# Patient Record
Sex: Female | Born: 1975 | Race: Black or African American | Hispanic: No | Marital: Single | Smoking: Current every day smoker
Health system: Southern US, Community
[De-identification: ages and names within clinical notes are randomized; demographics above are authoritative.]

## PROBLEM LIST (undated history)

## (undated) DIAGNOSIS — D259 Leiomyoma of uterus, unspecified: Secondary | ICD-10-CM

## (undated) DIAGNOSIS — O3410 Maternal care for benign tumor of corpus uteri, unspecified trimester: Secondary | ICD-10-CM

## (undated) DIAGNOSIS — I471 Supraventricular tachycardia, unspecified: Secondary | ICD-10-CM

## (undated) DIAGNOSIS — D219 Benign neoplasm of connective and other soft tissue, unspecified: Secondary | ICD-10-CM

## (undated) DIAGNOSIS — I10 Essential (primary) hypertension: Secondary | ICD-10-CM

## (undated) DIAGNOSIS — D500 Iron deficiency anemia secondary to blood loss (chronic): Secondary | ICD-10-CM

## (undated) DIAGNOSIS — I509 Heart failure, unspecified: Secondary | ICD-10-CM

## (undated) HISTORY — DX: Leiomyoma of uterus, unspecified: D25.9

## (undated) HISTORY — PX: OTHER SURGICAL HISTORY: SHX169

## (undated) HISTORY — DX: Heart failure, unspecified: I50.9

---
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
## 2020-05-01 ENCOUNTER — Other Ambulatory Visit: Payer: Self-pay

## 2020-05-01 ENCOUNTER — Emergency Department (HOSPITAL_COMMUNITY): Payer: Medicaid Other

## 2020-05-01 ENCOUNTER — Encounter (HOSPITAL_COMMUNITY): Payer: Self-pay | Admitting: Emergency Medicine

## 2020-05-01 ENCOUNTER — Inpatient Hospital Stay (HOSPITAL_COMMUNITY)
Admission: EM | Admit: 2020-05-01 | Discharge: 2020-05-03 | DRG: 308 | Disposition: A | Discharge status: Home / Self Care | Payer: Medicaid Other | Attending: Internal Medicine | Admitting: Internal Medicine

## 2020-05-01 DIAGNOSIS — I313 Pericardial effusion (noninflammatory): Secondary | ICD-10-CM | POA: Diagnosis present

## 2020-05-01 DIAGNOSIS — Z20822 Contact with and (suspected) exposure to covid-19: Secondary | ICD-10-CM | POA: Diagnosis present

## 2020-05-01 DIAGNOSIS — I161 Hypertensive emergency: Secondary | ICD-10-CM | POA: Diagnosis present

## 2020-05-01 DIAGNOSIS — N920 Excessive and frequent menstruation with regular cycle: Secondary | ICD-10-CM | POA: Diagnosis present

## 2020-05-01 DIAGNOSIS — I5021 Acute systolic (congestive) heart failure: Secondary | ICD-10-CM | POA: Diagnosis not present

## 2020-05-01 DIAGNOSIS — I5031 Acute diastolic (congestive) heart failure: Secondary | ICD-10-CM | POA: Diagnosis not present

## 2020-05-01 DIAGNOSIS — I110 Hypertensive heart disease with heart failure: Secondary | ICD-10-CM | POA: Diagnosis present

## 2020-05-01 DIAGNOSIS — R609 Edema, unspecified: Secondary | ICD-10-CM | POA: Diagnosis not present

## 2020-05-01 DIAGNOSIS — I472 Ventricular tachycardia: Secondary | ICD-10-CM | POA: Diagnosis present

## 2020-05-01 DIAGNOSIS — I509 Heart failure, unspecified: Secondary | ICD-10-CM | POA: Diagnosis not present

## 2020-05-01 DIAGNOSIS — D62 Acute posthemorrhagic anemia: Secondary | ICD-10-CM

## 2020-05-01 DIAGNOSIS — I10 Essential (primary) hypertension: Secondary | ICD-10-CM | POA: Diagnosis not present

## 2020-05-01 DIAGNOSIS — I471 Supraventricular tachycardia, unspecified: Secondary | ICD-10-CM

## 2020-05-01 DIAGNOSIS — D649 Anemia, unspecified: Secondary | ICD-10-CM | POA: Diagnosis not present

## 2020-05-01 DIAGNOSIS — I5041 Acute combined systolic (congestive) and diastolic (congestive) heart failure: Secondary | ICD-10-CM | POA: Diagnosis present

## 2020-05-01 DIAGNOSIS — D500 Iron deficiency anemia secondary to blood loss (chronic): Secondary | ICD-10-CM | POA: Diagnosis present

## 2020-05-01 DIAGNOSIS — D259 Leiomyoma of uterus, unspecified: Secondary | ICD-10-CM | POA: Diagnosis present

## 2020-05-01 HISTORY — DX: Maternal care for benign tumor of corpus uteri, unspecified trimester: O34.10

## 2020-05-01 HISTORY — DX: Supraventricular tachycardia, unspecified: I47.10

## 2020-05-01 HISTORY — DX: Essential (primary) hypertension: I10

## 2020-05-01 HISTORY — DX: Supraventricular tachycardia: I47.1

## 2020-05-01 HISTORY — DX: Iron deficiency anemia secondary to blood loss (chronic): D50.0

## 2020-05-01 LAB — IRON AND TIBC
Iron: 13 ug/dL — ABNORMAL LOW (ref 28–170)
Saturation Ratios: 3 % — ABNORMAL LOW (ref 10.4–31.8)
TIBC: 423 ug/dL (ref 250–450)
UIBC: 410 ug/dL

## 2020-05-01 LAB — SARS CORONAVIRUS 2 BY RT PCR (HOSPITAL ORDER, PERFORMED IN ~~LOC~~ HOSPITAL LAB): SARS Coronavirus 2: NEGATIVE

## 2020-05-01 LAB — TROPONIN I (HIGH SENSITIVITY)
Troponin I (High Sensitivity): 82 ng/L — ABNORMAL HIGH (ref <18)
Troponin I (High Sensitivity): 71 ng/L — ABNORMAL HIGH (ref <18)

## 2020-05-01 LAB — CBC
HCT: 17.4 % — ABNORMAL LOW (ref 36.0–46.0)
Hemoglobin: 4 g/dL — CL (ref 12.0–15.0)
MCH: 12.8 pg — ABNORMAL LOW (ref 26.0–34.0)
MCHC: 23 g/dL — ABNORMAL LOW (ref 30.0–36.0)
MCV: 55.8 fL — ABNORMAL LOW (ref 80.0–100.0)
Platelets: 332 10*3/uL (ref 150–400)
RBC: 3.12 MIL/uL — ABNORMAL LOW (ref 3.87–5.11)
RDW: 22.7 % — ABNORMAL HIGH (ref 11.5–15.5)
WBC: 4.9 10*3/uL (ref 4.0–10.5)
nRBC: 2.3 % — ABNORMAL HIGH (ref 0.0–0.2)

## 2020-05-01 LAB — FOLATE: Folate: 9 ng/mL (ref >5.9)

## 2020-05-01 LAB — BASIC METABOLIC PANEL
Anion gap: 13 (ref 5–15)
BUN: 13 mg/dL (ref 6–20)
CO2: 19 mmol/L — ABNORMAL LOW (ref 22–32)
Calcium: 9.4 mg/dL (ref 8.9–10.3)
Chloride: 107 mmol/L (ref 98–111)
Creatinine, Ser: 0.86 mg/dL (ref 0.44–1.00)
GFR calc Af Amer: >60 mL/min (ref >60)
GFR calc non Af Amer: >60 mL/min (ref >60)
Glucose, Bld: 99 mg/dL (ref 70–99)
Potassium: 3.5 mmol/L (ref 3.5–5.1)
Sodium: 139 mmol/L (ref 135–145)

## 2020-05-01 LAB — RETICULOCYTES
Immature Retic Fract: 27.3 % — ABNORMAL HIGH (ref 2.3–15.9)
RBC.: 2.93 MIL/uL — ABNORMAL LOW (ref 3.87–5.11)
Retic Count, Absolute: 53.6 10*3/uL (ref 19.0–186.0)
Retic Ct Pct: 1.8 % (ref 0.4–3.1)

## 2020-05-01 LAB — VITAMIN B12: Vitamin B-12: 608 pg/mL (ref 180–914)

## 2020-05-01 LAB — FERRITIN: Ferritin: 2 ng/mL — ABNORMAL LOW (ref 11–307)

## 2020-05-01 LAB — BRAIN NATRIURETIC PEPTIDE: B Natriuretic Peptide: 2782.5 pg/mL — ABNORMAL HIGH (ref 0.0–100.0)

## 2020-05-01 LAB — I-STAT BETA HCG BLOOD, ED (MC, WL, AP ONLY): I-stat hCG, quantitative: <5 m[IU]/mL (ref <5)

## 2020-05-01 LAB — ABO/RH: ABO/RH(D): O POS

## 2020-05-01 LAB — PREPARE RBC (CROSSMATCH)

## 2020-05-01 IMAGING — DX DG CHEST 1V PORT
1 series · 1 of 1 positions shown · non-contrast
Comparison: None.

CLINICAL DATA: Dyspnea, leg swelling

EXAM:
PORTABLE CHEST 1 VIEW

[chest ap]
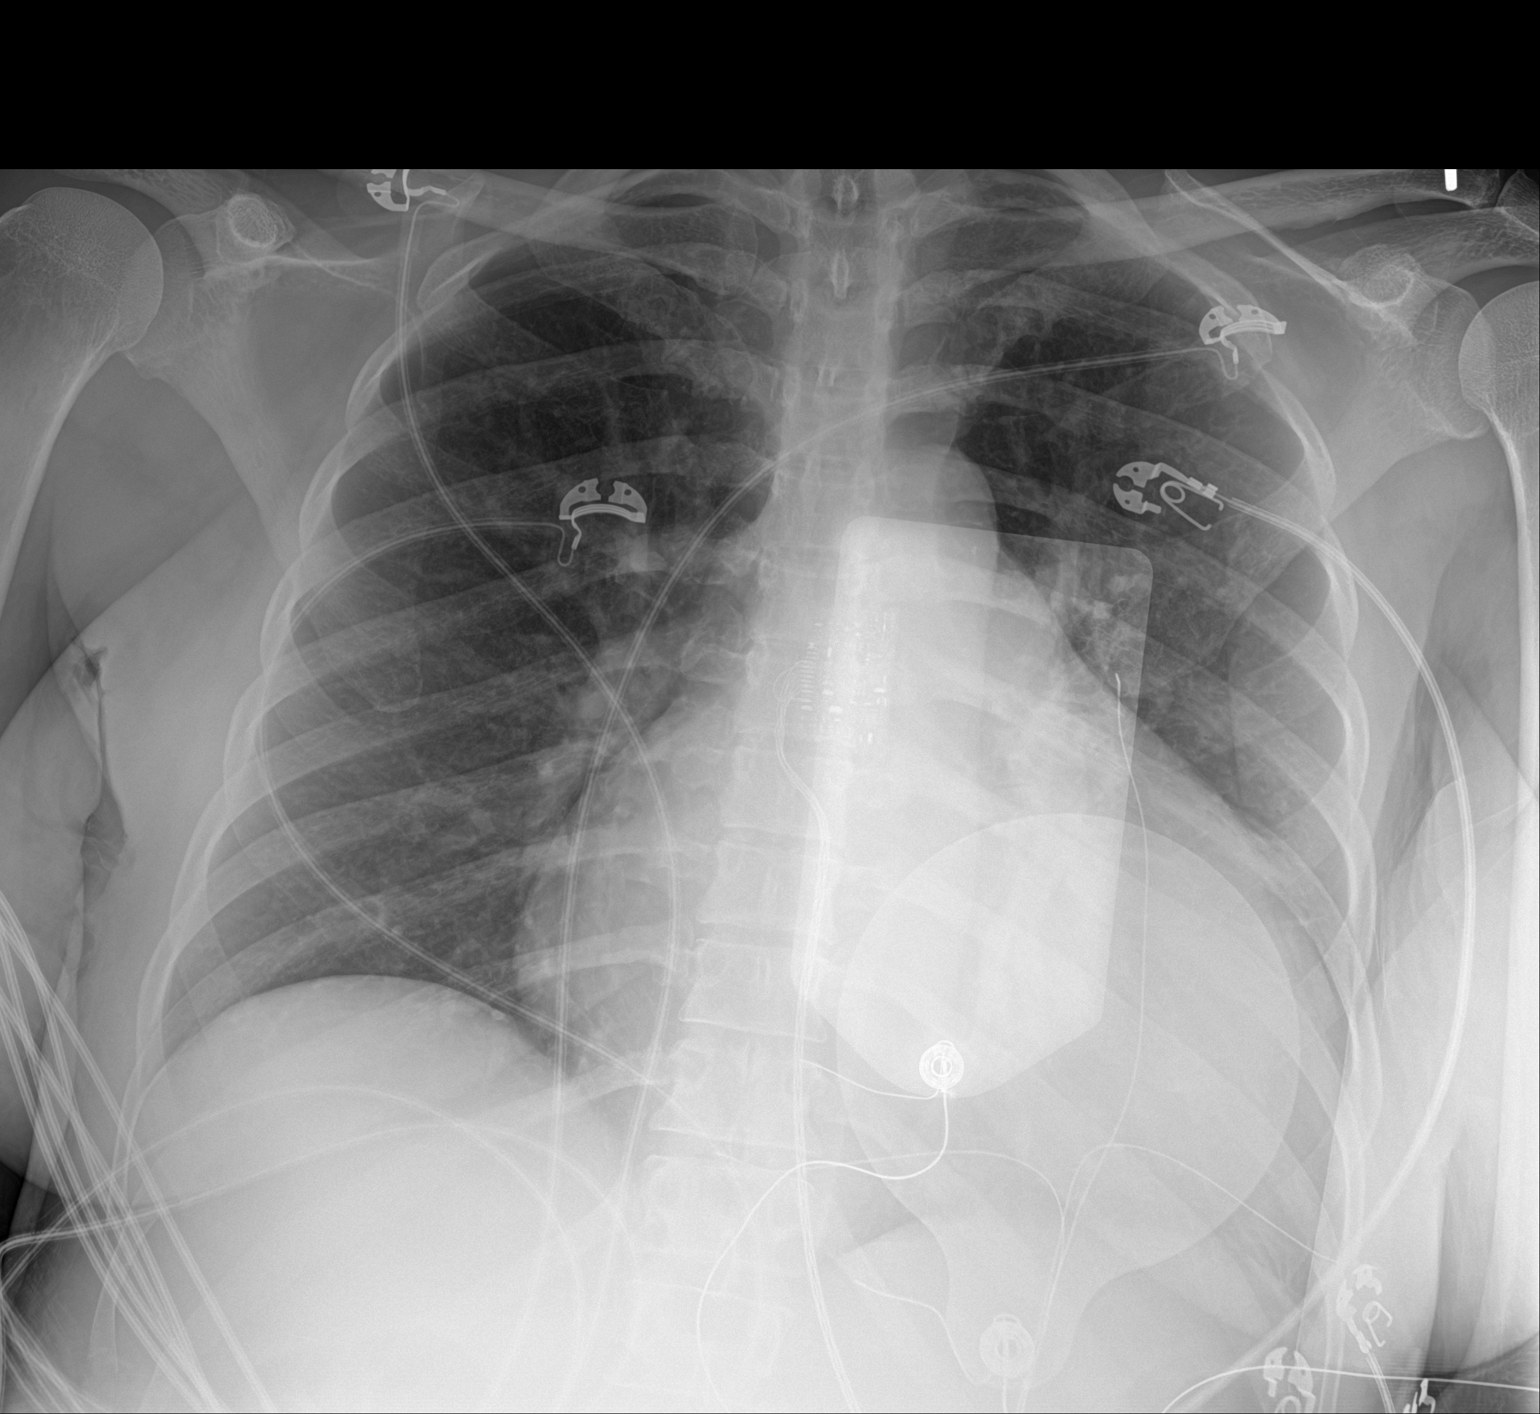

[1 of 1 positions shown; findings below may reference images not displayed]

FINDINGS: No consolidation or edema within limitation of overlying
defibrillator pads. Cardiomegaly. No significant pleural effusion.
No pneumothorax.
IMPRESSION: Clear lungs.  Cardiomegaly.

## 2020-05-01 MED ORDER — SODIUM CHLORIDE 0.9 % IV SOLN
25.0000 mg | Freq: Once | INTRAVENOUS | Status: AC
  Administered 2020-05-02: 25 mg via INTRAVENOUS
  Filled 2020-05-01: qty 2

## 2020-05-01 MED ORDER — LISINOPRIL 20 MG PO TABS
20.0000 mg | ORAL_TABLET | Freq: Every day | ORAL | Status: DC
  Administered 2020-05-02: 20 mg via ORAL
  Filled 2020-05-01: qty 1

## 2020-05-01 MED ORDER — SODIUM CHLORIDE 0.9% FLUSH
10.0000 mL | Freq: Two times a day (BID) | INTRAVENOUS | Status: DC
  Administered 2020-05-01 – 2020-05-02 (×4): 10 mL

## 2020-05-01 MED ORDER — SODIUM CHLORIDE 0.9% FLUSH: 10.0000 mL | INTRAVENOUS | Status: DC | PRN

## 2020-05-01 MED ORDER — ONDANSETRON HCL 4 MG/2ML IJ SOLN: 4.0000 mg | Freq: Four times a day (QID) | INTRAMUSCULAR | Status: DC | PRN

## 2020-05-01 MED ORDER — SODIUM CHLORIDE 0.9 % IV BOLUS
500.0000 mL | Freq: Once | INTRAVENOUS | Status: AC
  Administered 2020-05-01: 500 mL via INTRAVENOUS

## 2020-05-01 MED ORDER — FUROSEMIDE 10 MG/ML IJ SOLN: 40.0000 mg | Freq: Every day | INTRAMUSCULAR | Status: DC

## 2020-05-01 MED ORDER — LABETALOL HCL 5 MG/ML IV SOLN
10.0000 mg | INTRAVENOUS | Status: AC | PRN
  Administered 2020-05-01 (×2): 10 mg via INTRAVENOUS
  Filled 2020-05-01: qty 4

## 2020-05-01 MED ORDER — POTASSIUM CHLORIDE CRYS ER 20 MEQ PO TBCR
40.0000 meq | EXTENDED_RELEASE_TABLET | Freq: Once | ORAL | Status: AC
  Administered 2020-05-01: 40 meq via ORAL
  Filled 2020-05-01: qty 2

## 2020-05-01 MED ORDER — ACETAMINOPHEN 325 MG PO TABS: 650.0000 mg | ORAL_TABLET | ORAL | Status: DC | PRN

## 2020-05-01 MED ORDER — SODIUM CHLORIDE 0.9% FLUSH: 3.0000 mL | INTRAVENOUS | Status: DC | PRN

## 2020-05-01 MED ORDER — CHLORHEXIDINE GLUCONATE CLOTH 2 % EX PADS: 6.0000 | MEDICATED_PAD | Freq: Every day | CUTANEOUS | Status: DC

## 2020-05-01 MED ORDER — ADENOSINE 6 MG/2ML IV SOLN
6.0000 mg | Freq: Once | INTRAVENOUS | Status: AC
  Administered 2020-05-01: 6 mg via INTRAVENOUS
  Filled 2020-05-01: qty 2

## 2020-05-01 MED ORDER — SODIUM CHLORIDE 0.9 % IV SOLN: 250.0000 mL | INTRAVENOUS | Status: DC | PRN

## 2020-05-01 MED ORDER — SODIUM CHLORIDE 0.9 % IV SOLN
10.0000 mL/h | Freq: Once | INTRAVENOUS | Status: AC
  Administered 2020-05-01: 10 mL/h via INTRAVENOUS

## 2020-05-01 MED ORDER — SODIUM CHLORIDE 0.9% FLUSH
3.0000 mL | Freq: Once | INTRAVENOUS | Status: AC
  Administered 2020-05-01: 3 mL via INTRAVENOUS

## 2020-05-01 MED ORDER — FUROSEMIDE 10 MG/ML IJ SOLN
40.0000 mg | Freq: Once | INTRAMUSCULAR | Status: AC
  Administered 2020-05-01: 40 mg via INTRAVENOUS
  Filled 2020-05-01: qty 4

## 2020-05-01 MED ORDER — METOPROLOL TARTRATE 5 MG/5ML IV SOLN
2.5000 mg | INTRAVENOUS | Status: DC | PRN
  Administered 2020-05-01: 2.5 mg via INTRAVENOUS
  Filled 2020-05-01: qty 5

## 2020-05-01 MED ORDER — ENOXAPARIN SODIUM 40 MG/0.4ML ~~LOC~~ SOLN
40.0000 mg | SUBCUTANEOUS | Status: DC
  Administered 2020-05-01 – 2020-05-02 (×2): 40 mg via SUBCUTANEOUS
  Filled 2020-05-01 (×2): qty 0.4

## 2020-05-01 MED ORDER — SODIUM CHLORIDE 0.9% FLUSH
3.0000 mL | Freq: Two times a day (BID) | INTRAVENOUS | Status: DC
  Administered 2020-05-02 – 2020-05-03 (×3): 3 mL via INTRAVENOUS

## 2020-05-01 MED ORDER — CARVEDILOL 6.25 MG PO TABS
6.2500 mg | ORAL_TABLET | Freq: Two times a day (BID) | ORAL | Status: DC
  Administered 2020-05-01 – 2020-05-02 (×3): 6.25 mg via ORAL
  Filled 2020-05-01 (×3): qty 1

## 2020-05-01 NOTE — Consult Note (Addendum)
Cardiology Consultation:   Holly Bradley ID: Lenabelle Rosten; [id number]; August 29, 1976   Admit date: 05/01/2020 Date of Consult: 05/01/2020  Primary Care Provider: Patient, No Pcp Per Primary Cardiologist: New to [facility]   Holly Bradley Profile:   Holly Bradley is a 44 y.o. Bradley with a hx of SVT, HTN, and uterine fibroids with acute on chronic chronic anemia who is being seen today for Holly evaluation of SVT and acute CHF at Holly request of Dr. Regenia Skeeter.  History of Present Illness:   Holly Bradley is a 44 year old Bradley with a history stated above who presented to [facility] on 05/01/2020 with palpitations and shortness of breath which began approximately 8 AM this morning. Holly Bradley reporyts that Holly Bradley recently moved here about one month ago from [place] to be closer to family. Holly Bradley was previously followed by cardiology in [place] for SVT for which Holly Bradley was dx about one year ago. Holly Bradley has been poorly controlled with nifidipine and no other medications. Holly Bradley states that Holly Bradley recently underwent an echo 2 months ago which was normal however Holly Bradley was told that Holly Bradley had "fluid around her heart ". Sh e reports that Holly Bradley is very symptomatic when Holly Bradley is in SVT with SOB and palpitations. Holly Bradley states that Holly Bradley will go into it sometimes for 5 minutes and it will break and other times it may last 4 hours or more. Her BP is elevated as well. Does not appear that Holly Bradley knows what her baseline BP is. Holly Bradley has a hx of symptomatic anemia secondary to bleeding uterine fibroids for which Holly Bradley has received transfusions in Holly past. Holly Bradley denies chest pain, dizziness or syncope. Does have LE edema on exam. Has no prior hx of CAD.   On ED arrival, Holly Bradley was found to be in SVT and otherwise stable condition.  Hemoglobin on arrival found to be 4.0.  HST was mildly elevated at 82.  BNP markedly elevated at 2782.  CXR with clear lungs and cardiomegaly with no significant pleural effusion or no pneumothorax.  Holly Bradley was typed and crossed and has been transfused with PRBCs. BP is  markedly elevated with SBPs in Holly 180's with unknown baseline.   IM to admit with cardiology to follow   Past Medical History:  Diagnosis Date  . Chronic blood loss anemia    Related to uterine fibroids  . Essential hypertension    Poorly controlled, repeat by report only on nicardipine 90 mg  . Paroxysmal SVT (supraventricular tachycardia) (HCC)    Frequent episodes, can last anywhere from 20 minutes to 12-15 hours.  Not on beta-blocker or calcium channel blocker  . Uterine fibroids affecting pregnancy    Likely complicated by by menometrorrhagia and chronic anemia    Holly histories are not reviewed yet. Please review them in Holly "History" navigator section and refresh this [place].   Prior to Admission medications   Medication Sig Start Date End Date Taking? Authorizing Provider  NIFEdipine (ADALAT CC) 90 MG 24 hr tablet Take 90 mg by mouth daily. 03/23/20  Yes [provider]    Inpatient Medications: Scheduled Meds: . carvedilol  6.25 mg Oral BID WC  . Chlorhexidine Gluconate Cloth  6 each Topical Daily  . enoxaparin (LOVENOX) injection  40 mg Subcutaneous Q24H  . [START ON 05/02/2020] furosemide  40 mg Intravenous Daily  . lisinopril  20 mg Oral Daily  . sodium chloride flush  10-40 mL Intracatheter Q12H  . sodium chloride flush  3 mL Intravenous Q12H   Continuous Infusions: .  sodium chloride    . [START ON 05/02/2020] ferric gluconate (FERRLECIT/NULECIT) Test Dose     PRN Meds: sodium chloride, acetaminophen, metoprolol tartrate, ondansetron (ZOFRAN) IV, sodium chloride flush, sodium chloride flush  Allergies:   No Known Allergies  Social History:   Social History   Socioeconomic History  . Marital status: Single    Spouse name: Not on file  . Number of children: Not on file  . Years of education: Not on file  . Highest education level: Not on file  Occupational History  . Not on file  Tobacco Use  . Smoking status: Not on file  Substance and Sexual  Activity  . Alcohol use: Not on file  . Drug use: Not on file  . Sexual activity: Not on file  Other Topics Concern  . Not on file  Social History Narrative  . Not on file   Social Determinants of Health   Financial Resource Strain:   . Difficulty of Paying Living Expenses:   Food Insecurity:   . Worried About Charity fundraiser in [place] Last Year:   . Arboriculturist in [place] Last Year:   Transportation Needs:   . Film/video editor (Medical):   Marland Kitchen Lack of Transportation (Non-Medical):   Physical Activity:   . Days of Exercise per Week:   . Minutes of Exercise per Session:   Stress:   . Feeling of Stress :   Social Connections:   . Frequency of Communication with Friends and Family:   . Frequency of Social Gatherings with Friends and Family:   . Attends Religious Services:   . Active Member of Clubs or Organizations:   . Attends Archivist Meetings:   Marland Kitchen Marital Status:   Intimate Partner Violence:   . Fear of Current or Ex-Partner:   . Emotionally Abused:   Marland Kitchen Physically Abused:   . Sexually Abused:     Family History:   Family History  Family history unknown: Yes   Family Status:  No family status information on file.    ROS: Please see Holly history of present illness.  Review of Systems  Constitutional: Positive for malaise/fatigue. Negative for chills, fever and weight loss.  HENT: Negative for nosebleeds.   Respiratory: Positive for shortness of breath. Negative for cough.   Cardiovascular: Positive for palpitations (Has had at least 2 prolonged episodes of SVT in Holly last 2 weeks.), orthopnea and leg swelling.  Gastrointestinal: Negative for melena.  Genitourinary: Negative for frequency and hematuria.       Heavy flow menses  Musculoskeletal: Negative for joint pain.       Legs hurt with walking due to edema; generalized body aches  Neurological: Positive for dizziness.  Psychiatric/Behavioral: Negative.   All other systems reviewed and are  negative.       Physical Exam/Data:   Vitals:   05/01/20 2245 05/01/20 2300 05/01/20 2315 05/01/20 2330  BP: (!) 179/104 (!) 168/97 (!) 167/99 (!) 168/90  Pulse: 94 83 82 82  Resp: 17 11 (!) 22 12  Temp:      TempSrc:      SpO2: 100% 100% 98% 100%  Weight:      Height:        Intake/Output Summary (Last 24 hours) at 05/01/2020 2341 Last data filed at 05/01/2020 1855 Gross per 24 hour  Intake 315 ml  Output 700 ml  Net -385 ml   Filed Weights   05/01/20 1233  Weight: 68  kg   Body mass index is 24.21 kg/m.   General: Well developed, well nourished, Holly Bradley does appear to be in modest distress.  Uncomfortable, fatigued but nontoxic. Neck: Negative for carotid bruits. No JVD-difficult to assess due to tachycardia. Lungs: Diminished in bilateral bases with mild rhonchi/rales. Breathing is unlabored. Cardiovascular: Tachycardic, RRR with S1 S2. No M/R/G. Abdomen: Soft, mildly tender-tender, non-distended. No obvious abdominal masses. Extremities:2+ BLE edema to bilateral knees. Radial  pulses 2+ bilaterally Neuro: Alert and oriented. No focal deficits. No facial asymmetry. MAE spontaneously. Psych: Responds to questions appropriately with normal affect.     EKG:  Holly EKG was personally reviewed and demonstrates:  05/01/20 SVT with HR 163bpm Telemetry:  Telemetry was personally reviewed and demonstrates:  ST with HR in Holly 100's   Relevant CV Studies:  ECHO: Pending    Laboratory Data:  Chemistry Recent Labs  Lab 05/01/20 1143  NA 139  K 3.5  CL 107  CO2 19*  GLUCOSE 99  BUN 13  CREATININE 0.86  CALCIUM 9.4  GFRNONAA >60  GFRAA >60  ANIONGAP 13    No results found for: PROT, ALBUMIN, AST, ALT, ALKPHOS, BILITOT Hematology Recent Labs  Lab 05/01/20 1143 05/01/20 1251  WBC 4.9  --   RBC 3.12* 2.93*  HGB 4.0*  --   HCT 17.4*  --   MCV 55.8*  --   MCH 12.8*  --   MCHC 23.0*  --   RDW 22.7*  --   PLT 332  --    Cardiac EnzymesNo results for input(s):  TROPONINI in Holly last 168 hours. No results for input(s): TROPIPOC in Holly last 168 hours.  BNP Recent Labs  Lab 05/01/20 1143  BNP 2,782.5*    DDimer No results for input(s): DDIMER in Holly last 168 hours. TSH: No results found for: TSH Lipids:No results found for: CHOL, HDL, LDLCALC, LDLDIRECT, TRIG, CHOLHDL HgbA1c:No results found for: HGBA1C  Radiology/Studies:  DG Chest Portable 1 View  Result Date: 05/01/2020 CLINICAL DATA:  Dyspnea, leg swelling EXAM: PORTABLE CHEST 1 VIEW COMPARISON:  None. FINDINGS: No consolidation or edema within limitation of overlying defibrillator pads. Cardiomegaly. No significant pleural effusion. No pneumothorax. IMPRESSION: Clear lungs.  Cardiomegaly. Electronically Signed   By: Macy Mis M.D.   On: 05/01/2020 14:45    Assessment and Plan:   1.  Acute CHF (unsure if combined systolic and diastolic versus simply diastolic) in Holly setting of uncontrolled SVT: High-output heart failure from combination of prolonged SVT  and severe anemia. -Holly Bradley presented with palpitations and shortness of breath found to be in SVT per EKG.  Holly Bradley was given adenosine with conversion to ST with rates in Holly 100's. Unfortunately, Holly Bradley found to be markedly anemic with a hemoglobin of 4.0.  Holly Bradley has been transfused with PRBCs -BNP markedly elevated at 2782 -CXR with no pulmonary edema-however Holly Bradley does seem to have some rales on exam. -HST mildly elevated at 71 likely in Holly setting of acute fluid volume overload -We will plan for echocardiogram to fully assess LV function (would like to have heart rate slightly slower) -HF symptoms most likely in Holly setting of severe anemia and SVT -Continue to treat anemia with PRBCs.   -Would recommend IV Lasix 40mg  now and in between each unit of blood. -Reassess in Holly morning. -Once more stable from an anemia standpoint, would consider adding BB for SVT -ACEI added for better BP control in Holly setting of stable renal function -  would also  restart home CCB for significant HTN (likely adding to diastolic dysfunction)   2.  SVT: -As above, Holly Bradley presented with shortness of breath and palpitations found to be in SVT on ED arrival.  This is an ongoing issue for Holly Bradley.  Reports symptoms approximately every 2 weeks for which Holly Bradley is controlled with nifedipine 90 mg p.o. daily -Holly Bradley was given Adenosine with conversion to sinus tachycardia -Holly Bradley reports shortness of breath has improved since conversion -Likely symptoms will settle with treating anemia with infusions  -Will plan for BB once fluid volume more stable  -Obtain echocardiogram as above -Plan for EP referral for possible SVT ablation --> with frequent recurrent episodes complicated by her anemia, I do think that consideration of ablation is a reasonable option.  We will discuss with EP in Holly morning.  3.  Symptomatic anemia: -On arrival, hemoglobin found to be 4.0>>>hax hc of uterine fibroids with chronic anemia  -Reports prior blood transfusions for chronic anemia in Holly setting of heavy vaginal bleeding from fibroids -Will need to establish with GYN now that Holly Bradley has moved to [facility]  -Per IM  4. HTN: -Elevated today -Will add ACE inhibitor for greater BP control; would also restart home dose calcium channel blocker. -> Likely start either beta-blocker or convert to nondihydropyridine calcium channel blocker for rate control with SVT . -Renal function stable    For questions or updates, please contact [place] Please consult [email or web address] for contact info under Cardiology/STEMI.   SignedKathyrn Drown NP-C [facility] Pager: [phone] 05/01/2020 11:41 PM   ATTENDING ATTESTATION  I have seen, examined and evaluated Holly Bradley this PM in Holly ER along with Kathyrn Drown, NP-C.  After reviewing all Holly available data and chart, we discussed Holly patients laboratory, study & physical findings as well as symptoms in detail. I agree with her  findings, examination as well as impression recommendations as per our discussion.    Attending adjustments noted in italics.   Basically 44 year old Holly Bradley in significant distress following prolonged episode of SVT likely exacerbated or caused by acute on chronic anemia with hemoglobin of 4.  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  Holly Bradley needs diuresis but Holly Bradley also needs blood.  Agree with dosing Lasix intermittent between units of blood.  Holly Bradley probably needs significant diuresis in addition to Holly blood ((may need more beyond transfusions).  Needs blood pressure control.  I agree with adding ACE inhibitor but but also probably restart her home dose of calcium channel blocker.  Holly Bradley will eventually need to be on some type of rate control agent either beta-blocker or not dihydropyridine calcium channel blocker such as diltiazem.  Ultimately from a cardiac standpoint, I think Holly Bradley would benefit from SVT ablation.  We will discuss with EP tomorrow.     Glenetta Hew, M.D., M.S. Interventional Cardiologist   Pager # [phone] Phone # [phone] [address] [place], [place] [address]

## 2020-05-01 NOTE — ED Provider Notes (Signed)
[facility] EMERGENCY DEPARTMENT Provider Note   CSN: [id number] Arrival date & time: 05/01/20  1123     History Chief Complaint  Patient presents with  . Shortness of Breath    Holly Bradley is a 44 y.o. female.  HPI 44 year old female presents with palpitations and shortness of breath.  Started around 8 AM.  Is a recurrent issue for her.  She states she has a history of SVT and goes in and out quite often.  Did this earlier in the week.  However typically she has never had to come to the ER. Usually happens every 2 weeks or so. She has been having leg swelling for a month as well which is getting worse.  Think she might be dehydrated as she has not had as much to eat because of chronic abdominal issues.  Is having a little bit of chest discomfort right now. Saw a cardiologist in [place] and states she had fluid around her heart and was placed on nifedipine for BP.   History reviewed. No pertinent past medical history.  There are no problems to display for this patient.      OB History   No obstetric history on file.     No family history on file.  Social History   Tobacco Use  . Smoking status: Not on file  Substance Use Topics  . Alcohol use: Not on file  . Drug use: Not on file    Home Medications Prior to Admission medications   Medication Sig Start Date End Date Taking? Authorizing Provider  NIFEdipine (ADALAT CC) 90 MG 24 hr tablet Take 90 mg by mouth daily. 03/23/20  Yes [provider]    Allergies    Patient has no known allergies.  Review of Systems   Review of Systems  Respiratory: Positive for shortness of breath.   Cardiovascular: Positive for chest pain, palpitations and leg swelling.  All other systems reviewed and are negative.   Physical Exam Updated Vital Signs BP (!) 184/104   Pulse (!) 111   Temp 99.3 F (37.4 C) (Oral)   Resp (!) 24   Ht 5\' 6"  (1.676 m)   Wt 68 kg   SpO2 100%   BMI 24.21 kg/m    Physical Exam Vitals and nursing note reviewed.  Constitutional:      General: She is not in acute distress.    Appearance: She is well-developed.  HENT:     Head: Normocephalic and atraumatic.     Right Ear: External ear normal.     Left Ear: External ear normal.     Nose: Nose normal.  Eyes:     General:        Right eye: No discharge.        Left eye: No discharge.  Cardiovascular:     Rate and Rhythm: Regular rhythm. Tachycardia present.     Heart sounds: Normal heart sounds.  Pulmonary:     Effort: Pulmonary effort is normal.     Breath sounds: Normal breath sounds.  Abdominal:     Palpations: Abdomen is soft.     Tenderness: There is no abdominal tenderness.  Musculoskeletal:     Right lower leg: Edema present.     Left lower leg: Edema present.     Comments: BLE pitting edema  Skin:    General: Skin is warm and dry.  Neurological:     Mental Status: She is alert.  Psychiatric:  Mood and Affect: Mood is not anxious.     ED Results / Procedures / Treatments   Labs (all labs ordered are listed, but only abnormal results are displayed) Labs Reviewed  BASIC METABOLIC PANEL - Abnormal; Notable for the following components:      Result Value   CO2 19 (*)    All other components within normal limits  CBC - Abnormal; Notable for the following components:   RBC 3.12 (*)    Hemoglobin 4.0 (*)    HCT 17.4 (*)    MCV 55.8 (*)    MCH 12.8 (*)    MCHC 23.0 (*)    RDW 22.7 (*)    nRBC 2.3 (*)    All other components within normal limits  BRAIN NATRIURETIC PEPTIDE - Abnormal; Notable for the following components:   B Natriuretic Peptide 2,782.5 (*)    All other components within normal limits  IRON AND TIBC - Abnormal; Notable for the following components:   Iron 13 (*)    Saturation Ratios 3 (*)    All other components within normal limits  FERRITIN - Abnormal; Notable for the following components:   Ferritin 2 (*)    All other components within normal  limits  RETICULOCYTES - Abnormal; Notable for the following components:   RBC. 2.93 (*)    Immature Retic Fract 27.3 (*)    All other components within normal limits  TROPONIN I (HIGH SENSITIVITY) - Abnormal; Notable for the following components:   Troponin I (High Sensitivity) 82 (*)    All other components within normal limits  TROPONIN I (HIGH SENSITIVITY) - Abnormal; Notable for the following components:   Troponin I (High Sensitivity) 71 (*)    All other components within normal limits  SARS CORONAVIRUS 2 BY RT PCR (HOSPITAL ORDER, [facility])  VITAMIN B12  FOLATE  I-STAT BETA HCG BLOOD, ED ([facility], [facility], [facility] ONLY)  TYPE AND SCREEN  PREPARE RBC (CROSSMATCH)  ABO/RH    EKG EKG Interpretation  Date/Time:  Monday May 01 2020 11:25:23 EDT Ventricular Rate:  163 PR Interval:    QRS Duration: 90 QT Interval:  286 QTC Calculation: 470 R Axis:   106 Text Interpretation: Supraventricular tachycardia Rightward axis Septal infarct , age undetermined Marked ST abnormality, possible inferior subendocardial injury Abnormal ECG No old tracing to compare Confirmed by Sherwood Gambler [phone]) on 05/01/2020 11:46:39 AM     Radiology DG Chest Portable 1 View  Result Date: 05/01/2020 CLINICAL DATA:  Dyspnea, leg swelling EXAM: PORTABLE CHEST 1 VIEW COMPARISON:  None. FINDINGS: No consolidation or edema within limitation of overlying defibrillator pads. Cardiomegaly. No significant pleural effusion. No pneumothorax. IMPRESSION: Clear lungs.  Cardiomegaly. Electronically Signed   By: Macy Mis M.D.   On: 05/01/2020 14:45    Procedures .Critical Care Performed by: Sherwood Gambler, MD Authorized by: Sherwood Gambler, MD   Critical care provider statement:    Critical care time (minutes):  45   Critical care time was exclusive of:  Separately billable procedures and treating other patients   Critical care was necessary to treat or prevent imminent or life-threatening  deterioration of the following conditions:  Cardiac failure and circulatory failure   Critical care was time spent personally by me on the following activities:  Discussions with consultants, evaluation of patient's response to treatment, examination of patient, ordering and performing treatments and interventions, ordering and review of laboratory studies, ordering and review of radiographic studies, pulse oximetry, re-evaluation  of patient's condition, obtaining history from patient or surrogate and review of old charts Ultrasound ED Peripheral IV (Provider)  Date/Time: 05/01/2020 3:07 PM Performed by: Sherwood Gambler, MD Authorized by: Sherwood Gambler, MD   Procedure details:    Indications: multiple failed IV attempts and poor IV access     Skin Prep: isopropyl alcohol     Location:  Right AC   Angiocath:  20 G   Bedside Ultrasound Guided: Yes     Patient tolerated procedure without complications: Yes     Dressing applied: Yes   Ultrasound ED Echo  Date/Time: 05/01/2020 3:07 PM Performed by: Sherwood Gambler, MD Authorized by: Sherwood Gambler, MD   Procedure details:    Indications: dyspnea     Views: subxiphoid     Images: archived     Limitations:  Acoustic shadowing Findings:    Pericardium: small pericardial effusion     RV Diameter: normal   Impression:    Impression: pericardial effusion present     (including critical care time)  Medications Ordered in ED Medications  0.9 %  sodium chloride infusion (has no administration in time range)  furosemide (LASIX) injection 40 mg (has no administration in time range)  sodium chloride flush (NS) 0.9 % injection 10-40 mL (10 mLs Intracatheter Given by Other 05/01/20 1443)  sodium chloride flush (NS) 0.9 % injection 10-40 mL (has no administration in time range)  Chlorhexidine Gluconate Cloth 2 % PADS 6 each (has no administration in time range)  sodium chloride flush (NS) 0.9 % injection 3 mL (3 mLs Intravenous Given 05/01/20  1227)  adenosine (ADENOCARD) 6 MG/2ML injection 6 mg (6 mg Intravenous Given 05/01/20 1227)  sodium chloride 0.9 % bolus 500 mL (0 mLs Intravenous Stopped 05/01/20 1444)    ED Course  I have reviewed the triage vital signs and the nursing notes.  Pertinent labs & imaging results that were available during my care of the patient were reviewed by me and considered in my medical decision making (see chart for details).    MDM Rules/Calculators/A&P                      Patient presents in SVT.  She is otherwise stable.  Given concern for congestive heart failure based on presentation and exam, she was given adenosine instead of Cardizem.  This did convert her to sinus rhythm.  She remains mildly tachycardic.  Labs also show significant anemia with hemoglobin of 4.  Further history of the sedates that she has had blood transfusions before and has chronic anemia from heavy vaginal bleeding from fibroids.  She denies any rectal bleeding and declines rectal exam.  No chest pain.  She is feeling better with conversion of her heart rate but she still has shortness of breath and her orthopnea and leg swelling seem consistent with CHF.  Bedside ultrasound does not show a pericardial effusion or at least a significant 1.  She will need admission and cardiology and hospitalist were consulted. Final Clinical Impression(s) / ED Diagnoses Final diagnoses:  SVT (supraventricular tachycardia) (HCC)  Acute congestive heart failure, unspecified heart failure type (HCC)  Symptomatic anemia    Rx / DC Orders ED Discharge Orders    None       Sherwood Gambler, MD 05/01/20 1558

## 2020-05-01 NOTE — ED Notes (Signed)
Dinner ordered 

## 2020-05-01 NOTE — ED Notes (Signed)
IV Team at bedside 

## 2020-05-01 NOTE — H&P (Signed)
History and Physical    Holly Bradley [id number] DOB: 1976-05-24 DOA: 05/01/2020  PCP: Patient, No Pcp Per   Patient coming from: SOB, palpitations  I have personally briefly reviewed patient's old medical records in [place]  Chief Complaint: SOB, palpiations  HPI: Holly Bradley is a 44 y.o. female with medical history significant for paroxysmal SVT, HTN, and uterine fibroids with chronic anemia presented with increasing short of breath and palpitations.  Patient was diagnosed with SVT about 2 years ago in [place], she is been following her cardiologist there since, has been on nifedipine therapy with poor control.  She recently traveled to [place], she reported 2-3 times a week flareup of SVT when she will feel palpitations and short of breath, usually lasts up for 1 to 2 hours subsided with bedrest.  She also complains about increasing leg swelling, not short of breath associated with exertion.  Denies any cough no chest pain no fever chills.  Denies any urine changes no diarrhea.  States she has another flareup with feeling of extreme palpitations and short of breath.  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  Her OB/GYN in [place] proposed hysterectomy recently however patient yet to make decision. ED Course: Telemetry monitor shows SVT, 1 dose of adenosine given, heart rate became more controlled.  However blood pressure still significantly elevated.  Hemoglobin 4.0  Review of Systems: As per HPI otherwise 10 point review of systems negative.    History reviewed.  Past medical history mentioned in HPI     has no history on file for tobacco, alcohol, and drug.  No Known Allergies  Family history, denies any hypertension or arrhythmia run in the family   Prior to Admission medications   Medication Sig Start Date  End Date Taking? Authorizing Provider  NIFEdipine (ADALAT CC) 90 MG 24 hr tablet Take 90 mg by mouth daily. 03/23/20  Yes [provider]    Physical Exam: Vitals:   05/01/20 1645 05/01/20 1651 05/01/20 1655 05/01/20 1730  BP: (!) 190/95 (!) 183/93 (!) 181/92 (!) 189/98  Pulse: (!) 106 (!) 106 (!) 104   Resp: 19 19 20  (!) 27  Temp: (!) 100.5 F (38.1 C) 99.8 F (37.7 C) 99.6 F (37.6 C)   TempSrc: Oral Oral    SpO2: 100% 100% 100%   Weight:      Height:        Constitutional: NAD, calm, comfortable Vitals:   05/01/20 1645 05/01/20 1651 05/01/20 1655 05/01/20 1730  BP: (!) 190/95 (!) 183/93 (!) 181/92 (!) 189/98  Pulse: (!) 106 (!) 106 (!) 104   Resp: 19 19 20  (!) 27  Temp: (!) 100.5 F (38.1 C) 99.8 F (37.7 C) 99.6 F (37.6 C)   TempSrc: Oral Oral    SpO2: 100% 100% 100%   Weight:      Height:       Eyes: PERRL, lids and conjunctivae normal ENMT: Mucous membranes are moist. Posterior pharynx clear of any exudate or lesions.Normal dentition.  Neck: normal, supple, no masses, no thyromegaly Respiratory: clear to auscultation bilaterally, no wheezing, no crackles. Normal respiratory effort. No accessory muscle use.  Cardiovascular: Regular rate and rhythm, no murmurs / rubs / gallops.  2+ extremity edema. 2+ pedal pulses. No carotid bruits.  Abdomen: no tenderness, no masses palpated. No hepatosplenomegaly. Bowel sounds  positive.  Musculoskeletal: no clubbing / cyanosis. No joint deformity upper and lower extremities. Good ROM, no contractures. Normal muscle tone.  Skin: no rashes, lesions, ulcers. No induration Neurologic: CN 2-12 grossly intact. Sensation intact, DTR normal. Strength 5/5 in all 4.  Psychiatric: Normal judgment and insight. Alert and oriented x 3. Normal mood.     Labs on Admission: I have personally reviewed following labs and imaging studies  CBC: Recent Labs  Lab 05/01/20 1143  WBC 4.9  HGB 4.0*  HCT 17.4*  MCV 55.8*  PLT [id number]    Basic Metabolic Panel: Recent Labs  Lab 05/01/20 1143  NA 139  K 3.5  CL 107  CO2 19*  GLUCOSE 99  BUN 13  CREATININE 0.86  CALCIUM 9.4   GFR: Estimated Creatinine Clearance: 76.5 mL/min (by C-G formula based on SCr of 0.86 mg/dL). Liver Function Tests: No results for input(s): AST, ALT, ALKPHOS, BILITOT, PROT, ALBUMIN in the last 168 hours. No results for input(s): LIPASE, AMYLASE in the last 168 hours. No results for input(s): AMMONIA in the last 168 hours. Coagulation Profile: No results for input(s): INR, PROTIME in the last 168 hours. Cardiac Enzymes: No results for input(s): CKTOTAL, CKMB, CKMBINDEX, TROPONINI in the last 168 hours. BNP (last 3 results) No results for input(s): PROBNP in the last 8760 hours. HbA1C: No results for input(s): HGBA1C in the last 72 hours. CBG: No results for input(s): GLUCAP in the last 168 hours. Lipid Profile: No results for input(s): CHOL, HDL, LDLCALC, TRIG, CHOLHDL, LDLDIRECT in the last 72 hours. Thyroid Function Tests: No results for input(s): TSH, T4TOTAL, FREET4, T3FREE, THYROIDAB in the last 72 hours. Anemia Panel: Recent Labs    05/01/20 1251  VITAMINB12 608  FOLATE 9.0  FERRITIN 2*  TIBC 423  IRON 13*  RETICCTPCT 1.8   Urine analysis: No results found for: COLORURINE, APPEARANCEUR, LABSPEC, PHURINE, GLUCOSEU, HGBUR, BILIRUBINUR, KETONESUR, PROTEINUR, UROBILINOGEN, NITRITE, LEUKOCYTESUR  Radiological Exams on Admission: DG Chest Portable 1 View  Result Date: 05/01/2020 CLINICAL DATA:  Dyspnea, leg swelling EXAM: PORTABLE CHEST 1 VIEW COMPARISON:  None. FINDINGS: No consolidation or edema within limitation of overlying defibrillator pads. Cardiomegaly. No significant pleural effusion. No pneumothorax. IMPRESSION: Clear lungs.  Cardiomegaly. Electronically Signed   By: Macy Mis M.D.   On: 05/01/2020 14:45    EKG: Independently reviewed. SVT  Assessment/Plan Active Problems:   SVT (supraventricular  tachycardia) (HCC)   CHF (congestive heart failure) (HCC)  Paroxysmal SVT -We will start Coreg to address both heart rate and blood pressure issue, will add as needed metoprolol for heart rate breakthrough -Check TSH -She never had EP study which to be considered  Uncontrolled hypertension/hypertensive emergency -Aiming at bringing down blood pressure above 25% tonight -Discontinue nifedipine -Start p.o. HCTZ, lisinopril, 1 dose of IV Lasix given in the ED  Probably diastolic CHF -1 dose of Lasix -Control blood pressure -Check echo  Symptomatic anemia secondary to menorrhagia, chronic iron deficiency -2 unit PRBC, 1 dose IV Lasix in between each unit -IV iron 1 dose tomorrow and p.o. iron on discharge -Inappropriate reticulocyte count, implying like response from bone marrow, outpatient hematology follow-up -Outpatient OB/GYN follow-up in [place]    DVT prophylaxis: Lovenox  code Status: Full code Family Communication: None at bedside Disposition Plan: Complicated medical problems, likely will need more than 2 midnight hospital stay Consults called: Cardiology Admission status: Telemetry admission   Lequita Halt MD [facility] Pager [phone]    05/01/2020, 6:46 PM

## 2020-05-01 NOTE — ED Triage Notes (Addendum)
Pt arrives to ED with c/o of heart racing sob and cp that started this morning.  Heart rate 160 pt reports 1 month she was told she had svt and "fluid around her heart".

## 2020-05-01 NOTE — ED Notes (Signed)
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 Blood bank holding blood in order to contact previous facility. MD aware.

## 2020-05-01 NOTE — ED Notes (Signed)
Critical Hgb reported to Dr. Regenia Skeeter

## 2020-05-02 ENCOUNTER — Encounter (HOSPITAL_COMMUNITY): Payer: Self-pay | Admitting: Internal Medicine

## 2020-05-02 ENCOUNTER — Inpatient Hospital Stay (HOSPITAL_COMMUNITY): Payer: Medicaid Other

## 2020-05-02 DIAGNOSIS — I5031 Acute diastolic (congestive) heart failure: Secondary | ICD-10-CM

## 2020-05-02 DIAGNOSIS — R609 Edema, unspecified: Secondary | ICD-10-CM

## 2020-05-02 DIAGNOSIS — I313 Pericardial effusion (noninflammatory): Secondary | ICD-10-CM

## 2020-05-02 DIAGNOSIS — I5021 Acute systolic (congestive) heart failure: Secondary | ICD-10-CM

## 2020-05-02 DIAGNOSIS — D649 Anemia, unspecified: Secondary | ICD-10-CM

## 2020-05-02 LAB — BASIC METABOLIC PANEL
Anion gap: 11 (ref 5–15)
BUN: 15 mg/dL (ref 6–20)
CO2: 20 mmol/L — ABNORMAL LOW (ref 22–32)
Calcium: 8.6 mg/dL — ABNORMAL LOW (ref 8.9–10.3)
Chloride: 107 mmol/L (ref 98–111)
Creatinine, Ser: 0.91 mg/dL (ref 0.44–1.00)
GFR calc Af Amer: >60 mL/min (ref >60)
GFR calc non Af Amer: >60 mL/min (ref >60)
Glucose, Bld: 107 mg/dL — ABNORMAL HIGH (ref 70–99)
Potassium: 3.5 mmol/L (ref 3.5–5.1)
Sodium: 138 mmol/L (ref 135–145)

## 2020-05-02 LAB — ECHOCARDIOGRAM COMPLETE
Height: 66 in
Weight: 2400 oz

## 2020-05-02 LAB — HEMOGLOBIN AND HEMATOCRIT, BLOOD
HCT: 29.2 % — ABNORMAL LOW (ref 36.0–46.0)
Hemoglobin: 8.5 g/dL — ABNORMAL LOW (ref 12.0–15.0)

## 2020-05-02 LAB — MAGNESIUM: Magnesium: 1.6 mg/dL — ABNORMAL LOW (ref 1.7–2.4)

## 2020-05-02 LAB — HIV ANTIBODY (ROUTINE TESTING W REFLEX): HIV Screen 4th Generation wRfx: NONREACTIVE

## 2020-05-02 LAB — TSH: TSH: 3.073 u[IU]/mL (ref 0.350–4.500)

## 2020-05-02 LAB — PREPARE RBC (CROSSMATCH)

## 2020-05-02 MED ORDER — FUROSEMIDE 10 MG/ML IJ SOLN
40.0000 mg | Freq: Two times a day (BID) | INTRAMUSCULAR | Status: DC
  Administered 2020-05-02 – 2020-05-03 (×2): 40 mg via INTRAVENOUS
  Filled 2020-05-02 (×3): qty 4

## 2020-05-02 MED ORDER — ENALAPRILAT 1.25 MG/ML IV SOLN
1.2500 mg | Freq: Four times a day (QID) | INTRAVENOUS | Status: DC | PRN
  Administered 2020-05-02: 1.25 mg via INTRAVENOUS
  Filled 2020-05-02 (×2): qty 1

## 2020-05-02 MED ORDER — POTASSIUM CHLORIDE CRYS ER 20 MEQ PO TBCR
40.0000 meq | EXTENDED_RELEASE_TABLET | Freq: Once | ORAL | Status: AC
  Administered 2020-05-02: 40 meq via ORAL
  Filled 2020-05-02: qty 2

## 2020-05-02 MED ORDER — PERFLUTREN LIPID MICROSPHERE
1.0000 mL | INTRAVENOUS | Status: AC | PRN
  Administered 2020-05-02: 2 mL via INTRAVENOUS
  Filled 2020-05-02: qty 10

## 2020-05-02 MED ORDER — SODIUM CHLORIDE 0.9% IV SOLUTION: Freq: Once | INTRAVENOUS | Status: AC

## 2020-05-02 MED ORDER — MAGNESIUM SULFATE 2 GM/50ML IV SOLN
2.0000 g | Freq: Once | INTRAVENOUS | Status: AC
  Administered 2020-05-02: 2 g via INTRAVENOUS
  Filled 2020-05-02: qty 50

## 2020-05-02 NOTE — TOC Progression Note (Signed)
Transition of Care [facility]) - Progression Note    Patient Details  Name: Holly Bradley MRN: [id number] Date of Birth: 04/12/1976  Transition of Care [facility]) CM/SW Contact  Zenon Mayo, RN Phone Number: 05/02/2020, 3:17 PM  Clinical Narrative:    NCM spoke with patient , she is from [place], she has Medicaid of [place], she states she plans to stay here in [place], NCM informed her that she will need to contact her Medicaid Case Worker to get her changed over to [facility] and also to help her find a PCP to put on her Medicaid Card.  Since she has insurance , can not help her with the Match Letter, so will give her a good rx discount card.          Expected Discharge Plan and Services                                                 Social Determinants of Health (SDOH) Interventions    Readmission Risk Interventions No flowsheet data found.

## 2020-05-02 NOTE — Progress Notes (Signed)
  Echocardiogram 2D Echocardiogram with definitiy has been performed.  Darlina Sicilian M 05/02/2020, 9:17 AM

## 2020-05-02 NOTE — Progress Notes (Signed)
Lower venous duplex       has been completed. Preliminary results can be found under CV proc through chart review. Temika Sutphin, BS, RDMS, RVT   

## 2020-05-02 NOTE — Progress Notes (Addendum)
Progress Note  [name] Date of Encounter: 05/02/2020  Primary Cardiologist: New - Dr. Ellyn Hack  Subjective   Denies any CP, still have some SOB at baseline after receiving diuretic - and blood transfusion. Just a little bit unsure why her hemoglobin only went up two points. I explained that this is normal.   Inpatient Medications    Scheduled Meds: . carvedilol  6.25 mg Oral BID WC  . Chlorhexidine Gluconate Cloth  6 each Topical Daily  . enoxaparin (LOVENOX) injection  40 mg Subcutaneous Q24H  . furosemide  40 mg Intravenous Daily  . lisinopril  20 mg Oral Daily  . sodium chloride flush  10-40 mL Intracatheter Q12H  . sodium chloride flush  3 mL Intravenous Q12H   Continuous Infusions: . sodium chloride     PRN Meds: sodium chloride, acetaminophen, enalaprilat, metoprolol tartrate, ondansetron (ZOFRAN) IV, perflutren lipid microspheres (DEFINITY) IV suspension, sodium chloride flush, sodium chloride flush   Vital Signs    Vitals:   05/02/20 0615 05/02/20 0735 05/02/20 0736 05/02/20 0748  BP: (!) 173/98 (!) 182/98 (!) 182/98   Pulse: 86 84 86 82  Resp: 16 18    Temp:      TempSrc:      SpO2: 100% 100%  100%  Weight:      Height:        Intake/Output Summary (Last 24 hours) at 05/02/2020 0909 Last data filed at 05/01/2020 1855 Gross per 24 hour  Intake 315 ml  Output 700 ml  Net -385 ml   Last 3 Weights 05/01/2020  Weight (lbs) 150 lb  Weight (kg) 68.04 kg      Telemetry    Single episode of SVT after arrival yesterday, no SVT since - Personally Reviewed  ECG    Atrial tachycardia, no significant ST-T wave changes - Personally Reviewed  Physical Exam   GEN: No acute distress. Looks and feels a lot better than she did yesterday. Neck: No JVD or brewery Cardiac:  relatively distant S1 and S2, normal. RRR, no murmurs, rubs, or gallops.  Respiratory: Clear to auscultation bilaterally. Nonlabor, good air movement GI: Soft,  nontender, non-distended  MS: 1-2 + edema; No deformity. Neuro:  Nonfocal  Psych: Normal affect   Labs    High Sensitivity Troponin:   Recent Labs  Lab 05/01/20 1143 05/01/20 1430  TROPONINIHS 82* 71*      Chemistry Recent Labs  Lab 05/01/20 1143 05/02/20 0422  NA 139 138  K 3.5 3.5  CL 107 107  CO2 19* 20*  GLUCOSE 99 107*  BUN 13 15  CREATININE 0.86 0.91  CALCIUM 9.4 8.6*  GFRNONAA >60 >60  GFRAA >60 >60  ANIONGAP 13 11     Hematology Recent Labs  Lab 05/01/20 1143 05/01/20 1251 05/02/20 0422  WBC 4.9  --  5.2  RBC 3.12* 2.93* 3.36*  HGB 4.0*  --  6.2*  HCT 17.4*  --  21.5*  MCV 55.8*  --  64.0*  MCH 12.8*  --  18.5*  MCHC 23.0*  --  28.8*  RDW 22.7*  --  31.8*  PLT 332  --  263    BNP Recent Labs  Lab 05/01/20 1143  BNP 2,782.5*     DDimer No results for input(s): DDIMER in the last 168 hours.   Radiology    DG Chest Portable 1 View  Result Date: 05/01/2020 CLINICAL DATA:  Dyspnea, leg swelling EXAM: PORTABLE CHEST 1 VIEW COMPARISON:  None. FINDINGS: No consolidation or edema within limitation of overlying defibrillator pads. Cardiomegaly. No significant pleural effusion. No pneumothorax. IMPRESSION: Clear lungs.  Cardiomegaly. Electronically Signed   By: Macy Mis M.D.   On: 05/01/2020 14:45    Cardiac Studies    511 2021-2D Echo: EF 40-45%. Mildly decreased function with global hypochondriasis. No RWM A. GR to DD. Elevated LAP. Mild LA/RA dilation. Moderate pericardial effusion without evidence of tapenade. Mild-moderate TR. Dilated IVC suggestive of elevated RAP.Marland Kitchen  Patient Profile     44 y.o. female with presented with SVT in the setting of symptomatic anemia secondary to bleeding uterine fibroids.  Assessment & Plan    1. SVT -- currently in sinus rhythm. Beta-blocker started yesterday.  - she was previously diagnosed with SVT at [place] a year ago  echocardiogram reviewed:  - continue coreg. Consider EP study with SVT ablation as  outpatient.   I discussed with electrophysiology. They would prefer to see her in the outpatient setting once her acute illnesses stabilize.  2. Severe anemia 2/2 bleeding uterine fibroids  - hemoglobin was 4.0 on arrival. Now 6.2 after blood transfusion. Managed by hospitalist, likely need close outpatient followup with GYN  3. Leg edema: lung is clear on exam, likely acute diastolic heart failure and high output heart failure in the setting of severe anemia. Continue IV lasix for ~24hours.   4. HTN: BP stable -> continue carvedilol. I agree with lisinopril and HCTZ, but would hold off on the HCTZ until we are able to give at least one more day of IV Lasix.  5. Pericardial effusion -moderate fusion noted on echo, no signs of tapenade. Would probably follow-up limited echo in about a week or two to reassess. This will also allow [place] to reassess EF once her acute illness is resolved.  6. Likely high-output heart failure with reduced EF on echocardiogram. Probably related combination of prolonged SVT anemia. No regional wall motion reality to exclude ischemic event. > Would hope that this will return to normal or close the baseline as time goes by. Plan is to recheck and echo in a week or two.      For questions or updates, please contact [place] Please consult [email or web address] for contact info under        Signed, Almyra Deforest, [place]  05/02/2020, 9:09 AM     ATTENDING ATTESTATION  I have seen, examined and evaluated the patient this AM along with Almyra Deforest, PA.  After reviewing all the available data and chart, we discussed the patients laboratory, study & physical findings as well as symptoms in detail. I agree with his findings, examination as well as impression recommendations as per our discussion.    Attending adjustments noted in italics.   Overall she feels a lot better. She feels better with diuresis but also with the red blood cells. Still getting more blood. Will continue IV Lasix  until blood changes are complete. Probably continue IV Lasix IV through today, and then consider switching the lisinopril-HCTZ with as needed basis. Continue ARB.  With pericardial fusion monitors VF, this could simply just be related to high output failure from SVT and anemia. Need to reassess in a week or two determine if the size increases or decreases and to reassess if once she has been stabilized from her SVT anemia.  I have discussed with electrophysiology. They will arrange for outpatient follow-up to discuss SPT ablation.   Glenetta Hew, M.D., M.S. Interventional Cardiologist   Pager # [phone]  Phone # [phone] [address] [place], [place] [address]

## 2020-05-02 NOTE — Progress Notes (Signed)
PROGRESS NOTE    Holly Bradley  [id number] DOB: 05/29/1976 DOA: 05/01/2020 PCP: Patient, No Pcp Per   Brief Narrative:  Holly Bradley is a 44 y.o. female with medical history significant for paroxysmal SVT, HTN,and uterine fibroids with chronic anemia presented with increasing short of breath and palpitations.  Patient was diagnosed with SVT about 2 years ago in [place], she is been following her cardiologist there since, has been on nifedipine therapy with poor control.  She recently traveled to [place], she reported 2-3 times a week flareup of SVT when she will feel palpitations and short of breath, usually lasts up for 1 to 2 hours subsided with bedrest.  She also complains about increasing leg swelling, not short of breath associated with exertion.  Denies any cough no chest pain no fever chills.  Denies any urine changes no diarrhea.  States she has another flareup with feeling of extreme palpitations and short of breath.  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  Her OB/GYN in [place] proposed hysterectomy recently however patient yet to make decision. In ED: Telemetry monitor shows SVT, 1 dose of adenosine given, heart rate became more controlled.  However blood pressure still significantly elevated.  Hemoglobin 4.0    Assessment & Plan:   Active Problems:   SVT (supraventricular tachycardia) (HCC)   CHF (congestive heart failure) (HCC)   Acute symptomatic severe iron deficiency anemia with concurrent menorrhagia -2 unit PRBC, 1 dose IV Lasix in between each unit -IV iron --> p.o. iron on discharge -1 u given this am due to persistent anemia and mild, but ongoing, symptoms - Follow am labs  Acute symptomatic paroxysmal SVT -Continue Coreg to address both heart rate and blood pressure issue, will add as needed metoprolol  for heart rate breakthrough -TSH WNL -She never had EP study which to be considered outpatient per cardiology  Uncontrolled hypertension/hypertensive emergency -Moderately well controlled -Discontinue nifedipine -Start p.o. HCTZ/lisinopril, 1 dose of IV Lasix given in the ED  Questionable CHF exacerbation -1 dose of Lasix -Control blood pressure -Echo pending  DVT prophylaxis: Lovenox  code Status: Full code Family Communication: None at bedside Status is: Inpatient  Dispo: The patient is from: Home              Anticipated d/c is to: Home              Anticipated d/c date is: 24 to 48 hours              Patient currently not medically stable for discharge given ongoing need for transfusion, symptomatic anemia, ongoing diuresis per cardiology as well as monitoring of severe anemia.  Consultants:   Cardiology  Procedures:   None  Antimicrobials:  None  Subjective: No acute issues or events overnight, patient's fatigue weakness and palpitations have improved drastically with blood transfusion and heart rate control medication.  She somewhat fatigued and weak but markedly improved from baseline.  Denies headache, fever, chills, nausea, vomiting, diarrhea, constipation, chest pain, shortness of breath.  Objective: Vitals:   05/02/20 0601 05/02/20 0615 05/02/20 0735 05/02/20 0736  BP: (!) 180/108 (!) 173/98 (!) 182/98 (!) 182/98  Pulse: 85 86 84 86  Resp: 17 16 18    Temp:      TempSrc:      SpO2: 100% 100% 100%   Weight:  Height:        Intake/Output Summary (Last 24 hours) at 05/02/2020 0744 Last data filed at 05/01/2020 1855 Gross per 24 hour  Intake 315 ml  Output 700 ml  Net -385 ml   Filed Weights   05/01/20 1233  Weight: 68 kg    Examination:  General exam: Appears calm and comfortable  Respiratory system: Clear to auscultation. Respiratory effort normal. Cardiovascular system: S1 & S2 heard, RRR. No JVD, murmurs, rubs, gallops or clicks. No  pedal edema. Gastrointestinal system: Abdomen is nondistended, soft and nontender. No organomegaly or masses felt. Normal bowel sounds heard. Central nervous system: Alert and oriented. No focal neurological deficits. Extremities: Symmetric 5 x 5 power. Skin: No rashes, lesions or ulcers Psychiatry: Judgement and insight appear normal. Mood & affect appropriate.     Data Reviewed: I have personally reviewed following labs and imaging studies  CBC: Recent Labs  Lab 05/01/20 1143 05/02/20 0422  WBC 4.9 5.2  NEUTROABS  --  3.3  HGB 4.0* 6.2*  HCT 17.4* 21.5*  MCV 55.8* 64.0*  PLT 332 [id number]   Basic Metabolic Panel: Recent Labs  Lab 05/01/20 1143 05/02/20 0422  NA 139 138  K 3.5 3.5  CL 107 107  CO2 19* 20*  GLUCOSE 99 107*  BUN 13 15  CREATININE 0.86 0.91  CALCIUM 9.4 8.6*  MG  --  1.6*   GFR: Estimated Creatinine Clearance: 72.3 mL/min (by C-G formula based on SCr of 0.91 mg/dL). Liver Function Tests: No results for input(s): AST, ALT, ALKPHOS, BILITOT, PROT, ALBUMIN in the last 168 hours. No results for input(s): LIPASE, AMYLASE in the last 168 hours. No results for input(s): AMMONIA in the last 168 hours. Coagulation Profile: No results for input(s): INR, PROTIME in the last 168 hours. Cardiac Enzymes: No results for input(s): CKTOTAL, CKMB, CKMBINDEX, TROPONINI in the last 168 hours. BNP (last 3 results) No results for input(s): PROBNP in the last 8760 hours. HbA1C: No results for input(s): HGBA1C in the last 72 hours. CBG: No results for input(s): GLUCAP in the last 168 hours. Lipid Profile: No results for input(s): CHOL, HDL, LDLCALC, TRIG, CHOLHDL, LDLDIRECT in the last 72 hours. Thyroid Function Tests: Recent Labs    05/02/20 0422  TSH 3.073   Anemia Panel: Recent Labs    05/01/20 1251  VITAMINB12 608  FOLATE 9.0  FERRITIN 2*  TIBC 423  IRON 13*  RETICCTPCT 1.8   Sepsis Labs: No results for input(s): PROCALCITON, LATICACIDVEN in the last 168  hours.  Recent Results (from the past 240 hour(s))  SARS Coronavirus 2 by RT PCR (hospital order, performed in [facility] hospital lab) Nasopharyngeal Nasopharyngeal Swab     Status: None   Collection Time: 05/01/20 12:59 PM   Specimen: Nasopharyngeal Swab  Result Value Ref Range Status   SARS Coronavirus 2 NEGATIVE NEGATIVE Final    Comment: (NOTE) SARS-CoV-2 target nucleic acids are NOT DETECTED. The SARS-CoV-2 RNA is generally detectable in upper and lower respiratory specimens during the acute phase of infection. The lowest concentration of SARS-CoV-2 viral copies this assay can detect is 250 copies / mL. A negative result does not preclude SARS-CoV-2 infection and should not be used as the sole basis for treatment or other patient management decisions.  A negative result may occur with improper specimen collection / handling, submission of specimen other than nasopharyngeal swab, presence of viral mutation(s) within the areas targeted by this assay, and inadequate number of viral copies (<250 copies /  mL). A negative result must be combined with clinical observations, patient history, and epidemiological information. Fact Sheet for Patients:   StrictlyIdeas.no Fact Sheet for Healthcare Providers: BankingDealers.co.za This test is not yet approved or cleared  by the [place] FDA and has been authorized for detection and/or diagnosis of SARS-CoV-2 by FDA under an Emergency Use Authorization (EUA).  This EUA will remain in effect (meaning this test can be used) for the duration of the COVID-19 declaration under Section 564(b)(1) of the Act, 21 U.S.C. section 360bbb-3(b)(1), unless the authorization is terminated or revoked sooner. Performed at [facility], [place] [address], [place], [place] [address]      Radiology Studies: DG Chest Portable 1 View  Result Date: 05/01/2020 CLINICAL DATA:  Dyspnea, leg swelling EXAM:  PORTABLE CHEST 1 VIEW COMPARISON:  None. FINDINGS: No consolidation or edema within limitation of overlying defibrillator pads. Cardiomegaly. No significant pleural effusion. No pneumothorax. IMPRESSION: Clear lungs.  Cardiomegaly. Electronically Signed   By: Macy Mis M.D.   On: 05/01/2020 14:45    Scheduled Meds: . carvedilol  6.25 mg Oral BID WC  . Chlorhexidine Gluconate Cloth  6 each Topical Daily  . enoxaparin (LOVENOX) injection  40 mg Subcutaneous Q24H  . furosemide  40 mg Intravenous Daily  . lisinopril  20 mg Oral Daily  . sodium chloride flush  10-40 mL Intracatheter Q12H  . sodium chloride flush  3 mL Intravenous Q12H   Continuous Infusions: . sodium chloride    . ferric gluconate (FERRLECIT/NULECIT) Test Dose 25 mg (05/02/20 0738)     LOS: 1 day   Time spent: 46min  Bayley [place] C Tifany Hirsch, DO [facility]  If 7PM-7AM, please contact night-coverage [email or web address]  05/02/2020, 7:44 AM

## 2020-05-03 DIAGNOSIS — I5041 Acute combined systolic (congestive) and diastolic (congestive) heart failure: Secondary | ICD-10-CM

## 2020-05-03 DIAGNOSIS — I10 Essential (primary) hypertension: Secondary | ICD-10-CM

## 2020-05-03 LAB — CBC WITH DIFFERENTIAL/PLATELET
Abs Immature Granulocytes: 0.01 10*3/uL (ref 0.00–0.07)
Basophils Absolute: 0 10*3/uL (ref 0.0–0.1)
Basophils Relative: 1 %
Eosinophils Absolute: 0 10*3/uL (ref 0.0–0.5)
Eosinophils Relative: 1 %
HCT: 21.5 % — ABNORMAL LOW (ref 36.0–46.0)
Hemoglobin: 6.2 g/dL — CL (ref 12.0–15.0)
Immature Granulocytes: 0 %
Lymphocytes Relative: 23 %
Lymphs Abs: 1.2 10*3/uL (ref 0.7–4.0)
MCH: 18.5 pg — ABNORMAL LOW (ref 26.0–34.0)
MCHC: 28.8 g/dL — ABNORMAL LOW (ref 30.0–36.0)
MCV: 64 fL — ABNORMAL LOW (ref 80.0–100.0)
Monocytes Absolute: 0.6 10*3/uL (ref 0.1–1.0)
Monocytes Relative: 12 %
Neutro Abs: 3.3 10*3/uL (ref 1.7–7.7)
Neutrophils Relative %: 63 %
Platelets: 263 10*3/uL (ref 150–400)
RBC: 3.36 MIL/uL — ABNORMAL LOW (ref 3.87–5.11)
RDW: 31.8 % — ABNORMAL HIGH (ref 11.5–15.5)
WBC: 5.2 10*3/uL (ref 4.0–10.5)
nRBC: 2.9 % — ABNORMAL HIGH (ref 0.0–0.2)

## 2020-05-03 LAB — CBC
HCT: 26.2 % — ABNORMAL LOW (ref 36.0–46.0)
Hemoglobin: 7.6 g/dL — ABNORMAL LOW (ref 12.0–15.0)
MCH: 19.5 pg — ABNORMAL LOW (ref 26.0–34.0)
MCHC: 29 g/dL — ABNORMAL LOW (ref 30.0–36.0)
MCV: 67.4 fL — ABNORMAL LOW (ref 80.0–100.0)
Platelets: 252 10*3/uL (ref 150–400)
RBC: 3.89 MIL/uL (ref 3.87–5.11)
WBC: 6.9 10*3/uL (ref 4.0–10.5)
nRBC: 1.6 % — ABNORMAL HIGH (ref 0.0–0.2)

## 2020-05-03 LAB — BASIC METABOLIC PANEL
Anion gap: 8 (ref 5–15)
BUN: 12 mg/dL (ref 6–20)
CO2: 21 mmol/L — ABNORMAL LOW (ref 22–32)
Calcium: 8.4 mg/dL — ABNORMAL LOW (ref 8.9–10.3)
Chloride: 111 mmol/L (ref 98–111)
Creatinine, Ser: 0.9 mg/dL (ref 0.44–1.00)
GFR calc Af Amer: >60 mL/min (ref >60)
GFR calc non Af Amer: >60 mL/min (ref >60)
Glucose, Bld: 101 mg/dL — ABNORMAL HIGH (ref 70–99)
Potassium: 3.9 mmol/L (ref 3.5–5.1)
Sodium: 140 mmol/L (ref 135–145)

## 2020-05-03 LAB — BPAM RBC
Blood Product Expiration Date: 202105182359
Blood Product Expiration Date: 202105252359
Blood Product Expiration Date: 202105262359
Blood Product Expiration Date: 202106102359
Blood Product Expiration Date: 202106102359
ISSUE DATE / TIME: 202105101617
ISSUE DATE / TIME: 202105101707
ISSUE DATE / TIME: 202105101743
ISSUE DATE / TIME: 202105101948
ISSUE DATE / TIME: 202105111225
Unit Type and Rh: 5100
Unit Type and Rh: 5100
Unit Type and Rh: 5100
Unit Type and Rh: 9500
Unit Type and Rh: 9500

## 2020-05-03 LAB — TYPE AND SCREEN
ABO/RH(D): O POS
Antibody Screen: NEGATIVE
Donor AG Type: NEGATIVE
Donor AG Type: NEGATIVE
Donor AG Type: NEGATIVE
PT AG Type: NEGATIVE
Unit division: 0
Unit division: 0
Unit division: 0
Unit division: 0
Unit division: 0

## 2020-05-03 MED ORDER — LISINOPRIL 20 MG PO TABS: 40.0000 mg | ORAL_TABLET | Freq: Every day | ORAL | 1 refills | Status: DC

## 2020-05-03 MED ORDER — FUROSEMIDE 40 MG PO TABS
40.0000 mg | ORAL_TABLET | Freq: Every day | ORAL | Status: DC
Start: 2020-05-03 — End: 2021-01-27

## 2020-05-03 MED ORDER — LISINOPRIL 40 MG PO TABS
40.0000 mg | ORAL_TABLET | Freq: Every day | ORAL | Status: DC
  Administered 2020-05-03: 40 mg via ORAL
  Filled 2020-05-03: qty 1

## 2020-05-03 MED ORDER — CARVEDILOL 6.25 MG PO TABS: 12.5000 mg | ORAL_TABLET | Freq: Two times a day (BID) | ORAL | 1 refills | Status: DC

## 2020-05-03 MED ORDER — CARVEDILOL 6.25 MG PO TABS: 6.2500 mg | ORAL_TABLET | Freq: Two times a day (BID) | ORAL | 1 refills | Status: DC

## 2020-05-03 MED ORDER — FUROSEMIDE 40 MG PO TABS: 40.0000 mg | ORAL_TABLET | Freq: Every day | ORAL | Status: DC

## 2020-05-03 MED ORDER — LISINOPRIL 20 MG PO TABS: 20.0000 mg | ORAL_TABLET | Freq: Every day | ORAL | 1 refills | Status: DC

## 2020-05-03 MED ORDER — CARVEDILOL 12.5 MG PO TABS
12.5000 mg | ORAL_TABLET | Freq: Two times a day (BID) | ORAL | Status: DC
  Administered 2020-05-03: 12.5 mg via ORAL
  Filled 2020-05-03: qty 1

## 2020-05-03 NOTE — Progress Notes (Addendum)
Progress Note  [name] Date of Encounter: 05/03/2020  Primary Cardiologist: Glenetta Hew, MD   Subjective   Dyspnea improved. Denies any CP.  Looks and feels good today.  Inpatient Medications    Scheduled Meds:  carvedilol  12.5 mg Oral BID WC   Chlorhexidine Gluconate Cloth  6 each Topical Daily   enoxaparin (LOVENOX) injection  40 mg Subcutaneous Q24H   furosemide  40 mg Intravenous BID   lisinopril  40 mg Oral Daily   sodium chloride flush  10-40 mL Intracatheter Q12H   sodium chloride flush  3 mL Intravenous Q12H   Continuous Infusions:  sodium chloride     PRN Meds: sodium chloride, acetaminophen, enalaprilat, metoprolol tartrate, ondansetron (ZOFRAN) IV, sodium chloride flush, sodium chloride flush   Vital Signs    Vitals:   05/02/20 2357 05/03/20 0630 05/03/20 0640 05/03/20 0829  BP: (!) 153/86  (!) 171/102 (!) 162/85  Pulse: 90  88 85  Resp: 18  18 18   Temp: 98.9 F (37.2 C)  99.3 F (37.4 C)   TempSrc: Oral  Oral   SpO2: 100%  100% 100%  Weight:  72.8 kg    Height:        Intake/Output Summary (Last 24 hours) at 05/03/2020 1122 Last data filed at 05/02/2020 2225 Gross per 24 hour  Intake 1052.08 ml  Output 1500 ml  Net -447.92 ml   Last 3 Weights 05/03/2020 05/02/2020 05/01/2020  Weight (lbs) 160 lb 8 oz 161 lb 150 lb  Weight (kg) 72.802 kg 73.029 kg 68.04 kg      Telemetry    NSR with single 5 beats run of NSVT - Personally Reviewed  ECG    NSR with poor R wave progression in the anterior leads - Personally Reviewed  Physical Exam   GEN: No acute distress.   Neck: No JVD Cardiac:  Distant S1 and S2.  RRR, no murmurs, rubs, or gallops.  Respiratory: Clear to auscultation bilaterally.  Nonlabored, good air movement GI: Soft, nontender, non-distended  MS: 1+ edema; No deformity. Neuro:  Nonfocal  Psych: Normal affect   Labs    High Sensitivity Troponin:   Recent Labs  Lab 05/01/20 1143 05/01/20 1430   TROPONINIHS 82* 71*      Chemistry Recent Labs  Lab 05/01/20 1143 05/02/20 0422 05/03/20 0350  NA 139 138 140  K 3.5 3.5 3.9  CL 107 107 111  CO2 19* 20* 21*  GLUCOSE 99 107* 101*  BUN 13 15 12   CREATININE 0.86 0.91 0.90  CALCIUM 9.4 8.6* 8.4*  GFRNONAA >60 >60 >60  GFRAA >60 >60 >60  ANIONGAP 13 11 8      Hematology Recent Labs  Lab 05/01/20 1143 05/01/20 1143 05/01/20 1251 05/02/20 0422 05/02/20 1948 05/03/20 0350  WBC 4.9  --   --  5.2  --  6.9  RBC 3.12*  --  2.93* 3.36*  --  3.89  HGB 4.0*   < >  --  6.2* 8.5* 7.6*  HCT 17.4*   < >  --  21.5* 29.2* 26.2*  MCV 55.8*  --   --  64.0*  --  67.4*  MCH 12.8*  --   --  18.5*  --  19.5*  MCHC 23.0*  --   --  28.8*  --  29.0*  RDW 22.7*  --   --  31.8*  --  Not Measured  PLT 332  --   --  263  --  252   < > = values in this interval not displayed.    BNP Recent Labs  Lab 05/01/20 1143  BNP 2,782.5*     DDimer No results for input(s): DDIMER in the last 168 hours.   Radiology    DG Chest Portable 1 View  Result Date: 05/01/2020 CLINICAL DATA:  Dyspnea, leg swelling EXAM: PORTABLE CHEST 1 VIEW COMPARISON:  None. FINDINGS: No consolidation or edema within limitation of overlying defibrillator pads. Cardiomegaly. No significant pleural effusion. No pneumothorax. IMPRESSION: Clear lungs.  Cardiomegaly. Electronically Signed   By: Macy Mis M.D.   On: 05/01/2020 14:45   ECHOCARDIOGRAM COMPLETE  Result Date: 05/02/2020    ECHOCARDIOGRAM REPORT   [name] Date of Exam: 05/02/2020 Medical Rec #:  [id number]        Height:       66.0 in Accession #:    [id number]       Weight:       150.0 lb Date of Birth:  Nov 20, 1976        BSA:          1.770 m Patient Age:    44 years         BP:           182/98 mmHg Patient Gender: F                HR:           82 bpm. Exam Location:  Inpatient Procedure: 2D Echo and Intracardiac Opacification Agent Indications:    CHF-Acute Diastolic [id number] / [id number]   History:        Patient has no prior history of Echocardiogram examinations.                 Signs/Symptoms:Shortness of Breath; Risk Factors:Hypertension.                 Paroxysmal SVT, chronic anemia.  Sonographer:    Darlina Sicilian RDCS Referring Phys: [id number] [place]  1. Left ventricular ejection fraction, by estimation, is 40 to 45%. The left ventricle has mildly decreased function. The left ventricle demonstrates global hypokinesis. There is mild concentric left ventricular hypertrophy. Left ventricular diastolic parameters are consistent with Grade II diastolic dysfunction (pseudonormalization). Elevated left atrial pressure.  2. Right ventricular systolic function is low normal. The right ventricular size is normal. There is moderately elevated pulmonary artery systolic pressure.  3. Left atrial size was mildly dilated.  4. Right atrial size was mildly dilated.  5. Moderate pericardial effusion. The pericardial effusion is circumferential. There is no evidence of cardiac tamponade.  6. The mitral valve is normal in structure. No evidence of mitral valve regurgitation.  7. Tricuspid valve regurgitation is mild to moderate.  8. The aortic valve is normal in structure. Aortic valve regurgitation is not visualized.  9. The inferior vena cava is dilated in size with <50% respiratory variability, suggesting right atrial pressure of 15 mmHg. Comparison(s): No prior Echocardiogram. FINDINGS  Left Ventricle: Left ventricular ejection fraction, by estimation, is 40 to 45%. The left ventricle has mildly decreased function. The left ventricle demonstrates global hypokinesis. Definity contrast agent was given IV to delineate the left ventricular  endocardial borders. The left ventricular internal cavity size was normal in size. There is mild concentric left ventricular hypertrophy. Left ventricular diastolic parameters are consistent with Grade II diastolic dysfunction (pseudonormalization). Elevated  left atrial pressure. Right Ventricle: The right ventricular size is  normal. No increase in right ventricular wall thickness. Right ventricular systolic function is low normal. There is moderately elevated pulmonary artery systolic pressure. The tricuspid regurgitant velocity  is 2.97 m/s, and with an assumed right atrial pressure of 15 mmHg, the estimated right ventricular systolic pressure is 0000000 mmHg. Left Atrium: Left atrial size was mildly dilated. Right Atrium: Right atrial size was mildly dilated. Pericardium: A moderately sized pericardial effusion is present. The pericardial effusion is circumferential. There is no evidence of cardiac tamponade. Mitral Valve: The mitral valve is normal in structure. No evidence of mitral valve regurgitation. Tricuspid Valve: The tricuspid valve is normal in structure. Tricuspid valve regurgitation is mild to moderate. Aortic Valve: The aortic valve is normal in structure. Aortic valve regurgitation is not visualized. Pulmonic Valve: The pulmonic valve was normal in structure. Pulmonic valve regurgitation is trivial. Aorta: The aortic root and ascending aorta are structurally normal, with no evidence of dilitation. Venous: The inferior vena cava is dilated in size with less than 50% respiratory variability, suggesting right atrial pressure of 15 mmHg. IAS/Shunts: No atrial level shunt detected by color flow Doppler.  LEFT VENTRICLE PLAX 2D LVIDd:         5.00 cm      Diastology LVIDs:         4.00 cm      LV e' lateral:   5.51 cm/s LV PW:         1.30 cm      LV E/e' lateral: 20.3 LV IVS:        1.20 cm      LV e' medial:    4.30 cm/s LVOT diam:     2.00 cm      LV E/e' medial:  26.0 LV SV:         58 LV SV Index:   33 LVOT Area:     3.14 cm  LV Volumes (MOD) LV vol d, MOD A2C: 191.0 ml LV vol d, MOD A4C: 196.0 ml LV vol s, MOD A2C: 107.0 ml LV vol s, MOD A4C: 131.0 ml LV SV MOD A2C:     84.0 ml LV SV MOD A4C:     196.0 ml LV SV MOD BP:      72.8 ml RIGHT VENTRICLE              IVC RV S prime:     11.40 cm/s  IVC diam: 2.60 cm TAPSE (M-mode): 1.7 cm LEFT ATRIUM             Index       RIGHT ATRIUM           Index LA diam:        3.60 cm 2.03 cm/m  RA Area:     24.20 cm LA Vol (A2C):   63.5 ml 35.88 ml/m RA Volume:   80.70 ml  45.60 ml/m LA Vol (A4C):   74.6 ml 42.15 ml/m LA Biplane Vol: 68.8 ml 38.88 ml/m  AORTIC VALVE LVOT Vmax:   126.00 cm/s LVOT Vmean:  80.700 cm/s LVOT VTI:    0.185 m  AORTA Ao Root diam: 2.50 cm Ao Asc diam:  3.00 cm MITRAL VALVE                TRICUSPID VALVE MV Area (PHT): 5.54 cm     TR Peak grad:   35.3 mmHg MV Decel Time: 137 msec     TR Vmax:  297.00 cm/s MV E velocity: 112.00 cm/s MV A velocity: 60.20 cm/s   SHUNTS MV E/A ratio:  1.86         Systemic VTI:  0.18 m                             Systemic Diam: 2.00 cm Dani Gobble Croitoru MD Electronically signed by Sanda Klein MD Signature Date/Time: 05/02/2020/9:56:04 AM    Final    VAS [place] LOWER EXTREMITY VENOUS (DVT)  Result Date: 05/02/2020  Lower Venous DVTStudy Indications: Edema.  Comparison Study: none Performing Technologist: June Leap RDMS, RVT  Examination Guidelines: A complete evaluation includes B-mode imaging, spectral Doppler, color Doppler, and power Doppler as needed of all accessible portions of each vessel. Bilateral testing is considered an integral part of a complete examination. Limited examinations for reoccurring indications may be performed as noted. The reflux portion of the exam is performed with the patient in reverse Trendelenburg.  +---------+---------------+---------+-----------+----------+--------------+  RIGHT     Compressibility Phasicity Spontaneity Properties Thrombus Aging  +---------+---------------+---------+-----------+----------+--------------+  CFV       Full            Yes       Yes                                    +---------+---------------+---------+-----------+----------+--------------+  SFJ       Full                                                              +---------+---------------+---------+-----------+----------+--------------+  FV Prox   Full                                                             +---------+---------------+---------+-----------+----------+--------------+  FV Mid    Full                                                             +---------+---------------+---------+-----------+----------+--------------+  FV Distal Full                                                             +---------+---------------+---------+-----------+----------+--------------+  PFV       Full                                                             +---------+---------------+---------+-----------+----------+--------------+  POP  Full            Yes       Yes                                    +---------+---------------+---------+-----------+----------+--------------+  PTV       Full                                                             +---------+---------------+---------+-----------+----------+--------------+  PERO      Full                                                             +---------+---------------+---------+-----------+----------+--------------+   +---------+---------------+---------+-----------+----------+--------------+  LEFT      Compressibility Phasicity Spontaneity Properties Thrombus Aging  +---------+---------------+---------+-----------+----------+--------------+  CFV       Full            Yes       Yes                                    +---------+---------------+---------+-----------+----------+--------------+  SFJ       Full                                                             +---------+---------------+---------+-----------+----------+--------------+  FV Prox   Full                                                             +---------+---------------+---------+-----------+----------+--------------+  FV Mid    Full                                                              +---------+---------------+---------+-----------+----------+--------------+  FV Distal Full                                                             +---------+---------------+---------+-----------+----------+--------------+  PFV       Full                                                             +---------+---------------+---------+-----------+----------+--------------+  POP       Full            Yes       Yes                                    +---------+---------------+---------+-----------+----------+--------------+  PTV       Full                                                             +---------+---------------+---------+-----------+----------+--------------+  PERO      Full                                                             +---------+---------------+---------+-----------+----------+--------------+     Summary: RIGHT: - There is no evidence of deep vein thrombosis in the lower extremity.  - No cystic structure found in the popliteal fossa.  LEFT: - There is no evidence of deep vein thrombosis in the lower extremity.  - No cystic structure found in the popliteal fossa.  *See table(s) above for measurements and observations. Electronically signed by Harold Barban MD on 05/02/2020 at 9:48:05 PM.    Final     Cardiac Studies   Echo 05/02/2020 1. Left ventricular ejection fraction, by estimation, is 40 to 45%. The  left ventricle has mildly decreased function. The left ventricle  demonstrates global hypokinesis. There is mild concentric left ventricular  hypertrophy. Left ventricular diastolic  parameters are consistent with Grade II diastolic dysfunction  (pseudonormalization). Elevated left atrial pressure.  2. Right ventricular systolic function is low normal. The right  ventricular size is normal. There is moderately elevated pulmonary artery  systolic pressure.  3. Left atrial size was mildly dilated.  4. Right atrial size was mildly dilated.  5. Moderate pericardial  effusion. The pericardial effusion is  circumferential. There is no evidence of cardiac tamponade.  6. The mitral valve is normal in structure. No evidence of mitral valve  regurgitation.  7. Tricuspid valve regurgitation is mild to moderate.  8. The aortic valve is normal in structure. Aortic valve regurgitation is  not visualized.  9. The inferior vena cava is dilated in size with <50% respiratory  variability, suggesting right atrial pressure of 15 mmHg.   Patient Profile     44 y.o. female presented with SVT in the setting of symptomatic anemia secondary to bleeding uterine fibroids  Assessment & Plan    1. SVT  - recurrent SVT as she had SVT in [place] a year ago  - maintaining sinus rhythm. Continue on coreg.   - overnight, had single run of 5 beats of NSVT.    Hesitant to increase coreg to maximum dose given moderate pericardial effusion. Will continue on the 12.5mg  BID for now.   - consider SVT ablation as outpatient -> plan is for outpatient EP evaluation.  2. Acute combined systolic and diastolic CHF  - likely with high output CHF in the setting of severe anemia & SVT.   - lung is cleared, still has 1+  pitting edema. Although she still appears to be mildly volume overloaded, the rest of the fluid can be diuresed as outpatient. Switch to PO lasix 40mg  daily. Hospitalist service has started discharge note. I think she is stable enough from cardiac perspective to be discharged as well as long as she is followed closely by GYN  I would recommend that she takes the Lasix daily for the first week or so and then can probably switch it to as needed.  3. Severe anemia 2/2 bleeding uterine fibroids: need close followup with GYN as outpatient.   4. Moderate pericardial effusion: likely will consider to improve with diuresis. Will need repeat limited echo in 1-2 weeks  5. HTN (had accelerated hypertension on admission): BP remain elevated on coreg 12.5mg  BID and lisinopril 40mg  daily.  Hesitant to increase coreg further given moderate pericardial effusion, may consider to uptitrate as outpatient once she is diuresed more.        For questions or updates, please contact [place] Please consult [email or web address] for contact info under       Signed, Almyra Deforest, [place]  05/03/2020, 11:22 AM     ATTENDING ATTESTATION  I have seen, examined and evaluated the patient this AM along with Almyra Deforest, PA.  After reviewing all the available data and chart, we discussed the patients laboratory, study & physical findings as well as symptoms in detail. I agree with his findings, examination as well as impression recommendations as per our discussion.    Attending adjustments noted in italics.   Feels a lot better.  Rate better controlled.  Blood pressure somewhat improved.  We have titrated up her carvedilol dose pretty quickly and I be in favor of allowing her time to equilibrate before titrating further.  This concern is especially due to the presence of a pericardial effusion.   As for diuresis, okay to switch to oral Lasix which she should take daily for about a week and then can use as needed.  I think that she would probably benefit from outpatient GYN evaluation and may even be good to see hematology to potentially arrange outpatient IV iron.  Okay for discharge from cardiac standpoint.   [facility] will sign off.   Medication Recommendations: Converted to 40 mg p.o. Lasix.  Continue current blood pressure medications. Other recommendations (labs, testing, etc): We will arrange outpatient echo in 2 to 3 weeks. Follow up as an outpatient: She will be set up to see electrophysiology in outpatient setting to discuss SVT ablation, will also arrange outpatient follow-up with either myself or Almyra Deforest, [place]    Glenetta Hew, M.D., M.S. Interventional Cardiologist   Pager # [phone] Phone # [phone] [address] [place], [place] [address]

## 2020-05-03 NOTE — Discharge Summary (Signed)
Physician Discharge Summary  Holly Bradley [id number] DOB: 1976/03/23 DOA: 05/01/2020  PCP: Patient, No Pcp Per  Admit date: 05/01/2020 Discharge date: 05/03/2020  Admitted From: Home Disposition: Home  Recommendations for Outpatient Follow-up:  1. Follow up with PCP in 1-2 weeks, cardiology EP specialist as indicated 2. Please obtain BMP/CBC in 1-2 weeks  Discharge Condition: Stable CODE STATUS: Full Diet recommendation: Cardiac prudent, low-salt low-fat diet  Brief/Interim Summary: [name] a 44 y.o.femalewith medical history significantforparoxysmal SVT,HTN,and uterine fibroids with chronic anemiapresented with increasing short of breath and palpitations. Patient was diagnosed with SVT about 2 years ago in [place], she is been following her cardiologist there since, has beenon nifedipine therapy with poor control. She recently traveled to [place], she reported 2-3 times a week flareup of SVT when she will feel palpitations and short of breath, usually lasts upfor 1 to 2 hours subsided with bedrest. She also complains about increasing leg swelling, not short of breath associated with exertion. Denies any cough no chest pain no fever chills. Denies any urine changes no diarrhea. States she has another flareup with feeling of extreme palpitations and short of breath. 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 Her OB/GYN in [place] proposed hysterectomy recently however patient yet to make decision. In ED: Telemetry monitor shows SVT, 1 dose of adenosine given, heart rate became more controlled. However blood pressure still significantly elevated. Hemoglobin 4.0 at admission, hospitalist called to admit.  Patient admitted as above with acute SVT in the setting of symptomatic anemia likely acute blood loss anemia  on chronic iron deficiency anemia.  Patient responded quite well to beta-blockade, transfuse 3 units of blood over the past 24 hours with further work-up showing reduced ejection fraction dysfunction at 40 to 45%.  Unclear if this EF is her baseline or provoked in the setting of above, as such cardiology will follow in the outpatient setting per their expertise and schedule.  Since anemia drastically improved with 3 units PRBC, we discussed counter iron supplementation with repeat iron level valuation with PCP in the next few months.  Given patient's somewhat longstanding SVT, previously diagnosed in [place], again likely provoked in the setting of symptomatic anemia we will hold off on further evaluation or intervention, continue beta-blockade per cardiology and follow closely with PCP, outpatient EP cardiology specialist evaluation is certainly reasonable if patient has further issues or symptoms with tachyarrhythmias but at this time appears rate controlled after resolution of anemia and on new medications as below.  Discharge Diagnoses:  Active Problems:   SVT (supraventricular tachycardia) (HCC)   CHF (congestive heart failure) [facility])    Discharge Instructions  Discharge Instructions    Call MD for:  difficulty breathing, headache or visual disturbances   Complete by: As directed    Call MD for:  extreme fatigue   Complete by: As directed    Call MD for:  hives   Complete by: As directed    Call MD for:  persistant dizziness or light-headedness   Complete by: As directed    Call MD for:  persistant nausea and vomiting   Complete by: As directed    Call MD for:  severe uncontrolled pain   Complete by: As directed    Call MD for:  temperature >100.4   Complete by: As directed    Diet - low sodium  heart healthy   Complete by: As directed    Increase activity slowly   Complete by: As directed      Allergies as of 05/03/2020   No Known Allergies     Medication List    STOP taking  these medications   NIFEdipine 90 MG 24 hr tablet Commonly known as: ADALAT CC     TAKE these medications   carvedilol 6.25 MG tablet Commonly known as: COREG Take 2 tablets (12.5 mg total) by mouth 2 (two) times daily with a meal.   lisinopril 20 MG tablet Commonly known as: ZESTRIL Take 2 tablets (40 mg total) by mouth daily.       No Known Allergies  Consultations: [facility] cardiology  Procedures/Studies: DG Chest Portable 1 View  Result Date: 05/01/2020 CLINICAL DATA:  Dyspnea, leg swelling EXAM: PORTABLE CHEST 1 VIEW COMPARISON:  None. FINDINGS: No consolidation or edema within limitation of overlying defibrillator pads. Cardiomegaly. No significant pleural effusion. No pneumothorax. IMPRESSION: Clear lungs.  Cardiomegaly. Electronically Signed   By: Macy Mis M.D.   On: 05/01/2020 14:45   ECHOCARDIOGRAM COMPLETE  Result Date: 05/02/2020    ECHOCARDIOGRAM REPORT   [name] Date of Exam: 05/02/2020 Medical Rec #:  [id number]        Height:       66.0 in Accession #:    [id number]       Weight:       150.0 lb Date of Birth:  1976-06-11        BSA:          1.770 m Patient Age:    44 years         BP:           182/98 mmHg Patient Gender: F                HR:           82 bpm. Exam Location:  Inpatient Procedure: 2D Echo and Intracardiac Opacification Agent Indications:    CHF-Acute Diastolic [id number] / [id number]  History:        Patient has no prior history of Echocardiogram examinations.                 Signs/Symptoms:Shortness of Breath; Risk Factors:Hypertension.                 Paroxysmal SVT, chronic anemia.  Sonographer:    Darlina Sicilian RDCS Referring Phys: [id number] [place]  1. Left ventricular ejection fraction, by estimation, is 40 to 45%. The left ventricle has mildly decreased function. The left ventricle demonstrates global hypokinesis. There is mild concentric left ventricular hypertrophy. Left ventricular diastolic parameters are  consistent with Grade II diastolic dysfunction (pseudonormalization). Elevated left atrial pressure.  2. Right ventricular systolic function is low normal. The right ventricular size is normal. There is moderately elevated pulmonary artery systolic pressure.  3. Left atrial size was mildly dilated.  4. Right atrial size was mildly dilated.  5. Moderate pericardial effusion. The pericardial effusion is circumferential. There is no evidence of cardiac tamponade.  6. The mitral valve is normal in structure. No evidence of mitral valve regurgitation.  7. Tricuspid valve regurgitation is mild to moderate.  8. The aortic valve is normal in structure. Aortic valve regurgitation is not visualized.  9. The inferior vena cava is dilated in size with <50% respiratory variability, suggesting right atrial pressure of 15 mmHg. Comparison(s): No prior Echocardiogram. FINDINGS  Left Ventricle: Left ventricular ejection fraction, by estimation, is 40 to 45%. The left ventricle has mildly decreased function. The left ventricle demonstrates global hypokinesis. Definity contrast agent was given IV to delineate the left ventricular  endocardial borders. The left ventricular internal cavity size was normal in size. There is mild concentric left ventricular hypertrophy. Left ventricular diastolic parameters are consistent with Grade II diastolic dysfunction (pseudonormalization). Elevated left atrial pressure. Right Ventricle: The right ventricular size is normal. No increase in right ventricular wall thickness. Right ventricular systolic function is low normal. There is moderately elevated pulmonary artery systolic pressure. The tricuspid regurgitant velocity  is 2.97 m/s, and with an assumed right atrial pressure of 15 mmHg, the estimated right ventricular systolic pressure is 0000000 mmHg. Left Atrium: Left atrial size was mildly dilated. Right Atrium: Right atrial size was mildly dilated. Pericardium: A moderately sized pericardial  effusion is present. The pericardial effusion is circumferential. There is no evidence of cardiac tamponade. Mitral Valve: The mitral valve is normal in structure. No evidence of mitral valve regurgitation. Tricuspid Valve: The tricuspid valve is normal in structure. Tricuspid valve regurgitation is mild to moderate. Aortic Valve: The aortic valve is normal in structure. Aortic valve regurgitation is not visualized. Pulmonic Valve: The pulmonic valve was normal in structure. Pulmonic valve regurgitation is trivial. Aorta: The aortic root and ascending aorta are structurally normal, with no evidence of dilitation. Venous: The inferior vena cava is dilated in size with less than 50% respiratory variability, suggesting right atrial pressure of 15 mmHg. IAS/Shunts: No atrial level shunt detected by color flow Doppler.  LEFT VENTRICLE PLAX 2D LVIDd:         5.00 cm      Diastology LVIDs:         4.00 cm      LV e' lateral:   5.51 cm/s LV PW:         1.30 cm      LV E/e' lateral: 20.3 LV IVS:        1.20 cm      LV e' medial:    4.30 cm/s LVOT diam:     2.00 cm      LV E/e' medial:  26.0 LV SV:         58 LV SV Index:   33 LVOT Area:     3.14 cm  LV Volumes (MOD) LV vol d, MOD A2C: 191.0 ml LV vol d, MOD A4C: 196.0 ml LV vol s, MOD A2C: 107.0 ml LV vol s, MOD A4C: 131.0 ml LV SV MOD A2C:     84.0 ml LV SV MOD A4C:     196.0 ml LV SV MOD BP:      72.8 ml RIGHT VENTRICLE             IVC RV S prime:     11.40 cm/s  IVC diam: 2.60 cm TAPSE (M-mode): 1.7 cm LEFT ATRIUM             Index       RIGHT ATRIUM           Index LA diam:        3.60 cm 2.03 cm/m  RA Area:     24.20 cm LA Vol (A2C):   63.5 ml 35.88 ml/m RA Volume:   80.70 ml  45.60 ml/m LA Vol (A4C):   74.6 ml 42.15 ml/m LA Biplane Vol: 68.8 ml 38.88 ml/m  AORTIC VALVE LVOT Vmax:   126.00  cm/s LVOT Vmean:  80.700 cm/s LVOT VTI:    0.185 m  AORTA Ao Root diam: 2.50 cm Ao Asc diam:  3.00 cm MITRAL VALVE                TRICUSPID VALVE MV Area (PHT): 5.54 cm     TR  Peak grad:   35.3 mmHg MV Decel Time: 137 msec     TR Vmax:        297.00 cm/s MV E velocity: 112.00 cm/s MV A velocity: 60.20 cm/s   SHUNTS MV E/A ratio:  1.86         Systemic VTI:  0.18 m                             Systemic Diam: 2.00 cm Dani Gobble Croitoru MD Electronically signed by Sanda Klein MD Signature Date/Time: 05/02/2020/9:56:04 AM    Final    VAS [place] LOWER EXTREMITY VENOUS (DVT)  Result Date: 05/02/2020  Lower Venous DVTStudy Indications: Edema.  Comparison Study: none Performing Technologist: June Leap RDMS, RVT  Examination Guidelines: A complete evaluation includes B-mode imaging, spectral Doppler, color Doppler, and power Doppler as needed of all accessible portions of each vessel. Bilateral testing is considered an integral part of a complete examination. Limited examinations for reoccurring indications may be performed as noted. The reflux portion of the exam is performed with the patient in reverse Trendelenburg.  +---------+---------------+---------+-----------+----------+--------------+ RIGHT    CompressibilityPhasicitySpontaneityPropertiesThrombus Aging +---------+---------------+---------+-----------+----------+--------------+ CFV      Full           Yes      Yes                                 +---------+---------------+---------+-----------+----------+--------------+ SFJ      Full                                                        +---------+---------------+---------+-----------+----------+--------------+ FV Prox  Full                                                        +---------+---------------+---------+-----------+----------+--------------+ FV Mid   Full                                                        +---------+---------------+---------+-----------+----------+--------------+ FV DistalFull                                                        +---------+---------------+---------+-----------+----------+--------------+ PFV       Full                                                        +---------+---------------+---------+-----------+----------+--------------+  POP      Full           Yes      Yes                                 +---------+---------------+---------+-----------+----------+--------------+ PTV      Full                                                        +---------+---------------+---------+-----------+----------+--------------+ PERO     Full                                                        +---------+---------------+---------+-----------+----------+--------------+   +---------+---------------+---------+-----------+----------+--------------+ LEFT     CompressibilityPhasicitySpontaneityPropertiesThrombus Aging +---------+---------------+---------+-----------+----------+--------------+ CFV      Full           Yes      Yes                                 +---------+---------------+---------+-----------+----------+--------------+ SFJ      Full                                                        +---------+---------------+---------+-----------+----------+--------------+ FV Prox  Full                                                        +---------+---------------+---------+-----------+----------+--------------+ FV Mid   Full                                                        +---------+---------------+---------+-----------+----------+--------------+ FV DistalFull                                                        +---------+---------------+---------+-----------+----------+--------------+ PFV      Full                                                        +---------+---------------+---------+-----------+----------+--------------+ POP      Full           Yes      Yes                                 +---------+---------------+---------+-----------+----------+--------------+  PTV      Full                                                         +---------+---------------+---------+-----------+----------+--------------+ PERO     Full                                                        +---------+---------------+---------+-----------+----------+--------------+     Summary: RIGHT: - There is no evidence of deep vein thrombosis in the lower extremity.  - No cystic structure found in the popliteal fossa.  LEFT: - There is no evidence of deep vein thrombosis in the lower extremity.  - No cystic structure found in the popliteal fossa.  *See table(s) above for measurements and observations. Electronically signed by Harold Barban MD on 05/02/2020 at 9:48:05 PM.    Final      Subjective: No acute issues or events overnight, no further palpitations, shortness of breath, edema, chest pain, nausea, vomiting, headache, fevers, chills.   Discharge Exam: Vitals:   05/02/20 2357 05/03/20 0640  BP: (!) 153/86 (!) 171/102  Pulse: 90 88  Resp: 18 18  Temp: 98.9 F (37.2 C) 99.3 F (37.4 C)  SpO2: 100% 100%   Vitals:   05/02/20 2006 05/02/20 2357 05/03/20 0630 05/03/20 0640  BP: (!) 142/81 (!) 153/86  (!) 171/102  Pulse: 92 90  88  Resp: 18 18  18   Temp: 99.1 F (37.3 C) 98.9 F (37.2 C)  99.3 F (37.4 C)  TempSrc: Oral Oral  Oral  SpO2: 100% 100%  100%  Weight:   72.8 kg   Height:       General:  Pleasantly resting in bed, No acute distress. HEENT:  Normocephalic atraumatic.  Sclerae nonicteric, noninjected.  Extraocular movements intact bilaterally. Neck:  Without mass or deformity.  Trachea is midline. Lungs:  Clear to auscultate bilaterally without rhonchi, wheeze, or rales. Heart:  Regular rate and rhythm.  Without murmurs, rubs, or gallops. Abdomen:  Soft, nontender, nondistended.  Without guarding or rebound. Extremities: Without cyanosis, clubbing, edema, or obvious deformity. Vascular:  Dorsalis pedis and posterior tibial pulses palpable bilaterally. Skin:  Warm and dry, no erythema, no  ulcerations.   The results of significant diagnostics from this hospitalization (including imaging, microbiology, ancillary and laboratory) are listed below for reference.     Microbiology: Recent Results (from the past 240 hour(s))  SARS Coronavirus 2 by RT PCR (hospital order, performed in [facility] hospital lab) Nasopharyngeal Nasopharyngeal Swab     Status: None   Collection Time: 05/01/20 12:59 PM   Specimen: Nasopharyngeal Swab  Result Value Ref Range Status   SARS Coronavirus 2 NEGATIVE NEGATIVE Final    Comment: (NOTE) SARS-CoV-2 target nucleic acids are NOT DETECTED. The SARS-CoV-2 RNA is generally detectable in upper and lower respiratory specimens during the acute phase of infection. The lowest concentration of SARS-CoV-2 viral copies this assay can detect is 250 copies / mL. A negative result does not preclude SARS-CoV-2 infection and should not be used as the sole basis for treatment or other patient management decisions.  A negative result may occur with improper specimen collection /  handling, submission of specimen other than nasopharyngeal swab, presence of viral mutation(s) within the areas targeted by this assay, and inadequate number of viral copies (<250 copies / mL). A negative result must be combined with clinical observations, patient history, and epidemiological information. Fact Sheet for Patients:   StrictlyIdeas.no Fact Sheet for Healthcare Providers: BankingDealers.co.za This test is not yet approved or cleared  by the [place] FDA and has been authorized for detection and/or diagnosis of SARS-CoV-2 by FDA under an Emergency Use Authorization (EUA).  This EUA will remain in effect (meaning this test can be used) for the duration of the COVID-19 declaration under Section 564(b)(1) of the Act, 21 U.S.C. section 360bbb-3(b)(1), unless the authorization is terminated or revoked sooner. Performed at  [facility], [place] [address], [place], [place] [address]      Labs: BNP (last 3 results) Recent Labs    05/01/20 1143  BNP 0000000*   Basic Metabolic Panel: Recent Labs  Lab 05/01/20 1143 05/02/20 0422 05/03/20 0350  NA 139 138 140  K 3.5 3.5 3.9  CL 107 107 111  CO2 19* 20* 21*  GLUCOSE 99 107* 101*  BUN 13 15 12   CREATININE 0.86 0.91 0.90  CALCIUM 9.4 8.6* 8.4*  MG  --  1.6*  --    Liver Function Tests: No results for input(s): AST, ALT, ALKPHOS, BILITOT, PROT, ALBUMIN in the last 168 hours. No results for input(s): LIPASE, AMYLASE in the last 168 hours. No results for input(s): AMMONIA in the last 168 hours. CBC: Recent Labs  Lab 05/01/20 1143 05/02/20 0422 05/02/20 1948 05/03/20 0350  WBC 4.9 5.2  --  6.9  NEUTROABS  --  3.3  --   --   HGB 4.0* 6.2* 8.5* 7.6*  HCT 17.4* 21.5* 29.2* 26.2*  MCV 55.8* 64.0*  --  67.4*  PLT 332 263  --  252   Cardiac Enzymes: No results for input(s): CKTOTAL, CKMB, CKMBINDEX, TROPONINI in the last 168 hours. BNP: Invalid input(s): POCBNP CBG: No results for input(s): GLUCAP in the last 168 hours. D-Dimer No results for input(s): DDIMER in the last 72 hours. Hgb A1c No results for input(s): HGBA1C in the last 72 hours. Lipid Profile No results for input(s): CHOL, HDL, LDLCALC, TRIG, CHOLHDL, LDLDIRECT in the last 72 hours. Thyroid function studies Recent Labs    05/02/20 0422  TSH 3.073   Anemia work up Recent Labs    05/01/20 1251  VITAMINB12 608  FOLATE 9.0  FERRITIN 2*  TIBC 423  IRON 13*  RETICCTPCT 1.8   Urinalysis No results found for: COLORURINE, APPEARANCEUR, LABSPEC, [place], GLUCOSEU, HGBUR, BILIRUBINUR, KETONESUR, PROTEINUR, UROBILINOGEN, NITRITE, LEUKOCYTESUR Sepsis Labs Invalid input(s): PROCALCITONIN,  WBC,  LACTICIDVEN Microbiology Recent Results (from the past 240 hour(s))  SARS Coronavirus 2 by RT PCR (hospital order, performed in [facility] hospital lab) Nasopharyngeal  Nasopharyngeal Swab     Status: None   Collection Time: 05/01/20 12:59 PM   Specimen: Nasopharyngeal Swab  Result Value Ref Range Status   SARS Coronavirus 2 NEGATIVE NEGATIVE Final    Comment: (NOTE) SARS-CoV-2 target nucleic acids are NOT DETECTED. The SARS-CoV-2 RNA is generally detectable in upper and lower respiratory specimens during the acute phase of infection. The lowest concentration of SARS-CoV-2 viral copies this assay can detect is 250 copies / mL. A negative result does not preclude SARS-CoV-2 infection and should not be used as the sole basis for treatment or other patient management decisions.  A  negative result may occur with improper specimen collection / handling, submission of specimen other than nasopharyngeal swab, presence of viral mutation(s) within the areas targeted by this assay, and inadequate number of viral copies (<250 copies / mL). A negative result must be combined with clinical observations, patient history, and epidemiological information. Fact Sheet for Patients:   StrictlyIdeas.no Fact Sheet for Healthcare Providers: BankingDealers.co.za This test is not yet approved or cleared  by the [place] FDA and has been authorized for detection and/or diagnosis of SARS-CoV-2 by FDA under an Emergency Use Authorization (EUA).  This EUA will remain in effect (meaning this test can be used) for the duration of the COVID-19 declaration under Section 564(b)(1) of the Act, 21 U.S.C. section 360bbb-3(b)(1), unless the authorization is terminated or revoked sooner. Performed at [facility], [place] [address] [place], [place] [address]     Time coordinating discharge: Over 30 minutes  SIGNED:  Little Ishikawa, DO [facility] 05/03/2020, 7:30 AM

## 2020-05-17 ENCOUNTER — Ambulatory Visit: Payer: Medicaid Other

## 2020-06-21 ENCOUNTER — Institutional Professional Consult (permissible substitution): Payer: Medicaid Other | Admitting: Internal Medicine

## 2020-08-08 ENCOUNTER — Institutional Professional Consult (permissible substitution): Payer: Medicaid Other | Admitting: Internal Medicine

## 2020-08-28 NOTE — Progress Notes (Deleted)
Electrophysiology Office Note:    Date:  12/31/6220   ID:  Holly Bradley, DOB [birth year], MRN [id number]  PCP:  Patient, No Pcp Per  [facility] Cardiologist:  Glenetta Hew, MD  [facility] Electrophysiologist:  Vickie Epley, MD   Referring MD: No ref. provider found   Chief Complaint: SVT  History of Present Illness:    Holly Bradley is a 44 y.o. female with a hx of hypertension, chronic anemia, uterine fibroids, paroxysmal SVT who is being seen at the request of Dr. Ellyn Hack for an opinion regarding her SVT.  She was first diagnosed with SVT 2 years ago in [place] and was prescribed nifedipine.  This has been not controlled her symptoms.  She most recently presented to [facility] emergency department on May 10 where she was noted to be in an SVT.  1 dose of adenosine was given which controlled her heart rate.  Further work-up of her SVT revealed a hemoglobin of 4.0.  During that hospitalization, carvedilol was started and she was referred to electrophysiology for consideration of EP study and ablation.  She was transfused.  Also during the hospitalization, echocardiogram was performed demonstrating an ejection fraction of 40 to 45%.  Past Medical History:  Diagnosis Date  . Chronic blood loss anemia    Related to uterine fibroids  . Essential hypertension    Poorly controlled, repeat by report only on nicardipine 90 mg  . Paroxysmal SVT (supraventricular tachycardia) (HCC)    Frequent episodes, can last anywhere from 20 minutes to 12-15 hours.  Not on beta-blocker or calcium channel blocker  . Uterine fibroids affecting pregnancy    Likely complicated by by menometrorrhagia and chronic anemia    Past Surgical History:  Procedure Laterality Date  . Unknown      Current Medications: No outpatient medications have been marked as taking for the 08/29/20 encounter (Appointment) with Vickie Epley, MD.     Allergies:   Patient has no known allergies.   Social  History   Socioeconomic History  . Marital status: Single    Spouse name: Not on file  . Number of children: Not on file  . Years of education: Not on file  . Highest education level: Not on file  Occupational History  . Not on file  Tobacco Use  . Smoking status: Not on file  Substance and Sexual Activity  . Alcohol use: Not on file  . Drug use: Not on file  . Sexual activity: Not on file  Other Topics Concern  . Not on file  Social History Narrative  . Not on file   Social Determinants of Health   Financial Resource Strain:   . Difficulty of Paying Living Expenses: Not on file  Food Insecurity:   . Worried About Charity fundraiser in the Last Year: Not on file  . Ran Out of Food in the Last Year: Not on file  Transportation Needs:   . Lack of Transportation (Medical): Not on file  . Lack of Transportation (Non-Medical): Not on file  Physical Activity:   . Days of Exercise per Week: Not on file  . Minutes of Exercise per Session: Not on file  Stress:   . Feeling of Stress : Not on file  Social Connections:   . Frequency of Communication with Friends and Family: Not on file  . Frequency of Social Gatherings with Friends and Family: Not on file  . Attends Religious Services: Not on file  .  Active Member of Clubs or Organizations: Not on file  . Attends Archivist Meetings: Not on file  . Marital Status: Not on file     Family History: The patient's Family history is unknown by patient.  ROS:   Please see the history of present illness.    All other systems reviewed and are negative.  EKGs/Labs/Other Studies Reviewed:    The following studies were reviewed today:   05/02/2020 Echocardiogram 1. Left ventricular ejection fraction, by estimation, is 40 to 45%. The  left ventricle has mildly decreased function. The left ventricle  demonstrates global hypokinesis. There is mild concentric left ventricular  hypertrophy. Left ventricular diastolic    parameters are consistent with Grade II diastolic dysfunction  (pseudonormalization). Elevated left atrial pressure.  2. Right ventricular systolic function is low normal. The right  ventricular size is normal. There is moderately elevated pulmonary artery  systolic pressure.  3. Left atrial size was mildly dilated.  4. Right atrial size was mildly dilated.  5. Moderate pericardial effusion. The pericardial effusion is  circumferential. There is no evidence of cardiac tamponade.  6. The mitral valve is normal in structure. No evidence of mitral valve  regurgitation.  7. Tricuspid valve regurgitation is mild to moderate.  8. The aortic valve is normal in structure. Aortic valve regurgitation is  not visualized.  9. The inferior vena cava is dilated in size with <50% respiratory  variability, suggesting right atrial pressure of 15 mmHg.   EKG:  The ekg ordered today demonstrates ***  Recent Labs: 05/01/2020: B Natriuretic Peptide 2,782.5 05/02/2020: Magnesium 1.6; TSH 3.073 05/03/2020: BUN 12; Creatinine, Ser 0.90; Hemoglobin 7.6; Platelets 252; Potassium 3.9; Sodium 140  Recent Lipid Panel No results found for: CHOL, TRIG, HDL, CHOLHDL, VLDL, LDLCALC, LDLDIRECT  Physical Exam:    VS:  There were no vitals taken for this visit.    Wt Readings from Last 3 Encounters:  05/03/20 160 lb 8 oz (72.8 kg)     GEN: *** Well nourished, well developed in no acute distress HEENT: Normal NECK: No JVD; No carotid bruits LYMPHATICS: No lymphadenopathy CARDIAC: ***RRR, no murmurs, rubs, gallops RESPIRATORY:  Clear to auscultation without rales, wheezing or rhonchi  ABDOMEN: Soft, non-tender, non-distended MUSCULOSKELETAL:  No edema; No deformity  SKIN: Warm and dry NEUROLOGIC:  Alert and oriented x 3 PSYCHIATRIC:  Normal affect   ASSESSMENT:    1. SVT (supraventricular tachycardia) ([place])   2. Left ventricular dysfunction   3. Anemia, unspecified type   4. Pericardial effusion     PLAN:    In order of problems listed above:  1. supraventricular tachycardia Patient was admitted in May 2021 with a supraventricular tachycardia.  As part of the work-up she was found to have a significant anemia attributed at that time to uterine fibroids.  2.  Left ventricular dysfunction Patient was found to have a depressed ejection fraction during the May 2021 hospitalization.  Unclear etiology.  Left ventricle appears to be at least mildly hypertrophied on surface echocardiogram.  There was a moderate pericardial effusion as well on that echo. -Plan for cardiac MRI to better characterize the left ventricular structure and to assess for evidence of fibrosis -Continue goal-directed medical therapy  3.  Anemia Initially presented with a hemoglobin of 4 and was transfused 3 units of PRBCs.  Discharge hemoglobin was 7.6.  Attributed originally to uterine fibroids.  Given her age, gender, pericardial effusion I am somewhat suspicious of another etiology of her anemia  at least contributing. -Recheck CBC, CMP, ANA, TSH  4.  Pericardial effusion Cardiac MRI as above  Medication Adjustments/Labs and Tests Ordered: Current medicines are reviewed at length with the patient today.  Concerns regarding medicines are outlined above.  No orders of the defined types were placed in this encounter.  No orders of the defined types were placed in this encounter.   There are no Patient Instructions on file for this visit.   Signed, Lars Mage, MD, [facility]  08/28/2020 4:50 PM    Electrophysiology [facility]

## 2020-08-29 ENCOUNTER — Institutional Professional Consult (permissible substitution): Payer: Medicaid Other | Admitting: Cardiology

## 2021-01-10 ENCOUNTER — Encounter (HOSPITAL_COMMUNITY): Payer: Self-pay

## 2021-01-10 ENCOUNTER — Emergency Department (HOSPITAL_COMMUNITY): Payer: BLUE CROSS/BLUE SHIELD

## 2021-01-10 ENCOUNTER — Other Ambulatory Visit: Payer: Self-pay

## 2021-01-10 ENCOUNTER — Inpatient Hospital Stay (HOSPITAL_COMMUNITY)
Admission: EM | Admit: 2021-01-10 | Discharge: 2021-01-27 | DRG: 291 | Disposition: A | Discharge status: Home / Self Care | Payer: BLUE CROSS/BLUE SHIELD | Attending: Student in an Organized Health Care Education/Training Program | Admitting: Student in an Organized Health Care Education/Training Program

## 2021-01-10 DIAGNOSIS — N131 Hydronephrosis with ureteral stricture, not elsewhere classified: Secondary | ICD-10-CM | POA: Diagnosis present

## 2021-01-10 DIAGNOSIS — Z833 Family history of diabetes mellitus: Secondary | ICD-10-CM

## 2021-01-10 DIAGNOSIS — I5082 Biventricular heart failure: Secondary | ICD-10-CM | POA: Diagnosis present

## 2021-01-10 DIAGNOSIS — I428 Other cardiomyopathies: Secondary | ICD-10-CM | POA: Diagnosis present

## 2021-01-10 DIAGNOSIS — R1900 Intra-abdominal and pelvic swelling, mass and lump, unspecified site: Secondary | ICD-10-CM | POA: Diagnosis present

## 2021-01-10 DIAGNOSIS — R410 Disorientation, unspecified: Secondary | ICD-10-CM | POA: Diagnosis not present

## 2021-01-10 DIAGNOSIS — I5041 Acute combined systolic (congestive) and diastolic (congestive) heart failure: Secondary | ICD-10-CM | POA: Diagnosis not present

## 2021-01-10 DIAGNOSIS — D259 Leiomyoma of uterus, unspecified: Secondary | ICD-10-CM | POA: Diagnosis present

## 2021-01-10 DIAGNOSIS — R54 Age-related physical debility: Secondary | ICD-10-CM | POA: Diagnosis present

## 2021-01-10 DIAGNOSIS — I110 Hypertensive heart disease with heart failure: Secondary | ICD-10-CM | POA: Diagnosis not present

## 2021-01-10 DIAGNOSIS — R17 Unspecified jaundice: Secondary | ICD-10-CM | POA: Diagnosis present

## 2021-01-10 DIAGNOSIS — Z7189 Other specified counseling: Secondary | ICD-10-CM | POA: Diagnosis not present

## 2021-01-10 DIAGNOSIS — Z9111 Patient's noncompliance with dietary regimen: Secondary | ICD-10-CM

## 2021-01-10 DIAGNOSIS — F0150 Vascular dementia without behavioral disturbance: Secondary | ICD-10-CM | POA: Diagnosis present

## 2021-01-10 DIAGNOSIS — F600 Paranoid personality disorder: Secondary | ICD-10-CM | POA: Diagnosis present

## 2021-01-10 DIAGNOSIS — E876 Hypokalemia: Secondary | ICD-10-CM | POA: Diagnosis not present

## 2021-01-10 DIAGNOSIS — Z91138 Patient's unintentional underdosing of medication regimen for other reason: Secondary | ICD-10-CM

## 2021-01-10 DIAGNOSIS — J1282 Pneumonia due to coronavirus disease 2019: Secondary | ICD-10-CM | POA: Diagnosis present

## 2021-01-10 DIAGNOSIS — T447X6A Underdosing of beta-adrenoreceptor antagonists, initial encounter: Secondary | ICD-10-CM | POA: Diagnosis present

## 2021-01-10 DIAGNOSIS — I471 Supraventricular tachycardia: Secondary | ICD-10-CM | POA: Diagnosis present

## 2021-01-10 DIAGNOSIS — I130 Hypertensive heart and chronic kidney disease with heart failure and stage 1 through stage 4 chronic kidney disease, or unspecified chronic kidney disease: Secondary | ICD-10-CM | POA: Diagnosis present

## 2021-01-10 DIAGNOSIS — Z87891 Personal history of nicotine dependence: Secondary | ICD-10-CM

## 2021-01-10 DIAGNOSIS — Z79899 Other long term (current) drug therapy: Secondary | ICD-10-CM

## 2021-01-10 DIAGNOSIS — I4589 Other specified conduction disorders: Secondary | ICD-10-CM | POA: Diagnosis present

## 2021-01-10 DIAGNOSIS — U071 COVID-19: Secondary | ICD-10-CM | POA: Diagnosis present

## 2021-01-10 DIAGNOSIS — I5043 Acute on chronic combined systolic (congestive) and diastolic (congestive) heart failure: Secondary | ICD-10-CM | POA: Diagnosis present

## 2021-01-10 DIAGNOSIS — I161 Hypertensive emergency: Secondary | ICD-10-CM | POA: Diagnosis present

## 2021-01-10 DIAGNOSIS — T501X6A Underdosing of loop [high-ceiling] diuretics, initial encounter: Secondary | ICD-10-CM | POA: Diagnosis present

## 2021-01-10 DIAGNOSIS — R778 Other specified abnormalities of plasma proteins: Secondary | ICD-10-CM | POA: Diagnosis present

## 2021-01-10 DIAGNOSIS — I361 Nonrheumatic tricuspid (valve) insufficiency: Secondary | ICD-10-CM | POA: Diagnosis not present

## 2021-01-10 DIAGNOSIS — Z452 Encounter for adjustment and management of vascular access device: Secondary | ICD-10-CM

## 2021-01-10 DIAGNOSIS — D219 Benign neoplasm of connective and other soft tissue, unspecified: Secondary | ICD-10-CM | POA: Diagnosis present

## 2021-01-10 DIAGNOSIS — T464X6A Underdosing of angiotensin-converting-enzyme inhibitors, initial encounter: Secondary | ICD-10-CM | POA: Diagnosis present

## 2021-01-10 DIAGNOSIS — F22 Delusional disorders: Secondary | ICD-10-CM | POA: Diagnosis not present

## 2021-01-10 DIAGNOSIS — N1330 Unspecified hydronephrosis: Secondary | ICD-10-CM

## 2021-01-10 DIAGNOSIS — I313 Pericardial effusion (noninflammatory): Secondary | ICD-10-CM | POA: Diagnosis present

## 2021-01-10 DIAGNOSIS — E872 Acidosis: Secondary | ICD-10-CM | POA: Diagnosis present

## 2021-01-10 DIAGNOSIS — D649 Anemia, unspecified: Secondary | ICD-10-CM | POA: Diagnosis present

## 2021-01-10 DIAGNOSIS — D500 Iron deficiency anemia secondary to blood loss (chronic): Secondary | ICD-10-CM | POA: Diagnosis present

## 2021-01-10 DIAGNOSIS — E871 Hypo-osmolality and hyponatremia: Secondary | ICD-10-CM | POA: Diagnosis not present

## 2021-01-10 DIAGNOSIS — I509 Heart failure, unspecified: Secondary | ICD-10-CM

## 2021-01-10 DIAGNOSIS — F29 Unspecified psychosis not due to a substance or known physiological condition: Secondary | ICD-10-CM | POA: Diagnosis not present

## 2021-01-10 DIAGNOSIS — R509 Fever, unspecified: Secondary | ICD-10-CM | POA: Diagnosis not present

## 2021-01-10 DIAGNOSIS — D509 Iron deficiency anemia, unspecified: Secondary | ICD-10-CM | POA: Diagnosis not present

## 2021-01-10 DIAGNOSIS — I5023 Acute on chronic systolic (congestive) heart failure: Secondary | ICD-10-CM | POA: Diagnosis not present

## 2021-01-10 DIAGNOSIS — F28 Other psychotic disorder not due to a substance or known physiological condition: Secondary | ICD-10-CM | POA: Diagnosis not present

## 2021-01-10 DIAGNOSIS — N921 Excessive and frequent menstruation with irregular cycle: Secondary | ICD-10-CM | POA: Diagnosis present

## 2021-01-10 DIAGNOSIS — I2721 Secondary pulmonary arterial hypertension: Secondary | ICD-10-CM | POA: Diagnosis present

## 2021-01-10 DIAGNOSIS — N179 Acute kidney failure, unspecified: Secondary | ICD-10-CM | POA: Diagnosis present

## 2021-01-10 DIAGNOSIS — N920 Excessive and frequent menstruation with regular cycle: Secondary | ICD-10-CM | POA: Diagnosis not present

## 2021-01-10 DIAGNOSIS — I472 Ventricular tachycardia: Secondary | ICD-10-CM | POA: Diagnosis not present

## 2021-01-10 DIAGNOSIS — R600 Localized edema: Secondary | ICD-10-CM | POA: Diagnosis not present

## 2021-01-10 DIAGNOSIS — I429 Cardiomyopathy, unspecified: Secondary | ICD-10-CM | POA: Diagnosis not present

## 2021-01-10 DIAGNOSIS — F419 Anxiety disorder, unspecified: Secondary | ICD-10-CM | POA: Diagnosis present

## 2021-01-10 DIAGNOSIS — R188 Other ascites: Secondary | ICD-10-CM

## 2021-01-10 DIAGNOSIS — R222 Localized swelling, mass and lump, trunk: Secondary | ICD-10-CM | POA: Diagnosis not present

## 2021-01-10 DIAGNOSIS — I340 Nonrheumatic mitral (valve) insufficiency: Secondary | ICD-10-CM | POA: Diagnosis not present

## 2021-01-10 DIAGNOSIS — N189 Chronic kidney disease, unspecified: Secondary | ICD-10-CM | POA: Diagnosis present

## 2021-01-10 HISTORY — DX: Benign neoplasm of connective and other soft tissue, unspecified: D21.9

## 2021-01-10 LAB — TSH: TSH: 5.282 u[IU]/mL — ABNORMAL HIGH (ref 0.350–4.500)

## 2021-01-10 LAB — CBC WITH DIFFERENTIAL/PLATELET
Abs Immature Granulocytes: 0.06 10*3/uL (ref 0.00–0.07)
Basophils Absolute: 0 10*3/uL (ref 0.0–0.1)
Basophils Relative: 0 %
Eosinophils Absolute: 0 10*3/uL (ref 0.0–0.5)
Eosinophils Relative: 0 %
HCT: 34.1 % — ABNORMAL LOW (ref 36.0–46.0)
Hemoglobin: 9.5 g/dL — ABNORMAL LOW (ref 12.0–15.0)
Immature Granulocytes: 1 %
Lymphocytes Relative: 3 %
Lymphs Abs: 0.3 10*3/uL — ABNORMAL LOW (ref 0.7–4.0)
MCH: 18.1 pg — ABNORMAL LOW (ref 26.0–34.0)
MCHC: 27.9 g/dL — ABNORMAL LOW (ref 30.0–36.0)
MCV: 64.8 fL — ABNORMAL LOW (ref 80.0–100.0)
Monocytes Absolute: 0.7 10*3/uL (ref 0.1–1.0)
Monocytes Relative: 7 %
Neutro Abs: 9.1 10*3/uL — ABNORMAL HIGH (ref 1.7–7.7)
Neutrophils Relative %: 89 %
Platelets: 284 10*3/uL (ref 150–400)
RBC: 5.26 MIL/uL — ABNORMAL HIGH (ref 3.87–5.11)
RDW: 20.5 % — ABNORMAL HIGH (ref 11.5–15.5)
WBC: 10.2 10*3/uL (ref 4.0–10.5)
nRBC: 6.4 % — ABNORMAL HIGH (ref 0.0–0.2)

## 2021-01-10 LAB — BASIC METABOLIC PANEL
Anion gap: 21 — ABNORMAL HIGH (ref 5–15)
BUN: 57 mg/dL — ABNORMAL HIGH (ref 6–20)
CO2: 15 mmol/L — ABNORMAL LOW (ref 22–32)
Calcium: 10.5 mg/dL — ABNORMAL HIGH (ref 8.9–10.3)
Chloride: 107 mmol/L (ref 98–111)
Creatinine, Ser: 1.92 mg/dL — ABNORMAL HIGH (ref 0.44–1.00)
GFR, Estimated: 32 mL/min — ABNORMAL LOW (ref >60)
Glucose, Bld: 104 mg/dL — ABNORMAL HIGH (ref 70–99)
Potassium: 4.9 mmol/L (ref 3.5–5.1)
Sodium: 143 mmol/L (ref 135–145)

## 2021-01-10 LAB — BRAIN NATRIURETIC PEPTIDE: B Natriuretic Peptide: >4500 pg/mL — ABNORMAL HIGH (ref 0.0–100.0)

## 2021-01-10 LAB — LACTIC ACID, PLASMA
Lactic Acid, Venous: 3 mmol/L (ref 0.5–1.9)
Lactic Acid, Venous: 3 mmol/L (ref 0.5–1.9)

## 2021-01-10 LAB — AMMONIA: Ammonia: 33 umol/L (ref 9–35)

## 2021-01-10 LAB — TROPONIN I (HIGH SENSITIVITY)
Troponin I (High Sensitivity): 802 ng/L (ref <18)
Troponin I (High Sensitivity): 922 ng/L (ref <18)
Troponin I (High Sensitivity): 888 ng/L (ref <18)

## 2021-01-10 LAB — I-STAT VENOUS BLOOD GAS, ED
Acid-base deficit: 6 mmol/L — ABNORMAL HIGH (ref 0.0–2.0)
Bicarbonate: 16.2 mmol/L — ABNORMAL LOW (ref 20.0–28.0)
Calcium, Ion: 1.01 mmol/L — ABNORMAL LOW (ref 1.15–1.40)
HCT: 32 % — ABNORMAL LOW (ref 36.0–46.0)
Hemoglobin: 10.9 g/dL — ABNORMAL LOW (ref 12.0–15.0)
O2 Saturation: 90 %
Potassium: 4.4 mmol/L (ref 3.5–5.1)
Sodium: 142 mmol/L (ref 135–145)
TCO2: 17 mmol/L — ABNORMAL LOW (ref 22–32)
pCO2, Ven: 22.5 mmHg — ABNORMAL LOW (ref 44.0–60.0)
pH, Ven: 7.465 — ABNORMAL HIGH (ref 7.250–7.430)
pO2, Ven: 54 mmHg — ABNORMAL HIGH (ref 32.0–45.0)

## 2021-01-10 LAB — D-DIMER, QUANTITATIVE: D-Dimer, Quant: 3.06 ug/mL-FEU — ABNORMAL HIGH (ref 0.00–0.50)

## 2021-01-10 LAB — IRON AND TIBC
Iron: 18 ug/dL — ABNORMAL LOW (ref 28–170)
Saturation Ratios: 5 % — ABNORMAL LOW (ref 10.4–31.8)
TIBC: 363 ug/dL (ref 250–450)
UIBC: 345 ug/dL

## 2021-01-10 LAB — HEPATIC FUNCTION PANEL
ALT: 34 U/L (ref 0–44)
AST: 52 U/L — ABNORMAL HIGH (ref 15–41)
Albumin: 3.1 g/dL — ABNORMAL LOW (ref 3.5–5.0)
Alkaline Phosphatase: 61 U/L (ref 38–126)
Bilirubin, Direct: 0.7 mg/dL — ABNORMAL HIGH (ref 0.0–0.2)
Indirect Bilirubin: 2.1 mg/dL — ABNORMAL HIGH (ref 0.3–0.9)
Total Bilirubin: 2.8 mg/dL — ABNORMAL HIGH (ref 0.3–1.2)
Total Protein: 6.4 g/dL — ABNORMAL LOW (ref 6.5–8.1)

## 2021-01-10 LAB — RESP PANEL BY RT-PCR (FLU A&B, COVID) ARPGX2
Influenza A by PCR: NEGATIVE
Influenza B by PCR: NEGATIVE
SARS Coronavirus 2 by RT PCR: POSITIVE — AB

## 2021-01-10 LAB — HEMOGLOBIN A1C
Hgb A1c MFr Bld: 5.6 % (ref 4.8–5.6)
Mean Plasma Glucose: 114.02 mg/dL

## 2021-01-10 LAB — C-REACTIVE PROTEIN: CRP: 4.2 mg/dL — ABNORMAL HIGH (ref <1.0)

## 2021-01-10 LAB — FERRITIN: Ferritin: 12 ng/mL (ref 11–307)

## 2021-01-10 LAB — PROTIME-INR
INR: 1.3 — ABNORMAL HIGH (ref 0.8–1.2)
Prothrombin Time: 15.7 seconds — ABNORMAL HIGH (ref 11.4–15.2)

## 2021-01-10 LAB — FIBRINOGEN: Fibrinogen: 384 mg/dL (ref 210–475)

## 2021-01-10 LAB — MAGNESIUM
Magnesium: 2.4 mg/dL (ref 1.7–2.4)
Magnesium: 2.1 mg/dL (ref 1.7–2.4)

## 2021-01-10 LAB — PROCALCITONIN: Procalcitonin: 2.62 ng/mL

## 2021-01-10 LAB — APTT: aPTT: 25 seconds (ref 24–36)

## 2021-01-10 IMAGING — DX DG CHEST 1V PORT
1 series · 1 of 1 positions shown · non-contrast
Comparison: None.

CLINICAL DATA: Fever and weakness.  Dizziness

EXAM:
PORTABLE CHEST 1 VIEW

[chest ap]
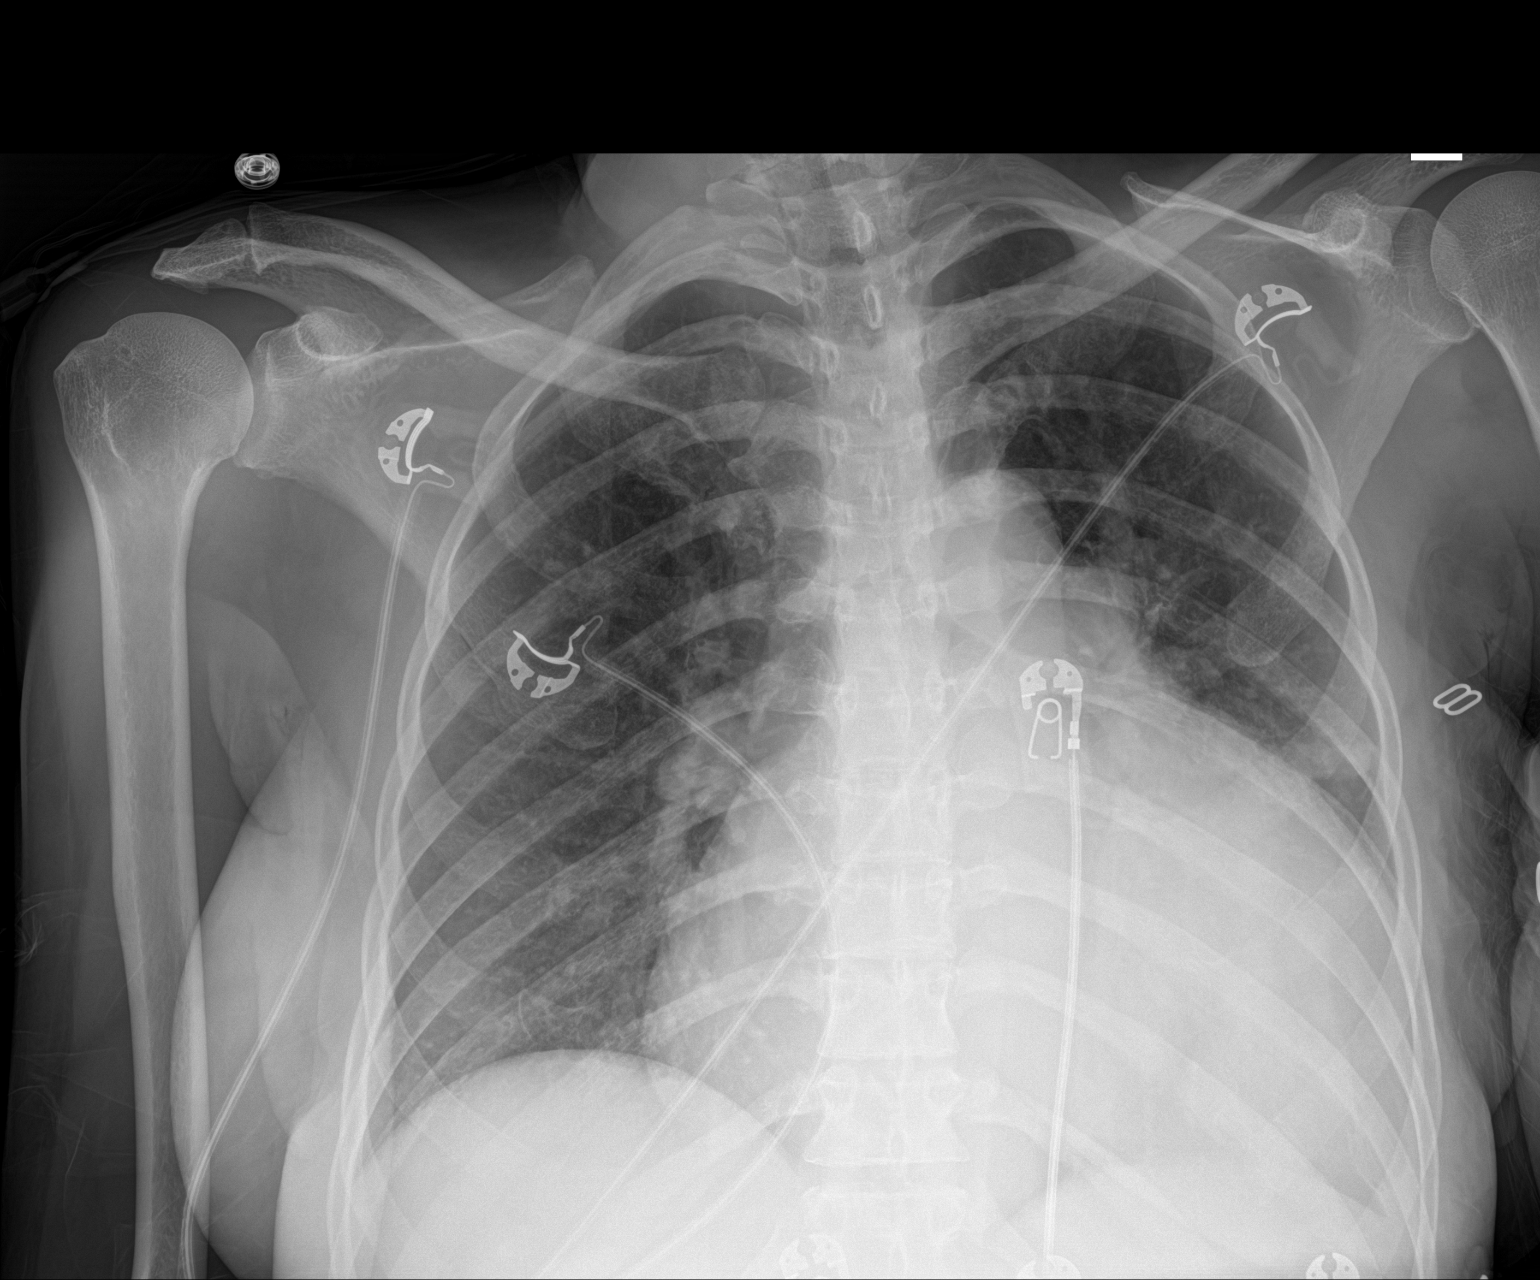

[1 of 1 positions shown; findings below may reference images not displayed]

FINDINGS: Cardiopericardial enlargement, chronic. Poor visualization of the
retrocardiac lung likely related to heart size, also seen
previously. No edema, effusion, or pneumothorax.
IMPRESSION: Chronic Cardiopericardial enlargement.  No pulmonary edema.

## 2021-01-10 MED ORDER — ADENOSINE 6 MG/2ML IV SOLN
INTRAVENOUS | Status: AC
  Administered 2021-01-10: 12 mg
  Filled 2021-01-10: qty 4

## 2021-01-10 MED ORDER — PANTOPRAZOLE SODIUM 40 MG PO TBEC
40.0000 mg | DELAYED_RELEASE_TABLET | Freq: Every day | ORAL | Status: DC
  Administered 2021-01-10 – 2021-01-17 (×8): 40 mg via ORAL
  Filled 2021-01-10 (×7): qty 1

## 2021-01-10 MED ORDER — NITROGLYCERIN IN D5W 200-5 MCG/ML-% IV SOLN
0.0000 ug/min | INTRAVENOUS | Status: DC
Start: 2021-01-10 — End: 2021-01-10

## 2021-01-10 MED ORDER — ACETAMINOPHEN 325 MG PO TABS
650.0000 mg | ORAL_TABLET | ORAL | Status: DC | PRN
  Administered 2021-01-13 – 2021-01-22 (×5): 650 mg via ORAL
  Filled 2021-01-10 (×5): qty 2

## 2021-01-10 MED ORDER — HYDRALAZINE HCL 25 MG PO TABS: 25.0000 mg | ORAL_TABLET | Freq: Three times a day (TID) | ORAL | Status: DC

## 2021-01-10 MED ORDER — LABETALOL HCL 5 MG/ML IV SOLN
10.0000 mg | Freq: Four times a day (QID) | INTRAVENOUS | Status: DC | PRN
  Administered 2021-01-10: 10 mg via INTRAVENOUS
  Filled 2021-01-10: qty 4

## 2021-01-10 MED ORDER — ADENOSINE 6 MG/2ML IV SOLN
INTRAVENOUS | Status: AC
  Administered 2021-01-10: 12 mg via INTRAVENOUS
  Filled 2021-01-10: qty 4

## 2021-01-10 MED ORDER — SODIUM CHLORIDE 0.9 % IV BOLUS: 1000.0000 mL | Freq: Once | INTRAVENOUS | Status: DC

## 2021-01-10 MED ORDER — ADENOSINE 6 MG/2ML IV SOLN: 6.0000 mg | Freq: Once | INTRAVENOUS | Status: AC

## 2021-01-10 MED ORDER — NITROGLYCERIN IN D5W 200-5 MCG/ML-% IV SOLN
0.0000 ug/min | INTRAVENOUS | Status: DC
  Administered 2021-01-10: 5 ug/min via INTRAVENOUS
  Administered 2021-01-10: 50 ug/min via INTRAVENOUS
  Administered 2021-01-11: 70 ug/min via INTRAVENOUS
  Administered 2021-01-12: 25 ug/min via INTRAVENOUS
  Administered 2021-01-12: 20 ug/min via INTRAVENOUS
  Administered 2021-01-13: 5 ug/min via INTRAVENOUS
  Filled 2021-01-10 (×4): qty 250

## 2021-01-10 MED ORDER — ADENOSINE 6 MG/2ML IV SOLN
INTRAVENOUS | Status: AC
  Administered 2021-01-10: 6 mg via INTRAVENOUS
  Filled 2021-01-10: qty 6

## 2021-01-10 MED ORDER — ACETAMINOPHEN 500 MG PO TABS
1000.0000 mg | ORAL_TABLET | Freq: Once | ORAL | Status: AC
  Administered 2021-01-10: 1000 mg via ORAL
  Filled 2021-01-10: qty 2

## 2021-01-10 MED ORDER — SODIUM CHLORIDE 0.9 % IV BOLUS
500.0000 mL | Freq: Once | INTRAVENOUS | Status: AC
  Administered 2021-01-10: 500 mL via INTRAVENOUS

## 2021-01-10 MED ORDER — CARVEDILOL 6.25 MG PO TABS: 6.2500 mg | ORAL_TABLET | Freq: Two times a day (BID) | ORAL | Status: DC

## 2021-01-10 MED ORDER — HYDRALAZINE HCL 50 MG PO TABS
50.0000 mg | ORAL_TABLET | Freq: Three times a day (TID) | ORAL | Status: DC
  Administered 2021-01-10 – 2021-01-11 (×2): 50 mg via ORAL
  Filled 2021-01-10 (×2): qty 1

## 2021-01-10 MED ORDER — ADENOSINE 6 MG/2ML IV SOLN: 12.0000 mg | Freq: Once | INTRAVENOUS | Status: AC

## 2021-01-10 MED ORDER — HEPARIN SODIUM (PORCINE) 5000 UNIT/ML IJ SOLN
5000.0000 [IU] | Freq: Three times a day (TID) | INTRAMUSCULAR | Status: DC
  Administered 2021-01-10 – 2021-01-13 (×8): 5000 [IU] via SUBCUTANEOUS
  Filled 2021-01-10 (×8): qty 1

## 2021-01-10 MED ORDER — ADENOSINE 6 MG/2ML IV SOLN
6.0000 mg | Freq: Once | INTRAVENOUS | Status: AC
  Administered 2021-01-10: 6 mg via INTRAVENOUS
  Filled 2021-01-10: qty 2

## 2021-01-10 MED ORDER — FUROSEMIDE 10 MG/ML IJ SOLN
40.0000 mg | Freq: Two times a day (BID) | INTRAMUSCULAR | Status: DC
  Administered 2021-01-10 – 2021-01-14 (×8): 40 mg via INTRAVENOUS
  Filled 2021-01-10 (×8): qty 4

## 2021-01-10 MED ORDER — HYDRALAZINE HCL 20 MG/ML IJ SOLN: 10.0000 mg | Freq: Four times a day (QID) | INTRAMUSCULAR | Status: DC | PRN

## 2021-01-10 MED ORDER — METOPROLOL TARTRATE 25 MG PO TABS
25.0000 mg | ORAL_TABLET | Freq: Three times a day (TID) | ORAL | Status: DC
  Administered 2021-01-10 – 2021-01-12 (×5): 25 mg via ORAL
  Filled 2021-01-10 (×5): qty 1

## 2021-01-10 NOTE — ED Provider Notes (Signed)
[facility] EMERGENCY DEPARTMENT Provider Note   CSN: [id number] Arrival date & time: 01/10/21  1020     History Chief Complaint  Patient presents with  . Weakness  . Dizziness  . Leg Swelling    Holly Bradley is a 45 y.o. female.  Patient originally from [place] currently lives in [place], history of SVT, heart failure, uterine fibroids with recurrent anemia requiring blood transfusion in the past, high blood pressure presents for feeling generally weak..        Past Medical History:  Diagnosis Date  . Chronic blood loss anemia    Related to uterine fibroids  . Essential hypertension    Poorly controlled, repeat by report only on nicardipine 90 mg  . Paroxysmal SVT (supraventricular tachycardia) (HCC)    Frequent episodes, can last anywhere from 20 minutes to 12-15 hours.  Not on beta-blocker or calcium channel blocker  . Uterine fibroids affecting pregnancy    Likely complicated by by menometrorrhagia and chronic anemia    Patient Active Problem List   Diagnosis Date Noted  . SVT (supraventricular tachycardia) ([place]) 05/01/2020  . CHF (congestive heart failure) ([place]) 05/01/2020    Past Surgical History:  Procedure Laterality Date  . Unknown       OB History   No obstetric history on file.     Family History  Family history unknown: Yes       Home Medications Prior to Admission medications   Medication Sig Start Date End Date Taking? Authorizing Provider  carvedilol (COREG) 6.25 MG tablet Take 2 tablets (12.5 mg total) by mouth 2 (two) times daily with a meal. 05/03/20   Little Ishikawa, MD  furosemide (LASIX) 40 MG tablet Take 1 tablet (40 mg total) by mouth daily. 05/03/20 05/03/21  Little Ishikawa, MD  lisinopril (ZESTRIL) 20 MG tablet Take 2 tablets (40 mg total) by mouth daily. 05/03/20   Little Ishikawa, MD    Allergies    Patient has no known allergies.  Review of Systems   Review of Systems  Physical  Exam Updated Vital Signs BP (!) 181/127 (BP Location: Left Arm)   Pulse (!) 107   Temp (!) 101 F (38.3 C) (Oral)   Resp (!) 22   Ht 5\' 6"  (1.676 m)   Wt 56.7 kg   SpO2 98%   BMI 20.18 kg/m   Physical Exam Vitals and nursing note reviewed.  Constitutional:      Appearance: She is well-developed and well-nourished. She is ill-appearing.  HENT:     Head: Normocephalic and atraumatic.     Comments: Severe dry mm    Mouth/Throat:     Mouth: Mucous membranes are dry.  Eyes:     General: Scleral icterus present.        Right eye: No discharge.        Left eye: No discharge.     Conjunctiva/sclera: Conjunctivae normal.  Neck:     Trachea: No tracheal deviation.  Cardiovascular:     Rate and Rhythm: Regular rhythm. Tachycardia present.  Pulmonary:     Effort: Pulmonary effort is normal.     Breath sounds: Normal breath sounds.  Abdominal:     General: There is no distension.     Palpations: Abdomen is soft. There is mass (fibroids large central and lower abdomen).     Tenderness: There is no abdominal tenderness. There is no guarding.  Musculoskeletal:        General: Swelling (  mild LE bilateral) present. No edema.     Cervical back: Normal range of motion and neck supple.  Skin:    General: Skin is warm.     Findings: No rash.  Neurological:     General: No focal deficit present.     Mental Status: She is alert and oriented to person, place, and time.     Comments: Fatigued, sleepy but arousable to loud verbal stimulation. Moves all extremities equal bilateral general weakness.  Psychiatric:        Mood and Affect: Mood and affect normal. Mood is not anxious.        Behavior: Behavior is not agitated.     Comments: Fatigue appearance     ED Results / Procedures / Treatments   Labs (all labs ordered are listed, but only abnormal results are displayed) Labs Reviewed  RESP PANEL BY RT-PCR (FLU A&B, COVID) ARPGX2 - Abnormal; Notable for the following components:       Result Value   SARS Coronavirus 2 by RT PCR POSITIVE (*)    All other components within normal limits  BASIC METABOLIC PANEL - Abnormal; Notable for the following components:   CO2 15 (*)    Glucose, Bld 104 (*)    BUN 57 (*)    Creatinine, Ser 1.92 (*)    Calcium 10.5 (*)    GFR, Estimated 32 (*)    Anion gap 21 (*)    All other components within normal limits  CBC WITH DIFFERENTIAL/PLATELET - Abnormal; Notable for the following components:   RBC 5.26 (*)    Hemoglobin 9.5 (*)    HCT 34.1 (*)    MCV 64.8 (*)    MCH 18.1 (*)    MCHC 27.9 (*)    RDW 20.5 (*)    nRBC 6.4 (*)    Neutro Abs 9.1 (*)    Lymphs Abs 0.3 (*)    All other components within normal limits  TSH - Abnormal; Notable for the following components:   TSH 5.282 (*)    All other components within normal limits  BRAIN NATRIURETIC PEPTIDE - Abnormal; Notable for the following components:   B Natriuretic Peptide >4,500.0 (*)    All other components within normal limits  URINE CULTURE  CULTURE, BLOOD (ROUTINE X 2)  CULTURE, BLOOD (ROUTINE X 2)  URINALYSIS, ROUTINE W REFLEX MICROSCOPIC  PATHOLOGIST SMEAR REVIEW  LACTIC ACID, PLASMA  LACTIC ACID, PLASMA  HEPATIC FUNCTION PANEL  I-STAT VENOUS BLOOD GAS, ED    EKG EKG Interpretation  Date/Time:  Wednesday January 10 2021 11:56:28 EST Ventricular Rate:  99 PR Interval:    QRS Duration: 83 QT Interval:  302 QTC Calculation: 388 R Axis:   89 Text Interpretation: Sinus rhythm Probable left atrial enlargement Anterior infarct, old Confirmed by Elnora Morrison [phone]) on 01/10/2021 12:52:16 PM   Radiology DG Chest Portable 1 View  Result Date: 01/10/2021 CLINICAL DATA:  Fever and weakness.  Dizziness EXAM: PORTABLE CHEST 1 VIEW COMPARISON:  None. FINDINGS: Cardiopericardial enlargement, chronic. Poor visualization of the retrocardiac lung likely related to heart size, also seen previously. No edema, effusion, or pneumothorax. IMPRESSION: Chronic  Cardiopericardial enlargement.  No pulmonary edema. Electronically Signed   By: Monte Fantasia M.D.   On: 01/10/2021 11:43    Procedures .Critical Care Performed by: Elnora Morrison, MD Authorized by: Elnora Morrison, MD   Critical care provider statement:    Critical care time (minutes):  80   Critical care start time:  01/10/2021 10:00 AM   Critical care end time:  01/10/2021 11:20 PM   Critical care time was exclusive of:  Separately billable procedures and treating other patients and teaching time   Critical care was necessary to treat or prevent imminent or life-threatening deterioration of the following conditions:  Cardiac failure   Critical care was time spent personally by me on the following activities:  Evaluation of patient's response to treatment, examination of patient, ordering and performing treatments and interventions, ordering and review of laboratory studies, ordering and review of radiographic studies, pulse oximetry, re-evaluation of patient's condition and review of old charts   (including critical care time)  Medications Ordered in ED Medications  acetaminophen (TYLENOL) tablet 1,000 mg (1,000 mg Oral Given 01/10/21 1142)  adenosine (ADENOCARD) 6 MG/2ML injection 6 mg (6 mg Intravenous Given 01/10/21 1158)  adenosine (ADENOCARD) 6 MG/2ML injection (12 mg  Given 01/10/21 1158)  sodium chloride 0.9 % bolus 500 mL (0 mLs Intravenous Stopped 01/10/21 1433)  sodium chloride 0.9 % bolus 500 mL (500 mLs Intravenous New Bag/Given 01/10/21 1442)    ED Course  I have reviewed the triage vital signs and the nursing notes.  Pertinent labs & imaging results that were available during my care of the patient were reviewed by me and considered in my medical decision making (see chart for details).    MDM Rules/Calculators/A&P                          Patient presents with general weakness, malaise, fever with broad differential diagnosis including viral infection, bacterial  pneumonia, urine infection, COVID in addition to heart failure/kidney disease/metabolic.  Persistently tachycardia 173, history of SVT and EKG consistent with SVT.  Vagal maneuvers not successful.  Adenosine 6 followed by 12 mg successful to put patient back into sinus rhythm, repeat EKG reviewed showing sinus rhythm heart rate normal.  General blood work showed bicarb 15, acute renal failure creatinine 1.92, hemoglobin 9.5, normal white blood cell count.  Thyroid testing showed TSH 5.2 ordered due to persistent tachycardia.  Chest x-ray no acute infiltrate reviewed.  Urinalysis pending.  COVID test reviewed positive.  Patient does have history of heart failure, small IV fluid bolus given initially as patient clinically is dehydrated tachycardic very dry mucous membranes.  BUN significantly elevated.  Chest x-ray no signs of pulm edema, repeat 500 cc fluid bolus ordered.  Discussed with admission internal medicine team plan to add lactate, VBG, will hold antibiotics at this time and they will work to continue patient care and admitting the patient.  Holly Bradley was evaluated in Emergency Department on 01/10/2021 for the symptoms described in the history of present illness. She was evaluated in the context of the global COVID-19 pandemic, which necessitated consideration that the patient might be at risk for infection with the SARS-CoV-2 virus that causes COVID-19. Institutional protocols and algorithms that pertain to the evaluation of patients at risk for COVID-19 are in a state of rapid change based on information released by regulatory bodies including the CDC and federal and state organizations. These policies and algorithms were followed during the patient's care in the ED.   Final Clinical Impression(s) / ED Diagnoses Final diagnoses:  SVT (supraventricular tachycardia) (HCC)  Fever in adult  Bilateral leg edema  Pneumonia due to COVID-19 virus  Acute renal failure, unspecified acute renal  failure type (HCC)  Anemia, unspecified type    Rx / DC Orders ED Discharge Orders  None       Elnora Morrison, MD 01/10/21 [phone]

## 2021-01-10 NOTE — Progress Notes (Signed)
Kitchen called and house tray ordered

## 2021-01-10 NOTE — Plan of Care (Signed)
  Problem: Education: Goal: Ability to demonstrate management of disease process will improve Outcome: Not Progressing Goal: Ability to verbalize understanding of medication therapies will improve Outcome: Not Progressing Goal: Individualized Educational Video(s) Outcome: Not Progressing   Problem: Activity: Goal: Capacity to carry out activities will improve Outcome: Not Progressing   Problem: Cardiac: Goal: Ability to achieve and maintain adequate cardiopulmonary perfusion will improve Outcome: Not Progressing   Problem: Education: Goal: Knowledge of risk factors and measures for prevention of condition will improve Outcome: Not Progressing   Problem: Coping: Goal: Psychosocial and spiritual needs will be supported Outcome: Not Progressing

## 2021-01-10 NOTE — Progress Notes (Signed)
Patient pulled out both of her IV's. Attempted to restart IV but unsuccessful. IV team consult placed.

## 2021-01-10 NOTE — ED Triage Notes (Signed)
Pt from home with ems for weakness, dizziness and bilateral lower leg edema for the past few days. Pt HR 156 en route, CBG 149 rr24 . Pt a.o

## 2021-01-10 NOTE — ED Notes (Signed)
Pt chemically converted out of SVT after 12mg  adenosine. Seawell md at bedside

## 2021-01-10 NOTE — Progress Notes (Signed)
Dr. Johney Frame and teaching services are aware of elevated trop, lactic and BP (with Nitro titration). Will continue to trend labs and monitor BP. PRN hydralazine and beta blocker requested, waiting on further orders

## 2021-01-10 NOTE — Progress Notes (Deleted)
Dr. Johney Frame and teaching services are aware of elevated trop, lactic and BP (with Nitro titration). Will continue to trend labs and monitor BP. PRN hydralazine ordered

## 2021-01-10 NOTE — H&P (Signed)
Date: 01/10/2021               Patient Name:  Holly Bradley MRN: [id number]  DOB: 1976-09-21 Age / Sex: 45 y.o., female   PCP: Patient, No Pcp Per         Medical Service: Internal Medicine Teaching Service         Attending Physician: Dr. Dorian Pod     First Contact: Dr. Wynetta Emery Pager: [phone]  Second Contact: Dr. Sharon Seller Pager: [phone]       After Hours (After 5p/  First Contact Pager: [phone]  weekends / holidays): Second Contact Pager: [phone]   Chief Complaint: Weakness  History of Present Illness: Holly Bradley is a 45 year old female with past medical history significant for HTN, combined systolic and diastolic heart failure (echo 04/2020 - 40-45%, grade II diastolic dysfunction), moderate pericardial effusion, paroxysmal SVT, and uterine fibroids complicated by menometrorrhagia and chronic blood loss anemia who presented to [facility] on 01/10/21 for evaluation of weakness.  Patient states that for the past week she has had progressive generalized weakness, shortness of breath, palpitations and nausea. She reports no oral intake for the past three to four days, however she states that she is very thirsty and frequently requesting a soda. She states that her weakness, shortness of breath and palpitations progressed over the past several days to the point that she desired to call EMS for evaluation in the ED. Additionally, patient reports progressive bilateral lower extremity swelling for the past several weeks. She otherwise denies chest pain, cough, sore throat, fevers, chills.  Of note, patient was admitted to [facility] on 05/01/2020 with shortness of breath and palpitations found to have recurrent SVT, acute combined systolic and diastolic CHF, moderate pericardial effusion, HTN and severe anemia 2/2 bleeding uterine fibroids. Patient was started on carvedilol 12.5mg  twice daily, lasix 40mg  daily and lisinopril 40mg  daily with plan to follow-up closely with PCP, GYN for  uterine fibroids and EP for SVT ablation. Unfortunately, patient did not pursue hospital follow-up after discharge, no-showed multiple times to her appointments and has been nonadherent to prescribed medications since one month following discharge.   ED Course: On arrival to the ED, patient was febrile (101.0), tachycardic (173), hypertensive (160/135), tachypneic (31) and saturating well on room air. EKG revealed supraventricular tachycardia. Initial labs revealed patient to be COVID positive; BMP with creatinine 1.92, BUN 57, CO2 15, anion gap 21; CBC revealed Hb 9.5, MCV 64.8, WBC 10.2; BNP >4500; TSH 5.282. CXR revealed chronic cardiopericardial enlargement without pulmonary edema. Patient received 1g acetaminophen, two doses of 6mg  adenosine and two separate 500cc boluses of NS. Following administration of adenosine patient's repeat EKG revealed normal sinus rhythm with heart rate of 99. Patient was transferred to IMTS for further evaluation and management.  Meds: No current facility-administered medications on file prior to encounter.   Current Outpatient Medications on File Prior to Encounter  Medication Sig Dispense Refill   carvedilol (COREG) 6.25 MG tablet Take 2 tablets (12.5 mg total) by mouth 2 (two) times daily with a meal. 120 tablet 1   furosemide (LASIX) 40 MG tablet Take 1 tablet (40 mg total) by mouth daily. 30 tablet 01   lisinopril (ZESTRIL) 20 MG tablet Take 2 tablets (40 mg total) by mouth daily. 60 tablet 1   Allergies: Allergies as of 01/10/2021   (No Known Allergies)   Past Medical History:  Diagnosis Date   Chronic blood loss anemia    Related to uterine fibroids  Essential hypertension    Poorly controlled, repeat by report only on nicardipine 90 mg   Paroxysmal SVT (supraventricular tachycardia) (HCC)    Frequent episodes, can last anywhere from 20 minutes to 12-15 hours.  Not on beta-blocker or calcium channel blocker   Uterine fibroids affecting  pregnancy    Likely complicated by by menometrorrhagia and chronic anemia   Family History:  Patient reports that her mother has diabetes. She reports that her father is healthy and has no known medical conditions. She reports that her siblings are all healthy with no known medical conditions. She does not have children of her own.  Social History: Patient moved from [place] to [place] last year. She reports that she is currently living with family. She denies alcohol use, tobacco use or recreational drug use.  Review of Systems: A complete ROS was negative except as per HPI.  Physical Exam: Blood pressure (!) 213/132, pulse (!) 106, temperature 99.3 F (37.4 C), temperature source Oral, resp. rate (!) 22, height 5\' 6"  (1.676 m), weight 56.7 kg, SpO2 100 %. Physical Exam Vitals and nursing note reviewed.  Constitutional:      General: She is in acute distress.     Appearance: She is ill-appearing.  HENT:     Head: Normocephalic and atraumatic.     Mouth/Throat:     Mouth: Mucous membranes are dry.     Pharynx: Oropharynx is clear.     Comments: Dry crusted plaques overlying the lips Eyes:     General: Scleral icterus present.     Extraocular Movements: Extraocular movements intact.  Neck:     Comments: JVD to the ear Cardiovascular:     Rate and Rhythm: Regular rhythm. Tachycardia present.     Pulses: Normal pulses.     Heart sounds: Normal heart sounds.  Pulmonary:     Effort: Pulmonary effort is normal. No respiratory distress.     Breath sounds: Normal breath sounds.  Abdominal:     General: There is distension.     Palpations: There is mass.     Tenderness: There is no abdominal tenderness.     Comments: Large palpable mass extending from suprapubic region  Musculoskeletal:     Cervical back: Normal range of motion and neck supple.     Right lower leg: Edema present.     Left lower leg: Edema present.     Comments: 3+ pitting edema of bilateral lower extremities   Skin:    General: Skin is warm and dry.     Capillary Refill: Capillary refill takes more than 3 seconds.  Neurological:     General: No focal deficit present.     Mental Status: She is oriented to person, place, and time.    EKG 1: personally reviewed my interpretation is supraventricular tachycardia EKG 2: personally reviewed my interpretation is normal sinus rhythm  CXR: personally reviewed my interpretation is enlarged cardiac silhouette.  Assessment & Plan by Problem: Active Problems:   Acute exacerbation of CHF (congestive heart failure) ([place])  Holly Bradley is a 45 year old female with past medical history significant for HTN, heart failure (echo 04/2020 - 40-45%, grade II diastolic dysfunction), paroxysmal SVT, and uterine fibroids complicated by menometrorrhagia and chronic blood loss anemia who presented to [facility] on 01/10/21 for evaluation of weakness found to have SVT, acute on chronic heart failure, hypertensive emergency, AKI, COVID-19 infection, troponinemia, microcytic anemia and scleral icterus.  #Acute on chronic combined systolic and diastolic heart failure #History  of moderate pericardial effusion During prior hospitalization in May, patient noted to have EF of 40-45% with grade 2 diastolic dysfunction. She was started on carvedilol 12.5mg  twice daily with plans to follow-up with cardiology in the outpatient setting but lost to follow-up and nonadherent to prescribed medications. She presents today in SVT with symptoms, physical examination and laboratory evidence of acute on chronic heart failure. Patient has 3+ pitting edema to bilateral lower extremities with BNP elevated to >4500.  -Cardiology consulted, appreciate recommendations  -Furosemide 40mg  IV twice daily  -Stat echocardiogram  -Hydralazine 50mg  TID, up titrate as needed  -Nitro drip for afterload reduction, can transition to isordil once BP improves  -Metoprolol 25mg  TID for rate control, plan to  uptitrate pending TTE and improved volume status  -Strict intake/output  -Daily weights   #Paroxysmal SVT, chronic Patient presented to [facility] in SVT with return to normal sinus rhythm following administration of 6mg  adenosine x2. Shortly thereafter, patient returned to SVT which was initially unresponsive to 6mg  but returned to normal sinus rhythm following an additional 12mg  of adenosine. Patient was advised to follow-up with EP following her hospitalization in May, however she did not follow-up on three separate occassions.  -Cardiology consulted, appreciate recommendations  -Metoprolol 25mg  TID for rate control, plan to uptitrate pending TTE -Daily BMP -Daily Mg  #Hypertensive emergency, active Patient previously prescribed carvedilol 12.5mg  twice daily and lisinopril 40mg  daily, however she has been nonadherent to these medications since discharge from the hospital. Blood pressure significantly elevated to 161W-960A systolic on arrival, now trending up to >200. -Cardiology consulted, appreciate recommendations  -Nitroglycerin 0-135mcg drip  -Hydralazine 50mg  TID  -Metoprolol 25mg  TID  -Hydralazine 10mg  Q6H PRN  -Labetalol 10mg  Q6H PRN  #AKI, active Patient's creatinine elevated to 1.92 from 0.90 in May. Concerning for cardiorenal syndrome in the setting of her acute on chronic heart failure. -Furosemide 40mg  IV twice daily -Trend renal function on daily BMP -Follow-up urinalysis -Follow-up urine culture -Avoid nephrotoxins  #Troponinemia, active Troponin elevated to 802 -> 922. Likely demand ischemia in the setting of active problems as listed above. -Cardiology following, appreciate recommendations -Manage acute on chronic heart failure, hypertensive emergency and SVT per above  #COVID-19 infection, active Patient tested positive for COVID-19 on admission, however she is saturating at 100% on room air. -Continue to monitor for development of symptoms or hypoxia -Airborne and  contact precautions  #Lactic acidosis, active Patient noted to have bicarbonate of 15 with anion gap of 21 secondary to lactic acidosis (3.0). -Follow-up blood cultures -Follow-up lactic acid now then Q2H  #Microcytic anemia 2/2 menometrorrhagia 2/2 uterine fibroids, chronic Patient presenting with microcytic (MCV 64.8) anemia (Hb 9.5) which is improved from prior hospitalization. Patient has chronic anemia due to menometrorrhagia in the setting of large uterine fibroids. Patient has not received gynecological evaluation in past several years. Patient has palpable abdominal masses extending from suprapubic region concerning for enlarged fibroids. -CT Abdomen Pelvis WO Contrast -Ferritin -Iron and TIBC -Daily CBC -Follow-up pathologist smear  #Scleral icterus, active Patient noted to have scleral icterus on physical examination concerning for liver failure. Liver function tests not obtained. Patient would benefit from further workup of this finding. -Follow-up HFP -Follow-up ammonia  #VTE ppx: Heparin 5,000 units Q8H #Diet: Heart healthy #Code status: Full code #Bowel regimen: None  Dispo: Admit patient to Inpatient with expected length of stay greater than 2 midnights.  Signed: Cato Mulligan, MD 01/10/2021, 7:26 PM  Pager: [phone] After 5pm on weekdays and 1pm on  weekends: On Call pager: [phone]

## 2021-01-10 NOTE — Consult Note (Signed)
Cardiology Consultation:   Patient ID: Holly Bradley MRN: [id number]; DOB: 09-04-1976  Admit date: 01/10/2021 Date of Consult: 01/10/2021  Primary Care Provider: Patient, No Pcp Per [place] Cardiologist: Glenetta Hew, MD  [facility] Electrophysiologist:  Vickie Epley, MD    Patient Profile:   Holly Bradley is a 45 y.o. female with a hx of chronic systolic and diastolic heart failure. SVT, chronic anemia secondary to uterine fibroids, HTN who is being seen today for the evaluation of CHF and SVT at the request of Dr. Jimmye Norman.  History of Present Illness:   Holly Bradley was last seen by this service in May 2021. She previously received cardiology care in [place] and had a past diagnosis of SVT poorly controlled on nifedipine. She presented to [facility] with uncontrolled SVT and found to have mildly reduced EF of 40-45% and grade 2 DD. Adenosine broke her SVT in the ER. She was significantly anemic with Hb 4 requiring transfusion.  She was diuresed and discharged on coreg. CHF felt to be high output in the setting of severe anemia and SVT. Lasix continued for 1 week then PRN. Echo did show moderate pericardial effusion suspected to improve with diuresis. Plan for repeat echo was not completed. She was referred to EP for possible SVT ablation but she no-showed three times.   She presented to [facility] with weakness, dizziness, bilateral lower extremity swelling found to have SVT on arrival to ER. Vagal maneuvers unsuccessful and she required adenosine 6 mg --> 12 mg to convert to sinus rhythm.   AKI with sCr 1.92 CO2 15 Hb 9.5 --> 10.9 TSH 5.2.  BNP > 4500 COVID-19 positive CXR negative for edema but enlarged pericardial silhouette consistent with prior  Cardiology was consulted for suspected heart failure exacerbation and SVT.  Currently, the patient is grossly volume overloaded and states she has been gaining weight with worsening LE edema over the past several months. Has not  been taking any of her medications as she ran out. Notes worsening DOE, orthopnea. Has chest tightness when laying flat. Developed lightheadedness with SVT, but no syncope. Also notes some nausea with poor appetite.    Past Medical History:  Diagnosis Date  . Chronic blood loss anemia    Related to uterine fibroids  . Essential hypertension    Poorly controlled, repeat by report only on nicardipine 90 mg  . Paroxysmal SVT (supraventricular tachycardia) (HCC)    Frequent episodes, can last anywhere from 20 minutes to 12-15 hours.  Not on beta-blocker or calcium channel blocker  . Uterine fibroids affecting pregnancy    Likely complicated by by menometrorrhagia and chronic anemia    Past Surgical History:  Procedure Laterality Date  . Unknown       Home Medications:  Prior to Admission medications   Medication Sig Start Date End Date Taking? Authorizing Provider  carvedilol (COREG) 6.25 MG tablet Take 2 tablets (12.5 mg total) by mouth 2 (two) times daily with a meal. 05/03/20   Little Ishikawa, MD  furosemide (LASIX) 40 MG tablet Take 1 tablet (40 mg total) by mouth daily. 05/03/20 05/03/21  Little Ishikawa, MD  lisinopril (ZESTRIL) 20 MG tablet Take 2 tablets (40 mg total) by mouth daily. 05/03/20   Little Ishikawa, MD    Inpatient Medications: Scheduled Meds: . adenosine      . adenosine      . heparin  5,000 Units Subcutaneous Q8H   Continuous Infusions:  PRN Meds: acetaminophen  Allergies:  No Known Allergies  Social History:   Social History   Socioeconomic History  . Marital status: Single    Spouse name: Not on file  . Number of children: Not on file  . Years of education: Not on file  . Highest education level: Not on file  Occupational History  . Not on file  Tobacco Use  . Smoking status: Not on file  . Smokeless tobacco: Not on file  Substance and Sexual Activity  . Alcohol use: Not on file  . Drug use: Not on file  . Sexual activity:  Not on file  Other Topics Concern  . Not on file  Social History Narrative  . Not on file   Social Determinants of Health   Financial Resource Strain: Not on file  Food Insecurity: Not on file  Transportation Needs: Not on file  Physical Activity: Not on file  Stress: Not on file  Social Connections: Not on file  Intimate Partner Violence: Not on file    Family History:    Family History  Family history unknown: Yes     ROS:  Please see the history of present illness.   All other ROS reviewed and negative.     Physical Exam/Data:   Vitals:   01/10/21 1300 01/10/21 1315 01/10/21 1400 01/10/21 1500  BP: (!) 184/123 (!) 181/127 (!) 173/122 (!) 185/127  Pulse:  (!) 107 (!) 107 (!) 148  Resp: (!) 27 (!) 22 17 (!) 22  Temp:      TempSrc:      SpO2:  98% 93% 97%  Weight:      Height:       No intake or output data in the 24 hours ending 01/10/21 1543 Last 3 Weights 01/10/2021 05/03/2020 05/02/2020  Weight (lbs) 125 lb 160 lb 8 oz 161 lb  Weight (kg) 56.7 kg 72.802 kg 73.029 kg     Body mass index is 20.18 kg/m.  General:  Comfortable, NAD HEENT: normal Neck: JVD to the ear Vascular: No carotid bruits; FA pulses 2+ bilaterally without bruits  Cardiac:  Tachycardic, regular, no murmurs heard Lungs:  Crackles at bases. Otherwise clear Abd: Distended, tense, mildly tender to palpation Ext: 3+ pitting edema to the thighs Musculoskeletal:  No deformities, BUE and BLE strength normal and equal Skin: warm and dry  Neuro:  CNs 2-12 intact, no focal abnormalities noted Psych:  Normal affect   EKG:  The EKG was personally reviewed and demonstrates:  SVT HR 172, now sinus tachycardia HR 106, LVH, biatrial enlargement, old anterior infarct  Telemetry:  Telemetry was personally reviewed and demonstrates:  Sinus tachycardia  Relevant CV Studies:  1. Left ventricular ejection fraction, by estimation, is 40 to 45%. The  left ventricle has mildly decreased function. The left  ventricle  demonstrates global hypokinesis. There is mild concentric left ventricular  hypertrophy. Left ventricular diastolic  parameters are consistent with Grade II diastolic dysfunction  (pseudonormalization). Elevated left atrial pressure.  2. Right ventricular systolic function is low normal. The right  ventricular size is normal. There is moderately elevated pulmonary artery  systolic pressure.  3. Left atrial size was mildly dilated.  4. Right atrial size was mildly dilated.  5. Moderate pericardial effusion. The pericardial effusion is  circumferential. There is no evidence of cardiac tamponade.  6. The mitral valve is normal in structure. No evidence of mitral valve  regurgitation.  7. Tricuspid valve regurgitation is mild to moderate.  8. The aortic valve is  normal in structure. Aortic valve regurgitation is  not visualized.  9. The inferior vena cava is dilated in size with <50% respiratory  variability, suggesting right atrial pressure of 15 mmHg.  Laboratory Data:  High Sensitivity Troponin:  No results for input(s): TROPONINIHS in the last 720 hours.   Chemistry Recent Labs  Lab 01/10/21 1054  NA 143  K 4.9  CL 107  CO2 15*  GLUCOSE 104*  BUN 57*  CREATININE 1.92*  CALCIUM 10.5*  GFRNONAA 32*  ANIONGAP 21*    No results for input(s): PROT, ALBUMIN, AST, ALT, ALKPHOS, BILITOT in the last 168 hours. Hematology Recent Labs  Lab 01/10/21 1054  WBC 10.2  RBC 5.26*  HGB 9.5*  HCT 34.1*  MCV 64.8*  MCH 18.1*  MCHC 27.9*  RDW 20.5*  PLT 284   BNP Recent Labs  Lab 01/10/21 1054  BNP >4,500.0*    DDimer No results for input(s): DDIMER in the last 168 hours.   Radiology/Studies:  DG Chest Portable 1 View  Result Date: 01/10/2021 CLINICAL DATA:  Fever and weakness.  Dizziness EXAM: PORTABLE CHEST 1 VIEW COMPARISON:  None. FINDINGS: Cardiopericardial enlargement, chronic. Poor visualization of the retrocardiac lung likely related to heart  size, also seen previously. No edema, effusion, or pneumothorax. IMPRESSION: Chronic Cardiopericardial enlargement.  No pulmonary edema. Electronically Signed   By: Monte Fantasia M.D.   On: 01/10/2021 11:43     Assessment and Plan:   Acute on chronic systolic and diastolic heart failure NYHA class III symptoms. Acutely decompensated likely secondary to medication non-compliance, dietary non-compliance, SVT and COVID infection. Unfortunately lost to follow-up and appears to have been steadily gaining weight with worsening LE edema, DOE, orthopnea for the past several months. Last TTE 04/2020 with EF 40-45% with grade II diastolic dysfunction. BNP currently >4500. - Start lasix 40mg  IV BID--needs aggressive diuresis with goal -2L; will up-titrate as needed - Repeat TTE - Start hydralazine 50mg  TID and up-titrate as needed - Continue nitro gtt for afterload reduction--can transition to isordil once BP better - Low dose metop for rate control; will up-titrate pending TTE and improved volume status - Ultimately goal to be on ACE/ARB pending renal function - Monitor I/Os and daily standing weights - Will need extensive counseling about the need to follow-up as well as medication and dietary compliance  Hx of moderate pericardial effusion (04/2020) Noted during last admission on 04/2020. Repeat echo recommended but not performed. Cardiac silhouette very enlarged on CXR. Patient tachycardic but hypertensive with no signs of tamponade based exam. - Repeat TTE as above - Diuresis as above  PSVT Likely triggered by worsening volume overload and COVID-19 infection. S/p adenosine in the ED with return to sinus tachycardia. -Low dose metop for now for rate control -Will increase pending TTE as patient very decompensated currently  Hypertension Not compliant with home medications. -Start hydralazine as above -Continue nitro gtt -Goal to be on BB and ACE/ARB once clinically compensated with improved  renal function  AKI Likely due to renovascular congestion in the setting of acute HF exacerbation. -Diuresis as above -Renally dose medications -Avoid nephrotoxins  COVID-19 infection - per primary    New York Heart Association (NYHA) Functional Class NYHA Class III    For questions or updates, please contact [facility] Please consult [email or web address] for contact info under   Gwyndolyn Kaufman, MD

## 2021-01-10 NOTE — ED Notes (Signed)
Attempted to draw other labs along with blood cultures, unable to obtain blood will have 2nd RN to look

## 2021-01-11 ENCOUNTER — Encounter (HOSPITAL_COMMUNITY): Payer: Self-pay | Admitting: Internal Medicine

## 2021-01-11 ENCOUNTER — Inpatient Hospital Stay (HOSPITAL_COMMUNITY): Payer: BLUE CROSS/BLUE SHIELD

## 2021-01-11 DIAGNOSIS — I5082 Biventricular heart failure: Secondary | ICD-10-CM | POA: Diagnosis not present

## 2021-01-11 DIAGNOSIS — I361 Nonrheumatic tricuspid (valve) insufficiency: Secondary | ICD-10-CM | POA: Diagnosis not present

## 2021-01-11 DIAGNOSIS — I5043 Acute on chronic combined systolic (congestive) and diastolic (congestive) heart failure: Secondary | ICD-10-CM | POA: Diagnosis not present

## 2021-01-11 DIAGNOSIS — U071 COVID-19: Secondary | ICD-10-CM | POA: Diagnosis not present

## 2021-01-11 DIAGNOSIS — J1282 Pneumonia due to coronavirus disease 2019: Secondary | ICD-10-CM | POA: Diagnosis not present

## 2021-01-11 DIAGNOSIS — I161 Hypertensive emergency: Secondary | ICD-10-CM

## 2021-01-11 DIAGNOSIS — D509 Iron deficiency anemia, unspecified: Secondary | ICD-10-CM

## 2021-01-11 DIAGNOSIS — N179 Acute kidney failure, unspecified: Secondary | ICD-10-CM

## 2021-01-11 DIAGNOSIS — I340 Nonrheumatic mitral (valve) insufficiency: Secondary | ICD-10-CM

## 2021-01-11 DIAGNOSIS — I509 Heart failure, unspecified: Secondary | ICD-10-CM

## 2021-01-11 DIAGNOSIS — N920 Excessive and frequent menstruation with regular cycle: Secondary | ICD-10-CM | POA: Diagnosis not present

## 2021-01-11 DIAGNOSIS — D219 Benign neoplasm of connective and other soft tissue, unspecified: Secondary | ICD-10-CM | POA: Diagnosis present

## 2021-01-11 DIAGNOSIS — R222 Localized swelling, mass and lump, trunk: Secondary | ICD-10-CM

## 2021-01-11 DIAGNOSIS — Z7189 Other specified counseling: Secondary | ICD-10-CM

## 2021-01-11 DIAGNOSIS — I110 Hypertensive heart disease with heart failure: Secondary | ICD-10-CM

## 2021-01-11 LAB — CBC WITH DIFFERENTIAL/PLATELET
Abs Immature Granulocytes: 0.08 10*3/uL — ABNORMAL HIGH (ref 0.00–0.07)
Basophils Absolute: 0 10*3/uL (ref 0.0–0.1)
Basophils Relative: 0 %
Eosinophils Absolute: 0 10*3/uL (ref 0.0–0.5)
Eosinophils Relative: 0 %
HCT: 28.7 % — ABNORMAL LOW (ref 36.0–46.0)
Hemoglobin: 7.6 g/dL — ABNORMAL LOW (ref 12.0–15.0)
Immature Granulocytes: 1 %
Lymphocytes Relative: 5 %
Lymphs Abs: 0.4 10*3/uL — ABNORMAL LOW (ref 0.7–4.0)
MCH: 17.4 pg — ABNORMAL LOW (ref 26.0–34.0)
MCHC: 26.5 g/dL — ABNORMAL LOW (ref 30.0–36.0)
MCV: 65.8 fL — ABNORMAL LOW (ref 80.0–100.0)
Monocytes Absolute: 1.2 10*3/uL — ABNORMAL HIGH (ref 0.1–1.0)
Monocytes Relative: 15 %
Neutro Abs: 6.3 10*3/uL (ref 1.7–7.7)
Neutrophils Relative %: 79 %
Platelets: 207 10*3/uL (ref 150–400)
RBC: 4.36 MIL/uL (ref 3.87–5.11)
RDW: 19.8 % — ABNORMAL HIGH (ref 11.5–15.5)
WBC: 8 10*3/uL (ref 4.0–10.5)
nRBC: 9.9 % — ABNORMAL HIGH (ref 0.0–0.2)

## 2021-01-11 LAB — GLUCOSE, CAPILLARY
Glucose-Capillary: 115 mg/dL — ABNORMAL HIGH (ref 70–99)
Glucose-Capillary: 98 mg/dL (ref 70–99)

## 2021-01-11 LAB — BASIC METABOLIC PANEL
Anion gap: 14 (ref 5–15)
BUN: 61 mg/dL — ABNORMAL HIGH (ref 6–20)
CO2: 21 mmol/L — ABNORMAL LOW (ref 22–32)
Calcium: 9.5 mg/dL (ref 8.9–10.3)
Chloride: 109 mmol/L (ref 98–111)
Creatinine, Ser: 1.71 mg/dL — ABNORMAL HIGH (ref 0.44–1.00)
GFR, Estimated: 37 mL/min — ABNORMAL LOW (ref >60)
Glucose, Bld: 117 mg/dL — ABNORMAL HIGH (ref 70–99)
Potassium: 3.4 mmol/L — ABNORMAL LOW (ref 3.5–5.1)
Sodium: 144 mmol/L (ref 135–145)

## 2021-01-11 LAB — URINALYSIS, ROUTINE W REFLEX MICROSCOPIC
Bilirubin Urine: NEGATIVE
Glucose, UA: NEGATIVE mg/dL
Hgb urine dipstick: NEGATIVE
Ketones, ur: NEGATIVE mg/dL
Leukocytes,Ua: NEGATIVE
Nitrite: NEGATIVE
Protein, ur: NEGATIVE mg/dL
Specific Gravity, Urine: 1.005 (ref 1.005–1.030)
pH: 7 (ref 5.0–8.0)

## 2021-01-11 LAB — D-DIMER, QUANTITATIVE: D-Dimer, Quant: 2.4 ug/mL-FEU — ABNORMAL HIGH (ref 0.00–0.50)

## 2021-01-11 LAB — ECHOCARDIOGRAM LIMITED
Area-P 1/2: 5.38 cm2
Calc EF: 25 %
Height: 66 in
S' Lateral: 5 cm
Single Plane A2C EF: 22.6 %
Single Plane A4C EF: 28.6 %
Weight: 2273.6 oz

## 2021-01-11 LAB — RAPID URINE DRUG SCREEN, HOSP PERFORMED
Amphetamines: NOT DETECTED
Barbiturates: NOT DETECTED
Benzodiazepines: NOT DETECTED
Cocaine: NOT DETECTED
Opiates: NOT DETECTED
Tetrahydrocannabinol: POSITIVE — AB

## 2021-01-11 LAB — FERRITIN: Ferritin: 11 ng/mL (ref 11–307)

## 2021-01-11 LAB — PATHOLOGIST SMEAR REVIEW: Path Review: INCREASED

## 2021-01-11 LAB — C-REACTIVE PROTEIN: CRP: 3.3 mg/dL — ABNORMAL HIGH (ref <1.0)

## 2021-01-11 IMAGING — CT CT ABD-PELV W/O CM
2 of 4 series · 15 of 46 positions shown, 17 images · non-contrast
Comparison: None.

CLINICAL DATA: Abdominal distension.  COVID positive.

EXAM:
CT ABDOMEN AND PELVIS WITHOUT CONTRAST
TECHNIQUE: Multidetector CT imaging of the abdomen and pelvis was performed
following the standard protocol without IV contrast.

[Series 3: ap without · axial · non-contrast · 0.60mm/px · z∈[+799,+1189]mm · 12 of 90 slices shown, 14 images]
[im 6/90  soft-tissue]
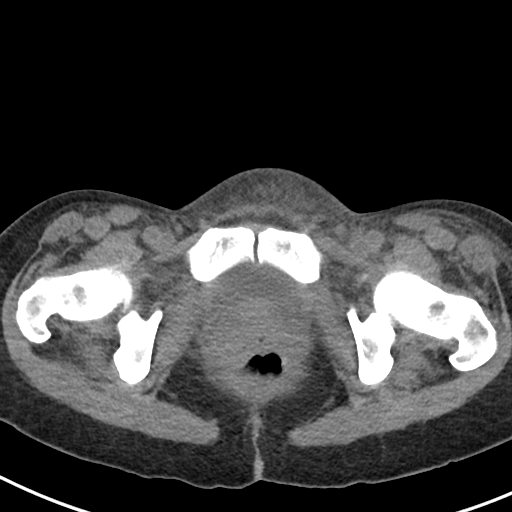
[im 6/90  bone]
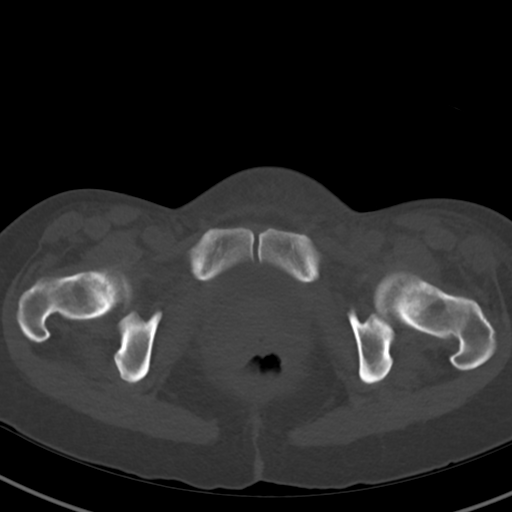
[im 16/90  soft-tissue]
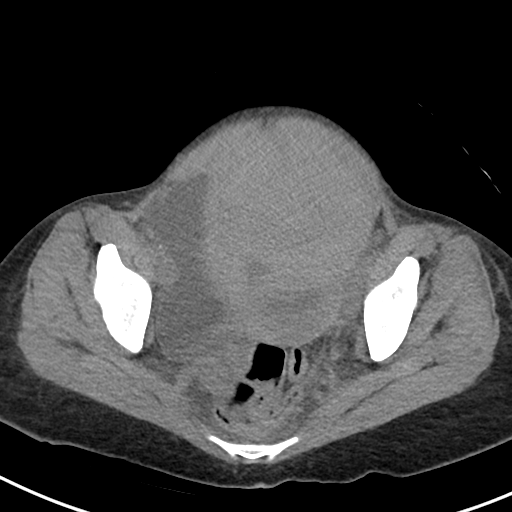
[im 21/90  soft-tissue]
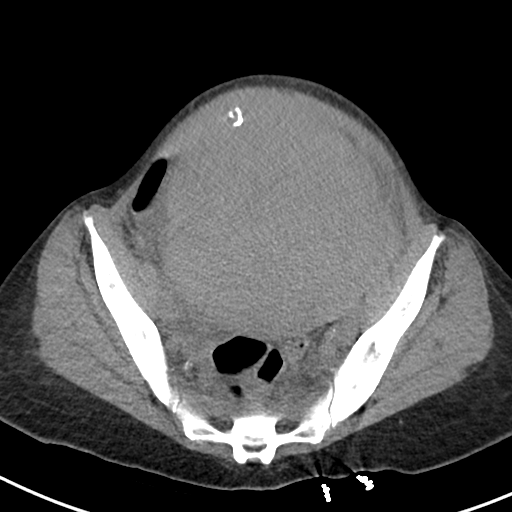
[im 27/90  soft-tissue]
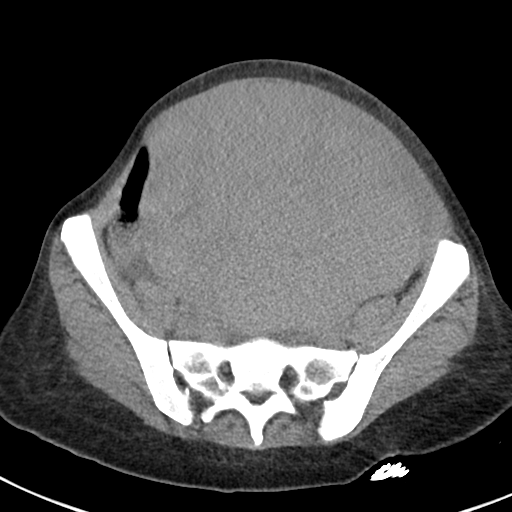
[im 37/90  soft-tissue]
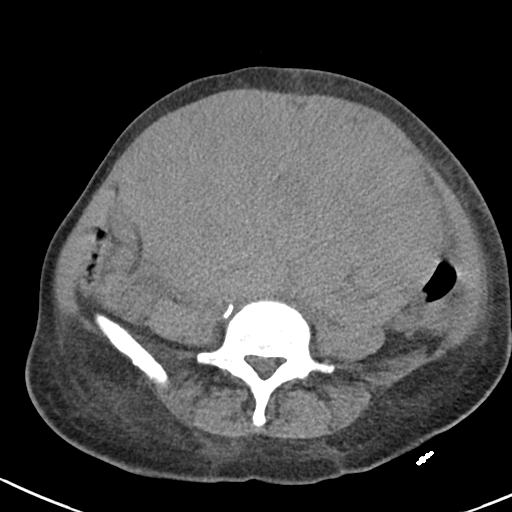
[im 42/90  soft-tissue]
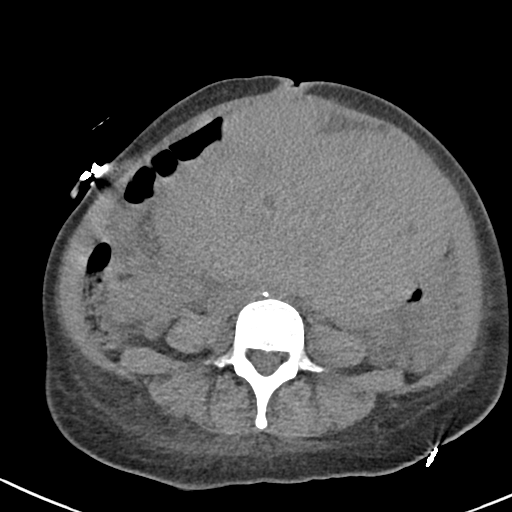
[im 48/90  soft-tissue]
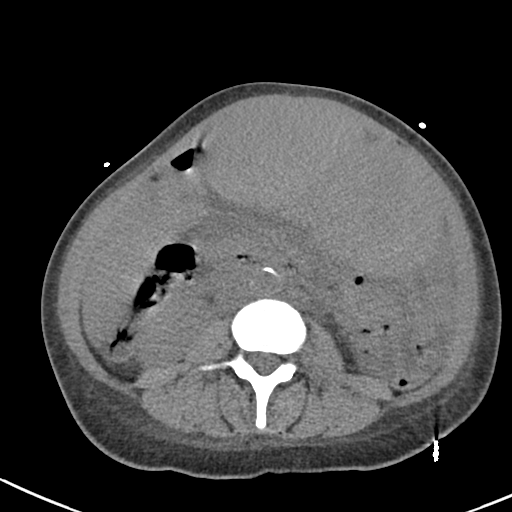
[im 58/90  soft-tissue]
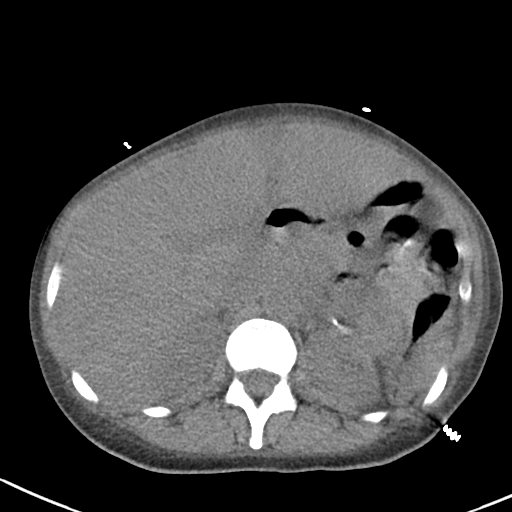
[im 63/90  soft-tissue]
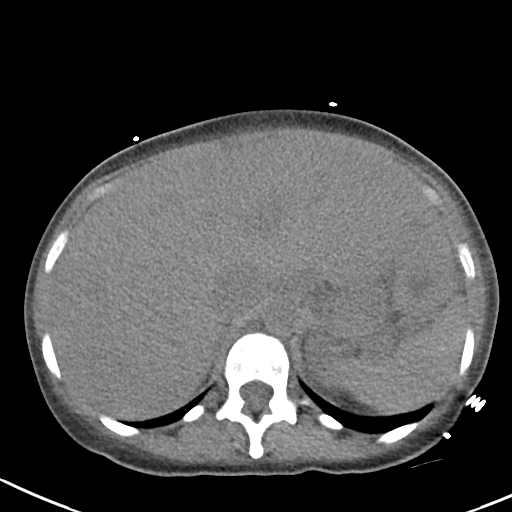
[im 63/90  bone]
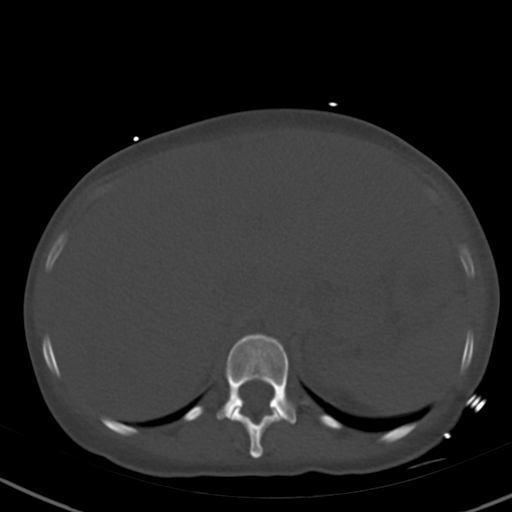
[im 69/90  soft-tissue]
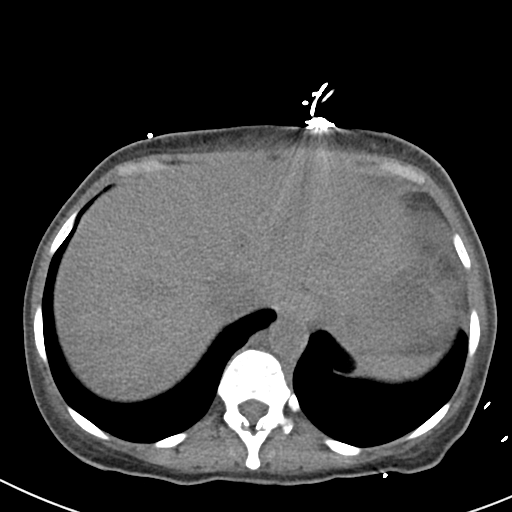
[im 79/90  soft-tissue]
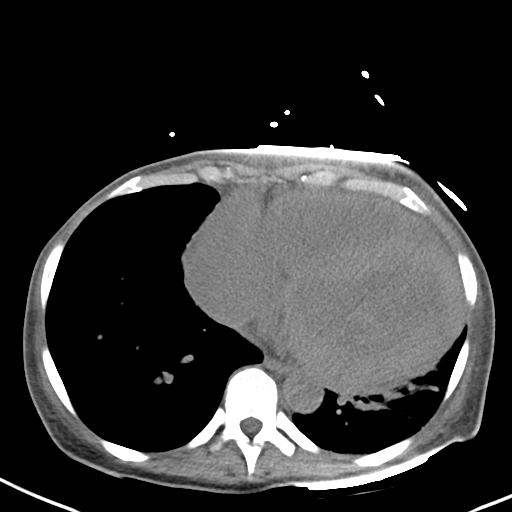
[im 84/90  soft-tissue]
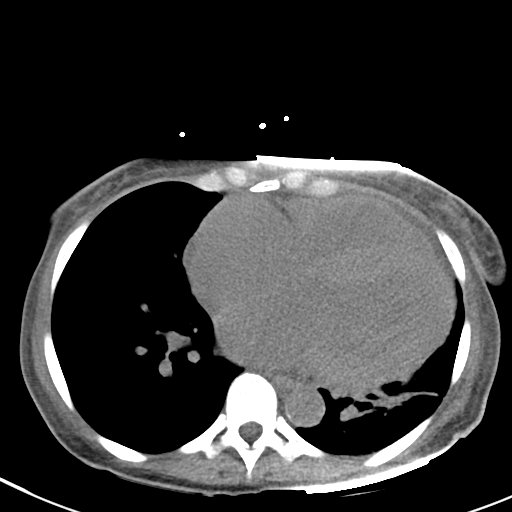

[Series 5: cor · coronal · 0.62mm/px · 3 of 89 slices shown]
[im 30/89  soft-tissue]
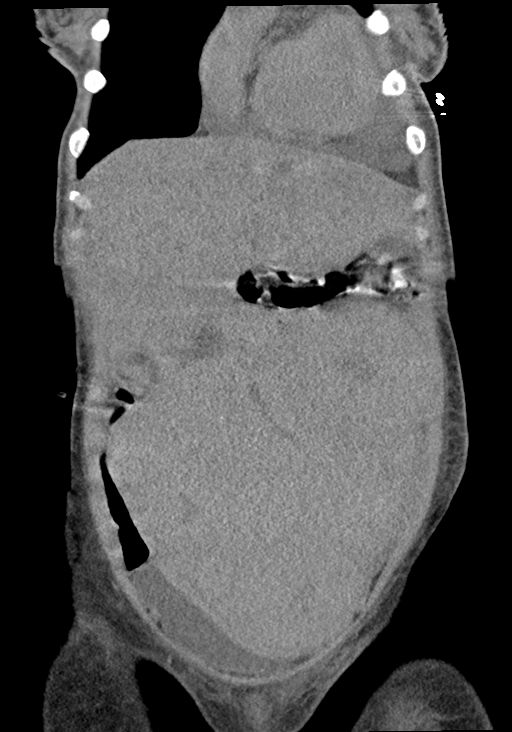
[im 40/89  soft-tissue]
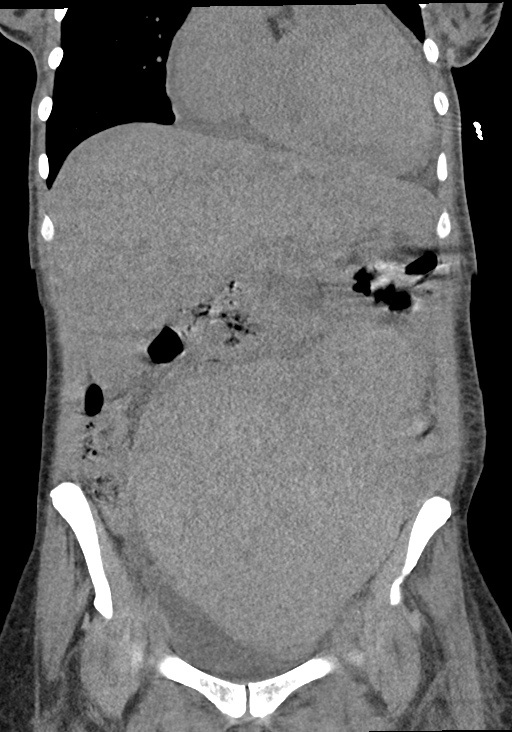
[im 49/89  soft-tissue]
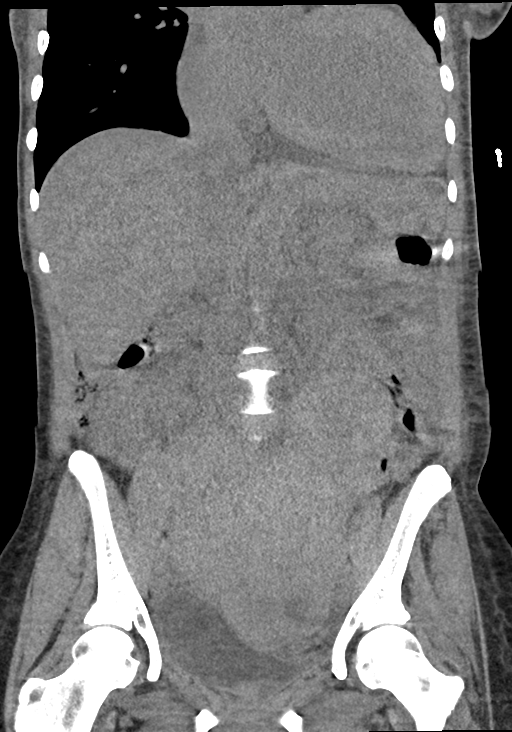

[15 of 46 positions shown; findings below may reference images not displayed]

FINDINGS: Lower chest: There is consolidation versus atelectasis at the left
lung base.There is massive cardiomegaly. There is a small to
moderate-sized pericardial effusion. The intracardiac blood pool is
hypodense relative to the adjacent myocardium consistent with
anemia.

Hepatobiliary: The liver appears enlarged but is not well evaluated
secondary to the lack of IV contrast and significant respiratory
motion artifact. The gallbladder was not well evaluated.The biliary
tree is not well evaluated.

Pancreas: The pancreas is not well evaluated.

Spleen: Unremarkable.

Adrenals/Urinary Tract:

--Adrenal glands: The adrenal glands are not well evaluated.

--Right kidney/ureter: There appears to be at least mild right-sided
hydronephrosis.

--Left kidney/ureter: There appears to be mild left-sided
hydronephrosis. The left kidney appears atrophic.

--Urinary bladder: The urinary bladder is displaced to the right.

Stomach/Bowel:

--Stomach/Duodenum: No hiatal hernia or other gastric abnormality.
Normal duodenal course and caliber.

--Small bowel: Unremarkable.

--Colon: The colon is suboptimally evaluated. There does not appear
to be colonic distension.

--Appendix: The appendix cannot be reliably identified.

Vascular/Lymphatic: Atherosclerotic calcification is present within
the non-aneurysmal abdominal aorta, without hemodynamically
significant stenosis.

--evaluation for adenopathy is limited.

Reproductive: There is a very large pelvic mass measuring
approximately 23 x 19 x 12 cm. Is difficult to determine the exact
origin of this mass, however it is favored to be uterine in origin.
Neither ovary is well identified on this study.

Other: There is a small volume of free fluid the patient's pelvis
and abdomen. The abdominal wall is normal.

Musculoskeletal. No acute displaced fractures.
IMPRESSION: 1. Exam is severely limited by extensive motion artifact and lack of
IV contrast.
2. There is a very large 23 cm mass arising from the patient's
pelvis. This is favored to be uterine in origin, however evaluation
is limited by lack of IV contrast. This may represent a large
fibroid uterus. Neither ovary is well identified. Gynecologic
consultation is recommended.
3. Small volume free fluid in the abdomen and pelvis.
4. Significant cardiomegaly with a small to moderate-sized
pericardial effusion.
5. Anemia.
6.  Aortic Atherosclerosis ([SJ]-[SJ]).

## 2021-01-11 MED ORDER — HYDRALAZINE HCL 50 MG PO TABS
75.0000 mg | ORAL_TABLET | Freq: Three times a day (TID) | ORAL | Status: DC
  Administered 2021-01-11 – 2021-01-12 (×3): 75 mg via ORAL
  Filled 2021-01-11 (×3): qty 1

## 2021-01-11 MED ORDER — SODIUM CHLORIDE 0.9 % IV SOLN
2.0000 g | Freq: Two times a day (BID) | INTRAVENOUS | Status: DC
  Administered 2021-01-11 – 2021-01-12 (×3): 2 g via INTRAVENOUS
  Filled 2021-01-11 (×3): qty 2

## 2021-01-11 NOTE — Progress Notes (Signed)
  Echocardiogram 2D Echocardiogram has been performed.  Holly Bradley 01/11/2021, 8:29 AM

## 2021-01-11 NOTE — Progress Notes (Signed)
Patient's overnight events, vitals, labs and imaging reviewed on rounds with Dr. Dorian Pod. Patient's CT Abdomen/Pelvis reveals a 23cm pelvic mass deeply concerning for the likelihood of compression of surrounding vasculature structures. As patient had significant lower extremity edema with dry mucous membranes and >3s capillary refill on physical examination in the setting of no interstitial infiltrates there is concern that patient's presentation may not be entirely explained by cardiac pathology, although we are currently awaiting results of echocardiogram. If patient is experiencing compression of the IVC, there would be significant impairment in venous return. Additionally, if there is a degree of compression of the descending aorta this would significantly increase afterload and may explain patient's narrow pulse pressure. These factors will need to be considered in the evaluation and management of this medically complex patient. IMTS to evaluate patient at bedside within the hour.

## 2021-01-11 NOTE — Consult Note (Addendum)
Gynecology Consult Note  Date of Consult: 01/11/2021   Requesting Provider: Internal Medicine Team  Primary OBGYN: None Primary Care Provider: None  Reason for Consult: large fibroid uterus  History of Present Illness: Holly Bradley is a 45 y.o. G0 (LMP: mid December 2021), with the above CC. PMHx is significant for tobacco abuse, HTN, known paroxysmal SVT, poor patient compliance, known fibroid uterus, heart failure.  Patient admitted after being brought in by EMS for weak and dizziness s/s and found to be in SVT. CT A/P by primary team for pelvic mass felt on exam; results are below  Patient moved to the area about six months ago; she was seen in [facility] by GYN and interventional radiology for her fibroids; see below  No VB since LMP and no hot flashes or night sweats. She is not sexually active.   ROS: A 12-point review of systems was performed and negative, except as stated in the above HPI.  OBGYN History: As per HPI. OB History  Gravida Para Term Preterm AB Living  0 0 0 0 0 0  SAB IAB Ectopic Multiple Live Births  0 0 0 0 0   Periods: qmonth, regular, lasts for about two weeks, heavy History of pap smears: unknown She is currently using nothing for contraception.    Past Medical History: Past Medical History:  Diagnosis Date  . Chronic blood loss anemia    Related to uterine fibroids  . Essential hypertension    Poorly controlled, repeat by report only on nicardipine 90 mg  . Paroxysmal SVT (supraventricular tachycardia) (HCC)    Frequent episodes, can last anywhere from 20 minutes to 12-15 hours.  Not on beta-blocker or calcium channel blocker  . Uterine fibroids affecting pregnancy    Likely complicated by by menometrorrhagia and chronic anemia    Past Surgical History: Past Surgical History:  Procedure Laterality Date  . Unknown      Family History:  Family History  Family history unknown: Yes    Social History:  Social History   Socioeconomic History   . Marital status: Single    Spouse name: Not on file  . Number of children: Not on file  . Years of education: Not on file  . Highest education level: Not on file  Occupational History  . Not on file  Tobacco Use  . Smoking status: Not on file  . Smokeless tobacco: Not on file  Substance and Sexual Activity  . Alcohol use: Not on file  . Drug use: Not on file  . Sexual activity: Not on file  Other Topics Concern  . Not on file  Social History Narrative  . Not on file   Social Determinants of Health   Financial Resource Strain: Not on file  Food Insecurity: Not on file  Transportation Needs: Not on file  Physical Activity: Not on file  Stress: Not on file  Social Connections: Not on file  Intimate Partner Violence: Not on file    Allergy: No Known Allergies  Current Outpatient Medications: Medications Prior to Admission  Medication Sig Dispense Refill Last Dose  . carvedilol (COREG) 6.25 MG tablet Take 2 tablets (12.5 mg total) by mouth 2 (two) times daily with a meal. 120 tablet 1   . furosemide (LASIX) 40 MG tablet Take 1 tablet (40 mg total) by mouth daily. 30 tablet 01   . lisinopril (ZESTRIL) 20 MG tablet Take 2 tablets (40 mg total) by mouth daily. 60 tablet 1  Hospital Medications: Current Facility-Administered Medications  Medication Dose Route Frequency Provider Last Rate Last Admin  . acetaminophen (TYLENOL) tablet 650 mg  650 mg Oral Q4H PRN Seawell, Jaimie A, DO      . ceFEPIme (MAXIPIME) 2 g in sodium chloride 0.9 % 100 mL IVPB  2 g Intravenous Q12H Bryk, Veronda P, RPH 200 mL/hr at 01/11/21 1011 2 g at 01/11/21 1011  . furosemide (LASIX) injection 40 mg  40 mg Intravenous BID Freada Bergeron, MD   40 mg at 01/11/21 1011  . heparin injection 5,000 Units  5,000 Units Subcutaneous Q8H Seawell, Jaimie A, DO   5,000 Units at 01/11/21 1347  . hydrALAZINE (APRESOLINE) injection 10 mg  10 mg Intravenous Q6H PRN Freada Bergeron, MD      .  hydrALAZINE (APRESOLINE) tablet 75 mg  75 mg Oral Q8H Masoudi, Elhamalsadat, MD   75 mg at 01/11/21 1347  . labetalol (NORMODYNE) injection 10 mg  10 mg Intravenous Q6H PRN Freada Bergeron, MD   10 mg at 01/10/21 1830  . metoprolol tartrate (LOPRESSOR) tablet 25 mg  25 mg Oral TID Freada Bergeron, MD   25 mg at 01/11/21 1011  . nitroGLYCERIN 50 mg in dextrose 5 % 250 mL (0.2 mg/mL) infusion  0-200 mcg/min Intravenous Titrated Seawell, Jaimie A, DO 21 mL/hr at 01/11/21 0554 70 mcg/min at 01/11/21 0554  . pantoprazole (PROTONIX) EC tablet 40 mg  40 mg Oral Daily Seawell, Jaimie A, DO   40 mg at 01/11/21 1011     Physical Exam:  Intake/Output Summary (Last 24 hours) at 01/11/2021 1403 Last data filed at 01/11/2021 1300 Gross per 24 hour  Intake 627.05 ml  Output 2901 ml  Net -2273.95 ml     Current Vital Signs 24h Vital Sign Ranges  T (!) 97.3 F (36.3 C) Temp  Avg: 97.8 F (36.6 C)  Min: 97 F (36.1 C)  Max: 99.3 F (37.4 C)  BP (!) 147/98 BP  Min: 143/116  Max: 213/132  HR 85 Pulse  Avg: 98.5  Min: 78  Max: 148  RR 16 Resp  Avg: 20.6  Min: 12  Max: 33  SaO2 100 % Room Air SpO2  Avg: 99.5 %  Min: 96 %  Max: 100 %       24 Hour I/O Current Shift I/O  Time Ins Outs 01/19 0701 - 01/20 0700 In: 267.1 [P.O.:120; I.V.:147.1] Out: 2200 [Urine:2200] 01/20 0701 - 01/20 1900 In: 360 [P.O.:360] Out: 701 [Urine:700]   Patient Vitals for the past 24 hrs:  BP Temp Temp src Pulse Resp SpO2 Height Weight  01/11/21 1347 (!) 147/98 - - - - - - -  01/11/21 1200 (!) 151/101 (!) 97.3 F (36.3 C) Oral - 16 100 % - -  01/11/21 0800 (!) 155/89 (!) 97 F (36.1 C) Oral 85 (!) 24 100 % - -  01/11/21 0600 (!) 158/120 - - - - 100 % - -  01/11/21 0542 - - - - - - - 64.5 kg  01/11/21 0530 (!) 182/112 - - - - 100 % - -  01/11/21 0500 (!) 154/91 98.1 F (36.7 C) Oral 78 13 100 % - -  01/11/21 0430 (!) 149/107 - - - 12 100 % - -  01/11/21 0400 (!) 152/90 - - - - 100 % - -  01/11/21 0330 (!)  156/91 - - 79 18 100 % - -  01/11/21 0300 (!) 149/113 (!) 97.5 F (  36.4 C) Oral 79 20 100 % - -  01/11/21 0230 (!) 163/110 - - 84 16 99 % - -  01/11/21 0200 (!) 157/100 - - 92 14 99 % - -  01/11/21 0130 (!) 147/108 - - - 13 96 % - -  01/11/21 0100 (!) 143/116 97.7 F (36.5 C) - - 20 100 % - -  01/10/21 2300 (!) 157/107 97.7 F (36.5 C) Axillary - (!) 22 100 % - -  01/10/21 2142 (!) 172/119 - - 96 18 - - -  01/10/21 2117 (!) 167/117 - - 95 20 - - -  01/10/21 2041 (!) 165/115 - - - 20 - - -  01/10/21 1958 (!) 171/107 (!) 97.5 F (36.4 C) - - 20 - - -  01/10/21 1815 (!) 213/132 - - - - - - -  01/10/21 1745 (!) 164/121 - - - - - - -  01/10/21 1706 (!) 164/115 99.3 F (37.4 C) Oral (!) 106 (!) 22 100 % 5\' 6"  (1.676 m) -  01/10/21 1639 (!) 171/112 - - (!) 104 (!) 21 99 % - -  01/10/21 1636 (!) 176/111 - - (!) 103 (!) 23 100 % - -  01/10/21 1633 (!) 168/122 - - (!) 104 (!) 25 99 % - -  01/10/21 1630 (!) 172/116 - - (!) 104 (!) 25 100 % - -  01/10/21 1627 (!) 185/117 - - (!) 103 (!) 29 100 % - -  01/10/21 1624 (!) 175/121 - - (!) 103 (!) 28 100 % - -  01/10/21 1621 (!) 170/115 - - (!) 106 19 100 % - -  01/10/21 1618 (!) 198/179 - - - (!) 23 - - -  01/10/21 1600 (!) 173/125 - - - (!) 33 - - -  01/10/21 1545 (!) 178/141 - - (!) 104 19 100 % - -  01/10/21 1500 (!) 185/127 - - (!) 148 (!) 22 97 % - -    Body mass index is 22.94 kg/m. General appearance: Well nourished, well developed female in no acute distress.  Respiratory:  Normal respiratory effort Abdomen: nttp. 22-24wk sized mass, firm Neuro/Psych:  Normal mood and affect.  Skin:  Warm and dry.  Extremities: no clubbing, cyanosis, or edema.   Laboratory: Recent Labs  Lab 01/10/21 1054 01/10/21 1641 01/11/21 0416  WBC 10.2  --  8.0  HGB 9.5* 10.9* 7.6*  HCT 34.1* 32.0* 28.7*  PLT 284  --  207   Recent Labs  Lab 01/10/21 1054 01/10/21 1600 01/10/21 1641 01/11/21 0416  NA 143  --  142 144  K 4.9  --  4.4 3.4*  CL  107  --   --  109  CO2 15*  --   --  21*  BUN 57*  --   --  61*  CREATININE 1.92*  --   --  1.71*  CALCIUM 10.5*  --   --  9.5  PROT  --  6.4*  --   --   BILITOT  --  2.8*  --   --   ALKPHOS  --  61  --   --   ALT  --  34  --   --   AST  --  52*  --   --   GLUCOSE 104*  --   --  117*    Imaging:  Narrative & Impression  CLINICAL DATA:  Abdominal distension.  COVID positive.  EXAM: CT ABDOMEN AND PELVIS WITHOUT  CONTRAST  TECHNIQUE: Multidetector CT imaging of the abdomen and pelvis was performed following the standard protocol without IV contrast.  COMPARISON:  None.  FINDINGS: Lower chest: There is consolidation versus atelectasis at the left lung base.There is massive cardiomegaly. There is a small to moderate-sized pericardial effusion. The intracardiac blood pool is hypodense relative to the adjacent myocardium consistent with anemia.  Hepatobiliary: The liver appears enlarged but is not well evaluated secondary to the lack of IV contrast and significant respiratory motion artifact. The gallbladder was not well evaluated.The biliary tree is not well evaluated.  Pancreas: The pancreas is not well evaluated.  Spleen: Unremarkable.  Adrenals/Urinary Tract:  --Adrenal glands: The adrenal glands are not well evaluated.  --Right kidney/ureter: There appears to be at least mild right-sided hydronephrosis.  --Left kidney/ureter: There appears to be mild left-sided hydronephrosis. The left kidney appears atrophic.  --Urinary bladder: The urinary bladder is displaced to the right.  Stomach/Bowel:  --Stomach/Duodenum: No hiatal hernia or other gastric abnormality. Normal duodenal course and caliber.  --Small bowel: Unremarkable.  --Colon: The colon is suboptimally evaluated. There does not appear to be colonic distension.  --Appendix: The appendix cannot be reliably identified.  Vascular/Lymphatic: Atherosclerotic calcification is present  within the non-aneurysmal abdominal aorta, without hemodynamically significant stenosis.  --evaluation for adenopathy is limited.  Reproductive: There is a very large pelvic mass measuring approximately 23 x 19 x 12 cm. Is difficult to determine the exact origin of this mass, however it is favored to be uterine in origin. Neither ovary is well identified on this study.  Other: There is a small volume of free fluid the patient's pelvis and abdomen. The abdominal wall is normal.  Musculoskeletal. No acute displaced fractures.  IMPRESSION: 1. Exam is severely limited by extensive motion artifact and lack of IV contrast. 2. There is a very large 23 cm mass arising from the patient's pelvis. This is favored to be uterine in origin, however evaluation is limited by lack of IV contrast. This may represent a large fibroid uterus. Neither ovary is well identified. Gynecologic consultation is recommended. 3. Small volume free fluid in the abdomen and pelvis. 4. Significant cardiomegaly with a small to moderate-sized pericardial effusion. 5. Anemia. 6.  Aortic Atherosclerosis (ICD10-I70.0).   Electronically Signed   By: Constance Holster M.D.   On: 01/11/2021 01:05    07/24/2018 *[id number]    [id number]    SCHIFFMAN, MARC H MD        CLINICAL HISTORY:  Symptomatic uterine fibroids.   TECHNIQUE:  MR of the pelvis with and without contrast  Contrast type: Gadavist  Contrast volume: 7 mL   COMPARISON:  None Available   FINDINGS:  Uterus: 23.7 x 12.5 x 21.6 cm (L x W x H) for a volume of 3351 cc.  The uterus extends to the level ofthe umbilicus. No widening of the  junctional zone. Probable vascular supply by the uterine arteries;  while no definite gonadal vascular supply is visualized, several  varicosities are present adjacent to the right adnexa and within the  pelvis.   Numerous leiomyomas are present (greater than 30), including   subserosal/exophytic, intramural, and submucosal/intracavitary  leiomyomas. Representative samples are as follows:  *Submucosal/intracavitary, homogeneously enhancing, 6.2 x 2.3 x 4.1  cm (series 5, image 18 and series 6, image 23)  *Submucosal, greater than 80% intracavitary, homogeneous enhancing,  2.9 x 1.4 x 1.8 cm (series 5, image 20 and series, image 19)  *Subserosal/exophytic, fundal, heterogeneously enhancing, 7.4 x 5.1  x  8.3 cm (series 5, image 20 and series 6, image 10)   There is compression of the right common iliac artery and vein by  enlarged uterus. Likely compression of the IVC and left common iliac  vein as well. There is compression of the urinary bladder. The  bowel loops are laterally displaced. Prominent pelvic vasculature  seen.   Endometrium: 16 cm in thickness. Expansion of the endometrial cavity  in the uterine body in due to intracavitary leiomyomas.   Cervix: Unremarkable.   Vagina: Unremarkable.   Right ovary: High positioning of the right ovary in the mid abdomen  (series 4, image 19). Few physiologic follicles, with dominant  follicle measuring up to 2.9 cm. No suspicious lesion.   Left ovary: Anterior positioning of the left ovary (series 4, image  9). Few physiologic follicles. No suspicious lesion.   Urinary bladder: Unremarkable.   Urethra: Unremarkable.   Visualized bowel: Unremarkable.   Lymph nodes: No lymphadenopathy.   Peritoneum: Focal fluid in the right pelvis. No nodules.   Retroperitoneum: Visualized structures are unremarkable.   Abdominal wall: No hernia.   Osseous structures/soft tissues: Unremarkable.   Other: Few punctate hepatic and renal cysts.   IMPRESSION:  Markedly enlarged uterus extending to the level of the umbilicus,  with numerous (greater than 30) fibroids, including some of which are  exophytic and intracavitary. Probable compression of the IVC,  bilateral common iliac veins, and right common  iliac artery by  enlarged uterus.   Prepared By: Murlean Hark MD                        Study interpreted and report approved by: Manfred Shirts MD         Electronically signed Diagnostic Report Imaging Report             07/24/2018 03:26 PM - 07/24/2018 05:18 PM                    Assessment: Holly Bradley is a 45 y.o. with large fibroid uterus; pt stable  Plan: Long d/w patient re: her fibroids. It sounds as if she may have been hesitant to proceed with intervention for her fibroids so she has never had any surgeries or procedures for them. In terms of medications, she may have had pills in the past but she has not had depot lupron or provera.     She states she has not seen a doctor since her may 2021 admission for SVT or taken any medicines and it she ?saw someone about a year ago in [place] for her heart issues but was not compliant with taking any medications.    I told her that I do not believe that the fibroids caused her current situation and her fibroids have not grown since then which is a good sign. I did tell her that it is certaintly plausible that the fibroids may be contributing to her current situation since she has not been controlling her heart issues since at least 2019; her BPs were in the severe range during her OBGYN and IR visits then.  I told her that we have to think about the fibroids in two ways: bleeding and bulk s/s.   She is not currently bleeding and has not bleeding since last month so maybe H/H drop due to her being hemoconcentrated on admission. I told her that I do recommend putting her on something to control any future  periods, which she is due for one, and to stay on medications until seen as an outpatient by GYN.  In terms of medications, options include depot lupron or provera or a PO progestin.  I d/w her re: the r/b of the various methods and will follow up with her tomorrow  In terms of  bulk, I told her that I do not recommend surgery right now. I spoke with her primary team and I told him that if they are thinking that removal of the fibroids would significantly impact her treatment course to consult GYN Oncology due to the complex nature of any hysterectomy on her. I also told her this and that even if she was healthy a hysterectomy would be a very high risk procedure, but I told her that she likely just needs to get optimized medically and this would help her kidney function, etc.   Total time taking care of the patient was 45 minutes, with greater than 50% of the time spent in face to face interaction with the patient.  Durene Romans MD [facility] GYN Consult Phone: [phone] (M-F, 0800-1700) & [phone] (Off hours, weekends, holidays)

## 2021-01-11 NOTE — Progress Notes (Signed)
RN in to administer Cefepime IV. Patient has dislodged Left FA IV. Only one IV remains. Nitroglycerin moved to existing IV. Nitroglycerin and Cefepime are not compatible. IV consult order implemented.

## 2021-01-11 NOTE — Progress Notes (Signed)
Pharmacy Antibiotic Note  Holly Bradley is a 45 y.o. female admitted on 01/10/2021 with multiple problems including HF and Covid, now w/ elevated PCT >> concern for bacterial pneumonia.  Pharmacy has been consulted for cefepime dosing.  Plan: Cefepime 2g IV Q12H.  Height: 5\' 6"  (167.6 cm) Weight: 56.7 kg (125 lb) IBW/kg (Calculated) : 59.3  Temp (24hrs), Avg:98.9 F (37.2 C), Min:97.5 F (36.4 C), Max:101 F (38.3 C)  Recent Labs  Lab 01/10/21 1054 01/10/21 1600 01/10/21 1927  WBC 10.2  --   --   CREATININE 1.92*  --   --   LATICACIDVEN  --  3.0* 3.0*    Estimated Creatinine Clearance: 32.8 mL/min (A) (by C-G formula based on SCr of 1.92 mg/dL (H)).    No Known Allergies   Thank you for allowing pharmacy to be a part of this patient's care.  Wynona Neat, PharmD, BCPS  01/11/2021 1:33 AM

## 2021-01-11 NOTE — Progress Notes (Signed)
Subjective:  Overnight, patient underwent CT Abdomen/Pelvis, continued on nitroglycerin drip at 51mcg/min and started on cefepime in the setting of fevers with elevated procalcitonin. Throughout the evening, patient had pulled out three peripheral IVs.  This morning, patient reports that she feels her shortness of breath is somewhat improved from admission. She does continue to endorse positional shortness of breath, however she is unable to elaborate further on specific positions that improve or worsen her symptoms. She states that she understands her condition and the plan of care. She has no further questions or concerns.  Objective:  Vital signs in last 24 hours: Vitals:   01/11/21 0600 01/11/21 0800 01/11/21 1200 01/11/21 1347  BP: (!) 158/120 (!) 155/89 (!) 151/101 (!) 147/98  Pulse:  85    Resp:  (!) 24 16   Temp:  (!) 97 F (36.1 C) (!) 97.3 F (36.3 C)   TempSrc:  Oral Oral   SpO2: 100% 100% 100%   Weight:      Height:      On room air  Intake/Output Summary (Last 24 hours) at 01/11/2021 1350 Last data filed at 01/11/2021 1300 Gross per 24 hour  Intake 627.05 ml  Output 2901 ml  Net -2273.95 ml   Filed Weights   01/10/21 1042 01/11/21 0542  Weight: 56.7 kg 64.5 kg   Physical Exam Constitutional:      General: She is not in acute distress.    Appearance: She is ill-appearing.  HENT:     Mouth/Throat:     Mouth: Mucous membranes are dry.     Pharynx: Oropharynx is clear.  Eyes:     Extraocular Movements: Extraocular movements intact.     Conjunctiva/sclera: Conjunctivae normal.  Neck:     Comments: JVD to the ear Cardiovascular:     Rate and Rhythm: Normal rate and regular rhythm.     Pulses: Normal pulses.     Heart sounds: Normal heart sounds.  Pulmonary:     Effort: Pulmonary effort is normal.     Breath sounds: Normal breath sounds.  Abdominal:     Tenderness: There is no abdominal tenderness.     Comments: Large palpable, round mass without  irregularity extending from suprapubic region to mid abdomen   Musculoskeletal:     Cervical back: Normal range of motion and neck supple.     Right lower leg: Edema present.     Left lower leg: Edema present.     Comments: 2+ pitting edema to the bilateral thighs  Skin:    General: Skin is warm and dry.  Neurological:     General: No focal deficit present.     Mental Status: She is alert and oriented to person, place, and time.  Psychiatric:        Attention and Perception: Attention normal.        Speech: Speech is tangential.    Labs in last 24 hours: CBC Latest Ref Rng & Units 01/11/2021 01/10/2021 01/10/2021  WBC 4.0 - 10.5 K/uL 8.0 - 10.2  Hemoglobin 12.0 - 15.0 g/dL 7.6(L) 10.9(L) 9.5(L)  Hematocrit 36.0 - 46.0 % 28.7(L) 32.0(L) 34.1(L)  Platelets 150 - 400 K/uL 207 - 284   BMP Latest Ref Rng & Units 01/11/2021 01/10/2021 01/10/2021  Glucose 70 - 99 mg/dL 117(H) - 104(H)  BUN 6 - 20 mg/dL 61(H) - 57(H)  Creatinine 0.44 - 1.00 mg/dL 1.71(H) - 1.92(H)  Sodium 135 - 145 mmol/L 144 142 143  Potassium 3.5 - 5.1  mmol/L 3.4(L) 4.4 4.9  Chloride 98 - 111 mmol/L 109 - 107  CO2 22 - 32 mmol/L 21(L) - 15(L)  Calcium 8.9 - 10.3 mg/dL 9.5 - 10.5(H)  Magnesium - 2.1 Ammonia - 33 Hepatic Function Panel: -Total Protein 6.4 -Albumin 3.1 -AST 52 -ALT 34 -Alkaline phosphatase 61 -Total bilirubin 2.8 -Direct bilirubin 0.7 -Indirect bilirubin 2.1 APTT - 25 PT/INR: PT 15.7, INR 1.3 Lactic acid - 3.0 from 3.0 Procalcitonin - 2.62 Urinalysis - unremarkable Urine culture - in process Blood cultures - in process Troponin - 888 from 922 from 802 Iron 18, TIBC 363, Saturation ratios 5%, UIBC 345, ferritin 12 CRP - 3.3 from 4.2 D-dimer - 2.40 from 3.06 Fibrinogen - 384 UDS - positive for THC Hemoglobin A1c - 5.6  Imaging in last 24 hours: CT ABDOMEN PELVIS WO CONTRAST  Result Date: 01/11/2021 IMPRESSION: 1. Exam is severely limited by extensive motion artifact and lack of IV  contrast. 2. There is a very large 23 cm mass arising from the patient's pelvis. This is favored to be uterine in origin, however evaluation is limited by lack of IV contrast. This may represent a large fibroid uterus. Neither ovary is well identified. Gynecologic consultation is recommended. 3. Small volume free fluid in the abdomen and pelvis. 4. Significant cardiomegaly with a small to moderate-sized pericardial effusion. 5. Anemia. 6.  Aortic Atherosclerosis (ICD10-I70.0). Electronically Signed   By: Constance Holster M.D.   On: 01/11/2021 01:05   ECHOCARDIOGRAM LIMITED  Result Date: 01/11/2021 ECHOCARDIOGRAM LIMITED REPORT  IMPRESSIONS  1. Left ventricular ejection fraction, by estimation, is 20 to 25%. The left ventricle has severely decreased function. The left ventricle demonstrates global hypokinesis. The left ventricular internal cavity size was mildly to moderately dilated. There  is moderate concentric left ventricular hypertrophy. Left ventricular diastolic parameters are consistent with Grade II diastolic dysfunction (pseudonormalization). Elevated left ventricular end-diastolic pressure. The average left ventricular global longitudinal strain is -5.8 %. The global longitudinal strain is abnormal.  2. Right ventricular systolic function is moderately reduced. The right ventricular size is normal. There is moderately elevated pulmonary artery systolic pressure.  3. Left atrial size was mildly dilated.  4. Right atrial size was mildly dilated.  5. Moderate pericardial effusion. The pericardial effusion is circumferential. There is no evidence of cardiac tamponade.  6. The mitral valve is normal in structure. Mild mitral valve regurgitation. No evidence of mitral stenosis.  7. The aortic valve is tricuspid. Aortic valve regurgitation is not visualized. No aortic stenosis is present.  8. The inferior vena cava is dilated in size with <50% respiratory variability, suggesting right atrial pressure of 15  mmHg. Comparison(s): Changes from prior study are noted. Conclusion(s)/Recommendation(s): LV function decreased since prior study. Moderate pericardial effusion still present but no evidence of tamponade. Findings communicated with Dr. Johney Frame at 9:13 AM.  Assessment/Plan:  Active Problems:   Acute exacerbation of CHF (congestive heart failure) ([place])   Pneumonia due to COVID-19 virus   Acute renal failure ([facility])   Anemia   Pelvic mass in female   Hypertensive emergency  Ms. 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  #Biventricular heart failure, active #Moderate pericardial effusion, chronic During prior hospitalization in May, patient noted to have EF of 40-45% with grade 2 diastolic dysfunction. Patient was lost to follow-up and nonadherent to prescribed medications following discharge. BNP on admission elevated to >4500 with echocardiogram this morning revealing LVEF of 20-25%, moderate RV dysfunction, moderate pulmonary arterial hypertension, moderate pericardial effusion and elevated right atrial pressure. Patient appears hypervolemic on physical examination, however she has clear lungs on examination and imaging. Patient would benefit from further diuresis. -Cardiology following, greatly appreciate recommendations  -Continue furosemide 40mg  IV twice daily  -Increase hydralazine from 50mg  TID to 75mg  TID  -Continue nitroglycerin drip with plan to transition to isordil once blood pressure controlled  -Continue  metoprolol 25mg  TID for rate control  -Upon resolution of AKI, consider addition of ACEi/ARB  -Strict intake/output  -Daily weights -Address factors contributing to nonadherence  #Severe hypertension, active Patient's blood pressures trending down from high of [id number] systolic to [id number] most recently. -Cardiology consulted, appreciate recommendations  -Continue nitroglycerin drip   -Increase hydralazine to 75mg  TID  -Continue metoprolol 25mg  TID  -Upon resolution of AKI, consider addition of ACEi/ARB  #AKI, active Patient's creatinine elevated to 1.92 from 0.90 in May. Concerning for cardiorenal syndrome in the setting of her acute on chronic heart failure. CT Abdomen/Pelvis did not reveal mild hydronephrosis and displacement of the urinary bladder which may be contributing to her AKI. Fortunately, patient continues to have appropriate urine output. -Furosemide 40mg  IV twice daily -Trend renal function on daily BMP -Follow-up urinalysis -Follow-up urine culture -Avoid nephrotoxins   #Paroxysmal SVT, chronic Patient presented in SVT with conversion to normal sinus rhythm following adenosine administration. Patient returned to SVT requiring an additional 6mg  and 12mg  of adenosine with return to normal sinus rhythm. No recurrence of SVT in past 12 hours. -Cardiology consulted, appreciate recommendations   -Continue metoprolol 25mg  TID for rate control with plan to transition to carvedilol following echocardiogram -Daily BMP -Daily Mg  #COVID-19 infection, active Patient tested positive for COVID-19 on admission, however she is saturating at 100% on room air. -Continue to monitor for development of symptoms or hypoxia -Airborne and contact precautions -Trend inflammatory markers (CRP, D-dimer, ferritin)  #Large uterine fibroids, chronic #Iron deficiency anemia, chronic Patient noted to have 23cm pelvic mass on CT Abdomen/Pelvis which is consistent with prior MRI in 2019. Mass is  significantly compressing intraabdominal structures given that it is filling a large portion of the peritoneal cavity. There is likely a component of compression of venous structures which will likely impair venous return. -OB/GYN consulted, appreciate recommendations -Daily CBC  #Fever Patient had fever on admission with lactic acidosis and significantly elevated procalcitonin. Patient's fever is likely secondary to her COVID-19 infection, however blood and urine cultures are pending to rule out associated superimposed bacterial infection. -Continue to follow blood and urine cultures -Continue cefepime for now  #VTE ppx: Heparin 5,000 units Q8H #Diet: Heart healthy #Code status: Full code #Bowel regimen: None  Cato Mulligan, MD 01/11/2021, 1:50 PM Pager: [phone] After 5pm on weekdays and 1pm on weekends: On Call pager [phone]

## 2021-01-11 NOTE — Progress Notes (Signed)
   01/11/21 0100  Assess: MEWS Score  Temp 97.7 F (36.5 C)  BP (!) 143/116  ECG Heart Rate (!) 140  Resp 20  Level of Consciousness Alert  SpO2 100 %  O2 Device Room Air  Assess: MEWS Score  MEWS Temp 0  MEWS Systolic 0  MEWS Pulse 3  MEWS RR 0  MEWS LOC 0  MEWS Score 3  MEWS Score Color Yellow  Assess: if the MEWS score is Yellow or Red  Were vital signs taken at a resting state? Yes  Focused Assessment No change from prior assessment  Early Detection of Sepsis Score *See Row Information* High  MEWS guidelines implemented *See Row Information* Yes  Treat  Pain Scale 0-10  Pain Score 0  Take Vital Signs  Increase Vital Sign Frequency  Yellow: Q 2hr X 2 then Q 4hr X 2, if remains yellow, continue Q 4hrs  Escalate  MEWS: Escalate Yellow: discuss with charge nurse/RN and consider discussing with provider and RRT  Notify: Charge Nurse/RN  Name of Charge Nurse/RN Notified Lyn, RN  Date Charge Nurse/RN Notified 01/11/21  Time Charge Nurse/RN Notified 0111  Notify: Provider  Provider Name/Title Revonda Humphrey, MD  Date Provider Notified 01/11/21  Time Provider Notified 0111  Notification Type Page  Notification Reason Change in status  Response See new orders  Date of Provider Response 01/11/21  Time of Provider Response 0115  Document  Patient Outcome Stabilized after interventions  Progress note created (see row info) Yes

## 2021-01-11 NOTE — Progress Notes (Addendum)
Progress Note  [name] Date of Encounter: 01/12/2021  [facility] Cardiologist: Glenetta Hew, MD   Subjective  In SVT this morning with HR 150s. HD stable with BP 140s. Mildly symptomatic. Pushed adenosine 6mg  followed by 12mg  with successful conversion to NSR.  Seen by OBGYN; deferring treatment of suspected fibroid uterus for now and continue with medical optimization as patient is very high risk for the procedure  Blood pressures 130-150s on nitro gtt K low at 3 Cr continuing to improve 1.56 Good UOP; net negative 1.67L with total UOP 2.7L Wt 142-->134 (? Accuracy)  Inpatient Medications    Scheduled Meds:  furosemide  40 mg Intravenous BID   heparin  5,000 Units Subcutaneous Q8H   hydrALAZINE  75 mg Oral Q8H   metoprolol tartrate  25 mg Oral TID   pantoprazole  40 mg Oral Daily   potassium chloride  40 mEq Oral BID   Continuous Infusions:  ceFEPime (MAXIPIME) IV 2 g (01/12/21 0049)   nitroGLYCERIN 30 mcg/min (01/12/21 0222)   PRN Meds: acetaminophen, hydrALAZINE, labetalol   Vital Signs    Vitals:   01/12/21 0300 01/12/21 0400 01/12/21 0511 01/12/21 0527  BP: (!) 153/85 (!) 145/91 (!) 156/99   Pulse:  85    Resp: 14 18 19    Temp:  (!) 97.5 F (36.4 C)    TempSrc:  Oral    SpO2: 99% 100%    Weight:    60.8 kg  Height:        Intake/Output Summary (Last 24 hours) at 01/12/2021 0752 Last data filed at 01/12/2021 0300 Gross per 24 hour  Intake 1032.1 ml  Output 2701 ml  Net -1668.9 ml   Last 3 Weights 01/12/2021 01/11/2021 01/10/2021  Weight (lbs) 134 lb 1.6 oz 142 lb 1.6 oz 125 lb  Weight (kg) 60.827 kg 64.456 kg 56.7 kg      Telemetry    SVT with HR 150s - Personally Reviewed  ECG    Sinus, LAE, septal q waves, PVC - Personally Reviewed  Physical Exam   GEN: Sitting up in bed, comfortable   Neck: JVD to the ear Cardiac: Tachycardic, regular, no murmurs Respiratory: Diminished at bases GI: Palpable firm  pelvic mass MS: 3+ edema to the thighs; warm Neuro:  Nonfocal  Psych: Normal affect   Labs    High Sensitivity Troponin:   Recent Labs  Lab 01/10/21 1449 01/10/21 1600 01/10/21 1927  TROPONINIHS 802* 922* 888*      Chemistry Recent Labs  Lab 01/10/21 1054 01/10/21 1600 01/10/21 1641 01/11/21 0416 01/12/21 0335  NA 143  --  142 144 145  K 4.9  --  4.4 3.4* 3.0*  CL 107  --   --  109 106  CO2 15*  --   --  21* 20*  GLUCOSE 104*  --   --  117* 85  BUN 57*  --   --  61* 53*  CREATININE 1.92*  --   --  1.71* 1.56*  CALCIUM 10.5*  --   --  9.5 9.3  PROT  --  6.4*  --   --   --   ALBUMIN  --  3.1*  --   --   --   AST  --  52*  --   --   --   ALT  --  34  --   --   --   ALKPHOS  --  61  --   --   --  BILITOT  --  2.8*  --   --   --   GFRNONAA 32*  --   --  37* 41*  ANIONGAP 21*  --   --  14 19*     Hematology Recent Labs  Lab 01/10/21 1054 01/10/21 1641 01/11/21 0416  WBC 10.2  --  8.0  RBC 5.26*  --  4.36  HGB 9.5* 10.9* 7.6*  HCT 34.1* 32.0* 28.7*  MCV 64.8*  --  65.8*  MCH 18.1*  --  17.4*  MCHC 27.9*  --  26.5*  RDW 20.5*  --  19.8*  PLT 284  --  207    BNP Recent Labs  Lab 01/10/21 1054  BNP >4,500.0*     DDimer  Recent Labs  Lab 01/10/21 2125 01/11/21 0416 01/12/21 0335  DDIMER 3.06* 2.40* 2.71*     Radiology    CT ABDOMEN PELVIS WO CONTRAST  Result Date: 01/11/2021 CLINICAL DATA:  Abdominal distension.  COVID positive. EXAM: CT ABDOMEN AND PELVIS WITHOUT CONTRAST TECHNIQUE: Multidetector CT imaging of the abdomen and pelvis was performed following the standard protocol without IV contrast. COMPARISON:  None. FINDINGS: Lower chest: There is consolidation versus atelectasis at the left lung base.There is massive cardiomegaly. There is a small to moderate-sized pericardial effusion. The intracardiac blood pool is hypodense relative to the adjacent myocardium consistent with anemia. Hepatobiliary: The liver appears enlarged but is not well  evaluated secondary to the lack of IV contrast and significant respiratory motion artifact. The gallbladder was not well evaluated.The biliary tree is not well evaluated. Pancreas: The pancreas is not well evaluated. Spleen: Unremarkable. Adrenals/Urinary Tract: --Adrenal glands: The adrenal glands are not well evaluated. --Right kidney/ureter: There appears to be at least mild right-sided hydronephrosis. --Left kidney/ureter: There appears to be mild left-sided hydronephrosis. The left kidney appears atrophic. --Urinary bladder: The urinary bladder is displaced to the right. Stomach/Bowel: --Stomach/Duodenum: No hiatal hernia or other gastric abnormality. Normal duodenal course and caliber. --Small bowel: Unremarkable. --Colon: The colon is suboptimally evaluated. There does not appear to be colonic distension. --Appendix: The appendix cannot be reliably identified. Vascular/Lymphatic: Atherosclerotic calcification is present within the non-aneurysmal abdominal aorta, without hemodynamically significant stenosis. --evaluation for adenopathy is limited. Reproductive: There is a very large pelvic mass measuring approximately 23 x 19 x 12 cm. Is difficult to determine the exact origin of this mass, however it is favored to be uterine in origin. Neither ovary is well identified on this study. Other: There is a small volume of free fluid the patient's pelvis and abdomen. The abdominal wall is normal. Musculoskeletal. No acute displaced fractures. IMPRESSION: 1. Exam is severely limited by extensive motion artifact and lack of IV contrast. 2. There is a very large 23 cm mass arising from the patient's pelvis. This is favored to be uterine in origin, however evaluation is limited by lack of IV contrast. This may represent a large fibroid uterus. Neither ovary is well identified. Gynecologic consultation is recommended. 3. Small volume free fluid in the abdomen and pelvis. 4. Significant cardiomegaly with a small to  moderate-sized pericardial effusion. 5. Anemia. 6.  Aortic Atherosclerosis (ICD10-I70.0). Electronically Signed   By: Constance Holster M.D.   On: 01/11/2021 01:05   DG Chest Portable 1 View  Result Date: 01/10/2021 CLINICAL DATA:  Fever and weakness.  Dizziness EXAM: PORTABLE CHEST 1 VIEW COMPARISON:  None. FINDINGS: Cardiopericardial enlargement, chronic. Poor visualization of the retrocardiac lung likely related to heart size, also seen previously. No edema,  effusion, or pneumothorax. IMPRESSION: Chronic Cardiopericardial enlargement.  No pulmonary edema. Electronically Signed   By: Marnee Spring M.D.   On: 01/10/2021 11:43   ECHOCARDIOGRAM LIMITED  Result Date: 01/11/2021    ECHOCARDIOGRAM LIMITED REPORT   Patient Name:   LAYLA LANTERMAN Date of Exam: 01/11/2021 Medical Rec #:  [id number]        Height:       66.0 in Accession #:    [id number]       Weight:       142.1 lb Date of Birth:  August 07, 1976        BSA:          1.729 m Patient Age:    44 years         BP:           158/120 mmHg Patient Gender: F                HR:           86 bpm. Exam Location:  Inpatient Procedure: 2D Echo, Cardiac Doppler, Color Doppler and Strain Analysis STAT ECHO Indications:    Congestive Heart Failure I50.9  History:        Patient has prior history of Echocardiogram examinations, most                 recent 05/02/2020. Risk Factors:Hypertension.  Sonographer:    Renella Cunas RDCS Referring Phys: [id number] JOSHUA ZAVITZ  Sonographer Comments: Covid positive. IMPRESSIONS  1. Left ventricular ejection fraction, by estimation, is 20 to 25%. The left ventricle has severely decreased function. The left ventricle demonstrates global hypokinesis. The left ventricular internal cavity size was mildly to moderately dilated. There  is moderate concentric left ventricular hypertrophy. Left ventricular diastolic parameters are consistent with Grade II diastolic dysfunction (pseudonormalization). Elevated left ventricular end-diastolic  pressure. The average left ventricular global longitudinal strain is -5.8 %. The global longitudinal strain is abnormal.  2. Right ventricular systolic function is moderately reduced. The right ventricular size is normal. There is moderately elevated pulmonary artery systolic pressure.  3. Left atrial size was mildly dilated.  4. Right atrial size was mildly dilated.  5. Moderate pericardial effusion. The pericardial effusion is circumferential. There is no evidence of cardiac tamponade.  6. The mitral valve is normal in structure. Mild mitral valve regurgitation. No evidence of mitral stenosis.  7. The aortic valve is tricuspid. Aortic valve regurgitation is not visualized. No aortic stenosis is present.  8. The inferior vena cava is dilated in size with <50% respiratory variability, suggesting right atrial pressure of 15 mmHg. Comparison(s): Changes from prior study are noted. Conclusion(s)/Recommendation(s): LV function decreased since prior study. Moderate pericardial effusion still present but no evidence of tamponade. Findings communicated with Dr. Shari Prows at 9:13 AM. FINDINGS  Left Ventricle: Left ventricular ejection fraction, by estimation, is 20 to 25%. The left ventricle has severely decreased function. The left ventricle demonstrates global hypokinesis. The average left ventricular global longitudinal strain is -5.8 %. The global longitudinal strain is abnormal. The left ventricular internal cavity size was mildly to moderately dilated. There is moderate concentric left ventricular hypertrophy. Left ventricular diastolic parameters are consistent with Grade II diastolic dysfunction (pseudonormalization). Elevated left ventricular end-diastolic pressure. Right Ventricle: The right ventricular size is normal. No increase in right ventricular wall thickness. Right ventricular systolic function is moderately reduced. There is moderately elevated pulmonary artery systolic pressure. The tricuspid regurgitant  velocity is 2.78 m/s, and with an assumed right  atrial pressure of 15 mmHg, the estimated right ventricular systolic pressure is 123XX123 mmHg. Left Atrium: Left atrial size was mildly dilated. Right Atrium: Right atrial size was mildly dilated. Pericardium: A moderately sized pericardial effusion is present. The pericardial effusion is circumferential. There is no evidence of cardiac tamponade. Mitral Valve: The mitral valve is normal in structure. Mild mitral valve regurgitation. No evidence of mitral valve stenosis. Tricuspid Valve: The tricuspid valve is normal in structure. Tricuspid valve regurgitation is mild . No evidence of tricuspid stenosis. Aortic Valve: The aortic valve is tricuspid. Aortic valve regurgitation is not visualized. No aortic stenosis is present. Pulmonic Valve: The pulmonic valve was grossly normal. Pulmonic valve regurgitation is mild. No evidence of pulmonic stenosis. Aorta: The aortic root is normal in size and structure. Venous: The inferior vena cava is dilated in size with less than 50% respiratory variability, suggesting right atrial pressure of 15 mmHg. IAS/Shunts: The atrial septum is grossly normal. LEFT VENTRICLE PLAX 2D LVIDd:         5.50 cm      Diastology LVIDs:         5.00 cm      LV e' medial:    3.04 cm/s LV PW:         1.50 cm      LV E/e' medial:  35.9 LV IVS:        1.50 cm      LV e' lateral:   5.96 cm/s LVOT diam:     1.90 cm      LV E/e' lateral: 18.3 LV SV:         47 LV SV Index:   27           2D Longitudinal Strain LVOT Area:     2.84 cm     2D Strain GLS (A2C):   -6.3 %                             2D Strain GLS (A3C):   -5.7 %                             2D Strain GLS (A4C):   -5.6 % LV Volumes (MOD)            2D Strain GLS Avg:     -5.8 % LV vol d, MOD A2C: 261.0 ml LV vol d, MOD A4C: 227.0 ml LV vol s, MOD A2C: 202.0 ml LV vol s, MOD A4C: 162.0 ml LV SV MOD A2C:     59.0 ml LV SV MOD A4C:     227.0 ml LV SV MOD BP:      60.8 ml RIGHT VENTRICLE RV S prime:      7.20 cm/s TAPSE (M-mode): 1.1 cm LEFT ATRIUM         Index LA diam:    4.50 cm 2.60 cm/m  AORTIC VALVE LVOT Vmax:   113.00 cm/s LVOT Vmean:  72.300 cm/s LVOT VTI:    0.165 m  AORTA Ao Root diam: 3.10 cm MITRAL VALVE                TRICUSPID VALVE MV Area (PHT): 5.38 cm     TR Peak grad:   30.9 mmHg MV Decel Time: 141 msec     TR Vmax:        278.00 cm/s MV E velocity: 109.00 cm/s  MV A velocity: 55.30 cm/s   SHUNTS MV E/A ratio:  1.97         Systemic VTI:  0.16 m                             Systemic Diam: 1.90 cm Buford Dresser MD Electronically signed by Buford Dresser MD Signature Date/Time: 01/11/2021/9:13:52 AM    Final     Cardiac Studies   TTE 01/11/21: 1. Left ventricular ejection fraction, by estimation, is 20 to 25%. The  left ventricle has severely decreased function. The left ventricle  demonstrates global hypokinesis. The left ventricular internal cavity size  was mildly to moderately dilated. There  is moderate concentric left ventricular hypertrophy. Left ventricular  diastolic parameters are consistent with Grade II diastolic dysfunction  (pseudonormalization). Elevated left ventricular end-diastolic pressure.  The average left ventricular global  longitudinal strain is -5.8 %. The global longitudinal strain is abnormal.  2. Right ventricular systolic function is moderately reduced. The right  ventricular size is normal. There is moderately elevated pulmonary artery  systolic pressure.  3. Left atrial size was mildly dilated.  4. Right atrial size was mildly dilated.  5. Moderate pericardial effusion. The pericardial effusion is  circumferential. There is no evidence of cardiac tamponade.  6. The mitral valve is normal in structure. Mild mitral valve  regurgitation. No evidence of mitral stenosis.  7. The aortic valve is tricuspid. Aortic valve regurgitation is not  visualized. No aortic stenosis is present.  8. The inferior vena cava is dilated in  size with <50% respiratory  variability, suggesting right atrial pressure of 15 mmHg.   Comparison(s): Changes from prior study are noted.   TTE 04/2020: 1. Left ventricular ejection fraction, by estimation, is 40 to 45%. The  left ventricle has mildly decreased function. The left ventricle  demonstrates global hypokinesis. There is mild concentric left ventricular  hypertrophy. Left ventricular diastolic  parameters are consistent with Grade II diastolic dysfunction  (pseudonormalization). Elevated left atrial pressure.  2. Right ventricular systolic function is low normal. The right  ventricular size is normal. There is moderately elevated pulmonary artery  systolic pressure.  3. Left atrial size was mildly dilated.  4. Right atrial size was mildly dilated.  5. Moderate pericardial effusion. The pericardial effusion is  circumferential. There is no evidence of cardiac tamponade.  6. The mitral valve is normal in structure. No evidence of mitral valve  regurgitation.  7. Tricuspid valve regurgitation is mild to moderate.  8. The aortic valve is normal in structure. Aortic valve regurgitation is  not visualized.  9. The inferior vena cava is dilated in size with <50% respiratory  variability, suggesting right atrial pressure of 15 mmHg.  Patient Profile     45 y.o. 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 Course complicated by newly discovered pelvic mass measuring approximately 23 x 19 x 12 cm with evidence of bilateral mild hydronephrosis. Cardiology is consulted for management of SVT, acute on chronic systolic heart failure and elevated troponin.  Assessment & Plan    Acute on  chronic combined systolic and diastolic heart failure NYHA class III symptoms. Acutely decompensated  likely secondary to medication non-compliance, dietary non-compliance, SVT and COVID infection. Unfortunately lost to follow-up and appears to have been steadily gaining weight with worsening LE edema, DOE, orthopnea for the past several months.TTE 04/2020 with EF 40-45% with grade II diastolic dysfunction-->now LVEF 20-25%. BNP currently >4500. Certainly the pelvic mass may be contributing to LE edema if there is significant venous compression, however, fortunately, there is no evidence of aortic compression. Further, this would not explain a significantly elevated BNP and thus, likely patient has underlying acute HF exacerbation in addition to compression. - TTE with LVEF ~20%, moderate RV dysfunction, moderate PAH, moderate effusion, RAP >55mmHg - Continue lasix 40mg  IV BID--tolerating well with good output - Up-titrate hydralazine to 100mg  TID  - Continue nitro gtt for afterload reduction--likely plan to transition to entresto tomorrow vs bidil pending continued improvement in renal function - Increase metop to 37.5mg  TID due to recurrent SVT this AM - Ultimately goal to be on BB, entresto, spiro, and SGLT-2 inhibitor--if compliance is an issue, will have to proceed with bidil over entresto - Will need ischemic work-up (likely RHC/LHC) once more euvolemic - Monitor I/Os and daily standing weights - Will need extensive counseling about the need to follow-up as well as medication and dietary compliance  Hx of moderate pericardial effusion (04/2020) Noted during last admission on 04/2020. Lost to follow-up. CT on 01/11/21 showed small-to-moderate effusion. TTE on 01/11/21 with stable moderate effusion; no tamponade. - Remains moderate on TTE today - Diuresis as above - If pelvic mass malignant, will need to sample pericardial fluid  PSVT Likely triggered by worsening volume overload and COVID-19  infection. S/p adenosine in the ED with initial return to NSR. Developed recurrent SVT this morning 01/12/21 s/p adenosine 6mg  then 12mg  with conversion back to sinus. -S/p adenosine again this AM with conversion back to NSR -Up-titrate metop as above  Severe, uncontrolled HTN Not compliant with home medications. G2DD on last TTE. On nitro gtt. -Increase hydralazine to 100mg  TID as above -Continue nitro gtt--hope to transition to [employer] tomorrow pending continued improvement in renal function - Ultimately goal to be on BB, entresto, spiro, and SGLT-2 inhibitor--if compliance is an issue, will have to proceed with bidil over entresto  AKI Likely multifactorial in the setting of renovascular congestion as well as compression from large pelvic mass. Improving with diuresis. -Diuresis as above -Renally dose medications -Avoid nephrotoxins  COVID-19 infection - per primary  Pelvic Mass Large pelvic mass noted on CT measuring 23 x 19 x 12 cm. ? Fibroid uterus vs malignant origin. -OBGYN recommends conservative treatment at this time -Management per primary team  Total time of encounter: 60 minutes total time of encounter, including 45 minutes spent in face-to-face patient care on the date of this encounter. This time includes coordination of care and counseling regarding above mentioned problem list. Remainder of non-face-to-face time involved reviewing chart documents/testing relevant to the patient encounter and documentation in the medical record. I have independently reviewed documentation from referring provider.   Gwyndolyn Kaufman, MD [place]     For questions or updates, please contact [place] Please consult [email or web address] for contact info under        Signed, Freada Bergeron, MD  01/12/2021, 7:52 AM

## 2021-01-11 NOTE — Progress Notes (Addendum)
Progress Note  [name] Date of Encounter: 01/11/2021  [facility] Cardiologist: Glenetta Hew, MD   Subjective   Feeling better this morning. States breathing is still labored. No chest pain or palpitations. Remains in sinus.   TTE with LVEF 20-25%, global HK, moderate RV systolic dysfunction, moderate PAH, moderate pericardial effusion, RAP >7mmHg Blood pressures better controlled on nitro gtt at 70 CT with large pelvic mass measuring 23cm Cr improved from 1.9-->1.7 Net negative 1.9L with 2.2L UOP Wt unreliable  Inpatient Medications    Scheduled Meds: . furosemide  40 mg Intravenous BID  . heparin  5,000 Units Subcutaneous Q8H  . hydrALAZINE  50 mg Oral Q8H  . metoprolol tartrate  25 mg Oral TID  . pantoprazole  40 mg Oral Daily   Continuous Infusions: . ceFEPime (MAXIPIME) IV 2 g (01/11/21 0344)  . nitroGLYCERIN 70 mcg/min (01/11/21 0554)   PRN Meds: acetaminophen, hydrALAZINE, labetalol   Vital Signs    Vitals:   01/11/21 0500 01/11/21 0530 01/11/21 0542 01/11/21 0600  BP: (!) 154/91 (!) 182/112  (!) 158/120  Pulse: 78     Resp: 13     Temp: 98.1 F (36.7 C)     TempSrc: Oral     SpO2: 100% 100%  100%  Weight:   64.5 kg   Height:        Intake/Output Summary (Last 24 hours) at 01/11/2021 0755 Last data filed at 01/11/2021 0554 Gross per 24 hour  Intake 267.05 ml  Output 2200 ml  Net -1932.95 ml   Last 3 Weights 01/11/2021 01/10/2021 05/03/2020  Weight (lbs) 142 lb 1.6 oz 125 lb 160 lb 8 oz  Weight (kg) 64.456 kg 56.7 kg 72.802 kg      Telemetry    NSR - Personally Reviewed  ECG    NSR, anteroseptal q waves in V1-V3; TWI in inferior leads and V6 - Personally Reviewed  Physical Exam   GEN: Sitting up in bed, NAD Neck: JVD to her ear Cardiac: RRR, no murmurs, rubs, or gallops.  Respiratory: Clear to auscultation bilaterally. GI: Palpable mass in the lower abdomen that is firm MS: 2+ pitting edema to the thighs.  Warm Neuro:  Nonfocal  Psych: Normal affect   Labs    High Sensitivity Troponin:   Recent Labs  Lab 01/10/21 1449 01/10/21 1600 01/10/21 1927  TROPONINIHS 802* 922* 888*      Chemistry Recent Labs  Lab 01/10/21 1054 01/10/21 1600 01/10/21 1641 01/11/21 0416  NA 143  --  142 144  K 4.9  --  4.4 3.4*  CL 107  --   --  109  CO2 15*  --   --  21*  GLUCOSE 104*  --   --  117*  BUN 57*  --   --  61*  CREATININE 1.92*  --   --  1.71*  CALCIUM 10.5*  --   --  9.5  PROT  --  6.4*  --   --   ALBUMIN  --  3.1*  --   --   AST  --  52*  --   --   ALT  --  34  --   --   ALKPHOS  --  61  --   --   BILITOT  --  2.8*  --   --   GFRNONAA 32*  --   --  37*  ANIONGAP 21*  --   --  14  Hematology Recent Labs  Lab 01/10/21 1054 01/10/21 1641  WBC 10.2  --   RBC 5.26*  --   HGB 9.5* 10.9*  HCT 34.1* 32.0*  MCV 64.8*  --   MCH 18.1*  --   MCHC 27.9*  --   RDW 20.5*  --   PLT 284  --     BNP Recent Labs  Lab 01/10/21 1054  BNP >4,500.0*     DDimer  Recent Labs  Lab 01/10/21 2125 01/11/21 0416  DDIMER 3.06* 2.40*     Radiology    CT ABDOMEN PELVIS WO CONTRAST  Result Date: 01/11/2021 CLINICAL DATA:  Abdominal distension.  COVID positive. EXAM: CT ABDOMEN AND PELVIS WITHOUT CONTRAST TECHNIQUE: Multidetector CT imaging of the abdomen and pelvis was performed following the standard protocol without IV contrast. COMPARISON:  None. FINDINGS: Lower chest: There is consolidation versus atelectasis at the left lung base.There is massive cardiomegaly. There is a small to moderate-sized pericardial effusion. The intracardiac blood pool is hypodense relative to the adjacent myocardium consistent with anemia. Hepatobiliary: The liver appears enlarged but is not well evaluated secondary to the lack of IV contrast and significant respiratory motion artifact. The gallbladder was not well evaluated.The biliary tree is not well evaluated. Pancreas: The pancreas is not well  evaluated. Spleen: Unremarkable. Adrenals/Urinary Tract: --Adrenal glands: The adrenal glands are not well evaluated. --Right kidney/ureter: There appears to be at least mild right-sided hydronephrosis. --Left kidney/ureter: There appears to be mild left-sided hydronephrosis. The left kidney appears atrophic. --Urinary bladder: The urinary bladder is displaced to the right. Stomach/Bowel: --Stomach/Duodenum: No hiatal hernia or other gastric abnormality. Normal duodenal course and caliber. --Small bowel: Unremarkable. --Colon: The colon is suboptimally evaluated. There does not appear to be colonic distension. --Appendix: The appendix cannot be reliably identified. Vascular/Lymphatic: Atherosclerotic calcification is present within the non-aneurysmal abdominal aorta, without hemodynamically significant stenosis. --evaluation for adenopathy is limited. Reproductive: There is a very large pelvic mass measuring approximately 23 x 19 x 12 cm. Is difficult to determine the exact origin of this mass, however it is favored to be uterine in origin. Neither ovary is well identified on this study. Other: There is a small volume of free fluid the patient's pelvis and abdomen. The abdominal wall is normal. Musculoskeletal. No acute displaced fractures. IMPRESSION: 1. Exam is severely limited by extensive motion artifact and lack of IV contrast. 2. There is a very large 23 cm mass arising from the patient's pelvis. This is favored to be uterine in origin, however evaluation is limited by lack of IV contrast. This may represent a large fibroid uterus. Neither ovary is well identified. Gynecologic consultation is recommended. 3. Small volume free fluid in the abdomen and pelvis. 4. Significant cardiomegaly with a small to moderate-sized pericardial effusion. 5. Anemia. 6.  Aortic Atherosclerosis (ICD10-I70.0). Electronically Signed   By: Katherine Mantle M.D.   On: 01/11/2021 01:05   DG Chest Portable 1 View  Result Date:  01/10/2021 CLINICAL DATA:  Fever and weakness.  Dizziness EXAM: PORTABLE CHEST 1 VIEW COMPARISON:  None. FINDINGS: Cardiopericardial enlargement, chronic. Poor visualization of the retrocardiac lung likely related to heart size, also seen previously. No edema, effusion, or pneumothorax. IMPRESSION: Chronic Cardiopericardial enlargement.  No pulmonary edema. Electronically Signed   By: Marnee Spring M.D.   On: 01/10/2021 11:43    Cardiac Studies  TTE 01/11/21: 1. Left ventricular ejection fraction, by estimation, is 20 to 25%. The  left ventricle has severely decreased function. The left  ventricle  demonstrates global hypokinesis. The left ventricular internal cavity size  was mildly to moderately dilated. There  is moderate concentric left ventricular hypertrophy. Left ventricular  diastolic parameters are consistent with Grade II diastolic dysfunction  (pseudonormalization). Elevated left ventricular end-diastolic pressure.  The average left ventricular global  longitudinal strain is -5.8 %. The global longitudinal strain is abnormal.  2. Right ventricular systolic function is moderately reduced. The right  ventricular size is normal. There is moderately elevated pulmonary artery  systolic pressure.  3. Left atrial size was mildly dilated.  4. Right atrial size was mildly dilated.  5. Moderate pericardial effusion. The pericardial effusion is  circumferential. There is no evidence of cardiac tamponade.  6. The mitral valve is normal in structure. Mild mitral valve  regurgitation. No evidence of mitral stenosis.  7. The aortic valve is tricuspid. Aortic valve regurgitation is not  visualized. No aortic stenosis is present.  8. The inferior vena cava is dilated in size with <50% respiratory  variability, suggesting right atrial pressure of 15 mmHg.   Comparison(s): Changes from prior study are noted.   TTE 04/2020: 1. Left ventricular ejection fraction, by estimation, is 40  to 45%. The  left ventricle has mildly decreased function. The left ventricle  demonstrates global hypokinesis. There is mild concentric left ventricular  hypertrophy. Left ventricular diastolic  parameters are consistent with Grade II diastolic dysfunction  (pseudonormalization). Elevated left atrial pressure.  2. Right ventricular systolic function is low normal. The right  ventricular size is normal. There is moderately elevated pulmonary artery  systolic pressure.  3. Left atrial size was mildly dilated.  4. Right atrial size was mildly dilated.  5. Moderate pericardial effusion. The pericardial effusion is  circumferential. There is no evidence of cardiac tamponade.  6. The mitral valve is normal in structure. No evidence of mitral valve  regurgitation.  7. Tricuspid valve regurgitation is mild to moderate.  8. The aortic valve is normal in structure. Aortic valve regurgitation is  not visualized.  9. The inferior vena cava is dilated in size with <50% respiratory  variability, suggesting right atrial pressure of 15 mmHg.  Patient Profile     45 y.o. 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 Course complicated by newly discovered pelvic mass measuring approximately 23 x 19 x 12 cm with evidence of bilateral mild hydronephrosis. Cardiology is consulted for management of SVT, acute on chronic systolic heart failure and elevated troponin.  Assessment & Plan    Acute on chronic combined systolic and diastolic heart failure NYHA class III symptoms. Acutely decompensated likely secondary to medication non-compliance, dietary non-compliance, SVT and COVID infection. Unfortunately lost to  follow-up and appears to have been steadily gaining weight with worsening LE edema, DOE, orthopnea for the past several months.TTE 04/2020 with EF 40-45% with grade II diastolic dysfunction-->now LVEF 20-25%. BNP currently >4500. Certainly the pelvic mass may be contributing to LE edema if there is significant venous compression, however, fortunately, there is no evidence of aortic compression. Further, this would not explain a significantly elevated BNP and thus, likely patient has underlying acute HF exacerbation in addition to compression. - TTE with LVEF ~20%, moderate RV dysfunction, moderate PAH, moderate  effusion, RAP >50mmHg - Continue lasix 40mg  IV BID--tolerating well - Up-titrate hydralazine to 75mg  TID  - Continue nitro gtt for afterload reduction--can transition to isordil once BP better - Will not up-titrate BB at this time due to acute decompensation and EF 20% - Ultimately goal to be on BB and ACE/ARB pending renal function - Monitor I/Os and daily standing weights - Will need extensive counseling about the need to follow-up as well as medication and dietary compliance  Hx of moderate pericardial effusion (04/2020) Noted during last admission on 04/2020. Repeat echo recommended but not performed. Cardiac silhouette very enlarged on CXR. Patient tachycardic but hypertensive with no signs of tamponade based exam. CT showed small-to-moderate effusion. Stable moderate effusion on TTE today. - Remains moderate on TTE today - Diuresis as above - If pelvic mass malignant, will need to sample pericardial fluid  PSVT Likely triggered by worsening volume overload and COVID-19 infection. S/p adenosine in the ED with return to sinus tachycardia. -Continue low dose metop  Severe, uncontrolled HTN Not compliant with home medications. G2DD on last TTE. On nitro gtt. -Increase hydralazine to 75mg  TID as above -Continue nitro gtt -Goal to be on BB and ACE/ARB once clinically compensated with  improved renal function  AKI Likely multifactorial in the setting of renovascular congestion as well as compression from large pelvic mass.  -Diuresis as above -Renally dose medications -Avoid nephrotoxins  COVID-19 infection - per primary  Pelvic Mass Large pelvic mass noted on CT measuring 23 x 19 x 12 cm. ? Fibroid uterus vs malignant origin. -Consult OBGYN -Management per primary team      For questions or updates, please contact [place] Please consult [email or web address] for contact info under        Signed, Freada Bergeron, MD  01/11/2021, 7:55 AM

## 2021-01-12 DIAGNOSIS — I5043 Acute on chronic combined systolic (congestive) and diastolic (congestive) heart failure: Secondary | ICD-10-CM | POA: Diagnosis not present

## 2021-01-12 DIAGNOSIS — I5082 Biventricular heart failure: Secondary | ICD-10-CM | POA: Diagnosis not present

## 2021-01-12 DIAGNOSIS — J1282 Pneumonia due to coronavirus disease 2019: Secondary | ICD-10-CM | POA: Diagnosis not present

## 2021-01-12 DIAGNOSIS — I471 Supraventricular tachycardia: Secondary | ICD-10-CM | POA: Diagnosis not present

## 2021-01-12 DIAGNOSIS — I161 Hypertensive emergency: Secondary | ICD-10-CM | POA: Diagnosis not present

## 2021-01-12 DIAGNOSIS — E876 Hypokalemia: Secondary | ICD-10-CM

## 2021-01-12 DIAGNOSIS — N179 Acute kidney failure, unspecified: Secondary | ICD-10-CM | POA: Diagnosis not present

## 2021-01-12 DIAGNOSIS — U071 COVID-19: Secondary | ICD-10-CM | POA: Diagnosis not present

## 2021-01-12 LAB — BASIC METABOLIC PANEL
Anion gap: 19 — ABNORMAL HIGH (ref 5–15)
BUN: 53 mg/dL — ABNORMAL HIGH (ref 6–20)
CO2: 20 mmol/L — ABNORMAL LOW (ref 22–32)
Calcium: 9.3 mg/dL (ref 8.9–10.3)
Chloride: 106 mmol/L (ref 98–111)
Creatinine, Ser: 1.56 mg/dL — ABNORMAL HIGH (ref 0.44–1.00)
GFR, Estimated: 41 mL/min — ABNORMAL LOW (ref >60)
Glucose, Bld: 85 mg/dL (ref 70–99)
Potassium: 3 mmol/L — ABNORMAL LOW (ref 3.5–5.1)
Sodium: 145 mmol/L (ref 135–145)

## 2021-01-12 LAB — CBC WITH DIFFERENTIAL/PLATELET
Abs Immature Granulocytes: 0 10*3/uL (ref 0.00–0.07)
Basophils Absolute: 0 10*3/uL (ref 0.0–0.1)
Basophils Relative: 0 %
Eosinophils Absolute: 0 10*3/uL (ref 0.0–0.5)
Eosinophils Relative: 0 %
HCT: 30.3 % — ABNORMAL LOW (ref 36.0–46.0)
Hemoglobin: 8.2 g/dL — ABNORMAL LOW (ref 12.0–15.0)
Lymphocytes Relative: 4 %
Lymphs Abs: 0.3 10*3/uL — ABNORMAL LOW (ref 0.7–4.0)
MCH: 17.2 pg — ABNORMAL LOW (ref 26.0–34.0)
MCHC: 27.1 g/dL — ABNORMAL LOW (ref 30.0–36.0)
MCV: 63.5 fL — ABNORMAL LOW (ref 80.0–100.0)
Monocytes Absolute: 0.2 10*3/uL (ref 0.1–1.0)
Monocytes Relative: 2 %
Neutro Abs: 7.8 10*3/uL — ABNORMAL HIGH (ref 1.7–7.7)
Neutrophils Relative %: 94 %
Platelets: 209 10*3/uL (ref 150–400)
RBC: 4.77 MIL/uL (ref 3.87–5.11)
RDW: 19.9 % — ABNORMAL HIGH (ref 11.5–15.5)
WBC: 8.3 10*3/uL (ref 4.0–10.5)
nRBC: 10 % — ABNORMAL HIGH (ref 0.0–0.2)
nRBC: 25 /100 WBC — ABNORMAL HIGH

## 2021-01-12 LAB — URINE CULTURE: Culture: <10000 — AB

## 2021-01-12 LAB — C-REACTIVE PROTEIN: CRP: 1.9 mg/dL — ABNORMAL HIGH (ref <1.0)

## 2021-01-12 LAB — D-DIMER, QUANTITATIVE: D-Dimer, Quant: 2.71 ug/mL-FEU — ABNORMAL HIGH (ref 0.00–0.50)

## 2021-01-12 LAB — FERRITIN: Ferritin: 13 ng/mL (ref 11–307)

## 2021-01-12 MED ORDER — METOPROLOL TARTRATE 25 MG PO TABS
37.5000 mg | ORAL_TABLET | Freq: Three times a day (TID) | ORAL | Status: DC
  Administered 2021-01-12 – 2021-01-15 (×9): 37.5 mg via ORAL
  Filled 2021-01-12 (×9): qty 1

## 2021-01-12 MED ORDER — ADENOSINE 6 MG/2ML IV SOLN
12.0000 mg | Freq: Once | INTRAVENOUS | Status: AC
  Administered 2021-01-12: 12 mg via INTRAVENOUS

## 2021-01-12 MED ORDER — POTASSIUM CHLORIDE CRYS ER 20 MEQ PO TBCR
40.0000 meq | EXTENDED_RELEASE_TABLET | Freq: Two times a day (BID) | ORAL | Status: AC
  Administered 2021-01-12 (×2): 40 meq via ORAL
  Filled 2021-01-12 (×2): qty 2

## 2021-01-12 MED ORDER — LIP MEDEX EX OINT
TOPICAL_OINTMENT | CUTANEOUS | Status: DC | PRN
  Administered 2021-01-13: 1 via TOPICAL
  Filled 2021-01-12 (×2): qty 7

## 2021-01-12 MED ORDER — SODIUM CHLORIDE 0.9 % IV SOLN
510.0000 mg | Freq: Once | INTRAVENOUS | Status: AC
  Administered 2021-01-12: 510 mg via INTRAVENOUS
  Filled 2021-01-12: qty 17

## 2021-01-12 MED ORDER — MEDROXYPROGESTERONE ACETATE 150 MG/ML IM SUSP
150.0000 mg | Freq: Once | INTRAMUSCULAR | Status: AC
  Administered 2021-01-12: 150 mg via INTRAMUSCULAR
  Filled 2021-01-12: qty 1

## 2021-01-12 MED ORDER — HYDRALAZINE HCL 50 MG PO TABS
100.0000 mg | ORAL_TABLET | Freq: Three times a day (TID) | ORAL | Status: DC
  Administered 2021-01-12 – 2021-01-13 (×3): 100 mg via ORAL
  Filled 2021-01-12 (×3): qty 2

## 2021-01-12 MED ORDER — ADENOSINE 6 MG/2ML IV SOLN
6.0000 mg | Freq: Once | INTRAVENOUS | Status: AC
  Administered 2021-01-12: 6 mg via INTRAVENOUS
  Filled 2021-01-12: qty 2

## 2021-01-12 NOTE — Significant Event (Signed)
Rapid Response Event Note   Reason for Call :  SVT  Needing Adenosine Dr Johney Frame (cardiology) at bedside  Initial Focused Assessment:  Patient in SVT rate 150s Patient is currently assymptomatic Alert and Oriented BP 147/105  HR 147  RR 19  O2 sat 98% on RA   Interventions:  Placed on Zoll and Venturi mask Initially gave 6 mg Adenosine IVP  No change in rhythm Gave 12mg  Adenoside IVP converted to SR  Able to remove O2, patient O2 sat on RA 98-100% Dr Johney Frame at bedside during event.  Plan of Care:     Event Summary:   MD Notified: Dr Johney Frame at bedside Call Time: 0929 Arrival Time: 7741 End Time: [place]  Raliegh Ip, RN

## 2021-01-12 NOTE — Progress Notes (Signed)
Gynecology Consult Progress Note  Current Date: 8/41/6606 30:16 PM  Holly Bradley is a 45 y.o. G0 admitted for cardiac issues. GYN consulted re: management of fibroid uterus and h/o abnormal uterine bleeding.    History complicated by: Patient Active Problem List   Diagnosis Date Noted   Fibroid 01/11/2021   Menorrhagia 01/11/2021   Acute exacerbation of CHF (congestive heart failure) ([place]) 01/10/2021   Pneumonia due to COVID-19 virus    Acute renal failure (HCC)    Anemia    Pelvic mass in female    Hypertensive emergency    SVT (supraventricular tachycardia) ([place]) 05/01/2020   CHF (congestive heart failure) ([place]) 05/01/2020    ROS and patient/family/surgical history, located on admission H&P note dated 01/10/2021, have been reviewed, and there are no changes except as noted below Yesterday/Overnight Events:  No GYN issues  Subjective:  See above.   Objective:    Current Vital Signs 24h Vital Sign Ranges  T 97.9 F (36.6 C) Temp  Avg: 98 F (36.7 C)  Min: 97.5 F (36.4 C)  Max: 98.4 F (36.9 C)  BP (!) 152/100 BP  Min: 120/76  Max: 167/104  HR 86 Pulse  Avg: 109.8  Min: 82  Max: 150  RR 19 Resp  Avg: 17.1  Min: 14  Max: 20  SaO2 100 % Room Air SpO2  Avg: 98.3 %  Min: 96 %  Max: 100 %       24 Hour I/O Current Shift I/O  Time Ins Outs 01/20 0701 - 01/21 0700 In: 1032.1 [P.O.:600; I.V.:432.1] Out: 2701 [Urine:2700] 01/21 0701 - 01/21 1900 In: 120 [P.O.:120] Out: -    Patient Vitals for the past 12 hrs:  BP Temp Temp src Pulse Resp SpO2 Weight  01/12/21 0900 (!) 152/100 -- -- -- -- -- --  01/12/21 0800 (!) 167/104 97.9 F (36.6 C) Oral 86 19 100 % --  01/12/21 0527 -- -- -- -- -- -- 60.8 kg  01/12/21 0511 (!) 156/99 -- -- -- 19 -- --  01/12/21 0400 (!) 145/91 (!) 97.5 F (36.4 C) Oral 85 18 100 % --  01/12/21 0300 (!) 153/85 -- -- -- 14 99 % --  01/12/21 0215 (!) 138/100 -- -- 82 14 99 % --  01/12/21 0150 (!) 136/103 -- -- (!) 150 14 99 % --   01/12/21 0100 (!) 140/102 98.3 F (36.8 C) -- (!) 144 15 96 % --    Physical exam: General appearance: alert, cooperative and appears stated age Lungs: Normal breathing Psych: appropriate Neurologic: Grossly normal  Medications Current Facility-Administered Medications  Medication Dose Route Frequency Provider Last Rate Last Admin   acetaminophen (TYLENOL) tablet 650 mg  650 mg Oral Q4H PRN Seawell, Jaimie A, DO       furosemide (LASIX) injection 40 mg  40 mg Intravenous BID Freada Bergeron, MD   40 mg at 01/12/21 1004   heparin injection 5,000 Units  5,000 Units Subcutaneous Q8H Seawell, Jaimie A, DO   5,000 Units at 01/12/21 0524   hydrALAZINE (APRESOLINE) injection 10 mg  10 mg Intravenous Q6H PRN Freada Bergeron, MD       hydrALAZINE (APRESOLINE) tablet 100 mg  100 mg Oral Q8H Pemberton, Heather E, MD       metoprolol tartrate (LOPRESSOR) tablet 37.5 mg  37.5 mg Oral TID Freada Bergeron, MD       nitroGLYCERIN 50 mg in dextrose 5 % 250 mL (0.2 mg/mL) infusion  0-200  mcg/min Intravenous Titrated Seawell, Jaimie A, DO 7.5 mL/hr at 01/12/21 1038 25 mcg/min at 01/12/21 1038   pantoprazole (PROTONIX) EC tablet 40 mg  40 mg Oral Daily Seawell, Jaimie A, DO   40 mg at 01/12/21 0849   potassium chloride SA (KLOR-CON) CR tablet 40 mEq  40 mEq Oral BID Seawell, Jaimie A, DO   40 mEq at 01/12/21 0850      Labs  Recent Labs  Lab 01/10/21 1054 01/10/21 1641 01/11/21 0416 01/12/21 0719  WBC 10.2  --  8.0 8.3  HGB 9.5* 10.9* 7.6* 8.2*  HCT 34.1* 32.0* 28.7* 30.3*  PLT 284  --  207 209    Radiology No new imaging  Assessment & Plan:  GYN stable I d/w her re: fibroid uterus management and I told her I would recommend medication management right now to hopefully help control her periods. I told her there is a risk the medications could make her bleeding worse, but I feel it worth it try something right now. I told her I'd recommend depo provera, which she is  amenable to. I told her that I suspect the depo provera wont work for her upcoming period that is due sometime this month. In this case, consideration to PO meds to help her bleeding  Plan: Depo provera ordered for patient Please call GYN when her period starts for consideration of starting PO meds to help her period Please consult GYN Oncology if primary team feels removal of fibroid mass would help her cardiac, etc situation Please let GYN know when patient is discharged to arrange outpatient follow up.   Code Status: Full Code  Total time taking care of the patient was 15 minutes, with greater than 50% of the time spent in face to face interaction with the patient.  Durene Romans MD Attending [facility] (Faculty Practice) GYN Consult Phone: [phone] (M-F, 0800-1700) & [phone] (Off hours, weekends, holidays)

## 2021-01-12 NOTE — Progress Notes (Signed)
PT Cancellation Note  Patient Details Name: Holly Bradley MRN: [id number] DOB: 1976/04/23   Cancelled Treatment:    Reason Eval/Treat Not Completed: Medical issues which prohibited therapy.  HR and BP elevations, recheck at another time.   Ramond Dial 01/12/2021, 10:36 AM   Mee Hives, PT MS Acute Rehab Dept. Number: [place] [facility] [place]

## 2021-01-12 NOTE — Progress Notes (Signed)
Subjective:  0000 to 0145: Patient in SVT with self conversion to normal sinus rhythm.  0930: Patient had another episode of SVT this morning, evaluated by cardiology and converted to sinus rhythm after 6mg  and 12mg  of adenosine.  1030: Patient reports that her breathing is much improved from prior to admission. She denies any new complaints or concerns. She does report that emotionally she feels "up and down." She states that she feels overwhelmed with everything that she has been going through over the past few days. She denies any specific requests, concerns or questions for our team.  Objective:  Vital signs in last 24 hours: Vitals:   01/12/21 0527 01/12/21 0800 01/12/21 0900 01/12/21 1515  BP:  (!) 167/104 (!) 152/100 (!) 148/93  Pulse:  86  90  Resp:  19  13  Temp:  97.9 F (36.6 C)  98 F (36.7 C)  TempSrc:  Oral  Oral  SpO2:  100%  100%  Weight: 60.8 kg     Height:      On room air  Intake/Output Summary (Last 24 hours) at 01/12/2021 1553 Last data filed at 01/12/2021 0918 Gross per 24 hour  Intake 792.1 ml  Output 2000 ml  Net -1207.9 ml   Filed Weights   01/10/21 1042 01/11/21 0542 01/12/21 0527  Weight: 56.7 kg 64.5 kg 60.8 kg   Physical Exam Constitutional:      General: She is not in acute distress.    Appearance: She is ill-appearing.     Comments: Anxious appearing female with minimal eye contact, sitting on bedside commode, holding a banana   Cardiovascular:     Rate and Rhythm: Normal rate and regular rhythm.     Pulses: Normal pulses.     Heart sounds: Normal heart sounds.  Pulmonary:     Effort: Pulmonary effort is normal.     Breath sounds: Normal breath sounds.  Abdominal:     Tenderness: There is no abdominal tenderness.     Comments: Large palpable, round, firm mass without irregularity extending from suprapubic region to mid abdomen   Musculoskeletal:     Right lower leg: Edema present.     Left lower leg: Edema present.     Comments: 2+  pitting edema to the bilateral thighs  Neurological:     General: No focal deficit present.     Mental Status: She is alert. Mental status is at baseline.  Psychiatric:        Attention and Perception: Attention normal.        Speech: Speech is tangential.    Labs in last 24 hours: CBC Latest Ref Rng & Units 01/12/2021 01/11/2021 01/10/2021  WBC 4.0 - 10.5 K/uL 8.3 8.0 -  Hemoglobin 12.0 - 15.0 g/dL 8.2(L) 7.6(L) 10.9(L)  Hematocrit 36.0 - 46.0 % 30.3(L) 28.7(L) 32.0(L)  Platelets 150 - 400 K/uL 209 207 -   BMP Latest Ref Rng & Units 01/12/2021 01/11/2021 01/10/2021  Glucose 70 - 99 mg/dL 85 117(H) -  BUN 6 - 20 mg/dL 53(H) 61(H) -  Creatinine 0.44 - 1.00 mg/dL 1.56(H) 1.71(H) -  Sodium 135 - 145 mmol/L 145 144 142  Potassium 3.5 - 5.1 mmol/L 3.0(L) 3.4(L) 4.4  Chloride 98 - 111 mmol/L 106 109 -  CO2 22 - 32 mmol/L 20(L) 21(L) -  Calcium 8.9 - 10.3 mg/dL 9.3 9.5 -  Ferritin - 13 from 11, 12 CRP - 1.9 from 3.3, 4.2 D-dimer - 2.71 from 2.40 and 3.06 Glucose:  98-115 Urine culture - No growth in 2 days Blood cultures - No growth in 2 days  Imaging in last 24 hours: No new imaging  Assessment/Plan:  Active Problems:   Acute exacerbation of CHF (congestive heart failure) ([place])   Pneumonia due to COVID-19 virus   Acute renal failure (HCC)   Anemia   Pelvic mass in female   Hypertensive emergency   Fibroid   Menorrhagia  Holly Bradley is a 45 year old female with past medical history significant for HTN, heart failure (echo 04/2020 - 40-45%, grade II diastolic dysfunction), paroxysmal SVT, and uterine fibroids complicated by menometrorrhagia and chronic blood loss anemia who presented to [facility] on 01/10/21 for evaluation of weakness found to be in SVT with conversion to normal sinus rhythm following adenosine administration, acute on chronic biventricular heart failure, hypertensive emergency, AKI, COVID-19 infection and 23cm uterine mass.  #Biventricular heart failure,  active #Moderate pericardial effusion, chronic During prior hospitalization in May, patient noted to have EF of 40-45% with grade 2 diastolic dysfunction. Patient was lost to follow-up and nonadherent to prescribed medications following discharge. BNP on admission elevated to >4500 with echocardiogram this morning revealing LVEF of 20-25%, moderate RV dysfunction, moderate pulmonary arterial hypertension, moderate pericardial effusion and elevated right atrial pressure. Patient hypervolemic on examination, however she is responding well to diuresis. -Cardiology following, greatly appreciate recommendations  -Continue furosemide 40mg  IV twice daily  -Increase hydralazine to 100mg  TID  -Continue nitroglycerin drip with plan to transition to entresto or bidil tomorrow  -Increase metoprolol to 37.5mg  TID  -Patient will need ischemic work-up once euvolemic  -Strict intake/output  -Daily weights  -Address factors contributing to nonadherence  #Severe hypertension, improving Patient's blood pressures continue to improve, most recently to [id number] systolic. -Cardiology consulted, appreciate recommendations  -Continue nitroglycerin drip with plan to transition to entresto or bidil tomorrow  -Increase hydralazine to 100mg  TID  -Increase metoprolol to 37.5mg  TID  #AKI, improving Patient's baseline creatinine of 0.90. Creatinine 1.9 on admission with improvement to 1.56 on most recent labs. Improvement with diuresis consistent with cardiorenal syndrome. Of note, CT Abdomen/Pelvis did reveal mild hydronephrosis and displacement of the urinary bladder which may be contributing to her AKI. Fortunately, patient continues to have appropriate urine output. -Furosemide 40mg  IV twice daily -Trend renal function on daily BMP -Follow-up urine culture -Avoid nephrotoxins   #SVT, recurrent  Patient continues to experience episodes of SVT (3 episodes since admission with conversion following adenosine and 1  episode with conversion to normal sinus rhythm spontaneously) -Cardiology consulted, appreciate recommendations   -Increase metoprolol to 37.5mg  TID  #COVID-19 infection, active Patient tested positive for COVID-19 on admission, however she is saturating well on room air. -Continue to monitor for development of symptoms or hypoxia -Airborne and contact precautions -Trend inflammatory markers (CRP, D-dimer, ferritin)  #Large uterine fibroids, chronic #Iron deficiency anemia 2/2 menorrhagia, chronic Patient noted to have 23cm pelvic mass on CT Abdomen/Pelvis which is consistent with prior MRI in 2019. Mass is significantly compressing intraabdominal structures given that it is filling a large portion of the peritoneal cavity. OBGYN following, recommending medication management for now and control of her menstrual bleeding. Patient would benefit from iron transfusion as ferritin is 11. -OB/GYN consulted, appreciate recommendations  -Depo provera ordered for patient  -Call GYN when period starts for consideration of starting PO medications  -Consult GYN Onc if patient requires removal of fibroid mass  -Inform GYN when patient is discharged to arrange outpatient follow-up -Feraheme transfusion today -Daily  CBC -Vitamin B12  #Hypokalemia, active Potassium of 3.0 this morning. -Potassium chloride 40mg  twice daily  #VTE ppx: Heparin 5,000 units Q8H #Diet: Heart healthy #Code status: Full code #Bowel regimen: None #PT/OT recs: pending  Cato Mulligan, MD 01/12/2021, 3:53 PM Pager: [phone] After 5pm on weekdays and 1pm on weekends: On Call pager [phone]

## 2021-01-12 NOTE — Progress Notes (Signed)
Progress Note  [name] Date of Encounter: 01/13/2021  [facility] Cardiologist: Glenetta Hew, MD   Subjective  Strange affect this morning. Often staring off into space and not responding to questions. States breathing is improved.   Tele not on this morning; HR inaccurate; pulse at bedside 70-80s K low at 3.1 Cr improved to 1.53 Net negative 1.98L Wt 134-->133lbs  Inpatient Medications    Scheduled Meds: . enoxaparin (LOVENOX) injection  40 mg Subcutaneous Q24H  . furosemide  40 mg Intravenous BID  . hydrALAZINE  100 mg Oral Q8H  . metoprolol tartrate  37.5 mg Oral TID  . pantoprazole  40 mg Oral Daily  . potassium chloride  40 mEq Oral Q4H   Continuous Infusions: . nitroGLYCERIN 5 mcg/min (01/13/21 0256)   PRN Meds: acetaminophen, hydrALAZINE, lip balm   Vital Signs    Vitals:   01/13/21 0514 01/13/21 0600 01/13/21 0615 01/13/21 0800  BP: (!) 162/105 (!) 136/120 (!) 145/99 (!) 145/102  Pulse:  84 90   Resp:  18 18 15   Temp:    98.6 F (37 C)  TempSrc:      SpO2:      Weight:      Height:        Intake/Output Summary (Last 24 hours) at 01/13/2021 1001 Last data filed at 01/13/2021 0200 Gross per 24 hour  Intake 300 ml  Output 1900 ml  Net -1600 ml   Last 3 Weights 01/13/2021 01/12/2021 01/11/2021  Weight (lbs) 133 lb 4.8 oz 134 lb 1.6 oz 142 lb 1.6 oz  Weight (kg) 60.464 kg 60.827 kg 64.456 kg      Telemetry    Not on this morning; previously NSR with HR 70-80s- Personally Reviewed  ECG    No new tracing - Personally Reviewed  Physical Exam   GEN: Strange affect, staring off into space, intermittently answering questions, NAD   Neck: JVD to ear Cardiac: RR, no murmurs  Respiratory: Clear to auscultation bilaterally. GI: Palpable, firm pelvic mass MS: 2+ pitting edema to the thighs. Warm Neuro:  Nonfocal  Psych: Strange affect, staring off into space   Labs    High Sensitivity Troponin:   Recent Labs  Lab  01/10/21 1449 01/10/21 1600 01/10/21 1927  TROPONINIHS 802* 922* 888*      Chemistry Recent Labs  Lab 01/10/21 1600 01/10/21 1641 01/11/21 0416 01/12/21 0335 01/13/21 0427  NA  --    < > 144 145 144  K  --    < > 3.4* 3.0* 3.1*  CL  --   --  109 106 105  CO2  --   --  21* 20* 22  GLUCOSE  --   --  117* 85 101*  BUN  --   --  61* 53* 51*  CREATININE  --   --  1.71* 1.56* 1.53*  CALCIUM  --   --  9.5 9.3 9.9  PROT 6.4*  --   --   --   --   ALBUMIN 3.1*  --   --   --   --   AST 52*  --   --   --   --   ALT 34  --   --   --   --   ALKPHOS 61  --   --   --   --   BILITOT 2.8*  --   --   --   --   GFRNONAA  --   --  37* 41* 42*  ANIONGAP  --   --  14 19* 17*   < > = values in this interval not displayed.     Hematology Recent Labs  Lab 01/11/21 0416 01/12/21 0719 01/13/21 0427  WBC 8.0 8.3 6.2  RBC 4.36 4.77 4.76  HGB 7.6* 8.2* 8.2*  HCT 28.7* 30.3* 30.6*  MCV 65.8* 63.5* 64.3*  MCH 17.4* 17.2* 17.2*  MCHC 26.5* 27.1* 26.8*  RDW 19.8* 19.9* 19.9*  PLT 207 209 170    BNP Recent Labs  Lab 01/10/21 1054  BNP >4,500.0*     DDimer  Recent Labs  Lab 01/11/21 0416 01/12/21 0335 01/13/21 0427  DDIMER 2.40* 2.71* 2.36*     Radiology    No results found.  Cardiac Studies   TTE 01/11/21: 1. Left ventricular ejection fraction, by estimation, is 20 to 25%. The  left ventricle has severely decreased function. The left ventricle  demonstrates global hypokinesis. The left ventricular internal cavity size  was mildly to moderately dilated. There  is moderate concentric left ventricular hypertrophy. Left ventricular  diastolic parameters are consistent with Grade II diastolic dysfunction  (pseudonormalization). Elevated left ventricular end-diastolic pressure.  The average left ventricular global  longitudinal strain is -5.8 %. The global longitudinal strain is abnormal.  2. Right ventricular systolic function is moderately reduced. The right   ventricular size is normal. There is moderately elevated pulmonary artery  systolic pressure.  3. Left atrial size was mildly dilated.  4. Right atrial size was mildly dilated.  5. Moderate pericardial effusion. The pericardial effusion is  circumferential. There is no evidence of cardiac tamponade.  6. The mitral valve is normal in structure. Mild mitral valve  regurgitation. No evidence of mitral stenosis.  7. The aortic valve is tricuspid. Aortic valve regurgitation is not  visualized. No aortic stenosis is present.  8. The inferior vena cava is dilated in size with <50% respiratory  variability, suggesting right atrial pressure of 15 mmHg.   Comparison(s): Changes from prior study are noted.   TTE 04/2020: 1. Left ventricular ejection fraction, by estimation, is 40 to 45%. The  left ventricle has mildly decreased function. The left ventricle  demonstrates global hypokinesis. There is mild concentric left ventricular  hypertrophy. Left ventricular diastolic  parameters are consistent with Grade II diastolic dysfunction  (pseudonormalization). Elevated left atrial pressure.  2. Right ventricular systolic function is low normal. The right  ventricular size is normal. There is moderately elevated pulmonary artery  systolic pressure.  3. Left atrial size was mildly dilated.  4. Right atrial size was mildly dilated.  5. Moderate pericardial effusion. The pericardial effusion is  circumferential. There is no evidence of cardiac tamponade.  6. The mitral valve is normal in structure. No evidence of mitral valve  regurgitation.  7. Tricuspid valve regurgitation is mild to moderate.  8. The aortic valve is normal in structure. Aortic valve regurgitation is  not visualized.  9. The inferior vena cava is dilated in size with <50% respiratory  variability, suggesting right atrial pressure of 15 mmHg.  CT abd/pelvis 01/11/21: IMPRESSION: 1. Exam is severely limited by  extensive motion artifact and lack of IV contrast. 2. There is a very large 23 cm mass arising from the patient's pelvis. This is favored to be uterine in origin, however evaluation is limited by lack of IV contrast. This may represent a large fibroid uterus. Neither ovary is well identified. Gynecologic consultation is recommended. 3. Small volume free fluid in  the abdomen and pelvis. 4. Significant cardiomegaly with a small to moderate-sized pericardial effusion. 5. Anemia. 6.  Aortic Atherosclerosis (ICD10-I70.0).  Patient Profile     45 y.o. female with a hx of chronic systolic and diastolic heart failure,SVT, chronic anemia secondary to uterine fibroids,and HTN who presented with worsening Holly edema, weakness and dizziness found to be in SVT s/p adenosine x2 doses with conversion to sinus tachycardia with BNP 4500 and trop of 900 with concern with acute HF exacerbation. Course complicated by newly discovered pelvic massmeasuring approximately 23 x 19 x 12 cmwith evidence of bilateral mild hydronephrosis. Cardiology is consulted for management of SVT, acute on chronic systolic heart failure and elevated troponin.  Assessment & Plan    Acute on chroniccombinedsystolic and diastolic heart failure NYHA class III symptoms. Acutely decompensated likely secondary to medication non-compliance, dietary non-compliance, SVT and COVID infection. Unfortunately lost to follow-up and appears to have been steadily gaining weight with worsening Holly edema, DOE, orthopnea for the past several months.TTE 04/2020 with EF 40-45% with grade II diastolic dysfunction-->now LVEF 20-25%. BNP currently >4500.Certainly the pelvic mass may be contributing to Holly edema if there is significant venous compression, however, fortunately, there is no evidence of aortic compression. Further, this would not explain a significantly elevated BNP and thus, likely patient has underlying acute HF exacerbation in addition to  compression. - TTE with LVEF ~20%, moderate RV dysfunction, moderate PAH, moderate effusion, RAP >33mmHg -Continue lasix 40mg  IV BID--tolerating well with good output - Transition from nitro gtt to bidil-->given concern for psychiatric diagnosis and non-compliance, likely safer to proceed with bidil over entresto at this time -Continue metop to 37.5mg  TID due to recurrent episodes of SVT - Could consider ischemic work-up once more euvolemic but I am concerned about underlying psychiatric issues and history of non-compliance - Monitor I/Os and daily standing weights - Will need extensive counseling about the need to follow-up as well as medication and dietary compliance  Hx of moderate pericardial effusion (04/2020) Noted during last admission on 04/2020. Lost to follow-up.CT on 01/11/21 showed small-to-moderate effusion.TTE on 01/11/21 with stable moderate effusion; no tamponade. - Remains moderate on TTE; will repeat once more euvolemic - Diuresis as above - If pelvic mass malignant, will need to sample pericardial fluid  PSVT Likely triggered by worsening volume overload and COVID-19 infection. S/p adenosine in the ED with initial return to NSR. Developed recurrent SVT this morning 01/12/21 s/p adenosine 6mg  then 12mg  with conversion back to sinus. -Continue metop 37.5mg  TID as above  Severe, uncontrolled HTN Not compliant with home medications.G2DD on last TTE. On nitro gtt. -Transition to bidil today - Ultimately goal would be to be onBB, entresto, spiro, and SGLT-2 inhibitor--but concerned about compliance and underlying psychiatric illness and therefore entresto/spiro may not be good options for her  AKI Likely multifactorial in the setting of renovascular congestion as well as compression from large pelvic mass.Improving with diuresis. -Diuresis as above -Renally dose medications -Avoid nephrotoxins  COVID-19 infection - per primary  Pelvic Mass Large pelvic mass  noted on CT measuring23 x 19 x 12 cm. ? Fibroid uterus vs malignant origin. -OBGYN recommends conservative treatment at this time -Management per primary team  Possible Psychiatric Illness -Agree with psych consult      For questions or updates, please contact [place] Please consult [email or web address] for contact info under        Signed, Freada Bergeron, MD  01/13/2021, 10:01 AM

## 2021-01-12 NOTE — Evaluation (Signed)
Occupational Therapy Evaluation Patient Details Name: Holly Bradley MRN: [id number] DOB: 08-13-1976 Today's Date: 01/12/2021    History of Present Illness 45 y.o. female with a hx of chronic systolic and diastolic heart failure, SVT, chronic anemia secondary to uterine fibroids, and HTN who presented with worsening LE edema, weakness and dizziness found to be in SVT.   Clinical Impression   Patient admitted for the above diagnosis.  Currently the patient is presenting as very lethargic.  Difficulty maintaining LOA, falling asleep throughout.  Attempted to sit patient up at the edge of bed, but unable to stay awake.  OT to continue on going mobility and ADL assessment.  It appears she should do well, but discharge recommendations will be update as needed.  Of note, BP 138/89.      Follow Up Recommendations  No OT follow up    Equipment Recommendations  None recommended by OT    Recommendations for Other Services       Precautions / Restrictions Precautions Precautions: Fall Precaution Comments: Watch HR and BP Restrictions Weight Bearing Restrictions: No      Mobility Bed Mobility Overal bed mobility: Needs Assistance Bed Mobility: Supine to Sit;Sit to Supine     Supine to sit: Min assist Sit to supine: Min assist        Transfers                 General transfer comment: not attempted due to decreased LOA    Balance Overall balance assessment: Needs assistance Sitting-balance support: Bilateral upper extremity supported;Feet supported Sitting balance-Leahy Scale: Poor Sitting balance - Comments: decreased LOA                                   ADL either performed or assessed with clinical judgement   ADL Overall ADL's : Needs assistance/impaired     Grooming: Wash/dry hands;Wash/dry face;Min guard;Sitting                                 General ADL Comments: Attempted to sit patient edge of bed and given cold wash rag  to increase LOA.  Patient falling asleep, and laid back in bed.  ADL to be assessed on going.     Vision Patient Visual Report: No change from baseline       Perception     Praxis      Pertinent Vitals/Pain Pain Assessment: No/denies pain     Hand Dominance Right   Extremity/Trunk Assessment Upper Extremity Assessment Upper Extremity Assessment: Generalized weakness   Lower Extremity Assessment Lower Extremity Assessment: Defer to PT evaluation   Cervical / Trunk Assessment Cervical / Trunk Assessment: Normal   Communication Communication Communication: No difficulties   Cognition Arousal/Alertness: Lethargic Behavior During Therapy: Flat affect Overall Cognitive Status: Difficult to assess                                                      Home Living Family/patient expects to be discharged to:: Private residence Living Arrangements: Other relatives (Brother) Available Help at Discharge: Family;Available PRN/intermittently Type of Home: Apartment       Home Layout: One level     Bathroom Shower/Tub: Tub/shower unit  Bathroom Toilet: Standard     Home Equipment: None          Prior Functioning/Environment Level of Independence: Independent                 OT Problem List: Decreased strength;Decreased activity tolerance;Impaired balance (sitting and/or standing)      OT Treatment/Interventions: Self-care/ADL training;Therapeutic exercise;Balance training;Therapeutic activities    OT Goals(Current goals can be found in the care plan section) Acute Rehab OT Goals Patient Stated Goal: none stated OT Goal Formulation: Patient unable to participate in goal setting Time For Goal Achievement: 01/26/21 Potential to Achieve Goals: Fair ADL Goals Pt Will Perform Grooming: with supervision;sitting;standing Pt Will Perform Lower Body Bathing: with min guard assist;sit to/from stand Pt Will Perform Lower Body Dressing: with  min guard assist;sit to/from stand Pt Will Transfer to Toilet: with min guard assist;ambulating;regular height toilet Pt Will Perform Toileting - Clothing Manipulation and hygiene: with supervision;sit to/from stand  OT Frequency: Min 2X/week   Barriers to D/C:    none noted       Co-evaluation              AM-PAC OT "6 Clicks" Daily Activity     Outcome Measure Help from another person eating meals?: A Little Help from another person taking care of personal grooming?: A Little Help from another person toileting, which includes using toliet, bedpan, or urinal?: A Little Help from another person bathing (including washing, rinsing, drying)?: A Lot Help from another person to put on and taking off regular upper body clothing?: A Little Help from another person to put on and taking off regular lower body clothing?: A Lot 6 Click Score: 16   End of Session Nurse Communication: Mobility status  Activity Tolerance: Patient limited by lethargy Patient left: in bed;with call bell/phone within reach;with bed alarm set  OT Visit Diagnosis: Unsteadiness on feet (R26.81);Muscle weakness (generalized) (M62.81)                Time: 9735-3299 OT Time Calculation (min): 20 min Charges:  OT General Charges $OT Visit: 1 Visit OT Evaluation $OT Eval Moderate Complexity: 1 Mod  01/12/2021  Rich, OTR/L  Acute Rehabilitation Services  Office:  [place] 01/12/2021, 2:13 PM

## 2021-01-12 NOTE — Progress Notes (Signed)
Patient HR 150s SVT RR called and MD at the beside for treatment.

## 2021-01-12 NOTE — Progress Notes (Signed)
   01/12/21 0100  Assess: MEWS Score  Temp 98.3 F (36.8 C)  BP (!) 140/102  Pulse Rate (!) 144  ECG Heart Rate (!) 144  Resp 15  Level of Consciousness Alert  SpO2 96 %  O2 Device Room Air  Assess: MEWS Score  MEWS Temp 0  MEWS Systolic 0  MEWS Pulse 3  MEWS RR 0  MEWS LOC 0  MEWS Score 3  MEWS Score Color Yellow  Assess: if the MEWS score is Yellow or Red  Were vital signs taken at a resting state? Yes  Focused Assessment No change from prior assessment  Early Detection of Sepsis Score *See Row Information* Low  MEWS guidelines implemented *See Row Information* Yes  Treat  MEWS Interventions Escalated (See documentation below)  Pain Scale 0-10  Pain Score 0  Take Vital Signs  Increase Vital Sign Frequency  Yellow: Q 2hr X 2 then Q 4hr X 2, if remains yellow, continue Q 4hrs  Escalate  MEWS: Escalate Yellow: discuss with charge nurse/RN and consider discussing with provider and RRT  Notify: Charge Nurse/RN  Name of Charge Nurse/RN Notified Lyn, RN  Date Charge Nurse/RN Notified 01/12/21  Time Charge Nurse/RN Notified 0105  Notify: Provider  Provider Name/Title Allyson Sabal, MD  Date Provider Notified 01/12/21  Time Provider Notified 0100  Notification Type Page  Notification Reason Change in status  Response See new orders  Date of Provider Response 01/12/21  Time of Provider Response 0150   At approximately 0200 patient converted from sinus tachycardia to normal sinus at a rate of 84. EKG performed.

## 2021-01-12 NOTE — Progress Notes (Signed)
PT Cancellation Note  Patient Details Name: Holly Bradley MRN: [id number] DOB: [birth year]   Cancelled Treatment:    Reason Eval/Treat Not Completed: Patient's level of consciousness.  HR is more controlled but pt is unable to open her eyes and keep them open when PT arrived to see her.  Retry at another time.   Ramond Dial 01/12/2021, 3:33 PM   Mee Hives, PT MS Acute Rehab Dept. Number: [place] [facility] [place]

## 2021-01-13 DIAGNOSIS — I5043 Acute on chronic combined systolic (congestive) and diastolic (congestive) heart failure: Secondary | ICD-10-CM | POA: Diagnosis not present

## 2021-01-13 DIAGNOSIS — U071 COVID-19: Secondary | ICD-10-CM | POA: Diagnosis not present

## 2021-01-13 DIAGNOSIS — N179 Acute kidney failure, unspecified: Secondary | ICD-10-CM | POA: Diagnosis not present

## 2021-01-13 DIAGNOSIS — F22 Delusional disorders: Secondary | ICD-10-CM

## 2021-01-13 DIAGNOSIS — I161 Hypertensive emergency: Secondary | ICD-10-CM | POA: Diagnosis not present

## 2021-01-13 DIAGNOSIS — I5082 Biventricular heart failure: Secondary | ICD-10-CM | POA: Diagnosis not present

## 2021-01-13 DIAGNOSIS — I471 Supraventricular tachycardia: Secondary | ICD-10-CM | POA: Diagnosis not present

## 2021-01-13 DIAGNOSIS — J1282 Pneumonia due to coronavirus disease 2019: Secondary | ICD-10-CM | POA: Diagnosis not present

## 2021-01-13 LAB — C-REACTIVE PROTEIN: CRP: 0.9 mg/dL (ref <1.0)

## 2021-01-13 LAB — CBC WITH DIFFERENTIAL/PLATELET
Abs Immature Granulocytes: 0.08 10*3/uL — ABNORMAL HIGH (ref 0.00–0.07)
Basophils Absolute: 0 10*3/uL (ref 0.0–0.1)
Basophils Relative: 0 %
Eosinophils Absolute: 0 10*3/uL (ref 0.0–0.5)
Eosinophils Relative: 0 %
HCT: 30.6 % — ABNORMAL LOW (ref 36.0–46.0)
Hemoglobin: 8.2 g/dL — ABNORMAL LOW (ref 12.0–15.0)
Immature Granulocytes: 1 %
Lymphocytes Relative: 5 %
Lymphs Abs: 0.3 10*3/uL — ABNORMAL LOW (ref 0.7–4.0)
MCH: 17.2 pg — ABNORMAL LOW (ref 26.0–34.0)
MCHC: 26.8 g/dL — ABNORMAL LOW (ref 30.0–36.0)
MCV: 64.3 fL — ABNORMAL LOW (ref 80.0–100.0)
Monocytes Absolute: 1.1 10*3/uL — ABNORMAL HIGH (ref 0.1–1.0)
Monocytes Relative: 17 %
Neutro Abs: 4.8 10*3/uL (ref 1.7–7.7)
Neutrophils Relative %: 77 %
Platelets: 170 10*3/uL (ref 150–400)
RBC: 4.76 MIL/uL (ref 3.87–5.11)
RDW: 19.9 % — ABNORMAL HIGH (ref 11.5–15.5)
WBC: 6.2 10*3/uL (ref 4.0–10.5)
nRBC: 11.9 % — ABNORMAL HIGH (ref 0.0–0.2)

## 2021-01-13 LAB — BASIC METABOLIC PANEL
Anion gap: 17 — ABNORMAL HIGH (ref 5–15)
BUN: 51 mg/dL — ABNORMAL HIGH (ref 6–20)
CO2: 22 mmol/L (ref 22–32)
Calcium: 9.9 mg/dL (ref 8.9–10.3)
Chloride: 105 mmol/L (ref 98–111)
Creatinine, Ser: 1.53 mg/dL — ABNORMAL HIGH (ref 0.44–1.00)
GFR, Estimated: 42 mL/min — ABNORMAL LOW (ref >60)
Glucose, Bld: 101 mg/dL — ABNORMAL HIGH (ref 70–99)
Potassium: 3.1 mmol/L — ABNORMAL LOW (ref 3.5–5.1)
Sodium: 144 mmol/L (ref 135–145)

## 2021-01-13 LAB — D-DIMER, QUANTITATIVE: D-Dimer, Quant: 2.36 ug/mL-FEU — ABNORMAL HIGH (ref 0.00–0.50)

## 2021-01-13 LAB — VITAMIN B12: Vitamin B-12: 3441 pg/mL — ABNORMAL HIGH (ref 180–914)

## 2021-01-13 LAB — FERRITIN: Ferritin: 27 ng/mL (ref 11–307)

## 2021-01-13 LAB — MAGNESIUM: Magnesium: 2.1 mg/dL (ref 1.7–2.4)

## 2021-01-13 MED ORDER — POTASSIUM CHLORIDE CRYS ER 20 MEQ PO TBCR
40.0000 meq | EXTENDED_RELEASE_TABLET | ORAL | Status: DC
  Administered 2021-01-13: 40 meq via ORAL
  Filled 2021-01-13: qty 2

## 2021-01-13 MED ORDER — POTASSIUM CHLORIDE CRYS ER 20 MEQ PO TBCR
40.0000 meq | EXTENDED_RELEASE_TABLET | ORAL | Status: AC
  Administered 2021-01-13 (×2): 40 meq via ORAL
  Filled 2021-01-13 (×2): qty 2

## 2021-01-13 MED ORDER — ENOXAPARIN SODIUM 40 MG/0.4ML ~~LOC~~ SOLN
40.0000 mg | SUBCUTANEOUS | Status: DC
  Administered 2021-01-13: 40 mg via SUBCUTANEOUS
  Filled 2021-01-13: qty 0.4

## 2021-01-13 MED ORDER — ISOSORB DINITRATE-HYDRALAZINE 20-37.5 MG PO TABS
2.0000 | ORAL_TABLET | Freq: Three times a day (TID) | ORAL | Status: DC
  Administered 2021-01-13 – 2021-01-27 (×43): 2 via ORAL
  Filled 2021-01-13 (×43): qty 2

## 2021-01-13 NOTE — Evaluation (Signed)
Physical Therapy Evaluation and Discharge Patient Details Name: Holly Bradley MRN: [id number] DOB: 26-Apr-1976 Today's Date: 01/13/2021   History of Present Illness  45 y.o. female with a hx of chronic systolic and diastolic heart failure, SVT, chronic anemia secondary to uterine fibroids, and HTN who presented with worsening LE edema, weakness and dizziness found to be in SVT.  Clinical Impression   Patient evaluated by Physical Therapy with no further PT needs identified. Patient independent walking in her room and with rhomberg eyes open and eyes closed. PT is signing off. Thank you for this referral.     Follow Up Recommendations No PT follow up    Equipment Recommendations  None recommended by PT    Recommendations for Other Services       Precautions / Restrictions Precautions Precautions: Fall (related to cognition and arrhythmias) Precaution Comments: Watch HR and BP      Mobility  Bed Mobility Overal bed mobility: Independent             General bed mobility comments: standing in room on arrival; RN reports allowing pt up to [facility] on her own    Transfers Overall transfer level: Independent                  Ambulation/Gait Ambulation/Gait assistance: Independent Gait Distance (Feet): 50 Feet (including backwards) Assistive device: None Gait Pattern/deviations: WFL(Within Functional Limits)     General Gait Details: no unsteadiness with 180 turns, side stepping or walking backwards  Stairs            Wheelchair Mobility    Modified Rankin (Stroke Patients Only)       Balance Overall balance assessment: Independent                       Rhomberg - Eyes Opened: 30 Rhomberg - Eyes Closed: 10                 Pertinent Vitals/Pain Pain Assessment: No/denies pain    Home Living Family/patient expects to be discharged to:: Private residence Living Arrangements: Other relatives (brother) Available Help at Discharge:  Family;Available PRN/intermittently Type of Home: Apartment       Home Layout: One level Home Equipment: None Additional Comments: pt evasive; information from previous OT session    Prior Function Level of Independence: Independent               Hand Dominance        Extremity/Trunk Assessment   Upper Extremity Assessment Upper Extremity Assessment: Overall WFL for tasks assessed    Lower Extremity Assessment Lower Extremity Assessment: Overall WFL for tasks assessed    Cervical / Trunk Assessment Cervical / Trunk Assessment: Other exceptions  Communication   Communication: No difficulties  Cognition Arousal/Alertness: Awake/alert Behavior During Therapy: Anxious (irritable; evasive) Overall Cognitive Status: Difficult to assess                                 General Comments: pt oriented, however states she doesn't really have COVID and has no symptoms (pt with runny nose throughout session).; didn't want pulse ox on her finger and then wouldn't let PT take it off;      General Comments      Exercises     Assessment/Plan    PT Assessment Patent does not need any further PT services  PT Problem List  PT Treatment Interventions      PT Goals (Current goals can be found in the Care Plan section)  Acute Rehab PT Goals Patient Stated Goal: none stated PT Goal Formulation: All assessment and education complete, DC therapy    Frequency     Barriers to discharge        Co-evaluation               AM-PAC PT "6 Clicks" Mobility  Outcome Measure Help needed turning from your back to your side while in a flat bed without using bedrails?: None Help needed moving from lying on your back to sitting on the side of a flat bed without using bedrails?: None Help needed moving to and from a bed to a chair (including a wheelchair)?: None Help needed standing up from a chair using your arms (e.g., wheelchair or bedside chair)?:  None Help needed to walk in hospital room?: None Help needed climbing 3-5 steps with a railing? : None 6 Click Score: 24    End of Session   Activity Tolerance: Patient tolerated treatment well Patient left: in bed;with call bell/phone within reach;with bed alarm set Nurse Communication: Mobility status;Other (comment) (pt up in room on arrival (RN had cleared this with pt)) PT Visit Diagnosis: Other abnormalities of gait and mobility (R26.89)    Time: 2440-1027 PT Time Calculation (min) (ACUTE ONLY): 13 min   Charges:   PT Evaluation $PT Eval Low Complexity: 1 Low           Arby Barrette, PT Pager [phone]   Rexanne Mano 01/13/2021, 4:05 PM

## 2021-01-13 NOTE — Progress Notes (Signed)
Subjective:   Patient is intermittently somnolent when seen on rounds today. At times she is difficult to arouse but then wakes up and is very interactive. She allows examination but asks what the purpose is and would prefer to be left alone. She denies chest pain, shortness of breath or abdominal pain. She is having bowel movements ok.   Objective:  Vital signs in last 24 hours: Vitals:   01/13/21 0430 01/13/21 0514 01/13/21 0600 01/13/21 0615  BP: (!) 131/94 (!) 162/105 (!) 136/120 (!) 145/99  Pulse: 73  84 90  Resp: 19  18 18   Temp:      TempSrc:      SpO2: 99%     Weight: 60.5 kg     Height:        Constitution: difficult to arouse, chronically ill appearing HENT: MMM, chapped lips  Eyes: EOM intact, no injection  Cardio: RRR, no m/r/g, +2 LE pitting edema  Respiratory: CTA, no w/r/r Abdominal: large pelvic mass extending to the gastric area, firm, abdomen NTTP.  MSK: moving all extremities Neuro: paranoid affect, oriented, intermittently falling asleep during exam  Skin: c/d/i   Assessment/Plan:  Active Problems:   Acute exacerbation of CHF (congestive heart failure) (HCC)   Pneumonia due to COVID-19 virus   Acute renal failure (HCC)   Anemia   Pelvic mass in female   Hypertensive emergency   Fibroid   Menorrhagia  Holly Bradley is a 45 year old female with past medical history significant for HTN, heart failure (echo 04/2020 - 40-45%, grade II diastolic dysfunction), paroxysmal SVT, and uterine fibroids complicated by menometrorrhagia and chronic blood loss anemia who presented to [facility] on 01/10/21 for evaluation of weaknessfound to be in SVT with conversion to normal sinus rhythm following adenosine administration, acute on chronic biventricular heart failure,hypertensive emergency,AKI, COVID-19 infection and 23cm uterine mass.  #Biventricular heart failure, active #Moderate pericardial effusion, chronic #Severe Hypertension - improved  During prior  hospitalization in May, patient noted to have EF of 40-45% with grade 2 diastolic dysfunction. Patient was lost to follow-up and nonadherent to prescribed medications following discharge. BNP on admission elevated to >4500 with echo revealing LVEF of 20-25%, moderate RV dysfunction, moderate pulmonary arterial hypertension, moderate pericardial effusion and elevated right atrial pressure. Patient hypervolemic on examination, however she is responding well to diuresis.  - net negative 1.9L overnight, total net 5.5 -Cardiology consulted, greatly appreciate recommendations -Continue furosemide 40mg  IV twice daily -continue hydralazine to 100mg  TID  -stop nitro, starting bidil -continue metoprolol to 37.5mg  TID  -Possible ischemic work-up once euvolemic -Strict intake/output -Daily weights  -Address factors contributing to nonadherence    #AKI, improving Baseline cr. of 0.90. Improvement with diuresis consistent with cardiorenal syndrome. Stable today compared to yesterday, this is possibly her new baseline. . Of note, CT Abdomen/Pelvis did reveal mild hydronephrosis and displacement of the urinary bladder which may be contributing to her AKI. Fortunately, patient continues to have appropriate urine output.  -Furosemide 40mg  IV twice daily -Trend renal function on daily BMP -urine cx with insignificant growth -Avoid nephrotoxins  #SVT, recurrent  Patient continues to experience episodes of SVT (3 episodes since admission with conversion following adenosine and 1 episode with conversion to normal sinus rhythm spontaneously). Telemetry today innacurate. Rate is regular on exam. Repeat ECG shows NSR.   -Cardiology consulted, appreciate recommendations -Increase metoprolol to 37.5mg  TID  #COVID-19 infection, active Patient tested positive for COVID-19 on admission, however she is saturating well on room air.  -  Continue to monitor for development of symptoms or hypoxia -Airborne and  contact precautions -Trend inflammatory markers (CRP, D-dimer, ferritin)  #Paranoia Patient exhibiting paranoid and abnormal behavior since admission. Initially thought to be in the setting of acute illness, HF, and possible metabolic encephalopathy. Unclear if this is chronic or what other history may be present. Today she is falling asleep on exam, but intermittently interactive with paranoid affect.   - will reach out to family   #Large uterine fibroids, chronic #Iron deficiency anemia 2/2 menorrhagia, chronic Patient noted to have 23cm pelvic mass on CT Abdomen/Pelvis with new left hydronephrosis. 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 Mass is significantly compressing intraabdominal structures given that it is filling a large portion of the peritoneal cavity. OBGYN following, recommending medication management for now and control of her menstrual bleeding. Patient would benefit from iron transfusion as ferritin is 11. -OB/GYN consulted, appreciate recommendations  -Depo provera started  -Call GYN when period starts for consideration of starting PO medications - will need to start VTE prophylaxis today and consider holding once menstrual cycle starts.   -Consult GYN Onc if patient requires removal of fibroid mass  -Inform GYN when patient is discharged to arrange outpatient follow-up -Feraheme transfusion yesterday  -Daily CBC -Vitamin B12  #Hypokalemia, active Potassium of 3.0 this morning. -increased potassium to tid today  VTE: lovenox IVF: none Diet: HH Code: full   Dispo: Anticipated discharge pending clinical improvement.   Seawell, Jaimie A, DO 01/13/2021, 7:03 AM Pager: [phone] After 5pm on weekdays and 1pm on weekends: On Call Pager: [phone]

## 2021-01-13 NOTE — Progress Notes (Addendum)
I was called from [place] that patient was in V-Fib, Ran to pt room and pt was alert oriented siting on BSC with Tapping legs with 2 Leads off. Charge nurse there  to witness patients status. After fast tapping of the legs stopped. Pt went back into sinus Rhythm. Replaced leads against fast tapping Left leg Believed to be contributing to Vib-Fib look on the monitor.

## 2021-01-13 NOTE — Progress Notes (Addendum)
Patient Monitor reading bizarre undetectable rhythm. Completed EKG per order. Also changed cables which corrected the connection for analyzed rhythm.  B/p elevated Po anti hypertensive and nitro was discontinued  Patient denies pain at this time. Has intermitted pain with distended  tightiness of the abdomen when using the bathroom.

## 2021-01-13 NOTE — Progress Notes (Signed)
Progress Note  [name] Date of Encounter: 01/14/2021  [facility] Cardiologist: Glenetta Hew, MD   Subjective   Feels better. No chest pain. Breathing improved. No palpitations  In NSR this AM with HR 70-80s Blood pressures overall much better 120-150s Cr improved to 1.34 Net negative 422 Wt up to 134lbs from 133  Inpatient Medications    Scheduled Meds: . enoxaparin (LOVENOX) injection  40 mg Subcutaneous Q24H  . furosemide  80 mg Intravenous BID  . isosorbide-hydrALAZINE  2 tablet Oral TID  . metoprolol tartrate  37.5 mg Oral TID  . pantoprazole  40 mg Oral Daily  . potassium chloride  40 mEq Oral BID   Continuous Infusions:  PRN Meds: acetaminophen, hydrALAZINE, lip balm   Vital Signs    Vitals:   01/14/21 0000 01/14/21 0136 01/14/21 0600 01/14/21 0800  BP: 103/70  (!) 151/90 (!) 152/90  Pulse:   88 93  Resp: 17  16 18   Temp: 98 F (36.7 C)  97.7 F (36.5 C)   TempSrc: Rectal  Oral   SpO2: 99%  99% 96%  Weight:  61.1 kg    Height:        Intake/Output Summary (Last 24 hours) at 01/14/2021 1106 Last data filed at 01/14/2021 0830 Gross per 24 hour  Intake 720 ml  Output 950 ml  Net -230 ml   Last 3 Weights 01/14/2021 01/13/2021 01/12/2021  Weight (lbs) 134 lb 9.6 oz 133 lb 4.8 oz 134 lb 1.6 oz  Weight (kg) 61.054 kg 60.464 kg 60.827 kg      Telemetry    NSR - Personally Reviewed  ECG    No new tracing - Personally Reviewed  Physical Exam   GEN: No acute distress.   Neck: JVD to ear Cardiac: RRR, no murmurs, rubs, or gallops.  Respiratory: Clear to auscultation bilaterally. GI: Palpable pelvic mass MS: 2+ edema to thighs. Warm Neuro:  Nonfocal  Psych: Normal affect   Labs    High Sensitivity Troponin:   Recent Labs  Lab 01/10/21 1449 01/10/21 1600 01/10/21 1927  TROPONINIHS 802* 922* 888*      Chemistry Recent Labs  Lab 01/10/21 1600 01/10/21 1641 01/12/21 0335 01/13/21 0427 01/14/21 0344  NA   --    < > 145 144 139  K  --    < > 3.0* 3.1* 3.6  CL  --    < > 106 105 103  CO2  --    < > 20* 22 24  GLUCOSE  --    < > 85 101* 97  BUN  --    < > 53* 51* 47*  CREATININE  --    < > 1.56* 1.53* 1.34*  CALCIUM  --    < > 9.3 9.9 9.5  PROT 6.4*  --   --   --   --   ALBUMIN 3.1*  --   --   --   --   AST 52*  --   --   --   --   ALT 34  --   --   --   --   ALKPHOS 61  --   --   --   --   BILITOT 2.8*  --   --   --   --   GFRNONAA  --    < > 41* 42* 50*  ANIONGAP  --    < > 19* 17* 12   < > =  values in this interval not displayed.     Hematology Recent Labs  Lab 01/12/21 0719 01/13/21 0427 01/14/21 0344  WBC 8.3 6.2 6.1  RBC 4.77 4.76 4.37  HGB 8.2* 8.2* 7.6*  HCT 30.3* 30.6* 28.2*  MCV 63.5* 64.3* 64.5*  MCH 17.2* 17.2* 17.4*  MCHC 27.1* 26.8* 27.0*  RDW 19.9* 19.9* 20.2*  PLT 209 170 145*    BNP Recent Labs  Lab 01/10/21 1054  BNP >4,500.0*     DDimer  Recent Labs  Lab 01/12/21 0335 01/13/21 0427 01/14/21 0344  DDIMER 2.71* 2.36* 1.92*     Radiology    No results found.  Cardiac Studies   TTE 01/11/21: 1. Left ventricular ejection fraction, by estimation, is 20 to 25%. The  left ventricle has severely decreased function. The left ventricle  demonstrates global hypokinesis. The left ventricular internal cavity size  was mildly to moderately dilated. There  is moderate concentric left ventricular hypertrophy. Left ventricular  diastolic parameters are consistent with Grade II diastolic dysfunction  (pseudonormalization). Elevated left ventricular end-diastolic pressure.  The average left ventricular global  longitudinal strain is -5.8 %. The global longitudinal strain is abnormal.  2. Right ventricular systolic function is moderately reduced. The right  ventricular size is normal. There is moderately elevated pulmonary artery  systolic pressure.  3. Left atrial size was mildly dilated.  4. Right atrial size was mildly dilated.  5. Moderate  pericardial effusion. The pericardial effusion is  circumferential. There is no evidence of cardiac tamponade.  6. The mitral valve is normal in structure. Mild mitral valve  regurgitation. No evidence of mitral stenosis.  7. The aortic valve is tricuspid. Aortic valve regurgitation is not  visualized. No aortic stenosis is present.  8. The inferior vena cava is dilated in size with <50% respiratory  variability, suggesting right atrial pressure of 15 mmHg.   Comparison(s): Changes from prior study are noted.   TTE 04/2020: 1. Left ventricular ejection fraction, by estimation, is 40 to 45%. The  left ventricle has mildly decreased function. The left ventricle  demonstrates global hypokinesis. There is mild concentric left ventricular  hypertrophy. Left ventricular diastolic  parameters are consistent with Grade II diastolic dysfunction  (pseudonormalization). Elevated left atrial pressure.  2. Right ventricular systolic function is low normal. The right  ventricular size is normal. There is moderately elevated pulmonary artery  systolic pressure.  3. Left atrial size was mildly dilated.  4. Right atrial size was mildly dilated.  5. Moderate pericardial effusion. The pericardial effusion is  circumferential. There is no evidence of cardiac tamponade.  6. The mitral valve is normal in structure. No evidence of mitral valve  regurgitation.  7. Tricuspid valve regurgitation is mild to moderate.  8. The aortic valve is normal in structure. Aortic valve regurgitation is  not visualized.  9. The inferior vena cava is dilated in size with <50% respiratory  variability, suggesting right atrial pressure of 15 mmHg.  CT abd/pelvis 01/11/21: IMPRESSION: 1. Exam is severely limited by extensive motion artifact and lack of IV contrast. 2. There is a very large 23 cm mass arising from the patient's pelvis. This is favored to be uterine in origin, however evaluation is limited by  lack of IV contrast. This may represent a large fibroid uterus. Neither ovary is well identified. Gynecologic consultation is recommended. 3. Small volume free fluid in the abdomen and pelvis. 4. Significant cardiomegaly with a small to moderate-sized pericardial effusion. 5. Anemia. 6. Aortic Atherosclerosis (  ICD10-I70.0).  Patient Profile     45 y.o. female with a hx of chronic systolic and diastolic heart failure,SVT, chronic anemia secondary to uterine fibroids,and HTN who presented with worsening LE edema, weakness and dizziness found to be in SVT s/p adenosine x2 doses with conversion to sinus tachycardia with BNP 4500 and trop of 900 with concern with acute HF exacerbation. Course complicated by newly discovered pelvic massmeasuring approximately 23 x 19 x 12 cmwith evidence of bilateral mild hydronephrosis. Cardiology is consulted for management of SVT, acute on chronic systolic heart failure and elevated troponin.  Assessment & Plan    Acute on chroniccombinedsystolic and diastolic heart failure NYHA class III symptoms. Acutely decompensated likely secondary to medication non-compliance, dietary non-compliance, SVT and COVID infection. Unfortunately lost to follow-up and appears to have been steadily gaining weight with worsening LE edema, DOE, orthopnea for the past several months.TTE 04/2020 with EF 40-45% with grade II diastolic dysfunction-->now LVEF 20-25%. BNP currently >4500.Certainly the pelvic mass may be contributing to LE edema if there is significant venous compression, however, fortunately, there is no evidence of aortic compression. Further, this would not explain a significantly elevated BNP and thus, likely patient has underlying acute HF exacerbation in addition to compression. - TTE with LVEF ~20%, moderate RV dysfunction, moderate PAH, moderate effusion, RAP >48mmHg -Increase lasix to 80mg  IV BID--JVD still elevated and needs continued diuresis - Continue bidil  for afterload reduction-->given concern for psychiatric diagnosis and non-compliance, likely safer to proceed with bidil over entresto at this time -Continue metop to 37.5mg  TID due to recurrent episodes of SVT - Could consider ischemic work-up once more euvolemic but I am concerned about underlying psychiatric issues and history of non-compliance - Monitor I/Os and daily standing weights - Will need extensive counseling about the need to follow-up as well as medication and dietary compliance  Hx of moderate pericardial effusion (04/2020) Noted during last admission on 04/2020.Lost to follow-up.CTon 01/20/22showed small-to-moderate effusion.TTE on 01/11/21 with stable moderate effusion; no tamponade. - Remains moderate on TTE; will repeat once more euvolemic - Diuresis as above - If pelvic mass malignant, will need to sample pericardial fluid  PSVT Likely triggered by worsening volume overload and COVID-19 infection. S/p adenosine in the ED withinitial return to NSR. Developed recurrent SVT this morning 01/12/21 s/p adenosine 6mg  then 12mg  with conversion back to sinus. -Continue metop 37.5mg  TID as above  Severe, uncontrolled HTN Not compliant with home medications.G2DD on last TTE. On nitro gtt. -Continue bidil and metop as above - Ultimately goal would be to be onBB, entresto, spiro, and SGLT-2 inhibitor--but concerned about compliance and underlying psychiatric illness and therefore entresto/spiro may not be good options for her  AKI Likely multifactorial in the setting of renovascular congestion as well as compression from large pelvic mass.Improving with diuresis. -Diuresis as above -Renally dose medications -Avoid nephrotoxins  COVID-19 infection - per primary  Pelvic Mass Large pelvic mass noted on CT measuring23 x 19 x 12 cm. ? Fibroid uterus vs malignant origin. -Plan to consult Gyn-Onc -Management per primary team  Possible Psychiatric Illness -Agree  with psych consult     For questions or updates, please contact [name] Please consult [email or web address] for contact info under        Signed, Freada Bergeron, MD  01/14/2021, 11:06 AM

## 2021-01-13 NOTE — Progress Notes (Signed)
Patient Experiencing 8 VT at Rest with Congested Cough. States she is ok made MD Aware.

## 2021-01-13 NOTE — Progress Notes (Signed)
   01/13/21 0400  Assess: MEWS Score  Temp 98 F (36.7 C)  BP (!) 160/113  Pulse Rate (!) 141  ECG Heart Rate (!) 141  Resp 14  Level of Consciousness Alert  SpO2 98 %  O2 Device Room Air  Assess: MEWS Score  MEWS Temp 0  MEWS Systolic 0  MEWS Pulse 3  MEWS RR 0  MEWS LOC 0  MEWS Score 3  MEWS Score Color Yellow  Assess: if the MEWS score is Yellow or Red  Were vital signs taken at a resting state? Yes  Focused Assessment No change from prior assessment  Early Detection of Sepsis Score *See Row Information* Low  Treat  MEWS Interventions Escalated (See documentation below)  Pain Scale 0-10  Pain Score 0  Take Vital Signs  Increase Vital Sign Frequency  Yellow: Q 2hr X 2 then Q 4hr X 2, if remains yellow, continue Q 4hrs  Escalate  MEWS: Escalate Yellow: discuss with charge nurse/RN and consider discussing with provider and RRT  Notify: Charge Nurse/RN  Name of Charge Nurse/RN Notified Jaquetta, RN  Date Charge Nurse/RN Notified 01/13/21  Time Charge Nurse/RN Notified 0410  Notify: Provider  Provider Name/Title Allyson Sabal, MD  Date Provider Notified 01/13/21  Time Provider Notified [phone]  Notification Type Page  Notification Reason Other (Comment) (HR sustaining in 140's)  Response No new orders (patient returned to [place] without interventions)  Date of Provider Response 01/13/21  Time of Provider Response [phone]  Document  Patient Outcome Other (Comment) (stabalized without intervention)  Progress note created (see row info) Yes   Patient's Sinus tachycardia resolved without interventions. MD aware.

## 2021-01-13 NOTE — Progress Notes (Signed)
Patient has remained awake from 1900 until present. Patient states that she usually stays up at night. Encouraged to try sleep.

## 2021-01-14 DIAGNOSIS — I471 Supraventricular tachycardia: Secondary | ICD-10-CM | POA: Diagnosis not present

## 2021-01-14 DIAGNOSIS — R600 Localized edema: Secondary | ICD-10-CM | POA: Diagnosis present

## 2021-01-14 DIAGNOSIS — I5043 Acute on chronic combined systolic (congestive) and diastolic (congestive) heart failure: Secondary | ICD-10-CM | POA: Diagnosis not present

## 2021-01-14 DIAGNOSIS — I161 Hypertensive emergency: Secondary | ICD-10-CM | POA: Diagnosis not present

## 2021-01-14 LAB — BASIC METABOLIC PANEL
Anion gap: 12 (ref 5–15)
BUN: 47 mg/dL — ABNORMAL HIGH (ref 6–20)
CO2: 24 mmol/L (ref 22–32)
Calcium: 9.5 mg/dL (ref 8.9–10.3)
Chloride: 103 mmol/L (ref 98–111)
Creatinine, Ser: 1.34 mg/dL — ABNORMAL HIGH (ref 0.44–1.00)
GFR, Estimated: 50 mL/min — ABNORMAL LOW (ref >60)
Glucose, Bld: 97 mg/dL (ref 70–99)
Potassium: 3.6 mmol/L (ref 3.5–5.1)
Sodium: 139 mmol/L (ref 135–145)

## 2021-01-14 LAB — CBC WITH DIFFERENTIAL/PLATELET
Abs Immature Granulocytes: 0.06 10*3/uL (ref 0.00–0.07)
Basophils Absolute: 0 10*3/uL (ref 0.0–0.1)
Basophils Relative: 0 %
Eosinophils Absolute: 0 10*3/uL (ref 0.0–0.5)
Eosinophils Relative: 0 %
HCT: 28.2 % — ABNORMAL LOW (ref 36.0–46.0)
Hemoglobin: 7.6 g/dL — ABNORMAL LOW (ref 12.0–15.0)
Immature Granulocytes: 1 %
Lymphocytes Relative: 7 %
Lymphs Abs: 0.4 10*3/uL — ABNORMAL LOW (ref 0.7–4.0)
MCH: 17.4 pg — ABNORMAL LOW (ref 26.0–34.0)
MCHC: 27 g/dL — ABNORMAL LOW (ref 30.0–36.0)
MCV: 64.5 fL — ABNORMAL LOW (ref 80.0–100.0)
Monocytes Absolute: 1 10*3/uL (ref 0.1–1.0)
Monocytes Relative: 16 %
Neutro Abs: 4.6 10*3/uL (ref 1.7–7.7)
Neutrophils Relative %: 76 %
Platelets: 145 10*3/uL — ABNORMAL LOW (ref 150–400)
RBC: 4.37 MIL/uL (ref 3.87–5.11)
RDW: 20.2 % — ABNORMAL HIGH (ref 11.5–15.5)
WBC: 6.1 10*3/uL (ref 4.0–10.5)
nRBC: 13 % — ABNORMAL HIGH (ref 0.0–0.2)

## 2021-01-14 LAB — D-DIMER, QUANTITATIVE: D-Dimer, Quant: 1.92 ug/mL-FEU — ABNORMAL HIGH (ref 0.00–0.50)

## 2021-01-14 LAB — FERRITIN: Ferritin: 167 ng/mL (ref 11–307)

## 2021-01-14 LAB — C-REACTIVE PROTEIN: CRP: 0.7 mg/dL (ref <1.0)

## 2021-01-14 LAB — MAGNESIUM: Magnesium: 1.8 mg/dL (ref 1.7–2.4)

## 2021-01-14 MED ORDER — FUROSEMIDE 10 MG/ML IJ SOLN
80.0000 mg | Freq: Two times a day (BID) | INTRAMUSCULAR | Status: DC
  Administered 2021-01-14 – 2021-01-15 (×3): 80 mg via INTRAVENOUS
  Filled 2021-01-14 (×3): qty 8

## 2021-01-14 MED ORDER — MAGNESIUM SULFATE 2 GM/50ML IV SOLN
2.0000 g | Freq: Once | INTRAVENOUS | Status: AC
  Administered 2021-01-14: 2 g via INTRAVENOUS
  Filled 2021-01-14: qty 50

## 2021-01-14 MED ORDER — POTASSIUM CHLORIDE CRYS ER 20 MEQ PO TBCR
40.0000 meq | EXTENDED_RELEASE_TABLET | Freq: Two times a day (BID) | ORAL | Status: AC
  Administered 2021-01-14 (×2): 40 meq via ORAL
  Filled 2021-01-14 (×2): qty 2

## 2021-01-14 MED ORDER — ENOXAPARIN SODIUM 40 MG/0.4ML ~~LOC~~ SOLN
40.0000 mg | SUBCUTANEOUS | Status: DC
  Administered 2021-01-14 – 2021-01-26 (×13): 40 mg via SUBCUTANEOUS
  Filled 2021-01-14 (×13): qty 0.4

## 2021-01-14 MED ORDER — FUROSEMIDE 10 MG/ML IJ SOLN
40.0000 mg | Freq: Once | INTRAMUSCULAR | Status: AC
  Administered 2021-01-14: 40 mg via INTRAVENOUS
  Filled 2021-01-14: qty 4

## 2021-01-14 NOTE — Progress Notes (Signed)
Subjective:   Overnight, no acute events.  This morning, when asked if provider can enter the room, patient responds with "I guess." Patient reports that her breathing continues to improve however it still "has a ways to go." She denies chest pain or palpitations. She denies any new complaints, concerns or questions.  Objective:  Vital signs in last 24 hours: Vitals:   01/14/21 0600 01/14/21 0800 01/14/21 1000 01/14/21 1217  BP: (!) 151/90 (!) 152/90 114/65 124/76  Pulse: 88 93  77  Resp: 16 18 16 18   Temp: 97.7 F (36.5 C)  97.8 F (36.6 C) 97.7 F (36.5 C)  TempSrc: Oral  Oral Oral  SpO2: 99% 96% 98% 100%  Weight:      Height:        Intake/Output Summary (Last 24 hours) at 01/14/2021 1448 Last data filed at 01/14/2021 1400 Gross per 24 hour  Intake 480 ml  Output 2850 ml  Net -2370 ml   Filed Weights   01/12/21 0527 01/13/21 0430 01/14/21 0136  Weight: 60.8 kg 60.5 kg 61.1 kg  Physical Exam Vitals and nursing note reviewed.  Constitutional:      General: She is not in acute distress.    Appearance: She is ill-appearing.  Neck:     Comments: JVD to ear Cardiovascular:     Rate and Rhythm: Normal rate and regular rhythm.     Pulses: Normal pulses.     Heart sounds: Normal heart sounds.  Pulmonary:     Effort: Pulmonary effort is normal. No respiratory distress.     Breath sounds: Normal breath sounds.  Abdominal:     Tenderness: There is no abdominal tenderness.     Comments: Large, round, firm, palpable mass extending from suprapubic region to mid abdomen  Musculoskeletal:     Right lower leg: Edema present.     Left lower leg: Edema present.     Comments: 2+ pitting edema of bilateral lower extremities  Neurological:     General: No focal deficit present.     Mental Status: She is alert. Mental status is at baseline.  Psychiatric:        Mood and Affect: Mood is anxious.        Speech: Speech is tangential.        Thought Content: Thought content is  paranoid.   Labs in last 24 hours: CBC Latest Ref Rng & Units 01/14/2021 01/13/2021 01/12/2021  WBC 4.0 - 10.5 K/uL 6.1 6.2 8.3  Hemoglobin 12.0 - 15.0 g/dL 7.6(L) 8.2(L) 8.2(L)  Hematocrit 36.0 - 46.0 % 28.2(L) 30.6(L) 30.3(L)  Platelets 150 - 400 K/uL 145(L) 170 209   BMP Latest Ref Rng & Units 01/14/2021 01/13/2021 01/12/2021  Glucose 70 - 99 mg/dL 97 101(H) 85  BUN 6 - 20 mg/dL 47(H) 51(H) 53(H)  Creatinine 0.44 - 1.00 mg/dL 1.34(H) 1.53(H) 1.56(H)  Sodium 135 - 145 mmol/L 139 144 145  Potassium 3.5 - 5.1 mmol/L 3.6 3.1(L) 3.0(L)  Chloride 98 - 111 mmol/L 103 105 106  CO2 22 - 32 mmol/L 24 22 20(L)  Calcium 8.9 - 10.3 mg/dL 9.5 9.9 9.3  Magnesium - 1.8 D-dimer: 1.92 from 2.36, 2.71, 2.40, 3.06 CRP: 0.7 from 0.9, 1.9, 3.3, 4.2 Ferritin: 167 from 27, 13, 11, 12  Imaging in last 24 hours: No results found.  Assessment/Plan:  Active Problems:   Acute exacerbation of CHF (congestive heart failure) ([place])   Pneumonia due to COVID-19 virus   Acute renal failure ([place])  Anemia   Pelvic mass in female   Hypertensive emergency   Fibroid   Menorrhagia  Holly Bradley is a 45 year old female with past medical history significant for HTN, combined systolic and diastolic heart failure (echo 04/2020 - 40-45%, grade II diastolic dysfunction), paroxysmal SVT, and uterine fibroids complicated by menometrorrhagia and chronic blood loss anemia who presented to [facility] on 01/10/21 for evaluation of weaknessfound to be in SVT, acute on chronic biventricular heart failure,hypertensive emergency,AKI, COVID-19 infection and 23cm uterine mass.  #Biventricular heart failure, active #Moderate pericardial effusion, chronic #Hypertension, chronic During prior hospitalization in May, patient noted to have EF of 40-45% with grade 2 diastolic dysfunction. Patient was lost to follow-up and nonadherent to prescribed medications following discharge. BNP on admission elevated to >4500 with echo revealing  LVEF of 20-25%, moderate RV dysfunction, moderate pulmonary arterial hypertension, moderate pericardial effusion and elevated right atrial pressure. Patient continues to remain hypervolemic on examination and weight is up to 61.1kg from 60.5kg yesterday -Cardiology following, greatly appreciate recommendations -Increase furosemide to 80mg  IV twice daily  -Continue two tablets of isosorbide-hydralazine 20-37.5mg  three times daily -Continue metoprolol 37.5mg  TID  -Possible ischemic work-up once more euvolemic  -Possible repeat echocardiogram once more euvolemic -Strict intake/output -Daily weights  -Address factors contributing to nonadherence   #AKI, improving Baseline cr. of 0.90. Improvement to 1.3 with diuresis consistent with cardiorenal syndrome. Of note, CT Abdomen/Pelvis did reveal mild hydronephrosis and displacement of the urinary bladder which may be contributing to her AKI. Fortunately, patient continues to have appropriate urine output. -Furosemide 80mg  IV twice daily -Trend renal function on daily BMP -Avoid nephrotoxins  #SVT, recurrent  Patient continues to experience episodes of SVT (3 episodes since admission with conversion following adenosine and 1 episode with conversion to normal sinus rhythm spontaneously). 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 Mass is significantly compressing intraabdominal structures given that it is filling a large portion of the peritoneal cavity. -GYN following, appreciate recommendations  -Depo provera administered 01/12/21  -Call GYN when period starts for consideration of  starting PO medications  -Consult GYN Onc if patient requires removal of fibroid mass  -Inform GYN when patient is discharged to arrange outpatient follow-up -Daily CBC  #COVID-19 infection, active Patient tested positive for COVID-19 on admission, however she is saturating well on room air. Inflammatory markers continue to trend down daily. -Continue to monitor for development of symptoms or hypoxia -Airborne and contact precautions -Trend inflammatory markers (CRP, D-dimer, ferritin)  #Paranoia, active Patient's brother reports patient has a history of paranoia, however symptoms appear to have acutely worsened over the past few weeks. -Continue to monitor, patient may benefit from psychiatry consultation  #Hypokalemia, active Potassium of 3.6 this morning. -Potassium chloride 52mEq twice daily  #Hypomagnesemia, active Magnesium of 1.8 on morning labs. -Replete with 2g bolus of magnesium sulfate  #VTE ppx: enoxaparin 40mg  daily #IVF: None #Diet: Heart healthy #Code status: Full code #PT/OT recs: PT has signed off. #Dispo: Anticipated discharge pending clinical improvement.   Cato Mulligan, MD 01/14/2021, 2:48 PM Pager: [phone] After 5pm on weekdays and 1pm on weekends: On Call Pager: [phone]

## 2021-01-14 NOTE — Plan of Care (Signed)
°  Problem: Education: °Goal: Ability to demonstrate management of disease process will improve °Outcome: Progressing °Goal: Ability to verbalize understanding of medication therapies will improve °Outcome: Progressing °Goal: Individualized Educational Video(s) °Outcome: Progressing °  °

## 2021-01-15 ENCOUNTER — Inpatient Hospital Stay (HOSPITAL_COMMUNITY): Payer: BLUE CROSS/BLUE SHIELD

## 2021-01-15 DIAGNOSIS — I5082 Biventricular heart failure: Secondary | ICD-10-CM | POA: Diagnosis not present

## 2021-01-15 DIAGNOSIS — N179 Acute kidney failure, unspecified: Secondary | ICD-10-CM | POA: Diagnosis not present

## 2021-01-15 DIAGNOSIS — I5043 Acute on chronic combined systolic (congestive) and diastolic (congestive) heart failure: Secondary | ICD-10-CM | POA: Diagnosis not present

## 2021-01-15 DIAGNOSIS — J1282 Pneumonia due to coronavirus disease 2019: Secondary | ICD-10-CM | POA: Diagnosis not present

## 2021-01-15 DIAGNOSIS — I472 Ventricular tachycardia: Secondary | ICD-10-CM

## 2021-01-15 DIAGNOSIS — I471 Supraventricular tachycardia: Secondary | ICD-10-CM | POA: Diagnosis not present

## 2021-01-15 DIAGNOSIS — U071 COVID-19: Secondary | ICD-10-CM | POA: Diagnosis not present

## 2021-01-15 DIAGNOSIS — D500 Iron deficiency anemia secondary to blood loss (chronic): Secondary | ICD-10-CM

## 2021-01-15 LAB — CBC WITH DIFFERENTIAL/PLATELET
Abs Immature Granulocytes: 0.06 10*3/uL (ref 0.00–0.07)
Basophils Absolute: 0 10*3/uL (ref 0.0–0.1)
Basophils Relative: 0 %
Eosinophils Absolute: 0 10*3/uL (ref 0.0–0.5)
Eosinophils Relative: 0 %
HCT: 28 % — ABNORMAL LOW (ref 36.0–46.0)
Hemoglobin: 7.6 g/dL — ABNORMAL LOW (ref 12.0–15.0)
Immature Granulocytes: 1 %
Lymphocytes Relative: 9 %
Lymphs Abs: 0.7 10*3/uL (ref 0.7–4.0)
MCH: 17.3 pg — ABNORMAL LOW (ref 26.0–34.0)
MCHC: 27.1 g/dL — ABNORMAL LOW (ref 30.0–36.0)
MCV: 63.8 fL — ABNORMAL LOW (ref 80.0–100.0)
Monocytes Absolute: 1 10*3/uL (ref 0.1–1.0)
Monocytes Relative: 13 %
Neutro Abs: 5.9 10*3/uL (ref 1.7–7.7)
Neutrophils Relative %: 77 %
Platelets: 163 10*3/uL (ref 150–400)
RBC: 4.39 MIL/uL (ref 3.87–5.11)
RDW: 20.3 % — ABNORMAL HIGH (ref 11.5–15.5)
WBC: 7.6 10*3/uL (ref 4.0–10.5)
nRBC: 18.8 % — ABNORMAL HIGH (ref 0.0–0.2)

## 2021-01-15 LAB — BASIC METABOLIC PANEL
Anion gap: 12 (ref 5–15)
BUN: 38 mg/dL — ABNORMAL HIGH (ref 6–20)
CO2: 23 mmol/L (ref 22–32)
Calcium: 9 mg/dL (ref 8.9–10.3)
Chloride: 100 mmol/L (ref 98–111)
Creatinine, Ser: 1.26 mg/dL — ABNORMAL HIGH (ref 0.44–1.00)
GFR, Estimated: 53 mL/min — ABNORMAL LOW (ref >60)
Glucose, Bld: 93 mg/dL (ref 70–99)
Potassium: 3.6 mmol/L (ref 3.5–5.1)
Sodium: 135 mmol/L (ref 135–145)

## 2021-01-15 LAB — CULTURE, BLOOD (ROUTINE X 2)
Culture: NO GROWTH
Culture: NO GROWTH
Special Requests: ADEQUATE

## 2021-01-15 LAB — MAGNESIUM: Magnesium: 1.8 mg/dL (ref 1.7–2.4)

## 2021-01-15 IMAGING — CT CT HEAD W/O CM
5 of 8 series · 17 of 47 positions shown, 18 images · non-contrast
Comparison: None.

CLINICAL DATA: Inpatient. COVID positive. Mental status change with
worsening confusion. No reported injury.

EXAM:
CT HEAD WITHOUT CONTRAST
TECHNIQUE: Contiguous axial images were obtained from the base of the skull
through the vertex without intravenous contrast.

[Series 3: head without · axial · non-contrast · 0.43mm/px · z∈[-78,-28]mm · 2 of 30 slices shown, 3 images (1 of 2)]
[im 10/30  brain]
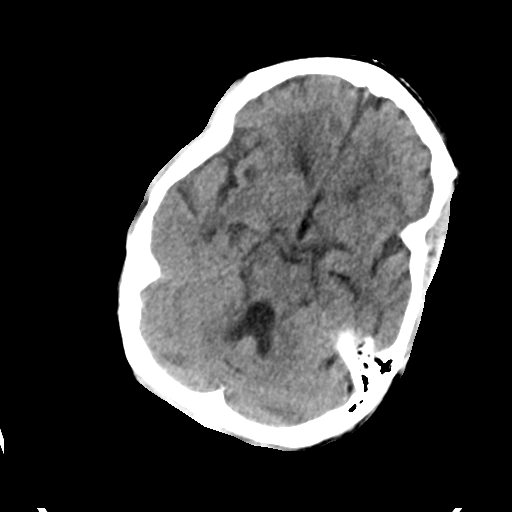
[im 10/30  bone]
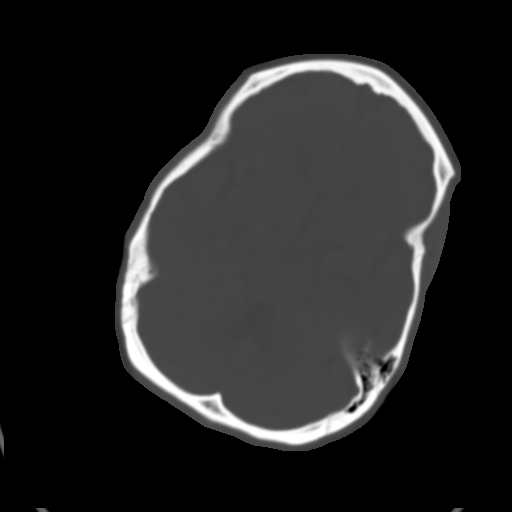
[im 20/30  brain]
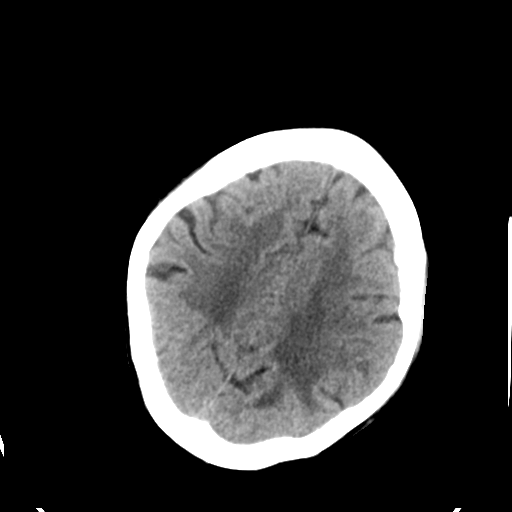

[Series 4: head bone · axial · 0.43mm/px · z∈[-109,+9]mm · 8 of 75 slices shown]
[im 8/75  bone]
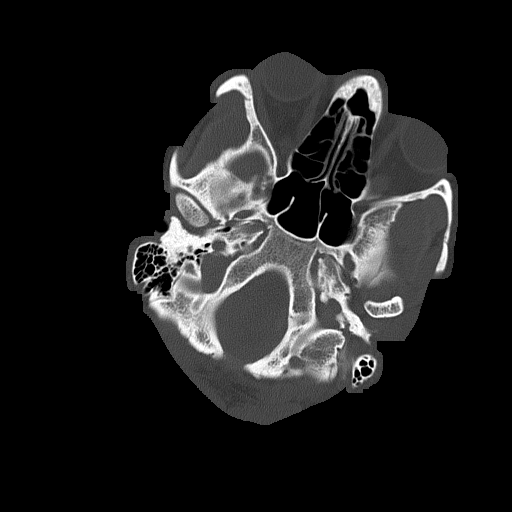
[im 15/75  bone]
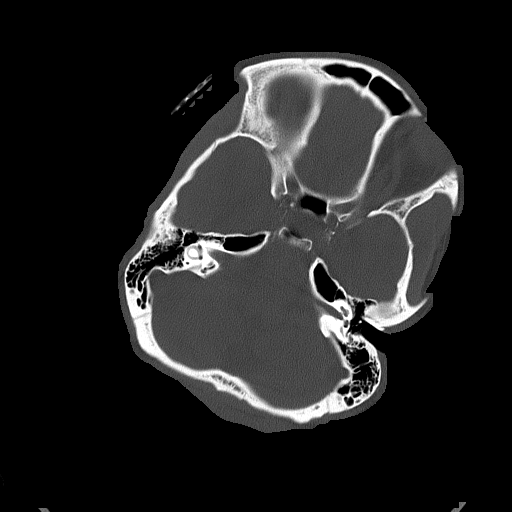
[im 23/75  bone]
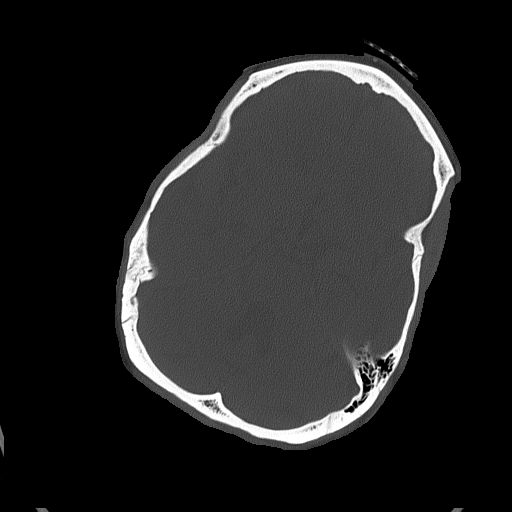
[im 30/75  bone]
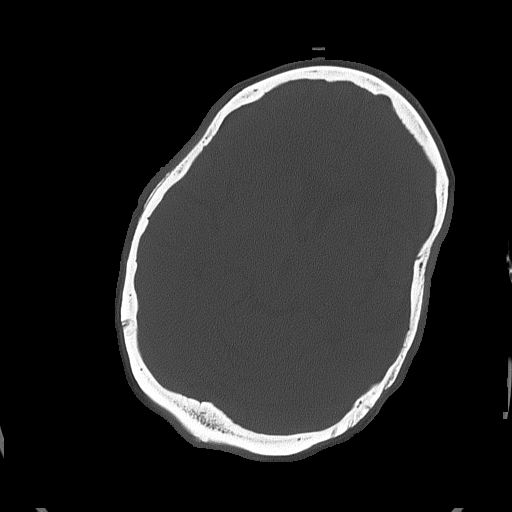
[im 45/75  bone]
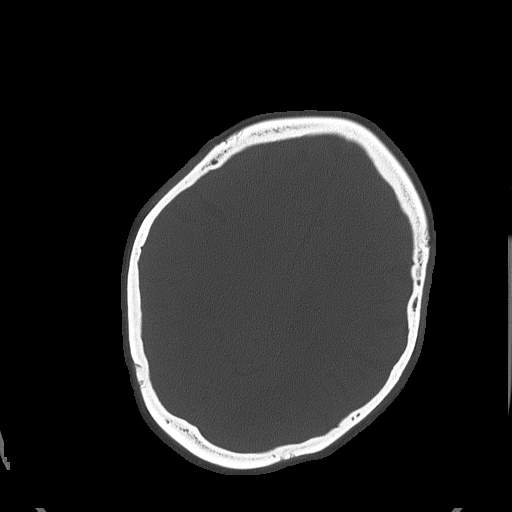
[im 52/75  bone]
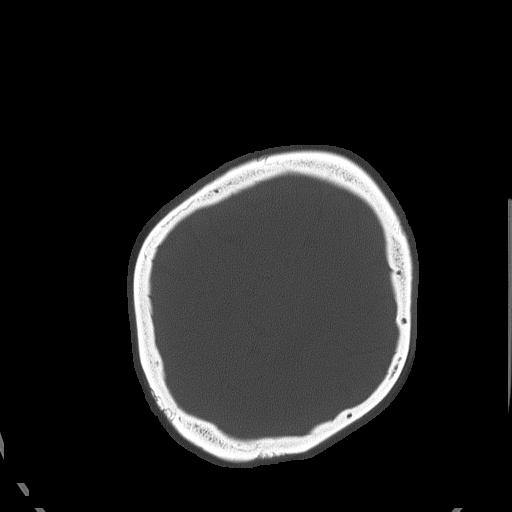
[im 60/75  bone]
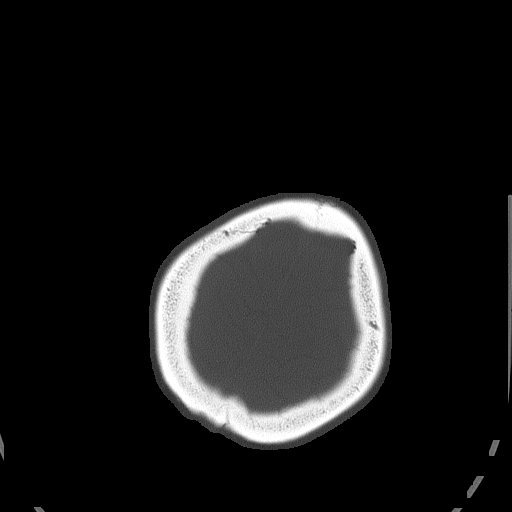
[im 67/75  bone]
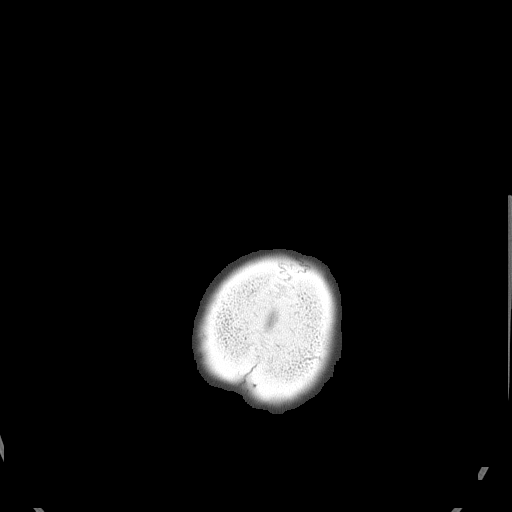

[Series 6: head without cor · coronal · non-contrast · 0.35mm/px · 3 of 67 slices shown]
[im 17/67  brain]
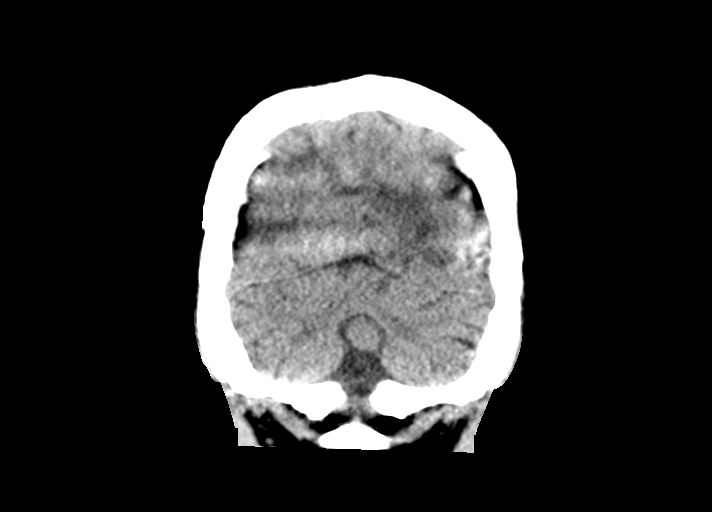
[im 34/67  brain]
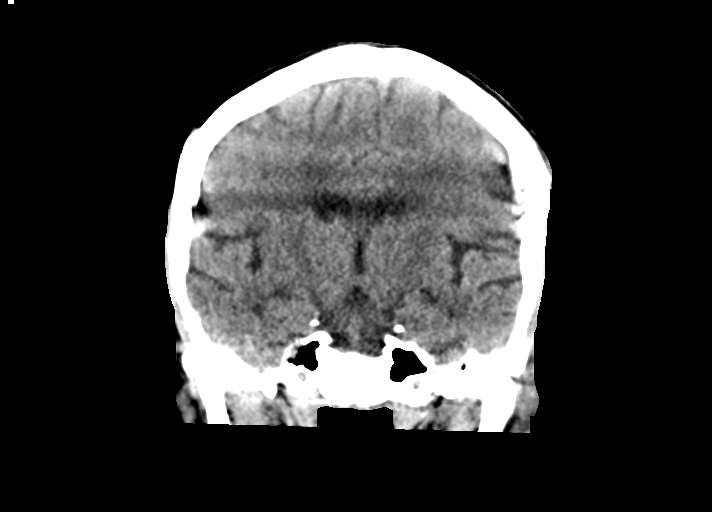
[im 50/67  brain]
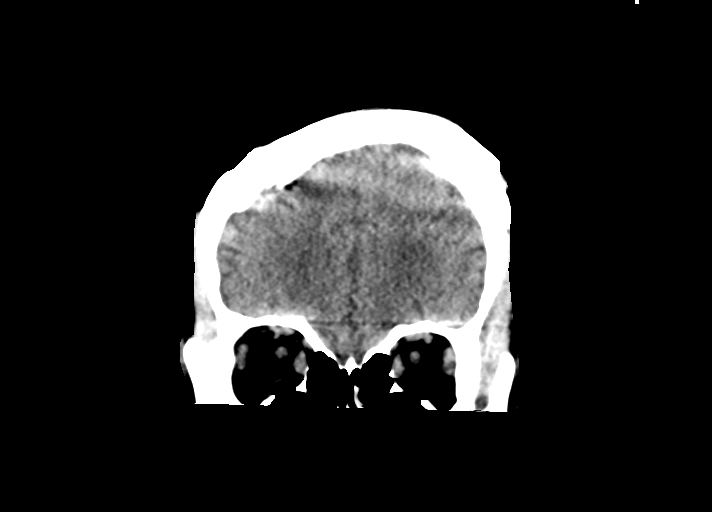

[Series 7: head without sag · sagittal · non-contrast · 0.35mm/px · 2 of 79 slices shown]
[im 27/79  brain]
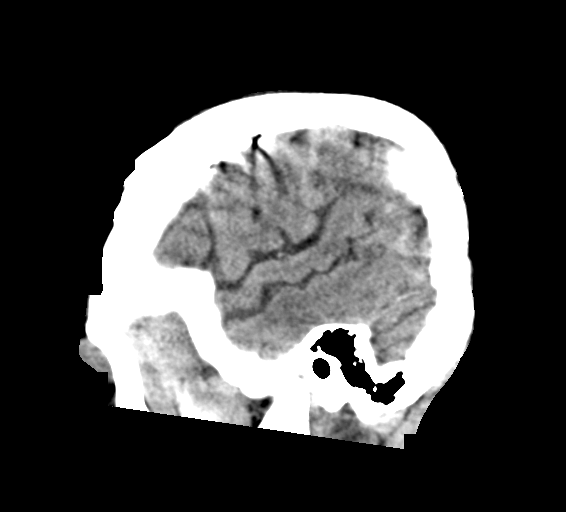
[im 53/79  brain]
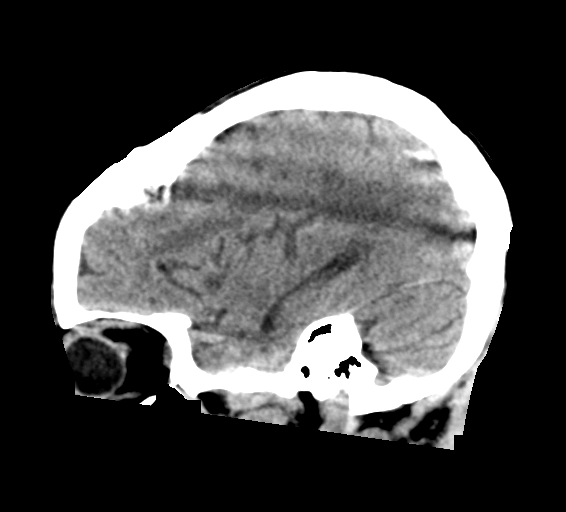

[Series 10: head without · axial · non-contrast · 0.43mm/px · z∈[-78,-28]mm · 2 of 30 slices shown (2 of 2)]
[im 10/30  brain]
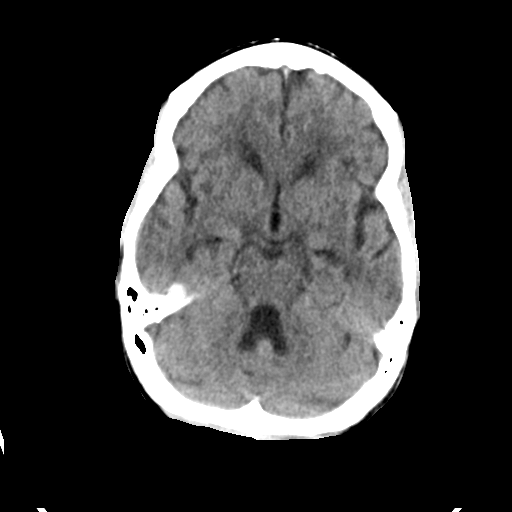
[im 20/30  brain]
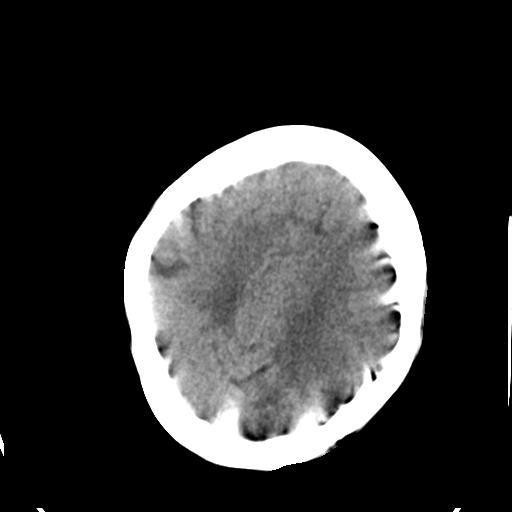

[17 of 47 positions shown; findings below may reference images not displayed]

FINDINGS: Brain: Scan is limited by motion degradation. No evidence of
parenchymal hemorrhage or extra-axial fluid collection. No mass
lesion, mass effect, or midline shift. No CT evidence of acute
infarction. Nonspecific moderate subcortical and periventricular
white matter hypodensity. No ventriculomegaly.

Vascular: No acute abnormality.

Skull: No evidence of calvarial fracture.

Sinuses/Orbits: The visualized paranasal sinuses are essentially
clear.

Other:  The mastoid air cells are unopacified.
IMPRESSION: 1. Limited motion degraded scan. No evidence of acute intracranial
abnormality.
2. Nonspecific moderate periventricular and subcortical white matter
hypodensity in the cerebral white matter. While potentially
representing age advanced chronic small vessel ischmic changes,
other etiologies including a demyelinating disorder cannot be
excluded given patient's young age. Consider brain MRI for further
evaluation.

## 2021-01-15 MED ORDER — POTASSIUM CHLORIDE CRYS ER 20 MEQ PO TBCR
40.0000 meq | EXTENDED_RELEASE_TABLET | Freq: Two times a day (BID) | ORAL | Status: DC
  Administered 2021-01-15 (×2): 40 meq via ORAL
  Filled 2021-01-15 (×2): qty 2

## 2021-01-15 MED ORDER — METOPROLOL TARTRATE 50 MG PO TABS
50.0000 mg | ORAL_TABLET | Freq: Three times a day (TID) | ORAL | Status: DC
  Administered 2021-01-15 – 2021-01-18 (×9): 50 mg via ORAL
  Filled 2021-01-15 (×10): qty 1

## 2021-01-15 MED ORDER — MAGNESIUM SULFATE 2 GM/50ML IV SOLN
2.0000 g | Freq: Once | INTRAVENOUS | Status: AC
  Administered 2021-01-15: 2 g via INTRAVENOUS
  Filled 2021-01-15: qty 50

## 2021-01-15 NOTE — TOC Progression Note (Signed)
Transition of Care [facility]) - Progression Note    Patient Details  Name: Holly Bradley MRN: [id number] Date of Birth: 08/19/1976  [facility] CM/SW Contact  Zenon Mayo, RN Phone Number: 01/15/2021, 5:57 PM  Clinical Narrative:    From home with brother, COVID positive, increased lasix medication, TOC to continue to follow.         Expected Discharge Plan and Services                                                 Social Determinants of Health (SDOH) Interventions    Readmission Risk Interventions No flowsheet data found.

## 2021-01-15 NOTE — Progress Notes (Signed)
Progress Note  [name] Date of Encounter: 01/15/2021  [facility] Cardiologist: Glenetta Hew, MD  Subjective   No CV complaints. In last 24 hours, had 2 episodes of nonsustained wide-complex tachycardia, 25-30 beats. One has retrograde P waves, the other clear AV dissociation. Different QRS morphologies. Both are clearly VT, not SVT. BP normal to slightly elevated. Net diuresis 3.6 liters since admission. Improving renal parameters with diuresis. Weight down to 131 lb. Hgb stable at 7.6, microcytic.  Inpatient Medications    Scheduled Meds: . enoxaparin (LOVENOX) injection  40 mg Subcutaneous Q24H  . furosemide  80 mg Intravenous BID  . isosorbide-hydrALAZINE  2 tablet Oral TID  . metoprolol tartrate  37.5 mg Oral TID  . pantoprazole  40 mg Oral Daily  . potassium chloride  40 mEq Oral BID   Continuous Infusions: . magnesium sulfate bolus IVPB 2 g (01/15/21 0852)   PRN Meds: acetaminophen, hydrALAZINE, lip balm   Vital Signs    Vitals:   01/14/21 2035 01/15/21 0000 01/15/21 0519 01/15/21 0753  BP: 133/76 121/64 (!) 142/88 (!) 151/88  Pulse: 89 76 89 95  Resp: 17 16 13 15   Temp: 97.6 F (36.4 C) 97.6 F (36.4 C) 97.9 F (36.6 C) (!) 97.5 F (36.4 C)  TempSrc: Oral Oral Oral Oral  SpO2: 99% 99% 98% 93%  Weight:   59.4 kg   Height:        Intake/Output Summary (Last 24 hours) at 01/15/2021 0919 Last data filed at 01/15/2021 0528 Gross per 24 hour  Intake -  Output 3900 ml  Net -3900 ml   Last 3 Weights 01/15/2021 01/14/2021 01/13/2021  Weight (lbs) 131 lb 134 lb 9.6 oz 133 lb 4.8 oz  Weight (kg) 59.421 kg 61.054 kg 60.464 kg      Telemetry    VT 25-30 beats, twice. - Personally Reviewed      ECG    ECG 01/20 SR w rare PVC, LA enlargement, inferolateral ST-T changes; ECG 01/21 at 0935h is SVT, probably AV node reentry with retrograde P wave on tail of S wave  - Personally Reviewed  Physical Exam  Acutely ill GEN: No  acute distress.   Neck: No JVD Cardiac: RRR, no murmurs, rubs, or gallops.  Respiratory: Clear to auscultation bilaterally. GI: Soft, nontender, non-distended , firm suprapubic mass MS: No edema; No deformity. Neuro:  Nonfocal  Psych: Normal affect   Labs    High Sensitivity Troponin:   Recent Labs  Lab 01/10/21 1449 01/10/21 1600 01/10/21 1927  TROPONINIHS 802* 922* 888*      Chemistry Recent Labs  Lab 01/10/21 1600 01/10/21 1641 01/13/21 0427 01/14/21 0344 01/15/21 0350  NA  --    < > 144 139 135  K  --    < > 3.1* 3.6 3.6  CL  --    < > 105 103 100  CO2  --    < > 22 24 23   GLUCOSE  --    < > 101* 97 93  BUN  --    < > 51* 47* 38*  CREATININE  --    < > 1.53* 1.34* 1.26*  CALCIUM  --    < > 9.9 9.5 9.0  PROT 6.4*  --   --   --   --   ALBUMIN 3.1*  --   --   --   --   AST 52*  --   --   --   --  ALT 34  --   --   --   --   ALKPHOS 61  --   --   --   --   BILITOT 2.8*  --   --   --   --   GFRNONAA  --    < > 42* 50* 53*  ANIONGAP  --    < > 17* 12 12   < > = values in this interval not displayed.     Hematology Recent Labs  Lab 01/13/21 0427 01/14/21 0344 01/15/21 0350  WBC 6.2 6.1 7.6  RBC 4.76 4.37 4.39  HGB 8.2* 7.6* 7.6*  HCT 30.6* 28.2* 28.0*  MCV 64.3* 64.5* 63.8*  MCH 17.2* 17.4* 17.3*  MCHC 26.8* 27.0* 27.1*  RDW 19.9* 20.2* 20.3*  PLT 170 145* 163    BNP Recent Labs  Lab 01/10/21 1054  BNP >4,500.0*     DDimer  Recent Labs  Lab 01/12/21 0335 01/13/21 0427 01/14/21 0344  DDIMER 2.71* 2.36* 1.92*     Radiology    No results found.  Cardiac Studies   ECHO 01/11/2021  1. Left ventricular ejection fraction, by estimation, is 20 to 25%. The  left ventricle has severely decreased function. The left ventricle  demonstrates global hypokinesis. The left ventricular internal cavity size  was mildly to moderately dilated. There  is moderate concentric left ventricular hypertrophy. Left ventricular  diastolic parameters are  consistent with Grade II diastolic dysfunction  (pseudonormalization). Elevated left ventricular end-diastolic pressure.  The average left ventricular global  longitudinal strain is -5.8 %. The global longitudinal strain is abnormal.  2. Right ventricular systolic function is moderately reduced. The right  ventricular size is normal. There is moderately elevated pulmonary artery  systolic pressure.  3. Left atrial size was mildly dilated.  4. Right atrial size was mildly dilated.  5. Moderate pericardial effusion. The pericardial effusion is  circumferential. There is no evidence of cardiac tamponade.  6. The mitral valve is normal in structure. Mild mitral valve  regurgitation. No evidence of mitral stenosis.  7. The aortic valve is tricuspid. Aortic valve regurgitation is not  visualized. No aortic stenosis is present.  8. The inferior vena cava is dilated in size with <50% respiratory  variability, suggesting right atrial pressure of 15 mmHg.    ECHO 05/02/2020  1. Left ventricular ejection fraction, by estimation, is 40 to 45%. The  left ventricle has mildly decreased function. The left ventricle  demonstrates global hypokinesis. There is mild concentric left ventricular  hypertrophy. Left ventricular diastolic  parameters are consistent with Grade II diastolic dysfunction  (pseudonormalization). Elevated left atrial pressure.  2. Right ventricular systolic function is low normal. The right  ventricular size is normal. There is moderately elevated pulmonary artery  systolic pressure.  3. Left atrial size was mildly dilated.  4. Right atrial size was mildly dilated.  5. Moderate pericardial effusion. The pericardial effusion is  circumferential. There is no evidence of cardiac tamponade.  6. The mitral valve is normal in structure. No evidence of mitral valve  regurgitation.  7. Tricuspid valve regurgitation is mild to moderate.  8. The aortic valve is normal in  structure. Aortic valve regurgitation is  not visualized.  9. The inferior vena cava is dilated in size with <50% respiratory  variability, suggesting right atrial pressure of 15 mmHg.    Patient Profile     45 y.o. female with acute on chronic systolic and diastolic HF with worsening and severely  depressed LVEF, presenting with HF exacerbation and SVT in the setting of COVID-19 infection, anemia and newly diagnosed pelvic mass w bilateral ureteral compression and hydronephrosis.  Assessment & Plan    1. CHF: responding well to diuretics; continue beta blocker and Bidil, start ARB as renal function improves. Entresto considered, but question compliance. Cause of CMP uncertain, but uncontrolled HTN likely at least contributory. 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 Not a candidate for invasive coronary evaluation. 2.  NSVT: SVT on admission, but at least one, probably both of the wide-complex nonsustained tachycardia events in last 24 h is VT (very wide QRS, one with clear AV dissociation). Increase beta blocker as she is now closer to euvolemia. 3. SVT: narrow-complex sustained tachy on arrival, responsive to adenosine, likely AV node reentry. No recurrence in last 24h. 4. HTN: target BP 120/70s due to depressed LVEF. 5. Moderate pericardial effusion: seen on May 2021 echo, unchanged. No signs of tamponade clinically. Workup for inflammatory causes currently confounded by COVID-19 infection. 6. Fe def anemia: labs confirm Fe def - menometrorrhagia (most likely) vs GI etiology. 7. AKI: improving w diuresis.  8. Pelvic mass 9. Paranoid ideation: need to take this into consideration when deciding on long term meds and discussing aggressive therapies such as ICD.     For questions or updates, please contact [place] Please consult [email or web address] for contact info under        Signed, Sanda Klein, MD  01/15/2021, 9:19 AM

## 2021-01-15 NOTE — Progress Notes (Signed)
Occupational Therapy Treatment Patient Details Name: Holly Bradley MRN: [id number] DOB: 1976-08-23 Today's Date: 01/15/2021    History of present illness 45 y.o. female with a hx of chronic systolic and diastolic heart failure, SVT, chronic anemia secondary to uterine fibroids, and HTN who presented with worsening LE edema, weakness and dizziness found to be in SVT.   OT comments  Pt making steady progress towards OT goals this session. Pt seated EOB upon arrival eating breakfast agreeable to OT intervention. Overall, pt requires min guard for household distance functional mobility with no AD, pt completed UB ADLS with MIN A. Feel pts greatest deficits seems to be impaired awareness into deficits as pt states, "I'm not feeble" when asking pt orientation questions and did not mention COVID when asked why pt was admitted. Feel pt may benefit from further cog assessments while admitted to assess for safety managing IADLs, will continue to follow acutely per POC and update DC recs as pt progresses.     Follow Up Recommendations  No OT follow up    Equipment Recommendations  None recommended by OT    Recommendations for Other Services      Precautions / Restrictions Precautions Precautions: Fall;Other (comment) (related to cognition and arrhythmias) Precaution Comments: Watch HR and BP Restrictions Weight Bearing Restrictions: No       Mobility Bed Mobility               General bed mobility comments: pt recieved sitting EOB and returned to EOB  Transfers Overall transfer level: Needs assistance Equipment used: None Transfers: Sit to/from Stand Sit to Stand: Supervision         General transfer comment: pt completed x2 sit<>stands from EOB with supervison for safety    Balance Overall balance assessment: No apparent balance deficits (not formally assessed)                                         ADL either performed or assessed with clinical  judgement   ADL Overall ADL's : Needs assistance/impaired Eating/Feeding: Independent Eating/Feeding Details (indicate cue type and reason): eating breakfast upon arrival             Upper Body Dressing : Minimal assistance;Sitting Upper Body Dressing Details (indicate cue type and reason): to don gown from sitting EOB     Toilet Transfer: Ambulation;Min guard [employer] forces technical officer Details (indicate cue type and reason): min guard assist mostly for lines mgmt,  no AD or overt LOB noted         Functional mobility during ADLs: Min guard General ADL Comments: pt biggest deficits seems to be lack of insight into deficits as pt states, "i'm not feeble" when asking pt orientation questions or asking pt to move around with OTA. unsure of amount of support pt will have at home, per pt she lives with her brother however pt was unable to state how much assist her brother could provide     Vision       Perception     Praxis      Cognition Arousal/Alertness: Awake/alert Behavior During Therapy: WFL for tasks assessed/performed;Agitated (agitated at times when asking pt to demonstrated pts current level of function, but then very appreciative of assist at end of session) Overall Cognitive Status: Impaired/Different from baseline Area of Impairment: Orientation;Safety/judgement  Orientation Level: Time (states "it thursday" but able to problem solve correct day of week with cues)       Safety/Judgement: Decreased awareness of safety;Decreased awareness of deficits     General Comments: pt states, "i'm not feeble" when asked pt to answer orientation questions or ask pt to get OOB with OTA. when asked pt why she is in the hospital pt states "just because of my breathing" no mention of Covid or CHF        Exercises     Shoulder Instructions       General Comments vss, on RA    Pertinent Vitals/ Pain       Pain Assessment: No/denies pain  Home Living                                           Prior Functioning/Environment              Frequency  Min 2X/week        Progress Toward Goals  OT Goals(current goals can now be found in the care plan section)  Progress towards OT goals: Progressing toward goals  Acute Rehab OT Goals Patient Stated Goal: none stated OT Goal Formulation: Patient unable to participate in goal setting Time For Goal Achievement: 01/26/21 Potential to Achieve Goals: [place] Discharge plan remains appropriate;Frequency remains appropriate    Co-evaluation                 AM-PAC OT "6 Clicks" Daily Activity     Outcome Measure   Help from another person eating meals?: None Help from another person taking care of personal grooming?: A Little Help from another person toileting, which includes using toliet, bedpan, or urinal?: A Little Help from another person bathing (including washing, rinsing, drying)?: A Little Help from another person to put on and taking off regular upper body clothing?: A Little Help from another person to put on and taking off regular lower body clothing?: A Little 6 Click Score: 19    End of Session    OT Visit Diagnosis: Unsteadiness on feet (R26.81);Muscle weakness (generalized) (M62.81)   Activity Tolerance Patient tolerated treatment well   Patient Left in bed;with call bell/phone within reach;Other (comment) (sitting EOB, RN entering at end of session)   Nurse Communication Mobility status        Time: 3244-0102 OT Time Calculation (min): 19 min  Charges: OT General Charges $OT Visit: 1 Visit OT Treatments $Self Care/Home Management : 8-22 mins [name], COTA/L Acute Rehabilitation Services [phone]    [name] 01/15/2021, 8:59 AM

## 2021-01-15 NOTE — Progress Notes (Signed)
Subjective:  Overnight, patient noted by cardiology to have two episodes of nonsustained wide-complex tachycardia for 25-30 beats consistent with ventricular tachycardia.  This morning, she states that "things are a mess" but is unable to elaborate further. She is unsure if she talked to her family members yet today but then starts talking vaguely about other subjects. She states her shortness of breath is improved but still not completely resolved. She denies chest pain or abdominal pain. She agrees with our recommendation to have GYN ONC consulted to discuss options for her pelvic mass.  Objective: Vital signs in last 24 hours: Vitals:   01/14/21 2035 01/15/21 0000 01/15/21 0519 01/15/21 0753  BP: 133/76 121/64 (!) 142/88 (!) 151/88  Pulse: 89 76 89 95  Resp: 17 16 13 15   Temp: 97.6 F (36.4 C) 97.6 F (36.4 C) 97.9 F (36.6 C) (!) 97.5 F (36.4 C)  TempSrc: Oral Oral Oral Oral  SpO2: 99% 99% 98% 93%  Weight:   59.4 kg   Height:        Intake/Output Summary (Last 24 hours) at 01/15/2021 1234 Last data filed at 01/15/2021 1100 Gross per 24 hour  Intake --  Output 4100 ml  Net -4100 ml   Filed Weights   01/13/21 0430 01/14/21 0136 01/15/21 0519  Weight: 60.5 kg 61.1 kg 59.4 kg  Physical Exam Vitals and nursing note reviewed.  Constitutional:      General: She is not in acute distress.    Appearance: She is ill-appearing.  Neck:     Comments: No JVD Cardiovascular:     Rate and Rhythm: Normal rate and regular rhythm.     Pulses: Normal pulses.     Heart sounds: Normal heart sounds.  Pulmonary:     Effort: Pulmonary effort is normal. No respiratory distress.     Breath sounds: Normal breath sounds.  Abdominal:     Tenderness: There is no abdominal tenderness.     Comments: Large, round, firm, palpable mass extending from suprapubic region to mid abdomen  Musculoskeletal:     Right lower leg: Edema present.     Left lower leg: Edema present.     Comments: 2+  pitting edema of bilateral lower extremities to mid shin (worse on the left than the right)  Neurological:     General: No focal deficit present.     Mental Status: She is alert. Mental status is at baseline.  Psychiatric:        Mood and Affect: Mood is anxious.        Speech: Speech is tangential.        Thought Content: Thought content is paranoid.   Labs in last 24 hours: CBC Latest Ref Rng & Units 01/15/2021 01/14/2021 01/13/2021  WBC 4.0 - 10.5 K/uL 7.6 6.1 6.2  Hemoglobin 12.0 - 15.0 g/dL 7.6(L) 7.6(L) 8.2(L)  Hematocrit 36.0 - 46.0 % 28.0(L) 28.2(L) 30.6(L)  Platelets 150 - 400 K/uL 163 145(L) 170   BMP Latest Ref Rng & Units 01/15/2021 01/14/2021 01/13/2021  Glucose 70 - 99 mg/dL 93 97 101(H)  BUN 6 - 20 mg/dL 38(H) 47(H) 51(H)  Creatinine 0.44 - 1.00 mg/dL 1.26(H) 1.34(H) 1.53(H)  Sodium 135 - 145 mmol/L 135 139 144  Potassium 3.5 - 5.1 mmol/L 3.6 3.6 3.1(L)  Chloride 98 - 111 mmol/L 100 103 105  CO2 22 - 32 mmol/L 23 24 22   Calcium 8.9 - 10.3 mg/dL 9.0 9.5 9.9  Magnesium - 1.8  Imaging in last  24 hours: No results found.  Assessment/Plan: Active Problems:   Acute exacerbation of CHF (congestive heart failure) (HCC)   Pneumonia due to COVID-19 virus   Acute renal failure (HCC)   Anemia   Pelvic mass in female   Hypertensive emergency   Fibroid   Menorrhagia   Bilateral leg edema  [name] is a 45 year old female with past medical history significant for HTN, combined systolic and diastolic heart failure, paroxysmal SVT, and massive uterine fibroids complicated by menometrorrhagia and chronic blood loss anemia who presented to [facility]21 for evaluation of weaknessfound to be in SVT, acute on chronic biventricular heart failure,hypertensive emergency,AKI, COVID-19 infection and 23cm uterine mass filling peritoneal cavity with compression of venous structures and ureters.  #Biventricular heart failure, active #Moderate pericardial effusion,  chronic #Hypertension, chronic During prior hospitalization in May, patient noted to have EF of 40-45% with grade 2 diastolic dysfunction. Patient was lost to follow-up and nonadherent to prescribed medications following discharge. BNP on admission elevated to >4500 with echo revealing LVEF of 20-25%, moderate RV dysfunction, moderate pulmonary arterial hypertension, moderate pericardial effusion and elevated right atrial pressure. Weight down this morning to 59.4kg from 61.1kg yesterday. Net negative 12.6L since admission with negative 4L in past twenty four hours.  -Cardiology following, greatly appreciate recommendations -Continue furosemide 80mg  IV twice daily  -Continue two tablets of isosorbide-hydralazine 20-37.5mg  three times daily -Increase metoprolol 50mg  TID  -Consider addition or ARB upon resolution of AKI -Strict intake/output -Daily weights  -Address factors contributing to nonadherence   #AKI, improving Baseline cr. of 0.90. Improvement to 1.26 with diuresis consistent with cardiorenal syndrome. Of note, CT Abdomen/Pelvis did reveal mild hydronephrosis and displacement of the urinary bladder which may be contributing to her AKI. Fortunately, patient continues to have appropriate urine output. -Continue furosemide 80mg  IV twice daily -Trend renal function on daily BMP -Avoid nephrotoxins  #Supraventricular tachycardia, recurrent  #Non-sustained ventricular tachycardia, recurrent No recurrent episodes of SVT in past 48 hours, however patient noted to have nonsustained ventricular tachycardia on telemetry in past 24 hours. -Cardiology following, appreciate recommendations -Increase metoprolol 50mg  TID  #Large uterine fibroids, chronic #Iron deficiency anemia 2/2 menometrorrhagia, chronic Patient noted to have 23cm pelvic mass on CT Abdomen/Pelvis with new left hydronephrosis. MRI in 2019 similar with compression of right common iliac artery & vein, likely compression of  IVC and left common iliac vein as well, and bladder. Mass is significantly compressing intraabdominal structures given that it is filling a large portion of the peritoneal cavity. 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 -CT Head WO Contrast  -Continue to monitor, patient may benefit from psychiatry consultation if CT Head unremarkable  #COVID-19 infection, active Patient tested positive for COVID-19 on admission, however she is saturating well on room air. -Airborne and contact precautions  #Hypokalemia, active Potassium of 3.6 this morning. -Potassium chloride 46mEq twice daily  #Hypomagnesemia, active Magnesium of 1.8 on morning labs. -Replete with 2g bolus of magnesium sulfate  #VTE ppx: enoxaparin 40mg  daily #IVF: None #Diet: Heart healthy #Code status: Full code #PT/OT recs: PT has signed off. No OT follow up, no equipment recommendations. #Dispo: Anticipated discharge pending clinical improvement.  Cato Mulligan, MD 01/15/2021, 12:34 PM Pager: [phone] After 5pm on weekdays and 1pm on weekends: On Call Pager: [phone]

## 2021-01-16 ENCOUNTER — Inpatient Hospital Stay (HOSPITAL_COMMUNITY): Payer: BLUE CROSS/BLUE SHIELD

## 2021-01-16 DIAGNOSIS — I5043 Acute on chronic combined systolic (congestive) and diastolic (congestive) heart failure: Secondary | ICD-10-CM | POA: Diagnosis not present

## 2021-01-16 DIAGNOSIS — I5082 Biventricular heart failure: Secondary | ICD-10-CM | POA: Diagnosis not present

## 2021-01-16 DIAGNOSIS — J1282 Pneumonia due to coronavirus disease 2019: Secondary | ICD-10-CM | POA: Diagnosis not present

## 2021-01-16 DIAGNOSIS — U071 COVID-19: Secondary | ICD-10-CM | POA: Diagnosis not present

## 2021-01-16 DIAGNOSIS — N179 Acute kidney failure, unspecified: Secondary | ICD-10-CM | POA: Diagnosis not present

## 2021-01-16 DIAGNOSIS — I471 Supraventricular tachycardia: Secondary | ICD-10-CM | POA: Diagnosis not present

## 2021-01-16 DIAGNOSIS — D259 Leiomyoma of uterus, unspecified: Secondary | ICD-10-CM

## 2021-01-16 LAB — CBC WITH DIFFERENTIAL/PLATELET
Abs Immature Granulocytes: 0.08 10*3/uL — ABNORMAL HIGH (ref 0.00–0.07)
Basophils Absolute: 0 10*3/uL (ref 0.0–0.1)
Basophils Relative: 0 %
Eosinophils Absolute: 0 10*3/uL (ref 0.0–0.5)
Eosinophils Relative: 0 %
HCT: 27.9 % — ABNORMAL LOW (ref 36.0–46.0)
Hemoglobin: 7.4 g/dL — ABNORMAL LOW (ref 12.0–15.0)
Immature Granulocytes: 1 %
Lymphocytes Relative: 4 %
Lymphs Abs: 0.2 10*3/uL — ABNORMAL LOW (ref 0.7–4.0)
MCH: 17.1 pg — ABNORMAL LOW (ref 26.0–34.0)
MCHC: 26.5 g/dL — ABNORMAL LOW (ref 30.0–36.0)
MCV: 64.3 fL — ABNORMAL LOW (ref 80.0–100.0)
Monocytes Absolute: 1 10*3/uL (ref 0.1–1.0)
Monocytes Relative: 17 %
Neutro Abs: 4.7 10*3/uL (ref 1.7–7.7)
Neutrophils Relative %: 78 %
Platelets: 156 10*3/uL (ref 150–400)
RBC: 4.34 MIL/uL (ref 3.87–5.11)
RDW: 20.5 % — ABNORMAL HIGH (ref 11.5–15.5)
WBC: 6 10*3/uL (ref 4.0–10.5)
nRBC: 13.9 % — ABNORMAL HIGH (ref 0.0–0.2)

## 2021-01-16 LAB — BASIC METABOLIC PANEL
Anion gap: 14 (ref 5–15)
BUN: 36 mg/dL — ABNORMAL HIGH (ref 6–20)
CO2: 26 mmol/L (ref 22–32)
Calcium: 8.8 mg/dL — ABNORMAL LOW (ref 8.9–10.3)
Chloride: 95 mmol/L — ABNORMAL LOW (ref 98–111)
Creatinine, Ser: 1.05 mg/dL — ABNORMAL HIGH (ref 0.44–1.00)
GFR, Estimated: >60 mL/min (ref >60)
Glucose, Bld: 79 mg/dL (ref 70–99)
Potassium: 3.2 mmol/L — ABNORMAL LOW (ref 3.5–5.1)
Sodium: 135 mmol/L (ref 135–145)

## 2021-01-16 LAB — MAGNESIUM: Magnesium: 1.8 mg/dL (ref 1.7–2.4)

## 2021-01-16 IMAGING — MR MR HEAD WO/W CM
14 of 20 series · 31 of 48 positions shown · IV contrast (Gadavist)
Comparison: Head CT [DATE].

CLINICAL DATA: 44-year-old female with altered mental status,
dizziness. Positive [9A].

EXAM:
MRI HEAD WITHOUT AND WITH CONTRAST
TECHNIQUE: Multiplanar, multiecho pulse sequences of the brain and surrounding
structures were obtained without and with intravenous contrast.
CONTRAST:  6mL GADAVIST GADOBUTROL 1 MMOL/ML IV SOLN

[Series 5: DWI · axial · 3.0mm · 0.88mm/px · z∈[-117,+18]mm · 5 of 96 slices shown (1 of 4)]
[im 1/96]
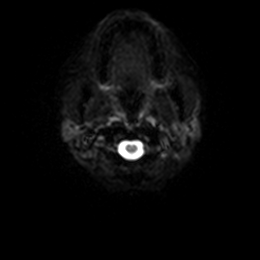
[im 24/96]
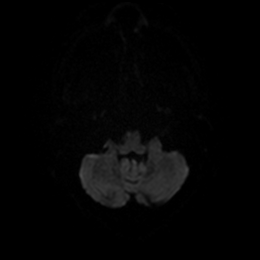
[im 48/96]
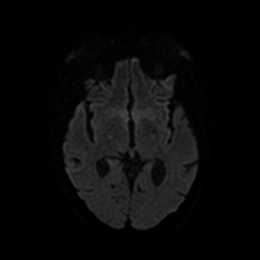
[im 72/96]
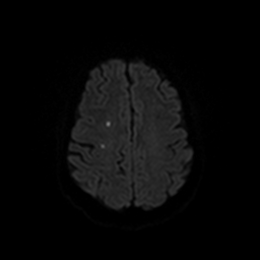
[im 96/96]
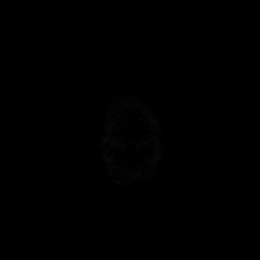

[Series 6: DWI · axial · 3.0mm · 0.88mm/px · z∈[-117,+18]mm · 2 of 48 slices shown (2 of 4)]
[im 1/48]
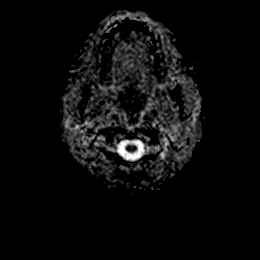
[im 48/48]
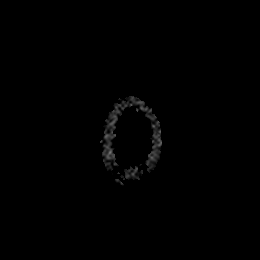

[Series 7: DWI · coronal · 4.0mm · 0.88mm/px · 3 of 70 slices shown (3 of 4)]
[im 1/70]
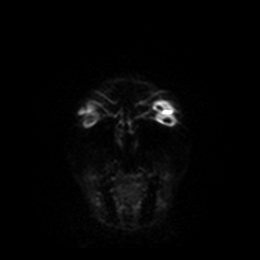
[im 35/70]
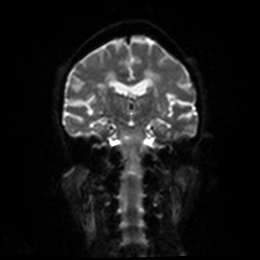
[im 70/70]
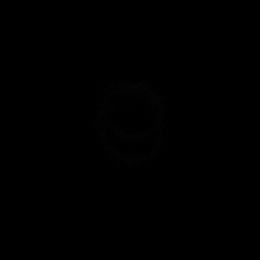

[Series 8: DWI · coronal · 4.0mm · 0.88mm/px · 2 of 35 slices shown (4 of 4)]
[im 1/35]
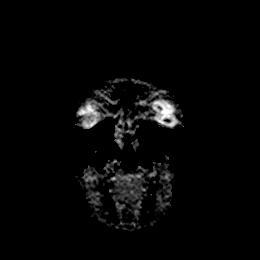
[im 35/35]
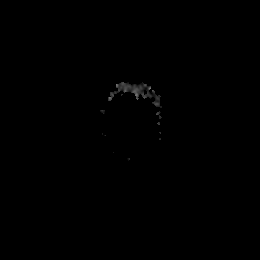

[Series 9: T1 · sagittal · 5.0mm · 0.75mm/px · 1 of 23 slices shown]
[im 1/23]
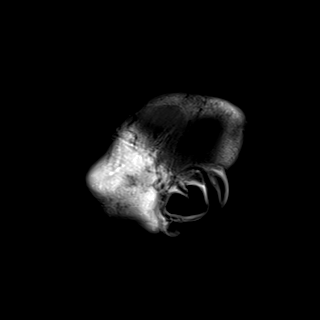

[Series 10: T2 · axial · 5.0mm · 0.72mm/px · 1 of 25 slices shown]
[im 1/25]
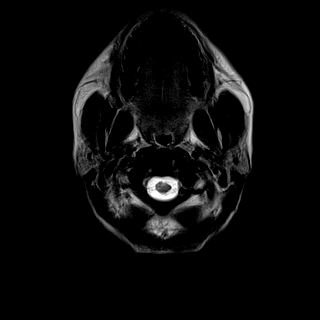

[Series 21: FLAIR · axial · 5.0mm · 0.45mm/px · 1 of 25 slices shown]
[im 1/25]
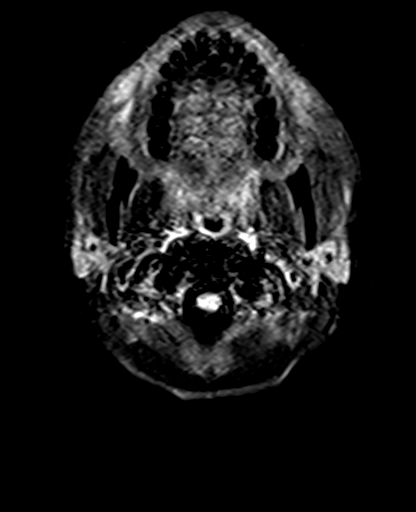

[Series 22: mag_images · axial · 3.0mm · 0.90mm/px · z∈[-120,+12]mm · 3 of 48 slices shown]
[im 1/48]
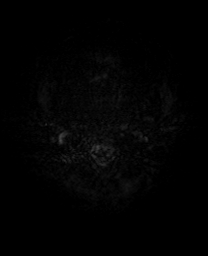
[im 24/48]
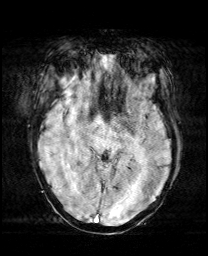
[im 48/48]
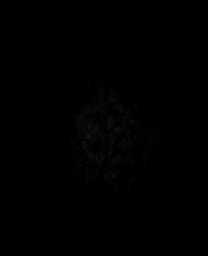

[Series 23: pha_images · axial · 3.0mm · 0.90mm/px · z∈[-120,+6]mm · 3 of 46 slices shown]
[im 1/46]
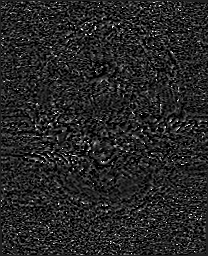
[im 23/46]
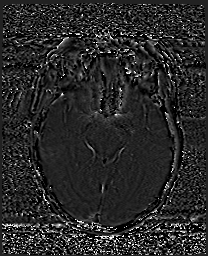
[im 46/46]
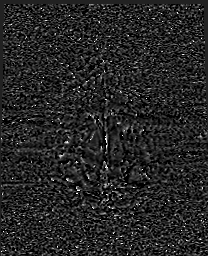

[Series 24: swi_images · axial · 3.0mm · 0.90mm/px · z∈[-120,+12]mm · 3 of 48 slices shown]
[im 1/48]
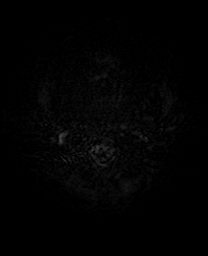
[im 24/48]
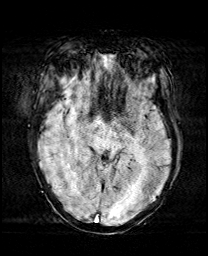
[im 48/48]
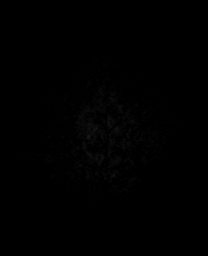

[Series 25: mip_images(sw) · axial · 24.0mm · 0.90mm/px · z∈[-110,+2]mm · 2 of 41 slices shown]
[im 1/41]
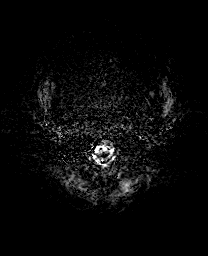
[im 41/41]
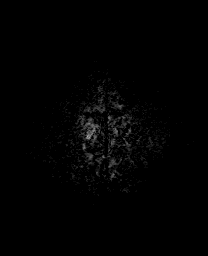

[Series 26: T2 post-contrast · coronal · 5.0mm · 0.72mm/px · 2 of 28 slices shown]
[im 1/28]
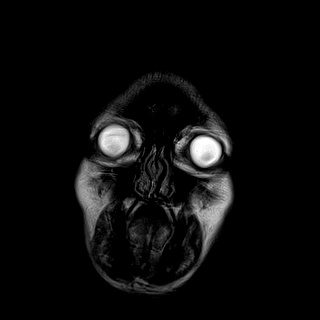
[im 28/28]
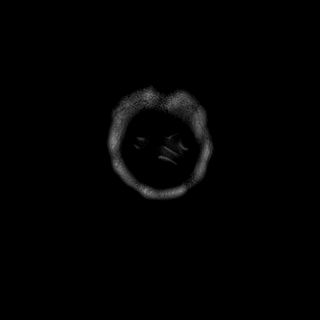

[Series 28: T1 post-contrast · coronal · 5.0mm · 0.34mm/px · 2 of 28 slices shown (1 of 2)]
[im 1/28]
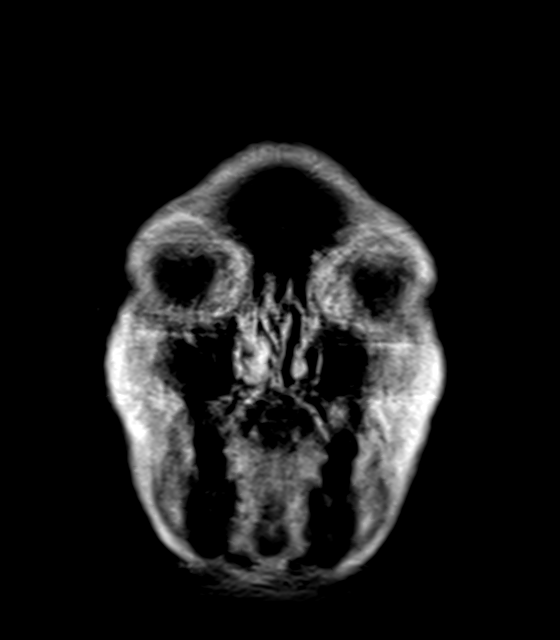
[im 28/28]
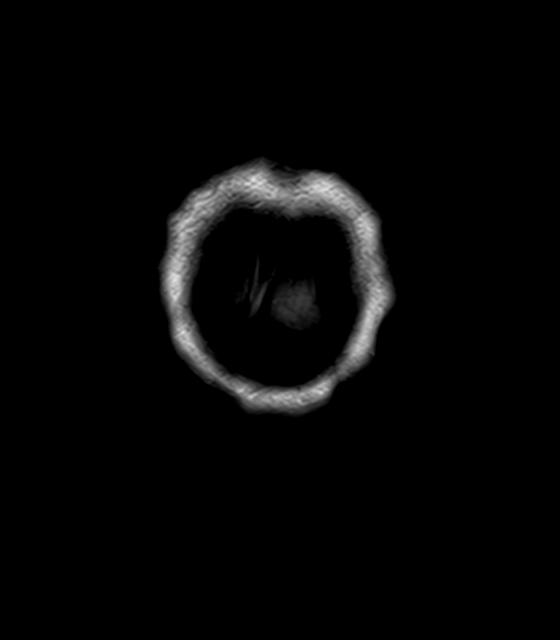

[Series 29: T1 post-contrast · sagittal · 5.0mm · 0.72mm/px · 1 of 23 slices shown (2 of 2)]
[im 1/23]
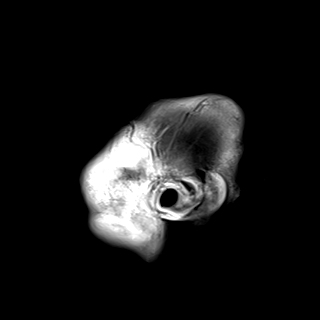

[31 of 48 positions shown; findings below may reference images not displayed]

FINDINGS: Study is mildly degraded by motion artifact despite repeated imaging
attempts.

Brain: There are 5 or 6 small curvilinear foci of acute restricted
diffusion in the right frontal lobe white matter (such as series 5,
image 84). A single focus involves the right parietal lobe white
matter near the atrium of the lateral ventricle (image 78).

Solitary similar focus in the right superior cerebellum (series 5,
image 61).

And additionally, there are a similar small number of subacute
appearing white matter foci of restriction, such as left corona
radiata series 5, image 82 and right occipital lobe image 70.

Superimposed confluent bilateral cerebral white matter T2 and FLAIR
hyperintensity with moderate to severe heterogeneity in the
bilateral basal ganglia most compatible with chronic lacunar
infarcts. Relative sparing of the thalamus, although multiple
chronic pontine lacunar infarcts are noted greater on the right
(series 6, image 16).

SWI mildly degraded but a few scattered chronic microhemorrhages are
suspected in the brain.

No definite cortical encephalomalacia. No superimposed midline
shift, mass effect, evidence of mass lesion, ventriculomegaly,
extra-axial collection or acute intracranial hemorrhage.
Cervicomedullary junction and pituitary are within normal limits.

Intrinsic T1 hyperintensity on precontrast images suggesting recent
iron administration. No definite abnormal enhancement or dural
thickening.

Vascular: Major intracranial vascular flow voids are preserved.

Pre contrast intrinsic T1 hyperintensity of the major vascular
structures such as with recent intravenous iron.

Skull and upper cervical spine: Generalized dark T1 marrow signal
such as due to hemo siderosis. No suspicious marrow lesion.

Sinuses/Orbits: Negative.

Other: Mastoids are clear. Grossly negative face and scalp soft
tissues.
IMPRESSION: 1. Very severe for age chronic small vessel disease in the brain
with multiple small acute and subacute cerebral white matter
infarcts, mostly in the right hemisphere.
Similar small acute right superior cerebellar lacunar infarct.
No associated acute hemorrhage or mass effect.

2. Intrinsic T1 signal prior to contrast suggesting recent
intravenous iron administration. No definite abnormal enhancement.

3. Diffusely abnormal marrow signal suggesting hemosiderosis.

## 2021-01-16 MED ORDER — FUROSEMIDE 80 MG PO TABS
80.0000 mg | ORAL_TABLET | Freq: Every day | ORAL | Status: DC
  Administered 2021-01-16 – 2021-01-19 (×4): 80 mg via ORAL
  Filled 2021-01-16 (×4): qty 1

## 2021-01-16 MED ORDER — MAGNESIUM SULFATE 2 GM/50ML IV SOLN
2.0000 g | Freq: Once | INTRAVENOUS | Status: AC
  Administered 2021-01-16: 2 g via INTRAVENOUS
  Filled 2021-01-16: qty 50

## 2021-01-16 MED ORDER — GADOBUTROL 1 MMOL/ML IV SOLN
6.0000 mL | Freq: Once | INTRAVENOUS | Status: AC | PRN
  Administered 2021-01-16: 6 mL via INTRAVENOUS

## 2021-01-16 MED ORDER — POTASSIUM CHLORIDE CRYS ER 20 MEQ PO TBCR
40.0000 meq | EXTENDED_RELEASE_TABLET | Freq: Three times a day (TID) | ORAL | Status: DC
  Administered 2021-01-16: 40 meq via ORAL
  Filled 2021-01-16 (×2): qty 2

## 2021-01-16 MED ORDER — SPIRONOLACTONE 25 MG PO TABS
25.0000 mg | ORAL_TABLET | Freq: Every day | ORAL | Status: DC
  Administered 2021-01-16 – 2021-01-17 (×2): 25 mg via ORAL
  Filled 2021-01-16 (×2): qty 1

## 2021-01-16 MED ORDER — POTASSIUM CHLORIDE CRYS ER 20 MEQ PO TBCR
40.0000 meq | EXTENDED_RELEASE_TABLET | Freq: Two times a day (BID) | ORAL | Status: DC
  Administered 2021-01-16 – 2021-01-17 (×2): 40 meq via ORAL
  Filled 2021-01-16 (×2): qty 2

## 2021-01-16 NOTE — Progress Notes (Signed)
Progress Note  [name] Date of Encounter: 01/16/2021  [facility] Cardiologist: Glenetta Hew, MD   Subjective   Another 24 h of excellent diuresis, net 10 liters negative since admission, weight not recorded today. Creatinine further improved with diuresis. BP still relatively high this AM. Edema "almost gone".  Inpatient Medications    Scheduled Meds: . enoxaparin (LOVENOX) injection  40 mg Subcutaneous Q24H  . furosemide  80 mg Intravenous BID  . isosorbide-hydrALAZINE  2 tablet Oral TID  . metoprolol tartrate  50 mg Oral TID  . pantoprazole  40 mg Oral Daily  . potassium chloride  40 mEq Oral TID   Continuous Infusions:  PRN Meds: acetaminophen, lip balm   Vital Signs    Vitals:   01/15/21 2000 01/15/21 2200 01/16/21 0500 01/16/21 0600  BP: 112/67 113/63  (!) 155/85  Pulse: 88 90  95  Resp: 20 13  19   Temp: 98.3 F (36.8 C) 98 F (36.7 C) 98.3 F (36.8 C) 98.3 F (36.8 C)  TempSrc: Oral Oral  Oral  SpO2: 98% 98%  98%  Weight:      Height:        Intake/Output Summary (Last 24 hours) at 01/16/2021 0832 Last data filed at 01/16/2021 0500 Gross per 24 hour  Intake 720 ml  Output 1550 ml  Net -830 ml   Last 3 Weights 01/15/2021 01/14/2021 01/13/2021  Weight (lbs) 131 lb 134 lb 9.6 oz 133 lb 4.8 oz  Weight (kg) 59.421 kg 61.054 kg 60.464 kg      Telemetry    SR, 2 brief episodes of NSVT <10 beats, no SVT seen - Personally Reviewed  ECG    No new tracing - Personally Reviewed  Physical Exam  Comfortable lying flat GEN: No acute distress.   Neck: No JVD Cardiac: RRR, no murmurs, rubs, or gallops.  Respiratory: Clear to auscultation bilaterally. GI: Soft, nontender, non-distended  MS: trace edema; No deformity. Neuro:  Nonfocal  Psych: speech is very tangential, but she appears relaxed  Labs    High Sensitivity Troponin:   Recent Labs  Lab 01/10/21 1449 01/10/21 1600 01/10/21 1927  TROPONINIHS 802* 922* 888*       Chemistry Recent Labs  Lab 01/10/21 1600 01/10/21 1641 01/14/21 0344 01/15/21 0350 01/16/21 0608  NA  --    < > 139 135 135  K  --    < > 3.6 3.6 3.2*  CL  --    < > 103 100 95*  CO2  --    < > 24 23 26   GLUCOSE  --    < > 97 93 79  BUN  --    < > 47* 38* 36*  CREATININE  --    < > 1.34* 1.26* 1.05*  CALCIUM  --    < > 9.5 9.0 8.8*  PROT 6.4*  --   --   --   --   ALBUMIN 3.1*  --   --   --   --   AST 52*  --   --   --   --   ALT 34  --   --   --   --   ALKPHOS 61  --   --   --   --   BILITOT 2.8*  --   --   --   --   GFRNONAA  --    < > 50* 53* >60  ANIONGAP  --    < >  12 12 14    < > = values in this interval not displayed.     Hematology Recent Labs  Lab 01/13/21 0427 01/14/21 0344 01/15/21 0350  WBC 6.2 6.1 7.6  RBC 4.76 4.37 4.39  HGB 8.2* 7.6* 7.6*  HCT 30.6* 28.2* 28.0*  MCV 64.3* 64.5* 63.8*  MCH 17.2* 17.4* 17.3*  MCHC 26.8* 27.0* 27.1*  RDW 19.9* 20.2* 20.3*  PLT 170 145* 163    BNP Recent Labs  Lab 01/10/21 1054  BNP >4,500.0*     DDimer  Recent Labs  Lab 01/12/21 0335 01/13/21 0427 01/14/21 0344  DDIMER 2.71* 2.36* 1.92*     Radiology    CT HEAD WO CONTRAST  Result Date: 01/15/2021 CLINICAL DATA:  Inpatient. COVID positive. Mental status change with worsening confusion. No reported injury. EXAM: CT HEAD WITHOUT CONTRAST TECHNIQUE: Contiguous axial images were obtained from the base of the skull through the vertex without intravenous contrast. COMPARISON:  None. FINDINGS: Brain: Scan is limited by motion degradation. No evidence of parenchymal hemorrhage or extra-axial fluid collection. No mass lesion, mass effect, or midline shift. No CT evidence of acute infarction. Nonspecific moderate subcortical and periventricular white matter hypodensity. No ventriculomegaly. Vascular: No acute abnormality. Skull: No evidence of calvarial fracture. Sinuses/Orbits: The visualized paranasal sinuses are essentially clear. Other:  The mastoid air cells  are unopacified. IMPRESSION: 1. Limited motion degraded scan. No evidence of acute intracranial abnormality. 2. Nonspecific moderate periventricular and subcortical white matter hypodensity in the cerebral white matter. While potentially representing age advanced chronic small vessel ischmic changes, other etiologies including a demyelinating disorder cannot be excluded given patient's young age. Consider brain MRI for further evaluation. Electronically Signed   By: Ilona Sorrel M.D.   On: 01/15/2021 16:29   MR BRAIN W WO CONTRAST  Result Date: 01/16/2021 CLINICAL DATA:  45 year old female with altered mental status, dizziness. Positive COVID-19. EXAM: MRI HEAD WITHOUT AND WITH CONTRAST TECHNIQUE: Multiplanar, multiecho pulse sequences of the brain and surrounding structures were obtained without and with intravenous contrast. CONTRAST:  36mL GADAVIST GADOBUTROL 1 MMOL/ML IV SOLN COMPARISON:  Head CT 01/15/2021. FINDINGS: Study is mildly degraded by motion artifact despite repeated imaging attempts. Brain: There are 5 or 6 small curvilinear foci of acute restricted diffusion in the right frontal lobe white matter (such as series 5, image 84). A single focus involves the right parietal lobe white matter near the atrium of the lateral ventricle (image 78). Solitary similar focus in the right superior cerebellum (series 5, image 61). And additionally, there are a similar small number of subacute appearing white matter foci of restriction, such as left corona radiata series 5, image 82 and right occipital lobe image 70. Superimposed confluent bilateral cerebral white matter T2 and FLAIR hyperintensity with moderate to severe heterogeneity in the bilateral basal ganglia most compatible with chronic lacunar infarcts. Relative sparing of the thalamus, although multiple chronic pontine lacunar infarcts are noted greater on the right (series 6, image 16). SWI mildly degraded but a few scattered chronic microhemorrhages  are suspected in the brain. No definite cortical encephalomalacia. No superimposed midline shift, mass effect, evidence of mass lesion, ventriculomegaly, extra-axial collection or acute intracranial hemorrhage. Cervicomedullary junction and pituitary are within normal limits. Intrinsic T1 hyperintensity on precontrast images suggesting recent iron administration. No definite abnormal enhancement or dural thickening. Vascular: Major intracranial vascular flow voids are preserved. Pre contrast intrinsic T1 hyperintensity of the major vascular structures such as with recent intravenous iron. Skull and upper cervical  spine: Generalized dark T1 marrow signal such as due to hemo siderosis. No suspicious marrow lesion. Sinuses/Orbits: Negative. Other: Mastoids are clear. Grossly negative face and scalp soft tissues. IMPRESSION: 1. 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 Similar small acute right superior cerebellar lacunar infarct. No associated acute hemorrhage or mass effect. 2. Intrinsic T1 signal prior to contrast suggesting recent intravenous iron administration. No definite abnormal enhancement. 3. Diffusely abnormal marrow signal suggesting hemosiderosis. Electronically Signed   By: Genevie Ann M.D.   On: 01/16/2021 05:43    Cardiac Studies   ECHO 01/11/2021  1. Left ventricular ejection fraction, by estimation, is 20 to 25%. The  left ventricle has severely decreased function. The left ventricle  demonstrates global hypokinesis. The left ventricular internal cavity size  was mildly to moderately dilated. There  is moderate concentric left ventricular hypertrophy. Left ventricular  diastolic parameters are consistent with Grade II diastolic dysfunction  (pseudonormalization). Elevated left ventricular end-diastolic pressure.  The average left ventricular global  longitudinal  strain is -5.8 %. The global longitudinal strain is abnormal.  2. Right ventricular systolic function is moderately reduced. The right  ventricular size is normal. There is moderately elevated pulmonary artery  systolic pressure.  3. Left atrial size was mildly dilated.  4. Right atrial size was mildly dilated.  5. Moderate pericardial effusion. The pericardial effusion is  circumferential. There is no evidence of cardiac tamponade.  6. The mitral valve is normal in structure. Mild mitral valve  regurgitation. No evidence of mitral stenosis.  7. The aortic valve is tricuspid. Aortic valve regurgitation is not  visualized. No aortic stenosis is present.  8. The inferior vena cava is dilated in size with <50% respiratory  variability, suggesting right atrial pressure of 15 mmHg.    ECHO 05/02/2020  1. Left ventricular ejection fraction, by estimation, is 40 to 45%. The  left ventricle has mildly decreased function. The left ventricle  demonstrates global hypokinesis. There is mild concentric left ventricular  hypertrophy. Left ventricular diastolic  parameters are consistent with Grade II diastolic dysfunction  (pseudonormalization). Elevated left atrial pressure.  2. Right ventricular systolic function is low normal. The right  ventricular size is normal. There is moderately elevated pulmonary artery  systolic pressure.  3. Left atrial size was mildly dilated.  4. Right atrial size was mildly dilated.  5. Moderate pericardial effusion. The pericardial effusion is  circumferential. There is no evidence of cardiac tamponade.  6. The mitral valve is normal in structure. No evidence of mitral valve  regurgitation.  7. Tricuspid valve regurgitation is mild to moderate.  8. The aortic valve is normal in structure. Aortic valve regurgitation is  not visualized.  9. The inferior vena cava is dilated in size with <50% respiratory  variability, suggesting right atrial pressure of 15  mmHg  Patient Profile     45 y.o. female with acute on chronic systolic and diastolic HF with worsening and severely depressed LVEF, presenting with HF exacerbation and SVT in the setting of COVID-19 infection, anemia and newly diagnosed pelvic mass w bilateral ureteral compression and hydronephrosis.  Assessment & Plan     1. CHF: responding well to diuretics; continue beta blocker and Bidil. Will start spironolactone to help w hypokalemia, then can start ARB as renal function and BP allows. Entresto considered, but question compliance. Cause of CMP uncertain, but uncontrolled HTN likely  at least contributory. Suspect nonischemic etiology. Not a candidate for invasive coronary evaluation. 2.  NSVT: occasional, asymptomatic. Correct K. 3. SVT: narrow-complex sustained tachy on arrival, responsive to adenosine, likely AV node reentry. No recurrence in last 48h. 4. HTN: target BP 120/70s due to depressed LVEF. 5. Moderate pericardial effusion: seen on May 2021 echo, unchanged. No signs of tamponade clinically. Workup for inflammatory causes currently confounded by COVID-19 infection. 6. Fe def anemia: labs confirm Fe def - menometrorrhagia (most likely) vs GI etiology. 7. AKI: improving w diuresis.  8. Pelvic mass 9. Paranoid ideation: need to take this into consideration when deciding on long term meds and discussing aggressive therapies such as ICD.     For questions or updates, please contact [place] Please consult [email or web address] for contact info under        Signed, Sanda Klein, MD  01/16/2021, 8:32 AM

## 2021-01-16 NOTE — Progress Notes (Signed)
Heart Failure Nurse Navigator Progress Note  PCP: Patient, No Pcp Per PCP-Cardiologist: Glenetta Hew, MD Admission Diagnosis: acute exacerbation of CHF complicated by COVID + Admitted from: Home with roommates (apartment)  Presentation:   [name] presented with [facility], weakness, palpitations and nausea. PMI for pSVT--pt canceled/no showed for several appts with EP team regarding ablation. ECHO performed this admission with significant decrease in LVEF (5/21 40-45%).   Spoke with patient via room phone, difficult conversation as patient replies to questions with "you know" and very defensive vague answers. Pt feels she is doing well without assistance because "some people needs help and some people don't". Explained to Ms. Stare my goal as Navigator is to bridge the gap between inpt/outpt and decrease barriers to increase QOL outside of the hospital setting. Pt unable to state if she has transportation to appointments, how she receives her medication (pick up from pharmacy, mail order, etc), if she is taking meds as prescribed. Unable to get information from patient as she was a quite defensive and paranoid of my interview. Will attempt to follow up with patient tomorrow to see is she is more amenable to conversation. Will include HF steward pharmacist for new medication regimen. Pt hung up phone in mid conversation, twice.   ECHO/ LVEF: 20-25%  Clinical Course:  Past Medical History:  Diagnosis Date  . Chronic blood loss anemia    Related to uterine fibroids  . Essential hypertension    Poorly controlled, repeat by report only on nicardipine 90 mg  . Fibroid    Likely complicated by by menometrorrhagia and chronic anemia  . Paroxysmal SVT (supraventricular tachycardia) (HCC)    Frequent episodes, can last anywhere from 20 minutes to 12-15 hours.  Not on beta-blocker or calcium channel blocker     Social History   Socioeconomic History  . Marital status: Single    Spouse  name: Not on file  . Number of children: Not on file  . Years of education: Not on file  . Highest education level: Not on file  Occupational History  . Not on file  Tobacco Use  . Smoking status: Not on file  . Smokeless tobacco: Not on file  Substance and Sexual Activity  . Alcohol use: Not on file  . Drug use: Not on file  . Sexual activity: Not on file  Other Topics Concern  . Not on file  Social History Narrative  . Not on file   Social Determinants of Health   Financial Resource Strain: Not on file  Food Insecurity: Not on file  Transportation Needs: Not on file  Physical Activity: Not on file  Stress: Not on file  Social Connections: Not on file   Unable to complete SDoH d/t patient resistance with conversational interview. States she used to smoke, but told this RN "you don't need to bring me one right now, I don't need to smoke at this very moment". Pt states she lives in an apt with roommates, but was very vague if she is on the lease/contributes to bills or has a permanent address. Difficult to get information from the patient during the interview.   High Risk Criteria for Readmission and/or Poor Patient Outcomes:  Heart failure hospital admissions (last 6 months): 1   No Show rate: 40%  Difficult social situation: unable to assess as pt is resistant to interview.  Demonstrates medication adherence: no  Primary Language: English  Literacy level: able to read and write per pt statement, high concern for  safe self-care.   Barriers of Care:   Medication compliance Attending f/u appts Understanding severity of HF and other illnesses Understanding diet/fluid lifestyle adjustments Financial? States she lives in an apt but doesn't have to pay bills, unsure of financial status and stable housing.  Transportation? Refused enrollment in [facility] at this time  Considerations/Referrals:   Referral made to Heart Failure Pharmacist Stewardship: yes,  appreciated.  Referral made to inpt RNCM/CSW: yes, appreciated, already on case. Pt has multiple social issues, but can be defensive/paranoid when questioning patient.   Items for Follow-up on DC/TOC: -Med adherence  -education on HF and med regimen -transportation to appts?  -financial restraints?

## 2021-01-16 NOTE — TOC Initial Note (Signed)
Transition of Care [facility]) - Initial/Assessment Note    Patient Details  Name: Holly Bradley MRN: [id number] Date of Birth: 08-May-1976  [facility] CM/SW Contact:    Sherrilyn Rist Transition of Care Supervisor Phone Number: [phone] 01/16/2021, 3:49 PM  Clinical Narrative:                 Talked to patient via telephone; Patient lives with her brother- very supportive; No PCP, has out of state Medicaid. No vehicle for transportation and patient states that she has a lot of issues getting her medication due to transportation/ out of state Medicaid. Patient is agreeable to be seen at the [facility] at discharge; they also have SW at the clinic that can assist the patient in applying for [facility] Medicaid. I also informed her of the importance of applying for [place] Medicaid. No DME at this time. TOC will continue to follow for progression of care.  Expected Discharge Plan: Home/Self Care Barriers to Discharge: Financial Resources   Patient Goals and CMS Choice Patient states their goals for this hospitalization and ongoing recovery are:: to get out of here   Choice offered to / list presented to : NA  Expected Discharge Plan and Services Expected Discharge Plan: Home/Self Care   Discharge Planning Services: CM Consult Post Acute Care Choice: NA Living arrangements for the past 2 months: Single Family Home                   DME Agency: NA       HH Arranged: NA          Prior Living Arrangements/Services Living arrangements for the past 2 months: Single Family Home Lives with:: Relatives Patient language and need for interpreter reviewed:: No Do you feel safe going back to the place where you live?: Yes      Need for Family Participation in Patient Care: No (Comment) Care giver support system in place?: Yes (comment) (Brother)   Criminal Activity/Legal Involvement Pertinent to Current Situation/Hospitalization: No - Comment as needed  Activities of  Daily Living      Permission Sought/Granted Permission sought to share information with : Case Manager                Emotional Assessment Appearance:: Appears stated age Attitude/Demeanor/Rapport: Gracious Affect (typically observed): Pleasant Orientation: : Oriented to Self,Oriented to Place,Oriented to  Time,Oriented to Situation Alcohol / Substance Use: Not Applicable    Admission diagnosis:  SVT (supraventricular tachycardia) (HCC) [I47.1] Jaundice [R17] Bilateral leg edema [R60.0] Acute exacerbation of CHF (congestive heart failure) ([place]) [I50.9] Ascites [R18.8] Fever in adult [R50.9] Acute renal failure, unspecified acute renal failure type ([place]) [N17.9] Anemia, unspecified type [D64.9] Pneumonia due to COVID-19 virus [U07.1, J12.82] Patient Active Problem List   Diagnosis Date Noted  . Bilateral leg edema   . Fibroid 01/11/2021  . Menorrhagia 01/11/2021  . Acute exacerbation of CHF (congestive heart failure) ([place]) 01/10/2021  . Pneumonia due to COVID-19 virus   . Acute renal failure ([place])   . Anemia   . Pelvic mass in female   . Hypertensive emergency   . SVT (supraventricular tachycardia) ([place]) 05/01/2020  . CHF (congestive heart failure) ([place]) 05/01/2020   PCP:  Patient, No Pcp Per Pharmacy:   [id number] - [place], [place] [address] [place] [address] Phone: [phone] Fax: [phone]  [facility],  [place] - [address] [place] [address] Phone: [phone] Fax: [phone]     Social Determinants of Health (SDOH) Interventions    Readmission Risk Interventions No flowsheet data found.

## 2021-01-16 NOTE — Progress Notes (Signed)
Subjective:  Overnight, patient underwent MR Brain W WO.  This morning, patient reports that she feels "okay." She reports that her shortness of breath has largely resolved but she still has occasional episodes where she has felt short of breath. She is wondering when she be discharged back home to her brother's house. We discussed that as we are still making further adjustments to her medications and is not yet stable for discharge. We pointed out to patient that she has not taken her morning medications to which she responds that she did not know that her medications were there. She states that she wants to make sure that her own opinions regarding her care are being considered in our medical decision making. We asked if she had any concerns with her medications to which she replied that she did not. We discussed the importance of taking her medications as prescribed. She states that she does not need help and that she has no questions or concerns for our team. Discussed with patient that if she is feeling overwhelmed that we would recommend psychiatry evaluation. She stated that she agrees that psychiatry consultation "can be helpful for some people."  Objective: Vital signs in last 24 hours: Vitals:   01/15/21 2200 01/16/21 0500 01/16/21 0600 01/16/21 0954  BP: 113/63  (!) 155/85 (!) 148/74  Pulse: 90  95 100  Resp: 13  19 19   Temp: 98 F (36.7 C) 98.3 F (36.8 C) 98.3 F (36.8 C) 98.5 F (36.9 C)  TempSrc: Oral  Oral Oral  SpO2: 98%  98% 98%  Weight:      Height:        Intake/Output Summary (Last 24 hours) at 01/16/2021 1226 Last data filed at 01/16/2021 0500 Gross per 24 hour  Intake 240 ml  Output 850 ml  Net -610 ml  Cumulative Net: -10,494.2  Filed Weights   01/13/21 0430 01/14/21 0136 01/15/21 0519  Weight: 60.5 kg 61.1 kg 59.4 kg  Physical Exam Vitals and nursing note reviewed.  Constitutional:      General: She is not in acute distress.    Appearance: She is  ill-appearing.  Neck:     Comments: No JVD Cardiovascular:     Rate and Rhythm: Normal rate and regular rhythm.     Pulses: Normal pulses.     Heart sounds: Normal heart sounds.  Pulmonary:     Effort: Pulmonary effort is normal. No respiratory distress.     Breath sounds: Normal breath sounds.  Abdominal:     Tenderness: There is no abdominal tenderness.     Comments: Large, round, firm, palpable mass extending from suprapubic region to mid abdomen  Neurological:     General: No focal deficit present.     Mental Status: She is alert. Mental status is at baseline.  Psychiatric:        Mood and Affect: Mood is anxious.        Speech: Speech is tangential.        Thought Content: Thought content is paranoid.   Labs in last 24 hours: CBC Latest Ref Rng & Units 01/16/2021 01/15/2021 01/14/2021  WBC 4.0 - 10.5 K/uL 6.0 7.6 6.1  Hemoglobin 12.0 - 15.0 g/dL 7.4(L) 7.6(L) 7.6(L)  Hematocrit 36.0 - 46.0 % 27.9(L) 28.0(L) 28.2(L)  Platelets 150 - 400 K/uL 156 163 145(L)   BMP Latest Ref Rng & Units 01/16/2021 01/15/2021 01/14/2021  Glucose 70 - 99 mg/dL 79 93 97  BUN 6 - 20 mg/dL  36(H) 38(H) 47(H)  Creatinine 0.44 - 1.00 mg/dL 1.05(H) 1.26(H) 1.34(H)  Sodium 135 - 145 mmol/L 135 135 139  Potassium 3.5 - 5.1 mmol/L 3.2(L) 3.6 3.6  Chloride 98 - 111 mmol/L 95(L) 100 103  CO2 22 - 32 mmol/L 26 23 24   Calcium 8.9 - 10.3 mg/dL 8.8(L) 9.0 9.5  Magnesium - 1.8  Imaging in last 24 hours: CT HEAD WO CONTRAST  Result Date: 01/15/2021 IMPRESSION: 1. Limited motion degraded scan. No evidence of acute intracranial abnormality. 2. Nonspecific moderate periventricular and subcortical white matter hypodensity in the cerebral white matter. While potentially representing age advanced chronic small vessel ischmic changes, other etiologies including a demyelinating disorder cannot be excluded given patient's young age. Consider brain MRI for further evaluation. Electronically Signed   By: Ilona Sorrel M.D.    On: 01/15/2021 16:29   MR BRAIN W WO CONTRAST  Result Date: 01/16/2021 IMPRESSION: 1. 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 Similar small acute right superior cerebellar lacunar infarct. No associated acute hemorrhage or mass effect. 2. Intrinsic T1 signal prior to contrast suggesting recent intravenous iron administration. No definite abnormal enhancement. 3. Diffusely abnormal marrow signal suggesting hemosiderosis. Electronically Signed   By: Genevie Ann M.D.   On: 01/16/2021 05:43   Assessment/Plan: Active Problems:   Acute exacerbation of CHF (congestive heart failure) (HCC)   Pneumonia due to COVID-19 virus   Acute renal failure (HCC)   Anemia   Pelvic mass in female   Hypertensive emergency   Fibroid   Menorrhagia   Bilateral leg edema  [name] is a 45 year old female with past medical history significant for HTN, combined systolic and diastolic heart failure, paroxysmal SVT, and massive uterine fibroids complicated by menometrorrhagia and chronic blood loss anemia who [facility] to [facility] for evaluation of weaknessfound to be in SVT, acute on chronic biventricular heart failure,hypertensive emergency,AKI, COVID-19 infection and 23cm uterine mass filling peritoneal cavity with compression of venous structures and ureters.  #Biventricular heart failure, chronic #Moderate pericardial effusion, chronic #Hypertension, chronic During prior hospitalization in May, patient noted to have EF of 40-45% with grade 2 diastolic dysfunction. Patient was lost to follow-up and nonadherent to prescribed medications following discharge. BNP on admission elevated to >4500 with echo revealing LVEF of 20-25%, moderate RV dysfunction, moderate pulmonary arterial hypertension, moderate pericardial effusion and elevated right atrial pressure. No weight for this  morning. Net negative 10L since admission with negative 345mL in past twenty four hours.  -Cardiology following, greatly appreciate recommendations -Transition furosemide to 80mg  PO daily  -Continue two tablets of isosorbide-hydralazine 20-37.5mg  three times daily -Continue metoprolol 50mg  TID  -Start spironolactone 25mg  daily  -Consider addition of ARB at later point -Strict intake/output -Daily weights  -Address factors contributing to nonadherence   #AKI, resolved Baseline cr. of 0.90 with elevation to 1.92 on admission. Improvement to 1.05 with diuresis consistent with cardiorenal syndrome. Of note, CT Abdomen/Pelvis did reveal mild hydronephrosis and displacement of the urinary bladder which may be contributing to her AKI. Fortunately, patient continues to have appropriate urine output. -Transition furosemide to 80mg  PO daily -Trend renal function on daily BMP -Avoid nephrotoxins  #Supraventricular tachycardia, recurrent  #Non-sustained ventricular tachycardia, recurrent No episodes of SVT or NSVT in past 24 hours. -Cardiology following, appreciate recommendations -Continue metoprolol 50mg  TID  #Large uterine fibroids, chronic #Iron deficiency anemia 2/2 menometrorrhagia, chronic Patient  noted to have 23cm pelvic mass on CT Abdomen/Pelvis stable from MRI in 2019. MRI from 2019 revealed compression of right common iliac artery & vein, likely compression of IVC and left common iliac vein as well, and bladder. -Consulted GYN Onc who plans to follow-up patient in outpatient setting -GYN consulted and administered Depo provera on 01/12/21 -GYN recommended calling when period starts for consideration of starting PO medications -Daily CBC  #Paranoia, active Patient continues to exhibit paranoid thought process which will be a substantial barrier to her adherence upon discharge. Patient has had psychiatric evaluation in past with recommendation to take medication for her  underlying psychiatric condition, however patient's brother reports that she is very resistant to taking prescribed medications. Patient agrees that psychiatry consultation would be appropriate for her today.  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 Findings likely secondary to severely uncontrolled hypertension over many years. -Optimize medical conditions as listed above  #COVID-19 infection, active Patient tested positive for COVID-19 on admission, however she is saturating well on room air. -Airborne and contact precautions  #Hypokalemia, active Potassium of 3.2 this morning. -Increase potassium chloride 102mEq three times daily -Start spironolactone 25mg  daily  #Hypomagnesemia, active Magnesium of 1.8 on morning labs. -Replete with 2g bolus of magnesium sulfate  #VTE ppx: enoxaparin 40mg  daily #IVF: None #Diet: Heart healthy #Code status: Full code #PT/OT recs: PT has signed off. No OT follow up, no equipment recommendations. #Dispo: Anticipated discharge pending clinical improvement.   Cato Mulligan, MD 01/16/2021, 12:26 PM Pager: [phone] After 5pm on weekdays and 1pm on weekends: On Call Pager: [phone]

## 2021-01-17 DIAGNOSIS — N179 Acute kidney failure, unspecified: Secondary | ICD-10-CM | POA: Diagnosis not present

## 2021-01-17 DIAGNOSIS — R410 Disorientation, unspecified: Secondary | ICD-10-CM

## 2021-01-17 DIAGNOSIS — U071 COVID-19: Secondary | ICD-10-CM | POA: Diagnosis not present

## 2021-01-17 DIAGNOSIS — F0150 Vascular dementia without behavioral disturbance: Secondary | ICD-10-CM

## 2021-01-17 DIAGNOSIS — J1282 Pneumonia due to coronavirus disease 2019: Secondary | ICD-10-CM | POA: Diagnosis not present

## 2021-01-17 DIAGNOSIS — I5082 Biventricular heart failure: Secondary | ICD-10-CM | POA: Diagnosis not present

## 2021-01-17 DIAGNOSIS — I5043 Acute on chronic combined systolic (congestive) and diastolic (congestive) heart failure: Secondary | ICD-10-CM | POA: Diagnosis not present

## 2021-01-17 LAB — CBC
HCT: 29.4 % — ABNORMAL LOW (ref 36.0–46.0)
Hemoglobin: 8 g/dL — ABNORMAL LOW (ref 12.0–15.0)
MCH: 17.7 pg — ABNORMAL LOW (ref 26.0–34.0)
MCHC: 27.2 g/dL — ABNORMAL LOW (ref 30.0–36.0)
MCV: 64.9 fL — ABNORMAL LOW (ref 80.0–100.0)
Platelets: 176 10*3/uL (ref 150–400)
RBC: 4.53 MIL/uL (ref 3.87–5.11)
RDW: 21.1 % — ABNORMAL HIGH (ref 11.5–15.5)
WBC: 7.1 10*3/uL (ref 4.0–10.5)
nRBC: 7.7 % — ABNORMAL HIGH (ref 0.0–0.2)

## 2021-01-17 LAB — COMPREHENSIVE METABOLIC PANEL
ALT: 55 U/L — ABNORMAL HIGH (ref 0–44)
AST: 65 U/L — ABNORMAL HIGH (ref 15–41)
Albumin: 2.9 g/dL — ABNORMAL LOW (ref 3.5–5.0)
Alkaline Phosphatase: 124 U/L (ref 38–126)
Anion gap: 12 (ref 5–15)
BUN: 33 mg/dL — ABNORMAL HIGH (ref 6–20)
CO2: 28 mmol/L (ref 22–32)
Calcium: 9 mg/dL (ref 8.9–10.3)
Chloride: 96 mmol/L — ABNORMAL LOW (ref 98–111)
Creatinine, Ser: 1.15 mg/dL — ABNORMAL HIGH (ref 0.44–1.00)
GFR, Estimated: 59 mL/min — ABNORMAL LOW (ref >60)
Glucose, Bld: 120 mg/dL — ABNORMAL HIGH (ref 70–99)
Potassium: 3.4 mmol/L — ABNORMAL LOW (ref 3.5–5.1)
Sodium: 136 mmol/L (ref 135–145)
Total Bilirubin: 1 mg/dL (ref 0.3–1.2)
Total Protein: 6.5 g/dL (ref 6.5–8.1)

## 2021-01-17 LAB — MAGNESIUM: Magnesium: 2.1 mg/dL (ref 1.7–2.4)

## 2021-01-17 MED ORDER — SPIRONOLACTONE 25 MG PO TABS
25.0000 mg | ORAL_TABLET | Freq: Once | ORAL | Status: AC
  Administered 2021-01-17: 25 mg via ORAL
  Filled 2021-01-17: qty 1

## 2021-01-17 MED ORDER — QUETIAPINE FUMARATE 25 MG PO TABS
25.0000 mg | ORAL_TABLET | Freq: Every day | ORAL | Status: DC
  Administered 2021-01-17: 25 mg via ORAL
  Filled 2021-01-17: qty 1

## 2021-01-17 MED ORDER — SPIRONOLACTONE 25 MG PO TABS
50.0000 mg | ORAL_TABLET | Freq: Every day | ORAL | Status: DC
  Administered 2021-01-18 – 2021-01-20 (×3): 50 mg via ORAL
  Filled 2021-01-17 (×3): qty 2

## 2021-01-17 NOTE — Progress Notes (Signed)
Heart Failure Stewardship Pharmacist Progress Note   PCP: Patient, No Pcp Per PCP-Cardiologist: Glenetta Hew, MD    HPI:  45 yo F with PMH of chronic systolic and diastolic HF, HTN, uterine fibroids, and history of SVT. She presented to the ED for evaluation of weakness, and was found to have SVT, acute on chronic heart failure, hypertensive emergency, AKI, and a COVID-19 infection. An ECHO was done on 01/11/21 and LVEF severely decreased to 20-25% (down from 40-45% in May 2021).  Current HF Medications: Furosemide 80 mg daily Metoprolol tartrate 50 mg TID Spironolactone 25 mg daily BiDil 20/37.5 mg 2 tabs TID  Prior to admission HF Medications: None - not taking any medications  Pertinent Lab Values: . Serum creatinine 1.15, BUN 33, Potassium 3.4, Sodium 136, BNP >4500, Magnesium 2.1   Vital Signs: . Weight: 125 lbs (admission weight: 142 lbs) . Blood pressure: 120-150/80s  . Heart rate: 90-100s  Medication Assistance / Insurance Benefits Check: Does the patient have prescription insurance?  Yes Type of insurance plan: Medicaid (out-of-state)   [facility]patient Pharmacy[facility]  [facility] willing [facility] use [place] at discharge? Yes Is the patient willing to transition their outpatient pharmacy to utilize a [facility] outpatient pharmacy?   Pending    Assessment: 1. Acute on chronic systolic CHF (EF 42-70%), likely secondary to medication noncompliance, SVT, and COVID-19 infection. NYHA class III symptoms. - Continue furosemide 80 mg daily - Continue metoprolol tartrate 50 mg TID - may be able to consolidate to 150 mg XL once daily at discharge for medication compliance - No ARB/Entresto due to high likelihood for medication noncompliance - may be more likely to take if we can consolidate her metoprolol dose to once daily - Continue spironolactone 25 mg daily - Continue BiDil 20/27.5 mg 2 tabs TID   Plan: 1) Medication changes  recommended at this time: - Continue current regimen  2) Patient assistance application(s): - None pending  3)  Education  - To be completed prior to discharge - Appreciate assistance from HF [place], PharmD, BCPS Heart Failure Cytogeneticist Phone [phone]

## 2021-01-17 NOTE — Progress Notes (Signed)
Made referral to [facility]. [place] currently at capacity for covid beds.

## 2021-01-17 NOTE — Consult Note (Addendum)
Telepsych Consultation   Reason for Consult:  Paranoia, disorganized thinking Referring Physician:  Dr. Rebeca Alert Location of Patient: [phone] Location of Provider: [facility]  Patient Identification: Holly Bradley MRN:  [id number] Principal Diagnosis: Acute exacerbation of CHF (congestive heart failure) ([place]) Diagnosis:  Principal Problem:   Acute exacerbation of CHF (congestive heart failure) ([place]) Active Problems:   CHF (congestive heart failure) ([place])   Pneumonia due to COVID-19 virus   Acute renal failure ([place])   Anemia   Pelvic mass in female   Hypertensive emergency   Fibroid   Menorrhagia   Bilateral leg edema   Total Time spent with patient: 30 minutes  Subjective:   Holly Bradley is a 45 y.o. female patient admitted with paranoia, disorganized thinking, and anxiety. Patient is seen and evaluated. She is alert and oriented x 3, however she originally thought today was Friday. Patient presents with impaired thought processes, disorganized thinking, and paranoia. She has poor attention span and unable to finish a complete thought. She presents with flight of ideas and ruminations. She appears preoccupied about her food and food processes, however is unable to complete a thought. She denies suicidal ideations and or homicidal ideations. She admits to hallucinations but can not explain what exactly she is not present as she immediate loses her thought again.   HPI:  Holly Bradley is a 45 year old female with past medical history significant for HTN, combined systolic and diastolic heart failure (echo 04/2020 - 40-45%, grade II diastolic dysfunction), moderate pericardial effusion, paroxysmal SVT, and uterine fibroids complicated by menometrorrhagia and chronic blood loss anemia who presented to [facility] on 01/10/21 for evaluation of weakness.  Patient states that for the past week she has had progressive generalized weakness, shortness of breath, palpitations  and nausea. She reports no oral intake for the past three to four days, however she states that she is very thirsty and frequently requesting a soda. She states that her weakness, shortness of breath and palpitations progressed over the past several days to the point that she desired to call EMS for evaluation in the ED. Additionally, patient reports progressive bilateral lower extremity swelling for the past several weeks. She otherwise denies chest pain, cough, sore throat, fevers, chills.  Past Psychiatric History: She denies  Risk to Self:  Yes inability to eat due to paranoia and self neglect Risk to Others:  No Prior Inpatient Therapy:   She denies Prior Outpatient Therapy:  She denies  Past Medical History:  Past Medical History:  Diagnosis Date  . Chronic blood loss anemia    Related to uterine fibroids  . Essential hypertension    Poorly controlled, repeat by report only on nicardipine 90 mg  . Fibroid    Likely complicated by by menometrorrhagia and chronic anemia  . Paroxysmal SVT (supraventricular tachycardia) (HCC)    Frequent episodes, can last anywhere from 20 minutes to 12-15 hours.  Not on beta-blocker or calcium channel blocker    Past Surgical History:  Procedure Laterality Date  . Unknown     Family History:  Family History  Family history unknown: Yes   Family Psychiatric  History: UTA Social History:  Social History   Substance and Sexual Activity  Alcohol Use None     Social History   Substance and Sexual Activity  Drug Use Not on file    Social History   Socioeconomic History  . Marital status: Single    Spouse name: Not on file  . Number of children:  Not on file  . Years of education: Not on file  . Highest education level: Not on file  Occupational History  . Not on file  Tobacco Use  . Smoking status: Not on file  . Smokeless tobacco: Not on file  Substance and Sexual Activity  . Alcohol use: Not on file  . Drug use: Not on file  .  Sexual activity: Not on file  Other Topics Concern  . Not on file  Social History Narrative  . Not on file   Social Determinants of Health   Financial Resource Strain: Not on file  Food Insecurity: Not on file  Transportation Needs: Not on file  Physical Activity: Not on file  Stress: Not on file  Social Connections: Not on file   Additional Social History:    Allergies:  No Known Allergies  Labs:  Results for orders placed or performed during the hospital encounter of 01/10/21 (from the past 48 hour(s))  Basic metabolic panel     Status: Abnormal   Collection Time: 01/16/21  6:08 AM  Result Value Ref Range   Sodium 135 135 - 145 mmol/L   Potassium 3.2 (L) 3.5 - 5.1 mmol/L   Chloride 95 (L) 98 - 111 mmol/L   CO2 26 22 - 32 mmol/L   Glucose, Bld 79 70 - 99 mg/dL    Comment: Glucose reference range applies only to samples taken after fasting for at least 8 hours.   BUN 36 (H) 6 - 20 mg/dL   Creatinine, Ser 1.05 (H) 0.44 - 1.00 mg/dL   Calcium 8.8 (L) 8.9 - 10.3 mg/dL   GFR, Estimated >60 >60 mL/min    Comment: (NOTE) Calculated using the CKD-EPI Creatinine Equation (2021)    Anion gap 14 5 - 15    Comment: Performed at [facility] [address] [place], [place] [address]  CBC with Differential/Platelet     Status: Abnormal   Collection Time: 01/16/21  6:08 AM  Result Value Ref Range   WBC 6.0 4.0 - 10.5 K/uL   RBC 4.34 3.87 - 5.11 MIL/uL   Hemoglobin 7.4 (L) 12.0 - 15.0 g/dL    Comment: Reticulocyte Hemoglobin testing may be clinically indicated, consider ordering this additional test [id number]    HCT 27.9 (L) 36.0 - 46.0 %   MCV 64.3 (L) 80.0 - 100.0 fL   MCH 17.1 (L) 26.0 - 34.0 pg   MCHC 26.5 (L) 30.0 - 36.0 g/dL   RDW 20.5 (H) 11.5 - 15.5 %   Platelets 156 150 - 400 K/uL    Comment: REPEATED TO VERIFY   nRBC 13.9 (H) 0.0 - 0.2 %   Neutrophils Relative % 78 %   Neutro Abs 4.7 1.7 - 7.7 K/uL   Lymphocytes Relative 4 %   Lymphs Abs 0.2 (L) 0.7 -  4.0 K/uL   Monocytes Relative 17 %   Monocytes Absolute 1.0 0.1 - 1.0 K/uL   Eosinophils Relative 0 %   Eosinophils Absolute 0.0 0.0 - 0.5 K/uL   Basophils Relative 0 %   Basophils Absolute 0.0 0.0 - 0.1 K/uL   Immature Granulocytes 1 %   Abs Immature Granulocytes 0.08 (H) 0.00 - 0.07 K/uL   Target Cells PRESENT     Comment: Performed at [facility] [address] [place], [place] [address]  Magnesium     Status: None   Collection Time: 01/16/21  6:08 AM  Result Value Ref Range   Magnesium  1.8 1.7 - 2.4 mg/dL    Comment: Performed at [facility], [place] [address], [place], [place] [address]  Magnesium     Status: None   Collection Time: 01/17/21  2:48 AM  Result Value Ref Range   Magnesium 2.1 1.7 - 2.4 mg/dL    Comment: Performed at [facility] [address] [place], [place] [address]  Comprehensive metabolic panel     Status: Abnormal   Collection Time: 01/17/21  2:48 AM  Result Value Ref Range   Sodium 136 135 - 145 mmol/L   Potassium 3.4 (L) 3.5 - 5.1 mmol/L   Chloride 96 (L) 98 - 111 mmol/L   CO2 28 22 - 32 mmol/L   Glucose, Bld 120 (H) 70 - 99 mg/dL    Comment: Glucose reference range applies only to samples taken after fasting for at least 8 hours.   BUN 33 (H) 6 - 20 mg/dL   Creatinine, Ser 1.15 (H) 0.44 - 1.00 mg/dL   Calcium 9.0 8.9 - 10.3 mg/dL   Total Protein 6.5 6.5 - 8.1 g/dL   Albumin 2.9 (L) 3.5 - 5.0 g/dL   AST 65 (H) 15 - 41 U/L   ALT 55 (H) 0 - 44 U/L   Alkaline Phosphatase 124 38 - 126 U/L   Total Bilirubin 1.0 0.3 - 1.2 mg/dL   GFR, Estimated 59 (L) >60 mL/min    Comment: (NOTE) Calculated using the CKD-EPI Creatinine Equation (2021)    Anion gap 12 5 - 15    Comment: Performed at [facility], [place] [address], [place], [place] [address]  CBC     Status: Abnormal   Collection Time: 01/17/21  2:48 AM  Result Value Ref Range   WBC 7.1 4.0 - 10.5 K/uL   RBC 4.53 3.87 - 5.11 MIL/uL   Hemoglobin 8.0 (L) 12.0 - 15.0 g/dL     Comment: Reticulocyte Hemoglobin testing may be clinically indicated, consider ordering this additional test [id number]    HCT 29.4 (L) 36.0 - 46.0 %   MCV 64.9 (L) 80.0 - 100.0 fL   MCH 17.7 (L) 26.0 - 34.0 pg   MCHC 27.2 (L) 30.0 - 36.0 g/dL   RDW 21.1 (H) 11.5 - 15.5 %   Platelets 176 150 - 400 K/uL   nRBC 7.7 (H) 0.0 - 0.2 %    Comment: Performed at [facility], [place] [address], [place], [place] [address]    Medications:  Current Facility-Administered Medications  Medication Dose Route Frequency Provider Last Rate Last Admin  . acetaminophen (TYLENOL) tablet 650 mg  650 mg Oral Q4H PRN Seawell, Jaimie A, DO   650 mg at 01/15/21 2059  . enoxaparin (LOVENOX) injection 40 mg  40 mg Subcutaneous Q24H Seawell, Jaimie A, DO   40 mg at 01/16/21 1545  . furosemide (LASIX) tablet 80 mg  80 mg Oral Daily Croitoru, Mihai, MD   80 mg at 01/17/21 1014  . isosorbide-hydrALAZINE (BIDIL) 20-37.5 MG per tablet 2 tablet  2 tablet Oral TID Freada Bergeron, MD   2 tablet at 01/17/21 1014  . lip balm (CARMEX) ointment   Topical PRN Angelica Pou, MD   1 application at 19/41/74 1127  . metoprolol tartrate (LOPRESSOR) tablet 50 mg  50 mg Oral TID Croitoru, Mihai, MD   50 mg at 01/17/21 1014  . pantoprazole (PROTONIX) EC tablet 40 mg  40 mg Oral Daily Seawell, Jaimie A, DO  40 mg at 01/17/21 1014  . QUEtiapine (SEROQUEL) tablet 25 mg  25 mg Oral QHS Suella Broad, FNP      . spironolactone (ALDACTONE) tablet 25 mg  25 mg Oral Once Cato Mulligan, MD      . Derrill Memo ON 01/18/2021] spironolactone (ALDACTONE) tablet 50 mg  50 mg Oral Daily Cato Mulligan, MD        Musculoskeletal: Strength & Muscle Tone: within normal limits Gait & Station: normal Patient leans: N/A  Psychiatric Specialty Exam: Physical Exam  Review of Systems  Blood pressure (!) 158/83, pulse (!) 104, temperature 98.2 F (36.8 C), temperature source Oral, resp. rate 17, height 5\' 6"  (1.676 m), weight  56.9 kg, SpO2 99 %.Body mass index is 20.26 kg/m.  General Appearance: Fairly Groomed  Eye Contact:  Minimal  Speech:  Clear and Coherent and Slow  Volume:  Normal  Mood:  Euthymic  Affect:  Congruent and Full Range  Thought Process:  Disorganized, Irrelevant and Descriptions of Associations: Circumstantial  Orientation:  Other:  A&O x 3 "thinks today is Friday"  Thought Content:  Illogical, Obsessions, Paranoid Ideation and Rumination  Suicidal Thoughts:  No  Homicidal Thoughts:  No  Memory:  Immediate;   Poor Recent;   Fair  Judgement:  Impaired  Insight:  Lacking  Psychomotor Activity:  NA  Concentration:  Concentration: Poor and Attention Span: Poor  Recall:  Poor  Fund of Knowledge:  Fair  Language:  Fair  Akathisia:  No  Handed:  Right  AIMS (if indicated):     Assets:  Communication Skills Desire for Improvement Financial Resources/Insurance Leisure Time Physical Health Social Support  ADL's:  Intact  Cognition:  Impaired,  Mild  Sleep:      Holly Bradley is a 45 year old female who presents for weakness, shortness of breath and nausea, who was found to be AoC CHF exacerbation and Covid positive (1/19). She denies any past psychiatric history at this time. SHe reports she recently moved to [place] and lives with her brother in law and his son. On evaluation she has impaired thought process, paranoia and preoccupations regarding her food and confusion. SHe is alert and oriented x 3 however is unable to hold a linear conversation or finish a complete thought. She denies suicidal ideation, homicidal ideations. She admits to hallucinations but can not express what is not present. It is likely that she maybe developing encephalopathy secondary to infection, or delirium. Will initiate antipsychotic and recommend inpatient admission once she is medically cleared.     Treatment Plan Summary: Plan Recommend inpatient psychiatry admission once she is medically stable and no longer  in quarantine period for COVID. Will start Seroqeul 25mg  po qhs for psychosis.  EKGobtained on 01/22 , QTC 423  Patient likely has ongoing delirium which presents as fluctuating cognition.    The patient's exam is notable for altered sensorium, perceptual disturbances, confusion and cognitive deficits that appear markedly different than their baseline, suggesting a diagnosis of delirium.  Virtually any medical condition or physiologic stress can precipitate delirium in a susceptible individual, with risk increasing in those with: advanced age, sensory impairments, organic brain disease (stroke, dementia, Parkinsons), psychiatric illness, major chronic medical issues, prolonged hospitalizations, postoperative status, anemia, insomnia/disturbed sleep, and severe pain. Addressing the underlying medical condition and institution of preventative measures are recommended.  - Continue to monitor and treat underlying medical causes of delirium, including infection, electrolyte disturbances, etc. - Delirium precautions - Minimize/avoid deliriogenic meds including: anticholinergic,  opiates, benzodiazepines           - Maintain hydration, oxygenation, nutrition           - Limit use of restraints and catheters           - Normalize sleep patterns by minimizing nighttime noise, light and interruptions by     -Ensure sleep apnea treatment is provided overnight.             clustering care, opening blinds during the day           - Reorient the patient frequently, provide easily visible clock and calendar           - Provide sensory aids like glasses, hearing aids           - Encourage ambulation, regular activities and visitors to maintain cognitive stimulation   -Patient would benefit from having family members at bedside to reinforce her orientation.   Disposition: Recommend psychiatric Inpatient admission when medically cleared.  This service was provided via telemedicine using a 2-way, interactive audio  and video technology.  Names of all persons participating in this telemedicine service and their role in this encounter. Name: Holly Bradley Role: Patient  Name: Sheran Fava Role: [place], FNP 01/17/2021 10:51 AM

## 2021-01-17 NOTE — Progress Notes (Signed)
Subjective:   Overnight, no acute events.  This morning, patient reports that she feels well and denies any new complaints or concerns. She reports taking all medications as prescribed today without complication. She states that she would like to go home as soon as possible, however she understands and agrees with the plan to stay given her continued electrolyte derangements, tachycardia and need for psychiatry evaluation. She states that she is looking forward to speaking with psychiatry later today. She has no further questions or concerns for our team.  Objective: Vital signs in last 24 hours: Vitals:   01/17/21 0400 01/17/21 0745 01/17/21 1014 01/17/21 1206  BP: 131/88  (!) 158/83   Pulse:   (!) 104   Resp: 17     Temp: 99.2 F (37.3 C) 98.2 F (36.8 C)  99.1 F (37.3 C)  TempSrc: Oral Oral  Oral  SpO2: 99%     Weight:      Height:        Intake/Output Summary (Last 24 hours) at 01/17/2021 1224 Last data filed at 01/17/2021 0900 Gross per 24 hour  Intake 980 ml  Output 500 ml  Net 480 ml  Cumulative Net: -10,444.2  Filed Weights   01/14/21 0136 01/15/21 0519 01/17/21 0024  Weight: 61.1 kg 59.4 kg 56.9 kg  Physical Exam Vitals and nursing note reviewed.  Constitutional:      General: She is not in acute distress.    Appearance: She is ill-appearing.  Neck:     Comments: No JVD Cardiovascular:     Rate and Rhythm: Regular rhythm. Tachycardia present.     Pulses: Normal pulses.     Heart sounds: Normal heart sounds.  Pulmonary:     Effort: Pulmonary effort is normal. No respiratory distress.     Breath sounds: Normal breath sounds.  Abdominal:     Tenderness: There is no abdominal tenderness.     Comments: Large, round, firm, palpable mass extending from suprapubic region to mid abdomen  Musculoskeletal:     Right lower leg: No edema.     Left lower leg: No edema.     Comments: No edema of the bilateral lower extremities.  Neurological:     General: No focal  deficit present.     Mental Status: She is alert. Mental status is at baseline.   Labs in last 24 hours: CBC Latest Ref Rng & Units 01/17/2021 01/16/2021 01/15/2021  WBC 4.0 - 10.5 K/uL 7.1 6.0 7.6  Hemoglobin 12.0 - 15.0 g/dL 8.0(L) 7.4(L) 7.6(L)  Hematocrit 36.0 - 46.0 % 29.4(L) 27.9(L) 28.0(L)  Platelets 150 - 400 K/uL 176 156 163   BMP Latest Ref Rng & Units 01/17/2021 01/16/2021 01/15/2021  Glucose 70 - 99 mg/dL 120(H) 79 93  BUN 6 - 20 mg/dL 33(H) 36(H) 38(H)  Creatinine 0.44 - 1.00 mg/dL 1.15(H) 1.05(H) 1.26(H)  Sodium 135 - 145 mmol/L 136 135 135  Potassium 3.5 - 5.1 mmol/L 3.4(L) 3.2(L) 3.6  Chloride 98 - 111 mmol/L 96(L) 95(L) 100  CO2 22 - 32 mmol/L 28 26 23   Calcium 8.9 - 10.3 mg/dL 9.0 8.8(L) 9.0  Magnesium - 2.1  Imaging in last 24 hours: No results found.  Assessment/Plan: Principal Problem:   Acute exacerbation of CHF (congestive heart failure) (H[facility]) Active Problems:   CHF (congestive heart failure) ([place])   Pneumonia due to COVID-19 virus   Acute renal failure (H[facility])   Anemia   Pelvic mass in female   Hypertensive emergency  Fibroid   Menorrhagia   Bilateral leg edema   Delirium  [name] is a 45 year old female with past medical history significant for HTN, combined systolic and diastolic heart failure, paroxysmal SVT, and massive uterine fibroids complicated by menometrorrhagia and chronic blood loss anemia who [facility] for evaluation of weaknessfound to be in SVT, acute on chronic biventricular heart failure,hypertensive emergency,AKI, COVID-19 infection and 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  #Biventricular heart failure, chronic #Moderate pericardial effusion, chronic #Hypertension, chronic During prior hospitalization in May, patient noted to have EF of 40-45% with grade 2 diastolic dysfunction. Patient  was lost to follow-up and nonadherent to prescribed medications following discharge. BNP on admission elevated to >4500 with echo revealing LVEF of 20-25%, moderate RV dysfunction, moderate pulmonary arterial hypertension, moderate pericardial effusion and elevated right atrial pressure. Weight down from 61.1kg on 01/23 to 56.9kg today. Net negative 10.5L since admission with positive 50mL in past twenty four hours.  -Cardiology signed off -Continue furosemide 80mg  PO daily  -Continue two tablets of isosorbide-hydralazine 20-37.5mg  three times daily -Continue metoprolol 50mg  TID, consider transitioning to metoprolol 150mg  XL tomorrow morning  -Increase spironolactone to 50mg  daily -Strict intake/output -Daily weights  -Address psychiatric condition contributing to nonadherence  #Supraventricular tachycardia, recurrent  #Non-sustained ventricular tachycardia, recurrent No episodes of SVT or NSVT in past 24 hours. -Continue metoprolol 50mg  TID  #Paranoia and psychosis, active Patient continues to exhibit paranoid thought process which will be a substantial barrier to her adherence upon discharge. Patient has had psychiatric evaluation in past with recommendation to take medication for her underlying psychiatric condition, however patient's brother reports that she is very resistant to taking prescribed medications. Patient evaluated by psychiatry today who recommended starting seroquel with plan to discharge to inpatient psychiatry upon stabilization medically. -Psychiatry following, greatly appreciate recommendations  -Start seroquel 25mg  nightly  -Discharge to inpatient psychiatry when medically stable  #Large uterine fibroids, chronic #Iron deficiency anemia 2/2 menometrorrhagia, chronic Patient noted to have 23cm pelvic mass on CT Abdomen/Pelvis stable from MRI in 2019. MRI from 2019 revealed compression of right common iliac artery & vein, likely compression of IVC and left common  iliac vein as well, and bladder. 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 Findings likely secondary to severely uncontrolled hypertension over many years. -Optimize medical conditions as listed above  #COVID-19 infection, active Patient tested positive for COVID-19 on 01/10/21, however she is saturating well on room air. Patient may break isolation on 01/21/21. -Airborne and contact precautions  #Hypokalemia, active Potassium of 3.4 this morning following aggressive IV furosemide. Patient transitioned to oral regimen with anticipated improvement in potassium. -Discontinue potassium chloride supplementation -Increase spironolactone to 50mg  daily  #AKI, resolved Baseline creatinine of 0.90 with elevation to 1.92 on admission. Improvement to 1.0-1.1 with diuresis consistent with cardiorenal syndrome. Of note, CT Abdomen/Pelvis did reveal mild hydronephrosis and displacement of the urinary bladder which may have contributed to AKI. Fortunately, patient continues to have appropriate urine output. -Continue furosemide to 80mg  PO daily -Trend renal function on daily BMP -Avoid nephrotoxins  #VTE ppx:  enoxaparin 40mg  daily #IVF: None #Diet: Heart healthy #Code status: Full code #PT/OT recs: PT has signed off. No OT follow up, no equipment recommendations. #Dispo: Anticipated discharge to inpatient psychiatry  Cato Mulligan, MD 01/17/2021, 12:24 PM Pager: [phone] After 5pm on weekdays and 1pm on weekends: On Call Pager: [phone]

## 2021-01-17 NOTE — Progress Notes (Addendum)
BP much better overnight. Brain MRI results noted. No SVT or VT overnight. Probably euvolemic. K better, renal parameters stable. On all oral meds.  At this point would hold off additional med titration, especially given concerns re: compliance due to her mental health issues. [facility] will sign off.   Medication Recommendations:  Continue current meds Other recommendations (labs, testing, etc):  BMET 2 weeks after DC Follow up as an outpatient:  f/u 02/09/2021 0900h, Dr. Johney Frame - can do labs then.

## 2021-01-17 NOTE — Progress Notes (Signed)
Occupational Therapy Treatment Patient Details Name: Holly Bradley MRN: [id number] DOB: 1976-02-19 Today's Date: 01/17/2021    History of present illness 45 y.o. female with a hx of chronic systolic and diastolic heart failure, SVT, chronic anemia secondary to uterine fibroids, and HTN who presented with worsening LE edema, weakness and dizziness found to be in SVT.   OT comments  Pt making limited progress towards OT goals this session. Pt initially agreeable to OT intervention upon arrival very pleasant and cooperative. This OTA provided instructions on simulating functional tasks during session that pt would likely engage in at home such as simulated cooking task or IADL task of house chores to assess problem solving and safety awareness skills. As pt report she lives with her brother and they both share household chores.  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 Will continue to follow acutely pending participation.   Follow Up Recommendations  No OT follow up    Equipment Recommendations  None recommended by OT    Recommendations for Other Services      Precautions / Restrictions Precautions Precautions: Fall;Other (comment) Precaution Comments: Watch HR and BP Restrictions Weight Bearing Restrictions: No       Mobility Bed Mobility               General bed mobility comments: pt able to transition into long sitting in bed with no assist but would not transition OOB for IADL tasks  Transfers                 General transfer comment: pt declined    Balance                                            ADL either performed or assessed with clinical judgement   ADL                                         General ADL Comments: unable to address ADLS/ higher level IADLS as pt adamantly stating that her "mind is fine" and declines to complete simulated cooking task or IADL task     Vision       Perception     Praxis      Cognition Arousal/Alertness: Awake/alert Behavior During Therapy: Agitated;WFL for tasks assessed/performed (becomes agitated when asking pt to complete ADL/ IADL problem solving task) Overall Cognitive Status: Impaired/Different from baseline Area of Impairment: Awareness;Safety/judgement                         Safety/Judgement: Decreased awareness of safety;Decreased awareness of deficits Awareness: Intellectual   General Comments: pt more agitated this session unwilling to attempt any simulated IADL tasks, suspect some impaired executive functioning skills however unable to further assess d/t decline in participation        Exercises     Shoulder Instructions       General Comments vss on RA  Pertinent Vitals/ Pain       Pain Assessment: No/denies pain  Home Living                                          Prior Functioning/Environment              Frequency  Min 2X/week        Progress Toward Goals  OT Goals(current goals can now be found in the care plan section)  Progress towards OT goals: Progressing toward goals  Acute Rehab OT Goals Patient Stated Goal: none stated OT Goal Formulation: Patient unable to participate in goal setting Time For Goal Achievement: 01/26/21 Potential to Achieve Goals: [place] Discharge plan remains appropriate;Frequency remains appropriate    Co-evaluation                 AM-PAC OT "6 Clicks" Daily Activity     Outcome Measure   Help from another person eating meals?: None Help from another person taking care of personal grooming?:  None Help from another person toileting, which includes using toliet, bedpan, or urinal?: None Help from another person bathing (including washing, rinsing, drying)?: A Little Help from another person to put on and taking off regular upper body clothing?: None Help from another person to put on and taking off regular lower body clothing?: None 6 Click Score: 23    End of Session    OT Visit Diagnosis: Unsteadiness on feet (R26.81);Muscle weakness (generalized) (M62.81)   Activity Tolerance Treatment limited secondary to agitation   Patient Left in bed;with call bell/phone within reach;with bed alarm set;Other (comment) (doctors entering room)   Nurse Communication Mobility status;Other (comment) (declined participation)        Time: 6270-3500 OT Time Calculation (min): 20 min  Charges: OT General Charges $OT Visit: 1 Visit OT Treatments $Self Care/Home Management : 8-22 mins  [name], COTA/L Acute Rehabilitation Services [phone]    [name] 01/17/2021, 2:10 PM

## 2021-01-17 NOTE — Progress Notes (Signed)
Heart Failure Nurse Navigator Progress Note  Plan is to DC to [facility] when medically cleared and bed available. Will attempt to reach out once pt home to work on Heart Impact TOC follow up potential.   Referral made to Heart Impact TOC clinic: potential  Items for Follow-up on DC/TOC: - medication adherence - ability for safe self-care - medication availability - Transportation - financial stressors - compliance with follow up appointments  Pricilla Holm, RN, BSN Heart Failure Nurse Navigator Heart Impact Team [phone]

## 2021-01-18 DIAGNOSIS — I471 Supraventricular tachycardia: Secondary | ICD-10-CM | POA: Diagnosis not present

## 2021-01-18 DIAGNOSIS — U071 COVID-19: Secondary | ICD-10-CM | POA: Diagnosis not present

## 2021-01-18 DIAGNOSIS — N179 Acute kidney failure, unspecified: Secondary | ICD-10-CM | POA: Diagnosis not present

## 2021-01-18 DIAGNOSIS — J1282 Pneumonia due to coronavirus disease 2019: Secondary | ICD-10-CM | POA: Diagnosis not present

## 2021-01-18 DIAGNOSIS — I5043 Acute on chronic combined systolic (congestive) and diastolic (congestive) heart failure: Secondary | ICD-10-CM | POA: Diagnosis not present

## 2021-01-18 DIAGNOSIS — I5082 Biventricular heart failure: Secondary | ICD-10-CM | POA: Diagnosis not present

## 2021-01-18 LAB — CBC
HCT: 27.4 % — ABNORMAL LOW (ref 36.0–46.0)
Hemoglobin: 7.8 g/dL — ABNORMAL LOW (ref 12.0–15.0)
MCH: 18.5 pg — ABNORMAL LOW (ref 26.0–34.0)
MCHC: 28.5 g/dL — ABNORMAL LOW (ref 30.0–36.0)
MCV: 64.9 fL — ABNORMAL LOW (ref 80.0–100.0)
Platelets: 167 10*3/uL (ref 150–400)
RBC: 4.22 MIL/uL (ref 3.87–5.11)
RDW: 23.2 % — ABNORMAL HIGH (ref 11.5–15.5)
WBC: 7.5 10*3/uL (ref 4.0–10.5)
nRBC: 2.4 % — ABNORMAL HIGH (ref 0.0–0.2)

## 2021-01-18 LAB — BASIC METABOLIC PANEL
Anion gap: 12 (ref 5–15)
BUN: 32 mg/dL — ABNORMAL HIGH (ref 6–20)
CO2: 29 mmol/L (ref 22–32)
Calcium: 8.8 mg/dL — ABNORMAL LOW (ref 8.9–10.3)
Chloride: 94 mmol/L — ABNORMAL LOW (ref 98–111)
Creatinine, Ser: 1.13 mg/dL — ABNORMAL HIGH (ref 0.44–1.00)
GFR, Estimated: >60 mL/min (ref >60)
Glucose, Bld: 89 mg/dL (ref 70–99)
Potassium: 3.9 mmol/L (ref 3.5–5.1)
Sodium: 135 mmol/L (ref 135–145)

## 2021-01-18 LAB — MAGNESIUM: Magnesium: 1.9 mg/dL (ref 1.7–2.4)

## 2021-01-18 MED ORDER — METOPROLOL TARTRATE 50 MG PO TABS: 75.0000 mg | ORAL_TABLET | Freq: Three times a day (TID) | ORAL | Status: DC

## 2021-01-18 MED ORDER — METOPROLOL TARTRATE 5 MG/5ML IV SOLN
INTRAVENOUS | Status: AC
  Filled 2021-01-18: qty 5

## 2021-01-18 MED ORDER — ADENOSINE 6 MG/2ML IV SOLN
INTRAVENOUS | Status: AC
  Administered 2021-01-18: 12 mg via INTRAVENOUS
  Filled 2021-01-18: qty 2

## 2021-01-18 MED ORDER — ADENOSINE 6 MG/2ML IV SOLN
6.0000 mg | Freq: Once | INTRAVENOUS | Status: AC
  Filled 2021-01-18 (×3): qty 2

## 2021-01-18 MED ORDER — ADENOSINE 6 MG/2ML IV SOLN
12.0000 mg | Freq: Once | INTRAVENOUS | Status: AC
  Administered 2021-01-18: 12 mg via INTRAVENOUS

## 2021-01-18 MED ORDER — AQUAPHOR EX OINT
TOPICAL_OINTMENT | Freq: Every day | CUTANEOUS | Status: DC | PRN
  Filled 2021-01-18: qty 50

## 2021-01-18 MED ORDER — METOPROLOL SUCCINATE ER 100 MG PO TB24
200.0000 mg | ORAL_TABLET | Freq: Every day | ORAL | Status: DC
  Administered 2021-01-19 – 2021-01-27 (×9): 200 mg via ORAL
  Filled 2021-01-18 (×9): qty 2

## 2021-01-18 MED ORDER — METOPROLOL TARTRATE 50 MG PO TABS
75.0000 mg | ORAL_TABLET | Freq: Three times a day (TID) | ORAL | Status: AC
  Administered 2021-01-18 (×2): 75 mg via ORAL
  Filled 2021-01-18 (×2): qty 1

## 2021-01-18 MED ORDER — METOPROLOL TARTRATE 5 MG/5ML IV SOLN
INTRAVENOUS | Status: AC
  Administered 2021-01-18: 5 mg
  Filled 2021-01-18: qty 60

## 2021-01-18 MED ORDER — SODIUM CHLORIDE 0.9 % IV BOLUS
250.0000 mL | Freq: Once | INTRAVENOUS | Status: AC
  Administered 2021-01-18: 250 mL via INTRAVENOUS

## 2021-01-18 MED ORDER — ADENOSINE 6 MG/2ML IV SOLN
INTRAVENOUS | Status: AC
  Administered 2021-01-18: 6 mg via INTRAVENOUS
  Filled 2021-01-18: qty 2

## 2021-01-18 NOTE — Progress Notes (Signed)
Heart Failure Stewardship Pharmacist Progress Note   PCP: Patient, No Pcp Per PCP-Cardiologist: Glenetta Hew, MD    HPI:  45 yo F with PMH of chronic systolic and diastolic HF, HTN, uterine fibroids, and history of SVT. She presented to the ED for evaluation of weakness, and was found to have SVT, acute on chronic heart failure, hypertensive emergency, AKI, and a COVID-19 infection. An ECHO was done on 01/11/21 and LVEF severely decreased to 20-25% (down from 40-45% in May 2021).  Current HF Medications: Furosemide 80 mg daily Metoprolol tartrate 75 mg TID today, then metoprolol XL 200 mg daily starting on 1/28 Spironolactone 50 mg daily BiDil 20/37.5 mg 2 tabs TID  Prior to admission HF Medications: None - not taking any medications  Pertinent Lab Values: . Serum creatinine 1.13, BUN 32, Potassium 3.9, Sodium 135, BNP >4500, Magnesium 1.9   Vital Signs: . Weight: 126 lbs (admission weight: 142 lbs) . Blood pressure: 120/80s  . Heart rate: 90-100s - a few brief episodes of NSVT overnight (given adenosine)  Medication Assistance / Insurance Benefits Check: Does the patient have prescription insurance?  Yes Type of insurance plan: Medicaid (out-of-state)   Outpatient Pharmacy:  Prior to admission outpatient pharmacy: [facility] Is the patient willing to use [facility] at discharge? Yes   Assessment: 1. Acute on chronic systolic CHF (EF 70-96%), likely secondary to medication noncompliance, SVT, and COVID-19 infection. NYHA class III symptoms. - Continue furosemide 80 mg daily - Continue metoprolol tartrate 75 mg TID today, with plans to switch to metoprolol XL 200 mg daily tomorrow - No ARB/Entresto due to high likelihood for medication noncompliance - may be more likely to take if we can consolidate her metoprolol dose to once daily - Continue spironolactone 50 mg daily - dose increased due to hypokalemia despite aggressive potassium replacement per IMTS - Continue BiDil  20/27.5 mg 2 tabs TID   Plan: 1) Medication changes recommended at this time: - Continue current regimen  2) Patient assistance application(s): - None pending  3)  Education  - To be completed prior to discharge - Appreciate assistance from HF [place], PharmD, BCPS Heart Failure Cytogeneticist Phone [phone]

## 2021-01-18 NOTE — Progress Notes (Addendum)
Pt returned to SVT, HR up to 160s, paged cardiology, paged IM . Orders to give pts TID metoprolol 50 mg STAT. 10 am metoprolol to be held per verbal order by MD. VS frequency increased . Pt laying in bed. By bedside with pt.

## 2021-01-18 NOTE — Progress Notes (Signed)
MD arrived at 19:35. Adenosine 6mg  with no response. Adenosine 12 mg given, reverted to sinus tach. 5 mg IV metoprolol given per MD. RN remains at bedside.

## 2021-01-18 NOTE — Progress Notes (Signed)
   01/18/21 0532  Assess: MEWS Score  BP 109/81  Pulse Rate (!) 167  Assess: MEWS Score  MEWS Temp 0  MEWS Systolic 0  MEWS Pulse 3  MEWS RR 0  MEWS LOC 0  MEWS Score 3  MEWS Score Color Yellow  Assess: if the MEWS score is Yellow or Red  Were vital signs taken at a resting state? Yes  Focused Assessment Change from prior assessment (see assessment flowsheet)  Early Detection of Sepsis Score *See Row Information* Low  MEWS guidelines implemented *See Row Information* Yes  Treat  MEWS Interventions Administered prn meds/treatments  Take Vital Signs  Increase Vital Sign Frequency  Yellow: Q 2hr X 2 then Q 4hr X 2, if remains yellow, continue Q 4hrs  Escalate  MEWS: Escalate Yellow: discuss with charge nurse/RN and consider discussing with provider and RRT  Notify: Charge Nurse/RN  Name of Charge Nurse/RN Notified [place] RN  Date Charge Nurse/RN Notified 01/18/21  Time Charge Nurse/RN Notified 0500  Notify: Provider  Provider Name/Title Jinwala  Date Provider Notified 01/18/21  Time Provider Notified 0500  Notification Type Page  Notification Reason Change in status  Response Other (Comment) (Verbal orders see MAR)  Date of Provider Response 01/18/21  Time of Provider Response 0525  Notify: Rapid Response  Name of Rapid Response RN Notified Mindy RN  Date Rapid Response Notified 01/18/21  Time Rapid Response Notified 0525  Document  Patient Outcome Other (Comment) (RN remians at bedside)   

## 2021-01-18 NOTE — Significant Event (Signed)
Rapid Response Event Note   Reason for Call :  SVT-160s, orders to give Adenosine  Called originally at 0530 as FYI-pt's HR-140s-160s SVT. MD had been notified and pt's 50mg  metoprolol day shift dose given early. Advised RN to check frequent BPs and to notify if pt declines.   Called again at [place] d/t pt still in SVT-160s. MDs to bedside. Adenosine ordered.  Initial Focused Assessment:  Pt lying in bed with eyes open. Pt alert and oriented, but lethargic. Pt c/o no SOB/chest pain. Lungs diminished t/o. ABD large and distended. Skin hot to touch.  T-100.4, HR-167, BP-107/85, RR-19, SpO2-98% on RA.   Pt placed on zoll. Adenosine given while MDs present at bedside.   Interventions:  50mg  metoprolol given PO(PTA RRT) 6mg  Adenosine @ 0634 with little change in HR 12mg  Adenosine @ 0637 with HR decreasing to 110s(SR) 250mg  NS bolus ordered for HR and fever  Plan of Care:  Pt in SR, rate-100s. Pt has a fever which could be driving tachycardia. NS bolus infusing now and tylenol to be given. Per MD, hold 1000 dose of metoprolol. Continue to monitor closely. Call RRT if further assistance needed.    Event Summary:   MD Notified: MDS Allyson Sabal and [place] to bedside Call [place] [phone]  Dillard Essex, RN

## 2021-01-18 NOTE — Progress Notes (Deleted)
   01/18/21 0532  Assess: MEWS Score  BP 109/81  Pulse Rate (!) 167  Assess: MEWS Score  MEWS Temp 0  MEWS Systolic 0  MEWS Pulse 3  MEWS RR 0  MEWS LOC 0  MEWS Score 3  MEWS Score Color Yellow  Assess: if the MEWS score is Yellow or Red  Were vital signs taken at a resting state? Yes  Focused Assessment Change from prior assessment (see assessment flowsheet)  Early Detection of Sepsis Score *See Row Information* Low  MEWS guidelines implemented *See Row Information* Yes  Treat  MEWS Interventions Administered prn meds/treatments  Take Vital Signs  Increase Vital Sign Frequency  Yellow: Q 2hr X 2 then Q 4hr X 2, if remains yellow, continue Q 4hrs  Escalate  MEWS: Escalate Yellow: discuss with charge nurse/RN and consider discussing with provider and RRT  Notify: Charge Nurse/RN  Name of Charge Nurse/RN Notified [place] RN  Date Charge Nurse/RN Notified 01/18/21  Time Charge Nurse/RN Notified 0500  Notify: Provider  Provider Name/Title Allyson Sabal  Date Provider Notified 01/18/21  Time Provider Notified 0500  Notification Type Page  Notification Reason Change in status  Response Other (Comment) (Verbal orders see [facility])  Date of Provider Response 01/18/21  Time of Provider Response 0525  Notify: Rapid Response  Name of Rapid Response RN Notified Mindy RN  Date Rapid Response Notified 01/18/21  Time Rapid Response Notified 0525  Document  Patient Outcome Other (Comment) (RN remians at bedside)

## 2021-01-18 NOTE — Progress Notes (Signed)
Subjective:   0500: patient back in SVT. Patient received 50mg  metoprolol, 6mg  adenosine then 12mg  adenosine with return to normal sinus rhythm (HR 90s). Patient noted to have temperature of 100.4 and given 250cc bolus of NS.  This morning, patient participates minimally in providing interval history. She reports that she feels tired. She otherwise denies shortness of breath, chest pain, palpitations, abdominal pain, cough.  Objective: Vital signs in last 24 hours: Vitals:   01/18/21 0605 01/18/21 0615 01/18/21 0700 01/18/21 1059  BP: 114/90 113/87 128/81   Pulse:   97   Resp: 15 (!) 21 12   Temp:   99.4 F (37.4 C) 98.7 F (37.1 C)  TempSrc:   Axillary Oral  SpO2: 98% 97% 98%   Weight:      Height:        Intake/Output Summary (Last 24 hours) at 01/18/2021 1105 Last data filed at 01/18/2021 1058 Gross per 24 hour  Intake 717 ml  Output 600 ml  Net 117 ml  Cumulative Net: -9847.2  Filed Weights   01/15/21 0519 01/17/21 0024 01/18/21 0340  Weight: 59.4 kg 56.9 kg 57.3 kg  Physical Exam Vitals and nursing note reviewed.  Constitutional:      General: She is not in acute distress.    Appearance: She is ill-appearing.  HENT:     Mouth/Throat:     Comments: Dry, chapped lips Neck:     Comments: JVD to mid-neck Cardiovascular:     Rate and Rhythm: Regular rhythm. Tachycardia present.     Pulses: Normal pulses.     Heart sounds: Normal heart sounds.  Pulmonary:     Effort: Pulmonary effort is normal. No respiratory distress.     Breath sounds: Normal breath sounds.  Abdominal:     Tenderness: There is no abdominal tenderness.     Comments: Large, round, firm, palpable mass extending from suprapubic region to mid abdomen  Musculoskeletal:     Right lower leg: No edema.     Left lower leg: No edema.     Comments: Trace pitting edema of the left ankle  Neurological:     General: No focal deficit present.     Mental Status: Mental status is at baseline.   Psychiatric:        Attention and Perception: She is inattentive.        Speech: She is noncommunicative.        Behavior: Behavior is withdrawn.   Labs in last 24 hours: CBC Latest Ref Rng & Units 01/18/2021 01/17/2021 01/16/2021  WBC 4.0 - 10.5 K/uL 7.5 7.1 6.0  Hemoglobin 12.0 - 15.0 g/dL 7.8(L) 8.0(L) 7.4(L)  Hematocrit 36.0 - 46.0 % 27.4(L) 29.4(L) 27.9(L)  Platelets 150 - 400 K/uL 167 176 156   BMP Latest Ref Rng & Units 01/18/2021 01/17/2021 01/16/2021  Glucose 70 - 99 mg/dL 89 120(H) 79  BUN 6 - 20 mg/dL 32(H) 33(H) 36(H)  Creatinine 0.44 - 1.00 mg/dL 1.13(H) 1.15(H) 1.05(H)  Sodium 135 - 145 mmol/L 135 136 135  Potassium 3.5 - 5.1 mmol/L 3.9 3.4(L) 3.2(L)  Chloride 98 - 111 mmol/L 94(L) 96(L) 95(L)  CO2 22 - 32 mmol/L 29 28 26   Calcium 8.9 - 10.3 mg/dL 8.8(L) 9.0 8.8(L)  Magnesium - 2.1  Imaging in last 24 hours: No results found.  Assessment/Plan: Principal Problem:   Acute exacerbation of CHF (congestive heart failure) (HCC) Active Problems:   CHF (congestive heart failure) ([place])   Pneumonia due to COVID-19 virus  Acute renal failure (HCC)   Anemia   Pelvic mass in female   Hypertensive emergency   Fibroid   Menorrhagia   Bilateral leg edema   Delirium  Holly Bradley is a 45 year old female with past medical history significant for HTN, combined systolic and diastolic heart failure, paroxysmal SVT, and massive uterine fibroids complicated by menometrorrhagia and chronic blood loss anemia who presented to [facility] for evaluation of weaknessfound to be in SVT, acute on chronic biventricular heart failure,hypertensive emergency,AKI, COVID-19 infection and 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  #Biventricular heart failure, chronic #Moderate pericardial effusion, chronic #Hypertension, chronic During prior hospitalization in May,  patient noted to have EF of 40-45% with grade 2 diastolic dysfunction. Patient was lost to follow-up and nonadherent to prescribed medications following discharge. BNP on admission elevated to >4500 with echo revealing LVEF of 20-25%, moderate RV dysfunction, moderate pulmonary arterial hypertension, moderate pericardial effusion and elevated right atrial pressure. Weight down from 61.1kg on 01/23 to 57.3kg today, stable from yesterday. Net negative 9.8L since admission with positive 139mL in past 24 hours. -Cardiology following, appreciate recommendations -Continue furosemide 80mg  PO daily  -Continue two tablets of isosorbide-hydralazine 20-37.5mg  three times daily -Increase metoprolol to 75mg  TID, plan to transition to 200mg  XL 1/28 AM  -Continue spironolactone 50mg  daily -Strict intake/output -Daily weights  #Supraventricular tachycardia, recurrent  #Non-sustained ventricular tachycardia, recurrent Patient had repeat episode of SVT this morning which responded well to adenosine. Patient did have temperature to 100.4 which likely caused recurrence this morning, however she has had recurrent episodes throughout this hospitalization. -Cardiology following, appreciate recommendations:  -Increase metoprolol to 75mg  TID, plan to transition to 200mg  XL 1/28 AM  #Fever Patient had temperature of 100.4 this morning. Patient does have known COVID-19 infection (positive test on 01/10/21), however patient has not had fever other than on arrival to the ED. No clear etiology to explain patient's temperature this morning. Approximately 4% of patients may have fever with quetiapine use which was started this morning. No evidence to suggest superimposed bacterial infection at this time. If patient is to develop another fever, she would benefit from further infectious workup. -If repeat fever, obtain blood cultures, CXR, urinalysis  #Paranoia and psychosis, active Patient appears much more somnolent on  examination today following initiating quetiapine yesterday evening. Drowsiness is an observed adverse effect in 18-57% of patients. Current psychiatry recommendations are to discharge to inpatient psychiatry upon medical stabilization and bed availability. -Psychiatry following, greatly appreciate recommendations  -Seroquel 25mg  nightly, will reach out to psychiatry to discuss patient's response to treatment  -Discharge to inpatient psychiatry when medically stable  #Large uterine fibroids, chronic #Iron deficiency anemia 2/2 menometrorrhagia, chronic Patient noted to have 23cm pelvic mass on CT Abdomen/Pelvis stable from MRI in 2019. MRI from 2019 revealed compression of right common iliac artery & vein, likely compression of IVC and left common iliac vein as well, and bladder. 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 Findings likely secondary to severely uncontrolled hypertension over many years. -Optimize medical conditions as listed above  #COVID-19 infection, active Patient tested  positive for COVID-19 on 01/10/21, however she is saturating well on room air. Patient may break isolation on 01/21/21. -Airborne and contact precautions  #AKI, resolved Baseline creatinine of 0.90 with elevation to 1.92 on admission. Improvement to 1.0-1.1 with diuresis consistent with cardiorenal syndrome. Of note, CT Abdomen/Pelvis did reveal mild hydronephrosis and displacement of the urinary bladder which may have contributed to AKI. Fortunately, patient continues to have appropriate urine output. -Continue furosemide to 80mg  PO daily -Trend  renal function on daily BMP -Avoid nephrotoxins  #VTE ppx: enoxaparin 40mg  daily #IVF: None #Diet: Heart healthy #Code status: Full code #PT/OT recs: PT has signed off. No OT follow up, no equipment recommendations. #Dispo: Anticipated discharge to inpatient psychiatry  Cato Mulligan, MD 01/18/2021, 11:05 AM Pager: [phone] After 5pm on weekdays and 1pm on weekends: On Call Pager: [phone]

## 2021-01-18 NOTE — Progress Notes (Signed)
duplicate

## 2021-01-18 NOTE — Progress Notes (Signed)
   01/18/21 0540  Assess: MEWS Score  BP (!) 136/100  ECG Heart Rate (!) 169  Resp (!) 25  SpO2 97 %  Assess: MEWS Score  MEWS Temp 0  MEWS Systolic 0  MEWS Pulse 3  MEWS RR 1  MEWS LOC 0  MEWS Score 4  MEWS Score Color Red  Assess: if the MEWS score is Yellow or Red  Were vital signs taken at a resting state? Yes  Focused Assessment Change from prior assessment (see assessment flowsheet)  Early Detection of Sepsis Score *See Row Information* Low  MEWS guidelines implemented *See Row Information* Yes  Treat  MEWS Interventions Other (Comment) (Metoprolol given)  Pain Scale 0-10  Pain Score 0  Take Vital Signs  Increase Vital Sign Frequency  Red: Q 1hr X 4 then Q 4hr X 4, if remains red, continue Q 4hrs  Escalate  MEWS: Escalate Red: discuss with charge nurse/RN and provider, consider discussing with RRT  Notify: Charge Nurse/RN  Name of Charge Nurse/RN Notified [place] Rn  Date Charge Nurse/RN Notified 01/18/21  Time Charge Nurse/RN Notified 0623  Notify: Provider  Provider Name/Title Allyson Sabal  Date Provider Notified 01/18/21  Time Provider Notified 0600  Notification Type Page  Notification Reason Change in status  Response See new orders  Date of Provider Response 01/18/21  Time of Provider Response 0603  Notify: Rapid Response  Name of Rapid Response RN Notified Mindy RN  Date Rapid Response Notified 01/18/21  Time Rapid Response Notified 7628  Document  Patient Outcome Stabilized after interventions  Progress note created (see row info) Yes

## 2021-01-18 NOTE — Progress Notes (Signed)
Recurrent symptomatic AVNRT, failed to terminate with adenosine 6 mg bolus, but converted with 12 mg bolus. Frequent PVCs are seen which may be triggering the reentry circuit . IV metoprolol given and the dose of oral metoprolol was increased.

## 2021-01-18 NOTE — Progress Notes (Signed)
Pt has reverted back to SVT. IM paged. At bedside with Pt. Awaiting further orders.

## 2021-01-18 NOTE — Progress Notes (Signed)
Progress Note  [name] Date of Encounter: 01/18/2021  [facility] Cardiologist: Glenetta Hew, MD   Subjective   "Not feeling well" Recurrent SVT early AM, in setting of low grade fever. ECG highly suggestive of AV node reentry. Resolved w adenosine. SBP lower, DBP still borderline high. A few very brief episodes of NSVT 3-11 beats.  Inpatient Medications    Scheduled Meds: . enoxaparin (LOVENOX) injection  40 mg Subcutaneous Q24H  . furosemide  80 mg Oral Daily  . isosorbide-hydrALAZINE  2 tablet Oral TID  . metoprolol tartrate  50 mg Oral TID  . QUEtiapine  25 mg Oral QHS  . spironolactone  50 mg Oral Daily   Continuous Infusions: PRN Meds: acetaminophen, lip balm   Vital Signs          Vitals:   01/18/21 0555 01/18/21 0600 01/18/21 0605 01/18/21 0615  BP: (!) 118/94 (!) 118/98 114/90 113/87  Pulse:      Resp: (!) 22 (!) 21 15 (!) 21  Temp:      TempSrc:      SpO2: 100% 99% 98% 97%  Weight:      Height:        Intake/Output Summary (Last 24 hours) at 01/18/2021 0802 Last data filed at 01/17/2021 2100    Gross per 24 hour  Intake 980 ml  Output 600 ml  Net 380 ml   Last 3 Weights 01/18/2021 01/18/2021 01/17/2021  Weight (lbs) 126 lb 5.2 oz (No Data) 125 lb 8 oz  Weight (kg) 57.3 kg (No Data) 56.926 kg      Telemetry    Abrupt onset SVT 0500h, reolved after IV adenosine; 3 episodes of NSVT in last 24 h (3,7, 11 beats repsectively) - Personally Reviewed  ECG    Narrow QRS short RP tachycardia w retrograde P wave on terminal QRS, c/w AVNRT- Personally Reviewed  Physical Exam  IIl appearing GEN:No acute distress.   Neck:No JVD Cardiac:RRR, no murmurs, rubs, or gallops.  Respiratory:Clear to auscultation bilaterally. [id number] suprapubic mass MS:No edema; No deformity. Neuro:Nonfocal  Psych: Normal affect   Labs    High Sensitivity Troponin:   Last Labs        Recent  Labs  Lab 01/10/21 1449 01/10/21 1600 01/10/21 1927  TROPONINIHS 802* 922* 888*        Chemistry Last Labs        Recent Labs  Lab 01/16/21 0608 01/17/21 0248 01/18/21 0100  NA 135 136 135  K 3.2* 3.4* 3.9  CL 95* 96* 94*  CO2 26 28 29   GLUCOSE 79 120* 89  BUN 36* 33* 32*  CREATININE 1.05* 1.15* 1.13*  CALCIUM 8.8* 9.0 8.8*  PROT  --  6.5  --   ALBUMIN  --  2.9*  --   AST  --  65*  --   ALT  --  55*  --   ALKPHOS  --  124  --   BILITOT  --  1.0  --   GFRNONAA >60 59* >60  ANIONGAP 14 12 12        Hematology Last Labs        Recent Labs  Lab 01/16/21 0608 01/17/21 0248 01/18/21 0100  WBC 6.0 7.1 7.5  RBC 4.34 4.53 4.22  HGB 7.4* 8.0* 7.8*  HCT 27.9* 29.4* 27.4*  MCV 64.3* 64.9* 64.9*  MCH 17.1* 17.7* 18.5*  MCHC 26.5* 27.2* 28.5*  RDW 20.5* 21.1* 23.2*  PLT 156 176  167      BNP Last Labs   No results for input(s): BNP, PROBNP in the last 168 hours.     DDimer  Last Labs        Recent Labs  Lab 01/12/21 0335 01/13/21 0427 01/14/21 0344  DDIMER 2.71* 2.36* 1.92*       Radiology    Imaging Results (Last 48 hours)  No results found.    Cardiac Studies    ECHO 01/11/2021  1. Left ventricular ejection fraction, by estimation, is 20 to 25%. The  left ventricle has severely decreased function. The left ventricle  demonstrates global hypokinesis. The left ventricular internal cavity size  was mildly to moderately dilated. There  is moderate concentric left ventricular hypertrophy. Left ventricular  diastolic parameters are consistent with Grade II diastolic dysfunction  (pseudonormalization). Elevated left ventricular end-diastolic pressure.  The average left ventricular global  longitudinal strain is -5.8 %. The global longitudinal strain is abnormal.  2. Right ventricular systolic function is moderately reduced. The right  ventricular size is normal. There is moderately elevated pulmonary artery  systolic pressure.   3. Left atrial size was mildly dilated.  4. Right atrial size was mildly dilated.  5. Moderate pericardial effusion. The pericardial effusion is  circumferential. There is no evidence of cardiac tamponade.  6. The mitral valve is normal in structure. Mild mitral valve  regurgitation. No evidence of mitral stenosis.  7. The aortic valve is tricuspid. Aortic valve regurgitation is not  visualized. No aortic stenosis is present.  8. The inferior vena cava is dilated in size with <50% respiratory  variability, suggesting right atrial pressure of 15 mmHg.    ECHO 05/02/2020  1. Left ventricular ejection fraction, by estimation, is 40 to 45%. The  left ventricle has mildly decreased function. The left ventricle  demonstrates global hypokinesis. There is mild concentric left ventricular  hypertrophy. Left ventricular diastolic  parameters are consistent with Grade II diastolic dysfunction  (pseudonormalization). Elevated left atrial pressure.  2. Right ventricular systolic function is low normal. The right  ventricular size is normal. There is moderately elevated pulmonary artery  systolic pressure.  3. Left atrial size was mildly dilated.  4. Right atrial size was mildly dilated.  5. Moderate pericardial effusion. The pericardial effusion is  circumferential. There is no evidence of cardiac tamponade.  6. The mitral valve is normal in structure. No evidence of mitral valve  regurgitation.  7. Tricuspid valve regurgitation is mild to moderate.  8. The aortic valve is normal in structure. Aortic valve regurgitation is  not visualized.  9. The inferior vena cava is dilated in size with <50% respiratory  variability, suggesting right atrial pressure of 15 mmHg  Patient Profile     45 y.o. femalewith acute on chronic systolic and diastolic HF with worsening and severely depressed LVEF, presenting with HF exacerbation and SVT in the setting of COVID-19 infection, anemia and newly  diagnosed pelvic mass w bilateral ureteral compression and hydronephrosis.  Assessment & Plan     1. CHF:on high dose BiDil + metoprolol + spironolactone and loop diuretic. In/out fluids and K level balanced. Will hold off ARB until the course of current febrile illness clarifies itself. Transition to metoprolol succinate in AM.  Cause of CMP uncertain, but uncontrolled HTN likely at least contributory. Suspect nonischemic etiology. Not a candidate for invasive coronary evaluation. 2. NSVT:occasional, asymptomatic. Keep K >4. 3. SVT (AV node reentry):narrow QRS short RP tachycardia with retrograde P wave on  tail of QRS, promptly responsive to adenosine. Increase metoprolol to total 200 mg daily (tartrate 75 mg for next two doses, then succinate 200 mg daily starting in AM). 4. HTN:DBP remains high. Plan to add ARB unless (possible) infection causes drop in BP. 5. Moderate pericardial effusion:seen on May 2021 echo, unchanged. No signs of tamponade clinically. If BP starts to drop, recheck a limited echo for signs of tamponade. Workup for inflammatory causes currently confounded by COVID-19 infection. 6. Fe def anemia:labs confirm Fe def - menometrorrhagia (most likely) vs GI etiology. 7. ONG:EXBMWUXL w diuresis. BUN/creat stabilized at 30s/1.13 range. 8. Pelvic mass 9. Suspected vascular dementia/Paranoid ideation:complicates treatment approach; need to take this into consideration when deciding on long term meds and discussing aggressive therapies such as ICD.      For questions or updates, please contact [place] Please consult [email or web address] for contact info under        Signed, Sanda Klein, MD  01/18/2021, 8:02 AM

## 2021-01-18 NOTE — Progress Notes (Signed)
RN remains at bedside. EKG done. Pt remains in 160s in SVT. BP 118/91

## 2021-01-18 NOTE — Progress Notes (Signed)
   01/18/21 0540 01/18/21 0550  Vitals  BP (!) 136/100 (!) 118/91  MAP (mmHg) 111 100  ECG Heart Rate (!) 169 (!) 166  Resp (!) 25 16  MEWS COLOR  MEWS Score Color Red Yellow  Oxygen Therapy  SpO2 97 % 99 %

## 2021-01-19 DIAGNOSIS — I471 Supraventricular tachycardia: Secondary | ICD-10-CM | POA: Diagnosis not present

## 2021-01-19 DIAGNOSIS — I5043 Acute on chronic combined systolic (congestive) and diastolic (congestive) heart failure: Secondary | ICD-10-CM | POA: Diagnosis not present

## 2021-01-19 DIAGNOSIS — U071 COVID-19: Secondary | ICD-10-CM | POA: Diagnosis not present

## 2021-01-19 DIAGNOSIS — N179 Acute kidney failure, unspecified: Secondary | ICD-10-CM | POA: Diagnosis not present

## 2021-01-19 DIAGNOSIS — I5082 Biventricular heart failure: Secondary | ICD-10-CM | POA: Diagnosis not present

## 2021-01-19 DIAGNOSIS — J1282 Pneumonia due to coronavirus disease 2019: Secondary | ICD-10-CM | POA: Diagnosis not present

## 2021-01-19 LAB — BASIC METABOLIC PANEL
Anion gap: 14 (ref 5–15)
BUN: 35 mg/dL — ABNORMAL HIGH (ref 6–20)
CO2: 28 mmol/L (ref 22–32)
Calcium: 9 mg/dL (ref 8.9–10.3)
Chloride: 97 mmol/L — ABNORMAL LOW (ref 98–111)
Creatinine, Ser: 1.28 mg/dL — ABNORMAL HIGH (ref 0.44–1.00)
GFR, Estimated: 52 mL/min — ABNORMAL LOW (ref >60)
Glucose, Bld: 83 mg/dL (ref 70–99)
Potassium: 3.9 mmol/L (ref 3.5–5.1)
Sodium: 139 mmol/L (ref 135–145)

## 2021-01-19 LAB — CBC
HCT: 30.9 % — ABNORMAL LOW (ref 36.0–46.0)
Hemoglobin: 8.4 g/dL — ABNORMAL LOW (ref 12.0–15.0)
MCH: 18.1 pg — ABNORMAL LOW (ref 26.0–34.0)
MCHC: 27.2 g/dL — ABNORMAL LOW (ref 30.0–36.0)
MCV: 66.7 fL — ABNORMAL LOW (ref 80.0–100.0)
Platelets: 183 10*3/uL (ref 150–400)
RBC: 4.63 MIL/uL (ref 3.87–5.11)
RDW: 25.2 % — ABNORMAL HIGH (ref 11.5–15.5)
WBC: 9.7 10*3/uL (ref 4.0–10.5)
nRBC: 0.9 % — ABNORMAL HIGH (ref 0.0–0.2)

## 2021-01-19 LAB — MAGNESIUM: Magnesium: 1.8 mg/dL (ref 1.7–2.4)

## 2021-01-19 MED ORDER — ARIPIPRAZOLE 2 MG PO TABS
2.0000 mg | ORAL_TABLET | Freq: Every day | ORAL | Status: DC
  Administered 2021-01-19 – 2021-01-26 (×8): 2 mg via ORAL
  Filled 2021-01-19 (×9): qty 1

## 2021-01-19 MED ORDER — LOSARTAN POTASSIUM 25 MG PO TABS
25.0000 mg | ORAL_TABLET | Freq: Every day | ORAL | Status: DC
  Administered 2021-01-19 – 2021-01-20 (×2): 25 mg via ORAL
  Filled 2021-01-19 (×2): qty 1

## 2021-01-19 MED ORDER — FUROSEMIDE 40 MG PO TABS
40.0000 mg | ORAL_TABLET | Freq: Every day | ORAL | Status: DC
  Administered 2021-01-20: 40 mg via ORAL
  Filled 2021-01-19: qty 1

## 2021-01-19 MED ORDER — POTASSIUM CHLORIDE 20 MEQ PO PACK
20.0000 meq | PACK | Freq: Once | ORAL | Status: AC
  Administered 2021-01-19: 20 meq via ORAL
  Filled 2021-01-19: qty 1

## 2021-01-19 NOTE — TOC Progression Note (Signed)
Transition of Care [facility]) - Progression Note    Patient Details  Name: Holly Bradley MRN: [id number] Date of Birth: 1976-01-03  Transition of Care [facility]) CM/SW [place], [place] Phone Number: 01/19/2021, 4:40 PM  Clinical Narrative:     CSW sent referral for inpatient psych for 01/20/21 to [facility]. TOC to follow up about referral.   CSW Faxed referral to [place], [facility]. WVP#[id number] Phone# [phone]  Expected Discharge Plan: Home/Self Care Barriers to Discharge: Financial Resources  Expected Discharge Plan and Services Expected Discharge Plan: Home/Self Care   Discharge Planning Services: CM Consult Post Acute Care Choice: NA Living arrangements for the past 2 months: Single Family Home                   DME Agency: NA       HH Arranged: NA           Social Determinants of Health (SDOH) Interventions    Readmission Risk Interventions No flowsheet data found.

## 2021-01-19 NOTE — Progress Notes (Signed)
   01/18/21 1930  Assess: MEWS Score  BP 120/89  ECG Heart Rate (!) 163  Resp 18  Level of Consciousness Alert  SpO2 94 %  O2 Device Room Air  Assess: MEWS Score  MEWS Temp 0  MEWS Systolic 0  MEWS Pulse 3  MEWS RR 0  MEWS LOC 0  MEWS Score 3  MEWS Score Color Yellow  Assess: if the MEWS score is Yellow or Red  Were vital signs taken at a resting state? Yes  Focused Assessment No change from prior assessment  Early Detection of Sepsis Score *See Row Information* Low  MEWS guidelines implemented *See Row Information* No, previously yellow, continue vital signs every 4 hours  Treat  MEWS Interventions Other (Comment) (Contacted MD)  Pain Scale 0-10  Pain Score 0  Escalate  MEWS: Escalate Yellow: discuss with charge nurse/RN and consider discussing with provider and RRT  Notify: Charge Nurse/RN  Name of Charge Nurse/RN Notified Jequetta RN  Date Charge Nurse/RN Notified 01/19/21  Time Charge Nurse/RN Notified 1935  Notify: Provider  Provider Name/Title Croitoru, Dani Gobble MD  Date Provider Notified 01/19/21  Time Provider Notified 1930  Notification Type Page  Notification Reason Change in status  Response See new orders  Date of Provider Response 01/19/21  Time of Provider Response 1945  Document  Patient Outcome Stabilized after interventions  Progress note created (see row info) Yes

## 2021-01-19 NOTE — Progress Notes (Signed)
Subjective:   1930: Patient went back into SVT with conversion to normal sinus rhythm following 6mg  then 12mg  of adenosine. Patient was subsequently given 5mg  IV metoprolol tartrate for prevention.  This morning, patient reports that she feels "okay." She reports feeling cold this morning, however she does not have her blankets covering her. She reports feeling much better when her sheet and cover are placed on her. She otherwise denies chest pain or shortness of breath. She has no further questions or concerns for our team.  Objective: Vital signs in last 24 hours: Vitals:   01/19/21 0320 01/19/21 0517 01/19/21 0737 01/19/21 0817  BP: (!) 144/76  (!) 141/114 (!) 141/94  Pulse:    (!) 112  Resp: 20  18   Temp: 98.7 F (37.1 C)  97.9 F (36.6 C)   TempSrc: Oral  Oral   SpO2: 99%     Weight:  56.6 kg    Height:      On room air  Intake/Output Summary (Last 24 hours) at 01/19/2021 1206 Last data filed at 01/19/2021 0907 Gross per 24 hour  Intake 480 ml  Output 2301 ml  Net -1821 ml  Cumulative Net: -11,668.59mL  Weights (in kg) since admission: 56.6 most recently, 57.3, 56.9, 59.4, 61.1, 60.5, 60.8, 64.5, 56.7  Physical Exam Vitals and nursing note reviewed.  Constitutional:      General: She is not in acute distress.    Appearance: She is ill-appearing.  Neck:     Comments: JVD to mandible Cardiovascular:     Rate and Rhythm: Regular rhythm. Tachycardia present.     Pulses: Normal pulses.     Heart sounds: Normal heart sounds.  Pulmonary:     Effort: Pulmonary effort is normal. No respiratory distress.     Breath sounds: Normal breath sounds.  Abdominal:     Tenderness: There is no abdominal tenderness.     Comments: Large, round, firm, palpable mass extending from suprapubic region to mid abdomen  Musculoskeletal:     Right lower leg: No edema.     Left lower leg: No edema.  Neurological:     General: No focal deficit present.     Mental Status: Mental status is  at baseline.   Labs in last 24 hours: CBC Latest Ref Rng & Units 01/19/2021 01/18/2021 01/17/2021  WBC 4.0 - 10.5 K/uL 9.7 7.5 7.1  Hemoglobin 12.0 - 15.0 g/dL 8.4(L) 7.8(L) 8.0(L)  Hematocrit 36.0 - 46.0 % 30.9(L) 27.4(L) 29.4(L)  Platelets 150 - 400 K/uL 183 167 176   BMP Latest Ref Rng & Units 01/19/2021 01/18/2021 01/17/2021  Glucose 70 - 99 mg/dL 83 89 120(H)  BUN 6 - 20 mg/dL 35(H) 32(H) 33(H)  Creatinine 0.44 - 1.00 mg/dL 1.28(H) 1.13(H) 1.15(H)  Sodium 135 - 145 mmol/L 139 135 136  Potassium 3.5 - 5.1 mmol/L 3.9 3.9 3.4(L)  Chloride 98 - 111 mmol/L 97(L) 94(L) 96(L)  CO2 22 - 32 mmol/L 28 29 28   Calcium 8.9 - 10.3 mg/dL 9.0 8.8(L) 9.0  Magnesium - 1.8  Imaging in last 24 hours: No results found.  Assessment/Plan: Principal Problem:   Acute exacerbation of CHF (congestive heart failure) (HCC) Active Problems:   CHF (congestive heart failure) ([place])   Pneumonia due to COVID-19 virus   Acute renal failure (HCC)   Anemia   Pelvic mass in female   Hypertensive emergency   Fibroid   Menorrhagia   Bilateral leg edema   Delirium  Holly Bradley  is a 45 year old female with past medical history significant for HTN, combined systolic and diastolic heart failure, paroxysmal SVT, and massive uterine fibroids complicated by menometrorrhagia and chronic blood loss anemia who presented to [facility] on 01/10/21 for evaluation of weaknessfound to be in SVT, acute on chronic biventricular heart failure,hypertensive emergency,AKI, COVID-19 infection and 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  #Biventricular heart failure, chronic #Moderate pericardial effusion, chronic #Hypertension, chronic During prior hospitalization in May, patient noted to have EF of 40-45% with grade 2 diastolic dysfunction. Patient was lost to follow-up and nonadherent to prescribed medications  following discharge. BNP on admission elevated to >4500 with echo revealing LVEF of 20-25%, moderate RV dysfunction, moderate pulmonary arterial hypertension, moderate pericardial effusion and elevated right atrial pressure. Weight down from 61.1kg on 01/23 to 56.6kg today. Net negative 11.7L since admission with negative 1.8L in past 24 hours. 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 Patient's beta-blocker being titrated up by cardiology. -Cardiology following, appreciate recommendations:  -Start metoprolol 200mg  XL daily  #Paranoia and psychosis, active Current psychiatry recommendations are to discharge to inpatient psychiatry upon medical stabilization and bed availability. Patient started on quetiapine on 01/26, however patient noted to be significantly somnolent following administration, febrile to 100.4 and worsening of tachycardia (all of which are possible adverse effects of this medication). Patient did not receive this medication on 01/27. We will discontinue her quetiapine for now. -Psychiatry following, greatly appreciate recommendations  -Discharge to inpatient psychiatry when medically stable -Discontinue quetiapine 25mg  nightly for now  #Large uterine fibroids, chronic #Iron deficiency anemia 2/2 menometrorrhagia, chronic Patient noted to have 23cm pelvic mass on CT Abdomen/Pelvis stable from MRI in 2019. MRI from 2019 revealed compression of right common iliac artery & vein, likely compression of IVC and left common iliac vein as well,  and bladder. 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 Findings likely secondary to severely uncontrolled hypertension over many years. -Optimize medical conditions as listed above  #COVID-19 infection, active Patient tested positive for COVID-19 on 01/10/21, however she is saturating well on room air. Patient may break isolation on 01/21/21. -Airborne and contact precautions  #VTE ppx: enoxaparin 40mg  daily #IVF: None #Diet: Heart healthy #Code status: Full code #PT/OT recs: PT has signed off. No OT follow up, no equipment recommendations. #Dispo: Anticipated discharge to inpatient psychiatry  Cato Mulligan, MD 01/19/2021, 12:06 PM Pager: [phone] After 5pm on weekdays and 1pm on weekends: On Call Pager: [phone]

## 2021-01-19 NOTE — Progress Notes (Signed)
Progress Note  [name] Date of Encounter: 01/19/2021  [facility] Cardiologist: Glenetta Hew, MD   Subjective   No CV complaints. Tachycardia this AM (sinus). She has had one very brief NSVT and one 10-beat run of atrial tachycardia (ectopic, long RP, not AVNRT).  Inpatient Medications    Scheduled Meds: . enoxaparin (LOVENOX) injection  40 mg Subcutaneous Q24H  . furosemide  80 mg Oral Daily  . isosorbide-hydrALAZINE  2 tablet Oral TID  . metoprolol succinate  200 mg Oral Daily  . spironolactone  50 mg Oral Daily   Continuous Infusions:  PRN Meds: acetaminophen, mineral oil-hydrophilic petrolatum   Vital Signs    Vitals:   01/19/21 0320 01/19/21 0517 01/19/21 0737 01/19/21 0817  BP: (!) 144/76  (!) 141/114 (!) 141/94  Pulse:    (!) 112  Resp: 20  18   Temp: 98.7 F (37.1 C)  97.9 F (36.6 C)   TempSrc: Oral  Oral   SpO2: 99%     Weight:  56.6 kg    Height:        Intake/Output Summary (Last 24 hours) at 01/19/2021 1227 Last data filed at 01/19/2021 [id number] Gross per 24 hour  Intake 480 ml  Output 2301 ml  Net -1821 ml   Last 3 Weights 01/19/2021 01/18/2021 01/18/2021  Weight (lbs) 124 lb 12.5 oz 126 lb 5.2 oz (No Data)  Weight (kg) 56.6 kg 57.3 kg (No Data)      Telemetry    Sinus tachycardia - Personally Reviewed  ECG    No new tracing - Personally Reviewed  Physical Exam  Appears relaxed GEN: No acute distress.   Neck: No JVD Cardiac: RRR, no murmurs, rubs, or gallops.  Respiratory: Clear to auscultation bilaterally. GI: Soft, nontender, non-distended  MS: No edema; No deformity. Neuro:  Nonfocal  Psych: Normal affect   Labs    High Sensitivity Troponin:   Recent Labs  Lab 01/10/21 1449 01/10/21 1600 01/10/21 1927  TROPONINIHS 802* 922* 888*      Chemistry Recent Labs  Lab 01/17/21 0248 01/18/21 0100 01/19/21 0436  NA 136 135 139  K 3.4* 3.9 3.9  CL 96* 94* 97*  CO2 28 29 28   GLUCOSE 120* 89 83   BUN 33* 32* 35*  CREATININE 1.15* 1.13* 1.28*  CALCIUM 9.0 8.8* 9.0  PROT 6.5  --   --   ALBUMIN 2.9*  --   --   AST 65*  --   --   ALT 55*  --   --   ALKPHOS 124  --   --   BILITOT 1.0  --   --   GFRNONAA 59* >60 52*  ANIONGAP 12 12 14      Hematology Recent Labs  Lab 01/17/21 0248 01/18/21 0100 01/19/21 0436  WBC 7.1 7.5 9.7  RBC 4.53 4.22 4.63  HGB 8.0* 7.8* 8.4*  HCT 29.4* 27.4* 30.9*  MCV 64.9* 64.9* 66.7*  MCH 17.7* 18.5* 18.1*  MCHC 27.2* 28.5* 27.2*  RDW 21.1* 23.2* 25.2*  PLT 176 167 183    BNPNo results for input(s): BNP, PROBNP in the last 168 hours.   DDimer  Recent Labs  Lab 01/13/21 0427 01/14/21 0344  DDIMER 2.36* 1.92*     Radiology    No results found.  Cardiac Studies    ECHO 01/11/2021  1. Left ventricular ejection fraction, by estimation, is 20 to 25%. The  left ventricle has severely decreased function. The left ventricle  demonstrates global hypokinesis. The left ventricular internal cavity size  was mildly to moderately dilated. There  is moderate concentric left ventricular hypertrophy. Left ventricular  diastolic parameters are consistent with Grade II diastolic dysfunction  (pseudonormalization). Elevated left ventricular end-diastolic pressure.  The average left ventricular global  longitudinal strain is -5.8 %. The global longitudinal strain is abnormal.  2. Right ventricular systolic function is moderately reduced. The right  ventricular size is normal. There is moderately elevated pulmonary artery  systolic pressure.  3. Left atrial size was mildly dilated.  4. Right atrial size was mildly dilated.  5. Moderate pericardial effusion. The pericardial effusion is  circumferential. There is no evidence of cardiac tamponade.  6. The mitral valve is normal in structure. Mild mitral valve  regurgitation. No evidence of mitral stenosis.  7. The aortic valve is tricuspid. Aortic valve regurgitation is not  visualized.  No aortic stenosis is present.  8. The inferior vena cava is dilated in size with <50% respiratory  variability, suggesting right atrial pressure of 15 mmHg.    ECHO 05/02/2020  1. Left ventricular ejection fraction, by estimation, is 40 to 45%. The  left ventricle has mildly decreased function. The left ventricle  demonstrates global hypokinesis. There is mild concentric left ventricular  hypertrophy. Left ventricular diastolic  parameters are consistent with Grade II diastolic dysfunction  (pseudonormalization). Elevated left atrial pressure.  2. Right ventricular systolic function is low normal. The right  ventricular size is normal. There is moderately elevated pulmonary artery  systolic pressure.  3. Left atrial size was mildly dilated.  4. Right atrial size was mildly dilated.  5. Moderate pericardial effusion. The pericardial effusion is  circumferential. There is no evidence of cardiac tamponade.  6. The mitral valve is normal in structure. No evidence of mitral valve  regurgitation.  7. Tricuspid valve regurgitation is mild to moderate.  8. The aortic valve is normal in structure. Aortic valve regurgitation is  not visualized.  9. The inferior vena cava is dilated in size with <50% respiratory  variability, suggesting right atrial pressure of 15 mmHg  Patient Profile     45 y.o. female with acute on chronic systolic and diastolic HF with worsening and severely depressed LVEF, presenting with HF exacerbation and recurrent SVT in the setting of COVID-19 infection, anemia and newly diagnosed pelvic mass w bilateral ureteral compression and hydronephrosis.  Assessment & Plan     1. CHF:on high dose BiDil + metoprolol + spironolactone and loop diuretic. In/out fluids and K level balanced. Add ARB today. Cause of CMP uncertain, but uncontrolled HTN likely at least contributory. Suspect nonischemic etiology. Not a candidate for invasive coronary evaluation. 2.  NSVT:occasional, asymptomatic.Keep K >4. 3. SVT (AV node reentry):narrow QRS short RP tachycardia with retrograde P wave on tail of QRS, promptly responsive to adenosine.First day of metoprolol 200 mg today. Consider adding digoxin if still has breakthrough events. She is not inclined to have invasive procedures such as RF ablation. 4. HTN:DBP remains high. Add ARB today. 5. Moderate pericardial effusion:seen on May 2021 echo, unchanged. No signs of tamponade clinically.If BP starts to drop, recheck a limited echo for signs of tamponade.Workup for inflammatory causes currently confounded by COVID-19 infection. 6. Fe def anemia:labs confirm Fe def - menometrorrhagia (most likely) vs GI etiology. 7. UXN:ATFTDDUKG diuresis. Slight bump in creatinine today. Reduce the daily diuretic dose. Watch daily after adding ARB. 8. Pelvic mass 9.Suspected vascular dementia/Paranoid ideation:complicates treatment approach;need to take this into consideration when  deciding on long term meds and discussing aggressive therapies such as ICD.      For questions or updates, please contact [place] Please consult [email or web address] for contact info under        Signed, Sanda Klein, MD  01/19/2021, 12:27 PM

## 2021-01-19 NOTE — Consult Note (Signed)
Received call from primary medical team regarding medication adjustment. Patient having worsening side effects since initiation of Seroquel to include somnolence and tachycardia (SVT). Patient did present with SVT on admission, and it is unclear if Seroquel caused another episode of SVT. However Seroquel does have side effects of tachycardia.  EKG obtianed today shows QTc of 420. Will dc Seroquel at this time and start Abilify 2mg  po daily. Will continue to recommend inpatient. Once patient is medically stable patient will need inpatient admission. Her team has been advised that [facility] has a Covid unit and patient does not have wait until she out of quarantine, she just needs to be medically stable.  -Will continue to recommend inpatient psychiatric admission, once medically cleared. See original note on 01/17/2021.  -Will place new order for ABilify 2mg  po daily for psychosis and paranoia.  -Psychiatry to sign off at this time.

## 2021-01-19 NOTE — Progress Notes (Signed)
Heart Failure Stewardship Pharmacist Progress Note   PCP: Patient, No Pcp Per PCP-Cardiologist: Glenetta Hew, MD    HPI:  45 yo F with PMH of chronic systolic and diastolic HF, HTN, uterine fibroids, and history of SVT. She presented to the ED for evaluation of weakness, and was found to have SVT, acute on chronic heart failure, hypertensive emergency, AKI, and a COVID-19 infection. An ECHO was done on 01/11/21 and LVEF severely decreased to 20-25% (down from 40-45% in May 2021).  Current HF Medications: Furosemide 80 mg daily Metoprolol XL 200 mg daily  Spironolactone 50 mg daily BiDil 20/37.5 mg 2 tabs TID  Prior to admission HF Medications: None - not taking any medications  Pertinent Lab Values: . Serum creatinine 1.28, BUN 35, Potassium 3.9, Sodium 139, BNP >4500, Magnesium 1.8  Vital Signs: . Weight: 124 lbs (admission weight: 142 lbs) . Blood pressure: 140/70-100s  . Heart rate: 90-110s - required additional adenosine last evening  Medication Assistance / Insurance Benefits Check: Does the patient have prescription insurance?  Yes Type of insurance plan: Medicaid (out-of-state)   Outpatient Pharmacy:  Prior to admission outpatient pharmacy: [facility] Is the patient willing to use [facility] at discharge? Yes   Assessment: 1. Acute on chronic systolic CHF (EF 41-66%), likely secondary to medication noncompliance, SVT, and COVID-19 infection. NYHA class III symptoms. - Continue furosemide 80 mg daily - Continue metoprolol XL 200 mg daily - No ARB/Entresto due to high likelihood for medication noncompliance - consider starting since metoprolol has been consolidated to once per day - Continue spironolactone 50 mg daily - dose increased due to hypokalemia despite aggressive potassium replacement per IMTS - Continue BiDil 20/27.5 mg 2 tabs TID   Plan: 1) Medication changes recommended at this time: - Continue current regimen, metoprolol transitioned to XL today  2)  Patient assistance application(s): - None pending  3)  Education  - To be completed prior to discharge - Appreciate assistance from [facility] [place], PharmD, BCPS Heart Failure Cytogeneticist Phone [phone]

## 2021-01-20 DIAGNOSIS — N179 Acute kidney failure, unspecified: Secondary | ICD-10-CM | POA: Diagnosis not present

## 2021-01-20 DIAGNOSIS — D649 Anemia, unspecified: Secondary | ICD-10-CM | POA: Diagnosis not present

## 2021-01-20 DIAGNOSIS — D219 Benign neoplasm of connective and other soft tissue, unspecified: Secondary | ICD-10-CM | POA: Diagnosis not present

## 2021-01-20 DIAGNOSIS — I5043 Acute on chronic combined systolic (congestive) and diastolic (congestive) heart failure: Secondary | ICD-10-CM | POA: Diagnosis not present

## 2021-01-20 LAB — BASIC METABOLIC PANEL
Anion gap: 15 (ref 5–15)
BUN: 35 mg/dL — ABNORMAL HIGH (ref 6–20)
CO2: 26 mmol/L (ref 22–32)
Calcium: 9.3 mg/dL (ref 8.9–10.3)
Chloride: 94 mmol/L — ABNORMAL LOW (ref 98–111)
Creatinine, Ser: 1.31 mg/dL — ABNORMAL HIGH (ref 0.44–1.00)
GFR, Estimated: 51 mL/min — ABNORMAL LOW (ref >60)
Glucose, Bld: 82 mg/dL (ref 70–99)
Potassium: 4.8 mmol/L (ref 3.5–5.1)
Sodium: 135 mmol/L (ref 135–145)

## 2021-01-20 LAB — CBC
HCT: 33 % — ABNORMAL LOW (ref 36.0–46.0)
Hemoglobin: 8.9 g/dL — ABNORMAL LOW (ref 12.0–15.0)
MCH: 18.3 pg — ABNORMAL LOW (ref 26.0–34.0)
MCHC: 27 g/dL — ABNORMAL LOW (ref 30.0–36.0)
MCV: 67.8 fL — ABNORMAL LOW (ref 80.0–100.0)
Platelets: 181 10*3/uL (ref 150–400)
RBC: 4.87 MIL/uL (ref 3.87–5.11)
RDW: 26.9 % — ABNORMAL HIGH (ref 11.5–15.5)
WBC: 9.3 10*3/uL (ref 4.0–10.5)
nRBC: 0.4 % — ABNORMAL HIGH (ref 0.0–0.2)

## 2021-01-20 LAB — MAGNESIUM: Magnesium: 1.9 mg/dL (ref 1.7–2.4)

## 2021-01-20 NOTE — Progress Notes (Signed)
Creatinine stable with starting losartan yesterday.  BP 141/82 this morning.  Continue current heart failure regimen of BiDil, Toprol-XL, spironolactone, losartan, and Lasix.  Telemetry reviewed, sinus rhythm with rate 90s to 100s.  2 episodes of NSVT, lasting 8 and 15 beats.  Continue Toprol-XL 200 mg.  Maintain K > 4, mag >2.  Donato Heinz, MD

## 2021-01-20 NOTE — Progress Notes (Signed)
Subjective:   She is feeling ok this morning. She has no shortness of breath or palpitations. She feels like her stomach is slightly off but doesn't hurt. Eating apple sauce currently.   Objective:  Vital signs in last 24 hours: Vitals:   01/19/21 1714 01/19/21 2000 01/20/21 0000 01/20/21 0400  BP: 138/85 109/75 106/60 117/76  Pulse:    100  Resp: 19 20 18    Temp: 99 F (37.2 C)  98.6 F (37 C) 98.8 F (37.1 C)  TempSrc: Oral  Oral Oral  SpO2:   98% 100%  Weight:    56.2 kg  Height:        Constitution: NAD, appears stated age HENT: [place]/AT Eyes: no icterus or injection  Cardio: tachycardic, sinus rhythm, no m/r/g, no LE edema  Respiratory: lungs clear to auscultation  Abdominal: large firm mass, abdomen distended, NTTP  MSK: moving all extremities Neuro: orientedx3, at baseline mental status with some mild confusion, decreased paranoia today, pleasant  Skin: c/d/i   Assessment/Plan:  Principal Problem:   Acute exacerbation of CHF (congestive heart failure) (HCC) Active Problems:   CHF (congestive heart failure) (HCC)   Pneumonia due to COVID-19 virus   Acute renal failure (HCC)   Anemia   Pelvic mass in female   Hypertensive emergency   Fibroid   Menorrhagia   Bilateral leg edema   Delirium  Holly Bradley is a 45 year old female with past medical history significant for HTN, combined systolic and diastolic heart failure, paroxysmal SVT, and massive uterine fibroids complicated by menometrorrhagia and chronic blood loss anemia who presented to [facility] on 01/10/21 for evaluation of weaknessfound to be in SVT, acute on chronic biventricular heart failure,hypertensive emergency,AKI, COVID-19 infection and 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  #Biventricular heart failure, chronic #Moderate pericardial effusion, chronic #Hypertension,  chronic EF of 40-45% in 5/21. Patient was lost to follow-up and nonadherent to prescribed medications following discharge. Now LVEF of 20-25%, moderate RV dysfunction, moderate PAH, moderate pericardial effusion. Diuresed ~19lbs. Net negative 11L. Positive 780cc overnight on lasix 80 qd po. Worsened renal function yesterday and today, started cozaar yesterday and this is reasonable decrease in Cr with initiation, appears euvolemic on exam.  -cont. lasix 40 mg today once  -Cardiology following, appreciate recommendations -Continue two tablets of isosorbide-hydralazine 20-37.5mg  three times daily -Metoprolol 200mg  XL daily - consider increasing with ongoing tachycardia.   -already received spironolactone today, will f/u Cr tomorrow and hold if worsening  - cozaar 25 mg qd added yesterday - cont. Today  -Strict intake/output -Daily weights   #AVNRT,recurrent  #Non-sustained ventricular tachycardia, recurrent Initially improved after starting beta blocker. Occurred x2 after starting seroquel, possibly induced tachycardia. No recurrence since night of 1/27.  -stop seroquel  -cont. Metop 200mg  xl qd  -cardiology consulted, appreciate recs   #Paranoia and psychosis, active Plan to d/c to inpatient psych. Seroquel held due to increased tachycardia and recurrence of AVNRT -Psychiatry following, greatly appreciate recommendations -Discharge to inpatient today or tomorrow  -start abilify 2mg  qd   #Large uterine fibroids, chronic #Iron deficiency anemia2/2 menometrorrhagia, chronic Patient noted to have 23cm pelvic mass on CT Abdomen/Pelvis stable from MRI in 2019. MRI from 2019 revealed compression of right common iliac artery & vein, likely compression of IVC and left common iliac vein as well, and bladder. 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 Findings likely secondary to severely uncontrolled hypertension over many years. -Optimize medical conditions as listed above  #COVID-19 infection, active Patient tested positive for COVID-19 on 01/10/21, however she is saturatingwellon room air. Patient may break isolation on 01/21/21. -Airborne and contact precautions  #VTE ppx: enoxaparin 40mg  daily #IVF: None #Diet: Heart healthy #Code status: Full code #PT/OT recs: PT has signed off. No OT follow up, no equipment recommendations. #Dispo: Anticipated discharge to inpatient psychiatry  Dispo: Anticipated discharge in 1-2 days.   Angle Dirusso A, DO 01/20/2021, 6:25 AM Pager: [phone] After 5pm on weekdays and 1pm on weekends: On Call Pager: [phone]

## 2021-01-21 DIAGNOSIS — R410 Disorientation, unspecified: Secondary | ICD-10-CM

## 2021-01-21 DIAGNOSIS — N179 Acute kidney failure, unspecified: Secondary | ICD-10-CM | POA: Diagnosis not present

## 2021-01-21 DIAGNOSIS — D219 Benign neoplasm of connective and other soft tissue, unspecified: Secondary | ICD-10-CM | POA: Diagnosis not present

## 2021-01-21 DIAGNOSIS — I5043 Acute on chronic combined systolic (congestive) and diastolic (congestive) heart failure: Secondary | ICD-10-CM | POA: Diagnosis not present

## 2021-01-21 DIAGNOSIS — R1900 Intra-abdominal and pelvic swelling, mass and lump, unspecified site: Secondary | ICD-10-CM | POA: Diagnosis not present

## 2021-01-21 LAB — BASIC METABOLIC PANEL
Anion gap: 15 (ref 5–15)
BUN: 33 mg/dL — ABNORMAL HIGH (ref 6–20)
CO2: 26 mmol/L (ref 22–32)
Calcium: 9 mg/dL (ref 8.9–10.3)
Chloride: 92 mmol/L — ABNORMAL LOW (ref 98–111)
Creatinine, Ser: 1.48 mg/dL — ABNORMAL HIGH (ref 0.44–1.00)
GFR, Estimated: 44 mL/min — ABNORMAL LOW (ref >60)
Glucose, Bld: 87 mg/dL (ref 70–99)
Potassium: 4.4 mmol/L (ref 3.5–5.1)
Sodium: 133 mmol/L — ABNORMAL LOW (ref 135–145)

## 2021-01-21 LAB — MAGNESIUM: Magnesium: 1.9 mg/dL (ref 1.7–2.4)

## 2021-01-21 LAB — CBC
HCT: 29.6 % — ABNORMAL LOW (ref 36.0–46.0)
Hemoglobin: 8.4 g/dL — ABNORMAL LOW (ref 12.0–15.0)
MCH: 18.9 pg — ABNORMAL LOW (ref 26.0–34.0)
MCHC: 28.4 g/dL — ABNORMAL LOW (ref 30.0–36.0)
MCV: 66.7 fL — ABNORMAL LOW (ref 80.0–100.0)
Platelets: 213 10*3/uL (ref 150–400)
RBC: 4.44 MIL/uL (ref 3.87–5.11)
RDW: 27.7 % — ABNORMAL HIGH (ref 11.5–15.5)
WBC: 10.4 10*3/uL (ref 4.0–10.5)
nRBC: 0.3 % — ABNORMAL HIGH (ref 0.0–0.2)

## 2021-01-21 MED ORDER — ADENOSINE 6 MG/2ML IV SOLN
INTRAVENOUS | Status: AC
  Administered 2021-01-21: 6 mg
  Filled 2021-01-21: qty 6

## 2021-01-21 MED ORDER — METOPROLOL TARTRATE 5 MG/5ML IV SOLN: 5.0000 mg | Freq: Once | INTRAVENOUS | Status: AC

## 2021-01-21 MED ORDER — METOPROLOL TARTRATE 5 MG/5ML IV SOLN
INTRAVENOUS | Status: AC
  Administered 2021-01-22: 5 mg via INTRAVENOUS
  Filled 2021-01-21: qty 5

## 2021-01-21 MED ORDER — DILTIAZEM HCL 25 MG/5ML IV SOLN
10.0000 mg | Freq: Once | INTRAVENOUS | Status: DC
  Filled 2021-01-21: qty 5

## 2021-01-21 MED ORDER — ADENOSINE 6 MG/2ML IV SOLN
12.0000 mg | Freq: Once | INTRAVENOUS | Status: AC
  Administered 2021-01-21: 12 mg via INTRAVENOUS

## 2021-01-21 NOTE — Progress Notes (Signed)
   01/21/21 0000  Assess: MEWS Score  BP (!) 99/53  Pulse Rate (!) 102  ECG Heart Rate (!) 102  Assess: MEWS Score  MEWS Temp 0  MEWS Systolic 1  MEWS Pulse 1  MEWS RR 0  MEWS LOC 0  MEWS Score 2  MEWS Score Color Yellow  Assess: if the MEWS score is Yellow or Red  Were vital signs taken at a resting state? Yes  Focused Assessment No change from prior assessment  Early Detection of Sepsis Score *See Row Information* Low  MEWS guidelines implemented *See Row Information* No, other (Comment) (BP is low at times)  Document  Patient Outcome Stabilized after interventions  Progress note created (see row info) Yes

## 2021-01-21 NOTE — Hospital Course (Signed)
BV heart failure EF 20-25% Moderate pericardial effusion Moderate PAH Hx of Hypertension Hypotension Recurrent AVNRT, non-sustained VTACH -poor I/O documentation -not compliant with meds outpatient -lasix, spiro, losartan d/c'd 1/30 due to worse renal function -management per cards  Paranoia/Psychosis--will d/c to inpatient psych  Large uterine fibroid with iron deficiency anemia  Compression of right illiac vein -gyn consulted--depo shot given 1/21 -f/u outpatient gyn

## 2021-01-21 NOTE — TOC Progression Note (Incomplete)
Transition of Care [facility]) - Progression Note    Patient Details  Name: Holly Bradley MRN: [id number] Date of Birth: 1976/07/18  Transition of [facility]) CM/SW [facility], [place] Phone Number: [phone] 01/21/2021, 12:17 PM  Clinical Narrative:   CSW called [place] to follow up on referral which was made 1/28. CSW was informed to check back when patient was medically stable for discharge due to census changing daily.      Expected Discharge Plan: Home/Self Care Barriers to Discharge: Financial Resources  Expected Discharge Plan and Services Expected Discharge Plan: Home/Self Care   Discharge Planning Services: CM Consult Post Acute Care Choice: NA Living arrangements for the past 2 months: Single Family Home                   DME Agency: NA       HH Arranged: NA           Social Determinants of Health (SDOH) Interventions    Readmission Risk Interventions No flowsheet data found.

## 2021-01-21 NOTE — Progress Notes (Addendum)
Progress Note  [name] Date of Encounter: 01/21/2021  [facility] Cardiologist: Glenetta Hew, MD   Subjective   BP 144/84 this AM.  Creatinine trending up (1.13 > 1.28 >1.31 >1.48).  Denies any dyspnea  Inpatient Medications    Scheduled Meds: . ARIPiprazole  2 mg Oral QHS  . enoxaparin (LOVENOX) injection  40 mg Subcutaneous Q24H  . isosorbide-hydrALAZINE  2 tablet Oral TID  . metoprolol succinate  200 mg Oral Daily   Continuous Infusions:  PRN Meds: acetaminophen, mineral oil-hydrophilic petrolatum   Vital Signs    Vitals:   01/20/21 2100 01/21/21 0000 01/21/21 0400 01/21/21 0833  BP:  (!) 99/53 (!) 144/84   Pulse:  (!) 102 (!) 106 (!) 113  Resp: 18  18 16   Temp: 97.7 F (36.5 C)  97.8 F (36.6 C) 98 F (36.7 C)  TempSrc: Oral  Oral Oral  SpO2: 97%  94% 100%  Weight:   55.7 kg   Height:        Intake/Output Summary (Last 24 hours) at 01/21/2021 1030 Last data filed at 01/21/2021 0835 Gross per 24 hour  Intake 160 ml  Output 1650 ml  Net -1490 ml   Last 3 Weights 01/21/2021 01/20/2021 01/19/2021  Weight (lbs) 122 lb 12.8 oz 123 lb 12.8 oz 124 lb 12.5 oz  Weight (kg) 55.702 kg 56.155 kg 56.6 kg      Telemetry    Sinus tachycardia - Personally Reviewed  ECG    No new tracing - Personally Reviewed  Physical Exam   GEN: No acute distress.   Neck: No JVD Cardiac: RRR, no murmurs, rubs, or gallops.  Respiratory: Clear to auscultation bilaterally. GI: Soft, nontender, non-distended  MS: No edema; No deformity. Neuro:  Nonfocal  Psych: Flat affect   Labs    High Sensitivity Troponin:   Recent Labs  Lab 01/10/21 1449 01/10/21 1600 01/10/21 1927  TROPONINIHS 802* 922* 888*      Chemistry Recent Labs  Lab 01/17/21 0248 01/18/21 0100 01/19/21 0436 01/20/21 0431 01/21/21 0139  NA 136   < > 139 135 133*  K 3.4*   < > 3.9 4.8 4.4  CL 96*   < > 97* 94* 92*  CO2 28   < > 28 26 26   GLUCOSE 120*   < > 83 82 87  BUN  33*   < > 35* 35* 33*  CREATININE 1.15*   < > 1.28* 1.31* 1.48*  CALCIUM 9.0   < > 9.0 9.3 9.0  PROT 6.5  --   --   --   --   ALBUMIN 2.9*  --   --   --   --   AST 65*  --   --   --   --   ALT 55*  --   --   --   --   ALKPHOS 124  --   --   --   --   BILITOT 1.0  --   --   --   --   GFRNONAA 59*   < > 52* 51* 44*  ANIONGAP 12   < > 14 15 15    < > = values in this interval not displayed.     Hematology Recent Labs  Lab 01/19/21 0436 01/20/21 0431 01/21/21 0139  WBC 9.7 9.3 10.4  RBC 4.63 4.87 4.44  HGB 8.4* 8.9* 8.4*  HCT 30.9* 33.0* 29.6*  MCV 66.7* 67.8* 66.7*  MCH 18.1*  18.3* 18.9*  MCHC 27.2* 27.0* 28.4*  RDW 25.2* 26.9* 27.7*  PLT 183 181 213    BNPNo results for input(s): BNP, PROBNP in the last 168 hours.   DDimer  No results for input(s): DDIMER in the last 168 hours.   Radiology    No results found.  Cardiac Studies    ECHO 01/11/2021  1. Left ventricular ejection fraction, by estimation, is 20 to 25%. The  left ventricle has severely decreased function. The left ventricle  demonstrates global hypokinesis. The left ventricular internal cavity size  was mildly to moderately dilated. There  is moderate concentric left ventricular hypertrophy. Left ventricular  diastolic parameters are consistent with Grade II diastolic dysfunction  (pseudonormalization). Elevated left ventricular end-diastolic pressure.  The average left ventricular global  longitudinal strain is -5.8 %. The global longitudinal strain is abnormal.  2. Right ventricular systolic function is moderately reduced. The right  ventricular size is normal. There is moderately elevated pulmonary artery  systolic pressure.  3. Left atrial size was mildly dilated.  4. Right atrial size was mildly dilated.  5. Moderate pericardial effusion. The pericardial effusion is  circumferential. There is no evidence of cardiac tamponade.  6. The mitral valve is normal in structure. Mild mitral  valve  regurgitation. No evidence of mitral stenosis.  7. The aortic valve is tricuspid. Aortic valve regurgitation is not  visualized. No aortic stenosis is present.  8. The inferior vena cava is dilated in size with <50% respiratory  variability, suggesting right atrial pressure of 15 mmHg.    ECHO 05/02/2020  1. Left ventricular ejection fraction, by estimation, is 40 to 45%. The  left ventricle has mildly decreased function. The left ventricle  demonstrates global hypokinesis. There is mild concentric left ventricular  hypertrophy. Left ventricular diastolic  parameters are consistent with Grade II diastolic dysfunction  (pseudonormalization). Elevated left atrial pressure.  2. Right ventricular systolic function is low normal. The right  ventricular size is normal. There is moderately elevated pulmonary artery  systolic pressure.  3. Left atrial size was mildly dilated.  4. Right atrial size was mildly dilated.  5. Moderate pericardial effusion. The pericardial effusion is  circumferential. There is no evidence of cardiac tamponade.  6. The mitral valve is normal in structure. No evidence of mitral valve  regurgitation.  7. Tricuspid valve regurgitation is mild to moderate.  8. The aortic valve is normal in structure. Aortic valve regurgitation is  not visualized.  9. The inferior vena cava is dilated in size with <50% respiratory  variability, suggesting right atrial pressure of 15 mmHg  Patient Profile     45 y.o. female with acute on chronic systolic and diastolic HF with worsening and severely depressed LVEF, presenting with HF exacerbation and recurrent SVT in the setting of COVID-19 infection, anemia and newly diagnosed pelvic mass w bilateral ureteral compression and hydronephrosis.  Assessment & Plan     1. CHF:on high dose BiDil + metoprolol + spironolactone + losartan and loop diuretic. In/out fluids and K level balanced.  Cause of CMP uncertain, but  uncontrolled HTN likely at least contributory. Suspect nonischemic etiology. Not a candidate for invasive coronary evaluation.  Given worsening renal function, would hold lasix and ARB for today 2. NSVT:occasional, asymptomatic.Keep K >4. 3. SVT (AV node reentry):narrow QRS short RP tachycardia with retrograde P wave on tail of QRS, promptly responsive to adenosine.On metoprolol 200 mg daily. Consider adding digoxin if still has breakthrough events. She is not inclined  to have invasive procedures such as RF ablation. 4. JKK:XFGHWEXH CHF meds as above 5. Moderate pericardial effusion:seen on May 2021 echo, unchanged. No signs of tamponade clinically.If BP starts to drop, recheck a limited echo for signs of tamponade.Workup for inflammatory causes currently confounded by COVID-19 infection. 6. Fe def anemia:labs confirm Fe def - menometrorrhagia (most likely) vs GI etiology. 7. BZJ:IRCVELFYB diuresis initially but now trending up.  Suspect overdiuresis, will hold lasix as above 8. Pelvic mass 9.Suspected vascular dementia/Paranoid ideation:complicates treatment approach;need to take this into consideration when deciding on long term meds and discussing aggressive therapies such as ICD.      For questions or updates, please contact [place] Please consult [email or web address] for contact info under        Signed, Donato Heinz, MD  01/21/2021, 10:30 AM

## 2021-01-21 NOTE — Progress Notes (Addendum)
Subjective:   Patient seen while sleeping sideways in bed. She states she is feeling tired this morning. She has no pain. She does have a little shortness of breath sometimes but does not feel like her legs are more swollen. She is still having frequent urination.   Objective:  Vital signs in last 24 hours: Vitals:   01/20/21 2000 01/20/21 2100 01/21/21 0000 01/21/21 0400  BP:   (!) 99/53 (!) 144/84  Pulse: (!) 102  (!) 102 (!) 106  Resp: 18 18  18   Temp:  97.7 F (36.5 C)  97.8 F (36.6 C)  TempSrc:  Oral  Oral  SpO2:  97%  94%  Weight:    55.7 kg  Height:        Constitution: NAD, chronically ill appearing HENT: Holly Bradley/AT Eyes: no icterus or injection  Cardio: tachycardic, sinus rhythm, no m/r/g, no LE edema  Respiratory: lungs clear to auscultation  Abdominal: large firm mass, abdomen distended, NTTP  MSK: moving all extremities Neuro: orientedx3, at baseline mental status with some mild confusion, decreased paranoia today, pleasant  Skin: c/d/i   Assessment/Plan:  Principal Problem:   Acute exacerbation of CHF (congestive heart failure) (HCC) Active Problems:   CHF (congestive heart failure) ([place])   Pneumonia due to COVID-19 virus   Acute renal failure (HCC)   Anemia   Pelvic mass in female   Hypertensive emergency   Fibroid   Menorrhagia   Bilateral leg edema   Delirium  Holly Bradley is a 45 year old female with past medical history significant for HTN, combined systolic and diastolic heart failure, paroxysmal SVT, and massive uterine fibroids complicated by menometrorrhagia and chronic blood loss anemia who presented to [facility] on [facility]/10[facility]21 for evaluation of weaknessfound to be in SVT, acute on chronic biventricular heart failure,hypertensive emergency,AKI, COVID-19 infection and 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  #Biventricular heart  failure, chronic #Moderate pericardial effusion, chronic #Hypertension, chronic EF of 40-45% in 5/21. Patient was lost to follow-up and nonadherent to prescribed medications following discharge. Now LVEF of 20-25%, moderate RV dysfunction, moderate PAH, moderate pericardial effusion.  Ins and outs do not seem accurate, but she has continued to lose weight with lasix 40 mg po, this is likely also related to poor po intake. Renal function has worsened over the last several days, partially normal Cr bump from starting cozaar, but also appears slightly overdiuresed.    -hold lasix today -hold spironolactone and losartan with worsening AKI -Cardiology following, appreciate recommendations -Continue two tablets of isosorbide-hydralazine 20-37.5mg  three times daily -Metoprolol 200mg  XL daily - consider increasing with ongoing tachycardia.  -already received spironolactone today, will f/u Cr tomorrow and hold if worsening   -Strict intake/output -Daily weights   #AVNRT,recurrent  #Non-sustained ventricular tachycardia, recurrent Initially improved after starting beta blocker. Occurred x2 after starting seroquel, possibly induced tachycardia. No recurrence since night of 1/27.  -stop seroquel  -cont. Metop 200mg  xl qd  -cardiology consulted, appreciate recs   #Paranoia and psychosis, active Plan to d/c to inpatient psych. Seroquel held due to increased tachycardia and recurrence of AVNRT -Psychiatry following, greatly appreciate recommendations -Discharge to inpatient today or tomorrow  -cont. abilify 2mg  qd   #Large uterine fibroids, chronic #Iron deficiency anemia2/2 menometrorrhagia, chronic Patient noted to have 23cm pelvic mass on CT Abdomen/Pelvis stable from MRI in 2019. MRI from 2019 revealed compression of right common iliac artery & vein, likely  compression of IVC and left common iliac vein as well, and bladder. 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 Findings likely secondary to severely uncontrolled hypertension over many years. -Optimize medical conditions as listed above  #COVID-19 infection, active covid cautions end today.   #VTE ppx: enoxaparin 40mg  daily #IVF: None #Diet: Heart healthy #Code status: Full code #PT/OT recs: PT has signed off. No OT follow up, no equipment recommendations. #Dispo: Anticipated discharge to inpatient psychiatry  Dispo: Anticipated discharge in 1-2 days.   Holly Bradley A, DO 01/21/2021, 6:43 AM Pager: [phone] After 5pm on weekdays and 1pm on weekends: On Call Pager: [phone]

## 2021-01-22 ENCOUNTER — Inpatient Hospital Stay (HOSPITAL_COMMUNITY): Payer: BLUE CROSS/BLUE SHIELD

## 2021-01-22 DIAGNOSIS — I429 Cardiomyopathy, unspecified: Secondary | ICD-10-CM

## 2021-01-22 DIAGNOSIS — I5043 Acute on chronic combined systolic (congestive) and diastolic (congestive) heart failure: Secondary | ICD-10-CM | POA: Diagnosis not present

## 2021-01-22 DIAGNOSIS — R509 Fever, unspecified: Secondary | ICD-10-CM

## 2021-01-22 DIAGNOSIS — I5023 Acute on chronic systolic (congestive) heart failure: Secondary | ICD-10-CM | POA: Diagnosis not present

## 2021-01-22 DIAGNOSIS — R1900 Intra-abdominal and pelvic swelling, mass and lump, unspecified site: Secondary | ICD-10-CM | POA: Diagnosis not present

## 2021-01-22 DIAGNOSIS — I161 Hypertensive emergency: Secondary | ICD-10-CM | POA: Diagnosis not present

## 2021-01-22 DIAGNOSIS — I472 Ventricular tachycardia: Secondary | ICD-10-CM | POA: Diagnosis not present

## 2021-01-22 DIAGNOSIS — I471 Supraventricular tachycardia: Secondary | ICD-10-CM | POA: Diagnosis not present

## 2021-01-22 DIAGNOSIS — N179 Acute kidney failure, unspecified: Secondary | ICD-10-CM | POA: Diagnosis not present

## 2021-01-22 LAB — BASIC METABOLIC PANEL
Anion gap: 16 — ABNORMAL HIGH (ref 5–15)
BUN: 36 mg/dL — ABNORMAL HIGH (ref 6–20)
CO2: 22 mmol/L (ref 22–32)
Calcium: 8.6 mg/dL — ABNORMAL LOW (ref 8.9–10.3)
Chloride: 93 mmol/L — ABNORMAL LOW (ref 98–111)
Creatinine, Ser: 1.37 mg/dL — ABNORMAL HIGH (ref 0.44–1.00)
GFR, Estimated: 48 mL/min — ABNORMAL LOW (ref >60)
Glucose, Bld: 72 mg/dL (ref 70–99)
Potassium: 5 mmol/L (ref 3.5–5.1)
Sodium: 131 mmol/L — ABNORMAL LOW (ref 135–145)

## 2021-01-22 LAB — MAGNESIUM: Magnesium: 2 mg/dL (ref 1.7–2.4)

## 2021-01-22 IMAGING — DX DG CHEST 1V PORT
1 series · 1 of 1 positions shown · non-contrast
Comparison: [DATE]

CLINICAL DATA: Midline catheter placement

EXAM:
PORTABLE CHEST 1 VIEW

[chest ap]
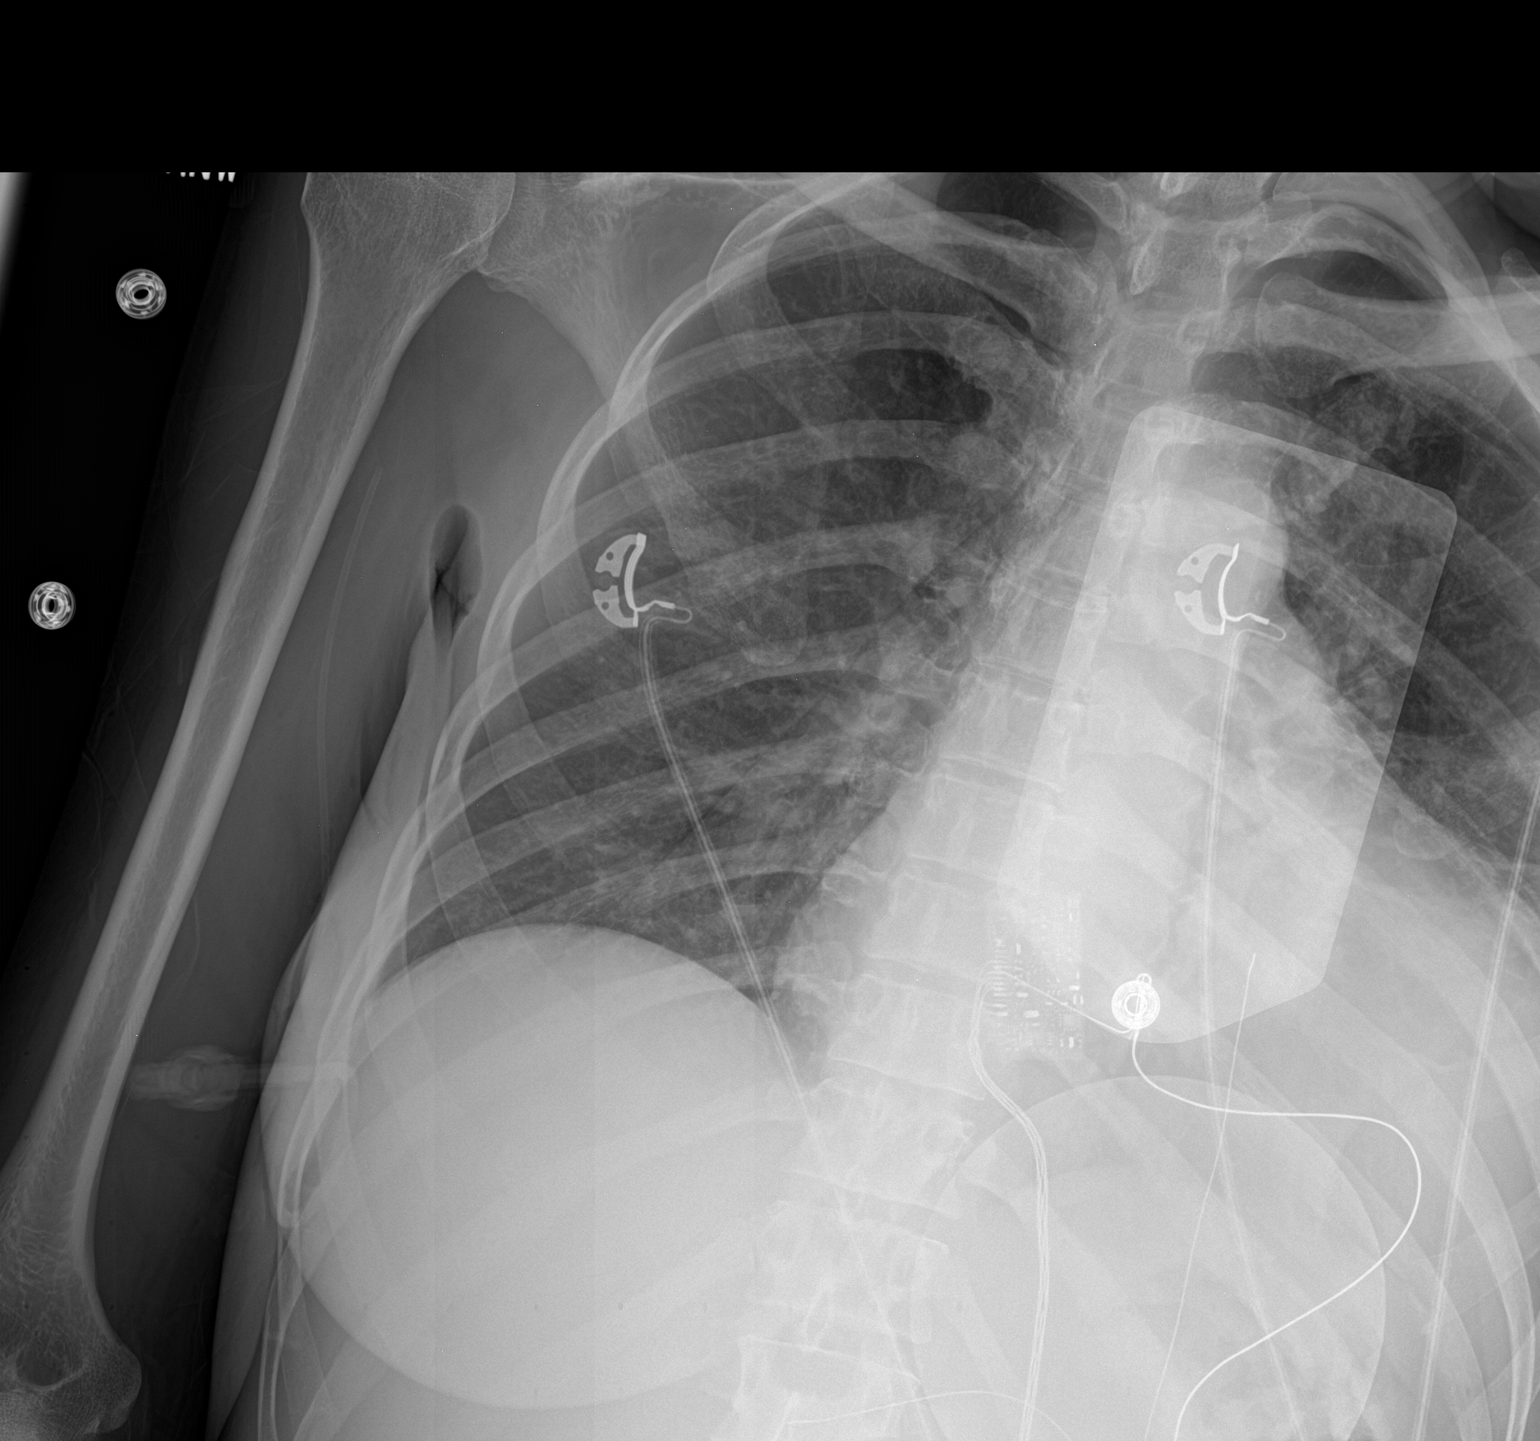

[1 of 1 positions shown; findings below may reference images not displayed]

FINDINGS: Right upper extremity midline catheter has been placed with its tip
overlying the expected brachial axillary junction. The left
hemithorax is partially excluded. Mild cardiomegaly is again noted.
Right lung is clear. No pneumothorax. No pleural effusion on the
right. Pulmonary vascularity is normal.
IMPRESSION: Right upper extremity midline catheter tip at the brachial axillary
junction.

## 2021-01-22 MED ORDER — ADENOSINE 6 MG/2ML IV SOLN
18.0000 mg | Freq: Once | INTRAVENOUS | Status: AC
  Administered 2021-01-21: 18 mg via INTRAVENOUS

## 2021-01-22 MED ORDER — AMIODARONE HCL 200 MG PO TABS
400.0000 mg | ORAL_TABLET | Freq: Two times a day (BID) | ORAL | Status: DC
  Administered 2021-01-22 – 2021-01-23 (×2): 400 mg via ORAL
  Filled 2021-01-22 (×2): qty 2

## 2021-01-22 MED ORDER — ADENOSINE 6 MG/2ML IV SOLN
INTRAVENOUS | Status: AC
  Administered 2021-01-22: 12 mg
  Filled 2021-01-22: qty 4

## 2021-01-22 NOTE — Plan of Care (Signed)
  Problem: Pain Managment: Goal: General experience of comfort will improve Outcome: Completed/Met   Problem: Skin Integrity: Goal: Risk for impaired skin integrity will decrease Outcome: Completed/Met

## 2021-01-22 NOTE — Progress Notes (Addendum)
Progress Note  [name] Date of Encounter: 01/22/2021  [facility] Cardiologist: Glenetta Hew, MD   Subjective   Feeling well.  SOB when in SVT.  Inpatient Medications    Scheduled Meds: . ARIPiprazole  2 mg Oral QHS  . enoxaparin (LOVENOX) injection  40 mg Subcutaneous Q24H  . isosorbide-hydrALAZINE  2 tablet Oral TID  . metoprolol succinate  200 mg Oral Daily   Continuous Infusions:  PRN Meds: acetaminophen, mineral oil-hydrophilic petrolatum   Vital Signs    Vitals:   01/22/21 0000 01/22/21 0027 01/22/21 0400 01/22/21 0600  BP: 106/68 119/74 130/79   Pulse:   96   Resp: (!) 21  19   Temp:   98.8 F (37.1 C)   TempSrc:   Oral   SpO2: 98%  97%   Weight:    55.8 kg  Height:        Intake/Output Summary (Last 24 hours) at 01/22/2021 0857 Last data filed at 01/22/2021 0848 Gross per 24 hour  Intake 100 ml  Output 675 ml  Net -575 ml   Last 3 Weights 01/22/2021 01/21/2021 01/20/2021  Weight (lbs) 123 lb 0.3 oz 122 lb 12.8 oz 123 lb 12.8 oz  Weight (kg) 55.8 kg 55.702 kg 56.155 kg      Telemetry    Sinus rhythm.  Another episode of SVT yesterday - Personally Reviewed  ECG    01/21/21: SVT.  Rate 163 bpm.   - Personally Reviewed  Physical Exam   VS:  BP 130/79 (BP Location: Left Arm)   Pulse 96   Temp 98.8 F (37.1 C) (Oral)   Resp 19   Ht 5\' 6"  (1.676 m)   Wt 55.8 kg   SpO2 97%   BMI 19.86 kg/m  , BMI Body mass index is 19.86 kg/m. GENERAL:  Well appearing.  No acute distress HEENT: Pupils equal round and reactive, fundi not visualized, oral mucosa unremarkable NECK:  No jugular venous distention, waveform within normal limits, carotid upstroke brisk and symmetric, no bruits LUNGS:  Clear to auscultation bilaterally HEART:  RRR.  PMI not displaced or sustained,S1 and S2 within normal limits, no S3, no S4, no clicks, no rubs, no murmurs ABD:  Distended. positive bowel sounds normal in frequency in pitch, no bruits, no  rebound, no guarding, no midline pulsatile mass, no hepatomegaly, no splenomegaly EXT:  2 plus pulses throughout, no edema, no cyanosis no clubbing SKIN:  No rashes no nodules NEURO:  Cranial nerves II through XII grossly intact, motor grossly intact throughout PSYCH:  Cognitively intact, oriented to person place and time   Labs    High Sensitivity Troponin:   Recent Labs  Lab 01/10/21 1449 01/10/21 1600 01/10/21 1927  TROPONINIHS 802* 922* 888*      Chemistry Recent Labs  Lab 01/17/21 0248 01/18/21 0100 01/20/21 0431 01/21/21 0139 01/22/21 0301  NA 136   < > 135 133* 131*  K 3.4*   < > 4.8 4.4 5.0  CL 96*   < > 94* 92* 93*  CO2 28   < > 26 26 22   GLUCOSE 120*   < > 82 87 72  BUN 33*   < > 35* 33* 36*  CREATININE 1.15*   < > 1.31* 1.48* 1.37*  CALCIUM 9.0   < > 9.3 9.0 8.6*  PROT 6.5  --   --   --   --   ALBUMIN 2.9*  --   --   --   --  AST 65*  --   --   --   --   ALT 55*  --   --   --   --   ALKPHOS 124  --   --   --   --   BILITOT 1.0  --   --   --   --   GFRNONAA 59*   < > 51* 44* 48*  ANIONGAP 12   < > 15 15 16*   < > = values in this interval not displayed.     Hematology Recent Labs  Lab 01/19/21 0436 01/20/21 0431 01/21/21 0139  WBC 9.7 9.3 10.4  RBC 4.63 4.87 4.44  HGB 8.4* 8.9* 8.4*  HCT 30.9* 33.0* 29.6*  MCV 66.7* 67.8* 66.7*  MCH 18.1* 18.3* 18.9*  MCHC 27.2* 27.0* 28.4*  RDW 25.2* 26.9* 27.7*  PLT 183 181 213    BNPNo results for input(s): BNP, PROBNP in the last 168 hours.   DDimer No results for input(s): DDIMER in the last 168 hours.   Radiology    DG CHEST PORT 1 VIEW  Result Date: 01/22/2021 CLINICAL DATA:  Midline catheter placement EXAM: PORTABLE CHEST 1 VIEW COMPARISON:  01/10/2021 FINDINGS: Right upper extremity midline catheter has been placed with its tip overlying the expected brachial axillary junction. The left hemithorax is partially excluded. Mild cardiomegaly is again noted. Right lung is clear. No pneumothorax. No  pleural effusion on the right. Pulmonary vascularity is normal. IMPRESSION: Right upper extremity midline catheter tip at the brachial axillary junction. Electronically Signed   By: Fidela Salisbury MD   On: 01/22/2021 08:20    Cardiac Studies   ECHO 01/11/2021 1. Left ventricular ejection fraction, by estimation, is 20 to 25%. The  left ventricle has severely decreased function. The left ventricle  demonstrates global hypokinesis. The left ventricular internal cavity size  was mildly to moderately dilated. There  is moderate concentric left ventricular hypertrophy. Left ventricular  diastolic parameters are consistent with Grade II diastolic dysfunction  (pseudonormalization). Elevated left ventricular end-diastolic pressure.  The average left ventricular global  longitudinal strain is -5.8 %. The global longitudinal strain is abnormal.  2. Right ventricular systolic function is moderately reduced. The right  ventricular size is normal. There is moderately elevated pulmonary artery  systolic pressure.  3. Left atrial size was mildly dilated.  4. Right atrial size was mildly dilated.  5. Moderate pericardial effusion. The pericardial effusion is  circumferential. There is no evidence of cardiac tamponade.  6. The mitral valve is normal in structure. Mild mitral valve  regurgitation. No evidence of mitral stenosis.  7. The aortic valve is tricuspid. Aortic valve regurgitation is not  visualized. No aortic stenosis is present.  8. The inferior vena cava is dilated in size with <50% respiratory  variability, suggesting right atrial pressure of 15 mmHg.    ECHO 05/02/2020  1. Left ventricular ejection fraction, by estimation, is 40 to 45%. The  left ventricle has mildly decreased function. The left ventricle  demonstrates global hypokinesis. There is mild concentric left ventricular  hypertrophy. Left ventricular diastolic  parameters are consistent with Grade II diastolic  dysfunction  (pseudonormalization). Elevated left atrial pressure.  2. Right ventricular systolic function is low normal. The right  ventricular size is normal. There is moderately elevated pulmonary artery  systolic pressure.  3. Left atrial size was mildly dilated.  4. Right atrial size was mildly dilated.  5. Moderate pericardial effusion. The pericardial effusion is  circumferential.  There is no evidence of cardiac tamponade.  6. The mitral valve is normal in structure. No evidence of mitral valve  regurgitation.  7. Tricuspid valve regurgitation is mild to moderate.  8. The aortic valve is normal in structure. Aortic valve regurgitation is  not visualized.  9. The inferior vena cava is dilated in size with <50% respiratory  variability, suggesting right atrial pressure of 15 mmHg  Patient Profile     45 y.o. female with chronic systolic and diastolic heart failure admitted with acute on chronic systolic diastolic heart failure in the setting of recurrent SVT, Covid-19, anemia, and newly diagnosed pelvic mass with bilateral ureteral compression and hydronephrosis.  Assessment & Plan    # Acute on chronic systolic and diastolic heart failure: # Hypertension: LVEF 40-45%.   Euvolemic on exam.  Sodium is 131, suspect mild overdiuresis.  Renal function is above baseline.  Continue to hold Lasix.  Continue BiDil and metoprolol.  She is not on an ARB due to her renal dysfunction.  Would add as blood pressure and renal function allow.    # SVT:  AVNRT.  Responded well to adenosine.  She had another episode of SVT overnight.  She was given adenosine 3 times (6 mg, 12 mg, 18mg ) without response.  A new midline catheter was placed and she responded to 12 mg.  Given her systolic dysfunction would like to avoid diltiazem.  Given her commodities she isn't a great ablation candidate.  Will ask EP to weigh in on antiarrhythmic options.  Suspect that recurrent SVT will present a problem for discharge  to inpatient psychiatry.  # Pericardia effusion:  Unchanged from 04/2020.  No tamponode clinically.  # Iron deficiency anemia:  Thought to be to menorrhagia vs GI.  # Pelvic mass:  Outpatient w/u pending.    # Vascular dementia/paranoid ideation:   # Acute renal failure: Stable.  Holding diuretics and ARB as above.      For questions or updates, please contact [place] Please consult [email or web address] for contact info under        Signed, Skeet Latch, MD  01/22/2021, 8:57 AM

## 2021-01-22 NOTE — Progress Notes (Signed)
Occupational Therapy Treatment Patient Details Name: Holly Bradley MRN: [id number] DOB: [birth year] Today's Date: 01/22/2021    History of present illness 45 y.o. female with a hx of chronic systolic and diastolic heart failure, SVT, chronic anemia secondary to uterine fibroids, and HTN who presented with worsening LE edema, weakness and dizziness found to be in SVT. Pt found to have covid but as of 1/31 is currently off precautions.   OT comments  Pt making slow but steady progress when pt is willing to work with therapy. Pt with rapid response called last night with HR up in 160s.  Spoke with nursing who stated to just go easy with pt this am.  Pt was agreeable to getting up to groom and dress LEs and did so with min guard standing at the sink.  Pt required cues for safety, is limited in areas of awareness and problem solving. Pt left water running after done grooming at sink and did not realize until told.  Pt with decreased attention span and is a fall risk.   Follow Up Recommendations  No OT follow up    Equipment Recommendations  None recommended by OT    Recommendations for Other Services      Precautions / Restrictions Precautions Precautions: Fall Precaution Comments: Watch HR and BP Restrictions Weight Bearing Restrictions: No       Mobility Bed Mobility Overal bed mobility: Independent             General bed mobility comments: no assist necessary  Transfers Overall transfer level: Needs assistance Equipment used: None Transfers: Sit to/from Stand;Stand Pivot Transfers Sit to Stand: Supervision Stand pivot transfers: Supervision       General transfer comment: Pt requires cues for safety.    Balance Overall balance assessment: Mild deficits observed, not formally tested Sitting-balance support: Bilateral upper extremity supported;Feet supported Sitting balance-Leahy Scale: Good                                     ADL either  performed or assessed with clinical judgement   ADL Overall ADL's : Needs assistance/impaired Eating/Feeding: Independent Eating/Feeding Details (indicate cue type and reason): eating breakfast upon arrival Grooming: Wash/dry hands;Wash/dry face;Oral care;Min guard;Standing Grooming Details (indicate cue type and reason): pt stood at sink for appx 5 min to groom.  Pt required cues to initiate each different task and forgot to turn off water when finished.  Pt with decreased initation and follow through. No overt loss of balance.  HR stayed under 120.             Lower Body Dressing: Min guard;Sit to/from stand;Cueing for sequencing Lower Body Dressing Details (indicate cue type and reason): Pt dressed lower body with min guard only when standing.  Pt not aware of deficits and often is impulsive. Toilet Transfer: Ambulation;Min Psychiatric nurse Details (indicate cue type and reason): min guard assist mostly for lines mgmt,  no AD or overt LOB noted Toileting- Water quality scientist and Hygiene: Min guard;Sit to/from stand;Cueing for safety       Functional mobility during ADLs: Min guard General ADL Comments: pt with issues with HR last night.  Instructed by nurse to keep things at a minimum so further adl tasks were deferred.     Vision   Vision Assessment?: No apparent visual deficits   Perception     Praxis      Cognition Arousal/Alertness: Awake/alert  Behavior During Therapy: Flat affect Overall Cognitive Status: Impaired/Different from baseline Area of Impairment: Awareness;Safety/judgement                         Safety/Judgement: Decreased awareness of safety;Decreased awareness of deficits Awareness: Intellectual   General Comments: Pt cooperative today but slow to follow commands Most commands needed to be repeated.  Pt required cues to turn off water after brushing teeth.        Exercises     Shoulder Instructions       General Comments Pt  with good participation today although slightly limited by HR issues overnight.    Pertinent Vitals/ Pain       Pain Assessment: No/denies pain  Home Living                                          Prior Functioning/Environment              Frequency  Min 2X/week        Progress Toward Goals  OT Goals(current goals can now be found in the care plan section)  Progress towards OT goals: Progressing toward goals  Acute Rehab OT Goals Patient Stated Goal: none stated OT Goal Formulation: With patient Time For Goal Achievement: 01/26/21 Potential to Achieve Goals: Fair ADL Goals Pt Will Perform Grooming: with supervision;sitting;standing Pt Will Perform Lower Body Bathing: with min guard assist;sit to/from stand Pt Will Perform Lower Body Dressing: with min guard assist;sit to/from stand Pt Will Transfer to Toilet: with min guard assist;ambulating;regular height toilet Pt Will Perform Toileting - Clothing Manipulation and hygiene: with supervision;sit to/from stand  Plan Discharge plan remains appropriate;Frequency remains appropriate    Co-evaluation                 AM-PAC OT "6 Clicks" Daily Activity     Outcome Measure   Help from another person eating meals?: None Help from another person taking care of personal grooming?: None Help from another person toileting, which includes using toliet, bedpan, or urinal?: A Little Help from another person bathing (including washing, rinsing, drying)?: A Little Help from another person to put on and taking off regular upper body clothing?: None Help from another person to put on and taking off regular lower body clothing?: None 6 Click Score: 22    End of Session    OT Visit Diagnosis: Unsteadiness on feet (R26.81);Muscle weakness (generalized) (M62.81)   Activity Tolerance Treatment limited secondary to medical complications (Comment)   Patient Left in bed;with call bell/phone within reach;with  bed alarm set;Other (comment)   Nurse Communication Mobility status;Other (comment)        Time: 6269-4854 OT Time Calculation (min): 20 min  Charges: OT General Charges $OT Visit: 1 Visit OT Treatments $Self Care/Home Management : 8-22 mins    Glenford Peers 01/22/2021, 8:35 AM

## 2021-01-22 NOTE — Progress Notes (Signed)
Heart Failure Stewardship Pharmacist Progress Note   PCP: Patient, No Pcp Per PCP-Cardiologist: Glenetta Hew, MD    HPI:  45 yo F with PMH of chronic systolic and diastolic HF, HTN, uterine fibroids, and history of SVT. She presented to the ED for evaluation of weakness, and was found to have SVT, acute on chronic heart failure, hypertensive emergency, AKI, and a COVID-19 infection. An ECHO was done on 01/11/21 and LVEF severely decreased to 20-25% (down from 40-45% in May 2021). Has had recurrent SVT throughout this admission. Not deemed to be a great candidate for ablation given her comorbidities by cardiology. EP has been consulted for further recommendations.  Current HF Medications: Metoprolol XL 200 mg daily  BiDil 20/37.5 mg 2 tabs TID  Prior to admission HF Medications: None - not taking any medications  Pertinent Lab Values: . Serum creatinine 1.37, BUN 36, Potassium 5.0, Sodium 131, BNP >4500, Magnesium 2.0  Vital Signs: . Weight: 123 lbs (admission weight: 142 lbs) . Blood pressure: 130/70s  . Heart rate: 90s - required additional adenosine last evening  Medication Assistance / Insurance Benefits Check: Does the patient have prescription insurance?  Yes Type of insurance plan: Medicaid (out-of-state)   Outpatient Pharmacy:  Prior to admission outpatient pharmacy: [facility] Is the patient willing to use [facility] at discharge? Yes   Assessment: 1. Acute on chronic systolic CHF (EF 96-04%), likely secondary to medication noncompliance, SVT, and COVID-19 infection. NYHA class III symptoms. - Furosemide stopped on 1/30 due to bump in SCr and possible overdiuresis - Continue metoprolol XL 200 mg daily - Losartan stopped on 1/30 due to worsening renal function - Spironolactone stopped on 1/30 due to worsening renal function. K up to 5.0 today. If able to resume, would only resume at 25 mg daily instead of 50 mg daily - Continue BiDil 20/27.5 mg 2 tabs TID   Plan: 1)  Medication changes recommended at this time: - if creatinine continues to improve tomorrow, would restart losartan   2) Patient assistance application(s): - None pending  3)  Education  - To be completed prior to discharge - Appreciate assistance from HF [place], PharmD, BCPS Heart Failure Cytogeneticist Phone [phone]

## 2021-01-22 NOTE — Progress Notes (Signed)
Subjective:   Overnight, patient went into SVT and received 6mg  then 12mg  then 18mg  of adenosine. Dr. Jari Bradley with cardiology placed a midline and administered an additional 12mg  IV adenosine with return of patient to sinus tachycardia (~110).  This morning, patient reports that she feels well and denies any new complaints. She states that she had shortness of breath last night, but this has improved with resolution of her SVT. She has no questions, concerns or requests of our team.  Objective: Vital signs in last 24 hours: Vitals:   01/22/21 0000 01/22/21 0027 01/22/21 0400 01/22/21 0600  BP: 106/68 119/74 130/79   Pulse:   96   Resp: (!) 21  19   Temp:   98.8 F (37.1 C)   TempSrc:   Oral   SpO2: 98%  97%   Weight:    55.8 kg  Height:        Intake/Output Summary (Last 24 hours) at 01/22/2021 1048 Last data filed at 01/22/2021 0848 Gross per 24 hour  Intake 100 ml  Output 675 ml  Net -575 ml   Filed Weights   01/20/21 0400 01/21/21 0400 01/22/21 0600  Weight: 56.2 kg 55.7 kg 55.8 kg  Physical Exam Constitutional:      General: She is not in acute distress.    Appearance: She is ill-appearing.  HENT:     Mouth/Throat:     Mouth: Mucous membranes are moist.     Pharynx: Oropharynx is clear.  Neck:     Comments: No JVD Cardiovascular:     Rate and Rhythm: Regular rhythm. Tachycardia present.     Pulses: Normal pulses.     Heart sounds: Normal heart sounds.  Pulmonary:     Effort: Pulmonary effort is normal. No respiratory distress.     Breath sounds: Normal breath sounds.  Abdominal:     General: Bowel sounds are normal.     Comments: Large, firm, nontender mass extending from pelvic region to mid-abdomen  Musculoskeletal:     Right lower leg: No edema.     Left lower leg: No edema.  Skin:    General: Skin is warm and dry.     Capillary Refill: Capillary refill takes less than 2 seconds.  Neurological:     General: No focal deficit present.     Mental  Status: She is alert. Mental status is at baseline.    Labs in last 24 hours: CBC Latest Ref Rng & Units 01/21/2021 01/20/2021 01/19/2021  WBC 4.0 - 10.5 K/uL 10.4 9.3 9.7  Hemoglobin 12.0 - 15.0 g/dL 8.4(L) 8.9(L) 8.4(L)  Hematocrit 36.0 - 46.0 % 29.6(L) 33.0(L) 30.9(L)  Platelets 150 - 400 K/uL 213 181 183   BMP Latest Ref Rng & Units 01/22/2021 01/21/2021 01/20/2021  Glucose 70 - 99 mg/dL 72 87 82  BUN 6 - 20 mg/dL 36(H) 33(H) 35(H)  Creatinine 0.44 - 1.00 mg/dL 1.37(H) 1.48(H) 1.31(H)  Sodium 135 - 145 mmol/L 131(L) 133(L) 135  Potassium 3.5 - 5.1 mmol/L 5.0 4.4 4.8  Chloride 98 - 111 mmol/L 93(L) 92(L) 94(L)  CO2 22 - 32 mmol/L 22 26 26   Calcium 8.9 - 10.3 mg/dL 8.6(L) 9.0 9.3   Imaging in last 24 hours: DG CHEST PORT 1 VIEW  Result Date: 01/22/2021 CLINICAL DATA:  Midline catheter placement EXAM: PORTABLE CHEST 1 VIEW COMPARISON:  01/10/2021 FINDINGS: Right upper extremity midline catheter has been placed with its tip overlying the expected brachial axillary junction. The left hemithorax is partially  excluded. Mild cardiomegaly is again noted. Right lung is clear. No pneumothorax. No pleural effusion on the right. Pulmonary vascularity is normal. IMPRESSION: Right upper extremity midline catheter tip at the brachial axillary junction. Electronically Signed   By: Fidela Salisbury MD   On: 01/22/2021 08:20   Assessment/Plan:  Principal Problem:   Acute exacerbation of CHF (congestive heart failure) (HCC) Active Problems:   CHF (congestive heart failure) ([place])   Pneumonia due to COVID-19 virus   Acute renal failure (HCC)   Anemia   Pelvic mass in female   Hypertensive emergency   Fibroid   Menorrhagia   Bilateral leg edema   Delirium  [name] is a 45 year old female with past medical history significant for HTN, combined systolic and diastolic heart failure, paroxysmal SVT, and massive uterine fibroids complicated by menometrorrhagia and chronic blood loss anemia who  presented to [facility]10[facility]21 for evaluation of weaknessfound to be in SVT with acute on chronic biventricular heart failure,hypertensive emergency,AKI, COVID-19 infection and 23cm uterine mass filling peritoneal cavity with compression of venous structures and ureters with hospital course complicated by ongoing paranoia and psychosis.  #Biventricular heart failure, chronic #Moderate pericardial effusion, chronic #Hypertension, chronic EF of 40-45% in May 2021. Patient was lost to follow-up and nonadherent to prescribed medications following discharge. Now LVEF of 20-25%, moderate RV dysfunction, moderate PAH, moderate pericardial effusion. Patient appears euvolemic on examination today. Diuretics and losartan held yesterday due to concern for overdiuresis with worsening renal function.  -Cardiology following, appreciate recommendations  -Continue holding furosemide, losartan and spironolactone  -Continue two tablets of isosorbide-hydralazine 20-37.5mg  three times daily  -Continue metoprolol 200mg  XL daily  -Strict intake/output -Daily weights  #AVNRT,recurrent  #Non-sustained ventricular tachycardia, recurrent Patient continues to experience episodes of AVNRT despite escalation of metoprolol which are responsive to IV adenosine. Due to patient's comorbidities, she is not an ideal candidate for ablation. Cardiology recommending EP evaluation for antiarrhythmic options. -Cardiology following, appreciate recommendations:  -EP consulted, appreciate recommendations  -Continue metoprolol 200mg  XL daily  #Paranoia and psychosis, active Psychiatry evaluated and recommended initiation of antipsychotic and discharge to inpatient psychiatry when medically cleared. Due to patient's recurrent episodes of SVT, she is not stable for discharge at this time. -Continue aripiprazole 2mg  daily -Discharge to inpatient psychiatry when medically clear  #Large uterine fibroids, chronic #Iron deficiency  anemia2/2 menometrorrhagia, chronic Patient noted to have 23cm pelvic mass on CT Abdomen/Pelvis stable from MRI in 2019. MRI from 2019 revealed compression of right common iliac artery & vein, likely compression of IVC and left common iliac vein as well, and bladder. 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 Findings likely secondary to severely uncontrolled hypertension over many years. -Optimize medical conditions as listed above  #VTE ppx: enoxaparin 40mg  daily #IVF: None #Diet: Heart healthy #Code status: Full code #PT/OT recs: PT has signed off. No OT follow up, no equipment recommendations. #Dispo: Anticipated discharge to inpatient psychiatry  Cato Mulligan, MD 01/22/2021, 10:48 AM Pager: [phone] After 5pm on weekdays and 1pm on weekends: On Call Pager: [phone]

## 2021-01-22 NOTE — Progress Notes (Signed)
Pt went into SVT in the 160-170's, pt was feeling the high HR this time, Rapid Response  and MD on call came to the room to assist with giving Adenosine, 6, 12, and 18mg  was given to help, with this but no result, the IV may have been infiltrated, MD put another IV in, gave 5mg  of Metoprolol as well still no result, then gave last dose of Adenosine 12mg  and brought her HR down to 110's ST, will continue to monitor, Thanks Arvella Nigh RN.

## 2021-01-22 NOTE — Significant Event (Addendum)
Paged for recurrent SVT by nursing staff. Already on toprol XL 200 mg PO daily (LD 01/30 at 1100). Assessed patient and hemodynamically stable (90/60s) and A&O x4. She could feel the palpitations but otherwise was asymptomatic.  She has had several occurrences of short RP tachycardia consistent with AVRT.  Gave adenosine 6 mg IV through left forearm PIV with no change in arrhythmia.  Gave additional 12 mg of IV.  Left brachial IV with no change in rhythm.  Both IVs flushed without signs of infiltration but neither drew blood.  Gave additional adenosine 18 mg IV through left brachial IV with no change in rhythm. At this point despite no obvious signs of IV infiltration, I was concerned that the adenosine was not actually reaching the heart to affect AVRT. Metop 5 mg IV given through L FA PIV while working to get additional access, no effect on HR). Placed US guided L brachial vein 5 Fr micropuncture sheath. Good blood return. Adenosine 12 mg IV given through sheath with termination of AVRT to sinus tach (HR 100-110). BP 120/90s.

## 2021-01-22 NOTE — Significant Event (Signed)
Rapid Response Event Note   Reason for Call : SVT 170s Initial Focused Assessment:  Notified by nursing staff, pt was back in SVT. BP 90s.  Dr. Renella Cunas notified and on the way to bedside. I met Dr. Renella Cunas upon arrival to bedside. Ms. Belknap was alert, oriented x4. Denies SOB and CP. She stated she "just didn't want to be overmedicated." Pt was in SVT 160s.  Three attempts with adenosine 46m, 183mand 1852mere sequentially given through the Left upper arm PIV without any change in HR or rhythm.  Dr. CarRenella Cunasaced a new PIV (midline) with [place] in the Right upper arm. Adenosine 12 mg IV was given through the right PIV and pt slowed and converted to ST 110-120.  0027-HR 110 ST with PVCs, 119/74, RR 22 sats 98% on RA     Interventions:  -Adenosine 6mg81m2 mg, 18mg67m-After new PIV on Right arm, Adenosine 12 mg  Plan of Care:  -Notify MD and/or RRRN for further assistance if needed.     MD Notified:  Dr. CarliRenella Cunasbedside) Call Time: 2325 Arrival Time: 2330 End Time: 0100  Keishawna Carranza,Madelynn Done

## 2021-01-22 NOTE — Consult Note (Signed)
Cardiology Consultation:   Patient ID: Holly Bradley MRN: [id number]; DOB: 10-02-1976  Admit date: 01/10/2021 Date of Consult: 01/22/2021  Primary Care Provider: Patient, No Pcp Per [facility] Cardiologist: Glenetta Hew, MD  [facility] Electrophysiologist:  Vickie Epley, MD    Patient Profile:   Holly Bradley is a 45 y.o. female with a hx of chronic systolic and diastolic heart failure, SVT, chronic anemia secondary to uterine fibroids, and HTN and noncompliance who is being seen today for the evaluation of recurrent SVT at the request of Dr. Oval Linsey.  History of Present Illness:   Ms. Senk was last seen outpatient by [facility] in May 2021 during a hospitalization (She previously received cardiology care in [place] and had a past diagnosis of SVT poorly controlled on nifedipine) May 2021 she presented to [facility] with uncontrolled SVT and found to have mildly reduced EF of 40-45% and grade 2 DD. Adenosine broke her SVT in the ER. She was significantly anemic with Hb 4 requiring transfusion.  She was diuresed and discharged on coreg.  CHF felt to be high output in the setting of severe anemia and SVT.  Lasix continued for 1 week then PRN.  Echo did show moderate pericardial effusion suspected to improve with diuresis.  Plan for repeat echo was not completed.  She was referred to EP for possible SVT ablation but she no-showed three times.   Readmitted 01/10/21 with weakness, dizziness, bilateral lower extremity swelling found to have SVT on arrival to ER. Vagal maneuvers unsuccessful and she required adenosine 6 mg --> 12 mg to convert to sinus rhythm.  CXR negative for edema but enlarged pericardial silhouette consistent with prior Cardiology was consulted for suspected heart failure exacerbation and SVT. The patient was found to be grossly volume overloaded and reported she has been gaining weight with worsening LE edema over the past several months. Had not been  taking any of her medications as she ran out.  COVID + Tested + 01/10/21 , precautions ended 01/21/21   Anemia >>>  labs confirm Fe def - menometrorrhagia (most likely) vs GI etiology   Started on IV diuretics, NTG gtt and pending renal function, GDMT  TTE with LVEF 20-25%, global HK, moderate RV systolic dysfunction, moderate PAH, moderate pericardial effusion, RAP >61mmHg CT with large pelvic mass measuring 23cm, >> Seen by OBGYN; deferring treatment of suspected fibroid uterus for now and continue with medical optimization as patient is very high risk for the procedure, administered Depo provera on 01/12/21  01/13/21 cards note strange affect this day, intermittently staring off, not responding to conversation, attending requested psych consult  meds titrated for both recurrent SVT with increasing lopressor dosing as well as severe HTN, mentionined in notes, "concerned about compliance and underlying psychiatric illness and therefore entresto/spiro may not be good options for her"  01/15/21: cards note two tachycardias, one felt to be clearly a NSVT, BB further titrated Also note: Paranoid ideation: need to take this into consideration when deciding on long term meds and discussing aggressive therapies such as ICD  Cards s/o on 01/17/21  Back on board 01/18/21 SVT early AM, in setting of low grade fever. ECG highly suggestive of AV node reentry. Resolved w adenosine Also noted very few brief NSVT episodes 3-11 beats BB adjusted > Recurrent symptomatic AVNRT, failed to terminate with adenosine 6 mg bolus, but converted with 12 mg bolus. Frequent PVCs are seen which may be triggering the reentry circuit . IV metoprolol given and the  dose of oral metoprolol was increased.  01/19/21 cards note: pt not inclined to have invasive approach such as ablation,  Suspected NICM, not felt a invasive candidate for coronary evaluation   TODAY early this AM recurrent SVT by nursing staff. Already on  toprol XL 200 mg PO daily (LD 01/30 at 1100). Assessed patient and hemodynamically stable (90/60s) and A&O x4. She could feel the palpitations but otherwise was asymptomatic.  She has had several occurrences of short RP tachycardia consistent with AVRT.  Gave adenosine 6 mg IV through left forearm PIV with no change in arrhythmia.  Gave additional 12 mg of IV.  Left brachial IV with no change in rhythm.  Both IVs flushed without signs of infiltration but neither drew blood.  Gave additional adenosine 18 mg IV through left brachial IV with no change in rhythm. At this point despite no obvious signs of IV infiltration, I was concerned that the adenosine was not actually reaching the heart to affect AVRT. Metop 5 mg IV given through L FA PIV while working to get additional access, no effect on HR). Placed US guided L brachial vein 5 Fr micropuncture sheath. Good blood return. Adenosine 12 mg IV given through sheath with termination of AVRT to sinus tach (HR 100-110). BP 120/90s.   EP  Is asked to help with suspicion that recurrent SVT will present a problem for discharge to inpatient psychiatry  #Paranoia and psychosis, active Plan to d/c to inpatient psych. 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 Findings likely secondary to severely uncontrolled hypertension over many years  Past Medical History:  Diagnosis Date  . Chronic blood loss anemia    Related to uterine fibroids  . Essential hypertension    Poorly controlled, repeat by report only on nicardipine 90 mg  . Fibroid    Likely complicated by by menometrorrhagia and chronic anemia  . Paroxysmal SVT (supraventricular tachycardia) (HCC)    Frequent episodes, can last anywhere from 20 minutes to 12-15 hours.  Not on  beta-blocker or calcium channel blocker    Past Surgical History:  Procedure Laterality Date  . Unknown       Home Medications:  Prior to Admission medications   Medication Sig Start Date End Date Taking? Authorizing Provider  carvedilol (COREG) 6.25 MG tablet Take 2 tablets (12.5 mg total) by mouth 2 (two) times daily with a meal. Patient not taking: No sig reported 05/03/20   Little Ishikawa, MD  furosemide (LASIX) 40 MG tablet Take 1 tablet (40 mg total) by mouth daily. Patient not taking: No sig reported 05/03/20 05/03/21  Little Ishikawa, MD  lisinopril (ZESTRIL) 20 MG tablet Take 2 tablets (40 mg total) by mouth daily. Patient not taking: No sig reported 05/03/20   Little Ishikawa, MD    Inpatient Medications: Scheduled Meds: . ARIPiprazole  2 mg Oral QHS  . enoxaparin (LOVENOX) injection  40 mg Subcutaneous Q24H  . isosorbide-hydrALAZINE  2 tablet Oral TID  . metoprolol succinate  200 mg Oral Daily   Continuous Infusions:  PRN Meds: acetaminophen, mineral oil-hydrophilic petrolatum  Allergies:   No Known Allergies  Social History:   Social History   Socioeconomic History  . Marital status: Single    Spouse name: Not on file  . Number of children: Not  on file  . Years of education: Not on file  . Highest education level: Not on file  Occupational History  . Not on file  Tobacco Use  . Smoking status: Not on file  . Smokeless tobacco: Not on file  Substance and Sexual Activity  . Alcohol use: Not on file  . Drug use: Not on file  . Sexual activity: Not on file  Other Topics Concern  . Not on file  Social History Narrative  . Not on file   Social Determinants of Health   Financial Resource Strain: Not on file  Food Insecurity: Not on file  Transportation Needs: Not on file  Physical Activity: Not on file  Stress: Not on file  Social Connections: Not on file  Intimate Partner Violence: Not on file    Family History:   Family History   Family history unknown: Yes     ROS:  Please see the history of present illness.  All other ROS reviewed and negative.     Physical Exam/Data:   Vitals:   01/22/21 0000 01/22/21 0027 01/22/21 0400 01/22/21 0600  BP: 106/68 119/74 130/79   Pulse:   96   Resp: (!) 21  19   Temp:   98.8 F (37.1 C)   TempSrc:   Oral   SpO2: 98%  97%   Weight:    55.8 kg  Height:        Intake/Output Summary (Last 24 hours) at 01/22/2021 1134 Last data filed at 01/22/2021 0848 Gross per 24 hour  Intake 100 ml  Output 675 ml  Net -575 ml   Last 3 Weights 01/22/2021 01/21/2021 01/20/2021  Weight (lbs) 123 lb 0.3 oz 122 lb 12.8 oz 123 lb 12.8 oz  Weight (kg) 55.8 kg 55.702 kg 56.155 kg     Body mass index is 19.86 kg/m.  General:  Well nourished, well developed, in no acute distress  HEENT: normal Lymph: no adenopathy Neck: no JVD Endocrine:  No thryomegaly Vascular: No carotid bruits; FA pulses 2+ bilaterally without bruits  Cardiac:  normal S1, S2; RRR; no murmur   Lungs:  clear to auscultation bilaterally, no wheezing, rhonchi or rales  Abd: soft, nontender, no hepatomegaly  Ext: no edema Musculoskeletal:  No deformities, BUE and BLE strength normal and equal Skin: warm and dry  Neuro:  CNs 2-12 intact, no focal abnormalities noted Psych:  Normal affect confabulating  EKG:  The EKG was personally reviewed and demonstrates:    SVT 172bpm, suspect short RP SVT 150bpm, sus[ect short RP SVT 149m  Lead placement vs 2:1 tachycardia SVT 163bpm, ? Mid-RP   SR  Inf.lat T inversions,   Telemetry:  Telemetry was personally reviewed and demonstrates:   SR/ST 90's-100's PSVT NSVT  Relevant CV Studies:  TTE 01/11/21: 1. Left ventricular ejection fraction, by estimation, is 20 to 25%. The  left ventricle has severely decreased function. The left ventricle  demonstrates global hypokinesis. The left ventricular internal cavity size  was mildly to moderately dilated. There  is  moderate concentric left ventricular hypertrophy. Left ventricular  diastolic parameters are consistent with Grade II diastolic dysfunction  (pseudonormalization). Elevated left ventricular end-diastolic pressure.  The average left ventricular global  longitudinal strain is -5.8 %. The global longitudinal strain is abnormal.  2. Right ventricular systolic function is moderately reduced. The right  ventricular size is normal. There is moderately elevated pulmonary artery  systolic pressure.  3. Left atrial size was mildly dilated.  4. Right  atrial size was mildly dilated.  5. Moderate pericardial effusion. The pericardial effusion is  circumferential. There is no evidence of cardiac tamponade.  6. The mitral valve is normal in structure. Mild mitral valve  regurgitation. No evidence of mitral stenosis.  7. The aortic valve is tricuspid. Aortic valve regurgitation is not  visualized. No aortic stenosis is present.  8. The inferior vena cava is dilated in size with <50% respiratory  variability, suggesting right atrial pressure of 15 mmHg.   Comparison(s): Changes from prior study are noted.   TTE 04/2020: 1. Left ventricular ejection fraction, by estimation, is 40 to 45%. The  left ventricle has mildly decreased function. The left ventricle  demonstrates global hypokinesis. There is mild concentric left ventricular  hypertrophy. Left ventricular diastolic  parameters are consistent with Grade II diastolic dysfunction  (pseudonormalization). Elevated left atrial pressure.  2. Right ventricular systolic function is low normal. The right  ventricular size is normal. There is moderately elevated pulmonary artery  systolic pressure.  3. Left atrial size was mildly dilated.  4. Right atrial size was mildly dilated.  5. Moderate pericardial effusion. The pericardial effusion is  circumferential. There is no evidence of cardiac tamponade.  6. The mitral valve is normal in  structure. No evidence of mitral valve  regurgitation.  7. Tricuspid valve regurgitation is mild to moderate.  8. The aortic valve is normal in structure. Aortic valve regurgitation is  not visualized.  9. The inferior vena cava is dilated in size with <50% respiratory  variability, suggesting right atrial pressure of 15 mmHg.  Laboratory Data:  High Sensitivity Troponin:   Recent Labs  Lab 01/10/21 1449 01/10/21 1600 01/10/21 1927  TROPONINIHS 802* 922* 888*     Chemistry Recent Labs  Lab 01/20/21 0431 01/21/21 0139 01/22/21 0301  NA 135 133* 131*  K 4.8 4.4 5.0  CL 94* 92* 93*  CO2 26 26 22   GLUCOSE 82 87 72  BUN 35* 33* 36*  CREATININE 1.31* 1.48* 1.37*  CALCIUM 9.3 9.0 8.6*  GFRNONAA 51* 44* 48*  ANIONGAP 15 15 16*    Recent Labs  Lab 01/17/21 0248  PROT 6.5  ALBUMIN 2.9*  AST 65*  ALT 55*  ALKPHOS 124  BILITOT 1.0   Hematology Recent Labs  Lab 01/19/21 0436 01/20/21 0431 01/21/21 0139  WBC 9.7 9.3 10.4  RBC 4.63 4.87 4.44  HGB 8.4* 8.9* 8.4*  HCT 30.9* 33.0* 29.6*  MCV 66.7* 67.8* 66.7*  MCH 18.1* 18.3* 18.9*  MCHC 27.2* 27.0* 28.4*  RDW 25.2* 26.9* 27.7*  PLT 183 181 213   BNPNo results for input(s): BNP, PROBNP in the last 168 hours.  DDimer No results for input(s): DDIMER in the last 168 hours.   Radiology/Studies:  DG CHEST PORT 1 VIEW  Result Date: 01/22/2021 CLINICAL DATA:  Midline catheter placement EXAM: PORTABLE CHEST 1 VIEW COMPARISON:  01/10/2021 FINDINGS: Right upper extremity midline catheter has been placed with its tip overlying the expected brachial axillary junction. The left hemithorax is partially excluded. Mild cardiomegaly is again noted. Right lung is clear. No pneumothorax. No pleural effusion on the right. Pulmonary vascularity is normal. IMPRESSION: Right upper extremity midline catheter tip at the brachial axillary junction. Electronically Signed   By: Fidela Salisbury MD   On: 01/22/2021 08:20     Assessment and  Plan:   1. SVTs     Adenosine sensitive     Perhaps 2 different, the last episode more difficult to stop  with escalation adenosine dose + IV lopressor     On Toprol 200mg  daily 2. NSVT 3. Acute/chronic CHF (diastolic/systolic)     Stable pericardial effusion, no tamponade 4. CM, suspect NICM likely 2/2 uncontrolled HTN      Not felt an invasive w/u candidate      Primary cardiology team managing 5. Non-compliance 6. Active episode of Paranoia and psychosis     Pending in-patient psychiatry admission     felt to be a contributor to cardiology decisions as well as far as invasive or implantable recommendations 7. 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 Findings likely secondary to severely uncontrolled hypertension over many years" 8. Anemic     Iron def     Likely GYN     Hgb 7's-8's 10. HTN       Much iproved 11. COVID infection        Off precautions       Risk Assessment/Risk Scores:  { For questions or updates, please contact [place] Please consult [email or web address] for contact info under    Signed, Baldwin Jamaica, PA-C  01/22/2021 11:34 AM SVT-recurrent short RP with 2 different RP intervals  Ventricular tachycardia-nonsustained  Cardiomyopathy-severe left sided involvement without right-sided involvement  PVCs-monomorphic averaging approximately 5% (rough estimate)  Anemia-chronic  Paranoia/psychosis-recurrent  Question dementia    The patient has symptomatic SVT and nonsustained VT.  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  Her SVT mechanism appears to be AV nodal reentry primarily based on long PR initiation and short RP tachycardia with a pseudo-R prime.  However, she also has an atrial tachycardia and a second short RP tachycardia with a somewhat  longer VA interval.  This latter tachycardia in the first might both be amenable to ablation of the slow pathway; however, her neuropsychiatric status and her recent and recurrent fever make procedural opportunities unattractive currently.  Hence, given the recurrent nature of her symptomatic SVT medical therapy is indicated.  Given her left ventricular dysfunction amiodarone is our safest option not withstanding its side effects.  We will have to keep careful attention to this; I have reviewed this with her.  With her confabulation I am not sure that she fully grasps the implications of her cardiac condition (she was unaware of her LV dysfunction saying that she needed tests)  To the degree that the PVCs may be contributing to her cardiomyopathy.  Also likely suppressed by the amiodarone.  Hopefully with control of her arrhythmia, there will be some improvement in LV function  Will defer to primary cardiology service initiation of guideline therapy for her LV dysfunction.

## 2021-01-23 DIAGNOSIS — I471 Supraventricular tachycardia: Secondary | ICD-10-CM | POA: Diagnosis not present

## 2021-01-23 DIAGNOSIS — F29 Unspecified psychosis not due to a substance or known physiological condition: Secondary | ICD-10-CM

## 2021-01-23 DIAGNOSIS — I5023 Acute on chronic systolic (congestive) heart failure: Secondary | ICD-10-CM | POA: Diagnosis not present

## 2021-01-23 DIAGNOSIS — I5082 Biventricular heart failure: Secondary | ICD-10-CM | POA: Diagnosis not present

## 2021-01-23 DIAGNOSIS — I5041 Acute combined systolic (congestive) and diastolic (congestive) heart failure: Secondary | ICD-10-CM

## 2021-01-23 DIAGNOSIS — N921 Excessive and frequent menstruation with irregular cycle: Secondary | ICD-10-CM

## 2021-01-23 DIAGNOSIS — I472 Ventricular tachycardia: Secondary | ICD-10-CM | POA: Diagnosis not present

## 2021-01-23 DIAGNOSIS — F22 Delusional disorders: Secondary | ICD-10-CM | POA: Diagnosis not present

## 2021-01-23 DIAGNOSIS — N179 Acute kidney failure, unspecified: Secondary | ICD-10-CM | POA: Diagnosis not present

## 2021-01-23 LAB — BASIC METABOLIC PANEL
Anion gap: 13 (ref 5–15)
BUN: 35 mg/dL — ABNORMAL HIGH (ref 6–20)
CO2: 25 mmol/L (ref 22–32)
Calcium: 8.8 mg/dL — ABNORMAL LOW (ref 8.9–10.3)
Chloride: 94 mmol/L — ABNORMAL LOW (ref 98–111)
Creatinine, Ser: 1.51 mg/dL — ABNORMAL HIGH (ref 0.44–1.00)
GFR, Estimated: 43 mL/min — ABNORMAL LOW (ref >60)
Glucose, Bld: 98 mg/dL (ref 70–99)
Potassium: 4.1 mmol/L (ref 3.5–5.1)
Sodium: 132 mmol/L — ABNORMAL LOW (ref 135–145)

## 2021-01-23 LAB — MAGNESIUM: Magnesium: 2 mg/dL (ref 1.7–2.4)

## 2021-01-23 LAB — TSH: TSH: 1.953 u[IU]/mL (ref 0.350–4.500)

## 2021-01-23 LAB — T4, FREE: Free T4: 1.18 ng/dL — ABNORMAL HIGH (ref 0.61–1.12)

## 2021-01-23 MED ORDER — AMIODARONE HCL 200 MG PO TABS
400.0000 mg | ORAL_TABLET | Freq: Two times a day (BID) | ORAL | Status: DC
  Administered 2021-01-23 – 2021-01-27 (×8): 400 mg via ORAL
  Filled 2021-01-23 (×8): qty 2

## 2021-01-23 MED ORDER — AMIODARONE HCL 200 MG PO TABS: 400.0000 mg | ORAL_TABLET | Freq: Two times a day (BID) | ORAL | Status: DC

## 2021-01-23 MED ORDER — AMIODARONE HCL 200 MG PO TABS: 200.0000 mg | ORAL_TABLET | Freq: Every day | ORAL | Status: DC

## 2021-01-23 NOTE — Plan of Care (Signed)
  Problem: Education: Goal: Ability to verbalize understanding of medication therapies will improve Outcome: Progressing Goal: Individualized Educational Video(s) Outcome: Progressing   Problem: Activity: Goal: Capacity to carry out activities will improve Outcome: Progressing   

## 2021-01-23 NOTE — Progress Notes (Signed)
Progress Note  [name] Date of Encounter: 01/23/2021  [facility] Cardiologist: Glenetta Hew, MD   Subjective   Without complaints x mild nausea maybe after the amo  Inpatient Medications    Scheduled Meds: . [START ON 01/29/2021] amiodarone  200 mg Oral Daily  . amiodarone  400 mg Oral BID  . ARIPiprazole  2 mg Oral QHS  . enoxaparin (LOVENOX) injection  40 mg Subcutaneous Q24H  . isosorbide-hydrALAZINE  2 tablet Oral TID  . metoprolol succinate  200 mg Oral Daily   Continuous Infusions:  PRN Meds: acetaminophen, mineral oil-hydrophilic petrolatum   Vital Signs    Vitals:   01/23/21 0825 01/23/21 1040 01/23/21 1223 01/23/21 1600  BP:  111/64 107/71 119/71  Pulse: 92     Resp:  19 18   Temp: 97.8 F (36.6 C)  99.4 F (37.4 C)   TempSrc: Oral  Oral   SpO2: 98% 98%    Weight:      Height:       No intake or output data in the 24 hours ending 01/23/21 1719 Last 3 Weights 01/23/2021 01/22/2021 01/21/2021  Weight (lbs) 123 lb 14.4 oz 123 lb 0.3 oz 122 lb 12.8 oz  Weight (kg) 56.2 kg 55.8 kg 55.702 kg      Telemetry    Sinus with PVCs no SVT - Personally Reviewed  ECG       - Personally Reviewed  Physical Exam   VS:  BP 119/71   Pulse 92   Temp 99.4 F (37.4 C) (Oral)   Resp 18   Ht 5\' 6"  (1.676 m)   Wt 56.2 kg   SpO2 98%   BMI 20.00 kg/m  , BMI Body mass index is 20 kg/m. Well developed and nourished in no acute distress HENT normal Neck supple  Clear Regular rate and rhythm, no murmurs or gallops Abd-soft with active BS No Clubbing cyanosis edema Skin-warm and dry A & Oriented  Grossly normal sensory and motor function     Labs    High Sensitivity Troponin:   Recent Labs  Lab 01/10/21 1449 01/10/21 1600 01/10/21 1927  TROPONINIHS 802* 922* 888*      Chemistry Recent Labs  Lab 01/17/21 0248 01/18/21 0100 01/21/21 0139 01/22/21 0301 01/23/21 0646  NA 136   < > 133* 131* 132*  K 3.4*   < > 4.4 5.0 4.1   CL 96*   < > 92* 93* 94*  CO2 28   < > 26 22 25   GLUCOSE 120*   < > 87 72 98  BUN 33*   < > 33* 36* 35*  CREATININE 1.15*   < > 1.48* 1.37* 1.51*  CALCIUM 9.0   < > 9.0 8.6* 8.8*  PROT 6.5  --   --   --   --   ALBUMIN 2.9*  --   --   --   --   AST 65*  --   --   --   --   ALT 55*  --   --   --   --   ALKPHOS 124  --   --   --   --   BILITOT 1.0  --   --   --   --   GFRNONAA 59*   < > 44* 48* 43*  ANIONGAP 12   < > 15 16* 13   < > = values in this interval not displayed.  Hematology Recent Labs  Lab 01/19/21 0436 01/20/21 0431 01/21/21 0139  WBC 9.7 9.3 10.4  RBC 4.63 4.87 4.44  HGB 8.4* 8.9* 8.4*  HCT 30.9* 33.0* 29.6*  MCV 66.7* 67.8* 66.7*  MCH 18.1* 18.3* 18.9*  MCHC 27.2* 27.0* 28.4*  RDW 25.2* 26.9* 27.7*  PLT 183 181 213    BNPNo results for input(s): BNP, PROBNP in the last 168 hours.   DDimer No results for input(s): DDIMER in the last 168 hours.   Radiology    DG CHEST PORT 1 VIEW  Result Date: 01/22/2021 CLINICAL DATA:  Midline catheter placement EXAM: PORTABLE CHEST 1 VIEW COMPARISON:  01/10/2021 FINDINGS: Right upper extremity midline catheter has been placed with its tip overlying the expected brachial axillary junction. The left hemithorax is partially excluded. Mild cardiomegaly is again noted. Right lung is clear. No pneumothorax. No pleural effusion on the right. Pulmonary vascularity is normal. IMPRESSION: Right upper extremity midline catheter tip at the brachial axillary junction. Electronically Signed   By: Fidela Salisbury MD   On: 01/22/2021 08:20    Cardiac Studies   ECHO 01/11/2021 1. Left ventricular ejection fraction, by estimation, is 20 to 25%. The  left ventricle has severely decreased function. The left ventricle  demonstrates global hypokinesis. The left ventricular internal cavity size  was mildly to moderately dilated. There  is moderate concentric left ventricular hypertrophy. Left ventricular  diastolic parameters are  consistent with Grade II diastolic dysfunction  (pseudonormalization). Elevated left ventricular end-diastolic pressure.  The average left ventricular global  longitudinal strain is -5.8 %. The global longitudinal strain is abnormal.  2. Right ventricular systolic function is moderately reduced. The right  ventricular size is normal. There is moderately elevated pulmonary artery  systolic pressure.  3. Left atrial size was mildly dilated.  4. Right atrial size was mildly dilated.  5. Moderate pericardial effusion. The pericardial effusion is  circumferential. There is no evidence of cardiac tamponade.  6. The mitral valve is normal in structure. Mild mitral valve  regurgitation. No evidence of mitral stenosis.  7. The aortic valve is tricuspid. Aortic valve regurgitation is not  visualized. No aortic stenosis is present.  8. The inferior vena cava is dilated in size with <50% respiratory  variability, suggesting right atrial pressure of 15 mmHg.    ECHO 05/02/2020  1. Left ventricular ejection fraction, by estimation, is 40 to 45%. The  left ventricle has mildly decreased function. The left ventricle  demonstrates global hypokinesis. There is mild concentric left ventricular  hypertrophy. Left ventricular diastolic  parameters are consistent with Grade II diastolic dysfunction  (pseudonormalization). Elevated left atrial pressure.  2. Right ventricular systolic function is low normal. The right  ventricular size is normal. There is moderately elevated pulmonary artery  systolic pressure.  3. Left atrial size was mildly dilated.  4. Right atrial size was mildly dilated.  5. Moderate pericardial effusion. The pericardial effusion is  circumferential. There is no evidence of cardiac tamponade.  6. The mitral valve is normal in structure. No evidence of mitral valve  regurgitation.  7. Tricuspid valve regurgitation is mild to moderate.  8. The aortic valve is normal in  structure. Aortic valve regurgitation is  not visualized.  9. The inferior vena cava is dilated in size with <50% respiratory  variability, suggesting right atrial pressure of 15 mmHg  Patient Profile     45 y.o. female with chronic systolic and diastolic heart failure admitted with acute on chronic systolic diastolic  heart failure in the setting of recurrent SVT, Covid-19, anemia, and newly diagnosed pelvic mass with bilateral ureteral compression and hydronephrosis.  Assessment & Plan    SVT-recurrent short RP with 2 different RP intervals  Ventricular tachycardia-nonsustained  Cardiomyopathy-severe left sided involvement without right-sided involvement  PVCs-monomorphic averaging approximately 5% (rough estimate)  Anemia-chronic  Paranoia/psychosis-recurrent  Question dementia   No interval SVT  PVC burden unchanged at about 3-5%  Continue amio and willwatch for amio issues with nausea     For questions or updates, please contact [place] [facility] Please consult [email or web address] for contact info under        Signed, Virl Axe, MD  01/23/2021, 5:19 PM

## 2021-01-23 NOTE — Progress Notes (Addendum)
Per CCMD pt had 17 bt run of vtach.    01/23/21 1040  Vitals  BP 111/64  MAP (mmHg) 78  ECG Heart Rate 88  Resp 19  Oxygen Therapy  SpO2 98 %  O2 Device Room Air   Pt denies chest pain or sob.  Will continue to monitor. Paged MD to make aware and cardiology PA.

## 2021-01-23 NOTE — Progress Notes (Signed)
Subjective:   Overnight: No acute events  Holly Bradley was seen at bedside this AM. States that Holly Bradley slept well overnight  Denies any palpitations, abdominal pain, fevers, or chills. Holly Bradley denies any other side effects with the Amiodarone.   Objective: Vital signs in last 24 hours: Vitals:   01/22/21 1500 01/22/21 2007 01/23/21 0327 01/23/21 0459  BP:  108/73  117/69  Pulse:  93  92  Resp:  19  16  Temp: 99.3 F (37.4 C) 99.7 F (37.6 C)  98.7 F (37.1 C)  TempSrc:  Oral  Oral  SpO2:  100%  100%  Weight:   56.2 kg   Height:       Physical Exam Constitutional:      General: Holly Bradley is not in acute distress.    Appearance: Normal appearance. Holly Bradley is not ill-appearing or diaphoretic.     Comments: Conversant, answers questions appropriately. NAD  Cardiovascular:     Rate and Rhythm: Normal rate and regular rhythm.     Pulses: Normal pulses.     Heart sounds: Normal heart sounds.  Pulmonary:     Effort: Pulmonary effort is normal.     Breath sounds: Normal breath sounds. No wheezing, rhonchi or rales.  Abdominal:     General: Abdomen is flat. Bowel sounds are normal.     Palpations: Abdomen is soft.  Neurological:     Mental Status: Holly Bradley is alert.  Psychiatric:        Mood and Affect: Mood normal.        Behavior: Behavior normal.       Intake/Output Summary (Last 24 hours) at 01/23/2021 0615 Last data filed at 01/22/2021 0848 Gross per 24 hour  Intake 0 ml  Output 250 ml  Net -250 ml   Filed Weights   01/21/21 0400 01/22/21 0600 01/23/21 0327  Weight: 55.7 kg 55.8 kg 56.2 kg  Labs in last 24 hours: CBC Latest Ref Rng & Units 01/21/2021 01/20/2021 01/19/2021  WBC 4.0 - 10.5 K/uL 10.4 9.3 9.7  Hemoglobin 12.0 - 15.0 g/dL 8.4(L) 8.9(L) 8.4(L)  Hematocrit 36.0 - 46.0 % 29.6(L) 33.0(L) 30.9(L)  Platelets 150 - 400 K/uL 213 181 183   BMP Latest Ref Rng & Units 01/22/2021 01/21/2021 01/20/2021  Glucose 70 - 99 mg/dL 72 87 82  BUN 6 - 20 mg/dL 36(H) 33(H) 35(H)  Creatinine  0.44 - 1.00 mg/dL 1.37(H) 1.48(H) 1.31(H)  Sodium 135 - 145 mmol/L 131(L) 133(L) 135  Potassium 3.5 - 5.1 mmol/L 5.0 4.4 4.8  Chloride 98 - 111 mmol/L 93(L) 92(L) 94(L)  CO2 22 - 32 mmol/L 22 26 26   Calcium 8.9 - 10.3 mg/dL 8.6(L) 9.0 9.3   Imaging in last 24 hours: DG CHEST PORT 1 VIEW  Result Date: 01/22/2021 CLINICAL DATA:  Midline catheter placement EXAM: PORTABLE CHEST 1 VIEW COMPARISON:  01/10/2021 FINDINGS: Right upper extremity midline catheter has been placed with its tip overlying the expected brachial axillary junction. The left hemithorax is partially excluded. Mild cardiomegaly is again noted. Right lung is clear. No pneumothorax. No pleural effusion on the right. Pulmonary vascularity is normal. IMPRESSION: Right upper extremity midline catheter tip at the brachial axillary junction. Electronically Signed   By: Fidela Salisbury MD   On: 01/22/2021 08:20   Assessment/Plan:  Principal Problem:   Acute exacerbation of CHF (congestive heart failure) ([place]) Active Problems:   CHF (congestive heart failure) ([place])   Pneumonia due to COVID-19 virus   Acute renal failure ([place])  Anemia   Pelvic mass in female   Hypertensive emergency   Fibroid   Menorrhagia   Bilateral leg edema   Delirium   Fever in adult  Holly Bradley is a 45 year old female with past medical history significant for HTN, combined systolic and diastolic heart failure, paroxysmal SVT, and massive uterine fibroids complicated by menometrorrhagia and chronic blood loss anemia who presented to [facility] on [facility]10[facility]21 for evaluation of weaknessfound to be in SVT with acute on chronic biventricular heart failure,hypertensive emergency,AKI, COVID-19 infection and 23cm uterine mass filling peritoneal cavity with compression of venous structures and ureters with hospital course complicated by ongoing paranoia and psychosis.  #Biventricular heart failure, chronic #Moderate pericardial effusion, chronic #Hypertension,  chronic Patient appears euvolemic on examination today. Diuretics and losartan held due to concern for overdiuresis with worsening renal function.  -Cardiology following, appreciate recommendations -Continue holding furosemide, losartan and spironolactone -Continue two tablets of isosorbide-hydralazine 20-37.5mg  three times daily -Continue metoprolol 200mg  XL daily  -Strict intake/output -Daily weights  #AVNRT,recurrent  #Non-sustained ventricular tachycardia, recurrent Patient continues to experience episodes of AVNRT despite escalation of metoprolol which are responsive to IV adenosine. Not an ideal ablation candidate. Started on Amiodarone yesterday with no SVT events overnight requiring adenosine.   -EP and Cardiology consulted, appreciate recommendations  - Amiodarone 400 mg BID for 7 days then 200 mg daily (start 01/23/20)    - TFTs and LFTs in one month    -  T3 and T4 with TSH today.   -Continue metoprolol 200mg  XL daily  #Paranoia and psychosis, active Psychiatry evaluated and recommended initiation of antipsychotic and discharge to inpatient psychiatry when medically cleared. Due to patient's recurrent episodes of SVT, Holly Bradley is not stable for discharge at this time. -Continue aripiprazole 2mg  daily -Discharge to inpatient psychiatry when medically clear  #Large uterine fibroids, chronic #Iron deficiency anemia2/2 menometrorrhagia, chronic Patient noted to have 23cm pelvic mass on CT Abdomen/Pelvis stable from MRI in 2019. MRI from 2019 revealed compression of right common iliac artery & vein, likely compression of IVC and left common iliac vein as well, and bladder. -Consulted GYN Onc who plans to follow-up patient in outpatient setting -GYN consulted and administered Depo provera on 01/12/21 -GYN recommended calling when period starts for consideration of starting PO medications -Daily CBC  #Vascular dementia, active. -Optimize medical conditions as listed above  #VTE  ppx: enoxaparin 40mg  daily #IVF: None #Diet: Heart healthy #Code status: Full code #PT/OT recs: PT has signed off. No OT follow up, no equipment recommendations. #Dispo: Anticipated discharge to inpatient psychiatry  Maudie Mercury, MD 01/23/2021, 6:15 AM Pager: [phone] After 5pm on weekdays and 1pm on weekends: On Call Pager: [phone]

## 2021-01-23 NOTE — Progress Notes (Signed)
Progress Note  [name] Date of Encounter: 01/23/2021  [facility] Cardiologist: Glenetta Hew, MD   Subjective   Feeling well.  No chest pain or shortness of breath.  Inpatient Medications    Scheduled Meds: . amiodarone  400 mg Oral BID  . ARIPiprazole  2 mg Oral QHS  . enoxaparin (LOVENOX) injection  40 mg Subcutaneous Q24H  . isosorbide-hydrALAZINE  2 tablet Oral TID  . metoprolol succinate  200 mg Oral Daily   Continuous Infusions:  PRN Meds: acetaminophen, mineral oil-hydrophilic petrolatum   Vital Signs    Vitals:   01/22/21 2007 01/23/21 0327 01/23/21 0459 01/23/21 0825  BP: 108/73  117/69   Pulse: 93  92 92  Resp: 19  16   Temp: 99.7 F (37.6 C)  98.7 F (37.1 C) 97.8 F (36.6 C)  TempSrc: Oral  Oral Oral  SpO2: 100%  100% 98%  Weight:  56.2 kg    Height:       No intake or output data in the 24 hours ending 01/23/21 0918 Last 3 Weights 01/23/2021 01/22/2021 01/21/2021  Weight (lbs) 123 lb 14.4 oz 123 lb 0.3 oz 122 lb 12.8 oz  Weight (kg) 56.2 kg 55.8 kg 55.702 kg      Telemetry    Sinus rhythm.  PVCs - Personally Reviewed  ECG    01/21/21: SVT.  Rate 163 bpm.   - Personally Reviewed  Physical Exam   VS:  BP 117/69   Pulse 92   Temp 97.8 F (36.6 C) (Oral)   Resp 16   Ht 5\' 6"  (1.676 m)   Wt 56.2 kg   SpO2 98%   BMI 20.00 kg/m  , BMI Body mass index is 20 kg/m. GENERAL:  Well appearing.  No acute distress HEENT: Pupils equal round and reactive, fundi not visualized, oral mucosa unremarkable NECK:  No jugular venous distention, waveform within normal limits, carotid upstroke brisk and symmetric, no bruits LUNGS:  Clear to auscultation bilaterally HEART:  RRR.  PMI not displaced or sustained,S1 and S2 within normal limits, no S3, no S4, no clicks, no rubs, no murmurs ABD:  Distended. positive bowel sounds normal in frequency in pitch, no bruits, no rebound, no guarding, no midline pulsatile mass, no hepatomegaly, no  splenomegaly EXT:  2 plus pulses throughout, no edema, no cyanosis no clubbing SKIN:  No rashes no nodules NEURO:  Cranial nerves II through XII grossly intact, motor grossly intact throughout PSYCH:  Pleasant. Oriented to person place and time   Labs    High Sensitivity Troponin:   Recent Labs  Lab 01/10/21 1449 01/10/21 1600 01/10/21 1927  TROPONINIHS 802* 922* 888*      Chemistry Recent Labs  Lab 01/17/21 0248 01/18/21 0100 01/21/21 0139 01/22/21 0301 01/23/21 0646  NA 136   < > 133* 131* 132*  K 3.4*   < > 4.4 5.0 4.1  CL 96*   < > 92* 93* 94*  CO2 28   < > 26 22 25   GLUCOSE 120*   < > 87 72 98  BUN 33*   < > 33* 36* 35*  CREATININE 1.15*   < > 1.48* 1.37* 1.51*  CALCIUM 9.0   < > 9.0 8.6* 8.8*  PROT 6.5  --   --   --   --   ALBUMIN 2.9*  --   --   --   --   AST 65*  --   --   --   --  ALT 55*  --   --   --   --   ALKPHOS 124  --   --   --   --   BILITOT 1.0  --   --   --   --   GFRNONAA 59*   < > 44* 48* 43*  ANIONGAP 12   < > 15 16* 13   < > = values in this interval not displayed.     Hematology Recent Labs  Lab 01/19/21 0436 01/20/21 0431 01/21/21 0139  WBC 9.7 9.3 10.4  RBC 4.63 4.87 4.44  HGB 8.4* 8.9* 8.4*  HCT 30.9* 33.0* 29.6*  MCV 66.7* 67.8* 66.7*  MCH 18.1* 18.3* 18.9*  MCHC 27.2* 27.0* 28.4*  RDW 25.2* 26.9* 27.7*  PLT 183 181 213    BNPNo results for input(s): BNP, PROBNP in the last 168 hours.   DDimer No results for input(s): DDIMER in the last 168 hours.   Radiology    DG CHEST PORT 1 VIEW  Result Date: 01/22/2021 CLINICAL DATA:  Midline catheter placement EXAM: PORTABLE CHEST 1 VIEW COMPARISON:  01/10/2021 FINDINGS: Right upper extremity midline catheter has been placed with its tip overlying the expected brachial axillary junction. The left hemithorax is partially excluded. Mild cardiomegaly is again noted. Right lung is clear. No pneumothorax. No pleural effusion on the right. Pulmonary vascularity is normal. IMPRESSION:  Right upper extremity midline catheter tip at the brachial axillary junction. Electronically Signed   By: Helyn Numbers MD   On: 01/22/2021 08:20    Cardiac Studies   ECHO 01/11/2021 1. Left ventricular ejection fraction, by estimation, is 20 to 25%. The  left ventricle has severely decreased function. The left ventricle  demonstrates global hypokinesis. The left ventricular internal cavity size  was mildly to moderately dilated. There  is moderate concentric left ventricular hypertrophy. Left ventricular  diastolic parameters are consistent with Grade II diastolic dysfunction  (pseudonormalization). Elevated left ventricular end-diastolic pressure.  The average left ventricular global  longitudinal strain is -5.8 %. The global longitudinal strain is abnormal.  2. Right ventricular systolic function is moderately reduced. The right  ventricular size is normal. There is moderately elevated pulmonary artery  systolic pressure.  3. Left atrial size was mildly dilated.  4. Right atrial size was mildly dilated.  5. Moderate pericardial effusion. The pericardial effusion is  circumferential. There is no evidence of cardiac tamponade.  6. The mitral valve is normal in structure. Mild mitral valve  regurgitation. No evidence of mitral stenosis.  7. The aortic valve is tricuspid. Aortic valve regurgitation is not  visualized. No aortic stenosis is present.  8. The inferior vena cava is dilated in size with <50% respiratory  variability, suggesting right atrial pressure of 15 mmHg.    ECHO 05/02/2020  1. Left ventricular ejection fraction, by estimation, is 40 to 45%. The  left ventricle has mildly decreased function. The left ventricle  demonstrates global hypokinesis. There is mild concentric left ventricular  hypertrophy. Left ventricular diastolic  parameters are consistent with Grade II diastolic dysfunction  (pseudonormalization). Elevated left atrial pressure.  2. Right  ventricular systolic function is low normal. The right  ventricular size is normal. There is moderately elevated pulmonary artery  systolic pressure.  3. Left atrial size was mildly dilated.  4. Right atrial size was mildly dilated.  5. Moderate pericardial effusion. The pericardial effusion is  circumferential. There is no evidence of cardiac tamponade.  6. The mitral valve is normal in  structure. No evidence of mitral valve  regurgitation.  7. Tricuspid valve regurgitation is mild to moderate.  8. The aortic valve is normal in structure. Aortic valve regurgitation is  not visualized.  9. The inferior vena cava is dilated in size with <50% respiratory  variability, suggesting right atrial pressure of 15 mmHg  Patient Profile     45 y.o. female with chronic systolic and diastolic heart failure admitted with acute on chronic systolic diastolic heart failure in the setting of recurrent SVT, Covid-19, anemia, and newly diagnosed pelvic mass with bilateral ureteral compression and hydronephrosis.  Assessment & Plan    # Acute on chronic systolic and diastolic heart failure: # Hypertension: LVEF 40-45%.   Euvolemic on exam.  Sodium is slowly increasing to 132 today., suspect mild overdiuresis.  Renal function is above baseline, also complicated by ureteral obstruction.  Continue to hold Lasix.  Continue BiDil and metoprolol.  No ARB at this time given worsening renal function.  BP well-controlled.     # SVT:  AVNRT.  Responded well to adenosine.  She has been offered ablation as an outpatient and did not show up.  Appreciate EP consultation 1/31.  Given her structural heart disease they recommended amiodarone.  Will load 400mg  bid x 7 days then 200 mg daily.  Check TFTs and LFTs in one month. PFTs as an outpatient.  TSH was 5.28 on 1/19.  Will repeat and add T3 and free T4.  # Pericardia effusion:  Unchanged from 04/2020.  No tamponode clinically.  # Iron deficiency anemia:  Thought to be  to menorrhagia vs GI.  # Pelvic mass:  Outpatient w/u pending.    # Vascular dementia/paranoid ideation:   # Acute renal failure: Stable.  Holding diuretics and ARB as above.      For questions or updates, please contact [place] Please consult [email or web address] for contact info under        Signed, Skeet Latch, MD  01/23/2021, 9:18 AM

## 2021-01-24 ENCOUNTER — Inpatient Hospital Stay (HOSPITAL_COMMUNITY): Payer: BLUE CROSS/BLUE SHIELD

## 2021-01-24 DIAGNOSIS — I471 Supraventricular tachycardia: Secondary | ICD-10-CM | POA: Diagnosis not present

## 2021-01-24 DIAGNOSIS — I472 Ventricular tachycardia: Secondary | ICD-10-CM | POA: Diagnosis not present

## 2021-01-24 DIAGNOSIS — F22 Delusional disorders: Secondary | ICD-10-CM | POA: Diagnosis not present

## 2021-01-24 DIAGNOSIS — R600 Localized edema: Secondary | ICD-10-CM

## 2021-01-24 DIAGNOSIS — N179 Acute kidney failure, unspecified: Secondary | ICD-10-CM | POA: Diagnosis not present

## 2021-01-24 DIAGNOSIS — I5082 Biventricular heart failure: Secondary | ICD-10-CM | POA: Diagnosis not present

## 2021-01-24 LAB — BASIC METABOLIC PANEL
Anion gap: 14 (ref 5–15)
BUN: 41 mg/dL — ABNORMAL HIGH (ref 6–20)
CO2: 25 mmol/L (ref 22–32)
Calcium: 9 mg/dL (ref 8.9–10.3)
Chloride: 92 mmol/L — ABNORMAL LOW (ref 98–111)
Creatinine, Ser: 1.75 mg/dL — ABNORMAL HIGH (ref 0.44–1.00)
GFR, Estimated: 36 mL/min — ABNORMAL LOW (ref >60)
Glucose, Bld: 118 mg/dL — ABNORMAL HIGH (ref 70–99)
Potassium: 4 mmol/L (ref 3.5–5.1)
Sodium: 131 mmol/L — ABNORMAL LOW (ref 135–145)

## 2021-01-24 LAB — CBC
HCT: 28.4 % — ABNORMAL LOW (ref 36.0–46.0)
Hemoglobin: 7.8 g/dL — ABNORMAL LOW (ref 12.0–15.0)
MCH: 18.7 pg — ABNORMAL LOW (ref 26.0–34.0)
MCHC: 27.5 g/dL — ABNORMAL LOW (ref 30.0–36.0)
MCV: 67.9 fL — ABNORMAL LOW (ref 80.0–100.0)
Platelets: 314 10*3/uL (ref 150–400)
RBC: 4.18 MIL/uL (ref 3.87–5.11)
RDW: 28.5 % — ABNORMAL HIGH (ref 11.5–15.5)
WBC: 8.8 10*3/uL (ref 4.0–10.5)
nRBC: 0 % (ref 0.0–0.2)

## 2021-01-24 LAB — BRAIN NATRIURETIC PEPTIDE: B Natriuretic Peptide: 696.8 pg/mL — ABNORMAL HIGH (ref 0.0–100.0)

## 2021-01-24 LAB — T3: T3, Total: 93 ng/dL (ref 71–180)

## 2021-01-24 IMAGING — US US RENAL
1 series · 14 of 25 positions shown · non-contrast
Comparison: None.

CLINICAL DATA: Acute renal injury

EXAM:
RENAL / URINARY TRACT ULTRASOUND COMPLETE

[Series 1: us renal · 14 of 35 slices shown]
[im 1/35]
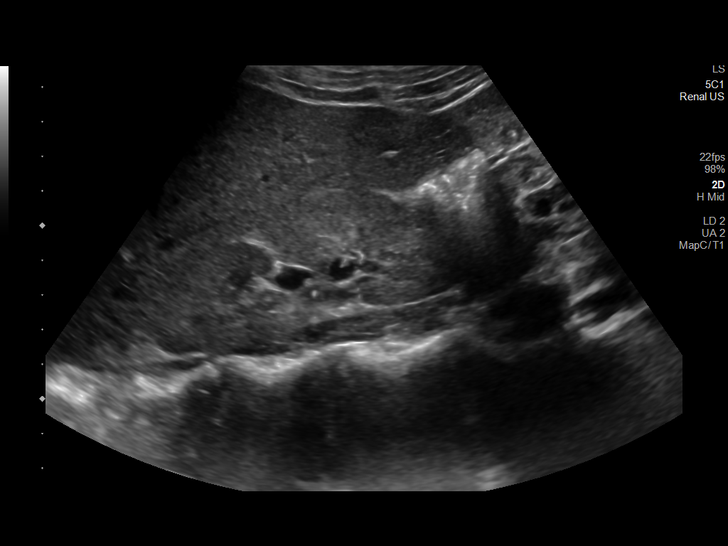
[im 3/35]
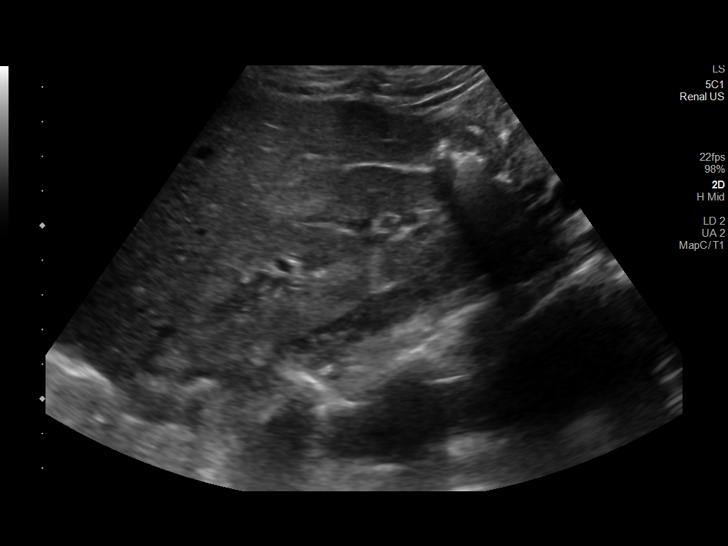
[im 6/35]
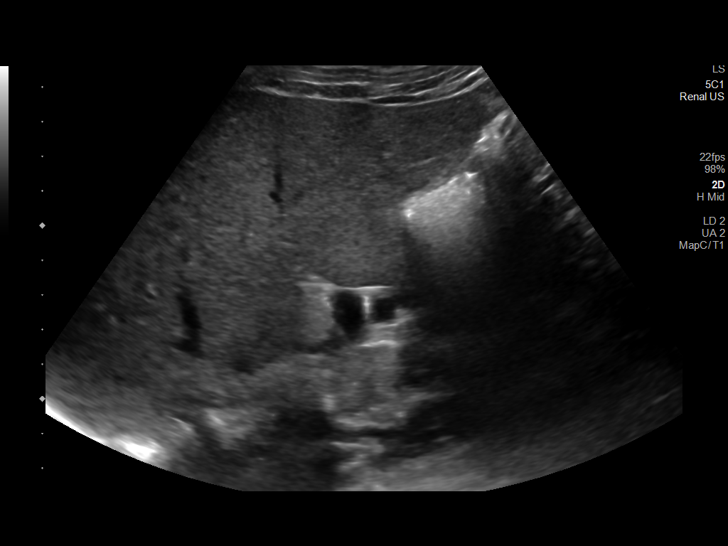
[im 9/35]
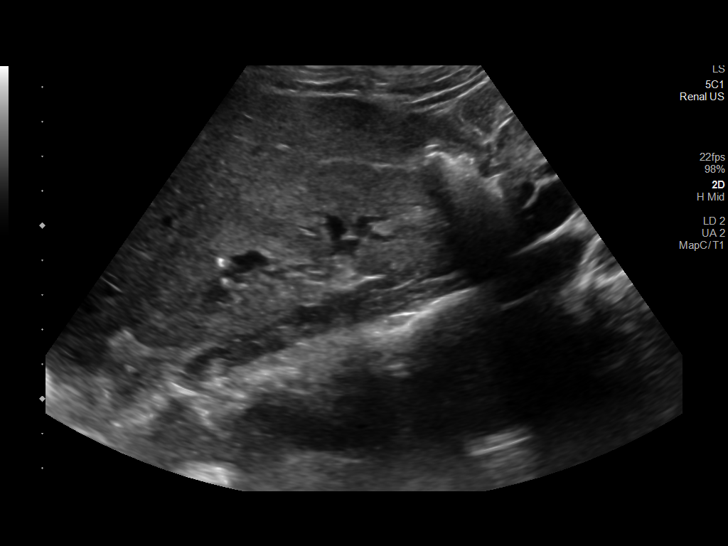
[im 12/35]
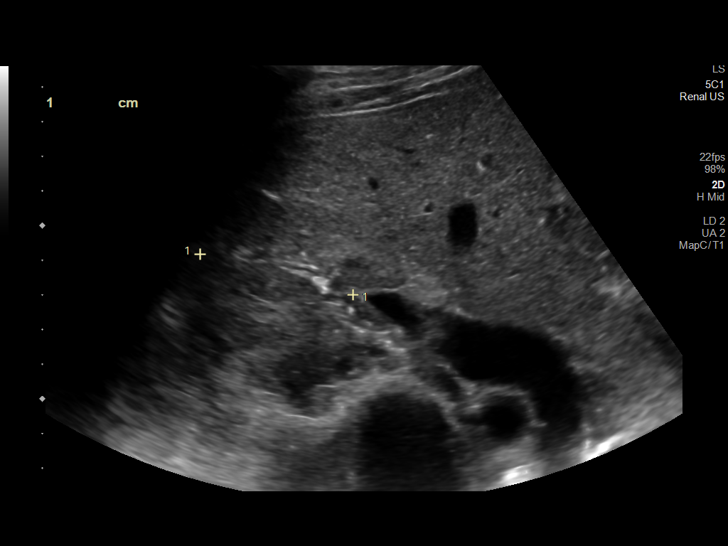
[im 13/35]
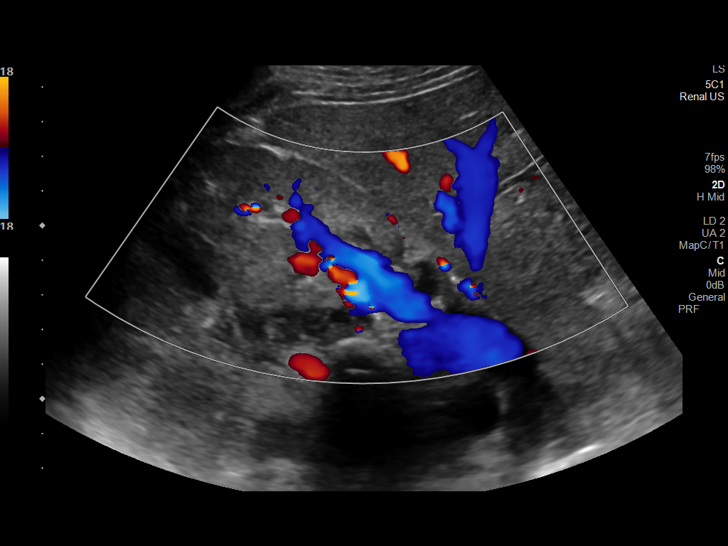
[im 16/35]
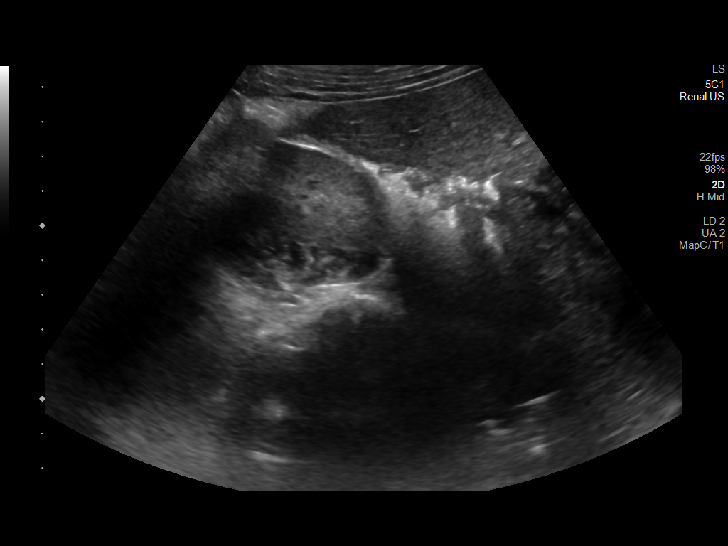
[im 19/35]
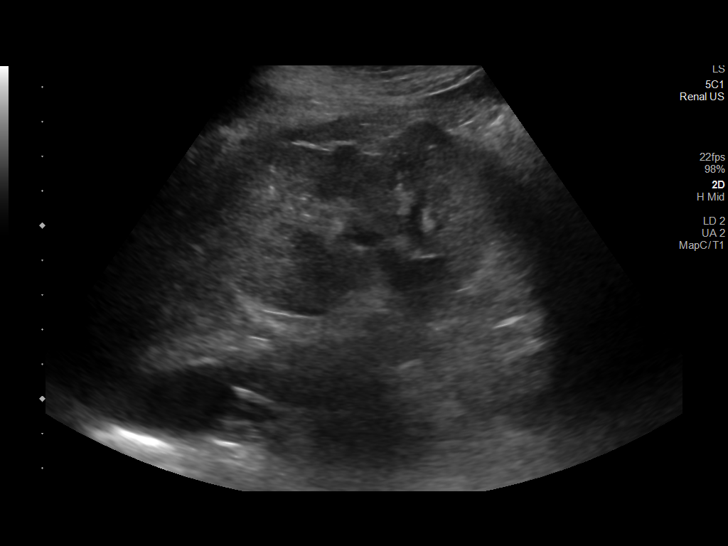
[im 22/35]
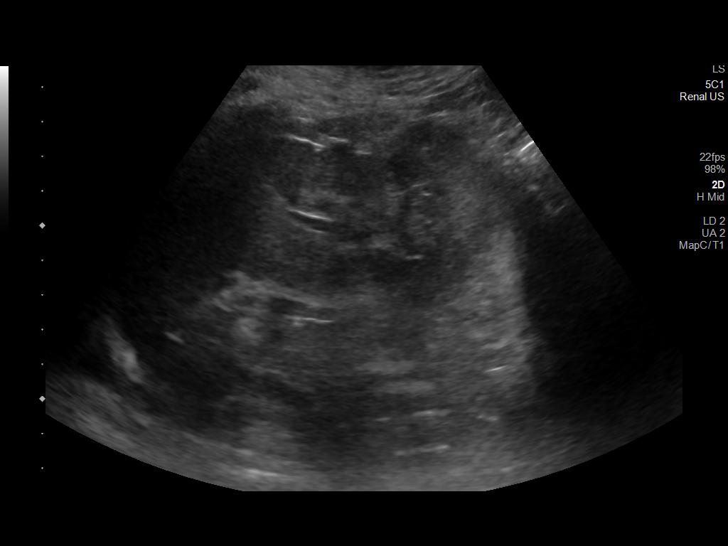
[im 23/35]
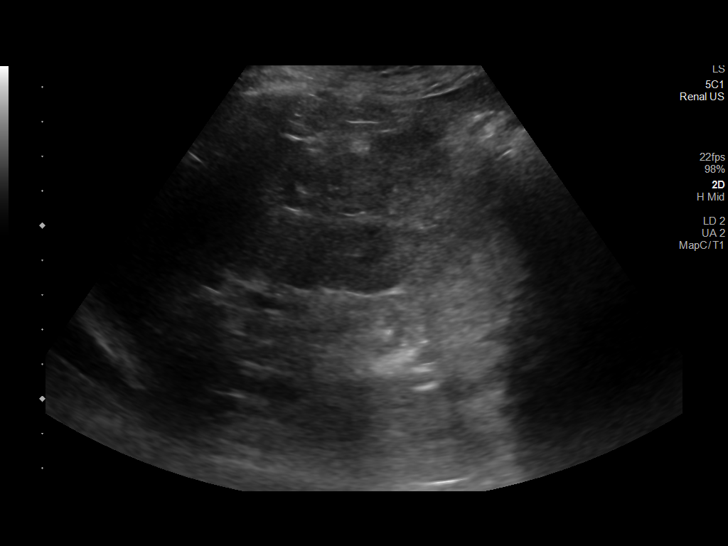
[im 26/35]
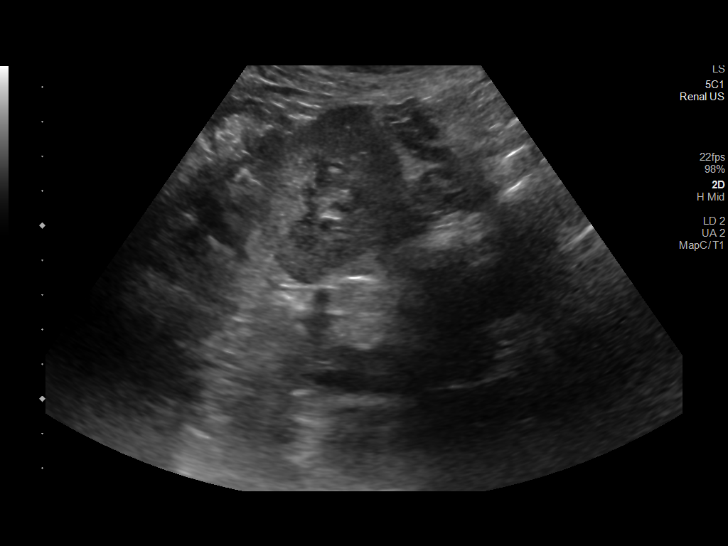
[im 29/35]
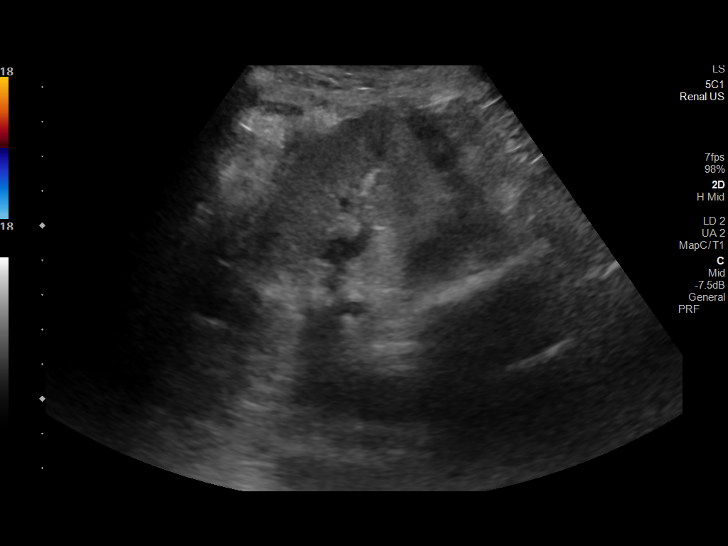
[im 32/35]
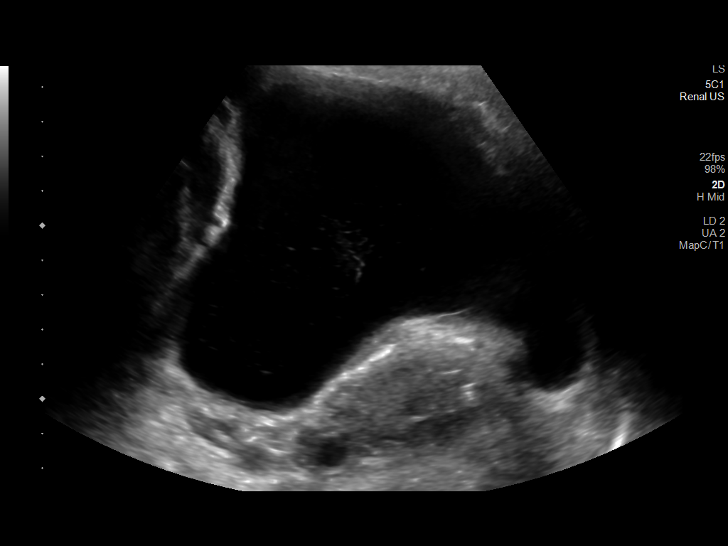
[im 35/35]
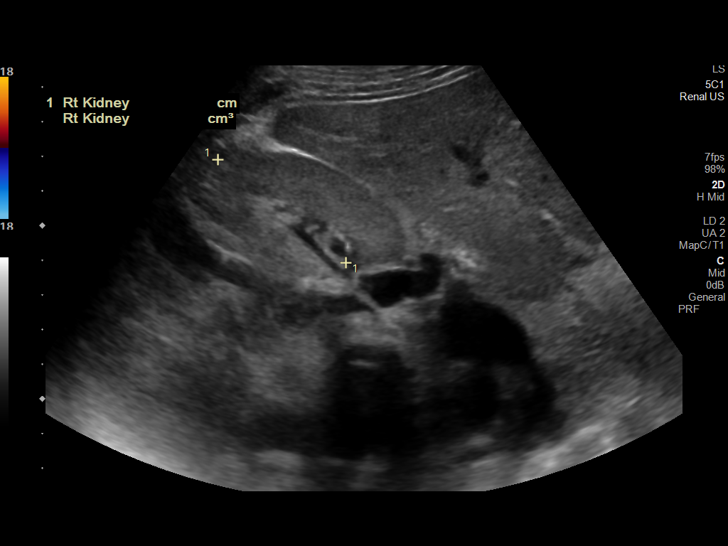

[14 of 25 positions shown; findings below may reference images not displayed]

FINDINGS: Right Kidney:

Renal measurements: 10 x 4 x 4.4 cm = volume: 90 mL. Echogenicity
within normal limits. No mass or hydronephrosis visualized.

Left Kidney:

Renal measurements: 8.6 x 3.8 x 4.0 cm. = volume: 68 mL.
Echogenicity within normal limits. No mass or hydronephrosis
visualized.

Bladder:

Appears normal for degree of bladder distention.

Other:

None.
IMPRESSION: No acute abnormality noted.

## 2021-01-24 MED ORDER — LACTATED RINGERS IV BOLUS
500.0000 mL | Freq: Once | INTRAVENOUS | Status: AC
  Administered 2021-01-24: 500 mL via INTRAVENOUS

## 2021-01-24 NOTE — Progress Notes (Addendum)
Progress Note  [name] Date of Encounter: 01/24/2021  [facility] Cardiologist: Glenetta Hew, MD   Subjective   No CP,  No palpitations or SOB.  Mentions slight nausea   Inpatient Medications    Scheduled Meds: . [START ON 01/29/2021] amiodarone  200 mg Oral Daily  . amiodarone  400 mg Oral BID  . ARIPiprazole  2 mg Oral QHS  . enoxaparin (LOVENOX) injection  40 mg Subcutaneous Q24H  . isosorbide-hydrALAZINE  2 tablet Oral TID  . metoprolol succinate  200 mg Oral Daily   Continuous Infusions:  PRN Meds: acetaminophen, mineral oil-hydrophilic petrolatum   Vital Signs    Vitals:   01/23/21 1600 01/23/21 1923 01/24/21 0023 01/24/21 0318  BP: 119/71 115/69  128/75  Pulse:      Resp:  16  19  Temp:  98.7 F (37.1 C)  98.2 F (36.8 C)  TempSrc:  Oral  Oral  SpO2:  97%  98%  Weight:   56.3 kg   Height:        Intake/Output Summary (Last 24 hours) at 01/24/2021 0824 Last data filed at 01/24/2021 0025 Gross per 24 hour  Intake 237 ml  Output 500 ml  Net -263 ml   Last 3 Weights 01/24/2021 01/23/2021 01/22/2021  Weight (lbs) 124 lb 3.2 oz 123 lb 14.4 oz 123 lb 0.3 oz  Weight (kg) 56.337 kg 56.2 kg 55.8 kg      Telemetry    Sinus less PVCs, one nonsustained WCT rhythm, 18beats,  no SVT - Personally Reviewed  ECG    No new EKGs - Personally Reviewed  Physical Exam   VS:  BP 128/75 (BP Location: Left Arm)   Pulse 92   Temp 98.2 F (36.8 C) (Oral)   Resp 19   Ht 5\' 6"  (1.676 m)   Wt 56.3 kg   SpO2 98%   BMI 20.05 kg/m  , BMI Body mass index is 20.05 kg/m. Well developed and nourished in no acute distress HENT normal Neck supple  CTA b/l RRR, no murmurs, gallops, or oubs Abd-soft, nontender No Clubbing cyanosis edema Skin-warm and dry A & Oriented  Grossly normal sensory and motor function     Labs    High Sensitivity Troponin:   Recent Labs  Lab 01/10/21 1449 01/10/21 1600 01/10/21 1927  TROPONINIHS 802* 922* 888*       Chemistry Recent Labs  Lab 01/22/21 0301 01/23/21 0646 01/24/21 0414  NA 131* 132* 131*  K 5.0 4.1 4.0  CL 93* 94* 92*  CO2 22 25 25   GLUCOSE 72 98 118*  BUN 36* 35* 41*  CREATININE 1.37* 1.51* 1.75*  CALCIUM 8.6* 8.8* 9.0  GFRNONAA 48* 43* 36*  ANIONGAP 16* 13 14     Hematology Recent Labs  Lab 01/20/21 0431 01/21/21 0139 01/24/21 0414  WBC 9.3 10.4 8.8  RBC 4.87 4.44 4.18  HGB 8.9* 8.4* 7.8*  HCT 33.0* 29.6* 28.4*  MCV 67.8* 66.7* 67.9*  MCH 18.3* 18.9* 18.7*  MCHC 27.0* 28.4* 27.5*  RDW 26.9* 27.7* 28.5*  PLT 181 213 314    BNPNo results for input(s): BNP, PROBNP in the last 168 hours.   DDimer No results for input(s): DDIMER in the last 168 hours.   Radiology    No results found.  Cardiac Studies   ECHO 01/11/2021 1. Left ventricular ejection fraction, by estimation, is 20 to 25%. The  left ventricle has severely decreased function. The left ventricle  demonstrates global hypokinesis. The left ventricular internal cavity size  was mildly to moderately dilated. There  is moderate concentric left ventricular hypertrophy. Left ventricular  diastolic parameters are consistent with Grade II diastolic dysfunction  (pseudonormalization). Elevated left ventricular end-diastolic pressure.  The average left ventricular global  longitudinal strain is -5.8 %. The global longitudinal strain is abnormal.  2. Right ventricular systolic function is moderately reduced. The right  ventricular size is normal. There is moderately elevated pulmonary artery  systolic pressure.  3. Left atrial size was mildly dilated.  4. Right atrial size was mildly dilated.  5. Moderate pericardial effusion. The pericardial effusion is  circumferential. There is no evidence of cardiac tamponade.  6. The mitral valve is normal in structure. Mild mitral valve  regurgitation. No evidence of mitral stenosis.  7. The aortic valve is tricuspid. Aortic valve regurgitation is not   visualized. No aortic stenosis is present.  8. The inferior vena cava is dilated in size with <50% respiratory  variability, suggesting right atrial pressure of 15 mmHg.    ECHO 05/02/2020  1. Left ventricular ejection fraction, by estimation, is 40 to 45%. The  left ventricle has mildly decreased function. The left ventricle  demonstrates global hypokinesis. There is mild concentric left ventricular  hypertrophy. Left ventricular diastolic  parameters are consistent with Grade II diastolic dysfunction  (pseudonormalization). Elevated left atrial pressure.  2. Right ventricular systolic function is low normal. The right  ventricular size is normal. There is moderately elevated pulmonary artery  systolic pressure.  3. Left atrial size was mildly dilated.  4. Right atrial size was mildly dilated.  5. Moderate pericardial effusion. The pericardial effusion is  circumferential. There is no evidence of cardiac tamponade.  6. The mitral valve is normal in structure. No evidence of mitral valve  regurgitation.  7. Tricuspid valve regurgitation is mild to moderate.  8. The aortic valve is normal in structure. Aortic valve regurgitation is  not visualized.  9. The inferior vena cava is dilated in size with <50% respiratory  variability, suggesting right atrial pressure of 15 mmHg  Patient Profile     45 y.o. female with chronic systolic and diastolic heart failure admitted with acute on chronic systolic diastolic heart failure in the setting of recurrent SVT, Covid-19, anemia, and newly diagnosed pelvic mass with bilateral ureteral compression and hydronephrosis.  Assessment & Plan      SVT-recurrent short RP with 2 different RP intervals     None further  Ventricular tachycardia-nonsustained     One yesterday morning 18beats, initially about 150bpm > slowed to 70's and stopped     Pt tells me she has had a general vague sense of nausea for a couple weeks, not new, perhaps  better  Cardiomyopathy-severe left sided involvement without right-sided involvement  PVCs-monomorphic averaging approximately 5% (rough estimate)      improved  Anemia-chronic  Paranoia/psychosis-recurrent  Question dementia   Dr. Caryl Comes will see later this morning, though I anticipate would be agreeable to discharge from an EP perspective I understand she has a bed at in-patient psych for today    For questions or updates, please contact [place] Please consult [email or web address] for contact info under        Signed, Baldwin Jamaica, PA-C  01/24/2021, 8:24 AM     Continue amiodarone 400 twice daily x2 weeks.  Then reduce to 400 mg daily x2 weeks.  Then 200 mg a day.  Will need cardiac follow-up in about  6 weeks.  PVC burden is markedly decreased 50+ percent; no interval SVT

## 2021-01-24 NOTE — Progress Notes (Signed)
Heart Failure Stewardship Pharmacist Progress Note   PCP: Patient, No Pcp Per PCP-Cardiologist: Glenetta Hew, MD    HPI:  45 yo F with PMH of chronic systolic and diastolic HF, HTN, uterine fibroids, and history of SVT. She presented to the ED for evaluation of weakness, and was found to have SVT, acute on chronic heart failure, hypertensive emergency, AKI, and a COVID-19 infection. An ECHO was done on 01/11/21 and LVEF severely decreased to 20-25% (down from 40-45% in May 2021). Has had recurrent SVT throughout this admission. Not deemed to be a great candidate for ablation given her comorbidities by cardiology. EP has been consulted and recommend medial management with amiodarone.   Current HF Medications: Metoprolol XL 200 mg daily  BiDil 20/37.5 mg 2 tabs TID  Prior to admission HF Medications: None - not taking any medications  Pertinent Lab Values: . Serum creatinine 1.75, BUN 41, Potassium 4.0, Sodium 131, BNP >4500, Magnesium 2.0  Vital Signs: . Weight: 124 lbs (admission weight: 142 lbs) . Blood pressure: 110-120/70s  . Heart rate: 90s  Medication Assistance / Insurance Benefits Check: Does the patient have prescription insurance?  Yes Type of insurance plan: Medicaid (out-of-state)   Outpatient Pharmacy:  Prior to admission outpatient pharmacy: [facility] Is the patient willing to use [facility] at discharge? Yes   Assessment: 1. Acute on chronic systolic CHF (EF 53-61%), likely secondary to medication noncompliance, SVT, and COVID-19 infection. NYHA class III symptoms. - Furosemide stopped on 1/30 due to bump in SCr and possible overdiuresis - Continue metoprolol XL 200 mg daily - Losartan stopped on 1/30 due to worsening renal function - Spironolactone stopped on 1/30 due to worsening renal function. If able to resume, would only resume at 25 mg daily instead of 50 mg daily - Continue BiDil 20/27.5 mg 2 tabs TID   Plan: 1) Medication changes recommended at this  time: - SCr still continues to climb, continue current therapy as we are unable to restart losartan or spironolactone at this time  2) Patient assistance application(s): - None pending  3)  Education  - To be completed prior to discharge - Appreciate assistance from [place], PharmD, BCPS Heart Failure Cytogeneticist Phone [phone]

## 2021-01-24 NOTE — Progress Notes (Signed)
Subjective:   Overnight: No acute events  Holly Bradley was seen at bedside this AM. She states that she is doing well. She did have some nausea last night that has since resolved. She denies palpitations, vomiting, chest pain, or fevers.   Objective: Vital signs in last 24 hours: Vitals:   01/23/21 1600 01/23/21 1923 01/24/21 0023 01/24/21 0318  BP: 119/71 115/69  128/75  Pulse:      Resp:  16  19  Temp:  98.7 F (37.1 C)  98.2 F (36.8 C)  TempSrc:  Oral  Oral  SpO2:  97%  98%  Weight:   56.3 kg   Height:       Physical Exam Constitutional:      Appearance: Normal appearance.  HENT:     Head: Normocephalic and atraumatic.  Cardiovascular:     Rate and Rhythm: Normal rate and regular rhythm.     Pulses: Normal pulses.     Heart sounds: No murmur heard. No friction rub. No gallop.   Pulmonary:     Effort: Pulmonary effort is normal.     Breath sounds: Normal breath sounds. No wheezing, rhonchi or rales.  Abdominal:     General: Abdomen is flat. Bowel sounds are normal.     Palpations: Abdomen is soft.     Tenderness: There is no abdominal tenderness. There is no guarding.     Hernia: No hernia is present.  Skin:    General: Skin is warm and dry.     Findings: No bruising, erythema or lesion.  Neurological:     Mental Status: She is alert and oriented to person, place, and time.  Psychiatric:        Mood and Affect: Mood normal.        Behavior: Behavior normal.      Intake/Output Summary (Last 24 hours) at 01/24/2021 0601 Last data filed at 01/24/2021 0025 Gross per 24 hour  Intake 237 ml  Output 500 ml  Net -263 ml   Filed Weights   01/22/21 0600 01/23/21 0327 01/24/21 0023  Weight: 55.8 kg 56.2 kg 56.3 kg  Labs in last 24 hours: CBC Latest Ref Rng & Units 01/24/2021 01/21/2021 01/20/2021  WBC 4.0 - 10.5 K/uL 8.8 10.4 9.3  Hemoglobin 12.0 - 15.0 g/dL 7.8(L) 8.4(L) 8.9(L)  Hematocrit 36.0 - 46.0 % 28.4(L) 29.6(L) 33.0(L)  Platelets 150 - 400 K/uL 314 213  181   BMP Latest Ref Rng & Units 01/24/2021 01/23/2021 01/22/2021  Glucose 70 - 99 mg/dL 118(H) 98 72  BUN 6 - 20 mg/dL 41(H) 35(H) 36(H)  Creatinine 0.44 - 1.00 mg/dL 1.75(H) 1.51(H) 1.37(H)  Sodium 135 - 145 mmol/L 131(L) 132(L) 131(L)  Potassium 3.5 - 5.1 mmol/L 4.0 4.1 5.0  Chloride 98 - 111 mmol/L 92(L) 94(L) 93(L)  CO2 22 - 32 mmol/L 25 25 22   Calcium 8.9 - 10.3 mg/dL 9.0 8.8(L) 8.6(L)   Imaging in last 24 hours: No results found. Assessment/Plan:  Principal Problem:   Acute exacerbation of CHF (congestive heart failure) (HCC) Active Problems:   CHF (congestive heart failure) ([place])   Pneumonia due to COVID-19 virus   Acute renal failure (HCC)   Anemia   Pelvic mass in female   Hypertensive emergency   Fibroid   Menorrhagia   Bilateral leg edema   Delirium   Fever in adult   Psychosis [facility])  Holly Bradley is a 45 year old female with past medical history significant for HTN, combined systolic and  diastolic heart failure, paroxysmal SVT, and massive uterine fibroids complicated by menometrorrhagia and chronic blood loss anemia who presented to [facility] on 01/10/21 for evaluation of weaknessfound to be in SVT with acute on chronic biventricular heart failure,hypertensive emergency,AKI, COVID-19 infection and 23cm uterine mass filling peritoneal cavity with compression of venous structures and ureters with hospital course complicated by ongoing paranoia and psychosis.  #Biventricular heart failure, chronic #Moderate pericardial effusion, chronic #Hypertension, chronic Diuretics and losartan held due to concern for overdiuresis with worsening renal function.  -Cardiology following, appreciate recommendations -Continue holding furosemide, losartan and spironolactone -Continue two tablets of isosorbide-hydralazine 20-37.5mg  three times daily -Continue metoprolol 200mg  XL daily  -Strict intake/output -Daily weights  #AVNRT,recurrent  #Non-sustained ventricular  tachycardia, recurrent Patient has had no overnight episodes of SVT for the past 48 hours and doing well on Amiodarone. Not an ideal ablation candidate.   -EP and Cardiology consulted, appreciate recommendations  - Amiodarone 400 mg BID for 7 days then 200 mg daily (start 01/23/20)    - TFTs and LFTs in one month    -  Will trend thyroid levels   - TSH: 1.95   - T3: 93   - T4: 1.18  -Continue metoprolol 200mg  XL daily  #Paranoia and psychosis, active Psychiatry evaluated and recommended initiation of antipsychotic and discharge to inpatient psychiatry when medically cleared. Due to patient's recurrent episodes of SVT, she is not stable for discharge at this time. -Continue aripiprazole 2mg  daily -Discharge to inpatient psychiatry when medically clear  AKI:  Patient's creatinine continues to worsen. 1.75 today from 1.51 yesterday. Likely in the setting of over diuresis. Continue to hold losartan and diuretics. Will give a light bolus today to see if there is improvement in her kidney function.  - 500 mL LR Bolus - Continuing to hold nephrotoxic agents  #Large uterine fibroids, chronic #Iron deficiency anemia2/2 menometrorrhagia, chronic Patient noted to have 23cm pelvic mass on CT Abdomen/Pelvis stable from MRI in 2019. MRI from 2019 revealed compression of right common iliac artery & vein, likely compression of IVC and left common iliac vein as well, and bladder. -Consulted GYN Onc who plans to follow-up patient in outpatient setting -GYN consulted and administered Depo provera on 01/12/21 -GYN recommended calling when period starts for consideration of starting PO medications -Daily CBC  #Vascular dementia, active. -Optimize medical conditions as listed above   #VTE ppx: enoxaparin 40mg  daily #IVF: None #Diet: Heart healthy #Code status: Full code #PT/OT recs: PT has signed off. No OT follow up, no equipment recommendations. #Dispo: Anticipated discharge to inpatient  psychiatry  Holly Mercury, MD 01/24/2021, 6:01 AM Pager: [phone] After 5pm on weekdays and 1pm on weekends: On Call Pager: [phone]

## 2021-01-24 NOTE — Progress Notes (Signed)
Progress Note  [name] Date of Encounter: 01/24/2021  [facility] Cardiologist: Glenetta Hew, MD   Subjective   Feeling well.  Denies chest pain or shortness of breath.   Inpatient Medications    Scheduled Meds: . [START ON 01/29/2021] amiodarone  200 mg Oral Daily  . amiodarone  400 mg Oral BID  . ARIPiprazole  2 mg Oral QHS  . enoxaparin (LOVENOX) injection  40 mg Subcutaneous Q24H  . isosorbide-hydrALAZINE  2 tablet Oral TID  . metoprolol succinate  200 mg Oral Daily   Continuous Infusions:  PRN Meds: acetaminophen, mineral oil-hydrophilic petrolatum   Vital Signs    Vitals:   01/23/21 1923 01/24/21 0023 01/24/21 0318 01/24/21 0800  BP: 115/69  128/75 112/68  Pulse:      Resp: 16  19 20   Temp: 98.7 F (37.1 C)  98.2 F (36.8 C)   TempSrc: Oral  Oral   SpO2: 97%  98% 97%  Weight:  56.3 kg    Height:        Intake/Output Summary (Last 24 hours) at 01/24/2021 0923 Last data filed at 01/24/2021 0920 Gross per 24 hour  Intake 477 ml  Output 500 ml  Net -23 ml   Last 3 Weights 01/24/2021 01/23/2021 01/22/2021  Weight (lbs) 124 lb 3.2 oz 123 lb 14.4 oz 123 lb 0.3 oz  Weight (kg) 56.337 kg 56.2 kg 55.8 kg      Telemetry    Sinus rhythm.  PVCs - Personally Reviewed  ECG    01/21/21: SVT.  Rate 163 bpm.   - Personally Reviewed  Physical Exam   VS:  BP 112/68 (BP Location: Left Arm)   Pulse 92   Temp 98.2 F (36.8 C) (Oral)   Resp 20   Ht 5\' 6"  (1.676 m)   Wt 56.3 kg   SpO2 97%   BMI 20.05 kg/m  , BMI Body mass index is 20.05 kg/m. GENERAL:  Well appearing HEENT: Pupils equal round and reactive, fundi not visualized, oral mucosa unremarkable NECK:  No jugular venous distention, waveform within normal limits, carotid upstroke brisk and symmetric, no bruits LUNGS:  Clear to auscultation bilaterally HEART:  RRR.  PMI not displaced or sustained,S1 and S2 within normal limits, no S3, no S4, no clicks, no rubs, no murmurs ABD:  Distended, positive bowel sounds normal in frequency in pitch, no bruits, no rebound, no guarding, no midline pulsatile mass, no hepatomegaly, no splenomegaly EXT:  2 plus pulses throughout, no edema, no cyanosis no clubbing SKIN:  No rashes no nodules NEURO:  Cranial nerves II through XII grossly intact, motor grossly intact throughout PSYCH:  Cognitively intact, oriented to person place and time  Labs    High Sensitivity Troponin:   Recent Labs  Lab 01/10/21 1449 01/10/21 1600 01/10/21 1927  TROPONINIHS 802* 922* 888*      Chemistry Recent Labs  Lab 01/22/21 0301 01/23/21 0646 01/24/21 0414  NA 131* 132* 131*  K 5.0 4.1 4.0  CL 93* 94* 92*  CO2 22 25 25   GLUCOSE 72 98 118*  BUN 36* 35* 41*  CREATININE 1.37* 1.51* 1.75*  CALCIUM 8.6* 8.8* 9.0  GFRNONAA 48* 43* 36*  ANIONGAP 16* 13 14     Hematology Recent Labs  Lab 01/20/21 0431 01/21/21 0139 01/24/21 0414  WBC 9.3 10.4 8.8  RBC 4.87 4.44 4.18  HGB 8.9* 8.4* 7.8*  HCT 33.0* 29.6* 28.4*  MCV 67.8* 66.7* 67.9*  MCH 18.3* 18.9*  18.7*  MCHC 27.0* 28.4* 27.5*  RDW 26.9* 27.7* 28.5*  PLT 181 213 314    BNPNo results for input(s): BNP, PROBNP in the last 168 hours.   DDimer No results for input(s): DDIMER in the last 168 hours.   Radiology    No results found.  Cardiac Studies   ECHO 01/11/2021 1. Left ventricular ejection fraction, by estimation, is 20 to 25%. The  left ventricle has severely decreased function. The left ventricle  demonstrates global hypokinesis. The left ventricular internal cavity size  was mildly to moderately dilated. There  is moderate concentric left ventricular hypertrophy. Left ventricular  diastolic parameters are consistent with Grade II diastolic dysfunction  (pseudonormalization). Elevated left ventricular end-diastolic pressure.  The average left ventricular global  longitudinal strain is -5.8 %. The global longitudinal strain is abnormal.  2. Right ventricular  systolic function is moderately reduced. The right  ventricular size is normal. There is moderately elevated pulmonary artery  systolic pressure.  3. Left atrial size was mildly dilated.  4. Right atrial size was mildly dilated.  5. Moderate pericardial effusion. The pericardial effusion is  circumferential. There is no evidence of cardiac tamponade.  6. The mitral valve is normal in structure. Mild mitral valve  regurgitation. No evidence of mitral stenosis.  7. The aortic valve is tricuspid. Aortic valve regurgitation is not  visualized. No aortic stenosis is present.  8. The inferior vena cava is dilated in size with <50% respiratory  variability, suggesting right atrial pressure of 15 mmHg.   ECHO 05/02/2020  1. Left ventricular ejection fraction, by estimation, is 40 to 45%. The  left ventricle has mildly decreased function. The left ventricle  demonstrates global hypokinesis. There is mild concentric left ventricular  hypertrophy. Left ventricular diastolic  parameters are consistent with Grade II diastolic dysfunction  (pseudonormalization). Elevated left atrial pressure.  2. Right ventricular systolic function is low normal. The right  ventricular size is normal. There is moderately elevated pulmonary artery  systolic pressure.  3. Left atrial size was mildly dilated.  4. Right atrial size was mildly dilated.  5. Moderate pericardial effusion. The pericardial effusion is  circumferential. There is no evidence of cardiac tamponade.  6. The mitral valve is normal in structure. No evidence of mitral valve  regurgitation.  7. Tricuspid valve regurgitation is mild to moderate.  8. The aortic valve is normal in structure. Aortic valve regurgitation is  not visualized.  9. The inferior vena cava is dilated in size with <50% respiratory  variability, suggesting right atrial pressure of 15 mmHg  Patient Profile     45 y.o. female with chronic systolic and diastolic heart  failure admitted with acute on chronic systolic diastolic heart failure in the setting of recurrent SVT, COVID-19, anemia, and newly diagnosed pelvic mass with bilateral ureteral compression and hydronephrosis.  Assessment & Plan    # Acute on chronic systolic and diastolic heart failure: # Hypertension: # Acute renal failure: LVEF 40-45%.   Euvolemic on exam.  She is able to lay flat and has no edema.  She has persistent hyponatremia but doesn't seem to be hypervolemic.  Renal functioning worsening and it doesn't seem to be due to overdiuresis.  May need to touch base with nephrology before discharge.  Renal function is above baseline, also complicated by ureteral obstruction.  Continue to hold Lasix.  Continue BiDil and metoprolol.  No ARB at this time given worsening renal function.  BP well-controlled.     #  SVT:  # NSVT:  AVNRT.  Responded well to adenosine.  She has been offered ablation as an outpatient and did not show up.  Appreciate EP consultation 1/31.  Given her structural heart disease they recommended amiodarone.  Will load 400mg  bid x 7 days then 200 mg daily.  Check TFTs and LFTs in one month. PFTs as an outpatient.  Thyroid function stable on repeat.  She had an episode of NSVT overnight.  Continue to load amiodarone.   # Pericardia effusion:  Unchanged from 04/2020.  No tamponode clinically.  # Iron deficiency anemia:  Thought to be to menorrhagia vs GI.  # Pelvic mass:  Outpatient w/u pending.    # Vascular dementia/paranoid ideation:        For questions or updates, please contact [place] Please consult [email or web address] for contact info under        Signed, Skeet Latch, MD  01/24/2021, 9:23 AM

## 2021-01-25 DIAGNOSIS — F29 Unspecified psychosis not due to a substance or known physiological condition: Secondary | ICD-10-CM

## 2021-01-25 LAB — CBC
HCT: 26.5 % — ABNORMAL LOW (ref 36.0–46.0)
Hemoglobin: 7.8 g/dL — ABNORMAL LOW (ref 12.0–15.0)
MCH: 19.7 pg — ABNORMAL LOW (ref 26.0–34.0)
MCHC: 29.4 g/dL — ABNORMAL LOW (ref 30.0–36.0)
MCV: 66.9 fL — ABNORMAL LOW (ref 80.0–100.0)
Platelets: 408 10*3/uL — ABNORMAL HIGH (ref 150–400)
RBC: 3.96 MIL/uL (ref 3.87–5.11)
RDW: 28.8 % — ABNORMAL HIGH (ref 11.5–15.5)
WBC: 7.9 10*3/uL (ref 4.0–10.5)
nRBC: 0 % (ref 0.0–0.2)

## 2021-01-25 LAB — BASIC METABOLIC PANEL
Anion gap: 11 (ref 5–15)
BUN: 38 mg/dL — ABNORMAL HIGH (ref 6–20)
CO2: 28 mmol/L (ref 22–32)
Calcium: 9.1 mg/dL (ref 8.9–10.3)
Chloride: 93 mmol/L — ABNORMAL LOW (ref 98–111)
Creatinine, Ser: 1.64 mg/dL — ABNORMAL HIGH (ref 0.44–1.00)
GFR, Estimated: 39 mL/min — ABNORMAL LOW (ref >60)
Glucose, Bld: 105 mg/dL — ABNORMAL HIGH (ref 70–99)
Potassium: 4.1 mmol/L (ref 3.5–5.1)
Sodium: 132 mmol/L — ABNORMAL LOW (ref 135–145)

## 2021-01-25 MED ORDER — ENSURE ENLIVE PO LIQD
237.0000 mL | Freq: Two times a day (BID) | ORAL | Status: DC
  Administered 2021-01-26 – 2021-01-27 (×3): 237 mL via ORAL

## 2021-01-25 MED ORDER — FUROSEMIDE 40 MG PO TABS
40.0000 mg | ORAL_TABLET | Freq: Every day | ORAL | Status: DC
  Administered 2021-01-25: 40 mg via ORAL
  Filled 2021-01-25 (×2): qty 1

## 2021-01-25 MED ORDER — ADULT MULTIVITAMIN W/MINERALS CH
1.0000 | ORAL_TABLET | Freq: Every day | ORAL | Status: DC
  Administered 2021-01-26 – 2021-01-27 (×2): 1 via ORAL
  Filled 2021-01-25 (×2): qty 1

## 2021-01-25 NOTE — Progress Notes (Addendum)
Subjective: HD#15   Overnight: No acute overnight events reported.  Today, Holly Bradley was evaluated sitting in the bedside recliner.  She denies chest pain, abdominal pain, palpitation.  She does endorse some nausea but does have a good appetite without emesis.  During our evaluation, she was noted to be tachycardic on the cardiac monitor however her radial pulse and a portable pulse oximeter read her pulse at 67.  The artificial tachycardia on the cardiac monitor was probably due to the peak T waves been mistaking as a QRS by the computer.   Objective:  Vital signs in last 24 hours: Vitals:   01/24/21 0800 01/24/21 1200 01/24/21 1920 01/25/21 0538  BP: 112/68 105/65 129/77 119/85  Pulse:  99 85 84  Resp: 20 19 18 20   Temp:   (!) 97.2 F (36.2 C) 98 F (36.7 C)  TempSrc:   Oral Oral  SpO2: 97% 95% 97% 98%  Weight:    56.7 kg  Height:       Const: In no apparent distress, lying comfortably in bed, conversational Resp: CTA BL, no wheezes, crackles, rhonchi CV: RRR, no murmurs, gallop, rub Abd: Bowel sounds present, firm lower abdominal mass palpated   Assessment/Plan:  Principal Problem:   Acute exacerbation of CHF (congestive heart failure) (HCC) Active Problems:   CHF (congestive heart failure) ([place])   Pneumonia due to COVID-19 virus   Acute renal failure (HCC)   Anemia   Pelvic mass in female   Hypertensive emergency   Fibroid   Menorrhagia   Bilateral leg edema   Delirium   Fever in adult   Psychosis [facility])  Holly Bradley is a 45 year old female with past medical history significant for HTN, combined systolic and diastolic heart failure, paroxysmal SVT, and massive uterine fibroids complicated by menometrorrhagia and chronic blood loss anemia who presented to [facility] on 01/10/21 for evaluation of weaknessfound to be in SVT with acute on chronic biventricular heart failure,hypertensive emergency,AKI, COVID-19 infection and 23cm uterine mass filling  peritoneal cavity with compression of venous structures and ureters with hospital course complicated by ongoing paranoia and psychosis.  #Biventricular heart failure, chronic (EF 20 to 25%) #Moderate pericardial effusion, chronic #Hypertension, chronic Weight this morning 125 pounds from 124 pounds yesterday.  Total intake and output positive 240 cc.  It is reassuring that her weight is stable and she has no signs of volume overload. -Cardiology following, appreciate recommendations  *Resume home Lasix 40mg  daily p.o   *Hold losartan and spironolactone  *Continue isosorbide-hydralazine 20-37.5mg  three times daily  *Continue metoprolol 200mg  XL daily  -Strict intake/output -Daily weights  #AVNRT,recurrent  #Non-sustained ventricular tachycardia, recurrent Stable.  Artificial intermittent tachycardia noted on the cardiac monitor however pulse was 67 on recheck with portable pulse oximeter as well as radial pulse. -EP and Cardiology consulted, appreciate recommendations             - Amiodarone 400 mg BID for 7 days then 200 mg daily (start 01/23/20)                          - TFTs and LFTs in one month                          -  Will trend thyroid levels                         -  TSH: 1.95                         - T3: 93                         - T4: 1.18  -Continue metoprolol 200mg  XL daily  #Paranoia and psychosis, active Psychiatry evaluated and recommended initiation of antipsychotic and discharge to inpatient psychiatry when medically cleared. Due to patient's recurrent episodes of SVT, she is not stable for discharge at this time. -Continue aripiprazole 2mg  daily -Discharge to inpatient psychiatry when medically clear  #AKI:  sCr 1.6<<1.7 (1-1.2 on 12/24 & 12/25). Bladder scan 215cc - s/p 500 mL LR Bolus - Resume home Lasix 40mg  p.o - Continuing to hold nephrotoxic agents  #Large uterine fibroids, chronic #Iron deficiency anemia2/2 menometrorrhagia, chronic Patient  noted to have 23cm pelvic mass on CT Abdomen/Pelvis stable from MRI in 2019. MRI from 2019 revealed compression of right common iliac artery & vein, likely compression of IVC and left common iliac vein as well, and bladder. -Consulted GYN Onc who plans to follow-up patient in outpatient setting -GYN consulted and administered Depo provera on 01/12/21 -GYN recommended calling when period starts for consideration of starting PO medications -Daily CBC  #Vascular dementia, active. -Optimize medical conditions as listed above   #VTE PYK:[id number] 40mg  daily #IVF:None #Diet:Heart healthy #Code status:Full code #PT/OT recs:PT has signed off. No OT follow up, no equipment recommendations. #Dispo: Anticipated discharge to inpatient psychiatry    Jean Rosenthal, MD 01/25/2021, 6:37 AM Pager: [phone] Internal Medicine Teaching Service After 5pm on weekdays and 1pm on weekends: On Call pager: [phone]

## 2021-01-25 NOTE — Progress Notes (Signed)
Initial Nutrition Assessment  DOCUMENTATION CODES:   Not applicable  INTERVENTION:   -Ensure Enlive po BID, each supplement provides 350 kcal and 20 grams of protein -MVI with minerals daily -Magic cup TID with meals, each supplement provides 290 kcal and 9 grams of protein  NUTRITION DIAGNOSIS:   Inadequate oral intake related to decreased appetite as evidenced by meal completion < 50%.  GOAL:   Patient will meet greater than or equal to 90% of their needs  MONITOR:   Supplement acceptance,PO intake,Labs,Weight trends,Skin,I & O's  REASON FOR ASSESSMENT:   LOS    ASSESSMENT:   [name] is a 45 year old female with past medical history significant for HTN, heart failure (echo 04/2020 - 40-45%, grade II diastolic dysfunction), paroxysmal SVT, and uterine fibroids complicated by menometrorrhagia and chronic blood loss anemia who presented to [facility] on 01/10/21 for evaluation of weakness found to have SVT, acute on chronic heart failure, hypertensive emergency, AKI, COVID-19 infection, troponinemia, microcytic anemia and scleral icterus.  Pt admitted with CHF exacerbation.   Reviewed I/O's: -60 ml x 24 hours and -11.5 L since 01/11/21  UOP: 550 ml x 24 hours  Per MD notes, pt with bilateral hydronephrosis with displaced urinary bladder secondary to very large uterine fibroid. Gynecology does not recommend surgical intervention at this time.   Pt unavailable at time of visit.   Pt with erratic oral intake. Noted meal completion 5-75% (averaging less than 50% at meals).   Reviewed wt hx; pt has experienced a 22% wt loss over the past 9 months, which is significant for time frame.   Pt is at high risk for malnutrition, however, unable to identify at this time. Pt would benefit from addition of oral nutrition supplements.   Medications reviewed and include lasix.   Per psychiatry notes, pt will require behavioral health admission once medically stable.   Labs  reviewed: Na: 132.   Diet Order:   Diet Order            Diet Heart Room service appropriate? Yes; Fluid consistency: Thin; Fluid restriction: 1500 mL Fluid  Diet effective now                 EDUCATION NEEDS:   No education needs have been identified at this time  Skin:  Skin Assessment: Reviewed RN Assessment  Last BM:  01/24/21  Height:   Ht Readings from Last 1 Encounters:  01/10/21 5\' 6"  (1.676 m)    Weight:   Wt Readings from Last 1 Encounters:  01/25/21 56.7 kg    Ideal Body Weight:  59.1 kg  BMI:  Body mass index is 20.18 kg/m.  Estimated Nutritional Needs:   Kcal:  1800-2000  Protein:  100-115 grams  Fluid:  1.5 L    Loistine Chance, RD, LDN, [place] Registered Dietitian II Certified Diabetes Care and Education Specialist Please refer to [facility] for RD and/or RD on-call/weekend/after hours pager

## 2021-01-25 NOTE — Progress Notes (Signed)
Occupational Therapy Treatment Patient Details Name: Holly Bradley MRN: [id number] DOB: Jun 08, 1976 Today's Date: 01/25/2021    History of present illness 45 y.o. female with a hx of chronic systolic and diastolic heart failure, SVT, chronic anemia secondary to uterine fibroids, and HTN who presented with worsening LE edema, weakness and dizziness found to be in SVT. Pt found to have covid but as of 1/31 is currently off precautions.   OT comments  Patient continues to progress towards her stated goals given current medical condition.  She has been recommended for an inpatient psych admission.  Goals reviewed with the patient and adjusted as appropriate.  She was continued for 2x/wk, but OT may look to drop her to 1x/wk to monitor and maintain independence.    Follow Up Recommendations  No OT follow up    Equipment Recommendations  None recommended by OT    Recommendations for Other Services      Precautions / Restrictions Precautions Precautions: Fall Precaution Comments: Watch HR and BP. Restrictions Weight Bearing Restrictions: No       Mobility Bed Mobility Overal bed mobility: Modified Independent Bed Mobility: Supine to Sit     Supine to sit: Modified independent (Device/Increase time)        Transfers Overall transfer level: Needs assistance   Transfers: Sit to/from Stand;Stand Pivot Transfers Sit to Stand: Supervision Stand pivot transfers: Supervision            Balance           Standing balance support: No upper extremity supported Standing balance-Leahy Scale: Fair Standing balance comment: will reach for objects in her environment.                           ADL either performed or assessed with clinical judgement   ADL Overall ADL's : Needs assistance/impaired     Grooming: Wash/dry hands;Wash/dry face;Oral care;Standing;Supervision/safety               Lower Body Dressing: Cueing for  sequencing;Supervision/safety;Sitting/lateral leans   Toilet Transfer: Ambulation;Supervision/safety   Toileting- Clothing Manipulation and Hygiene: Sit to/from stand;Supervision/safety         General ADL Comments: Patient's HR double checked with pulse ox - monitor had HR in 120's and pulse ox on finger 78 HR.  MD in room and checking radial pulse, suspects monitor may not be correct as radisl pulse matched pulse ox, not the monitor.                       Cognition Arousal/Alertness: Awake/alert Behavior During Therapy: Restless                             Safety/Judgement: Decreased awareness of safety;Decreased awareness of deficits     General Comments: Patient is young female and independent PLOF.  History of Paranoia and psychosis, active.  Unaware if safety concerns and decreased attention are baseline or worse with this hospitalization.                          Pertinent Vitals/ Pain       Pain Assessment: No/denies pain  Frequency  Min 2X/week        Progress Toward Goals  OT Goals(current goals can now be found in the care plan section)  Progress towards OT goals: Progressing toward goals  Acute Rehab OT Goals Patient Stated Goal: wondering when she can be released OT Goal Formulation: With patient Time For Goal Achievement: 02/08/21 Potential to Achieve Goals: Good ADL Goals Pt Will Perform Grooming: with set-up;sitting;standing Pt Will Perform Lower Body Bathing: with supervision;sit to/from stand Pt Will Perform Lower Body Dressing: with supervision;sit to/from stand Pt Will Transfer to Toilet: with supervision;ambulating;regular height toilet Pt Will Perform Toileting - Clothing Manipulation and hygiene: with supervision;sit to/from stand;with set-up  Plan Discharge plan remains appropriate;Frequency remains appropriate     Co-evaluation                 AM-PAC OT "6 Clicks" Daily Activity     Outcome Measure   Help from another person eating meals?: None Help from another person taking care of personal grooming?: None Help from another person toileting, which includes using toliet, bedpan, or urinal?: A Little Help from another person bathing (including washing, rinsing, drying)?: A Little Help from another person to put on and taking off regular upper body clothing?: None Help from another person to put on and taking off regular lower body clothing?: None 6 Click Score: 22    End of Session    OT Visit Diagnosis: Unsteadiness on feet (R26.81);Muscle weakness (generalized) (M62.81)   Activity Tolerance Patient tolerated treatment well   Patient Left in chair;with chair alarm set;with call bell/phone within reach   Nurse Communication          Time: 1610-9604 OT Time Calculation (min): 22 min  Charges: OT General Charges $OT Visit: 1 Visit OT Treatments $Self Care/Home Management : 8-22 mins  01/25/2021  Rich, OTR/L  Acute Rehabilitation Services  Office:  [place] 01/25/2021, 11:02 AM

## 2021-01-25 NOTE — Progress Notes (Signed)
Progress Note  [name] Date of Encounter: 01/25/2021  [facility] Cardiologist: Glenetta Hew, MD   Subjective   Feeling well.  She notes that her breathing is a little more labored.  Inpatient Medications    Scheduled Meds: . [START ON 01/29/2021] amiodarone  200 mg Oral Daily  . amiodarone  400 mg Oral BID  . ARIPiprazole  2 mg Oral QHS  . enoxaparin (LOVENOX) injection  40 mg Subcutaneous Q24H  . isosorbide-hydrALAZINE  2 tablet Oral TID  . metoprolol succinate  200 mg Oral Daily   Continuous Infusions:  PRN Meds: acetaminophen, mineral oil-hydrophilic petrolatum   Vital Signs    Vitals:   01/24/21 0800 01/24/21 1200 01/24/21 1920 01/25/21 0538  BP: 112/68 105/65 129/77 119/85  Pulse:  99 85 84  Resp: 20 19 18 20   Temp:   (!) 97.2 F (36.2 C) 98 F (36.7 C)  TempSrc:   Oral Oral  SpO2: 97% 95% 97% 98%  Weight:    56.7 kg  Height:        Intake/Output Summary (Last 24 hours) at 01/25/2021 0841 Last data filed at 01/25/2021 0600 Gross per 24 hour  Intake 490 ml  Output 550 ml  Net -60 ml   Last 3 Weights 01/25/2021 01/24/2021 01/23/2021  Weight (lbs) 125 lb 124 lb 3.2 oz 123 lb 14.4 oz  Weight (kg) 56.7 kg 56.337 kg 56.2 kg      Telemetry    Sinus rhythm.  No events - Personally Reviewed  ECG    01/21/21: SVT.  Rate 163 bpm.   - Personally Reviewed  Physical Exam   VS:  BP 119/85 (BP Location: Left Arm)   Pulse 84   Temp 98 F (36.7 C) (Oral)   Resp 20   Ht 5\' 6"  (1.676 m)   Wt 56.7 kg   SpO2 98%   BMI 20.18 kg/m  , BMI Body mass index is 20.18 kg/m. GENERAL: Frail.  No acute distress.  HEENT: Pupils equal round and reactive, fundi not visualized, oral mucosa unremarkable NECK:  No jugular venous distention, waveform within normal limits, carotid upstroke brisk and symmetric, no bruits LUNGS:  Clear to auscultation bilaterally HEART:  RRR.  PMI not displaced or sustained,S1 and S2 within normal limits, no S3, no S4, no  clicks, no rubs, no murmurs ABD: Distended, positive bowel sounds normal in frequency in pitch, no bruits, no rebound, no guarding, no midline pulsatile mass, no hepatomegaly, no splenomegaly EXT:  2 plus pulses throughout, no edema, no cyanosis no clubbing SKIN:  No rashes no nodules NEURO:  Cranial nerves II through XII grossly intact, motor grossly intact throughout PSYCH:  Cognitively intact, oriented to person place and time.  Pleasant affect.  Labs    High Sensitivity Troponin:   Recent Labs  Lab 01/10/21 1449 01/10/21 1600 01/10/21 1927  TROPONINIHS 802* 922* 888*      Chemistry Recent Labs  Lab 01/23/21 0646 01/24/21 0414 01/25/21 0250  NA 132* 131* 132*  K 4.1 4.0 4.1  CL 94* 92* 93*  CO2 25 25 28   GLUCOSE 98 118* 105*  BUN 35* 41* 38*  CREATININE 1.51* 1.75* 1.64*  CALCIUM 8.8* 9.0 9.1  GFRNONAA 43* 36* 39*  ANIONGAP 13 14 11      Hematology Recent Labs  Lab 01/21/21 0139 01/24/21 0414 01/25/21 0250  WBC 10.4 8.8 7.9  RBC 4.44 4.18 3.96  HGB 8.4* 7.8* 7.8*  HCT 29.6* 28.4* 26.5*  MCV 66.7* 67.9* 66.9*  MCH 18.9* 18.7* 19.7*  MCHC 28.4* 27.5* 29.4*  RDW 27.7* 28.5* 28.8*  PLT 213 314 408*    BNP Recent Labs  Lab 01/24/21 0414  BNP 696.8*     DDimer No results for input(s): DDIMER in the last 168 hours.   Radiology    US RENAL  Result Date: 01/24/2021 CLINICAL DATA:  Acute renal injury EXAM: RENAL / URINARY TRACT ULTRASOUND COMPLETE COMPARISON:  None. FINDINGS: Right Kidney: Renal measurements: 10 x 4 x 4.4 cm = volume: 90 mL. Echogenicity within normal limits. No mass or hydronephrosis visualized. Left Kidney: Renal measurements: 8.6 x 3.8 x 4.0 cm. = volume: 68 mL. Echogenicity within normal limits. No mass or hydronephrosis visualized. Bladder: Appears normal for degree of bladder distention. Other: None. IMPRESSION: No acute abnormality noted. Electronically Signed   By: Inez Catalina M.D.   On: 01/24/2021 23:19    Cardiac Studies   ECHO  01/11/2021 1. Left ventricular ejection fraction, by estimation, is 20 to 25%. The  left ventricle has severely decreased function. The left ventricle  demonstrates global hypokinesis. The left ventricular internal cavity size  was mildly to moderately dilated. There  is moderate concentric left ventricular hypertrophy. Left ventricular  diastolic parameters are consistent with Grade II diastolic dysfunction  (pseudonormalization). Elevated left ventricular end-diastolic pressure.  The average left ventricular global  longitudinal strain is -5.8 %. The global longitudinal strain is abnormal.  2. Right ventricular systolic function is moderately reduced. The right  ventricular size is normal. There is moderately elevated pulmonary artery  systolic pressure.  3. Left atrial size was mildly dilated.  4. Right atrial size was mildly dilated.  5. Moderate pericardial effusion. The pericardial effusion is  circumferential. There is no evidence of cardiac tamponade.  6. The mitral valve is normal in structure. Mild mitral valve  regurgitation. No evidence of mitral stenosis.  7. The aortic valve is tricuspid. Aortic valve regurgitation is not  visualized. No aortic stenosis is present.  8. The inferior vena cava is dilated in size with <50% respiratory  variability, suggesting right atrial pressure of 15 mmHg.   ECHO 05/02/2020  1. Left ventricular ejection fraction, by estimation, is 40 to 45%. The  left ventricle has mildly decreased function. The left ventricle  demonstrates global hypokinesis. There is mild concentric left ventricular  hypertrophy. Left ventricular diastolic  parameters are consistent with Grade II diastolic dysfunction  (pseudonormalization). Elevated left atrial pressure.  2. Right ventricular systolic function is low normal. The right  ventricular size is normal. There is moderately elevated pulmonary artery  systolic pressure.  3. Left atrial size was  mildly dilated.  4. Right atrial size was mildly dilated.  5. Moderate pericardial effusion. The pericardial effusion is  circumferential. There is no evidence of cardiac tamponade.  6. The mitral valve is normal in structure. No evidence of mitral valve  regurgitation.  7. Tricuspid valve regurgitation is mild to moderate.  8. The aortic valve is normal in structure. Aortic valve regurgitation is  not visualized.  9. The inferior vena cava is dilated in size with <50% respiratory  variability, suggesting right atrial pressure of 15 mmHg  Patient Profile     45 y.o. female with chronic systolic and diastolic heart failure admitted with acute on chronic systolic diastolic heart failure in the setting of recurrent SVT, COVID-19, anemia, and newly diagnosed pelvic mass with bilateral ureteral compression and hydronephrosis.  Assessment & Plan    #  Acute on chronic systolic and diastolic heart failure: # Hypertension: # Acute renal failure: LVEF 40-45%.   She appears to be euvolemic on exam.  However her BNP is elevated to 696.  This is down from over 4502 weeks ago.  Renal function is improving today and there was no evidence of ureteral obstruction on her ultrasound yesterday.  Therefore we will resume her home Lasix 40 mg p.o. daily.  She is discharged she will need a basic metabolic panel in a week.  Otherwise, continue to monitor daily while here.  Continue BiDil and metoprolol.  No ARB at this time given tenuous renal function.  BP well-controlled.     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  She has been offered ablation as an outpatient and did not show up.  Appreciate EP consultation 1/31.  Given her structural heart disease they recommended amiodarone.  Continue 400mg  bid x 7 days then 200 mg daily.  Check TFTs and LFTs in one month. PFTs as an outpatient.  Thyroid function stable on repeat.  Continue metoprolol.  # Pericardial effusion:  Unchanged from 04/2020.  No  tamponode clinically.  # Iron deficiency anemia:  Thought to be to metomenorrhagia vs GI.  # Pelvic mass:  Outpatient w/u pending.    # Vascular dementia/paranoid ideation:  Discharge to inpatient rehab.  [facility] will sign off.   Medication Recommendations:  Resume home lasix Other recommendations (labs, testing, etc):  BMP in a week.  Follow up as an outpatient:  We will arrange       For questions or updates, please contact [place] Please consult [email or web address] for contact info under        Signed, Skeet Latch, MD  01/25/2021, 8:41 AM

## 2021-01-25 NOTE — Progress Notes (Signed)
Heart Failure Stewardship Pharmacist Progress Note   PCP: Patient, No Pcp Per PCP-Cardiologist: Freada Bergeron, MD    HPI:  45 yo F with PMH of chronic systolic and diastolic HF, HTN, uterine fibroids, and history of SVT. She presented to the ED for evaluation of weakness, and was found to have SVT, acute on chronic heart failure, hypertensive emergency, AKI, and a COVID-19 infection. An ECHO was done on 01/11/21 and LVEF severely decreased to 20-25% (down from 40-45% in May 2021). Has had recurrent SVT throughout this admission. Not deemed to be a great candidate for ablation given her comorbidities by cardiology. EP has been consulted and recommend medial management with amiodarone.   Current HF Medications: Metoprolol XL 200 mg daily  BiDil 20/37.5 mg 2 tabs TID  Prior to admission HF Medications: None - not taking any medications  Pertinent Lab Values: . Serum creatinine 1.64 (improved from 1.75), BUN 38, Potassium 4.1, Sodium 132, BNP >4500, Magnesium 2.0  Vital Signs: . Weight: 125 lbs (admission weight: 142 lbs) . Blood pressure: 110-120/70s  . Heart rate: 80-90s  Medication Assistance / Insurance Benefits Check: Does the patient have prescription insurance?  Yes Type of insurance plan: Medicaid (out-of-state)   Outpatient Pharmacy:  Prior to admission outpatient pharmacy: [facility] Is the patient willing to use [facility] at discharge? Yes   Assessment: 1. Acute on chronic systolic CHF (EF 69-67%), likely secondary to medication noncompliance, SVT, and COVID-19 infection. NYHA class III symptoms. - Resuming home diuretic of lasix 40 mg PO daily  - Continue metoprolol XL 200 mg daily - Losartan stopped on 1/30 due to worsening renal function; continue to hold until AKI resolves  - Spironolactone stopped on 1/30 due to worsening renal function. If able to resume, would only resume at 25 mg daily instead of 50 mg daily - Continue BiDil 20/27.5 mg 2 tabs TID    Plan: 1) Medication changes recommended at this time: - Continue current therapy; as AKI resolves, can consider resuming losartan   2) Patient assistance application(s): - None pending  3)  Education  - To be completed prior to discharge - Appreciate assistance from HF [place], PharmD PGY-1 Community Pharmacy Resident  01/25/2021 1:20 PM   Kerby Nora, PharmD, BCPS Heart Failure Stewardship Pharmacist Phone [phone]

## 2021-01-26 LAB — RENAL FUNCTION PANEL
Albumin: 2.5 g/dL — ABNORMAL LOW (ref 3.5–5.0)
Anion gap: 14 (ref 5–15)
BUN: 42 mg/dL — ABNORMAL HIGH (ref 6–20)
CO2: 26 mmol/L (ref 22–32)
Calcium: 9.4 mg/dL (ref 8.9–10.3)
Chloride: 93 mmol/L — ABNORMAL LOW (ref 98–111)
Creatinine, Ser: 1.78 mg/dL — ABNORMAL HIGH (ref 0.44–1.00)
GFR, Estimated: 35 mL/min — ABNORMAL LOW (ref >60)
Glucose, Bld: 130 mg/dL — ABNORMAL HIGH (ref 70–99)
Phosphorus: 3.6 mg/dL (ref 2.5–4.6)
Potassium: 4.1 mmol/L (ref 3.5–5.1)
Sodium: 133 mmol/L — ABNORMAL LOW (ref 135–145)

## 2021-01-26 LAB — CBC
HCT: 28.1 % — ABNORMAL LOW (ref 36.0–46.0)
Hemoglobin: 8.3 g/dL — ABNORMAL LOW (ref 12.0–15.0)
MCH: 20 pg — ABNORMAL LOW (ref 26.0–34.0)
MCHC: 29.5 g/dL — ABNORMAL LOW (ref 30.0–36.0)
MCV: 67.7 fL — ABNORMAL LOW (ref 80.0–100.0)
Platelets: 515 10*3/uL — ABNORMAL HIGH (ref 150–400)
RBC: 4.15 MIL/uL (ref 3.87–5.11)
RDW: 29.9 % — ABNORMAL HIGH (ref 11.5–15.5)
WBC: 9.3 10*3/uL (ref 4.0–10.5)
nRBC: 0 % (ref 0.0–0.2)

## 2021-01-26 MED ORDER — FUROSEMIDE 20 MG PO TABS
20.0000 mg | ORAL_TABLET | ORAL | Status: DC
  Administered 2021-01-26: 20 mg via ORAL
  Filled 2021-01-26: qty 1

## 2021-01-26 NOTE — Progress Notes (Signed)
Progress Note  [name] Date of Encounter: 01/26/2021  [facility] Cardiologist: Freada Bergeron, MD   Subjective   Feeling well.  She denies any shortness of breath.  She reports that her episodes of shortness of breath are more triggered by her motion and movement.  She has no exertional dyspnea.  She denies orthopnea or PND.  Inpatient Medications    Scheduled Meds: . [START ON 01/29/2021] amiodarone  200 mg Oral Daily  . amiodarone  400 mg Oral BID  . ARIPiprazole  2 mg Oral QHS  . enoxaparin (LOVENOX) injection  40 mg Subcutaneous Q24H  . feeding supplement  237 mL Oral BID BM  . furosemide  40 mg Oral Daily  . isosorbide-hydrALAZINE  2 tablet Oral TID  . metoprolol succinate  200 mg Oral Daily  . multivitamin with minerals  1 tablet Oral Daily   Continuous Infusions:  PRN Meds: acetaminophen, mineral oil-hydrophilic petrolatum   Vital Signs    Vitals:   01/25/21 1000 01/25/21 2106 01/26/21 0000 01/26/21 0400  BP: 127/79 124/72 119/79 139/87  Pulse:  85  85  Resp: 18 15 20 13   Temp:  98 F (36.7 C)  97.8 F (36.6 C)  TempSrc:  Oral  Tympanic  SpO2: 98% 97%  98%  Weight:    56.2 kg  Height:        Intake/Output Summary (Last 24 hours) at 01/26/2021 0846 Last data filed at 01/26/2021 0405 Gross per 24 hour  Intake 480 ml  Output 800 ml  Net -320 ml   Last 3 Weights 01/26/2021 01/25/2021 01/24/2021  Weight (lbs) 124 lb 125 lb 124 lb 3.2 oz  Weight (kg) 56.246 kg 56.7 kg 56.337 kg      Telemetry    Sinus rhythm.  4 beats NSVT- Personally Reviewed  ECG    01/21/21: SVT.  Rate 163 bpm.   - Personally Reviewed  Physical Exam   VS:  BP 139/87 (BP Location: Left Arm)   Pulse 85   Temp 97.8 F (36.6 C) (Tympanic)   Resp 13   Ht 5\' 6"  (1.676 m)   Wt 56.2 kg Comment: scale a  SpO2 98%   BMI 20.01 kg/m  , BMI Body mass index is 20.01 kg/m. GENERAL: Frail.  No acute distress.  HEENT: Pupils equal round and reactive, fundi not  visualized, oral mucosa unremarkable NECK:  No jugular venous distention, waveform within normal limits, carotid upstroke brisk and symmetric, no bruits LUNGS:  Clear to auscultation bilaterally HEART:  RRR.  PMI not displaced or sustained,S1 and S2 within normal limits, no S3, no S4, no clicks, no rubs, no murmurs ABD: Distended, positive bowel sounds normal in frequency in pitch, no bruits, no rebound, no guarding, no midline pulsatile mass, no hepatomegaly, no splenomegaly EXT:  2 plus pulses throughout, no edema, no cyanosis no clubbing SKIN:  No rashes no nodules NEURO:  Cranial nerves II through XII grossly intact, motor grossly intact throughout PSYCH:  Cognitively intact, oriented to person place and time.  Pleasant affect.  Labs    High Sensitivity Troponin:   Recent Labs  Lab 01/10/21 1449 01/10/21 1600 01/10/21 1927  TROPONINIHS 802* 922* 888*      Chemistry Recent Labs  Lab 01/24/21 0414 01/25/21 0250 01/26/21 0439  NA 131* 132* 133*  K 4.0 4.1 4.1  CL 92* 93* 93*  CO2 25 28 26   GLUCOSE 118* 105* 130*  BUN 41* 38* 42*  CREATININE  1.75* 1.64* 1.78*  CALCIUM 9.0 9.1 9.4  ALBUMIN  --   --  2.5*  GFRNONAA 36* 39* 35*  ANIONGAP 14 11 14      Hematology Recent Labs  Lab 01/24/21 0414 01/25/21 0250 01/26/21 0439  WBC 8.8 7.9 9.3  RBC 4.18 3.96 4.15  HGB 7.8* 7.8* 8.3*  HCT 28.4* 26.5* 28.1*  MCV 67.9* 66.9* 67.7*  MCH 18.7* 19.7* 20.0*  MCHC 27.5* 29.4* 29.5*  RDW 28.5* 28.8* 29.9*  PLT 314 408* 515*    BNP Recent Labs  Lab 01/24/21 0414  BNP 696.8*     DDimer No results for input(s): DDIMER in the last 168 hours.   Radiology    US RENAL  Result Date: 01/24/2021 CLINICAL DATA:  Acute renal injury EXAM: RENAL / URINARY TRACT ULTRASOUND COMPLETE COMPARISON:  None. FINDINGS: Right Kidney: Renal measurements: 10 x 4 x 4.4 cm = volume: 90 mL. Echogenicity within normal limits. No mass or hydronephrosis visualized. Left Kidney: Renal measurements:  8.6 x 3.8 x 4.0 cm. = volume: 68 mL. Echogenicity within normal limits. No mass or hydronephrosis visualized. Bladder: Appears normal for degree of bladder distention. Other: None. IMPRESSION: No acute abnormality noted. Electronically Signed   By: Inez Catalina M.D.   On: 01/24/2021 23:19    Cardiac Studies   ECHO 01/11/2021 1. Left ventricular ejection fraction, by estimation, is 20 to 25%. The  left ventricle has severely decreased function. The left ventricle  demonstrates global hypokinesis. The left ventricular internal cavity size  was mildly to moderately dilated. There  is moderate concentric left ventricular hypertrophy. Left ventricular  diastolic parameters are consistent with Grade II diastolic dysfunction  (pseudonormalization). Elevated left ventricular end-diastolic pressure.  The average left ventricular global  longitudinal strain is -5.8 %. The global longitudinal strain is abnormal.  2. Right ventricular systolic function is moderately reduced. The right  ventricular size is normal. There is moderately elevated pulmonary artery  systolic pressure.  3. Left atrial size was mildly dilated.  4. Right atrial size was mildly dilated.  5. Moderate pericardial effusion. The pericardial effusion is  circumferential. There is no evidence of cardiac tamponade.  6. The mitral valve is normal in structure. Mild mitral valve  regurgitation. No evidence of mitral stenosis.  7. The aortic valve is tricuspid. Aortic valve regurgitation is not  visualized. No aortic stenosis is present.  8. The inferior vena cava is dilated in size with <50% respiratory  variability, suggesting right atrial pressure of 15 mmHg.   ECHO 05/02/2020  1. Left ventricular ejection fraction, by estimation, is 40 to 45%. The  left ventricle has mildly decreased function. The left ventricle  demonstrates global hypokinesis. There is mild concentric left ventricular  hypertrophy. Left ventricular  diastolic  parameters are consistent with Grade II diastolic dysfunction  (pseudonormalization). Elevated left atrial pressure.  2. Right ventricular systolic function is low normal. The right  ventricular size is normal. There is moderately elevated pulmonary artery  systolic pressure.  3. Left atrial size was mildly dilated.  4. Right atrial size was mildly dilated.  5. Moderate pericardial effusion. The pericardial effusion is  circumferential. There is no evidence of cardiac tamponade.  6. The mitral valve is normal in structure. No evidence of mitral valve  regurgitation.  7. Tricuspid valve regurgitation is mild to moderate.  8. The aortic valve is normal in structure. Aortic valve regurgitation is  not visualized.  9. The inferior vena cava is dilated in size with <  50% respiratory  variability, suggesting right atrial pressure of 15 mmHg  Patient Profile     45 y.o. female with chronic systolic and diastolic heart failure admitted with acute on chronic systolic diastolic heart failure in the setting of recurrent SVT, COVID-19, anemia, and newly diagnosed pelvic mass with bilateral ureteral compression and hydronephrosis.  Assessment & Plan    # Acute on chronic systolic and diastolic heart failure: # Hypertension: # Acute renal failure: LVEF 40-45%.   She appears to be euvolemic on exam.  Yesterday she reported some shortness of breath.  BNP was elevated to 696.  Resumed her oral Lasix and renal function is slightly worse today.  She reports that her shortness of breath is more anxiety related.  We will stop her Lasix.  Given that she is going to inpatient psychiatry, I suspect it may be a difficult time titrating her Lasix dose according to weight her symptoms.  We will plan to start 20 mg p.o. q. Monday Wednesday and Friday.  Check a basic metabolic panel in a week.  Blood pressures well have been controlled.  Continue BiDil and metoprolol.  No ARB at this time given tenuous renal  function.   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  She has been offered ablation as an outpatient and did not show up.  Appreciate EP consultation 1/31.  Given her structural heart disease they recommended amiodarone.  Continue 400mg  bid x 7 days then 200 mg daily.  Check TFTs and LFTs in one month. PFTs as an outpatient.  Thyroid function stable on repeat.  Continue metoprolol.  # Pericardial effusion:  Unchanged from 04/2020.  No tamponode clinically.  # Iron deficiency anemia:  Thought to be to metomenorrhagia vs GI.  # Pelvic mass:  Outpatient w/u pending.    # Vascular dementia/paranoid ideation:  Discharge to inpatient rehab.  [facility] will sign off.   Medication Recommendations:  Resume home lasix Other recommendations (labs, testing, etc):  BMP in a week.  Follow up as an outpatient:  We will arrange       For questions or updates, please contact [place] Please consult [email or web address] for contact info under        Signed, Skeet Latch, MD  01/26/2021, 8:46 AM

## 2021-01-26 NOTE — Progress Notes (Signed)
Subjective: HD#16   No acute events overnight.  During evaluation at bedside this morning, patient states she is "doing fine." Feels "better overall" compared to yesterday. Denies chest pain or palpitations. When told she is medically stable for discharge to an inpatient psychiatric facility, she states she does not understand why an inpatient stay is necessary. She denies auditory or visual hallucinations, SI, or HI. States her mood is "in a good place."   Objective:  Vital signs in last 24 hours: Vitals:   01/25/21 1000 01/25/21 2106 01/26/21 0000 01/26/21 0400  BP: 127/79 124/72 119/79 139/87  Pulse:  85  85  Resp: 18 15 20 13   Temp:  98 F (36.7 C)  97.8 F (36.6 C)  TempSrc:  Oral  Tympanic  SpO2: 98% 97%  98%  Weight:    56.2 kg  Height:       Const: In no apparent distress, sitting on the edge of bed, conversational Resp: CTA BL, no wheezes, crackles, rhonchi CV: RRR, no m/r/g; no lower extremity edema Psych: appears frustrated, denies hallucinations, SI, or HI   Assessment/Plan:  Principal Problem:   Acute exacerbation of CHF (congestive heart failure) (HCC) Active Problems:   CHF (congestive heart failure) (HCC)   Pneumonia due to COVID-19 virus   Acute renal failure (HCC)   Anemia   Pelvic mass in female   Hypertensive emergency   Fibroid   Menorrhagia   Bilateral leg edema   Delirium   Fever in adult   Psychosis [facility])  [name] is a 45 year old female with past medical history significant for HTN, combined systolic and diastolic heart failure, paroxysmal SVT, and massive uterine fibroids complicated by menometrorrhagia and chronic blood loss anemia who presented to [facility] on 01/10/21 for evaluation of weaknessfound to be in SVT with acute on chronic biventricular heart failure,hypertensive emergency,AKI, COVID-19 infection and 23cm uterine mass filling peritoneal cavity with compression of venous structures and ureters with hospital course  complicated by ongoing paranoia and psychosis.  This is hospital day 16.  Biventricular heart failure, chronic (EF 20 to 25%) Moderate pericardial effusion, chronic Hypertension, chronic Weight this morning 124 lb from 125 lb yesterday. It is reassuring that her weight is stable and she has no signs of volume overload. Oral Lasix resumed yesterday and sCr increased slightly from 1.64->1.78. -Cardiology following, appreciate recommendations - Discontinue daily Lasix - Start 20 mg PO Lasix MWF - Continue isosorbide-hydralazine 20-37.5mg  three times daily - Continue metoprolol 200mg  XL daily  - Continue to hold ARB and spiro given renal function - Strict intake/output - Daily weights  AVNRT,recurrent  Non-sustained ventricular tachycardia, recurrent Stable.   -EP and Cardiology consulted, appreciate recommendations             - Amiodarone 400 mg BID for 7 days then 200 mg daily (start 01/23/20)                          - TFTs and LFTs in one month                          -  Will trend thyroid levels                         - TSH: 1.95                         -  T3: 93                         - T4: 1.18  -Continue metoprolol 200mg  XL daily  Paranoia and psychosis, active Psychiatry evaluated on 01/17/21 and recommended initiation of antipsychotic and discharge to inpatient psychiatry when medically cleared. Patient is medically stable for discharge at this time. Given 9 days have elapsed since last psych evaluation, have asked psych to reevaluate the patients needs. - Pysch consulted for reassessment - Continue aripiprazole 2mg  daily   AKI As above sCr increased slightly from 1.64->1.78 after resumption of home Lasix.  - Discontinue scheduled home Lasix 40mg  p.o - Start 20 mg PO Lasix MWF - Continuing to hold nephrotoxic agents  Large uterine fibroids, chronic Iron deficiency anemia2/2 menometrorrhagia, chronic Patient noted to have 23cm pelvic mass on CT Abdomen/Pelvis  stable from MRI in 2019. MRI from 2019 revealed compression of right common iliac artery & vein, likely compression of IVC and left common iliac vein as well, and bladder. - Consulted GYN Onc who plans to follow-up patient in outpatient setting - GYN consulted and administered Depo provera on 01/12/21 - GYN recommended calling when period starts for consideration of starting PO medications - Daily CBC  Vascular dementia, active. -Optimize medical conditions as listed above   VTE SAY:[id number] 40mg  daily TFT:DDUK Diet:Heart healthy Code status:Full code PT/OT recs:PT has signed off. No OT follow up, no equipment recommendations.   Alexandria Lodge, MD 01/26/2021, 5:51 AM Pager: [phone] Internal Medicine Teaching Service After 5pm on weekdays and 1pm on weekends: On Call pager: [phone]

## 2021-01-26 NOTE — Progress Notes (Signed)
Heart Failure Stewardship Pharmacist Progress Note   PCP: Patient, No Pcp Per PCP-Cardiologist: Freada Bergeron, MD    HPI:  45 yo F with PMH of chronic systolic and diastolic HF, HTN, uterine fibroids, and history of SVT. She presented to the ED for evaluation of weakness, and was found to have SVT, acute on chronic heart failure, hypertensive emergency, AKI, and a COVID-19 infection. An ECHO was done on 01/11/21 and LVEF severely decreased to 20-25% (down from 40-45% in May 2021). Has had recurrent SVT throughout this admission. Not deemed to be a great candidate for ablation given her comorbidities by cardiology. EP has been consulted and recommend medial management with amiodarone.   Current HF Medications: Furosemide 20 mg every Mon-Wed-Fri Metoprolol XL 200 mg daily  BiDil 20/37.5 mg 2 tabs TID  Prior to admission HF Medications: None  Pertinent Lab Values: . Serum creatinine 1.78, BUN 42, Potassium 4.1, Sodium 133, BNP >4500  Vital Signs: . Weight: 124 lbs (admission weight: 142 lbs) . Blood pressure: 130/80s . Heart rate: 80-90s  Medication Assistance / Insurance Benefits Check: Does the patient have prescription insurance?  Yes Type of insurance plan: Medicaid (out-of-state)  Outpatient Pharmacy:  Prior to admission outpatient pharmacy: [facility] Is the patient willing to use [facility] at discharge? Yes   Assessment: 1. Acute on chronic systolic CHF (EF 69-67%), likely secondary to medication noncompliance, SVT, and COVID-19 infection. NYHA class II symptoms. - Agree with changing furosemide to 20 mg every Mon/Wed/Fri - Continue metoprolol XL 200 mg daily - Losartan stopped on 1/30 due to worsening renal function; continue to hold until AKI resolves  - Spironolactone stopped on 1/30 due to worsening renal function. If able to resume, would only resume at 25 mg daily instead of 50 mg daily - Continue BiDil 20/27.5 mg 2 tabs TID   Plan: 1) Medication changes  recommended at this time: - Continue current therapy; as AKI resolves, can consider resuming losartan   2) Patient assistance application(s): - None pending  3)  Education  - To be completed prior to discharge - Appreciate assistance from HF [place], PharmD, BCPS Heart Failure Cytogeneticist Phone [phone]

## 2021-01-26 NOTE — Progress Notes (Signed)
Nutrition Follow-up  DOCUMENTATION CODES:   Not applicable  INTERVENTION:   -Continue Ensure Enlive po BID, each supplement provides 350 kcal and 20 grams of protein -Continue MVI with minerals daily -Continue Magic cup TID with meals, each supplement provides 290 kcal and 9 grams of protein  NUTRITION DIAGNOSIS:   Inadequate oral intake related to decreased appetite as evidenced by meal completion < 50%.  Ongoing  GOAL:   Patient will meet greater than or equal to 90% of their needs  Progressing   MONITOR:   Supplement acceptance,PO intake,Labs,Weight trends,Skin,I & O's  REASON FOR ASSESSMENT:   LOS    ASSESSMENT:   Holly Bradley is a 45 year old female with past medical history significant for HTN, heart failure (echo 04/2020 - 40-45%, grade II diastolic dysfunction), paroxysmal SVT, and uterine fibroids complicated by menometrorrhagia and chronic blood loss anemia who presented to [facility] on 01/10/21 for evaluation of weakness found to have SVT, acute on chronic heart failure, hypertensive emergency, AKI, COVID-19 infection, troponinemia, microcytic anemia and scleral icterus.  Reviewed I/O's: -320 ml x 24 hours and -10.1 L since 01/12/21  UOP: 800 ml x 24 hours  Per MD notes, pt with bilateral hydronephrosis with displaced urinary bladder secondary to very large uterine fibroid. Gynecology does not recommend surgical intervention at this time.   Spoke with pt at bedside, who reports variable intake since hospitalization. She shares that she often experiences early satiety secondary to abdominal distention from uterine fibroids. Additionally, she is becoming tired of some of the meal options secondary to LOS.   PTA, pt reports good appetite. She consumed 3 meals per day (Breakfast: eggs, bacon, and toast: Lunch and Dinner: meat, starch, and vegetable).   Reviewed wt hx; pt has experienced a 22% wt loss over the past 9 months, which is significant for time frame.  Pt endorses wt loss, which she partially attributes to diuresis.  Discussed importance of good meal and supplement intake to promote healing. Pt likes Ensure Enlive supplements and is willing to continue when (pt consumed about 50% of Ensure supplement this morning).   Per MD notes, pt is medically stable to discharge to an inpatient psychiatric facility.   Labs reviewed: Na: 133.   NUTRITION - FOCUSED PHYSICAL EXAM:  Flowsheet Row Most Recent Value  Orbital Region No depletion  Upper Arm Region Mild depletion  Thoracic and Lumbar Region No depletion  Buccal Region No depletion  Temple Region No depletion  Clavicle Bone Region Mild depletion  Clavicle and Acromion Bone Region Mild depletion  Scapular Bone Region Mild depletion  Dorsal Hand No depletion  Patellar Region Mild depletion  Anterior Thigh Region Mild depletion  Posterior Calf Region Mild depletion  Edema (RD Assessment) Mild  Hair Reviewed  Eyes Reviewed  Mouth Reviewed  Skin Reviewed  Nails Reviewed       Diet Order:   Diet Order            Diet Heart Room service appropriate? Yes; Fluid consistency: Thin; Fluid restriction: 1500 mL Fluid  Diet effective now                 EDUCATION NEEDS:   No education needs have been identified at this time  Skin:  Skin Assessment: Reviewed RN Assessment  Last BM:  01/24/21  Height:   Ht Readings from Last 1 Encounters:  01/10/21 5\' 6"  (1.676 m)    Weight:   Wt Readings from Last 1 Encounters:  01/26/21 56.2 kg  Ideal Body Weight:  59.1 kg  BMI:  Body mass index is 20.01 kg/m.  Estimated Nutritional Needs:   Kcal:  1800-2000  Protein:  100-115 grams  Fluid:  1.5 L    Loistine Chance, RD, LDN, [place] Registered Dietitian II Certified Diabetes Care and Education Specialist Please refer to [facility] for RD and/or RD on-call/weekend/after hours pager

## 2021-01-27 DIAGNOSIS — F28 Other psychotic disorder not due to a substance or known physiological condition: Secondary | ICD-10-CM

## 2021-01-27 LAB — RENAL FUNCTION PANEL
Albumin: 2.4 g/dL — ABNORMAL LOW (ref 3.5–5.0)
Anion gap: 14 (ref 5–15)
BUN: 41 mg/dL — ABNORMAL HIGH (ref 6–20)
CO2: 25 mmol/L (ref 22–32)
Calcium: 9.1 mg/dL (ref 8.9–10.3)
Chloride: 94 mmol/L — ABNORMAL LOW (ref 98–111)
Creatinine, Ser: 1.69 mg/dL — ABNORMAL HIGH (ref 0.44–1.00)
GFR, Estimated: 37 mL/min — ABNORMAL LOW (ref >60)
Glucose, Bld: 98 mg/dL (ref 70–99)
Phosphorus: 3.7 mg/dL (ref 2.5–4.6)
Potassium: 4.1 mmol/L (ref 3.5–5.1)
Sodium: 133 mmol/L — ABNORMAL LOW (ref 135–145)

## 2021-01-27 LAB — CBC
HCT: 27.3 % — ABNORMAL LOW (ref 36.0–46.0)
Hemoglobin: 7.6 g/dL — ABNORMAL LOW (ref 12.0–15.0)
MCH: 19 pg — ABNORMAL LOW (ref 26.0–34.0)
MCHC: 27.8 g/dL — ABNORMAL LOW (ref 30.0–36.0)
MCV: 68.1 fL — ABNORMAL LOW (ref 80.0–100.0)
Platelets: 582 10*3/uL — ABNORMAL HIGH (ref 150–400)
RBC: 4.01 MIL/uL (ref 3.87–5.11)
RDW: 29.8 % — ABNORMAL HIGH (ref 11.5–15.5)
WBC: 9.2 10*3/uL (ref 4.0–10.5)
nRBC: 0 % (ref 0.0–0.2)

## 2021-01-27 LAB — CULTURE, BLOOD (ROUTINE X 2)
Culture: NO GROWTH
Culture: NO GROWTH
Special Requests: ADEQUATE

## 2021-01-27 MED ORDER — FUROSEMIDE 20 MG PO TABS: 20.0000 mg | ORAL_TABLET | ORAL | 0 refills | Status: DC

## 2021-01-27 MED ORDER — ARIPIPRAZOLE 2 MG PO TABS
2.0000 mg | ORAL_TABLET | Freq: Every day | ORAL | 0 refills | Status: DC
Start: 2021-01-27 — End: 2021-02-02

## 2021-01-27 MED ORDER — METOPROLOL SUCCINATE ER 200 MG PO TB24
200.0000 mg | ORAL_TABLET | Freq: Every day | ORAL | 0 refills | Status: DC
Start: 2021-01-28 — End: 2021-01-30

## 2021-01-27 MED ORDER — AMIODARONE HCL 200 MG PO TABS: 200.0000 mg | ORAL_TABLET | Freq: Every day | ORAL | 0 refills | Status: DC

## 2021-01-27 MED ORDER — ADULT MULTIVITAMIN W/MINERALS CH: 1.0000 | ORAL_TABLET | Freq: Every day | ORAL | 0 refills | Status: DC

## 2021-01-27 MED ORDER — ISOSORB DINITRATE-HYDRALAZINE 20-37.5 MG PO TABS: 2.0000 | ORAL_TABLET | Freq: Three times a day (TID) | ORAL | 0 refills | Status: DC

## 2021-01-27 NOTE — Consult Note (Addendum)
[facility] Consult   Reason for Consult:  "medically cleared, reevaluation for inpatient psych" Referring Physician:  Primary team Patient Identification: Holly Bradley MRN:  [id number] Principal Diagnosis: Acute exacerbation of CHF (congestive heart failure) ([place]) Diagnosis:  Principal Problem:   Acute exacerbation of CHF (congestive heart failure) ([place]) Active Problems:   CHF (congestive heart failure) ([place])   Pneumonia due to COVID-19 virus   Acute renal failure ([place])   Anemia   Pelvic mass in female   Hypertensive emergency   Fibroid   Menorrhagia   Bilateral leg edema   Delirium   Fever in adult   Psychosis ([place])   Total Time spent with patient: 20 minutes  Subjective:   Pt interviewed bedside; she is calm, cooperative and pleasant; she displays a linear TP. She is AAO x 4. She denies SI/HI/AVH. She denies psychiatric history to me and states that she is unsure why psychiatry was asked to see her. Per primary team's attending's attestation she has made reference to previous psychiatric hospitalizations. Explained that during hospitalization there was some concern for paranoia; pt verbalized understanding but states that she feels fine now. She denies feeling paranoid today; she denies IOR, denies TI/TW/TB. She inquires about her heart health; encouraged her to speak to primary team  HPI:    Holly Bradley is a 45 y.o. female patient with past medical history significant for HTN, combined systolic and diastolic heart failure, paroxysmal SVT, and massive uterine fibroids complicated by menometrorrhagia and chronic blood loss anemia who presented [facility] for evaluation of weaknessfound to be in SVT with acute on chronic biventricular heart failure,hypertensive emergency,AKI, COVID-19 infection and 23cm uterine mass filling peritoneal cavity with compression of venous structures and ureters with hospital course complicated by ongoing paranoia and  psychosis. Psychiatry consulted earlier in admission for paranoia; recommended abilify  Past Psychiatric History: see previous consult note  Risk to Self:   Risk to Others:   Prior Inpatient Therapy:   Prior Outpatient Therapy:    Past Medical History:  Past Medical History:  Diagnosis Date  . Chronic blood loss anemia    Related to uterine fibroids  . Essential hypertension    Poorly controlled, repeat by report only on nicardipine 90 mg  . Fibroid    Likely complicated by by menometrorrhagia and chronic anemia  . Paroxysmal SVT (supraventricular tachycardia) (HCC)    Frequent episodes, can last anywhere from 20 minutes to 12-15 hours.  Not on beta-blocker or calcium channel blocker    Past Surgical History:  Procedure Laterality Date  . Unknown     Family History:  Family History  Family history unknown: Yes   Family Psychiatric  History: see previous consult Social History:  Social History   Substance and Sexual Activity  Alcohol Use None     Social History   Substance and Sexual Activity  Drug Use Not on file    Social History   Socioeconomic History  . Marital status: Single    Spouse name: Not on file  . Number of children: Not on file  . Years of education: Not on file  . Highest education level: Not on file  Occupational History  . Not on file  Tobacco Use  . Smoking status: Not on file  . Smokeless tobacco: Not on file  Substance and Sexual Activity  . Alcohol use: Not on file  . Drug use: Not on file  . Sexual activity: Not on file  Other Topics Concern  .  Not on file  Social History Narrative  . Not on file   Social Determinants of Health   Financial Resource Strain: Not on file  Food Insecurity: Not on file  Transportation Needs: Not on file  Physical Activity: Not on file  Stress: Not on file  Social Connections: Not on file   Additional Social History:    Allergies:  No Known Allergies  Labs:  Results for orders placed or  performed during the hospital encounter of 01/10/21 (from the past 48 hour(s))  CBC     Status: Abnormal   Collection Time: 01/26/21  4:39 AM  Result Value Ref Range   WBC 9.3 4.0 - 10.5 K/uL   RBC 4.15 3.87 - 5.11 MIL/uL   Hemoglobin 8.3 (L) 12.0 - 15.0 g/dL    Comment: Reticulocyte Hemoglobin testing may be clinically indicated, consider ordering this additional test [id number]    HCT 28.1 (L) 36.0 - 46.0 %   MCV 67.7 (L) 80.0 - 100.0 fL   MCH 20.0 (L) 26.0 - 34.0 pg   MCHC 29.5 (L) 30.0 - 36.0 g/dL   RDW 29.9 (H) 11.5 - 15.5 %   Platelets 515 (H) 150 - 400 K/uL   nRBC 0.0 0.0 - 0.2 %    Comment: Performed at [facility], [place] [address] [place], [place] [address]  Renal function panel     Status: Abnormal   Collection Time: 01/26/21  4:39 AM  Result Value Ref Range   Sodium 133 (L) 135 - 145 mmol/L   Potassium 4.1 3.5 - 5.1 mmol/L   Chloride 93 (L) 98 - 111 mmol/L   CO2 26 22 - 32 mmol/L   Glucose, Bld 130 (H) 70 - 99 mg/dL    Comment: Glucose reference range applies only to samples taken after fasting for at least 8 hours.   BUN 42 (H) 6 - 20 mg/dL   Creatinine, Ser 1.78 (H) 0.44 - 1.00 mg/dL   Calcium 9.4 8.9 - 10.3 mg/dL   Phosphorus 3.6 2.5 - 4.6 mg/dL   Albumin 2.5 (L) 3.5 - 5.0 g/dL   GFR, Estimated 35 (L) >60 mL/min    Comment: (NOTE) Calculated using the CKD-EPI Creatinine Equation (2021)    Anion gap 14 5 - 15    Comment: Performed at [facility] [address], [place], [place] [address]  CBC     Status: Abnormal   Collection Time: 01/27/21  2:39 AM  Result Value Ref Range   WBC 9.2 4.0 - 10.5 K/uL   RBC 4.01 3.87 - 5.11 MIL/uL   Hemoglobin 7.6 (L) 12.0 - 15.0 g/dL    Comment: Reticulocyte Hemoglobin testing may be clinically indicated, consider ordering this additional test [id number]    HCT 27.3 (L) 36.0 - 46.0 %   MCV 68.1 (L) 80.0 - 100.0 fL   MCH 19.0 (L) 26.0 - 34.0 pg   MCHC 27.8 (L) 30.0 - 36.0 g/dL   RDW 29.8 (H) 11.5 - 15.5 %    Platelets 582 (H) 150 - 400 K/uL    Comment: REPEATED TO VERIFY   nRBC 0.0 0.0 - 0.2 %    Comment: Performed at [facility], [place] [address], [place], [place] [address]  Renal function panel     Status: Abnormal   Collection Time: 01/27/21  2:39 AM  Result Value Ref Range   Sodium 133 (L) 135 - 145 mmol/L   Potassium 4.1 3.5 - 5.1 mmol/L  Chloride 94 (L) 98 - 111 mmol/L   CO2 25 22 - 32 mmol/L   Glucose, Bld 98 70 - 99 mg/dL    Comment: Glucose reference range applies only to samples taken after fasting for at least 8 hours.   BUN 41 (H) 6 - 20 mg/dL   Creatinine, Ser 1.69 (H) 0.44 - 1.00 mg/dL   Calcium 9.1 8.9 - 10.3 mg/dL   Phosphorus 3.7 2.5 - 4.6 mg/dL   Albumin 2.4 (L) 3.5 - 5.0 g/dL   GFR, Estimated 37 (L) >60 mL/min    Comment: (NOTE) Calculated using the CKD-EPI Creatinine Equation (2021)    Anion gap 14 5 - 15    Comment: Performed at [facility] [address] [place], [place] [address]    Current Facility-Administered Medications  Medication Dose Route Frequency Provider Last Rate Last Admin  . acetaminophen (TYLENOL) tablet 650 mg  650 mg Oral Q4H PRN Seawell, Jaimie A, DO   650 mg at 01/22/21 1335  . [START ON 01/29/2021] amiodarone (PACERONE) tablet 200 mg  200 mg Oral Daily Skeet Latch, MD      . amiodarone (PACERONE) tablet 400 mg  400 mg Oral BID Velna Ochs, MD   400 mg at 01/27/21 0839  . ARIPiprazole (ABILIFY) tablet 2 mg  2 mg Oral QHS Suella Broad, FNP   2 mg at 01/26/21 2146  . enoxaparin (LOVENOX) injection 40 mg  40 mg Subcutaneous Q24H Seawell, Jaimie A, DO   40 mg at 01/26/21 1659  . feeding supplement (ENSURE ENLIVE / ENSURE PLUS) liquid 237 mL  237 mL Oral BID BM Velna Ochs, MD   237 mL at 01/27/21 0841  . furosemide (LASIX) tablet 20 mg  20 mg Oral Q M,W,F Skeet Latch, MD   20 mg at 01/26/21 0905  . isosorbide-hydrALAZINE (BIDIL) 20-37.5 MG per tablet 2 tablet  2 tablet Oral TID Freada Bergeron, MD    2 tablet at 01/27/21 0841  . metoprolol succinate (TOPROL-XL) 24 hr tablet 200 mg  200 mg Oral Daily Croitoru, Mihai, MD   200 mg at 01/27/21 0840  . mineral oil-hydrophilic petrolatum (AQUAPHOR) ointment   Topical Daily PRN Cato Mulligan, MD      . multivitamin with minerals tablet 1 tablet  1 tablet Oral Daily Velna Ochs, MD   1 tablet at 01/27/21 [id number]    Musculoskeletal: Strength & Muscle Tone: within normal limits Gait & Station: normal Patient leans: N/A  Psychiatric Specialty Exam: Physical Exam Constitutional:      Appearance: Normal appearance. She is normal weight.  HENT:     Head: Normocephalic and atraumatic.  Eyes:     Extraocular Movements: Extraocular movements intact.  Pulmonary:     Effort: Pulmonary effort is normal.  Neurological:     Mental Status: She is alert.     Review of Systems  Constitutional: Negative for activity change and appetite change.  Psychiatric/Behavioral: Negative for agitation, dysphoric mood, hallucinations and suicidal ideas.    Blood pressure 115/71, pulse 77, temperature (!) 97.1 F (36.2 C), temperature source Oral, resp. rate 16, height 5\' 6"  (1.676 m), weight 57 kg, SpO2 100 %.Body mass index is 20.28 kg/m.  General Appearance: Casual and Fairly Groomed  Eye Contact:  Good  Speech:  Clear and Coherent and Normal Rate  Volume:  Normal  Mood:  "ok"  Affect:  Appropriate and Congruent  Thought Process:  Coherent, Goal Directed and Linear  Orientation:  Full (Time,  Place, and Person)  Thought Content:  WDL  Suicidal Thoughts:  No  Homicidal Thoughts:  No  Memory:  Immediate;   Fair Recent;   Fair  Judgement:  Good  Insight:  Good  Psychomotor Activity:  wnl  Concentration:  Concentration: Good and Attention Span: Good  Recall:  Good  Fund of Knowledge:  Good  Language:  Good  Akathisia:  No  Handed: did not assess  AIMS (if indicated):     Assets:  Communication Skills Desire for Improvement Housing   ADL's:  Intact  Cognition:  WNL  Sleep:        Treatment Plan Summary: patient does not meet inpatient criteria May continue abilify on outpatient basis; recommended to primary team to consult SW regarding outpatient options Psychiatry to sign off  Recommendations provided to primary team via secure chat  Disposition: per primary team  Ival Bible, MD 01/27/2021 12:19 PM

## 2021-01-27 NOTE — Progress Notes (Signed)
NAME:  Holly Bradley, MRN:  [id number], DOB:  25-Nov-1976, LOS: 17 ADMISSION DATE:  01/10/2021  Subjective  Patient evaluated at bedside this AM. She states she is doing pretty well, was able to sleep some last night. She feels as though she is close to being able to be discharged. Discussed pending psychiatry evaluation with Holly Bradley. She expressed she was hesitant but willing to discuss their recommendations once they are made.   Objective   Blood pressure 133/76, pulse 78, temperature (!) 97.5 F (36.4 C), temperature source Oral, resp. rate 16, height 5\' 6"  (1.676 m), weight 57 kg, SpO2 98 %.     Intake/Output Summary (Last 24 hours) at 01/27/2021 0512 Last data filed at 01/26/2021 1838 Gross per 24 hour  Intake 60 ml  Output 450 ml  Net -390 ml   Filed Weights   01/25/21 0538 01/26/21 0400 01/27/21 0330  Weight: 56.7 kg 56.2 kg 57 kg   Physical Exam: General: Pleasant, appears comfortable, no acute distress Extremities: No pitting edema bilaterally. Moving all four extremities appropriately.  Psych: Normal speech, affect   Labs    CBC Latest Ref Rng & Units 01/27/2021 01/26/2021 01/25/2021  WBC 4.0 - 10.5 K/uL 9.2 9.3 7.9  Hemoglobin 12.0 - 15.0 g/dL 7.6(L) 8.3(L) 7.8(L)  Hematocrit 36.0 - 46.0 % 27.3(L) 28.1(L) 26.5(L)  Platelets 150 - 400 K/uL 582(H) 515(H) 408(H)   BMP Latest Ref Rng & Units 01/27/2021 01/26/2021 01/25/2021  Glucose 70 - 99 mg/dL 98 130(H) 105(H)  BUN 6 - 20 mg/dL 41(H) 42(H) 38(H)  Creatinine 0.44 - 1.00 mg/dL 1.69(H) 1.78(H) 1.64(H)  Sodium 135 - 145 mmol/L 133(L) 133(L) 132(L)  Potassium 3.5 - 5.1 mmol/L 4.1 4.1 4.1  Chloride 98 - 111 mmol/L 94(L) 93(L) 93(L)  CO2 22 - 32 mmol/L 25 26 28   Calcium 8.9 - 10.3 mg/dL 9.1 9.4 9.1   Consults:  Psychiatry Cardiology (signed off) EP (signed off) Gynecology  Significant Diagnostic Tests:  TTE 1/20 > LVEF 20-25% with global hypokinesis. Moderate concentric left ventricular hypertrophy. Grade II diastolic  dysfunction. Moderately reduced RV systolic function. Moderately elevated pulmonary artery systolic pressure. LA and RA mildly dilated. Moderate pericardial effusion, no evidence of tamponade. Mild mitral regurgitation.  CT Abd/Pel 1/20 > Large 23cm mass, most likely uterine fibroid. Significant cardiomegaly with small to moderate pericardial effusion.  CT Head 1/24 > No evidence of acute intracranial abnormality. Nonspecific moderate periventricular and subcortical white matter hypodensity in cerebral white matter.  MR Brain 1/25 > Severe chronic small vessel disease with multiple small acute and subacute cerebral white matter infarcts, mostly in right hemisphere. Similar small acute right superior cerebellar lacunar infarct.  Renal ultrasound 2/2 > No acute abnormality noted.  Micro Data:  COVID-19 1/19 > positive UCX 1/21 > Insignificant growth BCx 1/19 > NGTD BCx 1/31 > NGTD  Summary  Holly Bradley is 45yo person with combined systolic and diastolic heart failure, paroxysmal SVT, hypertension, uterine fibroids with menometrorrhagia and chronic anemia 2/2 blood loss admitted 1/19 with acute on chronic biventricular heart failure, SVT, acute symptomatic hypertensive emergency, AKI, and COVID-19 infection with course complicated by paranoia and psychosis. Patient now normotensive and euvolemic on exam, hemodynamically stable, awaiting further evaluation by psychiatry for disposition assistance.  Assessment & Plan:  Principal Problem:   Acute exacerbation of CHF (congestive heart failure) ([facility]) Active Problems:   CHF (congestive heart failure) ([facility])   Pneumonia due to COVID-19 virus   Acute renal failure ([facility])   Anemia  Pelvic mass in female   Hypertensive emergency   Fibroid   Menorrhagia   Bilateral leg edema   Delirium   Fever in adult   Psychosis [facility])  #Biventricular heart failure #Moderate pericardial effusion Patient with appropriate urine output yesterday. Weight  slightly up 56kg>57kg from yesterday, although she continues to appear euvolemic on exam. Kidney function slightly improved from yesterday (sCr 1.69 < 1.78). Appreciate assistance from cardiology on this case. Will continue with current regimen as listed below. - Current medication regimen:  - PO Lasix 20mg  MWF  - Bidil 20-37.5mg  TID  - Toprol-XL 200mg  qd  - Holding ARB, spiro given kidney function. Can re-assess during outpatient visit. - Daily weights - Strict I&O's  #Recurrent AVNRT #Non-sustained ventricular tachycardia Patient has been doing well on current regimen. No signs or symptoms of further episodes in the last couple of days. Appreciate EP, cardiology with their assistance on this case. Patient will need to follow-up as outpatient to re-check thyroid and liver function tests in one month. - C/w amiodarone 200mg  qd  - Will need to re-check thyroid, liver tests in one month  #Acute kidney injury sCr 1.69 > 1.78. Continues to have appropriate urine output. Will have patient start oral lasix three times per week to decrease risk of further kidney injury. Will need close follow-up once discharged. - PO lasix 20mg  MWF - Avoid nephrotoxic meds  #Paranoia and psychosis Psych previously evaluated on 01/17/21 with recommendations of inpatient psychiatry admission once medically cleared. At this time, patient is medically cleared and psych has been asked to re-evaluate need for inpatient psychiatry admission. - Pending psychiatry re-assessment - C/w Abilify 2mg  qd  #Large uterine fibroids #IDA 2/2 menometrorrhagia Gynecology consulted earlier this admission. They plan to follow-up as outpatient at this time. On 1/21 patient was given Depo provera. If patient is still hospitalized once period starts, will call GYN for possible initiation of po medications. - Daily CBC - F/u w/ GYN-ONC as outpatient  Best practice:  DIET: HH IVF: n/a DVT PPX: Lovenox BOWEL: n/a CODE: FULL FAM COM:  n/a  Sanjuan Dame, MD Internal Medicine Resident PGY-1 PAGER: [phone] 01/27/2021 5:12 AM  If after hours (below), please contact on-call pager: [phone] 5PM-7AM Monday-Friday 1PM-7AM Saturday-Sunday

## 2021-01-27 NOTE — Social Work (Signed)
CSW provided patient with a list of OPT provider to follow up with in regards to counseling services.

## 2021-01-27 NOTE — Discharge Summary (Signed)
Name: Holly Bradley MRN: [id number] DOB: [birth year] 45 y.o. PCP: Patient, No Pcp Per  Date of Admission: 01/10/2021 10:20 AM Date of Discharge: 01/27/2021 Attending Physician: Axel Filler, *  Discharge Diagnosis: 1. Acute on chronic biventricular heart failure 2. Recurrent AVNRT 3. Acute kidney injury 4. Acute symptomatic hypertension 5. Large uterine fibroids 6. Iron deficiency anemia 2/2 menometrorrhagia 7. Paranoia, psychosis  Discharge Medications: Allergies as of 01/27/2021   No Known Allergies     Medication List    STOP taking these medications   carvedilol 6.25 MG tablet Commonly known as: COREG   lisinopril 20 MG tablet Commonly known as: ZESTRIL     TAKE these medications   amiodarone 200 MG tablet Commonly known as: PACERONE Take 1 tablet (200 mg total) by mouth daily.   ARIPiprazole 2 MG tablet Commonly known as: ABILIFY Take 1 tablet (2 mg total) by mouth at bedtime.   furosemide 20 MG tablet Commonly known as: LASIX Take 1 tablet (20 mg total) by mouth every Monday, Wednesday, and Friday for 12 doses. What changed:   medication strength  how much to take  when to take this   isosorbide-hydrALAZINE 20-37.5 MG tablet Commonly known as: BIDIL Take 2 tablets by mouth 3 (three) times daily.   metoprolol 200 MG 24 hr tablet Commonly known as: TOPROL-XL Take 1 tablet (200 mg total) by mouth daily. Take with or immediately following a meal.   multivitamin with minerals Tabs tablet Take 1 tablet by mouth daily.      Disposition and follow-up:   Holly Bradley was discharged from [facility] in Stable condition.  At the hospital follow up visit please address:  1. Acute on chronic HF: While hospitalized, had difficulty titrating lasix dose with kidney function, electrolyte abnormalities. Will need to check weight, BMP, and assess volume status at follow-up. In addition, patient's medication regimen is complex.  Encourage going over medications to ensure [name] understands when and how much of each medication she should be taking.  2. AVNRT: Patient's heart rate controlled with amiodarone. Patient will need to have thyroid and liver function tests repeated one month after discharge.   3. Acute kidney injury: Please re-check kidney function at follow-up. At discharge, sCr 1.69.  4. Acute symptomatic hypertension: BP elevated to systolic 222 in ED, as patient previously non-adherent to medications. Please check the medications patient is taking and that she understands importance of adherence.  5. Large uterine fibroids: Patient evaluated for large fibroids by GYN while hospitalized. No acute measures were needed, but patient will need to follow-up with gynecology for further evaluation and treatment.  6. IDA 2/2 menometrorrhagia: GYN administered 01/12/21 for control of periods d/t blood loss. Patient will need to have CBC checked at follow-up and follow-up with gynecology as well.  7. Paranoia, psychosis: Patient initially was to be admitted for inpatient psychiatry evaluation. However, given the complex nature of her medical conditions, she improved with Abilify.    8.  Labs / imaging needed at time of follow-up: CBC, BMP  9.  Pending labs/ test needing follow-up: n/a  Follow-up Appointments:  Follow-up Information    [facility]. Go on 02/12/2021.   Why: @2 :30pm Contact information: [place] [address] [phone]       Freada Bergeron, MD Follow up.   Specialties: Cardiology, Radiology Why: 02/09/21 @ 9:00AM Contact information: [address] [place] [address] [phone]  [facility] Follow up.   Why: The clinic will call you on Monday to set an appointment up. We will need to see you in the clinic later next week to check your blood levels. Please make  sure to bring all of your medication to the appointment and arrive 15 minutes early. Thank you! Contact information: [address] [place] [phone]       [facility] Follow up.   Specialty: Gynecology Why: Please call and make an appointment within the next month. Contact information: [address] [place] [phone] [facility] Course by problem list: 1. Acute on chronic biventricular heart failure: Patient was previously hospitalized in May 2021 for recurrent SVT and heart failure exacerbation. At that time patient was found to have combined systolic and diastolic heart failure but had not been to her follow-up appointments and was nonadherent to medication regimen. On admission for this hospitalization, patient was grossly hypervolemic with 3+ pitting edema and elevated BNP >4500. Repeat Echo during this admission showed a marked decrease in systolic function over the last year, with EF found to be 40-45% last May and now decreased to 20-25%. Patient also found to have moderate RV dysfunction and moderate pulmonary arterial hypertension. Cardiology was consulted for assistance. Patient was diuresed with appropriate response. Titrated lasix due to fluctuating renal function and electrolyte abnormalities. Therefore, plan to discharge patient with lasix dosing three times per week and titrate as tolerated. We will schedule follow-up appointment with IMTS within a week of discharge for follow-up labs prior to her scheduled PCP visit with [facility] at the end of February.  2. Recurrent AVNRT: Patient previously diagnosed with paroxysmal SVT a few years ago when she was living in [place]. Previously was on nifedipine and was discharged last May with carvedilol. On arrival to ED, patient was in SVT and subsequently converted to normal sinus rhythm with 2 doses of adenosine  after vagal maneuvers were not successful. Patient thought to be in SVT due to hypervolemic state 2/2 heart failure exacerbation. Patient started on metoprolol and cardiology consulted. Patient had multiple episodes of SVT and NSVT while inpatient. Per cardiology, patient started on amiodarone with 7 days of 400mg  twice daily followed by 200mg  daily. At discharge, patient was in normal sinus rhythm without an episode of SVT within the last 48 hours.  3. Acute kidney injury: Prior to arrival in ED, last sCr 0.9 from May 2021. On arrival, patient had elevated sCr 1.92, thought to be 2/2 heart failure exacerbation. Patient responded appropriately to diuresis with decrease in sCr to 1.05 over the next few days. It was also noted patient had mild hydronephrosis on CT scan, but continued to have good urine output. As diuresis continued through the hospital course, patient's kidney function worsened. sCr was elevated to 1.78 with lasix. This was most likely in the setting of over-diuresis. Due to her kidney function, patient was discharged with lasix 20mg  three times weekly. Moving forward, it is imperative that the patient follows up with PCP for further titration of lasix.   4. Acute symptomatic hypertension: On arrival to ED, patient's blood pressure elevated in systolic 0000000, up to 210. Patient subsequently started on IV nitroglycerin gtt, hydralazine, metoprolol for blood pressure control. Throughout rest of hospitalization patient's blood pressure returned to normal. It will be imperative patient continues to  take her medications as prescribed to prevent further similar episodes.   5. Large uterine fibroids: On admission, patient had palpable abdominal masses from the suprapubic region. Previously has known fibroids with MR pelvis from 2019 revealing >30 fibroids, some of which exophytic and intracavitary. CT abdomen/pelvis during this admission revealed 23cm mass most likely representing uterine fibroid.  Gynecology subsequently consulted. Surgery was not recommended at this time given her co-morbidities and current illness. Patient agreed and will follow-up with gynecology in outpatient setting.  6. Iron deficiency anemia 2/2 menometrorrhagia: Previous known history of chronic anemia. Throughout her hospitalization, no signs of active bleeding. Per gynecology, it was recommended that the patient is on medication to control her periods. Therefore, patient was offered depo provera, which she accepted. Patient's H/H continued to be stable throughout her hospitalization. She is to follow-up with gynecology in outpatient setting for further management.  7. Paranoia, psychosis: During patient's hospitalization, it was noticed that she seemed to have difficulty processing thoughts and appeared anxious. These symptoms occurred after her blood pressure was under control. IMTS reached out to patient's family, who confirmed the patient has a history of paranoia, but her symptoms had worsened over the last few weeks. Psychiatry was consulted for further assistance. On 1/26 it was recommended that the patient should be admitted for inpatient psychiatry once medically cleared. At that point, Seroquel was started. However, given patient's side effects of somnolence and tachycardia, medication was switched to Abilify. Patient continued to require hospitalization given her cardiac work-up. Once she was medically stable for discharge, patient re-evaluated for need for inpatient psychiatry. At that time it was determined she was clear for discharge to home and will follow-up with outpatient psychiatry.   Discharge Exam:   BP 115/71 (BP Location: Left Arm)   Pulse 77   Temp (!) 97.1 F (36.2 C) (Oral)   Resp 16   Ht 5\' 6"  (1.676 m)   Wt 57 kg   SpO2 100%   BMI 20.28 kg/m  Discharge exam:  General: Pleasant, appears comfortable, no acute distress Extremities: No pitting edema bilaterally. Psych: Normal speech,  affect  Pertinent Labs, Studies, and Procedures:  CBC Latest Ref Rng & Units 01/27/2021 01/26/2021 01/25/2021  WBC 4.0 - 10.5 K/uL 9.2 9.3 7.9  Hemoglobin 12.0 - 15.0 g/dL 7.6(L) 8.3(L) 7.8(L)  Hematocrit 36.0 - 46.0 % 27.3(L) 28.1(L) 26.5(L)  Platelets 150 - 400 K/uL 582(H) 515(H) 408(H)   BMP Latest Ref Rng & Units 01/27/2021 01/26/2021 01/25/2021  Glucose 70 - 99 mg/dL 98 130(H) 105(H)  BUN 6 - 20 mg/dL 41(H) 42(H) 38(H)  Creatinine 0.44 - 1.00 mg/dL 1.69(H) 1.78(H) 1.64(H)  Sodium 135 - 145 mmol/L 133(L) 133(L) 132(L)  Potassium 3.5 - 5.1 mmol/L 4.1 4.1 4.1  Chloride 98 - 111 mmol/L 94(L) 93(L) 93(L)  CO2 22 - 32 mmol/L 25 26 28   Calcium 8.9 - 10.3 mg/dL 9.1 9.4 9.1   TTE 1/20: LVEF 20-25%. LV demonstrates global hypokinesis. LV internal cavity size mildly to moderately dilated. Moderate concentric left ventricular hypertrophy. Grade II diastolic dysfunction present. Elevated LVEDP. Global longitudinal strain is abnormal. RV systolic function moderately reduced. Moderately elevated pulmonary artery systolic pressure. LA mildly dilated. RA mildly dilated. Moderate pericardial effusion, no evidence of tamponade. Mild mitral regurgitation.   CT abdomen/pelvis 1/20: Large 23cm mass arising from patient's pelvis. Favored to be uterine in origin, however evaluation limited by lack of IV contrast. This may represent large fibroid uterus. Small volume free fluid in abdomen and pelvis.  Significant cardiomegaly with small to moderate-sized pericardial effusion.  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 Similar small acute right superior cerebellar lacunar infarct. No associated acute hemorrhage or mass effect. Intrinsic T1 signal prior to contrast suggesting recent IV iron administration, no definite abnormal enhancement. Diffusely abnormal marrow signal suggesting  hemosiderosis.  Renal ultrasound 2/2: No acute abnormality noted.   Discharge Instructions:  Holly Bradley, I am so glad you are feeling better! You were admitted because of an abnormal heart rhythm and because your heart is very weak, which caused fluid to build up. Please see the following notes about your medications:  For your abnormal rhythm, please make sure to take amiodarone, 200mg , once DAILY.  To strengthen your heart and to make sure fluid does not build up on your lungs and legs, please make sure to follow the following instructions regarding other medications.  STOP taking these medications that you were taking before: carvedilol (coreg), lisinopril (zestiril) CHANGE how you take furosemide. Do not take 40mg  daily. Instead, you will take 20mg  on Mondays, Wednesdays, and Fridays. START taking isosorbide dinitrate-hydralazine 20-37.5mg  three times daily START taking metoprolol 200mg  XL once daily  You will also taking apiprazole (abilify) 2mg  daily.  -We would like to see you in clinic later this week. The clinic will call you on Monday, 2/7, to schedule your appointment. You have an appointment with the [facility] on 2/21, but we need to see you next week to check your blood levels.  -Please make sure to call the gynecologist's office listed in your paperwork as well. It is important that you make an appointment to see them.   It was a pleasure meeting you, Holly Bradley. I wish you the best and hope you stay happy and healthy!  Thank you, Sanjuan Dame, MD  Signed: Sanjuan Dame, MD 01/27/2021, 1:35 PM   Pager: [phone]

## 2021-01-29 ENCOUNTER — Telehealth: Payer: Self-pay | Admitting: Student

## 2021-01-29 ENCOUNTER — Inpatient Hospital Stay: Payer: BLUE CROSS/BLUE SHIELD | Attending: Gynecologic Oncology | Admitting: Gynecologic Oncology

## 2021-01-29 NOTE — Telephone Encounter (Signed)
TOC HFU APPT [facility] FOR 02/05/2021 @ 2:15 PM WITH DR Beryle Beams

## 2021-01-30 ENCOUNTER — Telehealth: Payer: Self-pay | Admitting: *Deleted

## 2021-01-30 ENCOUNTER — Other Ambulatory Visit: Payer: Self-pay | Admitting: Internal Medicine

## 2021-01-30 MED ORDER — ISOSORB DINITRATE-HYDRALAZINE 20-37.5 MG PO TABS: 2.0000 | ORAL_TABLET | Freq: Three times a day (TID) | ORAL | 0 refills | Status: DC

## 2021-01-30 MED ORDER — METOPROLOL SUCCINATE ER 200 MG PO TB24: 200.0000 mg | ORAL_TABLET | Freq: Every day | ORAL | 0 refills | Status: DC

## 2021-01-30 MED ORDER — AMIODARONE HCL 200 MG PO TABS
200.0000 mg | ORAL_TABLET | Freq: Every day | ORAL | 0 refills | Status: DC
Start: 2021-01-30 — End: 2021-02-02

## 2021-01-30 NOTE — Telephone Encounter (Addendum)
Call from pt's brother,[name], who is concern that pt is weak, not eating, vomiting, and having dizzy spells. Pt is a HFU who was discharged on Saturday. Pt's brother thinks she was discharged too soon. She is drinking some water and orange juice. Last vomiting episode was this morning; stated it was a small amount.  She's self ambulating but only short distances .Original HFU appt was 2/14; he requested pt to be seen sooner, front office changed appt to tomorrow 2/9 @ 0845 AM. Instructed pt's brother if her symptoms worsen, to take her to the ED; voiced understanding. Do you agree?

## 2021-01-30 NOTE — Telephone Encounter (Signed)
Dr. Evette Doffing and I agree with plan for her to come to the clinic in the morning, and to go to ED if symptoms worsen.

## 2021-01-31 ENCOUNTER — Ambulatory Visit (INDEPENDENT_AMBULATORY_CARE_PROVIDER_SITE_OTHER): Payer: BLUE CROSS/BLUE SHIELD | Admitting: Internal Medicine

## 2021-01-31 ENCOUNTER — Other Ambulatory Visit: Payer: Self-pay

## 2021-01-31 VITALS — BP 164/95 | HR 95 | Temp 98.5°F | Ht 65.5 in | Wt 135.9 lb

## 2021-01-31 DIAGNOSIS — F29 Unspecified psychosis not due to a substance or known physiological condition: Secondary | ICD-10-CM | POA: Diagnosis not present

## 2021-01-31 DIAGNOSIS — R112 Nausea with vomiting, unspecified: Secondary | ICD-10-CM | POA: Insufficient documentation

## 2021-01-31 DIAGNOSIS — I5032 Chronic diastolic (congestive) heart failure: Secondary | ICD-10-CM | POA: Diagnosis not present

## 2021-01-31 DIAGNOSIS — D649 Anemia, unspecified: Secondary | ICD-10-CM | POA: Diagnosis not present

## 2021-01-31 LAB — CBC WITH DIFFERENTIAL/PLATELET
Abs Immature Granulocytes: 0.04 10*3/uL (ref 0.00–0.07)
Basophils Absolute: 0 10*3/uL (ref 0.0–0.1)
Basophils Relative: 0 %
Eosinophils Absolute: 0 10*3/uL (ref 0.0–0.5)
Eosinophils Relative: 0 %
HCT: 28.3 % — ABNORMAL LOW (ref 36.0–46.0)
Hemoglobin: 7.7 g/dL — ABNORMAL LOW (ref 12.0–15.0)
Immature Granulocytes: 0 %
Lymphocytes Relative: 9 %
Lymphs Abs: 1 10*3/uL (ref 0.7–4.0)
MCH: 19.7 pg — ABNORMAL LOW (ref 26.0–34.0)
MCHC: 27.2 g/dL — ABNORMAL LOW (ref 30.0–36.0)
MCV: 72.4 fL — ABNORMAL LOW (ref 80.0–100.0)
Monocytes Absolute: 1.1 10*3/uL — ABNORMAL HIGH (ref 0.1–1.0)
Monocytes Relative: 11 %
Neutro Abs: 8.5 10*3/uL — ABNORMAL HIGH (ref 1.7–7.7)
Neutrophils Relative %: 80 %
Platelets: 778 10*3/uL — ABNORMAL HIGH (ref 150–400)
RBC: 3.91 MIL/uL (ref 3.87–5.11)
RDW: 32.8 % — ABNORMAL HIGH (ref 11.5–15.5)
WBC: 10.7 10*3/uL — ABNORMAL HIGH (ref 4.0–10.5)
nRBC: 0 % (ref 0.0–0.2)

## 2021-01-31 LAB — COMPREHENSIVE METABOLIC PANEL
ALT: 21 U/L (ref 0–44)
AST: 21 U/L (ref 15–41)
Albumin: 2.9 g/dL — ABNORMAL LOW (ref 3.5–5.0)
Alkaline Phosphatase: 113 U/L (ref 38–126)
Anion gap: 13 (ref 5–15)
BUN: 25 mg/dL — ABNORMAL HIGH (ref 6–20)
CO2: 26 mmol/L (ref 22–32)
Calcium: 9.9 mg/dL (ref 8.9–10.3)
Chloride: 100 mmol/L (ref 98–111)
Creatinine, Ser: 1.26 mg/dL — ABNORMAL HIGH (ref 0.44–1.00)
GFR, Estimated: 53 mL/min — ABNORMAL LOW (ref >60)
Glucose, Bld: 76 mg/dL (ref 70–99)
Potassium: 4.3 mmol/L (ref 3.5–5.1)
Sodium: 139 mmol/L (ref 135–145)
Total Bilirubin: 0.6 mg/dL (ref 0.3–1.2)
Total Protein: 7.6 g/dL (ref 6.5–8.1)

## 2021-01-31 NOTE — Assessment & Plan Note (Addendum)
She was discharged from hospital on Saturday (5 days ago) but reports some nausea and vomited twice yesterday. She was able to have some toast and tolerated that though. She states that she had a tempreture of 101 yesterday. Nausea, vomiting, some residual of cough after COVID infection but no major SOB.   She is hypertensive 164/95 but otherwise VS are stable. Heart and lung exam are unremarkable. No tachycardia to suggest SVT at this point. She had an episode of dizziness past few days though. recommended her to pick up her medications today (amiodarone, metoprolol,..) and restart them ASAP. Her BP less likely to be cause of her N/V.  Will check CBC, blood culture and CMP. If sever AKI or electrolyte abnormality or leukocytosis, or if worsening of vomiting, may need to admit her in the hospital. Patient is not willing to stay in clinic till stat blood work will be resulted. She will go home and we will call her with the result. -Stat CBC w diff  -Stat CMP  -Blood culture  ADDENDUM: Labs w improvement of AKI. No leukocytosis. Hb is low but stable at 7. No labs abnormality to explain her nausea and vomiting and fever yesterday. Informed patient's brother. He agrees to pick up her medications and start them and if no improvement, will let us know. If her vomiting reoccur or worsens, will call the clinic. ED visit precautions reviewed with patient.

## 2021-01-31 NOTE — Telephone Encounter (Signed)
Transition Care Management Follow-up Telephone Call  Date of discharge and from where: DIischarged 01/27/21 from the hospital.  How have you been since you were released from the hospital? Pt's brother called yesterday; see encounter '"Call from pt's brother,[name], who is concern that pt is weak, not eating, vomiting, and having dizzy spells. Pt is a HFU who was discharged on Saturday. Pt's brother thinks she was discharged too soon. She is drinking some water and orange juice. Last vomiting episode was this morning; stated it was a small amount.  She's self ambulating but only short distances .Original HFU appt was 2/14; he requested pt to be seen sooner, front office changed appt to tomorrow 2/9 @ 0845 AM. Instructed pt's brother if her symptoms worsen, to take her to the ED; voiced understanding."   Pt is currently here in our office being seen by Dr Myrtie Hawk.

## 2021-01-31 NOTE — Patient Instructions (Addendum)
Thank you for allowing [place] to provide your care today.  You came in for nausea and vomiting and not feeling well.   I am checking some lab work to see if you need to be hospitalized again.  Please pick up your medications and start taking them.  I will call regarding lab results and next step.    Please make sure to follow up with GYN physician for your fibroid.  As always, if having severe symptoms, please seek medical attention at emergency room. Should you have any questions or concerns please call the internal medicine clinic at [phone].    Thank you!

## 2021-01-31 NOTE — Progress Notes (Signed)
Acute Office Visit  Subjective:    Patient ID: Holly Bradley, female    DOB: 1976-01-08, 45 y.o.   MRN: [id number]  Chief Complaint  Patient presents with  . Follow-up    HFU    HPI Patient is in today for hospital follow up and complaining of nausea and vomiting. Please refer to problem based charting for further details and assessment and plan of current problem and chronic medical conditions.   Past Medical History:  Diagnosis Date  . Chronic blood loss anemia    Related to uterine fibroids  . Essential hypertension    Poorly controlled, repeat by report only on nicardipine 90 mg  . Fibroid    Likely complicated by by menometrorrhagia and chronic anemia  . Paroxysmal SVT (supraventricular tachycardia) (HCC)    Frequent episodes, can last anywhere from 20 minutes to 12-15 hours.  Not on beta-blocker or calcium channel blocker    Past Surgical History:  Procedure Laterality Date  . Unknown      Family History  Family history unknown: Yes    Social History   Socioeconomic History  . Marital status: Single    Spouse name: Not on file  . Number of children: Not on file  . Years of education: Not on file  . Highest education level: Not on file  Occupational History  . Not on file  Tobacco Use  . Smoking status: Not on file  . Smokeless tobacco: Not on file  Substance and Sexual Activity  . Alcohol use: Not on file  . Drug use: Not on file  . Sexual activity: Not on file  Other Topics Concern  . Not on file  Social History Narrative  . Not on file   Social Determinants of Health   Financial Resource Strain: Not on file  Food Insecurity: Not on file  Transportation Needs: Not on file  Physical Activity: Not on file  Stress: Not on file  Social Connections: Not on file  Intimate Partner Violence: Not on file    No facility-administered medications prior to visit.   Outpatient Medications Prior to Visit  Medication Sig Dispense Refill  .  amiodarone (PACERONE) 200 MG tablet Take 1 tablet (200 mg total) by mouth daily. 30 tablet 0  . ARIPiprazole (ABILIFY) 2 MG tablet Take 1 tablet (2 mg total) by mouth at bedtime. 30 tablet 0  . furosemide (LASIX) 20 MG tablet Take 1 tablet (20 mg total) by mouth every Monday, Wednesday, and Friday for 12 doses. 12 tablet 0  . isosorbide-hydrALAZINE (BIDIL) 20-37.5 MG tablet Take 2 tablets by mouth 3 (three) times daily. 180 tablet 0  . metoprolol (TOPROL-XL) 200 MG 24 hr tablet Take 1 tablet (200 mg total) by mouth daily. Take with or immediately following a meal. 30 tablet 0  . Multiple Vitamin (MULTIVITAMIN WITH MINERALS) TABS tablet Take 1 tablet by mouth daily. 30 tablet 0    No Known Allergies  Review of Systems     Objective:    Physical Exam  BP (!) 164/95 (BP Location: Left Arm)   Pulse 95   Temp 98.5 F (36.9 C)   Ht 5' 5.5" (1.664 m)   Wt 135 lb 14.4 oz (61.6 kg)   SpO2 100%   BMI 22.27 kg/m  Wt Readings from Last 3 Encounters:  01/31/21 135 lb 14.4 oz (61.6 kg)  01/27/21 125 lb 10.6 oz (57 kg)  05/03/20 160 lb 8 oz (72.8 kg)   Constitutional: No acute  distress but is slightly uncomfortable due to nausea Head: Normocephalic and atraumatic.  Cardiovascular: RRR, nl S1S2, no murmur, trace bilateral LEE Respiratory: Effort normal and breath sounds normal. No respiratory distress. No wheezes.  GI: Distended, chronic soft mass is palpable. There is no tenderness.  Neurological: Is alert and oriented x 3 Skin: Not diaphoretic. No erythema.  Psychiatric: Normal mood and affect. Behavior is normal. Judgment and thought content normal.   Health Maintenance Due  Topic Date Due  . Hepatitis C Screening  Never done  . COVID-19 Vaccine (1) Never done  . TETANUS/TDAP  Never done  . PAP SMEAR-Modifier  Never done  . COLONOSCOPY (Pts 45-26yrs Insurance coverage will need to be confirmed)  Never done  . INFLUENZA VACCINE  Never done    There are no preventive care reminders  to display for this patient.   Lab Results  Component Value Date   TSH 1.953 01/23/2021   Lab Results  Component Value Date   WBC 10.4 02/01/2021   HGB 8.3 (L) 02/01/2021   HCT 28.6 (L) 02/01/2021   MCV 71.9 (L) 02/01/2021   PLT 720 (H) 02/01/2021   Lab Results  Component Value Date   NA 137 02/01/2021   K 4.7 02/01/2021   CO2 25 02/01/2021   GLUCOSE 97 02/01/2021   BUN 30 (H) 02/01/2021   CREATININE 1.44 (H) 02/01/2021   BILITOT 0.6 01/31/2021   ALKPHOS 113 01/31/2021   AST 21 01/31/2021   ALT 21 01/31/2021   PROT 7.6 01/31/2021   ALBUMIN 2.9 (L) 01/31/2021   CALCIUM 9.8 02/01/2021   ANIONGAP 13 02/01/2021   No results found for: CHOL No results found for: HDL No results found for: LDLCALC No results found for: TRIG No results found for: CHOLHDL Lab Results  Component Value Date   HGBA1C 5.6 01/10/2021       Assessment & Plan:   Problem List Items Addressed This Visit      Digestive   Nausea and vomiting - Primary    She was discharged from hospital on Saturday (5 days ago) but reports some nausea and vomited twice yesterday. She was able to have some toast and tolerated that though. She states that she had a tempreture of 101 yesterday. Nausea, vomiting, some residual of cough after COVID infection but no major SOB.   She is hypertensive 164/95 but otherwise VS are stable. Heart and lung exam are unremarkable. No tachycardia to suggest SVT at this point. She had an episode of dizziness past few days though. recommended her to pick up her medications today (amiodarone, metoprolol,..) and restart them ASAP. Her BP less likely to be cause of her N/V.  Will check CBC, blood culture and CMP. If sever AKI or electrolyte abnormality or leukocytosis, or if worsening of vomiting, may need to admit her in the hospital. Patient is not willing to stay in clinic till stat blood work will be resulted. She will go home and we will call her with the result. -Stat CBC w diff   -Stat CMP  -Blood culture  ADDENDUM: Labs w improvement of AKI. No leukocytosis. Hb is low but stable at 7. No labs abnormality to explain her nausea and vomiting and fever yesterday. Informed patient's brother. He agrees to pick up her medications and start them and if no improvement, will let us know. If her vomiting reoccur or worsens, will call the clinic. ED visit precautions reviewed with patient.  Relevant Orders   CBC with Diff (Completed)   CMP w Anion Gap (STAT/Sunquest-performed on-site) (Completed)   Culture, Blood (Completed)        No orders of the defined types were placed in this encounter.    Dewayne Hatch, MD

## 2021-02-01 ENCOUNTER — Encounter (HOSPITAL_COMMUNITY): Payer: Self-pay | Admitting: Emergency Medicine

## 2021-02-01 ENCOUNTER — Emergency Department (HOSPITAL_COMMUNITY): Payer: BLUE CROSS/BLUE SHIELD

## 2021-02-01 ENCOUNTER — Observation Stay (HOSPITAL_COMMUNITY)
Admission: EM | Admit: 2021-02-01 | Discharge: 2021-02-02 | Disposition: A | Discharge status: Home / Self Care | Payer: BLUE CROSS/BLUE SHIELD | Attending: Cardiology | Admitting: Cardiology

## 2021-02-01 ENCOUNTER — Other Ambulatory Visit: Payer: Self-pay

## 2021-02-01 DIAGNOSIS — R1900 Intra-abdominal and pelvic swelling, mass and lump, unspecified site: Secondary | ICD-10-CM | POA: Diagnosis present

## 2021-02-01 DIAGNOSIS — U071 COVID-19: Secondary | ICD-10-CM | POA: Diagnosis not present

## 2021-02-01 DIAGNOSIS — I110 Hypertensive heart disease with heart failure: Secondary | ICD-10-CM | POA: Diagnosis not present

## 2021-02-01 DIAGNOSIS — D649 Anemia, unspecified: Secondary | ICD-10-CM | POA: Diagnosis present

## 2021-02-01 DIAGNOSIS — I471 Supraventricular tachycardia, unspecified: Secondary | ICD-10-CM | POA: Diagnosis present

## 2021-02-01 DIAGNOSIS — R0602 Shortness of breath: Secondary | ICD-10-CM | POA: Diagnosis present

## 2021-02-01 DIAGNOSIS — Z5941 Food insecurity: Secondary | ICD-10-CM

## 2021-02-01 DIAGNOSIS — D219 Benign neoplasm of connective and other soft tissue, unspecified: Secondary | ICD-10-CM | POA: Diagnosis present

## 2021-02-01 DIAGNOSIS — Z79899 Other long term (current) drug therapy: Secondary | ICD-10-CM | POA: Diagnosis not present

## 2021-02-01 DIAGNOSIS — I5043 Acute on chronic combined systolic (congestive) and diastolic (congestive) heart failure: Secondary | ICD-10-CM | POA: Diagnosis not present

## 2021-02-01 DIAGNOSIS — N179 Acute kidney failure, unspecified: Secondary | ICD-10-CM | POA: Diagnosis present

## 2021-02-01 DIAGNOSIS — I509 Heart failure, unspecified: Secondary | ICD-10-CM

## 2021-02-01 LAB — CBC
HCT: 28.6 % — ABNORMAL LOW (ref 36.0–46.0)
HCT: 25.6 % — ABNORMAL LOW (ref 36.0–46.0)
Hemoglobin: 8.3 g/dL — ABNORMAL LOW (ref 12.0–15.0)
Hemoglobin: 7.3 g/dL — ABNORMAL LOW (ref 12.0–15.0)
MCH: 20.9 pg — ABNORMAL LOW (ref 26.0–34.0)
MCH: 20.7 pg — ABNORMAL LOW (ref 26.0–34.0)
MCHC: 29 g/dL — ABNORMAL LOW (ref 30.0–36.0)
MCHC: 28.5 g/dL — ABNORMAL LOW (ref 30.0–36.0)
MCV: 71.9 fL — ABNORMAL LOW (ref 80.0–100.0)
MCV: 72.5 fL — ABNORMAL LOW (ref 80.0–100.0)
Platelets: 720 10*3/uL — ABNORMAL HIGH (ref 150–400)
Platelets: 673 10*3/uL — ABNORMAL HIGH (ref 150–400)
RBC: 3.98 MIL/uL (ref 3.87–5.11)
RBC: 3.53 MIL/uL — ABNORMAL LOW (ref 3.87–5.11)
RDW: 33.8 % — ABNORMAL HIGH (ref 11.5–15.5)
RDW: 33.7 % — ABNORMAL HIGH (ref 11.5–15.5)
WBC: 10.4 10*3/uL (ref 4.0–10.5)
WBC: 12.1 10*3/uL — ABNORMAL HIGH (ref 4.0–10.5)
nRBC: 0 % (ref 0.0–0.2)
nRBC: 0.2 % (ref 0.0–0.2)

## 2021-02-01 LAB — BASIC METABOLIC PANEL
Anion gap: 13 (ref 5–15)
BUN: 30 mg/dL — ABNORMAL HIGH (ref 6–20)
CO2: 25 mmol/L (ref 22–32)
Calcium: 9.8 mg/dL (ref 8.9–10.3)
Chloride: 99 mmol/L (ref 98–111)
Creatinine, Ser: 1.44 mg/dL — ABNORMAL HIGH (ref 0.44–1.00)
GFR, Estimated: 45 mL/min — ABNORMAL LOW (ref >60)
Glucose, Bld: 97 mg/dL (ref 70–99)
Potassium: 4.7 mmol/L (ref 3.5–5.1)
Sodium: 137 mmol/L (ref 135–145)

## 2021-02-01 LAB — CREATININE, SERUM
Creatinine, Ser: 1.56 mg/dL — ABNORMAL HIGH (ref 0.44–1.00)
GFR, Estimated: 41 mL/min — ABNORMAL LOW (ref >60)

## 2021-02-01 LAB — I-STAT BETA HCG BLOOD, ED (MC, WL, AP ONLY): I-stat hCG, quantitative: <5 m[IU]/mL (ref <5)

## 2021-02-01 LAB — TROPONIN I (HIGH SENSITIVITY)
Troponin I (High Sensitivity): 32 ng/L — ABNORMAL HIGH (ref <18)
Troponin I (High Sensitivity): 36 ng/L — ABNORMAL HIGH (ref <18)

## 2021-02-01 LAB — SARS CORONAVIRUS 2 (TAT 6-24 HRS): SARS Coronavirus 2: POSITIVE — AB

## 2021-02-01 LAB — MAGNESIUM: Magnesium: 2 mg/dL (ref 1.7–2.4)

## 2021-02-01 LAB — BRAIN NATRIURETIC PEPTIDE: B Natriuretic Peptide: 2098.5 pg/mL — ABNORMAL HIGH (ref 0.0–100.0)

## 2021-02-01 IMAGING — DX DG CHEST 1V PORT
1 series · 1 of 1 positions shown · non-contrast
Comparison: [DATE]

CLINICAL DATA: Supraventricular tachycardia

EXAM:
PORTABLE CHEST 1 VIEW

[chest ap]
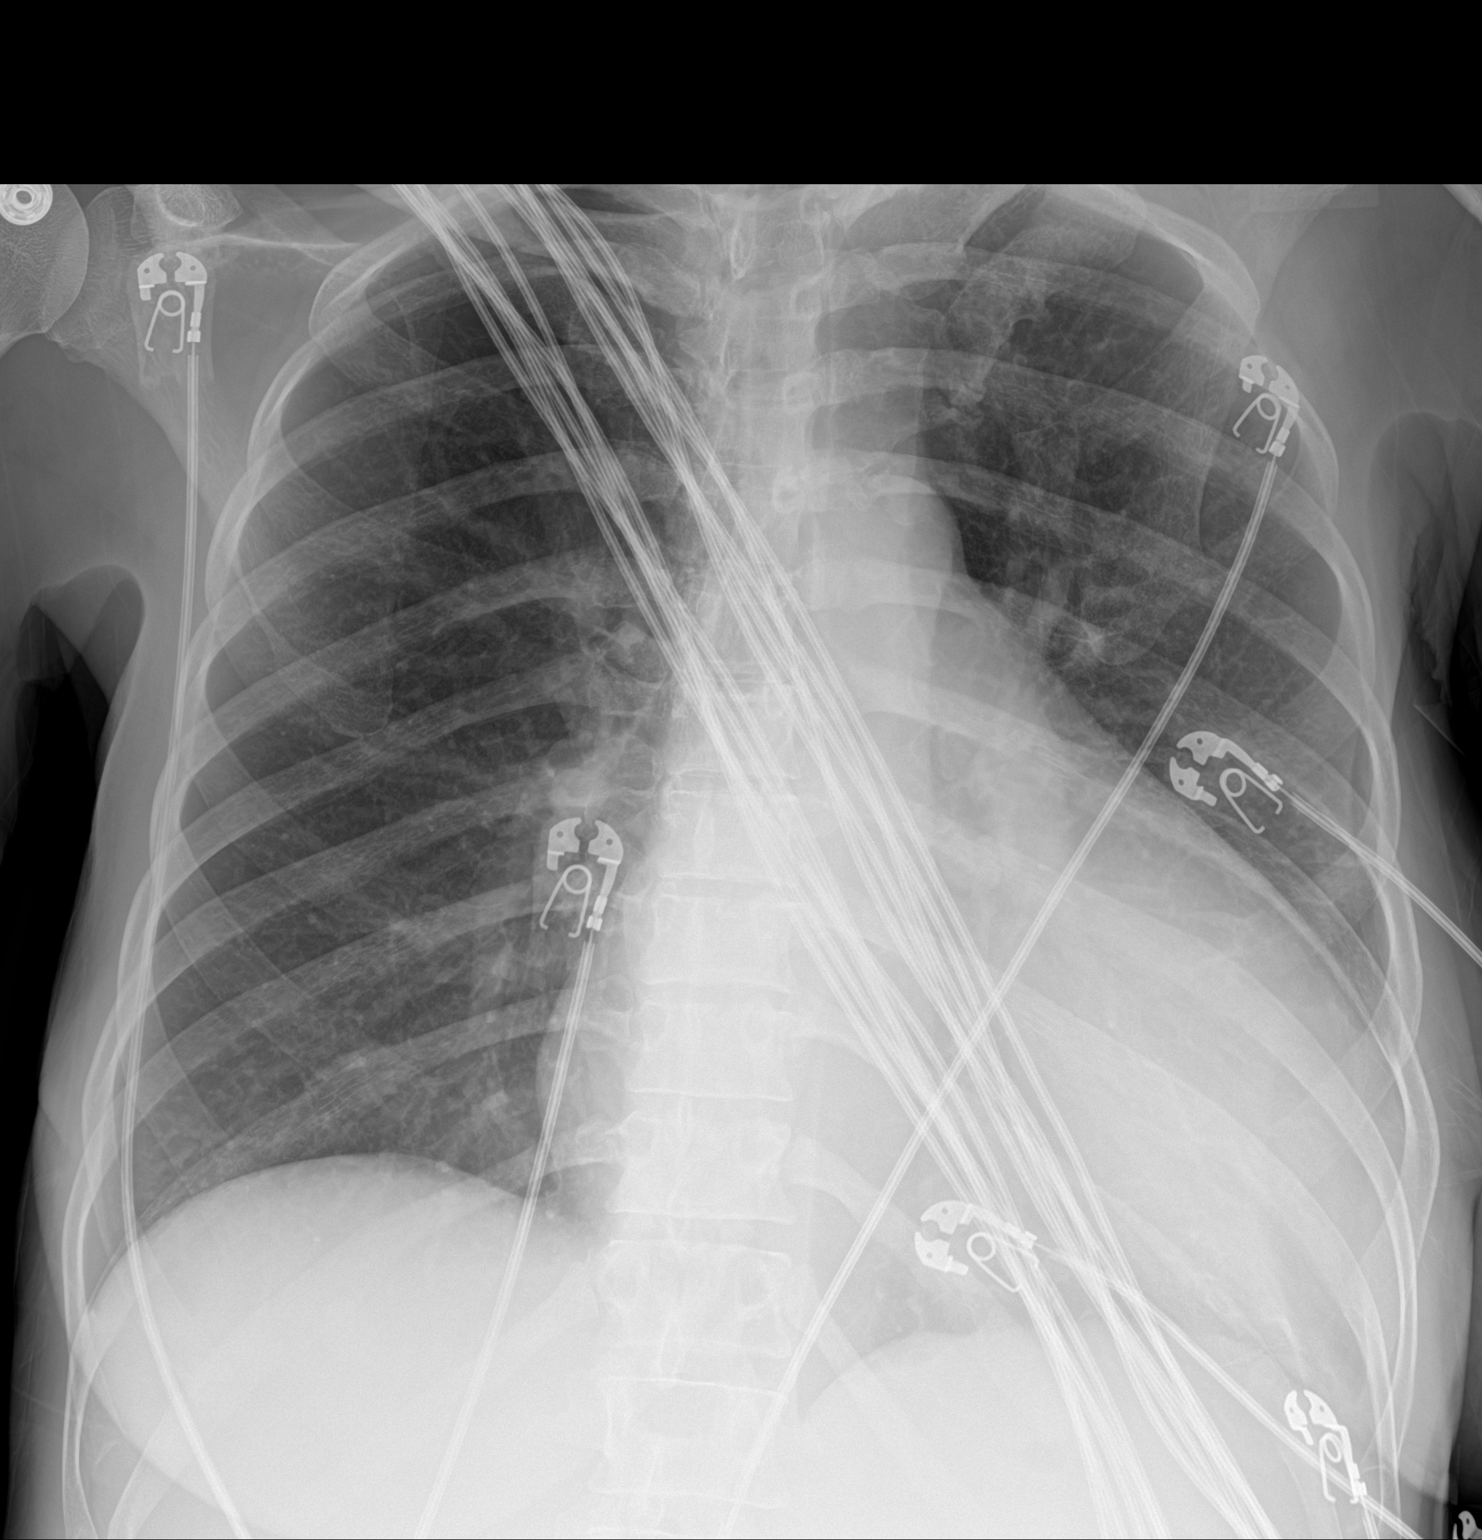

[1 of 1 positions shown; findings below may reference images not displayed]

FINDINGS: Unchanged cardiomegaly. No significant pulmonary vascular
congestion. Lungs are clear.
IMPRESSION: Unchanged cardiomegaly.

## 2021-02-01 MED ORDER — HEPARIN SODIUM (PORCINE) 5000 UNIT/ML IJ SOLN: 5000.0000 [IU] | Freq: Three times a day (TID) | INTRAMUSCULAR | Status: DC

## 2021-02-01 MED ORDER — SODIUM CHLORIDE 0.9% FLUSH
3.0000 mL | INTRAVENOUS | Status: DC | PRN
Start: 2021-02-01 — End: 2021-02-02

## 2021-02-01 MED ORDER — ACETAMINOPHEN 325 MG PO TABS: 650.0000 mg | ORAL_TABLET | ORAL | Status: DC | PRN

## 2021-02-01 MED ORDER — AMIODARONE HCL 200 MG PO TABS
200.0000 mg | ORAL_TABLET | Freq: Every day | ORAL | Status: DC
  Administered 2021-02-01 – 2021-02-02 (×3): 200 mg via ORAL
  Filled 2021-02-01 (×3): qty 1

## 2021-02-01 MED ORDER — METOPROLOL SUCCINATE ER 100 MG PO TB24
200.0000 mg | ORAL_TABLET | Freq: Every day | ORAL | Status: DC
  Administered 2021-02-01 – 2021-02-02 (×2): 200 mg via ORAL
  Filled 2021-02-01 (×3): qty 2

## 2021-02-01 MED ORDER — ADULT MULTIVITAMIN W/MINERALS CH
1.0000 | ORAL_TABLET | Freq: Every day | ORAL | Status: DC
  Administered 2021-02-01 – 2021-02-02 (×2): 1 via ORAL
  Filled 2021-02-01 (×2): qty 1

## 2021-02-01 MED ORDER — ISOSORB DINITRATE-HYDRALAZINE 20-37.5 MG PO TABS
2.0000 | ORAL_TABLET | Freq: Three times a day (TID) | ORAL | Status: DC
  Administered 2021-02-01 – 2021-02-02 (×2): 2 via ORAL
  Filled 2021-02-01 (×5): qty 2

## 2021-02-01 MED ORDER — HEPARIN SODIUM (PORCINE) 5000 UNIT/ML IJ SOLN
5000.0000 [IU] | Freq: Three times a day (TID) | INTRAMUSCULAR | Status: DC
  Administered 2021-02-01 – 2021-02-02 (×2): 5000 [IU] via SUBCUTANEOUS
  Filled 2021-02-01: qty 1

## 2021-02-01 MED ORDER — ADENOSINE 6 MG/2ML IV SOLN: 6.0000 mg | Freq: Once | INTRAVENOUS | Status: DC

## 2021-02-01 MED ORDER — ONDANSETRON HCL 4 MG/2ML IJ SOLN: 4.0000 mg | Freq: Four times a day (QID) | INTRAMUSCULAR | Status: DC | PRN

## 2021-02-01 MED ORDER — ARIPIPRAZOLE 2 MG PO TABS
2.0000 mg | ORAL_TABLET | Freq: Every day | ORAL | Status: DC
  Administered 2021-02-01: 2 mg via ORAL
  Filled 2021-02-01 (×2): qty 1

## 2021-02-01 MED ORDER — AMIODARONE HCL 200 MG PO TABS
200.0000 mg | ORAL_TABLET | Freq: Every day | ORAL | Status: DC
  Administered 2021-02-01: 200 mg via ORAL

## 2021-02-01 MED ORDER — METOPROLOL SUCCINATE ER 100 MG PO TB24
200.0000 mg | ORAL_TABLET | Freq: Every day | ORAL | Status: DC
  Administered 2021-02-01: 200 mg via ORAL

## 2021-02-01 MED ORDER — SODIUM CHLORIDE 0.9% FLUSH
3.0000 mL | Freq: Two times a day (BID) | INTRAVENOUS | Status: DC
  Administered 2021-02-02: 3 mL via INTRAVENOUS

## 2021-02-01 MED ORDER — SODIUM CHLORIDE 0.9 % IV SOLN: 250.0000 mL | INTRAVENOUS | Status: DC | PRN

## 2021-02-01 MED ORDER — ONDANSETRON HCL 4 MG/2ML IJ SOLN
4.0000 mg | Freq: Once | INTRAMUSCULAR | Status: AC
  Administered 2021-02-01: 4 mg via INTRAVENOUS
  Filled 2021-02-01: qty 2

## 2021-02-01 MED ORDER — ENSURE ENLIVE PO LIQD
237.0000 mL | Freq: Two times a day (BID) | ORAL | Status: DC
  Administered 2021-02-01: 237 mL via ORAL
  Filled 2021-02-01 (×2): qty 237

## 2021-02-01 MED ORDER — FUROSEMIDE 20 MG PO TABS
20.0000 mg | ORAL_TABLET | Freq: Every day | ORAL | Status: DC
  Administered 2021-02-01 – 2021-02-02 (×2): 20 mg via ORAL
  Filled 2021-02-01 (×2): qty 1

## 2021-02-01 NOTE — ED Notes (Signed)
Pt back in SR , ekg repeated and shown to MD

## 2021-02-01 NOTE — Assessment & Plan Note (Signed)
Stable now but she should resume Abilify that was prescribed for her recent hospitalization.  -Instructed to pick up Abilify and restart it as instructed per last psychiatry recommendation

## 2021-02-01 NOTE — ED Triage Notes (Signed)
Patient with history of SVT states she feels like she is in SVT starting at 0500 this morning. HR 180 in triage, dyspnea.

## 2021-02-01 NOTE — Assessment & Plan Note (Addendum)
Biventricular heart failure:   Hx of was nonadherent to medication regimen and recent hospital admission with acute on chronic HF and worsening of EF. 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   She discharge home with: Amiodarone,  lasix 20 three times a week, BIDIL, Metoprolol. But she has not picked up the medications yet. HR is at 90s today and no tachycardia on serial exam in clinic. Denies palpitation. No obvious sign or symptoms of hypervolemia on exam (although weight is up 9lb today? from what documented wt in chart on the day before discharge). Needs to resume lasix.  -Recommended to pick up all of her medications and start them ASAP. -ED visit precautions such as SOB, palpitation, heart racing, chest pain or dizziness, reviewed with patient

## 2021-02-01 NOTE — Progress Notes (Addendum)
Entered in error - please disregard

## 2021-02-01 NOTE — ED Provider Notes (Signed)
[facility] EMERGENCY DEPARTMENT Provider Note   CSN: [id number] Arrival date & time: 02/01/21  1601     History Chief Complaint  Patient presents with  . SVT    Holly Bradley is a 45 y.o. 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  History of same.  Chart reviewed.  Patient recently admitted to the hospital 1/19-2/5 and was discharged with amiodarone.  Patient states she does not take any daily medications due to financial constraints.  She admits to associated central, nonradiating chest pain associated with palpitations.  Patient found to be in SVT upon arrival with heart rate in the 150s.  No treatment prior to arrival.  No aggravating or alleviating symptoms.  History obtained from patient and past medical records. No interpreter used during encounter.      Past Medical History:  Diagnosis Date  . Chronic blood loss anemia    Related to uterine fibroids  . Essential hypertension    Poorly controlled, repeat by report only on nicardipine 90 mg  . Fibroid    Likely complicated by by menometrorrhagia and chronic anemia  . Paroxysmal SVT (supraventricular tachycardia) (HCC)    Frequent episodes, can last anywhere from 20 minutes to 12-15 hours.  Not on beta-blocker or calcium channel blocker    Patient Active Problem List   Diagnosis Date Noted  . Nausea and vomiting 01/31/2021  . Psychosis ([place])   . Fever in adult   . Delirium   . Bilateral leg edema   . Fibroid 01/11/2021  . Menorrhagia 01/11/2021  . Acute exacerbation of CHF (congestive heart failure) ([place]) 01/10/2021  . Pneumonia due to COVID-19 virus    . Acute renal failure ([place])   . Anemia   . Pelvic mass in female   . Hypertensive emergency   . SVT (supraventricular tachycardia) ([place]) 05/01/2020  . CHF (congestive heart failure) ([place]) 05/01/2020    Past Surgical History:  Procedure Laterality Date  . Unknown       OB History    Gravida  0   Para  0   Term  0   Preterm  0   AB  0   Living  0     SAB  0   IAB  0   Ectopic  0   Multiple  0   Live Births  0           Family History  Family history unknown: Yes       Home Medications Prior to Admission medications   Medication Sig Start Date End Date Taking? Authorizing Provider  Ensure (ENSURE) Take 237 mLs by mouth See admin instructions. Drink 237 ml's by mouth every morning and at bedtime   Yes [provider]  amiodarone (PACERONE) 200 MG tablet Take 1 tablet (200 mg total) by mouth daily. 01/30/21 03/01/21  Jean Rosenthal, MD  ARIPiprazole (ABILIFY) 2 MG tablet Take 1 tablet (2 mg total) by mouth at bedtime. 01/27/21 02/26/21  Sanjuan Dame, MD  furosemide (LASIX) 20 MG tablet Take 1 tablet (20 mg total) by mouth every Monday, Wednesday, and Friday for 12 doses. 01/29/21 02/24/21  Sanjuan Dame, MD  isosorbide-hydrALAZINE (BIDIL) 20-37.5 MG  tablet Take 2 tablets by mouth 3 (three) times daily. 01/30/21 03/01/21  Jean Rosenthal, MD  metoprolol (TOPROL-XL) 200 MG 24 hr tablet Take 1 tablet (200 mg total) by mouth daily. Take with or immediately following a meal. 01/30/21 03/01/21  Jean Rosenthal, MD  Multiple Vitamin (MULTIVITAMIN WITH MINERALS) TABS tablet Take 1 tablet by mouth daily. 01/28/21 02/27/21  Sanjuan Dame, MD    Allergies    Patient has no known allergies.  Review of Systems   Review of Systems  Constitutional: Negative for chills and fever.  Respiratory: Positive for shortness of breath.   Cardiovascular: Positive for chest pain. Negative for leg swelling.  Gastrointestinal: Positive for nausea and vomiting. Negative for abdominal  pain and diarrhea.  All other systems reviewed and are negative.   Physical Exam Updated Vital Signs BP (!) 179/109   Pulse 96   Temp 98.8 F (37.1 C)   Resp 16   SpO2 100%   Physical Exam Vitals and nursing note reviewed.  Constitutional:      General: She is not in acute distress.    Appearance: She is not toxic-appearing.  HENT:     Head: Normocephalic.  Eyes:     Pupils: Pupils are equal, round, and reactive to light.  Cardiovascular:     Rate and Rhythm: Normal rate and regular rhythm.     Pulses: Normal pulses.     Heart sounds: Normal heart sounds. No murmur heard. No friction rub. No gallop.   Pulmonary:     Effort: Pulmonary effort is normal.     Breath sounds: Normal breath sounds.  Abdominal:     General: Abdomen is flat. Bowel sounds are normal. There is distension.     Palpations: Abdomen is soft.     Tenderness: There is no abdominal tenderness. There is no guarding or rebound.     Comments: Large palpable mass extending from suprapubic region.  Musculoskeletal:     Cervical back: Neck supple.     Comments: No pitting edema.  Skin:    General: Skin is warm and dry.  Neurological:     General: No focal deficit present.     Mental Status: She is alert.  Psychiatric:        Mood and Affect: Mood normal.        Behavior: Behavior normal.     ED Results / Procedures / Treatments   Labs (all labs ordered are listed, but only abnormal results are displayed) Labs Reviewed  BASIC METABOLIC PANEL - Abnormal; Notable for the following components:      Result Value   BUN 30 (*)    Creatinine, Ser 1.44 (*)    GFR, Estimated 45 (*)    All other components within normal limits  CBC - Abnormal; Notable for the following components:   Hemoglobin 8.3 (*)    HCT 28.6 (*)    MCV 71.9 (*)    MCH 20.9 (*)    MCHC 29.0 (*)    RDW 33.8 (*)    Platelets 720 (*)    All other components within normal limits  BRAIN NATRIURETIC PEPTIDE - Abnormal; Notable for the  following components:   B Natriuretic Peptide 2,098.5 (*)    All other components within normal limits  TROPONIN I (HIGH SENSITIVITY) - Abnormal; Notable for the following components:   Troponin I (High Sensitivity) 32 (*)    All other components within normal limits  TROPONIN I (HIGH SENSITIVITY) - Abnormal; Notable  for the following components:   Troponin I (High Sensitivity) 36 (*)    All other components within normal limits  MAGNESIUM  I-STAT BETA HCG BLOOD, ED ([facility], [facility], [facility] ONLY)    EKG EKG Interpretation  Date/Time:  Thursday February 01 2021 09:55:53 EST Ventricular Rate:  97 PR Interval:    QRS Duration: 92 QT Interval:  367 QTC Calculation: 467 R Axis:   117 Text Interpretation: Sinus rhythm Probable left atrial enlargement Probable lateral infarct, age indeterminate Anterior infarct, old No STEMI Confirmed by Octaviano Glow [phone]) on 02/01/2021 10:04:38 AM   Radiology DG Chest Portable 1 View  Result Date: 02/01/2021 CLINICAL DATA:  Supraventricular tachycardia EXAM: PORTABLE CHEST 1 VIEW COMPARISON:  01/22/2021 FINDINGS: Unchanged cardiomegaly. No significant pulmonary vascular congestion. Lungs are clear. IMPRESSION: Unchanged cardiomegaly. Electronically Signed   By: Miachel Roux M.D.   On: 02/01/2021 09:28    Procedures Procedures   Medications Ordered in ED Medications  adenosine (ADENOCARD) 6 MG/2ML injection 6 mg (0 mg Intravenous Hold 02/01/21 1039)  metoprolol succinate (TOPROL-XL) 24 hr tablet 200 mg (200 mg Oral Given 02/01/21 1532)  amiodarone (PACERONE) tablet 200 mg (200 mg Oral Given 02/01/21 1532)  ondansetron (ZOFRAN) injection 4 mg (4 mg Intravenous Given 02/01/21 1007)    ED Course  I have reviewed the triage vital signs and the nursing notes.  Pertinent labs & imaging results that were available during my care of the patient were reviewed by me and considered in my medical decision making (see chart for details).  Clinical Course as of  02/01/21 1553  Thu Feb 01, 2021  0855 BP(!): 140/110 [CA]  0855 Pulse Rate(!): 159 [CA]  0920 Discussed case with Dr. Gardiner Rhyme with cardiology who reviewed patient's chart. Recommends giving adenosine. Cardiology will see. Most likely require admission since she is not currently taking any medications for her chronic conditions.  [CA]  [id number] Repeat EKG demonstrates HR at 97. No longer in SVT. Will hold adenosine right now. [CA]  33 45 yo female w/ complicated cardiac hx including AVNRT presenting to ED with SVT rhythm, spontaneously reverted into NSR with HR 90's.  Pt otherwise comfortable on exam.  Unfortunately after her last hospital discharge 1 week ago she was unable to fill her medications and so has not been taking these at home.  She is on several cardiac medications.  We've asked cardiology to evaluate the patient given her complicated history and offer recommendations regarding medications or need for further observation during this visit.   [MT]    Clinical Course User Index [CA] Suzy Bouchard, PA-C [MT] Wyvonnia Dusky, MD   MDM Rules/Calculators/A&P                         45 year old female presents to the ED due to palpitations associated with shortness of breath, chest pain, nausea, vomiting.  Patient recently admitted to the hospital on 1/19-2/5 for SVT, CHF, AKI, HTN emergency and was discharged with medications; however, she notes she has not taken any medications due to financial restraints. Upon arrival, patient found to be in SVT with HR in 150s. BP elevated at 140/110. Discussed case with cardiology see note above. Patient does not appear fluid overloaded on exam; however will obtain BNP due to history and medication noncomplaince. Routine labs, troponin, CXR and EKG ordered.  Chest x-ray personally reviewed which demonstrates stable cardiomegaly.  No signs of pneumonia or pneumothorax.  Initial EKG personally reviewed which  demonstrated SVT with no signs of acute  ischemia.  Initial troponin elevated at 32 likely due to demand ischemia.  CBC significant for anemia with hemoglobin 8.3 which appears better than patient's baseline.  Thrombocytosis at 720.  BMP significant for elevated creatinine at 1.44 and BUN at 30.  Normal magnesium.  BNP elevated at 2000. CXR negative for any signs of pulmonary edema. Patient is not fluid overloaded on exam. Will hold lasix at this time and wait for cardiology recommendations. Patient remains in NS. Delta trop flat. Suspect elevation related to demand ischemia.   Dr. Percival Spanish evaluated patient at bedside who recommends overnight admission. Cardiology will admit. Patient COVID + on 01/10/2021. No tested needed Final Clinical Impression(s) / ED Diagnoses Final diagnoses:  SVT (supraventricular tachycardia) [facility])    Rx / DC Orders ED Discharge Orders    None       Karie Kirks 02/01/21 1554    Wyvonnia Dusky, MD 02/01/21 [phone]

## 2021-02-01 NOTE — Progress Notes (Signed)
Heart Failure Patient Advocate Encounter   Received notification from [facility] that prior authorization for BiDil is required.   PA submitted on CoverMyMeds Key [id number] Status is pending   Will continue to follow.  Kerby Nora, PharmD, BCPS Heart Failure Stewardship Pharmacist Phone [phone]  Please check [email or web address] for unit-specific pharmacist phone numbers

## 2021-02-01 NOTE — H&P (Addendum)
Cardiology History and Physical:   Patient ID: Holly Bradley MRN: [id number]; DOB: 1976-08-27  Admit date: 02/01/2021 Date of Consult: 02/01/2021  Primary Care Provider: Patient, No Pcp Per [facility] Cardiologist: Freada Bergeron, MD  [facility] Electrophysiologist:  Vickie Epley, MD    Patient Profile:   Holly Bradley is a 45 y.o. female with a hx of chronic combined heart failure, HTN, moderate pericardial effusion, paroxysmal SVT, anemia, AKI, vascular demential with paranoid psychosis, and uterine fibroids who is being seen today for the evaluation of SVT at the request of Dr. Langston Masker.  History of Present Illness:   Holly Bradley with the above PMH presented to [facility] with another occurrence of SVT.   She was recently admitted with acute on chronic systolic and diastolic heart failure, recurrent SVT, NSVT, pericardial effusion, iron deficiency anemia, and acute renal failure. SVT responded to adenosine.  EP was consulted and confirmed SVT with 2 different RP intervals. PVC burden was 3-5%, amiodarone was continued. She was not a candidate for ablation. Cardiomyopathy worsened compared to ech on 04/2020 with EF now 20-25%. She was not a candidate for invasive ischemic evaluation. Etiology of worsening CM unclear, but uncontrolled HTN likely contributory. Echo also showed unchanged moderate pericardial effusion. She was diuresed and discharged on metoprolol an dbidil. Initial plan was to discharge to psychiatric unit; however, she was cleared to discharge home.   She was seen by Internal medicine yesterday with shortness of breath, nausea, and vomiting with poor PO intake. I do not see an EKG, but BMP was collected.   She presented today for similar problems and tachycardia. On presentation she states she felt like she went into SVT at 0500 this morning with HR 180s with concomitant dyspnea, CP, nausea, and vomiting. She was in SVT HR 150s with BP 140/110. She  spontaneously converted to sinus rhythm without adenosine or vagal maneuvers. She reported financial constraints and did not pick up any medications after her recent discharge.   Given her noncompliance and complex cardiac problems, cardiology was consulted.   BNP 2100 Mg 2.0 sCr 1.44 (1.26), K 4.7 HST 32 --> 36 Hb 7.7 - stable baseline TSH 1.953  I consulted PharmD who helped arrange her medications during he last admission. She picked up lasix from [place] and her brother picked up torpol and amiodarone from [facility]. Bidil was never picked up.   During my interview, she describes not being able to "access" or "utilize" her medications. Upon further questioning, I believe that she could not get the safety lids off of the bottles. She took one lasix yesterday due to heart racing. She picked up amiodarone and toprol from [facility] but was unable to open the bottles. She did not pick up bidil.   Will admit overnight for observation and to manage her issues regarding access to medications.   Past Medical History:  Diagnosis Date  . Chronic blood loss anemia    Related to uterine fibroids  . Essential hypertension    Poorly controlled, repeat by report only on nicardipine 90 mg  . Fibroid    Likely complicated by by menometrorrhagia and chronic anemia  . Paroxysmal SVT (supraventricular tachycardia) (HCC)    Frequent episodes, can last anywhere from 20 minutes to 12-15 hours.  Not on beta-blocker or calcium channel blocker    Past Surgical History:  Procedure Laterality Date  . Unknown       Home Medications:  Prior to Admission medications   Medication Sig Start Date  End Date Taking? Authorizing Provider  amiodarone (PACERONE) 200 MG tablet Take 1 tablet (200 mg total) by mouth daily. 01/30/21 03/01/21  Jean Rosenthal, MD  ARIPiprazole (ABILIFY) 2 MG tablet Take 1 tablet (2 mg total) by mouth at bedtime. 01/27/21 02/26/21  Sanjuan Dame, MD  furosemide (LASIX) 20 MG tablet Take 1 tablet (20  mg total) by mouth every Monday, Wednesday, and Friday for 12 doses. 01/29/21 02/24/21  Sanjuan Dame, MD  isosorbide-hydrALAZINE (BIDIL) 20-37.5 MG tablet Take 2 tablets by mouth 3 (three) times daily. 01/30/21 03/01/21  Jean Rosenthal, MD  metoprolol (TOPROL-XL) 200 MG 24 hr tablet Take 1 tablet (200 mg total) by mouth daily. Take with or immediately following a meal. 01/30/21 03/01/21  Jean Rosenthal, MD  Multiple Vitamin (MULTIVITAMIN WITH MINERALS) TABS tablet Take 1 tablet by mouth daily. 01/28/21 02/27/21  Sanjuan Dame, MD    Inpatient Medications: Scheduled Meds: . adenosine (ADENOCARD) IV  6 mg Intravenous Once  . amiodarone  200 mg Oral Daily  . metoprolol succinate  200 mg Oral Daily   Continuous Infusions:  PRN Meds:   Allergies:   No Known Allergies  Social History:   Social History   Socioeconomic History  . Marital status: Single    Spouse name: Not on file  . Number of children: Not on file  . Years of education: Not on file  . Highest education level: Not on file  Occupational History  . Not on file  Tobacco Use  . Smoking status: Not on file  . Smokeless tobacco: Not on file  Substance and Sexual Activity  . Alcohol use: Not on file  . Drug use: Not on file  . Sexual activity: Not on file  Other Topics Concern  . Not on file  Social History Narrative  . Not on file   Social Determinants of Health   Financial Resource Strain: Not on file  Food Insecurity: Not on file  Transportation Needs: Not on file  Physical Activity: Not on file  Stress: Not on file  Social Connections: Not on file  Intimate Partner Violence: Not on file    Family History:    Family History  Family history unknown: Yes     ROS:  Please see the history of present illness.   All other ROS reviewed and negative.     Physical Exam/Data:   Vitals:   02/01/21 1440 02/01/21 1441 02/01/21 1448 02/01/21 1532  BP:   (!) 170/100 (!) 179/109  Pulse: 95 92 89 96  Resp: (!) 24 17  16    Temp:      SpO2: 100% 100% 100%    No intake or output data in the 24 hours ending 02/01/21 1605 Last 3 Weights 01/31/2021 01/27/2021 01/26/2021  Weight (lbs) 135 lb 14.4 oz 125 lb 10.6 oz 124 lb  Weight (kg) 61.644 kg 57 kg 56.246 kg     There is no height or weight on file to calculate BMI.  General:  Thin appearing female appears older than stated age 36: normal Neck: no JVD Vascular: No carotid bruits Cardiac:  normal S1, S2; RRR; no murmur  Lungs:  clear to auscultation bilaterally, no wheezing, rhonchi or rales  Abd: soft, nontender, no hepatomegaly  Ext: no edema Musculoskeletal:  No deformities, BUE and BLE strength normal and equal Skin: warm and dry  Neuro:  CNs 2-12 intact, no focal abnormalities noted Psych:  Normal affect   EKG:  The EKG was personally reviewed  and demonstrates:  SVT HR 157, now sinus rhythm HR 97 Telemetry:  Telemetry was personally reviewed and demonstrates:  Sinus rhythm in the 90s  Relevant CV Studies:  ECHO 01/11/2021 1. Left ventricular ejection fraction, by estimation, is 20 to 25%. The  left ventricle has severely decreased function. The left ventricle  demonstrates global hypokinesis. The left ventricular internal cavity size  was mildly to moderately dilated. There  is moderate concentric left ventricular hypertrophy. Left ventricular  diastolic parameters are consistent with Grade II diastolic dysfunction  (pseudonormalization). Elevated left ventricular end-diastolic pressure.  The average left ventricular global  longitudinal strain is -5.8 %. The global longitudinal strain is abnormal.  2. Right ventricular systolic function is moderately reduced. The right  ventricular size is normal. There is moderately elevated pulmonary artery  systolic pressure.  3. Left atrial size was mildly dilated.  4. Right atrial size was mildly dilated.  5. Moderate pericardial effusion. The pericardial effusion is  circumferential. There is  no evidence of cardiac tamponade.  6. The mitral valve is normal in structure. Mild mitral valve  regurgitation. No evidence of mitral stenosis.  7. The aortic valve is tricuspid. Aortic valve regurgitation is not  visualized. No aortic stenosis is present.  8. The inferior vena cava is dilated in size with <50% respiratory  variability, suggesting right atrial pressure of 15 mmHg.    ECHO 05/02/2020  1. Left ventricular ejection fraction, by estimation, is 40 to 45%. The  left ventricle has mildly decreased function. The left ventricle  demonstrates global hypokinesis. There is mild concentric left ventricular  hypertrophy. Left ventricular diastolic  parameters are consistent with Grade II diastolic dysfunction  (pseudonormalization). Elevated left atrial pressure.  2. Right ventricular systolic function is low normal. The right  ventricular size is normal. There is moderately elevated pulmonary artery  systolic pressure.  3. Left atrial size was mildly dilated.  4. Right atrial size was mildly dilated.  5. Moderate pericardial effusion. The pericardial effusion is  circumferential. There is no evidence of cardiac tamponade.  6. The mitral valve is normal in structure. No evidence of mitral valve  regurgitation.  7. Tricuspid valve regurgitation is mild to moderate.  8. The aortic valve is normal in structure. Aortic valve regurgitation is  not visualized.  9. The inferior vena cava is dilated in size with <50% respiratory  variability, suggesting right atrial pressure of 15 mmHg  Laboratory Data:  High Sensitivity Troponin:   Recent Labs  Lab 01/10/21 1449 01/10/21 1600 01/10/21 1927 02/01/21 0859 02/01/21 1051  TROPONINIHS 802* 922* 888* 32* 36*     Chemistry Recent Labs  Lab 01/27/21 0239 01/31/21 0935 02/01/21 0859  NA 133* 139 137  K 4.1 4.3 4.7  CL 94* 100 99  CO2 25 26 25   GLUCOSE 98 76 97  BUN 41* 25* 30*  CREATININE 1.69* 1.26* 1.44*  CALCIUM  9.1 9.9 9.8  GFRNONAA 37* 53* 45*  ANIONGAP 14 13 13     Recent Labs  Lab 01/26/21 0439 01/27/21 0239 01/31/21 0935  PROT  --   --  7.6  ALBUMIN 2.5* 2.4* 2.9*  AST  --   --  21  ALT  --   --  21  ALKPHOS  --   --  113  BILITOT  --   --  0.6   Hematology Recent Labs  Lab 01/27/21 0239 01/31/21 0935 02/01/21 0859  WBC 9.2 10.7* 10.4  RBC 4.01 3.91 3.98  HGB 7.6*  7.7* 8.3*  HCT 27.3* 28.3* 28.6*  MCV 68.1* 72.4* 71.9*  MCH 19.0* 19.7* 20.9*  MCHC 27.8* 27.2* 29.0*  RDW 29.8* 32.8* 33.8*  PLT 582* 778* 720*   BNP Recent Labs  Lab 02/01/21 0906  BNP 2,098.5*    DDimer No results for input(s): DDIMER in the last 168 hours.   Radiology/Studies:  DG Chest Portable 1 View  Result Date: 02/01/2021 CLINICAL DATA:  Supraventricular tachycardia EXAM: PORTABLE CHEST 1 VIEW COMPARISON:  01/22/2021 FINDINGS: Unchanged cardiomegaly. No significant pulmonary vascular congestion. Lungs are clear. IMPRESSION: Unchanged cardiomegaly. Electronically Signed   By: Miachel Roux M.D.   On: 02/01/2021 09:28     Assessment and Plan:   SVT Hx of AVNRT - she has not been taking amiodarone or toprol - restart  200 mg amiodarone  - restart 200 mg toprol    Pericardial effusion - stable on last echo   Hypertension Hypertensive urgency on arrival - SBP 160-170s - has not taken bidil or toprol since discharge - will restart bidil - CHF pharmD working on pharmD - renal function may be able to tolerate an ARB at this point, but I am concerned about noncompliance and not coming for follow up labs - if compliance is proven, may be able to consider this in the future - TID medication is not ideal for her    Acute on chronic systolic and diastolic heart failure Moderately elevated PA pressure Mild MR - echo with EF 40-45% -->  20-25%, grade 2 DD - has been taking lasix, no bidil or BB - BNP 2000 - CXR negative for pulmonary vascular congestion - home dose of lasix was 20 mg three  times weekly - she has taken one pill since discharge - question if volume management precipitated her SVT occurrence - will increase lasix to 20 mg daily   AKI - sCr 1.44 - appears to have been closer to 1.05-1.1 01/16/21   Anemia - Hb 8.3 - at her baseline   Thrombophilia - PLT 720   Vascular dementia Paranoid psychosis - seroquel causes tachcyardia - she is unable to afford abilify - will likely need to consult teaching service to help manage these medications    Risk Assessment/Risk Scores:      For questions or updates, please contact [facility] Please consult [email or web address] for contact info under    Signed, Ledora Bottcher, PA  02/01/2021 4:05 PM  History and all data above reviewed.  Patient examined.  This is an unfortunate social situation.  The patient has SVT, HTN not controlled and cardiomyopathy apparently as a result of this.  She presented with tachycardia and SOB associated with this and was found to be in SVT.  This did convert spontaneously.  She was discharged from the hospital.  She lives with her brother but it does not seem that she takes her meds.  She has previously not been adherent with appts and meds.  She has failed to present for EP evaluation so really is not an ablation candidate.  I agree with the findings as above.  The patient exam reveals COR:RRR  ,  Lungs: Clear  ,  Abd: Positive bowel sounds, no rebound no guarding , Ext No edema  .  All available labs, radiology testing, previous records reviewed. Agree with documented assessment and plan. SVT:  I think this could be controlled if we could get her on her meds.  We discussed this with her at length.  We will try  to arrange all of her meds and I am going to advocate for home delivery with possibly a blister pack.  We will give Lasix today as her BNP is elevated.  BP is not controlled I think only because she did not take her meds.  We will resume previous meds and make sure she has pre  approval for her BiDil.    Jeneen Rinks Arik Husmann  4:46 PM  02/01/2021

## 2021-02-01 NOTE — Assessment & Plan Note (Addendum)
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 She received iron transfusion x 1. Hb stable at 7 today. Unchanged from recent lab from last hospitalization. Will need another Ferra hem  transfusion. Sent a message to [facility] team to arrange that.  -Continue ferrous sulfate -Will need f/u with OBGYN for fibroma (no bleeding currently)

## 2021-02-02 ENCOUNTER — Other Ambulatory Visit: Payer: Self-pay

## 2021-02-02 DIAGNOSIS — I160 Hypertensive urgency: Secondary | ICD-10-CM | POA: Diagnosis not present

## 2021-02-02 DIAGNOSIS — I110 Hypertensive heart disease with heart failure: Secondary | ICD-10-CM

## 2021-02-02 DIAGNOSIS — I5042 Chronic combined systolic (congestive) and diastolic (congestive) heart failure: Secondary | ICD-10-CM

## 2021-02-02 DIAGNOSIS — I471 Supraventricular tachycardia: Secondary | ICD-10-CM | POA: Diagnosis not present

## 2021-02-02 DIAGNOSIS — Z5941 Food insecurity: Secondary | ICD-10-CM

## 2021-02-02 LAB — BASIC METABOLIC PANEL
Anion gap: 11 (ref 5–15)
BUN: 34 mg/dL — ABNORMAL HIGH (ref 6–20)
CO2: 24 mmol/L (ref 22–32)
Calcium: 9.5 mg/dL (ref 8.9–10.3)
Chloride: 101 mmol/L (ref 98–111)
Creatinine, Ser: 1.57 mg/dL — ABNORMAL HIGH (ref 0.44–1.00)
GFR, Estimated: 41 mL/min — ABNORMAL LOW (ref >60)
Glucose, Bld: 87 mg/dL (ref 70–99)
Potassium: 4.6 mmol/L (ref 3.5–5.1)
Sodium: 136 mmol/L (ref 135–145)

## 2021-02-02 LAB — CBC
HCT: 26.1 % — ABNORMAL LOW (ref 36.0–46.0)
Hemoglobin: 7.5 g/dL — ABNORMAL LOW (ref 12.0–15.0)
MCH: 20.9 pg — ABNORMAL LOW (ref 26.0–34.0)
MCHC: 28.7 g/dL — ABNORMAL LOW (ref 30.0–36.0)
MCV: 72.7 fL — ABNORMAL LOW (ref 80.0–100.0)
Platelets: 684 10*3/uL — ABNORMAL HIGH (ref 150–400)
RBC: 3.59 MIL/uL — ABNORMAL LOW (ref 3.87–5.11)
RDW: 34.3 % — ABNORMAL HIGH (ref 11.5–15.5)
WBC: 9.5 10*3/uL (ref 4.0–10.5)
nRBC: 0 % (ref 0.0–0.2)

## 2021-02-02 MED ORDER — HYDRALAZINE HCL 50 MG PO TABS: 50.0000 mg | ORAL_TABLET | Freq: Three times a day (TID) | ORAL | 11 refills | Status: DC

## 2021-02-02 MED ORDER — AMIODARONE HCL 200 MG PO TABS: 200.0000 mg | ORAL_TABLET | Freq: Every day | ORAL | 11 refills | Status: DC

## 2021-02-02 MED ORDER — ISOSORBIDE MONONITRATE ER 60 MG PO TB24: 60.0000 mg | ORAL_TABLET | Freq: Every day | ORAL | 11 refills | Status: DC

## 2021-02-02 MED ORDER — METOPROLOL SUCCINATE ER 200 MG PO TB24: 200.0000 mg | ORAL_TABLET | Freq: Every day | ORAL | 11 refills | Status: DC

## 2021-02-02 MED ORDER — ARIPIPRAZOLE 2 MG PO TABS: 2.0000 mg | ORAL_TABLET | Freq: Every day | ORAL | 11 refills | Status: DC

## 2021-02-02 MED ORDER — FUROSEMIDE 20 MG PO TABS: 20.0000 mg | ORAL_TABLET | Freq: Every day | ORAL | 11 refills | Status: DC

## 2021-02-02 NOTE — Progress Notes (Signed)
[facility]  PCP: No PCP Primary Cardiologist:Pemberton, Heather  HPI:  Holly Bradley is a 45 y.o.  female  with a PMH significant for AVNRT, Chronic combined Systolic and Diastolic CHF Biventricular CHF, Uterine Fibroids, Iron deficiency anemia with Menometrorrhagia,  Psychosis.  The majority of patient's prior medical history centers around severe uterine fibroids and bleeding, also poorly controlled HTN and tobacco use. Referred to IR for embolization procedure to improve fibroid bleeding 2019 most visits that I can see she has been severely hypertensive.    Admitted in May 2021 with SVT which apparently had been diagnosed before in 2019 according to the patient. Her SVT occurred in the setting of a hgb of 4.4 due to ongoing uterine bleeding.  Additionally she was found to be in low EF CHF which was thought to be secondary to Svt and marked anemia as well as poorly controlled HTN.  She was given adenosine and PRBCs she converted to NSR, was placed on a beta blocker, received a minimal amount of IV lasix and was discharged with outpatient follow up.  Additionally the added an ACEI for bp and restarted her nifedipine.   Recently Admitted in January for similar presentation of dyspnea, palpitation in SVT, which again terminated with adenosine.  Iron deficiency anemia admission hgb 9.5.  She was also found to be hypertensive and have an acute kidney injury. She came in on lisinopril 21m, carvedilol 12.556mBID, and lasix 4015maily which were held for AKI. She was switched to toprol xl and this was uptitrated during her stay.  She initially responded to IV lasix with improvement in renal function was transitioned to oral lasix but renal function worsened so this was held. Additionally she was noted to have episodes of NSVT during admission.  She was started on nitrate/hydral, toprol x-L, for bp control, also started on amiodarone for the SVT.    ED visit on 2/10 admitted for one day by  cardiology for another episode of SVT, HTN.  She spontaneously converted to NSR.  She was discharged with increased dosage of nitrate hydral imdur 60 and hydral 50 TID.     Feeling better overall since ED visit.  Having infrequent palpitations, still having some dyspnea as well.  Can walk for around 5 minutes before getting short of breath.  She has to pause when going upstairs due to dyspnea.  No chest pain.  Reports 2 pillow orthopnea.  PND episode last night.  She took her morning pills at around 8am she brought in her pill pack and we reconciled her medications which were appropriate.  Has not been weighing herself at home possibly has already packed her scale as they are moving.  She will move from her apartment to a new home in around 2 weeks.  Has not checked bp at home she does not have a bp cuff.  She quit smoking in January, she smoked for about 10 years prior to this about 1/2 ppd.  Denies any family history of cardiac disease.  Mother died from end stage alzheimer's around age 45.1   ROS: All systems negative except as listed in HPI, PMH and Problem List.  SH:  Social History   Socioeconomic History  . Marital status: Single    Spouse name: Not on file  . Number of children: Not on file  . Years of education: Not on file  . Highest education level: Not on file  Occupational History  . Not on file  Tobacco Use  . Smoking status: Current Some Day Smoker  . Smokeless tobacco: Current User  Substance and Sexual Activity  . Alcohol use: Never  . Drug use: Not Currently    Types: Marijuana  . Sexual activity: Not on file  Other Topics Concern  . Not on file  Social History Narrative  . Not on file   Social Determinants of Health   Financial Resource Strain: Medium Risk  . Difficulty of Paying Living Expenses: Somewhat hard  Food Insecurity: No Food Insecurity  . Worried About Charity fundraiser in the Last Year: Never true  . Ran Out of Food in the Last Year: Never true   Transportation Needs: Not on file  Physical Activity: Not on file  Stress: Not on file  Social Connections: Not on file  Intimate Partner Violence: Not on file    FH:  Family History  Family history unknown: Yes    Past Medical History:  Diagnosis Date  . Chronic blood loss anemia    Related to uterine fibroids  . Essential hypertension    Poorly controlled, repeat by report only on nicardipine 90 mg  . Fibroid    Likely complicated by by menometrorrhagia and chronic anemia  . Paroxysmal SVT (supraventricular tachycardia) (HCC)    Frequent episodes, can last anywhere from 20 minutes to 12-15 hours.  Not on beta-blocker or calcium channel blocker    Current Outpatient Medications  Medication Sig Dispense Refill  . amiodarone (PACERONE) 200 MG tablet Take 1 tablet (200 mg total) by mouth daily. 30 tablet 11  . ARIPiprazole (ABILIFY) 2 MG tablet Take 1 tablet (2 mg total) by mouth at bedtime. 30 tablet 11  . Ensure (ENSURE) Take 237 mLs by mouth See admin instructions. Drink 237 ml's (Vanilla) by mouth every morning and at bedtime    . hydrALAZINE (APRESOLINE) 50 MG tablet Take 1 tablet (50 mg total) by mouth 3 (three) times daily. 90 tablet 11  . isosorbide mononitrate (IMDUR) 60 MG 24 hr tablet Take 1 tablet (60 mg total) by mouth daily. 30 tablet 11  . metoprolol (TOPROL-XL) 200 MG 24 hr tablet Take 1 tablet (200 mg total) by mouth daily. Take with or immediately following a meal. 30 tablet 11   No current facility-administered medications for this encounter.    Vitals:   02/05/21 1112  BP: (!) 166/74  Pulse: 74  SpO2: 100%  Weight: 64.9 kg (143 lb)  Height: 5' 5.5" (1.664 m)    PHYSICAL EXAM: Cardiac: JVD elevated, normal rate and rhythm, clear s1 and s2, 1/6 systolic murmur, no rubs or gallops, 2+ LE edema bilaterally Pulmonary: diminished sounds in R lung base, not in distress Abdominal: non distended abdomen, soft and nontender Psych: Alert, conversant, has a  child-like disposition    ECG   NSR, Biatrial enlargement, RAD  ASSESSMENT & PLAN:  Acute on Chronic Combined Systolic and Diastolic CHF, Biventricular CHF:  -EF down to 20-25% 2022 compared with 40-45% in 2019 -Stable Moderate pericardial effusion  -Long standing poorly controlled HTN, Atherosclerosis, SVT -Concern for amyloid given age and degree of hypertrophy on ECHO -Will need Cardiac MRI, Multiple Myeloma workup, based on initial results Nuclear PYP scan -Will need R/LHC, we will work to get bleeding situation under control -NYHA Class III symptoms -Difficulty taking medications appropriately will be important not to make too many changes too soon -Up around 8 kg compared with d/c weight on 2/5 appears to have extra volume today mostly  R sided fluid however some diminished breath sound in right lung base also complaining of orthopnea, PND -had good bp control before d/c from 1/19 hospitalization, I expect a lot of her bp elevation is from volume -start torsemide 77m daily, repeat bmp with close follow up and also arranged paramedicine to help with the management of the patient  Paroxysmal SVT, AVNRT: -Currently in NSR, seems to have n/v symptoms when in SVT, she does not come in every time she has  -we will continue amiodarone 2012m Toprol X-L -Needs EP follow up going forward  Iron Deficiency Anemia 2/2 Fibroid Uterus: -will need repeat CBC at next visit she started her menstrual cycle today -in the interim will need periodic infusions of Iron and/or blood -Desperately needs definitive management by OB/GYN [place] 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

## 2021-02-02 NOTE — Discharge Summary (Addendum)
Discharge Summary    Patient ID: Holly Bradley MRN: [id number]; DOB: 1976/09/04  Admit date: 02/01/2021 Discharge date: 02/02/2021  PCP:  Patient, No Pcp Per   [facility]  Cardiologist:  Freada Bergeron, MD  Advanced Practice Provider:  No care team member to display Electrophysiologist:  Vickie Epley, MD  [phone]    Discharge Diagnoses    Principal Problem:   SVT (supraventricular tachycardia) ([place]) Active Problems:   CHF (congestive heart failure) ([place])   Acute renal failure ([place])   Anemia   Pelvic mass in female   Fibroid   Food insecurity   Diagnostic Studies/Procedures    ECHO 01/11/2021 1. Left ventricular ejection fraction, by estimation, is 20 to 25%. The  left ventricle has severely decreased function. The left ventricle  demonstrates global hypokinesis. The left ventricular internal cavity size  was mildly to moderately dilated. There  is moderate concentric left ventricular hypertrophy. Left ventricular  diastolic parameters are consistent with Grade II diastolic dysfunction  (pseudonormalization). Elevated left ventricular end-diastolic pressure.  The average left ventricular global  longitudinal strain is -5.8 %. The global longitudinal strain is abnormal.  2. Right ventricular systolic function is moderately reduced. The right  ventricular size is normal. There is moderately elevated pulmonary artery  systolic pressure.  3. Left atrial size was mildly dilated.  4. Right atrial size was mildly dilated.  5. Moderate pericardial effusion. The pericardial effusion is  circumferential. There is no evidence of cardiac tamponade.  6. The mitral valve is normal in structure. Mild mitral valve  regurgitation. No evidence of mitral stenosis.  7. The aortic valve is tricuspid. Aortic valve regurgitation is not  visualized. No aortic stenosis is present.  8. The inferior vena cava is dilated in size with <50%  respiratory  variability, suggesting right atrial pressure of 15 mmHg.    ECHO 05/02/2020  1. Left ventricular ejection fraction, by estimation, is 40 to 45%. The  left ventricle has mildly decreased function. The left ventricle  demonstrates global hypokinesis. There is mild concentric left ventricular  hypertrophy. Left ventricular diastolic  parameters are consistent with Grade II diastolic dysfunction  (pseudonormalization). Elevated left atrial pressure.  2. Right ventricular systolic function is low normal. The right  ventricular size is normal. There is moderately elevated pulmonary artery  systolic pressure.  3. Left atrial size was mildly dilated.  4. Right atrial size was mildly dilated.  5. Moderate pericardial effusion. The pericardial effusion is  circumferential. There is no evidence of cardiac tamponade.  6. The mitral valve is normal in structure. No evidence of mitral valve  regurgitation.  7. Tricuspid valve regurgitation is mild to moderate.  8. The aortic valve is normal in structure. Aortic valve regurgitation is  not visualized.  9. The inferior vena cava is dilated in size with <50% respiratory  variability, suggesting right atrial pressure of 15 mmHg _____________   History of Present Illness     Holly Bradley is a 45 y.o. female with a hx of chronic combined heart failure, HTN, moderate pericardial effusion, paroxysmal SVT, anemia, AKI, vascular demential with paranoid psychosis, and uterine fibroids who is being seen today for the evaluation of SVT. Social issues include limited income on out-of-state medicaid and food insecurities. Noncompliance complicated by inability to open pill bottles. Lives with brother currently, but living situation is changing.   Holly Bradley with the above PMH presented to [facility] 02/01/21 with another occurrence of SVT.   She was  recently admitted with acute on chronic systolic and diastolic heart failure, recurrent SVT, NSVT,  pericardial effusion, iron deficiency anemia, and acute renal failure. SVT responded to adenosine.  EP was consulted and confirmed SVT with 2 different RP intervals. PVC burden was 3-5%, amiodarone was continued. She was not a candidate for ablation. Cardiomyopathy worsened compared to ech on 04/2020 with EF now 20-25%. She was not a candidate for invasive ischemic evaluation. Etiology of worsening CM unclear, but uncontrolled HTN likely contributory. Echo also showed unchanged moderate pericardial effusion. She was diuresed and discharged on metoprolol an dbidil. Initial plan was to discharge to psychiatric unit; however, she was cleared to discharge home.   She was seen by Internal medicine yesterday with shortness of breath, nausea, and vomiting with poor PO intake. I do not see an EKG, but BMP was collected.   She presented today for similar problems and tachycardia. On presentation she states she felt like she went into SVT at 0500 this morning with HR 180s with concomitant dyspnea, CP, nausea, and vomiting. She was in SVT HR 150s with BP 140/110. She spontaneously converted to sinus rhythm without adenosine or vagal maneuvers. She reported financial constraints and did not pick up any medications after her recent discharge.   Given her noncompliance and complex cardiac problems, cardiology was consulted.   BNP 2100 Mg 2.0 sCr 1.44 (1.26), K 4.7 HST 32 --> 36 Hb 7.7 - stable baseline TSH 1.953  I consulted PharmD who helped arrange her medications during he last admission. She picked up lasix from [facility] and her brother picked up torpol and amiodarone from [facility]. Bidil was never picked up.   During my interview, she describes not being able to "access" or "utilize" her medications. Upon further questioning, I believe that she could not get the safety lids off of the bottles. She took one lasix yesterday due to heart racing. She picked up amiodarone and toprol from [facility] but was unable to  open the bottles. She did not pick up bidil.   We admitted her overnight for observation and to manage her issues regarding access to medications.  Hospital Course     Consultants: SW  Paroxysmal SVT Likely related to noncompliance with medications. She has not taken amiodarone, toprol, or bidil since discharge over the weekend. She took one lasix the day prior to presentation. She self-converted in the ER to sinus rhythm without adenosine or vagal maneuvers. Continue 200 mg amiodarone and 200 mg toprol daily.   Pericardial effusion Stable on echo.   Acute on chronic systolic and diastolic heart failure Moderately elevated PA pressure Mild MR Echo with EF 40-45% --> now 20-25% with grade 2 DD. BNP > 2000, but did not appear significantly volume up on exam. CXR negative for pulmonary vascular congestion. Prior discharge with 20 mg lasix MWF due to concern for over-diuresis. She only took one lasix since discharge. Due to noncompliance and renal insufficiency, we did not start ACEI/ARB/ARNI. Renal function may tolerate one of these, but I worry about her presenting for follow up labs. Bidil is too expensive. Converted to 50 mg hydralazine TID and 60 mg imdur daily. Lasix was D/C'ed prior to discharge.    Hypertensive urgency Hypertension Medications as above.    AKI sCr 1.26 --> 1.44 --> 1.56 --> 1.57 Recommend BMP early next week.    Barriers to care - transportation needs - getting set up with [facility] - have sent medications to [facility] for blister packs - cost of  medications - out-of-state medicaid Working closely with CHF TOC team, including SW and PharmD   She will follow closely with CHF clinic, beginning with [facility], and with Dr. Johney Frame.     Did the patient have an acute coronary syndrome (MI, NSTEMI, STEMI, etc) this admission?:  No                               Did the patient have a percutaneous coronary intervention (stent / angioplasty)?:  No.        _____________  Discharge Vitals Blood pressure (!) 142/81, pulse 73, temperature 97.7 F (36.5 C), temperature source Oral, resp. rate 16, SpO2 96 %.  There were no vitals filed for this visit.  Labs & Radiologic Studies    CBC Recent Labs    01/31/21 0935 02/01/21 0859 02/01/21 1854 02/02/21 0316  WBC 10.7*   < > 12.1* 9.5  NEUTROABS 8.5*  --   --   --   HGB 7.7*   < > 7.3* 7.5*  HCT 28.3*   < > 25.6* 26.1*  MCV 72.4*   < > 72.5* 72.7*  PLT 778*   < > 673* 684*   < > = values in this interval not displayed.   Basic Metabolic Panel Recent Labs    02/01/21 0859 02/01/21 1854 02/02/21 0316  NA 137  --  136  K 4.7  --  4.6  CL 99  --  101  CO2 25  --  24  GLUCOSE 97  --  87  BUN 30*  --  34*  CREATININE 1.44* 1.56* 1.57*  CALCIUM 9.8  --  9.5  MG 2.0  --   --    Liver Function Tests Recent Labs    01/31/21 0935  AST 21  ALT 21  ALKPHOS 113  BILITOT 0.6  PROT 7.6  ALBUMIN 2.9*   No results for input(s): LIPASE, AMYLASE in the last 72 hours. High Sensitivity Troponin:   Recent Labs  Lab 01/10/21 1449 01/10/21 1600 01/10/21 1927 02/01/21 0859 02/01/21 1051  TROPONINIHS 802* 922* 888* 32* 36*    BNP Invalid input(s): POCBNP D-Dimer No results for input(s): DDIMER in the last 72 hours. Hemoglobin A1C No results for input(s): HGBA1C in the last 72 hours. Fasting Lipid Panel No results for input(s): CHOL, HDL, LDLCALC, TRIG, CHOLHDL, LDLDIRECT in the last 72 hours. Thyroid Function Tests No results for input(s): TSH, T4TOTAL, T3FREE, THYROIDAB in the last 72 hours.  Invalid input(s): FREET3 _____________  CT ABDOMEN PELVIS WO CONTRAST  Result Date: 01/11/2021 CLINICAL DATA:  Abdominal distension.  COVID positive. EXAM: CT ABDOMEN AND PELVIS WITHOUT CONTRAST TECHNIQUE: Multidetector CT imaging of the abdomen and pelvis was performed following the standard protocol without IV contrast. COMPARISON:  None. FINDINGS: Lower chest: There is  consolidation versus atelectasis at the left lung base.There is massive cardiomegaly. There is a small to moderate-sized pericardial effusion. The intracardiac blood pool is hypodense relative to the adjacent myocardium consistent with anemia. Hepatobiliary: The liver appears enlarged but is not well evaluated secondary to the lack of IV contrast and significant respiratory motion artifact. The gallbladder was not well evaluated.The biliary tree is not well evaluated. Pancreas: The pancreas is not well evaluated. Spleen: Unremarkable. Adrenals/Urinary Tract: --Adrenal glands: The adrenal glands are not well evaluated. --Right kidney/ureter: There appears to be at least mild right-sided hydronephrosis. --Left kidney/ureter: There appears to be mild left-sided hydronephrosis.  The left kidney appears atrophic. --Urinary bladder: The urinary bladder is displaced to the right. Stomach/Bowel: --Stomach/Duodenum: No hiatal hernia or other gastric abnormality. Normal duodenal course and caliber. --Small bowel: Unremarkable. --Colon: The colon is suboptimally evaluated. There does not appear to be colonic distension. --Appendix: The appendix cannot be reliably identified. Vascular/Lymphatic: Atherosclerotic calcification is present within the non-aneurysmal abdominal aorta, without hemodynamically significant stenosis. --evaluation for adenopathy is limited. Reproductive: There is a very large pelvic mass measuring approximately 23 x 19 x 12 cm. Is difficult to determine the exact origin of this mass, however it is favored to be uterine in origin. Neither ovary is well identified on this study. Other: There is a small volume of free fluid the patient's pelvis and abdomen. The abdominal wall is normal. Musculoskeletal. No acute displaced fractures. IMPRESSION: 1. Exam is severely limited by extensive motion artifact and lack of IV contrast. 2. There is a very large 23 cm mass arising from the patient's pelvis. This is favored  to be uterine in origin, however evaluation is limited by lack of IV contrast. This may represent a large fibroid uterus. Neither ovary is well identified. Gynecologic consultation is recommended. 3. Small volume free fluid in the abdomen and pelvis. 4. Significant cardiomegaly with a small to moderate-sized pericardial effusion. 5. Anemia. 6.  Aortic Atherosclerosis (ICD10-I70.0). Electronically Signed   By: Constance Holster M.D.   On: 01/11/2021 01:05   CT HEAD WO CONTRAST  Result Date: 01/15/2021 CLINICAL DATA:  Inpatient. COVID positive. Mental status change with worsening confusion. No reported injury. EXAM: CT HEAD WITHOUT CONTRAST TECHNIQUE: Contiguous axial images were obtained from the base of the skull through the vertex without intravenous contrast. COMPARISON:  None. FINDINGS: Brain: Scan is limited by motion degradation. No evidence of parenchymal hemorrhage or extra-axial fluid collection. No mass lesion, mass effect, or midline shift. No CT evidence of acute infarction. Nonspecific moderate subcortical and periventricular white matter hypodensity. No ventriculomegaly. Vascular: No acute abnormality. Skull: No evidence of calvarial fracture. Sinuses/Orbits: The visualized paranasal sinuses are essentially clear. Other:  The mastoid air cells are unopacified. IMPRESSION: 1. Limited motion degraded scan. No evidence of acute intracranial abnormality. 2. Nonspecific moderate periventricular and subcortical white matter hypodensity in the cerebral white matter. While potentially representing age advanced chronic small vessel ischmic changes, other etiologies including a demyelinating disorder cannot be excluded given patient's young age. Consider brain MRI for further evaluation. Electronically Signed   By: Ilona Sorrel M.D.   On: 01/15/2021 16:29   MR BRAIN W WO CONTRAST  Result Date: 01/16/2021 CLINICAL DATA:  45 year old female with altered mental status, dizziness. Positive COVID-19. EXAM:  MRI HEAD WITHOUT AND WITH CONTRAST TECHNIQUE: Multiplanar, multiecho pulse sequences of the brain and surrounding structures were obtained without and with intravenous contrast. CONTRAST:  69mL GADAVIST GADOBUTROL 1 MMOL/ML IV SOLN COMPARISON:  Head CT 01/15/2021. FINDINGS: Study is mildly degraded by motion artifact despite repeated imaging attempts. Brain: There are 5 or 6 small curvilinear foci of acute restricted diffusion in the right frontal lobe white matter (such as series 5, image 84). A single focus involves the right parietal lobe white matter near the atrium of the lateral ventricle (image 78). Solitary similar focus in the right superior cerebellum (series 5, image 61). And additionally, there are a similar small number of subacute appearing white matter foci of restriction, such as left corona radiata series 5, image 82 and right occipital lobe image 70. Superimposed confluent bilateral cerebral white matter T2  and FLAIR hyperintensity with moderate to severe heterogeneity in the bilateral basal ganglia most compatible with chronic lacunar infarcts. Relative sparing of the thalamus, although multiple chronic pontine lacunar infarcts are noted greater on the right (series 6, image 16). SWI mildly degraded but a few scattered chronic microhemorrhages are suspected in the brain. No definite cortical encephalomalacia. No superimposed midline shift, mass effect, evidence of mass lesion, ventriculomegaly, extra-axial collection or acute intracranial hemorrhage. Cervicomedullary junction and pituitary are within normal limits. Intrinsic T1 hyperintensity on precontrast images suggesting recent iron administration. No definite abnormal enhancement or dural thickening. Vascular: Major intracranial vascular flow voids are preserved. Pre contrast intrinsic T1 hyperintensity of the major vascular structures such as with recent intravenous iron. Skull and upper cervical spine: Generalized dark T1 marrow signal such  as due to hemo siderosis. No suspicious marrow lesion. Sinuses/Orbits: Negative. Other: Mastoids are clear. Grossly negative face and scalp soft tissues. IMPRESSION: 1. 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 Similar small acute right superior cerebellar lacunar infarct. No associated acute hemorrhage or mass effect. 2. Intrinsic T1 signal prior to contrast suggesting recent intravenous iron administration. No definite abnormal enhancement. 3. Diffusely abnormal marrow signal suggesting hemosiderosis. Electronically Signed   By: Genevie Ann M.D.   On: 01/16/2021 05:43   US RENAL  Result Date: 01/24/2021 CLINICAL DATA:  Acute renal injury EXAM: RENAL / URINARY TRACT ULTRASOUND COMPLETE COMPARISON:  None. FINDINGS: Right Kidney: Renal measurements: 10 x 4 x 4.4 cm = volume: 90 mL. Echogenicity within normal limits. No mass or hydronephrosis visualized. Left Kidney: Renal measurements: 8.6 x 3.8 x 4.0 cm. = volume: 68 mL. Echogenicity within normal limits. No mass or hydronephrosis visualized. Bladder: Appears normal for degree of bladder distention. Other: None. IMPRESSION: No acute abnormality noted. Electronically Signed   By: Inez Catalina M.D.   On: 01/24/2021 23:19   DG Chest Portable 1 View  Result Date: 02/01/2021 CLINICAL DATA:  Supraventricular tachycardia EXAM: PORTABLE CHEST 1 VIEW COMPARISON:  01/22/2021 FINDINGS: Unchanged cardiomegaly. No significant pulmonary vascular congestion. Lungs are clear. IMPRESSION: Unchanged cardiomegaly. Electronically Signed   By: Miachel Roux M.D.   On: 02/01/2021 09:28   DG CHEST PORT 1 VIEW  Result Date: 01/22/2021 CLINICAL DATA:  Midline catheter placement EXAM: PORTABLE CHEST 1 VIEW COMPARISON:  01/10/2021 FINDINGS: Right upper extremity midline catheter has been placed with its tip overlying the expected brachial axillary junction. The left  hemithorax is partially excluded. Mild cardiomegaly is again noted. Right lung is clear. No pneumothorax. No pleural effusion on the right. Pulmonary vascularity is normal. IMPRESSION: Right upper extremity midline catheter tip at the brachial axillary junction. Electronically Signed   By: Fidela Salisbury MD   On: 01/22/2021 08:20   DG Chest Portable 1 View  Result Date: 01/10/2021 CLINICAL DATA:  Fever and weakness.  Dizziness EXAM: PORTABLE CHEST 1 VIEW COMPARISON:  None. FINDINGS: Cardiopericardial enlargement, chronic. Poor visualization of the retrocardiac lung likely related to heart size, also seen previously. No edema, effusion, or pneumothorax. IMPRESSION: Chronic Cardiopericardial enlargement.  No pulmonary edema. Electronically Signed   By: Monte Fantasia M.D.   On: 01/10/2021 11:43   ECHOCARDIOGRAM LIMITED  Result Date: 01/11/2021    ECHOCARDIOGRAM LIMITED REPORT   [name] Date of Exam: 01/11/2021 Medical Rec #:  [id number]        Height:  66.0 in Accession #:    [id number]       Weight:       142.1 lb Date of Birth:  12-Mar-1976        BSA:          1.729 m Patient Age:    41 years         BP:           158/120 mmHg Patient Gender: F                HR:           86 bpm. Exam Location:  Inpatient Procedure: 2D Echo, Cardiac Doppler, Color Doppler and Strain Analysis STAT ECHO Indications:    Congestive Heart Failure I50.9  History:        Patient has prior history of Echocardiogram examinations, most                 recent 05/02/2020. Risk Factors:Hypertension.  Sonographer:    Vickie Epley RDCS Referring Phys: [id number] JOSHUA ZAVITZ  Sonographer Comments: Covid positive. IMPRESSIONS  1. Left ventricular ejection fraction, by estimation, is 20 to 25%. The left ventricle has severely decreased function. The left ventricle demonstrates global hypokinesis. The left ventricular internal cavity size was mildly to moderately dilated. There  is moderate concentric left  ventricular hypertrophy. Left ventricular diastolic parameters are consistent with Grade II diastolic dysfunction (pseudonormalization). Elevated left ventricular end-diastolic pressure. The average left ventricular global longitudinal strain is -5.8 %. The global longitudinal strain is abnormal.  2. Right ventricular systolic function is moderately reduced. The right ventricular size is normal. There is moderately elevated pulmonary artery systolic pressure.  3. Left atrial size was mildly dilated.  4. Right atrial size was mildly dilated.  5. Moderate pericardial effusion. The pericardial effusion is circumferential. There is no evidence of cardiac tamponade.  6. The mitral valve is normal in structure. Mild mitral valve regurgitation. No evidence of mitral stenosis.  7. The aortic valve is tricuspid. Aortic valve regurgitation is not visualized. No aortic stenosis is present.  8. The inferior vena cava is dilated in size with <50% respiratory variability, suggesting right atrial pressure of 15 mmHg. Comparison(s): Changes from prior study are noted. Conclusion(s)/Recommendation(s): LV function decreased since prior study. Moderate pericardial effusion still present but no evidence of tamponade. Findings communicated with Dr. Johney Frame at 9:13 AM. FINDINGS  Left Ventricle: Left ventricular ejection fraction, by estimation, is 20 to 25%. The left ventricle has severely decreased function. The left ventricle demonstrates global hypokinesis. The average left ventricular global longitudinal strain is -5.8 %. The global longitudinal strain is abnormal. The left ventricular internal cavity size was mildly to moderately dilated. There is moderate concentric left ventricular hypertrophy. Left ventricular diastolic parameters are consistent with Grade II diastolic dysfunction (pseudonormalization). Elevated left ventricular end-diastolic pressure. Right Ventricle: The right ventricular size is normal. No increase in right  ventricular wall thickness. Right ventricular systolic function is moderately reduced. There is moderately elevated pulmonary artery systolic pressure. The tricuspid regurgitant velocity is 2.78 m/s, and with an assumed right atrial pressure of 15 mmHg, the estimated right ventricular systolic pressure is 00.7 mmHg. Left Atrium: Left atrial size was mildly dilated. Right Atrium: Right atrial size was mildly dilated. Pericardium: A moderately sized pericardial effusion is present. The pericardial effusion is circumferential. There is no evidence of cardiac tamponade. Mitral Valve: The mitral valve is normal in structure. Mild mitral valve regurgitation. No evidence of mitral valve stenosis. Tricuspid  Valve: The tricuspid valve is normal in structure. Tricuspid valve regurgitation is mild . No evidence of tricuspid stenosis. Aortic Valve: The aortic valve is tricuspid. Aortic valve regurgitation is not visualized. No aortic stenosis is present. Pulmonic Valve: The pulmonic valve was grossly normal. Pulmonic valve regurgitation is mild. No evidence of pulmonic stenosis. Aorta: The aortic root is normal in size and structure. Venous: The inferior vena cava is dilated in size with less than 50% respiratory variability, suggesting right atrial pressure of 15 mmHg. IAS/Shunts: The atrial septum is grossly normal. LEFT VENTRICLE PLAX 2D LVIDd:         5.50 cm      Diastology LVIDs:         5.00 cm      LV e' medial:    3.04 cm/s LV PW:         1.50 cm      LV E/e' medial:  35.9 LV IVS:        1.50 cm      LV e' lateral:   5.96 cm/s LVOT diam:     1.90 cm      LV E/e' lateral: 18.3 LV SV:         47 LV SV Index:   27           2D Longitudinal Strain LVOT Area:     2.84 cm     2D Strain GLS (A2C):   -6.3 %                             2D Strain GLS (A3C):   -5.7 %                             2D Strain GLS (A4C):   -5.6 % LV Volumes (MOD)            2D Strain GLS Avg:     -5.8 % LV vol d, MOD A2C: 261.0 ml LV vol d, MOD A4C:  227.0 ml LV vol s, MOD A2C: 202.0 ml LV vol s, MOD A4C: 162.0 ml LV SV MOD A2C:     59.0 ml LV SV MOD A4C:     227.0 ml LV SV MOD BP:      60.8 ml RIGHT VENTRICLE RV S prime:     7.20 cm/s TAPSE (M-mode): 1.1 cm LEFT ATRIUM         Index LA diam:    4.50 cm 2.60 cm/m  AORTIC VALVE LVOT Vmax:   113.00 cm/s LVOT Vmean:  72.300 cm/s LVOT VTI:    0.165 m  AORTA Ao Root diam: 3.10 cm MITRAL VALVE                TRICUSPID VALVE MV Area (PHT): 5.38 cm     TR Peak grad:   30.9 mmHg MV Decel Time: 141 msec     TR Vmax:        278.00 cm/s MV E velocity: 109.00 cm/s MV A velocity: 55.30 cm/s   SHUNTS MV E/A ratio:  1.97         Systemic VTI:  0.16 m                             Systemic Diam: 1.90 cm Buford Dresser MD Electronically signed by Buford Dresser MD Signature Date/Time: 01/11/2021/9:13:52  AM    Final    Disposition   Pt is being discharged home today in good condition.  Follow-up Plans & Appointments     Follow-up Information    [facility]. Go on 02/05/2021.   Specialty: Cardiology Why: at 10:00 AM in the Transitions of Care Clinic, Entrance C of [facility] Contact information: [address] [id number] [place] [phone]       Freada Bergeron, MD Follow up on 02/09/2021.   Specialties: Cardiology, Radiology Why: 9:00 AM Contact information: [address] [place] [address] [phone]                Discharge Medications   Allergies as of 02/02/2021   No Known Allergies     Medication List    STOP taking these medications   furosemide 20 MG tablet Commonly known as: LASIX   isosorbide-hydrALAZINE 20-37.5 MG tablet Commonly known as: BIDIL     TAKE these medications   amiodarone 200 MG tablet Commonly known as: PACERONE Take 1 tablet (200 mg total) by mouth daily.   ARIPiprazole 2 MG tablet Commonly known as: ABILIFY Take 1 tablet  (2 mg total) by mouth at bedtime.   Ensure Take 237 mLs by mouth See admin instructions. Drink 237 ml's (Vanilla) by mouth every morning and at bedtime   hydrALAZINE 50 MG tablet Commonly known as: APRESOLINE Take 1 tablet (50 mg total) by mouth 3 (three) times daily.   isosorbide mononitrate 60 MG 24 hr tablet Commonly known as: IMDUR Take 1 tablet (60 mg total) by mouth daily.   metoprolol 200 MG 24 hr tablet Commonly known as: TOPROL-XL Take 1 tablet (200 mg total) by mouth daily. Take with or immediately following a meal.   multivitamin with minerals Tabs tablet Take 1 tablet by mouth daily.          Outstanding Labs/Studies   Will need close follow up in CHF clinic.   Duration of Discharge Encounter   Greater than 30 minutes including physician time.  Signed, Tami Lin Roan [place], PA 02/02/2021, 11:30 AM

## 2021-02-02 NOTE — ED Notes (Signed)
Lunch Tray Ordered @ 1030. 

## 2021-02-02 NOTE — Progress Notes (Signed)
Progress Note  [name] Date of Encounter: 02/02/2021  Primary Cardiologist:   Freada Bergeron, MD   Subjective   She has no acute complaints  Inpatient Medications    Scheduled Meds: . adenosine (ADENOCARD) IV  6 mg Intravenous Once  . amiodarone  200 mg Oral Daily  . ARIPiprazole  2 mg Oral QHS  . feeding supplement  237 mL Oral BID AC & HS  . furosemide  20 mg Oral Daily  . heparin  5,000 Units Subcutaneous Q8H  . isosorbide-hydrALAZINE  2 tablet Oral TID  . metoprolol succinate  200 mg Oral Daily  . multivitamin with minerals  1 tablet Oral Daily  . sodium chloride flush  3 mL Intravenous Q12H   Continuous Infusions: . sodium chloride     PRN Meds: sodium chloride, acetaminophen, ondansetron (ZOFRAN) IV, sodium chloride flush   Vital Signs    Vitals:   02/02/21 0058 02/02/21 0404 02/02/21 0546 02/02/21 0600  BP: 115/61 134/76 134/81 130/79  Pulse: 79 96 73 67  Resp: 16 16 16 16   Temp: 99.1 F (37.3 C) 98.6 F (37 C) 97.7 F (36.5 C) 97.7 F (36.5 C)  TempSrc: Oral Oral  Oral  SpO2: 100% 99%  100%   No intake or output data in the 24 hours ending 02/02/21 0830 There were no vitals filed for this visit.  Telemetry    NSR - Personally Reviewed  ECG    NA - Personally Reviewed  Physical Exam   GEN: No acute distress.   Neck: No  JVD Cardiac: RRR, no murmurs, rubs, or gallops.  Respiratory: Clear  to auscultation bilaterally. GI: Soft, nontender, non-distended  MS: No  edema; No deformity. Neuro:  Nonfocal  Psych: Normal affect   Labs    Chemistry Recent Labs  Lab 01/27/21 0239 01/31/21 0935 02/01/21 0859 02/01/21 1854 02/02/21 0316  NA 133* 139 137  --  136  K 4.1 4.3 4.7  --  4.6  CL 94* 100 99  --  101  CO2 25 26 25   --  24  GLUCOSE 98 76 97  --  87  BUN 41* 25* 30*  --  34*  CREATININE 1.69* 1.26* 1.44* 1.56* 1.57*  CALCIUM 9.1 9.9 9.8  --  9.5  PROT  --  7.6  --   --   --   ALBUMIN 2.4* 2.9*  --    --   --   AST  --  21  --   --   --   ALT  --  21  --   --   --   ALKPHOS  --  113  --   --   --   BILITOT  --  0.6  --   --   --   GFRNONAA 37* 53* 45* 41* 41*  ANIONGAP 14 13 13   --  11     Hematology Recent Labs  Lab 02/01/21 0859 02/01/21 1854 02/02/21 0316  WBC 10.4 12.1* 9.5  RBC 3.98 3.53* 3.59*  HGB 8.3* 7.3* 7.5*  HCT 28.6* 25.6* 26.1*  MCV 71.9* 72.5* 72.7*  MCH 20.9* 20.7* 20.9*  MCHC 29.0* 28.5* 28.7*  RDW 33.8* 33.7* 34.3*  PLT 720* 673* 684*    Cardiac EnzymesNo results for input(s): TROPONINI in the last 168 hours. No results for input(s): TROPIPOC in the last 168 hours.   BNP Recent Labs  Lab 02/01/21 0906  BNP 2,098.5*  DDimer No results for input(s): DDIMER in the last 168 hours.   Radiology    DG Chest Portable 1 View  Result Date: 02/01/2021 CLINICAL DATA:  Supraventricular tachycardia EXAM: PORTABLE CHEST 1 VIEW COMPARISON:  01/22/2021 FINDINGS: Unchanged cardiomegaly. No significant pulmonary vascular congestion. Lungs are clear. IMPRESSION: Unchanged cardiomegaly. Electronically Signed   By: Miachel Roux M.D.   On: 02/01/2021 09:28    Cardiac Studies   NA  Patient Profile     45 y.o. female with a hx of chronic combined heart failure, HTN, moderate pericardial effusion, paroxysmal SVT, anemia, AKI, vascular demential with paranoid psychosis, and uterine fibroids who is being seen for the evaluation of SVT at the request of Dr. Langston Masker.  Assessment & Plan    SVT:  No further arrhythmias.  Continue meds as listed  HTN:    BP is easy to control when she gets her meds.  We are working on getting prior auth for the BiDil and I would like to arrange blister packs to be delivered if we can arrange this.    DILATED CARDIOMYOPATHY:  Hypertensive heart disease.  Euvolemic.  Hold on further diuresis and follow BMET closely as an out patient.    COVID POSITIVE:   No therapy or further precautions per the IM service   ANEMIA:  This is stable  and baseline.    AKI:  Creat is mildly elevated.  I will hold off on further diuresis.  She will need to back in the office next week for TOC appt and lab follow up.    SOCIAL:  We are working on getting her meds to her.  We are also looking into a worry she expressed today about food insecurity.     For questions or updates, please contact [place] Please consult [email or web address] for contact info under Cardiology/STEMI.   Signed, Minus Breeding, MD  02/02/2021, 8:30 AM

## 2021-02-02 NOTE — Progress Notes (Signed)
Internal Medicine Teaching Service paged for recommendations concerning recent diagnosis of NPMVA-27 on 7/37 for [name] when she was recently admitted to the IMTS. She was admitted to the Internal Medicine Teaching Service from 01/11/20-01/28/20 for acute on chronic biventricular heart failure, recurrent AVNRT, AKI, hypertensive urgency, large uterine fibroid causing partial compression of the bilateral iliac veins, iron deficiency anemia 2/2 menometrorrhagia and paranoia and psychosis.  During prior admission she was completely asymptomatic for covid and was able to come off of precautions 01/22/20. She had hospital follow up with the IMTS clinic on 2/09 with residual cough but no SOB, hypertension, and no recurrence of her SVT. Vitals were otherwise stable and 100% saturation on room air and temperature of 98.5.  She remains covid positive when tested today, which is not completely unexpected and can occur with recent covid diagnoses. She is past the precautionary window for even highly symptomatic patients and remains on room air. She does not need to be on precautions. She also needs to establish with [facility] on 2/21 and has an appointment at that time.  Please call with any additional questions or concerns.   Molli Hazard A, DO 02/02/2021, 9:54 AM Pager: [phone]

## 2021-02-02 NOTE — Progress Notes (Signed)
Heart Failure Patient Advocate Encounter   Received notification from [facility] that prior authorization for BiDil is required.   PA submitted on CoverMyMeds Key [id number]  Prior authorization was denied. Insurance requires separate prescriptions for hydralazine and isosorbide. She will be receiving her meds through pill packing services and delivery with [facility] so # of tablets per day may no longer be an issue. Will hold off submitting an appeal at this time as she is trying to acquire [place] Medicaid.    Kerby Nora, PharmD, BCPS Heart Failure Stewardship Pharmacist Phone [phone]  Please check [email or web address] for unit-specific pharmacist phone numbers

## 2021-02-02 NOTE — ED Notes (Signed)
Breakfast ordered 

## 2021-02-02 NOTE — Progress Notes (Signed)
Heart and Vascular Care Navigation  9/56/3875  Cyntha Brickman [birth year] [id number]  Reason for Referral: CSW referred for concerns with food security and inability to afford medications.                                                                                                    Assessment:  Pt reports she moved from [place] to [place] about a year ago now and has been living with her brother.  They are planning on moving into a house in about 2 weeks.    Pt reports she is unable to prepare food for herself and that this is something her brother normally does for her.  Is worried that she won't have proper meals over the next couple of weeks because her brother will be preoccupied with the move.    Pt also reports she normally works but with her health she hasn't been able to work recently and cannot afford her medications.  Pt has [place] Medicaid which doesn't cover meds in [place].  CSW explained importance of contacting [place] Medicaid office to initiate process of transferring medicaid with her case worker there.  She thinks she has spoken to someone before about this but doesn't remember if they were with [place] or [place] and can't remember how long ago that was.                                    HRT/VAS Care Coordination    Living arrangements for the past 2 months Apartment   Lives with: Siblings   Patient Current Insurance Coverage Medicaid  [place] Medicaid   Patient Has Concern With Paying Medical Bills Yes   Patient Concerns With Medical Bills out of state Medicaid won't cover [place] care   Medical Bill Referrals: referred pt to talk to her case worker with [place] Medicaid to initiate transferring to [facility]   Does Patient Have Prescription Coverage? No   Home Assistive Devices/Equipment None   DME Agency NA      Social History:                                                                             SDOH Screenings   Alcohol Screen: Not on file  Depression (TKZ6-0): Not on file  Financial Resource  Strain: Medium Risk  . Difficulty of Paying Living Expenses: Somewhat hard  Food Insecurity: No Food Insecurity  . Worried About Charity fundraiser in the Last Year: Never true  . Ran Out of Food in the Last Year: Never true  Housing: Low Risk   . Last Housing Risk Score: 0  Physical Activity: Not on file  Social Connections: Not on file  Stress: Not on file  Tobacco Use: Not on file  Transportation Needs: Not on file    SDOH Interventions: Financial Resources:  Financial Strain Interventions: Other (Comment) (utlized patient care fund for one time assistance)   Food Insecurity:  Food Insecurity Interventions: Other (Comment) (pt concerned with preparing food so made short term [employer] referral)  Housing Insecurity:   none reported  Transportation:   Transportation Interventions: Financial planner   Follow-up plan:    Pt to follow up with [place] DSS to initiate transfer of Medicaid to [place].  CSW placed referral to [facility] to get 2 weeks of [employer] to help pt be able to eat properly during her move.  CSW will inform Raquel Sarna regarding pt needs so she can follow up in [facility] clinic.  Will continue to follow and assist as needed  Jorge Ny, [facility] Desk#: [phone] Cell#: [phone]

## 2021-02-02 NOTE — Progress Notes (Signed)
Internal Medicine Clinic Attending  Case discussed with Dr. Masoudi  At the time of the visit.  We reviewed the resident's history and exam and pertinent patient test results.  I agree with the assessment, diagnosis, and plan of care documented in the resident's note.  

## 2021-02-05 ENCOUNTER — Encounter (HOSPITAL_COMMUNITY): Payer: Self-pay

## 2021-02-05 ENCOUNTER — Telehealth (HOSPITAL_COMMUNITY): Payer: Self-pay | Admitting: Licensed Clinical Social Worker

## 2021-02-05 ENCOUNTER — Telehealth: Payer: Self-pay | Admitting: *Deleted

## 2021-02-05 ENCOUNTER — Other Ambulatory Visit (HOSPITAL_COMMUNITY): Payer: Self-pay | Admitting: Adult Health

## 2021-02-05 ENCOUNTER — Ambulatory Visit (HOSPITAL_COMMUNITY)
Admission: RE | Admit: 2021-02-05 | Discharge: 2021-02-05 | Disposition: A | Discharge status: Home / Self Care | Payer: BLUE CROSS/BLUE SHIELD | Source: Ambulatory Visit | Attending: Internal Medicine | Admitting: Internal Medicine

## 2021-02-05 ENCOUNTER — Encounter: Payer: Medicaid Other | Admitting: Student

## 2021-02-05 ENCOUNTER — Other Ambulatory Visit: Payer: Self-pay

## 2021-02-05 VITALS — BP 166/74 | HR 74 | Ht 65.5 in | Wt 143.0 lb

## 2021-02-05 DIAGNOSIS — I5043 Acute on chronic combined systolic (congestive) and diastolic (congestive) heart failure: Secondary | ICD-10-CM | POA: Insufficient documentation

## 2021-02-05 DIAGNOSIS — D500 Iron deficiency anemia secondary to blood loss (chronic): Secondary | ICD-10-CM | POA: Diagnosis not present

## 2021-02-05 DIAGNOSIS — N921 Excessive and frequent menstruation with irregular cycle: Secondary | ICD-10-CM | POA: Diagnosis not present

## 2021-02-05 DIAGNOSIS — D509 Iron deficiency anemia, unspecified: Secondary | ICD-10-CM | POA: Diagnosis not present

## 2021-02-05 DIAGNOSIS — I110 Hypertensive heart disease with heart failure: Secondary | ICD-10-CM | POA: Diagnosis not present

## 2021-02-05 DIAGNOSIS — I471 Supraventricular tachycardia: Secondary | ICD-10-CM | POA: Insufficient documentation

## 2021-02-05 DIAGNOSIS — Z87891 Personal history of nicotine dependence: Secondary | ICD-10-CM | POA: Insufficient documentation

## 2021-02-05 DIAGNOSIS — Z79899 Other long term (current) drug therapy: Secondary | ICD-10-CM | POA: Insufficient documentation

## 2021-02-05 DIAGNOSIS — I313 Pericardial effusion (noninflammatory): Secondary | ICD-10-CM | POA: Diagnosis not present

## 2021-02-05 DIAGNOSIS — I5082 Biventricular heart failure: Secondary | ICD-10-CM | POA: Insufficient documentation

## 2021-02-05 LAB — CULTURE, BLOOD (SINGLE): Culture: NO GROWTH

## 2021-02-05 MED FILL — TORSEMIDE 20 MG TABLET: 20 | 30 days supply | Qty: 30 | Fill #0

## 2021-02-05 NOTE — Patient Instructions (Signed)
Start Torsemide 20 mg Daily  You have been referred to the [facility], a paramedic will call you to schedule an appointment  You have been referred to follow up in our [facility] on Monday 2/21 at 12:00 PM  Thank you for allowing [place] to provider your heart failure care after your recent hospitalization.  If you have any questions, issues, or concerns before your next appointment please call our office at [phone], opt. 2 and leave a message for the triage nurse.  Do the following things EVERYDAY: 1) Weigh yourself in the morning before breakfast. Write it down and keep it in a log. 2) Take your medicines as prescribed 3) Eat low salt foods--Limit salt (sodium) to 2000 mg per day.  4) Stay as active as you can everyday 5) Limit all fluids for the day to less than 2 liters

## 2021-02-05 NOTE — Progress Notes (Signed)
ReDS Vest / Clip - 02/05/21 1200      ReDS Vest / Clip   Station Marker A    Ruler Value 21    ReDS Value Range Low volume    ReDS Actual Value 34

## 2021-02-05 NOTE — Telephone Encounter (Signed)
CSW informed by [facility] that pt will be coming to [facility] for future care and is in need of the [employer].  CSW completed referral form for paramedicine and requested paramedic reach out to patient this week to see as there are concerns about home situation and how patient is managing care at home.  Will continue to follow and assist as needed  Jorge Ny, [facility] Desk#: [phone] Cell#: [phone]

## 2021-02-05 NOTE — Progress Notes (Signed)
RX for Torsemide faxed to [facility]. Will p/u med and adjust pt's pill box before she leaves clinic.

## 2021-02-05 NOTE — Progress Notes (Signed)
Heart and Vascular Care Navigation  7/56/4332  Holly Bradley [birth year] [id number]  Reason for Referral: Patient seen in the HF [facility] clinic for follow up.                                                                                                    Assessment: Patient reports she is living with her brother in an apartment and plan to move to a new home in the near future. Patient states she was previously living in [place] and was collecting unemployment and had medicaid benefits at the time. She states she moved here to [facility] almost a year ago and has not applied for medicaid, disability or any other benefits. She currently has no income although states she is paying for an insurance plan on the [employer]. She states her brother has been helping her financially and would welcome assistance with applying for disabaility and food stamps.                                HRT/VAS Care Coordination    Patients Home Cardiology Office Heart Failure Clinic   Outpatient Care Team Social Worker   Social Worker Name: Raquel Sarna, [place] [phone]   Living arrangements for the past 2 months Apartment   Lives with: Siblings  Patient resides with her brother   Patient Current Insurance English as a second language teacher ([place] insurance)   Patient Has Concern With Paying Medical Bills Yes   Patient Concerns With Medical Bills out of state Medicaid won't cover [place] care   Medical Bill Referrals: referred pt to talk to her case worker with [place] Medicaid to initiate transferring to [facility]   Does Patient Have Prescription Coverage? Yes   Home Assistive Devices/Equipment Scales   DME Agency NA   Current home services Other (comment)  Referral made to [facility] for 2 weeks of service      Social History:                                                                             SDOH Screenings   Alcohol Screen: Not on file  Depression (FUX3-2): Not on file  Financial Resource Strain: High  Risk  . Difficulty of Paying Living Expenses: Very hard  Food Insecurity: No Food Insecurity  . Worried About Charity fundraiser in the Last Year: Never true  . Ran Out of Food in the Last Year: Never true  Housing: Low Risk   . Last Housing Risk Score: 0  Physical Activity: Not on file  Social Connections: Not on file  Stress: Not on file  Tobacco Use: High Risk  . Smoking Tobacco Use: Current Some Day Smoker  . Smokeless Tobacco Use:  Current Soil scientist Needs: Unmet Transportation Needs  . Lack of Transportation (Medical): Yes  . Lack of Transportation (Non-Medical): Yes    SDOH Interventions: Financial Resources:  Financial Strain Interventions: Other (Comment) Leisure centre manager to [employer] for Disability) Financial Counseling for [employer] Program as [employer] does not cover [facility] facilities.  Food Insecurity:   Referred to Copywriter, advertising for Food Stamp application  Housing Insecurity:   n/a  Transportation:   Transportation Interventions: Financial planner     Follow-up plan:  CSW referred patient to [employer] for application assistance with disability and food stamps. Patient will be followed by the AHF clinic and [facility] for further HF and [facility] follow up. Raquel Sarna, [place], [place]

## 2021-02-05 NOTE — Progress Notes (Signed)
Pt's BCBS did pay for her Torsemide at [facility]. Pill box provided and filled for pt. New pt appt sch for Mon 2/21 in the [facility]. Will arrange f/u with GYN and call pt with that appt later, she is agreeable with this plan and verbalized understanding.

## 2021-02-05 NOTE — Telephone Encounter (Signed)
Returned Heather's call from heart and vascular; explained that per Dr Denman George the patient can see a regular gyn doctor and gave the number for [facility]

## 2021-02-05 NOTE — Progress Notes (Signed)
Pt missed appt w/Dr Denman George, GYN on 2/7, have called her office and left VM pt needs to get resch, they can call pt directly or myself to resch. Pt is aware this has been done and is agreeable to go to appt now that she is set up with [employer]

## 2021-02-07 ENCOUNTER — Encounter (HOSPITAL_COMMUNITY): Payer: Self-pay

## 2021-02-07 NOTE — Progress Notes (Deleted)
Cardiology Office Note:    Date:  6/59/9357   ID:  Damon Baisch, DOB [birth year], MRN [id number]  PCP:  Patient, No Pcp Per   [place]  Cardiologist:  Freada Bergeron, MD  Advanced Practice Provider:  No care team member to display Electrophysiologist:  Vickie Epley, MD   Referring MD: No ref. provider found    History of Present Illness:    Holly Bradley is a 45 y.o. female with a hx of chronic combined heart failure, HTN, moderate pericardial effusion, paroxysmal SVT, anemia, AKI, vascular demential with paranoid psychosis, and uterine fibroidswho presents to clinic for follow-up from recent hospitalization.  Patient was admitted to [facility] from 01/10/21 to 01/27/21 with acute on chronic systolic and diastolic heart failure, recurrent SVT, NSVT, pericardial effusion, iron deficiency anemia, and acute renal failure. SVT treated with multiple rounds of adenosine. EP was consulted and confirmed SVT with 2 different RP intervals. PVC burden was 3-5%, amiodarone was continued. She was not a candidate for ablation. Cardiomyopathy worsened compared to echo on 04/2020 with EF now 20-25%. She was not a candidate for invasive ischemic evaluation. Etiology of worsening CM unclear, but uncontrolled HTN likely contributory. Echo also showed unchanged moderate pericardial effusion. She was diuresed and discharged on metoprolol and bidil. Initial plan was to discharge to psychiatric unit; however, she was cleared to discharge home.   She represented to [facility] from 02/01/21-02/02/21 with recurrent SVT with HR 180s with concomitant dyspnea, CP, nausea, and vomiting. She spontaneously converted to sinus rhythmwithout adenosine or vagal maneuvers. She reported financial constraints and did not pick up any medications after her recent discharge.   Past Medical History:  Diagnosis Date  . Chronic blood loss anemia    Related to uterine fibroids  . Essential hypertension     Poorly controlled, repeat by report only on nicardipine 90 mg  . Fibroid    Likely complicated by by menometrorrhagia and chronic anemia  . Paroxysmal SVT (supraventricular tachycardia) (HCC)    Frequent episodes, can last anywhere from 20 minutes to 12-15 hours.  Not on beta-blocker or calcium channel blocker    Past Surgical History:  Procedure Laterality Date  . Unknown      Current Medications: No outpatient medications have been marked as taking for the 02/09/21 encounter (Appointment) with Freada Bergeron, MD.     Allergies:   Patient has no known allergies.   Social History   Socioeconomic History  . Marital status: Single    Spouse name: Not on file  . Number of children: Not on file  . Years of education: Not on file  . Highest education level: Not on file  Occupational History  . Not on file  Tobacco Use  . Smoking status: Current Some Day Smoker  . Smokeless tobacco: Current User  Substance and Sexual Activity  . Alcohol use: Never  . Drug use: Not Currently    Types: Marijuana  . Sexual activity: Not on file  Other Topics Concern  . Not on file  Social History Narrative  . Not on file   Social Determinants of Health   Financial Resource Strain: High Risk  . Difficulty of Paying Living Expenses: Very hard  Food Insecurity: No Food Insecurity  . Worried About Charity fundraiser in the Last Year: Never true  . Ran Out of Food in the Last Year: Never true  Transportation Needs: Unmet Transportation Needs  . Lack of Transportation (Medical): Yes  .  Lack of Transportation (Non-Medical): Yes  Physical Activity: Not on file  Stress: Not on file  Social Connections: Not on file     Family History: The patient's ***Family history is unknown by patient.  ROS:   Please see the history of present illness.    *** All other systems reviewed and are negative.  EKGs/Labs/Other Studies Reviewed:    The following studies were reviewed today: ECHO  01/11/2021 1. Left ventricular ejection fraction, by estimation, is 20 to 25%. The  left ventricle has severely decreased function. The left ventricle  demonstrates global hypokinesis. The left ventricular internal cavity size  was mildly to moderately dilated. There  is moderate concentric left ventricular hypertrophy. Left ventricular  diastolic parameters are consistent with Grade II diastolic dysfunction  (pseudonormalization). Elevated left ventricular end-diastolic pressure.  The average left ventricular global  longitudinal strain is -5.8 %. The global longitudinal strain is abnormal.  2. Right ventricular systolic function is moderately reduced. The right  ventricular size is normal. There is moderately elevated pulmonary artery  systolic pressure.  3. Left atrial size was mildly dilated.  4. Right atrial size was mildly dilated.  5. Moderate pericardial effusion. The pericardial effusion is  circumferential. There is no evidence of cardiac tamponade.  6. The mitral valve is normal in structure. Mild mitral valve  regurgitation. No evidence of mitral stenosis.  7. The aortic valve is tricuspid. Aortic valve regurgitation is not  visualized. No aortic stenosis is present.  8. The inferior vena cava is dilated in size with <50% respiratory  variability, suggesting right atrial pressure of 15 mmHg.    ECHO 05/02/2020  1. Left ventricular ejection fraction, by estimation, is 40 to 45%. The  left ventricle has mildly decreased function. The left ventricle  demonstrates global hypokinesis. There is mild concentric left ventricular  hypertrophy. Left ventricular diastolic  parameters are consistent with Grade II diastolic dysfunction  (pseudonormalization). Elevated left atrial pressure.  2. Right ventricular systolic function is low normal. The right  ventricular size is normal. There is moderately elevated pulmonary artery  systolic pressure.  3. Left atrial size was  mildly dilated.  4. Right atrial size was mildly dilated.  5. Moderate pericardial effusion. The pericardial effusion is  circumferential. There is no evidence of cardiac tamponade.  6. The mitral valve is normal in structure. No evidence of mitral valve  regurgitation.  7. Tricuspid valve regurgitation is mild to moderate.  8. The aortic valve is normal in structure. Aortic valve regurgitation is  not visualized.  9. The inferior vena cava is dilated in size with <50% respiratory  variability, suggesting right atrial pressure of 15 mmHg  EKG:  EKG is *** ordered today.  The ekg ordered today demonstrates ***  Recent Labs: 01/23/2021: TSH 1.953 01/31/2021: ALT 21 02/01/2021: B Natriuretic Peptide 2,098.5; Magnesium 2.0 02/02/2021: BUN 34; Creatinine, Ser 1.57; Hemoglobin 7.5; Platelets 684; Potassium 4.6; Sodium 136  Recent Lipid Panel No results found for: CHOL, TRIG, HDL, CHOLHDL, VLDL, LDLCALC, LDLDIRECT   Risk Assessment/Calculations:   {Does this patient have ATRIAL FIBRILLATION?:[phone]}   Physical Exam:    VS:  LMP 02/03/2021     Wt Readings from Last 3 Encounters:  02/05/21 143 lb (64.9 kg)  01/31/21 135 lb 14.4 oz (61.6 kg)  01/27/21 125 lb 10.6 oz (57 kg)     GEN: *** Well nourished, well developed in no acute distress HEENT: Normal NECK: No JVD; No carotid bruits LYMPHATICS: No lymphadenopathy CARDIAC: ***RRR, no  murmurs, rubs, gallops RESPIRATORY:  Clear to auscultation without rales, wheezing or rhonchi  ABDOMEN: Soft, non-tender, non-distended MUSCULOSKELETAL:  No edema; No deformity  SKIN: Warm and dry NEUROLOGIC:  Alert and oriented x 3 PSYCHIATRIC:  Normal affect   ASSESSMENT:    No diagnosis found. PLAN:    In order of problems listed above:  Chronic combinedsystolic and diastolic heart failure NYHA class III symptoms.TTE 04/2020 with EF 40-45% with grade II diastolic dysfunction-->now LVEF 20-25%. Suspect poorly controlled hypertension is  primary driver of reduced EF. Patient not a candidate for ischemic evaluation due to non-compliance. Also per HF team, concern for amyloidosis given degree of LVH. - TTE with LVEF ~20%, moderate RV dysfunction, moderate PAH, moderate effusion, RAP >48mmHg -Started on torsemide 20mg  daily per HF - Plan for cardiac MRI to assess for amyloid -Continue bidil for afterload reduction -Continuemetop to 37.5mg  TID due to recurrent episodes of SVT - Monitor I/Os and daily standing weights - Will need extensive counseling about the need to follow-up as well as medication and dietary compliance  Hx of moderate pericardial effusion (04/2020) Noted during last admission on 04/2020.Lost to follow-up.CTon 01/20/22showed small-to-moderate effusion.TTE on 01/11/21 with stable moderate effusion; no tamponade. - Remains moderate on TTE - Diuresis as above  Paroxysmal SVT Deemed not a candidate for ablation. 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 -Was having some paranoia/psychosis was started on abilify during admission in January and never received psychiatric care -Follow-up with psych and neuro as scheduled    {Are you ordering a CV Procedure (e.g. stress test, cath, DCCV, TEE, etc)?   Press F2        :[id number]}    Medication Adjustments/Labs and Tests Ordered: Current medicines are reviewed at length with the patient today.  Concerns regarding medicines are outlined above.  No orders of the defined types were placed in this encounter.  No orders of the defined types were placed in this encounter.   There are no Patient Instructions on file for this visit.   Signed, Freada Bergeron, MD  02/07/2021 4:14 PM    [facility]

## 2021-02-08 ENCOUNTER — Telehealth (HOSPITAL_COMMUNITY): Payer: Self-pay

## 2021-02-08 NOTE — Telephone Encounter (Signed)
Pt cancelled our appointment today. She reports she isnt feeling well and prefers to not have a home visit today.  She has appointment on Monday at clinic and plans to attend that.   [name], EMT-Paramedic  02/08/21

## 2021-02-09 ENCOUNTER — Telehealth (HOSPITAL_COMMUNITY): Payer: Self-pay | Admitting: *Deleted

## 2021-02-09 ENCOUNTER — Ambulatory Visit: Payer: Medicaid Other | Admitting: Cardiology

## 2021-02-09 NOTE — Telephone Encounter (Signed)
Pt left VM on triage line that she was in the yellow zone, had swelling, sob, chest tightnes, and fatigue.   Called pt back, she states she has been weighing this week and wt is up 1-2 lbs, she has edema in feet/ankles up to her knees and abd distention as well, she states she gets SOB when she lays down which makes her chest feel tight. She states she has been taking the Torsemide 20 mg Daily.  Per Dr Shan Levans pt should increase to 40 mg Daily until appt in AHF clinic on Mon 2/21  Pt is aware, agreeable, and verbalized understanding, she states she will be here Mon at 12 for her appt, we also discussed fluid and salt restrictions.

## 2021-02-10 NOTE — Progress Notes (Signed)
Advanced Heart Failure Consult Note  PCP: No PCP Primary Cardiologist: Dr. Johney Frame HF Cardiologist: Dr. Haroldine Laws  HPI:  Holly Bradley is a 45 y.o.  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  The majority of patient's prior medical history centers around severe uterine fibroids and bleeding, also poorly controlled HTN and tobacco use. Referred to IR for embolization procedure to improve fibroid bleeding 2019 most visits she has been severely hypertensive.    Admitted in May 2021 with SVT which apparently had been diagnosed before in 2019 according to the patient. Occurred in the setting of a hgb of 4.4 due to ongoing uterine bleeding.  Additionally she was found to be in low EF CHF, secondary to SVT, marked anemia, and  poorly controlled HTN.  She was given adenosine and PRBCs she converted to NSR, placed on a beta blocker, received a minimal amount of IV lasix and was discharged..  Additionally, then added an ACEI for bp and restarted her nifedipine.   Admitted 12/2020 for dyspnea, palpitations in SVT, which terminated with adenosine.  Also hypertensive with AKI. Responded to IV lasix with improvement in renal function, transitioned to oral lasix, but renal function worsened and lasix held.  Hospitalization c/b episodes of NSVT. She was started on Imdur, Toprol XL, & amiodarone.  ED visit on 01/1021 admitted for one day by cardiology for another episode of SVT, HTN.  She spontaneously converted to NSR.  She was discharged on increased dosage of imdur 60 and hydral 50 tid.     Today she returns for AHF consult. She was [facility] in [facility] after last hospitalization.  She still had palpitations, orthopnea, and SOB with stairs. Worsening SOB & swelling over past 2 weeks. Has been taking torsemide 20 mg daily and doubled up over the weekend.  Some CP with breathing. Denies worsening CP, dizziness, or PND/Orthopnea. Appetite fair, has early satiety. No fever or chills. Weight at home up 15 lbs pounds. Taking all medications. Stopped smoking in January.  Review of Systems: [y] = yes, '[ ]'  = no   General: Weight gain '[x]' ; Weight loss '[ ]' ; Anorexia [x ]; Fatigue [x ]; Fever '[ ]' ; Chills '[ ]' ; Weakness '[ ]'   Cardiac: Chest pain/pressure [x ]; Resting SOB '[ ]' ; Exertional SOB [x ]; Orthopnea '[ ]' ; Pedal Edema [x ]; Palpitations '[ ]' ; Syncope '[ ]' ; Presyncope '[ ]' ; Paroxysmal nocturnal dyspnea'[ ]'   Pulmonary: Cough '[ ]' ; Wheezing'[ ]' ; Hemoptysis'[ ]' ; Sputum '[ ]' ; Snoring '[ ]'   GI: Vomiting'[ ]' ; Dysphagia'[ ]' ; Melena'[ ]' ; Hematochezia '[ ]' ; Heartburn'[ ]' ; Abdominal pain '[ ]' ; Constipation '[ ]' ; Diarrhea '[ ]' ; BRBPR '[ ]'   GU: Hematuria'[ ]' ; Dysuria '[ ]' ; Nocturia'[ ]'   Vascular: Pain in legs with walking '[ ]' ; Pain in feet with lying flat '[ ]' ; Non-healing sores '[ ]' ; Stroke '[ ]' ; TIA '[ ]' ; Slurred speech '[ ]' ;  Neuro: Headaches'[x]' ; Vertigo'[ ]' ; Seizures'[ ]' ; Paresthesias'[ ]' ;Blurred vision '[ ]' ; Diplopia '[ ]' ; Vision changes '[ ]'   Ortho/Skin: Arthritis '[ ]' ; Joint pain '[ ]' ; Muscle pain '[ ]' ; Joint swelling '[ ]' ; Back Pain '[ ]' ; Rash '[ ]'   Psych: Depression'[ ]' ; Anxiety'[ ]'   Heme: Bleeding problems '[ ]' ; Clotting disorders '[ ]' ; Anemia '[ ]'   Endocrine: Diabetes '[ ]' ; Thyroid dysfunction'[ ]'   SH:  Social History   Socioeconomic History  . Marital status: Single  Spouse name: Not on file  . Number of children: Not on file  . Years of education: Not on file  . Highest education level: Not on file  Occupational History  . Not on file  Tobacco Use  . Smoking status: Current Some Day Smoker  . Smokeless tobacco: Current User  Substance and Sexual Activity  . Alcohol use: Never  . Drug use: Not Currently    Types: Marijuana  . Sexual activity: Not on file  Other Topics Concern  . Not on file  Social History Narrative  . Not on file   Social Determinants of Health   Financial  Resource Strain: High Risk  . Difficulty of Paying Living Expenses: Very hard  Food Insecurity: No Food Insecurity  . Worried About Charity fundraiser in the Last Year: Never true  . Ran Out of Food in the Last Year: Never true  Transportation Needs: Unmet Transportation Needs  . Lack of Transportation (Medical): Yes  . Lack of Transportation (Non-Medical): Yes  Physical Activity: Not on file  Stress: Not on file  Social Connections: Not on file  Intimate Partner Violence: Not on file   FH:  Family History  Family history unknown: Yes   Past Medical History:  Diagnosis Date  . Chronic blood loss anemia    Related to uterine fibroids  . Essential hypertension    Poorly controlled, repeat by report only on nicardipine 90 mg  . Fibroid    Likely complicated by by menometrorrhagia and chronic anemia  . Paroxysmal SVT (supraventricular tachycardia) (HCC)    Frequent episodes, can last anywhere from 20 minutes to 12-15 hours.  Not on beta-blocker or calcium channel blocker    Current Outpatient Medications  Medication Sig Dispense Refill  . amiodarone (PACERONE) 200 MG tablet Take 1 tablet (200 mg total) by mouth daily. 30 tablet 11  . ARIPiprazole (ABILIFY) 2 MG tablet Take 1 tablet (2 mg total) by mouth at bedtime. 30 tablet 11  . Ensure (ENSURE) Take 237 mLs by mouth See admin instructions. Drink 237 ml's (Vanilla) by mouth every morning and at bedtime    . hydrALAZINE (APRESOLINE) 50 MG tablet Take 1 tablet (50 mg total) by mouth 3 (three) times daily. 90 tablet 11  . isosorbide mononitrate (IMDUR) 60 MG 24 hr tablet Take 1 tablet (60 mg total) by mouth daily. 30 tablet 11  . metoprolol (TOPROL-XL) 200 MG 24 hr tablet Take 1 tablet (200 mg total) by mouth daily. Take with or immediately following a meal. 30 tablet 11   No current facility-administered medications for this encounter.    Vitals:   02/12/21 1223  BP: (!) 220/100  Pulse: 91  SpO2: 100%  Weight: 73.6 kg (162  lb 3.2 oz)   Wt Readings from Last 3 Encounters:  02/12/21 73.6 kg (162 lb 3.2 oz)  02/05/21 64.9 kg (143 lb)  01/31/21 61.6 kg (135 lb 14.4 oz)   PHYSICAL EXAM: General:  NAD. Minimal resp difficulty when speaking. HEENT: Normal Neck: Supple. JVP to jaw. Carotids 2+ bilat; no bruits. No lymphadenopathy or thryomegaly appreciated. Cor: PMI nondisplaced. Regular rate & rhythm. No rubs, gallops, I/VI SEM RUSB, +s3 Lungs: Clear Abdomen: +tender, + distended. No hepatosplenomegaly. No bruits or masses. Good bowel sounds. Extremities: No cyanosis, clubbing, rash, 3+ bilateral pitting edema to knees Neuro: alert & oriented x 3, cranial nerves grossly intact. Moves all 4 extremities w/o difficulty. Affect pleasant.  ASSESSMENT & PLAN: 1. Chronic Combined Systolic and  Diastolic CHF, Biventricular CHF - Long standing poorly controlled HTN, Atherosclerosis, SVT. - Echo (2/22) EF down to 20-25% compared to (2019) EF 40-45% . - Stable Moderate pericardial effusion. - Concern for amyloid given age and degree of hypertrophy on echo. - Will need cMRI, Multiple Myeloma panel, based on initial results Nuclear PYP scan. - Will need R/LHC. - NYHA Class IIIb symptoms, volume up on exam, Reds 25%. - Give lasix 80 mg IV x 1 today, + metolazone 2.5 mg x 1 + 40 mEq of KCl. - Tomorrow, Increase torsemide to 40 mg bid + KCl 20 mEq bid. - Will take metolazone 2.5 mg daily next 3 days (Mon, Tues, Wed). - Start Entresto 49/51 mg bid. - Continue Toprol XL 200 mg daily. - Consider addition of spiro & SGLT2i soon. - CMET & BNP today  2. Paroxysmal SVT - Regular on exam - Continue amiodarone 200 mg & beta blocker. - Needs EP follow up.  - TSH ok (2/22) - CMET today.  3. Hypertensive Urgency - Likely due to volume overload  - Give hydralazine 50 mg po x 1 now. - Increase hydralazine to 100 mg tid + Imdur 60 daily. - Entresto and increase diuretics as above. - Labs today.  4. CKD 3b - Baseline SCr ~  1.5-1.7 - BMET today  5. Iron Deficiency Anemia, 2/2 uterine fibroids  - Needs definitive management by OB/GYN, was set up for ablation by IR in [place] however this was never completed. - Follow up with GYN, may need IR embolization. - Likely will need Feraheme infusions. - CBC today.  6. 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 - Had paranoia/psychosis in Jan 2022 admission; started on Abilify but never received psychiatric care. - Will need outpatient neurology follow up going forward (? organic brain disease).  7. Tobacco Use: - Quit last month. - Congratulated on cessation.  8. SDOH: - Given BP cuff today. - HFSW assisting her with disability and medicaid. - Will need Paramedicine.  - Follow back in APP clinic Wednesday.  - Will defer arrangement for ischemic work up and cMRI, refer to EP until diuresed.  Allena Katz, FNP-BC 02/12/21  Patient seen and examined with the above-signed Advanced Practice Provider and/or Housestaff. I personally reviewed laboratory data, imaging studies and relevant notes. I independently examined the patient and formulated the important aspects of the plan. I have edited the note to reflect any of my changes or salient points. I have personally discussed the plan with the patient and/or family.  She is markedly volume overloaded and hypertensive. Weight up 20 pounds.   General:  Sitting in chair No resp difficulty HEENT: normal Neck: supple. JVP to jaw Carotids 2+ bilat; no bruits. No lymphadenopathy or thryomegaly appreciated. Cor: PMI nondisplaced. Tachy regular . No rubs, gallops or murmurs. Lungs: clear Abdomen: soft, nontender, + distended. No hepatosplenomegaly. No bruits or masses. Good bowel sounds. Extremities: no cyanosis, clubbing, rash, 3+ edema Neuro: alert & orientedx3, cranial nerves grossly intact. moves all 4  extremities w/o difficulty. Affect pleasant  We gave her IV lasix and metolazone in clinic with nearly 1L of clear urine out. Agree with med adjustments as above. Will see back in 2 days. If not improving will need admission. Paramedicine to follow. Eventually will need R/L cath.   Total time spent 45 minutes. Over half that time spent discussing above.  Glori Bickers, MD  5:09 PM

## 2021-02-12 ENCOUNTER — Encounter (HOSPITAL_COMMUNITY): Payer: Self-pay

## 2021-02-12 ENCOUNTER — Inpatient Hospital Stay (HOSPITAL_COMMUNITY)
Admission: AD | Admit: 2021-02-12 | Payer: BLUE CROSS/BLUE SHIELD | Source: Ambulatory Visit | Admitting: Internal Medicine

## 2021-02-12 ENCOUNTER — Ambulatory Visit (HOSPITAL_COMMUNITY)
Admission: RE | Admit: 2021-02-12 | Discharge: 2021-02-12 | Disposition: A | Discharge status: Home / Self Care | Payer: BLUE CROSS/BLUE SHIELD | Source: Ambulatory Visit | Attending: Family Medicine | Admitting: Family Medicine

## 2021-02-12 ENCOUNTER — Other Ambulatory Visit: Payer: Self-pay

## 2021-02-12 ENCOUNTER — Ambulatory Visit: Payer: BLUE CROSS/BLUE SHIELD | Admitting: Cardiology

## 2021-02-12 ENCOUNTER — Inpatient Hospital Stay: Payer: Medicaid Other | Admitting: Internal Medicine

## 2021-02-12 VITALS — BP 230/120 | HR 91 | Wt 162.2 lb

## 2021-02-12 DIAGNOSIS — I5042 Chronic combined systolic (congestive) and diastolic (congestive) heart failure: Secondary | ICD-10-CM | POA: Insufficient documentation

## 2021-02-12 DIAGNOSIS — N1832 Chronic kidney disease, stage 3b: Secondary | ICD-10-CM | POA: Insufficient documentation

## 2021-02-12 DIAGNOSIS — I313 Pericardial effusion (noninflammatory): Secondary | ICD-10-CM | POA: Insufficient documentation

## 2021-02-12 DIAGNOSIS — Z79899 Other long term (current) drug therapy: Secondary | ICD-10-CM | POA: Diagnosis not present

## 2021-02-12 DIAGNOSIS — I130 Hypertensive heart and chronic kidney disease with heart failure and stage 1 through stage 4 chronic kidney disease, or unspecified chronic kidney disease: Secondary | ICD-10-CM | POA: Insufficient documentation

## 2021-02-12 DIAGNOSIS — D509 Iron deficiency anemia, unspecified: Secondary | ICD-10-CM | POA: Diagnosis not present

## 2021-02-12 DIAGNOSIS — N921 Excessive and frequent menstruation with irregular cycle: Secondary | ICD-10-CM | POA: Insufficient documentation

## 2021-02-12 DIAGNOSIS — Z597 Insufficient social insurance and welfare support: Secondary | ICD-10-CM | POA: Insufficient documentation

## 2021-02-12 DIAGNOSIS — I5082 Biventricular heart failure: Secondary | ICD-10-CM | POA: Insufficient documentation

## 2021-02-12 DIAGNOSIS — Z596 Low income: Secondary | ICD-10-CM | POA: Diagnosis not present

## 2021-02-12 DIAGNOSIS — I5043 Acute on chronic combined systolic (congestive) and diastolic (congestive) heart failure: Secondary | ICD-10-CM | POA: Diagnosis not present

## 2021-02-12 DIAGNOSIS — I471 Supraventricular tachycardia: Secondary | ICD-10-CM | POA: Insufficient documentation

## 2021-02-12 DIAGNOSIS — Z87891 Personal history of nicotine dependence: Secondary | ICD-10-CM | POA: Diagnosis not present

## 2021-02-12 DIAGNOSIS — Z8673 Personal history of transient ischemic attack (TIA), and cerebral infarction without residual deficits: Secondary | ICD-10-CM | POA: Insufficient documentation

## 2021-02-12 DIAGNOSIS — I160 Hypertensive urgency: Secondary | ICD-10-CM | POA: Diagnosis not present

## 2021-02-12 DIAGNOSIS — I5021 Acute systolic (congestive) heart failure: Secondary | ICD-10-CM | POA: Diagnosis not present

## 2021-02-12 DIAGNOSIS — F489 Nonpsychotic mental disorder, unspecified: Secondary | ICD-10-CM | POA: Insufficient documentation

## 2021-02-12 DIAGNOSIS — I7090 Unspecified atherosclerosis: Secondary | ICD-10-CM | POA: Diagnosis not present

## 2021-02-12 DIAGNOSIS — D259 Leiomyoma of uterus, unspecified: Secondary | ICD-10-CM | POA: Insufficient documentation

## 2021-02-12 LAB — CBC
HCT: 29.6 % — ABNORMAL LOW (ref 36.0–46.0)
Hemoglobin: 8.6 g/dL — ABNORMAL LOW (ref 12.0–15.0)
MCH: 22.5 pg — ABNORMAL LOW (ref 26.0–34.0)
MCHC: 29.1 g/dL — ABNORMAL LOW (ref 30.0–36.0)
MCV: 77.5 fL — ABNORMAL LOW (ref 80.0–100.0)
Platelets: 328 10*3/uL (ref 150–400)
RBC: 3.82 MIL/uL — ABNORMAL LOW (ref 3.87–5.11)
WBC: 8.4 10*3/uL (ref 4.0–10.5)
nRBC: 0 % (ref 0.0–0.2)

## 2021-02-12 LAB — COMPREHENSIVE METABOLIC PANEL
ALT: 24 U/L (ref 0–44)
AST: 24 U/L (ref 15–41)
Albumin: 3.1 g/dL — ABNORMAL LOW (ref 3.5–5.0)
Alkaline Phosphatase: 116 U/L (ref 38–126)
Anion gap: 10 (ref 5–15)
BUN: 21 mg/dL — ABNORMAL HIGH (ref 6–20)
CO2: 21 mmol/L — ABNORMAL LOW (ref 22–32)
Calcium: 9.4 mg/dL (ref 8.9–10.3)
Chloride: 108 mmol/L (ref 98–111)
Creatinine, Ser: 1.17 mg/dL — ABNORMAL HIGH (ref 0.44–1.00)
GFR, Estimated: 58 mL/min — ABNORMAL LOW (ref >60)
Glucose, Bld: 91 mg/dL (ref 70–99)
Potassium: 4.4 mmol/L (ref 3.5–5.1)
Sodium: 139 mmol/L (ref 135–145)
Total Bilirubin: 0.6 mg/dL (ref 0.3–1.2)
Total Protein: 7.1 g/dL (ref 6.5–8.1)

## 2021-02-12 LAB — BRAIN NATRIURETIC PEPTIDE: B Natriuretic Peptide: >4500 pg/mL — ABNORMAL HIGH (ref 0.0–100.0)

## 2021-02-12 MED ORDER — POTASSIUM CHLORIDE CRYS ER 20 MEQ PO TBCR: 20.0000 meq | EXTENDED_RELEASE_TABLET | Freq: Two times a day (BID) | ORAL | 3 refills | Status: DC

## 2021-02-12 MED ORDER — HYDRALAZINE HCL 100 MG PO TABS
100.0000 mg | ORAL_TABLET | Freq: Three times a day (TID) | ORAL | 11 refills | Status: DC
Start: 2021-02-12 — End: 2021-02-14

## 2021-02-12 MED ORDER — ENTRESTO 49-51 MG PO TABS: 1.0000 | ORAL_TABLET | Freq: Two times a day (BID) | ORAL | 6 refills | Status: DC

## 2021-02-12 MED ORDER — METOLAZONE 2.5 MG PO TABS
2.5000 mg | ORAL_TABLET | ORAL | 3 refills | Status: DC | PRN
Start: 2021-02-12 — End: 2021-02-14

## 2021-02-12 MED ORDER — FUROSEMIDE 10 MG/ML IJ SOLN: 80.0000 mg | Freq: Once | INTRAMUSCULAR | Status: DC

## 2021-02-12 MED ORDER — METOLAZONE 2.5 MG PO TABS
2.5000 mg | ORAL_TABLET | Freq: Once | ORAL | Status: AC
  Administered 2021-02-12: 2.5 mg via ORAL

## 2021-02-12 MED ORDER — TORSEMIDE 20 MG PO TABS: 40.0000 mg | ORAL_TABLET | Freq: Two times a day (BID) | ORAL | 3 refills | Status: DC

## 2021-02-12 MED ORDER — FUROSEMIDE 10 MG/ML IJ SOLN
80.0000 mg | Freq: Once | INTRAMUSCULAR | Status: AC
  Administered 2021-02-12: 80 mg via INTRAVENOUS

## 2021-02-12 MED ORDER — HYDRALAZINE HCL 50 MG PO TABS
50.0000 mg | ORAL_TABLET | Freq: Once | ORAL | Status: AC
  Administered 2021-02-12: 50 mg via ORAL

## 2021-02-12 MED ORDER — POTASSIUM CHLORIDE CRYS ER 20 MEQ PO TBCR
40.0000 meq | EXTENDED_RELEASE_TABLET | Freq: Once | ORAL | Status: AC
  Administered 2021-02-12: 40 meq via ORAL

## 2021-02-12 MED ORDER — POTASSIUM CHLORIDE CRYS ER 20 MEQ PO TBCR: 40.0000 meq | EXTENDED_RELEASE_TABLET | Freq: Two times a day (BID) | ORAL | 3 refills | Status: DC

## 2021-02-12 NOTE — Progress Notes (Signed)
IV d/c'd catheter intact, salt and fluid restrictions reviewed with pt, D/C instructions reviewed and f/u appt scheduled

## 2021-02-12 NOTE — Progress Notes (Signed)
ReDS Vest / Clip - 02/12/21 1200      ReDS Vest / Clip   Station Marker A    Ruler Value 28    ReDS Value Range Low volume    ReDS Actual Value 25

## 2021-02-12 NOTE — Patient Instructions (Addendum)
START Torsemide 40 mg twice day START Metolazone 2.5 mg, one tab daily for 2 days START Potassium 20 meq daily  START Entresto 49/51 mg, one tab twice daily INCREASE Hydralazine to 100 mg, one tab three times daily  Labs today We will only contact you if something comes back abnormal or we need to make some changes. Otherwise no news is good news!  Your physician recommends that you schedule a follow-up appointment in: 2 days  At the [facility], you and your health needs are our priority. As part of our continuing mission to provide you with exceptional heart care, we have created designated Provider Care Teams. These Care Teams include your primary Cardiologist (physician) and Advanced Practice Providers (APPs- Physician Assistants and Nurse Practitioners) who all work together to provide you with the care you need, when you need it.   You may see any of the following providers on your designated Care Team at your next follow up: Marland Kitchen Dr Glori Bickers . Dr Loralie Champagne . Dr Vickki Muff . Darrick Grinder, NP . Lyda Jester, [place] . Audry Riles, PharmD   Please be sure to bring in all your medications bottles to every appointment.

## 2021-02-12 NOTE — Addendum Note (Signed)
Encounter addended by: Jolaine Artist, MD on: 02/12/2021 5:10 PM  Actions taken: Level of Service modified, Visit diagnoses modified

## 2021-02-12 NOTE — Progress Notes (Signed)
Late Entry: IV started in L forearm by IV team, administered 80 mg IV Lasix, Metolazone 2.5 mg PO, KCL 40 meq PO, and Hydralazine 50 mg PO  Pt tolerated all well  Pt voided 800 cc urine

## 2021-02-13 NOTE — Progress Notes (Signed)
Advanced Heart Failure Consult Note  PCP: No PCP Primary Cardiologist: Dr. Johney Frame HF Cardiologist: Dr. Haroldine Laws  HPI:  Holly Bradley is a 45 y.o.  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  The majority of patient's prior medical history centers around severe uterine fibroids and bleeding, also poorly controlled HTN and tobacco use. Referred to IR for embolization procedure to improve fibroid bleeding 2019 most visits she has been severely hypertensive.    Admitted in May 2021 with SVT which apparently had been diagnosed before in 2019 according to the patient. Occurred in the setting of a hgb of 4.4 due to ongoing uterine bleeding.  Additionally she was found to be in low EF CHF, secondary to SVT, marked anemia, and  poorly controlled HTN.  She was given adenosine and PRBCs she converted to NSR, placed on a beta blocker, received a minimal amount of IV lasix and was discharged..  Additionally, then added an ACEI for bp and restarted her nifedipine.   Admitted 12/2020 for dyspnea, palpitations in SVT, which terminated with adenosine.  Also hypertensive with AKI. Responded to IV lasix with improvement in renal function, transitioned to oral lasix, but renal function worsened and lasix held.  Hospitalization c/b episodes of NSVT. She was started on Imdur, Toprol XL, & amiodarone.  ED visit on 01/1021 admitted for one day by cardiology for another episode of SVT, HTN.  She spontaneously converted to NSR.  She was discharged on increased dose of Imdur 60 and hydral 50 tid.     Today she returns for HF follow up. Seen in clinic 2 days ago, referred from [facility] clinic, and was massively volume overloaded, weight up 19 lbs in 1 week. Given 80 mg IV Lasix in clinic + metolazone 2.5 mg daily x 3 days. Daily torsemide increased to 40 mg bid. Today, weight down 5  lbs. Still SOB, now having fluttering in chest. Denies increasing CP, dizziness; + PND/Orthopnea. Appetite ok. No fever or chills. Weight at home ~156 pounds. Taking all medications.   Review of Systems: All systems negative except as documented in the HPI and problem list.    Past Medical History:  Diagnosis Date  . Chronic blood loss anemia    Related to uterine fibroids  . Essential hypertension    Poorly controlled, repeat by report only on nicardipine 90 mg  . Fibroid    Likely complicated by by menometrorrhagia and chronic anemia  . Paroxysmal SVT (supraventricular tachycardia) (HCC)    Frequent episodes, can last anywhere from 20 minutes to 12-15 hours.  Not on beta-blocker or calcium channel blocker    SH:  Social History   Socioeconomic History  . Marital status: Single    Spouse name: Not on file  . Number of children: Not on file  . Years of education: Not on file  . Highest education level: Not on file  Occupational History  . Not on file  Tobacco Use  . Smoking status: Current Some Day Smoker  . Smokeless tobacco: Current User  Substance and Sexual Activity  . Alcohol use: Never  . Drug use: Not Currently    Types: Marijuana  . Sexual activity: Not on file  Other Topics Concern  . Not on file  Social History Narrative  . Not on file   Social Determinants of Health   Financial Resource Strain: High Risk  . Difficulty  of Paying Living Expenses: Very hard  Food Insecurity: No Food Insecurity  . Worried About Charity fundraiser in the Last Year: Never true  . Ran Out of Food in the Last Year: Never true  Transportation Needs: Unmet Transportation Needs  . Lack of Transportation (Medical): Yes  . Lack of Transportation (Non-Medical): Yes  Physical Activity: Not on file  Stress: Not on file  Social Connections: Not on file  Intimate Partner Violence: Not on file   FH:  Family History  Family history unknown: Yes   Past Medical History:  Diagnosis  Date  . Chronic blood loss anemia    Related to uterine fibroids  . Essential hypertension    Poorly controlled, repeat by report only on nicardipine 90 mg  . Fibroid    Likely complicated by by menometrorrhagia and chronic anemia  . Paroxysmal SVT (supraventricular tachycardia) (HCC)    Frequent episodes, can last anywhere from 20 minutes to 12-15 hours.  Not on beta-blocker or calcium channel blocker    Current Outpatient Medications  Medication Sig Dispense Refill  . amiodarone (PACERONE) 200 MG tablet Take 1 tablet (200 mg total) by mouth daily. 30 tablet 11  . ARIPiprazole (ABILIFY) 2 MG tablet Take 1 tablet (2 mg total) by mouth at bedtime. 30 tablet 11  . hydrALAZINE (APRESOLINE) 50 MG tablet Take 100 mg by mouth 3 (three) times daily.    . isosorbide mononitrate (IMDUR) 60 MG 24 hr tablet Take 1 tablet (60 mg total) by mouth daily. 30 tablet 11  . metoprolol (TOPROL-XL) 200 MG 24 hr tablet Take 1 tablet (200 mg total) by mouth daily. Take with or immediately following a meal. 30 tablet 11  . sacubitril-valsartan (ENTRESTO) 49-51 MG Take 1 tablet by mouth 2 (two) times daily. 60 tablet 6  . torsemide (DEMADEX) 20 MG tablet Take 2 tablets (40 mg total) by mouth 2 (two) times daily. 120 tablet 3  . [START ON 02/15/2021] metolazone (ZAROXOLYN) 2.5 MG tablet Take 1 tablet (2.5 mg total) by mouth 2 (two) times a week. Monday and Friday 5 tablet 3  . potassium chloride SA (KLOR-CON) 20 MEQ tablet Take 1 tablet (20 mEq total) by mouth 2 (two) times daily. With additional 40 meq with Metolazone (Mon/Fr) 75 tablet 3   No current facility-administered medications for this encounter.    Vitals:   02/14/21 0828  BP: (!) 178/80  Pulse: 81  SpO2: 100%  Weight: 71.3 kg (157 lb 3.2 oz)   Wt Readings from Last 3 Encounters:  02/14/21 71.3 kg (157 lb 3.2 oz)  02/12/21 73.6 kg (162 lb 3.2 oz)  02/05/21 64.9 kg (143 lb)   PHYSICAL EXAM: General:  NAD. No resp difficulty. HEENT:  Normal Neck: Supple. JVP 8-9. Carotids 2+ bilat; no bruits. No lymphadenopathy or thryomegaly appreciated. Cor: PMI nondisplaced. Regular rate & rhythm. No rubs, gallops or murmurs, +s3. Lungs: Clear Abdomen: nontender, + distended. No hepatosplenomegaly. No bruits or masses. Good bowel sounds. Extremities: No cyanosis, clubbing, rash, 2-3+ LE edema Neuro: Alert & oriented x 3, cranial nerves grossly intact. Moves all 4 extremities w/o difficulty. Affect pleasant.  Reds: 36%  ECG: SR, LVH 80 bpm, no ectopy (personally reviewed).  ASSESSMENT & PLAN: 1. Chronic Combined Systolic and Diastolic CHF, Biventricular CHF - Long standing poorly controlled HTN, Atherosclerosis, SVT. - Echo (2/22) EF down to 20-25% compared to (2019) EF 40-45%, stable moderate pericardial effusion. - Concern forTTR amyloid given degree of hypertrophy on echo. -  Will need cMRI, Multiple Myeloma panel, based on initial results Nuclear PYP scan. - Will need R/LHC. - NYHA Class IIIb symptoms, volume still up on exam, Reds 36%. - Continue torsemide to 40 mg bid + KCl 40 mEq dialy. - Will take metolazone 5 mg daily only on Mondays and Fridays, along with extra 40 mEq of KCl. - Continue Entresto 49/51 mg bid.  - Continue Toprol XL 200 mg daily. - Consider addition of spiro & SGLT2i soon. - BMET & BNP today.  2. Paroxysmal SVT - SR on ECG today. - Continue amiodarone 200 mg daily & beta blocker. - Needs EP follow up.  - TSH ok (2/22).  3. HTN - Elevated today, but better. - Continue hydralazine to 100 mg tid + Imdur 60 daily.  4. CKD 3b - Baseline SCr ~ 1.5-1.7. - BMET today  5. Iron Deficiency Anemia, 2/2 uterine fibroids  - Needs definitive management by OB/GYN, was set up for ablation by IR in [place] however this was never completed. - Follow up with GYN, may need IR embolization. - Likely will need Feraheme infusions soon. - hgb 8.6, MCV 77.5, MCH 22.5 (02/12/21) - Check ferratin & TIBC.  6.  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 - Had paranoia/psychosis in Jan 2022 admission; started on Abilify but never received psychiatric care. - Will need neurology follow up going forward (? organic brain disease).  7. Tobacco Use: - Started back smoking, 2 cigs/day. - Encouraged cessation.  8. SDOH: - HFSW assisting her with disability and medicaid. - Will set up w/ Paramedicine. - She has BP cuff and scale at home.  - Follow back in clinic next week to assess volume status.  - Will defer arrangement for ischemic work up and cMRI, refer to EP until diuresed.  Allena Katz, FNP-BC 02/14/21   Patient seen and examined with the above-signed Advanced Practice Provider and/or Housestaff. I personally reviewed laboratory data, imaging studies and relevant notes. I independently examined the patient and formulated the important aspects of the plan. I have edited the note to reflect any of my changes or salient points. I have personally discussed the plan with the patient and/or family.  Volume status improving on current diuretic regimen. REDs down to 36%. Still quite symptomatic NYHA III-IIIb.  General:  Sitting on exam table. No resp difficulty HEENT: normal Neck: supple. no JVD. Carotids 2+ bilat; no bruits. No lymphadenopathy or thryomegaly appreciated. Cor: PMI nondisplaced. Regular rate & rhythm. No rubs, gallops or murmurs. Lungs: clear Abdomen: soft, nontender, nondistended. No hepatosplenomegaly. No bruits or masses. Good bowel sounds. Extremities: no cyanosis, clubbing, rash, edema Neuro: alert & orientedx3, cranial nerves grossly intact. moves all 4 extremities w/o difficulty. Affect pleasant  Volume status improved. Remains quite symptomatic. Agree with changes in diuretic regimen as above. Arrange R/L cath. Will need cMRI soon as well. Arrange for  Paramedicine f/u. May need IV iron.   Glori Bickers, MD  9:26 PM

## 2021-02-14 ENCOUNTER — Other Ambulatory Visit: Payer: Self-pay

## 2021-02-14 ENCOUNTER — Ambulatory Visit (HOSPITAL_COMMUNITY)
Admission: RE | Admit: 2021-02-14 | Discharge: 2021-02-14 | Disposition: A | Discharge status: Home / Self Care | Payer: BLUE CROSS/BLUE SHIELD | Source: Ambulatory Visit | Attending: Family Medicine | Admitting: Family Medicine

## 2021-02-14 ENCOUNTER — Encounter (HOSPITAL_COMMUNITY): Payer: Self-pay

## 2021-02-14 VITALS — BP 178/80 | HR 81 | Wt 157.2 lb

## 2021-02-14 DIAGNOSIS — I5042 Chronic combined systolic (congestive) and diastolic (congestive) heart failure: Secondary | ICD-10-CM | POA: Diagnosis not present

## 2021-02-14 DIAGNOSIS — I498 Other specified cardiac arrhythmias: Secondary | ICD-10-CM | POA: Diagnosis present

## 2021-02-14 DIAGNOSIS — I471 Supraventricular tachycardia: Secondary | ICD-10-CM | POA: Diagnosis not present

## 2021-02-14 DIAGNOSIS — Z720 Tobacco use: Secondary | ICD-10-CM | POA: Diagnosis not present

## 2021-02-14 DIAGNOSIS — N921 Excessive and frequent menstruation with irregular cycle: Secondary | ICD-10-CM

## 2021-02-14 LAB — BASIC METABOLIC PANEL
Anion gap: 11 (ref 5–15)
BUN: 25 mg/dL — ABNORMAL HIGH (ref 6–20)
CO2: 23 mmol/L (ref 22–32)
Calcium: 9.4 mg/dL (ref 8.9–10.3)
Chloride: 106 mmol/L (ref 98–111)
Creatinine, Ser: 1.22 mg/dL — ABNORMAL HIGH (ref 0.44–1.00)
GFR, Estimated: 55 mL/min — ABNORMAL LOW (ref >60)
Glucose, Bld: 119 mg/dL — ABNORMAL HIGH (ref 70–99)
Potassium: 3.5 mmol/L (ref 3.5–5.1)
Sodium: 140 mmol/L (ref 135–145)

## 2021-02-14 LAB — IRON AND TIBC
Iron: 21 ug/dL — ABNORMAL LOW (ref 28–170)
Saturation Ratios: 7 % — ABNORMAL LOW (ref 10.4–31.8)
TIBC: 288 ug/dL (ref 250–450)
UIBC: 267 ug/dL

## 2021-02-14 LAB — FERRITIN: Ferritin: 56 ng/mL (ref 11–307)

## 2021-02-14 LAB — BRAIN NATRIURETIC PEPTIDE: B Natriuretic Peptide: 3336.8 pg/mL — ABNORMAL HIGH (ref 0.0–100.0)

## 2021-02-14 MED ORDER — METOLAZONE 2.5 MG PO TABS: 2.5000 mg | ORAL_TABLET | ORAL | 3 refills | Status: DC

## 2021-02-14 MED ORDER — POTASSIUM CHLORIDE CRYS ER 20 MEQ PO TBCR: 20.0000 meq | EXTENDED_RELEASE_TABLET | Freq: Two times a day (BID) | ORAL | 3 refills | Status: DC

## 2021-02-14 NOTE — Patient Instructions (Signed)
CHANGE Metolazone 2.5 mg, one tab twice a week on Mondays and Fridays Take an ADDITIONAL  40 meq with every dose of Metolazone    Labs today We will only contact you if something comes back abnormal or we need to make some changes. Otherwise no news is good news!  Your physician recommends that you schedule a follow-up appointment in: 5-6 days  in the Advanced Practitioners (PA/NP) Clinic    Do the following things EVERYDAY: 1) Weigh yourself in the morning before breakfast. Write it down and keep it in a log. 2) Take your medicines as prescribed 3) Eat low salt foods--Limit salt (sodium) to 2000 mg per day.  4) Stay as active as you can everyday 5) Limit all fluids for the day to less than 2 liters  At the [facility], you and your health needs are our priority. As part of our continuing mission to provide you with exceptional heart care, we have created designated Provider Care Teams. These Care Teams include your primary Cardiologist (physician) and Advanced Practice Providers (APPs- Physician Assistants and Nurse Practitioners) who all work together to provide you with the care you need, when you need it.   You may see any of the following providers on your designated Care Team at your next follow up: Marland Kitchen Dr Glori Bickers . Dr Loralie Champagne . Dr Vickki Muff . Darrick Grinder, NP . Lyda Jester, [place] . Audry Riles, PharmD   Please be sure to bring in all your medications bottles to every appointment.    If you have any questions or concerns before your next appointment please send [place] a message through [place] or call our office at [phone].    TO LEAVE A MESSAGE FOR THE NURSE SELECT OPTION 2, PLEASE LEAVE A MESSAGE INCLUDING: . YOUR NAME . DATE OF BIRTH . CALL BACK NUMBER . REASON FOR CALL**this is important as we prioritize the call backs  YOU WILL RECEIVE A CALL BACK THE SAME DAY AS LONG AS YOU CALL BEFORE 4:00 PM

## 2021-02-14 NOTE — Progress Notes (Signed)
Pt already enrolled in paramedicine last week but CSW met with pt to complete paramedicine initial assessment and follow up regarding past conversations.  Paramedicine Initial Assessment:  Housing:  In what kind of housing do you live? House/apt/trailer/shelter? Currently in an apartment but planning to move into a house this Friday.  Do you rent/pay a mortgage/own? rent  Do you live with anyone? brother  Are you currently worried about losing your housing? no  Within the past 12 months have you ever stayed outside, in a car, tent, a shelter, or temporarily with someone? no  Within the past 12 months have you been unable to get utilities when it was really needed? No- but reports sometimes its close  Social:  What is your current marital status? single  Do you have any children? no  Do you have family or friends who live locally? No one other than her brother  Food:  Within the past 12 months were you ever worried that food would run out before you got money to buy more? Yes- sometimes worried but always able to get food  Within the past 36month have you run out of food and didn't have money to buy more? no  CSW inquired about how [employer] was going and pt reports that has been extrememely helpful.  [facility]   Income:  What is your current source of income? Was working full time up until a month ago  How hard is it for you to pay for the basics like food housing, medical care, and utilities? Somewhat hard  Do you have outstanding medical bills? no  Insurance:  Are you currently insured[place]  Do you [place]  If no insurance, have you applied for coverage (Medicaid, disability, marketplace etc)?  Transportation:  Do you have transportation to your medical appointments? Does now- has been set up with [facility]  but was a concern in the past    Daily Health Needs: Do you have a working scale at home? Yes- was given by the clinic  How do you manage your medications at home? Pill packs that was set up by the pharmacy [facility]  Do you have issues affording your medications? Unsure how much she'll have to pay once insurance is applied  Do you have any concerns with mobility at home? No able to complete ADLs independently  Do you use any assistive devices at home or have PCS at home? no  Do you have a PCP? No but trying to establish with [facility] but had to miss appt Monday due to being fluid overloaded- [facility].  Do you have any trouble reading or writing? no  Are there any additional barriers you see to getting the care you need?  [facility] [place] [facility]  Said she spoke with case worker last week and there were some issues that she didn't understand.  Pt to find case workers number and provide it to CShirleyso I can assist in following up.  CSW will continue to follow through paramedicine program and assist as needed.  JJorge Ny LCSW Clinical Social Worker Advanced Heart Failure Clinic Desk#: [phone]#: [phone]

## 2021-02-15 ENCOUNTER — Other Ambulatory Visit (HOSPITAL_COMMUNITY): Payer: Self-pay

## 2021-02-15 NOTE — Progress Notes (Signed)
Came out today for med rec.  Pt is in process of packing and moving tomorrow.  She lives here with her brother.  Pt wants to stay with internal med PCP.   With her [place] she completed the application-[employer] was the one from the [place] office in [place]. She will look for a number for that and get back to me.  She has been taking 100mg  of hydralazine plus what is in the bubble packs of the 50mg  TID.  She did have a fall today and have had h/a past few days.  No injuries from the fall.  Made pill box for her out of her pill bottles from [facility] and her bubble packs. I will f/u with her next week.  She wants all her meds to come from [place]. I will let pharmacy know to hold off on her bubble packs UFN.   [name], EMT -Paramedic 02/15/21

## 2021-02-16 ENCOUNTER — Telehealth (HOSPITAL_COMMUNITY): Payer: Self-pay | Admitting: Licensed Clinical Social Worker

## 2021-02-16 NOTE — Telephone Encounter (Signed)
CSW saw pt had missed CHW appt due to almost being admitted to the hospital so was working on getting her another appt.  Pt used to be seen at [facility] clinic so CSW called them and made new pt appt for 3/10 with pt permission.  Pt informed of appt date and time  Will continue to follow and assist as needed  Jorge Ny, [place] [facility] Desk#: [phone] Cell#: [phone]

## 2021-02-20 ENCOUNTER — Other Ambulatory Visit (HOSPITAL_COMMUNITY): Payer: Self-pay

## 2021-02-20 ENCOUNTER — Ambulatory Visit (HOSPITAL_COMMUNITY)
Admission: RE | Admit: 2021-02-20 | Discharge: 2021-02-20 | Disposition: A | Discharge status: Home / Self Care | Payer: BLUE CROSS/BLUE SHIELD | Source: Ambulatory Visit | Attending: Family Medicine | Admitting: Family Medicine

## 2021-02-20 ENCOUNTER — Other Ambulatory Visit: Payer: Self-pay

## 2021-02-20 ENCOUNTER — Encounter (HOSPITAL_COMMUNITY): Payer: Self-pay

## 2021-02-20 ENCOUNTER — Telehealth (HOSPITAL_COMMUNITY): Payer: Self-pay

## 2021-02-20 VITALS — BP 156/97 | HR 79 | Wt 153.2 lb

## 2021-02-20 DIAGNOSIS — Z79899 Other long term (current) drug therapy: Secondary | ICD-10-CM | POA: Insufficient documentation

## 2021-02-20 DIAGNOSIS — I313 Pericardial effusion (noninflammatory): Secondary | ICD-10-CM | POA: Insufficient documentation

## 2021-02-20 DIAGNOSIS — F489 Nonpsychotic mental disorder, unspecified: Secondary | ICD-10-CM | POA: Insufficient documentation

## 2021-02-20 DIAGNOSIS — I471 Supraventricular tachycardia: Secondary | ICD-10-CM | POA: Insufficient documentation

## 2021-02-20 DIAGNOSIS — N920 Excessive and frequent menstruation with regular cycle: Secondary | ICD-10-CM | POA: Insufficient documentation

## 2021-02-20 DIAGNOSIS — I5082 Biventricular heart failure: Secondary | ICD-10-CM | POA: Diagnosis not present

## 2021-02-20 DIAGNOSIS — D509 Iron deficiency anemia, unspecified: Secondary | ICD-10-CM | POA: Insufficient documentation

## 2021-02-20 DIAGNOSIS — Z87891 Personal history of nicotine dependence: Secondary | ICD-10-CM | POA: Insufficient documentation

## 2021-02-20 DIAGNOSIS — I130 Hypertensive heart and chronic kidney disease with heart failure and stage 1 through stage 4 chronic kidney disease, or unspecified chronic kidney disease: Secondary | ICD-10-CM | POA: Insufficient documentation

## 2021-02-20 DIAGNOSIS — N1832 Chronic kidney disease, stage 3b: Secondary | ICD-10-CM | POA: Diagnosis not present

## 2021-02-20 DIAGNOSIS — I5042 Chronic combined systolic (congestive) and diastolic (congestive) heart failure: Secondary | ICD-10-CM | POA: Insufficient documentation

## 2021-02-20 DIAGNOSIS — Z86018 Personal history of other benign neoplasm: Secondary | ICD-10-CM | POA: Insufficient documentation

## 2021-02-20 LAB — CBC
HCT: 29.9 % — ABNORMAL LOW (ref 36.0–46.0)
Hemoglobin: 8.8 g/dL — ABNORMAL LOW (ref 12.0–15.0)
MCH: 23 pg — ABNORMAL LOW (ref 26.0–34.0)
MCHC: 29.4 g/dL — ABNORMAL LOW (ref 30.0–36.0)
MCV: 78.1 fL — ABNORMAL LOW (ref 80.0–100.0)
Platelets: 380 10*3/uL (ref 150–400)
RBC: 3.83 MIL/uL — ABNORMAL LOW (ref 3.87–5.11)
WBC: 6.3 10*3/uL (ref 4.0–10.5)
nRBC: 0 % (ref 0.0–0.2)

## 2021-02-20 LAB — BASIC METABOLIC PANEL
Anion gap: 11 (ref 5–15)
BUN: 31 mg/dL — ABNORMAL HIGH (ref 6–20)
CO2: 26 mmol/L (ref 22–32)
Calcium: 9.5 mg/dL (ref 8.9–10.3)
Chloride: 104 mmol/L (ref 98–111)
Creatinine, Ser: 1.25 mg/dL — ABNORMAL HIGH (ref 0.44–1.00)
GFR, Estimated: 54 mL/min — ABNORMAL LOW (ref >60)
Glucose, Bld: 87 mg/dL (ref 70–99)
Potassium: 3.4 mmol/L — ABNORMAL LOW (ref 3.5–5.1)
Sodium: 141 mmol/L (ref 135–145)

## 2021-02-20 LAB — BRAIN NATRIURETIC PEPTIDE: B Natriuretic Peptide: 3032.5 pg/mL — ABNORMAL HIGH (ref 0.0–100.0)

## 2021-02-20 MED ORDER — ENTRESTO 97-103 MG PO TABS: 1.0000 | ORAL_TABLET | Freq: Two times a day (BID) | ORAL | 3 refills | Status: DC

## 2021-02-20 MED ORDER — FERROUS SULFATE 325 (65 FE) MG PO TABS: 325.0000 mg | ORAL_TABLET | Freq: Two times a day (BID) | ORAL | 3 refills | Status: AC

## 2021-02-20 NOTE — Patient Instructions (Addendum)
INCREASE Entresto to 97/103 mg, one tab twice a day Take Metolazone 2.5mg  02/21/21 with morning dose of Torsemide, then resume twice weekly dose  Labs today We will only contact you if something comes back abnormal or we need to make some changes. Otherwise no news is good news!  Please wear your compression hose daily, place them on as soon as you get up in the morning and remove before you go to bed at night.  Your physician recommends that you schedule a follow-up appointment in: 2 week  in the Advanced Practitioners (PA/NP) Clinic    If you have any questions or concerns before your next appointment please send [place] a message through [place] or call our office at [phone].    TO LEAVE A MESSAGE FOR THE NURSE SELECT OPTION 2, PLEASE LEAVE A MESSAGE INCLUDING: . YOUR NAME . DATE OF BIRTH . CALL BACK NUMBER . REASON FOR CALL**this is important as we prioritize the call backs  YOU WILL RECEIVE A CALL BACK THE SAME DAY AS LONG AS YOU CALL BEFORE 4:00 PM  Please see our updated No Show and Same Day Appointment Cancellation Policy attached to your AVS.

## 2021-02-20 NOTE — Progress Notes (Signed)
Advanced Heart Failure Progress Note   PCP: No PCP Primary Cardiologist: Dr. Johney Frame HF Cardiologist: Dr. Haroldine Laws  HPI:  Holly Bradley is a 45 y.o.  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  The majority of patient's prior medical history centers around severe uterine fibroids and bleeding, also poorly controlled HTN and tobacco use. Referred to IR for embolization procedure to improve fibroid bleeding 2019 most visits she has been severely hypertensive.    Admitted in May 2021 with SVT which apparently had been diagnosed before in 2019 according to the patient. Occurred in the setting of a hgb of 4.4 due to ongoing uterine bleeding.  Additionally she was found to be in low EF CHF, secondary to SVT, marked anemia, and  poorly controlled HTN.  She was given adenosine and PRBCs she converted to NSR, placed on a beta blocker, received a minimal amount of IV lasix and was discharged..  Additionally, then added an ACEI for bp and restarted her nifedipine.   Admitted 12/2020 for dyspnea, palpitations in SVT, which terminated with adenosine.  Also hypertensive with AKI. Responded to IV lasix with improvement in renal function, transitioned to oral lasix, but renal function worsened and lasix held.  Hospitalization c/b episodes of NSVT. She was started on Imdur, Toprol XL, & amiodarone.  ED visit on 01/1021 admitted for one day by cardiology for another episode of SVT, HTN.  She spontaneously converted to NSR.  She was discharged on increased dosage of imdur 60 and hydral 50 tid.     She was seen in the [facility] for the first time, last week, on 2/23 after being referred by the [facility] clinic. She was markedly volume overloaded and hypertensive. Weight was up 20 pounds.  She was given IV lasix and metolazone in clinic with nearly 1L of clear urine  out. She was instructed to take metolazone 2.5 mg daily x 3 days. Entresto was increased to 49-51 bid and torsemide increased to 40 mg bid.   She returns to clinic today for f/u. Here w/ paramedicine. Wt is down 9 lb but she remains volume overloaded. Breathing improved. No resting symptoms. BP remains elevated at 156/97 but markedly improved from last weeks visit. Continues w/ menorrhagia. Has new pt appt w/ PCP next week and GYN in 2 weeks. Had recent iron studies c/w IDA but not on iron supplementation. Denies syncope/ near syncope. Feels tired.    Review of Systems: [y] = yes, [ ]  = no   General: Weight gain [Y]; Weight loss [ ] ; Anorexia [ ] ; Fatigue [Y ]; Fever [ ] ; Chills [ ] ; Weakness [ ]   Cardiac: Chest pain/pressure [x ]; Resting SOB [ ] ; Exertional SOB [Y ]; Orthopnea [ ] ; Pedal Edema [Y ]; Palpitations [ ] ; Syncope [ ] ; Presyncope [ ] ; Paroxysmal nocturnal dyspnea[ ]   Pulmonary: Cough [ ] ; Wheezing[ ] ; Hemoptysis[ ] ; Sputum [ ] ; Snoring [ ]   GI: Vomiting[ ] ; Dysphagia[ ] ; Melena[ ] ; Hematochezia [ ] ; Heartburn[ ] ; Abdominal pain [ ] ; Constipation [ ] ; Diarrhea [ ] ; BRBPR [ ]   GU: Hematuria[ ] ; Dysuria [ ] ; Nocturia[ ]   Vascular: Pain in legs with walking [ ] ; Pain in feet with lying flat [ ] ; Non-healing sores [ ] ; Stroke [ ] ; TIA [ ] ; Slurred speech [ ] ;  Neuro: Headaches[ ] ; Vertigo[ ] ; Seizures[ ] ; Paresthesias[ ] ;Blurred vision [ ] ;  Diplopia [ ] ; Vision changes [ ]   Ortho/Skin: Arthritis [ ] ; Joint pain [ ] ; Muscle pain [ ] ; Joint swelling [ ] ; Back Pain [ ] ; Rash [ ]   Psych: Depression[ ] ; Anxiety[ ]   Heme: Bleeding problems [Y ]; Clotting disorders [ ] ; Anemia [ ]   Endocrine: Diabetes [ ] ; Thyroid dysfunction[ ]   SH:  Social History   Socioeconomic History  . Marital status: Single    Spouse name: Not on file  . Number of children: Not on file  . Years of education: Not on file  . Highest education level: Not on file  Occupational History  . Not on file  Tobacco Use   . Smoking status: Current Some Day Smoker  . Smokeless tobacco: Current User  Substance and Sexual Activity  . Alcohol use: Never  . Drug use: Not Currently    Types: Marijuana  . Sexual activity: Not on file  Other Topics Concern  . Not on file  Social History Narrative  . Not on file   Social Determinants of Health   Financial Resource Strain: High Risk  . Difficulty of Paying Living Expenses: Very hard  Food Insecurity: No Food Insecurity  . Worried About Charity fundraiser in the Last Year: Never true  . Ran Out of Food in the Last Year: Never true  Transportation Needs: Unmet Transportation Needs  . Lack of Transportation (Medical): Yes  . Lack of Transportation (Non-Medical): Yes  Physical Activity: Not on file  Stress: Not on file  Social Connections: Not on file  Intimate Partner Violence: Not on file   FH:  Family History  Family history unknown: Yes   Past Medical History:  Diagnosis Date  . Chronic blood loss anemia    Related to uterine fibroids  . Essential hypertension    Poorly controlled, repeat by report only on nicardipine 90 mg  . Fibroid    Likely complicated by by menometrorrhagia and chronic anemia  . Paroxysmal SVT (supraventricular tachycardia) (HCC)    Frequent episodes, can last anywhere from 20 minutes to 12-15 hours.  Not on beta-blocker or calcium channel blocker    Current Outpatient Medications  Medication Sig Dispense Refill  . amiodarone (PACERONE) 200 MG tablet Take 1 tablet (200 mg total) by mouth daily. 30 tablet 11  . ARIPiprazole (ABILIFY) 2 MG tablet Take 1 tablet (2 mg total) by mouth at bedtime. 30 tablet 11  . hydrALAZINE (APRESOLINE) 50 MG tablet Take 100 mg by mouth 3 (three) times daily.    . isosorbide mononitrate (IMDUR) 60 MG 24 hr tablet Take 1 tablet (60 mg total) by mouth daily. 30 tablet 11  . metolazone (ZAROXOLYN) 2.5 MG tablet Take 1 tablet (2.5 mg total) by mouth 2 (two) times a week. Monday and Friday 5  tablet 3  . metoprolol (TOPROL-XL) 200 MG 24 hr tablet Take 1 tablet (200 mg total) by mouth daily. Take with or immediately following a meal. 30 tablet 11  . potassium chloride SA (KLOR-CON) 20 MEQ tablet Take 1 tablet (20 mEq total) by mouth 2 (two) times daily. With additional 40 meq with Metolazone (Mon/Fr) 75 tablet 3  . sacubitril-valsartan (ENTRESTO) 49-51 MG Take 1 tablet by mouth 2 (two) times daily. 60 tablet 6  . torsemide (DEMADEX) 20 MG tablet Take 2 tablets (40 mg total) by mouth 2 (two) times daily. 120 tablet 3   No current facility-administered medications for this encounter.    Vitals:   02/20/21 1158  BP: (!) 156/97  Pulse: 79  SpO2: 100%  Weight: 69.5 kg (153 lb 3.2 oz)   Wt Readings from Last 3 Encounters:  02/20/21 69.5 kg (153 lb 3.2 oz)  02/14/21 71.3 kg (157 lb 3.2 oz)  02/12/21 73.6 kg (162 lb 3.2 oz)   PHYSICAL EXAM: General:  Well appearing. No respiratory difficulty HEENT: normal Neck: supple.  JVD elevated to jaw. Carotids 2+ bilat; no bruits. No lymphadenopathy or thyromegaly appreciated. Cor: PMI nondisplaced. Regular rate & rhythm. No rubs, gallops or murmurs. Lungs: clear Abdomen: soft, nontender, nondistended. No hepatosplenomegaly. No bruits or masses. Good bowel sounds. Extremities: no cyanosis, clubbing, rash, 1-2+ bilateral LE edema Neuro: alert & oriented x 3, cranial nerves grossly intact. moves all 4 extremities w/o difficulty. Affect pleasant.   ASSESSMENT & PLAN: 1. Chronic Combined Systolic and Diastolic CHF, Biventricular CHF - Long standing poorly controlled HTN, Atherosclerosis, SVT. - Echo (2/22) EF down to 20-25% compared to (2019) EF 40-45% . - Stable Moderate pericardial effusion. - Remains volume overloaded w/ NYHA Class III symptoms (confounded by IDA). Considered giving dose of IV Lasix today but pt just took AM dose of torsemide.  - Increase Entersto to 97-103 mg bid  - Add Spiro 12.5 mg daily  - Continue Torsemide 40 mg  bid  - Take extra dose of metolazone this week,  2.5 mg WMF, then return to twice weekly dosing next week q Mon + Friday  - I have ordered medical grade compression stockings to help w/ edema  - Continue Toprol XL 200 mg daily. - Plan SGLT2i at next visit  - Check BMP today and again in 7 days  - Continue w/ paramedicine to help w/ compliance  - Concern for amyloid given age and degree of hypertrophy on echo. - Will need cMRI once better diuresed  2. Paroxysmal SVT - Regular on exam. Denies palpitations  - Continue amiodarone 200 mg & metoprolol  - TSH ok (2/22)   3. HTN - improved today but remains moderately elevated - increase Entresto to 97-103 bid and start spiro 12.5   4. CKD 3b - Baseline SCr ~ 1.5-1.7 - BMET today and again in 7 days w/ medication adjustments   5. Iron Deficiency Anemia, 2/2 uterine fibroids  - Needs definitive management by OB/GYN, was set up for ablation by IR in [place] however this was never completed. - Has appt scheduled with GYN, may need IR embolization. - Iron studies c/w IDA - Repeat CBC today to ensure no immediate need for transfusion - start Ferrous sulfate 325 mg bid (has new pt appt w/ PCP next week, who can further manage)   6. 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 - Had paranoia/psychosis in Jan 2022 admission; started on Abilify but never received psychiatric care. - Will need outpatient neurology follow up going forward (? organic brain disease). - Has new pt appt w/ PCP next week   7. Tobacco Use: - Quit last month. - Congratulated on cessation.  F/u in 2 weeks   Lyda Jester, PA-C  12:10 PM

## 2021-02-20 NOTE — Telephone Encounter (Signed)
Pharmacy called to report with the rx of entresto that was sent in, her insurance has a large copay, so jenna was able to provide a copay card for $10 so I signed her up for that and get that to pharmacy.    [name], EMT-Paramedic  02/20/21

## 2021-02-20 NOTE — Progress Notes (Signed)
Paramedicine Encounter   Patient ID: Holly Bradley , female,   DOB: 01/10/1976,44 y.o.,  MRN: [id number]  Weight today-153 B/p-156/97 sp02-100 HR-79  Met patient in clinic today with provider.  Pt reports she fell last week-her legs gave out on her.  Pt reports she has been bleeding for the past month from her uterine fibroids. Passing big clots as well, I gave her the address of her gyno office appointment day and time.  I will have to go see her Thursday to complete another pill box.  She has appointment with DSS-they are actually going to make a home visit to discuss food stamps and disability and I advised her to ask her about the medicaid.  She also inquiring about a handicap sticker. She reports that why she fell is b/c they were parked so far away. Asked nurse about that paperwork too-she got that today. Get compression stockings--she will get rx for those.  Her entresto will be increased to 97-103 Take extra metolazone tomorrow for this week along with the extra potassium and continue to twice a week next week.  Start iron tablets BID until she sees the PCP on 10th.   [name], [place] 02/20/2021

## 2021-02-22 ENCOUNTER — Telehealth (HOSPITAL_COMMUNITY): Payer: Self-pay | Admitting: Cardiology

## 2021-02-22 ENCOUNTER — Other Ambulatory Visit (HOSPITAL_COMMUNITY): Payer: Self-pay

## 2021-02-22 ENCOUNTER — Telehealth (HOSPITAL_COMMUNITY): Payer: Self-pay | Admitting: Pharmacy Technician

## 2021-02-22 DIAGNOSIS — I5042 Chronic combined systolic (congestive) and diastolic (congestive) heart failure: Secondary | ICD-10-CM

## 2021-02-22 MED ORDER — SPIRONOLACTONE 25 MG PO TABS: 12.5000 mg | ORAL_TABLET | Freq: Every day | ORAL | 3 refills | Status: DC

## 2021-02-22 NOTE — Telephone Encounter (Signed)
I received a message that the patient's medication co-pays have significantly increased. The patient wasn't sure why the amounts had changed.  Upon further investigation, the patient's insurance company is requiring that she pay the full amount of the medication cost, also stated that the patient is to contact them. Considering the patient is not working at this time, it sounds like her insurance may have lapsed. I called the patient to discuss this with her, had to leave a message.  If the patient goes without insurance, we would start using the HF fund to cover the costs of some of her medications. Would advise patient assistance for Entresto.  Charlann Boxer, CPhT

## 2021-02-22 NOTE — Telephone Encounter (Signed)
Pt aware and voiced understanding will have repeat labs done at PCP 3*11

## 2021-02-22 NOTE — Progress Notes (Signed)
Came out today for med rec post clinic visit.  Pharmacy did not bring her meds out yet They are suppose to deliver this afternoon.   -abilify is from thurs pm to Monday pm   -amio is through Monday.   Her pill box is filled through Monday. I will see her again Monday.   -no iron yet- No spiro yet  [name], EMT-Paramedic 02/22/21

## 2021-02-22 NOTE — Telephone Encounter (Signed)
-----   Message from Consuelo Pandy, [place] sent at 02/20/2021  4:16 PM EST ----- BNP remains elevated but trending down. Continue w/ Plans to increase Entresto to 97-103 bid (should help w/ low K).  Hgb 8.8. no need for transfusion yet. Start Ferrous sulfate 325 mg bid   Repeat BMP in 1 week

## 2021-02-23 ENCOUNTER — Other Ambulatory Visit: Payer: Self-pay | Admitting: Internal Medicine

## 2021-02-26 ENCOUNTER — Other Ambulatory Visit (HOSPITAL_COMMUNITY): Payer: Self-pay

## 2021-02-26 ENCOUNTER — Telehealth (HOSPITAL_COMMUNITY): Payer: Self-pay | Admitting: Vascular Surgery

## 2021-02-26 NOTE — Telephone Encounter (Signed)
Returned pt call to resch 3/17 appt , asked pt o call back to resch

## 2021-02-26 NOTE — Progress Notes (Signed)
Paramedicine Encounter    Patient ID: Holly Bradley, female    DOB: 06-Nov-1976, 45 y.o.   MRN: [id number]   Patient Care Team: Patient, No Pcp Per as PCP - General ([place]) Holly Epley, MD as PCP - Electrophysiology (Cardiology) Holly Bergeron, MD as PCP - Cardiology (Cardiology)  Patient Active Problem List   Diagnosis Date Noted  . Food insecurity 02/02/2021  . Nausea and vomiting 01/31/2021  . Psychosis ([place])   . Fever in adult   . Delirium   . Bilateral leg edema   . Fibroid 01/11/2021  . Menorrhagia 01/11/2021  . Acute exacerbation of CHF (congestive heart failure) ([place]) 01/10/2021  . Pneumonia due to COVID-19 virus   . Acute renal failure ([place])   . Anemia   . Pelvic mass in female   . Hypertensive emergency   . SVT (supraventricular tachycardia) ([place]) 05/01/2020  . CHF (congestive heart failure) ([place]) 05/01/2020    Current Outpatient Medications:  .  ferrous sulfate 325 (65 FE) MG tablet, Take 1 tablet (325 mg total) by mouth 2 (two) times daily with a meal., Disp: 60 tablet, Rfl: 3 .  hydrALAZINE (APRESOLINE) 50 MG tablet, Take 100 mg by mouth 3 (three) times daily., Disp: , Rfl:  .  metolazone (ZAROXOLYN) 2.5 MG tablet, Take 1 tablet (2.5 mg total) by mouth 2 (two) times a week. Monday and Friday, Disp: 5 tablet, Rfl: 3 .  metoprolol (TOPROL-XL) 200 MG 24 hr tablet, Take 1 tablet (200 mg total) by mouth daily. Take with or immediately following a meal., Disp: 30 tablet, Rfl: 11 .  potassium chloride SA (KLOR-CON) 20 MEQ tablet, Take 1 tablet (20 mEq total) by mouth 2 (two) times daily. With additional 40 meq with Metolazone (Mon/Fr), Disp: 75 tablet, Rfl: 3 .  sacubitril-valsartan (ENTRESTO) 97-103 MG, Take 1 tablet by mouth 2 (two) times daily., Disp: 60 tablet, Rfl: 3 .  spironolactone (ALDACTONE) 25 MG tablet, Take 0.5 tablets (12.5 mg total) by mouth daily., Disp: 15 tablet, Rfl: 3 .  torsemide (DEMADEX) 20 MG tablet, Take 2 tablets (40 mg  total) by mouth 2 (two) times daily., Disp: 120 tablet, Rfl: 3 .  amiodarone (PACERONE) 200 MG tablet, Take 1 tablet (200 mg total) by mouth daily. (Patient not taking: Reported on 02/26/2021), Disp: 30 tablet, Rfl: 11 .  ARIPiprazole (ABILIFY) 2 MG tablet, Take 1 tablet (2 mg total) by mouth at bedtime. (Patient not taking: Reported on 02/26/2021), Disp: 30 tablet, Rfl: 11 .  isosorbide mononitrate (IMDUR) 60 MG 24 hr tablet, Take 1 tablet (60 mg total) by mouth daily. (Patient not taking: Reported on 02/26/2021), Disp: 30 tablet, Rfl: 11 No Known Allergies    Social History   Socioeconomic History  . Marital status: Single    Spouse name: Not on file  . Number of children: Not on file  . Years of education: Not on file  . Highest education level: Not on file  Occupational History  . Not on file  Tobacco Use  . Smoking status: Current Some Day Smoker  . Smokeless tobacco: Current User  Substance and Sexual Activity  . Alcohol use: Never  . Drug use: Not Currently    Types: Marijuana  . Sexual activity: Not on file  Other Topics Concern  . Not on file  Social History Narrative  . Not on file   Social Determinants of Health   Financial Resource Strain: High Risk  . Difficulty of Paying Living Expenses: Very  hard  Food Insecurity: No Food Insecurity  . Worried About Charity fundraiser in the Last Year: Never true  . Ran Out of Food in the Last Year: Never true  Transportation Needs: Unmet Transportation Needs  . Lack of Transportation (Medical): Yes  . Lack of Transportation (Non-Medical): Yes  Physical Activity: Not on file  Stress: Not on file  Social Connections: Not on file  Intimate Partner Violence: Not on file    Physical Exam      Future Appointments  Date Time Provider [place]  03/01/2021  1:45 PM Holly Hansen, MD [facility]  03/08/2021  3:35 PM Holly Basil, MD [facility]  03/09/2021  1:30 PM Holly Prows, MD [facility] None  03/20/2021 11:30  AM [facility] PA/NP [facility] None    BP (!) 200/80   Pulse 97   Resp 18   Wt 150 lb (68 kg)   LMP 02/03/2021   SpO2 99%   BMI 24.58 kg/m   Weight yesterday-150 Last visit weight-153   Pt reports she is doing ok. She is out of her amio and low on her metoprolol.  She has a higher deductible to meet and her co-pays are more than anticipated right now. They did just move apts and she is tight on money right now.  amio-$20 for 90 day supply and metoprolol is $98 for 90 day supply.  Will ask clinic to see if they can help with that.  Pharmacy will need rx of amio sent in for 90 days though.  She is out of the abilify-either $7 or $10 for that but she said she may can swing it next week.  She is out of metolazone but needs new rx sent in-the one she has is for PRN use so only had 5 tablets in there.  Metoprolol she has through sat.  Isosorbide she is out of-for 90 day supply it will be $23 meds verified and pill box filled with what she had. Will work on these other meds more in the morning if I can get assistance with the co-pays.  She had visit with social worker from [employer] [facility] but pt reports she was more focused on the food stamps and disability forms. She doesn't remember signing anything about [place] medicaid. Her name is [phone] Her b/p is very high today. She does have a slight h/a. No blurred vision. No dizziness.  She had cheeseburger for lunch and sausages for breakfast today.  She had spaghetti for supper last night and planning on eating it again tonight--I told her to eat only a small amount for supper and she agreed-she will eat more salad than the spaghetti tonight.  She is going to check her b/p tonight about an hour after taking her meds and text it to me and I will see it first thing in the morning.   ---spoke to her worker at [employer] [facility] and they are going to submit her SSI app and food stamps app tomor.  She needs to call social services to see about  moving her [place] to [place].   She would benefit from the health coach program from clinic.    Holly Bradley, [place] [facility] Paramedic  02/26/21

## 2021-02-27 ENCOUNTER — Other Ambulatory Visit (HOSPITAL_COMMUNITY): Payer: Self-pay | Admitting: *Deleted

## 2021-02-27 MED ORDER — METOLAZONE 2.5 MG PO TABS: 2.5000 mg | ORAL_TABLET | ORAL | 3 refills | Status: DC

## 2021-02-27 MED ORDER — AMIODARONE HCL 200 MG PO TABS: 200.0000 mg | ORAL_TABLET | Freq: Every day | ORAL | 3 refills | Status: DC

## 2021-03-01 ENCOUNTER — Other Ambulatory Visit: Payer: Self-pay

## 2021-03-01 ENCOUNTER — Ambulatory Visit (INDEPENDENT_AMBULATORY_CARE_PROVIDER_SITE_OTHER): Payer: Medicaid - Out of State | Admitting: Internal Medicine

## 2021-03-01 ENCOUNTER — Encounter: Payer: Self-pay | Admitting: Internal Medicine

## 2021-03-01 VITALS — BP 187/93 | HR 100 | Temp 99.2°F | Wt 152.4 lb

## 2021-03-01 DIAGNOSIS — B351 Tinea unguium: Secondary | ICD-10-CM | POA: Diagnosis not present

## 2021-03-01 DIAGNOSIS — Z1231 Encounter for screening mammogram for malignant neoplasm of breast: Secondary | ICD-10-CM | POA: Diagnosis not present

## 2021-03-01 DIAGNOSIS — D500 Iron deficiency anemia secondary to blood loss (chronic): Secondary | ICD-10-CM

## 2021-03-01 DIAGNOSIS — F28 Other psychotic disorder not due to a substance or known physiological condition: Secondary | ICD-10-CM | POA: Diagnosis not present

## 2021-03-01 DIAGNOSIS — D219 Benign neoplasm of connective and other soft tissue, unspecified: Secondary | ICD-10-CM | POA: Diagnosis not present

## 2021-03-01 DIAGNOSIS — Z0000 Encounter for general adult medical examination without abnormal findings: Secondary | ICD-10-CM | POA: Insufficient documentation

## 2021-03-01 DIAGNOSIS — I5043 Acute on chronic combined systolic (congestive) and diastolic (congestive) heart failure: Secondary | ICD-10-CM

## 2021-03-01 DIAGNOSIS — N179 Acute kidney failure, unspecified: Secondary | ICD-10-CM | POA: Diagnosis not present

## 2021-03-01 DIAGNOSIS — I471 Supraventricular tachycardia: Secondary | ICD-10-CM | POA: Diagnosis not present

## 2021-03-01 DIAGNOSIS — L600 Ingrowing nail: Secondary | ICD-10-CM | POA: Diagnosis not present

## 2021-03-01 DIAGNOSIS — D509 Iron deficiency anemia, unspecified: Secondary | ICD-10-CM | POA: Insufficient documentation

## 2021-03-01 NOTE — Assessment & Plan Note (Signed)
Baseline creatinine is around 1. She developed an AKI during her recent admission in the setting of diuresis however it has improved and is back to near her baseline.  Will check a BMP today.

## 2021-03-01 NOTE — Assessment & Plan Note (Signed)
Discussed preventative screening.  She is amenable to breast cancer screening. Discussed new guidelines for colon cancer screening. She would like to defer to a later date given all of her upcoming appointments which is understandable. She is due for cervical cancer screening with a pap smear however will defer this to GYN since she is going to see them on 3/17. She received the tetanus vaccine around 5 years ago so not due for another yet.  She had COVID in January and will be eligible for vaccination in April. Will postpone until then. Plan -mammogram ordered this encounter

## 2021-03-01 NOTE — Assessment & Plan Note (Addendum)
Follows with heart failure. Planning on referral to EP after volume status is improved. -Amiodarone, metoprolol per heart failure.

## 2021-03-01 NOTE — Assessment & Plan Note (Signed)
Nail thickening and discoloration present bilaterally.  -referral placed to podiatry

## 2021-03-01 NOTE — Assessment & Plan Note (Signed)
Continues to heavy bleeding. Suspect she will need a hysterectomy. She has an initial visit with GYN on 3/17.

## 2021-03-01 NOTE — Assessment & Plan Note (Signed)
She seems to be doing well on the abilify. Denies hallucinations or abnormal thoughts since hospitalization.  Assessment: I question if this may have been related to PRES given her severely elevated pressures this admission. Her MRI during that admission was not consistent with this but did show very severe small vessel disease given her age.  Plan -referral to psychiatry placed -continue abilify

## 2021-03-01 NOTE — Assessment & Plan Note (Signed)
Iron panel during her recent hospitalization is consistent with IDA. Likely due to blood loss 2/2 to her fibroid and heavy menses. She was recently started on iron supplementation on 3/1.  Plan -continue iron repletion with oral supplementation -Will not plan on rechecking until underlying source is resolved. Hopeful this will be soon

## 2021-03-01 NOTE — Progress Notes (Signed)
New Patient Office Visit  Subjective:  Patient ID: Holly Bradley, female    DOB: [birth year]  Age: 45 y.o. MRN: [id number]  CC: establishment of care, heart failure, hypertension, fibroid, preventative health HPI Holly Bradley is a 45 year old female with heart failure, SVTs, fibroids, and mood disorder who presents for establishment of care and hospital follow up.  Heart failure, SVT Diagnosed with paroxysmal SVT several years ago when she was residing in [place].   04/2020 admission:  -Patient was hospitalized in May 2021 for recurrent SVT and heart failure exacerbation. Her EF was noted to be 40-45% at that time with diastolic dysfunction. Unfortunately, she did not attend follow up appointments following discharge and was not adherent to her medications.   1/19-01/27/21 admission:  -Presented in SVT which responded to adenosine. BNP >4500. Repeat echocardiogram showed a new reduction in EF to 20-25%. Unfortunately, she continued to convert back to SVT and was eventually started on amiodarone by cardiology.    02/01/21 ED visit:  -Pt presented for palpitations and shortness of breath and was found to be in SVT.  -She reported being unable to fill her medications due to financial constraints.  -SVT spontaneously resolved and she was discharged on increased dose of imdur and hydralazine.   02/05/21 [facility] clinic: Seen by Dr. Shan Levans. -PYP scan ordered. toresemide 20mg  daily started.  -Arranged for heart failure at home.  -referred to EP  02/12/21: Heart failure clinic. Seen by Dr. Haroldine Laws. Worsening shortness of breath and LE edema. Blood pressure noted to be 230/120. Received one dose of 80mg  IV lasix + metalozone in the clinic.  -toresemide increased to 40mg , hydral increased to 100mg  TID -metalozone x3d -entresto started  02/20/21 Heart Failure Clinic: Blood pressure and weight improved. Symptoms improving. Entresto increased.  Today, pt notes continued improvement in  symptoms. She has mild orthopnea but otherwise denies shortness of breath with normal activity. She reports decreased episodes of palpitations. She endorses medication adherence. She is weighing herself daily.   Uterine Fibroids Pt notes that this was initially diagnosed in [place] and there were plans for her to undergo embolization. For some reason, this never happened Imaging during the January admission revealed a 23cm adenexal mass that is reported to most likely be a fibroid.  Today, she endorses ongoing heavy bleeding. We reviewed the CT images together.  Psychosis -Her hospitalization was complicated by acute psychosis including hallucinations. Psychiatry was consulted and started her on abilify. They initially planned to transfer her to inpatient psychiatry when she was medically stable however by that time, they no longer felt that she needed it and recommended outpatient follow up.  Today, she notes that her mood has been good. She denies any hallucinations or suicidal thoughts. She denies prior similar symptoms before the January admission.   Past Medical History:  Diagnosis Date  . Chronic blood loss anemia    Related to uterine fibroids  . Essential hypertension    Poorly controlled, repeat by report only on nicardipine 90 mg  . Fibroid    Likely complicated by by menometrorrhagia and chronic anemia  . Paroxysmal SVT (supraventricular tachycardia) (HCC)    Frequent episodes, can last anywhere from 20 minutes to 12-15 hours.  Not on beta-blocker or calcium channel blocker    Past Surgical History:  Procedure Laterality Date  . Unknown      Family History  Family history unknown: Yes    Social History   Socioeconomic History  . Marital status:  Single    Spouse name: Not on file  . Number of children: Not on file  . Years of education: Not on file  . Highest education level: Not on file  Occupational History  . Not on file  Tobacco Use  . Smoking status: Current  Some Day Smoker  . Smokeless tobacco: Current User  Substance and Sexual Activity  . Alcohol use: Never  . Drug use: Not Currently    Types: Marijuana  . Sexual activity: Not on file  Other Topics Concern  . Not on file  Social History Narrative  . Not on file   Social Determinants of Health   Financial Resource Strain: High Risk  . Difficulty of Paying Living Expenses: Very hard  Food Insecurity: No Food Insecurity  . Worried About Charity fundraiser in the Last Year: Never true  . Ran Out of Food in the Last Year: Never true  Transportation Needs: Unmet Transportation Needs  . Lack of Transportation (Medical): Yes  . Lack of Transportation (Non-Medical): Yes  Physical Activity: Not on file  Stress: Not on file  Social Connections: Not on file  Intimate Partner Violence: Not on file    ROS Review of Systems  Constitutional: Positive for fatigue. Negative for chills, fever and unexpected weight change.  Respiratory: Negative for cough and shortness of breath.   Cardiovascular: Positive for palpitations and leg swelling. Negative for chest pain.  Gastrointestinal: Positive for abdominal distention. Negative for abdominal pain, nausea and vomiting.  Genitourinary: Positive for menstrual problem and vaginal bleeding.  Neurological: Negative for dizziness, syncope, light-headedness and headaches.  Psychiatric/Behavioral: Negative for hallucinations, self-injury, sleep disturbance and suicidal ideas. The patient is not nervous/anxious.     Objective:   Today's Vitals: BP (!) 187/93 (BP Location: Right Arm, Patient Position: Sitting, Cuff Size: Normal)   Pulse 100   Temp 99.2 F (37.3 C) (Oral)   Wt 152 lb 6.4 oz (69.1 kg)   LMP 02/03/2021   SpO2 100%   BMI 24.97 kg/m   Physical exam General: well appearing in NAD Cardiac: tachycardic rate, regular rhythm, systolic murmur present and best appreciated over the RUSB. +2 LE edema Pulm: breathing comfortably on room air.  Lungs clear bilaterally Skin: toenail thickening and discoloration appreciated bilaterally Psych: normal affect. Normal thought content. Denies suicidal thoughts  Assessment & Plan:   Problem List Items Addressed This Visit      Cardiovascular and Mediastinum   SVT (supraventricular tachycardia) ([place]) (Chronic)    Follows with heart failure. Planning on referral to EP after volume status is improved. -Amiodarone, metoprolol per heart failure.       CHF (congestive heart failure) (HCC) (Chronic)    She has been following at the heart failure clinic as well as having the paramedicine program see her at home.  She reports continued improvement in symptoms. Her weights are essentially unchanged since 3/1. She has +2 lower extremity edema which she feels is improved from a few days ago. She endorses medication adherence. Her blood pressure is severely elevated in the office today to 187/93 however this is actually better than it had been in the heart failure clinic with the systolic pressure being in the 210s-230.   Assessment:  I suspect that her elevated blood pressures are related to her hypervolemic state and that they will improve with ongoing diuresis.  Fortunately, it seems that SW from the heart failure clinic has gotten her tied in with resources and she is now being  able to afford her medications.   Plan -I have reached out to Dr. Haroldine Laws to discuss going up on her toresemide since her weight has not budged much and BP is up -BMP ordered at today's visit. Our clinic lab tech notes that sample did appear hemolyzed so K may not be accurate -she will continue to follow with the heart failure clinic and paramedicine. They are planning on starting an SGLT2 in the near future.  -continue GDMT with imdur, entresto, spironolactone, toresemide and metalozone  -amyloid eval per heart failure         Musculoskeletal and Integument   Onychomycosis    Nail thickening and discoloration  present bilaterally.  -referral placed to podiatry        Genitourinary   AKI (acute kidney injury) ([place])    Baseline creatinine is around 1. She developed an AKI during her recent admission in the setting of diuresis however it has improved and is back to near her baseline.  Will check a BMP today.        Other   Fibroid (Chronic)    Continues to heavy bleeding. Suspect she will need a hysterectomy. She has an initial visit with GYN on 3/17.      Psychosis ([place]) - Primary (Chronic)    She seems to be doing well on the abilify. Denies hallucinations or abnormal thoughts since hospitalization.  Assessment: I question if this may have been related to PRES given her severely elevated pressures this admission. Her MRI during that admission was not consistent with this but did show very severe small vessel disease given her age.  Plan -referral to psychiatry placed -continue abilify      Relevant Orders   Ambulatory referral to Psychiatry   Basic metabolic panel   IDA (iron deficiency anemia) (Chronic)    Iron panel during her recent hospitalization is consistent with IDA. Likely due to blood loss 2/2 to her fibroid and heavy menses. She was recently started on iron supplementation on 3/1.  Plan -continue iron repletion with oral supplementation -Will not plan on rechecking until underlying source is resolved. Hopeful this will be soon      Healthcare maintenance (Chronic)    Discussed preventative screening.  She is amenable to breast cancer screening. Discussed new guidelines for colon cancer screening. She would like to defer to a later date given all of her upcoming appointments which is understandable. She is due for cervical cancer screening with a pap smear however will defer this to GYN since she is going to see them on 3/17. She received the tetanus vaccine around 5 years ago so not due for another yet.  She had COVID in January and will be eligible for vaccination in  April. Will postpone until then. Plan -mammogram ordered this encounter       Other Visit Diagnoses    Ingrown toenail of both feet       Relevant Orders   Ambulatory referral to Podiatry   Encounter for screening mammogram for malignant neoplasm of breast       Relevant Orders   MM Digital Screening      Outpatient Encounter Medications as of 03/01/2021  Medication Sig  . ARIPiprazole (ABILIFY) 2 MG tablet Take 1 tablet (2 mg total) by mouth at bedtime.  Marland Kitchen amiodarone (PACERONE) 200 MG tablet Take 1 tablet (200 mg total) by mouth daily.  . ferrous sulfate 325 (65 FE) MG tablet Take 1 tablet (325 mg total) by mouth 2 (  two) times daily with a meal.  . hydrALAZINE (APRESOLINE) 50 MG tablet Take 100 mg by mouth 3 (three) times daily.  . isosorbide mononitrate (IMDUR) 60 MG 24 hr tablet Take 1 tablet (60 mg total) by mouth daily. (Patient not taking: Reported on 02/26/2021)  . metolazone (ZAROXOLYN) 2.5 MG tablet Take 1 tablet (2.5 mg total) by mouth 2 (two) times a week. Monday and Friday  . metoprolol (TOPROL-XL) 200 MG 24 hr tablet Take 1 tablet (200 mg total) by mouth daily. Take with or immediately following a meal.  . potassium chloride SA (KLOR-CON) 20 MEQ tablet Take 1 tablet (20 mEq total) by mouth 2 (two) times daily. With additional 40 meq with Metolazone (Mon/Fr)  . sacubitril-valsartan (ENTRESTO) 97-103 MG Take 1 tablet by mouth 2 (two) times daily.  Marland Kitchen spironolactone (ALDACTONE) 25 MG tablet Take 0.5 tablets (12.5 mg total) by mouth daily.  Marland Kitchen torsemide (DEMADEX) 20 MG tablet Take 2 tablets (40 mg total) by mouth 2 (two) times daily.   No facility-administered encounter medications on file as of 03/01/2021.    Follow-up: Return in about 4 weeks (around 03/29/2021).   Pt discussed with Dr. Dareen Piano.  Mitzi Hansen, MD Internal Medicine Resident PGY-2 [facility] Pager: [phone] 03/01/2021 7:01 PM

## 2021-03-01 NOTE — Assessment & Plan Note (Addendum)
She has been following at the heart failure clinic as well as having the paramedicine program see her at home.  She reports continued improvement in symptoms. Her weights are essentially unchanged since 3/1. She has +2 lower extremity edema which she feels is improved from a few days ago. She endorses medication adherence. Her blood pressure is severely elevated in the office today to 187/93 however this is actually better than it had been in the heart failure clinic with the systolic pressure being in the 210s-230.   Assessment:  I suspect that her elevated blood pressures are related to her hypervolemic state and that they will improve with ongoing diuresis.  Fortunately, it seems that SW from the heart failure clinic has gotten her tied in with resources and she is now being able to afford her medications.   Plan -I have reached out to Dr. Haroldine Laws to discuss going up on her toresemide since her weight has not budged much and BP is up -BMP ordered at today's visit. Our clinic lab tech notes that sample did appear hemolyzed so K may not be accurate -she will continue to follow with the heart failure clinic and paramedicine. They are planning on starting an SGLT2 in the near future.  -continue GDMT with imdur, entresto, spironolactone, toresemide and metalozone  -amyloid eval per heart failure

## 2021-03-02 ENCOUNTER — Other Ambulatory Visit (HOSPITAL_COMMUNITY): Payer: Self-pay | Admitting: Internal Medicine

## 2021-03-02 ENCOUNTER — Other Ambulatory Visit (HOSPITAL_COMMUNITY): Payer: Self-pay | Admitting: Cardiology

## 2021-03-02 ENCOUNTER — Telehealth (HOSPITAL_COMMUNITY): Payer: Self-pay | Admitting: Licensed Clinical Social Worker

## 2021-03-02 LAB — BASIC METABOLIC PANEL
BUN/Creatinine Ratio: 20 (ref 9–23)
BUN: 26 mg/dL — ABNORMAL HIGH (ref 6–24)
CO2: 18 mmol/L — ABNORMAL LOW (ref 20–29)
Calcium: 9.2 mg/dL (ref 8.7–10.2)
Chloride: 105 mmol/L (ref 96–106)
Creatinine, Ser: 1.28 mg/dL — ABNORMAL HIGH (ref 0.57–1.00)
Glucose: 130 mg/dL — ABNORMAL HIGH (ref 65–99)
Potassium: 4.9 mmol/L (ref 3.5–5.2)
Sodium: 138 mmol/L (ref 134–144)
eGFR: 52 mL/min/{1.73_m2} — ABNORMAL LOW (ref >59)

## 2021-03-02 MED ORDER — TORSEMIDE 20 MG PO TABS: 40.0000 mg | ORAL_TABLET | Freq: Two times a day (BID) | ORAL | 2 refills | Status: AC

## 2021-03-02 MED ORDER — AMIODARONE HCL 200 MG PO TABS: 200.0000 mg | ORAL_TABLET | Freq: Every day | ORAL | 2 refills | Status: DC

## 2021-03-02 MED ORDER — SPIRONOLACTONE 25 MG PO TABS: 12.5000 mg | ORAL_TABLET | Freq: Every day | ORAL | 3 refills | Status: DC

## 2021-03-02 MED ORDER — HYDRALAZINE HCL 100 MG PO TABS: 100.0000 mg | ORAL_TABLET | Freq: Three times a day (TID) | ORAL | 3 refills | Status: AC

## 2021-03-02 MED ORDER — METOLAZONE 2.5 MG PO TABS: 2.5000 mg | ORAL_TABLET | ORAL | 3 refills | Status: DC

## 2021-03-02 MED ORDER — METOPROLOL SUCCINATE ER 200 MG PO TB24: 200.0000 mg | ORAL_TABLET | Freq: Every day | ORAL | 2 refills | Status: AC

## 2021-03-02 MED ORDER — ISOSORBIDE MONONITRATE ER 60 MG PO TB24: 60.0000 mg | ORAL_TABLET | Freq: Every day | ORAL | 2 refills | Status: DC

## 2021-03-02 MED ORDER — POTASSIUM CHLORIDE CRYS ER 20 MEQ PO TBCR: 20.0000 meq | EXTENDED_RELEASE_TABLET | Freq: Two times a day (BID) | ORAL | 3 refills | Status: DC

## 2021-03-02 MED FILL — hydrALAZINE HCL 100 MG TABS: 100 | 30 days supply | Qty: 90 | Fill #0

## 2021-03-02 MED FILL — METOPROLOL SUCCINATE ER 200: 200 | 30 days supply | Qty: 30 | Fill #0

## 2021-03-02 MED FILL — metOLazone 2.5 MG TABS: 2.5 | 28 days supply | Qty: 8 | Fill #0

## 2021-03-02 MED FILL — ISOSORBIDE MONONITRATE ER 6: 60 | 30 days supply | Qty: 30 | Fill #0

## 2021-03-02 MED FILL — POTASSIUM CHLORIDE CRYS ER: 20 | 30 days supply | Qty: 75 | Fill #0

## 2021-03-02 MED FILL — AMIODARONE HCL 200 MG TAB: 200 | 30 days supply | Qty: 30 | Fill #0

## 2021-03-02 MED FILL — TORSEMIDE 20 MG TABLET: 20 | 30 days supply | Qty: 120 | Fill #0

## 2021-03-02 NOTE — Telephone Encounter (Signed)
MEDICATIONS SENT TO [facility] WITH HF FUND

## 2021-03-02 NOTE — Telephone Encounter (Signed)
CSW called pt again today to check if she was able to speak to insurance- pt confirms she does not have active benefits because she has been unable to pay.  CSW had clinic staff to [facility] through the HF fund and called them to request it get filled today.  Pt planning to pick up medications this afternoon.  Will continue to follow and assist as needed.  Jorge Ny, LCSW Clinical Social Worker [facility] Desk#: [phone] Cell#: [phone]

## 2021-03-05 ENCOUNTER — Other Ambulatory Visit (HOSPITAL_COMMUNITY): Payer: Self-pay

## 2021-03-05 NOTE — Progress Notes (Signed)
Came out today for med rec, picked up her meds on way here as well, she has been without her torsemide for a few days, metoprolol, isosorbide for numerous days along with the amio.  Picked those meds up from HF fund right now.  She is still out of the abilify. She said she would call pharmacy to see about that.    Pt denies c/p, sob same, still having vaginal bleeding but she goes to obgyn this week.  meds verified and pill box refilled.  She reported she already took the noon dose with her AM dose, I advised her to be sure to time it out 8 hrs apart as directed. She said she would.  Since she has not had the metoprolol in several days I did get her to take one now.   [id number] B/p-185/107 p-116  [name], EMT-Paramedic  03/05/21

## 2021-03-05 NOTE — Progress Notes (Signed)
Internal Medicine Clinic Attending  Case discussed with Dr. Christian  At the time of the visit.  We reviewed the resident's history and exam and pertinent patient test results.  I agree with the assessment, diagnosis, and plan of care documented in the resident's note.  

## 2021-03-06 ENCOUNTER — Other Ambulatory Visit: Payer: Self-pay | Admitting: Internal Medicine

## 2021-03-06 ENCOUNTER — Telehealth: Payer: Self-pay | Admitting: Behavioral Health

## 2021-03-06 NOTE — Progress Notes (Signed)
Thank you for reaching out to her! I will remove the abilify from her med list.

## 2021-03-06 NOTE — Telephone Encounter (Signed)
Contacted Pt today & spoke briefly. Pt is living w/her [name] & sts she has plenty of family support. Pt reports she has not taken her Abilify prescription in over 2 wks. She reports doing fine. Pt is currently under financial constraints. Pt denies need for Psychiatrist or Psychologist. Pt agreed she knows how to access resources if she needs Psychiatric care.

## 2021-03-07 NOTE — Progress Notes (Signed)
TY

## 2021-03-08 ENCOUNTER — Telehealth (HOSPITAL_COMMUNITY): Payer: Self-pay | Admitting: Licensed Clinical Social Worker

## 2021-03-08 ENCOUNTER — Other Ambulatory Visit: Payer: Self-pay

## 2021-03-08 ENCOUNTER — Encounter (HOSPITAL_COMMUNITY): Payer: BLUE CROSS/BLUE SHIELD

## 2021-03-08 ENCOUNTER — Encounter (HOSPITAL_COMMUNITY): Payer: Self-pay

## 2021-03-08 ENCOUNTER — Emergency Department (HOSPITAL_COMMUNITY)
Admission: EM | Admit: 2021-03-08 | Discharge: 2021-03-08 | Disposition: A | Discharge status: Home / Self Care | Payer: Medicaid Other | Attending: Emergency Medicine | Admitting: Emergency Medicine

## 2021-03-08 ENCOUNTER — Ambulatory Visit (INDEPENDENT_AMBULATORY_CARE_PROVIDER_SITE_OTHER): Payer: Medicaid - Out of State | Admitting: Obstetrics and Gynecology

## 2021-03-08 VITALS — BP 218/88 | HR 77 | Ht 66.0 in | Wt 156.4 lb

## 2021-03-08 DIAGNOSIS — Z5321 Procedure and treatment not carried out due to patient leaving prior to being seen by health care provider: Secondary | ICD-10-CM | POA: Diagnosis not present

## 2021-03-08 DIAGNOSIS — R030 Elevated blood-pressure reading, without diagnosis of hypertension: Secondary | ICD-10-CM | POA: Diagnosis present

## 2021-03-08 DIAGNOSIS — N921 Excessive and frequent menstruation with irregular cycle: Secondary | ICD-10-CM | POA: Diagnosis not present

## 2021-03-08 DIAGNOSIS — D219 Benign neoplasm of connective and other soft tissue, unspecified: Secondary | ICD-10-CM | POA: Diagnosis not present

## 2021-03-08 DIAGNOSIS — R519 Headache, unspecified: Secondary | ICD-10-CM | POA: Diagnosis not present

## 2021-03-08 DIAGNOSIS — I10 Essential (primary) hypertension: Secondary | ICD-10-CM | POA: Diagnosis not present

## 2021-03-08 LAB — CBC WITH DIFFERENTIAL/PLATELET
Abs Immature Granulocytes: 0 10*3/uL (ref 0.00–0.07)
Basophils Absolute: 0.1 10*3/uL (ref 0.0–0.1)
Basophils Relative: 1 %
Eosinophils Absolute: 0.1 10*3/uL (ref 0.0–0.5)
Eosinophils Relative: 2 %
HCT: 27.9 % — ABNORMAL LOW (ref 36.0–46.0)
Hemoglobin: 8.2 g/dL — ABNORMAL LOW (ref 12.0–15.0)
Lymphocytes Relative: 18 %
Lymphs Abs: 1.2 10*3/uL (ref 0.7–4.0)
MCH: 23.7 pg — ABNORMAL LOW (ref 26.0–34.0)
MCHC: 29.4 g/dL — ABNORMAL LOW (ref 30.0–36.0)
MCV: 80.6 fL (ref 80.0–100.0)
Monocytes Absolute: 0.3 10*3/uL (ref 0.1–1.0)
Monocytes Relative: 4 %
Neutro Abs: 5.2 10*3/uL (ref 1.7–7.7)
Neutrophils Relative %: 75 %
Platelets: 306 10*3/uL (ref 150–400)
RBC: 3.46 MIL/uL — ABNORMAL LOW (ref 3.87–5.11)
WBC: 6.9 10*3/uL (ref 4.0–10.5)
nRBC: 0 % (ref 0.0–0.2)
nRBC: 0 /100 WBC

## 2021-03-08 LAB — BASIC METABOLIC PANEL
Anion gap: 7 (ref 5–15)
BUN: 19 mg/dL (ref 6–20)
CO2: 20 mmol/L — ABNORMAL LOW (ref 22–32)
Calcium: 9.9 mg/dL (ref 8.9–10.3)
Chloride: 111 mmol/L (ref 98–111)
Creatinine, Ser: 1.03 mg/dL — ABNORMAL HIGH (ref 0.44–1.00)
GFR, Estimated: >60 mL/min (ref >60)
Glucose, Bld: 101 mg/dL — ABNORMAL HIGH (ref 70–99)
Potassium: 3.4 mmol/L — ABNORMAL LOW (ref 3.5–5.1)
Sodium: 138 mmol/L (ref 135–145)

## 2021-03-08 LAB — I-STAT BETA HCG BLOOD, ED (MC, WL, AP ONLY): I-stat hCG, quantitative: <5 m[IU]/mL (ref <5)

## 2021-03-08 MED ORDER — MEGESTROL ACETATE 40 MG PO TABS: 40.0000 mg | ORAL_TABLET | Freq: Two times a day (BID) | ORAL | 5 refills | Status: DC

## 2021-03-08 NOTE — ED Notes (Signed)
LWBS 

## 2021-03-08 NOTE — Progress Notes (Signed)
Subjective:    Patient ID: Holly Bradley, female    DOB: 25-Feb-1976, 45 y.o.   MRN: [id number] CC: heavy menstrual bleeding, fibroid HPI 45 yo AA female seen at [facility] for 2 months of increased vaginal bleeding with clots.  The pt has SVT, congestive heart failure and uncontrolled severe hypertension.  She has an extremely large fibroid uterus of which she has known about for 2-3 years.  Pt's primary concern today was to decrease her vaginal bleeding through any means.  She declined physical exam today due to bleeding.  Pt had blood pressures of 207/93, 204/89 and 218/88.  The last value was a manual blood pressure.  The patient believes this is from stress.  The patient was immediately advised she needed care for her blood pressure primarily with emergent care.  Pt refused 911 assistance and emergency medical intervention.  Pt advised to go straight to the ER, but she again declined and said she would go home and recheck her blood pressure even though she had three verified severe range pressure.  Pt advised she was at risk for stroke or other damage to organ symptoms.  There was no against medical advice form in this clinic; however, pt was advised if she left on her own it is definitely against medical advice and any complications are her responsibility.  Again pt has large fibroid uterus from CT and likely needs hysterectomy.  However, she is a poor surgical candidate  With the congestive heart failure and uncontrolled hypertension.  She would need better blood pressure control along with medical and anesthesia clearance before considering any surgery.   Review of Systems     Objective:   Physical Exam Vitals:   03/08/21 1556 03/08/21 1619  BP: (!) 204/89 (!) 218/88  Pulse: 77    CLINICAL DATA:  Abdominal distension.  COVID positive.   EXAM: CT ABDOMEN AND PELVIS WITHOUT CONTRAST   TECHNIQUE: Multidetector CT imaging of the abdomen and pelvis was performed following the  standard protocol without IV contrast.   COMPARISON:  None.   FINDINGS: Lower chest: There is consolidation versus atelectasis at the left lung base.There is massive cardiomegaly. There is a small to moderate-sized pericardial effusion. The intracardiac blood pool is hypodense relative to the adjacent myocardium consistent with anemia.   Hepatobiliary: The liver appears enlarged but is not well evaluated secondary to the lack of IV contrast and significant respiratory motion artifact. The gallbladder was not well evaluated.The biliary tree is not well evaluated.   Pancreas: The pancreas is not well evaluated.   Spleen: Unremarkable.   Adrenals/Urinary Tract:   --Adrenal glands: The adrenal glands are not well evaluated.   --Right kidney/ureter: There appears to be at least mild right-sided hydronephrosis.   --Left kidney/ureter: There appears to be mild left-sided hydronephrosis. The left kidney appears atrophic.   --Urinary bladder: The urinary bladder is displaced to the right.   Stomach/Bowel:   --Stomach/Duodenum: No hiatal hernia or other gastric abnormality. Normal duodenal course and caliber.   --Small bowel: Unremarkable.   --Colon: The colon is suboptimally evaluated. There does not appear to be colonic distension.   --Appendix: The appendix cannot be reliably identified.   Vascular/Lymphatic: Atherosclerotic calcification is present within the non-aneurysmal abdominal aorta, without hemodynamically significant stenosis.   --evaluation for adenopathy is limited.   Reproductive: There is a very large pelvic mass measuring approximately 23 x 19 x 12 cm. Is difficult to determine the exact origin of this mass, however  it is favored to be uterine in origin. Neither ovary is well identified on this study.   Other: There is a small volume of free fluid the patient's pelvis and abdomen. The abdominal wall is normal.   Musculoskeletal. No acute displaced  fractures.   IMPRESSION: 1. Exam is severely limited by extensive motion artifact and lack of IV contrast. 2. There is a very large 23 cm mass arising from the patient's pelvis. This is favored to be uterine in origin, however evaluation is limited by lack of IV contrast. This may represent a large fibroid uterus. Neither ovary is well identified. Gynecologic consultation is recommended. 3. Small volume free fluid in the abdomen and pelvis. 4. Significant cardiomegaly with a small to moderate-sized pericardial effusion. 5. Anemia. 6.  Aortic Atherosclerosis (ICD10-I70.0).       Assessment & Plan:   1. Menorrhagia with irregular cycle Please see above note.  Pt poor surgical candidate .  Megace prescribed after side effect review to temporize heavy bleeding.  Pt needs endometrial biopsy prior to any surgical procedure. - megestrol (MEGACE) 40 MG tablet; Take 1 tablet (40 mg total) by mouth 2 (two) times daily. Can increase to two tablets twice a day in the event of heavy bleeding  Dispense: 60 tablet; Refill: 5  2. Fibroid May consider pelvic/abdominal ultrasound for further evaluation.  F/u in 1 week if possible.  Pt advised to follow up at her cardiac clinic for better blood pressure control  I spent 15 minutes dedicated to the care of this patient including previsit review of records, face to face time with the patient discussing limitations of care, safety and documentation of her being against medical advice. and post visit testing.    Griffin Basil, MD Faculty Attending, [facility]

## 2021-03-08 NOTE — ED Triage Notes (Signed)
Brought in by [facility] - c/o elevated blood pressure - 200/90 and 188/94. Hx of hypertension and compliant with bp med. Also reports headache.

## 2021-03-08 NOTE — Patient Instructions (Signed)
Endometrial Biopsy  An endometrial biopsy is a procedure to remove tissue samples from the endometrium, which is the lining of the uterus. The tissue that is removed can then be checked under a microscope for disease. This procedure is used to diagnose conditions such as endometrial cancer, endometrial tuberculosis, polyps, or other inflammatory conditions. This procedure may also be used to investigate uterine bleeding to determine where you are in your menstrual cycle or how your hormone levels are affecting the lining of the uterus. Tell a health care provider about:  Any allergies you have.  All medicines you are taking, including vitamins, herbs, eye drops, creams, and over-the-counter medicines.  Any problems you or family members have had with anesthetic medicines.  Any blood disorders you have.  Any surgeries you have had.  Any medical conditions you have.  Whether you are pregnant or may be pregnant. What are the risks? Generally, this is a safe procedure. However, problems may occur, including:  Bleeding.  Pelvic infection.  Puncture of the wall of the uterus with the biopsy device (rare).  Allergic reactions to medicines. What happens before the procedure?  Keep a record of your menstrual cycles as told by your health care provider. You may need to schedule your procedure for a specific time in your cycle.  You may want to bring a sanitary pad to wear after the procedure.  Plan to have someone take you home from the hospital or clinic.  Ask your health care provider about: ? Changing or stopping your regular medicines. This is especially important if you are taking diabetes medicines, arthritis medicines, or blood thinners. ? Taking medicines such as aspirin and ibuprofen. These medicines can thin your blood. Do not take these medicines unless your health care provider tells you to take them. ? Taking over-the-counter medicines, vitamins, herbs, and  supplements. What happens during the procedure?  You will lie on an exam table with your feet and legs supported as in a pelvic exam.  Your health care provider will insert an instrument (speculum) into your vagina to see your cervix.  Your cervix will be cleansed with an antiseptic solution.  A medicine (local anesthetic) will be used to numb the cervix.  A forceps instrument (tenaculum) will be used to hold your cervix steady for the biopsy.  A thin, rod-like instrument (uterine sound) will be inserted through your cervix to determine the length of your uterus and the location where the biopsy sample will be removed.  A thin, flexible tube (catheter) will be inserted through your cervix and into the uterus. The catheter will be used to collect the biopsy sample from your endometrial tissue.  The catheter and speculum will then be removed, and the tissue sample will be sent to a lab for examination. The procedure may vary among health care providers and hospitals. What can I expect after procedure?  You will rest in a recovery area until you are ready to go home.  You may have mild cramping and a small amount of vaginal bleeding. This is normal.  You may have a small amount of vaginal bleeding for a few days. This is normal.  It is up to you to get the results of your procedure. Ask your health care provider, or the department that is doing the procedure, when your results will be ready. Follow these instructions at home:  Take over-the-counter and prescription medicines only as told by your health care provider.  Do not douche, use tampons, or have   sexual intercourse until your health care provider approves.  Return to your normal activities as told by your health care provider. Ask your health care provider what activities are safe for you.  Follow instructions from your health care provider about any activity restrictions, such as restrictions on strenuous exercise or heavy  lifting.  Keep all follow-up visits. This is important. Contact a health care provider:  You have heavy bleeding, or bleed for longer than 2 days after the procedure.  You have bad smelling discharge from your vagina.  You have a fever or chills.  You have a burning sensation when urinating or you have difficulty urinating.  You have severe pain in your lower abdomen. Get help right away if you:  You have severe cramps in your stomach or back.  You pass large blood clots.  Your bleeding increases.  You become weak or light-headed, or you faint or lose consciousness. Summary  An endometrial biopsy is a procedure to remove tissue samples is taken from the endometrium, which is the lining of the uterus.  The tissue sample that is removed will be checked under a microscope for disease.  This procedure is used to diagnose conditions such as endometrial cancer, endometrial tuberculosis, polyps, or other inflammatory conditions.  After the procedure, it is common to have mild cramping and a small amount of vaginal bleeding for a few days.  Do not douche, use tampons, or have sexual intercourse until your health care provider approves. Ask your health care provider which activities are safe for you. This information is not intended to replace advice given to you by your health care provider. Make sure you discuss any questions you have with your health care provider. Document Revised: 07/03/2020 Document Reviewed: 07/03/2020 Elsevier Patient Education  2021 Elsevier Inc.  

## 2021-03-08 NOTE — Progress Notes (Signed)
Here today for bleeding and fibroids. Bleeding everyday heavy to medium for 2 months . Bp very elevated at 207/93. Rechecked at 204/89. Reported to Dr. Elgie Congo.Manually rechecked at 218/88. Patient refuses for office to call 911 for transport to ED.  Explained she is at high risk for stroke, heart attack. Refuses 911. Advised to go straight to ED .  Ladarrious Kirksey,RN

## 2021-03-08 NOTE — Telephone Encounter (Signed)
Pt now without active insurance as she has been unable to work and pay for her insurance benefit.  [employer] application for [employer] assistance submitted for review- fax confirmation received- awaiting determination  Will continue to follow and assist as needed  Jorge Ny, [facility] Desk#: [phone] Cell#: [phone]

## 2021-03-09 ENCOUNTER — Encounter: Payer: Self-pay | Admitting: Cardiology

## 2021-03-09 ENCOUNTER — Ambulatory Visit: Payer: Self-pay | Admitting: Cardiology

## 2021-03-09 ENCOUNTER — Telehealth (HOSPITAL_COMMUNITY): Payer: Self-pay | Admitting: Licensed Clinical Social Worker

## 2021-03-09 VITALS — BP 197/80 | HR 88 | Temp 101.5°F | Resp 16 | Ht 66.0 in | Wt 153.6 lb

## 2021-03-09 DIAGNOSIS — D500 Iron deficiency anemia secondary to blood loss (chronic): Secondary | ICD-10-CM

## 2021-03-09 DIAGNOSIS — F17210 Nicotine dependence, cigarettes, uncomplicated: Secondary | ICD-10-CM

## 2021-03-09 DIAGNOSIS — I5042 Chronic combined systolic (congestive) and diastolic (congestive) heart failure: Secondary | ICD-10-CM

## 2021-03-09 DIAGNOSIS — I420 Dilated cardiomyopathy: Secondary | ICD-10-CM

## 2021-03-09 DIAGNOSIS — I10 Essential (primary) hypertension: Secondary | ICD-10-CM

## 2021-03-09 DIAGNOSIS — I5082 Biventricular heart failure: Secondary | ICD-10-CM

## 2021-03-09 MED ORDER — ISOSORBIDE DINITRATE 30 MG PO TABS: 60.0000 mg | ORAL_TABLET | Freq: Three times a day (TID) | ORAL | 3 refills | Status: DC

## 2021-03-09 NOTE — Telephone Encounter (Signed)
Pt wishes to pursue applying for disability since she has been unable to work since January.  Referral to [facility] completed and sent in for review.  Will continue to follow and assist as needed  Jorge Ny, [facility] Desk#: [phone] Cell#: [phone]

## 2021-03-09 NOTE — Progress Notes (Signed)
Primary Physician/Referring:  Mitzi Hansen, MD  Patient ID: Holly Bradley, female    DOB: [birth year], 45 y.o.   MRN: [id number]  Chief Complaint  Patient presents with  . Congestive Heart Failure  . New Patient (Initial Visit)  . Medication Reaction   HPI:    Holly Bradley  is a 45 y.o. 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  She also has history of large uterine fibroids and iron deficiency anemia secondary to menorrhagia as well as history of psychosis.    Patient was admitted in May 2021 with SVT, severe anemia with hemoglobin of 4.4, and marked hypertension.  Patient was converted to normal sinus rhythm with adenosine and started on beta-blocker therapy and her antihypertensive medications for titrated.  She was then admitted again in January 2022 with SVT, marked hypertension, and AKI.  At this time patient was started on amiodarone by cardiac electrophysiology for SVT.  Patient most recently hospitalized 02/01/2021 with another episode of SVT and hypertension.  She has since been followed closely by advanced heart failure team.  Patient is presently on guideline directed medical therapy noting metoprolol, Entresto, spironolactone, hydralazine, isosorbide dinitrate.  She also continues to take amiodarone 200 mg daily.  Presently taking torsemide 40 mg twice daily and Zaroxolyn 2.5 mg twice per week.  She notably has had periods of medication noncompliance in the past, she also notably struggled with financial constraints and affording her medications.  She now presents to establish care with a primary cardiologist in our office.  Blood pressure remains markedly elevated in the office today, however patient does admit she has not had her afternoon medications including callousing, metoprolol, and Entresto.  Patient reports bilateral lower leg edema, orthopnea, dyspnea  on exertion.  Patient also admits to dietary noncompliance, although she has significantly reduced her sodium intake as of late.  She lives with her brother who assists her in her care.  She also follows closely with OB/GYN, who have not recommended surgical intervention for large uterine fibroid in view of current uncontrolled hypertension and heart failure.  Past Medical History:  Diagnosis Date  . CHF (congestive heart failure) ([place])   . Chronic blood loss anemia    Related to uterine fibroids  . Essential hypertension    Poorly controlled, repeat by report only on nicardipine 90 mg  . Fibroid    Likely complicated by by menometrorrhagia and chronic anemia  . Paroxysmal SVT (supraventricular tachycardia) (HCC)    Frequent episodes, can last anywhere from 20 minutes to 12-15 hours.  Not on beta-blocker or calcium channel blocker  . Uterine fibroid    Past Surgical History:  Procedure Laterality Date  . Unknown     Family History  Problem Relation Age of Onset  . Dementia Mother     Social History   Tobacco Use  . Smoking status: Current Some Day Smoker    Packs/day: 0.25    Types: Cigarettes  . Smokeless tobacco: Never Used  Substance Use Topics  . Alcohol use: Not Currently    Comment: Occasionally   Marital Status: Single   ROS  Review of Systems  Constitutional: Negative for malaise/fatigue and weight gain.  Cardiovascular: Positive for dyspnea on exertion, leg swelling and orthopnea. Negative for chest pain, claudication, near-syncope, palpitations, paroxysmal nocturnal dyspnea and syncope.  Respiratory: Positive for shortness of breath.   Hematologic/Lymphatic: Does not bruise/bleed easily.  Gastrointestinal: Negative for melena.  Genitourinary: Positive for menorrhagia and non-menstrual bleeding.  Neurological: Positive for headaches. Negative for dizziness and weakness.    Objective  Blood pressure (!) 197/80, pulse 88, temperature (!) 101.5 F (38.6 C),  temperature source Temporal, resp. rate 16, height 5\' 6"  (1.676 m), weight 153 lb 9.6 oz (69.7 kg), SpO2 98 %.  Vitals with BMI 03/09/2021 03/08/2021 03/08/2021  Height 5\' 6"  5\' 6"  -  Weight 153 lbs 10 oz 156 lbs 5 oz -  BMI 02.7 74.12 -  Systolic 878 - 676  Diastolic 80 - 89  Pulse 88 - 73     Physical Exam Vitals reviewed.  Constitutional:      General: She is not in acute distress. HENT:     Head: Normocephalic and atraumatic.  Neck:     Vascular: Carotid bruit (bilaterally) and JVD present.  Cardiovascular:     Rate and Rhythm: Normal rate and regular rhythm.     Pulses: Intact distal pulses.          Carotid pulses are 2+ on the right side and 2+ on the left side.      Radial pulses are 2+ on the right side and 2+ on the left side.       Femoral pulses are 2+ on the right side and 2+ on the left side.      Popliteal pulses are 1+ on the right side and 1+ on the left side.       Dorsalis pedis pulses are 0 on the right side and 0 on the left side.       Posterior tibial pulses are 0 on the right side and 0 on the left side.     Heart sounds: S1 normal and S2 normal. Murmur heard.  High-pitched blowing holosystolic murmur is present at the apex.   Pulmonary:     Effort: Pulmonary effort is normal. No respiratory distress.     Breath sounds: No wheezing, rhonchi or rales.  Abdominal:     General: There is no distension.     Palpations: There is mass (bilateral lower quadrants and LUQ).  Musculoskeletal:     Right lower leg: Edema (1+pitting) present.     Left lower leg: Edema (1+ pitting) present.  Skin:    Findings: No bruising or lesion.  Neurological:     General: No focal deficit present.     Mental Status: She is alert.     Laboratory examination:   Recent Labs    05/01/20 1143 05/02/20 0422 05/03/20 0350 01/10/21 1054 02/14/21 0904 02/20/21 1254 03/01/21 1417 03/08/21 2035  NA 139 138 140   < > 140 141 138 138  K 3.5 3.5 3.9   < > 3.5 3.4* 4.9 3.4*   CL 107 107 111   < > 106 104 105 111  CO2 19* 20* 21*   < > 23 26 18* 20*  GLUCOSE 99 107* 101*   < > 119* 87 130* 101*  BUN 13 15 12    < > 25* 31* 26* 19  CREATININE 0.86 0.91 0.90   < > 1.22* 1.25* 1.28* 1.03*  CALCIUM 9.4 8.6* 8.4*   < > 9.4 9.5 9.2 9.9  GFRNONAA >60 >60 >60   < > 55* 54*  --  >60  GFRAA >60 >60 >60  --   --   --   --   --    < > = values in this interval not displayed.   estimated creatinine  clearance is 63.9 mL/min (A) (by C-G formula based on SCr of 1.03 mg/dL (H)).  CMP Latest Ref Rng & Units 03/08/2021 03/01/2021 02/20/2021  Glucose 70 - 99 mg/dL 101(H) 130(H) 87  BUN 6 - 20 mg/dL 19 26(H) 31(H)  Creatinine 0.44 - 1.00 mg/dL 1.03(H) 1.28(H) 1.25(H)  Sodium 135 - 145 mmol/L 138 138 141  Potassium 3.5 - 5.1 mmol/L 3.4(L) 4.9 3.4(L)  Chloride 98 - 111 mmol/L 111 105 104  CO2 22 - 32 mmol/L 20(L) 18(L) 26  Calcium 8.9 - 10.3 mg/dL 9.9 9.2 9.5  Total Protein 6.5 - 8.1 g/dL - - -  Total Bilirubin 0.3 - 1.2 mg/dL - - -  Alkaline Phos 38 - 126 U/L - - -  AST 15 - 41 U/L - - -  ALT 0 - 44 U/L - - -   CBC Latest Ref Rng & Units 03/08/2021 02/20/2021 02/12/2021  WBC 4.0 - 10.5 K/uL 6.9 6.3 8.4  Hemoglobin 12.0 - 15.0 g/dL 8.2(L) 8.8(L) 8.6(L)  Hematocrit 36.0 - 46.0 % 27.9(L) 29.9(L) 29.6(L)  Platelets 150 - 400 K/uL 306 380 328    Lipid Panel No results for input(s): CHOL, TRIG, LDLCALC, VLDL, HDL, CHOLHDL, LDLDIRECT in the last 8760 hours.  HEMOGLOBIN A1C Lab Results  Component Value Date   HGBA1C 5.6 01/10/2021   MPG 114.02 01/10/2021   TSH Recent Labs    05/02/20 0422 01/10/21 1202 01/23/21 1022  TSH 3.073 5.282* 1.953    External labs:  None   Medications and allergies  No Known Allergies   Outpatient Medications Prior to Visit  Medication Sig Dispense Refill  . amiodarone (PACERONE) 200 MG tablet Take 1 tablet (200 mg total) by mouth daily. 30 tablet 2  . ARIPiprazole (ABILIFY) 2 MG tablet Take 2 mg by mouth daily.    . ferrous sulfate 325  (65 FE) MG tablet Take 1 tablet (325 mg total) by mouth 2 (two) times daily with a meal. 60 tablet 3  . hydrALAZINE (APRESOLINE) 100 MG tablet Take 1 tablet (100 mg total) by mouth 3 (three) times daily. 90 tablet 3  . metolazone (ZAROXOLYN) 2.5 MG tablet Take 1 tablet (2.5 mg total) by mouth 2 (two) times a week. Monday and Friday 30 tablet 3  . metoprolol (TOPROL-XL) 200 MG 24 hr tablet Take 1 tablet (200 mg total) by mouth daily. Take with or immediately following a meal. 30 tablet 2  . potassium chloride SA (KLOR-CON) 20 MEQ tablet Take 1 tablet (20 mEq total) by mouth 2 (two) times daily. With additional 40 meq with Metolazone (Mon/Fr) 75 tablet 3  . sacubitril-valsartan (ENTRESTO) 97-103 MG Take 1 tablet by mouth 2 (two) times daily. 60 tablet 3  . spironolactone (ALDACTONE) 25 MG tablet Take 0.5 tablets (12.5 mg total) by mouth daily. 15 tablet 3  . torsemide (DEMADEX) 20 MG tablet Take 2 tablets (40 mg total) by mouth 2 (two) times daily. 120 tablet 2  . isosorbide mononitrate (IMDUR) 60 MG 24 hr tablet Take 1 tablet (60 mg total) by mouth daily. 30 tablet 2  . megestrol (MEGACE) 40 MG tablet Take 1 tablet (40 mg total) by mouth 2 (two) times daily. Can increase to two tablets twice a day in the event of heavy bleeding 60 tablet 5   No facility-administered medications prior to visit.     Radiology:   No results found.  Chest X-ray 01/22/2021:  Right upper extremity midline catheter has been placed with its tip overlying  the expected brachial axillary junction. The left hemithorax is partially excluded. Mild cardiomegaly is again noted. Right lung is clear. No pneumothorax. No pleural effusion on the right. Pulmonary vascularity is normal. IMPRESSION: Right upper extremity midline catheter tip at the brachial axillary junction  CT of the abdomen and pelvis without contrast 01/11/2021: 1. Exam is severely limited by extensive motion artifact and lack of IV contrast. 2. There is a  very large 23 cm mass arising from the patient's pelvis. This is favored to be uterine in origin, however evaluation is limited by lack of IV contrast. This may represent a large fibroid uterus. Neither ovary is well identified. Gynecologic consultation is recommended. 3. Small volume free fluid in the abdomen and pelvis. 4. There is massive cardiomegaly. There is a small to moderate-sized pericardial effusion. The intracardiac blood pool is hypodense relative to the adjacent myocardium consistent with anemia. 5. Anemia. 6. Aortic Atherosclerosis (ICD10-I70.0).  Cardiac Studies:   Echocardiogram 01/11/2021:  1. Left ventricular ejection fraction, by estimation, is 20 to 25%. The left ventricle has severely decreased function. The left ventricle demonstrates global hypokinesis. The left ventricular internal cavity size was mildly to moderately dilated. There is moderate concentric left ventricular hypertrophy. Left ventricular diastolic parameters are consistent with Grade II diastolic dysfunction (pseudonormalization). Elevated left ventricular end-diastolic pressure. The average left ventricular global longitudinal strain is -5.8 %. The global longitudinal strain is abnormal.  2. Right ventricular systolic function is moderately reduced. The right ventricular size is normal. There is moderately elevated pulmonary artery systolic pressure.  3. Left atrial size was mildly dilated.  4. Right atrial size was mildly dilated.  5. Moderate pericardial effusion. The pericardial effusion is circumferential. There is no evidence of cardiac tamponade.  6. The mitral valve is normal in structure. Mild mitral valve regurgitation. No evidence of mitral stenosis.  7. The aortic valve is tricuspid. Aortic valve regurgitation is not visualized. No aortic stenosis is present.  8. The inferior vena cava is dilated in size with <50% respiratory variability, suggesting right atrial pressure of 15 mmHg.  EKG:    EKG 03/09/2021: Sinus rhythm at a rate of 75 bpm.  Normal axis. Biatrial enlargement.  Left ventricular hypertrophy with secondary ST-T changes.   ECG 02/14/21 (external): SR, LVH 80 bpm, no ectopy   Assessment     ICD-10-CM   1. Chronic combined systolic and diastolic congestive heart failure (HCC)  I50.42 EKG 12-Lead  2. Resistant hypertension  I10 VAS US RENAL ARTERY DUPLEX  3. Biventricular failure (HCC)  I50.82   4. Dilated cardiomyopathy ([place])  I42.0   5. Tobacco dependence due to cigarettes  F17.210   6. Iron deficiency anemia due to chronic blood loss  D50.0      Medications Discontinued During This Encounter  Medication Reason  . megestrol (MEGACE) 40 MG tablet Error  . isosorbide mononitrate (IMDUR) 60 MG 24 hr tablet Change in therapy    Meds ordered this encounter  Medications  . isosorbide dinitrate (ISORDIL) 30 MG tablet    Sig: Take 2 tablets (60 mg total) by mouth 3 (three) times daily.    Dispense:  180 tablet    Refill:  3    Recommendations:   Asher Babilonia is a 45 y.o. 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  She also has history of large uterine fibroids and iron deficiency anemia secondary to menorrhagia as well as history of  psychosis.    Patient presents to establish with our office for primary cardiology care.  She is currently tolerating guideline directed medical therapy for heart failure.  However her blood pressure remains markedly elevated.  Therefore we will switch patient from isosorbide mononitrate to isosorbide dinitrate 60 mg 3 times daily in combination with hydralazine.  We will continue other present cardiovascular medications.  As patient remains volume overloaded on exam, will contact Dr. Haroldine Laws for further recommendations regarding management of heart failure as well as diuretic therapy. Will reach directly to Dr. Haroldine Laws  to discuss potential inpatient treatment option.   Discussed with patient management options regarding heart failure and hypertension including inpatient hospitalization for medication titration in order to optimize her for surgery to remove uterine fibroid.  Patient is on multiple antihypertensive medications with continued markedly elevated blood pressure, suspect secondary hypertension, therefore will obtain renal artery duplex.   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  Discussed at length with patient regarding diet and lifestyle modifications as well as smoking cessation in order to reduce cardiovascular risk.  Follow-up in 3 weeks, sooner if needed, for heart failure and hypertension.  Patient was seen in collaboration with Dr. Einar Gip. He also reviewed patient's chart and examined the patient. Dr. Einar Gip is in agreement of the plan.   During this visit I reviewed and updated: Tobacco history  allergies medication reconciliation  medical history  surgical history  family history  social history.  This note was created using a voice recognition software as a result there may be grammatical errors inadvertently enclosed that do not reflect the nature of this encounter. Every attempt is made to correct such errors.   Alethia Berthold, PA-C 03/09/2021, 5:42 PM Office: [phone]

## 2021-03-11 NOTE — Addendum Note (Signed)
Addended by: Kela Millin on: 03/11/2021 10:25 PM   Modules accepted: Level of Service

## 2021-03-12 ENCOUNTER — Other Ambulatory Visit (HOSPITAL_COMMUNITY): Payer: Self-pay

## 2021-03-12 NOTE — Progress Notes (Signed)
Paramedicine Encounter    Patient ID: Holly Bradley, female    DOB: 1976-08-19, 45 y.o.   MRN: [id number]   Patient Care Team: Mitzi Hansen, MD as PCP - General (Internal Medicine) Vickie Epley, MD as PCP - Electrophysiology (Cardiology) Freada Bergeron, MD as PCP - Cardiology (Cardiology)  Patient Active Problem List   Diagnosis Date Noted  . IDA (iron deficiency anemia) 03/01/2021  . Healthcare maintenance 03/01/2021  . Onychomycosis 03/01/2021  . Food insecurity 02/02/2021  . Psychosis ([place])   . Fibroid 01/11/2021  . Menorrhagia 01/11/2021  . Acute exacerbation of CHF (congestive heart failure) ([place]) 01/10/2021  . AKI (acute kidney injury) ([place])   . SVT (supraventricular tachycardia) ([place]) 05/01/2020  . CHF (congestive heart failure) ([place]) 05/01/2020    Current Outpatient Medications:  .  amiodarone (PACERONE) 200 MG tablet, Take 1 tablet (200 mg total) by mouth daily., Disp: 30 tablet, Rfl: 2 .  ferrous sulfate 325 (65 FE) MG tablet, Take 1 tablet (325 mg total) by mouth 2 (two) times daily with a meal., Disp: 60 tablet, Rfl: 3 .  hydrALAZINE (APRESOLINE) 100 MG tablet, Take 1 tablet (100 mg total) by mouth 3 (three) times daily., Disp: 90 tablet, Rfl: 3 .  megestrol (MEGACE) 40 MG tablet, Take 40 mg by mouth 2 (two) times daily., Disp: , Rfl:  .  metolazone (ZAROXOLYN) 2.5 MG tablet, Take 1 tablet (2.5 mg total) by mouth 2 (two) times a week. Monday and Friday, Disp: 30 tablet, Rfl: 3 .  metoprolol (TOPROL-XL) 200 MG 24 hr tablet, Take 1 tablet (200 mg total) by mouth daily. Take with or immediately following a meal., Disp: 30 tablet, Rfl: 2 .  potassium chloride SA (KLOR-CON) 20 MEQ tablet, Take 1 tablet (20 mEq total) by mouth 2 (two) times daily. With additional 40 meq with Metolazone (Mon/Fr), Disp: 75 tablet, Rfl: 3 .  sacubitril-valsartan (ENTRESTO) 97-103 MG, Take 1 tablet by mouth 2 (two) times daily., Disp: 60 tablet, Rfl: 3 .  spironolactone  (ALDACTONE) 25 MG tablet, Take 0.5 tablets (12.5 mg total) by mouth daily., Disp: 15 tablet, Rfl: 3 .  torsemide (DEMADEX) 20 MG tablet, Take 2 tablets (40 mg total) by mouth 2 (two) times daily., Disp: 120 tablet, Rfl: 2 .  ARIPiprazole (ABILIFY) 2 MG tablet, Take 2 mg by mouth daily. (Patient not taking: Reported on 03/12/2021), Disp: , Rfl:  .  isosorbide dinitrate (ISORDIL) 30 MG tablet, Take 2 tablets (60 mg total) by mouth 3 (three) times daily. (Patient not taking: Reported on 03/12/2021), Disp: 180 tablet, Rfl: 3 No Known Allergies    Social History   Socioeconomic History  . Marital status: Single    Spouse name: Not on file  . Number of children: 0  . Years of education: Not on file  . Highest education level: Not on file  Occupational History  . Not on file  Tobacco Use  . Smoking status: Current Some Day Smoker    Packs/day: 0.25    Types: Cigarettes  . Smokeless tobacco: Never Used  Vaping Use  . Vaping Use: Never used  Substance and Sexual Activity  . Alcohol use: Not Currently    Comment: Occasionally  . Drug use: Not Currently    Types: Marijuana  . Sexual activity: Not on file  Other Topics Concern  . Not on file  Social History Narrative  . Not on file   Social Determinants of Health   Financial Resource Strain: High Risk  .  Difficulty of Paying Living Expenses: Very hard  Food Insecurity: No Food Insecurity  . Worried About Charity fundraiser in the Last Year: Never true  . Ran Out of Food in the Last Year: Never true  Transportation Needs: No Transportation Needs  . Lack of Transportation (Medical): No  . Lack of Transportation (Non-Medical): No  Physical Activity: Not on file  Stress: Not on file  Social Connections: Not on file  Intimate Partner Violence: Not on file    Physical Exam      Future Appointments  Date Time Provider [place]  03/20/2021 11:30 AM [facility] PA/NP [facility] None  03/26/2021  1:55 PM Griffin Basil, MD  [facility]  04/16/2021 11:30 AM Adrian Prows, MD PCV-PC[facility] None    BP (!) 214/80   Pulse 78   Resp 18   SpO2 99%   Weight yesterday-156 Last visit weight-152  Pt went to ob/gyn last week, wants her back in 1 week for potential biopsy. Her b/p was very high-they sent her to hosp pov due to her refusing to call for EMS for transport.  She left without being seen. She said her b/p was trending down but I couldn't find any results about that.  She did go to her PCP and he wants to change her isosorbide mono to dinitrate but she has not p/u that med yet.  She did get the megace for her vag bleeding.  She did take her meds this morning around 7am. She reports feeling ok, still some sob.  Slight edema to her legs/ankles. Her weight is up 4lbs from last week.  She prefers to not place the megace in the pill box. It was sent to [place] and she was able to get it. Still does not have the abilify.  She does not have the isosorbide dinitrate yet-looks like it was sent in to [place]. Will see if that can be transferred to HF outpt fund.  Today was her metolazone day so that should pull more fluid off of her as well.  She did no weigh yet this morning.  Pt denies c/p, no dizziness. -she will need the iron tablets  Her b/p is still high. Sent message to triage nurse regarding this and jasmine will forward this to Dr. Sung Amabile regarding plan.    [name], [place] [facility] Paramedic  03/12/21

## 2021-03-15 ENCOUNTER — Telehealth (HOSPITAL_COMMUNITY): Payer: Self-pay | Admitting: Licensed Clinical Social Worker

## 2021-03-15 NOTE — Telephone Encounter (Signed)
CSW called pt to discuss new womens' "Heart Strong" support group.  Unable to reach patient- left VM requesting return call to discuss and gauge interest in being added to the mailing/reminder list for support group.  CSW will continue to follow and assist as needed  Jorge Ny, [place] [facility] Desk#: [phone] Cell#: [phone]

## 2021-03-19 ENCOUNTER — Other Ambulatory Visit (HOSPITAL_COMMUNITY): Payer: Self-pay

## 2021-03-19 ENCOUNTER — Telehealth (HOSPITAL_COMMUNITY): Payer: Self-pay | Admitting: Pharmacy Technician

## 2021-03-19 NOTE — Telephone Encounter (Signed)
Advanced Heart Failure Patient Advocate Encounter   Patient was approved to receive [employer] from [employer]  Patient ID: [id number] Effective dates: 03/09/21 through 03/09/22  Called and left the patient message.   Charlann Boxer, CPhT

## 2021-03-19 NOTE — Progress Notes (Signed)
Paramedicine Encounter    Patient ID: Shaunta Oncale, female    DOB: [birth year], 45 y.o.   MRN: [id number]   Patient Care Team: Mitzi Hansen, MD as PCP - General (Internal Medicine) Vickie Epley, MD as PCP - Electrophysiology (Cardiology) Freada Bergeron, MD as PCP - Cardiology (Cardiology)  Patient Active Problem List   Diagnosis Date Noted  . IDA (iron deficiency anemia) 03/01/2021  . Healthcare maintenance 03/01/2021  . Onychomycosis 03/01/2021  . Food insecurity 02/02/2021  . Psychosis ([place])   . Fibroid 01/11/2021  . Menorrhagia 01/11/2021  . Acute exacerbation of CHF (congestive heart failure) ([place]) 01/10/2021  . AKI (acute kidney injury) ([place])   . SVT (supraventricular tachycardia) ([place]) 05/01/2020  . CHF (congestive heart failure) ([place]) 05/01/2020    Current Outpatient Medications:  .  amiodarone (PACERONE) 200 MG tablet, Take 1 tablet (200 mg total) by mouth daily., Disp: 30 tablet, Rfl: 2 .  ferrous sulfate 325 (65 FE) MG tablet, Take 1 tablet (325 mg total) by mouth 2 (two) times daily with a meal., Disp: 60 tablet, Rfl: 3 .  hydrALAZINE (APRESOLINE) 100 MG tablet, Take 1 tablet (100 mg total) by mouth 3 (three) times daily., Disp: 90 tablet, Rfl: 3 .  megestrol (MEGACE) 40 MG tablet, Take 40 mg by mouth 2 (two) times daily., Disp: , Rfl:  .  metolazone (ZAROXOLYN) 2.5 MG tablet, Take 1 tablet (2.5 mg total) by mouth 2 (two) times a week. Monday and Friday, Disp: 30 tablet, Rfl: 3 .  metoprolol (TOPROL-XL) 200 MG 24 hr tablet, Take 1 tablet (200 mg total) by mouth daily. Take with or immediately following a meal., Disp: 30 tablet, Rfl: 2 .  potassium chloride SA (KLOR-CON) 20 MEQ tablet, Take 1 tablet (20 mEq total) by mouth 2 (two) times daily. With additional 40 meq with Metolazone (Mon/Fr), Disp: 75 tablet, Rfl: 3 .  sacubitril-valsartan (ENTRESTO) 97-103 MG, Take 1 tablet by mouth 2 (two) times daily., Disp: 60 tablet, Rfl: 3 .  spironolactone  (ALDACTONE) 25 MG tablet, Take 0.5 tablets (12.5 mg total) by mouth daily., Disp: 15 tablet, Rfl: 3 .  torsemide (DEMADEX) 20 MG tablet, Take 2 tablets (40 mg total) by mouth 2 (two) times daily., Disp: 120 tablet, Rfl: 2 .  ARIPiprazole (ABILIFY) 2 MG tablet, Take 2 mg by mouth daily. (Patient not taking: No sig reported), Disp: , Rfl:  .  isosorbide dinitrate (ISORDIL) 30 MG tablet, Take 2 tablets (60 mg total) by mouth 3 (three) times daily. (Patient not taking: No sig reported), Disp: 180 tablet, Rfl: 3 No Known Allergies    Social History   Socioeconomic History  . Marital status: Single    Spouse name: Not on file  . Number of children: 0  . Years of education: Not on file  . Highest education level: Not on file  Occupational History  . Not on file  Tobacco Use  . Smoking status: Current Some Day Smoker    Packs/day: 0.25    Types: Cigarettes  . Smokeless tobacco: Never Used  Vaping Use  . Vaping Use: Never used  Substance and Sexual Activity  . Alcohol use: Not Currently    Comment: Occasionally  . Drug use: Not Currently    Types: Marijuana  . Sexual activity: Not on file  Other Topics Concern  . Not on file  Social History Narrative  . Not on file   Social Determinants of Health   Financial Resource Strain: High Risk  .  Difficulty of Paying Living Expenses: Very hard  Food Insecurity: No Food Insecurity  . Worried About Charity fundraiser in the Last Year: Never true  . Ran Out of Food in the Last Year: Never true  Transportation Needs: No Transportation Needs  . Lack of Transportation (Medical): No  . Lack of Transportation (Non-Medical): No  Physical Activity: Not on file  Stress: Not on file  Social Connections: Not on file  Intimate Partner Violence: Not on file    Physical Exam      Future Appointments  Date Time Provider [place]  03/20/2021 11:30 AM [facility] PA/NP [facility] None  03/26/2021  1:55 PM Griffin Basil, MD [facility]   04/16/2021 11:30 AM Adrian Prows, MD PCV-PCV None    BP (!) 206/80   Pulse 100   Resp 18   Wt 154 lb (69.9 kg)   SpO2 99%   BMI 24.86 kg/m   Weight yesterday-154 Last visit weight-152  Pt reports she is feeling ok. She denies increased sob. She denies c/p, no dizziness. She has stairs in condo-no issues going up and down stairs.  She did take her meds this morning.  She has appoint tomor and will call for transportation tomor for that.  She has only 1 metoprolol left-that was placed for Tuesday.   -refills needed-- Metoprolol, spiro for this week and metolazone for next week.  Will get that handled tomor and get those sent in to HF fund.  Her vag bleeding has slowed down since taking that megestrol.    [name], [place] [facility] Paramedic  03/19/21

## 2021-03-20 ENCOUNTER — Other Ambulatory Visit: Payer: Self-pay

## 2021-03-20 ENCOUNTER — Ambulatory Visit (HOSPITAL_COMMUNITY)
Admission: RE | Admit: 2021-03-20 | Discharge: 2021-03-20 | Disposition: A | Discharge status: Home / Self Care | Payer: BLUE CROSS/BLUE SHIELD | Source: Ambulatory Visit | Attending: Cardiology | Admitting: Cardiology

## 2021-03-20 ENCOUNTER — Other Ambulatory Visit (HOSPITAL_COMMUNITY): Payer: Self-pay | Admitting: Cardiology

## 2021-03-20 ENCOUNTER — Encounter (HOSPITAL_COMMUNITY): Payer: Self-pay

## 2021-03-20 ENCOUNTER — Other Ambulatory Visit (HOSPITAL_COMMUNITY): Payer: Self-pay

## 2021-03-20 VITALS — BP 220/106 | HR 85 | Wt 153.6 lb

## 2021-03-20 DIAGNOSIS — I471 Supraventricular tachycardia: Secondary | ICD-10-CM | POA: Insufficient documentation

## 2021-03-20 DIAGNOSIS — I313 Pericardial effusion (noninflammatory): Secondary | ICD-10-CM | POA: Diagnosis not present

## 2021-03-20 DIAGNOSIS — D509 Iron deficiency anemia, unspecified: Secondary | ICD-10-CM | POA: Insufficient documentation

## 2021-03-20 DIAGNOSIS — F17210 Nicotine dependence, cigarettes, uncomplicated: Secondary | ICD-10-CM | POA: Diagnosis not present

## 2021-03-20 DIAGNOSIS — D259 Leiomyoma of uterus, unspecified: Secondary | ICD-10-CM | POA: Diagnosis not present

## 2021-03-20 DIAGNOSIS — Z79899 Other long term (current) drug therapy: Secondary | ICD-10-CM | POA: Diagnosis not present

## 2021-03-20 DIAGNOSIS — I10 Essential (primary) hypertension: Secondary | ICD-10-CM

## 2021-03-20 DIAGNOSIS — N1832 Chronic kidney disease, stage 3b: Secondary | ICD-10-CM | POA: Diagnosis not present

## 2021-03-20 DIAGNOSIS — I130 Hypertensive heart and chronic kidney disease with heart failure and stage 1 through stage 4 chronic kidney disease, or unspecified chronic kidney disease: Secondary | ICD-10-CM | POA: Diagnosis present

## 2021-03-20 DIAGNOSIS — N920 Excessive and frequent menstruation with regular cycle: Secondary | ICD-10-CM | POA: Insufficient documentation

## 2021-03-20 DIAGNOSIS — I5042 Chronic combined systolic (congestive) and diastolic (congestive) heart failure: Secondary | ICD-10-CM

## 2021-03-20 DIAGNOSIS — I5082 Biventricular heart failure: Secondary | ICD-10-CM | POA: Diagnosis not present

## 2021-03-20 LAB — BASIC METABOLIC PANEL
Anion gap: 9 (ref 5–15)
BUN: 15 mg/dL (ref 6–20)
CO2: 22 mmol/L (ref 22–32)
Calcium: 10.1 mg/dL (ref 8.9–10.3)
Chloride: 108 mmol/L (ref 98–111)
Creatinine, Ser: 1.09 mg/dL — ABNORMAL HIGH (ref 0.44–1.00)
GFR, Estimated: >60 mL/min (ref >60)
Glucose, Bld: 90 mg/dL (ref 70–99)
Potassium: 3.5 mmol/L (ref 3.5–5.1)
Sodium: 139 mmol/L (ref 135–145)

## 2021-03-20 MED ORDER — SPIRONOLACTONE 25 MG PO TABS
25.0000 mg | ORAL_TABLET | Freq: Every day | ORAL | 2 refills | Status: DC
  Filled 2021-04-23: qty 30, 30d supply, fill #0

## 2021-03-20 MED ORDER — ISOSORBIDE MONONITRATE ER 60 MG PO TB24: 90.0000 mg | ORAL_TABLET | Freq: Every day | ORAL | 3 refills | Status: DC

## 2021-03-20 MED FILL — SPIRONOLACTONE 25 MG TABLET: 25 | 30 days supply | Qty: 30 | Fill #0

## 2021-03-20 MED FILL — ISOSORBIDE MONONITRATE ER 6: 60 | 30 days supply | Qty: 45 | Fill #0

## 2021-03-20 NOTE — Progress Notes (Signed)
Advanced Heart Failure Progress Note   PCP: No PCP Primary Cardiologist: Dr. Johney Frame HF Cardiologist: Dr. Haroldine Laws  Reason for visit: f/u for chronic systolic heart failure and hypertension   HPI:  Holly Bradley is a 45 y.o.  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  The majority of patient's prior medical history centers around severe uterine fibroids and bleeding, also poorly controlled HTN and tobacco use. Referred to IR for embolization procedure to improve fibroid bleeding 2019 most visits she has been severely hypertensive.    Admitted in May 2021 with SVT which apparently had been diagnosed before in 2019 according to the patient. Occurred in the setting of a hgb of 4.4 due to ongoing uterine bleeding.  Additionally she was found to be in low EF CHF, secondary to SVT, marked anemia, and  poorly controlled HTN.  She was given adenosine and PRBCs she converted to NSR, placed on a beta blocker, received a minimal amount of IV lasix and was discharged..  Additionally, then added an ACEI for bp and restarted her nifedipine.   Admitted 12/2020 for dyspnea, palpitations in SVT, which terminated with adenosine.  Also hypertensive with AKI. Responded to IV lasix with improvement in renal function, transitioned to oral lasix, but renal function worsened and lasix held.  Hospitalization c/b episodes of NSVT. She was started on Imdur, Toprol XL, & amiodarone.  ED visit on 01/1021 admitted for one day by cardiology for another episode of SVT, HTN.  She spontaneously converted to NSR.  She was discharged on increased dosage of imdur 60 and hydral 50 tid.     She was seen in the [facility] for the first time, last week, on 2/23 after being referred by the [facility]. She was markedly volume overloaded and hypertensive. Weight was up 20 pounds.   She was given IV lasix and metolazone in clinic with nearly 1L of clear urine out. She was instructed to take metolazone 2.5 mg daily x 3 days. Entresto was increased to 49-51 bid and torsemide increased to 40 mg bid.   Had return f/u on 3/1. Wt was down 9 lb but she remained volume overloaded. Breathing improved. Denied resting symptoms. BP was elevated at 156/97 but markedly improved from previous visit. We increased Entresto to 97-103 mg bid and added spiro 12.5 mg bid. Also takes metolazone twice weekly along w/ BID daily torsemide.     She has since been evaluated by gynecology. They have recommended surgical intervention for large uterine fibroid. She has been started on megestrol and having less vaginal bleeding. She is on daily ferous sulfate.   She presents back to clinic today for f/u. Overall dyspnea has improved. Still notes some mild fatigue. Less vaginal bleeding. ReDs clip normal at 30% today. Her BP however remains markedly elevated, despite full reported compliance. She has paramedicine who fills pill box. Pt is adamant that she is fully compliant w/ meds. She was recently ordered by her primary cardiologist to get renal artery duplex but this has not been scheduled yet. She denies CP, no dizziness, HA of stroke like symptoms.    Review of systems complete and found to be negative unless listed in HPI.     SH:  Social History   Socioeconomic History  . Marital status: Single    Spouse name: Not on file  . Number of children: 0  .  Years of education: Not on file  . Highest education level: Not on file  Occupational History  . Not on file  Tobacco Use  . Smoking status: Current Some Day Smoker    Packs/day: 0.25    Types: Cigarettes  . Smokeless tobacco: Never Used  Vaping Use  . Vaping Use: Never used  Substance and Sexual Activity  . Alcohol use: Not Currently    Comment: Occasionally  . Drug use: Not Currently    Types: Marijuana  . Sexual activity: Not on file   Other Topics Concern  . Not on file  Social History Narrative  . Not on file   Social Determinants of Health   Financial Resource Strain: High Risk  . Difficulty of Paying Living Expenses: Very hard  Food Insecurity: No Food Insecurity  . Worried About Charity fundraiser in the Last Year: Never true  . Ran Out of Food in the Last Year: Never true  Transportation Needs: No Transportation Needs  . Lack of Transportation (Medical): No  . Lack of Transportation (Non-Medical): No  Physical Activity: Not on file  Stress: Not on file  Social Connections: Not on file  Intimate Partner Violence: Not on file   FH:  Family History  Problem Relation Age of Onset  . Dementia Mother    Past Medical History:  Diagnosis Date  . CHF (congestive heart failure) ([place])   . Chronic blood loss anemia    Related to uterine fibroids  . Essential hypertension    Poorly controlled, repeat by report only on nicardipine 90 mg  . Fibroid    Likely complicated by by menometrorrhagia and chronic anemia  . Paroxysmal SVT (supraventricular tachycardia) ([facility])    Frequent episodes, can last anywhere from 20 minutes to 12-15 hours.  Not on beta-blocker or calcium channel blocker  . Uterine fibroid     Current Outpatient Medications  Medication Sig Dispense Refill  . amiodarone (PACERONE) 200 MG tablet Take 1 tablet (200 mg total) by mouth daily. 30 tablet 2  . ARIPiprazole (ABILIFY) 2 MG tablet Take 2 mg by mouth daily. (Patient not taking: No sig reported)    . ferrous sulfate 325 (65 FE) MG tablet Take 1 tablet (325 mg total) by mouth 2 (two) times daily with a meal. 60 tablet 3  . hydrALAZINE (APRESOLINE) 100 MG tablet Take 1 tablet (100 mg total) by mouth 3 (three) times daily. 90 tablet 3  . isosorbide dinitrate (ISORDIL) 30 MG tablet Take 2 tablets (60 mg total) by mouth 3 (three) times daily. (Patient not taking: No sig reported) 180 tablet 3  . megestrol (MEGACE) 40 MG tablet Take 40 mg by mouth  2 (two) times daily.    . metolazone (ZAROXOLYN) 2.5 MG tablet Take 1 tablet (2.5 mg total) by mouth 2 (two) times a week. Monday and Friday 30 tablet 3  . metoprolol (TOPROL-XL) 200 MG 24 hr tablet Take 1 tablet (200 mg total) by mouth daily. Take with or immediately following a meal. 30 tablet 2  . potassium chloride SA (KLOR-CON) 20 MEQ tablet Take 1 tablet (20 mEq total) by mouth 2 (two) times daily. With additional 40 meq with Metolazone (Mon/Fr) 75 tablet 3  . sacubitril-valsartan (ENTRESTO) 97-103 MG Take 1 tablet by mouth 2 (two) times daily. 60 tablet 3  . spironolactone (ALDACTONE) 25 MG tablet Take 0.5 tablets (12.5 mg total) by mouth daily. 15 tablet 3  . torsemide (DEMADEX) 20 MG tablet Take 2 tablets (  40 mg total) by mouth 2 (two) times daily. 120 tablet 2   No current facility-administered medications for this encounter.    There were no vitals filed for this visit. Wt Readings from Last 3 Encounters:  03/19/21 69.9 kg (154 lb)  03/09/21 69.7 kg (153 lb 9.6 oz)  03/08/21 70.9 kg (156 lb 4.9 oz)   PHYSICAL EXAM: ReDs Clip 30%  General:  Well appearing, looks mildly anxious. No respiratory difficulty HEENT: normal Neck: supple. no JVD. Carotids 2+ bilat; no bruits. No lymphadenopathy or thyromegaly appreciated. Cor: PMI nondisplaced. Regular rate & rhythm. No rubs, gallops or murmurs. Lungs: clear Abdomen: soft, nontender, nondistended. No hepatosplenomegaly. No bruits or masses. Good bowel sounds. Extremities: no cyanosis, clubbing, rash, edema Neuro: alert & oriented x 3, cranial nerves grossly intact. moves all 4 extremities w/o difficulty. Affect pleasant.    ASSESSMENT & PLAN: 1. Chronic Combined Systolic and Diastolic CHF, Biventricular CHF - Long standing poorly controlled HTN, Atherosclerosis, SVT. - Echo (2/22) EF down to 20-25% compared to (2019) EF 40-45% . - Stable Moderate pericardial effusion. - Functional status improved after recent diuretic increase,  now NYHA Class II. ReDs Clip 30% but remains markedly hypertensive - Continue Entersto 97-103 mg bid  - Increase Spiro to 25 mg daily  - Increase Imdur to 90 mg daily  - Continue Torsemide 40 mg bid  - Continue metolazone 2.5 mg twice weekly q Mon + Friday  - Continue Toprol XL 200 mg daily. - No SGLT2i given h/o UTIs - Check BMP today and again in 7 days  - Continue w/ paramedicine to help w/ compliance   2. Paroxysmal SVT - Regular on exam. Denies palpitations  - Continue amiodarone 200 mg & metoprolol  - TSH ok (2/22)   3. HTN - remains markedly elevated. On multiple agents and reports full med compliance  - needs renal artery dopplers to r/o RAS - plan sleep study to check for OSA - Increase spiro to 25 mg daily  - Increase Imdur to 90 mg daily  - all other meds per above - ? Component of white coat hypertension. She was instructed to check BP at home and to bring a log to next f/u visit  - If BP remains elevated at f/u visit, can further increase spiro to 50 mg if renal function/K allows +/- addition of amlodipine   4. CKD 3b - Baseline SCr ~ 1.5-1.7 - BMET today and again in 7 days w/ medication adjustments   5. Menorrhagia 2/2 Uterine Fibroids w/ subsequent Iron Deficiency Anemia - followed by GYN. Now on megestrol and having less vaginal bleeding. - Plan will be Myomectomy once cardiac issues are better stablized - Continue Ferrous sulfate 325 mg bid   6. 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 - Had paranoia/psychosis in Jan 2022 admission; started on Abilify but never received psychiatric care. - Her PCP has recently placed referral to psychiatry  - Pt reports full med compliance but ? Truthfulness. I think psych issues may be affecting compliance    7. Tobacco Use: - she reports she has quit smoking   C/w paramedicine. F/u in 2 weeks to  reassess BP   Delmy Holdren, PA-C  11:25 AM

## 2021-03-20 NOTE — Patient Instructions (Signed)
INCREASE Spironolactone to 25 mg, one tab daily INCREASE Imdur to 90 mg, one and one half tab daily   Labs today We will only contact you if something comes back abnormal or we need to make some changes. Otherwise no news is good news!  Your physician recommends that you schedule a follow-up appointment in: 4 weeks  Do the following things EVERYDAY: 1) Weigh yourself in the morning before breakfast. Write it down and keep it in a log. 2) Take your medicines as prescribed 3) Eat low salt foods--Limit salt (sodium) to 2000 mg per day.  4) Stay as active as you can everyday 5) Limit all fluids for the day to less than 2 liters  At the [facility], you and your health needs are our priority. As part of our continuing mission to provide you with exceptional heart care, we have created designated Provider Care Teams. These Care Teams include your primary Cardiologist (physician) and Advanced Practice Providers (APPs- Physician Assistants and Nurse Practitioners) who all work together to provide you with the care you need, when you need it.   You may see any of the following providers on your designated Care Team at your next follow up: Marland Kitchen Dr Glori Bickers . Dr Loralie Champagne . Dr Vickki Muff . Darrick Grinder, NP . Lyda Jester, [place] . Audry Riles, PharmD   Please be sure to bring in all your medications bottles to every appointment.   If you have any questions or concerns before your next appointment please send [place] a message through [place] or call our office at [phone].    TO LEAVE A MESSAGE FOR THE NURSE SELECT OPTION 2, PLEASE LEAVE A MESSAGE INCLUDING: . YOUR NAME . DATE OF BIRTH . CALL BACK NUMBER . REASON FOR CALL**this is important as we prioritize the call backs  YOU WILL RECEIVE A CALL BACK THE SAME DAY AS LONG AS YOU CALL BEFORE 4:00 PM  Please see our updated No Show and Same Day Appointment Cancellation Policy attached to  your AVS.

## 2021-03-20 NOTE — Progress Notes (Signed)
Met with pt during clinic visit to discuss new [facility].  Pt is interested in attending and agreeable to being placed on reminder/mailing list.  Will continue to follow and assist as needed  Jorge Ny, [place] Worker [facility] Desk#: [phone] Cell#: [phone]

## 2021-03-20 NOTE — Progress Notes (Signed)
Paramedicine Encounter   Patient ID: Holly Bradley , female,   DOB: August 13, 1976,44 y.o.,  MRN: [id number]   Met patient in clinic today with provider.  B/p-220/106 B/p recheck- [id number] p-85 Weight @ clinic-153  B/p is high again today, it was noted back in the hosp admission that she had acute psychosis and hallucinations and her b/p was well controlled during that time. Looks like there was a referral sent on 3/10 but then pt denied needing further f/u and has family support.  The only thing she is out of is the abilify. I have told her to get it from [place] as it was either $7 or $10.  Will consider sleep study as well.  Needs [place] of her kidneys.  Labs done today.  It is ironic that she does not want to add the megace to her pill box.  REDS clip-30%  Increase spiro to 75m daily.  Increase imdur to 956mdaily.  She needs to contact dr gaNadyne Coombesffice to see about that [place] kidneys.  Will f/u on her meds and possibly pick them up and take them to her.   KaMarylouise StacksEMHarbor Beach/29/2022

## 2021-03-20 NOTE — Progress Notes (Signed)
Patient given home sleep study device during clinic visit.  I reviewed instructions for use and will call her once I have received confirmation that insurance prior authorization is not needed or is completed.  She acknowledges understanding.

## 2021-03-20 NOTE — Progress Notes (Signed)
ReDS Vest / Clip - 03/20/21 1100      ReDS Vest / Clip   Station Marker A    Ruler Value 28    ReDS Value Range Low volume    ReDS Actual Value 30

## 2021-03-23 ENCOUNTER — Telehealth (HOSPITAL_COMMUNITY): Payer: Self-pay

## 2021-03-23 NOTE — Telephone Encounter (Signed)
I contacted pt today to see if she would be home so I can drop off her meds-the metorpolol is 10 days too soon to fill but she is out of that--not sure what happened there-she denies taking too many.  Pt reports she is on the way to [place] today and wont be home today and I can just see her on Monday. So I will do that.   [name], EMT-Paramedic  03/23/21

## 2021-03-26 ENCOUNTER — Ambulatory Visit: Payer: BLUE CROSS/BLUE SHIELD | Admitting: Obstetrics and Gynecology

## 2021-03-26 ENCOUNTER — Other Ambulatory Visit (HOSPITAL_COMMUNITY): Payer: Self-pay

## 2021-03-26 NOTE — Progress Notes (Signed)
Paramedicine Encounter    Patient ID: Holly Bradley, female    DOB: December 28, 1975, 45 y.o.   MRN: [id number]   Patient Care Team: Mitzi Hansen, MD as PCP - General (Internal Medicine) Vickie Epley, MD as PCP - Electrophysiology (Cardiology) Freada Bergeron, MD as PCP - Cardiology (Cardiology)  Patient Active Problem List   Diagnosis Date Noted  . IDA (iron deficiency anemia) 03/01/2021  . Healthcare maintenance 03/01/2021  . Onychomycosis 03/01/2021  . Food insecurity 02/02/2021  . Psychosis ([place])   . Fibroid 01/11/2021  . Menorrhagia 01/11/2021  . Acute exacerbation of CHF (congestive heart failure) ([place]) 01/10/2021  . AKI (acute kidney injury) ([place])   . SVT (supraventricular tachycardia) ([place]) 05/01/2020  . CHF (congestive heart failure) ([place]) 05/01/2020    Current Outpatient Medications:  .  amiodarone (PACERONE) 200 MG tablet, Take 1 tablet (200 mg total) by mouth daily., Disp: 30 tablet, Rfl: 2 .  amiodarone (PACERONE) 200 MG tablet, TAKE 1 TABLET (200 MG TOTAL) BY MOUTH DAILY., Disp: 30 tablet, Rfl: 2 .  ferrous sulfate 325 (65 FE) MG tablet, Take 1 tablet (325 mg total) by mouth 2 (two) times daily with a meal., Disp: 60 tablet, Rfl: 3 .  hydrALAZINE (APRESOLINE) 100 MG tablet, Take 1 tablet (100 mg total) by mouth 3 (three) times daily., Disp: 90 tablet, Rfl: 3 .  isosorbide mononitrate (IMDUR) 60 MG 24 hr tablet, Take 1.5 tablets (90 mg total) by mouth daily., Disp: 45 tablet, Rfl: 3 .  isosorbide mononitrate (IMDUR) 60 MG 24 hr tablet, TAKE 1 TABLET (60 MG TOTAL) BY MOUTH DAILY., Disp: 30 tablet, Rfl: 2 .  megestrol (MEGACE) 40 MG tablet, Take 40 mg by mouth 2 (two) times daily., Disp: , Rfl:  .  metolazone (ZAROXOLYN) 2.5 MG tablet, Take 1 tablet (2.5 mg total) by mouth 2 (two) times a week. Monday and Friday, Disp: 30 tablet, Rfl: 3 .  metoprolol (TOPROL-XL) 200 MG 24 hr tablet, Take 1 tablet (200 mg total) by mouth daily. Take with or immediately following  a meal., Disp: 30 tablet, Rfl: 2 .  potassium chloride SA (KLOR-CON) 20 MEQ tablet, Take 1 tablet (20 mEq total) by mouth 2 (two) times daily. With additional 40 meq with Metolazone (Mon/Fr), Disp: 75 tablet, Rfl: 3 .  sacubitril-valsartan (ENTRESTO) 97-103 MG, Take 1 tablet by mouth 2 (two) times daily., Disp: 60 tablet, Rfl: 3 .  spironolactone (ALDACTONE) 25 MG tablet, Take 1 tablet (25 mg total) by mouth daily., Disp: 30 tablet, Rfl: 3 .  torsemide (DEMADEX) 20 MG tablet, TAKE 2 TABLETS (40 MG TOTAL) BY MOUTH 2 (TWO) TIMES DAILY., Disp: 120 tablet, Rfl: 2 .  ARIPiprazole (ABILIFY) 2 MG tablet, Take 2 mg by mouth daily. (Patient not taking: No sig reported), Disp: , Rfl:  .  hydrALAZINE (APRESOLINE) 100 MG tablet, TAKE 1 TABLET (100 MG TOTAL) BY MOUTH 3 (THREE) TIMES DAILY., Disp: 90 tablet, Rfl: 3 .  isosorbide mononitrate (IMDUR) 60 MG 24 hr tablet, TAKE 1 & 1/2 TABLETS BY MOUTH ONCE DAILY., Disp: 45 tablet, Rfl: 3 .  metolazone (ZAROXOLYN) 2.5 MG tablet, TAKE 1 TABLET (2.5 MG TOTAL) BY MOUTH 2 (TWO) TIMES A WEEK. [place] (Patient not taking: Reported on 03/26/2021), Disp: 30 tablet, Rfl: 3 .  metoprolol (TOPROL-XL) 200 MG 24 hr tablet, TAKE 1 TABLET (200 MG TOTAL) BY MOUTH DAILY. TAKE WITH OR IMMEDIATELY FOLLOWING A MEAL., Disp: 30 tablet, Rfl: 2 .  potassium chloride SA (KLOR-CON) 20  MEQ tablet, TAKE 1 TABLET (20 MEQ TOTAL) BY MOUTH 2 (TWO) TIMES DAILY. WITH ADDITIONAL 40 MEQ WITH METOLAZONE (MON/FR), Disp: 75 tablet, Rfl: 3 .  spironolactone (ALDACTONE) 25 MG tablet, TAKE 0.5 TABLETS (12.5 MG TOTAL) BY MOUTH DAILY., Disp: 15 tablet, Rfl: 3 .  torsemide (DEMADEX) 20 MG tablet, Take 2 tablets (40 mg total) by mouth 2 (two) times daily., Disp: 120 tablet, Rfl: 2 .  torsemide (DEMADEX) 20 MG tablet, TAKE 1 TABLET BY MOUTH DAILY, Disp: 30 tablet, Rfl: 2 No Known Allergies    Social History   Socioeconomic History  . Marital status: Single    Spouse name: Not on file  . Number of  children: 0  . Years of education: Not on file  . Highest education level: Not on file  Occupational History  . Not on file  Tobacco Use  . Smoking status: Current Some Day Smoker    Packs/day: 0.25    Types: Cigarettes  . Smokeless tobacco: Never Used  Vaping Use  . Vaping Use: Never used  Substance and Sexual Activity  . Alcohol use: Not Currently    Comment: Occasionally  . Drug use: Not Currently    Types: Marijuana  . Sexual activity: Not on file  Other Topics Concern  . Not on file  Social History Narrative  . Not on file   Social Determinants of Health   Financial Resource Strain: High Risk  . Difficulty of Paying Living Expenses: Very hard  Food Insecurity: No Food Insecurity  . Worried About Charity fundraiser in the Last Year: Never true  . Ran Out of Food in the Last Year: Never true  Transportation Needs: No Transportation Needs  . Lack of Transportation (Medical): No  . Lack of Transportation (Non-Medical): No  Physical Activity: Not on file  Stress: Not on file  Social Connections: Not on file  Intimate Partner Violence: Not on file    Physical Exam      Future Appointments  Date Time Provider [place]  04/16/2021 11:30 AM Adrian Prows, MD PCV-PCV None  04/23/2021  3:15 PM Griffin Basil, MD [facility]  04/24/2021  3:00 PM MC-[facility] PA/NP MC-HVSC None    BP (!) 210/90   Pulse 98   Resp 18   Wt 154 lb (69.9 kg)   SpO2 98%   BMI 24.86 kg/m   Weight yesterday-154 Last visit weight-153  Pt reports she is doing ok, pt denies c/p, no dizziness, sob same.  She did take meds this morning.   -she is out of the amio, metolazone, metoprolol and hydralazine-pharmacy saying too soon to fill--will try again in a couple days.  She is also out of the ferrous sulfate-I have asked her to get it OTC.   meds verified and pill box refilled.  Those meds are listed for last refill on 3/11 so will get those refills as soon as pharmacy will fill them for  [place].  She ate grits and bacon for breakfast so we talked about that and foods to avoid that has high salt content in them.    [name], [facility] Paramedic  03/26/21

## 2021-03-27 ENCOUNTER — Other Ambulatory Visit (HOSPITAL_COMMUNITY): Payer: Self-pay

## 2021-03-27 MED FILL — Metoprolol Succinate Tab ER 24HR 200 MG (Tartrate Equiv): ORAL | 30 days supply | Qty: 30 | Fill #0 | Status: AC

## 2021-03-27 MED FILL — Amiodarone HCl Tab 200 MG: ORAL | 30 days supply | Qty: 30 | Fill #0 | Status: AC

## 2021-03-27 MED FILL — Hydralazine HCl Tab 100 MG: ORAL | 30 days supply | Qty: 90 | Fill #0 | Status: AC

## 2021-03-27 MED FILL — Metolazone Tab 2.5 MG: ORAL | 30 days supply | Qty: 30 | Fill #0 | Status: AC

## 2021-03-28 ENCOUNTER — Other Ambulatory Visit (HOSPITAL_COMMUNITY): Payer: Self-pay

## 2021-03-30 ENCOUNTER — Other Ambulatory Visit (HOSPITAL_COMMUNITY): Payer: Self-pay

## 2021-03-30 NOTE — Progress Notes (Signed)
I p/u her meds from pharmacy and took to her and placed in pill box. Will see her next weed.   [name], EMT-Paramedic  03/30/21

## 2021-04-02 ENCOUNTER — Other Ambulatory Visit (HOSPITAL_COMMUNITY): Payer: Self-pay

## 2021-04-02 ENCOUNTER — Encounter (HOSPITAL_COMMUNITY): Payer: Self-pay | Admitting: *Deleted

## 2021-04-02 MED FILL — Torsemide Tab 20 MG: ORAL | 30 days supply | Qty: 30 | Fill #0 | Status: CN

## 2021-04-02 MED FILL — Torsemide Tab 20 MG: ORAL | 30 days supply | Qty: 120 | Fill #0 | Status: AC

## 2021-04-03 ENCOUNTER — Telehealth (HOSPITAL_COMMUNITY): Payer: Self-pay | Admitting: Licensed Clinical Social Worker

## 2021-04-03 ENCOUNTER — Other Ambulatory Visit (HOSPITAL_COMMUNITY): Payer: Self-pay

## 2021-04-03 NOTE — Telephone Encounter (Signed)
Pt had requested letter from MD stating that she was safe to go back to work in a non strenuous/low stress position.  Clinic staff helped with letter and CSW printed and gave to [facility] Paramedic to provide to pt during next visit.  Will continue to follow and assist as needed  Jorge Ny, [place] [facility] Desk#: [phone] Cell#: [phone]

## 2021-04-04 ENCOUNTER — Other Ambulatory Visit (HOSPITAL_COMMUNITY): Payer: Self-pay

## 2021-04-04 NOTE — Progress Notes (Signed)
Paramedicine Encounter    Patient ID: Orion Vandervort, female    DOB: 23-Feb-1976, 45 y.o.   MRN: [id number]   Patient Care Team: Mitzi Hansen, MD as PCP - General (Internal Medicine) Vickie Epley, MD as PCP - Electrophysiology (Cardiology) Freada Bergeron, MD as PCP - Cardiology (Cardiology) Jorge Ny, LCSW as Social Worker (Licensed Clinical Social Worker)  Patient Active Problem List   Diagnosis Date Noted  . IDA (iron deficiency anemia) 03/01/2021  . Healthcare maintenance 03/01/2021  . Onychomycosis 03/01/2021  . Food insecurity 02/02/2021  . Psychosis ([place])   . Fibroid 01/11/2021  . Menorrhagia 01/11/2021  . Acute exacerbation of CHF (congestive heart failure) ([place]) 01/10/2021  . AKI (acute kidney injury) ([place])   . SVT (supraventricular tachycardia) ([place]) 05/01/2020  . CHF (congestive heart failure) ([place]) 05/01/2020    Current Outpatient Medications:  .  amiodarone (PACERONE) 200 MG tablet, Take 1 tablet (200 mg total) by mouth daily., Disp: 30 tablet, Rfl: 2 .  amiodarone (PACERONE) 200 MG tablet, TAKE 1 TABLET (200 MG TOTAL) BY MOUTH DAILY., Disp: 30 tablet, Rfl: 2 .  ferrous sulfate 325 (65 FE) MG tablet, Take 1 tablet (325 mg total) by mouth 2 (two) times daily with a meal., Disp: 60 tablet, Rfl: 3 .  hydrALAZINE (APRESOLINE) 100 MG tablet, Take 1 tablet (100 mg total) by mouth 3 (three) times daily., Disp: 90 tablet, Rfl: 3 .  isosorbide mononitrate (IMDUR) 60 MG 24 hr tablet, Take 1.5 tablets (90 mg total) by mouth daily., Disp: 45 tablet, Rfl: 3 .  megestrol (MEGACE) 40 MG tablet, Take 40 mg by mouth 2 (two) times daily., Disp: , Rfl:  .  metolazone (ZAROXOLYN) 2.5 MG tablet, Take 1 tablet (2.5 mg total) by mouth 2 (two) times a week. Monday and Friday, Disp: 30 tablet, Rfl: 3 .  metoprolol (TOPROL-XL) 200 MG 24 hr tablet, TAKE 1 TABLET (200 MG TOTAL) BY MOUTH DAILY. TAKE WITH OR IMMEDIATELY FOLLOWING A MEAL., Disp: 30 tablet, Rfl: 2 .  potassium  chloride SA (KLOR-CON) 20 MEQ tablet, Take 1 tablet (20 mEq total) by mouth 2 (two) times daily. With additional 40 meq with Metolazone (Mon/Fr), Disp: 75 tablet, Rfl: 3 .  sacubitril-valsartan (ENTRESTO) 97-103 MG, Take 1 tablet by mouth 2 (two) times daily., Disp: 60 tablet, Rfl: 3 .  spironolactone (ALDACTONE) 25 MG tablet, Take 1 tablet (25 mg total) by mouth daily., Disp: 30 tablet, Rfl: 3 .  torsemide (DEMADEX) 20 MG tablet, Take 2 tablets (40 mg total) by mouth 2 (two) times daily., Disp: 120 tablet, Rfl: 2 .  ARIPiprazole (ABILIFY) 2 MG tablet, Take 2 mg by mouth daily. (Patient not taking: No sig reported), Disp: , Rfl:  .  hydrALAZINE (APRESOLINE) 100 MG tablet, TAKE 1 TABLET (100 MG TOTAL) BY MOUTH 3 (THREE) TIMES DAILY., Disp: 90 tablet, Rfl: 3 .  isosorbide mononitrate (IMDUR) 60 MG 24 hr tablet, TAKE 1 & 1/2 TABLETS BY MOUTH ONCE DAILY., Disp: 45 tablet, Rfl: 3 .  isosorbide mononitrate (IMDUR) 60 MG 24 hr tablet, TAKE 1 TABLET (60 MG TOTAL) BY MOUTH DAILY., Disp: 30 tablet, Rfl: 2 .  metolazone (ZAROXOLYN) 2.5 MG tablet, TAKE 1 TABLET (2.5 MG TOTAL) BY MOUTH 2 (TWO) TIMES A WEEK. MONDAY AND FRIDAY, Disp: 30 tablet, Rfl: 3 .  metoprolol (TOPROL-XL) 200 MG 24 hr tablet, Take 1 tablet (200 mg total) by mouth daily. Take with or immediately following a meal., Disp: 30 tablet, Rfl: 2 .  potassium chloride SA (KLOR-CON) 20 MEQ tablet, TAKE 1 TABLET (20 MEQ TOTAL) BY MOUTH 2 (TWO) TIMES DAILY. WITH ADDITIONAL 40 MEQ WITH METOLAZONE (MON/FR), Disp: 75 tablet, Rfl: 3 .  spironolactone (ALDACTONE) 25 MG tablet, TAKE 0.5 TABLETS (12.5 MG TOTAL) BY MOUTH DAILY., Disp: 15 tablet, Rfl: 3 .  torsemide (DEMADEX) 20 MG tablet, TAKE 2 TABLETS (40 MG TOTAL) BY MOUTH 2 (TWO) TIMES DAILY., Disp: 120 tablet, Rfl: 2 No Known Allergies    Social History   Socioeconomic History  . Marital status: Single    Spouse name: Not on file  . Number of children: 0  . Years of education: Not on file  . Highest  education level: Not on file  Occupational History  . Not on file  Tobacco Use  . Smoking status: Current Some Day Smoker    Packs/day: 0.25    Types: Cigarettes  . Smokeless tobacco: Never Used  Vaping Use  . Vaping Use: Never used  Substance and Sexual Activity  . Alcohol use: Not Currently    Comment: Occasionally  . Drug use: Not Currently    Types: Marijuana  . Sexual activity: Not on file  Other Topics Concern  . Not on file  Social History Narrative  . Not on file   Social Determinants of Health   Financial Resource Strain: High Risk  . Difficulty of Paying Living Expenses: Very hard  Food Insecurity: No Food Insecurity  . Worried About Charity fundraiser in the Last Year: Never true  . Ran Out of Food in the Last Year: Never true  Transportation Needs: No Transportation Needs  . Lack of Transportation (Medical): No  . Lack of Transportation (Non-Medical): No  Physical Activity: Not on file  Stress: Not on file  Social Connections: Not on file  Intimate Partner Violence: Not on file    Physical Exam      Future Appointments  Date Time Provider [place]  04/16/2021 11:30 AM Adrian Prows, MD PCV-PCV None  04/20/2021  8:00 AM MC-CV HS VASC 2 - [facility] MC-HCVI VVS  04/23/2021  3:15 PM Griffin Basil, MD [facility]  04/24/2021  3:00 PM MC-[facility]SC PA/NP MC-HVSC None    BP (!) 210/100   Pulse 88   Resp 18   Wt 152 lb (68.9 kg)   SpO2 98%   BMI 24.53 kg/m   Weight yesterday-152 Last visit weight-154  Pt reports she has been feeling ok, she reports breathing is ok.  It is harder for her when she is laying down flat, she has to use 2 pillows at night time.  Pt denies c/p, reports some palpitations-last was a few days ago.  Pt denies dizziness. Pt reports she feels like her energy level could be better.  When reviewing her pill box she is missing some pills that should be there--I know I filled it correctly last week as I did a pill count as well and she had  all her meds last week and everything was filled.  I asked her straight out if she was taking some pills out and not taking all of them-she said no---I did find a few pills that was moved into an empty row--3 of the tablets was megace--she still does not want me to place them in pill box with rest of meds--she said she has to be sure she takes those as she needs them each day--I replied stating she needs all her meds every day-so again I dont put  100% thought that she is taking her meds as directed.  Her b/p still very high. Again spoke to her about the risks of high b/p and also the fact they will not do fibroid surgery with her b/p uncontrolled like that. She said she understood.  meds verified and pill box refilled. Everything was completed and filled up. Will see her again next week.    [name], [place] [facility] Paramedic  04/04/21

## 2021-04-12 ENCOUNTER — Other Ambulatory Visit (HOSPITAL_COMMUNITY): Payer: Self-pay

## 2021-04-12 ENCOUNTER — Telehealth: Payer: Self-pay

## 2021-04-12 NOTE — Telephone Encounter (Signed)
Call to [place] to request authorization for PAT sleep study CPT 95800.  Prior Josem Kaufmann is required per IVR.  Faxed prior auth request to [place] [phone]. No answer at phone number provided for patient, unable to provide patient with update of status of getting authorization.

## 2021-04-12 NOTE — Progress Notes (Signed)
Paramedicine Encounter    Patient ID: Holly Bradley, female    DOB: [birth year], 45 y.o.   MRN: [id number]   Patient Care Team: Mitzi Hansen, MD as PCP - General (Internal Medicine) Vickie Epley, MD as PCP - Electrophysiology (Cardiology) Freada Bergeron, MD as PCP - Cardiology (Cardiology) Jorge Ny, LCSW as Social Worker (Licensed Clinical Social Worker)  Patient Active Problem List   Diagnosis Date Noted  . IDA (iron deficiency anemia) 03/01/2021  . Healthcare maintenance 03/01/2021  . Onychomycosis 03/01/2021  . Food insecurity 02/02/2021  . Psychosis ([place])   . Fibroid 01/11/2021  . Menorrhagia 01/11/2021  . Acute exacerbation of CHF (congestive heart failure) ([place]) 01/10/2021  . AKI (acute kidney injury) ([place])   . SVT (supraventricular tachycardia) ([place]) 05/01/2020  . CHF (congestive heart failure) ([place]) 05/01/2020    Current Outpatient Medications:  .  amiodarone (PACERONE) 200 MG tablet, Take 1 tablet (200 mg total) by mouth daily., Disp: 30 tablet, Rfl: 2 .  ferrous sulfate 325 (65 FE) MG tablet, Take 1 tablet (325 mg total) by mouth 2 (two) times daily with a meal., Disp: 60 tablet, Rfl: 3 .  hydrALAZINE (APRESOLINE) 100 MG tablet, Take 1 tablet (100 mg total) by mouth 3 (three) times daily., Disp: 90 tablet, Rfl: 3 .  isosorbide mononitrate (IMDUR) 60 MG 24 hr tablet, Take 1.5 tablets (90 mg total) by mouth daily., Disp: 45 tablet, Rfl: 3 .  megestrol (MEGACE) 40 MG tablet, Take 40 mg by mouth 2 (two) times daily., Disp: , Rfl:  .  metolazone (ZAROXOLYN) 2.5 MG tablet, Take 1 tablet (2.5 mg total) by mouth 2 (two) times a week. Monday and Friday, Disp: 30 tablet, Rfl: 3 .  metoprolol (TOPROL-XL) 200 MG 24 hr tablet, TAKE 1 TABLET (200 MG TOTAL) BY MOUTH DAILY. TAKE WITH OR IMMEDIATELY FOLLOWING A MEAL., Disp: 30 tablet, Rfl: 2 .  potassium chloride SA (KLOR-CON) 20 MEQ tablet, Take 1 tablet (20 mEq total) by mouth 2 (two) times daily. With additional 40  meq with Metolazone (Mon/Fr), Disp: 75 tablet, Rfl: 3 .  sacubitril-valsartan (ENTRESTO) 97-103 MG, Take 1 tablet by mouth 2 (two) times daily., Disp: 60 tablet, Rfl: 3 .  spironolactone (ALDACTONE) 25 MG tablet, Take 1 tablet (25 mg total) by mouth daily., Disp: 30 tablet, Rfl: 3 .  torsemide (DEMADEX) 20 MG tablet, Take 2 tablets (40 mg total) by mouth 2 (two) times daily., Disp: 120 tablet, Rfl: 2 .  amiodarone (PACERONE) 200 MG tablet, TAKE 1 TABLET (200 MG TOTAL) BY MOUTH DAILY., Disp: 30 tablet, Rfl: 2 .  ARIPiprazole (ABILIFY) 2 MG tablet, Take 2 mg by mouth daily. (Patient not taking: No sig reported), Disp: , Rfl:  .  hydrALAZINE (APRESOLINE) 100 MG tablet, TAKE 1 TABLET (100 MG TOTAL) BY MOUTH 3 (THREE) TIMES DAILY., Disp: 90 tablet, Rfl: 3 .  isosorbide mononitrate (IMDUR) 60 MG 24 hr tablet, TAKE 1 & 1/2 TABLETS BY MOUTH ONCE DAILY., Disp: 45 tablet, Rfl: 3 .  isosorbide mononitrate (IMDUR) 60 MG 24 hr tablet, TAKE 1 TABLET (60 MG TOTAL) BY MOUTH DAILY., Disp: 30 tablet, Rfl: 2 .  metolazone (ZAROXOLYN) 2.5 MG tablet, TAKE 1 TABLET (2.5 MG TOTAL) BY MOUTH 2 (TWO) TIMES A WEEK. MONDAY AND FRIDAY, Disp: 30 tablet, Rfl: 3 .  metoprolol (TOPROL-XL) 200 MG 24 hr tablet, Take 1 tablet (200 mg total) by mouth daily. Take with or immediately following a meal., Disp: 30 tablet, Rfl: 2 .  potassium chloride SA (KLOR-CON) 20 MEQ tablet, TAKE 1 TABLET (20 MEQ TOTAL) BY MOUTH 2 (TWO) TIMES DAILY. WITH ADDITIONAL 40 MEQ WITH METOLAZONE (MON/FR), Disp: 75 tablet, Rfl: 3 .  spironolactone (ALDACTONE) 25 MG tablet, TAKE 0.5 TABLETS (12.5 MG TOTAL) BY MOUTH DAILY. (Patient not taking: Reported on 04/12/2021), Disp: 15 tablet, Rfl: 3 .  torsemide (DEMADEX) 20 MG tablet, TAKE 2 TABLETS (40 MG TOTAL) BY MOUTH 2 (TWO) TIMES DAILY. (Patient not taking: Reported on 04/12/2021), Disp: 120 tablet, Rfl: 2 No Known Allergies    Social History   Socioeconomic History  . Marital status: Single    Spouse name: Not  on file  . Number of children: 0  . Years of education: Not on file  . Highest education level: Not on file  Occupational History  . Not on file  Tobacco Use  . Smoking status: Current Some Day Smoker    Packs/day: 0.25    Types: Cigarettes  . Smokeless tobacco: Never Used  Vaping Use  . Vaping Use: Never used  Substance and Sexual Activity  . Alcohol use: Not Currently    Comment: Occasionally  . Drug use: Not Currently    Types: Marijuana  . Sexual activity: Not on file  Other Topics Concern  . Not on file  Social History Narrative  . Not on file   Social Determinants of Health   Financial Resource Strain: High Risk  . Difficulty of Paying Living Expenses: Very hard  Food Insecurity: No Food Insecurity  . Worried About Charity fundraiser in the Last Year: Never true  . Ran Out of Food in the Last Year: Never true  Transportation Needs: No Transportation Needs  . Lack of Transportation (Medical): No  . Lack of Transportation (Non-Medical): No  Physical Activity: Not on file  Stress: Not on file  Social Connections: Not on file  Intimate Partner Violence: Not on file    Physical Exam      Future Appointments  Date Time Provider [place]  04/16/2021 11:30 AM Adrian Prows, MD PCV-PCV None  04/20/2021  8:00 AM MC-CV HS VASC 2 - [facility] MC-HCVI VVS  04/23/2021  3:15 PM Griffin Basil, MD [facility]  04/24/2021  3:00 PM MC-HVSC PA/NP MC-HVSC None    BP (!) 210/82   Pulse 94   Resp 18   Wt 154 lb (69.9 kg)   SpO2 98%   BMI 24.86 kg/m   Weight yesterday-? Last visit weight-152  Pt reports feeling ok, she is able to lay flat more.  Pt denies c/p, no increased sob, no dizziness, no h/a.   Filled 2 pill boxes for 2 wks b/c I will not be here next week.  She will need amiodarone refilled and she will have to p/u and place in 2nd pill box. A note is left where it is needed.  She does have some swelling to her LE, she is planning on getting some compression  stockings soon.  Fibroids are still bleeding, the megace was working at first but she said it seems it has worn off.   Refills needed--hydralazine, isosorbide, metoprolol, potassium, spiro  Holly Bradley, [place] Paramedic  04/12/21

## 2021-04-16 ENCOUNTER — Ambulatory Visit: Payer: BLUE CROSS/BLUE SHIELD | Admitting: Cardiology

## 2021-04-20 ENCOUNTER — Encounter (HOSPITAL_COMMUNITY): Payer: BLUE CROSS/BLUE SHIELD

## 2021-04-23 ENCOUNTER — Telehealth (HOSPITAL_COMMUNITY): Payer: Self-pay

## 2021-04-23 ENCOUNTER — Ambulatory Visit: Payer: BLUE CROSS/BLUE SHIELD | Admitting: Obstetrics and Gynecology

## 2021-04-23 ENCOUNTER — Other Ambulatory Visit (HOSPITAL_COMMUNITY): Payer: Self-pay

## 2021-04-23 MED FILL — Metoprolol Succinate Tab ER 24HR 200 MG (Tartrate Equiv): ORAL | 30 days supply | Qty: 30 | Fill #1 | Status: AC

## 2021-04-23 MED FILL — Hydralazine HCl Tab 100 MG: ORAL | 30 days supply | Qty: 90 | Fill #1 | Status: AC

## 2021-04-23 MED FILL — Potassium Chloride Microencapsulated Crys ER Tab 20 mEq: ORAL | 19 days supply | Qty: 60 | Fill #0 | Status: AC

## 2021-04-23 MED FILL — Spironolactone Tab 25 MG: ORAL | 30 days supply | Qty: 15 | Fill #0 | Status: CN

## 2021-04-23 MED FILL — Isosorbide Mononitrate Tab ER 24HR 60 MG: ORAL | 30 days supply | Qty: 45 | Fill #0 | Status: AC

## 2021-04-23 NOTE — Telephone Encounter (Signed)
Contacted pt regarding cancelled appointments with providers this week and the need of home visit from me this week---she reports she is out of town this week.  She will be running out of her pill boxes on Thursday, I also called in refills for her of the ones she will be totally out of, will just have to see her next week.  Advised her to contact me when she returns.   [name], EMT-Paramedic  04/23/21

## 2021-04-24 ENCOUNTER — Encounter (HOSPITAL_COMMUNITY): Payer: BLUE CROSS/BLUE SHIELD

## 2021-04-24 NOTE — Telephone Encounter (Addendum)
05/03/2021 Call from [facility] RN, questions asked and answered.  Clinical request will be sent to Medical Director today for determination.  Pt has [place] Medicaid, reimbursement rate will be negotiated with finance.     Call to [facility] to check status of PA request for [facility] One sleep study. No follow up calls or faxes from [place] since request was initially sent on 04/12/2021.  Per [facility] Rep additional information was needed.  Additional information attached to original fax and resent to [place] for consideration.

## 2021-04-27 ENCOUNTER — Other Ambulatory Visit (HOSPITAL_COMMUNITY): Payer: Self-pay

## 2021-05-01 ENCOUNTER — Other Ambulatory Visit (HOSPITAL_COMMUNITY): Payer: Self-pay

## 2021-05-01 MED FILL — Amiodarone HCl Tab 200 MG: ORAL | 30 days supply | Qty: 30 | Fill #1 | Status: AC

## 2021-05-01 NOTE — Progress Notes (Signed)
Paramedicine Encounter    Patient ID: Kathrynne Kulinski, female    DOB: [birth year], 45 y.o.   MRN: [id number]   Patient Care Team: Mitzi Hansen, MD as PCP - General (Internal Medicine) Vickie Epley, MD as PCP - Electrophysiology (Cardiology) Freada Bergeron, MD as PCP - Cardiology (Cardiology) Jorge Ny, LCSW as Social Worker (Licensed Clinical Social Worker)  Patient Active Problem List   Diagnosis Date Noted  . IDA (iron deficiency anemia) 03/01/2021  . Healthcare maintenance 03/01/2021  . Onychomycosis 03/01/2021  . Food insecurity 02/02/2021  . Psychosis ([place])   . Fibroid 01/11/2021  . Menorrhagia 01/11/2021  . Acute exacerbation of CHF (congestive heart failure) ([place]) 01/10/2021  . AKI (acute kidney injury) ([place])   . SVT (supraventricular tachycardia) ([place]) 05/01/2020  . CHF (congestive heart failure) ([place]) 05/01/2020    Current Outpatient Medications:  .  amiodarone (PACERONE) 200 MG tablet, Take 1 tablet (200 mg total) by mouth daily., Disp: 30 tablet, Rfl: 2 .  ferrous sulfate 325 (65 FE) MG tablet, Take 1 tablet (325 mg total) by mouth 2 (two) times daily with a meal., Disp: 60 tablet, Rfl: 3 .  hydrALAZINE (APRESOLINE) 100 MG tablet, Take 1 tablet (100 mg total) by mouth 3 (three) times daily., Disp: 90 tablet, Rfl: 3 .  isosorbide mononitrate (IMDUR) 60 MG 24 hr tablet, Take 1.5 tablets (90 mg total) by mouth daily., Disp: 45 tablet, Rfl: 3 .  megestrol (MEGACE) 40 MG tablet, Take 40 mg by mouth 2 (two) times daily., Disp: , Rfl:  .  metolazone (ZAROXOLYN) 2.5 MG tablet, Take 1 tablet (2.5 mg total) by mouth 2 (two) times a week. Monday and Friday, Disp: 30 tablet, Rfl: 3 .  metoprolol (TOPROL-XL) 200 MG 24 hr tablet, TAKE 1 TABLET (200 MG TOTAL) BY MOUTH DAILY. TAKE WITH OR IMMEDIATELY FOLLOWING A MEAL., Disp: 30 tablet, Rfl: 2 .  potassium chloride SA (KLOR-CON) 20 MEQ tablet, Take 1 tablet (20 mEq total) by mouth 2 (two) times daily. With additional 40  meq with Metolazone (Mon/Fr), Disp: 75 tablet, Rfl: 3 .  sacubitril-valsartan (ENTRESTO) 97-103 MG, Take 1 tablet by mouth 2 (two) times daily., Disp: 60 tablet, Rfl: 3 .  spironolactone (ALDACTONE) 25 MG tablet, Take 1 tablet (25 mg total) by mouth daily., Disp: 30 tablet, Rfl: 2 .  torsemide (DEMADEX) 20 MG tablet, Take 2 tablets (40 mg total) by mouth 2 (two) times daily., Disp: 120 tablet, Rfl: 2 .  amiodarone (PACERONE) 200 MG tablet, TAKE 1 TABLET (200 MG TOTAL) BY MOUTH DAILY. (Patient not taking: Reported on 05/01/2021), Disp: 30 tablet, Rfl: 2 .  ARIPiprazole (ABILIFY) 2 MG tablet, Take 2 mg by mouth daily. (Patient not taking: No sig reported), Disp: , Rfl:  .  hydrALAZINE (APRESOLINE) 100 MG tablet, TAKE 1 TABLET (100 MG TOTAL) BY MOUTH 3 (THREE) TIMES DAILY. (Patient not taking: Reported on 05/01/2021), Disp: 90 tablet, Rfl: 3 .  isosorbide mononitrate (IMDUR) 60 MG 24 hr tablet, TAKE 1 & 1/2 TABLETS BY MOUTH ONCE DAILY. (Patient not taking: Reported on 05/01/2021), Disp: 45 tablet, Rfl: 3 .  metolazone (ZAROXOLYN) 2.5 MG tablet, TAKE 1 TABLET (2.5 MG TOTAL) BY MOUTH 2 (TWO) TIMES A WEEK. [place] (Patient not taking: Reported on 05/01/2021), Disp: 30 tablet, Rfl: 3 .  metoprolol (TOPROL-XL) 200 MG 24 hr tablet, Take 1 tablet (200 mg total) by mouth daily. Take with or immediately following a meal., Disp: 30 tablet, Rfl: 2 .  potassium chloride SA (KLOR-CON) 20 MEQ tablet, TAKE 1 TABLET (20 MEQ TOTAL) BY MOUTH 2 (TWO) TIMES DAILY. WITH ADDITIONAL 40 MEQ WITH METOLAZONE (MON/FR) (Patient not taking: Reported on 05/01/2021), Disp: 75 tablet, Rfl: 3 .  torsemide (DEMADEX) 20 MG tablet, TAKE 2 TABLETS (40 MG TOTAL) BY MOUTH 2 (TWO) TIMES DAILY. (Patient not taking: No sig reported), Disp: 120 tablet, Rfl: 2 No Known Allergies    Social History   Socioeconomic History  . Marital status: Single    Spouse name: Not on file  . Number of children: 0  . Years of education: Not on file  .  Highest education level: Not on file  Occupational History  . Not on file  Tobacco Use  . Smoking status: Current Some Day Smoker    Packs/day: 0.25    Types: Cigarettes  . Smokeless tobacco: Never Used  Vaping Use  . Vaping Use: Never used  Substance and Sexual Activity  . Alcohol use: Not Currently    Comment: Occasionally  . Drug use: Not Currently    Types: Marijuana  . Sexual activity: Not on file  Other Topics Concern  . Not on file  Social History Narrative  . Not on file   Social Determinants of Health   Financial Resource Strain: High Risk  . Difficulty of Paying Living Expenses: Very hard  Food Insecurity: No Food Insecurity  . Worried About Charity fundraiser in the Last Year: Never true  . Ran Out of Food in the Last Year: Never true  Transportation Needs: No Transportation Needs  . Lack of Transportation (Medical): No  . Lack of Transportation (Non-Medical): No  Physical Activity: Not on file  Stress: Not on file  Social Connections: Not on file  Intimate Partner Violence: Not on file    Physical Exam      Future Appointments  Date Time Provider [place]  05/11/2021  9:00 AM [facility] - [place] [facility]  05/23/2021  1:30 PM [facility] PA/NP [facility] None    BP (!) 200/90   Pulse (!) 102   Resp 18   SpO2 98%   Weight yesterday-? Last visit weight-154  Pt was out of town last week and a visit was not made-she did not have her pill box filled by myself. She has been taking it by the bottle. I picked up her meds from pharmacy last week for her.  She denies any issues or complaints. She denies increased sob, no dizziness, no c/p.  She has not weighed in about a week due to being gone on vacation and then she forgot when she got back a couple days ago.  She did have her meds this morning. She reports she possibly missed 1-2 days of meds.  Her HR is elevated around 140-110. Did 12-lead and other than the rate no abnormalities noted. Pulled up  previous EKG and it appeared same.  meds verified and pill box refilled.  Called in refill of amio-she will p/u today.  Advised she could use the cone txp if needed.    [name], [facility] Paramedic  05/01/21

## 2021-05-03 NOTE — Telephone Encounter (Addendum)
Call back from [facility], request for [facility] sleep study denied due to lack of Medical necessity.  Peer to Peer can be arranged (provider to provider) Ph # [phone].  Reference # [id number]. Sending staff message to ordering provider.  Pt notified and instructed to return device to [facility].  [facility] RN CCM

## 2021-05-08 ENCOUNTER — Other Ambulatory Visit (HOSPITAL_COMMUNITY): Payer: Self-pay

## 2021-05-08 NOTE — Progress Notes (Signed)
Paramedicine Encounter    Patient ID: Ethlyn Alto, female    DOB: December 03, 1976, 45 y.o.   MRN: [id number]   Patient Care Team: Mitzi Hansen, MD as PCP - General (Internal Medicine) Vickie Epley, MD as PCP - Electrophysiology (Cardiology) Freada Bergeron, MD as PCP - Cardiology (Cardiology) Jorge Ny, LCSW as Social Worker (Licensed Clinical Social Worker)  Patient Active Problem List   Diagnosis Date Noted  . IDA (iron deficiency anemia) 03/01/2021  . Healthcare maintenance 03/01/2021  . Onychomycosis 03/01/2021  . Food insecurity 02/02/2021  . Psychosis ([place])   . Fibroid 01/11/2021  . Menorrhagia 01/11/2021  . Acute exacerbation of CHF (congestive heart failure) ([place]) 01/10/2021  . AKI (acute kidney injury) ([place])   . SVT (supraventricular tachycardia) ([place]) 05/01/2020  . CHF (congestive heart failure) ([place]) 05/01/2020    Current Outpatient Medications:  .  amiodarone (PACERONE) 200 MG tablet, Take 1 tablet (200 mg total) by mouth daily., Disp: 30 tablet, Rfl: 2 .  ARIPiprazole (ABILIFY) 2 MG tablet, Take 2 mg by mouth daily., Disp: , Rfl:  .  ferrous sulfate 325 (65 FE) MG tablet, Take 1 tablet (325 mg total) by mouth 2 (two) times daily with a meal., Disp: 60 tablet, Rfl: 3 .  hydrALAZINE (APRESOLINE) 100 MG tablet, Take 1 tablet (100 mg total) by mouth 3 (three) times daily., Disp: 90 tablet, Rfl: 3 .  isosorbide mononitrate (IMDUR) 60 MG 24 hr tablet, Take 1.5 tablets (90 mg total) by mouth daily., Disp: 45 tablet, Rfl: 3 .  megestrol (MEGACE) 40 MG tablet, Take 40 mg by mouth 2 (two) times daily., Disp: , Rfl:  .  metolazone (ZAROXOLYN) 2.5 MG tablet, Take 1 tablet (2.5 mg total) by mouth 2 (two) times a week. Monday and Friday, Disp: 30 tablet, Rfl: 3 .  potassium chloride SA (KLOR-CON) 20 MEQ tablet, Take 1 tablet (20 mEq total) by mouth 2 (two) times daily. With additional 40 meq with Metolazone (Mon/Fr), Disp: 75 tablet, Rfl: 3 .  sacubitril-valsartan  (ENTRESTO) 97-103 MG, Take 1 tablet by mouth 2 (two) times daily., Disp: 60 tablet, Rfl: 3 .  spironolactone (ALDACTONE) 25 MG tablet, Take 1 tablet (25 mg total) by mouth daily., Disp: 30 tablet, Rfl: 2 .  torsemide (DEMADEX) 20 MG tablet, Take 2 tablets (40 mg total) by mouth 2 (two) times daily., Disp: 120 tablet, Rfl: 2 .  amiodarone (PACERONE) 200 MG tablet, TAKE 1 TABLET (200 MG TOTAL) BY MOUTH DAILY. (Patient not taking: Reported on 05/08/2021), Disp: 30 tablet, Rfl: 2 .  hydrALAZINE (APRESOLINE) 100 MG tablet, TAKE 1 TABLET (100 MG TOTAL) BY MOUTH 3 (THREE) TIMES DAILY. (Patient not taking: Reported on 05/08/2021), Disp: 90 tablet, Rfl: 3 .  isosorbide mononitrate (IMDUR) 60 MG 24 hr tablet, TAKE 1 & 1/2 TABLETS BY MOUTH ONCE DAILY. (Patient not taking: Reported on 05/08/2021), Disp: 45 tablet, Rfl: 3 .  metolazone (ZAROXOLYN) 2.5 MG tablet, TAKE 1 TABLET (2.5 MG TOTAL) BY MOUTH 2 (TWO) TIMES A WEEK. [place] (Patient not taking: Reported on 05/08/2021), Disp: 30 tablet, Rfl: 3 .  metoprolol (TOPROL-XL) 200 MG 24 hr tablet, Take 1 tablet (200 mg total) by mouth daily. Take with or immediately following a meal., Disp: 30 tablet, Rfl: 2 .  metoprolol (TOPROL-XL) 200 MG 24 hr tablet, TAKE 1 TABLET (200 MG TOTAL) BY MOUTH DAILY. TAKE WITH OR IMMEDIATELY FOLLOWING A MEAL. (Patient not taking: Reported on 05/08/2021), Disp: 30 tablet, Rfl: 2 .  potassium chloride SA (KLOR-CON) 20 MEQ tablet, TAKE 1 TABLET (20 MEQ TOTAL) BY MOUTH 2 (TWO) TIMES DAILY. WITH ADDITIONAL 40 MEQ WITH METOLAZONE (MON/FR) (Patient not taking: Reported on 05/08/2021), Disp: 75 tablet, Rfl: 3 .  torsemide (DEMADEX) 20 MG tablet, TAKE 2 TABLETS (40 MG TOTAL) BY MOUTH 2 (TWO) TIMES DAILY. (Patient not taking: Reported on 05/08/2021), Disp: 120 tablet, Rfl: 2 No Known Allergies    Social History   Socioeconomic History  . Marital status: Single    Spouse name: Not on file  . Number of children: 0  . Years of education:  Not on file  . Highest education level: Not on file  Occupational History  . Not on file  Tobacco Use  . Smoking status: Current Some Day Smoker    Packs/day: 0.25    Types: Cigarettes  . Smokeless tobacco: Never Used  Vaping Use  . Vaping Use: Never used  Substance and Sexual Activity  . Alcohol use: Not Currently    Comment: Occasionally  . Drug use: Not Currently    Types: Marijuana  . Sexual activity: Not on file  Other Topics Concern  . Not on file  Social History Narrative  . Not on file   Social Determinants of Health   Financial Resource Strain: High Risk  . Difficulty of Paying Living Expenses: Very hard  Food Insecurity: No Food Insecurity  . Worried About Charity fundraiser in the Last Year: Never true  . Ran Out of Food in the Last Year: Never true  Transportation Needs: No Transportation Needs  . Lack of Transportation (Medical): No  . Lack of Transportation (Non-Medical): No  Physical Activity: Not on file  Stress: Not on file  Social Connections: Not on file  Intimate Partner Violence: Not on file    Physical Exam      Future Appointments  Date Time Provider [place]  05/11/2021  9:00 AM [facility] - [place] [facility]  05/23/2021  1:30 PM [facility] PA/NP [facility] None    BP (!) 230/94   Pulse 99   Resp 18   Wt 155 lb (70.3 kg)   SpO2 99%   BMI 25.02 kg/m   Weight yesterday-154 Last visit weight-154  Pt reports she has not been feeling the best the past few days. She reports nausea past several days.  She does feel bloated and feels more sob.  No c/p, no palpitations, no dizziness,  She walked up the stairs and could hear her breathing harder-this is more than usual.  Sent message to triage nurse. Will let pt know of any changes if there are any.   She did attempt  To fill pill box up herself. She did make a couple errors but those were corrected when I checked her pill box.   [name], [facility] Paramedic  05/08/21

## 2021-05-11 ENCOUNTER — Encounter (HOSPITAL_COMMUNITY): Payer: BLUE CROSS/BLUE SHIELD

## 2021-05-15 ENCOUNTER — Other Ambulatory Visit: Payer: Self-pay

## 2021-05-15 ENCOUNTER — Other Ambulatory Visit (HOSPITAL_COMMUNITY): Payer: Self-pay

## 2021-05-15 ENCOUNTER — Inpatient Hospital Stay (HOSPITAL_COMMUNITY): Payer: Medicaid Other

## 2021-05-15 ENCOUNTER — Emergency Department (HOSPITAL_COMMUNITY): Payer: Medicaid Other

## 2021-05-15 ENCOUNTER — Inpatient Hospital Stay (HOSPITAL_COMMUNITY)
Admission: EM | Admit: 2021-05-15 | Discharge: 2021-07-03 | DRG: 003 | Disposition: A | Discharge status: Skilled Nursing Facility | Payer: Medicaid Other | Attending: Internal Medicine | Admitting: Internal Medicine

## 2021-05-15 ENCOUNTER — Encounter (HOSPITAL_COMMUNITY): Payer: Self-pay | Admitting: Neurology

## 2021-05-15 ENCOUNTER — Telehealth (HOSPITAL_COMMUNITY): Payer: Self-pay

## 2021-05-15 ENCOUNTER — Encounter: Payer: Self-pay | Admitting: Internal Medicine

## 2021-05-15 ENCOUNTER — Emergency Department (HOSPITAL_COMMUNITY): Payer: Medicaid Other | Admitting: Certified Registered Nurse Anesthetist

## 2021-05-15 ENCOUNTER — Encounter (HOSPITAL_COMMUNITY)
Admission: EM | Disposition: A | Discharge status: Skilled Nursing Facility | Payer: Self-pay | Source: Home / Self Care | Attending: Internal Medicine

## 2021-05-15 DIAGNOSIS — G9341 Metabolic encephalopathy: Secondary | ICD-10-CM | POA: Diagnosis present

## 2021-05-15 DIAGNOSIS — I161 Hypertensive emergency: Secondary | ICD-10-CM | POA: Diagnosis present

## 2021-05-15 DIAGNOSIS — B957 Other staphylococcus as the cause of diseases classified elsewhere: Secondary | ICD-10-CM | POA: Diagnosis not present

## 2021-05-15 DIAGNOSIS — R471 Dysarthria and anarthria: Secondary | ICD-10-CM | POA: Diagnosis present

## 2021-05-15 DIAGNOSIS — Z560 Unemployment, unspecified: Secondary | ICD-10-CM

## 2021-05-15 DIAGNOSIS — I471 Supraventricular tachycardia: Secondary | ICD-10-CM | POA: Diagnosis present

## 2021-05-15 DIAGNOSIS — I5042 Chronic combined systolic (congestive) and diastolic (congestive) heart failure: Secondary | ICD-10-CM | POA: Diagnosis present

## 2021-05-15 DIAGNOSIS — S066X9A Traumatic subarachnoid hemorrhage with loss of consciousness of unspecified duration, initial encounter: Secondary | ICD-10-CM | POA: Diagnosis not present

## 2021-05-15 DIAGNOSIS — N179 Acute kidney failure, unspecified: Secondary | ICD-10-CM | POA: Diagnosis present

## 2021-05-15 DIAGNOSIS — I619 Nontraumatic intracerebral hemorrhage, unspecified: Secondary | ICD-10-CM

## 2021-05-15 DIAGNOSIS — Y838 Other surgical procedures as the cause of abnormal reaction of the patient, or of later complication, without mention of misadventure at the time of the procedure: Secondary | ICD-10-CM | POA: Diagnosis not present

## 2021-05-15 DIAGNOSIS — Z6825 Body mass index (BMI) 25.0-25.9, adult: Secondary | ICD-10-CM

## 2021-05-15 DIAGNOSIS — D219 Benign neoplasm of connective and other soft tissue, unspecified: Secondary | ICD-10-CM

## 2021-05-15 DIAGNOSIS — R509 Fever, unspecified: Secondary | ICD-10-CM | POA: Diagnosis not present

## 2021-05-15 DIAGNOSIS — I69254 Hemiplegia and hemiparesis following other nontraumatic intracranial hemorrhage affecting left non-dominant side: Secondary | ICD-10-CM | POA: Diagnosis not present

## 2021-05-15 DIAGNOSIS — J15211 Pneumonia due to Methicillin susceptible Staphylococcus aureus: Secondary | ICD-10-CM | POA: Diagnosis not present

## 2021-05-15 DIAGNOSIS — I2510 Atherosclerotic heart disease of native coronary artery without angina pectoris: Secondary | ICD-10-CM | POA: Diagnosis present

## 2021-05-15 DIAGNOSIS — K5732 Diverticulitis of large intestine without perforation or abscess without bleeding: Secondary | ICD-10-CM

## 2021-05-15 DIAGNOSIS — Z79899 Other long term (current) drug therapy: Secondary | ICD-10-CM

## 2021-05-15 DIAGNOSIS — R414 Neurologic neglect syndrome: Secondary | ICD-10-CM | POA: Diagnosis present

## 2021-05-15 DIAGNOSIS — J9601 Acute respiratory failure with hypoxia: Secondary | ICD-10-CM | POA: Diagnosis not present

## 2021-05-15 DIAGNOSIS — K567 Ileus, unspecified: Secondary | ICD-10-CM | POA: Diagnosis not present

## 2021-05-15 DIAGNOSIS — I5082 Biventricular heart failure: Secondary | ICD-10-CM | POA: Diagnosis present

## 2021-05-15 DIAGNOSIS — R140 Abdominal distension (gaseous): Secondary | ICD-10-CM

## 2021-05-15 DIAGNOSIS — I63511 Cerebral infarction due to unspecified occlusion or stenosis of right middle cerebral artery: Secondary | ICD-10-CM | POA: Diagnosis not present

## 2021-05-15 DIAGNOSIS — K5901 Slow transit constipation: Secondary | ICD-10-CM | POA: Diagnosis not present

## 2021-05-15 DIAGNOSIS — I110 Hypertensive heart disease with heart failure: Secondary | ICD-10-CM | POA: Diagnosis present

## 2021-05-15 DIAGNOSIS — R52 Pain, unspecified: Secondary | ICD-10-CM

## 2021-05-15 DIAGNOSIS — I10 Essential (primary) hypertension: Secondary | ICD-10-CM | POA: Diagnosis present

## 2021-05-15 DIAGNOSIS — N939 Abnormal uterine and vaginal bleeding, unspecified: Secondary | ICD-10-CM

## 2021-05-15 DIAGNOSIS — D259 Leiomyoma of uterus, unspecified: Secondary | ICD-10-CM

## 2021-05-15 DIAGNOSIS — K529 Noninfective gastroenteritis and colitis, unspecified: Secondary | ICD-10-CM

## 2021-05-15 DIAGNOSIS — I5022 Chronic systolic (congestive) heart failure: Secondary | ICD-10-CM | POA: Diagnosis present

## 2021-05-15 DIAGNOSIS — D75839 Thrombocytosis, unspecified: Secondary | ICD-10-CM | POA: Diagnosis not present

## 2021-05-15 DIAGNOSIS — M7989 Other specified soft tissue disorders: Secondary | ICD-10-CM | POA: Diagnosis not present

## 2021-05-15 DIAGNOSIS — I428 Other cardiomyopathies: Secondary | ICD-10-CM

## 2021-05-15 DIAGNOSIS — I63311 Cerebral infarction due to thrombosis of right middle cerebral artery: Principal | ICD-10-CM | POA: Diagnosis present

## 2021-05-15 DIAGNOSIS — G936 Cerebral edema: Secondary | ICD-10-CM | POA: Diagnosis present

## 2021-05-15 DIAGNOSIS — R29810 Facial weakness: Secondary | ICD-10-CM | POA: Diagnosis present

## 2021-05-15 DIAGNOSIS — Z4659 Encounter for fitting and adjustment of other gastrointestinal appliance and device: Secondary | ICD-10-CM

## 2021-05-15 DIAGNOSIS — Z930 Tracheostomy status: Secondary | ICD-10-CM | POA: Diagnosis not present

## 2021-05-15 DIAGNOSIS — D62 Acute posthemorrhagic anemia: Secondary | ICD-10-CM | POA: Diagnosis not present

## 2021-05-15 DIAGNOSIS — F17210 Nicotine dependence, cigarettes, uncomplicated: Secondary | ICD-10-CM | POA: Diagnosis present

## 2021-05-15 DIAGNOSIS — I639 Cerebral infarction, unspecified: Secondary | ICD-10-CM

## 2021-05-15 DIAGNOSIS — Z7189 Other specified counseling: Secondary | ICD-10-CM | POA: Diagnosis not present

## 2021-05-15 DIAGNOSIS — G8194 Hemiplegia, unspecified affecting left nondominant side: Secondary | ICD-10-CM | POA: Diagnosis present

## 2021-05-15 DIAGNOSIS — I6300 Cerebral infarction due to thrombosis of unspecified precerebral artery: Secondary | ICD-10-CM | POA: Diagnosis not present

## 2021-05-15 DIAGNOSIS — Z430 Encounter for attention to tracheostomy: Secondary | ICD-10-CM | POA: Diagnosis not present

## 2021-05-15 DIAGNOSIS — E870 Hyperosmolality and hypernatremia: Secondary | ICD-10-CM | POA: Diagnosis not present

## 2021-05-15 DIAGNOSIS — D500 Iron deficiency anemia secondary to blood loss (chronic): Secondary | ICD-10-CM | POA: Diagnosis present

## 2021-05-15 DIAGNOSIS — G8190 Hemiplegia, unspecified affecting unspecified side: Secondary | ICD-10-CM

## 2021-05-15 DIAGNOSIS — K5909 Other constipation: Secondary | ICD-10-CM | POA: Diagnosis not present

## 2021-05-15 DIAGNOSIS — I9751 Accidental puncture and laceration of a circulatory system organ or structure during a circulatory system procedure: Secondary | ICD-10-CM | POA: Diagnosis not present

## 2021-05-15 DIAGNOSIS — G9340 Encephalopathy, unspecified: Secondary | ICD-10-CM | POA: Diagnosis not present

## 2021-05-15 DIAGNOSIS — Z515 Encounter for palliative care: Secondary | ICD-10-CM | POA: Diagnosis not present

## 2021-05-15 DIAGNOSIS — K5900 Constipation, unspecified: Secondary | ICD-10-CM

## 2021-05-15 DIAGNOSIS — F17200 Nicotine dependence, unspecified, uncomplicated: Secondary | ICD-10-CM | POA: Diagnosis present

## 2021-05-15 DIAGNOSIS — R7881 Bacteremia: Secondary | ICD-10-CM | POA: Diagnosis not present

## 2021-05-15 DIAGNOSIS — D250 Submucous leiomyoma of uterus: Secondary | ICD-10-CM | POA: Diagnosis not present

## 2021-05-15 DIAGNOSIS — E46 Unspecified protein-calorie malnutrition: Secondary | ICD-10-CM | POA: Diagnosis not present

## 2021-05-15 DIAGNOSIS — Z01818 Encounter for other preprocedural examination: Secondary | ICD-10-CM | POA: Diagnosis not present

## 2021-05-15 DIAGNOSIS — I63411 Cerebral infarction due to embolism of right middle cerebral artery: Secondary | ICD-10-CM | POA: Diagnosis not present

## 2021-05-15 DIAGNOSIS — Z716 Tobacco abuse counseling: Secondary | ICD-10-CM

## 2021-05-15 DIAGNOSIS — E878 Other disorders of electrolyte and fluid balance, not elsewhere classified: Secondary | ICD-10-CM | POA: Diagnosis not present

## 2021-05-15 DIAGNOSIS — I6389 Other cerebral infarction: Secondary | ICD-10-CM | POA: Diagnosis not present

## 2021-05-15 DIAGNOSIS — Z20822 Contact with and (suspected) exposure to covid-19: Secondary | ICD-10-CM | POA: Diagnosis present

## 2021-05-15 DIAGNOSIS — R1310 Dysphagia, unspecified: Secondary | ICD-10-CM | POA: Diagnosis present

## 2021-05-15 DIAGNOSIS — I169 Hypertensive crisis, unspecified: Secondary | ICD-10-CM | POA: Diagnosis not present

## 2021-05-15 DIAGNOSIS — F22 Delusional disorders: Secondary | ICD-10-CM | POA: Diagnosis present

## 2021-05-15 DIAGNOSIS — R482 Apraxia: Secondary | ICD-10-CM | POA: Diagnosis present

## 2021-05-15 DIAGNOSIS — Z79818 Long term (current) use of other agents affecting estrogen receptors and estrogen levels: Secondary | ICD-10-CM

## 2021-05-15 DIAGNOSIS — I63019 Cerebral infarction due to thrombosis of unspecified vertebral artery: Secondary | ICD-10-CM | POA: Diagnosis not present

## 2021-05-15 DIAGNOSIS — I609 Nontraumatic subarachnoid hemorrhage, unspecified: Secondary | ICD-10-CM | POA: Diagnosis not present

## 2021-05-15 DIAGNOSIS — R29709 NIHSS score 9: Secondary | ICD-10-CM | POA: Diagnosis present

## 2021-05-15 DIAGNOSIS — E785 Hyperlipidemia, unspecified: Secondary | ICD-10-CM | POA: Diagnosis present

## 2021-05-15 DIAGNOSIS — D72829 Elevated white blood cell count, unspecified: Secondary | ICD-10-CM | POA: Diagnosis not present

## 2021-05-15 DIAGNOSIS — D649 Anemia, unspecified: Secondary | ICD-10-CM | POA: Diagnosis not present

## 2021-05-15 DIAGNOSIS — E119 Type 2 diabetes mellitus without complications: Secondary | ICD-10-CM | POA: Diagnosis present

## 2021-05-15 DIAGNOSIS — M79642 Pain in left hand: Secondary | ICD-10-CM | POA: Diagnosis not present

## 2021-05-15 DIAGNOSIS — R627 Adult failure to thrive: Secondary | ICD-10-CM | POA: Diagnosis not present

## 2021-05-15 DIAGNOSIS — I5032 Chronic diastolic (congestive) heart failure: Secondary | ICD-10-CM | POA: Diagnosis not present

## 2021-05-15 DIAGNOSIS — Z781 Physical restraint status: Secondary | ICD-10-CM

## 2021-05-15 DIAGNOSIS — I63419 Cerebral infarction due to embolism of unspecified middle cerebral artery: Secondary | ICD-10-CM | POA: Diagnosis present

## 2021-05-15 HISTORY — DX: Essential (primary) hypertension: I10

## 2021-05-15 HISTORY — PX: IR ANGIOGRAM FOLLOW UP STUDY: IMG697

## 2021-05-15 HISTORY — PX: IR TRANSCATH/EMBOLIZ: IMG695

## 2021-05-15 HISTORY — PX: IR PERCUTANEOUS ART THROMBECTOMY/INFUSION INTRACRANIAL INC DIAG ANGIO: IMG6087

## 2021-05-15 HISTORY — PX: IR CT HEAD LTD: IMG2386

## 2021-05-15 HISTORY — PX: IR ANGIOGRAM SELECTIVE EACH ADDITIONAL VESSEL: IMG667

## 2021-05-15 HISTORY — PX: RADIOLOGY WITH ANESTHESIA: SHX6223

## 2021-05-15 LAB — CBC WITH DIFFERENTIAL/PLATELET
Abs Immature Granulocytes: 0.05 10*3/uL (ref 0.00–0.07)
Basophils Absolute: 0 10*3/uL (ref 0.0–0.1)
Basophils Relative: 0 %
Eosinophils Absolute: 0 10*3/uL (ref 0.0–0.5)
Eosinophils Relative: 0 %
HCT: 27.4 % — ABNORMAL LOW (ref 36.0–46.0)
Hemoglobin: 8 g/dL — ABNORMAL LOW (ref 12.0–15.0)
Immature Granulocytes: 1 %
Lymphocytes Relative: 6 %
Lymphs Abs: 0.6 10*3/uL — ABNORMAL LOW (ref 0.7–4.0)
MCH: 20.7 pg — ABNORMAL LOW (ref 26.0–34.0)
MCHC: 29.2 g/dL — ABNORMAL LOW (ref 30.0–36.0)
MCV: 70.8 fL — ABNORMAL LOW (ref 80.0–100.0)
Monocytes Absolute: 0.8 10*3/uL (ref 0.1–1.0)
Monocytes Relative: 8 %
Neutro Abs: 8.7 10*3/uL — ABNORMAL HIGH (ref 1.7–7.7)
Neutrophils Relative %: 85 %
Platelets: 356 10*3/uL (ref 150–400)
RBC: 3.87 MIL/uL (ref 3.87–5.11)
RDW: 18.9 % — ABNORMAL HIGH (ref 11.5–15.5)
WBC: 10.2 10*3/uL (ref 4.0–10.5)
nRBC: 0 % (ref 0.0–0.2)

## 2021-05-15 LAB — RESP PANEL BY RT-PCR (FLU A&B, COVID) ARPGX2
Influenza A by PCR: NEGATIVE
Influenza B by PCR: NEGATIVE
SARS Coronavirus 2 by RT PCR: NEGATIVE

## 2021-05-15 LAB — COMPREHENSIVE METABOLIC PANEL
ALT: 19 U/L (ref 0–44)
AST: 26 U/L (ref 15–41)
Albumin: 3.5 g/dL (ref 3.5–5.0)
Alkaline Phosphatase: 88 U/L (ref 38–126)
Anion gap: 11 (ref 5–15)
BUN: 17 mg/dL (ref 6–20)
CO2: 21 mmol/L — ABNORMAL LOW (ref 22–32)
Calcium: 9.3 mg/dL (ref 8.9–10.3)
Chloride: 108 mmol/L (ref 98–111)
Creatinine, Ser: 1.27 mg/dL — ABNORMAL HIGH (ref 0.44–1.00)
GFR, Estimated: 52 mL/min — ABNORMAL LOW (ref >60)
Glucose, Bld: 114 mg/dL — ABNORMAL HIGH (ref 70–99)
Potassium: 3.3 mmol/L — ABNORMAL LOW (ref 3.5–5.1)
Sodium: 140 mmol/L (ref 135–145)
Total Bilirubin: 1 mg/dL (ref 0.3–1.2)
Total Protein: 7.6 g/dL (ref 6.5–8.1)

## 2021-05-15 LAB — RAPID URINE DRUG SCREEN, HOSP PERFORMED
Amphetamines: NOT DETECTED
Barbiturates: NOT DETECTED
Benzodiazepines: NOT DETECTED
Cocaine: NOT DETECTED
Opiates: NOT DETECTED
Tetrahydrocannabinol: NOT DETECTED

## 2021-05-15 LAB — URINALYSIS, ROUTINE W REFLEX MICROSCOPIC
Bacteria, UA: NONE SEEN
Bilirubin Urine: NEGATIVE
Glucose, UA: NEGATIVE mg/dL
Ketones, ur: NEGATIVE mg/dL
Leukocytes,Ua: NEGATIVE
Nitrite: NEGATIVE
Protein, ur: 100 mg/dL — AB
Specific Gravity, Urine: 1.005 (ref 1.005–1.030)
pH: 7 (ref 5.0–8.0)

## 2021-05-15 LAB — AMMONIA: Ammonia: <9 umol/L — ABNORMAL LOW (ref 9–35)

## 2021-05-15 LAB — HEMOGLOBIN AND HEMATOCRIT, BLOOD
HCT: 26.5 % — ABNORMAL LOW (ref 36.0–46.0)
Hemoglobin: 7.8 g/dL — ABNORMAL LOW (ref 12.0–15.0)

## 2021-05-15 LAB — HIV ANTIBODY (ROUTINE TESTING W REFLEX): HIV Screen 4th Generation wRfx: NONREACTIVE

## 2021-05-15 LAB — HCG, QUANTITATIVE, PREGNANCY: hCG, Beta Chain, Quant, S: <1 m[IU]/mL (ref <5)

## 2021-05-15 LAB — MRSA PCR SCREENING: MRSA by PCR: NEGATIVE

## 2021-05-15 LAB — ETHANOL: Alcohol, Ethyl (B): <10 mg/dL (ref <10)

## 2021-05-15 LAB — ABO/RH: ABO/RH(D): O POS

## 2021-05-15 IMAGING — CT CT HEAD W/O CM
3 series · 12 of 30 positions shown, 14 images · non-contrast
Comparison: Noncontrast head CT, CT angiogram head/neck and CT
perfusion performed earlier today.
COMPARISON: Noncontrast head CT, CT angiogram head/neck and CT
perfusion performed earlier today.

Addendum:
CLINICAL DATA: Stroke, follow-up; small contrast extravasates post
MT. Contrast staining basal ganglia and frontal cortex.

EXAM:
CT HEAD WITHOUT CONTRAST
TECHNIQUE: Contiguous axial images were obtained from the base of the skull
through the vertex without intravenous contrast.

[Series 3: head wo · axial · 0.43mm/px · z∈[+1074,+1180]mm · 5 of 33 slices shown, 7 images]
[im 6/33  brain]
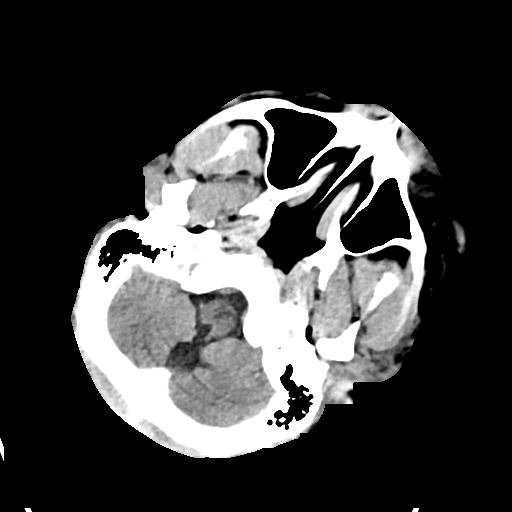
[im 6/33  bone]
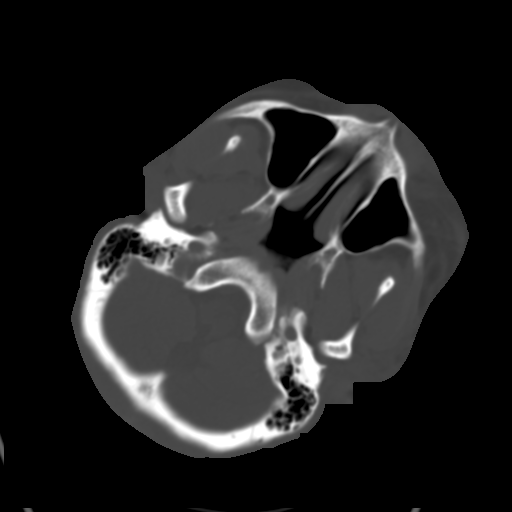
[im 11/33  brain]
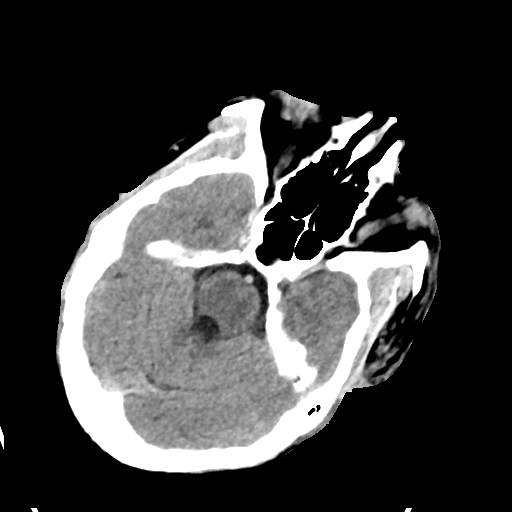
[im 17/33  brain]
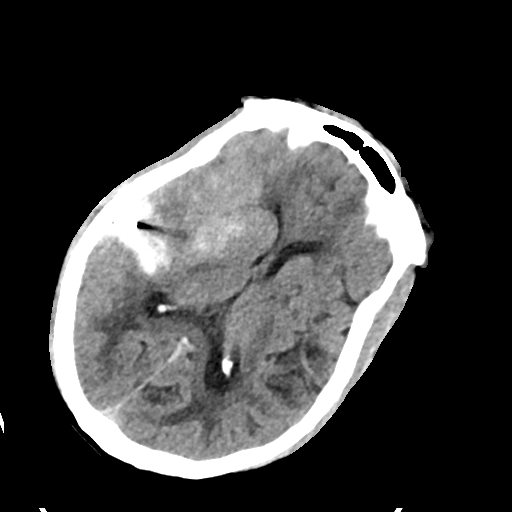
[im 22/33  brain]
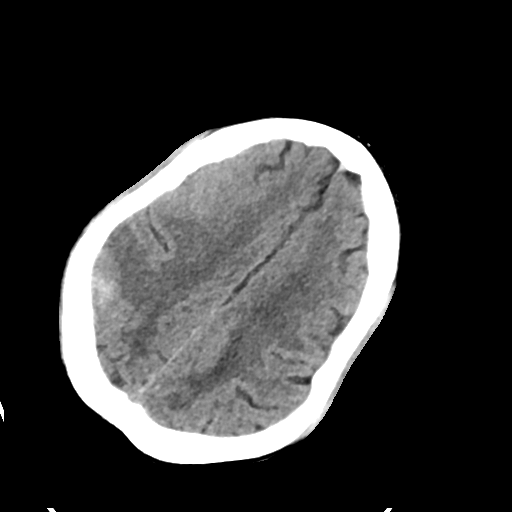
[im 27/33  brain]
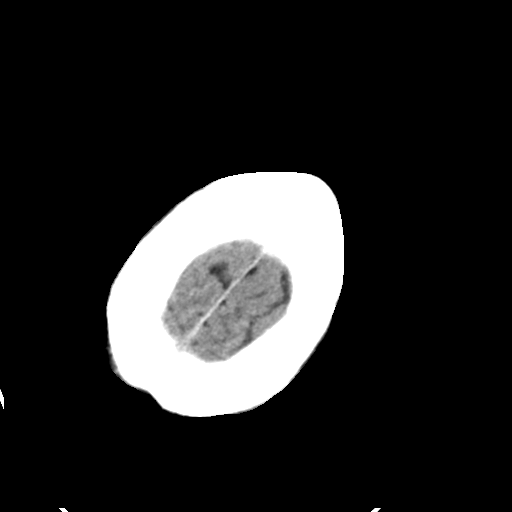
[im 27/33  bone]
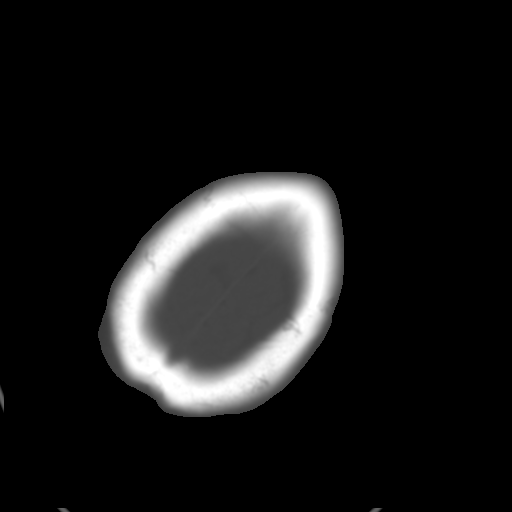

[Series 4: head bone · axial · 0.40mm/px · z∈[+1053,+1073]mm · 2 of 77 slices shown]
[im 6/77  bone]
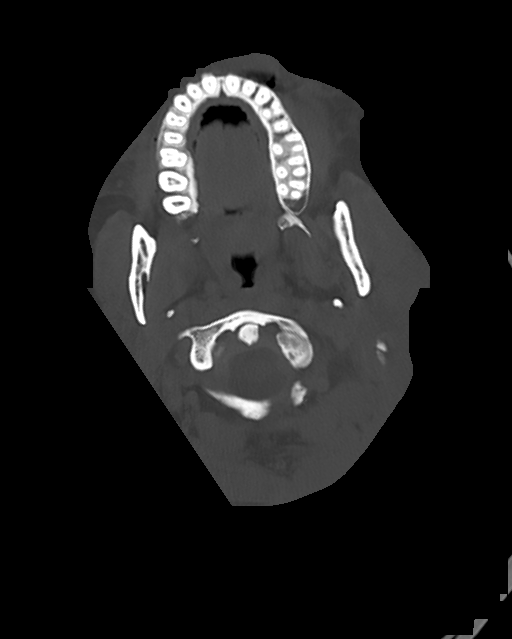
[im 16/77  bone]
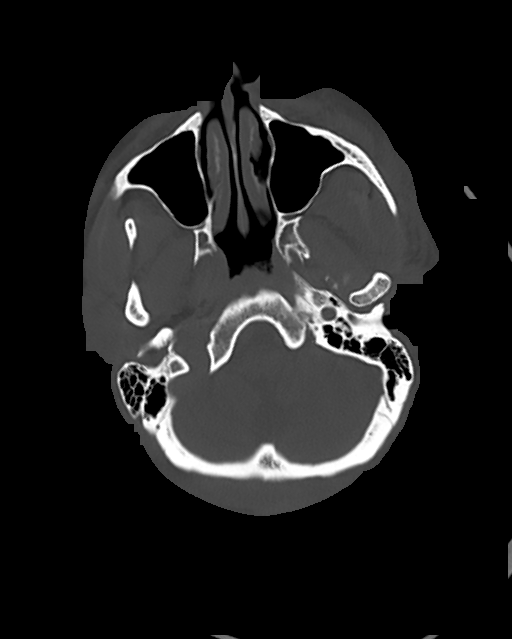

[Series 7: head · axial · 0.40mm/px · z∈[+1067,+1166]mm · 5 of 31 slices shown]
[im 6/31  brain]
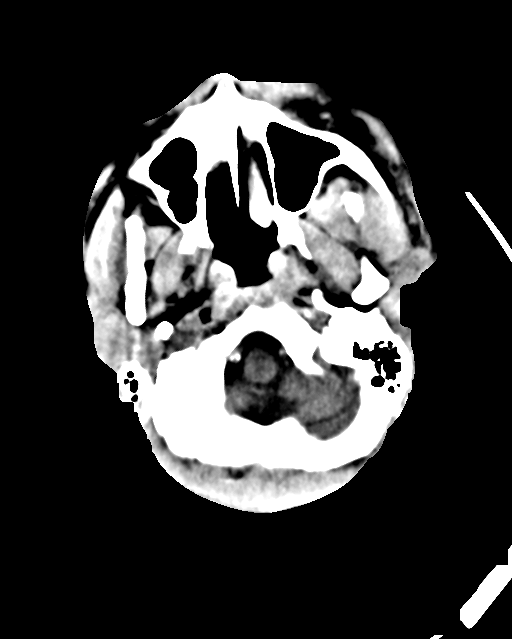
[im 11/31  brain]
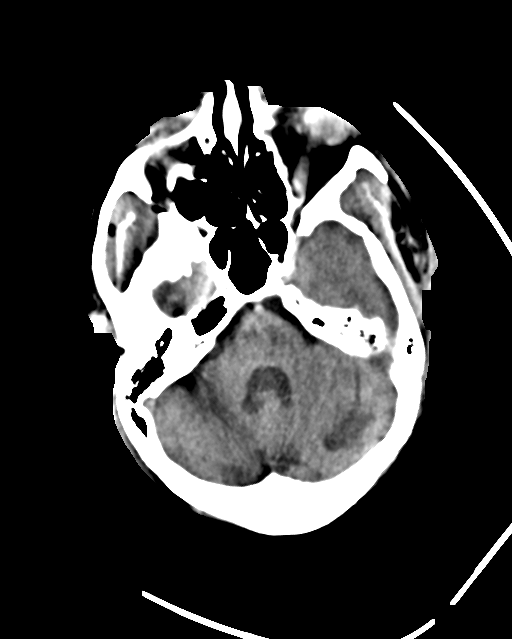
[im 16/31  brain]
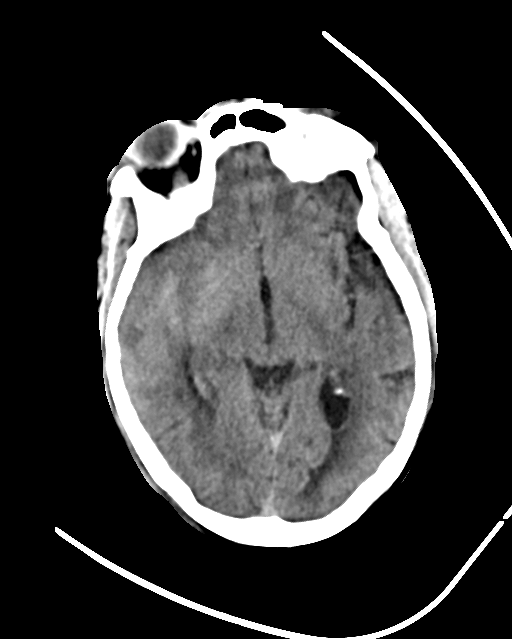
[im 21/31  brain]
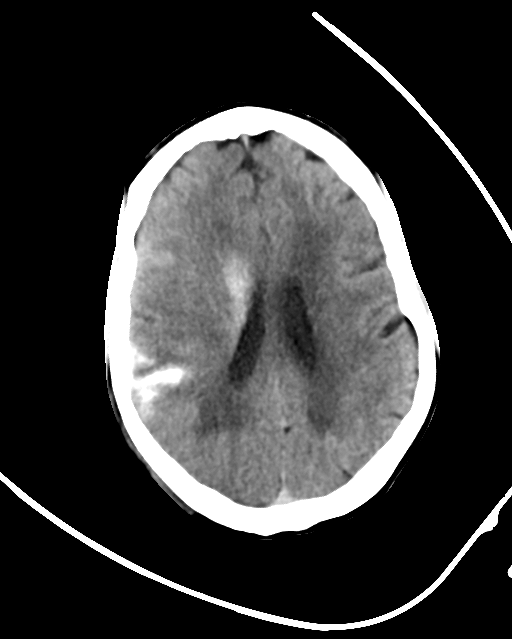
[im 26/31  brain]
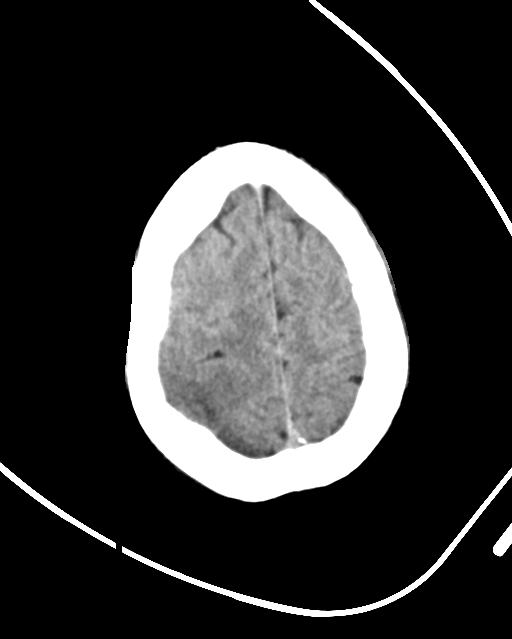

[12 of 30 positions shown; findings below may reference images not displayed]

Procedure report from
endovascular revascularization and embolization performed earlier
today [DATE].
FINDINGS: Brain:

Mild cerebral and cerebellar atrophy.

Small to moderate volume hyperdensity within the right sylvian
fissure and along portions of the right frontoparietal and temporal
lobes compatible with extravasated contrast/subarachnoid hemorrhage.
There is hyperdensity within the right basal ganglia, right insula,
lateral right frontal lobe/frontal operculum and within portions of
the right parietal lobe likely reflecting contrast staining at sites
of acute infarction. Associated mass effect with subtle partial
effacement of the right lateral ventricle. No midline shift at this
time. No evidence of hydrocephalus.

Background patchy and ill-defined hypoattenuation within the
cerebral white matter, nonspecific but compatible with chronic small
vessel ischemic disease.

Redemonstrated chronic lacunar infarcts within the left basal
ganglia.

Redemonstrated chronic infarct within the superior left cerebellar
hemisphere.

Vascular: Residual circulating contrast limits evaluation for
hyperdense vessels. Atherosclerotic calcifications. Embolization
material is now present overlying the lateral right parietal lobe,
consistent with the procedural history describing embolization of a
right M3 branch.

Skull: Normal. Negative for fracture or focal lesion.

Sinuses/Orbits: Visualized orbits show no acute finding. Trace
bilateral ethmoid sinus mucosal thickening.

Other: Redemonstrated left maxillofacial soft tissue swelling.
IMPRESSION: Small-to-moderate volume hyperdensity within the right sylvian
fissure and along portions of the right frontoparietal and temporal
lobes compatible with extravasated contrast/subarachnoid hemorrhage.

There is hyperdensity within the right basal ganglia, right insula,
lateral right frontal lobe/frontal operculum and within portions of
the right parietal lobe likely reflecting contrast staining at sites
of acute infarction.

Mild associated mass effect with subtle partial effacement of the
right lateral ventricle. No midline shift or hydrocephalus.

Embolization material now overlies the lateral right parietal lobe.

Stable background parenchymal atrophy and chronic small vessel
ischemic disease.

Redemonstrated chronic infarct within the superior left cerebellum.

ADDENDUM:
These results were called by telephone at the time of interpretation
on [DATE] at [DATE] to provider Dr. RABDO, who verbally
acknowledged these results.

*** End of Addendum ***
Procedure report from
endovascular revascularization and embolization performed earlier
today [DATE].
FINDINGS: Brain:

Mild cerebral and cerebellar atrophy.

Small to moderate volume hyperdensity within the right sylvian
fissure and along portions of the right frontoparietal and temporal
lobes compatible with extravasated contrast/subarachnoid hemorrhage.
There is hyperdensity within the right basal ganglia, right insula,
lateral right frontal lobe/frontal operculum and within portions of
the right parietal lobe likely reflecting contrast staining at sites
of acute infarction. Associated mass effect with subtle partial
effacement of the right lateral ventricle. No midline shift at this
time. No evidence of hydrocephalus.

Background patchy and ill-defined hypoattenuation within the
cerebral white matter, nonspecific but compatible with chronic small
vessel ischemic disease.

Redemonstrated chronic lacunar infarcts within the left basal
ganglia.

Redemonstrated chronic infarct within the superior left cerebellar
hemisphere.

Vascular: Residual circulating contrast limits evaluation for
hyperdense vessels. Atherosclerotic calcifications. Embolization
material is now present overlying the lateral right parietal lobe,
consistent with the procedural history describing embolization of a
right M3 branch.

Skull: Normal. Negative for fracture or focal lesion.

Sinuses/Orbits: Visualized orbits show no acute finding. Trace
bilateral ethmoid sinus mucosal thickening.

Other: Redemonstrated left maxillofacial soft tissue swelling.
IMPRESSION: Small-to-moderate volume hyperdensity within the right sylvian
fissure and along portions of the right frontoparietal and temporal
lobes compatible with extravasated contrast/subarachnoid hemorrhage.

There is hyperdensity within the right basal ganglia, right insula,
lateral right frontal lobe/frontal operculum and within portions of
the right parietal lobe likely reflecting contrast staining at sites
of acute infarction.

Mild associated mass effect with subtle partial effacement of the
right lateral ventricle. No midline shift or hydrocephalus.

Embolization material now overlies the lateral right parietal lobe.

Stable background parenchymal atrophy and chronic small vessel
ischemic disease.

Redemonstrated chronic infarct within the superior left cerebellum.

## 2021-05-15 IMAGING — XA IR PERCUTANEOUS ART THORMBECTOMY/INFUSION INTRACRANIAL INCLUDE D
11 of 15 series · 11 of 24 positions shown · IV contrast (IODINE)
Comparison: CT/CT angiogram of the head and neck [DATE].

INDICATION: 45-year-old female with past medical history significant for
uncontrolled hypertension, tobacco use disorder, uterine fibroids,
heart failure, paroxysmal SVT, paranoid psychosis presenting with
altered mental status. Head CT showed hypodensity within the right
basal ganglia concerning for possible right MCA occlusion. A CT
angiogram of the head and neck was then obtained and showed an
occlusion of the right M1/MCA segment. CT perfusion showed a core
infarct of 51 mL with a penumbra of 52 mL. Case was discussed with
patient's brother and decision was made to proceed with mechanical
thrombectomy.

EXAM:
DIAGNOSTIC CEREBRAL ANGIOGRAM
MECHANICAL THROMBECTOMY
FLAT PANEL HEAD CT
TECHNIQUE: Informed written consent was obtained from the patient's brother
after a thorough discussion of the procedural risks, benefits and
alternatives. All questions were addressed. Maximal Sterile Barrier
Technique was utilized including caps, mask, sterile gowns, sterile
gloves, sterile drape, hand hygiene and skin antiseptic. A timeout
was performed prior to the initiation of the procedure.

[Series 1: cerebral care 2 · 2 acquisitions, 1 frame shown (1 of 7)]
[im 1/2]
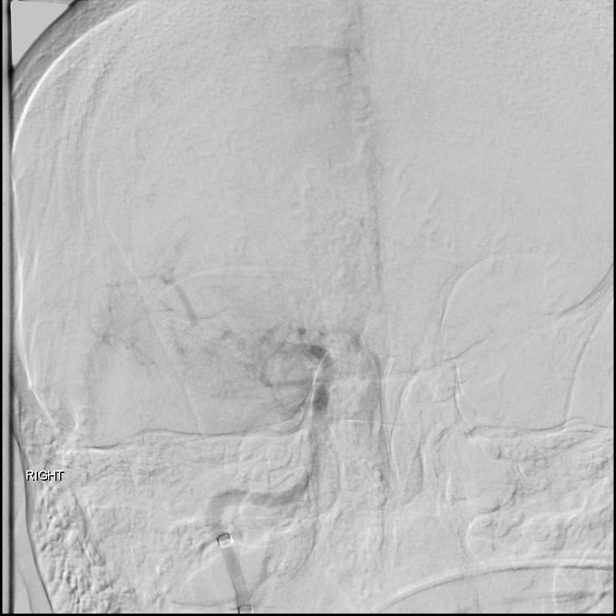

[Series 3: cerebral care 2 · 2 acquisitions, 1 frame shown (2 of 7)]
[im 1/2]
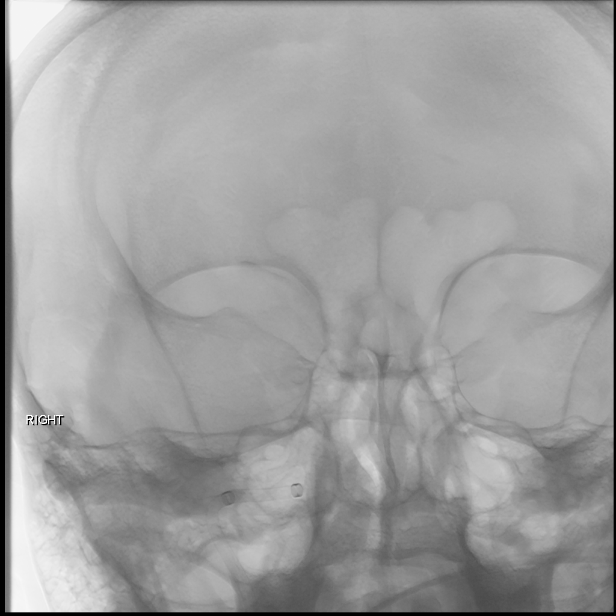

[Series 4: n roadmap · 2 acquisitions, 1 frame shown]
[im 1/2]
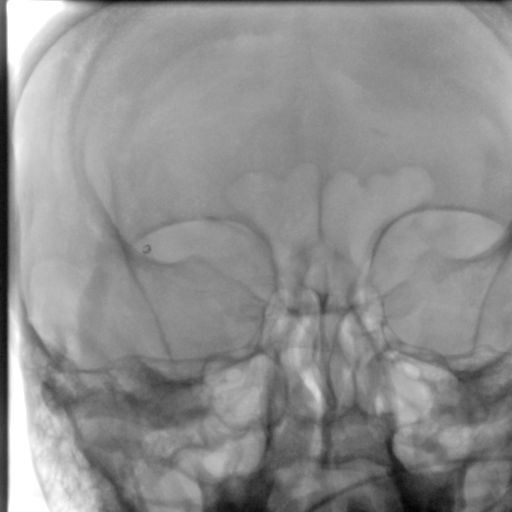

[Series 5: cerebral care 2 · 2 acquisitions, 1 frame shown (3 of 7)]
[im 1/2]
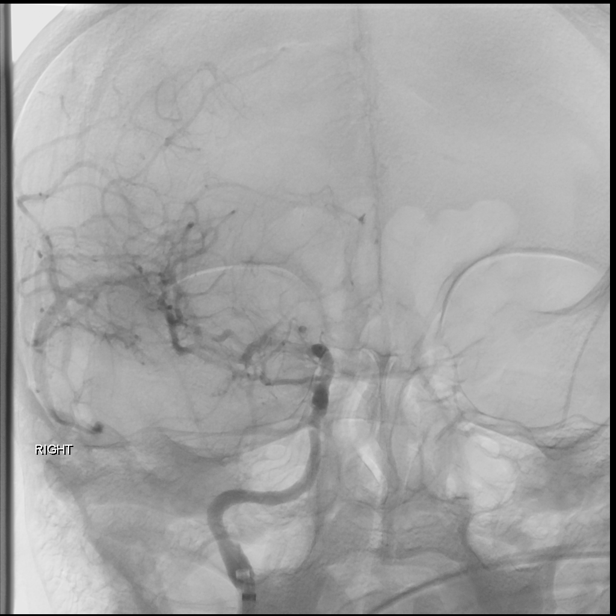

[Series 8: cerebral care 2 · 2 acquisitions, 1 frame shown (4 of 7)]
[im 1/2]
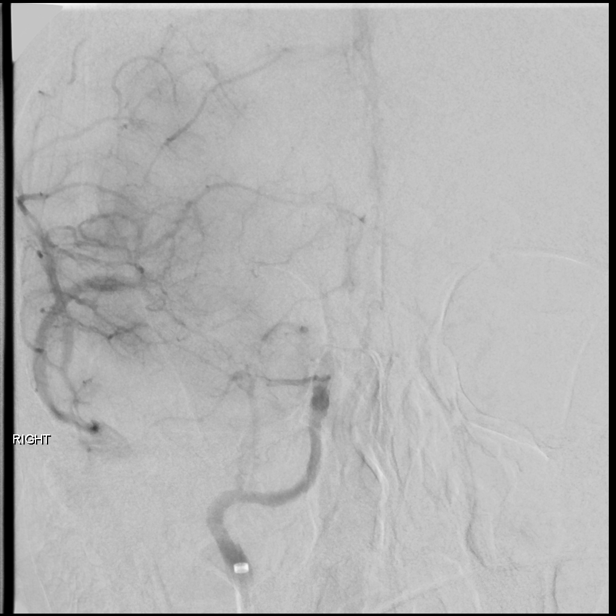

[Series 10: cerebral care 2 · 2 acquisitions, 1 frame shown (5 of 7)]
[im 1/2]
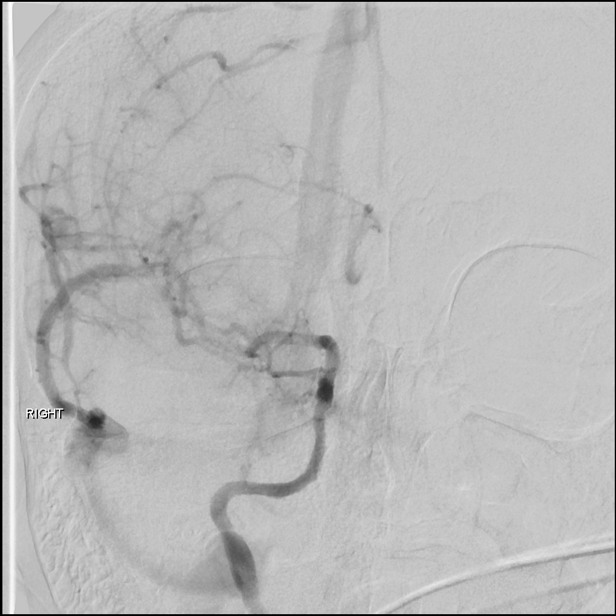

[Series 11: cerebral care 2 · 2 acquisitions, 1 frame shown (6 of 7)]
[im 1/2]
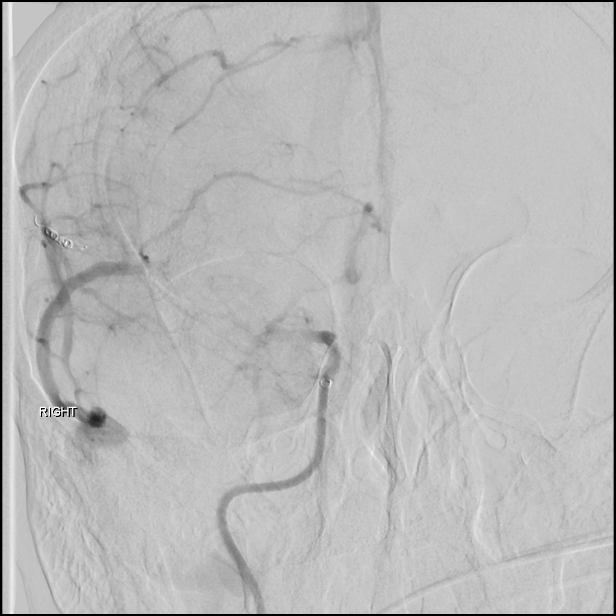

[Series 13: sagital · axial · 5.0mm · 0.40mm/px · 1 of 39 slices shown]
[im 1/39  full-range]
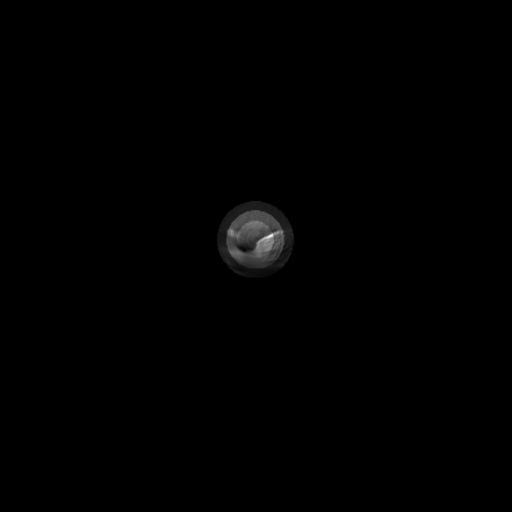

[Series 13: coronal · axial · 5.0mm · 0.29mm/px · 1 of 39 slices shown]
[im 13/39]
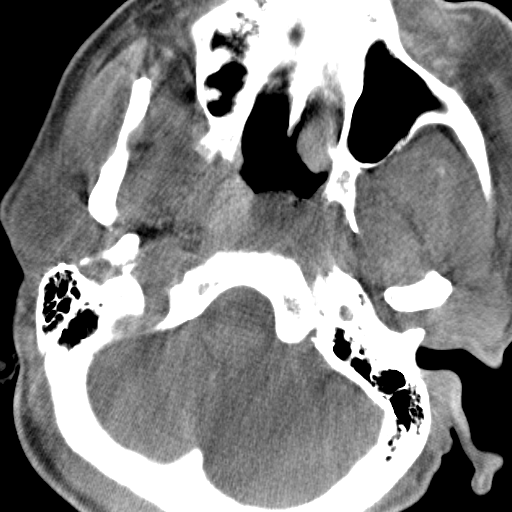

[Series 14: cerebral care 2 · 2 acquisitions, 1 frame shown (7 of 7)]
[im 1/2]
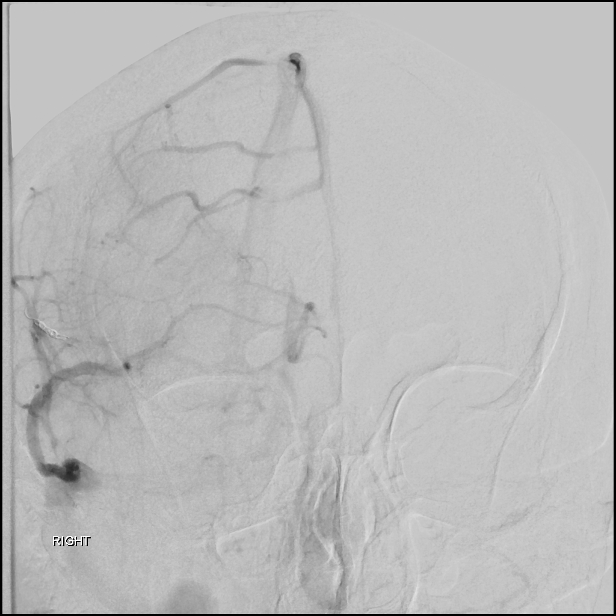

[Series 300: ir percutaneous art thrombectomy/infusio · 1 of 25 slices shown]
[im 1/25]
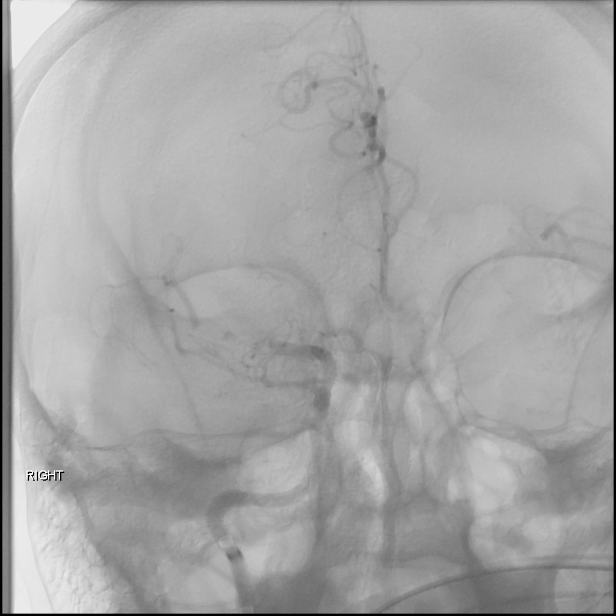

[11 of 24 positions shown; findings below may reference images not displayed]

MEDICATIONS:
5 mg of verapamil intra arterial.

ANESTHESIA/SEDATION:
Procedure was performed under general anesthesia.

CONTRAST:  75 mL of Omnipaque 240 milligram/mL

FLUOROSCOPY TIME:  Fluoroscopy Time: 21 minutes 24 seconds ([FB]
mGy).

COMPLICATIONS:
SIR LEVEL C - Requires therapy, minor hospitalization (<48 hrs).
The right groin was prepped and draped in the usual sterile fashion.
Using a micropuncture kit and the modified Seldinger technique,
access was gained to the right common femoral artery and an 8 French
sheath was placed.

Under fluoroscopy, an 8 French Walrus balloon guide catheter was
navigated over a 6 RUALES 2 catheter and a 0.035" Terumo
Glidewire into the aortic arch. The catheter was placed into the
right common carotid artery and then advanced into the right
internal carotid artery. The inner catheter was removed. Frontal and
lateral angiograms of the head were obtained.
FINDINGS: 1. There is a mid right M1/MCA occlusion.
2. Atherosclerotic changes of the right carotid bifurcation without
hemodynamically significant stenosis.

PROCEDURE:
Under biplane roadmap, a zoom 71 aspiration catheter was navigated
over a Aristotle 18 microguidewire into the cavernous segment of the
right ICA. The aspiration catheter was advanced to the level of
occlusion at the right M1 segment and connected to a penumbra
aspiration pump. The guiding catheter balloon was inflated. The
aspiration catheter was removed under constant aspiration.

Right internal carotid artery angiograms showed recanalization of
the right M1 segment with occlusion of a distal M2/MCA posterior
division branch.

Under biplane roadmap, a zoom 71 aspiration catheter was navigated
over a Aristotle 18 microguidewire into the right M1/MCA. The
aspiration catheter was advanced over the wire to the level of
occlusion at the right M2 segment and connected to a penumbra
aspiration pump. The guiding catheter balloon was inflated. The
aspiration catheter was removed under constant aspiration.

Right internal carotid artery angiograms showed recanalization of
the right M2 segment with occlusion of M3/MCA posterior division
branches.

Under biplane roadmap, a zoom 71 aspiration catheter was navigated
over a phenom 21 microcatheter and a Aristotle 18 microguidewire
into the cavernous segment of the right ICA. The microcatheter was
then navigated over the wire into the right M3/MCA. Then, a 3 mm
solitaire stent retriever was deployed spanning the M2-M3 segment.
The device was allowed to intercalated with the clot for 4 minutes.
The microcatheter was removed. The aspiration catheter was advanced
to the level of occlusion and connected to a penumbra aspiration
pump. The guiding catheter balloon was inflated. The thrombectomy
device and aspiration catheter were removed under constant
aspiration.

Right internal carotid artery angiograms showed recanalization of
the proximal right M3 segment with occlusion of distal M3/MCA
posterior division branches.

Under biplane roadmap, a zoom 71 aspiration catheter was navigated
over a phenom 21 microcatheter and a Aristotle 18 microguidewire
into the cavernous segment of the right ICA. The microcatheter was
then navigated over the wire into the right M3/MCA. Then, the
microcatheter was connected to a penumbra aspiration pump. The
guiding catheter balloon was inflated. The microcatheter was removed
under constant aspiration.

Right internal carotid artery angiograms showed persistent occlusion
of a distal M3/MCA posterior division branch.

Under biplane roadmap, a zoom 71 aspiration catheter was navigated
over a phenom 21 microcatheter and a Aristotle 18 microguidewire
into the cavernous segment of the right ICA. The microcatheter was
then navigated over the wire into the right M3/MCA. Then, the
microcatheter was connected to a penumbra aspiration pump. The
guiding catheter balloon was inflated. The microcatheter was removed
under constant aspiration.

Right internal carotid artery angiograms showed recanalization of
the distal right M3 segment with occlusion of and M4 segment.
Contrast extravasation was seen at the distal M3 level.

Under biplane roadmap, a zoom 71 aspiration catheter was navigated
over a phenom 21 microcatheter and a Aristotle 18 microguidewire
into the cavernous segment of the right ICA. The microcatheter was
then navigated over the wire into the right M3/MCA. Then, a 2 x 6 mm
microplex hypersoft coil was deployed in the distal right M3/MCA
segment.

Right internal carotid artery angiograms showed slow flow in the M3
branch with no evidence of extravasation.

The guide catheter was retracted into the right common carotid
artery. Frontal and lateral angiograms of the neck were obtained.
Atherosclerotic changes noted in the right carotid bifurcation
without hemodynamically significant stenosis or evidence of new clot
formation.

Flat panel CT of the head was obtained and post processed in a
separate workstation with concurrent attending physician
supervision. Selected images were sent to PACS. Small right RUALES
contrast extravasation confirmed. Additionally, hyperdensity of the
right basal ganglia and lateral right frontal cortex are noted,
related to ongoing ischemia.

Right internal carotid artery angiograms with frontal and lateral
views of the head showed occlusion of the embolized branch with no
evidence of contrast extravasation. Patency of the remaining MCA
branches noted.

The catheter was subsequently withdrawn.

Right common femoral artery angiograms were obtained in right
anterior oblique and lateral views. The puncture is at the level of
the mid common femoral artery which has adequate caliber for closure
device utilization. The femoral sheath was exchanged for a Perclose
pro style which was utilized for access closure. Immediate
hemostasis was achieved.
IMPRESSION: Mechanical thrombectomy performed for treatment of right M1/MCA
occlusion with clot fragmentation and embolization to same distal
territory requiring multiple additional passes. Procedure
complicated by contrast extravasation at a distal right M3/MCA
branch requiring vessel embolization. Near complete recanalization
of the right MCA vascular tree was achieved (TICI 2b).

PLAN:
- Bed rest post femoral access x6h - Follow-up CT in 4 hours to
evaluate for RUALES/contrast extravasation stability - Transfer to ICU-
SBP: 120-140 mmHg

## 2021-05-15 IMAGING — MR MR CERVICAL SPINE W/O CM
4 of 6 series · 19 of 48 positions shown · non-contrast
Comparison: None.

CLINICAL DATA: Acute or progressive myelopathy. Previous
intervention for right M1 occlusion.

EXAM:
MRI CERVICAL SPINE WITHOUT CONTRAST
TECHNIQUE: Multiplanar, multisequence MR imaging of the cervical spine was
performed. No intravenous contrast was administered.

[Series 6: T2 · sagittal · 3.0mm · 0.43mm/px · 4 of 18 slices shown (1 of 2)]
[im 1/18]
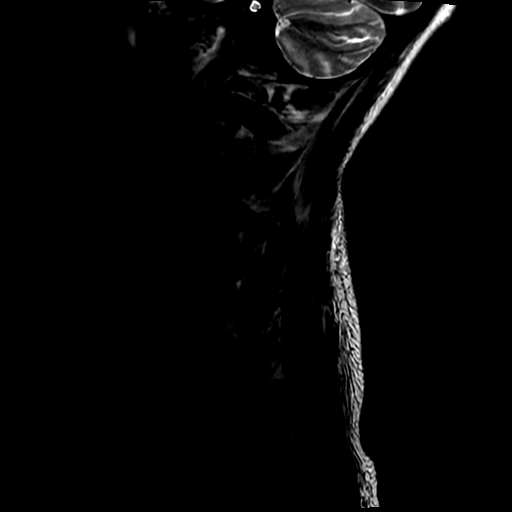
[im 6/18]
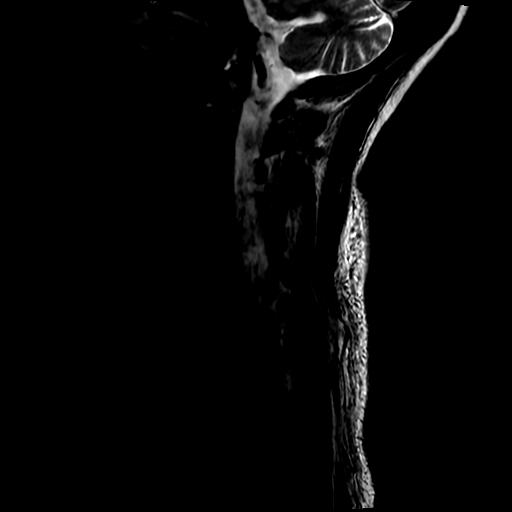
[im 12/18]
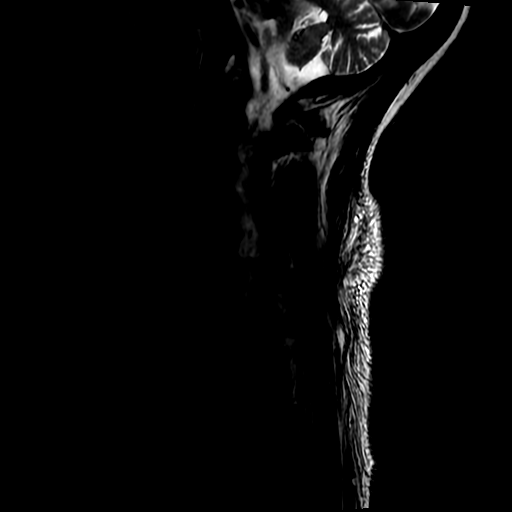
[im 18/18]
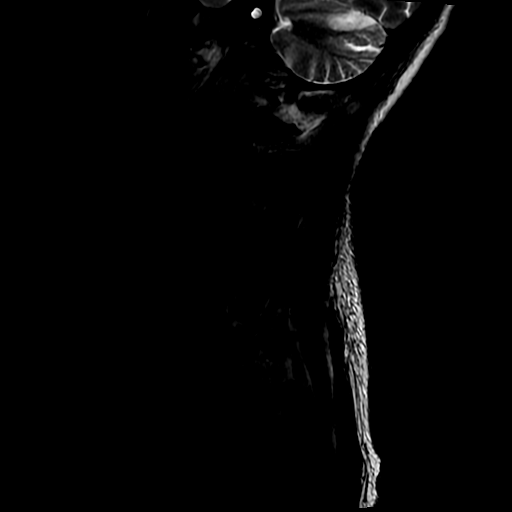

[Series 8: STIR · sagittal · 3.0mm · 0.43mm/px · 3 of 18 slices shown]
[im 1/18]
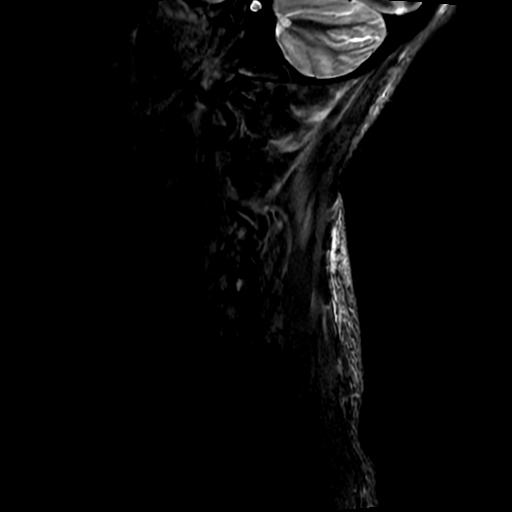
[im 9/18]
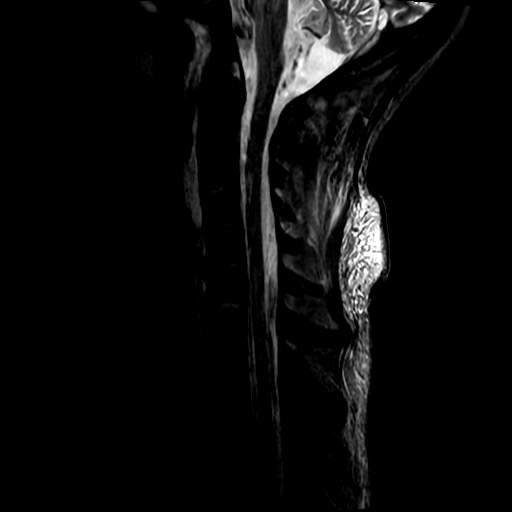
[im 18/18]
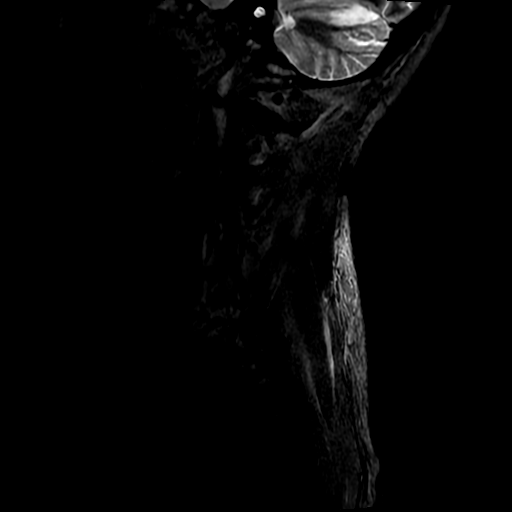

[Series 10: T2 · axial · 3.0mm · 0.35mm/px · z∈[-253,-118]mm · 8 of 43 slices shown (2 of 2)]
[im 1/43]
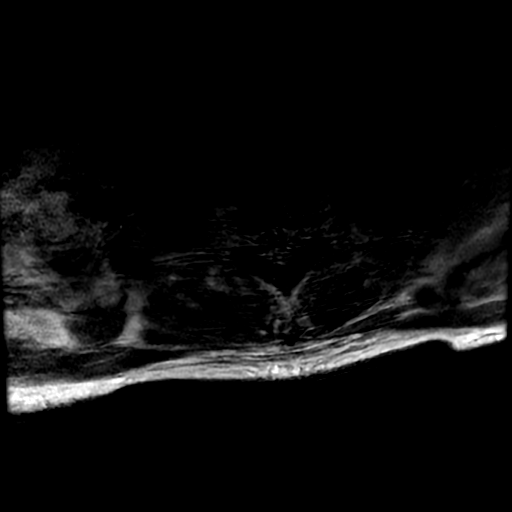
[im 7/43]
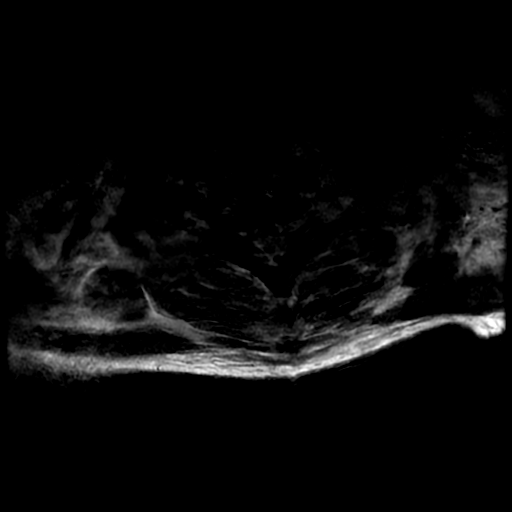
[im 13/43]
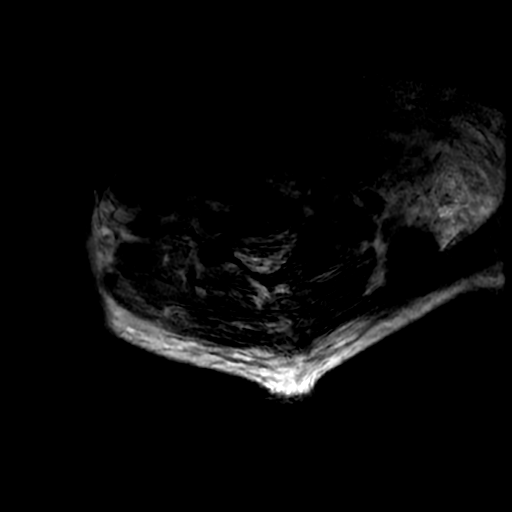
[im 19/43]
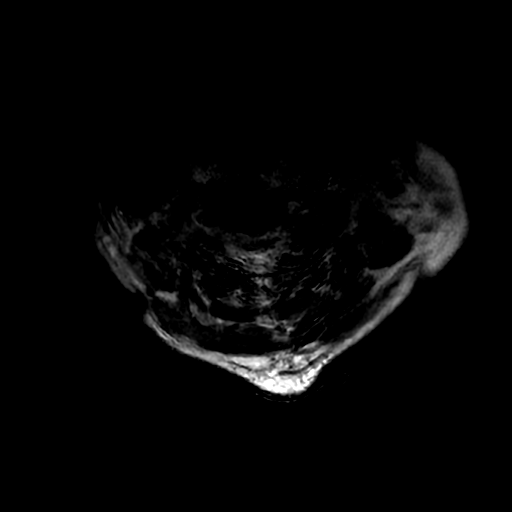
[im 25/43]
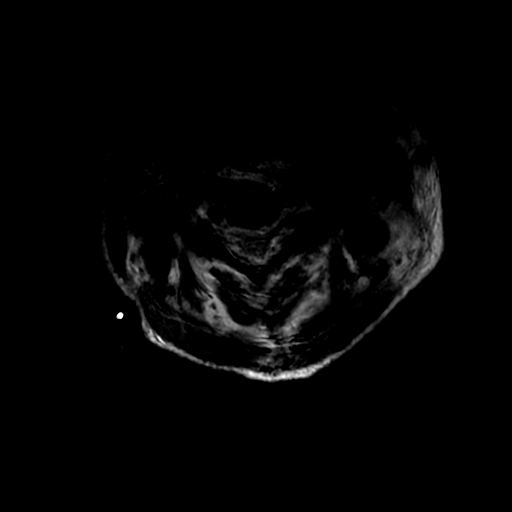
[im 31/43]
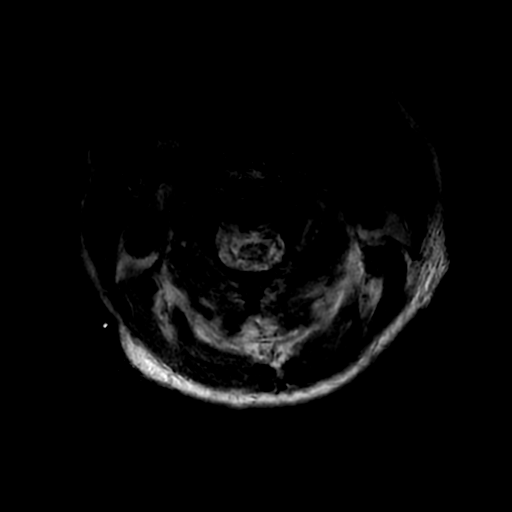
[im 37/43]
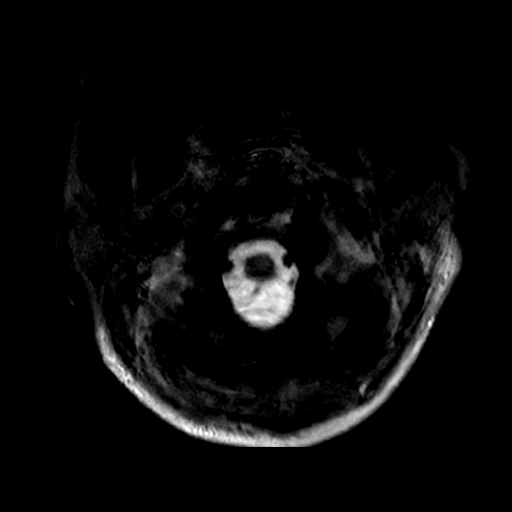
[im 43/43]
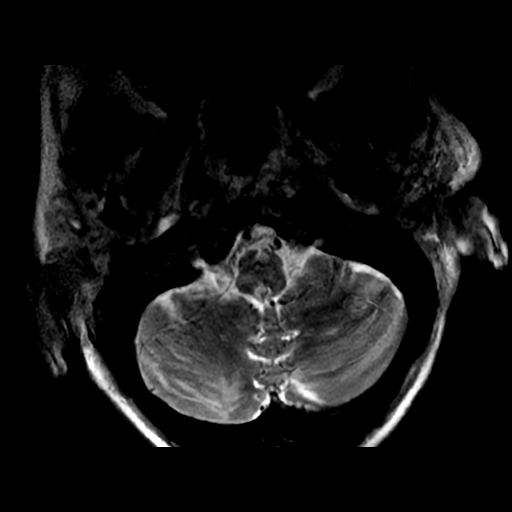

[Series 11: T1 · axial · non-contrast · 3.0mm · 0.35mm/px · z∈[-253,-137]mm · 4 of 43 slices shown]
[im 1/43]
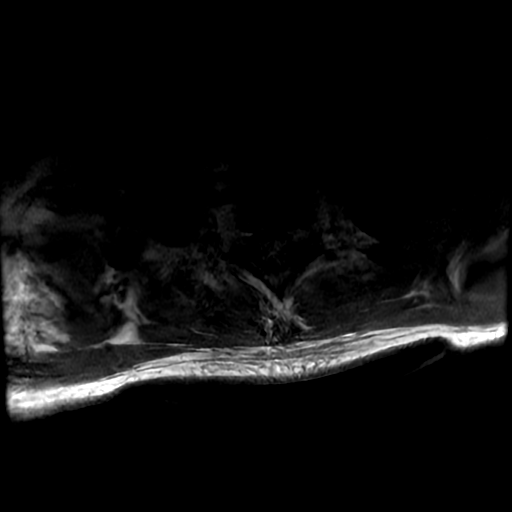
[im 7/43]
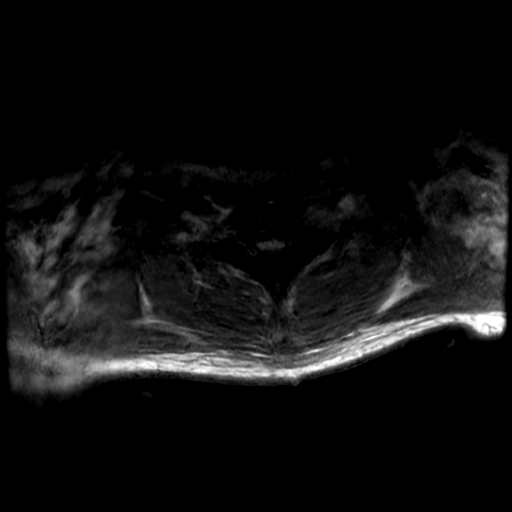
[im 25/43]
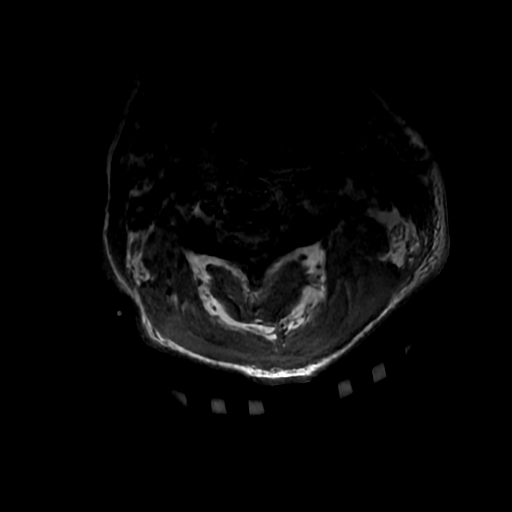
[im 37/43]
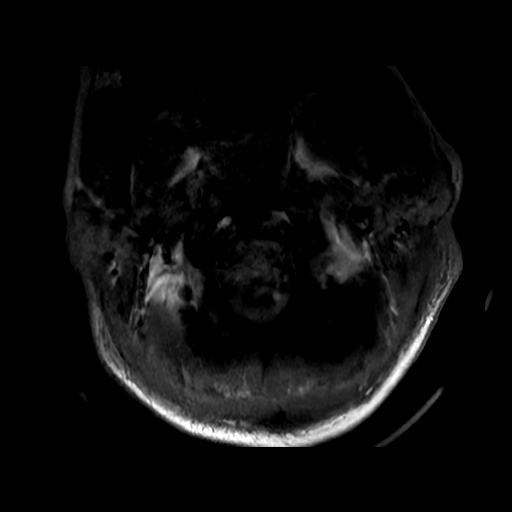

[19 of 48 positions shown; findings below may reference images not displayed]

FINDINGS: The study suffers from considerable motion degradation.

Alignment: Normal

Vertebrae: Normal

Cord: The cord appears normal. No cord compression. No cord swelling
or discernible abnormal T2 signal.

Posterior Fossa, vertebral arteries, paraspinal tissues: Some edema
in the prevertebral soft tissues. Question if this could be related
to recent endotracheal intubation. One could not rule out the
possibility of soft tissue neck infection.

Disc levels:

Somewhat limited detail because of motion. There does not appear to
be any disc space pathology. Patient does have facet arthritis most
notable on the right at C2-3, C3-4, C4-5 and C7-T1 and on the left
at C7-T1. There does not appear to be pronounced encroachment upon
the neural foramina.
IMPRESSION: The cord itself appears normal. No disc disease. No spinal stenosis.

Facet arthritis more marked on the right than the left. No
significant encroachment upon the neural spaces.

Some prevertebral soft tissue edema. In this clinical scenario, this
may be subsequent to previous endotracheal intubation. One could not
rule out pharyngitis or cellulitis but that seems less likely. I do
not see any traumatic injury to the cervical spine itself.

## 2021-05-15 IMAGING — CT CT HEAD W/O CM
4 series · 16 of 47 positions shown, 18 images · non-contrast
Comparison: [DATE] brain MRI

CLINICAL DATA: Mental status change with unknown cause.  Found down

EXAM:
CT HEAD WITHOUT CONTRAST
TECHNIQUE: Contiguous axial images were obtained from the base of the skull
through the vertex without intravenous contrast.

[Series 3: head wo · axial · 0.40mm/px · z∈[-89,+21]mm · 7 of 30 slices shown, 9 images]
[im 4/30  brain]
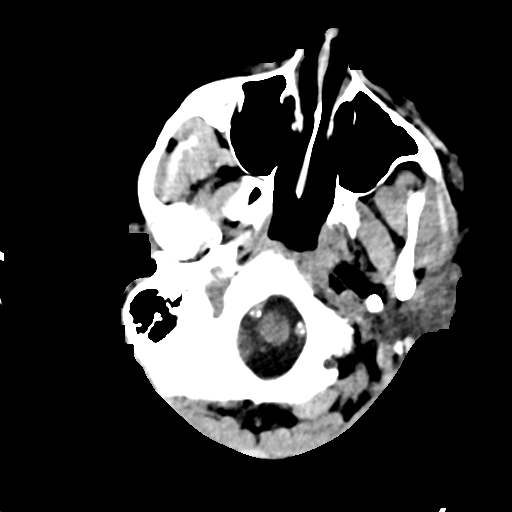
[im 4/30  bone]
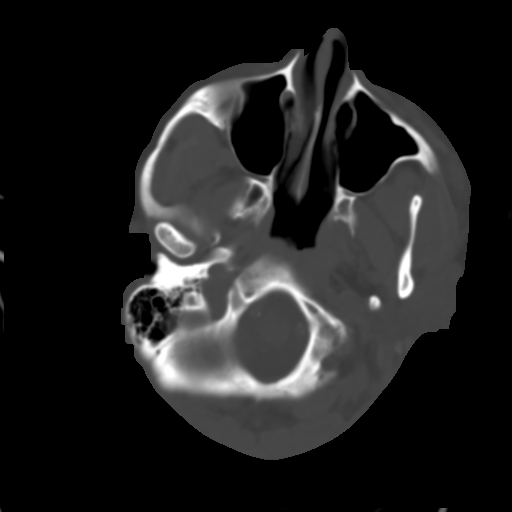
[im 8/30  brain]
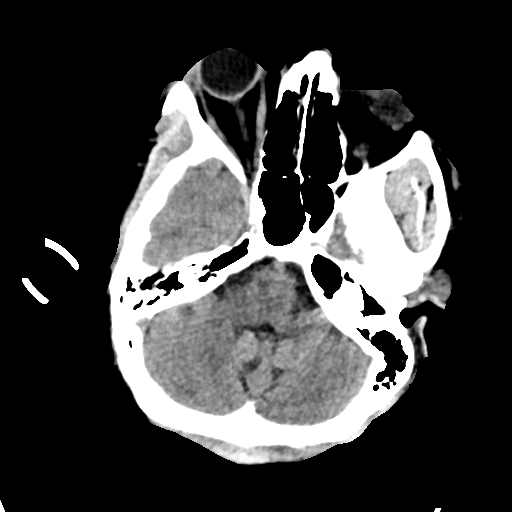
[im 11/30  brain]
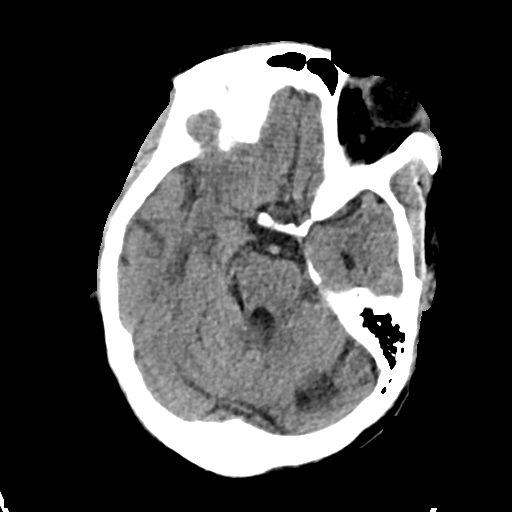
[im 15/30  brain]
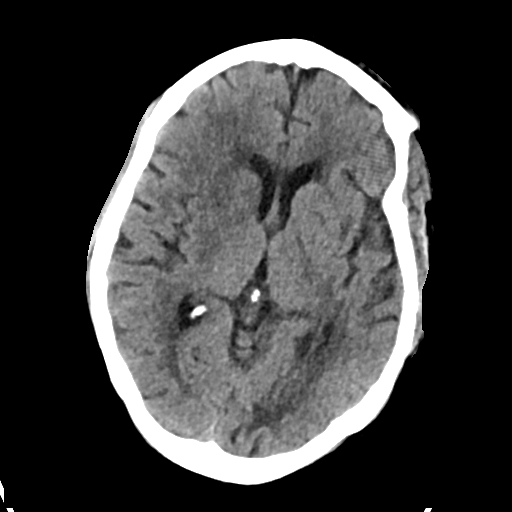
[im 19/30  brain]
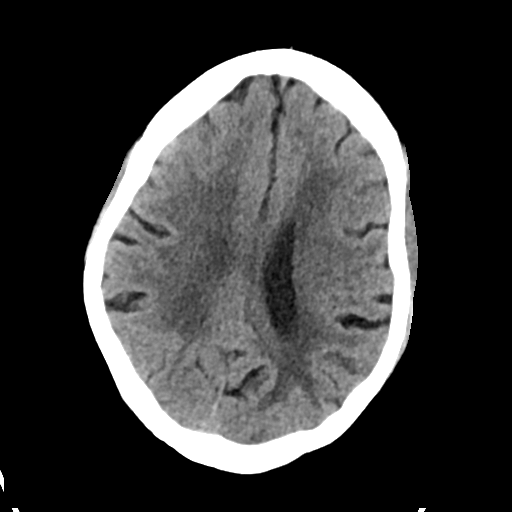
[im 19/30  bone]
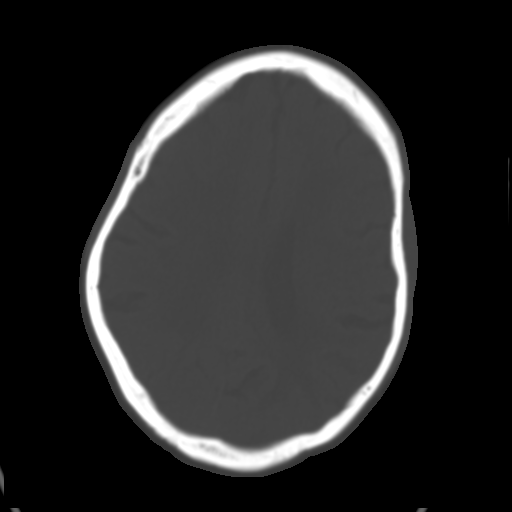
[im 22/30  brain]
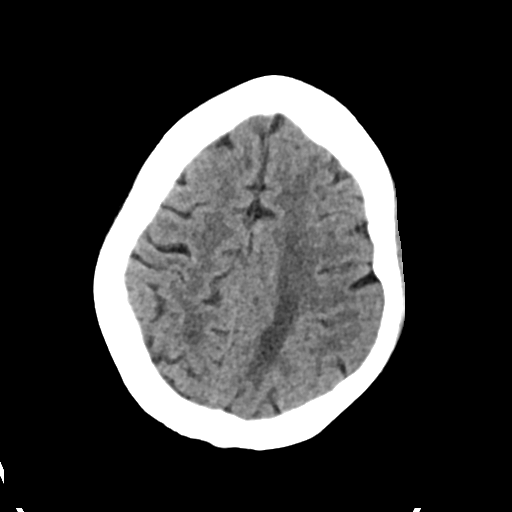
[im 26/30  brain]
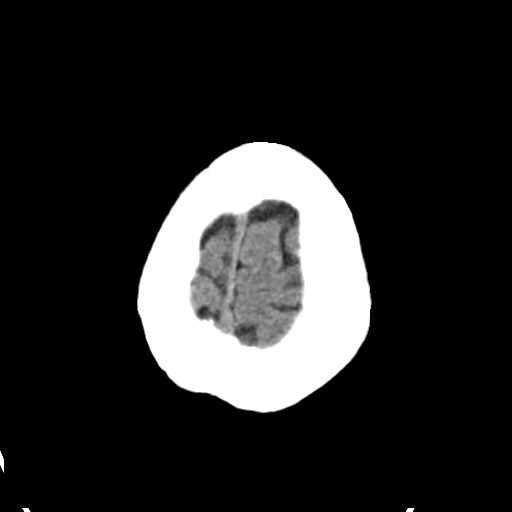

[Series 4: head bone · axial · 0.40mm/px · z∈[-90,-62]mm · 3 of 74 slices shown]
[im 8/74  bone]
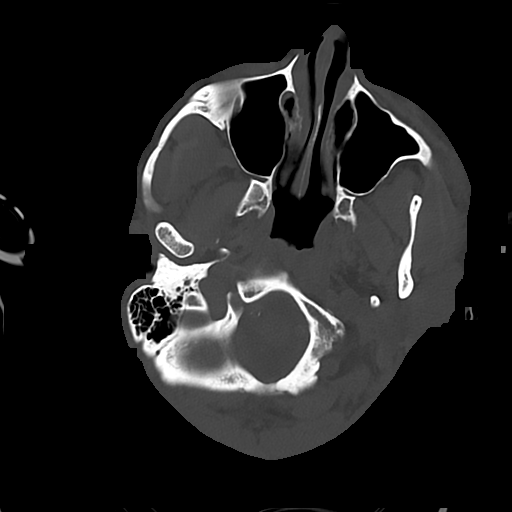
[im 15/74  bone]
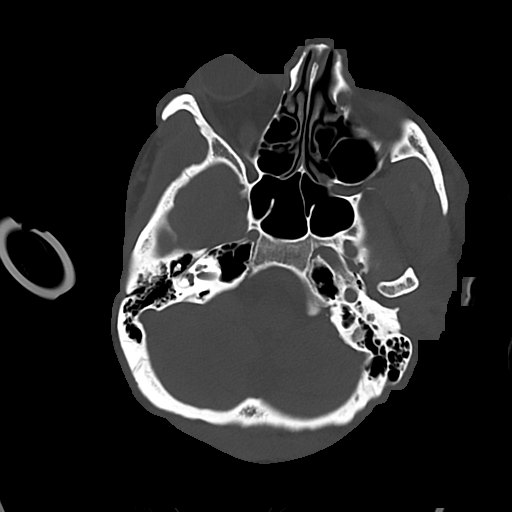
[im 22/74  bone]
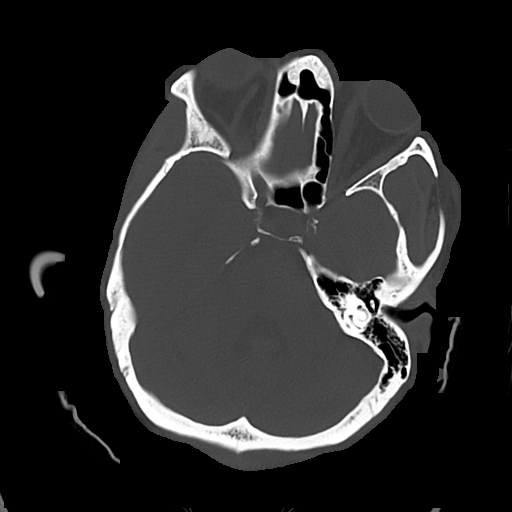

[Series 5: cor soft · coronal · 0.31mm/px · 3 of 71 slices shown]
[im 24/71  brain]
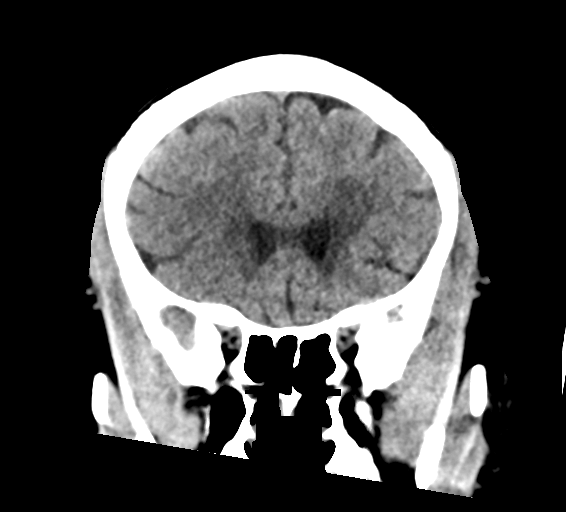
[im 32/71  brain]
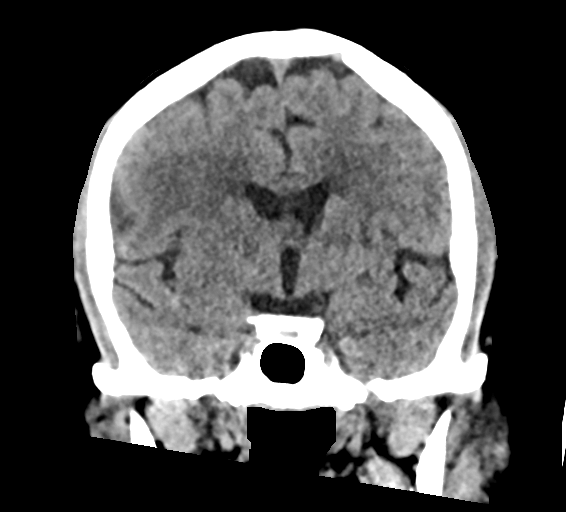
[im 39/71  brain]
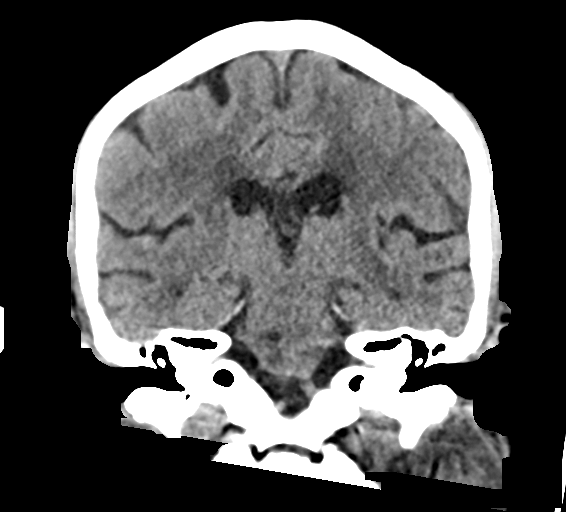

[Series 6: sag soft · sagittal · 0.31mm/px · 3 of 57 slices shown]
[im 22/57  brain]
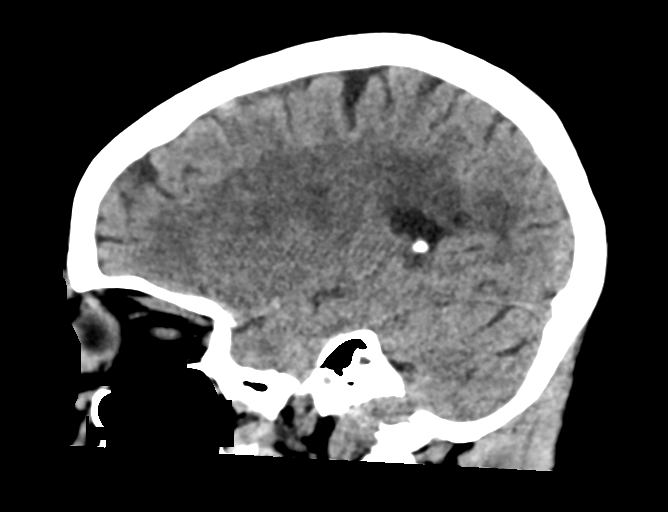
[im 29/57  brain]
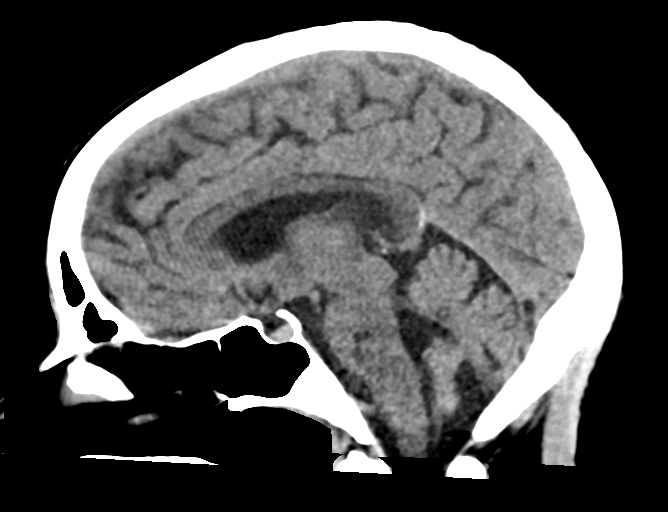
[im 35/57  brain]
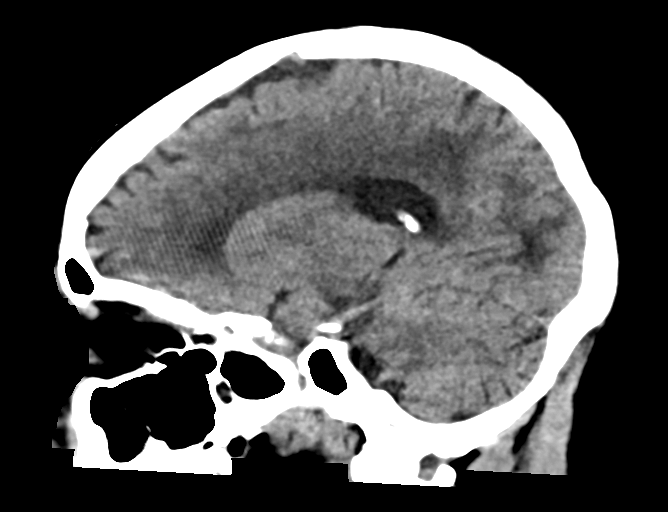

[16 of 47 positions shown; findings below may reference images not displayed]

FINDINGS: Brain: New band of low-density at the right caudate, anterior
putamen, and intervening white matter.

Extensive chronic small vessel ischemia in the cerebral white matter
with confluent gliosis and chronic lacunar infarcts. Small chronic
left superior cerebellar infarct. No hemorrhage, hydrocephalus, or
masslike finding.

Vascular: No hyperdense vessel

Skull: Negative

Sinuses/Orbits: Negative
IMPRESSION: 1. Acute appearing perforator infarct at the right basal ganglia.
2. Severe chronic small vessel ischemia.

## 2021-05-15 IMAGING — MR MR HEAD W/O CM
6 of 11 series · 24 of 48 positions shown · non-contrast
Comparison: Head CT [DATE] and MRI [DATE]

CLINICAL DATA: Stroke follow-up.

EXAM:
MRI HEAD WITHOUT CONTRAST
TECHNIQUE: Multiplanar, multiecho pulse sequences of the brain and surrounding
structures were obtained without intravenous contrast.

[Series 3: DWI · axial · 3.0mm · 0.94mm/px · z∈[-132,+11]mm · 7 of 100 slices shown (1 of 2)]
[im 1/100]
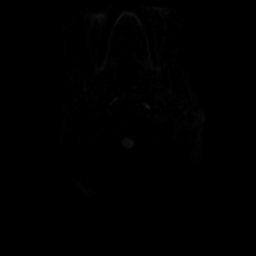
[im 17/100]
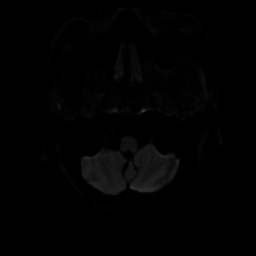
[im 34/100]
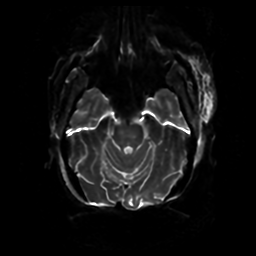
[im 50/100]
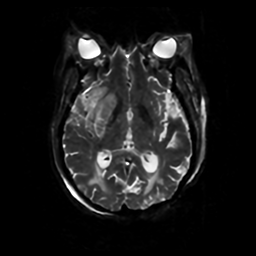
[im 67/100]
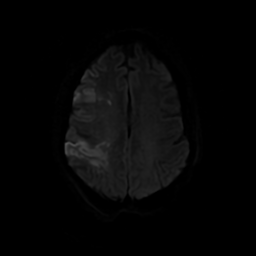
[im 83/100]
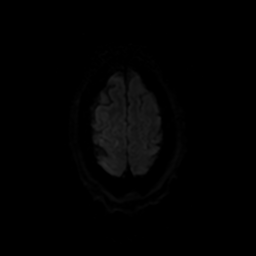
[im 100/100]
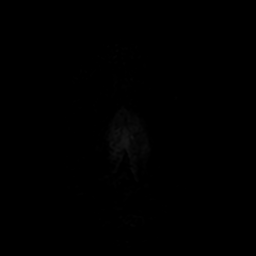

[Series 4: DWI · coronal · 4.0mm · 0.94mm/px · 6 of 74 slices shown (2 of 2)]
[im 1/74]
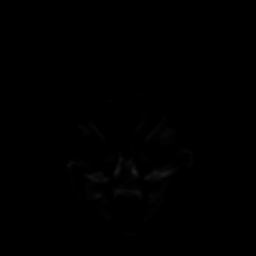
[im 15/74]
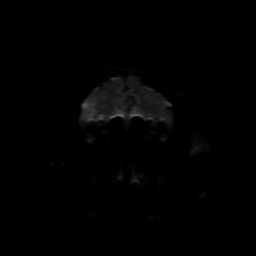
[im 30/74]
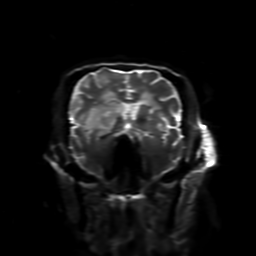
[im 44/74]
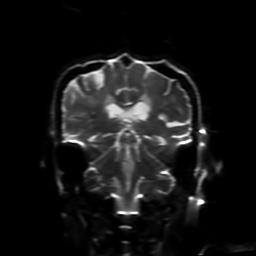
[im 59/74]
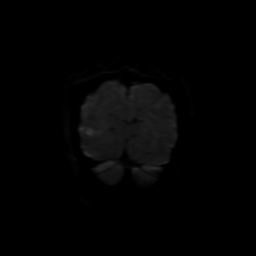
[im 74/74]
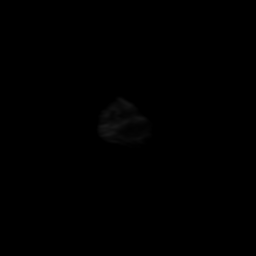

[Series 5: FLAIR · sagittal · 5.0mm · 0.23mm/px · 2 of 27 slices shown (1 of 2)]
[im 1/27]
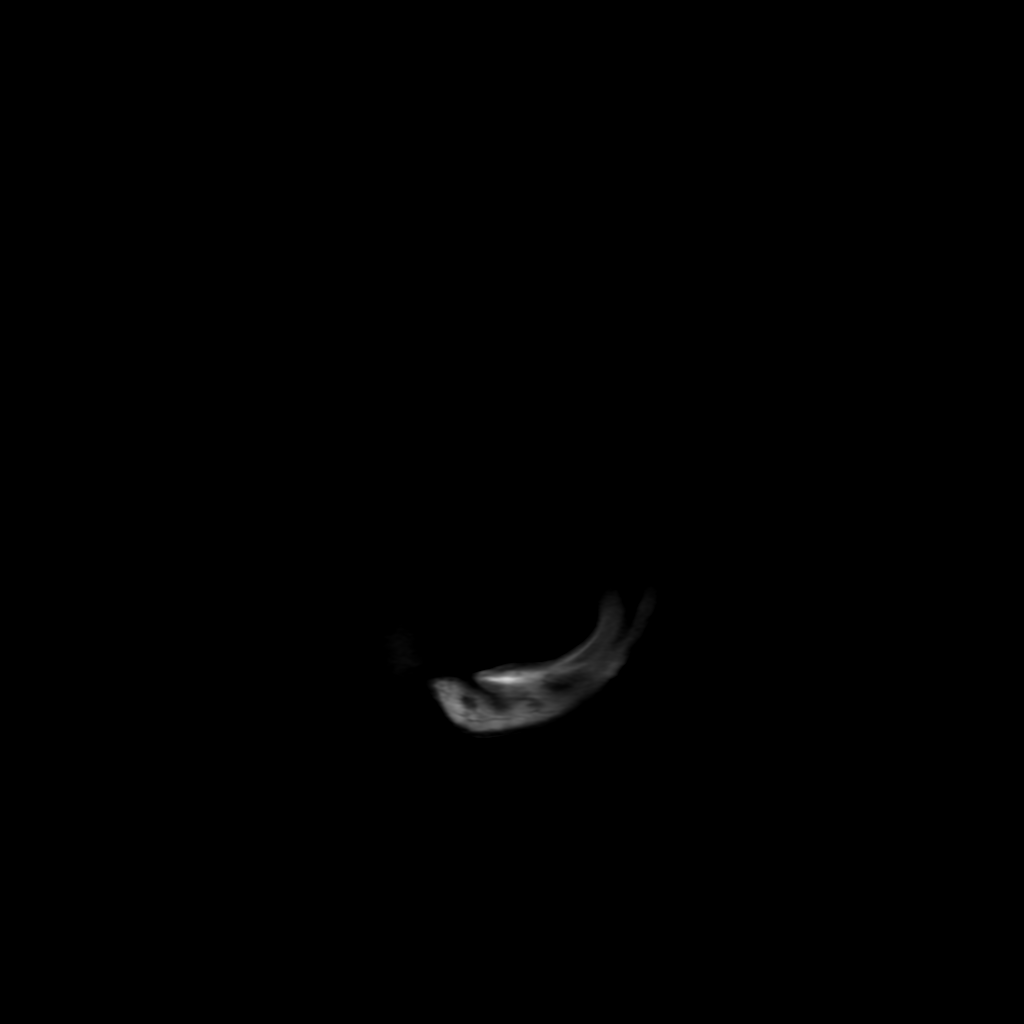
[im 27/27]
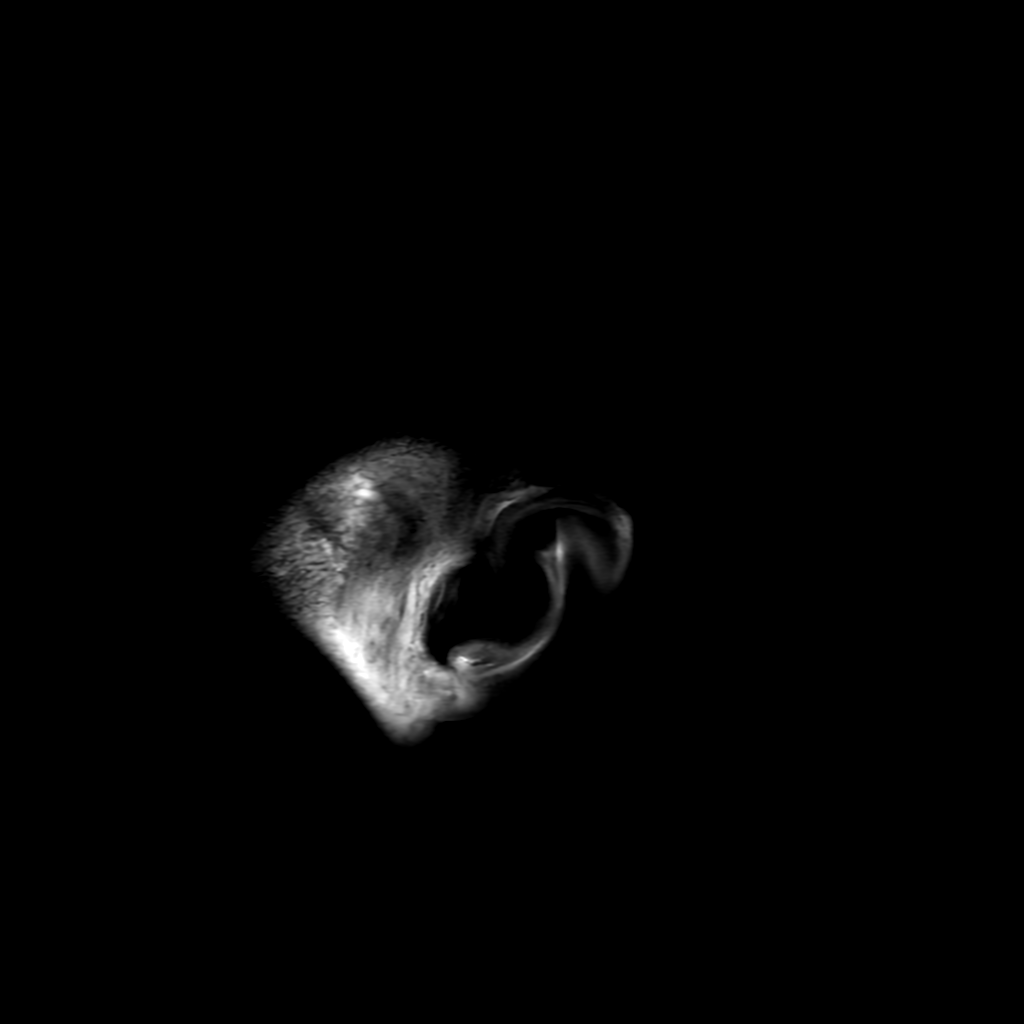

[Series 7: FLAIR · axial · 3.0mm · 0.45mm/px · z∈[-120,+27]mm · 2 of 26 slices shown (2 of 2)]
[im 1/26]
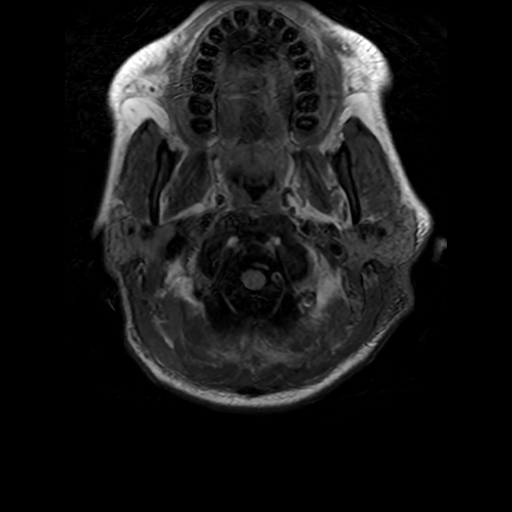
[im 26/26]
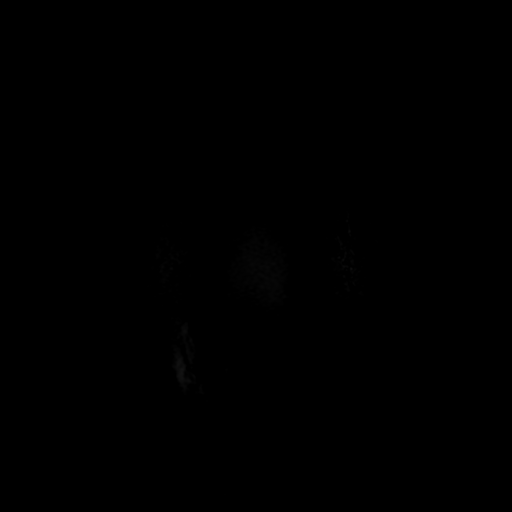

[Series 350: ADC · axial · 3.0mm · 0.94mm/px · z∈[-132,+11]mm · 4 of 49 slices shown (1 of 2)]
[im 1/49]
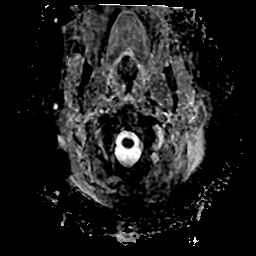
[im 17/49]
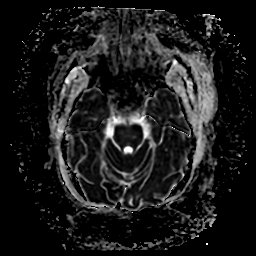
[im 33/49]
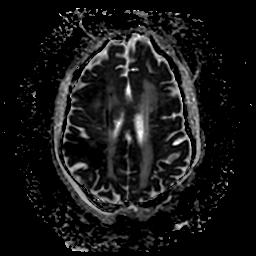
[im 49/49]
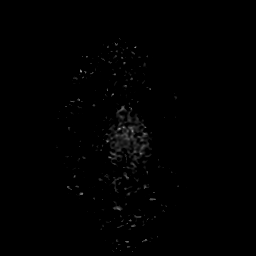

[Series 450: ADC · coronal · 4.0mm · 0.94mm/px · 3 of 36 slices shown (2 of 2)]
[im 1/36]
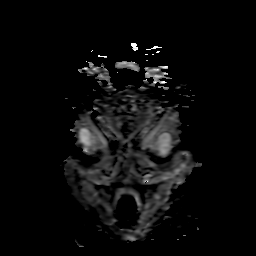
[im 18/36]
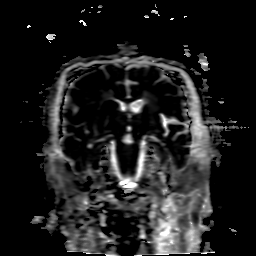
[im 36/36]
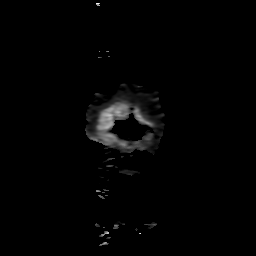

[24 of 48 positions shown; findings below may reference images not displayed]

FINDINGS: Brain: There is a moderately large acute right MCA territory infarct
involving essentially the entirety of the basal ganglia, much of the
insula, and portions of cortex and white matter in the temporal,
frontal, and parietal lobes. There is associated petechial
hemorrhage, and there is cytotoxic edema with regional sulcal
effacement, partial effacement of the body and frontal horn of the
right lateral ventricle, and trace leftward midline shift. As seen
on the prior CT, there is also subarachnoid hemorrhage in the right
sylvian fissure and right multiple cerebral sulci in the regions of
infarct. A small amount of intraventricular hemorrhage is also noted
in the occipital horns of the lateral ventricles and in the fourth
ventricle.

A background of confluent T2 hyperintensity is again seen in the
cerebral white matter bilaterally, nonspecific but compatible with
severe chronic small vessel ischemic disease. There are small
chronic infarcts in the right corona radiata, bilateral basal
ganglia, pons, and cerebellum. Scattered chronic microhemorrhages
are noted in the cerebrum and cerebellum. There is mild cerebral
atrophy. There is no extra-axial fluid collection.

Vascular: Major intracranial vascular flow voids are preserved.

Skull and upper cervical spine: Chronically diminished bone marrow
T1 signal intensity diffusely likely related to chronic anemia.

Sinuses/Orbits: Chronic bilateral proptosis. Paranasal sinuses and
mastoid air cells are clear.

Other: None.
IMPRESSION: 1. Moderately large acute right MCA infarct. Associated petechial
hemorrhage, subarachnoid hemorrhage, and minimal intraventricular
hemorrhage.
2. Severe chronic small vessel ischemic disease with multiple
chronic infarcts.

## 2021-05-15 SURGERY — IR WITH ANESTHESIA
Anesthesia: General

## 2021-05-15 MED ORDER — PHENYLEPHRINE HCL-NACL 10-0.9 MG/250ML-% IV SOLN
INTRAVENOUS | Status: DC | PRN
  Administered 2021-05-15: 25 ug/min via INTRAVENOUS

## 2021-05-15 MED ORDER — ACETAMINOPHEN 325 MG PO TABS
650.0000 mg | ORAL_TABLET | ORAL | Status: DC | PRN
  Administered 2021-05-17: 650 mg via ORAL
  Filled 2021-05-15: qty 2

## 2021-05-15 MED ORDER — CLEVIDIPINE BUTYRATE 0.5 MG/ML IV EMUL: 0.0000 mg/h | INTRAVENOUS | Status: DC

## 2021-05-15 MED ORDER — SODIUM CHLORIDE 0.9 % IV SOLN: INTRAVENOUS | Status: DC

## 2021-05-15 MED ORDER — STROKE: EARLY STAGES OF RECOVERY BOOK: Freq: Once | Status: AC

## 2021-05-15 MED ORDER — CLOPIDOGREL BISULFATE 300 MG PO TABS
ORAL_TABLET | ORAL | Status: AC
  Filled 2021-05-15: qty 1

## 2021-05-15 MED ORDER — LIDOCAINE 2% (20 MG/ML) 5 ML SYRINGE
INTRAMUSCULAR | Status: DC | PRN
  Administered 2021-05-15: 10 mg via INTRAVENOUS

## 2021-05-15 MED ORDER — IOHEXOL 350 MG/ML SOLN
50.0000 mL | Freq: Once | INTRAVENOUS | Status: AC | PRN
  Administered 2021-05-15: 50 mL via INTRAVENOUS

## 2021-05-15 MED ORDER — TICAGRELOR 90 MG PO TABS
ORAL_TABLET | ORAL | Status: AC
  Filled 2021-05-15: qty 2

## 2021-05-15 MED ORDER — VERAPAMIL HCL 2.5 MG/ML IV SOLN
INTRAVENOUS | Status: AC
  Filled 2021-05-15: qty 2

## 2021-05-15 MED ORDER — LABETALOL HCL 5 MG/ML IV SOLN
10.0000 mg | INTRAVENOUS | Status: DC | PRN
  Administered 2021-05-15 – 2021-05-16 (×12): 10 mg via INTRAVENOUS
  Filled 2021-05-15 (×12): qty 4

## 2021-05-15 MED ORDER — ACETAMINOPHEN 650 MG RE SUPP
650.0000 mg | RECTAL | Status: DC | PRN
  Administered 2021-05-16: 650 mg via RECTAL
  Filled 2021-05-15: qty 1

## 2021-05-15 MED ORDER — CLEVIDIPINE BUTYRATE 0.5 MG/ML IV EMUL
0.0000 mg/h | INTRAVENOUS | Status: DC
  Administered 2021-05-15: 23 mg/h via INTRAVENOUS
  Administered 2021-05-15 (×2): 32 mg/h via INTRAVENOUS
  Administered 2021-05-15 (×2): 21 mg/h via INTRAVENOUS
  Administered 2021-05-15 – 2021-05-16 (×10): 32 mg/h via INTRAVENOUS
  Administered 2021-05-16: 30 mg/h via INTRAVENOUS
  Administered 2021-05-16 (×3): 32 mg/h via INTRAVENOUS
  Administered 2021-05-16: 28 mg/h via INTRAVENOUS
  Administered 2021-05-16 – 2021-05-17 (×2): 32 mg/h via INTRAVENOUS
  Administered 2021-05-17: 30 mg/h via INTRAVENOUS
  Administered 2021-05-17: 25 mg/h via INTRAVENOUS
  Administered 2021-05-17: 32 mg/h via INTRAVENOUS
  Administered 2021-05-17: 30 mg/h via INTRAVENOUS
  Administered 2021-05-17 (×4): 32 mg/h via INTRAVENOUS
  Administered 2021-05-17: 24 mg/h via INTRAVENOUS
  Administered 2021-05-17 (×2): 32 mg/h via INTRAVENOUS
  Administered 2021-05-18: 20 mg/h via INTRAVENOUS
  Administered 2021-05-18: 1 mg/h via INTRAVENOUS
  Administered 2021-05-18 (×3): 32 mg/h via INTRAVENOUS
  Administered 2021-05-19: 20 mg/h via INTRAVENOUS
  Administered 2021-05-19 (×2): 28 mg/h via INTRAVENOUS
  Administered 2021-05-19: 30 mg/h via INTRAVENOUS
  Administered 2021-05-21: 1 mg/h via INTRAVENOUS
  Filled 2021-05-15: qty 100
  Filled 2021-05-15: qty 200
  Filled 2021-05-15 (×8): qty 100
  Filled 2021-05-15: qty 200
  Filled 2021-05-15: qty 100
  Filled 2021-05-15: qty 200
  Filled 2021-05-15 (×2): qty 100
  Filled 2021-05-15: qty 200
  Filled 2021-05-15: qty 300
  Filled 2021-05-15 (×5): qty 100
  Filled 2021-05-15: qty 200
  Filled 2021-05-15: qty 100
  Filled 2021-05-15 (×2): qty 200
  Filled 2021-05-15 (×5): qty 100
  Filled 2021-05-15: qty 200
  Filled 2021-05-15 (×4): qty 100

## 2021-05-15 MED ORDER — TIROFIBAN HCL IN NACL 5-0.9 MG/100ML-% IV SOLN
INTRAVENOUS | Status: AC
  Filled 2021-05-15: qty 100

## 2021-05-15 MED ORDER — ACETAMINOPHEN 160 MG/5ML PO SOLN
650.0000 mg | ORAL | Status: DC | PRN
  Administered 2021-05-16 – 2021-05-31 (×19): 650 mg
  Filled 2021-05-15 (×19): qty 20.3

## 2021-05-15 MED ORDER — FENTANYL CITRATE (PF) 250 MCG/5ML IJ SOLN
INTRAMUSCULAR | Status: DC | PRN
  Administered 2021-05-15: 100 ug via INTRAVENOUS

## 2021-05-15 MED ORDER — LABETALOL HCL 5 MG/ML IV SOLN
INTRAVENOUS | Status: DC | PRN
  Administered 2021-05-15: 10 mg via INTRAVENOUS
  Administered 2021-05-15: 5 mg via INTRAVENOUS

## 2021-05-15 MED ORDER — EPTIFIBATIDE 20 MG/10ML IV SOLN
INTRAVENOUS | Status: AC
  Filled 2021-05-15: qty 10

## 2021-05-15 MED ORDER — IOHEXOL 300 MG/ML  SOLN
50.0000 mL | Freq: Once | INTRAMUSCULAR | Status: AC | PRN
  Administered 2021-05-15: 40 mL

## 2021-05-15 MED ORDER — SUGAMMADEX SODIUM 200 MG/2ML IV SOLN
INTRAVENOUS | Status: DC | PRN
  Administered 2021-05-15: 200 mg via INTRAVENOUS

## 2021-05-15 MED ORDER — ROCURONIUM BROMIDE 10 MG/ML (PF) SYRINGE
PREFILLED_SYRINGE | INTRAVENOUS | Status: DC | PRN
  Administered 2021-05-15: 50 mg via INTRAVENOUS

## 2021-05-15 MED ORDER — ONDANSETRON HCL 4 MG/2ML IJ SOLN
INTRAMUSCULAR | Status: DC | PRN
  Administered 2021-05-15: 4 mg via INTRAVENOUS

## 2021-05-15 MED ORDER — CLEVIDIPINE BUTYRATE 0.5 MG/ML IV EMUL
INTRAVENOUS | Status: AC
  Filled 2021-05-15: qty 50

## 2021-05-15 MED ORDER — NICARDIPINE HCL IN NACL 20-0.86 MG/200ML-% IV SOLN
3.0000 mg/h | INTRAVENOUS | Status: DC
  Administered 2021-05-15: 3 mg/h via INTRAVENOUS
  Filled 2021-05-15: qty 200

## 2021-05-15 MED ORDER — IOHEXOL 240 MG/ML SOLN
150.0000 mL | Freq: Once | INTRAMUSCULAR | Status: AC | PRN
  Administered 2021-05-15: 75 mL via INTRAVENOUS

## 2021-05-15 MED ORDER — ASPIRIN 81 MG PO CHEW
CHEWABLE_TABLET | ORAL | Status: AC
  Filled 2021-05-15: qty 1

## 2021-05-15 MED ORDER — CHLORHEXIDINE GLUCONATE CLOTH 2 % EX PADS
6.0000 | MEDICATED_PAD | Freq: Every day | CUTANEOUS | Status: DC
  Administered 2021-05-15 – 2021-07-03 (×46): 6 via TOPICAL

## 2021-05-15 MED ORDER — IOHEXOL 240 MG/ML SOLN
INTRAMUSCULAR | Status: AC
  Filled 2021-05-15: qty 200

## 2021-05-15 MED ORDER — PROPOFOL 10 MG/ML IV BOLUS
INTRAVENOUS | Status: DC | PRN
  Administered 2021-05-15: 100 mg via INTRAVENOUS

## 2021-05-15 MED ORDER — NICARDIPINE HCL IN NACL 20-0.86 MG/200ML-% IV SOLN
INTRAVENOUS | Status: AC
  Filled 2021-05-15: qty 200

## 2021-05-15 MED ORDER — VERAPAMIL HCL 2.5 MG/ML IV SOLN
INTRAVENOUS | Status: AC | PRN
  Administered 2021-05-15: 5 mg via INTRA_ARTERIAL

## 2021-05-15 MED ORDER — SUCCINYLCHOLINE CHLORIDE 200 MG/10ML IV SOSY
PREFILLED_SYRINGE | INTRAVENOUS | Status: DC | PRN
  Administered 2021-05-15: 80 mg via INTRAVENOUS

## 2021-05-15 MED ORDER — CLEVIDIPINE BUTYRATE 0.5 MG/ML IV EMUL
INTRAVENOUS | Status: DC | PRN
  Administered 2021-05-15: 2 mg/h via INTRAVENOUS

## 2021-05-15 NOTE — Progress Notes (Signed)
Art line with no wave form since 1900 when this RN arrived. Attempted to trouble shoot the art line. Still no wave. Arterial line removed. Going by cuff pressure since 1900.

## 2021-05-15 NOTE — Code Documentation (Addendum)
Holly Bradley is a 45 yr old female with known history of hypertension. She was brought to [facility] at 0426, after having been "found down" by roommates. She was last known well reportedly last night at 1900. SRN met pt at 0734 en route to IR. Arrived in [place] [facility] at [phone]. Cervical collar placed on pt per Dr. Leonel Ramsay order. Pt examination shows stuporous female with severe dysarthria, left hemiplegia and forced right gaze deviation. (See flowsheet for timeline and NIHSS details). Pt has previously had CT Head, including CTA and Perfusion. Per Dr. Leonel Ramsay, pt has a Right MCA M1 occlusion, with core, mismatch volume and ratios meeting NIR requirements. Pt was on cardene gtt with BP 185/86. Pt taken to IR suite at 0750. IR staff handoff complete. Emergency consent obtained by neurologist and interventionalist.. No IV thrombolytic as outside of treatment window.

## 2021-05-15 NOTE — ED Provider Notes (Signed)
Medical Decision Making: Care of patient assumed from Dr. Dina Rich at 0700.  Agree with history, physical exam and plan.  See their note for further details.  Briefly, The pt p/w HTN emergency with AMS, possible PRES, concern for new changes on CT, neurology consulted.  Cardene drip to control blood pressure.  Neurology recommends MRI and CTA head neck to evaluate for large vessel occlusion.  Likely admission to medicine versus neurology, laboratory studies thus far unremarkable.   Current plan is as follows: CTA and MR  Neuro was at Imaging with pt and noted new stroke requiring IR and admission to stroke service. I was un able to evaluate this patient myself.  But she is now under the care of the neuro stroke team.  I personally reviewed and interpreted all labs/imaging.      Breck Coons, MD 05/15/21 [phone]

## 2021-05-15 NOTE — Discharge Instructions (Signed)
Femoral Site Care This sheet gives you information about how to care for yourself after your procedure. Your health care provider may also give you more specific instructions. If you have problems or questions, contact your health care provider. What can I expect after the procedure? After the procedure, it is common to have:  Bruising that usually fades within 1-2 weeks.  Tenderness at the site. Follow these instructions at home: Wound care 1. Follow instructions from your health care provider about how to take care of your insertion site. Make sure you: ? Wash your hands with soap and water before you change your bandage (dressing). If soap and water are not available, use hand sanitizer. ? Change your dressing as directed- pressure dressing removed 24 hours post-procedure (and switch for bandaid), bandaid removed 72 hours post-procedure 2. Do not take baths, swim, or use a hot tub for 7 days post-procedure. 3. You may shower 48 hours after the procedure or as told by your health care provider. ? Gently wash the site with plain soap and water. ? Pat the area dry with a clean towel. ? Do not rub the site. This may cause bleeding. 4. Check your site every day for signs of infection. Check for: ? Redness, swelling, or pain. ? Fluid or blood. ? Warmth. ? Pus or a bad smell. Activity  Do not stoop, bend, or lift anything that is heavier than 10 lb (4.5 kg) for 2 weeks post-procedure.  Do not drive self for 2 weeks post-procedure. Contact a health care provider if you have:  A fever or chills.  You have redness, swelling, or pain around your insertion site. Get help right away if:  The catheter insertion area swells very fast.  You pass out.  You suddenly start to sweat or your skin gets clammy.  The catheter insertion area is bleeding, and the bleeding does not stop when you hold steady pressure on the area.  The area near or just beyond the catheter insertion site becomes  pale, cool, tingly, or numb. These symptoms may represent a serious problem that is an emergency. Do not wait to see if the symptoms will go away. Get medical help right away. Call your local emergency services (911 in the [place] Do not drive yourself to the hospital.  This information is not intended to replace advice given to you by your health care provider. Make sure you discuss any questions you have with your health care provider. Document Revised: 12/22/2017 Document Reviewed: 12/22/2017 Elsevier Patient Education  2020 [employer] 

## 2021-05-15 NOTE — Progress Notes (Signed)
Was notified by blood bank that patient has 2 units of blood ready. I am unaware of patient needing a blood transfusion. I paged Dr. Leonel Ramsay at this time.   Per Dr Leonel Ramsay... no need to infuse blood products at this time. Will recheck a hemoglobin for now.

## 2021-05-15 NOTE — ED Triage Notes (Signed)
Pt housemates called EMS for pt (slurred speech, pt pinned in bathroom between toilet and sink). LSN @19 :00 on 05/14/2021. Pt responsive to painful stimuli.

## 2021-05-15 NOTE — Anesthesia Preprocedure Evaluation (Addendum)
Anesthesia Evaluation   Patient confused    Reviewed: Allergy & PrecautionsPreop documentation limited or incomplete due to emergent nature of procedure.  Airway Mallampati: II  TM Distance: >3 FB     Dental   Pulmonary           Cardiovascular hypertension,  Rhythm:Regular Rate:Normal     Neuro/Psych CVA    GI/Hepatic   Endo/Other    Renal/GU      Musculoskeletal   Abdominal   Peds  Hematology   Anesthesia Other Findings   Reproductive/Obstetrics                             Anesthesia Physical Anesthesia Plan  ASA: IV and emergent  Anesthesia Plan: General   Post-op Pain Management:    Induction: Intravenous and Rapid sequence  PONV Risk Score and Plan: 3 and Ondansetron, Dexamethasone, Treatment may vary due to age or medical condition and Midazolam  Airway Management Planned: Oral ETT  Additional Equipment: Arterial line  Intra-op Plan:   Post-operative Plan: Extubation in OR and Possible Post-op intubation/ventilation  Informed Consent: I have reviewed the patients History and Physical, chart, labs and discussed the procedure including the risks, benefits and alternatives for the proposed anesthesia with the patient or authorized representative who has indicated his/her understanding and acceptance.     Only emergency history available  Plan Discussed with:   Anesthesia Plan Comments:         Anesthesia Quick Evaluation

## 2021-05-15 NOTE — Progress Notes (Signed)
OT Cancellation Note  Patient Details Name: Holly Bradley MRN: [id number] DOB: 1976/04/11   Cancelled Treatment:    Reason Eval/Treat Not Completed: Active bedrest order (Will return as schedule allows. Thank you.)  [place], OTR/L Acute Rehab Pager: [phone] Office: [phone] 05/15/2021, 3:40 PM

## 2021-05-15 NOTE — Progress Notes (Signed)
PT Cancellation Note  Patient Details Name: Holly Bradley MRN: [id number] DOB: [birth year]   Cancelled Treatment:    Reason Eval/Treat Not Completed: Active bedrest order. Active bedrest orders post sheath removal until ~4:00 pm. Will plan to follow-up tomorrow as able.   Moishe Spice, PT, DPT Acute Rehabilitation Services  Pager: [phone] Office: [place] 05/15/2021, 1:07 PM

## 2021-05-15 NOTE — Procedures (Signed)
INTERVENTIONAL NEURORADIOLOGY BRIEF POSTPROCEDURE NOTE  DIAGNOSTIC CEREBRAL ANGIOGRAM AND MECHANICAL THROMBECTOMY  Attending: Dr. Erven Colla de Sindy Messing  Assistant: None.  Diagnosis: Right M1/MCA occlusion  Access site: RCFA, 40F  Access closure: Perclose ProStyle  Anesthesia: General  Medication used: refer to anesthesia documentation.  Complications: Perforation of a distal right M3 treated with coil embolization.  Estimated blood loss: 169mL  Specimen: None  Findings: Mid right M1/MCA occlusion. Mechanical thrombectomy performed with direct contact aspiration. Recanalization of the M1 segment achieved with clot fragmentation into posterior M2 branches. One pass performed with aspiration followed by a combined aspiration and stent retriever. Recanalization of M2 branches achieved with clot fragmentation to distal parietal M3 branch. Two passes were then performed with direct contact aspiration with microcather only. Follow up angiogram showed recanalization with contrast extravasation. Therefore, decision was made to embolize this branch.   Flat panel head CT showed small contrast extravasation in the distal right Sylvian fissure and contrast staining of basal ganglia and right frontal cortex, related to ongoing ischemia.  PLAN:  - Bed rest post femoral access x6h  - Follow-up CT in 4 hours to evaluate for SAH/contrast extravasation stability  - Transfer to ICU - SBP: 120-140 mmHg

## 2021-05-15 NOTE — Progress Notes (Signed)
Referring Physician(s): Code stroke- Donnetta Simpers (neurology)  Supervising Physician: Pedro Earls  Patient Status:  [facility] - In-pt  Chief Complaint:  History of acute CVA s/p cerebral arteriogram with emergent mechanical thrombectomy of right MCA M1/M2/M3 occlusions, complicated by contrast extravasation s/p embolization of right MCA M3 branch using coil placement via right femoral approach 05/14/2021 by Dr. Karenann Cai.  Subjective:  Patient laying in bed resting, moaning. Lethargic (just extubated) but follows simple commands. RN at bedside. Moves right side spontaneously, left side withdraws from pain. Right femoral puncture site c/d/i.   Allergies: Patient has no known allergies.  Medications: Prior to Admission medications   Not on File     Vital Signs: BP 134/69   Pulse 74   Temp (!) 97.5 F (36.4 C) (Oral)   Resp (!) 21   Ht 5\' 5"  (1.651 m)   Wt 160 lb (72.6 kg)   SpO2 100%   BMI 26.63 kg/m   Physical Exam Vitals and nursing note reviewed.  Constitutional:      General: She is not in acute distress.    Comments: Lethargic.  Pulmonary:     Effort: Pulmonary effort is normal. No respiratory distress.  Skin:    General: Skin is warm and dry.     Comments: Right femoral puncture site soft without active bleeding or hematoma.  Neurological:     Comments: Lethargic, moaning. Follows simple commands. Moves right side spontaneously, left side withdraws from pain. Distal pulses (DPs) palpable bilaterally.     Imaging: CT Head Wo Contrast  Result Date: 05/15/2021 CLINICAL DATA:  Mental status change with unknown cause.  Found down EXAM: CT HEAD WITHOUT CONTRAST TECHNIQUE: Contiguous axial images were obtained from the base of the skull through the vertex without intravenous contrast. COMPARISON:  01/16/2021 brain MRI FINDINGS: Brain: New band of low-density at the right caudate, anterior putamen, and intervening white matter.  Extensive chronic small vessel ischemia in the cerebral white matter with confluent gliosis and chronic lacunar infarcts. Small chronic left superior cerebellar infarct. No hemorrhage, hydrocephalus, or masslike finding. Vascular: No hyperdense vessel Skull: Negative Sinuses/Orbits: Negative IMPRESSION: 1. Acute appearing perforator infarct at the right basal ganglia. 2. Severe chronic small vessel ischemia. Electronically Signed   By: Monte Fantasia M.D.   On: 05/15/2021 05:15   CT Angio Neck W and/or Wo Contrast  Result Date: 05/15/2021 CLINICAL DATA:  Code stroke. EXAM: CT ANGIOGRAPHY HEAD AND NECK CT PERFUSION BRAIN TECHNIQUE: Multidetector CT imaging of the head and neck was performed using the standard protocol during bolus administration of intravenous contrast. Multiplanar CT image reconstructions and MIPs were obtained to evaluate the vascular anatomy. Carotid stenosis measurements (when applicable) are obtained utilizing NASCET criteria, using the distal internal carotid diameter as the denominator. Multiphase CT imaging of the brain was performed following IV bolus contrast injection. Subsequent parametric perfusion maps were calculated using RAPID software. CONTRAST:  63mL OMNIPAQUE IOHEXOL 350 MG/ML SOLN COMPARISON:  Head CT from earlier today FINDINGS: CTA NECK FINDINGS Aortic arch: Motion degraded.  No acute finding Right carotid system: Mild mixed density plaque at the bifurcation. No stenosis or ulceration. Left carotid system: Mixed density plaque at the bifurcation. No stenosis or ulceration. Vertebral arteries: No proximal subclavian stenosis. Low-density bulge into the left subclavian lumen at the expected location of the left vertebral origin, with left vertebral occlusion. There is V4 segment reconstitution. No flow limiting stenosis or dissection seen on the right. Skeleton: Retropharyngeal fluid.  There is contusion like appearance of the left cheek and the left lateral neck. Muscles on  the left lateral neck may be diffusely edematous and mildly swollen. Other neck: Subcentimeter nodules in the thyroid. No followup recommended (ref: J Am Coll Radiol. 2015 Feb;12(2): 143-50). Upper chest: Left supraclavicular stranding which is contiguous with the left neck edema. No evidence of air leak or contusion. Review of the MIP images confirms the above findings CTA HEAD FINDINGS Anterior circulation: Right M1 occlusion with abrupt cut off. Heavily calcified carotid siphons which limits detection of luminal stenosis. The right ICA is more heavily affected with high-grade narrowing possible at the petrous/cavernous junction and anterior genu. Hypoplastic right A1 segment. Atheromatous irregularity of MCA and ACA branches. Posterior circulation: Reconstituted left V4. Atheromatous plaque at the bilateral V4 segments. The basilar is smooth and widely patent. No PCA branch occlusion or aneurysm. Venous sinuses: Right dominant transverse sigmoid system. No acute thrombosis. Anatomic variants: As above Review of the MIP images confirms the above findings CT Brain Perfusion Findings: ASPECTS: 7 - 8 ( caudate and putamen infarct. There could be anterior insular infarct on the right, but similar to the left and indeterminate due to the degree of chronic changes). CBF (<30%) Volume: 55mL Perfusion (Tmax>6.0s) volume: 110mL Mismatch Volume: 53mL Infarction Location:Lateral right frontal lobe and right basal ganglia Case discussed with Dr. Leonel Ramsay while in progress. IMPRESSION: 1. Emergent large vessel occlusion at the right M1 origin. CT perfusion suggestive 50 cc core infarct and 50 cc of adjacent penumbra. 2. Left vertebral occlusion at its origin (where there is subclavian luminal density from thrombus or plaque) with reconstitution by the V4 segment. 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 3. Retropharyngeal fluid and subcutaneous/intramuscular edema in  the left neck. Given the history this may be from traumatic soft tissue injury or pressure necrosis. 4. Premature atherosclerosis and chronic small vessel disease. High-grade narrowings of the right cavernous ICA which may be accentuated by underfilling from downstream occlusion and right A1 hypoplasia. Electronically Signed   By: Monte Fantasia M.D.   On: 05/15/2021 07:49   CT CEREBRAL PERFUSION W CONTRAST  Result Date: 05/15/2021 CLINICAL DATA:  Code stroke. EXAM: CT ANGIOGRAPHY HEAD AND NECK CT PERFUSION BRAIN TECHNIQUE: Multidetector CT imaging of the head and neck was performed using the standard protocol during bolus administration of intravenous contrast. Multiplanar CT image reconstructions and MIPs were obtained to evaluate the vascular anatomy. Carotid stenosis measurements (when applicable) are obtained utilizing NASCET criteria, using the distal internal carotid diameter as the denominator. Multiphase CT imaging of the brain was performed following IV bolus contrast injection. Subsequent parametric perfusion maps were calculated using RAPID software. CONTRAST:  91mL OMNIPAQUE IOHEXOL 350 MG/ML SOLN COMPARISON:  Head CT from earlier today FINDINGS: CTA NECK FINDINGS Aortic arch: Motion degraded.  No acute finding Right carotid system: Mild mixed density plaque at the bifurcation. No stenosis or ulceration. Left carotid system: Mixed density plaque at the bifurcation. No stenosis or ulceration. Vertebral arteries: No proximal subclavian stenosis. Low-density bulge into the left subclavian lumen at the expected location of the left vertebral origin, with left vertebral occlusion. There is V4 segment reconstitution. No flow limiting stenosis or dissection seen on the right. Skeleton: Retropharyngeal fluid. There is contusion like appearance of the left cheek and the left lateral neck. Muscles on the left lateral neck may be diffusely edematous and mildly swollen. Other neck: Subcentimeter nodules in the  thyroid. No  followup recommended (ref: J Am Coll Radiol. 2015 Feb;12(2): 143-50). Upper chest: Left supraclavicular stranding which is contiguous with the left neck edema. No evidence of air leak or contusion. Review of the MIP images confirms the above findings CTA HEAD FINDINGS Anterior circulation: Right M1 occlusion with abrupt cut off. Heavily calcified carotid siphons which limits detection of luminal stenosis. The right ICA is more heavily affected with high-grade narrowing possible at the petrous/cavernous junction and anterior genu. Hypoplastic right A1 segment. Atheromatous irregularity of MCA and ACA branches. Posterior circulation: Reconstituted left V4. Atheromatous plaque at the bilateral V4 segments. The basilar is smooth and widely patent. No PCA branch occlusion or aneurysm. Venous sinuses: Right dominant transverse sigmoid system. No acute thrombosis. Anatomic variants: As above Review of the MIP images confirms the above findings CT Brain Perfusion Findings: ASPECTS: 7 - 8 ( caudate and putamen infarct. There could be anterior insular infarct on the right, but similar to the left and indeterminate due to the degree of chronic changes). CBF (<30%) Volume: 38mL Perfusion (Tmax>6.0s) volume: 121mL Mismatch Volume: 91mL Infarction Location:Lateral right frontal lobe and right basal ganglia Case discussed with Dr. Leonel Ramsay while in progress. IMPRESSION: 1. Emergent large vessel occlusion at the right M1 origin. CT perfusion suggestive 50 cc core infarct and 50 cc of adjacent penumbra. 2. Left vertebral occlusion at its origin (where there is subclavian luminal density from thrombus or plaque) with reconstitution by the V4 segment. 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 3. Retropharyngeal fluid and subcutaneous/intramuscular edema in the left neck. Given the history this may be from traumatic soft tissue injury or pressure necrosis. 4.  Premature atherosclerosis and chronic small vessel disease. High-grade narrowings of the right cavernous ICA which may be accentuated by underfilling from downstream occlusion and right A1 hypoplasia. Electronically Signed   By: Monte Fantasia M.D.   On: 05/15/2021 07:49    Labs:  CBC: Recent Labs    05/15/21 0430  WBC 10.2  HGB 8.0*  HCT 27.4*  PLT 356    COAGS: No results for input(s): INR, APTT in the last 8760 hours.  BMP: Recent Labs    05/15/21 0430  NA 140  K 3.3*  CL 108  CO2 21*  GLUCOSE 114*  BUN 17  CALCIUM 9.3  CREATININE 1.27*  GFRNONAA 52*    LIVER FUNCTION TESTS: Recent Labs    05/15/21 0430  BILITOT 1.0  AST 26  ALT 19  ALKPHOS 88  PROT 7.6  ALBUMIN 3.5    Assessment and Plan:  History of acute CVA s/p cerebral arteriogram with emergent mechanical thrombectomy of right MCA M1/M2/M3 occlusions, complicated by contrast extravasation s/p embolization of right MCA M3 branch using coil placement via right femoral approach 05/14/2021 by Dr. Karenann Cai. Patient lethargic (just extubated) but follows simple commands, moves right side spontaneously, left side withdraws from pain. Right femoral puncture site stable, distal pulses (DPs) palpable bilaterally. For possible CT head today. Further plans per neurology- appreciate and agree with management. NIR to follow.   Electronically Signed: Earley Abide, PA-C 05/15/2021, 1:11 PM   I spent a total of 15 Minutes at the the patient's bedside AND on the patient's [facility] floor or unit, greater than 50% of which was counseling/coordinating care for CVA s/p revascularization.

## 2021-05-15 NOTE — Progress Notes (Signed)
I called MRI at this time to schedule patient's MRI close to patient's CT at 1300. I called the SWOT nurse at this time.

## 2021-05-15 NOTE — Anesthesia Procedure Notes (Signed)
Procedure Name: Intubation Date/Time: 05/15/2021 7:57 AM Performed by: Gaylene Brooks, CRNA Pre-anesthesia Checklist: Patient identified, Emergency Drugs available, Suction available and Patient being monitored Patient Re-evaluated:Patient Re-evaluated prior to induction Oxygen Delivery Method: Circle System Utilized Preoxygenation: Pre-oxygenation with 100% oxygen Induction Type: IV induction and Rapid sequence Laryngoscope Size: Glidescope and 3 Grade View: Grade I Tube type: Oral Tube size: 7.0 mm Number of attempts: 1 Airway Equipment and Method: Stylet and Oral airway Placement Confirmation: ETT inserted through vocal cords under direct vision,  positive ETCO2 and breath sounds checked- equal and bilateral Secured at: 22 cm Tube secured with: Tape Dental Injury: Teeth and Oropharynx as per pre-operative assessment

## 2021-05-15 NOTE — Anesthesia Postprocedure Evaluation (Signed)
Anesthesia Post Note  Patient: [facility]  Procedure(s) Performed: IR WITH ANESTHESIA (N/A )     Patient location during evaluation: ICU Anesthesia Type: General Level of consciousness: awake (pre-op baseline) Pain management: pain level controlled Vital Signs Assessment: post-procedure vital signs reviewed and stable Respiratory status: spontaneous breathing, nonlabored ventilation and respiratory function stable Cardiovascular status: blood pressure returned to baseline and stable Postop Assessment: no apparent nausea or vomiting Anesthetic complications: no   No complications documented.  Last Vitals:  Vitals:   05/15/21 0700 05/15/21 0740  BP: (!) 177/131 (!) 182/86  Pulse: (!) 103 95  Resp: (!) 21 (!) 41  Temp:    SpO2: 99%     Last Pain:  Vitals:   05/15/21 0430  TempSrc: Tympanic                 Lidia Collum

## 2021-05-15 NOTE — Progress Notes (Signed)
Called by radiology about retropharyngeal effusion which can be concerning for cervical injury. Given unreliability of cervical exam, will place hard c-collar until MRI C-spine can be obtained.   Roland Rack, MD [facility] [phone]  If 7pm- 7am, please page neurology on call as listed in [place].

## 2021-05-15 NOTE — ED Notes (Signed)
Md at bedside. Pt now responding to commands and verbal. Pt was able to tell Md her name.

## 2021-05-15 NOTE — Anesthesia Procedure Notes (Signed)
Performed by: Betha Loa, CRNA

## 2021-05-15 NOTE — ED Provider Notes (Signed)
[place] EMERGENCY DEPARTMENT Provider Note   CSN: [id number] Arrival date & time: 05/15/21  0426     History Chief Complaint  Patient presents with  . Altered Mental Status    Holly Bradley is a 45 y.o. female.  HPI     This is a 45 year old female with reported history of fibroids and hypertension who presents by EMS with concerns for altered mental status.  Per EMS, her roommates called after finding her in the upstairs bathroom.  She was last seen normal around 7 PM.  EMS noted slurred speech and only minimally responsive to painful stimuli.  GCS of 10 in route.  They noted distended hard abdomen and potentially pulsatile mass.  Level 5 caveat for altered mental status.  No past medical history on file.  There are no problems to display for this patient. PMH:  HTN  OB History   No obstetric history on file.     No family history on file.     Home Medications Prior to Admission medications   Not on File    Allergies    Patient has no known allergies.  Review of Systems   Review of Systems  Unable to perform ROS: Acuity of condition    Physical Exam Updated Vital Signs BP (!) 177/131   Pulse (!) 103   Temp 97.8 F (36.6 C) (Tympanic)   Resp (!) 21   Ht 1.651 m (5\' 5" )   Wt 72.6 kg   SpO2 99%   BMI 26.63 kg/m   Physical Exam Vitals and nursing note reviewed.  Constitutional:      Appearance: She is well-developed.     Comments: Somnolent, minimally arousable, arouses to loud voice and painful stimuli  HENT:     Head: Normocephalic and atraumatic.     Nose: Nose normal.     Mouth/Throat:     Mouth: Mucous membranes are moist.  Eyes:     Pupils: Pupils are equal, round, and reactive to light.     Comments: Pupils 4 mm reactive bilaterally  Cardiovascular:     Rate and Rhythm: Normal rate and regular rhythm.     Heart sounds: Normal heart sounds.  Pulmonary:     Effort: Pulmonary effort is normal. No respiratory  distress.     Breath sounds: No wheezing.  Abdominal:     General: Bowel sounds are normal.     Palpations: Abdomen is soft. There is mass.     Comments: Abdominal distention with palpable nonpulsatile masses  Musculoskeletal:     Cervical back: Neck supple.     Right lower leg: No edema.     Left lower leg: No edema.  Skin:    General: Skin is warm and dry.  Neurological:     GCS: GCS eye subscore is 3. GCS verbal subscore is 4. GCS motor subscore is 5.  Psychiatric:     Comments: Unable to assess     ED Results / Procedures / Treatments   Labs (all labs ordered are listed, but only abnormal results are displayed) Labs Reviewed  CBC WITH DIFFERENTIAL/PLATELET - Abnormal; Notable for the following components:      Result Value   Hemoglobin 8.0 (*)    HCT 27.4 (*)    MCV 70.8 (*)    MCH 20.7 (*)    MCHC 29.2 (*)    RDW 18.9 (*)    Neutro Abs 8.7 (*)    Lymphs Abs 0.6 (*)  All other components within normal limits  URINALYSIS, ROUTINE W REFLEX MICROSCOPIC - Abnormal; Notable for the following components:   Color, Urine STRAW (*)    Hgb urine dipstick SMALL (*)    Protein, ur 100 (*)    All other components within normal limits  COMPREHENSIVE METABOLIC PANEL - Abnormal; Notable for the following components:   Potassium 3.3 (*)    CO2 21 (*)    Glucose, Bld 114 (*)    Creatinine, Ser 1.27 (*)    GFR, Estimated 52 (*)    All other components within normal limits  AMMONIA - Abnormal; Notable for the following components:   Ammonia <9 (*)    All other components within normal limits  RESP PANEL BY RT-PCR (FLU A&B, COVID) ARPGX2  ETHANOL  RAPID URINE DRUG SCREEN, HOSP PERFORMED  HCG, QUANTITATIVE, PREGNANCY    EKG EKG Interpretation  Date/Time:  Tuesday May 15 2021 04:34:05 EDT Ventricular Rate:  89 PR Interval:  234 QRS Duration: 95 QT Interval:  421 QTC Calculation: 513 R Axis:   92 Text Interpretation: Sinus rhythm Prolonged PR interval Left atrial  enlargement Anterior infarct, old Prolonged QT interval Confirmed by Thayer Jew [phone]) on 05/15/2021 4:44:56 AM   Radiology CT Head Wo Contrast  Result Date: 05/15/2021 CLINICAL DATA:  Mental status change with unknown cause.  Found down EXAM: CT HEAD WITHOUT CONTRAST TECHNIQUE: Contiguous axial images were obtained from the base of the skull through the vertex without intravenous contrast. COMPARISON:  01/16/2021 brain MRI FINDINGS: Brain: New band of low-density at the right caudate, anterior putamen, and intervening white matter. Extensive chronic small vessel ischemia in the cerebral white matter with confluent gliosis and chronic lacunar infarcts. Small chronic left superior cerebellar infarct. No hemorrhage, hydrocephalus, or masslike finding. Vascular: No hyperdense vessel Skull: Negative Sinuses/Orbits: Negative IMPRESSION: 1. Acute appearing perforator infarct at the right basal ganglia. 2. Severe chronic small vessel ischemia. Electronically Signed   By: Monte Fantasia M.D.   On: 05/15/2021 05:15    Procedures .Critical Care Performed by: Merryl Hacker, MD Authorized by: Merryl Hacker, MD   Critical care provider statement:    Critical care time (minutes):  60   Critical care was necessary to treat or prevent imminent or life-threatening deterioration of the following conditions:  CNS failure or compromise   Critical care was time spent personally by me on the following activities:  Discussions with consultants, evaluation of patient's response to treatment, examination of patient, ordering and performing treatments and interventions, ordering and review of laboratory studies, ordering and review of radiographic studies, pulse oximetry, re-evaluation of patient's condition, obtaining history from patient or surrogate and review of old charts    Angiocath insertion Performed by: Merryl Hacker  Consent: Verbal consent obtained. Risks and benefits: risks, benefits  and alternatives were discussed Time out: Immediately prior to procedure a "time out" was called to verify the correct patient, procedure, equipment, support staff and site/side marked as required.  Preparation: Patient was prepped and draped in the usual sterile fashion.  Vein Location: right EJ  Ultrasound Guided  Gauge: 18  Normal blood return and flush without difficulty Patient tolerance: Patient tolerated the procedure well with no immediate complications.    Medications Ordered in ED Medications  nicardipine (CARDENE) 20mg  in 0.86% saline 264ml IV infusion (0.1 mg/ml) (7.5 mg/hr Intravenous Rate/Dose Change 05/15/21 0636)    ED Course  I have reviewed the triage vital signs and the nursing notes.  Pertinent labs & imaging results that were available during my care of the patient were reviewed by me and considered in my medical decision making (see chart for details).  Clinical Course as of 05/15/21 0713  Tue May 15, 2021  0609 Neurology to evaluate patient. [CH]  7510 On repeat exam with neurology at the bedside.  Small possible acute basal ganglia stroke.  Patient still quite encephalopathic.  She now is following some commands.  She seems to now have a slight left-sided facial droop and is not localizing with the left upper extremity.  Blood pressure remains 258-527 systolic.  Per neurology, CTA head and neck to evaluate for LVO.  Last seen normal 7 PM.  She is outside any tPA window. [CH]    Clinical Course User Index [CH] Ebba Goll, Barbette Hair, MD   MDM Rules/Calculators/A&P                          Patient presents with altered mental status.  Per EMS was found down.  Last seen normal 7 PM.  GCS of 10 in route.  She has a GCS of 12 for me.  She appears to be moving all 4 extremities initially although she is not following commands and is difficult to arouse.  She is currently protecting her airway.  Work-up initiated.  She is severely hypertensive with a blood pressure of  263/133.  Review of her old records reveals that she is a poorly controlled hypertensive at baseline.  Will obtain CT to rule out bleed.  Additional work-up also ordered.  Patient was started on nicardipine for blood pressure control with a SBP goal of 180.  CT scan does not show acute bleed.  There is some concern for an acute infarct of the basal ganglia.  Neurology was officially consulted.  On my repeat evaluation with neurology at the bedside, patient is responding a little more readily.  She is definitely localizing more with her right side and has decreased movement of the left upper extremity with potentially a left facial droop.  Blood pressures are now in the 782 systolic range.  She remains encephalopathic.  It does not seem that a small acute stroke would cause her encephalopathy; however, given focal findings on repeat exam, CTA was ordered.  She was not in the tPA window upon arrival.  However, if she had an LVO, she would potentially be a candidate for clot retrieval.  We will continue nicardipine.  Disposition pending.  Other lab work-up reviewed.  Largely reassuring.  No significant metabolic derangements.  UDS and alcohol levels negative. Final Clinical Impression(s) / ED Diagnoses Final diagnoses:  None    Rx / DC Orders ED Discharge Orders    None       Kymberli Wiegand, Barbette Hair, MD 05/15/21 [phone]

## 2021-05-15 NOTE — Transfer of Care (Signed)
Immediate Anesthesia Transfer of Care Note  Patient: Holly Bradley  Procedure(s) Performed: IR WITH ANESTHESIA (N/A )  Patient Location: ICU  Anesthesia Type:General  Level of Consciousness: responds to stimulation  Airway & Oxygen Therapy: Patient Spontanous Breathing and Patient connected to face mask oxygen  Post-op Assessment: Report given to RN, Post -op Vital signs reviewed and stable and does not follow commands, moving right side, no movement noted on left  Post vital signs: Reviewed and stable  Last Vitals:  Vitals Value Taken Time  BP 149/72 05/15/21 1016  Temp    Pulse 73 05/15/21 1018  Resp 15 05/15/21 1018  SpO2 100 % 05/15/21 1018  Vitals shown include unvalidated device data.  Last Pain:  Vitals:   05/15/21 0430  TempSrc: Tympanic         Complications: No complications documented.

## 2021-05-15 NOTE — ED Notes (Signed)
Pt's brother Bowen Goyal) can be reached at [phone].

## 2021-05-15 NOTE — Progress Notes (Signed)
Patient arrived to 4N ICU at 1015 from IR with no complications. The only belongings patient has at bedside are clothing: a jacket, shirt, pants, underwear, and socks.

## 2021-05-15 NOTE — Sedation Documentation (Signed)
Right femoral sheath removed, Perclose closure device deployed to arterial site.

## 2021-05-15 NOTE — Telephone Encounter (Signed)
Pt contacted me regarding our appointment this week and she thinks she would be fine to fill her own pill box despite the errors she made last week that I told her about. She said she would reach out should she have any issues or concerns.  I also asked her about that kidney scan but she didn't respond to that. It was scheduled but then she went out of town and it looks like she didn't resch it.  Her b/p have been very elevated for a while now but she is adamant she takes her meds.  She has clinic appointment next week I will meet her at.   [name], EMT-Paramedic  05/15/21

## 2021-05-15 NOTE — H&P (Addendum)
NEUROLOGY CONSULTATION NOTE   Date of service: May 15, 2021 [name] MRN:  [id number] DOB:  [birth year] Reason for consult: "Acute R BG stroke on CT Head" Requesting Provider: Merryl Hacker, MD _ _ _   _ __   _ __ _ _  __ __   _ __   __ _  History of Present Illness  Holly Bradley is a 45 y.o. female with PMH significant for uncontrolled HTN, tobacco use disorder, uterine fibroids, heart failure, paroxysmal SVT, paranoid psychosis who presents with altered mental status.  She is unable to provide any history. Very somnolent on my evaluation and appears to have decreased movement in the LUE compared to RUE.  Per notes, was last seen normal at 1900 on 05/14/21. Roommates called EMS after finding her in the upstrairs bathroom. She was wedged between the toilet and the sink.  She was minimally responsive to pain and was brought in to the hospital for further evaluation.  Vitals in the ED with elevated SBP to 200s, with DBP in 100s. No fever, tachycardic. UDS negative. CBC with anemia and a Hb of 8.0. Low potassium on chemistry.  CTH initially read as small acute appearing perforator infarct in the R BG and notable for severe small vessel ischemia. However concerning for potential R MCA stroke and thus CTA head and neck and CTP was obtained with a R MCA M1 occlusion and a core of 50cc and 78ml of penumbra.  MRS: presumed 0 TPA: outside window LKW: 1900 on 05/14/21 Thrombectomy: Yes, R MCA M1 occlusion  NIHSS components Score: Comment  1a Level of Conscious 0[]  1[]  2[x]  3[]      1b LOC Questions 0[x]  1[]  2[]       1c LOC Commands 0[x]  1[]  2[]       2 Best Gaze 0[x]  1[]  2[]       3 Visual 0[x]  1[]  2[]  3[]      4 Facial Palsy 0[]  1[x]  2[]  3[]      5a Motor Arm - left 0[]  1[]  2[]  3[]  4[x]  UN[]    5b Motor Arm - Right 0[x]  1[]  2[]  3[]  4[]  UN[]    6a Motor Leg - Left 0[x]  1[]  2[]  3[]  4[]  UN[]    6b Motor Leg - Right 0[]  1[]  2[x]  3[]  4[]  UN[]    7 Limb Ataxia 0[x]  1[]  2[]  3[]   UN[]     8 Sensory 0[]  1[x]  2[]  UN[]      9 Best Language 0[x]  1[]  2[]  3[]      10 Dysarthria 0[x]  1[]  2[]  UN[]      11 Extinct. and Inattention 0[x]  1[]  2[]       TOTAL: 9      ROS   Unable to obtain 2/2 AMS.  Past History   Unable to obtain PMH, PSH, FHx, SHx due to encephalopathy.  No family history on file. Social History   Socioeconomic History  . Marital status: Single    Spouse name: Not on file  . Number of children: Not on file  . Years of education: Not on file  . Highest education level: Not on file  Occupational History  . Not on file  Tobacco Use  . Smoking status: Not on file  . Smokeless tobacco: Not on file  Substance and Sexual Activity  . Alcohol use: Not on file  . Drug use: Not on file  . Sexual activity: Not on file  Other Topics Concern  . Not on file  Social History Narrative  . Not on file   Social  Determinants of Health   Financial Resource Strain: Not on file  Food Insecurity: Not on file  Transportation Needs: Not on file  Physical Activity: Not on file  Stress: Not on file  Social Connections: Not on file   Not on File  Medications  (Not in a hospital admission)    Vitals   Vitals:   05/15/21 0630 05/15/21 0635 05/15/21 0640 05/15/21 0650  BP: (!) 221/102 (!) 204/99 (!) 190/93 (!) 177/87  Pulse: (!) 106 (!) 106 96 96  Resp: 20 (!) 28 (!) 26 18  Temp:      TempSrc:      SpO2: 99% 98% 98% 97%  Weight:      Height:         Body mass index is 26.63 kg/m.  Physical Exam   General: Laying comfortably in bed; in no acute distress. HENT: Normal oropharynx and mucosa. Normal external appearance of ears and nose. Neck: Supple, no pain or tenderness CV: No JVD. No peripheral edema. Pulmonary: Symmetric Chest rise. Normal respiratory effort. Abdomen: Soft to touch, non-tender. Ext: No cyanosis, edema, or deformity Skin: No rash. Normal palpation of skin.  Musculoskeletal: Normal digits and nails by inspection. No  clubbing.  Neurologic Examination  Mental status/Cognition: Somnolent, eyes closed. Opens eyes partially to loud voice. Goes back to sleep in a couple seconds. Resists eye opening. Oriented to self only, very poor attention. Speech/language: Non fluent, comprehension intact to simple commands. Unable to repeat or name objects. Cranial nerves:   CN II Closes eyes tight shut, unable to assess pupil size. Both pupils appear round.   CN III,IV,VI Unable to assess EOMI, no gaze preference or deviation, no nystagmus in primary gaze.   CN V    CN VII Mild flattening of the nasolabial fold on the right. More noticeable when asked to smile.   CN VIII Turns head towards speech   CN IX & X Unable to assess   CN XI    CN XII midline tongue protrusion   Motor:  Muscle bulk: normal, tone decreased in LUE.  Encephalopathy significantly limits full strength testing.  Mvmt Root Nerve  Muscle Right Left Comments  SA C5/6 Ax Deltoid 4 0   EF C5/6 Mc Biceps     EE C6/7/8 Rad Triceps     WF C6/7 Med FCR     WE C7/8 PIN ECU     F Ab C8/T1 U ADM/FDI 5 0   HF L1/2/3 Fem Illopsoas 4 2   KE L2/3/4 Fem Quad     DF L4/5 D Peron Tib Ant     PF S1/2 Tibial Grc/Sol      Reflexes:  Right Left Comments  Pectoralis      Biceps (C5/6) 2 2   Brachioradialis (C5/6) 2 2    Triceps (C6/7) 2 2    Patellar (L3/4) 2 2    Achilles (S1)      Hoffman      Plantar     Jaw jerk    Sensation:  Light touch Localizes to pinch in all extremities.   Pin prick    Temperature    Vibration   Proprioception    Coordination/Complex Motor:  - Finger to Nose intact on the right. - Heel to shin unable to do encephalopathy - Rapid alternating movement unable to assess due to encephalopathy - Gait: Deferred.  Labs   CBC:  Recent Labs  Lab 05/15/21 0430  WBC 10.2  NEUTROABS 8.7*  HGB 8.0*  HCT 27.4*  MCV 70.8*  PLT 332    Basic Metabolic Panel:  Lab Results  Component Value Date   NA 140 05/15/2021   K  3.3 (L) 05/15/2021   CO2 21 (L) 05/15/2021   GLUCOSE 114 (H) 05/15/2021   BUN 17 05/15/2021   CREATININE 1.27 (H) 05/15/2021   CALCIUM 9.3 05/15/2021   GFRNONAA 52 (L) 05/15/2021   Lipid Panel: No results found for: LDLCALC HgbA1c: No results found for: HGBA1C Urine Drug Screen:     Component Value Date/Time   LABOPIA NONE DETECTED 05/15/2021 0445   COCAINSCRNUR NONE DETECTED 05/15/2021 0445   LABBENZ NONE DETECTED 05/15/2021 0445   AMPHETMU NONE DETECTED 05/15/2021 [place] DETECTED 05/15/2021 0445   LABBARB NONE DETECTED 05/15/2021 0445    Alcohol Level     Component Value Date/Time   ETH <10 05/15/2021 0438    CT Head without contrast:  Concerning for potential R MCA stroke.  CT angio Head and Neck with contrast: R MCA M1 occlusion.  CT Perfusion: 50cc core, 50cc penumbra.  MRI Brain: Pending.  rEEG:  Pending.  Impression   Holly Bradley is a 45 y.o. female with PMH significant for uncontrolled HTN, tobacco use disorder, uterine fibroids, heart failure, paroxysmal SVT, paranoid psychosis who presents with significant encephalopathy and left sided weakness.  Workup with CTH, CTA and CTP with a R MCA stroke with R M1 occlusion and a penumbra of 50cc with a 50cc core.  She will be taken for thrombectomy.  Primary Diagnosis:  Cerebral infarction due to thrombosis of right middle cerebral artery.   Secondary Diagnosis: Hypertension Emergency (SBP > 180 or DBP > 120 & end organ damage), Chronic systolic (congestive) heart failure and Hypokalemia  Recommendations   Plan:  R MCA Stroke with R M1 occlusion: Plan: - Frequent Neuro checks per stroke unit protocol - MRI Brain without contrast pending. - Vascular imaging with MRA Angio Head without contrast and US Carotid doppler - TTE pending. - Lipid panel with LDL pending. - Will start statin if LDL > 70 - HbA1c pending. - Antithrombotic - Aspirin 81mg  daily - Recommend DVT ppx - SBP goal -  Per Neuro IR for the first 24 hours after thrombectomy - Recommend Telemetry monitoring for arrythmia - Recommend bedside swallow screen prior to PO intake. - Stroke education booklet - Recommend PT/OT/SLP consult   Hypertensive emergency: - Goal SBP per Neuro IR after thrombectomy for the first 24 hours.  Concern for Cervical spine injury: concern for retropharyngeal fluid and subcutaneous/intramuscular edema in left neck. - Cervical collar until MRI cervical spine.  ______________________________________________________________________  This patient is critically ill and at significant risk of neurological worsening, death and care requires constant monitoring of vital signs, hemodynamics,respiratory and cardiac monitoring, neurological assessment, discussion with family, other specialists and medical decision making of high complexity. I spent 100 minutes of neurocritical care time  in the care of  this patient. This was time spent independent of any time provided by nurse practitioner or PA.  Donnetta Simpers [facility] Pager Number [phone] 05/15/2021  8:04 AM   Thank you for the opportunity to take part in the care of this patient. If you have any further questions, please contact the neurology consultation attending.  Signed,  Oneida Castle Pager Number [phone] _ _ _   _ __   _ __ _ _  __ __   _ __   __ _

## 2021-05-16 ENCOUNTER — Inpatient Hospital Stay (HOSPITAL_COMMUNITY): Payer: Medicaid Other

## 2021-05-16 ENCOUNTER — Encounter (HOSPITAL_COMMUNITY): Payer: Self-pay | Admitting: Radiology

## 2021-05-16 DIAGNOSIS — I63311 Cerebral infarction due to thrombosis of right middle cerebral artery: Principal | ICD-10-CM

## 2021-05-16 DIAGNOSIS — I6389 Other cerebral infarction: Secondary | ICD-10-CM

## 2021-05-16 DIAGNOSIS — I6300 Cerebral infarction due to thrombosis of unspecified precerebral artery: Secondary | ICD-10-CM

## 2021-05-16 LAB — LIPID PANEL
Cholesterol: 131 mg/dL (ref 0–200)
HDL: 19 mg/dL — ABNORMAL LOW (ref >40)
LDL Cholesterol: 76 mg/dL (ref 0–99)
Total CHOL/HDL Ratio: 6.9 RATIO
Triglycerides: 178 mg/dL — ABNORMAL HIGH (ref <150)
VLDL: 36 mg/dL (ref 0–40)

## 2021-05-16 LAB — GLUCOSE, CAPILLARY
Glucose-Capillary: 132 mg/dL — ABNORMAL HIGH (ref 70–99)
Glucose-Capillary: 157 mg/dL — ABNORMAL HIGH (ref 70–99)

## 2021-05-16 LAB — ECHOCARDIOGRAM COMPLETE
AR max vel: 2.3 cm2
AV Area VTI: 2.19 cm2
AV Area mean vel: 2.19 cm2
AV Mean grad: 12 mmHg
AV Peak grad: 21.7 mmHg
Ao pk vel: 2.33 m/s
Area-P 1/2: 3.42 cm2
Calc EF: 49.7 %
Height: 65 in
S' Lateral: 3.2 cm
Single Plane A2C EF: 46 %
Single Plane A4C EF: 51.5 %
Weight: 2560 oz

## 2021-05-16 LAB — SODIUM
Sodium: 140 mmol/L (ref 135–145)
Sodium: 143 mmol/L (ref 135–145)

## 2021-05-16 IMAGING — CT CT HEAD W/O CM
3 of 4 series · 13 of 47 positions shown, 15 images · non-contrast
Comparison: Prior CT and MRI from [DATE].

CLINICAL DATA: Follow-up examination for acute stroke.

EXAM:
CT HEAD WITHOUT CONTRAST
TECHNIQUE: Contiguous axial images were obtained from the base of the skull
through the vertex without intravenous contrast.

[Series 3: head wo · axial · 0.42mm/px · z∈[+1042,+1148]mm · 7 of 29 slices shown, 9 images]
[im 4/29  brain]
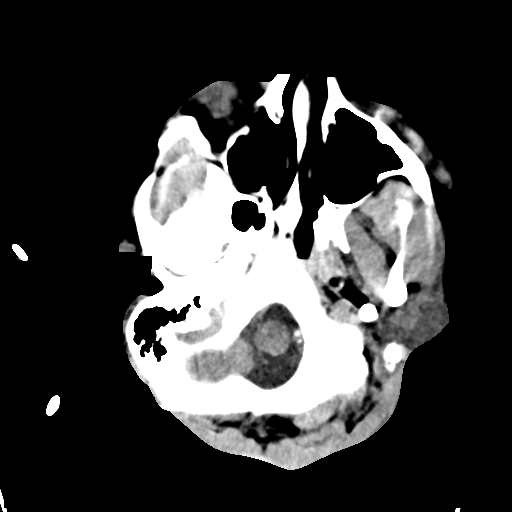
[im 4/29  bone]
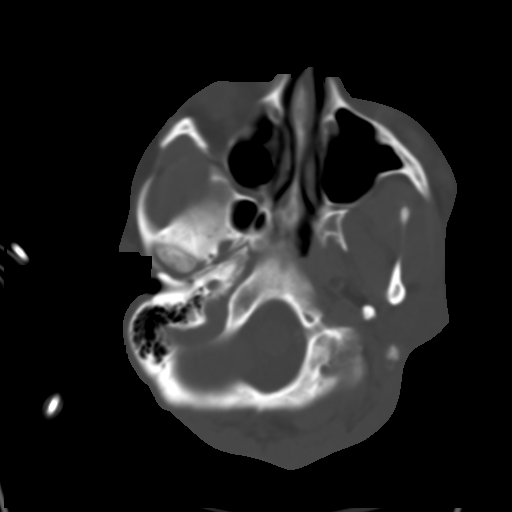
[im 8/29  brain]
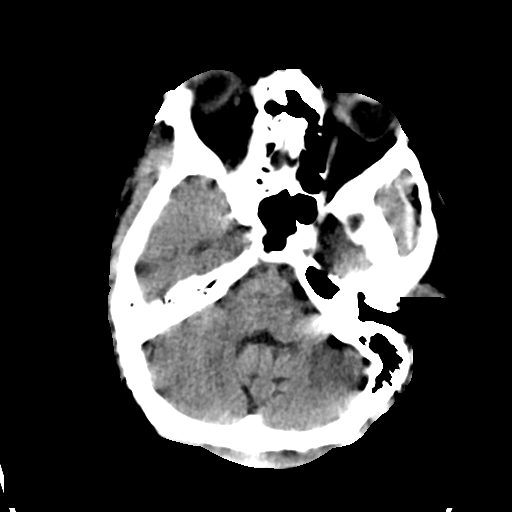
[im 11/29  brain]
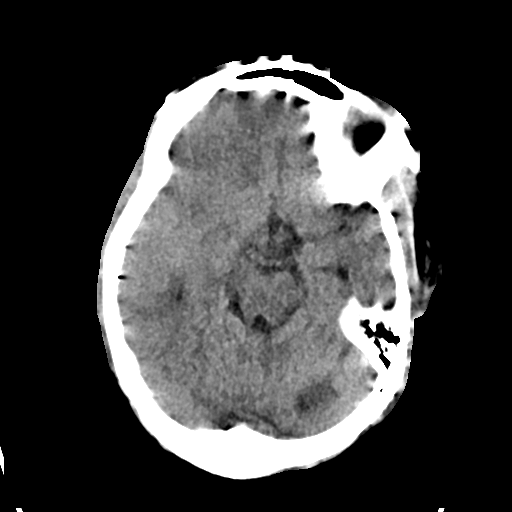
[im 15/29  brain]
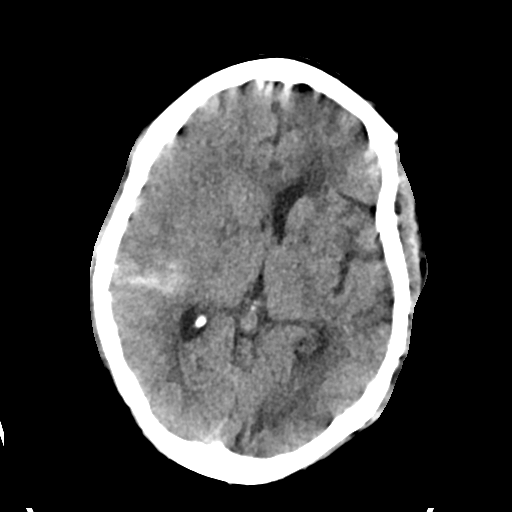
[im 18/29  brain]
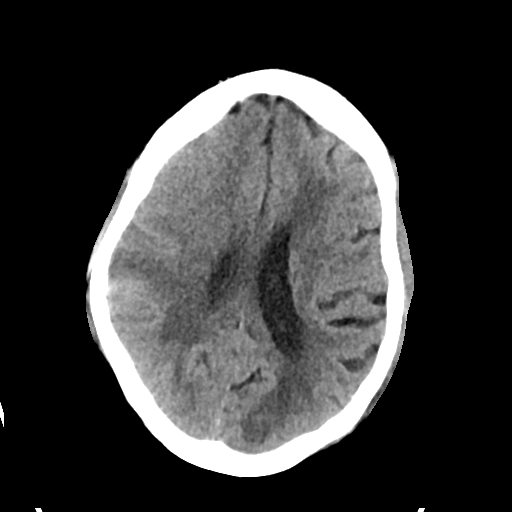
[im 18/29  bone]
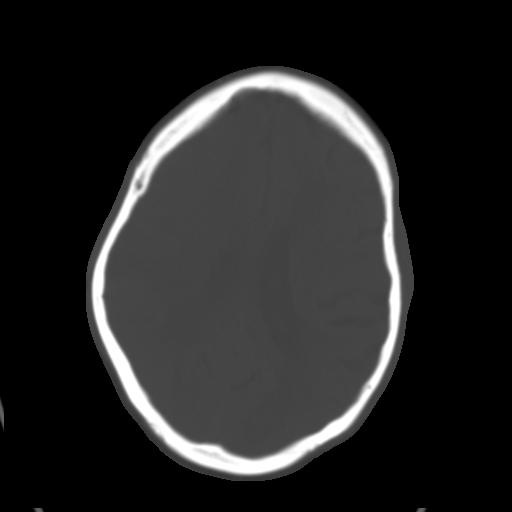
[im 22/29  brain]
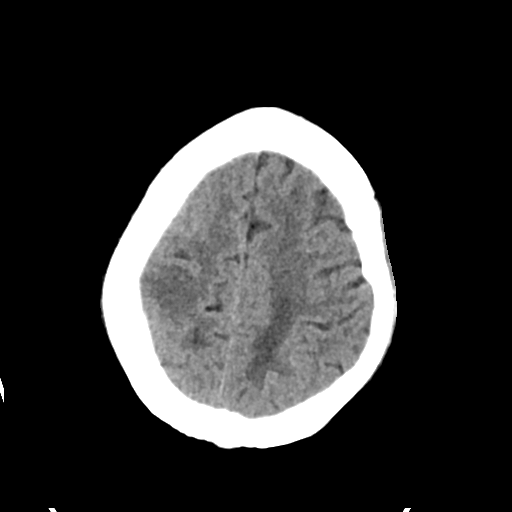
[im 25/29  brain]
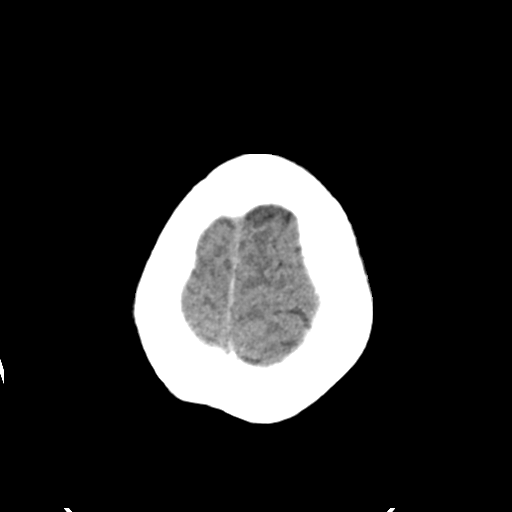

[Series 5: cor soft · coronal · 0.29mm/px · 3 of 69 slices shown]
[im 23/69  brain]
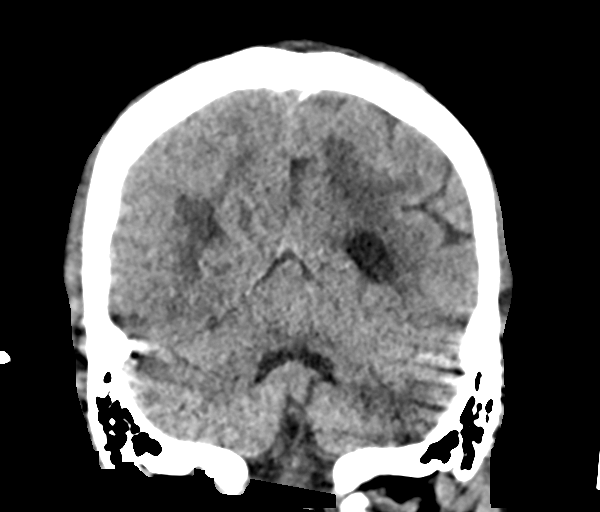
[im 31/69  brain]
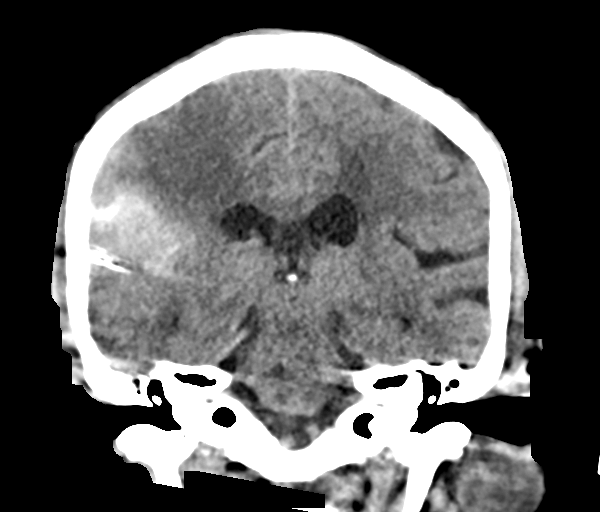
[im 38/69  brain]
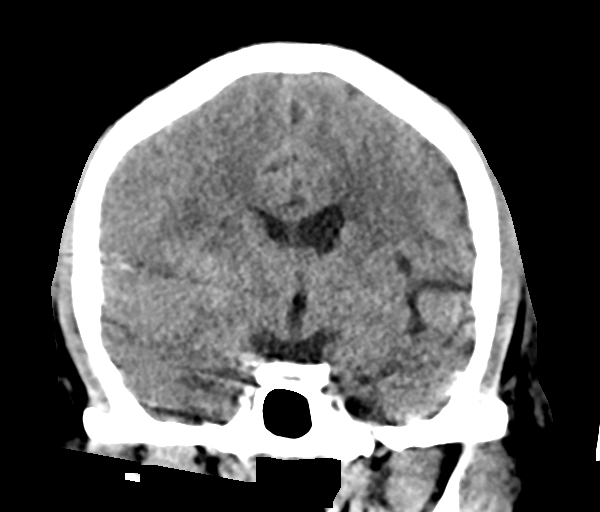

[Series 6: sag soft · sagittal · 0.29mm/px · 3 of 57 slices shown]
[im 22/57  brain]
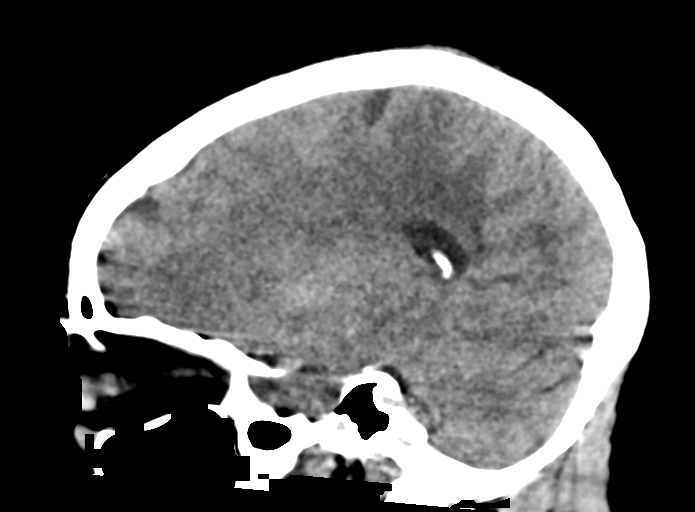
[im 28/57  brain]
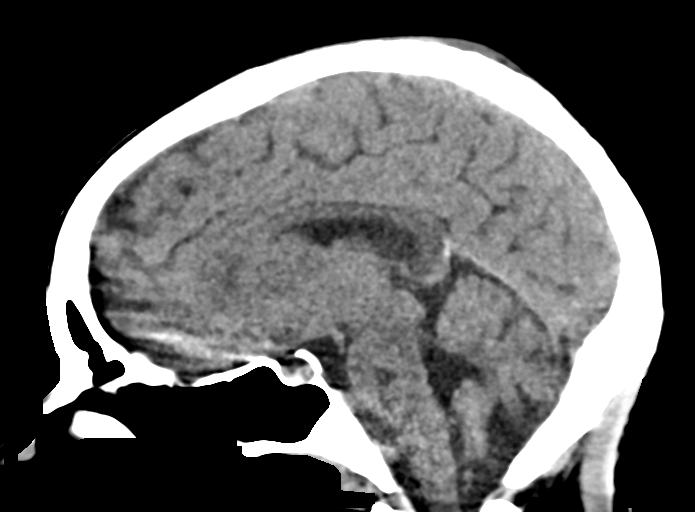
[im 34/57  brain]
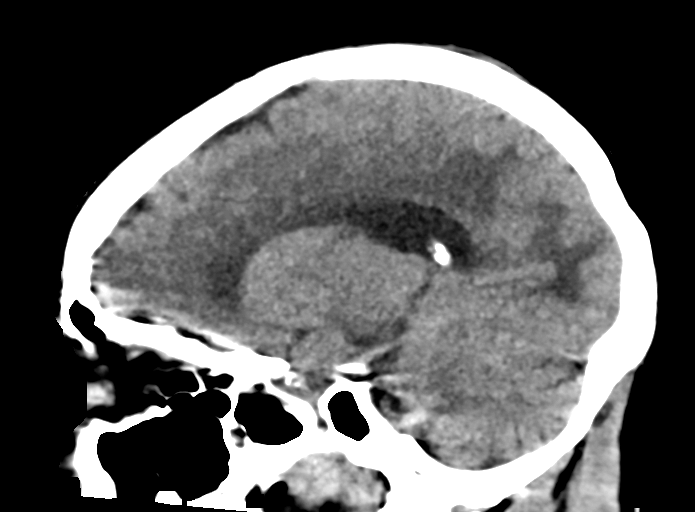

[13 of 47 positions shown; findings below may reference images not displayed]

FINDINGS: Brain: Examination degraded by motion artifact and patient
positioning.

There has been continued interval evolution of moderately large
right MCA distribution infarct, grossly stable in size and
distribution from recent MRI. Scattered areas of contrast staining
within the area of infarction has markedly decreased and nearly
resolved. Persistent subarachnoid hemorrhage at the posterior right
sylvian fissure and overlying right frontoparietal region, decreased
in prominence from previous, likely reflecting decreased contrast
staining with some degree of resorption of blood. Streak artifact
from superimposed embolization material at the peripheral right
frontoparietal region again noted. Trace intraventricular hemorrhage
related to redistribution again noted. There is increased regional
mass effect with sulcal effacement within the area of infarction,
with mildly increased mass effect and effacement of the right
lateral ventricle. 2 mm of right-to-left shift is now evident.
Basilar cisterns remain patent. No hydrocephalus or trapping.

There is an apparent new area of parenchymal hypodensity involving
the lateral left cerebellum (series 3, image 8). While this finding
is favored to be artifactual, a possible new acute ischemic infarct
is difficult to exclude, and could be considered in the correct
clinical setting. Area of involvement measures approximately 2.4 cm.

No other acute intracranial abnormality. Chronic left cerebellar
infarct again noted. Underlying atrophy with advanced chronic
microvascular ischemic disease with multiple additional remote
lacunar infarcts noted as well. No extra-axial collection.

Vascular: No visible new or recurrent hyperdense vessel. Calcified
atherosclerosis present at the skull base.

Skull: No new scalp soft tissue abnormality.  She calvarium intact.

Sinuses/Orbits: Globes and orbital soft tissues demonstrate no new
obvious abnormality, although evaluation limited by motion.
Paranasal sinuses remain clear. No mastoid effusion.

Other: None.
IMPRESSION: 1. Continued interval evolution of moderately large right MCA
distribution infarct. Associated regional mass effect has mildly
increased, with new 2 mm of right-to-left midline shift. No
hydrocephalus or trapping.
2. Decreased prominence of subarachnoid hemorrhage at the posterior
right Sylvian fissure and overlying right frontoparietal region.
Trace intraventricular hemorrhage related to redistribution.
3. Question new area of parenchymal hypodensity involving the
lateral left cerebellum. While this finding is favored to be
artifactual, a possible new acute ischemic infarct is difficult to
exclude, and could be considered in the correct clinical setting.
Attention at follow-up recommended.
4. No other new acute intracranial abnormality.

## 2021-05-16 IMAGING — DX DG ABD PORTABLE 1V
1 series · 1 of 1 positions shown · non-contrast
Comparison: None.

CLINICAL DATA: Check feeding catheter placement

EXAM:
PORTABLE ABDOMEN - 1 VIEW

[abdomen]
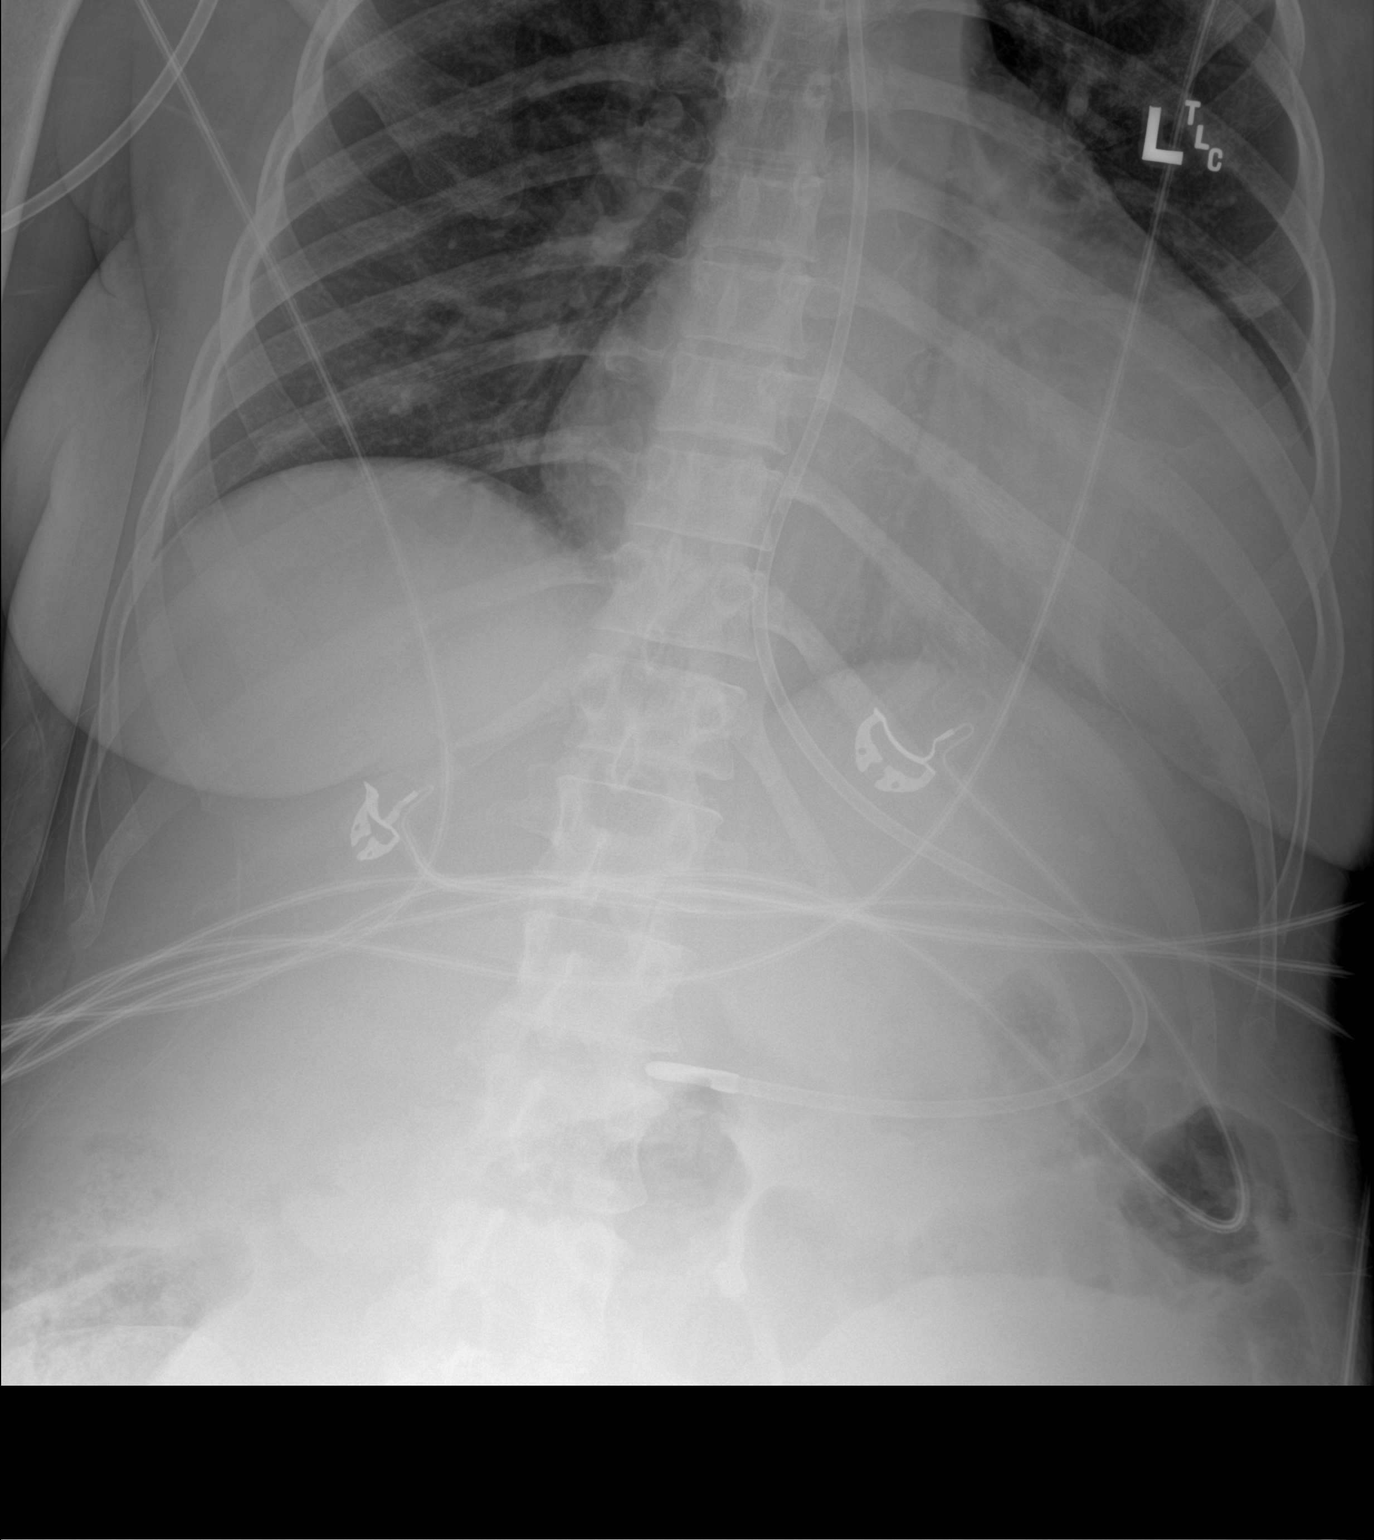

[1 of 1 positions shown; findings below may reference images not displayed]

FINDINGS: Scattered large and small bowel gas is noted. Feeding catheter is
noted in the distal stomach. No free air is noted.
IMPRESSION: Feeding catheter in the distal stomach.

## 2021-05-16 MED ORDER — AMIODARONE HCL 200 MG PO TABS
200.0000 mg | ORAL_TABLET | Freq: Every day | ORAL | Status: DC
  Administered 2021-05-16 – 2021-05-17 (×2): 200 mg
  Filled 2021-05-16 (×2): qty 1

## 2021-05-16 MED ORDER — ISOSORBIDE MONONITRATE ER 30 MG PO TB24: 60.0000 mg | ORAL_TABLET | Freq: Every day | ORAL | Status: DC

## 2021-05-16 MED ORDER — ATORVASTATIN CALCIUM 10 MG PO TABS
20.0000 mg | ORAL_TABLET | Freq: Every day | ORAL | Status: DC
  Administered 2021-05-16 – 2021-05-17 (×2): 20 mg via NASOGASTRIC
  Filled 2021-05-16 (×2): qty 2

## 2021-05-16 MED ORDER — SODIUM CHLORIDE 3 % IV SOLN
INTRAVENOUS | Status: DC
  Filled 2021-05-16 (×5): qty 500

## 2021-05-16 MED ORDER — HYDRALAZINE HCL 50 MG PO TABS
100.0000 mg | ORAL_TABLET | Freq: Three times a day (TID) | ORAL | Status: DC
  Administered 2021-05-16 – 2021-05-17 (×3): 100 mg
  Filled 2021-05-16 (×3): qty 2

## 2021-05-16 MED ORDER — METOPROLOL TARTRATE 50 MG PO TABS
100.0000 mg | ORAL_TABLET | Freq: Two times a day (BID) | ORAL | Status: DC
  Administered 2021-05-16 – 2021-05-17 (×3): 100 mg
  Filled 2021-05-16 (×3): qty 2

## 2021-05-16 MED ORDER — LABETALOL HCL 5 MG/ML IV SOLN
20.0000 mg | INTRAVENOUS | Status: DC | PRN
  Administered 2021-05-16 – 2021-05-17 (×6): 20 mg via INTRAVENOUS
  Filled 2021-05-16 (×6): qty 4

## 2021-05-16 MED ORDER — PROSOURCE TF PO LIQD
45.0000 mL | Freq: Two times a day (BID) | ORAL | Status: DC
  Administered 2021-05-16 – 2021-06-13 (×56): 45 mL
  Filled 2021-05-16 (×55): qty 45

## 2021-05-16 MED ORDER — OSMOLITE 1.5 CAL PO LIQD
1000.0000 mL | ORAL | Status: DC
  Administered 2021-05-16 – 2021-05-20 (×4): 1000 mL
  Filled 2021-05-16: qty 1000

## 2021-05-16 MED ORDER — LABETALOL HCL 5 MG/ML IV SOLN
20.0000 mg | INTRAVENOUS | Status: DC | PRN
  Administered 2021-05-16 (×2): 20 mg via INTRAVENOUS
  Filled 2021-05-16 (×3): qty 4

## 2021-05-16 NOTE — Procedures (Signed)
Cortrak  Person Inserting Tube:  Esaw Dace, RD Tube Type:  Cortrak - 43 inches Tube Location:  Right nare Initial Placement:  Stomach Secured by: Bridle Technique Used to Measure Tube Placement:  Documented cm marking at nare/ corner of mouth Cortrak Secured At:  69 cm Procedure Comments:  Cortrak Tube Team Note:  Consult received to place a Cortrak feeding tube.   X-ray is required, abdominal x-ray has been ordered by the Cortrak team. Please confirm tube placement before using the Cortrak tube.   If the tube becomes dislodged please keep the tube and contact the Cortrak team at [email or web address] (password [email or web address]) for replacement.  If after hours and replacement cannot be delayed, place a NG tube and confirm placement with an abdominal x-ray.   Kerman Passey MS, RDN, LDN, CNSC Registered Dietitian III Clinical Nutrition RD Pager and On-Call Pager Number Located in [place]

## 2021-05-16 NOTE — Progress Notes (Signed)
Rehab Admissions Coordinator Note:  Patient was screened by Cleatrice Burke for appropriateness for an Inpatient Acute Rehab Consult per therapy recs. .  At this time, we are recommending Inpatient Rehab consult. I will place order per protocol.  Cleatrice Burke RN MSN 05/16/2021, 7:04 PM  I can be reached at [phone].

## 2021-05-16 NOTE — Progress Notes (Signed)
OT NOTE  RN STAFF  Please check splint every 4 hours during shift ( remove splint ) to assess for: * pain * redness *swelling  If any symptoms above present remove splint for 15 minutes. If symptoms continue - keep the splint removed and notify OT staff [phone] immediately.   Keep the UE elevated at all times on pillows / towels.  Splint should have two splint covers (blue cloth) with a mesh bag for cleaning. The splint cover (blue cloth) should be washed with soapy water and hung out to dry or washed on delicate in home washer. Do not throw away splint cover it is washable. Please have a set location to store the splints in patients room for daily application.    Splints are to be worn for 4 hours and off for 4 hours  Examples of schedule:  Splints off at 4am Splints on at 8 am Splints off at 12 pm Splints on at 4 pm Splints off at 8pm Splints on at 12 Am    To place the splint on:  1. Place the wrist in position first and secure strap 2. Position each digit and apply strap 3. The thumb and forearm strap should be applied last   The splints should fit as appeared here Strap over the PIP joint of finger Strap over the MCP ( knuckles)  joints of the hand Strap at the thumb Strap at the wrist Strap at the forearm   Fleeta Emmer, OTR/L  Acute Rehabilitation Services Pager: [phone] Office: [phone] .

## 2021-05-16 NOTE — Progress Notes (Addendum)
STROKE TEAM PROGRESS NOTE   INTERVAL HISTORY She presented with altered mental status with left hemiparesis and was initially thought to have a right basal ganglia infarct however CT angiogram and CT perfusion showed a right MCA M1 occlusion with a core of 50 cc and recall 50 mm of penumbra.  She underwent successful TICI 2b near complete revascularization of the right MCA but parietal branch showed subarachnoid hemorrhage and that branch had to be sacrificed with vessel coiling.  Follow-up CT scan this morning shows stable appearance of right parietal subarachnoid hemorrhage but evolving large right basal ganglia infarct with 3 mm right-to-left shift.  Blood pressure adequately controlled.  Patient has been extubated but she is wearing a cervical collar because of retropharyngeal fluid her MRI scan cervical spine does not show any spinal injury or cord pathology.  Vitals:   05/16/21 0800 05/16/21 0815 05/16/21 0830 05/16/21 0845  BP: (!) 143/76 (!) 143/77 140/69 136/69  Pulse: 95 93 92 91  Resp: (!) 21 18 17 16   Temp: 99.7 F (37.6 C)     TempSrc: Axillary     SpO2: 97% 99% 98% 98%  Weight:      Height:       CBC:  Recent Labs  Lab 05/15/21 0430 05/15/21 1221  WBC 10.2  --   NEUTROABS 8.7*  --   HGB 8.0* 7.8*  HCT 27.4* 26.5*  MCV 70.8*  --   PLT 356  --    Basic Metabolic Panel:  Recent Labs  Lab 05/15/21 0430  NA 140  K 3.3*  CL 108  CO2 21*  GLUCOSE 114*  BUN 17  CREATININE 1.27*  CALCIUM 9.3   Lipid Panel:  Recent Labs  Lab 05/16/21 0457  CHOL 131  TRIG 178*  HDL 19*  CHOLHDL 6.9  VLDL 36  LDLCALC 76   HgbA1c: No results for input(s): HGBA1C in the last 168 hours. Urine Drug Screen:  Recent Labs  Lab 05/15/21 0445  LABOPIA NONE DETECTED  COCAINSCRNUR NONE DETECTED  LABBENZ NONE DETECTED  AMPHETMU NONE DETECTED  THCU NONE DETECTED  LABBARB NONE DETECTED    Alcohol Level  Recent Labs  Lab 05/15/21 0438  ETH <10    IMAGING  CT HEAD WO  CONTRAST Result Date: 05/16/2021  IMPRESSION:  1. Continued interval evolution of moderately large right MCA distribution infarct. Associated regional mass effect has mildly increased, with new 2 mm of right-to-left midline shift. No hydrocephalus or trapping.  2. Decreased prominence of subarachnoid hemorrhage at the posterior right Sylvian fissure and overlying right frontoparietal region. Trace intraventricular hemorrhage related to redistribution.  3. Question new area of parenchymal hypodensity involving the lateral left cerebellum. While this finding is favored to be artifactual, a possible new acute ischemic infarct is difficult to exclude, and could be considered in the correct clinical setting. Attention at follow-up recommended.  4. No other new acute intracranial abnormality.   CT HEAD WO CONTRAST Result Date: 05/15/2021  IMPRESSION: Small-to-moderate volume hyperdensity within the right sylvian fissure and along portions of the right frontoparietal and temporal lobes compatible with extravasated contrast/subarachnoid hemorrhage. There is hyperdensity within the right basal ganglia, right insula, lateral right frontal lobe/frontal operculum and within portions of the right parietal lobe likely reflecting contrast staining at sites of acute infarction. Mild associated mass effect with subtle partial effacement of the right lateral ventricle. No midline shift or hydrocephalus. Embolization material now overlies the lateral right parietal lobe. Stable background parenchymal atrophy and chronic  small vessel ischemic disease. Redemonstrated chronic infarct within the superior left cerebellum.   MR BRAIN WO CONTRAST Result Date: 05/15/2021 IMPRESSION:  1. Moderately large acute right MCA infarct. Associated petechial hemorrhage, subarachnoid hemorrhage, and minimal intraventricular hemorrhage.  2. Severe chronic small vessel ischemic disease with multiple chronic infarcts.   MR CERVICAL SPINE WO  CONTRAST Result Date: 05/15/2021  IMPRESSION: The cord itself appears normal. No disc disease. No spinal stenosis. 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 No significant encroachment upon the neural spaces. Some prevertebral soft tissue edema. In this clinical scenario, this may be subsequent to previous endotracheal intubation. One could not rule out pharyngitis or cellulitis but that seems less likely. I do not see any traumatic injury to the cervical spine itself. Electronically Signed   By: Nelson Chimes M.D.   On: 05/15/2021 16:05   IR CT Head Ltd Result Date: 05/15/2021 CT of the head was obtained and post processed in a separate workstation with concurrent attending physician supervision. Selected images were sent to PACS. Small right sylvian contrast extravasation confirmed. Additionally, hyperdensity of the right basal ganglia and lateral right frontal cortex are noted, related to ongoing ischemia. Right internal carotid artery angiograms with frontal and lateral views of the head showed occlusion of the embolized branch with no evidence of contrast extravasation. Patency of the remaining MCA branches noted.  IR PERCUTANEOUS ART THROMBECTOMY/INFUSION INTRACRANIAL INC DIAG ANGIO Result Date: 05/15/2021 IMPRESSION: Mechanical thrombectomy performed for treatment of right M1/MCA occlusion with clot fragmentation and embolization to same distal territory requiring multiple additional passes. Procedure complicated by contrast extravasation at a distal right M3/MCA branch requiring vessel embolization. Near complete recanalization of the right MCA vascular tree was achieved (TICI 2b). PLAN: - Bed rest post femoral access x6h - Follow-up CT in 4 hours to evaluate for SAH/contrast extravasation stability - Transfer to ICU- SBP: 120-140 mmHg   Echo Result read: 05/16/2021 1. Left ventricular ejection fraction, by estimation, is 55 to 60%. The  left ventricle has normal function. The left  ventricle has no regional  wall motion abnormalities. There is moderate concentric left ventricular  hypertrophy. Left ventricular  diastolic parameters are consistent with Grade II diastolic dysfunction  (pseudonormalization). Elevated left atrial pressure.  2. Right ventricular systolic function is normal. The right ventricular  size is normal. Mildly increased right ventricular wall thickness. There  is moderately elevated pulmonary artery systolic pressure. The estimated  right ventricular systolic pressure  is 19.5 mmHg.  3. Left atrial size was severely dilated.  4. Right atrial size was severely dilated.  5. There is no evidence of cardiac tamponade.  6. The mitral valve is normal in structure. Trivial mitral valve  regurgitation. No evidence of mitral stenosis.  7. The aortic valve is normal in structure. There is mild calcification  of the aortic valve. There is mild thickening of the aortic valve. Aortic  valve regurgitation is not visualized. Mild aortic valve sclerosis is  present, with no evidence of aortic  valve stenosis.  8. The inferior vena cava is dilated in size with <50% respiratory  variability, suggesting right atrial pressure of 15 mmHg.   PHYSICAL EXAM General:  Pleasant middle-aged African-American lady laying comfortably in bed; in no acute distress.  She is wearing a cervical collar HENT: Normal oropharynx and mucosa. Normal external appearance of ears and nose. Neck: Supple, no pain or tenderness CV: No JVD. No peripheral edema. Pulmonary: Symmetric Chest rise. Normal respiratory effort. Abdomen: Soft  to touch, non-tender. Ext: No cyanosis, edema, or deformity Skin: No rash. Normal palpation of skin.  Musculoskeletal: Normal digits and nails by inspection. No clubbing.  Mental status/Cognition: Somnolent, eyes closed. Opens eyes to voice. Does not answer orientation questions.  Forced right gaze deviation.  Profound left  hemineglect. Speech/language: Non fluent, comprehension intact to simple commands. Unable to repeat or name objects.  Mild dysarthria Cranial Nerves: II: Unable to assess Visual Fields . Pupils are equal, round, and reactive to light.  III,IV, VI: Right gaze deviation unable to cross midline V: unable to assess facial sensation  VII:She has mild right lower facial weakness VIII: hearing appears to be intact to voice X: unable to assess uvula XI: does not shrug shoulder. XII: does not stick out tongue Motor: She has 3/5 weakness on the right upper and lower extremities (drift) and flaccid left arm and leg with only trace withdrawal to pain Sensory: Unable to assess, neglects left side   ASSESSMENT/PLAN Ms. Uniqua Kihn is a 45 y.o. female with history of uncontrolled HTN, tobacco use disorder, uterine fibroids, heart failure, paroxysmal SVT, paranoid psychosis who presents with altered mental status after being found down.  CT head initially read as small acute appearing perforator infarct in the R BG and notable for severe small vessel ischemia. However concerning for potential R MCA stroke and thus CTA head and neck and CTP was obtained with a R MCA M1 occlusion and a core of 50cc and 61ml of penumbra.   Stroke:  right MCA stroke in the setting of right M1 occlusion s/p mechanical thrombectomy with TICI 2 b revascularization Procedure complicated by contrast extravasation at a distal right M3/MCA branch requiring vessel embolization to control SAH.  Clot fragmentation into posterior MS branches s/p aspiration and stent retrieval.  Recanalization of M2 branches achieved with clot fragmentation to distal parietal M3 branch. Two passes were then performed with direct contact aspiration with microcather. Follow up angiogram showed recanalization with contrast extravasation. Therefore, M3 branch was treated with coil embolization.   CT head: Acute appearing perforator infarct at the right  basal ganglia.   CTA neck: large vessel occlusion at the right M1 origin. Left vertebral occlusion at its origin. High-grade narrowings of the right cavernous ICA.  CT perfusion suggestive 50 cc core infarct and 50 cc of adjacent penumbra.  Repeat CT head (5/25): interval evolution of moderately large right MCA distribution infarct. Associated regional mass effect has mildly increased, with new 2 mm of right-to-left midline shift. New parenchymal hypodensity involving the lateral left cerebellum  MRI: large acute right MCA infarct. Associated petechial hemorrhage, subarachnoid hemorrhage, and minimal intraventricular hemorrhage.  Flat panel head CT: small contrast extravasation in the distal right Sylvian fissure and contrast staining of basal ganglia and right frontal cortex, related to ongoing ischemia.  2D Echo 55 to 60%,: moderate concentric left ventricular  hypertrophy  UDS: negative  LDL 76  HgbA1c No results found for requested labs within last 26280 hours.  VTE prophylaxis - SCDs    Diet   Diet NPO time specified     No antithrombotic prior to admission  Therapy recommendations:  CIR  Disposition:  Pending  Cerebral edema  Increase mass effect with new 56mm of right to left midline shift  Start Hypertonic saline solution 46ml/hr  Na goal 150-160  Check sodium every 6 hours  Dysphagia  Likely due to stroke  SLP following  cortrak placement  Nutrition consult for tube feed recs  Concern for  Cervical spine injury:  CT: concern for retropharyngeal fluid and subcutaneous/intramuscular edema in left neck.  MRI cervical spine negative for fracture  Remove c collar  Paroxysmal SVT history  Amiodarone 200mg  daily  Hypertension  Home meds:  Hydralazine 100mg  TID, Imdur 60mg  daily, Toprol XL 200mg  daily  Stable  Change SBP goal to <160 mmHg . Long-term BP goal normotensive  Hyperlipidemia  Home meds:  none  LDL 76, goal < 70  Add  atorvastatin 20mg  daily  Continue statin at discharge  Diabetes type II Controlled  Home meds:  none  HgbA1c pending, goal < 7.0  CBGs  SSI  Other Stroke Risk Factors  Cigarette smoker advised to stop smoking  Coronary artery disease  Other Active Problems  Cytotoxic cerebral edema  Hospital day # 1  Lissy Olivencia-Simmons, ACNP-BC Stroke NP I have personally obtained history,examined this patient, reviewed notes, independently viewed imaging studies, participated in medical decision making and plan of care.ROS completed by me personally and pertinent positives fully documented  I have made any additions or clarifications directly to the above note. Agree with note above.  She presented with right M1 occlusion and underwent mechanical thrombectomy which was complicated by contrast extravasation requiring branch vessel embolization.  Follow-up CT scan showed moderate size right MCA infarct with stable subarachnoid hemorrhage.  Continue gross neurological monitoring and strict blood pressure control with systolic goal between 456-256 for 24 hours and then below 160.  Discontinue cervical collar.  Start hypertonic saline through peripheral IV at 75 cc an hour with serum sodium goal between 150-155 to control cytotoxic edema speech therapy for swallow eval.  May need core track tube for feedings and medications.  Long discussion with patient bedside and I also spoke to her brother [name] over the phone and updated her about her condition and answered questions. This patient is critically ill and at significant risk of neurological worsening, death and care requires constant monitoring of vital signs, hemodynamics,respiratory and cardiac monitoring, extensive review of multiple databases, frequent neurological assessment, discussion with family, other specialists and medical decision making of high complexity.I have made any additions or clarifications directly to the above note.This critical  care time does not reflect procedure time, or teaching time or supervisory time of PA/NP/Med Resident etc but could involve care discussion time.  I spent 30 minutes of neurocritical care time  in the care of  this patient.     Antony Contras, MD Medical Director [facility] Pager: [phone] 05/16/2021 4:14 PM  To contact Stroke Continuity provider, please refer to [email or web address] After hours, contact General Neurology

## 2021-05-16 NOTE — Progress Notes (Signed)
Referring Physician(s): Code stroke - Donnetta Simpers (neuro)  Supervising Physician: Pedro Earls  Patient Status:  [facility] - In-pt  Chief Complaint: Follow up mechanical thrombectomy of right MCA M1/M2/M3 and embolization of right MCA M3 branch due to contrast extravasation on 5/24 with Dr Tennis Must Sindy Messing  Subjective:  Patient just finished getting cortrak placed, agitated during placement and then lethargic afterwards. Will follow commands to squeeze hands/lift legs but will not open eyes or follow other commands. Per RN arterial line removed, on cleviprex again, bilateral orbits and sclera are swollen, c-collar just removed this morning. Also noted abdominal distention and vaginal bleeding. RN states she was able to hold RLE up for 5 seconds earlier today.  Allergies: Patient has no known allergies.  Medications: Prior to Admission medications   Medication Sig Start Date End Date Taking? Authorizing Provider  amiodarone (PACERONE) 200 MG tablet Take 200 mg by mouth daily. 02/02/21   [provider]  ENTRESTO 97-103 MG Take 1 tablet by mouth 2 (two) times daily. 02/20/21   [provider]  FEROSUL 325 (65 Fe) MG tablet Take 325 mg by mouth 2 (two) times daily. 02/20/21   [provider]  hydrALAZINE (APRESOLINE) 100 MG tablet Take 100 mg by mouth 3 (three) times daily. 02/12/21   [provider]  isosorbide mononitrate (IMDUR) 60 MG 24 hr tablet Take 60 mg by mouth daily. 02/02/21   [provider]  KLOR-CON M20 20 MEQ tablet Take 40 mEq by mouth 2 (two) times daily. 02/12/21   [provider]  megestrol (MEGACE) 20 MG tablet Take 40 mg by mouth in the morning and at bedtime. May increase to 4 tablets ( 80 mg) twice daily in the event of heavy breathing 03/08/21   [provider]  metoprolol (TOPROL-XL) 200 MG 24 hr tablet Take 200 mg by mouth daily. 02/02/21   [provider]  torsemide (DEMADEX) 20  MG tablet Take 40 mg by mouth 2 (two) times daily. 02/12/21   [provider]     Vital Signs: BP 134/80   Pulse 91   Temp 99.7 F (37.6 C) (Axillary)   Resp (!) 25   Ht _0  (1.651 m)   Wt 160 lb (72.6 kg)   LMP  (LMP Unknown)   SpO2 96%   BMI 26.63 kg/m   Physical Exam Vitals and nursing note reviewed.  Constitutional:      Appearance: She is ill-appearing.  HENT:     Head: Normocephalic.  Eyes:     Comments: (+) bilateral periorbital edema (+) bilateral scleral edema without hemorrhage  Cardiovascular:     Rate and Rhythm: Normal rate and regular rhythm.     Comments: (+) right CFA clean, dry, dressed appropriately - dressing removed by Dr Tennis Must Sindy Messing today. Soft, non pulsatile, no active bleeding/drainage, palpation does not appear to illicit pain response. Pulmonary:     Effort: Pulmonary effort is normal.     Breath sounds: Normal breath sounds.  Abdominal:     General: There is distension.     Palpations: There is mass.     Comments: (+) cortrak in place  Skin:    General: Skin is warm and dry.  Neurological:     Mental Status: She is alert.   Lethargic, follows simple commands (hand squeeze, move right leg slightly) but does not follow other commands Speech - unable to assess Comprehension - able to follow basic commands but does  not follow more complex commands EOMs without nystagmus  Visual fields - unable to assess No obvious facial asymmetry. Tongue midline - unable to assess Motor power - minimal L hand squeeze/unable to lift arm (difficulty following commands), strong R hand squeeze/unable to lift arm (difficulty following commands), does not move LLE, minimal movement of RLE (per RN was able to hold up for 5 seconds earlier but is tired from Cortrak placement) Fine motor and coordination - unable to assess Gait - not assessed Romberg - not assessed Heel to toe - not assessed Distal pulses palpable bilaterally   Imaging: CT HEAD  WO CONTRAST  Result Date: 05/16/2021 CLINICAL DATA:  Follow-up examination for acute stroke. EXAM: CT HEAD WITHOUT CONTRAST TECHNIQUE: Contiguous axial images were obtained from the base of the skull through the vertex without intravenous contrast. COMPARISON:  Prior CT and MRI from 05/15/2021. FINDINGS: Brain: Examination degraded by motion artifact and patient positioning. There has been continued interval evolution of moderately large right MCA distribution infarct, grossly stable in size and distribution from recent MRI. Scattered areas of contrast staining within the area of infarction has markedly decreased and nearly resolved. Persistent subarachnoid hemorrhage at the posterior right sylvian fissure and overlying right frontoparietal region, decreased in prominence from previous, likely reflecting decreased contrast staining with some degree of resorption of blood. Streak artifact from superimposed embolization material at the peripheral right frontoparietal region again noted. Trace intraventricular hemorrhage related to redistribution again noted. There is increased regional mass effect with sulcal effacement within the area of infarction, with mildly increased mass effect and effacement of the right lateral ventricle. 2 mm of right-to-left shift is now evident. Basilar cisterns remain patent. No hydrocephalus or trapping. There is an apparent new area of parenchymal hypodensity involving the lateral left cerebellum (series 3, image 8). While this finding is favored to be artifactual, a possible new acute ischemic infarct is difficult to exclude, and could be considered in the correct clinical setting. Area of involvement measures approximately 2.4 cm. No other acute intracranial abnormality. Chronic left cerebellar infarct again noted. Underlying atrophy with advanced chronic microvascular ischemic disease with multiple additional remote lacunar infarcts noted as well. No extra-axial collection.  Vascular: No visible new or recurrent hyperdense vessel. Calcified atherosclerosis present at the skull base. Skull: No new scalp soft tissue abnormality.  She calvarium intact. Sinuses/Orbits: Globes and orbital soft tissues demonstrate no new obvious abnormality, although evaluation limited by motion. Paranasal sinuses remain clear. No mastoid effusion. Other: None. IMPRESSION: 1. Continued interval evolution of moderately large right MCA distribution infarct. Associated regional mass effect has mildly increased, with new 2 mm of right-to-left midline shift. No hydrocephalus or trapping. 2. Decreased prominence of subarachnoid hemorrhage at the posterior right Sylvian fissure and overlying right frontoparietal region. Trace intraventricular hemorrhage related to redistribution. 3. Question new area of parenchymal hypodensity involving the lateral left cerebellum. While this finding is favored to be artifactual, a possible new acute ischemic infarct is difficult to exclude, and could be considered in the correct clinical setting. Attention at follow-up recommended. 4. No other new acute intracranial abnormality. Electronically Signed   By: Jeannine Boga M.D.   On: 05/16/2021 02:55   CT HEAD WO CONTRAST  Addendum Date: 05/15/2021   ADDENDUM REPORT: 05/15/2021 17:13 ADDENDUM: These results were called by telephone at the time of interpretation on 05/15/2021 at 3:05 pm to provider Dr. Leonel Ramsay, who verbally acknowledged these results. Electronically Signed   By: Kellie Simmering DO   On:  05/15/2021 17:13   Result Date: 05/15/2021 CLINICAL DATA:  Stroke, follow-up; small contrast extravasates post MT. Contrast staining basal ganglia and frontal cortex. EXAM: CT HEAD WITHOUT CONTRAST TECHNIQUE: Contiguous axial images were obtained from the base of the skull through the vertex without intravenous contrast. COMPARISON:  Noncontrast head CT, CT angiogram head/neck and CT perfusion performed earlier today.  Procedure report from endovascular revascularization and embolization performed earlier today 05/15/2021. FINDINGS: Brain: Mild cerebral and cerebellar atrophy. Small to moderate volume hyperdensity within the right sylvian fissure and along portions of the right frontoparietal and temporal lobes compatible with extravasated contrast/subarachnoid hemorrhage. There is hyperdensity within the right basal ganglia, right insula, lateral right frontal lobe/frontal operculum and within portions of the right parietal lobe likely reflecting contrast staining at sites of acute infarction. Associated mass effect with subtle partial effacement of the right lateral ventricle. No midline shift at this time. No evidence of hydrocephalus. Background patchy and ill-defined hypoattenuation within the cerebral white matter, nonspecific but compatible with chronic small vessel ischemic disease. Redemonstrated chronic lacunar infarcts within the left basal ganglia. Redemonstrated chronic infarct within the superior left cerebellar hemisphere. Vascular: Residual circulating contrast limits evaluation for hyperdense vessels. Atherosclerotic calcifications. Embolization material is now present overlying the lateral right parietal lobe, consistent with the procedural history describing embolization of a right M3 branch. Skull: Normal. Negative for fracture or focal lesion. Sinuses/Orbits: Visualized orbits show no acute finding. Trace bilateral ethmoid sinus mucosal thickening. Other: Redemonstrated left maxillofacial soft tissue swelling. IMPRESSION: Small-to-moderate volume hyperdensity within the right sylvian fissure and along portions of the right frontoparietal and temporal lobes compatible with extravasated contrast/subarachnoid hemorrhage. There is hyperdensity within the right basal ganglia, right insula, lateral right frontal lobe/frontal operculum and within portions of the right parietal lobe likely reflecting contrast staining  at sites of acute infarction. Mild associated mass effect with subtle partial effacement of the right lateral ventricle. No midline shift or hydrocephalus. Embolization material now overlies the lateral right parietal lobe. Stable background parenchymal atrophy and chronic small vessel ischemic disease. Redemonstrated chronic infarct within the superior left cerebellum. Electronically Signed: By: Kellie Simmering DO On: 05/15/2021 15:05   CT Head Wo Contrast  Result Date: 05/15/2021 CLINICAL DATA:  Mental status change with unknown cause.  Found down EXAM: CT HEAD WITHOUT CONTRAST TECHNIQUE: Contiguous axial images were obtained from the base of the skull through the vertex without intravenous contrast. COMPARISON:  01/16/2021 brain MRI FINDINGS: Brain: New band of low-density at the right caudate, anterior putamen, and intervening white matter. Extensive chronic small vessel ischemia in the cerebral white matter with confluent gliosis and chronic lacunar infarcts. Small chronic left superior cerebellar infarct. No hemorrhage, hydrocephalus, or masslike finding. Vascular: No hyperdense vessel Skull: Negative Sinuses/Orbits: Negative IMPRESSION: 1. Acute appearing perforator infarct at the right basal ganglia. 2. Severe chronic small vessel ischemia. Electronically Signed   By: Monte Fantasia M.D.   On: 05/15/2021 05:15   CT Angio Neck W and/or Wo Contrast  Result Date: 05/15/2021 CLINICAL DATA:  Code stroke. EXAM: CT ANGIOGRAPHY HEAD AND NECK CT PERFUSION BRAIN TECHNIQUE: Multidetector CT imaging of the head and neck was performed using the standard protocol during bolus administration of intravenous contrast. Multiplanar CT image reconstructions and MIPs were obtained to evaluate the vascular anatomy. Carotid stenosis measurements (when applicable) are obtained utilizing NASCET criteria, using the distal internal carotid diameter as the denominator. Multiphase CT imaging of the brain was performed following IV  bolus contrast injection. Subsequent parametric  perfusion maps were calculated using RAPID software. CONTRAST:  50m OMNIPAQUE IOHEXOL 350 MG/ML SOLN COMPARISON:  Head CT from earlier today FINDINGS: CTA NECK FINDINGS Aortic arch: Motion degraded.  No acute finding Right carotid system: Mild mixed density plaque at the bifurcation. No stenosis or ulceration. Left carotid system: Mixed density plaque at the bifurcation. No stenosis or ulceration. Vertebral arteries: No proximal subclavian stenosis. Low-density bulge into the left subclavian lumen at the expected location of the left vertebral origin, with left vertebral occlusion. There is V4 segment reconstitution. No flow limiting stenosis or dissection seen on the right. Skeleton: Retropharyngeal fluid. There is contusion like appearance of the left cheek and the left lateral neck. Muscles on the left lateral neck may be diffusely edematous and mildly swollen. Other neck: Subcentimeter nodules in the thyroid. No followup recommended (ref: J Am Coll Radiol. 2015 Feb;12(2): 143-50). Upper chest: Left supraclavicular stranding which is contiguous with the left neck edema. No evidence of air leak or contusion. Review of the MIP images confirms the above findings CTA HEAD FINDINGS Anterior circulation: Right M1 occlusion with abrupt cut off. Heavily calcified carotid siphons which limits detection of luminal stenosis. The right ICA is more heavily affected with high-grade narrowing possible at the petrous/cavernous junction and anterior genu. Hypoplastic right A1 segment. Atheromatous irregularity of MCA and ACA branches. Posterior circulation: Reconstituted left V4. Atheromatous plaque at the bilateral V4 segments. The basilar is smooth and widely patent. No PCA branch occlusion or aneurysm. Venous sinuses: Right dominant transverse sigmoid system. No acute thrombosis. Anatomic variants: As above Review of the MIP images confirms the above findings CT Brain Perfusion  Findings: ASPECTS: 7 - 8 ( caudate and putamen infarct. There could be anterior insular infarct on the right, but similar to the left and indeterminate due to the degree of chronic changes). CBF (<30%) Volume: 568mPerfusion (Tmax>6.0s) volume: 10326mismatch Volume: 53m6mfarction Location:Lateral right frontal lobe and right basal ganglia Case discussed with Dr. KirkLeonel Ramsayle in progress. IMPRESSION: 1. Emergent large vessel occlusion at the right M1 origin. CT perfusion suggestive 50 cc core infarct and 50 cc of adjacent penumbra. 2. Left vertebral occlusion at its origin (where there is subclavian luminal density from thrombus or plaque) with reconstitution by the V4 segment. 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 3. Retropharyngeal fluid and subcutaneous/intramuscular edema in the left neck. Given the history this may be from traumatic soft tissue injury or pressure necrosis. 4. Premature atherosclerosis and chronic small vessel disease. High-grade narrowings of the right cavernous ICA which may be accentuated by underfilling from downstream occlusion and right A1 hypoplasia. Electronically Signed   By: JonaMonte Fantasia.   On: 05/15/2021 07:49   MR BRAIN WO CONTRAST  Result Date: 05/15/2021 CLINICAL DATA:  Stroke follow-up. EXAM: MRI HEAD WITHOUT CONTRAST TECHNIQUE: Multiplanar, multiecho pulse sequences of the brain and surrounding structures were obtained without intravenous contrast. COMPARISON:  Head CT 05/15/2021 and MRI 01/16/2021 FINDINGS: Brain: There is a moderately large acute right MCA territory infarct involving essentially the entirety of the basal ganglia, much of the insula, and portions of cortex and white matter in the temporal, frontal, and parietal lobes. There is associated petechial hemorrhage, and there is cytotoxic edema with regional sulcal effacement, partial effacement of the body and frontal horn of the right  lateral ventricle, and trace leftward midline shift. As seen on the prior CT, there is also subarachnoid hemorrhage in the right sylvian  fissure and right multiple cerebral sulci in the regions of infarct. A small amount of intraventricular hemorrhage is also noted in the occipital horns of the lateral ventricles and in the fourth ventricle. A background of confluent T2 hyperintensity is again seen in the cerebral white matter bilaterally, nonspecific but compatible with severe chronic small vessel ischemic disease. There are small chronic infarcts in the right corona radiata, bilateral basal ganglia, pons, and cerebellum. Scattered chronic microhemorrhages are noted in the cerebrum and cerebellum. There is mild cerebral atrophy. There is no extra-axial fluid collection. Vascular: Major intracranial vascular flow voids are preserved. Skull and upper cervical spine: Chronically diminished bone marrow T1 signal intensity diffusely likely related to chronic anemia. Sinuses/Orbits: Chronic bilateral proptosis. Paranasal sinuses and mastoid air cells are clear. Other: None. IMPRESSION: 1. Moderately large acute right MCA infarct. Associated petechial hemorrhage, subarachnoid hemorrhage, and minimal intraventricular hemorrhage. 2. Severe chronic small vessel ischemic disease with multiple chronic infarcts. Electronically Signed   By: Logan Bores M.D.   On: 05/15/2021 16:12   MR CERVICAL SPINE WO CONTRAST  Result Date: 05/15/2021 CLINICAL DATA:  Acute or progressive myelopathy. Previous intervention for right M1 occlusion. EXAM: MRI CERVICAL SPINE WITHOUT CONTRAST TECHNIQUE: Multiplanar, multisequence MR imaging of the cervical spine was performed. No intravenous contrast was administered. COMPARISON:  None. FINDINGS: The study suffers from considerable motion degradation. Alignment: Normal Vertebrae: Normal Cord: The cord appears normal. No cord compression. No cord swelling or discernible abnormal T2 signal.  Posterior Fossa, vertebral arteries, paraspinal tissues: Some edema in the prevertebral soft tissues. Question if this could be related to recent endotracheal intubation. One could not rule out the possibility of soft tissue neck infection. Disc levels: Somewhat limited detail because of motion. There does not appear to be any disc space pathology. Patient does have facet arthritis most notable on the right at C2-3, C3-4, C4-5 and C7-T1 and on the left at C7-T1. There does not appear to be pronounced encroachment upon the neural foramina. IMPRESSION: The cord itself appears normal. No disc disease. No spinal stenosis. 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 No significant encroachment upon the neural spaces. Some prevertebral soft tissue edema. In this clinical scenario, this may be subsequent to previous endotracheal intubation. One could not rule out pharyngitis or cellulitis but that seems less likely. I do not see any traumatic injury to the cervical spine itself. Electronically Signed   By: Nelson Chimes M.D.   On: 05/15/2021 16:05   IR Angiogram Selective Each Additional Vessel  Result Date: 05/15/2021 INDICATION: 45 year old female with past medical history significant for uncontrolled hypertension, tobacco use disorder, uterine fibroids, heart failure, paroxysmal SVT, paranoid psychosis presenting with altered mental status. Head CT showed hypodensity within the right basal ganglia concerning for possible right MCA occlusion. A CT angiogram of the head and neck was then obtained and showed an occlusion of the right M1/MCA segment. CT perfusion showed a core infarct of 51 mL with a penumbra of 52 mL. Case was discussed with patient's brother and decision was made to proceed with mechanical thrombectomy. EXAM: DIAGNOSTIC CEREBRAL ANGIOGRAM MECHANICAL THROMBECTOMY FLAT PANEL HEAD CT COMPARISON:  CT/CT angiogram of the head and neck May 15, 2021. MEDICATIONS: 5 mg of verapamil intra arterial.  ANESTHESIA/SEDATION: Procedure was performed under general anesthesia. CONTRAST:  75 mL of Omnipaque 240 milligram/mL FLUOROSCOPY TIME:  Fluoroscopy Time: 21 minutes 24 seconds (1090 mGy). COMPLICATIONS: SIR LEVEL C - Requires therapy, minor hospitalization (<48 hrs). TECHNIQUE: Informed  written consent was obtained from the patient's brother after a thorough discussion of the procedural risks, benefits and alternatives. All questions were addressed. Maximal Sterile Barrier Technique was utilized including caps, mask, sterile gowns, sterile gloves, sterile drape, hand hygiene and skin antiseptic. A timeout was performed prior to the initiation of the procedure. The right groin was prepped and draped in the usual sterile fashion. Using a micropuncture kit and the modified Seldinger technique, access was gained to the right common femoral artery and an 8 French sheath was placed. Under fluoroscopy, an 8 [place] Walrus balloon guide catheter was navigated over a 6 [place] Berenstein 2 catheter and a 0.035" Terumo Glidewire into the aortic arch. The catheter was placed into the right common carotid artery and then advanced into the right internal carotid artery. The inner catheter was removed. Frontal and lateral angiograms of the head were obtained. FINDINGS: 1. There is a mid right M1/MCA occlusion. 2. Atherosclerotic changes of the right carotid bifurcation without hemodynamically significant stenosis. PROCEDURE: Under biplane roadmap, a zoom 71 aspiration catheter was navigated over a Aristotle 18 microguidewire into the cavernous segment of the right ICA. The aspiration catheter was advanced to the level of occlusion at the right M1 segment and connected to a penumbra aspiration pump. The guiding catheter balloon was inflated. The aspiration catheter was removed under constant aspiration. Right internal carotid artery angiograms showed recanalization of the right M1 segment with occlusion of a distal M2/MCA posterior  division branch. Under biplane roadmap, a zoom 71 aspiration catheter was navigated over a Aristotle 18 microguidewire into the right M1/MCA. The aspiration catheter was advanced over the wire to the level of occlusion at the right M2 segment and connected to a penumbra aspiration pump. The guiding catheter balloon was inflated. The aspiration catheter was removed under constant aspiration. Right internal carotid artery angiograms showed recanalization of the right M2 segment with occlusion of M3/MCA posterior division branches. Under biplane roadmap, a zoom 71 aspiration catheter was navigated over a phenom 21 microcatheter and a Aristotle 18 microguidewire into the cavernous segment of the right ICA. The microcatheter was then navigated over the wire into the right M3/MCA. Then, a 3 mm solitaire stent retriever was deployed spanning the M2-M3 segment. The device was allowed to intercalated with the clot for 4 minutes. The microcatheter was removed. The aspiration catheter was advanced to the level of occlusion and connected to a penumbra aspiration pump. The guiding catheter balloon was inflated. The thrombectomy device and aspiration catheter were removed under constant aspiration. Right internal carotid artery angiograms showed recanalization of the proximal right M3 segment with occlusion of distal M3/MCA posterior division branches. Under biplane roadmap, a zoom 71 aspiration catheter was navigated over a phenom 21 microcatheter and a Aristotle 18 microguidewire into the cavernous segment of the right ICA. The microcatheter was then navigated over the wire into the right M3/MCA. Then, the microcatheter was connected to a penumbra aspiration pump. The guiding catheter balloon was inflated. The microcatheter was removed under constant aspiration. Right internal carotid artery angiograms showed persistent occlusion of a distal M3/MCA posterior division branch. Under biplane roadmap, a zoom 71 aspiration catheter  was navigated over a phenom 21 microcatheter and a Aristotle 18 microguidewire into the cavernous segment of the right ICA. The microcatheter was then navigated over the wire into the right M3/MCA. Then, the microcatheter was connected to a penumbra aspiration pump. The guiding catheter balloon was inflated. The microcatheter was removed under constant aspiration. Right internal carotid  artery angiograms showed recanalization of the distal right M3 segment with occlusion of and M4 segment. Contrast extravasation was seen at the distal M3 level. Under biplane roadmap, a zoom 71 aspiration catheter was navigated over a phenom 21 microcatheter and a Aristotle 18 microguidewire into the cavernous segment of the right ICA. The microcatheter was then navigated over the wire into the right M3/MCA. Then, a 2 x 6 mm microplex hypersoft coil was deployed in the distal right M3/MCA segment. Right internal carotid artery angiograms showed slow flow in the M3 branch with no evidence of extravasation. The guide catheter was retracted into the right common carotid artery. Frontal and lateral angiograms of the neck were obtained. Atherosclerotic changes noted in the right carotid bifurcation without hemodynamically significant stenosis or evidence of new clot formation. Flat panel CT of the head was obtained and post processed in a separate workstation with concurrent attending physician supervision. Selected images were sent to PACS. Small right sylvian contrast extravasation confirmed. Additionally, hyperdensity of the right basal ganglia and lateral right frontal cortex are noted, related to ongoing ischemia. Right internal carotid artery angiograms with frontal and lateral views of the head showed occlusion of the embolized branch with no evidence of contrast extravasation. Patency of the remaining MCA branches noted. The catheter was subsequently withdrawn. Right common femoral artery angiograms were obtained in right anterior  oblique and lateral views. The puncture is at the level of the mid common femoral artery which has adequate caliber for closure device utilization. The femoral sheath was exchanged for a Perclose pro style which was utilized for access closure. Immediate hemostasis was achieved. IMPRESSION: Mechanical thrombectomy performed for treatment of right M1/MCA occlusion with clot fragmentation and embolization to same distal territory requiring multiple additional passes. Procedure complicated by contrast extravasation at a distal right M3/MCA branch requiring vessel embolization. Near complete recanalization of the right MCA vascular tree was achieved (TICI 2b). PLAN: - Bed rest post femoral access x6h - Follow-up CT in 4 hours to evaluate for SAH/contrast extravasation stability - Transfer to ICU- SBP: 120-140 mmHg Electronically Signed   By: Pedro Earls M.D.   On: 05/15/2021 16:21   IR CT Head Ltd  Result Date: 05/15/2021 INDICATION: 45 year old female with past medical history significant for uncontrolled hypertension, tobacco use disorder, uterine fibroids, heart failure, paroxysmal SVT, paranoid psychosis presenting with altered mental status. Head CT showed hypodensity within the right basal ganglia concerning for possible right MCA occlusion. A CT angiogram of the head and neck was then obtained and showed an occlusion of the right M1/MCA segment. CT perfusion showed a core infarct of 51 mL with a penumbra of 52 mL. Case was discussed with patient's brother and decision was made to proceed with mechanical thrombectomy. EXAM: DIAGNOSTIC CEREBRAL ANGIOGRAM MECHANICAL THROMBECTOMY FLAT PANEL HEAD CT COMPARISON:  CT/CT angiogram of the head and neck May 15, 2021. MEDICATIONS: 5 mg of verapamil intra arterial. ANESTHESIA/SEDATION: Procedure was performed under general anesthesia. CONTRAST:  75 mL of Omnipaque 240 milligram/mL FLUOROSCOPY TIME:  Fluoroscopy Time: 21 minutes 24 seconds (1090 mGy).  COMPLICATIONS: SIR LEVEL C - Requires therapy, minor hospitalization (<48 hrs). TECHNIQUE: Informed written consent was obtained from the patient's brother after a thorough discussion of the procedural risks, benefits and alternatives. All questions were addressed. Maximal Sterile Barrier Technique was utilized including caps, mask, sterile gowns, sterile gloves, sterile drape, hand hygiene and skin antiseptic. A timeout was performed prior to the initiation of the procedure. The right groin  was prepped and draped in the usual sterile fashion. Using a micropuncture kit and the modified Seldinger technique, access was gained to the right common femoral artery and an 8 French sheath was placed. Under fluoroscopy, an 8 [place] Walrus balloon guide catheter was navigated over a 6 [place] Berenstein 2 catheter and a 0.035" Terumo Glidewire into the aortic arch. The catheter was placed into the right common carotid artery and then advanced into the right internal carotid artery. The inner catheter was removed. Frontal and lateral angiograms of the head were obtained. FINDINGS: 1. There is a mid right M1/MCA occlusion. 2. Atherosclerotic changes of the right carotid bifurcation without hemodynamically significant stenosis. PROCEDURE: Under biplane roadmap, a zoom 71 aspiration catheter was navigated over a Aristotle 18 microguidewire into the cavernous segment of the right ICA. The aspiration catheter was advanced to the level of occlusion at the right M1 segment and connected to a penumbra aspiration pump. The guiding catheter balloon was inflated. The aspiration catheter was removed under constant aspiration. Right internal carotid artery angiograms showed recanalization of the right M1 segment with occlusion of a distal M2/MCA posterior division branch. Under biplane roadmap, a zoom 71 aspiration catheter was navigated over a Aristotle 18 microguidewire into the right M1/MCA. The aspiration catheter was advanced over the  wire to the level of occlusion at the right M2 segment and connected to a penumbra aspiration pump. The guiding catheter balloon was inflated. The aspiration catheter was removed under constant aspiration. Right internal carotid artery angiograms showed recanalization of the right M2 segment with occlusion of M3/MCA posterior division branches. Under biplane roadmap, a zoom 71 aspiration catheter was navigated over a phenom 21 microcatheter and a Aristotle 18 microguidewire into the cavernous segment of the right ICA. The microcatheter was then navigated over the wire into the right M3/MCA. Then, a 3 mm solitaire stent retriever was deployed spanning the M2-M3 segment. The device was allowed to intercalated with the clot for 4 minutes. The microcatheter was removed. The aspiration catheter was advanced to the level of occlusion and connected to a penumbra aspiration pump. The guiding catheter balloon was inflated. The thrombectomy device and aspiration catheter were removed under constant aspiration. Right internal carotid artery angiograms showed recanalization of the proximal right M3 segment with occlusion of distal M3/MCA posterior division branches. Under biplane roadmap, a zoom 71 aspiration catheter was navigated over a phenom 21 microcatheter and a Aristotle 18 microguidewire into the cavernous segment of the right ICA. The microcatheter was then navigated over the wire into the right M3/MCA. Then, the microcatheter was connected to a penumbra aspiration pump. The guiding catheter balloon was inflated. The microcatheter was removed under constant aspiration. Right internal carotid artery angiograms showed persistent occlusion of a distal M3/MCA posterior division branch. Under biplane roadmap, a zoom 71 aspiration catheter was navigated over a phenom 21 microcatheter and a Aristotle 18 microguidewire into the cavernous segment of the right ICA. The microcatheter was then navigated over the wire into the  right M3/MCA. Then, the microcatheter was connected to a penumbra aspiration pump. The guiding catheter balloon was inflated. The microcatheter was removed under constant aspiration. Right internal carotid artery angiograms showed recanalization of the distal right M3 segment with occlusion of and M4 segment. Contrast extravasation was seen at the distal M3 level. Under biplane roadmap, a zoom 71 aspiration catheter was navigated over a phenom 21 microcatheter and a Aristotle 18 microguidewire into the cavernous segment of the right ICA. The microcatheter was then  navigated over the wire into the right M3/MCA. Then, a 2 x 6 mm microplex hypersoft coil was deployed in the distal right M3/MCA segment. Right internal carotid artery angiograms showed slow flow in the M3 branch with no evidence of extravasation. The guide catheter was retracted into the right common carotid artery. Frontal and lateral angiograms of the neck were obtained. Atherosclerotic changes noted in the right carotid bifurcation without hemodynamically significant stenosis or evidence of new clot formation. Flat panel CT of the head was obtained and post processed in a separate workstation with concurrent attending physician supervision. Selected images were sent to PACS. Small right sylvian contrast extravasation confirmed. Additionally, hyperdensity of the right basal ganglia and lateral right frontal cortex are noted, related to ongoing ischemia. Right internal carotid artery angiograms with frontal and lateral views of the head showed occlusion of the embolized branch with no evidence of contrast extravasation. Patency of the remaining MCA branches noted. The catheter was subsequently withdrawn. Right common femoral artery angiograms were obtained in right anterior oblique and lateral views. The puncture is at the level of the mid common femoral artery which has adequate caliber for closure device utilization. The femoral sheath was exchanged  for a Perclose pro style which was utilized for access closure. Immediate hemostasis was achieved. IMPRESSION: Mechanical thrombectomy performed for treatment of right M1/MCA occlusion with clot fragmentation and embolization to same distal territory requiring multiple additional passes. Procedure complicated by contrast extravasation at a distal right M3/MCA branch requiring vessel embolization. Near complete recanalization of the right MCA vascular tree was achieved (TICI 2b). PLAN: - Bed rest post femoral access x6h - Follow-up CT in 4 hours to evaluate for SAH/contrast extravasation stability - Transfer to ICU- SBP: 120-140 mmHg Electronically Signed   By: Pedro Earls M.D.   On: 05/15/2021 16:21   CT CEREBRAL PERFUSION W CONTRAST  Result Date: 05/15/2021 CLINICAL DATA:  Code stroke. EXAM: CT ANGIOGRAPHY HEAD AND NECK CT PERFUSION BRAIN TECHNIQUE: Multidetector CT imaging of the head and neck was performed using the standard protocol during bolus administration of intravenous contrast. Multiplanar CT image reconstructions and MIPs were obtained to evaluate the vascular anatomy. Carotid stenosis measurements (when applicable) are obtained utilizing NASCET criteria, using the distal internal carotid diameter as the denominator. Multiphase CT imaging of the brain was performed following IV bolus contrast injection. Subsequent parametric perfusion maps were calculated using RAPID software. CONTRAST:  36m OMNIPAQUE IOHEXOL 350 MG/ML SOLN COMPARISON:  Head CT from earlier today FINDINGS: CTA NECK FINDINGS Aortic arch: Motion degraded.  No acute finding Right carotid system: Mild mixed density plaque at the bifurcation. No stenosis or ulceration. Left carotid system: Mixed density plaque at the bifurcation. No stenosis or ulceration. Vertebral arteries: No proximal subclavian stenosis. Low-density bulge into the left subclavian lumen at the expected location of the left vertebral origin, with left  vertebral occlusion. There is V4 segment reconstitution. No flow limiting stenosis or dissection seen on the right. Skeleton: Retropharyngeal fluid. There is contusion like appearance of the left cheek and the left lateral neck. Muscles on the left lateral neck may be diffusely edematous and mildly swollen. Other neck: Subcentimeter nodules in the thyroid. No followup recommended (ref: J Am Coll Radiol. 2015 Feb;12(2): 143-50). Upper chest: Left supraclavicular stranding which is contiguous with the left neck edema. No evidence of air leak or contusion. Review of the MIP images confirms the above findings CTA HEAD FINDINGS Anterior circulation: Right M1 occlusion with abrupt cut off.  Heavily calcified carotid siphons which limits detection of luminal stenosis. The right ICA is more heavily affected with high-grade narrowing possible at the petrous/cavernous junction and anterior genu. Hypoplastic right A1 segment. Atheromatous irregularity of MCA and ACA branches. Posterior circulation: Reconstituted left V4. Atheromatous plaque at the bilateral V4 segments. The basilar is smooth and widely patent. No PCA branch occlusion or aneurysm. Venous sinuses: Right dominant transverse sigmoid system. No acute thrombosis. Anatomic variants: As above Review of the MIP images confirms the above findings CT Brain Perfusion Findings: ASPECTS: 7 - 8 ( caudate and putamen infarct. There could be anterior insular infarct on the right, but similar to the left and indeterminate due to the degree of chronic changes). CBF (<30%) Volume: 25m Perfusion (Tmax>6.0s) volume: 1078mMismatch Volume: 5278mnfarction Location:Lateral right frontal lobe and right basal ganglia Case discussed with Dr. KirLeonel Ramsayile in progress. IMPRESSION: 1. Emergent large vessel occlusion at the right M1 origin. CT perfusion suggestive 50 cc core infarct and 50 cc of adjacent penumbra. 2. Left vertebral occlusion at its origin (where there is subclavian  luminal density from thrombus or plaque) with reconstitution by the V4 segment. 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 3. Retropharyngeal fluid and subcutaneous/intramuscular edema in the left neck. Given the history this may be from traumatic soft tissue injury or pressure necrosis. 4. Premature atherosclerosis and chronic small vessel disease. High-grade narrowings of the right cavernous ICA which may be accentuated by underfilling from downstream occlusion and right A1 hypoplasia. Electronically Signed   By: JonMonte FantasiaD.   On: 05/15/2021 07:49   IR PERCUTANEOUS ART THROMBECTOMY/INFUSION INTRACRANIAL INC DIAG ANGIO  Result Date: 05/15/2021 INDICATION: [age]ar old female with past medical history significant for uncontrolled hypertension, tobacco use disorder, uterine fibroids, heart failure, paroxysmal SVT, paranoid psychosis presenting with altered mental status. Head CT showed hypodensity within the right basal ganglia concerning for possible right MCA occlusion. A CT angiogram of the head and neck was then obtained and showed an occlusion of the right M1/MCA segment. CT perfusion showed a core infarct of 51 mL with a penumbra of 52 mL. Case was discussed with patient's brother and decision was made to proceed with mechanical thrombectomy. EXAM: DIAGNOSTIC CEREBRAL ANGIOGRAM MECHANICAL THROMBECTOMY FLAT PANEL HEAD CT COMPARISON:  CT/CT angiogram of the head and neck May 15, 2021. MEDICATIONS: 5 mg of verapamil intra arterial. ANESTHESIA/SEDATION: Procedure was performed under general anesthesia. CONTRAST:  75 mL of Omnipaque 240 milligram/mL FLUOROSCOPY TIME:  Fluoroscopy Time: 21 minutes 24 seconds (1090 mGy). COMPLICATIONS: SIR LEVEL C - Requires therapy, minor hospitalization (<48 hrs). TECHNIQUE: Informed written consent was obtained from the patient's brother after a thorough discussion of the procedural risks, benefits and  alternatives. All questions were addressed. Maximal Sterile Barrier Technique was utilized including caps, mask, sterile gowns, sterile gloves, sterile drape, hand hygiene and skin antiseptic. A timeout was performed prior to the initiation of the procedure. The right groin was prepped and draped in the usual sterile fashion. Using a micropuncture kit and the modified Seldinger technique, access was gained to the right common femoral artery and an 8 French sheath was placed. Under fluoroscopy, an 8 [place] balloon guide catheter was navigated over a 6 [place] 2 catheter and a 0.035" Terumo Glidewire into the aortic arch. The catheter was placed into the right common carotid artery and then advanced into the right internal carotid artery. The inner catheter was removed. Frontal and lateral angiograms  of the head were obtained. FINDINGS: 1. There is a mid right M1/MCA occlusion. 2. Atherosclerotic changes of the right carotid bifurcation without hemodynamically significant stenosis. PROCEDURE: Under biplane roadmap, a zoom 71 aspiration catheter was navigated over a Aristotle 18 microguidewire into the cavernous segment of the right ICA. The aspiration catheter was advanced to the level of occlusion at the right M1 segment and connected to a penumbra aspiration pump. The guiding catheter balloon was inflated. The aspiration catheter was removed under constant aspiration. Right internal carotid artery angiograms showed recanalization of the right M1 segment with occlusion of a distal M2/MCA posterior division branch. Under biplane roadmap, a zoom 71 aspiration catheter was navigated over a Aristotle 18 microguidewire into the right M1/MCA. The aspiration catheter was advanced over the wire to the level of occlusion at the right M2 segment and connected to a penumbra aspiration pump. The guiding catheter balloon was inflated. The aspiration catheter was removed under constant aspiration. Right internal  carotid artery angiograms showed recanalization of the right M2 segment with occlusion of M3/MCA posterior division branches. Under biplane roadmap, a zoom 71 aspiration catheter was navigated over a phenom 21 microcatheter and a Aristotle 18 microguidewire into the cavernous segment of the right ICA. The microcatheter was then navigated over the wire into the right M3/MCA. Then, a 3 mm solitaire stent retriever was deployed spanning the M2-M3 segment. The device was allowed to intercalated with the clot for 4 minutes. The microcatheter was removed. The aspiration catheter was advanced to the level of occlusion and connected to a penumbra aspiration pump. The guiding catheter balloon was inflated. The thrombectomy device and aspiration catheter were removed under constant aspiration. Right internal carotid artery angiograms showed recanalization of the proximal right M3 segment with occlusion of distal M3/MCA posterior division branches. Under biplane roadmap, a zoom 71 aspiration catheter was navigated over a phenom 21 microcatheter and a Aristotle 18 microguidewire into the cavernous segment of the right ICA. The microcatheter was then navigated over the wire into the right M3/MCA. Then, the microcatheter was connected to a penumbra aspiration pump. The guiding catheter balloon was inflated. The microcatheter was removed under constant aspiration. Right internal carotid artery angiograms showed persistent occlusion of a distal M3/MCA posterior division branch. Under biplane roadmap, a zoom 71 aspiration catheter was navigated over a phenom 21 microcatheter and a Aristotle 18 microguidewire into the cavernous segment of the right ICA. The microcatheter was then navigated over the wire into the right M3/MCA. Then, the microcatheter was connected to a penumbra aspiration pump. The guiding catheter balloon was inflated. The microcatheter was removed under constant aspiration. Right internal carotid artery angiograms  showed recanalization of the distal right M3 segment with occlusion of and M4 segment. Contrast extravasation was seen at the distal M3 level. Under biplane roadmap, a zoom 71 aspiration catheter was navigated over a phenom 21 microcatheter and a Aristotle 18 microguidewire into the cavernous segment of the right ICA. The microcatheter was then navigated over the wire into the right M3/MCA. Then, a 2 x 6 mm microplex hypersoft coil was deployed in the distal right M3/MCA segment. Right internal carotid artery angiograms showed slow flow in the M3 branch with no evidence of extravasation. The guide catheter was retracted into the right common carotid artery. Frontal and lateral angiograms of the neck were obtained. Atherosclerotic changes noted in the right carotid bifurcation without hemodynamically significant stenosis or evidence of new clot formation. Flat panel CT of the head was obtained and  post processed in a separate workstation with concurrent attending physician supervision. Selected images were sent to PACS. Small right sylvian contrast extravasation confirmed. Additionally, hyperdensity of the right basal ganglia and lateral right frontal cortex are noted, related to ongoing ischemia. Right internal carotid artery angiograms with frontal and lateral views of the head showed occlusion of the embolized branch with no evidence of contrast extravasation. Patency of the remaining MCA branches noted. The catheter was subsequently withdrawn. Right common femoral artery angiograms were obtained in right anterior oblique and lateral views. The puncture is at the level of the mid common femoral artery which has adequate caliber for closure device utilization. The femoral sheath was exchanged for a Perclose pro style which was utilized for access closure. Immediate hemostasis was achieved. IMPRESSION: Mechanical thrombectomy performed for treatment of right M1/MCA occlusion with clot fragmentation and embolization  to same distal territory requiring multiple additional passes. Procedure complicated by contrast extravasation at a distal right M3/MCA branch requiring vessel embolization. Near complete recanalization of the right MCA vascular tree was achieved (TICI 2b). PLAN: - Bed rest post femoral access x6h - Follow-up CT in 4 hours to evaluate for SAH/contrast extravasation stability - Transfer to ICU- SBP: 120-140 mmHg Electronically Signed   By: Pedro Earls M.D.   On: 05/15/2021 16:21    Labs:  CBC: Recent Labs    05/15/21 0430 05/15/21 1221  WBC 10.2  --   HGB 8.0* 7.8*  HCT 27.4* 26.5*  PLT 356  --     COAGS: No results for input(s): INR, APTT in the last 8760 hours.  BMP: Recent Labs    05/15/21 0430  NA 140  K 3.3*  CL 108  CO2 21*  GLUCOSE 114*  BUN 17  CALCIUM 9.3  CREATININE 1.27*  GFRNONAA 52*    LIVER FUNCTION TESTS: Recent Labs    05/15/21 0430  BILITOT 1.0  AST 26  ALT 19  ALKPHOS 88  PROT 7.6  ALBUMIN 3.5    Assessment and Plan:  45 y/o F who presented as a code stroke on 5/24 and underwent cerebral arteriogram with emergent mechanical thrombectomy of right MCA M1/M2/M3 occlusions in NIR. The procedure was complicated by contrast extravasation and patient underwent embolization of the right MCA M3 branch using coil placement with Dr Tennis Must Sindy Messing.   On follow up exam today she remains lethargic, moaning, able to follow simple commands, left side remains weak. Per RN she was able to hold RLE up for 5 seconds earlier today but just had cortrak placed and she is tired from that. Right CFA puncture site unremarkable, dressing removed on exam today. She has bilateral periorbital and scleral edema of unclear etiology (possibly allergic reaction to gel placed on eyes prior to anesthesia?) although there is no hemorrhage noted so unlikely to be a result of the procedure in NIR. She is also noted to have abdominal distention and vaginal bleeding,  per chart review she does have a history of uterine fibroids which is likely the cause of her abdominal distention/firmness.   Recent imaging reviewed by Dr Tennis Must Sindy Messing today who notes subarachnoid hemorrhage to be small and stable.   Further plans/anticoagulation timing (if needed) per neurology - NIR remains available as needed, please call with questions or concerns.  Electronically Signed: Joaquim Nam, PA-C 05/16/2021, 11:07 AM   I spent a total of 15 Minutes at the the patient's bedside AND on the patient's hospital floor or unit, greater  than 50% of which was counseling/coordinating care for right MCA thrombectomy/coil embolization follow up.

## 2021-05-16 NOTE — Evaluation (Signed)
Clinical/Bedside Swallow Evaluation Patient Details  Name: Holly Bradley MRN: [id number] Date of Birth: June 01, 1976  Today's Date: 05/16/2021 Time: SLP Start Time (ACUTE ONLY): 1308 SLP Stop Time (ACUTE ONLY): 0937 SLP Time Calculation (min) (ACUTE ONLY): 8 min  Past Medical History:  Past Medical History:  Diagnosis Date  . Hypertension    Past Surgical History:  Past Surgical History:  Procedure Laterality Date  . IR ANGIOGRAM SELECTIVE EACH ADDITIONAL VESSEL  05/15/2021  . IR CT HEAD LTD  05/15/2021  . IR PERCUTANEOUS ART THROMBECTOMY/INFUSION INTRACRANIAL INC DIAG ANGIO  05/15/2021      . IR PERCUTANEOUS ART THROMBECTOMY/INFUSION INTRACRANIAL INC DIAG ANGIO  05/15/2021  . RADIOLOGY WITH ANESTHESIA N/A 05/15/2021   Procedure: IR WITH ANESTHESIA;  Surgeon: Radiologist, Medication, MD;  Location: [employer];  Service: Radiology;  Laterality: N/A;   HPI:  45 year old female with history of fibroids and hypertension who presents with altered mental status, slurred speech and minimally responsive after being found by roommates. MRI moderately large acute right MCA infarct. 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   Assessment / Plan / Recommendation Clinical Impression  Baseline swallow function completed with pt who demonstrates decreased alertness, awareness and interaction. She kept eyes closed (noted to have edema on lid surrounding tissue). She responds with tactile/verbal cues with significant dysarthria and decreased attention and ability to participate. Followed commands x 2 with noted mandible tremors on attempts to open oral cavity. (states she is cold and right arm tremors- RN aware). Minimal to no manipulation with ice chips and swallow not initiated. As alertness and cognition improve therapist will determine safety with po's. SLP Visit Diagnosis:  Dysphagia, unspecified (R13.10)    Aspiration Risk  Severe aspiration risk    Diet Recommendation NPO   Medication Administration: Via alternative means    Other  Recommendations Oral Care Recommendations: Oral care QID   Follow up Recommendations 24 hour supervision/assistance;Other (comment) (TBD)      Frequency and Duration min 2x/week  2 weeks       Prognosis Prognosis for Safe Diet Advancement: Good Barriers to Reach Goals: Severity of deficits      Swallow Study   General HPI: 45 year old female with history of fibroids and hypertension who presents with altered mental status, slurred speech and minimally responsive after being found by roommates. MRI moderately large acute right MCA infarct. 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 Type of Study: Bedside Swallow Evaluation Previous Swallow Assessment:  (none) Diet Prior to this Study: NPO Temperature Spikes Noted: No Respiratory Status: Nasal cannula History of Recent Intubation: No Behavior/Cognition: Requires cueing;Lethargic/Drowsy Oral Cavity Assessment: Dry Oral Care Completed by SLP: Yes Oral Cavity - Dentition:  (will assess when able to fully view) Vision:  (eyes closed and lid, tissure edematous) Self-Feeding Abilities: Total assist Patient Positioning: Upright in bed Baseline Vocal Quality: Low vocal intensity Volitional Cough: Cognitively unable to elicit Volitional Swallow: Unable to elicit    Oral/Motor/Sensory Function Overall Oral Motor/Sensory Function:  (not follow commands, left side weakness on observation)   Ice Chips Ice chips: Impaired Presentation: Spoon Oral Phase Impairments: Reduced labial seal;Reduced lingual movement/coordination;Poor awareness of bolus Oral Phase Functional Implications: Oral holding Pharyngeal Phase Impairments:  (not initiated)    Thin Liquid Thin Liquid: Not tested    Nectar Thick Nectar Thick Liquid: Not tested   Honey Thick Honey  Thick Liquid: Not tested   Puree Puree: Not tested   Solid     Solid: Not tested      [name] 05/16/2021,10:08 AM  [name].Ed Risk analyst [phone] Office [phone]

## 2021-05-16 NOTE — Progress Notes (Signed)
Orthopedic Tech Progress Note Patient Details:  Holly Bradley [birth year] [id number]  Ortho Devices Type of Ortho Device: Prafo boot/shoe Ortho Device/Splint Location: LLE Ortho Device/Splint Interventions: Ordered,Application,Adjustment   Post Interventions Patient Tolerated: Well Instructions Provided: [facility] [place] 05/16/2021, 3:29 PM

## 2021-05-16 NOTE — Progress Notes (Signed)
*  PRELIMINARY RESULTS* Echocardiogram 2D Echocardiogram has been performed.  Luisa Hart RDCS 05/16/2021, 10:06 AM

## 2021-05-16 NOTE — Progress Notes (Signed)
Dr. Leonie Man notified Roselyn Reef, RN of updated SBP goal <160. Medication orders updated.

## 2021-05-16 NOTE — Evaluation (Signed)
Physical Therapy Evaluation Patient Details Name: Holly Bradley MRN: [id number] DOB: 1976-12-13 Today's Date: 05/16/2021   History of Present Illness  45 yo female s/p mechanical thrombectomy on 5/24 of right MCA M1/M2/M3 and embolization of right MCA M3 branch due to contrast extravasation PMH fibriods HTN HF paranoid psychosis recent hospitalizations january and february 2022  Clinical Impression  Prior to admission, pt lives with her brother and work in [employer]. Pt presents with L hemiparesis, forced right gaze deviation, expressive aphasia, poor sitting balance. Pt able to follow 1-2 step commands. Requiring two person mod-max assist for bed mobility. In sitting position, demonstrates left truncal rotation, lateral lean, pushing with RUE. Able to participate in right lateral leans onto forearm to promote midline posture. BP 151/91 sitting, 142/66 with return to supine. Given PLOF, motivation and family support, recommend CIR to address deficits and maximize functional mobility.     Follow Up Recommendations CIR    Equipment Recommendations  3in1 (PT);Wheelchair (measurements PT);Wheelchair cushion (measurements PT);Hospital bed    Recommendations for Other Services       Precautions / Restrictions Precautions Precautions: Fall;Other (comment) Precaution Comments: L hemiparesis, PRAFO, hand splint, BP < 160 Restrictions Weight Bearing Restrictions: No      Mobility  Bed Mobility Overal bed mobility: Needs Assistance Bed Mobility: Rolling;Supine to Sit;Sit to Supine Rolling: Mod assist   Supine to sit: +2 for physical assistance;Max assist Sit to supine: +2 for physical assistance;Mod assist   General bed mobility comments: pt requires (A) to sequence task and push up into static sitting. pt requires total (A) at EOB Pt with L UE subluxation noted. Pt pushing with R UE toward L side. Pt requires cues for alignment. pt with neck flexion and does initiate extension with  cue. pt requires control to lower trunk to bed surface and elevate bil LE    Transfers                 General transfer comment: NT  Ambulation/Gait                Stairs            Wheelchair Mobility    Modified Rankin (Stroke Patients Only) Modified Rankin (Stroke Patients Only) Pre-Morbid Rankin Score: No symptoms Modified Rankin: Severe disability     Balance Overall balance assessment: Needs assistance Sitting-balance support: Single extremity supported;Feet supported Sitting balance-Leahy Scale: Zero   Postural control: Left lateral lean                                   Pertinent Vitals/Pain Pain Assessment: No/denies pain    Home Living Family/patient expects to be discharged to:: Private residence Living Arrangements: Other relatives (brother) Available Help at Discharge: Family Type of Home: House Home Access: Stairs to enter   Technical brewer of Steps: 1 (through garage) Home Layout: Two level;Bed/bath upstairs;1/2 bath on main level Home Equipment: None Additional Comments: 1/2 bath on main floor    Prior Function Level of Independence: Independent         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 1 finger subluxation noted LUE Sensation: decreased light touch;decreased proprioception LUE Coordination: decreased fine motor;decreased gross motor    Lower  Extremity Assessment Lower Extremity Assessment: LLE deficits/detail LLE Deficits / Details: No active movement noted. Ankle dorsiflexion limited passively ~20 degrees from neutral    Cervical / Trunk Assessment Cervical / Trunk Assessment: Other exceptions Cervical / Trunk Exceptions: R neck rotation flexion preference   Communication   Communication: Expressive difficulties  Cognition Arousal/Alertness: Awake/alert Behavior During Therapy: WFL for tasks assessed/performed Overall Cognitive Status: Difficult to assess Area of Impairment: Following commands                       Following Commands: Follows multi-step commands inconsistently       General Comments: pt smiling responding to therapist. pt with dysarthria but states she is thirsty and wants water . water was very clearly expressed. pt likes 90s music and nodding head to brother telling therapist      General Comments General comments (skin integrity, edema, etc.): VSS sitting 151/91 and return to supine 142/66    Exercises Other Exercises Other Exercises: recommendation for resting hand splint 4 hours on 4 hours off order being placed   Assessment/Plan    PT Assessment Patient needs continued PT services  PT Problem List Decreased strength;Decreased activity tolerance;Decreased balance;Decreased mobility;Decreased cognition;Impaired tone       PT Treatment Interventions Functional mobility training;Therapeutic activities;Therapeutic exercise;Balance training;Neuromuscular re-education;Cognitive remediation;Patient/family education    PT Goals (Current goals can be found in the Care Plan section)  Acute Rehab PT Goals Patient Stated Goal: water to drink PT Goal Formulation: With patient Time For Goal Achievement: 05/30/21 Potential to Achieve Goals: Good    Frequency Min 4X/week   Barriers to discharge        Co-evaluation PT/OT/SLP Co-Evaluation/Treatment: Yes Reason for Co-Treatment: Necessary to address cognition/behavior during functional activity;Complexity of the patient's impairments (multi-system involvement);For patient/therapist safety;To address functional/ADL transfers PT goals addressed during session: Mobility/safety with mobility;Balance OT goals addressed during session: ADL's and self-care;Proper  use of Adaptive equipment and DME;Strengthening/ROM       AM-PAC PT "6 Clicks" Mobility  Outcome Measure Help needed turning from your back to your side while in a flat bed without using bedrails?: A Lot Help needed moving from lying on your back to sitting on the side of a flat bed without using bedrails?: Total Help needed moving to and from a bed to a chair (including a wheelchair)?: Total Help needed standing up from a chair using your arms (e.g., wheelchair or bedside chair)?: Total Help needed to walk in hospital room?: Total Help needed climbing 3-5 steps with a railing? : Total 6 Click Score: 7    End of Session   Activity Tolerance: Patient limited by fatigue Patient left: in bed;with call bell/phone within reach;with bed alarm set;with family/visitor present Nurse Communication: Mobility status PT Visit Diagnosis: Hemiplegia and hemiparesis;Other abnormalities of gait and mobility (R26.89) Hemiplegia - Right/Left: Left Hemiplegia - dominant/non-dominant: Non-dominant Hemiplegia - caused by: Cerebral infarction    Time: 1341-1405 PT Time Calculation (min) (ACUTE ONLY): 24 min   Charges:   PT Evaluation $PT Eval Moderate Complexity: 1 Mod          Wyona Almas, PT, DPT Acute Rehabilitation Services Pager [phone] Office [place] 05/16/2021, 3:41 PM

## 2021-05-16 NOTE — Progress Notes (Signed)
Initial Nutrition Assessment  DOCUMENTATION CODES:   Not applicable  INTERVENTION:    Initiate tube feeding via Cortrak: Osmolite 1.5 at 50 ml/h (1200 ml per day) Prosource TF 45 ml BID  Provides 1880 kcal, 97 gm protein, 912 ml free water daily   NUTRITION DIAGNOSIS:   Inadequate oral intake related to inability to eat as evidenced by NPO status.  GOAL:   Patient will meet greater than or equal to 90% of their needs  MONITOR:   TF tolerance,Labs  REASON FOR ASSESSMENT:   Consult Enteral/tube feeding initiation and management  ASSESSMENT:   Pt with PMH of uncontrolled HTN, tobacco abuse, uterine fibroids, CHF, paroxysmal SVT, and paranoid psychosis admitted with significant encephalopathy and L sided weakness. 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   Pt discussed during ICU rounds and with RN.  Pt remains sleepy, failed swallow eval. Noted abd exam pt with 2 large masses which were very firm, per notes pt with uterine fibroids.    5/25 pt failed swallow eval; cortrak placed with tip gastric   Medications reviewed and include:  Hypertonic saline @ 75 ml/hr Cleviprex @ 56 ml/hr provides: 2688 kcal  Labs reviewed: K+3.3, Ammonia: <9    NUTRITION - FOCUSED PHYSICAL EXAM:  Flowsheet Row Most Recent Value  Orbital Region No depletion  Upper Arm Region No depletion  Thoracic and Lumbar Region No depletion  Buccal Region No depletion  Temple Region No depletion  Clavicle Bone Region No depletion  Clavicle and Acromion Bone Region No depletion  Scapular Bone Region No depletion  Dorsal Hand No depletion  Patellar Region No depletion  Anterior Thigh Region No depletion  Posterior Calf Region No depletion  Edema (RD Assessment) --  [orbital]  Hair Reviewed  Eyes Unable to assess  Mouth Unable to assess  Skin Reviewed  Nails Reviewed       Diet Order:   Diet Order            Diet NPO  time specified  Diet effective now                 EDUCATION NEEDS:      Skin:  Skin Assessment: Reviewed RN Assessment  Last BM:  unknown  Height:   Ht Readings from Last 1 Encounters:  05/15/21 5\' 5"  (1.651 m)    Weight:   Wt Readings from Last 1 Encounters:  05/15/21 72.6 kg    Ideal Body Weight:     BMI:  Body mass index is 26.63 kg/m.  Estimated Nutritional Needs:   Kcal:  1850-2100  Protein:  90-105 grams  Fluid:  >1.8 L/day  Lockie Pares., RD, LDN, CNSC See AMiON for contact information

## 2021-05-16 NOTE — Evaluation (Signed)
Occupational Therapy Evaluation Patient Details Name: Holly Bradley MRN: [id number] DOB: [birth year] Today's Date: 05/16/2021    History of Present Illness Pt is a [name] who presents with AMS and slurred speech after being found down. MRI showing moderately large acute right MCA infarct with associated petechial hemorrhage, SAH, and minimal IVH. S/p mechanical thrombectomy of M1/M2/M3 and embolization of right MCA M3 branch. Significant PMH: uncontrolled HTN, tobacco use disorder, HF, paroxysmal SVT, paranoid psychosis.   Clinical Impression   Patient is s/p thrombectomy R MCA surgery resulting in functional limitations due to the deficits listed below (see OT problem list). PTA pt was independent and working. Pt currently with L hemiplegia flaccid. Pt responding verbally with expressive aphasia. Pt following 2 step commands. Pt with visual neglect to L side and L side of body.  Patient will benefit from skilled OT acutely to increase independence and safety with ADLS to allow discharge CIR.     Follow Up Recommendations  CIR    Equipment Recommendations  3 in 1 bedside commode;Wheelchair (measurements OT);Wheelchair cushion (measurements OT);Hospital bed    Recommendations for Other Services Rehab consult;Speech consult     Precautions / Restrictions Precautions Precautions: Fall;Other (comment) Precaution Comments: L hemiparesis, PRAFO, hand splint Restrictions Weight Bearing Restrictions: No      Mobility Bed Mobility Overal bed mobility: Needs Assistance Bed Mobility: Rolling;Supine to Sit;Sit to Supine Rolling: Mod assist   Supine to sit: +2 for physical assistance;Max assist Sit to supine: +2 for physical assistance;Mod assist   General bed mobility comments: pt requires (A) to sequence task and push up into static sitting. pt requires total (A) at EOB Pt with L UE subluxation noted. Pt pushing with R UE toward L side. Pt requires cues for alignment. pt with neck  flexion and does initiate extension with cue. pt requires control to lower trunk to bed surface and elevate bil LE    Transfers                 General transfer comment: NT    Balance Overall balance assessment: Needs assistance Sitting-balance support: Single extremity supported;Feet supported Sitting balance-Leahy Scale: Zero                                     ADL either performed or assessed with clinical judgement   ADL Overall ADL's : Needs assistance/impaired Eating/Feeding: NPO               Upper Body Dressing : Maximal assistance   Lower Body Dressing: Total assistance                 General ADL Comments: pt with visual deficits noted and needs (A) to static sit EOB. pt demonstrates pushign with R UE and extension     Vision Baseline Vision/History: Wears glasses Wears Glasses: Reading only Vision Assessment?: Yes Eye Alignment: Impaired (comment) Alignment/Gaze Preference: Gaze right;Head turned;Chin down Tracking/Visual Pursuits: Requires cues, head turns, or add eye shifts to track;Unable to hold eye position out of midline;Impaired - to be further tested in functional context Additional Comments: pt with a downward R gaze preference. pt is able to see therapist fingers and demonstrat the same number back to therapist. pt consistently following with brisk response     Perception     Praxis      Pertinent Vitals/Pain Pain Assessment: No/denies pain     Hand Dominance  Right   Extremity/Trunk Assessment Upper Extremity Assessment Upper Extremity Assessment: LUE deficits/detail LUE Deficits / Details: flaccid recommendation for resting hand splint. 1 finger subluxation noted LUE Sensation: decreased light touch;decreased proprioception LUE Coordination: decreased fine motor;decreased gross motor   Lower Extremity Assessment Lower Extremity Assessment: Defer to PT evaluation   Cervical / Trunk Assessment Cervical /  Trunk Assessment: Other exceptions Cervical / Trunk Exceptions: R neck rotation flexion preference   Communication Communication Communication: Expressive difficulties   Cognition Arousal/Alertness: Awake/alert Behavior During Therapy: WFL for tasks assessed/performed Overall Cognitive Status: Difficult to assess Area of Impairment: Following commands                       Following Commands: Follows multi-step commands consistently       General Comments: pt smiling responding to therapist. pt states she is thristy  and wants water . water was very clearly expressed. pt likes 90s music and nodding head to brother telling therapist   General Comments  VSS sitting 151/91 and return to supine 142/66    Exercises Exercises: Other exercises Other Exercises Other Exercises: recommendation for resting hand splint 4 hours on 4 hours off order being placed   Shoulder Instructions      Home Living Family/patient expects to be discharged to:: Private residence Living Arrangements: Other relatives (brother) Available Help at Discharge: Family;Available 24 hours/day Type of Home: House Home Access: Stairs to enter [employer] of Steps: 1 (through garage)   Home Layout: Two level Alternate Level Stairs-Number of Steps:  (flight)   Bathroom Shower/Tub: Tub/shower unit         Home Equipment: None   Additional Comments: 1/2 bath on main floor      Prior Functioning/Environment Level of Independence: Independent        Comments: Works in [employer]        OT Problem List: Decreased strength;Decreased range of motion;Decreased activity tolerance;Impaired vision/perception;Impaired balance (sitting and/or standing);Decreased coordination;Decreased cognition;Decreased safety awareness;Decreased knowledge of use of DME or AE;Decreased knowledge of precautions;Cardiopulmonary status limiting activity;Impaired sensation;Impaired UE functional use;Increased edema       OT Treatment/Interventions:      OT Goals(Current goals can be found in the care plan section) Acute Rehab OT Goals Patient Stated Goal: water to drink OT Goal Formulation: With patient/family Time For Goal Achievement: 05/30/21 Potential to Achieve Goals: Good  OT Frequency: Min 3X/week   Barriers to D/C:            Co-evaluation PT/OT/SLP Co-Evaluation/Treatment: Yes Reason for Co-Treatment: Complexity of the patient's impairments (multi-system involvement);Necessary to address cognition/behavior during functional activity;For patient/therapist safety;To address functional/ADL transfers PT goals addressed during session: Mobility/safety with mobility;Balance OT goals addressed during session: ADL's and self-care;Proper use of Adaptive equipment and DME;Strengthening/ROM      AM-PAC OT "6 Clicks" Daily Activity     Outcome Measure Help from another person eating meals?: A Lot Help from another person taking care of personal grooming?: A Lot   Help from another person bathing (including washing, rinsing, drying)?: Total Help from another person to put on and taking off regular upper body clothing?: Total Help from another person to put on and taking off regular lower body clothing?: Total 6 Click Score: 7   End of Session Nurse Communication: Mobility status;Precautions  Activity Tolerance: Patient tolerated treatment well Patient left: in bed;with call bell/phone within reach;with bed alarm set;with family/visitor present;with restraints reapplied  OT Visit Diagnosis: Unsteadiness on feet (R26.81);Muscle weakness (  generalized) (M62.81);Hemiplegia and hemiparesis Hemiplegia - Right/Left: Left Hemiplegia - caused by: Cerebral infarction                Time: 1342-1406 OT Time Calculation (min): 24 min Charges:  OT General Charges $OT Visit: 1 Visit OT Evaluation $OT Eval Moderate Complexity: 1 Mod   Brynn, OTR/L  Acute Rehabilitation Services Pager:  [phone] Office: [phone] .   Jeri Modena 05/16/2021, 2:43 PM

## 2021-05-16 NOTE — Evaluation (Signed)
Speech Language Pathology Evaluation Patient Details Name: Holly Bradley MRN: [id number] DOB: 08/29/1976 Today's Date: 05/16/2021 Time: 3299-2426 SLP Time Calculation (min) (ACUTE ONLY): 7 min  Problem List:  Patient Active Problem List   Diagnosis Date Noted  . 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 Hypertension    Past Surgical History:  Past Surgical History:  Procedure Laterality Date  . IR ANGIOGRAM SELECTIVE EACH ADDITIONAL VESSEL  05/15/2021  . IR CT HEAD LTD  05/15/2021  . IR PERCUTANEOUS ART THROMBECTOMY/INFUSION INTRACRANIAL INC DIAG ANGIO  05/15/2021      . IR PERCUTANEOUS ART THROMBECTOMY/INFUSION INTRACRANIAL INC DIAG ANGIO  05/15/2021  . RADIOLOGY WITH ANESTHESIA N/A 05/15/2021   Procedure: IR WITH ANESTHESIA;  Surgeon: Radiologist, Medication, MD;  Location: [facility];  Service: Radiology;  Laterality: N/A;   HPI:  45 year old female with history of fibroids and hypertension who presents with altered mental status, slurred speech and minimally responsive after being found by roommates. MRI moderately large acute right MCA infarct. 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   Assessment / Plan / Recommendation Clinical Impression  Lethargy was greatest impeding factor for assessment of speech-language-cognition primarily in attention and awareness. Kept eyes closed and responsive to questions 60% of the time following simple motor commands for therapist and Dr Leonie Man during his assessment. Speech is significantly dysarthric and intermittently intelligible at word level marked by low vocal intensity and poor articulatory movements. SLP will continue to see and evaluate during treatment and progress in her communicative, speech and cognitive ability.    SLP Assessment  SLP  Recommendation/Assessment: Patient needs continued Speech Lanaguage Pathology Services SLP Visit Diagnosis: Dysarthria and anarthria (R47.1);Cognitive communication deficit (R41.841)    Follow Up Recommendations  Inpatient Rehab;24 hour supervision/assistance    Frequency and Duration min 2x/week  2 weeks      SLP Evaluation Cognition  Overall Cognitive Status: Impaired/Different from baseline Arousal/Alertness: Lethargic Orientation Level: Disoriented to place;Disoriented to time;Disoriented to situation Attention: Focused Focused Attention: Impaired Focused Attention Impairment: Verbal basic Memory:  (TBA) Awareness: Impaired Awareness Impairment: Emergent impairment Problem Solving:  (will assess as alertness improves) Safety/Judgment: Impaired       Comprehension  Auditory Comprehension Overall Auditory Comprehension:  (followed commands x 2, no response to some questions- will assess further in tx) Commands:  (x 2) Interfering Components: Attention;Processing speed Visual Recognition/Discrimination Discrimination: Not tested Reading Comprehension Reading Status: Not tested    Expression Expression Primary Mode of Expression: Verbal Verbal Expression Overall Verbal Expression:  (suspect more dysarthric- will continue to assess) Repetition:  (NT) Naming: Not tested Pragmatics: Impairment Impairments: Abnormal affect;Eye contact Interfering Components: Attention Written Expression Written Expression: Not tested   Oral / Motor  Oral Motor/Sensory Function Overall Oral Motor/Sensory Function: Mild impairment (tremorous attempts to open mouth) Facial Symmetry: Abnormal symmetry left;Suspected CN VII (facial) dysfunction Mandible:  (tremorous) Motor Speech Overall Motor Speech: Impaired Respiration: Impaired Level of Impairment: Phrase Phonation: Low vocal intensity Resonance: Within functional limits Articulation: Impaired Level of Impairment:  Word Intelligibility: Intelligibility reduced Word: 25-49% accurate Phrase: 0-24% accurate Motor Planning: Witnin functional limits   GO                    [name] 05/16/2021, 10:41 AM  [name].Ed Risk analyst [phone] Office [phone]

## 2021-05-17 ENCOUNTER — Inpatient Hospital Stay (HOSPITAL_COMMUNITY): Payer: Medicaid Other

## 2021-05-17 DIAGNOSIS — I63511 Cerebral infarction due to unspecified occlusion or stenosis of right middle cerebral artery: Secondary | ICD-10-CM

## 2021-05-17 DIAGNOSIS — I639 Cerebral infarction, unspecified: Secondary | ICD-10-CM

## 2021-05-17 DIAGNOSIS — I63311 Cerebral infarction due to thrombosis of right middle cerebral artery: Secondary | ICD-10-CM | POA: Diagnosis not present

## 2021-05-17 LAB — POCT I-STAT 7, (LYTES, BLD GAS, ICA,H+H)
Acid-base deficit: 7 mmol/L — ABNORMAL HIGH (ref 0.0–2.0)
Bicarbonate: 17.6 mmol/L — ABNORMAL LOW (ref 20.0–28.0)
Calcium, Ion: 1.2 mmol/L (ref 1.15–1.40)
HCT: 23 % — ABNORMAL LOW (ref 36.0–46.0)
Hemoglobin: 7.8 g/dL — ABNORMAL LOW (ref 12.0–15.0)
O2 Saturation: 99 %
Patient temperature: 101.4
Potassium: 3.3 mmol/L — ABNORMAL LOW (ref 3.5–5.1)
Sodium: 158 mmol/L — ABNORMAL HIGH (ref 135–145)
TCO2: 19 mmol/L — ABNORMAL LOW (ref 22–32)
pCO2 arterial: 33.7 mmHg (ref 32.0–48.0)
pH, Arterial: 7.333 — ABNORMAL LOW (ref 7.350–7.450)
pO2, Arterial: 156 mmHg — ABNORMAL HIGH (ref 83.0–108.0)

## 2021-05-17 LAB — HEMOGLOBIN A1C
Hgb A1c MFr Bld: 5.4 % (ref 4.8–5.6)
Mean Plasma Glucose: 108 mg/dL

## 2021-05-17 LAB — PHOSPHORUS: Phosphorus: 3.3 mg/dL (ref 2.5–4.6)

## 2021-05-17 LAB — BASIC METABOLIC PANEL
Anion gap: 11 (ref 5–15)
BUN: 14 mg/dL (ref 6–20)
CO2: 16 mmol/L — ABNORMAL LOW (ref 22–32)
Calcium: 8.6 mg/dL — ABNORMAL LOW (ref 8.9–10.3)
Chloride: 123 mmol/L — ABNORMAL HIGH (ref 98–111)
Creatinine, Ser: 1.48 mg/dL — ABNORMAL HIGH (ref 0.44–1.00)
GFR, Estimated: 44 mL/min — ABNORMAL LOW (ref >60)
Glucose, Bld: 127 mg/dL — ABNORMAL HIGH (ref 70–99)
Potassium: 3.5 mmol/L (ref 3.5–5.1)
Sodium: 150 mmol/L — ABNORMAL HIGH (ref 135–145)

## 2021-05-17 LAB — MAGNESIUM: Magnesium: 2 mg/dL (ref 1.7–2.4)

## 2021-05-17 LAB — GLUCOSE, CAPILLARY
Glucose-Capillary: 131 mg/dL — ABNORMAL HIGH (ref 70–99)
Glucose-Capillary: 139 mg/dL — ABNORMAL HIGH (ref 70–99)
Glucose-Capillary: 132 mg/dL — ABNORMAL HIGH (ref 70–99)
Glucose-Capillary: 136 mg/dL — ABNORMAL HIGH (ref 70–99)
Glucose-Capillary: 108 mg/dL — ABNORMAL HIGH (ref 70–99)
Glucose-Capillary: 124 mg/dL — ABNORMAL HIGH (ref 70–99)

## 2021-05-17 LAB — SODIUM
Sodium: 145 mmol/L (ref 135–145)
Sodium: 150 mmol/L — ABNORMAL HIGH (ref 135–145)
Sodium: 152 mmol/L — ABNORMAL HIGH (ref 135–145)

## 2021-05-17 IMAGING — CT CT HEAD W/O CM
3 series · 14 of 47 positions shown, 16 images · non-contrast
Comparison: Noncontrast head CT [DATE] and MRI [DATE]

CLINICAL DATA: Stroke follow-up. Increased work of breathing.
Status post mechanical thrombectomy for treatment of right M1
occlusion.

EXAM:
CT HEAD WITHOUT CONTRAST
TECHNIQUE: Contiguous axial images were obtained from the base of the skull
through the vertex without intravenous contrast.

[Series 3: head 5.0 h30s · axial · 0.41mm/px · z∈[+1323,+1458]mm · 8 of 33 slices shown, 10 images]
[im 3/33  brain]
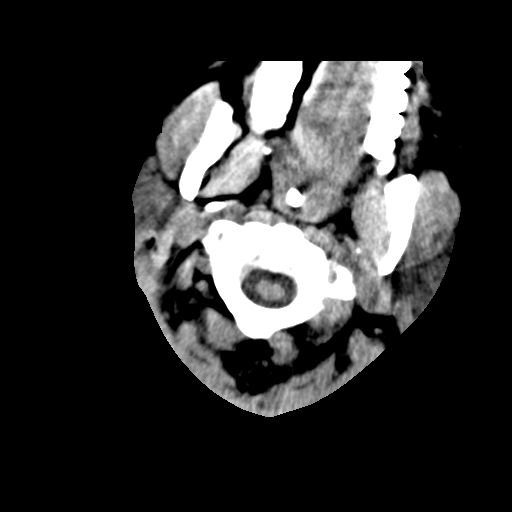
[im 3/33  bone]
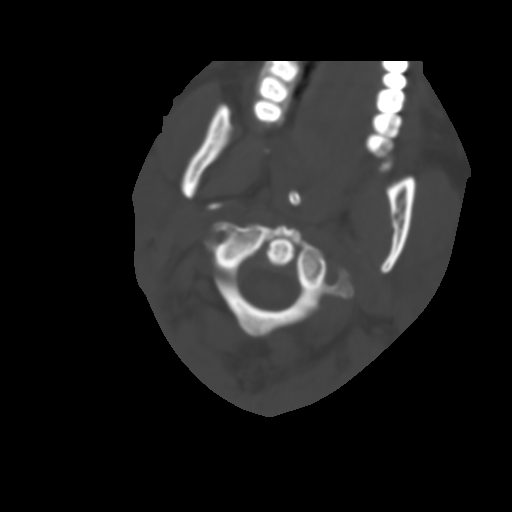
[im 7/33  brain]
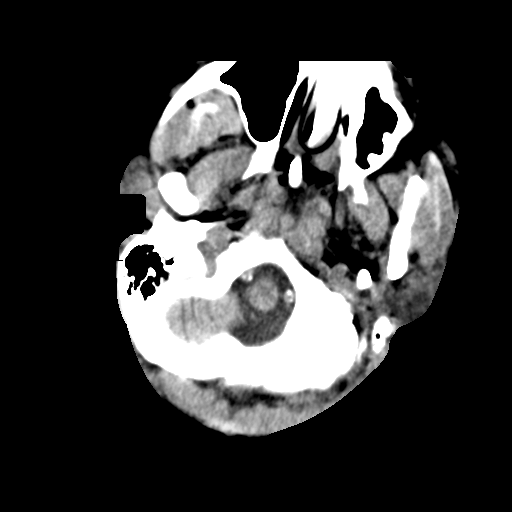
[im 10/33  brain]
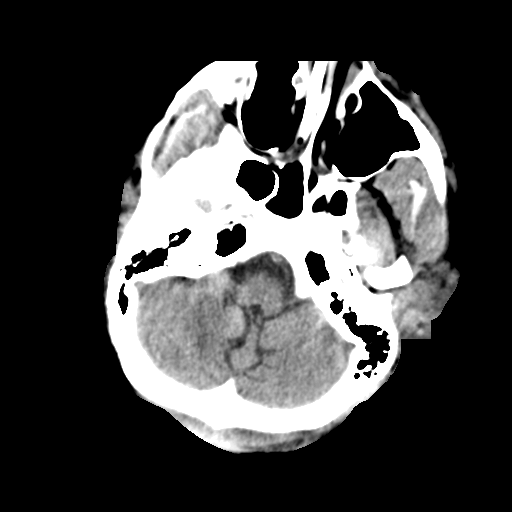
[im 15/33  brain]
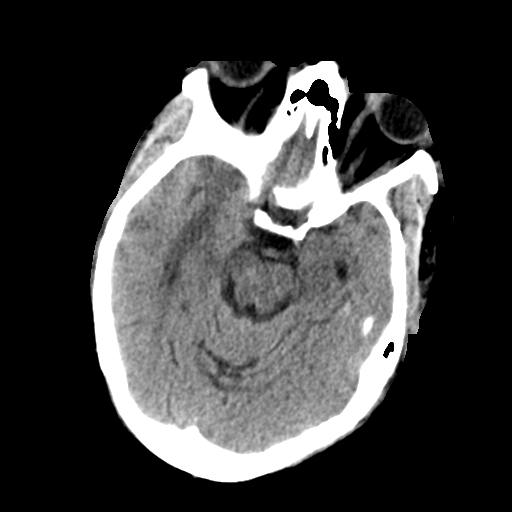
[im 18/33  brain]
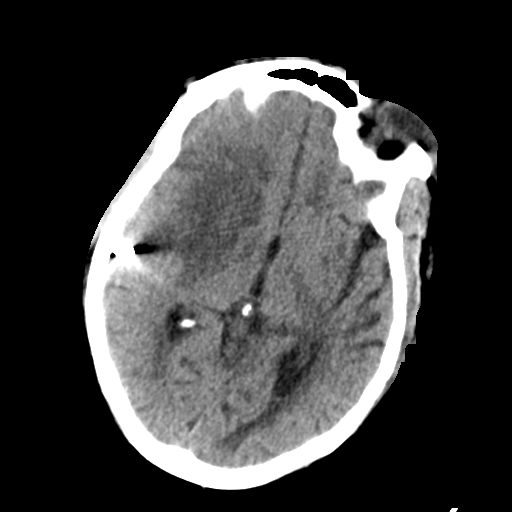
[im 18/33  bone]
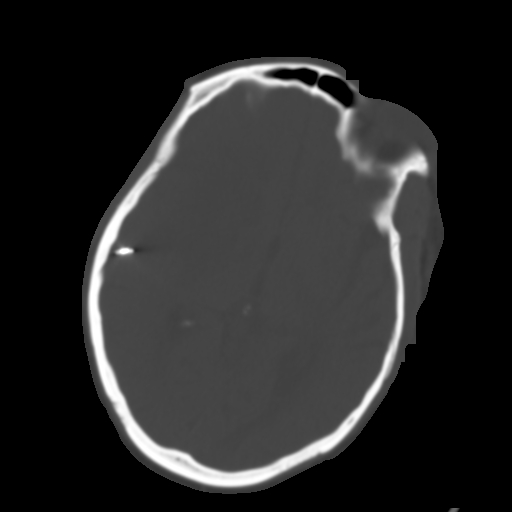
[im 23/33  brain]
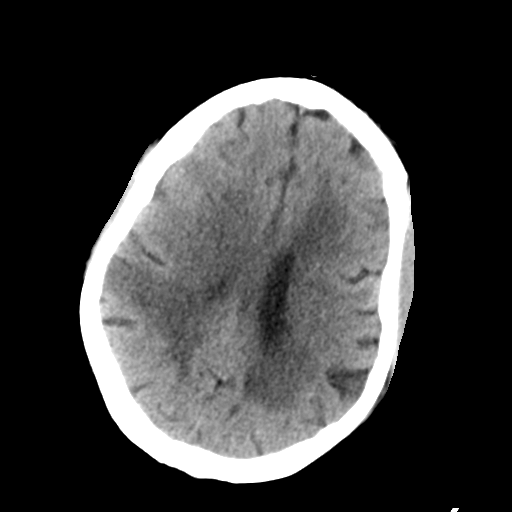
[im 26/33  brain]
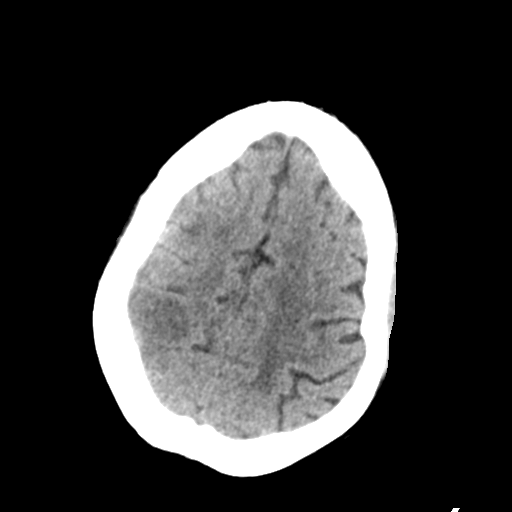
[im 30/33  brain]
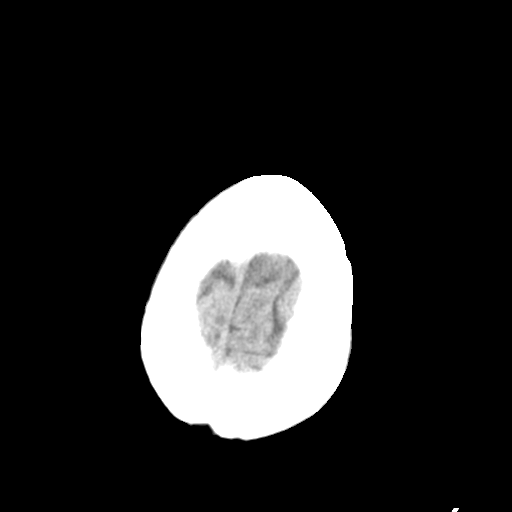

[Series 5: head 3.0 mpr cor · coronal · 0.32mm/px · 3 of 69 slices shown]
[im 23/69  brain]
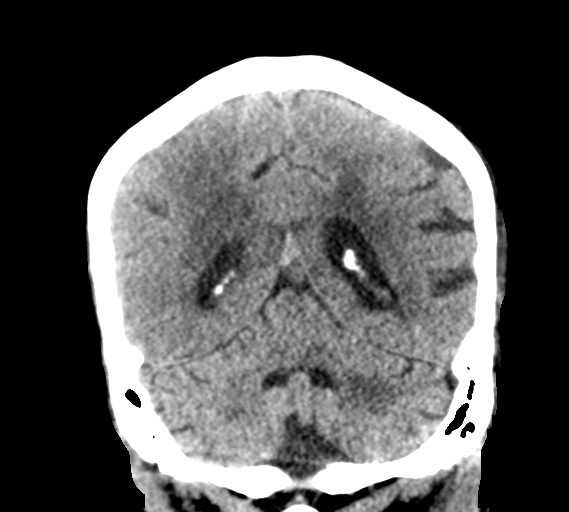
[im 31/69  brain]
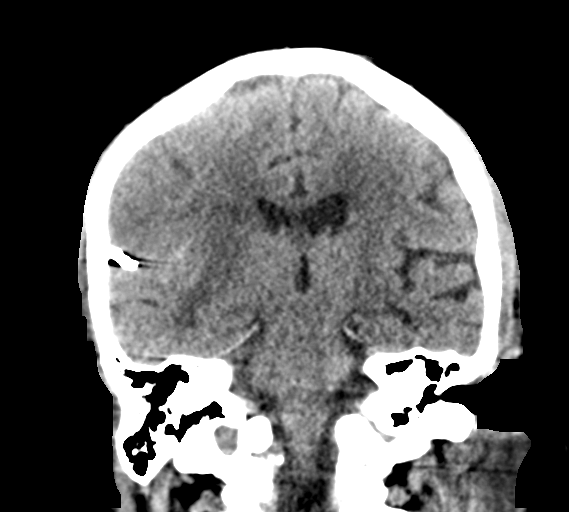
[im 38/69  brain]
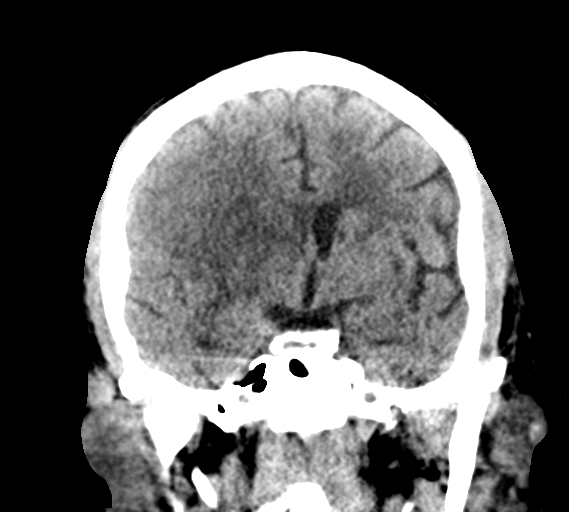

[Series 6: head 3.0 mpr sag · sagittal · 0.32mm/px · 3 of 67 slices shown]
[im 23/67  brain]
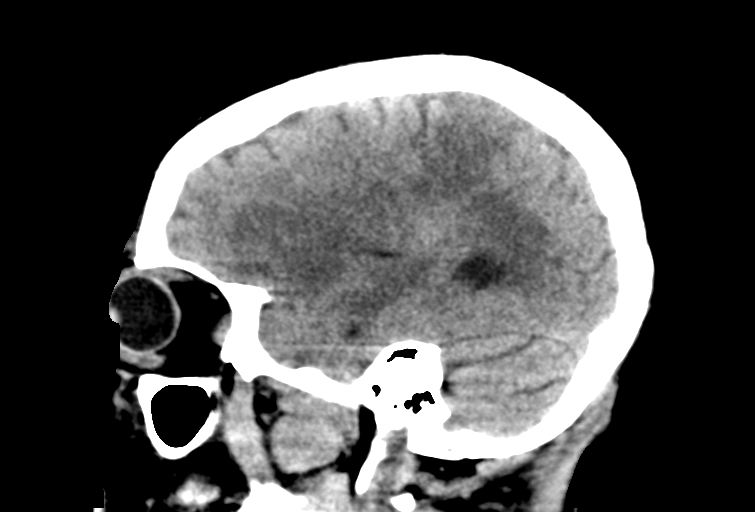
[im 34/67  brain]
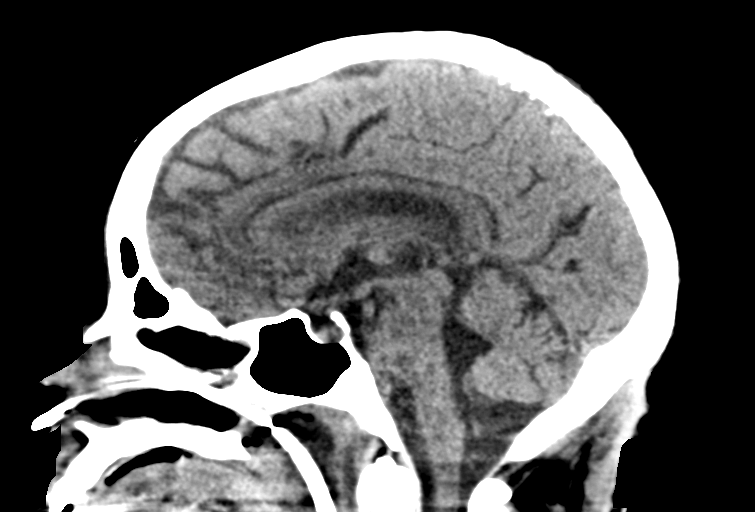
[im 45/67  brain]
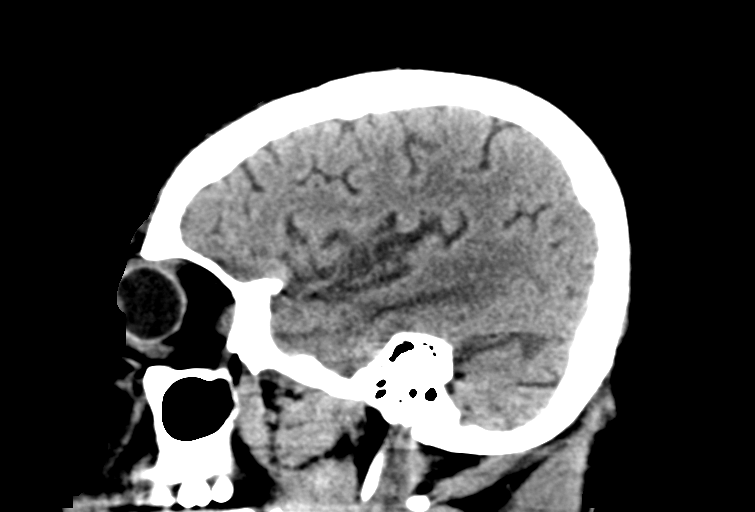

[14 of 47 positions shown; findings below may reference images not displayed]

FINDINGS: Brain: A moderately large right MCA infarct is again seen with
progressive cytotoxic edema including extensive involvement of the
basal ganglia. There is partial effacement of the right lateral
ventricle with mildly increased leftward midline shift, now
measuring 5 mm. Small volume subarachnoid hemorrhage near the
posterior aspect of the right sylvian fissure and in adjacent
frontoparietal sulci has decreased. Trace intraventricular
hemorrhage is also less conspicuous.

There is a background of extensive chronic small vessel ischemia in
the cerebral white matter bilaterally. Chronic lacunar infarcts are
again noted in the left basal ganglia and pons. There is an
unchanged chronic infarct superiorly in the left cerebellar
hemisphere. Hypodensity more anteriorly in the left cerebellar
hemisphere is similar to the prior CT and remains indeterminate for
an acute infarct or artifact.

Vascular: Calcified atherosclerosis at the skull base. Embolization
of a right M3 branch vessel.

Skull: No fracture suspicious osseous lesion.

Sinuses/Orbits: Paranasal sinuses and mastoid air cells are clear.
Unremarkable orbits.

Other: Left periorbital soft tissue swelling.
IMPRESSION: 1. Evolving right MCA infarct with increased cytotoxic edema and
mildly increased leftward midline shift, now 5 mm.
2. Decreased subarachnoid and intraventricular hemorrhage.
3. Persistent hypodensity in the left cerebellar hemisphere,
indeterminate for acute infarct versus artifact.

## 2021-05-17 IMAGING — DX DG CHEST 1V PORT
1 series · 1 of 1 positions shown · non-contrast
Comparison: [DATE] p.m.

CLINICAL DATA: Respiratory failure

EXAM:
PORTABLE CHEST 1 VIEW

[chest ap]
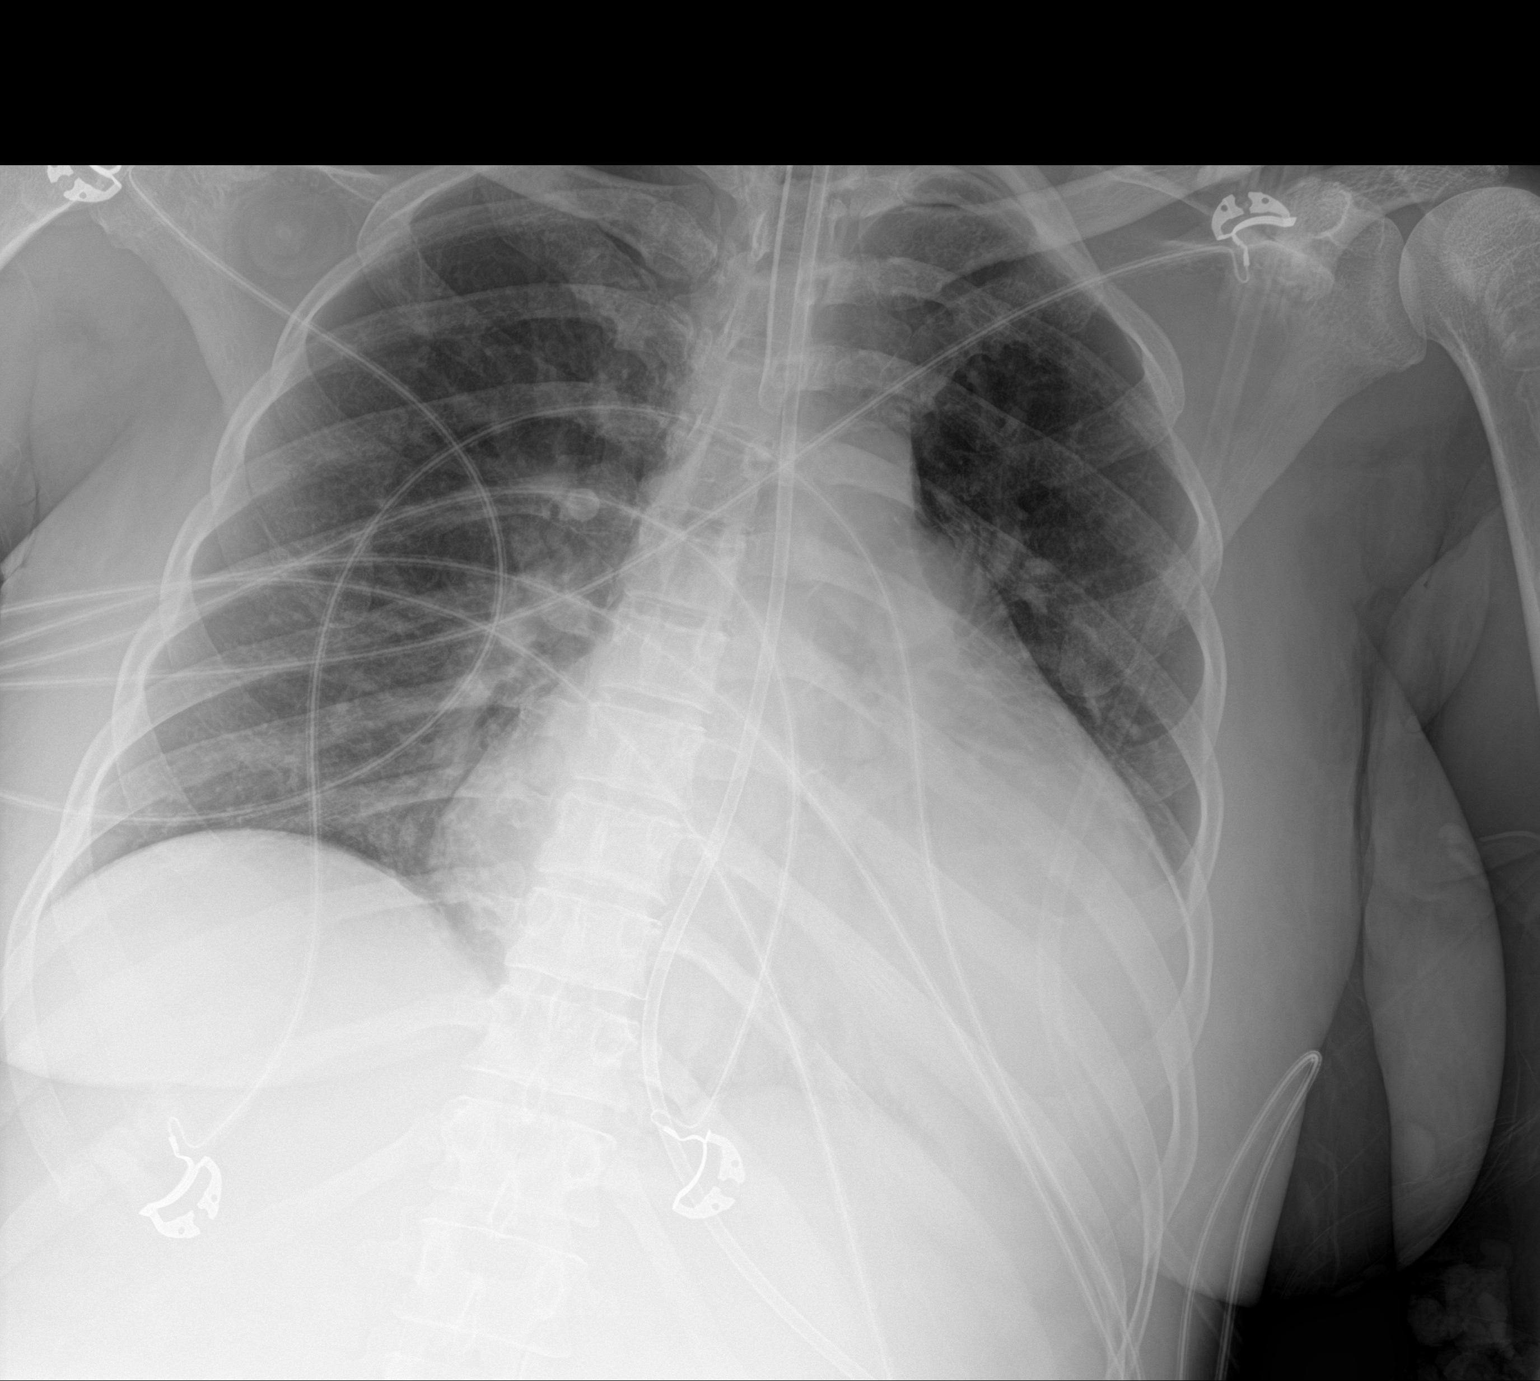

[1 of 1 positions shown; findings below may reference images not displayed]

FINDINGS: Endotracheal tube is seen 3.9 cm above the carina. Nasoenteric
feeding tube extends into the upper abdomen beyond the margin of the
examination. Left basilar opacification may reflect the presence of
a small left pleural effusion. The lungs are otherwise clear. No
pneumothorax. No pleural effusion on the right. Mild to moderate
cardiomegaly is unchanged. The pulmonary vascularity is normal. No
acute bone abnormality.
IMPRESSION: Support tubes in appropriate position.

Possible small left pleural effusion.  Stable cardiomegaly.

## 2021-05-17 IMAGING — DX DG CHEST 1V PORT
1 series · 1 of 1 positions shown · non-contrast
Comparison: Chest radiograph dated [DATE].

CLINICAL DATA: 45-year-old female status post intubation

EXAM:
PORTABLE CHEST 1 VIEW

[chest ap]
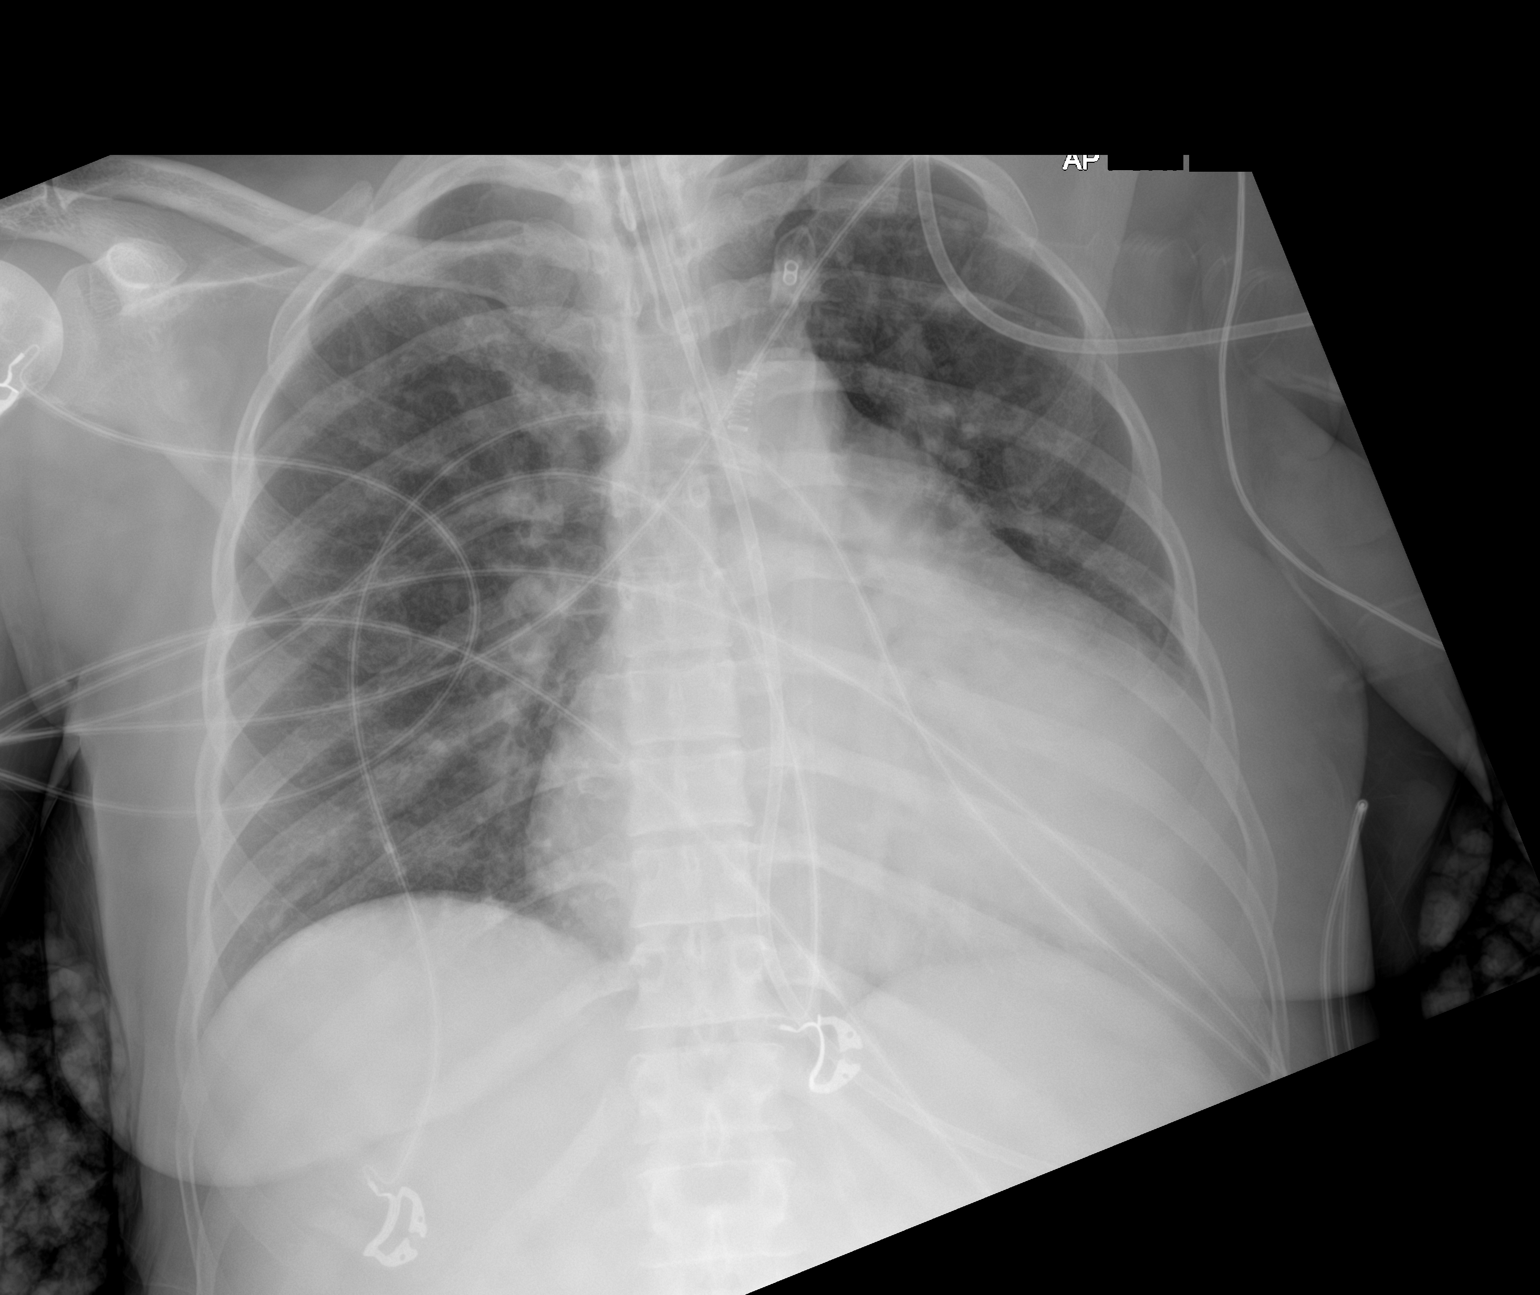

[1 of 1 positions shown; findings below may reference images not displayed]

FINDINGS: Endotracheal tube with tip approximately 4.7 cm above the carina.
Feeding tube extends below the diaphragm with tip beyond the
inferior margin of the image. There is cardiomegaly with mild
vascular congestion. No focal consolidation, pleural effusion, or
pneumothorax. No acute osseous pathology.
IMPRESSION: 1. Endotracheal tube above the carina.
2. Cardiomegaly with mild vascular congestion.

## 2021-05-17 IMAGING — CT CT ABD-PEL WO/W CM
3 of 9 series · 14 of 46 positions shown, 16 images · IV contrast (APPLIED)
Comparison: None.

CLINICAL DATA: Abdominal distention with pain.

EXAM:
CT ABDOMEN AND PELVIS WITHOUT AND WITH CONTRAST
TECHNIQUE: Multidetector CT imaging of the abdomen and pelvis was performed
following the standard protocol before and following the bolus
administration of intravenous contrast.
CONTRAST:  75mL OMNIPAQUE IOHEXOL 300 MG/ML  SOLN

[Series 4: abd/ pelvis 5.0 i30f 2 · axial · 0.76mm/px · z∈[+633,+1013]mm · 9 of 96 slices shown, 11 images]
[im 10/96  soft-tissue]
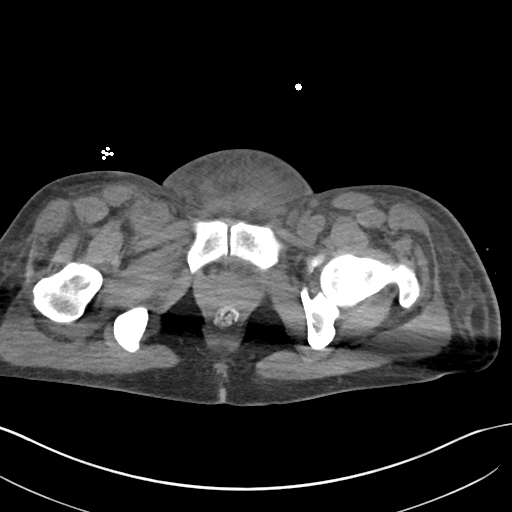
[im 10/96  bone]
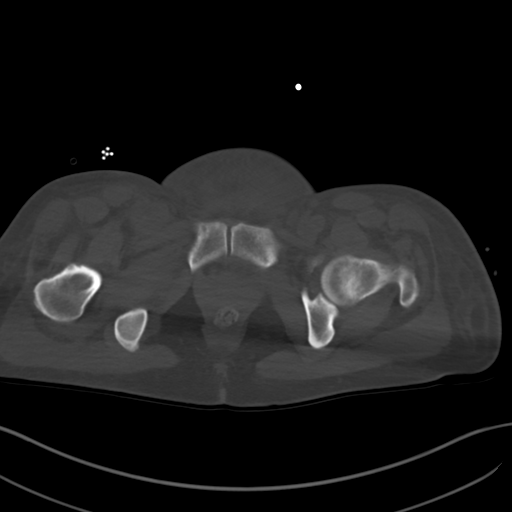
[im 20/96  soft-tissue]
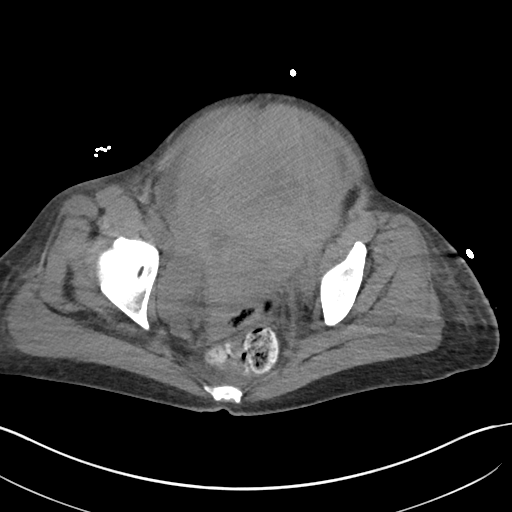
[im 29/96  soft-tissue]
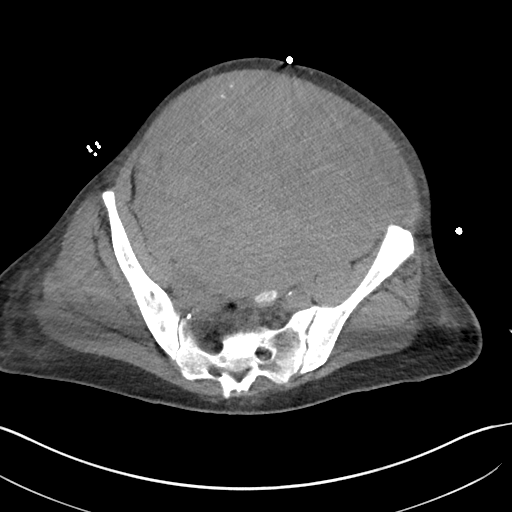
[im 39/96  soft-tissue]
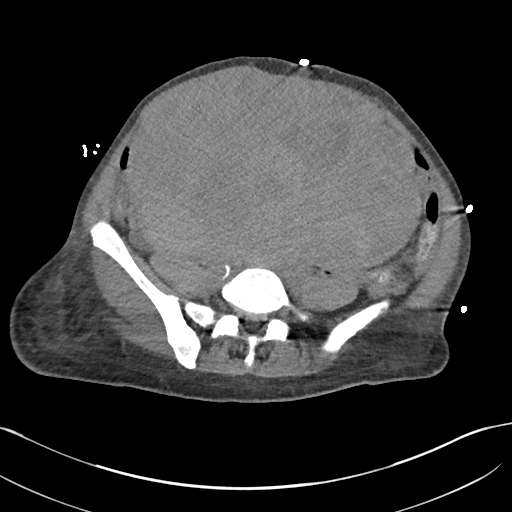
[im 48/96  soft-tissue]
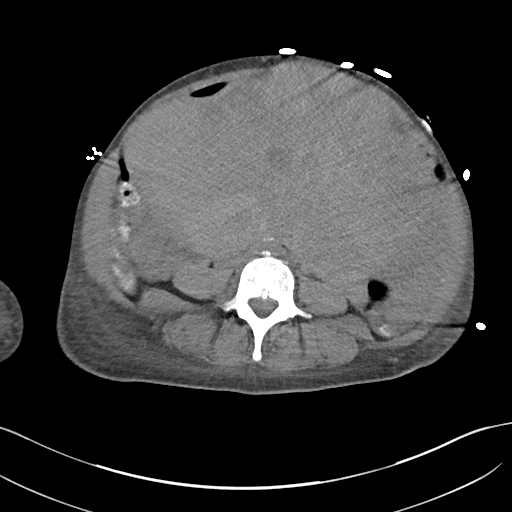
[im 58/96  soft-tissue]
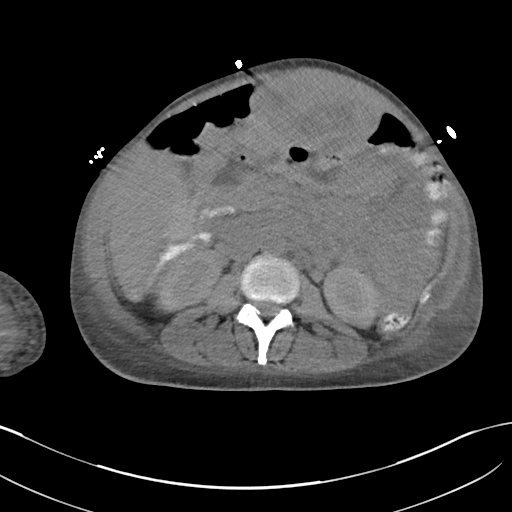
[im 67/96  soft-tissue]
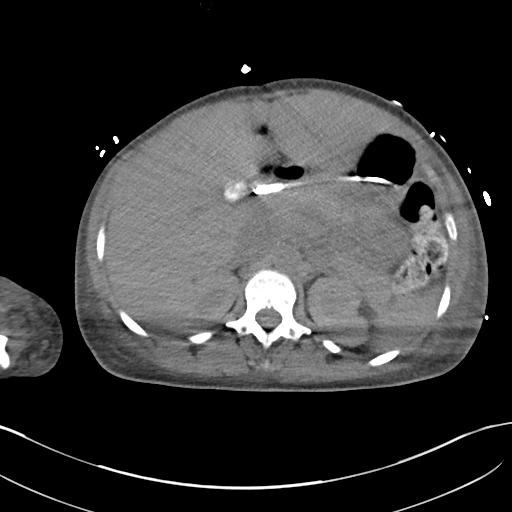
[im 77/96  soft-tissue]
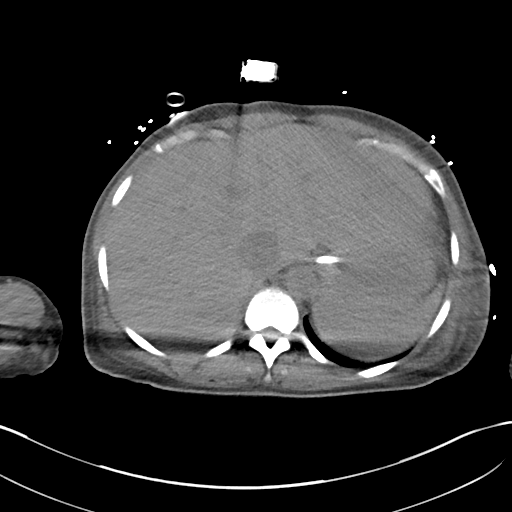
[im 86/96  soft-tissue]
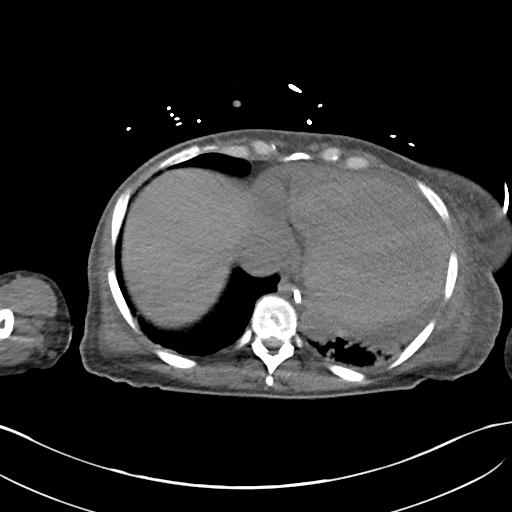
[im 86/96  bone]
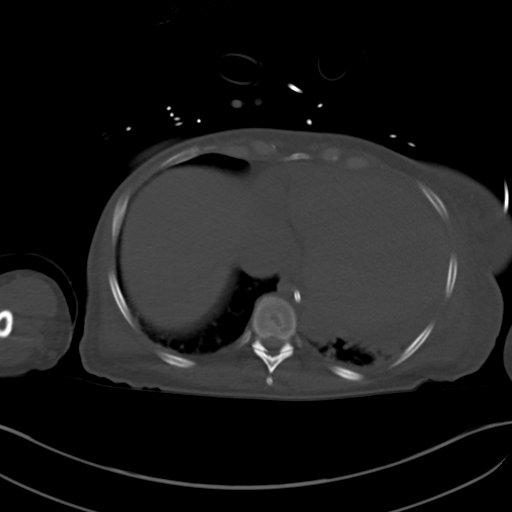

[Series 5: lung bases · axial · 0.76mm/px · z∈[+963,+1013]mm · 2 of 31 slices shown]
[im 11/31  bone]
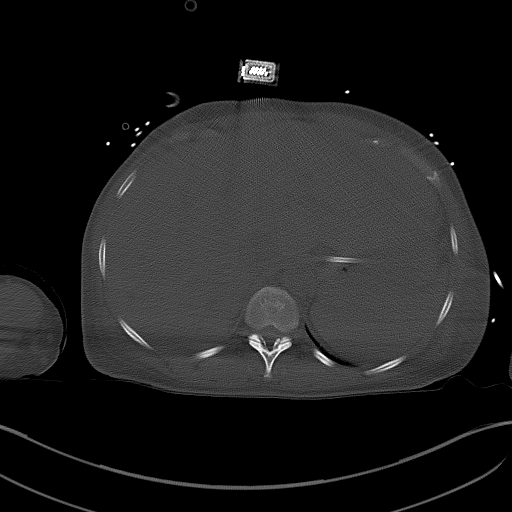
[im 21/31  bone]
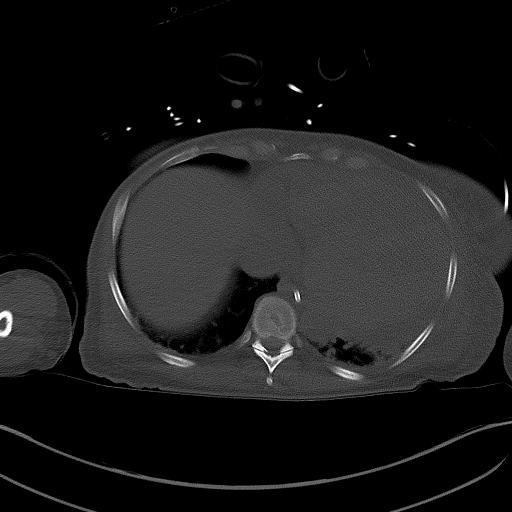

[Series 7: cor st · coronal · 0.70mm/px · 3 of 101 slices shown]
[im 26/101  soft-tissue]
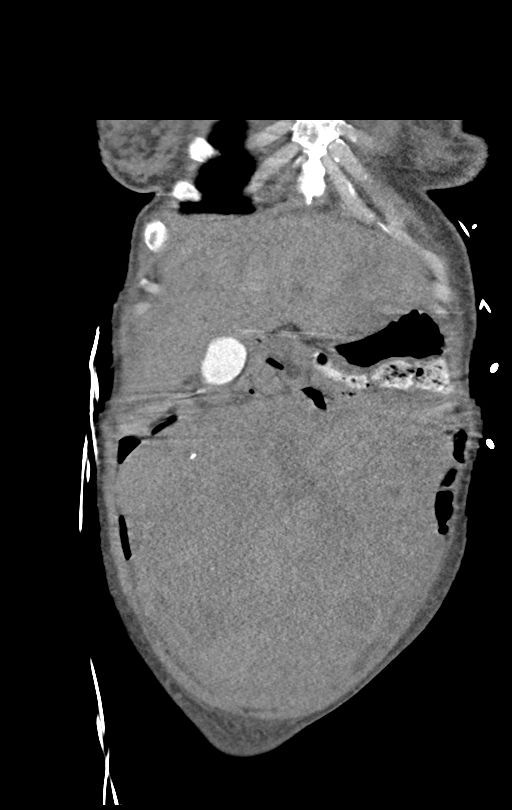
[im 51/101  soft-tissue]
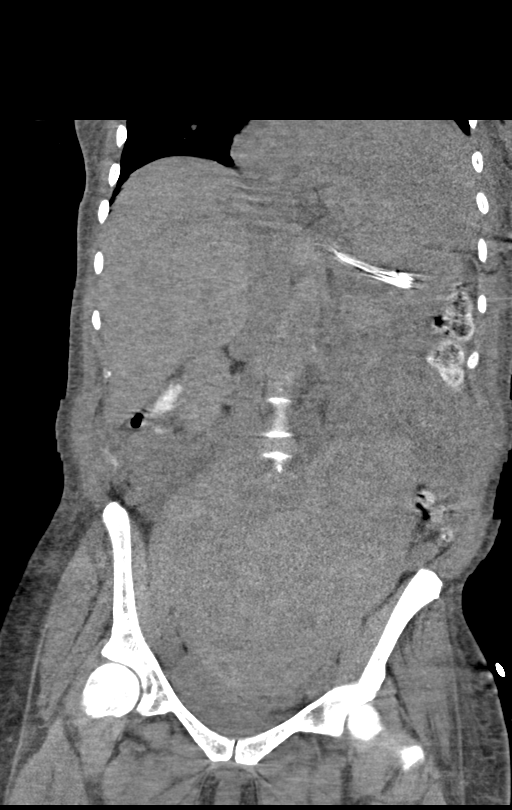
[im 76/101  soft-tissue]
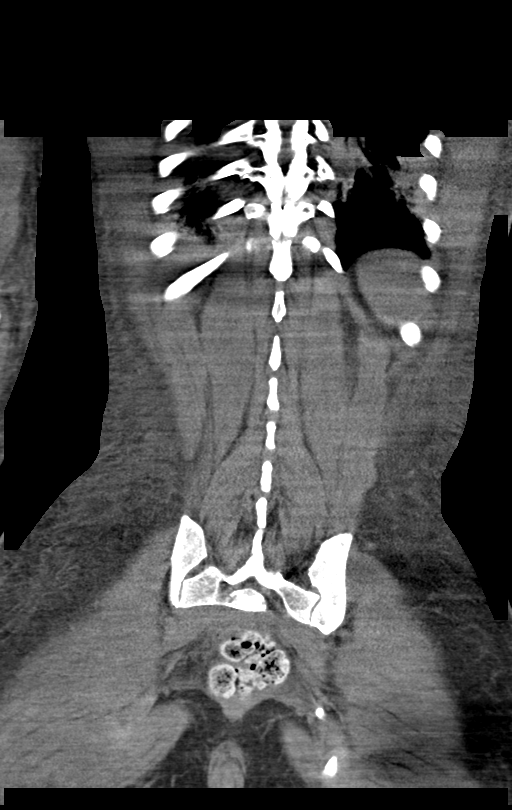

[14 of 46 positions shown; findings below may reference images not displayed]

FINDINGS: Lower chest: Marked cardiomegaly with left base
collapse/consolidation.

Hepatobiliary: Liver measures 20 cm craniocaudal length, enlarged. 7
mm hypodensity in the posterior right liver is too small to
characterize. High density material in the gallbladder lumen likely
vicarious excretion of contrast material. Insert no biliary

Pancreas: No focal mass lesion. No dilatation of the main duct. No
intraparenchymal cyst. No peripancreatic edema.

Spleen: No splenomegaly. No focal mass lesion.

Adrenals/Urinary Tract: No adrenal nodule or mass. Cortical scarring
noted in both kidneys 4 mm nonobstructing stone noted lower pole
right kidney. Bladder appears to be decompressed.

Stomach/Bowel: Feeding tube tip is in the distal stomach, at the
pylorus. No small bowel dilatation. Small bowel and colon are
markedly displaced due to an extremely large abdominopelvic mass. No
colonic dilatation.

Vascular/Lymphatic: There is abdominal aortic atherosclerosis
without aneurysm. There is no gastrohepatic or hepatoduodenal
ligament lymphadenopathy. No retroperitoneal or mesenteric
lymphadenopathy. No pelvic sidewall lymphadenopathy.

Reproductive: 22.9 x 22.9 x 13.8 cm soft tissue mass fills the
pelvis and lower abdomen. This has numerous round areas of
hypoperfusion scattered throughout. Imaging appearance is similar to
prior. Origin of this lesion cannot be determined but may well be
uterine. Due to mass-effect, neither ovary is clearly identified.

Other: Trace free fluid is noted in the pelvis. [DATE] x 3.4 cm focus
of soft tissue attenuation is identified in the right abdomen on
image 37 of series 9, indeterminate but apparently present on the
prior study.

Musculoskeletal: There is some body wall edema in the abdomen and
pelvis. No worrisome lytic or sclerotic osseous abnormality.
IMPRESSION: 1. Extremely large abdominopelvic mass measuring up to 22.9 cm.
Origin of this lesion cannot be determined but may well be uterine.
Due to mass-effect, neither ovary is clearly identified.
2. No evidence for lymphadenopathy in the abdomen or pelvis.
3. Marked cardiomegaly with left base collapse/consolidation.
4. 4 mm nonobstructing stone lower pole right kidney.
5. Aortic Atherosclerosis ([1C]-[1C]).

## 2021-05-17 MED ORDER — PROPOFOL 1000 MG/100ML IV EMUL: 5.0000 ug/kg/min | INTRAVENOUS | Status: DC

## 2021-05-17 MED ORDER — PANTOPRAZOLE SODIUM 40 MG IV SOLR
40.0000 mg | Freq: Every day | INTRAVENOUS | Status: DC
  Administered 2021-05-17: 40 mg via INTRAVENOUS
  Filled 2021-05-17: qty 40

## 2021-05-17 MED ORDER — MIDAZOLAM HCL 2 MG/2ML IJ SOLN: 2.0000 mg | Freq: Once | INTRAMUSCULAR | Status: AC

## 2021-05-17 MED ORDER — CHLORHEXIDINE GLUCONATE 0.12 % MT SOLN
15.0000 mL | Freq: Two times a day (BID) | OROMUCOSAL | Status: DC
  Administered 2021-05-17 (×2): 15 mL via OROMUCOSAL

## 2021-05-17 MED ORDER — FENTANYL CITRATE (PF) 100 MCG/2ML IJ SOLN
100.0000 ug | Freq: Once | INTRAMUSCULAR | Status: AC
Start: 2021-05-17 — End: 2021-05-17

## 2021-05-17 MED ORDER — DOCUSATE SODIUM 50 MG/5ML PO LIQD
100.0000 mg | Freq: Two times a day (BID) | ORAL | Status: DC
  Administered 2021-05-17 – 2021-05-25 (×12): 100 mg
  Filled 2021-05-17 (×15): qty 10

## 2021-05-17 MED ORDER — FENTANYL BOLUS VIA INFUSION
50.0000 ug | INTRAVENOUS | Status: DC | PRN
  Administered 2021-05-18 – 2021-05-19 (×2): 50 ug via INTRAVENOUS
  Filled 2021-05-17: qty 100

## 2021-05-17 MED ORDER — SODIUM CHLORIDE 0.9 % IV BOLUS
500.0000 mL | Freq: Once | INTRAVENOUS | Status: AC
  Administered 2021-05-17: 500 mL via INTRAVENOUS

## 2021-05-17 MED ORDER — ROCURONIUM BROMIDE 50 MG/5ML IV SOLN
50.0000 mg | Freq: Once | INTRAVENOUS | Status: AC
  Filled 2021-05-17: qty 5

## 2021-05-17 MED ORDER — IOHEXOL 300 MG/ML  SOLN
75.0000 mL | Freq: Once | INTRAMUSCULAR | Status: AC | PRN
  Administered 2021-05-17: 75 mL via INTRAVENOUS

## 2021-05-17 MED ORDER — AMLODIPINE BESYLATE 5 MG PO TABS: 5.0000 mg | ORAL_TABLET | Freq: Every day | ORAL | Status: DC

## 2021-05-17 MED ORDER — POLYETHYLENE GLYCOL 3350 17 G PO PACK
17.0000 g | PACK | Freq: Every day | ORAL | Status: DC
  Administered 2021-05-17 – 2021-05-25 (×7): 17 g
  Filled 2021-05-17 (×8): qty 1

## 2021-05-17 MED ORDER — FENTANYL 2500MCG IN NS 250ML (10MCG/ML) PREMIX INFUSION: 0.0000 ug/h | INTRAVENOUS | Status: DC

## 2021-05-17 MED ORDER — ROCURONIUM BROMIDE 10 MG/ML (PF) SYRINGE
PREFILLED_SYRINGE | INTRAVENOUS | Status: AC
  Administered 2021-05-17: 50 mg via INTRAVENOUS
  Filled 2021-05-17: qty 10

## 2021-05-17 MED ORDER — PROPOFOL 1000 MG/100ML IV EMUL
INTRAVENOUS | Status: AC
  Administered 2021-05-17: 40 ug/kg/min via INTRAVENOUS
  Filled 2021-05-17: qty 100

## 2021-05-17 MED ORDER — AMIODARONE HCL 200 MG PO TABS: 200.0000 mg | ORAL_TABLET | Freq: Every day | ORAL | Status: DC

## 2021-05-17 MED ORDER — PHENYLEPHRINE 40 MCG/ML (10ML) SYRINGE FOR IV PUSH (FOR BLOOD PRESSURE SUPPORT)
PREFILLED_SYRINGE | INTRAVENOUS | Status: AC
  Filled 2021-05-17: qty 10

## 2021-05-17 MED ORDER — AMLODIPINE BESYLATE 5 MG PO TABS
5.0000 mg | ORAL_TABLET | Freq: Every day | ORAL | Status: DC
  Administered 2021-05-17: 5 mg
  Filled 2021-05-17: qty 1

## 2021-05-17 MED ORDER — ETOMIDATE 2 MG/ML IV SOLN: 40.0000 mg | Freq: Once | INTRAVENOUS | Status: AC

## 2021-05-17 MED ORDER — MIDAZOLAM HCL 2 MG/2ML IJ SOLN
INTRAMUSCULAR | Status: AC
  Administered 2021-05-17: 2 mg via INTRAVENOUS
  Filled 2021-05-17: qty 2

## 2021-05-17 MED ORDER — METOPROLOL TARTRATE 50 MG PO TABS
100.0000 mg | ORAL_TABLET | Freq: Two times a day (BID) | ORAL | Status: DC
  Administered 2021-05-17: 100 mg via ORAL
  Filled 2021-05-17: qty 2

## 2021-05-17 MED ORDER — FENTANYL 2500MCG IN NS 250ML (10MCG/ML) PREMIX INFUSION
50.0000 ug/h | INTRAVENOUS | Status: DC
  Administered 2021-05-18: 175 ug/h via INTRAVENOUS
  Administered 2021-05-19: 75 ug/h via INTRAVENOUS
  Filled 2021-05-17 (×2): qty 250

## 2021-05-17 MED ORDER — HYDRALAZINE HCL 50 MG PO TABS
100.0000 mg | ORAL_TABLET | Freq: Three times a day (TID) | ORAL | Status: DC
  Administered 2021-05-17 (×2): 100 mg via ORAL
  Filled 2021-05-17 (×3): qty 2

## 2021-05-17 MED ORDER — ETOMIDATE 2 MG/ML IV SOLN
INTRAVENOUS | Status: AC
  Administered 2021-05-17: 40 mg via INTRAVENOUS
  Filled 2021-05-17: qty 20

## 2021-05-17 MED ORDER — ATORVASTATIN CALCIUM 10 MG PO TABS: 20.0000 mg | ORAL_TABLET | Freq: Every day | ORAL | Status: DC

## 2021-05-17 MED ORDER — PROPOFOL 1000 MG/100ML IV EMUL
0.0000 ug/kg/min | INTRAVENOUS | Status: DC
  Administered 2021-05-17: 50 ug/kg/min via INTRAVENOUS
  Administered 2021-05-18: 40 ug/kg/min via INTRAVENOUS
  Administered 2021-05-18: 5 ug/kg/min via INTRAVENOUS
  Filled 2021-05-17 (×4): qty 100

## 2021-05-17 MED ORDER — FENTANYL CITRATE (PF) 100 MCG/2ML IJ SOLN
INTRAMUSCULAR | Status: AC
  Administered 2021-05-17: 100 ug via INTRAVENOUS
  Filled 2021-05-17: qty 2

## 2021-05-17 MED ORDER — ORAL CARE MOUTH RINSE
15.0000 mL | Freq: Two times a day (BID) | OROMUCOSAL | Status: DC
  Administered 2021-05-17 (×2): 15 mL via OROMUCOSAL

## 2021-05-17 NOTE — Procedures (Signed)
Intubation Procedure Note  Holly Bradley  [id number]  11/13/1976  Date:05/17/21  Time:5:18 PM   Provider Performing:Stella Bortle Ruthann Cancer    Procedure: Intubation (31500)  Indication(s) Respiratory Failure  Consent Risks of the procedure as well as the alternatives and risks of each were explained to the patient and/or caregiver.  Consent for the procedure was obtained and is signed in the bedside chart   Anesthesia Etomidate, Versed, Fentanyl and see mar for complete details   Time Out Verified patient identification, verified procedure, site/side was marked, verified correct patient position, special equipment/implants available, medications/allergies/relevant history reviewed, required imaging and test results available.   Sterile Technique Usual hand hygeine, masks, and gloves were used   Procedure Description Patient positioned in bed supine.  Sedation given as noted above.  Patient was intubated with endotracheal tube using Glidescope. Mac 4  View was Grade 1 full glottis .  Number of attempts was 1.  Colorimetric CO2 detector was consistent with tracheal placement.   Complications/Tolerance None; patient tolerated the procedure well. Chest X-ray is ordered to verify placement.   EBL none   Specimen(s) None

## 2021-05-17 NOTE — Progress Notes (Signed)
[facility] Progress Note [name] DOB: [birth year] MRN: [id number]   Date of Service  05/17/2021  HPI/Events of Note  Serum Sodium 158 meq / L.  eICU Interventions  3 % Saline gtt rate reduced to 30 ml / hour.        Frederik Pear 05/17/2021, 10:38 PM

## 2021-05-17 NOTE — Consult Note (Signed)
Physical Medicine and Rehabilitation Consult Reason for Consult: Altered mental status with left-sided weakness and expressive aphasia Referring Physician: Dr. Leonel Ramsay   HPI: Holly Bradley is a 45 y.o. right-handed female with history of hypertension, tobacco use, diastolic congestive heart failure, paroxysmal SVT, paranoid psychosis.  Per chart review lives with her brother.  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  Two-level home bed and bath upstairs.  Presented 05/15/2021 with left-sided weakness and aphasia.  Elevated SBP 200s.  Cranial CT scan showed acute appearing perforator infarction of the right basal ganglia.  CT angiogram head and neck emergent large vessel occlusion at the right M1 origin.  Left vertebral occlusion at its origin with reconstitution by the V4 segment.  Retropharyngeal fluid and subcutaneous intramuscular edema in the left neck.  Patient underwent emergent revascularization of the right MCA per interventional radiology but parietal branch and subarachnoid hemorrhage and that branch had to be sacrificed with vessel coiling.  MRI follow-up shows moderately large acute right MCA infarction associated petechial hemorrhage subarachnoid hemorrhage and minimal intraventricular hemorrhage.  Echocardiogram with ejection fraction of 55 to 68% grade 2 diastolic dysfunction.  Admission chemistries unremarkable except potassium 3.3, creatinine 1.27, alcohol negative, ammonia less than 9, urine drug screen negative, hemoglobin 8.0.  Patient currently n.p.o. with alternative means of nutritional support.  Cleviprex ongoing for blood pressure management.  Follow-up CT scan 05/16/2021 shows continued interval evolution of a moderately large right MCA distribution associated regional mass-effect and a new 2 mm right to left midline shift without hydrocephalus and 3% hypertonic saline initiated.  Therapy evaluations completed due to patient's left-sided weakness and  speech difficulties recommendations of physical medicine rehab consult.   Review of Systems  Constitutional: Negative for chills and fever.  HENT: Negative for hearing loss.   Eyes: Negative for blurred vision and double vision.  Respiratory: Negative for cough and shortness of breath.   Cardiovascular: Negative for chest pain and palpitations.  Gastrointestinal: Positive for constipation. Negative for heartburn, nausea and vomiting.  Genitourinary: Negative for dysuria, flank pain and hematuria.  Musculoskeletal: Positive for myalgias.  Skin: Negative for rash.  Neurological: Positive for speech change and weakness.  All other systems reviewed and are negative.  Past Medical History:  Diagnosis Date  . Hypertension    Past Surgical History:  Procedure Laterality Date  . IR ANGIOGRAM FOLLOW UP STUDY  05/15/2021  . IR CT HEAD LTD  05/15/2021  . IR PERCUTANEOUS ART THROMBECTOMY/INFUSION INTRACRANIAL INC DIAG ANGIO  05/15/2021      . IR PERCUTANEOUS ART THROMBECTOMY/INFUSION INTRACRANIAL INC DIAG ANGIO  05/15/2021  . IR TRANSCATH/EMBOLIZ  05/15/2021  . RADIOLOGY WITH ANESTHESIA N/A 05/15/2021   Procedure: IR WITH ANESTHESIA;  Surgeon: Radiologist, Medication, MD;  Location: [place];  Service: Radiology;  Laterality: N/A;   History reviewed. No pertinent family history. Social History:  reports that she has been smoking. She has never used smokeless tobacco. No history on file for alcohol use and drug use. Allergies: No Known Allergies Medications Prior to Admission  Medication Sig Dispense Refill  . amiodarone (PACERONE) 200 MG tablet Take 200 mg by mouth daily.    Marland Kitchen ENTRESTO 97-103 MG Take 1 tablet by mouth 2 (two) times daily.    . FEROSUL 325 (65 Fe) MG tablet Take 325 mg by mouth 2 (two) times daily.    . hydrALAZINE (APRESOLINE) 100 MG tablet Take 100 mg by mouth 3 (three) times daily.    . isosorbide  mononitrate (IMDUR) 60 MG 24 hr tablet Take 60 mg by mouth daily.    Marland Kitchen KLOR-CON  M20 20 MEQ tablet Take 40 mEq by mouth 2 (two) times daily.    . megestrol (MEGACE) 20 MG tablet Take 40 mg by mouth in the morning and at bedtime. May increase to 4 tablets ( 80 mg) twice daily in the event of heavy breathing    . metoprolol (TOPROL-XL) 200 MG 24 hr tablet Take 200 mg by mouth daily.    Marland Kitchen torsemide (DEMADEX) 20 MG tablet Take 40 mg by mouth 2 (two) times daily.      Home: Home Living Family/patient expects to be discharged to:: Private residence Living Arrangements: Other relatives (brother) Available Help at Discharge: Family Type of Home: House Home Access: Stairs to enter Technical brewer of Steps: 1 (through garage) Home Layout: Two level,Bed/bath upstairs,1/2 bath on main level Alternate Level Stairs-Number of Steps:  (flight) Bathroom Shower/Tub: Tub/shower unit Home Equipment: None Additional Comments: 1/2 bath on main floor  Lives With: Other (Comment) (roommates?)  Functional History: Prior Function Level of Independence: Independent Comments: works in Engineer, maintenance Status:  Mobility: Bed Mobility Overal bed mobility: Needs Assistance Bed Mobility: Rolling,Supine to Sit,Sit to Supine Rolling: Mod assist Supine to sit: +2 for physical assistance,Max assist Sit to supine: +2 for physical assistance,Mod assist General bed mobility comments: pt requires (A) to sequence task and push up into static sitting. pt requires total (A) at EOB Pt with L UE subluxation noted. Pt pushing with R UE toward L side. Pt requires cues for alignment. pt with neck flexion and does initiate extension with cue. pt requires control to lower trunk to bed surface and elevate bil LE Transfers General transfer comment: NT      ADL: ADL Overall ADL's : Needs assistance/impaired Eating/Feeding: NPO Upper Body Dressing : Maximal assistance Lower Body Dressing: Total assistance General ADL Comments: pt with visual deficits noted and needs (A) to static sit EOB.  pt demonstrates pushign with R UE and extension  Cognition: Cognition Overall Cognitive Status: Difficult to assess Arousal/Alertness: Lethargic Orientation Level: Oriented to person,Oriented to place,Disoriented to time,Disoriented to situation Attention: Focused Focused Attention: Impaired Focused Attention Impairment: Verbal basic Memory:  (TBA) Awareness: Impaired Awareness Impairment: Emergent impairment Problem Solving:  (will assess as alertness improves) Safety/Judgment: Impaired Cognition Arousal/Alertness: Awake/alert Behavior During Therapy: WFL for tasks assessed/performed Overall Cognitive Status: Difficult to assess Area of Impairment: Following commands Following Commands: Follows multi-step commands inconsistently General Comments: pt smiling responding to therapist. pt with dysarthria but states she is thirsty and wants water . water was very clearly expressed. pt likes 90s music and nodding head to brother telling therapist Difficult to assess due to: Impaired communication  Blood pressure (!) 150/81, pulse 94, temperature 99.6 F (37.6 C), temperature source Axillary, resp. rate 18, height '5\' 5"'  (1.651 m), weight 72.6 kg, SpO2 98 %. Physical Exam Constitutional:      Appearance: She is ill-appearing.  HENT:     Nose:     Comments: NGT    Mouth/Throat:     Pharynx: Oropharyngeal exudate present.  Eyes:     Comments: Eyes closed. Fixed gaze to right  Cardiovascular:     Rate and Rhythm: Normal rate and regular rhythm.  Pulmonary:     Effort: Pulmonary effort is normal.  Abdominal:     General: There is no distension.     Palpations: Abdomen is soft.  Musculoskeletal:  General: No swelling.  Skin:    General: Skin is warm.  Neurological:     Comments: Eyes closed. Can open with cueing. Fixed gaze to right. Head turned to right. Right central 7, speech very dysarthric. Could follow simple one step commands and tell me she was in hospital. LUE 0/5,  LLE 0/5 with significant clonus. RUE 5/5. RLE 4-5 with clonic movements also. Withdrew somewhat to pinch LUE and LLE. DTR's 3+ on left  Psychiatric:     Comments: Flat, lethargic     Results for orders placed or performed during the hospital encounter of 05/15/21 (from the past 24 hour(s))  Sodium     Status: None   Collection Time: 05/16/21 10:56 AM  Result Value Ref Range   Sodium 140 135 - 145 mmol/L  Sodium     Status: None   Collection Time: 05/16/21  4:00 PM  Result Value Ref Range   Sodium 143 135 - 145 mmol/L  Glucose, capillary     Status: Abnormal   Collection Time: 05/16/21  8:32 PM  Result Value Ref Range   Glucose-Capillary 132 (H) 70 - 99 mg/dL  Glucose, capillary     Status: Abnormal   Collection Time: 05/16/21 11:31 PM  Result Value Ref Range   Glucose-Capillary 157 (H) 70 - 99 mg/dL  Sodium     Status: None   Collection Time: 05/17/21  1:34 AM  Result Value Ref Range   Sodium 145 135 - 145 mmol/L  Glucose, capillary     Status: Abnormal   Collection Time: 05/17/21  3:49 AM  Result Value Ref Range   Glucose-Capillary 131 (H) 70 - 99 mg/dL  Basic metabolic panel     Status: Abnormal   Collection Time: 05/17/21  4:15 AM  Result Value Ref Range   Sodium 150 (H) 135 - 145 mmol/L   Potassium 3.5 3.5 - 5.1 mmol/L   Chloride 123 (H) 98 - 111 mmol/L   CO2 16 (L) 22 - 32 mmol/L   Glucose, Bld 127 (H) 70 - 99 mg/dL   BUN 14 6 - 20 mg/dL   Creatinine, Ser 1.48 (H) 0.44 - 1.00 mg/dL   Calcium 8.6 (L) 8.9 - 10.3 mg/dL   GFR, Estimated 44 (L) >60 mL/min   Anion gap 11 5 - 15  Magnesium     Status: None   Collection Time: 05/17/21  4:15 AM  Result Value Ref Range   Magnesium 2.0 1.7 - 2.4 mg/dL  Phosphorus     Status: None   Collection Time: 05/17/21  4:15 AM  Result Value Ref Range   Phosphorus 3.3 2.5 - 4.6 mg/dL   CT HEAD WO CONTRAST  Result Date: 05/16/2021 CLINICAL DATA:  Follow-up examination for acute stroke. EXAM: CT HEAD WITHOUT CONTRAST TECHNIQUE:  Contiguous axial images were obtained from the base of the skull through the vertex without intravenous contrast. COMPARISON:  Prior CT and MRI from 05/15/2021. FINDINGS: Brain: Examination degraded by motion artifact and patient positioning. There has been continued interval evolution of moderately large right MCA distribution infarct, grossly stable in size and distribution from recent MRI. Scattered areas of contrast staining within the area of infarction has markedly decreased and nearly resolved. Persistent subarachnoid hemorrhage at the posterior right sylvian fissure and overlying right frontoparietal region, decreased in prominence from previous, likely reflecting decreased contrast staining with some degree of resorption of blood. Streak artifact from superimposed embolization material at the peripheral right frontoparietal region again noted. Trace  intraventricular hemorrhage related to redistribution again noted. There is increased regional mass effect with sulcal effacement within the area of infarction, with mildly increased mass effect and effacement of the right lateral ventricle. 2 mm of right-to-left shift is now evident. Basilar cisterns remain patent. No hydrocephalus or trapping. There is an apparent new area of parenchymal hypodensity involving the lateral left cerebellum (series 3, image 8). While this finding is favored to be artifactual, a possible new acute ischemic infarct is difficult to exclude, and could be considered in the correct clinical setting. Area of involvement measures approximately 2.4 cm. No other acute intracranial abnormality. Chronic left cerebellar infarct again noted. Underlying atrophy with advanced chronic microvascular ischemic disease with multiple additional remote lacunar infarcts noted as well. No extra-axial collection. Vascular: No visible new or recurrent hyperdense vessel. Calcified atherosclerosis present at the skull base. Skull: No new scalp soft tissue  abnormality.  She calvarium intact. Sinuses/Orbits: Globes and orbital soft tissues demonstrate no new obvious abnormality, although evaluation limited by motion. Paranasal sinuses remain clear. No mastoid effusion. Other: None. IMPRESSION: 1. Continued interval evolution of moderately large right MCA distribution infarct. Associated regional mass effect has mildly increased, with new 2 mm of right-to-left midline shift. No hydrocephalus or trapping. 2. Decreased prominence of subarachnoid hemorrhage at the posterior right Sylvian fissure and overlying right frontoparietal region. Trace intraventricular hemorrhage related to redistribution. 3. Question new area of parenchymal hypodensity involving the lateral left cerebellum. While this finding is favored to be artifactual, a possible new acute ischemic infarct is difficult to exclude, and could be considered in the correct clinical setting. Attention at follow-up recommended. 4. No other new acute intracranial abnormality. Electronically Signed   By: Jeannine Boga M.D.   On: 05/16/2021 02:55   CT HEAD WO CONTRAST  Addendum Date: 05/15/2021   ADDENDUM REPORT: 05/15/2021 17:13 ADDENDUM: These results were called by telephone at the time of interpretation on 05/15/2021 at 3:05 pm to provider Dr. Leonel Ramsay, who verbally acknowledged these results. Electronically Signed   By: Kellie Simmering DO   On: 05/15/2021 17:13   Result Date: 05/15/2021 CLINICAL DATA:  Stroke, follow-up; small contrast extravasates post MT. Contrast staining basal ganglia and frontal cortex. EXAM: CT HEAD WITHOUT CONTRAST TECHNIQUE: Contiguous axial images were obtained from the base of the skull through the vertex without intravenous contrast. COMPARISON:  Noncontrast head CT, CT angiogram head/neck and CT perfusion performed earlier today. Procedure report from endovascular revascularization and embolization performed earlier today 05/15/2021. FINDINGS: Brain: Mild cerebral and  cerebellar atrophy. Small to moderate volume hyperdensity within the right sylvian fissure and along portions of the right frontoparietal and temporal lobes compatible with extravasated contrast/subarachnoid hemorrhage. There is hyperdensity within the right basal ganglia, right insula, lateral right frontal lobe/frontal operculum and within portions of the right parietal lobe likely reflecting contrast staining at sites of acute infarction. Associated mass effect with subtle partial effacement of the right lateral ventricle. No midline shift at this time. No evidence of hydrocephalus. Background patchy and ill-defined hypoattenuation within the cerebral white matter, nonspecific but compatible with chronic small vessel ischemic disease. Redemonstrated chronic lacunar infarcts within the left basal ganglia. Redemonstrated chronic infarct within the superior left cerebellar hemisphere. Vascular: Residual circulating contrast limits evaluation for hyperdense vessels. Atherosclerotic calcifications. Embolization material is now present overlying the lateral right parietal lobe, consistent with the procedural history describing embolization of a right M3 branch. Skull: Normal. Negative for fracture or focal lesion. Sinuses/Orbits: Visualized orbits show no acute  finding. Trace bilateral ethmoid sinus mucosal thickening. Other: Redemonstrated left maxillofacial soft tissue swelling. IMPRESSION: Small-to-moderate volume hyperdensity within the right sylvian fissure and along portions of the right frontoparietal and temporal lobes compatible with extravasated contrast/subarachnoid hemorrhage. There is hyperdensity within the right basal ganglia, right insula, lateral right frontal lobe/frontal operculum and within portions of the right parietal lobe likely reflecting contrast staining at sites of acute infarction. Mild associated mass effect with subtle partial effacement of the right lateral ventricle. No midline shift or  hydrocephalus. Embolization material now overlies the lateral right parietal lobe. Stable background parenchymal atrophy and chronic small vessel ischemic disease. Redemonstrated chronic infarct within the superior left cerebellum. Electronically Signed: By: Kellie Simmering DO On: 05/15/2021 15:05   CT Angio Neck W and/or Wo Contrast  Result Date: 05/15/2021 CLINICAL DATA:  Code stroke. EXAM: CT ANGIOGRAPHY HEAD AND NECK CT PERFUSION BRAIN TECHNIQUE: Multidetector CT imaging of the head and neck was performed using the standard protocol during bolus administration of intravenous contrast. Multiplanar CT image reconstructions and MIPs were obtained to evaluate the vascular anatomy. Carotid stenosis measurements (when applicable) are obtained utilizing NASCET criteria, using the distal internal carotid diameter as the denominator. Multiphase CT imaging of the brain was performed following IV bolus contrast injection. Subsequent parametric perfusion maps were calculated using RAPID software. CONTRAST:  62m OMNIPAQUE IOHEXOL 350 MG/ML SOLN COMPARISON:  Head CT from earlier today FINDINGS: CTA NECK FINDINGS Aortic arch: Motion degraded.  No acute finding Right carotid system: Mild mixed density plaque at the bifurcation. No stenosis or ulceration. Left carotid system: Mixed density plaque at the bifurcation. No stenosis or ulceration. Vertebral arteries: No proximal subclavian stenosis. Low-density bulge into the left subclavian lumen at the expected location of the left vertebral origin, with left vertebral occlusion. There is V4 segment reconstitution. No flow limiting stenosis or dissection seen on the right. Skeleton: Retropharyngeal fluid. There is contusion like appearance of the left cheek and the left lateral neck. Muscles on the left lateral neck may be diffusely edematous and mildly swollen. Other neck: Subcentimeter nodules in the thyroid. No followup recommended (ref: J Am Coll Radiol. 2015 Feb;12(2):  143-50). Upper chest: Left supraclavicular stranding which is contiguous with the left neck edema. No evidence of air leak or contusion. Review of the MIP images confirms the above findings CTA HEAD FINDINGS Anterior circulation: Right M1 occlusion with abrupt cut off. Heavily calcified carotid siphons which limits detection of luminal stenosis. The right ICA is more heavily affected with high-grade narrowing possible at the petrous/cavernous junction and anterior genu. Hypoplastic right A1 segment. Atheromatous irregularity of MCA and ACA branches. Posterior circulation: Reconstituted left V4. Atheromatous plaque at the bilateral V4 segments. The basilar is smooth and widely patent. No PCA branch occlusion or aneurysm. Venous sinuses: Right dominant transverse sigmoid system. No acute thrombosis. Anatomic variants: As above Review of the MIP images confirms the above findings CT Brain Perfusion Findings: ASPECTS: 7 - 8 ( caudate and putamen infarct. There could be anterior insular infarct on the right, but similar to the left and indeterminate due to the degree of chronic changes). CBF (<30%) Volume: 558mPerfusion (Tmax>6.0s) volume: 10360mismatch Volume: 41m38mfarction Location:Lateral right frontal lobe and right basal ganglia Case discussed with Dr. KirkLeonel Ramsayle in progress. IMPRESSION: 1. Emergent large vessel occlusion at the right M1 origin. CT perfusion suggestive 50 cc core infarct and 50 cc of adjacent penumbra. 2. Left vertebral occlusion at its origin (where there is subclavian luminal  density from thrombus or plaque) with reconstitution by the V4 segment. 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 3. Retropharyngeal fluid and subcutaneous/intramuscular edema in the left neck. Given the history this may be from traumatic soft tissue injury or pressure necrosis. 4. Premature atherosclerosis and chronic small vessel disease. High-grade  narrowings of the right cavernous ICA which may be accentuated by underfilling from downstream occlusion and right A1 hypoplasia. Electronically Signed   By: Monte Fantasia M.D.   On: 05/15/2021 07:49   MR BRAIN WO CONTRAST  Result Date: 05/15/2021 CLINICAL DATA:  Stroke follow-up. EXAM: MRI HEAD WITHOUT CONTRAST TECHNIQUE: Multiplanar, multiecho pulse sequences of the brain and surrounding structures were obtained without intravenous contrast. COMPARISON:  Head CT 05/15/2021 and MRI 01/16/2021 FINDINGS: Brain: There is a moderately large acute right MCA territory infarct involving essentially the entirety of the basal ganglia, much of the insula, and portions of cortex and white matter in the temporal, frontal, and parietal lobes. There is associated petechial hemorrhage, and there is cytotoxic edema with regional sulcal effacement, partial effacement of the body and frontal horn of the right lateral ventricle, and trace leftward midline shift. As seen on the prior CT, there is also subarachnoid hemorrhage in the right sylvian fissure and right multiple cerebral sulci in the regions of infarct. A small amount of intraventricular hemorrhage is also noted in the occipital horns of the lateral ventricles and in the fourth ventricle. A background of confluent T2 hyperintensity is again seen in the cerebral white matter bilaterally, nonspecific but compatible with severe chronic small vessel ischemic disease. There are small chronic infarcts in the right corona radiata, bilateral basal ganglia, pons, and cerebellum. Scattered chronic microhemorrhages are noted in the cerebrum and cerebellum. There is mild cerebral atrophy. There is no extra-axial fluid collection. Vascular: Major intracranial vascular flow voids are preserved. Skull and upper cervical spine: Chronically diminished bone marrow T1 signal intensity diffusely likely related to chronic anemia. Sinuses/Orbits: Chronic bilateral proptosis. Paranasal  sinuses and mastoid air cells are clear. Other: None. IMPRESSION: 1. Moderately large acute right MCA infarct. Associated petechial hemorrhage, subarachnoid hemorrhage, and minimal intraventricular hemorrhage. 2. Severe chronic small vessel ischemic disease with multiple chronic infarcts. Electronically Signed   By: Logan Bores M.D.   On: 05/15/2021 16:12   MR CERVICAL SPINE WO CONTRAST  Result Date: 05/15/2021 CLINICAL DATA:  Acute or progressive myelopathy. Previous intervention for right M1 occlusion. EXAM: MRI CERVICAL SPINE WITHOUT CONTRAST TECHNIQUE: Multiplanar, multisequence MR imaging of the cervical spine was performed. No intravenous contrast was administered. COMPARISON:  None. FINDINGS: The study suffers from considerable motion degradation. Alignment: Normal Vertebrae: Normal Cord: The cord appears normal. No cord compression. No cord swelling or discernible abnormal T2 signal. Posterior Fossa, vertebral arteries, paraspinal tissues: Some edema in the prevertebral soft tissues. Question if this could be related to recent endotracheal intubation. One could not rule out the possibility of soft tissue neck infection. Disc levels: Somewhat limited detail because of motion. There does not appear to be any disc space pathology. Patient does have facet arthritis most notable on the right at C2-3, C3-4, C4-5 and C7-T1 and on the left at C7-T1. There does not appear to be pronounced encroachment upon the neural foramina. IMPRESSION: The cord itself appears normal. No disc disease. No spinal stenosis. 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 No significant encroachment upon the neural spaces. Some prevertebral soft tissue edema. In this clinical  scenario, this may be subsequent to previous endotracheal intubation. One could not rule out pharyngitis or cellulitis but that seems less likely. I do not see any traumatic injury to the cervical spine itself. Electronically Signed   By: Nelson Chimes  M.D.   On: 05/15/2021 16:05   IR Angiogram Follow Up Study  INDICATION: 45 year old female with past medical history significant for uncontrolled hypertension, tobacco use disorder, uterine fibroids, heart failure, paroxysmal SVT, paranoid psychosis presenting with altered mental status. Head CT showed hypodensity within the right basal ganglia concerning for possible right MCA occlusion. A CT angiogram of the head and neck was then obtained and showed an occlusion of the right M1/MCA segment. CT perfusion showed a core infarct of 51 mL with a penumbra of 52 mL. Case was discussed with patient's brother and decision was made to proceed with mechanical thrombectomy.  EXAM: DIAGNOSTIC CEREBRAL ANGIOGRAM  MECHANICAL THROMBECTOMY  FLAT PANEL HEAD CT  COMPARISON:  CT/CT angiogram of the head and neck May 15, 2021.  MEDICATIONS: 5 mg of verapamil intra arterial.  ANESTHESIA/SEDATION: Procedure was performed under general anesthesia.  CONTRAST:  75 mL of Omnipaque 240 milligram/mL  FLUOROSCOPY TIME:  Fluoroscopy Time: 21 minutes 24 seconds (1090 mGy).  COMPLICATIONS: SIR LEVEL C - Requires therapy, minor hospitalization (<48 hrs).  TECHNIQUE: Informed written consent was obtained from the patient's brother after a thorough discussion of the procedural risks, benefits and alternatives. All questions were addressed. Maximal Sterile Barrier Technique was utilized including caps, mask, sterile gowns, sterile gloves, sterile drape, hand hygiene and skin antiseptic. A timeout was performed prior to the initiation of the procedure.  The right groin was prepped and draped in the usual sterile fashion. Using a micropuncture kit and the modified Seldinger technique, access was gained to the right common femoral artery and an 8 French sheath was placed.  Under fluoroscopy, an 8 [place] Walrus balloon guide catheter was navigated over a 6 [place] Berenstein 2 catheter and a 0.035" Terumo Glidewire into the aortic arch.  The catheter was placed into the right common carotid artery and then advanced into the right internal carotid artery. The inner catheter was removed. Frontal and lateral angiograms of the head were obtained.  FINDINGS: 1. There is a mid right M1/MCA occlusion. 2. Atherosclerotic changes of the right carotid bifurcation without hemodynamically significant stenosis.  PROCEDURE: Under biplane roadmap, a zoom 71 aspiration catheter was navigated over a Aristotle 18 microguidewire into the cavernous segment of the right ICA. The aspiration catheter was advanced to the level of occlusion at the right M1 segment and connected to a penumbra aspiration pump. The guiding catheter balloon was inflated. The aspiration catheter was removed under constant aspiration.  Right internal carotid artery angiograms showed recanalization of the right M1 segment with occlusion of a distal M2/MCA posterior division branch.  Under biplane roadmap, a zoom 71 aspiration catheter was navigated over a Aristotle 18 microguidewire into the right M1/MCA. The aspiration catheter was advanced over the wire to the level of occlusion at the right M2 segment and connected to a penumbra aspiration pump. The guiding catheter balloon was inflated. The aspiration catheter was removed under constant aspiration.  Right internal carotid artery angiograms showed recanalization of the right M2 segment with occlusion of M3/MCA posterior division branches.  Under biplane roadmap, a zoom 71 aspiration catheter was navigated over a phenom 21 microcatheter and a Aristotle 18 microguidewire into the cavernous segment of the right ICA. The microcatheter was then navigated over  the wire into the right M3/MCA. Then, a 3 mm solitaire stent retriever was deployed spanning the M2-M3 segment. The device was allowed to intercalated with the clot for 4 minutes. The microcatheter was removed. The aspiration catheter was advanced to the level of occlusion and connected  to a penumbra aspiration pump. The guiding catheter balloon was inflated. The thrombectomy device and aspiration catheter were removed under constant aspiration.  Right internal carotid artery angiograms showed recanalization of the proximal right M3 segment with occlusion of distal M3/MCA posterior division branches.  Under biplane roadmap, a zoom 71 aspiration catheter was navigated over a phenom 21 microcatheter and a Aristotle 18 microguidewire into the cavernous segment of the right ICA. The microcatheter was then navigated over the wire into the right M3/MCA. Then, the microcatheter was connected to a penumbra aspiration pump. The guiding catheter balloon was inflated. The microcatheter was removed under constant aspiration.  Right internal carotid artery angiograms showed persistent occlusion of a distal M3/MCA posterior division branch.  Under biplane roadmap, a zoom 71 aspiration catheter was navigated over a phenom 21 microcatheter and a Aristotle 18 microguidewire into the cavernous segment of the right ICA. The microcatheter was then navigated over the wire into the right M3/MCA. Then, the microcatheter was connected to a penumbra aspiration pump. The guiding catheter balloon was inflated. The microcatheter was removed under constant aspiration.  Right internal carotid artery angiograms showed recanalization of the distal right M3 segment with occlusion of and M4 segment. Contrast extravasation was seen at the distal M3 level.  Under biplane roadmap, a zoom 71 aspiration catheter was navigated over a phenom 21 microcatheter and a Aristotle 18 microguidewire into the cavernous segment of the right ICA. The microcatheter was then navigated over the wire into the right M3/MCA. Then, a 2 x 6 mm microplex hypersoft coil was deployed in the distal right M3/MCA segment.  Right internal carotid artery angiograms showed slow flow in the M3 branch with no evidence of extravasation.  The guide catheter was  retracted into the right common carotid artery. Frontal and lateral angiograms of the neck were obtained. Atherosclerotic changes noted in the right carotid bifurcation without hemodynamically significant stenosis or evidence of new clot formation.  Flat panel CT of the head was obtained and post processed in a separate workstation with concurrent attending physician supervision. Selected images were sent to PACS. Small right sylvian contrast extravasation confirmed. Additionally, hyperdensity of the right basal ganglia and lateral right frontal cortex are noted, related to ongoing ischemia.  Right internal carotid artery angiograms with frontal and lateral views of the head showed occlusion of the embolized branch with no evidence of contrast extravasation. Patency of the remaining MCA branches noted.  The catheter was subsequently withdrawn.  Right common femoral artery angiograms were obtained in right anterior oblique and lateral views. The puncture is at the level of the mid common femoral artery which has adequate caliber for closure device utilization. The femoral sheath was exchanged for a Perclose pro style which was utilized for access closure. Immediate hemostasis was achieved.  IMPRESSION: Mechanical thrombectomy performed for treatment of right M1/MCA occlusion with clot fragmentation and embolization to same distal territory requiring multiple additional passes. Procedure complicated by contrast extravasation at a distal right M3/MCA branch requiring vessel embolization. Near complete recanalization of the right MCA vascular tree was achieved (TICI 2b).  PLAN: - Bed rest post femoral access x6h - Follow-up CT in 4 hours to evaluate for SAH/contrast extravasation stability - Transfer  to ICU- SBP: 120-140 mmHg   Electronically Signed   By: Pedro Earls M.D.   On: 05/15/2021 16:21   IR CT Head Ltd  Result Date: 05/15/2021 INDICATION: 45 year old female with past medical  history significant for uncontrolled hypertension, tobacco use disorder, uterine fibroids, heart failure, paroxysmal SVT, paranoid psychosis presenting with altered mental status. Head CT showed hypodensity within the right basal ganglia concerning for possible right MCA occlusion. A CT angiogram of the head and neck was then obtained and showed an occlusion of the right M1/MCA segment. CT perfusion showed a core infarct of 51 mL with a penumbra of 52 mL. Case was discussed with patient's brother and decision was made to proceed with mechanical thrombectomy. EXAM: DIAGNOSTIC CEREBRAL ANGIOGRAM MECHANICAL THROMBECTOMY FLAT PANEL HEAD CT COMPARISON:  CT/CT angiogram of the head and neck May 15, 2021. MEDICATIONS: 5 mg of verapamil intra arterial. ANESTHESIA/SEDATION: Procedure was performed under general anesthesia. CONTRAST:  75 mL of Omnipaque 240 milligram/mL FLUOROSCOPY TIME:  Fluoroscopy Time: 21 minutes 24 seconds (1090 mGy). COMPLICATIONS: SIR LEVEL C - Requires therapy, minor hospitalization (<48 hrs). TECHNIQUE: Informed written consent was obtained from the patient's brother after a thorough discussion of the procedural risks, benefits and alternatives. All questions were addressed. Maximal Sterile Barrier Technique was utilized including caps, mask, sterile gowns, sterile gloves, sterile drape, hand hygiene and skin antiseptic. A timeout was performed prior to the initiation of the procedure. The right groin was prepped and draped in the usual sterile fashion. Using a micropuncture kit and the modified Seldinger technique, access was gained to the right common femoral artery and an 8 French sheath was placed. Under fluoroscopy, an 8 [place] Walrus balloon guide catheter was navigated over a 6 [place] Berenstein 2 catheter and a 0.035" Terumo Glidewire into the aortic arch. The catheter was placed into the right common carotid artery and then advanced into the right internal carotid artery. The inner catheter  was removed. Frontal and lateral angiograms of the head were obtained. FINDINGS: 1. There is a mid right M1/MCA occlusion. 2. Atherosclerotic changes of the right carotid bifurcation without hemodynamically significant stenosis. PROCEDURE: Under biplane roadmap, a zoom 71 aspiration catheter was navigated over a Aristotle 18 microguidewire into the cavernous segment of the right ICA. The aspiration catheter was advanced to the level of occlusion at the right M1 segment and connected to a penumbra aspiration pump. The guiding catheter balloon was inflated. The aspiration catheter was removed under constant aspiration. Right internal carotid artery angiograms showed recanalization of the right M1 segment with occlusion of a distal M2/MCA posterior division branch. Under biplane roadmap, a zoom 71 aspiration catheter was navigated over a Aristotle 18 microguidewire into the right M1/MCA. The aspiration catheter was advanced over the wire to the level of occlusion at the right M2 segment and connected to a penumbra aspiration pump. The guiding catheter balloon was inflated. The aspiration catheter was removed under constant aspiration. Right internal carotid artery angiograms showed recanalization of the right M2 segment with occlusion of M3/MCA posterior division branches. Under biplane roadmap, a zoom 71 aspiration catheter was navigated over a phenom 21 microcatheter and a Aristotle 18 microguidewire into the cavernous segment of the right ICA. The microcatheter was then navigated over the wire into the right M3/MCA. Then, a 3 mm solitaire stent retriever was deployed spanning the M2-M3 segment. The device was allowed to intercalated with the clot for 4 minutes. The microcatheter was removed. The aspiration catheter was advanced to  the level of occlusion and connected to a penumbra aspiration pump. The guiding catheter balloon was inflated. The thrombectomy device and aspiration catheter were removed under constant  aspiration. Right internal carotid artery angiograms showed recanalization of the proximal right M3 segment with occlusion of distal M3/MCA posterior division branches. Under biplane roadmap, a zoom 71 aspiration catheter was navigated over a phenom 21 microcatheter and a Aristotle 18 microguidewire into the cavernous segment of the right ICA. The microcatheter was then navigated over the wire into the right M3/MCA. Then, the microcatheter was connected to a penumbra aspiration pump. The guiding catheter balloon was inflated. The microcatheter was removed under constant aspiration. Right internal carotid artery angiograms showed persistent occlusion of a distal M3/MCA posterior division branch. Under biplane roadmap, a zoom 71 aspiration catheter was navigated over a phenom 21 microcatheter and a Aristotle 18 microguidewire into the cavernous segment of the right ICA. The microcatheter was then navigated over the wire into the right M3/MCA. Then, the microcatheter was connected to a penumbra aspiration pump. The guiding catheter balloon was inflated. The microcatheter was removed under constant aspiration. Right internal carotid artery angiograms showed recanalization of the distal right M3 segment with occlusion of and M4 segment. Contrast extravasation was seen at the distal M3 level. Under biplane roadmap, a zoom 71 aspiration catheter was navigated over a phenom 21 microcatheter and a Aristotle 18 microguidewire into the cavernous segment of the right ICA. The microcatheter was then navigated over the wire into the right M3/MCA. Then, a 2 x 6 mm microplex hypersoft coil was deployed in the distal right M3/MCA segment. Right internal carotid artery angiograms showed slow flow in the M3 branch with no evidence of extravasation. The guide catheter was retracted into the right common carotid artery. Frontal and lateral angiograms of the neck were obtained. Atherosclerotic changes noted in the right carotid  bifurcation without hemodynamically significant stenosis or evidence of new clot formation. Flat panel CT of the head was obtained and post processed in a separate workstation with concurrent attending physician supervision. Selected images were sent to PACS. Small right sylvian contrast extravasation confirmed. Additionally, hyperdensity of the right basal ganglia and lateral right frontal cortex are noted, related to ongoing ischemia. Right internal carotid artery angiograms with frontal and lateral views of the head showed occlusion of the embolized branch with no evidence of contrast extravasation. Patency of the remaining MCA branches noted. The catheter was subsequently withdrawn. Right common femoral artery angiograms were obtained in right anterior oblique and lateral views. The puncture is at the level of the mid common femoral artery which has adequate caliber for closure device utilization. The femoral sheath was exchanged for a Perclose pro style which was utilized for access closure. Immediate hemostasis was achieved. IMPRESSION: Mechanical thrombectomy performed for treatment of right M1/MCA occlusion with clot fragmentation and embolization to same distal territory requiring multiple additional passes. Procedure complicated by contrast extravasation at a distal right M3/MCA branch requiring vessel embolization. Near complete recanalization of the right MCA vascular tree was achieved (TICI 2b). PLAN: - Bed rest post femoral access x6h - Follow-up CT in 4 hours to evaluate for SAH/contrast extravasation stability - Transfer to ICU- SBP: 120-140 mmHg Electronically Signed   By: Pedro Earls M.D.   On: 05/15/2021 16:21   CT CEREBRAL PERFUSION W CONTRAST  Result Date: 05/15/2021 CLINICAL DATA:  Code stroke. EXAM: CT ANGIOGRAPHY HEAD AND NECK CT PERFUSION BRAIN TECHNIQUE: Multidetector CT imaging of the head and neck  was performed using the standard protocol during bolus administration  of intravenous contrast. Multiplanar CT image reconstructions and MIPs were obtained to evaluate the vascular anatomy. Carotid stenosis measurements (when applicable) are obtained utilizing NASCET criteria, using the distal internal carotid diameter as the denominator. Multiphase CT imaging of the brain was performed following IV bolus contrast injection. Subsequent parametric perfusion maps were calculated using RAPID software. CONTRAST:  61m OMNIPAQUE IOHEXOL 350 MG/ML SOLN COMPARISON:  Head CT from earlier today FINDINGS: CTA NECK FINDINGS Aortic arch: Motion degraded.  No acute finding Right carotid system: Mild mixed density plaque at the bifurcation. No stenosis or ulceration. Left carotid system: Mixed density plaque at the bifurcation. No stenosis or ulceration. Vertebral arteries: No proximal subclavian stenosis. Low-density bulge into the left subclavian lumen at the expected location of the left vertebral origin, with left vertebral occlusion. There is V4 segment reconstitution. No flow limiting stenosis or dissection seen on the right. Skeleton: Retropharyngeal fluid. There is contusion like appearance of the left cheek and the left lateral neck. Muscles on the left lateral neck may be diffusely edematous and mildly swollen. Other neck: Subcentimeter nodules in the thyroid. No followup recommended (ref: J Am Coll Radiol. 2015 Feb;12(2): 143-50). Upper chest: Left supraclavicular stranding which is contiguous with the left neck edema. No evidence of air leak or contusion. Review of the MIP images confirms the above findings CTA HEAD FINDINGS Anterior circulation: Right M1 occlusion with abrupt cut off. Heavily calcified carotid siphons which limits detection of luminal stenosis. The right ICA is more heavily affected with high-grade narrowing possible at the petrous/cavernous junction and anterior genu. Hypoplastic right A1 segment. Atheromatous irregularity of MCA and ACA branches. Posterior  circulation: Reconstituted left V4. Atheromatous plaque at the bilateral V4 segments. The basilar is smooth and widely patent. No PCA branch occlusion or aneurysm. Venous sinuses: Right dominant transverse sigmoid system. No acute thrombosis. Anatomic variants: As above Review of the MIP images confirms the above findings CT Brain Perfusion Findings: ASPECTS: 7 - 8 ( caudate and putamen infarct. There could be anterior insular infarct on the right, but similar to the left and indeterminate due to the degree of chronic changes). CBF (<30%) Volume: 555mPerfusion (Tmax>6.0s) volume: 10378mismatch Volume: 23m38mfarction Location:Lateral right frontal lobe and right basal ganglia Case discussed with Dr. KirkLeonel Ramsayle in progress. IMPRESSION: 1. Emergent large vessel occlusion at the right M1 origin. CT perfusion suggestive 50 cc core infarct and 50 cc of adjacent penumbra. 2. Left vertebral occlusion at its origin (where there is subclavian luminal density from thrombus or plaque) with reconstitution by the V4 segment. 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 3. Retropharyngeal fluid and subcutaneous/intramuscular edema in the left neck. Given the history this may be from traumatic soft tissue injury or pressure necrosis. 4. Premature atherosclerosis and chronic small vessel disease. High-grade narrowings of the right cavernous ICA which may be accentuated by underfilling from downstream occlusion and right A1 hypoplasia. Electronically Signed   By: JonaMonte Fantasia.   On: 05/15/2021 07:49   DG Abd Portable 1V  Result Date: 05/16/2021 CLINICAL DATA:  Check feeding catheter placement EXAM: PORTABLE ABDOMEN - 1 VIEW COMPARISON:  None. FINDINGS: Scattered large and small bowel gas is noted. Feeding catheter is noted in the distal stomach. No free air is noted. IMPRESSION: Feeding catheter in the distal stomach. Electronically Signed   By: MarkInez CatalinaD.   On: 05/16/2021 11:31  ECHOCARDIOGRAM COMPLETE  Result Date: 05/16/2021    ECHOCARDIOGRAM REPORT   [name] Date of Exam: 05/16/2021 Medical Rec #:  [id number]        Height:       65.0 in Accession #:    [id number]       Weight:       160.0 lb Date of Birth:  03-Aug-1976        BSA:          1.799 m Patient Age:    78 years         BP:           141/75 mmHg Patient Gender: F                HR:           92 bpm. Exam Location:  Inpatient Procedure: 2D Echo, Cardiac Doppler and Color Doppler Indications:    CVA  History:        Patient has no prior history of Echocardiogram examinations.                 Risk Factors:Hypertension.  Sonographer:    Luisa Hart RDCS Referring Phys: Merlín.Osler MCNEILL P Baldwin  1. Left ventricular ejection fraction, by estimation, is 55 to 60%. The left ventricle has normal function. The left ventricle has no regional wall motion abnormalities. There is moderate concentric left ventricular hypertrophy. Left ventricular diastolic parameters are consistent with Grade II diastolic dysfunction (pseudonormalization). Elevated left atrial pressure.  2. Right ventricular systolic function is normal. The right ventricular size is normal. Mildly increased right ventricular wall thickness. There is moderately elevated pulmonary artery systolic pressure. The estimated right ventricular systolic pressure  is 03.4 mmHg.  3. Left atrial size was severely dilated.  4. Right atrial size was severely dilated.  5. There is no evidence of cardiac tamponade.  6. The mitral valve is normal in structure. Trivial mitral valve regurgitation. No evidence of mitral stenosis.  7. The aortic valve is normal in structure. There is mild calcification of the aortic valve. There is mild thickening of the aortic valve. Aortic valve regurgitation is not visualized. Mild aortic valve sclerosis is present, with no evidence of aortic valve stenosis.  8. The inferior vena cava is  dilated in size with <50% respiratory variability, suggesting right atrial pressure of 15 mmHg. Comparison(s): No prior Echocardiogram. Findings are suspicious for infiltrative cardiomyopathy such as amyloidosis. FINDINGS  Left Ventricle: Left ventricular ejection fraction, by estimation, is 55 to 60%. The left ventricle has normal function. The left ventricle has no regional wall motion abnormalities. The left ventricular internal cavity size was normal in size. There is  moderate concentric left ventricular hypertrophy. Left ventricular diastolic parameters are consistent with Grade II diastolic dysfunction (pseudonormalization). Elevated left atrial pressure. Right Ventricle: The right ventricular size is normal. Mildly increased right ventricular wall thickness. Right ventricular systolic function is normal. There is moderately elevated pulmonary artery systolic pressure. The tricuspid regurgitant velocity is 3.11 m/s, and with an assumed right atrial pressure of 15 mmHg, the estimated right ventricular systolic pressure is 74.2 mmHg. Left Atrium: Left atrial size was severely dilated. Right Atrium: Right atrial size was severely dilated. Pericardium: There is no evidence of pericardial effusion. There is no evidence of cardiac tamponade. Mitral Valve: The mitral valve is normal in structure. Trivial mitral valve regurgitation. No evidence of mitral valve stenosis. Tricuspid Valve: The tricuspid valve is normal in  structure. Tricuspid valve regurgitation is trivial. No evidence of tricuspid stenosis. Aortic Valve: The aortic valve is normal in structure. There is mild calcification of the aortic valve. There is mild thickening of the aortic valve. Aortic valve regurgitation is not visualized. Mild aortic valve sclerosis is present, with no evidence of aortic valve stenosis. Aortic valve mean gradient measures 12.0 mmHg. Aortic valve peak gradient measures 21.7 mmHg. Aortic valve area, by VTI measures 2.19 cm.  Pulmonic Valve: The pulmonic valve was normal in structure. Pulmonic valve regurgitation is trivial. No evidence of pulmonic stenosis. Aorta: The aortic root is normal in size and structure. Venous: The inferior vena cava is dilated in size with less than 50% respiratory variability, suggesting right atrial pressure of 15 mmHg. IAS/Shunts: No atrial level shunt detected by color flow Doppler.  LEFT VENTRICLE PLAX 2D LVIDd:         4.40 cm      Diastology LVIDs:         3.20 cm      LV e' medial:    5.87 cm/s LV PW:         1.80 cm      LV E/e' medial:  27.4 LV IVS:        1.65 cm      LV e' lateral:   7.36 cm/s LVOT diam:     1.90 cm      LV E/e' lateral: 21.9 LV SV:         88 LV SV Index:   49 LVOT Area:     2.84 cm  LV Volumes (MOD) LV vol d, MOD A2C: 150.0 ml LV vol d, MOD A4C: 108.0 ml LV vol s, MOD A2C: 81.0 ml LV vol s, MOD A4C: 52.4 ml LV SV MOD A2C:     69.0 ml LV SV MOD A4C:     108.0 ml LV SV MOD BP:      65.7 ml RIGHT VENTRICLE RV Basal diam:  4.60 cm     PULMONARY VEINS RV Mid diam:    2.30 cm     A Reversal Duration: 82.00 msec RV S prime:     13.60 cm/s  A Reversal Velocity: 39.70 cm/s TAPSE (M-mode): 1.4 cm      Diastolic Velocity:  89.84 cm/s                             S/D Velocity:        0.60                             Systolic Velocity:   21.03 cm/s LEFT ATRIUM             Index       RIGHT ATRIUM           Index LA diam:        3.20 cm 1.78 cm/m  RA Area:     24.80 cm LA Vol (A2C):   99.5 ml 55.30 ml/m RA Volume:   87.70 ml  48.74 ml/m LA Vol (A4C):   89.0 ml 49.47 ml/m LA Biplane Vol: 94.1 ml 52.30 ml/m  AORTIC VALVE                    PULMONIC VALVE AV Area (Vmax):    2.30 cm     PV Vmax:  1.59 m/s AV Area (Vmean):   2.19 cm     PV Vmean:      91.800 cm/s AV Area (VTI):     2.19 cm     PV VTI:        0.287 m AV Vmax:           233.00 cm/s  PV Peak grad:  10.1 mmHg AV Vmean:          163.000 cm/s PV Mean grad:  4.0 mmHg AV VTI:            0.400 m AV Peak Grad:      21.7 mmHg AV  Mean Grad:      12.0 mmHg LVOT Vmax:         189.00 cm/s LVOT Vmean:        126.000 cm/s LVOT VTI:          0.309 m LVOT/AV VTI ratio: 0.77  AORTA Ao Asc diam: 2.90 cm MITRAL VALVE                TRICUSPID VALVE MV Area (PHT): 3.42 cm     TR Peak grad:   38.7 mmHg MV Decel Time: 222 msec     TR Vmax:        311.00 cm/s MV E velocity: 161.00 cm/s MV A velocity: 81.90 cm/s   SHUNTS MV E/A ratio:  1.97         Systemic VTI:  0.31 m                             Systemic Diam: 1.90 cm Dani Gobble Croitoru MD Electronically signed by Sanda Klein MD Signature Date/Time: 05/16/2021/1:45:55 PM    Final    IR PERCUTANEOUS ART THROMBECTOMY/INFUSION INTRACRANIAL INC DIAG ANGIO  Result Date: 05/15/2021 INDICATION: 45 year old female with past medical history significant for uncontrolled hypertension, tobacco use disorder, uterine fibroids, heart failure, paroxysmal SVT, paranoid psychosis presenting with altered mental status. Head CT showed hypodensity within the right basal ganglia concerning for possible right MCA occlusion. A CT angiogram of the head and neck was then obtained and showed an occlusion of the right M1/MCA segment. CT perfusion showed a core infarct of 51 mL with a penumbra of 52 mL. Case was discussed with patient's brother and decision was made to proceed with mechanical thrombectomy. EXAM: DIAGNOSTIC CEREBRAL ANGIOGRAM MECHANICAL THROMBECTOMY FLAT PANEL HEAD CT COMPARISON:  CT/CT angiogram of the head and neck May 15, 2021. MEDICATIONS: 5 mg of verapamil intra arterial. ANESTHESIA/SEDATION: Procedure was performed under general anesthesia. CONTRAST:  75 mL of Omnipaque 240 milligram/mL FLUOROSCOPY TIME:  Fluoroscopy Time: 21 minutes 24 seconds (1090 mGy). COMPLICATIONS: SIR LEVEL C - Requires therapy, minor hospitalization (<48 hrs). TECHNIQUE: Informed written consent was obtained from the patient's brother after a thorough discussion of the procedural risks, benefits and alternatives. All questions were  addressed. Maximal Sterile Barrier Technique was utilized including caps, mask, sterile gowns, sterile gloves, sterile drape, hand hygiene and skin antiseptic. A timeout was performed prior to the initiation of the procedure. The right groin was prepped and draped in the usual sterile fashion. Using a micropuncture kit and the modified Seldinger technique, access was gained to the right common femoral artery and an 8 French sheath was placed. Under fluoroscopy, an 8 [place] Walrus balloon guide catheter was navigated over a 6 [place] Berenstein 2 catheter and a 0.035" Terumo Glidewire into the aortic arch. The catheter  was placed into the right common carotid artery and then advanced into the right internal carotid artery. The inner catheter was removed. Frontal and lateral angiograms of the head were obtained. FINDINGS: 1. There is a mid right M1/MCA occlusion. 2. Atherosclerotic changes of the right carotid bifurcation without hemodynamically significant stenosis. PROCEDURE: Under biplane roadmap, a zoom 71 aspiration catheter was navigated over a Aristotle 18 microguidewire into the cavernous segment of the right ICA. The aspiration catheter was advanced to the level of occlusion at the right M1 segment and connected to a penumbra aspiration pump. The guiding catheter balloon was inflated. The aspiration catheter was removed under constant aspiration. Right internal carotid artery angiograms showed recanalization of the right M1 segment with occlusion of a distal M2/MCA posterior division branch. Under biplane roadmap, a zoom 71 aspiration catheter was navigated over a Aristotle 18 microguidewire into the right M1/MCA. The aspiration catheter was advanced over the wire to the level of occlusion at the right M2 segment and connected to a penumbra aspiration pump. The guiding catheter balloon was inflated. The aspiration catheter was removed under constant aspiration. Right internal carotid artery angiograms showed  recanalization of the right M2 segment with occlusion of M3/MCA posterior division branches. Under biplane roadmap, a zoom 71 aspiration catheter was navigated over a phenom 21 microcatheter and a Aristotle 18 microguidewire into the cavernous segment of the right ICA. The microcatheter was then navigated over the wire into the right M3/MCA. Then, a 3 mm solitaire stent retriever was deployed spanning the M2-M3 segment. The device was allowed to intercalated with the clot for 4 minutes. The microcatheter was removed. The aspiration catheter was advanced to the level of occlusion and connected to a penumbra aspiration pump. The guiding catheter balloon was inflated. The thrombectomy device and aspiration catheter were removed under constant aspiration. Right internal carotid artery angiograms showed recanalization of the proximal right M3 segment with occlusion of distal M3/MCA posterior division branches. Under biplane roadmap, a zoom 71 aspiration catheter was navigated over a phenom 21 microcatheter and a Aristotle 18 microguidewire into the cavernous segment of the right ICA. The microcatheter was then navigated over the wire into the right M3/MCA. Then, the microcatheter was connected to a penumbra aspiration pump. The guiding catheter balloon was inflated. The microcatheter was removed under constant aspiration. Right internal carotid artery angiograms showed persistent occlusion of a distal M3/MCA posterior division branch. Under biplane roadmap, a zoom 71 aspiration catheter was navigated over a phenom 21 microcatheter and a Aristotle 18 microguidewire into the cavernous segment of the right ICA. The microcatheter was then navigated over the wire into the right M3/MCA. Then, the microcatheter was connected to a penumbra aspiration pump. The guiding catheter balloon was inflated. The microcatheter was removed under constant aspiration. Right internal carotid artery angiograms showed recanalization of the  distal right M3 segment with occlusion of and M4 segment. Contrast extravasation was seen at the distal M3 level. Under biplane roadmap, a zoom 71 aspiration catheter was navigated over a phenom 21 microcatheter and a Aristotle 18 microguidewire into the cavernous segment of the right ICA. The microcatheter was then navigated over the wire into the right M3/MCA. Then, a 2 x 6 mm microplex hypersoft coil was deployed in the distal right M3/MCA segment. Right internal carotid artery angiograms showed slow flow in the M3 branch with no evidence of extravasation. The guide catheter was retracted into the right common carotid artery. Frontal and lateral angiograms of the neck were obtained. Atherosclerotic  changes noted in the right carotid bifurcation without hemodynamically significant stenosis or evidence of new clot formation. Flat panel CT of the head was obtained and post processed in a separate workstation with concurrent attending physician supervision. Selected images were sent to PACS. Small right sylvian contrast extravasation confirmed. Additionally, hyperdensity of the right basal ganglia and lateral right frontal cortex are noted, related to ongoing ischemia. Right internal carotid artery angiograms with frontal and lateral views of the head showed occlusion of the embolized branch with no evidence of contrast extravasation. Patency of the remaining MCA branches noted. The catheter was subsequently withdrawn. Right common femoral artery angiograms were obtained in right anterior oblique and lateral views. The puncture is at the level of the mid common femoral artery which has adequate caliber for closure device utilization. The femoral sheath was exchanged for a Perclose pro style which was utilized for access closure. Immediate hemostasis was achieved. IMPRESSION: Mechanical thrombectomy performed for treatment of right M1/MCA occlusion with clot fragmentation and embolization to same distal territory  requiring multiple additional passes. Procedure complicated by contrast extravasation at a distal right M3/MCA branch requiring vessel embolization. Near complete recanalization of the right MCA vascular tree was achieved (TICI 2b). PLAN: - Bed rest post femoral access x6h - Follow-up CT in 4 hours to evaluate for SAH/contrast extravasation stability - Transfer to ICU- SBP: 120-140 mmHg Electronically Signed   By: Pedro Earls M.D.   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 Does the need for close, 24 hr/day medical supervision in concert with the patient's rehab needs make it unreasonable for this patient to be served in a less intensive setting? Yes 2. Co-Morbidities requiring supervision/potential complications: HTN, CHF, hx of SVT 3. Due to bladder management, bowel management, safety, skin/wound care, disease management, medication administration, pain management and patient education, does the patient require 24 hr/day rehab nursing? Yes 4. Does the patient require coordinated care of a physician, rehab nurse, therapy disciplines of PT, OT, SLP to address physical and functional deficits in the context of the above medical diagnosis(es)? Yes Addressing deficits in the following areas: balance, endurance, locomotion, strength, transferring, bowel/bladder control, bathing, dressing, feeding, grooming, toileting, cognition, speech, swallowing and psychosocial support 5. Can the patient actively participate in an intensive therapy program of at least 3 hrs of therapy per day at least 5 days per week? Yes and Potentially 6. The potential for patient to make measurable gains while on inpatient rehab is good 7. Anticipated functional outcomes upon discharge from inpatient rehab are supervision and min assist  with PT, supervision and min assist with OT, supervision  with SLP. 8. Estimated rehab length of stay to reach the above functional goals is: 22-28 days 9. Anticipated discharge destination: Home 10. Overall Rehab/Functional Prognosis: good  RECOMMENDATIONS: This patient's condition is appropriate for continued rehabilitative care in the following setting: CIR Patient has agreed to participate in recommended program. N/A Note that insurance prior authorization may be required for reimbursement for recommended care.  Comment: Rehab Admissions Coordinator to follow up.  Thanks,  Meredith Staggers, MD, Mellody Drown  I have personally performed a face to face diagnostic evaluation of this patient. Additionally, I have examined pertinent labs and radiographic images. I have reviewed and concur with the physician assistant's documentation above.    Lavon Paganini Angiulli, PA-C 05/17/2021

## 2021-05-17 NOTE — Progress Notes (Signed)
RT assisted tube exchange with no complications. PT now receiving adequate tidal volume and minute ventilation

## 2021-05-17 NOTE — Progress Notes (Signed)
  Speech Language Pathology Treatment: Dysphagia;Cognitive-Linquistic  Patient Details Name: Holly Bradley MRN: [id number] DOB: 1976-01-17 Today's Date: 05/17/2021 Time: 0940-7680 SLP Time Calculation (min) (ACUTE ONLY): 34 min  Assessment / Plan / Recommendation Clinical Impression  SLP followed up for dysphagia and cognitive linguistic intervention. Pt more responsive this date overall as compared to ST notes yesterday. Pt upright in chair, provided diligent oral care; xerostomia noted, improving post oral care. Pt able to follow simple commands (open mouth, protrude tongue, open eyes) with approximately 80 percent accuracy this date. Pt eagerly agreeing for PO trials. Pt consumed ice chips by teaspoon, 1/4 tsp water, 1/2 tsp nectar thick, tsp of honey thick liquids, and tsp of puree. Pt noted to have baseline increased RR 30-35 throughout session. Coordination for breathing and swallowing appeared mildly taxed. Pt exhibiting spontaneous momentary breath hold during the swallow (suspected to aid swallow function per clinical interaction). Pt with frequent throat clearing throughout PO trials and suspected delay in swallow initiation per palpation. Vocal quality however remained clear. No overt coughing exhibited. Recommend proceed with instrumental swallow assessment to guide diet recommendations and POC. Will follow up next date for completion of objective exam. Pt okay for PO ice chips and PO medicines crushed in puree with diligent oral care as tolerated.      HPI HPI: Pt is a [name] who presents with AMS and slurred speech after being found down. MRI showing moderately large acute right MCA infarct with associated petechial hemorrhage, SAH, and minimal IVH. S/p mechanical thrombectomy of M1/M2/M3 and embolization of right MCA M3 branch. Significant PMH: uncontrolled HTN, tobacco use disorder, HF, paroxysmal SVT, paranoid psychosis      SLP Plan  Other (Comment);Continue with current plan of  care (MBSS or FEES)       Recommendations  Diet recommendations: NPO Medication Administration: Crushed with puree                Oral Care Recommendations: Oral care QID;Oral care prior to ice chip/H20 Follow up Recommendations: Inpatient Rehab;24 hour supervision/assistance SLP Visit Diagnosis: Dysphagia, unspecified (R13.10);Dysphagia, oral phase (R13.11);Dysarthria and anarthria (R47.1);Cognitive communication deficit (R41.841) Plan: Other (Comment);Continue with current plan of care (MBSS or FEES)       GO                Hayden Rasmussen MA, CCC-SLP Acute Rehabilitation Services   05/17/2021, 1:13 PM

## 2021-05-17 NOTE — Progress Notes (Signed)
eLink Physician-Brief Progress Note [name] DOB: [birth year] MRN: [id number]   Date of Service  05/17/2021  HPI/Events of Note  RT reporting that patient's ET tube has a leak and the cuff pilot balloon is not holding air, suggesting a damaged  ET tube cuff requiring an exchange of ET tubes. ET tube is at the same position as previously at the lip.  eICU Interventions  PCCM Ground Crew requested to evaluate patient for possible need to exchange ET tube.        [name] 05/17/2021, 8:24 PM

## 2021-05-17 NOTE — Progress Notes (Signed)
Inpatient Rehabilitation Admissions Coordinator  I met at patient bedside for assessment and then contacted her brother, [name], by  [employer]. I discussed with [name] goals and expectations of a possible Cir admit. Prior to admit patient had lost her job less than 2 weeks ago. 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 At home her brother, [name] and his husband can provide 24/7 physical assistance after any rehab venue. He would prefer Cir admit. I will contact [employer] to assess patient for disability and Medicaid as she is uninsured. I will follow her progress to assist with planning a possible CIR admit.  Danne Baxter, RN, MSN Rehab Admissions Coordinator [phone] 05/17/2021 11:55 AM

## 2021-05-17 NOTE — Consult Note (Addendum)
NAME:  Holly Bradley, MRN:  [id number], DOB:  Oct 27, 1976, LOS: 2 ADMISSION DATE:  05/15/2021, CONSULTATION DATE:  05/17/21 REFERRING MD:  Leonie Man, CHIEF COMPLAINT:  Respiratory failure   History of Present Illness:  [name] with PMH of HTN, tobacco use, uterine fibroids, paranoid psychosis who presented with AMS on 5/24.   She was diagnosed with R MCA M1 Occlusion and underwent successful mechanical thrombectomy and with contrast extravasation and M3 coil embolization.   revascularization but had subsequent SAH at the R sylvan fissure.  On follow up imaging, she had stable appearing SAH and continued R basal ganglia infarct with 24mm R to L shift.   She was admitted to the Neuro ICU and treating with hypertonic saline, statin and BP control.  On 5/26, PCCM consulted when pt became acutely less responsive with respiratory distress along with abdominal distension.   She was intubated and repeat head CT ordered.  Pertinent  Medical History   has a past medical history of Hypertension., Psychosis, HTN, tobacco use   Significant Hospital Events: Including procedures, antibiotic start and stop dates in addition to other pertinent events   . 5/24 Admit to Neurology . 5/26 decreased mental status and respiratory distress, intubated   Interim History / Subjective:  Intubated without difficulty   Objective   Blood pressure (!) 164/66, pulse (!) 102, temperature (!) 100.6 F (38.1 C), temperature source Oral, resp. rate (!) 32, height 5\' 5"  (1.651 m), weight 72.6 kg, SpO2 97 %.        Intake/Output Summary (Last 24 hours) at 05/17/2021 1639 Last data filed at 05/17/2021 1500 Gross per 24 hour  Intake 4361.02 ml  Output 650 ml  Net 3711.02 ml   Filed Weights   05/15/21 0515  Weight: 72.6 kg    General:  Somnolent F, intubated and sedated HEENT: MM pink/moist, ETT in place, sclera anicteric Neuro: minimally responsive to pain prior to intubation CV: s1s2 rrr, no m/r/g PULM:  Clear  bilaterally, on full vent support  GI: soft, distended with large mass Extremities: warm/dry, no edema  Skin: no rashes or lesions  Labs/imaging that I havepersonally reviewed  (right click and "Reselect all SmartList Selections" daily)  CBC BMP  Resolved Hospital Problem list     Assessment & Plan:    Acute Respiratory Failure and worsening AMS in the setting of acute large R MCA infarct with R sylvan fissure SAH and midline shift Intubated and placed on ventilatory support P: -stat head CT pending -f/u ABG -stroke management per neurology, continue hypertonic saline, goal Na 150-155 --Maintain full vent support with SAT/SBT as tolerated -titrate Vent setting to maintain SpO2 greater than or equal to 90%. -HOB elevated 30 degrees. -Plateau pressures less than 30 cm H20.  -Follow chest x-ray, ABG prn.   -Bronchial hygiene and RT/bronchodilator protocol.    Abdominal distension  Reported Hx of uterine fibroids  Do not see previous abdominal imaging other than abdominal xray yesterday without free air P: -CBC pending -CT abd/pelvis pending   Best practice (right click and "Reselect all SmartList Selections" daily)  Diet:  NPO Pain/Anxiety/Delirium protocol (if indicated): Yes (RASS goal -1) VAP protocol (if indicated): Yes DVT prophylaxis: SCD GI prophylaxis: PPI Glucose control:  SSI Yes Central venous access:  N/A Arterial line:  N/A Foley:  N/A Mobility:  bed rest  PT consulted: Yes Last date of multidisciplinary goals of care discussion [pending] Code Status:  full code Disposition: ICU  Labs   CBC: Recent  Labs  Lab 05/15/21 0430 05/15/21 1221  WBC 10.2  --   NEUTROABS 8.7*  --   HGB 8.0* 7.8*  HCT 27.4* 26.5*  MCV 70.8*  --   PLT 356  --     Basic Metabolic Panel: Recent Labs  Lab 05/15/21 0430 05/16/21 1056 05/16/21 1600 05/17/21 0134 05/17/21 0415 05/17/21 1023 05/17/21 1600  NA 140   < > 143 145 150* 150* 152*  K 3.3*  --   --   --   3.5  --   --   CL 108  --   --   --  123*  --   --   CO2 21*  --   --   --  16*  --   --   GLUCOSE 114*  --   --   --  127*  --   --   BUN 17  --   --   --  14  --   --   CREATININE 1.27*  --   --   --  1.48*  --   --   CALCIUM 9.3  --   --   --  8.6*  --   --   MG  --   --   --   --  2.0  --   --   PHOS  --   --   --   --  3.3  --   --    < > = values in this interval not displayed.   GFR: Estimated Creatinine Clearance: 46.9 mL/min (A) (by C-G formula based on SCr of 1.48 mg/dL (H)). Recent Labs  Lab 05/15/21 0430  WBC 10.2    Liver Function Tests: Recent Labs  Lab 05/15/21 0430  AST 26  ALT 19  ALKPHOS 88  BILITOT 1.0  PROT 7.6  ALBUMIN 3.5   No results for input(s): LIPASE, AMYLASE in the last 168 hours. Recent Labs  Lab 05/15/21 0438  AMMONIA <9*    ABG No results found for: PHART, PCO2ART, PO2ART, HCO3, TCO2, ACIDBASEDEF, O2SAT   Coagulation Profile: No results for input(s): INR, PROTIME in the last 168 hours.  Cardiac Enzymes: No results for input(s): CKTOTAL, CKMB, CKMBINDEX, TROPONINI in the last 168 hours.  HbA1C: Hgb A1c MFr Bld  Date/Time Value Ref Range Status  05/16/2021 04:57 AM 5.4 4.8 - 5.6 % Final    Comment:    (NOTE)         Prediabetes: 5.7 - 6.4         Diabetes: >6.4         Glycemic control for adults with diabetes: <7.0     CBG: Recent Labs  Lab 05/16/21 2331 05/17/21 0349 05/17/21 0744 05/17/21 1126 05/17/21 1529  GLUCAP 157* 131* 139* 132* 136*    Review of Systems:   Unable to obtain secondary to mental status  Past Medical History:  She,  has a past medical history of Hypertension.   Surgical History:   Past Surgical History:  Procedure Laterality Date  . IR ANGIOGRAM FOLLOW UP STUDY  05/15/2021  . IR CT HEAD LTD  05/15/2021  . IR PERCUTANEOUS ART THROMBECTOMY/INFUSION INTRACRANIAL INC DIAG ANGIO  05/15/2021      . IR PERCUTANEOUS ART THROMBECTOMY/INFUSION INTRACRANIAL INC DIAG ANGIO  05/15/2021  . IR  TRANSCATH/EMBOLIZ  05/15/2021  . RADIOLOGY WITH ANESTHESIA N/A 05/15/2021   Procedure: IR WITH ANESTHESIA;  Surgeon: Radiologist, Medication, MD;  Location: [facility];  Service: Radiology;  Laterality: N/A;     Social History:   reports that she has been smoking. She has never used smokeless tobacco.   Family History:  Her family history is not on file.   Allergies No Known Allergies   Home Medications  Prior to Admission medications   Medication Sig Start Date End Date Taking? Authorizing Provider  amiodarone (PACERONE) 200 MG tablet Take 200 mg by mouth daily. 02/02/21   [provider]  ENTRESTO 97-103 MG Take 1 tablet by mouth 2 (two) times daily. 02/20/21   [provider]  FEROSUL 325 (65 Fe) MG tablet Take 325 mg by mouth 2 (two) times daily. 02/20/21   [provider]  hydrALAZINE (APRESOLINE) 100 MG tablet Take 100 mg by mouth 3 (three) times daily. 02/12/21   [provider]  isosorbide mononitrate (IMDUR) 60 MG 24 hr tablet Take 60 mg by mouth daily. 02/02/21   [provider]  KLOR-CON M20 20 MEQ tablet Take 40 mEq by mouth 2 (two) times daily. 02/12/21   [provider]  megestrol (MEGACE) 20 MG tablet Take 40 mg by mouth in the morning and at bedtime. May increase to 4 tablets ( 80 mg) twice daily in the event of heavy breathing 03/08/21   [provider]  metoprolol (TOPROL-XL) 200 MG 24 hr tablet Take 200 mg by mouth daily. 02/02/21   [provider]  torsemide (DEMADEX) 20 MG tablet Take 40 mg by mouth 2 (two) times daily. 02/12/21   [provider]     Critical care time: 35 minutes    CRITICAL CARE Performed by: Otilio Carpen Kymoni Lesperance   Total critical care time: 35 minutes  Critical care time was exclusive of separately billable procedures and treating other patients.  Critical care was necessary to treat or prevent imminent or life-threatening deterioration.  Critical care was time spent personally  by me on the following activities: development of treatment plan with patient and/or surrogate as well as nursing, discussions with consultants, evaluation of patient's response to treatment, examination of patient, obtaining history from patient or surrogate, ordering and performing treatments and interventions, ordering and review of laboratory studies, ordering and review of radiographic studies, pulse oximetry and re-evaluation of patient's condition.   Otilio Carpen Kylene Zamarron, PA-C [facility] See Amion for pager If no response to pager , please call [phone] until 7pm After 7:00 pm call Elink  [phone]?[place]

## 2021-05-17 NOTE — Progress Notes (Signed)
Updated Neuro MD Lorrin Goodell about patient's sodium of 150. New orders received to change rate of hypertonic saline to 50 mL/hour.

## 2021-05-17 NOTE — Progress Notes (Addendum)
Transferred pt from chair to bed via sara stedy. Pt. Noted with increased work of breathing, positive accessory muscle use, tachypnea with RR 28-30s. SpO2 good 98% on room air. 2L o2 applied for comfort. Pt. Remains responsive but requires more stimulation than earlier in the shift. Still following commands appropriately. Abdomen remains distended and firm. Palpable masses noted and observed by CCM (?fibroids). Positive BSx4. No BM since PTA. No flatus per pt report. Dr. Leonie Man made aware. CCM on floor and consulted for worsening resp status, and ABD distention.

## 2021-05-17 NOTE — Progress Notes (Signed)
STROKE TEAM PROGRESS NOTE   INTERVAL HISTORY  She presented with right M1 occlusion and underwent mechanical thrombectomy which was complicated by contrast extravasation requiring branch vessel embolization.  Follow-up CT scan showed moderate size right MCA infarct with stable subarachnoid hemorrhage.  Continue neurological monitoring and strict blood pressure control with systolic goal 400.   Patient neurological exam remains unchanged she does have eye-opening apraxia but can follow commands well on the right side though she continues to have significant right gaze preference, left-sided neglect and dense left hemiplegia Wean cleviprex, start home medication, and add amlodipine 10. On hypertonic saline through peripheral IV at 50 cc an hour with serum sodium goal between 150-155 to control cytotoxic edema.  Na this am 150. Increase 3% to 30ml/hr. Will obtain CT head in the morning to evaluate stroke, hemorrhage and cerebral edema.   Vitals:   05/17/21 1345 05/17/21 1400 05/17/21 1415 05/17/21 1519  BP: (!) 145/68 (!) 164/68 (!) 161/74 (!) 164/66  Pulse: 99 (!) 101 (!) 102   Resp: (!) 25 (!) 32 (!) 32   Temp:      TempSrc:      SpO2: 98% 98% 97%   Weight:      Height:       CBC:  Recent Labs  Lab 05/15/21 0430 05/15/21 1221  WBC 10.2  --   NEUTROABS 8.7*  --   HGB 8.0* 7.8*  HCT 27.4* 26.5*  MCV 70.8*  --   PLT 356  --    Basic Metabolic Panel:  Recent Labs  Lab 05/15/21 0430 05/16/21 1056 05/17/21 0415 05/17/21 1023  NA 140   < > 150* 150*  K 3.3*  --  3.5  --   CL 108  --  123*  --   CO2 21*  --  16*  --   GLUCOSE 114*  --  127*  --   BUN 17  --  14  --   CREATININE 1.27*  --  1.48*  --   CALCIUM 9.3  --  8.6*  --   MG  --   --  2.0  --   PHOS  --   --  3.3  --    < > = values in this interval not displayed.   Lipid Panel:  Recent Labs  Lab 05/16/21 0457  CHOL 131  TRIG 178*  HDL 19*  CHOLHDL 6.9  VLDL 36  LDLCALC 76   HgbA1c:  Recent Labs  Lab  05/16/21 0457  HGBA1C 5.4   Urine Drug Screen:  Recent Labs  Lab 05/15/21 0445  LABOPIA NONE DETECTED  COCAINSCRNUR NONE DETECTED  LABBENZ NONE DETECTED  AMPHETMU NONE DETECTED  THCU NONE DETECTED  LABBARB NONE DETECTED    Alcohol Level  Recent Labs  Lab 05/15/21 0438  ETH <10    IMAGING  CT HEAD WO CONTRAST Result Date: 05/16/2021  IMPRESSION:  1. Continued interval evolution of moderately large right MCA distribution infarct. Associated regional mass effect has mildly increased, with new 2 mm of right-to-left midline shift. No hydrocephalus or trapping.  2. Decreased prominence of subarachnoid hemorrhage at the posterior right Sylvian fissure and overlying right frontoparietal region. Trace intraventricular hemorrhage related to redistribution.  3. Question new area of parenchymal hypodensity involving the lateral left cerebellum. While this finding is favored to be artifactual, a possible new acute ischemic infarct is difficult to exclude, and could be considered in the correct clinical setting. Attention at follow-up recommended.  4.  No other new acute intracranial abnormality.   CT HEAD WO CONTRAST Result Date: 05/15/2021  IMPRESSION: Small-to-moderate volume hyperdensity within the right sylvian fissure and along portions of the right frontoparietal and temporal lobes compatible with extravasated contrast/subarachnoid hemorrhage. There is hyperdensity within the right basal ganglia, right insula, lateral right frontal lobe/frontal operculum and within portions of the right parietal lobe likely reflecting contrast staining at sites of acute infarction. Mild associated mass effect with subtle partial effacement of the right lateral ventricle. No midline shift or hydrocephalus. Embolization material now overlies the lateral right parietal lobe. Stable background parenchymal atrophy and chronic small vessel ischemic disease. Redemonstrated chronic infarct within the superior left  cerebellum.   MR BRAIN WO CONTRAST Result Date: 05/15/2021 IMPRESSION:  1. Moderately large acute right MCA infarct. Associated petechial hemorrhage, subarachnoid hemorrhage, and minimal intraventricular hemorrhage.  2. Severe chronic small vessel ischemic disease with multiple chronic infarcts.   MR CERVICAL SPINE WO CONTRAST Result Date: 05/15/2021  IMPRESSION: The cord itself appears normal. No disc disease. No spinal stenosis. 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 No significant encroachment upon the neural spaces. Some prevertebral soft tissue edema. In this clinical scenario, this may be subsequent to previous endotracheal intubation. One could not rule out pharyngitis or cellulitis but that seems less likely. I do not see any traumatic injury to the cervical spine itself. Electronically Signed   By: Nelson Chimes M.D.   On: 05/15/2021 16:05   IR CT Head Ltd Result Date: 05/15/2021 CT of the head was obtained and post processed in a separate workstation with concurrent attending physician supervision. Selected images were sent to PACS. Small right sylvian contrast extravasation confirmed. Additionally, hyperdensity of the right basal ganglia and lateral right frontal cortex are noted, related to ongoing ischemia. Right internal carotid artery angiograms with frontal and lateral views of the head showed occlusion of the embolized branch with no evidence of contrast extravasation. Patency of the remaining MCA branches noted.  IR PERCUTANEOUS ART THROMBECTOMY/INFUSION INTRACRANIAL INC DIAG ANGIO Result Date: 05/15/2021 IMPRESSION: Mechanical thrombectomy performed for treatment of right M1/MCA occlusion with clot fragmentation and embolization to same distal territory requiring multiple additional passes. Procedure complicated by contrast extravasation at a distal right M3/MCA branch requiring vessel embolization. Near complete recanalization of the right MCA vascular tree was  achieved (TICI 2b). PLAN: - Bed rest post femoral access x6h - Follow-up CT in 4 hours to evaluate for SAH/contrast extravasation stability - Transfer to ICU- SBP: 120-140 mmHg   Echo Result read: 05/16/2021 1. Left ventricular ejection fraction, by estimation, is 55 to 60%. The  left ventricle has normal function. The left ventricle has no regional  wall motion abnormalities. There is moderate concentric left ventricular  hypertrophy. Left ventricular  diastolic parameters are consistent with Grade II diastolic dysfunction  (pseudonormalization). Elevated left atrial pressure.  2. Right ventricular systolic function is normal. The right ventricular  size is normal. Mildly increased right ventricular wall thickness. There  is moderately elevated pulmonary artery systolic pressure. The estimated  right ventricular systolic pressure  is 28.4 mmHg.  3. Left atrial size was severely dilated.  4. Right atrial size was severely dilated.  5. There is no evidence of cardiac tamponade.  6. The mitral valve is normal in structure. Trivial mitral valve  regurgitation. No evidence of mitral stenosis.  7. The aortic valve is normal in structure. There is mild calcification  of the aortic valve. There is mild thickening of  the aortic valve. Aortic  valve regurgitation is not visualized. Mild aortic valve sclerosis is  present, with no evidence of aortic  valve stenosis.  8. The inferior vena cava is dilated in size with <50% respiratory  variability, suggesting right atrial pressure of 15 mmHg.   PHYSICAL EXAM General:  Pleasant middle-aged African-American lady laying comfortably in bed; in no acute distress.  She is wearing a cervical collar HENT: Normal oropharynx and mucosa. Normal external appearance of ears and nose. Neck: Supple, no pain or tenderness CV: No JVD. No peripheral edema. Pulmonary: Symmetric Chest rise. Normal respiratory effort. Abdomen: Soft to touch,  non-tender. Ext: No cyanosis, edema, or deformity Skin: No rash. Normal palpation of skin.  Musculoskeletal: Normal digits and nails by inspection. No clubbing.  Mental status/Cognition: eyes closed. Eye opening apraxia. Does not answer orientation questions.  Right gaze palsy.  left hemineglect. Speech/language: Non fluent, comprehension intact to simple commands. Unable to repeat or name objects.  Mild dysarthria Cranial Nerves: II: Unable to assess Visual Fields . Pupils are equal, round, and reactive to light.  III,IV, VI: Right gaze deviation unable to cross midline V: unable to assess facial sensation  VII:She has mild right lower facial weakness VIII: hearing appears to be intact to voice X: unable to assess uvula XI: does not shrug shoulder. XII: does not stick out tongue Motor: She has 3/5 weakness on the right upper and lower extremities (drift), following simple commands on the right. flaccid left arm and leg with only trace withdrawal to pain Sensory: Unable to assess, neglects left side   ASSESSMENT/PLAN [name] is a 45 y.o. female with history of uncontrolled HTN, tobacco use disorder, uterine fibroids, heart failure, paroxysmal SVT, paranoid psychosis who presents with altered mental status after being found down.  CT head initially read as small acute appearing perforator infarct in the R BG and notable for severe small vessel ischemia. However concerning for potential R MCA stroke and thus CTA head and neck and CTP was obtained with a R MCA M1 occlusion and a core of 50cc and 46ml of penumbra.   Stroke:  right MCA stroke in the setting of right M1 occlusion s/p mechanical thrombectomy with TICI 2 b revascularization Procedure complicated by contrast extravasation at a distal right M3/MCA branch requiring vessel embolization to control SAH.  Clot fragmentation into posterior MS branches s/p aspiration and stent retrieval.  Recanalization of M2 branches  achieved with clot fragmentation to distal parietal M3 branch. Two passes were then performed with direct contact aspiration with microcather. Follow up angiogram showed recanalization with contrast extravasation. Therefore, M3 branch was treated with coil embolization.   CT head: Acute appearing perforator infarct at the right basal ganglia.   CTA neck: large vessel occlusion at the right M1 origin. Left vertebral occlusion at its origin. High-grade narrowings of the right cavernous ICA.  CT perfusion suggestive 50 cc core infarct and 50 cc of adjacent penumbra.  Repeat CT head (5/25): interval evolution of moderately large right MCA distribution infarct. Associated regional mass effect has mildly increased, with new 2 mm of right-to-left midline shift. New parenchymal hypodensity involving the lateral left cerebellum  MRI: large acute right MCA infarct. Associated petechial hemorrhage, subarachnoid hemorrhage, and minimal intraventricular hemorrhage.  Flat panel head CT: small contrast extravasation in the distal right Sylvian fissure and contrast staining of basal ganglia and right frontal cortex, related to ongoing ischemia.  2D Echo 55 to 60%,: moderate concentric left ventricular  hypertrophy  UDS: negative  LDL 76  HgbA1c 5.4  VTE prophylaxis - SCDs    Diet   Diet NPO time specified Except for: Ice Chips    No antithrombotic prior to admission  Therapy recommendations:  CIR  Disposition:  Pending  Cytotoxic cerebral edema  Increase mass effect with new 29mm of right to left midline shift  Hypertonic saline solution increase to 39ml/hr  Na goal 150-160  Check sodium every 6 hours  Dysphagia  Likely due to stroke  SLP following  cortrak placement  Nutrition consult for tube feed recs  Concern for Cervical spine injury:  CT: concern for retropharyngeal fluid and subcutaneous/intramuscular edema in left neck.  MRI cervical spine negative for  fracture  Removed c collar  Paroxysmal SVT history  Restart amiodarone 200mg  daily  Hypertension  Home meds:  Hydralazine 100mg  TID, Imdur 60mg  daily, Toprol XL 200mg  daily  Wean Cleviprex  Add Amlodipine 5mg  daily  Resume home medications  SBP goal to <160 mmHg  Labetalol prn . Long-term BP goal normotensive  Hyperlipidemia  Home meds:  none  LDL 76, goal < 70  Add atorvastatin 20mg  daily  Continue statin at discharge  Diabetes type II Controlled  Home meds:  none  HgbA1c 5.4, goal < 7.0  CBGs  SSI  Other Stroke Risk Factors  Cigarette smoker advised to stop smoking  Coronary artery disease  Other Active Problems  AKD: creatinine 1.27->1.48, NS 571ml bolus given, monitor uop, check labs in am  Hospital day # 2  Holly Bradley, ACNP-BC Stroke NP 05/17/2021  I have personally obtained history,examined this patient, reviewed notes, independently viewed imaging studies, participated in medical decision making and plan of care.ROS completed by me personally and pertinent positives fully documented  I have made any additions or clarifications directly to the above note. Agree with note above.  Continue hypertonic saline with serum sodium goal 150-155.  Keep n.p.o.  Repeat CT scan of the head tomorrow morning.  Resume home blood pressure medications through core track tube and use as needed labetalol and try to wean Cleviprex drip systolic goal less than 170.  No family available at the bedside for discussion.This patient is critically ill and at significant risk of neurological worsening, death and care requires constant monitoring of vital signs, hemodynamics,respiratory and cardiac monitoring, extensive review of multiple databases, frequent neurological assessment, discussion with family, other specialists and medical decision making of high complexity.I have made any additions or clarifications directly to the above note.This critical care time does  not reflect procedure time, or teaching time or supervisory time of PA/NP/Med Resident etc but could involve care discussion time.  I spent 30 minutes of neurocritical care time  in the care of  this patient.     Antony Contras, MD Medical Director [facility] Pager: [phone] 05/17/2021 5:07 PM  To contact Stroke Continuity provider, please refer to [email or web address] After hours, contact General Neurology

## 2021-05-17 NOTE — Progress Notes (Addendum)
This RN called phlebotomy to check on status of sodium lab that was to be drawn 05/16/2021 around 2200.

## 2021-05-17 NOTE — Progress Notes (Signed)
Physical Therapy Treatment Patient Details Name: Holly Bradley MRN: [id number] DOB: 04-09-1976 Today's Date: 05/17/2021    History of Present Illness 45 yo female s/p mechanical thrombectomy on 5/24 of right MCA M1/M2/M3 and embolization of right MCA M3 branch due to contrast extravasation PMH fibriods HTN HF paranoid psychosis recent hospitalizations january and february 2022    PT Comments    Pt is making good, steady progress with PT this date, standing and taking several side steps with mod-maxAx2 and UE support. 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 Pt with ocular motor apraxia, impacting her ability to open her eyes and track her eyes to look to the L (pt maintained a R eye gaze throughout), thereby limiting her ability to utilize extensive visual cues to address her pushing syndrome. However, pt did attempt to acknowledge her proximity to objects on her R to adjust her posture. Pt following 1-2 step commands with increased time. Educated nursing to try to continue discouraging pushing through transferring towards her R. Will continue to follow acutely. Current recommendations remain appropriate.   Follow Up Recommendations  CIR     Equipment Recommendations  3in1 (PT);Wheelchair (measurements PT);Wheelchair cushion (measurements PT);Hospital bed    Recommendations for Other Services       Precautions / Restrictions Precautions Precautions: Fall;Other (comment) Precaution Comments: L hemiparesis, PRAFO, hand splint, BP < 160, pushing syndrome to L Restrictions Weight Bearing Restrictions: No    Mobility  Bed Mobility Overal bed mobility: Needs Assistance Bed Mobility: Supine to Sit     Supine to sit: Max assist;+2 for safety/equipment     General bed mobility comments: Pt needing increased time and multi-modal cues to bring legs off EOB, initiating R better than  L. MaxA to assist L leg off bed, ascend trunk with pt pushing with R UE, and scoot hips to square with EOB.    Transfers Overall transfer level: Needs assistance Equipment used: 2 person hand held assist Transfers: Sit to/from [facility] Sit to Stand: Mod assist;+2 physical assistance;+2 safety/equipment Stand pivot transfers: Max assist;+2 physical assistance;+2 safety/equipment       General transfer comment: Pt cued to lean to R into therapist's chest to decrease L lateral lean. Cued pt to hold onto therapist with R UE to also decrease pushing, modAx2 to power up and steady with sit > stand from EOB 1x and from recliner 1x. Stand step to R against her pushing to discourage it, maxAx2 for shifting weight, steadying, and advancing legs.  Ambulation/Gait Ambulation/Gait assistance: Max assist;+2 physical assistance;+2 safety/equipment Gait Distance (Feet): 2 Feet Assistive device: 2 person hand held assist Gait Pattern/deviations: Decreased step length - right;Decreased step length - left;Decreased stride length;Shuffle;Decreased weight shift to right;Decreased weight shift to left;Staggering left Gait velocity: reduced Gait velocity interpretation: <1.31 ft/sec, indicative of household ambulator General Gait Details: Pt with pushing by R leg and UE towards L, thus in trying to break pushing pt was directed to take steps to R to transfer to chair with UEs supported. Cues to keep her chest with therapist on her R to avoid lateral lean to L. MaxAx2 to steady and direct wieght shifting to advance steps. Bil knee buckling noted.   Stairs             Wheelchair Mobility    Modified Rankin (Stroke Patients Only) Modified Rankin (Stroke Patients Only) Pre-Morbid Rankin Score: No symptoms Modified  Rankin: Severe disability     Balance Overall balance assessment: Needs assistance Sitting-balance support: Single extremity supported;Feet supported;No upper extremity  supported Sitting balance-Leahy Scale: Poor Sitting balance - Comments: Pt with R UE and R leg pushing, thus tried to break it by placing pt on R elbow then R hand laterally placed then R hand on lap, ranging from needing mod-maxA for balance. Postural control: Left lateral lean Standing balance support: Bilateral upper extremity supported;During functional activity Standing balance-Leahy Scale: Zero Standing balance comment: MaxAx2 with bil UE support for standing mobility.                            Cognition Arousal/Alertness: Lethargic Behavior During Therapy: WFL for tasks assessed/performed Overall Cognitive Status: Impaired/Different from baseline Area of Impairment: Following commands;Safety/judgement;Awareness;Problem solving                       Following Commands: Follows multi-step commands inconsistently Safety/Judgement: Decreased awareness of safety;Decreased awareness of deficits Awareness: Intellectual Problem Solving: Slow processing;Decreased initiation;Difficulty sequencing;Requires verbal cues;Requires tactile cues General Comments: Pt with dysarthria, only moaning "yeah" on occasion. Pt with difficulty coordinating movements, displaying ocular apraxia. Poor awareness into her deficits and safety, displaying pushing syndrome patterns. Pt following 1-2 step commands with increased time to process and initiate.      Exercises      General Comments General comments (skin integrity, edema, etc.): VSS on RA      Pertinent Vitals/Pain Pain Assessment: Faces Faces Pain Scale: Hurts little more Pain Location: generalized with mobility Pain Descriptors / Indicators: Moaning;Discomfort Pain Intervention(s): Limited activity within patient's tolerance;Monitored during session;Repositioned    Home Living                      Prior Function            PT Goals (current goals can now be found in the care plan section) Acute Rehab PT  Goals Patient Stated Goal: agreeable to session PT Goal Formulation: With patient Time For Goal Achievement: 05/30/21 Potential to Achieve Goals: Good Progress towards PT goals: Progressing toward goals    Frequency    Min 4X/week      PT Plan Current plan remains appropriate    Co-evaluation              AM-PAC PT "6 Clicks" Mobility   Outcome Measure  Help needed turning from your back to your side while in a flat bed without using bedrails?: A Lot Help needed moving from lying on your back to sitting on the side of a flat bed without using bedrails?: A Lot Help needed moving to and from a bed to a chair (including a wheelchair)?: Total Help needed standing up from a chair using your arms (e.g., wheelchair or bedside chair)?: A Lot Help needed to walk in hospital room?: Total Help needed climbing 3-5 steps with a railing? : Total 6 Click Score: 9    End of Session Equipment Utilized During Treatment: Gait belt Activity Tolerance: Patient tolerated treatment well;Patient limited by lethargy Patient left: with call bell/phone within reach;in chair;with chair alarm set Nurse Communication: Mobility status PT Visit Diagnosis: Hemiplegia and hemiparesis;Other abnormalities of gait and mobility (R26.89);Unsteadiness on feet (R26.81);Muscle weakness (generalized) (M62.81);Difficulty in walking, not elsewhere classified (R26.2);Other symptoms and signs involving the nervous system (R29.898);Apraxia (R48.2) Hemiplegia - Right/Left: Left Hemiplegia - dominant/non-dominant: Non-dominant Hemiplegia - caused by: Cerebral infarction  Time: 1115-5208 PT Time Calculation (min) (ACUTE ONLY): 21 min  Charges:  $Therapeutic Activity: 8-22 mins                     Moishe Spice, PT, DPT Acute Rehabilitation Services  Pager: [phone] Office: [facility] 05/17/2021, 2:52 PM

## 2021-05-17 NOTE — Plan of Care (Signed)
Alerted that patient having large cuff leak on exam.  Went to patient's bedside to assess.  Ventilator graphics would be consistent with cuff leak as inhaled and exhaled tidal volumes disproportionately affected.  Additionally patient with audible cuff leak.  Filled cuff with 10 cc syringe multiple times and pilot balloon would quickly deflate.  Given the above it was determined necessary to proceed with tube exchange.  Tube exchange and confirmed with CO2 detector.  Still there were graphics on the ventilator that would suggest patient with large cuff leak.  Assessed expiratory limb and noted that there was cracked HME.  Cracked HME was then replaced.  After which her vent graphics quickly corrected.  Throughout this patient maintained oxygen saturations in the mid to high 90s.  No complications.  Patient given rocuronium and fentanyl for procedure.  Patient tolerated well.  Chest x-ray ordered will evaluate when it returns.  Newell Coral DO Internal Medicine/Pediatrics Pulmonary and Critical Care Fellow PGY-7

## 2021-05-18 ENCOUNTER — Inpatient Hospital Stay (HOSPITAL_COMMUNITY): Payer: Medicaid Other

## 2021-05-18 DIAGNOSIS — I639 Cerebral infarction, unspecified: Secondary | ICD-10-CM

## 2021-05-18 DIAGNOSIS — J9601 Acute respiratory failure with hypoxia: Secondary | ICD-10-CM

## 2021-05-18 DIAGNOSIS — G9341 Metabolic encephalopathy: Secondary | ICD-10-CM

## 2021-05-18 DIAGNOSIS — D259 Leiomyoma of uterus, unspecified: Secondary | ICD-10-CM

## 2021-05-18 DIAGNOSIS — D62 Acute posthemorrhagic anemia: Secondary | ICD-10-CM

## 2021-05-18 DIAGNOSIS — I63311 Cerebral infarction due to thrombosis of right middle cerebral artery: Secondary | ICD-10-CM | POA: Diagnosis not present

## 2021-05-18 DIAGNOSIS — I6300 Cerebral infarction due to thrombosis of unspecified precerebral artery: Secondary | ICD-10-CM | POA: Diagnosis not present

## 2021-05-18 LAB — BASIC METABOLIC PANEL
Anion gap: 8 (ref 5–15)
BUN: 17 mg/dL (ref 6–20)
CO2: 18 mmol/L — ABNORMAL LOW (ref 22–32)
Calcium: 8.8 mg/dL — ABNORMAL LOW (ref 8.9–10.3)
Chloride: 129 mmol/L — ABNORMAL HIGH (ref 98–111)
Creatinine, Ser: 1.64 mg/dL — ABNORMAL HIGH (ref 0.44–1.00)
GFR, Estimated: 39 mL/min — ABNORMAL LOW (ref >60)
Glucose, Bld: 115 mg/dL — ABNORMAL HIGH (ref 70–99)
Potassium: 3.9 mmol/L (ref 3.5–5.1)
Sodium: 155 mmol/L — ABNORMAL HIGH (ref 135–145)

## 2021-05-18 LAB — GLUCOSE, CAPILLARY
Glucose-Capillary: 112 mg/dL — ABNORMAL HIGH (ref 70–99)
Glucose-Capillary: 126 mg/dL — ABNORMAL HIGH (ref 70–99)
Glucose-Capillary: 117 mg/dL — ABNORMAL HIGH (ref 70–99)
Glucose-Capillary: 125 mg/dL — ABNORMAL HIGH (ref 70–99)
Glucose-Capillary: 116 mg/dL — ABNORMAL HIGH (ref 70–99)
Glucose-Capillary: 132 mg/dL — ABNORMAL HIGH (ref 70–99)

## 2021-05-18 LAB — CBC
HCT: 21.2 % — ABNORMAL LOW (ref 36.0–46.0)
HCT: 24.2 % — ABNORMAL LOW (ref 36.0–46.0)
Hemoglobin: 5.9 g/dL — CL (ref 12.0–15.0)
Hemoglobin: 7.1 g/dL — ABNORMAL LOW (ref 12.0–15.0)
MCH: 20.3 pg — ABNORMAL LOW (ref 26.0–34.0)
MCH: 21.5 pg — ABNORMAL LOW (ref 26.0–34.0)
MCHC: 27.8 g/dL — ABNORMAL LOW (ref 30.0–36.0)
MCHC: 29.3 g/dL — ABNORMAL LOW (ref 30.0–36.0)
MCV: 73.1 fL — ABNORMAL LOW (ref 80.0–100.0)
MCV: 73.3 fL — ABNORMAL LOW (ref 80.0–100.0)
Platelets: 128 10*3/uL — ABNORMAL LOW (ref 150–400)
Platelets: 194 10*3/uL (ref 150–400)
RBC: 2.9 MIL/uL — ABNORMAL LOW (ref 3.87–5.11)
RBC: 3.3 MIL/uL — ABNORMAL LOW (ref 3.87–5.11)
RDW: 18.3 % — ABNORMAL HIGH (ref 11.5–15.5)
RDW: 19.2 % — ABNORMAL HIGH (ref 11.5–15.5)
WBC: 12.7 10*3/uL — ABNORMAL HIGH (ref 4.0–10.5)
WBC: 14 10*3/uL — ABNORMAL HIGH (ref 4.0–10.5)
nRBC: 0.3 % — ABNORMAL HIGH (ref 0.0–0.2)
nRBC: 0.5 % — ABNORMAL HIGH (ref 0.0–0.2)

## 2021-05-18 LAB — PREPARE RBC (CROSSMATCH)

## 2021-05-18 LAB — SODIUM
Sodium: 157 mmol/L — ABNORMAL HIGH (ref 135–145)
Sodium: 157 mmol/L — ABNORMAL HIGH (ref 135–145)
Sodium: 158 mmol/L — ABNORMAL HIGH (ref 135–145)

## 2021-05-18 LAB — TRIGLYCERIDES: Triglycerides: 77 mg/dL (ref <150)

## 2021-05-18 MED ORDER — ORAL CARE MOUTH RINSE
15.0000 mL | OROMUCOSAL | Status: DC
  Administered 2021-05-18 – 2021-06-25 (×361): 15 mL via OROMUCOSAL

## 2021-05-18 MED ORDER — CHLORHEXIDINE GLUCONATE 0.12% ORAL RINSE (MEDLINE KIT)
15.0000 mL | Freq: Two times a day (BID) | OROMUCOSAL | Status: DC
  Administered 2021-05-18 – 2021-07-03 (×90): 15 mL via OROMUCOSAL

## 2021-05-18 MED ORDER — LABETALOL HCL 5 MG/ML IV SOLN
20.0000 mg | INTRAVENOUS | Status: DC | PRN
  Administered 2021-05-18 – 2021-05-22 (×14): 20 mg via INTRAVENOUS
  Filled 2021-05-18 (×10): qty 4

## 2021-05-18 MED ORDER — PANTOPRAZOLE SODIUM 40 MG PO PACK
40.0000 mg | PACK | Freq: Every day | ORAL | Status: DC
  Administered 2021-05-18 – 2021-06-21 (×35): 40 mg
  Filled 2021-05-18 (×35): qty 20

## 2021-05-18 MED ORDER — SODIUM CHLORIDE 0.9% IV SOLUTION: Freq: Once | INTRAVENOUS | Status: AC

## 2021-05-18 MED ORDER — AMLODIPINE BESYLATE 5 MG PO TABS
5.0000 mg | ORAL_TABLET | Freq: Every day | ORAL | Status: DC
  Administered 2021-05-18: 5 mg
  Filled 2021-05-18: qty 1

## 2021-05-18 MED ORDER — METOPROLOL TARTRATE 50 MG PO TABS
100.0000 mg | ORAL_TABLET | Freq: Two times a day (BID) | ORAL | Status: DC
  Administered 2021-05-18 – 2021-05-27 (×20): 100 mg
  Filled 2021-05-18 (×22): qty 2

## 2021-05-18 MED ORDER — ATORVASTATIN CALCIUM 10 MG PO TABS
20.0000 mg | ORAL_TABLET | Freq: Every day | ORAL | Status: DC
  Administered 2021-05-18 – 2021-05-25 (×8): 20 mg
  Filled 2021-05-18 (×8): qty 2

## 2021-05-18 MED ORDER — AMIODARONE HCL 200 MG PO TABS
200.0000 mg | ORAL_TABLET | Freq: Every day | ORAL | Status: DC
  Administered 2021-05-18 – 2021-06-21 (×35): 200 mg
  Filled 2021-05-18 (×35): qty 1

## 2021-05-18 MED ORDER — HYDRALAZINE HCL 50 MG PO TABS
100.0000 mg | ORAL_TABLET | Freq: Three times a day (TID) | ORAL | Status: DC
  Administered 2021-05-18 – 2021-05-21 (×10): 100 mg
  Filled 2021-05-18 (×11): qty 2

## 2021-05-18 MED ORDER — DIPHENHYDRAMINE HCL 50 MG/ML IJ SOLN
12.5000 mg | Freq: Four times a day (QID) | INTRAMUSCULAR | Status: DC | PRN
Start: 2021-05-18 — End: 2021-05-28

## 2021-05-18 MED ORDER — ADULT MULTIVITAMIN W/MINERALS CH
1.0000 | ORAL_TABLET | Freq: Every day | ORAL | Status: DC
  Administered 2021-05-18 – 2021-06-21 (×35): 1
  Filled 2021-05-18 (×35): qty 1

## 2021-05-18 NOTE — Progress Notes (Addendum)
NAME:  Holly Bradley, MRN:  [id number], DOB:  [birth year], LOS: 3  ADMISSION DATE:  05/15/2021, CONSULTATION DATE:  05/18/21 REFERRING MD:  Leonie Man, CHIEF COMPLAINT:  Respiratory failure   History of Present Illness:  [name] is a [name] with PMH of HTN, tobacco use, CHF, DMT2 (controlled with diet and exercise), pSVT, uterine fibroids, paranoid psychosis who presented with AMS on 5/24.   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  On follow up imaging, she had stable appearing SAH and continued R basal ganglia infarct with 22mm R to L shift.   She was admitted to the Neuro ICU and treating with hypertonic saline, statin and BP control.  On 5/26, PCCM consulted when pt became acutely less responsive with respiratory distress along with abdominal distension worse than baseline, per patient.  She was intubated and repeat head CT, abd/pelvis CT ordered.  Repeat head CT on 5/26 showed increased cytotoxic edema and mildly increased leftward midline shift, now 5 mm, decreased SAH and IVH.  Abdominal CT showing 22.9 cm mass, possibly consistent with uterine fibroids, no lymphadenopathy.      Pertinent  Medical History  -CHF -tobacco use -DMT2 -HTN -pSVT -uterine fibroids -paranoid psychosis   Significant Hospital Events: Including procedures, antibiotic start and stop dates in addition to other pertinent events   . 5/24 Admit to neurology after ED visit with AMS, found to have R MCA stroke, underwent mechanical thrombectomy, M3 coil embolization, subsequent SAH . 5/26 decreased mental status and respiratory distress, intubated   Interim History / Subjective:  5/27- Overnight, patient received 1 unit of PRBCs due to hematocrit of 21.2, down from 23.0.  She remained on PRVC at a PEEP of 5 and FiO2 of 40%,  sedated with propofol at 40 and fentanyl at 150, thick tan secretions noted, none this AM.  She required a tube change due to discrepancies between tidal volumes consistent with cuff leak, found to have cracked HME, no other issues after this was exchanged.  Sedation off this morning due to RASS of -4 and for SBT today.  She has achieved SBP goal of <160 overnight.  0900 Na 157, discontinue 3% saline, per neurology.  Objective   Blood pressure (!) 162/82, pulse 92, temperature 100 F (37.8 C), temperature source Axillary, resp. rate 15, height 5\' 5"  (1.651 m), weight 76.3 kg, SpO2 99 %.    Vent Mode: PRVC FiO2 (%):  [40 %] 40 % Set Rate:  [20 bmp] 20 bmp Vt Set:  [440 mL-450 mL] 450 mL PEEP:  [5 cmH20] 5 cmH20 Plateau Pressure:  [17 cmH20-20 cmH20] 17 cmH20   Intake/Output Summary (Last 24 hours) at 05/18/2021 1023 Last data filed at 05/18/2021 0900 Gross per 24 hour  Intake 4654.3 ml  Output 150 ml  Net 4504.3 ml   Filed Weights   05/15/21 0515 05/18/21 0359  Weight: 72.6 kg 76.3 kg    General:  Somnolent female, lying in bed, intubated  HEENT: MM pink/moist, ETT in place, prominent periorbital edema Neuro: Somnolent after sedation turned off in early AM, minimally responsive to noxious stimuli with movement of RLE and fluttering of eyelids, unresponsive to verbal and tactile stim CV: S1S2. RRR, no m/r/g PULM:  CTAB, normal WOB, on full vent support  GI: soft, distended with large, firm mass Extremities: warm/dry, trace LE edema  Skin: no rashes or lesions  Labs/imaging that I havepersonally reviewed  (right click and "Reselect all SmartList Selections" daily)  CBC BMP, TGs ABG 5/26 CXR 1&2 5/26 Head CT 5/26 Abd/Pelvis CT  Assessment & Plan:   Acute Respiratory Failure and worsening AMS in the setting of acute large R MCA infarct with R sylvan fissure SAH and midline shift P: -Stroke management per neurology, discontinue hypertonic saline due to recent Na of 157, goal Na  150-155 -Continue with SBP goal of <160, per neurology  -Maintain full vent support with SAT/SBT as tolerated -Titrate vent setting to maintain SpO2 greater than or equal to 90%. -HOB elevated 30 degrees. -Plateau pressures less than 30 cm H20.  -Follow chest x-ray, ABG prn.   -Bronchial hygiene and RT/bronchodilator protocol.  Fever (Tmax 102.6 on 5/25) in setting of acute respiratory distress requiring intubation, new leukocytosis -Will send tracheal aspirate   Abdominal distension, likely related to history of uterine fibroids: P:  -Continue to monitor with abdominal exams  Acute blood loss anemia - secondary to uterine fibroids - has ongoing vaginal bleeding, monitor closely - as outpatient will need surgery  - transfuse goal hgb>7  Best practice (right click and "Reselect all SmartList Selections" daily)  Diet:  NPO, tube feeds Pain/Anxiety/Delirium protocol (if indicated): Yes (RASS goal -1) VAP protocol (if indicated): Yes DVT prophylaxis: SCD GI prophylaxis: PPI Glucose control:  SSI Yes Central venous access:  N/A Arterial line:  N/A Foley:  N/A Mobility:  bed rest  PT consulted: Yes Last date of multidisciplinary goals of care discussion [pending] Code Status:  full code Disposition: ICU  Labs   CBC: Recent Labs  Lab 05/15/21 0430 05/15/21 1221 05/17/21 1817 05/18/21 0127  WBC 10.2  --   --  12.7*  NEUTROABS 8.7*  --   --   --   HGB 8.0* 7.8* 7.8* 5.9*  HCT 27.4* 26.5* 23.0* 21.2*  MCV 70.8*  --   --  73.1*  PLT 356  --   --  128*    Basic Metabolic Panel: Recent Labs  Lab 05/15/21 0430 05/16/21 1056 05/17/21 0415 05/17/21 1023 05/17/21 1600 05/17/21 1817 05/18/21 0127  NA 140   < > 150* 150* 152* 158* 155*  K 3.3*  --  3.5  --   --  3.3* 3.9  CL 108  --  123*  --   --   --  129*  CO2 21*  --  16*  --   --   --  18*  GLUCOSE 114*  --  127*  --   --   --  115*  BUN 17  --  14  --   --   --  17  CREATININE 1.27*  --  1.48*  --   --   --   1.64*  CALCIUM 9.3  --  8.6*  --   --   --  8.8*  MG  --   --  2.0  --   --   --   --   PHOS  --   --  3.3  --   --   --   --    < > = values in this interval not displayed.   GFR: Estimated Creatinine Clearance: 43.3 mL/min (A) (by C-G formula based on SCr of 1.64 mg/dL (H)). Recent Labs  Lab 05/15/21 0430 05/18/21 0127  WBC 10.2 12.7*    Liver Function Tests: Recent Labs  Lab 05/15/21 0430  AST 26  ALT 19  ALKPHOS 88  BILITOT 1.0  PROT 7.6  ALBUMIN 3.5    Recent Labs  Lab 05/15/21 0438  AMMONIA <9*    ABG    Component Value Date/Time   PHART 7.333 (L) 05/17/2021 1817   PCO2ART 33.7 05/17/2021 1817   PO2ART 156 (H) 05/17/2021 1817   HCO3 17.6 (L) 05/17/2021 1817   TCO2 19 (L) 05/17/2021 1817   ACIDBASEDEF 7.0 (H) 05/17/2021 1817   O2SAT 99.0 05/17/2021 1817     HbA1C: Hgb A1c MFr Bld  Date/Time Value Ref Range Status  05/16/2021 04:57 AM 5.4 4.8 - 5.6 % Final    Comment:    (NOTE)         Prediabetes: 5.7 - 6.4         Diabetes: >6.4         Glycemic control for adults with diabetes: <7.0     CBG: Recent Labs  Lab 05/17/21 1529 05/17/21 1910 05/17/21 2321 05/18/21 0306 05/18/21 0711  GLUCAP 136* 108* 124* 112* 126*    Critical care time: 35 minutes    Toya Smothers, MS4  The patient is critically ill due to respiratory failure, encephalopathy, stroke, subarachnoid hemorrhage, anemia.  Critical care was necessary to treat or prevent imminent or life-threatening deterioration.  Critical care was time spent personally by me on the following activities: development of treatment plan with patient and/or surrogate as well as nursing, discussions with consultants, evaluation of patient's response to treatment, examination of patient, obtaining history from patient or surrogate, ordering and performing treatments and interventions, ordering and review of laboratory studies, ordering and review of radiographic studies, pulse oximetry, re-evaluation  of patient's condition and participation in multidisciplinary rounds.   Critical Care Time devoted to patient care services described in this note is 35 minutes. This time reflects time of care of this [place] . This critical care time does not reflect separately billable procedures or procedure time, teaching time or supervisory time of PA/NP/Med student/Med Resident etc but could involve care discussion time.       Spero Geralds [facility] 05/18/2021 2:00 PM  Pager: see AMION  If no response to pager , please call critical care on call (see AMION) until 7pm After 7:00 pm call Elink

## 2021-05-18 NOTE — Progress Notes (Addendum)
eLink Physician-Brief Progress Note [name] DOB: [birth year] MRN: [id number]   Date of Service  05/18/2021  HPI/Events of Note  Hemoglobin 5.9 gm / dl, down from 7.8 gm / dl, no obvious focus of bleeding.  eICU Interventions  Transfuse one unit PRBC. Informed consent for the blood transfusion obtained from patient's brother Mr. Hedy Camara.        Gilberto Stanforth U Marcellous Snarski 05/18/2021, 3:20 AM

## 2021-05-18 NOTE — Procedures (Signed)
[name]  MRN: [id number]  Epilepsy Attending: Lora Havens  Referring Physician/Provider: Charlene Brooke, NP Date: 05/18/2021 Duration: 23.25 mins  Patient history: 45yo F with right MCA infarct. EEG to evaluate for seizure.   Level of alertness: Awake, asleep  AEDs during EEG study: Propofol  Technical aspects: This EEG study was done with scalp electrodes positioned according to the 10-20 International system of electrode placement. Electrical activity was acquired at a sampling rate of 500Hz  and reviewed with a high frequency filter of 70Hz  and a low frequency filter of 1Hz . EEG data were recorded continuously and digitally stored.   Description: The posterior dominant rhythm consists of 7 Hz activity of moderate voltage (25-35 uV) seen predominantly in posterior head regions, symmetric and reactive to eye opening and eye closing.  Sleep was characterized by sleep spindles (12 to 14 Hz), maximal frontocentral region. EEG showed continuous generalized and lateralized right hemisphere predominantly 5 to 6 Hz theta as well as intermittent 2 to 3 Hz delta slowing.  Hyperventilation and photic stimulation were not performed.     ABNORMALITY - Continuous slow, generalized and lateralized right hemisphere -Background slow  IMPRESSION: This study is suggestive of cortical dysfunction in right hemisphere likely secondary to underlying infarct as well as moderate diffuse encephalopathy, nonspecific etiology.  No seizures or definite epileptiform discharges were seen throughout the recording.  Quadry Kampa [name]

## 2021-05-18 NOTE — Progress Notes (Signed)
STROKE TEAM PROGRESS NOTE   INTERVAL HISTORY CT repeated yesterday and found stable hemorrhagic infarct with slight increase in right left midline shift.  She remains on hypertonic saline drip.  With serum sodium at goal at 155.  Extremely large abdominopelvic mass measuring up to 22.9 cm on CT CAP yesterday.  Declined yesterday requiring intubation and then tube change overnight. Also with anemia (hgb 5.9) requiring transfusion this morning.  Remains drowsy but can be aroused with some difficulty to follow occasional commands on the right.  Purposeful on the right side today, brief eye opening with sternal rub. Anemic overnight. Seen at bedside along with CCM team.  Her neurological exam is stable. Remains with eye-opening apraxia, can follow commands well on the right side though she continues to have significant right gaze preference, left-sided neglect and dense left hemiplegia.  Blood pressure adequately controlled. Cleviprex off this am. Sedation held since early am. Planning for TCD bubble study today.  No visitors at bedside during rounds.   Vitals:   05/18/21 0704 05/18/21 0730 05/18/21 0733 05/18/21 0800  BP:  (!) 147/69 (!) 147/69 (!) 155/70  Pulse: 89 89 89 90  Resp: 20 19 20 19   Temp: 100 F (37.8 C)   100 F (37.8 C)  TempSrc: Oral   Axillary  SpO2: 99% 98% 98% 98%  Weight:      Height:       CBC:  Recent Labs  Lab 05/15/21 0430 05/15/21 1221 05/17/21 1817 05/18/21 0127  WBC 10.2  --   --  12.7*  NEUTROABS 8.7*  --   --   --   HGB 8.0*   < > 7.8* 5.9*  HCT 27.4*   < > 23.0* 21.2*  MCV 70.8*  --   --  73.1*  PLT 356  --   --  128*   < > = values in this interval not displayed.   Basic Metabolic Panel:  Recent Labs  Lab 05/17/21 0415 05/17/21 1023 05/17/21 1817 05/18/21 0127  NA 150*   < > 158* 155*  K 3.5  --  3.3* 3.9  CL 123*  --   --  129*  CO2 16*  --   --  18*  GLUCOSE 127*  --   --  115*  BUN 14  --   --  17  CREATININE 1.48*  --   --  1.64*   CALCIUM 8.6*  --   --  8.8*  MG 2.0  --   --   --   PHOS 3.3  --   --   --    < > = values in this interval not displayed.   Lipid Panel:  Recent Labs  Lab 05/16/21 0457 05/18/21 0127  CHOL 131  --   TRIG 178* 77  HDL 19*  --   CHOLHDL 6.9  --   VLDL 36  --   LDLCALC 76  --    HgbA1c:  Recent Labs  Lab 05/16/21 0457  HGBA1C 5.4   Urine Drug Screen:  Recent Labs  Lab 05/15/21 0445  LABOPIA NONE DETECTED  COCAINSCRNUR NONE DETECTED  LABBENZ NONE DETECTED  AMPHETMU NONE DETECTED  THCU NONE DETECTED  LABBARB NONE DETECTED    Alcohol Level  Recent Labs  Lab 05/15/21 0438  ETH <10    IMAGING  CT HEAD WO CONTRAST Result Date: 05/16/2021  IMPRESSION:  1. Continued interval evolution of moderately large right MCA distribution infarct. Associated regional mass effect  has mildly increased, with new 2 mm of right-to-left midline shift. No hydrocephalus or trapping.  2. Decreased prominence of subarachnoid hemorrhage at the posterior right Sylvian fissure and overlying right frontoparietal region. Trace intraventricular hemorrhage related to redistribution.  3. Question new area of parenchymal hypodensity involving the lateral left cerebellum. While this finding is favored to be artifactual, a possible new acute ischemic infarct is difficult to exclude, and could be considered in the correct clinical setting. Attention at follow-up recommended.  4. No other new acute intracranial abnormality.   CT HEAD WO CONTRAST Result Date: 05/15/2021  IMPRESSION: Small-to-moderate volume hyperdensity within the right sylvian fissure and along portions of the right frontoparietal and temporal lobes compatible with extravasated contrast/subarachnoid hemorrhage. There is hyperdensity within the right basal ganglia, right insula, lateral right frontal lobe/frontal operculum and within portions of the right parietal lobe likely reflecting contrast staining at sites of acute infarction. Mild  associated mass effect with subtle partial effacement of the right lateral ventricle. No midline shift or hydrocephalus. Embolization material now overlies the lateral right parietal lobe. Stable background parenchymal atrophy and chronic small vessel ischemic disease. Redemonstrated chronic infarct within the superior left cerebellum.   MR BRAIN WO CONTRAST Result Date: 05/15/2021 IMPRESSION:  1. Moderately large acute right MCA infarct. Associated petechial hemorrhage, subarachnoid hemorrhage, and minimal intraventricular hemorrhage.  2. Severe chronic small vessel ischemic disease with multiple chronic infarcts.   MR CERVICAL SPINE WO CONTRAST Result Date: 05/15/2021  IMPRESSION: The cord itself appears normal. No disc disease. No spinal stenosis. 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 No significant encroachment upon the neural spaces. Some prevertebral soft tissue edema. In this clinical scenario, this may be subsequent to previous endotracheal intubation. One could not rule out pharyngitis or cellulitis but that seems less likely. I do not see any traumatic injury to the cervical spine itself. Electronically Signed   By: Nelson Chimes M.D.   On: 05/15/2021 16:05   IR CT Head Ltd Result Date: 05/15/2021 CT of the head was obtained and post processed in a separate workstation with concurrent attending physician supervision. Selected images were sent to PACS. Small right sylvian contrast extravasation confirmed. Additionally, hyperdensity of the right basal ganglia and lateral right frontal cortex are noted, related to ongoing ischemia. Right internal carotid artery angiograms with frontal and lateral views of the head showed occlusion of the embolized branch with no evidence of contrast extravasation. Patency of the remaining MCA branches noted.  IR PERCUTANEOUS ART THROMBECTOMY/INFUSION INTRACRANIAL INC DIAG ANGIO Result Date: 05/15/2021 IMPRESSION: Mechanical thrombectomy  performed for treatment of right M1/MCA occlusion with clot fragmentation and embolization to same distal territory requiring multiple additional passes. Procedure complicated by contrast extravasation at a distal right M3/MCA branch requiring vessel embolization. Near complete recanalization of the right MCA vascular tree was achieved (TICI 2b). PLAN: - Bed rest post femoral access x6h - Follow-up CT in 4 hours to evaluate for SAH/contrast extravasation stability - Transfer to ICU- SBP: 120-140 mmHg   Echo Result read: 05/16/2021 1. Left ventricular ejection fraction, by estimation, is 55 to 60%. The  left ventricle has normal function. The left ventricle has no regional  wall motion abnormalities. There is moderate concentric left ventricular  hypertrophy. Left ventricular  diastolic parameters are consistent with Grade II diastolic dysfunction  (pseudonormalization). Elevated left atrial pressure.  2. Right ventricular systolic function is normal. The right ventricular  size is normal. Mildly increased right ventricular wall thickness. There  is moderately elevated pulmonary artery systolic pressure. The estimated  right ventricular systolic pressure  is 39.7 mmHg.  3. Left atrial size was severely dilated.  4. Right atrial size was severely dilated.  5. There is no evidence of cardiac tamponade.  6. The mitral valve is normal in structure. Trivial mitral valve  regurgitation. No evidence of mitral stenosis.  7. The aortic valve is normal in structure. There is mild calcification  of the aortic valve. There is mild thickening of the aortic valve. Aortic  valve regurgitation is not visualized. Mild aortic valve sclerosis is  present, with no evidence of aortic  valve stenosis.  8. The inferior vena cava is dilated in size with <50% respiratory  variability, suggesting right atrial pressure of 15 mmHg.   PHYSICAL EXAM General:  Pleasant middle-aged African-American lady laying  comfortably in bed; in no acute distress.  She is wearing a cervical collar and is intubated and sedated  HENT: Normal oropharynx and mucosa. Normal external appearance of ears and nose. Neck: Supple, no pain or tenderness CV: No JVD. No peripheral edema. Pulmonary: Symmetric Chest rise. Normal respiratory effort. Abdomen: Soft to touch, non-tender. Ext: No cyanosis, edema, or deformity Skin: No rash. Normal palpation of skin.  Musculoskeletal: Normal digits and nails by inspection. No clubbing.  Mental status/Cognition:  Patient is intubated and sedated  Eyes closed. Eye opening apraxia. Does not answer orientation questions.  Right gaze palsy.  left hemineglect. Speech/language: Non fluent, comprehension intact to simple commands. Unable to repeat or name objects.  Mild dysarthria Cranial Nerves: II: Unable to assess Visual Fields . Pupils are equal, round, and reactive to light.  III,IV, VI: Right gaze deviation unable to cross midline V: unable to assess facial sensation  VII:She has mild right lower facial weakness VIII: hearing appears to be intact to voice X: unable to assess uvula XI: does not shrug shoulder. XII: does not stick out tongue Motor: She has 3/5 weakness on the right upper and lower extremities (drift), following simple commands on the right. flaccid left arm and leg with only trace withdrawal to pain Sensory: Unable to assess, neglects left side   ASSESSMENT/PLAN Holly Bradley is a 45 y.o. female with history of uncontrolled HTN, tobacco use disorder, uterine fibroids, heart failure, paroxysmal SVT, paranoid psychosis who presents with altered mental status after being found down.  CT head initially read as small acute appearing perforator infarct in the R BG and notable for severe small vessel ischemia. However concerning for potential R MCA stroke and thus CTA head and neck and CTP was obtained with a R MCA M1 occlusion and a core of 50cc and 36ml of  penumbra.   Stroke:  right MCA stroke in the setting of right M1 occlusion s/p mechanical thrombectomy with TICI 2 b revascularization Procedure complicated by contrast extravasation at a distal right M3/MCA branch requiring vessel embolization to control SAH.  Clot fragmentation into posterior MS branches s/p aspiration and stent retrieval.  Recanalization of M2 branches achieved with clot fragmentation to distal parietal M3 branch. Two passes were then performed with direct contact aspiration with microcather. Follow up angiogram showed recanalization with contrast extravasation. Therefore, M3 branch was treated with coil embolization.   CT head: Acute appearing perforator infarct at the right basal ganglia.   CTA neck: large vessel occlusion at the right M1 origin. Left vertebral occlusion at its origin. High-grade narrowings of the right cavernous ICA.  CT perfusion suggestive 50 cc core infarct and  50 cc of adjacent penumbra.  Repeat CT head (5/25): interval evolution of moderately large right MCA distribution infarct. Associated regional mass effect has mildly increased, with new 2 mm of right-to-left midline shift. New parenchymal hypodensity involving the lateral left cerebellum  MRI: large acute right MCA infarct. Associated petechial hemorrhage, subarachnoid hemorrhage, and minimal intraventricular hemorrhage.  Flat panel head CT: small contrast extravasation in the distal right Sylvian fissure and contrast staining of basal ganglia and right frontal cortex, related to ongoing ischemia.  2D Echo 55 to 60%,: moderate concentric left ventricular  hypertrophy  UDS: negative  LDL 76  HgbA1c 5.4  Sickle cell trait is pending  ANA pending  VTE prophylaxis - SCDs    Diet   Diet NPO time specified Except for: Ice Chips    No antithrombotic prior to admission  Therapy recommendations:  CIR  Disposition:  Pending  Cytotoxic cerebral edema  Increase mass effect with new  69mm of right to left midline shift  Hypertonic saline solution decreased overnight to 30 cc/hr with sodium 158  Na goal 150-160  Check sodium every 6 hours  Respiratory failure  Intubated 5/26 for increased work of breathing with altered mental status   Management per CCM, appreciated  Acute blood loss anemia  hgb 7.8->5.9  One unit PRBC tranfusion per CCM  No clear source of bleeding  Monitor trend   Dysphagia  Likely due to stroke  SLP following  cortrak placement  Nutrition consult for tube feed recs  Large Abdomino/pelvic mass  Extremely large abdominopelvic mass measuring up to 22.9 cm noted on CT 5/26  Abdominal distention on exam   Concern for Cervical spine injury:  CT: concern for retropharyngeal fluid and subcutaneous/intramuscular edema in left neck.  MRI cervical spine negative for fracture  Removed c collar  Acute blood loss anemia  Hbg 8.0->7.8->5.9   Transfusion 5/27 1u PRBCs  Monitor trend  No clear source   Paroxysmal SVT history  Restart amiodarone 200mg  daily  Hypertension  Home meds:  Hydralazine 100mg  TID, Imdur 60mg  daily, Toprol XL 200mg  daily  Wean Cleviprex  Add Amlodipine 5mg  daily  Resume home medications  SBP goal to <160 mmHg  Labetalol prn . Long-term BP goal normotensive  Hyperlipidemia  Home meds:  none  LDL 76, goal < 70  Add atorvastatin 20mg  daily  Continue statin at discharge  Diabetes type II Controlled  Home meds:  none  HgbA1c 5.4, goal < 7.0  CBGs  SSI  Other Stroke Risk Factors  Cigarette smoker advised to stop smoking  Coronary artery disease  Other Active Problems  AKD: creatinine 1.27->1.48, NS 564ml bolus given, monitor uop, check labs in am  I have personally obtained history,examined this patient, reviewed notes, independently viewed imaging studies, participated in medical decision making and plan of care.ROS completed by me personally and pertinent positives  fully documented  I have made any additions or clarifications directly to the above note. Agree with note above.  Patient's condition remains critical and she is on mechanical ventilation for respiratory failure.  She was transfused overnight for decreased hematocrit and CT abdomen pelvis showed no source of bleeding but large mass which is likely of 5 right.  Continue ventilatory support and extubate as per CCM.  Continue hypertonic saline with serum sodium goal 150-155.  Check transcranial Doppler bubble study for PFO.  Continue strict blood pressure control with systolic goal below 629.  Wean Cleviprex drip.  Use as needed IV labetalol and continue  oral blood pressure medications through core track tube.  No family at the bedside.  I spoke to her brother over the phone and gave her an update and answered questions.  Discussed with Dr. Teressa Senter at the psych critical care medicine this patient is critically ill and at significant risk of neurological worsening, death and care requires constant monitoring of vital signs, hemodynamics,respiratory and cardiac monitoring, extensive review of multiple databases, frequent neurological assessment, discussion with family, other specialists and medical decision making of high complexity.I have made any additions or clarifications directly to the above note.This critical care time does not reflect procedure time, or teaching time or supervisory time of PA/NP/Med Resident etc but could involve care discussion time.  I spent 30 minutes of neurocritical care time  in the care of  this patient.     Antony Contras, MD Medical Director [place] Pager: [phone] 05/18/2021 3:32 PM  To contact Stroke Continuity provider, please refer to [email or web address] After hours, contact General Neurology

## 2021-05-18 NOTE — Progress Notes (Signed)
TCD bubble study completed. Refer to "CV Proc" under chart review to view preliminary results.  05/18/2021 2:53 PM Kelby Aline., MHA, RVT, RDCS, RDMS

## 2021-05-18 NOTE — Progress Notes (Signed)
PT Cancellation Note  Patient Details Name: Holly Bradley MRN: [id number] DOB: Feb 25, 1976   Cancelled Treatment:    Reason Eval/Treat Not Completed: Patient's level of consciousness;Medical issues which prohibited therapy. Pt has been re-intubated and is at a very low level of arousal. RN requesting hold off on PT at this time. Will follow-up as appropriate.   Moishe Spice, PT, DPT Acute Rehabilitation Services  Pager: [phone] Office: [place] 05/18/2021, 8:35 AM

## 2021-05-18 NOTE — Progress Notes (Signed)
SLP Cancellation Note  Patient Details Name: Holly Bradley MRN: [id number] DOB: 08/02/1976   Cancelled treatment:        Now has ETT. Will follow for appropriateness.    [name] 05/18/2021, 9:15 AM   [name].Ed Risk analyst [phone] Office [phone]

## 2021-05-18 NOTE — Progress Notes (Signed)
OT Cancellation Note  Patient Details Name: Ladasha Schnackenberg MRN: [id number] DOB: 1976/11/27   Cancelled Treatment:    Reason Eval/Treat Not Completed: Patient not medically ready (reintubated and low arousal)  Billey Chang, OTR/L  Acute Rehabilitation Services Pager: [phone] Office: [phone] .  05/18/2021, 8:35 AM

## 2021-05-18 NOTE — Progress Notes (Signed)
EEG completed, results pending. 

## 2021-05-18 NOTE — Progress Notes (Signed)
Most recent sodium lab draw from 0900 : 157. Informed MD Leonie Man. Verbal order received to stop 3% infusion at this time.

## 2021-05-18 NOTE — Progress Notes (Signed)
Nutrition Follow-up  DOCUMENTATION CODES:   Not applicable  INTERVENTION:    Tube feeding via Cortrak: Osmolite 1.5 at 50 ml/h (1200 ml per day) Prosource TF 45 ml BID  Provides 1880 kcal, 97 gm protein, 912 ml free water daily  MVI with minerals  NUTRITION DIAGNOSIS:   Inadequate oral intake related to inability to eat as evidenced by NPO status. Ongoing.   GOAL:   Patient will meet greater than or equal to 90% of their needs Met with TF.   MONITOR:   TF tolerance,Labs  REASON FOR ASSESSMENT:   Consult Enteral/tube feeding initiation and management  ASSESSMENT:   Pt with PMH of uncontrolled HTN, tobacco abuse, uterine fibroids, CHF, paroxysmal SVT, and paranoid psychosis admitted with significant encephalopathy and L sided weakness. 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   Pt discussed during ICU rounds and with RN.  Pt intubated due to increasing WOB/SOB. No bowel movement yet. Per abd CT small bowel and colon are markedly displaced due to an extremely large abdominopelvic mass. 22.9 x 22.9 x 13.8 cm soft tissue mass fills the pelvis and lower abdomen.  5/25 pt failed swallow eval; cortrak placed with tip gastric 5/26 pt intubated   Patient is currently intubated on ventilator support MV: 10.6 L/min Temp (24hrs), Avg:100.2 F (37.9 C), Min:99.4 F (37.4 C), Max:101.2 F (38.4 C)   Medications reviewed and include: colace, protonix, miralax Fentanyl  Hypertonic saline @ 30 ml/hr Cleviprex @ 56 ml/hr provides: 2688 kcal  --> currently off  Labs reviewed: Na 155     Diet Order:   Diet Order            Diet NPO time specified Except for: Ice Chips  Diet effective now                 EDUCATION NEEDS:   No education needs have been identified at this time  Skin:  Skin Assessment: Reviewed RN Assessment  Last BM:  unknown  Height:   Ht Readings from Last 1 Encounters:   05/15/21 _0  (1.651 m)    Weight:   Wt Readings from Last 1 Encounters:  05/18/21 76.3 kg    Ideal Body Weight:     BMI:  Body mass index is 27.99 kg/m.  Estimated Nutritional Needs:   Kcal:  1850-2100  Protein:  90-105 grams  Fluid:  >1.8 L/day  Lockie Pares., RD, LDN, CNSC See [facility] for contact information

## 2021-05-18 NOTE — Progress Notes (Signed)
[facility] Progress Note [name] DOB: [birth year] MRN: [id number]   Date of Service  05/18/2021  HPI/Events of Note  Patient with head and neck edema that may be related to snugness of ET tube holder around her head and neck. No swelling or rash involving the rest of patient's body and no rash over the head and neck.  eICU Interventions  RT asked to adjust ET tube holder tension, PRN Benadryl ordered in the less likely event that etiology is allergy.        Frederik Pear 05/18/2021, 10:46 PM

## 2021-05-19 ENCOUNTER — Inpatient Hospital Stay (HOSPITAL_COMMUNITY): Payer: Medicaid Other

## 2021-05-19 DIAGNOSIS — E870 Hyperosmolality and hypernatremia: Secondary | ICD-10-CM

## 2021-05-19 DIAGNOSIS — I639 Cerebral infarction, unspecified: Secondary | ICD-10-CM

## 2021-05-19 DIAGNOSIS — F17200 Nicotine dependence, unspecified, uncomplicated: Secondary | ICD-10-CM

## 2021-05-19 DIAGNOSIS — I609 Nontraumatic subarachnoid hemorrhage, unspecified: Secondary | ICD-10-CM

## 2021-05-19 DIAGNOSIS — G936 Cerebral edema: Secondary | ICD-10-CM

## 2021-05-19 DIAGNOSIS — I169 Hypertensive crisis, unspecified: Secondary | ICD-10-CM

## 2021-05-19 DIAGNOSIS — I6300 Cerebral infarction due to thrombosis of unspecified precerebral artery: Secondary | ICD-10-CM

## 2021-05-19 DIAGNOSIS — N179 Acute kidney failure, unspecified: Secondary | ICD-10-CM

## 2021-05-19 DIAGNOSIS — G9341 Metabolic encephalopathy: Secondary | ICD-10-CM | POA: Diagnosis not present

## 2021-05-19 LAB — TYPE AND SCREEN
ABO/RH(D): O POS
Antibody Screen: NEGATIVE
Donor AG Type: NEGATIVE
Donor AG Type: NEGATIVE
Unit division: 0
Unit division: 0

## 2021-05-19 LAB — GLUCOSE, CAPILLARY
Glucose-Capillary: 124 mg/dL — ABNORMAL HIGH (ref 70–99)
Glucose-Capillary: 151 mg/dL — ABNORMAL HIGH (ref 70–99)
Glucose-Capillary: 119 mg/dL — ABNORMAL HIGH (ref 70–99)
Glucose-Capillary: 143 mg/dL — ABNORMAL HIGH (ref 70–99)
Glucose-Capillary: 107 mg/dL — ABNORMAL HIGH (ref 70–99)
Glucose-Capillary: 137 mg/dL — ABNORMAL HIGH (ref 70–99)

## 2021-05-19 LAB — CBC
HCT: 26.4 % — ABNORMAL LOW (ref 36.0–46.0)
Hemoglobin: 7.6 g/dL — ABNORMAL LOW (ref 12.0–15.0)
MCH: 21.2 pg — ABNORMAL LOW (ref 26.0–34.0)
MCHC: 28.8 g/dL — ABNORMAL LOW (ref 30.0–36.0)
MCV: 73.5 fL — ABNORMAL LOW (ref 80.0–100.0)
Platelets: 243 10*3/uL (ref 150–400)
RBC: 3.59 MIL/uL — ABNORMAL LOW (ref 3.87–5.11)
RDW: 19.8 % — ABNORMAL HIGH (ref 11.5–15.5)
WBC: 23.1 10*3/uL — ABNORMAL HIGH (ref 4.0–10.5)
nRBC: 0.3 % — ABNORMAL HIGH (ref 0.0–0.2)

## 2021-05-19 LAB — BPAM RBC
Blood Product Expiration Date: 202206122359
Blood Product Expiration Date: 202206122359
ISSUE DATE / TIME: 202205270359
Unit Type and Rh: 5100
Unit Type and Rh: 5100

## 2021-05-19 LAB — BASIC METABOLIC PANEL
BUN: 20 mg/dL (ref 6–20)
CO2: 18 mmol/L — ABNORMAL LOW (ref 22–32)
Calcium: 9.3 mg/dL (ref 8.9–10.3)
Chloride: >130 mmol/L (ref 98–111)
Creatinine, Ser: 1.54 mg/dL — ABNORMAL HIGH (ref 0.44–1.00)
GFR, Estimated: 42 mL/min — ABNORMAL LOW (ref >60)
Glucose, Bld: 128 mg/dL — ABNORMAL HIGH (ref 70–99)
Potassium: 4.3 mmol/L (ref 3.5–5.1)
Sodium: 156 mmol/L — ABNORMAL HIGH (ref 135–145)

## 2021-05-19 LAB — SODIUM
Sodium: 157 mmol/L — ABNORMAL HIGH (ref 135–145)
Sodium: 157 mmol/L — ABNORMAL HIGH (ref 135–145)
Sodium: 158 mmol/L — ABNORMAL HIGH (ref 135–145)

## 2021-05-19 LAB — SICKLE CELL SCREEN: Sickle Cell Screen: NEGATIVE

## 2021-05-19 IMAGING — CT CT HEAD W/O CM
3 series · 15 of 47 positions shown, 18 images · non-contrast
Comparison: Two days ago

CLINICAL DATA: Stroke follow-up

EXAM:
CT HEAD WITHOUT CONTRAST
TECHNIQUE: Contiguous axial images were obtained from the base of the skull
through the vertex without intravenous contrast.

[Series 4: head 5.0 h30s · axial · 0.42mm/px · z∈[-83,+47]mm · 9 of 32 slices shown, 12 images]
[im 3/32  brain]
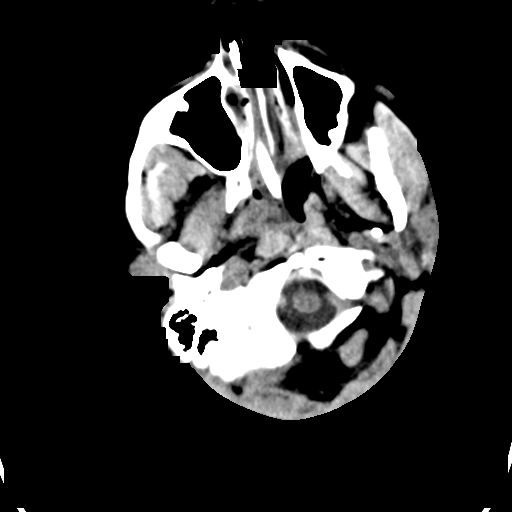
[im 3/32  bone]
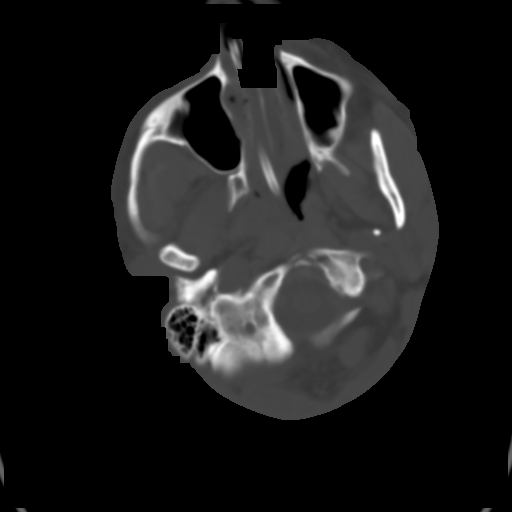
[im 6/32  brain]
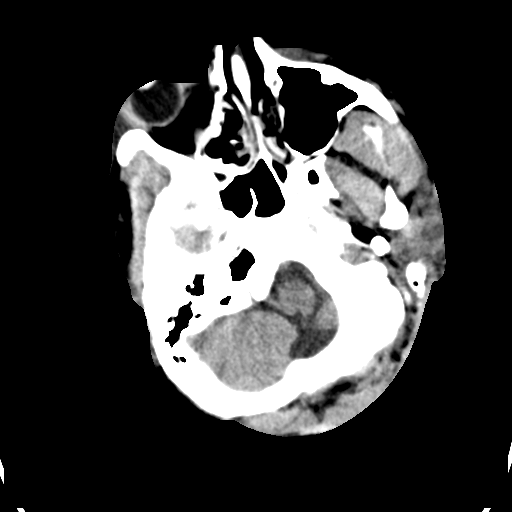
[im 9/32  brain]
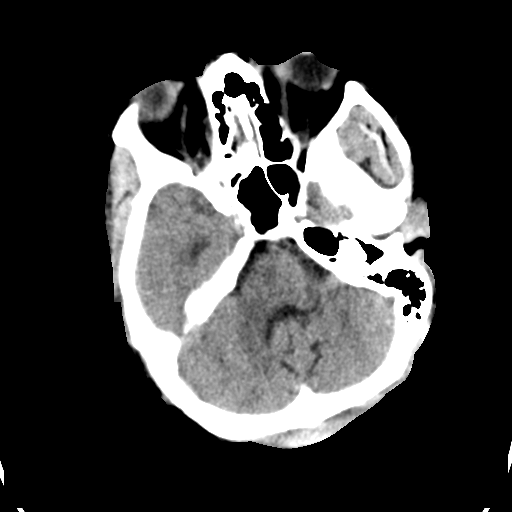
[im 12/32  brain]
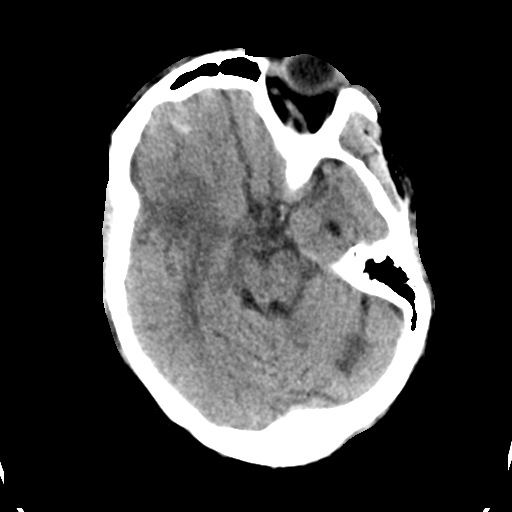
[im 17/32  brain]
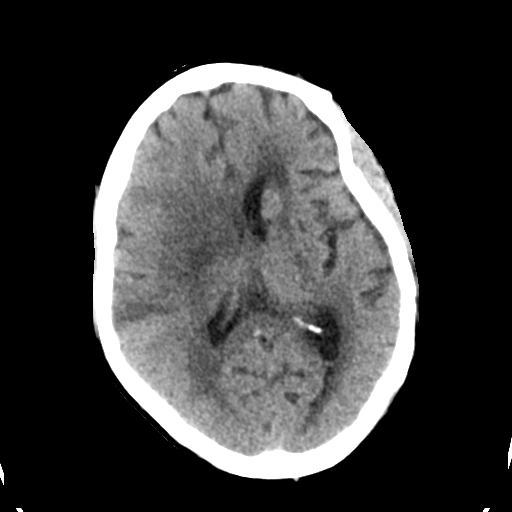
[im 17/32  bone]
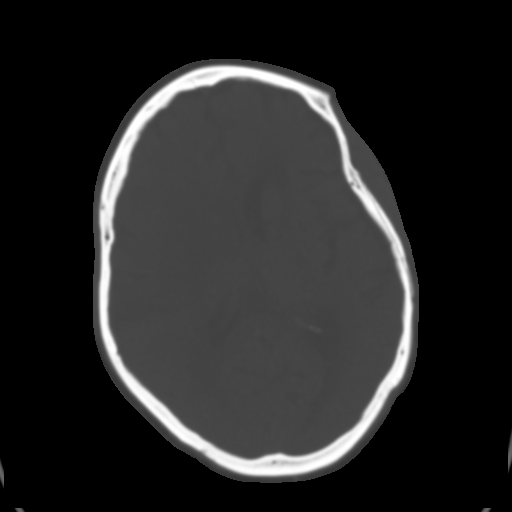
[im 20/32  brain]
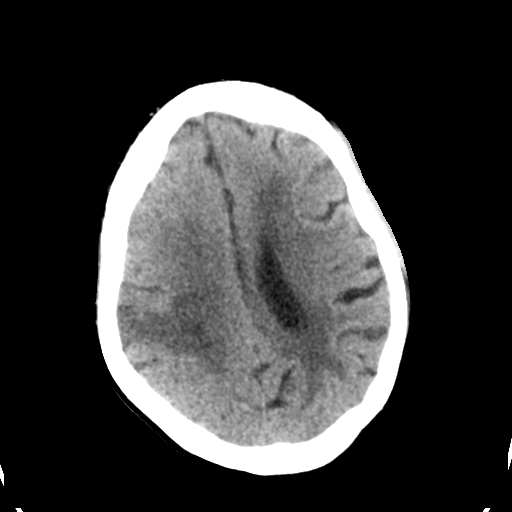
[im 23/32  brain]
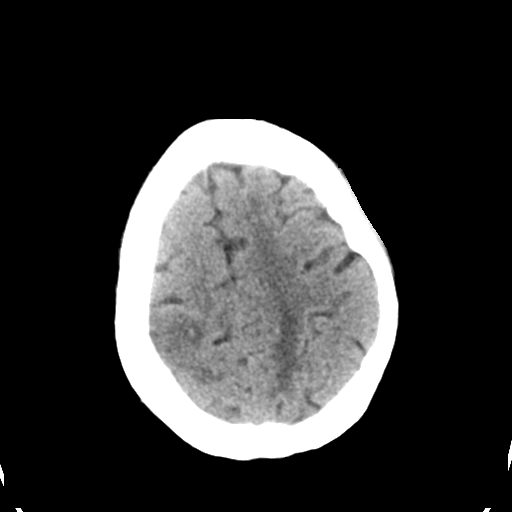
[im 26/32  brain]
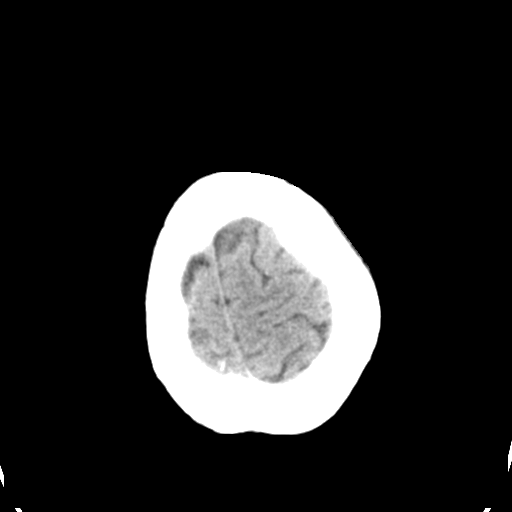
[im 29/32  brain]
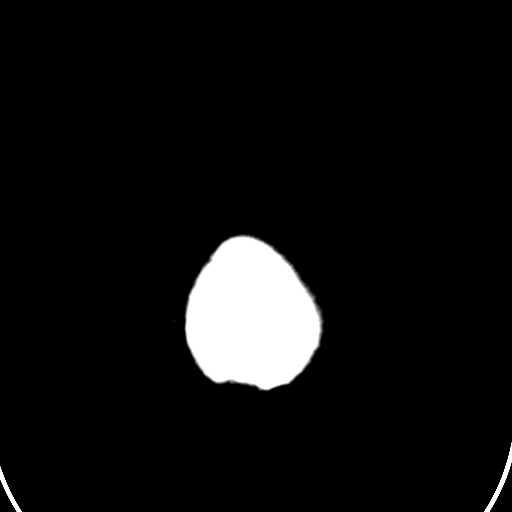
[im 29/32  bone]
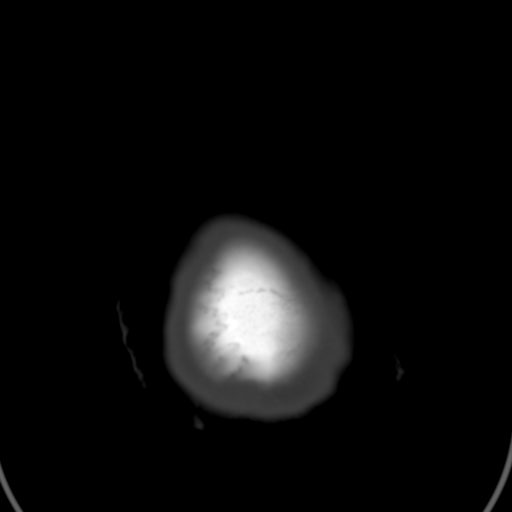

[Series 5: head 3.0 mpr cor · coronal · 0.30mm/px · 3 of 68 slices shown]
[im 23/68  brain]
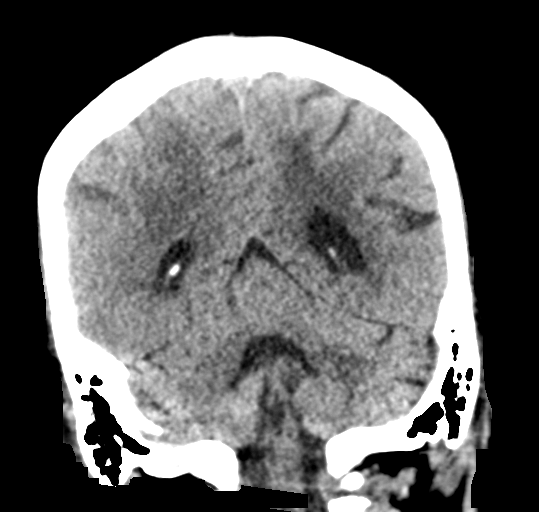
[im 30/68  brain]
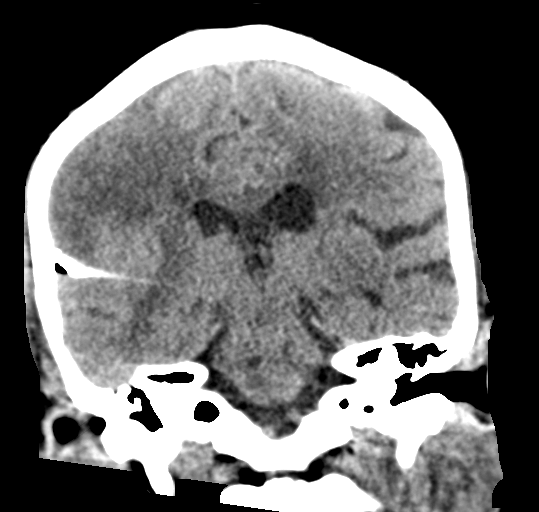
[im 38/68  brain]
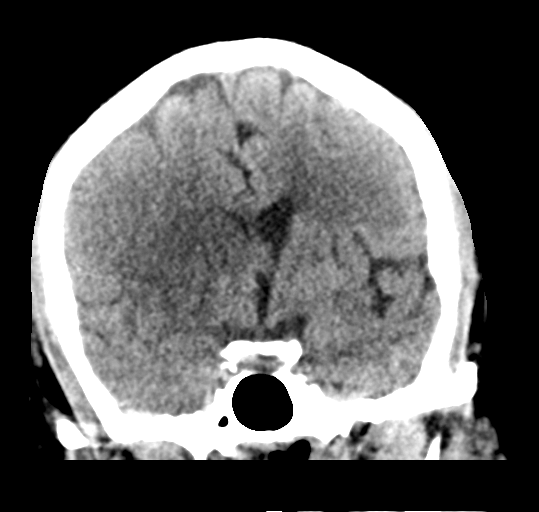

[Series 6: head 3.0 mpr sag · sagittal · 0.30mm/px · 3 of 54 slices shown]
[im 21/54  brain]
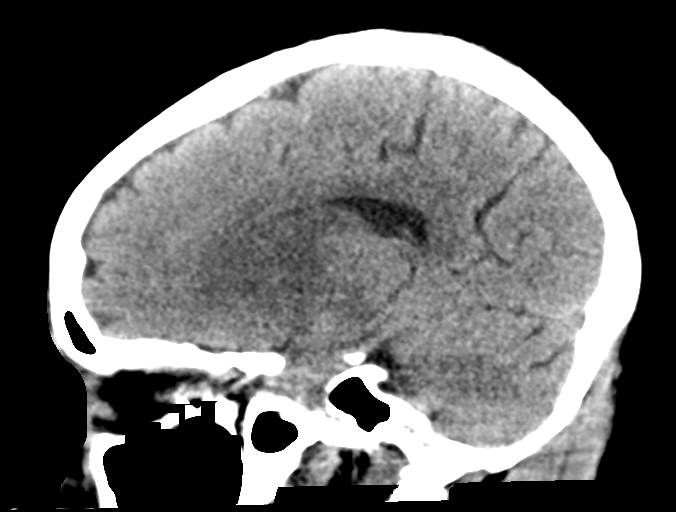
[im 27/54  brain]
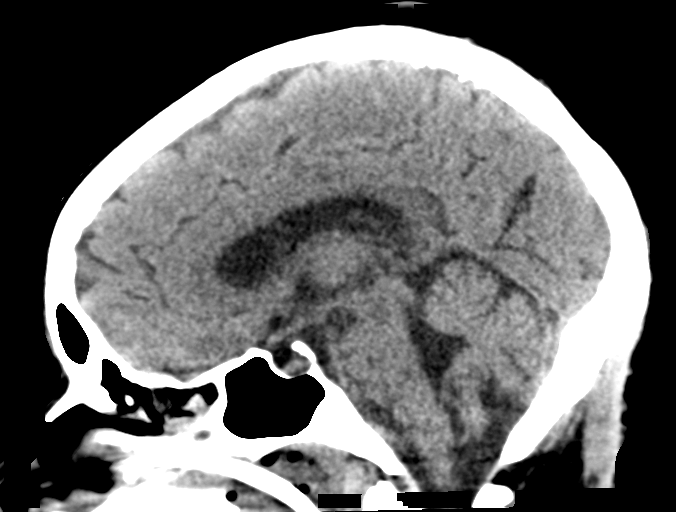
[im 34/54  brain]
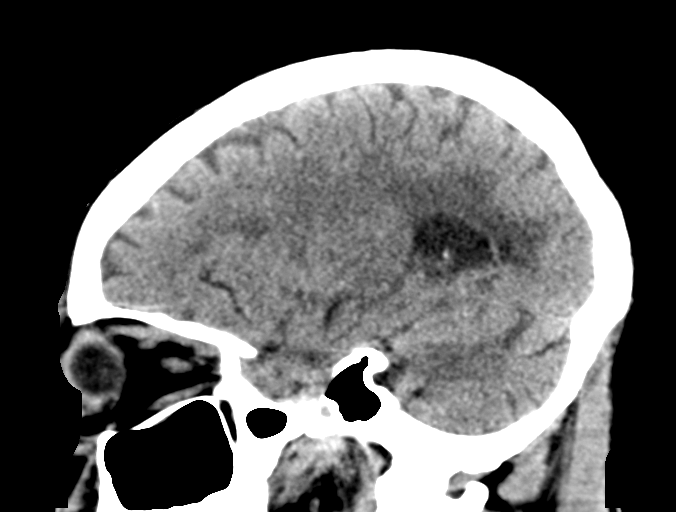

[15 of 47 positions shown; findings below may reference images not displayed]

FINDINGS: Brain: Right basal ganglia and frontal parietal cortex infarcts
which are in the MCA territory. Local mass effect without midline
shift. Confluent chronic small vessel ischemia. Remote left
cerebellar and brainstem infarcts. No acute hemorrhage.

Vascular: Right MCA branch embolization coil.

Skull: Normal. Negative for fracture or focal lesion.

Sinuses/Orbits: Negative
IMPRESSION: 1. Recent right MCA territory infarction without progressive finding
or interval hemorrhage.
2. Chronic ischemic injury elsewhere in the brain.

## 2021-05-19 MED ORDER — ISOSORBIDE DINITRATE 20 MG PO TABS
20.0000 mg | ORAL_TABLET | Freq: Three times a day (TID) | ORAL | Status: DC
  Administered 2021-05-19 – 2021-05-28 (×30): 20 mg
  Filled 2021-05-19 (×30): qty 1

## 2021-05-19 MED ORDER — ISOSORBIDE DINITRATE 20 MG PO TABS
20.0000 mg | ORAL_TABLET | Freq: Three times a day (TID) | ORAL | Status: DC
  Filled 2021-05-19: qty 1

## 2021-05-19 MED ORDER — AMLODIPINE BESYLATE 10 MG PO TABS
10.0000 mg | ORAL_TABLET | Freq: Every day | ORAL | Status: DC
  Administered 2021-05-19 – 2021-05-21 (×3): 10 mg
  Filled 2021-05-19 (×3): qty 1

## 2021-05-19 MED ORDER — BISACODYL 10 MG RE SUPP
10.0000 mg | Freq: Once | RECTAL | Status: DC
  Filled 2021-05-19: qty 1

## 2021-05-19 MED ORDER — CLONIDINE HCL 0.2 MG PO TABS
0.2000 mg | ORAL_TABLET | Freq: Three times a day (TID) | ORAL | Status: DC
  Administered 2021-05-19 – 2021-05-21 (×7): 0.2 mg
  Filled 2021-05-19 (×7): qty 1

## 2021-05-19 NOTE — Progress Notes (Signed)
Orthopedic Tech Progress Note Patient Details:  Holly Bradley [birth year] [id number] Called in order for splint  Patient ID: [name], female   DOB: 1976-03-01, 45 y.o.   MRN: [id number]   Chip Boer 05/19/2021, 1:49 PM

## 2021-05-19 NOTE — Progress Notes (Signed)
CRITICAL VALUE STICKER  CRITICAL VALUE: chloride >130  RECEIVER (on-site recipient of call): Delorise Jackson RN  DATE & TIME NOTIFIED: 05/19/21 @ 11  MESSENGER (representative from lab):  MD NOTIFIED: Rosalin Hawking, stroke  TIME OF NOTIFICATION: 1120  RESPONSE: no new orders

## 2021-05-19 NOTE — Progress Notes (Addendum)
PT Cancellation Note  Patient Details Name: Chene Kasinger MRN: [id number] DOB: 1976-05-31   Cancelled Treatment:    Reason Eval/Treat Not Completed: Patient not medically ready. RN reports they are attempting to wean her on the vent today. Will plan to follow-up another day as appropriate. Coordinated with RN to perform ROM with pt.    Moishe Spice, PT, DPT Acute Rehabilitation Services  Pager: [phone] Office: [place] 05/19/2021, 12:04 PM

## 2021-05-19 NOTE — Plan of Care (Signed)
  Problem: Clinical Measurements: Goal: Cardiovascular complication will be avoided Outcome: Progressing   Problem: Activity: Goal: Risk for activity intolerance will decrease Outcome: Progressing   Problem: Nutrition: Goal: Adequate nutrition will be maintained Outcome: Progressing   Problem: Coping: Goal: Level of anxiety will decrease Outcome: Progressing   Problem: Elimination: Goal: Will not experience complications related to urinary retention Outcome: Progressing   Problem: Pain Managment: Goal: General experience of comfort will improve Outcome: Progressing

## 2021-05-19 NOTE — Progress Notes (Signed)
STROKE TEAM PROGRESS NOTE   INTERVAL HISTORY Pt still intubated, on vent, eyes closed, barely open with voice, but able to follow simple commands on the right hand and foot. Still has left hemiplegia. On maximized dose of cleviprex. Off sedation and 3% saline now, Na 157. On TF. Plan for SBT trial today.    Vitals:   05/19/21 0740 05/19/21 0800 05/19/21 0900 05/19/21 0930  BP:  (!) 153/64 (!) 157/63 (!) 162/73  Pulse: 91 96 99 98  Resp: 18 19 (!) 9 13  Temp:  100.1 F (37.8 C)    TempSrc:  Axillary    SpO2: 99% 100% 100% 100%  Weight:      Height:       CBC:  Recent Labs  Lab 05/15/21 0430 05/15/21 1221 05/18/21 0127 05/18/21 0905  WBC 10.2  --  12.7* 14.0*  NEUTROABS 8.7*  --   --   --   HGB 8.0*   < > 5.9* 7.1*  HCT 27.4*   < > 21.2* 24.2*  MCV 70.8*  --  73.1* 73.3*  PLT 356  --  128* 194   < > = values in this interval not displayed.   Basic Metabolic Panel:  Recent Labs  Lab 05/17/21 0415 05/17/21 1023 05/17/21 1817 05/18/21 0127 05/18/21 0905 05/18/21 2233 05/19/21 0445  NA 150*   < > 158* 155*   < > 158* 157*  K 3.5  --  3.3* 3.9  --   --   --   CL 123*  --   --  129*  --   --   --   CO2 16*  --   --  18*  --   --   --   GLUCOSE 127*  --   --  115*  --   --   --   BUN 14  --   --  17  --   --   --   CREATININE 1.48*  --   --  1.64*  --   --   --   CALCIUM 8.6*  --   --  8.8*  --   --   --   MG 2.0  --   --   --   --   --   --   PHOS 3.3  --   --   --   --   --   --    < > = values in this interval not displayed.   Lipid Panel:  Recent Labs  Lab 05/16/21 0457 05/18/21 0127  CHOL 131  --   TRIG 178* 77  HDL 19*  --   CHOLHDL 6.9  --   VLDL 36  --   LDLCALC 76  --    HgbA1c:  Recent Labs  Lab 05/16/21 0457  HGBA1C 5.4   Urine Drug Screen:  Recent Labs  Lab 05/15/21 0445  LABOPIA NONE DETECTED  COCAINSCRNUR NONE DETECTED  LABBENZ NONE DETECTED  AMPHETMU NONE DETECTED  THCU NONE DETECTED  LABBARB NONE DETECTED    Alcohol Level   Recent Labs  Lab 05/15/21 0438  ETH <10    IMAGING  CT HEAD WO CONTRAST Result Date: 05/16/2021  IMPRESSION:  1. Continued interval evolution of moderately large right MCA distribution infarct. Associated regional mass effect has mildly increased, with new 2 mm of right-to-left midline shift. No hydrocephalus or trapping.  2. Decreased prominence of subarachnoid hemorrhage at the posterior right Sylvian fissure and overlying  right frontoparietal region. Trace intraventricular hemorrhage related to redistribution.  3. Question new area of parenchymal hypodensity involving the lateral left cerebellum. While this finding is favored to be artifactual, a possible new acute ischemic infarct is difficult to exclude, and could be considered in the correct clinical setting. Attention at follow-up recommended.  4. No other new acute intracranial abnormality.   CT HEAD WO CONTRAST Result Date: 05/15/2021  IMPRESSION: Small-to-moderate volume hyperdensity within the right sylvian fissure and along portions of the right frontoparietal and temporal lobes compatible with extravasated contrast/subarachnoid hemorrhage. There is hyperdensity within the right basal ganglia, right insula, lateral right frontal lobe/frontal operculum and within portions of the right parietal lobe likely reflecting contrast staining at sites of acute infarction. Mild associated mass effect with subtle partial effacement of the right lateral ventricle. No midline shift or hydrocephalus. Embolization material now overlies the lateral right parietal lobe. Stable background parenchymal atrophy and chronic small vessel ischemic disease. Redemonstrated chronic infarct within the superior left cerebellum.   MR BRAIN WO CONTRAST Result Date: 05/15/2021 IMPRESSION:  1. Moderately large acute right MCA infarct. Associated petechial hemorrhage, subarachnoid hemorrhage, and minimal intraventricular hemorrhage.  2. Severe chronic small vessel  ischemic disease with multiple chronic infarcts.   MR CERVICAL SPINE WO CONTRAST Result Date: 05/15/2021  IMPRESSION: The cord itself appears normal. No disc disease. No spinal stenosis. 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 No significant encroachment upon the neural spaces. Some prevertebral soft tissue edema. In this clinical scenario, this may be subsequent to previous endotracheal intubation. One could not rule out pharyngitis or cellulitis but that seems less likely. I do not see any traumatic injury to the cervical spine itself. Electronically Signed   By: Nelson Chimes M.D.   On: 05/15/2021 16:05   IR CT Head Ltd Result Date: 05/15/2021 CT of the head was obtained and post processed in a separate workstation with concurrent attending physician supervision. Selected images were sent to PACS. Small right sylvian contrast extravasation confirmed. Additionally, hyperdensity of the right basal ganglia and lateral right frontal cortex are noted, related to ongoing ischemia. Right internal carotid artery angiograms with frontal and lateral views of the head showed occlusion of the embolized branch with no evidence of contrast extravasation. Patency of the remaining MCA branches noted.  IR PERCUTANEOUS ART THROMBECTOMY/INFUSION INTRACRANIAL INC DIAG ANGIO Result Date: 05/15/2021 IMPRESSION: Mechanical thrombectomy performed for treatment of right M1/MCA occlusion with clot fragmentation and embolization to same distal territory requiring multiple additional passes. Procedure complicated by contrast extravasation at a distal right M3/MCA branch requiring vessel embolization. Near complete recanalization of the right MCA vascular tree was achieved (TICI 2b). PLAN: - Bed rest post femoral access x6h - Follow-up CT in 4 hours to evaluate for SAH/contrast extravasation stability - Transfer to ICU- SBP: 120-140 mmHg   Echo Result read: 05/16/2021 1. Left ventricular ejection fraction, by  estimation, is 55 to 60%. The  left ventricle has normal function. The left ventricle has no regional  wall motion abnormalities. There is moderate concentric left ventricular  hypertrophy. Left ventricular  diastolic parameters are consistent with Grade II diastolic dysfunction  (pseudonormalization). Elevated left atrial pressure.  2. Right ventricular systolic function is normal. The right ventricular  size is normal. Mildly increased right ventricular wall thickness. There  is moderately elevated pulmonary artery systolic pressure. The estimated  right ventricular systolic pressure  is 16.6 mmHg.  3. Left atrial size was severely dilated.  4. Right atrial  size was severely dilated.  5. There is no evidence of cardiac tamponade.  6. The mitral valve is normal in structure. Trivial mitral valve  regurgitation. No evidence of mitral stenosis.  7. The aortic valve is normal in structure. There is mild calcification  of the aortic valve. There is mild thickening of the aortic valve. Aortic  valve regurgitation is not visualized. Mild aortic valve sclerosis is  present, with no evidence of aortic  valve stenosis.  8. The inferior vena cava is dilated in size with <50% respiratory  variability, suggesting right atrial pressure of 15 mmHg.   PHYSICAL EXAM  Temp:  [98.8 F (37.1 C)-100.9 F (38.3 C)] 100.1 F (37.8 C) (05/28 0800) Pulse Rate:  [84-100] 98 (05/28 0930) Resp:  [1-22] 13 (05/28 0930) BP: (104-177)/(50-82) 162/73 (05/28 0930) SpO2:  [96 %-100 %] 100 % (05/28 0930) FiO2 (%):  [30 %-40 %] 30 % (05/28 0715)  General - Well nourished, well developed, intubated off sedation.  Ophthalmologic - fundi not visualized due to noncooperation.  Cardiovascular - Regular rhythm but tachycardia.  Neuro - intubated off sedation, eyes closed, barely open with voice stimulation but able to follow simple commands on the right hand and foot. With forced eye opening, eyes in right  gaze position, not blinking to visual threat on the left, not tracking, PERRL. B/l eyelid mildly swallow. Corneal reflex present, gag and cough present. Breathing  over the vent.  Facial symmetry not able to test due to ET tube.  Tongue protrusion not cooperative. On pain stimulation, left UE and LE flaccid. Right UE no drift and RLE 3-/5 with spontaneous movement in bed. No babinski. Sensation, coordination and gait not tested.   ASSESSMENT/PLAN [name] is a 45 y.o. female with history of uncontrolled HTN, tobacco use disorder, large uterine fibroids, heart failure, paroxysmal SVT, paranoid psychosis who presents with altered mental status after being found down.  CT head initially read as small acute appearing perforator infarct in the R BG and notable for severe small vessel ischemia. However concerning for potential R MCA stroke and thus CTA head and neck and CTP was obtained with a R MCA M1 occlusion and a core of 50cc and 72ml of penumbra.   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   CTA neck: LVO right M1 origin. Left vertebral occlusion at its origin. High-grade narrowings of the right cavernous ICA.  CT perfusion suggestive 50 cc core infarct and 50 cc of adjacent penumbra.  MRI: large acute right MCA infarct. Associated petechial hemorrhage, subarachnoid hemorrhage, and minimal intraventricular hemorrhage.  CT 5/28 stable right MCA infarct with MLS  2D Echo 55 to 60%  Further cardioembolic work up once pt condition stabilized   UDS: negative  LDL 76  HgbA1c 5.4  Sickle cell screen - neg, ANA pending  VTE prophylaxis - SCDs  No antithrombotic prior to admission, no antithrombotics now due to [facility].   Therapy recommendations:  CIR  Disposition:  Pending  Cytotoxic  cerebral edema  Increase mass effect with new 70mm of right to left midline shift  On 3% saline @ 30  Na 157->156  Na goal 150-160  Check sodium Q6  Respiratory failure  Intubated 5/26 for increased work of breathing with altered mental status   Management per CCM, appreciated  Acute blood loss anemia  hgb 7.8->5.9->PRBC->7.6  One unit PRBC tranfusion per CCM  No clear source of bleeding  Monitor trend   Dysphagia  Likely due to stroke  SLP following  cortrak placement  Nutrition consult for tube feed recs  Large Abdomino/pelvic mass  Extremely large abdominopelvic mass measuring up to 22.9 cm noted on CT 5/26  Abdominal distention on exam   Follow up with OBGYN  Paroxysmal SVT history  Restart amiodarone 200mg  daily  Not sure if this is related to current embolic stroke  May consider 30 day monitoring vs. Loop recorder  Hypertension  Home meds:  Hydralazine 100mg  TID, Imdur 60mg  daily, Toprol XL 200mg  daily  Off Cleviprex  Add Amlodipine 10mg  daily  Resume home medications  SBP goal to <160 mmHg  Labetalol prn . Long-term BP goal normotensive  Hyperlipidemia  Home meds:  none  LDL 76, goal < 70  Add atorvastatin 20mg  daily  Continue statin at discharge  Tobacco abuse  Current smoker  Smoking cessation counseling will be provided  Other Stroke Risk Factors  Coronary artery disease  Other Active Problems  AKI: creatinine 1.27->1.48, NS 560ml bolus given, monitor uop, check labs in am  Leukocytosis, WBC 12.7->14.0->23.1   05/19/2021 9:50 AM  This patient is critically ill due to right MCA stroke, hemorrhagic conversion, SAH, leukocytosis, acute anemia, AKI, respiratory failure and at significant risk of neurological worsening, death form recurrent stroke, hemorrhagic conversion, renal failure, respiratory failure. This patient's care requires constant monitoring of vital signs, hemodynamics, respiratory and cardiac  monitoring, review of multiple databases, neurological assessment, discussion with family, other specialists and medical decision making of high complexity. I spent 40 minutes of neurocritical care time in the care of this patient.  Rosalin Hawking, MD PhD Stroke Neurology 05/19/2021 11:35 PM     To contact Stroke Continuity provider, please refer to [email or web address] After hours, contact General Neurology

## 2021-05-19 NOTE — Progress Notes (Signed)
Ventilator pt transported from 4N15 to CT3 and back without any complications.

## 2021-05-19 NOTE — Progress Notes (Signed)
NAME:  Holly Bradley, MRN:  [id number], DOB:  Jan 25, 1976, LOS: 4  ADMISSION DATE:  05/15/2021, CONSULTATION DATE:  05/19/21 REFERRING MD:  Leonie Man, CHIEF COMPLAINT:  Respiratory failure   History of Present Illness:  Holly Bradley is a [name] with PMH of HTN, tobacco use, CHF, DMT2 (controlled with diet and exercise), pSVT, uterine fibroids, paranoid psychosis who presented with AMS on 5/24.   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  On follow up imaging, she had stable appearing SAH and continued R basal ganglia infarct with 83mm R to L shift.   She was admitted to the Neuro ICU and treating with hypertonic saline, statin and BP control.  On 5/26, PCCM consulted when pt became acutely less responsive with respiratory distress along with abdominal distension worse than baseline, per patient.  She was intubated and repeat head CT, abd/pelvis CT ordered.  Repeat head CT on 5/26 showed increased cytotoxic edema and mildly increased leftward midline shift, now 5 mm, decreased SAH and IVH.  Abdominal CT showing 22.9 cm mass, possibly consistent with uterine fibroids, no lymphadenopathy.      Pertinent  Medical History  -CHF -tobacco use -DMT2 -HTN -pSVT -uterine fibroids -paranoid psychosis   Significant Hospital Events: Including procedures, antibiotic start and stop dates in addition to other pertinent events   . 5/24 Admit to neurology after ED visit with AMS, found to have R MCA stroke, underwent mechanical thrombectomy, M3 coil embolization, subsequent SAH . 5/26 decreased mental status and respiratory distress, intubated   Interim History / Subjective:  Overnight on and off cleviprex. More purposeful this morning and more awake Having vaginal bleeding  Objective   Blood pressure (!) 153/64, pulse  96, temperature 100.1 F (37.8 C), temperature source Axillary, resp. rate 19, height 5\' 5"  (1.651 m), weight 76.3 kg, SpO2 100 %.    Vent Mode: PRVC FiO2 (%):  [30 %-40 %] 30 % Set Rate:  [20 bmp] 20 bmp Vt Set:  [450 mL] 450 mL PEEP:  [5 cmH20] 5 cmH20 Plateau Pressure:  [14 cmH20-20 cmH20] 16 cmH20   Intake/Output Summary (Last 24 hours) at 05/19/2021 0846 Last data filed at 05/19/2021 0700 Gross per 24 hour  Intake 1987.2 ml  Output 1350 ml  Net 637.2 ml   Filed Weights   05/15/21 0515 05/18/21 0359  Weight: 72.6 kg 76.3 kg    General:  Somnolent female, lying in bed, intubated  HEENT: MM pink/moist, ETT in place, prominent periorbital edema Neuro: purposeful on the right side, not moving the left.  CV: tachycardic, regular PULM:  CTAB, normal WOB, on full vent support  GI: soft, distended with large, firm mass Extremities: warm/dry, trace LE edema  Skin: no rashes or lesions  Labs/imaging that I havepersonally reviewed  (right click and "Reselect all SmartList Selections" daily)  Na 157 (from today) WBC 14, Hgb 7.1 (from 5/27. Repeat labs from this morning sent and pending)  Assessment & Plan:   Acute Respiratory Failure P: -Maintain full vent support with SAT/SBT as tolerated. Will plan for SBT today -Titrate vent setting to maintain SpO2 greater than or equal to 90%. -HOB elevated 30 degrees. -Plateau pressures less than 30 cm H20.  -Bronchial hygiene and RT/bronchodilator protocol.  Acute metabolic encephalopathy acute large R MCA infarct with R sylvan fissure SAH and midline shift  -  Goal Na150-155, she is now off HTS - Continue with SBP goal of <160, per neurology. Will increase amlodipine and add isordil (on imdur at home.) still on and off cleviprex for BP goal.   Fever low grade (100F)  In setting of acute respiratory distress requiring intubation, new leukocytosis -Will send tracheal aspirate  Uterine fibroids: Acute blood loss anemia P:   -Continue to monitor with abdominal exams - has ongoing vaginal bleeding, monitor closely - as outpatient will need surgery  - transfuse goal hgb>7  Best practice (right click and "Reselect all SmartList Selections" daily)  Diet:  NPO, tube feeds Pain/Anxiety/Delirium protocol (if indicated): Yes (RASS goal -1) VAP protocol (if indicated): Yes DVT prophylaxis: SCD GI prophylaxis: PPI Glucose control:  SSI Yes Central venous access:  N/A Arterial line:  N/A Foley:  N/A Mobility:  bed rest  PT consulted: Yes Last date of multidisciplinary goals of care discussion [per primary. Brother has been calling in for updates, will touch base today at the bedside. May need to discuss trach and peg] Code Status:  full code Disposition: ICU  Labs   CBC: Recent Labs  Lab 05/15/21 0430 05/15/21 1221 05/17/21 1817 05/18/21 0127 05/18/21 0905  WBC 10.2  --   --  12.7* 14.0*  NEUTROABS 8.7*  --   --   --   --   HGB 8.0* 7.8* 7.8* 5.9* 7.1*  HCT 27.4* 26.5* 23.0* 21.2* 24.2*  MCV 70.8*  --   --  73.1* 73.3*  PLT 356  --   --  128* 734    Basic Metabolic Panel: Recent Labs  Lab 05/15/21 0430 05/16/21 1056 05/17/21 0415 05/17/21 1023 05/17/21 1817 05/18/21 0127 05/18/21 0905 05/18/21 1520 05/18/21 2233 05/19/21 0445  NA 140   < > 150*   < > 158* 155* 157* 157* 158* 157*  K 3.3*  --  3.5  --  3.3* 3.9  --   --   --   --   CL 108  --  123*  --   --  129*  --   --   --   --   CO2 21*  --  16*  --   --  18*  --   --   --   --   GLUCOSE 114*  --  127*  --   --  115*  --   --   --   --   BUN 17  --  14  --   --  17  --   --   --   --   CREATININE 1.27*  --  1.48*  --   --  1.64*  --   --   --   --   CALCIUM 9.3  --  8.6*  --   --  8.8*  --   --   --   --   MG  --   --  2.0  --   --   --   --   --   --   --   PHOS  --   --  3.3  --   --   --   --   --   --   --    < > = values in this interval not displayed.   GFR: Estimated Creatinine Clearance: 43.3 mL/min (A) (by C-G formula  based on SCr of 1.64 mg/dL (H)). Recent Labs  Lab 05/15/21 0430 05/18/21 0127 05/18/21 0905  WBC  10.2 12.7* 14.0*    Liver Function Tests: Recent Labs  Lab 05/15/21 0430  AST 26  ALT 19  ALKPHOS 88  BILITOT 1.0  PROT 7.6  ALBUMIN 3.5    Recent Labs  Lab 05/15/21 0438  AMMONIA <9*    ABG    Component Value Date/Time   PHART 7.333 (L) 05/17/2021 1817   PCO2ART 33.7 05/17/2021 1817   PO2ART 156 (H) 05/17/2021 1817   HCO3 17.6 (L) 05/17/2021 1817   TCO2 19 (L) 05/17/2021 1817   ACIDBASEDEF 7.0 (H) 05/17/2021 1817   O2SAT 99.0 05/17/2021 1817     HbA1C: Hgb A1c MFr Bld  Date/Time Value Ref Range Status  05/16/2021 04:57 AM 5.4 4.8 - 5.6 % Final    Comment:    (NOTE)         Prediabetes: 5.7 - 6.4         Diabetes: >6.4         Glycemic control for adults with diabetes: <7.0     CBG: Recent Labs  Lab 05/18/21 1508 05/18/21 1916 05/18/21 2320 05/19/21 0407 05/19/21 0750  GLUCAP 125* 116* 132* 124* 151*    Critical care time: 33 minutes    The patient is critically ill due to respiratory failure, hypertensive crisis,  encephalopathy, stroke, subarachnoid hemorrhage, anemia.  Critical care was necessary to treat or prevent imminent or life-threatening deterioration.  Critical care was time spent personally by me on the following activities: development of treatment plan with patient and/or surrogate as well as nursing, discussions with consultants, evaluation of patient's response to treatment, examination of patient, obtaining history from patient or surrogate, ordering and performing treatments and interventions, ordering and review of laboratory studies, ordering and review of radiographic studies, pulse oximetry, re-evaluation of patient's condition and participation in multidisciplinary rounds.   Critical Care Time devoted to patient care services described in this note is 33 minutes. This time reflects time of care of this [place] . This  critical care time does not reflect separately billable procedures or procedure time, teaching time or supervisory time of PA/NP/Med student/Med Resident etc but could involve care discussion time.       Spero Geralds [facility] 05/19/2021 8:46 AM  Pager: see AMION  If no response to pager , please call critical care on call (see AMION) until 7pm After 7:00 pm call Elink

## 2021-05-20 ENCOUNTER — Inpatient Hospital Stay (HOSPITAL_COMMUNITY): Payer: Medicaid Other

## 2021-05-20 ENCOUNTER — Other Ambulatory Visit: Payer: Self-pay

## 2021-05-20 DIAGNOSIS — Z01818 Encounter for other preprocedural examination: Secondary | ICD-10-CM

## 2021-05-20 DIAGNOSIS — D72829 Elevated white blood cell count, unspecified: Secondary | ICD-10-CM

## 2021-05-20 DIAGNOSIS — I609 Nontraumatic subarachnoid hemorrhage, unspecified: Secondary | ICD-10-CM

## 2021-05-20 DIAGNOSIS — D649 Anemia, unspecified: Secondary | ICD-10-CM

## 2021-05-20 DIAGNOSIS — G9341 Metabolic encephalopathy: Secondary | ICD-10-CM | POA: Diagnosis not present

## 2021-05-20 DIAGNOSIS — E870 Hyperosmolality and hypernatremia: Secondary | ICD-10-CM | POA: Diagnosis not present

## 2021-05-20 DIAGNOSIS — J9601 Acute respiratory failure with hypoxia: Secondary | ICD-10-CM | POA: Diagnosis not present

## 2021-05-20 DIAGNOSIS — I639 Cerebral infarction, unspecified: Secondary | ICD-10-CM | POA: Diagnosis not present

## 2021-05-20 DIAGNOSIS — I63511 Cerebral infarction due to unspecified occlusion or stenosis of right middle cerebral artery: Secondary | ICD-10-CM | POA: Diagnosis not present

## 2021-05-20 LAB — BASIC METABOLIC PANEL
BUN: 25 mg/dL — ABNORMAL HIGH (ref 6–20)
CO2: 21 mmol/L — ABNORMAL LOW (ref 22–32)
Calcium: 9 mg/dL (ref 8.9–10.3)
Chloride: >130 mmol/L (ref 98–111)
Creatinine, Ser: 1.51 mg/dL — ABNORMAL HIGH (ref 0.44–1.00)
GFR, Estimated: 43 mL/min — ABNORMAL LOW (ref >60)
Glucose, Bld: 117 mg/dL — ABNORMAL HIGH (ref 70–99)
Potassium: 4.5 mmol/L (ref 3.5–5.1)
Sodium: 156 mmol/L — ABNORMAL HIGH (ref 135–145)

## 2021-05-20 LAB — GLUCOSE, CAPILLARY
Glucose-Capillary: 113 mg/dL — ABNORMAL HIGH (ref 70–99)
Glucose-Capillary: 117 mg/dL — ABNORMAL HIGH (ref 70–99)
Glucose-Capillary: 118 mg/dL — ABNORMAL HIGH (ref 70–99)
Glucose-Capillary: 129 mg/dL — ABNORMAL HIGH (ref 70–99)
Glucose-Capillary: 116 mg/dL — ABNORMAL HIGH (ref 70–99)
Glucose-Capillary: 120 mg/dL — ABNORMAL HIGH (ref 70–99)

## 2021-05-20 LAB — SODIUM
Sodium: 157 mmol/L — ABNORMAL HIGH (ref 135–145)
Sodium: 156 mmol/L — ABNORMAL HIGH (ref 135–145)
Sodium: 156 mmol/L — ABNORMAL HIGH (ref 135–145)

## 2021-05-20 LAB — CBC
HCT: 23.1 % — ABNORMAL LOW (ref 36.0–46.0)
Hemoglobin: 6.5 g/dL — CL (ref 12.0–15.0)
MCH: 20.8 pg — ABNORMAL LOW (ref 26.0–34.0)
MCHC: 28.1 g/dL — ABNORMAL LOW (ref 30.0–36.0)
MCV: 74 fL — ABNORMAL LOW (ref 80.0–100.0)
Platelets: 233 10*3/uL (ref 150–400)
RBC: 3.12 MIL/uL — ABNORMAL LOW (ref 3.87–5.11)
RDW: 19.7 % — ABNORMAL HIGH (ref 11.5–15.5)
WBC: 20.9 10*3/uL — ABNORMAL HIGH (ref 4.0–10.5)
nRBC: 0.1 % (ref 0.0–0.2)

## 2021-05-20 LAB — HEMOGLOBIN AND HEMATOCRIT, BLOOD
HCT: 24.3 % — ABNORMAL LOW (ref 36.0–46.0)
Hemoglobin: 7 g/dL — ABNORMAL LOW (ref 12.0–15.0)

## 2021-05-20 LAB — PREPARE RBC (CROSSMATCH)

## 2021-05-20 MED ORDER — SODIUM CHLORIDE 0.9% IV SOLUTION: Freq: Once | INTRAVENOUS | Status: DC

## 2021-05-20 MED ORDER — DEXMEDETOMIDINE HCL IN NACL 400 MCG/100ML IV SOLN
0.4000 ug/kg/h | INTRAVENOUS | Status: DC
  Administered 2021-05-20 – 2021-05-21 (×3): 0.4 ug/kg/h via INTRAVENOUS
  Filled 2021-05-20 (×3): qty 100

## 2021-05-20 MED ORDER — MEGESTROL ACETATE 40 MG PO TABS
80.0000 mg | ORAL_TABLET | Freq: Two times a day (BID) | ORAL | Status: DC
  Administered 2021-05-20 – 2021-05-23 (×7): 80 mg
  Filled 2021-05-20 (×7): qty 2

## 2021-05-20 MED ORDER — MEGESTROL ACETATE 40 MG PO TABS
80.0000 mg | ORAL_TABLET | Freq: Two times a day (BID) | ORAL | Status: DC
  Filled 2021-05-20: qty 2

## 2021-05-20 NOTE — Progress Notes (Signed)
Date and time results received: 05/20/21 5:41 AM    Test: Lab Critical Value: Cl >130  Name of Provider Notified: Dr. Lucile Shutters  Orders Received? Or Actions Taken?:

## 2021-05-20 NOTE — Progress Notes (Signed)
eLink Physician-Brief Progress Note [name] DOB: [id number] MRN: [id number]   Date of Service  05/20/2021  HPI/Events of Note  Hemoglobin 6.5 mg, patient has had mild vaginal bleeding from known uterine fibroids, no other source of bleeding.  eICU Interventions  Transfuse one unit PRBC.        Kerry Kass Dereke Neumann 05/20/2021, 5:04 AM

## 2021-05-20 NOTE — Progress Notes (Signed)
NAME:  Khandi Kernes, MRN:  [id number], DOB:  03-Dec-1976, LOS: 5  ADMISSION DATE:  05/15/2021, CONSULTATION DATE:  05/20/21 REFERRING MD:  Leonie Man, CHIEF COMPLAINT:  Respiratory failure   History of Present Illness:  Ms. Gohr is a [name] with PMH of HTN, tobacco use, CHF, DMT2 (controlled with diet and exercise), pSVT, uterine fibroids, paranoid psychosis who presented with AMS on 5/24.   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  On follow up imaging, she had stable appearing SAH and continued R basal ganglia infarct with 55mm R to L shift.   She was admitted to the Neuro ICU and treating with hypertonic saline, statin and BP control.  On 5/26, PCCM consulted when pt became acutely less responsive with respiratory distress along with abdominal distension worse than baseline, per patient.  She was intubated and repeat head CT, abd/pelvis CT ordered.  Repeat head CT on 5/26 showed increased cytotoxic edema and mildly increased leftward midline shift, now 5 mm, decreased SAH and IVH.  Abdominal CT showing 22.9 cm mass, possibly consistent with uterine fibroids, no lymphadenopathy.      Pertinent  Medical History  -CHF -tobacco use -DMT2 -HTN -pSVT -uterine fibroids -paranoid psychosis   Significant Hospital Events: Including procedures, antibiotic start and stop dates in addition to other pertinent events   . 5/24 Admit to neurology after ED visit with AMS, found to have R MCA stroke, underwent mechanical thrombectomy, M3 coil embolization, subsequent SAH . 5/26 decreased mental status and respiratory distress, intubated   Interim History / Subjective:  Having vaginal bleeding and anemia. Off cleviprex. Anemia this morning  Objective   Blood pressure (!) 162/68, pulse 86, temperature 99.8 F (37.7  C), temperature source Axillary, resp. rate 17, height 5\' 5"  (1.651 m), weight 75.5 kg, SpO2 100 %.    Vent Mode: PRVC FiO2 (%):  [30 %-40 %] 40 % Set Rate:  [20 bmp] 20 bmp Vt Set:  [450 mL] 450 mL PEEP:  [5 cmH20] 5 cmH20 Pressure Support:  [0 cmH20] 0 cmH20 Plateau Pressure:  [17 cmH20-25 cmH20] 25 cmH20   Intake/Output Summary (Last 24 hours) at 05/20/2021 1049 Last data filed at 05/20/2021 0700 Gross per 24 hour  Intake 765.74 ml  Output 1100 ml  Net -334.26 ml   Filed Weights   05/15/21 0515 05/18/21 0359 05/20/21 0400  Weight: 72.6 kg 76.3 kg 75.5 kg    General:  Resting comfortably, sedated, intubated HEENT: ETT to vent Neuro: moves the right side, follows commands per nursing CV: tachycardic, regular PULM:  CTAB, normal WOB, on full vent support  GI: soft with palpable uterine fibroid Extremities: no edema  Labs/imaging that I havepersonally reviewed  (right click and "Reselect all SmartList Selections" daily)  Na 157 this am Hgb 6.5  Assessment & Plan:   Acute Respiratory Failure P: -Maintain full vent support with SAT/SBT as tolerated. Will plan for SBT today -Titrate vent setting to maintain SpO2 greater than or equal to 90%. -HOB elevated 30 degrees. -Plateau pressures less than 30 cm H20.  -Bronchial hygiene and RT/bronchodilator protocol.  Acute metabolic encephalopathy acute large R MCA infarct with R sylvan fissure SAH and midline shift  - Goal Na150-155, - Continue with SBP goal of <160, per neurology. off cleviprex now - will stop fentanyl and transition to precedex to facilitation  ventilator weaning  Fever low grade (100F)  In setting of acute respiratory distress requiring intubation, new leukocytosis -tracheal aspirate pending  Uterine fibroids: Acute blood loss anemia P:  -Continue to monitor with abdominal exams - has ongoing vaginal bleeding, monitor closely - as outpatient will need surgery  - transfuse goal hgb>7. Getting 1 unit  prbc today  Best practice (right click and "Reselect all SmartList Selections" daily)  Diet:  NPO, tube feeds Pain/Anxiety/Delirium protocol (if indicated): Yes (RASS goal -1) VAP protocol (if indicated): Yes DVT prophylaxis: SCD GI prophylaxis: PPI Glucose control:  SSI Yes Central venous access:  N/A Arterial line:  N/A Foley:  N/A Mobility:  bed rest  PT consulted: Yes Last date of multidisciplinary goals of care discussion [per primary.] Code Status:  full code Disposition: ICU  Labs   CBC: Recent Labs  Lab 05/15/21 0430 05/15/21 1221 05/17/21 1817 05/18/21 0127 05/18/21 0905 05/19/21 1033 05/20/21 0356  WBC 10.2  --   --  12.7* 14.0* 23.1* 20.9*  NEUTROABS 8.7*  --   --   --   --   --   --   HGB 8.0*   < > 7.8* 5.9* 7.1* 7.6* 6.5*  HCT 27.4*   < > 23.0* 21.2* 24.2* 26.4* 23.1*  MCV 70.8*  --   --  73.1* 73.3* 73.5* 74.0*  PLT 356  --   --  128* 194 243 233   < > = values in this interval not displayed.    Basic Metabolic Panel: Recent Labs  Lab 05/15/21 0430 05/16/21 1056 05/17/21 0415 05/17/21 1023 05/17/21 1817 05/18/21 0127 05/18/21 0905 05/19/21 1033 05/19/21 1601 05/19/21 2250 05/20/21 0356 05/20/21 1001  NA 140   < > 150*   < > 158* 155*   < > 156* 157* 158* 156* 157*  K 3.3*  --  3.5  --  3.3* 3.9  --  4.3  --   --  4.5  --   CL 108  --  123*  --   --  129*  --  >130*  --   --  >130*  --   CO2 21*  --  16*  --   --  18*  --  18*  --   --  21*  --   GLUCOSE 114*  --  127*  --   --  115*  --  128*  --   --  117*  --   BUN 17  --  14  --   --  17  --  20  --   --  25*  --   CREATININE 1.27*  --  1.48*  --   --  1.64*  --  1.54*  --   --  1.51*  --   CALCIUM 9.3  --  8.6*  --   --  8.8*  --  9.3  --   --  9.0  --   MG  --   --  2.0  --   --   --   --   --   --   --   --   --   PHOS  --   --  3.3  --   --   --   --   --   --   --   --   --    < > = values in this interval not displayed.   GFR: Estimated Creatinine Clearance: 46.8 mL/min (A) (by  C-G formula based on SCr of 1.51 mg/dL (H)). Recent Labs  Lab 05/18/21 0127 05/18/21 0905 05/19/21 1033 05/20/21 0356  WBC 12.7* 14.0* 23.1* 20.9*    Liver Function Tests: Recent Labs  Lab 05/15/21 0430  AST 26  ALT 19  ALKPHOS 88  BILITOT 1.0  PROT 7.6  ALBUMIN 3.5    Recent Labs  Lab 05/15/21 0438  AMMONIA <9*    ABG    Component Value Date/Time   PHART 7.333 (L) 05/17/2021 1817   PCO2ART 33.7 05/17/2021 1817   PO2ART 156 (H) 05/17/2021 1817   HCO3 17.6 (L) 05/17/2021 1817   TCO2 19 (L) 05/17/2021 1817   ACIDBASEDEF 7.0 (H) 05/17/2021 1817   O2SAT 99.0 05/17/2021 1817     HbA1C: Hgb A1c MFr Bld  Date/Time Value Ref Range Status  05/16/2021 04:57 AM 5.4 4.8 - 5.6 % Final    Comment:    (NOTE)         Prediabetes: 5.7 - 6.4         Diabetes: >6.4         Glycemic control for adults with diabetes: <7.0     CBG: Recent Labs  Lab 05/19/21 1535 05/19/21 1915 05/19/21 2306 05/20/21 0315 05/20/21 0734  GLUCAP 143* 107* 137* 113* 117*    Critical care time: 33 minutes    The patient is critically ill due to respiratory failure, hypertensive crisis,  encephalopathy, stroke, subarachnoid hemorrhage, anemia.  Critical care was necessary to treat or prevent imminent or life-threatening deterioration.  Critical care was time spent personally by me on the following activities: development of treatment plan with patient and/or surrogate as well as nursing, discussions with consultants, evaluation of patient's response to treatment, examination of patient, obtaining history from patient or surrogate, ordering and performing treatments and interventions, ordering and review of laboratory studies, ordering and review of radiographic studies, pulse oximetry, re-evaluation of patient's condition and participation in multidisciplinary rounds.   Critical Care Time devoted to patient care services described in this note is 34 minutes. This time reflects time of care of  this Ericson . This critical care time does not reflect separately billable procedures or procedure time, teaching time or supervisory time of PA/NP/Med student/Med Resident etc but could involve care discussion time.       Spero Geralds [facility] 05/20/2021 10:49 AM  Pager: see AMION  If no response to pager , please call critical care on call (see AMION) until 7pm After 7:00 pm call Elink

## 2021-05-20 NOTE — Progress Notes (Signed)
STROKE TEAM PROGRESS NOTE   INTERVAL HISTORY RN at the bedside. Pt still intubated, on vent, eyes closed, not open with voice, not following commands on sedation today. Still moving right UE and LE with pain stimulation or suctioning but not as active as yesterday. Still has extensive vaginal bleeding and Hb drop to 6.5, needing another PRBC transfusion. On precedex and SBT, hoping to extubate sometime soon.    Vitals:   05/20/21 1000 05/20/21 1013 05/20/21 1028 05/20/21 1100  BP: (!) 162/68 (!) 162/68  119/72  Pulse: 84 86  83  Resp: 19 17  17   Temp:  99.7 F (37.6 C) 99.8 F (37.7 C)   TempSrc:  Axillary Axillary   SpO2: 100% 100%  100%  Weight:      Height:       CBC:  Recent Labs  Lab 05/15/21 0430 05/15/21 1221 05/19/21 1033 05/20/21 0356  WBC 10.2   < > 23.1* 20.9*  NEUTROABS 8.7*  --   --   --   HGB 8.0*   < > 7.6* 6.5*  HCT 27.4*   < > 26.4* 23.1*  MCV 70.8*   < > 73.5* 74.0*  PLT 356   < > 243 233   < > = values in this interval not displayed.   Basic Metabolic Panel:  Recent Labs  Lab 05/17/21 0415 05/17/21 1023 05/19/21 1033 05/19/21 1601 05/20/21 0356 05/20/21 1001  NA 150*   < > 156*   < > 156* 157*  K 3.5   < > 4.3  --  4.5  --   CL 123*   < > >130*  --  >130*  --   CO2 16*   < > 18*  --  21*  --   GLUCOSE 127*   < > 128*  --  117*  --   BUN 14   < > 20  --  25*  --   CREATININE 1.48*   < > 1.54*  --  1.51*  --   CALCIUM 8.6*   < > 9.3  --  9.0  --   MG 2.0  --   --   --   --   --   PHOS 3.3  --   --   --   --   --    < > = values in this interval not displayed.   Lipid Panel:  Recent Labs  Lab 05/16/21 0457 05/18/21 0127  CHOL 131  --   TRIG 178* 77  HDL 19*  --   CHOLHDL 6.9  --   VLDL 36  --   LDLCALC 76  --    HgbA1c:  Recent Labs  Lab 05/16/21 0457  HGBA1C 5.4   Urine Drug Screen:  Recent Labs  Lab 05/15/21 0445  LABOPIA NONE DETECTED  COCAINSCRNUR NONE DETECTED  LABBENZ NONE DETECTED  AMPHETMU NONE DETECTED  THCU  NONE DETECTED  LABBARB NONE DETECTED    Alcohol Level  Recent Labs  Lab 05/15/21 0438  ETH <10    IMAGING  CT HEAD WO CONTRAST Result Date: 05/16/2021  IMPRESSION:  1. Continued interval evolution of moderately large right MCA distribution infarct. Associated regional mass effect has mildly increased, with new 2 mm of right-to-left midline shift. No hydrocephalus or trapping.  2. Decreased prominence of subarachnoid hemorrhage at the posterior right Sylvian fissure and overlying right frontoparietal region. Trace intraventricular hemorrhage related to redistribution.  3. Question new area of parenchymal hypodensity  involving the lateral left cerebellum. While this finding is favored to be artifactual, a possible new acute ischemic infarct is difficult to exclude, and could be considered in the correct clinical setting. Attention at follow-up recommended.  4. No other new acute intracranial abnormality.   CT HEAD WO CONTRAST Result Date: 05/15/2021  IMPRESSION: Small-to-moderate volume hyperdensity within the right sylvian fissure and along portions of the right frontoparietal and temporal lobes compatible with extravasated contrast/subarachnoid hemorrhage. There is hyperdensity within the right basal ganglia, right insula, lateral right frontal lobe/frontal operculum and within portions of the right parietal lobe likely reflecting contrast staining at sites of acute infarction. Mild associated mass effect with subtle partial effacement of the right lateral ventricle. No midline shift or hydrocephalus. Embolization material now overlies the lateral right parietal lobe. Stable background parenchymal atrophy and chronic small vessel ischemic disease. Redemonstrated chronic infarct within the superior left cerebellum.   MR BRAIN WO CONTRAST Result Date: 05/15/2021 IMPRESSION:  1. Moderately large acute right MCA infarct. Associated petechial hemorrhage, subarachnoid hemorrhage, and minimal  intraventricular hemorrhage.  2. Severe chronic small vessel ischemic disease with multiple chronic infarcts.   MR CERVICAL SPINE WO CONTRAST Result Date: 05/15/2021  IMPRESSION: The cord itself appears normal. No disc disease. No spinal stenosis. 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 No significant encroachment upon the neural spaces. Some prevertebral soft tissue edema. In this clinical scenario, this may be subsequent to previous endotracheal intubation. One could not rule out pharyngitis or cellulitis but that seems less likely. I do not see any traumatic injury to the cervical spine itself. Electronically Signed   By: Nelson Chimes M.D.   On: 05/15/2021 16:05   IR CT Head Ltd Result Date: 05/15/2021 CT of the head was obtained and post processed in a separate workstation with concurrent attending physician supervision. Selected images were sent to PACS. Small right sylvian contrast extravasation confirmed. Additionally, hyperdensity of the right basal ganglia and lateral right frontal cortex are noted, related to ongoing ischemia. Right internal carotid artery angiograms with frontal and lateral views of the head showed occlusion of the embolized branch with no evidence of contrast extravasation. Patency of the remaining MCA branches noted.  IR PERCUTANEOUS ART THROMBECTOMY/INFUSION INTRACRANIAL INC DIAG ANGIO Result Date: 05/15/2021 IMPRESSION: Mechanical thrombectomy performed for treatment of right M1/MCA occlusion with clot fragmentation and embolization to same distal territory requiring multiple additional passes. Procedure complicated by contrast extravasation at a distal right M3/MCA branch requiring vessel embolization. Near complete recanalization of the right MCA vascular tree was achieved (TICI 2b). PLAN: - Bed rest post femoral access x6h - Follow-up CT in 4 hours to evaluate for SAH/contrast extravasation stability - Transfer to ICU- SBP: 120-140 mmHg   Echo Result  read: 05/16/2021 1. Left ventricular ejection fraction, by estimation, is 55 to 60%. The  left ventricle has normal function. The left ventricle has no regional  wall motion abnormalities. There is moderate concentric left ventricular  hypertrophy. Left ventricular  diastolic parameters are consistent with Grade II diastolic dysfunction  (pseudonormalization). Elevated left atrial pressure.  2. Right ventricular systolic function is normal. The right ventricular  size is normal. Mildly increased right ventricular wall thickness. There  is moderately elevated pulmonary artery systolic pressure. The estimated  right ventricular systolic pressure  is 65.6 mmHg.  3. Left atrial size was severely dilated.  4. Right atrial size was severely dilated.  5. There is no evidence of cardiac tamponade.  6. The mitral  valve is normal in structure. Trivial mitral valve  regurgitation. No evidence of mitral stenosis.  7. The aortic valve is normal in structure. There is mild calcification  of the aortic valve. There is mild thickening of the aortic valve. Aortic  valve regurgitation is not visualized. Mild aortic valve sclerosis is  present, with no evidence of aortic  valve stenosis.  8. The inferior vena cava is dilated in size with <50% respiratory  variability, suggesting right atrial pressure of 15 mmHg.   PHYSICAL EXAM  Temp:  [97.6 F (36.4 C)-101.6 F (38.7 C)] 99.8 F (37.7 C) (05/29 1028) Pulse Rate:  [76-106] 83 (05/29 1100) Resp:  [10-27] 17 (05/29 1100) BP: (119-214)/(52-92) 119/72 (05/29 1100) SpO2:  [97 %-100 %] 100 % (05/29 1100) FiO2 (%):  [30 %-40 %] 40 % (05/29 0717) Weight:  [75.5 kg] 75.5 kg (05/29 0400)  General - Well nourished, well developed, intubated on sedation.  Ophthalmologic - fundi not visualized due to noncooperation.  Cardiovascular - Regular rhythm and rate  Neuro - intubated on sedation, eyes closed, not open with voice stimulation, not follow  commands. With forced eye opening, eyes in right gaze preference, barely cross midline, not blinking to visual threat on the left, not tracking, PERRL. B/l eyelid mildly swollen. Corneal reflex present, gag and cough present. Breathing over the vent.  Facial symmetry not able to test due to ET tube.  Tongue protrusion not cooperative. On pain stimulation, left UE and LE flaccid. Right UE slight withdraw and RLE at least 3-/5 with spontaneous movement in bed with pain stimulation or suctioning. No babinski. Sensation, coordination and gait not tested.   ASSESSMENT/PLAN [name] is a 45 y.o. female with history of uncontrolled HTN, tobacco use disorder, large uterine fibroids, heart failure, paroxysmal SVT, paranoid psychosis who presents with altered mental status after being found down.  CT head initially read as small acute appearing perforator infarct in the R BG and notable for severe small vessel ischemia. However concerning for potential R MCA stroke and thus CTA head and neck and CTP was obtained with a R MCA M1 occlusion and a core of 50cc and 30ml of penumbra.   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   CTA neck: LVO right M1 origin. Left vertebral occlusion at its origin. High-grade narrowings of the right cavernous ICA.  CT perfusion suggestive 50 cc core infarct and 50 cc of adjacent penumbra.  MRI: large acute right MCA infarct. Associated petechial hemorrhage, subarachnoid hemorrhage, and minimal intraventricular hemorrhage.  CT 5/28 stable right MCA infarct with MLS  2D Echo 55 to 60%  Further cardioembolic work up once pt condition stabilized   UDS: negative  LDL 76  HgbA1c 5.4  UDS neg  Sickle cell screen - neg, ANA pending  VTE prophylaxis - SCDs  No  antithrombotic prior to admission, no antithrombotics now due to [facility] and severe vaginal bleeding.   Therapy recommendations:  CIR  Disposition:  Pending  Cytotoxic cerebral edema  Increase mass effect with new 83mm of right to left midline shift  On 3% saline @ 30  Na 157->156->157  Na goal 150-160  Check sodium Q6  CT repeat in am  Respiratory failure  Intubated 5/26 for increased work of breathing with altered mental status   Management  per CCM, appreciated  Switched to precedex and attempting SBTs   Wean off vent as able  Acute blood loss anemia Large uterine fibroids  hgb 7.8->5.9->PRBC->7.6->6.5->PRBC  PRBC tranfusion again today  extensive virginal bleeding from extremely large abdominopelvic mass measuring up to 22.9 cm noted on CT 5/26  Has been followed with Dr. Jonna Clark - poor surgical candidate due to uncontrolled HTN and SVT  Abdominal distention on exam  Was on Megace 40 bid but can increase to 80mg  bid if heavy bleeding PTA per Dr. Elgie Congo  Will start megace 80mg  bid  Will consider OBGYN consult if bleeding not improve on megace  Dysphagia  Likely due to stroke  SLP following  cortrak placement  Nutrition consult for tube feed recs  Paroxysmal SVT history Hx of CHF  Followed with cardiology   EF 40-45% in 04/2020 and 20-25% in 12/2020  This admission EF 55-60%  on amiodarone 200mg  daily and isosorbide 20 tid  Currently heart rate controlled  Hypertension  Home meds:  Hydralazine 100mg  TID, Imdur 60mg  daily, Toprol XL 200mg  daily  Off Cleviprex now  Add Amlodipine 10mg  daily  Add clonidine 0.2 tid  Resume home medications  SBP goal to <160 mmHg  Labetalol prn . Long-term BP goal normotensive  Hyperlipidemia  Home meds:  none  LDL 76, goal < 70  Add atorvastatin 20mg  daily  Continue statin at discharge  Tobacco abuse  Current smoker  Smoking cessation counseling will be provided  Other Stroke Risk  Factors  Coronary artery disease  Other Active Problems  AKI: creatinine 1.27->1.48->1.54->1.51  Leukocytosis, WBC 12.7->14.0->23.1->20.9   05/20/2021 11:08 AM  This patient is critically ill due to right MCA infarct, SAH, hemorrhagic conversion, severe anemia due to vaginal bleeding, hx of CHF and SVT and at significant risk of neurological worsening, death form recurrent stroke, hemorrhagic conversion, renal failure, heart failure, seizure. This patient's care requires constant monitoring of vital signs, hemodynamics, respiratory and cardiac monitoring, review of multiple databases, neurological assessment, discussion with family, other specialists and medical decision making of high complexity. I spent 40 minutes of neurocritical care time in the care of this patient.   Rosalin Hawking, MD PhD Stroke Neurology 05/20/2021 11:08 AM     To contact Stroke Continuity provider, please refer to [email or web address] After hours, contact General Neurology

## 2021-05-20 NOTE — Progress Notes (Signed)
[facility] Progress Note [name] DOB: [birth year] MRN: [id number]   Date of Service  05/20/2021  HPI/Events of Note  Patient needs right wrist restraints to prevent self-extubation.  eICU Interventions  Soft right wrist restraints ordered.        Holly Bradley 05/20/2021, 2:53 AM

## 2021-05-20 NOTE — Progress Notes (Signed)
[facility] Progress Note [name] DOB: [birth year] MRN: [id number]   Date of Service  05/20/2021  HPI/Events of Note  Patient with 3 % saline induced hypernatremia  And hyperchloremia, 3 % saline has been discontinued and patient is receiving Q 4 hourly BMP.  eICU Interventions  Continue to monitor hypernatremia and hyperchloremia to resolution over the next 24 - 48 hours.        Kerry Kass Veleka Djordjevic 05/20/2021, 6:21 AM

## 2021-05-20 NOTE — Progress Notes (Signed)
Physical Therapy Re-Evaluation Patient Details Name: Holly Bradley MRN: [id number] DOB: [birth year] Today's Date: 05/20/2021   History of Present Illness  45 yo female s/p mechanical thrombectomy on 5/24 of right MCA M1/M2/M3 and embolization of right MCA M3 branch due to contrast extravasation. Pt re-intubated 5/26. Repeat head CT on 5/26 showed increased cytotoxic edema and mildly increased leftward midline shift, now 5 mm, decreased SAH and IVH. Abdominal CT showing 22.9 cm mass, possibly consistent with uterine fibroids, no lymphadenopathy. PMH fibriods HTN HF paranoid psychosis recent hospitalizations january and february 2022    Clinical Impression  Pt presents with condition above and deficits mentioned below. Pt re-intubated and with increased midline shift noted with repeat head CT. PTA, pt was independent and lived with her brother and worked in [employer]. Currently, pt is very lethargic, opening eyes only briefly with dependently placed in sitting EOB. Pt not following any commands this date, thus needing TA for all bed mobility. Pt presents with L hemiparesis, balance deficits, decreased sensation in her L leg (noted by no response to painful stimuli), and bil lower extremity clonus. In sitting position, demonstrates left truncal rotation, lateral lean, pushing with RUE. Pt maintains neck in anterior and R lateral flexion, needing gentle PROM and positioning in L sidelying end of session to try to reduce. Given PLOF, prior motivation before being re-intubated and family support, recommend CIR to address deficits and maximize functional mobility provided pt improves to be able to participate in future sessions. Will continue to follow acutely.     Follow Up Recommendations CIR;Supervision/Assistance - 24 hour    Equipment Recommendations  3in1 (PT);Wheelchair (measurements PT);Wheelchair cushion (measurements PT);Hospital bed;Other (comment) (lift; may change with progression)     Recommendations for Other Services Rehab consult     Precautions / Restrictions Precautions Precautions: Fall;Other (comment) Precaution Comments: L hemiparesis, PRAFO, hand splint, intubated, SBP < 160, R mitten, R wrist restraint Restrictions Weight Bearing Restrictions: No      Mobility  Bed Mobility Overal bed mobility: Needs Assistance Bed Mobility: Supine to Sit;Sit to Supine;Rolling Rolling: Total assist;+2 for safety/equipment   Supine to sit: Total assist;+2 for safety/equipment;HOB elevated Sit to supine: Total assist;+2 for physical assistance;+2 for safety/equipment   General bed mobility comments: Pt with no active movement to assist with bed mobility, needing TA for all.    Transfers                 General transfer comment: Cued pt to stand but no activation noted by pt and pt remained lethargic with eyes closed, thus deferred this date  Ambulation/Gait             General Gait Details: Unable  Stairs            Wheelchair Mobility    Modified Rankin (Stroke Patients Only) Modified Rankin (Stroke Patients Only) Pre-Morbid Rankin Score: No symptoms Modified Rankin: Severe disability     Balance Overall balance assessment: Needs assistance Sitting-balance support: Single extremity supported;Feet supported Sitting balance-Leahy Scale: Zero Sitting balance - Comments: MaxA-TA to maintain static sitting balance EOB with pt keep R hand on bed or able to weight bear on R elbow when placed in this position but still needing extensive assistance to maintain her balance. No reactional strategies noted. Pt keeps R knee extended despite max cues to place R foot on floor. Pt opening eyes for brief period at start of being transitioned to sit EOB. Postural control: Posterior lean;Left lateral lean  Standing balance comment: No activation by pt, deferred for safety purposes                             Pertinent Vitals/Pain Pain  Assessment: Faces Faces Pain Scale: Hurts little more Pain Location: with noxious stimuli, including oral suctioning by RN Pain Descriptors / Indicators: Grimacing Pain Intervention(s): Limited activity within patient's tolerance;Monitored during session;Repositioned    Home Living Family/patient expects to be discharged to:: Private residence Living Arrangements: Other relatives (brother) Available Help at Discharge: Family;Available 24 hours/day Type of Home: House Home Access: Stairs to enter   [employer] of Steps: 1 (through garage) Home Layout: Two level Home Equipment: None Additional Comments: 1/2 bath on main floor    Prior Function Level of Independence: Independent         Comments: Works in [employer] Hand: Right    Extremity/Trunk Assessment   Upper Extremity Assessment Upper Extremity Assessment: LUE deficits/detail;Generalized weakness LUE Deficits / Details: flaccid with 1 finger width gap noted at shoulder joint    Lower Extremity Assessment Lower Extremity Assessment: Generalized weakness;LLE deficits/detail (clonus noted bil; slight pull away from painful stimuli at R lower extremity) LLE Deficits / Details: No active movement noted, no reaction to painful stimuli distally, clunus noted    Cervical / Trunk Assessment Cervical / Trunk Assessment: Other exceptions Cervical / Trunk Exceptions: R neck rotation flexion preference  Communication   Communication: Expressive difficulties  Cognition Arousal/Alertness: Lethargic Behavior During Therapy: Flat affect Overall Cognitive Status: Difficult to assess                                 General Comments: Pt lethargic and intubated at this time, difficult to assess formally. Not following any commands this date.      General Comments General comments (skin integrity, edema, etc.): intubated on vent settings PSV/CPAP 40% PEEP 5, SpO2 >/= 95% and HR  80-90s, SBP in 160s at start with RN going to get BP meds and approved PT to proceed with session; positoined pt end of session rolled towards L with gravity assisting stretch of neck    Exercises Other Exercises Other Exercises: PROM to neck into extension and L lateral flexion, positioned pt rolled to L to allow gravity to assist stretch end of session   Assessment/Plan    PT Assessment Patient needs continued PT services  PT Problem List Decreased strength;Decreased activity tolerance;Decreased balance;Decreased mobility;Decreased cognition;Impaired tone;Decreased range of motion;Decreased coordination;Decreased knowledge of use of DME;Decreased safety awareness;Impaired sensation       PT Treatment Interventions Functional mobility training;Therapeutic activities;Therapeutic exercise;Balance training;Neuromuscular re-education;Cognitive remediation;Patient/family education;DME instruction;Gait training;Stair training;Wheelchair mobility training    PT Goals (Current goals can be found in the Care Plan section)  Acute Rehab PT Goals Patient Stated Goal: none stated PT Goal Formulation: Patient unable to participate in goal setting Time For Goal Achievement: 06/03/21 Potential to Achieve Goals: Fair    Frequency Min 4X/week   Barriers to discharge        Co-evaluation               AM-PAC PT "6 Clicks" Mobility  Outcome Measure Help needed turning from your back to your side while in a flat bed without using bedrails?: Total Help needed moving from lying on your back to sitting on the side of  a flat bed without using bedrails?: Total Help needed moving to and from a bed to a chair (including a wheelchair)?: Total Help needed standing up from a chair using your arms (e.g., wheelchair or bedside chair)?: Total Help needed to walk in hospital room?: Total Help needed climbing 3-5 steps with a railing? : Total 6 Click Score: 6    End of Session Equipment Utilized During  Treatment: Oxygen Activity Tolerance: Patient limited by lethargy Patient left: in bed;with call bell/phone within reach;with bed alarm set;with restraints reapplied;with SCD's reapplied Nurse Communication: Mobility status;Other (comment) (vitals, RN present throughout session to assist PT with lines etc) PT Visit Diagnosis: Hemiplegia and hemiparesis;Other abnormalities of gait and mobility (R26.89);Unsteadiness on feet (R26.81);Muscle weakness (generalized) (M62.81);Difficulty in walking, not elsewhere classified (R26.2);Other symptoms and signs involving the nervous system (R29.898);Apraxia (R48.2) Hemiplegia - Right/Left: Left Hemiplegia - dominant/non-dominant: Non-dominant Hemiplegia - caused by: Cerebral infarction;Nontraumatic SAH;Nontraumatic intracerebral hemorrhage    Time: 1350-1416 PT Time Calculation (min) (ACUTE ONLY): 26 min   Charges:   PT Evaluation $PT Re-evaluation: 1 Re-eval PT Treatments $Therapeutic Activity: 8-22 mins        Moishe Spice, PT, DPT Acute Rehabilitation Services  Pager: [phone] Office: [place] 05/20/2021, 2:50 PM

## 2021-05-21 ENCOUNTER — Inpatient Hospital Stay (HOSPITAL_COMMUNITY): Payer: Medicaid Other

## 2021-05-21 DIAGNOSIS — R509 Fever, unspecified: Secondary | ICD-10-CM

## 2021-05-21 DIAGNOSIS — I639 Cerebral infarction, unspecified: Secondary | ICD-10-CM

## 2021-05-21 DIAGNOSIS — I63511 Cerebral infarction due to unspecified occlusion or stenosis of right middle cerebral artery: Secondary | ICD-10-CM | POA: Diagnosis not present

## 2021-05-21 DIAGNOSIS — D72829 Elevated white blood cell count, unspecified: Secondary | ICD-10-CM | POA: Diagnosis not present

## 2021-05-21 LAB — CBC
HCT: 24.8 % — ABNORMAL LOW (ref 36.0–46.0)
HCT: 23.3 % — ABNORMAL LOW (ref 36.0–46.0)
Hemoglobin: 7.2 g/dL — ABNORMAL LOW (ref 12.0–15.0)
Hemoglobin: 6.7 g/dL — CL (ref 12.0–15.0)
MCH: 21.1 pg — ABNORMAL LOW (ref 26.0–34.0)
MCH: 20.7 pg — ABNORMAL LOW (ref 26.0–34.0)
MCHC: 29 g/dL — ABNORMAL LOW (ref 30.0–36.0)
MCHC: 28.8 g/dL — ABNORMAL LOW (ref 30.0–36.0)
MCV: 72.5 fL — ABNORMAL LOW (ref 80.0–100.0)
MCV: 71.9 fL — ABNORMAL LOW (ref 80.0–100.0)
Platelets: 234 10*3/uL (ref 150–400)
Platelets: 263 10*3/uL (ref 150–400)
RBC: 3.42 MIL/uL — ABNORMAL LOW (ref 3.87–5.11)
RBC: 3.24 MIL/uL — ABNORMAL LOW (ref 3.87–5.11)
RDW: 20.2 % — ABNORMAL HIGH (ref 11.5–15.5)
RDW: 20.4 % — ABNORMAL HIGH (ref 11.5–15.5)
WBC: 20.6 10*3/uL — ABNORMAL HIGH (ref 4.0–10.5)
WBC: 19.1 10*3/uL — ABNORMAL HIGH (ref 4.0–10.5)
nRBC: 0.2 % (ref 0.0–0.2)
nRBC: 0.3 % — ABNORMAL HIGH (ref 0.0–0.2)

## 2021-05-21 LAB — BASIC METABOLIC PANEL
Anion gap: 6 (ref 5–15)
Anion gap: 3 — ABNORMAL LOW (ref 5–15)
BUN: 30 mg/dL — ABNORMAL HIGH (ref 6–20)
BUN: 28 mg/dL — ABNORMAL HIGH (ref 6–20)
CO2: 21 mmol/L — ABNORMAL LOW (ref 22–32)
CO2: 24 mmol/L (ref 22–32)
Calcium: 9.1 mg/dL (ref 8.9–10.3)
Calcium: 9 mg/dL (ref 8.9–10.3)
Chloride: 128 mmol/L — ABNORMAL HIGH (ref 98–111)
Chloride: 130 mmol/L — ABNORMAL HIGH (ref 98–111)
Creatinine, Ser: 1.52 mg/dL — ABNORMAL HIGH (ref 0.44–1.00)
Creatinine, Ser: 1.75 mg/dL — ABNORMAL HIGH (ref 0.44–1.00)
GFR, Estimated: 42 mL/min — ABNORMAL LOW (ref >60)
GFR, Estimated: 36 mL/min — ABNORMAL LOW (ref >60)
Glucose, Bld: 140 mg/dL — ABNORMAL HIGH (ref 70–99)
Glucose, Bld: 134 mg/dL — ABNORMAL HIGH (ref 70–99)
Potassium: 4.3 mmol/L (ref 3.5–5.1)
Potassium: 4.3 mmol/L (ref 3.5–5.1)
Sodium: 155 mmol/L — ABNORMAL HIGH (ref 135–145)
Sodium: 157 mmol/L — ABNORMAL HIGH (ref 135–145)

## 2021-05-21 LAB — GLUCOSE, CAPILLARY
Glucose-Capillary: 112 mg/dL — ABNORMAL HIGH (ref 70–99)
Glucose-Capillary: 107 mg/dL — ABNORMAL HIGH (ref 70–99)
Glucose-Capillary: 139 mg/dL — ABNORMAL HIGH (ref 70–99)
Glucose-Capillary: 119 mg/dL — ABNORMAL HIGH (ref 70–99)
Glucose-Capillary: 121 mg/dL — ABNORMAL HIGH (ref 70–99)
Glucose-Capillary: 124 mg/dL — ABNORMAL HIGH (ref 70–99)

## 2021-05-21 LAB — IRON AND TIBC
Iron: 60 ug/dL (ref 28–170)
Saturation Ratios: 30 % (ref 10.4–31.8)
TIBC: 197 ug/dL — ABNORMAL LOW (ref 250–450)
UIBC: 137 ug/dL

## 2021-05-21 LAB — SODIUM
Sodium: 156 mmol/L — ABNORMAL HIGH (ref 135–145)
Sodium: 156 mmol/L — ABNORMAL HIGH (ref 135–145)

## 2021-05-21 LAB — FERRITIN: Ferritin: 162 ng/mL (ref 11–307)

## 2021-05-21 LAB — VITAMIN B12: Vitamin B-12: 481 pg/mL (ref 180–914)

## 2021-05-21 LAB — PREPARE RBC (CROSSMATCH)

## 2021-05-21 LAB — TRIGLYCERIDES: Triglycerides: 37 mg/dL (ref <150)

## 2021-05-21 IMAGING — CT CT HEAD W/O CM
2 of 3 series · 13 of 30 positions shown, 16 images · non-contrast
Comparison: Prior CT from [DATE].

CLINICAL DATA: Follow-up examination for stroke.

EXAM:
CT HEAD WITHOUT CONTRAST
TECHNIQUE: Contiguous axial images were obtained from the base of the skull
through the vertex without intravenous contrast.

[Series 3: head wo · axial · 0.47mm/px · z∈[-144,-68]mm · 3 of 31 slices shown]
[im 8/31  brain]
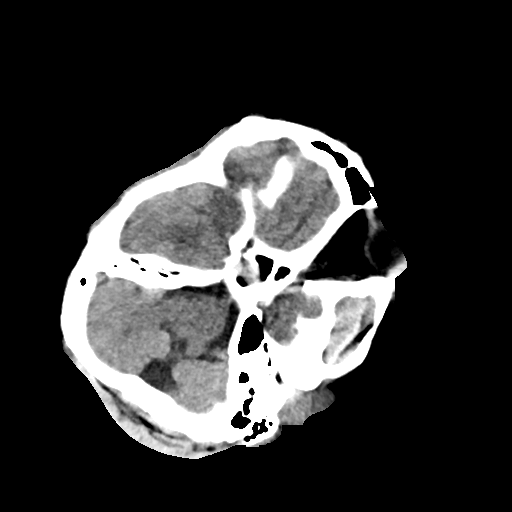
[im 16/31  brain]
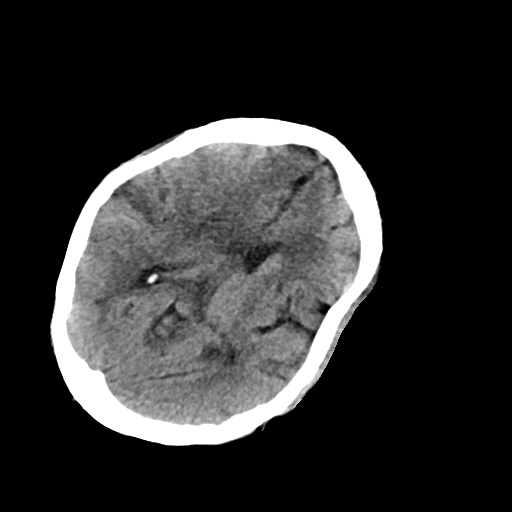
[im 23/31  brain]
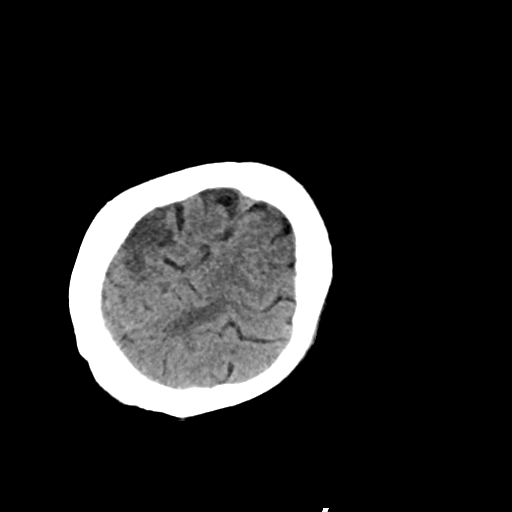

[Series 8: head axial · axial · 0.37mm/px · z∈[-181,-62]mm · 10 of 76 slices shown, 13 images]
[im 7/76  brain]
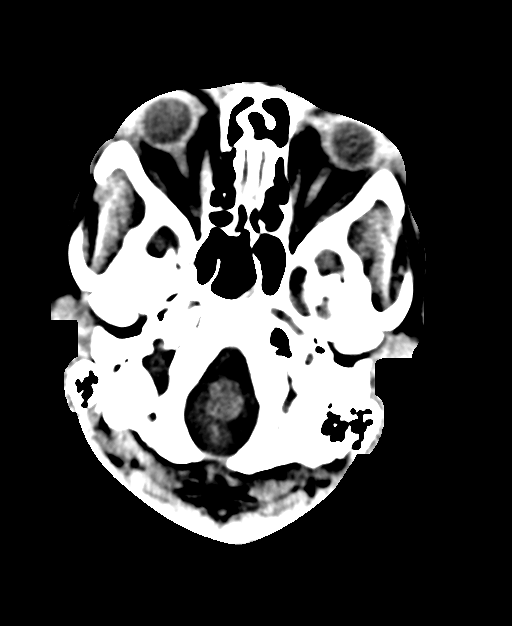
[im 7/76  bone]
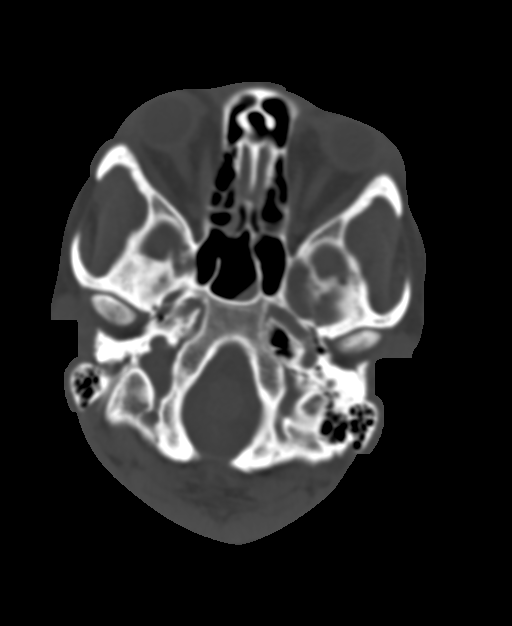
[im 14/76  brain]
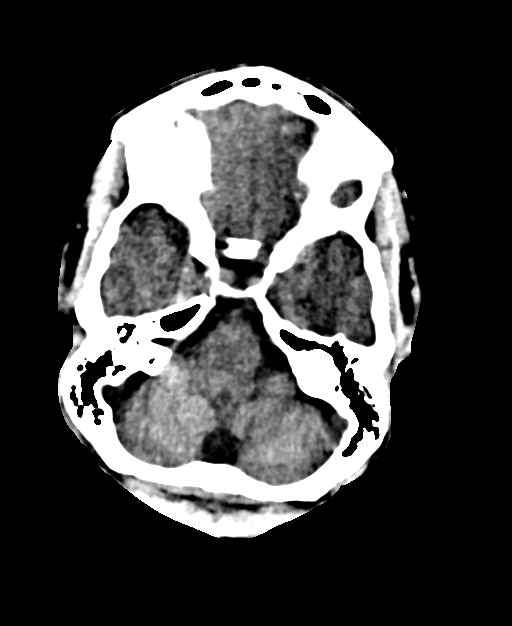
[im 21/76  brain]
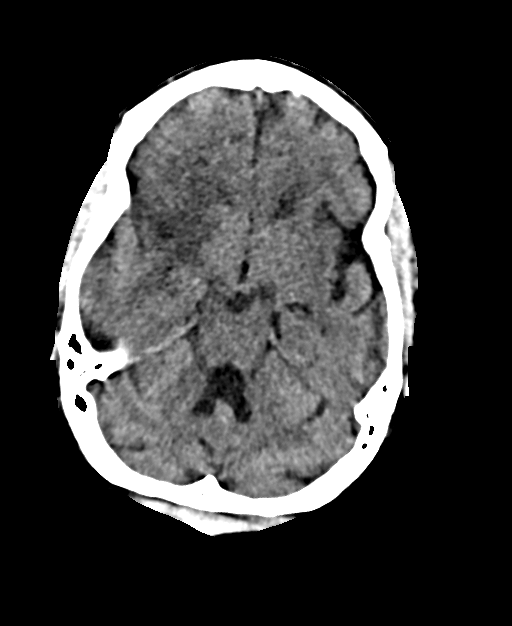
[im 28/76  brain]
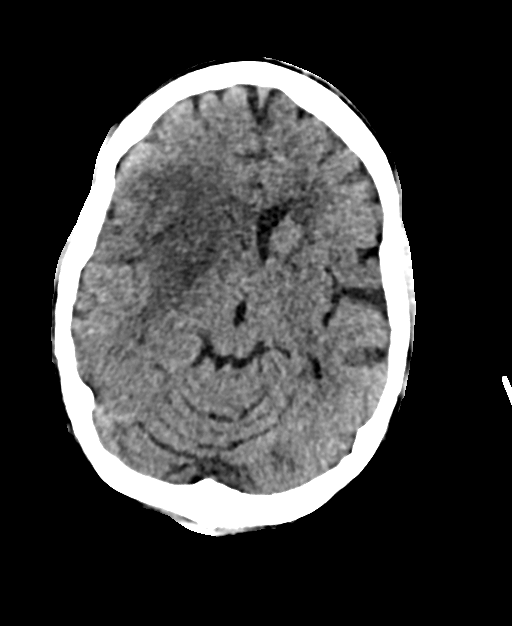
[im 35/76  brain]
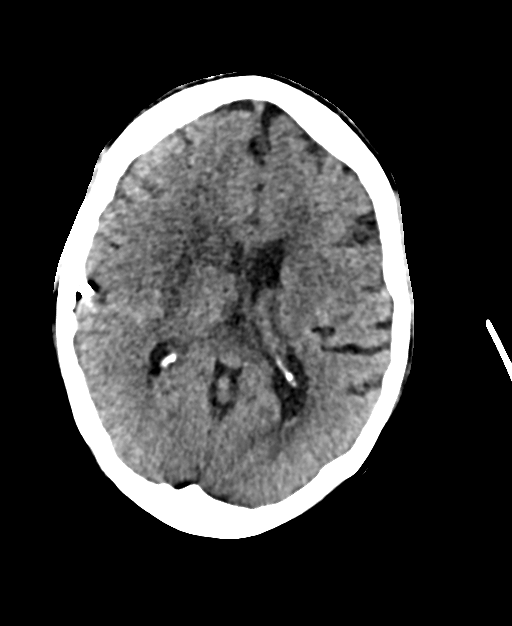
[im 35/76  bone]
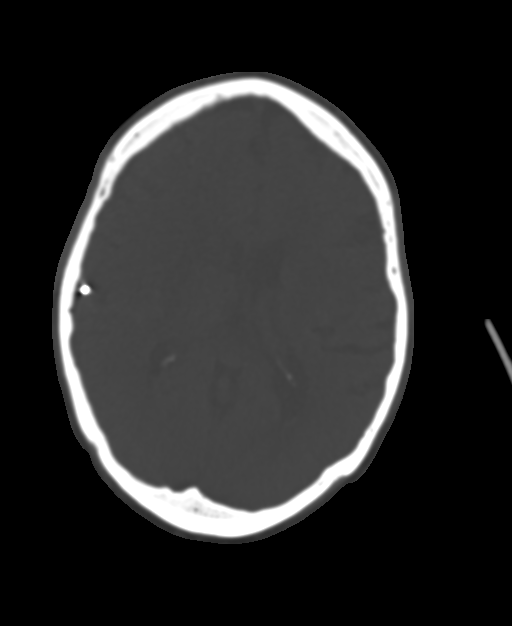
[im 41/76  brain]
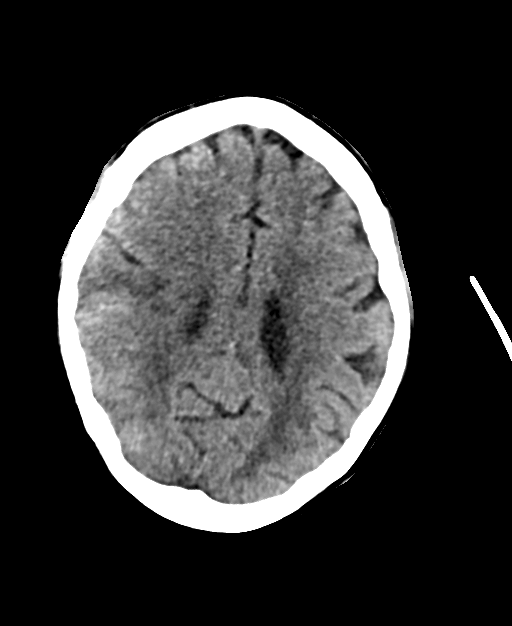
[im 48/76  brain]
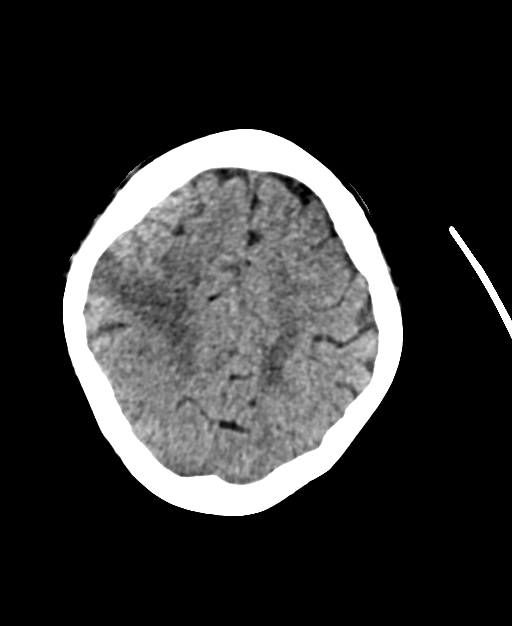
[im 55/76  brain]
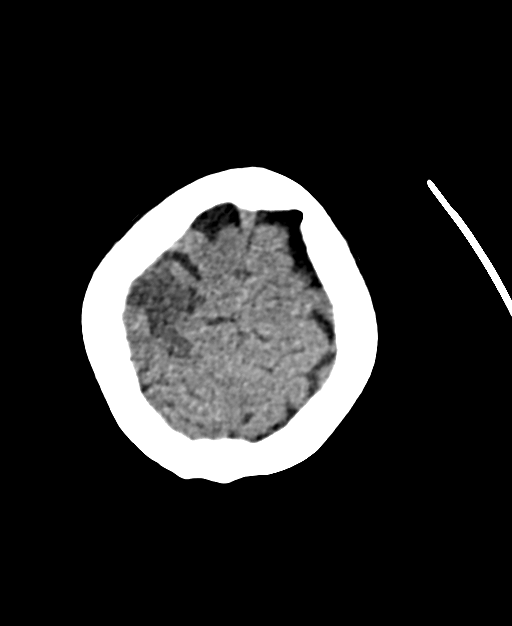
[im 62/76  brain]
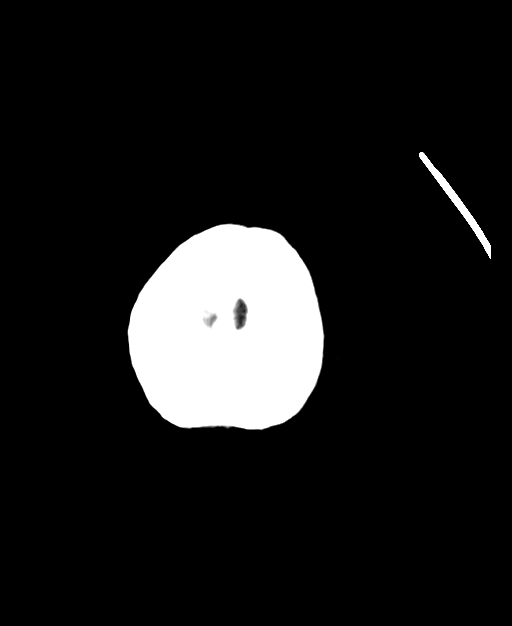
[im 62/76  bone]
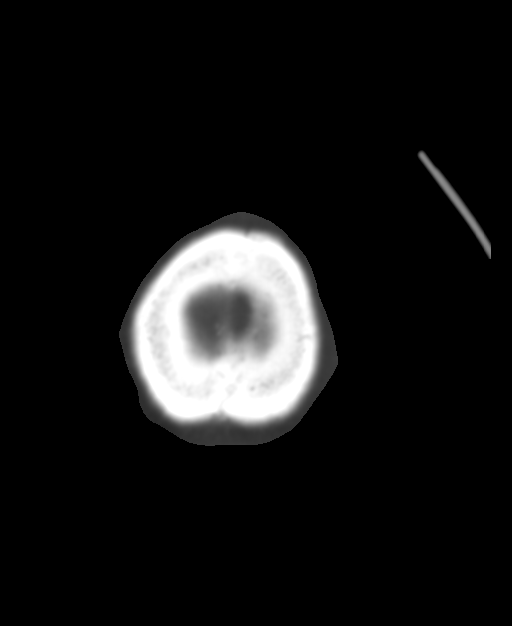
[im 69/76  brain]
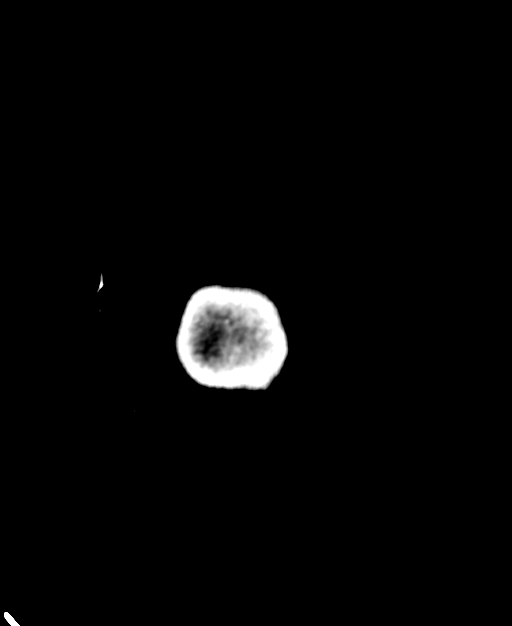

[13 of 30 positions shown; findings below may reference images not displayed]

FINDINGS: Brain: Continued interval evolution of cytotoxic edema involving the
right frontoparietal region and right basal ganglia, consistent with
right MCA distribution infarct. Overall size and extent is
relatively stable. Localized edema with partial effacement of the
right lateral ventricle and 5 mm of right-to-left shift, not
significantly changed. No hydrocephalus or trapping. No evidence for
new or interval hemorrhage.

Underlying chronic microvascular ischemic disease with chronic left
cerebellar and brainstem infarcts again noted. No other visible new
area of infarction apparent vague parenchymal hypodensity overlying
the high anterior frontal lobes near the vertex on the non corrected
axial images favored to be artifactual due to patient positioning
(series 3, image 24). No extra-axial collection.

Vascular: No hyperdense vessel. Right MCA branch embolization coil
again noted.

Skull: No new or progressive finding.

Sinuses/Orbits: Globes and orbital soft tissues demonstrate no new
finding. Nasogastric tube in place with scattered mucosal thickening
throughout the sphenoid ethmoidal sinuses.

Other: None.
IMPRESSION: 1. Continued interval evolution of right MCA distribution infarct,
stable in size and extent from previous. Associated edema with 5 mm
of right-to-left midline shift, not significantly changed. No
hydrocephalus or trapping. No new intracranial hemorrhage.
2. No other new acute intracranial abnormality.

## 2021-05-21 IMAGING — DX DG CHEST 1V PORT
1 series · 1 of 1 positions shown · non-contrast
Comparison: [DATE]

CLINICAL DATA: Fever, altered mental status

EXAM:
PORTABLE CHEST 1 VIEW

[chest ap]
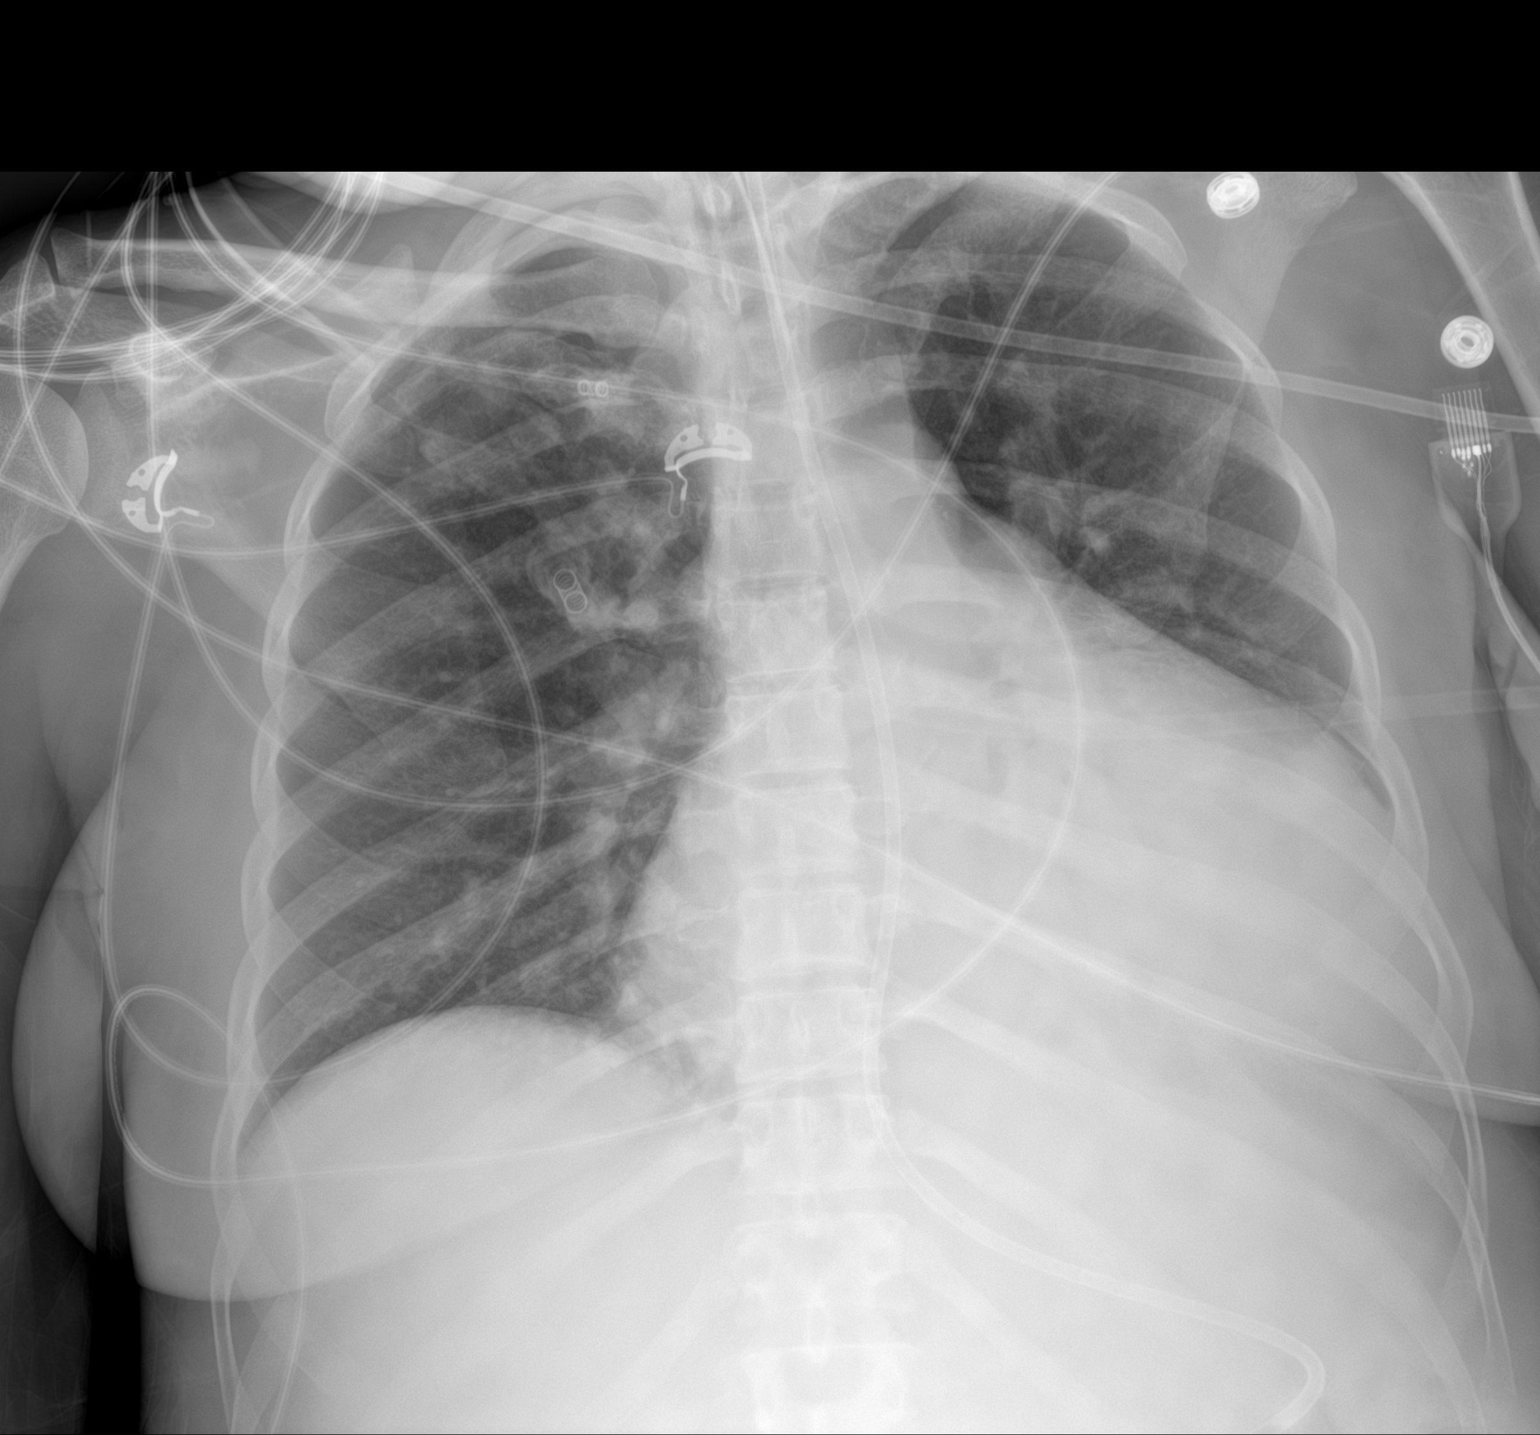

[1 of 1 positions shown; findings below may reference images not displayed]

FINDINGS: Nasogastric tube coursing below the diaphragm projecting over the
stomach with the tip excluded from the field of view. Endotracheal
tube with the tip 6.6 cm above the carina.

Left basilar airspace disease. Otherwise no focal parenchymal
opacity. No pleural effusion or pneumothorax. Stable cardiomegaly.

No acute osseous abnormality.
IMPRESSION: 1. Nasogastric tube coursing below the diaphragm projecting over the
stomach with the tip excluded from the field of view.
2. Endotracheal tube with the tip 6.6 cm above the carina. Recommend
advancing the endotracheal tube 2 cm.
3. Left lower lobe airspace disease which may reflect atelectasis
versus pneumonia.

## 2021-05-21 MED ORDER — SODIUM CHLORIDE 0.9 % IV SOLN
125.0000 mg | Freq: Every day | INTRAVENOUS | Status: AC
  Administered 2021-05-21 – 2021-05-23 (×3): 125 mg via INTRAVENOUS
  Filled 2021-05-21 (×4): qty 10

## 2021-05-21 MED ORDER — CEFAZOLIN SODIUM-DEXTROSE 2-4 GM/100ML-% IV SOLN
2.0000 g | Freq: Three times a day (TID) | INTRAVENOUS | Status: AC
  Administered 2021-05-21 – 2021-05-28 (×21): 2 g via INTRAVENOUS
  Filled 2021-05-21 (×22): qty 100

## 2021-05-21 MED ORDER — CLONIDINE HCL 0.2 MG PO TABS
0.2000 mg | ORAL_TABLET | Freq: Three times a day (TID) | ORAL | Status: DC
  Administered 2021-05-21 – 2021-05-25 (×13): 0.2 mg
  Filled 2021-05-21 (×13): qty 1

## 2021-05-21 MED ORDER — OSMOLITE 1.5 CAL PO LIQD
1000.0000 mL | ORAL | Status: DC
  Administered 2021-05-21 – 2021-06-05 (×11): 1000 mL
  Filled 2021-05-21 (×6): qty 1000

## 2021-05-21 MED ORDER — FENTANYL CITRATE (PF) 100 MCG/2ML IJ SOLN: 50.0000 ug | INTRAMUSCULAR | Status: DC | PRN

## 2021-05-21 MED ORDER — HYDRALAZINE HCL 50 MG PO TABS
100.0000 mg | ORAL_TABLET | Freq: Three times a day (TID) | ORAL | Status: DC
  Administered 2021-05-21 – 2021-06-21 (×92): 100 mg
  Filled 2021-05-21 (×92): qty 2

## 2021-05-21 MED ORDER — AMLODIPINE BESYLATE 5 MG PO TABS
5.0000 mg | ORAL_TABLET | Freq: Two times a day (BID) | ORAL | Status: DC
  Administered 2021-05-22 – 2021-05-29 (×16): 5 mg
  Filled 2021-05-21 (×16): qty 1

## 2021-05-21 MED ORDER — SODIUM CHLORIDE 0.9% IV SOLUTION: Freq: Once | INTRAVENOUS | Status: AC

## 2021-05-21 MED ORDER — AMLODIPINE BESYLATE 5 MG PO TABS: 5.0000 mg | ORAL_TABLET | Freq: Two times a day (BID) | ORAL | Status: DC

## 2021-05-21 MED ORDER — IRON DEXTRAN 50 MG/ML IJ SOLN: 100.0000 mg | Freq: Every day | INTRAMUSCULAR | Status: DC

## 2021-05-21 NOTE — Progress Notes (Signed)
Physical Therapy Treatment Patient Details Name: Holly Bradley MRN: [id number] DOB: 1976/04/15 Today's Date: 05/21/2021    History of Present Illness 45 yo female s/p mechanical thrombectomy on 5/24 of right MCA M1/M2/M3 and embolization of right MCA M3 branch due to contrast extravasation. Pt re-intubated 5/26. Repeat head CT on 5/26 showed increased cytotoxic edema and mildly increased leftward midline shift, now 5 mm, decreased SAH and IVH. Abdominal CT showing 22.9 cm mass, possibly consistent with uterine fibroids, no lymphadenopathy. PMH fibriods HTN HF paranoid psychosis recent hospitalizations january and february 2022    PT Comments    The pt was seen by PT/OT this session with goal of increased response to stimuli and use of changing positions to increase arousal. The pt did open eyes intermittently to pain and demo withdraw from noxious stimuli in BLE, but did not follow any commands or otherwise demo any purposeful movement at this time. The pt's HR increased with elevation of HOB to 60 deg.  Updated recommendation to [facility] at this time due to vent dependency and current pt presentation.    Follow Up Recommendations  LTACH;Supervision/Assistance - 24 hour     Equipment Recommendations  3in1 (PT);Wheelchair (measurements PT);Wheelchair cushion (measurements PT);Hospital bed;Other (comment) (hoyer lift)    Recommendations for Other Services       Precautions / Restrictions Precautions Precautions: Fall;Other (comment) Precaution Comments: L hemiparesis, PRAFO, hand splint, intubated, SBP < 160, R mitten, Restrictions Weight Bearing Restrictions: No    Mobility  Bed Mobility Overal bed mobility: Needs Assistance   Rolling: Total assist;+2 for safety/equipment         General bed mobility comments: pt placed in chair position due to BP needs with increase with positioning SBP 158 with just bed movement. pt very diaphoretic and noted to have wet gown from sweat     Transfers                 General transfer comment: not appropriate at this time due to arousal     Modified Rankin (Stroke Patients Only) Modified Rankin (Stroke Patients Only) Pre-Morbid Rankin Score: No symptoms Modified Rankin: Severe disability     Balance Overall balance assessment: Needs assistance   Sitting balance-Leahy Scale: Zero Sitting balance - Comments: dependent on support from bed to maintain, assist for head positioning                                    Cognition Arousal/Alertness: Lethargic Behavior During Therapy: Flat affect Overall Cognitive Status: Difficult to assess                                 General Comments: pt opening eyes with increased HOB and reposition. pt with R side movement with oral suction and neck rotation to toward midline      Exercises Other Exercises Other Exercises: PROM neck toward midline with arousal due to discomfort Other Exercises: PROM L UE shoulder wrist elbow digits Other Exercises: splint check with modificiation for thumb fit    General Comments General comments (skin integrity, edema, etc.): Vent 4-% peep5% oral intubation, SBP 158 with HOB increased. pt with skin tone palor in appear with prolonged sitting so returned to supine      Pertinent Vitals/Pain Pain Assessment: Faces Faces Pain Scale: Hurts little more Pain Location: with noxious stimuli, including oral suctioning by  RN Pain Descriptors / Indicators: Grimacing Pain Intervention(s): Limited activity within patient's tolerance;Repositioned           PT Goals (current goals can now be found in the care plan section) Acute Rehab PT Goals Patient Stated Goal: none stated PT Goal Formulation: Patient unable to participate in goal setting Time For Goal Achievement: 06/03/21 Potential to Achieve Goals: Fair Progress towards PT goals: Not progressing toward goals - comment    Frequency    Min  2X/week      PT Plan Discharge plan needs to be updated    Co-evaluation PT/OT/SLP Co-Evaluation/Treatment: Yes Reason for Co-Treatment: Complexity of the patient's impairments (multi-system involvement);Necessary to address cognition/behavior during functional activity;For patient/therapist safety PT goals addressed during session: Mobility/safety with mobility;Strengthening/ROM OT goals addressed during session: ADL's and self-care      AM-PAC PT "6 Clicks" Mobility   Outcome Measure  Help needed turning from your back to your side while in a flat bed without using bedrails?: Total Help needed moving from lying on your back to sitting on the side of a flat bed without using bedrails?: Total Help needed moving to and from a bed to a chair (including a wheelchair)?: Total Help needed standing up from a chair using your arms (e.g., wheelchair or bedside chair)?: Total Help needed to walk in hospital room?: Total Help needed climbing 3-5 steps with a railing? : Total 6 Click Score: 6    End of Session Equipment Utilized During Treatment: Oxygen Activity Tolerance: Patient limited by lethargy Patient left: in bed;with call bell/phone within reach;with bed alarm set;with restraints reapplied;with SCD's reapplied Nurse Communication: Mobility status (vitals, temperature) PT Visit Diagnosis: Hemiplegia and hemiparesis;Other abnormalities of gait and mobility (R26.89);Unsteadiness on feet (R26.81);Muscle weakness (generalized) (M62.81);Difficulty in walking, not elsewhere classified (R26.2);Other symptoms and signs involving the nervous system (R29.898);Apraxia (R48.2) Hemiplegia - Right/Left: Left Hemiplegia - dominant/non-dominant: Non-dominant Hemiplegia - caused by: Cerebral infarction;Nontraumatic SAH;Nontraumatic intracerebral hemorrhage     Time: 1215-1234 PT Time Calculation (min) (ACUTE ONLY): 19 min  Charges:                        Hardie Pulley, DPT   Acute  Rehabilitation Department Pager #: [phone]   Otho Bellows 05/21/2021, 2:11 PM

## 2021-05-21 NOTE — Progress Notes (Signed)
STROKE TEAM PROGRESS NOTE   INTERVAL HISTORY RN and Dr. Lynetta Mare are at the bedside. Pt still intubated, on vent, eyes closed, not open with voice, wiggle right toes on commands but not follow other commands. Vaginal bleeding seems improving and Hb at 7.2 post transfusion. BP fluctuate, will adjust BP meds timing.    Vitals:   05/21/21 0800 05/21/21 0815 05/21/21 0900 05/21/21 1000  BP: (!) 155/71  (!) 155/70 135/62  Pulse: 93 97 94 95  Resp: 20 (!) 23 18 (!) 8  Temp: 100.1 F (37.8 C)     TempSrc: Axillary     SpO2: 100% 100% 100% 99%  Weight:      Height:       CBC:  Recent Labs  Lab 05/15/21 0430 05/15/21 1221 05/20/21 0356 05/20/21 1633 05/21/21 0402  WBC 10.2   < > 20.9*  --  20.6*  NEUTROABS 8.7*  --   --   --   --   HGB 8.0*   < > 6.5* 7.0* 7.2*  HCT 27.4*   < > 23.1* 24.3* 24.8*  MCV 70.8*   < > 74.0*  --  72.5*  PLT 356   < > 233  --  234   < > = values in this interval not displayed.   Basic Metabolic Panel:  Recent Labs  Lab 05/17/21 0415 05/17/21 1023 05/20/21 0356 05/20/21 1001 05/20/21 2135 05/21/21 0402  NA 150*   < > 156*   < > 156* 155*  K 3.5   < > 4.5  --   --  4.3  CL 123*   < > >130*  --   --  128*  CO2 16*   < > 21*  --   --  21*  GLUCOSE 127*   < > 117*  --   --  140*  BUN 14   < > 25*  --   --  30*  CREATININE 1.48*   < > 1.51*  --   --  1.52*  CALCIUM 8.6*   < > 9.0  --   --  9.1  MG 2.0  --   --   --   --   --   PHOS 3.3  --   --   --   --   --    < > = values in this interval not displayed.   Lipid Panel:  Recent Labs  Lab 05/16/21 0457 05/18/21 0127 05/21/21 0402  CHOL 131  --   --   TRIG 178*   < > 37  HDL 19*  --   --   CHOLHDL 6.9  --   --   VLDL 36  --   --   LDLCALC 76  --   --    < > = values in this interval not displayed.   HgbA1c:  Recent Labs  Lab 05/16/21 0457  HGBA1C 5.4   Urine Drug Screen:  Recent Labs  Lab 05/15/21 0445  LABOPIA NONE DETECTED  COCAINSCRNUR NONE DETECTED  LABBENZ NONE DETECTED   AMPHETMU NONE DETECTED  THCU NONE DETECTED  LABBARB NONE DETECTED    Alcohol Level  Recent Labs  Lab 05/15/21 0438  ETH <10    IMAGING  CT HEAD WO CONTRAST Result Date: 05/16/2021  IMPRESSION:  1. Continued interval evolution of moderately large right MCA distribution infarct. Associated regional mass effect has mildly increased, with new 2 mm of right-to-left midline shift. No hydrocephalus or trapping.  2. Decreased prominence of subarachnoid hemorrhage at the posterior right Sylvian fissure and overlying right frontoparietal region. Trace intraventricular hemorrhage related to redistribution.  3. Question new area of parenchymal hypodensity involving the lateral left cerebellum. While this finding is favored to be artifactual, a possible new acute ischemic infarct is difficult to exclude, and could be considered in the correct clinical setting. Attention at follow-up recommended.  4. No other new acute intracranial abnormality.   CT HEAD WO CONTRAST Result Date: 05/15/2021  IMPRESSION: Small-to-moderate volume hyperdensity within the right sylvian fissure and along portions of the right frontoparietal and temporal lobes compatible with extravasated contrast/subarachnoid hemorrhage. There is hyperdensity within the right basal ganglia, right insula, lateral right frontal lobe/frontal operculum and within portions of the right parietal lobe likely reflecting contrast staining at sites of acute infarction. Mild associated mass effect with subtle partial effacement of the right lateral ventricle. No midline shift or hydrocephalus. Embolization material now overlies the lateral right parietal lobe. Stable background parenchymal atrophy and chronic small vessel ischemic disease. Redemonstrated chronic infarct within the superior left cerebellum.   MR BRAIN WO CONTRAST Result Date: 05/15/2021 IMPRESSION:  1. Moderately large acute right MCA infarct. Associated petechial hemorrhage, subarachnoid  hemorrhage, and minimal intraventricular hemorrhage.  2. Severe chronic small vessel ischemic disease with multiple chronic infarcts.   MR CERVICAL SPINE WO CONTRAST Result Date: 05/15/2021  IMPRESSION: The cord itself appears normal. No disc disease. No spinal stenosis. 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 No significant encroachment upon the neural spaces. Some prevertebral soft tissue edema. In this clinical scenario, this may be subsequent to previous endotracheal intubation. One could not rule out pharyngitis or cellulitis but that seems less likely. I do not see any traumatic injury to the cervical spine itself. Electronically Signed   By: Nelson Chimes M.D.   On: 05/15/2021 16:05   IR CT Head Ltd Result Date: 05/15/2021 CT of the head was obtained and post processed in a separate workstation with concurrent attending physician supervision. Selected images were sent to PACS. Small right sylvian contrast extravasation confirmed. Additionally, hyperdensity of the right basal ganglia and lateral right frontal cortex are noted, related to ongoing ischemia. Right internal carotid artery angiograms with frontal and lateral views of the head showed occlusion of the embolized branch with no evidence of contrast extravasation. Patency of the remaining MCA branches noted.  IR PERCUTANEOUS ART THROMBECTOMY/INFUSION INTRACRANIAL INC DIAG ANGIO Result Date: 05/15/2021 IMPRESSION: Mechanical thrombectomy performed for treatment of right M1/MCA occlusion with clot fragmentation and embolization to same distal territory requiring multiple additional passes. Procedure complicated by contrast extravasation at a distal right M3/MCA branch requiring vessel embolization. Near complete recanalization of the right MCA vascular tree was achieved (TICI 2b). PLAN: - Bed rest post femoral access x6h - Follow-up CT in 4 hours to evaluate for SAH/contrast extravasation stability - Transfer to ICU- SBP: 120-140  mmHg   Echo Result read: 05/16/2021 1. Left ventricular ejection fraction, by estimation, is 55 to 60%. The  left ventricle has normal function. The left ventricle has no regional  wall motion abnormalities. There is moderate concentric left ventricular  hypertrophy. Left ventricular  diastolic parameters are consistent with Grade II diastolic dysfunction  (pseudonormalization). Elevated left atrial pressure.  2. Right ventricular systolic function is normal. The right ventricular  size is normal. Mildly increased right ventricular wall thickness. There  is moderately elevated pulmonary artery systolic pressure. The estimated  right ventricular systolic pressure  is  53.7 mmHg.  3. Left atrial size was severely dilated.  4. Right atrial size was severely dilated.  5. There is no evidence of cardiac tamponade.  6. The mitral valve is normal in structure. Trivial mitral valve  regurgitation. No evidence of mitral stenosis.  7. The aortic valve is normal in structure. There is mild calcification  of the aortic valve. There is mild thickening of the aortic valve. Aortic  valve regurgitation is not visualized. Mild aortic valve sclerosis is  present, with no evidence of aortic  valve stenosis.  8. The inferior vena cava is dilated in size with <50% respiratory  variability, suggesting right atrial pressure of 15 mmHg.   PHYSICAL EXAM  Temp:  [98.2 F (36.8 C)-101.9 F (38.8 C)] 100.1 F (37.8 C) (05/30 0800) Pulse Rate:  [78-100] 95 (05/30 1000) Resp:  [8-25] 8 (05/30 1000) BP: (119-185)/(57-82) 135/62 (05/30 1000) SpO2:  [94 %-100 %] 99 % (05/30 1000) FiO2 (%):  [40 %] 40 % (05/30 0815) Weight:  [75.5 kg] 75.5 kg (05/30 0432)  General - Well nourished, well developed, intubated on precedex.  Ophthalmologic - fundi not visualized due to noncooperation.  Cardiovascular - Regular rhythm and intermittent tachycardia  Neuro - intubated on precedex, eyes closed, not open  with voice stimulation, not follow commands except wiggle right toes on command. With forced eye opening, eyes in right forced gaze preference, not blinking to visual threat on the left, not tracking, PERRL. B/l eyelids mildly swollen. Corneal reflex present, gag and cough present. Breathing over the vent.  Facial symmetry not able to test due to ET tube.  Tongue protrusion not cooperative. On pain stimulation, left UE and LE flaccid. Right UE no movement and RLE slight withdraw. No babinski. Sensation, coordination and gait not tested.   ASSESSMENT/PLAN Holly Bradley is a 45 y.o. female with history of uncontrolled HTN, tobacco use disorder, large uterine fibroids, heart failure, paroxysmal SVT, paranoid psychosis who presents with altered mental status after being found down.  CT head initially read as small acute appearing perforator infarct in the R BG and notable for severe small vessel ischemia. However concerning for potential R MCA stroke and thus CTA head and neck and CTP was obtained with a R MCA M1 occlusion and a core of 50cc and 57ml of penumbra.   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   CTA neck: LVO right M1 origin. Left vertebral occlusion at its origin. High-grade narrowings of the right cavernous ICA.  CT perfusion suggestive 50 cc core infarct and 50 cc of adjacent penumbra.  MRI: large acute right MCA infarct. Associated petechial hemorrhage, subarachnoid hemorrhage, and minimal intraventricular hemorrhage.  CT 5/28 stable right MCA infarct with MLS  CT 5/30 stable right MCA infarct and MLS 22mm  2D Echo 55 to 60%  Further cardioembolic work up once pt condition stabilized   UDS: negative  LDL 76  HgbA1c 5.4  UDS neg  Sickle cell screen - neg, ANA pending  VTE  prophylaxis - SCDs  No antithrombotic prior to admission, no antithrombotics now due to severe vaginal bleeding. Once Hb stable, will consider ASA.  Therapy recommendations:  CIR  Disposition:  Pending  Cytotoxic cerebral edema  On 3% saline @ 30 -> off  Na 157->156->157->155  Na goal 150-160  Check sodium Q6  CT head 5/30 stable right MCA infarcts and MLS 86mm  Respiratory failure  Intubated 5/26 for increased work of breathing with altered mental status   Management per CCM, appreciated  Switched to precedex and attempting SBTs   Wean off vent as able  Acute blood loss anemia Large uterine fibroids  hgb 7.8->5.9->PRBC->7.6->6.5->PRBC->7.2  PRBC tranfusion again today  extensive virginal bleeding from extremely large abdominopelvic mass measuring up to 22.9 cm noted on CT 5/26  Has been followed with Dr. Jonna Clark - poor surgical candidate due to uncontrolled HTN and SVT  Abdominal distention on exam  Was on Megace 40 bid but can increase to 80mg  bid if heavy bleeding PTA per Dr. Elgie Congo  On megace 80mg  bid  Will consider OBGYN consult if bleeding not improve on megace  Dysphagia  Likely due to stroke  SLP following  cortrak placement  On TF @ 50  Paroxysmal SVT history Hx of CHF  Followed with cardiology   EF 40-45% in 04/2020 and 20-25% in 12/2020  This admission EF 55-60%  on amiodarone 200mg  daily and isosorbide 20 tid  Currently heart rate controlled  Hypertension  Home meds:  Hydralazine 100mg  TID, Imdur 60mg  daily, Toprol XL 200mg  daily  Off Cleviprex now  Amlodipine 10mg  daily -> 5mg  bid  clonidine 0.2 tid -> Q8h  Hydralazine 100mg  tid -> q8h  imdur 20mg  tid -> Q8h  Metoprolol 100mg  bid  SBP goal to <160 mmHg  Labetalol prn . Long-term BP goal normotensive  Hyperlipidemia  Home meds:  none  LDL 76, goal < 70  Add atorvastatin 20mg  daily  Continue statin at discharge  Tobacco abuse  Current smoker  Smoking  cessation counseling will be provided  Fever and Leukocytosis  WBC 12.7->14.0->23.1->20.9->20.6  Tmax 101.9  Sputum culture - staph Aures   Blood culture pending  CXR pendng  CCM on board  Other Stroke Risk Factors  Coronary artery disease  Other Active Problems  AKI: creatinine 1.27->1.48->1.54->1.51->1.52   05/21/2021 10:30 AM  This patient is critically ill due to right MCA infarcts, severe anemia, hx of SVT and CHF, positive sputum culture, fever and leukocytosis and at significant risk of neurological worsening, death form brain herniation, hypertensive encephalopathy, sepsis, heart failure. seizure. This patient's care requires constant monitoring of vital signs, hemodynamics, respiratory and cardiac monitoring, review of multiple databases, neurological assessment, discussion with family, other specialists and medical decision making of high complexity. I spent 40 minutes of neurocritical care time in the care of this patient.  Rosalin Hawking, MD PhD Stroke Neurology 05/21/2021 10:30 AM     To contact Stroke Continuity provider, please refer to [email or web address] After hours, contact General Neurology

## 2021-05-21 NOTE — Progress Notes (Signed)
Occupational Therapy Treatment Patient Details Name: Holly Bradley MRN: [id number] DOB: [birth year] Today's Date: 05/21/2021    History of present illness 45 yo female s/p mechanical thrombectomy on 5/24 of right MCA M1/M2/M3 and embolization of right MCA M3 branch due to contrast extravasation. Pt re-intubated 5/26. Repeat head CT on 5/26 showed increased cytotoxic edema and mildly increased leftward midline shift, now 5 mm, decreased SAH and IVH. Abdominal CT showing 22.9 cm mass, possibly consistent with uterine fibroids, no lymphadenopathy. PMH fibriods HTN HF paranoid psychosis recent hospitalizations january and february 2022   OT comments  Pt with downgrade recommendation to d/c planning due to decreased participation. Pt with minimal responses that are localized to the stimuli. Pt with increased HR 130 and SBP 158 with chair positioning. Pt noted to have large volume of output with reposition and opening eyes briefly. Pt does not follow any simple commands. Pt moving R LE once in chair position. Pt noted to have thick oral secretions that are leaving R side of mouth. Recommendation Ltach at this time due to vent dependency.    Follow Up Recommendations  LTACH (pending progress but downgraded at this time)    Equipment Recommendations  Wheelchair (measurements OT);Wheelchair cushion (measurements OT);Hospital bed    Recommendations for Other Services      Precautions / Restrictions Precautions Precautions: Fall;Other (comment) Precaution Comments: L hemiparesis, PRAFO, hand splint, intubated, SBP < 160, R mitten,       Mobility Bed Mobility Overal bed mobility: Needs Assistance   Rolling: Total assist;+2 for safety/equipment         General bed mobility comments: pt placed in chair position due to BP needs with increase with positioning SBP 158 with just bed movement. pt very diaphoretic and noted to have wet gown from sweat    Transfers                  General transfer comment: not appropriate at this time due to arousal    Balance                                           ADL either performed or assessed with clinical judgement   ADL Overall ADL's : Needs assistance/impaired                                       General ADL Comments: total (A) for all adls. pt dependent in all care at this time. pt with change in status noted 5/26 with intubation     Vision   Additional Comments: pt with R gaze preference. pt opening eyes briefly with upright positioning. pt also noted to void urine/ blood at this time   Perception     Praxis      Cognition Arousal/Alertness: Lethargic Behavior During Therapy: Flat affect Overall Cognitive Status: Difficult to assess                                 General Comments: pt opening eyes with increased HOB and reposition. pt with R side movement with oral suction and neck rotation to toward midline        Exercises Exercises: Other exercises Other Exercises Other Exercises: PROM neck toward midline with arousal due  to discomfort Other Exercises: PROM L UE shoulder wrist elbow digits Other Exercises: splint check with modificiation for thumb fit   Shoulder Instructions       General Comments Vent 4-% peep5% oral intubation, SBP 158 with HOB increased. pt with skin tone palor in appear with prolonged sitting so returned to supine    Pertinent Vitals/ Pain       Pain Assessment: Faces Faces Pain Scale: Hurts little more Pain Location: with noxious stimuli, including oral suctioning by RN Pain Descriptors / Indicators: Grimacing Pain Intervention(s): Limited activity within patient's tolerance;Repositioned  Home Living                                          Prior Functioning/Environment              Frequency  Min 3X/week        Progress Toward Goals  OT Goals(current goals can now be found in the care  plan section)  Progress towards OT goals: Not progressing toward goals - comment  Acute Rehab OT Goals Patient Stated Goal: none stated OT Goal Formulation: Patient unable to participate in goal setting Time For Goal Achievement: 05/30/21 Potential to Achieve Goals: Good ADL Goals Pt Will Perform Grooming: with mod assist;sitting Additional ADL Goal #1: pt will static sit with mod (A) as precursor to adls Additional ADL Goal #2: pt will tolerate resting hand splints 4 hrs on and 4 hrs off Additional ADL Goal #3: pt will complete basic transfer total +2 mod (A) as precursor to adls  Plan Discharge plan needs to be updated    Co-evaluation    PT/OT/SLP Co-Evaluation/Treatment: Yes Reason for Co-Treatment: Complexity of the patient's impairments (multi-system involvement);Necessary to address cognition/behavior during functional activity;For patient/therapist safety;To address functional/ADL transfers   OT goals addressed during session: ADL's and self-care      AM-PAC OT "6 Clicks" Daily Activity     Outcome Measure   Help from another person eating meals?: Total Help from another person taking care of personal grooming?: Total Help from another person toileting, which includes using toliet, bedpan, or urinal?: Total Help from another person bathing (including washing, rinsing, drying)?: Total Help from another person to put on and taking off regular upper body clothing?: Total Help from another person to put on and taking off regular lower body clothing?: Total 6 Click Score: 6    End of Session Equipment Utilized During Treatment: Oxygen  OT Visit Diagnosis: Unsteadiness on feet (R26.81);Muscle weakness (generalized) (M62.81);Hemiplegia and hemiparesis Hemiplegia - Right/Left: Left Hemiplegia - dominant/non-dominant: Non-Dominant Hemiplegia - caused by: Cerebral infarction   Activity Tolerance Patient limited by lethargy   Patient Left in bed;with call bell/phone within  reach;with bed alarm set;with restraints reapplied;with SCD's reapplied   Nurse Communication Mobility status;Need for lift equipment;Precautions        Time: 1215-1232 OT Time Calculation (min): 17 min  Charges: OT General Charges $OT Visit: 1 Visit OT Treatments $Self Care/Home Management : 8-22 mins   Brynn, OTR/L  Acute Rehabilitation Services Pager: [phone] Office: [phone] .    Jeri Modena 05/21/2021, 1:33 PM

## 2021-05-21 NOTE — Progress Notes (Signed)
Pt transported to and from CT without event.  

## 2021-05-21 NOTE — Progress Notes (Signed)
[facility] Progress Note [name] DOB: [birth year] MRN: [id number]   Date of Service  05/21/2021  HPI/Events of Note  Anemia - Hgb = 6.7.  eICU Interventions  Will transfuse 1 unit PRBC.     Intervention Category Major Interventions: Other:  Lysle Dingwall 05/21/2021, 11:44 PM

## 2021-05-21 NOTE — Progress Notes (Signed)
NAME:  Holly Bradley, MRN:  [id number], DOB:  [birth year], LOS: 6  ADMISSION DATE:  05/15/2021, CONSULTATION DATE:  05/21/21 REFERRING MD:  Holly Bradley, CHIEF COMPLAINT:  Respiratory failure   History of Present Illness:  Holly Bradley is a [name] with PMH of HTN, tobacco use, CHF, DMT2 (controlled with diet and exercise), pSVT, uterine fibroids, paranoid psychosis who presented with AMS on 5/24.   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  On follow up imaging, she had stable appearing SAH and continued R basal ganglia infarct with 64mm R to L shift.   She was admitted to the Neuro ICU and treating with hypertonic saline, statin and BP control.  On 5/26, PCCM consulted when pt became acutely less responsive with respiratory distress along with abdominal distension worse than baseline, per patient.  She was intubated and repeat head CT, abd/pelvis CT ordered.  Repeat head CT on 5/26 showed increased cytotoxic edema and mildly increased leftward midline shift, now 5 mm, decreased SAH and IVH.  Abdominal CT showing 22.9 cm mass, possibly consistent with uterine fibroids, no lymphadenopathy.      Pertinent  Medical History  -CHF -tobacco use -DMT2 -HTN -pSVT -uterine fibroids -paranoid psychosis   Significant Hospital Events: Including procedures, antibiotic start and stop dates in addition to other pertinent events   . 5/24 Admit to neurology after ED visit with AMS, found to have R MCA stroke, underwent mechanical thrombectomy, M3 coil embolization, subsequent SAH . 5/26 decreased mental status and respiratory distress, intubated   Interim History / Subjective:   Remains minimally responsive.  Marked leukocytosis and febrile.  Objective   Blood pressure 135/62, pulse 94, temperature 100.1 F (37.8 C),  temperature source Axillary, resp. rate (!) 22, height 5\' 5"  (1.651 m), weight 75.5 kg, SpO2 100 %.    Vent Mode: PSV;CPAP FiO2 (%):  [40 %] 40 % Set Rate:  [20 bmp] 20 bmp Vt Set:  [450 mL-460 mL] 460 mL PEEP:  [5 cmH20] 5 cmH20 Pressure Support:  [10 cmH20] 10 cmH20 Plateau Pressure:  [18 cmH20] 18 cmH20   Intake/Output Summary (Last 24 hours) at 05/21/2021 1131 Last data filed at 05/21/2021 1000 Gross per 24 hour  Intake 1640.78 ml  Output 1600 ml  Net 40.78 ml   Filed Weights   05/18/21 0359 05/20/21 0400 05/21/21 0432  Weight: 76.3 kg 75.5 kg 75.5 kg    General:  Intubated, on on sedation.  HEENT: ETT, Cortrak in place Neuro: Unresponsive for me today.  CV: tachycardic, regular, HS normal  PULM:  CTAB, normal WOB, on full vent support.  Copious thick secretions. GI: soft with palpable firm uterine fibroid Extremities: no edema  Labs/imaging that I havepersonally reviewed  (right click and "Reselect all SmartList Selections" daily)  Iatrogenic hypernatremia at 156 Leukocytosis 20.6 Hemoglobin stable 7.2, microcytic hypochromic anemia consistent with iron deficiency Abundant Staphylococcus aureus in tracheal aspirate.  MRSA negative by PCR.  Assessment & Plan:   Critically ill due to acute large MCA stroke with subarachnoid hemorrhage and midline shift requiring mechanical ventilation for airway protection and hyperosmolar therapy for cerebral edema Critically ill due to acute hypoxic respiratory failure requiring mechanical ventilation Critically ill due to hypertension requiring titration of IV antihypertensive. Iron deficiency anemia due to uterine fibroids Staph aureus pneumonia  Plan:  -Allow sodium to spontaneously  correct -Daily SBT. -Given mental status, will likely need tracheostomy.  -Start cefazolin for Staph aureus -IV iron supplementation to replete stores. -Stop all IV sedatives, as needed only -Increase enteral antihypertensive agents.  Best  practice (right click and "Reselect all SmartList Selections" daily)  Diet:  NPO, tube feeds Pain/Anxiety/Delirium protocol (if indicated): No VAP protocol (if indicated): Yes DVT prophylaxis: SCD GI prophylaxis: PPI Glucose control:  SSI Yes Central venous access:  N/A Arterial line:  N/A Foley:  N/A Mobility:  bed rest  PT consulted: Yes Last date of multidisciplinary goals of care discussion [per primary.] Code Status:  full code Disposition: ICU  Labs   CBC: Recent Labs  Lab 05/15/21 0430 05/15/21 1221 05/18/21 0127 05/18/21 0905 05/19/21 1033 05/20/21 0356 05/20/21 1633 05/21/21 0402  WBC 10.2  --  12.7* 14.0* 23.1* 20.9*  --  20.6*  NEUTROABS 8.7*  --   --   --   --   --   --   --   HGB 8.0*   < > 5.9* 7.1* 7.6* 6.5* 7.0* 7.2*  HCT 27.4*   < > 21.2* 24.2* 26.4* 23.1* 24.3* 24.8*  MCV 70.8*  --  73.1* 73.3* 73.5* 74.0*  --  72.5*  PLT 356  --  128* 194 243 233  --  234   < > = values in this interval not displayed.    Basic Metabolic Panel: Recent Labs  Lab 05/17/21 0415 05/17/21 1023 05/17/21 1817 05/18/21 0127 05/18/21 0905 05/19/21 1033 05/19/21 1601 05/20/21 0356 05/20/21 1001 05/20/21 1633 05/20/21 2135 05/21/21 0402 05/21/21 1032  NA 150*   < > 158* 155*   < > 156*   < > 156* 157* 156* 156* 155* 156*  K 3.5  --  3.3* 3.9  --  4.3  --  4.5  --   --   --  4.3  --   CL 123*  --   --  129*  --  >130*  --  >130*  --   --   --  128*  --   CO2 16*  --   --  18*  --  18*  --  21*  --   --   --  21*  --   GLUCOSE 127*  --   --  115*  --  128*  --  117*  --   --   --  140*  --   BUN 14  --   --  17  --  20  --  25*  --   --   --  30*  --   CREATININE 1.48*  --   --  1.64*  --  1.54*  --  1.51*  --   --   --  1.52*  --   CALCIUM 8.6*  --   --  8.8*  --  9.3  --  9.0  --   --   --  9.1  --   MG 2.0  --   --   --   --   --   --   --   --   --   --   --   --   PHOS 3.3  --   --   --   --   --   --   --   --   --   --   --   --    < > = values in this  interval  not displayed.   GFR: Estimated Creatinine Clearance: 46.5 mL/min (A) (by C-G formula based on SCr of 1.52 mg/dL (H)). Recent Labs  Lab 05/18/21 0905 05/19/21 1033 05/20/21 0356 05/21/21 0402  WBC 14.0* 23.1* 20.9* 20.6*    Liver Function Tests: Recent Labs  Lab 05/15/21 0430  AST 26  ALT 19  ALKPHOS 88  BILITOT 1.0  PROT 7.6  ALBUMIN 3.5    Recent Labs  Lab 05/15/21 0438  AMMONIA <9*    ABG    Component Value Date/Time   PHART 7.333 (L) 05/17/2021 1817   PCO2ART 33.7 05/17/2021 1817   PO2ART 156 (H) 05/17/2021 1817   HCO3 17.6 (L) 05/17/2021 1817   TCO2 19 (L) 05/17/2021 1817   ACIDBASEDEF 7.0 (H) 05/17/2021 1817   O2SAT 99.0 05/17/2021 1817     HbA1C: Hgb A1c MFr Bld  Date/Time Value Ref Range Status  05/16/2021 04:57 AM 5.4 4.8 - 5.6 % Final    Comment:    (NOTE)         Prediabetes: 5.7 - 6.4         Diabetes: >6.4         Glycemic control for adults with diabetes: <7.0     CBG: Recent Labs  Lab 05/20/21 1914 05/20/21 2302 05/21/21 0306 05/21/21 0719 05/21/21 1107  GLUCAP 116* 120* 112* 107* 139*    CRITICAL CARE Performed by: Kipp Brood   Total critical care time: 40 minutes  Critical care time was exclusive of separately billable procedures and treating other patients.  Critical care was necessary to treat or prevent imminent or life-threatening deterioration.  Critical care was time spent personally by me on the following activities: development of treatment plan with patient and/or surrogate as well as nursing, discussions with consultants, evaluation of patient's response to treatment, examination of patient, obtaining history from patient or surrogate, ordering and performing treatments and interventions, ordering and review of laboratory studies, ordering and review of radiographic studies, pulse oximetry, re-evaluation of patient's condition and participation in multidisciplinary rounds.  Kipp Brood, MD  [facility] ICU Physician [place]  Pager: [phone] Mobile: [phone] After hours: [phone].

## 2021-05-21 NOTE — Progress Notes (Signed)
Inpatient Rehabilitation Admissions Coordinator  I am following patient's case at a distance.  Danne Baxter, RN, MSN Rehab Admissions Coordinator [phone] 05/21/2021 12:24 PM

## 2021-05-21 NOTE — Progress Notes (Signed)
SLP Cancellation Note  Patient Details Name: Holly Bradley MRN: [id number] DOB: 1976-08-17   Cancelled treatment:       Reason Eval/Treat Not Completed: Patient not medically ready   Bastien Strawser, Katherene Ponto 05/21/2021, 7:49 AM

## 2021-05-21 NOTE — Progress Notes (Signed)
Date and time results received: 05/21/21 2313  Test: Hemoglobin Critical Value: 6.7  Name of Provider Notified: Solmon Ice, RN (E-Link)  Orders Received? Or Actions Taken?: Awaiting new orders

## 2021-05-22 DIAGNOSIS — D219 Benign neoplasm of connective and other soft tissue, unspecified: Secondary | ICD-10-CM

## 2021-05-22 DIAGNOSIS — D259 Leiomyoma of uterus, unspecified: Secondary | ICD-10-CM

## 2021-05-22 DIAGNOSIS — I639 Cerebral infarction, unspecified: Secondary | ICD-10-CM | POA: Diagnosis not present

## 2021-05-22 DIAGNOSIS — N939 Abnormal uterine and vaginal bleeding, unspecified: Secondary | ICD-10-CM | POA: Diagnosis not present

## 2021-05-22 DIAGNOSIS — R509 Fever, unspecified: Secondary | ICD-10-CM | POA: Diagnosis not present

## 2021-05-22 DIAGNOSIS — I63511 Cerebral infarction due to unspecified occlusion or stenosis of right middle cerebral artery: Secondary | ICD-10-CM | POA: Diagnosis not present

## 2021-05-22 DIAGNOSIS — D72829 Elevated white blood cell count, unspecified: Secondary | ICD-10-CM | POA: Diagnosis not present

## 2021-05-22 LAB — RETICULOCYTES
Immature Retic Fract: 9.2 % (ref 2.3–15.9)
RBC.: 3.84 MIL/uL — ABNORMAL LOW (ref 3.87–5.11)
Retic Count, Absolute: 10.8 10*3/uL — ABNORMAL LOW (ref 19.0–186.0)
Retic Ct Pct: <0.4 % — ABNORMAL LOW (ref 0.4–3.1)

## 2021-05-22 LAB — BLOOD CULTURE ID PANEL (REFLEXED) - BCID2

## 2021-05-22 LAB — CULTURE, RESPIRATORY W GRAM STAIN

## 2021-05-22 LAB — GLUCOSE, CAPILLARY
Glucose-Capillary: 132 mg/dL — ABNORMAL HIGH (ref 70–99)
Glucose-Capillary: 109 mg/dL — ABNORMAL HIGH (ref 70–99)
Glucose-Capillary: 120 mg/dL — ABNORMAL HIGH (ref 70–99)
Glucose-Capillary: 129 mg/dL — ABNORMAL HIGH (ref 70–99)
Glucose-Capillary: 129 mg/dL — ABNORMAL HIGH (ref 70–99)
Glucose-Capillary: 132 mg/dL — ABNORMAL HIGH (ref 70–99)

## 2021-05-22 LAB — SODIUM
Sodium: 156 mmol/L — ABNORMAL HIGH (ref 135–145)
Sodium: 155 mmol/L — ABNORMAL HIGH (ref 135–145)
Sodium: 154 mmol/L — ABNORMAL HIGH (ref 135–145)

## 2021-05-22 LAB — HEMOGLOBIN AND HEMATOCRIT, BLOOD
HCT: 28.5 % — ABNORMAL LOW (ref 36.0–46.0)
Hemoglobin: 8.5 g/dL — ABNORMAL LOW (ref 12.0–15.0)

## 2021-05-22 LAB — FOLATE: Folate: 10.9 ng/mL (ref >5.9)

## 2021-05-22 MED ORDER — FREE WATER
100.0000 mL | Status: DC
  Administered 2021-05-22 – 2021-05-24 (×12): 100 mL

## 2021-05-22 NOTE — Progress Notes (Signed)
NAME:  Holly Bradley, MRN:  [id number], DOB:  November 13, 1976, LOS: 7  ADMISSION DATE:  05/15/2021, CONSULTATION DATE:  05/22/21 REFERRING MD:  Leonie Man, CHIEF COMPLAINT:  Respiratory failure   History of Present Illness:  [name] is a [name] with PMH of HTN, tobacco use, CHF, DMT2 (controlled with diet and exercise), pSVT, uterine fibroids, paranoid psychosis who presented with AMS on 5/24.   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  On follow up imaging, she had stable appearing SAH and continued R basal ganglia infarct with 74mm R to L shift.   She was admitted to the Neuro ICU and treating with hypertonic saline, statin and BP control.  On 5/26, PCCM consulted when pt became acutely less responsive with respiratory distress along with abdominal distension worse than baseline, per patient.  She was intubated and repeat head CT, abd/pelvis CT ordered.  Repeat head CT on 5/26 showed increased cytotoxic edema and mildly increased leftward midline shift, now 5 mm, decreased SAH and IVH.  Abdominal CT showing 22.9 cm mass, possibly consistent with uterine fibroids, no lymphadenopathy.      Pertinent  Medical History  -CHF -tobacco use -DMT2 -HTN -pSVT -uterine fibroids -paranoid psychosis   Significant Hospital Events: Including procedures, antibiotic start and stop dates in addition to other pertinent events   . 5/24 Admit to neurology after ED visit with AMS, found to have R MCA stroke, underwent mechanical thrombectomy, M3 coil embolization, subsequent SAH . 5/26 decreased mental status and respiratory distress, intubated   Interim History / Subjective:   Hgb 6.7 overnight; required 1 unit PRBC Intubated on PRVC. Minimally responsive on no sedation. Wiggles right toe to command. Fever/WBC trending  down Off cleviprex and hypertonic saline  Objective   Blood pressure (!) 158/76, pulse (!) 102, temperature 98.2 F (36.8 C), temperature source Axillary, resp. rate (!) 23, height 5\' 5"  (1.651 m), weight 71.6 kg, SpO2 100 %.    Vent Mode: PRVC FiO2 (%):  [40 %] 40 % Set Rate:  [20 bmp] 20 bmp Vt Set:  [460 mL] 460 mL PEEP:  [5 cmH20] 5 cmH20 Pressure Support:  [10 cmH20] 10 cmH20 Plateau Pressure:  [14 cmH20-16 cmH20] 14 cmH20   Intake/Output Summary (Last 24 hours) at 05/22/2021 0726 Last data filed at 05/22/2021 0600 Gross per 24 hour  Intake 1834.89 ml  Output 2000 ml  Net -165.11 ml   Filed Weights   05/20/21 0400 05/21/21 0432 05/22/21 0500  Weight: 75.5 kg 75.5 kg 71.6 kg    General: Critically ill appearing. Intubated   HEENT: MM pink/moist; Cortrak and ETT in place; Eyes rightward gaze Neuro: Minimally responsive. Wiggles right toe to command. Cough gag reflex intact. Pupils 57mm reactive. Corneal reflex sluggish. CV: s1s2, no m/r/g; hypertensive PULM: dim/clear BS bilaterally; Switched to PS 5/5 breathing spontaneously. GI: palpable firm uterine fibroid, bsx4 active  Extremities: warm/dry, no edema    Labs/imaging that I havepersonally reviewed  (right click and "Reselect all SmartList Selections" daily)   Na 157 (NA goal 150-160) BUN 28 from 30; Creatinine 1.75 from 1.52 WBC 19.1 from 20.6; Hgb 8.5; Abundant Staphylococcus aureus in tracheal aspirate.  MRSA negative by PCR.  CT Head 5/30: Stable R MCA infarct. 5 mm R to L midline shift (no significant change).   Assessment & Plan:   Critically  ill due to acute large MCA stroke with subarachnoid hemorrhage and midline shift requiring mechanical ventilation for airway protection and hyperosmolar therapy for cerebral edema Plan: -Will start free water at 100 Q4 to resolve iatrogenic hypernatremia back to normal; Na check q6 -Limit sedation for neuro checks -No antithrombotics due to vaginal bleeding; consider  ASA when Hgb stable -Will consult palliative due to poor prognosis  Critically ill due to acute hypoxic respiratory failure requiring mechanical ventilation Staph aureus pneumonia Plan: -Daily SBT; mental status remains barrier to extubation. -Pulm Hygiene -Continue Cefazolin X 7days -Trend Fever;CBC  Critically ill due to hypertension requiring titration of IV antihypertensive. Hx of HTN Plan: -SBP goal <160 -Continue enteral Amlodipine, clonidine, hydralazine, metoprolol  Iron deficiency anemia likely due to uterine fibroids Plan: -Continue IV iron -Transfuse for hgb < 7 -Consider OBGYN consult -Continue Megace per OBGYN -Poor surgical candidate given PMH  AKI (Creatinine trending down) Plan: -Avoid nephrotoxic agents; renal dose meds -Trend BMP  PSVT hx Hx of CHF (EF 20-25% 12/2020) Plan: - Continue Amio and Isosorbide - Continuous Telemetry monitoring  Hx of HTN, HLD Plan: -Continue statin -Continue enteral antihypertensives    Best practice (right click and "Reselect all SmartList Selections" daily)  Diet:  NPO, tube feeds Pain/Anxiety/Delirium protocol (if indicated): Yes (RASS goal 0) VAP protocol (if indicated): Yes DVT prophylaxis: SCD GI prophylaxis: PPI Glucose control:  SSI No Central venous access:  N/A Arterial line:  N/A Foley:  N/A Mobility:  bed rest  PT consulted: Yes Last date of multidisciplinary goals of care discussion [per primary.] Code Status:  full code Disposition: ICU  Labs   CBC: Recent Labs  Lab 05/18/21 0905 05/19/21 1033 05/20/21 0356 05/20/21 1633 05/21/21 0402 05/21/21 2239 05/22/21 0607  WBC 14.0* 23.1* 20.9*  --  20.6* 19.1*  --   HGB 7.1* 7.6* 6.5* 7.0* 7.2* 6.7* 8.5*  HCT 24.2* 26.4* 23.1* 24.3* 24.8* 23.3* 28.5*  MCV 73.3* 73.5* 74.0*  --  72.5* 71.9*  --   PLT 194 243 233  --  234 263  --     Basic Metabolic Panel: Recent Labs  Lab 05/17/21 0415 05/17/21 1023 05/18/21 0127 05/18/21 0905  05/19/21 1033 05/19/21 1601 05/20/21 0356 05/20/21 1001 05/20/21 2135 05/21/21 0402 05/21/21 1032 05/21/21 1611 05/21/21 2239  NA 150*   < > 155*   < > 156*   < > 156*   < > 156* 155* 156* 156* 157*  K 3.5   < > 3.9  --  4.3  --  4.5  --   --  4.3  --   --  4.3  CL 123*  --  129*  --  >130*  --  >130*  --   --  128*  --   --  130*  CO2 16*  --  18*  --  18*  --  21*  --   --  21*  --   --  24  GLUCOSE 127*  --  115*  --  128*  --  117*  --   --  140*  --   --  134*  BUN 14  --  17  --  20  --  25*  --   --  30*  --   --  28*  CREATININE 1.48*  --  1.64*  --  1.54*  --  1.51*  --   --  1.52*  --   --  1.75*  CALCIUM 8.6*  --  8.8*  --  9.3  --  9.0  --   --  9.1  --   --  9.0  MG 2.0  --   --   --   --   --   --   --   --   --   --   --   --   PHOS 3.3  --   --   --   --   --   --   --   --   --   --   --   --    < > = values in this interval not displayed.   GFR: Estimated Creatinine Clearance: 39.4 mL/min (A) (by C-G formula based on SCr of 1.75 mg/dL (H)). Recent Labs  Lab 05/19/21 1033 05/20/21 0356 05/21/21 0402 05/21/21 2239  WBC 23.1* 20.9* 20.6* 19.1*    Liver Function Tests: No results for input(s): AST, ALT, ALKPHOS, BILITOT, PROT, ALBUMIN in the last 168 hours.  No results for input(s): AMMONIA in the last 168 hours.  ABG    Component Value Date/Time   PHART 7.333 (L) 05/17/2021 1817   PCO2ART 33.7 05/17/2021 1817   PO2ART 156 (H) 05/17/2021 1817   HCO3 17.6 (L) 05/17/2021 1817   TCO2 19 (L) 05/17/2021 1817   ACIDBASEDEF 7.0 (H) 05/17/2021 1817   O2SAT 99.0 05/17/2021 1817     HbA1C: Hgb A1c MFr Bld  Date/Time Value Ref Range Status  05/16/2021 04:57 AM 5.4 4.8 - 5.6 % Final    Comment:    (NOTE)         Prediabetes: 5.7 - 6.4         Diabetes: >6.4         Glycemic control for adults with diabetes: <7.0     CBG: Recent Labs  Lab 05/21/21 1512 05/21/21 1914 05/21/21 2305 05/22/21 0316 05/22/21 0720  GLUCAP 119* 121* 124* 132* 109*     CRITICAL CARE Performed by: Mick Sell   Total critical care time: 35 minutes   JD Geryl Rankins [facility] 05/22/2021, 7:26 AM  Please see [email or web address] for pager details.  From 7A-7P if no response, please call [phone]. After hours, please call ELink [phone].

## 2021-05-22 NOTE — Consult Note (Signed)
OB/GYN Consult Note  Referring Provider: Dr. Rosalin Hawking  Holly Bradley is a 45 y.o. admitted for right MCA stroke. OB/Gyn consulted for ongoing vaginal bleeding. Patient seen in office 03/08/21 for vaginal bleeding secondary to enlarged fibroid uterus and prescribed megace 40 mg BID for bleeding. Had BP >200s at that visit and urged to present to hospital for hypertensive emergency. Advised she was not a surgical candidate due to CHF, SVT.  Patient admitted 05/15/21 with altered mental status and subsequently diagnosed with right M1/MCA occlusion. Underwent mechanical thrombectomy and branch vessel embolization thereafter. Subsequently found to have a SAH, respiratory distress and was intubated. Started on megace for ongoing vaginal bleeding with acute blood loss anemia requiring transfusion of 2 units pRBCs.      Past Medical History:  Diagnosis Date  . Hypertension     Past Surgical History:  Procedure Laterality Date  . IR ANGIOGRAM FOLLOW UP STUDY  05/15/2021  . IR CT HEAD LTD  05/15/2021  . IR PERCUTANEOUS ART THROMBECTOMY/INFUSION INTRACRANIAL INC DIAG ANGIO  05/15/2021      . IR PERCUTANEOUS ART THROMBECTOMY/INFUSION INTRACRANIAL INC DIAG ANGIO  05/15/2021  . IR TRANSCATH/EMBOLIZ  05/15/2021  . RADIOLOGY WITH ANESTHESIA N/A 05/15/2021   Procedure: IR WITH ANESTHESIA;  Surgeon: Radiologist, Medication, MD;  Location: [facility];  Service: Radiology;  Laterality: N/A;    OB History  No obstetric history on file.    Social History   Socioeconomic History  . Marital status: Single    Spouse name: Not on file  . Number of children: Not on file  . Years of education: Not on file  . Highest education level: Not on file  Occupational History  . Not on file  Tobacco Use  . Smoking status: Current Every Day Smoker  . Smokeless tobacco: Never Used  Substance and Sexual Activity  . Alcohol use: Not on file  . Drug use: Not on file  . Sexual activity: Not on file  Other Topics  Concern  . Not on file  Social History Narrative  . Not on file   Social Determinants of Health   Financial Resource Strain: Not on file  Food Insecurity: Not on file  Transportation Needs: Not on file  Physical Activity: Not on file  Stress: Not on file  Social Connections: Not on file    History reviewed. No pertinent family history.  Medications Prior to Admission  Medication Sig Dispense Refill Last Dose  . amiodarone (PACERONE) 200 MG tablet Take 200 mg by mouth daily.     Marland Kitchen ENTRESTO 97-103 MG Take 1 tablet by mouth 2 (two) times daily.     . FEROSUL 325 (65 Fe) MG tablet Take 325 mg by mouth 2 (two) times daily.     . hydrALAZINE (APRESOLINE) 100 MG tablet Take 100 mg by mouth 3 (three) times daily.     . isosorbide mononitrate (IMDUR) 60 MG 24 hr tablet Take 60 mg by mouth daily.     Marland Kitchen KLOR-CON M20 20 MEQ tablet Take 40 mEq by mouth 2 (two) times daily.     . megestrol (MEGACE) 20 MG tablet Take 40 mg by mouth in the morning and at bedtime. May increase to 4 tablets ( 80 mg) twice daily in the event of heavy breathing     . metoprolol (TOPROL-XL) 200 MG 24 hr tablet Take 200 mg by mouth daily.     Marland Kitchen torsemide (DEMADEX) 20 MG tablet Take 40 mg by mouth 2 (two)  times daily.       No Known Allergies  Review of Systems: Negative except for what is mentioned in HPI.     Physical Exam: BP (!) 164/79   Pulse 82   Temp 99.1 F (37.3 C) (Axillary)   Resp 16   Ht 5\' 5"  (1.651 m)   Wt 71.6 kg   LMP  (LMP Unknown) Comment: pt intubated  SpO2 100%   BMI 26.27 kg/m  CONSTITUTIONAL: intubated patient with mild reaction to stimulus SKIN: Edematous NEUROLGIC: unable to assess 2/2 sedation  PSYCHIATRIC: unable to assess RESPIRATORY: intubated ABDOMEN: Soft, distended 2/2 presumed enlarged uterus GU: Urine wick examined, noted to have some dried brown blood on it, no blood on pad underneath PELVIC: deferred MUSCULOSKELETAL: deferred  GUc exam done with RN chaperone  present.  Pertinent Labs/Studies:   Results for orders placed or performed during the hospital encounter of 05/15/21 (from the past 72 hour(s))  Sodium     Status: Abnormal   Collection Time: 05/19/21  4:01 PM  Result Value Ref Range   Sodium 157 (H) 135 - 145 mmol/L    Comment: Performed at [facility], [place] [address] [place], [place] [address]  Glucose, capillary     Status: Abnormal   Collection Time: 05/19/21  7:15 PM  Result Value Ref Range   Glucose-Capillary 107 (H) 70 - 99 mg/dL    Comment: Glucose reference range applies only to samples taken after fasting for at least 8 hours.  Sodium     Status: Abnormal   Collection Time: 05/19/21 10:50 PM  Result Value Ref Range   Sodium 158 (H) 135 - 145 mmol/L    Comment: Performed at [facility], [place] [address] [place], [place] [address]  Glucose, capillary     Status: Abnormal   Collection Time: 05/19/21 11:06 PM  Result Value Ref Range   Glucose-Capillary 137 (H) 70 - 99 mg/dL    Comment: Glucose reference range applies only to samples taken after fasting for at least 8 hours.  Glucose, capillary     Status: Abnormal   Collection Time: 05/20/21  3:15 AM  Result Value Ref Range   Glucose-Capillary 113 (H) 70 - 99 mg/dL    Comment: Glucose reference range applies only to samples taken after fasting for at least 8 hours.  CBC     Status: Abnormal   Collection Time: 05/20/21  3:56 AM  Result Value Ref Range   WBC 20.9 (H) 4.0 - 10.5 K/uL   RBC 3.12 (L) 3.87 - 5.11 MIL/uL   Hemoglobin 6.5 (LL) 12.0 - 15.0 g/dL    Comment: REPEATED TO VERIFY Reticulocyte Hemoglobin testing may be clinically indicated, consider ordering this additional test [id number] THIS CRITICAL RESULT HAS VERIFIED AND BEEN CALLED TO EMILY BROOME,RN BY ZELDA BEECH ON 05 29 2022 AT 0423, AND HAS BEEN READ BACK.     HCT 23.1 (L) 36.0 - 46.0 %   MCV 74.0 (L) 80.0 - 100.0 fL   MCH 20.8 (L) 26.0 - 34.0 pg   MCHC 28.1 (L) 30.0 - 36.0 g/dL   RDW 19.7  (H) 11.5 - 15.5 %   Platelets 233 150 - 400 K/uL    Comment: REPEATED TO VERIFY   nRBC 0.1 0.0 - 0.2 %    Comment: Performed at [facility] [address] [place], [place] [address]  Basic metabolic panel     Status: Abnormal   Collection Time: 05/20/21  3:56 AM  Result Value Ref Range   Sodium 156 (H) 135 - 145 mmol/L   Potassium 4.5 3.5 - 5.1 mmol/L   Chloride >130 (HH) 98 - 111 mmol/L    Comment: CRITICAL RESULT CALLED TO, READ BACK BY AND VERIFIED WITH: L Dylewsky,RN 05/20/2021 0540 WILDERK    CO2 21 (L) 22 - 32 mmol/L   Glucose, Bld 117 (H) 70 - 99 mg/dL    Comment: Glucose reference range applies only to samples taken after fasting for at least 8 hours.   BUN 25 (H) 6 - 20 mg/dL   Creatinine, Ser 1.51 (H) 0.44 - 1.00 mg/dL   Calcium 9.0 8.9 - 10.3 mg/dL   GFR, Estimated 43 (L) >60 mL/min    Comment: (NOTE) Calculated using the CKD-EPI Creatinine Equation (2021)    Anion gap NOT CALCULATED 5 - 15    Comment: Performed at [facility] [address], [place], [place] [address]  Prepare RBC (crossmatch)     Status: None   Collection Time: 05/20/21  5:10 AM  Result Value Ref Range   Order Confirmation      ORDER PROCESSED BY BLOOD BANK Performed at [facility], [address] [place], [place] [address]   Type and screen [place]     Status: None (Preliminary result)   Collection Time: 05/20/21  6:40 AM  Result Value Ref Range   ABO/RH(D) O POS    Antibody Screen NEG    Sample Expiration 05/23/2021,2359    Unit Number [id number]    Blood Component Type RED CELLS,LR    Unit division 00    Status of Unit ISSUED    Donor AG Type NEGATIVE FOR E ANTIGEN    Transfusion Status OK TO TRANSFUSE    Crossmatch Result COMPATIBLE    Unit Number [id number]    Blood Component Type RED CELLS,LR    Unit division 00    Status of Unit ISSUED,FINAL    Donor AG Type NEGATIVE FOR E ANTIGEN    Transfusion Status OK TO TRANSFUSE     Crossmatch Result COMPATIBLE   Glucose, capillary     Status: Abnormal   Collection Time: 05/20/21  7:34 AM  Result Value Ref Range   Glucose-Capillary 117 (H) 70 - 99 mg/dL    Comment: Glucose reference range applies only to samples taken after fasting for at least 8 hours.   Comment 1 Notify RN    Comment 2 Document in Chart   Sodium     Status: Abnormal   Collection Time: 05/20/21 10:01 AM  Result Value Ref Range   Sodium 157 (H) 135 - 145 mmol/L    Comment: Performed at [facility], [place] [address] [place], [place] [address]  Glucose, capillary     Status: Abnormal   Collection Time: 05/20/21 11:09 AM  Result Value Ref Range   Glucose-Capillary 118 (H) 70 - 99 mg/dL    Comment: Glucose reference range applies only to samples taken after fasting for at least 8 hours.   Comment 1 Notify RN    Comment 2 Document in Chart   Glucose, capillary     Status: Abnormal   Collection Time: 05/20/21  3:18 PM  Result Value Ref Range   Glucose-Capillary 129 (H) 70 - 99 mg/dL    Comment: Glucose reference range applies only to samples taken after fasting for at least 8 hours.   Comment 1 Notify RN  Comment 2 Document in Chart   Sodium     Status: Abnormal   Collection Time: 05/20/21  4:33 PM  Result Value Ref Range   Sodium 156 (H) 135 - 145 mmol/L    Comment: Performed at [facility], [address], [place], [place] [address]  Hemoglobin and hematocrit, blood     Status: Abnormal   Collection Time: 05/20/21  4:33 PM  Result Value Ref Range   Hemoglobin 7.0 (L) 12.0 - 15.0 g/dL   HCT 24.3 (L) 36.0 - 46.0 %    Comment: Performed at [facility], [place] [address], [place], [place] [address]  Glucose, capillary     Status: Abnormal   Collection Time: 05/20/21  7:14 PM  Result Value Ref Range   Glucose-Capillary 116 (H) 70 - 99 mg/dL    Comment: Glucose reference range applies only to samples taken after fasting for at least 8 hours.  Sodium     Status: Abnormal    Collection Time: 05/20/21  9:35 PM  Result Value Ref Range   Sodium 156 (H) 135 - 145 mmol/L    Comment: Performed at [facility], [place] [address] [place], [place] [address]  Glucose, capillary     Status: Abnormal   Collection Time: 05/20/21 11:02 PM  Result Value Ref Range   Glucose-Capillary 120 (H) 70 - 99 mg/dL    Comment: Glucose reference range applies only to samples taken after fasting for at least 8 hours.  Prepare RBC (crossmatch)     Status: None   Collection Time: 05/20/21 11:40 PM  Result Value Ref Range   Order Confirmation      ORDER PROCESSED BY BLOOD BANK Performed at [facility], [place] [address], [place], [place] [address]   Glucose, capillary     Status: Abnormal   Collection Time: 05/21/21  3:06 AM  Result Value Ref Range   Glucose-Capillary 112 (H) 70 - 99 mg/dL    Comment: Glucose reference range applies only to samples taken after fasting for at least 8 hours.  Triglycerides     Status: None   Collection Time: 05/21/21  4:02 AM  Result Value Ref Range   Triglycerides 37 <150 mg/dL    Comment: Performed at [facility] [address], [place], [place] [address]  CBC     Status: Abnormal   Collection Time: 05/21/21  4:02 AM  Result Value Ref Range   WBC 20.6 (H) 4.0 - 10.5 K/uL   RBC 3.42 (L) 3.87 - 5.11 MIL/uL   Hemoglobin 7.2 (L) 12.0 - 15.0 g/dL    Comment: Reticulocyte Hemoglobin testing may be clinically indicated, consider ordering this additional test [id number]    HCT 24.8 (L) 36.0 - 46.0 %   MCV 72.5 (L) 80.0 - 100.0 fL   MCH 21.1 (L) 26.0 - 34.0 pg   MCHC 29.0 (L) 30.0 - 36.0 g/dL   RDW 20.2 (H) 11.5 - 15.5 %   Platelets 234 150 - 400 K/uL    Comment: REPEATED TO VERIFY   nRBC 0.2 0.0 - 0.2 %    Comment: Performed at [facility], [place] [address] [place], [place] [address]  Basic metabolic panel     Status: Abnormal   Collection Time: 05/21/21  4:02 AM  Result Value Ref Range   Sodium 155 (H) 135 - 145 mmol/L    Potassium 4.3 3.5 - 5.1 mmol/L   Chloride 128 (H)  98 - 111 mmol/L   CO2 21 (L) 22 - 32 mmol/L   Glucose, Bld 140 (H) 70 - 99 mg/dL    Comment: Glucose reference range applies only to samples taken after fasting for at least 8 hours.   BUN 30 (H) 6 - 20 mg/dL   Creatinine, Ser 1.52 (H) 0.44 - 1.00 mg/dL   Calcium 9.1 8.9 - 10.3 mg/dL   GFR, Estimated 42 (L) >60 mL/min    Comment: (NOTE) Calculated using the CKD-EPI Creatinine Equation (2021)    Anion gap 6 5 - 15    Comment: Performed at [facility] [address], [place], [place] [address]  Glucose, capillary     Status: Abnormal   Collection Time: 05/21/21  7:19 AM  Result Value Ref Range   Glucose-Capillary 107 (H) 70 - 99 mg/dL    Comment: Glucose reference range applies only to samples taken after fasting for at least 8 hours.  Sodium     Status: Abnormal   Collection Time: 05/21/21 10:32 AM  Result Value Ref Range   Sodium 156 (H) 135 - 145 mmol/L    Comment: Performed at [facility] [address] [place], [place] [address]  Culture, blood (Routine X 2) w Reflex to ID Panel     Status: None (Preliminary result)   Collection Time: 05/21/21 11:03 AM   Specimen: BLOOD  Result Value Ref Range   Specimen Description BLOOD BLOOD RIGHT HAND    Special Requests      BOTTLES DRAWN AEROBIC ONLY Blood Culture adequate volume   Culture  Setup Time      GRAM POSITIVE COCCI IN CLUSTERS AEROBIC BOTTLE ONLY CRITICAL VALUE NOTED.  VALUE IS CONSISTENT WITH PREVIOUSLY REPORTED AND CALLED VALUE. Performed at [facility], [place] [address], [place], [place] [address]    Culture GRAM POSITIVE COCCI    Report Status PENDING   Glucose, capillary     Status: Abnormal   Collection Time: 05/21/21 11:07 AM  Result Value Ref Range   Glucose-Capillary 139 (H) 70 - 99 mg/dL    Comment: Glucose reference range applies only to samples taken after fasting for at least 8 hours.  Culture, blood (Routine X 2) w Reflex to ID Panel      Status: None (Preliminary result)   Collection Time: 05/21/21 11:09 AM   Specimen: BLOOD  Result Value Ref Range   Specimen Description BLOOD BLOOD RIGHT HAND    Special Requests      BOTTLES DRAWN AEROBIC ONLY Blood Culture results may not be optimal due to an inadequate volume of blood received in culture bottles   Culture  Setup Time      GRAM POSITIVE COCCI IN CLUSTERS AEROBIC BOTTLE ONLY CRITICAL RESULT CALLED TO, READ BACK BY AND VERIFIED WITH: PHARM D M.Berline Lopes ON [id number] AT 1050 BY E.PARRISH Performed at [facility], [place] [address], [place], [place] [address]    Culture GRAM POSITIVE COCCI    Report Status PENDING   Blood Culture ID Panel (Reflexed)     Status: Abnormal   Collection Time: 05/21/21 11:09 AM  Result Value Ref Range   Enterococcus faecalis NOT DETECTED NOT DETECTED   Enterococcus Faecium NOT DETECTED NOT DETECTED   Listeria monocytogenes NOT DETECTED NOT DETECTED   Staphylococcus species DETECTED (A) NOT DETECTED    Comment: CRITICAL RESULT CALLED TO, READ BACK BY AND VERIFIED WITH: PHARM D M.TUCKER ON [id number] AT 1050 BY E.PARRISH  Staphylococcus aureus (BCID) NOT DETECTED NOT DETECTED   Staphylococcus epidermidis DETECTED (A) NOT DETECTED    Comment: Methicillin (oxacillin) resistant coagulase negative staphylococcus. Possible blood culture contaminant (unless isolated from more than one blood culture draw or clinical case suggests pathogenicity). No antibiotic treatment is indicated for blood  culture contaminants. CRITICAL RESULT CALLED TO, READ BACK BY AND VERIFIED WITH: PHARM D M.TUCKER ON [id number] AT 1050 BY E.PARRISH    Staphylococcus lugdunensis NOT DETECTED NOT DETECTED   Streptococcus species NOT DETECTED NOT DETECTED   Streptococcus agalactiae NOT DETECTED NOT DETECTED   Streptococcus pneumoniae NOT DETECTED NOT DETECTED   Streptococcus pyogenes NOT DETECTED NOT DETECTED   A.calcoaceticus-baumannii NOT DETECTED NOT DETECTED    Bacteroides fragilis NOT DETECTED NOT DETECTED   Enterobacterales NOT DETECTED NOT DETECTED   Enterobacter cloacae complex NOT DETECTED NOT DETECTED   Escherichia coli NOT DETECTED NOT DETECTED   Klebsiella aerogenes NOT DETECTED NOT DETECTED   Klebsiella oxytoca NOT DETECTED NOT DETECTED   Klebsiella pneumoniae NOT DETECTED NOT DETECTED   Proteus species NOT DETECTED NOT DETECTED   Salmonella species NOT DETECTED NOT DETECTED   Serratia marcescens NOT DETECTED NOT DETECTED   Haemophilus influenzae NOT DETECTED NOT DETECTED   Neisseria meningitidis NOT DETECTED NOT DETECTED   Pseudomonas aeruginosa NOT DETECTED NOT DETECTED   Stenotrophomonas maltophilia NOT DETECTED NOT DETECTED   Candida albicans NOT DETECTED NOT DETECTED   Candida auris NOT DETECTED NOT DETECTED   Candida glabrata NOT DETECTED NOT DETECTED   Candida krusei NOT DETECTED NOT DETECTED   Candida parapsilosis NOT DETECTED NOT DETECTED   Candida tropicalis NOT DETECTED NOT DETECTED   Cryptococcus neoformans/gattii NOT DETECTED NOT DETECTED   Methicillin resistance mecA/C DETECTED (A) NOT DETECTED    Comment: CRITICAL RESULT CALLED TO, READ BACK BY AND VERIFIED WITH: PHARM D M.Berline Lopes ON [id number] AT 1050 BY E.PARRISH Performed at [facility], [place] [address], [place], [place] [address]   Glucose, capillary     Status: Abnormal   Collection Time: 05/21/21  3:12 PM  Result Value Ref Range   Glucose-Capillary 119 (H) 70 - 99 mg/dL    Comment: Glucose reference range applies only to samples taken after fasting for at least 8 hours.  Sodium     Status: Abnormal   Collection Time: 05/21/21  4:11 PM  Result Value Ref Range   Sodium 156 (H) 135 - 145 mmol/L    Comment: Performed at [facility] [address] [place], [place] [address]  Glucose, capillary     Status: Abnormal   Collection Time: 05/21/21  7:14 PM  Result Value Ref Range   Glucose-Capillary 121 (H) 70 - 99 mg/dL    Comment: Glucose reference  range applies only to samples taken after fasting for at least 8 hours.  CBC     Status: Abnormal   Collection Time: 05/21/21 10:39 PM  Result Value Ref Range   WBC 19.1 (H) 4.0 - 10.5 K/uL   RBC 3.24 (L) 3.87 - 5.11 MIL/uL   Hemoglobin 6.7 (LL) 12.0 - 15.0 g/dL    Comment: REPEATED TO VERIFY Reticulocyte Hemoglobin testing may be clinically indicated, consider ordering this additional test [id number] THIS CRITICAL RESULT HAS VERIFIED AND BEEN CALLED TO BROOKE LANIER,RN BY AJA HUGHES ON 05 30 2022 AT 2313, AND HAS BEEN READ BACK.     HCT 23.3 (L) 36.0 - 46.0 %   MCV 71.9 (L) 80.0 - 100.0 fL   MCH 20.7 (L)  26.0 - 34.0 pg   MCHC 28.8 (L) 30.0 - 36.0 g/dL   RDW 20.4 (H) 11.5 - 15.5 %   Platelets 263 150 - 400 K/uL   nRBC 0.3 (H) 0.0 - 0.2 %    Comment: Performed at [facility] [address], [place], [place] [address]  Basic metabolic panel     Status: Abnormal   Collection Time: 05/21/21 10:39 PM  Result Value Ref Range   Sodium 157 (H) 135 - 145 mmol/L   Potassium 4.3 3.5 - 5.1 mmol/L   Chloride 130 (H) 98 - 111 mmol/L   CO2 24 22 - 32 mmol/L   Glucose, Bld 134 (H) 70 - 99 mg/dL    Comment: Glucose reference range applies only to samples taken after fasting for at least 8 hours.   BUN 28 (H) 6 - 20 mg/dL   Creatinine, Ser 1.75 (H) 0.44 - 1.00 mg/dL   Calcium 9.0 8.9 - 10.3 mg/dL   GFR, Estimated 36 (L) >60 mL/min    Comment: (NOTE) Calculated using the CKD-EPI Creatinine Equation (2021)    Anion gap 3 (L) 5 - 15    Comment: Performed at [facility] [address], [place], [place] [address]  Vitamin B12     Status: None   Collection Time: 05/21/21 10:39 PM  Result Value Ref Range   Vitamin B-12 481 180 - 914 pg/mL    Comment: (NOTE) This assay is not validated for testing neonatal or myeloproliferative syndrome specimens for Vitamin B12 levels. Performed at [facility], [place] [address] [place], [place] [address]   Iron and TIBC     Status:  Abnormal   Collection Time: 05/21/21 10:39 PM  Result Value Ref Range   Iron 60 28 - 170 ug/dL   TIBC 197 (L) 250 - 450 ug/dL   Saturation Ratios 30 10.4 - 31.8 %   UIBC 137 ug/dL    Comment: Performed at [facility], [place] [address], [place], [place] [address]  Ferritin     Status: None   Collection Time: 05/21/21 10:39 PM  Result Value Ref Range   Ferritin 162 11 - 307 ng/mL    Comment: Performed at [facility], [place] [address], [place], [place] [address]  Glucose, capillary     Status: Abnormal   Collection Time: 05/21/21 11:05 PM  Result Value Ref Range   Glucose-Capillary 124 (H) 70 - 99 mg/dL    Comment: Glucose reference range applies only to samples taken after fasting for at least 8 hours.  Glucose, capillary     Status: Abnormal   Collection Time: 05/22/21  3:16 AM  Result Value Ref Range   Glucose-Capillary 132 (H) 70 - 99 mg/dL    Comment: Glucose reference range applies only to samples taken after fasting for at least 8 hours.  Folate     Status: None   Collection Time: 05/22/21  6:07 AM  Result Value Ref Range   Folate 10.9 >5.9 ng/mL    Comment: Performed at [facility], [place] [address] [place], [place] [address]  Reticulocytes     Status: Abnormal   Collection Time: 05/22/21  6:07 AM  Result Value Ref Range   Retic Ct Pct <0.4 (L) 0.4 - 3.1 %    Comment: REPEATED TO VERIFY   RBC. 3.84 (L) 3.87 - 5.11 MIL/uL   Retic Count, Absolute 10.8 (L) 19.0 - 186.0 K/uL  Immature Retic Fract 9.2 2.3 - 15.9 %    Comment: Performed at [facility], [place] [address] [place], [place] [address]  Sodium     Status: Abnormal   Collection Time: 05/22/21  6:07 AM  Result Value Ref Range   Sodium 156 (H) 135 - 145 mmol/L    Comment: Performed at [facility] [address] [place], [place] [address]  Hemoglobin and hematocrit, blood     Status: Abnormal   Collection Time: 05/22/21  6:07 AM  Result Value Ref Range   Hemoglobin 8.5 (L) 12.0 - 15.0  g/dL    Comment: REPEATED TO VERIFY POST TRANSFUSION SPECIMEN    HCT 28.5 (L) 36.0 - 46.0 %    Comment: Performed at [facility] [address], [place], [place] [address]  Glucose, capillary     Status: Abnormal   Collection Time: 05/22/21  7:20 AM  Result Value Ref Range   Glucose-Capillary 109 (H) 70 - 99 mg/dL    Comment: Glucose reference range applies only to samples taken after fasting for at least 8 hours.  Glucose, capillary     Status: Abnormal   Collection Time: 05/22/21 11:24 AM  Result Value Ref Range   Glucose-Capillary 120 (H) 70 - 99 mg/dL    Comment: Glucose reference range applies only to samples taken after fasting for at least 8 hours.  Glucose, capillary     Status: Abnormal   Collection Time: 05/22/21  3:23 PM  Result Value Ref Range   Glucose-Capillary 129 (H) 70 - 99 mg/dL    Comment: Glucose reference range applies only to samples taken after fasting for at least 8 hours.       Assessment and Plan :Havana Baldwin is a 45 y.o.  Admitted for right M1/MCA occlusion, SAH, respiratory distress, acute blood loss anemia. Gyn consulted for vaginal bleeding in the setting of anemia.  I viewed the patient's wicking pad with RN, noted to have some dried brown blood on it however, same wick in place since 6 am and no blood on pad underneath. Some red cells in pure wick canister, but still clear through. Difficult to assess amount of bleeding, however would not anticipate this bleeding to be cause for a significant amount of blood loss. RN not able to estimate if bleeding has improved since starting megace 80 mg BID. Patient has enlarged soft tissue mass, presumably uterine in origin based on CT. She would benefit from an ultrasound and uterine sampling as an outpatient. Unfortunately, she is a poor surgical candidate at this point, and we would not offer any surgical intervention due to co-morbidities.   Would recommend increasing megace to 80 mg TID to see if  menstrual blood loss can further be decreased. If no improvement, will consider alternate therapies.    Thank you for this consult, we will follow along.   For OB/GYN questions, please call the [facility] [place] Monday - Friday, 8 am - 5 pm: [phone] All other times: [phone]    K. Arvilla Meres, M.D. Attending [place], [facility] for [employer], [place]

## 2021-05-22 NOTE — Progress Notes (Signed)
PHARMACY - PHYSICIAN COMMUNICATION CRITICAL VALUE ALERT - BLOOD CULTURE IDENTIFICATION (BCID)  Holly Bradley is an 44 y.o. female who presented to [facility] on 05/15/2021 with a chief complaint of altered mental status, found to have R MCA M1 occlusion  Assessment:  1/2 (aerobic bottles) with Staph epi w/ MecA Resistance - likely a contaminant  Name of physician (or Provider) Contacted: Mercy Riding, PharmD - will communicate with Dr. Lynetta Mare on rounds  Current antibiotics: Ancef 2g IV q8 hours  Changes to prescribed antibiotics recommended:  N/A- continue current regimen for Staph Aureus in Trach Aspirate   Results for orders placed or performed during the hospital encounter of 05/15/21  Blood Culture ID Panel (Reflexed) (Collected: 05/21/2021 11:09 AM)  Result Value Ref Range   Enterococcus faecalis NOT DETECTED NOT DETECTED   Enterococcus Faecium NOT DETECTED NOT DETECTED   Listeria monocytogenes NOT DETECTED NOT DETECTED   Staphylococcus species DETECTED (A) NOT DETECTED   Staphylococcus aureus (BCID) NOT DETECTED NOT DETECTED   Staphylococcus epidermidis DETECTED (A) NOT DETECTED   Staphylococcus lugdunensis NOT DETECTED NOT DETECTED   Streptococcus species NOT DETECTED NOT DETECTED   Streptococcus agalactiae NOT DETECTED NOT DETECTED   Streptococcus pneumoniae NOT DETECTED NOT DETECTED   Streptococcus pyogenes NOT DETECTED NOT DETECTED   A.calcoaceticus-baumannii NOT DETECTED NOT DETECTED   Bacteroides fragilis NOT DETECTED NOT DETECTED   Enterobacterales NOT DETECTED NOT DETECTED   Enterobacter cloacae complex NOT DETECTED NOT DETECTED   Escherichia coli NOT DETECTED NOT DETECTED   Klebsiella aerogenes NOT DETECTED NOT DETECTED   Klebsiella oxytoca NOT DETECTED NOT DETECTED   Klebsiella pneumoniae NOT DETECTED NOT DETECTED   Proteus species NOT DETECTED NOT DETECTED   Salmonella species NOT DETECTED NOT DETECTED   Serratia marcescens NOT DETECTED NOT DETECTED    Haemophilus influenzae NOT DETECTED NOT DETECTED   Neisseria meningitidis NOT DETECTED NOT DETECTED   Pseudomonas aeruginosa NOT DETECTED NOT DETECTED   Stenotrophomonas maltophilia NOT DETECTED NOT DETECTED   Candida albicans NOT DETECTED NOT DETECTED   Candida auris NOT DETECTED NOT DETECTED   Candida glabrata NOT DETECTED NOT DETECTED   Candida krusei NOT DETECTED NOT DETECTED   Candida parapsilosis NOT DETECTED NOT DETECTED   Candida tropicalis NOT DETECTED NOT DETECTED   Cryptococcus neoformans/gattii NOT DETECTED NOT DETECTED   Methicillin resistance mecA/C DETECTED (A) NOT DETECTED    Holly Bradley  Holly Bradley 05/22/2021  11:22 AM

## 2021-05-22 NOTE — Progress Notes (Signed)
STROKE TEAM PROGRESS NOTE   INTERVAL HISTORY RN is at the bedside. Pt still intubated, on vent, eyes closed, not open with voice, but able to follow commands on the right foot and hand, and rigorously moving right leg with stimulation. Still has vaginal bleeding and Hb last night 6.7, received again PRBC transfusion. Pt off precedex now, not candidate for extubation given mental status and weak gag, considering trach. Updated brother [name] over the phone, and he requested to talk to Dr. Lynetta Mare CCM. I also placed consult for OBGYN for her large fibroids.    Vitals:   05/22/21 1400 05/22/21 1430 05/22/21 1500 05/22/21 1520  BP: (!) 152/77 (!) 157/76 (!) 162/77 (!) 164/79  Pulse: 83 84 82   Resp: (!) 0 18 (!) 8 16  Temp:      TempSrc:      SpO2: 100% 100% 100% 100%  Weight:      Height:       CBC:  Recent Labs  Lab 05/21/21 0402 05/21/21 2239 05/22/21 0607  WBC 20.6* 19.1*  --   HGB 7.2* 6.7* 8.5*  HCT 24.8* 23.3* 28.5*  MCV 72.5* 71.9*  --   PLT 234 263  --    Basic Metabolic Panel:  Recent Labs  Lab 05/17/21 0415 05/17/21 1023 05/21/21 0402 05/21/21 1032 05/21/21 2239 05/22/21 0607  NA 150*   < > 155*   < > 157* 156*  K 3.5   < > 4.3  --  4.3  --   CL 123*   < > 128*  --  130*  --   CO2 16*   < > 21*  --  24  --   GLUCOSE 127*   < > 140*  --  134*  --   BUN 14   < > 30*  --  28*  --   CREATININE 1.48*   < > 1.52*  --  1.75*  --   CALCIUM 8.6*   < > 9.1  --  9.0  --   MG 2.0  --   --   --   --   --   PHOS 3.3  --   --   --   --   --    < > = values in this interval not displayed.   Lipid Panel:  Recent Labs  Lab 05/16/21 0457 05/18/21 0127 05/21/21 0402  CHOL 131  --   --   TRIG 178*   < > 37  HDL 19*  --   --   CHOLHDL 6.9  --   --   VLDL 36  --   --   LDLCALC 76  --   --    < > = values in this interval not displayed.   HgbA1c:  Recent Labs  Lab 05/16/21 0457  HGBA1C 5.4   Urine Drug Screen:  No results for input(s): LABOPIA, COCAINSCRNUR,  LABBENZ, AMPHETMU, THCU, LABBARB in the last 168 hours.  Alcohol Level  No results for input(s): ETH in the last 168 hours.  IMAGING  CT HEAD WO CONTRAST Result Date: 05/16/2021  IMPRESSION:  1. Continued interval evolution of moderately large right MCA distribution infarct. Associated regional mass effect has mildly increased, with new 2 mm of right-to-left midline shift. No hydrocephalus or trapping.  2. Decreased prominence of subarachnoid hemorrhage at the posterior right Sylvian fissure and overlying right frontoparietal region. Trace intraventricular hemorrhage related to redistribution.  3. Question new area of parenchymal hypodensity  involving the lateral left cerebellum. While this finding is favored to be artifactual, a possible new acute ischemic infarct is difficult to exclude, and could be considered in the correct clinical setting. Attention at follow-up recommended.  4. No other new acute intracranial abnormality.   CT HEAD WO CONTRAST Result Date: 05/15/2021  IMPRESSION: Small-to-moderate volume hyperdensity within the right sylvian fissure and along portions of the right frontoparietal and temporal lobes compatible with extravasated contrast/subarachnoid hemorrhage. There is hyperdensity within the right basal ganglia, right insula, lateral right frontal lobe/frontal operculum and within portions of the right parietal lobe likely reflecting contrast staining at sites of acute infarction. Mild associated mass effect with subtle partial effacement of the right lateral ventricle. No midline shift or hydrocephalus. Embolization material now overlies the lateral right parietal lobe. Stable background parenchymal atrophy and chronic small vessel ischemic disease. Redemonstrated chronic infarct within the superior left cerebellum.   MR BRAIN WO CONTRAST Result Date: 05/15/2021 IMPRESSION:  1. Moderately large acute right MCA infarct. Associated petechial hemorrhage, subarachnoid  hemorrhage, and minimal intraventricular hemorrhage.  2. Severe chronic small vessel ischemic disease with multiple chronic infarcts.   MR CERVICAL SPINE WO CONTRAST Result Date: 05/15/2021  IMPRESSION: The cord itself appears normal. No disc disease. No spinal stenosis. 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 No significant encroachment upon the neural spaces. Some prevertebral soft tissue edema. In this clinical scenario, this may be subsequent to previous endotracheal intubation. One could not rule out pharyngitis or cellulitis but that seems less likely. I do not see any traumatic injury to the cervical spine itself. Electronically Signed   By: Nelson Chimes M.D.   On: 05/15/2021 16:05   IR CT Head Ltd Result Date: 05/15/2021 CT of the head was obtained and post processed in a separate workstation with concurrent attending physician supervision. Selected images were sent to PACS. Small right sylvian contrast extravasation confirmed. Additionally, hyperdensity of the right basal ganglia and lateral right frontal cortex are noted, related to ongoing ischemia. Right internal carotid artery angiograms with frontal and lateral views of the head showed occlusion of the embolized branch with no evidence of contrast extravasation. Patency of the remaining MCA branches noted.  IR PERCUTANEOUS ART THROMBECTOMY/INFUSION INTRACRANIAL INC DIAG ANGIO Result Date: 05/15/2021 IMPRESSION: Mechanical thrombectomy performed for treatment of right M1/MCA occlusion with clot fragmentation and embolization to same distal territory requiring multiple additional passes. Procedure complicated by contrast extravasation at a distal right M3/MCA branch requiring vessel embolization. Near complete recanalization of the right MCA vascular tree was achieved (TICI 2b). PLAN: - Bed rest post femoral access x6h - Follow-up CT in 4 hours to evaluate for SAH/contrast extravasation stability - Transfer to ICU- SBP: 120-140  mmHg   Echo Result read: 05/16/2021 1. Left ventricular ejection fraction, by estimation, is 55 to 60%. The  left ventricle has normal function. The left ventricle has no regional  wall motion abnormalities. There is moderate concentric left ventricular  hypertrophy. Left ventricular  diastolic parameters are consistent with Grade II diastolic dysfunction  (pseudonormalization). Elevated left atrial pressure.  2. Right ventricular systolic function is normal. The right ventricular  size is normal. Mildly increased right ventricular wall thickness. There  is moderately elevated pulmonary artery systolic pressure. The estimated  right ventricular systolic pressure  is 54.0 mmHg.  3. Left atrial size was severely dilated.  4. Right atrial size was severely dilated.  5. There is no evidence of cardiac tamponade.  6. The mitral  valve is normal in structure. Trivial mitral valve  regurgitation. No evidence of mitral stenosis.  7. The aortic valve is normal in structure. There is mild calcification  of the aortic valve. There is mild thickening of the aortic valve. Aortic  valve regurgitation is not visualized. Mild aortic valve sclerosis is  present, with no evidence of aortic  valve stenosis.  8. The inferior vena cava is dilated in size with <50% respiratory  variability, suggesting right atrial pressure of 15 mmHg.   PHYSICAL EXAM  Temp:  [98.2 F (36.8 C)-100.3 F (37.9 C)] 99.1 F (37.3 C) (05/31 1200) Pulse Rate:  [82-102] 82 (05/31 1500) Resp:  [0-25] 16 (05/31 1520) BP: (132-175)/(56-80) 164/79 (05/31 1520) SpO2:  [100 %] 100 % (05/31 1520) FiO2 (%):  [40 %] 40 % (05/31 1520) Weight:  [71.6 kg] 71.6 kg (05/31 0500)  General - Well nourished, well developed, intubated off sedation.  Ophthalmologic - fundi not visualized due to noncooperation.  Cardiovascular - Regular rhythm and rate  Neuro - intubated off sedation, eyes closed, not open with voice stimulation,  able to follow commands on the right hand and foot, however, slow in response on the right hand. With forced eye opening, eyes in right forced gaze preference, not blinking to visual threat on the left, not tracking, PERRL. B/l eyelids mildly swollen. Corneal reflex present, gag and cough present. Breathing over the vent.  Facial symmetry not able to test due to ET tube.  Tongue protrusion not cooperative. On pain stimulation, left UE and LE flaccid. Right UE proximal 0/5 and distal finger movement 2+/5 and RLE rigorously movement in bed with stimulation. No babinski. Sensation, coordination not cooperative and gait not tested.   ASSESSMENT/PLAN [name] is a 45 y.o. female with history of uncontrolled HTN, tobacco use disorder, large uterine fibroids, heart failure, paroxysmal SVT, paranoid psychosis who presents with altered mental status after being found down.  CT head initially read as small acute appearing perforator infarct in the R BG and notable for severe small vessel ischemia. However concerning for potential R MCA stroke and thus CTA head and neck and CTP was obtained with a R MCA M1 occlusion and a core of 50cc and 39ml of penumbra.   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   CTA neck: LVO right M1 origin. Left vertebral occlusion at its origin. High-grade narrowings of the right cavernous ICA.  CT perfusion suggestive 50 cc core infarct and 50 cc of adjacent penumbra.  MRI: large acute right MCA infarct. Associated petechial hemorrhage, subarachnoid hemorrhage, and minimal intraventricular hemorrhage.  CT 5/28 stable right MCA infarct with MLS  CT 5/30 stable right MCA infarct and MLS 71mm  2D Echo 55 to 60%  Further cardioembolic work up once pt condition stabilized   UDS:  negative  LDL 76  HgbA1c 5.4  UDS neg  Sickle cell screen - neg, ANA pending  VTE prophylaxis - SCDs  No antithrombotic prior to admission, no antithrombotics now due to severe vaginal bleeding. Once Hb stable, will consider ASA.  Therapy recommendations:  CIR  Disposition:  Pending  Cytotoxic cerebral edema  On 3% saline @ 30 -> FW 100 Q4  Na 157->156->157->155->157  Na goal 150-160  Check sodium Q6  CT head 5/30 stable right MCA infarcts  and MLS 79mm  OK to gradually normalize Na level  Respiratory failure  Intubated 5/26 for increased work of breathing with altered mental status   Management per CCM, appreciated  Switched to precedex -> now off   Not candidate for extubation  May need trach per CCM  Brother has questions with trach - Dr Lynetta Mare is going to discuss with brother  Acute blood loss anemia Large uterine fibroids  hgb 7.8->5.9->PRBC->7.6->6.5->PRBC->7.2->6.7->PRBC->8.5  PRBC tranfusion x 3  extensive virginal bleeding from extremely large abdominopelvic mass measuring up to 22.9 cm noted on CT 5/26  Has been followed with Dr. Jonna Clark - poor surgical candidate due to uncontrolled HTN and SVT  Abdominal distention on exam  Was on Megace 40 bid but can increase to 80mg  bid if heavy bleeding PTA per Dr. Elgie Congo  On megace 80mg  bid  OBGYN Dr. Rosana Hoes consulted, appreciate help  Dysphagia  Likely due to stroke  SLP following  cortrak placement  On TF @ 50 and FW  Paroxysmal SVT history Hx of CHF  Followed with cardiology   EF 40-45% in 04/2020 and 20-25% in 12/2020  This admission EF 55-60%  on amiodarone 200mg  daily and isosorbide 20 tid  Currently heart rate controlled  Hypertension  Home meds:  Hydralazine 100mg  TID, Imdur 60mg  daily, Toprol XL 200mg  daily  Off Cleviprex now  Amlodipine 10mg  daily -> 5mg  bid  clonidine 0.2 tid -> Q8h  Hydralazine 100mg  tid -> q8h  imdur 20mg  tid -> Q8h  Metoprolol 100mg   bid  SBP goal to <160 mmHg  Labetalol prn . Long-term BP goal normotensive  Hyperlipidemia  Home meds:  none  LDL 76, goal < 70  Add atorvastatin 20mg  daily  Continue statin at discharge  Tobacco abuse  Current smoker  Smoking cessation counseling will be provided  Fever and Leukocytosis  WBC 12.7->14.0->23.1->20.9->20.6->19.1  Tmax 101.9->100.1  Sputum culture - staph Aures   Blood culture G+ cocci in clusters  CXR LLL atx vs. pneumonia  CCM on board  Other Stroke Risk Factors  Coronary artery disease  Other Active Problems  AKI: creatinine 1.27->1.48->1.54->1.51->1.52->1.75   05/22/2021 3:41 PM  This patient is critically ill due to bacteremia, pneumonia, right MCA stroke, hemorrhagic conversion, severe anemia, fever and leukocytosis and at significant risk of neurological worsening, death form cerebral edema, brain herniation, recurrent stroke, hypertensive encephalopathy, sepsis, heart failure, and seizure. This patient's care requires constant monitoring of vital signs, hemodynamics, respiratory and cardiac monitoring, review of multiple databases, neurological assessment, discussion with family, other specialists and medical decision making of high complexity. I spent 40 minutes of neurocritical care time in the care of this patient.  I discussed with Dr. Lynetta Mare CCM and Dr. Rosana Hoes OB/GYN. I had long discussion with brother [name] over the phone, updated pt current condition, treatment plan and potential prognosis, and answered all the questions.  He expressed understanding and appreciation.    Rosalin Hawking, MD PhD Stroke Neurology 05/22/2021 3:41 PM     To contact Stroke Continuity provider, please refer to [email or web address] After hours, contact General Neurology

## 2021-05-22 NOTE — Progress Notes (Signed)
Inpatient Rehabilitation Admissions Coordinator  We will sign off at this time. Please re consult when appropriate.  Danne Baxter, RN, MSN Rehab Admissions Coordinator [phone] 05/22/2021 11:54 AM

## 2021-05-23 ENCOUNTER — Encounter (HOSPITAL_COMMUNITY): Payer: BLUE CROSS/BLUE SHIELD

## 2021-05-23 DIAGNOSIS — R7881 Bacteremia: Secondary | ICD-10-CM

## 2021-05-23 DIAGNOSIS — I5022 Chronic systolic (congestive) heart failure: Secondary | ICD-10-CM

## 2021-05-23 DIAGNOSIS — Z515 Encounter for palliative care: Secondary | ICD-10-CM

## 2021-05-23 DIAGNOSIS — Z7189 Other specified counseling: Secondary | ICD-10-CM

## 2021-05-23 DIAGNOSIS — N939 Abnormal uterine and vaginal bleeding, unspecified: Secondary | ICD-10-CM

## 2021-05-23 DIAGNOSIS — D259 Leiomyoma of uterus, unspecified: Secondary | ICD-10-CM | POA: Diagnosis not present

## 2021-05-23 DIAGNOSIS — I639 Cerebral infarction, unspecified: Secondary | ICD-10-CM | POA: Diagnosis not present

## 2021-05-23 DIAGNOSIS — D649 Anemia, unspecified: Secondary | ICD-10-CM | POA: Diagnosis not present

## 2021-05-23 DIAGNOSIS — I63511 Cerebral infarction due to unspecified occlusion or stenosis of right middle cerebral artery: Secondary | ICD-10-CM | POA: Diagnosis not present

## 2021-05-23 DIAGNOSIS — R509 Fever, unspecified: Secondary | ICD-10-CM | POA: Diagnosis not present

## 2021-05-23 LAB — BPAM RBC
Blood Product Expiration Date: 202206122359
Blood Product Expiration Date: 202206302359
ISSUE DATE / TIME: 202205290920
ISSUE DATE / TIME: 202205310001
Unit Type and Rh: 5100
Unit Type and Rh: 5100

## 2021-05-23 LAB — TYPE AND SCREEN
ABO/RH(D): O POS
Antibody Screen: NEGATIVE
Donor AG Type: NEGATIVE
Donor AG Type: NEGATIVE
Unit division: 0
Unit division: 0

## 2021-05-23 LAB — BASIC METABOLIC PANEL
Anion gap: 10 (ref 5–15)
BUN: 24 mg/dL — ABNORMAL HIGH (ref 6–20)
CO2: 24 mmol/L (ref 22–32)
Calcium: 9.3 mg/dL (ref 8.9–10.3)
Chloride: 118 mmol/L — ABNORMAL HIGH (ref 98–111)
Creatinine, Ser: 1.39 mg/dL — ABNORMAL HIGH (ref 0.44–1.00)
GFR, Estimated: 47 mL/min — ABNORMAL LOW (ref >60)
Glucose, Bld: 126 mg/dL — ABNORMAL HIGH (ref 70–99)
Potassium: 4.1 mmol/L (ref 3.5–5.1)
Sodium: 152 mmol/L — ABNORMAL HIGH (ref 135–145)

## 2021-05-23 LAB — CBC
HCT: 26.2 % — ABNORMAL LOW (ref 36.0–46.0)
Hemoglobin: 7.7 g/dL — ABNORMAL LOW (ref 12.0–15.0)
MCH: 21.8 pg — ABNORMAL LOW (ref 26.0–34.0)
MCHC: 29.4 g/dL — ABNORMAL LOW (ref 30.0–36.0)
MCV: 74 fL — ABNORMAL LOW (ref 80.0–100.0)
Platelets: 257 10*3/uL (ref 150–400)
RBC: 3.54 MIL/uL — ABNORMAL LOW (ref 3.87–5.11)
RDW: 22 % — ABNORMAL HIGH (ref 11.5–15.5)
WBC: 16.8 10*3/uL — ABNORMAL HIGH (ref 4.0–10.5)
nRBC: 0.4 % — ABNORMAL HIGH (ref 0.0–0.2)

## 2021-05-23 LAB — SODIUM
Sodium: 153 mmol/L — ABNORMAL HIGH (ref 135–145)
Sodium: 152 mmol/L — ABNORMAL HIGH (ref 135–145)
Sodium: 152 mmol/L — ABNORMAL HIGH (ref 135–145)

## 2021-05-23 LAB — ANTINUCLEAR ANTIBODIES, IFA: ANA Ab, IFA: NEGATIVE

## 2021-05-23 LAB — GLUCOSE, CAPILLARY
Glucose-Capillary: 141 mg/dL — ABNORMAL HIGH (ref 70–99)
Glucose-Capillary: 131 mg/dL — ABNORMAL HIGH (ref 70–99)
Glucose-Capillary: 137 mg/dL — ABNORMAL HIGH (ref 70–99)
Glucose-Capillary: 124 mg/dL — ABNORMAL HIGH (ref 70–99)
Glucose-Capillary: 125 mg/dL — ABNORMAL HIGH (ref 70–99)
Glucose-Capillary: 131 mg/dL — ABNORMAL HIGH (ref 70–99)

## 2021-05-23 MED ORDER — MEGESTROL ACETATE 40 MG PO TABS
80.0000 mg | ORAL_TABLET | Freq: Three times a day (TID) | ORAL | Status: DC
  Administered 2021-05-23 – 2021-05-31 (×24): 80 mg
  Filled 2021-05-23 (×26): qty 2

## 2021-05-23 MED ORDER — MODAFINIL 100 MG PO TABS
200.0000 mg | ORAL_TABLET | Freq: Every morning | ORAL | Status: DC
  Administered 2021-05-23 – 2021-05-30 (×8): 200 mg
  Filled 2021-05-23 (×9): qty 2

## 2021-05-23 MED ORDER — ASPIRIN 81 MG PO CHEW
81.0000 mg | CHEWABLE_TABLET | Freq: Every day | ORAL | Status: DC
  Administered 2021-05-23 – 2021-06-21 (×30): 81 mg
  Filled 2021-05-23 (×30): qty 1

## 2021-05-23 NOTE — Progress Notes (Signed)
SLP Cancellation Note  Patient Details Name: Holly Bradley MRN: [id number] DOB: 06/12/1976   Cancelled treatment:        Remains intubated. Will follow.   [name] 05/23/2021, 9:55 AM   [name].Ed Risk analyst [phone] Office [phone]

## 2021-05-23 NOTE — Progress Notes (Signed)
STROKE TEAM PROGRESS NOTE   INTERVAL HISTORY RN is at the bedside. Pt still intubated, not good candidate for extubation. Dr. Lynetta Mare and me discussed with brother [name] yesterday about trach and he needs time to think about it. Dr. Rosana Hoes saw her yesterday and today for large fibroids and increased megace to 80mg  tid. Not surgical candidate at this time.     Vitals:   05/23/21 1000 05/23/21 1100 05/23/21 1154 05/23/21 1200  BP: 117/62 140/71 140/71   Pulse: 91 86 87   Resp: 20 (!) 7 (!) 21   Temp:    100.2 F (37.9 C)  TempSrc:    Axillary  SpO2: 100% 100% 100%   Weight:      Height:       CBC:  Recent Labs  Lab 05/21/21 2239 05/22/21 0607 05/23/21 0435  WBC 19.1*  --  16.8*  HGB 6.7* 8.5* 7.7*  HCT 23.3* 28.5* 26.2*  MCV 71.9*  --  74.0*  PLT 263  --  979   Basic Metabolic Panel:  Recent Labs  Lab 05/17/21 0415 05/17/21 1023 05/21/21 2239 05/22/21 0607 05/23/21 0435 05/23/21 0953  NA 150*   < > 157*   < > 152* 153*  K 3.5   < > 4.3  --  4.1  --   CL 123*   < > 130*  --  118*  --   CO2 16*   < > 24  --  24  --   GLUCOSE 127*   < > 134*  --  126*  --   BUN 14   < > 28*  --  24*  --   CREATININE 1.48*   < > 1.75*  --  1.39*  --   CALCIUM 8.6*   < > 9.0  --  9.3  --   MG 2.0  --   --   --   --   --   PHOS 3.3  --   --   --   --   --    < > = values in this interval not displayed.   Lipid Panel:  Recent Labs  Lab 05/21/21 0402  TRIG 37   HgbA1c:  No results for input(s): HGBA1C in the last 168 hours. Urine Drug Screen:  No results for input(s): LABOPIA, COCAINSCRNUR, LABBENZ, AMPHETMU, THCU, LABBARB in the last 168 hours.  Alcohol Level  No results for input(s): ETH in the last 168 hours.  IMAGING  CT HEAD WO CONTRAST Result Date: 05/16/2021  IMPRESSION:  1. Continued interval evolution of moderately large right MCA distribution infarct. Associated regional mass effect has mildly increased, with new 2 mm of right-to-left midline shift. No hydrocephalus  or trapping.  2. Decreased prominence of subarachnoid hemorrhage at the posterior right Sylvian fissure and overlying right frontoparietal region. Trace intraventricular hemorrhage related to redistribution.  3. Question new area of parenchymal hypodensity involving the lateral left cerebellum. While this finding is favored to be artifactual, a possible new acute ischemic infarct is difficult to exclude, and could be considered in the correct clinical setting. Attention at follow-up recommended.  4. No other new acute intracranial abnormality.   CT HEAD WO CONTRAST Result Date: 05/15/2021  IMPRESSION: Small-to-moderate volume hyperdensity within the right sylvian fissure and along portions of the right frontoparietal and temporal lobes compatible with extravasated contrast/subarachnoid hemorrhage. There is hyperdensity within the right basal ganglia, right insula, lateral right frontal lobe/frontal operculum and within portions of the right parietal  lobe likely reflecting contrast staining at sites of acute infarction. Mild associated mass effect with subtle partial effacement of the right lateral ventricle. No midline shift or hydrocephalus. Embolization material now overlies the lateral right parietal lobe. Stable background parenchymal atrophy and chronic small vessel ischemic disease. Redemonstrated chronic infarct within the superior left cerebellum.   MR BRAIN WO CONTRAST Result Date: 05/15/2021 IMPRESSION:  1. Moderately large acute right MCA infarct. Associated petechial hemorrhage, subarachnoid hemorrhage, and minimal intraventricular hemorrhage.  2. Severe chronic small vessel ischemic disease with multiple chronic infarcts.   MR CERVICAL SPINE WO CONTRAST Result Date: 05/15/2021  IMPRESSION: The cord itself appears normal. No disc disease. No spinal stenosis. 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 No significant encroachment upon the neural spaces. Some prevertebral soft  tissue edema. In this clinical scenario, this may be subsequent to previous endotracheal intubation. One could not rule out pharyngitis or cellulitis but that seems less likely. I do not see any traumatic injury to the cervical spine itself. Electronically Signed   By: Nelson Chimes M.D.   On: 05/15/2021 16:05   IR CT Head Ltd Result Date: 05/15/2021 CT of the head was obtained and post processed in a separate workstation with concurrent attending physician supervision. Selected images were sent to PACS. Small right sylvian contrast extravasation confirmed. Additionally, hyperdensity of the right basal ganglia and lateral right frontal cortex are noted, related to ongoing ischemia. Right internal carotid artery angiograms with frontal and lateral views of the head showed occlusion of the embolized branch with no evidence of contrast extravasation. Patency of the remaining MCA branches noted.  IR PERCUTANEOUS ART THROMBECTOMY/INFUSION INTRACRANIAL INC DIAG ANGIO Result Date: 05/15/2021 IMPRESSION: Mechanical thrombectomy performed for treatment of right M1/MCA occlusion with clot fragmentation and embolization to same distal territory requiring multiple additional passes. Procedure complicated by contrast extravasation at a distal right M3/MCA branch requiring vessel embolization. Near complete recanalization of the right MCA vascular tree was achieved (TICI 2b). PLAN: - Bed rest post femoral access x6h - Follow-up CT in 4 hours to evaluate for SAH/contrast extravasation stability - Transfer to ICU- SBP: 120-140 mmHg   Echo Result read: 05/16/2021 1. Left ventricular ejection fraction, by estimation, is 55 to 60%. The  left ventricle has normal function. The left ventricle has no regional  wall motion abnormalities. There is moderate concentric left ventricular  hypertrophy. Left ventricular  diastolic parameters are consistent with Grade II diastolic dysfunction  (pseudonormalization). Elevated left  atrial pressure.  2. Right ventricular systolic function is normal. The right ventricular  size is normal. Mildly increased right ventricular wall thickness. There  is moderately elevated pulmonary artery systolic pressure. The estimated  right ventricular systolic pressure  is 57.3 mmHg.  3. Left atrial size was severely dilated.  4. Right atrial size was severely dilated.  5. There is no evidence of cardiac tamponade.  6. The mitral valve is normal in structure. Trivial mitral valve  regurgitation. No evidence of mitral stenosis.  7. The aortic valve is normal in structure. There is mild calcification  of the aortic valve. There is mild thickening of the aortic valve. Aortic  valve regurgitation is not visualized. Mild aortic valve sclerosis is  present, with no evidence of aortic  valve stenosis.  8. The inferior vena cava is dilated in size with <50% respiratory  variability, suggesting right atrial pressure of 15 mmHg.   PHYSICAL EXAM  Temp:  [99 F (37.2 C)-100.2 F (37.9 C)] 100.2  F (37.9 C) (06/01 1200) Pulse Rate:  [75-100] 87 (06/01 1154) Resp:  [0-24] 21 (06/01 1154) BP: (117-164)/(60-87) 140/71 (06/01 1154) SpO2:  [100 %] 100 % (06/01 1154) FiO2 (%):  [40 %] 40 % (06/01 1154) Weight:  [70.6 kg] 70.6 kg (06/01 0500)  General - Well nourished, well developed, intubated off sedation.  Ophthalmologic - fundi not visualized due to noncooperation.  Cardiovascular - Regular rhythm and rate  Neuro - intubated off sedation, eyes closed, not open with voice stimulation, able to follow some commands on the right hand and foot, however, slow in response on the right hand. With forced eye opening, eyes in right forced gaze preference, not blinking to visual threat on the left, not tracking, PERRL. B/l eyelids mildly swollen. Corneal reflex present, gag and cough present. Breathing over the vent.  Facial symmetry not able to test due to ET tube.  Tongue protrusion not  cooperative. On pain stimulation, left UE and LE flaccid. Right UE proximal 0/5 and distal finger movement 2+/5 and RLE rigorously movement in bed with stimulation. No babinski. Sensation, coordination not cooperative and gait not tested.   ASSESSMENT/PLAN [name] is a 45 y.o. female with history of uncontrolled HTN, tobacco use disorder, large uterine fibroids, heart failure, paroxysmal SVT, paranoid psychosis who presents with altered mental status after being found down.  CT head initially read as small acute appearing perforator infarct in the R BG and notable for severe small vessel ischemia. However concerning for potential R MCA stroke and thus CTA head and neck and CTP was obtained with a R MCA M1 occlusion and a core of 50cc and 65ml of penumbra.   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   CTA neck: LVO right M1 origin. Left vertebral occlusion at its origin. High-grade narrowings of the right cavernous ICA.  CT perfusion suggestive 50 cc core infarct and 50 cc of adjacent penumbra.  MRI: large acute right MCA infarct. Associated petechial hemorrhage, subarachnoid hemorrhage, and minimal intraventricular hemorrhage.  CT 5/28 stable right MCA infarct with MLS  CT 5/30 stable right MCA infarct and MLS 44mm  2D Echo 55 to 60%  Further cardioembolic work up once pt neuro improves  UDS: negative  LDL 76  HgbA1c 5.4  UDS neg  Sickle cell screen - neg, ANA neg  VTE prophylaxis - SCDs  No antithrombotic prior to admission, now on ASA 81.  Therapy recommendations:  CIR  Disposition:  Pending  Cytotoxic cerebral edema  On 3% saline @ 30 -> FW 100 Q4  Na 157->156->157->155->157->153  Na goal 150-160  CT head 5/30 stable right MCA infarcts and MLS 53mm  OK to  gradually normalize Na level  Respiratory failure  Intubated 5/26 for increased work of breathing with altered mental status   Management per CCM, appreciated  Switched to precedex -> now off   Not candidate for extubation  May need trach per CCM  Brother needs time to decide on trach  Acute blood loss anemia Large uterine fibroids  hgb 7.8->5.9->PRBC->7.6->6.5->PRBC->7.2->6.7->PRBC->8.5->7.7  PRBC tranfusion x 3  extensive virginal bleeding from extremely large abdominopelvic mass measuring up to 22.9 cm noted on CT 5/26  Has been followed with Dr. Jonna Clark - poor surgical candidate due to uncontrolled HTN and SVT  Abdominal distention on  exam  Increase Megace to megace 80mg  tid  OBGYN Dr. Rosana Hoes consulted, appreciate help - not surgical candidate  Dysphagia  Likely due to stroke  SLP following  cortrak placement  On TF @ 50 and FW  Paroxysmal SVT history Hx of CHF  Followed with cardiology   EF 40-45% in 04/2020 and 20-25% in 12/2020  This admission EF 55-60%  on amiodarone 200mg  daily and isosorbide 20 tid  Currently heart rate controlled  Hypertension  Home meds:  Hydralazine 100mg  TID, Imdur 60mg  daily, Toprol XL 200mg  daily  Off Cleviprex now  Amlodipine 10mg  daily -> 5mg  bid  clonidine 0.2 tid -> Q8h  Hydralazine 100mg  tid -> q8h  imdur 20mg  tid -> Q8h  Metoprolol 100mg  bid  SBP goal to <160 mmHg  Labetalol prn . Long-term BP goal normotensive  Hyperlipidemia  Home meds:  none  LDL 76, goal < 70  Add atorvastatin 20mg  daily  Continue statin at discharge  Tobacco abuse  Current smoker  Smoking cessation counseling will be provided  Fever and Leukocytosis  WBC 12.7->14.0->23.1->20.9->20.6->19.1->16.8  Tmax 101.9->100.1  Sputum culture - staph Aures   Blood culture G+ cocci in clusters  CXR LLL atx vs. pneumonia  CCM on board  On ancef   Other Stroke Risk Factors  Coronary artery disease  Other Active  Problems  AKI: creatinine 1.27->1.48->1.54->1.51->1.52->1.75->1.39  Lethargic - added provigil by CCM   hospital day #8  This patient is critically ill due to bacteremia, right MCA stroke, severe anemia, hemorrhagic conversion, fever and leukocytosis and at significant risk of neurological worsening, death form cerebral edema, severe anemia, respiratory failure, spesis. This patient's care requires constant monitoring of vital signs, hemodynamics, respiratory and cardiac monitoring, review of multiple databases, neurological assessment, discussion with family, other specialists and medical decision making of high complexity. I spent 35 minutes of neurocritical care time in the care of this patient. I discussed with Dr. Lynetta Mare.    Rosalin Hawking, MD PhD Stroke Neurology 05/23/2021 12:25 PM     To contact Stroke Continuity provider, please refer to [email or web address] After hours, contact General Neurology

## 2021-05-23 NOTE — Progress Notes (Signed)
    Gynecology Progress Note  Admission Date: 05/15/2021 Current Date: 12/29/5100 5:85 PM  Holly Bradley is a 45 y.o. HD#9 admitted for right M1/MCA occlusion, s/p thrombectomy, coil embolization with subsequent SAH, altered mental status and respiratory distress and subsequent intubation. Remains intubated  History complicated by: Patient Active Problem List   Diagnosis Date Noted  . Stroke (cerebrum) ([place]) 05/15/2021    Subjective:  Patient seen at bedside with critical care team. Periwick examined, again dark brown blood on wick, no blood on pad underneath. No appreciable change in mental status.   Objective:   Vitals:   05/23/21 1000 05/23/21 1100 05/23/21 1154 05/23/21 1200  BP: 117/62 140/71 140/71   Pulse: 91 86 87   Resp: 20 (!) 7 (!) 21   Temp:    100.2 F (37.9 C)  TempSrc:    Axillary  SpO2: 100% 100% 100%   Weight:      Height:        Total I/O In: 230 [Other:30; NG/GT:200] Out: -   Intake/Output Summary (Last 24 hours) at 05/23/2021 1337 Last data filed at 05/23/2021 0900 Gross per 24 hour  Intake 1830 ml  Output 1400 ml  Net 430 ml     Physical exam: BP 140/71   Pulse 87   Temp 100.2 F (37.9 C) (Axillary)   Resp (!) 21   Ht 5\' 5"  (1.651 m)   Wt 70.6 kg   LMP  (LMP Unknown) Comment: pt intubated  SpO2 100%   BMI 25.90 kg/m  CONSTITUTIONAL: intubated patient SKIN: Edematous NEUROLGIC: unable to assess 2/2 sedation  PSYCHIATRIC: unable to assess RESPIRATORY: intubated ABDOMEN: Soft, distended 2/2 presumed enlarged uterus GU: small amount dried brown blood on periwick, no blood on pad underneath, no bright red blood noted PELVIC: deferred MUSCULOSKELETAL: deferred  Labs  Recent Labs  Lab 05/21/21 0402 05/21/21 2239 05/22/21 0607 05/23/21 0435  WBC 20.6* 19.1*  --  16.8*  HGB 7.2* 6.7* 8.5* 7.7*  HCT 24.8* 23.3* 28.5* 26.2*  PLT 234 263  --  257     Assessment & Plan:   Patient is 45 y.o. female admitted for right M1/MCA  occlusion, s/p thrombectomy and embolization, subsequent SAH and respiratory distress who remains intubated. Significant medical history including SVT, HTN and CHF.  Some brown blood on periwick pad, no significant bleeding noted. Recommend megace increase to 80 mg TID to see if bleeding can be further decreased. If no improvement or bleeding worsens, will recommend alternate therapies. Unfortunately, patient is not a surgical candidate and further management for presumed enlarged uterus will depend on medical status.  Thank you for this consult, will sign off at this point. Please call or re-consult with any questions/issues  For OB/GYN issues, please call the [facility] [place] Monday - Friday, 8 am - 5 pm: [phone] All other times: [phone]    K. Arvilla Meres, MD, [place] [facility])

## 2021-05-23 NOTE — Progress Notes (Signed)
Physical Therapy Treatment Patient Details Name: Holly Bradley MRN: [id number] DOB: [birth year] Today's Date: 05/23/2021    History of Present Illness 45 yo female s/p mechanical thrombectomy on 5/24 of right MCA M1/M2/M3 and embolization of right MCA M3 branch due to contrast extravasation. Pt re-intubated 5/26. Repeat head CT on 5/26 showed increased cytotoxic edema and mildly increased leftward midline shift, now 5 mm, decreased SAH and IVH. Abdominal CT showing 22.9 cm mass, possibly consistent with uterine fibroids, no lymphadenopathy. PMH fibriods HTN HF paranoid psychosis recent hospitalizations january and february 2022    PT Comments    Pt keeping eyes closed, but following right sided commands. No diaphoresis noted this session and improved BP with stimulation and upright positioning (148/70). Performed PROM/AAROM to all extremities. Noted L shoulder subluxation; provided elevation and donned resting hand splint. Will continue to promote arousal, strengthening/ROM, and mobility as tolerated.    Follow Up Recommendations  LTACH;SNF     Equipment Recommendations  Wheelchair (measurements PT);Wheelchair cushion (measurements PT);Hospital bed;Other (comment) (hoyer lift)    Recommendations for Other Services       Precautions / Restrictions Precautions Precautions: Fall;Other (comment) Precaution Comments: L hemiparesis, PRAFO, hand splint, intubated, SBP < 160, R mitten Restrictions Weight Bearing Restrictions: No    Mobility  Bed Mobility Overal bed mobility: Needs Assistance             General bed mobility comments: TotalA for repositioning    Transfers                    Ambulation/Gait                 Stairs             Wheelchair Mobility    Modified Rankin (Stroke Patients Only) Modified Rankin (Stroke Patients Only) Pre-Morbid Rankin Score: No symptoms Modified Rankin: Severe disability     Balance                                             Cognition Arousal/Alertness: Lethargic Behavior During Therapy: Restless Overall Cognitive Status: Difficult to assess                                 General Comments: Pt following right sided commands i.e. wiggle toes, squeezing hands, giving thumbs up. Keeping eyes closed, played music for stimulation/engagement      Exercises General Exercises - Upper Extremity Shoulder Flexion: AAROM;Right;10 reps;Supine Digit Composite Flexion: PROM;Both;10 reps;Supine General Exercises - Lower Extremity Ankle Circles/Pumps: PROM;AROM;Both;10 reps;Supine Heel Slides: PROM;AAROM;Both;10 reps;Supine Hip ABduction/ADduction: PROM;Left;10 reps;Supine Other Exercises Other Exercises: PROM LUE D1/D2    General Comments        Pertinent Vitals/Pain Pain Assessment: Faces Faces Pain Scale: Hurts a little bit Pain Location: generalized with noxious stimuli Pain Descriptors / Indicators: Grimacing Pain Intervention(s): Monitored during session    Home Living                      Prior Function            PT Goals (current goals can now be found in the care plan section) Acute Rehab PT Goals Patient Stated Goal: none stated PT Goal Formulation: Patient unable to participate in goal setting Time For Goal Achievement: 06/03/21 Potential  to Achieve Goals: Fair    Frequency    Min 3X/week      PT Plan Frequency needs to be updated    Co-evaluation              AM-PAC PT "6 Clicks" Mobility   Outcome Measure  Help needed turning from your back to your side while in a flat bed without using bedrails?: Total Help needed moving from lying on your back to sitting on the side of a flat bed without using bedrails?: Total Help needed moving to and from a bed to a chair (including a wheelchair)?: Total Help needed standing up from a chair using your arms (e.g., wheelchair or bedside chair)?: Total Help needed to walk  in hospital room?: Total Help needed climbing 3-5 steps with a railing? : Total 6 Click Score: 6    End of Session Equipment Utilized During Treatment: Oxygen Activity Tolerance: Patient limited by lethargy Patient left: in bed;with call bell/phone within reach;with bed alarm set;with restraints reapplied;with SCD's reapplied Nurse Communication: Mobility status PT Visit Diagnosis: Hemiplegia and hemiparesis;Other abnormalities of gait and mobility (R26.89);Unsteadiness on feet (R26.81);Muscle weakness (generalized) (M62.81);Difficulty in walking, not elsewhere classified (R26.2);Other symptoms and signs involving the nervous system (R29.898);Apraxia (R48.2) Hemiplegia - Right/Left: Left Hemiplegia - dominant/non-dominant: Non-dominant Hemiplegia - caused by: Cerebral infarction;Nontraumatic SAH;Nontraumatic intracerebral hemorrhage     Time: 5625-6389 PT Time Calculation (min) (ACUTE ONLY): 21 min  Charges:  $Therapeutic Exercise: 8-22 mins                     Holly Bradley, PT, DPT Acute Rehabilitation Services Pager [phone] Office [phone]    Deno Etienne 05/23/2021, 9:25 AM

## 2021-05-23 NOTE — Progress Notes (Signed)
NAME:  Starling Jessie, MRN:  [id number], DOB:  1976/11/15, LOS: 8  ADMISSION DATE:  05/15/2021, CONSULTATION DATE:  05/23/21 REFERRING MD:  Leonie Man, CHIEF COMPLAINT:  Respiratory failure   History of Present Illness:  Ms. Kwiecinski is a [name] with PMH of HTN, tobacco use, CHF, DMT2 (controlled with diet and exercise), pSVT, uterine fibroids, paranoid psychosis who presented with AMS on 5/24.   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  On follow up imaging, she had stable appearing SAH and continued R basal ganglia infarct with 45mm R to L shift.   She was admitted to the Neuro ICU and treating with hypertonic saline, statin and BP control.  On 5/26, PCCM consulted when pt became acutely less responsive with respiratory distress along with abdominal distension worse than baseline, per patient.  She was intubated and repeat head CT, abd/pelvis CT ordered.  Repeat head CT on 5/26 showed increased cytotoxic edema and mildly increased leftward midline shift, now 5 mm, decreased SAH and IVH.  Abdominal CT showing 22.9 cm mass, possibly consistent with uterine fibroids, no lymphadenopathy.      Pertinent  Medical History  -CHF -tobacco use -DMT2 -HTN -pSVT -uterine fibroids -paranoid psychosis   Significant Hospital Events: Including procedures, antibiotic start and stop dates in addition to other pertinent events   . 5/24 Admit to neurology after ED visit with AMS, found to have R MCA stroke, underwent mechanical thrombectomy, M3 coil embolization, subsequent SAH . 5/26 decreased mental status and respiratory distress, intubated   Interim History / Subjective:   OBGYN recommends to increase Megace to 80 TID due to bleeding Remains on vent: PRVC; failed SBT this morning due to tachypnea Neuro status improving  today: able to wiggle right toes to command and raise RLE to pain. Can raise RUE to command. Able to open eyes sluggishly to commands. Cough/gag reflex intact.   Objective   Blood pressure (!) 147/66, pulse 88, temperature 99.6 F (37.6 C), temperature source Axillary, resp. rate (!) 21, height 5\' 5"  (1.651 m), weight 70.6 kg, SpO2 100 %.    Vent Mode: PRVC FiO2 (%):  [40 %] 40 % Set Rate:  [20 bmp] 20 bmp Vt Set:  [460 mL] 460 mL PEEP:  [5 cmH20] 5 cmH20 Pressure Support:  [5 cmH20-8 cmH20] 8 cmH20 Plateau Pressure:  [13 cmH20-20 cmH20] 14 cmH20   Intake/Output Summary (Last 24 hours) at 05/23/2021 0710 Last data filed at 05/23/2021 0700 Gross per 24 hour  Intake 2109.96 ml  Output 1400 ml  Net 709.96 ml   Filed Weights   05/21/21 0432 05/22/21 0500 05/23/21 0500  Weight: 75.5 kg 71.6 kg 70.6 kg    General:  Critically ill intubated patient. HEENT: MM pink/moist; ETT in place. Rightward gaze in eyes Neuro: able to wiggle right toes to command and raise RLE to pain. Can raise RUE to command. Able to open eyes sluggishly to commands. Cough/gag reflex intact. Pupil 54mm and reactive to light; corneal reflex sluggish.  CV: s1s2, no m/r/g PULM: Dim/clear BS bilaterally; On mech vent on PRVC GI: palpable firm uterine fibroid, bsx4 active Extremities: warm/dry, no edema    Labs/imaging that I havepersonally reviewed  (right click and "Reselect all SmartList Selections" daily)   Na 152 trending down BUN 24, creatinine 1.39 trending down WBC 16.8 trending down Hgb  7.7 from 8.5  Abundant Staphylococcus aureus in tracheal aspirate.  MRSA negative by PCR.  CT Head 5/30: Stable R MCA infarct. 5 mm R to L midline shift (no significant change).   Assessment & Plan:   Critically ill due to acute large MCA stroke with subarachnoid hemorrhage and midline shift requiring mechanical ventilation for airway protection and hyperosmolar therapy for cerebral edema Plan: -Continue free water  100 Q4 to allow Na to drop slowly to normal -Off sedation since 5/30; Neuro checks Q4 -Antithrombotics on hold due to vaginal bleeding and low Hgb; will start ASA 81 mg daily -Palliative following -SBP goal <160 -Provigil started -Trend BMP  Critically ill due to acute hypoxic respiratory failure requiring mechanical ventilation Staph aureus pneumonia Plan: -Daily SBT; not a candidate for extubation due to mental status/inability to protect airway and failed SBT today due to tachypnea -Pulm Hygiene -Trend Fever/CBC -Continue Cefazolin X 7days (stop date 6/6)  Critically ill due to hypertension requiring titration of IV antihypertensive. Hx of HTN Plan: -SBP goal <160 -Continue enteral Amlodipine, clonidine, hydralazine, metoprolol  Iron deficiency anemia likely due to uterine fibroids Plan: -IV iron -transfuse for hgb < 7 -OBGYN consulted following: recommended increased Megace to 80 TID -Poor surgical candidate due to multiple co-morbidities  AKI (Creatinine/BUN trending down) Plan: -Avoid nephrotoxic agents; renal dose meds -Trend BMP -Continue free water  PSVT hx Hx of CHF (EF 20-25% 12/2020) Plan: - Continue Amio and Isosorbide - Continuous Telemetry monitoring  Hx of HTN, HLD Plan: -Continue statin -Continue enteral antihypertensives    Best practice (right click and "Reselect all SmartList Selections" daily)  Diet:  Tube Feed , Pain/Anxiety/Delirium protocol (if indicated): Yes (RASS goal 0) Fentanyl prn but not getting due to mental status VAP protocol (if indicated): Yes DVT prophylaxis: SCD GI prophylaxis: PPI Glucose control:  SSI No Central venous access:  N/A Arterial line:  N/A Foley:  N/A Mobility:  bed rest  PT consulted: Yes Last date of multidisciplinary goals of care discussion [per primary.] Code Status:  full code Disposition: ICU  Labs   CBC: Recent Labs  Lab 05/19/21 1033 05/20/21 0356 05/20/21 1633 05/21/21 0402  05/21/21 2239 05/22/21 0607 05/23/21 0435  WBC 23.1* 20.9*  --  20.6* 19.1*  --  16.8*  HGB 7.6* 6.5* 7.0* 7.2* 6.7* 8.5* 7.7*  HCT 26.4* 23.1* 24.3* 24.8* 23.3* 28.5* 26.2*  MCV 73.5* 74.0*  --  72.5* 71.9*  --  74.0*  PLT 243 233  --  234 263  --  160    Basic Metabolic Panel: Recent Labs  Lab 05/17/21 0415 05/17/21 1023 05/19/21 1033 05/19/21 1601 05/20/21 0356 05/20/21 1001 05/21/21 0402 05/21/21 1032 05/21/21 2239 05/22/21 0607 05/22/21 1549 05/22/21 2139 05/23/21 0435  NA 150*   < > 156*   < > 156*   < > 155*   < > 157* 156* 155* 154* 152*  K 3.5   < > 4.3  --  4.5  --  4.3  --  4.3  --   --   --  4.1  CL 123*   < > >130*  --  >130*  --  128*  --  130*  --   --   --  118*  CO2 16*   < > 18*  --  21*  --  21*  --  24  --   --   --  24  GLUCOSE 127*   < > 128*  --  117*  --  140*  --  134*  --   --   --  126*  BUN 14   < > 20  --  25*  --  30*  --  28*  --   --   --  24*  CREATININE 1.48*   < > 1.54*  --  1.51*  --  1.52*  --  1.75*  --   --   --  1.39*  CALCIUM 8.6*   < > 9.3  --  9.0  --  9.1  --  9.0  --   --   --  9.3  MG 2.0  --   --   --   --   --   --   --   --   --   --   --   --   PHOS 3.3  --   --   --   --   --   --   --   --   --   --   --   --    < > = values in this interval not displayed.   GFR: Estimated Creatinine Clearance: 49.3 mL/min (A) (by C-G formula based on SCr of 1.39 mg/dL (H)). Recent Labs  Lab 05/20/21 0356 05/21/21 0402 05/21/21 2239 05/23/21 0435  WBC 20.9* 20.6* 19.1* 16.8*    Liver Function Tests: No results for input(s): AST, ALT, ALKPHOS, BILITOT, PROT, ALBUMIN in the last 168 hours.  No results for input(s): AMMONIA in the last 168 hours.  ABG    Component Value Date/Time   PHART 7.333 (L) 05/17/2021 1817   PCO2ART 33.7 05/17/2021 1817   PO2ART 156 (H) 05/17/2021 1817   HCO3 17.6 (L) 05/17/2021 1817   TCO2 19 (L) 05/17/2021 1817   ACIDBASEDEF 7.0 (H) 05/17/2021 1817   O2SAT 99.0 05/17/2021 1817     HbA1C: Hgb  A1c MFr Bld  Date/Time Value Ref Range Status  05/16/2021 04:57 AM 5.4 4.8 - 5.6 % Final    Comment:    (NOTE)         Prediabetes: 5.7 - 6.4         Diabetes: >6.4         Glycemic control for adults with diabetes: <7.0     CBG: Recent Labs  Lab 05/22/21 1124 05/22/21 1523 05/22/21 1925 05/22/21 2310 05/23/21 0317  GLUCAP 120* 129* 129* 132* 141*    CRITICAL CARE Performed by: Mick Sell   Total critical care time: 35 minutes   JD Geryl [facility] 05/23/2021, 7:10 AM  Please see [email or web address] for pager details.  From 7A-7P if no response, please call [phone]. After hours, please call ELink [phone].

## 2021-05-23 NOTE — Consult Note (Signed)
Consultation Note Date: 05/23/2021   [name]  DOB: 06-04-1976  MRN: [id number]  Age / Sex: 45 y.o., female  PCP: Mitzi Hansen, MD Referring Physician: Stroke, Md, MD  Reason for Consultation: Establishing goals of care  HPI/Patient Profile: 45 y.o. female  with past medical history of uncontrolled HTN, tobacco use disorder, uterine fibroids, heart failure, paroxysmal SVT, paranoid psychosis admitted on 05/15/2021 with AMS. She was diagnosed with acute large MCA stroke with subarachnoid hemorrhage and midline shift requiring mechanical ventilation for airway protection and hyperosmolar therapy for cerebral edema. PMT consulted to discuss [place].  Clinical Assessment and Goals of Care: I have reviewed medical records including EPIC notes, labs and imaging, received report from Dr. Erlinda Hong and RN, assessed the patient and then spoke with patient's brother, Katrinna Travieso, to discuss diagnosis prognosis, [place], EOL wishes, disposition and options.  I introduced Palliative Medicine as specialized medical care for people living with serious illness. It focuses on providing relief from the symptoms and stress of a serious illness. The goal is to improve quality of life for both the patient and the family.  [name] shares that he feels as though patient's quality of life was quite poor prior to current situation. She struggled with severe depression. He felt her uterine fibroids contributed to this - she did not like to go out in public due to this. He also tells me she recently lost her job. 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 She has never been married.    We discussed patient's current illness and what it means in the larger context of patient's on-going co-morbidities.  Natural disease trajectory and expectations at EOL were discussed. We discussed her stroke and current dependence on mechanical ventilation.  Discussed her AMS.   I attempted to elicit values and goals of care important to the patient.  [name] shares that patient has told him in the past she would not want to be a burden on others and she would not want to live in a facility.   The difference between aggressive medical intervention and comfort care was considered in light of the patient's goals of care. We discussed pursuing full scope care which would likely include trach/peg/long-term facility vs pursuing a comfort approach - freeing her from medical equipment and allowing natural disease process while providing care that assures she is comfortable.    Discussed with [name] the importance of continued conversation with family and the medical providers regarding overall plan of care and treatment options, ensuring decisions are within the context of the patient's values and GOCs.    [name] shares how difficult this decision is for him. We discussed honoring Ms [place] by attempting to make decisions for her we feel that she would make for herself.   He plans to come and see Ms. Stach this evening and he also plans to discuss situation with other family members. He asks for some time to consider how to move forward. We planned to follow up tomorrow regarding how to move forward.   Questions and concerns were addressed. The family was encouraged to call with questions or concerns.   Primary Decision Maker NEXT OF KIN - brother Sharisse Rantz   SUMMARY OF RECOMMENDATIONS   - family does not think patient would want to move forward with trach but asking for time to consider and speak to other family members - will follow up tomorrow  Code Status/Advance Care Planning:  Full  code      Primary Diagnoses: Present on Admission: . Stroke (cerebrum) ([place])   I have reviewed the medical record, interviewed the patient and family, and examined the patient. The following aspects are pertinent.  Past Medical History:  Diagnosis Date  .  Hypertension    Social History   Socioeconomic History  . Marital status: Single    Spouse name: Not on file  . Number of children: Not on file  . Years of education: Not on file  . Highest education level: Not on file  Occupational History  . Not on file  Tobacco Use  . Smoking status: Current Every Day Smoker  . Smokeless tobacco: Never Used  Substance and Sexual Activity  . Alcohol use: Not on file  . Drug use: Not on file  . Sexual activity: Not on file  Other Topics Concern  . Not on file  Social History Narrative  . Not on file   Social Determinants of Health   Financial Resource Strain: Not on file  Food Insecurity: Not on file  Transportation Needs: Not on file  Physical Activity: Not on file  Stress: Not on file  Social Connections: Not on file   History reviewed. No pertinent family history. Scheduled Meds: . sodium chloride   Intravenous Once  . amiodarone  200 mg Per Tube Daily  . amLODipine  5 mg Per Tube BID  . aspirin  81 mg Per Tube Daily  . atorvastatin  20 mg Per Tube Daily  . chlorhexidine gluconate (MEDLINE KIT)  15 mL Mouth Rinse BID  . Chlorhexidine Gluconate Cloth  6 each Topical Daily  . cloNIDine  0.2 mg Per Tube Q8H  . docusate  100 mg Per Tube BID  . feeding supplement (PROSource TF)  45 mL Per Tube BID  . free water  100 mL Per Tube Q4H  . hydrALAZINE  100 mg Per Tube Q8H  . isosorbide dinitrate  20 mg Per Tube TID  . mouth rinse  15 mL Mouth Rinse 10 times per day  . megestrol  80 mg Per Tube TID  . metoprolol tartrate  100 mg Per Tube BID  . modafinil  200 mg Per Tube q AM  . multivitamin with minerals  1 tablet Per Tube Daily  . pantoprazole sodium  40 mg Per Tube Daily  . polyethylene glycol  17 g Per Tube Daily   Continuous Infusions: .  ceFAZolin (ANCEF) IV 2 g (05/23/21 1138)  . feeding supplement (OSMOLITE 1.5 CAL) 1,000 mL (05/23/21 0946)   PRN Meds:.acetaminophen **OR** acetaminophen (TYLENOL) oral liquid 160 mg/5 mL  **OR** acetaminophen, diphenhydrAMINE, fentaNYL (SUBLIMAZE) injection, labetalol No Known Allergies Review of Systems  Physical Exam  Vital Signs: BP 140/71   Pulse 87   Temp 100.2 F (37.9 C) (Axillary)   Resp (!) 21   Ht '5\' 5"'  (1.651 m)   Wt 70.6 kg   LMP  (LMP Unknown) Comment: pt intubated  SpO2 100%   BMI 25.90 kg/m  Pain Scale: CPOT   Pain Score: 0-No pain   SpO2: SpO2: 100 % O2 Device:SpO2: 100 % O2 Flow Rate: .O2 Flow Rate (L/min): 2 L/min  IO: Intake/output summary:   Intake/Output Summary (Last 24 hours) at 05/23/2021 1310 Last data filed at 05/23/2021 0900 Gross per 24 hour  Intake 1830 ml  Output 1400 ml  Net 430 ml    LBM: Last BM Date: 05/22/21 Baseline Weight: Weight: 72.6 kg Most recent weight: Weight: 70.6  kg       Time Total: 60 mintues Greater than 50%  of this time was spent counseling and coordinating care related to the above assessment and plan.  Juel Burrow, DNP, AGNP-C Palliative Medicine Team [phone] Pager: [phone]

## 2021-05-24 DIAGNOSIS — D649 Anemia, unspecified: Secondary | ICD-10-CM | POA: Diagnosis not present

## 2021-05-24 DIAGNOSIS — R509 Fever, unspecified: Secondary | ICD-10-CM | POA: Diagnosis not present

## 2021-05-24 DIAGNOSIS — Z7189 Other specified counseling: Secondary | ICD-10-CM | POA: Diagnosis not present

## 2021-05-24 DIAGNOSIS — Z515 Encounter for palliative care: Secondary | ICD-10-CM | POA: Diagnosis not present

## 2021-05-24 DIAGNOSIS — I639 Cerebral infarction, unspecified: Secondary | ICD-10-CM | POA: Diagnosis not present

## 2021-05-24 DIAGNOSIS — I63511 Cerebral infarction due to unspecified occlusion or stenosis of right middle cerebral artery: Secondary | ICD-10-CM | POA: Diagnosis not present

## 2021-05-24 LAB — BASIC METABOLIC PANEL
Anion gap: 10 (ref 5–15)
BUN: 24 mg/dL — ABNORMAL HIGH (ref 6–20)
CO2: 24 mmol/L (ref 22–32)
Calcium: 9 mg/dL (ref 8.9–10.3)
Chloride: 116 mmol/L — ABNORMAL HIGH (ref 98–111)
Creatinine, Ser: 1.43 mg/dL — ABNORMAL HIGH (ref 0.44–1.00)
GFR, Estimated: 46 mL/min — ABNORMAL LOW (ref >60)
Glucose, Bld: 124 mg/dL — ABNORMAL HIGH (ref 70–99)
Potassium: 4.2 mmol/L (ref 3.5–5.1)
Sodium: 150 mmol/L — ABNORMAL HIGH (ref 135–145)

## 2021-05-24 LAB — CBC
HCT: 25.5 % — ABNORMAL LOW (ref 36.0–46.0)
Hemoglobin: 7.3 g/dL — ABNORMAL LOW (ref 12.0–15.0)
MCH: 21.4 pg — ABNORMAL LOW (ref 26.0–34.0)
MCHC: 28.6 g/dL — ABNORMAL LOW (ref 30.0–36.0)
MCV: 74.8 fL — ABNORMAL LOW (ref 80.0–100.0)
Platelets: 265 10*3/uL (ref 150–400)
RBC: 3.41 MIL/uL — ABNORMAL LOW (ref 3.87–5.11)
RDW: 23.3 % — ABNORMAL HIGH (ref 11.5–15.5)
WBC: 15.4 10*3/uL — ABNORMAL HIGH (ref 4.0–10.5)
nRBC: 0.5 % — ABNORMAL HIGH (ref 0.0–0.2)

## 2021-05-24 LAB — GLUCOSE, CAPILLARY
Glucose-Capillary: 131 mg/dL — ABNORMAL HIGH (ref 70–99)
Glucose-Capillary: 117 mg/dL — ABNORMAL HIGH (ref 70–99)
Glucose-Capillary: 116 mg/dL — ABNORMAL HIGH (ref 70–99)
Glucose-Capillary: 110 mg/dL — ABNORMAL HIGH (ref 70–99)
Glucose-Capillary: 116 mg/dL — ABNORMAL HIGH (ref 70–99)
Glucose-Capillary: 119 mg/dL — ABNORMAL HIGH (ref 70–99)

## 2021-05-24 LAB — SODIUM: Sodium: 150 mmol/L — ABNORMAL HIGH (ref 135–145)

## 2021-05-24 LAB — TRIGLYCERIDES: Triglycerides: 81 mg/dL (ref <150)

## 2021-05-24 MED ORDER — FREE WATER
150.0000 mL | Status: DC
  Administered 2021-05-24 – 2021-05-25 (×6): 150 mL

## 2021-05-24 MED ORDER — MIDAZOLAM HCL 2 MG/2ML IJ SOLN
5.0000 mg | Freq: Once | INTRAMUSCULAR | Status: AC
  Administered 2021-05-25: 6 mg via INTRAVENOUS
  Filled 2021-05-24: qty 6

## 2021-05-24 MED ORDER — PROPOFOL 10 MG/ML IV BOLUS
50.0000 mg | Freq: Once | INTRAVENOUS | Status: DC
  Filled 2021-05-24: qty 20

## 2021-05-24 MED ORDER — FENTANYL CITRATE (PF) 100 MCG/2ML IJ SOLN
200.0000 ug | Freq: Once | INTRAMUSCULAR | Status: AC
  Administered 2021-05-25: 200 ug via INTRAVENOUS
  Filled 2021-05-24: qty 4

## 2021-05-24 MED ORDER — VECURONIUM BROMIDE 10 MG IV SOLR
10.0000 mg | Freq: Once | INTRAVENOUS | Status: AC
  Administered 2021-05-25: 10 mg via INTRAVENOUS
  Filled 2021-05-24: qty 10

## 2021-05-24 NOTE — Plan of Care (Signed)
Pt is intubated, calm and cooperative, forced gaze to the right, No change from prior RN report

## 2021-05-24 NOTE — Progress Notes (Signed)
SLP Cancellation Note  Patient Details Name: Holly Bradley MRN: [id number] DOB: April 21, 1976   Cancelled treatment:        Chart reviewed and noted recommendations for trach and family is discussing. With an ETT an speech-language-cognitive assessment will not get optimum results ( and she is not a TBI in which case SLE would be appropriate), therefore therapist will monitor for decision of trach and will initiate evaluation when pt ready.    [name] 05/24/2021, 8:40 AM  [name].Ed Risk analyst [phone] Office [phone]

## 2021-05-24 NOTE — Progress Notes (Signed)
NAME:  Holly Bradley, MRN:  [id number], DOB:  08-28-1976, LOS: 9  ADMISSION DATE:  05/15/2021, CONSULTATION DATE:  05/24/21 REFERRING MD:  Leonie Man, CHIEF COMPLAINT:  Respiratory failure   History of Present Illness:  Holly Bradley is a [name] with PMH of HTN, tobacco use, CHF, DMT2 (controlled with diet and exercise), pSVT, uterine fibroids, paranoid psychosis who presented with AMS on 5/24.   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  On follow up imaging, she had stable appearing SAH and continued R basal ganglia infarct with 31mm R to L shift.   She was admitted to the Neuro ICU and treating with hypertonic saline, statin and BP control.  On 5/26, PCCM consulted when pt became acutely less responsive with respiratory distress along with abdominal distension worse than baseline, per patient.  She was intubated and repeat head CT, abd/pelvis CT ordered.  Repeat head CT on 5/26 showed increased cytotoxic edema and mildly increased leftward midline shift, now 5 mm, decreased SAH and IVH.  Abdominal CT showing 22.9 cm mass, possibly consistent with uterine fibroids, no lymphadenopathy.      Pertinent  Medical History  -CHF -tobacco use -DMT2 -HTN -pSVT -uterine fibroids -paranoid psychosis   Significant Hospital Events: Including procedures, antibiotic start and stop dates in addition to other pertinent events   . 5/24 Admit to neurology after ED visit with AMS, found to have R MCA stroke, underwent mechanical thrombectomy, M3 coil embolization, subsequent SAH . 5/26 decreased mental status and respiratory distress, intubated  . 6/1 still not candidate for extubation with poor mentation and cough mechanics. Family unsure if pt would want trach. Staph epi bacteremia, continues on ancef  . 6/2 moving RUE RLE  following some commands. decision to move forward with tracheostomy.   Interim History / Subjective:   NAEO  PSV/CPAP this morning -- had incr RR to low 30s and placed back on PRVC   Brother at bedside, has decided he wants to pursue trach    Objective   Blood pressure (!) 141/73, pulse 85, temperature 99.4 F (37.4 C), temperature source Axillary, resp. rate (!) 23, height 5\' 5"  (1.651 m), weight 70.6 kg, SpO2 100 %.    Vent Mode: PRVC FiO2 (%):  [40 %] 40 % Set Rate:  [20 bmp] 20 bmp Vt Set:  [460 mL] 460 mL PEEP:  [5 cmH20] 5 cmH20 Pressure Support:  [8 cmH20] 8 cmH20 Plateau Pressure:  [16 cmH20-20 cmH20] 16 cmH20   Intake/Output Summary (Last 24 hours) at 05/24/2021 0724 Last data filed at 05/24/2021 0600 Gross per 24 hour  Intake 2020 ml  Output 1150 ml  Net 870 ml   Filed Weights   05/21/21 0432 05/22/21 0500 05/23/21 0500  Weight: 75.5 kg 71.6 kg 70.6 kg    General:  Chronically and critically ill appearing middle aged F, intubated NAD  HEENT: NCAT. ETT secure cortrak secure. Dry mm. Some periorbital edema  Neuro: R sided gaze. Can weakly stick out tongue to command, can lift RUE to command and give thumbs up, can move RLE to command. Can raise RUE to command. L side flaccid. +cough/gag to stimulation.  CV: rrr s1s2 2+ radial pulses  PULM: Incr RR on PSV/CPAP. No accessory use. Few respiratory secretions  GI: large hard uterine fibroids. + bowel sounds.  Extremities: No acute deformity.  Some non-pitting edema BLE.   Labs/imaging that I havepersonally reviewed  (right click and "Reselect all SmartList Selections" daily)   CT Head 5/30: Stable R MCA infarct. 5 mm R to L midline shift (no significant change).   6/2: Na 150 Hgb 7.3 Cr 1.43   Assessment & Plan:   Acute respiratory failure with hypoxia requiring mechanical ventilation Staph aureus PNA  Plan: -d/w brother at bedside -- will move forward with tracheostomy 6/3 approx 1000.  -NPO at midnight -cont  efforts at weaning as able-- mentation and cough continue to be greatest limitations  -Cefazolin X 7days (stop date 6/6)  MCA CVA with SAH, midline shift, cerebral edema  Plan: -cont neuro checks  -SBP goal < 160 -provigil  Staph epi bacteremia Plan: -ancef through 6/6  -trend fever curve and wbc   AKI Hypernatremia, mild (iatrogenic, was previously on hypertonic) Plan: -incr FWF 160ml q4hr (6/2) -trend renal indices and UOP  HFrEF  -(EF 20-25% 12/2020) Plan: -amio, isosorbide -icu monitoring   HTN  Plan: -SBP goal <160 -Amlodipine, clonidine, hydralazine, metop, PRN labetalol   Large uterine fibroids  Acute on chronic anemia -iron deficiency anemia, ABLA  Plan: -IV iron -transfuse for hgb < 7 -Megace 80mg  TID per OB  -Poor surgical candidate  Hx HLD Plan: -statin   Goals of Care -Conversation w PCCM NP Shirlee Limerick and brother 6/2: ---brother serves as Media planner - he is struggling greatly with feelings of guilt and grief-- would like to move forward with trach to facilitate possible further recovery. he is appropriately concerned about qol. We talked about ongoing evaluation of acceptable QOL for the pt, and brother feels that if she does not make meaningful progress toward an acceptable QOL then he would revisit possible comfort measures. He does not want pt to suffer. He has an appropriate understanding of her comorbidities and limitations with fibroids  -Palliative care is also following, appreciate their help   Best practice (right click and "Reselect all SmartList Selections" daily)  Diet:  Tube Feed , Pain/Anxiety/Delirium protocol (if indicated): Yes (RASS goal 0) Fentanyl prn but not getting due to mental status VAP protocol (if indicated): Yes DVT prophylaxis: SCD GI prophylaxis: PPI Glucose control:  SSI No Central venous access:  N/A Arterial line:  N/A Foley:  N/A Mobility:  bed rest  PT consulted: Yes Last date of multidisciplinary goals of care  discussion [per primary.] Code Status:  full code Disposition: ICU  Labs   CBC: Recent Labs  Lab 05/20/21 0356 05/20/21 1633 05/21/21 0402 05/21/21 2239 05/22/21 0607 05/23/21 0435 05/24/21 0445  WBC 20.9*  --  20.6* 19.1*  --  16.8* 15.4*  HGB 6.5*   < > 7.2* 6.7* 8.5* 7.7* 7.3*  HCT 23.1*   < > 24.8* 23.3* 28.5* 26.2* 25.5*  MCV 74.0*  --  72.5* 71.9*  --  74.0* 74.8*  PLT 233  --  234 263  --  257 265   < > = values in this interval not displayed.    Basic Metabolic Panel: Recent Labs  Lab 05/20/21 0356 05/20/21 1001 05/21/21 0402 05/21/21 1032 05/21/21 2239 05/22/21 0607 05/23/21 0435 05/23/21 0953 05/23/21 1541 05/23/21 2159 05/24/21 0445  NA 156*   < > 155*   < > 157*   < > 152* 153* 152* 152* 150*  K 4.5  --  4.3  --  4.3  --  4.1  --   --   --  4.2  CL >130*  --  128*  --  130*  --  118*  --   --   --  116*  CO2 21*  --  21*  --  24  --  24  --   --   --  24  GLUCOSE 117*  --  140*  --  134*  --  126*  --   --   --  124*  BUN 25*  --  30*  --  28*  --  24*  --   --   --  24*  CREATININE 1.51*  --  1.52*  --  1.75*  --  1.39*  --   --   --  1.43*  CALCIUM 9.0  --  9.1  --  9.0  --  9.3  --   --   --  9.0   < > = values in this interval not displayed.   GFR: Estimated Creatinine Clearance: 47.9 mL/min (A) (by C-G formula based on SCr of 1.43 mg/dL (H)). Recent Labs  Lab 05/21/21 0402 05/21/21 2239 05/23/21 0435 05/24/21 0445  WBC 20.6* 19.1* 16.8* 15.4*    Liver Function Tests: No results for input(s): AST, ALT, ALKPHOS, BILITOT, PROT, ALBUMIN in the last 168 hours.  No results for input(s): AMMONIA in the last 168 hours.  ABG    Component Value Date/Time   PHART 7.333 (L) 05/17/2021 1817   PCO2ART 33.7 05/17/2021 1817   PO2ART 156 (H) 05/17/2021 1817   HCO3 17.6 (L) 05/17/2021 1817   TCO2 19 (L) 05/17/2021 1817   ACIDBASEDEF 7.0 (H) 05/17/2021 1817   O2SAT 99.0 05/17/2021 1817     HbA1C: Hgb A1c MFr Bld  Date/Time Value Ref Range  Status  05/16/2021 04:57 AM 5.4 4.8 - 5.6 % Final    Comment:    (NOTE)         Prediabetes: 5.7 - 6.4         Diabetes: >6.4         Glycemic control for adults with diabetes: <7.0     CBG: Recent Labs  Lab 05/23/21 1506 05/23/21 1943 05/23/21 2304 05/24/21 0309 05/24/21 0714  GLUCAP 124* 125* 131* 131* 117*    CRITICAL CARE Performed by: Cristal Generous   Total critical care time: 48 minutes  Critical care time was exclusive of separately billable procedures and treating other patients. Critical care was necessary to treat or prevent imminent or life-threatening deterioration.  Critical care was time spent personally by me on the following activities: development of treatment plan with patient and/or surrogate as well as nursing, discussions with consultants, evaluation of patient's response to treatment, examination of patient, obtaining history from patient or surrogate, ordering and performing treatments and interventions, ordering and review of laboratory studies, ordering and review of radiographic studies, pulse oximetry and re-evaluation of patient's condition.  [name] MSN, AGACNP-BC [place] for pager details  05/24/2021, 10:02 AM

## 2021-05-24 NOTE — TOC Initial Note (Signed)
Transition of Care [facility]) - Initial/Assessment Note    Patient Details  Name: Holly Bradley MRN: [id number] Date of Birth: [birth year]  [facility] CM/SW Contact:    Ella Bodo, RN Phone Number: 05/24/2021, 5:17 PM  Clinical Narrative:   45 yo female s/p mechanical thrombectomy on 5/24 of right MCA M1/M2/M3 and embolization of right MCA M3 branch due to contrast extravasation. Pt re-intubated 5/26. Repeat head CT on 5/26 showed increased cytotoxic edema and mildly increased leftward midline shift, now 5 mm, decreased SAH and IVH. Prior to admission, patient independent and working; lives with brother.  Noted plans for tracheostomy on 05/25/2021.  TOC consult to discuss issues with Medicaid: Currently it appears patient has Medicaid of [place].  I spoke with her brother, who states that patient moved from [place] approximately a year ago, and has not changed her Medicaid over to [facility].  It appears patient may need long-term care after tracheostomy; uncertain if [place] Medicaid will pay for long-term care in [place].  Advised patient's brother to follow-up with DSS regarding Medicaid issue.  Also left a message with financial counselor for assistance.  Brother appreciative of my call; states he will contact DSS for assistance.  Will provide updates as they are available.               Expected Discharge Plan: Skilled Nursing Facility Barriers to Discharge: Continued Medical Work up        Expected Discharge Plan and Services Expected Discharge Plan: [place]   Discharge Planning Services: CM Consult   Living arrangements for the past 2 months: Single Family Home                                      Prior Living Arrangements/Services Living arrangements for the past 2 months: Single Family Home Lives with:: Siblings Patient language and need for interpreter reviewed:: Yes        Need for Family Participation in  Patient Care: Yes (Comment) Care giver support system in place?: No (comment)   Criminal Activity/Legal Involvement Pertinent to Current Situation/Hospitalization: No - Comment as needed  Activities of Daily Living Home Assistive Devices/Equipment: None ADL Screening (condition at time of admission) Patient's cognitive ability adequate to safely complete daily activities?: Yes Is the patient deaf or have difficulty hearing?: No Does the patient have difficulty seeing, even when wearing glasses/contacts?: No Does the patient have difficulty concentrating, remembering, or making decisions?: No Patient able to express need for assistance with ADLs?: Yes Does the patient have difficulty dressing or bathing?: No Independently performs ADLs?: Yes (appropriate for developmental age) Does the patient have difficulty walking or climbing stairs?: No Weakness of Legs: None Weakness of Arms/Hands: None  Permission Sought/Granted                  Emotional Assessment Appearance:: Appears stated age Attitude/Demeanor/Rapport: Unable to Assess Affect (typically observed): Unable to Assess        Admission diagnosis:  Stroke (cerebrum) [facility]) [I63.9] Cerebrovascular accident (CVA), unspecified mechanism ([place]) [I63.9] Patient Active Problem List   Diagnosis Date Noted  . Stroke (cerebrum) ([place]) 05/15/2021   PCP:  Mitzi Hansen, MD Pharmacy:   [facility] [place] [address] Phone: [phone] Fax: [phone]  [id number] - [place], [place]  [address] [place] [address] Phone: [phone] Fax: [phone]  Reinaldo Raddle, RN, BSN  Trauma/Neuro ICU Case Manager [phone]    Social Determinants of Health (SDOH) Interventions    Readmission Risk Interventions No flowsheet data found.

## 2021-05-24 NOTE — Progress Notes (Signed)
   05/24/21 0823  Airway 7.5 mm  Placement Date/Time: 05/17/21 1640   Inserted prior to hospital arrival?: Other (Comment)  Placed By: ICU physician  ETT Types: Endobronchial;Oral  Size (mm): 7.5 mm  Cuffed: Cuffed  Airway Equipment: Video Laryngoscope  Secured at (cm): 25 cm  Secured at (cm) 26 cm  Measured From Lips  Secured Location Right  Secured By Actuary Repositioned Yes  Prone position No  Cuff Pressure (cm H2O) Green OR 18-26 CmH2O  Site Condition Dry  Adult Ventilator Settings  Vent Type Servo i  Humidity HME  Vent Mode PRVC  Vt Set 460 mL  Set Rate 20 bmp  FiO2 (%) 40 %  I Time 0.9 Sec(s)  PEEP 5 cmH20  Adult Ventilator Measurements  Peak Airway Pressure 18 L/min  Mean Airway Pressure 9 cmH20  Plateau Pressure 16 cmH20  Resp Rate Spontaneous 0 br/min  Resp Rate Total 22 br/min  Exhaled Vt 500 mL  Spont TV 475 mL  Measured Ve 10.3 mL  I:E Ratio Measured 1:2  Auto PEEP 0 cmH20  Total PEEP 5 cmH20  SpO2 100 %  Adult Ventilator Alarms  Alarms On Y  Ve High Alarm 18 L/min  Ve Low Alarm 4 L/min  Resp Rate High Alarm 38 br/min  Resp Rate Low Alarm 10  PEEP Low Alarm 3 cmH2O  Press High Alarm 45 cmH2O  Daily Weaning Assessment  Daily Assessment of Readiness to Wean Wean protocol criteria met (SBT performed)  SBT Method CPAP 5 cm H20 and PS 5 cm H20  Weaning Start Time [phone]  Patient response Failed SBT terminated  Reason SBT Terminated RR > 35 breaths/min or Frequency/TV ratio >105;Excessive agitation, marked accessory muscle use, abdominal paradox, or diaphoresis noted  Breath Sounds  Bilateral Breath Sounds Diminished  Airway Suctioning/Secretions  Suction Type ETT  Suction Device  Catheter  Inline suction changed Yes  Secretion Amount None  Suction Tolerance Tolerated fairly well  Suctioning Adverse Effects Anxiety

## 2021-05-24 NOTE — Progress Notes (Signed)
STROKE TEAM PROGRESS NOTE   INTERVAL HISTORY Palliative care NP Ok Edwards and pt brother are at the bedside. Brother has consented for trach which likely to be done tomorrow. Pt still intubated but seems slightly more awake then yesterday, follows simple commands on the right hand and foot. Spontaneous purposeful movement of right arm and hand.    Vitals:   05/24/21 0900 05/24/21 1100 05/24/21 1125 05/24/21 1200  BP: (!) 109/92 136/78  140/70  Pulse: 91 83  81  Resp: (!) 22 19  20   Temp:      TempSrc:      SpO2: 100% 100% 100% 100%  Weight:      Height:       CBC:  Recent Labs  Lab 05/23/21 0435 05/24/21 0445  WBC 16.8* 15.4*  HGB 7.7* 7.3*  HCT 26.2* 25.5*  MCV 74.0* 74.8*  PLT 257 102   Basic Metabolic Panel:  Recent Labs  Lab 05/23/21 0435 05/23/21 0953 05/24/21 0445 05/24/21 1016  NA 152*   < > 150* 150*  K 4.1  --  4.2  --   CL 118*  --  116*  --   CO2 24  --  24  --   GLUCOSE 126*  --  124*  --   BUN 24*  --  24*  --   CREATININE 1.39*  --  1.43*  --   CALCIUM 9.3  --  9.0  --    < > = values in this interval not displayed.   Lipid Panel:  Recent Labs  Lab 05/24/21 0447  TRIG 81   HgbA1c:  No results for input(s): HGBA1C in the last 168 hours. Urine Drug Screen:  No results for input(s): LABOPIA, COCAINSCRNUR, LABBENZ, AMPHETMU, THCU, LABBARB in the last 168 hours.  Alcohol Level  No results for input(s): ETH in the last 168 hours.  IMAGING  CT HEAD WO CONTRAST Result Date: 05/16/2021  IMPRESSION:  1. Continued interval evolution of moderately large right MCA distribution infarct. Associated regional mass effect has mildly increased, with new 2 mm of right-to-left midline shift. No hydrocephalus or trapping.  2. Decreased prominence of subarachnoid hemorrhage at the posterior right Sylvian fissure and overlying right frontoparietal region. Trace intraventricular hemorrhage related to redistribution.  3. Question new area of parenchymal hypodensity  involving the lateral left cerebellum. While this finding is favored to be artifactual, a possible new acute ischemic infarct is difficult to exclude, and could be considered in the correct clinical setting. Attention at follow-up recommended.  4. No other new acute intracranial abnormality.   CT HEAD WO CONTRAST Result Date: 05/15/2021  IMPRESSION: Small-to-moderate volume hyperdensity within the right sylvian fissure and along portions of the right frontoparietal and temporal lobes compatible with extravasated contrast/subarachnoid hemorrhage. There is hyperdensity within the right basal ganglia, right insula, lateral right frontal lobe/frontal operculum and within portions of the right parietal lobe likely reflecting contrast staining at sites of acute infarction. Mild associated mass effect with subtle partial effacement of the right lateral ventricle. No midline shift or hydrocephalus. Embolization material now overlies the lateral right parietal lobe. Stable background parenchymal atrophy and chronic small vessel ischemic disease. Redemonstrated chronic infarct within the superior left cerebellum.   MR BRAIN WO CONTRAST Result Date: 05/15/2021 IMPRESSION:  1. Moderately large acute right MCA infarct. Associated petechial hemorrhage, subarachnoid hemorrhage, and minimal intraventricular hemorrhage.  2. Severe chronic small vessel ischemic disease with multiple chronic infarcts.   MR CERVICAL SPINE WO CONTRAST Result  Date: 05/15/2021  IMPRESSION: The cord itself appears normal. No disc disease. No spinal stenosis. 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 No significant encroachment upon the neural spaces. Some prevertebral soft tissue edema. In this clinical scenario, this may be subsequent to previous endotracheal intubation. One could not rule out pharyngitis or cellulitis but that seems less likely. I do not see any traumatic injury to the cervical spine itself. Electronically  Signed   By: Nelson Chimes M.D.   On: 05/15/2021 16:05   IR CT Head Ltd Result Date: 05/15/2021 CT of the head was obtained and post processed in a separate workstation with concurrent attending physician supervision. Selected images were sent to PACS. Small right sylvian contrast extravasation confirmed. Additionally, hyperdensity of the right basal ganglia and lateral right frontal cortex are noted, related to ongoing ischemia. Right internal carotid artery angiograms with frontal and lateral views of the head showed occlusion of the embolized branch with no evidence of contrast extravasation. Patency of the remaining MCA branches noted.  IR PERCUTANEOUS ART THROMBECTOMY/INFUSION INTRACRANIAL INC DIAG ANGIO Result Date: 05/15/2021 IMPRESSION: Mechanical thrombectomy performed for treatment of right M1/MCA occlusion with clot fragmentation and embolization to same distal territory requiring multiple additional passes. Procedure complicated by contrast extravasation at a distal right M3/MCA branch requiring vessel embolization. Near complete recanalization of the right MCA vascular tree was achieved (TICI 2b). PLAN: - Bed rest post femoral access x6h - Follow-up CT in 4 hours to evaluate for SAH/contrast extravasation stability - Transfer to ICU- SBP: 120-140 mmHg   Echo Result read: 05/16/2021 1. Left ventricular ejection fraction, by estimation, is 55 to 60%. The  left ventricle has normal function. The left ventricle has no regional  wall motion abnormalities. There is moderate concentric left ventricular  hypertrophy. Left ventricular  diastolic parameters are consistent with Grade II diastolic dysfunction  (pseudonormalization). Elevated left atrial pressure.  2. Right ventricular systolic function is normal. The right ventricular  size is normal. Mildly increased right ventricular wall thickness. There  is moderately elevated pulmonary artery systolic pressure. The estimated  right  ventricular systolic pressure  is 19.3 mmHg.  3. Left atrial size was severely dilated.  4. Right atrial size was severely dilated.  5. There is no evidence of cardiac tamponade.  6. The mitral valve is normal in structure. Trivial mitral valve  regurgitation. No evidence of mitral stenosis.  7. The aortic valve is normal in structure. There is mild calcification  of the aortic valve. There is mild thickening of the aortic valve. Aortic  valve regurgitation is not visualized. Mild aortic valve sclerosis is  present, with no evidence of aortic  valve stenosis.  8. The inferior vena cava is dilated in size with <50% respiratory  variability, suggesting right atrial pressure of 15 mmHg.   PHYSICAL EXAM  Temp:  [99.1 F (37.3 C)-101 F (38.3 C)] 101 F (38.3 C) (06/02 0800) Pulse Rate:  [79-91] 81 (06/02 1200) Resp:  [4-24] 20 (06/02 1200) BP: (109-156)/(64-126) 140/70 (06/02 1200) SpO2:  [100 %] 100 % (06/02 1200) FiO2 (%):  [40 %] 40 % (06/02 1125)  General - Well nourished, well developed, intubated off sedation.  Ophthalmologic - fundi not visualized due to noncooperation.  Cardiovascular - Regular rhythm and rate  Neuro - intubated off sedation, eyes closed, not open with voice stimulation, able to follow commands on the right hand and foot. With forced eye opening, eyes in right forced gaze preference, not blinking to  visual threat on the left, not tracking, PERRL. B/l eyelids mildly swollen. Corneal reflex present, gag and cough present. Breathing over the vent.  Facial symmetry not able to test due to ET tube.  Tongue protrusion not cooperative. On pain stimulation, left UE and LE flaccid. Right UE proximal 0/5 and distal finger movement 2+/5 and RLE rigorously movement in bed with stimulation. No babinski. Sensation, coordination not cooperative and gait not tested.   ASSESSMENT/PLAN [name] is a 45 y.o. female with history of uncontrolled HTN, tobacco  use disorder, large uterine fibroids, heart failure, paroxysmal SVT, paranoid psychosis who presents with altered mental status after being found down.  CT head initially read as small acute appearing perforator infarct in the R BG and notable for severe small vessel ischemia. However concerning for potential R MCA stroke and thus CTA head and neck and CTP was obtained with a R MCA M1 occlusion and a core of 50cc and 71ml of penumbra.   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   CTA neck: LVO right M1 origin. Left vertebral occlusion at its origin. High-grade narrowings of the right cavernous ICA.  CT perfusion suggestive 50 cc core infarct and 50 cc of adjacent penumbra.  MRI: large acute right MCA infarct. Associated petechial hemorrhage, subarachnoid hemorrhage, and minimal intraventricular hemorrhage.  CT 5/28 stable right MCA infarct with MLS  CT 5/30 stable right MCA infarct and MLS 53mm  2D Echo 55 to 60%  Further cardioembolic work up once pt neuro improves  UDS: negative  LDL 76  HgbA1c 5.4  UDS neg  Sickle cell screen - neg, ANA neg  VTE prophylaxis - SCDs  No antithrombotic prior to admission, now on ASA 81.  Therapy recommendations:  CIR  Disposition:  Pending  Cytotoxic cerebral edema  On 3% saline @ 30 -> FW 100 Q4  Na 157->156->157->155->157->153->150  CT head 5/30 stable right MCA infarcts and MLS 39mm  OK to gradually normalize Na level  Respiratory failure  Intubated 5/26 for increased work of breathing with altered mental status   Management per CCM, appreciated  Switched to precedex -> now off   Not candidate for extubation  May need trach per CCM  Brother consented on trach -> likely to be done in am  Acute blood loss anemia Large uterine  fibroids  hgb 7.8->5.9->PRBC->7.6->6.5->PRBC->7.2->6.7->PRBC->8.5->7.7->7.3  PRBC tranfusion x 3  extensive virginal bleeding from extremely large abdominopelvic mass measuring up to 22.9 cm noted on CT 5/26  Has been followed with Dr. Jonna Clark - poor surgical candidate due to uncontrolled HTN and SVT  Abdominal distention on exam  Increase Megace to megace 80mg  tid  OBGYN Dr. Rosana Hoes consulted, appreciate help - not surgical candidate  Dysphagia  Likely due to stroke  SLP following  cortrak placement  On TF @ 50 and FW  Paroxysmal SVT history Hx of CHF  Followed with cardiology   EF 40-45% in 04/2020 and 20-25% in 12/2020  This admission EF 55-60%  on amiodarone 200mg  daily and isosorbide 20 tid  Currently heart rate controlled  Hypertension  Home meds:  Hydralazine 100mg  TID, Imdur 60mg  daily, Toprol XL 200mg  daily  Off Cleviprex now  Amlodipine 10mg  daily -> 5mg  bid  clonidine 0.2 tid -> Q8h  Hydralazine 100mg  tid -> q8h  imdur 20mg  tid -> Q8h  Metoprolol 100mg  bid  SBP goal to <160 mmHg  Labetalol prn . Long-term BP goal normotensive  Hyperlipidemia  Home meds:  none  LDL 76, goal < 70  Add atorvastatin 20mg  daily  Continue statin at discharge  Tobacco abuse  Current smoker  Smoking cessation counseling will be provided  Fever and Leukocytosis  WBC 12.7->14.0->23.1->20.9->20.6->19.1->16.8->15.4  Tmax 101.9->100.1  Sputum culture - staph Aures   Blood culture G+ cocci in clusters  CXR LLL atx vs. pneumonia  CCM on board  On ancef   Other Stroke Risk Factors  Coronary artery disease  Other Active Problems  AKI: creatinine 1.27->1.48->1.54->1.51->1.52->1.75->1.39->1.43  Lethargic - added provigil by CCM   hospital day #9  This patient is critically ill due to large right MCA infarct, bacteremia, severe anemia, hemorrhagic conversion, fever and leukocytosis, CKD and at significant risk of neurological worsening,  death form cerebral edema, severe anemia, repiratory failure, sepsis. This patient's care requires constant monitoring of vital signs, hemodynamics, respiratory and cardiac monitoring, review of multiple databases, neurological assessment, discussion with family, other specialists and medical decision making of high complexity. I spent 40 minutes of neurocritical care time in the care of this patient. I had long discussion with brother at bedside, updated pt current condition, treatment plan and potential prognosis, and answered all the questions. He expressed understanding and appreciation. I also discussed with palliative care NP Shea.   Rosalin Hawking, MD PhD Stroke Neurology 05/24/2021 12:48 PM     To contact Stroke Continuity provider, please refer to [email or web address] After hours, contact General Neurology

## 2021-05-24 NOTE — Progress Notes (Signed)
Daily Progress Note   [name]       Date: 05/24/2021 DOB: 07/27/1976  Age: 45 y.o. MRN#: [id number] Attending Physician: Stroke, Md, MD Primary Care Physician: Mitzi Hansen, MD Admit Date: 05/15/2021  Reason for Consultation/Follow-up: Establishing goals of care  Subjective: Unable to communicate though does follow some commands Brother at bedside  Length of Stay: 9  Current Medications: Scheduled Meds:  . sodium chloride   Intravenous Once  . amiodarone  200 mg Per Tube Daily  . amLODipine  5 mg Per Tube BID  . aspirin  81 mg Per Tube Daily  . atorvastatin  20 mg Per Tube Daily  . chlorhexidine gluconate (MEDLINE KIT)  15 mL Mouth Rinse BID  . Chlorhexidine Gluconate Cloth  6 each Topical Daily  . cloNIDine  0.2 mg Per Tube Q8H  . docusate  100 mg Per Tube BID  . feeding supplement (PROSource TF)  45 mL Per Tube BID  . [START ON 05/25/2021] fentaNYL (SUBLIMAZE) injection  200 mcg Intravenous Once  . free water  150 mL Per Tube Q4H  . hydrALAZINE  100 mg Per Tube Q8H  . isosorbide dinitrate  20 mg Per Tube TID  . mouth rinse  15 mL Mouth Rinse 10 times per day  . megestrol  80 mg Per Tube TID  . metoprolol tartrate  100 mg Per Tube BID  . [START ON 05/25/2021] midazolam  5 mg Intravenous Once  . modafinil  200 mg Per Tube q AM  . multivitamin with minerals  1 tablet Per Tube Daily  . pantoprazole sodium  40 mg Per Tube Daily  . polyethylene glycol  17 g Per Tube Daily  . [START ON 05/25/2021] propofol  50 mg Intravenous Once  . [START ON 05/25/2021] vecuronium  10 mg Intravenous Once    Continuous Infusions: .  ceFAZolin (ANCEF) IV 2 g (05/24/21 0323)  . feeding supplement (OSMOLITE 1.5 CAL) 1,000 mL (05/23/21 0946)    PRN Meds: acetaminophen **OR** acetaminophen  (TYLENOL) oral liquid 160 mg/5 mL **OR** acetaminophen, diphenhydrAMINE, fentaNYL (SUBLIMAZE) injection, labetalol  Physical Exam Constitutional:      General: She is not in acute distress.    Comments: Does not open eyes, follows some commands with RUE and RLE  Cardiovascular:     Rate and Rhythm: Normal rate.  Pulmonary:     Comments: Failed SBT Abdominal:     Palpations: There is mass.  Skin:    General: Skin is warm and dry.             Vital Signs: BP (!) 109/92   Pulse 91   Temp (!) 101 F (38.3 C) (Axillary)   Resp (!) 22   Ht '5\' 5"'  (1.651 m)   Wt 70.6 kg   LMP  (LMP Unknown) Comment: pt intubated  SpO2 100%   BMI 25.90 kg/m  SpO2: SpO2: 100 % O2 Device: O2 Device: Ventilator O2 Flow Rate: O2 Flow Rate (L/min): 2 L/min  Intake/output summary:   Intake/Output Summary (Last 24 hours) at 05/24/2021 1035 Last data filed at 05/24/2021 0600 Gross per 24 hour  Intake 1790 ml  Output 1150 ml  Net 640  ml   LBM: Last BM Date: 05/22/21 Baseline Weight: Weight: 72.6 kg Most recent weight: Weight: 70.6 kg      Flowsheet Rows   Flowsheet Row Most Recent Value  Intake Tab   Referral Department Critical care  Unit at Time of Referral ICU  Palliative Care Primary Diagnosis Neurology  Date Notified 05/22/21  Palliative Care Type New Palliative care  Reason for referral Clarify Goals of Care  Date of Admission 05/15/21  Date first seen by Palliative Care 05/23/21  # of days Palliative referral response time 1 Day(s)  # of days IP prior to Palliative referral 7  Clinical Assessment   Psychosocial & Spiritual Assessment   Palliative Care Outcomes       Patient Active Problem List   Diagnosis Date Noted  . Stroke (cerebrum) ([place]) 05/15/2021    Palliative Care Assessment & Plan   HPI: 45 y.o. female  with past medical history of uncontrolled HTN,tobacco use disorder, uterine fibroids, heart failure, paroxysmal SVT, paranoid psychosis admitted on 05/15/2021 with  AMS. She was diagnosed with acute large MCA stroke with subarachnoid hemorrhage and midline shift requiring mechanical ventilation for airway protection and hyperosmolar therapy for cerebral edema. PMT consulted to discuss [place]  Assessment: Called to bedside by RN as brother, [name], is at bedside requesting further conversation.  I arrived, [name] shares he has decided to move forward with trach as he would like to give patient more time. He shares how difficult this situation is and he feels as though the decision he has to make is impossible. Validation and emotional support provided.  He tells me he spoke with other family members about the situation - one shared support of whatever decision he makes and the other shared that he would like to pursue trach. He still feels as though he is making the decision independently and feels uncomfortable in this role.  We again review the role of surrogate decision making - discuss attempting to make decision Ms. Pyon would make for herself. I ask [name] to consider if Ms. Wyble were sitting and listening to all the medical information and understood her options - what would she choose. He tells me he does not think she would choose a trach; however, he does not feel that he can make a decision to transition to comfort measures. He tells me "I cannot kill her". We discuss this further. We discuss that right now, without advanced medical intervention, Ms Thomassen would have likely already passed away - currently medical equipment is keeping her alive. We discuss that if the decision is made to not continue aggressive care this would only be allowing the natural disease process to occur.   We again review expected outcomes - Ms. Werntz' likely dependence on others for ADLs. Likely need to live in a facility for assistance. He again states this is not a way Ms. Mcquitty would want to live.   Encouraged [name] to consider DNR status understanding evidenced based  poor outcomes in similar hospitalized patients, as the cause of the arrest is likely associated with chronic/terminal disease rather than a reversible acute cardio-pulmonary event. He tells me he will consider this.  [name] asks about additional support for him as he deals with the emotional toll of this situation. Will refer to spiritual care.  He also asks about initiating medicaid application as we plan for ?LTACH stay. Will refer to TOC.   [name] has my number and knows to call with any further questions or concerns.  Recommendations/Plan: Decision maker [name] is struggling with very difficult decision and asking for additional support - at this time, he plans to move forward with trach to allow for more time. He has legitimate concerns about if patient would want to move forward with trach/her quality of life DNR discussed/recommended - he is considering Spiritual care referral for support for [place] CSW referral to discuss LTC options/medicaid application with [name] Will follow  Goals of Care and Additional Recommendations: Limitations on Scope of Treatment: Full Scope Treatment  Code Status: Full code  Prognosis:  Unable to determine  Discharge Planning: To Be Determined  Care plan was discussed with Dr. Erlinda Hong, RN, patient's brother  Thank you for allowing the Palliative Medicine Team to assist in the care of this patient.   Total Time 50 minutes Prolonged Time Billed  no       Greater than 50%  of this time was spent counseling and coordinating care related to the above assessment and plan.  Juel Burrow, DNP, [facility] Palliative Medicine Team Team Phone # [phone]  Pager [phone]

## 2021-05-25 ENCOUNTER — Inpatient Hospital Stay (HOSPITAL_COMMUNITY): Payer: Medicaid Other

## 2021-05-25 DIAGNOSIS — I5022 Chronic systolic (congestive) heart failure: Secondary | ICD-10-CM | POA: Diagnosis present

## 2021-05-25 DIAGNOSIS — J15211 Pneumonia due to Methicillin susceptible Staphylococcus aureus: Secondary | ICD-10-CM | POA: Diagnosis not present

## 2021-05-25 DIAGNOSIS — Z930 Tracheostomy status: Secondary | ICD-10-CM

## 2021-05-25 DIAGNOSIS — D219 Benign neoplasm of connective and other soft tissue, unspecified: Secondary | ICD-10-CM

## 2021-05-25 DIAGNOSIS — G936 Cerebral edema: Secondary | ICD-10-CM | POA: Diagnosis not present

## 2021-05-25 DIAGNOSIS — G9340 Encephalopathy, unspecified: Secondary | ICD-10-CM

## 2021-05-25 DIAGNOSIS — J9601 Acute respiratory failure with hypoxia: Secondary | ICD-10-CM | POA: Diagnosis not present

## 2021-05-25 DIAGNOSIS — D500 Iron deficiency anemia secondary to blood loss (chronic): Secondary | ICD-10-CM | POA: Diagnosis present

## 2021-05-25 DIAGNOSIS — I639 Cerebral infarction, unspecified: Secondary | ICD-10-CM | POA: Diagnosis not present

## 2021-05-25 DIAGNOSIS — R509 Fever, unspecified: Secondary | ICD-10-CM | POA: Diagnosis not present

## 2021-05-25 LAB — CULTURE, BLOOD (ROUTINE X 2): Special Requests: ADEQUATE

## 2021-05-25 LAB — BASIC METABOLIC PANEL
Anion gap: 5 (ref 5–15)
BUN: 25 mg/dL — ABNORMAL HIGH (ref 6–20)
CO2: 24 mmol/L (ref 22–32)
Calcium: 9.2 mg/dL (ref 8.9–10.3)
Chloride: 121 mmol/L — ABNORMAL HIGH (ref 98–111)
Creatinine, Ser: 1.45 mg/dL — ABNORMAL HIGH (ref 0.44–1.00)
GFR, Estimated: 45 mL/min — ABNORMAL LOW (ref >60)
Glucose, Bld: 104 mg/dL — ABNORMAL HIGH (ref 70–99)
Potassium: 4.2 mmol/L (ref 3.5–5.1)
Sodium: 150 mmol/L — ABNORMAL HIGH (ref 135–145)

## 2021-05-25 LAB — GLUCOSE, CAPILLARY
Glucose-Capillary: 94 mg/dL (ref 70–99)
Glucose-Capillary: 94 mg/dL (ref 70–99)
Glucose-Capillary: 88 mg/dL (ref 70–99)
Glucose-Capillary: 92 mg/dL (ref 70–99)
Glucose-Capillary: 128 mg/dL — ABNORMAL HIGH (ref 70–99)

## 2021-05-25 LAB — CBC
HCT: 26.4 % — ABNORMAL LOW (ref 36.0–46.0)
Hemoglobin: 7.6 g/dL — ABNORMAL LOW (ref 12.0–15.0)
MCH: 21.5 pg — ABNORMAL LOW (ref 26.0–34.0)
MCHC: 28.8 g/dL — ABNORMAL LOW (ref 30.0–36.0)
MCV: 74.8 fL — ABNORMAL LOW (ref 80.0–100.0)
Platelets: 293 10*3/uL (ref 150–400)
RBC: 3.53 MIL/uL — ABNORMAL LOW (ref 3.87–5.11)
RDW: 23.9 % — ABNORMAL HIGH (ref 11.5–15.5)
WBC: 18.1 10*3/uL — ABNORMAL HIGH (ref 4.0–10.5)
nRBC: 0.3 % — ABNORMAL HIGH (ref 0.0–0.2)

## 2021-05-25 IMAGING — DX DG CHEST 1V PORT
1 series · 1 of 1 positions shown · non-contrast
Comparison: [DATE]

CLINICAL DATA: Status post tracheostomy.

EXAM:
PORTABLE CHEST 1 VIEW

[chest]
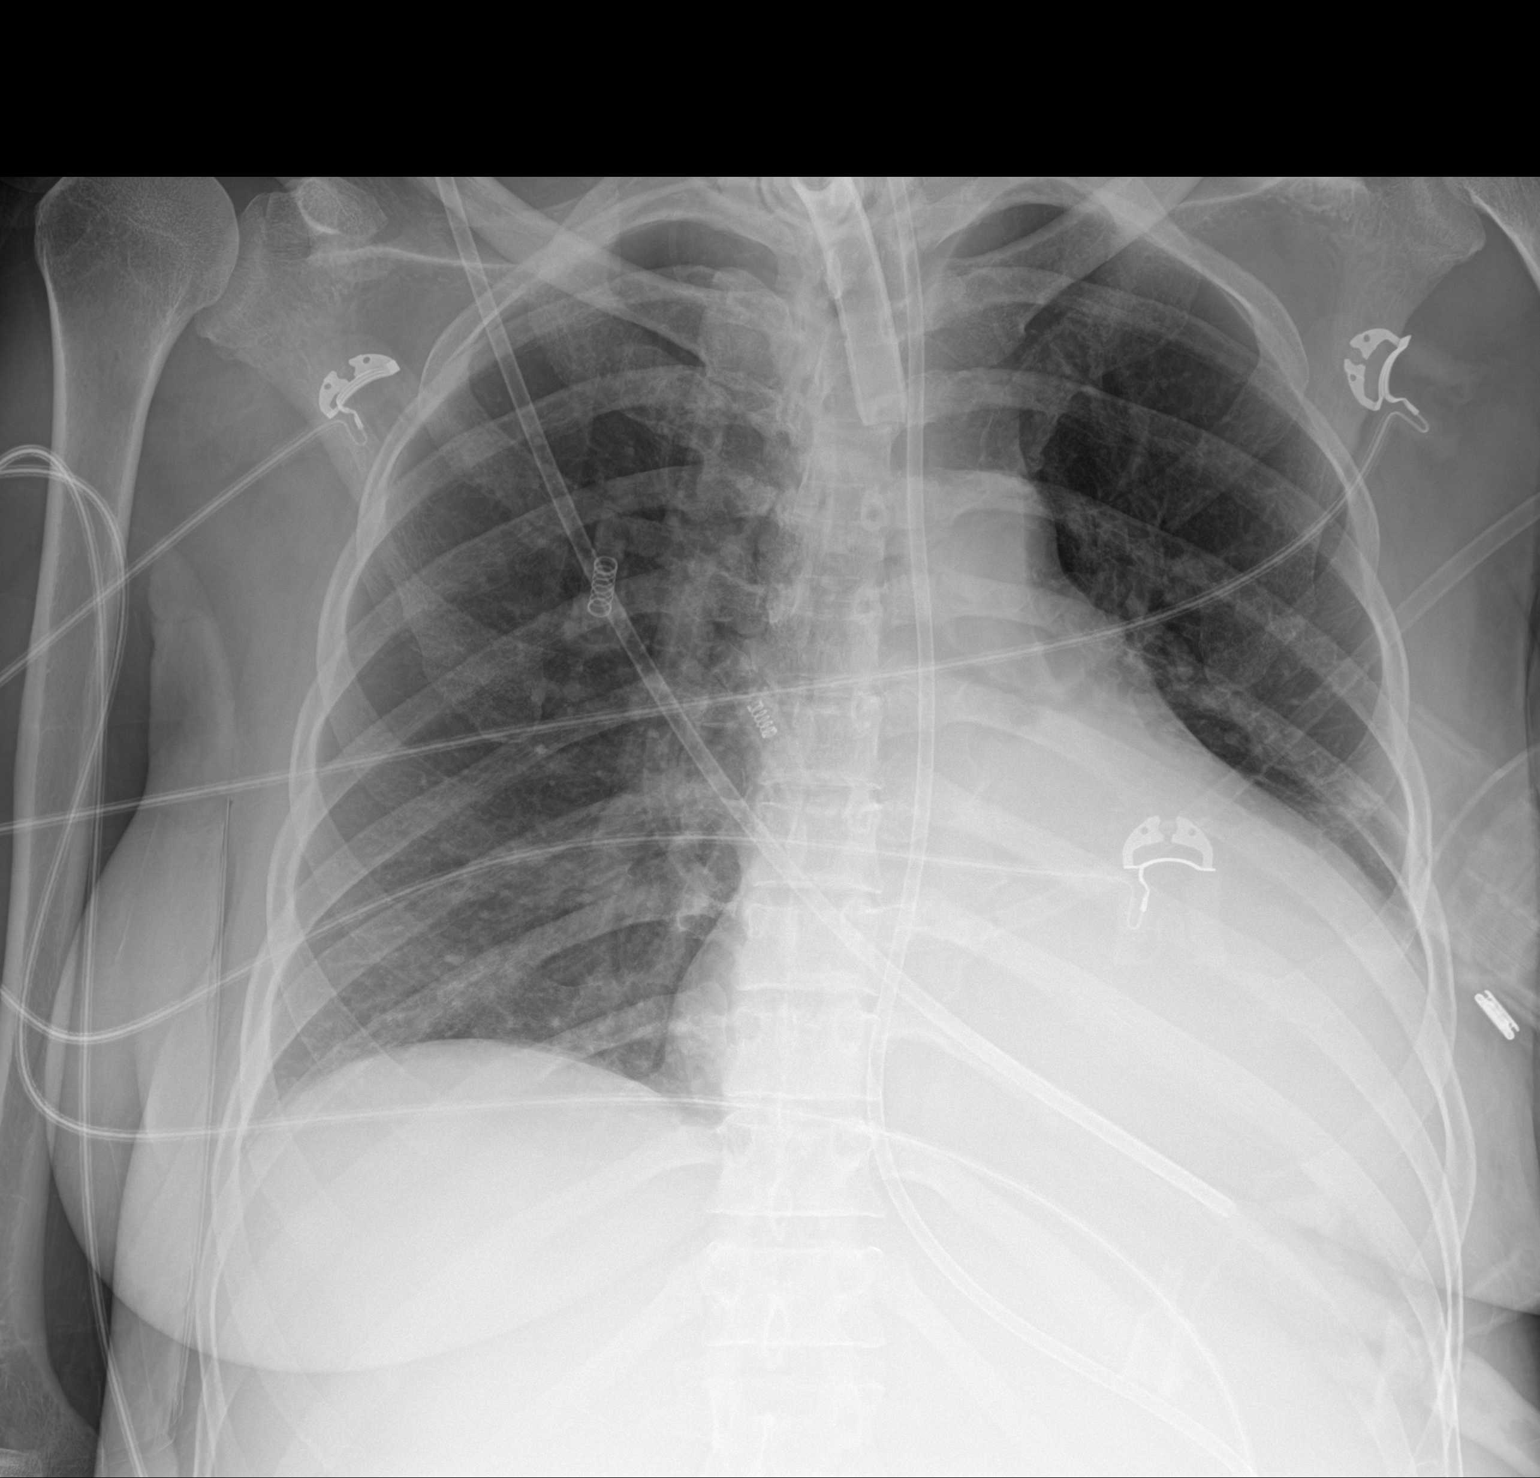

[1 of 1 positions shown; findings below may reference images not displayed]

FINDINGS: Interval placement of tracheostomy tube with tip approximately 4 cm
above the carina. No pneumothorax or pneumomediastinum. Right lung
remains clear. Cardiopericardial silhouette is enlarged. Left base
collapse/consolidation is similar to prior. A feeding tube passes
into the stomach although the distal tip position is not included on
the film. Telemetry leads overlie the chest.
IMPRESSION: Interval placement of tracheostomy tube with tip approximately 4 cm
above the carina.

## 2021-05-25 MED ORDER — FENTANYL CITRATE (PF) 100 MCG/2ML IJ SOLN: 100.0000 ug | Freq: Once | INTRAMUSCULAR | Status: AC

## 2021-05-25 MED ORDER — FREE WATER
200.0000 mL | Status: DC
  Administered 2021-05-25 – 2021-05-29 (×23): 200 mL

## 2021-05-25 MED ORDER — MIDAZOLAM HCL 2 MG/2ML IJ SOLN
INTRAMUSCULAR | Status: AC
  Filled 2021-05-25: qty 6

## 2021-05-25 MED ORDER — FENTANYL CITRATE (PF) 100 MCG/2ML IJ SOLN
100.0000 ug | Freq: Once | INTRAMUSCULAR | Status: AC
  Administered 2021-05-25: 100 ug via INTRAVENOUS

## 2021-05-25 MED ORDER — CLONIDINE HCL 0.1 MG PO TABS
0.1000 mg | ORAL_TABLET | Freq: Three times a day (TID) | ORAL | Status: AC
  Administered 2021-05-25 – 2021-05-28 (×9): 0.1 mg
  Filled 2021-05-25 (×9): qty 1

## 2021-05-25 MED ORDER — PROPOFOL 10 MG/ML IV BOLUS
100.0000 mg | Freq: Once | INTRAVENOUS | Status: AC
  Administered 2021-05-25: 100 mg via INTRAVENOUS

## 2021-05-25 MED ORDER — PROPOFOL 10 MG/ML IV BOLUS
50.0000 mg | Freq: Once | INTRAVENOUS | Status: AC
  Administered 2021-05-25: 50 mg via INTRAVENOUS

## 2021-05-25 MED ORDER — SACUBITRIL-VALSARTAN 97-103 MG PO TABS
1.0000 | ORAL_TABLET | Freq: Two times a day (BID) | ORAL | Status: DC
  Administered 2021-05-25 – 2021-06-21 (×54): 1
  Filled 2021-05-25 (×56): qty 1

## 2021-05-25 MED ORDER — FENTANYL CITRATE (PF) 100 MCG/2ML IJ SOLN
INTRAMUSCULAR | Status: AC
  Filled 2021-05-25: qty 2

## 2021-05-25 MED ORDER — FENTANYL CITRATE (PF) 100 MCG/2ML IJ SOLN
INTRAMUSCULAR | Status: AC
  Administered 2021-05-25: 100 ug via INTRAVENOUS
  Filled 2021-05-25: qty 2

## 2021-05-25 NOTE — Procedures (Signed)
Percutaneous Tracheostomy Procedure Note  Holly Bradley  [id number]  [birth year]  Date:05/25/21  Time:12:29 PM   Provider Performing:Maurion Walkowiak  Procedure: Percutaneous Tracheostomy with Bronchoscopic Guidance ([id number])  Indication(s) Inability to protect airway following stroke.  Consent Risks of the procedure as well as the alternatives and risks of each were explained to the patient and/or caregiver.  Consent for the procedure was obtained.  Anesthesia Etomidate, Versed, Fentanyl, Vecuronium   Time Out Verified patient identification, verified procedure, site/side was marked, verified correct patient position, special equipment/implants available, medications/allergies/relevant history reviewed, required imaging and test results available.   Sterile Technique Maximal sterile technique including sterile barrier drape, hand hygiene, sterile gown, sterile gloves, mask, hair covering.    Procedure Description Appropriate anatomy identified by palpation.  Patient's neck prepped and draped in sterile fashion.  1% lidocaine with epinephrine was used to anesthetize skin overlying neck.  1.5cm incision made and blunt dissection performed until tracheal rings could be easily palpated.   Then a size 6 Shiley tracheostomy was placed under bronchoscopic visualization using usual Seldinger technique and serial dilation.   Bronchoscope confirmed placement above the carina.  Tracheostomy was sutured in place with adhesive pad to protect skin under pressure.    Patient connected to ventilator.   Complications/Tolerance None; patient tolerated the procedure well.  Intraprocedural bleeding controlled by direct pressure. Chest X-ray is ordered to confirm no post-procedural complication.   EBL Minimal   Specimen(s) None

## 2021-05-25 NOTE — Progress Notes (Signed)
OT Cancellation Note  Patient Details Name: Holly Bradley MRN: [id number] DOB: Oct 16, 1976   Cancelled Treatment:    Reason Eval/Treat Not Completed: Patient at procedure or test/ unavailable (getting trach)  Billey Chang, OTR/L  Acute Rehabilitation Services Pager: [phone] Office: [phone] .  05/25/2021, 8:56 AM

## 2021-05-25 NOTE — Progress Notes (Signed)
Physical Therapy Treatment Patient Details Name: Holly Bradley MRN: [id number] DOB: [birth year] Today's Date: 05/25/2021    History of Present Illness 45 yo female s/p mechanical thrombectomy on 5/24 of right MCA M1/M2/M3 and embolization of right MCA M3 branch due to contrast extravasation. Pt re-intubated 5/26. Repeat head CT on 5/26 showed increased cytotoxic edema and mildly increased leftward midline shift, now 5 mm, decreased SAH and IVH. Abdominal CT showing 22.9 cm mass, possibly consistent with uterine fibroids, no lymphadenopathy. PMH fibriods HTN HF paranoid psychosis recent hospitalizations january and february 2022    PT Comments    The pt presents with lethargy this afternoon after placement of trach this morning. The pt was able to follow simple command "wiggle your toes", but unable to follow simple commands for other joints or movements ("kick your leg" or "open your eyes") at this time. The pt continues to present with increased tone in LLE and prefers hip ER with knee flexion. The pt tolerated PROM at all LE joints as well as figure 4 stretch to facilitate improved movement of hips this session without grimacing or change in vitals. The pt was left in chair position with VSS. Will continue to benefit from skilled PT acutely to attempt to progress command following ad tolerance for transitions to sitting EOB and active participation in strengthening exercises.    Follow Up Recommendations  LTACH;SNF     Equipment Recommendations  Wheelchair (measurements PT);Wheelchair cushion (measurements PT);Hospital bed;Other (comment) (hoyer lift)    Recommendations for Other Services       Precautions / Restrictions Precautions Precautions: Fall;Other (comment) Precaution Comments: L hemiparesis, PRAFO, hand splint, vent trach, SBP < 160, R mitten Restrictions Weight Bearing Restrictions: No    Mobility  Bed Mobility Overal bed mobility: Needs Assistance   Rolling: Total  assist;+2 for safety/equipment         General bed mobility comments: totalA of 2 to repositioning    Transfers                 General transfer comment: not appropriate at this time due to arousal     Modified Rankin (Stroke Patients Only) Modified Rankin (Stroke Patients Only) Pre-Morbid Rankin Score: No symptoms Modified Rankin: Severe disability     Balance Overall balance assessment: Needs assistance   Sitting balance-Leahy Scale: Zero Sitting balance - Comments: dependent on support from bed to maintain, assist for head positioning                                    Cognition Arousal/Alertness: Lethargic Behavior During Therapy: Flat affect Overall Cognitive Status: Difficult to assess Area of Impairment: Following commands;Safety/judgement;Awareness;Problem solving                       Following Commands: Follows one step commands inconsistently (only simple commands such as wiggle toes) Safety/Judgement: Decreased awareness of safety;Decreased awareness of deficits Awareness: Intellectual Problem Solving: Slow processing;Decreased initiation;Difficulty sequencing;Requires verbal cues;Requires tactile cues General Comments: pt following simple commands such as wiggle your toes, unable to follow any other simple commands at other joints at this time, unable to maintain eyes open to command.      Exercises General Exercises - Upper Extremity Shoulder Flexion: PROM;Left;5 reps Elbow Flexion: PROM;Both;10 reps;Supine Elbow Extension: PROM;Both;10 reps;Supine General Exercises - Lower Extremity Ankle Circles/Pumps: PROM;Both;10 reps;Supine Short Arc Quad: PROM;Both;10 reps;Supine Heel Slides: PROM;Both;10 reps;Supine Other Exercises  Other Exercises: Hip IR/ER x10 bilateral LE Other Exercises: figure 4 stretch LLE 5 x 15 seconds    General Comments General comments (skin integrity, edema, etc.): VSS on vent through session.  repositioned LLE to maintain neutral positioning      Pertinent Vitals/Pain Pain Assessment: Faces Faces Pain Scale: No hurt Pain Location: localized but delayed to noxious stimuli Pain Descriptors / Indicators: Grimacing Pain Intervention(s): Limited activity within patient's tolerance;Monitored during session           PT Goals (current goals can now be found in the care plan section) Acute Rehab PT Goals Patient Stated Goal: none stated PT Goal Formulation: Patient unable to participate in goal setting Time For Goal Achievement: 06/03/21 Potential to Achieve Goals: Fair Progress towards PT goals: Progressing toward goals    Frequency    Min 3X/week      PT Plan Current plan remains appropriate       AM-PAC PT "6 Clicks" Mobility   Outcome Measure  Help needed turning from your back to your side while in a flat bed without using bedrails?: Total Help needed moving from lying on your back to sitting on the side of a flat bed without using bedrails?: Total Help needed moving to and from a bed to a chair (including a wheelchair)?: Total Help needed standing up from a chair using your arms (e.g., wheelchair or bedside chair)?: Total Help needed to walk in hospital room?: Total Help needed climbing 3-5 steps with a railing? : Total 6 Click Score: 6    End of Session Equipment Utilized During Treatment: Oxygen (vent trach) Activity Tolerance: Patient limited by lethargy Patient left: in bed;with call bell/phone within reach;with bed alarm set;with restraints reapplied;with SCD's reapplied Nurse Communication: Mobility status PT Visit Diagnosis: Hemiplegia and hemiparesis;Other abnormalities of gait and mobility (R26.89);Unsteadiness on feet (R26.81);Muscle weakness (generalized) (M62.81);Difficulty in walking, not elsewhere classified (R26.2);Other symptoms and signs involving the nervous system (R29.898);Apraxia (R48.2) Hemiplegia - Right/Left: Left Hemiplegia -  dominant/non-dominant: Non-dominant Hemiplegia - caused by: Cerebral infarction;Nontraumatic SAH;Nontraumatic intracerebral hemorrhage     Time: 8882-8003 PT Time Calculation (min) (ACUTE ONLY): 17 min  Charges:  $Therapeutic Exercise: 8-22 mins                     Karma Ganja, PT, DPT   Acute Rehabilitation Department Pager #: [phone]   Otho Bellows 05/25/2021, 3:16 PM

## 2021-05-25 NOTE — Progress Notes (Signed)
Nutrition Follow-up  DOCUMENTATION CODES:   Not applicable  INTERVENTION:    Tube feeding via Cortrak: Osmolite 1.5 at 50 ml/h (1200 ml per day) Prosource TF 45 ml BID  Provides 1880 kcal, 97 gm protein, 912 ml free water daily  MVI with minerals  200 ml free water every 4 hours Total free water: 2112 ml    NUTRITION DIAGNOSIS:   Inadequate oral intake related to inability to eat as evidenced by NPO status. Ongoing.   GOAL:   Patient will meet greater than or equal to 90% of their needs Met with TF.   MONITOR:   TF tolerance,Labs  REASON FOR ASSESSMENT:   Consult Enteral/tube feeding initiation and management  ASSESSMENT:   Pt with PMH of uncontrolled HTN, tobacco abuse, uterine fibroids, CHF, paroxysmal SVT, and paranoid psychosis admitted with significant encephalopathy and L sided weakness. 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   Pt discussed during ICU rounds and with RN.  Palliative working with family for goals of care  5/25 pt failed swallow eval; cortrak placed with tip gastric 5/26 pt intubated  6/3 s/p trach placement   Patient is currently intubated on ventilator support MV: 10.6 L/min Temp (24hrs), Avg:99.8 F (37.7 C), Min:98.8 F (37.1 C), Max:101.3 F (38.5 C)   Medications reviewed and include: colace, megace 80 mg TID, MVI with minerals, protonix, miralax Labs reviewed: Na 150     Diet Order:   Diet Order            Diet NPO time specified  Diet effective midnight                 EDUCATION NEEDS:   No education needs have been identified at this time  Skin:  Skin Assessment: Reviewed RN Assessment  Last BM:  unknown  Height:   Ht Readings from Last 1 Encounters:  05/15/21 '5\' 5"'  (1.651 m)    Weight:   Wt Readings from Last 1 Encounters:  05/25/21 76 kg    Ideal Body Weight:     BMI:  Body mass index is 27.88 kg/m.  Estimated Nutritional  Needs:   Kcal:  1850-2100  Protein:  90-105 grams  Fluid:  >1.8 L/day  Lockie Pares., RD, LDN, CNSC See [facility] for contact information

## 2021-05-25 NOTE — Procedures (Signed)
Bedside Tracheostomy Insertion Procedure Note   Patient Details:   Name: Holly Bradley DOB: [birth year] MRN: [id number]  Procedure: Tracheostomy  Pre Procedure Assessment: ET Tube Size: ET Tube secured at lip (cm): Bite block in place: No Breath Sounds: Clear and Diminished  Post Procedure Assessment: BP 125/67   Pulse 94   Temp 99.4 F (37.4 C) (Axillary)   Resp 20   Ht 5\' 5"  (1.651 m)   Wt 76 kg   LMP  (LMP Unknown) Comment: pt intubated  SpO2 100%   BMI 27.88 kg/m  O2 sats: stable throughout Complications: No apparent complications Patient did tolerate procedure well Tracheostomy Brand:Shiley Tracheostomy Style:Cuffed Tracheostomy Size: 6 Tracheostomy Secured WOE:[id number] Tracheostomy Placement Confirmation:Trach cuff visualized and in place    Ander Purpura 05/25/2021, 9:58 AM

## 2021-05-25 NOTE — Progress Notes (Signed)
SLP Cancellation Note  Patient Details Name: Onisha Cedeno MRN: [id number] DOB: 10/21/1976   Cancelled treatment:        Received trach 15 min ago. ST will follow.    [name] 05/25/2021, 10:02 AM

## 2021-05-25 NOTE — Procedures (Signed)
Diagnostic Bronchoscopy Procedure Note   Holly Bradley  [id number]  07-07-1976  Date:05/25/21  Time:9:53 AM   Provider Performing:Holly Bradley  Procedure: Diagnostic Bronchoscopy (93267)  Indication(s) Assist with direct visualization of tracheostomy placement  Consent Risks of the procedure as well as the alternatives and risks of each were explained to the patient and/or caregiver.  Consent for the procedure was obtained.   Anesthesia See separate tracheostomy note.    Time Out Verified patient identification, verified procedure, site/side was marked, verified correct patient position, special equipment/implants available, medications/allergies/relevant history reviewed, required imaging and test results available.   Sterile Technique Usual hand hygiene, masks, gowns, and gloves were used   Procedure Description Bronchoscope advanced through endotracheal tube and into airway.  After suctioning out tracheal secretions, bronchoscope used to provide direct visualization of tracheostomy placement.   Complications/Tolerance None; patient tolerated the procedure well..   EBL None   Specimen(s) None   Holly Gum MSN, AGACNP-BC [place] for pager details  05/25/2021, 9:54 AM

## 2021-05-25 NOTE — Progress Notes (Signed)
PT Cancellation Note  Patient Details Name: Holly Bradley MRN: [id number] DOB: [birth year]   Cancelled Treatment:    Reason Eval/Treat Not Completed: Patient at procedure or test/unavailable this morning. Upon PT arrival, pt being prepped for placement of trach, PT will continue to follow and attempt session as time/schedule allows.   Holly Bradley, PT, DPT   Acute Rehabilitation Department Pager #: [phone]   Otho Bellows 05/25/2021, 9:13 AM

## 2021-05-25 NOTE — Progress Notes (Signed)
This chaplain responded to PMT consult for family spiritual care.  The chaplain phoned the Pt. RN-Jennifer and understands the Pt. brother-[name] is out of town for the weekend.  This chaplain will F/U with PMT and [name] on Monday.  Please page Spiritual Care as chaplain needs may arise.

## 2021-05-25 NOTE — Progress Notes (Signed)
STROKE TEAM PROGRESS NOTE   INTERVAL HISTORY No family at the bedside. Pt just post trach by Dr. Lynetta Mare. Neuro stable, mildly lethargic after procedure but still following commands on the right after arouse with painful stimulation. Hb stable.    Vitals:   05/25/21 0959 05/25/21 1000 05/25/21 1001 05/25/21 1002  BP:  118/63    Pulse: 91 91 90 90  Resp: 20 20 20 20   Temp:      TempSrc:      SpO2: 100% 100% 100% 100%  Weight:      Height:       CBC:  Recent Labs  Lab 05/24/21 0445 05/25/21 0235  WBC 15.4* 18.1*  HGB 7.3* 7.6*  HCT 25.5* 26.4*  MCV 74.8* 74.8*  PLT 265 426   Basic Metabolic Panel:  Recent Labs  Lab 05/24/21 0445 05/24/21 1016 05/25/21 0235  NA 150* 150* 150*  K 4.2  --  4.2  CL 116*  --  121*  CO2 24  --  24  GLUCOSE 124*  --  104*  BUN 24*  --  25*  CREATININE 1.43*  --  1.45*  CALCIUM 9.0  --  9.2   Lipid Panel:  Recent Labs  Lab 05/24/21 0447  TRIG 81   HgbA1c:  No results for input(s): HGBA1C in the last 168 hours. Urine Drug Screen:  No results for input(s): LABOPIA, COCAINSCRNUR, LABBENZ, AMPHETMU, THCU, LABBARB in the last 168 hours.  Alcohol Level  No results for input(s): ETH in the last 168 hours.  IMAGING  CT HEAD WO CONTRAST Result Date: 05/16/2021  IMPRESSION:  1. Continued interval evolution of moderately large right MCA distribution infarct. Associated regional mass effect has mildly increased, with new 2 mm of right-to-left midline shift. No hydrocephalus or trapping.  2. Decreased prominence of subarachnoid hemorrhage at the posterior right Sylvian fissure and overlying right frontoparietal region. Trace intraventricular hemorrhage related to redistribution.  3. Question new area of parenchymal hypodensity involving the lateral left cerebellum. While this finding is favored to be artifactual, a possible new acute ischemic infarct is difficult to exclude, and could be considered in the correct clinical setting. Attention at  follow-up recommended.  4. No other new acute intracranial abnormality.   CT HEAD WO CONTRAST Result Date: 05/15/2021  IMPRESSION: Small-to-moderate volume hyperdensity within the right sylvian fissure and along portions of the right frontoparietal and temporal lobes compatible with extravasated contrast/subarachnoid hemorrhage. There is hyperdensity within the right basal ganglia, right insula, lateral right frontal lobe/frontal operculum and within portions of the right parietal lobe likely reflecting contrast staining at sites of acute infarction. Mild associated mass effect with subtle partial effacement of the right lateral ventricle. No midline shift or hydrocephalus. Embolization material now overlies the lateral right parietal lobe. Stable background parenchymal atrophy and chronic small vessel ischemic disease. Redemonstrated chronic infarct within the superior left cerebellum.   MR BRAIN WO CONTRAST Result Date: 05/15/2021 IMPRESSION:  1. Moderately large acute right MCA infarct. Associated petechial hemorrhage, subarachnoid hemorrhage, and minimal intraventricular hemorrhage.  2. Severe chronic small vessel ischemic disease with multiple chronic infarcts.   MR CERVICAL SPINE WO CONTRAST Result Date: 05/15/2021  IMPRESSION: The cord itself appears normal. No disc disease. No spinal stenosis. 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 No significant encroachment upon the neural spaces. Some prevertebral soft tissue edema. In this clinical scenario, this may be subsequent to previous endotracheal intubation. One could not rule out pharyngitis or cellulitis  but that seems less likely. I do not see any traumatic injury to the cervical spine itself. Electronically Signed   By: Nelson Chimes M.D.   On: 05/15/2021 16:05   IR CT Head Ltd Result Date: 05/15/2021 CT of the head was obtained and post processed in a separate workstation with concurrent attending physician supervision.  Selected images were sent to PACS. Small right sylvian contrast extravasation confirmed. Additionally, hyperdensity of the right basal ganglia and lateral right frontal cortex are noted, related to ongoing ischemia. Right internal carotid artery angiograms with frontal and lateral views of the head showed occlusion of the embolized branch with no evidence of contrast extravasation. Patency of the remaining MCA branches noted.  IR PERCUTANEOUS ART THROMBECTOMY/INFUSION INTRACRANIAL INC DIAG ANGIO Result Date: 05/15/2021 IMPRESSION: Mechanical thrombectomy performed for treatment of right M1/MCA occlusion with clot fragmentation and embolization to same distal territory requiring multiple additional passes. Procedure complicated by contrast extravasation at a distal right M3/MCA branch requiring vessel embolization. Near complete recanalization of the right MCA vascular tree was achieved (TICI 2b). PLAN: - Bed rest post femoral access x6h - Follow-up CT in 4 hours to evaluate for SAH/contrast extravasation stability - Transfer to ICU- SBP: 120-140 mmHg   Echo Result read: 05/16/2021 1. Left ventricular ejection fraction, by estimation, is 55 to 60%. The  left ventricle has normal function. The left ventricle has no regional  wall motion abnormalities. There is moderate concentric left ventricular  hypertrophy. Left ventricular  diastolic parameters are consistent with Grade II diastolic dysfunction  (pseudonormalization). Elevated left atrial pressure.  2. Right ventricular systolic function is normal. The right ventricular  size is normal. Mildly increased right ventricular wall thickness. There  is moderately elevated pulmonary artery systolic pressure. The estimated  right ventricular systolic pressure  is 35.3 mmHg.  3. Left atrial size was severely dilated.  4. Right atrial size was severely dilated.  5. There is no evidence of cardiac tamponade.  6. The mitral valve is normal in  structure. Trivial mitral valve  regurgitation. No evidence of mitral stenosis.  7. The aortic valve is normal in structure. There is mild calcification  of the aortic valve. There is mild thickening of the aortic valve. Aortic  valve regurgitation is not visualized. Mild aortic valve sclerosis is  present, with no evidence of aortic  valve stenosis.  8. The inferior vena cava is dilated in size with <50% respiratory  variability, suggesting right atrial pressure of 15 mmHg.   PHYSICAL EXAM  Temp:  [98.8 F (37.1 C)-101.3 F (38.5 C)] 99.4 F (37.4 C) (06/03 0800) Pulse Rate:  [74-109] 90 (06/03 1002) Resp:  [0-27] 20 (06/03 1002) BP: (118-196)/(62-106) 118/63 (06/03 1000) SpO2:  [100 %] 100 % (06/03 1002) FiO2 (%):  [40 %-100 %] 100 % (06/03 0854) Weight:  [76 kg] 76 kg (06/03 0400)  General - Well nourished, well developed, s/p trach.  Ophthalmologic - fundi not visualized due to noncooperation.  Cardiovascular - Regular rhythm and rate  Neuro - intubated s/p trach, eyes closed, not open with voice stimulation, with painful stimulation she was able to follow commands on the right hand and foot. With forced eye opening, eyes in right forced gaze preference, not blinking to visual threat on the left, not tracking, PERRL. B/l eyelids mildly swollen. Corneal reflex present, gag and cough present. Facial symmetry not able to test due to ET tube.  Tongue protrusion not cooperative. On pain stimulation, left UE and LE flaccid. Right UE  proximal 1/5 and distal finger movement 2+/5 and RLE rigorously movement in bed with stimulation. No babinski. Sensation, coordination not cooperative and gait not tested.   ASSESSMENT/PLAN Ms. Asher Babilonia is a 46 y.o. female with history of uncontrolled HTN, tobacco use disorder, large uterine fibroids, heart failure, paroxysmal SVT, paranoid psychosis who presents with altered mental status after being found down.  CT head initially read as small  acute appearing perforator infarct in the R BG and notable for severe small vessel ischemia. However concerning for potential R MCA stroke and thus CTA head and neck and CTP was obtained with a R MCA M1 occlusion and a core of 50cc and 65ml of penumbra.   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   CTA neck: LVO right M1 origin. Left vertebral occlusion at its origin. High-grade narrowings of the right cavernous ICA.  CT perfusion suggestive 50 cc core infarct and 50 cc of adjacent penumbra.  MRI: large acute right MCA infarct. Associated petechial hemorrhage, subarachnoid hemorrhage, and minimal intraventricular hemorrhage.  CT 5/28 stable right MCA infarct with MLS  CT 5/30 stable right MCA infarct and MLS 54mm  2D Echo 55 to 60%  Further cardioembolic work up once pt neuro improves  UDS: negative  LDL 76  HgbA1c 5.4  UDS neg  Sickle cell screen - neg, ANA neg  VTE prophylaxis - SCDs  No antithrombotic prior to admission, now on ASA 81.  Therapy recommendations:  CIR  Disposition:  Pending  Cytotoxic cerebral edema  On 3% saline @ 30 -> FW 100 Q4  Na 157->156->157->155->157->153->150->150  CT head 5/30 stable right MCA infarcts and MLS 75mm  OK to gradually normalize Na level  Respiratory failure  Intubated 5/26 for increased work of breathing with altered mental status   Management per CCM, appreciated  Switched to precedex -> now off   Not candidate for extubation  S/p trach 6/3 with CCM  Acute blood loss anemia Large uterine fibroids  hgb 7.8->5.9->PRBC->7.6->6.5->PRBC->7.2->6.7->PRBC->8.5->7.7->7.3->7.6  PRBC tranfusion x 3  extensive virginal bleeding from extremely large abdominopelvic mass measuring up to 22.9 cm noted on CT 5/26  Has been followed  with Dr. Jonna Clark - poor surgical candidate due to uncontrolled HTN and SVT  Abdominal distention on exam  Increase Megace to megace 80mg  tid  OBGYN Dr. Rosana Hoes consulted, appreciate help - not surgical candidate  Dysphagia  Likely due to stroke  SLP following  cortrak placement  On TF @ 50 and FW  Paroxysmal SVT history Hx of CHF  Followed with cardiology   EF 40-45% in 04/2020 and 20-25% in 12/2020  This admission EF 55-60%  on amiodarone 200mg  daily and isosorbide 20 tid  Currently heart rate controlled  Hypertension  Home meds:  Hydralazine 100mg  TID, Imdur 60mg  daily, Toprol XL 200mg  daily  Off Cleviprex now  Amlodipine 10mg  daily -> 5mg  bid  clonidine 0.2 tid -> Q8h  Hydralazine 100mg  tid -> q8h  imdur 20mg  tid -> Q8h  Metoprolol 100mg  bid  SBP goal to <160 mmHg  Labetalol prn . Long-term BP goal normotensive  Hyperlipidemia  Home meds:  none  LDL 76, goal < 70  Add atorvastatin 20mg  daily  Continue statin at discharge  Tobacco abuse  Current smoker  Smoking cessation counseling will be provided  Fever  and Leukocytosis  WBC 12.7->14.0->23.1->20.9->20.6->19.1->16.8->15.4->18.1  Tmax 101.9->100.1  Sputum culture - staph Aures   Blood culture G+ cocci in clusters  CXR LLL atx vs. pneumonia  CCM on board  On ancef   Other Stroke Risk Factors  Coronary artery disease  Other Active Problems  AKI: creatinine 1.27->1.48->1.54->1.51->1.52->1.75->1.39->1.43->1.45  Lethargic - added provigil by CCM   hospital day #10  This patient is critically ill due to respiratory failure s/p trach, right MCA infarct with hemorrhagic conversion, bacteremia, severe anemia, leukocytosis and AKI and at significant risk of neurological worsening, death form recurrent stroke, sepsis, shock with severe anemia, cerebral edema. This patient's care requires constant monitoring of vital signs, hemodynamics, respiratory and cardiac monitoring, review  of multiple databases, neurological assessment, discussion with family, other specialists and medical decision making of high complexity. I spent 35 minutes of neurocritical care time in the care of this patient. I discussed with Dr. Lynetta Mare.   Rosalin Hawking, MD PhD Stroke Neurology 05/25/2021 11:27 AM     To contact Stroke Continuity provider, please refer to [email or web address] After hours, contact General Neurology

## 2021-05-25 NOTE — Progress Notes (Signed)
NAME:  Holly Bradley, MRN:  [id number], DOB:  1976-05-27, LOS: 72  ADMISSION DATE:  05/15/2021, CONSULTATION DATE:  05/25/21 REFERRING MD:  Holly Bradley, CHIEF COMPLAINT:  Respiratory failure   History of Present Illness:  [name] is a [name] with PMH of HTN, tobacco use, CHF, DMT2 (controlled with diet and exercise), pSVT, uterine fibroids, paranoid psychosis who presented with AMS on 5/24.   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  On follow up imaging, she had stable appearing SAH and continued R basal ganglia infarct with 20mm R to L shift.   She was admitted to the Neuro ICU and treating with hypertonic saline, statin and BP control.  On 5/26, PCCM consulted when pt became acutely less responsive with respiratory distress along with abdominal distension worse than baseline, per patient.  She was intubated and repeat head CT, abd/pelvis CT ordered.  Repeat head CT on 5/26 showed increased cytotoxic edema and mildly increased leftward midline shift, now 5 mm, decreased SAH and IVH.  Abdominal CT showing 22.9 cm mass, possibly consistent with uterine fibroids, no lymphadenopathy.      Pertinent  Medical History  -CHF -tobacco use -DMT2 -HTN -pSVT -uterine fibroids -paranoid psychosis   Significant Hospital Events: Including procedures, antibiotic start and stop dates in addition to other pertinent events   . 5/24 Admit to neurology after ED visit with AMS, found to have R MCA stroke, underwent mechanical thrombectomy, M3 coil embolization, subsequent SAH . 5/26 decreased mental status and respiratory distress, intubated  . 6/1 still not candidate for extubation with poor mentation and cough mechanics. Family unsure if pt would want trach. Staph epi bacteremia, continues on ancef  . 6/2 moving RUE RLE  following some commands. decision to move forward with tracheostomy.   . 6/3 perc trach. Slight WBC increase but no fever, continues on ancef   Interim History / Subjective:   NAEO  NPO since midnight in preparation for perc trach this morning    WBC incr to 18  Na stable at 150  Cr slight incr to 1.45  Hgb 7.6  Objective   Blood pressure 118/63, pulse 90, temperature 99.4 F (37.4 C), temperature source Axillary, resp. rate 20, height 5\' 5"  (1.651 m), weight 76 kg, SpO2 100 %.    Vent Mode: PRVC FiO2 (%):  [40 %-100 %] 100 % Set Rate:  [20 bmp] 20 bmp Vt Set:  [460 mL] 460 mL PEEP:  [5 cmH20] 5 cmH20 Plateau Pressure:  [5 cmH20-16 cmH20] 13 cmH20   Intake/Output Summary (Last 24 hours) at 05/25/2021 1008 Last data filed at 05/25/2021 0700 Gross per 24 hour  Intake 2770 ml  Output 1240 ml  Net 1530 ml   Filed Weights   05/22/21 0500 05/23/21 0500 05/25/21 0400  Weight: 71.6 kg 70.6 kg 76 kg    General:  Chronically and critically ill appearing middle aged F intubated off sedated NAD  HEENT: NCAT ETT secure. Anicteric sclera. Periorbital edema. Thick slightly yellow oral and nasal secretions  Neuro: R sided gaze, can't cross midline. Moving RUE LUE spontaneously and to command, against gravity. Nods to yes/no.  CV: rrr s1s2 no rgm cap refill brisk 2+ radial pulses  PULM: Mechanically ventilated. Symmetrical chest expansion, even and unlabored on PSV. Some upper lobe rhonchi  GU: Hard, large uterine fibroids. Blood  tinged urine GI: + bowel sounds, non-tender, abdomen other than uterine fibroids is soft  Extremities: No acute joint deformity no cyanosis or clubbing. LUE LLE non-pitting edema   Labs/imaging that I havepersonally reviewed  (right click and "Reselect all SmartList Selections" daily)   CT Head 5/30: Stable R MCA infarct. 5 mm R to L midline shift (no significant change).   6/3 WBC incr to 18  Na stable at 150  Cr slight incr to 1.45  Hgb  7.6   Assessment & Plan:    Acute respiratory failure with hypoxia requiring MV Staph aureus PNA Plan: -trach today 6/3 -routine post-trach care -wean vent as able, hopefully quick progression to ATC  -cont cefazolin through 6/6  Acute encephalopathy  -likely multifactorial in setting of intracranial process below, delirium, infection/metabolic abnormalities  R MCA CVA with SAH midline shift and cerebral edema  Plan: -cont neuro checks  -SBP goal < 160 -provigil -minimize sedation as able  -tx infection as outlined   Sepsis due to staph aureus PNA -possible staph epi bacteremia but more likely this is contaminant  -WBC did bump 6/3 to 18 from 15.4, no associated incr in temp  Plan: -ancef through 6/6  -trend fever curve and wbc   AKI Hypernatremia, mild (iatrogenic, was previously on hypertonic) -was off FWF for trach 6/3  Plan: -FWF 200 ml -trend UOP renal indices   HFrEF  -(EF 20-25% 12/2020) Plan: -amio, isosorbide -icu monitoring   HTN Plan: -SBP goal <160 -Amlodipine, clonidine, hydralazine, metop, PRN labetalol   Large uterine fibroids Acute on chronic anemia, multifactorial  -iron deficiency anemia, ABLA  Plan: -IV iron -transfuse for hgb < 7 -Megace 80mg  TID per OB  -Poor surgical candidate  Hx HLD  Plan: -statin   Goals of Care -Conversation w PCCM NP Holly Bradley and brother 6/2: ---brother serves as Media planner - he is struggling greatly with feelings of guilt and grief-- would like to move forward with trach to facilitate possible further recovery. he is appropriately concerned about qol. We talked about ongoing evaluation of acceptable QOL for the pt, and brother feels that if she does not make meaningful progress toward an acceptable QOL then he would revisit possible comfort measures. He does not want pt to suffer. He has an appropriate understanding of her comorbidities and limitations with fibroids  -Palliative care is also following,  appreciate their help   Best practice (right click and "Reselect all SmartList Selections" daily)  Diet:  Tube Feed , Pain/Anxiety/Delirium protocol (if indicated): Yes (RASS goal 0) PRN fent available if needed VAP protocol (if indicated): Yes DVT prophylaxis: SCD GI prophylaxis: PPI Glucose control:  SSI No Central venous access:  N/A Arterial line:  N/A Foley:  N/A Mobility:  bed rest  PT consulted: Yes Last date of multidisciplinary goals of care discussion [per primary.] Code Status:  full code Disposition: ICU  Labs   CBC: Recent Labs  Lab 05/21/21 0402 05/21/21 2239 05/22/21 0607 05/23/21 0435 05/24/21 0445 05/25/21 0235  WBC 20.6* 19.1*  --  16.8* 15.4* 18.1*  HGB 7.2* 6.7* 8.5* 7.7* 7.3* 7.6*  HCT 24.8* 23.3* 28.5* 26.2* 25.5* 26.4*  MCV 72.5* 71.9*  --  74.0* 74.8* 74.8*  PLT 234 263  --  257 265 115    Basic Metabolic Panel: Recent Labs  Lab 05/21/21 0402 05/21/21 1032 05/21/21 2239 05/22/21 0607 05/23/21 0435 05/23/21 0953 05/23/21 1541 05/23/21 2159 05/24/21 0445 05/24/21 1016 05/25/21 0235  NA 155*   < >  157*   < > 152*   < > 152* 152* 150* 150* 150*  K 4.3  --  4.3  --  4.1  --   --   --  4.2  --  4.2  CL 128*  --  130*  --  118*  --   --   --  116*  --  121*  CO2 21*  --  24  --  24  --   --   --  24  --  24  GLUCOSE 140*  --  134*  --  126*  --   --   --  124*  --  104*  BUN 30*  --  28*  --  24*  --   --   --  24*  --  25*  CREATININE 1.52*  --  1.75*  --  1.39*  --   --   --  1.43*  --  1.45*  CALCIUM 9.1  --  9.0  --  9.3  --   --   --  9.0  --  9.2   < > = values in this interval not displayed.   GFR: Estimated Creatinine Clearance: 48.9 mL/min (A) (by C-G formula based on SCr of 1.45 mg/dL (H)). Recent Labs  Lab 05/21/21 2239 05/23/21 0435 05/24/21 0445 05/25/21 0235  WBC 19.1* 16.8* 15.4* 18.1*    Liver Function Tests: No results for input(s): AST, ALT, ALKPHOS, BILITOT, PROT, ALBUMIN in the last 168 hours.  No results  for input(s): AMMONIA in the last 168 hours.  ABG    Component Value Date/Time   PHART 7.333 (L) 05/17/2021 1817   PCO2ART 33.7 05/17/2021 1817   PO2ART 156 (H) 05/17/2021 1817   HCO3 17.6 (L) 05/17/2021 1817   TCO2 19 (L) 05/17/2021 1817   ACIDBASEDEF 7.0 (H) 05/17/2021 1817   O2SAT 99.0 05/17/2021 1817     HbA1C: Hgb A1c MFr Bld  Date/Time Value Ref Range Status  05/16/2021 04:57 AM 5.4 4.8 - 5.6 % Final    Comment:    (NOTE)         Prediabetes: 5.7 - 6.4         Diabetes: >6.4         Glycemic control for adults with diabetes: <7.0     CBG: Recent Labs  Lab 05/24/21 1501 05/24/21 1921 05/24/21 2310 05/25/21 0304 05/25/21 0719  GLUCAP 110* 116* 119* 94 94     CRITICAL CARE Performed by: Cristal Generous   Total critical care time: 41 minutes  Critical care time was exclusive of separately billable procedures and treating other patients.  Critical care was necessary to treat or prevent imminent or life-threatening deterioration.  Critical care was time spent personally by me on the following activities: development of treatment plan with patient and/or surrogate as well as nursing, discussions with consultants, evaluation of patient's response to treatment, examination of patient, obtaining history from patient or surrogate, ordering and performing treatments and interventions, ordering and review of laboratory studies, ordering and review of radiographic studies, pulse oximetry and re-evaluation of patient's condition.  [name] MSN, AGACNP-BC [place] for pager  05/25/2021, 10:08 AM

## 2021-05-25 NOTE — Progress Notes (Signed)
PCCM communication note  Tried to reach brother, [name] but was directed to VM.    [name] MSN, AGACNP-BC [facility] 05/25/2021, 2:19 PM

## 2021-05-26 ENCOUNTER — Inpatient Hospital Stay (HOSPITAL_COMMUNITY): Payer: Medicaid Other

## 2021-05-26 DIAGNOSIS — G9340 Encephalopathy, unspecified: Secondary | ICD-10-CM | POA: Diagnosis not present

## 2021-05-26 DIAGNOSIS — J15211 Pneumonia due to Methicillin susceptible Staphylococcus aureus: Secondary | ICD-10-CM | POA: Diagnosis not present

## 2021-05-26 DIAGNOSIS — I639 Cerebral infarction, unspecified: Secondary | ICD-10-CM | POA: Diagnosis not present

## 2021-05-26 DIAGNOSIS — J9601 Acute respiratory failure with hypoxia: Secondary | ICD-10-CM | POA: Diagnosis not present

## 2021-05-26 LAB — BASIC METABOLIC PANEL
Anion gap: 10 (ref 5–15)
BUN: 25 mg/dL — ABNORMAL HIGH (ref 6–20)
CO2: 23 mmol/L (ref 22–32)
Calcium: 9.3 mg/dL (ref 8.9–10.3)
Chloride: 114 mmol/L — ABNORMAL HIGH (ref 98–111)
Creatinine, Ser: 1.5 mg/dL — ABNORMAL HIGH (ref 0.44–1.00)
GFR, Estimated: 43 mL/min — ABNORMAL LOW (ref >60)
Glucose, Bld: 118 mg/dL — ABNORMAL HIGH (ref 70–99)
Potassium: 4.1 mmol/L (ref 3.5–5.1)
Sodium: 147 mmol/L — ABNORMAL HIGH (ref 135–145)

## 2021-05-26 LAB — GLUCOSE, CAPILLARY
Glucose-Capillary: 108 mg/dL — ABNORMAL HIGH (ref 70–99)
Glucose-Capillary: 113 mg/dL — ABNORMAL HIGH (ref 70–99)
Glucose-Capillary: 116 mg/dL — ABNORMAL HIGH (ref 70–99)
Glucose-Capillary: 116 mg/dL — ABNORMAL HIGH (ref 70–99)
Glucose-Capillary: 120 mg/dL — ABNORMAL HIGH (ref 70–99)
Glucose-Capillary: 129 mg/dL — ABNORMAL HIGH (ref 70–99)
Glucose-Capillary: 129 mg/dL — ABNORMAL HIGH (ref 70–99)

## 2021-05-26 LAB — CBC
HCT: 26.2 % — ABNORMAL LOW (ref 36.0–46.0)
Hemoglobin: 7.5 g/dL — ABNORMAL LOW (ref 12.0–15.0)
MCH: 21.8 pg — ABNORMAL LOW (ref 26.0–34.0)
MCHC: 28.6 g/dL — ABNORMAL LOW (ref 30.0–36.0)
MCV: 76.2 fL — ABNORMAL LOW (ref 80.0–100.0)
Platelets: 314 10*3/uL (ref 150–400)
RBC: 3.44 MIL/uL — ABNORMAL LOW (ref 3.87–5.11)
RDW: 25.3 % — ABNORMAL HIGH (ref 11.5–15.5)
WBC: 15.5 10*3/uL — ABNORMAL HIGH (ref 4.0–10.5)
nRBC: 0.1 % (ref 0.0–0.2)

## 2021-05-26 IMAGING — DX DG ABD PORTABLE 1V
2 series · 2 of 2 positions shown · non-contrast
Comparison: Portable exam [7Y] hours compared to [DATE]

CLINICAL DATA: Abdominal distension

EXAM:
PORTABLE ABDOMEN - 1 VIEW

[abdomen kub (1 of 2)]
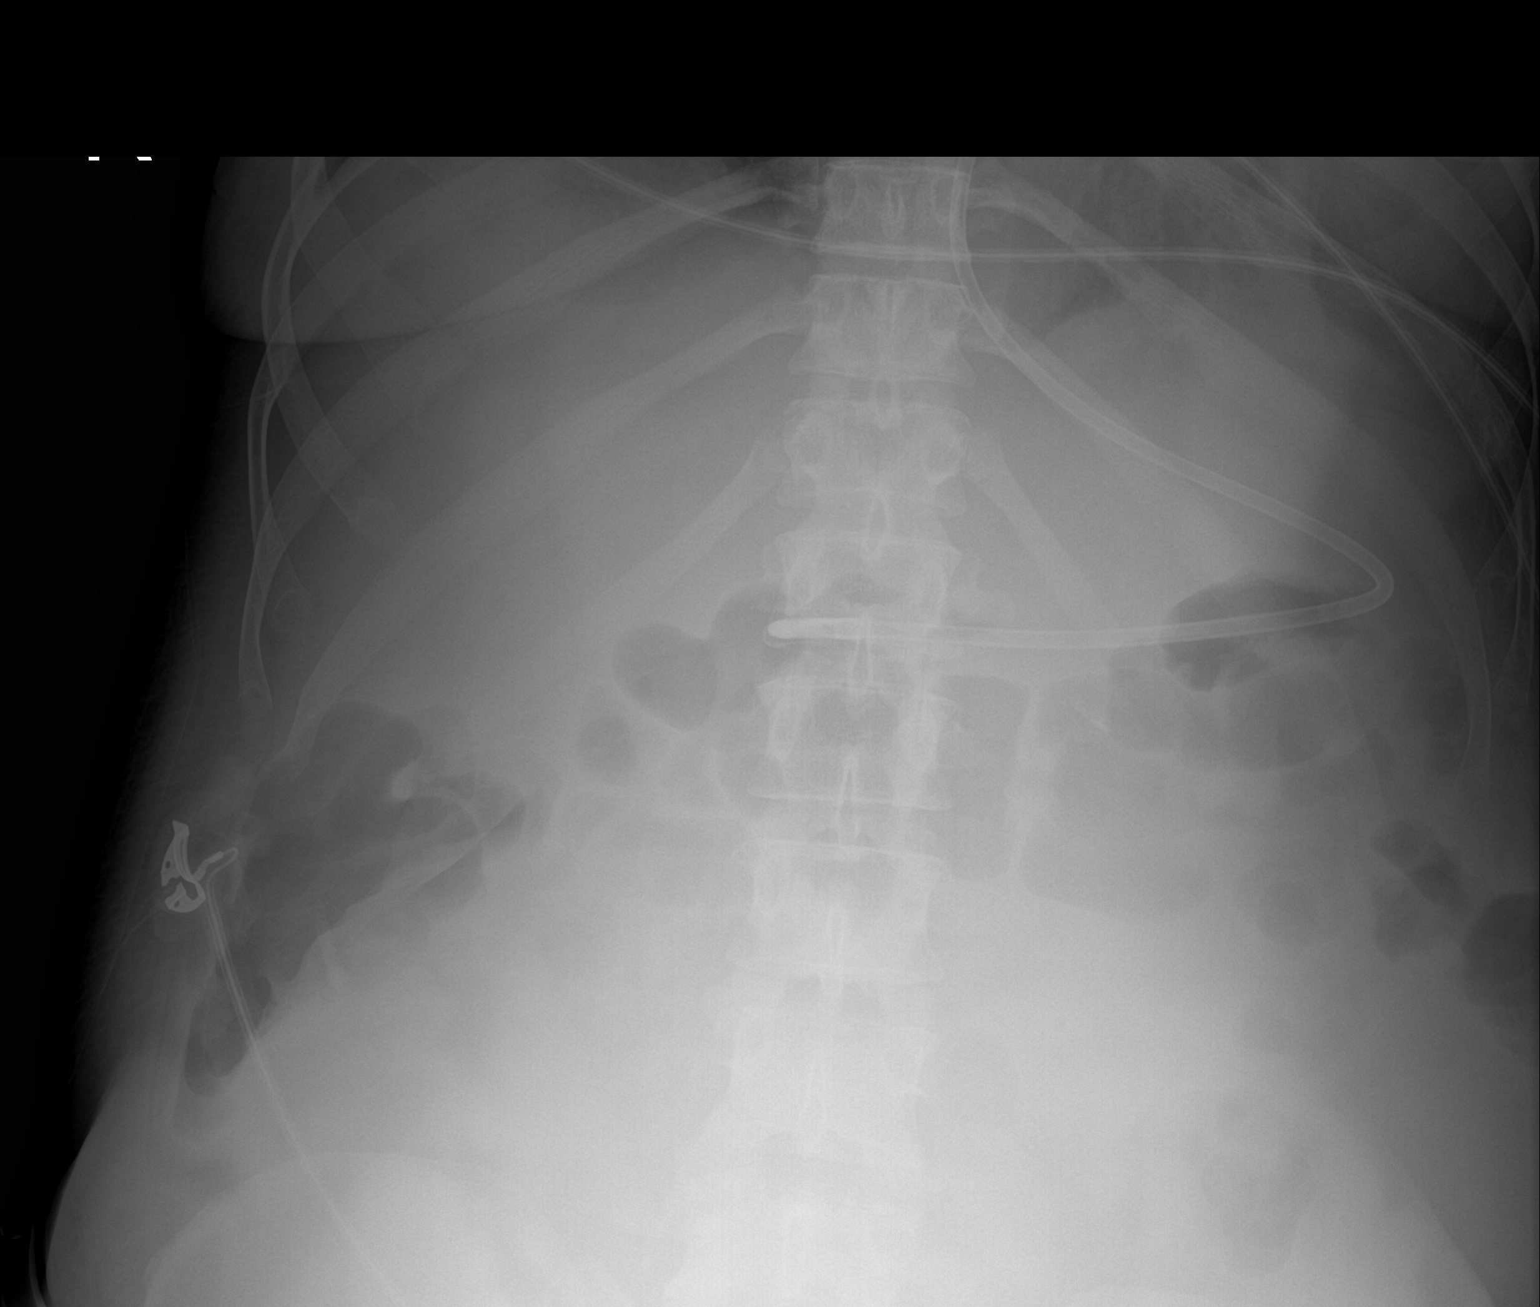

[abdomen kub (2 of 2)]
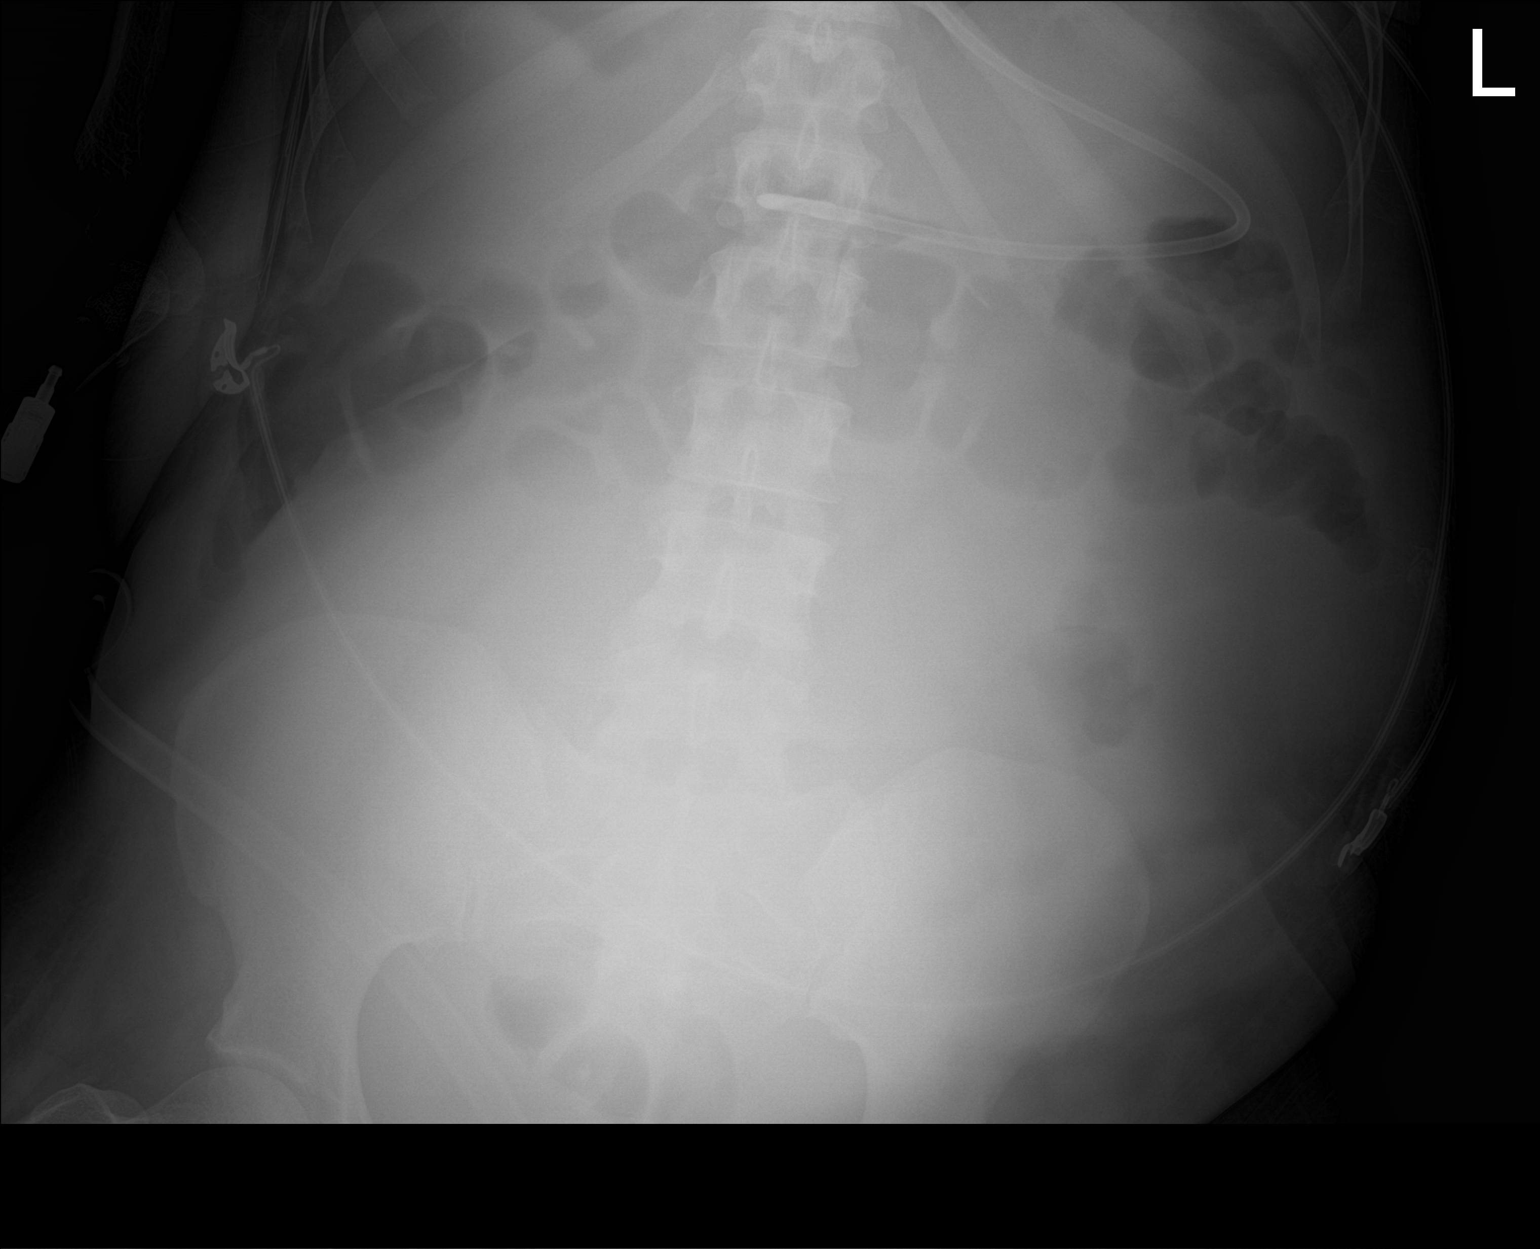

[2 of 2 positions shown; findings below may reference images not displayed]

FINDINGS: Tip of feeding tube projects over distal gastric antrum.

Nonobstructive bowel gas pattern.

Osseous structures unremarkable.

Atelectasis versus consolidation LEFT lower lobe.
IMPRESSION: Tip of feeding tube projects over distal gastric antrum.

Atelectasis versus consolidation LEFT lower lobe.

## 2021-05-26 MED ORDER — SIMETHICONE 40 MG/0.6ML PO SUSP
40.0000 mg | Freq: Four times a day (QID) | ORAL | Status: DC | PRN
  Filled 2021-05-26: qty 0.6

## 2021-05-26 MED ORDER — ATORVASTATIN CALCIUM 40 MG PO TABS
40.0000 mg | ORAL_TABLET | Freq: Every day | ORAL | Status: DC
  Administered 2021-05-26 – 2021-06-21 (×27): 40 mg
  Filled 2021-05-26 (×27): qty 1

## 2021-05-26 MED ORDER — LOPERAMIDE HCL 1 MG/7.5ML PO SUSP
2.0000 mg | ORAL | Status: DC | PRN
  Administered 2021-05-27 – 2021-05-31 (×3): 2 mg
  Filled 2021-05-26 (×6): qty 15

## 2021-05-26 NOTE — Progress Notes (Signed)
   05/26/21 0736  Tracheostomy Shiley Flexible 6 mm Cuffed  Placement Date/Time: 05/25/21 0957   Placed By: ICU physician  Brand: Shiley Flexible  Size (mm): 6 mm  Style: Cuffed  Status Secured  Site Assessment Oozing secretions  Site Care Dressing applied  Ties Assessment Secure  Cuff Pressure (cm H2O) Green OR 18-26 CmH2O  Tracheostomy Equipment at bedside Yes and checklist posted at head of bed  Adult Ventilator Settings  Vent Type Servo i  Humidity HME  Vent Mode CPAP;PSV  FiO2 (%) 40 %  Pressure Support 8 cmH20  PEEP 5 cmH20  Adult Ventilator Measurements  Peak Airway Pressure 13 L/min  Mean Airway Pressure 8 cmH20  Resp Rate Spontaneous 20 br/min  Resp Rate Total 20 br/min  Spont TV 437 mL  Measured Ve 8.7 mL  Auto PEEP 0 cmH20  Total PEEP 5 cmH20  SpO2 99 %  Adult Ventilator Alarms  Alarms On Y  Ve High Alarm 18 L/min  Ve Low Alarm 3 L/min  Resp Rate High Alarm 38 br/min  Resp Rate Low Alarm 8  PEEP Low Alarm 3 cmH2O  Press High Alarm 45 cmH2O  T Apnea 20 sec(s)  VAP Prevention  HME changed No  Ventilator changed No  Transported while on vent No  HOB> 30 Degrees Y  Equipment wiped down Yes  Daily Weaning Assessment  Daily Assessment of Readiness to Wean Wean protocol criteria met (SBT performed)  SBT Method CPAP 5 cm H20 and PS 5 cm H20  Weaning Start Time 0730  Patient response Passed (Tolerated well)  Breath Sounds  Bilateral Breath Sounds Clear  Airway Suctioning/Secretions  Suction Type Tracheal  Suction Device  Catheter  Secretion Amount None  Suction Tolerance Tolerated well  Suctioning Adverse Effects None

## 2021-05-26 NOTE — Progress Notes (Signed)
NAME:  Holly Bradley, MRN:  [id number], DOB:  [birth year], LOS: 81  ADMISSION DATE:  05/15/2021, CONSULTATION DATE:  05/26/21 REFERRING MD:  Leonie Man, CHIEF COMPLAINT:  Respiratory failure   History of Present Illness:  [name] is a [name] with PMH of HTN, tobacco use, CHF, DMT2 (controlled with diet and exercise), pSVT, uterine fibroids, paranoid psychosis who presented with AMS on 5/24.   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  On follow up imaging, she had stable appearing SAH and continued R basal ganglia infarct with 34mm R to L shift.   She was admitted to the Neuro ICU and treating with hypertonic saline, statin and BP control.  On 5/26, PCCM consulted when pt became acutely less responsive with respiratory distress along with abdominal distension worse than baseline, per patient.  She was intubated and repeat head CT, abd/pelvis CT ordered.  Repeat head CT on 5/26 showed increased cytotoxic edema and mildly increased leftward midline shift, now 5 mm, decreased SAH and IVH.  Abdominal CT showing 22.9 cm mass, possibly consistent with uterine fibroids, no lymphadenopathy.      Pertinent  Medical History  -CHF -tobacco use -DMT2 -HTN -pSVT -uterine fibroids -paranoid psychosis   Significant Hospital Events: Including procedures, antibiotic start and stop dates in addition to other pertinent events   . 5/24 Admit to neurology after ED visit with AMS, found to have R MCA stroke, underwent mechanical thrombectomy, M3 coil embolization, subsequent SAH . 5/26 decreased mental status and respiratory distress, intubated  . 6/1 still not candidate for extubation with poor mentation and cough mechanics. Family unsure if pt would want trach. Staph epi bacteremia, continues on ancef  . 6/2 moving RUE RLE  following some commands. decision to move forward with tracheostomy.   . 6/3 perc trach. Slight WBC increase but no fever, continues on ancef  . Tolerating PSV/CPAP   Interim History / Subjective:   NAEO Tolerating PSV well this morning Much more interactive   Objective   Blood pressure 122/66, pulse 80, temperature 97.9 F (36.6 C), temperature source Axillary, resp. rate (!) 0, height 5\' 5"  (1.651 m), weight 72.9 kg, SpO2 100 %.    Vent Mode: CPAP;PSV FiO2 (%):  [40 %] 40 % Set Rate:  [20 bmp] 20 bmp Vt Set:  [460 mL] 460 mL PEEP:  [5 cmH20] 5 cmH20 Pressure Support:  [8 cmH20] 8 cmH20 Plateau Pressure:  [17 cmH20] 17 cmH20   Intake/Output Summary (Last 24 hours) at 05/26/2021 1115 Last data filed at 05/26/2021 1443 Gross per 24 hour  Intake 2475 ml  Output 950 ml  Net 1525 ml   Filed Weights   05/23/21 0500 05/25/21 0400 05/26/21 0500  Weight: 70.6 kg 76 kg 72.9 kg   General:  Ill appearing middle aged F, reclined in bed, trach vent NAD  HEENT: Trach secure. Some pink tinged secretions from trach. Anicteric sclera. Decreasing periorbital edema   Neuro: R sided gaze not crossing midline. Following commands RUE RLE, against gravity. Nodding yes/no.  CV: rrr s1s2 cap refill <3sec  PULM: Symmetrical chest expansion, even and unlabored on PSV  GU: Large hard uterine fibroids. Purewick, blood tinged urine  GI: soft abdomen other than fibroids. Non-tender, + bowel sounds  Extremities: non-pitting edema LUE LLE. No acute joint deformity no cyanosis or clubbing  Psych:  Calm, more engaged/interactive and more euthymic affect than previously with some smiling   Labs/imaging that I havepersonally reviewed  (right click and "Reselect all SmartList Selections" daily)   CT Head 5/30: Stable R MCA infarct. 5 mm R to L midline shift (no significant change).   6/4 BMP- Na improved to 147. Cr 1.5  CBC- WBC 15 hgb 7.5  Assessment & Plan:   Acute respiratory failure with hypoxia S/p  tracheostomy Staph aureus PNA  Plan: -routine post-trach care -wean vent as able, hopefully quick progression to ATC  -cefazolin through 6/6  Acute encephalopathy  -likely multifactorial in setting of intracranial process below, delirium, infection/metabolic abnormalities  R MCA CVA with SAH midline shift cerebral edema Plan: -cont neuro checks  -SBP goal < 160 -provigil -minimize sedation as able  -tx infection as outlined  -clonidine decreased 6/3  To 0.1mg  q8hr   Sepsis due to staph aureus PNA  -possible staph epi bacteremia but more likely this is contaminant  Plan: -ancef through 6/6  -trend fever curve and wbc   AKI Hypernatremia, mild - improving (iatrogenic, was previously on hypertonic) -was off FWF for trach 6/3  Plan: -FWF 200 ml -trend UOP renal indices   HFrEF -(EF 20-25% 12/2020) Plan: -amio, isosorbide -icu monitoring   HTN Plan: -SBP goal <160 -Amlodipine, clonidine, hydralazine, metop, PRN labetalol   Large uterine fibroids Acute on chronic anemia, multifactorial - stable  -iron deficiency anemia, ABLA  Plan: -IV iron -transfuse for hgb < 7 -Megace 80mg  TID per OB  -Poor surgical candidate  Hx HLD Plan: -statin    Inadequate PO intake -EN per RDN via cortrak -suspect probably will need a PEG if aggressive care continued.   Goals of Care -Conversation w PCCM NP Shirlee Limerick and brother 6/2: ---brother serves as Media planner - he is struggling greatly with feelings of guilt and grief-- would like to move forward with trach to facilitate possible further recovery. he is appropriately concerned about qol. We talked about ongoing evaluation of acceptable QOL for the pt, and brother feels that if she does not make meaningful progress toward an acceptable QOL then he would revisit possible comfort measures. He does not want pt to suffer. He has an appropriate understanding of her comorbidities and limitations with fibroids  -Palliative care is also  following, appreciate their help   Best practice (right click and "Reselect all SmartList Selections" daily)  Diet:  Tube Feed , Pain/Anxiety/Delirium protocol (if indicated): Yes (RASS goal 0) PRN fent available if needed VAP protocol (if indicated): Yes DVT prophylaxis: SCD GI prophylaxis: PPI Glucose control:  SSI No Central venous access:  N/A Arterial line:  N/A Foley:  N/A Mobility:  bed rest  PT consulted: Yes Last date of multidisciplinary goals of care discussion [per primary.] Code Status:  full code Disposition: ICU  Labs   CBC: Recent Labs  Lab 05/21/21 2239 05/22/21 0607 05/23/21 0435 05/24/21 0445 05/25/21 0235 05/26/21 0244  WBC 19.1*  --  16.8* 15.4* 18.1* 15.5*  HGB 6.7* 8.5* 7.7* 7.3* 7.6* 7.5*  HCT 23.3* 28.5* 26.2* 25.5* 26.4* 26.2*  MCV 71.9*  --  74.0* 74.8* 74.8* 76.2*  PLT 263  --  257 265 293 174    Basic Metabolic Panel: Recent Labs  Lab 05/21/21 2239 05/22/21 0607 05/23/21 0435 05/23/21 0953 05/23/21 2159 05/24/21 0445 05/24/21 1016 05/25/21 0235 05/26/21 0244  NA 157*   < > 152*   < > 152* 150* 150* 150* 147*  K 4.3  --  4.1  --   --  4.2  --  4.2 4.1  CL 130*  --  118*  --   --  116*  --  121* 114*  CO2 24  --  24  --   --  24  --  24 23  GLUCOSE 134*  --  126*  --   --  124*  --  104* 118*  BUN 28*  --  24*  --   --  24*  --  25* 25*  CREATININE 1.75*  --  1.39*  --   --  1.43*  --  1.45* 1.50*  CALCIUM 9.0  --  9.3  --   --  9.0  --  9.2 9.3   < > = values in this interval not displayed.   GFR: Estimated Creatinine Clearance: 46.4 mL/min (A) (by C-G formula based on SCr of 1.5 mg/dL (H)). Recent Labs  Lab 05/23/21 0435 05/24/21 0445 05/25/21 0235 05/26/21 0244  WBC 16.8* 15.4* 18.1* 15.5*    Liver Function Tests: No results for input(s): AST, ALT, ALKPHOS, BILITOT, PROT, ALBUMIN in the last 168 hours.  No results for input(s): AMMONIA in the last 168 hours.  ABG    Component Value Date/Time   PHART 7.333 (L)  05/17/2021 1817   PCO2ART 33.7 05/17/2021 1817   PO2ART 156 (H) 05/17/2021 1817   HCO3 17.6 (L) 05/17/2021 1817   TCO2 19 (L) 05/17/2021 1817   ACIDBASEDEF 7.0 (H) 05/17/2021 1817   O2SAT 99.0 05/17/2021 1817     HbA1C: Hgb A1c MFr Bld  Date/Time Value Ref Range Status  05/16/2021 04:57 AM 5.4 4.8 - 5.6 % Final    Comment:    (NOTE)         Prediabetes: 5.7 - 6.4         Diabetes: >6.4         Glycemic control for adults with diabetes: <7.0     CBG: Recent Labs  Lab 05/25/21 2055 05/26/21 0010 05/26/21 0304 05/26/21 0720 05/26/21 1101  GLUCAP 128* 113* 120* 116* 129*    CRITICAL CARE Performed by: Cristal Generous   Total critical care time: 38 minutes  Critical care time was exclusive of separately billable procedures and treating other patients.  Critical care was necessary to treat or prevent imminent or life-threatening deterioration.  Critical care was time spent personally by me on the following activities: development of treatment plan with patient and/or surrogate as well as nursing, discussions with consultants, evaluation of patient's response to treatment, examination of patient, obtaining history from patient or surrogate, ordering and performing treatments and interventions, ordering and review of laboratory studies, ordering and review of radiographic studies, pulse oximetry and re-evaluation of patient's condition.  [name] MSN, AGACNP-BC [place] for pager details  05/26/2021, 11:15 AM

## 2021-05-26 NOTE — Progress Notes (Signed)
STROKE TEAM PROGRESS NOTE   INTERVAL HISTORY No family at the bedside. Pt just post trach by Dr. Lynetta Mare yesterday. Neuro stable, Following commands on the right . DROWSY  Hb stable.    Vitals:   05/26/21 0600 05/26/21 0700 05/26/21 0736 05/26/21 0800  BP: 118/62 124/69  122/66  Pulse: 79 81  80  Resp: (!) 3 18  (!) 0  Temp:    97.9 F (36.6 C)  TempSrc:    Axillary  SpO2: 100% 100% 99% 100%  Weight:      Height:       CBC:  Recent Labs  Lab 05/25/21 0235 05/26/21 0244  WBC 18.1* 15.5*  HGB 7.6* 7.5*  HCT 26.4* 26.2*  MCV 74.8* 76.2*  PLT 293 865   Basic Metabolic Panel:  Recent Labs  Lab 05/25/21 0235 05/26/21 0244  NA 150* 147*  K 4.2 4.1  CL 121* 114*  CO2 24 23  GLUCOSE 104* 118*  BUN 25* 25*  CREATININE 1.45* 1.50*  CALCIUM 9.2 9.3   Lipid Panel:  Recent Labs  Lab 05/24/21 0447  TRIG 81   HgbA1c:  No results for input(s): HGBA1C in the last 168 hours. Urine Drug Screen:  No results for input(s): LABOPIA, COCAINSCRNUR, LABBENZ, AMPHETMU, THCU, LABBARB in the last 168 hours.  Alcohol Level  No results for input(s): ETH in the last 168 hours.  IMAGING  CT HEAD WO CONTRAST Result Date: 05/16/2021  IMPRESSION:  1. Continued interval evolution of moderately large right MCA distribution infarct. Associated regional mass effect has mildly increased, with new 2 mm of right-to-left midline shift. No hydrocephalus or trapping.  2. Decreased prominence of subarachnoid hemorrhage at the posterior right Sylvian fissure and overlying right frontoparietal region. Trace intraventricular hemorrhage related to redistribution.  3. Question new area of parenchymal hypodensity involving the lateral left cerebellum. While this finding is favored to be artifactual, a possible new acute ischemic infarct is difficult to exclude, and could be considered in the correct clinical setting. Attention at follow-up recommended.  4. No other new acute intracranial abnormality.   CT  HEAD WO CONTRAST Result Date: 05/15/2021  IMPRESSION: Small-to-moderate volume hyperdensity within the right sylvian fissure and along portions of the right frontoparietal and temporal lobes compatible with extravasated contrast/subarachnoid hemorrhage. There is hyperdensity within the right basal ganglia, right insula, lateral right frontal lobe/frontal operculum and within portions of the right parietal lobe likely reflecting contrast staining at sites of acute infarction. Mild associated mass effect with subtle partial effacement of the right lateral ventricle. No midline shift or hydrocephalus. Embolization material now overlies the lateral right parietal lobe. Stable background parenchymal atrophy and chronic small vessel ischemic disease. Redemonstrated chronic infarct within the superior left cerebellum.   MR BRAIN WO CONTRAST Result Date: 05/15/2021 IMPRESSION:  1. Moderately large acute right MCA infarct. Associated petechial hemorrhage, subarachnoid hemorrhage, and minimal intraventricular hemorrhage.  2. Severe chronic small vessel ischemic disease with multiple chronic infarcts.   MR CERVICAL SPINE WO CONTRAST Result Date: 05/15/2021  IMPRESSION: The cord itself appears normal. No disc disease. No spinal stenosis. 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 No significant encroachment upon the neural spaces. Some prevertebral soft tissue edema. In this clinical scenario, this may be subsequent to previous endotracheal intubation. One could not rule out pharyngitis or cellulitis but that seems less likely. I do not see any traumatic injury to the cervical spine itself. Electronically Signed   By: Jan Fireman.D.  On: 05/15/2021 16:05   IR CT Head Ltd Result Date: 05/15/2021 CT of the head was obtained and post processed in a separate workstation with concurrent attending physician supervision. Selected images were sent to PACS. Small right sylvian contrast extravasation  confirmed. Additionally, hyperdensity of the right basal ganglia and lateral right frontal cortex are noted, related to ongoing ischemia. Right internal carotid artery angiograms with frontal and lateral views of the head showed occlusion of the embolized branch with no evidence of contrast extravasation. Patency of the remaining MCA branches noted.  IR PERCUTANEOUS ART THROMBECTOMY/INFUSION INTRACRANIAL INC DIAG ANGIO Result Date: 05/15/2021 IMPRESSION: Mechanical thrombectomy performed for treatment of right M1/MCA occlusion with clot fragmentation and embolization to same distal territory requiring multiple additional passes. Procedure complicated by contrast extravasation at a distal right M3/MCA branch requiring vessel embolization. Near complete recanalization of the right MCA vascular tree was achieved (TICI 2b). PLAN: - Bed rest post femoral access x6h - Follow-up CT in 4 hours to evaluate for SAH/contrast extravasation stability - Transfer to ICU- SBP: 120-140 mmHg   Echo Result read: 05/16/2021 1. Left ventricular ejection fraction, by estimation, is 55 to 60%. The  left ventricle has normal function. The left ventricle has no regional  wall motion abnormalities. There is moderate concentric left ventricular  hypertrophy. Left ventricular  diastolic parameters are consistent with Grade II diastolic dysfunction  (pseudonormalization). Elevated left atrial pressure.  2. Right ventricular systolic function is normal. The right ventricular  size is normal. Mildly increased right ventricular wall thickness. There  is moderately elevated pulmonary artery systolic pressure. The estimated  right ventricular systolic pressure  is 78.9 mmHg.  3. Left atrial size was severely dilated.  4. Right atrial size was severely dilated.  5. There is no evidence of cardiac tamponade.  6. The mitral valve is normal in structure. Trivial mitral valve  regurgitation. No evidence of mitral stenosis.   7. The aortic valve is normal in structure. There is mild calcification  of the aortic valve. There is mild thickening of the aortic valve. Aortic  valve regurgitation is not visualized. Mild aortic valve sclerosis is  present, with no evidence of aortic  valve stenosis.  8. The inferior vena cava is dilated in size with <50% respiratory  variability, suggesting right atrial pressure of 15 mmHg.   PHYSICAL EXAM  Temp:  [97.8 F (36.6 C)-99.8 F (37.7 C)] 97.9 F (36.6 C) (06/04 0800) Pulse Rate:  [77-109] 80 (06/04 0800) Resp:  [0-27] 0 (06/04 0800) BP: (114-196)/(57-93) 122/66 (06/04 0800) SpO2:  [99 %-100 %] 100 % (06/04 0800) FiO2 (%):  [40 %] 40 % (06/04 0800) Weight:  [72.9 kg] 72.9 kg (06/04 0500)  General - Well nourished, well developed, s/p trach.  Ophthalmologic - fundi not visualized due to noncooperation.  Cardiovascular - Regular rhythm and rate  Neuro - intubated s/p trach, eyes closed, not open with voice stimulation,   able to follow commands on the right hand and foot. With forced eye opening, eyes in right forced gaze preference, not blinking to visual threat on the left, not tracking, PERRL. B/l eyelids mildly swollen. Corneal reflex present, gag and cough present. Facial symmetry not able to test due to ET tube.  Tongue protrusion not cooperative. On pain stimulation, left UE and LE flaccid. Right UE proximal 1/5 and distal finger movement 2+/5 and RLE rigorously movement in bed with stimulation. No babinski. Sensation, coordination not cooperative and gait not tested.   ASSESSMENT/PLAN Ms. Carmell Elgin  is a 45 y.o. female with history of uncontrolled HTN, tobacco use disorder, large uterine fibroids, heart failure, paroxysmal SVT, paranoid psychosis who presents with altered mental status after being found down.  CT head initially read as small acute appearing perforator infarct in the R BG and notable for severe small vessel ischemia. However concerning  for potential R MCA stroke and thus CTA head and neck and CTP was obtained with a R MCA M1 occlusion and a core of 50cc and 32ml of penumbra.   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   CTA neck: LVO right M1 origin. Left vertebral occlusion at its origin. High-grade narrowings of the right cavernous ICA.  CT perfusion suggestive 50 cc core infarct and 50 cc of adjacent penumbra.  MRI: large acute right MCA infarct. Associated petechial hemorrhage, subarachnoid hemorrhage, and minimal intraventricular hemorrhage.  CT 5/28 stable right MCA infarct with MLS  CT 5/30 stable right MCA infarct and MLS 37mm  2D Echo 55 to 60%  Further cardioembolic work up once pt neuro improves  Hypercoagulable panel ordered  UDS: negative  LDL 76  HgbA1c 5.4  UDS neg  Sickle cell screen - neg, ANA neg  VTE prophylaxis - SCDs  No antithrombotic prior to admission, now on ASA 81.  Therapy recommendations:  CIR  Disposition:  Pending  Cytotoxic cerebral edema  On 3% saline @ 30 -> FW 100 Q4. Stopped. Now on free water through [place]  Na 157->156->157->155->157->153->150->150 >147  CT head 5/30 stable right MCA infarcts and MLS 80mm  OK to gradually normalize Na level  Respiratory failure  Intubated 5/26 for increased work of breathing with altered mental status   Management per CCM, appreciated  Switched to precedex -> now off   Not candidate for extubation  S/p trach 6/3 with CCM  Acute blood loss anemia Large uterine fibroids  hgb 7.8->5.9->PRBC->7.6->6.5->PRBC->7.2->6.7->PRBC->8.5->7.7->7.3->7.6 >7.5  PRBC tranfusion x 3  extensive virginal bleeding from extremely large abdominopelvic mass measuring up to 22.9 cm noted on CT 5/26  Has been followed with Dr. Jonna Clark - poor  surgical candidate due to uncontrolled HTN and SVT  Abdominal distention on exam  Increase Megace to megace 80mg  tid  OBGYN Dr. Rosana Hoes consulted, appreciate help - not surgical candidate  Dysphagia  Likely due to stroke  SLP following  cortrak placement  On TF @ 50 and FW  Paroxysmal SVT history Hx of CHF  Followed with cardiology   EF 40-45% in 04/2020 and 20-25% in 12/2020  This admission EF 55-60%  on amiodarone 200mg  daily and isosorbide 20 tid  Currently heart rate controlled  Consider TEE/Loop recorder prior to discahrge if able to tolerate  Hypertension  Home meds:  Hydralazine 100mg  TID, Imdur 60mg  daily, Toprol XL 200mg  daily  Off Cleviprex now  Amlodipine 10mg  daily -> 5mg  bid  clonidine 0.2 tid -> 0.1 Q8h  Hydralazine 100mg  tid    imdur 20mg  tid -> Q8h  Metoprolol 100mg  bid  SBP goal to <160 mmHg  Labetalol prn . Long-term BP goal normotensive  Hyperlipidemia  Home meds:  none  LDL 76, goal < 70  Atorvastatin 40mg  daily  Continue statin at discharge  Tobacco abuse  Current smoker  Smoking cessation counseling will be provided  Fever and Leukocytosis  WBC 12.7->14.0->23.1->20.9->20.6->19.1->16.8->15.4->18.1 >15.5  Tmax 101.9->100.1  Sputum culture - staph Aures   Blood culture G+ cocci in clusters  CXR LLL atx vs. pneumonia  CCM on board  On ancef   Other Stroke Risk Factors  Coronary artery disease  Other Active Problems  AKI: creatinine 1.27->1.48->1.54->1.51->1.52->1.75->1.39->1.43->1.45 >1.5  Lethargic - added provigil by CCM   hospital day #11  This patient is critically ill due to respiratory failure s/p trach, right MCA infarct with hemorrhagic conversion, bacteremia, severe anemia, leukocytosis and AKI and at significant risk of neurological worsening, death form recurrent stroke, sepsis, shock with severe anemia, cerebral edema. This patient's care requires constant monitoring of vital signs,  hemodynamics, respiratory and cardiac monitoring, review of multiple databases, neurological assessment, discussion with family, other specialists and medical decision making of high complexity. I spent 35 minutes of neurocritical care time in the care of this patient. I discussed with Dr. Lynetta Mare.   Josiah Lobo, MD Stroke Neurology 05/26/2021 9:01 AM     To contact Stroke Continuity provider, please refer to [email or web address] After hours, contact General Neurology

## 2021-05-27 DIAGNOSIS — I63411 Cerebral infarction due to embolism of right middle cerebral artery: Secondary | ICD-10-CM

## 2021-05-27 DIAGNOSIS — I5022 Chronic systolic (congestive) heart failure: Secondary | ICD-10-CM

## 2021-05-27 DIAGNOSIS — I639 Cerebral infarction, unspecified: Secondary | ICD-10-CM | POA: Diagnosis not present

## 2021-05-27 DIAGNOSIS — J9601 Acute respiratory failure with hypoxia: Secondary | ICD-10-CM | POA: Diagnosis not present

## 2021-05-27 LAB — BASIC METABOLIC PANEL
Anion gap: 7 (ref 5–15)
BUN: 27 mg/dL — ABNORMAL HIGH (ref 6–20)
CO2: 22 mmol/L (ref 22–32)
Calcium: 8.6 mg/dL — ABNORMAL LOW (ref 8.9–10.3)
Chloride: 113 mmol/L — ABNORMAL HIGH (ref 98–111)
Creatinine, Ser: 1.43 mg/dL — ABNORMAL HIGH (ref 0.44–1.00)
GFR, Estimated: 46 mL/min — ABNORMAL LOW (ref >60)
Glucose, Bld: 111 mg/dL — ABNORMAL HIGH (ref 70–99)
Potassium: 4.2 mmol/L (ref 3.5–5.1)
Sodium: 142 mmol/L (ref 135–145)

## 2021-05-27 LAB — GLUCOSE, CAPILLARY
Glucose-Capillary: 98 mg/dL (ref 70–99)
Glucose-Capillary: 120 mg/dL — ABNORMAL HIGH (ref 70–99)
Glucose-Capillary: 120 mg/dL — ABNORMAL HIGH (ref 70–99)
Glucose-Capillary: 114 mg/dL — ABNORMAL HIGH (ref 70–99)
Glucose-Capillary: 110 mg/dL — ABNORMAL HIGH (ref 70–99)
Glucose-Capillary: 107 mg/dL — ABNORMAL HIGH (ref 70–99)

## 2021-05-27 LAB — CBC
HCT: 31.7 % — ABNORMAL LOW (ref 36.0–46.0)
Hemoglobin: 9.1 g/dL — ABNORMAL LOW (ref 12.0–15.0)
MCH: 21.8 pg — ABNORMAL LOW (ref 26.0–34.0)
MCHC: 28.7 g/dL — ABNORMAL LOW (ref 30.0–36.0)
MCV: 76 fL — ABNORMAL LOW (ref 80.0–100.0)
Platelets: 392 10*3/uL (ref 150–400)
RBC: 4.17 MIL/uL (ref 3.87–5.11)
RDW: 26.6 % — ABNORMAL HIGH (ref 11.5–15.5)
WBC: 17.7 10*3/uL — ABNORMAL HIGH (ref 4.0–10.5)
nRBC: 0.1 % (ref 0.0–0.2)

## 2021-05-27 NOTE — Progress Notes (Signed)
STROKE TEAM PROGRESS NOTE   INTERVAL HISTORY No family at the bedside. Pt just post trach.  Neuro stable, Following commands on the right . DROWSY  Hb stable.    Vitals:   05/27/21 0700 05/27/21 0800 05/27/21 0806 05/27/21 0900  BP: (!) 99/49 140/72  128/67  Pulse: 86 87  87  Resp: 20 (!) 21  (!) 22  Temp:  98.3 F (36.8 C)    TempSrc:  Axillary    SpO2: 100% 100% 97% 100%  Weight:      Height:       CBC:  Recent Labs  Lab 05/26/21 0244 05/27/21 0314  WBC 15.5* 17.7*  HGB 7.5* 9.1*  HCT 26.2* 31.7*  MCV 76.2* 76.0*  PLT 314 500   Basic Metabolic Panel:  Recent Labs  Lab 05/26/21 0244 05/27/21 0314  NA 147* 142  K 4.1 4.2  CL 114* 113*  CO2 23 22  GLUCOSE 118* 111*  BUN 25* 27*  CREATININE 1.50* 1.43*  CALCIUM 9.3 8.6*   Lipid Panel:  Recent Labs  Lab 05/24/21 0447  TRIG 81   HgbA1c:  No results for input(s): HGBA1C in the last 168 hours. Urine Drug Screen:  No results for input(s): LABOPIA, COCAINSCRNUR, LABBENZ, AMPHETMU, THCU, LABBARB in the last 168 hours.  Alcohol Level  No results for input(s): ETH in the last 168 hours.  IMAGING  CT HEAD WO CONTRAST Result Date: 05/16/2021  IMPRESSION:  1. Continued interval evolution of moderately large right MCA distribution infarct. Associated regional mass effect has mildly increased, with new 2 mm of right-to-left midline shift. No hydrocephalus or trapping.  2. Decreased prominence of subarachnoid hemorrhage at the posterior right Sylvian fissure and overlying right frontoparietal region. Trace intraventricular hemorrhage related to redistribution.  3. Question new area of parenchymal hypodensity involving the lateral left cerebellum. While this finding is favored to be artifactual, a possible new acute ischemic infarct is difficult to exclude, and could be considered in the correct clinical setting. Attention at follow-up recommended.  4. No other new acute intracranial abnormality.   CT HEAD WO  CONTRAST Result Date: 05/15/2021  IMPRESSION: Small-to-moderate volume hyperdensity within the right sylvian fissure and along portions of the right frontoparietal and temporal lobes compatible with extravasated contrast/subarachnoid hemorrhage. There is hyperdensity within the right basal ganglia, right insula, lateral right frontal lobe/frontal operculum and within portions of the right parietal lobe likely reflecting contrast staining at sites of acute infarction. Mild associated mass effect with subtle partial effacement of the right lateral ventricle. No midline shift or hydrocephalus. Embolization material now overlies the lateral right parietal lobe. Stable background parenchymal atrophy and chronic small vessel ischemic disease. Redemonstrated chronic infarct within the superior left cerebellum.   MR BRAIN WO CONTRAST Result Date: 05/15/2021 IMPRESSION:  1. Moderately large acute right MCA infarct. Associated petechial hemorrhage, subarachnoid hemorrhage, and minimal intraventricular hemorrhage.  2. Severe chronic small vessel ischemic disease with multiple chronic infarcts.   MR CERVICAL SPINE WO CONTRAST Result Date: 05/15/2021  IMPRESSION: The cord itself appears normal. No disc disease. No spinal stenosis. 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 No significant encroachment upon the neural spaces. Some prevertebral soft tissue edema. In this clinical scenario, this may be subsequent to previous endotracheal intubation. One could not rule out pharyngitis or cellulitis but that seems less likely. I do not see any traumatic injury to the cervical spine itself. Electronically Signed   By: Jan Fireman.D.  On: 05/15/2021 16:05   IR CT Head Ltd Result Date: 05/15/2021 CT of the head was obtained and post processed in a separate workstation with concurrent attending physician supervision. Selected images were sent to PACS. Small right sylvian contrast extravasation confirmed.  Additionally, hyperdensity of the right basal ganglia and lateral right frontal cortex are noted, related to ongoing ischemia. Right internal carotid artery angiograms with frontal and lateral views of the head showed occlusion of the embolized branch with no evidence of contrast extravasation. Patency of the remaining MCA branches noted.  IR PERCUTANEOUS ART THROMBECTOMY/INFUSION INTRACRANIAL INC DIAG ANGIO Result Date: 05/15/2021 IMPRESSION: Mechanical thrombectomy performed for treatment of right M1/MCA occlusion with clot fragmentation and embolization to same distal territory requiring multiple additional passes. Procedure complicated by contrast extravasation at a distal right M3/MCA branch requiring vessel embolization. Near complete recanalization of the right MCA vascular tree was achieved (TICI 2b). PLAN: - Bed rest post femoral access x6h - Follow-up CT in 4 hours to evaluate for SAH/contrast extravasation stability - Transfer to ICU- SBP: 120-140 mmHg   Echo Result read: 05/16/2021 1. Left ventricular ejection fraction, by estimation, is 55 to 60%. The  left ventricle has normal function. The left ventricle has no regional  wall motion abnormalities. There is moderate concentric left ventricular  hypertrophy. Left ventricular  diastolic parameters are consistent with Grade II diastolic dysfunction  (pseudonormalization). Elevated left atrial pressure.  2. Right ventricular systolic function is normal. The right ventricular  size is normal. Mildly increased right ventricular wall thickness. There  is moderately elevated pulmonary artery systolic pressure. The estimated  right ventricular systolic pressure  is 26.8 mmHg.  3. Left atrial size was severely dilated.  4. Right atrial size was severely dilated.  5. There is no evidence of cardiac tamponade.  6. The mitral valve is normal in structure. Trivial mitral valve  regurgitation. No evidence of mitral stenosis.  7. The  aortic valve is normal in structure. There is mild calcification  of the aortic valve. There is mild thickening of the aortic valve. Aortic  valve regurgitation is not visualized. Mild aortic valve sclerosis is  present, with no evidence of aortic  valve stenosis.  8. The inferior vena cava is dilated in size with <50% respiratory  variability, suggesting right atrial pressure of 15 mmHg.   PHYSICAL EXAM  Temp:  [98.1 F (36.7 C)-99.8 F (37.7 C)] 98.3 F (36.8 C) (06/05 0800) Pulse Rate:  [79-93] 87 (06/05 0900) Resp:  [14-23] 22 (06/05 0900) BP: (99-141)/(49-75) 128/67 (06/05 0900) SpO2:  [97 %-100 %] 100 % (06/05 0900) FiO2 (%):  [30 %-40 %] 40 % (06/05 0806) Weight:  [76.5 kg] 76.5 kg (06/05 0400)  General - Well nourished, well developed, s/p trach.  Ophthalmologic - fundi not visualized due to noncooperation.  Cardiovascular - Regular rhythm and rate  Neuro - intubated s/p trach, eyes closed, not open with voice stimulation,   able to follow commands on the right hand and foot. With forced eye opening, eyes in right forced gaze preference, not blinking to visual threat on the left, not tracking, PERRL. B/l eyelids mildly swollen. Corneal reflex present, gag and cough present. Facial symmetry not able to test due to ET tube.  Tongue protrusion not cooperative. On pain stimulation, left UE and LE flaccid. Right UE proximal 1/5 and distal finger movement 2+/5 and RLE rigorously movement in bed with stimulation. No babinski. Sensation, coordination not cooperative and gait not tested.   ASSESSMENT/PLAN Ms. Lalena  Mcglaun is a 45 y.o. female with history of uncontrolled HTN, tobacco use disorder, large uterine fibroids, heart failure, paroxysmal SVT, paranoid psychosis who presents with altered mental status after being found down.  CT head initially read as small acute appearing perforator infarct in the R BG and notable for severe small vessel ischemia. However concerning for  potential R MCA stroke and thus CTA head and neck and CTP was obtained with a R MCA M1 occlusion and a core of 50cc and 33ml of penumbra.   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   CTA neck: LVO right M1 origin. Left vertebral occlusion at its origin. High-grade narrowings of the right cavernous ICA.  CT perfusion suggestive 50 cc core infarct and 50 cc of adjacent penumbra.  MRI: large acute right MCA infarct. Associated petechial hemorrhage, subarachnoid hemorrhage, and minimal intraventricular hemorrhage.  CT 5/28 stable right MCA infarct with MLS  CT 5/30 stable right MCA infarct and MLS 2mm  2D Echo 55 to 60%  Further cardioembolic work up once pt neuro improves  Hypercoagulable panel ordered  UDS: negative  LDL 76  HgbA1c 5.4  UDS neg  Sickle cell screen - neg, ANA neg  VTE prophylaxis - SCDs  No antithrombotic prior to admission, now on ASA 81.  Therapy recommendations:  CIR  Disposition:  Pending  Cytotoxic cerebral edema  On 3% saline @ 30 -> FW 100 Q4. Stopped. Now on free water through [place]  Na 157->156->157->155->157->153->150->150 >147  CT head 5/30 stable right MCA infarcts and MLS 30mm  OK to gradually normalize Na level  Respiratory failure  Intubated 5/26 for increased work of breathing with altered mental status   Management per CCM, appreciated  Switched to precedex -> now off   Not candidate for extubation  S/p trach 6/3 with CCM, Trach collar today  Acute blood loss anemia Large uterine fibroids  hgb 7.8->5.9->PRBC->7.6->6.5->PRBC->7.2->6.7->PRBC->8.5->7.7->7.3->7.6 >7.5>9/1  PRBC tranfusion x 3  extensive virginal bleeding from extremely large abdominopelvic mass measuring up to 22.9 cm noted on CT 5/26  Has been followed with Dr.  Jonna Clark - poor surgical candidate due to uncontrolled HTN and SVT  Abdominal distention on exam  Increase Megace to megace 80mg  tid  OBGYN Dr. Rosana Hoes consulted, appreciate help - not surgical candidate  Dysphagia  Likely due to stroke  SLP following  cortrak placement  On TF @ 50 and FW  Paroxysmal SVT history Hx of CHF  Followed with cardiology   EF 40-45% in 04/2020 and 20-25% in 12/2020  This admission EF 55-60%  on amiodarone 200mg  daily and isosorbide 20 tid  Currently heart rate controlled  Consider TEE/Loop recorder prior to discahrge if able to tolerate  Hypertension  Home meds:  Hydralazine 100mg  TID, Imdur 60mg  daily, Toprol XL 200mg  daily  Off Cleviprex now  Amlodipine 10mg  daily -> 5mg  bid  clonidine 0.2 tid -> 0.1 Q8h  Hydralazine 100mg  tid    imdur 20mg  tid -> Q8h  Metoprolol 100mg  bid  SBP goal to <160 mmHg  Labetalol prn . Long-term BP goal normotensive  Hyperlipidemia  Home meds:  none  LDL 76, goal < 70  Atorvastatin 40mg  daily  Continue statin at discharge  Tobacco abuse  Current smoker  Smoking cessation counseling will be provided  Fever and Leukocytosis  WBC 12.7->14.0->23.1->20.9->20.6->19.1->16.8->15.4->18.1 >15.5>17.7   Afebrile for the past 24 h  Sputum culture - staph Aures   Blood culture G+ cocci in clusters  CXR LLL atx vs. pneumonia  CCM on board  On ancef   Other Stroke Risk Factors  Coronary artery disease  Other Active Problems  AKI: creatinine 1.27->1.48->1.54->1.51->1.52->1.75->1.39->1.43->1.45 >1.5>1.43  Lethargic - added provigil by CCM   hospital day #12  This patient is critically ill due to respiratory failure s/p trach, right MCA infarct with hemorrhagic conversion, bacteremia, severe anemia, leukocytosis and AKI and at significant risk of neurological worsening, death form recurrent stroke, sepsis, shock with severe anemia, cerebral edema. This patient's care requires constant  monitoring of vital signs, hemodynamics, respiratory and cardiac monitoring, review of multiple databases, neurological assessment, discussion with  other specialists and medical decision making of high complexity.    Josiah Lobo, MD Stroke Neurology 05/27/2021 9:19 AM     To contact Stroke Continuity provider, please refer to [email or web address] After hours, contact General Neurology

## 2021-05-27 NOTE — Progress Notes (Signed)
NAME:  Holly Bradley, MRN:  [id number], DOB:  1976/08/05, LOS: 12  ADMISSION DATE:  05/15/2021, CONSULTATION DATE:  05/27/21 REFERRING MD:  Leonie Man, CHIEF COMPLAINT:  Respiratory failure   History of Present Illness:  [name] is a [name] with PMH of HTN, tobacco use, CHF, DMT2 (controlled with diet and exercise), pSVT, uterine fibroids, paranoid psychosis who presented with AMS on 5/24.   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  On follow up imaging, she had stable appearing SAH and continued R basal ganglia infarct with 76mm R to L shift.   She was admitted to the Neuro ICU and treating with hypertonic saline, statin and BP control.  On 5/26, PCCM consulted when pt became acutely less responsive with respiratory distress along with abdominal distension worse than baseline, per patient.  She was intubated and repeat head CT, abd/pelvis CT ordered.  Repeat head CT on 5/26 showed increased cytotoxic edema and mildly increased leftward midline shift, now 5 mm, decreased SAH and IVH.  Abdominal CT showing 22.9 cm mass, possibly consistent with uterine fibroids, no lymphadenopathy.      Pertinent  Medical History  -CHF -tobacco use -DMT2 -HTN -pSVT -uterine fibroids -paranoid psychosis   Significant Hospital Events: Including procedures, antibiotic start and stop dates in addition to other pertinent events   . 5/24 Admit to neurology after ED visit with AMS, found to have R MCA stroke, underwent mechanical thrombectomy, M3 coil embolization, subsequent SAH . 5/26 decreased mental status and respiratory distress, intubated  . 6/1 still not candidate for extubation with poor mentation and cough mechanics. Family unsure if pt would want trach. Staph epi bacteremia, continues on ancef  . 6/2 moving RUE RLE  following some commands. decision to move forward with tracheostomy.   . 6/3 perc trach. Slight WBC increase but no fever, continues on ancef  . 6/4 Tolerating PSV/CPAP  . 6/5 WBC up to 17, last day of ancef for staph pna. Trying ATC   Interim History / Subjective:   Having gas distension, some looser stools -- simethicone and imodium added late in shift   FiO2 further weaned to 30% overnight   WBC up from 15 to 17, still not febrile  HDS  Trying ATC this morning   Objective   Blood pressure 126/71, pulse 87, temperature 99.8 F (37.7 C), temperature source Oral, resp. rate 20, height 5\' 5"  (1.651 m), weight 76.5 kg, SpO2 99 %.    Vent Mode: PRVC FiO2 (%):  [30 %-40 %] 30 % Set Rate:  [20 bmp] 20 bmp Vt Set:  [460 mL] 460 mL PEEP:  [5 cmH20] 5 cmH20 Pressure Support:  [8 cmH20] 8 cmH20 Plateau Pressure:  [12 cmH20] 12 cmH20   Intake/Output Summary (Last 24 hours) at 05/27/2021 0707 Last data filed at 05/27/2021 0600 Gross per 24 hour  Intake 2700.07 ml  Output 700 ml  Net 2000.07 ml   Filed Weights   05/25/21 0400 05/26/21 0500 05/27/21 0400  Weight: 76 kg 72.9 kg 76.5 kg   General:  Ill appearing middle aged F reclined in bed ATC NAD  HEENT: Trach secure, anicteric sclera. Pink mm. Improved tongue swelling and decreasing periorbital edema   Neuro: Drowsy. Awakens to voice, opens eyes, R sided gaze does not cross midline. Following R sided commands. Moves RUE RLE against  gravity  CV: rrr s1s2 no rgm cap refill brisk  PULM: CTAb slightly diminished basilar sounds. Even and unlabored on ATC  GU: Large uterine fibroids. Purewick  GI: Soft abdomen other than fibroids. + bowel sounds, noon-tender  Extremities: Non-pitting dependent left extremity edema  Skin : c/d/w no rash   Labs/imaging that I havepersonally reviewed  (right click and "Reselect all SmartList Selections" daily)   CT Head 5/30: Stable R MCA infarct. 5 mm R to L midline shift (no significant change).  KUB   6/5 non-obstructive gas  6/5: Na 142 Glu 11 Cr 1.43 WBC 17 hgb 9.1   Assessment & Plan:   Acute respiratory failure with hypoxia S/p tracheostomy Staph aureus PNA Plan: -routine post-trach care -Starting ATC 6/5 morning. Will continue as tolerated. If needed can go back on vent  -cefazolin through 6/6  Acute encephalopathy -likely multifactorial in setting of intracranial process below, delirium, infection/metabolic abnormalities  R MCA CVA with SAH and midline shift, cerebral edema  Plan: -cont neuro checks  -SBP goal < 160 -provigil -minimize sedation as able  -tx infection as outlined  -clonidine decreased 6/3  To 0.1mg  q8hr -- hemodynamics might allow further decrease in the coming days   Sepsis due to staph a. PNA -possible staph epi bacteremia but more like contaminant (drawn from same site) Leukocytosis - hanging between 15-17. 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) Plan: -amio, isosorbide -icu monitoring   HTN Plan: -SBP goal <160 -Amlodipine, clonidine, hydralazine, metop, PRN labetalol   Large uterine fibroids Acute on chronic anemia- multifactorial and stable  -iron deficiency anemia, ABLA  Plan: -IV iron -transfuse for hgb < 7 -Megace 80mg  TID per OB  -Poor surgical candidate  Hx HLD  Plan: -statin    Inadequate PO intake  -EN per RDN via cortrak -suspect probably will need a PEG if aggressive care continued -SLP  Flatulence/abdominal cramping Loose stool -does not seem very concerning for GI infection at present but will continue to monitor (more likely EN + FWF + GI upset from abx)  P -PRN  simethicone -if needed, PRN imodium  -can also consider a bulking agent  Goals of Care -Conversation w PCCM NP Shirlee Limerick and brother 6/2: ---brother serves as Media planner - he is struggling greatly with feelings of guilt and grief-- would like to move forward with trach to facilitate possible further recovery. he is appropriately concerned about qol. We talked about ongoing evaluation of acceptable QOL for the pt, and brother feels that if she does not make meaningful progress toward an acceptable QOL then he would revisit possible comfort measures. He does not want pt to suffer. He has an appropriate understanding of her comorbidities and limitations with fibroids  -Palliative care is also following, appreciate their help   Best practice (right click and "Reselect all SmartList Selections" daily)  Diet:  Tube Feed , Pain/Anxiety/Delirium protocol (if indicated): Yes (RASS goal 0) PRN fent available if needed VAP protocol (if indicated): Yes DVT prophylaxis: SCD GI prophylaxis: PPI Glucose control:  SSI No Central venous access:  N/A Arterial line:  N/A Foley:  N/A Mobility:  bed rest  PT consulted: Yes Last date of multidisciplinary goals of care discussion [per primary.] Code Status:  full code Disposition: ICU  Labs   CBC: Recent Labs  Lab 05/23/21 0435 05/24/21 0445 05/25/21 0235 05/26/21 0244 05/27/21 0314  WBC 16.8* 15.4* 18.1* 15.5* 17.7*  HGB 7.7* 7.3* 7.6* 7.5* 9.1*  HCT 26.2* 25.5* 26.4* 26.2* 31.7*  MCV 74.0* 74.8* 74.8* 76.2* 76.0*  PLT 257 265 293 314 759    Basic Metabolic Panel: Recent Labs  Lab 05/23/21 0435 05/23/21 0953 05/24/21 0445 05/24/21 1016 05/25/21 0235 05/26/21 0244 05/27/21 0314  NA 152*   < > 150* 150* 150* 147* 142  K 4.1  --  4.2  --  4.2 4.1 4.2  CL 118*  --  116*  --  121* 114* 113*  CO2 24  --  24  --  24 23 22   GLUCOSE 126*  --  124*  --  104* 118* 111*  BUN 24*  --  24*  --  25* 25* 27*  CREATININE 1.39*  --  1.43*  --  1.45*  1.50* 1.43*  CALCIUM 9.3  --  9.0  --  9.2 9.3 8.6*   < > = values in this interval not displayed.   GFR: Estimated Creatinine Clearance: 49.8 mL/min (A) (by C-G formula based on SCr of 1.43 mg/dL (H)). Recent Labs  Lab 05/24/21 0445 05/25/21 0235 05/26/21 0244 05/27/21 0314  WBC 15.4* 18.1* 15.5* 17.7*    Liver Function Tests: No results for input(s): AST, ALT, ALKPHOS, BILITOT, PROT, ALBUMIN in the last 168 hours.  No results for input(s): AMMONIA in the last 168 hours.  ABG    Component Value Date/Time   PHART 7.333 (L) 05/17/2021 1817   PCO2ART 33.7 05/17/2021 1817   PO2ART 156 (H) 05/17/2021 1817   HCO3 17.6 (L) 05/17/2021 1817   TCO2 19 (L) 05/17/2021 1817   ACIDBASEDEF 7.0 (H) 05/17/2021 1817   O2SAT 99.0 05/17/2021 1817     HbA1C: Hgb A1c MFr Bld  Date/Time Value Ref Range Status  05/16/2021 04:57 AM 5.4 4.8 - 5.6 % Final    Comment:    (NOTE)         Prediabetes: 5.7 - 6.4         Diabetes: >6.4         Glycemic control for adults with diabetes: <7.0     CBG: Recent Labs  Lab 05/26/21 1101 05/26/21 1518 05/26/21 1913 05/26/21 2301 05/27/21 0304  GLUCAP 129* 116* 108* 129* 98    CRITICAL CARE Performed by: Cristal Generous   Total critical care time: 40 minutes  Critical care time was exclusive of separately billable procedures and treating other patients. Critical care was necessary to treat or prevent imminent or life-threatening deterioration.  Critical care was time spent personally by me on the following activities: development of treatment plan with patient and/or surrogate as well as nursing, discussions with consultants, evaluation of patient's response to treatment, examination of patient, obtaining history from patient or surrogate, ordering and performing treatments and interventions, ordering and review of laboratory studies, ordering and review of radiographic studies, pulse oximetry and re-evaluation of patient's condition.  [name] MSN, AGACNP-BC [place] for pager 05/27/2021, 9:24 AM

## 2021-05-28 ENCOUNTER — Telehealth (HOSPITAL_COMMUNITY): Payer: Self-pay | Admitting: Licensed Clinical Social Worker

## 2021-05-28 DIAGNOSIS — G936 Cerebral edema: Secondary | ICD-10-CM

## 2021-05-28 LAB — GLUCOSE, CAPILLARY
Glucose-Capillary: 105 mg/dL — ABNORMAL HIGH (ref 70–99)
Glucose-Capillary: 122 mg/dL — ABNORMAL HIGH (ref 70–99)
Glucose-Capillary: 129 mg/dL — ABNORMAL HIGH (ref 70–99)
Glucose-Capillary: 115 mg/dL — ABNORMAL HIGH (ref 70–99)
Glucose-Capillary: 115 mg/dL — ABNORMAL HIGH (ref 70–99)
Glucose-Capillary: 116 mg/dL — ABNORMAL HIGH (ref 70–99)

## 2021-05-28 LAB — BASIC METABOLIC PANEL
Anion gap: 10 (ref 5–15)
BUN: 26 mg/dL — ABNORMAL HIGH (ref 6–20)
CO2: 21 mmol/L — ABNORMAL LOW (ref 22–32)
Calcium: 9.1 mg/dL (ref 8.9–10.3)
Chloride: 109 mmol/L (ref 98–111)
Creatinine, Ser: 1.3 mg/dL — ABNORMAL HIGH (ref 0.44–1.00)
GFR, Estimated: 51 mL/min — ABNORMAL LOW (ref >60)
Glucose, Bld: 101 mg/dL — ABNORMAL HIGH (ref 70–99)
Potassium: 5.3 mmol/L — ABNORMAL HIGH (ref 3.5–5.1)
Sodium: 140 mmol/L (ref 135–145)

## 2021-05-28 LAB — CBC
HCT: 35.2 % — ABNORMAL LOW (ref 36.0–46.0)
Hemoglobin: 10 g/dL — ABNORMAL LOW (ref 12.0–15.0)
MCH: 21.9 pg — ABNORMAL LOW (ref 26.0–34.0)
MCHC: 28.4 g/dL — ABNORMAL LOW (ref 30.0–36.0)
MCV: 77.2 fL — ABNORMAL LOW (ref 80.0–100.0)
Platelets: 427 10*3/uL — ABNORMAL HIGH (ref 150–400)
RBC: 4.56 MIL/uL (ref 3.87–5.11)
RDW: 27.7 % — ABNORMAL HIGH (ref 11.5–15.5)
WBC: 19.4 10*3/uL — ABNORMAL HIGH (ref 4.0–10.5)
nRBC: 0 % (ref 0.0–0.2)

## 2021-05-28 LAB — HOMOCYSTEINE: Homocysteine: 11.9 umol/L (ref 0.0–14.5)

## 2021-05-28 MED ORDER — CARVEDILOL 12.5 MG PO TABS
25.0000 mg | ORAL_TABLET | Freq: Two times a day (BID) | ORAL | Status: DC
  Administered 2021-05-28 – 2021-06-21 (×48): 25 mg
  Filled 2021-05-28 (×48): qty 2

## 2021-05-28 NOTE — Progress Notes (Signed)
NAME:  Holly Bradley, MRN:  [id number], DOB:  August 15, 1976, LOS: 48  ADMISSION DATE:  05/15/2021, CONSULTATION DATE:  05/28/21 REFERRING MD:  Leonie Man, CHIEF COMPLAINT:  Respiratory failure   History of Present Illness:  Ms. Schnackenberg is a [name] with PMH of HTN, tobacco use, HFpF, DMT2 (controlled with diet and exercise), pSVT, uterine fibroids, paranoid psychosis who presented with AMS on 5/24.   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  On follow up imaging, she had stable appearing SAH and continued R basal ganglia infarct with 51mm R to L shift.   She was admitted to the Neuro ICU and treating with hypertonic saline, statin and BP control.  On 5/26, PCCM consulted when pt became acutely less responsive with respiratory distress along with abdominal distension worse than baseline, per patient.  She was intubated and repeat head CT, abd/pelvis CT ordered.  Repeat head CT on 5/26 showed increased cytotoxic edema and mildly increased leftward midline shift, now 5 mm, decreased SAH and IVH.  Abdominal CT showing 22.9 cm mass, possibly consistent with uterine fibroids, no lymphadenopathy.      Significant Hospital Events: Including procedures, antibiotic start and stop dates in addition to other pertinent events   . 5/24 Admit to neurology after ED visit with AMS, found to have R MCA stroke, underwent mechanical thrombectomy, M3 coil embolization, subsequent SAH . 5/26 decreased mental status and respiratory distress, intubated  . 6/1 still not candidate for extubation with poor mentation and cough mechanics. Family unsure if pt would want trach. Staph epi bacteremia, continues on ancef  . 6/2 moving RUE RLE following some commands. decision to move forward with tracheostomy.   . 6/3 perc trach. Slight WBC increase  but no fever, continues on ancef  . 6/4 Tolerating PSV/CPAP  . 6/5 WBC up to 17, last day of ancef for staph pna. Trying ATC   Interim History / Subjective:  Patient's white count continues to rise, today is 19 she is having temperature spikes  Started with loose stools, requiring Flexi-Seal overnight  Objective   Blood pressure (!) 147/76, pulse 91, temperature 100.1 F (37.8 C), temperature source Axillary, resp. rate (!) 23, height 5\' 5"  (1.651 m), weight 74.6 kg, SpO2 98 %.    FiO2 (%):  [21 %-40 %] 21 %   Intake/Output Summary (Last 24 hours) at 05/28/2021 0748 Last data filed at 05/28/2021 0600 Gross per 24 hour  Intake 1481.78 ml  Output 400 ml  Net 1081.78 ml   Filed Weights   05/26/21 0500 05/27/21 0400 05/28/21 0500  Weight: 72.9 kg 76.5 kg 74.6 kg   General: Acutely ill appearing middle aged F, s/p trach  HEENT: Trach secure, anicteric sclera. Pink mm.  No JVD Neuro: Opens eyes with vocal stimuli, following simple commands, antigravity on right side, R sided gaze does not cross midline.  CV: rrr s1s2 no rgm cap refill brisk  PULM: Coarse breath sounds all over, no rhonchi, wheezes or crackles   GI: Soft abdomen  + bowel sounds, noon-tender  Extremities: Non-pitting dependent left extremity edema  Skin : c/d/w no rash   Labs/imaging that I havepersonally reviewed  (right click and "Reselect all SmartList Selections" daily)   CT Head 5/30: Stable R MCA infarct. 5 mm R to L midline shift (no significant change).  KUB  6/5  non-obstructive gas  6/5: Na 140 Cr 1.3 WBC 19 hgb 10   Assessment & Plan:   Acute respiratory failure with hypoxia S/p tracheostomy MSSA pneumonia Patient is tolerating trach collar, now it is more than 24 hours Routine post-trach care Last day of cefazolin to treat MSSA pneumonia  Acute encephalopathy Multifactorial, in setting of intracranial process below, delirium, infection/metabolic abnormalities  R MCA CVA complicated with with SAH  and midline shift, cerebral edema  cont neuro checks  SBP goal < 160 Avoid sedation tx infection as outlined   Sepsis due to staph a. PNA -possible staph epi bacteremia but more like contaminant (drawn from same site) Patient's white count keeps climbing up, today is 19, she is having temperature spikes Continue cefazolin to complete treatment for MSSA pneumonia Repeat blood culture to make sure it was contaminant Repeat respiratory culture and UA  AKI Induced hypernatremia, improved   (iatrogenic, was previously on hypertonic) AKI has improved Continue FWF 200 ml  HfpEF  Ejection fraction has improved to 55% on latest echo in May 2022 She does have some diastolic dysfunction Continue hydralazine, Imdur and metoprolol  HTN SBP goal <160 Continue amlodipine, clonidine, hydralazine, metop, PRN labetalol   Large uterine fibroids Acute on chronic anemia- multifactorial and stable  -iron deficiency anemia, ABLA  Continue iron supplements transfuse for hgb < 7 Outpatient follow-up with OB/GYN  Hyperlipidemia Continue atorvastatin  Dysphagia due to acute stroke Continue tube feeds, she may require PEG placement if she continue to fails swallowing trial  Flatulence/abdominal cramping Loose stool -does not seem very concerning for GI infection at present but will continue to monitor (more likely EN + FWF + GI upset from abx)  P -PRN simethicone -if needed, PRN imodium  -can also consider a bulking agent   Best practice (right click and "Reselect all SmartList Selections" daily)  Diet:  Tube Feed , Pain/Anxiety/Delirium protocol (if indicated): Yes (RASS goal 0) VAP protocol (if indicated): Yes DVT prophylaxis: SCD GI prophylaxis: PPI Glucose control:  SSI No Central venous access:  N/A Arterial line:  N/A Foley:  N/A Mobility: As tolerated PT consulted: Yes Last date of multidisciplinary goals of care discussion [per primary.] Code Status:  full  code Disposition: ICU  Labs   CBC: Recent Labs  Lab 05/24/21 0445 05/25/21 0235 05/26/21 0244 05/27/21 0314 05/28/21 0430  WBC 15.4* 18.1* 15.5* 17.7* 19.4*  HGB 7.3* 7.6* 7.5* 9.1* 10.0*  HCT 25.5* 26.4* 26.2* 31.7* 35.2*  MCV 74.8* 74.8* 76.2* 76.0* 77.2*  PLT 265 293 314 392 427*    Basic Metabolic Panel: Recent Labs  Lab 05/24/21 0445 05/24/21 1016 05/25/21 0235 05/26/21 0244 05/27/21 0314 05/28/21 0430  NA 150* 150* 150* 147* 142 140  K 4.2  --  4.2 4.1 4.2 5.3*  CL 116*  --  121* 114* 113* 109  CO2 24  --  24 23 22  21*  GLUCOSE 124*  --  104* 118* 111* 101*  BUN 24*  --  25* 25* 27* 26*  CREATININE 1.43*  --  1.45* 1.50* 1.43* 1.30*  CALCIUM 9.0  --  9.2 9.3 8.6* 9.1   GFR: Estimated Creatinine Clearance: 54.1 mL/min (A) (by C-G formula based on SCr of 1.3 mg/dL (H)). Recent Labs  Lab 05/25/21 0235 05/26/21 0244 05/27/21 0314 05/28/21 0430  WBC 18.1* 15.5* 17.7* 19.4*    Liver Function Tests: No results for input(s): AST, ALT, ALKPHOS, BILITOT, PROT, ALBUMIN in the last 168 hours.  No results for  input(s): AMMONIA in the last 168 hours.  ABG    Component Value Date/Time   PHART 7.333 (L) 05/17/2021 1817   PCO2ART 33.7 05/17/2021 1817   PO2ART 156 (H) 05/17/2021 1817   HCO3 17.6 (L) 05/17/2021 1817   TCO2 19 (L) 05/17/2021 1817   ACIDBASEDEF 7.0 (H) 05/17/2021 1817   O2SAT 99.0 05/17/2021 1817     HbA1C: Hgb A1c MFr Bld  Date/Time Value Ref Range Status  05/16/2021 04:57 AM 5.4 4.8 - 5.6 % Final    Comment:    (NOTE)         Prediabetes: 5.7 - 6.4         Diabetes: >6.4         Glycemic control for adults with diabetes: <7.0     CBG: Recent Labs  Lab 05/27/21 1522 05/27/21 1915 05/27/21 2309 05/28/21 0307 05/28/21 0721  GLUCAP 114* 110* 107* 105* 122*    Total critical care time: 39 minutes  Performed by: Sellers care time was exclusive of separately billable procedures and treating other patients.    Critical care was necessary to treat or prevent imminent or life-threatening deterioration.   Critical care was time spent personally by me on the following activities: development of treatment plan with patient and/or surrogate as well as nursing, discussions with consultants, evaluation of patient's response to treatment, examination of patient, obtaining history from patient or surrogate, ordering and performing treatments and interventions, ordering and review of laboratory studies, ordering and review of radiographic studies, pulse oximetry and re-evaluation of patient's condition.   Jacky Kindle MD [facility] See Amion for pager If no response to pager, please call [phone] until 7pm After 7pm, Please call E-link [phone]

## 2021-05-28 NOTE — Progress Notes (Signed)
STROKE TEAM PROGRESS NOTE   INTERVAL HISTORY No family at the bedside. Pt is still requiring ventilatory support on tracheostomy but plans are to try to wean her today..  Neuro stable, she is drowsy but does open eyes but is not consistently following commands.  Vital signs are stable.  Continues to have low-grade fever.  White count continues to rise and is 19.4 today temperature spikes.  Vitals:   05/28/21 1000 05/28/21 1100 05/28/21 1102 05/28/21 1200  BP: (!) 167/84 (!) 165/107    Pulse: (!) 102 99 (!) 101   Resp: (!) 24 (!) 24    Temp:    99.6 F (37.6 C)  TempSrc:    Oral  SpO2: 97% 96% 96%   Weight:      Height:       CBC:  Recent Labs  Lab 05/27/21 0314 05/28/21 0430  WBC 17.7* 19.4*  HGB 9.1* 10.0*  HCT 31.7* 35.2*  MCV 76.0* 77.2*  PLT 392 465*   Basic Metabolic Panel:  Recent Labs  Lab 05/27/21 0314 05/28/21 0430  NA 142 140  K 4.2 5.3*  CL 113* 109  CO2 22 21*  GLUCOSE 111* 101*  BUN 27* 26*  CREATININE 1.43* 1.30*  CALCIUM 8.6* 9.1   Lipid Panel:  Recent Labs  Lab 05/24/21 0447  TRIG 81   HgbA1c:  No results for input(s): HGBA1C in the last 168 hours. Urine Drug Screen:  No results for input(s): LABOPIA, COCAINSCRNUR, LABBENZ, AMPHETMU, THCU, LABBARB in the last 168 hours.  Alcohol Level  No results for input(s): ETH in the last 168 hours.  IMAGING  CT HEAD WO CONTRAST Result Date: 05/16/2021  IMPRESSION:  1. Continued interval evolution of moderately large right MCA distribution infarct. Associated regional mass effect has mildly increased, with new 2 mm of right-to-left midline shift. No hydrocephalus or trapping.  2. Decreased prominence of subarachnoid hemorrhage at the posterior right Sylvian fissure and overlying right frontoparietal region. Trace intraventricular hemorrhage related to redistribution.  3. Question new area of parenchymal hypodensity involving the lateral left cerebellum. While this finding is favored to be artifactual, a  possible new acute ischemic infarct is difficult to exclude, and could be considered in the correct clinical setting. Attention at follow-up recommended.  4. No other new acute intracranial abnormality.   CT HEAD WO CONTRAST Result Date: 05/15/2021  IMPRESSION: Small-to-moderate volume hyperdensity within the right sylvian fissure and along portions of the right frontoparietal and temporal lobes compatible with extravasated contrast/subarachnoid hemorrhage. There is hyperdensity within the right basal ganglia, right insula, lateral right frontal lobe/frontal operculum and within portions of the right parietal lobe likely reflecting contrast staining at sites of acute infarction. Mild associated mass effect with subtle partial effacement of the right lateral ventricle. No midline shift or hydrocephalus. Embolization material now overlies the lateral right parietal lobe. Stable background parenchymal atrophy and chronic small vessel ischemic disease. Redemonstrated chronic infarct within the superior left cerebellum.   MR BRAIN WO CONTRAST Result Date: 05/15/2021 IMPRESSION:  1. Moderately large acute right MCA infarct. Associated petechial hemorrhage, subarachnoid hemorrhage, and minimal intraventricular hemorrhage.  2. Severe chronic small vessel ischemic disease with multiple chronic infarcts.   MR CERVICAL SPINE WO CONTRAST Result Date: 05/15/2021  IMPRESSION: The cord itself appears normal. No disc disease. No spinal stenosis. 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 No significant encroachment upon the neural spaces. Some prevertebral soft tissue edema. In this clinical scenario, this may be subsequent  to previous endotracheal intubation. One could not rule out pharyngitis or cellulitis but that seems less likely. I do not see any traumatic injury to the cervical spine itself. Electronically Signed   By: Nelson Chimes M.D.   On: 05/15/2021 16:05   IR CT Head Ltd Result Date:  05/15/2021 CT of the head was obtained and post processed in a separate workstation with concurrent attending physician supervision. Selected images were sent to PACS. Small right sylvian contrast extravasation confirmed. Additionally, hyperdensity of the right basal ganglia and lateral right frontal cortex are noted, related to ongoing ischemia. Right internal carotid artery angiograms with frontal and lateral views of the head showed occlusion of the embolized branch with no evidence of contrast extravasation. Patency of the remaining MCA branches noted.  IR PERCUTANEOUS ART THROMBECTOMY/INFUSION INTRACRANIAL INC DIAG ANGIO Result Date: 05/15/2021 IMPRESSION: Mechanical thrombectomy performed for treatment of right M1/MCA occlusion with clot fragmentation and embolization to same distal territory requiring multiple additional passes. Procedure complicated by contrast extravasation at a distal right M3/MCA branch requiring vessel embolization. Near complete recanalization of the right MCA vascular tree was achieved (TICI 2b). PLAN: - Bed rest post femoral access x6h - Follow-up CT in 4 hours to evaluate for SAH/contrast extravasation stability - Transfer to ICU- SBP: 120-140 mmHg   Echo Result read: 05/16/2021 1. Left ventricular ejection fraction, by estimation, is 55 to 60%. The  left ventricle has normal function. The left ventricle has no regional  wall motion abnormalities. There is moderate concentric left ventricular  hypertrophy. Left ventricular  diastolic parameters are consistent with Grade II diastolic dysfunction  (pseudonormalization). Elevated left atrial pressure.  2. Right ventricular systolic function is normal. The right ventricular  size is normal. Mildly increased right ventricular wall thickness. There  is moderately elevated pulmonary artery systolic pressure. The estimated  right ventricular systolic pressure  is 22.2 mmHg.  3. Left atrial size was severely dilated.  4.  Right atrial size was severely dilated.  5. There is no evidence of cardiac tamponade.  6. The mitral valve is normal in structure. Trivial mitral valve  regurgitation. No evidence of mitral stenosis.  7. The aortic valve is normal in structure. There is mild calcification  of the aortic valve. There is mild thickening of the aortic valve. Aortic  valve regurgitation is not visualized. Mild aortic valve sclerosis is  present, with no evidence of aortic  valve stenosis.  8. The inferior vena cava is dilated in size with <50% respiratory  variability, suggesting right atrial pressure of 15 mmHg.   PHYSICAL EXAM  Temp:  [98.7 F (37.1 C)-100.1 F (37.8 C)] 99.6 F (37.6 C) (06/06 1200) Pulse Rate:  [82-106] 101 (06/06 1102) Resp:  [19-27] 24 (06/06 1100) BP: (114-168)/(65-107) 165/107 (06/06 1100) SpO2:  [96 %-100 %] 96 % (06/06 1102) FiO2 (%):  [21 %-28 %] 21 % (06/06 1102) Weight:  [74.6 kg] 74.6 kg (06/06 0500)  General - Well nourished, well developed middle-aged African-American lady, s/p trach.  Ophthalmologic - fundi not visualized due to noncooperation.  Cardiovascular - Regular rhythm and rate  Neuro -   s/p trach on ventilator., eyes closed, will partially open with voice stimulation,   able to follow commands on the right hand and foot occasionally.. With forced eye opening, eyes in right forced gaze preference, not blinking to visual threat on the left, not tracking, PERRL. B/l eyelids mildly swollen. Corneal reflex present, gag and cough present.  Mild left lower facial asymmetry.  Tongue protrusion not cooperative. On pain stimulation, left UE and LE flaccid. Right UE proximal 1/5 and distal finger movement 2+/5 and RLE rigorously movement in bed with stimulation. No babinski. Sensation, coordination not cooperative and gait not tested.   ASSESSMENT/PLAN Ms. Odile Veloso is a 45 y.o. female with history of uncontrolled HTN, tobacco use disorder, large uterine  fibroids, heart failure, paroxysmal SVT, paranoid psychosis who presents with altered mental status after being found down.  CT head initially read as small acute appearing perforator infarct in the R BG and notable for severe small vessel ischemia. However concerning for potential R MCA stroke and thus CTA head and neck and CTP was obtained with a R MCA M1 occlusion and a core of 50cc and 98ml of penumbra.   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   CTA neck: LVO right M1 origin. Left vertebral occlusion at its origin. High-grade narrowings of the right cavernous ICA.  CT perfusion suggestive 50 cc core infarct and 50 cc of adjacent penumbra.  MRI: large acute right MCA infarct. Associated petechial hemorrhage, subarachnoid hemorrhage, and minimal intraventricular hemorrhage.  CT 5/28 stable right MCA infarct with MLS  CT 5/30 stable right MCA infarct and MLS 90mm  2D Echo 55 to 60%  Further cardioembolic work up once pt neuro improves  Hypercoagulable panel pending.  Homocystine is normal.    UDS: negative  LDL 76  HgbA1c 5.4  UDS neg  Sickle cell screen - neg, ANA neg  VTE prophylaxis - SCDs  No antithrombotic prior to admission, now on ASA 81.  Therapy recommendations:  CIR  Disposition:  Pending  Cytotoxic cerebral edema  On 3% saline @ 30 -> FW 100 Q4. Stopped. Now on free water through [facility]  Na 157->156->157->155->157->153->150->150 >147  CT head 5/30 stable right MCA infarcts and MLS 56mm  OK to gradually normalize Na level  Respiratory failure  Intubated 5/26 for increased work of breathing with altered mental status   Management per CCM, appreciated  Switched to precedex -> now off   Not candidate for extubation  S/p trach 6/3 with CCM, Trach collar  today  Acute blood loss anemia Large uterine fibroids  hgb 7.8->5.9->PRBC->7.6->6.5->PRBC->7.2->6.7->PRBC->8.5->7.7->7.3->7.6 >7.5>9/1  PRBC tranfusion x 3  extensive virginal bleeding from extremely large abdominopelvic mass measuring up to 22.9 cm noted on CT 5/26  Has been followed with Dr. Jonna Clark - poor surgical candidate due to uncontrolled HTN and SVT  Abdominal distention on exam  Increase Megace to megace 80mg  tid  OBGYN Dr. Rosana Hoes consulted, appreciate help - not surgical candidate  Dysphagia  Likely due to stroke  SLP following  cortrak placement  On TF @ 50 and FW  Paroxysmal SVT history Hx of CHF  Followed with cardiology   EF 40-45% in 04/2020 and 20-25% in 12/2020  This admission EF 55-60%  on amiodarone 200mg  daily and isosorbide 20 tid  Currently heart rate controlled  Consider TEE/Loop recorder prior to discahrge if able to tolerate  Hypertension  Home meds:  Hydralazine 100mg  TID, Imdur 60mg  daily, Toprol XL 200mg  daily  Off Cleviprex now  Amlodipine 10mg  daily -> 5mg  bid  clonidine 0.2 tid -> 0.1 Q8h  Hydralazine 100mg  tid    imdur 20mg  tid -> Q8h  Metoprolol 100mg  bid  SBP goal to <  160 mmHg  Labetalol prn . Long-term BP goal normotensive  Hyperlipidemia  Home meds:  none  LDL 76, goal < 70  Atorvastatin 40mg  daily  Continue statin at discharge  Tobacco abuse  Current smoker  Smoking cessation counseling will be provided  Fever and Leukocytosis  WBC 12.7->14.0->23.1->20.9->20.6->19.1->16.8->15.4->18.1 >15.5>17.7   Afebrile for the past 24 h  Sputum culture - staph Aures   Blood culture G+ cocci in clusters  CXR LLL atx vs. pneumonia  CCM on board  On ancef   Other Stroke Risk Factors  Coronary artery disease  Other Active Problems  AKI: creatinine 1.27->1.48->1.54->1.51->1.52->1.75->1.39->1.43->1.45 >1.5>1.43  Lethargic - added provigil by CCM   hospital day #13 Patient had tracheostomy  and still requiring ventilatory support but hopefully will wean to trach collar as tolerated over the next few days.  Speech therapy to evaluate swallowing ability but she will likely need PEG tube.  No family available at the bedside for discussion.  Discussed with Dr. Tacy Learn critical care medicine and speech therapist. This patient is critically ill due to respiratory failure s/p trach, right MCA infarct with hemorrhagic conversion, bacteremia, severe anemia, leukocytosis and AKI and at significant risk of neurological worsening, death form recurrent stroke, sepsis, shock with severe anemia, cerebral edema. This patient's care requires constant monitoring of vital signs, hemodynamics, respiratory and cardiac monitoring, review of multiple databases, neurological assessment, discussion with  other specialists and medical decision making of high complexity.  I spent 35 minutes of neuro critical care time in the care of this patient.   Antony Contras, MD Stroke Neurology 05/28/2021 1:37 PM     To contact Stroke Continuity provider, please refer to [email or web address] After hours, contact General Neurology

## 2021-05-28 NOTE — Progress Notes (Signed)
  Speech Language Pathology Treatment: Cognitive-Linquistic  Patient Details Name: Holly Bradley MRN: [id number] DOB: 05-10-1976 Today's Date: 05/28/2021 Time: 2449-7530 SLP Time Calculation (min) (ACUTE ONLY): 9 min  Assessment / Plan / Recommendation Clinical Impression  Holly Bradley seen for cognitive treatment during and after evaluation with PMV. No attempts at verbal responses however she responded to yes/no questions via head gestures. Once wakened, pt intermittently looked to her right side, neglecting her left but leaning to left side with eyes closed. Responded no with head shake when asked if she had a stroke. Other responses pertained to her environment/comfort (positioned ok?, sleepy? etc). As she is able to participate with greater level of consciousness, her cognition and verbal communication is expected to improve. May attempt to co-tx with PT/OT next session.   HPI HPI: Pt is a [name] who presents with AMS and slurred speech after being found down. MRI showing moderately large acute right MCA infarct with associated petechial hemorrhage, SAH, and minimal IVH. S/p mechanical thrombectomy of M1/M2/M3 and embolization of right MCA M3 branch. Significant PMH: uncontrolled HTN, tobacco use disorder, HF, paroxysmal SVT, paranoid psychosis      SLP Plan  Continue with current plan of care  Patient needs continued Speech [place] Pathology Services    Recommendations         Patient may use Passy-Muir Speech Valve: with SLP only PMSV Supervision: Full MD: Please consider changing trach tube to : Cuffless         General recommendations: Rehab consult Oral Care Recommendations: Oral care QID Follow up Recommendations: Inpatient Rehab;24 hour supervision/assistance SLP Visit Diagnosis: Cognitive communication deficit (Y51.102) Plan: Continue with current plan of care                       [name] 05/28/2021, 1:52 PM [name].Ed  Risk analyst [phone] Office [phone]

## 2021-05-28 NOTE — Progress Notes (Signed)
eLink Physician-Brief Progress Note Patient Name: Diavian Furgason DOB: [birth year] MRN: [id number]   Date of Service  05/28/2021  HPI/Events of Note  Flexiseal requested due to frequent loose stools. Platelets wnl.   eICU Interventions  Entered order for Flexiseal.     Intervention Category Minor Interventions: Routine modifications to care plan (e.g. PRN medications for pain, fever)  Marily Lente Kristofer Schaffert 05/28/2021, 1:30 AM

## 2021-05-28 NOTE — Evaluation (Signed)
Passy-Muir Speaking Valve - Evaluation Patient Details  Name: Holly Bradley MRN: [id number] Date of Birth: 1976-01-14  Today's Date: 05/28/2021 Time: 2263-3354 SLP Time Calculation (min) (ACUTE ONLY): 9 min  Past Medical History:  Past Medical History:  Diagnosis Date  . Hypertension    Past Surgical History:  Past Surgical History:  Procedure Laterality Date  . IR ANGIOGRAM FOLLOW UP STUDY  05/15/2021  . IR CT HEAD LTD  05/15/2021  . IR PERCUTANEOUS ART THROMBECTOMY/INFUSION INTRACRANIAL INC DIAG ANGIO  05/15/2021      . IR PERCUTANEOUS ART THROMBECTOMY/INFUSION INTRACRANIAL INC DIAG ANGIO  05/15/2021  . IR TRANSCATH/EMBOLIZ  05/15/2021  . RADIOLOGY WITH ANESTHESIA N/A 05/15/2021   Procedure: IR WITH ANESTHESIA;  Surgeon: Radiologist, Medication, MD;  Location: [facility];  Service: Radiology;  Laterality: N/A;   HPI:  Pt is a [name] who presents with AMS and slurred speech after being found down. MRI showing moderately large acute right MCA infarct with associated petechial hemorrhage, SAH, and minimal IVH. S/p mechanical thrombectomy of M1/M2/M3 and embolization of right MCA M3 branch. Significant PMH: uncontrolled HTN, tobacco use disorder, HF, paroxysmal SVT, paranoid psychosis   Assessment / Plan / Recommendation Clinical Impression  Pt opened eyes eventually responding to most questions with head gestures. She did not attempt to mouth or phonate during assessment. Incomplete exhalation suspected evidenced by valve falling off trach hub and audible air relaeased upon SLP removal. Saliva spilling from left side of oral cavity when she leaned to left side. Vital signs remained within normal parameters and therapist was unable to elicit vocalization with cues. She appeared fatigued and wanted to sleep after attempts to initiate throat clear/phonation. PMV with SLP only at this time. SLP Visit Diagnosis: Aphonia (R49.1)    SLP Assessment  Patient needs continued Speech Lanaguage  Pathology Services    Follow Up Recommendations  Inpatient Rehab;24 hour supervision/assistance    Frequency and Duration min 2x/week  2 weeks    PMSV Trial PMSV was placed for: intervals up to 30 sec Able to redirect subglottic air through upper airway: Yes Able to Attain Phonation: No attempt to phonate Able to Expectorate Secretions: Yes Level of Secretion Expectoration with PMSV: Tracheal;Oral Intelligibility: Unable to assess (comment) Respirations During Trial: 21 SpO2 During Trial: 99 % Pulse During Trial: 100 Behavior: Lethargic;Poor eye contact;Responsive to questions   Tracheostomy Tube       Vent Dependency  FiO2 (%): 21 %    Cuff Deflation Trial  GO Tolerated Cuff Deflation: Yes Length of Time for Cuff Deflation Trial: 20 min Behavior: Poor eye contact;Responsive to questions (most, with gestures)        [name] 05/28/2021, 1:49 PM  [name].Ed Risk analyst [phone] Office [phone]

## 2021-05-28 NOTE — Telephone Encounter (Signed)
Outpatient HF CSW was following along with inpatient stay as patient is enrolled in [employer] program through the [facility].  At this time pt is critically ill and disposition currently projected to be [facility] or SNF due to pt high level of needs at this time.  Due to pt high acuity and inability to return home at this time will DC from paramedicine program- pt can be re- referred if she becomes stable to return home and is still in need of services.  Jorge Ny, LCSW Clinical Social Worker [facility] Desk#: [phone] Cell#: [phone]

## 2021-05-29 DIAGNOSIS — D500 Iron deficiency anemia secondary to blood loss (chronic): Secondary | ICD-10-CM

## 2021-05-29 LAB — CBC
HCT: 30.9 % — ABNORMAL LOW (ref 36.0–46.0)
Hemoglobin: 8.8 g/dL — ABNORMAL LOW (ref 12.0–15.0)
MCH: 21.6 pg — ABNORMAL LOW (ref 26.0–34.0)
MCHC: 28.5 g/dL — ABNORMAL LOW (ref 30.0–36.0)
MCV: 75.9 fL — ABNORMAL LOW (ref 80.0–100.0)
Platelets: 460 10*3/uL — ABNORMAL HIGH (ref 150–400)
RBC: 4.07 MIL/uL (ref 3.87–5.11)
RDW: 27.7 % — ABNORMAL HIGH (ref 11.5–15.5)
WBC: 17.1 10*3/uL — ABNORMAL HIGH (ref 4.0–10.5)
nRBC: 0 % (ref 0.0–0.2)

## 2021-05-29 LAB — GLUCOSE, CAPILLARY
Glucose-Capillary: 120 mg/dL — ABNORMAL HIGH (ref 70–99)
Glucose-Capillary: 124 mg/dL — ABNORMAL HIGH (ref 70–99)
Glucose-Capillary: 128 mg/dL — ABNORMAL HIGH (ref 70–99)
Glucose-Capillary: 104 mg/dL — ABNORMAL HIGH (ref 70–99)
Glucose-Capillary: 125 mg/dL — ABNORMAL HIGH (ref 70–99)
Glucose-Capillary: 114 mg/dL — ABNORMAL HIGH (ref 70–99)

## 2021-05-29 LAB — BASIC METABOLIC PANEL
Anion gap: 8 (ref 5–15)
BUN: 25 mg/dL — ABNORMAL HIGH (ref 6–20)
CO2: 21 mmol/L — ABNORMAL LOW (ref 22–32)
Calcium: 9.3 mg/dL (ref 8.9–10.3)
Chloride: 108 mmol/L (ref 98–111)
Creatinine, Ser: 1.18 mg/dL — ABNORMAL HIGH (ref 0.44–1.00)
GFR, Estimated: 57 mL/min — ABNORMAL LOW (ref >60)
Glucose, Bld: 112 mg/dL — ABNORMAL HIGH (ref 70–99)
Potassium: 4.4 mmol/L (ref 3.5–5.1)
Sodium: 137 mmol/L (ref 135–145)

## 2021-05-29 MED ORDER — LABETALOL HCL 5 MG/ML IV SOLN
10.0000 mg | Freq: Once | INTRAVENOUS | Status: AC
  Administered 2021-05-29: 10 mg via INTRAVENOUS
  Filled 2021-05-29: qty 4

## 2021-05-29 MED ORDER — LACTATED RINGERS IV BOLUS
500.0000 mL | Freq: Once | INTRAVENOUS | Status: AC
  Administered 2021-05-29: 500 mL via INTRAVENOUS

## 2021-05-29 MED ORDER — FREE WATER
200.0000 mL | Freq: Four times a day (QID) | Status: DC
  Administered 2021-05-29 – 2021-06-15 (×67): 200 mL

## 2021-05-29 MED ORDER — ISOSORBIDE DINITRATE 20 MG PO TABS
40.0000 mg | ORAL_TABLET | Freq: Three times a day (TID) | ORAL | Status: DC
  Administered 2021-05-29 – 2021-06-21 (×70): 40 mg
  Filled 2021-05-29 (×73): qty 2

## 2021-05-29 NOTE — Progress Notes (Signed)
  Speech Language Pathology Treatment: Cognitive-Linquistic;Passy Muir Speaking valve  Patient Details Name: Holly Bradley MRN: [id number] DOB: 16-Feb-1976 Today's Date: 05/29/2021 Time: 3825-0539 SLP Time Calculation (min) (ACUTE ONLY): 15 min  Assessment / Plan / Recommendation Clinical Impression  Jaicey focused her attention when name called to therapist on her right but could not bring eyes to midline to locate SLP on her left. Nodded appropriately to yes/no questions pertaining to comfort and environment. No attempts at verbal communication.  Pt was unable to volitionally close lips, move tongue or lips on command or during functional movement (rubbing lip ointment). Saliva aggregates in anterior lower sulci and leaks bilaterally. Air was unable to mobilize to upper airway with PMV and pushed valve from trach after 1-5 seconds. She needed intermittent tactile stimuli at end of session. ST will continue to move toward verbalization, oral intake, however she will need longer term nutrition at this point.     HPI HPI: Pt is a [name] who presents with AMS and slurred speech after being found down. MRI showing moderately large acute right MCA infarct with associated petechial hemorrhage, SAH, and minimal IVH. S/p mechanical thrombectomy of M1/M2/M3 and embolization of right MCA M3 branch. Significant PMH: uncontrolled HTN, tobacco use disorder, HF, paroxysmal SVT, paranoid psychosis      SLP Plan  Continue with current plan of care       Recommendations         Patient may use Passy-Muir Speech Valve: with SLP only PMSV Supervision: Full MD: Please consider changing trach tube to : Cuffless         Oral Care Recommendations: Oral care QID Follow up Recommendations: Inpatient Rehab;24 hour supervision/assistance SLP Visit Diagnosis: Cognitive communication deficit (J67.341) Plan: Continue with current plan of care       Zena, Jasleen Riepe  Willis 05/29/2021, 1:09 PM

## 2021-05-29 NOTE — Progress Notes (Signed)
NAME:  Holly Bradley, MRN:  [id number], DOB:  1976-09-16, LOS: 82  ADMISSION DATE:  05/15/2021, CONSULTATION DATE:  05/29/21 REFERRING MD:  Holly Bradley, CHIEF COMPLAINT:  Respiratory failure   History of Present Illness:  Holly Bradley is a [name] with PMH of HTN, tobacco use, HFpF, DMT2 (controlled with diet and exercise), pSVT, uterine fibroids, paranoid psychosis who presented with AMS on 5/24.   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  On follow up imaging, she had stable appearing SAH and continued R basal ganglia infarct with 54mm R to L shift.   She was admitted to the Neuro ICU and treating with hypertonic saline, statin and BP control.  On 5/26, PCCM consulted when pt became acutely less responsive with respiratory distress along with abdominal distension worse than baseline, per patient.  She was intubated and repeat head CT, abd/pelvis CT ordered.  Repeat head CT on 5/26 showed increased cytotoxic edema and mildly increased leftward midline shift, now 5 mm, decreased SAH and IVH.  Abdominal CT showing 22.9 cm mass, possibly consistent with uterine fibroids, no lymphadenopathy.      Significant Hospital Events: Including procedures, antibiotic start and stop dates in addition to other pertinent events   . 5/24 Admit to neurology after ED visit with AMS, found to have R MCA stroke, underwent mechanical thrombectomy, M3 coil embolization, subsequent SAH . 5/26 decreased mental status and respiratory distress, intubated  . 6/1 still not candidate for extubation with poor mentation and cough mechanics. Family unsure if pt would want trach. Staph epi bacteremia, continues on ancef  . 6/2 moving RUE RLE following some commands. decision to move forward with tracheostomy.   . 6/3 perc trach. Slight WBC increase  but no fever, continues on ancef  . 6/4 Tolerating PSV/CPAP  . 6/5 WBC up to 17, last day of ancef for staph pna. Trying ATC   Interim History / Subjective:  Patient remained on trach collar for more than 48 hours White count trended down from 19-17 UA was negative, blood cultures pending  Objective   Blood pressure (!) 163/75, pulse (!) 114, temperature 99.6 F (37.6 C), temperature source Axillary, resp. rate (!) 24, height 5\' 5"  (1.651 m), weight 74 kg, SpO2 97 %.    FiO2 (%):  [21 %] 21 %   Intake/Output Summary (Last 24 hours) at 05/29/2021 0753 Last data filed at 05/29/2021 0530 Gross per 24 hour  Intake 2500 ml  Output 850 ml  Net 1650 ml   Filed Weights   05/27/21 0400 05/28/21 0500 05/29/21 0450  Weight: 76.5 kg 74.6 kg 74 kg   General: Acutely ill appearing middle aged F, s/p trach  HEENT: Trach secure, anicteric sclera. Pink mm.  No JVD Neuro: Opens eyes with vocal stimuli, following simple commands, antigravity on right side, R sided gaze does not cross midline, left lower extremity triple flexion, left upper extremity flickering movement CV: Tachycardic, regular rhythm, s1s2 no rgm cap refill brisk  PULM: Coarse breath sounds all over, no rhonchi, wheezes or crackles   GI: Soft abdomen  + bowel sounds, noon-tender  Skin : c/d/w no rash   Labs/imaging that I havepersonally reviewed  (right click and "Reselect all SmartList Selections" daily)   CT Head 5/30: Stable R MCA infarct. 5 mm R to L midline shift (no significant change).  KUB  6/5 non-obstructive gas  6/7: Na 137 Cr 1.1 WBC 17 hgb 8.8   Assessment & Plan:   Acute respiratory failure with hypoxia S/p tracheostomy MSSA pneumonia Patient is tolerating trach collar for more than 48 hours Continue routine post-trach care Patient completed treatment with cefazolin for MSSA pneumonia  Acute encephalopathy Multifactorial, in setting of intracranial process below, delirium, infection/metabolic abnormalities   R MCA CVA complicated with with SAH and midline shift, cerebral edema  cont neuro checks  SBP goal < 160 Avoid sedation  Sepsis due to staph a. PNA, resolved Repeat respiratory culture showed rare gram-positive cocci in pairs UA is negative Blood cultures are pending Patient completed therapy for MSSA pneumonia  AKI, improved Induced hypernatremia, resolved AKI has improved, serum creatinine is trending down now it is 1.1 Serum sodium is normal now at 137 Decrease free water flushes to 200 every 6 hours  HfpEF  Ejection fraction has improved to 55% on latest echo in May 2022 She does have some diastolic dysfunction Continue Coreg, hydralazine, Imdur and Entresto  Uncontrolled HTN SBP goal <160 Patient blood pressure remains elevated despite multiple medications Continue amlodipine, hydralazine, Coreg, Entresto and increase Imdur to 40 mg twice daily  Large uterine fibroids Acute on chronic anemia- multifactorial and stable  -iron deficiency anemia, ABLA  Continue iron supplements transfuse for hgb < 7 Outpatient follow-up with OB/GYN  Hyperlipidemia Continue atorvastatin  Dysphagia due to acute stroke Continue tube feeds, she may require PEG placement if she continue to fails swallowing trial  Flatulence/abdominal cramping Loose stool Stool frequency is improving after stopping stool softener Continue simethicone as needed  Best practice (right click and "Reselect all SmartList Selections" daily)  Diet:  Tube Feed , Pain/Anxiety/Delirium protocol (if indicated): N/A VAP protocol (if indicated): N/A DVT prophylaxis: SCD GI prophylaxis: PPI Glucose control:  SSI No Central venous access:  N/A Arterial line:  N/A Foley:  N/A Mobility: As tolerated PT consulted: Yes Last date of multidisciplinary goals of care discussion [per primary.] Code Status:  full code Disposition: Per primary team  Labs   CBC: Recent Labs  Lab 05/25/21 0235 05/26/21 0244  05/27/21 0314 05/28/21 0430 05/29/21 0254  WBC 18.1* 15.5* 17.7* 19.4* 17.1*  HGB 7.6* 7.5* 9.1* 10.0* 8.8*  HCT 26.4* 26.2* 31.7* 35.2* 30.9*  MCV 74.8* 76.2* 76.0* 77.2* 75.9*  PLT 293 314 392 427* 460*    Basic Metabolic Panel: Recent Labs  Lab 05/25/21 0235 05/26/21 0244 05/27/21 0314 05/28/21 0430 05/29/21 0254  NA 150* 147* 142 140 137  K 4.2 4.1 4.2 5.3* 4.4  CL 121* 114* 113* 109 108  CO2 24 23 22  21* 21*  GLUCOSE 104* 118* 111* 101* 112*  BUN 25* 25* 27* 26* 25*  CREATININE 1.45* 1.50* 1.43* 1.30* 1.18*  CALCIUM 9.2 9.3 8.6* 9.1 9.3   GFR: Estimated Creatinine Clearance: 59.4 mL/min (A) (by C-G formula based on SCr of 1.18 mg/dL (H)). Recent Labs  Lab 05/26/21 0244 05/27/21 0314 05/28/21 0430 05/29/21 0254  WBC 15.5* 17.7* 19.4* 17.1*    Liver Function Tests: No results for input(s): AST, ALT, ALKPHOS, BILITOT, PROT, ALBUMIN in the last 168 hours.  No results for input(s): AMMONIA in the last 168 hours.  ABG    Component Value Date/Time   PHART 7.333 (L) 05/17/2021 1817   PCO2ART 33.7 05/17/2021 1817   PO2ART 156 (H) 05/17/2021 1817   HCO3 17.6 (L) 05/17/2021 1817   TCO2 19 (L) 05/17/2021 1817   ACIDBASEDEF  7.0 (H) 05/17/2021 1817   O2SAT 99.0 05/17/2021 1817     HbA1C: Hgb A1c MFr Bld  Date/Time Value Ref Range Status  05/16/2021 04:57 AM 5.4 4.8 - 5.6 % Final    Comment:    (NOTE)         Prediabetes: 5.7 - 6.4         Diabetes: >6.4         Glycemic control for adults with diabetes: <7.0     CBG: Recent Labs  Lab 05/28/21 1520 05/28/21 1921 05/28/21 2305 05/29/21 0311 05/29/21 0706  GLUCAP 115* 115* 116* 120* 124*    Total critical care time: 32 minutes  Performed by: [place] care time was exclusive of separately billable procedures and treating other patients.   Critical care was necessary to treat or prevent imminent or life-threatening deterioration.   Critical care was time spent personally by me on  the following activities: development of treatment plan with patient and/or surrogate as well as nursing, discussions with consultants, evaluation of patient's response to treatment, examination of patient, obtaining history from patient or surrogate, ordering and performing treatments and interventions, ordering and review of laboratory studies, ordering and review of radiographic studies, pulse oximetry and re-evaluation of patient's condition.   Jacky Kindle MD [facility] See Amion for pager If no response to pager, please call [phone] until 7pm After 7pm, Please call E-link [phone]

## 2021-05-29 NOTE — Progress Notes (Signed)
Occupational Therapy Treatment Patient Details Name: Holly Bradley MRN: [id number] DOB: [birth year] Today's Date: 05/29/2021    History of present illness 45 yo female s/p mechanical thrombectomy on 5/24 of right MCA M1/M2/M3 and embolization of right MCA M3 branch due to contrast extravasation. Pt re-intubated 5/26. Repeat head CT on 5/26 showed increased cytotoxic edema and mildly increased leftward midline shift, now 5 mm, decreased SAH and IVH. Abdominal CT showing 22.9 cm mass, possibly consistent with uterine fibroids, no lymphadenopathy. PMH fibriods HTN HF paranoid psychosis recent hospitalizations january and february 2022   OT comments  Pt with increased secretions with EOB sitting from mouth nose and mouth. Pt appropriately attempting to wipe at face and provided wash cloth. Pt with trach gauze changed due to saturation. Pt total (A) eob with flexed posture. Pt coughing increased with neck extension question if trach positioning partially source. Neck rotation toward midline and L attempted with some discomfort noted in patient. Recommendation for SNf at this time will continue to montior for CIR appropriateness. Pt is following 1 step commmands ( thumbs up stick tongue out high five touch nose)  Follow Up Recommendations  SNF (pending further progression)    Equipment Recommendations  Wheelchair (measurements OT);Wheelchair cushion (measurements OT);Hospital bed;3 in 1 bedside commode    Recommendations for Other Services Speech consult    Precautions / Restrictions Precautions Precautions: Fall Precaution Comments: L hemiparesis, PRAFO, hand splint,  trach, SBP < 160, R mitten cortrak       Mobility Bed Mobility Overal bed mobility: Needs Assistance Bed Mobility: Rolling;Supine to Sit;Sit to Supine Rolling: Total assist;+2 for physical assistance;+2 for safety/equipment   Supine to sit: Total assist;+2 for physical assistance;+2 for safety/equipment Sit to supine: Total  assist;+2 for physical assistance;+2 for safety/equipment   General bed mobility comments: dependent on therapist for all aspects.    Transfers                 General transfer comment: not attempted this session    Balance Overall balance assessment: Needs assistance Sitting-balance support: Single extremity supported;Feet supported Sitting balance-Leahy Scale: Zero                                     ADL either performed or assessed with clinical judgement   ADL   Eating/Feeding: NPO                                     General ADL Comments: total (A) for all care. attempting to wipe face with wash cloth appropriately and nose with drainage     Vision   Alignment/Gaze Preference: Gaze right;Head turned;Chin down Tracking/Visual Pursuits: Requires cues, head turns, or add eye shifts to track;Unable to hold eye position out of midline;Impaired - to be further tested in functional context Additional Comments: pt with strong R gaze preference and does not come to midline at all   Perception     Praxis      Cognition Arousal/Alertness: Awake/alert Behavior During Therapy: Flat affect Overall Cognitive Status: Difficult to assess Area of Impairment: Following commands                       Following Commands: Follows one step commands consistently;Follows one step commands with increased time       General Comments: pt  nodding head yes to pain question, pt wiping mouth appropriately with coughing. pt opening eyes to name call        Exercises Exercises: Other exercises Other Exercises Other Exercises: L UE PROM digits wrist elbow shoulder- subluxation noted 1 finger space Other Exercises: R UE clonus noted with wrist extension and movement restricted by IV placement   Shoulder Instructions       General Comments L hand edema noted and retrograde massage MCP to wrist provided    Pertinent Vitals/ Pain       Pain  Assessment: Faces Faces Pain Scale: Hurts little more Pain Location: generalized Pain Descriptors / Indicators: Grimacing Pain Intervention(s): Monitored during session;Repositioned  Home Living                                          Prior Functioning/Environment              Frequency  Min 2X/week        Progress Toward Goals  OT Goals(current goals can now be found in the care plan section)  Progress towards OT goals: Progressing toward goals  Acute Rehab OT Goals Patient Stated Goal: none stated OT Goal Formulation: Patient unable to participate in goal setting Time For Goal Achievement: 06/12/21 Potential to Achieve Goals: Good ADL Goals Pt Will Perform Grooming: with min guard assist;sitting Additional ADL Goal #1: pt will static sit with mod (A) as precursor to adls Additional ADL Goal #2: pt will tolerate resting hand splints 4 hrs on and 4 hrs off Additional ADL Goal #3: pt will complete basic transfer total +2 mod (A) as precursor to adls  Plan Discharge plan remains appropriate    Co-evaluation    PT/OT/SLP Co-Evaluation/Treatment: Yes Reason for Co-Treatment: Complexity of the patient's impairments (multi-system involvement);Necessary to address cognition/behavior during functional activity;For patient/therapist safety;To address functional/ADL transfers   OT goals addressed during session: ADL's and self-care;Proper use of Adaptive equipment and DME;Strengthening/ROM      AM-PAC OT "6 Clicks" Daily Activity     Outcome Measure   Help from another person eating meals?: A Lot Help from another person taking care of personal grooming?: A Lot Help from another person toileting, which includes using toliet, bedpan, or urinal?: Total Help from another person bathing (including washing, rinsing, drying)?: Total Help from another person to put on and taking off regular upper body clothing?: Total Help from another person to put on and  taking off regular lower body clothing?: Total 6 Click Score: 8    End of Session Equipment Utilized During Treatment: Oxygen  OT Visit Diagnosis: Unsteadiness on feet (R26.81);Muscle weakness (generalized) (M62.81);Hemiplegia and hemiparesis Hemiplegia - Right/Left: Left Hemiplegia - dominant/non-dominant: Non-Dominant Hemiplegia - caused by: Cerebral infarction   Activity Tolerance Patient tolerated treatment well   Patient Left in bed;with call bell/phone within reach;with bed alarm set;with nursing/sitter in room;with SCD's reapplied   Nurse Communication Precautions;Mobility status;Need for lift equipment        Time: 1012-1039 OT Time Calculation (min): 27 min  Charges: OT General Charges $OT Visit: 1 Visit OT Treatments $Self Care/Home Management : 8-22 mins   Brynn, OTR/L  Acute Rehabilitation Services Pager: [phone] Office: [phone] .    Jeri Modena 05/29/2021, 10:53 AM

## 2021-05-29 NOTE — Progress Notes (Signed)
Physical Therapy Treatment Patient Details Name: Holly Bradley MRN: [id number] DOB: 1976-11-30 Today's Date: 05/29/2021    History of Present Illness 45 yo female s/p mechanical thrombectomy on 5/24 of right MCA M1/M2/M3 and embolization of right MCA M3 branch due to contrast extravasation. Pt re-intubated 5/26. Repeat head CT on 5/26 showed increased cytotoxic edema and mildly increased leftward midline shift, now 5 mm, decreased SAH and IVH. Abdominal CT showing 22.9 cm mass, possibly consistent with uterine fibroids, no lymphadenopathy. s/p trach 6/3. PMH fibriods HTN HF paranoid psychosis recent hospitalizations january and february 2022    PT Comments    Session focused on bed mobility and sitting edge of bed to promote upright and clear secretions. Pt with increased secretions and coughing sitting edge of bed; requiring suctioning and pt able to wipe her nose/mouth with a wash cloth. Pt demonstrates + clonus in LUE/LLE. Requiring two person total assist for functional mobility. SpO2 > 90%, BP 169/104, HR 116-129. Will continue to progress as tolerated.    Follow Up Recommendations  SNF     Equipment Recommendations  Wheelchair (measurements PT);Wheelchair cushion (measurements PT);Hospital bed;Other (comment) (hoyer lift)    Recommendations for Other Services       Precautions / Restrictions Precautions Precautions: Fall Precaution Comments: L hemiparesis, PRAFO, hand splint,  trach, SBP < 160, R mitten cortrak Restrictions Weight Bearing Restrictions: No    Mobility  Bed Mobility Overal bed mobility: Needs Assistance Bed Mobility: Rolling;Supine to Sit;Sit to Supine Rolling: Total assist;+2 for physical assistance;+2 for safety/equipment   Supine to sit: Total assist;+2 for physical assistance;+2 for safety/equipment Sit to supine: Total assist;+2 for physical assistance;+2 for safety/equipment   General bed mobility comments: dependent on therapist for all aspects.     Transfers                 General transfer comment: not attempted this session  Ambulation/Gait                 Stairs             Wheelchair Mobility    Modified Rankin (Stroke Patients Only)       Balance Overall balance assessment: Needs assistance Sitting-balance support: Single extremity supported;Feet supported Sitting balance-Leahy Scale: Zero                                      Cognition Arousal/Alertness: Awake/alert Behavior During Therapy: Flat affect Overall Cognitive Status: Difficult to assess Area of Impairment: Following commands                       Following Commands: Follows one step commands consistently;Follows one step commands with increased time       General Comments: pt nodding head yes to pain question, pt wiping mouth appropriately with coughing. pt opening eyes to name call      Exercises Other Exercises Other Exercises: L UE PROM digits wrist elbow shoulder- subluxation noted 1 finger space Other Exercises: R UE clonus noted with wrist extension and movement restricted by IV placement    General Comments General comments (skin integrity, edema, etc.): L hand edema noted and retrograde massage MCP to wrist provided      Pertinent Vitals/Pain Pain Assessment: Faces Faces Pain Scale: Hurts little more Pain Location: abdomen Pain Descriptors / Indicators: Grimacing Pain Intervention(s): Monitored during session    Home Living  Prior Function            PT Goals (current goals can now be found in the care plan section) Acute Rehab PT Goals Patient Stated Goal: none stated Potential to Achieve Goals: Fair    Frequency    Min 3X/week      PT Plan Current plan remains appropriate    Co-evaluation PT/OT/SLP Co-Evaluation/Treatment: Yes Reason for Co-Treatment: Complexity of the patient's impairments (multi-system involvement);Necessary to  address cognition/behavior during functional activity;For patient/therapist safety;To address functional/ADL transfers PT goals addressed during session: Mobility/safety with mobility OT goals addressed during session: ADL's and self-care;Proper use of Adaptive equipment and DME;Strengthening/ROM      AM-PAC PT "6 Clicks" Mobility   Outcome Measure  Help needed turning from your back to your side while in a flat bed without using bedrails?: Total Help needed moving from lying on your back to sitting on the side of a flat bed without using bedrails?: Total Help needed moving to and from a bed to a chair (including a wheelchair)?: Total Help needed standing up from a chair using your arms (e.g., wheelchair or bedside chair)?: Total Help needed to walk in hospital room?: Total Help needed climbing 3-5 steps with a railing? : Total 6 Click Score: 6    End of Session Equipment Utilized During Treatment: Oxygen Activity Tolerance: Other (comment) (limited by coughing/increased secretions) Patient left: in bed;with call bell/phone within reach;with bed alarm set;with restraints reapplied;with SCD's reapplied Nurse Communication: Mobility status PT Visit Diagnosis: Hemiplegia and hemiparesis;Other abnormalities of gait and mobility (R26.89);Unsteadiness on feet (R26.81);Muscle weakness (generalized) (M62.81);Difficulty in walking, not elsewhere classified (R26.2);Other symptoms and signs involving the nervous system (R29.898);Apraxia (R48.2) Hemiplegia - Right/Left: Left Hemiplegia - dominant/non-dominant: Non-dominant Hemiplegia - caused by: Cerebral infarction;Nontraumatic SAH;Nontraumatic intracerebral hemorrhage     Time: 1012-1039 PT Time Calculation (min) (ACUTE ONLY): 27 min  Charges:  $Therapeutic Activity: 8-22 mins                     Wyona Almas, PT, DPT Acute Rehabilitation Services Pager [phone] Office [phone]    Deno Etienne 05/29/2021, 1:12 PM

## 2021-05-29 NOTE — Progress Notes (Signed)
Nutrition Follow-up  DOCUMENTATION CODES:   Not applicable  INTERVENTION:    Tube feeding via Cortrak: Osmolite 1.5 at 50 ml/h (1200 ml per day) Prosource TF 45 ml BID  Provides 1880 kcal, 97 gm protein, 912 ml free water daily  MVI with minerals  200 ml free water every 6 hours Total free water: 1512 ml    NUTRITION DIAGNOSIS:   Inadequate oral intake related to inability to eat as evidenced by NPO status. Ongoing.   GOAL:   Patient will meet greater than or equal to 90% of their needs Met with TF.   MONITOR:   TF tolerance,Labs  REASON FOR ASSESSMENT:   Consult Enteral/tube feeding initiation and management  ASSESSMENT:   Pt with PMH of uncontrolled HTN, tobacco abuse, uterine fibroids, CHF, paroxysmal SVT, and paranoid psychosis admitted with significant encephalopathy and L sided weakness. 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   Pt discussed during ICU rounds and with RN.  Pt has been on trach collar x 48 hours Per therapy needs SNF however still has a cortrak for nutrition.  Per SLP she is not ready for PO intake.   5/25 pt failed swallow eval; cortrak placed with tip gastric 5/26 pt intubated  6/3 s/p trach placement    Medications reviewed and include: colace, megace 80 mg TID, MVI with minerals, protonix Labs reviewed    Diet Order:   Diet Order            Diet NPO time specified  Diet effective midnight                 EDUCATION NEEDS:   No education needs have been identified at this time  Skin:  Skin Assessment: Reviewed RN Assessment  Last BM:  type 7, unmeasured, has rectal tube  Height:   Ht Readings from Last 1 Encounters:  05/15/21 _0  (1.651 m)    Weight:   Wt Readings from Last 1 Encounters:  05/29/21 74 kg    Ideal Body Weight:     BMI:  Body mass index is 27.15 kg/m.  Estimated Nutritional Needs:   Kcal:  1850-2100  Protein:  90-105  grams  Fluid:  >1.8 L/day  Lockie Pares., RD, LDN, CNSC See AMiON for contact information

## 2021-05-29 NOTE — Progress Notes (Signed)
STROKE TEAM PROGRESS NOTE   INTERVAL HISTORY No family at the bedside. Pt being weaned off ventilatory support and has now been on trach collar..  Neuro stable, she is alert and following commands on the right.  She is working with physical therapists..  Vital signs are stable.  Continues to have low-grade fever.  White count is down to 17.1 today.    Vitals:   05/29/21 1300 05/29/21 1400 05/29/21 1503 05/29/21 1600  BP: (!) 147/94 138/73 (!) 161/81 (!) 170/86  Pulse: (!) 102 (!) 102  (!) 113  Resp: 20 (!) 21 (!) 23 20  Temp:      TempSrc:      SpO2: 95% 92% 97% 93%  Weight:      Height:       CBC:  Recent Labs  Lab 05/28/21 0430 05/29/21 0254  WBC 19.4* 17.1*  HGB 10.0* 8.8*  HCT 35.2* 30.9*  MCV 77.2* 75.9*  PLT 427* 326*   Basic Metabolic Panel:  Recent Labs  Lab 05/28/21 0430 05/29/21 0254  NA 140 137  K 5.3* 4.4  CL 109 108  CO2 21* 21*  GLUCOSE 101* 112*  BUN 26* 25*  CREATININE 1.30* 1.18*  CALCIUM 9.1 9.3   Lipid Panel:  Recent Labs  Lab 05/24/21 0447  TRIG 81   HgbA1c:  No results for input(s): HGBA1C in the last 168 hours. Urine Drug Screen:  No results for input(s): LABOPIA, COCAINSCRNUR, LABBENZ, AMPHETMU, THCU, LABBARB in the last 168 hours.  Alcohol Level  No results for input(s): ETH in the last 168 hours.  IMAGING  CT HEAD WO CONTRAST Result Date: 05/16/2021  IMPRESSION:  1. Continued interval evolution of moderately large right MCA distribution infarct. Associated regional mass effect has mildly increased, with new 2 mm of right-to-left midline shift. No hydrocephalus or trapping.  2. Decreased prominence of subarachnoid hemorrhage at the posterior right Sylvian fissure and overlying right frontoparietal region. Trace intraventricular hemorrhage related to redistribution.  3. Question new area of parenchymal hypodensity involving the lateral left cerebellum. While this finding is favored to be artifactual, a possible new acute ischemic  infarct is difficult to exclude, and could be considered in the correct clinical setting. Attention at follow-up recommended.  4. No other new acute intracranial abnormality.   CT HEAD WO CONTRAST Result Date: 05/15/2021  IMPRESSION: Small-to-moderate volume hyperdensity within the right sylvian fissure and along portions of the right frontoparietal and temporal lobes compatible with extravasated contrast/subarachnoid hemorrhage. There is hyperdensity within the right basal ganglia, right insula, lateral right frontal lobe/frontal operculum and within portions of the right parietal lobe likely reflecting contrast staining at sites of acute infarction. Mild associated mass effect with subtle partial effacement of the right lateral ventricle. No midline shift or hydrocephalus. Embolization material now overlies the lateral right parietal lobe. Stable background parenchymal atrophy and chronic small vessel ischemic disease. Redemonstrated chronic infarct within the superior left cerebellum.   MR BRAIN WO CONTRAST Result Date: 05/15/2021 IMPRESSION:  1. Moderately large acute right MCA infarct. Associated petechial hemorrhage, subarachnoid hemorrhage, and minimal intraventricular hemorrhage.  2. Severe chronic small vessel ischemic disease with multiple chronic infarcts.   MR CERVICAL SPINE WO CONTRAST Result Date: 05/15/2021  IMPRESSION: The cord itself appears normal. No disc disease. No spinal stenosis. 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 No significant encroachment upon the neural spaces. Some prevertebral soft tissue edema. In this clinical scenario, this may be subsequent to previous endotracheal intubation.  One could not rule out pharyngitis or cellulitis but that seems less likely. I do not see any traumatic injury to the cervical spine itself. Electronically Signed   By: Nelson Chimes M.D.   On: 05/15/2021 16:05   IR CT Head Ltd Result Date: 05/15/2021 CT of the head was  obtained and post processed in a separate workstation with concurrent attending physician supervision. Selected images were sent to PACS. Small right sylvian contrast extravasation confirmed. Additionally, hyperdensity of the right basal ganglia and lateral right frontal cortex are noted, related to ongoing ischemia. Right internal carotid artery angiograms with frontal and lateral views of the head showed occlusion of the embolized branch with no evidence of contrast extravasation. Patency of the remaining MCA branches noted.  IR PERCUTANEOUS ART THROMBECTOMY/INFUSION INTRACRANIAL INC DIAG ANGIO Result Date: 05/15/2021 IMPRESSION: Mechanical thrombectomy performed for treatment of right M1/MCA occlusion with clot fragmentation and embolization to same distal territory requiring multiple additional passes. Procedure complicated by contrast extravasation at a distal right M3/MCA branch requiring vessel embolization. Near complete recanalization of the right MCA vascular tree was achieved (TICI 2b). PLAN: - Bed rest post femoral access x6h - Follow-up CT in 4 hours to evaluate for SAH/contrast extravasation stability - Transfer to ICU- SBP: 120-140 mmHg   Echo Result read: 05/16/2021 1. Left ventricular ejection fraction, by estimation, is 55 to 60%. The  left ventricle has normal function. The left ventricle has no regional  wall motion abnormalities. There is moderate concentric left ventricular  hypertrophy. Left ventricular  diastolic parameters are consistent with Grade II diastolic dysfunction  (pseudonormalization). Elevated left atrial pressure.  2. Right ventricular systolic function is normal. The right ventricular  size is normal. Mildly increased right ventricular wall thickness. There  is moderately elevated pulmonary artery systolic pressure. The estimated  right ventricular systolic pressure  is 99.3 mmHg.  3. Left atrial size was severely dilated.  4. Right atrial size was  severely dilated.  5. There is no evidence of cardiac tamponade.  6. The mitral valve is normal in structure. Trivial mitral valve  regurgitation. No evidence of mitral stenosis.  7. The aortic valve is normal in structure. There is mild calcification  of the aortic valve. There is mild thickening of the aortic valve. Aortic  valve regurgitation is not visualized. Mild aortic valve sclerosis is  present, with no evidence of aortic  valve stenosis.  8. The inferior vena cava is dilated in size with <50% respiratory  variability, suggesting right atrial pressure of 15 mmHg.   PHYSICAL EXAM  Temp:  [98.9 F (37.2 C)-99.7 F (37.6 C)] 99.7 F (37.6 C) (06/07 0804) Pulse Rate:  [97-118] 113 (06/07 1600) Resp:  [17-48] 20 (06/07 1600) BP: (123-170)/(60-125) 170/86 (06/07 1600) SpO2:  [92 %-97 %] 93 % (06/07 1600) FiO2 (%):  [21 %] 21 % (06/07 1503) Weight:  [74 kg] 74 kg (06/07 0450)  General - Well nourished, well developed middle-aged African-American lady, s/p trach.  Ophthalmologic - fundi not visualized due to noncooperation.  Cardiovascular - Regular rhythm and rate  Neuro -   s/p trach on trach collar., eyes closed, will partially open with voice stimulation,   able to follow commands on the right hand and foot occasionally.. With forced eye opening, eyes in right forced gaze preference, not blinking to visual threat on the left, not tracking, PERRL. B/l eyelids mildly swollen. Corneal reflex present, gag and cough present.  Mild left lower facial asymmetry.  Tongue protrusion not cooperative.  On pain stimulation, left UE and LE flaccid. Right UE proximal 1/5 and distal finger movement 2+/5 and RLE rigorously movement in bed with stimulation. No babinski. Sensation, coordination not cooperative and gait not tested.   ASSESSMENT/PLAN [name] is a 45 y.o. female with history of uncontrolled HTN, tobacco use disorder, large uterine fibroids, heart failure, paroxysmal  SVT, paranoid psychosis who presents with altered mental status after being found down.  CT head initially read as small acute appearing perforator infarct in the R BG and notable for severe small vessel ischemia. However concerning for potential R MCA stroke and thus CTA head and neck and CTP was obtained with a R MCA M1 occlusion and a core of 50cc and 63ml of penumbra.   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   CTA neck: LVO right M1 origin. Left vertebral occlusion at its origin. High-grade narrowings of the right cavernous ICA.  CT perfusion suggestive 50 cc core infarct and 50 cc of adjacent penumbra.  MRI: large acute right MCA infarct. Associated petechial hemorrhage, subarachnoid hemorrhage, and minimal intraventricular hemorrhage.  CT 5/28 stable right MCA infarct with MLS  CT 5/30 stable right MCA infarct and MLS 61mm  2D Echo 55 to 60%  Further cardioembolic work up once pt neuro improves  Hypercoagulable panel pending.  Homocystine is normal.    UDS: negative  LDL 76  HgbA1c 5.4  UDS neg  Sickle cell screen - neg, ANA neg  VTE prophylaxis - SCDs  No antithrombotic prior to admission, now on ASA 81.  Therapy recommendations:  CIR  Disposition:  Pending  Cytotoxic cerebral edema  On 3% saline @ 30 -> FW 100 Q4. Stopped. Now on free water through [employer]  Na 157->156->157->155->157->153->150->150 >147  CT head 5/30 stable right MCA infarcts and MLS 25mm  OK to gradually normalize Na level  Respiratory failure  Intubated 5/26 for increased work of breathing with altered mental status   Management per CCM, appreciated  Switched to precedex -> now off   Not candidate for extubation  S/p trach 6/3 with CCM, Trach collar today  Acute blood loss anemia Large  uterine fibroids  hgb 7.8->5.9->PRBC->7.6->6.5->PRBC->7.2->6.7->PRBC->8.5->7.7->7.3->7.6 >7.5>9/1  PRBC tranfusion x 3  extensive virginal bleeding from extremely large abdominopelvic mass measuring up to 22.9 cm noted on CT 5/26  Has been followed with Dr. Jonna Clark - poor surgical candidate due to uncontrolled HTN and SVT  Abdominal distention on exam  Increase Megace to megace 80mg  tid  OBGYN Dr. Rosana Hoes consulted, appreciate help - not surgical candidate  Dysphagia  Likely due to stroke  SLP following  cortrak placement  On TF @ 50 and FW  Paroxysmal SVT history Hx of CHF  Followed with cardiology   EF 40-45% in 04/2020 and 20-25% in 12/2020  This admission EF 55-60%  on amiodarone 200mg  daily and isosorbide 20 tid  Currently heart rate controlled  Consider TEE/Loop recorder prior to discahrge if able to tolerate  Hypertension  Home meds:  Hydralazine 100mg  TID, Imdur 60mg  daily, Toprol XL 200mg  daily  Off Cleviprex now  Amlodipine 10mg  daily -> 5mg  bid  clonidine 0.2 tid -> 0.1 Q8h  Hydralazine 100mg  tid    imdur 20mg  tid -> Q8h  Metoprolol 100mg  bid  SBP goal to <160 mmHg  Labetalol  prn . Long-term BP goal normotensive  Hyperlipidemia  Home meds:  none  LDL 76, goal < 70  Atorvastatin 40mg  daily  Continue statin at discharge  Tobacco abuse  Current smoker  Smoking cessation counseling will be provided  Fever and Leukocytosis  WBC 12.7->14.0->23.1->20.9->20.6->19.1->16.8->15.4->18.1 >15.5>17.7   Afebrile for the past 24 h  Sputum culture - staph Aures   Blood culture G+ cocci in clusters  CXR LLL atx vs. pneumonia  CCM on board  On ancef   Other Stroke Risk Factors  Coronary artery disease  Other Active Problems  AKI: creatinine 1.27->1.48->1.54->1.51->1.52->1.75->1.39->1.43->1.45 >1.5>1.43  Lethargic - added provigil by CCM   hospital day #14 Patient had tracheostomy and has now been successfully weaned to  trach collar    Speech therapy feel she will likely need PEG tube.  No family available at the bedside for discussion.  Discussed with Dr. Tacy Learn critical care medicine and speech therapist.  Recommend transfer to medical hospitalist service after discharge to ICU.  Transfer to neurology floor bed when bed available This patient is critically ill due to respiratory failure s/p trach, right MCA infarct with hemorrhagic conversion, bacteremia, severe anemia, leukocytosis and AKI and at significant risk of neurological worsening, death form recurrent stroke, sepsis, shock with severe anemia, cerebral edema. This patient's care requires constant monitoring of vital signs, hemodynamics, respiratory and cardiac monitoring, review of multiple databases, neurological assessment, discussion with  other specialists and medical decision making of high complexity.  I spent 32 minutes of neuro critical care time in the care of this patient.   Antony Contras, MD Stroke Neurology 05/29/2021 5:27 PM     To contact Stroke Continuity provider, please refer to [email or web address] After hours, contact General Neurology

## 2021-05-29 NOTE — Progress Notes (Signed)
eLink Physician-Brief Progress Note [name] DOB: [birth year] MRN: [id number]   Date of Service  05/29/2021  HPI/Events of Note  SBP > 160 to 170. HR > 100.  On home meds, but not on beta blocker. On ARB/ACE, norvasc, hydralazine.  Goal to keep < 160.   eICU Interventions  Labetalol 10 mg  IV once prn   discussed with bed side RN.      Intervention Category Intermediate Interventions: Hypertension - evaluation and management  Elmer Sow 05/29/2021, 3:34 AM

## 2021-05-30 ENCOUNTER — Inpatient Hospital Stay (HOSPITAL_COMMUNITY): Payer: Medicaid Other

## 2021-05-30 DIAGNOSIS — A419 Sepsis, unspecified organism: Secondary | ICD-10-CM

## 2021-05-30 LAB — CBC
HCT: 27.4 % — ABNORMAL LOW (ref 36.0–46.0)
Hemoglobin: 7.9 g/dL — ABNORMAL LOW (ref 12.0–15.0)
MCH: 22 pg — ABNORMAL LOW (ref 26.0–34.0)
MCHC: 28.8 g/dL — ABNORMAL LOW (ref 30.0–36.0)
MCV: 76.3 fL — ABNORMAL LOW (ref 80.0–100.0)
Platelets: 501 10*3/uL — ABNORMAL HIGH (ref 150–400)
RBC: 3.59 MIL/uL — ABNORMAL LOW (ref 3.87–5.11)
RDW: 28.5 % — ABNORMAL HIGH (ref 11.5–15.5)
WBC: 13.2 10*3/uL — ABNORMAL HIGH (ref 4.0–10.5)
nRBC: 0 % (ref 0.0–0.2)

## 2021-05-30 LAB — BASIC METABOLIC PANEL
Anion gap: 6 (ref 5–15)
BUN: 20 mg/dL (ref 6–20)
CO2: 22 mmol/L (ref 22–32)
Calcium: 9 mg/dL (ref 8.9–10.3)
Chloride: 111 mmol/L (ref 98–111)
Creatinine, Ser: 1.17 mg/dL — ABNORMAL HIGH (ref 0.44–1.00)
GFR, Estimated: 58 mL/min — ABNORMAL LOW (ref >60)
Glucose, Bld: 118 mg/dL — ABNORMAL HIGH (ref 70–99)
Potassium: 4.7 mmol/L (ref 3.5–5.1)
Sodium: 139 mmol/L (ref 135–145)

## 2021-05-30 LAB — GLUCOSE, CAPILLARY
Glucose-Capillary: 108 mg/dL — ABNORMAL HIGH (ref 70–99)
Glucose-Capillary: 110 mg/dL — ABNORMAL HIGH (ref 70–99)
Glucose-Capillary: 111 mg/dL — ABNORMAL HIGH (ref 70–99)
Glucose-Capillary: 112 mg/dL — ABNORMAL HIGH (ref 70–99)
Glucose-Capillary: 113 mg/dL — ABNORMAL HIGH (ref 70–99)
Glucose-Capillary: 116 mg/dL — ABNORMAL HIGH (ref 70–99)
Glucose-Capillary: 118 mg/dL — ABNORMAL HIGH (ref 70–99)

## 2021-05-30 LAB — CULTURE, RESPIRATORY W GRAM STAIN: Culture: NORMAL

## 2021-05-30 IMAGING — DX DG CHEST 1V PORT
1 series · 1 of 1 positions shown · non-contrast
Comparison: [DATE].

CLINICAL DATA: Fever.

EXAM:
PORTABLE CHEST 1 VIEW

[chest ap]
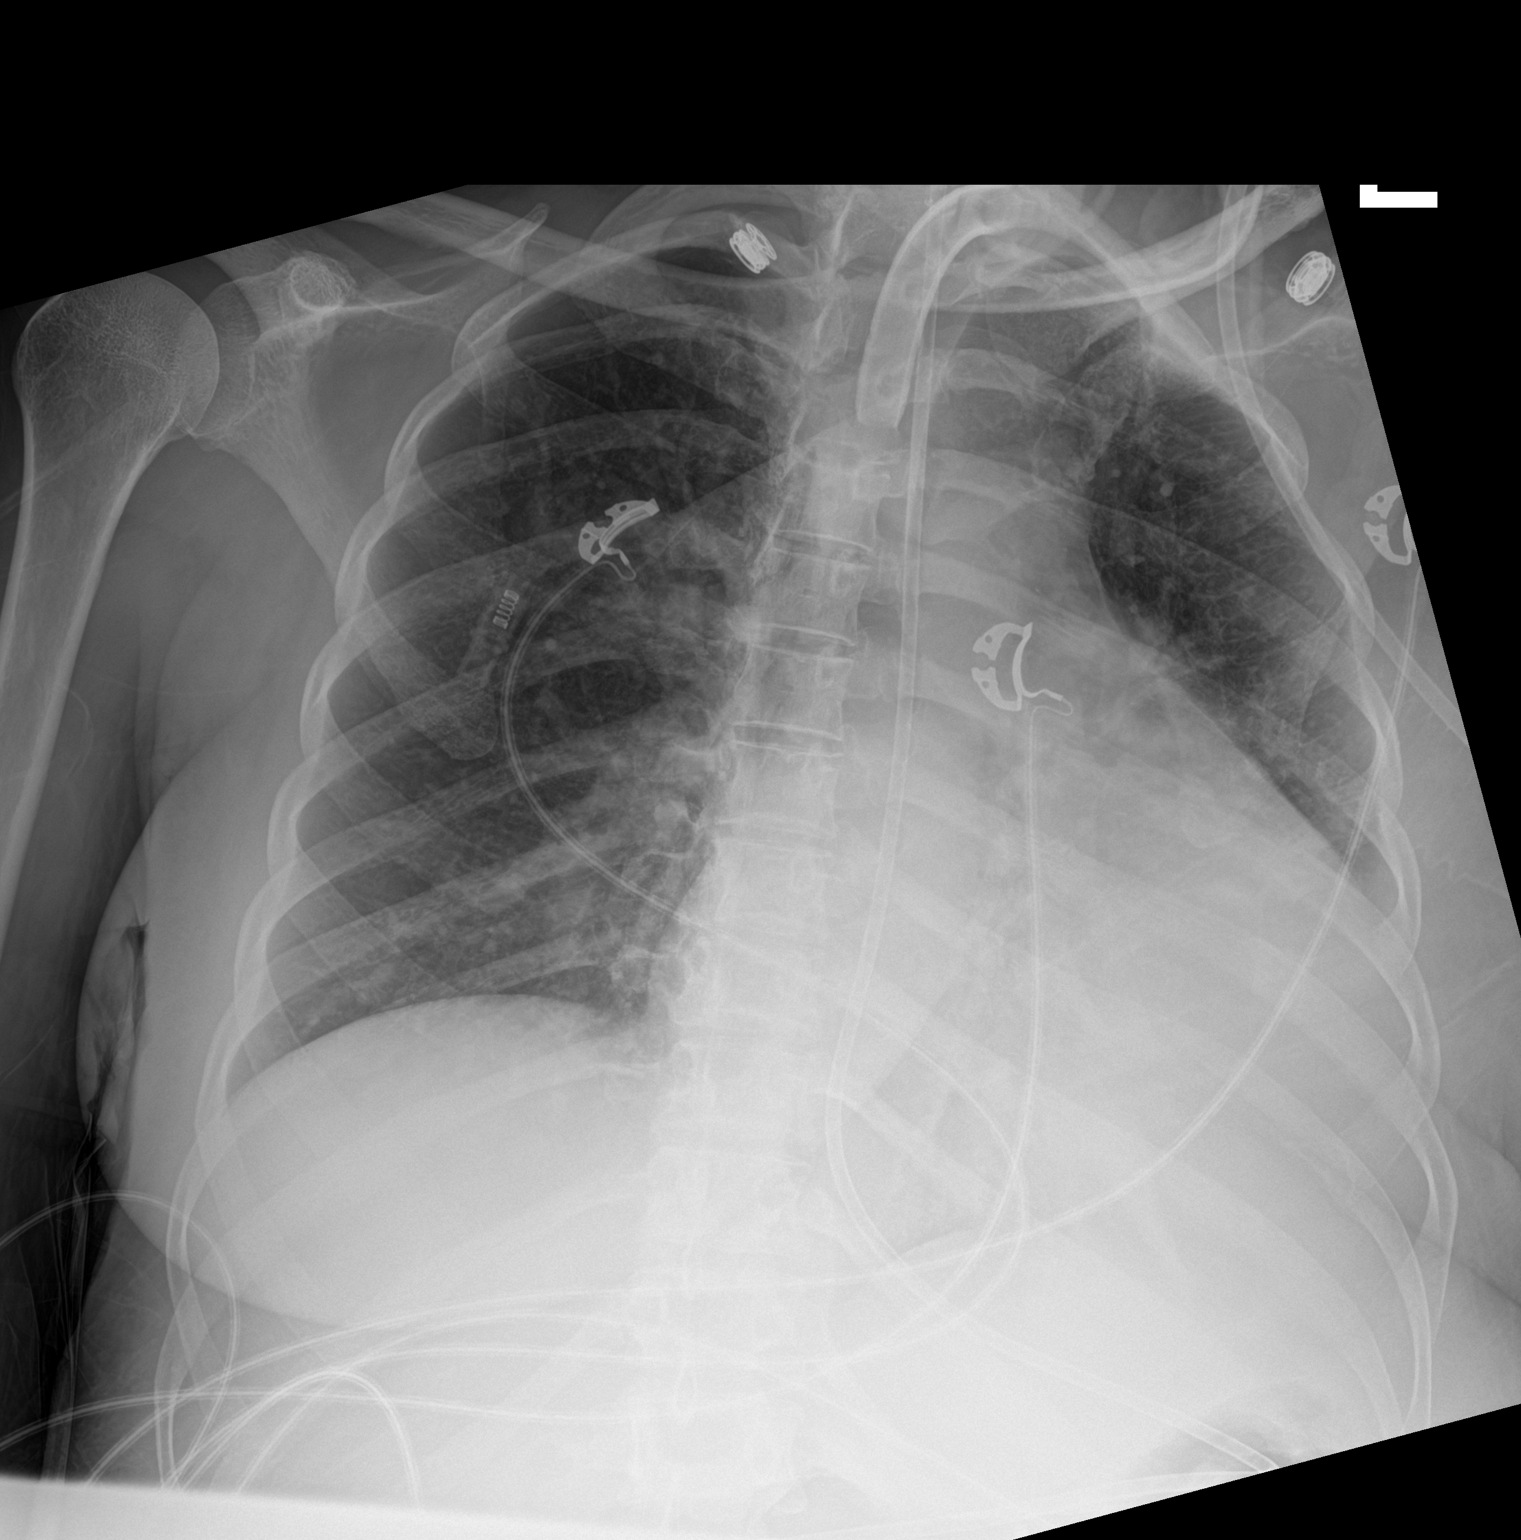

[1 of 1 positions shown; findings below may reference images not displayed]

FINDINGS: Tracheostomy tube and feeding tube in stable position. Cardiomegaly.
No pulmonary venous congestion. Left lower lobe
atelectasis/consolidation again noted. Small left pleural effusion.
No pneumothorax.
IMPRESSION: 1.  Tracheostomy tube and feeding tube in stable position.

2.  Cardiomegaly.  No pulmonary venous congestion.

3. Left lower lobe atelectasis/consolidation again noted. Small left
pleural effusion.

## 2021-05-30 IMAGING — DX DG ABD PORTABLE 1V
1 series · 1 of 1 positions shown · non-contrast
Comparison: [DATE].

CLINICAL DATA: Ileus.

EXAM:
PORTABLE ABDOMEN - 1 VIEW

[abdomen kub]
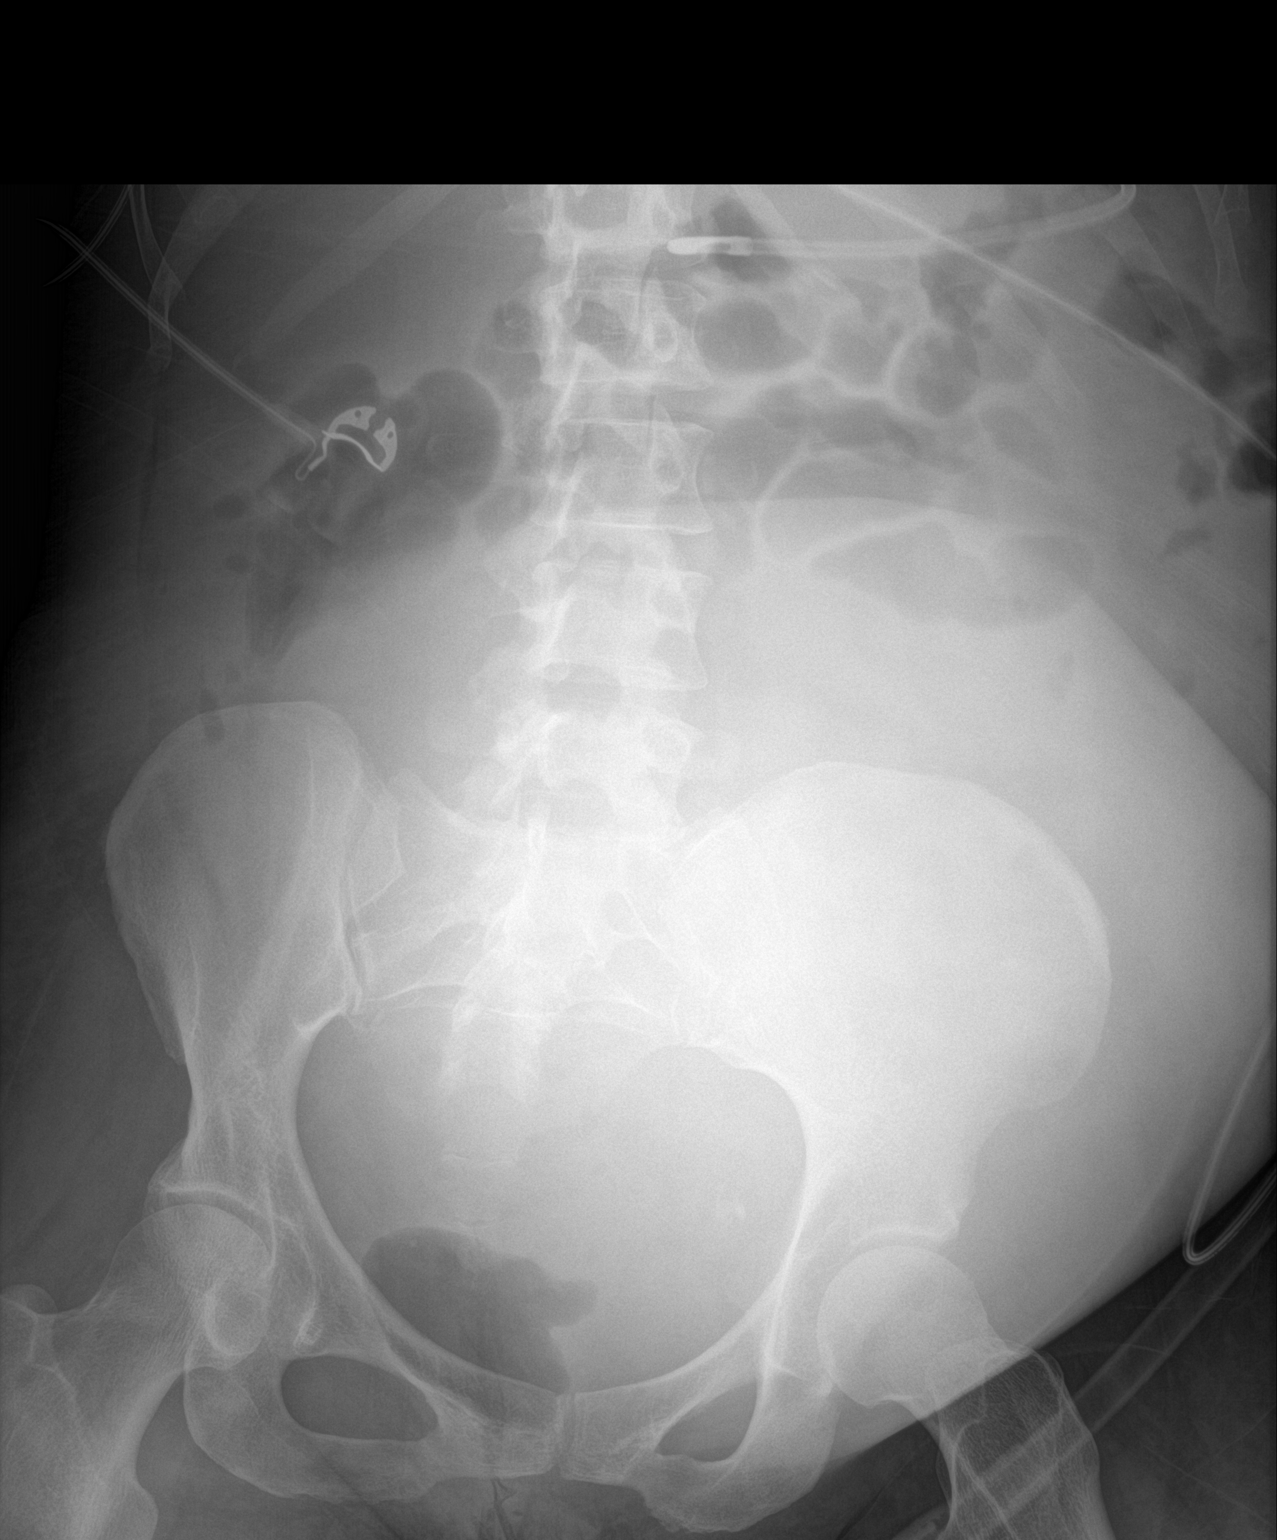

[1 of 1 positions shown; findings below may reference images not displayed]

FINDINGS: Feeding tube noted with tip over the stomach. No bowel distention.
No free air. Large abdominopelvic mass again noted. Reference is
made to CT report of [DATE].
IMPRESSION: 1. Feeding tube noted with tip over the stomach. No bowel
distention.

2. Large abdominopelvic mass again noted. Reference is made to CT
report of [DATE].

## 2021-05-30 MED ORDER — WHITE PETROLATUM EX OINT
TOPICAL_OINTMENT | CUTANEOUS | Status: AC
  Administered 2021-05-30: 0.2
  Filled 2021-05-30: qty 28.35

## 2021-05-30 MED ORDER — AMLODIPINE BESYLATE 10 MG PO TABS
10.0000 mg | ORAL_TABLET | Freq: Every day | ORAL | Status: DC
  Administered 2021-05-30 – 2021-06-21 (×23): 10 mg
  Filled 2021-05-30 (×16): qty 1
  Filled 2021-05-30: qty 2
  Filled 2021-05-30 (×6): qty 1

## 2021-05-30 MED ORDER — CEFAZOLIN SODIUM-DEXTROSE 2-4 GM/100ML-% IV SOLN
2.0000 g | Freq: Three times a day (TID) | INTRAVENOUS | Status: DC
  Administered 2021-05-30 – 2021-05-31 (×3): 2 g via INTRAVENOUS
  Filled 2021-05-30 (×5): qty 100

## 2021-05-30 NOTE — Progress Notes (Signed)
STROKE TEAM PROGRESS NOTE   INTERVAL HISTORY No family at the bedside. Pt was transferred to the floor yesterday and seems to be breathing well ontrach collar..  Neuro stable, she is alert and following commands on the right.   .  Vital signs are stable.  Continues to have low-grade fever.  White count is down to 13.2 today.    Vitals:   05/30/21 0756 05/30/21 0922 05/30/21 1155 05/30/21 1206  BP: (!) 163/83   (!) 148/78  Pulse: (!) 106 100 (!) 106   Resp:  (!) 24 (!) 26   Temp:    99.3 F (37.4 C)  TempSrc:    Axillary  SpO2:  96% 97%   Weight:      Height:       CBC:  Recent Labs  Lab 05/29/21 0254 05/30/21 1041  WBC 17.1* 13.2*  HGB 8.8* 7.9*  HCT 30.9* 27.4*  MCV 75.9* 76.3*  PLT 460* 629*   Basic Metabolic Panel:  Recent Labs  Lab 05/29/21 0254 05/30/21 1041  NA 137 139  K 4.4 4.7  CL 108 111  CO2 21* 22  GLUCOSE 112* 118*  BUN 25* 20  CREATININE 1.18* 1.17*  CALCIUM 9.3 9.0   Lipid Panel:  Recent Labs  Lab 05/24/21 0447  TRIG 81   HgbA1c:  No results for input(s): HGBA1C in the last 168 hours. Urine Drug Screen:  No results for input(s): LABOPIA, COCAINSCRNUR, LABBENZ, AMPHETMU, THCU, LABBARB in the last 168 hours.  Alcohol Level  No results for input(s): ETH in the last 168 hours.  IMAGING  CT HEAD WO CONTRAST Result Date: 05/16/2021  IMPRESSION:  1. Continued interval evolution of moderately large right MCA distribution infarct. Associated regional mass effect has mildly increased, with new 2 mm of right-to-left midline shift. No hydrocephalus or trapping.  2. Decreased prominence of subarachnoid hemorrhage at the posterior right Sylvian fissure and overlying right frontoparietal region. Trace intraventricular hemorrhage related to redistribution.  3. Question new area of parenchymal hypodensity involving the lateral left cerebellum. While this finding is favored to be artifactual, a possible new acute ischemic infarct is difficult to exclude, and  could be considered in the correct clinical setting. Attention at follow-up recommended.  4. No other new acute intracranial abnormality.   CT HEAD WO CONTRAST Result Date: 05/15/2021  IMPRESSION: Small-to-moderate volume hyperdensity within the right sylvian fissure and along portions of the right frontoparietal and temporal lobes compatible with extravasated contrast/subarachnoid hemorrhage. There is hyperdensity within the right basal ganglia, right insula, lateral right frontal lobe/frontal operculum and within portions of the right parietal lobe likely reflecting contrast staining at sites of acute infarction. Mild associated mass effect with subtle partial effacement of the right lateral ventricle. No midline shift or hydrocephalus. Embolization material now overlies the lateral right parietal lobe. Stable background parenchymal atrophy and chronic small vessel ischemic disease. Redemonstrated chronic infarct within the superior left cerebellum.   MR BRAIN WO CONTRAST Result Date: 05/15/2021 IMPRESSION:  1. Moderately large acute right MCA infarct. Associated petechial hemorrhage, subarachnoid hemorrhage, and minimal intraventricular hemorrhage.  2. Severe chronic small vessel ischemic disease with multiple chronic infarcts.   MR CERVICAL SPINE WO CONTRAST Result Date: 05/15/2021  IMPRESSION: The cord itself appears normal. No disc disease. No spinal stenosis. 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 No significant encroachment upon the neural spaces. Some prevertebral soft tissue edema. In this clinical scenario, this may be subsequent to previous endotracheal intubation. One  could not rule out pharyngitis or cellulitis but that seems less likely. I do not see any traumatic injury to the cervical spine itself. Electronically Signed   By: Nelson Chimes M.D.   On: 05/15/2021 16:05   IR CT Head Ltd Result Date: 05/15/2021 CT of the head was obtained and post processed in a separate  workstation with concurrent attending physician supervision. Selected images were sent to PACS. Small right sylvian contrast extravasation confirmed. Additionally, hyperdensity of the right basal ganglia and lateral right frontal cortex are noted, related to ongoing ischemia. Right internal carotid artery angiograms with frontal and lateral views of the head showed occlusion of the embolized branch with no evidence of contrast extravasation. Patency of the remaining MCA branches noted.  IR PERCUTANEOUS ART THROMBECTOMY/INFUSION INTRACRANIAL INC DIAG ANGIO Result Date: 05/15/2021 IMPRESSION: Mechanical thrombectomy performed for treatment of right M1/MCA occlusion with clot fragmentation and embolization to same distal territory requiring multiple additional passes. Procedure complicated by contrast extravasation at a distal right M3/MCA branch requiring vessel embolization. Near complete recanalization of the right MCA vascular tree was achieved (TICI 2b). PLAN: - Bed rest post femoral access x6h - Follow-up CT in 4 hours to evaluate for SAH/contrast extravasation stability - Transfer to ICU- SBP: 120-140 mmHg   Echo Result read: 05/16/2021 1. Left ventricular ejection fraction, by estimation, is 55 to 60%. The  left ventricle has normal function. The left ventricle has no regional  wall motion abnormalities. There is moderate concentric left ventricular  hypertrophy. Left ventricular  diastolic parameters are consistent with Grade II diastolic dysfunction  (pseudonormalization). Elevated left atrial pressure.  2. Right ventricular systolic function is normal. The right ventricular  size is normal. Mildly increased right ventricular wall thickness. There  is moderately elevated pulmonary artery systolic pressure. The estimated  right ventricular systolic pressure  is 89.2 mmHg.  3. Left atrial size was severely dilated.  4. Right atrial size was severely dilated.  5. There is no evidence of  cardiac tamponade.  6. The mitral valve is normal in structure. Trivial mitral valve  regurgitation. No evidence of mitral stenosis.  7. The aortic valve is normal in structure. There is mild calcification  of the aortic valve. There is mild thickening of the aortic valve. Aortic  valve regurgitation is not visualized. Mild aortic valve sclerosis is  present, with no evidence of aortic  valve stenosis.  8. The inferior vena cava is dilated in size with <50% respiratory  variability, suggesting right atrial pressure of 15 mmHg.   PHYSICAL EXAM  Temp:  [99 F (37.2 C)-99.8 F (37.7 C)] 99.3 F (37.4 C) (06/08 1206) Pulse Rate:  [98-116] 106 (06/08 1155) Resp:  [17-32] 26 (06/08 1155) BP: (132-170)/(60-94) 148/78 (06/08 1206) SpO2:  [92 %-98 %] 97 % (06/08 1155) FiO2 (%):  [21 %] 21 % (06/08 1155) Weight:  [74 kg] 74 kg (06/08 0322)  General - Well nourished, well developed middle-aged African-American lady, s/p trach.  On trach collar  Ophthalmologic - fundi not visualized due to noncooperation.  Cardiovascular - Regular rhythm and rate  Neuro -   s/p trach on trach collar., eyes open able to follow commands on the right hand and foot occasionally.. With forced eye opening, eyes in right forced gaze preference, not blinking to visual threat on the left, not tracking, PERRL. B/l eyelids mildly swollen. Corneal reflex present, gag and cough present.  Mild left lower facial asymmetry.  Tongue protrusion not cooperative. On pain stimulation, left UE  and LE flaccid. Right UE proximal 2/5 and distal finger movement 2+/5 and RLE rigorously movement in bed with stimulation. No babinski. Sensation, coordination not cooperative and gait not tested.   ASSESSMENT/PLAN [name] is a 45 y.o. female with history of uncontrolled HTN, tobacco use disorder, large uterine fibroids, heart failure, paroxysmal SVT, paranoid psychosis who presents with altered mental status after being found  down.  CT head initially read as small acute appearing perforator infarct in the R BG and notable for severe small vessel ischemia. However concerning for potential R MCA stroke and thus CTA head and neck and CTP was obtained with a R MCA M1 occlusion and a core of 50cc and 69ml of penumbra.   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   CTA neck: LVO right M1 origin. Left vertebral occlusion at its origin. High-grade narrowings of the right cavernous ICA.  CT perfusion suggestive 50 cc core infarct and 50 cc of adjacent penumbra.  MRI: large acute right MCA infarct. Associated petechial hemorrhage, subarachnoid hemorrhage, and minimal intraventricular hemorrhage.  CT 5/28 stable right MCA infarct with MLS  CT 5/30 stable right MCA infarct and MLS 39mm  2D Echo 55 to 60%  Further cardioembolic work up once pt neuro improves  Hypercoagulable panel pending.  Homocystine is normal.    UDS: negative  LDL 76  HgbA1c 5.4  UDS neg  Sickle cell screen - neg, ANA neg  VTE prophylaxis - SCDs  No antithrombotic prior to admission, now on ASA 81.  Therapy recommendations:  CIR  Disposition:  Pending  Cytotoxic cerebral edema  On 3% saline @ 30 -> FW 100 Q4. Stopped. Now on free water through [facility]  Na 157->156->157->155->157->153->150->150 >147  CT head 5/30 stable right MCA infarcts and MLS 69mm  OK to gradually normalize Na level  Respiratory failure  Intubated 5/26 for increased work of breathing with altered mental status   Management per CCM, appreciated  Switched to precedex -> now off   Not candidate for extubation  S/p trach 6/3 with CCM, Trach collar today  Acute blood loss anemia Large uterine fibroids  hgb  7.8->5.9->PRBC->7.6->6.5->PRBC->7.2->6.7->PRBC->8.5->7.7->7.3->7.6 >7.5>9/1  PRBC tranfusion x 3  extensive virginal bleeding from extremely large abdominopelvic mass measuring up to 22.9 cm noted on CT 5/26  Has been followed with Dr. Jonna Clark - poor surgical candidate due to uncontrolled HTN and SVT  Abdominal distention on exam  Increase Megace to megace 80mg  tid  OBGYN Dr. Rosana Hoes consulted, appreciate help - not surgical candidate  Dysphagia  Likely due to stroke  SLP following  cortrak placement  On TF @ 50 and FW  Paroxysmal SVT history Hx of CHF  Followed with cardiology   EF 40-45% in 04/2020 and 20-25% in 12/2020  This admission EF 55-60%  on amiodarone 200mg  daily and isosorbide 20 tid  Currently heart rate controlled  Consider TEE/Loop recorder prior to discahrge if able to tolerate  Hypertension  Home meds:  Hydralazine 100mg  TID, Imdur 60mg  daily, Toprol XL 200mg  daily  Off Cleviprex now  Amlodipine 10mg  daily -> 5mg  bid  clonidine 0.2 tid -> 0.1 Q8h  Hydralazine 100mg  tid    imdur 20mg  tid -> Q8h  Metoprolol 100mg  bid  SBP goal to <160 mmHg  Labetalol prn . Long-term BP goal  normotensive  Hyperlipidemia  Home meds:  none  LDL 76, goal < 70  Atorvastatin 40mg  daily  Continue statin at discharge  Tobacco abuse  Current smoker  Smoking cessation counseling will be provided  Fever and Leukocytosis  WBC 12.7->14.0->23.1->20.9->20.6->19.1->16.8->15.4->18.1 >15.5>17.7   Afebrile for the past 24 h  Sputum culture - staph Aures   Blood culture G+ cocci in clusters  CXR LLL atx vs. pneumonia  CCM on board  On ancef   Other Stroke Risk Factors  Coronary artery disease  Other Active Problems  AKI: creatinine 1.27->1.48->1.54->1.51->1.52->1.75->1.39->1.43->1.45 >1.5>1.43  Lethargic - added provigil by CCM-DC on 05/30/2021 as patient is awake and blood pressure was elevated   hospital day #15 Patient seems to be  breathing well on the trach collar.  Her white count seems to be coming down on antibiotics.  She is still unable to swallow and needs his PEG tube as per speech therapist..  Discussed with Dr. Grandville Silos trauma team consulted for PEG tub.  Discussed with Dr. Wynelle Cleveland.  Discontinue Provigil as she is awake and is now.  Greater than 50% time during this 35-minute visit was spent in counseling and coordination of care stroke and dysphagia and respiratory failure and answering questions. Antony Contras, MD Stroke Neurology 05/30/2021 12:55 PM     To contact Stroke Continuity provider, please refer to [email or web address] After hours, contact General Neurology

## 2021-05-30 NOTE — Consult Note (Signed)
Consult Note  Holly Bradley [birth year]  [id number].    Requesting MD: Dr. Philis Pique Chief Complaint/Reason for Consult: PEG consult  HPI:  Patient is a 45 year old female who is currently admitted s/p R MCA CVA 05/15/21. She underwent successful mechanical thrombectomy which was complicated by contrast extravasation and M3 coil embolization and revascularization, found to have subsequent SAH at the R sylvan fissure.  On follow up imaging, she had stable appearing SAH and continued R basal ganglia infarct with 84mm R to L shift.   She was admitted to the Neuro ICU and treating with hypertonic saline, statin and BP control. 5/26 patient developed acute respiratory failure requiring intubation. Repeat head CT on 5/26 showed increased cytotoxic edema and mildly increased leftward midline shift, now 5 mm, decreased SAH and IVH.  Abdominal CT showing 22.9 cm mass, possibly consistent with uterine fibroids, no lymphadenopathy. Hospital course has also been complicated by PNA which she has completed treatment for. Still has a leukocytosis of 17K with blood cultures pending. Speech has been following and patient currently has a #6 cuffed trach in place. Patient appears to be tolerating TF but unclear what bowel function has been. Trauma consulted for possible PEG placement for dysphagia.  ROS:  Review of Systems  Unable to perform ROS: Patient nonverbal    History reviewed. No pertinent family history.  Past Medical History:  Diagnosis Date  . Hypertension     Past Surgical History:  Procedure Laterality Date  . IR ANGIOGRAM FOLLOW UP STUDY  05/15/2021  . IR CT HEAD LTD  05/15/2021  . IR PERCUTANEOUS ART THROMBECTOMY/INFUSION INTRACRANIAL INC DIAG ANGIO  05/15/2021      . IR PERCUTANEOUS ART THROMBECTOMY/INFUSION INTRACRANIAL INC DIAG ANGIO  05/15/2021  . IR TRANSCATH/EMBOLIZ  05/15/2021  . RADIOLOGY WITH ANESTHESIA N/A 05/15/2021   Procedure: IR WITH ANESTHESIA;  Surgeon: Radiologist,  Medication, MD;  Location: [facility];  Service: Radiology;  Laterality: N/A;    Social History:  reports that she has been smoking. She has never used smokeless tobacco. No history on file for alcohol use and drug use.  Allergies: No Known Allergies  Medications Prior to Admission  Medication Sig Dispense Refill  . amiodarone (PACERONE) 200 MG tablet Take 200 mg by mouth daily.    Marland Kitchen ENTRESTO 97-103 MG Take 1 tablet by mouth 2 (two) times daily.    . FEROSUL 325 (65 Fe) MG tablet Take 325 mg by mouth 2 (two) times daily.    . hydrALAZINE (APRESOLINE) 100 MG tablet Take 100 mg by mouth 3 (three) times daily.    . isosorbide mononitrate (IMDUR) 60 MG 24 hr tablet Take 60 mg by mouth daily.    Marland Kitchen KLOR-CON M20 20 MEQ tablet Take 40 mEq by mouth 2 (two) times daily.    . megestrol (MEGACE) 20 MG tablet Take 40 mg by mouth in the morning and at bedtime. May increase to 4 tablets ( 80 mg) twice daily in the event of heavy breathing    . metoprolol (TOPROL-XL) 200 MG 24 hr tablet Take 200 mg by mouth daily.    Marland Kitchen torsemide (DEMADEX) 20 MG tablet Take 40 mg by mouth 2 (two) times daily.      Blood pressure (!) 163/83, pulse 100, temperature 99 F (37.2 C), temperature source Axillary, resp. rate (!) 24, height 5\' 5"  (1.651 m), weight 74 kg, SpO2 96 %. Physical Exam:  General: pleasant, WD, overweight female who is laying in  bed in NAD HEENT: head is normocephalic, atraumatic.  Sclera are noninjected.  PERRL.  Ears and nose without any masses or lesions.  Mouth is pink and lips dry, cortrak present  Heart: sinus tachycardia in the 110s.  Normal s1,s2. No obvious murmurs, gallops, or rubs noted.  Palpable radial and pedal pulses bilaterally Lungs: trach present, some mild wheezing bilaterally.  Respiratory effort nonlabored Abd: firm, NT, distended, BS hypoactive, no surgical scars noted MS: all 4 extremities are symmetrical with no cyanosis, clubbing, or edema. Skin: warm and dry with no masses, lesions,  or rashes Neuro: opens eyes to voice and nods to some questions, LUE/LLE paresis Psych: Alert with a blunted affect.   Results for orders placed or performed during the hospital encounter of 05/15/21 (from the past 48 hour(s))  Glucose, capillary     Status: Abnormal   Collection Time: 05/28/21 11:06 AM  Result Value Ref Range   Glucose-Capillary 129 (H) 70 - 99 mg/dL    Comment: Glucose reference range applies only to samples taken after fasting for at least 8 hours.   Comment 1 Notify RN    Comment 2 Document in Chart   Glucose, capillary     Status: Abnormal   Collection Time: 05/28/21  3:20 PM  Result Value Ref Range   Glucose-Capillary 115 (H) 70 - 99 mg/dL    Comment: Glucose reference range applies only to samples taken after fasting for at least 8 hours.   Comment 1 Notify RN    Comment 2 Document in Chart   Glucose, capillary     Status: Abnormal   Collection Time: 05/28/21  7:21 PM  Result Value Ref Range   Glucose-Capillary 115 (H) 70 - 99 mg/dL    Comment: Glucose reference range applies only to samples taken after fasting for at least 8 hours.  Glucose, capillary     Status: Abnormal   Collection Time: 05/28/21 11:05 PM  Result Value Ref Range   Glucose-Capillary 116 (H) 70 - 99 mg/dL    Comment: Glucose reference range applies only to samples taken after fasting for at least 8 hours.  Basic metabolic panel     Status: Abnormal   Collection Time: 05/29/21  2:54 AM  Result Value Ref Range   Sodium 137 135 - 145 mmol/L   Potassium 4.4 3.5 - 5.1 mmol/L   Chloride 108 98 - 111 mmol/L   CO2 21 (L) 22 - 32 mmol/L   Glucose, Bld 112 (H) 70 - 99 mg/dL    Comment: Glucose reference range applies only to samples taken after fasting for at least 8 hours.   BUN 25 (H) 6 - 20 mg/dL   Creatinine, Ser 1.18 (H) 0.44 - 1.00 mg/dL   Calcium 9.3 8.9 - 10.3 mg/dL   GFR, Estimated 57 (L) >60 mL/min    Comment: (NOTE) Calculated using the CKD-EPI Creatinine Equation (2021)     Anion gap 8 5 - 15    Comment: Performed at [facility] [address] [place], [place] [address]  CBC     Status: Abnormal   Collection Time: 05/29/21  2:54 AM  Result Value Ref Range   WBC 17.1 (H) 4.0 - 10.5 K/uL   RBC 4.07 3.87 - 5.11 MIL/uL   Hemoglobin 8.8 (L) 12.0 - 15.0 g/dL    Comment: Reticulocyte Hemoglobin testing may be clinically indicated, consider ordering this additional test [id number]    HCT 30.9 (L) 36.0 - 46.0 %  MCV 75.9 (L) 80.0 - 100.0 fL   MCH 21.6 (L) 26.0 - 34.0 pg   MCHC 28.5 (L) 30.0 - 36.0 g/dL   RDW 27.7 (H) 11.5 - 15.5 %   Platelets 460 (H) 150 - 400 K/uL   nRBC 0.0 0.0 - 0.2 %    Comment: Performed at [facility] [address] [place], [place] [address]  Glucose, capillary     Status: Abnormal   Collection Time: 05/29/21  3:11 AM  Result Value Ref Range   Glucose-Capillary 120 (H) 70 - 99 mg/dL    Comment: Glucose reference range applies only to samples taken after fasting for at least 8 hours.  Glucose, capillary     Status: Abnormal   Collection Time: 05/29/21  7:06 AM  Result Value Ref Range   Glucose-Capillary 124 (H) 70 - 99 mg/dL    Comment: Glucose reference range applies only to samples taken after fasting for at least 8 hours.  Glucose, capillary     Status: Abnormal   Collection Time: 05/29/21 11:13 AM  Result Value Ref Range   Glucose-Capillary 128 (H) 70 - 99 mg/dL    Comment: Glucose reference range applies only to samples taken after fasting for at least 8 hours.  Glucose, capillary     Status: Abnormal   Collection Time: 05/29/21  4:03 PM  Result Value Ref Range   Glucose-Capillary 104 (H) 70 - 99 mg/dL    Comment: Glucose reference range applies only to samples taken after fasting for at least 8 hours.  Glucose, capillary     Status: Abnormal   Collection Time: 05/29/21  7:18 PM  Result Value Ref Range   Glucose-Capillary 125 (H) 70 - 99 mg/dL    Comment: Glucose reference range applies only to samples  taken after fasting for at least 8 hours.  Glucose, capillary     Status: Abnormal   Collection Time: 05/29/21 11:28 PM  Result Value Ref Range   Glucose-Capillary 114 (H) 70 - 99 mg/dL    Comment: Glucose reference range applies only to samples taken after fasting for at least 8 hours.   Comment 1 Notify RN    Comment 2 Document in Chart   Glucose, capillary     Status: Abnormal   Collection Time: 05/30/21  8:14 AM  Result Value Ref Range   Glucose-Capillary 118 (H) 70 - 99 mg/dL    Comment: Glucose reference range applies only to samples taken after fasting for at least 8 hours.   DG Chest Port 1 View  Result Date: 05/30/2021 CLINICAL DATA:  Fever. EXAM: PORTABLE CHEST 1 VIEW COMPARISON:  05/25/2021. FINDINGS: Tracheostomy tube and feeding tube in stable position. Cardiomegaly. No pulmonary venous congestion. Left lower lobe atelectasis/consolidation again noted. Small left pleural effusion. No pneumothorax. IMPRESSION: 1.  Tracheostomy tube and feeding tube in stable position. 2.  Cardiomegaly.  No pulmonary venous congestion. 3. Left lower lobe atelectasis/consolidation again noted. Small left pleural effusion. Electronically Signed   By: Marcello Moores  Register   On: 05/30/2021 09:14      Assessment/Plan Dysphagia  Consult for PEG placement  - unclear whether patient is truly having bowel function - large liquid BM recorded in chart for ON but RN today reported no BM in a few days, check KUB - patient also with 22.9 x 22.9 x 13.8 cm uterine fibroid and abdomen firm on palpation - failed swallow eval earlier in admission, s/p trach now but still has cuffed trach -  consider re-eval once changed to cuffless trach - question of positive blood cultures - need to sort out leukocytosis, ordered abdominal film to r/o ileus and will have MD assess CT with large fibroid  - below per primary attending -  R MCA CVA complicated by [facility] with midline shift and cerebral edema Acute on chronic anemia,  multifactorial  AKI, resolved S/P tracheostomy  MSSA PNA - completed course of ancef Leukocytosis - WBC 17, blood cultures pending  T2DM controlled with diet/exercise Uncontrolled HTN HLD CHF with pEF SVT Uterine fibroids Hx of paranoid psychosis  Norm Parcel, [facility] 05/30/2021, 10:03 AM Please see Amion for pager number during day hours 7:00am-4:30pm

## 2021-05-30 NOTE — Progress Notes (Signed)
Daily Progress Note   [name]       Date: 05/30/2021 DOB: May 03, 1976  Age: 45 y.o. MRN#: [id number] Attending Physician: Debbe Odea, MD Primary Care Physician: Mitzi Hansen, MD Admit Date: 05/15/2021  Reason for Consultation/Follow-up: Establishing goals of care  Subjective: Denies pain/discomfort  Length of Stay: 15  Current Medications: Scheduled Meds:  . sodium chloride   Intravenous Once  . amiodarone  200 mg Per Tube Daily  . amLODipine  10 mg Per Tube Daily  . aspirin  81 mg Per Tube Daily  . atorvastatin  40 mg Per Tube Daily  . carvedilol  25 mg Per Tube BID WC  . chlorhexidine gluconate (MEDLINE KIT)  15 mL Mouth Rinse BID  . Chlorhexidine Gluconate Cloth  6 each Topical Daily  . feeding supplement (PROSource TF)  45 mL Per Tube BID  . free water  200 mL Per Tube Q6H  . hydrALAZINE  100 mg Per Tube Q8H  . isosorbide dinitrate  40 mg Per Tube TID  . mouth rinse  15 mL Mouth Rinse 10 times per day  . megestrol  80 mg Per Tube TID  . multivitamin with minerals  1 tablet Per Tube Daily  . pantoprazole sodium  40 mg Per Tube Daily  . sacubitril-valsartan  1 tablet Per Tube BID    Continuous Infusions: . feeding supplement (OSMOLITE 1.5 CAL) 1,000 mL (05/29/21 2204)    PRN Meds: acetaminophen **OR** acetaminophen (TYLENOL) oral liquid 160 mg/5 mL **OR** acetaminophen, loperamide HCl, simethicone  Physical Exam Constitutional:      General: She is not in acute distress.    Comments: Opens eyes but does not focus on me, follows commands on right side  Cardiovascular:     Rate and Rhythm: Normal rate and regular rhythm.  Skin:    General: Skin is warm and dry.             Vital Signs: BP (!) 148/78   Pulse (!) 106   Temp 99.3 F (37.4 C)  (Axillary)   Resp (!) 26   Ht '5\' 5"'  (1.651 m)   Wt 74 kg   LMP  (LMP Unknown) Comment: pt not alert  SpO2 97%   BMI 27.15 kg/m  SpO2: SpO2: 97 % O2 Device: O2 Device: Tracheostomy Collar O2 Flow Rate: O2 Flow Rate (L/min): 5 L/min  Intake/output summary:   Intake/Output Summary (Last 24 hours) at 05/30/2021 1321 Last data filed at 05/30/2021 1208 Gross per 24 hour  Intake 750 ml  Output 1600 ml  Net -850 ml   LBM: Last BM Date: 05/29/21 Baseline Weight: Weight: 72.6 kg Most recent weight: Weight: 74 kg      Flowsheet Rows   Flowsheet Row Most Recent Value  Intake Tab   Referral Department Critical care  Unit at Time of Referral ICU  Palliative Care Primary Diagnosis Neurology  Date Notified 05/22/21  Palliative Care Type New Palliative care  Reason for referral Clarify Goals of Care  Date of Admission 05/15/21  Date first seen by Palliative Care 05/23/21  # of days Palliative referral response time 1 Day(s)  # of days IP prior to Palliative referral 7  Clinical Assessment   Psychosocial & Spiritual Assessment   Palliative Care Outcomes       Patient Active Problem List   Diagnosis Date Noted  . Fibroids 05/25/2021  . Iron deficiency anemia due to chronic blood loss 05/25/2021  . Cerebral edema ([place]) 05/25/2021  . Chronic HFrEF (heart failure with reduced ejection fraction) ([place]) 05/25/2021  . Pneumonia due to methicillin susceptible Staphylococcus aureus ([place]) 05/25/2021  . Acute respiratory failure with hypoxia ([place])   . 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 female  with past medical history of uncontrolled HTN,tobacco use disorder, uterine fibroids, heart failure, paroxysmal SVT, paranoid psychosis admitted on 05/15/2021 with AMS. She was diagnosed with acute large MCA stroke with subarachnoid hemorrhage and midline shift requiring mechanical ventilation for airway protection and  hyperosmolar therapy for cerebral edema. PMT consulted to discuss [place]  Assessment: Patient is more interactive than when I last saw her - on trach collar and moved out of ICU. Follows commands well. Wakes easily to voice. Unable to focus her eyes on me.   I call her brother [name] to give an update. We review her status. He is encouraged by her improvements. We review recent imaging and labs per his request. We review notes from SLP and PT.   We review code status - this was discussed during previous meeting and he requested time to consider. We tells me after thinking about it and discussing with other family he does not want his sister do go through CPR. We review why this is appropriate for [name].   [name] does share concerns about Sandeep's eventual need for a PEG tube - he is concerned this will be problematic d/t her fibroids.   [name] shares frustration about medicaid and disability application. He asks for hospital assistance. I explain to him that he will need to speak with DSS. He has already spoken to [facility] and was referred to DSS.  [name] has my number and knows to call with any further questions or concerns.   Recommendations/Plan: Code status changed to NO CPR Will follow intermittently  Goals of Care and Additional Recommendations: Limitations on Scope of Treatment: Full Scope Treatment  Code Status: Limited code - NO CPR  Prognosis:  Unable to determine  Discharge Planning: To Be Determined  Care plan was discussed with patient's brother  Thank you for allowing the Palliative Medicine Team to assist in the care of this patient.   Total Time 40 minutes Prolonged Time Billed  no       Greater than 50%  of this time was spent counseling and coordinating care related to the above assessment and plan.  Juel Burrow, DNP, [facility] Palliative Medicine Team Team Phone # [phone]  Pager [phone]

## 2021-05-30 NOTE — Progress Notes (Addendum)
PROGRESS NOTE    Holly Bradley   OVF:[id number]  DOB: 08-Jan-1976  DOA: 05/15/2021 PCP: Mitzi Hansen, MD   Brief Narrative:  Holly Bradley is a 45  Y/o with HTN, DM 2, large uterine fibroids, pSVT, HFpEF, paranoid psychosis who presented on 5/24 with confusion.  She was found to have a right MCA M1 occlusion and underwent a thrombectomy which was complicated by contrast extravasation and M3 coil embolization and revascularization. She developed a subarachnoid hemorrhage. Repeat imaging reveal a right basal ganglia infarct with a 3 mm right to left shift. She was started on hypertonic saline. On 5/26 she developed respiratory distress and was intubated. On 5/26 a head CT showed increased cytotoxic edema and a left sided shift with was 5 mm. She has not had improvement in mental status and now has a trach. She continues to require tube feeds. She has been treated for MSSA pneumonia with Ancef (course complted on 6/7)  Blood cultures on 5/30 noted to be positive for staph Epi. These were repeated and remain positive. Ancef has been resumed.    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 complicated by ruptured right M3 causing which was successfully embolized Dysphagia - currently awake but minimally communicative with trach and tube feeds - hypercoagulable w/u results pending - Family would like PEG tube- general surgery following with a plan for open G tube due to extensive fibroids and difficult anatomy - cont Lipitor and ASA 81 mg  Active Problems:   Acute respiratory failure with hypoxia  - continue Trach per PCCM  MSSA pneumonia - 1 wk of antibiotics completed on 6/6  Staph epidermidis bacteremia, fever, leucocytosis> sepsis - noted on blood cultures from 5/30 and again on repeat culture from 6/6 -  restarted Ancef       Fibroids - extremely large fibroids palpable on exam        Iron deficiency anemia due to chronic blood loss - Hb ~ 7- 8 - follow     Chronic systolic and diastolic CHF - ECHO on 8/41 reveals that EF has improved to normal and she has grade 2 d CHF - cont Entresto, Imdur and Coreg  - Hydralazine and Demadex on hold      H/o PSVT - cont Amiodarone (home med)  HTN - cont Coreg, Amlodipine, Hydralazine, Isosorbide  Tobacco abuse - prior to admission  Thrombocytosis - ? Acute phase reactant in setting of infection- follow  Time spent in minutes: 35 DVT prophylaxis: SCD's Start: 05/15/21 0753 Code Status: no CPR Family Communication:  Level of Care: Level of care: Med-Surg Disposition Plan:  Status is: Inpatient  Remains inpatient appropriate because:IV treatments appropriate due to intensity of illness or inability to take PO   Dispo: The patient is from: Home              Anticipated d/c is to:  TBD              Patient currently is not medically stable to d/c.   Difficult to place patient Yes   Consultants:  Neurology PCCM Palliative care IR Procedures:  thrombectomy Antimicrobials:  Anti-infectives (From admission, onward)    Start     Dose/Rate Route Frequency Ordered Stop   05/30/21 1515  ceFAZolin (ANCEF) IVPB 2g/100 mL premix        2 g 200 mL/hr over 30 Minutes Intravenous Every 8 hours  05/30/21 1415     05/21/21 1100  ceFAZolin (ANCEF) IVPB 2g/100 mL premix        2 g 200 mL/hr over 30 Minutes Intravenous Every 8 hours 05/21/21 1030 05/28/21 0537        Objective: Vitals:   05/30/21 1502 05/30/21 1619 05/30/21 1712 05/30/21 1715  BP: (!) 163/76  (!) 155/70   Pulse: (!) 102  (!) 110 (!) 109  Resp:    (!) 21  Temp: 100 F (37.8 C) 98.8 F (37.1 C)    TempSrc:      SpO2:    98%  Weight:      Height:        Intake/Output Summary (Last 24 hours) at 05/30/2021 1805 Last data filed at 05/30/2021 1208 Gross per 24 hour  Intake 550 ml  Output 1000 ml  Net -450 ml   Filed Weights   05/29/21 0450  05/29/21 2151 05/30/21 0322  Weight: 74 kg 74 kg 74 kg    Examination: General exam: Appears comfortable  HEENT: PERRLA, oral mucosa moist, no sclera icterus or thrush- trach in place Respiratory system: Clear to auscultation. Respiratory effort normal. Cardiovascular system: S1 & S2 heard, regular rate and rhythm Gastrointestinal system: Abdomen soft, non-tender, nondistended. Normal bowel sounds   Central nervous system: Alert - dense left hemiplegia- follows commands- no speaking currently Extremities: No cyanosis, clubbing - edema of hands noted- asked to keep right hand elevated Skin: No rashes or ulcers        Data Reviewed: I have personally reviewed following labs and imaging studies  CBC: Recent Labs  Lab 05/26/21 0244 05/27/21 0314 05/28/21 0430 05/29/21 0254 05/30/21 1041  WBC 15.5* 17.7* 19.4* 17.1* 13.2*  HGB 7.5* 9.1* 10.0* 8.8* 7.9*  HCT 26.2* 31.7* 35.2* 30.9* 27.4*  MCV 76.2* 76.0* 77.2* 75.9* 76.3*  PLT 314 392 427* 460* 350*   Basic Metabolic Panel: Recent Labs  Lab 05/26/21 0244 05/27/21 0314 05/28/21 0430 05/29/21 0254 05/30/21 1041  NA 147* 142 140 137 139  K 4.1 4.2 5.3* 4.4 4.7  CL 114* 113* 109 108 111  CO2 23 22 21* 21* 22  GLUCOSE 118* 111* 101* 112* 118*  BUN 25* 27* 26* 25* 20  CREATININE 1.50* 1.43* 1.30* 1.18* 1.17*  CALCIUM 9.3 8.6* 9.1 9.3 9.0   GFR: Estimated Creatinine Clearance: 59.9 mL/min (A) (by C-G formula based on SCr of 1.17 mg/dL (H)). Liver Function Tests: No results for input(s): AST, ALT, ALKPHOS, BILITOT, PROT, ALBUMIN in the last 168 hours. No results for input(s): LIPASE, AMYLASE in the last 168 hours. No results for input(s): AMMONIA in the last 168 hours. Coagulation Profile: No results for input(s): INR, PROTIME in the last 168 hours. Cardiac Enzymes: No results for input(s): CKTOTAL, CKMB, CKMBINDEX, TROPONINI in the last 168 hours. BNP (last 3 results) No results for input(s): PROBNP in the last 8760  hours. HbA1C: No results for input(s): HGBA1C in the last 72 hours. CBG: Recent Labs  Lab 05/29/21 2328 05/30/21 0319 05/30/21 0814 05/30/21 1204 05/30/21 1556  GLUCAP 114* 111* 118* 110* 112*   Lipid Profile: No results for input(s): CHOL, HDL, LDLCALC, TRIG, CHOLHDL, LDLDIRECT in the last 72 hours. Thyroid Function Tests: No results for input(s): TSH, T4TOTAL, FREET4, T3FREE, THYROIDAB in the last 72 hours. Anemia Panel: No results for input(s): VITAMINB12, FOLATE, FERRITIN, TIBC, IRON, RETICCTPCT in the last 72 hours. Urine analysis:    Component Value Date/Time   COLORURINE STRAW (A) 05/15/2021 0445  APPEARANCEUR CLEAR 05/15/2021 0445   LABSPEC 1.005 05/15/2021 0445   PHURINE 7.0 05/15/2021 0445   GLUCOSEU NEGATIVE 05/15/2021 0445   HGBUR SMALL (A) 05/15/2021 [place] 05/15/2021 [place] 05/15/2021 0445   PROTEINUR 100 (A) 05/15/2021 0445   NITRITE NEGATIVE 05/15/2021 0445   LEUKOCYTESUR NEGATIVE 05/15/2021 0445   Sepsis Labs: '@LABRCNTIP' (procalcitonin:4,lacticidven:4) ) Recent Results (from the past 240 hour(s))  Culture, blood (Routine X 2) w Reflex to ID Panel     Status: Abnormal   Collection Time: 05/21/21 11:03 AM   Specimen: BLOOD  Result Value Ref Range Status   Specimen Description BLOOD BLOOD RIGHT HAND  Final   Special Requests   Final    BOTTLES DRAWN AEROBIC ONLY Blood Culture adequate volume   Culture  Setup Time   Final    GRAM POSITIVE COCCI IN CLUSTERS AEROBIC BOTTLE ONLY CRITICAL VALUE NOTED.  VALUE IS CONSISTENT WITH PREVIOUSLY REPORTED AND CALLED VALUE.    Culture (A)  Final    STAPHYLOCOCCUS EPIDERMIDIS SUSCEPTIBILITIES PERFORMED ON PREVIOUS CULTURE WITHIN THE LAST 5 DAYS. Performed at [facility], [place] [address], [place], [place] [address]    Report Status 05/25/2021 FINAL  Final  Culture, blood (Routine X 2) w Reflex to ID Panel     Status: Abnormal   Collection Time: 05/21/21 11:09 AM    Specimen: BLOOD  Result Value Ref Range Status   Specimen Description BLOOD BLOOD RIGHT HAND  Final   Special Requests   Final    BOTTLES DRAWN AEROBIC ONLY Blood Culture results may not be optimal due to an inadequate volume of blood received in culture bottles   Culture  Setup Time   Final    GRAM POSITIVE COCCI IN CLUSTERS AEROBIC BOTTLE ONLY CRITICAL RESULT CALLED TO, READ BACK BY AND VERIFIED WITH: PHARM D M.Berline Lopes ON [id number] AT 1050 BY E.PARRISH Performed at [facility], [place] [address], [place], [place] [address]    Culture (A)  Final    STAPHYLOCOCCUS EPIDERMIDIS STAPHYLOCOCCUS HOMINIS    Report Status 05/25/2021 FINAL  Final   Organism ID, Bacteria STAPHYLOCOCCUS EPIDERMIDIS  Final   Organism ID, Bacteria STAPHYLOCOCCUS HOMINIS  Final      Susceptibility   Staphylococcus epidermidis - MIC*    CIPROFLOXACIN <=0.5 SENSITIVE Sensitive     ERYTHROMYCIN <=0.25 SENSITIVE Sensitive     GENTAMICIN <=0.5 SENSITIVE Sensitive     OXACILLIN <=0.25 SENSITIVE Sensitive     TETRACYCLINE <=1 SENSITIVE Sensitive     VANCOMYCIN 1 SENSITIVE Sensitive     TRIMETH/SULFA <=10 SENSITIVE Sensitive     CLINDAMYCIN <=0.25 SENSITIVE Sensitive     RIFAMPIN <=0.5 SENSITIVE Sensitive     Inducible Clindamycin NEGATIVE Sensitive     * STAPHYLOCOCCUS EPIDERMIDIS   Staphylococcus hominis - MIC*    CIPROFLOXACIN <=0.5 SENSITIVE Sensitive     ERYTHROMYCIN <=0.25 SENSITIVE Sensitive     GENTAMICIN <=0.5 SENSITIVE Sensitive     OXACILLIN RESISTANT Resistant     TETRACYCLINE <=1 SENSITIVE Sensitive     VANCOMYCIN <=0.5 SENSITIVE Sensitive     TRIMETH/SULFA <=10 SENSITIVE Sensitive     CLINDAMYCIN <=0.25 SENSITIVE Sensitive     RIFAMPIN <=0.5 SENSITIVE Sensitive     Inducible Clindamycin NEGATIVE Sensitive     * STAPHYLOCOCCUS HOMINIS  Blood Culture ID Panel (Reflexed)     Status: Abnormal   Collection Time: 05/21/21 11:09 AM  Result Value Ref Range Status   Enterococcus faecalis  NOT DETECTED  NOT DETECTED Final   Enterococcus Faecium NOT DETECTED NOT DETECTED Final   Listeria monocytogenes NOT DETECTED NOT DETECTED Final   Staphylococcus species DETECTED (A) NOT DETECTED Final    Comment: CRITICAL RESULT CALLED TO, READ BACK BY AND VERIFIED WITH: PHARM D M.TUCKER ON [id number] AT 1050 BY E.PARRISH    Staphylococcus aureus (BCID) NOT DETECTED NOT DETECTED Final   Staphylococcus epidermidis DETECTED (A) NOT DETECTED Final    Comment: Methicillin (oxacillin) resistant coagulase negative staphylococcus. Possible blood culture contaminant (unless isolated from more than one blood culture draw or clinical case suggests pathogenicity). No antibiotic treatment is indicated for blood  culture contaminants. CRITICAL RESULT CALLED TO, READ BACK BY AND VERIFIED WITH: PHARM D M.TUCKER ON [id number] AT 1050 BY E.PARRISH    Staphylococcus lugdunensis NOT DETECTED NOT DETECTED Final   Streptococcus species NOT DETECTED NOT DETECTED Final   Streptococcus agalactiae NOT DETECTED NOT DETECTED Final   Streptococcus pneumoniae NOT DETECTED NOT DETECTED Final   Streptococcus pyogenes NOT DETECTED NOT DETECTED Final   A.calcoaceticus-baumannii NOT DETECTED NOT DETECTED Final   Bacteroides fragilis NOT DETECTED NOT DETECTED Final   Enterobacterales NOT DETECTED NOT DETECTED Final   Enterobacter cloacae complex NOT DETECTED NOT DETECTED Final   Escherichia coli NOT DETECTED NOT DETECTED Final   Klebsiella aerogenes NOT DETECTED NOT DETECTED Final   Klebsiella oxytoca NOT DETECTED NOT DETECTED Final   Klebsiella pneumoniae NOT DETECTED NOT DETECTED Final   Proteus species NOT DETECTED NOT DETECTED Final   Salmonella species NOT DETECTED NOT DETECTED Final   Serratia marcescens NOT DETECTED NOT DETECTED Final   Haemophilus influenzae NOT DETECTED NOT DETECTED Final   Neisseria meningitidis NOT DETECTED NOT DETECTED Final   Pseudomonas aeruginosa NOT DETECTED NOT DETECTED Final   Stenotrophomonas  maltophilia NOT DETECTED NOT DETECTED Final   Candida albicans NOT DETECTED NOT DETECTED Final   Candida auris NOT DETECTED NOT DETECTED Final   Candida glabrata NOT DETECTED NOT DETECTED Final   Candida krusei NOT DETECTED NOT DETECTED Final   Candida parapsilosis NOT DETECTED NOT DETECTED Final   Candida tropicalis NOT DETECTED NOT DETECTED Final   Cryptococcus neoformans/gattii NOT DETECTED NOT DETECTED Final   Methicillin resistance mecA/C DETECTED (A) NOT DETECTED Final    Comment: CRITICAL RESULT CALLED TO, READ BACK BY AND VERIFIED WITH: PHARM D M.Berline Lopes ON [id number] AT 1050 BY E.PARRISH Performed at [facility], [place] [address], [place], [place] [address]   Culture, Respiratory w Gram Stain     Status: None   Collection Time: 05/28/21  7:43 AM   Specimen: Tracheal Aspirate; Respiratory  Result Value Ref Range Status   Specimen Description TRACHEAL ASPIRATE  Final   Special Requests NONE  Final   Gram Stain   Final    FEW WBC PRESENT,BOTH PMN AND MONONUCLEAR RARE GRAM POSITIVE COCCI IN PAIRS    Culture   Final    Normal respiratory flora-no Staph aureus or Pseudomonas seen Performed at [facility], [address], [place], [place] [address]    Report Status 05/30/2021 FINAL  Final  Culture, blood (routine x 2)     Status: Abnormal (Preliminary result)   Collection Time: 05/28/21  8:27 AM   Specimen: BLOOD  Result Value Ref Range Status   Specimen Description BLOOD RIGHT ANTECUBITAL  Final   Special Requests   Final    BOTTLES DRAWN AEROBIC AND ANAEROBIC Blood Culture adequate volume   Culture  Setup Time  Final    GRAM POSITIVE COCCI IN CLUSTERS AEROBIC BOTTLE ONLY CRITICAL VALUE NOTED.  VALUE IS CONSISTENT WITH PREVIOUSLY REPORTED AND CALLED VALUE.    Culture (A)  Final    STAPHYLOCOCCUS EPIDERMIDIS SUSCEPTIBILITIES TO FOLLOW Performed at [facility], [place] [address] [place], [place] [address]    Report Status PENDING  Incomplete  Culture,  blood (routine x 2)     Status: Abnormal (Preliminary result)   Collection Time: 05/28/21  8:28 AM   Specimen: BLOOD  Result Value Ref Range Status   Specimen Description BLOOD RIGHT ANTECUBITAL  Final   Special Requests   Final    BOTTLES DRAWN AEROBIC AND ANAEROBIC Blood Culture adequate volume   Culture  Setup Time   Final    GRAM POSITIVE COCCI IN CLUSTERS AEROBIC BOTTLE ONLY CRITICAL VALUE NOTED.  VALUE IS CONSISTENT WITH PREVIOUSLY REPORTED AND CALLED VALUE. Performed at [facility], [place] [address], [place], [place] [address]    Culture STAPHYLOCOCCUS EPIDERMIDIS (A)  Final   Report Status PENDING  Incomplete         Radiology Studies: DG Chest Port 1 View  Result Date: 05/30/2021 CLINICAL DATA:  Fever. EXAM: PORTABLE CHEST 1 VIEW COMPARISON:  05/25/2021. FINDINGS: Tracheostomy tube and feeding tube in stable position. Cardiomegaly. No pulmonary venous congestion. Left lower lobe atelectasis/consolidation again noted. Small left pleural effusion. No pneumothorax. IMPRESSION: 1.  Tracheostomy tube and feeding tube in stable position. 2.  Cardiomegaly.  No pulmonary venous congestion. 3. Left lower lobe atelectasis/consolidation again noted. Small left pleural effusion. Electronically Signed   By: Marcello Moores  Register   On: 05/30/2021 09:14   DG Abd Portable 1V  Result Date: 05/30/2021 CLINICAL DATA:  Ileus. EXAM: PORTABLE ABDOMEN - 1 VIEW COMPARISON:  05/26/2021. FINDINGS: Feeding tube noted with tip over the stomach. No bowel distention. No free air. Large abdominopelvic mass again noted. Reference is made to CT report of 05/17/2021. IMPRESSION: 1. Feeding tube noted with tip over the stomach. No bowel distention. 2. Large abdominopelvic mass again noted. Reference is made to CT report of 05/17/2021. Electronically Signed   By: Marcello Moores  Register   On: 05/30/2021 12:46      Scheduled Meds:  amiodarone  200 mg Per Tube Daily   amLODipine  10 mg Per Tube Daily   aspirin  81 mg  Per Tube Daily   atorvastatin  40 mg Per Tube Daily   carvedilol  25 mg Per Tube BID WC   chlorhexidine gluconate (MEDLINE KIT)  15 mL Mouth Rinse BID   Chlorhexidine Gluconate Cloth  6 each Topical Daily   feeding supplement (PROSource TF)  45 mL Per Tube BID   free water  200 mL Per Tube Q6H   hydrALAZINE  100 mg Per Tube Q8H   isosorbide dinitrate  40 mg Per Tube TID   mouth rinse  15 mL Mouth Rinse 10 times per day   megestrol  80 mg Per Tube TID   multivitamin with minerals  1 tablet Per Tube Daily   pantoprazole sodium  40 mg Per Tube Daily   sacubitril-valsartan  1 tablet Per Tube BID   Continuous Infusions:   ceFAZolin (ANCEF) IV 2 g (05/30/21 1518)   feeding supplement (OSMOLITE 1.5 CAL) 1,000 mL (05/29/21 2204)     LOS: 15 days      Debbe Odea, MD [facility] Pager: [email or web address] 05/30/2021, 6:05 PM

## 2021-05-30 NOTE — Progress Notes (Signed)
MD notified due to elevated blood pressure and heart rate. No PRN medication and no parameters noted for blood pressure. Awaiting call back.

## 2021-05-31 DIAGNOSIS — J15211 Pneumonia due to Methicillin susceptible Staphylococcus aureus: Secondary | ICD-10-CM

## 2021-05-31 DIAGNOSIS — D500 Iron deficiency anemia secondary to blood loss (chronic): Secondary | ICD-10-CM

## 2021-05-31 DIAGNOSIS — Z930 Tracheostomy status: Secondary | ICD-10-CM

## 2021-05-31 LAB — CBC WITH DIFFERENTIAL/PLATELET
Abs Immature Granulocytes: 0.11 10*3/uL — ABNORMAL HIGH (ref 0.00–0.07)
Basophils Absolute: 0 10*3/uL (ref 0.0–0.1)
Basophils Relative: 0 %
Eosinophils Absolute: 0.1 10*3/uL (ref 0.0–0.5)
Eosinophils Relative: 1 %
HCT: 27.2 % — ABNORMAL LOW (ref 36.0–46.0)
Hemoglobin: 8 g/dL — ABNORMAL LOW (ref 12.0–15.0)
Immature Granulocytes: 1 %
Lymphocytes Relative: 6 %
Lymphs Abs: 0.8 10*3/uL (ref 0.7–4.0)
MCH: 22.5 pg — ABNORMAL LOW (ref 26.0–34.0)
MCHC: 29.4 g/dL — ABNORMAL LOW (ref 30.0–36.0)
MCV: 76.6 fL — ABNORMAL LOW (ref 80.0–100.0)
Monocytes Absolute: 0.9 10*3/uL (ref 0.1–1.0)
Monocytes Relative: 7 %
Neutro Abs: 11.1 10*3/uL — ABNORMAL HIGH (ref 1.7–7.7)
Neutrophils Relative %: 85 %
Platelets: 533 10*3/uL — ABNORMAL HIGH (ref 150–400)
RBC: 3.55 MIL/uL — ABNORMAL LOW (ref 3.87–5.11)
RDW: 29.3 % — ABNORMAL HIGH (ref 11.5–15.5)
WBC: 13.1 10*3/uL — ABNORMAL HIGH (ref 4.0–10.5)
nRBC: 0.2 % (ref 0.0–0.2)

## 2021-05-31 LAB — ANTIPHOSPHOLIPID SYNDROME EVAL, BLD
Anticardiolipin IgA: <9 APL U/mL (ref 0–11)
Anticardiolipin IgG: <9 GPL U/mL (ref 0–14)
Anticardiolipin IgM: 17 MPL U/mL — ABNORMAL HIGH (ref 0–12)
DRVVT: 80.2 s — ABNORMAL HIGH (ref 0.0–47.0)
PTT Lupus Anticoagulant: 46.4 s (ref 0.0–51.9)
Phosphatydalserine, IgA: 4 APS Units (ref 0–19)
Phosphatydalserine, IgG: 9 Units (ref 0–30)
Phosphatydalserine, IgM: 102 Units — ABNORMAL HIGH (ref 0–30)

## 2021-05-31 LAB — CULTURE, BLOOD (ROUTINE X 2): Special Requests: ADEQUATE

## 2021-05-31 LAB — BASIC METABOLIC PANEL
Anion gap: 10 (ref 5–15)
BUN: 19 mg/dL (ref 6–20)
CO2: 20 mmol/L — ABNORMAL LOW (ref 22–32)
Calcium: 9 mg/dL (ref 8.9–10.3)
Chloride: 107 mmol/L (ref 98–111)
Creatinine, Ser: 1.12 mg/dL — ABNORMAL HIGH (ref 0.44–1.00)
GFR, Estimated: >60 mL/min (ref >60)
Glucose, Bld: 113 mg/dL — ABNORMAL HIGH (ref 70–99)
Potassium: 4.6 mmol/L (ref 3.5–5.1)
Sodium: 137 mmol/L (ref 135–145)

## 2021-05-31 LAB — GLUCOSE, CAPILLARY
Glucose-Capillary: 101 mg/dL — ABNORMAL HIGH (ref 70–99)
Glucose-Capillary: 109 mg/dL — ABNORMAL HIGH (ref 70–99)
Glucose-Capillary: 128 mg/dL — ABNORMAL HIGH (ref 70–99)
Glucose-Capillary: 114 mg/dL — ABNORMAL HIGH (ref 70–99)
Glucose-Capillary: 105 mg/dL — ABNORMAL HIGH (ref 70–99)
Glucose-Capillary: 113 mg/dL — ABNORMAL HIGH (ref 70–99)

## 2021-05-31 LAB — LACTIC ACID, PLASMA: Lactic Acid, Venous: 0.7 mmol/L (ref 0.5–1.9)

## 2021-05-31 LAB — DRVVT CONFIRM: dRVVT Confirm: 1.6 ratio — ABNORMAL HIGH (ref 0.8–1.2)

## 2021-05-31 LAB — DRVVT MIX: dRVVT Mix: 57.3 s — ABNORMAL HIGH (ref 0.0–40.4)

## 2021-05-31 LAB — FACTOR 5 LEIDEN

## 2021-05-31 MED ORDER — FERROUS SULFATE 300 (60 FE) MG/5ML PO SYRP
300.0000 mg | ORAL_SOLUTION | Freq: Two times a day (BID) | ORAL | Status: DC
  Administered 2021-05-31 – 2021-06-21 (×42): 300 mg
  Filled 2021-05-31 (×42): qty 5

## 2021-05-31 MED ORDER — ACETAMINOPHEN 160 MG/5ML PO SOLN
650.0000 mg | ORAL | Status: DC | PRN
  Administered 2021-06-01 – 2021-06-30 (×12): 650 mg
  Filled 2021-05-31 (×12): qty 20.3

## 2021-05-31 MED ORDER — ACETAMINOPHEN 650 MG RE SUPP
650.0000 mg | RECTAL | Status: DC | PRN
Start: 2021-05-31 — End: 2021-07-04
  Filled 2021-05-31: qty 1

## 2021-05-31 MED ORDER — MEGESTROL ACETATE 40 MG PO TABS
80.0000 mg | ORAL_TABLET | Freq: Three times a day (TID) | ORAL | Status: DC
  Filled 2021-05-31: qty 2

## 2021-05-31 MED ORDER — MEGESTROL ACETATE 40 MG PO TABS
80.0000 mg | ORAL_TABLET | Freq: Three times a day (TID) | ORAL | Status: DC
  Administered 2021-05-31 – 2021-06-21 (×62): 80 mg
  Filled 2021-05-31 (×64): qty 2

## 2021-05-31 MED ORDER — ACETAMINOPHEN 325 MG PO TABS
650.0000 mg | ORAL_TABLET | ORAL | Status: DC | PRN
Start: 2021-05-31 — End: 2021-07-04
  Administered 2021-06-09 – 2021-07-03 (×8): 650 mg
  Filled 2021-05-31 (×8): qty 2

## 2021-05-31 MED ORDER — FERROUS SULFATE 325 (65 FE) MG PO TABS
325.0000 mg | ORAL_TABLET | Freq: Two times a day (BID) | ORAL | Status: DC
  Filled 2021-05-31 (×2): qty 1

## 2021-05-31 NOTE — Consult Note (Signed)
[facility]    Date of Admission:  05/15/2021     Total days of antibiotics 8               Reason for Consult: Coag Neg Staph Bacteremia   Referring Provider: Wynelle Cleveland Primary Care Provider: Mitzi Hansen, MD   ASSESSMENT:  Holly Bradley is a 45 y/o AA female admitted MCA CVA s/p embolization complicated by contrast extravasation and respiratory failure requiring intubation s/p tracheostomy and development of MSSA pneumonia s/p 7 days of Cefazolin now having elevated temperatures and blood culture positive for MRSE in 1 culture and Staphylococcus epidermidis in 1 bottle of the secondary culture both drawn from the right antecubital fossa. These likely represent contamination given cultures being drawn from the same site and have asked microbiology for sensitivities of secondary Staphylococcus epidermidis to see if these are the same organism. Recommend stopping antibiotics and monitoring as WBC count is decreasing down to 13.1 today. Will follow.   PLAN:  Stop cefazolin. Monitor fever and leukocytosis. Await sensitivities from secondary culture. Respiratory management per CCM.    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 (HCC) Active Problems:   Acute respiratory failure with hypoxia (HCC)   Fibroids   Iron deficiency anemia due to chronic blood loss   Cerebral edema (HCC)   Chronic HFrEF (heart failure with reduced ejection fraction) (HCC)   Pneumonia due to methicillin susceptible Staphylococcus aureus (HCC)    amiodarone  200 mg Per Tube Daily   amLODipine  10 mg Per Tube Daily   aspirin  81 mg Per Tube Daily   atorvastatin  40 mg Per Tube Daily   carvedilol  25 mg Per Tube BID WC   chlorhexidine gluconate (MEDLINE KIT)  15 mL Mouth Rinse BID   Chlorhexidine Gluconate Cloth  6 each Topical Daily   feeding supplement (PROSource TF)  45 mL Per Tube BID   free water  200 mL Per Tube Q6H   hydrALAZINE  100 mg  Per Tube Q8H   isosorbide dinitrate  40 mg Per Tube TID   mouth rinse  15 mL Mouth Rinse 10 times per day   megestrol  80 mg Per Tube TID   multivitamin with minerals  1 tablet Per Tube Daily   pantoprazole sodium  40 mg Per Tube Daily   sacubitril-valsartan  1 tablet Per Tube BID     HPI: Holly Bradley is a 45 y.o. female with previous medical history significant for hypertension, uterine fibroids, hear failure, paroxsymal SVT and paranoid psychosis admitted with altered mental status.   Holly Bradley was last seen normal at 1900 on 5/23 and found in the bathroom wedged between the toilet and the sink and was minimally responsive to pain. Initial CT head with acute appearing perforator infarct in the right basal ganglia. MRI brain with moderately large acute right MCA infarct and associated petechial hemorrhage and subarachnoid hemorrhage. CT angio neck with emergent large vessel occlusion at the right M1 origin. IR performed mechanical thrombectomy which was complicated by contrast extravasation s/p embolization of the right MCA M3 branch using coil placement. TTE performed on 5/25 with no vegetation and findings suspicious for infiltrative cardiomyopathy. Intubated on 5/26 secondary to decreased mental status and respiratory distress. Abdomen was also found to have increasing distention with imaging showing a 22.9 cm mass possibly consistent with uterine fibroid.   Holly Bradley was having fevers that started on 5/25 and  respiratory cultures on 5/28 were positive for MSSA and started on Cefazolin for suspected pneumonia. Blood cultures obtained on 5/30 were positive for Staphylococcus Hominis (1/4) and Staphylococcus Epidermidis (2/4). Treated for a total of 7 days with Cefazolin. Able to tolerate spontaneous breathing trial however mental status precluded extubation ultimately requiring tracheostomy on 6/3. Had increased WBC count to 17 on 6/5 which was the last day of Cefazolin and having  temperature spikes without true fever. Blood cultures drawn on 6/6 with MRSE in one culture secondary culture drawn from the same site with Staphylococcus Epidermidis. Currently on Cefazolin. General Surgery has been consulted for placement of PEG tube.   Review of Systems: Review of Systems  Unable to perform ROS: Medical condition    Past Medical History:  Diagnosis Date   Hypertension     Social History   Tobacco Use   Smoking status: Every Day    Pack years: 0.00   Smokeless tobacco: Never    History reviewed. No pertinent family history.  No Known Allergies  OBJECTIVE: Blood pressure 133/75, pulse (!) 103, temperature 100.3 F (37.9 C), resp. rate (!) 23, height '5\' 5"'  (1.651 m), weight 80.2 kg, SpO2 99 %.  Physical Exam Constitutional:      General: She is not in acute distress.    Appearance: She is well-developed.  Cardiovascular:     Rate and Rhythm: Regular rhythm. Tachycardia present.     Heart sounds: Normal heart sounds.  Pulmonary:     Effort: Pulmonary effort is normal.     Breath sounds: Rhonchi present.  Skin:    General: Skin is warm and dry.  Neurological:     Mental Status: She is alert.     Comments: Follows commands on the right side and appears to nod appropriately.     Lab Results Lab Results  Component Value Date   WBC 13.1 (H) 05/31/2021   HGB 8.0 (L) 05/31/2021   HCT 27.2 (L) 05/31/2021   MCV 76.6 (L) 05/31/2021   PLT 533 (H) 05/31/2021    Lab Results  Component Value Date   CREATININE 1.12 (H) 05/31/2021   BUN 19 05/31/2021   NA 137 05/31/2021   K 4.6 05/31/2021   CL 107 05/31/2021   CO2 20 (L) 05/31/2021    Lab Results  Component Value Date   ALT 19 05/15/2021   AST 26 05/15/2021   ALKPHOS 88 05/15/2021   BILITOT 1.0 05/15/2021     Microbiology: Recent Results (from the past 240 hour(s))  Culture, Respiratory w Gram Stain     Status: None   Collection Time: 05/28/21  7:43 AM   Specimen: Tracheal Aspirate;  Respiratory  Result Value Ref Range Status   Specimen Description TRACHEAL ASPIRATE  Final   Special Requests NONE  Final   Gram Stain   Final    FEW WBC PRESENT,BOTH PMN AND MONONUCLEAR RARE GRAM POSITIVE COCCI IN PAIRS    Culture   Final    Normal respiratory flora-no Staph aureus or Pseudomonas seen Performed at [facility], [address], [place], [place] [address]    Report Status 05/30/2021 FINAL  Final  Culture, blood (routine x 2)     Status: Abnormal   Collection Time: 05/28/21  8:27 AM   Specimen: BLOOD  Result Value Ref Range Status   Specimen Description BLOOD RIGHT ANTECUBITAL  Final   Special Requests   Final    BOTTLES DRAWN AEROBIC AND ANAEROBIC Blood Culture adequate volume  Culture  Setup Time   Final    GRAM POSITIVE COCCI IN CLUSTERS AEROBIC BOTTLE ONLY CRITICAL VALUE NOTED.  VALUE IS CONSISTENT WITH PREVIOUSLY REPORTED AND CALLED VALUE. Performed at [name], [place] [address], [place], [place] [address]    Culture STAPHYLOCOCCUS EPIDERMIDIS (A)  Final   Report Status 05/31/2021 FINAL  Final   Organism ID, Bacteria STAPHYLOCOCCUS EPIDERMIDIS  Final      Susceptibility   Staphylococcus epidermidis - MIC*    CIPROFLOXACIN >=8 RESISTANT Resistant     ERYTHROMYCIN >=8 RESISTANT Resistant     GENTAMICIN <=0.5 SENSITIVE Sensitive     OXACILLIN >=4 RESISTANT Resistant     TETRACYCLINE 2 SENSITIVE Sensitive     VANCOMYCIN <=0.5 SENSITIVE Sensitive     TRIMETH/SULFA 80 RESISTANT Resistant     CLINDAMYCIN >=8 RESISTANT Resistant     RIFAMPIN <=0.5 SENSITIVE Sensitive     Inducible Clindamycin NEGATIVE Sensitive     * STAPHYLOCOCCUS EPIDERMIDIS  Culture, blood (routine x 2)     Status: Abnormal   Collection Time: 05/28/21  8:28 AM   Specimen: BLOOD  Result Value Ref Range Status   Specimen Description BLOOD RIGHT ANTECUBITAL  Final   Special Requests   Final    BOTTLES DRAWN AEROBIC AND ANAEROBIC Blood Culture adequate volume   Culture  Setup  Time   Final    GRAM POSITIVE COCCI IN CLUSTERS AEROBIC BOTTLE ONLY CRITICAL VALUE NOTED.  VALUE IS CONSISTENT WITH PREVIOUSLY REPORTED AND CALLED VALUE.    Culture (A)  Final    STAPHYLOCOCCUS EPIDERMIDIS SUSCEPTIBILITIES PERFORMED ON PREVIOUS CULTURE WITHIN THE LAST 5 DAYS. Performed at [facility], [place] [address] [place],  [address]    Report Status 05/31/2021 FINAL  Final     Terri Piedra, NP [facility]  05/31/2021  1:03 PM

## 2021-05-31 NOTE — Progress Notes (Signed)
Patient ID: Holly Bradley, female   DOB: 17-Aug-1976, 45 y.o.   MRN: [id number] Noted bacteremia on repeat blood CX and she has been started on Ancef. Very large uterine mass actually precludes window for PEG placement. We will follow on Acute Care Surgery Service for open G tube once safe to proceed.  Georganna Skeans, MD, MPH, FACS Please use [email or web address] to contact on call provider

## 2021-05-31 NOTE — Plan of Care (Signed)
  Problem: Education: Goal: Knowledge of disease or condition will improve Outcome: Progressing Goal: Knowledge of secondary prevention will improve Outcome: Progressing   Problem: Health Behavior/Discharge Planning: Goal: Ability to manage health-related needs will improve Outcome: Progressing   Problem: Clinical Measurements: Goal: Ability to maintain clinical measurements within normal limits will improve Outcome: Progressing Goal: Will remain free from infection Outcome: Progressing Goal: Diagnostic test results will improve Outcome: Progressing Goal: Respiratory complications will improve Outcome: Progressing Goal: Cardiovascular complication will be avoided Outcome: Progressing   Problem: Coping: Goal: Level of anxiety will decrease Outcome: Progressing   Problem: Elimination: Goal: Will not experience complications related to bowel motility Outcome: Progressing Goal: Will not experience complications related to urinary retention Outcome: Progressing   Problem: Pain Managment: Goal: General experience of comfort will improve Outcome: Progressing   Problem: Skin Integrity: Goal: Risk for impaired skin integrity will decrease Outcome: Progressing

## 2021-05-31 NOTE — Progress Notes (Signed)
Physical Therapy Treatment Patient Details Name: Holly Bradley MRN: [id number] DOB: 05/01/1976 Today's Date: 05/31/2021    History of Present Illness 45 yo female s/p mechanical thrombectomy on 5/24 of right MCA M1/M2/M3 and embolization of right MCA M3 branch due to contrast extravasation. Pt re-intubated 5/26. Repeat head CT on 5/26 showed increased cytotoxic edema and mildly increased leftward midline shift, now 5 mm, decreased SAH and IVH. Abdominal CT showing 22.9 cm mass, possibly consistent with uterine fibroids, no lymphadenopathy. s/p trach 6/3. PMH fibriods HTN HF paranoid psychosis recent hospitalizations january and february 2022    PT Comments    Pt resting upon PT arrival to room, PT and OT saw pt together to progress functional mobility. Pt soiled in urine and stool, as her rectal pouch had come out. Pt requiring total +2 assist for rolling to clean up, as well as total +2 for to/from EOB. Pt tolerated EOB sitting x10 minutes with max posterior support, pt coughing with anterior loss of secretions throughout. Pt with tachypnea up to 30s breaths/min during mobility, recovers to 20s with rest. PT to continue to follow acutely.      Follow Up Recommendations  SNF     Equipment Recommendations  Wheelchair (measurements PT);Wheelchair cushion (measurements PT);Hospital bed;Other (comment) (hoyer lift)    Recommendations for Other Services       Precautions / Restrictions Precautions Precautions: Fall Precaution Comments: L hemiparesis, PRAFO, hand splint,  trach, SBP < 160, cortrak    Mobility  Bed Mobility Overal bed mobility: Needs Assistance Bed Mobility: Rolling;Sidelying to Sit;Sit to Supine Rolling: Total assist;+2 for physical assistance Sidelying to sit: Total assist;+2 for physical assistance   Sit to supine: Total assist;+2 for physical assistance   General bed mobility comments: Total +2 for rolling bilaterally, trunk and LE management, and scooting  to/from EOB.    Transfers                 General transfer comment: NT - EOB only  Ambulation/Gait                 Stairs             Wheelchair Mobility    Modified Rankin (Stroke Patients Only) Modified Rankin (Stroke Patients Only) Pre-Morbid Rankin Score: No symptoms Modified Rankin: Severe disability     Balance Overall balance assessment: Needs assistance Sitting-balance support: Single extremity supported;Feet supported Sitting balance-Leahy Scale: Zero Sitting balance - Comments: max posterior assist to maintain upright sitting, EOB sitting x10 minutes with elbow propping bilat, assisting with gown change and EKG change. Cues for placement of RUE in pt lap to prevent pushing L Postural control: Posterior lean;Left lateral lean                                  Cognition Arousal/Alertness: Awake/alert Behavior During Therapy: Flat affect Overall Cognitive Status: Difficult to assess Area of Impairment: Following commands                       Following Commands: Follows one step commands with increased time Safety/Judgement: Decreased awareness of deficits;Decreased awareness of safety Awareness: Intellectual Problem Solving: Slow processing;Decreased initiation;Difficulty sequencing;Requires verbal cues;Requires tactile cues General Comments: Pt nodding yes/no appropriately during session, gesturing she is hot throughout session. Pt with consistent anterior loss of saliva out of mouth, with no attempt to correct.      Exercises  General Comments General comments (skin integrity, edema, etc.): Pt with rectal pouch apparatus off and pt heavily soiled in stool and urine, RN brought to room to assist with clean up and to assess rectal pouch and skin integrity      Pertinent Vitals/Pain Pain Assessment: Faces Faces Pain Scale: Hurts little more Pain Location: generalized discomfort Pain Descriptors / Indicators:  Grimacing;Discomfort;Other (Comment) (gesturing "hot") Pain Intervention(s): Limited activity within patient's tolerance;Monitored during session;Repositioned    Home Living                      Prior Function            PT Goals (current goals can now be found in the care plan section) Acute Rehab PT Goals Patient Stated Goal: none stated PT Goal Formulation: With patient Time For Goal Achievement: 06/03/21 Potential to Achieve Goals: Fair Progress towards PT goals: Progressing toward goals    Frequency    Min 3X/week      PT Plan Current plan remains appropriate    Co-evaluation PT/OT/SLP Co-Evaluation/Treatment: Yes Reason for Co-Treatment: For patient/therapist safety;To address functional/ADL transfers;Complexity of the patient's impairments (multi-system involvement) PT goals addressed during session: Mobility/safety with mobility;Balance        AM-PAC PT "6 Clicks" Mobility   Outcome Measure  Help needed turning from your back to your side while in a flat bed without using bedrails?: Total Help needed moving from lying on your back to sitting on the side of a flat bed without using bedrails?: Total Help needed moving to and from a bed to a chair (including a wheelchair)?: Total Help needed standing up from a chair using your arms (e.g., wheelchair or bedside chair)?: Total Help needed to walk in hospital room?: Total Help needed climbing 3-5 steps with a railing? : Total 6 Click Score: 6    End of Session Equipment Utilized During Treatment: Other (comment) (trach collar) Activity Tolerance: Other (comment);Patient limited by fatigue (limited by coughing/increased secretions) Patient left: in bed;with call bell/phone within reach;with bed alarm set Nurse Communication: Mobility status PT Visit Diagnosis: Hemiplegia and hemiparesis;Other abnormalities of gait and mobility (R26.89);Unsteadiness on feet (R26.81);Muscle weakness (generalized)  (M62.81);Difficulty in walking, not elsewhere classified (R26.2);Other symptoms and signs involving the nervous system (R29.898);Apraxia (R48.2) Hemiplegia - Right/Left: Left Hemiplegia - dominant/non-dominant: Non-dominant Hemiplegia - caused by: Cerebral infarction;Nontraumatic SAH;Nontraumatic intracerebral hemorrhage     Time: 1500-1546 PT Time Calculation (min) (ACUTE ONLY): 46 min  Charges:  $Therapeutic Activity: 8-22 mins                    Stacie Glaze, PT DPT Acute Rehabilitation Services Pager [phone]  Office [phone]    [place] 05/31/2021, 4:19 PM

## 2021-05-31 NOTE — Progress Notes (Signed)
STROKE TEAM PROGRESS NOTE   INTERVAL HISTORY No family at the bedside. Pt  seems to be breathing well ontrach collar.  But has increased oral secretions..  Neuro stable, she is alert and following commands on the right.   .  Vital signs are stable.  Continues to have low-grade fever.  White count is down to 13.1 today.  She has finished course of antibiotics for MSSA pneumonia.  Trauma team has evaluated her for PEG and will schedule elective open surgical procedure of PEG placement given her large intra-abdominal fibroid    Vitals:   05/31/21 0852 05/31/21 0905 05/31/21 1132 05/31/21 1212  BP: (!) 142/80 (!) 151/81 (!) 157/85 133/75  Pulse: (!) 111 (!) 110 (!) 110 (!) 103  Resp: (!) 27 (!) 23 (!) 29 (!) 23  Temp:  99 F (37.2 C) 100.3 F (37.9 C) 100.3 F (37.9 C)  TempSrc:   Axillary   SpO2: 98% 99% 92% 99%  Weight:      Height:       CBC:  Recent Labs  Lab 05/30/21 1041 05/31/21 0314  WBC 13.2* 13.1*  NEUTROABS  --  11.1*  HGB 7.9* 8.0*  HCT 27.4* 27.2*  MCV 76.3* 76.6*  PLT 501* 102*   Basic Metabolic Panel:  Recent Labs  Lab 05/30/21 1041 05/31/21 0314  NA 139 137  K 4.7 4.6  CL 111 107  CO2 22 20*  GLUCOSE 118* 113*  BUN 20 19  CREATININE 1.17* 1.12*  CALCIUM 9.0 9.0   Lipid Panel:  No results for input(s): CHOL, TRIG, HDL, CHOLHDL, VLDL, LDLCALC in the last 168 hours.  HgbA1c:  No results for input(s): HGBA1C in the last 168 hours. Urine Drug Screen:  No results for input(s): LABOPIA, COCAINSCRNUR, LABBENZ, AMPHETMU, THCU, LABBARB in the last 168 hours.  Alcohol Level  No results for input(s): ETH in the last 168 hours.  IMAGING  CT HEAD WO CONTRAST Result Date: 05/16/2021  IMPRESSION:  1. Continued interval evolution of moderately large right MCA distribution infarct. Associated regional mass effect has mildly increased, with new 2 mm of right-to-left midline shift. No hydrocephalus or trapping.  2. Decreased prominence of subarachnoid hemorrhage at  the posterior right Sylvian fissure and overlying right frontoparietal region. Trace intraventricular hemorrhage related to redistribution.  3. Question new area of parenchymal hypodensity involving the lateral left cerebellum. While this finding is favored to be artifactual, a possible new acute ischemic infarct is difficult to exclude, and could be considered in the correct clinical setting. Attention at follow-up recommended.  4. No other new acute intracranial abnormality.   CT HEAD WO CONTRAST Result Date: 05/15/2021  IMPRESSION: Small-to-moderate volume hyperdensity within the right sylvian fissure and along portions of the right frontoparietal and temporal lobes compatible with extravasated contrast/subarachnoid hemorrhage. There is hyperdensity within the right basal ganglia, right insula, lateral right frontal lobe/frontal operculum and within portions of the right parietal lobe likely reflecting contrast staining at sites of acute infarction. Mild associated mass effect with subtle partial effacement of the right lateral ventricle. No midline shift or hydrocephalus. Embolization material now overlies the lateral right parietal lobe. Stable background parenchymal atrophy and chronic small vessel ischemic disease. Redemonstrated chronic infarct within the superior left cerebellum.   MR BRAIN WO CONTRAST Result Date: 05/15/2021 IMPRESSION:  1. Moderately large acute right MCA infarct. Associated petechial hemorrhage, subarachnoid hemorrhage, and minimal intraventricular hemorrhage.  2. Severe chronic small vessel ischemic disease with multiple chronic infarcts.   MR  CERVICAL SPINE WO CONTRAST Result Date: 05/15/2021  IMPRESSION: The cord itself appears normal. No disc disease. No spinal stenosis. 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 No significant encroachment upon the neural spaces. Some prevertebral soft tissue edema. In this clinical scenario, this may be subsequent to  previous endotracheal intubation. One could not rule out pharyngitis or cellulitis but that seems less likely. I do not see any traumatic injury to the cervical spine itself. Electronically Signed   By: Nelson Chimes M.D.   On: 05/15/2021 16:05   IR CT Head Ltd Result Date: 05/15/2021 CT of the head was obtained and post processed in a separate workstation with concurrent attending physician supervision. Selected images were sent to PACS. Small right sylvian contrast extravasation confirmed. Additionally, hyperdensity of the right basal ganglia and lateral right frontal cortex are noted, related to ongoing ischemia. Right internal carotid artery angiograms with frontal and lateral views of the head showed occlusion of the embolized branch with no evidence of contrast extravasation. Patency of the remaining MCA branches noted.  IR PERCUTANEOUS ART THROMBECTOMY/INFUSION INTRACRANIAL INC DIAG ANGIO Result Date: 05/15/2021 IMPRESSION: Mechanical thrombectomy performed for treatment of right M1/MCA occlusion with clot fragmentation and embolization to same distal territory requiring multiple additional passes. Procedure complicated by contrast extravasation at a distal right M3/MCA branch requiring vessel embolization. Near complete recanalization of the right MCA vascular tree was achieved (TICI 2b). PLAN: - Bed rest post femoral access x6h - Follow-up CT in 4 hours to evaluate for SAH/contrast extravasation stability - Transfer to ICU- SBP: 120-140 mmHg   Echo Result read: 05/16/2021 1. Left ventricular ejection fraction, by estimation, is 55 to 60%. The  left ventricle has normal function. The left ventricle has no regional  wall motion abnormalities. There is moderate concentric left ventricular  hypertrophy. Left ventricular  diastolic parameters are consistent with Grade II diastolic dysfunction  (pseudonormalization). Elevated left atrial pressure.   2. Right ventricular systolic function is normal.  The right ventricular  size is normal. Mildly increased right ventricular wall thickness. There  is moderately elevated pulmonary artery systolic pressure. The estimated  right ventricular systolic pressure   is 51.7 mmHg.   3. Left atrial size was severely dilated.   4. Right atrial size was severely dilated.   5. There is no evidence of cardiac tamponade.   6. The mitral valve is normal in structure. Trivial mitral valve  regurgitation. No evidence of mitral stenosis.   7. The aortic valve is normal in structure. There is mild calcification  of the aortic valve. There is mild thickening of the aortic valve. Aortic  valve regurgitation is not visualized. Mild aortic valve sclerosis is  present, with no evidence of aortic  valve stenosis.   8. The inferior vena cava is dilated in size with <50% respiratory  variability, suggesting right atrial pressure of 15 mmHg.   PHYSICAL EXAM  Temp:  [98.8 F (37.1 C)-100.3 F (37.9 C)] 100.3 F (37.9 C) (06/09 1212) Pulse Rate:  [102-116] 103 (06/09 1212) Resp:  [21-29] 23 (06/09 1212) BP: (131-163)/(70-85) 133/75 (06/09 1212) SpO2:  [92 %-99 %] 99 % (06/09 1212) FiO2 (%):  [21 %] 21 % (06/09 1212) Weight:  [80.2 kg] 80.2 kg (06/09 0355)  General - Well nourished, well developed middle-aged African-American lady, s/p trach.  On trach collar  Ophthalmologic - fundi not visualized due to noncooperation.  Cardiovascular - Regular rhythm and rate  Neuro -   s/p trach on  trach collar., eyes open able to follow commands on the right hand and foot occasionally.. With forced eye opening, eyes in right forced gaze preference, not blinking to visual threat on the left, not tracking, PERRL. B/l eyelids mildly swollen. Corneal reflex present, gag and cough present.  Mild left lower facial asymmetry.  Tongue protrusion not cooperative. On pain stimulation, left UE and LE flaccid. Right UE proximal 2/5 and distal finger movement 2+/5 and RLE rigorously  movement in bed with stimulation. No babinski. Sensation, coordination not cooperative and gait not tested.   ASSESSMENT/PLAN [name] is a 45 y.o. female with history of uncontrolled HTN, tobacco use disorder, large uterine fibroids, heart failure, paroxysmal SVT, paranoid psychosis who presents with altered mental status after being found down.  CT head initially read as small acute appearing perforator infarct in the R BG and notable for severe small vessel ischemia. However concerning for potential R MCA stroke and thus CTA head and neck and CTP was obtained with a R MCA M1 occlusion and a core of 50cc and 77ml of penumbra.   Stroke:  right MCA stroke due to right M1 occlusion s/p IR with TICI 2b, complicated by rupture of right M3 requiring embolization, embolic pattern, source unclear CT head: Acute appearing perforator infarct at the right basal ganglia.  CTA neck: LVO right M1 origin. Left vertebral occlusion at its origin. High-grade narrowings of the right cavernous ICA. CT perfusion suggestive 50 cc core infarct and 50 cc of adjacent penumbra. MRI: large acute right MCA infarct. Associated petechial hemorrhage, subarachnoid hemorrhage, and minimal intraventricular hemorrhage. CT 5/28 stable right MCA infarct with MLS CT 5/30 stable right MCA infarct and MLS 46mm 2D Echo 55 to 60% Further cardioembolic work up once pt neuro improves significantly Hypercoagulable panel pending.  Homocystine is normal.   UDS: negative LDL 76 HgbA1c 5.4 UDS neg Sickle cell screen - neg, ANA neg VTE prophylaxis - SCDs No antithrombotic prior to admission, now on ASA 81. Therapy recommendations:  CIR Disposition:  Pending  Cytotoxic cerebral edema On 3% saline @ 30 -> FW 100 Q4. Stopped. Now on free water through [place] Na 157->156->157->155->157->153->150->150 >147 CT head 5/30 stable right MCA infarcts and MLS 80mm OK to gradually normalize Na level  Respiratory failure Intubated 5/26  for increased work of breathing with altered mental status  Management per CCM, appreciated Switched to precedex -> now off  Not candidate for extubation S/p trach 6/3 with CCM, Trach collar today  Acute blood loss anemia Large uterine fibroids hgb 7.8->5.9->PRBC->7.6->6.5->PRBC->7.2->6.7->PRBC->8.5->7.7->7.3->7.6 >7.5>9/1 PRBC tranfusion x 3 extensive virginal bleeding from extremely large abdominopelvic mass measuring up to 22.9 cm noted on CT 5/26 Has been followed with Dr. Jonna Clark - poor surgical candidate due to uncontrolled HTN and SVT Abdominal distention on exam Increase Megace to megace 80mg  tid OBGYN Dr. Rosana Hoes consulted, appreciate help - not surgical candidate  Dysphagia Likely due to stroke SLP following cortrak placement On TF @ 50 and FW  Paroxysmal SVT history Hx of CHF Followed with cardiology  EF 40-45% in 04/2020 and 20-25% in 12/2020 This admission EF 55-60% on amiodarone 200mg  daily and isosorbide 20 tid Currently heart rate controlled Consider TEE/Loop recorder prior to discahrge if able to tolerate  Hypertension Home meds:  Hydralazine 100mg  TID, Imdur 60mg  daily, Toprol XL 200mg  daily Off Cleviprex now Amlodipine 10mg  daily -> 5mg  bid clonidine 0.2 tid -> 0.1 Q8h Hydralazine 100mg  tid   imdur 20mg  tid -> Q8h Metoprolol 100mg  bid  SBP goal to <160 mmHg Labetalol prn Long-term BP goal normotensive  Hyperlipidemia Home meds:  none LDL 76, goal < 70 Atorvastatin 40mg  daily Continue statin at discharge  Tobacco abuse Current smoker Smoking cessation counseling will be provided  Fever and Leukocytosis WBC 12.7->14.0->23.1->20.9->20.6->19.1->16.8->15.4->18.1 >15.5>17.7  Afebrile for the past 24 h Sputum culture - staph Aures  Blood culture G+ cocci in clusters CXR LLL atx vs. pneumonia CCM on board On ancef   Other Stroke Risk Factors Coronary artery disease  Other Active Problems AKI: creatinine  1.27->1.48->1.54->1.51->1.52->1.75->1.39->1.43->1.45 >1.5>1.43 Lethargic - added provigil by CCM-DC on 05/30/2021 as patient is awake and blood pressure was elevated   hospital day #16 Patient seems to be breathing well on the trach collar.  Her white count seems to be coming down now even after being off the antibiotics..  She is still unable to swallow and needs his PEG tube as per speech therapist..  Discussed with Dr. Grandville Silos trauma team consulted for PEG tub.  Discontinue Provigil and Megace.  Follow-up hypercoagulable panel labs.  Discussed with Dr. Wynelle Cleveland.   Stroke team will sign off.  Kindly call for questions greater than 50% time during this 25-minute visit was spent in counseling and coordination of care stroke and dysphagia and respiratory failure and answering questions. Antony Contras, MD Stroke Neurology 05/31/2021 1:09 PM     To contact Stroke Continuity provider, please refer to [email or web address] After hours, contact General Neurology

## 2021-05-31 NOTE — Progress Notes (Signed)
PROGRESS NOTE    Holly Bradley   ILN:[id number]  DOB: [birth year]  DOA: 05/15/2021 PCP: Mitzi Hansen, MD   Brief Narrative:  Holly Bradley is a 45  Y/o with HTN, DM 2, large uterine fibroids, pSVT, HFpEF, paranoid psychosis who presented on 5/24 with confusion.  She was found to have a right MCA M1 occlusion and underwent a thrombectomy which was complicated by contrast extravasation and M3 coil embolization and revascularization. She developed a subarachnoid hemorrhage. Repeat imaging reveal a right basal ganglia infarct with a 3 mm right to left shift. She was started on hypertonic saline. On 5/26 she developed respiratory distress and was intubated. On 5/26 a head CT showed increased cytotoxic edema and a left sided shift with was 5 mm. She has not had improvement in mental status and now has a trach. She continues to require tube feeds. She has been treated for MSSA pneumonia with Ancef (course complted on 6/7)  Blood cultures on 5/30 noted to be positive for staph Epi. These were repeated and remain positive. Ancef has been resumed.    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 complicated by ruptured right M3 causing which was successfully embolized Dysphagia - currently awake but minimally communicative with trach and tube feeds - hypercoagulable w/u results pending - Family would like PEG tube- general surgery following with a plan for open G tube due to extensive fibroids and difficult anatomy - cont Lipitor and ASA 81 mg  Active Problems:   Acute respiratory failure with hypoxia  - continue Trach per PCCM- they plan to see Tues and Thurs for trach care  MSSA pneumonia - 1 wk of antibiotics completed on 6/6  Staph epidermidis bacteremia, fever, leucocytosis> sepsis - noted on blood cultures from 5/30 and again on repeat culture from 6/6 -  restarted Ancef 6/8 -  based on  sensitivities, culture are growing 2 different types of staph epi (one is a MRSE) and also staph hominis - temps 100.3 today so MRSE may be the actual organism and may need Vanc - I have asked for an ID opinion - she is tachycardic, has tachypnea- Lactic acid is normal     Fibroids - extremely large fibroids palpable on exam - she had some vaginal bleeding & OB/GYN evaluated her on 3/31 and recommended Megace 80 mg TID to stop the bleeding (on a lower dose a outpt)- also stated that she is currently not a surgical candidate - I spoke with Dr Elly Modena and she recommended to continue Megace- A fibroidectomy can be revisited in the future     Iron deficiency anemia due to chronic blood loss - Hb ~ 7- 8 - resume oral Iron     Chronic systolic and diastolic CHF - ECHO on 1/56 reveals that EF has improved to normal and she has grade 2 d CHF - cont Entresto, Imdur and Coreg  - Hydralazine and Demadex on hold      H/o PSVT - cont Amiodarone (home med)  HTN - cont Coreg, Amlodipine, Hydralazine, Isosorbide  Tobacco abuse - prior to admission  Thrombocytosis - ? Acute phase reactant in setting of infection- follow  Time spent in minutes: 35 DVT prophylaxis: SCD's Start: 05/15/21 0753 Code Status: no CPR Family Communication:  Level of Care: Level of care: Med-Surg Disposition Plan:  Status is: Inpatient  Remains inpatient appropriate because:IV treatments appropriate due to intensity  of illness or inability to take PO   Dispo: The patient is from: Home              Anticipated d/c is to:  TBD              Patient currently is not medically stable to d/c.   Difficult to place patient Yes   Consultants:  Neurology PCCM Palliative care IR Procedures:  thrombectomy Antimicrobials:  Anti-infectives (From admission, onward)    Start     Dose/Rate Route Frequency Ordered Stop   05/30/21 1515  ceFAZolin (ANCEF) IVPB 2g/100 mL premix  Status:  Discontinued        2 g 200  mL/hr over 30 Minutes Intravenous Every 8 hours 05/30/21 1415 05/31/21 1404   05/21/21 1100  ceFAZolin (ANCEF) IVPB 2g/100 mL premix        2 g 200 mL/hr over 30 Minutes Intravenous Every 8 hours 05/21/21 1030 05/28/21 0537        Objective: Vitals:   05/31/21 0852 05/31/21 0905 05/31/21 1132 05/31/21 1212  BP: (!) 142/80 (!) 151/81 (!) 157/85 133/75  Pulse: (!) 111 (!) 110 (!) 110 (!) 103  Resp: (!) 27 (!) 23 (!) 29 (!) 23  Temp:  99 F (37.2 C) 100.3 F (37.9 C) 100.3 F (37.9 C)  TempSrc:   Axillary   SpO2: 98% 99% 92% 99%  Weight:      Height:        Intake/Output Summary (Last 24 hours) at 05/31/2021 1447 Last data filed at 05/31/2021 0400 Gross per 24 hour  Intake 2695.94 ml  Output 1000 ml  Net 1695.94 ml    Filed Weights   05/29/21 2151 05/30/21 0322 05/31/21 0355  Weight: 74 kg 74 kg 80.2 kg    Examination: General exam: Appears comfortable  HEENT: PERRLA, oral mucosa moist, no sclera icterus or thrush- trach in place Respiratory system: Clear to auscultation. Respiratory effort normal. Cardiovascular system: S1 & S2 heard, regular rate and rhythm Gastrointestinal system: Abdomen soft, non-tender, nondistended. Normal bowel sounds   Central nervous system: Alert - dense left hemiplegia- follows commands- no speaking currently Extremities: No cyanosis, clubbing - edema of hands noted- asked to keep right hand elevated Skin: No rashes or ulcers        Data Reviewed: I have personally reviewed following labs and imaging studies  CBC: Recent Labs  Lab 05/27/21 0314 05/28/21 0430 05/29/21 0254 05/30/21 1041 05/31/21 0314  WBC 17.7* 19.4* 17.1* 13.2* 13.1*  NEUTROABS  --   --   --   --  11.1*  HGB 9.1* 10.0* 8.8* 7.9* 8.0*  HCT 31.7* 35.2* 30.9* 27.4* 27.2*  MCV 76.0* 77.2* 75.9* 76.3* 76.6*  PLT 392 427* 460* 501* 533*    Basic Metabolic Panel: Recent Labs  Lab 05/27/21 0314 05/28/21 0430 05/29/21 0254 05/30/21 1041 05/31/21 0314  NA 142  140 137 139 137  K 4.2 5.3* 4.4 4.7 4.6  CL 113* 109 108 111 107  CO2 22 21* 21* 22 20*  GLUCOSE 111* 101* 112* 118* 113*  BUN 27* 26* 25* 20 19  CREATININE 1.43* 1.30* 1.18* 1.17* 1.12*  CALCIUM 8.6* 9.1 9.3 9.0 9.0    GFR: Estimated Creatinine Clearance: 65 mL/min (A) (by C-G formula based on SCr of 1.12 mg/dL (H)). Liver Function Tests: No results for input(s): AST, ALT, ALKPHOS, BILITOT, PROT, ALBUMIN in the last 168 hours. No results for input(s): LIPASE, AMYLASE in the last 168 hours. No  results for input(s): AMMONIA in the last 168 hours. Coagulation Profile: No results for input(s): INR, PROTIME in the last 168 hours. Cardiac Enzymes: No results for input(s): CKTOTAL, CKMB, CKMBINDEX, TROPONINI in the last 168 hours. BNP (last 3 results) No results for input(s): PROBNP in the last 8760 hours. HbA1C: No results for input(s): HGBA1C in the last 72 hours. CBG: Recent Labs  Lab 05/30/21 2356 05/31/21 0404 05/31/21 0525 05/31/21 0759 05/31/21 1215  GLUCAP 116* 101* 109* 128* 114*    Lipid Profile: No results for input(s): CHOL, HDL, LDLCALC, TRIG, CHOLHDL, LDLDIRECT in the last 72 hours. Thyroid Function Tests: No results for input(s): TSH, T4TOTAL, FREET4, T3FREE, THYROIDAB in the last 72 hours. Anemia Panel: No results for input(s): VITAMINB12, FOLATE, FERRITIN, TIBC, IRON, RETICCTPCT in the last 72 hours. Urine analysis:    Component Value Date/Time   COLORURINE STRAW (A) 05/15/2021 0445   APPEARANCEUR CLEAR 05/15/2021 0445   LABSPEC 1.005 05/15/2021 0445   PHURINE 7.0 05/15/2021 0445   GLUCOSEU NEGATIVE 05/15/2021 0445   HGBUR SMALL (A) 05/15/2021 0445   BILIRUBINUR NEGATIVE 05/15/2021 0445   KETONESUR NEGATIVE 05/15/2021 0445   PROTEINUR 100 (A) 05/15/2021 0445   NITRITE NEGATIVE 05/15/2021 0445   LEUKOCYTESUR NEGATIVE 05/15/2021 0445   Sepsis Labs: _0 (procalcitonin:4,lacticidven:4) ) Recent Results (from the past 240 hour(s))  Culture,  Respiratory w Gram Stain     Status: None   Collection Time: 05/28/21  7:43 AM   Specimen: Tracheal Aspirate; Respiratory  Result Value Ref Range Status   Specimen Description TRACHEAL ASPIRATE  Final   Special Requests NONE  Final   Gram Stain   Final    FEW WBC PRESENT,BOTH PMN AND MONONUCLEAR RARE GRAM POSITIVE COCCI IN PAIRS    Culture   Final    Normal respiratory flora-no Staph aureus or Pseudomonas seen Performed at [facility], [address] [place], [place] [address]    Report Status 05/30/2021 FINAL  Final  Culture, blood (routine x 2)     Status: Abnormal   Collection Time: 05/28/21  8:27 AM   Specimen: BLOOD  Result Value Ref Range Status   Specimen Description BLOOD RIGHT ANTECUBITAL  Final   Special Requests   Final    BOTTLES DRAWN AEROBIC AND ANAEROBIC Blood Culture adequate volume   Culture  Setup Time   Final    GRAM POSITIVE COCCI IN CLUSTERS AEROBIC BOTTLE ONLY CRITICAL VALUE NOTED.  VALUE IS CONSISTENT WITH PREVIOUSLY REPORTED AND CALLED VALUE. Performed at [facility], [place] [address], [place], [place] [address]    Culture STAPHYLOCOCCUS EPIDERMIDIS (A)  Final   Report Status 05/31/2021 FINAL  Final   Organism ID, Bacteria STAPHYLOCOCCUS EPIDERMIDIS  Final      Susceptibility   Staphylococcus epidermidis - MIC*    CIPROFLOXACIN >=8 RESISTANT Resistant     ERYTHROMYCIN >=8 RESISTANT Resistant     GENTAMICIN <=0.5 SENSITIVE Sensitive     OXACILLIN >=4 RESISTANT Resistant     TETRACYCLINE 2 SENSITIVE Sensitive     VANCOMYCIN <=0.5 SENSITIVE Sensitive     TRIMETH/SULFA 80 RESISTANT Resistant     CLINDAMYCIN >=8 RESISTANT Resistant     RIFAMPIN <=0.5 SENSITIVE Sensitive     Inducible Clindamycin NEGATIVE Sensitive     * STAPHYLOCOCCUS EPIDERMIDIS  Culture, blood (routine x 2)     Status: Abnormal (Preliminary result)   Collection Time: 05/28/21  8:28 AM   Specimen: BLOOD  Result Value Ref Range Status  Specimen Description BLOOD RIGHT  ANTECUBITAL  Final   Special Requests   Final    BOTTLES DRAWN AEROBIC AND ANAEROBIC Blood Culture adequate volume   Culture  Setup Time   Final    GRAM POSITIVE COCCI IN CLUSTERS AEROBIC BOTTLE ONLY CRITICAL VALUE NOTED.  VALUE IS CONSISTENT WITH PREVIOUSLY REPORTED AND CALLED VALUE.    Culture (A)  Final    STAPHYLOCOCCUS EPIDERMIDIS CULTURE REINCUBATED FOR BETTER GROWTH Performed at [facility], [place] [address], [place], [place] [address]    Report Status PENDING  Incomplete         Radiology Studies: DG Chest Port 1 View  Result Date: 05/30/2021 CLINICAL DATA:  Fever. EXAM: PORTABLE CHEST 1 VIEW COMPARISON:  05/25/2021. FINDINGS: Tracheostomy tube and feeding tube in stable position. Cardiomegaly. No pulmonary venous congestion. Left lower lobe atelectasis/consolidation again noted. Small left pleural effusion. No pneumothorax. IMPRESSION: 1.  Tracheostomy tube and feeding tube in stable position. 2.  Cardiomegaly.  No pulmonary venous congestion. 3. Left lower lobe atelectasis/consolidation again noted. Small left pleural effusion. Electronically Signed   By: Marcello Moores  Register   On: 05/30/2021 09:14   DG Abd Portable 1V  Result Date: 05/30/2021 CLINICAL DATA:  Ileus. EXAM: PORTABLE ABDOMEN - 1 VIEW COMPARISON:  05/26/2021. FINDINGS: Feeding tube noted with tip over the stomach. No bowel distention. No free air. Large abdominopelvic mass again noted. Reference is made to CT report of 05/17/2021. IMPRESSION: 1. Feeding tube noted with tip over the stomach. No bowel distention. 2. Large abdominopelvic mass again noted. Reference is made to CT report of 05/17/2021. Electronically Signed   By: Marcello Moores  Register   On: 05/30/2021 12:46       Scheduled Meds:  amiodarone  200 mg Per Tube Daily   amLODipine  10 mg Per Tube Daily   aspirin  81 mg Per Tube Daily   atorvastatin  40 mg Per Tube Daily   carvedilol  25 mg Per Tube BID WC   chlorhexidine gluconate (MEDLINE KIT)  15 mL  Mouth Rinse BID   Chlorhexidine Gluconate Cloth  6 each Topical Daily   feeding supplement (PROSource TF)  45 mL Per Tube BID   ferrous sulfate  325 mg Oral BID   free water  200 mL Per Tube Q6H   hydrALAZINE  100 mg Per Tube Q8H   isosorbide dinitrate  40 mg Per Tube TID   mouth rinse  15 mL Mouth Rinse 10 times per day   multivitamin with minerals  1 tablet Per Tube Daily   pantoprazole sodium  40 mg Per Tube Daily   sacubitril-valsartan  1 tablet Per Tube BID   Continuous Infusions:  feeding supplement (OSMOLITE 1.5 CAL) 1,000 mL (05/30/21 2043)     LOS: 16 days      Debbe Odea, MD [facility] Pager: [email or web address] 05/31/2021, 2:47 PM

## 2021-05-31 NOTE — Progress Notes (Signed)
Occupational Therapy Treatment Patient Details Name: Holly Bradley MRN: [id number] DOB: 03/16/1976 Today's Date: 05/31/2021    History of present illness 45 yo female s/p mechanical thrombectomy on 5/24 of right MCA M1/M2/M3 and embolization of right MCA M3 branch due to contrast extravasation. Pt re-intubated 5/26. Repeat head CT on 5/26 showed increased cytotoxic edema and mildly increased leftward midline shift, now 5 mm, decreased SAH and IVH. Abdominal CT showing 22.9 cm mass, possibly consistent with uterine fibroids, no lymphadenopathy. s/p trach 6/3. PMH fibriods HTN HF paranoid psychosis recent hospitalizations january and february 2022   OT comments  Upon arrival, pt supine in bed with lateral lean to L into rolled up blanket. Pt greeting therapist's with a wave and making eye contact once therapist in visual field. Difficulty with tracking L. Pt continues to present with decreased balance, cognition, strength, and activity tolerance. Pt with bowel and urine incontinence in bed; Total A +2 for peri care and rolling in bed. Pt tolerating sitting at EOB with Max A for support to don new gown and participate in lateral  leaning exercises. Continue to recommend dc to SNF and will continue to follow acutely as admitted.    Follow Up Recommendations  SNF (pending further progression)    Equipment Recommendations  Wheelchair (measurements OT);Wheelchair cushion (measurements OT);Hospital bed;3 in 1 bedside commode    Recommendations for Other Services Speech consult    Precautions / Restrictions Precautions Precautions: Fall Precaution Comments: L hemiparesis, PRAFO, hand splint,  trach, SBP < 160, cortrak       Mobility Bed Mobility Overal bed mobility: Needs Assistance Bed Mobility: Rolling;Sidelying to Sit;Sit to Supine Rolling: Total assist;+2 for physical assistance Sidelying to sit: Total assist;+2 for physical assistance   Sit to supine: Total assist;+2 for physical  assistance   General bed mobility comments: Total +2 for rolling bilaterally, trunk and LE management, and scooting to/from EOB.    Transfers                 General transfer comment: Defer due to fatigue after peri care    Balance Overall balance assessment: Needs assistance Sitting-balance support: Single extremity supported;Feet supported Sitting balance-Leahy Scale: Zero Sitting balance - Comments: max posterior assist to maintain upright sitting, EOB sitting x10 minutes with elbow propping bilat, assisting with gown change and EKG change. Cues for placement of RUE in pt lap to prevent pushing L Postural control: Posterior lean;Left lateral lean                                 ADL either performed or assessed with clinical judgement   ADL Overall ADL's : Needs assistance/impaired Eating/Feeding: NPO   Grooming: Wash/dry face;Oral care;Sitting;Total assistance           Upper Body Dressing : Maximal assistance Upper Body Dressing Details (indicate cue type and reason): Pt reaching RUE forward into gown sleeve within visual field. Simple direct cues to sequence such as "lift your right hand. reach forward." Pt fatigues quickly         Toileting- Clothing Manipulation and Hygiene: Total assistance;+2 for physical assistance;Bed level Toileting - Clothing Manipulation Details (indicate cue type and reason): Total A +2 for peri care and rolling       General ADL Comments: total (A) for all care. attempting to wipe face with wash cloth appropriately and nose with drainage     Vision   Vision Assessment?: Yes  Eye Alignment: Impaired (comment) Alignment/Gaze Preference: Gaze right;Head turned;Chin down Tracking/Visual Pursuits: Requires cues, head turns, or add eye shifts to track;Unable to hold eye position out of midline;Impaired - to be further tested in functional context Additional Comments: R gaze and difficulty tracking to L.   Perception      Praxis      Cognition Arousal/Alertness: Awake/alert Behavior During Therapy: Flat affect Overall Cognitive Status: Difficult to assess Area of Impairment: Following commands;Attention;Safety/judgement;Awareness;Problem solving                       Following Commands: Follows one step commands with increased time Safety/Judgement: Decreased awareness of deficits;Decreased awareness of safety Awareness: Intellectual Problem Solving: Slow processing;Decreased initiation;Difficulty sequencing;Requires verbal cues;Requires tactile cues General Comments: Pt nodding yes/no appropriately during session, gesturing she is hot throughout session. Pt with consistent anterior loss of saliva out of mouth, with no attempt to correct. Requiring signficant increased time for following commands.        Exercises Exercises: Other exercises;General Upper Extremity General Exercises - Upper Extremity Shoulder Flexion: PROM;Left;15 reps Elbow Flexion: PROM;Both;Supine;15 reps Elbow Extension: PROM;Both;Supine;15 reps Wrist Extension: PROM;Left;15 reps;Supine Digit Composite Flexion: PROM;Both;Supine;15 reps Composite Extension: PROM;Left;15 reps;Supine Other Exercises Other Exercises: lateral leaning to L and R elbows. x2 Other Exercises: splint check; no redness   Shoulder Instructions       General Comments Pt with rectal pouch apparatus off and pt heavily soiled in stool and urine, RN brought to room to assist with clean up and to assess rectal pouch and skin integrity    Pertinent Vitals/ Pain       Pain Assessment: Faces Faces Pain Scale: Hurts little more Pain Location: generalized discomfort Pain Descriptors / Indicators: Grimacing;Discomfort;Other (Comment) (gesturing "hot") Pain Intervention(s): Monitored during session;Limited activity within patient's tolerance;Repositioned  Home Living                                          Prior  Functioning/Environment              Frequency  Min 2X/week        Progress Toward Goals  OT Goals(current goals can now be found in the care plan section)  Progress towards OT goals: Progressing toward goals  Acute Rehab OT Goals Patient Stated Goal: none stated OT Goal Formulation: Patient unable to participate in goal setting Time For Goal Achievement: 06/12/21 Potential to Achieve Goals: Good ADL Goals Pt Will Perform Grooming: with min guard assist;sitting Additional ADL Goal #1: pt will static sit with mod (A) as precursor to adls Additional ADL Goal #2: pt will tolerate resting hand splints 4 hrs on and 4 hrs off Additional ADL Goal #3: pt will complete basic transfer total +2 mod (A) as precursor to adls  Plan Discharge plan remains appropriate    Co-evaluation    PT/OT/SLP Co-Evaluation/Treatment: Yes Reason for Co-Treatment: For patient/therapist safety;To address functional/ADL transfers PT goals addressed during session: Mobility/safety with mobility;Balance OT goals addressed during session: ADL's and self-care      AM-PAC OT "6 Clicks" Daily Activity     Outcome Measure   Help from another person eating meals?: Total Help from another person taking care of personal grooming?: A Lot Help from another person toileting, which includes using toliet, bedpan, or urinal?: Total Help from another person bathing (including washing, rinsing, drying)?: Total Help from  another person to put on and taking off regular upper body clothing?: A Lot Help from another person to put on and taking off regular lower body clothing?: Total 6 Click Score: 8    End of Session Equipment Utilized During Treatment: Oxygen Lurline Idol)  OT Visit Diagnosis: Unsteadiness on feet (R26.81);Muscle weakness (generalized) (M62.81);Hemiplegia and hemiparesis Hemiplegia - Right/Left: Left Hemiplegia - dominant/non-dominant: Non-Dominant Hemiplegia - caused by: Cerebral infarction    Activity Tolerance Patient tolerated treatment well   Patient Left in bed;with call bell/phone within reach;with bed alarm set;Other (comment)   Nurse Communication Precautions;Mobility status;Need for lift equipment        Time: 5670-1410 OT Time Calculation (min): 46 min  Charges: OT General Charges $OT Visit: 1 Visit OT Treatments $Self Care/Home Management : 23-37 mins  [place], OTR/L Acute Rehab Pager: [phone] Office: [place] 05/31/2021, 4:47 PM

## 2021-05-31 NOTE — Progress Notes (Signed)
NAME:  Holly Bradley, MRN:  [id number], DOB:  06-22-1976, LOS: 91  ADMISSION DATE:  05/15/2021, CONSULTATION DATE:  05/31/21 REFERRING MD:  Leonie Man, CHIEF COMPLAINT:  Respiratory failure   History of Present Illness:  Holly Bradley is a [name] with PMH of HTN, tobacco use, HFpF, DMT2 (controlled with diet and exercise), pSVT, uterine fibroids, paranoid psychosis who presented with AMS on 5/24.   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  On follow up imaging, she had stable appearing SAH and continued R basal ganglia infarct with 23mm R to L shift.   She was admitted to the Neuro ICU and treating with hypertonic saline, statin and BP control.  On 5/26, PCCM consulted when pt became acutely less responsive with respiratory distress along with abdominal distension worse than baseline, per patient.  She was intubated and repeat head CT, abd/pelvis CT ordered.  Repeat head CT on 5/26 showed increased cytotoxic edema and mildly increased leftward midline shift, now 5 mm, decreased SAH and IVH.  Abdominal CT showing 22.9 cm mass, possibly consistent with uterine fibroids, no lymphadenopathy.      Significant Hospital Events: Including procedures, antibiotic start and stop dates in addition to other pertinent events   5/24 Admit to neurology after ED visit with AMS, found to have R MCA stroke, underwent mechanical thrombectomy, M3 coil embolization, subsequent SAH 5/26 decreased mental status and respiratory distress, intubated  6/1 still not candidate for extubation with poor mentation and cough mechanics. Family unsure if pt would want trach. Staph epi bacteremia, continues on ancef  6/2 moving RUE RLE following some commands. decision to move forward with tracheostomy.   6/3 perc trach. Slight WBC increase but no  fever, continues on ancef  6/4 Tolerating PSV/CPAP  6/5 WBC up to 17, last day of ancef for staph pna. Trying ATC   Interim History / Subjective:  No distress. NA  Objective   Blood pressure (Abnormal) 151/81, pulse (Abnormal) 110, temperature 99 F (37.2 C), resp. rate (Abnormal) 23, height 5\' 5"  (1.651 m), weight 80.2 kg, SpO2 99 %.    FiO2 (%):  [21 %] 21 %   Intake/Output Summary (Last 24 hours) at 05/31/2021 0951 Last data filed at 05/31/2021 0400 Gross per 24 hour  Intake 2695.94 ml  Output 1700 ml  Net 995.94 ml   Filed Weights   05/29/21 2151 05/30/21 0322 05/31/21 0355  Weight: 74 kg 74 kg 80.2 kg   General this is a 45 year old aaf resting in bed. She does not appear to be in acute distress.  HENT NCAT no JVD trach site unremarkable. Sutures are intact Pulm diffuse rhonchi. Thick secretions  Card rrr Abd soft not tender Ext warm and dry  Neuro left sided hemiparesis f/c on right   Labs/imaging that I havepersonally reviewed  (right click and "Reselect all SmartList Selections" daily)  See below   Assessment & Plan:   Acute respiratory failure with hypoxia S/p tracheostomy MSSA pneumonia (completed rx) Acute encephalopathy: Multifactorial, in setting of intracranial process below, delirium, infection/metabolic abnormalities  R MCA CVA complicated with with SAH and midline shift, cerebral edema  Staph epidermis bacteremia  Sepsis due to staph a. 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 No distress. Has sig oral secretions. Doubt a candidate for decannulation in near future but will see how she does.   Plan Cont routine trach care Sutures can be removed 6/10 Cont humidified trach collar Will plan on changing to cuffless trach next Tuesday 6/14 if no issues  All other concerns per primary    Best Practice (right click and "Reselect all SmartList Selections" daily)   Diet/type: tubefeeds Pain/Anxiety/Delirium protocol Not indicated VAP protocol (if indicated): Not indicated DVT prophylaxis: SCD GI prophylaxis: PPI Glucose control:  not indicated Central venous access:  N/A Arterial line:  N/A Foley:  N/A Mobility:  OOB  PT consulted: Yes Studies pending: None Culture data pending:none Last reviewed culture data:today Antibiotics:ancef Antibiotic de-escalation: no,  continue current rx Stop date: to be determined  Daily labs:  per primary  Code Status:  limited Last date of multidisciplinary goals of care discussion [per primary ]  We will see Tuesday and thursdays for trach care.   Erick Colace ACNP-BC [place] Pager # [phone] OR # [phone] if no answer

## 2021-05-31 NOTE — Progress Notes (Signed)
  Speech Language Pathology Treatment:    Patient Details Name: Holly Bradley MRN: [id number] DOB: 01-Dec-1976 Today's Date: 05/31/2021 Time: 8786-7672 SLP Time Calculation (min) (ACUTE ONLY): 15 min  Assessment / Plan / Recommendation Clinical Impression  Pt seen for continued PMSV trials on trach collar with deflated cuff at baseline. Pt was able to open eyes and nod yes to simple questions however with head leaning to the left and poor management of saliva with drooling necessitating oral suction. Trialed PMSV after RN tracheal suction. Following placement pt with increased work of breathing and exhibiting discomfort. No phonation exhibited. Removed PMSV within 10 seconds, significant air trapping concerning for poor airway patency. Per chart review, if pt tolerates trach, plans for trach change to cuffless trach 6/14. Will continue to follow.    HPI HPI: Pt is a [name] who presents with AMS and slurred speech after being found down. MRI showing moderately large acute right MCA infarct with associated petechial hemorrhage, SAH, and minimal IVH. S/p mechanical thrombectomy of M1/M2/M3 and embolization of right MCA M3 branch. Significant PMH: uncontrolled HTN, tobacco use disorder, HF, paroxysmal SVT, paranoid psychosis. Required intubation s/p trach 6/3. Pt pending PEG however large uterine mass disrupting planned placement.      SLP Plan     Patient needs continued Speech Lanaguage Pathology Services    Recommendations         Patient may use Passy-Muir Speech Valve: with SLP only PMSV Supervision: Full MD: Please consider changing trach tube to : Cuffless         SLP Visit Diagnosis: Aphonia (R49.1)       [place], CCC-SLP Acute Rehabilitation Services   05/31/2021, 12:40 PM

## 2021-06-01 LAB — URINALYSIS, ROUTINE W REFLEX MICROSCOPIC
Bilirubin Urine: NEGATIVE
Glucose, UA: NEGATIVE mg/dL
Ketones, ur: NEGATIVE mg/dL
Nitrite: NEGATIVE
Protein, ur: 30 mg/dL — AB
Specific Gravity, Urine: 1.012 (ref 1.005–1.030)
pH: 7 (ref 5.0–8.0)

## 2021-06-01 LAB — GLUCOSE, CAPILLARY
Glucose-Capillary: 107 mg/dL — ABNORMAL HIGH (ref 70–99)
Glucose-Capillary: 107 mg/dL — ABNORMAL HIGH (ref 70–99)
Glucose-Capillary: 108 mg/dL — ABNORMAL HIGH (ref 70–99)
Glucose-Capillary: 111 mg/dL — ABNORMAL HIGH (ref 70–99)
Glucose-Capillary: 117 mg/dL — ABNORMAL HIGH (ref 70–99)
Glucose-Capillary: 89 mg/dL (ref 70–99)

## 2021-06-01 LAB — BASIC METABOLIC PANEL
Anion gap: 8 (ref 5–15)
BUN: 18 mg/dL (ref 6–20)
CO2: 20 mmol/L — ABNORMAL LOW (ref 22–32)
Calcium: 9.1 mg/dL (ref 8.9–10.3)
Chloride: 107 mmol/L (ref 98–111)
Creatinine, Ser: 1.04 mg/dL — ABNORMAL HIGH (ref 0.44–1.00)
GFR, Estimated: >60 mL/min (ref >60)
Glucose, Bld: 110 mg/dL — ABNORMAL HIGH (ref 70–99)
Potassium: 4.6 mmol/L (ref 3.5–5.1)
Sodium: 135 mmol/L (ref 135–145)

## 2021-06-01 LAB — CBC
HCT: 26.9 % — ABNORMAL LOW (ref 36.0–46.0)
Hemoglobin: 7.8 g/dL — ABNORMAL LOW (ref 12.0–15.0)
MCH: 22.5 pg — ABNORMAL LOW (ref 26.0–34.0)
MCHC: 29 g/dL — ABNORMAL LOW (ref 30.0–36.0)
MCV: 77.5 fL — ABNORMAL LOW (ref 80.0–100.0)
Platelets: 577 10*3/uL — ABNORMAL HIGH (ref 150–400)
RBC: 3.47 MIL/uL — ABNORMAL LOW (ref 3.87–5.11)
RDW: 29.5 % — ABNORMAL HIGH (ref 11.5–15.5)
WBC: 11.4 10*3/uL — ABNORMAL HIGH (ref 4.0–10.5)
nRBC: 0.3 % — ABNORMAL HIGH (ref 0.0–0.2)

## 2021-06-01 LAB — HEMOGLOBIN AND HEMATOCRIT, BLOOD
HCT: 28.3 % — ABNORMAL LOW (ref 36.0–46.0)
Hemoglobin: 8.1 g/dL — ABNORMAL LOW (ref 12.0–15.0)

## 2021-06-01 LAB — PROTHROMBIN GENE MUTATION

## 2021-06-01 MED ORDER — CLONIDINE HCL 0.1 MG PO TABS
0.1000 mg | ORAL_TABLET | Freq: Three times a day (TID) | ORAL | Status: DC
  Administered 2021-06-01: 0.1 mg via ORAL
  Filled 2021-06-01: qty 1

## 2021-06-01 MED ORDER — HYDRALAZINE HCL 100 MG PO TABS: 100.0000 mg | ORAL_TABLET | Freq: Three times a day (TID) | ORAL | Status: DC

## 2021-06-01 MED ORDER — CLONIDINE HCL 0.1 MG PO TABS
0.1000 mg | ORAL_TABLET | Freq: Three times a day (TID) | ORAL | Status: DC
  Administered 2021-06-01 – 2021-06-21 (×60): 0.1 mg
  Filled 2021-06-01 (×60): qty 1

## 2021-06-01 NOTE — Progress Notes (Signed)
[facility]  Date of Admission:  05/15/2021     Total days of antibiotics 8         ASSESSMENT:  Ms. Canty continues to remain afebrile with improving WBC count. Clinically she appears better today and is more alert and interactive. Blood cultures awaiting sensitivities. Continue to monitor off antibiotics at this point and await sensitivity results, although it appears to be favoring contamination at this point.   PLAN:  Continue to monitor off antibiotics.  Remaining care per primary team.   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 (HCC) Active Problems:   Acute respiratory failure with hypoxia (HCC)   Fibroids   Iron deficiency anemia due to chronic blood loss   Cerebral edema (HCC)   Chronic HFrEF (heart failure with reduced ejection fraction) (HCC)   Pneumonia due to methicillin susceptible Staphylococcus aureus (HCC)    amiodarone  200 mg Per Tube Daily   amLODipine  10 mg Per Tube Daily   aspirin  81 mg Per Tube Daily   atorvastatin  40 mg Per Tube Daily   carvedilol  25 mg Per Tube BID WC   chlorhexidine gluconate (MEDLINE KIT)  15 mL Mouth Rinse BID   Chlorhexidine Gluconate Cloth  6 each Topical Daily   cloNIDine  0.1 mg Oral TID   feeding supplement (PROSource TF)  45 mL Per Tube BID   ferrous sulfate  300 mg Per Tube BID WC   free water  200 mL Per Tube Q6H   hydrALAZINE  100 mg Per Tube Q8H   isosorbide dinitrate  40 mg Per Tube TID   mouth rinse  15 mL Mouth Rinse 10 times per day   megestrol  80 mg Per Tube TID   multivitamin with minerals  1 tablet Per Tube Daily   pantoprazole sodium  40 mg Per Tube Daily   sacubitril-valsartan  1 tablet Per Tube BID    SUBJECTIVE:  Afebrile overnight with no acute events. Denies pain/fever/chills.  No Known Allergies   Review of Systems: Review of Systems  Constitutional:  Negative for chills, fever and weight loss.  Respiratory:  Negative for  cough, shortness of breath and wheezing.   Cardiovascular:  Negative for chest pain and leg swelling.  Gastrointestinal:  Negative for abdominal pain, constipation, diarrhea, nausea and vomiting.  Skin:  Negative for rash.     OBJECTIVE: Vitals:   05/31/21 2306 06/01/21 0319 06/01/21 0411 06/01/21 0810  BP:   (!) 163/83 (!) 167/76  Pulse: (!) 102 (!) 102 (!) 110 (!) 107  Resp: (!) 25 (!) 21 (!) 25 (!) 23  Temp:   99.6 F (37.6 C) 99.8 F (37.7 C)  TempSrc:   Oral Oral  SpO2: 94% 96% 99% 99%  Weight:      Height:       Body mass index is 28.1 kg/m.  Physical Exam Constitutional:      General: She is not in acute distress.    Appearance: She is well-developed.     Comments: Lying in bed with head of bed elevated; able to answer yes/no questions appropriately and follow commands on right side.   HENT:     Nose:     Comments: Cortrack in place. Cardiovascular:     Rate and Rhythm: Normal rate and regular rhythm.     Heart sounds: Normal heart sounds.  Pulmonary:     Effort: Pulmonary effort is normal.  Breath sounds: Normal breath sounds.  Skin:    General: Skin is warm and dry.  Neurological:     Mental Status: She is alert.    Lab Results Lab Results  Component Value Date   WBC 11.4 (H) 06/01/2021   HGB 8.1 (L) 06/01/2021   HCT 28.3 (L) 06/01/2021   MCV 77.5 (L) 06/01/2021   PLT 577 (H) 06/01/2021    Lab Results  Component Value Date   CREATININE 1.04 (H) 06/01/2021   BUN 18 06/01/2021   NA 135 06/01/2021   K 4.6 06/01/2021   CL 107 06/01/2021   CO2 20 (L) 06/01/2021    Lab Results  Component Value Date   ALT 19 05/15/2021   AST 26 05/15/2021   ALKPHOS 88 05/15/2021   BILITOT 1.0 05/15/2021     Microbiology: Recent Results (from the past 240 hour(s))  Culture, Respiratory w Gram Stain     Status: None   Collection Time: 05/28/21  7:43 AM   Specimen: Tracheal Aspirate; Respiratory  Result Value Ref Range Status   Specimen Description  TRACHEAL ASPIRATE  Final   Special Requests NONE  Final   Gram Stain   Final    FEW WBC PRESENT,BOTH PMN AND MONONUCLEAR RARE GRAM POSITIVE COCCI IN PAIRS    Culture   Final    Normal respiratory flora-no Staph aureus or Pseudomonas seen Performed at [facility], [address], [place], [place] [address]    Report Status 05/30/2021 FINAL  Final  Culture, blood (routine x 2)     Status: Abnormal   Collection Time: 05/28/21  8:27 AM   Specimen: BLOOD  Result Value Ref Range Status   Specimen Description BLOOD RIGHT ANTECUBITAL  Final   Special Requests   Final    BOTTLES DRAWN AEROBIC AND ANAEROBIC Blood Culture adequate volume   Culture  Setup Time   Final    GRAM POSITIVE COCCI IN CLUSTERS AEROBIC BOTTLE ONLY CRITICAL VALUE NOTED.  VALUE IS CONSISTENT WITH PREVIOUSLY REPORTED AND CALLED VALUE. Performed at [facility], [place] [address], [place], [place] [address]    Culture STAPHYLOCOCCUS EPIDERMIDIS (A)  Final   Report Status 05/31/2021 FINAL  Final   Organism ID, Bacteria STAPHYLOCOCCUS EPIDERMIDIS  Final      Susceptibility   Staphylococcus epidermidis - MIC*    CIPROFLOXACIN >=8 RESISTANT Resistant     ERYTHROMYCIN >=8 RESISTANT Resistant     GENTAMICIN <=0.5 SENSITIVE Sensitive     OXACILLIN >=4 RESISTANT Resistant     TETRACYCLINE 2 SENSITIVE Sensitive     VANCOMYCIN <=0.5 SENSITIVE Sensitive     TRIMETH/SULFA 80 RESISTANT Resistant     CLINDAMYCIN >=8 RESISTANT Resistant     RIFAMPIN <=0.5 SENSITIVE Sensitive     Inducible Clindamycin NEGATIVE Sensitive     * STAPHYLOCOCCUS EPIDERMIDIS  Culture, blood (routine x 2)     Status: Abnormal (Preliminary result)   Collection Time: 05/28/21  8:28 AM   Specimen: BLOOD  Result Value Ref Range Status   Specimen Description BLOOD RIGHT ANTECUBITAL  Final   Special Requests   Final    BOTTLES DRAWN AEROBIC AND ANAEROBIC Blood Culture adequate volume   Culture  Setup Time   Final    GRAM POSITIVE COCCI IN  CLUSTERS AEROBIC BOTTLE ONLY CRITICAL VALUE NOTED.  VALUE IS CONSISTENT WITH PREVIOUSLY REPORTED AND CALLED VALUE.    Culture (A)  Final    STAPHYLOCOCCUS EPIDERMIDIS SUSCEPTIBILITIES TO FOLLOW Performed at [facility]  [facility], [place] [address] [place], [place] [address]    Report Status PENDING  Incomplete     Terri Piedra, [facility]  06/01/2021  10:26 AM

## 2021-06-01 NOTE — Progress Notes (Signed)
PROGRESS NOTE    Holly Bradley   VQX:[id number]  DOB: [birth year]  DOA: 05/15/2021 PCP: Mitzi Hansen, MD   Brief Narrative:  Holly Bradley is a 45  Y/o with HTN, DM 2, large uterine fibroids, pSVT, HFpEF, paranoid psychosis who presented on 5/24 with confusion.  She was found to have a right MCA M1 occlusion and underwent a thrombectomy which was complicated by contrast extravasation and M3 coil embolization and revascularization. She developed a subarachnoid hemorrhage. Repeat imaging reveal a right basal ganglia infarct with a 3 mm right to left shift. She was started on hypertonic saline. On 5/26 she developed respiratory distress and was intubated. On 5/26 a head CT showed increased cytotoxic edema and a left sided shift with was 5 mm. She has not had improvement in mental status and now has a trach. She continues to require tube feeds. She has been treated for MSSA pneumonia with Ancef (course complted on 6/7)  Blood cultures on 5/30 noted to be positive for staph Epi. These were repeated and remain positive. Ancef has been resumed.    Subjective: No complaints.     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 complicated by ruptured right M3 causing which was successfully embolized Dysphagia - currently awake but minimally communicative with trach and tube feeds - hypercoagulable w/u results pending - Family would like PEG tube- general surgery following with a plan for open G tube due to extensive fibroids and difficult anatomy - cont Lipitor and ASA 81 mg  Active Problems:   Acute respiratory failure with hypoxia  - continue Trach per PCCM- they plan to see Tues and Thurs for trach care  MSSA pneumonia - 1 wk of antibiotics completed on 6/6  Staph epidermidis bacteremia, fever, leucocytosis> sepsis - noted on blood cultures from 5/30 and again on repeat culture from 6/6 -  restarted Ancef 6/8 -  based on  sensitivities, culture are growing 2 different types of staph epi (one is a MRSE) and also staph hominis - I have asked for an ID opinion- ID recommends holding off on Abx as cultures likely contaminated- temp 101 today- will check UA - she is tachycardic, has tachypnea- Lactic acid is normal     Fibroids - extremely large fibroids palpable on exam - she had some vaginal bleeding & OB/GYN evaluated her on 3/31 and recommended Megace 80 mg TID to stop the bleeding (on a lower dose a outpt)- also stated that she is currently not a surgical candidate - I spoke with Dr Elly Modena and she recommended to continue Megace- A fibroidectomy can be revisited in the future - mild vaginal bleeding overnight- follow     Iron deficiency anemia due to chronic blood loss - Hb ~ 7- 8 - resumed oral Iron     Chronic systolic and diastolic CHF - ECHO on 0/03 reveals that EF has improved to normal and she has grade 2 d CHF - cont Entresto, Imdur and Coreg  - Hydralazine resumed today - Demadex on hold  - following fluid status  Nutrition - cont the following per nutrition note>  osmolite 1.5 @ 50 cc /hr Prosource TF 45 ml BID 200 cc freee water Q 6 hrs for a total of 1512 cc/day  H/o PSVT - cont Amiodarone (home med)  HTN - cont Coreg, Amlodipine, Hydralazine, Isosorbide  Tobacco abuse - prior to admission  Thrombocytosis - ? Acute phase reactant in setting  of infection- follow  Time spent in minutes: 35 DVT prophylaxis: SCD's Start: 05/15/21 0753 Code Status: no CPR Family Communication:  Level of Care: Level of care: Med-Surg Disposition Plan:  Status is: Inpatient  Remains inpatient appropriate because:IV treatments appropriate due to intensity of illness or inability to take PO   Dispo: The patient is from: Home              Anticipated d/c is to:  TBD              Patient currently is not medically stable to d/c.   Difficult to place patient Yes   Consultants:   Neurology PCCM Palliative care IR Procedures:  thrombectomy Antimicrobials:  Anti-infectives (From admission, onward)    Start     Dose/Rate Route Frequency Ordered Stop   05/30/21 1515  ceFAZolin (ANCEF) IVPB 2g/100 mL premix  Status:  Discontinued        2 g 200 mL/hr over 30 Minutes Intravenous Every 8 hours 05/30/21 1415 05/31/21 1404   05/21/21 1100  ceFAZolin (ANCEF) IVPB 2g/100 mL premix        2 g 200 mL/hr over 30 Minutes Intravenous Every 8 hours 05/21/21 1030 05/28/21 0537        Objective: Vitals:   05/31/21 2037 05/31/21 2306 06/01/21 0319 06/01/21 0411  BP: (!) 150/75   (!) 163/83  Pulse: (!) 106 (!) 102 (!) 102 (!) 110  Resp: (!) 27 (!) 25 (!) 21 (!) 25  Temp:    99.6 F (37.6 C)  TempSrc:    Oral  SpO2: 97% 94% 96% 99%  Weight:      Height:        Intake/Output Summary (Last 24 hours) at 06/01/2021 0801 Last data filed at 05/31/2021 2002 Gross per 24 hour  Intake --  Output 325 ml  Net -325 ml    Filed Weights   05/30/21 0322 05/31/21 0355 05/31/21 1528  Weight: 74 kg 80.2 kg 76.6 kg    Examination: General exam: Appears comfortable  HEENT: PERRLA, oral mucosa moist, no sclera icterus or thrush- trach and cor trac present Respiratory system: Clear to auscultation. Respiratory effort normal. Cardiovascular system: S1 & S2 heard, regular rate and rhythm Gastrointestinal system: Abdomen soft, non-tender, nondistended. Normal bowel sounds   Central nervous system: unchanged from yesterday Extremities: No cyanosis, clubbing or edema- edema of hands has resolved Skin: No rashes or ulcers Psychiatry:  Mood & affect appropriate.          Data Reviewed: I have personally reviewed following labs and imaging studies  CBC: Recent Labs  Lab 05/28/21 0430 05/29/21 0254 05/30/21 1041 05/31/21 0314 06/01/21 0255 06/01/21 0640  WBC 19.4* 17.1* 13.2* 13.1* 11.4*  --   NEUTROABS  --   --   --  11.1*  --   --   HGB 10.0* 8.8* 7.9* 8.0* 7.8* 8.1*   HCT 35.2* 30.9* 27.4* 27.2* 26.9* 28.3*  MCV 77.2* 75.9* 76.3* 76.6* 77.5*  --   PLT 427* 460* 501* 533* 577*  --     Basic Metabolic Panel: Recent Labs  Lab 05/28/21 0430 05/29/21 0254 05/30/21 1041 05/31/21 0314 06/01/21 0255  NA 140 137 139 137 135  K 5.3* 4.4 4.7 4.6 4.6  CL 109 108 111 107 107  CO2 21* 21* 22 20* 20*  GLUCOSE 101* 112* 118* 113* 110*  BUN 26* 25* _0 CREATININE 1.30* 1.18* 1.17* 1.12* 1.04*  CALCIUM 9.1 9.3 9.0  9.0 9.1    GFR: Estimated Creatinine Clearance: 68.4 mL/min (A) (by C-G formula based on SCr of 1.04 mg/dL (H)). Liver Function Tests: No results for input(s): AST, ALT, ALKPHOS, BILITOT, PROT, ALBUMIN in the last 168 hours. No results for input(s): LIPASE, AMYLASE in the last 168 hours. No results for input(s): AMMONIA in the last 168 hours. Coagulation Profile: No results for input(s): INR, PROTIME in the last 168 hours. Cardiac Enzymes: No results for input(s): CKTOTAL, CKMB, CKMBINDEX, TROPONINI in the last 168 hours. BNP (last 3 results) No results for input(s): PROBNP in the last 8760 hours. HbA1C: No results for input(s): HGBA1C in the last 72 hours. CBG: Recent Labs  Lab 05/31/21 0759 05/31/21 1215 05/31/21 1554 05/31/21 2023 06/01/21 0413  GLUCAP 128* 114* 105* 113* 117*    Lipid Profile: No results for input(s): CHOL, HDL, LDLCALC, TRIG, CHOLHDL, LDLDIRECT in the last 72 hours. Thyroid Function Tests: No results for input(s): TSH, T4TOTAL, FREET4, T3FREE, THYROIDAB in the last 72 hours. Anemia Panel: No results for input(s): VITAMINB12, FOLATE, FERRITIN, TIBC, IRON, RETICCTPCT in the last 72 hours. Urine analysis:    Component Value Date/Time   COLORURINE STRAW (A) 05/15/2021 0445   APPEARANCEUR CLEAR 05/15/2021 0445   LABSPEC 1.005 05/15/2021 0445   PHURINE 7.0 05/15/2021 0445   GLUCOSEU NEGATIVE 05/15/2021 0445   HGBUR SMALL (A) 05/15/2021 0445   BILIRUBINUR NEGATIVE 05/15/2021 0445   KETONESUR NEGATIVE  05/15/2021 0445   PROTEINUR 100 (A) 05/15/2021 0445   NITRITE NEGATIVE 05/15/2021 0445   LEUKOCYTESUR NEGATIVE 05/15/2021 0445   Sepsis Labs: _0 (procalcitonin:4,lacticidven:4) ) Recent Results (from the past 240 hour(s))  Culture, Respiratory w Gram Stain     Status: None   Collection Time: 05/28/21  7:43 AM   Specimen: Tracheal Aspirate; Respiratory  Result Value Ref Range Status   Specimen Description TRACHEAL ASPIRATE  Final   Special Requests NONE  Final   Gram Stain   Final    FEW WBC PRESENT,BOTH PMN AND MONONUCLEAR RARE GRAM POSITIVE COCCI IN PAIRS    Culture   Final    Normal respiratory flora-no Staph aureus or Pseudomonas seen Performed at [facility], [address] [place],  [address]    Report Status 05/30/2021 FINAL  Final  Culture, blood (routine x 2)     Status: Abnormal   Collection Time: 05/28/21  8:27 AM   Specimen: BLOOD  Result Value Ref Range Status   Specimen Description BLOOD RIGHT ANTECUBITAL  Final   Special Requests   Final    BOTTLES DRAWN AEROBIC AND ANAEROBIC Blood Culture adequate volume   Culture  Setup Time   Final    GRAM POSITIVE COCCI IN CLUSTERS AEROBIC BOTTLE ONLY CRITICAL VALUE NOTED.  VALUE IS CONSISTENT WITH PREVIOUSLY REPORTED AND CALLED VALUE. Performed at [facility], [place] [address], [place], [place] [address]    Culture STAPHYLOCOCCUS EPIDERMIDIS (A)  Final   Report Status 05/31/2021 FINAL  Final   Organism ID, Bacteria STAPHYLOCOCCUS EPIDERMIDIS  Final      Susceptibility   Staphylococcus epidermidis - MIC*    CIPROFLOXACIN >=8 RESISTANT Resistant     ERYTHROMYCIN >=8 RESISTANT Resistant     GENTAMICIN <=0.5 SENSITIVE Sensitive     OXACILLIN >=4 RESISTANT Resistant     TETRACYCLINE 2 SENSITIVE Sensitive     VANCOMYCIN <=0.5 SENSITIVE Sensitive     TRIMETH/SULFA 80 RESISTANT Resistant     CLINDAMYCIN >=8 RESISTANT Resistant     RIFAMPIN <=  0.5 SENSITIVE Sensitive     Inducible Clindamycin  NEGATIVE Sensitive     * STAPHYLOCOCCUS EPIDERMIDIS  Culture, blood (routine x 2)     Status: Abnormal (Preliminary result)   Collection Time: 05/28/21  8:28 AM   Specimen: BLOOD  Result Value Ref Range Status   Specimen Description BLOOD RIGHT ANTECUBITAL  Final   Special Requests   Final    BOTTLES DRAWN AEROBIC AND ANAEROBIC Blood Culture adequate volume   Culture  Setup Time   Final    GRAM POSITIVE COCCI IN CLUSTERS AEROBIC BOTTLE ONLY CRITICAL VALUE NOTED.  VALUE IS CONSISTENT WITH PREVIOUSLY REPORTED AND CALLED VALUE.    Culture (A)  Final    STAPHYLOCOCCUS EPIDERMIDIS CULTURE REINCUBATED FOR BETTER GROWTH Performed at [facility], [place] [address] [place], [place] [address]    Report Status PENDING  Incomplete         Radiology Studies: DG Chest Port 1 View  Result Date: 05/30/2021 CLINICAL DATA:  Fever. EXAM: PORTABLE CHEST 1 VIEW COMPARISON:  05/25/2021. FINDINGS: Tracheostomy tube and feeding tube in stable position. Cardiomegaly. No pulmonary venous congestion. Left lower lobe atelectasis/consolidation again noted. Small left pleural effusion. No pneumothorax. IMPRESSION: 1.  Tracheostomy tube and feeding tube in stable position. 2.  Cardiomegaly.  No pulmonary venous congestion. 3. Left lower lobe atelectasis/consolidation again noted. Small left pleural effusion. Electronically Signed   By: Marcello Moores  Register   On: 05/30/2021 09:14   DG Abd Portable 1V  Result Date: 05/30/2021 CLINICAL DATA:  Ileus. EXAM: PORTABLE ABDOMEN - 1 VIEW COMPARISON:  05/26/2021. FINDINGS: Feeding tube noted with tip over the stomach. No bowel distention. No free air. Large abdominopelvic mass again noted. Reference is made to CT report of 05/17/2021. IMPRESSION: 1. Feeding tube noted with tip over the stomach. No bowel distention. 2. Large abdominopelvic mass again noted. Reference is made to CT report of 05/17/2021. Electronically Signed   By: Marcello Moores  Register   On: 05/30/2021 12:46        Scheduled Meds:  amiodarone  200 mg Per Tube Daily   amLODipine  10 mg Per Tube Daily   aspirin  81 mg Per Tube Daily   atorvastatin  40 mg Per Tube Daily   carvedilol  25 mg Per Tube BID WC   chlorhexidine gluconate (MEDLINE KIT)  15 mL Mouth Rinse BID   Chlorhexidine Gluconate Cloth  6 each Topical Daily   feeding supplement (PROSource TF)  45 mL Per Tube BID   ferrous sulfate  300 mg Per Tube BID WC   free water  200 mL Per Tube Q6H   hydrALAZINE  100 mg Per Tube Q8H   hydrALAZINE  100 mg Oral TID   isosorbide dinitrate  40 mg Per Tube TID   mouth rinse  15 mL Mouth Rinse 10 times per day   megestrol  80 mg Per Tube TID   multivitamin with minerals  1 tablet Per Tube Daily   pantoprazole sodium  40 mg Per Tube Daily   sacubitril-valsartan  1 tablet Per Tube BID   Continuous Infusions:  feeding supplement (OSMOLITE 1.5 CAL) 1,000 mL (05/31/21 1801)     LOS: 17 days      Debbe Odea, MD [facility] Pager: [email or web address] 06/01/2021, 8:01 AM

## 2021-06-02 ENCOUNTER — Inpatient Hospital Stay (HOSPITAL_COMMUNITY): Payer: Medicaid Other

## 2021-06-02 DIAGNOSIS — R140 Abdominal distension (gaseous): Secondary | ICD-10-CM

## 2021-06-02 DIAGNOSIS — Z01818 Encounter for other preprocedural examination: Secondary | ICD-10-CM

## 2021-06-02 DIAGNOSIS — Z4659 Encounter for fitting and adjustment of other gastrointestinal appliance and device: Secondary | ICD-10-CM

## 2021-06-02 DIAGNOSIS — G936 Cerebral edema: Secondary | ICD-10-CM

## 2021-06-02 DIAGNOSIS — D500 Iron deficiency anemia secondary to blood loss (chronic): Secondary | ICD-10-CM

## 2021-06-02 DIAGNOSIS — B957 Other staphylococcus as the cause of diseases classified elsewhere: Secondary | ICD-10-CM

## 2021-06-02 DIAGNOSIS — K567 Ileus, unspecified: Secondary | ICD-10-CM

## 2021-06-02 DIAGNOSIS — I5022 Chronic systolic (congestive) heart failure: Secondary | ICD-10-CM

## 2021-06-02 DIAGNOSIS — R7881 Bacteremia: Secondary | ICD-10-CM

## 2021-06-02 LAB — GLUCOSE, CAPILLARY
Glucose-Capillary: 118 mg/dL — ABNORMAL HIGH (ref 70–99)
Glucose-Capillary: 124 mg/dL — ABNORMAL HIGH (ref 70–99)
Glucose-Capillary: 120 mg/dL — ABNORMAL HIGH (ref 70–99)
Glucose-Capillary: 109 mg/dL — ABNORMAL HIGH (ref 70–99)
Glucose-Capillary: 114 mg/dL — ABNORMAL HIGH (ref 70–99)

## 2021-06-02 LAB — CBC
HCT: 27 % — ABNORMAL LOW (ref 36.0–46.0)
Hemoglobin: 7.9 g/dL — ABNORMAL LOW (ref 12.0–15.0)
MCH: 22.9 pg — ABNORMAL LOW (ref 26.0–34.0)
MCHC: 29.3 g/dL — ABNORMAL LOW (ref 30.0–36.0)
MCV: 78.3 fL — ABNORMAL LOW (ref 80.0–100.0)
Platelets: 638 10*3/uL — ABNORMAL HIGH (ref 150–400)
RBC: 3.45 MIL/uL — ABNORMAL LOW (ref 3.87–5.11)
RDW: 30.4 % — ABNORMAL HIGH (ref 11.5–15.5)
WBC: 11.5 10*3/uL — ABNORMAL HIGH (ref 4.0–10.5)
nRBC: 0.2 % (ref 0.0–0.2)

## 2021-06-02 LAB — CULTURE, BLOOD (ROUTINE X 2): Special Requests: ADEQUATE

## 2021-06-02 LAB — BASIC METABOLIC PANEL
Anion gap: 10 (ref 5–15)
BUN: 20 mg/dL (ref 6–20)
CO2: 19 mmol/L — ABNORMAL LOW (ref 22–32)
Calcium: 9.3 mg/dL (ref 8.9–10.3)
Chloride: 108 mmol/L (ref 98–111)
Creatinine, Ser: 1.14 mg/dL — ABNORMAL HIGH (ref 0.44–1.00)
GFR, Estimated: 60 mL/min — ABNORMAL LOW (ref >60)
Glucose, Bld: 125 mg/dL — ABNORMAL HIGH (ref 70–99)
Potassium: 4.6 mmol/L (ref 3.5–5.1)
Sodium: 137 mmol/L (ref 135–145)

## 2021-06-02 IMAGING — DX DG CHEST 1V PORT
1 series · 1 of 1 positions shown · non-contrast
Comparison: [DATE]

CLINICAL DATA: Fever.

EXAM:
PORTABLE CHEST 1 VIEW

[chest]
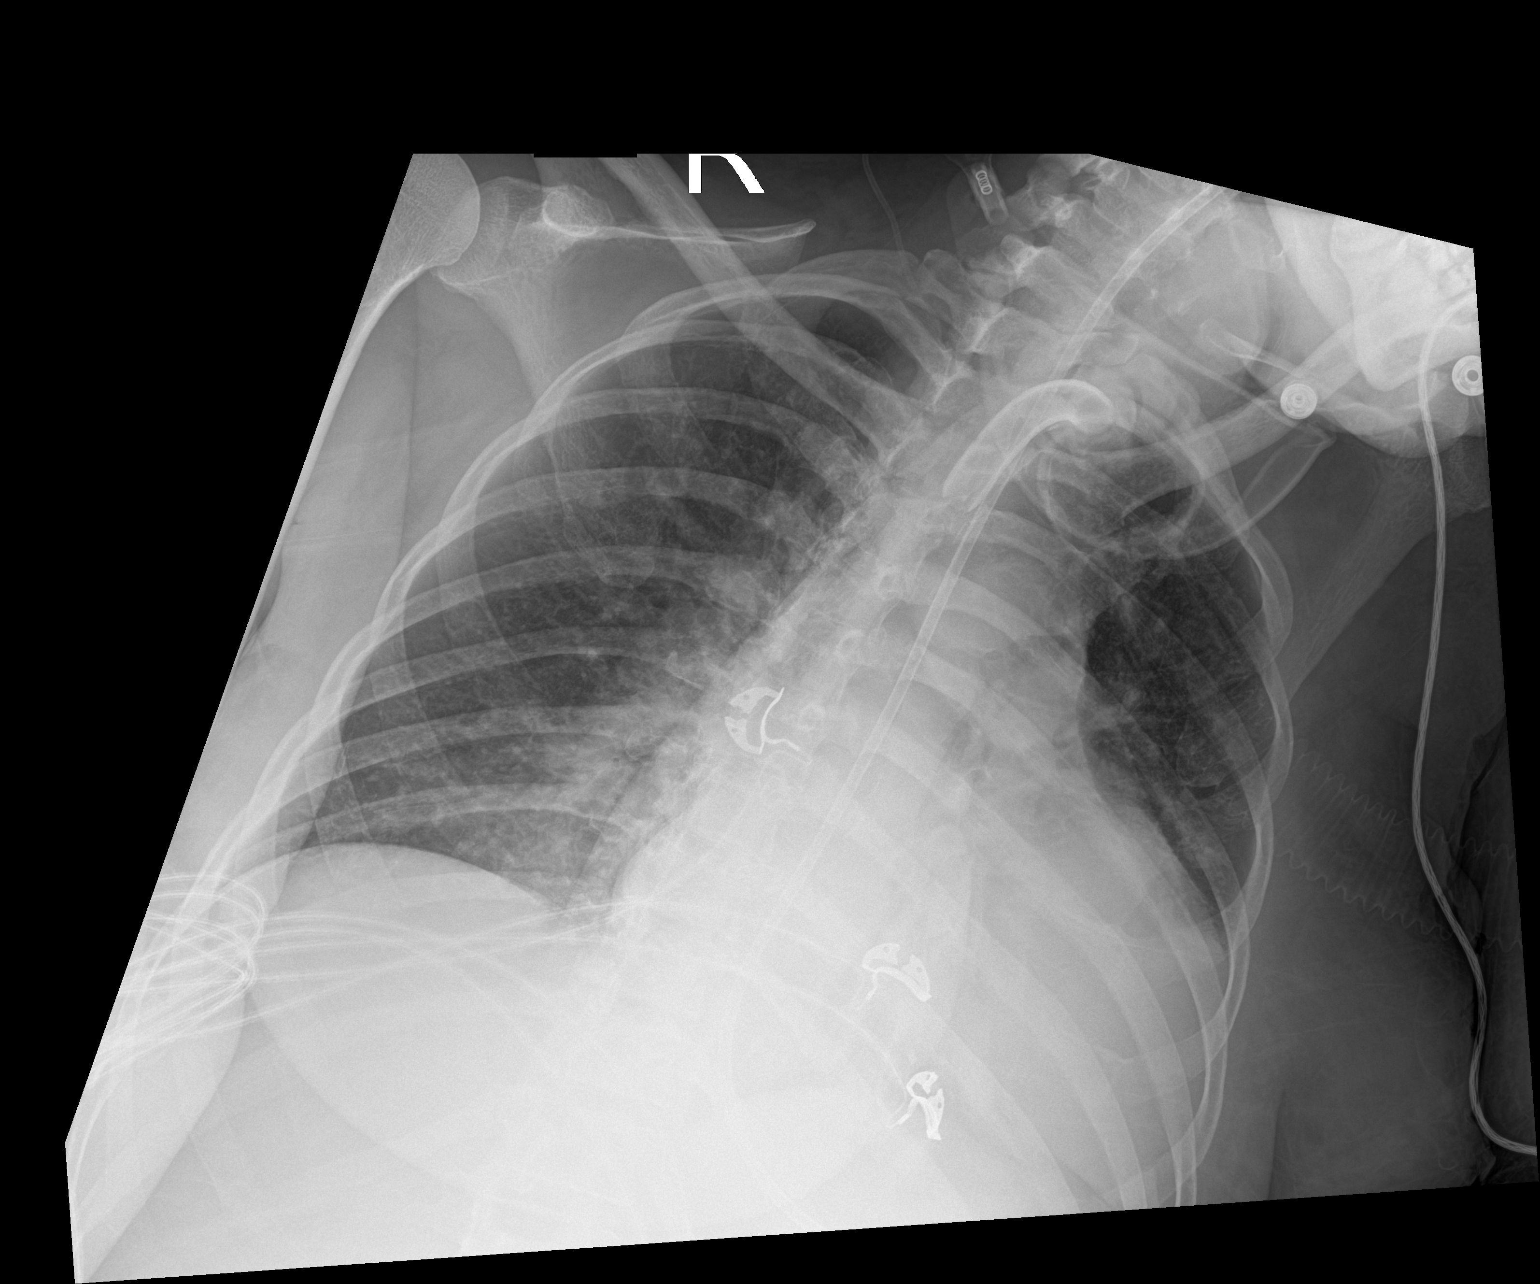

[1 of 1 positions shown; findings below may reference images not displayed]

FINDINGS: Stable enlarged cardiac silhouette. Tracheostomy tube in
satisfactory position. Feeding tube extending into the stomach.
Dense left lower lobe airspace consolidation is again demonstrated.
Mild patchy density at the medial right lung base. Unremarkable
bones.
IMPRESSION: 1. Stable dense left lower lobe atelectasis or pneumonia.
2. Interval mild patchy atelectasis or pneumonia at the medial right
lung base.
3. Stable cardiomegaly.

## 2021-06-02 NOTE — Plan of Care (Signed)
  Problem: Education: Goal: Knowledge of disease or condition will improve Outcome: Progressing Goal: Knowledge of secondary prevention will improve Outcome: Progressing   Problem: Education: Goal: Knowledge of General Education information will improve Description: Including pain rating scale, medication(s)/side effects and non-pharmacologic comfort measures Outcome: Progressing   Problem: Health Behavior/Discharge Planning: Goal: Ability to manage health-related needs will improve Outcome: Progressing   Problem: Clinical Measurements: Goal: Ability to maintain clinical measurements within normal limits will improve Outcome: Progressing Goal: Will remain free from infection Outcome: Progressing Goal: Diagnostic test results will improve Outcome: Progressing Goal: Respiratory complications will improve Outcome: Progressing Goal: Cardiovascular complication will be avoided Outcome: Progressing   Problem: Activity: Goal: Risk for activity intolerance will decrease Outcome: Progressing   Problem: Nutrition: Goal: Adequate nutrition will be maintained Outcome: Progressing   Problem: Coping: Goal: Level of anxiety will decrease Outcome: Progressing   Problem: Elimination: Goal: Will not experience complications related to bowel motility Outcome: Progressing Goal: Will not experience complications related to urinary retention Outcome: Progressing   Problem: Pain Managment: Goal: General experience of comfort will improve Outcome: Progressing   Problem: Safety: Goal: Ability to remain free from injury will improve Outcome: Progressing   Problem: Skin Integrity: Goal: Risk for impaired skin integrity will decrease Outcome: Progressing

## 2021-06-02 NOTE — Progress Notes (Signed)
PROGRESS NOTE    Holly Bradley   VQQ:[id number]  DOB: 10-Mar-1976  DOA: 05/15/2021 PCP: Mitzi Hansen, MD   Brief Narrative:  Holly Bradley is a 45  Y/o with HTN, DM 2, large uterine fibroids, pSVT, HFpEF, paranoid psychosis who presented on 5/24 with confusion.  She was found to have a right MCA M1 occlusion and underwent a thrombectomy which was complicated by contrast extravasation and M3 coil embolization and revascularization. She developed a subarachnoid hemorrhage. Repeat imaging reveal a right basal ganglia infarct with a 3 mm right to left shift. She was started on hypertonic saline. On 5/26 she developed respiratory distress and was intubated. On 5/26 a head CT showed increased cytotoxic edema and a left sided shift with was 5 mm. She has not had improvement in mental status and now has a trach. She continues to require tube feeds. She has been treated for MSSA pneumonia with Ancef (course complted on 6/7)  Blood cultures on 5/30 noted to be positive for staph Epi. These were repeated and remain positive. Ancef has been resumed.    Subjective: No complaints.     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 complicated by ruptured right M3 causing which was successfully embolized Dysphagia - awake minimally communicative with trach and tube feeds - hypercoagulable w/u as follows: -Anticardiolipin IgM is 17, DRVVT 80.2 (range from moderate is 20-80)- have has stroke team to review findings - Family would like PEG tube- general surgery following with a plan for open G tube due to extensive fibroids and difficult anatomy - cont Lipitor and ASA 81 mg  Active Problems:   Acute respiratory failure with hypoxia  - continue Trach per PCCM- they plan to see Tues and Thurs for trach care  MSSA pneumonia - 1 wk of antibiotics completed on 6/6  Staph epidermidis bacteremia, fever, leucocytosis> sepsis - noted on blood  cultures from 5/30 and again on repeat culture from 6/6 -   Ancef resumed 6/8-6/9 -  based on sensitivities, culture are growing 2 different types of staph epi (one is a MRSE) and also staph hominis  - ID recommends holding off on Abx as cultures likely contaminated- last elevated tem temp 101 ton 6/10   UA negative - she is tachycardic, has tachypnea- Lactic acid is normal     Fibroids - extremely large fibroids palpable on exam - she had some vaginal bleeding & OB/GYN evaluated her on 3/31 and recommended Megace 80 mg TID to stop the bleeding (on a lower dose a outpt)- also stated that she is currently not a surgical candidate - I spoke with Dr Elly Modena and she recommended to continue Megace- A fibroidectomy can be revisited in the future     Iron deficiency anemia due to chronic blood loss - Hb ~ 7- 8 - resumed oral Iron     Chronic systolic and diastolic CHF - ECHO on 4/33 reveals that EF has improved to normal and she has grade 2 d CHF - cont Entresto, Imdur and Coreg  - Hydralazine resumed today - Demadex on hold  - following fluid status  Nutrition - cont the following per nutrition note>  osmolite 1.5 @ 50 cc /hr Prosource TF 45 ml BID 200 cc freee water Q 6 hrs for a total of 1512 cc/day  H/o PSVT - cont Amiodarone (home med)  HTN - cont Coreg, Amlodipine, Hydralazine, Isosorbide  Tobacco abuse - prior to  admission  Thrombocytosis - following  Time spent in minutes: 35 DVT prophylaxis: SCD's Start: 05/15/21 0753 Code Status: no CPR Family Communication:  Level of Care: Level of care: Med-Surg Disposition Plan:  Status is: Inpatient  Remains inpatient appropriate because:IV treatments appropriate due to intensity of illness or inability to take PO   Dispo: The patient is from: Home              Anticipated d/c is to:  TBD              Patient currently is not medically stable to d/c.   Difficult to place patient Yes   Consultants:   Neurology PCCM Palliative care IR Procedures:  thrombectomy Antimicrobials:  Anti-infectives (From admission, onward)    Start     Dose/Rate Route Frequency Ordered Stop   05/30/21 1515  ceFAZolin (ANCEF) IVPB 2g/100 mL premix  Status:  Discontinued        2 g 200 mL/hr over 30 Minutes Intravenous Every 8 hours 05/30/21 1415 05/31/21 1404   05/21/21 1100  ceFAZolin (ANCEF) IVPB 2g/100 mL premix        2 g 200 mL/hr over 30 Minutes Intravenous Every 8 hours 05/21/21 1030 05/28/21 0537        Objective: Vitals:   06/02/21 0415 06/02/21 0802 06/02/21 0850 06/02/21 1136  BP: 140/73 (!) 157/81    Pulse: (!) 102 (!) 102 83 96  Resp: '18 20 20 ' (!) 22  Temp: 98.6 F (37 C) 98.4 F (36.9 C)    TempSrc: Oral Axillary    SpO2: 98% 99% 99% 97%  Weight:      Height:        Intake/Output Summary (Last 24 hours) at 06/02/2021 1216 Last data filed at 06/02/2021 0659 Gross per 24 hour  Intake --  Output 1100 ml  Net -1100 ml    Filed Weights   05/30/21 0322 05/31/21 0355 05/31/21 1528  Weight: 74 kg 80.2 kg 76.6 kg    Examination: General exam: Appears comfortable  HEENT: PERRLA, oral mucosa moist, no sclera icterus or thrush- trach and cor track present Respiratory system: Clear to auscultation. Respiratory effort normal. Cardiovascular system: S1 & S2 heard, regular rate and rhythm Gastrointestinal system: Abdomen soft, non-tender, nondistended. Normal bowel sounds   Central nervous system: Alert - dense left sided hemiplegia and left sided vision loss Extremities: No cyanosis, clubbing or edema Skin: No rashes or ulcers Psychiatry:  Mood & affect appropriate.          Data Reviewed: I have personally reviewed following labs and imaging studies  CBC: Recent Labs  Lab 05/29/21 0254 05/30/21 1041 05/31/21 0314 06/01/21 0255 06/01/21 0640 06/02/21 0306  WBC 17.1* 13.2* 13.1* 11.4*  --  11.5*  NEUTROABS  --   --  11.1*  --   --   --   HGB 8.8* 7.9* 8.0* 7.8*  8.1* 7.9*  HCT 30.9* 27.4* 27.2* 26.9* 28.3* 27.0*  MCV 75.9* 76.3* 76.6* 77.5*  --  78.3*  PLT 460* 501* 533* 577*  --  638*    Basic Metabolic Panel: Recent Labs  Lab 05/29/21 0254 05/30/21 1041 05/31/21 0314 06/01/21 0255 06/02/21 0306  NA 137 139 137 135 137  K 4.4 4.7 4.6 4.6 4.6  CL 108 111 107 107 108  CO2 21* 22 20* 20* 19*  GLUCOSE 112* 118* 113* 110* 125*  BUN 25* '20 19 18 20  ' CREATININE 1.18* 1.17* 1.12* 1.04* 1.14*  CALCIUM 9.3  9.0 9.0 9.1 9.3    GFR: Estimated Creatinine Clearance: 62.4 mL/min (A) (by C-G formula based on SCr of 1.14 mg/dL (H)). Liver Function Tests: No results for input(s): AST, ALT, ALKPHOS, BILITOT, PROT, ALBUMIN in the last 168 hours. No results for input(s): LIPASE, AMYLASE in the last 168 hours. No results for input(s): AMMONIA in the last 168 hours. Coagulation Profile: No results for input(s): INR, PROTIME in the last 168 hours. Cardiac Enzymes: No results for input(s): CKTOTAL, CKMB, CKMBINDEX, TROPONINI in the last 168 hours. BNP (last 3 results) No results for input(s): PROBNP in the last 8760 hours. HbA1C: No results for input(s): HGBA1C in the last 72 hours. CBG: Recent Labs  Lab 06/01/21 1557 06/01/21 2001 06/01/21 2339 06/02/21 0342 06/02/21 0816  GLUCAP 89 108* 107* 118* 124*    Lipid Profile: No results for input(s): CHOL, HDL, LDLCALC, TRIG, CHOLHDL, LDLDIRECT in the last 72 hours. Thyroid Function Tests: No results for input(s): TSH, T4TOTAL, FREET4, T3FREE, THYROIDAB in the last 72 hours. Anemia Panel: No results for input(s): VITAMINB12, FOLATE, FERRITIN, TIBC, IRON, RETICCTPCT in the last 72 hours. Urine analysis:    Component Value Date/Time   COLORURINE YELLOW 06/01/2021 2051   APPEARANCEUR HAZY (A) 06/01/2021 2051   LABSPEC 1.012 06/01/2021 2051   PHURINE 7.0 06/01/2021 2051   GLUCOSEU NEGATIVE 06/01/2021 2051   HGBUR MODERATE (A) 06/01/2021 2051   BILIRUBINUR NEGATIVE 06/01/2021 2051   Scraper 06/01/2021 2051   PROTEINUR 30 (A) 06/01/2021 2051   NITRITE NEGATIVE 06/01/2021 2051   LEUKOCYTESUR SMALL (A) 06/01/2021 2051   Sepsis Labs: '@[email or web address]' (procalcitonin:4,lacticidven:4) ) Recent Results (from the past 240 hour(s))  Culture, Respiratory w Gram Stain     Status: None   Collection Time: 05/28/21  7:43 AM   Specimen: Tracheal Aspirate; Respiratory  Result Value Ref Range Status   Specimen Description TRACHEAL ASPIRATE  Final   Special Requests NONE  Final   Gram Stain   Final    FEW WBC PRESENT,BOTH PMN AND MONONUCLEAR RARE GRAM POSITIVE COCCI IN PAIRS    Culture   Final    Normal respiratory flora-no Staph aureus or Pseudomonas seen Performed at [facility], [address] [place], [place] [address]    Report Status 05/30/2021 FINAL  Final  Culture, blood (routine x 2)     Status: Abnormal   Collection Time: 05/28/21  8:27 AM   Specimen: BLOOD  Result Value Ref Range Status   Specimen Description BLOOD RIGHT ANTECUBITAL  Final   Special Requests   Final    BOTTLES DRAWN AEROBIC AND ANAEROBIC Blood Culture adequate volume   Culture  Setup Time   Final    GRAM POSITIVE COCCI IN CLUSTERS AEROBIC BOTTLE ONLY CRITICAL VALUE NOTED.  VALUE IS CONSISTENT WITH PREVIOUSLY REPORTED AND CALLED VALUE. Performed at [facility], [place] [address], [place], [place] [address]    Culture STAPHYLOCOCCUS EPIDERMIDIS (A)  Final   Report Status 05/31/2021 FINAL  Final   Organism ID, Bacteria STAPHYLOCOCCUS EPIDERMIDIS  Final      Susceptibility   Staphylococcus epidermidis - MIC*    CIPROFLOXACIN >=8 RESISTANT Resistant     ERYTHROMYCIN >=8 RESISTANT Resistant     GENTAMICIN <=0.5 SENSITIVE Sensitive     OXACILLIN >=4 RESISTANT Resistant     TETRACYCLINE 2 SENSITIVE Sensitive     VANCOMYCIN <=0.5 SENSITIVE Sensitive     TRIMETH/SULFA 80 RESISTANT Resistant     CLINDAMYCIN >=8 RESISTANT Resistant  RIFAMPIN <=0.5 SENSITIVE Sensitive     Inducible  Clindamycin NEGATIVE Sensitive     * STAPHYLOCOCCUS EPIDERMIDIS  Culture, blood (routine x 2)     Status: Abnormal   Collection Time: 05/28/21  8:28 AM   Specimen: BLOOD  Result Value Ref Range Status   Specimen Description BLOOD RIGHT ANTECUBITAL  Final   Special Requests   Final    BOTTLES DRAWN AEROBIC AND ANAEROBIC Blood Culture adequate volume   Culture  Setup Time   Final    GRAM POSITIVE COCCI IN CLUSTERS AEROBIC BOTTLE ONLY CRITICAL VALUE NOTED.  VALUE IS CONSISTENT WITH PREVIOUSLY REPORTED AND CALLED VALUE. Performed at [facility], [place] [address], [place], [place] [address]    Culture STAPHYLOCOCCUS EPIDERMIDIS (A)  Final   Report Status 06/02/2021 FINAL  Final   Organism ID, Bacteria STAPHYLOCOCCUS EPIDERMIDIS  Final      Susceptibility   Staphylococcus epidermidis - MIC*    CIPROFLOXACIN >=8 RESISTANT Resistant     ERYTHROMYCIN <=0.25 SENSITIVE Sensitive     GENTAMICIN <=0.5 SENSITIVE Sensitive     OXACILLIN >=4 RESISTANT Resistant     TETRACYCLINE 2 SENSITIVE Sensitive     VANCOMYCIN <=0.5 SENSITIVE Sensitive     TRIMETH/SULFA 80 RESISTANT Resistant     CLINDAMYCIN <=0.25 SENSITIVE Sensitive     RIFAMPIN <=0.5 SENSITIVE Sensitive     Inducible Clindamycin NEGATIVE Sensitive     * STAPHYLOCOCCUS EPIDERMIDIS         Radiology Studies: No results found.     Scheduled Meds:  amiodarone  200 mg Per Tube Daily   amLODipine  10 mg Per Tube Daily   aspirin  81 mg Per Tube Daily   atorvastatin  40 mg Per Tube Daily   carvedilol  25 mg Per Tube BID WC   chlorhexidine gluconate (MEDLINE KIT)  15 mL Mouth Rinse BID   Chlorhexidine Gluconate Cloth  6 each Topical Daily   cloNIDine  0.1 mg Per Tube TID   feeding supplement (PROSource TF)  45 mL Per Tube BID   ferrous sulfate  300 mg Per Tube BID WC   free water  200 mL Per Tube Q6H   hydrALAZINE  100 mg Per Tube Q8H   isosorbide dinitrate  40 mg Per Tube TID   mouth rinse  15 mL Mouth Rinse 10 times per  day   megestrol  80 mg Per Tube TID   multivitamin with minerals  1 tablet Per Tube Daily   pantoprazole sodium  40 mg Per Tube Daily   sacubitril-valsartan  1 tablet Per Tube BID   Continuous Infusions:  feeding supplement (OSMOLITE 1.5 CAL) 1,000 mL (05/31/21 1801)     LOS: 18 days      Debbe Odea, MD [facility] Pager: [email or web address] 06/02/2021, 12:16 PM

## 2021-06-02 NOTE — Progress Notes (Addendum)
Subjective:  Patient seems able to nod her head to different questions and from my questions does not appear to have new complaints.   Antibiotics:  Anti-infectives (From admission, onward)    Start     Dose/Rate Route Frequency Ordered Stop   05/30/21 1515  ceFAZolin (ANCEF) IVPB 2g/100 mL premix  Status:  Discontinued        2 g 200 mL/hr over 30 Minutes Intravenous Every 8 hours 05/30/21 1415 05/31/21 1404   05/21/21 1100  ceFAZolin (ANCEF) IVPB 2g/100 mL premix        2 g 200 mL/hr over 30 Minutes Intravenous Every 8 hours 05/21/21 1030 05/28/21 0537       Medications: Scheduled Meds:  amiodarone  200 mg Per Tube Daily   amLODipine  10 mg Per Tube Daily   aspirin  81 mg Per Tube Daily   atorvastatin  40 mg Per Tube Daily   carvedilol  25 mg Per Tube BID WC   chlorhexidine gluconate (MEDLINE KIT)  15 mL Mouth Rinse BID   Chlorhexidine Gluconate Cloth  6 each Topical Daily   cloNIDine  0.1 mg Per Tube TID   feeding supplement (PROSource TF)  45 mL Per Tube BID   ferrous sulfate  300 mg Per Tube BID WC   free water  200 mL Per Tube Q6H   hydrALAZINE  100 mg Per Tube Q8H   isosorbide dinitrate  40 mg Per Tube TID   mouth rinse  15 mL Mouth Rinse 10 times per day   megestrol  80 mg Per Tube TID   multivitamin with minerals  1 tablet Per Tube Daily   pantoprazole sodium  40 mg Per Tube Daily   sacubitril-valsartan  1 tablet Per Tube BID   Continuous Infusions:  feeding supplement (OSMOLITE 1.5 CAL) 1,000 mL (05/31/21 1801)   PRN Meds:.acetaminophen **OR** acetaminophen (TYLENOL) oral liquid 160 mg/5 mL **OR** acetaminophen, loperamide HCl, simethicone    Objective: Weight change:   Intake/Output Summary (Last 24 hours) at 06/02/2021 1351 Last data filed at 06/02/2021 1250 Gross per 24 hour  Intake --  Output 1600 ml  Net -1600 ml   Blood pressure (!) 142/68, pulse 95, temperature 99.6 F (37.6 C), temperature source Axillary, resp. rate (!) 22,  height _0  (1.651 m), weight 76.6 kg, SpO2 99 %. Temp:  [98.2 F (36.8 C)-99.6 F (37.6 C)] 99.6 F (37.6 C) (06/11 1248) Pulse Rate:  [83-109] 95 (06/11 1248) Resp:  [18-26] 22 (06/11 1248) BP: (128-161)/(63-85) 142/68 (06/11 1248) SpO2:  [97 %-100 %] 99 % (06/11 1248) FiO2 (%):  [21 %] 21 % (06/11 1136)  Physical Exam: Physical Exam HENT:     Head: Normocephalic and atraumatic.  Eyes:     Extraocular Movements: Extraocular movements intact.     Conjunctiva/sclera: Conjunctivae normal.  Neck:     Trachea: Tracheostomy present.  Cardiovascular:     Rate and Rhythm: Tachycardia present.     Heart sounds: No murmur heard.   No gallop.  Pulmonary:     Effort: No respiratory distress.     Breath sounds: No stridor. Rhonchi present. No wheezing.  Abdominal:     General: There is distension.     Palpations: There is mass.     Tenderness: There is no abdominal tenderness.  Musculoskeletal:        General: No swelling or tenderness.  Skin:    General: Skin is warm and dry.  Neurological:     Mental Status: She is alert.    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     Sedimentation Rate No results for input(s): ESRSEDRATE in the last 72 hours. C-Reactive Protein No results for input(s): CRP in the last 72 hours.  Micro Results: Recent Results (from the past 720 hour(s))  Resp Panel by RT-PCR (Flu A&B, Covid) Nasopharyngeal Swab     Status: None   Collection Time: 05/15/21  4:43 AM   Specimen: Nasopharyngeal Swab; Nasopharyngeal(NP) swabs in vial transport medium  Result Value Ref Range  Status   SARS Coronavirus 2 by RT PCR NEGATIVE NEGATIVE Final    Comment: (NOTE) SARS-CoV-2 target nucleic acids are NOT DETECTED.  The SARS-CoV-2 RNA is generally detectable in upper respiratory specimens during the acute phase of infection. The lowest concentration of SARS-CoV-2 viral copies this assay can detect is 138 copies/mL. A negative result does not preclude SARS-Cov-2 infection and should not be used as the sole basis for treatment or other patient management decisions. A negative result may occur with  improper specimen collection/handling, submission of specimen other than nasopharyngeal swab, presence of viral mutation(s) within the areas targeted by this assay, and inadequate number of viral copies(<138 copies/mL). A negative result must be combined with clinical observations, patient history, and epidemiological information. The expected result is Negative.  Fact Sheet for Patients:  EntrepreneurPulse.com.au  Fact Sheet for Healthcare Providers:  IncredibleEmployment.be  This test is no t yet approved or cleared by the [place] FDA and  has been authorized for detection and/or diagnosis of SARS-CoV-2 by FDA under an Emergency Use Authorization (EUA). This EUA will remain  in effect (meaning this test can be used) for the duration of the COVID-19 declaration under Section 564(b)(1) of the Act, 21 U.S.C.section 360bbb-3(b)(1), unless the authorization is terminated  or revoked sooner.       Influenza A by PCR NEGATIVE NEGATIVE Final   Influenza B by PCR NEGATIVE NEGATIVE Final    Comment: (NOTE) The Xpert Xpress SARS-CoV-2/FLU/RSV plus assay is intended as an aid in the diagnosis of influenza from Nasopharyngeal swab specimens and should not be used as a sole basis for treatment. Nasal washings and aspirates are unacceptable for Xpert Xpress SARS-CoV-2/FLU/RSV testing.  Fact Sheet for  Patients: EntrepreneurPulse.com.au  Fact Sheet for Healthcare Providers: IncredibleEmployment.be  This test is not yet approved or cleared by the [place] FDA and has been authorized for detection and/or diagnosis of SARS-CoV-2 by FDA under an Emergency Use Authorization (EUA). This EUA will remain in effect (meaning this test can be used) for the duration of the COVID-19 declaration under Section 564(b)(1) of the Act, 21 U.S.C. section 360bbb-3(b)(1), unless the authorization is terminated or revoked.  Performed at [facility], [place] [address], [place], [place] [address]   MRSA PCR Screening     Status: None   Collection Time: 05/15/21 10:45 AM   Specimen: Nasal Mucosa; Nasopharyngeal  Result Value Ref Range Status   MRSA  by PCR NEGATIVE NEGATIVE Final    Comment:        The GeneXpert MRSA Assay (FDA approved for NASAL specimens only), is one component of a comprehensive MRSA colonization surveillance program. It is not intended to diagnose MRSA infection nor to guide or monitor treatment for MRSA infections. Performed at [facility], [place] [address], [place], [place] [address]   Culture, Respiratory w Gram Stain     Status: None   Collection Time: 05/19/21  8:51 AM   Specimen: Tracheal Aspirate; Respiratory  Result Value Ref Range Status   Specimen Description TRACHEAL ASPIRATE  Final   Special Requests NONE  Final   Gram Stain   Final    ABUNDANT WBC PRESENT, PREDOMINANTLY PMN ABUNDANT GRAM POSITIVE COCCI IN CLUSTERS FEW GRAM NEGATIVE DIPLOCOCCI RARE GRAM NEGATIVE RODS Performed at [facility], [place] [address], [place], [place] [address]    Culture ABUNDANT STAPHYLOCOCCUS AUREUS  Final   Report Status 05/22/2021 FINAL  Final   Organism ID, Bacteria STAPHYLOCOCCUS AUREUS  Final      Susceptibility   Staphylococcus aureus - MIC*    CIPROFLOXACIN <=0.5 SENSITIVE Sensitive     ERYTHROMYCIN <=0.25 SENSITIVE  Sensitive     GENTAMICIN <=0.5 SENSITIVE Sensitive     OXACILLIN <=0.25 SENSITIVE Sensitive     TETRACYCLINE <=1 SENSITIVE Sensitive     VANCOMYCIN 1 SENSITIVE Sensitive     TRIMETH/SULFA <=10 SENSITIVE Sensitive     CLINDAMYCIN <=0.25 SENSITIVE Sensitive     RIFAMPIN <=0.5 SENSITIVE Sensitive     Inducible Clindamycin NEGATIVE Sensitive     * ABUNDANT STAPHYLOCOCCUS AUREUS  Culture, blood (Routine X 2) w Reflex to ID Panel     Status: Abnormal   Collection Time: 05/21/21 11:03 AM   Specimen: BLOOD  Result Value Ref Range Status   Specimen Description BLOOD BLOOD RIGHT HAND  Final   Special Requests   Final    BOTTLES DRAWN AEROBIC ONLY Blood Culture adequate volume   Culture  Setup Time   Final    GRAM POSITIVE COCCI IN CLUSTERS AEROBIC BOTTLE ONLY CRITICAL VALUE NOTED.  VALUE IS CONSISTENT WITH PREVIOUSLY REPORTED AND CALLED VALUE.    Culture (A)  Final    STAPHYLOCOCCUS EPIDERMIDIS SUSCEPTIBILITIES PERFORMED ON PREVIOUS CULTURE WITHIN THE LAST 5 DAYS. Performed at [facility], [place] [address], [place], [place] [address]    Report Status 05/25/2021 FINAL  Final  Culture, blood (Routine X 2) w Reflex to ID Panel     Status: Abnormal   Collection Time: 05/21/21 11:09 AM   Specimen: BLOOD  Result Value Ref Range Status   Specimen Description BLOOD BLOOD RIGHT HAND  Final   Special Requests   Final    BOTTLES DRAWN AEROBIC ONLY Blood Culture results may not be optimal due to an inadequate volume of blood received in culture bottles   Culture  Setup Time   Final    GRAM POSITIVE COCCI IN CLUSTERS AEROBIC BOTTLE ONLY CRITICAL RESULT CALLED TO, READ BACK BY AND VERIFIED WITH: PHARM D M.Berline Lopes ON [id number] AT 1050 BY E.PARRISH Performed at [facility], [place] [address] [place], [place] [address]    Culture (A)  Final    STAPHYLOCOCCUS EPIDERMIDIS STAPHYLOCOCCUS HOMINIS    Report Status 05/25/2021 FINAL  Final   Organism ID, Bacteria STAPHYLOCOCCUS EPIDERMIDIS  Final    Organism ID, Bacteria STAPHYLOCOCCUS HOMINIS  Final      Susceptibility   Staphylococcus  epidermidis - MIC*    CIPROFLOXACIN <=0.5 SENSITIVE Sensitive     ERYTHROMYCIN <=0.25 SENSITIVE Sensitive     GENTAMICIN <=0.5 SENSITIVE Sensitive     OXACILLIN <=0.25 SENSITIVE Sensitive     TETRACYCLINE <=1 SENSITIVE Sensitive     VANCOMYCIN 1 SENSITIVE Sensitive     TRIMETH/SULFA <=10 SENSITIVE Sensitive     CLINDAMYCIN <=0.25 SENSITIVE Sensitive     RIFAMPIN <=0.5 SENSITIVE Sensitive     Inducible Clindamycin NEGATIVE Sensitive     * STAPHYLOCOCCUS EPIDERMIDIS   Staphylococcus hominis - MIC*    CIPROFLOXACIN <=0.5 SENSITIVE Sensitive     ERYTHROMYCIN <=0.25 SENSITIVE Sensitive     GENTAMICIN <=0.5 SENSITIVE Sensitive     OXACILLIN RESISTANT Resistant     TETRACYCLINE <=1 SENSITIVE Sensitive     VANCOMYCIN <=0.5 SENSITIVE Sensitive     TRIMETH/SULFA <=10 SENSITIVE Sensitive     CLINDAMYCIN <=0.25 SENSITIVE Sensitive     RIFAMPIN <=0.5 SENSITIVE Sensitive     Inducible Clindamycin NEGATIVE Sensitive     * STAPHYLOCOCCUS HOMINIS  Blood Culture ID Panel (Reflexed)     Status: Abnormal   Collection Time: 05/21/21 11:09 AM  Result Value Ref Range Status   Enterococcus faecalis NOT DETECTED NOT DETECTED Final   Enterococcus Faecium NOT DETECTED NOT DETECTED Final   Listeria monocytogenes NOT DETECTED NOT DETECTED Final   Staphylococcus species DETECTED (A) NOT DETECTED Final    Comment: CRITICAL RESULT CALLED TO, READ BACK BY AND VERIFIED WITH: PHARM D M.TUCKER ON [id number] AT 1050 BY E.PARRISH    Staphylococcus aureus (BCID) NOT DETECTED NOT DETECTED Final   Staphylococcus epidermidis DETECTED (A) NOT DETECTED Final    Comment: Methicillin (oxacillin) resistant coagulase negative staphylococcus. Possible blood culture contaminant (unless isolated from more than one blood culture draw or clinical case suggests pathogenicity). No antibiotic treatment is indicated for blood  culture  contaminants. CRITICAL RESULT CALLED TO, READ BACK BY AND VERIFIED WITH: PHARM D M.TUCKER ON [phone] AT 1050 BY E.PARRISH    Staphylococcus lugdunensis NOT DETECTED NOT DETECTED Final   Streptococcus species NOT DETECTED NOT DETECTED Final   Streptococcus agalactiae NOT DETECTED NOT DETECTED Final   Streptococcus pneumoniae NOT DETECTED NOT DETECTED Final   Streptococcus pyogenes NOT DETECTED NOT DETECTED Final   A.calcoaceticus-baumannii NOT DETECTED NOT DETECTED Final   Bacteroides fragilis NOT DETECTED NOT DETECTED Final   Enterobacterales NOT DETECTED NOT DETECTED Final   Enterobacter cloacae complex NOT DETECTED NOT DETECTED Final   Escherichia coli NOT DETECTED NOT DETECTED Final   Klebsiella aerogenes NOT DETECTED NOT DETECTED Final   Klebsiella oxytoca NOT DETECTED NOT DETECTED Final   Klebsiella pneumoniae NOT DETECTED NOT DETECTED Final   Proteus species NOT DETECTED NOT DETECTED Final   Salmonella species NOT DETECTED NOT DETECTED Final   Serratia marcescens NOT DETECTED NOT DETECTED Final   Haemophilus influenzae NOT DETECTED NOT DETECTED Final   Neisseria meningitidis NOT DETECTED NOT DETECTED Final   Pseudomonas aeruginosa NOT DETECTED NOT DETECTED Final   Stenotrophomonas maltophilia NOT DETECTED NOT DETECTED Final   Candida albicans NOT DETECTED NOT DETECTED Final   Candida auris NOT DETECTED NOT DETECTED Final   Candida glabrata NOT DETECTED NOT DETECTED Final   Candida krusei NOT DETECTED NOT DETECTED Final   Candida parapsilosis NOT DETECTED NOT DETECTED Final   Candida tropicalis NOT DETECTED NOT DETECTED Final   Cryptococcus neoformans/gattii NOT DETECTED NOT DETECTED Final   Methicillin resistance mecA/C DETECTED (A) NOT DETECTED Final    Comment: CRITICAL  RESULT CALLED TO, READ BACK BY AND VERIFIED WITH: PHARM D M.Berline Lopes ON [phone] AT 1050 BY E.PARRISH Performed at [facility], [place] [address] [place], [place] [address]   Culture, Respiratory w Gram  Stain     Status: None   Collection Time: 05/28/21  7:43 AM   Specimen: Tracheal Aspirate; Respiratory  Result Value Ref Range Status   Specimen Description TRACHEAL ASPIRATE  Final   Special Requests NONE  Final   Gram Stain   Final    FEW WBC PRESENT,BOTH PMN AND MONONUCLEAR RARE GRAM POSITIVE COCCI IN PAIRS    Culture   Final    Normal respiratory flora-no Staph aureus or Pseudomonas seen Performed at [facility], [address] [place], [place] [address]    Report Status 05/30/2021 FINAL  Final  Culture, blood (routine x 2)     Status: Abnormal   Collection Time: 05/28/21  8:27 AM   Specimen: BLOOD  Result Value Ref Range Status   Specimen Description BLOOD RIGHT ANTECUBITAL  Final   Special Requests   Final    BOTTLES DRAWN AEROBIC AND ANAEROBIC Blood Culture adequate volume   Culture  Setup Time   Final    GRAM POSITIVE COCCI IN CLUSTERS AEROBIC BOTTLE ONLY CRITICAL VALUE NOTED.  VALUE IS CONSISTENT WITH PREVIOUSLY REPORTED AND CALLED VALUE. Performed at [facility], [place] [address], [place], [place] [address]    Culture STAPHYLOCOCCUS EPIDERMIDIS (A)  Final   Report Status 05/31/2021 FINAL  Final   Organism ID, Bacteria STAPHYLOCOCCUS EPIDERMIDIS  Final      Susceptibility   Staphylococcus epidermidis - MIC*    CIPROFLOXACIN >=8 RESISTANT Resistant     ERYTHROMYCIN >=8 RESISTANT Resistant     GENTAMICIN <=0.5 SENSITIVE Sensitive     OXACILLIN >=4 RESISTANT Resistant     TETRACYCLINE 2 SENSITIVE Sensitive     VANCOMYCIN <=0.5 SENSITIVE Sensitive     TRIMETH/SULFA 80 RESISTANT Resistant     CLINDAMYCIN >=8 RESISTANT Resistant     RIFAMPIN <=0.5 SENSITIVE Sensitive     Inducible Clindamycin NEGATIVE Sensitive     * STAPHYLOCOCCUS EPIDERMIDIS  Culture, blood (routine x 2)     Status: Abnormal   Collection Time: 05/28/21  8:28 AM   Specimen: BLOOD  Result Value Ref Range Status   Specimen Description BLOOD RIGHT ANTECUBITAL  Final   Special Requests    Final    BOTTLES DRAWN AEROBIC AND ANAEROBIC Blood Culture adequate volume   Culture  Setup Time   Final    GRAM POSITIVE COCCI IN CLUSTERS AEROBIC BOTTLE ONLY CRITICAL VALUE NOTED.  VALUE IS CONSISTENT WITH PREVIOUSLY REPORTED AND CALLED VALUE. Performed at [facility], [place] [address], [place], [place] [address]    Culture STAPHYLOCOCCUS EPIDERMIDIS (A)  Final   Report Status 06/02/2021 FINAL  Final   Organism ID, Bacteria STAPHYLOCOCCUS EPIDERMIDIS  Final      Susceptibility   Staphylococcus epidermidis - MIC*    CIPROFLOXACIN >=8 RESISTANT Resistant     ERYTHROMYCIN <=0.25 SENSITIVE Sensitive     GENTAMICIN <=0.5 SENSITIVE Sensitive     OXACILLIN >=4 RESISTANT Resistant     TETRACYCLINE 2 SENSITIVE Sensitive     VANCOMYCIN <=0.5 SENSITIVE Sensitive     TRIMETH/SULFA 80 RESISTANT Resistant     CLINDAMYCIN <=0.25 SENSITIVE Sensitive     RIFAMPIN <=0.5 SENSITIVE Sensitive     Inducible Clindamycin NEGATIVE Sensitive     * STAPHYLOCOCCUS EPIDERMIDIS  Studies/Results: No results found.    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 female with history of diabetes mellitus uterine fibroids paroxysmal supra ventricular tachycardia heart failure, paranoid schizophrenia  admitted with MCA stroke status post embolization complicated by contrast extravasation, with development of a subarachnoid hemorrhage.  He then was found to have a right basal ganglia and infarct with 3 mm leftward  shift.  She had respiratory failure on the 26 and was intubated.  Placement of tracheostomy tube and now has feeding tube in place.  She was treated for 7 days with cefazolin for MSSA pneumonia.   Terri Piedra and Dr. Linus Salmons had seen the patient yesterday and recommended observing her off antibiotics.  She had grown half epidermidis and staph hominis from blood cultures taken from the right hand on 30 May and staph epidermidis grown from a blood culture taken from the right hand on 30 May as well.  These were felt to represent contaminants though they were reported as being drawn from 2 different sites that are both labeled as the right hand.  Staph epidermidis was methicillin sensitive where is the Staph hominis was methicillin-resistant.  If 1 of these 2 organisms was actually a pathogen it would have to be the Staph epidermidis since it did grow from 2 sites.  I would have thought that would have been properly treated by her cefazolin.  Since then she has had repeat blood cultures done on 6 June that is again grown staph epidermidis from 2 different sites though the sensitivities are slightly different between the 2 organisms with 1 being clindamycin sensitive and the other 1 clindamycin resistant.  I was called back to see the patient because of her which are of 101 degrees yesterday and her worsening thrombocytosis.  From looking through the notes and talking to the nurse at the bedside the patient seems clinically overall to have improved.  I did order a chest x-ray to assess her lungs again though she seems to be doing better.  I will repeat blood cultures yet again and asked microbiology to double check the sensitivities on the 2 different Staph epidermidis species that were isolated  I spent more than 35 minutes with the patient including greater than 50% of time in face to face counseling of the patient personally reviewing radiographs, along with pertinent laboratory microbiological  data review of medical records and in coordination of her care.  ADDENDUM: I double checked with the microbiology lab and the microbiology technician I spoke with sets that the cultures from the 6 that were drawn had organisms that morphologically appeared differently and did have different susceptibility patterns with 1 having erythromycin and clindamycin susceptibility other not.  She was confident there different organisms since there are different organism they must both be contaminants.  Certainly hope the next set of blood cultures I ordered do not yet again grow staph epidermidis and that they are truly drawn from different sites so that we do not keep propagating ideas that she is  actually bacteremic.    LOS: 18 days   Alcide Evener 06/02/2021, 1:51 PM

## 2021-06-03 ENCOUNTER — Inpatient Hospital Stay (HOSPITAL_COMMUNITY): Payer: Medicaid Other

## 2021-06-03 DIAGNOSIS — K567 Ileus, unspecified: Secondary | ICD-10-CM

## 2021-06-03 DIAGNOSIS — R140 Abdominal distension (gaseous): Secondary | ICD-10-CM

## 2021-06-03 DIAGNOSIS — Z4659 Encounter for fitting and adjustment of other gastrointestinal appliance and device: Secondary | ICD-10-CM

## 2021-06-03 DIAGNOSIS — B957 Other staphylococcus as the cause of diseases classified elsewhere: Secondary | ICD-10-CM

## 2021-06-03 DIAGNOSIS — J15211 Pneumonia due to Methicillin susceptible Staphylococcus aureus: Secondary | ICD-10-CM

## 2021-06-03 LAB — GLUCOSE, CAPILLARY
Glucose-Capillary: 118 mg/dL — ABNORMAL HIGH (ref 70–99)
Glucose-Capillary: 104 mg/dL — ABNORMAL HIGH (ref 70–99)
Glucose-Capillary: 111 mg/dL — ABNORMAL HIGH (ref 70–99)
Glucose-Capillary: 106 mg/dL — ABNORMAL HIGH (ref 70–99)
Glucose-Capillary: 121 mg/dL — ABNORMAL HIGH (ref 70–99)

## 2021-06-03 LAB — COMPREHENSIVE METABOLIC PANEL
ALT: 14 U/L (ref 0–44)
AST: 38 U/L (ref 15–41)
Albumin: 2.2 g/dL — ABNORMAL LOW (ref 3.5–5.0)
Alkaline Phosphatase: 163 U/L — ABNORMAL HIGH (ref 38–126)
Anion gap: 9 (ref 5–15)
BUN: 20 mg/dL (ref 6–20)
CO2: 19 mmol/L — ABNORMAL LOW (ref 22–32)
Calcium: 9.6 mg/dL (ref 8.9–10.3)
Chloride: 108 mmol/L (ref 98–111)
Creatinine, Ser: 1.09 mg/dL — ABNORMAL HIGH (ref 0.44–1.00)
GFR, Estimated: >60 mL/min (ref >60)
Glucose, Bld: 81 mg/dL (ref 70–99)
Potassium: 5.2 mmol/L — ABNORMAL HIGH (ref 3.5–5.1)
Sodium: 136 mmol/L (ref 135–145)
Total Bilirubin: 0.2 mg/dL — ABNORMAL LOW (ref 0.3–1.2)
Total Protein: 6.4 g/dL — ABNORMAL LOW (ref 6.5–8.1)

## 2021-06-03 LAB — CBC
HCT: 29 % — ABNORMAL LOW (ref 36.0–46.0)
Hemoglobin: 8.6 g/dL — ABNORMAL LOW (ref 12.0–15.0)
MCH: 23.5 pg — ABNORMAL LOW (ref 26.0–34.0)
MCHC: 29.7 g/dL — ABNORMAL LOW (ref 30.0–36.0)
MCV: 79.2 fL — ABNORMAL LOW (ref 80.0–100.0)
Platelets: 693 10*3/uL — ABNORMAL HIGH (ref 150–400)
RBC: 3.66 MIL/uL — ABNORMAL LOW (ref 3.87–5.11)
RDW: 31.3 % — ABNORMAL HIGH (ref 11.5–15.5)
WBC: 11.5 10*3/uL — ABNORMAL HIGH (ref 4.0–10.5)
nRBC: 0 % (ref 0.0–0.2)

## 2021-06-03 LAB — D-DIMER, QUANTITATIVE: D-Dimer, Quant: 8.53 ug/mL-FEU — ABNORMAL HIGH (ref 0.00–0.50)

## 2021-06-03 IMAGING — CT CT ANGIO CHEST
2 of 7 series · 16 of 46 positions shown · IV contrast (omnipaque)
Comparison: [DATE].

CLINICAL DATA: Shortness of breath

EXAM:
CT ANGIOGRAPHY CHEST WITH CONTRAST
TECHNIQUE: Multidetector CT imaging of the chest was performed using the
standard protocol during bolus administration of intravenous
contrast. Multiplanar CT image reconstructions and MIPs were
obtained to evaluate the vascular anatomy.
CONTRAST:  50mL OMNIPAQUE IOHEXOL 350 MG/ML SOLN

[Series 7: thins · axial · 0.48mm/px · z∈[+1156,+1448]mm · 13 of 468 slices shown]
[im 26/468  lung]
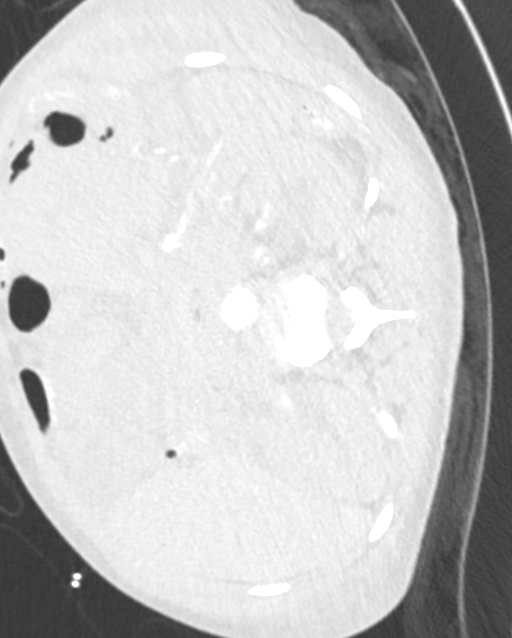
[im 52/468  soft-tissue]
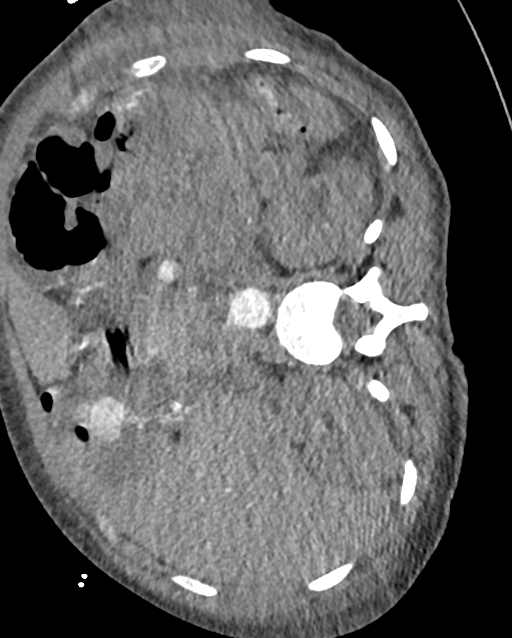
[im 104/468  lung]
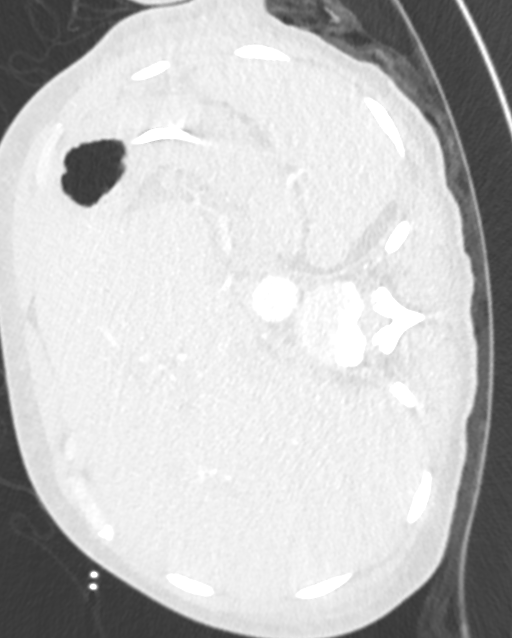
[im 130/468  soft-tissue]
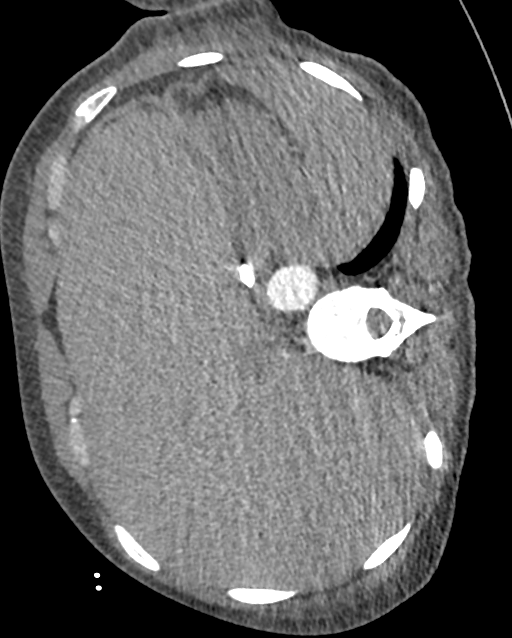
[im 156/468  lung]
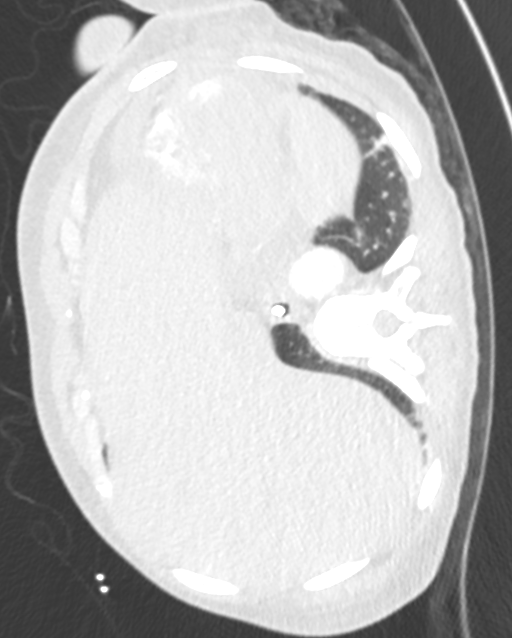
[im 208/468  soft-tissue]
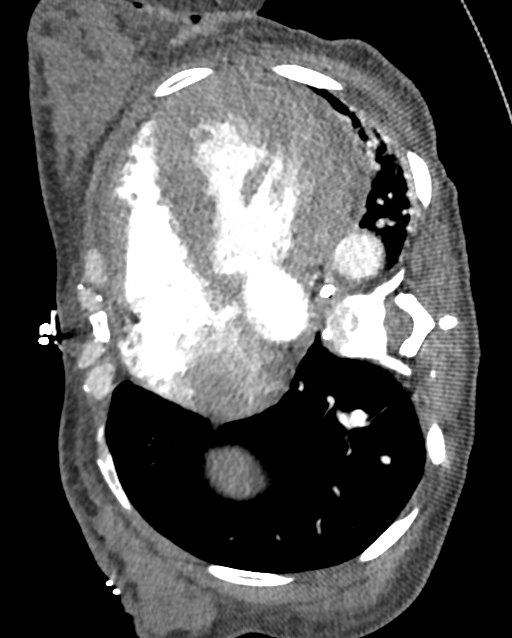
[im 234/468  lung]
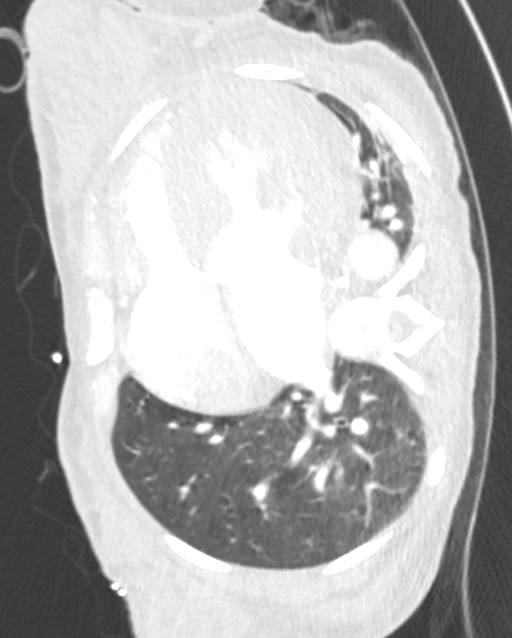
[im 260/468  soft-tissue]
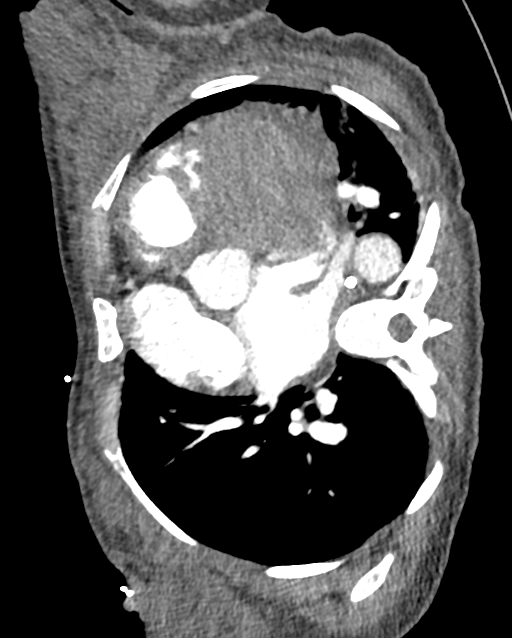
[im 312/468  lung]
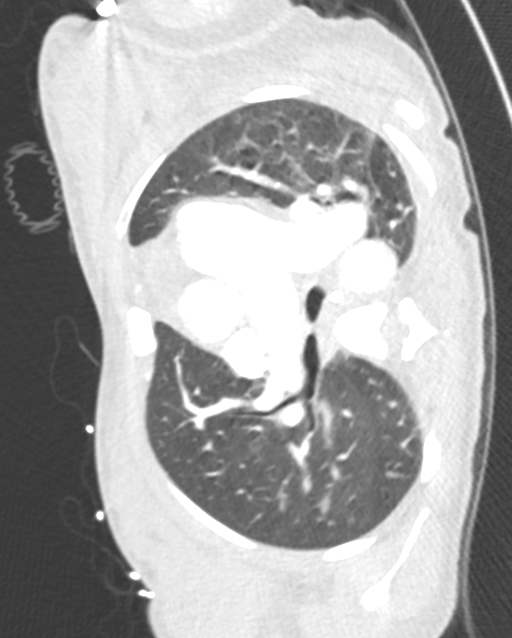
[im 338/468  soft-tissue]
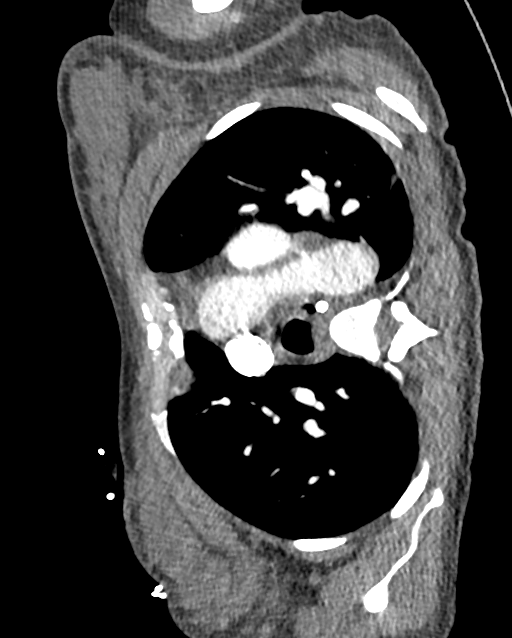
[im 364/468  lung]
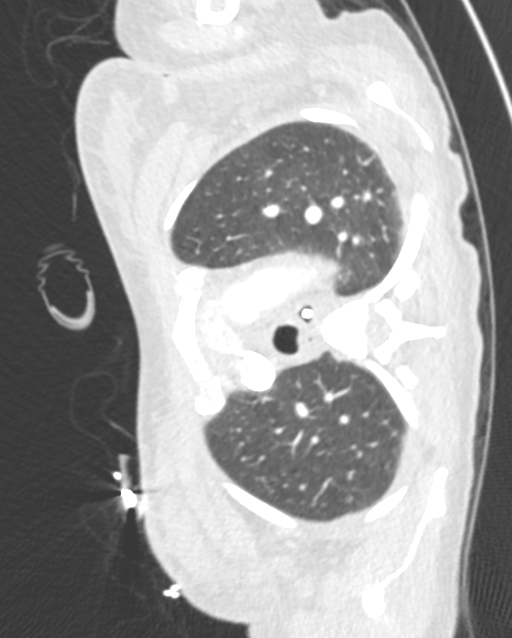
[im 416/468  soft-tissue]
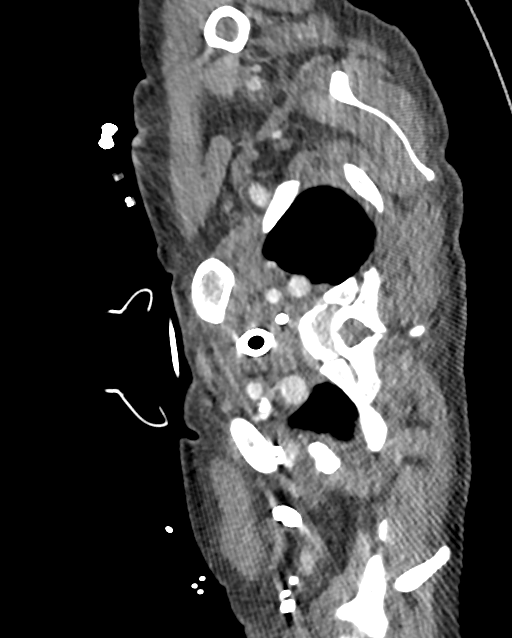
[im 442/468  lung]
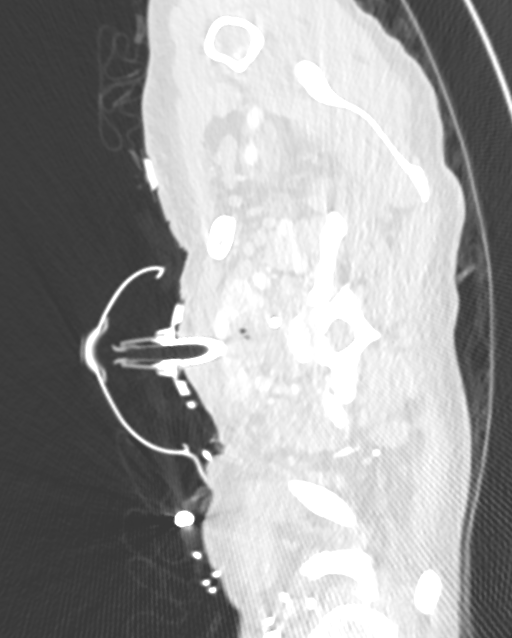

[Series 8: cor · coronal · 0.64mm/px · 3 of 106 slices shown]
[im 27/106  soft-tissue]
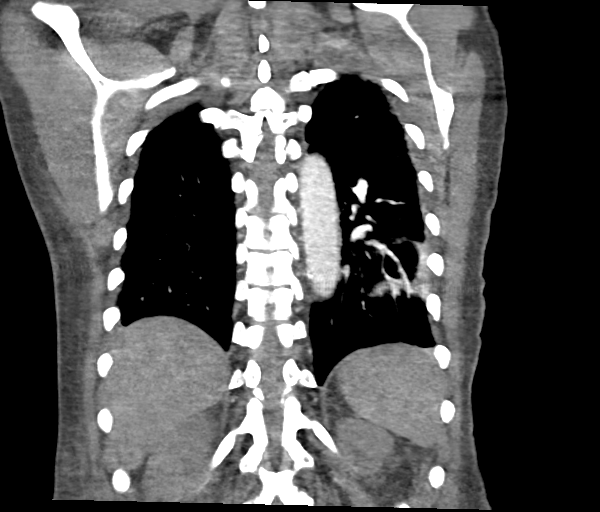
[im 53/106  soft-tissue]
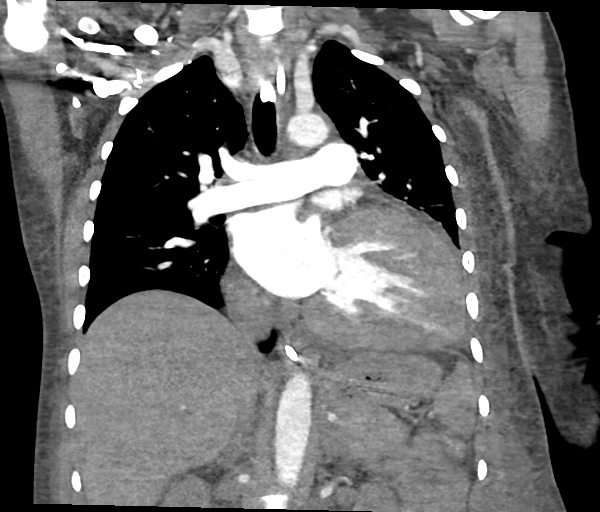
[im 79/106  soft-tissue]
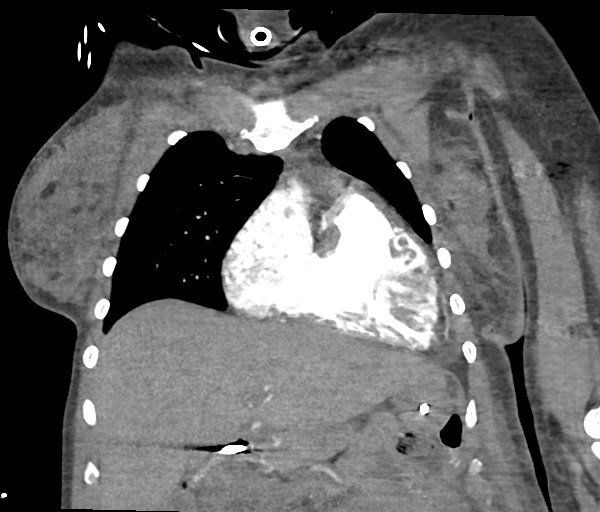

[16 of 46 positions shown; findings below may reference images not displayed]

FINDINGS: Cardiovascular: Satisfactory opacification of the pulmonary arteries
to the segmental level. No evidence of pulmonary embolism.
Cardiomegaly. Small pericardial effusion.

Mediastinum/Nodes: Scattered thyroid nodules, the largest of which
measures 12 mm. No mediastinal hilar adenopathy. No axillary
adenopathy.

Lungs/Pleura: Tracheostomy. LEFT basilar atelectasis. No pleural
effusion or pneumothorax.

Upper Abdomen: Enteric tube tip terminates in the proximal duodenum.

Musculoskeletal: No chest wall abnormality. No acute or significant
osseous findings.

Review of the MIP images confirms the above findings.
IMPRESSION: 1. No evidence of pulmonary embolism.
2. Cardiomegaly.
3. Scattered thyroid nodules. Not clinically significant; no
follow-up imaging recommended (ref: [HOSPITAL]. [[facility]]

## 2021-06-03 MED ORDER — SODIUM CHLORIDE 0.9% FLUSH
10.0000 mL | Freq: Two times a day (BID) | INTRAVENOUS | Status: DC
  Administered 2021-06-03 – 2021-06-05 (×5): 10 mL
  Administered 2021-06-06: 30 mL
  Administered 2021-06-06 – 2021-07-03 (×49): 10 mL

## 2021-06-03 MED ORDER — SODIUM CHLORIDE 0.9% FLUSH: 10.0000 mL | INTRAVENOUS | Status: DC | PRN

## 2021-06-03 MED ORDER — IOHEXOL 350 MG/ML SOLN
50.0000 mL | Freq: Once | INTRAVENOUS | Status: AC | PRN
  Administered 2021-06-03: 50 mL via INTRAVENOUS

## 2021-06-03 NOTE — Progress Notes (Signed)
PROGRESS NOTE    Holly Bradley   EGB:[id number]  DOB: 1976/05/22  DOA: 05/15/2021 PCP: Mitzi Hansen, MD   Brief Narrative:  Holly Bradley is a 45  Y/o with HTN, DM 2, large uterine fibroids, pSVT, HFpEF, paranoid psychosis who presented on 5/24 with confusion.  She was found to have a right MCA M1 occlusion and underwent a thrombectomy which was complicated by contrast extravasation and M3 coil embolization and revascularization. She developed a subarachnoid hemorrhage. Repeat imaging reveal a right basal ganglia infarct with a 3 mm right to left shift. She was started on hypertonic saline. On 5/26 she developed respiratory distress and was intubated. On 5/26 a head CT showed increased cytotoxic edema and a left sided shift with was 5 mm. She has not had improvement in mental status and now has a trach. She continues to require tube feeds. She has been treated for MSSA pneumonia with Ancef (course complted on 6/7)  Blood cultures on 5/30 noted to be positive for staph Epi. These were repeated and remain positive. Ancef has been resumed.    Subjective: She has no complaints.     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 complicated by ruptured right M3 causing which was successfully embolized Dysphagia - awake minimally communicative with trach and tube feeds - hypercoagulable w/u as follows: -Anticardiolipin IgM is 17, DRVVT 80.2 (range from moderate is 20-80)- have has stroke team to review findings - Family would like PEG tube- general surgery following with a plan for open G tube due to extensive fibroids and difficult anatomy - cont Lipitor and ASA 81 mg  Active Problems:   Acute respiratory failure with hypoxia  - continue Trach per PCCM- they plan to see Tues and Thurs for trach care  MSSA pneumonia - 1 wk of antibiotics completed on 6/6  Staph epidermidis bacteremia, fever, leucocytosis> sepsis - noted on  blood cultures from 5/30 and again on repeat culture from 6/6 -   Ancef resumed 6/8-6/9 -  based on sensitivities, culture are growing 2 different types of staph epi (one is a MRSE) and also staph hominis  - ID recommends holding off on Abx as cultures likely contaminated - last elevated tem temp 101 ton 6/10  - UA negative- check CTA (for infection and PE) - she is tachycardic, has tachypnea- Lactic acid is normal   Thrombocytosis - following- it is rising daily- likley as an acute phase reactant which is why I am continuing further work up     Fibroids - extremely large fibroids palpable on exam - she had some vaginal bleeding & OB/GYN evaluated her on 3/31 and recommended Megace 80 mg TID to stop the bleeding (on a lower dose a outpt)- also stated that she is currently not a surgical candidate - I spoke with Dr Elly Modena and she recommended to continue Megace- A fibroidectomy can be revisited in the future     Iron deficiency anemia due to chronic blood loss - Hb ~ 7- 8 - resumed oral Iron - following Hb intermittently     Chronic systolic and diastolic CHF  Difficult to control HTN - ECHO on 5/25 reveals that EF has improved to normal and she has grade 2 d CHF - cont Entresto, Imdur, Hydralazine, Clonidine and Coreg  - Demadex on hold  - following fluid status  Nutrition - cont the following per nutrition note>  osmolite 1.5 @ 50 cc /hr  Prosource TF 45 ml BID 200 cc freee water Q 6 hrs for a total of 1512 cc/day  H/o PSVT - cont Amiodarone (home med)   Tobacco abuse - prior to admission   Time spent in minutes: 35 DVT prophylaxis: SCD's Start: 05/15/21 0753 Code Status: no CPR Family Communication:  Level of Care: Level of care: Med-Surg Disposition Plan:  Status is: Inpatient  Remains inpatient appropriate because:IV treatments appropriate due to intensity of illness or inability to take PO   Dispo: The patient is from: Home              Anticipated d/c is to:   TBD              Patient currently is not medically stable to d/c.   Difficult to place patient Yes   Consultants:  Neurology PCCM Palliative care IR Procedures:  thrombectomy Antimicrobials:  Anti-infectives (From admission, onward)    Start     Dose/Rate Route Frequency Ordered Stop   05/30/21 1515  ceFAZolin (ANCEF) IVPB 2g/100 mL premix  Status:  Discontinued        2 g 200 mL/hr over 30 Minutes Intravenous Every 8 hours 05/30/21 1415 05/31/21 1404   05/21/21 1100  ceFAZolin (ANCEF) IVPB 2g/100 mL premix        2 g 200 mL/hr over 30 Minutes Intravenous Every 8 hours 05/21/21 1030 05/28/21 0537        Objective: Vitals:   06/03/21 0906 06/03/21 0917 06/03/21 1141 06/03/21 1159  BP: (!) 152/78 (!) 152/78 131/69 128/67  Pulse: (!) 105 (!) 105 84 88  Resp: (!) '21 17 15 15  ' Temp:  99.7 F (37.6 C)  99.3 F (37.4 C)  TempSrc:    Oral  SpO2: 100% 99% 100% 100%  Weight:      Height:        Intake/Output Summary (Last 24 hours) at 06/03/2021 1607 Last data filed at 06/03/2021 1300 Gross per 24 hour  Intake --  Output 1825 ml  Net -1825 ml    Filed Weights   05/30/21 0322 05/31/21 0355 05/31/21 1528  Weight: 74 kg 80.2 kg 76.6 kg    Examination: General exam: Appears comfortable  HEENT: PERRL, oral mucosa moist, no sclera icterus or thrush trach and cor trac in place Respiratory system: Clear to auscultation. Respiratory effort normal. Cardiovascular system: S1 & S2 heard, regular rate and rhythm Gastrointestinal system: Abdomen soft, non-tender,  multiple large firm masses present. Normal bowel sounds   Central nervous system: Alert - left sided hemiplegia Extremities: No cyanosis, clubbing or edema Skin: No rashes or ulcer    Data Reviewed: I have personally reviewed following labs and imaging studies  CBC: Recent Labs  Lab 05/30/21 1041 05/31/21 0314 06/01/21 0255 06/01/21 0640 06/02/21 0306 06/03/21 0903  WBC 13.2* 13.1* 11.4*  --  11.5* 11.5*   NEUTROABS  --  11.1*  --   --   --   --   HGB 7.9* 8.0* 7.8* 8.1* 7.9* 8.6*  HCT 27.4* 27.2* 26.9* 28.3* 27.0* 29.0*  MCV 76.3* 76.6* 77.5*  --  78.3* 79.2*  PLT 501* 533* 577*  --  638* 693*    Basic Metabolic Panel: Recent Labs  Lab 05/29/21 0254 05/30/21 1041 05/31/21 0314 06/01/21 0255 06/02/21 0306  NA 137 139 137 135 137  K 4.4 4.7 4.6 4.6 4.6  CL 108 111 107 107 108  CO2 21* 22 20* 20* 19*  GLUCOSE 112* 118* 113*  110* 125*  BUN 25* '20 19 18 20  ' CREATININE 1.18* 1.17* 1.12* 1.04* 1.14*  CALCIUM 9.3 9.0 9.0 9.1 9.3    GFR: Estimated Creatinine Clearance: 62.4 mL/min (A) (by C-G formula based on SCr of 1.14 mg/dL (H)). Liver Function Tests: No results for input(s): AST, ALT, ALKPHOS, BILITOT, PROT, ALBUMIN in the last 168 hours. No results for input(s): LIPASE, AMYLASE in the last 168 hours. No results for input(s): AMMONIA in the last 168 hours. Coagulation Profile: No results for input(s): INR, PROTIME in the last 168 hours. Cardiac Enzymes: No results for input(s): CKTOTAL, CKMB, CKMBINDEX, TROPONINI in the last 168 hours. BNP (last 3 results) No results for input(s): PROBNP in the last 8760 hours. HbA1C: No results for input(s): HGBA1C in the last 72 hours. CBG: Recent Labs  Lab 06/02/21 1630 06/02/21 2027 06/03/21 0344 06/03/21 0729 06/03/21 1236  GLUCAP 109* 114* 118* 104* 111*    Lipid Profile: No results for input(s): CHOL, HDL, LDLCALC, TRIG, CHOLHDL, LDLDIRECT in the last 72 hours. Thyroid Function Tests: No results for input(s): TSH, T4TOTAL, FREET4, T3FREE, THYROIDAB in the last 72 hours. Anemia Panel: No results for input(s): VITAMINB12, FOLATE, FERRITIN, TIBC, IRON, RETICCTPCT in the last 72 hours. Urine analysis:    Component Value Date/Time   COLORURINE YELLOW 06/01/2021 2051   APPEARANCEUR HAZY (A) 06/01/2021 2051   LABSPEC 1.012 06/01/2021 2051   PHURINE 7.0 06/01/2021 2051   GLUCOSEU NEGATIVE 06/01/2021 2051   HGBUR MODERATE (A)  06/01/2021 2051   BILIRUBINUR NEGATIVE 06/01/2021 2051   [place] 06/01/2021 2051   PROTEINUR 30 (A) 06/01/2021 2051   NITRITE NEGATIVE 06/01/2021 2051   LEUKOCYTESUR SMALL (A) 06/01/2021 2051   Sepsis Labs: '@LABRCNTIP' (procalcitonin:4,lacticidven:4) ) Recent Results (from the past 240 hour(s))  Culture, Respiratory w Gram Stain     Status: None   Collection Time: 05/28/21  7:43 AM   Specimen: Tracheal Aspirate; Respiratory  Result Value Ref Range Status   Specimen Description TRACHEAL ASPIRATE  Final   Special Requests NONE  Final   Gram Stain   Final    FEW WBC PRESENT,BOTH PMN AND MONONUCLEAR RARE GRAM POSITIVE COCCI IN PAIRS    Culture   Final    Normal respiratory flora-no Staph aureus or Pseudomonas seen Performed at [facility], [address] [place], [place] [address]    Report Status 05/30/2021 FINAL  Final  Culture, blood (routine x 2)     Status: Abnormal   Collection Time: 05/28/21  8:27 AM   Specimen: BLOOD  Result Value Ref Range Status   Specimen Description BLOOD RIGHT ANTECUBITAL  Final   Special Requests   Final    BOTTLES DRAWN AEROBIC AND ANAEROBIC Blood Culture adequate volume   Culture  Setup Time   Final    GRAM POSITIVE COCCI IN CLUSTERS AEROBIC BOTTLE ONLY CRITICAL VALUE NOTED.  VALUE IS CONSISTENT WITH PREVIOUSLY REPORTED AND CALLED VALUE. Performed at [facility], [place] [address] [place], [place] [address]    Culture STAPHYLOCOCCUS EPIDERMIDIS (A)  Final   Report Status 05/31/2021 FINAL  Final   Organism ID, Bacteria STAPHYLOCOCCUS EPIDERMIDIS  Final      Susceptibility   Staphylococcus epidermidis - MIC*    CIPROFLOXACIN >=8 RESISTANT Resistant     ERYTHROMYCIN >=8 RESISTANT Resistant     GENTAMICIN <=0.5 SENSITIVE Sensitive     OXACILLIN >=4 RESISTANT Resistant     TETRACYCLINE 2 SENSITIVE Sensitive     VANCOMYCIN <=0.5 SENSITIVE  Sensitive     TRIMETH/SULFA 80 RESISTANT Resistant     CLINDAMYCIN >=8 RESISTANT  Resistant     RIFAMPIN <=0.5 SENSITIVE Sensitive     Inducible Clindamycin NEGATIVE Sensitive     * STAPHYLOCOCCUS EPIDERMIDIS  Culture, blood (routine x 2)     Status: Abnormal   Collection Time: 05/28/21  8:28 AM   Specimen: BLOOD  Result Value Ref Range Status   Specimen Description BLOOD RIGHT ANTECUBITAL  Final   Special Requests   Final    BOTTLES DRAWN AEROBIC AND ANAEROBIC Blood Culture adequate volume   Culture  Setup Time   Final    GRAM POSITIVE COCCI IN CLUSTERS AEROBIC BOTTLE ONLY CRITICAL VALUE NOTED.  VALUE IS CONSISTENT WITH PREVIOUSLY REPORTED AND CALLED VALUE. Performed at [facility], [place] [address] [place] [address], [place], [place] [address]    Culture STAPHYLOCOCCUS EPIDERMIDIS (A)  Final   Report Status 06/02/2021 FINAL  Final   Organism ID, Bacteria STAPHYLOCOCCUS EPIDERMIDIS  Final      Susceptibility   Staphylococcus epidermidis - MIC*    CIPROFLOXACIN >=8 RESISTANT Resistant     ERYTHROMYCIN <=0.25 SENSITIVE Sensitive     GENTAMICIN <=0.5 SENSITIVE Sensitive     OXACILLIN >=4 RESISTANT Resistant     TETRACYCLINE 2 SENSITIVE Sensitive     VANCOMYCIN <=0.5 SENSITIVE Sensitive     TRIMETH/SULFA 80 RESISTANT Resistant     CLINDAMYCIN <=0.25 SENSITIVE Sensitive     RIFAMPIN <=0.5 SENSITIVE Sensitive     Inducible Clindamycin NEGATIVE Sensitive     * STAPHYLOCOCCUS EPIDERMIDIS  Culture, blood (Routine X 2) w Reflex to ID Panel     Status: None (Preliminary result)   Collection Time: 06/02/21  2:17 PM   Specimen: BLOOD LEFT HAND  Result Value Ref Range Status   Specimen Description BLOOD LEFT HAND  Final   Special Requests   Final    BOTTLES DRAWN AEROBIC AND ANAEROBIC Blood Culture adequate volume   Culture   Final    NO GROWTH < 24 HOURS Performed at [facility], [address] [place], [place] [address]    Report Status PENDING  Incomplete  Culture, blood (Routine X 2) w Reflex to ID Panel     Status: None (Preliminary result)   Collection Time:  06/02/21  2:22 PM   Specimen: BLOOD  Result Value Ref Range Status   Specimen Description BLOOD LEFT ANTECUBITAL  Final   Special Requests   Final    BOTTLES DRAWN AEROBIC AND ANAEROBIC Blood Culture adequate volume   Culture   Final    NO GROWTH < 24 HOURS Performed at [facility], [place] [address], [place], [place] [address]    Report Status PENDING  Incomplete         Radiology Studies: DG CHEST PORT 1 VIEW  Result Date: 06/02/2021 CLINICAL DATA:  Fever. EXAM: PORTABLE CHEST 1 VIEW COMPARISON:  05/30/2021 FINDINGS: Stable enlarged cardiac silhouette. Tracheostomy tube in satisfactory position. Feeding tube extending into the stomach. Dense left lower lobe airspace consolidation is again demonstrated. Mild patchy density at the medial right lung base. Unremarkable bones. IMPRESSION: 1. Stable dense left lower lobe atelectasis or pneumonia. 2. Interval mild patchy atelectasis or pneumonia at the medial right lung base. 3. Stable cardiomegaly. Electronically Signed   By: Claudie Revering M.D.   On: 06/02/2021 15:55       Scheduled Meds:  amiodarone  200 mg Per Tube Daily   amLODipine  10  mg Per Tube Daily   aspirin  81 mg Per Tube Daily   atorvastatin  40 mg Per Tube Daily   carvedilol  25 mg Per Tube BID WC   chlorhexidine gluconate (MEDLINE KIT)  15 mL Mouth Rinse BID   Chlorhexidine Gluconate Cloth  6 each Topical Daily   cloNIDine  0.1 mg Per Tube TID   feeding supplement (PROSource TF)  45 mL Per Tube BID   ferrous sulfate  300 mg Per Tube BID WC   free water  200 mL Per Tube Q6H   hydrALAZINE  100 mg Per Tube Q8H   isosorbide dinitrate  40 mg Per Tube TID   mouth rinse  15 mL Mouth Rinse 10 times per day   megestrol  80 mg Per Tube TID   multivitamin with minerals  1 tablet Per Tube Daily   pantoprazole sodium  40 mg Per Tube Daily   sacubitril-valsartan  1 tablet Per Tube BID   Continuous Infusions:  feeding supplement (OSMOLITE 1.5 CAL) 1,000 mL (06/02/21 1455)      LOS: 19 days      Debbe Odea, MD [facility] Pager: [email or web address] 06/03/2021, 4:07 PM

## 2021-06-03 NOTE — Plan of Care (Signed)
  Problem: Education: Goal: Knowledge of disease or condition will improve 06/03/2021 0733 by Delia Chimes, RN Outcome: Progressing 06/02/2021 1807 by Delia Chimes, RN Outcome: Progressing Goal: Knowledge of secondary prevention will improve 06/03/2021 0733 by Delia Chimes, RN Outcome: Progressing 06/02/2021 1807 by Delia Chimes, RN Outcome: Progressing   Problem: Education: Goal: Knowledge of General Education information will improve Description: Including pain rating scale, medication(s)/side effects and non-pharmacologic comfort measures 06/03/2021 0733 by Delia Chimes, RN Outcome: Progressing 06/02/2021 1807 by Delia Chimes, RN Outcome: Progressing   Problem: Health Behavior/Discharge Planning: Goal: Ability to manage health-related needs will improve 06/03/2021 0733 by Delia Chimes, RN Outcome: Progressing 06/02/2021 1807 by Delia Chimes, RN Outcome: Progressing   Problem: Clinical Measurements: Goal: Ability to maintain clinical measurements within normal limits will improve 06/03/2021 0733 by Delia Chimes, RN Outcome: Progressing 06/02/2021 1807 by Delia Chimes, RN Outcome: Progressing Goal: Will remain free from infection 06/03/2021 0733 by Delia Chimes, RN Outcome: Progressing 06/02/2021 1807 by Delia Chimes, RN Outcome: Progressing Goal: Diagnostic test results will improve 06/03/2021 0733 by Delia Chimes, RN Outcome: Progressing 06/02/2021 1807 by Delia Chimes, RN Outcome: Progressing Goal: Respiratory complications will improve 06/03/2021 0733 by Delia Chimes, RN Outcome: Progressing 06/02/2021 1807 by Delia Chimes, RN Outcome: Progressing Goal: Cardiovascular complication will be avoided 06/03/2021 0733 by Delia Chimes, RN Outcome: Progressing 06/02/2021 1807 by Delia Chimes, RN Outcome: Progressing   Problem: Activity: Goal: Risk for activity intolerance will decrease 06/03/2021 0733 by Delia Chimes, RN Outcome: Progressing 06/02/2021 1807 by Delia Chimes, RN Outcome: Progressing   Problem: Nutrition: Goal: Adequate nutrition will be maintained 06/03/2021 0733 by Delia Chimes, RN Outcome: Progressing 06/02/2021 1807 by Delia Chimes, RN Outcome: Progressing   Problem: Coping: Goal: Level of anxiety will decrease 06/03/2021 0733 by Delia Chimes, RN Outcome: Progressing 06/02/2021 1807 by Delia Chimes, RN Outcome: Progressing   Problem: Elimination: Goal: Will not experience complications related to bowel motility 06/03/2021 0733 by Delia Chimes, RN Outcome: Progressing 06/02/2021 1807 by Delia Chimes, RN Outcome: Progressing Goal: Will not experience complications related to urinary retention 06/03/2021 0733 by Delia Chimes, RN Outcome: Progressing 06/02/2021 1807 by Delia Chimes, RN Outcome: Progressing   Problem: Pain Managment: Goal: General experience of comfort will improve 06/03/2021 0733 by Delia Chimes, RN Outcome: Progressing 06/02/2021 1807 by Delia Chimes, RN Outcome: Progressing   Problem: Safety: Goal: Ability to remain free from injury will improve 06/03/2021 0733 by Delia Chimes, RN Outcome: Progressing 06/02/2021 1807 by Delia Chimes, RN Outcome: Progressing   Problem: Skin Integrity: Goal: Risk for impaired skin integrity will decrease 06/03/2021 0733 by Delia Chimes, RN Outcome: Progressing 06/02/2021 1807 by Delia Chimes, RN Outcome: Progressing

## 2021-06-03 NOTE — Progress Notes (Signed)
Subjective:  No new complaints when I asked the patient several questions at the bedside   Antibiotics:  Anti-infectives (From admission, onward)    Start     Dose/Rate Route Frequency Ordered Stop   05/30/21 1515  ceFAZolin (ANCEF) IVPB 2g/100 mL premix  Status:  Discontinued        2 g 200 mL/hr over 30 Minutes Intravenous Every 8 hours 05/30/21 1415 05/31/21 1404   05/21/21 1100  ceFAZolin (ANCEF) IVPB 2g/100 mL premix        2 g 200 mL/hr over 30 Minutes Intravenous Every 8 hours 05/21/21 1030 05/28/21 0537       Medications: Scheduled Meds:  amiodarone  200 mg Per Tube Daily   amLODipine  10 mg Per Tube Daily   aspirin  81 mg Per Tube Daily   atorvastatin  40 mg Per Tube Daily   carvedilol  25 mg Per Tube BID WC   chlorhexidine gluconate (MEDLINE KIT)  15 mL Mouth Rinse BID   Chlorhexidine Gluconate Cloth  6 each Topical Daily   cloNIDine  0.1 mg Per Tube TID   feeding supplement (PROSource TF)  45 mL Per Tube BID   ferrous sulfate  300 mg Per Tube BID WC   free water  200 mL Per Tube Q6H   hydrALAZINE  100 mg Per Tube Q8H   isosorbide dinitrate  40 mg Per Tube TID   mouth rinse  15 mL Mouth Rinse 10 times per day   megestrol  80 mg Per Tube TID   multivitamin with minerals  1 tablet Per Tube Daily   pantoprazole sodium  40 mg Per Tube Daily   sacubitril-valsartan  1 tablet Per Tube BID   Continuous Infusions:  feeding supplement (OSMOLITE 1.5 CAL) 1,000 mL (06/02/21 1455)   PRN Meds:.acetaminophen **OR** acetaminophen (TYLENOL) oral liquid 160 mg/5 mL **OR** acetaminophen, loperamide HCl, simethicone    Objective: Weight change:   Intake/Output Summary (Last 24 hours) at 06/03/2021 1443 Last data filed at 06/03/2021 1300 Gross per 24 hour  Intake --  Output 1825 ml  Net -1825 ml    Blood pressure 128/67, pulse 88, temperature 99.3 F (37.4 C), temperature source Oral, resp. rate 15, height _0  (1.651 m), weight 76.6 kg, SpO2 100 %. Temp:   [98.2 F (36.8 C)-99.7 F (37.6 C)] 99.3 F (37.4 C) (06/12 1159) Pulse Rate:  [84-105] 88 (06/12 1159) Resp:  [15-22] 15 (06/12 1159) BP: (128-152)/(66-78) 128/67 (06/12 1159) SpO2:  [98 %-100 %] 100 % (06/12 1159) FiO2 (%):  [21 %] 21 % (06/12 1141)  Physical Exam: Physical Exam HENT:     Head: Normocephalic and atraumatic.  Eyes:     Extraocular Movements: Extraocular movements intact.     Conjunctiva/sclera: Conjunctivae normal.  Neck:     Trachea: Tracheostomy present.  Cardiovascular:     Rate and Rhythm: Tachycardia present.     Heart sounds: No murmur heard.   No gallop.  Pulmonary:     Effort: No respiratory distress.     Breath sounds: No stridor. Rhonchi present. No wheezing.  Abdominal:     General: There is distension.     Palpations: There is mass.     Tenderness: There is no abdominal tenderness.  Musculoskeletal:        General: No swelling or tenderness.  Skin:    General: Skin is warm and dry.  Neurological:     Mental Status: She  is alert.    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     Sedimentation Rate No results for input(s): ESRSEDRATE in the last 72 hours. C-Reactive Protein No results for input(s): CRP in the last 72 hours.  Micro Results: Recent Results (from the past 720 hour(s))  Resp Panel by RT-PCR (Flu A&B, Covid) Nasopharyngeal Swab     Status: None   Collection Time: 05/15/21  4:43 AM   Specimen: Nasopharyngeal Swab; Nasopharyngeal(NP) swabs in vial transport medium  Result Value Ref Range Status   SARS Coronavirus 2 by RT PCR NEGATIVE NEGATIVE  Final    Comment: (NOTE) SARS-CoV-2 target nucleic acids are NOT DETECTED.  The SARS-CoV-2 RNA is generally detectable in upper respiratory specimens during the acute phase of infection. The lowest concentration of SARS-CoV-2 viral copies this assay can detect is 138 copies/mL. A negative result does not preclude SARS-Cov-2 infection and should not be used as the sole basis for treatment or other patient management decisions. A negative result may occur with  improper specimen collection/handling, submission of specimen other than nasopharyngeal swab, presence of viral mutation(s) within the areas targeted by this assay, and inadequate number of viral copies(<138 copies/mL). A negative result must be combined with clinical observations, patient history, and epidemiological information. The expected result is Negative.  Fact Sheet for Patients:  EntrepreneurPulse.com.au  Fact Sheet for Healthcare Providers:  IncredibleEmployment.be  This test is no t yet approved or cleared by the [place] FDA and  has been authorized for detection and/or diagnosis of SARS-CoV-2 by FDA under an Emergency Use Authorization (EUA). This EUA will remain  in effect (meaning this test can be used) for the duration of the COVID-19 declaration under Section 564(b)(1) of the Act, 21 U.S.C.section 360bbb-3(b)(1), unless the authorization is terminated  or revoked sooner.       Influenza A by PCR NEGATIVE NEGATIVE Final   Influenza B by PCR NEGATIVE NEGATIVE Final    Comment: (NOTE) The Xpert Xpress SARS-CoV-2/FLU/RSV plus assay is intended as an aid in the diagnosis of influenza from Nasopharyngeal swab specimens and should not be used as a sole basis for treatment. Nasal washings and aspirates are unacceptable for Xpert Xpress SARS-CoV-2/FLU/RSV testing.  Fact Sheet for Patients: EntrepreneurPulse.com.au  Fact Sheet for Healthcare  Providers: IncredibleEmployment.be  This test is not yet approved or cleared by the [place] FDA and has been authorized for detection and/or diagnosis of SARS-CoV-2 by FDA under an Emergency Use Authorization (EUA). This EUA will remain in effect (meaning this test can be used) for the duration of the COVID-19 declaration under Section 564(b)(1) of the Act, 21 U.S.C. section 360bbb-3(b)(1), unless the authorization is terminated or revoked.  Performed at [facility], [place] [address], [place],  [address]   MRSA PCR Screening     Status: None   Collection Time: 05/15/21 10:45 AM   Specimen: Nasal Mucosa; Nasopharyngeal  Result Value Ref Range Status   MRSA by PCR NEGATIVE NEGATIVE Final  Comment:        The GeneXpert MRSA Assay (FDA approved for NASAL specimens only), is one component of a comprehensive MRSA colonization surveillance program. It is not intended to diagnose MRSA infection nor to guide or monitor treatment for MRSA infections. Performed at [facility], [place] [address] [place], [place] [address]   Culture, Respiratory w Gram Stain     Status: None   Collection Time: 05/19/21  8:51 AM   Specimen: Tracheal Aspirate; Respiratory  Result Value Ref Range Status   Specimen Description TRACHEAL ASPIRATE  Final   Special Requests NONE  Final   Gram Stain   Final    ABUNDANT WBC PRESENT, PREDOMINANTLY PMN ABUNDANT GRAM POSITIVE COCCI IN CLUSTERS FEW GRAM NEGATIVE DIPLOCOCCI RARE GRAM NEGATIVE RODS Performed at [facility], [place] [address], [place], [place] [address]    Culture ABUNDANT STAPHYLOCOCCUS AUREUS  Final   Report Status 05/22/2021 FINAL  Final   Organism ID, Bacteria STAPHYLOCOCCUS AUREUS  Final      Susceptibility   Staphylococcus aureus - MIC*    CIPROFLOXACIN <=0.5 SENSITIVE Sensitive     ERYTHROMYCIN <=0.25 SENSITIVE Sensitive     GENTAMICIN <=0.5 SENSITIVE Sensitive     OXACILLIN <=0.25 SENSITIVE  Sensitive     TETRACYCLINE <=1 SENSITIVE Sensitive     VANCOMYCIN 1 SENSITIVE Sensitive     TRIMETH/SULFA <=10 SENSITIVE Sensitive     CLINDAMYCIN <=0.25 SENSITIVE Sensitive     RIFAMPIN <=0.5 SENSITIVE Sensitive     Inducible Clindamycin NEGATIVE Sensitive     * ABUNDANT STAPHYLOCOCCUS AUREUS  Culture, blood (Routine X 2) w Reflex to ID Panel     Status: Abnormal   Collection Time: 05/21/21 11:03 AM   Specimen: BLOOD  Result Value Ref Range Status   Specimen Description BLOOD BLOOD RIGHT HAND  Final   Special Requests   Final    BOTTLES DRAWN AEROBIC ONLY Blood Culture adequate volume   Culture  Setup Time   Final    GRAM POSITIVE COCCI IN CLUSTERS AEROBIC BOTTLE ONLY CRITICAL VALUE NOTED.  VALUE IS CONSISTENT WITH PREVIOUSLY REPORTED AND CALLED VALUE.    Culture (A)  Final    STAPHYLOCOCCUS EPIDERMIDIS SUSCEPTIBILITIES PERFORMED ON PREVIOUS CULTURE WITHIN THE LAST 5 DAYS. Performed at [facility], [place] [address] [place], [place] [address]    Report Status 05/25/2021 FINAL  Final  Culture, blood (Routine X 2) w Reflex to ID Panel     Status: Abnormal   Collection Time: 05/21/21 11:09 AM   Specimen: BLOOD  Result Value Ref Range Status   Specimen Description BLOOD BLOOD RIGHT HAND  Final   Special Requests   Final    BOTTLES DRAWN AEROBIC ONLY Blood Culture results may not be optimal due to an inadequate volume of blood received in culture bottles   Culture  Setup Time   Final    GRAM POSITIVE COCCI IN CLUSTERS AEROBIC BOTTLE ONLY CRITICAL RESULT CALLED TO, READ BACK BY AND VERIFIED WITH: PHARM D M.Berline Lopes ON [id number] AT 1050 BY E.PARRISH Performed at [facility], [place] [address], [place], [place] [address]    Culture (A)  Final    STAPHYLOCOCCUS EPIDERMIDIS STAPHYLOCOCCUS HOMINIS    Report Status 05/25/2021 FINAL  Final   Organism ID, Bacteria STAPHYLOCOCCUS EPIDERMIDIS  Final   Organism ID, Bacteria STAPHYLOCOCCUS HOMINIS  Final      Susceptibility    Staphylococcus epidermidis - MIC*    CIPROFLOXACIN <=0.5  SENSITIVE Sensitive     ERYTHROMYCIN <=0.25 SENSITIVE Sensitive     GENTAMICIN <=0.5 SENSITIVE Sensitive     OXACILLIN <=0.25 SENSITIVE Sensitive     TETRACYCLINE <=1 SENSITIVE Sensitive     VANCOMYCIN 1 SENSITIVE Sensitive     TRIMETH/SULFA <=10 SENSITIVE Sensitive     CLINDAMYCIN <=0.25 SENSITIVE Sensitive     RIFAMPIN <=0.5 SENSITIVE Sensitive     Inducible Clindamycin NEGATIVE Sensitive     * STAPHYLOCOCCUS EPIDERMIDIS   Staphylococcus hominis - MIC*    CIPROFLOXACIN <=0.5 SENSITIVE Sensitive     ERYTHROMYCIN <=0.25 SENSITIVE Sensitive     GENTAMICIN <=0.5 SENSITIVE Sensitive     OXACILLIN RESISTANT Resistant     TETRACYCLINE <=1 SENSITIVE Sensitive     VANCOMYCIN <=0.5 SENSITIVE Sensitive     TRIMETH/SULFA <=10 SENSITIVE Sensitive     CLINDAMYCIN <=0.25 SENSITIVE Sensitive     RIFAMPIN <=0.5 SENSITIVE Sensitive     Inducible Clindamycin NEGATIVE Sensitive     * STAPHYLOCOCCUS HOMINIS  Blood Culture ID Panel (Reflexed)     Status: Abnormal   Collection Time: 05/21/21 11:09 AM  Result Value Ref Range Status   Enterococcus faecalis NOT DETECTED NOT DETECTED Final   Enterococcus Faecium NOT DETECTED NOT DETECTED Final   Listeria monocytogenes NOT DETECTED NOT DETECTED Final   Staphylococcus species DETECTED (A) NOT DETECTED Final    Comment: CRITICAL RESULT CALLED TO, READ BACK BY AND VERIFIED WITH: PHARM D M.TUCKER ON [id number] AT 1050 BY E.PARRISH    Staphylococcus aureus (BCID) NOT DETECTED NOT DETECTED Final   Staphylococcus epidermidis DETECTED (A) NOT DETECTED Final    Comment: Methicillin (oxacillin) resistant coagulase negative staphylococcus. Possible blood culture contaminant (unless isolated from more than one blood culture draw or clinical case suggests pathogenicity). No antibiotic treatment is indicated for blood  culture contaminants. CRITICAL RESULT CALLED TO, READ BACK BY AND VERIFIED WITH: PHARM D  M.TUCKER ON [id number] AT 1050 BY E.PARRISH    Staphylococcus lugdunensis NOT DETECTED NOT DETECTED Final   Streptococcus species NOT DETECTED NOT DETECTED Final   Streptococcus agalactiae NOT DETECTED NOT DETECTED Final   Streptococcus pneumoniae NOT DETECTED NOT DETECTED Final   Streptococcus pyogenes NOT DETECTED NOT DETECTED Final   A.calcoaceticus-baumannii NOT DETECTED NOT DETECTED Final   Bacteroides fragilis NOT DETECTED NOT DETECTED Final   Enterobacterales NOT DETECTED NOT DETECTED Final   Enterobacter cloacae complex NOT DETECTED NOT DETECTED Final   Escherichia coli NOT DETECTED NOT DETECTED Final   Klebsiella aerogenes NOT DETECTED NOT DETECTED Final   Klebsiella oxytoca NOT DETECTED NOT DETECTED Final   Klebsiella pneumoniae NOT DETECTED NOT DETECTED Final   Proteus species NOT DETECTED NOT DETECTED Final   Salmonella species NOT DETECTED NOT DETECTED Final   Serratia marcescens NOT DETECTED NOT DETECTED Final   Haemophilus influenzae NOT DETECTED NOT DETECTED Final   Neisseria meningitidis NOT DETECTED NOT DETECTED Final   Pseudomonas aeruginosa NOT DETECTED NOT DETECTED Final   Stenotrophomonas maltophilia NOT DETECTED NOT DETECTED Final   Candida albicans NOT DETECTED NOT DETECTED Final   Candida auris NOT DETECTED NOT DETECTED Final   Candida glabrata NOT DETECTED NOT DETECTED Final   Candida krusei NOT DETECTED NOT DETECTED Final   Candida parapsilosis NOT DETECTED NOT DETECTED Final   Candida tropicalis NOT DETECTED NOT DETECTED Final   Cryptococcus neoformans/gattii NOT DETECTED NOT DETECTED Final   Methicillin resistance mecA/C DETECTED (A) NOT DETECTED Final    Comment: CRITICAL RESULT CALLED TO, READ BACK BY AND VERIFIED  WITH: PHARM D M.Berline Lopes ON [id number] AT 1050 BY E.PARRISH Performed at [facility], [place] [address] [place], [place] [address]   Culture, Respiratory w Gram Stain     Status: None   Collection Time: 05/28/21  7:43 AM   Specimen: Tracheal  Aspirate; Respiratory  Result Value Ref Range Status   Specimen Description TRACHEAL ASPIRATE  Final   Special Requests NONE  Final   Gram Stain   Final    FEW WBC PRESENT,BOTH PMN AND MONONUCLEAR RARE GRAM POSITIVE COCCI IN PAIRS    Culture   Final    Normal respiratory flora-no Staph aureus or Pseudomonas seen Performed at [facility], [address], [place], [place] [address]    Report Status 05/30/2021 FINAL  Final  Culture, blood (routine x 2)     Status: Abnormal   Collection Time: 05/28/21  8:27 AM   Specimen: BLOOD  Result Value Ref Range Status   Specimen Description BLOOD RIGHT ANTECUBITAL  Final   Special Requests   Final    BOTTLES DRAWN AEROBIC AND ANAEROBIC Blood Culture adequate volume   Culture  Setup Time   Final    GRAM POSITIVE COCCI IN CLUSTERS AEROBIC BOTTLE ONLY CRITICAL VALUE NOTED.  VALUE IS CONSISTENT WITH PREVIOUSLY REPORTED AND CALLED VALUE. Performed at [facility], [place] [address], [place], [place] [address]    Culture STAPHYLOCOCCUS EPIDERMIDIS (A)  Final   Report Status 05/31/2021 FINAL  Final   Organism ID, Bacteria STAPHYLOCOCCUS EPIDERMIDIS  Final      Susceptibility   Staphylococcus epidermidis - MIC*    CIPROFLOXACIN >=8 RESISTANT Resistant     ERYTHROMYCIN >=8 RESISTANT Resistant     GENTAMICIN <=0.5 SENSITIVE Sensitive     OXACILLIN >=4 RESISTANT Resistant     TETRACYCLINE 2 SENSITIVE Sensitive     VANCOMYCIN <=0.5 SENSITIVE Sensitive     TRIMETH/SULFA 80 RESISTANT Resistant     CLINDAMYCIN >=8 RESISTANT Resistant     RIFAMPIN <=0.5 SENSITIVE Sensitive     Inducible Clindamycin NEGATIVE Sensitive     * STAPHYLOCOCCUS EPIDERMIDIS  Culture, blood (routine x 2)     Status: Abnormal   Collection Time: 05/28/21  8:28 AM   Specimen: BLOOD  Result Value Ref Range Status   Specimen Description BLOOD RIGHT ANTECUBITAL  Final   Special Requests   Final    BOTTLES DRAWN AEROBIC AND ANAEROBIC Blood Culture adequate volume    Culture  Setup Time   Final    GRAM POSITIVE COCCI IN CLUSTERS AEROBIC BOTTLE ONLY CRITICAL VALUE NOTED.  VALUE IS CONSISTENT WITH PREVIOUSLY REPORTED AND CALLED VALUE. Performed at [facility], [place] [address], [place], [place] [address]    Culture STAPHYLOCOCCUS EPIDERMIDIS (A)  Final   Report Status 06/02/2021 FINAL  Final   Organism ID, Bacteria STAPHYLOCOCCUS EPIDERMIDIS  Final      Susceptibility   Staphylococcus epidermidis - MIC*    CIPROFLOXACIN >=8 RESISTANT Resistant     ERYTHROMYCIN <=0.25 SENSITIVE Sensitive     GENTAMICIN <=0.5 SENSITIVE Sensitive     OXACILLIN >=4 RESISTANT Resistant     TETRACYCLINE 2 SENSITIVE Sensitive     VANCOMYCIN <=0.5 SENSITIVE Sensitive     TRIMETH/SULFA 80 RESISTANT Resistant     CLINDAMYCIN <=0.25 SENSITIVE Sensitive     RIFAMPIN <=0.5 SENSITIVE Sensitive     Inducible Clindamycin NEGATIVE Sensitive     * STAPHYLOCOCCUS EPIDERMIDIS  Culture, blood (Routine X 2) w Reflex to  ID Panel     Status: None (Preliminary result)   Collection Time: 06/02/21  2:17 PM   Specimen: BLOOD LEFT HAND  Result Value Ref Range Status   Specimen Description BLOOD LEFT HAND  Final   Special Requests   Final    BOTTLES DRAWN AEROBIC AND ANAEROBIC Blood Culture adequate volume   Culture   Final    NO GROWTH < 24 HOURS Performed at [facility], [place] [address], [place], [place] [address]    Report Status PENDING  Incomplete  Culture, blood (Routine X 2) w Reflex to ID Panel     Status: None (Preliminary result)   Collection Time: 06/02/21  2:22 PM   Specimen: BLOOD  Result Value Ref Range Status   Specimen Description BLOOD LEFT ANTECUBITAL  Final   Special Requests   Final    BOTTLES DRAWN AEROBIC AND ANAEROBIC Blood Culture adequate volume   Culture   Final    NO GROWTH < 24 HOURS Performed at [facility], [place] [address] [place], [place] [address]    Report Status PENDING  Incomplete    Studies/Results: DG CHEST PORT 1  VIEW  Result Date: 06/02/2021 CLINICAL DATA:  Fever. EXAM: PORTABLE CHEST 1 VIEW COMPARISON:  05/30/2021 FINDINGS: Stable enlarged cardiac silhouette. Tracheostomy tube in satisfactory position. Feeding tube extending into the stomach. Dense left lower lobe airspace consolidation is again demonstrated. Mild patchy density at the medial right lung base. Unremarkable bones. IMPRESSION: 1. Stable dense left lower lobe atelectasis or pneumonia. 2. Interval mild patchy atelectasis or pneumonia at the medial right lung base. 3. Stable cardiomegaly. Electronically Signed   By: Claudie Revering M.D.   On: 06/02/2021 15:55      Assessment/Plan:  INTERVAL HISTORY:   Repeat chest x-ray not showed area of atelectasis versus infection long with prior consolidation that is stable  Repeat blood cultures so far no growth to date  Principal Problem:   Cerebrovascular accident (CVA) ([place]) Active Problems:   Acute respiratory failure with hypoxia (H[facility])   Fibroids   Iron deficiency anemia due to chronic blood loss   Cerebral edema (HCC)   Chronic HFrEF (heart failure with reduced ejection fraction) ([place])   Pneumonia due to methicillin susceptible Staphylococcus aureus ([place])   Abdominal distention   Encounter for feeding tube placement   Encounter for intubation   Ileus ([place] [place])   Staphylococcus epidermidis bacteremia    Lexani Corona is a 45 y.o. female with history of diabetes mellitus uterine fibroids paroxysmal supra ventricular tachycardia heart failure, paranoid schizophrenia  admitted with MCA stroke status post embolization complicated by contrast extravasation, with development of a subarachnoid hemorrhage.  He then was found to have a right basal ganglia and infarct with 3 mm leftward shift.  She had respiratory failure on the 26 and was intubated.  Placement of tracheostomy tube and now has feeding tube in place.  She was treated for 7 days with cefazolin for MSSA pneumonia.  Terri Piedra and Dr.  Linus Salmons had seen the patient yesterday and recommended observing her off antibiotics.  She had grown staph epidermidis and staph hominis from blood cultures taken from the right hand on 30 May and staph epidermidis grown from a blood culture taken from the right hand on 30 May as well.  These were felt to represent contaminants though they were reported as being drawn from 2 different sites that are both labeled as the right hand.  Staph epidermidis was methicillin sensitive  where is the Staph hominis was methicillin-resistant.  If 1 of these 2 organisms was actually a pathogen it would have to be the Staph epidermidis since it did grow from 2 sites.  I would have thought that would have been properly treated by her cefazolin.  Since then she has had repeat blood cultures done on 6 June that is again grown staph epidermidis from 2 different sites though the sensitivities are slightly different between the 2 organisms with 1 being clindamycin and erythro sensitive and the other 1 clindamycin 3 mycin resistant. I was called back to see the patient because of her which are of 101 degrees yesterday and her worsening thrombocytosis.  From looking through the notes and talking to the nurse at the bedside the patient seems clinically overall to have improved.  I did order a chest x-ray to assess her lungs again though she seems to be doing better. This showed area of prior consolidation along with atelectatic change versus possible infiltrate  Repeat blood cultures I ordered yesterday have been no growth to date so far  Thrombocytosis is slightly worse again.  D-dimer was positive and CT angiogram is pending  I spent more than 35 minutes with the patient including greater than 50% of time in face to face counseling of the patient personally reviewing radiographs, along with pertinent laboratory microbiological data review of medical records and in coordination of her care.  Dr. Linus Salmons is back  tomorrow.     LOS: 19 days   Alcide Evener 06/03/2021, 2:43 PM

## 2021-06-03 NOTE — Progress Notes (Signed)
STROKE TEAM PROGRESS NOTE   INTERVAL HISTORY No family at the bedside. Pt  seems to be breathing well ontrach collar.  But has increased oral secretions..  Neuro stable, she is alert and following commands on the right.   .  Vital signs are stable.  Continues to have low-grade fever.  White count is down to 13.1 today.  She has finished course of antibiotics for MSSA pneumonia.  Trauma team has evaluated her for PEG and will schedule elective open surgical procedure of PEG placement given her large intra-abdominal fibroid    Vitals:   06/03/21 1639 06/03/21 1804 06/03/21 1844 06/03/21 2013  BP:  (!) 146/76 134/76 126/72  Pulse: 93 (!) 105 87 84  Resp: 17 20 16 17   Temp:  (!) 100.7 F (38.2 C) 99.2 F (37.3 C)   TempSrc:  Oral Oral   SpO2: 100% 99% 100% 100%  Weight:      Height:       CBC:  Recent Labs  Lab 05/31/21 0314 06/01/21 0255 06/02/21 0306 06/03/21 0903  WBC 13.1*   < > 11.5* 11.5*  NEUTROABS 11.1*  --   --   --   HGB 8.0*   < > 7.9* 8.6*  HCT 27.2*   < > 27.0* 29.0*  MCV 76.6*   < > 78.3* 79.2*  PLT 533*   < > 638* 693*   < > = values in this interval not displayed.   Basic Metabolic Panel:  Recent Labs  Lab 06/02/21 0306 06/03/21 1613  NA 137 136  K 4.6 5.2*  CL 108 108  CO2 19* 19*  GLUCOSE 125* 81  BUN 20 20  CREATININE 1.14* 1.09*  CALCIUM 9.3 9.6   Lipid Panel:  No results for input(s): CHOL, TRIG, HDL, CHOLHDL, VLDL, LDLCALC in the last 168 hours.  HgbA1c:  No results for input(s): HGBA1C in the last 168 hours. Urine Drug Screen:  No results for input(s): LABOPIA, COCAINSCRNUR, LABBENZ, AMPHETMU, THCU, LABBARB in the last 168 hours.  Alcohol Level  No results for input(s): ETH in the last 168 hours.  IMAGING  CT HEAD WO CONTRAST Result Date: 05/16/2021  IMPRESSION:  1. Continued interval evolution of moderately large right MCA distribution infarct. Associated regional mass effect has mildly increased, with new 2 mm of right-to-left midline  shift. No hydrocephalus or trapping.  2. Decreased prominence of subarachnoid hemorrhage at the posterior right Sylvian fissure and overlying right frontoparietal region. Trace intraventricular hemorrhage related to redistribution.  3. Question new area of parenchymal hypodensity involving the lateral left cerebellum. While this finding is favored to be artifactual, a possible new acute ischemic infarct is difficult to exclude, and could be considered in the correct clinical setting. Attention at follow-up recommended.  4. No other new acute intracranial abnormality.   CT HEAD WO CONTRAST Result Date: 05/15/2021  IMPRESSION: Small-to-moderate volume hyperdensity within the right sylvian fissure and along portions of the right frontoparietal and temporal lobes compatible with extravasated contrast/subarachnoid hemorrhage. There is hyperdensity within the right basal ganglia, right insula, lateral right frontal lobe/frontal operculum and within portions of the right parietal lobe likely reflecting contrast staining at sites of acute infarction. Mild associated mass effect with subtle partial effacement of the right lateral ventricle. No midline shift or hydrocephalus. Embolization material now overlies the lateral right parietal lobe. Stable background parenchymal atrophy and chronic small vessel ischemic disease. Redemonstrated chronic infarct within the superior left cerebellum.   MR BRAIN WO CONTRAST Result  Date: 05/15/2021 IMPRESSION:  1. Moderately large acute right MCA infarct. Associated petechial hemorrhage, subarachnoid hemorrhage, and minimal intraventricular hemorrhage.  2. Severe chronic small vessel ischemic disease with multiple chronic infarcts.   MR CERVICAL SPINE WO CONTRAST Result Date: 05/15/2021  IMPRESSION: The cord itself appears normal. No disc disease. No spinal stenosis. 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 No significant encroachment upon the neural spaces.  Some prevertebral soft tissue edema. In this clinical scenario, this may be subsequent to previous endotracheal intubation. One could not rule out pharyngitis or cellulitis but that seems less likely. I do not see any traumatic injury to the cervical spine itself. Electronically Signed   By: Nelson Chimes M.D.   On: 05/15/2021 16:05   IR CT Head Ltd Result Date: 05/15/2021 CT of the head was obtained and post processed in a separate workstation with concurrent attending physician supervision. Selected images were sent to PACS. Small right sylvian contrast extravasation confirmed. Additionally, hyperdensity of the right basal ganglia and lateral right frontal cortex are noted, related to ongoing ischemia. Right internal carotid artery angiograms with frontal and lateral views of the head showed occlusion of the embolized branch with no evidence of contrast extravasation. Patency of the remaining MCA branches noted.  IR PERCUTANEOUS ART THROMBECTOMY/INFUSION INTRACRANIAL INC DIAG ANGIO Result Date: 05/15/2021 IMPRESSION: Mechanical thrombectomy performed for treatment of right M1/MCA occlusion with clot fragmentation and embolization to same distal territory requiring multiple additional passes. Procedure complicated by contrast extravasation at a distal right M3/MCA branch requiring vessel embolization. Near complete recanalization of the right MCA vascular tree was achieved (TICI 2b). PLAN: - Bed rest post femoral access x6h - Follow-up CT in 4 hours to evaluate for SAH/contrast extravasation stability - Transfer to ICU- SBP: 120-140 mmHg   Echo Result read: 05/16/2021 1. Left ventricular ejection fraction, by estimation, is 55 to 60%. The  left ventricle has normal function. The left ventricle has no regional  wall motion abnormalities. There is moderate concentric left ventricular  hypertrophy. Left ventricular  diastolic parameters are consistent with Grade II diastolic dysfunction   (pseudonormalization). Elevated left atrial pressure.   2. Right ventricular systolic function is normal. The right ventricular  size is normal. Mildly increased right ventricular wall thickness. There  is moderately elevated pulmonary artery systolic pressure. The estimated  right ventricular systolic pressure   is 49.4 mmHg.   3. Left atrial size was severely dilated.   4. Right atrial size was severely dilated.   5. There is no evidence of cardiac tamponade.   6. The mitral valve is normal in structure. Trivial mitral valve  regurgitation. No evidence of mitral stenosis.   7. The aortic valve is normal in structure. There is mild calcification  of the aortic valve. There is mild thickening of the aortic valve. Aortic  valve regurgitation is not visualized. Mild aortic valve sclerosis is  present, with no evidence of aortic  valve stenosis.   8. The inferior vena cava is dilated in size with <50% respiratory  variability, suggesting right atrial pressure of 15 mmHg.   PHYSICAL EXAM  Temp:  [98.4 F (36.9 C)-100.7 F (38.2 C)] 99.2 F (37.3 C) (06/12 1844) Pulse Rate:  [84-105] 84 (06/12 2013) Resp:  [15-21] 17 (06/12 2013) BP: (126-152)/(67-78) 126/72 (06/12 2013) SpO2:  [98 %-100 %] 100 % (06/12 2013) FiO2 (%):  [21 %] 21 % (06/12 2013)  General - Well nourished, well developed middle-aged African-American lady, s/p trach and  PEG.   Ophthalmologic - fundi not visualized due to noncooperation.  Cardiovascular - Regular rhythm and rate  Neuro -   s/p trach on trach collar., eyes open able to follow commands on the right hand and foot consistently and able to gesture with her hand in response to questions.  Forced eye opening, eyes in right forced gaze preference, not blinking to visual threat on the left, not tracking, PERRL.  Mild left lower facial asymmetry.   Left UE/LE are flaccid. Right UE 3/5 proximal >distal RLE moves consistently in bed.  Babinski (+) L.   Sensation intact on right  Hypercoagulation panel.  Ref. Range 05/26/2021 11:04  PTT Lupus Anticoagulant Latest Ref Range: 0.0 - 51.9 sec 46.4  DRVVT Latest Ref Range: 0.0 - 47.0 sec 80.2 (H)  dRVVT Mix Latest Ref Range: 0.0 - 40.4 sec 57.3 (H)  dRVVT Confirm Latest Ref Range: 0.8 - 1.2 ratio 1.6 (H)    Ref. Range 06/03/2021 10:08  D-Dimer, Quant Latest Ref Range: 0.00 - 0.50 ug/mL-FEU 8.53 (H)    Ref. Range 05/26/2021 11:04  Anticardiolipin Ab,IgM,Qn Latest Ref Range: 0 - 12 MPL U/mL 17 (H)  PTT Lupus Anticoagulant Latest Ref Range: 0.0 - 51.9 sec 46.4  DRVVT Latest Ref Range: 0.0 - 47.0 sec 80.2 (H)  Phosphatydalserine, IgG Latest Ref Range: 0 - 30 Units 9  Phosphatydalserine, IgM Latest Ref Range: 0 - 30 Units 102 (H)    ASSESSMENT/PLAN [name] is a 45 y.o. female with history of uncontrolled HTN, tobacco use disorder, large uterine fibroids, heart failure, paroxysmal SVT, paranoid psychosis who presents with altered mental status after being found down.  CT head initially read as small acute appearing perforator infarct in the R BG and notable for severe small vessel ischemia. However concerning for potential R MCA stroke and thus CTA head and neck and CTP was obtained with a R MCA M1 occlusion and a core of 50cc and 57ml of penumbra.   Stroke:  right MCA stroke due to right M1 occlusion s/p IR with TICI 2b, complicated by rupture of right M3 requiring embolization, embolic pattern, source unclear CT head: Acute appearing perforator infarct at the right basal ganglia.  CTA neck: LVO right M1 origin. Left vertebral occlusion at its origin. High-grade narrowings of the right cavernous ICA. CT perfusion suggestive 50 cc core infarct and 50 cc of adjacent penumbra. MRI: large acute right MCA infarct. Associated petechial hemorrhage, subarachnoid hemorrhage, and minimal intraventricular hemorrhage. CT 5/28 stable right MCA infarct with MLS CT 5/30 stable right MCA infarct and  MLS 108mm 2D Echo 55 to 60% Hypercoagulable panel showed:  Anticardiolipin Ab,IgM - Indeterminate DRVVT >80 (low med to high) dRVVT Mix ratio >10 dRVVT Confirm ratio >1.2 Results suggest the presence of lupus anticoagulant however testing should be repeated after at least 12 weeks to confirm. Consider hematology consult as some of the levels were boarderline high.   Homocystine is normal.   UDS: negative LDL 76 HgbA1c 5.4 UDS neg Sickle cell screen - neg, ANA neg VTE prophylaxis - SCDs No antithrombotic prior to admission, now on ASA 81. Therapy recommendations:  CIR Disposition:  Pending  Cytotoxic cerebral edema On 3% saline @ 30 -> FW 100 Q4. Stopped. Now on free water through [place] Na 157->156->157->155->157->153->150->150 >147 CT head 5/30 stable right MCA infarcts and MLS 14mm OK to gradually normalize Na level  Respiratory failure Intubated 5/26 for increased work of breathing with altered mental status  Management per CCM,  appreciated Switched to precedex -> now off  Not candidate for extubation S/p trach 6/3 with CCM, Trach collar today  Acute blood loss anemia Large uterine fibroids hgb 7.8->5.9->PRBC->7.6->6.5->PRBC->7.2->6.7->PRBC->8.5->7.7->7.3->7.6 >7.5>9/1 PRBC tranfusion x 3 extensive virginal bleeding from extremely large abdominopelvic mass measuring up to 22.9 cm noted on CT 5/26 Has been followed with Dr. Jonna Clark - poor surgical candidate due to uncontrolled HTN and SVT Abdominal distention on exam Increase Megace to megace 80mg  tid OBGYN Dr. Rosana Hoes consulted, appreciate help - not surgical candidate  Dysphagia Likely due to stroke SLP following cortrak placement On TF @ 50 and FW  Paroxysmal SVT history Hx of CHF Followed with cardiology  EF 40-45% in 04/2020 and 20-25% in 12/2020 This admission EF 55-60% on amiodarone 200mg  daily and isosorbide 20 tid Currently heart rate controlled Consider TEE/Loop recorder prior to discahrge if able to  tolerate  Hypertension Home meds:  Hydralazine 100mg  TID, Imdur 60mg  daily, Toprol XL 200mg  daily Off Cleviprex now Amlodipine 10mg  daily -> 5mg  bid clonidine 0.2 tid -> 0.1 Q8h Hydralazine 100mg  tid   imdur 20mg  tid -> Q8h Metoprolol 100mg  bid SBP goal to <160 mmHg Labetalol prn Long-term BP goal normotensive  Hyperlipidemia Home meds:  none LDL 76, goal < 70 Atorvastatin 40mg  daily Continue statin at discharge  Tobacco abuse Current smoker Smoking cessation counseling will be provided  Fever and Leukocytosis WBC 12.7->14.0->23.1->20.9->20.6->19.1->16.8->15.4->18.1 >15.5>17.7  Afebrile for the past 24 h Sputum culture - staph Aures  Blood culture G+ cocci in clusters CXR LLL atx vs. pneumonia CCM on board On ancef   Other Stroke Risk Factors Coronary artery disease  Other Active Problems AKI: creatinine 1.27->1.48->1.54->1.51->1.52->1.75->1.39->1.43->1.45 >1.5>1.43 Lethargic - added provigil by CCM-DC on 05/30/2021 as patient is awake and blood pressure was elevated   hospital day #19  Patient seems to be breathing well on the trach collar. She is following commands consistently and able nod with her head and to gesture with her right hand.   Holly Sandhoff, MD Stroke Neurology Page: [phone] 06/03/2021 8:20 PM   To contact Stroke Continuity provider, please refer to [email or web address] After hours, contact General Neurology

## 2021-06-04 DIAGNOSIS — R509 Fever, unspecified: Secondary | ICD-10-CM

## 2021-06-04 DIAGNOSIS — D75839 Thrombocytosis, unspecified: Secondary | ICD-10-CM

## 2021-06-04 LAB — GLUCOSE, CAPILLARY
Glucose-Capillary: 106 mg/dL — ABNORMAL HIGH (ref 70–99)
Glucose-Capillary: 111 mg/dL — ABNORMAL HIGH (ref 70–99)
Glucose-Capillary: 112 mg/dL — ABNORMAL HIGH (ref 70–99)
Glucose-Capillary: 92 mg/dL (ref 70–99)
Glucose-Capillary: 94 mg/dL (ref 70–99)
Glucose-Capillary: 96 mg/dL (ref 70–99)

## 2021-06-04 LAB — COMPREHENSIVE METABOLIC PANEL
ALT: 13 U/L (ref 0–44)
AST: 26 U/L (ref 15–41)
Albumin: 2 g/dL — ABNORMAL LOW (ref 3.5–5.0)
Alkaline Phosphatase: 144 U/L — ABNORMAL HIGH (ref 38–126)
Anion gap: 9 (ref 5–15)
BUN: 21 mg/dL — ABNORMAL HIGH (ref 6–20)
CO2: 22 mmol/L (ref 22–32)
Calcium: 9.3 mg/dL (ref 8.9–10.3)
Chloride: 105 mmol/L (ref 98–111)
Creatinine, Ser: 1.1 mg/dL — ABNORMAL HIGH (ref 0.44–1.00)
GFR, Estimated: >60 mL/min (ref >60)
Glucose, Bld: 127 mg/dL — ABNORMAL HIGH (ref 70–99)
Potassium: 4.5 mmol/L (ref 3.5–5.1)
Sodium: 136 mmol/L (ref 135–145)
Total Bilirubin: 0.4 mg/dL (ref 0.3–1.2)
Total Protein: 6.1 g/dL — ABNORMAL LOW (ref 6.5–8.1)

## 2021-06-04 LAB — CBC
HCT: 26.2 % — ABNORMAL LOW (ref 36.0–46.0)
Hemoglobin: 7.6 g/dL — ABNORMAL LOW (ref 12.0–15.0)
MCH: 23.1 pg — ABNORMAL LOW (ref 26.0–34.0)
MCHC: 29 g/dL — ABNORMAL LOW (ref 30.0–36.0)
MCV: 79.6 fL — ABNORMAL LOW (ref 80.0–100.0)
Platelets: 633 10*3/uL — ABNORMAL HIGH (ref 150–400)
RBC: 3.29 MIL/uL — ABNORMAL LOW (ref 3.87–5.11)
RDW: 31.8 % — ABNORMAL HIGH (ref 11.5–15.5)
WBC: 8.9 10*3/uL (ref 4.0–10.5)
nRBC: 0 % (ref 0.0–0.2)

## 2021-06-04 NOTE — Plan of Care (Signed)
  Problem: Education: Goal: Knowledge of disease or condition will improve Outcome: Progressing Goal: Knowledge of secondary prevention will improve Outcome: Progressing   Problem: Education: Goal: Knowledge of General Education information will improve Description: Including pain rating scale, medication(s)/side effects and non-pharmacologic comfort measures Outcome: Progressing   Problem: Health Behavior/Discharge Planning: Goal: Ability to manage health-related needs will improve Outcome: Progressing   Problem: Clinical Measurements: Goal: Ability to maintain clinical measurements within normal limits will improve Outcome: Progressing Goal: Will remain free from infection Outcome: Progressing Goal: Diagnostic test results will improve Outcome: Progressing Goal: Respiratory complications will improve Outcome: Progressing Goal: Cardiovascular complication will be avoided Outcome: Progressing   Problem: Activity: Goal: Risk for activity intolerance will decrease Outcome: Progressing   Problem: Nutrition: Goal: Adequate nutrition will be maintained Outcome: Progressing   Problem: Coping: Goal: Level of anxiety will decrease Outcome: Progressing   Problem: Elimination: Goal: Will not experience complications related to bowel motility Outcome: Progressing Goal: Will not experience complications related to urinary retention Outcome: Progressing   Problem: Pain Managment: Goal: General experience of comfort will improve Outcome: Progressing   Problem: Safety: Goal: Ability to remain free from injury will improve Outcome: Progressing   Problem: Skin Integrity: Goal: Risk for impaired skin integrity will decrease Outcome: Progressing

## 2021-06-04 NOTE — Progress Notes (Signed)
PROGRESS NOTE    Holly Bradley   YTK:[id number]  DOB: August 09, 1976  DOA: 05/15/2021 PCP: Mitzi Hansen, MD   Brief Narrative:  Holly Bradley is a 45  Y/o with HTN, DM 2, large uterine fibroids, pSVT, HFpEF, paranoid psychosis who presented on 5/24 with confusion.  She was found to have a right MCA M1 occlusion and underwent a thrombectomy which was complicated by contrast extravasation and M3 coil embolization and revascularization. She developed a subarachnoid hemorrhage. Repeat imaging reveal a right basal ganglia infarct with a 3 mm right to left shift. She was started on hypertonic saline. On 5/26 she developed respiratory distress and was intubated. On 5/26 a head CT showed increased cytotoxic edema and a left sided shift with was 5 mm. She has not had improvement in mental status and now has a trach. She continues to require tube feeds. She has been treated for MSSA pneumonia with Ancef (course complted on 6/7)  Blood cultures on 5/30 noted to be positive for staph Epi. These were repeated and remain positive. Ancef has been resumed.    Subjective: She has not complaints.     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 complicated by ruptured right M3 causing which was successfully embolized Dysphagia - awake minimally communicative with trach and tube feeds - hypercoagulable w/u as follows: -Anticardiolipin IgM is 17, DRVVT 80.2 (range from moderate is 20-80)- have has stroke team to review findings - Family would like PEG tube- general surgery following with a plan for open G tube due to extensive fibroids and difficult anatomy - cont Lipitor and ASA 81 mg  Active Problems:   Acute respiratory failure with hypoxia  - continue Trach per PCCM- they plan to see Tues and Thurs for trach care  MSSA pneumonia - 1 wk of antibiotics completed on 6/6  Staph epidermidis bacteremia, fever, leucocytosis> sepsis - noted on  blood cultures from 5/30 and again on repeat culture from 6/6 -   Ancef resumed 6/8-6/9 -  based on sensitivities, culture are growing 2 different types of staph epi (one is a MRSE) and also staph hominis  - ID recommends holding off on Abx as cultures likely contaminated - last elevated tem temp 101 ton 6/10  - UA negative-  CTA chest negative for infection and PE  - no diarrhea, no rash, does not complain of pain when asked, no pressure ulcers per RN - tachycardia and tachypnea have resolved - lactic acid was normal - ID also following  Thrombocytosis - following- it is rising daily- likley as an acute phase reactant       Fibroids - extremely large fibroids palpable on exam - she had some vaginal bleeding & OB/GYN evaluated her on 3/31 and recommended Megace 80 mg TID to stop the bleeding (on a lower dose a outpt)- also stated that she is currently not a surgical candidate - I spoke with Dr Elly Modena and she recommended to continue Megace- A fibroidectomy can be revisited in the future     Iron deficiency anemia due to chronic blood loss - Hb ~ 7- 8 - resumed oral Iron - following Hb intermittently     Chronic systolic and diastolic CHF  Difficult to control HTN - ECHO on 5/25 reveals that EF has improved to normal and she has grade 2 d CHF - cont Entresto, Imdur, Hydralazine, Clonidine and Coreg  - Demadex on hold  - following fluid  status  Nutrition - cont the following per nutrition note>  osmolite 1.5 @ 50 cc /hr Prosource TF 45 ml BID 200 cc freee water Q 6 hrs for a total of 1512 cc/day  H/o PSVT - cont Amiodarone (home med)   Tobacco abuse - prior to admission   Time spent in minutes: 35 DVT prophylaxis: SCD's Start: 05/15/21 0753 Code Status: no CPR Family Communication:  Level of Care: Level of care: Med-Surg Disposition Plan:  Status is: Inpatient  Remains inpatient appropriate because:IV treatments appropriate due to intensity of illness or inability to  take PO   Dispo: The patient is from: Home              Anticipated d/c is to:  TBD              Patient currently is not medically stable to d/c.   Difficult to place patient Yes   Consultants:  Neurology PCCM Palliative care IR Procedures:  thrombectomy Antimicrobials:  Anti-infectives (From admission, onward)    Start     Dose/Rate Route Frequency Ordered Stop   05/30/21 1515  ceFAZolin (ANCEF) IVPB 2g/100 mL premix  Status:  Discontinued        2 g 200 mL/hr over 30 Minutes Intravenous Every 8 hours 05/30/21 1415 05/31/21 1404   05/21/21 1100  ceFAZolin (ANCEF) IVPB 2g/100 mL premix        2 g 200 mL/hr over 30 Minutes Intravenous Every 8 hours 05/21/21 1030 05/28/21 0537        Objective: Vitals:   06/04/21 0727 06/04/21 0806 06/04/21 1215 06/04/21 1244  BP: 136/71  128/65 (!) 141/75  Pulse: 94 97  98  Resp: 20 (!) 22  20  Temp: 99.7 F (37.6 C)   100 F (37.8 C)  TempSrc: Oral   Oral  SpO2: 100%   100%  Weight:      Height:        Intake/Output Summary (Last 24 hours) at 06/04/2021 1425 Last data filed at 06/04/2021 1247 Gross per 24 hour  Intake --  Output 2100 ml  Net -2100 ml    Filed Weights   05/30/21 0322 05/31/21 0355 05/31/21 1528  Weight: 74 kg 80.2 kg 76.6 kg    Examination: General exam: Appears comfortable  HEENT: PERRLA, oral mucosa moist, no sclera icterus or thrush- trach present, cor track present Respiratory system: Clear to auscultation. Respiratory effort normal. Cardiovascular system: S1 & S2 heard, regular rate and rhythm Gastrointestinal system: Abdomen soft, non-tender, nondistended. Normal bowel sounds   Central nervous system: Alert - responding to questions-- left side paralyzed neurological deficits. Extremities: No cyanosis, clubbing or edema Skin: No rashes or ulcers Psychiatry:  Mood & affect appropriate.      Data Reviewed: I have personally reviewed following labs and imaging studies  CBC: Recent Labs  Lab  05/31/21 0314 06/01/21 0255 06/01/21 0640 06/02/21 0306 06/03/21 0903 06/04/21 0457  WBC 13.1* 11.4*  --  11.5* 11.5* 8.9  NEUTROABS 11.1*  --   --   --   --   --   HGB 8.0* 7.8* 8.1* 7.9* 8.6* 7.6*  HCT 27.2* 26.9* 28.3* 27.0* 29.0* 26.2*  MCV 76.6* 77.5*  --  78.3* 79.2* 79.6*  PLT 533* 577*  --  638* 693* 633*    Basic Metabolic Panel: Recent Labs  Lab 05/31/21 0314 06/01/21 0255 06/02/21 0306 06/03/21 1613 06/04/21 0457  NA 137 135 137 136 136  K 4.6 4.6 4.6  5.2* 4.5  CL 107 107 108 108 105  CO2 20* 20* 19* 19* 22  GLUCOSE 113* 110* 125* 81 127*  BUN '19 18 20 20 ' 21*  CREATININE 1.12* 1.04* 1.14* 1.09* 1.10*  CALCIUM 9.0 9.1 9.3 9.6 9.3    GFR: Estimated Creatinine Clearance: 64.7 mL/min (A) (by C-G formula based on SCr of 1.1 mg/dL (H)). Liver Function Tests: Recent Labs  Lab 06/03/21 1613 06/04/21 0457  AST 38 26  ALT 14 13  ALKPHOS 163* 144*  BILITOT 0.2* 0.4  PROT 6.4* 6.1*  ALBUMIN 2.2* 2.0*   No results for input(s): LIPASE, AMYLASE in the last 168 hours. No results for input(s): AMMONIA in the last 168 hours. Coagulation Profile: No results for input(s): INR, PROTIME in the last 168 hours. Cardiac Enzymes: No results for input(s): CKTOTAL, CKMB, CKMBINDEX, TROPONINI in the last 168 hours. BNP (last 3 results) No results for input(s): PROBNP in the last 8760 hours. HbA1C: No results for input(s): HGBA1C in the last 72 hours. CBG: Recent Labs  Lab 06/03/21 2006 06/04/21 0012 06/04/21 0448 06/04/21 0804 06/04/21 1102  GLUCAP 121* 111* 96 106* 112*    Lipid Profile: No results for input(s): CHOL, HDL, LDLCALC, TRIG, CHOLHDL, LDLDIRECT in the last 72 hours. Thyroid Function Tests: No results for input(s): TSH, T4TOTAL, FREET4, T3FREE, THYROIDAB in the last 72 hours. Anemia Panel: No results for input(s): VITAMINB12, FOLATE, FERRITIN, TIBC, IRON, RETICCTPCT in the last 72 hours. Urine analysis:    Component Value Date/Time   COLORURINE  YELLOW 06/01/2021 2051   APPEARANCEUR HAZY (A) 06/01/2021 2051   LABSPEC 1.012 06/01/2021 2051   PHURINE 7.0 06/01/2021 2051   GLUCOSEU NEGATIVE 06/01/2021 2051   HGBUR MODERATE (A) 06/01/2021 2051   BILIRUBINUR NEGATIVE 06/01/2021 2051   [place] 06/01/2021 2051   PROTEINUR 30 (A) 06/01/2021 2051   NITRITE NEGATIVE 06/01/2021 2051   LEUKOCYTESUR SMALL (A) 06/01/2021 2051   Sepsis Labs: '@LABRCNTIP' (procalcitonin:4,lacticidven:4) ) Recent Results (from the past 240 hour(s))  Culture, Respiratory w Gram Stain     Status: None   Collection Time: 05/28/21  7:43 AM   Specimen: Tracheal Aspirate; Respiratory  Result Value Ref Range Status   Specimen Description TRACHEAL ASPIRATE  Final   Special Requests NONE  Final   Gram Stain   Final    FEW WBC PRESENT,BOTH PMN AND MONONUCLEAR RARE GRAM POSITIVE COCCI IN PAIRS    Culture   Final    Normal respiratory flora-no Staph aureus or Pseudomonas seen Performed at [facility], [address] [place], [place] [address]    Report Status 05/30/2021 FINAL  Final  Culture, blood (routine x 2)     Status: Abnormal   Collection Time: 05/28/21  8:27 AM   Specimen: BLOOD  Result Value Ref Range Status   Specimen Description BLOOD RIGHT ANTECUBITAL  Final   Special Requests   Final    BOTTLES DRAWN AEROBIC AND ANAEROBIC Blood Culture adequate volume   Culture  Setup Time   Final    GRAM POSITIVE COCCI IN CLUSTERS AEROBIC BOTTLE ONLY CRITICAL VALUE NOTED.  VALUE IS CONSISTENT WITH PREVIOUSLY REPORTED AND CALLED VALUE. Performed at [facility], [place] [address], [place], [place] [address]    Culture STAPHYLOCOCCUS EPIDERMIDIS (A)  Final   Report Status 05/31/2021 FINAL  Final   Organism ID, Bacteria STAPHYLOCOCCUS EPIDERMIDIS  Final      Susceptibility   Staphylococcus epidermidis - MIC*    CIPROFLOXACIN >=8 RESISTANT  Resistant     ERYTHROMYCIN >=8 RESISTANT Resistant     GENTAMICIN <=0.5 SENSITIVE Sensitive      OXACILLIN >=4 RESISTANT Resistant     TETRACYCLINE 2 SENSITIVE Sensitive     VANCOMYCIN <=0.5 SENSITIVE Sensitive     TRIMETH/SULFA 80 RESISTANT Resistant     CLINDAMYCIN >=8 RESISTANT Resistant     RIFAMPIN <=0.5 SENSITIVE Sensitive     Inducible Clindamycin NEGATIVE Sensitive     * STAPHYLOCOCCUS EPIDERMIDIS  Culture, blood (routine x 2)     Status: Abnormal   Collection Time: 05/28/21  8:28 AM   Specimen: BLOOD  Result Value Ref Range Status   Specimen Description BLOOD RIGHT ANTECUBITAL  Final   Special Requests   Final    BOTTLES DRAWN AEROBIC AND ANAEROBIC Blood Culture adequate volume   Culture  Setup Time   Final    GRAM POSITIVE COCCI IN CLUSTERS AEROBIC BOTTLE ONLY CRITICAL VALUE NOTED.  VALUE IS CONSISTENT WITH PREVIOUSLY REPORTED AND CALLED VALUE. Performed at [facility], [place] [address], [place], [place] [address]    Culture STAPHYLOCOCCUS EPIDERMIDIS (A)  Final   Report Status 06/02/2021 FINAL  Final   Organism ID, Bacteria STAPHYLOCOCCUS EPIDERMIDIS  Final      Susceptibility   Staphylococcus epidermidis - MIC*    CIPROFLOXACIN >=8 RESISTANT Resistant     ERYTHROMYCIN <=0.25 SENSITIVE Sensitive     GENTAMICIN <=0.5 SENSITIVE Sensitive     OXACILLIN >=4 RESISTANT Resistant     TETRACYCLINE 2 SENSITIVE Sensitive     VANCOMYCIN <=0.5 SENSITIVE Sensitive     TRIMETH/SULFA 80 RESISTANT Resistant     CLINDAMYCIN <=0.25 SENSITIVE Sensitive     RIFAMPIN <=0.5 SENSITIVE Sensitive     Inducible Clindamycin NEGATIVE Sensitive     * STAPHYLOCOCCUS EPIDERMIDIS  Culture, blood (Routine X 2) w Reflex to ID Panel     Status: None (Preliminary result)   Collection Time: 06/02/21  2:17 PM   Specimen: BLOOD LEFT HAND  Result Value Ref Range Status   Specimen Description BLOOD LEFT HAND  Final   Special Requests   Final    BOTTLES DRAWN AEROBIC AND ANAEROBIC Blood Culture adequate volume   Culture   Final    NO GROWTH 2 DAYS Performed at [facility], [address], [place], [place] [address]    Report Status PENDING  Incomplete  Culture, blood (Routine X 2) w Reflex to ID Panel     Status: None (Preliminary result)   Collection Time: 06/02/21  2:22 PM   Specimen: BLOOD  Result Value Ref Range Status   Specimen Description BLOOD LEFT ANTECUBITAL  Final   Special Requests   Final    BOTTLES DRAWN AEROBIC AND ANAEROBIC Blood Culture adequate volume   Culture   Final    NO GROWTH 2 DAYS Performed at [facility], [place] [address], [place], [place] [address]    Report Status PENDING  Incomplete         Radiology Studies: CT Angio Chest Pulmonary Embolism (PE) W or WO Contrast  Result Date: 06/03/2021 CLINICAL DATA:  Shortness of breath EXAM: CT ANGIOGRAPHY CHEST WITH CONTRAST TECHNIQUE: Multidetector CT imaging of the chest was performed using the standard protocol during bolus administration of intravenous contrast. Multiplanar CT image reconstructions and MIPs were obtained to evaluate the vascular anatomy. CONTRAST:  89m OMNIPAQUE IOHEXOL 350 MG/ML SOLN COMPARISON:  January 11, 2021. FINDINGS: Cardiovascular: Satisfactory opacification of the pulmonary arteries to  the segmental level. No evidence of pulmonary embolism. Cardiomegaly. Small pericardial effusion. Mediastinum/Nodes: Scattered thyroid nodules, the largest of which measures 12 mm. No mediastinal hilar adenopathy. No axillary adenopathy. Lungs/Pleura: Tracheostomy. LEFT basilar atelectasis. No pleural effusion or pneumothorax. Upper Abdomen: Enteric tube tip terminates in the proximal duodenum. Musculoskeletal: No chest wall abnormality. No acute or significant osseous findings. Review of the MIP images confirms the above findings. IMPRESSION: 1. No evidence of pulmonary embolism. 2. Cardiomegaly. 3. Scattered thyroid nodules. Not clinically significant; no follow-up imaging recommended (ref: J Am Coll Radiol. 2015 Feb;12(2): 143-50). Electronically Signed   By: Valentino Saxon MD    On: 06/03/2021 17:39   DG CHEST PORT 1 VIEW  Result Date: 06/02/2021 CLINICAL DATA:  Fever. EXAM: PORTABLE CHEST 1 VIEW COMPARISON:  05/30/2021 FINDINGS: Stable enlarged cardiac silhouette. Tracheostomy tube in satisfactory position. Feeding tube extending into the stomach. Dense left lower lobe airspace consolidation is again demonstrated. Mild patchy density at the medial right lung base. Unremarkable bones. IMPRESSION: 1. Stable dense left lower lobe atelectasis or pneumonia. 2. Interval mild patchy atelectasis or pneumonia at the medial right lung base. 3. Stable cardiomegaly. Electronically Signed   By: Claudie Revering M.D.   On: 06/02/2021 15:55       Scheduled Meds:  amiodarone  200 mg Per Tube Daily   amLODipine  10 mg Per Tube Daily   aspirin  81 mg Per Tube Daily   atorvastatin  40 mg Per Tube Daily   carvedilol  25 mg Per Tube BID WC   chlorhexidine gluconate (MEDLINE KIT)  15 mL Mouth Rinse BID   Chlorhexidine Gluconate Cloth  6 each Topical Daily   cloNIDine  0.1 mg Per Tube TID   feeding supplement (PROSource TF)  45 mL Per Tube BID   ferrous sulfate  300 mg Per Tube BID WC   free water  200 mL Per Tube Q6H   hydrALAZINE  100 mg Per Tube Q8H   isosorbide dinitrate  40 mg Per Tube TID   mouth rinse  15 mL Mouth Rinse 10 times per day   megestrol  80 mg Per Tube TID   multivitamin with minerals  1 tablet Per Tube Daily   pantoprazole sodium  40 mg Per Tube Daily   sacubitril-valsartan  1 tablet Per Tube BID   sodium chloride flush  10-40 mL Intracatheter Q12H   Continuous Infusions:  feeding supplement (OSMOLITE 1.5 CAL) 1,000 mL (06/02/21 1455)     LOS: 20 days      Debbe Odea, MD [facility] Pager: [email or web address] 06/04/2021, 2:25 PM

## 2021-06-04 NOTE — Progress Notes (Addendum)
STROKE TEAM PROGRESS NOTE   INTERVAL HISTORY  No family at the bedside.   Neuro stable, she is alert and following commands on the right.   .    Vital signs are stable, last fevered yesterday. White count has normalized.   Trauma team is evaluating her for PEG.  Vitals:   06/04/21 0727 06/04/21 0806 06/04/21 1215 06/04/21 1244  BP: 136/71  128/65 (!) 141/75  Pulse: 94 97  98  Resp: 20 (!) 22  20  Temp: 99.7 F (37.6 C)   100 F (37.8 C)  TempSrc: Oral   Oral  SpO2: 100%   100%  Weight:      Height:       CBC:  Recent Labs  Lab 05/31/21 0314 06/01/21 0255 06/03/21 0903 06/04/21 0457  WBC 13.1*   < > 11.5* 8.9  NEUTROABS 11.1*  --   --   --   HGB 8.0*   < > 8.6* 7.6*  HCT 27.2*   < > 29.0* 26.2*  MCV 76.6*   < > 79.2* 79.6*  PLT 533*   < > 693* 633*   < > = values in this interval not displayed.   Basic Metabolic Panel:  Recent Labs  Lab 06/03/21 1613 06/04/21 0457  NA 136 136  K 5.2* 4.5  CL 108 105  CO2 19* 22  GLUCOSE 81 127*  BUN 20 21*  CREATININE 1.09* 1.10*  CALCIUM 9.6 9.3   Lipid Panel:  No results for input(s): CHOL, TRIG, HDL, CHOLHDL, VLDL, LDLCALC in the last 168 hours.  HgbA1c:  No results for input(s): HGBA1C in the last 168 hours. Urine Drug Screen:  No results for input(s): LABOPIA, COCAINSCRNUR, LABBENZ, AMPHETMU, THCU, LABBARB in the last 168 hours.  Alcohol Level  No results for input(s): ETH in the last 168 hours.  IMAGING  CT HEAD WO CONTRAST Result Date: 05/16/2021  IMPRESSION:  1. Continued interval evolution of moderately large right MCA distribution infarct. Associated regional mass effect has mildly increased, with new 2 mm of right-to-left midline shift. No hydrocephalus or trapping.  2. Decreased prominence of subarachnoid hemorrhage at the posterior right Sylvian fissure and overlying right frontoparietal region. Trace intraventricular hemorrhage related to redistribution.  3. Question new area of parenchymal hypodensity  involving the lateral left cerebellum. While this finding is favored to be artifactual, a possible new acute ischemic infarct is difficult to exclude, and could be considered in the correct clinical setting. Attention at follow-up recommended.  4. No other new acute intracranial abnormality.   CT HEAD WO CONTRAST Result Date: 05/15/2021  IMPRESSION: Small-to-moderate volume hyperdensity within the right sylvian fissure and along portions of the right frontoparietal and temporal lobes compatible with extravasated contrast/subarachnoid hemorrhage. There is hyperdensity within the right basal ganglia, right insula, lateral right frontal lobe/frontal operculum and within portions of the right parietal lobe likely reflecting contrast staining at sites of acute infarction. Mild associated mass effect with subtle partial effacement of the right lateral ventricle. No midline shift or hydrocephalus. Embolization material now overlies the lateral right parietal lobe. Stable background parenchymal atrophy and chronic small vessel ischemic disease. Redemonstrated chronic infarct within the superior left cerebellum.   MR BRAIN WO CONTRAST Result Date: 05/15/2021 IMPRESSION:  1. Moderately large acute right MCA infarct. Associated petechial hemorrhage, subarachnoid hemorrhage, and minimal intraventricular hemorrhage.  2. Severe chronic small vessel ischemic disease with multiple chronic infarcts.   MR CERVICAL SPINE WO CONTRAST Result Date: 05/15/2021  IMPRESSION: The  cord itself appears normal. No disc disease. No spinal stenosis. 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 No significant encroachment upon the neural spaces. Some prevertebral soft tissue edema. In this clinical scenario, this may be subsequent to previous endotracheal intubation. One could not rule out pharyngitis or cellulitis but that seems less likely. I do not see any traumatic injury to the cervical spine itself. Electronically  Signed   By: Nelson Chimes M.D.   On: 05/15/2021 16:05   IR CT Head Ltd Result Date: 05/15/2021 CT of the head was obtained and post processed in a separate workstation with concurrent attending physician supervision. Selected images were sent to PACS. Small right sylvian contrast extravasation confirmed. Additionally, hyperdensity of the right basal ganglia and lateral right frontal cortex are noted, related to ongoing ischemia. Right internal carotid artery angiograms with frontal and lateral views of the head showed occlusion of the embolized branch with no evidence of contrast extravasation. Patency of the remaining MCA branches noted.  IR PERCUTANEOUS ART THROMBECTOMY/INFUSION INTRACRANIAL INC DIAG ANGIO Result Date: 05/15/2021 IMPRESSION: Mechanical thrombectomy performed for treatment of right M1/MCA occlusion with clot fragmentation and embolization to same distal territory requiring multiple additional passes. Procedure complicated by contrast extravasation at a distal right M3/MCA branch requiring vessel embolization. Near complete recanalization of the right MCA vascular tree was achieved (TICI 2b). PLAN: - Bed rest post femoral access x6h - Follow-up CT in 4 hours to evaluate for SAH/contrast extravasation stability - Transfer to ICU- SBP: 120-140 mmHg   Echo Result read: 05/16/2021 1. Left ventricular ejection fraction, by estimation, is 55 to 60%. The  left ventricle has normal function. The left ventricle has no regional  wall motion abnormalities. There is moderate concentric left ventricular  hypertrophy. Left ventricular  diastolic parameters are consistent with Grade II diastolic dysfunction  (pseudonormalization). Elevated left atrial pressure.   2. Right ventricular systolic function is normal. The right ventricular  size is normal. Mildly increased right ventricular wall thickness. There  is moderately elevated pulmonary artery systolic pressure. The estimated  right  ventricular systolic pressure   is 64.4 mmHg.   3. Left atrial size was severely dilated.   4. Right atrial size was severely dilated.   5. There is no evidence of cardiac tamponade.   6. The mitral valve is normal in structure. Trivial mitral valve  regurgitation. No evidence of mitral stenosis.   7. The aortic valve is normal in structure. There is mild calcification  of the aortic valve. There is mild thickening of the aortic valve. Aortic  valve regurgitation is not visualized. Mild aortic valve sclerosis is  present, with no evidence of aortic  valve stenosis.   8. The inferior vena cava is dilated in size with <50% respiratory  variability, suggesting right atrial pressure of 15 mmHg.   PHYSICAL EXAM  Temp:  [98 F (36.7 C)-100.7 F (38.2 C)] 100 F (37.8 C) (06/13 1244) Pulse Rate:  [84-105] 98 (06/13 1244) Resp:  [16-23] 20 (06/13 1244) BP: (126-146)/(62-76) 141/75 (06/13 1244) SpO2:  [96 %-100 %] 100 % (06/13 1244) FiO2 (%):  [21 %] 21 % (06/13 1215)  General - Well nourished, well developed middle-aged African-American lady, s/p trach and PEG.   Ophthalmologic - fundi not visualized due to noncooperation.  Cardiovascular - Regular rhythm and rate  Neuro -   s/p trach on trach collar., eyes open able to follow commands on the right hand and foot consistently and able to gesture  with her hand in response to questions.  Forced eye opening, eyes in right forced gaze preference, not blinking to visual threat on the left, not tracking, PERRL.  Mild left lower facial asymmetry.   Left UE/LE are flaccid. Right UE 3/5 proximal >distal RLE moves consistently in bed.  Babinski (+) L.  Sensation intact on right  Hypercoagulation panel.  Ref. Range 05/26/2021 11:04  PTT Lupus Anticoagulant Latest Ref Range: 0.0 - 51.9 sec 46.4  DRVVT Latest Ref Range: 0.0 - 47.0 sec 80.2 (H)  dRVVT Mix Latest Ref Range: 0.0 - 40.4 sec 57.3 (H)  dRVVT Confirm Latest Ref Range: 0.8 - 1.2 ratio  1.6 (H)    Ref. Range 06/03/2021 10:08  D-Dimer, Quant Latest Ref Range: 0.00 - 0.50 ug/mL-FEU 8.53 (H)    Ref. Range 05/26/2021 11:04  Anticardiolipin Ab,IgM,Qn Latest Ref Range: 0 - 12 MPL U/mL 17 (H)  PTT Lupus Anticoagulant Latest Ref Range: 0.0 - 51.9 sec 46.4  DRVVT Latest Ref Range: 0.0 - 47.0 sec 80.2 (H)  Phosphatydalserine, IgG Latest Ref Range: 0 - 30 Units 9  Phosphatydalserine, IgM Latest Ref Range: 0 - 30 Units 102 (H)    ASSESSMENT/PLAN Ms. Rhianne Soman is a 45 y.o. female with history of uncontrolled HTN, tobacco use disorder, large uterine fibroids, heart failure, paroxysmal SVT, paranoid psychosis who presents with altered mental status after being found down.  CT head initially read as small acute appearing perforator infarct in the R BG and notable for severe small vessel ischemia. However concerning for potential R MCA stroke and thus CTA head and neck and CTP was obtained with a R MCA M1 occlusion and a core of 50cc and 32ml of penumbra.   Stroke:  right MCA stroke due to right M1 occlusion s/p IR with TICI 2b, complicated by rupture of right M3 requiring embolization, embolic pattern, source unclear CT head: Acute appearing perforator infarct at the right basal ganglia.  CTA neck: LVO right M1 origin. Left vertebral occlusion at its origin. High-grade narrowings of the right cavernous ICA. CT perfusion suggestive 50 cc core infarct and 50 cc of adjacent penumbra. MRI: large acute right MCA infarct. Associated petechial hemorrhage, subarachnoid hemorrhage, and minimal intraventricular hemorrhage. CT 5/28 stable right MCA infarct with MLS CT 5/30 stable right MCA infarct and MLS 25mm 2D Echo 55 to 60% Hypercoagulable panel showed:  Anticardiolipin Ab,IgM - Indeterminate DRVVT >80 (low med to high) dRVVT Mix ratio >10 dRVVT Confirm ratio >1.2 Results suggest the presence of lupus anticoagulant however at this time recommend repeating the hypercoagulable panel in 12  weeks given hypercoagulable labs were done during acute hospitalization and results could be inaccurate. There is no need to consult hematology at this time.  Homocystine is normal.   UDS: negative LDL 76 HgbA1c 5.4 UDS neg Sickle cell screen - neg, ANA neg VTE prophylaxis - SCDs No antithrombotic prior to admission, now on ASA 81. Therapy recommendations:  CIR Disposition:  Pending  Cytotoxic cerebral edema On 3% saline @ 30 -> FW 100 Q4. Stopped. Now on free water through [place] Na 157->156->157->155->157->153->150->150 >147 CT head 5/30 stable right MCA infarcts and MLS 24mm OK to gradually normalize Na level  Respiratory failure Intubated 5/26 for increased work of breathing with altered mental status  Management per CCM, appreciated Switched to precedex -> now off  Not candidate for extubation S/p trach 6/3 with CCM, Trach collar today  Acute blood loss anemia Large uterine fibroids hgb 7.8->5.9->PRBC->7.6->6.5->PRBC->7.2->6.7->PRBC->8.5->7.7->7.3->7.6 >7.5>9/1 PRBC tranfusion x  3 extensive virginal bleeding from extremely large abdominopelvic mass measuring up to 22.9 cm noted on CT 5/26 Has been followed with Dr. Jonna Clark - poor surgical candidate due to uncontrolled HTN and SVT Abdominal distention on exam Increase Megace to megace 80mg  tid OBGYN Dr. Rosana Hoes consulted, appreciate help - not surgical candidate  Dysphagia Likely due to stroke SLP following Has cortrak, peg is being considered  On TF @ 50 and FW  Paroxysmal SVT history Hx of CHF Followed with cardiology  EF 40-45% in 04/2020 and 20-25% in 12/2020 This admission EF 55-60% On Amiodarone 200mg  QD, Isosorbide 20 TID and Entresto 97-103 BID  Currently heart rate controlled Consider TEE/Loop recorder prior to discahrge if able to tolerate  Hypertension Home meds:  Hydralazine 100mg  TID, Imdur 60mg  daily, Toprol XL 200mg  daily Currently on Amlodipine 10 mg QD, Coreg 25 mg BID, Clonidine 0.1 mg TID,  Hydralazine 100 mg Q8 SBP goal to <160 mmHg Labetalol PRN  Long-term BP goal normotensive, avoid aggressively lowering blood pressure   Hyperlipidemia Home meds:  none LDL 76, goal < 70 Atorvastatin 40mg  daily Continue statin at discharge  Tobacco abuse Current smoker Smoking cessation counseling will be provided  Fever and Leukocytosis WBC previously elevated in the 20s, has down trended and normalized as of 6/13. CXR w/atelectasis vs consolidation of LLL. BC earlier in admission showed G+ cocci in clusters. Sputum culture w/staph aureus. She completed abx for MSSA pneumonia   Other Stroke Risk Factors Coronary artery disease   Hospital day #20  Ruta Hinds, NP  Stroke Nurse Practitioner  06/04/2021 2:23 PM  ATTENDING NOTE: I reviewed above note and agree with the assessment and plan. Pt was seen and examined.   Patient lying in bed, eyes open, not in distress.  On trach collar, still has copious secretions.  Follow simple peripheral commands on the right hand and foot.  Still has right gaze preference, barely cross midline.  Left facial droop, left arm and leg hemiplegic.  With pain post ablation, left lower extremity slight withdrawal.  Right upper and lower extremity spontaneous movement against gravity.  Yesterday T-max 100.7, today 100.  Off antibiotics at this time.  Continued on tube feeding and free water.  Hemoglobin stable today hemoglobin 7.6, continue iron pills and Megace.  BP stable on multiple BP meds.  Consider further embolic work-up if patient has significant neuro improvement.  Trauma surgery on board, pending scheduled for PEG placement.  Hypercoagulable labs with several positive lab values, however, need to repeat in 12 weeks before making diagnosis.  If still elevated at that time, hematology consultation will be considered.  Continue aspirin and statin.  For detailed assessment and plan, please refer to above as I have made changes wherever  appropriate.   Neurology will sign off. Please call with questions. Pt will follow up with stroke clinic Dr. Leonie Man at [facility] in about 4 weeks. Thanks for the consult.   Rosalin Hawking, MD PhD Stroke Neurology 06/04/2021 7:16 PM    To contact Stroke Continuity provider, please refer to [email or web address] After hours, contact General Neurology

## 2021-06-04 NOTE — Progress Notes (Signed)
[facility] Progress Note  20 Days Post-Op  Subjective: CC:  Alert, NAD.  Non-verbal but following commands and nodding head yes/no to my questions.  Denies pain.  When I asked her if she would want surgery to move her feeding tube from her nose to her abdominal wall she seemed unsure, shaking her head no. I asked if she had family helping her make these decisions and she nodded her head yes and gave me permission to call her family.   Objective: Vital signs in last 24 hours: Temp:  [98 F (36.7 C)-100.7 F (38.2 C)] 99.7 F (37.6 C) (06/13 0727) Pulse Rate:  [84-105] 97 (06/13 0806) Resp:  [15-23] 22 (06/13 0806) BP: (126-152)/(62-78) 136/71 (06/13 0727) SpO2:  [96 %-100 %] 100 % (06/13 0727) FiO2 (%):  [21 %] 21 % (06/13 0806) Last BM Date: 06/03/21  Intake/Output from previous day: 06/12 0701 - 06/13 0700 In: -  Out: 2000 [Urine:2000] Intake/Output this shift: No intake/output data recorded.  PE: Gen:  Alert, NAD, cooperative Head: cortrak in R nare w/ TF running at 50 cc/hr Neck: trach in place on TC Card:  Regular rate and rhythm, pedal pulses 2+ BL Pulm:  Normal effort, rales left lung base Abd: Soft, non-tender, non-distended, large, palpable mass from pelvis up past umbilicus. Skin: warm and dry, no rashes  Psych: A&Ox3   Lab Results:  Recent Labs    06/03/21 0903 06/04/21 0457  WBC 11.5* 8.9  HGB 8.6* 7.6*  HCT 29.0* 26.2*  PLT 693* 633*   BMET Recent Labs    06/03/21 1613 06/04/21 0457  NA 136 136  K 5.2* 4.5  CL 108 105  CO2 19* 22  GLUCOSE 81 127*  BUN 20 21*  CREATININE 1.09* 1.10*  CALCIUM 9.6 9.3   PT/INR No results for input(s): LABPROT, INR in the last 72 hours. CMP     Component Value Date/Time   NA 136 06/04/2021 0457   K 4.5 06/04/2021 0457   CL 105 06/04/2021 0457   CO2 22 06/04/2021 0457   GLUCOSE 127 (H) 06/04/2021 0457   BUN 21 (H) 06/04/2021 0457   CREATININE 1.10 (H) 06/04/2021 0457   CALCIUM 9.3  06/04/2021 0457   PROT 6.1 (L) 06/04/2021 0457   ALBUMIN 2.0 (L) 06/04/2021 0457   AST 26 06/04/2021 0457   ALT 13 06/04/2021 0457   ALKPHOS 144 (H) 06/04/2021 0457   BILITOT 0.4 06/04/2021 0457   GFRNONAA >60 06/04/2021 0457   Lipase  No results found for: LIPASE     Studies/Results: CT Angio Chest Pulmonary Embolism (PE) W or WO Contrast  Result Date: 06/03/2021 CLINICAL DATA:  Shortness of breath EXAM: CT ANGIOGRAPHY CHEST WITH CONTRAST TECHNIQUE: Multidetector CT imaging of the chest was performed using the standard protocol during bolus administration of intravenous contrast. Multiplanar CT image reconstructions and MIPs were obtained to evaluate the vascular anatomy. CONTRAST:  31mL OMNIPAQUE IOHEXOL 350 MG/ML SOLN COMPARISON:  January 11, 2021. FINDINGS: Cardiovascular: Satisfactory opacification of the pulmonary arteries to the segmental level. No evidence of pulmonary embolism. Cardiomegaly. Small pericardial effusion. Mediastinum/Nodes: Scattered thyroid nodules, the largest of which measures 12 mm. No mediastinal hilar adenopathy. No axillary adenopathy. Lungs/Pleura: Tracheostomy. LEFT basilar atelectasis. No pleural effusion or pneumothorax. Upper Abdomen: Enteric tube tip terminates in the proximal duodenum. Musculoskeletal: No chest wall abnormality. No acute or significant osseous findings. Review of the MIP images confirms the above findings. IMPRESSION: 1. No evidence of pulmonary embolism. 2. Cardiomegaly.  3. Scattered thyroid nodules. Not clinically significant; no follow-up imaging recommended (ref: J Am Coll Radiol. 2015 Feb;12(2): 143-50). Electronically Signed   By: Valentino Saxon MD   On: 06/03/2021 17:39   DG CHEST PORT 1 VIEW  Result Date: 06/02/2021 CLINICAL DATA:  Fever. EXAM: PORTABLE CHEST 1 VIEW COMPARISON:  05/30/2021 FINDINGS: Stable enlarged cardiac silhouette. Tracheostomy tube in satisfactory position. Feeding tube extending into the stomach. Dense left  lower lobe airspace consolidation is again demonstrated. Mild patchy density at the medial right lung base. Unremarkable bones. IMPRESSION: 1. Stable dense left lower lobe atelectasis or pneumonia. 2. Interval mild patchy atelectasis or pneumonia at the medial right lung base. 3. Stable cardiomegaly. Electronically Signed   By: Claudie Revering M.D.   On: 06/02/2021 15:55    Anti-infectives: Anti-infectives (From admission, onward)    Start     Dose/Rate Route Frequency Ordered Stop   05/30/21 1515  ceFAZolin (ANCEF) IVPB 2g/100 mL premix  Status:  Discontinued        2 g 200 mL/hr over 30 Minutes Intravenous Every 8 hours 05/30/21 1415 05/31/21 1404   05/21/21 1100  ceFAZolin (ANCEF) IVPB 2g/100 mL premix        2 g 200 mL/hr over 30 Minutes Intravenous Every 8 hours 05/21/21 1030 05/28/21 0537        Assessment/Plan  Dysphagia Consult for PEG placement - tolerating TF via cortrak at 50 cc/hr and having BMs. - patient also with 22.9 x 22.9 x 13.8 cm uterine fibroid and abdomen firm on palpation - patient seems to be clinically improving from MSSA PNA and possible bacteremia, now off of abx and afebrile for about 24 hours. Does have persistent effusion/consolidation of LLL.  - failed swallow eval earlier in admission, s/p trach now but still has cuffed trach - consider re-eval once changed to cuffless trach; per CCM note 6/9 planning for possible transition to cuffless trach tomorrow 6/14 -- may be worth while to see if this is possible and get repeat speech eval prior to commiting to surgical G- tube. - will discuss open G tube with patients family as well as with Dr. Grandville Silos.    - below per primary attending - R MCA CVA complicated by [facility] with midline shift and cerebral edema Acute on chronic anemia, multifactorial AKI S/P tracheostomy MSSA PNA - completed course of ancef x 7 days ?staph epidermis bacteremia - blood cx 5/30 positive for staph epidermis and staph hominis, per ID monitor  off of abx for now, staph epi should have been covered by 7d ancef.  Leukocytosis - resolved  T2DM controlled with diet/exercise Uncontrolled HTN HLD CHF with pEF SVT Uterine fibroids Hx of paranoid psychosis    LOS: 20 days    Obie Dredge, [facility] Please see Amion for pager number during day hours 7:00am-4:30pm

## 2021-06-04 NOTE — Progress Notes (Signed)
RT attempted to remove sutures. Top right suture will not come out. RT will notify MD to come and attempt removal.

## 2021-06-04 NOTE — Progress Notes (Signed)
[facility] for Infectious Disease   Reason for visit: Follow up on fever  Interval History: remains afebrile today, last fever yesterday afternoon of 100.7.  WBC wnl.  No new complaints except felt hot (expresses fanning herself).   Remains off of antibiotics.  UA without significant concerns from 6/10  Physical Exam: Constitutional:  Vitals:   06/04/21 0727 06/04/21 0806  BP: 136/71   Pulse: 94 97  Resp: 20 (!) 22  Temp: 99.7 F (37.6 C)   SpO2: 100%    patient appears in NAD Respiratory: diffuse rhonchi; on trach collar Cardiovascular: RRR GI: soft, nt, nd  Review of Systems: Constitutional: negative for chills and sweats Gastrointestinal: negative for diarrhea Integument/breast: negative for rash  Lab Results  Component Value Date   WBC 8.9 06/04/2021   HGB 7.6 (L) 06/04/2021   HCT 26.2 (L) 06/04/2021   MCV 79.6 (L) 06/04/2021   PLT 633 (H) 06/04/2021    Lab Results  Component Value Date   CREATININE 1.10 (H) 06/04/2021   BUN 21 (H) 06/04/2021   NA 136 06/04/2021   K 4.5 06/04/2021   CL 105 06/04/2021   CO2 22 06/04/2021    Lab Results  Component Value Date   ALT 13 06/04/2021   AST 26 06/04/2021   ALKPHOS 144 (H) 06/04/2021     Microbiology: Recent Results (from the past 240 hour(s))  Culture, Respiratory w Gram Stain     Status: None   Collection Time: 05/28/21  7:43 AM   Specimen: Tracheal Aspirate; Respiratory  Result Value Ref Range Status   Specimen Description TRACHEAL ASPIRATE  Final   Special Requests NONE  Final   Gram Stain   Final    FEW WBC PRESENT,BOTH PMN AND MONONUCLEAR RARE GRAM POSITIVE COCCI IN PAIRS    Culture   Final    Normal respiratory flora-no Staph aureus or Pseudomonas seen Performed at [facility], [address], [place], [place] [address]    Report Status 05/30/2021 FINAL  Final  Culture, blood (routine x 2)     Status: Abnormal   Collection Time: 05/28/21  8:27 AM   Specimen: BLOOD  Result Value  Ref Range Status   Specimen Description BLOOD RIGHT ANTECUBITAL  Final   Special Requests   Final    BOTTLES DRAWN AEROBIC AND ANAEROBIC Blood Culture adequate volume   Culture  Setup Time   Final    GRAM POSITIVE COCCI IN CLUSTERS AEROBIC BOTTLE ONLY CRITICAL VALUE NOTED.  VALUE IS CONSISTENT WITH PREVIOUSLY REPORTED AND CALLED VALUE. Performed at [facility], [place] [address], [place], [place] [address]    Culture STAPHYLOCOCCUS EPIDERMIDIS (A)  Final   Report Status 05/31/2021 FINAL  Final   Organism ID, Bacteria STAPHYLOCOCCUS EPIDERMIDIS  Final      Susceptibility   Staphylococcus epidermidis - MIC*    CIPROFLOXACIN >=8 RESISTANT Resistant     ERYTHROMYCIN >=8 RESISTANT Resistant     GENTAMICIN <=0.5 SENSITIVE Sensitive     OXACILLIN >=4 RESISTANT Resistant     TETRACYCLINE 2 SENSITIVE Sensitive     VANCOMYCIN <=0.5 SENSITIVE Sensitive     TRIMETH/SULFA 80 RESISTANT Resistant     CLINDAMYCIN >=8 RESISTANT Resistant     RIFAMPIN <=0.5 SENSITIVE Sensitive     Inducible Clindamycin NEGATIVE Sensitive     * STAPHYLOCOCCUS EPIDERMIDIS  Culture, blood (routine x 2)     Status: Abnormal   Collection Time: 05/28/21  8:28 AM  Specimen: BLOOD  Result Value Ref Range Status   Specimen Description BLOOD RIGHT ANTECUBITAL  Final   Special Requests   Final    BOTTLES DRAWN AEROBIC AND ANAEROBIC Blood Culture adequate volume   Culture  Setup Time   Final    GRAM POSITIVE COCCI IN CLUSTERS AEROBIC BOTTLE ONLY CRITICAL VALUE NOTED.  VALUE IS CONSISTENT WITH PREVIOUSLY REPORTED AND CALLED VALUE. Performed at [facility], [place] [address] [place], [place] [address]    Culture STAPHYLOCOCCUS EPIDERMIDIS (A)  Final   Report Status 06/02/2021 FINAL  Final   Organism ID, Bacteria STAPHYLOCOCCUS EPIDERMIDIS  Final      Susceptibility   Staphylococcus epidermidis - MIC*    CIPROFLOXACIN >=8 RESISTANT Resistant     ERYTHROMYCIN <=0.25 SENSITIVE Sensitive     GENTAMICIN <=0.5  SENSITIVE Sensitive     OXACILLIN >=4 RESISTANT Resistant     TETRACYCLINE 2 SENSITIVE Sensitive     VANCOMYCIN <=0.5 SENSITIVE Sensitive     TRIMETH/SULFA 80 RESISTANT Resistant     CLINDAMYCIN <=0.25 SENSITIVE Sensitive     RIFAMPIN <=0.5 SENSITIVE Sensitive     Inducible Clindamycin NEGATIVE Sensitive     * STAPHYLOCOCCUS EPIDERMIDIS  Culture, blood (Routine X 2) w Reflex to ID Panel     Status: None (Preliminary result)   Collection Time: 06/02/21  2:17 PM   Specimen: BLOOD LEFT HAND  Result Value Ref Range Status   Specimen Description BLOOD LEFT HAND  Final   Special Requests   Final    BOTTLES DRAWN AEROBIC AND ANAEROBIC Blood Culture adequate volume   Culture   Final    NO GROWTH 2 DAYS Performed at [facility], [address], [place], [place] [address]    Report Status PENDING  Incomplete  Culture, blood (Routine X 2) w Reflex to ID Panel     Status: None (Preliminary result)   Collection Time: 06/02/21  2:22 PM   Specimen: BLOOD  Result Value Ref Range Status   Specimen Description BLOOD LEFT ANTECUBITAL  Final   Special Requests   Final    BOTTLES DRAWN AEROBIC AND ANAEROBIC Blood Culture adequate volume   Culture   Final    NO GROWTH 2 DAYS Performed at [facility], [place] [address], [place], [place] [address]    Report Status PENDING  Incomplete    Impression/Plan:  1. Fever - no clear signs of infection at this time.  Mild fever yesterday to 100.7.  No antibiotic indicated at this time or further evaluation based on no new symptoms.   Will continue to monitor  2.  Leukocytosis - now wnl today.  Will continue to monitor.  3. Thrombocytosis - mild and in the 600s, has not increased further.  Will continue to monitor.

## 2021-06-04 NOTE — Progress Notes (Addendum)
NAME:  Holly Bradley, MRN:  [id number], DOB:  03/16/1976, LOS: 25  ADMISSION DATE:  05/15/2021, CONSULTATION DATE:  06/04/21 REFERRING MD:  Holly Bradley, CHIEF COMPLAINT:  Respiratory failure   History of Present Illness:  Holly Bradley is a [name] with PMH of HTN, tobacco use, HFpF, DMT2 (controlled with diet and exercise), pSVT, uterine fibroids, paranoid psychosis who presented with AMS on 5/24.   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  On follow up imaging, she had stable appearing SAH and continued R basal ganglia infarct with 69mm R to L shift.   She was admitted to the Neuro ICU and treating with hypertonic saline, statin and BP control.  On 5/26, PCCM consulted when pt became acutely less responsive with respiratory distress along with abdominal distension worse than baseline, per patient.  She was intubated and repeat head CT, abd/pelvis CT ordered.  Repeat head CT on 5/26 showed increased cytotoxic edema and mildly increased leftward midline shift, now 5 mm, decreased SAH and IVH.  Abdominal CT showing 22.9 cm mass, possibly consistent with uterine fibroids, no lymphadenopathy.      Significant Hospital Events: Including procedures, antibiotic start and stop dates in addition to other pertinent events   5/24 Admit to neurology after ED visit with AMS, found to have R MCA stroke, underwent mechanical thrombectomy, M3 coil embolization, subsequent SAH 5/26 decreased mental status and respiratory distress, intubated  6/1 still not candidate for extubation with poor mentation and cough mechanics. Family unsure if pt would want trach. Staph epi bacteremia, continues on ancef  6/2 moving RUE RLE following some commands. decision to move forward with tracheostomy.   6/3 perc trach. Slight WBC increase but no  fever, continues on ancef  6/4 Tolerating PSV/CPAP  6/5 WBC up to 17, last day of ancef for staph pna. Trying ATC  6/9 ID consulted for staph epi bacteremia . Felt contamination and stopped abx.  6/12 had fever spikes but nothing to explain  6/13 change to cuffless trach   Interim History / Subjective:  Looks same  Objective   Blood pressure 136/71, pulse 97, temperature 99.7 F (37.6 C), temperature source Oral, resp. rate (Abnormal) 22, height 5\' 5"  (1.651 m), weight 76.6 kg, SpO2 100 %.    FiO2 (%):  [21 %] 21 %   Intake/Output Summary (Last 24 hours) at 06/04/2021 0951 Last data filed at 06/04/2021 0440 Gross per 24 hour  Intake no documentation  Output 2000 ml  Net -2000 ml   Filed Weights   05/30/21 0322 05/31/21 0355 05/31/21 1528  Weight: 74 kg 80.2 kg 76.6 kg   General 45 year old female resting in bed no distress HENT NCAT no JVD cuffed trach w/ sutures intact Pulm scattered rhonchi  Card rrr Abd soft tol TFs Ext warms  Neuro left sided hemiparesis. Follows commands. Nods appropriately.   Labs/imaging that I havepersonally reviewed  (right click and "Reselect all SmartList Selections" daily)  See below   Assessment & Plan:   Acute respiratory failure with hypoxia S/p tracheostomy MSSA pneumonia (completed rx) Acute encephalopathy: Multifactorial, in setting of intracranial process below, delirium, infection/metabolic abnormalities  R MCA CVA complicated with with SAH and midline shift, cerebral edema  Staph epidermis bacteremia (not clear if contaminant; treated) Sepsis due to staph a. 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   Plan Cont routine trach care  Not candidate for decannulation at this point. Possibly in future depending on level of improvement over next several months.  Remove trach sutures today Change to cuffless trach after PEG placed in case needs assisted ventilation for procedure   All other concerns per IM service   Best Practice (right click and "Reselect all SmartList Selections" daily)   Diet/type: tubefeeds Pain/Anxiety/Delirium protocol Not indicated VAP protocol (if indicated): Not indicated DVT prophylaxis: SCD GI prophylaxis: PPI Glucose control:  not indicated Central venous access:  N/A Arterial line:  N/A Foley:  N/A Mobility:  OOB  PT consulted: Yes Studies pending: None Culture data pending:blood Last reviewed culture data:today Antibiotics:not indicated  Antibiotic de-escalation: N/A Stop date: N/A Daily labs: not indicated Code Status:  limited Last date of multidisciplinary goals of care discussion [per primary ]  We will see Tuesday and thursdays for trach care.   Holly Bradley ACNP-BC [place] Pager # [phone] OR # [phone] if no answer     Attending attestation:  Holly Bradley was seen and examined at bedside. She is sleepy, but denies acute complaints.  Nonverbal. Sutures from trach removed this morning.  BP (!) 141/75   Pulse 98   Temp 100 F (37.8 C) (Oral)   Resp 20   Ht 5\' 5"  (1.651 m)   Wt 76.6 kg   LMP  (LMP Unknown) Comment: pt not alert  SpO2 100%   BMI 28.10 kg/m  Chronically ill appearing woman lying in bed in NAD [place]/AT Trach in place without bleeding, drainage, or erythema Breathing comfortably on TC, rhonchi bilaterally S1S2, RRR Abd firm in lower abdomen, otherwise soft,  NT Moves toes and squeezes fingers on R side only. In braces to prevent contractures on the left. Nods and shakes her head to answer questions but keeps her eyes closed. Not coughing on command. No significant LE edema, SCDs in place. No cyanosis. Skin warm, dry  Labs reviewed. Normal WBC-- trended down from recent leukocytosis. H/H 7.6/26.2, stable. Renal function stable.  CTA chest 6/12 reviewed> scars in bilateral bases, no opacities. Subpleural LLL nodule. Cardiomegaly, large PA. No PE.  Assessment & plan:  Acute respiratory failure due to CVA, tracheostomy dependent Previously treated MSSA pneumonia -con't routine trach care -adding CPT Q8h with suctioning as needed. Can add mucinex if secretions thinning is required. -agree with downsizing to cuffless after PEG placed; may need ventilation periprocedurally.   Rest of care per primary.  PCCM will continue to follow intermittently. Please call in the interim with questions.  Julian Hy, DO 06/04/21 2:43 PM [facility]

## 2021-06-04 NOTE — Progress Notes (Signed)
Physical Therapy Treatment Patient Details Name: Holly Bradley MRN: [id number] DOB: December 24, 1975 Today's Date: 06/04/2021    History of Present Illness 45 yo female s/p mechanical thrombectomy on 5/24 of right MCA M1/M2/M3 and embolization of right MCA M3 branch due to contrast extravasation. Pt re-intubated 5/26. Repeat head CT on 5/26 showed increased cytotoxic edema and mildly increased leftward midline shift, now 5 mm, decreased SAH and IVH. Abdominal CT showing 22.9 cm mass, possibly consistent with uterine fibroids, no lymphadenopathy. s/p trach 6/3. PMH fibriods HTN HF paranoid psychosis recent hospitalizations january and february 2022    PT Comments    Pt continues to have severe limitations on mobility. Will continue working on bed mobility, balance and progressing activity. Continue to recommend SNF.    Follow Up Recommendations  SNF     Equipment Recommendations  Wheelchair (measurements PT);Wheelchair cushion (measurements PT);Hospital bed;Other (comment) (hoyer lift)    Recommendations for Other Services       Precautions / Restrictions Precautions Precautions: Fall Precaution Comments: L hemiparesis, PRAFO, hand splint,  trach, SBP < 160, cortrak    Mobility  Bed Mobility Overal bed mobility: Needs Assistance             General bed mobility comments: Placed bed in chair position and worked on pt pulling trunk forward off of bed using her RUE and max assist. Performed x 7    Transfers                    Ambulation/Gait                 Stairs             Wheelchair Mobility    Modified Rankin (Stroke Patients Only) Modified Rankin (Stroke Patients Only) Pre-Morbid Rankin Score: No symptoms Modified Rankin: Severe disability     Balance Overall balance assessment: Needs assistance Sitting-balance support: Single extremity supported;Feet supported Sitting balance-Leahy Scale: Zero Sitting balance - Comments: Max assist to  maintain forward trunk from chair position in bed Postural control: Posterior lean;Left lateral lean                                  Cognition Arousal/Alertness: Awake/alert Behavior During Therapy: Flat affect Overall Cognitive Status: Difficult to assess                                 General Comments: Pt following 1 step commands with rt extremities. Nodding yes/no appropriately. Lt neglect.      Exercises General Exercises - Upper Extremity Shoulder Flexion: AROM;Right;10 reps;Supine General Exercises - Lower Extremity Short Arc Quad: AROM;Right;10 reps;Supine Heel Slides: AROM;10 reps;Supine;Right Hip ABduction/ADduction: AROM;Right;10 reps;Supine Other Exercises Other Exercises: Resisted RLE extension against manual resistance x 10 Other Exercises: splint check; no redness    General Comments        Pertinent Vitals/Pain Pain Assessment: Faces Faces Pain Scale: No hurt    Home Living                      Prior Function            PT Goals (current goals can now be found in the care plan section) Acute Rehab PT Goals Patient Stated Goal: none stated PT Goal Formulation: Patient unable to participate in goal setting Time For Goal Achievement: 06/18/21 Potential  to Achieve Goals: Fair Progress towards PT goals: Goals downgraded-see care plan    Frequency    Min 3X/week      PT Plan Current plan remains appropriate    Co-evaluation              AM-PAC PT "6 Clicks" Mobility   Outcome Measure  Help needed turning from your back to your side while in a flat bed without using bedrails?: Total Help needed moving from lying on your back to sitting on the side of a flat bed without using bedrails?: Total Help needed moving to and from a bed to a chair (including a wheelchair)?: Total Help needed standing up from a chair using your arms (e.g., wheelchair or bedside chair)?: Total Help needed to walk in hospital  room?: Total Help needed climbing 3-5 steps with a railing? : Total 6 Click Score: 6    End of Session Equipment Utilized During Treatment: Oxygen Activity Tolerance: Patient tolerated treatment well Patient left: in bed;with call bell/phone within reach;with bed alarm set   PT Visit Diagnosis: Hemiplegia and hemiparesis;Other abnormalities of gait and mobility (R26.89);Unsteadiness on feet (R26.81);Muscle weakness (generalized) (M62.81);Difficulty in walking, not elsewhere classified (R26.2);Other symptoms and signs involving the nervous system (R29.898);Apraxia (R48.2) Hemiplegia - Right/Left: Left Hemiplegia - dominant/non-dominant: Non-dominant Hemiplegia - caused by: Cerebral infarction;Nontraumatic SAH;Nontraumatic intracerebral hemorrhage     Time: 1231-1251 PT Time Calculation (min) (ACUTE ONLY): 20 min  Charges:  $Therapeutic Activity: 8-22 mins                     [place] Pager [phone] Office [place] 06/04/2021, 3:39 PM

## 2021-06-05 DIAGNOSIS — Z930 Tracheostomy status: Secondary | ICD-10-CM

## 2021-06-05 DIAGNOSIS — N939 Abnormal uterine and vaginal bleeding, unspecified: Secondary | ICD-10-CM

## 2021-06-05 LAB — CBC
HCT: 28.7 % — ABNORMAL LOW (ref 36.0–46.0)
Hemoglobin: 8.5 g/dL — ABNORMAL LOW (ref 12.0–15.0)
MCH: 23.4 pg — ABNORMAL LOW (ref 26.0–34.0)
MCHC: 29.6 g/dL — ABNORMAL LOW (ref 30.0–36.0)
MCV: 79.1 fL — ABNORMAL LOW (ref 80.0–100.0)
Platelets: 611 10*3/uL — ABNORMAL HIGH (ref 150–400)
RBC: 3.63 MIL/uL — ABNORMAL LOW (ref 3.87–5.11)
RDW: 32.2 % — ABNORMAL HIGH (ref 11.5–15.5)
WBC: 8.7 10*3/uL (ref 4.0–10.5)
nRBC: 0 % (ref 0.0–0.2)

## 2021-06-05 LAB — GLUCOSE, CAPILLARY
Glucose-Capillary: 105 mg/dL — ABNORMAL HIGH (ref 70–99)
Glucose-Capillary: 110 mg/dL — ABNORMAL HIGH (ref 70–99)
Glucose-Capillary: 115 mg/dL — ABNORMAL HIGH (ref 70–99)
Glucose-Capillary: 115 mg/dL — ABNORMAL HIGH (ref 70–99)
Glucose-Capillary: 117 mg/dL — ABNORMAL HIGH (ref 70–99)
Glucose-Capillary: 122 mg/dL — ABNORMAL HIGH (ref 70–99)
Glucose-Capillary: 95 mg/dL (ref 70–99)

## 2021-06-05 MED ORDER — JEVITY 1.5 CAL/FIBER PO LIQD
1000.0000 mL | ORAL | Status: DC
  Administered 2021-06-06 – 2021-06-13 (×6): 1000 mL
  Filled 2021-06-05 (×12): qty 1000

## 2021-06-05 NOTE — Progress Notes (Signed)
Nutrition Follow-up  DOCUMENTATION CODES:   Not applicable  INTERVENTION:  Continue Tube feeding via Cortrak: Jevity 1.5 at 50 ml/h (1200 ml per day) Prosource TF 45 ml BID   Provides 1880 kcal, 98 gm protein, 912 ml free water daily   MVI with minerals   200 ml free water every 6 hours Total free water: 1512 ml  Recommend considering discontinuing Megace while pt is NPO as it may be contributing to pt's feelings of hunger.  NUTRITION DIAGNOSIS:   Inadequate oral intake related to inability to eat as evidenced by NPO status.  ongoing  GOAL:   Patient will meet greater than or equal to 90% of their needs  Met with TF  MONITOR:   TF tolerance, Labs  REASON FOR ASSESSMENT:   Consult Enteral/tube feeding initiation and management  ASSESSMENT:   Pt with PMH of uncontrolled HTN, tobacco abuse, uterine fibroids, CHF, paroxysmal SVT, and paranoid psychosis admitted with significant encephalopathy and L sided weakness. 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  5/25 pt failed swallow eval; cortrak placed with tip gastric 5/26 pt intubated 5/30 positive for staph Epi. 6/3 s/p trach placement 6/4 tolerating PSV/CPAP 6/5 WBC up to 17, last day of ancef for staph PNA, trying ATC 6/9 ID consulted for staph epi bacteremia, felt contamination and stopped abx 6/12 fever spikes, origin unknown 6/13 change to cuffless trach  Pt continues to be NPO receiving TF via Cortrak. Current orders: Osmolite 1.5 @ 60m/hr w/ Prosource TF 470mBID and 20026mree water Q6H. Tolerating well per RN. SLP following and recommending PEG. MD consulted RD due to pt reporting feelings of hunger. Note pt's weight has been very stable since admission -- weight on 5/27 was 76.3 kg and 76.6kg on 05/31/21. Given little shifts in weight status, it is likely that pt's tube feeding is providing the correct amount of calories/protein, but RD will  transition to fiber containing formula as requested by MD in hopes of increasing satiety. RD would recommend considering discontinuing Megace while pt is NPO as it may be contributing to pt's feelings of hunger.  PMT has been consulted for discussions regarding PEG placement and possible hysterectomy.   UOP: 1.5L and 1x unmeasured occurrence x24 hours  Medications: Scheduled Meds:  amiodarone  200 mg Per Tube Daily   amLODipine  10 mg Per Tube Daily   aspirin  81 mg Per Tube Daily   atorvastatin  40 mg Per Tube Daily   carvedilol  25 mg Per Tube BID WC   chlorhexidine gluconate (MEDLINE KIT)  15 mL Mouth Rinse BID   Chlorhexidine Gluconate Cloth  6 each Topical Daily   cloNIDine  0.1 mg Per Tube TID   feeding supplement (PROSource TF)  45 mL Per Tube BID   ferrous sulfate  300 mg Per Tube BID WC   free water  200 mL Per Tube Q6H   hydrALAZINE  100 mg Per Tube Q8H   isosorbide dinitrate  40 mg Per Tube TID   mouth rinse  15 mL Mouth Rinse 10 times per day   megestrol  80 mg Per Tube TID   multivitamin with minerals  1 tablet Per Tube Daily   pantoprazole sodium  40 mg Per Tube Daily   sacubitril-valsartan  1 tablet Per Tube BID   sodium chloride flush  10-40 mL Intracatheter Q12H   Continuous Infusions:  feeding supplement (OSMOLITE 1.5  CAL) 1,000 mL (06/05/21 1406)   Labs: Recent Labs  Lab 06/02/21 0306 06/03/21 1613 06/04/21 0457  NA 137 136 136  K 4.6 5.2* 4.5  CL 108 108 105  CO2 19* 19* 22  BUN 20 20 21*  CREATININE 1.14* 1.09* 1.10*  CALCIUM 9.3 9.6 9.3  GLUCOSE 125* 81 127*  CBGs 110-117-122  Diet Order:   Diet Order             Diet NPO time specified  Diet effective midnight                   EDUCATION NEEDS:   No education needs have been identified at this time  Skin:  Skin Assessment: Reviewed RN Assessment  Last BM:  6/14  Height:   Ht Readings from Last 1 Encounters:  05/29/21 '5\' 5"'  (1.651 m)    Weight:   Wt Readings from Last 1  Encounters:  05/31/21 76.6 kg   BMI:  Body mass index is 28.1 kg/m.  Estimated Nutritional Needs:   Kcal:  1850-2100  Protein:  90-105 grams  Fluid:  >1.8 L/day    Larkin Ina, MS, RD, LDN RD pager number and weekend/on-call pager number located in [place].

## 2021-06-05 NOTE — Progress Notes (Signed)
Gynecology Consult Progress  Note  Admission Date: 05/15/2021 Current Date: 6/96/2952 8:41 PM  Holly Bradley is a 45 y.o. G0 with PMHx significant for significant for HTN, known paroxysmal SVT, poor patient compliance, known fibroid uterus, heart failure, tobacco abuse.  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  I was called by her primary team due to patient with VB and passage of two clots per RN.   History complicated by: Patient Active Problem List   Diagnosis Date Noted   Status post tracheostomy ([place])    Vaginal bleeding    Abdominal distention    Encounter for feeding tube placement    Encounter for intubation    Ileus ([place])    Staphylococcus epidermidis bacteremia    Fibroids 05/25/2021   Iron deficiency anemia due to chronic blood loss 05/25/2021   Cerebral edema ([place]) 05/25/2021   Chronic HFrEF (heart failure with reduced ejection fraction) ([place]) 05/25/2021   Pneumonia due to methicillin susceptible Staphylococcus aureus ([place]) 05/25/2021   Acute respiratory failure with hypoxia ([place])    Cerebrovascular accident (CVA) ([place]) 05/15/2021    ROS and patient/family/surgical history, located on admission H&P note dated 05/15/2021, have been reviewed, and there are no changes except as noted below  Subjective:  RN just changed her purewick an hour ago. Prior one not available.   Objective:    Current Vital Signs 24h Vital Sign Ranges  T 98.9 F (37.2 C) Temp  Avg: 98.6 F (37 C)  Min: 98 F (36.7 C)  Max: 99 F (37.2 C)  BP 130/70 BP  Min: 114/62  Max: 158/73  HR 86 Pulse  Avg: 92.5  Min: 84  Max: 98  RR 20 Resp  Avg: 19.3  Min: 16  Max: 23  SaO2 100 % Tracheostomy Collar SpO2  Avg: 90 %  Min: 0 %  Max: 100 %       24 Hour I/O Current Shift I/O  Time Ins Outs 06/13 0701 - 06/14 0700 In: -  Out: 1500 [Urine:1500] 06/14 0701 - 06/14 1900 In: -  Out: 300 [Urine:300]    Patient Vitals for the past 24 hrs:  BP Temp Temp src Pulse  Resp SpO2  06/05/21 1410 130/70 -- -- -- -- --  06/05/21 1135 114/62 98.9 F (37.2 C) Oral 86 20 100 %  06/05/21 1122 -- -- -- 84 16 98 %  06/05/21 0817 -- -- -- 92 20 100 %  06/05/21 0724 (!) 158/73 98.5 F (36.9 C) Oral 94 20 100 %  06/05/21 0355 -- -- -- 96 (!) 21 98 %  06/05/21 0334 125/62 98.4 F (36.9 C) Oral 96 17 99 %  06/05/21 0012 -- -- -- 98 18 (!) 0 %  06/04/21 2327 135/76 98 F (36.7 C) Oral 95 19 99 %  06/04/21 2050 -- -- -- 94 (!) 23 100 %  06/04/21 2017 122/66 99 F (37.2 C) Oral 87 20 99 %  06/04/21 1729 127/64 98.6 F (37 C) Oral 95 18 97 %  06/04/21 1616 (!) 114/55 -- -- -- -- --    Physical exam: General appearance: NAD. Pt able to nod when asked questions GU: No gross VB. Current purewick with minimal amount of old blood, non saturating the fabric.    Medications Current Facility-Administered Medications  Medication Dose Route Frequency Provider Last Rate Last Admin   acetaminophen (TYLENOL) tablet 650 mg  650 mg Per Tube Q4H PRN Llana Aliment, [facility]  Or   acetaminophen (TYLENOL) 160 MG/5ML solution 650 mg  650 mg Per Tube Q4H PRN Llana Aliment, RPH   650 mg at 06/04/21 1313   Or   acetaminophen (TYLENOL) suppository 650 mg  650 mg Rectal Q4H PRN Llana Aliment, RPH       amiodarone (PACERONE) tablet 200 mg  200 mg Per Tube Daily Greta Doom, MD   200 mg at 06/05/21 0839   amLODipine (NORVASC) tablet 10 mg  10 mg Per Tube Daily Debbe Odea, MD   10 mg at 06/05/21 3779   aspirin chewable tablet 81 mg  81 mg Per Tube Daily Mick Sell, PA-C   81 mg at 06/05/21 0839   atorvastatin (LIPITOR) tablet 40 mg  40 mg Per Tube Daily Oda Kilts, Mahmoud, MD   40 mg at 06/05/21 0838   carvedilol (COREG) tablet 25 mg  25 mg Per Tube BID WC Jacky Kindle, MD   25 mg at 06/05/21 0839   chlorhexidine gluconate (MEDLINE KIT) (PERIDEX) 0.12 % solution 15 mL  15 mL Mouth Rinse BID Frederik Pear, MD   15 mL at 06/05/21 0845   Chlorhexidine Gluconate  Cloth 2 % PADS 6 each  6 each Topical Daily Greta Doom, MD   6 each at 06/05/21 0840   cloNIDine (CATAPRES) tablet 0.1 mg  0.1 mg Per Tube TID Debbe Odea, MD   0.1 mg at 06/05/21 0837   feeding supplement (OSMOLITE 1.5 CAL) liquid 1,000 mL  1,000 mL Per Tube Continuous Rosalin Hawking, MD 50 mL/hr at 06/05/21 1406 1,000 mL at 06/05/21 1406   feeding supplement (PROSource TF) liquid 45 mL  45 mL Per Tube BID Garvin Fila, MD   45 mL at 06/05/21 0836   ferrous sulfate 300 (60 Fe) MG/5ML syrup 300 mg  300 mg Per Tube BID WC Debbe Odea, MD   300 mg at 06/05/21 0836   free water 200 mL  200 mL Per Tube Q6H Chand, Currie Paris, MD   200 mL at 06/05/21 1207   hydrALAZINE (APRESOLINE) tablet 100 mg  100 mg Per Tube Lina Sar, MD   100 mg at 06/05/21 1410   isosorbide dinitrate (ISORDIL) tablet 40 mg  40 mg Per Tube TID Jacky Kindle, MD   40 mg at 06/05/21 0840   loperamide HCl (IMODIUM) 1 MG/7.5ML suspension 2 mg  2 mg Per Tube PRN Cristal Generous, NP   2 mg at 05/31/21 1800   MEDLINE mouth rinse  15 mL Mouth Rinse 10 times per day Frederik Pear, MD   15 mL at 06/05/21 1406   megestrol (MEGACE) tablet 80 mg  80 mg Per Tube TID Llana Aliment, RPH   80 mg at 06/05/21 0837   multivitamin with minerals tablet 1 tablet  1 tablet Per Tube Daily Audria Nine, DO   1 tablet at 06/05/21 0839   pantoprazole sodium (PROTONIX) 40 mg/20 mL oral suspension 40 mg  40 mg Per Tube Daily Greta Doom, MD   40 mg at 06/05/21 0836   sacubitril-valsartan (ENTRESTO) 97-103 mg per tablet  1 tablet Per Tube BID Kipp Brood, MD   1 tablet at 06/05/21 0836   simethicone (MYLICON) 40 ZP/6.8GA suspension 40 mg  40 mg Per Tube Q6H PRN Bowser, Laurel Dimmer, NP       sodium chloride flush (NS) 0.9 % injection 10-40 mL  10-40 mL Intracatheter Q12H Debbe Odea, MD  10 mL at 06/05/21 0841   sodium chloride flush (NS) 0.9 % injection 10-40 mL  10-40 mL Intracatheter PRN Debbe Odea, MD           Labs  Recent Labs  Lab 06/02/21 0306 06/03/21 0903 06/04/21 0457  WBC 11.5* 11.5* 8.9  HGB 7.9* 8.6* 7.6*  HCT 27.0* 29.0* 26.2*  PLT 638* 693* 633*    Recent Labs  Lab 06/02/21 0306 06/03/21 1613 06/04/21 0457  NA 137 136 136  K 4.6 5.2* 4.5  CL 108 108 105  CO2 19* 19* 22  BUN 20 20 21*  CREATININE 1.14* 1.09* 1.10*  CALCIUM 9.3 9.6 9.3  PROT  --  6.4* 6.1*  BILITOT  --  0.2* 0.4  ALKPHOS  --  163* 144*  ALT  --  14 13  AST  --  38 26  GLUCOSE 125* 81 127*    Radiology No new relevant imaging  Assessment & Plan:  Pt stable Please see GYN notes from earlier in the hospitalization as well as earlier this year. I told her that it's reassuring that her H/H have remained stable and at her baseline; she has one pending for today. She has been on tid Megace since initially consulted by GYN on 5/30 and 6/1. At this time, I recommend follow up on the CBC, repeating one in the morning and I asked the nurse to save the purewicks and I'll come by and take a look at them tomorrow to get a better sense of the the bleeding. Per the primary team, they state that she's never stopped bleeding.  I told her that her situation is a difficult one b/c even if she had no medical problems dealing with her fibroids would be difficult. She did see IR in 2019 and she was getting set up to get a uterine artery embolization but she never followed through with this when she was up in [place].  I told her that if gets to the point that medical management is not working, then I would recommend having IR come by and see her and see if that's still an option for her, but even if the Megace does not seem to be working, there are other medical options that can be tried before talking to IR.  I did tell her that I do not recommend a hysterectomy and I do not recommend any surgery to remove her fibroids due to current medical condition.   Code Status: Partial Code  Total time taking care of the  patient was 15 minutes, with greater than 50% of the time spent in face to face interaction with the patient.  Durene Romans MD [facility] GYN Consult Phone: [phone] (M-F, 0800-1700) & [phone] (Off hours, weekends, holidays)

## 2021-06-05 NOTE — Progress Notes (Signed)
Occupational Therapy Treatment Patient Details Name: Holly Bradley MRN: [id number] DOB: 04-01-1976 Today's Date: 06/05/2021    History of present illness 45 yo female s/p mechanical thrombectomy on 5/24 of right MCA M1/M2/M3 and embolization of right MCA M3 branch due to contrast extravasation. Pt re-intubated 5/26. Repeat head CT on 5/26 showed increased cytotoxic edema and mildly increased leftward midline shift, now 5 mm, decreased SAH and IVH. Abdominal CT showing 22.9 cm mass, possibly consistent with uterine fibroids, no lymphadenopathy. s/p trach 6/3. PMH fibriods HTN HF paranoid psychosis recent hospitalizations january and february 2022   OT comments  Pt continues to make small progress towards OT goals this session. Pt found with BM, max A +2 for rolling and total A for peri care at bed level, barrier cream applied after being clean and dry. Pt then performed exercises for BUE (please see below) and enjoyed listening to 90's music. Pt non-verbal throughout session but would smile, grimace (during peri care wiping) and use RUE to communicate. Also OT giving multimodal cues for visual attention to the L side of the body with 25% success.    Follow Up Recommendations  SNF    Equipment Recommendations  Wheelchair (measurements OT);Wheelchair cushion (measurements OT);Hospital bed;3 in 1 bedside commode    Recommendations for Other Services Speech consult    Precautions / Restrictions Precautions Precautions: Fall Precaution Comments: L hemiparesis, PRAFO, hand splint,  trach, SBP < 160, cortrak Restrictions Weight Bearing Restrictions: No       Mobility Bed Mobility Overal bed mobility: Needs Assistance Bed Mobility: Rolling Rolling: Max assist;+2 for physical assistance;+2 for safety/equipment         General bed mobility comments: Pt following commands with time for processing to assist with R side (bed knee up, grab on here)    Transfers                  General transfer comment: NT this session    Balance                                           ADL either performed or assessed with clinical judgement   ADL Overall ADL's : Needs assistance/impaired Eating/Feeding: NPO   Grooming: Wash/dry face;Oral care;Sitting;Total assistance           Upper Body Dressing : Maximal assistance Upper Body Dressing Details (indicate cue type and reason): Pt reaching RUE forward into gown sleeve within visual field. Simple direct cues to sequence such as "lift your right hand. reach forward." Pt fatigues quickly Lower Body Dressing: Total assistance       Toileting- Clothing Manipulation and Hygiene: Total assistance;+2 for physical assistance;Bed level Toileting - Clothing Manipulation Details (indicate cue type and reason): Total A +2 for peri care and rolling             Vision       Perception     Praxis      Cognition Arousal/Alertness: Awake/alert Behavior During Therapy: Flat affect Overall Cognitive Status: Difficult to assess Area of Impairment: Following commands;Problem solving                       Following Commands: Follows one step commands with increased time     Problem Solving: Slow processing;Decreased initiation;Difficulty sequencing;Requires verbal cues;Requires tactile cues General Comments: Pt following 1 step commands with rt extremities.  Nodding yes/no appropriately. Lt neglect.        Exercises General Exercises - Upper Extremity Shoulder Flexion: AROM;Right;10 reps;Supine Elbow Flexion: PROM;Both;Supine;15 reps Elbow Extension: PROM;Both;Supine;15 reps Wrist Flexion: PROM;Supine;15 reps;Left Wrist Extension: PROM;Left;15 reps;Supine Digit Composite Flexion: PROM;Both;Supine;15 reps Composite Extension: PROM;Left;15 reps;Supine   Shoulder Instructions       General Comments      Pertinent Vitals/ Pain       Pain Assessment: Faces Faces Pain Scale: No  hurt Pain Intervention(s): Monitored during session;Repositioned  Home Living                                          Prior Functioning/Environment              Frequency  Min 2X/week        Progress Toward Goals  OT Goals(current goals can now be found in the care plan section)  Progress towards OT goals: Progressing toward goals  Acute Rehab OT Goals Patient Stated Goal: none stated OT Goal Formulation: Patient unable to participate in goal setting Time For Goal Achievement: 06/12/21 Potential to Achieve Goals: Good  Plan Discharge plan remains appropriate    Co-evaluation                 AM-PAC OT "6 Clicks" Daily Activity     Outcome Measure   Help from another person eating meals?: Total Help from another person taking care of personal grooming?: A Lot Help from another person toileting, which includes using toliet, bedpan, or urinal?: Total Help from another person bathing (including washing, rinsing, drying)?: Total Help from another person to put on and taking off regular upper body clothing?: A Lot Help from another person to put on and taking off regular lower body clothing?: Total 6 Click Score: 8    End of Session Equipment Utilized During Treatment: Oxygen (trach)  OT Visit Diagnosis: Unsteadiness on feet (R26.81);Muscle weakness (generalized) (M62.81);Hemiplegia and hemiparesis Hemiplegia - Right/Left: Left Hemiplegia - dominant/non-dominant: Non-Dominant Hemiplegia - caused by: Cerebral infarction   Activity Tolerance Patient tolerated treatment well   Patient Left in bed;with call bell/phone within reach;with bed alarm set   Nurse Communication Precautions;Mobility status;Need for lift equipment        Time: 1232-1303 OT Time Calculation (min): 31 min  Charges: OT General Charges $OT Visit: 1 Visit OT Treatments $Self Care/Home Management : 8-22 mins $Therapeutic Exercise: 8-22 mins  Jesse Sans OTR/L Acute  Rehabilitation Services Pager: [phone] Office: [place] 06/05/2021, 3:00 PM

## 2021-06-05 NOTE — Progress Notes (Signed)
[facility] Progress Note  21 Days Post-Op  Subjective: CC:  Alert, NAD. nodding head yes/no to my questions. I told the patient about my discussion with her brother on the phone yesterday (documented in my A&P)   Objective: Vital signs in last 24 hours: Temp:  [98 F (36.7 C)-100 F (37.8 C)] 98.5 F (36.9 C) (06/14 0724) Pulse Rate:  [87-98] 94 (06/14 0724) Resp:  [17-23] 20 (06/14 0724) BP: (114-158)/(55-76) 158/73 (06/14 0724) SpO2:  [0 %-100 %] 100 % (06/14 0724) FiO2 (%):  [21 %] 21 % (06/14 0355) Last BM Date: 06/03/21  Intake/Output from previous day: 06/13 0701 - 06/14 0700 In: -  Out: 1000 [Urine:1000] Intake/Output this shift: No intake/output data recorded.  PE: Gen:  Alert, NAD, cooperative and appropriate Head: cortrak in R nare w/ TF running at 50 cc/hr Neck: trach in place on TC Card:  Regular rate and rhythm, pedal pulses 2+ BL Pulm:  Normal effort, rales left lung base Abd: Soft, non-tender, non-distended, large, palpable mass from pelvis up past umbilicus. Skin: warm and dry, no rashes  Psych: A&Ox3   Lab Results:  Recent Labs    06/03/21 0903 06/04/21 0457  WBC 11.5* 8.9  HGB 8.6* 7.6*  HCT 29.0* 26.2*  PLT 693* 633*   BMET Recent Labs    06/03/21 1613 06/04/21 0457  NA 136 136  K 5.2* 4.5  CL 108 105  CO2 19* 22  GLUCOSE 81 127*  BUN 20 21*  CREATININE 1.09* 1.10*  CALCIUM 9.6 9.3   PT/INR No results for input(s): LABPROT, INR in the last 72 hours. CMP     Component Value Date/Time   NA 136 06/04/2021 0457   K 4.5 06/04/2021 0457   CL 105 06/04/2021 0457   CO2 22 06/04/2021 0457   GLUCOSE 127 (H) 06/04/2021 0457   BUN 21 (H) 06/04/2021 0457   CREATININE 1.10 (H) 06/04/2021 0457   CALCIUM 9.3 06/04/2021 0457   PROT 6.1 (L) 06/04/2021 0457   ALBUMIN 2.0 (L) 06/04/2021 0457   AST 26 06/04/2021 0457   ALT 13 06/04/2021 0457   ALKPHOS 144 (H) 06/04/2021 0457   BILITOT 0.4 06/04/2021 0457   GFRNONAA >60  06/04/2021 0457   Lipase  No results found for: LIPASE     Studies/Results: CT Angio Chest Pulmonary Embolism (PE) W or WO Contrast  Result Date: 06/03/2021 CLINICAL DATA:  Shortness of breath EXAM: CT ANGIOGRAPHY CHEST WITH CONTRAST TECHNIQUE: Multidetector CT imaging of the chest was performed using the standard protocol during bolus administration of intravenous contrast. Multiplanar CT image reconstructions and MIPs were obtained to evaluate the vascular anatomy. CONTRAST:  31mL OMNIPAQUE IOHEXOL 350 MG/ML SOLN COMPARISON:  January 11, 2021. FINDINGS: Cardiovascular: Satisfactory opacification of the pulmonary arteries to the segmental level. No evidence of pulmonary embolism. Cardiomegaly. Small pericardial effusion. Mediastinum/Nodes: Scattered thyroid nodules, the largest of which measures 12 mm. No mediastinal hilar adenopathy. No axillary adenopathy. Lungs/Pleura: Tracheostomy. LEFT basilar atelectasis. No pleural effusion or pneumothorax. Upper Abdomen: Enteric tube tip terminates in the proximal duodenum. Musculoskeletal: No chest wall abnormality. No acute or significant osseous findings. Review of the MIP images confirms the above findings. IMPRESSION: 1. No evidence of pulmonary embolism. 2. Cardiomegaly. 3. Scattered thyroid nodules. Not clinically significant; no follow-up imaging recommended (ref: J Am Coll Radiol. 2015 Feb;12(2): 143-50). Electronically Signed   By: Valentino Saxon MD   On: 06/03/2021 17:39    Anti-infectives: Anti-infectives (From admission, onward)  Start     Dose/Rate Route Frequency Ordered Stop   05/30/21 1515  ceFAZolin (ANCEF) IVPB 2g/100 mL premix  Status:  Discontinued        2 g 200 mL/hr over 30 Minutes Intravenous Every 8 hours 05/30/21 1415 05/31/21 1404   05/21/21 1100  ceFAZolin (ANCEF) IVPB 2g/100 mL premix        2 g 200 mL/hr over 30 Minutes Intravenous Every 8 hours 05/21/21 1030 05/28/21 0537        Assessment/Plan   Dysphagia Consult for PEG placement - tolerating TF via cortrak at 50 cc/hr and having BMs. - patient also with 22.9 x 22.9 x 13.8 cm uterine fibroid and abdomen firm on palpation - patient seems to be clinically improving from MSSA PNA and possible bacteremia, now off of abx and afebrile for about 48 hours. - failed swallow eval earlier in admission, s/p trach now but still has cuffed trach - consider re-eval once changed to cuffless trach; per CCM note 6/13 planning for possible transition to cuffless trach after surgical G tube, just incase pt needs assisted ventilation for procedure   - I spoke to the patients brother on the phone yesterday, 6/13, regarding surgical G tube and he was not ready to consent to the procedure. He did say the patient would not want a permanent feeding tube. If the patient does progress from a respiratory and speech standpoint then the feeding tube could be removed in as little as 6-8 weeks after surgery if it was no longer needed. The brother's biggest concern during our conversation was re-visiting the idea of surgery to remove the patients chronically bleeding uterine fibroids, which he states have caused her a lot of trouble, at least 20 blood transfusions, and greatly contribute to her clinical depression.   - below per primary attending - R MCA CVA complicated by [facility] with midline shift and cerebral edema Acute on chronic anemia, multifactorial AKI S/P tracheostomy MSSA PNA - completed course of ancef x 7 days ?staph epidermis bacteremia - blood cx 5/30 positive for staph epidermis and staph hominis, per ID monitor off of abx for now, staph epi should have been covered by 7d ancef.  Leukocytosis - resolved  T2DM controlled with diet/exercise Uncontrolled HTN HLD CHF with pEF SVT Uterine fibroids Hx of paranoid psychosis    LOS: 21 days    Obie Dredge, [facility] Please see Amion for pager number during day hours  7:00am-4:30pm

## 2021-06-05 NOTE — Progress Notes (Signed)
  Speech Language Pathology Treatment: Dysphagia;Passy Muir Speaking valve (see PMSV note)  Patient Details Name: Holly Bradley MRN: [id number] DOB: Jun 18, 1976 Today's Date: 06/05/2021 Time: 4628-6381 SLP Time Calculation (min) (ACUTE ONLY): 15 min  Assessment / Plan / Recommendation Clinical Impression  Pt initially lethargic, arousing with some cueing. At baseline head was leaning to the left with significant oral drooling. Repositioned pt with head neutral, no further drooling noted during interaction with SLP.  Attempted PMSV prior to dysphagia tx (see PMSV note this date) pt unable to tolerate. Performed diligent oral care. Trialed single ice chip. Pt with some oral holding in anterior portion of oral cavity. Pt was able to propel bolus to pharynx with palpable swallow however followed by immediate coughing, concerning for reduced airway protection. No further PO trials were given this date. Will continue to closely follow. Per MD report, pt will require cuffed trach if proceeding with PEG placement, if not moving forward with PEG, pt able to go to cuffless trach. Suspect once pt able to tolerate PMSV, PMSV use will aid swallow function. SLP to continue to follow.    HPI HPI: Pt is a [name] who presents with AMS and slurred speech after being found down. MRI showing moderately large acute right MCA infarct with associated petechial hemorrhage, SAH, and minimal IVH. S/p mechanical thrombectomy of M1/M2/M3 and embolization of right MCA M3 branch. Significant PMH: uncontrolled HTN, tobacco use disorder, HF, paroxysmal SVT, paranoid psychosis. Required intubation s/p trach 6/3. Pt pending PEG however large uterine mass disrupting planned placement.      SLP Plan  Continue with current plan of care  Patient needs continued Speech Lanaguage Pathology Services    Recommendations  Diet recommendations: NPO Medication Administration: Via alternative means      Patient may use Passy-Muir  Speech Valve: with SLP only PMSV Supervision: Full MD: Please consider changing trach tube to : Cuffless;Smaller size         Oral Care Recommendations: Oral care QID Follow up Recommendations: Skilled Nursing facility SLP Visit Diagnosis: Dysphagia, unspecified (R13.10) Plan: Continue with current plan of care       GO                Hayden Rasmussen MA, CCC-SLP Acute Rehabilitation Services   06/05/2021, 12:04 PM

## 2021-06-05 NOTE — Progress Notes (Signed)
[facility] Speaking [facility] - Treatment Patient Details  Name: Holly Bradley MRN: [id number] Date of Birth: Dec 24, 1975  Today's Date: 06/05/2021 Time: 9379-0240 SLP Time Calculation (min) (ACUTE ONLY): 12 min  Past Medical History:  Past Medical History:  Diagnosis Date   Hypertension    Past Surgical History:  Past Surgical History:  Procedure Laterality Date   IR ANGIOGRAM FOLLOW UP STUDY  05/15/2021   IR CT HEAD LTD  05/15/2021   IR PERCUTANEOUS ART THROMBECTOMY/INFUSION INTRACRANIAL INC DIAG ANGIO  05/15/2021       IR PERCUTANEOUS ART THROMBECTOMY/INFUSION INTRACRANIAL INC DIAG ANGIO  05/15/2021   IR TRANSCATH/EMBOLIZ  05/15/2021   RADIOLOGY WITH ANESTHESIA N/A 05/15/2021   Procedure: IR WITH ANESTHESIA;  Surgeon: Radiologist, Medication, MD;  Location: [facility];  Service: Radiology;  Laterality: N/A;   HPI:  Pt is a [name] who presents with AMS and slurred speech after being found down. MRI showing moderately large acute right MCA infarct with associated petechial hemorrhage, SAH, and minimal IVH. S/p mechanical thrombectomy of M1/M2/M3 and embolization of right MCA M3 branch. Significant PMH: uncontrolled HTN, tobacco use disorder, HF, paroxysmal SVT, paranoid psychosis. Required intubation s/p trach 6/3. Pt pending PEG however large uterine mass disrupting planned placement.   Assessment / Plan / Recommendation Clinical Impression  Pt unable to redirect airflow to supraglottic region for phonation. Pt with 6 cuffed shiley, cuff deflated at baseline on trach collar. Pt exhibited discomfort with continued attempt of PMSV placement (less than 10 seconds) c/b increased work of breathing, increased RR, and facial grimacing. Upon removal significant air trapping also noted concerning for poor airway patency. If clinically appropriate, per MD discretion, consider trach downsize and cuffless trach with hopes for PMSV tolerance and improved swallow function. Pt with waxing and waning in ability  to follow simple commands depsite cueing this date. She continues to exhibit left neglect. SLP to continue to closely follow.  SLP Visit Diagnosis: Aphonia (R49.1)    SLP Assessment  Patient needs continued Speech Lanaguage Pathology Services    Follow Up Recommendations  Skilled Nursing facility    Frequency and Duration min 2x/week  2 weeks    PMSV Trial PMSV was placed for: 10 second interval Able to redirect subglottic air through upper airway: No Able to Attain Phonation: No Voice Quality: Aphonic Able to Expectorate Secretions: Yes Level of Secretion Expectoration with PMSV: Tracheal Breath Support for Phonation: Inadequate Intelligibility: Unable to assess (comment) Respirations During Trial: 20 SpO2 During Trial: 100 % Pulse During Trial: 85 Behavior: Poor eye contact;No attempt to communicate   Tracheostomy Tube  Additional Tracheostomy Tube Assessment Fenestrated: No Trach Collar Period: wake hours Secretion Description: frothy Frequency of Tracheal Suctioning: mild Level of Secretion Expectoration: Tracheal    Vent Dependency  Vent Dependent: No FiO2 (%): 21 %    Cuff Deflation Trial  GO Tolerated Cuff Deflation: Yes Length of Time for Cuff Deflation Trial: deflated at baseline Behavior: Poor eye contact        Hayden Rasmussen MA, CCC-SLP Acute Rehabilitation Services   06/05/2021, 11:43 AM

## 2021-06-05 NOTE — Progress Notes (Signed)
Patient ID: Holly Bradley, female   DOB: 08/26/1976, 45 y.o.   MRN: [id number] I spoke with her brother at the bedside and discussed the potential of gastrostomy placement. I answered his questions about this and he is unsure if he wants her to have it. He is very concerned (understandably) about her fibroids and uterine bleeding and she was seen by OB/GYN today. He has a lot of questions about her disposition and potential for neurologic recovery and I deferred to those services. We will follow.  Georganna Skeans, MD, MPH, FACS Please use [email or web address] to contact on call provider We will continue to follow.

## 2021-06-05 NOTE — Progress Notes (Signed)
[facility] for [facility]   Reason for visit: follow up on fever  Interval History: she remains afebrile, no acute events and no new concerns expressed.   Physical Exam: Constitutional:  Vitals:   06/05/21 1122 06/05/21 1135  BP:  114/62  Pulse: 84 86  Resp: 16 20  Temp:  98.9 F (37.2 C)  SpO2: 98% 100%  She is in nad Respiratory: CTA B, anterior exam Cardiovascular: RRR   Review of Systems: Constitutional: negative for chills or fever  Lab Results  Component Value Date   WBC 8.9 06/04/2021   HGB 7.6 (L) 06/04/2021   HCT 26.2 (L) 06/04/2021   MCV 79.6 (L) 06/04/2021   PLT 633 (H) 06/04/2021    Lab Results  Component Value Date   CREATININE 1.10 (H) 06/04/2021   BUN 21 (H) 06/04/2021   NA 136 06/04/2021   K 4.5 06/04/2021   CL 105 06/04/2021   CO2 22 06/04/2021    Lab Results  Component Value Date   ALT 13 06/04/2021   AST 26 06/04/2021   ALKPHOS 144 (H) 06/04/2021     Microbiology: Recent Results (from the past 240 hour(s))  Culture, Respiratory w Gram Stain     Status: None   Collection Time: 05/28/21  7:43 AM   Specimen: Tracheal Aspirate; Respiratory  Result Value Ref Range Status   Specimen Description TRACHEAL ASPIRATE  Final   Special Requests NONE  Final   Gram Stain   Final    FEW WBC PRESENT,BOTH PMN AND MONONUCLEAR RARE GRAM POSITIVE COCCI IN PAIRS    Culture   Final    Normal respiratory flora-no Staph aureus or Pseudomonas seen Performed at [facility], [address], [place], [place] [address]    Report Status 05/30/2021 FINAL  Final  Culture, blood (routine x 2)     Status: Abnormal   Collection Time: 05/28/21  8:27 AM   Specimen: BLOOD  Result Value Ref Range Status   Specimen Description BLOOD RIGHT ANTECUBITAL  Final   Special Requests   Final    BOTTLES DRAWN AEROBIC AND ANAEROBIC Blood Culture adequate volume   Culture  Setup Time   Final    GRAM POSITIVE COCCI IN CLUSTERS AEROBIC BOTTLE  ONLY CRITICAL VALUE NOTED.  VALUE IS CONSISTENT WITH PREVIOUSLY REPORTED AND CALLED VALUE. Performed at [facility], [place] [address], [place], [place] [address]    Culture STAPHYLOCOCCUS EPIDERMIDIS (A)  Final   Report Status 05/31/2021 FINAL  Final   Organism ID, Bacteria STAPHYLOCOCCUS EPIDERMIDIS  Final      Susceptibility   Staphylococcus epidermidis - MIC*    CIPROFLOXACIN >=8 RESISTANT Resistant     ERYTHROMYCIN >=8 RESISTANT Resistant     GENTAMICIN <=0.5 SENSITIVE Sensitive     OXACILLIN >=4 RESISTANT Resistant     TETRACYCLINE 2 SENSITIVE Sensitive     VANCOMYCIN <=0.5 SENSITIVE Sensitive     TRIMETH/SULFA 80 RESISTANT Resistant     CLINDAMYCIN >=8 RESISTANT Resistant     RIFAMPIN <=0.5 SENSITIVE Sensitive     Inducible Clindamycin NEGATIVE Sensitive     * STAPHYLOCOCCUS EPIDERMIDIS  Culture, blood (routine x 2)     Status: Abnormal   Collection Time: 05/28/21  8:28 AM   Specimen: BLOOD  Result Value Ref Range Status   Specimen Description BLOOD RIGHT ANTECUBITAL  Final   Special Requests   Final    BOTTLES DRAWN AEROBIC AND ANAEROBIC Blood Culture adequate volume  Culture  Setup Time   Final    GRAM POSITIVE COCCI IN CLUSTERS AEROBIC BOTTLE ONLY CRITICAL VALUE NOTED.  VALUE IS CONSISTENT WITH PREVIOUSLY REPORTED AND CALLED VALUE. Performed at [facility], [place] [address] [place], [place] [address]    Culture STAPHYLOCOCCUS EPIDERMIDIS (A)  Final   Report Status 06/02/2021 FINAL  Final   Organism ID, Bacteria STAPHYLOCOCCUS EPIDERMIDIS  Final      Susceptibility   Staphylococcus epidermidis - MIC*    CIPROFLOXACIN >=8 RESISTANT Resistant     ERYTHROMYCIN <=0.25 SENSITIVE Sensitive     GENTAMICIN <=0.5 SENSITIVE Sensitive     OXACILLIN >=4 RESISTANT Resistant     TETRACYCLINE 2 SENSITIVE Sensitive     VANCOMYCIN <=0.5 SENSITIVE Sensitive     TRIMETH/SULFA 80 RESISTANT Resistant     CLINDAMYCIN <=0.25 SENSITIVE Sensitive     RIFAMPIN <=0.5 SENSITIVE  Sensitive     Inducible Clindamycin NEGATIVE Sensitive     * STAPHYLOCOCCUS EPIDERMIDIS  Culture, blood (Routine X 2) w Reflex to ID Panel     Status: None (Preliminary result)   Collection Time: 06/02/21  2:17 PM   Specimen: BLOOD LEFT HAND  Result Value Ref Range Status   Specimen Description BLOOD LEFT HAND  Final   Special Requests   Final    BOTTLES DRAWN AEROBIC AND ANAEROBIC Blood Culture adequate volume   Culture   Final    NO GROWTH 3 DAYS Performed at [facility], [address], [place], [place] [address]    Report Status PENDING  Incomplete  Culture, blood (Routine X 2) w Reflex to ID Panel     Status: None (Preliminary result)   Collection Time: 06/02/21  2:22 PM   Specimen: BLOOD  Result Value Ref Range Status   Specimen Description BLOOD LEFT ANTECUBITAL  Final   Special Requests   Final    BOTTLES DRAWN AEROBIC AND ANAEROBIC Blood Culture adequate volume   Culture   Final    NO GROWTH 3 DAYS Performed at [facility], [place] [address], [place], [place] [address]    Report Status PENDING  Incomplete    Impression/Plan:  1. Fever - no further fever for > 72 hours.  No positive cultures.  No antibiotics indicated.   2.  Staph epi positive blood culture - 2 different species in different sets, also Staph hominis.  Repeat blood cultures remain negative.  No treatment indicated.  Most c/w a contaminate for each.    3.  Dysphagia - considering PEG tube but no plan at this time with brother not ready to consent for the procedure.    I will sign off, call with questions.

## 2021-06-05 NOTE — Progress Notes (Addendum)
PROGRESS NOTE    Jennipher Weatherholtz   PIR:[id number]  DOB: 1976-11-30  DOA: 05/15/2021 PCP: Mitzi Hansen, MD   Brief Narrative:  Holly Bradley is a 45  Y/o with HTN, DM 2, large uterine fibroids, pSVT, HFpEF, paranoid psychosis who presented on 5/24 with confusion.  She was found to have a right MCA M1 occlusion and underwent a thrombectomy which was complicated by contrast extravasation and M3 coil embolization and revascularization. She developed a subarachnoid hemorrhage. Repeat imaging reveal a right basal ganglia infarct with a 3 mm right to left shift. She was started on hypertonic saline. On 5/26 she developed respiratory distress and was intubated. On 5/26 a head CT showed increased cytotoxic edema and a left sided shift with was 5 mm. She has not had improvement in mental status and now has a trach. She continues to require tube feeds. She has been treated for MSSA pneumonia with Ancef (course complted on 6/7)  Blood cultures on 5/30 noted to be positive for staph Epi. These were repeated and remain positive. Ancef has been resumed.    Subjective: She has no complaints today. Her brother is asking if she can have her uterus /fibroids taken out. He has been hesitant to pursue the feeding tube and is asking if the fibroids cannot be addressed first.    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 complicated by ruptured right M3 causing which was successfully embolized Dysphagia - awake minimally communicative with trach and tube feeds - hypercoagulable w/u as follows: -Anticardiolipin IgM is 17, DRVVT 80.2 (range from moderate is 20-80)- have has stroke team to review findings -  Her brother is now stating that she would not have wanted a permanent feeding tube - general surgery has been following with a plan for open G tube due to extensive fibroids and difficult anatomy - When I have asked about a feeding tube, she  closes her eyes or looks away - cont Lipitor and ASA 81 mg  Active Problems:   Acute respiratory failure with hypoxia  - continue Trach per PCCM- they plan to see Tues and Thurs for trach care  MSSA pneumonia - 1 wk of antibiotics completed on 6/6  Staph epidermidis bacteremia, fever, leucocytosis> sepsis - noted on blood cultures from 5/30 and again on repeat culture from 6/6 -   Ancef resumed 6/8-6/9 -  based on sensitivities, culture are growing 2 different types of staph epi (one is a MRSE) and also staph hominis  - ID recommends holding off on Abx as cultures likely contaminated - last elevated tem temp 101 ton 6/10  - UA negative-  CTA chest negative for infection and PE  - no diarrhea, no rash, does not complain of pain when asked, no pressure ulcers per RN - tachycardia and tachypnea have resolved - lactic acid was normal - ID also following- fevers improving     Fibroids - extremely large fibroids palpable on exam - she had some vaginal bleeding & OB/GYN evaluated her on 3/31 and recommended Megace 80 mg TID to stop the bleeding (on a lower dose a outpt)- also stated that she is currently not a surgical candidate - I spoke with Dr Elly Modena who was on call a few days ago (she was not bleeding then) and she recommended to continue Megace- A fibroidectomy can be revisited in the future - When checking with RN today, I was notified that she is passing  clots and blood since last night- Have asked Gyn to come back to evaluate her - again recommending against surgery - they will follow     Iron deficiency anemia due to chronic blood loss - Hb ~ 7- 8 - resumed oral Iron - following Hb intermittently - rechecked today and found to be 8.5  Thrombocytosis - following- it is rising daily- likley as an acute phase reactant       Chronic systolic and diastolic CHF  Difficult to control HTN - ECHO on 5/25 reveals that EF has improved to normal and she has grade 2 d CHF - cont  Entresto, Imdur, Hydralazine, Clonidine and Coreg  - Demadex on hold  - following fluid status  Nutrition - cont the following per nutrition note>  osmolite 1.5 @ 50 cc /hr Prosource TF 45 ml BID 200 cc freee water Q 6 hrs for a total of 1512 cc/day  H/o PSVT - cont Amiodarone (home med)   Tobacco abuse - prior to admission  Note: Will ask palliative care to return to help sort through with the patient and family regarding feeding tube and a possible hysterectomy.    Time spent in minutes: 35 DVT prophylaxis: SCD's Start: 05/15/21 0753 Code Status: no CPR Family Communication: brother Mattingly [place] Level of Care: Level of care: Med-Surg Disposition Plan:  Status is: Inpatient  Remains inpatient appropriate because:IV treatments appropriate due to intensity of illness or inability to take PO   Dispo: The patient is from: Home              Anticipated d/c is to:  TBD              Patient currently is not medically stable to d/c.   Difficult to place patient Yes   Consultants:  Neurology PCCM Palliative care IR Gyn General surgery Procedures:  thrombectomy Antimicrobials:  Anti-infectives (From admission, onward)    Start     Dose/Rate Route Frequency Ordered Stop   05/30/21 1515  ceFAZolin (ANCEF) IVPB 2g/100 mL premix  Status:  Discontinued        2 g 200 mL/hr over 30 Minutes Intravenous Every 8 hours 05/30/21 1415 05/31/21 1404   05/21/21 1100  ceFAZolin (ANCEF) IVPB 2g/100 mL premix        2 g 200 mL/hr over 30 Minutes Intravenous Every 8 hours 05/21/21 1030 05/28/21 0537        Objective: Vitals:   06/05/21 0724 06/05/21 0817 06/05/21 1122 06/05/21 1135  BP: (!) 158/73   114/62  Pulse: 94 92 84 86  Resp: '20 20 16 20  ' Temp: 98.5 F (36.9 C)   98.9 F (37.2 C)  TempSrc: Oral   Oral  SpO2: 100% 100% 98% 100%  Weight:      Height:        Intake/Output Summary (Last 24 hours) at 06/05/2021 1322 Last data filed at 06/05/2021 0800 Gross per 24  hour  Intake --  Output 800 ml  Net -800 ml    Filed Weights   05/30/21 0322 05/31/21 0355 05/31/21 1528  Weight: 74 kg 80.2 kg 76.6 kg    Examination: General exam: Appears comfortable  HEENT: PERRL- oral mucosa moist, no sclera icterus or thrush Respiratory system: Clear to auscultation. Respiratory effort normal. Cardiovascular system: S1 & S2 heard, regular rate and rhythm Gastrointestinal system: Abdomen soft, non-tender, nondistended. Normal bowel sounds   Central nervous system: Alert -left hemiplegia Extremities: No cyanosis, clubbing or edema Skin: No  rashes or ulcers Psychiatry:  appears very depressed    Data Reviewed: I have personally reviewed following labs and imaging studies  CBC: Recent Labs  Lab 05/31/21 0314 06/01/21 0255 06/01/21 0640 06/02/21 0306 06/03/21 0903 06/04/21 0457  WBC 13.1* 11.4*  --  11.5* 11.5* 8.9  NEUTROABS 11.1*  --   --   --   --   --   HGB 8.0* 7.8* 8.1* 7.9* 8.6* 7.6*  HCT 27.2* 26.9* 28.3* 27.0* 29.0* 26.2*  MCV 76.6* 77.5*  --  78.3* 79.2* 79.6*  PLT 533* 577*  --  638* 693* 633*    Basic Metabolic Panel: Recent Labs  Lab 05/31/21 0314 06/01/21 0255 06/02/21 0306 06/03/21 1613 06/04/21 0457  NA 137 135 137 136 136  K 4.6 4.6 4.6 5.2* 4.5  CL 107 107 108 108 105  CO2 20* 20* 19* 19* 22  GLUCOSE 113* 110* 125* 81 127*  BUN '19 18 20 20 ' 21*  CREATININE 1.12* 1.04* 1.14* 1.09* 1.10*  CALCIUM 9.0 9.1 9.3 9.6 9.3    GFR: Estimated Creatinine Clearance: 64.7 mL/min (A) (by C-G formula based on SCr of 1.1 mg/dL (H)). Liver Function Tests: Recent Labs  Lab 06/03/21 1613 06/04/21 0457  AST 38 26  ALT 14 13  ALKPHOS 163* 144*  BILITOT 0.2* 0.4  PROT 6.4* 6.1*  ALBUMIN 2.2* 2.0*    No results for input(s): LIPASE, AMYLASE in the last 168 hours. No results for input(s): AMMONIA in the last 168 hours. Coagulation Profile: No results for input(s): INR, PROTIME in the last 168 hours. Cardiac Enzymes: No results  for input(s): CKTOTAL, CKMB, CKMBINDEX, TROPONINI in the last 168 hours. BNP (last 3 results) No results for input(s): PROBNP in the last 8760 hours. HbA1C: No results for input(s): HGBA1C in the last 72 hours. CBG: Recent Labs  Lab 06/04/21 2111 06/05/21 0027 06/05/21 0406 06/05/21 0816 06/05/21 1131  GLUCAP 94 95 115* 110* 117*    Lipid Profile: No results for input(s): CHOL, HDL, LDLCALC, TRIG, CHOLHDL, LDLDIRECT in the last 72 hours. Thyroid Function Tests: No results for input(s): TSH, T4TOTAL, FREET4, T3FREE, THYROIDAB in the last 72 hours. Anemia Panel: No results for input(s): VITAMINB12, FOLATE, FERRITIN, TIBC, IRON, RETICCTPCT in the last 72 hours. Urine analysis:    Component Value Date/Time   COLORURINE YELLOW 06/01/2021 2051   APPEARANCEUR HAZY (A) 06/01/2021 2051   LABSPEC 1.012 06/01/2021 2051   PHURINE 7.0 06/01/2021 2051   GLUCOSEU NEGATIVE 06/01/2021 2051   HGBUR MODERATE (A) 06/01/2021 2051   BILIRUBINUR NEGATIVE 06/01/2021 2051   [place] 06/01/2021 2051   PROTEINUR 30 (A) 06/01/2021 2051   NITRITE NEGATIVE 06/01/2021 2051   LEUKOCYTESUR SMALL (A) 06/01/2021 2051   Sepsis Labs: '@LABRCNTIP' (procalcitonin:4,lacticidven:4) ) Recent Results (from the past 240 hour(s))  Culture, Respiratory w Gram Stain     Status: None   Collection Time: 05/28/21  7:43 AM   Specimen: Tracheal Aspirate; Respiratory  Result Value Ref Range Status   Specimen Description TRACHEAL ASPIRATE  Final   Special Requests NONE  Final   Gram Stain   Final    FEW WBC PRESENT,BOTH PMN AND MONONUCLEAR RARE GRAM POSITIVE COCCI IN PAIRS    Culture   Final    Normal respiratory flora-no Staph aureus or Pseudomonas seen Performed at [facility], [address] [place], [place] [address]    Report Status 05/30/2021 FINAL  Final  Culture, blood (routine x 2)     Status:  Abnormal   Collection Time: 05/28/21  8:27 AM   Specimen: BLOOD  Result Value Ref Range Status    Specimen Description BLOOD RIGHT ANTECUBITAL  Final   Special Requests   Final    BOTTLES DRAWN AEROBIC AND ANAEROBIC Blood Culture adequate volume   Culture  Setup Time   Final    GRAM POSITIVE COCCI IN CLUSTERS AEROBIC BOTTLE ONLY CRITICAL VALUE NOTED.  VALUE IS CONSISTENT WITH PREVIOUSLY REPORTED AND CALLED VALUE. Performed at [facility], [place] [address], [place], [place] [address]    Culture STAPHYLOCOCCUS EPIDERMIDIS (A)  Final   Report Status 05/31/2021 FINAL  Final   Organism ID, Bacteria STAPHYLOCOCCUS EPIDERMIDIS  Final      Susceptibility   Staphylococcus epidermidis - MIC*    CIPROFLOXACIN >=8 RESISTANT Resistant     ERYTHROMYCIN >=8 RESISTANT Resistant     GENTAMICIN <=0.5 SENSITIVE Sensitive     OXACILLIN >=4 RESISTANT Resistant     TETRACYCLINE 2 SENSITIVE Sensitive     VANCOMYCIN <=0.5 SENSITIVE Sensitive     TRIMETH/SULFA 80 RESISTANT Resistant     CLINDAMYCIN >=8 RESISTANT Resistant     RIFAMPIN <=0.5 SENSITIVE Sensitive     Inducible Clindamycin NEGATIVE Sensitive     * STAPHYLOCOCCUS EPIDERMIDIS  Culture, blood (routine x 2)     Status: Abnormal   Collection Time: 05/28/21  8:28 AM   Specimen: BLOOD  Result Value Ref Range Status   Specimen Description BLOOD RIGHT ANTECUBITAL  Final   Special Requests   Final    BOTTLES DRAWN AEROBIC AND ANAEROBIC Blood Culture adequate volume   Culture  Setup Time   Final    GRAM POSITIVE COCCI IN CLUSTERS AEROBIC BOTTLE ONLY CRITICAL VALUE NOTED.  VALUE IS CONSISTENT WITH PREVIOUSLY REPORTED AND CALLED VALUE. Performed at [facility], [place] [address], [place], [place] [address]    Culture STAPHYLOCOCCUS EPIDERMIDIS (A)  Final   Report Status 06/02/2021 FINAL  Final   Organism ID, Bacteria STAPHYLOCOCCUS EPIDERMIDIS  Final      Susceptibility   Staphylococcus epidermidis - MIC*    CIPROFLOXACIN >=8 RESISTANT Resistant     ERYTHROMYCIN <=0.25 SENSITIVE Sensitive     GENTAMICIN <=0.5 SENSITIVE Sensitive      OXACILLIN >=4 RESISTANT Resistant     TETRACYCLINE 2 SENSITIVE Sensitive     VANCOMYCIN <=0.5 SENSITIVE Sensitive     TRIMETH/SULFA 80 RESISTANT Resistant     CLINDAMYCIN <=0.25 SENSITIVE Sensitive     RIFAMPIN <=0.5 SENSITIVE Sensitive     Inducible Clindamycin NEGATIVE Sensitive     * STAPHYLOCOCCUS EPIDERMIDIS  Culture, blood (Routine X 2) w Reflex to ID Panel     Status: None (Preliminary result)   Collection Time: 06/02/21  2:17 PM   Specimen: BLOOD LEFT HAND  Result Value Ref Range Status   Specimen Description BLOOD LEFT HAND  Final   Special Requests   Final    BOTTLES DRAWN AEROBIC AND ANAEROBIC Blood Culture adequate volume   Culture   Final    NO GROWTH 3 DAYS Performed at [facility], [address], [place], [place] [address]    Report Status PENDING  Incomplete  Culture, blood (Routine X 2) w Reflex to ID Panel     Status: None (Preliminary result)   Collection Time: 06/02/21  2:22 PM   Specimen: BLOOD  Result Value Ref Range Status   Specimen Description BLOOD LEFT ANTECUBITAL  Final   Special Requests  Final    BOTTLES DRAWN AEROBIC AND ANAEROBIC Blood Culture adequate volume   Culture   Final    NO GROWTH 3 DAYS Performed at [facility], [place] [address] [place], [place] [address]    Report Status PENDING  Incomplete         Radiology Studies: CT Angio Chest Pulmonary Embolism (PE) W or WO Contrast  Result Date: 06/03/2021 CLINICAL DATA:  Shortness of breath EXAM: CT ANGIOGRAPHY CHEST WITH CONTRAST TECHNIQUE: Multidetector CT imaging of the chest was performed using the standard protocol during bolus administration of intravenous contrast. Multiplanar CT image reconstructions and MIPs were obtained to evaluate the vascular anatomy. CONTRAST:  41m OMNIPAQUE IOHEXOL 350 MG/ML SOLN COMPARISON:  January 11, 2021. FINDINGS: Cardiovascular: Satisfactory opacification of the pulmonary arteries to the segmental level. No evidence of pulmonary  embolism. Cardiomegaly. Small pericardial effusion. Mediastinum/Nodes: Scattered thyroid nodules, the largest of which measures 12 mm. No mediastinal hilar adenopathy. No axillary adenopathy. Lungs/Pleura: Tracheostomy. LEFT basilar atelectasis. No pleural effusion or pneumothorax. Upper Abdomen: Enteric tube tip terminates in the proximal duodenum. Musculoskeletal: No chest wall abnormality. No acute or significant osseous findings. Review of the MIP images confirms the above findings. IMPRESSION: 1. No evidence of pulmonary embolism. 2. Cardiomegaly. 3. Scattered thyroid nodules. Not clinically significant; no follow-up imaging recommended (ref: J Am Coll Radiol. 2015 Feb;12(2): 143-50). Electronically Signed   By: SValentino SaxonMD   On: 06/03/2021 17:39       Scheduled Meds:  amiodarone  200 mg Per Tube Daily   amLODipine  10 mg Per Tube Daily   aspirin  81 mg Per Tube Daily   atorvastatin  40 mg Per Tube Daily   carvedilol  25 mg Per Tube BID WC   chlorhexidine gluconate (MEDLINE KIT)  15 mL Mouth Rinse BID   Chlorhexidine Gluconate Cloth  6 each Topical Daily   cloNIDine  0.1 mg Per Tube TID   feeding supplement (PROSource TF)  45 mL Per Tube BID   ferrous sulfate  300 mg Per Tube BID WC   free water  200 mL Per Tube Q6H   hydrALAZINE  100 mg Per Tube Q8H   isosorbide dinitrate  40 mg Per Tube TID   mouth rinse  15 mL Mouth Rinse 10 times per day   megestrol  80 mg Per Tube TID   multivitamin with minerals  1 tablet Per Tube Daily   pantoprazole sodium  40 mg Per Tube Daily   sacubitril-valsartan  1 tablet Per Tube BID   sodium chloride flush  10-40 mL Intracatheter Q12H   Continuous Infusions:  feeding supplement (OSMOLITE 1.5 CAL) 1,000 mL (06/02/21 1455)     LOS: 21 days      SDebbe Odea MD [facility] Pager: [email or web address] 06/05/2021, 1:22 PM

## 2021-06-06 DIAGNOSIS — Z7189 Other specified counseling: Secondary | ICD-10-CM | POA: Diagnosis not present

## 2021-06-06 DIAGNOSIS — I63411 Cerebral infarction due to embolism of right middle cerebral artery: Secondary | ICD-10-CM | POA: Diagnosis not present

## 2021-06-06 DIAGNOSIS — Z515 Encounter for palliative care: Secondary | ICD-10-CM | POA: Diagnosis not present

## 2021-06-06 LAB — TYPE AND SCREEN
ABO/RH(D): O POS
Antibody Screen: NEGATIVE
Donor AG Type: NEGATIVE
Donor AG Type: NEGATIVE
Unit division: 0
Unit division: 0

## 2021-06-06 LAB — GLUCOSE, CAPILLARY
Glucose-Capillary: 115 mg/dL — ABNORMAL HIGH (ref 70–99)
Glucose-Capillary: 87 mg/dL (ref 70–99)
Glucose-Capillary: 116 mg/dL — ABNORMAL HIGH (ref 70–99)
Glucose-Capillary: 87 mg/dL (ref 70–99)
Glucose-Capillary: 116 mg/dL — ABNORMAL HIGH (ref 70–99)
Glucose-Capillary: 106 mg/dL — ABNORMAL HIGH (ref 70–99)

## 2021-06-06 LAB — BASIC METABOLIC PANEL
Anion gap: 12 (ref 5–15)
BUN: 23 mg/dL — ABNORMAL HIGH (ref 6–20)
CO2: 19 mmol/L — ABNORMAL LOW (ref 22–32)
Calcium: 9.3 mg/dL (ref 8.9–10.3)
Chloride: 106 mmol/L (ref 98–111)
Creatinine, Ser: 1.07 mg/dL — ABNORMAL HIGH (ref 0.44–1.00)
GFR, Estimated: >60 mL/min (ref >60)
Glucose, Bld: 113 mg/dL — ABNORMAL HIGH (ref 70–99)
Potassium: 4.8 mmol/L (ref 3.5–5.1)
Sodium: 137 mmol/L (ref 135–145)

## 2021-06-06 LAB — BPAM RBC
Blood Product Expiration Date: 202206302359
Blood Product Expiration Date: 202207102359
Unit Type and Rh: 5100
Unit Type and Rh: 5100

## 2021-06-06 LAB — CBC
HCT: 27.4 % — ABNORMAL LOW (ref 36.0–46.0)
Hemoglobin: 8.1 g/dL — ABNORMAL LOW (ref 12.0–15.0)
MCH: 23.7 pg — ABNORMAL LOW (ref 26.0–34.0)
MCHC: 29.6 g/dL — ABNORMAL LOW (ref 30.0–36.0)
MCV: 80.1 fL (ref 80.0–100.0)
Platelets: 547 10*3/uL — ABNORMAL HIGH (ref 150–400)
RBC: 3.42 MIL/uL — ABNORMAL LOW (ref 3.87–5.11)
RDW: 32.3 % — ABNORMAL HIGH (ref 11.5–15.5)
WBC: 8.1 10*3/uL (ref 4.0–10.5)
nRBC: 0 % (ref 0.0–0.2)

## 2021-06-06 NOTE — Progress Notes (Signed)
Physical Therapy Treatment Patient Details Name: Holly Bradley MRN: [id number] DOB: 08-Nov-1976 Today's Date: 06/06/2021    History of Present Illness 45 yo female s/p mechanical thrombectomy on 5/24 of right MCA M1/M2/M3 and embolization of right MCA M3 branch due to contrast extravasation. Pt re-intubated 5/26. Repeat head CT on 5/26 showed increased cytotoxic edema and mildly increased leftward midline shift, now 5 mm, decreased SAH and IVH. Abdominal CT showing 22.9 cm mass, possibly consistent with uterine fibroids, no lymphadenopathy. s/p trach 6/3. PMH fibriods HTN HF paranoid psychosis recent hospitalizations january and february 2022    PT Comments    Patient seen in conjunction with OT to maximize activity tolerance. Patient lethargic on arrival and soiled with urine. Patient required max-totalA+2 for bed mobility and totalA for pericare. Repositioned patient higher in bed to set up for utilizing R UE on bed rail to pull trunk forward with maxA. Continue to recommend SNF for ongoing Physical Therapy.   Will attempt maximove for OOB to recliner next session.     Follow Up Recommendations  SNF     Equipment Recommendations  Wheelchair (measurements PT);Wheelchair cushion (measurements PT);Hospital bed;Other (comment) (hoyer lift)    Recommendations for Other Services       Precautions / Restrictions Precautions Precautions: Fall Precaution Comments: L hemiparesis, PRAFO, hand splint,  trach, SBP < 160, cortrak Required Braces or Orthoses: Other Brace Other Brace: L resting hand splint( on every 4 hours/ off every 4 hours) Restrictions Weight Bearing Restrictions: No    Mobility  Bed Mobility Overal bed mobility: Needs Assistance Bed Mobility: Rolling Rolling: Max assist;+2 for physical assistance;+2 for safety/equipment;Total assist         General bed mobility comments: MAX A +2 to roll to pts L side with pt able to reach across with RUE and bend RLE, total A +2 to  roll to pts R side    Transfers                 General transfer comment: deferred this session, will attempt maximove next session for OOB mobility  Ambulation/Gait                 Stairs             Wheelchair Mobility    Modified Rankin (Stroke Patients Only) Modified Rankin (Stroke Patients Only) Pre-Morbid Rankin Score: No symptoms Modified Rankin: Severe disability     Balance                                            Cognition Arousal/Alertness: Awake/alert;Lethargic (moments of wakefulness, moments of lethargy) Behavior During Therapy: Flat affect Overall Cognitive Status: Difficult to assess Area of Impairment: Following commands;Problem solving                       Following Commands: Follows one step commands with increased time     Problem Solving: Slow processing;Decreased initiation;Difficulty sequencing;Requires verbal cues;Requires tactile cues General Comments: pt nodding appropriately and following most commands with increased time, seemed to be lethargic at times presentinng with delayed responses      Exercises      General Comments General comments (skin integrity, edema, etc.): pt on 5LPM 28% trach collar with SpO2 99%, all other VSS      Pertinent Vitals/Pain Pain Assessment: Faces Faces Pain Scale: Hurts a little  bit Pain Location: generalized discomfort with coughing Pain Descriptors / Indicators: Grimacing;Discomfort Pain Intervention(s): Monitored during session    Home Living                      Prior Function            PT Goals (current goals can now be found in the care plan section) Acute Rehab PT Goals Patient Stated Goal: none stated PT Goal Formulation: Patient unable to participate in goal setting Time For Goal Achievement: 06/18/21 Potential to Achieve Goals: Fair Progress towards PT goals: Progressing toward goals    Frequency    Min 3X/week       PT Plan Current plan remains appropriate    Co-evaluation PT/OT/SLP Co-Evaluation/Treatment: Yes Reason for Co-Treatment: Complexity of the patient's impairments (multi-system involvement);For patient/therapist safety;To address functional/ADL transfers PT goals addressed during session: Mobility/safety with mobility OT goals addressed during session: ADL's and self-care      AM-PAC PT "6 Clicks" Mobility   Outcome Measure  Help needed turning from your back to your side while in a flat bed without using bedrails?: Total Help needed moving from lying on your back to sitting on the side of a flat bed without using bedrails?: Total Help needed moving to and from a bed to a chair (including a wheelchair)?: Total Help needed standing up from a chair using your arms (e.g., wheelchair or bedside chair)?: Total Help needed to walk in hospital room?: Total Help needed climbing 3-5 steps with a railing? : Total 6 Click Score: 6    End of Session Equipment Utilized During Treatment: Oxygen Activity Tolerance: Patient tolerated treatment well Patient left: in bed;with call bell/phone within reach;with bed alarm set Nurse Communication: Mobility status PT Visit Diagnosis: Hemiplegia and hemiparesis;Other abnormalities of gait and mobility (R26.89);Unsteadiness on feet (R26.81);Muscle weakness (generalized) (M62.81);Difficulty in walking, not elsewhere classified (R26.2);Other symptoms and signs involving the nervous system (R29.898);Apraxia (R48.2) Hemiplegia - Right/Left: Left Hemiplegia - dominant/non-dominant: Non-dominant Hemiplegia - caused by: Cerebral infarction;Nontraumatic SAH;Nontraumatic intracerebral hemorrhage     Time: 8182-9937 PT Time Calculation (min) (ACUTE ONLY): 20 min  Charges:  $Therapeutic Activity: 8-22 mins                     Rosamae Rocque A. Gilford Rile PT, DPT Acute Rehabilitation Services Pager [phone] Office [phone]    Linna Hoff 06/06/2021, 5:03  PM

## 2021-06-06 NOTE — Progress Notes (Signed)
Daily Progress Note   [name]       Date: 06/06/2021 DOB: 12-06-1976  Age: 45 y.o. MRN#: [id number] Attending Physician: Charlynne Cousins, MD Primary Care Physician: Mitzi Hansen, MD Admit Date: 05/15/2021  Reason for Consultation/Follow-up: Establishing goals of care  Subjective: No complaints  Length of Stay: 22  Current Medications: Scheduled Meds:  . amiodarone  200 mg Per Tube Daily  . amLODipine  10 mg Per Tube Daily  . aspirin  81 mg Per Tube Daily  . atorvastatin  40 mg Per Tube Daily  . carvedilol  25 mg Per Tube BID WC  . chlorhexidine gluconate (MEDLINE KIT)  15 mL Mouth Rinse BID  . Chlorhexidine Gluconate Cloth  6 each Topical Daily  . cloNIDine  0.1 mg Per Tube TID  . feeding supplement (PROSource TF)  45 mL Per Tube BID  . ferrous sulfate  300 mg Per Tube BID WC  . free water  200 mL Per Tube Q6H  . hydrALAZINE  100 mg Per Tube Q8H  . isosorbide dinitrate  40 mg Per Tube TID  . mouth rinse  15 mL Mouth Rinse 10 times per day  . megestrol  80 mg Per Tube TID  . multivitamin with minerals  1 tablet Per Tube Daily  . pantoprazole sodium  40 mg Per Tube Daily  . sacubitril-valsartan  1 tablet Per Tube BID  . sodium chloride flush  10-40 mL Intracatheter Q12H    Continuous Infusions: . feeding supplement (JEVITY 1.5 CAL/FIBER) 1,000 mL (06/06/21 1120)    PRN Meds: acetaminophen **OR** acetaminophen (TYLENOL) oral liquid 160 mg/5 mL **OR** acetaminophen, loperamide HCl, simethicone, sodium chloride flush  Physical Exam Constitutional:      General: She is not in acute distress.    Comments: Nods head appropriately, waves at me with R hand when I walk in room  Cardiovascular:     Rate and Rhythm: Normal rate and regular rhythm.  Abdominal:      Palpations: There is mass.  Skin:    General: Skin is warm and dry.  Neurological:     Mental Status: She is alert.            Vital Signs: BP (!) 153/82 (BP Location: Left Arm)   Pulse 97   Temp 98.9 F (37.2 C) (Oral)   Resp 20   Ht _0  (1.651 m)   Wt 76.6 kg   LMP  (LMP Unknown) Comment: pt not alert  SpO2 99%   BMI 28.10 kg/m  SpO2: SpO2: 99 % O2 Device: O2 Device: Tracheostomy Collar O2 Flow Rate: O2 Flow Rate (L/min): 5 L/min  Intake/output summary:   Intake/Output Summary (Last 24 hours) at 06/06/2021 1506 Last data filed at 06/06/2021 0100 Gross per 24 hour  Intake 600 ml  Output 1200 ml  Net -600 ml    LBM: Last BM Date: 06/05/21 Baseline Weight: Weight: 72.6 kg Most recent weight: Weight: 76.6 kg      Flowsheet Rows    Flowsheet Row Most Recent Value  Intake Tab   Referral Department Critical care  Unit at Time of Referral ICU  Palliative Care Primary Diagnosis Neurology  Date Notified 05/22/21  Palliative Care Type New Palliative care  Reason for referral Clarify Goals of Care  Date of Admission 05/15/21  Date first seen by Palliative Care 05/23/21  # of days Palliative referral response time 1 Day(s)  # of days IP prior to Palliative referral 7  Clinical Assessment   Psychosocial & Spiritual Assessment   Palliative Care Outcomes        Patient Active Problem List   Diagnosis Date Noted  . Status post tracheostomy ([place])   . Vaginal bleeding   . Abdominal distention   . Encounter for feeding tube placement   . Encounter for intubation   . Ileus ([place])   . Staphylococcus epidermidis bacteremia   . Fibroids 05/25/2021  . Iron deficiency anemia due to chronic blood loss 05/25/2021  . Cerebral edema ([place]) 05/25/2021  . Chronic HFrEF (heart failure with reduced ejection fraction) ([place]) 05/25/2021  . Pneumonia due to methicillin susceptible Staphylococcus aureus ([place]) 05/25/2021  . Acute respiratory failure with hypoxia ([place])   .  Cerebrovascular accident (CVA) ([place]) 05/15/2021    Palliative Care Assessment & Plan   HPI: 45 y.o. female  with past medical history of uncontrolled HTN, tobacco use disorder, uterine fibroids, heart failure, paroxysmal SVT, paranoid psychosis admitted on 05/15/2021 with AMS. She was diagnosed with acute large MCA stroke with subarachnoid hemorrhage and midline shift requiring mechanical ventilation for airway protection and hyperosmolar therapy for cerebral edema. PMT consulted to discuss [place]. Lurline Idol was placed 6/3.   Assessment: Much more interactive than than she was when I saw her 1 week ago - waves at me when I enter room. Nods yes and shakes head no appropriately to questions. No family at bedside. I tell her I need to discuss her care further with her brother and she shakes head yes. I explain her need for feeding tube placed in abdomen since her current tube is temporary - she nods yes. I share that surgery for her fibroids in not currently being considered d/t her current medical condition - she nods. She expresses understanding then shuts her eyes and turns head away.  I call her brother [name] - he expresses lots of frustration about current situation. He tells me he is having a hard time making the decision to proceed with G tube as he feels this will lead to [facility] "giving up". He is afraid she would not want that. I reviewed the conversation I had with Denisse with him.   Multiple times throughout conversation [name] brings up concern about her fibroids and why these are not being addressed by surgery - we discuss concern that she is not currently well enough to tolerate surgery and he asks "if she can tolerate the g tube placement, why not fibroid surgery" - we discuss that these are very different surgeries with different recoveries. He tells me hearing an explanation from GYN would be helpful to him. He shares about his concerns about her quality of life and he believes that her fibroids  are contributing to this and he tells me she was on "30 medications" d/t her fibroids and he thinks she could come off of them if fibroids were removed. He feels passionately that surgery should be reconsidered. I let him know I would GYN to reach out to him to further discuss.   When we did discuss g tube placement again, [name] expresses that he feels that this is being rushed and that Ms. Jorstad needs more time. We discuss that a  gastric tube placement to support Ms. Jeon is the next step as she is not safe to take in nutrition and hydration on her own. We review that her current feeding tube is only temporary and cannot go with her to rehab facility. He asks why she needs to go to rehab instead of staying in the hospital - this was discussed further.   [name] tells me he was told by another provider that if he declined feeding tube placement then hospice would need to be considered but he tells me he is not interested in this. We discuss that without a feeding tube she has no way of safely taking in nutrition and that is why this was stated. He expresses understanding but remains unsure on how to proceed.  We review our conversations from the beginning of patient's hospital stay - review conversations with neurologist when [name] was told that at best [facility] would be wheelchair dependent. He tells me he cannot accept this as he has friends who received poor prognoses from physicians and they were not true.  [name] shares frustration about medicaid and disability application.   I offer to [name] the option of a meeting with me and Yakira together to discuss goals of care moving forward - he thinks this would be helpful but is not sure when - he tells me he will call me tomorrow with a time.   [name] has my number and knows to call with any further questions or concerns.   Recommendations/Plan: Emotional support provided to brother - he has lots of concerns and frustrations about situation Requested  Dr. Ilda Basset call brother for further explanation on why surgery for fibroid is not an option Offered meeting with brother and patient together to further discuss goals of care - he is agreeable but going to call me back with time He is reluctant to agree to gastric tube placement but also not interested in a comfort approach   Goals of Care and Additional Recommendations: Limitations on Scope of Treatment: Full Scope Treatment  Code Status: Limited code - NO CPR  Prognosis:  Unable to determine  Discharge Planning: To Be Determined  Care plan was discussed with patient's brother, RN, and Dr. Ilda Basset  Thank you for allowing the Palliative Medicine Team to assist in the care of this patient.   Total Time 50 minutes Prolonged Time Billed  no    Greater than 50%  of this time was spent counseling and coordinating care related to the above assessment and plan.  Juel Burrow, DNP, [facility] Palliative Medicine Team Team Phone # [phone]  Pager [phone]

## 2021-06-06 NOTE — Progress Notes (Signed)
GYN Note Time: 1430  Patient sleeping comfortably and not awakened by me. I was not able to find the pure wicks that were to be saved for me to review.   I sent an inbasket message to the nurse and she states she wasn't aware they were to be saved. I asked her to save them and I will look at them tomorrow afternoon. She states that, "she did have heavy bleeding and there were several large, long clots that were very dark red in color. I would say some were about the overall size of a pen - I didn't see any that were perfectly round."  CBC Latest Ref Rng & Units 06/06/2021 06/05/2021 06/04/2021  WBC 4.0 - 10.5 K/uL 8.1 8.7 8.9  Hemoglobin 12.0 - 15.0 g/dL 8.1(L) 8.5(L) 7.6(L)  Hematocrit 36.0 - 46.0 % 27.4(L) 28.7(L) 26.2(L)  Platelets 150 - 400 K/uL 547(H) 611(H) 633(H)   Her blood counts are stable and would recommend repeating an H/H tomorrow to ensure she's at her baseline.     I had a long discussion with her brother.  He states that she's been bleeding constantly, before coming into the hospital for 5 months and would have large amounts of blood in the bathroom and that it was causing her much distress;  He believes that her bleeding will go back to what it was when she leaves the hospital and that he doesn't understand why they just can't be removed especially if she has undergo general aneshtesia for the G Tube placement.  I explained to him that she's had these fibroids going on for a long time and was being seen at least back in 2019 in [place] where a plan was in place, with GYN and IR, for a [place], but I believe she was hesitant to have anything done. She was also seen for anemia in mid 2021 in [place] and she was supposed to follow up.  GYN was then initially consulted here in early 2022 b/c she was admitted with SVT (Hgb at her baseline) but a large fibroids felt on exam so we were consulted. We saw her in clinic as an outpatient but she had SBPs in the 200s and she was recommended she go to  the hospital to take care of cardiac issues. He states that she was on the megace that she was put on by [place] after that march visit to keep any\ VB controlled but that it didn't work. He believes that the megace may have contributed to her CVA.  I told him that we don't know what necessarily caused it but she has chronic medical conditions that were not very well controlled going back to at least 2019 where her high BPs were noted at her GYN and IR visits.   Regardless, I told him that whatever bleeding she is having in the hospital has been stable after she had VB and was transfused 2U PRBCs and GYN consulted on 5/29. It does not appear that she was on megace prior to that during this hospitalization so it's likely she bled due to being off the megace, if she was on it at home, but her bleeding has been stable to the point that her H/H is at her baseline.    I explained to him that just because a procedure requires general anesthesia does not mean that they ar eof the same complexity and that a G Tube placement is very much different than a hysterectomy and I said that even if she  had no medical problems a hysterectomy would be a very difficulty and potentially morbid procedure, which is why the doctors in [place] were pursuing [place] with her which I agree with.  I told him that I would recommend continuing with medical management and if that fails then some type of surgical intervention would be warranted and that I would ask radiology to weigh in first to see if a [place] is still a viable option. He is concerned that if nothing surgically is done that she will go home and bleed like what he saw before. I told him I can't comment on what would happen if she goes home, but while she's been inpatient for the past two weeks on megace any VB she is having is not causing her to drop her counts, which means that I don't feel surgical intervention is warranted, but I told him, in no uncertain terms, that unless something  changes with her status and/or her team feels that removal of her uterus would help her disease process that a hysterectomy or a surgery to remove the fibroids is not indicated.    He is wondering about if IR would do the [place]. I told him that it may not be wise to do anything surgically for her like that while she is still recovering but he would like to have them weigh in. I sent the primary team a message to please consult IR  Durene Romans MD Attending Center for [facility]) GYN Consult Phone: [phone] (M-F, [phone]) & [phone] (Off hours, weekends, holidays)

## 2021-06-06 NOTE — Progress Notes (Signed)
Occupational Therapy Treatment Patient Details Name: Holly Bradley MRN: [id number] DOB: May 15, 1976 Today's Date: 06/06/2021    History of present illness 45 yo female s/p mechanical thrombectomy on 5/24 of right MCA M1/M2/M3 and embolization of right MCA M3 branch due to contrast extravasation. Pt re-intubated 5/26. Repeat head CT on 5/26 showed increased cytotoxic edema and mildly increased leftward midline shift, now 5 mm, decreased SAH and IVH. Abdominal CT showing 22.9 cm mass, possibly consistent with uterine fibroids, no lymphadenopathy. s/p trach 6/3. PMH fibriods HTN HF paranoid psychosis recent hospitalizations january and february 2022   OT comments  Pt seen in conjunction with PT to maxmize pts activity tolerance and participation. Pt lethargic this session and noted to be soiled with urine. Pt currently requires MAX A - total A +2 for bed mobility and total A +2 for pericare. Worked on pulling trunk forward with RUE positioned on bed rail in order to place clean pad behind pt, pt required MAX A to shift trunk anteriorly. Pt nodding appropriately during session and making gestures with her hands such as "so-so." Donned L resting hand splint at end of session and positioned LUE on pillow and alerted RN to doff splint at ~ 1905. Pt would continue to benefit from skilled occupational therapy while admitted and after d/c to address the below listed limitations in order to improve overall functional mobility and facilitate independence with BADL participation. DC plan remains appropriate, will follow acutely per POC.    Follow Up Recommendations  SNF    Equipment Recommendations  Wheelchair (measurements OT);Wheelchair cushion (measurements OT);Hospital bed;3 in 1 bedside commode    Recommendations for Other Services      Precautions / Restrictions Precautions Precautions: Fall Precaution Comments: L hemiparesis, PRAFO, hand splint,  trach, SBP < 160, cortrak Required Braces or  Orthoses: Other Brace Other Brace: L resting hand splint( on every 4 hours/ off every 4 hours) Restrictions Weight Bearing Restrictions: No       Mobility Bed Mobility Overal bed mobility: Needs Assistance Bed Mobility: Rolling Rolling: Max assist;+2 for physical assistance;+2 for safety/equipment;Total assist         General bed mobility comments: MAX A +2 to roll to pts L side with pt able to reach across with RUE and bend RLE, total A +2 to roll to pts R side    Transfers                 General transfer comment: deferred this session    Balance                                           ADL either performed or assessed with clinical judgement   ADL Overall ADL's : Needs assistance/impaired     Grooming: Wash/dry face;Oral care;Total assistance;Bed level                       Toileting- Clothing Manipulation and Hygiene: Total assistance;+2 for physical assistance;Bed level Toileting - Clothing Manipulation Details (indicate cue type and reason): Total A +2 for peri care and rolling     Functional mobility during ADLs: Moderate assistance;Total assistance;+2 for physical assistance (bed mobility) General ADL Comments: pt continues to requries total A for ADLs and MAX- total A +2 for bed mobility     Vision       Perception  Praxis      Cognition Arousal/Alertness: Awake/alert;Lethargic (moments of wakefulness moments of lethargy) Behavior During Therapy: Flat affect Overall Cognitive Status: Difficult to assess Area of Impairment: Following commands;Problem solving                       Following Commands: Follows one step commands with increased time     Problem Solving: Slow processing;Decreased initiation;Difficulty sequencing;Requires verbal cues;Requires tactile cues General Comments: pt nodding appropriately and following most commands with increased time, seemed to be lethargic at times presentinng  with delayed responses        Exercises  Positioned RUE on bed rail and worked on shifting trunk anteriorly to place new pad.    Shoulder Instructions       General Comments pt on 5LPM 28% trach collar with SpO2 99%, all other VSS, donned L PRAFO at end of session and L resting hand splint.     Pertinent Vitals/ Pain       Pain Assessment: Faces Faces Pain Scale: Hurts a little bit Pain Location: generalized discomfort with coughing Pain Descriptors / Indicators: Grimacing;Discomfort Pain Intervention(s): Limited activity within patient's tolerance;Monitored during session;Repositioned  Home Living                                          Prior Functioning/Environment              Frequency  Min 2X/week        Progress Toward Goals  OT Goals(current goals can now be found in the care plan section)  Progress towards OT goals: Progressing toward goals  Acute Rehab OT Goals Patient Stated Goal: none stated OT Goal Formulation: Patient unable to participate in goal setting Time For Goal Achievement: 06/12/21 Potential to Achieve Goals: Good  Plan Discharge plan remains appropriate;Frequency remains appropriate    Co-evaluation      Reason for Co-Treatment: Complexity of the patient's impairments (multi-system involvement);For patient/therapist safety;To address functional/ADL transfers   OT goals addressed during session: ADL's and self-care      AM-PAC OT "6 Clicks" Daily Activity     Outcome Measure   Help from another person eating meals?: Total Help from another person taking care of personal grooming?: A Lot Help from another person toileting, which includes using toliet, bedpan, or urinal?: Total Help from another person bathing (including washing, rinsing, drying)?: Total Help from another person to put on and taking off regular upper body clothing?: A Lot Help from another person to put on and taking off regular lower body  clothing?: Total 6 Click Score: 8    End of Session Equipment Utilized During Treatment: Oxygen;Other (comment) (5LPM 28% trach collar)  OT Visit Diagnosis: Unsteadiness on feet (R26.81);Muscle weakness (generalized) (M62.81);Hemiplegia and hemiparesis Hemiplegia - Right/Left: Left Hemiplegia - dominant/non-dominant: Non-Dominant Hemiplegia - caused by: Cerebral infarction   Activity Tolerance Patient tolerated treatment well   Patient Left in bed;with call bell/phone within reach;with bed alarm set   Nurse Communication Mobility status;Other (comment) (please take off resting hand splint at 1905)        Time: 1497-0263 OT Time Calculation (min): 20 min  Charges: OT General Charges $OT Visit: 1 Visit [name], COTA/L Acute Rehabilitation Services [phone]    Holly Bradley 06/06/2021, 4:07 PM

## 2021-06-06 NOTE — Progress Notes (Signed)
[facility] Progress Note  22 Days Post-Op  Subjective: CC:  Resting comfortably. No family at bedside. TF running at goal. Palliative following  TMAX 100.1 Objective: Vital signs in last 24 hours: Temp:  [98.9 F (37.2 C)-100.5 F (38.1 C)] 99.6 F (37.6 C) (06/15 0403) Pulse Rate:  [82-92] 90 (06/15 0818) Resp:  [16-21] 20 (06/15 0818) BP: (114-141)/(62-80) 141/80 (06/15 0719) SpO2:  [94 %-100 %] 100 % (06/15 0818) FiO2 (%):  [21 %] 21 % (06/15 0818) Weight:  [76.6 kg] 76.6 kg (06/15 0500) Last BM Date: 06/05/21  Intake/Output from previous day: 06/14 0701 - 06/15 0700 In: 600 [NG/GT:600] Out: 1500 [Urine:1500] Intake/Output this shift: No intake/output data recorded.  PE: Gen:  Alert, NAD, cooperative and appropriate Head: cortrak in R nare w/ TF running at 50 cc/hr Neck: trach in place on TC Card:  Regular rate and rhythm, pedal pulses 2+ BL Pulm:  Normal effort, rales left lung base Abd: Soft, non-tender, non-distended, large, palpable mass from pelvis up past umbilicus. Skin: warm and dry, no rashes  Psych: A&Ox3   Lab Results:  Recent Labs    06/05/21 1345 06/06/21 0329  WBC 8.7 8.1  HGB 8.5* 8.1*  HCT 28.7* 27.4*  PLT 611* 547*   BMET Recent Labs    06/04/21 0457 06/06/21 0329  NA 136 137  K 4.5 4.8  CL 105 106  CO2 22 19*  GLUCOSE 127* 113*  BUN 21* 23*  CREATININE 1.10* 1.07*  CALCIUM 9.3 9.3   PT/INR No results for input(s): LABPROT, INR in the last 72 hours. CMP     Component Value Date/Time   NA 137 06/06/2021 0329   K 4.8 06/06/2021 0329   CL 106 06/06/2021 0329   CO2 19 (L) 06/06/2021 0329   GLUCOSE 113 (H) 06/06/2021 0329   BUN 23 (H) 06/06/2021 0329   CREATININE 1.07 (H) 06/06/2021 0329   CALCIUM 9.3 06/06/2021 0329   PROT 6.1 (L) 06/04/2021 0457   ALBUMIN 2.0 (L) 06/04/2021 0457   AST 26 06/04/2021 0457   ALT 13 06/04/2021 0457   ALKPHOS 144 (H) 06/04/2021 0457   BILITOT 0.4 06/04/2021 0457   GFRNONAA  >60 06/06/2021 0329   Lipase  No results found for: LIPASE     Studies/Results: No results found.  Anti-infectives: Anti-infectives (From admission, onward)    Start     Dose/Rate Route Frequency Ordered Stop   05/30/21 1515  ceFAZolin (ANCEF) IVPB 2g/100 mL premix  Status:  Discontinued        2 g 200 mL/hr over 30 Minutes Intravenous Every 8 hours 05/30/21 1415 05/31/21 1404   05/21/21 1100  ceFAZolin (ANCEF) IVPB 2g/100 mL premix        2 g 200 mL/hr over 30 Minutes Intravenous Every 8 hours 05/21/21 1030 05/28/21 0537        Assessment/Plan  Dysphagia Consult for PEG placement - tolerating TF via cortrak at 50 cc/hr and having BMs. - patient also with 22.9 x 22.9 x 13.8 cm uterine fibroid and abdomen firm on palpation - patient seems to be clinically improving from MSSA PNA and possible bacteremia, now off of abx and afebrile for about 48 hours. - failed swallow eval earlier in admission, s/p trach now but still has cuffed trach - consider re-eval once changed to cuffless trach; per CCM note 6/13 planning for possible transition to cuffless trach after surgical G tube, just incase pt needs assisted ventilation for procedure   -  patients brother currently refusing G-tube. ongoing discussions with patients brother regarding placement of surgical G tube. He did say the patient would not want a permanent feeding tube. If the patient does progress from a respiratory and speech standpoint then the feeding tube could be removed in as little as 6-8 weeks after surgery if it was no longer needed. The brother's biggest concern during our conversation was re-visiting the idea of surgery to remove the patients chronically bleeding uterine fibroids. Spoke with Dr. Olevia Bowens this morning - he states the patients brother will have a final decision regarding G-tube tomorrow. CCS will remain available should the patient/family decide they'd like a G-tube.  - below per primary attending - R MCA  CVA complicated by [facility] with midline shift and cerebral edema Acute on chronic anemia, multifactorial AKI S/P tracheostomy MSSA PNA - completed course of ancef x 7 days ?staph epidermis bacteremia - blood cx 5/30 positive for staph epidermis and staph hominis, per ID monitor off of abx for now, staph epi should have been covered by 7d ancef.  Leukocytosis - resolved  T2DM controlled with diet/exercise Uncontrolled HTN HLD CHF with pEF SVT Uterine fibroids - with chronic vaginal bleeding, H&H stable 8.1/27.4 Hx of paranoid psychosis    LOS: 22 days    Obie Dredge, [facility] Please see Amion for pager number during day hours 7:00am-4:30pm

## 2021-06-06 NOTE — Progress Notes (Signed)
[facility] PROGRESS NOTE    Progress Note  Lynnelle Mesmer  UYZ:[id number] DOB: Dec 05, 1976 DOA: 05/15/2021 PCP: Mitzi Hansen, MD     Brief Narrative:   Laketra Bowdish is an 45 y.o. female past medical history of essential hypertension, diabetes mellitus type 2 large uterine fibroids paroxysmal SVT, chronic diastolic heart failure paranoid psychosis comes into the ED on 05/16/2019 with confusion was found to have a right MCA occlusion underwent thrombectomy which was complicated by contrast extravasation and M3 coiling embolization and revascularization.  She subsequently developed subarachnoid hemorrhage repeat imaging revealed right basal ganglia infarct with a 3 mm left shift, she was treated with hypertonic saline in the ICU, on 05/17/2021 she developed respiratory distress and he was intubated CT of the head showed increased cytotoxic edema and left side shift.  No mental status improvement is now trach and with nasal collar track for tube feedings.  She has been treated with Ancef and completed a 7-day course for MSSA pneumonia, her blood cultures on 05/21/2021 were positive for staph epi.  Infectious disease was consulted and are on board she has remained off antibiotics since 2022 repeated blood cultures on 06/02/2021 have remained negative.   Significant Events: 5/24 Admit to neurology after ED visit with AMS, found to have R MCA stroke, underwent mechanical thrombectomy, M3 coil embolization, subsequent SAH 5/26 decreased mental status and respiratory distress, intubated 6/1 still not candidate for extubation with poor mentation and cough mechanics. Family unsure if pt would want trach. Staph epi bacteremia, continues on ancef 6/2 moving RUE RLE following some commands. decision to move forward with tracheostomy.   6/3 perc trach. Slight WBC increase but no fever, continues on ancef 6/4 Tolerating PSV/CPAP 6/5 WBC up to 17, last day of ancef for staph pna. Trying ATC  6/9 ID  consulted for staph epi bacteremia . Felt contamination and stopped abx. 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 Stroke team on board, they relate that there was salts over hypercoagulable work-up revealed the presence of lupus, they do recommend repeating a hypercoagulable panel in 12 weeks. Her brother is now stating that he would not want a permanent G-tube General surgery has been following with the plan for open G-tube due to extensive fibroid and difficult anatomy. Currently on aspirin and Lipitor.  Acute respiratory failure with hypoxia: Currently trach PCCM following plan to see on Thursday for trach care.  MSSA pneumonia: Completed 1 week course of antibiotics.  Sepsis due to staph epi there may to miss bacteremia: ID on board they recommended to hold on antibiotics as repeated blood cultures appear likely contaminant. T-max of 100.5 continues to improve.  Thrombocytosis: Mild has had no further increase they are improving.  Is an acute phase reactant.  Uterine fibroid: Extremely large palpable on exam OB/GYN was consulted they recommended to continue Megace. OB/GYN reevaluated.  They did not recommend hysterectomy at a more surgical remover due to her current medical condition.  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  Has remained relatively stable, will continue to monitor intermittently.  Nutrition: Continue tube feedings, through core track.  History of paroxysmal SVT: Continue home amiodarone dose.  Goals of care: We will ask palliative care to see the patient as she has a poor  prognosis.   DVT prophylaxis: scd Family Communicationbrother Status is: Inpatient  Remains inpatient appropriate because:Hemodynamically unstable  Dispo: The patient is from: Home              Anticipated d/c is to: LTAC              Patient currently is not medically stable to d/c.   Difficult to place patient No        Code Status:     Code Status Orders  (From admission, onward)           Start     Ordered   05/30/21 1101  Limited resuscitation (code)  Continuous       Question Answer Comment  In the event of cardiac or respiratory ARREST: Initiate Code Blue, Call Rapid Response Yes   In the event of cardiac or respiratory ARREST: Perform CPR No   In the event of cardiac or respiratory ARREST: Perform Intubation/Mechanical Ventilation Yes   In the event of cardiac or respiratory ARREST: Use NIPPV/BiPAp only if indicated Yes   In the event of cardiac or respiratory ARREST: Administer ACLS medications if indicated Yes   In the event of cardiac or respiratory ARREST: Perform Defibrillation or Cardioversion if indicated Yes      05/30/21 1101           Code Status History     Date Active Date Inactive Code Status Order ID Comments User Context   05/15/2021 0754 05/30/2021 1101 Full Code [id number]  Greta Doom, MD ED         IV Access:   Peripheral IV   Procedures and diagnostic studies:   No results found.   Medical Consultants:   None.   Subjective:    Kadence Mikkelson nonverbal  Objective:    Vitals:   06/05/21 1944 06/05/21 2344 06/06/21 0403 06/06/21 0500  BP: 131/73 123/68 134/79   Pulse: 87 86 89   Resp: '19 17 19   ' Temp: (!) 100.5 F (38.1 C) 100 F (37.8 C) 99.6 F (37.6 C)   TempSrc: Axillary Axillary Axillary   SpO2: 94% 99% 98%   Weight:    76.6 kg  Height:       SpO2: 98 % O2 Flow Rate (L/min): 5 L/min FiO2 (%): 21 %   Intake/Output Summary (Last 24 hours) at 06/06/2021 0708 Last data filed at  06/06/2021 0100 Gross per 24 hour  Intake 600 ml  Output 1500 ml  Net -900 ml   Filed Weights   05/31/21 0355 05/31/21 1528 06/06/21 0500  Weight: 80.2 kg 76.6 kg 76.6 kg    Exam: General exam: In no acute distress. Respiratory system: Good air movement and clear to auscultation. Cardiovascular system: S1 & S2 heard, RRR. No JVD. Gastrointestinal system: Abdomen is distended likely due to fibroids, soft and nontender.  Extremities: No pedal edema. Skin: No rashes, lesions or ulcers Data Reviewed:    Labs: Basic Metabolic Panel: Recent Labs  Lab 06/01/21 0255 06/02/21 0306 06/03/21 1613 06/04/21 0457 06/06/21 0329  NA 135 137 136 136 137  K 4.6 4.6 5.2* 4.5 4.8  CL 107 108 108 105 106  CO2 20* 19* 19* 22 19*  GLUCOSE 110* 125* 81 127* 113*  BUN '18 20 20 ' 21* 23*  CREATININE 1.04* 1.14* 1.09* 1.10* 1.07*  CALCIUM 9.1 9.3 9.6 9.3 9.3  GFR Estimated Creatinine Clearance: 66.5 mL/min (A) (by C-G formula based on SCr of 1.07 mg/dL (H)). Liver Function Tests: Recent Labs  Lab 06/03/21 1613 06/04/21 0457  AST 38 26  ALT 14 13  ALKPHOS 163* 144*  BILITOT 0.2* 0.4  PROT 6.4* 6.1*  ALBUMIN 2.2* 2.0*   No results for input(s): LIPASE, AMYLASE in the last 168 hours. No results for input(s): AMMONIA in the last 168 hours. Coagulation profile No results for input(s): INR, PROTIME in the last 168 hours. COVID-19 Labs  Recent Labs    06/03/21 1008  DDIMER 8.53*    Lab Results  Component Value Date   [place] NEGATIVE 05/15/2021    CBC: Recent Labs  Lab 05/31/21 0314 06/01/21 0255 06/02/21 0306 06/03/21 0903 06/04/21 0457 06/05/21 1345 06/06/21 0329  WBC 13.1*   < > 11.5* 11.5* 8.9 8.7 8.1  NEUTROABS 11.1*  --   --   --   --   --   --   HGB 8.0*   < > 7.9* 8.6* 7.6* 8.5* 8.1*  HCT 27.2*   < > 27.0* 29.0* 26.2* 28.7* 27.4*  MCV 76.6*   < > 78.3* 79.2* 79.6* 79.1* 80.1  PLT 533*   < > 638* 693* 633* 611* 547*   < > = values in this interval not  displayed.   Cardiac Enzymes: No results for input(s): CKTOTAL, CKMB, CKMBINDEX, TROPONINI in the last 168 hours. BNP (last 3 results) No results for input(s): PROBNP in the last 8760 hours. CBG: Recent Labs  Lab 06/05/21 1131 06/05/21 1533 06/05/21 2028 06/05/21 2349 06/06/21 0400  GLUCAP 117* 122* 105* 115* 115*   D-Dimer: Recent Labs    06/03/21 1008  DDIMER 8.53*   Hgb A1c: No results for input(s): HGBA1C in the last 72 hours. Lipid Profile: No results for input(s): CHOL, HDL, LDLCALC, TRIG, CHOLHDL, LDLDIRECT in the last 72 hours. Thyroid function studies: No results for input(s): TSH, T4TOTAL, T3FREE, THYROIDAB in the last 72 hours.  Invalid input(s): FREET3 Anemia work up: No results for input(s): VITAMINB12, FOLATE, FERRITIN, TIBC, IRON, RETICCTPCT in the last 72 hours. Sepsis Labs: Recent Labs  Lab 05/31/21 1114 06/01/21 0255 06/03/21 0903 06/04/21 0457 06/05/21 1345 06/06/21 0329  WBC  --    < > 11.5* 8.9 8.7 8.1  LATICACIDVEN 0.7  --   --   --   --   --    < > = values in this interval not displayed.   Microbiology Recent Results (from the past 240 hour(s))  Culture, Respiratory w Gram Stain     Status: None   Collection Time: 05/28/21  7:43 AM   Specimen: Tracheal Aspirate; Respiratory  Result Value Ref Range Status   Specimen Description TRACHEAL ASPIRATE  Final   Special Requests NONE  Final   Gram Stain   Final    FEW WBC PRESENT,BOTH PMN AND MONONUCLEAR RARE GRAM POSITIVE COCCI IN PAIRS    Culture   Final    Normal respiratory flora-no Staph aureus or Pseudomonas seen Performed at [facility], [address], [place], [place] [address]    Report Status 05/30/2021 FINAL  Final  Culture, blood (routine x 2)     Status: Abnormal   Collection Time: 05/28/21  8:27 AM   Specimen: BLOOD  Result Value Ref Range Status   Specimen Description BLOOD RIGHT ANTECUBITAL  Final   Special Requests   Final    BOTTLES DRAWN AEROBIC AND  ANAEROBIC Blood  Culture adequate volume   Culture  Setup Time   Final    GRAM POSITIVE COCCI IN CLUSTERS AEROBIC BOTTLE ONLY CRITICAL VALUE NOTED.  VALUE IS CONSISTENT WITH PREVIOUSLY REPORTED AND CALLED VALUE. Performed at [facility], [place] [address], [place], [place] [address]    Culture STAPHYLOCOCCUS EPIDERMIDIS (A)  Final   Report Status 05/31/2021 FINAL  Final   Organism ID, Bacteria STAPHYLOCOCCUS EPIDERMIDIS  Final      Susceptibility   Staphylococcus epidermidis - MIC*    CIPROFLOXACIN >=8 RESISTANT Resistant     ERYTHROMYCIN >=8 RESISTANT Resistant     GENTAMICIN <=0.5 SENSITIVE Sensitive     OXACILLIN >=4 RESISTANT Resistant     TETRACYCLINE 2 SENSITIVE Sensitive     VANCOMYCIN <=0.5 SENSITIVE Sensitive     TRIMETH/SULFA 80 RESISTANT Resistant     CLINDAMYCIN >=8 RESISTANT Resistant     RIFAMPIN <=0.5 SENSITIVE Sensitive     Inducible Clindamycin NEGATIVE Sensitive     * STAPHYLOCOCCUS EPIDERMIDIS  Culture, blood (routine x 2)     Status: Abnormal   Collection Time: 05/28/21  8:28 AM   Specimen: BLOOD  Result Value Ref Range Status   Specimen Description BLOOD RIGHT ANTECUBITAL  Final   Special Requests   Final    BOTTLES DRAWN AEROBIC AND ANAEROBIC Blood Culture adequate volume   Culture  Setup Time   Final    GRAM POSITIVE COCCI IN CLUSTERS AEROBIC BOTTLE ONLY CRITICAL VALUE NOTED.  VALUE IS CONSISTENT WITH PREVIOUSLY REPORTED AND CALLED VALUE. Performed at [facility], [place] [address], [place], [place] [address]    Culture STAPHYLOCOCCUS EPIDERMIDIS (A)  Final   Report Status 06/02/2021 FINAL  Final   Organism ID, Bacteria STAPHYLOCOCCUS EPIDERMIDIS  Final      Susceptibility   Staphylococcus epidermidis - MIC*    CIPROFLOXACIN >=8 RESISTANT Resistant     ERYTHROMYCIN <=0.25 SENSITIVE Sensitive     GENTAMICIN <=0.5 SENSITIVE Sensitive     OXACILLIN >=4 RESISTANT Resistant     TETRACYCLINE 2 SENSITIVE Sensitive     VANCOMYCIN <=0.5 SENSITIVE  Sensitive     TRIMETH/SULFA 80 RESISTANT Resistant     CLINDAMYCIN <=0.25 SENSITIVE Sensitive     RIFAMPIN <=0.5 SENSITIVE Sensitive     Inducible Clindamycin NEGATIVE Sensitive     * STAPHYLOCOCCUS EPIDERMIDIS  Culture, blood (Routine X 2) w Reflex to ID Panel     Status: None (Preliminary result)   Collection Time: 06/02/21  2:17 PM   Specimen: BLOOD LEFT HAND  Result Value Ref Range Status   Specimen Description BLOOD LEFT HAND  Final   Special Requests   Final    BOTTLES DRAWN AEROBIC AND ANAEROBIC Blood Culture adequate volume   Culture   Final    NO GROWTH 3 DAYS Performed at [facility], [address], [place], [place] [address]    Report Status PENDING  Incomplete  Culture, blood (Routine X 2) w Reflex to ID Panel     Status: None (Preliminary result)   Collection Time: 06/02/21  2:22 PM   Specimen: BLOOD  Result Value Ref Range Status   Specimen Description BLOOD LEFT ANTECUBITAL  Final   Special Requests   Final    BOTTLES DRAWN AEROBIC AND ANAEROBIC Blood Culture adequate volume   Culture   Final    NO GROWTH 3 DAYS Performed at [facility], [place] [address], [place], [place] [address]    Report Status PENDING  Incomplete     Medications:    amiodarone  200 mg Per Tube Daily   amLODipine  10 mg Per Tube Daily   aspirin  81 mg Per Tube Daily   atorvastatin  40 mg Per Tube Daily   carvedilol  25 mg Per Tube BID WC   chlorhexidine gluconate (MEDLINE KIT)  15 mL Mouth Rinse BID   Chlorhexidine Gluconate Cloth  6 each Topical Daily   cloNIDine  0.1 mg Per Tube TID   feeding supplement (PROSource TF)  45 mL Per Tube BID   ferrous sulfate  300 mg Per Tube BID WC   free water  200 mL Per Tube Q6H   hydrALAZINE  100 mg Per Tube Q8H   isosorbide dinitrate  40 mg Per Tube TID   mouth rinse  15 mL Mouth Rinse 10 times per day   megestrol  80 mg Per Tube TID   multivitamin with minerals  1 tablet Per Tube Daily   pantoprazole sodium  40 mg Per Tube Daily    sacubitril-valsartan  1 tablet Per Tube BID   sodium chloride flush  10-40 mL Intracatheter Q12H   Continuous Infusions:  feeding supplement (JEVITY 1.5 CAL/FIBER)        LOS: 22 days   Charlynne Cousins  [facility]  06/06/2021, 7:08 AM

## 2021-06-07 DIAGNOSIS — R1310 Dysphagia, unspecified: Secondary | ICD-10-CM

## 2021-06-07 DIAGNOSIS — D219 Benign neoplasm of connective and other soft tissue, unspecified: Secondary | ICD-10-CM

## 2021-06-07 DIAGNOSIS — J9601 Acute respiratory failure with hypoxia: Secondary | ICD-10-CM | POA: Diagnosis not present

## 2021-06-07 DIAGNOSIS — I63411 Cerebral infarction due to embolism of right middle cerebral artery: Secondary | ICD-10-CM | POA: Diagnosis not present

## 2021-06-07 DIAGNOSIS — Z930 Tracheostomy status: Secondary | ICD-10-CM | POA: Diagnosis not present

## 2021-06-07 DIAGNOSIS — I5022 Chronic systolic (congestive) heart failure: Secondary | ICD-10-CM | POA: Diagnosis not present

## 2021-06-07 DIAGNOSIS — G936 Cerebral edema: Secondary | ICD-10-CM | POA: Diagnosis not present

## 2021-06-07 LAB — GLUCOSE, CAPILLARY
Glucose-Capillary: 107 mg/dL — ABNORMAL HIGH (ref 70–99)
Glucose-Capillary: 109 mg/dL — ABNORMAL HIGH (ref 70–99)
Glucose-Capillary: 100 mg/dL — ABNORMAL HIGH (ref 70–99)
Glucose-Capillary: 91 mg/dL (ref 70–99)
Glucose-Capillary: 94 mg/dL (ref 70–99)
Glucose-Capillary: 101 mg/dL — ABNORMAL HIGH (ref 70–99)

## 2021-06-07 LAB — CBC
HCT: 30.2 % — ABNORMAL LOW (ref 36.0–46.0)
Hemoglobin: 8.9 g/dL — ABNORMAL LOW (ref 12.0–15.0)
MCH: 23.9 pg — ABNORMAL LOW (ref 26.0–34.0)
MCHC: 29.5 g/dL — ABNORMAL LOW (ref 30.0–36.0)
MCV: 81.2 fL (ref 80.0–100.0)
Platelets: 531 10*3/uL — ABNORMAL HIGH (ref 150–400)
RBC: 3.72 MIL/uL — ABNORMAL LOW (ref 3.87–5.11)
WBC: 9 10*3/uL (ref 4.0–10.5)
nRBC: 0 % (ref 0.0–0.2)

## 2021-06-07 LAB — CULTURE, BLOOD (ROUTINE X 2)
Culture: NO GROWTH
Culture: NO GROWTH
Special Requests: ADEQUATE
Special Requests: ADEQUATE

## 2021-06-07 NOTE — Progress Notes (Signed)
NAME:  Holly Bradley, MRN:  [id number], DOB:  1976/02/16, LOS: 23  ADMISSION DATE:  05/15/2021, CONSULTATION DATE:  06/07/21 REFERRING MD:  Leonie Man, CHIEF COMPLAINT:  Respiratory failure   History of Present Illness:  Holly Bradley is a [name] with PMH of HTN, tobacco use, HFpF, DMT2 (controlled with diet and exercise), pSVT, uterine fibroids, paranoid psychosis who presented with AMS on 5/24.   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  On follow up imaging, she had stable appearing SAH and continued R basal ganglia infarct with 42mm R to L shift.   She was admitted to the Neuro ICU and treating with hypertonic saline, statin and BP control.  On 5/26, PCCM consulted when pt became acutely less responsive with respiratory distress along with abdominal distension worse than baseline, per patient.  She was intubated and repeat head CT, abd/pelvis CT ordered.  Repeat head CT on 5/26 showed increased cytotoxic edema and mildly increased leftward midline shift, now 5 mm, decreased SAH and IVH.  Abdominal CT showing 22.9 cm mass, possibly consistent with uterine fibroids, no lymphadenopathy.      Significant Hospital Events: Including procedures, antibiotic start and stop dates in addition to other pertinent events   5/24 Admit to neurology after ED visit with AMS, found to have R MCA stroke, underwent mechanical thrombectomy, M3 coil embolization, subsequent SAH 5/26 decreased mental status and respiratory distress, intubated  6/1 still not candidate for extubation with poor mentation and cough mechanics. Family unsure if pt would want trach. Staph epi bacteremia, continues on ancef  6/2 moving RUE RLE following some commands. decision to move forward with tracheostomy.   6/3 perc trach. Slight WBC increase but no  fever, continues on ancef  6/4 Tolerating PSV/CPAP  6/5 WBC up to 17, last day of ancef for staph pna. Trying ATC  6/9 ID consulted for staph epi bacteremia . Felt contamination and stopped abx.  6/12 had fever spikes but nothing to explain  6/14 Tmax 100.1, no leukocytosis, no fevers since.  Interim History / Subjective:  Tmax 100.1 on 6/14 but no leukocytosis. Being monitored off antibiotics. Family has declined PEG tube, but wants to continue all aggressive care. Palliative care has been involved in her ongoing care.  Objective   Blood pressure (!) 150/85, pulse 95, temperature 98.2 F (36.8 C), temperature source Axillary, resp. rate 18, height 5\' 5"  (1.651 m), weight 74.1 kg, SpO2 99 %.    FiO2 (%):  [21 %] 21 %   Intake/Output Summary (Last 24 hours) at 06/07/2021 0811 Last data filed at 06/07/2021 0100 Gross per 24 hour  Intake 10 ml  Output 1600 ml  Net -1590 ml    Filed Weights   05/31/21 1528 06/06/21 0500 06/07/21 0500  Weight: 76.6 kg 76.6 kg 74.1 kg   General: chronically ill appearing woman lying in bed in NAD HEENT: /AT, eyes anicteric Neck: trach in place without erythema or drainage around trach. Shiley # 6 cuffed Pulm: breathing comfortably on TC, no wheezing or rhonchi Card S1S2, RRR Abd : fibroids hard in lower abdomen, soft Extremities: moving R side, left side in contracture braces Neuro: moving R side, tracking to R side of the bed, nodding answering some questions but not attempting to talk  Labs/imaging that I havepersonally reviewed  (right click and "Reselect all SmartList  Selections" daily)  6/11 blood cultures> NG WBC  8.1  Assessment & Plan:   Acute respiratory failure with hypoxia S/p tracheostomy MSSA pneumonia (completed rx) Acute encephalopathy: Multifactorial, in setting of intracranial process below, delirium, infection/metabolic abnormalities  R MCA CVA complicated with with SAH and midline shift, cerebral edema  Staph epidermis  bacteremia (not clear if contaminant; treated) Sepsis due to staph a. 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 No plans for PEG due to family preference.   Plan -Ok to downsize to cuffless Shiley #6 given no plans for surgical procedures at this time. Con't routine trach care. -Agree that a PEG would be in her best interest if family consents. Worry about the risk of sinusitis with long-term NGT use, which could complicate her course by colonizing her respiratory tract that may precipitate more lower respiratory infections such as tracheitis and pneumonia. -Still not candidate for decannulation at this point. Waiting on more effective airway clearance. Ok to progress with PMV and general rehabilitation. Appreciate SLP's input.  Rest of care per primary. PCCM will continue to follow 2 days per week. Please call in the interim with questions.  Best Practice (right click and "Reselect all SmartList Selections" daily)   Per primary    Julian Hy, DO 06/07/21 9:07 AM [facility]  For contact information, see Amion. If no response to pager, please call PCCM consult pager. After hours, 7PM- 7AM, please call Elink.

## 2021-06-07 NOTE — Progress Notes (Addendum)
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 Pt re-intubated 5/26. Repeat head CT on 5/26 showed increased cytotoxic edema and mildly increased leftward midline shift, now 5 mm, decreased SAH and IVH. Abdominal CT showing 22.9 cm mass, possibly consistent with uterine fibroids, no lymphadenopathy. s/p trach 6/3. PMH fibriods HTN HF paranoid psychosis recent hospitalizations january and february 2022    PT Comments    Patient seen in conjunction with OT to attempt OOB transfer to recliner with maximove. Patient required max-totalA+2 for bed mobility to place lift pad followed by dependent transfer to recliner. Patient tolerated transfer with stable HR but once in recliner for ~8 minutes, patient with drop in BP. Therefore, returned patient back to bed with maximove with BP returning to functional limits. Will continue to progress patient to OOB in recliner as BP stabilizes. Continue to recommend SNF for ongoing Physical Therapy.     BP from supine- 142/85 ( 100) HR 102 SpO2 96% Sitting in recliner 124/72 ( 86) HR 103 SpO2 97% sitting in chair ~ 5 mins 109/61 (73) HR 97 SpO2 98% sitting in recliner ~ 8 mins 104/62 (74) HR 94 SpO2 98% Returned to supine 115/70 (84) HR 95 SpO2 100%.    Follow Up Recommendations  SNF     Equipment Recommendations  Wheelchair (measurements PT);Wheelchair cushion (measurements PT);Hospital bed;Other (comment) (hoyer lift)    Recommendations for Other Services       Precautions / Restrictions Precautions Precautions: Fall Precaution Comments: L hemiparesis, PRAFO, hand splint,  trach, SBP < 160, cortrak Required Braces or Orthoses: Other Brace Other Brace: L resting hand splint( on every 4 hours/ off every 4  hours) Restrictions Weight Bearing Restrictions: No    Mobility  Bed Mobility Overal bed mobility: Needs Assistance Bed Mobility: Rolling Rolling: Max assist;+2 for physical assistance;+2 for safety/equipment;Total assist         General bed mobility comments: MAX A +2 to roll to pts L side with pt able to reach across with RUE and bend RLE, total A +2 to roll to pts R side    Transfers                 General transfer comment: dependent transfer to recliner with maximove, pt with drop in BP in recliner therefore returned pt to bed  Ambulation/Gait                 Stairs             Wheelchair Mobility    Modified Rankin (Stroke Patients Only) Modified Rankin (Stroke Patients Only) Pre-Morbid Rankin Score: No symptoms Modified Rankin: Severe disability     Balance                                            Cognition Arousal/Alertness: Awake/alert;Lethargic Behavior During Therapy: Flat affect Overall Cognitive Status: Difficult to assess Area of Impairment: Following commands;Problem solving                       Following Commands: Follows one step commands with increased time;Follows one step commands inconsistently     Problem Solving: Slow processing;Decreased  initiation;Difficulty sequencing;Requires verbal cues;Requires tactile cues General Comments: pt continuing to nod appropriately during session but more tearful and slow to respond during session needing increased time to follow commands      Exercises General Exercises - Lower Extremity Heel Slides: AROM;Right;5 reps Straight Leg Raises: 5 reps;AROM;Right    General Comments General comments (skin integrity, edema, etc.): pt on 5LPM 28% trach collar with SpO2 >96%. BR from supine- 142/85 ( 100) HR 102 SpO2 96%, Sitting in recliner 124/72 ( 86) HR 103 SpO2 97%, sitting in chair ~ 5 mins 109/61 (73) HR 97 SpO2 98%, sitting in recliner ~ 7 mins 104/62  (74) HR 94 SpO2 98%, Returned to supine 115/70 (84) HR 95 SpO2 100%. noted green bile leaking from feeding tube, alerted RN- pt also experiencing vaginal bleeding- alerted RN      Pertinent Vitals/Pain Pain Assessment: Faces Faces Pain Scale: Hurts a little bit Pain Location: generalized discomfort with coughing Pain Descriptors / Indicators: Grimacing;Discomfort Pain Intervention(s): Monitored during session;Repositioned    Home Living                      Prior Function            PT Goals (current goals can now be found in the care plan section) Acute Rehab PT Goals Patient Stated Goal: none stated PT Goal Formulation: Patient unable to participate in goal setting Time For Goal Achievement: 06/18/21 Potential to Achieve Goals: Fair Progress towards PT goals: Progressing toward goals    Frequency    Min 3X/week      PT Plan Current plan remains appropriate    Co-evaluation PT/OT/SLP Co-Evaluation/Treatment: Yes Reason for Co-Treatment: Complexity of the patient's impairments (multi-system involvement);For patient/therapist safety;To address functional/ADL transfers PT goals addressed during session: Mobility/safety with mobility;Balance OT goals addressed during session: ADL's and self-care      AM-PAC PT "6 Clicks" Mobility   Outcome Measure  Help needed turning from your back to your side while in a flat bed without using bedrails?: Total Help needed moving from lying on your back to sitting on the side of a flat bed without using bedrails?: Total Help needed moving to and from a bed to a chair (including a wheelchair)?: Total Help needed standing up from a chair using your arms (e.g., wheelchair or bedside chair)?: Total Help needed to walk in hospital room?: Total Help needed climbing 3-5 steps with a railing? : Total 6 Click Score: 6    End of Session Equipment Utilized During Treatment: Oxygen Activity Tolerance: Patient tolerated treatment  well Patient left: in bed;with call bell/phone within reach;with bed alarm set Nurse Communication: Mobility status PT Visit Diagnosis: Hemiplegia and hemiparesis;Other abnormalities of gait and mobility (R26.89);Unsteadiness on feet (R26.81);Muscle weakness (generalized) (M62.81);Difficulty in walking, not elsewhere classified (R26.2);Other symptoms and signs involving the nervous system (R29.898);Apraxia (R48.2) Hemiplegia - Right/Left: Left Hemiplegia - dominant/non-dominant: Non-dominant Hemiplegia - caused by: Cerebral infarction;Nontraumatic SAH;Nontraumatic intracerebral hemorrhage     Time: 0906-1008 PT Time Calculation (min) (ACUTE ONLY): 62 min  Charges:  $Therapeutic Activity: 23-37 mins                     Holly Bradley PT, DPT Acute Rehabilitation Services Pager [phone] Office [phone]    Linna Hoff 06/07/2021, 12:28 PM

## 2021-06-07 NOTE — Progress Notes (Signed)
Occupational Therapy Treatment Patient Details Name: Holly Bradley MRN: [id number] DOB: 1976-08-29 Today's Date: 06/07/2021    History of present illness 45 yo female s/p mechanical thrombectomy on 5/24 of right MCA M1/M2/M3 and embolization of right MCA M3 branch due to contrast extravasation. Pt re-intubated 5/26. Repeat head CT on 5/26 showed increased cytotoxic edema and mildly increased leftward midline shift, now 5 mm, decreased SAH and IVH. Abdominal CT showing 22.9 cm mass, possibly consistent with uterine fibroids, no lymphadenopathy. s/p trach 6/3. PMH fibriods HTN HF paranoid psychosis recent hospitalizations january and february 2022   OT comments  Pt seen in conjunction with PT to attempt maximove transfer to recliner. Pt required MODA - total A +2 for bed mobility to place lift pad, dependent transfer to recliner with maximove, pt tolerated transfer well but once in recliner ~ 8 mins pt with drop in BP ( see vitals below), therefore returned pt back to bed with BP returning to functional limits. Will continue efforts to mobilize pt OOB pending stable BP. Pt would continue to benefit from skilled occupational therapy while admitted and after d/c to address the below listed limitations in order to improve overall functional mobility and facilitate independence with BADL participation. DC plan remains appropriate, will follow acutely per POC.   BR from supine- 142/85 ( 100) HR 102 SpO2 96% Sitting in recliner 124/72 ( 86) HR 103 SpO2 97% sitting in chair ~ 5 mins 109/61 (73) HR 97 SpO2 98% sitting in recliner ~ 7 mins 104/62 (74) HR 94 SpO2 98% Returned to supine 115/70 (84) HR 95 SpO2 100%.   Follow Up Recommendations  SNF    Equipment Recommendations  Wheelchair (measurements OT);Wheelchair cushion (measurements OT);Hospital bed;3 in 1 bedside commode    Recommendations for Other Services      Precautions / Restrictions Precautions Precautions: Fall Precaution Comments: L  hemiparesis, PRAFO, hand splint,  trach, SBP < 160, cortrak Other Brace: L resting hand splint( on every 4 hours/ off every 4 hours) Restrictions Weight Bearing Restrictions: No       Mobility Bed Mobility Overal bed mobility: Needs Assistance Bed Mobility: Rolling Rolling: Max assist;+2 for physical assistance;+2 for safety/equipment;Total assist         General bed mobility comments: MAX A +2 to roll to pts L side with pt able to reach across with RUE and bend RLE, total A +2 to roll to pts R side    Transfers                 General transfer comment: dependent transfer to recliner with maximove, pt with drop in BP in recliner therefore returned pt to bed    Balance                                           ADL either performed or assessed with clinical judgement   ADL Overall ADL's : Needs assistance/impaired                 Upper Body Dressing : Maximal assistance Upper Body Dressing Details (indicate cue type and reason): to don new gown from bed level Lower Body Dressing: Total assistance;Bed level Lower Body Dressing Details (indicate cue type and reason): to don<>doff foot wear   Toilet Transfer Details (indicate cue type and reason): maximoved pt to chair         Functional mobility  during ADLs: Moderate assistance;Total assistance;+2 for physical assistance (bed mobility only) General ADL Comments: session focus on completing total A transfer from bed to chair with [employer]     Vision       Perception     Praxis      Cognition Arousal/Alertness: Awake/alert;Lethargic (moments of wakefulness and moments of lethargy) Behavior During Therapy: Flat affect Overall Cognitive Status: Difficult to assess Area of Impairment: Following commands;Problem solving                       Following Commands: Follows one step commands with increased time;Follows one step commands inconsistently     Problem Solving: Slow  processing;Decreased initiation;Difficulty sequencing;Requires verbal cues;Requires tactile cues General Comments: pt continuing to nod appropriately during session but more tearful and slow to respond during session needing increased time to follow commands        Exercises     Shoulder Instructions       General Comments pt on 5LPM 28% trach collar with SpO2 >96%. BR from supine- 142/85 ( 100) HR 102 SpO2 96%, Sitting in recliner 124/72 ( 86) HR 103 SpO2 97%, sitting in chair ~ 5 mins 109/61 (73) HR 97 SpO2 98%, sitting in recliner ~ 7 mins 104/62 (74) HR 94 SpO2 98%, Returned to supine 115/70 (84) HR 95 SpO2 100%. noted green bile leaking from feeding tube, alerted RN- pt also experiencing vaginal bleeding- alerted RN    Pertinent Vitals/ Pain       Pain Assessment: Faces Faces Pain Scale: Hurts a little bit Pain Location: generalized discomfort with coughing Pain Descriptors / Indicators: Grimacing;Discomfort Pain Intervention(s): Monitored during session;Repositioned  Home Living                                          Prior Functioning/Environment              Frequency  Min 2X/week        Progress Toward Goals  OT Goals(current goals can now be found in the care plan section)  Progress towards OT goals: Progressing toward goals  Acute Rehab OT Goals OT Goal Formulation: Patient unable to participate in goal setting Time For Goal Achievement: 06/12/21 Potential to Achieve Goals: [place] Discharge plan remains appropriate;Frequency remains appropriate    Co-evaluation      Reason for Co-Treatment: Complexity of the patient's impairments (multi-system involvement);For patient/therapist safety;To address functional/ADL transfers   OT goals addressed during session: ADL's and self-care      AM-PAC OT "6 Clicks" Daily Activity     Outcome Measure   Help from another person eating meals?: Total Help from another person taking care of  personal grooming?: A Lot Help from another person toileting, which includes using toliet, bedpan, or urinal?: Total Help from another person bathing (including washing, rinsing, drying)?: Total Help from another person to put on and taking off regular upper body clothing?: Total Help from another person to put on and taking off regular lower body clothing?: Total 6 Click Score: 7    End of Session Equipment Utilized During Treatment: Oxygen;Other (comment) (5LPM 28% trach collar)  OT Visit Diagnosis: Unsteadiness on feet (R26.81);Muscle weakness (generalized) (M62.81);Hemiplegia and hemiparesis Hemiplegia - Right/Left: Left Hemiplegia - dominant/non-dominant: Non-Dominant Hemiplegia - caused by: Cerebral infarction   Activity Tolerance Treatment limited secondary to medical complications (Comment);Other (comment) (hypotension  with transfer to recliner)   Patient Left in bed;with call bell/phone within reach;with bed alarm set   Nurse Communication Mobility status        Time: 0228-4069 OT Time Calculation (min): 61 min  Charges: OT General Charges $OT Visit: 1 Visit OT Treatments $Therapeutic Activity: 23-37 mins  [name], COTA/L Acute Rehabilitation Services [place] 06/07/2021, 11:18 AM

## 2021-06-07 NOTE — Progress Notes (Signed)
[facility] PROGRESS NOTE    Progress Note  Holly Bradley  GYB:[id number] DOB: 1976/10/05 DOA: 05/15/2021 PCP: Mitzi Hansen, MD     Brief Narrative:   Holly Bradley is an 45 y.o. female past medical history of essential hypertension, diabetes mellitus type 2 large uterine fibroids paroxysmal SVT, chronic diastolic heart failure paranoid psychosis comes into the ED on 05/16/2019 with confusion was found to have a right MCA occlusion underwent thrombectomy which was complicated by contrast extravasation and M3 coiling embolization and revascularization.  She subsequently developed subarachnoid hemorrhage repeat imaging revealed right basal ganglia infarct with a 3 mm left shift, she was treated with hypertonic saline in the ICU, on 05/17/2021 she developed respiratory distress and he was intubated CT of the head showed increased cytotoxic edema and left side shift.  No mental status improvement is now trach and with nasal collar track for tube feedings.  She has been treated with Ancef and completed a 7-day course for MSSA pneumonia, her blood cultures on 05/21/2021 were positive for staph epi.  Infectious disease was consulted and are on board she has remained off antibiotics since 2022 repeated blood cultures on 06/02/2021 have remained negative.   Significant Events: 5/24 Admit to neurology after ED visit with AMS, found to have R MCA stroke, underwent mechanical thrombectomy, M3 coil embolization, subsequent SAH 5/26 decreased mental status and respiratory distress, intubated 6/1 still not candidate for extubation with poor mentation and cough mechanics. Family unsure if pt would want trach. Staph epi bacteremia, continues on ancef 6/2 moving RUE RLE following some commands. decision to move forward with tracheostomy.   6/3 perc trach. Slight WBC increase but no fever, continues on ancef 6/4 Tolerating PSV/CPAP 6/5 WBC up to 17, last day of ancef for staph pna. Trying ATC  6/9 ID  consulted for staph epi bacteremia . Felt contamination and stopped abx. 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 Stroke team on board, they relate that there was salts over hypercoagulable work-up revealed the presence of lupus, they do recommend repeating a hypercoagulable panel in 12 weeks. Her brother is now stating that he would not want a permanent G-tube General surgery has been following with the plan for open G-tube due to extensive fibroid and difficult anatomy. Currently on aspirin and Lipitor.  Acute respiratory failure with hypoxia: Currently being followed by PCCM and approved skilled assistance. Downsizing to a cuffless #6 continue routine trach care. Not a candidate for decannulation at this point. Okay to progress to [place]. Pulmonary will follow for trach care twice a week.  MSSA pneumonia: Completed 1 week course of antibiotics.  Sepsis due to staph epi there may to miss bacteremia: ID on board they recommended to hold on antibiotics as repeated blood cultures appear likely contaminant. T-max of 100.5 continues to improve.  Thrombocytosis: Mild has had no further increase they are improving.  Is an acute phase reactant.  Uterine fibroid: Extremely large palpable on exam OB/GYN was consulted they recommended to continue Megace. OB/GYN reevaluated.  They did not recommend hysterectomy at a more surgical remover due to her current medical condition.  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  Has remained relatively stable, will continue to monitor intermittently.  Nutrition: Continue tube  feedings, through core track.  History of paroxysmal SVT: Continue home amiodarone dose.  Goals of care: She has a very poor prognosis the brother has unrealistic expectations about outcomes. Will ask palliative care to continue to work with them as the patient quality of life will be poor as she will be bedbound she has no higher level function at this time communication wise.   DVT prophylaxis: scd Family Communicationbrother Status is: Inpatient  Remains inpatient appropriate because:Hemodynamically unstable  Dispo: The patient is from: Home              Anticipated d/c is to: LTAC              Patient currently is not medically stable to d/c.   Difficult to place patient No        Code Status:     Code Status Orders  (From admission, onward)           Start     Ordered   05/30/21 1101  Limited resuscitation (code)  Continuous       Question Answer Comment  In the event of cardiac or respiratory ARREST: Initiate Code Blue, Call Rapid Response Yes   In the event of cardiac or respiratory ARREST: Perform CPR No   In the event of cardiac or respiratory ARREST: Perform Intubation/Mechanical Ventilation Yes   In the event of cardiac or respiratory ARREST: Use NIPPV/BiPAp only if indicated Yes   In the event of cardiac or respiratory ARREST: Administer ACLS medications if indicated Yes   In the event of cardiac or respiratory ARREST: Perform Defibrillation or Cardioversion if indicated Yes      05/30/21 1101           Code Status History     Date Active Date Inactive Code Status Order ID Comments User Context   05/15/2021 0754 05/30/2021 1101 Full Code [id number]  Greta Doom, MD ED         IV Access:   Peripheral IV   Procedures and diagnostic studies:   No results found.   Medical Consultants:   None.   Subjective:    Holly Bradley nonverbal  Objective:    Vitals:   06/07/21 0448 06/07/21 0500 06/07/21 0731 06/07/21 0841   BP:   (!) 150/85 (!) 150/85  Pulse: 84  95 96  Resp: '17  18 17  ' Temp:   98.2 F (36.8 C)   TempSrc:   Axillary   SpO2: 98%  99%   Weight:  74.1 kg    Height:       SpO2: 99 % O2 Flow Rate (L/min): 5 L/min FiO2 (%): 21 %   Intake/Output Summary (Last 24 hours) at 06/07/2021 0947 Last data filed at 06/07/2021 0100 Gross per 24 hour  Intake 10 ml  Output 1600 ml  Net -1590 ml    Filed Weights   05/31/21 1528 06/06/21 0500 06/07/21 0500  Weight: 76.6 kg 76.6 kg 74.1 kg    Exam: General exam: In no acute distress. Respiratory system: Good air movement and clear to auscultation. Cardiovascular system: S1 & S2 heard, RRR. No JVD. Gastrointestinal system: Abdomen is distended, soft and nontender.  Extremities: No pedal edema. Skin: No rashes, lesions or ulcers  Data Reviewed:    Labs: Basic Metabolic Panel: Recent Labs  Lab 06/01/21 0255 06/02/21 0306 06/03/21 1613 06/04/21 0457 06/06/21 0329  NA 135 137  136 136 137  K 4.6 4.6 5.2* 4.5 4.8  CL 107 108 108 105 106  CO2 20* 19* 19* 22 19*  GLUCOSE 110* 125* 81 127* 113*  BUN '18 20 20 ' 21* 23*  CREATININE 1.04* 1.14* 1.09* 1.10* 1.07*  CALCIUM 9.1 9.3 9.6 9.3 9.3    GFR Estimated Creatinine Clearance: 65.5 mL/min (A) (by C-G formula based on SCr of 1.07 mg/dL (H)). Liver Function Tests: Recent Labs  Lab 06/03/21 1613 06/04/21 0457  AST 38 26  ALT 14 13  ALKPHOS 163* 144*  BILITOT 0.2* 0.4  PROT 6.4* 6.1*  ALBUMIN 2.2* 2.0*    No results for input(s): LIPASE, AMYLASE in the last 168 hours. No results for input(s): AMMONIA in the last 168 hours. Coagulation profile No results for input(s): INR, PROTIME in the last 168 hours. COVID-19 Labs  No results for input(s): DDIMER, FERRITIN, LDH, CRP in the last 72 hours.   Lab Results  Component Value Date   [place] NEGATIVE 05/15/2021    CBC: Recent Labs  Lab 06/02/21 0306 06/03/21 0903 06/04/21 0457 06/05/21 1345 06/06/21 0329  WBC 11.5*  11.5* 8.9 8.7 8.1  HGB 7.9* 8.6* 7.6* 8.5* 8.1*  HCT 27.0* 29.0* 26.2* 28.7* 27.4*  MCV 78.3* 79.2* 79.6* 79.1* 80.1  PLT 638* 693* 633* 611* 547*    Cardiac Enzymes: No results for input(s): CKTOTAL, CKMB, CKMBINDEX, TROPONINI in the last 168 hours. BNP (last 3 results) No results for input(s): PROBNP in the last 8760 hours. CBG: Recent Labs  Lab 06/06/21 1644 06/06/21 1951 06/06/21 2323 06/07/21 0340 06/07/21 0741  GLUCAP 87 116* 106* 107* 109*    D-Dimer: No results for input(s): DDIMER in the last 72 hours.  Hgb A1c: No results for input(s): HGBA1C in the last 72 hours. Lipid Profile: No results for input(s): CHOL, HDL, LDLCALC, TRIG, CHOLHDL, LDLDIRECT in the last 72 hours. Thyroid function studies: No results for input(s): TSH, T4TOTAL, T3FREE, THYROIDAB in the last 72 hours.  Invalid input(s): FREET3 Anemia work up: No results for input(s): VITAMINB12, FOLATE, FERRITIN, TIBC, IRON, RETICCTPCT in the last 72 hours. Sepsis Labs: Recent Labs  Lab 05/31/21 1114 06/01/21 0255 06/03/21 0903 06/04/21 0457 06/05/21 1345 06/06/21 0329  WBC  --    < > 11.5* 8.9 8.7 8.1  LATICACIDVEN 0.7  --   --   --   --   --    < > = values in this interval not displayed.    Microbiology Recent Results (from the past 240 hour(s))  Culture, blood (Routine X 2) w Reflex to ID Panel     Status: None   Collection Time: 06/02/21  2:17 PM   Specimen: BLOOD LEFT HAND  Result Value Ref Range Status   Specimen Description BLOOD LEFT HAND  Final   Special Requests   Final    BOTTLES DRAWN AEROBIC AND ANAEROBIC Blood Culture adequate volume   Culture   Final    NO GROWTH 5 DAYS Performed at [facility], [place] [address], [place], [place] [address]    Report Status 06/07/2021 FINAL  Final  Culture, blood (Routine X 2) w Reflex to ID Panel     Status: None   Collection Time: 06/02/21  2:22 PM   Specimen: BLOOD  Result Value Ref Range Status   Specimen Description BLOOD LEFT  ANTECUBITAL  Final   Special Requests   Final    BOTTLES DRAWN AEROBIC AND ANAEROBIC Blood Culture adequate volume  Culture   Final    NO GROWTH 5 DAYS Performed at [facility], [place] [address], [place], [place] [address]    Report Status 06/07/2021 FINAL  Final     Medications:    amiodarone  200 mg Per Tube Daily   amLODipine  10 mg Per Tube Daily   aspirin  81 mg Per Tube Daily   atorvastatin  40 mg Per Tube Daily   carvedilol  25 mg Per Tube BID WC   chlorhexidine gluconate (MEDLINE KIT)  15 mL Mouth Rinse BID   Chlorhexidine Gluconate Cloth  6 each Topical Daily   cloNIDine  0.1 mg Per Tube TID   feeding supplement (PROSource TF)  45 mL Per Tube BID   ferrous sulfate  300 mg Per Tube BID WC   free water  200 mL Per Tube Q6H   hydrALAZINE  100 mg Per Tube Q8H   isosorbide dinitrate  40 mg Per Tube TID   mouth rinse  15 mL Mouth Rinse 10 times per day   megestrol  80 mg Per Tube TID   multivitamin with minerals  1 tablet Per Tube Daily   pantoprazole sodium  40 mg Per Tube Daily   sacubitril-valsartan  1 tablet Per Tube BID   sodium chloride flush  10-40 mL Intracatheter Q12H   Continuous Infusions:  feeding supplement (JEVITY 1.5 CAL/FIBER) 1,000 mL (06/06/21 1120)      LOS: 23 days   Charlynne Cousins  [facility]  06/07/2021, 9:47 AM

## 2021-06-07 NOTE — Progress Notes (Signed)
Gynecology Consult Progress Note  Admission Date: 05/15/2021 Current Date: 02/20/6009 93:23 PM  Holly Bradley is a 45 y.o. G0 with PMHx significant for significant for HTN, known paroxysmal SVT, poor patient compliance, known fibroid uterus, heart failure, tobacco abuse.  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   I was called by her primary team due to patient with VB and passage of two clots per RN.   History complicated by: Patient Active Problem List   Diagnosis Date Noted   Status post tracheostomy ([place])    Vaginal bleeding    Abdominal distention    Encounter for feeding tube placement    Encounter for intubation    Ileus ([place])    Staphylococcus epidermidis bacteremia    Fibroids 05/25/2021   Iron deficiency anemia due to chronic blood loss 05/25/2021   Cerebral edema ([place]) 05/25/2021   Chronic HFrEF (heart failure with reduced ejection fraction) ([place]) 05/25/2021   Pneumonia due to methicillin susceptible Staphylococcus aureus ([place]) 05/25/2021   Acute respiratory failure with hypoxia ([place])    Cerebrovascular accident (CVA) ([place]) 05/15/2021    ROS and patient/family/surgical history, located on admission H&P note dated 05/15/2021, have been reviewed, and there are no changes except as noted below Overnight Events:  Purewick pads got thrown out by accident, per RN but only had scant if any blood on them  Subjective:  Patient resting comfortably  Objective:    Current Vital Signs 24h Vital Sign Ranges  T 98 F (36.7 C) Temp  Avg: 98.7 F (37.1 C)  Min: 98 F (36.7 C)  Max: 99.5 F (37.5 C)  BP 134/88 BP  Min: 121/72  Max: 162/77  HR 79 Pulse  Avg: 90.4  Min: 79  Max: 111  RR 17 Resp  Avg: 17.4  Min: 9  Max: 21  SaO2 100 % Tracheostomy Collar SpO2  Avg: 99 %  Min: 98 %  Max: 100 %       24 Hour I/O Current Shift I/O  Time Ins Outs 06/15 0701 - 06/16 0700 In: 10 [I.V.:10] Out: 1600 [Urine:1600] No intake/output data recorded.    Physical exam: General appearance: NAD Abdomen:  soft, nttp, nd GU: very light, scant streak of old blood/discharge on the purewick which has been in place all morning Pulmonary: no respiratory distress  Medications Current Facility-Administered Medications  Medication Dose Route Frequency Provider Last Rate Last Admin   acetaminophen (TYLENOL) tablet 650 mg  650 mg Per Tube Q4H PRN Llana Aliment, RPH       Or   acetaminophen (TYLENOL) 160 MG/5ML solution 650 mg  650 mg Per Tube Q4H PRN Llana Aliment, RPH   650 mg at 06/04/21 1313   Or   acetaminophen (TYLENOL) suppository 650 mg  650 mg Rectal Q4H PRN Llana Aliment, RPH       amiodarone (PACERONE) tablet 200 mg  200 mg Per Tube Daily Greta Doom, MD   200 mg at 06/07/21 0905   amLODipine (NORVASC) tablet 10 mg  10 mg Per Tube Daily Debbe Odea, MD   10 mg at 06/07/21 5573   aspirin chewable tablet 81 mg  81 mg Per Tube Daily Mick Sell, PA-C   81 mg at 06/07/21 0906   atorvastatin (LIPITOR) tablet 40 mg  40 mg Per Tube Daily Oda Kilts, Mahmoud, MD   40 mg at 06/07/21 0906   carvedilol (COREG) tablet 25 mg  25 mg Per Tube  BID WC Jacky Kindle, MD   25 mg at 06/07/21 0905   chlorhexidine gluconate (MEDLINE KIT) (PERIDEX) 0.12 % solution 15 mL  15 mL Mouth Rinse BID Frederik Pear, MD   15 mL at 06/07/21 0908   Chlorhexidine Gluconate Cloth 2 % PADS 6 each  6 each Topical Daily Greta Doom, MD   6 each at 06/07/21 0908   cloNIDine (CATAPRES) tablet 0.1 mg  0.1 mg Per Tube TID Debbe Odea, MD   0.1 mg at 06/07/21 0906   feeding supplement (JEVITY 1.5 CAL/FIBER) liquid 1,000 mL  1,000 mL Per Tube Continuous Debbe Odea, MD 50 mL/hr at 06/06/21 1120 1,000 mL at 06/06/21 1120   feeding supplement (PROSource TF) liquid 45 mL  45 mL Per Tube BID Garvin Fila, MD   45 mL at 06/07/21 0906   ferrous sulfate 300 (60 Fe) MG/5ML syrup 300 mg  300 mg Per Tube BID WC Debbe Odea, MD   300 mg at 06/07/21 0906    free water 200 mL  200 mL Per Tube Q6H Chand, Sudham, MD   200 mL at 06/07/21 1200   hydrALAZINE (APRESOLINE) tablet 100 mg  100 mg Per Tube Lina Sar, MD   100 mg at 06/07/21 0602   isosorbide dinitrate (ISORDIL) tablet 40 mg  40 mg Per Tube TID Jacky Kindle, MD   40 mg at 06/07/21 9798   loperamide HCl (IMODIUM) 1 MG/7.5ML suspension 2 mg  2 mg Per Tube PRN Cristal Generous, NP   2 mg at 05/31/21 1800   MEDLINE mouth rinse  15 mL Mouth Rinse 10 times per day Frederik Pear, MD   15 mL at 06/07/21 1200   megestrol (MEGACE) tablet 80 mg  80 mg Per Tube TID Llana Aliment, RPH   80 mg at 06/07/21 9211   multivitamin with minerals tablet 1 tablet  1 tablet Per Tube Daily Audria Nine, DO   1 tablet at 06/07/21 0906   pantoprazole sodium (PROTONIX) 40 mg/20 mL oral suspension 40 mg  40 mg Per Tube Daily Greta Doom, MD   40 mg at 06/07/21 0909   sacubitril-valsartan (ENTRESTO) 97-103 mg per tablet  1 tablet Per Tube BID Kipp Brood, MD   1 tablet at 06/07/21 0906   simethicone (MYLICON) 40 HE/1.7EY suspension 40 mg  40 mg Per Tube Q6H PRN Bowser, Laurel Dimmer, NP       sodium chloride flush (NS) 0.9 % injection 10-40 mL  10-40 mL Intracatheter Q12H Rizwan, Saima, MD   10 mL at 06/07/21 0915   sodium chloride flush (NS) 0.9 % injection 10-40 mL  10-40 mL Intracatheter PRN Debbe Odea, MD          Labs  Recent Labs  Lab 06/05/21 1345 06/06/21 0329 06/07/21 1050  WBC 8.7 8.1 9.0  HGB 8.5* 8.1* 8.9*  HCT 28.7* 27.4* 30.2*  PLT 611* 547* 531*    Radiology No new imaging  Assessment & Plan:  Patient stable I recommend continuing on the Megace at the current dosing indefinitely. VB/discharge is scant and her H/H is stable and she doesn't have any signs of abdominal pain from the fibroids.    I don't recommend any surgical intervention, from a GYN perspective, and I would be reluctant to have IR intervene given she is still continuing to improve from her stroke, but  patient's brother desired IR to weigh, so it's reasonable to consult them to see what  their thoughts are; I've requested the primary team contact them for a consult  GYN will sign off. Please contact with any questions or issues. Also, please let us know when patient is for discharge so we can arrange outpatient GYN follow up for her.   Code Status: Partial Code  Total time taking care of the patient was 20 minutes, with greater than 50% of the time spent in face to face interaction with the patient.  Durene Romans MD Attending [facility] [place] (Faculty [facility]) GYN Consult Phone: [phone] (M-F, 0800-1700) & [phone] (Off hours, weekends, holidays)

## 2021-06-07 NOTE — Progress Notes (Signed)
SLP Cancellation Note  Patient Details Name: Holly Bradley MRN: [id number] DOB: [birth year]   Cancelled treatment:       Reason Eval/Treat Not Completed: Other (comment) (awaiting trach change to cuffless trach to reassess PMSV tolerance) will continue to closely follow   [place] MA, CCC-SLP 06/07/2021, 3:01 PM

## 2021-06-08 DIAGNOSIS — R627 Adult failure to thrive: Secondary | ICD-10-CM

## 2021-06-08 DIAGNOSIS — R1310 Dysphagia, unspecified: Secondary | ICD-10-CM

## 2021-06-08 DIAGNOSIS — Z7189 Other specified counseling: Secondary | ICD-10-CM | POA: Diagnosis not present

## 2021-06-08 DIAGNOSIS — J9601 Acute respiratory failure with hypoxia: Secondary | ICD-10-CM | POA: Diagnosis not present

## 2021-06-08 DIAGNOSIS — I63411 Cerebral infarction due to embolism of right middle cerebral artery: Secondary | ICD-10-CM | POA: Diagnosis not present

## 2021-06-08 DIAGNOSIS — Z515 Encounter for palliative care: Secondary | ICD-10-CM | POA: Diagnosis not present

## 2021-06-08 DIAGNOSIS — Z930 Tracheostomy status: Secondary | ICD-10-CM | POA: Diagnosis not present

## 2021-06-08 LAB — GLUCOSE, CAPILLARY
Glucose-Capillary: 117 mg/dL — ABNORMAL HIGH (ref 70–99)
Glucose-Capillary: 113 mg/dL — ABNORMAL HIGH (ref 70–99)
Glucose-Capillary: 126 mg/dL — ABNORMAL HIGH (ref 70–99)
Glucose-Capillary: 102 mg/dL — ABNORMAL HIGH (ref 70–99)
Glucose-Capillary: 123 mg/dL — ABNORMAL HIGH (ref 70–99)
Glucose-Capillary: 122 mg/dL — ABNORMAL HIGH (ref 70–99)

## 2021-06-08 NOTE — Progress Notes (Signed)
[facility] PROGRESS NOTE    Progress Note  Holly Bradley  WUJ:[id number] DOB: 07-02-1976 DOA: 05/15/2021 PCP: Mitzi Hansen, MD     Brief Narrative:   Holly Bradley is an 45 y.o. female past medical history of essential hypertension, diabetes mellitus type 2 large uterine fibroids paroxysmal SVT, chronic diastolic heart failure paranoid psychosis comes into the ED on 05/16/2019 with confusion was found to have a right MCA occlusion underwent thrombectomy which was complicated by contrast extravasation and M3 coiling embolization and revascularization.  She subsequently developed subarachnoid hemorrhage repeat imaging revealed right basal ganglia infarct with a 3 mm left shift, she was treated with hypertonic saline in the ICU, on 05/17/2021 she developed respiratory distress and he was intubated CT of the head showed increased cytotoxic edema and left side shift.  No mental status improvement is now trach and with nasal collar track for tube feedings.  She has been treated with Ancef and completed a 7-day course for MSSA pneumonia, her blood cultures on 05/21/2021 were positive for staph epi.  Infectious disease was consulted and are on board she has remained off antibiotics since 2022 repeated blood cultures on 06/02/2021 have remained negative.   Significant Events: 5/24 Admit to neurology after ED visit with AMS, found to have R MCA stroke, underwent mechanical thrombectomy, M3 coil embolization, subsequent SAH 5/26 decreased mental status and respiratory distress, intubated 6/1 still not candidate for extubation with poor mentation and cough mechanics. Family unsure if pt would want trach. Staph epi bacteremia, continues on ancef 6/2 moving RUE RLE following some commands. decision to move forward with tracheostomy.   6/3 perc trach. Slight WBC increase but no fever, continues on ancef 6/4 Tolerating PSV/CPAP 6/5 WBC up to 17, last day of ancef for staph pna. Trying ATC  6/9 ID  consulted for staph epi bacteremia . Felt contamination and stopped abx. 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 Stroke team on board, they relate that there was salts over hypercoagulable work-up revealed the presence of lupus, they do recommend repeating a hypercoagulable panel in 12 weeks. I have spoken to the brother he relates, he was given discussed with his father whether they wanted G-tube placed. Surgery is on board and awaiting decision. Continue aspirin and Lipitor. Anatomy had extensive fibroid it will be hard for IR to place the tube for feeding.  Acute respiratory failure with hypoxia: Currently being followed by PCCM and approved skilled assistance. Downsizing to a cuffless #6 continue routine trach care. Not a candidate for decannulation at this point. Okay to progress to [place]. Pulmonary will follow for trach care twice a week.  MSSA pneumonia: Completed 1 week course of antibiotics.  Sepsis due to staph epi there may to miss bacteremia: ID on board they recommended to hold on antibiotics as repeated blood cultures appear likely contaminant. Blood cultures have remained negative till date she has remained afebrile with no leukocytosis.  Thrombocytosis: Acting as an acute phase reactant platelets are improving.  Uterine fibroid: Extremely large palpable on exam OB/GYN was consulted they recommended to continue Megace. OB/GYN reevaluated.  They did not recommend hysterectomy at a more surgical remover due to her current medical  condition.  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  Has remained  relatively stable, will continue to monitor intermittently.  Nutrition: Continue tube feedings, through core track.  History of paroxysmal SVT: Continue home amiodarone dose.  Goals of care: She has a very poor prognosis the brother has unrealistic expectations about outcomes. Will ask palliative care to continue to work with them as the patient quality of life will be poor as she will be bedbound she has no higher level function at this time communication wise.   DVT prophylaxis: scd Family Communicationbrother Status is: Inpatient  Remains inpatient appropriate because:Hemodynamically unstable  Dispo: The patient is from: Home              Anticipated d/c is to: LTAC              Patient currently is not medically stable to d/c.   Difficult to place patient No        Code Status:     Code Status Orders  (From admission, onward)           Start     Ordered   05/30/21 1101  Limited resuscitation (code)  Continuous       Question Answer Comment  In the event of cardiac or respiratory ARREST: Initiate Code Blue, Call Rapid Response Yes   In the event of cardiac or respiratory ARREST: Perform CPR No   In the event of cardiac or respiratory ARREST: Perform Intubation/Mechanical Ventilation Yes   In the event of cardiac or respiratory ARREST: Use NIPPV/BiPAp only if indicated Yes   In the event of cardiac or respiratory ARREST: Administer ACLS medications if indicated Yes   In the event of cardiac or respiratory ARREST: Perform Defibrillation or Cardioversion if indicated Yes      05/30/21 1101           Code Status History     Date Active Date Inactive Code Status Order ID Comments User Context   05/15/2021 0754 05/30/2021 1101 Full Code [id number]  Greta Doom, MD ED         IV Access:   Peripheral IV   Procedures and diagnostic studies:   No results found.   Medical Consultants:   None.   Subjective:    Holly Bradley  nonverbal  Objective:    Vitals:   06/08/21 0428 06/08/21 0535 06/08/21 0724 06/08/21 0852  BP:   (!) 152/74   Pulse:  87 85 97  Resp:  '19 15 19  ' Temp:   98.5 F (36.9 C)   TempSrc:   Axillary   SpO2:  100% 100% 100%  Weight: 71.5 kg     Height:       SpO2: 100 % O2 Flow Rate (L/min): 5 L/min FiO2 (%): 21 %   Intake/Output Summary (Last 24 hours) at 06/08/2021 1016 Last data filed at 06/08/2021 0346 Gross per 24 hour  Intake --  Output 2300 ml  Net -2300 ml    Filed Weights   06/06/21 0500 06/07/21 0500 06/08/21 0428  Weight: 76.6 kg 74.1 kg 71.5 kg    Exam: General exam: In no acute distress. Respiratory system: Good air movement and clear to auscultation. Cardiovascular system: S1 & S2 heard, RRR. No JVD. Gastrointestinal system: Abdomen is nondistended, soft and nontender.  Central nervous system: Only able to follow simple commands complete left hemiparesis Extremities: No pedal edema. Skin: No rashes, lesions or ulcers  Data Reviewed:  Labs: Basic Metabolic Panel: Recent Labs  Lab 06/02/21 0306 06/03/21 1613 06/04/21 0457 06/06/21 0329  NA 137 136 136 137  K 4.6 5.2* 4.5 4.8  CL 108 108 105 106  CO2 19* 19* 22 19*  GLUCOSE 125* 81 127* 113*  BUN 20 20 21* 23*  CREATININE 1.14* 1.09* 1.10* 1.07*  CALCIUM 9.3 9.6 9.3 9.3    GFR Estimated Creatinine Clearance: 64.4 mL/min (A) (by C-G formula based on SCr of 1.07 mg/dL (H)). Liver Function Tests: Recent Labs  Lab 06/03/21 1613 06/04/21 0457  AST 38 26  ALT 14 13  ALKPHOS 163* 144*  BILITOT 0.2* 0.4  PROT 6.4* 6.1*  ALBUMIN 2.2* 2.0*    No results for input(s): LIPASE, AMYLASE in the last 168 hours. No results for input(s): AMMONIA in the last 168 hours. Coagulation profile No results for input(s): INR, PROTIME in the last 168 hours. COVID-19 Labs  No results for input(s): DDIMER, FERRITIN, LDH, CRP in the last 72 hours.   Lab Results  Component Value Date   [place]  NEGATIVE 05/15/2021    CBC: Recent Labs  Lab 06/03/21 0903 06/04/21 0457 06/05/21 1345 06/06/21 0329 06/07/21 1050  WBC 11.5* 8.9 8.7 8.1 9.0  HGB 8.6* 7.6* 8.5* 8.1* 8.9*  HCT 29.0* 26.2* 28.7* 27.4* 30.2*  MCV 79.2* 79.6* 79.1* 80.1 81.2  PLT 693* 633* 611* 547* 531*    Cardiac Enzymes: No results for input(s): CKTOTAL, CKMB, CKMBINDEX, TROPONINI in the last 168 hours. BNP (last 3 results) No results for input(s): PROBNP in the last 8760 hours. CBG: Recent Labs  Lab 06/07/21 1547 06/07/21 2105 06/07/21 2319 06/08/21 0349 06/08/21 0725  GLUCAP 91 94 101* 117* 113*    D-Dimer: No results for input(s): DDIMER in the last 72 hours.  Hgb A1c: No results for input(s): HGBA1C in the last 72 hours. Lipid Profile: No results for input(s): CHOL, HDL, LDLCALC, TRIG, CHOLHDL, LDLDIRECT in the last 72 hours. Thyroid function studies: No results for input(s): TSH, T4TOTAL, T3FREE, THYROIDAB in the last 72 hours.  Invalid input(s): FREET3 Anemia work up: No results for input(s): VITAMINB12, FOLATE, FERRITIN, TIBC, IRON, RETICCTPCT in the last 72 hours. Sepsis Labs: Recent Labs  Lab 06/04/21 0457 06/05/21 1345 06/06/21 0329 06/07/21 1050  WBC 8.9 8.7 8.1 9.0    Microbiology Recent Results (from the past 240 hour(s))  Culture, blood (Routine X 2) w Reflex to ID Panel     Status: None   Collection Time: 06/02/21  2:17 PM   Specimen: BLOOD LEFT HAND  Result Value Ref Range Status   Specimen Description BLOOD LEFT HAND  Final   Special Requests   Final    BOTTLES DRAWN AEROBIC AND ANAEROBIC Blood Culture adequate volume   Culture   Final    NO GROWTH 5 DAYS Performed at [facility], [place] [address] [place], [place] [address]    Report Status 06/07/2021 FINAL  Final  Culture, blood (Routine X 2) w Reflex to ID Panel     Status: None   Collection Time: 06/02/21  2:22 PM   Specimen: BLOOD  Result Value Ref Range Status   Specimen Description BLOOD LEFT  ANTECUBITAL  Final   Special Requests   Final    BOTTLES DRAWN AEROBIC AND ANAEROBIC Blood Culture adequate volume   Culture   Final    NO GROWTH 5 DAYS Performed at [facility], [place] [address], [place], [place] [address]    Report  Status 06/07/2021 FINAL  Final     Medications:    amiodarone  200 mg Per Tube Daily   amLODipine  10 mg Per Tube Daily   aspirin  81 mg Per Tube Daily   atorvastatin  40 mg Per Tube Daily   carvedilol  25 mg Per Tube BID WC   chlorhexidine gluconate (MEDLINE KIT)  15 mL Mouth Rinse BID   Chlorhexidine Gluconate Cloth  6 each Topical Daily   cloNIDine  0.1 mg Per Tube TID   feeding supplement (PROSource TF)  45 mL Per Tube BID   ferrous sulfate  300 mg Per Tube BID WC   free water  200 mL Per Tube Q6H   hydrALAZINE  100 mg Per Tube Q8H   isosorbide dinitrate  40 mg Per Tube TID   mouth rinse  15 mL Mouth Rinse 10 times per day   megestrol  80 mg Per Tube TID   multivitamin with minerals  1 tablet Per Tube Daily   pantoprazole sodium  40 mg Per Tube Daily   sacubitril-valsartan  1 tablet Per Tube BID   sodium chloride flush  10-40 mL Intracatheter Q12H   Continuous Infusions:  feeding supplement (JEVITY 1.5 CAL/FIBER) 1,000 mL (06/06/21 1120)      LOS: 24 days   Charlynne Cousins  [facility]  06/08/2021, 10:16 AM

## 2021-06-08 NOTE — Progress Notes (Addendum)
Speech Language Pathology Treatment: Dysphagia;Cognitive-Linquistic;Passy Muir Speaking valve  Patient Details Name: Holly Bradley MRN: [id number] DOB: 06-28-1976 Today's Date: 06/08/2021 Time: 8299-3716 SLP Time Calculation (min) (ACUTE ONLY): 27 min  Assessment / Plan / Recommendation Clinical Impression  PMV Pt's trach tube has been changed to a [place] 6 cuffless track. Secretions discharging at level of trach tube on SLP arrival.  Cleaned prior to [employer] placement. Pt was able to tolerate valve placement for 22 minutes today with no change in vital signs.  Pt was able to redirect airflow through upper airway.  Pt was observed to cough with PO intake with strong cough, but with no oral expectorations of secretions/bolus. Pt was able to achieve minimal phonation - low volume humming with exhale.  Pt did not make any attempts at verbal communication despite maximum cuing and encouragement from SLP. SLP removed PMV prior to departure and placed in case on bedside table.  Pt coughed and expectorated secretions at tracheostomy on removal of PMV. Pt may wear PMV with direct supervision with staff and during therapies.  Please remove PMV when pt is unsupervised or asleep.   SWALLOWING SLP provided oral care prior to administration of PO trials.  PMV was in place during PO trials.  With ice chips x3 there was prompt oral response and palpable swallow response.  Pt exhibited strong cough following each trial. Pt declined further trials. Pt is not yet ready for further evaluation of swallow function, but will likely need instrumental evaluation prior to initiation of PO diet.  It is my hope that being able to wear PMV will help to improve swallow function.  COGNITION AND COMMUNICATION Pt did not attempt to communicate verbally during this session despite placement of PMV, but was able to obtain minimal spontaneous phonation.  Pt did not verbalize despite maximum encouragement and cuing (verbal, visual,  tactile, melodic) from SLP.  Pt's L neglect remains severe.  Pt was able to turn head to R with verbal and tactile cuing and could track eyes to near midline.  With receptive language tasks pt demonstrated ability to follow 1, 2 and 3 step directions with 100% accuracy.  Pt was able to nod and shake head appropriately to questions in conversation, although Y/N accuracy was not formally assessed.  With object identification, pt was able to identify objects from field of 2 when object was placed on R.  When object was on L side she did not respond as it is presumed that she did not see it. SLP stood on R side of pt and presented both objects on her R for this task.  L neglect may remain a barrier to assessment of language and other cognitive competencies.    HPI HPI: Pt is a [name] who presents with AMS and slurred speech after being found down. MRI showing moderately large acute right MCA infarct with associated petechial hemorrhage, SAH, and minimal IVH. S/p mechanical thrombectomy of M1/M2/M3 and embolization of right MCA M3 branch. Significant PMH: uncontrolled HTN, tobacco use disorder, HF, paroxysmal SVT, paranoid psychosis. Required intubation s/p trach 6/3, changed to cuffless 6/16. Pt pending PEG however large uterine mass disrupting planned placement, and goals of care are unclear at this time      SLP Plan  Continue with current plan of care       Recommendations  Diet recommendations: NPO      Patient may use Passy-Muir Speech Valve: Intermittently with supervision;During all therapies with supervision PMSV Supervision: Full  General recommendations: Rehab consult Oral Care Recommendations: Oral care QID Follow up Recommendations: Inpatient Rehab;Skilled Nursing facility SLP Visit Diagnosis: Dysphagia, unspecified (R13.10);Aphonia (R49.1);Cognitive communication deficit (R41.841) Plan: Continue with current plan of care       [place],  [place], [place] Office: [phone]  06/08/2021, 10:15 AM

## 2021-06-08 NOTE — Progress Notes (Signed)
CPT held. Patient sleeping.

## 2021-06-08 NOTE — Progress Notes (Signed)
Daily Progress Note   [name]       Date: 06/08/2021 DOB: [birth year]  Age: 45 y.o. MRN#: [id number] Attending Physician: Charlynne Cousins, MD Primary Care Physician: Mitzi Hansen, MD Admit Date: 05/15/2021  Reason for Consultation/Follow-up: Establishing goals of care  Subjective: Shrugs R shoulder when I ask her how she feels, shakes head "no" when I ask if she feels worse, shakes head no when I ask if she is better, nods "yes" when I ask about the same. When I ask about pain she shakes head "no". She does jerk her R arm and forcefully place it down on bed seeming to indicate some frustration/agitation with situation. I explain the next step of feeding tube placement in her abdomen and she nods "yes".  Length of Stay: 24  Current Medications: Scheduled Meds:  . amiodarone  200 mg Per Tube Daily  . amLODipine  10 mg Per Tube Daily  . aspirin  81 mg Per Tube Daily  . atorvastatin  40 mg Per Tube Daily  . carvedilol  25 mg Per Tube BID WC  . chlorhexidine gluconate (MEDLINE KIT)  15 mL Mouth Rinse BID  . Chlorhexidine Gluconate Cloth  6 each Topical Daily  . cloNIDine  0.1 mg Per Tube TID  . feeding supplement (PROSource TF)  45 mL Per Tube BID  . ferrous sulfate  300 mg Per Tube BID WC  . free water  200 mL Per Tube Q6H  . hydrALAZINE  100 mg Per Tube Q8H  . isosorbide dinitrate  40 mg Per Tube TID  . mouth rinse  15 mL Mouth Rinse 10 times per day  . megestrol  80 mg Per Tube TID  . multivitamin with minerals  1 tablet Per Tube Daily  . pantoprazole sodium  40 mg Per Tube Daily  . sacubitril-valsartan  1 tablet Per Tube BID  . sodium chloride flush  10-40 mL Intracatheter Q12H    Continuous Infusions: . feeding supplement (JEVITY 1.5 CAL/FIBER) 1,000 mL  (06/06/21 1120)    PRN Meds: acetaminophen **OR** acetaminophen (TYLENOL) oral liquid 160 mg/5 mL **OR** acetaminophen, loperamide HCl, simethicone, sodium chloride flush  Physical Exam Constitutional:      General: She is not in acute distress.    Comments: Opens eyes when I speak to her, nods head appropriately. Falls asleep quickly after conversation - lethargic  Cardiovascular:     Rate and Rhythm: Normal rate and regular rhythm.  Pulmonary:     Effort: Pulmonary effort is normal.  Abdominal:     Palpations: There is mass.  Skin:    General: Skin is warm and dry.  Psychiatric:     Comments: A bit agitated when awake            Vital Signs: BP (!) 152/74 (BP Location: Left Arm)   Pulse 97   Temp 98.5 F (36.9 C) (Axillary)   Resp 19   Ht '5\' 5"'  (1.651 m)   Wt 71.5 kg   LMP  (LMP Unknown) Comment: pt not alert  SpO2 100%   BMI 26.23 kg/m  SpO2: SpO2: 100 % O2 Device: O2 Device: Tracheostomy Collar O2 Flow Rate: O2 Flow  Rate (L/min): 5 L/min  Intake/output summary:   Intake/Output Summary (Last 24 hours) at 06/08/2021 1052 Last data filed at 06/08/2021 0346 Gross per 24 hour  Intake --  Output 2300 ml  Net -2300 ml    LBM: Last BM Date: 06/07/21 Baseline Weight: Weight: 72.6 kg Most recent weight: Weight: 71.5 kg      Flowsheet Rows    Flowsheet Row Most Recent Value  Intake Tab   Referral Department Critical care  Unit at Time of Referral ICU  Palliative Care Primary Diagnosis Neurology  Date Notified 05/22/21  Palliative Care Type New Palliative care  Reason for referral Clarify Goals of Care  Date of Admission 05/15/21  Date first seen by Palliative Care 05/23/21  # of days Palliative referral response time 1 Day(s)  # of days IP prior to Palliative referral 7  Clinical Assessment   Psychosocial & Spiritual Assessment   Palliative Care Outcomes        Patient Active Problem List   Diagnosis Date Noted  . Status post tracheostomy ([place])   .  Abnormal uterine bleeding (AUB)   . Abdominal distention   . Encounter for feeding tube placement   . Encounter for intubation   . Ileus ([place])   . Staphylococcus epidermidis bacteremia   . Fibroid 05/25/2021  . Iron deficiency anemia due to chronic blood loss 05/25/2021  . Cerebral edema ([place]) 05/25/2021  . Chronic HFrEF (heart failure with reduced ejection fraction) ([place]) 05/25/2021  . Pneumonia due to methicillin susceptible Staphylococcus aureus ([place]) 05/25/2021  . Acute respiratory failure with hypoxia ([place])   . Cerebrovascular accident (CVA) ([place]) 05/15/2021    Palliative Care Assessment & Plan   HPI: 45 y.o. female  with past medical history of uncontrolled HTN, tobacco use disorder, uterine fibroids, heart failure, paroxysmal SVT, paranoid psychosis admitted on 05/15/2021 with AMS. She was diagnosed with acute large MCA stroke with subarachnoid hemorrhage and midline shift requiring mechanical ventilation for airway protection and hyperosmolar therapy for cerebral edema. PMT consulted to discuss [place]. Holly Bradley was placed 6/3.   Assessment: Holly Bradley remains interactive with me. Nods yes and shakes head no appropriately to questions. No family at bedside. Again, I explain her need for feeding tube placed in abdomen since her current tube is temporary - she nods yes. During our brief conversation she closes her eyes and turns away from me ending conversation.  I call her brother [name] - he expresses lots of frustration about current situation. He tells me he feels like he is receiving differing information from providers and nurses. He tells me when he has been at bedside Holly Bradley is very interactive with him - communicating with him in sign language and spelling words with her R hand.   He again shares his concerns about Holly Bradley's quality of life - he does not want her to live in a facility but understands this is a likely outcome. He remains hopeful for improvement in her function as she works  with therapy.  We discuss g tube placement again, [name] tells me he has spent lots of time thinking about this and discussing with other family members - he tells me he will agree to move forward with g tube placement. We discuss that this is the next logical step in Holly Bradley's care if he would like to continue on a full scope treatment path - he confirms that he does.  [name] shares frustration about medicaid and disability application. Emotional support provided.  [name] has my number  and knows to call with any further questions or concerns.   Recommendations/Plan: Brother agreed over phone with me to move forward with g tube placement Emotional support provided to brother - he has lots of concerns and frustrations about situation  Goals of Care and Additional Recommendations: Limitations on Scope of Treatment: Full Scope Treatment  Code Status: Limited code - NO CPR  Prognosis:  Unable to determine  Discharge Planning: To Be Determined  Care plan was discussed with patient's brother  Thank you for allowing the Palliative Medicine Team to assist in the care of this patient.   Total Time 40 minutes Prolonged Time Billed  no    Greater than 50%  of this time was spent counseling and coordinating care related to the above assessment and plan.  Juel Burrow, DNP, [facility] Palliative Medicine Team Team Phone # [phone]  Pager [phone]

## 2021-06-09 DIAGNOSIS — J9601 Acute respiratory failure with hypoxia: Secondary | ICD-10-CM | POA: Diagnosis not present

## 2021-06-09 DIAGNOSIS — I63411 Cerebral infarction due to embolism of right middle cerebral artery: Secondary | ICD-10-CM | POA: Diagnosis not present

## 2021-06-09 DIAGNOSIS — R1310 Dysphagia, unspecified: Secondary | ICD-10-CM | POA: Diagnosis not present

## 2021-06-09 DIAGNOSIS — Z930 Tracheostomy status: Secondary | ICD-10-CM | POA: Diagnosis not present

## 2021-06-09 LAB — GLUCOSE, CAPILLARY
Glucose-Capillary: 101 mg/dL — ABNORMAL HIGH (ref 70–99)
Glucose-Capillary: 132 mg/dL — ABNORMAL HIGH (ref 70–99)
Glucose-Capillary: 107 mg/dL — ABNORMAL HIGH (ref 70–99)
Glucose-Capillary: 104 mg/dL — ABNORMAL HIGH (ref 70–99)

## 2021-06-09 NOTE — Progress Notes (Signed)
Occupational Therapy Treatment Patient Details Name: Holly Bradley MRN: [id number] DOB: [birth year] Today's Date: 06/09/2021    History of present illness 45 yo female s/p mechanical thrombectomy on 5/24 of right MCA M1/M2/M3 and embolization of right MCA M3 branch due to contrast extravasation. Pt re-intubated 5/26. Repeat head CT on 5/26 showed increased cytotoxic edema and mildly increased leftward midline shift, now 5 mm, decreased SAH and IVH. Abdominal CT showing 22.9 cm mass, possibly consistent with uterine fibroids, no lymphadenopathy. s/p trach 6/3. PMH fibriods HTN HF paranoid psychosis recent hospitalizations january and february 2022   OT comments  Splint checked this date, with PROM to L hand, wrist and forearm as described below.  Patient repositioned to opposite side and pillows used for assist with positioning.  Goals reviewed, and updated as needed.  OT to continue to follow in the acute setting to maximize function, but given heavy 24 hour care, SNF is recommended post acute.    Follow Up Recommendations  SNF    Equipment Recommendations  Wheelchair (measurements OT);Wheelchair cushion (measurements OT);Hospital bed;3 in 1 bedside commode    Recommendations for Other Services      Precautions / Restrictions Precautions Precautions: Fall Precaution Comments: L hemiparesis, PRAFO, hand splint,  trach, SBP < 160, cortrak Required Braces or Orthoses: Other Brace Other Brace: L resting hand splint( on every 6 hours/ off every 6 hours) Restrictions Weight Bearing Restrictions: No       Mobility Bed Mobility               General bed mobility comments: Total A of two for scooting higher in bed and repositioning to L side.  Patient able to lift her head and hold arms at chest. Patient Response: Cooperative                                                                                             Exercises General  Exercises - Upper Extremity Wrist Flexion: PROM;Supine;15 reps;Left Wrist Extension: PROM;Left;15 reps;Supine Digit Composite Flexion: PROM;Supine;15 reps;Left Composite Extension: PROM;Left;15 reps;Supine   Shoulder Instructions       General Comments      Pertinent Vitals/ Pain       Pain Assessment: Faces Faces Pain Scale: No hurt Pain Intervention(s): Monitored during session                                                          Frequency  Min 2X/week        Progress Toward Goals  OT Goals(current goals can now be found in the care plan section)  Progress towards OT goals: Progressing toward goals  Acute Rehab OT Goals Patient Stated Goal: none stated OT Goal Formulation: Patient unable to participate in goal setting Time For Goal Achievement: 06/25/21 Potential to Achieve Goals: Fair ADL Goals Additional ADL Goal #2: pt will tolerate L resting hand splint for up to 6 hours on  Plan Discharge plan remains  appropriate;Frequency remains appropriate    Co-evaluation                 AM-PAC OT "6 Clicks" Daily Activity     Outcome Measure   Help from another person eating meals?: Total Help from another person taking care of personal grooming?: A Lot Help from another person toileting, which includes using toliet, bedpan, or urinal?: Total Help from another person bathing (including washing, rinsing, drying)?: Total Help from another person to put on and taking off regular upper body clothing?: Total Help from another person to put on and taking off regular lower body clothing?: Total 6 Click Score: 7    End of Session Equipment Utilized During Treatment: Oxygen  OT Visit Diagnosis: Unsteadiness on feet (R26.81);Muscle weakness (generalized) (M62.81);Hemiplegia and hemiparesis   Activity Tolerance     Patient Left in bed;with call bell/phone within reach;with bed alarm set   Nurse Communication          Time:  1210-1223 OT Time Calculation (min): 13 min  Charges: OT General Charges $OT Visit: 1 Visit OT Treatments $Therapeutic Activity: 8-22 mins  06/09/2021  Rich, OTR/L  Acute Rehabilitation Services  Office:  [phone]    Metta Clines 06/09/2021, 1:14 PM

## 2021-06-09 NOTE — Progress Notes (Signed)
[facility] PROGRESS NOTE    Progress Note  Holly Bradley  UYQ:[id number] DOB: 1976/09/24 DOA: 05/15/2021 PCP: Mitzi Hansen, MD     Brief Narrative:   Holly Bradley is an 45 y.o. female past medical history of essential hypertension, diabetes mellitus type 2 large uterine fibroids paroxysmal SVT, chronic diastolic heart failure paranoid psychosis comes into the ED on 05/16/2019 with confusion was found to have a right MCA occlusion underwent thrombectomy which was complicated by contrast extravasation and M3 coiling embolization and revascularization.  She subsequently developed subarachnoid hemorrhage repeat imaging revealed right basal ganglia infarct with a 3 mm left shift, she was treated with hypertonic saline in the ICU, on 05/17/2021 she developed respiratory distress and he was intubated CT of the head showed increased cytotoxic edema and left side shift.  No mental status improvement is now trach and with nasal collar track for tube feedings.  She has been treated with Ancef and completed a 7-day course for MSSA pneumonia, her blood cultures on 05/21/2021 were positive for staph epi.  Infectious disease was consulted and are on board she has remained off antibiotics since 2022 repeated blood cultures on 06/02/2021 have remained negative.   Significant Events: 5/24 Admit to neurology after ED visit with AMS, found to have R MCA stroke, underwent mechanical thrombectomy, M3 coil embolization, subsequent SAH 5/26 decreased mental status and respiratory distress, intubated 6/1 still not candidate for extubation with poor mentation and cough mechanics. Family unsure if pt would want trach. Staph epi bacteremia, continues on ancef 6/2 moving RUE RLE following some commands. decision to move forward with tracheostomy.   6/3 perc trach. Slight WBC increase but no fever, continues on ancef 6/4 Tolerating PSV/CPAP 6/5 WBC up to 17, last day of ancef for staph pna. Trying ATC  6/9 ID  consulted for staph epi bacteremia . Felt contamination and stopped abx. 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 Stroke team on board, they relate that there was salts over hypercoagulable work-up revealed the presence of lupus, they do recommend repeating a hypercoagulable panel in 12 weeks. I have spoken to the brother he relates, he was given discussed with his father whether they wanted G-tube placed. Surgery is on board and awaiting decision. Continue aspirin and Lipitor. Anatomy had extensive fibroid it will be hard for IR to place the tube for feeding. Brother relates he would like to proceed with G-tube feeding placement.  Acute respiratory failure with hypoxia: Currently being followed by PCCM and approved skilled assistance. Downsizing to a cuffless #6 continue routine trach care. Not a candidate for decannulation at this point. Okay to progress to [place]. Pulmonary will follow for trach care twice a week.  MSSA pneumonia: Completed 1 week course of antibiotics.  Sepsis due to staph epi there may to miss bacteremia: ID on board they recommended to hold on antibiotics as repeated blood cultures appear likely contaminant. Blood cultures have remained negative till date she has remained afebrile with no leukocytosis.  Thrombocytosis: Acting as an acute phase reactant platelets are improving.  Uterine fibroid: Extremely large palpable on exam OB/GYN was consulted they recommended to continue Megace. OB/GYN reevaluated.  They did not recommend  hysterectomy at a more surgical remover due to her current medical condition.  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  Has remained relatively stable, will continue to monitor intermittently.  Nutrition: Continue tube feedings, through core track.  History of paroxysmal SVT: Continue home amiodarone dose.  Goals of care: She has a very poor prognosis the brother has unrealistic expectations about outcomes. Will ask palliative care to continue to work with them as the patient quality of life will be poor as she will be bedbound she has no higher level function at this time communication wise.   DVT prophylaxis: scd Family Communicationbrother Status is: Inpatient  Remains inpatient appropriate because:Hemodynamically unstable  Dispo: The patient is from: Home              Anticipated d/c is to: LTAC              Patient currently is not medically stable to d/c.   Difficult to place patient No        Code Status:     Code Status Orders  (From admission, onward)           Start     Ordered   05/30/21 1101  Limited resuscitation (code)  Continuous       Question Answer Comment  In the event of cardiac or respiratory ARREST: Initiate Code Blue, Call Rapid Response Yes   In the event of cardiac or respiratory ARREST: Perform CPR No   In the event of cardiac or respiratory ARREST: Perform Intubation/Mechanical Ventilation Yes   In the event of cardiac or respiratory ARREST: Use NIPPV/BiPAp only if indicated Yes   In the event of cardiac or respiratory ARREST: Administer ACLS medications if indicated Yes   In the event of cardiac or respiratory ARREST: Perform Defibrillation or Cardioversion if indicated Yes      05/30/21 1101           Code Status History     Date Active Date Inactive Code Status Order ID Comments User Context   05/15/2021 0754 05/30/2021 1101 Full Code [id number]  Greta Doom, MD ED         IV Access:   Peripheral IV   Procedures and diagnostic studies:   No results  found.   Medical Consultants:   None.   Subjective:    Holly Bradley nonverbal  Objective:    Vitals:   06/08/21 2359 06/09/21 0337 06/09/21 0350 06/09/21 0818  BP:   122/70 120/71  Pulse: 98 88 80 86  Resp: '16 16 18 18  ' Temp:   98.9 F (37.2 C) 99.2 F (37.3 C)  TempSrc:   Oral Oral  SpO2:   100% 100%  Weight:      Height:       SpO2: 100 % O2 Flow Rate (L/min): 5 L/min FiO2 (%): 21 %   Intake/Output Summary (Last 24 hours) at 06/09/2021 0838 Last data filed at 06/09/2021 0831 Gross per 24 hour  Intake --  Output 1250 ml  Net -1250 ml    Filed Weights   06/06/21 0500 06/07/21 0500 06/08/21 0428  Weight: 76.6 kg 74.1 kg 71.5 kg    Exam: General exam: In no acute distress. Respiratory system: Good air movement and clear to auscultation. Cardiovascular system: S1 & S2 heard, RRR. No JVD. Gastrointestinal system: Abdomen is nondistended, soft and nontender.  Extremities: No pedal edema. Skin: No rashes, lesions or ulcers   Data Reviewed:  Labs: Basic Metabolic Panel: Recent Labs  Lab 06/03/21 1613 06/04/21 0457 06/06/21 0329  NA 136 136 137  K 5.2* 4.5 4.8  CL 108 105 106  CO2 19* 22 19*  GLUCOSE 81 127* 113*  BUN 20 21* 23*  CREATININE 1.09* 1.10* 1.07*  CALCIUM 9.6 9.3 9.3    GFR Estimated Creatinine Clearance: 64.4 mL/min (A) (by C-G formula based on SCr of 1.07 mg/dL (H)). Liver Function Tests: Recent Labs  Lab 06/03/21 1613 06/04/21 0457  AST 38 26  ALT 14 13  ALKPHOS 163* 144*  BILITOT 0.2* 0.4  PROT 6.4* 6.1*  ALBUMIN 2.2* 2.0*    No results for input(s): LIPASE, AMYLASE in the last 168 hours. No results for input(s): AMMONIA in the last 168 hours. Coagulation profile No results for input(s): INR, PROTIME in the last 168 hours. COVID-19 Labs  No results for input(s): DDIMER, FERRITIN, LDH, CRP in the last 72 hours.   Lab Results  Component Value Date   [place] NEGATIVE 05/15/2021    CBC: Recent Labs   Lab 06/03/21 0903 06/04/21 0457 06/05/21 1345 06/06/21 0329 06/07/21 1050  WBC 11.5* 8.9 8.7 8.1 9.0  HGB 8.6* 7.6* 8.5* 8.1* 8.9*  HCT 29.0* 26.2* 28.7* 27.4* 30.2*  MCV 79.2* 79.6* 79.1* 80.1 81.2  PLT 693* 633* 611* 547* 531*    Cardiac Enzymes: No results for input(s): CKTOTAL, CKMB, CKMBINDEX, TROPONINI in the last 168 hours. BNP (last 3 results) No results for input(s): PROBNP in the last 8760 hours. CBG: Recent Labs  Lab 06/08/21 1556 06/08/21 1940 06/08/21 2313 06/09/21 0426 06/09/21 0823  GLUCAP 102* 123* 122* 101* 132*    D-Dimer: No results for input(s): DDIMER in the last 72 hours.  Hgb A1c: No results for input(s): HGBA1C in the last 72 hours. Lipid Profile: No results for input(s): CHOL, HDL, LDLCALC, TRIG, CHOLHDL, LDLDIRECT in the last 72 hours. Thyroid function studies: No results for input(s): TSH, T4TOTAL, T3FREE, THYROIDAB in the last 72 hours.  Invalid input(s): FREET3 Anemia work up: No results for input(s): VITAMINB12, FOLATE, FERRITIN, TIBC, IRON, RETICCTPCT in the last 72 hours. Sepsis Labs: Recent Labs  Lab 06/04/21 0457 06/05/21 1345 06/06/21 0329 06/07/21 1050  WBC 8.9 8.7 8.1 9.0    Microbiology Recent Results (from the past 240 hour(s))  Culture, blood (Routine X 2) w Reflex to ID Panel     Status: None   Collection Time: 06/02/21  2:17 PM   Specimen: BLOOD LEFT HAND  Result Value Ref Range Status   Specimen Description BLOOD LEFT HAND  Final   Special Requests   Final    BOTTLES DRAWN AEROBIC AND ANAEROBIC Blood Culture adequate volume   Culture   Final    NO GROWTH 5 DAYS Performed at [facility], [place] [address], [place], [place] [address]    Report Status 06/07/2021 FINAL  Final  Culture, blood (Routine X 2) w Reflex to ID Panel     Status: None   Collection Time: 06/02/21  2:22 PM   Specimen: BLOOD  Result Value Ref Range Status   Specimen Description BLOOD LEFT ANTECUBITAL  Final   Special Requests    Final    BOTTLES DRAWN AEROBIC AND ANAEROBIC Blood Culture adequate volume   Culture   Final    NO GROWTH 5 DAYS Performed at [facility], [place] [address], [place], [place] [address]    Report Status 06/07/2021 FINAL  Final     Medications:  amiodarone  200 mg Per Tube Daily   amLODipine  10 mg Per Tube Daily   aspirin  81 mg Per Tube Daily   atorvastatin  40 mg Per Tube Daily   carvedilol  25 mg Per Tube BID WC   chlorhexidine gluconate (MEDLINE KIT)  15 mL Mouth Rinse BID   Chlorhexidine Gluconate Cloth  6 each Topical Daily   cloNIDine  0.1 mg Per Tube TID   feeding supplement (PROSource TF)  45 mL Per Tube BID   ferrous sulfate  300 mg Per Tube BID WC   free water  200 mL Per Tube Q6H   hydrALAZINE  100 mg Per Tube Q8H   isosorbide dinitrate  40 mg Per Tube TID   mouth rinse  15 mL Mouth Rinse 10 times per day   megestrol  80 mg Per Tube TID   multivitamin with minerals  1 tablet Per Tube Daily   pantoprazole sodium  40 mg Per Tube Daily   sacubitril-valsartan  1 tablet Per Tube BID   sodium chloride flush  10-40 mL Intracatheter Q12H   Continuous Infusions:  feeding supplement (JEVITY 1.5 CAL/FIBER) 1,000 mL (06/06/21 1120)      LOS: 25 days   Charlynne Cousins  [facility]  06/09/2021, 8:38 AM

## 2021-06-10 DIAGNOSIS — Z930 Tracheostomy status: Secondary | ICD-10-CM | POA: Diagnosis not present

## 2021-06-10 DIAGNOSIS — J9601 Acute respiratory failure with hypoxia: Secondary | ICD-10-CM | POA: Diagnosis not present

## 2021-06-10 DIAGNOSIS — R1310 Dysphagia, unspecified: Secondary | ICD-10-CM | POA: Diagnosis not present

## 2021-06-10 DIAGNOSIS — I63411 Cerebral infarction due to embolism of right middle cerebral artery: Secondary | ICD-10-CM | POA: Diagnosis not present

## 2021-06-10 LAB — GLUCOSE, CAPILLARY
Glucose-Capillary: 106 mg/dL — ABNORMAL HIGH (ref 70–99)
Glucose-Capillary: 114 mg/dL — ABNORMAL HIGH (ref 70–99)
Glucose-Capillary: 109 mg/dL — ABNORMAL HIGH (ref 70–99)
Glucose-Capillary: 105 mg/dL — ABNORMAL HIGH (ref 70–99)
Glucose-Capillary: 103 mg/dL — ABNORMAL HIGH (ref 70–99)

## 2021-06-10 MED ORDER — POLYETHYLENE GLYCOL 3350 17 G PO PACK
17.0000 g | PACK | Freq: Two times a day (BID) | ORAL | Status: AC
  Administered 2021-06-10 – 2021-06-11 (×2): 17 g via ORAL
  Filled 2021-06-10 (×4): qty 1

## 2021-06-10 NOTE — Progress Notes (Signed)
[facility] PROGRESS NOTE    Progress Note  Holly Bradley  YVO:[id number] DOB: 11-Jan-1976 DOA: 05/15/2021 PCP: Mitzi Hansen, MD     Brief Narrative:   Holly Bradley is an 45 y.o. female past medical history of essential hypertension, diabetes mellitus type 2 large uterine fibroids paroxysmal SVT, chronic diastolic heart failure paranoid psychosis comes into the ED on 05/16/2019 with confusion was found to have a right MCA occlusion underwent thrombectomy which was complicated by contrast extravasation and M3 coiling embolization and revascularization.  She subsequently developed subarachnoid hemorrhage repeat imaging revealed right basal ganglia infarct with a 3 mm left shift, she was treated with hypertonic saline in the ICU, on 05/17/2021 she developed respiratory distress and he was intubated CT of the head showed increased cytotoxic edema and left side shift.  No mental status improvement is now trach and with nasal collar track for tube feedings.  She has been treated with Ancef and completed a 7-day course for MSSA pneumonia, her blood cultures on 05/21/2021 were positive for staph epi.  Infectious disease was consulted and are on board she has remained off antibiotics since 2022 repeated blood cultures on 06/02/2021 have remained negative.  Significant Events: 5/24 Admit to neurology after ED visit with AMS, found to have R MCA stroke, underwent mechanical thrombectomy, M3 coil embolization, subsequent SAH 5/26 decreased mental status and respiratory distress, intubated 6/1 still not candidate for extubation with poor mentation and cough mechanics. Family unsure if pt would want trach. Staph epi bacteremia, continues on ancef 6/2 moving RUE RLE following some commands. decision to move forward with tracheostomy.   6/3 perc trach. Slight WBC increase but no fever, continues on ancef 6/4 Tolerating PSV/CPAP 6/5 WBC up to 17, last day of ancef for staph pna. Trying ATC   6/9 ID consulted for staph epi bacteremia . Felt contamination and stopped abx. 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 Stroke team on board, they relate that there was salts over hypercoagulable work-up revealed the presence of lupus, they do recommend repeating a hypercoagulable panel in 12 weeks. Surgery for PEG tube placement, the brother has had with G-tube feeding placement. Continue aspirin and Lipitor. Anatomy had extensive fibroid it will be hard for IR to place the tube for feeding.  Acute respiratory failure with hypoxia: Currently being followed by PCCM and approved skilled assistance. Continue routine trach care, not a candidate for decannulation. Okay to progress PMV. Pulmonary following for trach twice a day.  MSSA pneumonia: Completed 1 week course of antibiotics.  Sepsis due to staph epi there may to miss bacteremia: ID on board they recommended to hold on antibiotics as repeated blood cultures appear likely contaminant. Blood cultures have remained negative till date she has remained afebrile with no leukocytosis.  Thrombocytosis: Acting as an acute phase reactant platelets are improving.  Uterine fibroid: Extremely large palpable on exam OB/GYN was consulted they recommended to continue Megace. OB/GYN reevaluated.  They did not recommend hysterectomy at a more surgical remover due to her current medical condition.  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  Has remained relatively stable, will continue to monitor intermittently.  Nutrition:  Continue tube feedings, through core  track.  History of paroxysmal SVT: Continue home amiodarone dose.  Goals of care: She has a very poor prognosis the brother has unrealistic expectations about outcomes. Will ask palliative care to continue to work with them as the patient quality of life will be poor as she will be bedbound she has no higher level function at this time communication wise.   DVT prophylaxis: scd Family Communicationbrother Status is: Inpatient  Remains inpatient appropriate because:Hemodynamically unstable  Dispo: The patient is from: Home              Anticipated d/c is to: LTAC              Patient currently is not medically stable to d/c.   Difficult to place patient No        Code Status:     Code Status Orders  (From admission, onward)           Start     Ordered   05/30/21 1101  Limited resuscitation (code)  Continuous       Question Answer Comment  In the event of cardiac or respiratory ARREST: Initiate Code Blue, Call Rapid Response Yes   In the event of cardiac or respiratory ARREST: Perform CPR No   In the event of cardiac or respiratory ARREST: Perform Intubation/Mechanical Ventilation Yes   In the event of cardiac or respiratory ARREST: Use NIPPV/BiPAp only if indicated Yes   In the event of cardiac or respiratory ARREST: Administer ACLS medications if indicated Yes   In the event of cardiac or respiratory ARREST: Perform Defibrillation or Cardioversion if indicated Yes      05/30/21 1101           Code Status History     Date Active Date Inactive Code Status Order ID Comments User Context   05/15/2021 0754 05/30/2021 1101 Full Code [id number]  Greta Doom, MD ED         IV Access:   Peripheral IV   Procedures and diagnostic studies:   No results found.   Medical Consultants:   None.   Subjective:    Hyun Reali nonverbal  Objective:    Vitals:   06/10/21 0017 06/10/21 0314 06/10/21 0343 06/10/21 0902  BP: 117/62  128/69  (!) 153/78  Pulse: 88 86 88 92  Resp: _0 Temp: 98.7 F (37.1 C)  98.1 F (36.7 C) 99.6 F (37.6 C)  TempSrc: Oral  Oral Oral  SpO2: 97%  97% 100%  Weight:      Height:       SpO2: 100 % O2 Flow Rate (L/min): 5 L/min FiO2 (%): 21 %   Intake/Output Summary (Last 24 hours) at 06/10/2021 0950 Last data filed at 06/10/2021 0824 Gross per 24 hour  Intake 200 ml  Output 1350 ml  Net -1150 ml    Filed Weights   06/06/21 0500 06/07/21 0500 06/08/21 0428  Weight: 76.6 kg 74.1 kg 71.5 kg    Exam: General exam: In no acute distress. Respiratory system: Good air movement and clear to auscultation. Cardiovascular system: S1 & S2 heard, RRR. No JVD. Gastrointestinal system: Abdomen is nondistended, soft and nontender.  Extremities: No pedal edema. Skin: No rashes, lesions or ulcers   Data Reviewed:    Labs: Basic Metabolic Panel: Recent Labs  Lab 06/03/21 1613 06/04/21 0457 06/06/21 0329  NA 136 136 137  K 5.2* 4.5 4.8  CL 108 105  106  CO2 19* 22 19*  GLUCOSE 81 127* 113*  BUN 20 21* 23*  CREATININE 1.09* 1.10* 1.07*  CALCIUM 9.6 9.3 9.3    GFR Estimated Creatinine Clearance: 64.4 mL/min (A) (by C-G formula based on SCr of 1.07 mg/dL (H)). Liver Function Tests: Recent Labs  Lab 06/03/21 1613 06/04/21 0457  AST 38 26  ALT 14 13  ALKPHOS 163* 144*  BILITOT 0.2* 0.4  PROT 6.4* 6.1*  ALBUMIN 2.2* 2.0*    No results for input(s): LIPASE, AMYLASE in the last 168 hours. No results for input(s): AMMONIA in the last 168 hours. Coagulation profile No results for input(s): INR, PROTIME in the last 168 hours. COVID-19 Labs  No results for input(s): DDIMER, FERRITIN, LDH, CRP in the last 72 hours.   Lab Results  Component Value Date   [place] NEGATIVE 05/15/2021    CBC: Recent Labs  Lab 06/04/21 0457 06/05/21 1345 06/06/21 0329 06/07/21 1050  WBC 8.9 8.7 8.1 9.0  HGB 7.6* 8.5* 8.1* 8.9*  HCT 26.2* 28.7* 27.4* 30.2*  MCV 79.6* 79.1*  80.1 81.2  PLT 633* 611* 547* 531*    Cardiac Enzymes: No results for input(s): CKTOTAL, CKMB, CKMBINDEX, TROPONINI in the last 168 hours. BNP (last 3 results) No results for input(s): PROBNP in the last 8760 hours. CBG: Recent Labs  Lab 06/09/21 0823 06/09/21 1246 06/09/21 1657 06/10/21 0611 06/10/21 0823  GLUCAP 132* 107* 104* 106* 114*    D-Dimer: No results for input(s): DDIMER in the last 72 hours.  Hgb A1c: No results for input(s): HGBA1C in the last 72 hours. Lipid Profile: No results for input(s): CHOL, HDL, LDLCALC, TRIG, CHOLHDL, LDLDIRECT in the last 72 hours. Thyroid function studies: No results for input(s): TSH, T4TOTAL, T3FREE, THYROIDAB in the last 72 hours.  Invalid input(s): FREET3 Anemia work up: No results for input(s): VITAMINB12, FOLATE, FERRITIN, TIBC, IRON, RETICCTPCT in the last 72 hours. Sepsis Labs: Recent Labs  Lab 06/04/21 0457 06/05/21 1345 06/06/21 0329 06/07/21 1050  WBC 8.9 8.7 8.1 9.0    Microbiology Recent Results (from the past 240 hour(s))  Culture, blood (Routine X 2) w Reflex to ID Panel     Status: None   Collection Time: 06/02/21  2:17 PM   Specimen: BLOOD LEFT HAND  Result Value Ref Range Status   Specimen Description BLOOD LEFT HAND  Final   Special Requests   Final    BOTTLES DRAWN AEROBIC AND ANAEROBIC Blood Culture adequate volume   Culture   Final    NO GROWTH 5 DAYS Performed at [facility], [place] [address], [place], [place] [address]    Report Status 06/07/2021 FINAL  Final  Culture, blood (Routine X 2) w Reflex to ID Panel     Status: None   Collection Time: 06/02/21  2:22 PM   Specimen: BLOOD  Result Value Ref Range Status   Specimen Description BLOOD LEFT ANTECUBITAL  Final   Special Requests   Final    BOTTLES DRAWN AEROBIC AND ANAEROBIC Blood Culture adequate volume   Culture   Final    NO GROWTH 5 DAYS Performed at [facility], [place] [address], [place], [place] [address]    Report  Status 06/07/2021 FINAL  Final     Medications:    amiodarone  200 mg Per Tube Daily   amLODipine  10 mg Per Tube Daily   aspirin  81 mg Per Tube Daily   atorvastatin  40 mg Per  Tube Daily   carvedilol  25 mg Per Tube BID WC   chlorhexidine gluconate (MEDLINE KIT)  15 mL Mouth Rinse BID   Chlorhexidine Gluconate Cloth  6 each Topical Daily   cloNIDine  0.1 mg Per Tube TID   feeding supplement (PROSource TF)  45 mL Per Tube BID   ferrous sulfate  300 mg Per Tube BID WC   free water  200 mL Per Tube Q6H   hydrALAZINE  100 mg Per Tube Q8H   isosorbide dinitrate  40 mg Per Tube TID   mouth rinse  15 mL Mouth Rinse 10 times per day   megestrol  80 mg Per Tube TID   multivitamin with minerals  1 tablet Per Tube Daily   pantoprazole sodium  40 mg Per Tube Daily   sacubitril-valsartan  1 tablet Per Tube BID   sodium chloride flush  10-40 mL Intracatheter Q12H   Continuous Infusions:  feeding supplement (JEVITY 1.5 CAL/FIBER) 1,000 mL (06/09/21 1244)      LOS: 26 days   Charlynne Cousins  [facility]  06/10/2021, 9:50 AM

## 2021-06-10 NOTE — Plan of Care (Signed)
  Problem: Education: Goal: Knowledge of disease or condition will improve Outcome: Progressing Goal: Knowledge of secondary prevention will improve Outcome: Progressing   Problem: Education: Goal: Knowledge of General Education information will improve Description: Including pain rating scale, medication(s)/side effects and non-pharmacologic comfort measures Outcome: Progressing   Problem: Health Behavior/Discharge Planning: Goal: Ability to manage health-related needs will improve Outcome: Progressing   Problem: Clinical Measurements: Goal: Ability to maintain clinical measurements within normal limits will improve Outcome: Progressing Goal: Will remain free from infection Outcome: Progressing Goal: Diagnostic test results will improve Outcome: Progressing Goal: Respiratory complications will improve Outcome: Progressing Goal: Cardiovascular complication will be avoided Outcome: Progressing   Problem: Activity: Goal: Risk for activity intolerance will decrease Outcome: Progressing   Problem: Nutrition: Goal: Adequate nutrition will be maintained Outcome: Progressing   Problem: Coping: Goal: Level of anxiety will decrease Outcome: Progressing   Problem: Elimination: Goal: Will not experience complications related to bowel motility Outcome: Progressing Goal: Will not experience complications related to urinary retention Outcome: Progressing   Problem: Pain Managment: Goal: General experience of comfort will improve Outcome: Progressing   Problem: Safety: Goal: Ability to remain free from injury will improve Outcome: Progressing   Problem: Skin Integrity: Goal: Risk for impaired skin integrity will decrease Outcome: Progressing

## 2021-06-11 DIAGNOSIS — Z430 Encounter for attention to tracheostomy: Secondary | ICD-10-CM

## 2021-06-11 DIAGNOSIS — I63411 Cerebral infarction due to embolism of right middle cerebral artery: Secondary | ICD-10-CM | POA: Diagnosis not present

## 2021-06-11 DIAGNOSIS — J9601 Acute respiratory failure with hypoxia: Secondary | ICD-10-CM | POA: Diagnosis not present

## 2021-06-11 DIAGNOSIS — G936 Cerebral edema: Secondary | ICD-10-CM | POA: Diagnosis not present

## 2021-06-11 DIAGNOSIS — I5022 Chronic systolic (congestive) heart failure: Secondary | ICD-10-CM | POA: Diagnosis not present

## 2021-06-11 LAB — GLUCOSE, CAPILLARY
Glucose-Capillary: 103 mg/dL — ABNORMAL HIGH (ref 70–99)
Glucose-Capillary: 106 mg/dL — ABNORMAL HIGH (ref 70–99)
Glucose-Capillary: 107 mg/dL — ABNORMAL HIGH (ref 70–99)
Glucose-Capillary: 108 mg/dL — ABNORMAL HIGH (ref 70–99)
Glucose-Capillary: 111 mg/dL — ABNORMAL HIGH (ref 70–99)
Glucose-Capillary: 115 mg/dL — ABNORMAL HIGH (ref 70–99)
Glucose-Capillary: 122 mg/dL — ABNORMAL HIGH (ref 70–99)

## 2021-06-11 NOTE — Progress Notes (Signed)
To proceed[facility]ITAL[facility] PROGRESS NOTE    Progress Note  Ruberta Holck  YVO:[id number] DOB: 09/25/1976 DOA: 05/15/2021 PCP: Mitzi Hansen, MD     Brief Narrative:   Holly Bradley is an 45 y.o. female past medical history of essential hypertension, diabetes mellitus type 2 large uterine fibroids paroxysmal SVT, chronic diastolic heart failure paranoid psychosis comes into the ED on 05/16/2019 with confusion was found to have a right MCA occlusion underwent thrombectomy which was complicated by contrast extravasation and M3 coiling embolization and revascularization.  She subsequently developed subarachnoid hemorrhage repeat imaging revealed right basal ganglia infarct with a 3 mm left shift, she was treated with hypertonic saline in the ICU, on 05/17/2021 she developed respiratory distress and he was intubated CT of the head showed increased cytotoxic edema and left side shift.  No mental status improvement is now trach and with nasal collar track for tube feedings.  She has been treated with Ancef and completed a 7-day course for MSSA pneumonia, her blood cultures on 05/21/2021 were positive for staph epi.  Infectious disease was consulted and are on board she has remained off antibiotics since 2022 repeated blood cultures on 06/02/2021 have remained negative.  Significant Events: 5/24 Admit to neurology after ED visit with AMS, found to have R MCA stroke, underwent mechanical thrombectomy, M3 coil embolization, subsequent SAH 5/26 decreased mental status and respiratory distress, intubated 6/1 still not candidate for extubation with poor mentation and cough mechanics. Family unsure if pt would want trach. Staph epi bacteremia, continues on ancef 6/2 moving RUE RLE following some commands. decision to move forward with tracheostomy.   6/3 perc trach. Slight WBC increase but no fever, continues on ancef 6/4 Tolerating PSV/CPAP 6/5 WBC up to 17, last day of ancef for staph pna. Trying ATC   6/9 ID consulted for staph epi bacteremia . Felt contamination and stopped abx. 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 Stroke team on board, they relate that there was salts over hypercoagulable work-up revealed the presence of lupus, they do recommend repeating a hypercoagulable panel in 12 weeks. Surgery for PEG tube placement, the brother has had with G-tube feeding placement. Continue aspirin and Lipitor. Anatomy had extensive fibroid it will be hard for IR to place the tube for feeding.  Acute respiratory failure with hypoxia: Currently being followed by PCCM and approved skilled assistance. Continue routine trach care, not a candidate for decannulation. Okay to progress PMV. Pulmonary following for trach twice a day.  MSSA pneumonia: Completed 1 week course of antibiotics.  Sepsis due to staph epi there may to miss bacteremia: ID on board they recommended to hold on antibiotics as repeated blood cultures appear likely contaminant. Blood cultures have remained negative till date she has remained afebrile with no leukocytosis.  Thrombocytosis: Acting as an acute phase reactant platelets are improving.  Uterine fibroid: Extremely large palpable on exam OB/GYN was consulted they recommended to continue Megace. OB/GYN reevaluated.  They did not recommend hysterectomy at a more surgical remover due to her current medical condition.  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  Has remained relatively stable, will continue to monitor intermittently.  Nutrition:  Continue tube feedings, through core  track.  History of paroxysmal SVT: Continue home amiodarone dose.  Preop evaluation: General surgery is required now cardiac evaluation to proceed with surgery she is low to moderate risk had no anginal symptoms no chest pain before this episode. Will consult cardiology  Goals of care: She has a very poor prognosis the brother has unrealistic expectations about outcomes. Will ask palliative care to continue to work with them as the patient quality of life will be poor as she will be bedbound she has no higher level function at this time communication wise.   DVT prophylaxis: scd Family Communicationbrother Status is: Inpatient  Remains inpatient appropriate because:Hemodynamically unstable  Dispo: The patient is from: Home              Anticipated d/c is to: LTAC              Patient currently is not medically stable to d/c.   Difficult to place patient No        Code Status:     Code Status Orders  (From admission, onward)           Start     Ordered   05/30/21 1101  Limited resuscitation (code)  Continuous       Question Answer Comment  In the event of cardiac or respiratory ARREST: Initiate Code Blue, Call Rapid Response Yes   In the event of cardiac or respiratory ARREST: Perform CPR No   In the event of cardiac or respiratory ARREST: Perform Intubation/Mechanical Ventilation Yes   In the event of cardiac or respiratory ARREST: Use NIPPV/BiPAp only if indicated Yes   In the event of cardiac or respiratory ARREST: Administer ACLS medications if indicated Yes   In the event of cardiac or respiratory ARREST: Perform Defibrillation or Cardioversion if indicated Yes      05/30/21 1101           Code Status History     Date Active Date Inactive Code Status Order ID Comments User Context   05/15/2021 0754 05/30/2021 1101 Full Code [id number]  Greta Doom, MD ED         IV Access:   Peripheral IV   Procedures and diagnostic studies:   No  results found.   Medical Consultants:   None.   Subjective:    Cai Flott nonverbal  Objective:    Vitals:   06/11/21 0331 06/11/21 0447 06/11/21 0603 06/11/21 0844  BP: (!) 151/74 (!) 151/74 (!) 152/69   Pulse: 80 80    Resp: 16 16    Temp: 99.2 F (37.3 C)     TempSrc: Oral     SpO2: 100% 99%  100%  Weight:      Height:       SpO2: 100 % O2 Flow Rate (L/min): 5 L/min FiO2 (%): 21 %   Intake/Output Summary (Last 24 hours) at 06/11/2021 1033 Last data filed at 06/11/2021 0700 Gross per 24 hour  Intake 2680 ml  Output 2100 ml  Net 580 ml    Filed Weights   06/06/21 0500 06/07/21 0500 06/08/21 0428  Weight: 76.6 kg 74.1 kg 71.5 kg    Exam: General exam: In no acute distress. Respiratory system: Good air movement and clear to auscultation. Cardiovascular system: S1 & S2 heard, RRR. No JVD. Gastrointestinal system: Abdomen is nondistended, soft and nontender.  Extremities: No pedal edema. Skin: No rashes, lesions or ulcers  Data Reviewed:  Labs: Basic Metabolic Panel: Recent Labs  Lab 06/06/21 0329  NA 137  K 4.8  CL 106  CO2 19*  GLUCOSE 113*  BUN 23*  CREATININE 1.07*  CALCIUM 9.3    GFR Estimated Creatinine Clearance: 64.4 mL/min (A) (by C-G formula based on SCr of 1.07 mg/dL (H)). Liver Function Tests: No results for input(s): AST, ALT, ALKPHOS, BILITOT, PROT, ALBUMIN in the last 168 hours.  No results for input(s): LIPASE, AMYLASE in the last 168 hours. No results for input(s): AMMONIA in the last 168 hours. Coagulation profile No results for input(s): INR, PROTIME in the last 168 hours. COVID-19 Labs  No results for input(s): DDIMER, FERRITIN, LDH, CRP in the last 72 hours.   Lab Results  Component Value Date   [place] NEGATIVE 05/15/2021    CBC: Recent Labs  Lab 06/05/21 1345 06/06/21 0329 06/07/21 1050  WBC 8.7 8.1 9.0  HGB 8.5* 8.1* 8.9*  HCT 28.7* 27.4* 30.2*  MCV 79.1* 80.1 81.2  PLT 611* 547* 531*     Cardiac Enzymes: No results for input(s): CKTOTAL, CKMB, CKMBINDEX, TROPONINI in the last 168 hours. BNP (last 3 results) No results for input(s): PROBNP in the last 8760 hours. CBG: Recent Labs  Lab 06/10/21 1628 06/10/21 2006 06/11/21 0001 06/11/21 0457 06/11/21 0751  GLUCAP 105* 103* 103* 111* 115*    D-Dimer: No results for input(s): DDIMER in the last 72 hours.  Hgb A1c: No results for input(s): HGBA1C in the last 72 hours. Lipid Profile: No results for input(s): CHOL, HDL, LDLCALC, TRIG, CHOLHDL, LDLDIRECT in the last 72 hours. Thyroid function studies: No results for input(s): TSH, T4TOTAL, T3FREE, THYROIDAB in the last 72 hours.  Invalid input(s): FREET3 Anemia work up: No results for input(s): VITAMINB12, FOLATE, FERRITIN, TIBC, IRON, RETICCTPCT in the last 72 hours. Sepsis Labs: Recent Labs  Lab 06/05/21 1345 06/06/21 0329 06/07/21 1050  WBC 8.7 8.1 9.0    Microbiology Recent Results (from the past 240 hour(s))  Culture, blood (Routine X 2) w Reflex to ID Panel     Status: None   Collection Time: 06/02/21  2:17 PM   Specimen: BLOOD LEFT HAND  Result Value Ref Range Status   Specimen Description BLOOD LEFT HAND  Final   Special Requests   Final    BOTTLES DRAWN AEROBIC AND ANAEROBIC Blood Culture adequate volume   Culture   Final    NO GROWTH 5 DAYS Performed at [facility], [place] [address] [place], [place] [address]    Report Status 06/07/2021 FINAL  Final  Culture, blood (Routine X 2) w Reflex to ID Panel     Status: None   Collection Time: 06/02/21  2:22 PM   Specimen: BLOOD  Result Value Ref Range Status   Specimen Description BLOOD LEFT ANTECUBITAL  Final   Special Requests   Final    BOTTLES DRAWN AEROBIC AND ANAEROBIC Blood Culture adequate volume   Culture   Final    NO GROWTH 5 DAYS Performed at [facility], [place] [address], [place], [place] [address]    Report Status 06/07/2021 FINAL  Final     Medications:     amiodarone  200 mg Per Tube Daily   amLODipine  10 mg Per Tube Daily   aspirin  81 mg Per Tube Daily   atorvastatin  40 mg Per Tube Daily   carvedilol  25 mg Per Tube BID WC   chlorhexidine gluconate (MEDLINE KIT)  15 mL  Mouth Rinse BID   Chlorhexidine Gluconate Cloth  6 each Topical Daily   cloNIDine  0.1 mg Per Tube TID   feeding supplement (PROSource TF)  45 mL Per Tube BID   ferrous sulfate  300 mg Per Tube BID WC   free water  200 mL Per Tube Q6H   hydrALAZINE  100 mg Per Tube Q8H   isosorbide dinitrate  40 mg Per Tube TID   mouth rinse  15 mL Mouth Rinse 10 times per day   megestrol  80 mg Per Tube TID   multivitamin with minerals  1 tablet Per Tube Daily   pantoprazole sodium  40 mg Per Tube Daily   polyethylene glycol  17 g Oral BID   sacubitril-valsartan  1 tablet Per Tube BID   sodium chloride flush  10-40 mL Intracatheter Q12H   Continuous Infusions:  feeding supplement (JEVITY 1.5 CAL/FIBER) 1,000 mL (06/11/21 0948)      LOS: 27 days   Charlynne Cousins  [facility]  06/11/2021, 10:33 AM

## 2021-06-11 NOTE — Progress Notes (Signed)
Physical Therapy Treatment Patient Details Name: Holly Bradley MRN: [id number] DOB: [birth year] Today's Date: 06/11/2021    History of Present Illness 45 yo female s/p mechanical thrombectomy on 5/24 of right MCA M1/M2/M3 and embolization of right MCA M3 branch due to contrast extravasation. Pt re-intubated 5/26. Repeat head CT on 5/26 showed increased cytotoxic edema and mildly increased leftward midline shift, now 5 mm, decreased SAH and IVH. Abdominal CT showing 22.9 cm mass, possibly consistent with uterine fibroids, no lymphadenopathy. s/p trach 6/3. PMH fibriods HTN HF paranoid psychosis recent hospitalizations january and february 2022    PT Comments    Bed placed in chair position to perform BUE/LE exercises/ROM. A/AAROM exercises RUE/LE. PROM LUE/LE. L resting hand splint removed and LUE elevated on pillow. Bed transitioned out of chair position with HOB elevated to 45 degrees. Pillows used for positioning/comfort. Pt alert and following simple commands with increased time. No vocalizations noted despite having PMV in place. PMV removed at end of session.     Follow Up Recommendations  SNF     Equipment Recommendations  Wheelchair (measurements PT);Wheelchair cushion (measurements PT);Hospital bed;Other (comment) (hoyer lift)    Recommendations for Other Services       Precautions / Restrictions Precautions Precautions: Fall;Other (comment) Precaution Comments: L hemiparesis, PRAFO, hand splint,  trach, SBP < 160, cortrak Other Brace: L resting hand splint (4 hours on/4 hours off)    Mobility  Bed Mobility                    Transfers                    Ambulation/Gait                 Stairs             Wheelchair Mobility    Modified Rankin (Stroke Patients Only)       Balance                                            Cognition Arousal/Alertness: Awake/alert;Lethargic (falls asleep quickly without  interaction) Behavior During Therapy: Flat affect Overall Cognitive Status: Difficult to assess Area of Impairment: Following commands                       Following Commands: Follows one step commands with increased time;Follows one step commands inconsistently       General Comments: Pt nods appropriately during session. SLP placed PMV on pt but no vocalizations noted.      Exercises General Exercises - Upper Extremity Shoulder Flexion: AROM;Right;5 reps Shoulder ABduction: AROM;Right;5 reps Elbow Flexion: AROM;Right;5 reps General Exercises - Lower Extremity Ankle Circles/Pumps: Right;5 reps;AAROM Heel Slides: AROM;Right;5 reps Hip ABduction/ADduction: AROM;5 reps;Right    General Comments General comments (skin integrity, edema, etc.): Bed placed in chair position for exercises.      Pertinent Vitals/Pain Pain Assessment: Faces Faces Pain Scale: Hurts a little bit Pain Location: generalized discomfort with coughing Pain Descriptors / Indicators: Grimacing;Discomfort Pain Intervention(s): Monitored during session    Home Living                      Prior Function            PT Goals (current goals can now be found in the care plan section)  Acute Rehab PT Goals Patient Stated Goal: none stated Progress towards PT goals: Progressing toward goals    Frequency    Min 3X/week      PT Plan Current plan remains appropriate    Co-evaluation              AM-PAC PT "6 Clicks" Mobility   Outcome Measure  Help needed turning from your back to your side while in a flat bed without using bedrails?: Total Help needed moving from lying on your back to sitting on the side of a flat bed without using bedrails?: Total Help needed moving to and from a bed to a chair (including a wheelchair)?: Total Help needed standing up from a chair using your arms (e.g., wheelchair or bedside chair)?: Total Help needed to walk in hospital room?: Total Help  needed climbing 3-5 steps with a railing? : Total 6 Click Score: 6    End of Session Equipment Utilized During Treatment: Oxygen Activity Tolerance: Patient tolerated treatment well Patient left: in bed;with call bell/phone within reach;with bed alarm set Nurse Communication: Other (comment) (resting hand splint removed) PT Visit Diagnosis: Hemiplegia and hemiparesis;Other abnormalities of gait and mobility (R26.89);Unsteadiness on feet (R26.81);Muscle weakness (generalized) (M62.81);Difficulty in walking, not elsewhere classified (R26.2);Other symptoms and signs involving the nervous system (R29.898);Apraxia (R48.2) Hemiplegia - Right/Left: Left Hemiplegia - dominant/non-dominant: Non-dominant Hemiplegia - caused by: Cerebral infarction;Nontraumatic SAH;Nontraumatic intracerebral hemorrhage     Time: 1145-1205 PT Time Calculation (min) (ACUTE ONLY): 20 min  Charges:  $Therapeutic Exercise: 8-22 mins                     Lorrin Goodell, PT  Office # [phone] Pager [phone]    Holly Bradley 06/11/2021, 12:58 PM

## 2021-06-11 NOTE — Progress Notes (Signed)
[facility] Progress Note  27 Days Post-Op  Subjective: CC:  Resting comfortably. No family at bedside. TF running at goal. Palliative following  Objective: Vital signs in last 24 hours: Temp:  [98.6 F (37 C)-99.6 F (37.6 C)] 99.2 F (37.3 C) (06/20 0331) Pulse Rate:  [75-92] 80 (06/20 0447) Resp:  [16-21] 16 (06/20 0447) BP: (106-159)/(65-78) 152/69 (06/20 0603) SpO2:  [97 %-100 %] 99 % (06/20 0447) FiO2 (%):  [21 %] 21 % (06/20 0447) Last BM Date: 06/08/21  Intake/Output from previous day: 06/19 0701 - 06/20 0700 In: 2680 [NG/GT:2680] Out: 2650 [Urine:2650] Intake/Output this shift: No intake/output data recorded.  PE: Gen:  Alert, NAD, cooperative but nonverbal and occasionally nods when spoken to Head: cortrak in R nare w/ TF running at 50 cc/hr Neck: trach in place on TC Card:  rrr Pulm:  Normal effort, rales left lung base Abd: Soft, non-tender, non-distended, large, palpable mass from pelvis up past umbilicus. Skin: warm and dry, no rashes  Psych: A&Ox3   Lab Results:  No results for input(s): WBC, HGB, HCT, PLT in the last 72 hours.  BMET No results for input(s): NA, K, CL, CO2, GLUCOSE, BUN, CREATININE, CALCIUM in the last 72 hours.  PT/INR No results for input(s): LABPROT, INR in the last 72 hours. CMP     Component Value Date/Time   NA 137 06/06/2021 0329   K 4.8 06/06/2021 0329   CL 106 06/06/2021 0329   CO2 19 (L) 06/06/2021 0329   GLUCOSE 113 (H) 06/06/2021 0329   BUN 23 (H) 06/06/2021 0329   CREATININE 1.07 (H) 06/06/2021 0329   CALCIUM 9.3 06/06/2021 0329   PROT 6.1 (L) 06/04/2021 0457   ALBUMIN 2.0 (L) 06/04/2021 0457   AST 26 06/04/2021 0457   ALT 13 06/04/2021 0457   ALKPHOS 144 (H) 06/04/2021 0457   BILITOT 0.4 06/04/2021 0457   GFRNONAA >60 06/06/2021 0329   Lipase  No results found for: LIPASE     Studies/Results: No results found.  Anti-infectives: Anti-infectives (From admission, onward)    Start      Dose/Rate Route Frequency Ordered Stop   05/30/21 1515  ceFAZolin (ANCEF) IVPB 2g/100 mL premix  Status:  Discontinued        2 g 200 mL/hr over 30 Minutes Intravenous Every 8 hours 05/30/21 1415 05/31/21 1404   05/21/21 1100  ceFAZolin (ANCEF) IVPB 2g/100 mL premix        2 g 200 mL/hr over 30 Minutes Intravenous Every 8 hours 05/21/21 1030 05/28/21 0537        Assessment/Plan  Dysphagia Consult for PEG placement - tolerating TF via cortrak at 50 cc/hr and having BMs. - patient also with 22.9 x 22.9 x 13.8 cm uterine fibroid and abdomen firm on palpation - patient seems to be clinically improving from MSSA PNA and possible bacteremia, now off of abx and afebrile for about 48 hours. - failed swallow eval earlier in admission, s/p trach now but still has cuffed trach - consider re-eval once changed to cuffless trach; currently with cuffless trach; would follow-up with speech about timing of potential transition to PO/further attempts at evaluation. If prolonged duration and brother interested in proceeding with exploratory laparotomy and open G tube placement can proceed in next day or two as OR availability allows -Would obtain cardiac clearance  - Our service spoke with Dr. Olevia Bowens 6/16 whom stated the patients brother will have a final decision regarding G-tube.  - below per primary  attending - R MCA CVA complicated by [facility] with midline shift and cerebral edema Acute on chronic anemia, multifactorial AKI S/P tracheostomy MSSA PNA - completed course of ancef x 7 days ?staph epidermis bacteremia - blood cx 5/30 positive for staph epidermis and staph hominis, per ID monitor off of abx for now, staph epi should have been covered by 7d ancef.  Leukocytosis - resolved  T2DM controlled with diet/exercise Uncontrolled HTN HLD CHF with pEF SVT Uterine fibroids - with chronic vaginal bleeding, H&H stable 8.1/27.4 Hx of paranoid psychosis    LOS: 27 days   Nadeen Landau, MD  [facility] Use [email or web address] to contact on call provider

## 2021-06-11 NOTE — Progress Notes (Signed)
NAME:  Holly Bradley, MRN:  [id number], DOB:  [birth year], LOS: 24  ADMISSION DATE:  05/15/2021, CONSULTATION DATE:  06/11/21 REFERRING MD:  Leonie Man, CHIEF COMPLAINT:  Respiratory failure   History of Present Illness:  Holly Bradley is a [name] with PMH of HTN, tobacco use, HFpF, DMT2 (controlled with diet and exercise), pSVT, uterine fibroids, paranoid psychosis who presented with AMS on 5/24.   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  On follow up imaging, she had stable appearing SAH and continued R basal ganglia infarct with 54mm R to L shift.   She was admitted to the Neuro ICU and treating with hypertonic saline, statin and BP control.  On 5/26, PCCM consulted when pt became acutely less responsive with respiratory distress along with abdominal distension worse than baseline, per patient.  She was intubated and repeat head CT, abd/pelvis CT ordered.  Repeat head CT on 5/26 showed increased cytotoxic edema and mildly increased leftward midline shift, now 5 mm, decreased SAH and IVH.  Abdominal CT showing 22.9 cm mass, possibly consistent with uterine fibroids, no lymphadenopathy.      Significant Hospital Events: Including procedures, antibiotic start and stop dates in addition to other pertinent events   5/24 Admit to neurology after ED visit with AMS, found to have R MCA stroke, underwent mechanical thrombectomy, M3 coil embolization, subsequent SAH 5/26 decreased mental status and respiratory distress, intubated  6/1 still not candidate for extubation with poor mentation and cough mechanics. Family unsure if pt would want trach. Staph epi bacteremia, continues on ancef  6/2 moving RUE RLE following some commands. decision to move forward with tracheostomy.   6/3 perc trach. Slight WBC increase but no  fever, continues on ancef  6/4 Tolerating PSV/CPAP  6/5 WBC up to 17, last day of ancef for staph pna. Trying ATC  6/9 ID consulted for staph epi bacteremia . Felt contamination and stopped abx.  6/12 had fever spikes but nothing to explain  6/14 Tmax 100.1, no leukocytosis, no fevers since.  Interim History / Subjective:  No overnight issues. Afebrile. Planning for PEG  Objective   Blood pressure (!) 152/69, pulse 80, temperature 99.2 F (37.3 C), temperature source Oral, resp. rate 16, height 5\' 5"  (1.651 m), weight 71.5 kg, SpO2 100 %.    FiO2 (%):  [21 %] 21 %   Intake/Output Summary (Last 24 hours) at 06/11/2021 1135 Last data filed at 06/11/2021 0700 Gross per 24 hour  Intake 2680 ml  Output 2100 ml  Net 580 ml   Filed Weights   06/06/21 0500 06/07/21 0500 06/08/21 0428  Weight: 76.6 kg 74.1 kg 71.5 kg   General: chronically ill appearing woman lying in bed in NAD HEENT: mmm. Sclera anicteric Neck: trach in place without erythema or drainage around trach. Shiley # 6 cuffed, on trach collar.  Pulm: breathing comfortably on TC, no wheezing or rhonchi Card S1S2, RRR Abd : fibroids hard in lower abdomen, soft Extremities: moving R side, left side in contracture braces Neuro: moving right side, follows simple commands, but not consistently.   Labs/imaging that I havepersonally reviewed  (right click and "Reselect all SmartList Selections" daily)  WBC 9.0, Hgb 8.9  Assessment & Plan:   Acute respiratory failure with hypoxia S/p tracheostomy MSSA pneumonia (completed rx) Acute encephalopathy: Multifactorial, in setting of intracranial  process below, delirium, infection/metabolic abnormalities  R MCA CVA complicated with with SAH and midline shift, cerebral edema  Staph epidermis bacteremia (not clear if contaminant; treated) Sepsis due to staph a. 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  - would maintain cuffed trach until this has been completed, we can change out to cuffless once this is complete.   Rest of care per primary. PCCM will continue to follow 2 days per week. Please call in the interim with questions.  Lenice Llamas, MD Pulmonary and [place]

## 2021-06-11 NOTE — Consult Note (Addendum)
Cardiology Consultation:   Patient ID: Holly Bradley MRN: [id number]; DOB: 12-04-1976  Admit date: 05/15/2021 Date of Consult: 06/11/2021  PCP:  Mitzi Hansen, MD   [facility] Providers Cardiologist:  Freada Bergeron, MD  Advanced Heart Failure:  Glori Bickers, MD  {   Patient Profile:   Holly Bradley is a 45 y.o. 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 Aileen Fass.  History of Present Illness:   Holly Bradley is a 45 year old female with past medical history noted above.  A majority of her past medical history centers around severe uterine fibroids with associated bleeding along with poorly controlled hypertension and tobacco use.  She had been referred to IR in the past for embolization procedure which did improve fibroid bleeding.  She was admitted in May 2021 with SVT.  This occurred in the setting of severe anemia with a hemoglobin of 4.4 due to ongoing uterine bleeding.  Additionally was found to have a low EF secondary to SVT, marked anemia and poorly controlled hypertension.  She was given adenosine and PRBCs and converted to sinus rhythm.  She was placed on beta-blocker and ACE, restarted her nifedipine.  Was again admitted 1/22 with dyspnea and palpitations in the setting of SVT.  This terminated with adenosine.  Was also hypertensive with AKI and responded well to IV Lasix with improvement in her renal function and transition to oral Lasix.  Hospitalization was complicated by episodes of NSVT.  She was continued on Imdur, metoprolol XL and amiodarone.   She was seen in the advanced heart failure clinic for the first time 2/23 after being referred.  She was markedly volume overloaded and hypertensive.  Weight was up 20 pounds.  She was given IV  Lasix and metolazone in the clinic with 1 L out.  Instructed to take metolazone 2.5 mg daily for 3 days and Entresto was increased to 49-51 twice daily and torsemide increased to 40 mg twice daily. Since in follow up again 3/1 with increase in Entresto 97-103 and spiro 12.56m BID added. Echo 1/22 with EF of 20-25%.  Was evaluated by gynecology with recommendation of surgical intervention for large uterine fibroid. Started on megestrol with less bleeding.   ReDs clip normal at 30%  at last office visit on 03/20/21. Blood pressures remained elevated, spiro was increased 25mBID.and Imdur increased to 9072maily. Followed by paramedicine.   On 5/24 she was admitted to neurology after presenting to the ED with altered mental status and found to have right MCA stroke.  Underwent mechanical thrombectomy and M3 coil embolization with subsequent subarachnoid hemorrhage.  Required intubation, and eventually underwent tracheostomy on 6/3.  Developed increasing leukocytosis and placed on antibiotics.  ID consulted 6/9 for staph epi bacteremia which was felt to be contamination and stop antibiotics.  Had intermittent fever spikes between 6/12 - 6/14 but no apparent cause.  Now planned for a PEG to be placed in the OR later this week, cardiology asked to weight in regarding pre op evaluation.     Past Medical History:  Diagnosis Date   Hypertension     Past Surgical History:  Procedure Laterality Date   IR ANGIOGRAM FOLLOW UP STUDY  05/15/2021   IR CT HEAD LTD  05/15/2021   IR PERCUTANEOUS ART THROMBECTOMY/INFUSION INTRACRANIAL INC DIAG ANGIO  05/15/2021  IR PERCUTANEOUS ART THROMBECTOMY/INFUSION INTRACRANIAL INC DIAG ANGIO  05/15/2021   IR TRANSCATH/EMBOLIZ  05/15/2021   RADIOLOGY WITH ANESTHESIA N/A 05/15/2021   Procedure: IR WITH ANESTHESIA;  Surgeon: Radiologist, Medication, MD;  Location: [facility];  Service: Radiology;  Laterality: N/A;     Home Medications:  Prior to Admission medications    Medication Sig Start Date End Date Taking? Authorizing Provider  amiodarone (PACERONE) 200 MG tablet Take 200 mg by mouth daily. 02/02/21   [provider]  ENTRESTO 97-103 MG Take 1 tablet by mouth 2 (two) times daily. 02/20/21   [provider]  FEROSUL 325 (65 Fe) MG tablet Take 325 mg by mouth 2 (two) times daily. 02/20/21   [provider]  hydrALAZINE (APRESOLINE) 100 MG tablet Take 100 mg by mouth 3 (three) times daily. 02/12/21   [provider]  isosorbide mononitrate (IMDUR) 60 MG 24 hr tablet Take 60 mg by mouth daily. 02/02/21   [provider]  KLOR-CON M20 20 MEQ tablet Take 40 mEq by mouth 2 (two) times daily. 02/12/21   [provider]  megestrol (MEGACE) 20 MG tablet Take 40 mg by mouth in the morning and at bedtime. May increase to 4 tablets ( 80 mg) twice daily in the event of heavy breathing 03/08/21   [provider]  metoprolol (TOPROL-XL) 200 MG 24 hr tablet Take 200 mg by mouth daily. 02/02/21   [provider]  torsemide (DEMADEX) 20 MG tablet Take 40 mg by mouth 2 (two) times daily. 02/12/21   [provider]    Inpatient Medications: Scheduled Meds:  amiodarone  200 mg Per Tube Daily   amLODipine  10 mg Per Tube Daily   aspirin  81 mg Per Tube Daily   atorvastatin  40 mg Per Tube Daily   carvedilol  25 mg Per Tube BID WC   chlorhexidine gluconate (MEDLINE KIT)  15 mL Mouth Rinse BID   Chlorhexidine Gluconate Cloth  6 each Topical Daily   cloNIDine  0.1 mg Per Tube TID   feeding supplement (PROSource TF)  45 mL Per Tube BID   ferrous sulfate  300 mg Per Tube BID WC   free water  200 mL Per Tube Q6H   hydrALAZINE  100 mg Per Tube Q8H   isosorbide dinitrate  40 mg Per Tube TID   mouth rinse  15 mL Mouth Rinse 10 times per day   megestrol  80 mg Per Tube TID   multivitamin with minerals  1 tablet Per Tube Daily   pantoprazole sodium  40 mg Per Tube Daily   polyethylene glycol  17 g Oral BID    sacubitril-valsartan  1 tablet Per Tube BID   sodium chloride flush  10-40 mL Intracatheter Q12H   Continuous Infusions:  feeding supplement (JEVITY 1.5 CAL/FIBER) 1,000 mL (06/11/21 0948)   PRN Meds: acetaminophen **OR** acetaminophen (TYLENOL) oral liquid 160 mg/5 mL **OR** acetaminophen, loperamide HCl, simethicone, sodium chloride flush  Allergies:   No Known Allergies  Social History:   Social History   Socioeconomic History   Marital status: Single    Spouse name: Not on file   Number of children: Not on file   Years of education: Not on file   Highest education level: Not on file  Occupational History   Not on file  Tobacco Use   Smoking status: Every Day    Pack years: 0.00   Smokeless tobacco: Never  Substance and Sexual Activity  Alcohol use: Not on file   Drug use: Not on file   Sexual activity: Not on file  Other Topics Concern   Not on file  Social History Narrative   Not on file   Social Determinants of Health   Financial Resource Strain: Not on file  Food Insecurity: Not on file  Transportation Needs: Not on file  Physical Activity: Not on file  Stress: Not on file  Social Connections: Not on file  Intimate Partner Violence: Not on file    Family History:   History reviewed. No pertinent family history.   ROS:  Please see the history of present illness.   All other ROS reviewed and negative.     Physical Exam/Data:   Vitals:   06/11/21 0447 06/11/21 0603 06/11/21 0844 06/11/21 1203  BP: (!) 151/74 (!) 152/69    Pulse: 80     Resp: 16     Temp:      TempSrc:      SpO2: 99%  100% 100%  Weight:      Height:        Intake/Output Summary (Last 24 hours) at 06/11/2021 1315 Last data filed at 06/11/2021 0700 Gross per 24 hour  Intake 2680 ml  Output 1550 ml  Net 1130 ml   Last 3 Weights 06/08/2021 06/07/2021 06/06/2021  Weight (lbs) 157 lb 10.1 oz 163 lb 5.8 oz 168 lb 14 oz  Weight (kg) 71.5 kg 74.1 kg 76.6 kg     Body mass index is  26.23 kg/m.  General: Thin frail African-American female HEENT: Trach present, Feeding tube in place Lymph: no adenopathy Neck: no JVD Endocrine:  No thryomegaly Vascular: No carotid bruits Cardiac:  normal S1, S2; RRR; no murmur  Lungs:  clear to auscultation bilaterally, no wheezing, rhonchi or rales  Abd: distended, slightly tender Ext: no edema Musculoskeletal:  No deformities, BUE and BLE strength normal and equal Skin: warm and dry  Neuro:  CNs 2-12 intact, no focal abnormalities noted Psych:  Normal affect   EKG:  The EKG was personally reviewed and demonstrates:  SR, 89 bpm   Relevant CV Studies:  Echo: 05/16/21  IMPRESSIONS     1. Left ventricular ejection fraction, by estimation, is 55 to 60%. The  left ventricle has normal function. The left ventricle has no regional  wall motion abnormalities. There is moderate concentric left ventricular  hypertrophy. Left ventricular  diastolic parameters are consistent with Grade II diastolic dysfunction  (pseudonormalization). Elevated left atrial pressure.   2. Right ventricular systolic function is normal. The right ventricular  size is normal. Mildly increased right ventricular wall thickness. There  is moderately elevated pulmonary artery systolic pressure. The estimated  right ventricular systolic pressure   is 37.0 mmHg.   3. Left atrial size was severely dilated.   4. Right atrial size was severely dilated.   5. There is no evidence of cardiac tamponade.   6. The mitral valve is normal in structure. Trivial mitral valve  regurgitation. No evidence of mitral stenosis.   7. The aortic valve is normal in structure. There is mild calcification  of the aortic valve. There is mild thickening of the aortic valve. Aortic  valve regurgitation is not visualized. Mild aortic valve sclerosis is  present, with no evidence of aortic  valve stenosis.   8. The inferior vena cava is dilated in size with <50% respiratory   variability, suggesting right atrial pressure of 15 mmHg.   Comparison(s): No prior  Echocardiogram. Findings are suspicious for  infiltrative cardiomyopathy such as amyloidosis.   Laboratory Data:  High Sensitivity Troponin:  No results for input(s): TROPONINIHS in the last 720 hours.   Chemistry Recent Labs  Lab 06/06/21 0329  NA 137  K 4.8  CL 106  CO2 19*  GLUCOSE 113*  BUN 23*  CREATININE 1.07*  CALCIUM 9.3  GFRNONAA >60  ANIONGAP 12    No results for input(s): PROT, ALBUMIN, AST, ALT, ALKPHOS, BILITOT in the last 168 hours. Hematology Recent Labs  Lab 06/05/21 1345 06/06/21 0329 06/07/21 1050  WBC 8.7 8.1 9.0  RBC 3.63* 3.42* 3.72*  HGB 8.5* 8.1* 8.9*  HCT 28.7* 27.4* 30.2*  MCV 79.1* 80.1 81.2  MCH 23.4* 23.7* 23.9*  MCHC 29.6* 29.6* 29.5*  RDW 32.2* 32.3* Not Measured  PLT 611* 547* 531*   BNPNo results for input(s): BNP, PROBNP in the last 168 hours.  DDimer No results for input(s): DDIMER in the last 168 hours.   Radiology/Studies:  No results found.   Assessment and Plan:   Holly Bradley is a 45 y.o. 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 Aileen Fass.  Pre op evaluation: she has extensive cardiac hx, following in the AHF clinic. Actually planned for further work up for possible amyloid once her uterine bleeding was better controlled. Echo this admission actually showed improved EF from prior 20-25% (1/22) to 55-60%, G2DD severe biatrial enlargement. No significant volume overload on exam. Given her PMH would consider her a moderate/intermediate pre op risk. Do not anticipate further cardiac work up at this time. Would monitor volume status closely in intra-post op period. Continue on telemetry monitoring for arrhythmias.    Nonischemic cardiomyopathy: History of combined heart failure with biventricular failure.  Previous EF earlier this year noted at 20 to 25%.  Echo this admission actually shows improvement in EF to 55 to 60%.  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  Followed by neurology. -- per neurology  Sepsis 2/2 MSSA bacteremia: followed by ID -- treated with antibiotics -- per primary  Uterine Fibroid: followed by GYN  Hx of SVT: on amiodarone $RemoveBefor'200mg'[id number]$  daily   For questions or updates, please contact [facility] Please consult [email or web address] for contact info under    Signed, Reino Bellis, NP  06/11/2021 1:15 PM  Patient seen and examined with Reino Bellis NP.  Agree as above, with the following exceptions and changes as noted below. The patient is sleeping but awakens due to coughing.  She is able to turn her head towards the direction of my voice and respond by shaking her head yes or no.  No current pain.  Unable to fully voice questions.  Gen: Appears mildly distressed, CV: RRR, no murmurs, Lungs: clear, Abd: Firm, Extrem: no edema, Neuro/Psych: Unable to assess with tracheostomy in place. All available labs, radiology testing, previous records reviewed.   She most recently had a preserved ejection fraction, does not appear to have any decompensated heart failure, and per internal medicine had no chest  pain on presentation.  The patient is intermediate risk for general anesthesia given prior history of reduced ejection fraction and possible cardiac amyloidosis that is currently undergoing evaluation.  No further cardiovascular testing is required prior to the procedure.  If this level of risk  is acceptable to the patient/patient's surrogate and surgical team, the patient should be considered optimized from a cardiovascular standpoint.  Recommend continuing heart failure therapy in the perioperative period.   Elouise Munroe, MD 06/11/21 5:12 PM

## 2021-06-11 NOTE — Progress Notes (Signed)
L rest hand brace placed on pt.

## 2021-06-11 NOTE — Progress Notes (Signed)
Nutrition Follow-up  DOCUMENTATION CODES:  Not applicable  INTERVENTION:  Continue TF via Cortrak: Jevity 1.5 @ 50 ml/hr ProSource TF BID 200 ml of free water QID  Use above intervention once pt has had PEG placement.  Recommend considering discontinuing Megace while pt is NPO as it may be contributing to pt's feelings of hunger.  NUTRITION DIAGNOSIS:  Inadequate oral intake related to inability to eat as evidenced by NPO status. - ongoing  GOAL:  Patient will meet greater than or equal to 90% of their needs - met with TF  MONITOR:  TF tolerance, Labs  REASON FOR ASSESSMENT:  Consult Enteral/tube feeding initiation and management  ASSESSMENT:  Pt with PMH of uncontrolled HTN, tobacco abuse, uterine fibroids, CHF, paroxysmal SVT, and paranoid psychosis admitted with significant encephalopathy and L sided weakness. 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 5/25 pt failed swallow eval; cortrak placed with tip gastric 5/26 pt intubated 5/30 positive for staph Epi. 6/3 s/p trach placement 6/4 tolerating PSV/CPAP 6/5 WBC up to 17, last day of ancef for staph PNA, trying ATC 6/9 ID consulted for staph epi bacteremia, felt contamination and stopped abx 6/12 fever spikes, origin unknown 6/13 change to cuffless trach 6/20 SLP recommends NPO  Current TF regimen: Jevity 1.5 @ 50 ml/hr ProSource TF BID Provides 1880 kcal, 98 gm protein, 912 ml free water daily 200 ml of free water QID  RD working remotely. Per MD notes, pt continues to tolerate TF regimen. Plan for PEG later this week pending cardiac evaluation.   Admit wt: 72.6 kg Current wt: 71.5 kg  RD continues to recommend considering discontinuing Megace while pt is NPO as it may be contributing to pt's feelings of hunger.  Medications: reviewed; ferrous sulfate BID, Megace TID, MVI with minerals, Protonix, miralax BID  Labs: reviewed; CBG 103-122  Diet  Order:   Diet Order             Diet NPO time specified  Diet effective midnight                  EDUCATION NEEDS:  No education needs have been identified at this time  Skin:  Skin Assessment: Reviewed RN Assessment  Last BM:  06/08/21  Height:  Ht Readings from Last 1 Encounters:  05/29/21 '5\' 5"'  (1.651 m)   Weight:  Wt Readings from Last 1 Encounters:  06/08/21 71.5 kg   BMI:  Body mass index is 26.23 kg/m.  Estimated Nutritional Needs:  Kcal:  1850-2100 Protein:  90-105 grams Fluid:  >1.8 L/day  Derrel Nip, RD, LDN Registered Dietitian I After-Hours/Weekend Pager # [phone]

## 2021-06-11 NOTE — Progress Notes (Signed)
  Speech Language Pathology Treatment: Dysphagia  Patient Details Name: Holly Bradley MRN: [id number] DOB: [birth year] Today's Date: 06/11/2021 Time: 7681-1572 SLP Time Calculation (min) (ACUTE ONLY): 10 min  Assessment / Plan / Recommendation Clinical Impression  Pt tolerated PMV well with full supervision, with no changes in VS; however, there was no volitional speech, only some passive voicing upon exhalation.  She followed simple commands in context.  She participated in oral care, brushing her teeth/tongue with min verbal cues and support for her right UE.  (Assisted by PT, with whom SLP co-treated.) Despite max cues, intentional speech could not be elicited.    Ice chips were provided -pt demonstrated difficulty retaining material in oral cavity.  With lip seal assistance, she masticated and initiated a swallow response, followed by intermittent coughing.  Recommend proceeding with MBS next date to determine physiology and any potential to take POs, even if limited or for the purposes of therapy.  Will follow. Shared plan with Dr. Aileen Fass and Rubie Maid, PA.   HPI HPI: Pt is a [name] who presents with AMS and slurred speech after being found down. MRI showing moderately large acute right MCA infarct with associated petechial hemorrhage, SAH, and minimal IVH. S/p mechanical thrombectomy of M1/M2/M3 and embolization of right MCA M3 branch. Significant PMH: uncontrolled HTN, tobacco use disorder, HF, paroxysmal SVT, paranoid psychosis. Required intubation s/p trach 6/3, changed to cuffless 6/16. Pt pending PEG however large uterine mass disrupting planned placement, and goals of care are unclear at this time      SLP Plan  Continue with current plan of care       Recommendations  Diet recommendations: NPO      Patient may use Passy-Muir Speech Valve: Intermittently with supervision;During all therapies with supervision PMSV Supervision: Full         Oral Care  Recommendations: Oral care QID SLP Visit Diagnosis: Dysphagia, unspecified (R13.10) Plan: Continue with current plan of care       GO               Holly Bradley L. [name], [place] Office number [phone] Pager [phone]  Holly Bradley 06/11/2021, 2:10 PM

## 2021-06-12 ENCOUNTER — Inpatient Hospital Stay (HOSPITAL_COMMUNITY): Payer: Medicaid Other

## 2021-06-12 DIAGNOSIS — I63411 Cerebral infarction due to embolism of right middle cerebral artery: Secondary | ICD-10-CM

## 2021-06-12 DIAGNOSIS — I5022 Chronic systolic (congestive) heart failure: Secondary | ICD-10-CM | POA: Diagnosis not present

## 2021-06-12 DIAGNOSIS — G936 Cerebral edema: Secondary | ICD-10-CM | POA: Diagnosis not present

## 2021-06-12 DIAGNOSIS — J9601 Acute respiratory failure with hypoxia: Secondary | ICD-10-CM | POA: Diagnosis not present

## 2021-06-12 LAB — GLUCOSE, CAPILLARY
Glucose-Capillary: 109 mg/dL — ABNORMAL HIGH (ref 70–99)
Glucose-Capillary: 111 mg/dL — ABNORMAL HIGH (ref 70–99)
Glucose-Capillary: 112 mg/dL — ABNORMAL HIGH (ref 70–99)
Glucose-Capillary: 112 mg/dL — ABNORMAL HIGH (ref 70–99)
Glucose-Capillary: 115 mg/dL — ABNORMAL HIGH (ref 70–99)
Glucose-Capillary: 121 mg/dL — ABNORMAL HIGH (ref 70–99)

## 2021-06-12 MED ORDER — FOOD THICKENER (SIMPLYTHICK)
10.0000 | ORAL | Status: DC | PRN
  Filled 2021-06-12: qty 10

## 2021-06-12 NOTE — Progress Notes (Signed)
Modified Barium Swallow Progress Note  Patient Details  Name: Holly Bradley MRN: [id number] Date of Birth: 13-Jul-1976  Today's Date: 06/12/2021  Modified Barium Swallow completed.  Full report located under Chart Review in the Imaging Section.  Brief recommendations include the following:  Clinical Impression  Pt presented with a much more successful swallow than anticipated -  her dysphagia is relatively mild. PMV was in place for the study; she followed all commands; was unable to communicate verbally but used gestures/facial expressions.   Pt demonstrated mild deficits with oral control, lip seal on left, and premature loss of thin liquids over base of tongue. Thin liquids reached the pyriform sinuses and occasionally a trace amount spilled into the larynx/trachea PRIOR TO swallow onset. There was no immediate cough response - after 30 seconds a cough occurred.   Pt protected her airway reliably with nectar thick liquids, purees, and a cracker - there was consistent laryngeal vestibule closure (no penetration/aspiration).  Also notable was strong pharyngeal squeeze with no residue remaining in pharynx after the swallows.    Recommend beginning an oral diet and allowing [name] an opportunity to [place].   REC: dysphagia 2; nectar thick liquids; meds crushed in puree.  Pt will need to be sitting as upright as possible; fully supervise all PO intake and assist her with self-feeding as able.  Reduce TF per dietician.  Use PMV with PO intake.  SLP will follow for safety/diet progression. D/W team.    Swallow Evaluation Recommendations       SLP Diet Recommendations: Dysphagia 2 (Fine chop) solids;Nectar thick liquid   Liquid Administration via: Cup;Straw   Medication Administration: Crushed with puree   Supervision: Full assist for feeding;Staff to assist with self feeding   Compensations: Minimize environmental distractions   Postural Changes: Seated upright at 90  degrees   Oral Care Recommendations: Oral care BID   Other Recommendations: Order thickener from [place]. [name], [place] Office number [phone] Pager [phone]   [name] 06/12/2021,2:20 PM

## 2021-06-12 NOTE — Progress Notes (Signed)
  Speech Language Pathology Treatment: Dysphagia  Patient Details Name: Holly Bradley MRN: [id number] DOB: 1975/12/26 Today's Date: 06/12/2021 Time: 4680-3212 SLP Time Calculation (min) (ACUTE ONLY): 25 min  Assessment / Plan / Recommendation Clinical Impression  F/u  for swallow tx.  Pt repositioned upright; PMV placed.  When asked if she wanted apple juice vs cranberry juice she reliably and consistently nodded her head "yes" to cranberry and "no" to apple.  With support to use straw, pt self-fed nectar-thick cranberry juice with occasional cues needed to generate negative pressure to use straw. There were no concerns for aspiration.  Pt followed one step commands and engaged particularly when on right side. Her brother entered room during session; we discussed results of MBS and our plan to begin an oral diet. We also discussed the plan to hold on PEG until we can determine if PO intake will be sufficient to meet her needs. Her brother verbalized understanding.  SLP will follow.   HPI HPI: Pt is a [name] who presented with AMS and slurred speech after being found down. MRI showing moderately large acute right MCA infarct with associated petechial hemorrhage, SAH, and minimal IVH. S/p mechanical thrombectomy of M1/M2/M3 and embolization of right MCA M3 branch. Significant PMH: uncontrolled HTN, tobacco use disorder, HF, paroxysmal SVT, paranoid psychosis. Required intubation s/p trach 6/3, changed to cuffless 6/16. Pt pending PEG however large uterine mass disrupting planned placement, and goals of care are unclear at this time      SLP Plan  Continue with current plan of care       Recommendations  Diet recommendations: Dysphagia 2 (fine chop);Nectar-thick liquid Liquids provided via: Straw;Cup Medication Administration: Crushed with puree Supervision: Staff to assist with self feeding;Full supervision/cueing for compensatory strategies Compensations: Minimize environmental  distractions                Oral Care Recommendations: Oral care BID SLP Visit Diagnosis: Dysphagia, oropharyngeal phase (R13.12) Plan: Continue with current plan of care       GO              Jannessa Ogden L. [name], [place] Office number [phone] Pager [phone]   [name] 06/12/2021, 2:59 PM

## 2021-06-12 NOTE — Progress Notes (Signed)
[facility] Progress Note  28 Days Post-Op  Subjective: CC:  Resting comfortably. Nods to yes and no questions.  Objective: Vital signs in last 24 hours: Temp:  [98.9 F (37.2 C)-99.7 F (37.6 C)] 99.3 F (37.4 C) (06/21 0836) Pulse Rate:  [79-87] 81 (06/21 0836) Resp:  [12-20] 18 (06/21 0836) BP: (105-142)/(60-73) 137/73 (06/21 0836) SpO2:  [98 %-100 %] 99 % (06/21 0836) FiO2 (%):  [21 %] 21 % (06/21 0805) Weight:  [71.1 kg] 71.1 kg (06/21 0458) Last BM Date: 06/11/21  Intake/Output from previous day: 06/20 0701 - 06/21 0700 In: 10 [I.V.:10] Out: 2200 [Urine:2200] Intake/Output this shift: Total I/O In: 50 [NG/GT:50] Out: 550 [Urine:550]  PE: Head: cortrak in R nare w/ TF running at 50 cc/hr Neck: trach in place Abd: Soft, non-tender, non-distended, large, palpable mass from pelvis up past umbilicus right to subcostal margin   Lab Results:  No results for input(s): WBC, HGB, HCT, PLT in the last 72 hours.  BMET No results for input(s): NA, K, CL, CO2, GLUCOSE, BUN, CREATININE, CALCIUM in the last 72 hours.  PT/INR No results for input(s): LABPROT, INR in the last 72 hours. CMP     Component Value Date/Time   NA 137 06/06/2021 0329   K 4.8 06/06/2021 0329   CL 106 06/06/2021 0329   CO2 19 (L) 06/06/2021 0329   GLUCOSE 113 (H) 06/06/2021 0329   BUN 23 (H) 06/06/2021 0329   CREATININE 1.07 (H) 06/06/2021 0329   CALCIUM 9.3 06/06/2021 0329   PROT 6.1 (L) 06/04/2021 0457   ALBUMIN 2.0 (L) 06/04/2021 0457   AST 26 06/04/2021 0457   ALT 13 06/04/2021 0457   ALKPHOS 144 (H) 06/04/2021 0457   BILITOT 0.4 06/04/2021 0457   GFRNONAA >60 06/06/2021 0329   Lipase  No results found for: LIPASE     Studies/Results: No results found.  Anti-infectives: Anti-infectives (From admission, onward)    Start     Dose/Rate Route Frequency Ordered Stop   05/30/21 1515  ceFAZolin (ANCEF) IVPB 2g/100 mL premix  Status:  Discontinued        2 g 200  mL/hr over 30 Minutes Intravenous Every 8 hours 05/30/21 1415 05/31/21 1404   05/21/21 1100  ceFAZolin (ANCEF) IVPB 2g/100 mL premix        2 g 200 mL/hr over 30 Minutes Intravenous Every 8 hours 05/21/21 1030 05/28/21 0537        Assessment/Plan  Dysphagia Consult for PEG placement - tolerating TF via cortrak at 50 cc/hr and having BMs. - patient also with 22.9 x 22.9 x 13.8 cm uterine fibroid and abdomen firm on palpation - patient seems to be clinically improving from MSSA PNA and possible bacteremia, now off of abx and afebrile for about 48 hours. - failed swallow eval earlier in admission, s/p trach now but still has cuffed trach.  Plan for MBS today.  Will see how she does with this and pending that can plan for g-tube this week.  - Our service spoke with Dr. Olevia Bowens 6/16 whom stated the patients brother will have a final decision regarding G-tube.  - below per primary attending - R MCA CVA complicated by [facility] with midline shift and cerebral edema Acute on chronic anemia, multifactorial AKI S/P tracheostomy MSSA PNA - completed course of ancef x 7 days ?staph epidermis bacteremia - blood cx 5/30 positive for staph epidermis and staph hominis, per ID monitor off of abx for now, staph epi should have  been covered by 7d ancef.  Leukocytosis - resolved  T2DM controlled with diet/exercise Uncontrolled HTN HLD CHF with pEF SVT Uterine fibroids - with chronic vaginal bleeding, H&H stable 8.1/27.4 Hx of paranoid psychosis    LOS: 28 days   Nadeen Landau, MD [facility] Use [email or web address] to contact on call provider

## 2021-06-12 NOTE — Progress Notes (Signed)
Patient did well with her MBS today.  Being placed on a D2 diet.  Discussed with all members of the team that we will hold on g-tube placement and allow her to try and eat on her own.  We are available if needed.  Henreitta Cea 2:08 PM 06/12/2021

## 2021-06-12 NOTE — Progress Notes (Signed)
[facility] PROGRESS NOTE    Progress Note  Holly Bradley  DZH:[id number] DOB: 06-20-1976 DOA: 05/15/2021 PCP: Mitzi Hansen, MD     Brief Narrative:   Holly Bradley is an 45 y.o. female past medical history of essential hypertension, diabetes mellitus type 2 large uterine fibroids paroxysmal SVT, chronic diastolic heart failure paranoid psychosis comes into the ED on 05/16/2019 with confusion was found to have a right MCA occlusion underwent thrombectomy which was complicated by contrast extravasation and M3 coiling embolization and revascularization.  She subsequently developed subarachnoid hemorrhage repeat imaging revealed right basal ganglia infarct with a 3 mm left shift, she was treated with hypertonic saline in the ICU, on 05/17/2021 she developed respiratory distress and he was intubated CT of the head showed increased cytotoxic edema and left side shift.  No mental status improvement is now trach and with nasal collar track for tube feedings.  She has been treated with Ancef and completed a 7-day course for MSSA pneumonia, her blood cultures on 05/21/2021 were positive for staph epi.  Infectious disease was consulted and are on board she has remained off antibiotics since 2022 repeated blood cultures on 06/02/2021 have remained negative.  Significant Events: 5/24 Admit to neurology after ED visit with AMS, found to have R MCA stroke, underwent mechanical thrombectomy, M3 coil embolization, subsequent SAH 5/26 decreased mental status and respiratory distress, intubated 6/1 still not candidate for extubation with poor mentation and cough mechanics. Family unsure if pt would want trach. Staph epi bacteremia, continues on ancef 6/2 moving RUE RLE following some commands. decision to move forward with tracheostomy.   6/3 perc trach. Slight WBC increase but no fever, continues on ancef 6/4 Tolerating PSV/CPAP 6/5 WBC up to 17, last day of ancef for staph pna. Trying ATC   6/9 ID consulted for staph epi bacteremia . Felt contamination and stopped abx. 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 Stroke team on board, they relate that there was salts over hypercoagulable work-up revealed the presence of lupus, they do recommend repeating a hypercoagulable panel in 12 weeks. Continue aspirin and Lipitor. General surgery consulted for PEG tube placement she is moderate risk for cardiac or pulmonary complications.  Acute respiratory failure with hypoxia: Currently being followed by PCCM and approved skilled assistance. Continue routine trach care, not a candidate for decannulation. Okay to progress PMV. Pulmonary following for trach twice a day.  MSSA pneumonia: Completed 1 week course of antibiotics.  Sepsis due to staph epi there may to miss bacteremia: ID on board they recommended to hold on antibiotics as repeated blood cultures appear likely contaminant. Blood cultures have remained negative till date she has remained afebrile with no leukocytosis.  Thrombocytosis: Acting as an acute phase reactant platelets are improving.  Uterine fibroid: Extremely large palpable on exam OB/GYN was consulted they recommended to continue Megace. OB/GYN reevaluated.  They did not recommend hysterectomy at a more surgical remover due to her current medical condition.  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  Has remained relatively stable, will continue to monitor intermittently.  Nutrition: Continue tube feedings, through core track.  History of paroxysmal SVT: Continue home  amiodarone  dose.  Preop evaluation: Cardiology was consulted and deemed her intermediate risk for cardiopulmonary complications, they recommended no further cardiac work-up at this time. Recommended to monitor postop and intraoperative fluid volume. Monitor on telemetry for signs of arrhythmia.  Goals of care: She has a very poor prognosis the brother has unrealistic expectations about outcomes. Will ask palliative care to continue to work with them as the patient quality of life will be poor as she will be bedbound she has no higher level function at this time communication wise.   DVT prophylaxis: scd Family Communicationbrother Status is: Inpatient  Remains inpatient appropriate because:Hemodynamically unstable  Dispo: The patient is from: Home              Anticipated d/c is to: LTAC              Patient currently is not medically stable to d/c.   Difficult to place patient No        Code Status:     Code Status Orders  (From admission, onward)           Start     Ordered   05/30/21 1101  Limited resuscitation (code)  Continuous       Question Answer Comment  In the event of cardiac or respiratory ARREST: Initiate Code Blue, Call Rapid Response Yes   In the event of cardiac or respiratory ARREST: Perform CPR No   In the event of cardiac or respiratory ARREST: Perform Intubation/Mechanical Ventilation Yes   In the event of cardiac or respiratory ARREST: Use NIPPV/BiPAp only if indicated Yes   In the event of cardiac or respiratory ARREST: Administer ACLS medications if indicated Yes   In the event of cardiac or respiratory ARREST: Perform Defibrillation or Cardioversion if indicated Yes      05/30/21 1101           Code Status History     Date Active Date Inactive Code Status Order ID Comments User Context   05/15/2021 0754 05/30/2021 1101 Full Code [id number]  Greta Doom, MD ED         IV Access:   Peripheral IV   Procedures and diagnostic  studies:   No results found.   Medical Consultants:   None.   Subjective:    [name] nonverbal, lethargic.  Objective:    Vitals:   06/12/21 0458 06/12/21 0753 06/12/21 0805 06/12/21 0836  BP:  132/72  137/73  Pulse:  79  81  Resp:  17  18  Temp:    99.3 F (37.4 C)  TempSrc:    Oral  SpO2:  100% 99% 99%  Weight: 71.1 kg     Height:       SpO2: 99 % O2 Flow Rate (L/min): 5 L/min FiO2 (%): 21 %   Intake/Output Summary (Last 24 hours) at 06/12/2021 0916 Last data filed at 06/12/2021 0800 Gross per 24 hour  Intake 60 ml  Output 2750 ml  Net -2690 ml    Filed Weights   06/07/21 0500 06/08/21 0428 06/12/21 0458  Weight: 74.1 kg 71.5 kg 71.1 kg    Exam: General exam: In no acute distress. Respiratory system: Good air movement and clear to auscultation. Cardiovascular system: S1 & S2 heard, RRR. No JVD. Gastrointestinal system: Abdomen is nondistended, soft and nontender.  Extremities: No pedal edema. Skin: No rashes, lesions or ulcers  Data Reviewed:    Labs: Basic Metabolic Panel: Recent Labs  Lab 06/06/21  0329  NA 137  K 4.8  CL 106  CO2 19*  GLUCOSE 113*  BUN 23*  CREATININE 1.07*  CALCIUM 9.3    GFR Estimated Creatinine Clearance: 64.2 mL/min (A) (by C-G formula based on SCr of 1.07 mg/dL (H)). Liver Function Tests: No results for input(s): AST, ALT, ALKPHOS, BILITOT, PROT, ALBUMIN in the last 168 hours.  No results for input(s): LIPASE, AMYLASE in the last 168 hours. No results for input(s): AMMONIA in the last 168 hours. Coagulation profile No results for input(s): INR, PROTIME in the last 168 hours. COVID-19 Labs  No results for input(s): DDIMER, FERRITIN, LDH, CRP in the last 72 hours.   Lab Results  Component Value Date   [place] NEGATIVE 05/15/2021    CBC: Recent Labs  Lab 06/05/21 1345 06/06/21 0329 06/07/21 1050  WBC 8.7 8.1 9.0  HGB 8.5* 8.1* 8.9*  HCT 28.7* 27.4* 30.2*  MCV 79.1* 80.1 81.2  PLT  611* 547* 531*    Cardiac Enzymes: No results for input(s): CKTOTAL, CKMB, CKMBINDEX, TROPONINI in the last 168 hours. BNP (last 3 results) No results for input(s): PROBNP in the last 8760 hours. CBG: Recent Labs  Lab 06/11/21 2031 06/11/21 2054 06/12/21 0026 06/12/21 0427 06/12/21 0858  GLUCAP 108* 106* 115* 111* 112*    D-Dimer: No results for input(s): DDIMER in the last 72 hours.  Hgb A1c: No results for input(s): HGBA1C in the last 72 hours. Lipid Profile: No results for input(s): CHOL, HDL, LDLCALC, TRIG, CHOLHDL, LDLDIRECT in the last 72 hours. Thyroid function studies: No results for input(s): TSH, T4TOTAL, T3FREE, THYROIDAB in the last 72 hours.  Invalid input(s): FREET3 Anemia work up: No results for input(s): VITAMINB12, FOLATE, FERRITIN, TIBC, IRON, RETICCTPCT in the last 72 hours. Sepsis Labs: Recent Labs  Lab 06/05/21 1345 06/06/21 0329 06/07/21 1050  WBC 8.7 8.1 9.0    Microbiology Recent Results (from the past 240 hour(s))  Culture, blood (Routine X 2) w Reflex to ID Panel     Status: None   Collection Time: 06/02/21  2:17 PM   Specimen: BLOOD LEFT HAND  Result Value Ref Range Status   Specimen Description BLOOD LEFT HAND  Final   Special Requests   Final    BOTTLES DRAWN AEROBIC AND ANAEROBIC Blood Culture adequate volume   Culture   Final    NO GROWTH 5 DAYS Performed at [facility], [place] [address] [place], [place] [address]    Report Status 06/07/2021 FINAL  Final  Culture, blood (Routine X 2) w Reflex to ID Panel     Status: None   Collection Time: 06/02/21  2:22 PM   Specimen: BLOOD  Result Value Ref Range Status   Specimen Description BLOOD LEFT ANTECUBITAL  Final   Special Requests   Final    BOTTLES DRAWN AEROBIC AND ANAEROBIC Blood Culture adequate volume   Culture   Final    NO GROWTH 5 DAYS Performed at [facility], [place] [address], [place], [place] [address]    Report Status 06/07/2021 FINAL  Final      Medications:    amiodarone  200 mg Per Tube Daily   amLODipine  10 mg Per Tube Daily   aspirin  81 mg Per Tube Daily   atorvastatin  40 mg Per Tube Daily   carvedilol  25 mg Per Tube BID WC   chlorhexidine gluconate (MEDLINE KIT)  15 mL Mouth Rinse BID   Chlorhexidine Gluconate Cloth  6 each Topical Daily   cloNIDine  0.1 mg Per Tube TID   feeding supplement (PROSource TF)  45 mL Per Tube BID   ferrous sulfate  300 mg Per Tube BID WC   free water  200 mL Per Tube Q6H   hydrALAZINE  100 mg Per Tube Q8H   isosorbide dinitrate  40 mg Per Tube TID   mouth rinse  15 mL Mouth Rinse 10 times per day   megestrol  80 mg Per Tube TID   multivitamin with minerals  1 tablet Per Tube Daily   pantoprazole sodium  40 mg Per Tube Daily   polyethylene glycol  17 g Oral BID   sacubitril-valsartan  1 tablet Per Tube BID   sodium chloride flush  10-40 mL Intracatheter Q12H   Continuous Infusions:  feeding supplement (JEVITY 1.5 CAL/FIBER) 1,000 mL (06/12/21 0554)      LOS: 28 days   Charlynne Cousins  [facility]  06/12/2021, 9:16 AM

## 2021-06-12 NOTE — Progress Notes (Signed)
Patient not in the room at this time. Will continue CPT and trach care next rounds.

## 2021-06-13 DIAGNOSIS — J9601 Acute respiratory failure with hypoxia: Secondary | ICD-10-CM | POA: Diagnosis not present

## 2021-06-13 DIAGNOSIS — I5022 Chronic systolic (congestive) heart failure: Secondary | ICD-10-CM | POA: Diagnosis not present

## 2021-06-13 DIAGNOSIS — I63411 Cerebral infarction due to embolism of right middle cerebral artery: Secondary | ICD-10-CM | POA: Diagnosis not present

## 2021-06-13 DIAGNOSIS — G936 Cerebral edema: Secondary | ICD-10-CM | POA: Diagnosis not present

## 2021-06-13 LAB — GLUCOSE, CAPILLARY
Glucose-Capillary: 114 mg/dL — ABNORMAL HIGH (ref 70–99)
Glucose-Capillary: 115 mg/dL — ABNORMAL HIGH (ref 70–99)
Glucose-Capillary: 116 mg/dL — ABNORMAL HIGH (ref 70–99)
Glucose-Capillary: 116 mg/dL — ABNORMAL HIGH (ref 70–99)
Glucose-Capillary: 116 mg/dL — ABNORMAL HIGH (ref 70–99)
Glucose-Capillary: 117 mg/dL — ABNORMAL HIGH (ref 70–99)
Glucose-Capillary: 118 mg/dL — ABNORMAL HIGH (ref 70–99)
Glucose-Capillary: 120 mg/dL — ABNORMAL HIGH (ref 70–99)

## 2021-06-13 MED ORDER — JEVITY 1.5 CAL/FIBER PO LIQD
900.0000 mL | ORAL | Status: DC
  Administered 2021-06-13 – 2021-06-20 (×8): 900 mL
  Filled 2021-06-13 (×2): qty 1000
  Filled 2021-06-13 (×2): qty 948
  Filled 2021-06-13: qty 1000
  Filled 2021-06-13: qty 948
  Filled 2021-06-13 (×2): qty 1000
  Filled 2021-06-13 (×3): qty 948
  Filled 2021-06-13: qty 1000

## 2021-06-13 MED ORDER — PROSOURCE TF PO LIQD
45.0000 mL | Freq: Every day | ORAL | Status: DC
  Administered 2021-06-14 – 2021-06-21 (×7): 45 mL
  Filled 2021-06-13 (×8): qty 45

## 2021-06-13 MED ORDER — KETOROLAC TROMETHAMINE 15 MG/ML IJ SOLN
15.0000 mg | Freq: Four times a day (QID) | INTRAMUSCULAR | Status: DC | PRN
  Administered 2021-06-13 – 2021-06-16 (×2): 15 mg via INTRAVENOUS
  Filled 2021-06-13 (×2): qty 1

## 2021-06-13 MED ORDER — TRAMADOL HCL 50 MG PO TABS
50.0000 mg | ORAL_TABLET | Freq: Four times a day (QID) | ORAL | Status: DC | PRN
  Administered 2021-06-13 – 2021-06-20 (×11): 50 mg
  Filled 2021-06-13 (×12): qty 1

## 2021-06-13 NOTE — Progress Notes (Signed)
Nutrition Follow-up  DOCUMENTATION CODES:  Not applicable  INTERVENTION:  -Initiate 48 hour calorie count -Magic cup TID with meals, each supplement provides 290 kcal and 9 grams of protein -Make inappropriate for room service   Transition to nocturnal TF via Cortrak: Jevity 1.5 @ 75 ml/hr administered over 12 hours from 1800-0600 ProSource TF daily 200 ml of free water QID  Nocturnal TF regimen provides 1390 kcals, 68g protein, 6109m free water (1483mtotal free water with flushes) Meets ~75% minimum estimated calorie and protein needs  NUTRITION DIAGNOSIS:  Inadequate oral intake related to inability to eat as evidenced by NPO status. - ongoing  GOAL:  Patient will meet greater than or equal to 90% of their needs - met with TF  MONITOR:  TF tolerance, Labs  REASON FOR ASSESSMENT:  Consult Enteral/tube feeding initiation and management  ASSESSMENT:  Pt with PMH of uncontrolled HTN, tobacco abuse, uterine fibroids, CHF, paroxysmal SVT, and paranoid psychosis admitted with significant encephalopathy and L sided weakness. 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  5/25 pt failed swallow eval; cortrak placed with tip gastric 5/26 pt intubated 5/30 positive for staph Epi. 6/3 s/p trach placement 6/4 tolerating PSV/CPAP 6/5 WBC up to 17, last day of ancef for staph PNA, trying ATC 6/9 ID consulted for staph epi bacteremia, felt contamination and stopped abx 6/12 fever spikes, origin unknown 6/13 change to cuffless trach 6/20 SLP recommends NPO  SLP advanced pt's diet to dysphagia 1 with nectar thick liquids and requested TF rate be decreased to stimulate pt's appetite. Will transition pt to nocturnal TF and initiate calorie count to determine pt's ability to adequately meet needs po. If pt's intake is inadequate, pt will need PEG (currently large uterine mass disrupting planned placement, GOC unclear at this time).    Current TF regimen: Jevity 1.5 @ 50 ml/hr w/ 4574mroSource TF BID and 200m56mee water QID. Provides 1880 kcal, 98 gm protein, 912 ml free water daily  Admit wt: 72.6 kg Current wt: 71.1 kg  UOP: 1150ml22m unmeasured urinary occurrence x24 hours Stool: 1x unmeasured occurrence x24 hours  Medications: ferrous sulfate BID, Megace TID, MVI with minerals, Protonix Labs reviewed.  CBGs [id number] Order:   Diet Order             DIET - DYS 1 Room service appropriate? Yes; Fluid consistency: Nectar Thick  Diet effective now                  EDUCATION NEEDS:  No education needs have been identified at this time  Skin:  Skin Assessment: Reviewed RN Assessment  Last BM:  6/22 type 6  Height:  Ht Readings from Last 1 Encounters:  05/29/21 '5\' 5"'  (1.651 m)   Weight:  Wt Readings from Last 1 Encounters:  06/12/21 71.1 kg   BMI:  Body mass index is 26.08 kg/m.  Estimated Nutritional Needs:  Kcal:  1850-2100 Protein:  90-105 grams Fluid:  >1.8 L/day  AmandLarkin Ina RD, LDN (she/her/hers) RD pager number and weekend/on-call pager number located in [facility]

## 2021-06-13 NOTE — Progress Notes (Signed)
Daily Progress Note   [name]       Date: 06/13/2021 DOB: March 04, 1976  Age: 45 y.o. MRN#: [id number] Attending Physician: Charlynne Cousins, MD Primary Care Physician: Mitzi Hansen, MD Admit Date: 05/15/2021  Reason for Consultation/Follow-up: Establishing goals of care  Subjective: RN at bedside - reports Holly Bradley has been c/o L arm pain.  Holly Bradley acknowledges me and answers questions appropriately.   Length of Stay: 29  Current Medications: Scheduled Meds:  . amiodarone  200 mg Per Tube Daily  . amLODipine  10 mg Per Tube Daily  . aspirin  81 mg Per Tube Daily  . atorvastatin  40 mg Per Tube Daily  . carvedilol  25 mg Per Tube BID WC  . chlorhexidine gluconate (MEDLINE KIT)  15 mL Mouth Rinse BID  . Chlorhexidine Gluconate Cloth  6 each Topical Daily  . cloNIDine  0.1 mg Per Tube TID  . feeding supplement (PROSource TF)  45 mL Per Tube BID  . ferrous sulfate  300 mg Per Tube BID WC  . free water  200 mL Per Tube Q6H  . hydrALAZINE  100 mg Per Tube Q8H  . isosorbide dinitrate  40 mg Per Tube TID  . mouth rinse  15 mL Mouth Rinse 10 times per day  . megestrol  80 mg Per Tube TID  . multivitamin with minerals  1 tablet Per Tube Daily  . pantoprazole sodium  40 mg Per Tube Daily  . sacubitril-valsartan  1 tablet Per Tube BID  . sodium chloride flush  10-40 mL Intracatheter Q12H    Continuous Infusions: . feeding supplement (JEVITY 1.5 CAL/FIBER) 1,000 mL (06/13/21 0850)    PRN Meds: acetaminophen **OR** acetaminophen (TYLENOL) oral liquid 160 mg/5 mL **OR** acetaminophen, food thickener, loperamide HCl, simethicone, sodium chloride flush, traMADol  Physical Exam Constitutional:      General: She is not in acute distress.    Comments: Interacts appropriately  with head nods/shake and gestures  Cardiovascular:     Rate and Rhythm: Normal rate and regular rhythm.  Pulmonary:     Effort: Pulmonary effort is normal.  Abdominal:     Palpations: There is mass.  Skin:    General: Skin is warm and dry.            Vital Signs: BP 118/67   Pulse 79   Temp 98 F (36.7 C) (Oral)   Resp 18   Ht '5\' 5"'  (1.651 m)   Wt 71.1 kg   LMP  (LMP Unknown) Comment: pt not alert  SpO2 99%   BMI 26.08 kg/m  SpO2: SpO2: 99 % O2 Device: O2 Device: Tracheostomy Collar O2 Flow Rate: O2 Flow Rate (L/min): 5 L/min  Intake/output summary:   Intake/Output Summary (Last 24 hours) at 06/13/2021 1444 Last data filed at 06/13/2021 0900 Gross per 24 hour  Intake 682 ml  Output 600 ml  Net 82 ml    LBM: Last BM Date: 06/13/21 Baseline Weight: Weight: 72.6 kg Most recent weight: Weight: 71.1 kg      Flowsheet Rows    Flowsheet Row Most Recent Value  Intake Tab   Referral Department Critical care  Unit at Time  of Referral ICU  Palliative Care Primary Diagnosis Neurology  Date Notified 05/22/21  Palliative Care Type New Palliative care  Reason for referral Clarify Goals of Care  Date of Admission 05/15/21  Date first seen by Palliative Care 05/23/21  # of days Palliative referral response time 1 Day(s)  # of days IP prior to Palliative referral 7  Clinical Assessment   Psychosocial & Spiritual Assessment   Palliative Care Outcomes        Patient Active Problem List   Diagnosis Date Noted  . Status post tracheostomy ([place])   . Abnormal uterine bleeding (AUB)   . Abdominal distention   . Encounter for feeding tube placement   . Encounter for intubation   . Ileus ([place])   . Staphylococcus epidermidis bacteremia   . Fibroid 05/25/2021  . Iron deficiency anemia due to chronic blood loss 05/25/2021  . Cerebral edema ([place]) 05/25/2021  . Chronic HFrEF (heart failure with reduced ejection fraction) ([place]) 05/25/2021  . Pneumonia due to methicillin  susceptible Staphylococcus aureus ([place]) 05/25/2021  . Acute respiratory failure with hypoxia ([place])   . Cerebrovascular accident (CVA) ([place]) 05/15/2021    Palliative Care Assessment & Plan   HPI: 45 y.o. female  with past medical history of uncontrolled HTN, tobacco use disorder, uterine fibroids, heart failure, paroxysmal SVT, paranoid psychosis admitted on 05/15/2021 with AMS. She was diagnosed with acute large MCA stroke with subarachnoid hemorrhage and midline shift requiring mechanical ventilation for airway protection and hyperosmolar therapy for cerebral edema. PMT consulted to discuss [place]. Lurline Idol was placed 6/3.   Assessment: Holly Bradley remains interactive with head nods and shakes; gestures with R arm. She indicates she feels okay today. Per RN, was c/o L arm pain. RN also reports good PO intake with breakfast  - did become tired at the end of meal. She nods when I ask if I ask if I can call her brother [name].   I call her brother Adolphus Birchwood is very pleased that Holly Bradley is eating on her own right now and g tube has not been placed. He visited with [facility] yesterday and was pleased with her level of interaction with him.   He again expresses frustration about Insurance underwriter - asks for assistance from Development worker, community. The was relayed to [facility].   [name] has my number and knows to call with any further questions or concerns.   Recommendations/Plan: Continue current plan of care No CPR  Goals of Care and Additional Recommendations: Limitations on Scope of Treatment: Full Scope Treatment  Code Status: Limited code - NO CPR  Prognosis:  Unable to determine  Discharge Planning: To Be Determined  Care plan was discussed with patient's brother  Thank you for allowing the Palliative Medicine Team to assist in the care of this patient.   Total Time 15 minutes Prolonged Time Billed  no    Greater than 50%  of this time was spent counseling and coordinating care related to the  above assessment and plan.   Juel Burrow, DNP, [facility] Palliative Medicine Team Team Phone # [phone]  Pager [phone]

## 2021-06-13 NOTE — Progress Notes (Signed)
[facility] PRO[facility]    Progress Note  Danaiya Steadman  LOV:[id number] DOB: [birth year] DOA: 05/15/2021 PCP: Mitzi Hansen, MD     Brief Narrative:   Talani Brazee is an 45 y.o. female past medical history of essential hypertension, diabetes mellitus type 2 large uterine fibroids paroxysmal SVT, chronic diastolic heart failure paranoid psychosis comes into the ED on 05/16/2019 with confusion was found to have a right MCA occlusion underwent thrombectomy which was complicated by contrast extravasation and M3 coiling embolization and revascularization.  She subsequently developed subarachnoid hemorrhage repeat imaging revealed right basal ganglia infarct with a 3 mm left shift, she was treated with hypertonic saline in the ICU, on 05/17/2021 she developed respiratory distress and he was intubated CT of the head showed increased cytotoxic edema and left side shift.  No mental status improvement is now trach and with nasal collar track for tube feedings.  She has been treated with Ancef and completed a 7-day course for MSSA pneumonia, her blood cultures on 05/21/2021 were positive for staph epi.  Infectious disease was consulted and are on board she has remained off antibiotics since 2022 repeated blood cultures on 06/02/2021 have remained negative.  Significant Events: 5/24 Admit to neurology after ED visit with AMS, found to have R MCA stroke, underwent mechanical thrombectomy, M3 coil embolization, subsequent SAH 5/26 decreased mental status and respiratory distress, intubated 6/1 still not candidate for extubation with poor mentation and cough mechanics. Family unsure if pt would want trach. Staph epi bacteremia, continues on ancef 6/2 moving RUE RLE following some commands. decision to move forward with tracheostomy.   6/3 perc trach. Slight WBC increase but no fever, continues on ancef 6/4 Tolerating PSV/CPAP 6/5 WBC up to 17, last day of ancef for staph pna. Trying ATC   6/9 ID consulted for staph epi bacteremia . Felt contamination and stopped abx. 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 Stroke team on board, they relate that there was salts over hypercoagulable work-up revealed the presence of lupus, they do recommend repeating a hypercoagulable panel in 12 weeks. Continue aspirin and Lipitor. Speech reevaluated the patient and she tolerated it well they recommended a dysphagia diet to, she has been tolerating this well hold on placing PEG tube and see how she does. She will need long-term care placement.  Acute respiratory failure with hypoxia: Currently being followed by PCCM and approved skilled assistance. Continue routine trach care, not a candidate for decannulation. Okay to progress PMV. Pulmonary following for trach twice a day.  MSSA pneumonia: Completed 1 week course of antibiotics.  Fever of unknown source: Cont. hold on antibiotics as blood cultures appear likely contaminant. Repeated Blood cultures have remained negative till date she has remained afebrile with no leukocytosis.  Thrombocytosis: Acting as an acute phase reactant platelets are improving.  Uterine fibroid: Extremely large palpable on exam OB/GYN was consulted they recommended to continue Megace. OB/GYN reevaluated.  They did not recommend hysterectomy due to her current medical condition.  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  Has remained relatively stable, will continue to monitor intermittently.  Nutrition: Continue tube feedings, through core track.  History of paroxysmal  SVT: Continue home  amiodarone dose.  Preop evaluation: Cardiology was consulted and deemed her intermediate risk for cardiopulmonary complications, they recommended no further cardiac work-up at this time. Recommended to monitor postop and intraoperative fluid volume. Monitor on telemetry for signs of arrhythmia.  Goals of care: She has a very poor prognosis the brother has unrealistic expectations about outcomes. Will ask palliative care to continue to work with them as the patient quality of life will be poor as she will be bedbound she has no higher level function at this time communication wise.   DVT prophylaxis: scd Family Communicationbrother Status is: Inpatient  Remains inpatient appropriate because:Hemodynamically unstable  Dispo: The patient is from: Home              Anticipated d/c is to: LTAC              Patient currently is not medically stable to d/c.   Difficult to place patient No        Code Status:     Code Status Orders  (From admission, onward)           Start     Ordered   05/30/21 1101  Limited resuscitation (code)  Continuous       Question Answer Comment  In the event of cardiac or respiratory ARREST: Initiate Code Blue, Call Rapid Response Yes   In the event of cardiac or respiratory ARREST: Perform CPR No   In the event of cardiac or respiratory ARREST: Perform Intubation/Mechanical Ventilation Yes   In the event of cardiac or respiratory ARREST: Use NIPPV/BiPAp only if indicated Yes   In the event of cardiac or respiratory ARREST: Administer ACLS medications if indicated Yes   In the event of cardiac or respiratory ARREST: Perform Defibrillation or Cardioversion if indicated Yes      05/30/21 1101           Code Status History     Date Active Date Inactive Code Status Order ID Comments User Context   05/15/2021 0754 05/30/2021 1101 Full Code [id number]  Greta Doom, MD ED         IV Access:   Peripheral IV   Procedures and  diagnostic studies:   DG Swallowing Func-Speech Pathology  Result Date: 06/12/2021 Formatting of this result is different from the original. Objective Swallowing Evaluation: Type of Study: MBS-Modified Barium Swallow Study  Patient Details Name: Erial Fikes MRN: [id number] Date of Birth: 1976/05/27 Today's Date: 06/12/2021 Time: SLP Start Time (ACUTE ONLY): 35 -SLP Stop Time (ACUTE ONLY): 1310 SLP Time Calculation (min) (ACUTE ONLY): 40 min Past Medical History: Past Medical History: Diagnosis Date  Hypertension  Past Surgical History: Past Surgical History: Procedure Laterality Date  IR ANGIOGRAM FOLLOW UP STUDY  05/15/2021  IR CT HEAD LTD  05/15/2021  IR PERCUTANEOUS ART THROMBECTOMY/INFUSION INTRACRANIAL INC DIAG ANGIO  05/15/2021     IR PERCUTANEOUS ART THROMBECTOMY/INFUSION INTRACRANIAL INC DIAG ANGIO  05/15/2021  IR TRANSCATH/EMBOLIZ  05/15/2021  RADIOLOGY WITH ANESTHESIA N/A 05/15/2021  Procedure: IR WITH ANESTHESIA;  Surgeon: Radiologist, Medication, MD;  Location: [facility];  Service: Radiology;  Laterality: N/A; HPI: Pt is a [name] who presented with AMS and slurred speech after being found down. MRI showing moderately large acute right MCA infarct with associated petechial hemorrhage, SAH, and minimal IVH. S/p mechanical thrombectomy of M1/M2/M3 and embolization of right MCA M3 branch. Significant PMH: uncontrolled HTN, tobacco use disorder, HF, paroxysmal SVT, paranoid psychosis. Required intubation s/p  trach 6/3, changed to cuffless 6/16. Pt pending PEG however large uterine mass disrupting planned placement, and goals of care are unclear at this time  Subjective: Alert and participatory Assessment / Plan / Recommendation CHL IP CLINICAL IMPRESSIONS 06/12/2021 Clinical Impression Pt presented with a much more successful swallow than anticipated -  her dysphagia is relatively mild.  PMV was in place for the study; she followed all commands; was unable to communicate verbally but used gestures/facial  expressions. Pt demonstrated mild deficits with oral control, lip seal on left, and premature loss of thin liquids over base of tongue. Thin liquids reached the pyriform sinuses and occasionally a trace amount spilled into the larynx PRIOR TO swallow onset - there was no immediate cough response, but after 30 seconds a cough occurred. Pt protected her airway reliably with nectar thick liquids, purees, and a cracker - there was consistent laryngeal vestibule closure (no penetration/aspiration).  Also notable was strong pharyngeal squeeze with no residue remaining in pharynx after the swallows.  Recommend beginning an oral diet and allowing Ms. Yasin an opportunity to [place].  REC: dysphagia 2; nectar thick liquids; meds crushed in puree.  Pt will need to be sitting as upright as possible; fully supervise all PO intake and assist her with self-feeding as able.  Reduce TF per dietician.  Use PMV with PO intake.  SLP will follow for safety/diet progression. SLP Visit Diagnosis Dysphagia, oropharyngeal phase (R13.12) Attention and concentration deficit following -- Frontal lobe and executive function deficit following -- Impact on safety and function Mild aspiration risk   CHL IP TREATMENT RECOMMENDATION 06/12/2021 Treatment Recommendations Therapy as outlined in treatment plan below   Prognosis 06/12/2021 Prognosis for Safe Diet Advancement Good Barriers to Reach Goals -- Barriers/Prognosis Comment -- CHL IP DIET RECOMMENDATION 06/12/2021 SLP Diet Recommendations Dysphagia 2 (Fine chop) solids;Nectar thick liquid Liquid Administration via Cup;Straw Medication Administration Crushed with puree Compensations Minimize environmental distractions Postural Changes Seated upright at 90 degrees   CHL IP OTHER RECOMMENDATIONS 06/12/2021 Recommended Consults -- Oral Care Recommendations Oral care BID Other Recommendations Order thickener from pharmacy   CHL IP FOLLOW UP RECOMMENDATIONS 06/08/2021 Follow up Recommendations  Inpatient Rehab;Skilled Nursing facility   [facility] IP FREQUENCY AND DURATION 06/12/2021 Speech Therapy Frequency (ACUTE ONLY) min 3x week Treatment Duration 2 weeks      CHL IP ORAL PHASE 06/12/2021 Oral Phase Impaired Oral - Pudding Teaspoon -- Oral - Pudding Cup -- Oral - Honey Teaspoon -- Oral - Honey Cup -- Oral - Nectar Teaspoon -- Oral - Nectar Cup -- Oral - Nectar Straw -- Oral - Thin Teaspoon -- Oral - Thin Cup -- Oral - Thin Straw -- Oral - Puree -- Oral - Mech Soft -- Oral - Regular Piecemeal swallowing Oral - Multi-Consistency -- Oral - Pill -- Oral Phase - Comment --  CHL IP PHARYNGEAL PHASE 06/12/2021 Pharyngeal Phase Impaired Pharyngeal- Pudding Teaspoon -- Pharyngeal -- Pharyngeal- Pudding Cup -- Pharyngeal -- Pharyngeal- Honey Teaspoon -- Pharyngeal -- Pharyngeal- Honey Cup -- Pharyngeal -- Pharyngeal- Nectar Teaspoon -- Pharyngeal -- Pharyngeal- Nectar Cup Delayed swallow initiation-vallecula Pharyngeal -- Pharyngeal- Nectar Straw Delayed swallow initiation-vallecula Pharyngeal -- Pharyngeal- Thin Teaspoon -- Pharyngeal -- Pharyngeal- Thin Cup Delayed swallow initiation-pyriform sinuses Pharyngeal -- Pharyngeal- Thin Straw Delayed swallow initiation-pyriform sinuses;Penetration/Aspiration before swallow Pharyngeal Material enters airway, passes BELOW cords without attempt by patient to eject out (silent aspiration) Pharyngeal- Puree WFL Pharyngeal -- Pharyngeal- Mechanical Soft -- Pharyngeal -- Pharyngeal- Regular WFL Pharyngeal -- Pharyngeal- Multi-consistency -- Pharyngeal --  Pharyngeal- Pill -- Pharyngeal -- Pharyngeal Comment --  No flowsheet data found. [name] 06/12/2021, 2:23 PM                Medical Consultants:   None.   Subjective:    Nava Song nonverbal  Objective:    Vitals:   06/13/21 0401 06/13/21 0437 06/13/21 0823 06/13/21 0826  BP: 130/77  133/77   Pulse: 84  85   Resp: (!) 23  18   Temp:  99.5 F (37.5 C) 98.4 F (36.9 C)   TempSrc:  Oral  Oral   SpO2: 99%  100% 100%  Weight:      Height:       SpO2: 100 % O2 Flow Rate (L/min): 5 L/min FiO2 (%): 21 %   Intake/Output Summary (Last 24 hours) at 06/13/2021 0941 Last data filed at 06/12/2021 1800 Gross per 24 hour  Intake 852 ml  Output 600 ml  Net 252 ml    Filed Weights   06/07/21 0500 06/08/21 0428 06/12/21 0458  Weight: 74.1 kg 71.5 kg 71.1 kg    Exam: General exam: In no acute distress. Respiratory system: Good air movement and clear to auscultation. Cardiovascular system: S1 & S2 heard, RRR. No JVD. Gastrointestinal system: Abdomen is nondistended, soft and nontender.  Extremities: No pedal edema. Skin: No rashes, lesions or ulcers  Data Reviewed:    Labs: Basic Metabolic Panel: No results for input(s): NA, K, CL, CO2, GLUCOSE, BUN, CREATININE, CALCIUM, MG, PHOS in the last 168 hours.  GFR Estimated Creatinine Clearance: 64.2 mL/min (A) (by C-G formula based on SCr of 1.07 mg/dL (H)). Liver Function Tests: No results for input(s): AST, ALT, ALKPHOS, BILITOT, PROT, ALBUMIN in the last 168 hours.  No results for input(s): LIPASE, AMYLASE in the last 168 hours. No results for input(s): AMMONIA in the last 168 hours. Coagulation profile No results for input(s): INR, PROTIME in the last 168 hours. COVID-19 Labs  No results for input(s): DDIMER, FERRITIN, LDH, CRP in the last 72 hours.   Lab Results  Component Value Date   [place] NEGATIVE 05/15/2021    CBC: Recent Labs  Lab 06/07/21 1050  WBC 9.0  HGB 8.9*  HCT 30.2*  MCV 81.2  PLT 531*    Cardiac Enzymes: No results for input(s): CKTOTAL, CKMB, CKMBINDEX, TROPONINI in the last 168 hours. BNP (last 3 results) No results for input(s): PROBNP in the last 8760 hours. CBG: Recent Labs  Lab 06/12/21 1542 06/12/21 2100 06/13/21 0027 06/13/21 0420 06/13/21 0819  GLUCAP 121* 112* 116* 115* 114*    D-Dimer: No results for input(s): DDIMER in the last 72 hours.  Hgb A1c: No  results for input(s): HGBA1C in the last 72 hours. Lipid Profile: No results for input(s): CHOL, HDL, LDLCALC, TRIG, CHOLHDL, LDLDIRECT in the last 72 hours. Thyroid function studies: No results for input(s): TSH, T4TOTAL, T3FREE, THYROIDAB in the last 72 hours.  Invalid input(s): FREET3 Anemia work up: No results for input(s): VITAMINB12, FOLATE, FERRITIN, TIBC, IRON, RETICCTPCT in the last 72 hours. Sepsis Labs: Recent Labs  Lab 06/07/21 1050  WBC 9.0    Microbiology No results found for this or any previous visit (from the past 240 hour(s)).    Medications:    amiodarone  200 mg Per Tube Daily   amLODipine  10 mg Per Tube Daily   aspirin  81 mg Per Tube Daily   atorvastatin  40 mg Per Tube Daily   carvedilol  25 mg Per Tube BID WC   chlorhexidine gluconate (MEDLINE KIT)  15 mL Mouth Rinse BID   Chlorhexidine Gluconate Cloth  6 each Topical Daily   cloNIDine  0.1 mg Per Tube TID   feeding supplement (PROSource TF)  45 mL Per Tube BID   ferrous sulfate  300 mg Per Tube BID WC   free water  200 mL Per Tube Q6H   hydrALAZINE  100 mg Per Tube Q8H   isosorbide dinitrate  40 mg Per Tube TID   mouth rinse  15 mL Mouth Rinse 10 times per day   megestrol  80 mg Per Tube TID   multivitamin with minerals  1 tablet Per Tube Daily   pantoprazole sodium  40 mg Per Tube Daily   sacubitril-valsartan  1 tablet Per Tube BID   sodium chloride flush  10-40 mL Intracatheter Q12H   Continuous Infusions:  feeding supplement (JEVITY 1.5 CAL/FIBER) 1,000 mL (06/13/21 0850)      LOS: 29 days   Charlynne Cousins  [facility]  06/13/2021, 9:41 AM

## 2021-06-13 NOTE — Progress Notes (Signed)
Physical Therapy Treatment Patient Details Name: Holly Bradley MRN: [id number] DOB: 05/09/1976 Today's Date: 06/13/2021    History of Present Illness 45 yo female s/p mechanical thrombectomy on 5/24 of right MCA M1/M2/M3 and embolization of right MCA M3 branch due to contrast extravasation. Pt re-intubated 5/26. Repeat head CT on 5/26 showed increased cytotoxic edema and mildly increased leftward midline shift, now 5 mm, decreased SAH and IVH. Abdominal CT showing 22.9 cm mass, possibly consistent with uterine fibroids, no lymphadenopathy. s/p trach 6/3. PMH fibriods HTN HF paranoid psychosis recent hospitalizations january and february 2022    PT Comments    Pt nodding yes/no, smiling, and following one-step commands appropriately today. PT and OT saw pt together to progress seated tasks and improve pt tolerance. Pt sat EOB x15 minutes and participated well in dynamic sitting balance (AP leaning, LE movement, mirror use for postural correction), requiring min-mod truncal assist to perform. Pt requires max cues initially to attend to LUE, but once pt cued into L side pt able to perform ADL on L arm and expresses "yes" her L arm is sensitive and sore. Pt with BP 90s/50-60s throughout session, with no complaints of dizziness at any point. PT to continue to follow acutely.    Follow Up Recommendations  SNF     Equipment Recommendations  Wheelchair (measurements PT);Wheelchair cushion (measurements PT);Hospital bed;Other (comment) (hoyer lift)    Recommendations for Other Services       Precautions / Restrictions Precautions Precautions: Fall;Other (comment) Precaution Comments: L hemiparesis, PRAFO, hand splint,  trach, SBP < 160, cortrak Other Brace: L resting hand splint (4 hours on/4 hours off) Restrictions Weight Bearing Restrictions: No    Mobility  Bed Mobility Overal bed mobility: Needs Assistance Bed Mobility: Supine to Sit;Sit to Supine     Supine to sit: +2 for physical  assistance;Total assist Sit to supine: +2 for physical assistance;Total assist   General bed mobility comments: total +2 for trunk and LE management, scooting to/from EOB, and repositioning/boost once returned to supine.    Transfers                 General transfer comment: EOB only  Ambulation/Gait                 Stairs             Wheelchair Mobility    Modified Rankin (Stroke Patients Only) Modified Rankin (Stroke Patients Only) Pre-Morbid Rankin Score: No symptoms Modified Rankin: Severe disability     Balance Overall balance assessment: Needs assistance Sitting-balance support: Single extremity supported;Feet supported Sitting balance-Leahy Scale: Poor Sitting balance - Comments: min-mod posterior assist, preference for L lateral leaning. Max cues for lifting head upright Postural control: Posterior lean;Left lateral lean Standing balance support: Bilateral upper extremity supported;During functional activity                                Cognition Arousal/Alertness: Awake/alert (periods of eye closing, interactive for a majority of session) Behavior During Therapy: Flat affect Overall Cognitive Status: Difficult to assess Area of Impairment: Following commands;Attention;Safety/judgement;Problem solving                   Current Attention Level: Sustained   Following Commands: Follows one step commands with increased time Safety/Judgement: Decreased awareness of deficits;Decreased awareness of safety   Problem Solving: Slow processing;Requires verbal cues;Requires tactile cues;Difficulty sequencing General Comments: Pt nods yes/no during session,  follows all one-step commands but occasionally needs tactile cuing especially when referring to L side environment. Attends to ADL tasks x1 min at a time      Exercises General Exercises - Upper Extremity Wrist Flexion: PROM;Left;10 reps;Seated Wrist Extension:  PROM;Left;Seated;10 reps Digit Composite Flexion: PROM;Left;10 reps;Seated Composite Extension: PROM;Left;10 reps;Seated General Exercises - Lower Extremity Short Arc Quad: PROM;Left;10 reps;Supine Long Arc Quad: AAROM;Right;15 reps;Seated Other Exercises Other Exercises: Seated exercise, static and dynamic seated balance x15 minutes: AP truncal leaning, x5 reps each direction with assist of PT hand in pt's R hand; mirror use for pt correcting posture to upright; LE exercise to challenge seated balance and improve core control; head lifts to upright with shoulder retraction; ADL tasks    General Comments        Pertinent Vitals/Pain Pain Assessment: Faces Faces Pain Scale: Hurts little more Pain Location: generalized discomfort with coughing Pain Descriptors / Indicators: Grimacing;Discomfort Pain Intervention(s): Limited activity within patient's tolerance;Monitored during session;Repositioned    Home Living                      Prior Function            PT Goals (current goals can now be found in the care plan section) Acute Rehab PT Goals PT Goal Formulation: With patient Time For Goal Achievement: 06/18/21 Potential to Achieve Goals: Fair Progress towards PT goals: Progressing toward goals    Frequency    Min 3X/week      PT Plan Current plan remains appropriate    Co-evaluation PT/OT/SLP Co-Evaluation/Treatment: Yes Reason for Co-Treatment: For patient/therapist safety;To address functional/ADL transfers;Complexity of the patient's impairments (multi-system involvement) PT goals addressed during session: Mobility/safety with mobility;Balance;Strengthening/ROM        AM-PAC PT "6 Clicks" Mobility   Outcome Measure  Help needed turning from your back to your side while in a flat bed without using bedrails?: Total Help needed moving from lying on your back to sitting on the side of a flat bed without using bedrails?: Total Help needed moving to and  from a bed to a chair (including a wheelchair)?: Total Help needed standing up from a chair using your arms (e.g., wheelchair or bedside chair)?: Total Help needed to walk in hospital room?: Total Help needed climbing 3-5 steps with a railing? : Total 6 Click Score: 6    End of Session   Activity Tolerance: Patient tolerated treatment well;Patient limited by fatigue Patient left: in bed;with call bell/phone within reach;with bed alarm set Nurse Communication: Mobility status PT Visit Diagnosis: Hemiplegia and hemiparesis;Other abnormalities of gait and mobility (R26.89);Unsteadiness on feet (R26.81);Muscle weakness (generalized) (M62.81);Difficulty in walking, not elsewhere classified (R26.2);Other symptoms and signs involving the nervous system (R29.898);Apraxia (R48.2) Hemiplegia - Right/Left: Left Hemiplegia - dominant/non-dominant: Non-dominant Hemiplegia - caused by: Cerebral infarction;Nontraumatic SAH;Nontraumatic intracerebral hemorrhage     Time: 1001-1033 PT Time Calculation (min) (ACUTE ONLY): 32 min  Charges:  $Neuromuscular Re-education: 8-22 mins                     Stacie Glaze, PT DPT Acute Rehabilitation Services Pager [phone]  Office [phone]    [place] 06/13/2021, 11:42 AM

## 2021-06-13 NOTE — Progress Notes (Signed)
CSW received request from Palliative to assist patient's brother in contacting [employer] regarding patient's Medicaid. CSW emailed Santa Lighter and requested her to follow up.  Cedric Fishman, LCSW

## 2021-06-13 NOTE — Progress Notes (Signed)
Occupational Therapy Treatment Patient Details Name: Holly Bradley MRN: [id number] DOB: Sep 26, 1976 Today's Date: 06/13/2021    History of present illness 45 yo female s/p mechanical thrombectomy on 5/24 of right MCA M1/M2/M3 and embolization of right MCA M3 branch due to contrast extravasation. Pt re-intubated 5/26. Repeat head CT on 5/26 showed increased cytotoxic edema and mildly increased leftward midline shift, now 5 mm, decreased SAH and IVH. Abdominal CT showing 22.9 cm mass, possibly consistent with uterine fibroids, no lymphadenopathy. s/p trach 6/3. PMH fibriods HTN HF paranoid psychosis recent hospitalizations january and february 2022   OT comments  Pt seen in conjunction with PT to maximize pts activity tolerance. Pt continues to present with L hemiparesis, impaired balance and generalized deconditioning. Pt currently requires total A +2 for all bed mobility needing MIN- MOD A for sitting balance. Pt able to attend to L hand more this session ( 2 mins) to apply lotion to L hand, utilized mirror to provided visual feedback. Pt smiling and nodding appropriately to questions. Pt would continue to benefit from skilled occupational therapy while admitted and after d/c to address the below listed limitations in order to improve overall functional mobility and facilitate independence with BADL participation. DC plan remains appropriate, will follow acutely per POC.   pt on RA during session with SpO2 WFL  supine BP 99/58, sitting EOB 91/66. sitting EOB ~ 5 mins after therex 95/63, supine 113/65   Follow Up Recommendations  SNF    Equipment Recommendations  Wheelchair (measurements OT);Wheelchair cushion (measurements OT);Hospital bed;3 in 1 bedside commode    Recommendations for Other Services      Precautions / Restrictions Precautions Precautions: Fall;Other (comment) Precaution Comments: L hemiparesis, PRAFO,  resting hand splint,  trach, SBP < 160, cortrak Other Brace: L  resting hand splint (4 hours on/4 hours off) Restrictions Weight Bearing Restrictions: No       Mobility Bed Mobility Overal bed mobility: Needs Assistance Bed Mobility: Supine to Sit;Sit to Supine     Supine to sit: +2 for physical assistance;Total assist Sit to supine: +2 for physical assistance;Total assist   General bed mobility comments: total +2 for trunk and LE management, scooting to/from EOB, and repositioning/boost once returned to supine.    Transfers                 General transfer comment: EOB only    Balance Overall balance assessment: Needs assistance Sitting-balance support: Single extremity supported;Feet supported Sitting balance-Leahy Scale: Poor Sitting balance - Comments: min-mod posterior assist, preference for L lateral leaning. Max cues for lifting head upright Postural control: Posterior lean;Left lateral lean Standing balance support: Bilateral upper extremity supported;During functional activity                               ADL either performed or assessed with clinical judgement   ADL           Upper Body Bathing: Cueing for safety;Cueing for sequencing;Sitting;Minimal assistance Upper Body Bathing Details (indicate cue type and reason): simulated via applying lotion to pts L hand with R hand, cues to initiate task and to attend to L side, MIN A for sitting balance                         Functional mobility during ADLs: Total assistance;+2 for physical assistance (bed mobility only) General ADL Comments: session focus on sitting balance EOB  Vision       Perception     Praxis      Cognition Arousal/Alertness: Awake/alert;Lethargic (moments of lethargy during session) Behavior During Therapy: Flat affect Overall Cognitive Status: Difficult to assess Area of Impairment: Following commands;Attention;Safety/judgement;Problem solving                   Current Attention Level: Sustained    Following Commands: Follows one step commands with increased time Safety/Judgement: Decreased awareness of deficits;Decreased awareness of safety   Problem Solving: Slow processing;Requires verbal cues;Requires tactile cues;Difficulty sequencing General Comments: pt nodding yes and no appropriately durign session, no efforts to verbalize noted, able to smile occassionally        Exercises General Exercises - Upper Extremity Wrist Flexion: PROM;Left;10 reps;Seated Wrist Extension: PROM;Left;Seated;10 reps Digit Composite Flexion: PROM;Left;10 reps;Seated Composite Extension: PROM;Left;10 reps;Seated  Other Exercises Other Exercises: Seated exercise, static and dynamic seated balance x15 minutes: AP truncal leaning, x5 reps each direction with assist of PT hand in pt's R hand; mirror use for pt correcting posture to upright; LE exercise to challenge seated balance and improve core control; head lifts to upright with shoulder retraction; ADL task of applying lotion to pts L hand and forearm   Shoulder Instructions       General Comments doffed resting hand splint during session with swelling noted; doffed splint and alerted Rn left LUE in neutral with L hand elevated, pt on RA during session with SpO2 WFL. supine BP 99/58, sitting EOB 91/66. sitting EOB ~ 5 mins after therex 95/63, supine 113/65    Pertinent Vitals/ Pain       Pain Assessment: Faces Faces Pain Scale: Hurts little more Pain Location: generalized discomfort with coughing Pain Descriptors / Indicators: Grimacing;Discomfort Pain Intervention(s): Limited activity within patient's tolerance;Monitored during session;Repositioned  Home Living                                          Prior Functioning/Environment              Frequency  Min 2X/week        Progress Toward Goals  OT Goals(current goals can now be found in the care plan section)  Progress towards OT goals: Progressing toward  goals  Acute Rehab OT Goals OT Goal Formulation: Patient unable to participate in goal setting Time For Goal Achievement: 06/25/21 Potential to Achieve Goals: [place] Discharge plan remains appropriate;Frequency remains appropriate    Co-evaluation      Reason for Co-Treatment: For patient/therapist safety;To address functional/ADL transfers;Complexity of the patient's impairments (multi-system involvement) PT goals addressed during session: Mobility/safety with mobility;Balance;Strengthening/ROM OT goals addressed during session: ADL's and self-care      AM-PAC OT "6 Clicks" Daily Activity     Outcome Measure   Help from another person eating meals?: A Lot Help from another person taking care of personal grooming?: A Lot Help from another person toileting, which includes using toliet, bedpan, or urinal?: Total Help from another person bathing (including washing, rinsing, drying)?: Total Help from another person to put on and taking off regular upper body clothing?: Total Help from another person to put on and taking off regular lower body clothing?: Total 6 Click Score: 8    End of Session Equipment Utilized During Treatment: Other (comment) (RA during session, mirror for visual feedback)  OT Visit Diagnosis: Unsteadiness on feet (R26.81);Muscle  weakness (generalized) (M62.81);Hemiplegia and hemiparesis Hemiplegia - Right/Left: Left Hemiplegia - dominant/non-dominant: Non-Dominant Hemiplegia - caused by: Cerebral infarction   Activity Tolerance Patient tolerated treatment well   Patient Left in bed;with call bell/phone within reach;with bed alarm set;with nursing/sitter in room   Nurse Communication Mobility status        Time: 1001-1035 OT Time Calculation (min): 34 min  Charges: OT General Charges $OT Visit: 1 Visit OT Treatments $Therapeutic Activity: 8-22 mins  [name], COTA/L Acute Rehabilitation Services [phone]   [name] 06/13/2021, 12:18 PM

## 2021-06-13 NOTE — Progress Notes (Signed)
  Speech Language Pathology Treatment: Dysphagia;Cognitive-Linquistic  Patient Details Name: Holly Bradley MRN: [id number] DOB: 1976/02/25 Today's Date: 06/13/2021 Time: 1310-1420 SLP Time Calculation (min) (ACUTE ONLY): 70 min  Assessment / Plan / Recommendation Clinical Impression  Swallowing: Assisted Holly Bradley with lunch.  Tray was set up and she was repositioned upright. PMV in place. She fed herself with RUE with only occasional physical assist needed to load utensil. She demonstrated difficulty masticating some of the dysphagia 2 items on her tray, with difficulty maintaining oral seal and occasional coughing noted.  She had an easier time with dysphagia 1 (pureed) consistency with improved oral control and fewer episodes of coughing.  Diet downgraded to dysphagia 1; continue nectar liquids.  Asked Dr. Venetia Constable and Larkin Ina, RD, to decrease her TF so she might have a better appetite.   Communication: Holly Bradley continues to be unable to voice functionally or formulate single words/syllables despite max multimodal cues and with PMV in place.  When offered paper and pen, she was able to quickly and accurately write full sentences,  answer questions about her history/needs, and ask questions (e.g, "Will I able to eat like normal?") She wrote that she worked in [employer] for [employer] for a year, that she was from [place], and that she wanted a burger and a sandwich.    Please make paper/pen accessible during visits so that Holly Bradley can be more involved in her care.  D/W PT and RN and messaged Dr. Venetia Constable.  SLP will continue to follow - pt is making excellent progress with swallowing and communication.    HPI HPI: Pt is a [name] who presented with AMS and slurred speech after being found down. MRI showing moderately large acute right MCA infarct with associated petechial hemorrhage, SAH, and minimal IVH. S/p mechanical thrombectomy of M1/M2/M3 and embolization of right MCA M3 branch. Significant  PMH: uncontrolled HTN, tobacco use disorder, HF, paroxysmal SVT, paranoid psychosis. Required intubation s/p trach 6/3, changed to cuffless 6/16. Pt pending PEG however large uterine mass disrupting planned placement, and goals of care are unclear at this time      SLP Plan  Continue with current plan of care       Recommendations  Diet recommendations: Dysphagia 1 (puree);Nectar-thick liquid Liquids provided via: Straw;Cup Medication Administration: Crushed with puree Supervision: Staff to assist with self feeding;Full supervision/cueing for compensatory strategies Compensations: Minimize environmental distractions      Patient may use Passy-Muir Speech Valve: Intermittently with supervision;During all therapies with supervision PMSV Supervision: Full         Oral Care Recommendations: Oral care BID Follow up Recommendations: Skilled Nursing facility Plan: Continue with current plan of care       [name]. [name], [place] Office number [phone] Pager [phone]  [name] 06/13/2021, 2:29 PM

## 2021-06-14 LAB — GLUCOSE, CAPILLARY
Glucose-Capillary: 110 mg/dL — ABNORMAL HIGH (ref 70–99)
Glucose-Capillary: 94 mg/dL (ref 70–99)
Glucose-Capillary: 98 mg/dL (ref 70–99)
Glucose-Capillary: 105 mg/dL — ABNORMAL HIGH (ref 70–99)

## 2021-06-14 NOTE — Progress Notes (Signed)
Calorie Count Note  48 hour calorie count ordered.  Diet: dysphagia 1 with nectar thick liquids Supplements: magic cup TID  Day 1 Results (06/13/21): Breakfast: 217 kcals, 12.5g protein Lunch: no meal documentation provided Dinner: 195 kcals, 5g protein Supplements: 145 kcals, 4.5g protein  Day 1 Total Intake: 557 kcal (30% of minimum estimated needs)  22g protein (24% of minimum estimated needs)  Estimated Nutritional Needs:  Kcal:  1850-2100 Protein:  90-105 grams Fluid:  >1.8 L/day  Nutrition Dx: Inadequate oral intake related to inability to eat as evidenced by NPO status. - ongoing  Goal: Patient will meet greater than or equal to 90% of their needs - addressing with supplements and tube feeding  Intervention:  -Continue 48 hour calorie count -Continue Magic cup TID with meals, each supplement provides 290 kcal and 9 grams of protein   Continue nocturnal TF via Cortrak: Jevity 1.5 @ 75 ml/hr administered over 12 hours from 1800-0600 ProSource TF daily 200 ml of free water QID   Nocturnal TF regimen provides 1390 kcals, 68g protein, 659ml free water (1443ml total free water with flushes) Meets ~75% minimum estimated calorie and protein needs    Larkin Ina, MS, RD, LDN (she/her/hers) RD pager number and weekend/on-call pager number located in [place].

## 2021-06-14 NOTE — Progress Notes (Signed)
NAME:  Holly Bradley, MRN:  [id number], DOB:  11-28-1976, LOS: 65  ADMISSION DATE:  05/15/2021, CONSULTATION DATE:  06/14/21 REFERRING MD:  Leonie Man, CHIEF COMPLAINT:  Respiratory failure   History of Present Illness:  Holly Bradley is a [name] with PMH of HTN, tobacco use, HFpF, DMT2 (controlled with diet and exercise), pSVT, uterine fibroids, paranoid psychosis who presented with AMS on 5/24.   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  On follow up imaging, she had stable appearing SAH and continued R basal ganglia infarct with 61mm R to L shift.   She was admitted to the Neuro ICU and treating with hypertonic saline, statin and BP control.  On 5/26, PCCM consulted when pt became acutely less responsive with respiratory distress along with abdominal distension worse than baseline, per patient.  She was intubated and repeat head CT, abd/pelvis CT ordered.  Repeat head CT on 5/26 showed increased cytotoxic edema and mildly increased leftward midline shift, now 5 mm, decreased SAH and IVH.  Abdominal CT showing 22.9 cm mass, possibly consistent with uterine fibroids, no lymphadenopathy.      Significant Hospital Events: Including procedures, antibiotic start and stop dates in addition to other pertinent events   5/24 Admit to neurology after ED visit with AMS, found to have R MCA stroke, underwent mechanical thrombectomy, M3 coil embolization, subsequent SAH 5/26 decreased mental status and respiratory distress, intubated  6/1 still not candidate for extubation with poor mentation and cough mechanics. Family unsure if pt would want trach. Staph epi bacteremia, continues on ancef  6/2 moving RUE RLE following some commands. decision to move forward with tracheostomy.   6/3 perc trach. Slight WBC increase but no  fever, continues on ancef  6/4 Tolerating PSV/CPAP  6/5 WBC up to 17, last day of ancef for staph pna. Trying ATC  6/9 ID consulted for staph epi bacteremia . Felt contamination and stopped abx.  6/12 had fever spikes but nothing to explain  6/14 Tmax 100.1, no leukocytosis, no fevers since. 6/13 changed to cuffless 6.0 shiley 6/22 downgraded to dysphagia 1 diet, PEG on hold to see if she can do more oral intake.   Interim History / Subjective:  No overnight issues. Downgraded to dysphagia 1 diet. PEG on hold due to fibroids. Tube feeds reduced to see if she can increase her oral intake.   Objective   Blood pressure (!) 144/77, pulse 73, temperature 98.1 F (36.7 C), temperature source Oral, resp. rate 10, height 5\' 5"  (1.651 m), weight 71.1 kg, SpO2 98 %.    FiO2 (%):  [21 %] 21 %   Intake/Output Summary (Last 24 hours) at 06/14/2021 1000 Last data filed at 06/14/2021 0600 Gross per 24 hour  Intake 75 ml  Output 1150 ml  Net -1075 ml   Filed Weights   06/07/21 0500 06/08/21 0428 06/12/21 0458  Weight: 74.1 kg 71.5 kg 71.1 kg   General: chronically ill appearing woman lying in bed in NAD HEENT: mmm. Sclera anicteric Neck: trach in place without erythema or drainage around trach. Shiley # 6 cuffed, on trach collar.  Pulm: breathing comfortably on TC, no wheezing or rhonchi Card S1S2, RRR Abd : fibroids hard in lower abdomen, soft Extremities: moving R side, left side in contracture braces Neuro: right sided gaze preference. Follows commands, left sided hemiparesis  Labs/imaging that I havepersonally reviewed  (right click and "Reselect all SmartList Selections" daily)  Flatulence/abdominal cramping Loose stool Staph epidermis bacteremia (not clear if contaminant; treated) Sepsis due to staph a. 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 Calorie counts being performed. She endorses being hungry and wanting to eat more. - she is getting chest PT but can't tell if it is helping her cough.  - would maintain the 6.0 cuffless shiley trach for now. I do not think she should be decannulated or further downsized at this time.  She would need to show dramatic improvements in functional status including sitting up in chair for most of the day, increased mobility.  I would also like to see her wearing the PMV more consistently and have a stronger voice.  - If the goal is to improve her oral intake, could consider removing NG tube at some point to help with swallowing comfort.  I still think a PEG would be helpful in allowing her to eat for comfort and still receive supplemental nutrition. Especially if the goals are to continue with aggressive care.   Rest of care per primary. PCCM will continue to follow 2 days per week. Please call in the interim with questions.  Lenice Llamas, MD Pulmonary and [place]

## 2021-06-14 NOTE — Progress Notes (Signed)
[facility] PROGRESS NOTE  Zela Sobieski WUJ:[id number] DOB: March 24, 1976 DOA: 05/15/2021 PCP: Mitzi Hansen, MD  Status: Remains inpatient appropriate because:Altered mental status, Unsafe d/c plan, and Inpatient level of care appropriate due to severity of illness  Dispo: The patient is from: Home              Anticipated d/c is to: SNF              Patient currently is not medically stable to d/c.   Difficult to place patient Yes   Level of care: Med-Surg  Code Status: Full code except for no CPR Family Communication: Patient only DVT prophylaxis: SCDs with history of hemorrhagic stroke COVID vaccination status: Unknown    HPI: 45 y.o. female past medical history of essential hypertension, diabetes mellitus type 2 large uterine fibroids paroxysmal SVT, chronic diastolic heart failure paranoid psychosis comes into the ED on 05/16/2019 with confusion was found to have a right MCA occlusion underwent thrombectomy which was complicated by contrast extravasation and M3 coiling embolization and revascularization.  She subsequently developed subarachnoid hemorrhage repeat imaging revealed right basal ganglia infarct with a 3 mm left shift, she was treated with hypertonic saline in the ICU, on 05/17/2021 she developed respiratory distress and he was intubated CT of the head showed increased cytotoxic edema and left side shift.  No mental status improvement is now trach and with nasal collar track for tube feedings.  She has been treated with Ancef and completed a 7-day course for MSSA pneumonia, her blood cultures on 05/21/2021 were positive for staph epi.  Infectious disease was consulted and are on board she has remained off antibiotics since 2022 repeated blood cultures on 06/02/2021 have remained negative.  5/24 Admit to neurology after ED visit with AMS, found to have R MCA stroke, underwent mechanical thrombectomy, M3 coil embolization, subsequent SAH 5/26 decreased mental status and  respiratory distress, intubated 6/1 still not candidate for extubation with poor mentation and cough mechanics. Family unsure if pt would want trach. Staph epi bacteremia, continues on ancef 6/2 moving RUE RLE following some commands. decision to move forward with tracheostomy.   6/3 perc trach. Slight WBC increase but no fever, continues on ancef 6/4 Tolerating PSV/CPAP 6/5 WBC up to 17, last day of ancef for staph pna. Trying ATC  6/9 ID consulted for staph epi bacteremia . Felt contamination and stopped abx. 6/12 had fever spikes but nothing to explain  6/13 change to cuffless trach   Subjective: Alert.  Trach not capped or with PMV valve in place.  Patient unable to verbally respond.  Patient was given simple yes or no type answers and was to correlate with raising 1 or 2 fingers which she did appropriately.  Smiled during conversation.  Objective: Vitals:   06/14/21 0329 06/14/21 0610  BP:    Pulse:  78  Resp: 15 18  Temp: 99 F (37.2 C)   SpO2: 99%     Intake/Output Summary (Last 24 hours) at 06/14/2021 0739 Last data filed at 06/14/2021 0600 Gross per 24 hour  Intake 105 ml  Output 1150 ml  Net -1045 ml   Filed Weights   06/07/21 0500 06/08/21 0428 06/12/21 0458  Weight: 74.1 kg 71.5 kg 71.1 kg    Exam:  Constitutional: NAD, calm, comfortable Respiratory: 6.0 cuffless trach to room air with humidity via trach collar, clear to auscultation bilaterally, no wheezing, no crackles. Normal respiratory effort. No accessory muscle use.  Sounding cough with secretions noted  expectorated from trach Cardiovascular: Regular rate and rhythm, no murmurs / rubs / gallops. No extremity edema.  Extremities warm to touch with adequate capillary refill.  Normotensive. Abdomen: no tenderness, large firm abdominal mass secondary to known massive uterine fibroid.  Bowel sounds positive.  Has panda tube in place clamped to allow for oral intake. LBM/22 Neurologic: CN 2-12 grossly intact  except for left facial droop when smiling.  Sensation intact on right side but diminished on the left side with patient reporting sharp sensation as dull.  DTR normal.  Strength 3+-4/5 on the right with purposeful movement.  Dense hemiplegia involving left side. Psychiatric: Patient is alert and appears to understand questions asked.  Smiling appropriately noted above able to communicate with simple 1-2 word responses when correlated with a number for her to raise her fingers.   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  Initial hypercoagulable panel suggestive of lupus anticoagulant but recommendation is to repeat hypercoagulable panel in 12 weeks. Continue aspirin and Lipitor. Currently patient alert and following commands with nonaffected side.  Smiles appropriately and is able to differentiate between sharp and dull, when given choice between 2 answers and the option of raising 1-2 fingers patient is able to do this appropriately Continue PT/OT and SLP Given significant hemiplegia at best will be wheelchair-bound  Cytotoxic cerebral edema/hypernatremia Initially treated with 3% saline subsequently switched to free water Patient with initial hyponatremia with improvement in sodium with neurological team recommending slow correction of sodium CT head stable Sodium normalized with most recent reading 137 on 6/15 Continue free water per tube  Hypertension/grade 2 diastolic dysfunction Echocardiogram this admission reveals preserved LVEF with grade 2 diastolic dysfunction Continue carvedilol, hydralazine, Isordil, Entresto, Catapres and Norvasc Currently euvolemic  Dysphagia due to recent stroke SLP reevaluated the patient with diet recommendations as per current order PEG tube placement will be difficult given significant size  of uterine fibroid Patient reports that she can feel trach and this is causing her to have some difficulty swallowing Continue nocturnal tube feedings until oral intake improves   Acute respiratory failure with hypoxia status post tracheostomy/MSSA pneumonia Currently being followed by PCCM and approved skilled assistance. Trach team following Currently has 6.0 cuffless trach and likely will downsize-has strong productive cough Continue PMV and if continues to do well may be able to begin capping trials Completed 7 days of cefazolin  Fever of unknown source/resolved: Blood cultures on 5/30 with staph epidermis to have hominis-both likely contaminants Repeat blood cultures on 6/6 with staph epidermis Repeat blood cultures on 6/11 negative No fever for the past 9 days  Thrombocytosis: Acting as an acute phase reactant with platelets trending downward 531,000 as of 6/16 Check labs.   Acute blood loss anemia secondary to large uterine fibroids: Extremely large palpable on exam OB/GYN was consulted they recommended to continue Megace. OB/GYN reevaluated.  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   Hemoglobin as above  Protein calorie malnutrition Body mass index is 26.08 kg/m.  Continue dysphagia 1 diet with nectar thick liquids Continue nocturnal tube feedings with Jevity and Prosource per tube  History of paroxysmal SVT: Continue home  amiodarone dose.   Preop evaluation: Cardiology was consulted and deemed her intermediate risk for cardiopulmonary complications, they recommended no further cardiac work-up at this time. Recommended to monitor postop and intraoperative fluid volume. Monitor on telemetry for signs of arrhythmia.   Goals of care: She has  a very poor prognosis the brother has unrealistic expectations about outcomes. Will ask palliative care to continue to work with them as the patient quality of life will be poor as she will be bedbound      Data Reviewed: Basic Metabolic Panel: No results for input(s): NA, K, CL, CO2, GLUCOSE, BUN, CREATININE, CALCIUM, MG, PHOS in the last 168 hours. Liver Function Tests: No results for input(s): AST, ALT, ALKPHOS, BILITOT, PROT, ALBUMIN in the last 168 hours. No results for input(s): LIPASE, AMYLASE in the last 168 hours. No results for input(s): AMMONIA in the last 168 hours. CBC: Recent Labs  Lab 06/07/21 1050  WBC 9.0  HGB 8.9*  HCT 30.2*  MCV 81.2  PLT 531*   Cardiac Enzymes: No results for input(s): CKTOTAL, CKMB, CKMBINDEX, TROPONINI in the last 168 hours. BNP (last 3 results) No results for input(s): BNP in the last 8760 hours.  ProBNP (last 3 results) No results for input(s): PROBNP in the last 8760 hours.  CBG: Recent Labs  Lab 06/13/21 1613 06/13/21 2011 06/13/21 2047 06/13/21 2331 06/14/21 0422  GLUCAP 116* 117* 120* 116* 110*    No results found for this or any previous visit (from the past 240 hour(s)).   Studies: DG Swallowing Func-Speech Pathology  Result Date: 06/12/2021 Formatting of this result is different from the original. Objective Swallowing Evaluation: Type of Study: MBS-Modified Barium Swallow Study  Patient Details Name: Mavi Un MRN: [id number] Date of Birth: 1976/05/22 Today's Date: 06/12/2021 Time: SLP Start Time (ACUTE ONLY): 70 -SLP Stop Time (ACUTE ONLY): 1310 SLP Time Calculation (min) (ACUTE ONLY): 40 min Past Medical History: Past Medical History: Diagnosis Date  Hypertension  Past Surgical History: Past Surgical History: Procedure Laterality Date  IR ANGIOGRAM FOLLOW UP STUDY  05/15/2021  IR CT HEAD LTD  05/15/2021  IR PERCUTANEOUS ART THROMBECTOMY/INFUSION INTRACRANIAL INC DIAG ANGIO  05/15/2021     IR PERCUTANEOUS ART  THROMBECTOMY/INFUSION INTRACRANIAL INC DIAG ANGIO  05/15/2021  IR TRANSCATH/EMBOLIZ  05/15/2021  RADIOLOGY WITH ANESTHESIA N/A 05/15/2021  Procedure: IR WITH ANESTHESIA;  Surgeon: Radiologist, Medication, MD;  Location: [facility];  Service: Radiology;  Laterality: N/A; HPI: Pt is a [name] who presented with AMS and slurred speech after being found down. MRI showing moderately large acute right MCA infarct with associated petechial hemorrhage, SAH, and minimal IVH. S/p mechanical thrombectomy of M1/M2/M3 and embolization of right MCA M3 branch. Significant PMH: uncontrolled HTN, tobacco use disorder, HF, paroxysmal SVT, paranoid psychosis. Required intubation s/p trach 6/3, changed to cuffless 6/16. Pt pending PEG however large uterine mass disrupting planned placement, and goals of care are unclear at this time  Subjective: Alert and participatory Assessment / Plan / Recommendation CHL IP CLINICAL IMPRESSIONS 06/12/2021 Clinical Impression Pt presented with a much more successful swallow than anticipated -  her dysphagia is relatively mild.  PMV was in place for the study; she followed all commands; was unable to communicate verbally but used gestures/facial expressions. Pt demonstrated mild deficits with oral control, lip seal on left, and premature loss of thin liquids over base of tongue. Thin liquids reached the pyriform sinuses and occasionally a trace amount spilled into the larynx PRIOR TO swallow onset -  there was no immediate cough response, but after 30 seconds a cough occurred. Pt protected her airway reliably with nectar thick liquids, purees, and a cracker - there was consistent laryngeal vestibule closure (no penetration/aspiration).  Also notable was strong pharyngeal squeeze with no residue remaining in pharynx after the swallows.  Recommend beginning an oral diet and allowing Ms. Alper an opportunity to [place].  REC: dysphagia 2; nectar thick liquids; meds crushed in puree.  Pt will need to be  sitting as upright as possible; fully supervise all PO intake and assist her with self-feeding as able.  Reduce TF per dietician.  Use PMV with PO intake.  SLP will follow for safety/diet progression. SLP Visit Diagnosis Dysphagia, oropharyngeal phase (R13.12) Attention and concentration deficit following -- Frontal lobe and executive function deficit following -- Impact on safety and function Mild aspiration risk   CHL IP TREATMENT RECOMMENDATION 06/12/2021 Treatment Recommendations Therapy as outlined in treatment plan below   Prognosis 06/12/2021 Prognosis for Safe Diet Advancement Good Barriers to Reach Goals -- Barriers/Prognosis Comment -- CHL IP DIET RECOMMENDATION 06/12/2021 SLP Diet Recommendations Dysphagia 2 (Fine chop) solids;Nectar thick liquid Liquid Administration via Cup;Straw Medication Administration Crushed with puree Compensations Minimize environmental distractions Postural Changes Seated upright at 90 degrees   CHL IP OTHER RECOMMENDATIONS 06/12/2021 Recommended Consults -- Oral Care Recommendations Oral care BID Other Recommendations Order thickener from pharmacy   CHL IP FOLLOW UP RECOMMENDATIONS 06/08/2021 Follow up Recommendations Inpatient Rehab;Skilled Nursing facility   [facility] IP FREQUENCY AND DURATION 06/12/2021 Speech Therapy Frequency (ACUTE ONLY) min 3x week Treatment Duration 2 weeks      CHL IP ORAL PHASE 06/12/2021 Oral Phase Impaired Oral - Pudding Teaspoon -- Oral - Pudding Cup -- Oral - Honey Teaspoon -- Oral - Honey Cup -- Oral - Nectar Teaspoon -- Oral - Nectar Cup -- Oral - Nectar Straw -- Oral - Thin Teaspoon -- Oral - Thin Cup -- Oral - Thin Straw -- Oral - Puree -- Oral - Mech Soft -- Oral - Regular Piecemeal swallowing Oral - Multi-Consistency -- Oral - Pill -- Oral Phase - Comment --  CHL IP PHARYNGEAL PHASE 06/12/2021 Pharyngeal Phase Impaired Pharyngeal- Pudding Teaspoon -- Pharyngeal -- Pharyngeal- Pudding Cup -- Pharyngeal -- Pharyngeal- Honey Teaspoon -- Pharyngeal --  Pharyngeal- Honey Cup -- Pharyngeal -- Pharyngeal- Nectar Teaspoon -- Pharyngeal -- Pharyngeal- Nectar Cup Delayed swallow initiation-vallecula Pharyngeal -- Pharyngeal- Nectar Straw Delayed swallow initiation-vallecula Pharyngeal -- Pharyngeal- Thin Teaspoon -- Pharyngeal -- Pharyngeal- Thin Cup Delayed swallow initiation-pyriform sinuses Pharyngeal -- Pharyngeal- Thin Straw Delayed swallow initiation-pyriform sinuses;Penetration/Aspiration before swallow Pharyngeal Material enters airway, passes BELOW cords without attempt by patient to eject out (silent aspiration) Pharyngeal- Puree WFL Pharyngeal -- Pharyngeal- Mechanical Soft -- Pharyngeal -- Pharyngeal- Regular WFL Pharyngeal -- Pharyngeal- Multi-consistency -- Pharyngeal -- Pharyngeal- Pill -- Pharyngeal -- Pharyngeal Comment --  No flowsheet data found. [name] 06/12/2021, 2:23 PM               Scheduled Meds:  amiodarone  200 mg Per Tube Daily   amLODipine  10 mg Per Tube Daily   aspirin  81 mg Per Tube Daily   atorvastatin  40 mg Per Tube Daily   carvedilol  25 mg Per Tube BID WC   chlorhexidine gluconate (MEDLINE KIT)  15 mL Mouth Rinse BID   Chlorhexidine Gluconate Cloth  6 each Topical Daily   cloNIDine  0.1 mg Per Tube TID   feeding supplement (JEVITY 1.5 CAL/FIBER)  900  mL Per Tube Q24H   feeding supplement (PROSource TF)  45 mL Per Tube Daily   ferrous sulfate  300 mg Per Tube BID WC   free water  200 mL Per Tube Q6H   hydrALAZINE  100 mg Per Tube Q8H   isosorbide dinitrate  40 mg Per Tube TID   mouth rinse  15 mL Mouth Rinse 10 times per day   megestrol  80 mg Per Tube TID   multivitamin with minerals  1 tablet Per Tube Daily   pantoprazole sodium  40 mg Per Tube Daily   sacubitril-valsartan  1 tablet Per Tube BID   sodium chloride flush  10-40 mL Intracatheter Q12H   Continuous Infusions:  Principal Problem:   Cerebrovascular accident (CVA) ([place]) Active Problems:   Acute respiratory failure with hypoxia  (HCC)   Fibroid   Iron deficiency anemia due to chronic blood loss   Cerebral edema (HCC)   Chronic HFrEF (heart failure with reduced ejection fraction) ([place])   Pneumonia due to methicillin susceptible Staphylococcus aureus (HCC)   Abdominal distention   Encounter for feeding tube placement   Encounter for intubation   Ileus ([place])   Staphylococcus epidermidis bacteremia   Status post tracheostomy ([place])   Abnormal uterine bleeding (AUB)   Consultants: PCCM Palliative medicine OB-GYN CIR General surgery Infectious disease  Procedures: Echocardiogram IR transcatheter embolization on 5/24 EEG IR placement of tracheostomy on 6/3  Antibiotics: Cefazolin 5/30 through 6/5   Time spent: 45 minutes    Erin Hearing ANP  [facility] 7 am - 330 pm/M-F for direct patient care and secure chat Please refer to Amion for contact info 30  days

## 2021-06-15 LAB — CBC WITH DIFFERENTIAL/PLATELET
Abs Immature Granulocytes: 0 10*3/uL (ref 0.00–0.07)
Basophils Absolute: 0 10*3/uL (ref 0.0–0.1)
Basophils Relative: 1 %
Eosinophils Absolute: 0 10*3/uL (ref 0.0–0.5)
Eosinophils Relative: 0 %
HCT: 29.8 % — ABNORMAL LOW (ref 36.0–46.0)
Hemoglobin: 9 g/dL — ABNORMAL LOW (ref 12.0–15.0)
Lymphocytes Relative: 11 %
Lymphs Abs: 0.5 10*3/uL — ABNORMAL LOW (ref 0.7–4.0)
MCH: 25 pg — ABNORMAL LOW (ref 26.0–34.0)
MCHC: 30.2 g/dL (ref 30.0–36.0)
MCV: 82.8 fL (ref 80.0–100.0)
Monocytes Absolute: 0.5 10*3/uL (ref 0.1–1.0)
Monocytes Relative: 11 %
Neutro Abs: 3.5 10*3/uL (ref 1.7–7.7)
Neutrophils Relative %: 77 %
Platelets: 207 10*3/uL (ref 150–400)
RBC: 3.6 MIL/uL — ABNORMAL LOW (ref 3.87–5.11)
WBC: 4.6 10*3/uL (ref 4.0–10.5)
nRBC: 0 % (ref 0.0–0.2)
nRBC: 0 /100 WBC

## 2021-06-15 LAB — GLUCOSE, CAPILLARY
Glucose-Capillary: 109 mg/dL — ABNORMAL HIGH (ref 70–99)
Glucose-Capillary: 115 mg/dL — ABNORMAL HIGH (ref 70–99)
Glucose-Capillary: 123 mg/dL — ABNORMAL HIGH (ref 70–99)
Glucose-Capillary: 124 mg/dL — ABNORMAL HIGH (ref 70–99)
Glucose-Capillary: 134 mg/dL — ABNORMAL HIGH (ref 70–99)
Glucose-Capillary: 95 mg/dL (ref 70–99)

## 2021-06-15 LAB — COMPREHENSIVE METABOLIC PANEL
ALT: 15 U/L (ref 0–44)
AST: 23 U/L (ref 15–41)
Albumin: 2.7 g/dL — ABNORMAL LOW (ref 3.5–5.0)
Alkaline Phosphatase: 142 U/L — ABNORMAL HIGH (ref 38–126)
Anion gap: 7 (ref 5–15)
BUN: 34 mg/dL — ABNORMAL HIGH (ref 6–20)
CO2: 21 mmol/L — ABNORMAL LOW (ref 22–32)
Calcium: 9.5 mg/dL (ref 8.9–10.3)
Chloride: 104 mmol/L (ref 98–111)
Creatinine, Ser: 1.14 mg/dL — ABNORMAL HIGH (ref 0.44–1.00)
GFR, Estimated: 60 mL/min — ABNORMAL LOW (ref >60)
Glucose, Bld: 116 mg/dL — ABNORMAL HIGH (ref 70–99)
Potassium: 4.1 mmol/L (ref 3.5–5.1)
Sodium: 132 mmol/L — ABNORMAL LOW (ref 135–145)
Total Bilirubin: 0.4 mg/dL (ref 0.3–1.2)
Total Protein: 6.8 g/dL (ref 6.5–8.1)

## 2021-06-15 LAB — MAGNESIUM: Magnesium: 1.9 mg/dL (ref 1.7–2.4)

## 2021-06-15 LAB — PHOSPHORUS: Phosphorus: 4.2 mg/dL (ref 2.5–4.6)

## 2021-06-15 MED ORDER — FREE WATER
100.0000 mL | Freq: Four times a day (QID) | Status: DC
  Administered 2021-06-15 – 2021-06-21 (×24): 100 mL

## 2021-06-15 NOTE — Progress Notes (Signed)
Occupational Therapy Treatment Patient Details Name: Holly Bradley MRN: [id number] DOB: 05-04-1976 Today's Date: 06/15/2021    History of present illness 45 yo female s/p mechanical thrombectomy on 5/24 of right MCA M1/M2/M3 and embolization of right MCA M3 branch due to contrast extravasation. Pt re-intubated 5/26. Repeat head CT on 5/26 showed increased cytotoxic edema and mildly increased leftward midline shift, now 5 mm, decreased SAH and IVH. Abdominal CT showing 22.9 cm mass, possibly consistent with uterine fibroids, no lymphadenopathy. s/p trach 6/3. PMH fibriods HTN HF paranoid psychosis recent hospitalizations january and february 2022   OT comments  Session conducted in conjunction with PT with pt able to tolerate maximove transfer OOB to recliner with BP 125/70 from recliner with legs elevated. Pt currently requires total A for LB ADLs and MAX A for UB ADLs. Pt writing full sentences during session and nodding/ gesturing appropriately during session. Applied K tape to pts LUE to assist with managing subluxation. Pt would continue to benefit from skilled occupational therapy while admitted and after d/c to address the below listed limitations in order to improve overall functional mobility and facilitate independence with BADL participation. DC plan remains appropriate, will follow acutely per POC.    Follow Up Recommendations  SNF    Equipment Recommendations  Wheelchair (measurements OT);Wheelchair cushion (measurements OT);Hospital bed;3 in 1 bedside commode    Recommendations for Other Services      Precautions / Restrictions Precautions Precautions: Fall;Other (comment) Precaution Comments: L hemiparesis, PRAFO,  resting hand splint,  trach, SBP < 160, cortrak Required Braces or Orthoses: Other Brace Other Brace: L resting hand splint (4 hours on/4 hours off) Restrictions Weight Bearing Restrictions: No       Mobility Bed Mobility Overal bed mobility: Needs  Assistance Bed Mobility: Rolling Rolling: Max assist;+2 for physical assistance         General bed mobility comments: max +2 for rolling bilaterally for trunk and LE managmeent, safe placement L shoulder in protraction. Pt soiled heavily in stool, urine, and menses upon arrival requiring clean up assist.    Transfers Overall transfer level: Needs assistance               General transfer comment: maximove OOB, truncal support and midline positioning assist during transfer    Balance Overall balance assessment: Needs assistance Sitting-balance support: Single extremity supported;Feet supported Sitting balance-Leahy Scale: Poor Sitting balance - Comments: min-mod posterior assist, preference for L lateral leaning Postural control: Posterior lean;Left lateral lean                                 ADL either performed or assessed with clinical judgement   ADL Overall ADL's : Needs assistance/impaired             Lower Body Bathing: Total assistance;Bed level Lower Body Bathing Details (indicate cue type and reason): simulated via posterior pericare Upper Body Dressing : Maximal assistance;Bed level Upper Body Dressing Details (indicate cue type and reason): to don new gown from bed level       Toilet Transfer Details (indicate cue type and reason): maximoved pt to chair Toileting- Clothing Manipulation and Hygiene: Total assistance;Bed level;+2 for physical assistance Toileting - Clothing Manipulation Details (indicate cue type and reason): Total A +2 for peri care and rolling     Functional mobility during ADLs: Total assistance;+2 for physical assistance (dependent transfer to recliner) General ADL Comments: pt able to tolerate OOB  maximove transfer to Magazine features editor      Cognition Arousal/Alertness: Awake/alert Behavior During Therapy: WFL for tasks assessed/performed Overall Cognitive Status: Difficult to  assess Area of Impairment: Following commands;Attention;Safety/judgement;Problem solving                   Current Attention Level: Sustained   Following Commands: Follows one step commands with increased time Safety/Judgement: Decreased awareness of safety Awareness: Emergent Problem Solving: Slow processing;Requires verbal cues;Requires tactile cues;Difficulty sequencing General Comments: Pt writing to PT/OT appropriately during session, also consistently nodding yes/no well. Pt awake, alert, smiling appropriately during session.        Exercises Other Exercises Other Exercises: seated tasks in recliner: trunk righting to midline with mirror input, Kinesiotape to LUE donned by COTA and LUE approximated by PT   Shoulder Instructions       General Comments BP 125/70 sitting up in recliner, pt on RA at end of session with SpO2 99-100% HR < 100 bpm during session, applied K tape to pts L shoulder    Pertinent Vitals/ Pain       Pain Assessment: Faces Faces Pain Scale: Hurts little more Pain Location: LUE, during bed mobility and application of k-tape Pain Descriptors / Indicators: Grimacing;Discomfort;Sore Pain Intervention(s): Limited activity within patient's tolerance;Monitored during session;Repositioned  Home Living                                          Prior Functioning/Environment              Frequency  Min 2X/week        Progress Toward Goals  OT Goals(current goals can now be found in the care plan section)  Progress towards OT goals: Progressing toward goals  Acute Rehab OT Goals Patient Stated Goal: none stated Time For Goal Achievement: 06/25/21 Potential to Achieve Goals: [place] Discharge plan remains appropriate;Frequency remains appropriate    Co-evaluation      Reason for Co-Treatment: Complexity of the patient's impairments (multi-system involvement);For patient/therapist safety;To address functional/ADL  transfers PT goals addressed during session: Mobility/safety with mobility;Balance;Strengthening/ROM OT goals addressed during session: ADL's and self-care      AM-PAC OT "6 Clicks" Daily Activity     Outcome Measure   Help from another person eating meals?: A Lot Help from another person taking care of personal grooming?: A Lot Help from another person toileting, which includes using toliet, bedpan, or urinal?: Total Help from another person bathing (including washing, rinsing, drying)?: Total Help from another person to put on and taking off regular upper body clothing?: Total Help from another person to put on and taking off regular lower body clothing?: Total 6 Click Score: 8    End of Session Equipment Utilized During Treatment: Other (comment) (maximove)  OT Visit Diagnosis: Unsteadiness on feet (R26.81);Muscle weakness (generalized) (M62.81);Hemiplegia and hemiparesis Hemiplegia - Right/Left: Left Hemiplegia - dominant/non-dominant: Non-Dominant Hemiplegia - caused by: Cerebral infarction   Activity Tolerance Patient tolerated treatment well   Patient Left in chair;with call bell/phone within reach;with chair alarm set   Nurse Communication Mobility status;Need for lift equipment;Other (comment) (tolerance to OOB transfer, BP WFL)        Time: 1660-6301 OT Time Calculation (min): 50 min  Charges: OT General Charges $OT Visit: 1 Visit OT Treatments $  Therapeutic Activity: 23-37 mins  [name], COTA/L Acute Rehabilitation Services [place] 06/15/2021, 4:48 PM

## 2021-06-15 NOTE — Progress Notes (Signed)
Order for trach change was received. By the time materials were at bedside it was past 1700. Lurline Idol  will be changed on 6/25.

## 2021-06-15 NOTE — Progress Notes (Addendum)
Brief progress note.  Pt. Assessed for down sizing trach to cuffless 4.0. She is sitting up in chair. She is awake and writing notes. She is following simple commands.  She has PMV in at present.  She has a strong cough, clear secretions. Moderate amount noted.  She can phonate when prompted to do so.  She needs to be reminded to use her voice.   Plan Down Size to 4.0 cuff less trach [employer] Keep capped ( red occlusive cap) 24 hours/ day Document desaturations , and need for suctioning Continuous oxygen saturation monitoring Nursing to encourage patient to self suction her mouth Encourage patient to vocalize / phonate her needs.  We will plan to see this weekend x 1  ( Saturday) to ensure she is tolerating well. Call for any needs in the interim.   25 minutes APP time     Magdalen Spatz, MSN, AGACNP-BC [facility] for personal pager PCCM on call pager [phone]  06/15/2021 3:59 PM

## 2021-06-15 NOTE — Progress Notes (Signed)
[facility] PROGRESS NOTE  Holly Bradley MRN:[id number] DOB: 10/29/1976 DOA: 05/15/2021 PCP: Christian, Rylee, MD  Status: Remains inpatient appropriate because:Altered mental status, Unsafe d/c plan, and Inpatient level of care appropriate due to severity of illness  Dispo: The patient is from: Home              Anticipated d/c is to: SNF              Patient currently is not medically stable to d/c.   Difficult to place patient Yes   Level of care: Med-Surg  Code Status: Full code except for no CPR Family Communication: Patient only DVT prophylaxis: SCDs with history of hemorrhagic stroke COVID vaccination status: Unknown    HPI: 45 y.o. female past medical history of essential hypertension, diabetes mellitus type 2 large uterine fibroids paroxysmal SVT, chronic diastolic heart failure paranoid psychosis comes into the ED on 05/16/2019 with confusion was found to have a right MCA occlusion underwent thrombectomy which was complicated by contrast extravasation and M3 coiling embolization and revascularization.  She subsequently developed subarachnoid hemorrhage repeat imaging revealed right basal ganglia infarct with a 3 mm left shift, she was treated with hypertonic saline in the ICU, on 05/17/2021 she developed respiratory distress and he was intubated CT of the head showed increased cytotoxic edema and left side shift.  No mental status improvement is now trach and with nasal collar track for tube feedings.  She has been treated with Ancef and completed a 7-day course for MSSA pneumonia, her blood cultures on 05/21/2021 were positive for staph epi.  Infectious disease was consulted and are on board she has remained off antibiotics since 2022 repeated blood cultures on 06/02/2021 have remained negative.  5/24 Admit to neurology after ED visit with AMS, found to have R MCA stroke, underwent mechanical thrombectomy, M3 coil embolization, subsequent SAH 5/26 decreased mental status and  respiratory distress, intubated 6/1 still not candidate for extubation with poor mentation and cough mechanics. Family unsure if pt would want trach. Staph epi bacteremia, continues on ancef 6/2 moving RUE RLE following some commands. decision to move forward with tracheostomy.   6/3 perc trach. Slight WBC increase but no fever, continues on ancef 6/4 Tolerating PSV/CPAP 6/5 WBC up to 17, last day of ancef for staph pna. Trying ATC  6/9 ID consulted for staph epi bacteremia . Felt contamination and stopped abx. 6/12 had fever spikes but nothing to explain  6/13 change to cuffless trach   Subjective: Alert.  Sitting up in bed working with SLP.  Note patient writing questions and responses on paper on bedside table with right hand.  Smiles appropriately and is very interactive with conversation at hand.  Objective: Vitals:   06/15/21 0032 06/15/21 0330  BP:  118/61  Pulse:  73  Resp:  18  Temp: 99 F (37.2 C) 97.8 F (36.6 C)  SpO2:  99%    Intake/Output Summary (Last 24 hours) at 06/15/2021 0814 Last data filed at 06/15/2021 0509 Gross per 24 hour  Intake --  Output 1300 ml  Net -1300 ml   Filed Weights   06/08/21 0428 06/12/21 0458 06/15/21 0411  Weight: 71.5 kg 71.1 kg 73.3 kg    Exam:  Constitutional: Alert and calm and in no acute distress Respiratory: 6.0 cuffless trach to humidified trach collar, no increased work of breathing.  Was spontaneous coughing once again expectorated sputum.  O2 saturations 99 to 100% Cardiovascular: S1-S2, no obvious peripheral edema or JVD, regular   pulse.  Normotensive Abdomen:  LBM 6/22, abdomen soft and nontender.  On nocturnal tube feedings Neurologic: CN 2-12 grossly intact except for left facial droop when smiling.  Sensation intact on right side but diminished on the left side with patient reporting sharp sensation as dull.  DTR normal.  Strength 3+-4/5 on the right with purposeful movement.  Dense hemiplegia involving left side.   Strong forceful spontaneous cough. Psychiatric: AOx3, able to communicate accurately via the written word.  Pleasant affect and smiles.   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  Initial hypercoagulable panel suggestive of lupus anticoagulant but recommendation is to repeat hypercoagulable panel in 12 weeks. Continue aspirin and Lipitor. Remains alert, able to write out thoughts and requests on paper. Continue PT/OT and SLP Given significant hemiplegia will require aggressive PT/OT to determine if will eventually be able to ambulate but likely will be wheelchair-bound Order written for out of bed to chair 3 times daily  Cytotoxic cerebral edema/hypernatremia Initially treated with 3% saline subsequently switched to free water Patient with initial hyponatremia with improvement in sodium with neurological team recommending slow correction of sodium CT head stable Sodium normalized with most recent reading 137 on 6/15 Continue free water per tube  Hypertension/grade 2 diastolic dysfunction Echocardiogram this admission reveals preserved LVEF with grade 2 diastolic dysfunction Continue carvedilol, hydralazine, Isordil, Entresto, Catapres and Norvasc Currently euvolemic  Dysphagia due to recent stroke SLP reevaluated the patient with diet recommendations as per current order PEG tube placement will be difficult given significant size of uterine fibroid Patient reports that she can feel trach feeding tube and this is causing her to have some difficulty swallowing Continue nocturnal tube feedings until oral intake improves and calorie count yielded about a 30% intake of orals over 3 days   Acute respiratory failure with hypoxia status post tracheostomy/MSSA pneumonia Currently being followed by PCCM trach team Currently has  6.0 cuffless trach and likely will downsize-has strong productive cough Continue PMV and if continues to do well may be able to begin capping trials Completed 7 days of cefazolin  Fever of unknown source/resolved: Blood cultures on 5/30 with staph epidermis to have hominis-both likely contaminants Repeat blood cultures on 6/6 with staph epidermis Repeat blood cultures on 6/11 negative No fever for the past 9 days  Thrombocytosis: Acting as an acute phase reactant with platelets trending downward 531,000 as of 6/16 Check labs.   Acute blood loss anemia secondary to large uterine fibroids: Extremely large palpable on exam OB/GYN was consulted they recommended to continue Megace. OB/GYN reevaluated.  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   Hemoglobin as above  Protein calorie malnutrition Body mass index is 26.89 kg/m.  Continue dysphagia 1 diet with nectar thick liquids Continue nocturnal tube feedings with Jevity and Prosource per tube  History of paroxysmal SVT: Continue home amiodarone dose.   Preop evaluation: Cardiology was consulted and deemed her intermediate risk for cardiopulmonary complications, they recommended no further cardiac work-up at this time. Recommended to monitor postop and intraoperative fluid volume. Monitor on telemetry for signs of arrhythmia.   Goals of care: She has a very poor prognosis   the brother has unrealistic expectations about outcomes. Will ask palliative care to continue to work with them as the patient quality of life will be poor as she will be bedbound      Data Reviewed: Basic Metabolic Panel: Recent Labs  Lab 06/15/21 0642  NA 132*  K 4.1  CL 104  CO2 21*  GLUCOSE 116*  BUN 34*  CREATININE 1.14*   CALCIUM 9.5  MG 1.9  PHOS 4.2   Liver Function Tests: Recent Labs  Lab 06/15/21 0642  AST 23  ALT 15  ALKPHOS 142*  BILITOT 0.4  PROT 6.8  ALBUMIN 2.7*   No results for input(s): LIPASE, AMYLASE in the last 168 hours. No results for input(s): AMMONIA in the last 168 hours. CBC: No results for input(s): WBC, NEUTROABS, HGB, HCT, MCV, PLT in the last 168 hours.  Cardiac Enzymes: No results for input(s): CKTOTAL, CKMB, CKMBINDEX, TROPONINI in the last 168 hours. BNP (last 3 results) No results for input(s): BNP in the last 8760 hours.  ProBNP (last 3 results) No results for input(s): PROBNP in the last 8760 hours.  CBG: Recent Labs  Lab 06/14/21 0809 06/14/21 1154 06/14/21 1942 06/15/21 0000 06/15/21 0455  GLUCAP 94 98 105* 134* 124*    No results found for this or any previous visit (from the past 240 hour(s)).   Studies: No results found.  Scheduled Meds:  amiodarone  200 mg Per Tube Daily   amLODipine  10 mg Per Tube Daily   aspirin  81 mg Per Tube Daily   atorvastatin  40 mg Per Tube Daily   carvedilol  25 mg Per Tube BID WC   chlorhexidine gluconate (MEDLINE KIT)  15 mL Mouth Rinse BID   Chlorhexidine Gluconate Cloth  6 each Topical Daily   cloNIDine  0.1 mg Per Tube TID   feeding supplement (JEVITY 1.5 CAL/FIBER)  900 mL Per Tube Q24H   feeding supplement (PROSource TF)  45 mL Per Tube Daily   ferrous sulfate  300 mg Per Tube BID WC   free water  200 mL Per Tube Q6H   hydrALAZINE  100 mg Per Tube Q8H   isosorbide dinitrate  40 mg Per Tube TID   mouth rinse  15 mL Mouth Rinse 10 times per day   megestrol  80 mg Per Tube TID   multivitamin with minerals  1 tablet Per Tube Daily   pantoprazole sodium  40 mg Per Tube Daily   sacubitril-valsartan  1 tablet Per Tube BID   sodium chloride flush  10-40 mL Intracatheter Q12H   Continuous Infusions:  Principal Problem:   Cerebrovascular accident (CVA) (HCC) Active Problems:   Acute respiratory failure  with hypoxia (HCC)   Fibroid   Iron deficiency anemia due to chronic blood loss   Cerebral edema (HCC)   Chronic HFrEF (heart failure with reduced ejection fraction) (HCC)   Pneumonia due to methicillin susceptible Staphylococcus aureus (HCC)   Abdominal distention   Encounter for feeding tube placement   Encounter for intubation   Ileus (HCC)   Staphylococcus epidermidis bacteremia   Status post tracheostomy (HCC)   Abnormal uterine bleeding (AUB)   Consultants: PCCM Palliative medicine OB-GYN CIR General surgery Infectious disease  Procedures: Echocardiogram IR transcatheter embolization on 5/24 EEG IR placement of tracheostomy on 6/3  Antibiotics: Cefazolin 5/30 through 6/5   Time spent: 45 minutes      ANP  [facility] 7 am - 330 pm/M-F for direct patient care   and secure chat Please refer to Amion for contact info 31  days

## 2021-06-15 NOTE — Progress Notes (Signed)
Physical Therapy Treatment Patient Details Name: Holly Bradley MRN: [id number] DOB: [birth year] Today's Date: 06/15/2021    History of Present Illness 45 yo female s/p mechanical thrombectomy on 5/24 of right MCA M1/M2/M3 and embolization of right MCA M3 branch due to contrast extravasation. Pt re-intubated 5/26. Repeat head CT on 5/26 showed increased cytotoxic edema and mildly increased leftward midline shift, now 5 mm, decreased SAH and IVH. Abdominal CT showing 22.9 cm mass, possibly consistent with uterine fibroids, no lymphadenopathy. s/p trach 6/3. PMH fibriods HTN HF paranoid psychosis recent hospitalizations january and february 2022    PT Comments    Pt motivated to get OOB to recliner today. Initial PT and COTA plan was to move to EOB sitting, work on seated balance, and lift to recliner, but pt soiled heavily in urine, stool, and feces requiring 10+ minutes bed mobility for clean up. Given pt fatigability, pt lifted to recliner. Once up in recliner, pt taken to rehab gym for kinesiotaping LUE and seated balance tasks with mirror use for external cues. Overall pt continuing to require max +2 assist, but is completely cognitively appropriate with nodding yes/no and writing to PT and COTA. PT to continue to follow acutely.    Follow Up Recommendations  SNF     Equipment Recommendations  Wheelchair (measurements PT);Wheelchair cushion (measurements PT);Hospital bed;Other (comment) (hoyer lift)    Recommendations for Other Services       Precautions / Restrictions Precautions Precautions: Fall;Other (comment) Precaution Comments: L hemiparesis, PRAFO,  resting hand splint,  trach, SBP < 160, cortrak Other Brace: L resting hand splint (4 hours on/4 hours off) Restrictions Weight Bearing Restrictions: No    Mobility  Bed Mobility Overal bed mobility: Needs Assistance Bed Mobility: Rolling Rolling: Max assist;+2 for physical assistance         General bed mobility  comments: max +2 for rolling bilaterally for trunk and LE managmeent, safe placement L shoulder in protraction. Pt soiled heavily in stool, urine, and menses upon arrival requiring clean up assist.    Transfers Overall transfer level: Needs assistance               General transfer comment: maximove OOB, truncal support and midline positioning assist during transfer  Ambulation/Gait                 Stairs             Wheelchair Mobility    Modified Rankin (Stroke Patients Only) Modified Rankin (Stroke Patients Only) Pre-Morbid Rankin Score: No symptoms Modified Rankin: Severe disability     Balance Overall balance assessment: Needs assistance Sitting-balance support: Single extremity supported;Feet supported Sitting balance-Leahy Scale: Poor Sitting balance - Comments: min-mod posterior assist, preference for L lateral leaning Postural control: Posterior lean;Left lateral lean                                  Cognition Arousal/Alertness: Awake/alert Behavior During Therapy: WFL for tasks assessed/performed Overall Cognitive Status: Difficult to assess Area of Impairment: Following commands;Attention;Safety/judgement;Problem solving                   Current Attention Level: Sustained   Following Commands: Follows one step commands with increased time Safety/Judgement: Decreased awareness of safety Awareness: Emergent Problem Solving: Slow processing;Requires verbal cues;Requires tactile cues;Difficulty sequencing General Comments: Pt writing to PT/OT appropriately during session, also consistently nodding yes/no well. Pt awake, alert, smiling appropriately during  session.      Exercises Other Exercises Other Exercises: seated tasks in recliner: trunk righting to midline with mirror input, Kinesiotape to LUE donned by COTA and LUE approximated by PT    General Comments General comments (skin integrity, edema, etc.): BP 125/70  sitting up in recliner at end of session      Pertinent Vitals/Pain Pain Assessment: Faces Faces Pain Scale: Hurts little more Pain Location: LUE, during bed mobility and application of k-tape Pain Descriptors / Indicators: Grimacing;Discomfort;Sore Pain Intervention(s): Limited activity within patient's tolerance;Monitored during session;Repositioned    Home Living                      Prior Function            PT Goals (current goals can now be found in the care plan section) Acute Rehab PT Goals PT Goal Formulation: With patient Time For Goal Achievement: 06/18/21 Potential to Achieve Goals: Fair Progress towards PT goals: Progressing toward goals    Frequency    Min 3X/week      PT Plan Current plan remains appropriate    Co-evaluation PT/OT/SLP Co-Evaluation/Treatment: Yes Reason for Co-Treatment: For patient/therapist safety;To address functional/ADL transfers;Complexity of the patient's impairments (multi-system involvement) PT goals addressed during session: Mobility/safety with mobility;Balance;Strengthening/ROM        AM-PAC PT "6 Clicks" Mobility   Outcome Measure  Help needed turning from your back to your side while in a flat bed without using bedrails?: Total Help needed moving from lying on your back to sitting on the side of a flat bed without using bedrails?: Total Help needed moving to and from a bed to a chair (including a wheelchair)?: Total Help needed standing up from a chair using your arms (e.g., wheelchair or bedside chair)?: Total Help needed to walk in hospital room?: Total Help needed climbing 3-5 steps with a railing? : Total 6 Click Score: 6    End of Session   Activity Tolerance: Patient tolerated treatment well Patient left: with call bell/phone within reach;in chair;with chair alarm set;with nursing/sitter in room Nurse Communication: Mobility status PT Visit Diagnosis: Hemiplegia and hemiparesis;Other abnormalities of  gait and mobility (R26.89);Unsteadiness on feet (R26.81);Muscle weakness (generalized) (M62.81);Difficulty in walking, not elsewhere classified (R26.2);Other symptoms and signs involving the nervous system (R29.898);Apraxia (R48.2) Hemiplegia - Right/Left: Left Hemiplegia - dominant/non-dominant: Non-dominant Hemiplegia - caused by: Cerebral infarction;Nontraumatic SAH;Nontraumatic intracerebral hemorrhage     Time: 8119-1478 PT Time Calculation (min) (ACUTE ONLY): 51 min  Charges:  $Therapeutic Activity: 8-22 mins                    Stacie Glaze, PT DPT Acute Rehabilitation Services Pager [phone]  Office [phone]    Louis Matte 06/15/2021, 3:44 PM

## 2021-06-15 NOTE — Progress Notes (Signed)
  Speech Language Pathology Treatment: Dysphagia;Cognitive-Linquistic  Patient Details Name: Holly Bradley MRN: [id number] DOB: [birth year] Today's Date: 06/15/2021 Time: 5093-2671 SLP Time Calculation (min) (ACUTE ONLY): 55 min  Assessment / Plan / Recommendation Clinical Impression  Pt has made excellent gains this week from a cognitive, communication, and swallowing perspective.  She is much more interactive and when paper/pen are present she has asked appropriate questions and provided information about her history. Due to her left neglect, she presents with delayed responses and poorer initiation when conversation partner is on that side.  Therapeutically, it is helpful to work with her from the left; however, if medical team is present and engaging her in conversation about her care/goals, recommend approaching her from the right side to maximize participation.   During our session this morning, Holly Bradley fed herself a container of yogurt with improved lip seal on left.  She drank nectar liquids from the edge of a cup with better lip seal/less spillage and improved attention to the spills, spontaneously cleaning her mouth with a towel.  There were no concerns for aspiration.   She recorded her intake and calories/protein on paper, signed her name with a smiley face and I put it in the calorie count envelope on her door.  With PMV in place for duration of session, she was able to phonate on command on two occasions today with verbal/visual cues.    She is tolerating the PMV well. VS stable when it is in place. Recommend she use it all waking hours to improve opportunities for phonation/cough. Remove when sleeping and provide intermittent supervision.  D/W RN.  Will follow toward goals.    HPI HPI: Pt is a [name] who presented with AMS and slurred speech after being found down. MRI showing moderately large acute right MCA infarct with associated petechial hemorrhage, SAH, and minimal  IVH. S/p mechanical thrombectomy of M1/M2/M3 and embolization of right MCA M3 branch. Significant PMH: uncontrolled HTN, tobacco use disorder, HF, paroxysmal SVT, paranoid psychosis. Required intubation s/p trach 6/3, changed to cuffless 6/16. MBS 6/21 start dys1/nectar.  Pt pending PEG however large uterine mass disrupting planned placement, and goals of care are unclear at this time      SLP Plan  Continue with current plan of care       Recommendations  Diet recommendations: Dysphagia 1 (puree);Nectar-thick liquid Liquids provided via: Straw;Cup Medication Administration: Crushed with puree Supervision: Staff to assist with self feeding;Full supervision/cueing for compensatory strategies Compensations: Minimize environmental distractions      Patient may use Passy-Muir Speech Valve: During all waking hours (remove during sleep) PMSV Supervision: Intermittent         Oral Care Recommendations: Oral care BID Follow up Recommendations: Skilled Nursing facility SLP Visit Diagnosis: Dysphagia, oropharyngeal phase (R13.12);Cognitive communication deficit (R41.841) Plan: Continue with current plan of care       [place]. [name], [place] CCC/SLP Acute Rehabilitation Services Office number [phone] Pager [phone]  [name] 06/15/2021, 10:01 AM

## 2021-06-15 NOTE — Plan of Care (Signed)
  Problem: Education: Goal: Knowledge of disease or condition will improve Outcome: Progressing Goal: Knowledge of secondary prevention will improve Outcome: Progressing   

## 2021-06-15 NOTE — Progress Notes (Signed)
Calorie Count Note  48 hour calorie count ordered.  Diet: dysphagia 1 with nectar thick liquids Supplements: Magic cup TID with meals, each supplement provides 290 kcal and 9 grams of protein   Pt lying in bed at time of visit, able to communicate with head nods. Pt consumed about 25% of waffle and 10% of scrambled eggs.   Discussed findings with MD and NP. Will continue with nocturnal feeds. Pt will likely need permanent feeding access.   6/22 Breakfast: 217 kcals, 12.5g protein Lunch: no meal documentation provided Dinner: 195 kcals, 5g protein Supplements: 145 kcals, 4.5g protein  Day 1 Total Intake: 557 kcal (30% of minimum estimated needs)  22g protein (24% of minimum estimated needs)  6/23  Breakfast: 73 kcals, 3 grams protein Lunch: 492 kcals, 18 grams protein Dinner: no meal documentation provided  Day 2 Total intake: 565 kcal (31% of minimum estimated needs)  21 grams protein (23% of minimum estimated needs)  Average Total intake: 561 kcal (30% of minimum estimated needs)  22 grams protein (24% of minimum estimated needs)  Nutrition Dx: Inadequate oral intake related to inability to eat as evidenced by NPO status. - ongoing   Goal: Patient will meet greater than or equal to 90% of their needs - addressing with supplements and tube feeding  Intervention:   -D/c calorie count -Continue nocturnal TF via Cortrak: Jevity 1.5 @ 75 ml/hr administered over 12 hours from 1800-0600 ProSource TF daily 200 ml of free water QID   Nocturnal TF regimen provides 1390 kcals, 68g protein, 63ml free water (145ml total free water with flushes) Meets ~75% minimum estimated calorie and protein needs  Loistine Chance, RD, LDN, [place] Registered Dietitian II Certified Diabetes Care and Education Specialist Please refer to [facility] for RD and/or RD on-call/weekend/after hours pager

## 2021-06-16 DIAGNOSIS — I5032 Chronic diastolic (congestive) heart failure: Secondary | ICD-10-CM

## 2021-06-16 DIAGNOSIS — I10 Essential (primary) hypertension: Secondary | ICD-10-CM

## 2021-06-16 LAB — GLUCOSE, CAPILLARY
Glucose-Capillary: 110 mg/dL — ABNORMAL HIGH (ref 70–99)
Glucose-Capillary: 114 mg/dL — ABNORMAL HIGH (ref 70–99)
Glucose-Capillary: 114 mg/dL — ABNORMAL HIGH (ref 70–99)
Glucose-Capillary: 87 mg/dL (ref 70–99)
Glucose-Capillary: 91 mg/dL (ref 70–99)
Glucose-Capillary: 91 mg/dL (ref 70–99)

## 2021-06-16 NOTE — Progress Notes (Signed)
PROGRESS NOTE    Trysta Showman  URK:[id number] DOB: 02-Jun-1976 DOA: 05/15/2021 PCP: Mitzi Hansen, MD   Brief Narrative:  45 y.o. female past medical history of essential hypertension, diabetes mellitus type 2 large uterine fibroids paroxysmal SVT, chronic diastolic heart failure paranoid psychosis comes into the ED on 05/16/2019 with confusion was found to have a right MCA occlusion underwent thrombectomy which was complicated by contrast extravasation and M3 coiling embolization and revascularization.  She subsequently developed subarachnoid hemorrhage repeat imaging revealed right basal ganglia infarct with a 3 mm left shift, she was treated with hypertonic saline in the ICU, on 05/17/2021 she developed respiratory distress and he was intubated CT of the head showed increased cytotoxic edema and left side shift.  No mental status improvement is now trach and with nasal collar track for tube feedings.  She has been treated with Ancef and completed a 7-day course for MSSA pneumonia, her blood cultures on 05/21/2021 were positive for staph epi.  Infectious disease was consulted and are on board she has remained off antibiotics since 2022 repeated blood cultures on 06/02/2021 have remained negative.   5/24 Admit to neurology after ED visit with AMS, found to have R MCA stroke, underwent mechanical thrombectomy, M3 coil embolization, subsequent SAH 5/26 decreased mental status and respiratory distress, intubated 6/1 still not candidate for extubation with poor mentation and cough mechanics. Family unsure if pt would want trach. Staph epi bacteremia, continues on ancef 6/2 moving RUE RLE following some commands. decision to move forward with tracheostomy.   6/3 perc trach. Slight WBC increase but no fever, continues on ancef 6/4 Tolerating PSV/CPAP 6/5 WBC up to 17, last day of ancef for staph pna. Trying ATC  6/9 ID consulted for staph epi bacteremia . Felt contamination and stopped abx. 6/12 had  fever spikes but nothing to explain  6/13 change to cuffless trach    Assessment & Plan:   Principal Problem:   Cerebrovascular accident (CVA) ([place]) Active Problems:   Acute respiratory failure with hypoxia (HCC)   Fibroid   Iron deficiency anemia due to chronic blood loss   Cerebral edema (HCC)   Chronic HFrEF (heart failure with reduced ejection fraction) ([place])   Pneumonia due to methicillin susceptible Staphylococcus aureus ([place])   Abdominal distention   Encounter for feeding tube placement   Encounter for intubation   Ileus ([place])   Staphylococcus epidermidis bacteremia   Status post tracheostomy ([place])   Abnormal uterine bleeding (AUB)     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  Initial hypercoagulable panel suggestive of lupus anticoagulant but recommendation is to repeat hypercoagulable panel in 12 weeks. Continue aspirin and Lipitor. Remains alert, able to write out thoughts and requests on paper. Continue PT/OT and SLP Given significant hemiplegia will require aggressive PT/OT to determine if will eventually be able to ambulate but likely will be wheelchair-bound Order written for out of bed to chair 3 times daily   Cytotoxic cerebral edema/hypernatremia Initially treated with 3% saline subsequently switched to free water Patient with initial hyponatremia with improvement in sodium with neurological team recommending slow correction of sodium CT head stable Sodium normalized with most recent reading 137 on 6/15 Continue free water per tube   Hypertension/grade 2 diastolic dysfunction Echocardiogram this admission reveals preserved LVEF with grade 2 diastolic dysfunction Continue carvedilol, hydralazine, Isordil, Entresto, Catapres and Norvasc Currently euvolemic   Dysphagia  due to recent stroke SLP reevaluated the patient with diet  recommendations as per current order PEG tube placement will be difficult given significant size of uterine fibroid Patient reports that she can feel trach feeding tube and this is causing her to have some difficulty swallowing Continue nocturnal tube feedings until oral intake improves and calorie count yielded about a 30% intake of orals over 3 days   Acute respiratory failure with hypoxia status post tracheostomy/MSSA pneumonia Currently being followed by PCCM trach team Currently has 6.0 cuffless trach and likely will downsize-has strong productive cough Continue PMV and if continues to do well may be able to begin capping trials Completed 7 days of cefazolin  Fever of unknown source/resolved: Blood cultures on 5/30 with staph epidermis to have hominis-both likely contaminants Repeat blood cultures on 6/6 with staph epidermis Repeat blood cultures on 6/11 negative No fever for the past 9 days  Thrombocytosis: Acting as an acute phase reactant with platelets trending downward 531,000 as of 6/16 Check labs.   Acute blood loss anemia secondary to large uterine fibroids: Extremely large palpable on exam OB/GYN was consulted they recommended to continue Megace. OB/GYN reevaluated.  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   Hemoglobin as above  Protein calorie malnutrition Body mass index is 26.89 kg/m.  Continue dysphagia 1 diet with nectar thick liquids Continue nocturnal tube feedings with Jevity and Prosource per tube  History of paroxysmal SVT: Continue home amiodarone dose.   Preop evaluation: Cardiology was consulted and deemed her intermediate risk for cardiopulmonary complications, they recommended no further cardiac work-up at this  time. Recommended to monitor postop and intraoperative fluid volume. Monitor on telemetry for signs of arrhythmia.   Goals of care: She has a very poor prognosis the brother has unrealistic expectations about outcomes. Will ask palliative care to continue to work with them as the patient quality of life will be poor as she will be bedbound  DVT prophylaxis: SCD/Compression stockings  Code Status: PARTIAL    Code Status Orders  (From admission, onward)           Start     Ordered   05/30/21 1101  Limited resuscitation (code)  Continuous       Question Answer Comment  In the event of cardiac or respiratory ARREST: Initiate Code Blue, Call Rapid Response Yes   In the event of cardiac or respiratory ARREST: Perform CPR No   In the event of cardiac or respiratory ARREST: Perform Intubation/Mechanical Ventilation Yes   In the event of cardiac or respiratory ARREST: Use NIPPV/BiPAp only if indicated Yes   In the event of cardiac or respiratory ARREST: Administer ACLS medications if indicated Yes   In the event of cardiac or respiratory ARREST: Perform Defibrillation or Cardioversion if indicated Yes      05/30/21 1101           Code Status History     Date Active Date Inactive Code Status Order ID Comments User Context   05/15/2021 0754 05/30/2021 1101 Full Code [id number]  Greta Doom, MD ED      Family Communication: PATIENT ONLY PER PRIOR RECORDS Disposition Plan:   SNF Consults called: None Admission status: Inpatient Remains inpatient appropriate because:Altered mental status, Unsafe d/c plan, and  Inpatient level of care appropriate due to severity of illness   Dispo: The patient is from: Home              Anticipated d/c is to: SNF              Patient currently is not medically stable to d/c.              Difficult to place patient Yes  Consultants:  PCCM Palliative medicine OB-GYN CIR General surgery Infectious disease  Procedures:  CT ABDOMEN  PELVIS W WO CONTRAST  Result Date: 05/17/2021 CLINICAL DATA:  Abdominal distention with pain. EXAM: CT ABDOMEN AND PELVIS WITHOUT AND WITH CONTRAST TECHNIQUE: Multidetector CT imaging of the abdomen and pelvis was performed following the standard protocol before and following the bolus administration of intravenous contrast. CONTRAST:  41m OMNIPAQUE IOHEXOL 300 MG/ML  SOLN COMPARISON:  None. FINDINGS: Lower chest: Marked cardiomegaly with left base collapse/consolidation. Hepatobiliary: Liver measures 20 cm craniocaudal length, enlarged. 7 mm hypodensity in the posterior right liver is too small to characterize. High density material in the gallbladder lumen likely vicarious excretion of contrast material. Insert no biliary Pancreas: No focal mass lesion. No dilatation of the main duct. No intraparenchymal cyst. No peripancreatic edema. Spleen: No splenomegaly. No focal mass lesion. Adrenals/Urinary Tract: No adrenal nodule or mass. Cortical scarring noted in both kidneys 4 mm nonobstructing stone noted lower pole right kidney. Bladder appears to be decompressed. Stomach/Bowel: Feeding tube tip is in the distal stomach, at the pylorus. No small bowel dilatation. Small bowel and colon are markedly displaced due to an extremely large abdominopelvic mass. No colonic dilatation. Vascular/Lymphatic: There is abdominal aortic atherosclerosis without aneurysm. There is no gastrohepatic or hepatoduodenal ligament lymphadenopathy. No retroperitoneal or mesenteric lymphadenopathy. No pelvic sidewall lymphadenopathy. Reproductive: 22.9 x 22.9 x 13.8 cm soft tissue mass fills the pelvis and lower abdomen. This has numerous round areas of hypoperfusion scattered throughout. Imaging appearance is similar to prior. Origin of this lesion cannot be determined but may well be uterine. Due to mass-effect, neither ovary is clearly identified. Other: Trace free fluid is noted in the pelvis. 3.3 x 3.4 cm focus of soft tissue  attenuation is identified in the right abdomen on image 37 of series 9, indeterminate but apparently present on the prior study. Musculoskeletal: There is some body wall edema in the abdomen and pelvis. No worrisome lytic or sclerotic osseous abnormality. IMPRESSION: 1. Extremely large abdominopelvic mass measuring up to 22.9 cm. Origin of this lesion cannot be determined but may well be uterine. Due to mass-effect, neither ovary is clearly identified. 2. No evidence for lymphadenopathy in the abdomen or pelvis. 3. Marked cardiomegaly with left base collapse/consolidation. 4. 4 mm nonobstructing stone lower pole right kidney. 5. Aortic Atherosclerosis (ICD10-I70.0). Electronically Signed   By: EMisty StanleyM.D.   On: 05/17/2021 17:57   CT HEAD WO CONTRAST  Result Date: 05/21/2021 CLINICAL DATA:  Follow-up examination for stroke. EXAM: CT HEAD WITHOUT CONTRAST TECHNIQUE: Contiguous axial images were obtained from the base of the skull through the vertex without intravenous contrast. COMPARISON:  Prior CT from 05/19/2021. FINDINGS: Brain: Continued interval evolution of cytotoxic edema involving the right frontoparietal region and right basal ganglia, consistent with right MCA distribution infarct. Overall size and extent is relatively stable. Localized edema with partial effacement of the right lateral ventricle and 5 mm of right-to-left shift, not significantly changed. No hydrocephalus or trapping. No evidence for new or interval hemorrhage. Underlying chronic  microvascular ischemic disease with chronic left cerebellar and brainstem infarcts again noted. No other visible new area of infarction apparent vague parenchymal hypodensity overlying the high anterior frontal lobes near the vertex on the non corrected axial images favored to be artifactual due to patient positioning (series 3, image 24). No extra-axial collection. Vascular: No hyperdense vessel. Right MCA branch embolization coil again noted. Skull: No  new or progressive finding. Sinuses/Orbits: Globes and orbital soft tissues demonstrate no new finding. Nasogastric tube in place with scattered mucosal thickening throughout the sphenoid ethmoidal sinuses. Other: None. IMPRESSION: 1. Continued interval evolution of right MCA distribution infarct, stable in size and extent from previous. Associated edema with 5 mm of right-to-left midline shift, not significantly changed. No hydrocephalus or trapping. No new intracranial hemorrhage. 2. No other new acute intracranial abnormality. Electronically Signed   By: Jeannine Boga M.D.   On: 05/21/2021 04:54   CT HEAD WO CONTRAST  Result Date: 05/19/2021 CLINICAL DATA:  Stroke follow-up EXAM: CT HEAD WITHOUT CONTRAST TECHNIQUE: Contiguous axial images were obtained from the base of the skull through the vertex without intravenous contrast. COMPARISON:  Two days ago FINDINGS: Brain: Right basal ganglia and frontal parietal cortex infarcts which are in the MCA territory. Local mass effect without midline shift. Confluent chronic small vessel ischemia. Remote left cerebellar and brainstem infarcts. No acute hemorrhage. Vascular: Right MCA branch embolization coil. Skull: Normal. Negative for fracture or focal lesion. Sinuses/Orbits: Negative IMPRESSION: 1. Recent right MCA territory infarction without progressive finding or interval hemorrhage. 2. Chronic ischemic injury elsewhere in the brain. Electronically Signed   By: Monte Fantasia M.D.   On: 05/19/2021 11:11   CT HEAD WO CONTRAST  Result Date: 05/17/2021 CLINICAL DATA:  Stroke follow-up. Increased work of breathing. Status post mechanical thrombectomy for treatment of right M1 occlusion. EXAM: CT HEAD WITHOUT CONTRAST TECHNIQUE: Contiguous axial images were obtained from the base of the skull through the vertex without intravenous contrast. COMPARISON:  Noncontrast head CT 05/16/2021 and MRI 05/15/2021 FINDINGS: Brain: A moderately large right MCA infarct  is again seen with progressive cytotoxic edema including extensive involvement of the basal ganglia. There is partial effacement of the right lateral ventricle with mildly increased leftward midline shift, now measuring 5 mm. Small volume subarachnoid hemorrhage near the posterior aspect of the right sylvian fissure and in adjacent frontoparietal sulci has decreased. Trace intraventricular hemorrhage is also less conspicuous. There is a background of extensive chronic small vessel ischemia in the cerebral white matter bilaterally. Chronic lacunar infarcts are again noted in the left basal ganglia and pons. There is an unchanged chronic infarct superiorly in the left cerebellar hemisphere. Hypodensity more anteriorly in the left cerebellar hemisphere is similar to the prior CT and remains indeterminate for an acute infarct or artifact. Vascular: Calcified atherosclerosis at the skull base. Embolization of a right M3 branch vessel. Skull: No fracture suspicious osseous lesion. Sinuses/Orbits: Paranasal sinuses and mastoid air cells are clear. Unremarkable orbits. Other: Left periorbital soft tissue swelling. IMPRESSION: 1. Evolving right MCA infarct with increased cytotoxic edema and mildly increased leftward midline shift, now 5 mm. 2. Decreased subarachnoid and intraventricular hemorrhage. 3. Persistent hypodensity in the left cerebellar hemisphere, indeterminate for acute infarct versus artifact. Electronically Signed   By: Logan Bores M.D.   On: 05/17/2021 20:55   CT Angio Chest Pulmonary Embolism (PE) W or WO Contrast  Result Date: 06/03/2021 CLINICAL DATA:  Shortness of breath EXAM: CT ANGIOGRAPHY CHEST WITH CONTRAST TECHNIQUE: Multidetector CT imaging  of the chest was performed using the standard protocol during bolus administration of intravenous contrast. Multiplanar CT image reconstructions and MIPs were obtained to evaluate the vascular anatomy. CONTRAST:  74m OMNIPAQUE IOHEXOL 350 MG/ML SOLN  COMPARISON:  January 11, 2021. FINDINGS: Cardiovascular: Satisfactory opacification of the pulmonary arteries to the segmental level. No evidence of pulmonary embolism. Cardiomegaly. Small pericardial effusion. Mediastinum/Nodes: Scattered thyroid nodules, the largest of which measures 12 mm. No mediastinal hilar adenopathy. No axillary adenopathy. Lungs/Pleura: Tracheostomy. LEFT basilar atelectasis. No pleural effusion or pneumothorax. Upper Abdomen: Enteric tube tip terminates in the proximal duodenum. Musculoskeletal: No chest wall abnormality. No acute or significant osseous findings. Review of the MIP images confirms the above findings. IMPRESSION: 1. No evidence of pulmonary embolism. 2. Cardiomegaly. 3. Scattered thyroid nodules. Not clinically significant; no follow-up imaging recommended (ref: J Am Coll Radiol. 2015 Feb;12(2): 143-50). Electronically Signed   By: SValentino SaxonMD   On: 06/03/2021 17:39   DG CHEST PORT 1 VIEW  Result Date: 06/02/2021 CLINICAL DATA:  Fever. EXAM: PORTABLE CHEST 1 VIEW COMPARISON:  05/30/2021 FINDINGS: Stable enlarged cardiac silhouette. Tracheostomy tube in satisfactory position. Feeding tube extending into the stomach. Dense left lower lobe airspace consolidation is again demonstrated. Mild patchy density at the medial right lung base. Unremarkable bones. IMPRESSION: 1. Stable dense left lower lobe atelectasis or pneumonia. 2. Interval mild patchy atelectasis or pneumonia at the medial right lung base. 3. Stable cardiomegaly. Electronically Signed   By: SClaudie ReveringM.D.   On: 06/02/2021 15:55   DG Chest Port 1 View  Result Date: 05/30/2021 CLINICAL DATA:  Fever. EXAM: PORTABLE CHEST 1 VIEW COMPARISON:  05/25/2021. FINDINGS: Tracheostomy tube and feeding tube in stable position. Cardiomegaly. No pulmonary venous congestion. Left lower lobe atelectasis/consolidation again noted. Small left pleural effusion. No pneumothorax. IMPRESSION: 1.  Tracheostomy tube and  feeding tube in stable position. 2.  Cardiomegaly.  No pulmonary venous congestion. 3. Left lower lobe atelectasis/consolidation again noted. Small left pleural effusion. Electronically Signed   By: TMarcello Moores Register   On: 05/30/2021 09:14   DG Chest Port 1 View  Result Date: 05/25/2021 CLINICAL DATA:  Status post tracheostomy. EXAM: PORTABLE CHEST 1 VIEW COMPARISON:  05/21/2021 FINDINGS: Interval placement of tracheostomy tube with tip approximately 4 cm above the carina. No pneumothorax or pneumomediastinum. Right lung remains clear. Cardiopericardial silhouette is enlarged. Left base collapse/consolidation is similar to prior. A feeding tube passes into the stomach although the distal tip position is not included on the film. Telemetry leads overlie the chest. IMPRESSION: Interval placement of tracheostomy tube with tip approximately 4 cm above the carina. Electronically Signed   By: EMisty StanleyM.D.   On: 05/25/2021 10:33   DG CHEST PORT 1 VIEW  Result Date: 05/21/2021 CLINICAL DATA:  Fever, altered mental status EXAM: PORTABLE CHEST 1 VIEW COMPARISON:  05/17/2021 FINDINGS: Nasogastric tube coursing below the diaphragm projecting over the stomach with the tip excluded from the field of view. Endotracheal tube with the tip 6.6 cm above the carina. Left basilar airspace disease. Otherwise no focal parenchymal opacity. No pleural effusion or pneumothorax. Stable cardiomegaly. No acute osseous abnormality. IMPRESSION: 1. Nasogastric tube coursing below the diaphragm projecting over the stomach with the tip excluded from the field of view. 2. Endotracheal tube with the tip 6.6 cm above the carina. Recommend advancing the endotracheal tube 2 cm. 3. Left lower lobe airspace disease which may reflect atelectasis versus pneumonia. Electronically Signed   By: HKathreen Devoid  On: 05/21/2021 11:28   DG CHEST PORT 1 VIEW  Result Date: 05/17/2021 CLINICAL DATA:  Respiratory failure EXAM: PORTABLE CHEST 1 VIEW  COMPARISON:  6:17 p.m. FINDINGS: Endotracheal tube is seen 3.9 cm above the carina. Nasoenteric feeding tube extends into the upper abdomen beyond the margin of the examination. Left basilar opacification may reflect the presence of a small left pleural effusion. The lungs are otherwise clear. No pneumothorax. No pleural effusion on the right. Mild to moderate cardiomegaly is unchanged. The pulmonary vascularity is normal. No acute bone abnormality. IMPRESSION: Support tubes in appropriate position. Possible small left pleural effusion.  Stable cardiomegaly. Electronically Signed   By: Fidela Salisbury MD   On: 05/17/2021 21:12   DG Chest Port 1 View  Result Date: 05/17/2021 CLINICAL DATA:  45 year old female status post intubation EXAM: PORTABLE CHEST 1 VIEW COMPARISON:  Chest radiograph dated 02/01/2021. FINDINGS: Endotracheal tube with tip approximately 4.7 cm above the carina. Feeding tube extends below the diaphragm with tip beyond the inferior margin of the image. There is cardiomegaly with mild vascular congestion. No focal consolidation, pleural effusion, or pneumothorax. No acute osseous pathology. IMPRESSION: 1. Endotracheal tube above the carina. 2. Cardiomegaly with mild vascular congestion. Electronically Signed   By: Anner Crete M.D.   On: 05/17/2021 18:26   DG Abd Portable 1V  Result Date: 05/30/2021 CLINICAL DATA:  Ileus. EXAM: PORTABLE ABDOMEN - 1 VIEW COMPARISON:  05/26/2021. FINDINGS: Feeding tube noted with tip over the stomach. No bowel distention. No free air. Large abdominopelvic mass again noted. Reference is made to CT report of 05/17/2021. IMPRESSION: 1. Feeding tube noted with tip over the stomach. No bowel distention. 2. Large abdominopelvic mass again noted. Reference is made to CT report of 05/17/2021. Electronically Signed   By: Marcello Moores  Register   On: 05/30/2021 12:46   DG Abd Portable 1V  Result Date: 05/26/2021 CLINICAL DATA:  Abdominal distension EXAM: PORTABLE ABDOMEN  - 1 VIEW COMPARISON:  Portable exam 1908 hours compared to 05/16/2021 FINDINGS: Tip of feeding tube projects over distal gastric antrum. Nonobstructive bowel gas pattern. Osseous structures unremarkable. Atelectasis versus consolidation LEFT lower lobe. IMPRESSION: Tip of feeding tube projects over distal gastric antrum. Atelectasis versus consolidation LEFT lower lobe. Electronically Signed   By: Lavonia Dana M.D.   On: 05/26/2021 20:12   DG Swallowing Func-Speech Pathology  Result Date: 06/12/2021 Formatting of this result is different from the original. Objective Swallowing Evaluation: Type of Study: MBS-Modified Barium Swallow Study  Patient Details Name: Abygayle Deltoro MRN: [id number] Date of Birth: [birth year] Today's Date: 06/12/2021 Time: SLP Start Time (ACUTE ONLY): 28 -SLP Stop Time (ACUTE ONLY): 1310 SLP Time Calculation (min) (ACUTE ONLY): 40 min Past Medical History: Past Medical History: Diagnosis Date  Hypertension  Past Surgical History: Past Surgical History: Procedure Laterality Date  IR ANGIOGRAM FOLLOW UP STUDY  05/15/2021  IR CT HEAD LTD  05/15/2021  IR PERCUTANEOUS ART THROMBECTOMY/INFUSION INTRACRANIAL INC DIAG ANGIO  05/15/2021     IR PERCUTANEOUS ART THROMBECTOMY/INFUSION INTRACRANIAL INC DIAG ANGIO  05/15/2021  IR TRANSCATH/EMBOLIZ  05/15/2021  RADIOLOGY WITH ANESTHESIA N/A 05/15/2021  Procedure: IR WITH ANESTHESIA;  Surgeon: Radiologist, Medication, MD;  Location: [place];  Service: Radiology;  Laterality: N/A; HPI: Pt is a [name] who presented with AMS and slurred speech after being found down. MRI showing moderately large acute right MCA infarct with associated petechial hemorrhage, SAH, and minimal IVH. S/p mechanical thrombectomy of M1/M2/M3 and embolization of right MCA M3 branch.  Significant PMH: uncontrolled HTN, tobacco use disorder, HF, paroxysmal SVT, paranoid psychosis. Required intubation s/p trach 6/3, changed to cuffless 6/16. Pt pending PEG however large uterine mass  disrupting planned placement, and goals of care are unclear at this time  Subjective: Alert and participatory Assessment / Plan / Recommendation CHL IP CLINICAL IMPRESSIONS 06/12/2021 Clinical Impression Pt presented with a much more successful swallow than anticipated -  her dysphagia is relatively mild.  PMV was in place for the study; she followed all commands; was unable to communicate verbally but used gestures/facial expressions. Pt demonstrated mild deficits with oral control, lip seal on left, and premature loss of thin liquids over base of tongue. Thin liquids reached the pyriform sinuses and occasionally a trace amount spilled into the larynx PRIOR TO swallow onset - there was no immediate cough response, but after 30 seconds a cough occurred. Pt protected her airway reliably with nectar thick liquids, purees, and a cracker - there was consistent laryngeal vestibule closure (no penetration/aspiration).  Also notable was strong pharyngeal squeeze with no residue remaining in pharynx after the swallows.  Recommend beginning an oral diet and allowing Ms. Livesay an opportunity to [place].  REC: dysphagia 2; nectar thick liquids; meds crushed in puree.  Pt will need to be sitting as upright as possible; fully supervise all PO intake and assist her with self-feeding as able.  Reduce TF per dietician.  Use PMV with PO intake.  SLP will follow for safety/diet progression. SLP Visit Diagnosis Dysphagia, oropharyngeal phase (R13.12) Attention and concentration deficit following -- Frontal lobe and executive function deficit following -- Impact on safety and function Mild aspiration risk   [facility] IP TREATMENT RECOMMENDATION 06/12/2021 Treatment Recommendations Therapy as outlined in treatment plan below   Prognosis 06/12/2021 Prognosis for Safe Diet Advancement Good Barriers to Reach Goals -- Barriers/Prognosis Comment -- [facility] IP DIET RECOMMENDATION 06/12/2021 SLP Diet Recommendations Dysphagia 2 (Fine chop)  solids;Nectar thick liquid Liquid Administration via Cup;Straw Medication Administration Crushed with puree Compensations Minimize environmental distractions Postural Changes Seated upright at 90 degrees   [facility] IP OTHER RECOMMENDATIONS 06/12/2021 Recommended Consults -- Oral Care Recommendations Oral care BID Other Recommendations Order thickener from pharmacy   [facility] IP FOLLOW UP RECOMMENDATIONS 06/08/2021 Follow up Recommendations Inpatient Rehab;Skilled Nursing facility   [facility] IP FREQUENCY AND DURATION 06/12/2021 Speech Therapy Frequency (ACUTE ONLY) min 3x week Treatment Duration 2 weeks      [facility] IP ORAL PHASE 06/12/2021 Oral Phase Impaired Oral - Pudding Teaspoon -- Oral - Pudding Cup -- Oral - Honey Teaspoon -- Oral - Honey Cup -- Oral - Nectar Teaspoon -- Oral - Nectar Cup -- Oral - Nectar Straw -- Oral - Thin Teaspoon -- Oral - Thin Cup -- Oral - Thin Straw -- Oral - Puree -- Oral - Mech Soft -- Oral - Regular Piecemeal swallowing Oral - Multi-Consistency -- Oral - Pill -- Oral Phase - Comment --  [facility] IP PHARYNGEAL PHASE 06/12/2021 Pharyngeal Phase Impaired Pharyngeal- Pudding Teaspoon -- Pharyngeal -- Pharyngeal- Pudding Cup -- Pharyngeal -- Pharyngeal- Honey Teaspoon -- Pharyngeal -- Pharyngeal- Honey Cup -- Pharyngeal -- Pharyngeal- Nectar Teaspoon -- Pharyngeal -- Pharyngeal- Nectar Cup Delayed swallow initiation-vallecula Pharyngeal -- Pharyngeal- Nectar Straw Delayed swallow initiation-vallecula Pharyngeal -- Pharyngeal- Thin Teaspoon -- Pharyngeal -- Pharyngeal- Thin Cup Delayed swallow initiation-pyriform sinuses Pharyngeal -- Pharyngeal- Thin Straw Delayed swallow initiation-pyriform sinuses;Penetration/Aspiration before swallow Pharyngeal Material enters airway, passes BELOW cords without attempt by patient to eject out (silent aspiration) Pharyngeal- Puree WFL Pharyngeal --  Pharyngeal- Mechanical Soft -- Pharyngeal -- Pharyngeal- Regular WFL Pharyngeal -- Pharyngeal- Multi-consistency -- Pharyngeal --  Pharyngeal- Pill -- Pharyngeal -- Pharyngeal Comment --  No flowsheet data found. [name] 06/12/2021, 2:23 PM              EEG adult  Result Date: 05/18/2021 Lora Havens, MD     05/18/2021  1:05 PM Patient Name: Beretta Ginsberg MRN: [id number] Epilepsy Attending: Lora Havens Referring Physician/Provider: Charlene Brooke, NP Date: 05/18/2021 Duration: 23.25 mins Patient history: 45yo F with right MCA infarct. EEG to evaluate for seizure. Level of alertness: Awake, asleep AEDs during EEG study: Propofol Technical aspects: This EEG study was done with scalp electrodes positioned according to the 10-20 International system of electrode placement. Electrical activity was acquired at a sampling rate of '500Hz'  and reviewed with a high frequency filter of '70Hz'  and a low frequency filter of '1Hz' . EEG data were recorded continuously and digitally stored. Description: The posterior dominant rhythm consists of 7 Hz activity of moderate voltage (25-35 uV) seen predominantly in posterior head regions, symmetric and reactive to eye opening and eye closing.  Sleep was characterized by sleep spindles (12 to 14 Hz), maximal frontocentral region. EEG showed continuous generalized and lateralized right hemisphere predominantly 5 to 6 Hz theta as well as intermittent 2 to 3 Hz delta slowing.  Hyperventilation and photic stimulation were not performed.   ABNORMALITY - Continuous slow, generalized and lateralized right hemisphere -Background slow IMPRESSION: This study is suggestive of cortical dysfunction in right hemisphere likely secondary to underlying infarct as well as moderate diffuse encephalopathy, nonspecific etiology.  No seizures or definite epileptiform discharges were seen throughout the recording. Priyanka O Yadav   VAS [place] TRANSCRANIAL DOPPLER W BUBBLES  Result Date: 05/25/2021  Transcranial Doppler with Bubble Patient Name:  MILTA CROSON  Date of Exam:   05/18/2021 Medical Rec #:  [id number]         Accession #:    [id number] Date of Birth: 11-Aug-1976         Patient Gender: F Patient Age:   50Y Exam Location:  [facility] Procedure:      VAS [place] TRANSCRANIAL Candise Bowens Referring Phys: [id number] [place] --------------------------------------------------------------------------------  Indications: Stroke. Limitations: Patient intubated and unable to perform proper Valsalva maneuver. Comparison Study: No prior study Performing Technologist: Maudry Mayhew MHA, RDMS, RVT, RDCS  Examination Guidelines: A complete evaluation includes B-mode imaging, spectral Doppler, color Doppler, and power Doppler as needed of all accessible portions of each vessel. Bilateral testing is considered an integral part of a complete examination. Limited examinations for reoccurring indications may be performed as noted.  Summary: No HITS at rest or during manual Valsalva. Negative transcranial Doppler Bubble study with no evidence of right to left intracardiac communication.  A vascular evaluation was performed. The left middle cerebral artery was studied. An IV was inserted into the patient's right forearm.  *See table(s) above for TCD measurements and observations.  Diagnosing physician: Rosalin Hawking MD Electronically signed by Rosalin Hawking MD on 05/25/2021 at 2:08:19 PM.    Final     Antimicrobials:  Cefazolin 5/30 through 6/5    Subjective: INTERACTIVE, NO ACUTE EVENTS OVENRIGHT  Objective: Vitals:   06/16/21 0857 06/16/21 1047 06/16/21 1137 06/16/21 1543  BP:   135/70   Pulse: 89 77 76 86  Resp: '15 16 15 17  ' Temp:   99.3 F (37.4 C)   TempSrc:   Oral  SpO2: 99% 99% 98% 98%  Weight:      Height:        Intake/Output Summary (Last 24 hours) at 06/16/2021 1600 Last data filed at 06/16/2021 0815 Gross per 24 hour  Intake 80 ml  Output 300 ml  Net -220 ml   Filed Weights   06/12/21 0458 06/15/21 0411 06/16/21 0500  Weight: 71.1 kg 73.3 kg 70 kg     Examination:  Constitutional: Alert and calm and in no acute distress Respiratory: 6.0 cuffless trach to humidified trach collar, no increased work of breathing.  Was spontaneous coughing once again expectorated sputum.  O2 saturations 99 to 100% Cardiovascular: S1-S2, no obvious peripheral edema or JVD, regular pulse.  Normotensive Abdomen:  LBM 6/22, abdomen soft and nontender.  On nocturnal tube feedings Neurologic: CN 2-12 grossly intact except for left facial droop when smiling.  Sensation intact on right side but diminished on the left side with patient reporting sharp sensation as dull.  DTR normal.  Strength 3+-4/5 on the right with purposeful movement.  Dense hemiplegia involving left side.  Strong forceful spontaneous cough. Psychiatric: AOx3, able to communicate accurately via the written word.  Pleasant affect and smiles.    Data Reviewed: I have personally reviewed following labs and imaging studies  CBC: Recent Labs  Lab 06/15/21 0642  WBC 4.6  NEUTROABS 3.5  HGB 9.0*  HCT 29.8*  MCV 82.8  PLT 491   Basic Metabolic Panel: Recent Labs  Lab 06/15/21 0642  NA 132*  K 4.1  CL 104  CO2 21*  GLUCOSE 116*  BUN 34*  CREATININE 1.14*  CALCIUM 9.5  MG 1.9  PHOS 4.2   GFR: Estimated Creatinine Clearance: 59.9 mL/min (A) (by C-G formula based on SCr of 1.14 mg/dL (H)). Liver Function Tests: Recent Labs  Lab 06/15/21 0642  AST 23  ALT 15  ALKPHOS 142*  BILITOT 0.4  PROT 6.8  ALBUMIN 2.7*   No results for input(s): LIPASE, AMYLASE in the last 168 hours. No results for input(s): AMMONIA in the last 168 hours. Coagulation Profile: No results for input(s): INR, PROTIME in the last 168 hours. Cardiac Enzymes: No results for input(s): CKTOTAL, CKMB, CKMBINDEX, TROPONINI in the last 168 hours. BNP (last 3 results) No results for input(s): PROBNP in the last 8760 hours. HbA1C: No results for input(s): HGBA1C in the last 72 hours. CBG: Recent Labs  Lab  06/15/21 2050 06/16/21 0043 06/16/21 0415 06/16/21 0817 06/16/21 1238  GLUCAP 115* 114* 110* 87 91   Lipid Profile: No results for input(s): CHOL, HDL, LDLCALC, TRIG, CHOLHDL, LDLDIRECT in the last 72 hours. Thyroid Function Tests: No results for input(s): TSH, T4TOTAL, FREET4, T3FREE, THYROIDAB in the last 72 hours. Anemia Panel: No results for input(s): VITAMINB12, FOLATE, FERRITIN, TIBC, IRON, RETICCTPCT in the last 72 hours. Sepsis Labs: No results for input(s): PROCALCITON, LATICACIDVEN in the last 168 hours.  No results found for this or any previous visit (from the past 240 hour(s)).       Radiology Studies: No results found.      Scheduled Meds:  amiodarone  200 mg Per Tube Daily   amLODipine  10 mg Per Tube Daily   aspirin  81 mg Per Tube Daily   atorvastatin  40 mg Per Tube Daily   carvedilol  25 mg Per Tube BID WC   chlorhexidine gluconate (MEDLINE KIT)  15 mL Mouth Rinse BID   Chlorhexidine Gluconate Cloth  6 each Topical Daily  cloNIDine  0.1 mg Per Tube TID   feeding supplement (JEVITY 1.5 CAL/FIBER)  900 mL Per Tube Q24H   feeding supplement (PROSource TF)  45 mL Per Tube Daily   ferrous sulfate  300 mg Per Tube BID WC   free water  100 mL Per Tube Q6H   hydrALAZINE  100 mg Per Tube Q8H   isosorbide dinitrate  40 mg Per Tube TID   mouth rinse  15 mL Mouth Rinse 10 times per day   megestrol  80 mg Per Tube TID   multivitamin with minerals  1 tablet Per Tube Daily   pantoprazole sodium  40 mg Per Tube Daily   sacubitril-valsartan  1 tablet Per Tube BID   sodium chloride flush  10-40 mL Intracatheter Q12H   Continuous Infusions:   LOS: 32 days    Time spent: [place]    Nicolette Bang, MD [facility]  If 7PM-7AM, please contact night-coverage  06/16/2021, 4:00 PM

## 2021-06-16 NOTE — Progress Notes (Signed)
   06/16/21 1543  Therapy Vitals  Pulse Rate 86  Resp 17  Patient Position (if appropriate) Lying  MEWS Score/Color  MEWS Score 0  MEWS Score Color Green  Respiratory Assessment  Assessment Type Assess only  Respiratory Pattern Regular  Chest Assessment Chest expansion symmetrical  Cough Productive  Sputum Amount Small  Sputum Color White  Sputum Consistency Thick  Sputum Specimen Source Spontaneous cough  Bilateral Breath Sounds Clear;Diminished  Oxygen Therapy/Pulse Ox  O2 Device Room Air  SpO2 98 %  Tracheostomy Shiley Flexible 4 mm Uncuffed  Placement Date/Time: 06/16/21 1044   Placed By: Self  Brand: Shiley Flexible  Size (mm): 4 mm  Style: Uncuffed  Status Capped  Site Assessment Clean;Dry  Ties Assessment Clean;Dry;Secure  Tracheostomy Equipment at bedside Yes and checklist posted at head of bed

## 2021-06-16 NOTE — Progress Notes (Signed)
PCCM interval progress note  Patient down sized to cuffless 4 this am and started capping trials.   Currently patient lying in bed, in NAD with trach capped.  No difficulty with breathing or secretions.    Continuous pulse ox stable at 98% on room air  Patient easily arouses to verbal, f/c RUE, very resistant to phonation- not able to get her to vocalize today.  No stridor.  Lungs clear.  No secretions at this time.    P:  Continue to monitor closely during capping trials. Aggressive PT/ OT- encourage OOB SLP following Document desaturations , and need for suctioning Continuous oxygen saturation monitoring Nursing to encourage patient to self suction her mouth Encourage patient to vocalize / phonate her needs.   PCCM will continue to follow for trach needs.   Kennieth Rad, ACNP  Pulmonary & Critical Care 06/16/2021, 12:31 PM

## 2021-06-16 NOTE — Progress Notes (Signed)
Trach changed to #4 Shiley Flexible Cuffless trach per MD order. Positive CO2 exchange and BBS noted. Patient also started on capping trials. Patient is tolerating well and this time and no complications noted.

## 2021-06-17 LAB — GLUCOSE, CAPILLARY
Glucose-Capillary: 111 mg/dL — ABNORMAL HIGH (ref 70–99)
Glucose-Capillary: 114 mg/dL — ABNORMAL HIGH (ref 70–99)
Glucose-Capillary: 121 mg/dL — ABNORMAL HIGH (ref 70–99)
Glucose-Capillary: 122 mg/dL — ABNORMAL HIGH (ref 70–99)
Glucose-Capillary: 87 mg/dL (ref 70–99)
Glucose-Capillary: 97 mg/dL (ref 70–99)

## 2021-06-17 MED ORDER — HYDROMORPHONE HCL 1 MG/ML IJ SOLN: 1.0000 mg | Freq: Once | INTRAMUSCULAR | Status: DC

## 2021-06-17 MED ORDER — HYDROMORPHONE HCL 1 MG/ML IJ SOLN
1.0000 mg | Freq: Once | INTRAMUSCULAR | Status: AC
  Administered 2021-06-17: 1 mg via INTRAVENOUS
  Filled 2021-06-17: qty 1

## 2021-06-17 MED ORDER — ONDANSETRON HCL 4 MG/2ML IJ SOLN
4.0000 mg | Freq: Four times a day (QID) | INTRAMUSCULAR | Status: DC | PRN
  Administered 2021-06-19: 4 mg via INTRAVENOUS
  Filled 2021-06-17: qty 2

## 2021-06-17 MED ORDER — HYDROMORPHONE HCL 1 MG/ML IJ SOLN
1.0000 mg | INTRAMUSCULAR | Status: DC | PRN
Start: 2021-06-17 — End: 2021-06-18
  Administered 2021-06-17: 1 mg via INTRAVENOUS
  Filled 2021-06-17: qty 1

## 2021-06-17 NOTE — Progress Notes (Signed)
PROGRESS NOTE    Holly Bradley  CWC:[id number] DOB: 1976/02/12 DOA: 05/15/2021 PCP: Mitzi Hansen, MD   Brief Narrative:  45 y.o. female past medical history of essential hypertension, diabetes mellitus type 2 large uterine fibroids paroxysmal SVT, chronic diastolic heart failure paranoid psychosis comes into the ED on 05/16/2019 with confusion was found to have a right MCA occlusion underwent thrombectomy which was complicated by contrast extravasation and M3 coiling embolization and revascularization.  She subsequently developed subarachnoid hemorrhage repeat imaging revealed right basal ganglia infarct with a 3 mm left shift, she was treated with hypertonic saline in the ICU, on 05/17/2021 she developed respiratory distress and he was intubated CT of the head showed increased cytotoxic edema and left side shift.  No mental status improvement is now trach and with nasal collar track for tube feedings.  She has been treated with Ancef and completed a 7-day course for MSSA pneumonia, her blood cultures on 05/21/2021 were positive for staph epi.  Infectious disease was consulted and are on board she has remained off antibiotics since 2022 repeated blood cultures on 06/02/2021 have remained negative.   5/24 Admit to neurology after ED visit with AMS, found to have R MCA stroke, underwent mechanical thrombectomy, M3 coil embolization, subsequent SAH 5/26 decreased mental status and respiratory distress, intubated 6/1 still not candidate for extubation with poor mentation and cough mechanics. Family unsure if pt would want trach. Staph epi bacteremia, continues on ancef 6/2 moving RUE RLE following some commands. decision to move forward with tracheostomy.   6/3 perc trach. Slight WBC increase but no fever, continues on ancef 6/4 Tolerating PSV/CPAP 6/5 WBC up to 17, last day of ancef for staph pna. Trying ATC  6/9 ID consulted for staph epi bacteremia . Felt contamination and stopped abx. 6/12 had  fever spikes but nothing to explain  6/13 change to cuffless trach    Assessment & Plan:   Principal Problem:   Cerebrovascular accident (CVA) ([place]) Active Problems:   Acute respiratory failure with hypoxia (HCC)   Fibroid   Iron deficiency anemia due to chronic blood loss   Cerebral edema (HCC)   Chronic HFrEF (heart failure with reduced ejection fraction) ([place])   Pneumonia due to methicillin susceptible Staphylococcus aureus ([place])   Abdominal distention   Encounter for feeding tube placement   Encounter for intubation   Ileus ([place])   Staphylococcus epidermidis bacteremia   Status post tracheostomy ([place])   Abnormal uterine bleeding (AUB)    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  Initial hypercoagulable panel suggestive of lupus anticoagulant but recommendation is to repeat hypercoagulable panel in 12 weeks. Continue aspirin and Lipitor. Remains alert, able to write out thoughts and requests on paper. Continue PT/OT and SLP Given significant hemiplegia will require aggressive PT/OT to determine if will eventually be able to ambulate but likely will be wheelchair-bound Order written for out of bed to chair 3 times daily   Cytotoxic cerebral edema/hypernatremia Initially treated with 3% saline subsequently switched to free water Patient with initial hyponatremia with improvement in sodium with neurological team recommending slow correction of sodium CT head stable Sodium normalized with most recent reading 137 on 6/15 Continue free water per tube   Hypertension/grade 2 diastolic dysfunction Echocardiogram this admission reveals preserved LVEF with grade 2 diastolic dysfunction Continue carvedilol, hydralazine, Isordil, Entresto, Catapres and Norvasc Currently euvolemic   Dysphagia due  to recent stroke SLP reevaluated the patient with diet  recommendations as per current order PEG tube placement will be difficult given significant size of uterine fibroid Patient reports that she can feel trach feeding tube and this is causing her to have some difficulty swallowing Continue nocturnal tube feedings until oral intake improves and calorie count yielded about a 30% intake of orals over 3 days   Acute respiratory failure with hypoxia status post tracheostomy/MSSA pneumonia Currently being followed by PCCM trach team Currently has 6.0 cuffless trach and likely will downsize-has strong productive cough Continue PMV and if continues to do well may be able to begin capping trials Completed 7 days of cefazolin  Fever of unknown source/resolved: Blood cultures on 5/30 with staph epidermis to have hominis-both likely contaminants Repeat blood cultures on 6/6 with staph epidermis Repeat blood cultures on 6/11 negative No fever for the past 9 days  Thrombocytosis: Acting as an acute phase reactant with platelets trending downward 531,000 as of 6/16 Check labs.   Acute blood loss anemia secondary to large uterine fibroids: Extremely large palpable on exam OB/GYN was consulted they recommended to continue Megace. OB/GYN reevaluated.  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   Hemoglobin as above  Protein calorie malnutrition Body mass index is 26.89 kg/m.  Continue dysphagia 1 diet with nectar thick liquids Continue nocturnal tube feedings with Jevity and Prosource per tube  History of paroxysmal SVT: Continue home amiodarone dose.   Preop evaluation: Cardiology was consulted and deemed her intermediate risk for cardiopulmonary complications, they recommended no further cardiac work-up at this  time. Recommended to monitor postop and intraoperative fluid volume. Monitor on telemetry for signs of arrhythmia.   Goals of care: She has a very poor prognosis the brother has unrealistic expectations about outcomes. Will ask palliative care to continue to work with them as the patient quality of life will be poor as she will be bedbound   DVT prophylaxis: SCD/Compression stockings  Code Status: PARTIAL    Code Status Orders  (From admission, onward)           Start     Ordered   05/30/21 1101  Limited resuscitation (code)  Continuous       Question Answer Comment  In the event of cardiac or respiratory ARREST: Initiate Code Blue, Call Rapid Response Yes   In the event of cardiac or respiratory ARREST: Perform CPR No   In the event of cardiac or respiratory ARREST: Perform Intubation/Mechanical Ventilation Yes   In the event of cardiac or respiratory ARREST: Use NIPPV/BiPAp only if indicated Yes   In the event of cardiac or respiratory ARREST: Administer ACLS medications if indicated Yes   In the event of cardiac or respiratory ARREST: Perform Defibrillation or Cardioversion if indicated Yes      05/30/21 1101           Code Status History     Date Active Date Inactive Code Status Order ID Comments User Context   05/15/2021 0754 05/30/2021 1101 Full Code [id number]  Greta Doom, MD ED      Family Communication: PATIENT ONLY PER PRIOR RECORDS Disposition Plan:   SNF Consults called: None Admission status: Inpatient Remains inpatient appropriate because:Altered mental status, Unsafe d/c plan, and  Inpatient level of care appropriate due to severity of illness   Dispo: The patient is from: Home              Anticipated d/c is to: SNF              Patient currently is not medically stable to d/c.              Difficult to place patient Yes   Consultants:  PCCM Palliative medicine OB-GYN CIR General surgery Infectious disease  Procedures:  CT HEAD WO  CONTRAST  Result Date: 05/21/2021 CLINICAL DATA:  Follow-up examination for stroke. EXAM: CT HEAD WITHOUT CONTRAST TECHNIQUE: Contiguous axial images were obtained from the base of the skull through the vertex without intravenous contrast. COMPARISON:  Prior CT from 05/19/2021. FINDINGS: Brain: Continued interval evolution of cytotoxic edema involving the right frontoparietal region and right basal ganglia, consistent with right MCA distribution infarct. Overall size and extent is relatively stable. Localized edema with partial effacement of the right lateral ventricle and 5 mm of right-to-left shift, not significantly changed. No hydrocephalus or trapping. No evidence for new or interval hemorrhage. Underlying chronic microvascular ischemic disease with chronic left cerebellar and brainstem infarcts again noted. No other visible new area of infarction apparent vague parenchymal hypodensity overlying the high anterior frontal lobes near the vertex on the non corrected axial images favored to be artifactual due to patient positioning (series 3, image 24). No extra-axial collection. Vascular: No hyperdense vessel. Right MCA branch embolization coil again noted. Skull: No new or progressive finding. Sinuses/Orbits: Globes and orbital soft tissues demonstrate no new finding. Nasogastric tube in place with scattered mucosal thickening throughout the sphenoid ethmoidal sinuses. Other: None. IMPRESSION: 1. Continued interval evolution of right MCA distribution infarct, stable in size and extent from previous. Associated edema with 5 mm of right-to-left midline shift, not significantly changed. No hydrocephalus or trapping. No new intracranial hemorrhage. 2. No other new acute intracranial abnormality. Electronically Signed   By: Jeannine Boga M.D.   On: 05/21/2021 04:54   CT HEAD WO CONTRAST  Result Date: 05/19/2021 CLINICAL DATA:  Stroke follow-up EXAM: CT HEAD WITHOUT CONTRAST TECHNIQUE: Contiguous axial  images were obtained from the base of the skull through the vertex without intravenous contrast. COMPARISON:  Two days ago FINDINGS: Brain: Right basal ganglia and frontal parietal cortex infarcts which are in the MCA territory. Local mass effect without midline shift. Confluent chronic small vessel ischemia. Remote left cerebellar and brainstem infarcts. No acute hemorrhage. Vascular: Right MCA branch embolization coil. Skull: Normal. Negative for fracture or focal lesion. Sinuses/Orbits: Negative IMPRESSION: 1. Recent right MCA territory infarction without progressive finding or interval hemorrhage. 2. Chronic ischemic injury elsewhere in the brain. Electronically Signed   By: Monte Fantasia M.D.   On: 05/19/2021 11:11   CT Angio Chest Pulmonary Embolism (PE) W or WO Contrast  Result Date: 06/03/2021 CLINICAL DATA:  Shortness of breath EXAM: CT ANGIOGRAPHY CHEST WITH CONTRAST TECHNIQUE: Multidetector CT imaging of the chest was performed using the standard protocol during bolus administration of intravenous contrast. Multiplanar CT image reconstructions and MIPs were obtained to evaluate the vascular anatomy. CONTRAST:  73m OMNIPAQUE IOHEXOL 350 MG/ML SOLN COMPARISON:  January 11, 2021. FINDINGS: Cardiovascular: Satisfactory opacification of the pulmonary arteries to the segmental level. No evidence of pulmonary embolism. Cardiomegaly. Small pericardial effusion. Mediastinum/Nodes: Scattered thyroid nodules, the largest of which measures 12 mm. No mediastinal hilar adenopathy. No axillary adenopathy. Lungs/Pleura: Tracheostomy. LEFT basilar atelectasis. No  pleural effusion or pneumothorax. Upper Abdomen: Enteric tube tip terminates in the proximal duodenum. Musculoskeletal: No chest wall abnormality. No acute or significant osseous findings. Review of the MIP images confirms the above findings. IMPRESSION: 1. No evidence of pulmonary embolism. 2. Cardiomegaly. 3. Scattered thyroid nodules. Not clinically  significant; no follow-up imaging recommended (ref: J Am Coll Radiol. 2015 Feb;12(2): 143-50). Electronically Signed   By: Valentino Saxon MD   On: 06/03/2021 17:39   DG CHEST PORT 1 VIEW  Result Date: 06/02/2021 CLINICAL DATA:  Fever. EXAM: PORTABLE CHEST 1 VIEW COMPARISON:  05/30/2021 FINDINGS: Stable enlarged cardiac silhouette. Tracheostomy tube in satisfactory position. Feeding tube extending into the stomach. Dense left lower lobe airspace consolidation is again demonstrated. Mild patchy density at the medial right lung base. Unremarkable bones. IMPRESSION: 1. Stable dense left lower lobe atelectasis or pneumonia. 2. Interval mild patchy atelectasis or pneumonia at the medial right lung base. 3. Stable cardiomegaly. Electronically Signed   By: Claudie Revering M.D.   On: 06/02/2021 15:55   DG Chest Port 1 View  Result Date: 05/30/2021 CLINICAL DATA:  Fever. EXAM: PORTABLE CHEST 1 VIEW COMPARISON:  05/25/2021. FINDINGS: Tracheostomy tube and feeding tube in stable position. Cardiomegaly. No pulmonary venous congestion. Left lower lobe atelectasis/consolidation again noted. Small left pleural effusion. No pneumothorax. IMPRESSION: 1.  Tracheostomy tube and feeding tube in stable position. 2.  Cardiomegaly.  No pulmonary venous congestion. 3. Left lower lobe atelectasis/consolidation again noted. Small left pleural effusion. Electronically Signed   By: Marcello Moores  Register   On: 05/30/2021 09:14   DG Chest Port 1 View  Result Date: 05/25/2021 CLINICAL DATA:  Status post tracheostomy. EXAM: PORTABLE CHEST 1 VIEW COMPARISON:  05/21/2021 FINDINGS: Interval placement of tracheostomy tube with tip approximately 4 cm above the carina. No pneumothorax or pneumomediastinum. Right lung remains clear. Cardiopericardial silhouette is enlarged. Left base collapse/consolidation is similar to prior. A feeding tube passes into the stomach although the distal tip position is not included on the film. Telemetry leads overlie  the chest. IMPRESSION: Interval placement of tracheostomy tube with tip approximately 4 cm above the carina. Electronically Signed   By: Misty Stanley M.D.   On: 05/25/2021 10:33   DG CHEST PORT 1 VIEW  Result Date: 05/21/2021 CLINICAL DATA:  Fever, altered mental status EXAM: PORTABLE CHEST 1 VIEW COMPARISON:  05/17/2021 FINDINGS: Nasogastric tube coursing below the diaphragm projecting over the stomach with the tip excluded from the field of view. Endotracheal tube with the tip 6.6 cm above the carina. Left basilar airspace disease. Otherwise no focal parenchymal opacity. No pleural effusion or pneumothorax. Stable cardiomegaly. No acute osseous abnormality. IMPRESSION: 1. Nasogastric tube coursing below the diaphragm projecting over the stomach with the tip excluded from the field of view. 2. Endotracheal tube with the tip 6.6 cm above the carina. Recommend advancing the endotracheal tube 2 cm. 3. Left lower lobe airspace disease which may reflect atelectasis versus pneumonia. Electronically Signed   By: Kathreen Devoid   On: 05/21/2021 11:28   DG Abd Portable 1V  Result Date: 05/30/2021 CLINICAL DATA:  Ileus. EXAM: PORTABLE ABDOMEN - 1 VIEW COMPARISON:  05/26/2021. FINDINGS: Feeding tube noted with tip over the stomach. No bowel distention. No free air. Large abdominopelvic mass again noted. Reference is made to CT report of 05/17/2021. IMPRESSION: 1. Feeding tube noted with tip over the stomach. No bowel distention. 2. Large abdominopelvic mass again noted. Reference is made to CT report of 05/17/2021. Electronically Signed  By: [place]   On: 05/30/2021 12:46   DG Abd Portable 1V  Result Date: 05/26/2021 CLINICAL DATA:  Abdominal distension EXAM: PORTABLE ABDOMEN - 1 VIEW COMPARISON:  Portable exam 1908 hours compared to 05/16/2021 FINDINGS: Tip of feeding tube projects over distal gastric antrum. Nonobstructive bowel gas pattern. Osseous structures unremarkable. Atelectasis versus  consolidation LEFT lower lobe. IMPRESSION: Tip of feeding tube projects over distal gastric antrum. Atelectasis versus consolidation LEFT lower lobe. Electronically Signed   By: Lavonia Dana M.D.   On: 05/26/2021 20:12   DG Swallowing Func-Speech Pathology  Result Date: 06/12/2021 Formatting of this result is different from the original. Objective Swallowing Evaluation: Type of Study: MBS-Modified Barium Swallow Study  Patient Details Name: Holly Bradley MRN: [id number] Date of Birth: 1976-01-17 Today's Date: 06/12/2021 Time: SLP Start Time (ACUTE ONLY): 16 -SLP Stop Time (ACUTE ONLY): 1310 SLP Time Calculation (min) (ACUTE ONLY): 40 min Past Medical History: Past Medical History: Diagnosis Date  Hypertension  Past Surgical History: Past Surgical History: Procedure Laterality Date  IR ANGIOGRAM FOLLOW UP STUDY  05/15/2021  IR CT HEAD LTD  05/15/2021  IR PERCUTANEOUS ART THROMBECTOMY/INFUSION INTRACRANIAL INC DIAG ANGIO  05/15/2021     IR PERCUTANEOUS ART THROMBECTOMY/INFUSION INTRACRANIAL INC DIAG ANGIO  05/15/2021  IR TRANSCATH/EMBOLIZ  05/15/2021  RADIOLOGY WITH ANESTHESIA N/A 05/15/2021  Procedure: IR WITH ANESTHESIA;  Surgeon: Radiologist, Medication, MD;  Location: [facility];  Service: Radiology;  Laterality: N/A; HPI: Pt is a [name] who presented with AMS and slurred speech after being found down. MRI showing moderately large acute right MCA infarct with associated petechial hemorrhage, SAH, and minimal IVH. S/p mechanical thrombectomy of M1/M2/M3 and embolization of right MCA M3 branch. Significant PMH: uncontrolled HTN, tobacco use disorder, HF, paroxysmal SVT, paranoid psychosis. Required intubation s/p trach 6/3, changed to cuffless 6/16. Pt pending PEG however large uterine mass disrupting planned placement, and goals of care are unclear at this time  Subjective: Alert and participatory Assessment / Plan / Recommendation CHL IP CLINICAL IMPRESSIONS 06/12/2021 Clinical Impression Pt presented with a much more  successful swallow than anticipated -  her dysphagia is relatively mild.  PMV was in place for the study; she followed all commands; was unable to communicate verbally but used gestures/facial expressions. Pt demonstrated mild deficits with oral control, lip seal on left, and premature loss of thin liquids over base of tongue. Thin liquids reached the pyriform sinuses and occasionally a trace amount spilled into the larynx PRIOR TO swallow onset - there was no immediate cough response, but after 30 seconds a cough occurred. Pt protected her airway reliably with nectar thick liquids, purees, and a cracker - there was consistent laryngeal vestibule closure (no penetration/aspiration).  Also notable was strong pharyngeal squeeze with no residue remaining in pharynx after the swallows.  Recommend beginning an oral diet and allowing Ms. Bruni an opportunity to [place].  REC: dysphagia 2; nectar thick liquids; meds crushed in puree.  Pt will need to be sitting as upright as possible; fully supervise all PO intake and assist her with self-feeding as able.  Reduce TF per dietician.  Use PMV with PO intake.  SLP will follow for safety/diet progression. SLP Visit Diagnosis Dysphagia, oropharyngeal phase (R13.12) Attention and concentration deficit following -- Frontal lobe and executive function deficit following -- Impact on safety and function Mild aspiration risk   CHL IP TREATMENT RECOMMENDATION 06/12/2021 Treatment Recommendations Therapy as outlined in treatment plan below   Prognosis 06/12/2021 Prognosis for  Safe Diet Advancement Good Barriers to Reach Goals -- Barriers/Prognosis Comment -- CHL IP DIET RECOMMENDATION 06/12/2021 SLP Diet Recommendations Dysphagia 2 (Fine chop) solids;Nectar thick liquid Liquid Administration via Cup;Straw Medication Administration Crushed with puree Compensations Minimize environmental distractions Postural Changes Seated upright at 90 degrees   CHL IP OTHER RECOMMENDATIONS 06/12/2021  Recommended Consults -- Oral Care Recommendations Oral care BID Other Recommendations Order thickener from pharmacy   CHL IP FOLLOW UP RECOMMENDATIONS 06/08/2021 Follow up Recommendations Inpatient Rehab;Skilled Nursing facility   [facility] IP FREQUENCY AND DURATION 06/12/2021 Speech Therapy Frequency (ACUTE ONLY) min 3x week Treatment Duration 2 weeks      CHL IP ORAL PHASE 06/12/2021 Oral Phase Impaired Oral - Pudding Teaspoon -- Oral - Pudding Cup -- Oral - Honey Teaspoon -- Oral - Honey Cup -- Oral - Nectar Teaspoon -- Oral - Nectar Cup -- Oral - Nectar Straw -- Oral - Thin Teaspoon -- Oral - Thin Cup -- Oral - Thin Straw -- Oral - Puree -- Oral - Mech Soft -- Oral - Regular Piecemeal swallowing Oral - Multi-Consistency -- Oral - Pill -- Oral Phase - Comment --  CHL IP PHARYNGEAL PHASE 06/12/2021 Pharyngeal Phase Impaired Pharyngeal- Pudding Teaspoon -- Pharyngeal -- Pharyngeal- Pudding Cup -- Pharyngeal -- Pharyngeal- Honey Teaspoon -- Pharyngeal -- Pharyngeal- Honey Cup -- Pharyngeal -- Pharyngeal- Nectar Teaspoon -- Pharyngeal -- Pharyngeal- Nectar Cup Delayed swallow initiation-vallecula Pharyngeal -- Pharyngeal- Nectar Straw Delayed swallow initiation-vallecula Pharyngeal -- Pharyngeal- Thin Teaspoon -- Pharyngeal -- Pharyngeal- Thin Cup Delayed swallow initiation-pyriform sinuses Pharyngeal -- Pharyngeal- Thin Straw Delayed swallow initiation-pyriform sinuses;Penetration/Aspiration before swallow Pharyngeal Material enters airway, passes BELOW cords without attempt by patient to eject out (silent aspiration) Pharyngeal- Puree WFL Pharyngeal -- Pharyngeal- Mechanical Soft -- Pharyngeal -- Pharyngeal- Regular WFL Pharyngeal -- Pharyngeal- Multi-consistency -- Pharyngeal -- Pharyngeal- Pill -- Pharyngeal -- Pharyngeal Comment --  No flowsheet data found. [name] 06/12/2021, 2:23 PM              VAS [place] TRANSCRANIAL DOPPLER W BUBBLES  Result Date: 05/25/2021  Transcranial Doppler with Bubble Patient  Name:  Holly Bradley  Date of Exam:   05/18/2021 Medical Rec #: [id number]         Accession #:    [id number] Date of Birth: 1976-03-05         Patient Gender: F Patient Age:   64Y Exam Location:  [facility] Procedure:      VAS [place] TRANSCRANIAL Candise Bowens Referring Phys: [id number] [employer] --------------------------------------------------------------------------------  Indications: Stroke. Limitations: Patient intubated and unable to perform proper Valsalva maneuver. Comparison Study: No prior study Performing Technologist: Maudry Mayhew MHA, RDMS, RVT, RDCS  Examination Guidelines: A complete evaluation includes B-mode imaging, spectral Doppler, color Doppler, and power Doppler as needed of all accessible portions of each vessel. Bilateral testing is considered an integral part of a complete examination. Limited examinations for reoccurring indications may be performed as noted.  Summary: No HITS at rest or during manual Valsalva. Negative transcranial Doppler Bubble study with no evidence of right to left intracardiac communication.  A vascular evaluation was performed. The left middle cerebral artery was studied. An IV was inserted into the patient's right forearm.  *See table(s) above for TCD measurements and observations.  Diagnosing physician: Rosalin Hawking MD Electronically signed by Rosalin Hawking MD on 05/25/2021 at 2:08:19 PM.    Final     Antimicrobials:  Cefazolin 5/30 through 6/5      Subjective: INTERACTIVE, DID  have some pain overnight, otherwise NO ACUTE EVENTS OVENRIGHT  Objective: Vitals:   06/17/21 0500 06/17/21 0519 06/17/21 0920 06/17/21 1220  BP:  130/69  129/68  Pulse:   86 82  Resp:   16 16  Temp:      TempSrc:      SpO2:   97% 98%  Weight: 71.2 kg     Height:       No intake or output data in the 24 hours ending 06/17/21 1300 Filed Weights   06/15/21 0411 06/16/21 0500 06/17/21 0500  Weight: 73.3 kg 70 kg 71.2 kg     Examination: Constitutional: Alert and calm and in no acute distress Respiratory: 6.0 cuffless trach to humidified trach collar, no increased work of breathing.  Was spontaneous coughing once again expectorated sputum.  O2 saturations 99 to 100% Cardiovascular: S1-S2, no obvious peripheral edema or JVD, regular pulse.  Normotensive Abdomen:  LBM 6/22, abdomen soft and nontender.  On nocturnal tube feedings Neurologic: CN 2-12 grossly intact except for left facial droop when smiling.  Sensation intact on right side but diminished on the left side with patient reporting sharp sensation as dull.  DTR normal.  Strength 3+-4/5 on the right with purposeful movement.  Dense hemiplegia involving left side.  Strong forceful spontaneous cough. Psychiatric: [employer], able to communicate accurately via the written word.  Pleasant affect and smiles    Data Reviewed: I have personally reviewed following labs and imaging studies  CBC: Recent Labs  Lab 06/15/21 0642  WBC 4.6  NEUTROABS 3.5  HGB 9.0*  HCT 29.8*  MCV 82.8  PLT 179   Basic Metabolic Panel: Recent Labs  Lab 06/15/21 0642  NA 132*  K 4.1  CL 104  CO2 21*  GLUCOSE 116*  BUN 34*  CREATININE 1.14*  CALCIUM 9.5  MG 1.9  PHOS 4.2   GFR: Estimated Creatinine Clearance: 60.4 mL/min (A) (by C-G formula based on SCr of 1.14 mg/dL (H)). Liver Function Tests: Recent Labs  Lab 06/15/21 0642  AST 23  ALT 15  ALKPHOS 142*  BILITOT 0.4  PROT 6.8  ALBUMIN 2.7*   No results for input(s): LIPASE, AMYLASE in the last 168 hours. No results for input(s): AMMONIA in the last 168 hours. Coagulation Profile: No results for input(s): INR, PROTIME in the last 168 hours. Cardiac Enzymes: No results for input(s): CKTOTAL, CKMB, CKMBINDEX, TROPONINI in the last 168 hours. BNP (last 3 results) No results for input(s): PROBNP in the last 8760 hours. HbA1C: No results for input(s): HGBA1C in the last 72 hours. CBG: Recent Labs  Lab  06/16/21 2102 06/17/21 0030 06/17/21 0355 06/17/21 0750 06/17/21 1143  GLUCAP 114* 114* 121* 97 111*   Lipid Profile: No results for input(s): CHOL, HDL, LDLCALC, TRIG, CHOLHDL, LDLDIRECT in the last 72 hours. Thyroid Function Tests: No results for input(s): TSH, T4TOTAL, FREET4, T3FREE, THYROIDAB in the last 72 hours. Anemia Panel: No results for input(s): VITAMINB12, FOLATE, FERRITIN, TIBC, IRON, RETICCTPCT in the last 72 hours. Sepsis Labs: No results for input(s): PROCALCITON, LATICACIDVEN in the last 168 hours.  No results found for this or any previous visit (from the past 240 hour(s)).       Radiology Studies: No results found.      Scheduled Meds:  amiodarone  200 mg Per Tube Daily   amLODipine  10 mg Per Tube Daily   aspirin  81 mg Per Tube Daily   atorvastatin  40 mg Per Tube Daily  carvedilol  25 mg Per Tube BID WC   chlorhexidine gluconate (MEDLINE KIT)  15 mL Mouth Rinse BID   Chlorhexidine Gluconate Cloth  6 each Topical Daily   cloNIDine  0.1 mg Per Tube TID   feeding supplement (JEVITY 1.5 CAL/FIBER)  900 mL Per Tube Q24H   feeding supplement (PROSource TF)  45 mL Per Tube Daily   ferrous sulfate  300 mg Per Tube BID WC   free water  100 mL Per Tube Q6H   hydrALAZINE  100 mg Per Tube Q8H   isosorbide dinitrate  40 mg Per Tube TID   mouth rinse  15 mL Mouth Rinse 10 times per day   megestrol  80 mg Per Tube TID   multivitamin with minerals  1 tablet Per Tube Daily   pantoprazole sodium  40 mg Per Tube Daily   sacubitril-valsartan  1 tablet Per Tube BID   sodium chloride flush  10-40 mL Intracatheter Q12H   Continuous Infusions:   LOS: 33 days    Time spent: 35 min    Nicolette Bang, MD [facility]  If 7PM-7AM, please contact night-coverage  06/17/2021, 1:00 PM

## 2021-06-17 NOTE — Progress Notes (Signed)
Pt decannulated per MD order. Pt tolerated well, gauze applied over open stoma. Pt remains on room air, nurse made aware of changes.

## 2021-06-17 NOTE — Progress Notes (Signed)
[facility] night shift.  The nursing staff reported that the patient is having 10 out of 10 pain despite being administered tramadol.  Hydromorphone 1 mg and ondansetron 4 mg every 6 hours as needed were ordered.  Tennis Must, MD.

## 2021-06-17 NOTE — Progress Notes (Signed)
  Speech Language Pathology Treatment: Dysphagia;Cognitive-Linquistic  Patient Details Name: Holly Bradley MRN: [id number] DOB: 06/22/1976 Today's Date: 06/17/2021 Time: 4580-9983 SLP Time Calculation (min) (ACUTE ONLY): 21 min  Assessment / Plan / Recommendation    Clinical Impression  Pt requested ST return today due to increased coughing with meals. RN took care of her yesterday as well stating possible trach downsize to #4 as etiology due to increased secretions. Decannulated at 1345 today which can alter pressure for effective swallow until stoma closes. She was drowsy from pain medication but able to participate. Pt expressed via writing the solid/puree resulted in more coughing than liquids (even after trach downsize) and she suspected solids versus increase in secretions. Given her sleepiness, intake limited to approximately 5 bites yogurt and sips nectar juice via straw without immediate signs of possible compromised airway. There was one hard, wet cough several minutes after consumption of last sip that was questionable for possible intrusion of po or secretions post swallow although no appreciable residue seen on MBS. Pt can continue Dys 1, nectar thick and if increased signs seen with patent stoma, can modify as needed and ST will continue intervention.    HPI HPI: Pt is a [name] who presented with AMS and slurred speech after being found down. MRI showing moderately large acute right MCA infarct with associated petechial hemorrhage, SAH, and minimal IVH. S/p mechanical thrombectomy of M1/M2/M3 and embolization of right MCA M3 branch. Significant PMH: uncontrolled HTN, tobacco use disorder, HF, paroxysmal SVT, paranoid psychosis. Required intubation s/p trach 6/3, changed to cuffless 6/16. MBS 6/21 start dys1/nectar.  Pt pending PEG however large uterine mass disrupting planned placement, and goals of care are unclear at this time      SLP Plan  Continue with current plan of care        Recommendations  Diet recommendations: Dysphagia 1 (puree);Nectar-thick liquid Liquids provided via: Straw;Cup Medication Administration: Crushed with puree Supervision: Staff to assist with self feeding;Full supervision/cueing for compensatory strategies Compensations: Minimize environmental distractions Postural Changes and/or Swallow Maneuvers: Seated upright 90 degrees                Oral Care Recommendations: Oral care BID Follow up Recommendations: 24 hour supervision/assistance SLP Visit Diagnosis: Dysphagia, unspecified (R13.10);Cognitive communication deficit (J82.505) Plan: Continue with current plan of care       GO                [name] 06/17/2021, 4:10 PM  Holly Bradley.Ed Risk analyst [phone] Office [phone]

## 2021-06-17 NOTE — Progress Notes (Signed)
06/17/2021 Pulm Attending note  I have seen and evaluated the patient for trach wean  S:  No events, having fair bit of nonspecific pain. Capped yesterday.  O: Blood pressure 129/68, pulse 82, temperature 98.9 F (37.2 C), temperature source Oral, resp. rate 16, height 5\' 5"  (1.651 m), weight 71.2 kg, SpO2 98 %.  No distress Lungs clear Deconditioned L hemiplegia  A:  Right MCA stroke M1 occlusion post thrombectomy complicated by ruptured M3 endovascularly embolized- residual dense hemiplegia and deconditioning  Persistent respiratory failure due to above which seems to have improved significantly  P:  Decannulate to occlusive dressing, pressure apply with cough and talking If stoma does not close within 3 weeks would send to ENT to consider primary repair PCCM available PRN   Erskine Emery MD [facility] Prefer epic messenger for cross cover needs If after hours, please call E-link

## 2021-06-18 ENCOUNTER — Inpatient Hospital Stay (HOSPITAL_COMMUNITY): Payer: Medicaid Other

## 2021-06-18 DIAGNOSIS — I619 Nontraumatic intracerebral hemorrhage, unspecified: Secondary | ICD-10-CM

## 2021-06-18 DIAGNOSIS — I69254 Hemiplegia and hemiparesis following other nontraumatic intracranial hemorrhage affecting left non-dominant side: Secondary | ICD-10-CM

## 2021-06-18 DIAGNOSIS — G8190 Hemiplegia, unspecified affecting unspecified side: Secondary | ICD-10-CM

## 2021-06-18 LAB — GLUCOSE, CAPILLARY
Glucose-Capillary: 101 mg/dL — ABNORMAL HIGH (ref 70–99)
Glucose-Capillary: 106 mg/dL — ABNORMAL HIGH (ref 70–99)
Glucose-Capillary: 111 mg/dL — ABNORMAL HIGH (ref 70–99)
Glucose-Capillary: 119 mg/dL — ABNORMAL HIGH (ref 70–99)
Glucose-Capillary: 121 mg/dL — ABNORMAL HIGH (ref 70–99)
Glucose-Capillary: 96 mg/dL (ref 70–99)

## 2021-06-18 LAB — PHOSPHORUS: Phosphorus: 3.9 mg/dL (ref 2.5–4.6)

## 2021-06-18 LAB — BASIC METABOLIC PANEL
Anion gap: 8 (ref 5–15)
BUN: 29 mg/dL — ABNORMAL HIGH (ref 6–20)
CO2: 22 mmol/L (ref 22–32)
Calcium: 10 mg/dL (ref 8.9–10.3)
Chloride: 105 mmol/L (ref 98–111)
Creatinine, Ser: 0.93 mg/dL (ref 0.44–1.00)
GFR, Estimated: >60 mL/min (ref >60)
Glucose, Bld: 109 mg/dL — ABNORMAL HIGH (ref 70–99)
Potassium: 4.3 mmol/L (ref 3.5–5.1)
Sodium: 135 mmol/L (ref 135–145)

## 2021-06-18 LAB — MAGNESIUM: Magnesium: 1.8 mg/dL (ref 1.7–2.4)

## 2021-06-18 IMAGING — DX DG ABD PORTABLE 1V
1 series · 1 of 1 positions shown · non-contrast
Comparison: [DATE], CT from [DATE]

CLINICAL DATA: Abdominal distension

EXAM:
PORTABLE ABDOMEN - 1 VIEW

[abdomen]
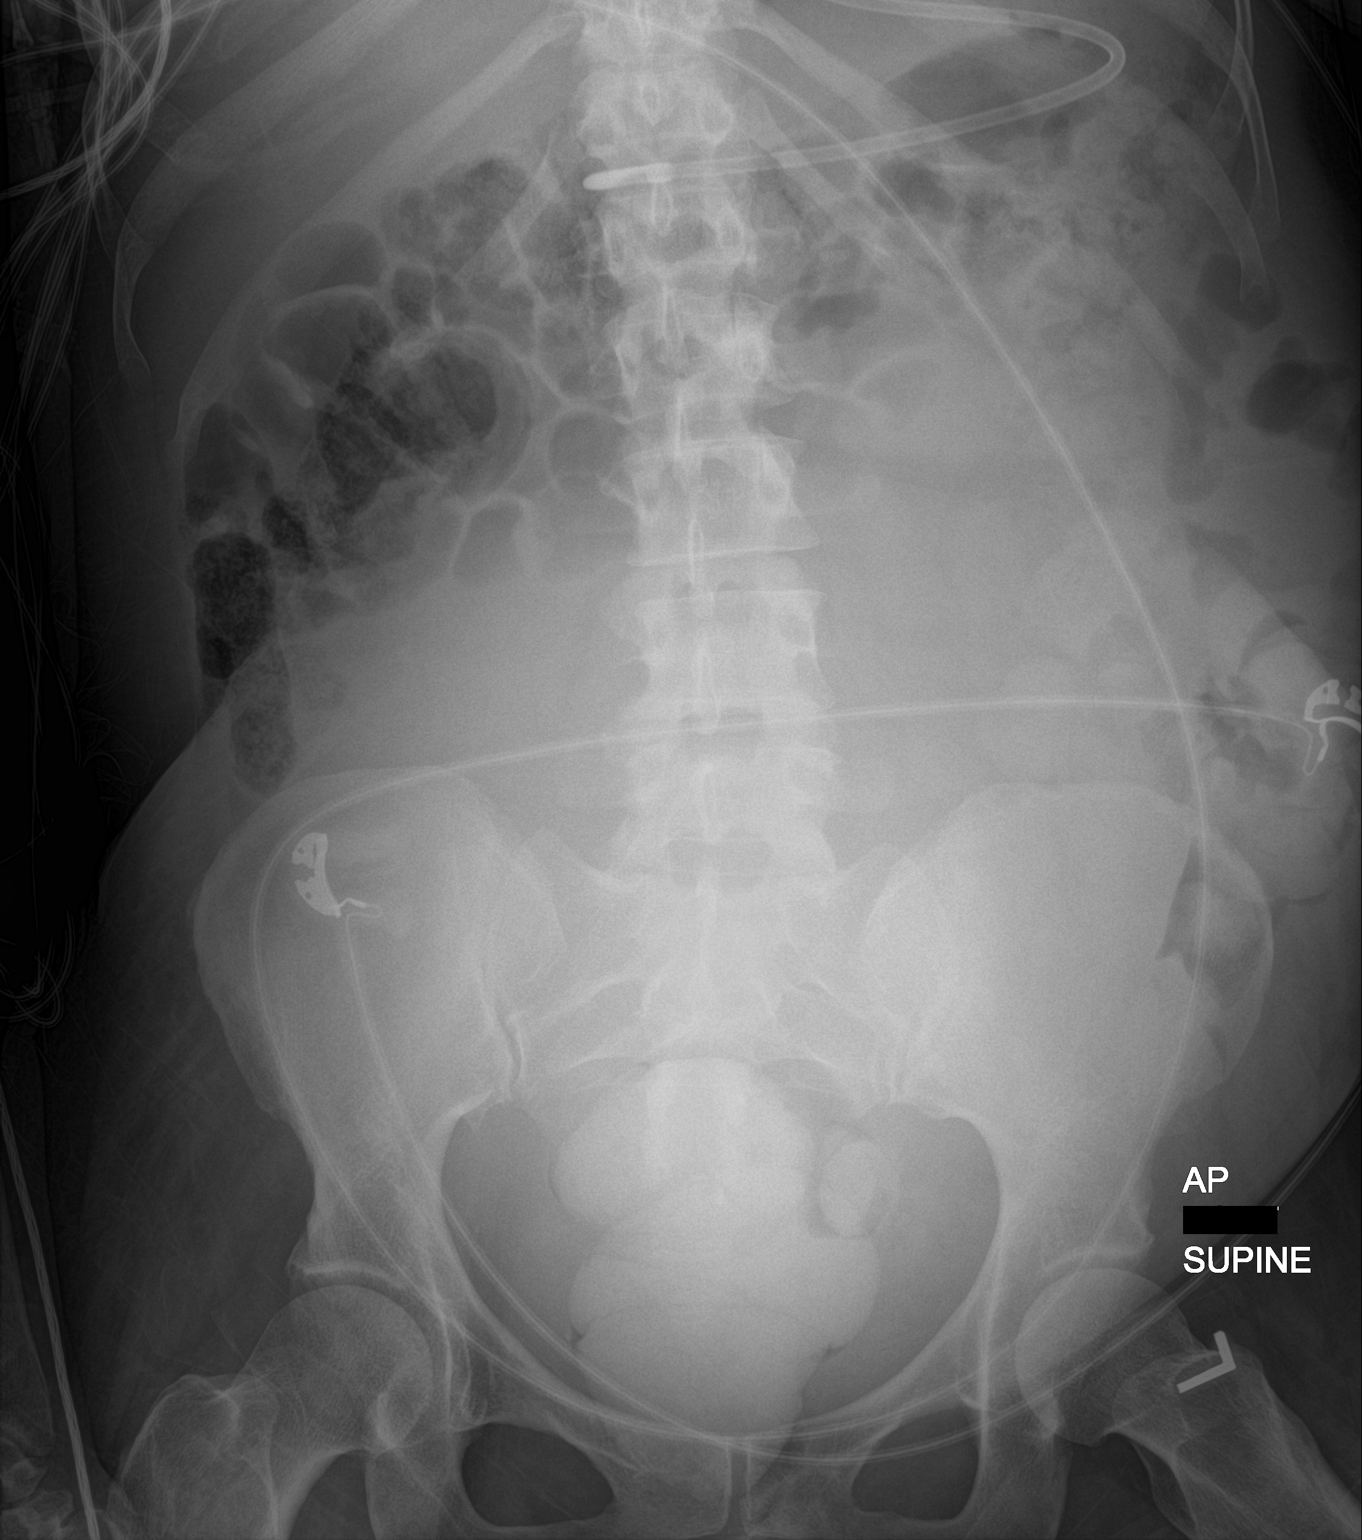

[1 of 1 positions shown; findings below may reference images not displayed]

FINDINGS: Feeding catheter is noted in the distal aspect of the stomach.
Scattered large and small bowel gas is noted. No obstructive changes
are noted. Large soft tissue density is noted in the pelvis similar
to that seen on prior CT examination consistent with uterine fibroid
change.
IMPRESSION: Stable soft tissue mass in the pelvis similar to that seen on prior
CT examination.

No acute abnormality noted.

## 2021-06-18 MED ORDER — OXYCODONE HCL 5 MG PO TABS
2.5000 mg | ORAL_TABLET | ORAL | Status: DC | PRN
  Administered 2021-06-18 – 2021-06-27 (×13): 2.5 mg via ORAL
  Filled 2021-06-18 (×15): qty 1

## 2021-06-18 MED ORDER — LACTULOSE 10 GM/15ML PO SOLN
10.0000 g | Freq: Two times a day (BID) | ORAL | Status: DC
  Administered 2021-06-18 – 2021-06-21 (×7): 10 g
  Filled 2021-06-18 (×7): qty 30

## 2021-06-18 MED ORDER — SODIUM CHLORIDE 0.9 % IV SOLN: INTRAVENOUS | Status: DC

## 2021-06-18 NOTE — Progress Notes (Signed)
Physical Therapy Treatment Patient Details Name: Holly Bradley MRN: [id number] DOB: Mar 20, 1976 Today's Date: 06/18/2021    History of Present Illness 45 yo female s/p mechanical thrombectomy on 5/24 of right MCA M1/M2/M3 and embolization of right MCA M3 branch due to contrast extravasation. Pt re-intubated 5/26. Repeat head CT on 5/26 showed increased cytotoxic edema and mildly increased leftward midline shift, now 5 mm, decreased SAH and IVH. Abdominal CT showing 22.9 cm mass, possibly consistent with uterine fibroids, no lymphadenopathy. s/p trach 6/3. PMH fibriods HTN HF paranoid psychosis recent hospitalizations january and february 2022    PT Comments    Pt received by PT/OT following bedside procedure and pt somewhat lethargic and depressed of mood. Pt less participatory throughout which limited progress. Pt did verbalize one sentence and a couple of yes/no throughout session. Max A +2 needed for bed mobility as well as lateral scoot transfer to recliner from bed. Pt with minimal use of R side for assist with scoot. Pt positioned in chair and assisted with lunch set up. PT will continue to follow.    Follow Up Recommendations  SNF     Equipment Recommendations  Wheelchair (measurements PT);Wheelchair cushion (measurements PT);Hospital bed;Other (comment) (hoyer lift)    Recommendations for Other Services Rehab consult     Precautions / Restrictions Precautions Precautions: Fall;Other (comment) Precaution Comments: L hemiparesis, PRAFO,  resting hand splint,  trach, SBP < 160, cortrak Required Braces or Orthoses: Other Brace Other Brace: L resting hand splint (4 hours on/4 hours off) Restrictions Weight Bearing Restrictions: No    Mobility  Bed Mobility Overal bed mobility: Needs Assistance Bed Mobility: Rolling Rolling: Max assist;+2 for physical assistance Sidelying to sit: Total assist;+2 for physical assistance       General bed mobility comments: max +2 for  rolling L with hand over hand guidance of R hand to rail and manual facilitation of RLE under L to assist it off bed. Max A +2 for elevation of trunk into sitting and pt had difficulty with wt shift from L to R    Transfers Overall transfer level: Needs assistance Equipment used: None Transfers: Lateral/Scoot Transfers          Lateral/Scoot Transfers: Max assist;+2 physical assistance General transfer comment: lateral scoot to R into drop arm recliner. Pt's R hand needed to be assisted to reach R after each scoot. Pad used beneath pt to move. Minimal assist from pt's RLE.  Ambulation/Gait                 Stairs             Wheelchair Mobility    Modified Rankin (Stroke Patients Only) Modified Rankin (Stroke Patients Only) Pre-Morbid Rankin Score: No symptoms Modified Rankin: Severe disability     Balance Overall balance assessment: Needs assistance Sitting-balance support: Single extremity supported;Feet supported Sitting balance-Leahy Scale: Poor Sitting balance - Comments: mod/ max posterior assist, preference for L lateral leaning. Able to lighten assist with pt in R lean onto R elbow but could not maintain for >1 min at a time due to discomfort Postural control: Left lateral lean                                  Cognition Arousal/Alertness: Lethargic Behavior During Therapy: Flat affect   Area of Impairment: Following commands;Attention;Safety/judgement;Problem solving  Current Attention Level: Sustained   Following Commands: Follows one step commands with increased time Safety/Judgement: Decreased awareness of safety Awareness: Emergent Problem Solving: Slow processing;Requires verbal cues;Requires tactile cues;Difficulty sequencing General Comments: pt having procedure at bedside upon PT/OT arrival and nodded that she was tired and feeling down. Depressed mood continued throughout session      Exercises  General Exercises - Lower Extremity Ankle Circles/Pumps: Right;10 reps;AROM;Supine Long Arc Quad: Right;Seated;AROM;5 reps Heel Slides: AROM;Right;5 reps    General Comments General comments (skin integrity, edema, etc.): VSS      Pertinent Vitals/Pain Pain Assessment: Faces Faces Pain Scale: Hurts little more Pain Location: LUE/ shoulder Pain Descriptors / Indicators: Grimacing;Discomfort;Sore Pain Intervention(s): Limited activity within patient's tolerance;Monitored during session;Repositioned    Home Living                      Prior Function            PT Goals (current goals can now be found in the care plan section) Acute Rehab PT Goals Patient Stated Goal: none stated PT Goal Formulation: With patient Time For Goal Achievement: 06/18/21 Potential to Achieve Goals: Fair Progress towards PT goals: Not progressing toward goals - comment (flat mood today)    Frequency    Min 3X/week      PT Plan Current plan remains appropriate    Co-evaluation PT/OT/SLP Co-Evaluation/Treatment: Yes Reason for Co-Treatment: Complexity of the patient's impairments (multi-system involvement);Necessary to address cognition/behavior during functional activity;For patient/therapist safety PT goals addressed during session: Mobility/safety with mobility;Balance;Strengthening/ROM        AM-PAC PT "6 Clicks" Mobility   Outcome Measure  Help needed turning from your back to your side while in a flat bed without using bedrails?: Total Help needed moving from lying on your back to sitting on the side of a flat bed without using bedrails?: Total Help needed moving to and from a bed to a chair (including a wheelchair)?: Total Help needed standing up from a chair using your arms (e.g., wheelchair or bedside chair)?: Total Help needed to walk in hospital room?: Total Help needed climbing 3-5 steps with a railing? : Total 6 Click Score: 6    End of Session   Activity  Tolerance: Other (comment) (pt limited by flat mood) Patient left: with call bell/phone within reach;in chair;with chair alarm set;Other (comment) (maximove pad under pt) Nurse Communication: Mobility status;Need for lift equipment PT Visit Diagnosis: Hemiplegia and hemiparesis;Other abnormalities of gait and mobility (R26.89);Unsteadiness on feet (R26.81);Muscle weakness (generalized) (M62.81);Difficulty in walking, not elsewhere classified (R26.2);Other symptoms and signs involving the nervous system (R29.898);Apraxia (R48.2) Hemiplegia - Right/Left: Left Hemiplegia - dominant/non-dominant: Non-dominant Hemiplegia - caused by: Cerebral infarction;Nontraumatic SAH;Nontraumatic intracerebral hemorrhage     Time: 1223-1250 PT Time Calculation (min) (ACUTE ONLY): 27 min  Charges:  $Therapeutic Activity: 8-22 mins                     [place]  Pager [phone] Office [place] 06/18/2021, 1:08 PM

## 2021-06-18 NOTE — Progress Notes (Signed)
[facility] PROGRESS NOTE  Holly Bradley TJQ:[id number] DOB: 1976-06-24 DOA: 05/15/2021 PCP: Mitzi Hansen, MD  Status: Remains inpatient appropriate because:Altered mental status, Unsafe d/c plan, and Inpatient level of care appropriate due to severity of illness  Dispo: The patient is from: Home              Anticipated d/c is to: Home with brother with home health              Patient currently is not medically stable to d/c.   Difficult to place patient Yes   Level of care: Med-Surg  Code Status: Full code except for no CPR Family Communication: Patient only DVT prophylaxis: SCDs with history of hemorrhagic stroke COVID vaccination status: Unknown    HPI: 45 y.o. female past medical history of essential hypertension, diabetes mellitus type 2 large uterine fibroids paroxysmal SVT, chronic diastolic heart failure paranoid psychosis comes into the ED on 05/16/2019 with confusion was found to have a right MCA occlusion underwent thrombectomy which was complicated by contrast extravasation and M3 coiling embolization and revascularization.  She subsequently developed subarachnoid hemorrhage repeat imaging revealed right basal ganglia infarct with a 3 mm left shift, she was treated with hypertonic saline in the ICU, on 05/17/2021 she developed respiratory distress and he was intubated CT of the head showed increased cytotoxic edema and left side shift.  No mental status improvement is now trach and with nasal collar track for tube feedings.  She has been treated with Ancef and completed a 7-day course for MSSA pneumonia, her blood cultures on 05/21/2021 were positive for staph epi.  Infectious disease was consulted and are on board she has remained off antibiotics since 2022 repeated blood cultures on 06/02/2021 have remained negative.  5/24 Admit to neurology after ED visit with AMS, found to have R MCA stroke, underwent mechanical thrombectomy, M3 coil embolization, subsequent  SAH 5/26 decreased mental status and respiratory distress, intubated 6/1 still not candidate for extubation with poor mentation and cough mechanics. Family unsure if pt would want trach. Staph epi bacteremia, continues on ancef 6/2 moving RUE RLE following some commands. decision to move forward with tracheostomy.   6/3 perc trach. Slight WBC increase but no fever, continues on ancef 6/4 Tolerating PSV/CPAP 6/5 WBC up to 17, last day of ancef for staph pna. Trying ATC  6/9 ID consulted for staph epi bacteremia . Felt contamination and stopped abx. 6/12 had fever spikes but nothing to explain  6/13 change to cuffless trach  6/24 downsized to 4.0 cuffless trach and capping trials initiate /26 decannulated successfully  Subjective: Alert.  Indicates by nodding and can communicate by written word.  Objective: Vitals:   06/18/21 0606 06/18/21 0734  BP: (!) 149/78 (!) (P) 149/72  Pulse:  (P) 86  Resp:  (P) 13  Temp:  (P) 98.6 F (37 C)  SpO2:     No intake or output data in the 24 hours ending 06/18/21 0812  Filed Weights   06/15/21 0411 06/16/21 0500 06/17/21 0500  Weight: 73.3 kg 70 kg 71.2 kg    Exam:  Constitutional: Awake with more flat/saddened affect today. Respiratory: Lungs remain clear.  Previous tracheostomy site covered by dressing.  Stable on room air.  Documented with intermittent forceful coughing at times after eating dysphagia diet Cardiovascular: Normal heart sounds, normotensive, no peripheral edema. Abdomen:  LBM 6/26, abdomen with palpable large masslike area secondary to known large uterine fibroids.  Patient reporting pain with palpation without guarding  or rebounding.  Nodded yes when asked if she felt like she was constipated.  Normoactive bowel sounds Neurologic: CN 2-12 grossly intact except for left facial droop when smiling.  Sensation intact on right side but diminished on the left side with patient reporting sharp sensation as dull.  DTR normal.   Strength 3+-4/5 on the right with purposeful movement.  Dense hemiplegia involving left side.  Strong forceful spontaneous cough.  Appears to have right gaze preference and I am suspicious of a hemianopsia Psychiatric: Awake but saddened today.  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 Continue aspirin and Lipitor. Given significant hemiplegia will require aggressive PT/OT to determine if will eventually be able to ambulate but likely will be wheelchair-bound; family request to discharge patient home with brother.  Patient agrees. Order written for out of bed to chair 3 times daily  Cytotoxic cerebral edema/hypernatremia Initially treated with 3% saline subsequently switched to free water Patient with initial hypernatremia with improvement in sodium-neurological team recommended slow correction  Sodium normalized with most recent reading 137 on 6/15 Continue lower volume free water per tube  Pain syndrome Continue low dose Oxy IR 2.5 mg prn History of chronic constipation in context of known uterine fibroids Begin Chronulac 10 g BID IV fluid hydration as above  Hypertension/grade 2 diastolic dysfunction Echocardiogram this admission reveals preserved LVEF with grade 2 diastolic dysfunction Continue carvedilol, hydralazine, Isordil, Entresto, Catapres and Norvasc Currently euvolemic  Dysphagia due to recent stroke Continues to have some issues with light coughing with swallowing despite decannulation Current diet is D1 with nectar thick liquid Oral intake of fluids is low and likely secondary to consistency of liquids therefore have initiated  normal saline IV fluids Continue nocturnal tube feedings until oral intake improves and calorie count yielded about a 30% intake of orals over 3 days   Acute respiratory failure with hypoxia status post tracheostomy/MSSA pneumonia Tolerated capping trials and was decannulated on 6/26 Completed 7 days of cefazolin  Fever of unknown source/resolved: Blood cultures on 5/30 with staph epidermis to have hominis-both likely contaminants Repeat blood cultures on 6/6 with staph epidermis Repeat blood cultures on 6/11 negative No fever for the past 9 days  Thrombocytosis: Acting as an acute phase reactant with platelets trending downward 531,000 as of 6/16 and as of 6/24 platelets have normalized to 207,000   Acute blood loss anemia secondary to large uterine fibroids: Extremely large -palpable general abdominal exam OB/GYN was consulted they recommended to continue Megace. OB/GYN reevaluated.  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   Hemoglobin as above  Protein calorie malnutrition Body mass index is 26.12 kg/m.  Continue dysphagia 1 diet with nectar thick liquids Continue nocturnal tube feedings with Jevity and Prosource per tube  History of paroxysmal SVT: Continue home amiodarone dose.   Preop evaluation: Cardiology was consulted and deemed her intermediate risk for cardiopulmonary complications, they recommended no further cardiac work-up at this time. Recommended to  monitor postop and intraoperative fluid volume. Monitor on telemetry for signs of arrhythmia.   Goals of care: She has a very poor prognosis the brother has unrealistic expectations about outcomes. Will ask palliative care to  continue to work with them as the patient quality of life will be poor as she will be bedbound      Data Reviewed: Basic Metabolic Panel: Recent Labs  Lab 06/15/21 0642  NA 132*  K 4.1  CL 104  CO2 21*  GLUCOSE 116*  BUN 34*  CREATININE 1.14*  CALCIUM 9.5  MG 1.9  PHOS 4.2   Liver Function Tests: Recent Labs  Lab 06/15/21 0642  AST 23  ALT 15  ALKPHOS 142*  BILITOT 0.4  PROT 6.8  ALBUMIN 2.7*   No results for input(s): LIPASE, AMYLASE in the last 168 hours. No results for input(s): AMMONIA in the last 168 hours. CBC: Recent Labs  Lab 06/15/21 0642  WBC 4.6  NEUTROABS 3.5  HGB 9.0*  HCT 29.8*  MCV 82.8  PLT 207    Cardiac Enzymes: No results for input(s): CKTOTAL, CKMB, CKMBINDEX, TROPONINI in the last 168 hours. BNP (last 3 results) No results for input(s): BNP in the last 8760 hours.  ProBNP (last 3 results) No results for input(s): PROBNP in the last 8760 hours.  CBG: Recent Labs  Lab 06/17/21 1629 06/17/21 2000 06/18/21 0023 06/18/21 0430 06/18/21 0733  GLUCAP 87 122* 111* 106* 119*    No results found for this or any previous visit (from the past 240 hour(s)).   Studies: No results found.  Scheduled Meds:  amiodarone  200 mg Per Tube Daily   amLODipine  10 mg Per Tube Daily   aspirin  81 mg Per Tube Daily   atorvastatin  40 mg Per Tube Daily   carvedilol  25 mg Per Tube BID WC   chlorhexidine gluconate (MEDLINE KIT)  15 mL Mouth Rinse BID   Chlorhexidine Gluconate Cloth  6 each Topical Daily   cloNIDine  0.1 mg Per Tube TID   feeding supplement (JEVITY 1.5 CAL/FIBER)  900 mL Per Tube Q24H   feeding supplement (PROSource TF)  45 mL Per Tube Daily   ferrous sulfate  300 mg Per Tube BID WC   free water  100 mL Per Tube Q6H   hydrALAZINE  100 mg Per Tube Q8H   isosorbide dinitrate  40 mg Per Tube TID   mouth rinse  15 mL Mouth Rinse 10 times per day   megestrol  80 mg Per Tube TID   multivitamin with minerals  1 tablet Per Tube  Daily   pantoprazole sodium  40 mg Per Tube Daily   sacubitril-valsartan  1 tablet Per Tube BID   sodium chloride flush  10-40 mL Intracatheter Q12H   Continuous Infusions:  sodium chloride      Principal Problem:   Cerebrovascular accident (CVA) ([place]) Active Problems:   Acute respiratory failure with hypoxia (HCC)   Fibroid   Iron deficiency anemia due to chronic blood loss   Cerebral edema (HCC)   Chronic HFrEF (heart failure with reduced ejection fraction) (HCC)   Pneumonia due to methicillin susceptible Staphylococcus aureus (HCC)   Abdominal distention   Encounter for feeding tube placement   Encounter for intubation   Ileus ([place])   Staphylococcus epidermidis bacteremia   Status post tracheostomy ([place])   Abnormal uterine bleeding (AUB)   Consultants: PCCM Palliative medicine OB-GYN CIR General surgery Infectious disease  Procedures: Echocardiogram  IR transcatheter embolization on 5/24 EEG IR placement of tracheostomy on 6/3  Antibiotics: Cefazolin 5/30 through 6/5   Time spent: 45 minutes    Erin Hearing ANP  [facility] 7 am - 330 pm/M-F for direct patient care and secure chat Please refer to Amion for contact info 34  days

## 2021-06-18 NOTE — Progress Notes (Addendum)
This chaplain is responding to the consult for assistance creating or updating the Pt. Advance Directive.  The chaplain understands from reading the Pt. chart notes the Pt. brother-[name] is not available to accompany the Pt. until after 5pm.  The chaplain phoned the Pt. RN-Nate with an update to the consult.  The chaplain shared with the RN:  [place] is not able to complete an AD after 4pm because of a lack of notary and volunteers as witnesses.  The chaplain offered the "possibility" of the on-call chaplain completing the AD education tonight, recognizing emergent hospital needs will be triaged before AD education after 5pm.  If education can be completed when [name] is present, there is a possibility a notary can offer their services to the Pt. without [name] on Tuesday.   The chaplain understands the Pt. RN-Nate will page spiritual care if/when the Pt. brother-[name] arrives.  This chaplain will update the on-call chaplain.

## 2021-06-18 NOTE — Progress Notes (Signed)
Occupational Therapy Treatment Patient Details Name: Holly Bradley MRN: [id number] DOB: 30-Aug-1976 Today's Date: 06/18/2021    History of present illness 45 yo female s/p mechanical thrombectomy on 5/24 of right MCA M1/M2/M3 and embolization of right MCA M3 branch due to contrast extravasation. Pt re-intubated 5/26. Repeat head CT on 5/26 showed increased cytotoxic edema and mildly increased leftward midline shift, now 5 mm, decreased SAH and IVH. Abdominal CT showing 22.9 cm mass, possibly consistent with uterine fibroids, no lymphadenopathy. s/p trach 6/3. PMH fibriods HTN HF paranoid psychosis recent hospitalizations january and february 2022   OT comments  Pt seen in conjunction with PT to maximize pts activity tolerance. Pt more lethargic and flat this session. Pt was able to laterally scoot to recliner this session however pt making minimal efforts to assist with transfer needing MAX A +2 to laterally scoot to pts R side. Pt continues to present with impaired balance with pt needing MOD - MAX A for static sitting balance and impaired activity tolerance with pt noted to fall asleep during self feeding. Pt making minimal efforts to communicate with therapists even though pt has been writing to communicate. Pt did verbalize "my arm" when asking about pain. Pt would continue to benefit from skilled occupational therapy while admitted and after d/c to address the below listed limitations in order to improve overall functional mobility and facilitate independence with BADL participation. DC plan remains appropriate, will follow acutely per POC.    Follow Up Recommendations  SNF    Equipment Recommendations  Wheelchair (measurements OT);Wheelchair cushion (measurements OT);Hospital bed;3 in 1 bedside commode    Recommendations for Other Services      Precautions / Restrictions Precautions Precautions: Fall;Other (comment) Precaution Comments: L hemiparesis, PRAFO,  resting hand splint,   trach, SBP < 160, cortrak Required Braces or Orthoses: Other Brace Other Brace: L resting hand splint (4 hours on/4 hours off) Restrictions Weight Bearing Restrictions: No       Mobility Bed Mobility Overal bed mobility: Needs Assistance Bed Mobility: Rolling;Sidelying to Sit Rolling: Max assist;+2 for physical assistance Sidelying to sit: Total assist;+2 for physical assistance       General bed mobility comments: max +2 for rolling L with hand over hand guidance of R hand to rail and manual facilitation of RLE under L to assist it off bed. Max A +2 for elevation of trunk into sitting and pt had difficulty with wt shift from L to R    Transfers Overall transfer level: Needs assistance Equipment used: None Transfers: Lateral/Scoot Transfers          Lateral/Scoot Transfers: Max assist;+2 physical assistance General transfer comment: lateral scoot to R into drop arm recliner. Pt's R hand needed to be assisted to reach R after each scoot. Pad used beneath pt to move. Minimal assist from pt's RLE.    Balance Overall balance assessment: Needs assistance Sitting-balance support: Single extremity supported;Feet supported Sitting balance-Leahy Scale: Poor Sitting balance - Comments: mod/ max posterior assist, preference for L lateral leaning. Able to lighten assist with pt in R lean onto R elbow but could not maintain for >1 min at a time due to discomfort Postural control: Left lateral lean                                 ADL either performed or assessed with clinical judgement   ADL Overall ADL's : Needs assistance/impaired  Toilet Transfer: Maximal assistance;+2 for physical assistance Toilet Transfer Details (indicate cue type and reason): simualted via lateral scoot transfer from EOB>recliner, pt assisting minimally with transfer         Functional mobility during ADLs: Maximal assistance;+2 for physical assistance  (lateral scoot transfer fron EOB>recliner to pts R side) General ADL Comments: pt more flat this session seeming to be in a depressive mood.     Vision       Perception     Praxis      Cognition Arousal/Alertness: Awake/alert;Lethargic (moments of lethargy and moments of wakefulness) Behavior During Therapy: Flat affect Overall Cognitive Status: Difficult to assess Area of Impairment: Following commands;Attention;Safety/judgement;Problem solving;Awareness;Memory                   Current Attention Level: Sustained Memory: Decreased short-term memory (repeated cues) Following Commands: Follows one step commands with increased time Safety/Judgement: Decreased awareness of safety Awareness: Emergent Problem Solving: Slow processing;Requires verbal cues;Requires tactile cues;Difficulty sequencing;Decreased initiation General Comments: pt more flat this session, communicating minimally with therapists but did state "my arm" when asking about pain. pt usually more expressive and at least writing needs to therapists. pt also more lethagic needing increased time to follow commands. concerned pt is starting to get depressed        Exercises    Shoulder Instructions       General Comments VSS on RA, k tape still in place on pts LUE    Pertinent Vitals/ Pain       Pain Assessment: Faces Faces Pain Scale: Hurts little more Pain Location: LUE/ shoulder Pain Descriptors / Indicators: Grimacing;Discomfort;Sore Pain Intervention(s): Limited activity within patient's tolerance;Monitored during session;Repositioned  Home Living                                          Prior Functioning/Environment              Frequency  Min 2X/week        Progress Toward Goals  OT Goals(current goals can now be found in the care plan section)  Progress towards OT goals: Progressing toward goals  Acute Rehab OT Goals Patient Stated Goal: none stated Time For  Goal Achievement: 06/25/21 Potential to Achieve Goals: [place] Discharge plan remains appropriate;Frequency remains appropriate    Co-evaluation      Reason for Co-Treatment: Complexity of the patient's impairments (multi-system involvement);For patient/therapist safety;Necessary to address cognition/behavior during functional activity PT goals addressed during session: Mobility/safety with mobility;Balance;Strengthening/ROM OT goals addressed during session: ADL's and self-care      AM-PAC OT "6 Clicks" Daily Activity     Outcome Measure   Help from another person eating meals?: A Little (supervision) Help from another person taking care of personal grooming?: A Little Help from another person toileting, which includes using toliet, bedpan, or urinal?: A Lot Help from another person bathing (including washing, rinsing, drying)?: A Lot Help from another person to put on and taking off regular upper body clothing?: A Lot Help from another person to put on and taking off regular lower body clothing?: Total 6 Click Score: 13    End of Session    OT Visit Diagnosis: Unsteadiness on feet (R26.81);Muscle weakness (generalized) (M62.81);Hemiplegia and hemiparesis Hemiplegia - Right/Left: Left Hemiplegia - dominant/non-dominant: Non-Dominant Hemiplegia - caused by: Cerebral infarction   Activity Tolerance Patient limited by lethargy;Other (comment) (  depressive mood)   Patient Left in chair;with call bell/phone within reach;with chair alarm set   Nurse Communication Mobility status;Need for lift equipment        Time: 1222-1301 OT Time Calculation (min): 39 min  Charges: OT Treatments $Self Care/Home Management : 23-37 mins  [name], COTA/L Acute Rehabilitation Services [place] 06/18/2021, 1:29 PM

## 2021-06-18 NOTE — Progress Notes (Signed)
  Speech Language Pathology Treatment: Dysphagia;Cognitive-Linquistic  Patient Details Name: Holly Bradley MRN: [id number] DOB: 1976/05/06 Today's Date: 06/18/2021 Time: 6387-5643 SLP Time Calculation (min) (ACUTE ONLY): 31 min  Assessment / Plan / Recommendation Clinical Impression  Pt seen for dysphagia tx focusing on PO intake after recent decannulation with mild oral prep/holding prior to swallow which was slightly delayed with initiation, but no overt s/s of aspiration noted during session with cup sips of nectar-thickened liquids, thin via tsp and tsp (1/4) of puree given during session.  Cannot r/o silent aspiration, but MBS expected by end of this week, possibly sooner pending pt progression with PO intake.  Min oral intake d/t pt satiety, but consumed during trial/snack, so pt may not be hungry since close to lunch meal. Cognitive/speech/linguistic tx focusing on initiating voice with automatic tasks such as counting, maintaining voice with 1-2 word answers with pt only able to whisper to maintain phonation with these tasks 25% of time.  Pt was fatigued as it was later in afternoon when session completed.  Pt writing to communicate and/or using gestures or head nod/shake for yes/no.  Yes/no accuracy 90%; writing answers to questions re: personal info/preferences with food choices with pt able to communicate in full sentences given min verbal cues.  Continued ST recommended for completion of objective study to progress diet as pt able and diet tolerance/education as well as speech/language/cog tx.    HPI HPI: 45 yo female s/p mechanical thrombectomy on 5/24 of right MCA M1/M2/M3 and embolization of right MCA M3 branch due to contrast extravasation. Pt re-intubated 5/26. Repeat head CT on 5/26 showed increased cytotoxic edema and mildly increased leftward midline shift, now 5 mm, decreased SAH and IVH. Abdominal CT showing 22.9 cm mass, possibly consistent with uterine fibroids, no  lymphadenopathy. s/p trach 6/3. PMH fibriods HTN HF paranoid psychosis recent hospitalizations january and february 2022; Pt de-cannulated on 06/17/21 with diet recommendation being Dysphagia 1(puree) and thin liquids.        SLP Plan  Continue with current plan of care;MBS       Recommendations  Diet recommendations: Dysphagia 1 (puree);Nectar-thick liquid Liquids provided via: Teaspoon;Cup Medication Administration: Via alternative means Supervision: Patient able to self feed;Staff to assist with self feeding;Full supervision/cueing for compensatory strategies Compensations: Slow rate;Small sips/bites;Monitor for anterior loss Postural Changes and/or Swallow Maneuvers: Seated upright 90 degrees                Oral Care Recommendations: Oral care BID Follow up Recommendations: Other (comment) (TBD) SLP Visit Diagnosis: Dysphagia, oropharyngeal phase (R13.12);Aphonia (R49.1);Cognitive communication deficit (R41.841) Plan: Continue with current plan of care;MBS                       Elvina Sidle, M.S., CCC-SLP 06/18/2021, 4:28 PM

## 2021-06-18 NOTE — TOC Progression Note (Signed)
Transition of Care [facility]) - Progression Note    Patient Details  Name: Holly Bradley MRN: [id number] Date of Birth: Oct 15, 1976  Transition of [facility]) CM/SW [place], LCSW Phone Number: 06/18/2021, 9:42 AM  Clinical Narrative:    CSW spoke with patient's brother [name] to discuss discharge planning. [name] states he has been unable to get the patient's Medicaid switched to [place] from [place]. [name] states he is interested in becoming his sister's HCPOA so that he can assist with the Medicaid application - Spiritual Care consult will be placed for advanced directives. [name] reports he is able to care for his sister at home, that he is not interested in long term care at a facility. [name] reports he will only need assistance with bathing his sister. [name] was informed that his sister was decannulated yesterday but was not medically cleared for discharge yet. CSW also informed [name] of his sister's physical limitations that would likely cause her to have issues with mobility so she would be total care for all ADL's - [name] stated understanding but was adamant he could care for her at home.   CSW will continue following for discharge planning.  Expected Discharge Plan: [place] Barriers to Discharge: Continued Medical Work up  Expected Discharge Plan and Services Expected Discharge Plan: [place] In-house Referral: Chaplain Discharge Planning Services: CM Consult   Living arrangements for the past 2 months: Single Family Home                                       Social Determinants of Health (SDOH) Interventions    Readmission Risk Interventions No flowsheet data found.

## 2021-06-19 ENCOUNTER — Inpatient Hospital Stay (HOSPITAL_COMMUNITY): Payer: Medicaid Other

## 2021-06-19 DIAGNOSIS — K5909 Other constipation: Secondary | ICD-10-CM

## 2021-06-19 DIAGNOSIS — D259 Leiomyoma of uterus, unspecified: Secondary | ICD-10-CM

## 2021-06-19 DIAGNOSIS — G8194 Hemiplegia, unspecified affecting left nondominant side: Secondary | ICD-10-CM

## 2021-06-19 DIAGNOSIS — K5900 Constipation, unspecified: Secondary | ICD-10-CM

## 2021-06-19 DIAGNOSIS — D250 Submucous leiomyoma of uterus: Secondary | ICD-10-CM

## 2021-06-19 LAB — GLUCOSE, CAPILLARY
Glucose-Capillary: 114 mg/dL — ABNORMAL HIGH (ref 70–99)
Glucose-Capillary: 127 mg/dL — ABNORMAL HIGH (ref 70–99)
Glucose-Capillary: 113 mg/dL — ABNORMAL HIGH (ref 70–99)
Glucose-Capillary: 110 mg/dL — ABNORMAL HIGH (ref 70–99)
Glucose-Capillary: 101 mg/dL — ABNORMAL HIGH (ref 70–99)
Glucose-Capillary: 112 mg/dL — ABNORMAL HIGH (ref 70–99)
Glucose-Capillary: 85 mg/dL (ref 70–99)

## 2021-06-19 IMAGING — CT CT ABD-PELV W/O CM
2 of 4 series · 14 of 46 positions shown, 16 images · non-contrast
Comparison: CT [UT], CT chest [DATE]

CLINICAL DATA: Severe left lower quadrant pain with loose stools,
known massive fibroids with active menses.

EXAM:
CT ABDOMEN AND PELVIS WITHOUT CONTRAST
TECHNIQUE: Multidetector CT imaging of the abdomen and pelvis was performed
following the standard protocol without IV contrast.

[Series 3: ap without · axial · non-contrast · 0.68mm/px · z∈[-519,-79]mm · 11 of 98 slices shown, 13 images]
[im 5/98  soft-tissue]
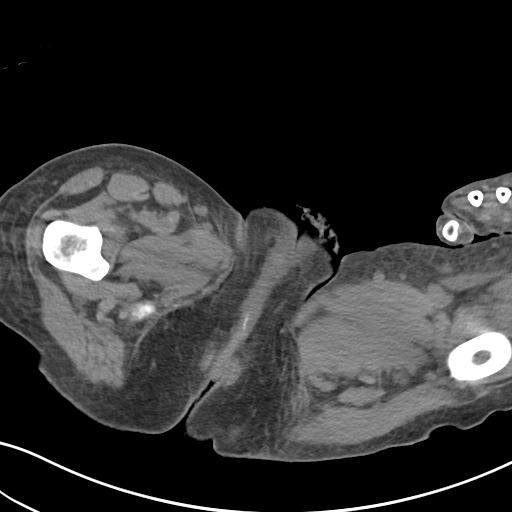
[im 5/98  bone]
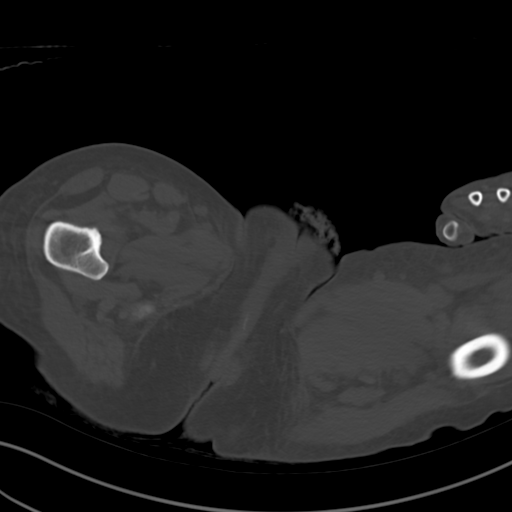
[im 15/98  soft-tissue]
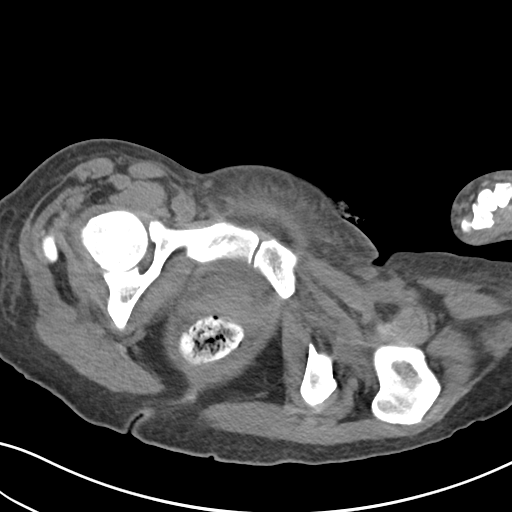
[im 25/98  soft-tissue]
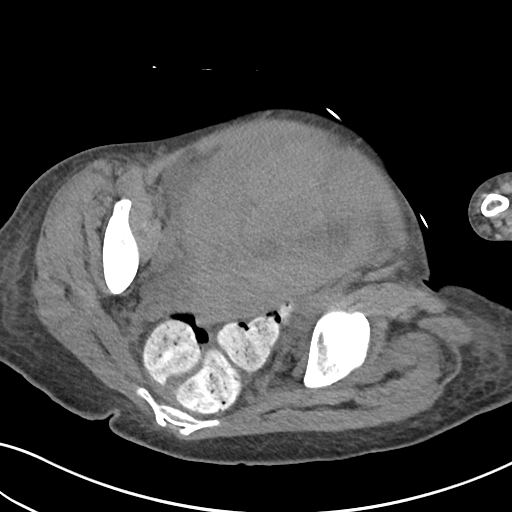
[im 34/98  soft-tissue]
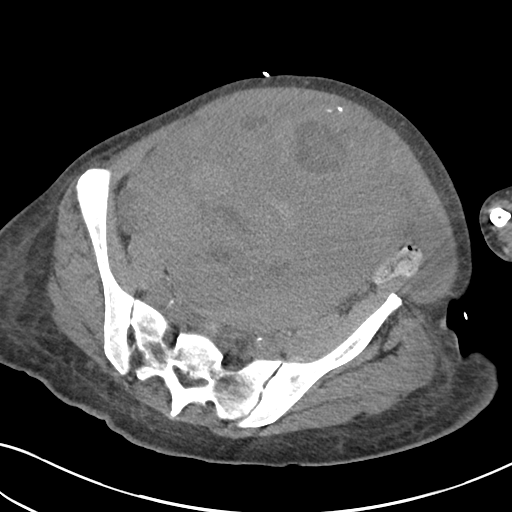
[im 39/98  soft-tissue]
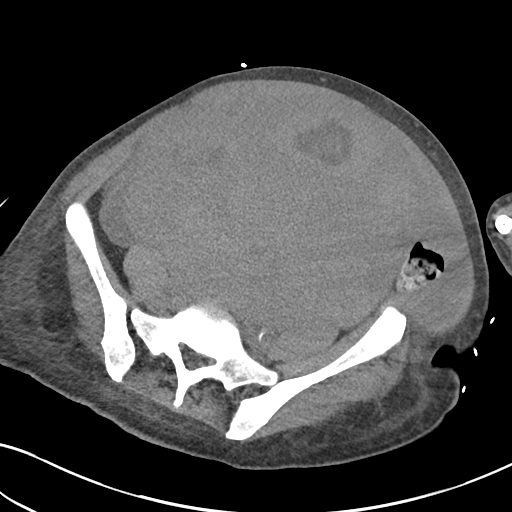
[im 49/98  soft-tissue]
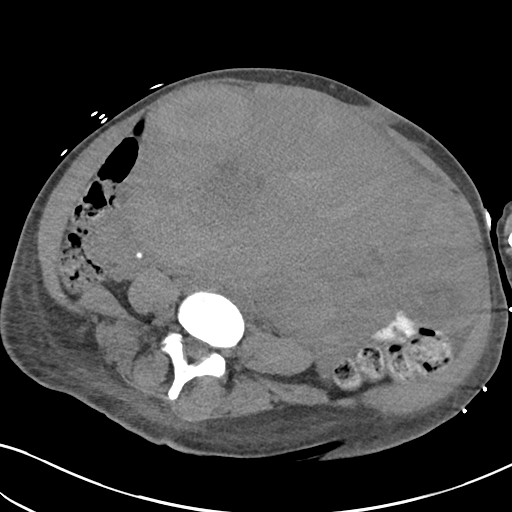
[im 59/98  soft-tissue]
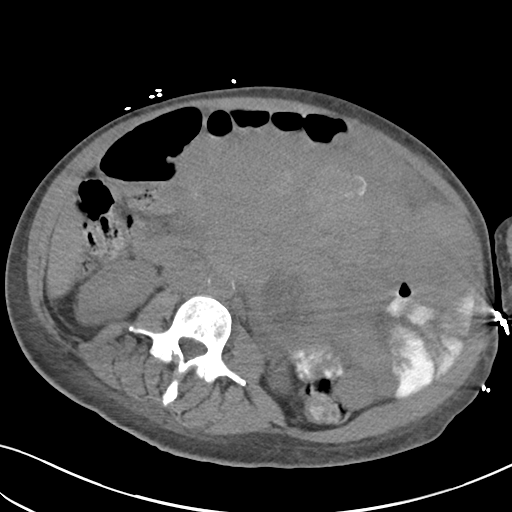
[im 64/98  soft-tissue]
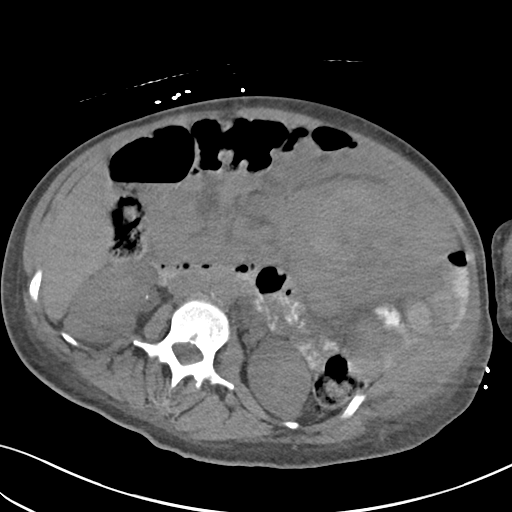
[im 73/98  soft-tissue]
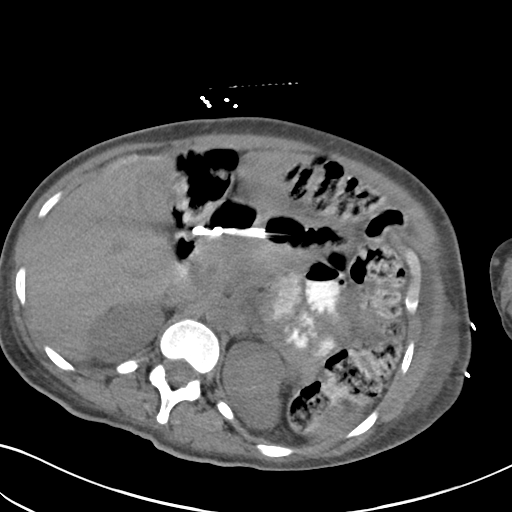
[im 73/98  bone]
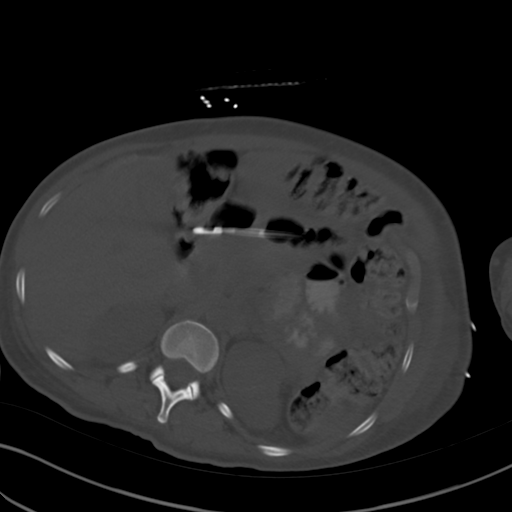
[im 83/98  soft-tissue]
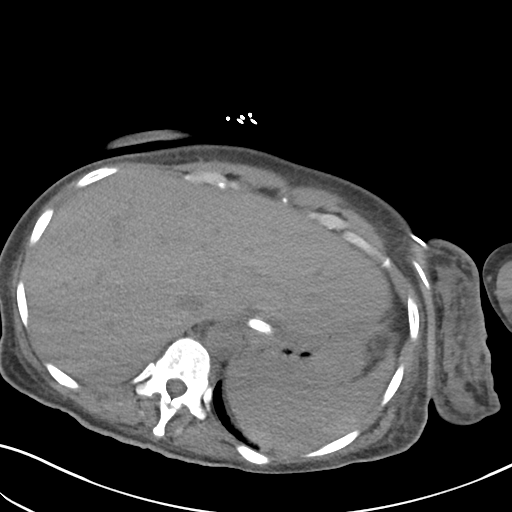
[im 93/98  soft-tissue]
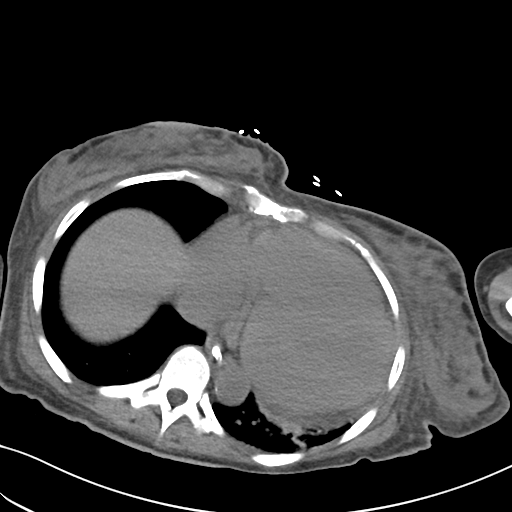

[Series 6: cor · coronal · 0.76mm/px · 3 of 95 slices shown]
[im 32/95  soft-tissue]
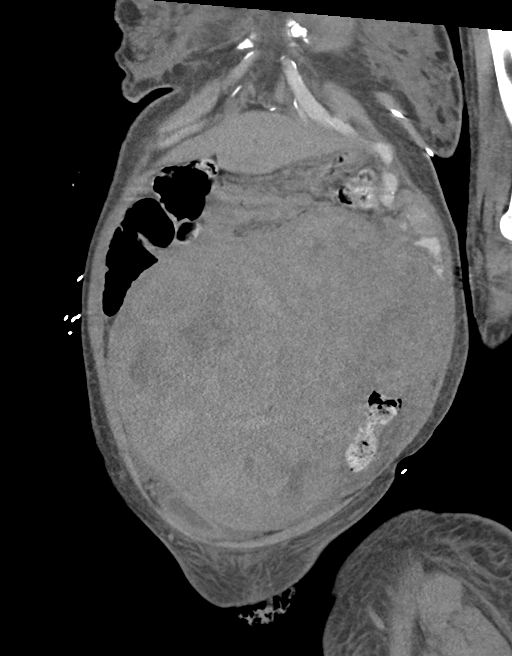
[im 42/95  soft-tissue]
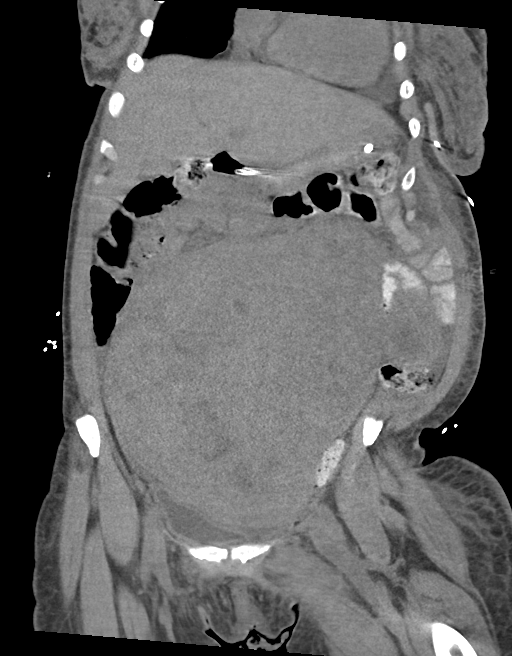
[im 53/95  soft-tissue]
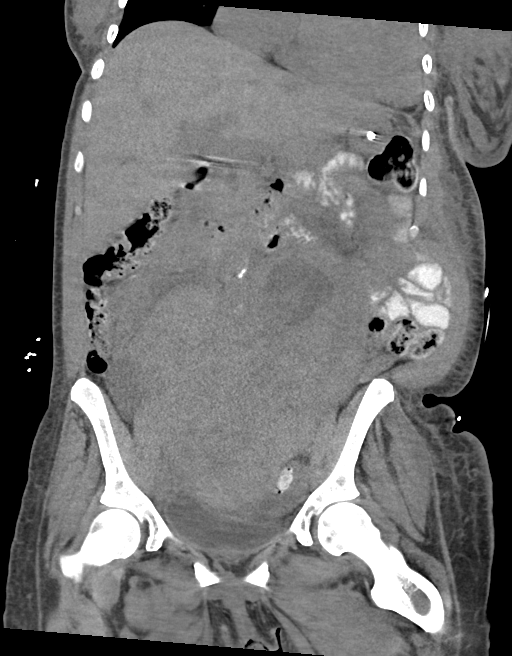

[14 of 46 positions shown; findings below may reference images not displayed]

FINDINGS: Lower chest: Atelectatic changes and/or scarring in left lung base.
Bases otherwise clear. Cardiomegaly and small pericardial effusion.
Heterogeneously dense breast tissue. Mild body wall edema of the
chest most pronounced left greater than right.

Hepatobiliary: No visible focal liver lesion. Redemonstrated
hepatomegaly. Normal liver attenuation. Smooth surface contour.
Gallbladder not well visualized, largely decompressed at the time of
exam.

Pancreas: No pancreatic ductal dilatation or surrounding
inflammatory changes.

Spleen: Normal in size. No concerning splenic lesions.

Adrenals/Urinary Tract: No discernible focal adrenal nodules or
masses. Bilateral cortical scarring. Nonobstructing calculi present
in both kidneys as well as additional vascular calcifications. No
discernible obstructive urolithiasis or hydronephrosis with multiple
calcifications in the deep pelvis appearing stable from prior likely
reflecting phleboliths. Mild bladder wall thickening and
perivesicular hazy stranding is noted.

Stomach/Bowel: Challenging assessment of the bowel due to
significant displacement and compression of numerous bowel loops due
to the large uterine masses as detailed below. Feeding tube tip
terminates at the level of the pylorus/duodenal bulb. Distal
esophagus and stomach are otherwise unremarkable. Duodenum with a
normal sweep across the midline abdomen. High attenuation enteric
contrast media has traversed to the level of the colon. No
discernible small bowel thickening. Questionable thickening and
edematous changes centered upon the rectosigmoid. A focal culprit
diverticulum is difficult to identify. Appendix is poorly
demonstrated. No convincing evidence of bowel obstruction.

Vascular/Lymphatic: Suboptimal assessment lymph nodes given absence
of contrast media and the large displaced mass effect of the uterine
masses. No clearly enlarged abdominopelvic adenopathy.
Atherosclerotic calcifications throughout the abdominal aorta and
branch vessels.

Reproductive: Massive enlargement of uterus which could reflect a
conglomerate of multiple leiomyomas given the heterogeneous on the
lobular appearance versus is a an irregularly degenerating solitary
fibroid, conglomerate size measures approximately 21.6 x 15.2 by
22.7 cm in size, not significantly changed accounting for
differences in measurement. Poor visualization of the ovaries. Poor
visualization of the cervix.

Other: Low-attenuation free fluid is distributed throughout the
abdomen and pelvis. Diffuse body wall edema. No free air. No bowel
containing hernia.

Musculoskeletal: No acute osseous abnormality or suspicious osseous
lesion. Multilevel degenerative changes are present in the imaged
portions of the spine.
IMPRESSION: 1. Large abdominopelvic mass, presumably arising from the uterus,
could reflect leiomyoma or leiomyosarcoma, incompletely
characterized on this exam. Overall size and appearance not
significantly changed from [DATE] exam. This results in marked
displacing mass effect of the abdominal viscera and complicates the
evaluation of the bowel and mesentery. Further limitations by the
absence of contrast media.
2. There is some likely circumferential thickening about the
rectosigmoid though a focal culprit diverticulum is not well
visualized. Could reflect a proctocolitis versus diverticulitis
given visualization of few scattered diverticula in the better
visualized portions of bowel.
3. Small to moderate volume simple attenuation free fluid throughout
the abdomen and pelvis.
4. Transesophageal tube tip terminates at the gastric
antrum/duodenal bulb.
5. Nonobstructing nephrolithiasis and bilateral renal vascular
calcification.
6. Cardiomegaly.  Pericardial effusion.
7.  Aortic Atherosclerosis ([UT]-[UT]).

## 2021-06-19 MED ORDER — KETOROLAC TROMETHAMINE 15 MG/ML IJ SOLN
15.0000 mg | Freq: Three times a day (TID) | INTRAMUSCULAR | Status: AC
  Administered 2021-06-19 – 2021-06-22 (×9): 15 mg via INTRAVENOUS
  Filled 2021-06-19 (×9): qty 1

## 2021-06-19 NOTE — Progress Notes (Signed)
RN called- pt with severe LLQ pain- having loose stools. Known massive uterine fibroids and current active menses. She was given Oxy IR as ordered without improvement in pain. Instructed RN to give additional 2.5 mg now and increase dose to 5mg  q 4hrs prn. GFR 60 so Toradol 15 mg IV q 8 hrs x 3 days also ordered. Symptoms concerning for acute diverticulitis so CT abd/pelvis with oral contrast only ordered

## 2021-06-19 NOTE — Progress Notes (Signed)
[facility] PROGRESS NOTE  Holly Bradley XTG:[id number] DOB: 1976/11/24 DOA: 05/15/2021 PCP: Mitzi Hansen, MD  Status: Remains inpatient appropriate because:Altered mental status, Unsafe d/c plan, and Inpatient level of care appropriate due to severity of illness  Dispo: The patient is from: Home              Anticipated d/c is to: Home with brother with home health              Patient currently is not medically stable to d/c.   Difficult to place patient Yes   Level of care: Med-Surg  Code Status: Full code except for no CPR Family Communication: Patient only DVT prophylaxis: SCDs with history of hemorrhagic stroke COVID vaccination status: Unknown    HPI: 45 y.o. female past medical history of essential hypertension, diabetes mellitus type 2 large uterine fibroids paroxysmal SVT, chronic diastolic heart failure paranoid psychosis comes into the ED on 05/16/2019 with confusion was found to have a right MCA occlusion underwent thrombectomy which was complicated by contrast extravasation and M3 coiling embolization and revascularization.  She subsequently developed subarachnoid hemorrhage repeat imaging revealed right basal ganglia infarct with a 3 mm left shift, she was treated with hypertonic saline in the ICU, on 05/17/2021 she developed respiratory distress and he was intubated CT of the head showed increased cytotoxic edema and left side shift.  No mental status improvement is now trach and with nasal collar track for tube feedings.  She has been treated with Ancef and completed a 7-day course for MSSA pneumonia, her blood cultures on 05/21/2021 were positive for staph epi.  Infectious disease was consulted and are on board she has remained off antibiotics since 2022 repeated blood cultures on 06/02/2021 have remained negative.  5/24 Admit to neurology after ED visit with AMS, found to have R MCA stroke, underwent mechanical thrombectomy, M3 coil embolization, subsequent  SAH 5/26 decreased mental status and respiratory distress, intubated 6/1 still not candidate for extubation with poor mentation and cough mechanics. Family unsure if pt would want trach. Staph epi bacteremia, continues on ancef 6/2 moving RUE RLE following some commands. decision to move forward with tracheostomy.   6/3 perc trach. Slight WBC increase but no fever, continues on ancef 6/4 Tolerating PSV/CPAP 6/5 WBC up to 17, last day of ancef for staph pna. Trying ATC  6/9 ID consulted for staph epi bacteremia . Felt contamination and stopped abx. 6/12 had fever spikes but nothing to explain  6/13 change to cuffless trach  6/24 downsized to 4.0 cuffless trach and capping trials initiate /26 decannulated successfully  Subjective: Alert and smiling.  Being fed breakfast.  Laughed at joke regarding consistency of current diet resembling Play-Doh  Objective: Vitals:   06/19/21 0416 06/19/21 0553  BP: 126/67   Pulse: 85   Resp: 18   Temp: 100.1 F (37.8 C) 98.5 F (36.9 C)  SpO2: 100%     Intake/Output Summary (Last 24 hours) at 06/19/2021 0744 Last data filed at 06/19/2021 0121 Gross per 24 hour  Intake 1313.85 ml  Output --  Net 1313.85 ml    Filed Weights   06/16/21 0500 06/17/21 0500 06/19/21 0500  Weight: 70 kg 71.2 kg 43.9 kg    Exam:  Constitutional: No acute distress, smiling and alert Respiratory: Lungs are clear to auscultation.  Dressing over prior tracheostomy site.  Stable on room air.  Pulse oximetry 99 to 100%. Cardiovascular: S1-S2, no peripheral edema, no JVD.  Regular pulse and normotensive. Abdomen:  LBM 6/26,  Neurologic: CN 2-12 grossly intact except for left facial droop when smiling.  Sensation intact on right side but diminished on the left side with patient reporting sharp sensation as dull.  DTR normal.  Strength 3+-4/5 on the right with purposeful movement.  Dense hemiplegia involving left side.  Strong forceful spontaneous cough.   Psychiatric:  Alert and oriented x3.  Pleasant affect.   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 Continue aspirin and Lipitor. Cont PT/OT; OOB to chair TID  Cytotoxic cerebral edema/hypernatremia Initially treated with 3% saline  Sodium normalized with most recent reading 137 on 6/15 Continue lower volume free water per tube  Pain syndrome Continue low dose Oxy IR 2.5 mg prn  Chronic constipation Cont Chronulac 10 g BID IV fluid hydration as above  Hypertension/grade 2 diastolic dysfunction Echocardiogram this admission reveals preserved LVEF with grade 2 diastolic dysfunction Continue carvedilol, hydralazine, Isordil, Entresto, Catapres and Norvasc  Dysphagia due to recent stroke Continue D1 diet with nectar thick liquid Continue IV fluids for now intake of oral fluids improved Continue nocturnal tube feedings until oral intake improves and calorie count yielded about a 30% intake of orals over 3 days   Acute respiratory failure with hypoxia status post tracheostomy/MSSA pneumonia Tolerated capping trials and was decannulated on 6/26 Completed 7 days of cefazolin   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   Currently experiencing menses Will follow daily CBC   Protein calorie malnutrition Body mass index is 16.11 kg/m.  Continue dysphagia 1 diet with nectar thick liquids Continue nocturnal tube feedings with Jevity and Prosource per tube  History of paroxysmal SVT: Continue home amiodarone dose.  Acute respiratory failure with hypoxia status post tracheostomy/MSSA pneumonia Tolerated capping trials and was decannulated on 6/26 Completed 7 days of cefazolin  Fever of unknown  source/resolved: Blood cultures on 5/30 with staph epidermis to have hominis-both likely contaminants Repeat blood cultures on 6/6 with staph epidermis Repeat blood cultures on 6/11 negative No fever for the past 9 days  Thrombocytosis: Acting as an acute phase reactant with platelets trending downward 531,000 as of 6/16 and as of 6/24 platelets have normalized to 207,000   Data Reviewed: Basic Metabolic Panel: Recent Labs  Lab 06/15/21 0642 06/18/21 0813  NA 132* 135  K 4.1 4.3  CL 104 105  CO2 21* 22  GLUCOSE 116* 109*  BUN 34* 29*  CREATININE 1.14* 0.93  CALCIUM 9.5 10.0  MG 1.9 1.8  PHOS 4.2 3.9   Liver Function Tests: Recent Labs  Lab 06/15/21 0642  AST 23  ALT 15  ALKPHOS 142*  BILITOT 0.4  PROT 6.8  ALBUMIN 2.7*   No results for input(s): LIPASE, AMYLASE in the last 168 hours. No results for input(s): AMMONIA in the last 168 hours. CBC: Recent Labs  Lab 06/15/21 0642  WBC 4.6  NEUTROABS 3.5  HGB 9.0*  HCT 29.8*  MCV 82.8  PLT 207    Cardiac Enzymes: No results for input(s): CKTOTAL, CKMB, CKMBINDEX, TROPONINI in the last 168 hours. BNP (last 3 results) No results for input(s): BNP in the last 8760 hours.  ProBNP (last 3 results) No results for input(s): PROBNP in the last 8760 hours.  CBG: Recent Labs  Lab 06/18/21 1129 06/18/21 1604 06/18/21  2044 06/19/21 0101 06/19/21 0438  GLUCAP 101* 96 121* 114* 127*    No results found for this or any previous visit (from the past 240 hour(s)).   Studies: DG Abd Portable 1V  Result Date: 06/18/2021 CLINICAL DATA:  Abdominal distension EXAM: PORTABLE ABDOMEN - 1 VIEW COMPARISON:  05/30/2021, CT from 05/17/2021 FINDINGS: Feeding catheter is noted in the distal aspect of the stomach. Scattered large and small bowel gas is noted. No obstructive changes are noted. Large soft tissue density is noted in the pelvis similar to that seen on prior CT examination consistent with uterine fibroid change.  IMPRESSION: Stable soft tissue mass in the pelvis similar to that seen on prior CT examination. No acute abnormality noted. Electronically Signed   By: Inez Catalina M.D.   On: 06/18/2021 10:16    Scheduled Meds:  amiodarone  200 mg Per Tube Daily   amLODipine  10 mg Per Tube Daily   aspirin  81 mg Per Tube Daily   atorvastatin  40 mg Per Tube Daily   carvedilol  25 mg Per Tube BID WC   chlorhexidine gluconate (MEDLINE KIT)  15 mL Mouth Rinse BID   Chlorhexidine Gluconate Cloth  6 each Topical Daily   cloNIDine  0.1 mg Per Tube TID   feeding supplement (JEVITY 1.5 CAL/FIBER)  900 mL Per Tube Q24H   feeding supplement (PROSource TF)  45 mL Per Tube Daily   ferrous sulfate  300 mg Per Tube BID WC   free water  100 mL Per Tube Q6H   hydrALAZINE  100 mg Per Tube Q8H   isosorbide dinitrate  40 mg Per Tube TID   lactulose  10 g Per Tube BID   mouth rinse  15 mL Mouth Rinse 10 times per day   megestrol  80 mg Per Tube TID   multivitamin with minerals  1 tablet Per Tube Daily   pantoprazole sodium  40 mg Per Tube Daily   sacubitril-valsartan  1 tablet Per Tube BID   sodium chloride flush  10-40 mL Intracatheter Q12H   Continuous Infusions:  sodium chloride 75 mL/hr at 06/19/21 0122    Principal Problem:   Cerebrovascular accident (CVA) ([place]) Active Problems:   Acute respiratory failure with hypoxia (HCC)   Fibroid   Iron deficiency anemia due to chronic blood loss   Cerebral edema (HCC)   Chronic HFrEF (heart failure with reduced ejection fraction) ([place])   Pneumonia due to methicillin susceptible Staphylococcus aureus (HCC)   Abdominal distention   Encounter for feeding tube placement   Encounter for intubation   Ileus ([place])   Staphylococcus epidermidis bacteremia   Status post tracheostomy ([place])   Abnormal uterine bleeding (AUB)   Hemorrhagic stroke ([place])   Hemiparesis ([place])   Consultants: PCCM Palliative medicine OB-GYN CIR General surgery Infectious  disease  Procedures: Echocardiogram IR transcatheter embolization on 5/24 EEG IR placement of tracheostomy on 6/3  Antibiotics: Cefazolin 5/30 through 6/5   Time spent: 45 minutes    Erin Hearing ANP  [facility] 7 am - 330 pm/M-F for direct patient care and secure chat Please refer to Amion for contact info 35  days

## 2021-06-19 NOTE — Plan of Care (Signed)
  Problem: Education: Goal: Knowledge of disease or condition will improve Outcome: Progressing Goal: Knowledge of secondary prevention will improve Outcome: Progressing   Problem: Education: Goal: Knowledge of General Education information will improve Description: Including pain rating scale, medication(s)/side effects and non-pharmacologic comfort measures Outcome: Progressing   Problem: Health Behavior/Discharge Planning: Goal: Ability to manage health-related needs will improve Outcome: Progressing   Problem: Clinical Measurements: Goal: Ability to maintain clinical measurements within normal limits will improve Outcome: Progressing Goal: Will remain free from infection Outcome: Progressing Goal: Diagnostic test results will improve Outcome: Progressing Goal: Respiratory complications will improve Outcome: Progressing Goal: Cardiovascular complication will be avoided Outcome: Progressing   Problem: Activity: Goal: Risk for activity intolerance will decrease Outcome: Progressing   Problem: Nutrition: Goal: Adequate nutrition will be maintained Outcome: Progressing   Problem: Coping: Goal: Level of anxiety will decrease Outcome: Progressing   Problem: Elimination: Goal: Will not experience complications related to bowel motility Outcome: Progressing Goal: Will not experience complications related to urinary retention Outcome: Progressing   Problem: Pain Managment: Goal: General experience of comfort will improve Outcome: Progressing   Problem: Safety: Goal: Ability to remain free from injury will improve Outcome: Progressing   Problem: Skin Integrity: Goal: Risk for impaired skin integrity will decrease Outcome: Progressing

## 2021-06-19 NOTE — Plan of Care (Signed)
  Problem: Education: Goal: Knowledge of disease or condition will improve Outcome: Progressing Goal: Knowledge of secondary prevention will improve Outcome: Progressing   

## 2021-06-19 NOTE — Progress Notes (Signed)
Physical Therapy Treatment Patient Details Name: Holly Bradley MRN: [id number] DOB: 08/05/1976 Today's Date: 06/19/2021    History of Present Illness 45 yo female s/p mechanical thrombectomy on 5/24 of right MCA M1/M2/M3 and embolization of right MCA M3 branch due to contrast extravasation. Pt re-intubated 5/26. Repeat head CT on 5/26 showed increased cytotoxic edema and mildly increased leftward midline shift, now 5 mm, decreased SAH and IVH. Abdominal CT showing 22.9 cm mass, possibly consistent with uterine fibroids, no lymphadenopathy. s/p trach 6/3. PMH fibriods HTN HF paranoid psychosis recent hospitalizations january and february 2022    PT Comments    Pt more interactive during session today and showed pleasure with music on in room. Pt had difficulty pulling with RUE during R lateral scoot yesterday so today went L to allow pt to push with RUE and RLE. She was not able to use R side any more effectively in this manner. Max A +2 needed for scoot into chair. Worked on sitting balance EOB which improved after 3 mins lean onto R elbow. Pt continues to have L shoulder discomfort. PT will continue to follow.    Follow Up Recommendations  SNF     Equipment Recommendations  Wheelchair (measurements PT);Wheelchair cushion (measurements PT);Hospital bed;Other (comment) (hoyer lift)    Recommendations for Other Services Rehab consult     Precautions / Restrictions Precautions Precautions: Fall;Other (comment) Precaution Comments: L hemiparesis, PRAFO,  resting hand splint,  trach, SBP < 160, cortrak Required Braces or Orthoses: Other Brace Other Brace: L resting hand splint (4 hours on/4 hours off) Restrictions Weight Bearing Restrictions: No    Mobility  Bed Mobility Overal bed mobility: Needs Assistance Bed Mobility: Rolling;Sidelying to Sit Rolling: Max assist Sidelying to sit: Total assist       General bed mobility comments: max +2 for rolling L with hand over hand  guidance of R hand to rail and manual facilitation of RLE under L to assist it off bed. Tot A for elevation of trunk into sitting and pt had difficulty with wt shift from L to R    Transfers Overall transfer level: Needs assistance Equipment used: None Transfers: Lateral/Scoot Transfers          Lateral/Scoot Transfers: Max assist;+2 physical assistance General transfer comment: scooted L today to allow pt to push with R arm and leg since she did not participate much with pulling yesterday. Same result though as pt did not use RUE/LE effectively and required max A +2 for scooting  Ambulation/Gait                 Stairs             Wheelchair Mobility    Modified Rankin (Stroke Patients Only) Modified Rankin (Stroke Patients Only) Pre-Morbid Rankin Score: No symptoms Modified Rankin: Severe disability     Balance Overall balance assessment: Needs assistance Sitting-balance support: Single extremity supported;Feet supported Sitting balance-Leahy Scale: Poor Sitting balance - Comments: began sitting position with R lean onto elbow, after 3 mins of this she was able to maintain midline with min-guard A Postural control: Left lateral lean Standing balance support: Bilateral upper extremity supported;During functional activity Standing balance-Leahy Scale: Zero                              Cognition Arousal/Alertness: Awake/alert Behavior During Therapy: Flat affect Overall Cognitive Status: Difficult to assess Area of Impairment: Following commands;Attention;Safety/judgement;Problem solving;Awareness;Memory  Current Attention Level: Sustained Memory: Decreased short-term memory (repeated cues) Following Commands: Follows one step commands with increased time Safety/Judgement: Decreased awareness of safety Awareness: Emergent Problem Solving: Slow processing;Requires verbal cues;Requires tactile cues;Difficulty  sequencing;Decreased initiation General Comments: followed simple commands today and participated appropriately      Exercises General Exercises - Lower Extremity Long Arc Quad: Right;Seated;AROM;5 reps Other Exercises Other Exercises: chest stretching in sitting Other Exercises: cervical retraction in sitting    General Comments General comments (skin integrity, edema, etc.): VSS      Pertinent Vitals/Pain Pain Assessment: Faces Faces Pain Scale: Hurts little more Pain Location: L shoulder Pain Descriptors / Indicators: Discomfort;Grimacing Pain Intervention(s): Limited activity within patient's tolerance;Monitored during session;Repositioned    Home Living                      Prior Function            PT Goals (current goals can now be found in the care plan section) Acute Rehab PT Goals Patient Stated Goal: none stated PT Goal Formulation: With patient Time For Goal Achievement: 07/02/21 Potential to Achieve Goals: Fair Progress towards PT goals: Progressing toward goals    Frequency    Min 3X/week      PT Plan Current plan remains appropriate    Co-evaluation              AM-PAC PT "6 Clicks" Mobility   Outcome Measure  Help needed turning from your back to your side while in a flat bed without using bedrails?: Total Help needed moving from lying on your back to sitting on the side of a flat bed without using bedrails?: Total Help needed moving to and from a bed to a chair (including a wheelchair)?: Total Help needed standing up from a chair using your arms (e.g., wheelchair or bedside chair)?: Total Help needed to walk in hospital room?: Total Help needed climbing 3-5 steps with a railing? : Total 6 Click Score: 6    End of Session Equipment Utilized During Treatment: Gait belt Activity Tolerance: Patient tolerated treatment well Patient left: with call bell/phone within reach;in chair;with chair alarm set;Other (comment) (maximove  pad under pt) Nurse Communication: Mobility status;Need for lift equipment PT Visit Diagnosis: Hemiplegia and hemiparesis;Other abnormalities of gait and mobility (R26.89);Unsteadiness on feet (R26.81);Muscle weakness (generalized) (M62.81);Difficulty in walking, not elsewhere classified (R26.2);Other symptoms and signs involving the nervous system (R29.898);Apraxia (R48.2) Hemiplegia - Right/Left: Left Hemiplegia - dominant/non-dominant: Non-dominant Hemiplegia - caused by: Cerebral infarction;Nontraumatic SAH;Nontraumatic intracerebral hemorrhage     Time: 6256-3893 PT Time Calculation (min) (ACUTE ONLY): 32 min  Charges:  $Therapeutic Activity: 23-37 mins                     Leighton Roach, [place]  Pager [phone] Office [place] 06/19/2021, 3:47 PM

## 2021-06-20 DIAGNOSIS — Z7189 Other specified counseling: Secondary | ICD-10-CM

## 2021-06-20 DIAGNOSIS — Z515 Encounter for palliative care: Secondary | ICD-10-CM

## 2021-06-20 LAB — GLUCOSE, CAPILLARY
Glucose-Capillary: 107 mg/dL — ABNORMAL HIGH (ref 70–99)
Glucose-Capillary: 114 mg/dL — ABNORMAL HIGH (ref 70–99)
Glucose-Capillary: 116 mg/dL — ABNORMAL HIGH (ref 70–99)
Glucose-Capillary: 119 mg/dL — ABNORMAL HIGH (ref 70–99)
Glucose-Capillary: 124 mg/dL — ABNORMAL HIGH (ref 70–99)
Glucose-Capillary: 92 mg/dL (ref 70–99)

## 2021-06-20 LAB — CBC WITH DIFFERENTIAL/PLATELET
Abs Immature Granulocytes: 0.01 10*3/uL (ref 0.00–0.07)
Basophils Absolute: 0 10*3/uL (ref 0.0–0.1)
Basophils Relative: 1 %
Eosinophils Absolute: 0 10*3/uL (ref 0.0–0.5)
Eosinophils Relative: 0 %
HCT: 26 % — ABNORMAL LOW (ref 36.0–46.0)
Hemoglobin: 8.1 g/dL — ABNORMAL LOW (ref 12.0–15.0)
Immature Granulocytes: 0 %
Lymphocytes Relative: 14 %
Lymphs Abs: 0.5 10*3/uL — ABNORMAL LOW (ref 0.7–4.0)
MCH: 26.1 pg (ref 26.0–34.0)
MCHC: 31.2 g/dL (ref 30.0–36.0)
MCV: 83.9 fL (ref 80.0–100.0)
Monocytes Absolute: 0.6 10*3/uL (ref 0.1–1.0)
Monocytes Relative: 16 %
Neutro Abs: 2.6 10*3/uL (ref 1.7–7.7)
Neutrophils Relative %: 69 %
Platelets: 169 10*3/uL (ref 150–400)
RBC: 3.1 MIL/uL — ABNORMAL LOW (ref 3.87–5.11)
WBC: 3.7 10*3/uL — ABNORMAL LOW (ref 4.0–10.5)
nRBC: 0 % (ref 0.0–0.2)

## 2021-06-20 MED ORDER — HYDROCORT-PRAMOXINE (PERIANAL) 1-1 % EX FOAM
1.0000 | Freq: Two times a day (BID) | CUTANEOUS | Status: DC
  Administered 2021-06-20 – 2021-06-26 (×13): 1 via RECTAL
  Filled 2021-06-20 (×5): qty 10

## 2021-06-20 MED ORDER — CIPROFLOXACIN IN D5W 200 MG/100ML IV SOLN
200.0000 mg | Freq: Two times a day (BID) | INTRAVENOUS | Status: DC
  Administered 2021-06-20 – 2021-06-22 (×6): 200 mg via INTRAVENOUS
  Filled 2021-06-20 (×7): qty 100

## 2021-06-20 MED ORDER — METRONIDAZOLE 500 MG/100ML IV SOLN
500.0000 mg | Freq: Three times a day (TID) | INTRAVENOUS | Status: DC
  Administered 2021-06-20 – 2021-06-23 (×9): 500 mg via INTRAVENOUS
  Filled 2021-06-20 (×9): qty 100

## 2021-06-20 NOTE — Progress Notes (Signed)
  Speech Language Pathology Treatment: Cognitive-Linquistic  Patient Details Name: Holly Bradley MRN: [id number] DOB: Oct 07, 1976 Today's Date: 06/20/2021 Time: 1535-1550 SLP Time Calculation (min) (ACUTE ONLY): 15 min  Assessment / Plan / Recommendation Clinical Impression  Pt alert, attentive. Her brother and brother in law were at bedside.  Holly Bradley initially with no verbal output upon multiple attempts.  When provided with visual and tactile cues and explicit instruction to repeat clinician, she began to produce automatic sequences and in unison recited DOW, MOY and counted to ten.  At that point she was able to answer biographical questions, then proceeded to ask her brother questions and make particular food requests (a hamburger and a croissant!)  Pt really needs to be reminded to use her voice and prompted a few times.  Speech output is dysarthric with some phonatory breaks, but fully intelligible.  She is ready for repeat MBS - will plan for next date. Pt agreeable.   HPI HPI: 45 yo female s/p mechanical thrombectomy on 5/24 of right MCA M1/M2/M3 and embolization of right MCA M3 branch due to contrast extravasation. Pt re-intubated 5/26. Repeat head CT on 5/26 showed increased cytotoxic edema and mildly increased leftward midline shift, now 5 mm, decreased SAH and IVH. Abdominal CT showing 22.9 cm mass, possibly consistent with uterine fibroids, no lymphadenopathy. s/p trach 6/3. PMH fibriods HTN HF paranoid psychosis recent hospitalizations january and february 2022      SLP Plan  MBS       Recommendations  Diet recommendations: Dysphagia 1 (puree);Nectar-thick liquid Liquids provided via: Teaspoon;Cup Medication Administration: Via alternative means Supervision: Patient able to self feed;Staff to assist with self feeding;Full supervision/cueing for compensatory strategies Compensations: Slow rate;Small sips/bites;Monitor for anterior loss                Oral Care  Recommendations: Oral care BID SLP Visit Diagnosis: Dysarthria and anarthria (R47.1) Plan: MBS       GO               Holly Bradley L. Holly Bradley, [place] Office number [phone] Pager [phone]  Holly Bradley 06/20/2021, 3:53 PM

## 2021-06-20 NOTE — Plan of Care (Signed)
  Problem: Education: Goal: Knowledge of disease or condition will improve Outcome: Progressing Goal: Knowledge of secondary prevention will improve Outcome: Progressing   

## 2021-06-20 NOTE — Progress Notes (Signed)
Daily Progress Note   [name]       Date: 06/20/2021 DOB: [birth year]  Age: 45 y.o. MRN#: [id number] Attending Physician: Jonetta Osgood, MD Primary Care Physician: Mitzi Hansen, MD Admit Date: 05/15/2021  Reason for Consultation/Follow-up: Establishing goals of care  Subjective: Called by patient's brother requesting support. See below.  Length of Stay: 36  Current Medications: Scheduled Meds:  . amiodarone  200 mg Per Tube Daily  . amLODipine  10 mg Per Tube Daily  . aspirin  81 mg Per Tube Daily  . atorvastatin  40 mg Per Tube Daily  . carvedilol  25 mg Per Tube BID WC  . chlorhexidine gluconate (MEDLINE KIT)  15 mL Mouth Rinse BID  . Chlorhexidine Gluconate Cloth  6 each Topical Daily  . cloNIDine  0.1 mg Per Tube TID  . feeding supplement (JEVITY 1.5 CAL/FIBER)  900 mL Per Tube Q24H  . feeding supplement (PROSource TF)  45 mL Per Tube Daily  . ferrous sulfate  300 mg Per Tube BID WC  . free water  100 mL Per Tube Q6H  . hydrALAZINE  100 mg Per Tube Q8H  . hydrocortisone-pramoxine  1 applicator Rectal BID  . isosorbide dinitrate  40 mg Per Tube TID  . ketorolac  15 mg Intravenous Q8H  . lactulose  10 g Per Tube BID  . mouth rinse  15 mL Mouth Rinse 10 times per day  . megestrol  80 mg Per Tube TID  . multivitamin with minerals  1 tablet Per Tube Daily  . pantoprazole sodium  40 mg Per Tube Daily  . sacubitril-valsartan  1 tablet Per Tube BID  . sodium chloride flush  10-40 mL Intracatheter Q12H    Continuous Infusions: . sodium chloride 75 mL/hr at 06/20/21 0548  . ciprofloxacin 200 mg (06/20/21 1006)  . metronidazole 500 mg (06/20/21 1127)    PRN Meds: acetaminophen **OR** acetaminophen (TYLENOL) oral liquid 160 mg/5 mL **OR** acetaminophen, food  thickener, loperamide HCl, ondansetron (ZOFRAN) IV, oxyCODONE, simethicone, sodium chloride flush, traMADol  Physical Exam Constitutional:      General: She is not in acute distress.    Comments: Speaking with speech therapy!  Cardiovascular:     Rate and Rhythm: Normal rate and regular rhythm.  Pulmonary:     Effort: Pulmonary effort is normal.  Abdominal:     Palpations: There is mass.  Skin:    General: Skin is warm and dry.            Vital Signs: BP 132/68 (BP Location: Left Arm)   Pulse 79   Temp 97.7 F (36.5 C) (Oral)   Resp 16   Ht '5\' 5"'  (1.651 m)   Wt 40.8 kg   LMP  (LMP Unknown) Comment: pt not alert  SpO2 100%   BMI 14.97 kg/m  SpO2: SpO2: 100 % O2 Device: O2 Device: Room Air O2 Flow Rate: O2 Flow Rate (L/min): 5 L/min  Intake/output summary:   Intake/Output Summary (Last 24 hours) at 06/20/2021 1605 Last data filed at 06/20/2021 1255 Gross per 24 hour  Intake 2261.41 ml  Output --  Net 2261.41 ml    LBM: Last BM  Date: 06/20/21 Baseline Weight: Weight: 72.6 kg Most recent weight: Weight: 40.8 kg      Flowsheet Rows    Flowsheet Row Most Recent Value  Intake Tab   Referral Department Critical care  Unit at Time of Referral ICU  Palliative Care Primary Diagnosis Neurology  Date Notified 05/22/21  Palliative Care Type New Palliative care  Reason for referral Clarify Goals of Care  Date of Admission 05/15/21  Date first seen by Palliative Care 05/23/21  # of days Palliative referral response time 1 Day(s)  # of days IP prior to Palliative referral 7  Clinical Assessment   Psychosocial & Spiritual Assessment   Palliative Care Outcomes        Patient Active Problem List   Diagnosis Date Noted  . Constipation   . Uterine fibroid   . Left hemiparesis ([place])   . Hemorrhagic stroke ([place])   . Hemiparesis ([place])   . Status post tracheostomy ([place])   . Abnormal uterine bleeding (AUB)   . Abdominal distention   . Encounter for feeding tube  placement   . Encounter for intubation   . Ileus ([place])   . Staphylococcus epidermidis bacteremia   . Fibroid 05/25/2021  . Iron deficiency anemia due to chronic blood loss 05/25/2021  . Cerebral edema ([place]) 05/25/2021  . Chronic HFrEF (heart failure with reduced ejection fraction) ([place]) 05/25/2021  . Pneumonia due to methicillin susceptible Staphylococcus aureus ([place]) 05/25/2021  . Acute respiratory failure with hypoxia ([place])   . Cerebrovascular accident (CVA) ([place]) 05/15/2021    Palliative Care Assessment & Plan   HPI: 45 y.o. female  with past medical history of uncontrolled HTN, tobacco use disorder, uterine fibroids, heart failure, paroxysmal SVT, paranoid psychosis admitted on 05/15/2021 with AMS. She was diagnosed with acute large MCA stroke with subarachnoid hemorrhage and midline shift requiring mechanical ventilation for airway protection and hyperosmolar therapy for cerebral edema. PMT consulted to discuss [place]. Lurline Idol was placed 6/3.   Assessment: Called to bedside by brother Holly Bradley.  Holly Bradley is working with speech therapy and talking!  Notified by speech therapy for plans for repeat MBS tomorrow.  Holly Bradley requests to speak outside room - he shares his concerns that he will not be able to provide amount of care that Holly Bradley needs at home. He asks about what resources are available for him to help support at home. He feels he will need much more support than what is available.   He shares his frustrations with medicaid application and not having durable power of attorney.  We discuss possibility of facility placement if Holly Bradley is not able to provide level of care Holly Bradley needs at home - he does not like this option, he feels as though Holly Bradley will not thrive at a facility. We discuss this is really the only alternative to him taking her home. We discuss that this could be short-term - possibly as she continues to improve he could bring her home and care for her. We all discussed  possibility of more support in home when she gets Holly Bradley Medicaid, maybe PACE? We discuss uncertainty of all of this.   Recommendations/Plan: Continue current plan of care Brother is concerned he cannot provide level of care she needs at home, possibly interested in facility placement but asking for time to consider Brother would appreciate any additional input from transition of care team/financial advisor - he is very frustrated with medicaid transfer from [place] to [place] and does not know what to expect, he would like  to know what sort of resources are available to him if he were to take Holly Bradley home now or in the future after a stay at a facility  Goals of Care and Additional Recommendations: Limitations on Scope of Treatment: Full Scope Treatment  Code Status: Limited code - NO CPR  Prognosis:  Unable to determine  Discharge Planning: To Be Determined  Care plan was discussed with patient's brother  Thank you for allowing the Palliative Medicine Team to assist in the care of this patient.   Total Time 30 minutes Prolonged Time Billed  no    Greater than 50%  of this time was spent counseling and coordinating care related to the above assessment and plan.   Juel Burrow, DNP, [facility] Palliative Medicine Team Team Phone # [phone]  Pager [phone]

## 2021-06-20 NOTE — Progress Notes (Addendum)
This chaplain joined the Pt., notary and two witnesses for the notarizing of the Pt. Advance Directive:  HCPOA and Living Will.  The Pt. named Lurae Hornbrook as her healthcare agent.  The chaplain gave the original AD, along with one copy to the Pt.  The chaplain scanned a copy of the Pt. AD to the Pt. EMR.  The Pt.'s copies of the AD were placed in the Pt. personal belongings bag.  The chaplain updated the Pt. RN-Nate.  This chaplain is available for F/U spiritual care as needed.

## 2021-06-20 NOTE — Progress Notes (Signed)
This chaplain responded to NP-Allison Lissa Merlin' consult to create the Pt. Advance Directive:  HCPOA and Living Will.  The chaplain was updated by the Pt. RN-Nate.  The Pt. appropriately nodded confirming her interest in participating in Advance Directive education and the naming of a healthcare decision maker.  The chaplain will contact the notary and witnesses for notarizing of the Pt. AD.  This chaplain is available for F/U spiritual care as needed, [phone].

## 2021-06-20 NOTE — Progress Notes (Addendum)
[facility] PROGRESS NOTE  Holly Bradley HEN:[id number] DOB: 1975/12/25 DOA: 05/15/2021 PCP: Mitzi Hansen, MD  Status: Remains inpatient appropriate because:Altered mental status, Unsafe d/c plan, and Inpatient level of care appropriate due to severity of illness  Dispo: The patient is from: Home              Anticipated d/c is to: Home with brother with home health              Patient currently is not medically stable to d/c.   Difficult to place patient Yes   Level of care: Med-Surg  Code Status: Full code except for no CPR Family Communication: Patient only DVT prophylaxis: SCDs with history of hemorrhagic stroke COVID vaccination status: Unknown    HPI: 45 y.o. female past medical history of essential hypertension, diabetes mellitus type 2 large uterine fibroids paroxysmal SVT, chronic diastolic heart failure paranoid psychosis comes into the ED on 05/16/2019 with confusion was found to have a right MCA occlusion underwent thrombectomy which was complicated by contrast extravasation and M3 coiling embolization and revascularization.  She subsequently developed subarachnoid hemorrhage repeat imaging revealed right basal ganglia infarct with a 3 mm left shift, she was treated with hypertonic saline in the ICU, on 05/17/2021 she developed respiratory distress and he was intubated CT of the head showed increased cytotoxic edema and left side shift.  No mental status improvement is now trach and with nasal collar track for tube feedings.  She has been treated with Ancef and completed a 7-day course for MSSA pneumonia, her blood cultures on 05/21/2021 were positive for staph epi.  Infectious disease was consulted and are on board she has remained off antibiotics since 2022 repeated blood cultures on 06/02/2021 have remained negative.  5/24 Admit to neurology after ED visit with AMS, found to have R MCA stroke, underwent mechanical thrombectomy, M3 coil embolization, subsequent  SAH 5/26 decreased mental status and respiratory distress, intubated 6/1 still not candidate for extubation with poor mentation and cough mechanics. Family unsure if pt would want trach. Staph epi bacteremia, continues on ancef 6/2 moving RUE RLE following some commands. decision to move forward with tracheostomy.   6/3 perc trach. Slight WBC increase but no fever, continues on ancef 6/4 Tolerating PSV/CPAP 6/5 WBC up to 17, last day of ancef for staph pna. Trying ATC  6/9 ID consulted for staph epi bacteremia . Felt contamination and stopped abx. 6/12 had fever spikes but nothing to explain  6/13 change to cuffless trach  6/24 downsized to 4.0 cuffless trach and capping trials initiate /26 decannulated successfully  Subjective: Patient was experiencing significant left lower quadrant abdominal pain yesterday that was not relieved by lower dose Oxy IR -narcotic dose increased and scheduled IV Toradol added.  Today patient states she is not having any discomfort.  Objective: Vitals:   06/20/21 0519 06/20/21 0742  BP: 135/74 (!) 143/63  Pulse: 75 79  Resp:  18  Temp:  98.7 F (37.1 C)  SpO2:  100%    Intake/Output Summary (Last 24 hours) at 06/20/2021 0858 Last data filed at 06/20/2021 0547 Gross per 24 hour  Intake 2021.41 ml  Output --  Net 2021.41 ml    Filed Weights   06/17/21 0500 06/19/21 0500 06/20/21 0332  Weight: 71.2 kg 43.9 kg 40.8 kg    Exam:  Constitutional: Alert and calm and in no acute distress. Respiratory: Lungs are clear, previous tracheostomy site covered by dry dressing.  Stable on room air without  increased work of breathing oxygen saturations 100%. Cardiovascular: Heart sounds S1-S2.  Pulse regular.  Tensive, no peripheral edema Abdomen:  LBM 6/29, LLQ pain has now resolved and is not present on palpation.  Normoactive bowel sounds Neurologic: CN 2-12 grossly intact except for left facial droop when smiling.  Sensation intact on right side but  diminished on the left side with patient reporting sharp sensation as dull.  DTR normal.  Strength 3+-4/5 on the right with purposeful movement.  Dense hemiplegia involving left side.  Strong forceful spontaneous cough.   Psychiatric: Awake, smiles at times.  Does have a flat affect.   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 Continue aspirin and Lipitor. Cont PT/OT; OOB to chair TID  Cytotoxic cerebral edema/hypernatremia Initially treated with 3% saline  Sodium normalized with most recent reading 137 on 6/15 Continue lower volume free water per tube  Left lower quadrant abdominal pain/suspected diverticulitis and proctocolitis Worsened pain yesterday evening.  Low-grade fever T-max 100.8 overnight.  Today patient denies any abdominal pain. CT abdomen pelvis completed.  Patient was unable to tolerate enough oral contrast for adequate imaging.  In addition due to the bulk of her uterine fibroids anatomy displaced and difficult to evaluate.  Radiologist questions possible diverticulitis as well as proctocolitis. Begin Cipro and Flagyl IV x7 days Begin Proctofoam per rectum x7 days Continue scheduled IV Toradol and per tube Oxy IR as needed for pain  Chronic constipation Cont Chronulac 10 g BID IV fluid hydration as above  Hypertension/grade 2 diastolic dysfunction Echocardiogram this admission reveals preserved LVEF with grade 2 diastolic dysfunction Continue carvedilol, hydralazine, Isordil, Entresto, Catapres and Norvasc  Dysphagia due to recent stroke Continue D1 diet with nectar thick liquid Continue IV fluids for now intake of oral fluids improved Continue nocturnal tube feedings until oral intake improves and calorie count  yielded about a 30% intake of orals over 3 days   Acute respiratory failure with hypoxia status post tracheostomy/MSSA pneumonia Tolerated capping trials and was decannulated on 6/26 Completed 7 days of cefazolin   Iron deficiency anemia due to uterine blood loss: Continue oral iron.   Currently experiencing menses Will follow daily CBC -as of 6/29 hemoglobin has dropped from 9.0 to 8.1  Protein calorie malnutrition Body mass index is 14.97 kg/m.  Continue dysphagia 1 diet with nectar thick liquids Continue nocturnal tube feedings with Jevity and Prosource per tube  History of paroxysmal SVT: Continue home amiodarone dose.  Acute respiratory failure with hypoxia status post tracheostomy/MSSA pneumonia Tolerated capping trials and was decannulated on 6/26 Completed 7 days of cefazolin  Fever of unknown source/resolved: Blood cultures on 5/30 with staph epidermis to have hominis-both likely contaminants Repeat blood cultures on 6/6 with staph epidermis Repeat blood cultures on 6/11 negative No fever for the past 9 days  Thrombocytosis: Acting as an acute phase reactant with platelets trending downward 531,000 as of 6/16 and as of 6/24 platelets have normalized to 207,000   Data Reviewed: Basic Metabolic Panel: Recent Labs  Lab 06/15/21 0642 06/18/21 0813  NA 132* 135  K 4.1 4.3  CL 104 105  CO2 21* 22  GLUCOSE 116* 109*  BUN 34* 29*  CREATININE 1.14* 0.93  CALCIUM 9.5 10.0  MG 1.9 1.8  PHOS 4.2 3.9   Liver Function Tests:  Recent Labs  Lab 06/15/21 0642  AST 23  ALT 15  ALKPHOS 142*  BILITOT 0.4  PROT 6.8  ALBUMIN 2.7*   No results for input(s): LIPASE, AMYLASE in the last 168 hours. No results for input(s): AMMONIA in the last 168 hours. CBC: Recent Labs  Lab 06/15/21 0642  WBC 4.6  NEUTROABS 3.5  HGB 9.0*  HCT 29.8*  MCV 82.8  PLT 207    Cardiac Enzymes: No results for input(s): CKTOTAL, CKMB, CKMBINDEX, TROPONINI in the last 168  hours. BNP (last 3 results) No results for input(s): BNP in the last 8760 hours.  ProBNP (last 3 results) No results for input(s): PROBNP in the last 8760 hours.  CBG: Recent Labs  Lab 06/19/21 1634 06/19/21 1949 06/19/21 2327 06/20/21 0345 06/20/21 0746  GLUCAP 101* 112* 85 119* 114*    No results found for this or any previous visit (from the past 240 hour(s)).   Studies: CT ABDOMEN PELVIS WO CONTRAST  Result Date: 06/19/2021 CLINICAL DATA:  Severe left lower quadrant pain with loose stools, known massive fibroids with active menses. EXAM: CT ABDOMEN AND PELVIS WITHOUT CONTRAST TECHNIQUE: Multidetector CT imaging of the abdomen and pelvis was performed following the standard protocol without IV contrast. COMPARISON:  CT 5262, CT chest 06/03/2021 FINDINGS: Lower chest: Atelectatic changes and/or scarring in left lung base. Bases otherwise clear. Cardiomegaly and small pericardial effusion. Heterogeneously dense breast tissue. Mild body wall edema of the chest most pronounced left greater than right. Hepatobiliary: No visible focal liver lesion. Redemonstrated hepatomegaly. Normal liver attenuation. Smooth surface contour. Gallbladder not well visualized, largely decompressed at the time of exam. Pancreas: No pancreatic ductal dilatation or surrounding inflammatory changes. Spleen: Normal in size. No concerning splenic lesions. Adrenals/Urinary Tract: No discernible focal adrenal nodules or masses. Bilateral cortical scarring. Nonobstructing calculi present in both kidneys as well as additional vascular calcifications. No discernible obstructive urolithiasis or hydronephrosis with multiple calcifications in the deep pelvis appearing stable from prior likely reflecting phleboliths. Mild bladder wall thickening and perivesicular hazy stranding is noted. Stomach/Bowel: Challenging assessment of the bowel due to significant displacement and compression of numerous bowel loops due to the large  uterine masses as detailed below. Feeding tube tip terminates at the level of the pylorus/duodenal bulb. Distal esophagus and stomach are otherwise unremarkable. Duodenum with a normal sweep across the midline abdomen. High attenuation enteric contrast media has traversed to the level of the colon. No discernible small bowel thickening. Questionable thickening and edematous changes centered upon the rectosigmoid. A focal culprit diverticulum is difficult to identify. Appendix is poorly demonstrated. No convincing evidence of bowel obstruction. Vascular/Lymphatic: Suboptimal assessment lymph nodes given absence of contrast media and the large displaced mass effect of the uterine masses. No clearly enlarged abdominopelvic adenopathy. Atherosclerotic calcifications throughout the abdominal aorta and branch vessels. Reproductive: Massive enlargement of uterus which could reflect a conglomerate of multiple leiomyomas given the heterogeneous on the lobular appearance versus is a an irregularly degenerating solitary fibroid, conglomerate size measures approximately 21.6 x 15.2 by 22.7 cm in size, not significantly changed accounting for differences in measurement. Poor visualization of the ovaries. Poor visualization of the cervix. Other: Low-attenuation free fluid is distributed throughout the abdomen and pelvis. Diffuse body wall edema. No free air. No bowel containing hernia. Musculoskeletal: No acute osseous abnormality or suspicious osseous lesion. Multilevel degenerative changes are present in the imaged portions of the spine. IMPRESSION: 1. Large abdominopelvic mass, presumably arising from the uterus, could reflect leiomyoma or  leiomyosarcoma, incompletely characterized on this exam. Overall size and appearance not significantly changed from 05/17/2021 exam. This results in marked displacing mass effect of the abdominal viscera and complicates the evaluation of the bowel and mesentery. Further limitations by the  absence of contrast media. 2. There is some likely circumferential thickening about the rectosigmoid though a focal culprit diverticulum is not well visualized. Could reflect a proctocolitis versus diverticulitis given visualization of few scattered diverticula in the better visualized portions of bowel. 3. Small to moderate volume simple attenuation free fluid throughout the abdomen and pelvis. 4. Transesophageal tube tip terminates at the gastric antrum/duodenal bulb. 5. Nonobstructing nephrolithiasis and bilateral renal vascular calcification. 6. Cardiomegaly.  Pericardial effusion. 7.  Aortic Atherosclerosis (ICD10-I70.0). Electronically Signed   By: Lovena Le M.D.   On: 06/19/2021 20:04   DG Abd Portable 1V  Result Date: 06/18/2021 CLINICAL DATA:  Abdominal distension EXAM: PORTABLE ABDOMEN - 1 VIEW COMPARISON:  05/30/2021, CT from 05/17/2021 FINDINGS: Feeding catheter is noted in the distal aspect of the stomach. Scattered large and small bowel gas is noted. No obstructive changes are noted. Large soft tissue density is noted in the pelvis similar to that seen on prior CT examination consistent with uterine fibroid change. IMPRESSION: Stable soft tissue mass in the pelvis similar to that seen on prior CT examination. No acute abnormality noted. Electronically Signed   By: Inez Catalina M.D.   On: 06/18/2021 10:16    Scheduled Meds:  amiodarone  200 mg Per Tube Daily   amLODipine  10 mg Per Tube Daily   aspirin  81 mg Per Tube Daily   atorvastatin  40 mg Per Tube Daily   carvedilol  25 mg Per Tube BID WC   chlorhexidine gluconate (MEDLINE KIT)  15 mL Mouth Rinse BID   Chlorhexidine Gluconate Cloth  6 each Topical Daily   cloNIDine  0.1 mg Per Tube TID   feeding supplement (JEVITY 1.5 CAL/FIBER)  900 mL Per Tube Q24H   feeding supplement (PROSource TF)  45 mL Per Tube Daily   ferrous sulfate  300 mg Per Tube BID WC   free water  100 mL Per Tube Q6H   hydrALAZINE  100 mg Per Tube Q8H    hydrocortisone-pramoxine  1 applicator Rectal BID   isosorbide dinitrate  40 mg Per Tube TID   ketorolac  15 mg Intravenous Q8H   lactulose  10 g Per Tube BID   mouth rinse  15 mL Mouth Rinse 10 times per day   megestrol  80 mg Per Tube TID   multivitamin with minerals  1 tablet Per Tube Daily   pantoprazole sodium  40 mg Per Tube Daily   sacubitril-valsartan  1 tablet Per Tube BID   sodium chloride flush  10-40 mL Intracatheter Q12H   Continuous Infusions:  sodium chloride 75 mL/hr at 06/20/21 0548   ciprofloxacin     metronidazole      Principal Problem:   Cerebrovascular accident (CVA) ([place]) Active Problems:   Acute respiratory failure with hypoxia ([facility])   Fibroid   Iron deficiency anemia due to chronic blood loss   Cerebral edema ([facility])   Chronic HFrEF (heart failure with reduced ejection fraction) ([place])   Pneumonia due to methicillin susceptible Staphylococcus aureus ([facility])   Abdominal distention   Encounter for feeding tube placement   Encounter for intubation   Ileus ([place])   Staphylococcus epidermidis bacteremia   Status post tracheostomy ([place])   Abnormal uterine bleeding (AUB)  Hemorrhagic stroke ([place])   Hemiparesis ([place])   Constipation   Uterine fibroid   Left hemiparesis [facility])   Consultants: PCCM Palliative medicine OB-GYN CIR General surgery Infectious disease  Procedures: Echocardiogram IR transcatheter embolization on 5/24 EEG IR placement of tracheostomy on 6/3  Antibiotics: Cefazolin 5/30 through 6/5   Time spent: 45 minutes    Erin Hearing ANP  [facility] 7 am - 330 pm/M-F for direct patient care and secure chat Please refer to Amion for contact info 36  days

## 2021-06-21 ENCOUNTER — Inpatient Hospital Stay (HOSPITAL_COMMUNITY): Payer: Medicaid Other

## 2021-06-21 DIAGNOSIS — K5732 Diverticulitis of large intestine without perforation or abscess without bleeding: Secondary | ICD-10-CM

## 2021-06-21 DIAGNOSIS — I619 Nontraumatic intracerebral hemorrhage, unspecified: Secondary | ICD-10-CM

## 2021-06-21 DIAGNOSIS — K529 Noninfective gastroenteritis and colitis, unspecified: Secondary | ICD-10-CM

## 2021-06-21 LAB — CBC WITH DIFFERENTIAL/PLATELET
Abs Immature Granulocytes: 0.01 10*3/uL (ref 0.00–0.07)
Basophils Absolute: 0 10*3/uL (ref 0.0–0.1)
Basophils Relative: 0 %
Eosinophils Absolute: 0 10*3/uL (ref 0.0–0.5)
Eosinophils Relative: 0 %
HCT: 28.2 % — ABNORMAL LOW (ref 36.0–46.0)
Hemoglobin: 8.7 g/dL — ABNORMAL LOW (ref 12.0–15.0)
Immature Granulocytes: 0 %
Lymphocytes Relative: 11 %
Lymphs Abs: 0.5 10*3/uL — ABNORMAL LOW (ref 0.7–4.0)
MCH: 25.7 pg — ABNORMAL LOW (ref 26.0–34.0)
MCHC: 30.9 g/dL (ref 30.0–36.0)
MCV: 83.2 fL (ref 80.0–100.0)
Monocytes Absolute: 0.6 10*3/uL (ref 0.1–1.0)
Monocytes Relative: 13 %
Neutro Abs: 3.5 10*3/uL (ref 1.7–7.7)
Neutrophils Relative %: 76 %
Platelets: 181 10*3/uL (ref 150–400)
RBC: 3.39 MIL/uL — ABNORMAL LOW (ref 3.87–5.11)
WBC: 4.7 10*3/uL (ref 4.0–10.5)
nRBC: 0 % (ref 0.0–0.2)

## 2021-06-21 LAB — GLUCOSE, CAPILLARY
Glucose-Capillary: 113 mg/dL — ABNORMAL HIGH (ref 70–99)
Glucose-Capillary: 100 mg/dL — ABNORMAL HIGH (ref 70–99)
Glucose-Capillary: 89 mg/dL (ref 70–99)
Glucose-Capillary: 95 mg/dL (ref 70–99)
Glucose-Capillary: 98 mg/dL (ref 70–99)

## 2021-06-21 MED ORDER — ADULT MULTIVITAMIN W/MINERALS CH
1.0000 | ORAL_TABLET | Freq: Every day | ORAL | Status: DC
  Administered 2021-06-22 – 2021-07-03 (×12): 1 via ORAL
  Filled 2021-06-21 (×12): qty 1

## 2021-06-21 MED ORDER — SIMETHICONE 40 MG/0.6ML PO SUSP
40.0000 mg | Freq: Four times a day (QID) | ORAL | Status: DC | PRN
  Filled 2021-06-21: qty 0.6

## 2021-06-21 MED ORDER — ISOSORBIDE DINITRATE 20 MG PO TABS
40.0000 mg | ORAL_TABLET | Freq: Three times a day (TID) | ORAL | Status: DC
  Administered 2021-06-21 – 2021-07-03 (×38): 40 mg via ORAL
  Filled 2021-06-21 (×39): qty 2

## 2021-06-21 MED ORDER — SACUBITRIL-VALSARTAN 97-103 MG PO TABS
1.0000 | ORAL_TABLET | Freq: Two times a day (BID) | ORAL | Status: DC
  Administered 2021-06-21 – 2021-07-03 (×25): 1 via ORAL
  Filled 2021-06-21 (×25): qty 1

## 2021-06-21 MED ORDER — ATORVASTATIN CALCIUM 40 MG PO TABS
40.0000 mg | ORAL_TABLET | Freq: Every day | ORAL | Status: DC
  Administered 2021-06-22 – 2021-07-03 (×12): 40 mg via ORAL
  Filled 2021-06-21 (×12): qty 1

## 2021-06-21 MED ORDER — ENSURE ENLIVE PO LIQD
237.0000 mL | Freq: Three times a day (TID) | ORAL | Status: DC
  Administered 2021-06-22 – 2021-07-03 (×33): 237 mL via ORAL
  Filled 2021-06-21 (×4): qty 237

## 2021-06-21 MED ORDER — HYDRALAZINE HCL 50 MG PO TABS
100.0000 mg | ORAL_TABLET | Freq: Three times a day (TID) | ORAL | Status: DC
  Administered 2021-06-21 – 2021-07-03 (×38): 100 mg via ORAL
  Filled 2021-06-21 (×38): qty 2

## 2021-06-21 MED ORDER — MEGESTROL ACETATE 40 MG PO TABS
80.0000 mg | ORAL_TABLET | Freq: Three times a day (TID) | ORAL | Status: DC
  Administered 2021-06-21 – 2021-07-03 (×38): 80 mg via ORAL
  Filled 2021-06-21 (×38): qty 2

## 2021-06-21 MED ORDER — AMLODIPINE BESYLATE 10 MG PO TABS
10.0000 mg | ORAL_TABLET | Freq: Every day | ORAL | Status: DC
  Administered 2021-06-22 – 2021-07-03 (×12): 10 mg via ORAL
  Filled 2021-06-21 (×2): qty 1
  Filled 2021-06-21: qty 2
  Filled 2021-06-21 (×9): qty 1

## 2021-06-21 MED ORDER — CARVEDILOL 12.5 MG PO TABS
25.0000 mg | ORAL_TABLET | Freq: Two times a day (BID) | ORAL | Status: DC
  Administered 2021-06-21 – 2021-07-03 (×25): 25 mg via ORAL
  Filled 2021-06-21 (×25): qty 2

## 2021-06-21 MED ORDER — CLONIDINE HCL 0.1 MG PO TABS
0.1000 mg | ORAL_TABLET | Freq: Three times a day (TID) | ORAL | Status: DC
  Administered 2021-06-21 – 2021-07-03 (×38): 0.1 mg via ORAL
  Filled 2021-06-21 (×38): qty 1

## 2021-06-21 MED ORDER — FERROUS SULFATE 325 (65 FE) MG PO TABS
325.0000 mg | ORAL_TABLET | Freq: Two times a day (BID) | ORAL | Status: DC
  Administered 2021-06-21 – 2021-07-03 (×25): 325 mg via ORAL
  Filled 2021-06-21 (×25): qty 1

## 2021-06-21 MED ORDER — LACTULOSE 10 GM/15ML PO SOLN
10.0000 g | Freq: Two times a day (BID) | ORAL | Status: DC
  Administered 2021-06-21 – 2021-07-03 (×25): 10 g via ORAL
  Filled 2021-06-21 (×25): qty 30

## 2021-06-21 MED ORDER — AMIODARONE HCL 200 MG PO TABS
200.0000 mg | ORAL_TABLET | Freq: Every day | ORAL | Status: DC
  Administered 2021-06-22 – 2021-07-03 (×12): 200 mg via ORAL
  Filled 2021-06-21 (×12): qty 1

## 2021-06-21 MED ORDER — PANTOPRAZOLE SODIUM 40 MG PO TBEC
40.0000 mg | DELAYED_RELEASE_TABLET | Freq: Every day | ORAL | Status: DC
  Administered 2021-06-22 – 2021-07-03 (×12): 40 mg via ORAL
  Filled 2021-06-21 (×12): qty 1

## 2021-06-21 MED ORDER — ASPIRIN 81 MG PO CHEW
81.0000 mg | CHEWABLE_TABLET | Freq: Every day | ORAL | Status: DC
  Administered 2021-06-22 – 2021-07-03 (×12): 81 mg via ORAL
  Filled 2021-06-21 (×12): qty 1

## 2021-06-21 MED ORDER — TRAMADOL HCL 50 MG PO TABS
50.0000 mg | ORAL_TABLET | Freq: Four times a day (QID) | ORAL | Status: DC | PRN
  Administered 2021-06-24 – 2021-06-28 (×5): 50 mg via ORAL
  Filled 2021-06-21 (×5): qty 1

## 2021-06-21 NOTE — Plan of Care (Signed)
  Problem: Education: Goal: Knowledge of disease or condition will improve Outcome: Progressing Goal: Knowledge of secondary prevention will improve Outcome: Progressing   

## 2021-06-21 NOTE — Progress Notes (Signed)
Inpatient Rehabilitation Admissions Coordinator   Inpatient rehab consult received for reevaluation and questions from pt's brother. I contacted her brother by phone. We again discussed goals, ELOS, expectations of caregiver supports, etc for CIR admits. Brother expresses frustrations of the [place] medicaid application process, his need to become her POA, his limitations in providing physical care of her as her caregiver, and his desire not to place her at [facility]. Patient is not a candidate for CIR admit for she lacks the caregiver supports available at home that she is projected to need. Brother is not able to provide 24/7 physical as her caregiver. Please call me with any questions.  Danne Baxter, RN, MSN Rehab Admissions Coordinator [phone] 06/21/2021 12:09 PM

## 2021-06-21 NOTE — Progress Notes (Addendum)
Physical Therapy Treatment Patient Details Name: Holly Bradley MRN: [id number] DOB: 1976/03/05 Today's Date: 06/21/2021    History of Present Illness 45 yo female s/p mechanical thrombectomy on 5/24 of right MCA M1/M2/M3 and embolization of right MCA M3 branch due to contrast extravasation. Pt re-intubated 5/26. Repeat head CT on 5/26 showed increased cytotoxic edema and mildly increased leftward midline shift, now 5 mm, decreased SAH and IVH. Abdominal CT showing 22.9 cm mass, possibly consistent with uterine fibroids, no lymphadenopathy. s/p trach 6/3, decannulated 6/26. PMH fibriods HTN HF paranoid psychosis recent hospitalizations january and february 2022.  Trach removed 6/30.    PT Comments    Pt in good spirits, smiling and laughing appropriately during session. Pt requiring mod-max assist for bed mobility today, demonstrating good initiation and participation with trunk and RUE/LE. Pt continues to present with L inattention, ubt attends with less cuing at this time. Pt with improving postural awareness, preference for leaning L but when cued verbally to "fix your posture" pt able to correct posture to midline. Pt stood x2 with mod +2, and took pivotal steps to recliner today without LLE buckling and with good tolerance. Per SLP, pt's brother confirms support at d/c from acute setting. CIR denied again after reconsulted, plan remains SNF. Would be an excellent candidate for more intensive therapies.     Follow Up Recommendations  SNF     Equipment Recommendations  Wheelchair (measurements PT);Wheelchair cushion (measurements PT);Hospital bed;Other (comment) (hoyer lift)    Recommendations for Other Services Rehab consult     Precautions / Restrictions Precautions Precautions: Fall;Other (comment) Precaution Comments: L hemiparesis, PRAFO,  resting hand splint, sublux to L shoulder Required Braces or Orthoses: Other Brace Other Brace: L resting hand splint (6 hours on/6 hours  off) Restrictions Weight Bearing Restrictions: No    Mobility  Bed Mobility Overal bed mobility: Needs Assistance Bed Mobility: Rolling;Supine to Sit Rolling: Mod assist   Supine to sit: Max assist;+2 for physical assistance Sit to supine: +2 for physical assistance;Max assist   General bed mobility comments: mod assist for roll towards L with mod verbal and tactile cuing to reach for L railing with RUE, R knee flexion. Max +2 for sidelying to sit for trunk and LE management, cues for pushing through RUE on bedrail to assist.    Transfers Overall transfer level: Needs assistance   Transfers: Sit to/from Stand;Stand Pivot Transfers Sit to Stand: Mod assist;+2 physical assistance;+2 safety/equipment Stand pivot transfers: Max assist;+2 physical assistance;+2 safety/equipment       General transfer comment: Mod +2 for power up, rise, steady, and LUE approximation given shoulder sublux as well as LLE guarding. Pt able to take pivotal steps towards recliner  Ambulation/Gait                 Stairs             Wheelchair Mobility    Modified Rankin (Stroke Patients Only)       Balance Overall balance assessment: Needs assistance Sitting-balance support: Single extremity supported;Feet supported Sitting balance-Leahy Scale: Poor   Postural control: Posterior lean;Left lateral lean Standing balance support: Bilateral upper extremity supported;During functional activity Standing balance-Leahy Scale: Poor Standing balance comment: relies on external support                            Cognition Arousal/Alertness: Awake/alert Behavior During Therapy: WFL for tasks assessed/performed Overall Cognitive Status: Difficult to assess Area of Impairment: Following  commands;Attention;Safety/judgement;Problem solving;Awareness;Memory                   Current Attention Level: Selective   Following Commands: Follows one step commands with increased  time Safety/Judgement: Decreased awareness of safety Awareness: Emergent Problem Solving: Slow processing;Requires verbal cues;Requires tactile cues;Difficulty sequencing;Decreased initiation General Comments: requires repeated cues for postural adjustment, smiling and laughing appropriately during session and consistently responds appropriately with yes/no head nods      Exercises General Exercises - Lower Extremity Long Arc Quad: AAROM;Both;10 reps;Seated Mini-Sqauts: Other (comment);Both;Seated;Standing (STS x2 from EOB, mod +2)    General Comments        Pertinent Vitals/Pain Pain Assessment: Faces Faces Pain Scale: Hurts little more Pain Location: L hand with stretch Pain Descriptors / Indicators: Tightness;Tender Pain Intervention(s): Limited activity within patient's tolerance;Monitored during session;Repositioned    Home Living                      Prior Function            PT Goals (current goals can now be found in the care plan section) Acute Rehab PT Goals Patient Stated Goal: none stated PT Goal Formulation: With patient Time For Goal Achievement: 07/02/21 Potential to Achieve Goals: Fair Progress towards PT goals: Progressing toward goals    Frequency    Min 3X/week      PT Plan Current plan remains appropriate    Co-evaluation PT/OT/SLP Co-Evaluation/Treatment: Yes Reason for Co-Treatment: For patient/therapist safety;To address functional/ADL transfers;Complexity of the patient's impairments (multi-system involvement) PT goals addressed during session: Balance;Mobility/safety with mobility;Strengthening/ROM OT goals addressed during session: ADL's and self-care      AM-PAC PT "6 Clicks" Mobility   Outcome Measure  Help needed turning from your back to your side while in a flat bed without using bedrails?: A Lot Help needed moving from lying on your back to sitting on the side of a flat bed without using bedrails?: A Lot Help needed  moving to and from a bed to a chair (including a wheelchair)?: Total Help needed standing up from a chair using your arms (e.g., wheelchair or bedside chair)?: A Lot Help needed to walk in hospital room?: Total Help needed climbing 3-5 steps with a railing? : Total 6 Click Score: 9    End of Session Equipment Utilized During Treatment: Gait belt Activity Tolerance: Patient tolerated treatment well Patient left: with call bell/phone within reach;in chair;with chair alarm set;Other (comment) (maximove pad under pt) Nurse Communication: Mobility status;Need for lift equipment;Other (comment) (RN Danny in room at end of session) PT Visit Diagnosis: Hemiplegia and hemiparesis;Other abnormalities of gait and mobility (R26.89);Unsteadiness on feet (R26.81);Muscle weakness (generalized) (M62.81);Difficulty in walking, not elsewhere classified (R26.2);Other symptoms and signs involving the nervous system (R29.898);Apraxia (R48.2) Hemiplegia - Right/Left: Left Hemiplegia - dominant/non-dominant: Non-dominant Hemiplegia - caused by: Cerebral infarction;Nontraumatic SAH;Nontraumatic intracerebral hemorrhage     Time: 1130-1159 PT Time Calculation (min) (ACUTE ONLY): 29 min  Charges:  $Neuromuscular Re-education: 8-22 mins                     Stacie Glaze, PT DPT Acute Rehabilitation Services Pager [phone]  Office [phone]    [place] 06/21/2021, 3:19 PM

## 2021-06-21 NOTE — Progress Notes (Signed)
Occupational Therapy Treatment Patient Details Name: Holly Bradley MRN: [id number] DOB: 1976-12-12 Today's Date: 06/21/2021    History of present illness 45 yo female s/p mechanical thrombectomy on 5/24 of right MCA M1/M2/M3 and embolization of right MCA M3 branch due to contrast extravasation. Pt re-intubated 5/26. Repeat head CT on 5/26 showed increased cytotoxic edema and mildly increased leftward midline shift, now 5 mm, decreased SAH and IVH. Abdominal CT showing 22.9 cm mass, possibly consistent with uterine fibroids, no lymphadenopathy. s/p trach 6/3. PMH fibriods HTN HF paranoid psychosis recent hospitalizations january and february 2022.  Trach removed 6/30.   OT comments  Patient with nice progress toward patient focused OT goals.  Patient seen as co-treat with PT.  Patient with improved rolling to the L with Mod A, Max A +2 for side lying to sit.  Up to Max A for sitting balance, but patient able to support herself with hand hold to L for short instances.  Patient able to stand times two with +2 and L knee blocked for eventual side steps to recliner.  With increasing mobility and participation, CIR is recommended to reassess the patient.  Her brother is considering taking her home if she continues to progress.  OT to continue efforts in the acute setting to maximize functional status.  L resting hand splint applied.   Follow Up Recommendations  CIR    Equipment Recommendations  Wheelchair (measurements OT);Wheelchair cushion (measurements OT);Hospital bed;3 in 1 bedside commode    Recommendations for Other Services Rehab consult    Precautions / Restrictions Precautions Precautions: Fall;Other (comment) Precaution Comments: L hemiparesis, PRAFO,  resting hand splint, sublux to L shoulder Required Braces or Orthoses: Other Brace Other Brace: L resting hand splint (6 hours on/6 hours off) Restrictions Weight Bearing Restrictions: No       Mobility Bed Mobility   Bed  Mobility: Rolling;Supine to Sit Rolling: Mod assist     Sit to supine: +2 for physical assistance;Max assist     Patient Response: Cooperative;Flat affect  Transfers Overall transfer level: Needs assistance   Transfers: Sit to/from Stand;Stand Pivot Transfers Sit to Stand: Mod assist;+2 physical assistance;+2 safety/equipment Stand pivot transfers: Max assist;+2 physical assistance;+2 safety/equipment       General transfer comment: took 3 side steps to recliner with 2+ HHA and a gait belt    Balance Overall balance assessment: Needs assistance Sitting-balance support: Single extremity supported;Feet supported Sitting balance-Leahy Scale: Poor   Postural control: Posterior lean;Left lateral lean Standing balance support: Bilateral upper extremity supported;During functional activity Standing balance-Leahy Scale: Poor Standing balance comment: relies on external support                              Vision   Tracking/Visual Pursuits: Requires cues, head turns, or add eye shifts to track;Unable to hold eye position out of midline;Impaired - to be further tested in functional context   Perception                     General Comments      Pertinent Vitals/ Pain       Pain Assessment: No/denies pain Faces Pain Scale: Hurts little more Pain Location: L hand with stretch Pain Descriptors / Indicators: Tightness;Tender Pain Intervention(s): Monitored during session  Frequency  Min 2X/week        Progress Toward Goals  OT Goals(current goals can now be found in the care plan section)  Progress towards OT goals: Progressing toward goals  Acute Rehab OT Goals Patient Stated Goal: none stated OT Goal Formulation: Patient unable to participate in goal setting Time For Goal Achievement: 06/25/21 Potential to Achieve Goals: [place] Discharge plan needs to be  updated;Frequency remains appropriate    Co-evaluation    PT/OT/SLP Co-Evaluation/Treatment: Yes Reason for Co-Treatment: Complexity of the patient's impairments (multi-system involvement);For patient/therapist safety   OT goals addressed during session: ADL's and self-care      AM-PAC OT "6 Clicks" Daily Activity     Outcome Measure   Help from another person eating meals?: A Little Help from another person taking care of personal grooming?: A Little Help from another person toileting, which includes using toliet, bedpan, or urinal?: A Lot Help from another person bathing (including washing, rinsing, drying)?: A Lot Help from another person to put on and taking off regular upper body clothing?: A Lot Help from another person to put on and taking off regular lower body clothing?: Total 6 Click Score: 13    End of Session Equipment Utilized During Treatment: Gait belt  OT Visit Diagnosis: Unsteadiness on feet (R26.81);Muscle weakness (generalized) (M62.81);Hemiplegia and hemiparesis Hemiplegia - Right/Left: Left Hemiplegia - dominant/non-dominant: Non-Dominant Hemiplegia - caused by: Cerebral infarction   Activity Tolerance Patient tolerated treatment well   Patient Left in chair;with call bell/phone within reach;with chair alarm set   Nurse Communication Mobility status        Time: 1130-1200 OT Time Calculation (min): 30 min  Charges: OT General Charges $OT Visit: 1 Visit OT Treatments $Therapeutic Activity: 8-22 mins  06/21/2021  Rich, OTR/L  Acute Rehabilitation Services  Office:  [phone]    Metta Clines 06/21/2021, 12:20 PM

## 2021-06-21 NOTE — Progress Notes (Addendum)
Nutrition Follow-up  DOCUMENTATION CODES:   Not applicable  INTERVENTION:  -Ensure Enlive po TID, each supplement provides 350 kcal and 20 grams of protein -Magic cup TID with meals, each supplement provides 290 kcal and 9 grams of protein  NUTRITION DIAGNOSIS:   Inadequate oral intake related to inability to eat as evidenced by NPO status.  Progressing, pt on dysphagia 3 diet with thin liquids   GOAL:   Patient will meet greater than or equal to 90% of their needs  progressing  MONITOR:   TF tolerance, Labs  REASON FOR ASSESSMENT:   Consult Enteral/tube feeding initiation and management  ASSESSMENT:   Pt with PMH of uncontrolled HTN, tobacco abuse, uterine fibroids, CHF, paroxysmal SVT, and paranoid psychosis admitted with significant encephalopathy and L sided weakness. 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  5/25 pt failed swallow eval; cortrak placed with tip gastric 5/26 pt intubated 5/30 positive for staph Epi. 6/3 s/p trach placement 6/4 tolerating PSV/CPAP 6/5 WBC up to 17, last day of ancef for staph PNA, trying ATC 6/9 ID consulted for staph epi bacteremia, felt contamination and stopped abx 6/12 fever spikes, origin unknown 6/13 change to cuffless trach 6/20 SLP recommends NPO 6/24 downsize to #4 cuffless trach, capping trials initiated 6/26 pt decannulated 6/30 diet advanced to dysphagia 3 thin liquids s/p MBS, Cortrak removed  Pt medically stable for d/c per NP.   Pt out of room at time of RD visit, having MBS. Discussed pt with RN and NT who both report pt has good appetite/intake when being fed/encouraged by staff. Last 8 meal completions charted as 15-90% (52% average meal intake). Note diet advanced to dysphagia 3 with thin liquids today and pt now with feeding assistance and made inappropriate for room service. Also note order for discontinuing Cortrak and TF orders have already been  discontinued. Will order oral nutrition supplements to provide additional kcals/protein.   UOP: 486ml + 2x unmeasured occurrences x24 hours Stool: 2x unmeasured occurrences x24 hours  Medications: ferrous sulfate, chronulac, megace, mvi, protonix Labs reviewed  CBGs 100-89-95  Diet Order:   Diet Order             DIET DYS 3 Room service appropriate? No; Fluid consistency: Thin  Diet effective now                   EDUCATION NEEDS:   No education needs have been identified at this time  Skin:  Skin Assessment: Reviewed RN Assessment  Last BM:  6/29  Height:   Ht Readings from Last 1 Encounters:  05/29/21 5\' 5"  (1.651 m)    Weight:   Wt Readings from Last 1 Encounters:  06/21/21 43.5 kg   BMI:  Body mass index is 15.96 kg/m.  Estimated Nutritional Needs:   Kcal:  1850-2100  Protein:  90-105 grams  Fluid:  >1.8 L/day    Larkin Ina, MS, RD, LDN (she/her/hers) RD pager number and weekend/on-call pager number located in [place].

## 2021-06-21 NOTE — Progress Notes (Signed)
  Speech Language Pathology Treatment: Dysphagia  Patient Details Name: Holly Bradley MRN: [id number] DOB: 22-Sep-1976 Today's Date: 06/21/2021 Time: 1210-1230 SLP Time Calculation (min) (ACUTE ONLY): 20 min  Assessment / Plan / Recommendation Clinical Impression  F/u after MBS.  Cortrak removed by RN. Pt smiling, talking with encouragement.  She needed initial verbal reminder to take very small sips of thin liquid, then was able to carry that out when drinking thin coffee with no further cues needed.  Self fed with left hand with good self-regulation and caution with hot liquids.  Verbal cues needed to continue to use her voice- deficits in initiation are primary obstacle, however once she gets some momentum verbally, she continues to talk.  Pt has made excellent gains this week and is appropriate for CIR reassessment.  D/W team.  SLP will follow.   HPI HPI: 45 yo female s/p mechanical thrombectomy on 5/24 of right MCA M1/M2/M3 and embolization of right MCA M3 branch due to contrast extravasation. Pt re-intubated 5/26. Repeat head CT on 5/26 showed increased cytotoxic edema and mildly increased leftward midline shift, now 5 mm, decreased SAH and IVH. Abdominal CT showing 22.9 cm mass, possibly consistent with uterine fibroids, no lymphadenopathy. s/p trach 6/3. MBS 6/21 and started on diet. Decannulated 6/26. PMH fibroids HTN HF paranoid psychosis recent hospitalizations january and february 2022.      SLP Plan  Continue with current plan of care       Recommendations  Diet recommendations: Dysphagia 3 (mechanical soft);Thin liquid Liquids provided via: Cup;Straw Medication Administration: Whole meds with puree Supervision: Full supervision/cueing for compensatory strategies;Patient able to self feed Compensations: Small sips/bites Postural Changes and/or Swallow Maneuvers: Seated upright 90 degrees                General recommendations: Rehab consult Oral Care  Recommendations: Oral care BID Follow up Recommendations: Inpatient Rehab SLP Visit Diagnosis: Dysphagia, oropharyngeal phase (R13.12) Plan: Continue with current plan of care       GO               Holly Bradley L. [name], [place] Office number [phone] Pager [phone]  [name] 06/21/2021, 2:36 PM

## 2021-06-21 NOTE — Progress Notes (Signed)
Modified Barium Swallow Progress Note  Patient Details  Name: Holly Bradley MRN: [id number] Date of Birth: 1976/10/04  Today's Date: 06/21/2021  Modified Barium Swallow completed.  Full report located under Chart Review in the Imaging Section.  Brief recommendations include the following:  Clinical Impression  Pt presents with improving swallow function with mild dysphagia marked by focal CN deficits V and VII on left that led to decreased bolus cohesion and some left lateral sulcus pocketing/residue.  There was excellent pharyngeal strength and NO residue post-swallow, of any consistency. There were occasional instances of very trace aspiration of thin liquids - this occurred when thin liquids were consumed in large, successive boluses and a slight amount entered the larynx PRIOR to closure of the vestibule.  There was a cough response. Laryngeal vestibule closure was complete.  Aspiration, when it occurred, was related to timing.  Pt demonstrated improved function when she took small, controlled sips of thin liquid.  Recommend advancing diet to dysphagia 3, thin liquids (small sips); give meds whole in puree. Pt was very pleased with outcome!   Swallow Evaluation Recommendations       SLP Diet Recommendations: Dysphagia 3 (Mech soft) solids;Thin liquid   Liquid Administration via: Cup;Straw   Medication Administration: Whole meds with puree   Supervision: Full supervision/cueing for compensatory strategies   Compensations: Slow rate;Small sips/bites;Monitor for anterior loss       Oral Care Recommendations: Oral care BID      Huntington Leverich L. [name], [place] Office number [phone] Pager [phone]   Holly Bradley 06/21/2021,12:02 PM

## 2021-06-21 NOTE — Progress Notes (Signed)
[facility] PROGRESS NOTE  Holly Bradley [id number]:[id number] DOB: [birth year] DOA: 05/15/2021 PCP: Mitzi Hansen, MD  Status: Remains inpatient appropriate because:Altered mental status, Unsafe d/c plan, and Inpatient level of care appropriate due to severity of illness  Dispo: The patient is from: Home              Anticipated d/c is to: Home with brother with home health              Patient currently is medically stable to d/c.   Difficult to place patient Yes   Level of care: Med-Surg  Code Status: Full code except for no CPR Family Communication: Patient only DVT prophylaxis: SCDs with history of hemorrhagic stroke COVID vaccination status: Unknown    HPI: 45 y.o. female past medical history of essential hypertension, diabetes mellitus type 2 large uterine fibroids paroxysmal SVT, chronic diastolic heart failure paranoid psychosis comes into the ED on 05/16/2019 with confusion was found to have a right MCA occlusion underwent thrombectomy which was complicated by contrast extravasation and M3 coiling embolization and revascularization.  She subsequently developed subarachnoid hemorrhage repeat imaging revealed right basal ganglia infarct with a 3 mm left shift, she was treated with hypertonic saline in the ICU, on 05/17/2021 she developed respiratory distress and he was intubated CT of the head showed increased cytotoxic edema and left side shift.  No mental status improvement is now trach and with nasal collar track for tube feedings.  She has been treated with Ancef and completed a 7-day course for MSSA pneumonia, her blood cultures on 05/21/2021 were positive for staph epi.  Infectious disease was consulted and are on board she has remained off antibiotics since 2022 repeated blood cultures on 06/02/2021 have remained negative.  5/24 Admit to neurology after ED visit with AMS, found to have R MCA stroke, underwent mechanical thrombectomy, M3 coil embolization, subsequent SAH 5/26  decreased mental status and respiratory distress, intubated 6/1 still not candidate for extubation with poor mentation and cough mechanics. Family unsure if pt would want trach. Staph epi bacteremia, continues on ancef 6/2 moving RUE RLE following some commands. decision to move forward with tracheostomy.   6/3 perc trach. Slight WBC increase but no fever, continues on ancef 6/4 Tolerating PSV/CPAP 6/5 WBC up to 17, last day of ancef for staph pna. Trying ATC  6/9 ID consulted for staph epi bacteremia . Felt contamination and stopped abx. 6/12 had fever spikes but nothing to explain  6/13 change to cuffless trach  6/24 downsized to 4.0 cuffless trach and capping trials initiate 5/26 decannulated successfully  Subjective: Patient sleeping upon my entry into the room and I did not awaken her.  Subsequently she has gone for MBSS with recommendation from SLP to advance to a dysphagia 3 diet and remove core track tube.  Objective: Vitals:   06/21/21 0546 06/21/21 0805  BP: 138/77 138/63  Pulse: 85 87  Resp:  18  Temp:  (!) 97.5 F (36.4 C)  SpO2:  100%    Intake/Output Summary (Last 24 hours) at 06/21/2021 0808 Last data filed at 06/21/2021 4481 Gross per 24 hour  Intake 2994.08 ml  Output 400 ml  Net 2594.08 ml    Filed Weights   06/19/21 0500 06/20/21 0332 06/21/21 0307  Weight: 43.9 kg 40.8 kg 43.5 kg    Exam:  Constitutional: Sleeping soundly, did not awaken, appears comfortable Respiratory: Lungs are clear to auscultation.  Previous trach site healed with eschar.  No increased work of  breathing and she remained stable on room air. Cardiovascular: S1-S2, no peripheral edema, pulse regular; somewhat hypertensive blood pressure readings intermittently Abdomen:  LBM 6/29, essentially soft around fibroid filled uterus, normoactive bowel sounds Neurologic: Sleeping but at baseline CN 2-12 grossly intact except for left facial droop when smiling.    Psychiatric: Sleeping but at  baseline alert and oriented x3.  Pleasant affect.  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 Continue aspirin and Lipitor. Cont PT/OT; OOB to chair TID Brother would like to take patient home but given her level of care he is uncertain that he does not wish to place her in an SNF even if it is for rehabilitation only.  Discussed with SLP and we will see if her condition and her discharge circumstances will be appropriate for CIR  Cytotoxic cerebral edema/hypernatremia Initially treated with 3% saline  Sodium normalized with most recent reading 137 on 6/15 Continue lower volume free water per tube  Left lower quadrant abdominal pain/suspected diverticulitis and proctocolitis T-max 100.8 w/ resolution of pain 6/29.  CT abdomen pelvis: Radiologist questions possible diverticulitis as well as proctocolitis. Continue Cipro and Flagyl IV x7 days Continue Proctofoam per rectum x7 days Continue scheduled IV Toradol and per tube Oxy IR as needed for pain  Chronic constipation Cont Chronulac 10 g BID Discontinue IV fluid hydration   Hypertension/grade 2 diastolic dysfunction Echocardiogram this admission reveals preserved LVEF with grade 2 diastolic dysfunction Continue carvedilol, hydralazine, Isordil, Entresto, Catapres and Norvasc  Dysphagia due to recent stroke MBSS repeated 6/30 with recommendation for dysphagia 3 diet and thin liquids SLP request discontinuation of core track tube-has been completed   Acute respiratory failure with hypoxia status post tracheostomy/MSSA pneumonia Tolerated capping trials and was decannulated on 6/26 Completed 7 days of  cefazolin   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   Currently experiencing menses Will follow daily CBC -as of 6/29 hemoglobin has dropped from 9.0 to 8.1 but as of 6/30 up to 8.7  Protein calorie malnutrition Body mass index is 15.96 kg/m.  Continue dysphagia 1 diet with nectar thick liquids Continue nocturnal tube feedings with Jevity and Prosource per tube  History of paroxysmal SVT: Continue home amiodarone dose.  Acute respiratory failure with hypoxia status post tracheostomy/MSSA pneumonia Tolerated capping trials and was decannulated on 6/26 Completed 7 days of cefazolin  Fever of unknown source/resolved: Blood cultures on 5/30 with staph epidermis to have hominis-both likely contaminants Repeat blood cultures on 6/6 with staph epidermis Repeat blood cultures on 6/11 negative No fever for the past 9 days  Thrombocytosis: Acting as an acute phase reactant with platelets trending downward 531,000 as of 6/16 and as of 6/24 platelets have normalized to 207,000   Data Reviewed: Basic Metabolic Panel: Recent Labs  Lab 06/15/21 0642 06/18/21 0813  NA 132* 135  K 4.1 4.3  CL 104 105  CO2 21* 22  GLUCOSE 116* 109*  BUN 34* 29*  CREATININE 1.14* 0.93  CALCIUM 9.5 10.0  MG 1.9 1.8  PHOS 4.2 3.9   Liver Function Tests: Recent Labs  Lab 06/15/21 0642  AST 23  ALT 15  ALKPHOS 142*  BILITOT 0.4  PROT 6.8  ALBUMIN 2.7*   No results for input(s): LIPASE, AMYLASE in the last 168 hours. No results for input(s): AMMONIA in the last 168 hours. CBC: Recent Labs  Lab 06/15/21 0642 06/20/21 0414 06/21/21 0426  WBC 4.6 3.7* 4.7  NEUTROABS 3.5 2.6 3.5  HGB 9.0* 8.1* 8.7*  HCT 29.8* 26.0* 28.2*  MCV 82.8 83.9 83.2  PLT 207 169 181    Cardiac Enzymes: No results for input(s): CKTOTAL, CKMB, CKMBINDEX, TROPONINI in the last 168 hours. BNP (last 3 results) No results for input(s): BNP in the last 8760 hours.  ProBNP (last 3  results) No results for input(s): PROBNP in the last 8760 hours.  CBG: Recent Labs  Lab 06/20/21 1609 06/20/21 1948 06/20/21 2356 06/21/21 0306 06/21/21 0807  GLUCAP 92 116* 107* 113* 100*    No results found for this or any previous visit (from the past 240 hour(s)).   Studies: CT ABDOMEN PELVIS WO CONTRAST  Result Date: 06/19/2021 CLINICAL DATA:  Severe left lower quadrant pain with loose stools, known massive fibroids with active menses. EXAM: CT ABDOMEN AND PELVIS WITHOUT CONTRAST TECHNIQUE: Multidetector CT imaging of the abdomen and pelvis was performed following the standard protocol without IV contrast. COMPARISON:  CT 5262, CT chest 06/03/2021 FINDINGS: Lower chest: Atelectatic changes and/or scarring in left lung base. Bases otherwise clear. Cardiomegaly and small pericardial effusion. Heterogeneously dense breast tissue. Mild body wall edema of the chest most pronounced left greater than right. Hepatobiliary: No visible focal liver lesion. Redemonstrated hepatomegaly. Normal liver attenuation. Smooth surface contour. Gallbladder not well visualized, largely decompressed at the time of exam. Pancreas: No pancreatic ductal dilatation or surrounding inflammatory changes. Spleen: Normal in size. No concerning splenic lesions. Adrenals/Urinary Tract: No discernible focal adrenal nodules or masses. Bilateral cortical scarring. Nonobstructing calculi present in both kidneys as well as additional vascular calcifications. No discernible obstructive urolithiasis or hydronephrosis with multiple calcifications in the deep pelvis appearing stable from prior likely reflecting phleboliths. Mild bladder wall thickening and perivesicular hazy stranding is noted. Stomach/Bowel: Challenging assessment of the bowel due to significant displacement and compression of numerous bowel loops due to the large uterine masses as detailed below. Feeding tube tip terminates at the level of the pylorus/duodenal bulb.  Distal esophagus and stomach are otherwise unremarkable. Duodenum with a normal sweep across the midline abdomen. High attenuation enteric contrast media has traversed to the level of the colon. No discernible small bowel thickening. Questionable thickening and edematous changes centered upon the rectosigmoid. A focal culprit diverticulum is difficult to identify. Appendix is poorly demonstrated. No convincing evidence of bowel obstruction. Vascular/Lymphatic: Suboptimal assessment lymph nodes given absence of contrast media and the large displaced mass effect of the uterine masses. No clearly enlarged abdominopelvic adenopathy. Atherosclerotic calcifications throughout the abdominal aorta and branch vessels. Reproductive: Massive enlargement of uterus which could reflect a conglomerate of multiple leiomyomas given the heterogeneous on the lobular appearance versus is a an irregularly degenerating solitary fibroid, conglomerate size measures approximately 21.6 x 15.2 by 22.7 cm in size, not significantly changed accounting for differences in measurement. Poor visualization of the ovaries. Poor visualization of the cervix. Other: Low-attenuation free fluid is distributed throughout the abdomen and pelvis. Diffuse body wall edema. No free air. No bowel containing hernia. Musculoskeletal: No acute osseous abnormality or suspicious osseous lesion. Multilevel degenerative changes are present in the imaged portions of the spine. IMPRESSION: 1. Large abdominopelvic mass, presumably arising from the uterus, could reflect leiomyoma or leiomyosarcoma, incompletely characterized on this exam.  Overall size and appearance not significantly changed from 05/17/2021 exam. This results in marked displacing mass effect of the abdominal viscera and complicates the evaluation of the bowel and mesentery. Further limitations by the absence of contrast media. 2. There is some likely circumferential thickening about the rectosigmoid though  a focal culprit diverticulum is not well visualized. Could reflect a proctocolitis versus diverticulitis given visualization of few scattered diverticula in the better visualized portions of bowel. 3. Small to moderate volume simple attenuation free fluid throughout the abdomen and pelvis. 4. Transesophageal tube tip terminates at the gastric antrum/duodenal bulb. 5. Nonobstructing nephrolithiasis and bilateral renal vascular calcification. 6. Cardiomegaly.  Pericardial effusion. 7.  Aortic Atherosclerosis (ICD10-I70.0). Electronically Signed   By: Lovena Le M.D.   On: 06/19/2021 20:04    Scheduled Meds:  amiodarone  200 mg Per Tube Daily   amLODipine  10 mg Per Tube Daily   aspirin  81 mg Per Tube Daily   atorvastatin  40 mg Per Tube Daily   carvedilol  25 mg Per Tube BID WC   chlorhexidine gluconate (MEDLINE KIT)  15 mL Mouth Rinse BID   Chlorhexidine Gluconate Cloth  6 each Topical Daily   cloNIDine  0.1 mg Per Tube TID   feeding supplement (JEVITY 1.5 CAL/FIBER)  900 mL Per Tube Q24H   feeding supplement (PROSource TF)  45 mL Per Tube Daily   ferrous sulfate  300 mg Per Tube BID WC   free water  100 mL Per Tube Q6H   hydrALAZINE  100 mg Per Tube Q8H   hydrocortisone-pramoxine  1 applicator Rectal BID   isosorbide dinitrate  40 mg Per Tube TID   ketorolac  15 mg Intravenous Q8H   lactulose  10 g Per Tube BID   mouth rinse  15 mL Mouth Rinse 10 times per day   megestrol  80 mg Per Tube TID   multivitamin with minerals  1 tablet Per Tube Daily   pantoprazole sodium  40 mg Per Tube Daily   sacubitril-valsartan  1 tablet Per Tube BID   sodium chloride flush  10-40 mL Intracatheter Q12H   Continuous Infusions:  sodium chloride 75 mL/hr at 06/20/21 2019   ciprofloxacin 200 mg (06/20/21 2240)   metronidazole 500 mg (06/21/21 0339)    Principal Problem:   Cerebrovascular accident (CVA) ([place]) Active Problems:   Acute respiratory failure with hypoxia (HCC)   Fibroid   Iron  deficiency anemia due to chronic blood loss   Cerebral edema (HCC)   Chronic HFrEF (heart failure with reduced ejection fraction) ([place])   Pneumonia due to methicillin susceptible Staphylococcus aureus (HCC)   Abdominal distention   Encounter for feeding tube placement   Encounter for intubation   Ileus ([place])   Staphylococcus epidermidis bacteremia   Status post tracheostomy ([place])   Abnormal uterine bleeding (AUB)   Hemorrhagic stroke ([place])   Hemiparesis ([place])   Constipation   Uterine fibroid   Left hemiparesis ([place])   Consultants: PCCM Palliative medicine OB-GYN CIR General surgery Infectious disease  Procedures: Echocardiogram IR transcatheter embolization on 5/24 EEG IR placement of tracheostomy on 6/3  Antibiotics: Cefazolin 5/30 through 6/5   Time spent: 45 minutes    Erin Hearing ANP  [facility] 7 am - 330 pm/M-F for direct patient care and secure chat Please refer to Amion for contact info 37  days

## 2021-06-22 DIAGNOSIS — I63019 Cerebral infarction due to thrombosis of unspecified vertebral artery: Secondary | ICD-10-CM

## 2021-06-22 DIAGNOSIS — K5901 Slow transit constipation: Secondary | ICD-10-CM

## 2021-06-22 DIAGNOSIS — G8194 Hemiplegia, unspecified affecting left nondominant side: Secondary | ICD-10-CM

## 2021-06-22 LAB — CBC WITH DIFFERENTIAL/PLATELET
Abs Immature Granulocytes: 0 10*3/uL (ref 0.00–0.07)
Basophils Absolute: 0 10*3/uL (ref 0.0–0.1)
Basophils Relative: 0 %
Eosinophils Absolute: 0 10*3/uL (ref 0.0–0.5)
Eosinophils Relative: 1 %
HCT: 26.4 % — ABNORMAL LOW (ref 36.0–46.0)
Hemoglobin: 8.2 g/dL — ABNORMAL LOW (ref 12.0–15.0)
Lymphocytes Relative: 11 %
Lymphs Abs: 0.4 10*3/uL — ABNORMAL LOW (ref 0.7–4.0)
MCH: 25.9 pg — ABNORMAL LOW (ref 26.0–34.0)
MCHC: 31.1 g/dL (ref 30.0–36.0)
MCV: 83.3 fL (ref 80.0–100.0)
Monocytes Absolute: 0.2 10*3/uL (ref 0.1–1.0)
Monocytes Relative: 5 %
Neutro Abs: 3.2 10*3/uL (ref 1.7–7.7)
Neutrophils Relative %: 83 %
Platelets: 187 10*3/uL (ref 150–400)
RBC: 3.17 MIL/uL — ABNORMAL LOW (ref 3.87–5.11)
WBC: 3.8 10*3/uL — ABNORMAL LOW (ref 4.0–10.5)
nRBC: 0 % (ref 0.0–0.2)
nRBC: 0 /100 WBC

## 2021-06-22 LAB — GLUCOSE, CAPILLARY
Glucose-Capillary: 109 mg/dL — ABNORMAL HIGH (ref 70–99)
Glucose-Capillary: 113 mg/dL — ABNORMAL HIGH (ref 70–99)
Glucose-Capillary: 127 mg/dL — ABNORMAL HIGH (ref 70–99)
Glucose-Capillary: 134 mg/dL — ABNORMAL HIGH (ref 70–99)
Glucose-Capillary: 83 mg/dL (ref 70–99)
Glucose-Capillary: 90 mg/dL (ref 70–99)
Glucose-Capillary: 92 mg/dL (ref 70–99)
Glucose-Capillary: 94 mg/dL (ref 70–99)

## 2021-06-22 LAB — BASIC METABOLIC PANEL
Anion gap: 10 (ref 5–15)
BUN: 26 mg/dL — ABNORMAL HIGH (ref 6–20)
CO2: 18 mmol/L — ABNORMAL LOW (ref 22–32)
Calcium: 9.9 mg/dL (ref 8.9–10.3)
Chloride: 110 mmol/L (ref 98–111)
Creatinine, Ser: 1.11 mg/dL — ABNORMAL HIGH (ref 0.44–1.00)
GFR, Estimated: >60 mL/min (ref >60)
Glucose, Bld: 91 mg/dL (ref 70–99)
Potassium: 4.3 mmol/L (ref 3.5–5.1)
Sodium: 138 mmol/L (ref 135–145)

## 2021-06-22 MED ORDER — SODIUM CHLORIDE 0.9 % IV SOLN: INTRAVENOUS | Status: AC

## 2021-06-22 NOTE — Progress Notes (Signed)
  Speech Language Pathology Treatment: Dysphagia;Cognitive-Linquistic  Patient Details Name: Holly Bradley MRN: [id number] DOB: 20-Nov-1976 Today's Date: 06/22/2021 Time: 7741-2878 SLP Time Calculation (min) (ACUTE ONLY): 29 min  Assessment / Plan / Recommendation Clinical Impression  Holly Bradley assisted with lunch meal. Able to feed herself with RUE after set-up.  Continues to require mod verbal prompts to verbalize in response to questions. Delayed response time and decreased initiation remain present, related to communication but also motor acts.  Today she required cues to persist through meal despite the fact that she acknowledged she was hungry. At the same time, there are periods of impulsivity, during which she feeds herself rapidly or takes multiple swallows in quick succession, leading to coughing. Reviewed basic precautions -taking small sips and pacing herself.  Her intake and tolerance improves when she has assistance/prompts. D/W RN Holly Bradley continues to make good progress toward goals.   HPI HPI: 45 yo female s/p mechanical thrombectomy on 5/24 of right MCA M1/M2/M3 and embolization of right MCA M3 branch due to contrast extravasation. Pt re-intubated 5/26. Repeat head CT on 5/26 showed increased cytotoxic edema and mildly increased leftward midline shift, now 5 mm, decreased SAH and IVH. Abdominal CT showing 22.9 cm mass, possibly consistent with uterine fibroids, no lymphadenopathy. s/p trach 6/3. MBS 6/21 and started on diet. Decannulated 6/26. Second MBS 6/30, diet advanced to dys3/thin. PMH fibroids HTN HF paranoid psychosis recent hospitalizations january and february 2022.      SLP Plan  Continue with current plan of care       Recommendations  Diet recommendations: Dysphagia 3 (mechanical soft);Thin liquid Liquids provided via: Cup;Straw Medication Administration: Whole meds with puree Supervision: Patient able to self feed;Full supervision/cueing for compensatory  strategies Compensations: Small sips/bites Postural Changes and/or Swallow Maneuvers: Seated upright 90 degrees                Oral Care Recommendations: Oral care BID Follow up Recommendations: Skilled Nursing facility SLP Visit Diagnosis: Dysphagia, oropharyngeal phase (R13.12) Plan: Continue with current plan of care       GO               Holly Bradley L. Holly Bradley, Holly Bradley Holly Bradley Acute Rehabilitation Services Office number [phone] Pager [phone]  Holly Bradley Holly Bradley 06/22/2021, 3:11 PM

## 2021-06-22 NOTE — Progress Notes (Addendum)
9:10am - 10:50am:  CSW spoke with Holly Bradley again - he states that he attempted to cancel the [place] Medicaid but was unable to do so due to not being the patient's legal authorized representative. CSW obtained permission from Holly Bradley to reach out to [place] to see what can be done to address this issue.  CSW spoke with Holly Bradley at [place] [phone]) who states that this patient's enrollment source is [place] [phone]).  CSW spoke with Holly Bradley at [place] who states that the provider line for Medicaid in [place] should be utilized to discuss this member [phone]).  CSW spoke with Holly Bradley on the [facility] provider line - Holly Bradley states to contact the Department of Social Services in [place] directly to discuss this member [phone]).  CSW was on hold for 49 minutes prior to being disconnected from the queue to speak with a [place] representative.  8am: CSW spoke with patient's brother Holly Bradley who states he needs the patient to receive a few months, approximately 4-5 months of therapies at a facility prior to returning to his home. He does continue to state he does not want the patient placed for the rest of her life. [name] was able to obtain [facility] for patient. [name] agreeable for CSW to assist with [place] Medicaid application process.  CSW spoke with Holly Bradley of financial counseling who states the patient's [place] Medicaid will have to be cancelled prior to applying for [facility] Medicaid.   Holly Bradley, MSW, LCSW Transitions of Care  Clinical Social Worker II [phone]

## 2021-06-22 NOTE — Progress Notes (Signed)
[facility] PROGRESS NOTE  Holly Bradley LKG:[id number] DOB: 1976-12-05 DOA: 05/15/2021 PCP: Mitzi Hansen, MD  Status: Remains inpatient appropriate because:Altered mental status, Unsafe d/c plan, and Inpatient level of care appropriate due to severity of illness  Dispo: The patient is from: Home              Anticipated d/c is to: Home with brother with home health              Patient currently is medically stable to d/c.  Difficult to place patient Yes              Barriers to discharge: Patient has [place] Medicaid which must be canceled before she can be eligible for [facility].  Without [place] Medicaid she is not eligible to be placed in a skilled nursing facility  Level of care: Med-Surg  Code Status: Full code except for no CPR Family Communication: Patient only DVT prophylaxis: SCDs with history of hemorrhagic stroke COVID vaccination status: Unknown    HPI: 45 y.o. female past medical history of essential hypertension, diabetes mellitus type 2 large uterine fibroids paroxysmal SVT, chronic diastolic heart failure paranoid psychosis comes into the ED on 05/16/2019 with confusion was found to have a right MCA occlusion underwent thrombectomy which was complicated by contrast extravasation and M3 coiling embolization and revascularization.  She subsequently developed subarachnoid hemorrhage repeat imaging revealed right basal ganglia infarct with a 3 mm left shift, she was treated with hypertonic saline in the ICU, on 05/17/2021 she developed respiratory distress and he was intubated CT of the head showed increased cytotoxic edema and left side shift.  No mental status improvement is now trach and with nasal collar track for tube feedings.  She has been treated with Ancef and completed a 7-day course for MSSA pneumonia, her blood cultures on 05/21/2021 were positive for staph epi.  Infectious disease was consulted and are on board she has remained off antibiotics  since 2022 repeated blood cultures on 06/02/2021 have remained negative.  5/24 Admit to neurology after ED visit with AMS, found to have R MCA stroke, underwent mechanical thrombectomy, M3 coil embolization, subsequent SAH 5/26 decreased mental status and respiratory distress, intubated 6/1 still not candidate for extubation with poor mentation and cough mechanics. Family unsure if pt would want trach. Staph epi bacteremia, continues on ancef 6/2 moving RUE RLE following some commands. decision to move forward with tracheostomy.   6/3 perc trach. Slight WBC increase but no fever, continues on ancef 6/4 Tolerating PSV/CPAP 6/5 WBC up to 17, last day of ancef for staph pna. Trying ATC  6/9 ID consulted for staph epi bacteremia . Felt contamination and stopped abx. 6/12 had fever spikes but nothing to explain  6/13 change to cuffless trach  6/24 downsized to 4.0 cuffless trach and capping trials initiate 5/26 decannulated successfully  Subjective: Alert and sitting up in bed.  Very interactive but still prefers to not to questions asked.  Encouraged to use her voice.  Smiled when I told her that hamburger that she has been moaning is likely available on her D3 diet.  She reports that she is still on her menstrual cycle  Objective: Vitals:   06/22/21 0459 06/22/21 0827  BP: (!) 145/62 (!) 143/63  Pulse: 66 79  Resp: 16   Temp: 99 F (37.2 C)   SpO2: 100% 99%    Intake/Output Summary (Last 24 hours) at 06/22/2021 0839 Last data filed at 06/22/2021 0015 Gross per  24 hour  Intake --  Output 500 ml  Net -500 ml    Filed Weights   06/20/21 0332 06/21/21 0307 06/22/21 0600  Weight: 40.8 kg 43.5 kg 43.5 kg    Exam:  Constitutional: Alert and calm, no acute distress Respiratory: Lung sounds remain clear, she is stable on room air maintaining normal oximetry reading Cardiovascular: S1-S2, regular pulse, normotensive, no peripheral edema Abdomen:  LBM 6/29, baseline with enlarged uterus  with firm fibroids palpable, take has improved to 90% after transition to D3 diet with thin liquids. Neurologic: CN 2-12 grossly intact except for left facial droop when smiling.    Psychiatric: Alert and oriented x3 with pleasant affect.   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 Continue aspirin and Lipitor. Cont PT/OT; OOB to chair TID Brother would like to take patient home but given her level of care he is uncertain that he does not wish to place her in an SNF even if it is for rehabilitation only.  Once again she has been declined by CIR. social worker has discussed again with patient's brother on 7/1 and he is considering short-term SNF for rehab.  Cytotoxic cerebral edema/hypernatremia Initially treated with 3% saline  Sodium has normalized  Left lower quadrant abdominal pain/suspected diverticulitis and proctocolitis T-max 100.8 w/ resolution of pain 6/29.  CT abdomen pelvis: Radiologist questions possible diverticulitis as well as proctocolitis. Continue Cipro and Flagyl IV x7 days Continue Proctofoam per rectum x7 days Continue scheduled IV Toradol and per tube Oxy IR as needed for pain  Chronic constipation Cont Chronulac 10 g BID  Hypertension/grade 2 diastolic dysfunction Echocardiogram this admission reveals preserved LVEF with grade 2 diastolic dysfunction Continue carvedilol, hydralazine, Isordil, Entresto, Catapres and Norvasc  Dysphagia due to recent stroke MBSS repeated 6/30 with recommendation for dysphagia 3 diet and thin liquids core track discontinued Intake has improved with transition to more solid diet with thin liquids.  At this point we will continue IV fluids for an additional 24 hours then  discontinue.   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   Currently experiencing menses Hgb trend: 9.0 - 8.1 -8.7- 8.2  Protein calorie malnutrition Body mass index is 15.96 kg/m.  Continue dysphagia 1 diet with nectar thick liquids Continue nocturnal tube feedings with Jevity and Prosource per tube  History of paroxysmal SVT: Continue home amiodarone dose.  Acute respiratory failure with hypoxia status post tracheostomy/MSSA pneumonia Tolerated capping trials and was decannulated on 6/26 Completed 7 days of cefazolin  Fever of unknown source/resolved: Blood cultures on 5/30 with staph epidermis to have hominis-both likely contaminants Repeat blood cultures on 6/6 with staph epidermis Repeat blood cultures on 6/11 negative No fever for the past 9 days  Thrombocytosis: Acting as an acute phase reactant with platelets trending downward 531,000 as of 6/16 and as of 6/24 platelets have normalized to 207,000   Data Reviewed: Basic Metabolic Panel: Recent Labs  Lab 06/18/21 0813 06/22/21 0318  NA 135 138  K 4.3 4.3  CL 105 110  CO2 22 18*  GLUCOSE 109* 91  BUN 29* 26*  CREATININE 0.93 1.11*  CALCIUM 10.0 9.9  MG 1.8  --  PHOS 3.9  --    Liver Function Tests: No results for input(s): AST, ALT, ALKPHOS, BILITOT, PROT, ALBUMIN in the last 168 hours.  No results for input(s): LIPASE, AMYLASE in the last 168 hours. No results for input(s): AMMONIA in the last 168 hours. CBC: Recent Labs  Lab 06/20/21 0414 06/21/21 0426 06/22/21 0318  WBC 3.7* 4.7 3.8*  NEUTROABS 2.6 3.5 3.2  HGB 8.1* 8.7* 8.2*  HCT 26.0* 28.2* 26.4*  MCV 83.9 83.2 83.3  PLT 169 181 187    Cardiac Enzymes: No results for input(s): CKTOTAL, CKMB, CKMBINDEX, TROPONINI in the last 168 hours. BNP (last 3 results) No results for  input(s): BNP in the last 8760 hours.  ProBNP (last 3 results) No results for input(s): PROBNP in the last 8760 hours.  CBG: Recent Labs  Lab 06/21/21 2030 06/22/21 0007 06/22/21 0422 06/22/21 0637 06/22/21 0827  GLUCAP 98 90 94 92 83    No results found for this or any previous visit (from the past 240 hour(s)).   Studies: DG Swallowing Func-Speech Pathology  Result Date: 06/21/2021 Formatting of this result is different from the original. Objective Swallowing Evaluation: Type of Study: MBS-Modified Barium Swallow Study  Patient Details Name: Lilee Aldea MRN: [id number] Date of Birth: 1976/08/30 Today's Date: 06/21/2021 Time: SLP Start Time (ACUTE ONLY): 1020 -SLP Stop Time (ACUTE ONLY): 1045 SLP Time Calculation (min) (ACUTE ONLY): 25 min Past Medical History: Past Medical History: Diagnosis Date  Hypertension  Past Surgical History: Past Surgical History: Procedure Laterality Date  IR ANGIOGRAM FOLLOW UP STUDY  05/15/2021  IR CT HEAD LTD  05/15/2021  IR PERCUTANEOUS ART THROMBECTOMY/INFUSION INTRACRANIAL INC DIAG ANGIO  05/15/2021     IR PERCUTANEOUS ART THROMBECTOMY/INFUSION INTRACRANIAL INC DIAG ANGIO  05/15/2021  IR TRANSCATH/EMBOLIZ  05/15/2021  RADIOLOGY WITH ANESTHESIA N/A 05/15/2021  Procedure: IR WITH ANESTHESIA;  Surgeon: Radiologist, Medication, MD;  Location: [facility];  Service: Radiology;  Laterality: N/A; HPI: 45 yo female s/p mechanical thrombectomy on 5/24 of right MCA M1/M2/M3 and embolization of right MCA M3 branch due to contrast extravasation. Pt re-intubated 5/26. Repeat head CT on 5/26 showed increased cytotoxic edema and mildly increased leftward midline shift, now 5 mm, decreased SAH and IVH. Abdominal CT showing 22.9 cm mass, possibly consistent with uterine fibroids, no lymphadenopathy. s/p trach 6/3. MBS 6/21 and started on diet. Decannulated 6/26. PMH fibroids HTN HF paranoid psychosis recent hospitalizations january and february 2022.  Subjective: Alert and participatory  Assessment / Plan / Recommendation CHL IP CLINICAL IMPRESSIONS 06/21/2021 Clinical Impression Pt presents with improving swallow function with mild dysphagia marked by focal CN deficits V and VII on left that led to decreased bolus cohesion and some left lateral sulcus pocketing/residue.  There was excellent pharyngeal strength and NO residue post-swallow, of any consistency. There were occasional instances of very trace aspiration of thin liquids - this occurred when thin liquids were consumed in large, successive boluses and a slight amount entered the larynx PRIOR to closure of the vestibule.  There was a cough response. Laryngeal vestibule closure was complete.  Aspiration, when it occurred, was related to timing.  Pt demonstrated improved function when she took small, controlled sips of thin liquid.  Recommend advancing diet to dysphagia 3, thin liquids (small sips); give meds whole in puree. Pt was very pleased with outcome! SLP Visit Diagnosis Dysphagia, oropharyngeal phase (R13.12) Attention and concentration deficit following -- Frontal lobe and executive function deficit following -- Impact on safety and function Mild aspiration  risk   CHL IP TREATMENT RECOMMENDATION 06/21/2021 Treatment Recommendations Therapy as outlined in treatment plan below   Prognosis 06/21/2021 Prognosis for Safe Diet Advancement Good Barriers to Reach Goals -- Barriers/Prognosis Comment -- CHL IP DIET RECOMMENDATION 06/21/2021 SLP Diet Recommendations Dysphagia 3 (Mech soft) solids;Thin liquid Liquid Administration via Cup;Straw Medication Administration Whole meds with puree Compensations Slow rate;Small sips/bites;Monitor for anterior loss Postural Changes --   CHL IP OTHER RECOMMENDATIONS 06/21/2021 Recommended Consults -- Oral Care Recommendations Oral care BID Other Recommendations --   CHL IP FOLLOW UP RECOMMENDATIONS 06/21/2021 Follow up Recommendations Inpatient Rehab   CHL IP FREQUENCY AND DURATION 06/21/2021 Speech Therapy  Frequency (ACUTE ONLY) min 3x week Treatment Duration 2 weeks      CHL IP ORAL PHASE 06/21/2021 Oral Phase Impaired Oral - Pudding Teaspoon -- Oral - Pudding Cup -- Oral - Honey Teaspoon -- Oral - Honey Cup -- Oral - Nectar Teaspoon -- Oral - Nectar Cup Decreased bolus cohesion;Premature spillage Oral - Nectar Straw Decreased bolus cohesion;Premature spillage Oral - Thin Teaspoon -- Oral - Thin Cup Decreased bolus cohesion;Premature spillage Oral - Thin Straw Decreased bolus cohesion;Premature spillage Oral - Puree Left pocketing in lateral sulci Oral - Mech Soft -- Oral - Regular Left pocketing in lateral sulci Oral - Multi-Consistency -- Oral - Pill -- Oral Phase - Comment --  CHL IP PHARYNGEAL PHASE 06/21/2021 Pharyngeal Phase Impaired Pharyngeal- Pudding Teaspoon -- Pharyngeal -- Pharyngeal- Pudding Cup -- Pharyngeal -- Pharyngeal- Honey Teaspoon -- Pharyngeal -- Pharyngeal- Honey Cup -- Pharyngeal -- Pharyngeal- Nectar Teaspoon -- Pharyngeal -- Pharyngeal- Nectar Cup Delayed swallow initiation-vallecula Pharyngeal -- Pharyngeal- Nectar Straw Delayed swallow initiation-vallecula Pharyngeal -- Pharyngeal- Thin Teaspoon -- Pharyngeal -- Pharyngeal- Thin Cup Delayed swallow initiation-pyriform sinuses Pharyngeal -- Pharyngeal- Thin Straw Delayed swallow initiation-pyriform sinuses;Penetration/Aspiration before swallow Pharyngeal Material enters airway, passes BELOW cords and not ejected out despite cough attempt by patient Pharyngeal- Puree Delayed swallow initiation-vallecula Pharyngeal -- Pharyngeal- Mechanical Soft -- Pharyngeal -- Pharyngeal- Regular Delayed swallow initiation-pyriform sinuses;Delayed swallow initiation-vallecula Pharyngeal -- Pharyngeal- Multi-consistency -- Pharyngeal -- Pharyngeal- Pill -- Pharyngeal -- Pharyngeal Comment --  CHL IP CERVICAL ESOPHAGEAL PHASE 06/21/2021 Cervical Esophageal Phase WFL Pudding Teaspoon -- Pudding Cup -- Honey Teaspoon -- Honey Cup -- Nectar Teaspoon -- Nectar Cup  -- Nectar Straw -- Thin Teaspoon -- Thin Cup -- Thin Straw -- Puree -- Mechanical Soft -- Regular -- Multi-consistency -- Pill -- Cervical Esophageal Comment -- [name] 06/21/2021, 12:04 PM               Scheduled Meds:  amiodarone  200 mg Oral Daily   amLODipine  10 mg Oral Daily   aspirin  81 mg Oral Daily   atorvastatin  40 mg Oral Daily   carvedilol  25 mg Oral BID WC   chlorhexidine gluconate (MEDLINE KIT)  15 mL Mouth Rinse BID   Chlorhexidine Gluconate Cloth  6 each Topical Daily   cloNIDine  0.1 mg Oral TID   feeding supplement  237 mL Oral TID BM   ferrous sulfate  325 mg Oral BID WC   hydrALAZINE  100 mg Oral Q8H   hydrocortisone-pramoxine  1 applicator Rectal BID   isosorbide dinitrate  40 mg Oral TID   lactulose  10 g Oral BID   mouth rinse  15 mL Mouth Rinse 10 times per day   megestrol  80 mg Oral TID   multivitamin with minerals  1 tablet Oral Daily   pantoprazole  40  mg Oral Daily   sacubitril-valsartan  1 tablet Oral BID   sodium chloride flush  10-40 mL Intracatheter Q12H   Continuous Infusions:  sodium chloride     ciprofloxacin 200 mg (06/21/21 2230)   metronidazole 500 mg (06/22/21 0338)    Principal Problem:   Cerebrovascular accident (CVA) ([place]) Active Problems:   Acute respiratory failure with hypoxia (HCC)   Fibroid   Iron deficiency anemia due to chronic blood loss   Cerebral edema (HCC)   Chronic HFrEF (heart failure with reduced ejection fraction) ([place])   Pneumonia due to methicillin susceptible Staphylococcus aureus (HCC)   Abdominal distention   Encounter for feeding tube placement   Encounter for intubation   Ileus ([place])   Staphylococcus epidermidis bacteremia   Status post tracheostomy ([place])   Abnormal uterine bleeding (AUB)   Hemorrhagic stroke (HCC)   Hemiparesis (HCC)   Constipation   Uterine fibroid   Left hemiparesis ([place])   Diverticulitis of colon without hemorrhage    Proctocolitis   Consultants: PCCM Palliative medicine OB-GYN CIR General surgery Infectious disease  Procedures: Echocardiogram IR transcatheter embolization on 5/24 EEG IR placement of tracheostomy on 6/3  Antibiotics: Cefazolin 5/30 through 6/5   Time spent: 45 minutes    Erin Hearing ANP  [facility] 7 am - 330 pm/M-F for direct patient care and secure chat Please refer to Amion for contact info 38  days

## 2021-06-22 NOTE — Plan of Care (Signed)
  Problem: Education: Goal: Knowledge of disease or condition will improve Outcome: Progressing Goal: Knowledge of secondary prevention will improve Outcome: Progressing   

## 2021-06-23 LAB — CBC WITH DIFFERENTIAL/PLATELET
Abs Immature Granulocytes: 0.01 10*3/uL (ref 0.00–0.07)
Basophils Absolute: 0 10*3/uL (ref 0.0–0.1)
Basophils Relative: 0 %
Eosinophils Absolute: 0 10*3/uL (ref 0.0–0.5)
Eosinophils Relative: 0 %
HCT: 27.4 % — ABNORMAL LOW (ref 36.0–46.0)
Hemoglobin: 8.7 g/dL — ABNORMAL LOW (ref 12.0–15.0)
Immature Granulocytes: 0 %
Lymphocytes Relative: 15 %
Lymphs Abs: 0.7 10*3/uL (ref 0.7–4.0)
MCH: 26.4 pg (ref 26.0–34.0)
MCHC: 31.8 g/dL (ref 30.0–36.0)
MCV: 83.3 fL (ref 80.0–100.0)
Monocytes Absolute: 0.4 10*3/uL (ref 0.1–1.0)
Monocytes Relative: 9 %
Neutro Abs: 3.2 10*3/uL (ref 1.7–7.7)
Neutrophils Relative %: 76 %
Platelets: 200 10*3/uL (ref 150–400)
RBC: 3.29 MIL/uL — ABNORMAL LOW (ref 3.87–5.11)
WBC: 4.3 10*3/uL (ref 4.0–10.5)
nRBC: 0 % (ref 0.0–0.2)

## 2021-06-23 LAB — BASIC METABOLIC PANEL
Anion gap: 8 (ref 5–15)
BUN: 26 mg/dL — ABNORMAL HIGH (ref 6–20)
CO2: 18 mmol/L — ABNORMAL LOW (ref 22–32)
Calcium: 9.4 mg/dL (ref 8.9–10.3)
Chloride: 111 mmol/L (ref 98–111)
Creatinine, Ser: 1.08 mg/dL — ABNORMAL HIGH (ref 0.44–1.00)
GFR, Estimated: >60 mL/min (ref >60)
Glucose, Bld: 98 mg/dL (ref 70–99)
Potassium: 4.4 mmol/L (ref 3.5–5.1)
Sodium: 137 mmol/L (ref 135–145)

## 2021-06-23 LAB — GLUCOSE, CAPILLARY
Glucose-Capillary: 103 mg/dL — ABNORMAL HIGH (ref 70–99)
Glucose-Capillary: 106 mg/dL — ABNORMAL HIGH (ref 70–99)
Glucose-Capillary: 131 mg/dL — ABNORMAL HIGH (ref 70–99)
Glucose-Capillary: 148 mg/dL — ABNORMAL HIGH (ref 70–99)
Glucose-Capillary: 127 mg/dL — ABNORMAL HIGH (ref 70–99)

## 2021-06-23 MED ORDER — METRONIDAZOLE 500 MG PO TABS
500.0000 mg | ORAL_TABLET | Freq: Two times a day (BID) | ORAL | Status: DC
  Administered 2021-06-23 – 2021-06-25 (×5): 500 mg via ORAL
  Filled 2021-06-23 (×5): qty 1

## 2021-06-23 MED ORDER — CIPROFLOXACIN HCL 500 MG PO TABS
250.0000 mg | ORAL_TABLET | Freq: Two times a day (BID) | ORAL | Status: DC
  Administered 2021-06-23 – 2021-06-25 (×5): 250 mg via ORAL
  Filled 2021-06-23 (×5): qty 1

## 2021-06-23 NOTE — Progress Notes (Signed)
Subjective: Lying comfortably in bed-eating breakfast this morning.  Still on her menstrual period (Lasts 2 weeks typically)  Objective: Exam: Blood pressure (!) 150/65, pulse 70, temperature 99.4 F (37.4 C), temperature source Oral, resp. rate 18, height 5\' 5"  (1.651 m), weight 43.5 kg, SpO2 100 %.   Abdomen: Slightly distended-fibroids easily palpable Neurology: Continues to have dense left-sided hemiplegia Extremity: No edema  Pertinent labs: Hemoglobin 8.7 Creatinine: 1.08  Assessment/plan: 1.Dysphagia: Due to acute CVA-improved-SLP following.  No longer with NG tube. 2.Acute ischemic CVA s/p thrombectomy complicated by rupture of M3: Continues to have dense left-sided hemiplegia. 3.Acute hypoxic respiratory failure in the setting of acute CVA-MSSA pneumonia-required trach-now decannulated 4.Acute blood loss anemia from menorrhagia due to uterine fibroids: On Megace 5.Suspected diverticulitis/proctocolitis: On Cipro/Flagyl-no diarrhea-abdominal exam benign. 6.HTN: BP stable on amlodipine/Coreg/Isordil/Entresto 7.HFpEF: Euvolemic on exam-on Entresto/Coreg 8.History of SVT: On amiodarone  Time spent 25 min

## 2021-06-23 NOTE — Progress Notes (Signed)
PHARMACIST - PHYSICIAN COMMUNICATION DR:   Sloan Leiter CONCERNING: Antibiotic IV to Oral Route Change Policy  RECOMMENDATION: This patient is receiving Cipro/Flagyl by the intravenous route.  Based on criteria approved by the Pharmacy and Therapeutics Committee, the antibiotic(s) is/are being converted to the equivalent oral dose form(s).   DESCRIPTION: These criteria include: Patient being treated for a respiratory tract infection, urinary tract infection, cellulitis or clostridium difficile associated diarrhea if on metronidazole The patient is not neutropenic and does not exhibit a GI malabsorption state The patient is eating (either orally or via tube) and/or has been taking other orally administered medications for a least 24 hours The patient is improving clinically and has a Tmax < 100.5  If you have questions about this conversion, please contact the Pharmacy Department  []   [phone] )  [facility] []   [phone] )  [facility] [x]   [phone] )  [facility] []   [phone] )  [facility] []   [phone] )  [facility]

## 2021-06-24 LAB — GLUCOSE, CAPILLARY
Glucose-Capillary: 101 mg/dL — ABNORMAL HIGH (ref 70–99)
Glucose-Capillary: 106 mg/dL — ABNORMAL HIGH (ref 70–99)
Glucose-Capillary: 124 mg/dL — ABNORMAL HIGH (ref 70–99)
Glucose-Capillary: 93 mg/dL (ref 70–99)
Glucose-Capillary: 99 mg/dL (ref 70–99)
Glucose-Capillary: 99 mg/dL (ref 70–99)

## 2021-06-24 NOTE — Progress Notes (Signed)
Subjective: Eating breakfast this morning-awake/alert.  On her menstrual period which typically last 2 weeks per patient.  Objective: Exam: Blood pressure (!) 150/65, pulse 70, temperature 99.4 F (37.4 C), temperature source Oral, resp. rate 18, height 5\' 5"  (1.651 m), weight 43.5 kg, SpO2 100 %.   Continues to have significant left-sided hemiplegia.  Pertinent labs: None today  Assessment/plan: 1.Dysphagia: Due to acute CVA-improved-SLP following.  No longer with NG tube. 2.Acute ischemic CVA s/p thrombectomy complicated by rupture of M3: Continues to have dense left-sided hemiplegia. 3.Acute hypoxic respiratory failure in the setting of acute CVA-MSSA pneumonia-required trach-now decannulated 4.Acute blood loss anemia from menorrhagia due to uterine fibroids: On Megace 5.Suspected diverticulitis/proctocolitis: On Cipro/Flagyl-no diarrhea-abdominal exam benign. 6.HTN: BP stable on amlodipine/Coreg/Isordil/Entresto 7.HFpEF: Euvolemic on exam-on Entresto/Coreg 8.History of SVT: On amiodarone  Time spent 15 min

## 2021-06-25 LAB — GLUCOSE, CAPILLARY
Glucose-Capillary: 93 mg/dL (ref 70–99)
Glucose-Capillary: 89 mg/dL (ref 70–99)
Glucose-Capillary: 129 mg/dL — ABNORMAL HIGH (ref 70–99)
Glucose-Capillary: 138 mg/dL — ABNORMAL HIGH (ref 70–99)
Glucose-Capillary: 140 mg/dL — ABNORMAL HIGH (ref 70–99)
Glucose-Capillary: 149 mg/dL — ABNORMAL HIGH (ref 70–99)

## 2021-06-25 MED ORDER — ORAL CARE MOUTH RINSE
15.0000 mL | Freq: Every day | OROMUCOSAL | Status: DC
  Administered 2021-06-25 – 2021-07-03 (×39): 15 mL via OROMUCOSAL

## 2021-06-25 NOTE — Progress Notes (Addendum)
[facility] PROGRESS NOTE  Holly Bradley MWU:[id number] DOB: 1976/04/05 DOA: 05/15/2021 PCP: Mitzi Hansen, MD  Status: Remains inpatient appropriate because:Altered mental status, Unsafe d/c plan, and Inpatient level of care appropriate due to severity of illness  Dispo: The patient is from: Home              Anticipated d/c is to: SNF and eventually home with brother              Patient currently is medically stable to d/c.  Difficult to place patient Yes              Barriers to discharge: Patient has [place] Medicaid which must be canceled before she can be eligible for [facility].  Without [place] Medicaid she is not eligible to be placed in a skilled nursing facility  Level of care: Med-Surg  Code Status: Full code except for no CPR Family Communication: Patient only DVT prophylaxis: SCDs with history of hemorrhagic stroke COVID vaccination status: Unknown    HPI: 45 y.o. female past medical history of essential hypertension, diabetes mellitus type 2 large uterine fibroids paroxysmal SVT, chronic diastolic heart failure paranoid psychosis comes into the ED on 05/16/2019 with confusion was found to have a right MCA occlusion underwent thrombectomy which was complicated by contrast extravasation and M3 coiling embolization and revascularization.  She subsequently developed subarachnoid hemorrhage repeat imaging revealed right basal ganglia infarct with a 3 mm left shift, she was treated with hypertonic saline in the ICU, on 05/17/2021 she developed respiratory distress and he was intubated CT of the head showed increased cytotoxic edema and left side shift.  No mental status improvement is now trach and with nasal collar track for tube feedings.  She has been treated with Ancef and completed a 7-day course for MSSA pneumonia, her blood cultures on 05/21/2021 were positive for staph epi.  Infectious disease was consulted and are on board she has remained off antibiotics  since 2022 repeated blood cultures on 06/02/2021 have remained negative.  5/24 Admit to neurology after ED visit with AMS, found to have R MCA stroke, underwent mechanical thrombectomy, M3 coil embolization, subsequent SAH 5/26 decreased mental status and respiratory distress, intubated 6/1 still not candidate for extubation with poor mentation and cough mechanics. Family unsure if pt would want trach. Staph epi bacteremia, continues on ancef 6/2 moving RUE RLE following some commands. decision to move forward with tracheostomy.   6/3 perc trach. Slight WBC increase but no fever, continues on ancef 6/4 Tolerating PSV/CPAP 6/5 WBC up to 17, last day of ancef for staph pna. Trying ATC  6/9 ID consulted for staph epi bacteremia . Felt contamination and stopped abx. 6/12 had fever spikes but nothing to explain  6/13 change to cuffless trach  6/24 downsized to 4.0 cuffless trach and capping trials initiate 5/26 decannulated successfully  Subjective: Awakened during exam.  Smiled.  I told her it was okay to go back to sleep and she did so.  Objective: Vitals:   06/25/21 0401 06/25/21 0733  BP: (!) 145/69 139/68  Pulse: 68 72  Resp: 18 18  Temp: 98.6 F (37 C) 98.2 F (36.8 C)  SpO2: 100% 99%    Intake/Output Summary (Last 24 hours) at 06/25/2021 0811 Last data filed at 06/24/2021 2008 Gross per 24 hour  Intake 10 ml  Output --  Net 10 ml    Filed Weights   06/22/21 0600 06/24/21 0500 06/25/21 0500  Weight: 43.5 kg 45.7  kg 48.1 kg    Exam:  Constitutional: Appears to be in no acute distress.  Awaken briefly from sleep. Respiratory: Lungs clear, no increased work of breathing, stable on room air. Cardiovascular: S1-S2, no peripheral edema, regular pulse and normotensive Abdomen:  LBM 7/3, soft nontender nondistended with normoactive bowel sounds.  Eating 100% of meals now that has been transitioned over to D3 diet with thin liquids. Neurologic: CN 2-12 grossly intact except for  left facial droop when smiling.    Psychiatric: Sleeping but awakened with pleasant appropriate affect.  Went back to sleep.   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 Continue aspirin and Lipitor. Cont PT/OT; OOB to chair TID Brother would like to take patient home but given her level of care he is uncertain that he does not wish to place her in an SNF even if it is for rehabilitation only.  Once again she has been declined by CIR. social worker has discussed again with patient's brother on 7/1 and he is considering short-term SNF for rehab.  Cytotoxic cerebral edema/hypernatremia Initially treated with 3% saline  Sodium has normalized  Left lower quadrant abdominal pain/suspected diverticulitis and proctocolitis T-max 100.8 w/ resolution of pain 6/29.  CT abdomen pelvis: Radiologist questions possible diverticulitis as well as proctocolitis. Symptoms rapidly improved after the administration of NSAIDs and it is suspected that they were not related to diverticulitis but instead to menstrual pain-DC Cipro and Flagyl Continue Proctofoam per rectum x7 days  Chronic constipation Cont Chronulac 10 g BID  Hypertension/grade 2 diastolic dysfunction Echocardiogram this admission reveals preserved LVEF with grade 2 diastolic dysfunction Continue carvedilol, hydralazine, Isordil, Entresto, Catapres and Norvasc  Dysphagia due to recent stroke MBSS repeated 6/30 with recommendation for dysphagia 3 diet and thin liquids core track discontinued Intake has improved with transition to more solid diet with thin liquids.  At this point we will continue IV fluids for an additional 24 hours then discontinue.   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   Currently experiencing menses Hgb trend: 9.0 - 8.1 -8.7- 8.2  Protein calorie malnutrition Body mass index is 17.65 kg/m.  Continue dysphagia 1 diet with nectar thick liquids Continue nocturnal tube feedings with Jevity and Prosource per tube  History of paroxysmal SVT: Continue home amiodarone dose.  Acute respiratory failure with hypoxia status post tracheostomy/MSSA pneumonia Tolerated capping trials and was decannulated on 6/26 Completed 7 days of cefazolin  Fever of unknown source/resolved: Blood cultures on 5/30 with staph epidermis to have hominis-both likely contaminants Repeat blood cultures on 6/6 with staph epidermis Repeat blood cultures on 6/11 negative No fever for the past 9 days  Thrombocytosis: Acting as an acute phase reactant with platelets trending downward 531,000 as of 6/16 and as of 6/24 platelets have normalized to 207,000   Data Reviewed: Basic Metabolic Panel: Recent Labs  Lab 06/18/21 0813 06/22/21 0318 06/23/21 0335  NA 135 138 137  K 4.3 4.3 4.4  CL 105 110 111  CO2 22 18* 18*  GLUCOSE 109* 91 98  BUN 29* 26* 26*  CREATININE 0.93 1.11* 1.08*  CALCIUM 10.0 9.9 9.4  MG 1.8  --   --  PHOS 3.9  --   --    Liver Function Tests: No results for input(s): AST, ALT, ALKPHOS, BILITOT, PROT, ALBUMIN in the last 168 hours.  No results for input(s): LIPASE, AMYLASE in the last 168 hours. No results for input(s): AMMONIA in the last 168 hours. CBC: Recent Labs  Lab 06/20/21 0414 06/21/21 0426 06/22/21 0318 06/23/21 0335  WBC 3.7* 4.7 3.8* 4.3  NEUTROABS 2.6 3.5 3.2 3.2  HGB 8.1* 8.7* 8.2* 8.7*  HCT 26.0* 28.2* 26.4* 27.4*  MCV 83.9 83.2 83.3 83.3  PLT 169 181 187 200    Cardiac Enzymes: No results for input(s): CKTOTAL, CKMB, CKMBINDEX, TROPONINI in the last 168  hours. BNP (last 3 results) No results for input(s): BNP in the last 8760 hours.  ProBNP (last 3 results) No results for input(s): PROBNP in the last 8760 hours.  CBG: Recent Labs  Lab 06/24/21 0737 06/24/21 1149 06/24/21 1610 06/24/21 2008 06/25/21 0400  GLUCAP 99 99 93 124* 93    No results found for this or any previous visit (from the past 240 hour(s)).   Studies: No results found.  Scheduled Meds:  amiodarone  200 mg Oral Daily   amLODipine  10 mg Oral Daily   aspirin  81 mg Oral Daily   atorvastatin  40 mg Oral Daily   carvedilol  25 mg Oral BID WC   chlorhexidine gluconate (MEDLINE KIT)  15 mL Mouth Rinse BID   Chlorhexidine Gluconate Cloth  6 each Topical Daily   ciprofloxacin  250 mg Oral BID   cloNIDine  0.1 mg Oral TID   feeding supplement  237 mL Oral TID BM   ferrous sulfate  325 mg Oral BID WC   hydrALAZINE  100 mg Oral Q8H   hydrocortisone-pramoxine  1 applicator Rectal BID   isosorbide dinitrate  40 mg Oral TID   lactulose  10 g Oral BID   mouth rinse  15 mL Mouth Rinse 10 times per day   megestrol  80 mg Oral TID   metroNIDAZOLE  500 mg Oral Q12H   multivitamin with minerals  1 tablet Oral Daily   pantoprazole  40 mg Oral Daily   sacubitril-valsartan  1 tablet Oral BID   sodium chloride flush  10-40 mL Intracatheter Q12H   Continuous Infusions:    Principal Problem:   Cerebrovascular accident (CVA) ([place]) Active Problems:   Acute respiratory failure with hypoxia (HCC)   Fibroid   Iron deficiency anemia due to chronic blood loss   Cerebral edema (HCC)   Chronic HFrEF (heart failure with reduced ejection fraction) (HCC)   Pneumonia due to methicillin susceptible Staphylococcus aureus (HCC)   Abdominal distention   Encounter for feeding tube placement   Encounter for intubation   Ileus (HCC)   Staphylococcus epidermidis bacteremia   Status post tracheostomy ([place])   Abnormal uterine bleeding (AUB)   Hemorrhagic stroke (HCC)   Hemiparesis  (HCC)   Constipation   Uterine fibroid   Left hemiparesis (HCC)   Diverticulitis of colon without hemorrhage   Proctocolitis   Consultants: PCCM Palliative medicine OB-GYN CIR General surgery Infectious disease  Procedures: Echocardiogram IR transcatheter embolization on 5/24 EEG IR placement of tracheostomy on 6/3  Antibiotics: Cefazolin 5/30 through 6/5   Time spent: 45 minutes    Erin Hearing ANP  [facility] 7 am - 330 pm/M-F for direct patient care and secure chat Please refer to Amion for contact info 41  days

## 2021-06-25 NOTE — Progress Notes (Signed)
CSW sent follow up e-mail to [facility] at [facility] ([place]) requesting this patient be reviewed to possibility receive placement in a DTP bed.  Madilyn Fireman, MSW, LCSW Transitions of Care  Clinical Social Worker II [phone]

## 2021-06-25 NOTE — Progress Notes (Signed)
Occupational Therapy Treatment Patient Details Name: Holly Bradley MRN: [id number] DOB: 18-Aug-1976 Today's Date: 06/25/2021    History of present illness 45 yo female s/p mechanical thrombectomy on 5/24 of right MCA M1/M2/M3 and embolization of right MCA M3 branch due to contrast extravasation. Pt re-intubated 5/26. Repeat head CT on 5/26 showed increased cytotoxic edema and mildly increased leftward midline shift, now 5 mm, decreased SAH and IVH. Abdominal CT showing 22.9 cm mass, possibly consistent with uterine fibroids, no lymphadenopathy. s/p trach 6/3, decannulated 6/26. PMH fibriods HTN HF paranoid psychosis recent hospitalizations january and february 2022.  Trach removed 6/30.   OT comments  Pt seen in conjunction with PT to maximize pts activity tolerance and participation. Pt continues to present with L hemiparesis, impaired balance, decreased activity tolerance and impaired strength and coordination. Pt continues to c/o increased pain in LUE, utilized gait belt as sling to approximate LUE d/t sublux ( pt may benefit from actual sling for support on LUE during mobility). Pt currently requires MAX A +2 for simulated toilet transfer from EOB>recliner, pt additionally able tolerate multiple sit<>stands at sink with MAX A +2. Pt responds best to external cues to self correct posture and balance. Continue to highly recommend CIR as pt would greatly benefit from the intensity, structure  and specialized treatment (that only an intensive stroke team can provide) to potentially rehab pt to a level where pts brother would be comfortable taking pt home. Will follow acutely per POC.   Follow Up Recommendations  CIR    Equipment Recommendations  Wheelchair (measurements OT);Wheelchair cushion (measurements OT);Hospital bed;3 in 1 bedside commode    Recommendations for Other Services      Precautions / Restrictions Precautions Precautions: Fall;Other (comment) Precaution Comments: L hemiparesis,  PRAFO,  resting hand splint, sublux to L shoulder Required Braces or Orthoses: Other Brace Other Brace: L resting hand splint (4 hours on/4 hours off) Restrictions Weight Bearing Restrictions: No       Mobility Bed Mobility Overal bed mobility: Needs Assistance Bed Mobility: Rolling;Sidelying to Sit Rolling: Mod assist Sidelying to sit: Mod assist;+2 for physical assistance       General bed mobility comments: MOd +2 for roll to L for truncal translation, LLE lowering over EOB, and scooting to EOB with bed pads. Verbal cuing for sequencing task (reaching RUE to L railing, pushing trunk up with RUE on bedrailing, lowering legs over EOB).    Transfers Overall transfer level: Needs assistance Equipment used: 2 person hand held assist Transfers: Sit to/from Stand Sit to Stand: +2 physical assistance;+2 safety/equipment;Max assist         General transfer comment: max +2 for power up, rise, hip extension to upright, LUE shoulder approximation assisted by gait belt fashioned as sling, and steadying once standing. STS x4, from EOB x2 and recliner x2.    Balance Overall balance assessment: Needs assistance Sitting-balance support: Single extremity supported;Feet supported Sitting balance-Leahy Scale: Poor Sitting balance - Comments: heavy L lateral leaning, requiring max cues to correct posture to upright. EOB sitting x10 minutes, intervention for finding midline with external cuing, lateral leaning from PT to COTA shoulder Postural control: Posterior lean;Left lateral lean Standing balance support: Bilateral upper extremity supported;During functional activity Standing balance-Leahy Scale: Zero Standing balance comment: relies on external support - max +2                           ADL either performed or assessed with clinical judgement  ADL Overall ADL's : Needs assistance/impaired                         Toilet Transfer: Maximal assistance;+2 for  physical assistance;Stand-pivot Toilet Transfer Details (indicate cue type and reason): simulated via stand pivot transfer from EOB >recliner with MAX A +2         Functional mobility during ADLs: Maximal assistance;+2 for physical assistance General ADL Comments: pt continues to be flat but making efforts to verbalize needs with encouragment. pt continues to present with L hemiparesis, decreased activity tolerance, impaired balance and impaired strength and coordination     Vision       Perception     Praxis      Cognition Arousal/Alertness: Awake/alert Behavior During Therapy: WFL for tasks assessed/performed Overall Cognitive Status: Difficult to assess Area of Impairment: Following commands;Attention;Safety/judgement;Problem solving;Awareness;Memory                   Current Attention Level: Selective Memory: Decreased short-term memory (repeated cues) Following Commands: Follows one step commands with increased time Safety/Judgement: Decreased awareness of safety Awareness: Emergent Problem Solving: Slow processing;Requires verbal cues;Requires tactile cues;Difficulty sequencing;Decreased initiation General Comments: responds best to external cuing for postural adjustments, verbalizes multiple times during session. pt benefits from 2 choices when asking pt to verbalize needs        Exercises General Exercises - Lower Extremity Heel Slides: AAROM;Right;10 reps;Supine   Shoulder Instructions       General Comments      Pertinent Vitals/ Pain       Pain Assessment: Faces Faces Pain Scale: Hurts little more Pain Location: LUE Pain Descriptors / Indicators: Tender;Sore Pain Intervention(s): Limited activity within patient's tolerance;Monitored during session;Repositioned  Home Living                                          Prior Functioning/Environment              Frequency  Min 2X/week        Progress Toward Goals  OT  Goals(current goals can now be found in the care plan section)  Progress towards OT goals: Progressing toward goals  Acute Rehab OT Goals Patient Stated Goal: none stated Time For Goal Achievement: 06/25/21 Potential to Achieve Goals: [place] Frequency remains appropriate;Discharge plan remains appropriate    Co-evaluation      Reason for Co-Treatment: For patient/therapist safety;To address functional/ADL transfers;Complexity of the patient's impairments (multi-system involvement) PT goals addressed during session: Mobility/safety with mobility;Balance;Strengthening/ROM OT goals addressed during session: ADL's and self-care      AM-PAC OT "6 Clicks" Daily Activity     Outcome Measure   Help from another person eating meals?: A Little Help from another person taking care of personal grooming?: A Little Help from another person toileting, which includes using toliet, bedpan, or urinal?: A Lot Help from another person bathing (including washing, rinsing, drying)?: A Lot Help from another person to put on and taking off regular upper body clothing?: A Lot Help from another person to put on and taking off regular lower body clothing?: Total 6 Click Score: 13    End of Session Equipment Utilized During Treatment: Gait belt;Other (comment) (gait belt also used as sling for LUE)  OT Visit Diagnosis: Unsteadiness on feet (R26.81);Muscle weakness (generalized) (M62.81);Hemiplegia and hemiparesis Hemiplegia - Right/Left: Left  Hemiplegia - dominant/non-dominant: Non-Dominant Hemiplegia - caused by: Cerebral infarction   Activity Tolerance Patient tolerated treatment well   Patient Left in chair;with call bell/phone within reach;with chair alarm set   Nurse Communication Mobility status        Time: 9753-0051 OT Time Calculation (min): 43 min  Charges: OT General Charges $OT Visit: 1 Visit OT Treatments $Therapeutic Activity: 23-37 mins  [name], COTA/L Acute  Rehabilitation Services [place] 06/25/2021, 3:23 PM

## 2021-06-25 NOTE — Progress Notes (Signed)
Physical Therapy Treatment Patient Details Name: Holly Bradley MRN: [id number] DOB: 02-Jul-1976 Today's Date: 06/25/2021    History of Present Illness 45 yo female s/p mechanical thrombectomy on 5/24 of right MCA M1/M2/M3 and embolization of right MCA M3 branch due to contrast extravasation. Pt re-intubated 5/26. Repeat head CT on 5/26 showed increased cytotoxic edema and mildly increased leftward midline shift, now 5 mm, decreased SAH and IVH. Abdominal CT showing 22.9 cm mass, possibly consistent with uterine fibroids, no lymphadenopathy. s/p trach 6/3, decannulated 6/26. PMH fibriods HTN HF paranoid psychosis recent hospitalizations january and february 2022.  Trach removed 6/30.    PT Comments    Pt verbalizing multiple times during session, expressing needs and responding to questions. Pt continues to struggle with midline positioning in both sitting and standing, requires max cuing to correct but does so with more success towards end of session. Pt walked x3 ft with max +2 assist today, pt requiring assist for LLE progression and stabilization as well as posture. Pt may benefit from LUE sling during OOB training for LUE protection given subluxation. Will continue to follow acutely.    Follow Up Recommendations  SNF     Equipment Recommendations  Wheelchair (measurements PT);Wheelchair cushion (measurements PT);Hospital bed;Other (comment) (hoyer lift)    Recommendations for Other Services Rehab consult     Precautions / Restrictions Precautions Precautions: Fall;Other (comment) Precaution Comments: L hemiparesis, PRAFO,  resting hand splint, sublux to L shoulder Required Braces or Orthoses: Other Brace Other Brace: L resting hand splint (4 hours on/4 hours off)    Mobility  Bed Mobility Overal bed mobility: Needs Assistance Bed Mobility: Rolling;Sidelying to Sit Rolling: Mod assist Sidelying to sit: Mod assist;+2 for physical assistance       General bed mobility  comments: MOd +2 for roll to L for truncal translation, LLE lowering over EOB, and scooting to EOB with bed pads. Verbal cuing for sequencing task (reaching RUE to L railing, pushing trunk up with RUE on bedrailing, lowering legs over EOB).    Transfers Overall transfer level: Needs assistance Equipment used: 2 person hand held assist Transfers: Sit to/from Stand Sit to Stand: +2 physical assistance;+2 safety/equipment;Max assist         General transfer comment: max +2 for power up, rise, hip extension to upright, LUE shoulder approximation assisted by gait belt fashioned as sling, and steadying once standing. STS x4, from EOB x2 and recliner x2.  Ambulation/Gait Ambulation/Gait assistance: Max assist;+2 physical assistance Gait Distance (Feet): 3 Feet Assistive device: 2 person hand held assist Gait Pattern/deviations: Step-to pattern;Decreased stance time - left;Decreased dorsiflexion - left;Decreased weight shift to left;Trunk flexed;Decreased step length - left Gait velocity: dcr   General Gait Details: max +2 for truncal support, maintaining truncal midline, LLE progression during swing phase and close guarding during stance phase. Step-by-step verbal cuing for sequencing gait.   Stairs             Wheelchair Mobility    Modified Rankin (Stroke Patients Only) Modified Rankin (Stroke Patients Only) Pre-Morbid Rankin Score: No symptoms Modified Rankin: Moderately severe disability     Balance Overall balance assessment: Needs assistance Sitting-balance support: Single extremity supported;Feet supported Sitting balance-Leahy Scale: Poor Sitting balance - Comments: heavy L lateral leaning, requiring max cues to correct posture to upright. EOB sitting x10 minutes, intervention for finding midline with external cuing, lateral leaning from PT to COTA shoulder Postural control: Posterior lean;Left lateral lean Standing balance support: Bilateral upper extremity  supported;During functional activity  Standing balance-Leahy Scale: Zero Standing balance comment: relies on external support - max +2                            Cognition Arousal/Alertness: Awake/alert Behavior During Therapy: WFL for tasks assessed/performed Overall Cognitive Status: Difficult to assess Area of Impairment: Following commands;Attention;Safety/judgement;Problem solving;Awareness;Memory                   Current Attention Level: Selective   Following Commands: Follows one step commands with increased time Safety/Judgement: Decreased awareness of safety Awareness: Emergent Problem Solving: Slow processing;Requires verbal cues;Requires tactile cues;Difficulty sequencing;Decreased initiation General Comments: responds best to external cuing for postural adjustments, verbalizes multiple times during session      Exercises General Exercises - Lower Extremity Heel Slides: AAROM;Right;10 reps;Supine    General Comments        Pertinent Vitals/Pain Pain Assessment: Faces Faces Pain Scale: Hurts little more Pain Location: LUE Pain Descriptors / Indicators: Tender;Sore Pain Intervention(s): Limited activity within patient's tolerance;Monitored during session;Repositioned    Home Living                      Prior Function            PT Goals (current goals can now be found in the care plan section) Acute Rehab PT Goals Patient Stated Goal: none stated PT Goal Formulation: With patient Time For Goal Achievement: 07/02/21 Potential to Achieve Goals: Fair Progress towards PT goals: Progressing toward goals    Frequency    Min 3X/week      PT Plan Current plan remains appropriate    Co-evaluation PT/OT/SLP Co-Evaluation/Treatment: Yes Reason for Co-Treatment: For patient/therapist safety;To address functional/ADL transfers;Complexity of the patient's impairments (multi-system involvement) PT goals addressed during session:  Mobility/safety with mobility;Balance;Strengthening/ROM        AM-PAC PT "6 Clicks" Mobility   Outcome Measure  Help needed turning from your back to your side while in a flat bed without using bedrails?: A Lot Help needed moving from lying on your back to sitting on the side of a flat bed without using bedrails?: A Lot Help needed moving to and from a bed to a chair (including a wheelchair)?: A Lot Help needed standing up from a chair using your arms (e.g., wheelchair or bedside chair)?: A Lot Help needed to walk in hospital room?: Total Help needed climbing 3-5 steps with a railing? : Total 6 Click Score: 10    End of Session Equipment Utilized During Treatment: Gait belt Activity Tolerance: Patient tolerated treatment well Patient left: with call bell/phone within reach;in chair;with chair alarm set Nurse Communication: Mobility status;Other (comment) (Stand pivot towards R for back to bed) PT Visit Diagnosis: Hemiplegia and hemiparesis;Other abnormalities of gait and mobility (R26.89);Unsteadiness on feet (R26.81);Muscle weakness (generalized) (M62.81);Difficulty in walking, not elsewhere classified (R26.2);Other symptoms and signs involving the nervous system (R29.898);Apraxia (R48.2) Hemiplegia - Right/Left: Left Hemiplegia - dominant/non-dominant: Non-dominant Hemiplegia - caused by: Cerebral infarction;Nontraumatic SAH;Nontraumatic intracerebral hemorrhage     Time: 1401-1445 PT Time Calculation (min) (ACUTE ONLY): 44 min  Charges:  $Therapeutic Activity: 8-22 mins                     Stacie Glaze, PT DPT Acute Rehabilitation Services Pager [phone]  Office [phone]    [place] 06/25/2021, 3:15 PM

## 2021-06-26 LAB — GLUCOSE, CAPILLARY
Glucose-Capillary: 111 mg/dL — ABNORMAL HIGH (ref 70–99)
Glucose-Capillary: 102 mg/dL — ABNORMAL HIGH (ref 70–99)
Glucose-Capillary: 94 mg/dL (ref 70–99)
Glucose-Capillary: 115 mg/dL — ABNORMAL HIGH (ref 70–99)
Glucose-Capillary: 105 mg/dL — ABNORMAL HIGH (ref 70–99)
Glucose-Capillary: 140 mg/dL — ABNORMAL HIGH (ref 70–99)

## 2021-06-26 MED ORDER — METHOCARBAMOL 500 MG PO TABS
500.0000 mg | ORAL_TABLET | ORAL | Status: AC
  Administered 2021-06-26: 500 mg via ORAL
  Filled 2021-06-26: qty 1

## 2021-06-26 NOTE — Progress Notes (Signed)
[facility] PROGRESS NOTE  Nasirah Sachs ZOX:[id number] DOB: Jun 07, 1976 DOA: 05/15/2021 PCP: Mitzi Hansen, MD  Status: Remains inpatient appropriate because:Altered mental status, Unsafe d/c plan, and Inpatient level of care appropriate due to severity of illness  Dispo: The patient is from: Home              Anticipated d/c is to: SNF and eventually home with brother              Patient currently is medically stable to d/c.  Difficult to place patient Yes              Barriers to discharge: Patient has [place] Medicaid which must be canceled before she can be eligible for [facility].  Without [place] Medicaid she is not eligible to be placed in a skilled nursing facility  Level of care: Med-Surg  Code Status: Full code except for no CPR Family Communication: Patient only DVT prophylaxis: SCDs with history of hemorrhagic stroke COVID vaccination status: Unknown    HPI: 45 y.o. female past medical history of essential hypertension, diabetes mellitus type 2 large uterine fibroids paroxysmal SVT, chronic diastolic heart failure paranoid psychosis comes into the ED on 05/16/2019 with confusion was found to have a right MCA occlusion underwent thrombectomy which was complicated by contrast extravasation and M3 coiling embolization and revascularization.  She subsequently developed subarachnoid hemorrhage repeat imaging revealed right basal ganglia infarct with a 3 mm left shift, she was treated with hypertonic saline in the ICU, on 05/17/2021 she developed respiratory distress and he was intubated CT of the head showed increased cytotoxic edema and left side shift.  No mental status improvement is now trach and with nasal collar track for tube feedings.  She has been treated with Ancef and completed a 7-day course for MSSA pneumonia, her blood cultures on 05/21/2021 were positive for staph epi.  Infectious disease was consulted and are on board she has remained off antibiotics  since 2022 repeated blood cultures on 06/02/2021 have remained negative.  5/24 Admit to neurology after ED visit with AMS, found to have R MCA stroke, underwent mechanical thrombectomy, M3 coil embolization, subsequent SAH 5/26 decreased mental status and respiratory distress, intubated 6/1 still not candidate for extubation with poor mentation and cough mechanics. Family unsure if pt would want trach. Staph epi bacteremia, continues on ancef 6/2 moving RUE RLE following some commands. decision to move forward with tracheostomy.   6/3 perc trach. Slight WBC increase but no fever, continues on ancef 6/4 Tolerating PSV/CPAP 6/5 WBC up to 17, last day of ancef for staph pna. Trying ATC  6/9 ID consulted for staph epi bacteremia . Felt contamination and stopped abx. 6/12 had fever spikes but nothing to explain  6/13 change to cuffless trach  6/24 downsized to 4.0 cuffless trach and capping trials initiate 6/26 decannulated successfully 6/30 cortrack tube removed and tube feedings discontinued  Subjective: Awakened from sleep.  Interactive and smiling and nodding but not making any attempts to verbalize.  Denies any pain.  Was able to determine that she did not like the food on her breakfast tray this morning.  Apparently the hashbrowns were undercooked.  Objective: Vitals:   06/26/21 0009 06/26/21 0449  BP: (!) 108/57 (!) 142/75  Pulse: 87 81  Resp: 18 16  Temp: 99.7 F (37.6 C) 98.8 F (37.1 C)  SpO2: 98% 100%    Intake/Output Summary (Last 24 hours) at 06/26/2021 8119 Last data filed at 06/25/2021 2318 Gross  per 24 hour  Intake 600 ml  Output 951 ml  Net -351 ml    Filed Weights   06/24/21 0500 06/25/21 0500 06/26/21 0500  Weight: 45.7 kg 48.1 kg 70.6 kg    Exam:  Constitutional: Awakens easily, no acute distress Respiratory: Anterior lung sounds remain clear.  She remained stable on room air with normal pulse oximetry and no increased work of breathing. Cardiovascular:  S1-S2, normotensive, no peripheral edema, regular pulse without resting tachycardia Abdomen:  LBM 7/3, soft nontender nondistended with normal active bowel sounds.  For the most part has been eating much better since diet transition to D3 with thin liquids. Neurologic: CN 2-12 grossly intact except for left facial droop when smiling.    Psychiatric: Alert and oriented x3.  Pleasant affect.   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 Continue aspirin and Lipitor. Cont PT/OT; OOB to chair TID-recommendation is for SNF for rehabilitative therapies LUE ordered requested by therapies  Cytotoxic cerebral edema/hypernatremia Initially treated with 3% saline  Sodium has normalized  Left lower quadrant abdominal pain/suspected diverticulitis and proctocolitis T-max 100.8 w/ resolution of pain 6/29.  CT abdomen pelvis: Radiologist questions possible diverticulitis as well as proctocolitis. Symptoms rapidly improved after the administration of NSAIDs and it is suspected pain was secondary to menstrual pain Continue Proctofoam per rectum x7 days  Chronic constipation Cont Chronulac 10 g BID  Hypertension/grade 2 diastolic dysfunction Echocardiogram this admission reveals preserved LVEF with grade 2 diastolic dysfunction Continue carvedilol, hydralazine, Isordil, Entresto, Catapres and Norvasc  Dysphagia due to recent stroke MBSS repeated 6/30 with recommendation for dysphagia 3 diet and thin liquids core track discontinued Intake has improved with transition to more solid diet with thin liquids.  At this point we will continue IV fluids for an additional 24 hours then discontinue.   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   Currently experiencing menses Hgb trend: 9.0 - 8.1 -8.7- 8.2  Protein calorie malnutrition Body mass index is 25.9 kg/m.  Continue dysphagia 1 diet with nectar thick liquids Continue nocturnal tube feedings with Jevity and Prosource per tube  History of paroxysmal SVT: Continue home amiodarone dose.  Acute respiratory failure with hypoxia status post tracheostomy/MSSA pneumonia Tolerated capping trials and was decannulated on 6/26 Completed 7 days of cefazolin  Fever of unknown source/resolved: Blood cultures on 5/30 with staph epidermis to have hominis-both likely contaminants Repeat blood cultures on 6/6 with staph epidermis Repeat blood cultures on 6/11 negative  Thrombocytosis: Resolved Likely acute phase reactant with peak platelets 531,000 on 6/16 and as of 6/24 platelets have normalized to 207,000   Data Reviewed: Basic Metabolic Panel: Recent Labs  Lab 06/22/21 0318 06/23/21 0335  NA 138 137  K 4.3 4.4  CL 110 111  CO2 18* 18*  GLUCOSE 91 98  BUN 26* 26*  CREATININE 1.11* 1.08*  CALCIUM 9.9 9.4   Liver Function Tests: No results for input(s): AST, ALT, ALKPHOS, BILITOT, PROT, ALBUMIN in the last 168 hours.  No results for input(s): LIPASE, AMYLASE in the last 168 hours. No results for input(s): AMMONIA in the last 168 hours. CBC: Recent Labs  Lab 06/20/21 0414 06/21/21  4163 06/22/21 0318 06/23/21 0335  WBC 3.7* 4.7 3.8* 4.3  NEUTROABS 2.6 3.5 3.2 3.2  HGB 8.1* 8.7* 8.2* 8.7*  HCT 26.0* 28.2* 26.4* 27.4*  MCV 83.9 83.2 83.3 83.3  PLT 169 181 187 200    Cardiac Enzymes: No results for input(s): CKTOTAL, CKMB, CKMBINDEX, TROPONINI in the last 168 hours. BNP (last 3 results) No results for input(s): BNP in the last 8760 hours.  ProBNP (last 3 results) No results for input(s): PROBNP in the  last 8760 hours.  CBG: Recent Labs  Lab 06/25/21 1147 06/25/21 1645 06/25/21 2002 06/25/21 2317 06/26/21 0343  GLUCAP 129* 138* 140* 149* 111*    No results found for this or any previous visit (from the past 240 hour(s)).   Studies: No results found.  Scheduled Meds:  amiodarone  200 mg Oral Daily   amLODipine  10 mg Oral Daily   aspirin  81 mg Oral Daily   atorvastatin  40 mg Oral Daily   carvedilol  25 mg Oral BID WC   chlorhexidine gluconate (MEDLINE KIT)  15 mL Mouth Rinse BID   Chlorhexidine Gluconate Cloth  6 each Topical Daily   cloNIDine  0.1 mg Oral TID   feeding supplement  237 mL Oral TID BM   ferrous sulfate  325 mg Oral BID WC   hydrALAZINE  100 mg Oral Q8H   hydrocortisone-pramoxine  1 applicator Rectal BID   isosorbide dinitrate  40 mg Oral TID   lactulose  10 g Oral BID   mouth rinse  15 mL Mouth Rinse 5 X Daily   megestrol  80 mg Oral TID   multivitamin with minerals  1 tablet Oral Daily   pantoprazole  40 mg Oral Daily   sacubitril-valsartan  1 tablet Oral BID   sodium chloride flush  10-40 mL Intracatheter Q12H   Continuous Infusions:    Principal Problem:   Cerebrovascular accident (CVA) ([place]) Active Problems:   Acute respiratory failure with hypoxia (HCC)   Fibroid   Iron deficiency anemia due to chronic blood loss   Cerebral edema (HCC)   Chronic HFrEF (heart failure with reduced ejection fraction) (HCC)   Pneumonia due to methicillin susceptible Staphylococcus aureus (HCC)   Abdominal distention   Encounter for feeding tube placement   Encounter for intubation   Ileus (HCC)   Staphylococcus epidermidis bacteremia   Status post tracheostomy ([place])   Abnormal uterine bleeding (AUB)   Hemorrhagic stroke (HCC)   Hemiparesis (HCC)   Constipation   Uterine fibroid   Left hemiparesis ([place])   Diverticulitis of colon without hemorrhage   Proctocolitis   Consultants: PCCM Palliative medicine OB-GYN CIR General surgery Infectious  disease  Procedures: Echocardiogram IR transcatheter embolization on 5/24 EEG IR placement of tracheostomy on 6/3  Antibiotics: Cefazolin 5/30 through 6/5   Time spent: 45 minutes    Erin Hearing ANP  [facility] 7 am - 330 pm/M-F for direct patient care and secure chat Please refer to Amion for contact info 42  days

## 2021-06-26 NOTE — Progress Notes (Addendum)
Occupational Therapy Treatment Patient Details Name: Holly Bradley MRN: [id number] DOB: [birth year] Today's Date: 06/26/2021    History of present illness 45 yo female s/p mechanical thrombectomy on 5/24 of right MCA M1/M2/M3 and embolization of right MCA M3 branch due to contrast extravasation. Pt re-intubated 5/26. Repeat head CT on 5/26 showed increased cytotoxic edema and mildly increased leftward midline shift, now 5 mm, decreased SAH and IVH. Abdominal CT showing 22.9 cm mass, possibly consistent with uterine fibroids, no lymphadenopathy. s/p trach 6/3,  decannulated 6/26, and Trach removed 6/30. PMH fibriods HTN HF paranoid psychosis recent hospitalizations january and february 2022Trach removed 6/30.   OT comments  Pt progressing towards established OT goals. Performing manual therapy and mobilization of LUE to reduce pain and increase functionality. Pt performing bed mobility with mod A +2 and sit<>stand transfer with max A+2 using Stedy. Working on sitting balance and upright posture at sink with Stedy, pt requiring mod A for sitting balance. Pt continues to present with L hemiparesis, decreased activity tolerance, balance, strength, coordination, and cognition. Due to pt motivation, age, and significant change in functional status, recommend CIR for intensive OT services and will follow acutely to optimize independence in ADLs/IADLs. Updated goals.   Follow Up Recommendations  CIR    Equipment Recommendations  Wheelchair (measurements OT);Wheelchair cushion (measurements OT);Hospital bed;3 in 1 bedside commode    Recommendations for Other Services Rehab consult    Precautions / Restrictions Precautions Precautions: Fall;Other (comment) Precaution Comments: L hemiparesis, PRAFO,  resting hand splint, sublux to L shoulder Required Braces or Orthoses: Other Brace Other Brace: L resting hand splint (4 hours on/4 hours off) Restrictions Weight Bearing Restrictions: No        Mobility Bed Mobility Overal bed mobility: Needs Assistance Bed Mobility: Supine to Sit     Supine to sit: Max assist;+2 for physical assistance Sit to supine: +2 for physical assistance;Max assist   General bed mobility comments: Max A to move feet to EOB and elevate trunk.    Transfers Overall transfer level: Needs assistance Equipment used: 2 person hand held assist Transfers: Sit to/from Stand Sit to Stand: +2 physical assistance;+2 safety/equipment;Max assist         General transfer comment: max A+2 for power up and steadying. LUE sling worn during standing.    Balance Overall balance assessment: Needs assistance Sitting-balance support: Single extremity supported;Feet supported Sitting balance-Leahy Scale: Poor Sitting balance - Comments: L lateral lean requiring max verbal and tactile cues to acheve midline. Postural control: Posterior lean;Left lateral lean Standing balance support: Single extremity supported;During functional activity Standing balance-Leahy Scale: Zero Standing balance comment: reliant on UE support with stedy and requires max external support.                           ADL either performed or assessed with clinical judgement   ADL Overall ADL's : Needs assistance/impaired                         Toilet Transfer: Maximal assistance;+2 for physical assistance;+2 for safety/equipment (sit<>stand) Toilet Transfer Details (indicate cue type and reason): simulated toilet transfer with max A +2         Functional mobility during ADLs: Maximal assistance;+2 for physical assistance General ADL Comments: Focus session on mobilizing LUE. Performing sit<>stand transfer with stedy with Max A +2 during session to simulate toilet transfer. Moving pt to sink to work on sitting balance  and upright posture 55mins. Pt continues to present with L hemiparesis, decreased activity tolerance, impaired balance and impaired strength and  coordination     Vision       Perception     Praxis      Cognition Arousal/Alertness: Awake/alert Behavior During Therapy: WFL for tasks assessed/performed Overall Cognitive Status: Difficult to assess Area of Impairment: Following commands;Attention;Safety/judgement;Problem solving;Awareness;Memory                   Current Attention Level: Selective Memory: Decreased short-term memory Following Commands: Follows one step commands with increased time Safety/Judgement: Decreased awareness of safety Awareness: Emergent Problem Solving: Slow processing;Requires verbal cues;Requires tactile cues;Difficulty sequencing;Decreased initiation General Comments: Pt with increased time to process commands. Pt with little verbalizations predominantly verbalizing pain locations. Pt with STM difficulty, emergent awarenes as indicated by reacting to pain and repositioning LUE.        Exercises Exercises: General Upper Extremity;Shoulder Other Exercises Other Exercises: manual therapy LUE. Taping L subluxed shoulder. Other Exercises: PROM L scapular elevation/depression x10; PROM L scapular protraction/retraction x10 reps; Other Exercises: LUE shoulder flexion x10; L shoulder abduction x5; L shoulder external rotation x5 Other Exercises: PROM composite flexion/extension of LUE digits x5, PROM L wrist flexion/extension x5 Other Exercises: truncal rotation moving hips to R side. Pt not tolerating well x1 repitition.   Shoulder Instructions       General Comments VSS. Pt verbalizing pain and RN notified.    Pertinent Vitals/ Pain       Pain Assessment: Faces Faces Pain Scale: Hurts even more Pain Location: L hand and L hip Pain Descriptors / Indicators: Tender;Sore Pain Intervention(s): Limited activity within patient's tolerance;Monitored during session;Repositioned;Patient requesting pain meds-RN notified  Home Living                                           Prior Functioning/Environment              Frequency  Min 5X/week        Progress Toward Goals  OT Goals(current goals can now be found in the care plan section)  Progress towards OT goals: Progressing toward goals  Acute Rehab OT Goals Patient Stated Goal: none stated OT Goal Formulation: Patient unable to participate in goal setting Time For Goal Achievement: 07/10/21 Potential to Achieve Goals: Fair ADL Goals Pt Will Perform Grooming: sitting;with mod assist Additional ADL Goal #1: pt will static sit with mod (A) as precursor to adls Additional ADL Goal #2: pt will tolerate L resting hand splint for up to 6 hours on Additional ADL Goal #3: Pt will perform basic transfer with mod A +2 as a precursor to ADLs  Plan Discharge plan remains appropriate;Frequency needs to be updated    Co-evaluation                 AM-PAC OT "6 Clicks" Daily Activity     Outcome Measure   Help from another person eating meals?: A Little Help from another person taking care of personal grooming?: A Little Help from another person toileting, which includes using toliet, bedpan, or urinal?: A Lot Help from another person bathing (including washing, rinsing, drying)?: A Lot Help from another person to put on and taking off regular upper body clothing?: A Lot Help from another person to put on and taking off regular lower body clothing?: Total 6 Click  Score: 13    End of Session Equipment Utilized During Treatment: Other (comment) (stedy)  OT Visit Diagnosis: Unsteadiness on feet (R26.81);Muscle weakness (generalized) (M62.81);Hemiplegia and hemiparesis Hemiplegia - Right/Left: Left Hemiplegia - dominant/non-dominant: Non-Dominant Hemiplegia - caused by: Cerebral infarction   Activity Tolerance Patient tolerated treatment well   Patient Left in bed;with call bell/phone within reach;Other (comment) (with chaplain in room)   Nurse Communication Mobility status        Time:  6468-0321 OT Time Calculation (min): 69 min  Charges: OT General Charges $OT Visit: 1 Visit OT Treatments $Self Care/Home Management : 8-22 mins $Therapeutic Activity: 53-67 mins  Shanda Howells, OTDS    Shanda Howells 06/26/2021, 4:24 PM

## 2021-06-26 NOTE — Progress Notes (Signed)
  Speech Language Pathology Treatment: Cognitive-Linquistic  Patient Details Name: Renay Crammer MRN: [id number] DOB: [birth year] Today's Date: 06/26/2021 Time: 7290-2111 SLP Time Calculation (min) (ACUTE ONLY): 22 min  Assessment / Plan / Recommendation Clinical Impression  Pt continues to make steady progress.  Today she demonstrated improved spontaneity of verbal output with improved timeliness of responses.  Speech is mildly dysarthric with flat prosody.  She is making needs known with min verbal cues to initiate and provide more detail.  She was able to read cards given to her by a friend from [place] with mod verbal cues to attend to left portion of print on page.  She followed two step commands without difficulty; three step with min cues.    Pt would benefit from more thorough cognitive/language assessment now that she is talking/engaging so much more.  She continues to tolerate dysphagia 3/thin liquids with occasional coughing and bolus loss on left; lungs remain clear.  SLP will follow for communication/swallowing. Excellent gains.    HPI HPI: 45 yo female s/p mechanical thrombectomy on 5/24 of right MCA M1/M2/M3 and embolization of right MCA M3 branch due to contrast extravasation. Pt re-intubated 5/26. Repeat head CT on 5/26 showed increased cytotoxic edema and mildly increased leftward midline shift, now 5 mm, decreased SAH and IVH. Abdominal CT showing 22.9 cm mass, possibly consistent with uterine fibroids, no lymphadenopathy. s/p trach 6/3. MBS 6/21 and started on diet. Decannulated 6/26. Second MBS 6/30, diet advanced to dys3/thin. PMH fibroids HTN HF paranoid psychosis recent hospitalizations january and february 2022.      SLP Plan  Continue with current plan of care       Recommendations  Diet recommendations: Dysphagia 3 (mechanical soft);Thin liquid Liquids provided via: Cup;Straw Medication Administration: Whole meds with puree Supervision: Patient able to self  feed;Full supervision/cueing for compensatory strategies Compensations: Small sips/bites                Oral Care Recommendations: Oral care BID Follow up Recommendations: Inpatient Rehab SLP Visit Diagnosis: Cognitive communication deficit (B52.080) Plan: Continue with current plan of care       GO             Chasin Findling L. [name], [place] CCC/SLP Acute Rehabilitation Services Office number [phone] Pager [phone]   [name] 06/26/2021, 11:06 AM

## 2021-06-26 NOTE — Progress Notes (Addendum)
2pm: CSW received letter from patient's brother to support the termination of [place] Medicaid.  CSW retrieved HCPOA paperwork from patient's chart - her name is spelled wrong throughout documentation. A copy of documentation was given to [facility], notary public in [place] to be revised.  CSW notified patient's brother of need for HCPOA to be redone.  10am: CSW spoke with Gerald Stabs at the [place] who states a letter stating the reasons for the need to cancel the [facility] policy along with a copy of the [facility] paperwork needs to be faxed to [phone].   CSW spoke with patient's brother [name] who is agreeable to write the letter and have it submitted to [facility] on his sister's behalf.  Madilyn Fireman, MSW, LCSW Transitions of Care  Clinical Social Worker II [phone]

## 2021-06-26 NOTE — Progress Notes (Signed)
Orthopedic Tech Progress Note Patient Details:  Holly Bradley [birth year] [id number]  Ortho Devices Type of Ortho Device: Sling immobilizer Ortho Device/Splint Location: LUE Ortho Device/Splint Interventions: Ordered, Application, Adjustment   Post Interventions Patient Tolerated: Well Instructions Provided: Care of device  Janit Pagan 06/26/2021, 1:41 PM

## 2021-06-26 NOTE — Progress Notes (Signed)
This chaplain responded to Spiritual Care's request from [place] Worker to correct the Pt. misspelled name on the Pt. Advance Directive.  The chaplain filled out a new Advance Directive with the Pt.  The new document is in the Pt. chart waiting to be notarized. The chaplain anticipates a notary will be available between 1-3pm on Wednesday.  Per the Pt. request, the Pt. brother-[name] was updated by phone of the AD progress. The chaplain secure chatted Arbie Cookey with the progress.

## 2021-06-27 LAB — GLUCOSE, CAPILLARY
Glucose-Capillary: 104 mg/dL — ABNORMAL HIGH (ref 70–99)
Glucose-Capillary: 106 mg/dL — ABNORMAL HIGH (ref 70–99)
Glucose-Capillary: 147 mg/dL — ABNORMAL HIGH (ref 70–99)
Glucose-Capillary: 170 mg/dL — ABNORMAL HIGH (ref 70–99)
Glucose-Capillary: 83 mg/dL (ref 70–99)
Glucose-Capillary: 95 mg/dL (ref 70–99)
Glucose-Capillary: 99 mg/dL (ref 70–99)

## 2021-06-27 NOTE — Progress Notes (Addendum)
This chaplain understands the notary is contacting witnesses for the notarizing of the Pt. Advance Directive.  **[phone]  This chaplain is present with the Pt., notary, and two witnesses for notarizing the Pt. Advance Directive:  HCPOA and Living Will.  This AD document will supercede all other documents.  The Pt. named Leola Fiore as her healthcare agent.  The Pt was given the original AD and one copy. The chaplain scanned the updated AD to the Pt. EMR.  This chaplain is available for F/U spiritual care. The chaplain updated the Pt. RN-Olivia and the SW-Carol. The chaplain updated the RN the Pt. has started eating her lunch.

## 2021-06-27 NOTE — Progress Notes (Signed)
[facility] PROGRESS NOTE  Holly Bradley OBS:[id number] DOB: [birth year] DOA: 05/15/2021 PCP: Mitzi Hansen, MD  Status: Remains inpatient appropriate because:Altered mental status, Unsafe d/c plan, and Inpatient level of care appropriate due to severity of illness  Dispo: The patient is from: Home              Anticipated d/c is to: SNF and eventually home with brother              Patient currently is medically stable to d/c.  Difficult to place patient Yes              Barriers to discharge: Patient has [place] Medicaid which must be canceled before she can be eligible for [facility].  Without [place] Medicaid she is not eligible to be placed in a skilled nursing facility  Level of care: Med-Surg  Code Status: Full code except for no CPR Family Communication: Patient only DVT prophylaxis: SCDs with history of hemorrhagic stroke COVID vaccination status: Unknown    HPI: 45 y.o. female past medical history of essential hypertension, diabetes mellitus type 2 large uterine fibroids paroxysmal SVT, chronic diastolic heart failure paranoid psychosis comes into the ED on 05/16/2019 with confusion was found to have a right MCA occlusion underwent thrombectomy which was complicated by contrast extravasation and M3 coiling embolization and revascularization.  She subsequently developed subarachnoid hemorrhage repeat imaging revealed right basal ganglia infarct with a 3 mm left shift, she was treated with hypertonic saline in the ICU, on 05/17/2021 she developed respiratory distress and he was intubated CT of the head showed increased cytotoxic edema and left side shift.  No mental status improvement is now trach and with nasal collar track for tube feedings.  She has been treated with Ancef and completed a 7-day course for MSSA pneumonia, her blood cultures on 05/21/2021 were positive for staph epi.  Infectious disease was consulted and are on board she has remained off antibiotics  since 2022 repeated blood cultures on 06/02/2021 have remained negative.  5/24 Admit to neurology after ED visit with AMS, found to have R MCA stroke, underwent mechanical thrombectomy, M3 coil embolization, subsequent SAH 5/26 decreased mental status and respiratory distress, intubated 6/1 still not candidate for extubation with poor mentation and cough mechanics. Family unsure if pt would want trach. Staph epi bacteremia, continues on ancef 6/2 moving RUE RLE following some commands. decision to move forward with tracheostomy.   6/3 perc trach. Slight WBC increase but no fever, continues on ancef 6/4 Tolerating PSV/CPAP 6/5 WBC up to 17, last day of ancef for staph pna. Trying ATC  6/9 ID consulted for staph epi bacteremia . Felt contamination and stopped abx. 6/12 had fever spikes but nothing to explain  6/13 change to cuffless trach  6/24 downsized to 4.0 cuffless trach and capping trials initiate 6/26 decannulated successfully 6/30 cortrack tube removed and tube feedings discontinued  Subjective: Awake.  Smiling.  Did speak briefly to CM while she was at bedside.  Denied any specific complaints.  Objective: Vitals:   06/27/21 0316 06/27/21 0755  BP: 140/75 (!) 152/65  Pulse: 96 81  Resp: 17 16  Temp: 98.5 F (36.9 C) 98.4 F (36.9 C)  SpO2: 99% 99%    Intake/Output Summary (Last 24 hours) at 06/27/2021 0800 Last data filed at 06/27/2021 0542 Gross per 24 hour  Intake --  Output 300 ml  Net -300 ml    Filed Weights   06/24/21 0500 06/25/21 0500 06/26/21  0500  Weight: 45.7 kg 48.1 kg 70.6 kg    Exam:  Constitutional: Awake, pleasant in no acute distress Respiratory: Lungs remain clear, stable on room air, no increased work of breathing. Cardiovascular: Heart sounds, normotensive, no peripheral edema Abdomen:  LBM 7/5, nontender.  Normoactive bowel sounds.  Tolerating diet with good intake Neurologic: CN 2-12 grossly intact except for left facial droop when smiling.     Psychiatric: Awake and oriented x3.  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 Continue aspirin and Lipitor. Consistent progress documented with therapies Continue LUE splint as recommended by therapies  Cytotoxic cerebral edema/hypernatremia Initially treated with 3% saline  Sodium has normalized  Left lower quadrant abdominal pain/suspected diverticulitis and proctocolitis Resolved 6/20 CT abdomen pelvis: proctocolitis. Symptoms rapidly improved after the administration of NSAIDs and it is suspected pain was secondary to menstrual pain Completed Proctofoam x7 days  Chronic constipation Cont Chronulac 10 g BID  Hypertension/grade 2 diastolic dysfunction Echocardiogram this admission reveals preserved LVEF with grade 2 diastolic dysfunction Continue carvedilol, hydralazine, Isordil, Entresto, Catapres and Norvasc  Dysphagia due to recent stroke Stable on D3 diet with thin liquids.   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   Hgb remained stable during recent menses  Protein calorie malnutrition Body mass index is 25.9 kg/m.  Continue dysphagia 1 diet with nectar thick liquids Continue nocturnal tube feedings with Jevity and Prosource per tube  History of paroxysmal SVT: Continue home amiodarone dose.  Fever of unknown source/resolved: Blood cultures on 5/30 with staph epidermis to have hominis-both likely contaminants Repeat blood cultures on 6/6 with staph epidermis Repeat blood cultures on 6/11  negative  Thrombocytosis: Resolved Likely acute phase reactant with peak platelets 531,000 on 6/16 and as of 6/24 platelets have normalized to 207,000   Data Reviewed: Basic Metabolic Panel: Recent Labs  Lab 06/22/21 0318 06/23/21 0335  NA 138 137  K 4.3 4.4  CL 110 111  CO2 18* 18*  GLUCOSE 91 98  BUN 26* 26*  CREATININE 1.11* 1.08*  CALCIUM 9.9 9.4   Liver Function Tests: No results for input(s): AST, ALT, ALKPHOS, BILITOT, PROT, ALBUMIN in the last 168 hours.  No results for input(s): LIPASE, AMYLASE in the last 168 hours. No results for input(s): AMMONIA in the last 168 hours. CBC: Recent Labs  Lab 06/21/21 0426 06/22/21 0318 06/23/21 0335  WBC 4.7 3.8* 4.3  NEUTROABS 3.5 3.2 3.2  HGB 8.7* 8.2* 8.7*  HCT 28.2* 26.4* 27.4*  MCV 83.2 83.3 83.3  PLT 181 187 200    Cardiac Enzymes: No results for input(s): CKTOTAL, CKMB, CKMBINDEX, TROPONINI in the last 168 hours. BNP (last 3 results) No results for input(s): BNP in the last 8760 hours.  ProBNP (last 3 results) No results for input(s): PROBNP in the last 8760 hours.  CBG: Recent Labs  Lab 06/26/21 1122 06/26/21 1624 06/26/21 2007 06/27/21 0043 06/27/21 0342  GLUCAP 115* 105* 140* 95 170*    No results found for this or any previous visit (from the past 240 hour(s)).   Studies: No results found.  Scheduled Meds:  amiodarone  200 mg Oral Daily  amLODipine  10 mg Oral Daily   aspirin  81 mg Oral Daily   atorvastatin  40 mg Oral Daily   carvedilol  25 mg Oral BID WC   chlorhexidine gluconate (MEDLINE KIT)  15 mL Mouth Rinse BID   Chlorhexidine Gluconate Cloth  6 each Topical Daily   cloNIDine  0.1 mg Oral TID   feeding supplement  237 mL Oral TID BM   ferrous sulfate  325 mg Oral BID WC   hydrALAZINE  100 mg Oral Q8H   hydrocortisone-pramoxine  1 applicator Rectal BID   isosorbide dinitrate  40 mg Oral TID   lactulose  10 g Oral BID   mouth rinse  15 mL Mouth Rinse 5 X Daily   megestrol   80 mg Oral TID   multivitamin with minerals  1 tablet Oral Daily   pantoprazole  40 mg Oral Daily   sacubitril-valsartan  1 tablet Oral BID   sodium chloride flush  10-40 mL Intracatheter Q12H   Continuous Infusions:    Principal Problem:   Cerebrovascular accident (CVA) ([place]) Active Problems:   Acute respiratory failure with hypoxia (HCC)   Fibroid   Iron deficiency anemia due to chronic blood loss   Cerebral edema (HCC)   Chronic HFrEF (heart failure with reduced ejection fraction) (HCC)   Pneumonia due to methicillin susceptible Staphylococcus aureus (HCC)   Abdominal distention   Encounter for feeding tube placement   Encounter for intubation   Ileus (HCC)   Staphylococcus epidermidis bacteremia   Status post tracheostomy ([place])   Abnormal uterine bleeding (AUB)   Hemorrhagic stroke (HCC)   Hemiparesis (HCC)   Constipation   Uterine fibroid   Left hemiparesis ([place])   Diverticulitis of colon without hemorrhage   Proctocolitis   Consultants: PCCM Palliative medicine OB-GYN CIR General surgery Infectious disease  Procedures: Echocardiogram IR transcatheter embolization on 5/24 EEG IR placement of tracheostomy on 6/3  Antibiotics: Cefazolin 5/30 through 6/5   Time spent: 45 minutes    Erin Hearing ANP  [facility] 7 am - 330 pm/M-F for direct patient care and secure chat Please refer to Amion for contact info 43  days

## 2021-06-27 NOTE — Progress Notes (Signed)
Physical Therapy Treatment Patient Details Name: Holly Bradley MRN: [id number] DOB: 1976-10-08 Today's Date: 06/27/2021    History of Present Illness 45 yo female s/p mechanical thrombectomy on 5/24 of right MCA M1/M2/M3 and embolization of right MCA M3 branch due to contrast extravasation. Pt re-intubated 5/26. Repeat head CT on 5/26 showed increased cytotoxic edema and mildly increased leftward midline shift, now 5 mm, decreased SAH and IVH. Abdominal CT showing 22.9 cm mass, possibly consistent with uterine fibroids, no lymphadenopathy. s/p trach 6/3,  decannulated 6/26, and Trach removed 6/30. PMH fibriods HTN HF paranoid psychosis recent hospitalizations january and february 2022Trach removed 6/30.    PT Comments    Patient continues to progress towards physical therapy goals. Patient requires maxA+2 for all mobility. Worked on dynamic reaching superior/inferior and L/R with minA. 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 Continue to recommend SNF for ongoing Physical Therapy.      Follow Up Recommendations  SNF     Equipment Recommendations  Wheelchair (measurements PT);Wheelchair cushion (measurements PT);Hospital bed;Other (comment) (hoyer lfit)    Recommendations for Other Services       Precautions / Restrictions Precautions Precautions: Fall;Other (comment) Precaution Comments: L hemiparesis, PRAFO,  resting hand splint, sublux to L shoulder Required Braces or Orthoses: Other Brace Other Brace: L resting hand splint (4 hours on/4 hours off) Restrictions Weight Bearing Restrictions: No    Mobility  Bed Mobility Overal bed mobility: Needs Assistance Bed Mobility: Supine to Sit     Supine to sit: Max assist;+2 for physical assistance     General bed mobility comments: Max A to move feet to EOB and elevate trunk.    Transfers Overall transfer level: Needs assistance Equipment used: 2 person hand held  assist Transfers: Sit to/from [facility] Sit to Stand: Max assist;+2 physical assistance;+2 safety/equipment Stand pivot transfers: Max assist;+2 physical assistance;+2 safety/equipment       General transfer comment: Difficulty weight shifting or advancing feet. Ultimately maxA+2 to complete stand pivot transfer  Ambulation/Gait                 Stairs             Wheelchair Mobility    Modified Rankin (Stroke Patients Only) Modified Rankin (Stroke Patients Only) Pre-Morbid Rankin Score: No symptoms Modified Rankin: Severe disability     Balance Overall balance assessment: Needs assistance Sitting-balance support: Single extremity supported;Feet supported Sitting balance-Leahy Scale: Poor Sitting balance - Comments: L lateral lean with up to maxA to maintain sitting balance Postural control: Posterior lean;Left lateral lean Standing balance support: Bilateral upper extremity supported Standing balance-Leahy Scale: Zero Standing balance comment: max-totalA+2 to maintain standing balance                            Cognition Arousal/Alertness: Awake/alert Behavior During Therapy: WFL for tasks assessed/performed Overall Cognitive Status: Impaired/Different from baseline Area of Impairment: Following commands;Attention;Safety/judgement;Problem solving;Awareness;Memory                   Current Attention Level: Selective Memory: Decreased short-term memory Following Commands: Follows one step commands with increased time Safety/Judgement: Decreased awareness of safety Awareness: Emergent Problem Solving: Slow processing;Requires verbal cues;Requires tactile cues;Difficulty sequencing;Decreased initiation General Comments: Patient with increased time to process commands.Patient with STM difficulty, emergent awarenes as indicated by reacting to pain and repositioning LUE.      Exercises Other Exercises Other Exercises:  dynamic reaching activity superior/inferior and L/R with R UE Other Exercises: seated crunches x 10    General Comments        Pertinent Vitals/Pain Pain Assessment: Faces Faces Pain Scale: Hurts little more Pain Location: L hand Pain Descriptors / Indicators: Tightness Pain Intervention(s): Monitored during session;Repositioned    Home Living                      Prior Function            PT Goals (current goals can now be found in the care plan section) Acute Rehab PT Goals Patient Stated Goal: get better PT Goal Formulation: With patient Time For Goal Achievement: 07/02/21 Potential to Achieve Goals: Fair Progress towards PT goals: Progressing toward goals    Frequency    Min 3X/week      PT Plan Current plan remains appropriate    Co-evaluation PT/OT/SLP Co-Evaluation/Treatment: Yes Reason for Co-Treatment: Complexity of the patient's impairments (multi-system involvement);For patient/therapist safety PT goals addressed during session: Mobility/safety with mobility;Balance OT goals addressed during session: ADL's and self-care      AM-PAC PT "6 Clicks" Mobility   Outcome Measure  Help needed turning from your back to your side while in a flat bed without using bedrails?: Total Help needed moving from lying on your back to sitting on the side of a flat bed without using bedrails?: Total Help needed moving to and from a bed to a chair (including a wheelchair)?: Total Help needed standing up from a chair using your arms (e.g., wheelchair or bedside chair)?: Total Help needed to walk in hospital room?: Total Help needed climbing 3-5 steps with a railing? : Total 6 Click Score: 6    End of Session Equipment Utilized During Treatment: Gait belt Activity Tolerance: Patient tolerated treatment well Patient left: in chair;with call bell/phone within reach;with chair alarm set Nurse Communication: Mobility status PT Visit Diagnosis: Hemiplegia and  hemiparesis;Other abnormalities of gait and mobility (R26.89);Unsteadiness on feet (R26.81);Muscle weakness (generalized) (M62.81);Difficulty in walking, not elsewhere classified (R26.2);Other symptoms and signs involving the nervous system (R29.898);Apraxia (R48.2) Hemiplegia - Right/Left: Left Hemiplegia - dominant/non-dominant: Non-dominant Hemiplegia - caused by: Cerebral infarction;Nontraumatic SAH;Nontraumatic intracerebral hemorrhage     Time: 1450-1516 PT Time Calculation (min) (ACUTE ONLY): 26 min  Charges:  $Therapeutic Activity: 8-22 mins                     Mckinnon Glick A. Gilford Rile PT, DPT Acute Rehabilitation Services Pager [phone] Office [phone]    Linna Hoff 06/27/2021, 5:18 PM

## 2021-06-27 NOTE — Progress Notes (Signed)
Occupational Therapy Treatment Patient Details Name: Holly Bradley MRN: [id number] DOB: 08/12/1976 Today's Date: 06/27/2021    History of present illness 45 yo female s/p mechanical thrombectomy on 5/24 of right MCA M1/M2/M3 and embolization of right MCA M3 branch due to contrast extravasation. Pt re-intubated 5/26. Repeat head CT on 5/26 showed increased cytotoxic edema and mildly increased leftward midline shift, now 5 mm, decreased SAH and IVH. Abdominal CT showing 22.9 cm mass, possibly consistent with uterine fibroids, no lymphadenopathy. s/p trach 6/3,  decannulated 6/26, and Trach removed 6/30. PMH fibriods HTN HF paranoid psychosis recent hospitalizations january and february 2022Trach removed 6/30.   OT comments  Patient with incremental progress toward patient focused OT goals.  Patient requires +2 for bed mobility, +2 for sit/stand and +2 for transfers.  She continues to present with the barriers below.  OT will continue to follow the patient in the acute setting to maximize her functional status.  SNF is an option, family would need to provide heavy +2 mod to max assist if home is the goal.    Follow Up Recommendations  SNF    Equipment Recommendations  Wheelchair (measurements OT);Wheelchair cushion (measurements OT);Hospital bed;3 in 1 bedside commode    Recommendations for Other Services      Precautions / Restrictions Precautions Precautions: Fall;Other (comment) Precaution Comments: L hemiparesis, PRAFO,  resting hand splint, sublux to L shoulder Other Brace: L resting hand splint (4 hours on/4 hours off) Restrictions Weight Bearing Restrictions: No       Mobility Bed Mobility Overal bed mobility: Needs Assistance Bed Mobility: Supine to Sit     Supine to sit: Max assist;+2 for physical assistance     General bed mobility comments: Max A to move feet to EOB and elevate trunk. Patient Response: Cooperative;Flat affect  Transfers Overall transfer level: Needs  assistance   Transfers: Sit to/from Stand;Stand Pivot Transfers Sit to Stand: +2 physical assistance;+2 safety/equipment;Max assist Stand pivot transfers: Max assist;+2 physical assistance;+2 safety/equipment       General transfer comment: unable to weight shift or move feet    Balance Overall balance assessment: Needs assistance Sitting-balance support: Single extremity supported;Feet supported Sitting balance-Leahy Scale: Poor   Postural control: Posterior lean;Left lateral lean Standing balance support: Bilateral upper extremity supported Standing balance-Leahy Scale: Zero                                                                                                                    Pertinent Vitals/ Pain       Pain Assessment: Faces Faces Pain Scale: Hurts little more Pain Location: L hand Pain Descriptors / Indicators: Tightness Pain Intervention(s): Monitored during session                                                          Frequency  Min 5X/week        Progress Toward Goals  OT Goals(current goals can now be found in the care plan section)  Progress towards OT goals: Progressing toward goals  Acute Rehab OT Goals Patient Stated Goal: get better OT Goal Formulation: With patient Time For Goal Achievement: 07/10/21 Potential to Achieve Goals: [place] Discharge plan needs to be updated    Co-evaluation    PT/OT/SLP Co-Evaluation/Treatment: Yes Reason for Co-Treatment: Complexity of the patient's impairments (multi-system involvement);For patient/therapist safety   OT goals addressed during session: ADL's and self-care      AM-PAC OT "6 Clicks" Daily Activity     Outcome Measure   Help from another person eating meals?: A Little Help from another person taking care of personal grooming?: A Little Help from another person toileting, which includes using toliet,  bedpan, or urinal?: A Lot Help from another person bathing (including washing, rinsing, drying)?: A Lot Help from another person to put on and taking off regular upper body clothing?: A Lot Help from another person to put on and taking off regular lower body clothing?: Total 6 Click Score: 13    End of Session Equipment Utilized During Treatment: Gait belt  OT Visit Diagnosis: Unsteadiness on feet (R26.81);Muscle weakness (generalized) (M62.81);Hemiplegia and hemiparesis Hemiplegia - Right/Left: Left Hemiplegia - dominant/non-dominant: Non-Dominant   Activity Tolerance Patient tolerated treatment well   Patient Left in chair;with call bell/phone within reach;with chair alarm set   Nurse Communication          Time: 2395-3202 OT Time Calculation (min): 31 min  Charges: OT General Charges $OT Visit: 1 Visit OT Treatments $Therapeutic Activity: 8-22 mins  06/27/2021  Rich, OTR/L  Acute Rehabilitation Services  Office:  [place] 06/27/2021, 4:02 PM

## 2021-06-27 NOTE — Progress Notes (Signed)
CSW faxed HCPOA documentation and letter requesting Medicaid termination to [place] state of [employer].  CSW sent secure e-mail to patient's brother [name] to provide him with a copy of HCPOA documentation.  Madilyn Fireman, MSW, LCSW Transitions of Care  Clinical Social Worker II [phone]

## 2021-06-28 ENCOUNTER — Inpatient Hospital Stay (HOSPITAL_COMMUNITY): Payer: Medicaid Other

## 2021-06-28 DIAGNOSIS — M7989 Other specified soft tissue disorders: Secondary | ICD-10-CM

## 2021-06-28 LAB — GLUCOSE, CAPILLARY
Glucose-Capillary: 109 mg/dL — ABNORMAL HIGH (ref 70–99)
Glucose-Capillary: 97 mg/dL (ref 70–99)
Glucose-Capillary: 119 mg/dL — ABNORMAL HIGH (ref 70–99)
Glucose-Capillary: 126 mg/dL — ABNORMAL HIGH (ref 70–99)

## 2021-06-28 LAB — CBC WITH DIFFERENTIAL/PLATELET
Abs Immature Granulocytes: 0 10*3/uL (ref 0.00–0.07)
Basophils Absolute: 0 10*3/uL (ref 0.0–0.1)
Basophils Relative: 0 %
Eosinophils Absolute: 0.1 10*3/uL (ref 0.0–0.5)
Eosinophils Relative: 1 %
HCT: 29.2 % — ABNORMAL LOW (ref 36.0–46.0)
Hemoglobin: 8.9 g/dL — ABNORMAL LOW (ref 12.0–15.0)
Lymphocytes Relative: 11 %
Lymphs Abs: 0.6 10*3/uL — ABNORMAL LOW (ref 0.7–4.0)
MCH: 25.9 pg — ABNORMAL LOW (ref 26.0–34.0)
MCHC: 30.5 g/dL (ref 30.0–36.0)
MCV: 85.1 fL (ref 80.0–100.0)
Monocytes Absolute: 0.2 10*3/uL (ref 0.1–1.0)
Monocytes Relative: 4 %
Neutro Abs: 4.5 10*3/uL (ref 1.7–7.7)
Neutrophils Relative %: 84 %
Platelets: 267 10*3/uL (ref 150–400)
RBC: 3.43 MIL/uL — ABNORMAL LOW (ref 3.87–5.11)
WBC: 5.3 10*3/uL (ref 4.0–10.5)
nRBC: 0 % (ref 0.0–0.2)
nRBC: 0 /100 WBC

## 2021-06-28 LAB — COMPREHENSIVE METABOLIC PANEL
ALT: 23 U/L (ref 0–44)
AST: 25 U/L (ref 15–41)
Albumin: 3 g/dL — ABNORMAL LOW (ref 3.5–5.0)
Alkaline Phosphatase: 137 U/L — ABNORMAL HIGH (ref 38–126)
Anion gap: 9 (ref 5–15)
BUN: 15 mg/dL (ref 6–20)
CO2: 19 mmol/L — ABNORMAL LOW (ref 22–32)
Calcium: 10.1 mg/dL (ref 8.9–10.3)
Chloride: 111 mmol/L (ref 98–111)
Creatinine, Ser: 1.01 mg/dL — ABNORMAL HIGH (ref 0.44–1.00)
GFR, Estimated: >60 mL/min (ref >60)
Glucose, Bld: 148 mg/dL — ABNORMAL HIGH (ref 70–99)
Potassium: 3.5 mmol/L (ref 3.5–5.1)
Sodium: 139 mmol/L (ref 135–145)
Total Bilirubin: 0.6 mg/dL (ref 0.3–1.2)
Total Protein: 7.1 g/dL (ref 6.5–8.1)

## 2021-06-28 LAB — SEDIMENTATION RATE: Sed Rate: 90 mm/hr — ABNORMAL HIGH (ref 0–22)

## 2021-06-28 LAB — C-REACTIVE PROTEIN: CRP: 0.7 mg/dL (ref <1.0)

## 2021-06-28 LAB — URIC ACID: Uric Acid, Serum: 5.9 mg/dL (ref 2.5–7.1)

## 2021-06-28 IMAGING — DX DG HAND COMPLETE 3+V*L*
3 series · 3 of 3 positions shown · non-contrast
Comparison: None.

CLINICAL DATA: Left hand pain and swelling.  History of stroke.

EXAM:
LEFT HAND - COMPLETE 3+ VIEW

[x hand pa left]
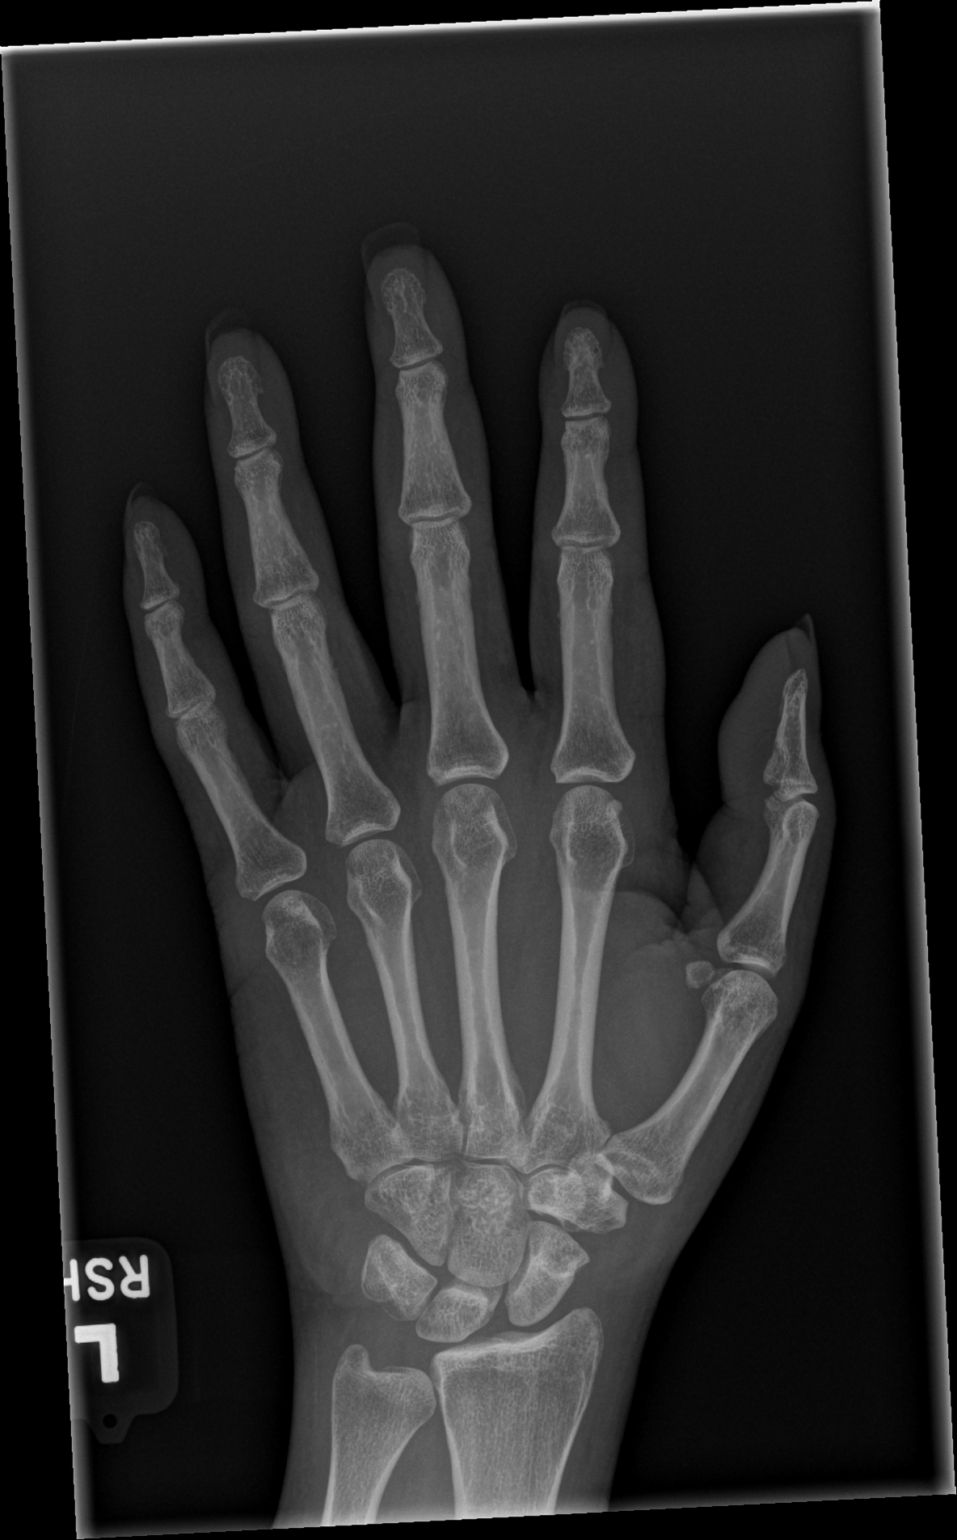

[x hand obl left]
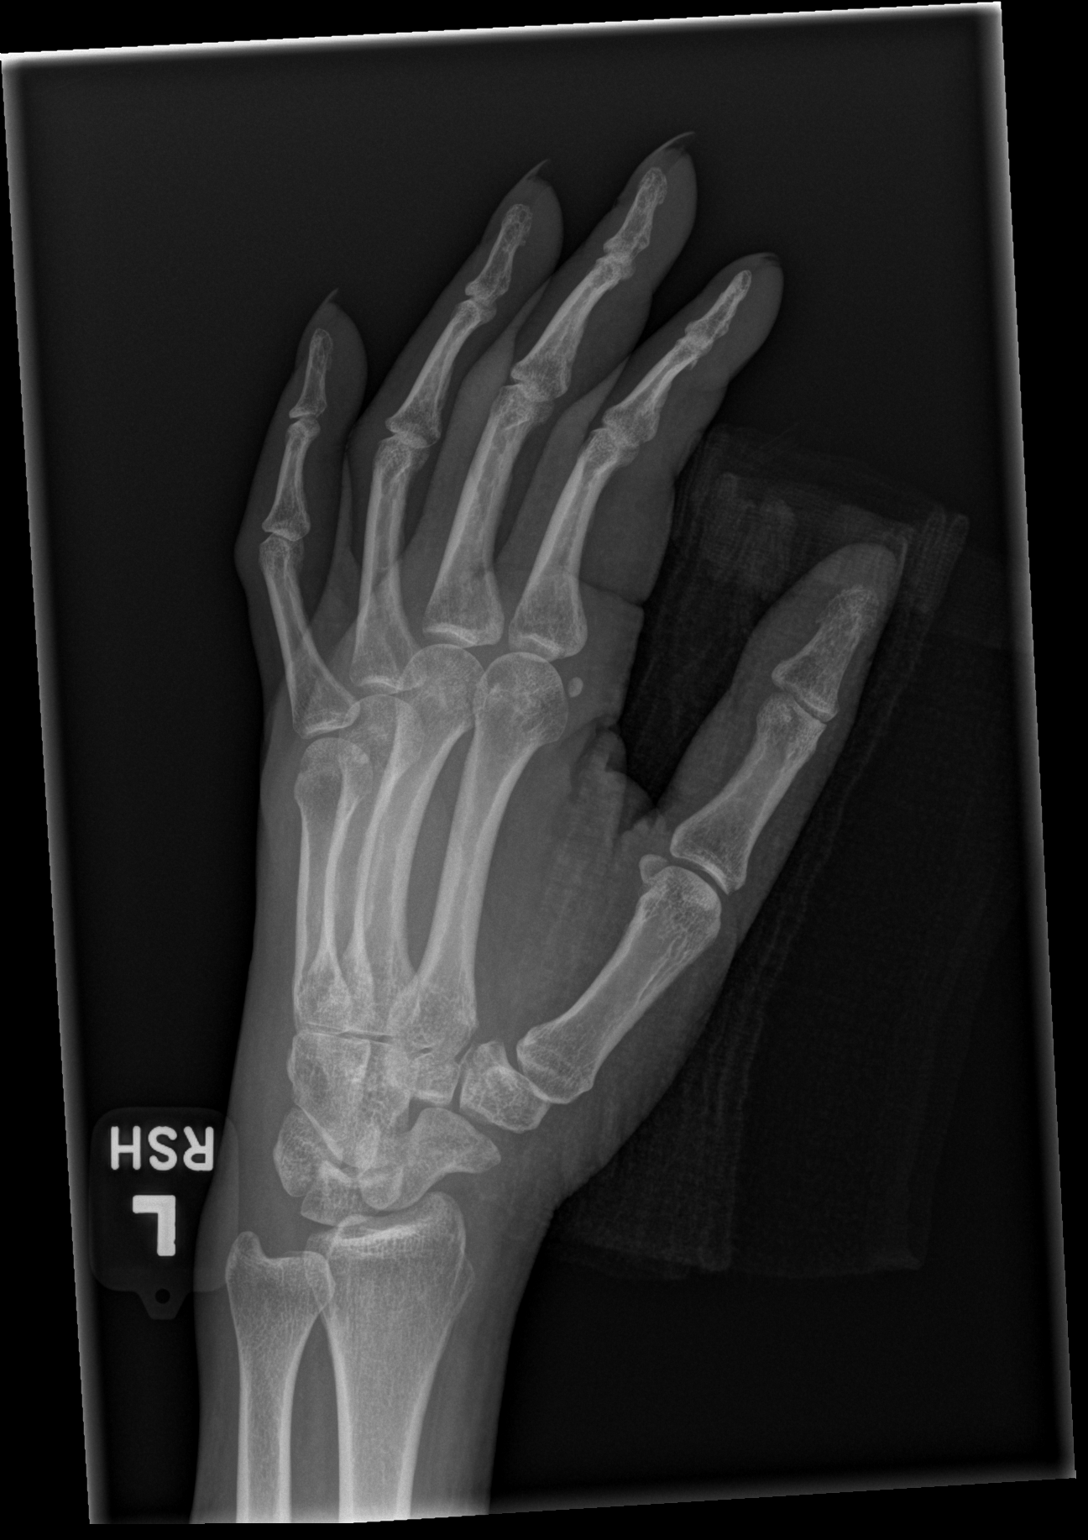

[x hand lat left]
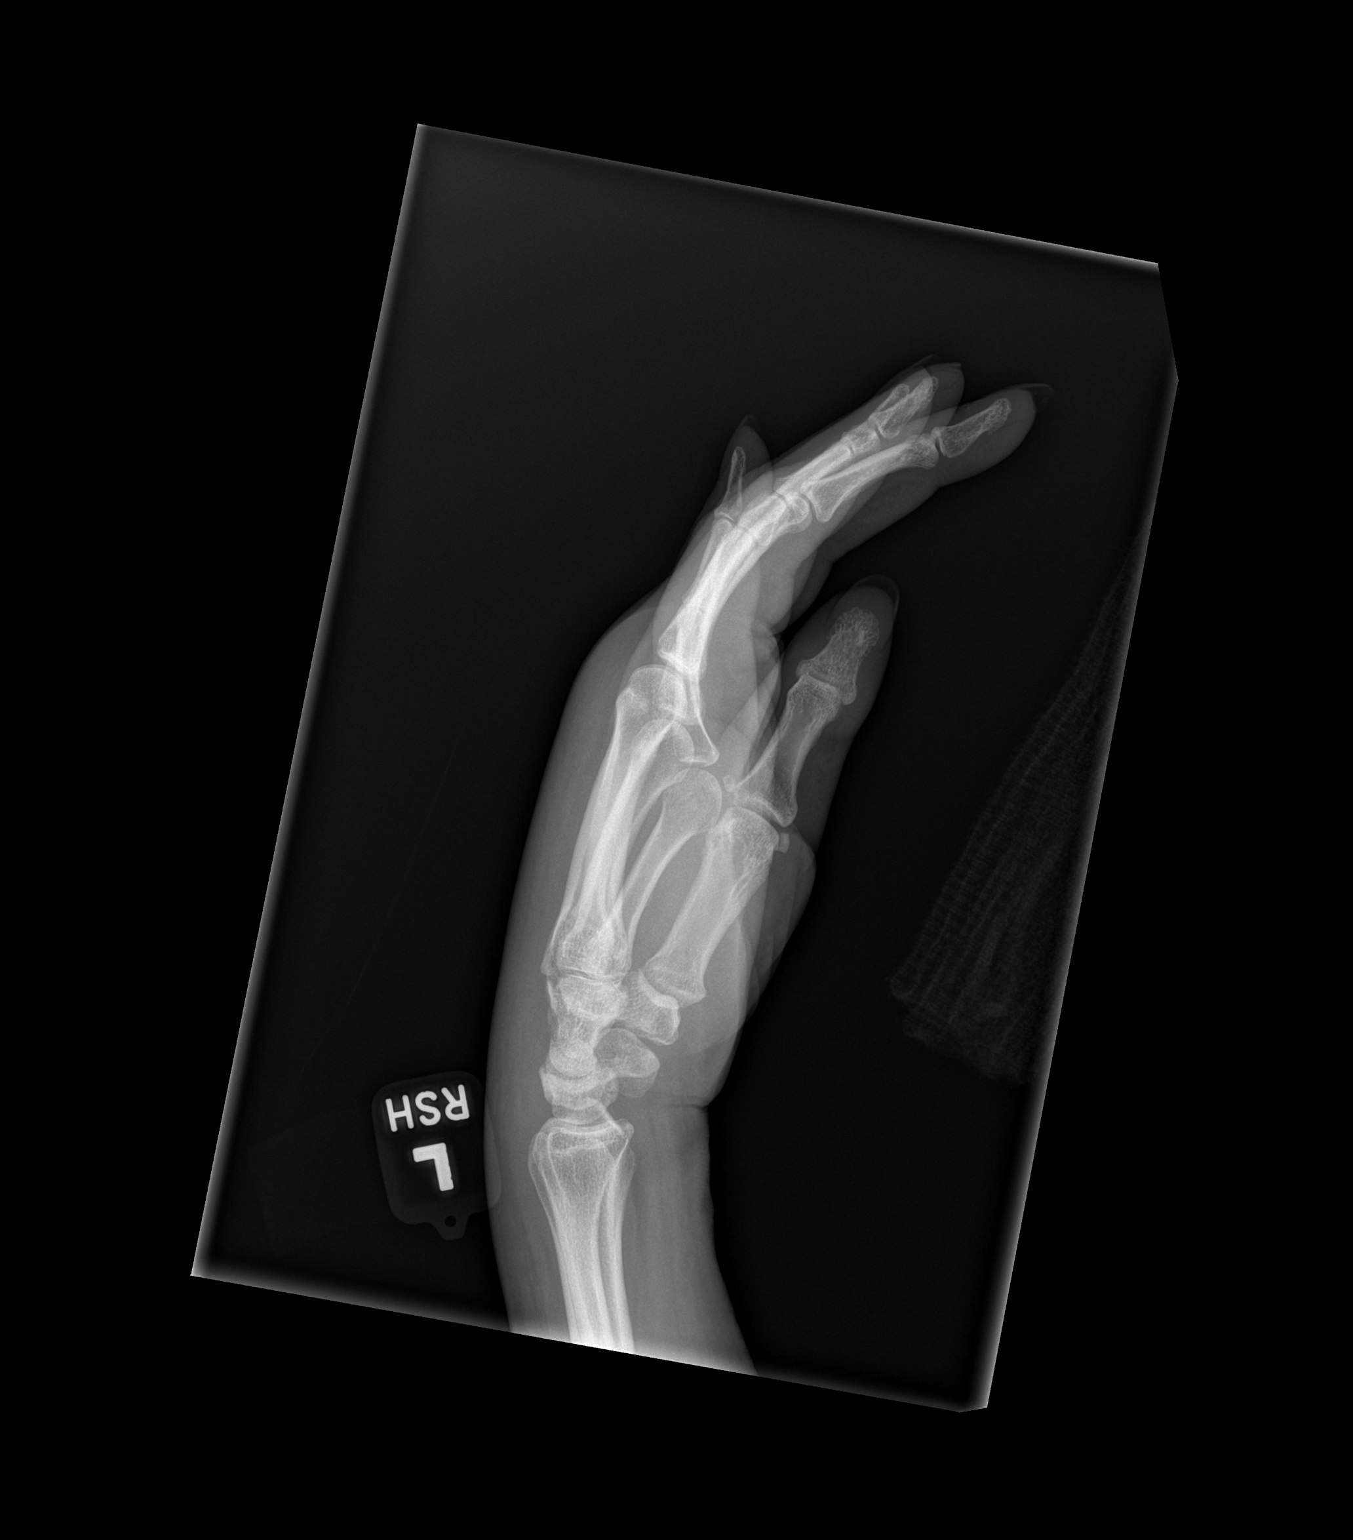

[3 of 3 positions shown; findings below may reference images not displayed]

FINDINGS: Nonspecific soft tissue swelling. No evidence of fracture,
dislocation, degenerative change or other focal finding.
IMPRESSION: Nonspecific soft tissue swelling of unclear etiology. No bone or
joint finding.

## 2021-06-28 MED ORDER — KETOROLAC TROMETHAMINE 10 MG PO TABS
10.0000 mg | ORAL_TABLET | Freq: Four times a day (QID) | ORAL | Status: DC
  Administered 2021-06-28 – 2021-06-29 (×4): 10 mg via ORAL
  Filled 2021-06-28 (×5): qty 1

## 2021-06-28 MED ORDER — KETOROLAC TROMETHAMINE 15 MG/ML IJ SOLN
15.0000 mg | Freq: Once | INTRAMUSCULAR | Status: AC
  Administered 2021-06-28: 15 mg via INTRAVENOUS
  Filled 2021-06-28: qty 1

## 2021-06-28 MED ORDER — OXYCODONE HCL 5 MG PO TABS
5.0000 mg | ORAL_TABLET | ORAL | Status: DC | PRN
  Administered 2021-06-28: 5 mg via ORAL
  Filled 2021-06-28: qty 1

## 2021-06-28 NOTE — Progress Notes (Addendum)
Occupational Therapy Treatment Patient Details Name: Holly Bradley MRN: [id number] DOB: 11/05/1976 Today's Date: 06/28/2021    History of present illness 45 yo female s/p mechanical thrombectomy on 5/24 of right MCA M1/M2/M3 and embolization of right MCA M3 branch due to contrast extravasation. Pt re-intubated 5/26. Repeat head CT on 5/26 showed increased cytotoxic edema and mildly increased leftward midline shift, now 5 mm, decreased SAH and IVH. Abdominal CT showing 22.9 cm mass, possibly consistent with uterine fibroids, no lymphadenopathy. s/p trach 6/3,  decannulated 6/26, and Trach removed 6/30. PMH fibriods HTN HF paranoid psychosis recent hospitalizations january and february 2022Trach removed 6/30.   OT comments  Patient seen without +2 this date.  Patient needing up to Max A for basic bed mobility, and setup to wash face, mod A to wash hands, and setup for self feeding at bed level.  Patient is demonstrating good progress with grooming and self feeding.  Barriers are listed below.  SNF is most appropriate, but family may be trying to take her home.  DME and appropriate assist will be needed for transition home.  OT is continuing efforts in the acute setting to maximize functional status.  Light stretching to L upper extremity, all pivots to tolerance.  Continued L shoulder sublux with tape applied.    Follow Up Recommendations  SNF    Equipment Recommendations  Wheelchair (measurements OT);Wheelchair cushion (measurements OT);Hospital bed;3 in 1 bedside commode    Recommendations for Other Services      Precautions / Restrictions Precautions Precautions: Fall;Other (comment) Precaution Comments: L hemiparesis, PRAFO,  resting hand splint, sublux to L shoulder Required Braces or Orthoses: Other Brace Other Brace: L resting hand splint (4 hours on/4 hours off) Restrictions Weight Bearing Restrictions: No       Mobility Bed Mobility Overal bed mobility: Needs Assistance Bed  Mobility: Rolling Rolling: Max assist         General bed mobility comments: able to push through RLE and grab HR with RUE Patient Response: Flat affect  Transfers                                                                 ADL either performed or assessed with clinical judgement   ADL   Eating/Feeding: Set up;Bed level   Grooming: Wash/dry face;Bed level;Set up                                                                                                                           Pertinent Vitals/ Pain       Pain Assessment: Faces Faces Pain Scale: Hurts even more Pain Location: L hand Pain Descriptors / Indicators: Tightness;Aching Pain Intervention(s): Monitored during session  Frequency     5x/wk      Progress Toward Goals  OT Goals(current goals can now be found in the care plan section)  Progress towards OT goals: Progressing toward goals  Acute Rehab OT Goals Patient Stated Goal: get better OT Goal Formulation: With patient Time For Goal Achievement: 07/10/21 Potential to Achieve Goals: [place] Discharge plan remains appropriate    Co-evaluation                 AM-PAC OT "6 Clicks" Daily Activity     Outcome Measure   Help from another person eating meals?: None Help from another person taking care of personal grooming?: A Little Help from another person toileting, which includes using toliet, bedpan, or urinal?: A Lot Help from another person bathing (including washing, rinsing, drying)?: A Lot Help from another person to put on and taking off regular upper body clothing?: A Lot Help from another person to put on and taking off regular lower body clothing?: Total 6 Click Score: 14    End of Session    OT Visit Diagnosis: Unsteadiness on feet (R26.81);Muscle  weakness (generalized) (M62.81);Hemiplegia and hemiparesis Hemiplegia - Right/Left: Left   Activity Tolerance Patient tolerated treatment well   Patient Left in bed;with call bell/phone within reach   Nurse Communication          Time: 0388-8280 OT Time Calculation (min): 25 min  Charges: OT General Charges $OT Visit: 1 Visit OT Treatments $Self Care/Home Management : 8-22 mins $Therapeutic Exercise: 8-22 mins  06/28/2021  Rich, OTR/L  Acute Rehabilitation Services  Office:  [phone]    Metta Clines 06/28/2021, 9:32 AM

## 2021-06-28 NOTE — Progress Notes (Signed)
RD Sign Off Note  Pt has been admitted to hospital for 44 days.   Admitting Dx: Stroke (cerebrum) ([place]) [I63.9] Cerebrovascular accident (CVA), unspecified mechanism ([place]) [I63.9] PMH:  Past Medical History:  Diagnosis Date   Hypertension    Medications:   amiodarone  200 mg Oral Daily   amLODipine  10 mg Oral Daily   aspirin  81 mg Oral Daily   atorvastatin  40 mg Oral Daily   carvedilol  25 mg Oral BID WC   chlorhexidine gluconate (MEDLINE KIT)  15 mL Mouth Rinse BID   Chlorhexidine Gluconate Cloth  6 each Topical Daily   cloNIDine  0.1 mg Oral TID   feeding supplement  237 mL Oral TID BM   ferrous sulfate  325 mg Oral BID WC   hydrALAZINE  100 mg Oral Q8H   isosorbide dinitrate  40 mg Oral TID   ketorolac  10 mg Oral Q6H   lactulose  10 g Oral BID   mouth rinse  15 mL Mouth Rinse 5 X Daily   megestrol  80 mg Oral TID   multivitamin with minerals  1 tablet Oral Daily   pantoprazole  40 mg Oral Daily   sacubitril-valsartan  1 tablet Oral BID   sodium chloride flush  10-40 mL Intracatheter Q12H   Labs: Recent Labs  Lab 06/22/21 0318 06/23/21 0335 06/28/21 1006  NA 138 137 139  K 4.3 4.4 3.5  CL 110 111 111  CO2 18* 18* 19*  BUN 26* 26* 15  CREATININE 1.11* 1.08* 1.01*  CALCIUM 9.9 9.4 10.1  GLUCOSE 91 98 148*   Weight: Wt Readings from Last 15 Encounters:  06/26/21 70.6 kg   Body mass index is 25.9 kg/m. Patient meets criteria for overweight based on current BMI.   Current diet order is dysphagia 3 w/ thin liquids; patient is consuming approximately 78% of meals at this time. Pt receiving Ensure and Magic Cup TID and, per RN, has been doing well with supplements. At this time, pt is medically stable for discharge per NP and is awaiting SNF placement.   No further nutrition interventions warranted at this time. If nutrition issues arise, please consult RD.   Larkin Ina, MS, RD, LDN (she/her/hers) RD pager number and weekend/on-call pager number located  in [place].

## 2021-06-28 NOTE — Progress Notes (Signed)
CSW spoke with [facility] [employer] [facility] to determine if the documents that were submitted yesterday were received. Prior to obtaining an answer, call was disconnected.  CSW returned call to [facility] and spoke with Joellyn Haff states the documents have not been put into the system yet but this process can take 7-10 business days. Richard states there are no other options for submitting the documents to expedite the process.  Madilyn Fireman, MSW, LCSW Transitions of Care  Clinical Social Worker II [phone]

## 2021-06-28 NOTE — Progress Notes (Addendum)
[facility] PROGRESS NOTE  Holly Bradley QIH:[id number] DOB: [birth year] DOA: 05/15/2021 PCP: Mitzi Hansen, MD  Status: Remains inpatient appropriate because:Altered mental status, Unsafe d/c plan, and Inpatient level of care appropriate due to severity of illness  Dispo: The patient is from: Home              Anticipated d/c is to: SNF and eventually home with brother              Patient currently is medically stable to d/c.  Difficult to place patient Yes              Barriers to discharge: Patient has [place] Medicaid which must be canceled before she can be eligible for [facility].  Without [place] Medicaid she is not eligible to be placed in a skilled nursing facility  Level of care: Med-Surg  Code Status: Full code except for no CPR Family Communication: Patient only DVT prophylaxis: SCDs with history of hemorrhagic stroke COVID vaccination status: Unknown    HPI: 45 y.o. female past medical history of essential hypertension, diabetes mellitus type 2 large uterine fibroids paroxysmal SVT, chronic diastolic heart failure paranoid psychosis comes into the ED on 05/16/2019 with confusion was found to have a right MCA occlusion underwent thrombectomy which was complicated by contrast extravasation and M3 coiling embolization and revascularization.  She subsequently developed subarachnoid hemorrhage repeat imaging revealed right basal ganglia infarct with a 3 mm left shift, she was treated with hypertonic saline in the ICU, on 05/17/2021 she developed respiratory distress and he was intubated CT of the head showed increased cytotoxic edema and left side shift.  No mental status improvement is now trach and with nasal collar track for tube feedings.  She has been treated with Ancef and completed a 7-day course for MSSA pneumonia, her blood cultures on 05/21/2021 were positive for staph epi.  Infectious disease was consulted and are on board she has remained off antibiotics  since 2022 repeated blood cultures on 06/02/2021 have remained negative.  5/24 Admit to neurology after ED visit with AMS, found to have R MCA stroke, underwent mechanical thrombectomy, M3 coil embolization, subsequent SAH 5/26 decreased mental status and respiratory distress, intubated 6/1 still not candidate for extubation with poor mentation and cough mechanics. Family unsure if pt would want trach. Staph epi bacteremia, continues on ancef 6/2 moving RUE RLE following some commands. decision to move forward with tracheostomy.   6/3 perc trach. Slight WBC increase but no fever, continues on ancef 6/4 Tolerating PSV/CPAP 6/5 WBC up to 17, last day of ancef for staph pna. Trying ATC  6/9 ID consulted for staph epi bacteremia . Felt contamination and stopped abx. 6/12 had fever spikes but nothing to explain  6/13 change to cuffless trach  6/24 downsized to 4.0 cuffless trach and capping trials initiate 6/26 decannulated successfully 6/30 cortrack tube removed and tube feedings discontinued  Subjective: Patient awake.  She was very verbal today and able to clearly tell me that her left hand was hurting very badly and was not being controlled with current dose of pain medication.  She stated the pain radiated from her hand all the way up to her shoulder with activity.  Objective: Vitals:   06/28/21 0338 06/28/21 0748  BP: (!) 150/70 (!) 152/71  Pulse: 78 85  Resp: 17 16  Temp: 99.1 F (37.3 C) 98.9 F (37.2 C)  SpO2: 96% 100%    Intake/Output Summary (Last 24 hours) at 06/28/2021 0809  Last data filed at 06/27/2021 2133 Gross per 24 hour  Intake 10 ml  Output 400 ml  Net -390 ml    Filed Weights   06/24/21 0500 06/25/21 0500 06/26/21 0500  Weight: 45.7 kg 48.1 kg 70.6 kg    Exam:  Constitutional: Alert and in mild to moderate distress secondary to severe left hand pain Respiratory: Lungs remain clear, stable on room air with normal pulse oximetry Cardiovascular: S1-S2, no  peripheral edema except for focal edema on dorsum of left hand involving the proximal MCP joints.  Pulse remains regular Abdomen:  LBM 7/5, soft and nontender with adequate oral intake.  Normoactive bowel sounds. Musculoskeletal: Patient with reproducible pain in left hand with minimal palpation over dorsum.  Noted with focal edema as documented above-no erythema or apparent effusion. Neurologic: CN 2-12 grossly intact except for left facial droop when smiling.    Psychiatric: Alert and oriented x3.  Much more verbal today and very concise and describing current pain.   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 Continue aspirin and Lipitor. Consistent progress documented with therapies Continue LUE splint as recommended by therapies  Cytotoxic cerebral edema/hypernatremia Initially treated with 3% saline  Sodium has normalized  Pain and swelling left hand Focal joint edema with tenderness about erythema Uric acid and CRP are normal No fever or leukocytosis X-ray hand with nonspecific soft tissue swelling Suspect unintended local trauma and patient with left hemiparesis after stroke. Creatinine and GFR normal so was given one-time dose of IV Toradol and will begin scheduled oral NSAIDs for 5 days with PPI-follow renal function while on scheduled NSAIDs Continue splint per OT-on 4 hours/off 4 hours  Left lower quadrant abdominal pain/suspected diverticulitis and proctocolitis Resolved 6/20 CT abdomen pelvis: proctocolitis. Symptoms rapidly improved after the administration of NSAIDs and it is suspected pain was secondary to menstrual pain Completed Proctofoam x7 days  Chronic constipation Cont Chronulac 10 g  BID  Hypertension/grade 2 diastolic dysfunction Echocardiogram this admission reveals preserved LVEF with grade 2 diastolic dysfunction Continue carvedilol, hydralazine, Isordil, Entresto, Catapres and Norvasc Remains euvolemic but watch for volume overload with scheduled NSAIDs  Dysphagia due to recent stroke Stable on D3 diet with thin liquids. 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   Hgb remained stable during recent menses  Protein calorie malnutrition Body mass index is 25.9 kg/m.  Continue dysphagia 1 diet with nectar thick liquids Continue nocturnal tube feedings with Jevity and Prosource per tube  History of paroxysmal SVT: Continue home amiodarone dose.  Fever of unknown source/resolved: Blood cultures on 5/30 with staph epidermis to have hominis-both likely contaminants Repeat blood cultures on 6/6 with staph epidermis Repeat blood cultures on 6/11 negative  Thrombocytosis: Resolved Likely acute phase reactant with peak platelets 531,000 on 6/16 and as of 6/24 platelets have normalized to 207,000   Data Reviewed: Basic Metabolic Panel: Recent Labs  Lab 06/22/21 0318 06/23/21 0335  NA 138 137  K 4.3 4.4  CL 110 111  CO2 18* 18*  GLUCOSE 91 98  BUN 26* 26*  CREATININE  1.11* 1.08*  CALCIUM 9.9 9.4   Liver Function Tests: No results for input(s): AST, ALT, ALKPHOS, BILITOT, PROT, ALBUMIN in the last 168 hours.  No results for input(s): LIPASE, AMYLASE in the last 168 hours. No results for input(s): AMMONIA in the last 168 hours. CBC: Recent Labs  Lab 06/22/21 0318 06/23/21 0335  WBC 3.8* 4.3  NEUTROABS 3.2 3.2  HGB 8.2* 8.7*  HCT 26.4* 27.4*  MCV 83.3 83.3  PLT 187 200    Cardiac  Enzymes: No results for input(s): CKTOTAL, CKMB, CKMBINDEX, TROPONINI in the last 168 hours. BNP (last 3 results) No results for input(s): BNP in the last 8760 hours.  ProBNP (last 3 results) No results for input(s): PROBNP in the last 8760 hours.  CBG: Recent Labs  Lab 06/27/21 1522 06/27/21 2033 06/27/21 2346 06/28/21 0336 06/28/21 0747  GLUCAP 147* 99 104* 109* 97    No results found for this or any previous visit (from the past 240 hour(s)).   Studies: No results found.  Scheduled Meds:  amiodarone  200 mg Oral Daily   amLODipine  10 mg Oral Daily   aspirin  81 mg Oral Daily   atorvastatin  40 mg Oral Daily   carvedilol  25 mg Oral BID WC   chlorhexidine gluconate (MEDLINE KIT)  15 mL Mouth Rinse BID   Chlorhexidine Gluconate Cloth  6 each Topical Daily   cloNIDine  0.1 mg Oral TID   feeding supplement  237 mL Oral TID BM   ferrous sulfate  325 mg Oral BID WC   hydrALAZINE  100 mg Oral Q8H   isosorbide dinitrate  40 mg Oral TID   lactulose  10 g Oral BID   mouth rinse  15 mL Mouth Rinse 5 X Daily   megestrol  80 mg Oral TID   multivitamin with minerals  1 tablet Oral Daily   pantoprazole  40 mg Oral Daily   sacubitril-valsartan  1 tablet Oral BID   sodium chloride flush  10-40 mL Intracatheter Q12H   Continuous Infusions:    Principal Problem:   Cerebrovascular accident (CVA) ([place]) Active Problems:   Acute respiratory failure with hypoxia (HCC)   Fibroid   Iron deficiency anemia due to chronic blood loss   Cerebral edema (HCC)   Chronic HFrEF (heart failure with reduced ejection fraction) (HCC)   Pneumonia due to methicillin susceptible Staphylococcus aureus (HCC)   Abdominal distention   Encounter for feeding tube placement   Encounter for intubation   Ileus (HCC)   Staphylococcus epidermidis bacteremia   Status post tracheostomy ([place])   Abnormal uterine bleeding (AUB)   Hemorrhagic stroke (HCC)   Hemiparesis (HCC)   Constipation   Uterine  fibroid   Left hemiparesis ([place])   Diverticulitis of colon without hemorrhage   Proctocolitis   Consultants: PCCM Palliative medicine OB-GYN CIR General surgery Infectious disease  Procedures: Echocardiogram IR transcatheter embolization on 5/24 EEG IR placement of tracheostomy on 6/3  Antibiotics: Cefazolin 5/30 through 6/5   Time spent: 25 minutes    Erin Hearing ANP  [facility] 7 am - 330 pm/M-F for direct patient care and secure chat Please refer to Amion for contact info 44  days

## 2021-06-29 LAB — BASIC METABOLIC PANEL
Anion gap: 7 (ref 5–15)
BUN: 23 mg/dL — ABNORMAL HIGH (ref 6–20)
CO2: 21 mmol/L — ABNORMAL LOW (ref 22–32)
Calcium: 10.2 mg/dL (ref 8.9–10.3)
Chloride: 113 mmol/L — ABNORMAL HIGH (ref 98–111)
Creatinine, Ser: 1.17 mg/dL — ABNORMAL HIGH (ref 0.44–1.00)
GFR, Estimated: 58 mL/min — ABNORMAL LOW (ref >60)
Glucose, Bld: 99 mg/dL (ref 70–99)
Potassium: 4.3 mmol/L (ref 3.5–5.1)
Sodium: 141 mmol/L (ref 135–145)

## 2021-06-29 MED ORDER — OXYCODONE HCL 5 MG PO TABS
7.5000 mg | ORAL_TABLET | ORAL | Status: DC | PRN
  Administered 2021-06-29 – 2021-07-03 (×9): 7.5 mg via ORAL
  Filled 2021-06-29 (×9): qty 2

## 2021-06-29 MED ORDER — PREDNISONE 50 MG PO TABS
50.0000 mg | ORAL_TABLET | Freq: Every day | ORAL | Status: DC
  Administered 2021-06-30 – 2021-07-03 (×4): 50 mg via ORAL
  Filled 2021-06-29 (×4): qty 1

## 2021-06-29 MED ORDER — METHYLPREDNISOLONE SODIUM SUCC 125 MG IJ SOLR
125.0000 mg | Freq: Once | INTRAMUSCULAR | Status: AC
  Administered 2021-06-29: 125 mg via INTRAVENOUS
  Filled 2021-06-29: qty 2

## 2021-06-29 NOTE — Progress Notes (Signed)
Occupational Therapy Treatment Patient Details Name: Holly Bradley MRN: [id number] DOB: 11/28/1976 Today's Date: 06/29/2021    History of present illness 45 yo female s/p mechanical thrombectomy on 5/24 of right MCA M1/M2/M3 and embolization of right MCA M3 branch due to contrast extravasation. Pt re-intubated 5/26. Repeat head CT on 5/26 showed increased cytotoxic edema and mildly increased leftward midline shift, now 5 mm, decreased SAH and IVH. Abdominal CT showing 22.9 cm mass, possibly consistent with uterine fibroids, no lymphadenopathy. s/p trach 6/3,  decannulated 6/26, and Trach removed 6/30. PMH fibriods HTN HF paranoid psychosis recent hospitalizations january and february 2022Trach removed 6/30.   OT comments  Patient with focus of treatment on PROM to L arm to all pivots.  Shoulder flexion restricted to 90 degrees.  Patient continues to have discomfort to L hand and wrist with stretch.  L hand splint placed.  Patient repositioned to right side for comfort and skin integrity.  SNF continues to be recommended unless family can provide 24 hour heavy care.  OT is following acutely.    Follow Up Recommendations  SNF    Equipment Recommendations  Wheelchair (measurements OT);Wheelchair cushion (measurements OT);Hospital bed;3 in 1 bedside commode    Recommendations for Other Services      Precautions / Restrictions Precautions Precautions: Fall Precaution Comments: L hemiparesis, PRAFO,  resting hand splint, sublux to L shoulder Required Braces or Orthoses: Other Brace Other Brace: L resting hand splint (4 hours on/4 hours off) Restrictions Weight Bearing Restrictions: No       Mobility Bed Mobility Overal bed mobility: Needs Assistance Bed Mobility: Rolling Rolling: Mod assist;Max assist         General bed mobility comments: Rolling to the L with more Mod A and rolling to the R with Max A Patient Response: Flat affect  Transfers                          Vision Baseline Vision/History: Wears glasses Wears Glasses: Reading only Additional Comments: patient is tracking much better, attends to the L visual field spontaneously.       Exercises General Exercises - Upper Extremity Shoulder Flexion: AROM;Right;15 reps Shoulder ABduction: AROM;Right;15 reps Elbow Flexion: AROM;Right;15 reps Elbow Extension: PROM;Both;Supine;15 reps Wrist Flexion: PROM;Left;10 reps;Seated Wrist Extension: PROM;Left;Seated;10 reps Digit Composite Flexion: PROM;Left;10 reps;Seated Composite Extension: PROM;Left;10 reps;Seated   Shoulder Instructions       General Comments      Pertinent Vitals/ Pain       Pain Assessment: Faces Faces Pain Scale: Hurts little more Pain Location: L hand Pain Descriptors / Indicators: Tightness;Aching Pain Intervention(s): Monitored during session                                                          Frequency  Min 5X/week        Progress Toward Goals  OT Goals(current goals can now be found in the care plan section)     Acute Rehab OT Goals Patient Stated Goal: Patient wanting to move better and not have pain to her L hand OT Goal Formulation: With patient Time For Goal Achievement: 07/10/21 Potential to Achieve Goals: [place] Discharge plan remains appropriate    Co-evaluation  AM-PAC OT "6 Clicks" Daily Activity     Outcome Measure   Help from another person eating meals?: None Help from another person taking care of personal grooming?: A Little Help from another person toileting, which includes using toliet, bedpan, or urinal?: A Lot Help from another person bathing (including washing, rinsing, drying)?: A Lot Help from another person to put on and taking off regular upper body clothing?: A Lot Help from another person to put on and taking off regular lower body clothing?: Total 6 Click Score: 14    End of Session    OT Visit  Diagnosis: Unsteadiness on feet (R26.81);Muscle weakness (generalized) (M62.81);Hemiplegia and hemiparesis Hemiplegia - Right/Left: Left Hemiplegia - dominant/non-dominant: Non-Dominant Hemiplegia - caused by: Cerebral infarction   Activity Tolerance Patient tolerated treatment well   Patient Left in bed;with call bell/phone within reach   Nurse Communication Other (comment) (repositioned to R side.)        Time: 1349-1406 OT Time Calculation (min): 17 min  Charges: OT General Charges $OT Visit: 1 Visit OT Treatments $Therapeutic Activity: 8-22 mins  06/29/2021  Rich, OTR/L  Acute Rehabilitation Services  Office:  [phone]    Metta Clines 06/29/2021, 2:14 PM

## 2021-06-29 NOTE — Progress Notes (Addendum)
12pm: CSW met with patient at bedside to introduce self - SLP present in room during discussion. CSW informed patient efforts being made to obtain placement in a facility for her - she verbalized understanding. CSW informed patient that her information was under review at two facilities at this time and that if a bed offer was obtained that she would be notified. Patient did not have any questions.  8am: Patient's clinical information is currently under review by administration at [facility] and [facility] for a possible bed offer.  Madilyn Fireman, MSW, LCSW Transitions of Care  Clinical Social Worker II [phone]

## 2021-06-29 NOTE — Progress Notes (Signed)
This chaplain is present at the Pt. bedside for F/U spiritual care.    The chaplain listen's reflectively as the Pt. speaks of [name]'s recent visit and her other brother's anticipated visit. The chaplain learns the Pt. is the older of the three siblings.   The chaplain understands the Pt. is aware [name] is working on transitioning the Pt. to SNF. The Pt. shares she has not met the social worker who is assisting [name], but is comforted by knowing someone is helping [name].  The chaplain is appreciative of the unit secretary's assistance with checking on the Pt. breakfast.  The Pt. is enjoying the [place] when the chaplain left.  This chaplain is available for F/U spiritual care as needed.

## 2021-06-29 NOTE — Progress Notes (Signed)
[facility] PROGRESS NOTE  Holly Bradley UDJ:[id number] DOB: [birth year] DOA: 05/15/2021 PCP: Mitzi Hansen, MD  Status: Remains inpatient appropriate because:Altered mental status, Unsafe d/c plan, and Inpatient level of care appropriate due to severity of illness  Dispo: The patient is from: Home              Anticipated d/c is to: SNF and eventually home with brother              Patient currently is medically stable to d/c.  Difficult to place patient Yes              Barriers to discharge: Patient has [place] Medicaid which must be canceled before she can be eligible for [facility].  Without [place] Medicaid she is not eligible to be placed in a skilled nursing facility  Level of care: Med-Surg  Code Status: Full code except for no CPR Family Communication: Patient only DVT prophylaxis: SCDs with history of hemorrhagic stroke COVID vaccination status: Unknown    HPI: 45 y.o. female past medical history of essential hypertension, diabetes mellitus type 2 large uterine fibroids paroxysmal SVT, chronic diastolic heart failure paranoid psychosis comes into the ED on 05/16/2019 with confusion was found to have a right MCA occlusion underwent thrombectomy which was complicated by contrast extravasation and M3 coiling embolization and revascularization.  She subsequently developed subarachnoid hemorrhage repeat imaging revealed right basal ganglia infarct with a 3 mm left shift, she was treated with hypertonic saline in the ICU, on 05/17/2021 she developed respiratory distress and he was intubated CT of the head showed increased cytotoxic edema and left side shift.  No mental status improvement is now trach and with nasal collar track for tube feedings.  She has been treated with Ancef and completed a 7-day course for MSSA pneumonia, her blood cultures on 05/21/2021 were positive for staph epi.  Infectious disease was consulted and are on board she has remained off antibiotics  since 2022 repeated blood cultures on 06/02/2021 have remained negative.  5/24 Admit to neurology after ED visit with AMS, found to have R MCA stroke, underwent mechanical thrombectomy, M3 coil embolization, subsequent SAH 5/26 decreased mental status and respiratory distress, intubated 6/1 still not candidate for extubation with poor mentation and cough mechanics. Family unsure if pt would want trach. Staph epi bacteremia, continues on ancef 6/2 moving RUE RLE following some commands. decision to move forward with tracheostomy.   6/3 perc trach. Slight WBC increase but no fever, continues on ancef 6/4 Tolerating PSV/CPAP 6/5 WBC up to 17, last day of ancef for staph pna. Trying ATC  6/9 ID consulted for staph epi bacteremia . Felt contamination and stopped abx. 6/12 had fever spikes but nothing to explain  6/13 change to cuffless trach  6/24 downsized to 4.0 cuffless trach and capping trials initiate 6/26 decannulated successfully 6/30 cortrack tube removed and tube feedings discontinued  Subjective: Patient awake.  Continues to report pain in her left hand.  Objective: Vitals:   06/29/21 0325 06/29/21 0811  BP: 137/79 139/68  Pulse: 71 80  Resp: 17 18  Temp: 98.8 F (37.1 C) 98.9 F (37.2 C)  SpO2: 99% 99%   No intake or output data in the 24 hours ending 06/29/21 0811   Filed Weights   06/25/21 0500 06/26/21 0500 06/29/21 0500  Weight: 48.1 kg 70.6 kg 69.2 kg    Exam:  Constitutional: Calm, appears to be in pain with palpation over right hand Respiratory: Lungs  remain clear, stable on room air, patient coughing while I was in the room and when I asked her if she felt like secretions were going down the wrong way she stated yes.  Nurse immediately notified so patient can be repositioned with head of bed higher Cardiovascular: S1-S2, no peripheral edema, regular pulse Abdomen:  LBM 7/8, continues to have palpable uterus secondary to massively enlarged fibroids.  Areas  surrounding the periphery of the fibroids soft and nontender. Musculoskeletal: Decreased edema left hand.  Remains tender to palpation but no redness or erythema. Neurologic: CN 2-12 grossly intact except for left facial droop when smiling. 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 Continue aspirin and Lipitor. Consistent progress documented with therapies Continue LUE splint as recommended by therapies  Cytotoxic cerebral edema/hypernatremia Initially treated with 3% saline  Sodium has normalized  Pain and swelling left hand No infectious or traumatic etiology found Likely related to patient's underlying dense hemiplegia and difficulty with appropriate arm placed OT notified to reevaluate current splint to ensure no difficulties with splint and to ensure staff aware of appropriate application process Started on Toradol with decreased swelling.  Given risk for GI symptoms as well as recent hemorrhagic CVA decision made to transition to steroids Will give Solu-Medrol IV 125 mg x 1 then begin prednisone 50 mg daily x5 doses Increase Oxy IR to 7.5 mg prn  Left lower quadrant abdominal pain/suspected diverticulitis and proctocolitis Resolved 6/20 CT abdomen pelvis: proctocolitis. Symptoms rapidly improved after the administration of NSAIDs and it is suspected pain was secondary to menstrual pain Completed Proctofoam x7 days  Chronic constipation Cont Chronulac 10 g BID  Hypertension/grade 2 diastolic dysfunction Echocardiogram this admission reveals preserved LVEF with grade 2 diastolic dysfunction Continue carvedilol, hydralazine, Isordil, Entresto, Catapres  and Norvasc Remains euvolemic but watch for volume overload with scheduled NSAIDs  Dysphagia due to recent stroke Stable on D3 diet with thin liquids. 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   Hgb remained stable during recent menses  Protein calorie malnutrition Body mass index is 25.39 kg/m.  Continue dysphagia 1 diet with nectar thick liquids Continue nocturnal tube feedings with Jevity and Prosource per tube  History of paroxysmal SVT: Continue home amiodarone dose.  Fever of unknown source/resolved: Blood cultures on 5/30 with staph epidermis to have hominis-both likely contaminants Repeat blood cultures on 6/6 with staph epidermis Repeat blood cultures on 6/11 negative  Thrombocytosis: Resolved Likely acute phase reactant with peak platelets 531,000 on 6/16 and as of 6/24 platelets have normalized to 207,000   Data Reviewed: Basic Metabolic Panel: Recent Labs  Lab 06/23/21 0335 06/28/21 1006 06/29/21 0330  NA 137 139 141  K 4.4 3.5 4.3  CL 111 111 113*  CO2 18* 19* 21*  GLUCOSE 98 148* 99  BUN 26* 15 23*  CREATININE 1.08* 1.01* 1.17*  CALCIUM 9.4 10.1 10.2   Liver Function Tests: Recent Labs  Lab 06/28/21 1006  AST 25  ALT 23  ALKPHOS 137*  BILITOT  0.6  PROT 7.1  ALBUMIN 3.0*    No results for input(s): LIPASE, AMYLASE in the last 168 hours. No results for input(s): AMMONIA in the last 168 hours. CBC: Recent Labs  Lab 06/23/21 0335 06/28/21 1006  WBC 4.3 5.3  NEUTROABS 3.2 4.5  HGB 8.7* 8.9*  HCT 27.4* 29.2*  MCV 83.3 85.1  PLT 200 267    Cardiac Enzymes: No results for input(s): CKTOTAL, CKMB, CKMBINDEX, TROPONINI in the last 168 hours. BNP (last 3 results) No results for input(s):  BNP in the last 8760 hours.  ProBNP (last 3 results) No results for input(s): PROBNP in the last 8760 hours.  CBG: Recent Labs  Lab 06/27/21 2346 06/28/21 0336 06/28/21 0747 06/28/21 1245 06/28/21 1619  GLUCAP 104* 109* 97 119* 126*    No results found for this or any previous visit (from the past 240 hour(s)).   Studies: DG Hand Complete Left  Result Date: 06/28/2021 CLINICAL DATA:  Left hand pain and swelling.  History of stroke. EXAM: LEFT HAND - COMPLETE 3+ VIEW COMPARISON:  None. FINDINGS: Nonspecific soft tissue swelling. No evidence of fracture, dislocation, degenerative change or other focal finding. IMPRESSION: Nonspecific soft tissue swelling of unclear etiology. No bone or joint finding. Electronically Signed   By: Nelson Chimes M.D.   On: 06/28/2021 13:08    Scheduled Meds:  amiodarone  200 mg Oral Daily   amLODipine  10 mg Oral Daily   aspirin  81 mg Oral Daily   atorvastatin  40 mg Oral Daily   carvedilol  25 mg Oral BID WC   chlorhexidine gluconate (MEDLINE KIT)  15 mL Mouth Rinse BID   Chlorhexidine Gluconate Cloth  6 each Topical Daily   cloNIDine  0.1 mg Oral TID   feeding supplement  237 mL Oral TID BM   ferrous sulfate  325 mg Oral BID WC   hydrALAZINE  100 mg Oral Q8H   isosorbide dinitrate  40 mg Oral TID   ketorolac  10 mg Oral Q6H   lactulose  10 g Oral BID   mouth rinse  15 mL Mouth Rinse 5 X Daily   megestrol  80 mg Oral TID   multivitamin with minerals  1 tablet Oral Daily   pantoprazole  40 mg Oral Daily   sacubitril-valsartan  1 tablet Oral BID   sodium chloride flush  10-40 mL Intracatheter Q12H   Continuous Infusions:    Principal Problem:   Cerebrovascular accident (CVA) ([place]) Active Problems:   Acute respiratory failure with hypoxia (HCC)   Fibroid   Iron deficiency anemia due to chronic blood loss   Cerebral edema (HCC)   Chronic HFrEF (heart failure with reduced ejection fraction) (HCC)   Pneumonia due to methicillin  susceptible Staphylococcus aureus (HCC)   Abdominal distention   Encounter for feeding tube placement   Encounter for intubation   Ileus (HCC)   Staphylococcus epidermidis bacteremia   Status post tracheostomy ([place])   Abnormal uterine bleeding (AUB)   Hemorrhagic stroke (HCC)   Hemiparesis (HCC)   Constipation   Uterine fibroid   Left hemiparesis (HCC)   Diverticulitis of colon without hemorrhage   Proctocolitis   Swelling of left hand   Consultants: PCCM Palliative medicine OB-GYN CIR General surgery Infectious disease  Procedures: Echocardiogram IR transcatheter embolization on 5/24 EEG IR placement of tracheostomy on 6/3  Antibiotics: Cefazolin 5/30 through 6/5   Time spent: 25 minutes    Erin Hearing ANP  [facility] 7  am - 330 pm/M-F for direct patient care and secure chat Please refer to Amion for contact info 45  days

## 2021-06-29 NOTE — Plan of Care (Signed)
  Problem: Education: Goal: Knowledge of disease or condition will improve Outcome: Progressing Goal: Knowledge of secondary prevention will improve Outcome: Progressing   Problem: Education: Goal: Knowledge of General Education information will improve Description: Including pain rating scale, medication(s)/side effects and non-pharmacologic comfort measures Outcome: Progressing   Problem: Health Behavior/Discharge Planning: Goal: Ability to manage health-related needs will improve Outcome: Progressing   Problem: Clinical Measurements: Goal: Ability to maintain clinical measurements within normal limits will improve Outcome: Progressing Goal: Will remain free from infection Outcome: Progressing Goal: Diagnostic test results will improve Outcome: Progressing Goal: Respiratory complications will improve Outcome: Progressing Goal: Cardiovascular complication will be avoided Outcome: Progressing   Problem: Activity: Goal: Risk for activity intolerance will decrease Outcome: Progressing   Problem: Nutrition: Goal: Adequate nutrition will be maintained Outcome: Progressing   Problem: Coping: Goal: Level of anxiety will decrease Outcome: Progressing   Problem: Elimination: Goal: Will not experience complications related to bowel motility Outcome: Progressing Goal: Will not experience complications related to urinary retention Outcome: Progressing   Problem: Pain Managment: Goal: General experience of comfort will improve Outcome: Progressing   Problem: Safety: Goal: Ability to remain free from injury will improve Outcome: Progressing   Problem: Skin Integrity: Goal: Risk for impaired skin integrity will decrease Outcome: Progressing

## 2021-06-29 NOTE — Progress Notes (Signed)
  Speech Language Pathology Treatment: Dysphagia  Patient Details Name: Holly Bradley MRN: [id number] DOB: 1976/04/16 Today's Date: 06/29/2021 Time: 8527-7824 SLP Time Calculation (min) (ACUTE ONLY): 30 min  Assessment / Plan / Recommendation Clinical Impression  SLP treatment session for dysphagia and cognition - spoke to RN and NT. RN reports pt consumed 100% of breakfast!  In discussion with pt, she reports discomfort when drinking due to frequent choking.  Reviewed MBS from 6/30 that indicated pt with intact sensation to aspiration c/b cough.  In discussion with pt regarding potential chin tuck on MBS - Pt denies conducting. Trials of thin using chin tuck - but this was not consistently helpful.  Discussion re: potential bolus flow cups took place - using 5 cc or 10 cc liquid tests, pt determine she would like 5 cc cup.  SLP obtained and pt demonstrated use - even attempting chin tuck- with mod I cues.  She demonstrates good awareness to her deficits/discomfort.  Pt admits to coughing even with  Ensures, etc - thus do not recommend nectar thick liquids.  Will follow up for dysphagia management and cog eval. Thanks.  HPI HPI: 45 yo female s/p mechanical thrombectomy on 5/24 of right MCA M1/M2/M3 and embolization of right MCA M3 branch due to contrast extravasation. Pt re-intubated 5/26. Repeat head CT on 5/26 showed increased cytotoxic edema and mildly increased leftward midline shift, now 5 mm, decreased SAH and IVH. Abdominal CT showing 22.9 cm mass, possibly consistent with uterine fibroids, no lymphadenopathy. s/p trach 6/3. MBS 6/21 and started on diet. Decannulated 6/26. Second MBS 6/30, diet advanced to dys3/thin. PMH fibroids HTN HF paranoid psychosis recent hospitalizations january and february 2022.  Pt has since started speaking and consuming po.      SLP Plan  Continue with current plan of care       Recommendations  Diet recommendations: Dysphagia 3 (mechanical soft);Thin  liquid Liquids provided via: Cup;Straw (provale cup) Medication Administration: Whole meds with puree Supervision: Patient able to self feed;Full supervision/cueing for compensatory strategies Compensations: Small sips/bites Postural Changes and/or Swallow Maneuvers: Seated upright 90 degrees                Oral Care Recommendations: Oral care BID Plan: Continue with current plan of care       GO             Holly Lime, MS [place] Office [phone] Pager [phone]    Holly Bradley 06/29/2021, 1:42 PM

## 2021-06-29 NOTE — Progress Notes (Signed)
Physical Therapy Treatment Patient Details Name: Holly Bradley MRN: [id number] DOB: 1976-01-18 Today's Date: 06/29/2021    History of Present Illness 45 yo female s/p mechanical thrombectomy on 5/24 of right MCA M1/M2/M3 and embolization of right MCA M3 branch due to contrast extravasation. Pt re-intubated 5/26. Repeat head CT on 5/26 showed increased cytotoxic edema and mildly increased leftward midline shift, now 5 mm, decreased SAH and IVH. Abdominal CT showing 22.9 cm mass, possibly consistent with uterine fibroids, no lymphadenopathy. s/p trach 6/3,  decannulated 6/26, and Trach removed 6/30. PMH fibriods HTN HF paranoid psychosis recent hospitalizations january and february 2022Trach removed 6/30.    PT Comments    Pt introduced to STAR program and pt agreeable to increased therapy. Plan for balance assessment, however unfortunately pt had just worked with OT and had increased fatigue resulting in need for increased assist today. Pt requires total A for coming to seated EoB, once there pt with complaints of increased L shoulder pain. Pt L UE placed in sling for support during therapy. Pt reports improved comfort. Worked on seated balance and finding midline. Pt noted to have R sided pushing which has not be noted for some time. Once returned to bed with total A. Positioned pt in chair position, which pt said was comfortable with pillows for support of L UE and for maintaining neutral alignment of L hip. Bed controls moved so that pt could be more independent in adjusting her environment. Pt very appreciative. NT educated that pt will likely fatigue with sitting so upright and may slump over to the R and not be able to adjust herself in the bed so if she could look in more frequently it would be appreciated. NT voiced understanding. Therapy staff with work to increase time between sessions or work together in one session daily. Although pt would greatly benefit from CIR level rehab, PT in agreement  with SNF level rehab at this point.     Follow Up Recommendations  SNF     Equipment Recommendations  Wheelchair (measurements PT);Wheelchair cushion (measurements PT);Hospital bed;Other (comment)       Precautions / Restrictions Precautions Precautions: Fall Precaution Comments: L hemiparesis, PRAFO,  resting hand splint, sublux to L shoulder Required Braces or Orthoses: Other Brace Other Brace: L resting hand splint (4 hours on/4 hours off) Restrictions Weight Bearing Restrictions: No    Mobility  Bed Mobility Overal bed mobility: Needs Assistance Bed Mobility: Rolling Rolling: Mod assist;Max assist   Supine to sit: Total assist;+2 for physical assistance Sit to supine: Total assist;+2 for physical assistance   General bed mobility comments: pt had just worked with OT with increased fatigue because she required increased assist and could not attempt out of bed due to level of assist required.       Modified Rankin (Stroke Patients Only) Modified Rankin (Stroke Patients Only) Pre-Morbid Rankin Score: No symptoms Modified Rankin: Severe disability     Balance Overall balance assessment: Needs assistance Sitting-balance support: Feet supported;Single extremity supported;No upper extremity supported Sitting balance-Leahy Scale: Zero Sitting balance - Comments: increased R sided pushing today                                    Cognition Arousal/Alertness: Awake/alert Behavior During Therapy: Flat affect Overall Cognitive Status: Impaired/Different from baseline Area of Impairment: Following commands;Attention;Safety/judgement;Problem solving;Awareness;Memory  Current Attention Level: Selective Memory: Decreased short-term memory Following Commands: Follows one step commands with increased time Safety/Judgement: Decreased awareness of safety Awareness: Emergent Problem Solving: Slow processing;Requires verbal cues;Requires  tactile cues;Difficulty sequencing;Decreased initiation General Comments: pt voices understanding of increased therapy, educated on using bed controls to provide more comfort, appreciative of having some control over her environment.         General Comments General comments (skin integrity, edema, etc.): VSS, pt verbalizes understanding of STAR program and increased therapy      Pertinent Vitals/Pain Pain Assessment: Faces Faces Pain Scale: Hurts even more Pain Location: L shoulder and hip Pain Descriptors / Indicators: Tightness;Aching Pain Intervention(s): Limited activity within patient's tolerance;Monitored during session;Repositioned     PT Goals (current goals can now be found in the care plan section) Acute Rehab PT Goals Patient Stated Goal: Patient wanting to move better and not have pain to her L hand PT Goal Formulation: With patient Time For Goal Achievement: 07/02/21 Potential to Achieve Goals: Fair Progress towards PT goals: Not progressing toward goals - comment (increased fatigue from working with OT)    Frequency    Min 5X/week      PT Plan Current plan remains appropriate       AM-PAC PT "6 Clicks" Mobility   Outcome Measure  Help needed turning from your back to your side while in a flat bed without using bedrails?: Total Help needed moving from lying on your back to sitting on the side of a flat bed without using bedrails?: Total Help needed moving to and from a bed to a chair (including a wheelchair)?: Total Help needed standing up from a chair using your arms (e.g., wheelchair or bedside chair)?: Total Help needed to walk in hospital room?: Total Help needed climbing 3-5 steps with a railing? : Total 6 Click Score: 6    End of Session Equipment Utilized During Treatment: Gait belt Activity Tolerance: Patient tolerated treatment well Patient left: in bed;with bed alarm set;with call bell/phone within reach Nurse Communication: Mobility  status;Other (comment) (request to check into make sure pt with good positioning) PT Visit Diagnosis: Hemiplegia and hemiparesis;Other abnormalities of gait and mobility (R26.89);Unsteadiness on feet (R26.81);Muscle weakness (generalized) (M62.81);Difficulty in walking, not elsewhere classified (R26.2);Other symptoms and signs involving the nervous system (R29.898);Apraxia (R48.2) Hemiplegia - Right/Left: Left Hemiplegia - dominant/non-dominant: Non-dominant Hemiplegia - caused by: Cerebral infarction;Nontraumatic SAH;Nontraumatic intracerebral hemorrhage     Time: 1740-8144 PT Time Calculation (min) (ACUTE ONLY): 33 min  Charges:  $Therapeutic Activity: 23-37 mins                     Nicle Connole B. Migdalia Dk PT, DPT Acute Rehabilitation Services Pager [phone] Office [phone]    [place] 06/29/2021, 4:27 PM

## 2021-06-30 LAB — BASIC METABOLIC PANEL
Anion gap: 7 (ref 5–15)
BUN: 27 mg/dL — ABNORMAL HIGH (ref 6–20)
CO2: 20 mmol/L — ABNORMAL LOW (ref 22–32)
Calcium: 10.1 mg/dL (ref 8.9–10.3)
Chloride: 112 mmol/L — ABNORMAL HIGH (ref 98–111)
Creatinine, Ser: 1.16 mg/dL — ABNORMAL HIGH (ref 0.44–1.00)
GFR, Estimated: 59 mL/min — ABNORMAL LOW (ref >60)
Glucose, Bld: 162 mg/dL — ABNORMAL HIGH (ref 70–99)
Potassium: 4.2 mmol/L (ref 3.5–5.1)
Sodium: 139 mmol/L (ref 135–145)

## 2021-06-30 LAB — GLUCOSE, CAPILLARY: Glucose-Capillary: 146 mg/dL — ABNORMAL HIGH (ref 70–99)

## 2021-06-30 NOTE — Progress Notes (Signed)
Subjective:  Complains of pain in the left wrist area.  Objective: Exam: Blood pressure (!) 121/55, pulse 71, temperature 98.2 F (36.8 C), temperature source Oral, resp. rate 18, height 5\' 5"  (1.651 m), weight 68.5 kg, SpO2 99 %.   Continues to have significant left-sided hemiplegia.  Pertinent labs: None today  Assessment/plan: 1.Dysphagia: Due to acute CVA-improved-SLP following.  No longer with NG tube. 2.Acute ischemic CVA s/p thrombectomy complicated by rupture of M3: Continues to have dense left-sided hemiplegia. 3.Acute hypoxic respiratory failure in the setting of acute CVA-MSSA pneumonia-required trach-now decannulated 4.Acute blood loss anemia from menorrhagia due to uterine fibroids: On Megace 5.Suspected diverticulitis/proctocolitis: On Cipro/Flagyl-no diarrhea-abdominal exam benign. 6.HTN: BP stable on amlodipine/Coreg/Isordil/Entresto 7.HFpEF: Euvolemic on exam-on Entresto/Coreg 8.History of SVT: On amiodarone 9.  Left wrist swelling/pain: Trial of steroids ongoing since 7/8-reassess on 7/10  Time spent 15 min

## 2021-06-30 NOTE — Plan of Care (Signed)
  Problem: Education: Goal: Knowledge of disease or condition will improve Outcome: Progressing Goal: Knowledge of secondary prevention will improve Outcome: Progressing   Problem: Education: Goal: Knowledge of General Education information will improve Description: Including pain rating scale, medication(s)/side effects and non-pharmacologic comfort measures Outcome: Progressing   Problem: Health Behavior/Discharge Planning: Goal: Ability to manage health-related needs will improve Outcome: Progressing   Problem: Clinical Measurements: Goal: Ability to maintain clinical measurements within normal limits will improve Outcome: Progressing Goal: Will remain free from infection Outcome: Progressing Goal: Diagnostic test results will improve Outcome: Progressing Goal: Respiratory complications will improve Outcome: Progressing Goal: Cardiovascular complication will be avoided Outcome: Progressing   Problem: Activity: Goal: Risk for activity intolerance will decrease Outcome: Progressing   Problem: Nutrition: Goal: Adequate nutrition will be maintained Outcome: Progressing   Problem: Coping: Goal: Level of anxiety will decrease Outcome: Progressing   Problem: Elimination: Goal: Will not experience complications related to bowel motility Outcome: Progressing Goal: Will not experience complications related to urinary retention Outcome: Progressing   Problem: Pain Managment: Goal: General experience of comfort will improve Outcome: Progressing   Problem: Safety: Goal: Ability to remain free from injury will improve Outcome: Progressing   Problem: Skin Integrity: Goal: Risk for impaired skin integrity will decrease Outcome: Progressing

## 2021-07-01 LAB — BASIC METABOLIC PANEL
Anion gap: 7 (ref 5–15)
BUN: 30 mg/dL — ABNORMAL HIGH (ref 6–20)
CO2: 21 mmol/L — ABNORMAL LOW (ref 22–32)
Calcium: 10 mg/dL (ref 8.9–10.3)
Chloride: 110 mmol/L (ref 98–111)
Creatinine, Ser: 1.09 mg/dL — ABNORMAL HIGH (ref 0.44–1.00)
GFR, Estimated: >60 mL/min (ref >60)
Glucose, Bld: 143 mg/dL — ABNORMAL HIGH (ref 70–99)
Potassium: 4.2 mmol/L (ref 3.5–5.1)
Sodium: 138 mmol/L (ref 135–145)

## 2021-07-01 NOTE — Progress Notes (Signed)
Subjective:  Left wrist pain better.  Hemiplegia-left-sided unchanged.  Objective: Exam: Blood pressure 132/71, pulse 72, temperature 98.9 F (37.2 C), temperature source Oral, resp. rate 20, height 5\' 5"  (1.651 m), weight 70.5 kg, SpO2 98 %.   Continues to have significant left-sided hemiplegia.  Pertinent labs: None today  Assessment/plan: 1.Dysphagia: Due to acute CVA-improved-SLP following.  No longer with NG tube. 2.Acute ischemic CVA s/p thrombectomy complicated by rupture of M3: Continues to have dense left-sided hemiplegia. 3.Acute hypoxic respiratory failure in the setting of acute CVA-MSSA pneumonia-required trach-now decannulated 4.Acute blood loss anemia from menorrhagia due to uterine fibroids: On Megace 5.Suspected diverticulitis/proctocolitis: On Cipro/Flagyl-no diarrhea-abdominal exam benign. 6.HTN: BP stable on amlodipine/Coreg/Isordil/Entresto 7.HFpEF: Euvolemic on exam-on Entresto/Coreg 8.History of SVT: On amiodarone 9.  Left wrist swelling/pain: Responding to steroids as of this morning-significant improvement-probably can continue prednisone for another few more days.  Time spent 15 min

## 2021-07-02 LAB — CBC
HCT: 31.9 % — ABNORMAL LOW (ref 36.0–46.0)
Hemoglobin: 9.7 g/dL — ABNORMAL LOW (ref 12.0–15.0)
MCH: 26.8 pg (ref 26.0–34.0)
MCHC: 30.4 g/dL (ref 30.0–36.0)
MCV: 88.1 fL (ref 80.0–100.0)
Platelets: 210 10*3/uL (ref 150–400)
RBC: 3.62 MIL/uL — ABNORMAL LOW (ref 3.87–5.11)
WBC: 8.8 10*3/uL (ref 4.0–10.5)
nRBC: 0.2 % (ref 0.0–0.2)

## 2021-07-02 NOTE — Progress Notes (Signed)
[facility] PROGRESS NOTE  Holly Bradley IWL:[id number] DOB: 02-Mar-1976 DOA: 05/15/2021 PCP: Mitzi Hansen, MD  Status: Remains inpatient appropriate because:Altered mental status, Unsafe d/c plan, and Inpatient level of care appropriate due to severity of illness  Dispo: The patient is from: Home              Anticipated d/c is to: SNF and eventually home with brother              Patient currently is medically stable to d/c.  Difficult to place patient Yes              Barriers to discharge: Patient has [place] Medicaid which must be canceled before she can be eligible for [facility].  Without [place] Medicaid she is not eligible to be placed in a skilled nursing facility  Level of care: Med-Surg  Code Status: Full code except for no CPR Family Communication: Patient only DVT prophylaxis: SCDs with history of hemorrhagic stroke COVID vaccination status: Unknown    HPI: 45 y.o. female past medical history of essential hypertension, diabetes mellitus type 2 large uterine fibroids paroxysmal SVT, chronic diastolic heart failure paranoid psychosis comes into the ED on 05/16/2019 with confusion was found to have a right MCA occlusion underwent thrombectomy which was complicated by contrast extravasation and M3 coiling embolization and revascularization.  She subsequently developed subarachnoid hemorrhage repeat imaging revealed right basal ganglia infarct with a 3 mm left shift, she was treated with hypertonic saline in the ICU, on 05/17/2021 she developed respiratory distress and he was intubated CT of the head showed increased cytotoxic edema and left side shift.  No mental status improvement is now trach and with nasal collar track for tube feedings.  She has been treated with Ancef and completed a 7-day course for MSSA pneumonia, her blood cultures on 05/21/2021 were positive for staph epi.  Infectious disease was consulted and are on board she has remained off antibiotics  since 2022 repeated blood cultures on 06/02/2021 have remained negative.  5/24 Admit to neurology after ED visit with AMS, found to have R MCA stroke, underwent mechanical thrombectomy, M3 coil embolization, subsequent SAH 5/26 decreased mental status and respiratory distress, intubated 6/1 still not candidate for extubation with poor mentation and cough mechanics. Family unsure if pt would want trach. Staph epi bacteremia, continues on ancef 6/2 moving RUE RLE following some commands. decision to move forward with tracheostomy.   6/3 perc trach. Slight WBC increase but no fever, continues on ancef 6/4 Tolerating PSV/CPAP 6/5 WBC up to 17, last day of ancef for staph pna. Trying ATC  6/9 ID consulted for staph epi bacteremia . Felt contamination and stopped abx. 6/12 had fever spikes but nothing to explain  6/13 change to cuffless trach  6/24 downsized to 4.0 cuffless trach and capping trials initiate 6/26 decannulated successfully 6/30 cortrack tube removed and tube feedings discontinued  Subjective: Awake and sitting up in bed.  Reports pain and swelling in left hand much better.  Requested a replaced ice pack of her hand.  Asked me to call the nurse that she has been incontinent of a large volume of urine just prior to me entering the room.  Objective: Vitals:   07/02/21 0403 07/02/21 0746  BP: 140/67 (!) 146/66  Pulse: 70 77  Resp: 20 18  Temp: 98.1 F (36.7 C) 97.9 F (36.6 C)  SpO2: 99% 100%    Intake/Output Summary (Last 24 hours) at 07/02/2021 0820 Last data  filed at 07/01/2021 2148 Gross per 24 hour  Intake 237 ml  Output --  Net 237 ml     Filed Weights   06/30/21 0427 07/01/21 0500 07/02/21 0500  Weight: 68.5 kg 70.5 kg 71.9 kg    Exam:  Constitutional: Pleasant with flat affect and in no acute distress Respiratory: Lungs remain clear, stable on room air. Cardiovascular: Normal heart sounds, no peripheral edema, regular pulse. Abdomen:  LBM 7/10, soft with  normoactive bowel sounds.  Eating much better now Musculoskeletal: Decreased pain and edema left hand.  Neurologic: CN 2-12 grossly intact except for left facial droop when smiling. 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 Continue aspirin and Lipitor. Consistent progress documented with therapies Continue LUE splint as recommended by therapies  Cytotoxic cerebral edema/hypernatremia Initially treated with 3% saline  Sodium has normalized  Pain and swelling left hand Significantly improved with use of scheduled prednisone for total of 5 days and ice packs Continue Oxy IR prn  Left lower quadrant abdominal pain/suspected diverticulitis and proctocolitis Resolved 6/20 CT question possibility of diverticulitis but clinical picture not consistent Completed 7 days of Proctofoam  Chronic constipation Cont Chronulac 10 g BID  Hypertension/grade 2 diastolic dysfunction Echocardiogram this admission reveals preserved LVEF with grade 2 diastolic dysfunction Continue carvedilol, hydralazine, Isordil, Entresto, Catapres and Norvasc Remains euvolemic but watch for volume overload with scheduled NSAIDs  Dysphagia due to recent stroke Stable on D3 diet with thin liquids. 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   Hgb remained stable during recent  menses  Protein calorie malnutrition Body mass index is 26.38 kg/m.  Continue dysphagia 1 diet with nectar thick liquids Continue nocturnal tube feedings with Jevity and Prosource per tube  History of paroxysmal SVT: Continue home amiodarone dose.  Fever of unknown source/resolved: Blood cultures on 5/30 with staph epidermis to have hominis-both likely contaminants Repeat blood cultures on 6/6 with staph epidermis Repeat blood cultures on 6/11 negative  Thrombocytosis: Resolved Likely acute phase reactant with peak platelets 531,000 on 6/16 and as of 6/24 platelets have normalized to 207,000   Data Reviewed: Basic Metabolic Panel: Recent Labs  Lab 06/28/21 1006 06/29/21 0330 06/30/21 0033 07/01/21 0254  NA 139 141 139 138  K 3.5 4.3 4.2 4.2  CL 111 113* 112* 110  CO2 19* 21* 20* 21*  GLUCOSE 148* 99 162* 143*  BUN 15 23* 27* 30*  CREATININE 1.01* 1.17* 1.16* 1.09*  CALCIUM 10.1 10.2 10.1 10.0   Liver Function Tests: Recent Labs  Lab 06/28/21 1006  AST 25  ALT 23  ALKPHOS 137*  BILITOT 0.6  PROT 7.1  ALBUMIN 3.0*    No results for input(s): LIPASE, AMYLASE in the last 168 hours. No results for input(s): AMMONIA in the last 168 hours. CBC: Recent Labs  Lab 06/28/21 1006 07/02/21 0330  WBC 5.3 8.8  NEUTROABS 4.5  --   HGB 8.9* 9.7*  HCT 29.2* 31.9*  MCV  85.1 88.1  PLT 267 210    Cardiac Enzymes: No results for input(s): CKTOTAL, CKMB, CKMBINDEX, TROPONINI in the last 168 hours. BNP (last 3 results) No results for input(s): BNP in the last 8760 hours.  ProBNP (last 3 results) No results for input(s): PROBNP in the last 8760 hours.  CBG: Recent Labs  Lab 06/28/21 0336 06/28/21 0747 06/28/21 1245 06/28/21 1619 06/30/21 0759  GLUCAP 109* 97 119* 126* 146*    No results found for this or any previous visit (from the past 240 hour(s)).   Studies: No results found.  Scheduled Meds:  amiodarone  200 mg Oral Daily   amLODipine  10 mg Oral  Daily   aspirin  81 mg Oral Daily   atorvastatin  40 mg Oral Daily   carvedilol  25 mg Oral BID WC   chlorhexidine gluconate (MEDLINE KIT)  15 mL Mouth Rinse BID   Chlorhexidine Gluconate Cloth  6 each Topical Daily   cloNIDine  0.1 mg Oral TID   feeding supplement  237 mL Oral TID BM   ferrous sulfate  325 mg Oral BID WC   hydrALAZINE  100 mg Oral Q8H   isosorbide dinitrate  40 mg Oral TID   lactulose  10 g Oral BID   mouth rinse  15 mL Mouth Rinse 5 X Daily   megestrol  80 mg Oral TID   multivitamin with minerals  1 tablet Oral Daily   pantoprazole  40 mg Oral Daily   predniSONE  50 mg Oral Q breakfast   sacubitril-valsartan  1 tablet Oral BID   sodium chloride flush  10-40 mL Intracatheter Q12H   Continuous Infusions:    Principal Problem:   Cerebrovascular accident (CVA) ([place]) Active Problems:   Acute respiratory failure with hypoxia (HCC)   Fibroid   Iron deficiency anemia due to chronic blood loss   Cerebral edema (HCC)   Chronic HFrEF (heart failure with reduced ejection fraction) (HCC)   Pneumonia due to methicillin susceptible Staphylococcus aureus (HCC)   Abdominal distention   Encounter for feeding tube placement   Encounter for intubation   Ileus (HCC)   Staphylococcus epidermidis bacteremia   Status post tracheostomy ([place])   Abnormal uterine bleeding (AUB)   Hemorrhagic stroke (HCC)   Hemiparesis (HCC)   Constipation   Uterine fibroid   Left hemiparesis (HCC)   Diverticulitis of colon without hemorrhage   Proctocolitis   Swelling of left hand   Consultants: PCCM Palliative medicine OB-GYN CIR General surgery Infectious disease  Procedures: Echocardiogram IR transcatheter embolization on 5/24 EEG IR placement of tracheostomy on 6/3  Antibiotics: Cefazolin 5/30 through 6/5   Time spent: 25 minutes    Erin Hearing ANP  [facility] 7 am - 330 pm/M-F for direct patient care and secure chat Please refer to Amion for contact  info 48  days

## 2021-07-02 NOTE — Progress Notes (Signed)
Occupational Therapy Treatment Patient Details Name: Holly Bradley MRN: [id number] DOB: 10-Nov-1976 Today's Date: 07/02/2021    History of present illness 45 yo female s/p mechanical thrombectomy on 5/24 of right MCA M1/M2/M3 and embolization of right MCA M3 branch due to contrast extravasation. Pt re-intubated 5/26. Repeat head CT on 5/26 showed increased cytotoxic edema and mildly increased leftward midline shift, now 5 mm, decreased SAH and IVH. Abdominal CT showing 22.9 cm mass, possibly consistent with uterine fibroids, no lymphadenopathy. s/p trach 6/3,  decannulated 6/26, and Trach removed 6/30. PMH fibriods HTN HF paranoid psychosis recent hospitalizations january and february 2022Trach removed 6/30.   OT comments  Pt progressing towards established OT goals. Pt motivated to participate in therapy. Performing bed mobility with max-total A. Pt sitting EOB 20 minutes with max-total A for sitting balance. Performing x4 Sit<>stand transfers using Stedy with total A. Performing manual therapy and PROM mobilization of LUE to reduce pain and increase functionality this session. Pt continues to present with L hemiparesis, decreased activity tolerance, sitting/standing balance, strength, coordination, and cognition. Recommend discharge with continued OT at SNF and will continue to follow acutely to optimize independence in ADLs.    Follow Up Recommendations  SNF    Equipment Recommendations  Wheelchair (measurements OT);Wheelchair cushion (measurements OT);Hospital bed;3 in 1 bedside commode    Recommendations for Other Services      Precautions / Restrictions Precautions Precautions: Fall Precaution Comments: L hemiparesis, PRAFO,  resting hand splint, sublux to L shoulder Required Braces or Orthoses: Other Brace Other Brace: L resting hand splint (4 hours on/4 hours off) Restrictions Weight Bearing Restrictions: No       Mobility Bed Mobility Overal bed mobility: Needs Assistance    Rolling: Mod assist;Max assist (Mod-Max A for rolling L and R to position bed pads)   Supine to sit: Total assist;+2 for physical assistance     General bed mobility comments: Pt motivated to participate in therapy. Pt requiring total A but making effot to assit self to EOB.    Transfers Overall transfer level: Needs assistance Equipment used: 2 person hand held assist Transfers: Sit to/from Stand Sit to Stand: Total assist;+2 physical assistance;+2 safety/equipment         General transfer comment: total A +2 for sit to stand. Pt holding weight in standing but unable to balance with heavy L lateral lean, requiring max cues to maintain midline.    Balance Overall balance assessment: Needs assistance Sitting-balance support: Feet supported;Single extremity supported;No upper extremity supported Sitting balance-Leahy Scale: Zero Sitting balance - Comments: Pt with L lateral lean, requiring max-total A to maintain sitting balance. Postural control: Posterior lean;Left lateral lean Standing balance support: Bilateral upper extremity supported Standing balance-Leahy Scale: Zero Standing balance comment: totalA+2 to maintain standing balance.                           ADL either performed or assessed with clinical judgement   ADL Overall ADL's : Needs assistance/impaired Eating/Feeding: Set up;Bed level Eating/Feeding Details (indicate cue type and reason): Pt eating lunch at end of session with bed in chair position. Grooming: Dance movement psychotherapist;Total assistance;Sitting Grooming Details (indicate cue type and reason): Pt washing face with RUE. Total A for sitting balance EOB                                     Vision  Vision Assessment?: Vision impaired- to be further tested in functional context   Perception     Praxis      Cognition Arousal/Alertness: Awake/alert Behavior During Therapy: Flat affect Overall Cognitive Status: Impaired/Different  from baseline Area of Impairment: Following commands;Attention;Safety/judgement;Problem solving;Awareness;Memory                   Current Attention Level: Sustained Memory: Decreased short-term memory Following Commands: Follows one step commands with increased time Safety/Judgement: Decreased awareness of safety Awareness: Emergent Problem Solving: Slow processing;Requires verbal cues;Requires tactile cues;Difficulty sequencing;Decreased initiation General Comments: Pt following commands with increased time. Pt tearful this session with pain in LUE, but agreeable to therapy session. Pt with emotional lability throughout session crying at seemingly inappropriate times even when making a positive statement or during general conversation. Pt requiring verbal and tactile cues for posture sitting EOB.        Exercises Exercises: Other exercises General Exercises - Upper Extremity Shoulder ABduction: PROM;Left;10 reps;Supine Elbow Flexion: PROM;10 reps;Left;Supine Shoulder Exercises Shoulder External Rotation: PROM;Left;10 reps;Supine Other Exercises Other Exercises: seated scapular retraction of R and L scapula. Facilitating movement of L scapula manually. Other Exercises: Manual therapy on L deltoid   Shoulder Instructions       General Comments Pt with emotional lability during session, unable to control her tears. Reporting she is in pain and wants to be able to do things. Pt agreeable to session and reporting she wants to participate in therapy.    Pertinent Vitals/ Pain       Pain Assessment: Faces Faces Pain Scale: Hurts whole lot Pain Location: L hand and shoulder Pain Descriptors / Indicators: Aching;Discomfort;Grimacing;Guarding;Crying;Tightness Pain Intervention(s): Limited activity within patient's tolerance;Monitored during session;Repositioned;Premedicated before session  Home Living                                          Prior  Functioning/Environment              Frequency  Min 5X/week        Progress Toward Goals  OT Goals(current goals can now be found in the care plan section)  Progress towards OT goals: Progressing toward goals  Acute Rehab OT Goals Patient Stated Goal: Patient wanting to move better and not have pain to her L hand OT Goal Formulation: With patient Time For Goal Achievement: 07/10/21 Potential to Achieve Goals: Fair ADL Goals Pt Will Perform Grooming: sitting;with mod assist Additional ADL Goal #1: pt will static sit with mod (A) as precursor to adls Additional ADL Goal #2: pt will tolerate L resting hand splint for up to 6 hours on Additional ADL Goal #3: Pt will perform basic transfer with mod A +2 as a precursor to ADLs  Plan Discharge plan remains appropriate    Co-evaluation    PT/OT/SLP Co-Evaluation/Treatment: Yes Reason for Co-Treatment: Complexity of the patient's impairments (multi-system involvement);For patient/therapist safety;To address functional/ADL transfers;Necessary to address cognition/behavior during functional activity PT goals addressed during session: Mobility/safety with mobility;Balance OT goals addressed during session: ADL's and self-care;Strengthening/ROM      AM-PAC OT "6 Clicks" Daily Activity     Outcome Measure   Help from another person eating meals?: A Little Help from another person taking care of personal grooming?: A Little Help from another person toileting, which includes using toliet, bedpan, or urinal?: A Lot Help from another person bathing (including washing, rinsing, drying)?:  A Lot Help from another person to put on and taking off regular upper body clothing?: A Lot Help from another person to put on and taking off regular lower body clothing?: Total 6 Click Score: 13    End of Session Equipment Utilized During Treatment: Gait belt  OT Visit Diagnosis: Unsteadiness on feet (R26.81);Muscle weakness (generalized)  (M62.81);Hemiplegia and hemiparesis Hemiplegia - Right/Left: Left Hemiplegia - dominant/non-dominant: Non-Dominant Hemiplegia - caused by: Cerebral infarction   Activity Tolerance Patient tolerated treatment well   Patient Left in bed;with call bell/phone within reach;with bed alarm set   Nurse Communication Mobility status        Time: 4975-3005 OT Time Calculation (min): 55 min  Charges: OT General Charges $OT Visit: 1 Visit OT Treatments $Therapeutic Activity: 23-37 mins  Shanda Howells, OTDS    Shanda Howells 07/02/2021, 4:48 PM

## 2021-07-02 NOTE — Plan of Care (Signed)
  Problem: Education: Goal: Knowledge of disease or condition will improve Outcome: Progressing Goal: Knowledge of secondary prevention will improve Outcome: Progressing   Problem: Education: Goal: Knowledge of General Education information will improve Description: Including pain rating scale, medication(s)/side effects and non-pharmacologic comfort measures Outcome: Progressing   Problem: Health Behavior/Discharge Planning: Goal: Ability to manage health-related needs will improve Outcome: Progressing   Problem: Clinical Measurements: Goal: Ability to maintain clinical measurements within normal limits will improve Outcome: Progressing Goal: Will remain free from infection Outcome: Progressing Goal: Diagnostic test results will improve Outcome: Progressing Goal: Respiratory complications will improve Outcome: Progressing Goal: Cardiovascular complication will be avoided Outcome: Progressing   Problem: Activity: Goal: Risk for activity intolerance will decrease Outcome: Progressing   Problem: Nutrition: Goal: Adequate nutrition will be maintained Outcome: Progressing   Problem: Elimination: Goal: Will not experience complications related to bowel motility Outcome: Progressing Goal: Will not experience complications related to urinary retention Outcome: Progressing   Problem: Coping: Goal: Level of anxiety will decrease Outcome: Progressing   Problem: Pain Managment: Goal: General experience of comfort will improve Outcome: Progressing   Problem: Safety: Goal: Ability to remain free from injury will improve Outcome: Progressing   Problem: Skin Integrity: Goal: Risk for impaired skin integrity will decrease Outcome: Progressing

## 2021-07-02 NOTE — Progress Notes (Signed)
PT Progress Note  Notes: Pt happy to see PT/OT but quickly becomes tearful reporting increased R shoulder pain. Focus of session balance in sitting, balance in standing, PROM of R UE for pain reduction. Pt continues to be flaccid on R side with feeling only in R flank and trunk. Pt requires total Ax3 to come to seated EoB, once there sling applied to R UE for increased comfort with balance work. Pt is total A for bed mobility and transfers using Stedy. Working towards static seated balance and able to maintain ~10 sec today, however improvement made on finding midline, chest expansion, spinal extension, and posterior tilt of pelvis in both seated and standing balance. Working towards goal of static balance to be able to transfer to sink on Stedy and perform self care. Current goals assessed and adjusted. Will continue cotreatment PT/OT due to level of assist and complexity of deficits.    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 Pt re-intubated 5/26. Repeat head CT on 5/26 showed increased cytotoxic edema and mildly increased leftward midline shift, now 5 mm, decreased SAH and IVH. Abdominal CT showing 22.9 cm mass, possibly consistent with uterine fibroids, no lymphadenopathy. s/p trach 6/3,  decannulated 6/26, and Trach removed 6/30. PMH fibriods HTN HF paranoid psychosis recent hospitalizations january and february 2022Trach removed 6/30.  Subjective Data  Patient Stated Goal Patient wanting to move better and not have pain to her L hand   Precautions  Precautions Fall  Precaution Comments L hemiparesis, PRAFO,  resting hand splint, sublux to L shoulder  Required Braces or Orthoses Other Brace  Other Brace L resting hand splint (4 hours on/4 hours off)  Restrictions  Weight Bearing Restrictions No  Pain Assessment  Pain Assessment Faces  Faces Pain Scale 8  Pain Location L hand and shoulder  Pain Descriptors / Indicators Aching;Discomfort;Grimacing;Guarding;Crying;Tightness  Pain Intervention(s) Limited activity within patient's tolerance;Monitored during session;Repositioned;Heat applied  Cognition  Arousal/Alertness Awake/alert  Behavior During Therapy Flat affect  Overall Cognitive Status Impaired/Different from baseline  Area of Impairment Following commands;Attention;Safety/judgement;Problem solving;Awareness;Memory  Current Attention Level Sustained  Memory Decreased short-term memory  Following Commands Follows one step commands with increased time  Safety/Judgement Decreased awareness of safety  Awareness Emergent  Problem Solving Slow processing;Requires verbal cues;Requires tactile cues;Difficulty sequencing;Decreased initiation  General Comments Pt following commands with increased time. Pt tearful this session with pain in LUE, but agreeable to therapy session. Pt requiring verbal and tactile cues for posture sitting EOB.  Bed Mobility  Overal bed mobility Needs Assistance  Rolling Mod assist;Max assist (Mod-Max A for rolling L and R to position bed pads)  Supine to sit Total assist;+2 for physical assistance  Sit to supine Total assist;+2 for physical assistance  General bed mobility comments Pt motivated to participate in therapy. Pt requiring total A but making effort to assit self to EOB.  Transfers  Overall transfer level Needs assistance  Equipment used 2 person hand held assist  Transfer via [place] to/from Stand  Sit to Stand Total assist;+2 physical assistance;+2  safety/equipment  General  transfer comment total A +2 for sit to stand. Pt holding weight in standing but unable to balance with heavy L lateral lean, requiring max cues to maintain midline.  Modified Rankin (Stroke Patients Only)  Pre-Morbid Rankin Score 0  Modified Rankin 5  Balance  Overall balance assessment Needs assistance  Sitting-balance support Feet supported;Single extremity supported;No upper extremity supported  Sitting balance-Leahy Scale Zero  Sitting balance - Comments Pt with L lateral lean, requiring max-total A to maintain sitting balance.  Postural control Posterior lean;Left lateral lean  Standing balance support Bilateral upper extremity supported  Standing balance-Leahy Scale Zero  Standing balance comment totalA+2 to maintain standing balance.  General Comments  General comments (skin integrity, edema, etc.) Pt emotional today, becoming tearful on multiple occassions, when reporting L UE pain, very appreciative of therapy assist, frustrated with inability to move like she wants.  Exercises  Exercises Other exercises  General Exercises - Upper Extremity  Shoulder ABduction PROM;Left;10 reps;Supine  Elbow Flexion PROM;10 reps;Left;Supine  Shoulder Exercises  Shoulder External Rotation PROM;Left;10 reps;Supine  Other Exercises  Other Exercises seated scapular retraction of R and L scapula. Facilitating movement of L scapula manually.  Other Exercises Manual therapy on L deltoid  PT - End of Session  Equipment Utilized During Treatment Gait belt  Activity Tolerance Patient tolerated treatment well  Patient left in bed;with bed alarm set;with call bell/phone within reach  Nurse Communication Mobility status   PT - Assessment/Plan  PT Plan Current plan remains appropriate  PT Visit Diagnosis Hemiplegia and hemiparesis;Other abnormalities of gait and mobility (R26.89);Unsteadiness on feet (R26.81);Muscle weakness (generalized) (M62.81);Difficulty in walking, not  elsewhere classified (R26.2);Other symptoms and signs involving the nervous system (R29.898);Apraxia (R48.2)  Hemiplegia - Right/Left Left  Hemiplegia - dominant/non-dominant Non-dominant  Hemiplegia - caused by Cerebral infarction;Nontraumatic SAH;Nontraumatic intracerebral hemorrhage  PT Frequency (ACUTE ONLY) Min 5X/week  Recommendations for Other Services Rehab consult  Follow Up Recommendations SNF  PT equipment Wheelchair (measurements PT);Wheelchair cushion (measurements PT);Hospital bed;Other (comment)  AM-PAC PT "6 Clicks" Mobility Outcome Measure (Version 2)  Help needed turning from your back to your side while in a flat bed without using bedrails? 1  Help needed moving from lying on your back to sitting on the side of a flat bed without using bedrails? 1  Help needed moving to and from a bed to a chair (including a wheelchair)? 1  Help needed standing up from a chair using your arms (e.g., wheelchair or bedside chair)? 1  Help needed to walk in hospital room? 1  Help needed climbing 3-5 steps with a railing?  1  6 Click Score 6  Consider Recommendation of Discharge To: CIR/SNF/LTACH  PT Goal Progression  Progress towards PT goals Progressing toward goals  Acute Rehab PT Goals  PT Goal Formulation With patient  Time For Goal Achievement 07/16/21  Potential to Achieve Goals Fair  PT Time Calculation  PT Start Time (ACUTE ONLY) 1408  PT Stop Time (ACUTE ONLY) 1503  PT Time Calculation (min) (ACUTE ONLY) 55 min  PT General Charges  $$ ACUTE PT VISIT 1 Visit  PT Treatments  $Therapeutic Exercise 8-22 mins  $Therapeutic Activity 8-22 mins   Corissa Oguinn B. Migdalia Dk PT, DPT Acute Rehabilitation Services Pager [phone] Office [phone]

## 2021-07-02 NOTE — Progress Notes (Signed)
This chaplain is present for F/U spiritual care. The Pt. is eating lunch at the time of the visit. The chaplain learns the Pt. prefers dessert first.  The chaplain listens as the Pt. shares her story about the transition from [place] with her brother-[name].  The Pt. updates the chaplain PT is for pain in her arm.    The Pt. Is finishing her lunch as we make plans for a F/U spiritual care visit.

## 2021-07-03 DIAGNOSIS — I63019 Cerebral infarction due to thrombosis of unspecified vertebral artery: Secondary | ICD-10-CM

## 2021-07-03 DIAGNOSIS — I428 Other cardiomyopathies: Secondary | ICD-10-CM

## 2021-07-03 DIAGNOSIS — J9601 Acute respiratory failure with hypoxia: Secondary | ICD-10-CM

## 2021-07-03 DIAGNOSIS — R140 Abdominal distension (gaseous): Secondary | ICD-10-CM

## 2021-07-03 DIAGNOSIS — N939 Abnormal uterine and vaginal bleeding, unspecified: Secondary | ICD-10-CM

## 2021-07-03 DIAGNOSIS — I10 Essential (primary) hypertension: Secondary | ICD-10-CM | POA: Diagnosis present

## 2021-07-03 LAB — RESP PANEL BY RT-PCR (FLU A&B, COVID) ARPGX2
Influenza A by PCR: NEGATIVE
Influenza B by PCR: NEGATIVE
SARS Coronavirus 2 by RT PCR: NEGATIVE

## 2021-07-03 MED ORDER — CLONIDINE HCL 0.1 MG PO TABS: 0.1000 mg | ORAL_TABLET | Freq: Three times a day (TID) | ORAL | 11 refills | Status: AC

## 2021-07-03 MED ORDER — CARVEDILOL 25 MG PO TABS: 25.0000 mg | ORAL_TABLET | Freq: Two times a day (BID) | ORAL | Status: AC

## 2021-07-03 MED ORDER — SIMETHICONE 40 MG/0.6ML PO SUSP: 40.0000 mg | Freq: Four times a day (QID) | ORAL | 0 refills | Status: AC | PRN

## 2021-07-03 MED ORDER — LACTULOSE 10 GM/15ML PO SOLN: 10.0000 g | Freq: Two times a day (BID) | ORAL | 0 refills | Status: AC

## 2021-07-03 MED ORDER — ADULT MULTIVITAMIN W/MINERALS CH: 1.0000 | ORAL_TABLET | Freq: Every day | ORAL | Status: AC

## 2021-07-03 MED ORDER — ACETAMINOPHEN 325 MG PO TABS: 650.0000 mg | ORAL_TABLET | ORAL | Status: DC | PRN

## 2021-07-03 MED ORDER — ATORVASTATIN CALCIUM 40 MG PO TABS: 40.0000 mg | ORAL_TABLET | Freq: Every day | ORAL | Status: AC

## 2021-07-03 MED ORDER — ASPIRIN 81 MG PO CHEW: 81.0000 mg | CHEWABLE_TABLET | Freq: Every day | ORAL | Status: AC

## 2021-07-03 MED ORDER — AMLODIPINE BESYLATE 10 MG PO TABS: 10.0000 mg | ORAL_TABLET | Freq: Every day | ORAL | Status: DC

## 2021-07-03 MED ORDER — OXYCODONE HCL 7.5 MG PO TABS: 7.5000 mg | ORAL_TABLET | ORAL | 0 refills | Status: DC | PRN

## 2021-07-03 MED ORDER — PANTOPRAZOLE SODIUM 40 MG PO TBEC
40.0000 mg | DELAYED_RELEASE_TABLET | Freq: Every day | ORAL | Status: DC
Start: 2021-07-04 — End: 2021-08-17

## 2021-07-03 MED ORDER — MEGESTROL ACETATE 40 MG PO TABS: 80.0000 mg | ORAL_TABLET | Freq: Three times a day (TID) | ORAL | Status: DC

## 2021-07-03 MED ORDER — ENSURE ENLIVE PO LIQD: 237.0000 mL | Freq: Three times a day (TID) | ORAL | 12 refills | Status: DC

## 2021-07-03 NOTE — Progress Notes (Signed)
  Speech Language Pathology Treatment: Dysphagia  Patient Details Name: Holly Bradley MRN: [id number] DOB: [birth year] Today's Date: 07/03/2021 Time: 2707-8675 SLP Time Calculation (min) (ACUTE ONLY): 24 min  Assessment / Plan / Recommendation Clinical Impression  Followed up for dysphagia intervention. Pt working on thin liquid and mechanical soft lunch meal as SLP arrived. Pt very pleasant, alert. SLP assessed pt with thin liquids via cup (small sip) and with use of rate controlled provale cup. Despite smaller size in bolus pt with consistent overt coughing following isolated trials of thin liquids and wet vocal quality concerning for reduced airway protection. Per most recent instrumental swallow assessment 6/30 pt did have trace instances of sensed aspiration when consuming larger quantities of thins and strong cough to manage aspirates. Clinically this date, pt coughing with thins despite use of smaller sips. Trialed nectar thick by cup. Pt without overt coughing and vocal quality remained clear. Discussed concerns with pt regarding coughing with thins and pt stated "I feel more comfortable when swallowing nectar thick liquids". Pt agreeable to diet modification to nectar thick liquids and mechanical soft textures with meds in puree. Pt with plans for DC to SNF this date recommend ST services at SNF for further management of dysphagia and cognitive linguistics.    HPI HPI: 45 yo female s/p mechanical thrombectomy on 5/24 of right MCA M1/M2/M3 and embolization of right MCA M3 branch due to contrast extravasation. Pt re-intubated 5/26. Repeat head CT on 5/26 showed increased cytotoxic edema and mildly increased leftward midline shift, now 5 mm, decreased SAH and IVH. Abdominal CT showing 22.9 cm mass, possibly consistent with uterine fibroids, no lymphadenopathy. s/p trach 6/3. MBS 6/21 and started on diet. Decannulated 6/26. Second MBS 6/30, diet advanced to dys3/thin. PMH fibroids HTN HF paranoid  psychosis recent hospitalizations january and february 2022.  Pt has since started speaking and consuming po.      SLP Plan  Continue with current plan of care       Recommendations  Diet recommendations: Dysphagia 3 (mechanical soft);Nectar-thick liquid Liquids provided via: Cup (rate controlled cup as needed) Medication Administration: Whole meds with puree Supervision: Patient able to self feed;Full supervision/cueing for compensatory strategies Compensations: Small sips/bites;Clear throat intermittently Postural Changes and/or Swallow Maneuvers: Seated upright 90 degrees;Upright 30-60 min after meal                Oral Care Recommendations: Oral care BID;Oral care prior to ice chip/H20 Follow up Recommendations: Skilled Nursing facility SLP Visit Diagnosis: Dysphagia, oropharyngeal phase (R13.12) Plan: Continue with current plan of care       [place], CCC-SLP Acute Rehabilitation Services   07/03/2021, 4:14 PM

## 2021-07-03 NOTE — Discharge Summary (Signed)
Physician Discharge Summary  Holly Bradley ZSW:[id number] DOB: 11-May-1976 DOA: 05/15/2021  PCP: Mitzi Hansen, MD  Admit date: 05/15/2021 Discharge date: 07/03/2021  Time spent: 35 minutes  Recommendations for Outpatient Follow-up:  Please assist this patient in arranging follow-up appointment within 1 month of discharge with Dr. Sethi/neurologist. Please assist this patient by arranging follow-up appointment with obstetrics and gynecology for 2 to 4 weeks after discharge Please assist the patient in calling and arranging follow-up appointment with Dr. Johney Frame within 2 weeks after discharge to skilled nursing facility Please assist this patient in arranging follow-up appointment with Dr. Haroldine Laws of cardiology.  Appointment recommended 2 weeks after discharge to SNF. Patient will discharge to [facility] She will need to continue aggressive PT and OT for rehabilitative therapies in regards to recent stroke with dense left hemiplegia She will also need to continue speech therapy for dysphagia as well as for speech and linguistics post stroke. Neurology recommends repeat hypercoagulable panel 12 weeks after admission.  Hospital admission date was 05/15/2021. After multiple discussions with the patient's brother this admission this patient is essentially a full code except for NO CPR   Discharge Diagnoses:  Principal Problem:   Cerebrovascular accident (CVA) ([place]) Active Problems:   Acute respiratory failure with hypoxia ([place])   Iron deficiency anemia due to chronic blood loss   Cerebral edema (HCC)   Chronic HFrEF (heart failure with reduced ejection fraction) ([place])   Pneumonia due to methicillin susceptible Staphylococcus aureus (HCC)   Staphylococcus epidermidis bacteremia   Abnormal uterine bleeding (AUB)   Hemorrhagic stroke (HCC)   Constipation   Uterine fibroid   Left hemiparesis (HCC)   Proctocolitis   Swelling of left hand   Hypertension   Non-ischemic  cardiomyopathy ([place])    Discharge Condition: Stable  Diet recommendation: Dysphagia 3 with thin liquids  Filed Weights   06/30/21 0427 07/01/21 0500 07/02/21 0500  Weight: 68.5 kg 70.5 kg 71.9 kg    History of present illness:  45 y.o. female past medical history of essential hypertension, diabetes mellitus type 2 large uterine fibroids paroxysmal SVT, chronic diastolic heart failure paranoid psychosis comes into the ED on 05/16/2019 with confusion was found to have a right MCA occlusion underwent thrombectomy which was complicated by contrast extravasation and M3 coiling embolization and revascularization.  She subsequently developed subarachnoid hemorrhage repeat imaging revealed right basal ganglia infarct with a 3 mm left shift, she was treated with hypertonic saline in the ICU, on 05/17/2021 she developed respiratory distress and he was intubated CT of the head showed increased cytotoxic edema and left side shift.  No mental status improvement is now trach and with nasal collar track for tube feedings.  She has been treated with Ancef and completed a 7-day course for MSSA pneumonia, her blood cultures on 05/21/2021 were positive for staph epi.  Infectious disease was consulted and are on board she has remained off antibiotics since 2022 repeated blood cultures on 06/02/2021 have remained negative.   5/24 Admit to neurology after ED visit with AMS, found to have R MCA stroke, underwent mechanical thrombectomy, M3 coil embolization, subsequent SAH 5/26 decreased mental status and respiratory distress, intubated 6/1 still not candidate for extubation with poor mentation and cough mechanics. Family unsure if pt would want trach. Staph epi bacteremia, continues on ancef 6/2 moving RUE RLE following some commands. decision to move forward with tracheostomy.   6/3 perc trach. Slight WBC increase but no fever, continues on ancef 6/4 Tolerating PSV/CPAP 6/5 WBC  up to 17, last day of ancef for staph  pna. Trying ATC  6/9 ID consulted for staph epi bacteremia . Felt contamination and stopped abx. 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 Continue aspirin and Lipitor. Continue LUE splint as recommended by therapies Patient will need to follow-up with neurology as well as interventional cardiology after discharge   Cytotoxic cerebral edema/hypernatremia Initially treated with 3% saline Sodium has normalized   Pain and swelling left hand Significantly improved with use of scheduled prednisone for total of 5 days and ice packs Continue Oxy IR prn   Left lower quadrant abdominal pain/suspected diverticulitis and proctocolitis Resolved 6/20 CT questioned possibility of diverticulitis but clinical picture not consistent Completed 7 days of Proctofoam   Chronic constipation Cont Chronulac 10 g BID   Hypertension/grade 2 diastolic dysfunction/NICM with history of reduced LV function EF 20 to 25% in 2021 Echocardiogram this admission reveals preserved LVEF with grade 2 diastolic dysfunction Continue carvedilol, hydralazine, Isordil, Entresto, Catapres and Norvasc Follow-up with cardiology after discharge   Dysphagia due to recent stroke Stable on D3 diet with thin liquids.   Acute respiratory failure with hypoxia status post tracheostomy/MSSA pneumonia Resolved Decannulated on 6/26 Completed 7 days of cefazolin   Iron deficiency anemia due to uterine blood loss/significant uterine fibroids: Continue  oral iron.   Hgb remained stable during recent menses 7/11: Hemoglobin 9.7 Patient will need to follow-up with gynecology after discharge to SNF  Protein calorie malnutrition Body mass index is 26.38 kg/m.  Continue dysphagia 1 diet with nectar thick liquids Continue nocturnal tube feedings with Jevity and Prosource per tube  History of paroxysmal SVT: Continue home amiodarone dose.  Fever of unknown source/resolved: Blood cultures on 5/30 with staph epidermis to have hominis-both likely contaminants Repeat blood cultures on 6/6 with staph epidermis Repeat blood cultures on 6/11 negative  Thrombocytosis: Resolved Likely acute phase reactant with peak platelets 531,000 on 6/16 and as of 6/24 platelets have normalized to 207,000  Procedures: Echocardiogram IR transcatheter embolization on 5/24 EEG IR placement of tracheostomy on 6/3  Consultations: PCCM Palliative medicine OB-GYN CIR General surgery Infectious disease  Antibiotics: Cefazolin 5/30 through 6/5   Discharge Exam: Vitals:   07/03/21 0410 07/03/21 0740  BP: (!) 142/77 139/70  Pulse: 84 79  Resp: 18 18  Temp: 98.8 F (37.1 C) 98.6 F (37 C)  SpO2: 100% 94%   Constitutional: Alert and sitting up in bed drinking Ensure beverage Respiratory: Anterior lung sounds remain clear, stable on room air, no increased work of breathing Cardiovascular: S1-S2, no peripheral edema, regular nontachycardic pulse, Abdomen:  LBM 7/10, soft and nondistended with normoactive bowel sounds.  Adequate oral intake documented. Musculoskeletal: Edema and pain in left hand has resolved Neurologic: CN 2-12 grossly intact except for left facial droop when smiling. Dense L hemiplegia with intact sensation-continues with soft somewhat muffled phonation Psychiatric: Alert and oriented x3 with pleasant affect    Discharge Instructions   Discharge Instructions     Ambulatory referral to Cardiology   Complete by: As directed     Recommendation  to follow-up in 2 weeks after discharge.  She will be discharging to [facility] in [place] referral to Cardiology   Complete by: As directed    Hospital follow-up recommended 2 weeks after discharge.  She will discharge to [facility] in [place]   Ambulatory referral to Neurology   Complete by: As directed    Follow up with Dr. Leonie Man at [facility] in 4-6 weeks. Too complicated for RN to follow. Thanks.   Ambulatory referral to Neurology   Complete by: As directed    An appointment is requested in approximately: 4 weeks.  Hospital follow-up after stroke.  Patient will be discharging to [facility] in [place] - low sodium heart healthy   Complete by: As directed    Dysphagia 3 with thin liquids   Increase activity slowly   Complete by: As directed    No wound care   Complete by: As directed       Allergies as of 07/03/2021   No Known Allergies      Medication List     STOP taking these medications    Klor-Con M20 20 MEQ tablet Generic drug: potassium chloride SA   metoprolol 200 MG 24 hr tablet Commonly known as: TOPROL-XL   torsemide 20 MG tablet Commonly known as: DEMADEX       TAKE these medications    acetaminophen 325 MG tablet Commonly known as: TYLENOL Place 2 tablets (650 mg total) into feeding tube every 4 (four) hours as needed for mild pain (or temp > 37.5 C (99.5 F)).   amiodarone 200 MG tablet Commonly known as: PACERONE Take 200 mg by mouth daily.   amLODipine 10 MG tablet Commonly known as: NORVASC Take 1 tablet (10 mg total) by mouth daily. Start taking on: July 04, 2021   aspirin 81 MG chewable tablet Chew 1 tablet (81 mg total) by mouth daily. Start taking on: July 04, 2021   atorvastatin 40 MG tablet Commonly known as: LIPITOR Take 1 tablet (40 mg total) by mouth daily. Start taking on: July 04, 2021   carvedilol 25 MG tablet Commonly known as: COREG Take 1 tablet (25 mg total) by mouth  2 (two) times daily with a meal.   cloNIDine 0.1 MG tablet Commonly known as: CATAPRES Take 1 tablet (0.1 mg total) by mouth 3 (three) times daily.   Entresto 97-103 MG Generic drug: sacubitril-valsartan Take 1 tablet by mouth 2 (two) times daily.   feeding supplement Liqd Take 237 mLs by mouth 3 (three) times daily between meals.   FeroSul 325 (65 FE) MG tablet Generic drug: ferrous sulfate Take 325 mg by mouth 2 (two) times daily.   hydrALAZINE 100 MG tablet Commonly known as: APRESOLINE Take 100 mg by mouth 3 (three) times daily.   isosorbide mononitrate 60 MG 24 hr tablet Commonly known as: IMDUR Take 60 mg by mouth daily.   lactulose 10 GM/15ML solution Commonly known as: CHRONULAC Take 15 mLs (10 g total) by mouth 2 (two) times daily.   megestrol 40 MG tablet Commonly known as: MEGACE Take 2 tablets (80 mg total) by mouth 3 (three) times daily. What changed:  medication strength how much to take when to take this additional instructions   multivitamin with minerals Tabs tablet Take 1 tablet by mouth daily. Start taking on: July 04, 2021   oxyCODONE HCl 7.5 MG Tabs Take 7.5 mg by mouth every 4 (four) hours as needed  for severe pain.   pantoprazole 40 MG tablet Commonly known as: PROTONIX Take 1 tablet (40 mg total) by mouth daily. Start taking on: July 04, 2021   simethicone 40 MG/0.6ML drops Commonly known as: MYLICON Take 0.6 mLs (40 mg total) by mouth every 6 (six) hours as needed for flatulence.       No Known Allergies  Follow-up Information     Garvin Fila, MD. Schedule an appointment as soon as possible for a visit in 1 month(s).   Specialties: Neurology, Radiology Why: stroke clinic Contact information: [address] [place] [address] [place] [facility] Follow up.   Specialty: Obstetrics and Gynecology Contact information: [place] [address] [phone]        Mitzi Hansen, MD. Schedule an appointment as soon as possible for a visit in 2 week(s).   Specialty: Internal Medicine Why: Once she is stable enough to discharge from skilled nursing facility she will need to follow-up with her primary care physician 2 weeks after discharge Contact information: [address] [place] [address] [phone]         Freada Bergeron, MD. Schedule an appointment as soon as possible for a visit in 2 week(s).   Specialties: Cardiology, Radiology Why: Please assist this patient by calling the above number to arrange for outpatient follow Contact information: [address] [place] [address] [phone]         Bensimhon, Shaune Pascal, MD. Schedule an appointment as soon as possible for a visit in 2 week(s).   Specialty: Cardiology Why: Is assist this patient by calling and arranging follow-up appointment with the cardiologist Contact information: [address] [place] [address] [phone]                  The results of significant diagnostics from this hospitalization (including imaging, microbiology, ancillary and laboratory) are listed below for reference.    Significant Diagnostic Studies: CT ABDOMEN PELVIS WO CONTRAST  Result Date: 06/19/2021 CLINICAL DATA:  Severe left lower quadrant pain with loose stools, known massive fibroids with active menses. EXAM: CT ABDOMEN AND PELVIS WITHOUT CONTRAST TECHNIQUE: Multidetector CT imaging of the abdomen and pelvis was performed following the standard protocol without IV contrast. COMPARISON:  CT 5262, CT chest 06/03/2021 FINDINGS: Lower chest: Atelectatic changes and/or scarring in left lung base. Bases otherwise clear. Cardiomegaly and small pericardial effusion. Heterogeneously dense breast tissue. Mild body wall edema of the chest most pronounced left  greater than right. Hepatobiliary: No visible focal liver lesion. Redemonstrated hepatomegaly. Normal liver attenuation. Smooth surface contour. Gallbladder not well visualized, largely decompressed at the time of exam. Pancreas: No pancreatic ductal dilatation or surrounding inflammatory changes. Spleen: Normal in size. No concerning splenic lesions. Adrenals/Urinary Tract: No discernible focal adrenal nodules or masses. Bilateral cortical scarring. Nonobstructing calculi present in both kidneys as well as additional vascular calcifications. No discernible obstructive urolithiasis or hydronephrosis with multiple calcifications in the deep pelvis appearing stable from prior likely reflecting phleboliths. Mild bladder wall thickening and perivesicular hazy stranding is noted. Stomach/Bowel: Challenging assessment of the bowel due to significant displacement and compression of numerous bowel loops due to the large uterine masses as detailed below. Feeding tube tip terminates at the level of the pylorus/duodenal bulb. Distal esophagus and stomach are otherwise unremarkable. Duodenum with a  normal sweep across the midline abdomen. High attenuation enteric contrast media has traversed to the level of the colon. No discernible small bowel thickening. Questionable thickening and edematous changes centered upon the rectosigmoid. A focal culprit diverticulum is difficult to identify. Appendix is poorly demonstrated. No convincing evidence of bowel obstruction. Vascular/Lymphatic: Suboptimal assessment lymph nodes given absence of contrast media and the large displaced mass effect of the uterine masses. No clearly enlarged abdominopelvic adenopathy. Atherosclerotic calcifications throughout the abdominal aorta and branch vessels. Reproductive: Massive enlargement of uterus which could reflect a conglomerate of multiple leiomyomas given the heterogeneous on the lobular appearance versus is a an irregularly degenerating  solitary fibroid, conglomerate size measures approximately 21.6 x 15.2 by 22.7 cm in size, not significantly changed accounting for differences in measurement. Poor visualization of the ovaries. Poor visualization of the cervix. Other: Low-attenuation free fluid is distributed throughout the abdomen and pelvis. Diffuse body wall edema. No free air. No bowel containing hernia. Musculoskeletal: No acute osseous abnormality or suspicious osseous lesion. Multilevel degenerative changes are present in the imaged portions of the spine. IMPRESSION: 1. Large abdominopelvic mass, presumably arising from the uterus, could reflect leiomyoma or leiomyosarcoma, incompletely characterized on this exam. Overall size and appearance not significantly changed from 05/17/2021 exam. This results in marked displacing mass effect of the abdominal viscera and complicates the evaluation of the bowel and mesentery. Further limitations by the absence of contrast media. 2. There is some likely circumferential thickening about the rectosigmoid though a focal culprit diverticulum is not well visualized. Could reflect a proctocolitis versus diverticulitis given visualization of few scattered diverticula in the better visualized portions of bowel. 3. Small to moderate volume simple attenuation free fluid throughout the abdomen and pelvis. 4. Transesophageal tube tip terminates at the gastric antrum/duodenal bulb. 5. Nonobstructing nephrolithiasis and bilateral renal vascular calcification. 6. Cardiomegaly.  Pericardial effusion. 7.  Aortic Atherosclerosis (ICD10-I70.0). Electronically Signed   By: Lovena Le M.D.   On: 06/19/2021 20:04   CT Angio Chest Pulmonary Embolism (PE) W or WO Contrast  Result Date: 06/03/2021 CLINICAL DATA:  Shortness of breath EXAM: CT ANGIOGRAPHY CHEST WITH CONTRAST TECHNIQUE: Multidetector CT imaging of the chest was performed using the standard protocol during bolus administration of intravenous contrast.  Multiplanar CT image reconstructions and MIPs were obtained to evaluate the vascular anatomy. CONTRAST:  35mL OMNIPAQUE IOHEXOL 350 MG/ML SOLN COMPARISON:  January 11, 2021. FINDINGS: Cardiovascular: Satisfactory opacification of the pulmonary arteries to the segmental level. No evidence of pulmonary embolism. Cardiomegaly. Small pericardial effusion. Mediastinum/Nodes: Scattered thyroid nodules, the largest of which measures 12 mm. No mediastinal hilar adenopathy. No axillary adenopathy. Lungs/Pleura: Tracheostomy. LEFT basilar atelectasis. No pleural effusion or pneumothorax. Upper Abdomen: Enteric tube tip terminates in the proximal duodenum. Musculoskeletal: No chest wall abnormality. No acute or significant osseous findings. Review of the MIP images confirms the above findings. IMPRESSION: 1. No evidence of pulmonary embolism. 2. Cardiomegaly. 3. Scattered thyroid nodules. Not clinically significant; no follow-up imaging recommended (ref: J Am Coll Radiol. 2015 Feb;12(2): 143-50). Electronically Signed   By: Valentino Saxon MD   On: 06/03/2021 17:39   DG Abd Portable 1V  Result Date: 06/18/2021 CLINICAL DATA:  Abdominal distension EXAM: PORTABLE ABDOMEN - 1 VIEW COMPARISON:  05/30/2021, CT from 05/17/2021 FINDINGS: Feeding catheter is noted in the distal aspect of the stomach. Scattered large and small bowel gas is noted. No obstructive changes are noted. Large soft tissue density is noted in the pelvis similar to that seen on  prior CT examination consistent with uterine fibroid change. IMPRESSION: Stable soft tissue mass in the pelvis similar to that seen on prior CT examination. No acute abnormality noted. Electronically Signed   By: Inez Catalina M.D.   On: 06/18/2021 10:16   DG Hand Complete Left  Result Date: 06/28/2021 CLINICAL DATA:  Left hand pain and swelling.  History of stroke. EXAM: LEFT HAND - COMPLETE 3+ VIEW COMPARISON:  None. FINDINGS: Nonspecific soft tissue swelling. No evidence of  fracture, dislocation, degenerative change or other focal finding. IMPRESSION: Nonspecific soft tissue swelling of unclear etiology. No bone or joint finding. Electronically Signed   By: Nelson Chimes M.D.   On: 06/28/2021 13:08   DG Swallowing Func-Speech Pathology  Result Date: 06/21/2021 Formatting of this result is different from the original. Objective Swallowing Evaluation: Type of Study: MBS-Modified Barium Swallow Study  Patient Details Name: Holly Bradley MRN: [id number] Date of Birth: [birth year] Today's Date: 06/21/2021 Time: SLP Start Time (ACUTE ONLY): 1020 -SLP Stop Time (ACUTE ONLY): 1045 SLP Time Calculation (min) (ACUTE ONLY): 25 min Past Medical History: Past Medical History: Diagnosis Date  Hypertension  Past Surgical History: Past Surgical History: Procedure Laterality Date  IR ANGIOGRAM FOLLOW UP STUDY  05/15/2021  IR CT HEAD LTD  05/15/2021  IR PERCUTANEOUS ART THROMBECTOMY/INFUSION INTRACRANIAL INC DIAG ANGIO  05/15/2021     IR PERCUTANEOUS ART THROMBECTOMY/INFUSION INTRACRANIAL INC DIAG ANGIO  05/15/2021  IR TRANSCATH/EMBOLIZ  05/15/2021  RADIOLOGY WITH ANESTHESIA N/A 05/15/2021  Procedure: IR WITH ANESTHESIA;  Surgeon: Radiologist, Medication, MD;  Location: [facility];  Service: Radiology;  Laterality: N/A; HPI: 45 yo female s/p mechanical thrombectomy on 5/24 of right MCA M1/M2/M3 and embolization of right MCA M3 branch due to contrast extravasation. Pt re-intubated 5/26. Repeat head CT on 5/26 showed increased cytotoxic edema and mildly increased leftward midline shift, now 5 mm, decreased SAH and IVH. Abdominal CT showing 22.9 cm mass, possibly consistent with uterine fibroids, no lymphadenopathy. s/p trach 6/3. MBS 6/21 and started on diet. Decannulated 6/26. PMH fibroids HTN HF paranoid psychosis recent hospitalizations january and february 2022.  Subjective: Alert and participatory Assessment / Plan / Recommendation CHL IP CLINICAL IMPRESSIONS 06/21/2021 Clinical Impression Pt presents with  improving swallow function with mild dysphagia marked by focal CN deficits V and VII on left that led to decreased bolus cohesion and some left lateral sulcus pocketing/residue.  There was excellent pharyngeal strength and NO residue post-swallow, of any consistency. There were occasional instances of very trace aspiration of thin liquids - this occurred when thin liquids were consumed in large, successive boluses and a slight amount entered the larynx PRIOR to closure of the vestibule.  There was a cough response. Laryngeal vestibule closure was complete.  Aspiration, when it occurred, was related to timing.  Pt demonstrated improved function when she took small, controlled sips of thin liquid.  Recommend advancing diet to dysphagia 3, thin liquids (small sips); give meds whole in puree. Pt was very pleased with outcome! SLP Visit Diagnosis Dysphagia, oropharyngeal phase (R13.12) Attention and concentration deficit following -- Frontal lobe and executive function deficit following -- Impact on safety and function Mild aspiration risk   CHL IP TREATMENT RECOMMENDATION 06/21/2021 Treatment Recommendations Therapy as outlined in treatment plan below   Prognosis 06/21/2021 Prognosis for Safe Diet Advancement Good Barriers to Reach Goals -- Barriers/Prognosis Comment -- CHL IP DIET RECOMMENDATION 06/21/2021 SLP Diet Recommendations Dysphagia 3 (Mech soft) solids;Thin liquid Liquid Administration via Cup;Straw Medication Administration Whole meds with puree  Compensations Slow rate;Small sips/bites;Monitor for anterior loss Postural Changes --   CHL IP OTHER RECOMMENDATIONS 06/21/2021 Recommended Consults -- Oral Care Recommendations Oral care BID Other Recommendations --   CHL IP FOLLOW UP RECOMMENDATIONS 06/21/2021 Follow up Recommendations Inpatient Rehab   CHL IP FREQUENCY AND DURATION 06/21/2021 Speech Therapy Frequency (ACUTE ONLY) min 3x week Treatment Duration 2 weeks      CHL IP ORAL PHASE 06/21/2021 Oral Phase Impaired  Oral - Pudding Teaspoon -- Oral - Pudding Cup -- Oral - Honey Teaspoon -- Oral - Honey Cup -- Oral - Nectar Teaspoon -- Oral - Nectar Cup Decreased bolus cohesion;Premature spillage Oral - Nectar Straw Decreased bolus cohesion;Premature spillage Oral - Thin Teaspoon -- Oral - Thin Cup Decreased bolus cohesion;Premature spillage Oral - Thin Straw Decreased bolus cohesion;Premature spillage Oral - Puree Left pocketing in lateral sulci Oral - Mech Soft -- Oral - Regular Left pocketing in lateral sulci Oral - Multi-Consistency -- Oral - Pill -- Oral Phase - Comment --  CHL IP PHARYNGEAL PHASE 06/21/2021 Pharyngeal Phase Impaired Pharyngeal- Pudding Teaspoon -- Pharyngeal -- Pharyngeal- Pudding Cup -- Pharyngeal -- Pharyngeal- Honey Teaspoon -- Pharyngeal -- Pharyngeal- Honey Cup -- Pharyngeal -- Pharyngeal- Nectar Teaspoon -- Pharyngeal -- Pharyngeal- Nectar Cup Delayed swallow initiation-vallecula Pharyngeal -- Pharyngeal- Nectar Straw Delayed swallow initiation-vallecula Pharyngeal -- Pharyngeal- Thin Teaspoon -- Pharyngeal -- Pharyngeal- Thin Cup Delayed swallow initiation-pyriform sinuses Pharyngeal -- Pharyngeal- Thin Straw Delayed swallow initiation-pyriform sinuses;Penetration/Aspiration before swallow Pharyngeal Material enters airway, passes BELOW cords and not ejected out despite cough attempt by patient Pharyngeal- Puree Delayed swallow initiation-vallecula Pharyngeal -- Pharyngeal- Mechanical Soft -- Pharyngeal -- Pharyngeal- Regular Delayed swallow initiation-pyriform sinuses;Delayed swallow initiation-vallecula Pharyngeal -- Pharyngeal- Multi-consistency -- Pharyngeal -- Pharyngeal- Pill -- Pharyngeal -- Pharyngeal Comment --  CHL IP CERVICAL ESOPHAGEAL PHASE 06/21/2021 Cervical Esophageal Phase WFL Pudding Teaspoon -- Pudding Cup -- Honey Teaspoon -- Honey Cup -- Nectar Teaspoon -- Nectar Cup -- Nectar Straw -- Thin Teaspoon -- Thin Cup -- Thin Straw -- Puree -- Mechanical Soft -- Regular --  Multi-consistency -- Pill -- Cervical Esophageal Comment -- [name] 06/21/2021, 12:04 PM              DG Swallowing Func-Speech Pathology  Result Date: 06/12/2021 Formatting of this result is different from the original. Objective Swallowing Evaluation: Type of Study: MBS-Modified Barium Swallow Study  Patient Details Name: Holly Bradley MRN: [id number] Date of Birth: 1976-09-15 Today's Date: 06/12/2021 Time: SLP Start Time (ACUTE ONLY): 1230 -SLP Stop Time (ACUTE ONLY): 1310 SLP Time Calculation (min) (ACUTE ONLY): 40 min Past Medical History: Past Medical History: Diagnosis Date  Hypertension  Past Surgical History: Past Surgical History: Procedure Laterality Date  IR ANGIOGRAM FOLLOW UP STUDY  05/15/2021  IR CT HEAD LTD  05/15/2021  IR PERCUTANEOUS ART THROMBECTOMY/INFUSION INTRACRANIAL INC DIAG ANGIO  05/15/2021     IR PERCUTANEOUS ART THROMBECTOMY/INFUSION INTRACRANIAL INC DIAG ANGIO  05/15/2021  IR TRANSCATH/EMBOLIZ  05/15/2021  RADIOLOGY WITH ANESTHESIA N/A 05/15/2021  Procedure: IR WITH ANESTHESIA;  Surgeon: Radiologist, Medication, MD;  Location: [place];  Service: Radiology;  Laterality: N/A; HPI: Pt is a [name] who presented with AMS and slurred speech after being found down. MRI showing moderately large acute right MCA infarct with associated petechial hemorrhage, SAH, and minimal IVH. S/p mechanical thrombectomy of M1/M2/M3 and embolization of right MCA M3 branch. Significant PMH: uncontrolled HTN, tobacco use disorder, HF, paroxysmal SVT, paranoid psychosis. Required intubation s/p trach 6/3, changed to cuffless 6/16.  Pt pending PEG however large uterine mass disrupting planned placement, and goals of care are unclear at this time  Subjective: Alert and participatory Assessment / Plan / Recommendation CHL IP CLINICAL IMPRESSIONS 06/12/2021 Clinical Impression Pt presented with a much more successful swallow than anticipated -  her dysphagia is relatively mild.  PMV was in place for the  study; she followed all commands; was unable to communicate verbally but used gestures/facial expressions. Pt demonstrated mild deficits with oral control, lip seal on left, and premature loss of thin liquids over base of tongue. Thin liquids reached the pyriform sinuses and occasionally a trace amount spilled into the larynx PRIOR TO swallow onset - there was no immediate cough response, but after 30 seconds a cough occurred. Pt protected her airway reliably with nectar thick liquids, purees, and a cracker - there was consistent laryngeal vestibule closure (no penetration/aspiration).  Also notable was strong pharyngeal squeeze with no residue remaining in pharynx after the swallows.  Recommend beginning an oral diet and allowing Ms. Sybert an opportunity to [place].  REC: dysphagia 2; nectar thick liquids; meds crushed in puree.  Pt will need to be sitting as upright as possible; fully supervise all PO intake and assist her with self-feeding as able.  Reduce TF per dietician.  Use PMV with PO intake.  SLP will follow for safety/diet progression. SLP Visit Diagnosis Dysphagia, oropharyngeal phase (R13.12) Attention and concentration deficit following -- Frontal lobe and executive function deficit following -- Impact on safety and function Mild aspiration risk   CHL IP TREATMENT RECOMMENDATION 06/12/2021 Treatment Recommendations Therapy as outlined in treatment plan below   Prognosis 06/12/2021 Prognosis for Safe Diet Advancement Good Barriers to Reach Goals -- Barriers/Prognosis Comment -- CHL IP DIET RECOMMENDATION 06/12/2021 SLP Diet Recommendations Dysphagia 2 (Fine chop) solids;Nectar thick liquid Liquid Administration via Cup;Straw Medication Administration Crushed with puree Compensations Minimize environmental distractions Postural Changes Seated upright at 90 degrees   CHL IP OTHER RECOMMENDATIONS 06/12/2021 Recommended Consults -- Oral Care Recommendations Oral care BID Other Recommendations Order  thickener from pharmacy   CHL IP FOLLOW UP RECOMMENDATIONS 06/08/2021 Follow up Recommendations Inpatient Rehab;Skilled Nursing facility   [facility] IP FREQUENCY AND DURATION 06/12/2021 Speech Therapy Frequency (ACUTE ONLY) min 3x week Treatment Duration 2 weeks      CHL IP ORAL PHASE 06/12/2021 Oral Phase Impaired Oral - Pudding Teaspoon -- Oral - Pudding Cup -- Oral - Honey Teaspoon -- Oral - Honey Cup -- Oral - Nectar Teaspoon -- Oral - Nectar Cup -- Oral - Nectar Straw -- Oral - Thin Teaspoon -- Oral - Thin Cup -- Oral - Thin Straw -- Oral - Puree -- Oral - Mech Soft -- Oral - Regular Piecemeal swallowing Oral - Multi-Consistency -- Oral - Pill -- Oral Phase - Comment --  CHL IP PHARYNGEAL PHASE 06/12/2021 Pharyngeal Phase Impaired Pharyngeal- Pudding Teaspoon -- Pharyngeal -- Pharyngeal- Pudding Cup -- Pharyngeal -- Pharyngeal- Honey Teaspoon -- Pharyngeal -- Pharyngeal- Honey Cup -- Pharyngeal -- Pharyngeal- Nectar Teaspoon -- Pharyngeal -- Pharyngeal- Nectar Cup Delayed swallow initiation-vallecula Pharyngeal -- Pharyngeal- Nectar Straw Delayed swallow initiation-vallecula Pharyngeal -- Pharyngeal- Thin Teaspoon -- Pharyngeal -- Pharyngeal- Thin Cup Delayed swallow initiation-pyriform sinuses Pharyngeal -- Pharyngeal- Thin Straw Delayed swallow initiation-pyriform sinuses;Penetration/Aspiration before swallow Pharyngeal Material enters airway, passes BELOW cords without attempt by patient to eject out (silent aspiration) Pharyngeal- Puree WFL Pharyngeal -- Pharyngeal- Mechanical Soft -- Pharyngeal -- Pharyngeal- Regular WFL Pharyngeal -- Pharyngeal- Multi-consistency -- Pharyngeal -- Pharyngeal- Pill -- Pharyngeal --  Pharyngeal Comment --  No flowsheet data found. [name] 06/12/2021, 2:23 PM               Microbiology: No results found for this or any previous visit (from the past 240 hour(s)).   Labs: Basic Metabolic Panel: Recent Labs  Lab 06/28/21 1006 06/29/21 0330 06/30/21 0033  07/01/21 0254  NA 139 141 139 138  K 3.5 4.3 4.2 4.2  CL 111 113* 112* 110  CO2 19* 21* 20* 21*  GLUCOSE 148* 99 162* 143*  BUN 15 23* 27* 30*  CREATININE 1.01* 1.17* 1.16* 1.09*  CALCIUM 10.1 10.2 10.1 10.0   Liver Function Tests: Recent Labs  Lab 06/28/21 1006  AST 25  ALT 23  ALKPHOS 137*  BILITOT 0.6  PROT 7.1  ALBUMIN 3.0*   No results for input(s): LIPASE, AMYLASE in the last 168 hours. No results for input(s): AMMONIA in the last 168 hours. CBC: Recent Labs  Lab 06/28/21 1006 07/02/21 0330  WBC 5.3 8.8  NEUTROABS 4.5  --   HGB 8.9* 9.7*  HCT 29.2* 31.9*  MCV 85.1 88.1  PLT 267 210   Cardiac Enzymes: No results for input(s): CKTOTAL, CKMB, CKMBINDEX, TROPONINI in the last 168 hours. BNP: BNP (last 3 results) No results for input(s): BNP in the last 8760 hours.  ProBNP (last 3 results) No results for input(s): PROBNP in the last 8760 hours.  CBG: Recent Labs  Lab 06/28/21 0336 06/28/21 0747 06/28/21 1245 06/28/21 1619 06/30/21 0759  GLUCAP 109* 97 119* 126* 146*       Signed:  Erin Hearing ANP [facility] 07/03/2021, 11:36 AM

## 2021-07-03 NOTE — Progress Notes (Signed)
Occupational Therapy Treatment Patient Details Name: Holly Bradley MRN: [id number] DOB: [birth year] Today's Date: 07/03/2021    History of present illness 45 yo female s/p mechanical thrombectomy on 5/24 of right MCA M1/M2/M3 and embolization of right MCA M3 branch due to contrast extravasation. Pt re-intubated 5/26. Repeat head CT on 5/26 showed increased cytotoxic edema and mildly increased leftward midline shift, now 5 mm, decreased SAH and IVH. Abdominal CT showing 22.9 cm mass, possibly consistent with uterine fibroids, no lymphadenopathy. s/p trach 6/3,  decannulated 6/26, and Trach removed 6/30. PMH fibriods HTN HF paranoid psychosis recent hospitalizations january and february 2022Trach removed 6/30.   OT comments  Focused session on PROM of LUE to reduce pain at L hand, elbow, and shoulder in preparation for bed mobility, exercises, and OOB activity later today. Pt tolerating PROM of hand, wrist, elbow, and shoulder with pain 4/10 (on face scale). Pt reporting more pain at digits with ROM. Pt reporting RUE feeling more comfortable at end of session. Setting pt up for breakfast at end of session and optimizing position for self feeding. Continue to recommend dc to SNF and will continue to follow acutely as admitted.   Follow Up Recommendations  SNF    Equipment Recommendations  Wheelchair (measurements OT);Wheelchair cushion (measurements OT);Hospital bed;3 in 1 bedside commode    Recommendations for Other Services Rehab consult    Precautions / Restrictions Precautions Precautions: Fall Precaution Comments: L hemiparesis, PRAFO,  resting hand splint, sublux to L shoulder Required Braces or Orthoses: Other Brace Other Brace: L resting hand splint (4 hours on/4 hours off)       Mobility Bed Mobility        Transfers         Balance               ADL either performed or assessed with clinical judgement   ADL Overall ADL's : Needs  assistance/impaired Eating/Feeding: Set up;Bed level Eating/Feeding Details (indicate cue type and reason): Assisting to set pt up for breakfast. Uponing containers and optimizing position to table. pt demonstrating ability to use fork with R hand and bring to mouth without difficulty                             General ADL Comments: Focused session on PROM of LUE to reduce pain in preparation for activity later today.     Vision       Perception     Praxis      Cognition Arousal/Alertness: Awake/alert Behavior During Therapy: Flat affect Overall Cognitive Status: Impaired/Different from baseline Area of Impairment: Following commands;Attention;Safety/judgement;Problem solving;Awareness;Memory                   Current Attention Level: Sustained Memory: Decreased short-term memory Following Commands: Follows one step commands with increased time Safety/Judgement: Decreased awareness of safety Awareness: Emergent Problem Solving: Slow processing;Requires verbal cues;Requires tactile cues;Difficulty sequencing;Decreased initiation General Comments: Able to verbalize pain tolerance and agreeable to ROM in preparation for session later. Seems in good spirits. Discussing the dc plan and reports she wants to go home but understands the need for therapy because she has stairs.        Exercises Exercises: General Upper Extremity General Exercises - Upper Extremity Shoulder Flexion: PROM;Left;10 reps;Supine Shoulder ABduction: PROM;Left;10 reps;Supine Shoulder ADduction: PROM;Left;10 reps;Supine Elbow Flexion: PROM;10 reps;Left;Supine Elbow Extension: PROM;Left;10 reps;Supine Wrist Flexion: PROM;Left;10 reps;Seated Wrist Extension: PROM;Left;Seated;10 reps Digit Composite Flexion: PROM;Left;10 reps;Seated  Composite Extension: PROM;Left;10 reps;Seated Shoulder Exercises Shoulder External Rotation: PROM;Left;10 reps;Supine   Shoulder Instructions        General Comments      Pertinent Vitals/ Pain       Pain Assessment: Faces Faces Pain Scale: Hurts little more Pain Location: L hand and shoulder Pain Descriptors / Indicators: Discomfort;Grimacing Pain Intervention(s): Monitored during session;Repositioned;Other (comment) (Facilitating PROM)  Home Living                                          Prior Functioning/Environment              Frequency  Min 5X/week        Progress Toward Goals  OT Goals(current goals can now be found in the care plan section)  Progress towards OT goals: Progressing toward goals  Acute Rehab OT Goals Patient Stated Goal: Patient wanting to move better and not have pain to her L hand OT Goal Formulation: With patient Time For Goal Achievement: 07/10/21 Potential to Achieve Goals: Fair ADL Goals Pt Will Perform Grooming: sitting;with mod assist Additional ADL Goal #1: pt will static sit with mod (A) as precursor to adls Additional ADL Goal #2: pt will tolerate L resting hand splint for up to 6 hours on Additional ADL Goal #3: Pt will perform basic transfer with mod A +2 as a precursor to ADLs  Plan Discharge plan remains appropriate    Co-evaluation                 AM-PAC OT "6 Clicks" Daily Activity     Outcome Measure   Help from another person eating meals?: A Little Help from another person taking care of personal grooming?: A Little Help from another person toileting, which includes using toliet, bedpan, or urinal?: A Lot Help from another person bathing (including washing, rinsing, drying)?: A Lot Help from another person to put on and taking off regular upper body clothing?: A Lot Help from another person to put on and taking off regular lower body clothing?: Total 6 Click Score: 13    End of Session    OT Visit Diagnosis: Unsteadiness on feet (R26.81);Muscle weakness (generalized) (M62.81);Hemiplegia and hemiparesis Hemiplegia - Right/Left:  Left Hemiplegia - dominant/non-dominant: Non-Dominant Hemiplegia - caused by: Cerebral infarction   Activity Tolerance Patient tolerated treatment well   Patient Left in bed;with call bell/phone within reach;with bed alarm set   Nurse Communication Mobility status        Time: 3790-2409 OT Time Calculation (min): 14 min  Charges: OT General Charges $OT Visit: 1 Visit OT Treatments $Therapeutic Exercise: 8-22 mins  [place], OTR/L Acute Rehab Pager: [phone] Office: [place] 07/03/2021, 10:10 AM

## 2021-07-03 NOTE — Progress Notes (Signed)
VS wnL, Pt comfortably resting, dinner served and ensure given. Awaiting on Ptar for transport. Report given to RN Doroteo Bradford at [facility]. All questions and concerns addressed.

## 2021-07-03 NOTE — TOC Progression Note (Signed)
Transition of Care [facility]) - Progression Note    Patient Details  Name: Holly Bradley MRN: [id number] Date of Birth: [birth year]  Transition of Care [facility]) CM/SW [place], RN Phone Number: 07/03/2021, 11:08 AM  Clinical Narrative:    Case management spoke with Madilyn Fireman, MSW and the patient will be able to discharge to [facility] today.  Erin Hearing, NP is aware and she is completing discharge summary at this time.  Rapid COVID placed and bedside nursing is aware.  Once the COVID results are back - the patient's transportation to the facility will be arranged - [facility] this afternoon via [place].  The patient's family is aware of pending discharge.  CM and MSW will continue to follow the patient for discharge to [facility].   Expected Discharge Plan: [place] Barriers to Discharge: Continued Medical Work up  Expected Discharge Plan and Services Expected Discharge Plan: [place] In-house Referral: Chaplain Discharge Planning Services: CM Consult   Living arrangements for the past 2 months: Single Family Home                                       Social Determinants of Health (SDOH) Interventions    Readmission Risk Interventions No flowsheet data found.

## 2021-07-03 NOTE — Progress Notes (Addendum)
1pm: Patient will go to [facility] via [place] has been scheduled for pickup for first available. The number to call for report is [phone].  RN aware of discharge plan.  10:30am: Patient has been offered a bed at [facility].  CSW requested MD order COVID test for patient.  9:30am: CSW spoke with patient's brother [name] to provide him with an update regarding discharge plan - he is agreeable. CSW informed [name] that the patient will be discharged to the facility once a bed becomes available.  CSW spoke with patient at bedside - she was sitting up right in bed eating breakfast. CSW informed patient of bed offer from [facility] - she is agreeable to rehab so that she can get stronger and return home with her brother [name]. Patient became tearful and stated how much she appreciated her brother. CSW offered patient words of encouragement and told her to request assistance if any needs arise prior to discharge - she stated agreement.  Madilyn Fireman, MSW, LCSW Transitions of Care  Clinical Social Worker II [phone]

## 2021-07-03 NOTE — Progress Notes (Signed)
Physical Therapy Treatment Patient Details Name: Holly Bradley MRN: [id number] DOB: 10-25-1976 Today's Date: 07/03/2021    History of Present Illness 45 yo female s/p mechanical thrombectomy on 5/24 of right MCA M1/M2/M3 and embolization of right MCA M3 branch due to contrast extravasation. Pt re-intubated 5/26. Repeat head CT on 5/26 showed increased cytotoxic edema and mildly increased leftward midline shift, now 5 mm, decreased SAH and IVH. Abdominal CT showing 22.9 cm mass, possibly consistent with uterine fibroids, no lymphadenopathy. s/p trach 6/3,  decannulated 6/26, and Trach removed 6/30. PMH fibriods HTN HF paranoid psychosis recent hospitalizations january and february 2022Trach removed 6/30.    PT Comments    Pt is experiencing increased discomfort in L UE on entry however is very motivated to work with therapy. Positioned patient in sidelying and assisted OT with postioning of L scapula for KT taping to improve resting location and to decreased pain in L shoulder. Pt requiring max A x2 for coming to [facility], once there worked with mirror on seated balance and achieving and maintaining mid line. Pt able to decrease pushing to the L and find neutral balance position with only light min A. Able to progress to working in [place] on achieving midline in standing as well. Pt happy with her progress today and very fatigued at end of session from her hard work. Requires total A to return to bed and is asleep before R side could be positioned with pillows. D/c plan remains appropriate. PT will continue to follow daily.    Follow Up Recommendations  SNF     Equipment Recommendations  Wheelchair (measurements PT);Wheelchair cushion (measurements PT);Hospital bed;Other (comment)    Recommendations for Other Services       Precautions / Restrictions Precautions Precautions: Fall Precaution Comments: L hemiparesis, PRAFO,  resting hand splint, sublux to L shoulder Required Braces or Orthoses:  Other Brace Other Brace: L resting hand splint (4 hours on/4 hours off) Restrictions Weight Bearing Restrictions: No    Mobility  Bed Mobility Overal bed mobility: Needs Assistance Bed Mobility: Supine to Sit;Sit to Sidelying;Rolling Rolling: Max assist;+2 for physical assistance   Supine to sit: Max assist;+2 for physical assistance   Sit to sidelying: Total assist;+2 for physical assistance General bed mobility comments: Max A +2 to bring BLES over EOB and then elevate trunk. Total A +2 for returning to supine    Transfers Overall transfer level: Needs assistance Equipment used: Ambulation equipment used Transfers: Sit to/from Stand Sit to Stand: Total assist;+2 physical assistance         General transfer comment: Total A for power up into standing and correcting balance.     Modified Rankin (Stroke Patients Only) Modified Rankin (Stroke Patients Only) Pre-Morbid Rankin Score: No symptoms Modified Rankin: Severe disability     Balance Overall balance assessment: Needs assistance Sitting-balance support: Feet supported;Single extremity supported;No upper extremity supported Sitting balance-Leahy Scale: Poor Sitting balance - Comments: Sitting at EOB with Min A for L lateral lean. Use of verbal, visual, and tactile cues for sitting posture   Standing balance support: Single extremity supported;During functional activity Standing balance-Leahy Scale: Zero Standing balance comment: total Ax2 for maintaining                            Cognition Arousal/Alertness: Awake/alert Behavior During Therapy: Flat affect Overall Cognitive Status: Impaired/Different from baseline Area of Impairment: Following commands;Attention;Safety/judgement;Problem solving;Awareness;Memory  Current Attention Level: Sustained Memory: Decreased short-term memory Following Commands: Follows one step commands with increased time Safety/Judgement:  Decreased awareness of safety Awareness: Emergent Problem Solving: Slow processing;Requires verbal cues;Requires tactile cues;Difficulty sequencing;Decreased initiation General Comments: Eager to participate in therapy. Following simple commands. Fatigues quickly      Exercises Other Exercises Other Exercises: R lateral lean and recover to midline with aid of mirror, x 5 requiring multimodal cuing Other Exercises: K tape applied to shoulder to decreased pain and promote optimized positioning at shoulder. Other Exercises: R lateral leaning and posture correcting to midline; use of mirror; Min-Mod A for posture correction; visual, verbal, and tactile cues provided; x5    General Comments General comments (skin integrity, edema, etc.): Provided asssitance in positioning for L UE for KT application by OT to decrease pain and discomfort      Pertinent Vitals/Pain Pain Assessment: Faces Faces Pain Scale: Hurts even more Pain Location: L hand and shoulder Pain Descriptors / Indicators: Discomfort;Grimacing Pain Intervention(s): Limited activity within patient's tolerance;Monitored during session;Repositioned     PT Goals (current goals can now be found in the care plan section) Acute Rehab PT Goals Patient Stated Goal: Patient wanting to move better and not have pain to her L hand PT Goal Formulation: With patient Time For Goal Achievement: 07/16/21 Potential to Achieve Goals: Fair Progress towards PT goals: Progressing toward goals    Frequency    Min 5X/week      PT Plan Current plan remains appropriate    Co-evaluation PT/OT/SLP Co-Evaluation/Treatment: Yes Reason for Co-Treatment: For patient/therapist safety PT goals addressed during session: Mobility/safety with mobility OT goals addressed during session: ADL's and self-care      AM-PAC PT "6 Clicks" Mobility   Outcome Measure  Help needed turning from your back to your side while in a flat bed without using  bedrails?: Total Help needed moving from lying on your back to sitting on the side of a flat bed without using bedrails?: Total Help needed moving to and from a bed to a chair (including a wheelchair)?: Total Help needed standing up from a chair using your arms (e.g., wheelchair or bedside chair)?: Total Help needed to walk in hospital room?: Total Help needed climbing 3-5 steps with a railing? : Total 6 Click Score: 6    End of Session   Activity Tolerance: Patient tolerated treatment well Patient left: in bed;with bed alarm set;with call bell/phone within reach (sound asleep) Nurse Communication: Mobility status PT Visit Diagnosis: Hemiplegia and hemiparesis;Other abnormalities of gait and mobility (R26.89);Unsteadiness on feet (R26.81);Muscle weakness (generalized) (M62.81);Difficulty in walking, not elsewhere classified (R26.2);Other symptoms and signs involving the nervous system (R29.898);Apraxia (R48.2) Hemiplegia - Right/Left: Left Hemiplegia - dominant/non-dominant: Non-dominant Hemiplegia - caused by: Cerebral infarction;Nontraumatic SAH;Nontraumatic intracerebral hemorrhage     Time: 1115-1205 PT Time Calculation (min) (ACUTE ONLY): 50 min  Charges:  $Gait Training: 8-22 mins $Therapeutic Exercise: 8-22 mins                     Holly Boger B. Migdalia Dk PT, DPT Acute Rehabilitation Services Pager [phone] Office [phone]    [place] 07/03/2021, 2:27 PM

## 2021-07-03 NOTE — Progress Notes (Signed)
Occupational Therapy Treatment Patient Details Name: Holly Bradley MRN: [id number] DOB: 03-04-1976 Today's Date: 07/03/2021    History of present illness 45 yo female s/p mechanical thrombectomy on 5/24 of right MCA M1/M2/M3 and embolization of right MCA M3 branch due to contrast extravasation. Pt re-intubated 5/26. Repeat head CT on 5/26 showed increased cytotoxic edema and mildly increased leftward midline shift, now 5 mm, decreased SAH and IVH. Abdominal CT showing 22.9 cm mass, possibly consistent with uterine fibroids, no lymphadenopathy. s/p trach 6/3,  decannulated 6/26, and Trach removed 6/30. PMH fibriods HTN HF paranoid psychosis recent hospitalizations january and february 2022Trach removed 6/30.   OT comments  Pt progressing towards established OT goals and continues to present with high motivation. Pt performing sitting balance at EOB; achieving sitting balance with Min A for L lateral lean. Use of mirror to facilitate pt's correction to midline. Pt performing sit<>stand with stedy requiring Total A +2. Once sitting in stedy seat, challenging sitting balance further to achieve midline again. Application of K-tape for scapular retraction and shoulder stabilization. Continue to recommend dc to SNF and will continue to follow acutely as admitted.   Follow Up Recommendations  SNF    Equipment Recommendations  Wheelchair (measurements OT);Wheelchair cushion (measurements OT);Hospital bed;3 in 1 bedside commode    Recommendations for Other Services Rehab consult    Precautions / Restrictions Precautions Precautions: Fall Precaution Comments: L hemiparesis, PRAFO,  resting hand splint, sublux to L shoulder Required Braces or Orthoses: Other Brace Other Brace: L resting hand splint (4 hours on/4 hours off)       Mobility Bed Mobility Overal bed mobility: Needs Assistance Bed Mobility: Supine to Sit;Sit to Sidelying;Rolling Rolling: Max assist;+2 for physical assistance    Supine to sit: Max assist;+2 for physical assistance   Sit to sidelying: Total assist;+2 for physical assistance General bed mobility comments: Max A +2 to bring BLES over EOB and then elevate trunk. Total A +2 for returning to supine    Transfers Overall transfer level: Needs assistance Equipment used: Ambulation equipment used Transfers: Sit to/from Stand Sit to Stand: Total assist;+2 physical assistance         General transfer comment: Total A for power up into standing and correcting balance.    Balance Overall balance assessment: Needs assistance Sitting-balance support: Feet supported;Single extremity supported;No upper extremity supported Sitting balance-Leahy Scale: Poor Sitting balance - Comments: Sitting at EOB with Min A for L lateral lean. Use of verbal, visual, and tactile cues for sitting posture   Standing balance support: Single extremity supported;During functional activity Standing balance-Leahy Scale: Zero                             ADL either performed or assessed with clinical judgement   ADL Overall ADL's : Needs assistance/impaired Eating/Feeding: Set up;Bed level Eating/Feeding Details (indicate cue type and reason): Assisting to set pt up for breakfast. Uponing containers and optimizing position to table. pt demonstrating ability to use fork with R hand and bring to mouth without difficulty                     Toilet Transfer: Total assistance;+2 for physical assistance (sit<>stand with stedy) Toilet Transfer Details (indicate cue type and reason): Total A +2 to power up into standing. Use of stedy. Once sitting on stedy, use of mirror to correct balance and achieve midline posture         Functional  mobility during ADLs: Maximal assistance;+2 for physical assistance General ADL Comments: Focused session on sitting balance at EOB and sit<>stands with use of stedy. Also applying Ktape to promote positioning of L shoulder and  scapula     Vision       Perception     Praxis      Cognition Arousal/Alertness: Awake/alert Behavior During Therapy: Flat affect Overall Cognitive Status: Impaired/Different from baseline Area of Impairment: Following commands;Attention;Safety/judgement;Problem solving;Awareness;Memory                   Current Attention Level: Sustained Memory: Decreased short-term memory Following Commands: Follows one step commands with increased time Safety/Judgement: Decreased awareness of safety Awareness: Emergent Problem Solving: Slow processing;Requires verbal cues;Requires tactile cues;Difficulty sequencing;Decreased initiation General Comments: Eager to participate in therapy. Following simple commands. Fatigues quickly        Exercises Exercises: Other exercises Other Exercises Other Exercises: K tape applied to promote scapular retraction at begining of session Other Exercises: K tape applied to shoulder to decreased pain and promote optimized positioning at shoulder. Other Exercises: R lateral leaning and posture correcting to midline; use of mirror; Min-Mod A for posture correction; visual, verbal, and tactile cues provided; x5   Shoulder Instructions       General Comments      Pertinent Vitals/ Pain       Pain Assessment: Faces Faces Pain Scale: Hurts even more Pain Location: L hand and shoulder Pain Descriptors / Indicators: Discomfort;Grimacing Pain Intervention(s): Limited activity within patient's tolerance;Monitored during session;Repositioned  Home Living                                          Prior Functioning/Environment              Frequency  Min 5X/week        Progress Toward Goals  OT Goals(current goals can now be found in the care plan section)  Progress towards OT goals: Progressing toward goals  Acute Rehab OT Goals Patient Stated Goal: Patient wanting to move better and not have pain to her L hand OT  Goal Formulation: With patient Time For Goal Achievement: 07/10/21 Potential to Achieve Goals: Fair ADL Goals Pt Will Perform Grooming: sitting;with mod assist Additional ADL Goal #1: pt will static sit with mod (A) as precursor to adls Additional ADL Goal #2: pt will tolerate L resting hand splint for up to 6 hours on Additional ADL Goal #3: Pt will perform basic transfer with mod A +2 as a precursor to ADLs  Plan Discharge plan remains appropriate    Co-evaluation    PT/OT/SLP Co-Evaluation/Treatment: Yes Reason for Co-Treatment: For patient/therapist safety;To address functional/ADL transfers;Complexity of the patient's impairments (multi-system involvement)   OT goals addressed during session: ADL's and self-care      AM-PAC OT "6 Clicks" Daily Activity     Outcome Measure   Help from another person eating meals?: A Little Help from another person taking care of personal grooming?: A Little Help from another person toileting, which includes using toliet, bedpan, or urinal?: A Lot Help from another person bathing (including washing, rinsing, drying)?: A Lot Help from another person to put on and taking off regular upper body clothing?: A Lot Help from another person to put on and taking off regular lower body clothing?: Total 6 Click Score: 13    End of Session  OT Visit Diagnosis: Unsteadiness on feet (R26.81);Muscle weakness (generalized) (M62.81);Hemiplegia and hemiparesis Hemiplegia - Right/Left: Left Hemiplegia - dominant/non-dominant: Non-Dominant Hemiplegia - caused by: Cerebral infarction   Activity Tolerance Patient tolerated treatment well   Patient Left in bed;with call bell/phone within reach;with bed alarm set;Other (comment) (elevating LUE)   Nurse Communication Mobility status        Time: 3462-1947 OT Time Calculation (min): 51 min  Charges: OT General Charges $OT Visit: 1 Visit OT Treatments $Therapeutic Activity: 8-22 mins $Therapeutic  Exercise: 8-22 mins  [place], OTR/L Acute Rehab Pager: [phone] Office: [place] 07/03/2021, 12:35 PM

## 2021-07-03 NOTE — NC FL2 (Signed)
[place] MEDICAID FL2 LEVEL OF CARE SCREENING TOOL     IDENTIFICATION  [name] Birthdate: 1976-11-24 Sex: female Admission Date (Current Location): 05/15/2021  [facility] Series and [place] Number:  Herbalist and Address:  [facility] [place], [place] [address] [place], [place] [address]      Provider Number: [id number]  Attending Physician Name and Address:  Jonetta Osgood, MD  Relative Name and Phone Number:  [name], brother, [phone]    Current Level of Care: Hospital Recommended Level of Care: [place] Prior Approval Number:    Date Approved/Denied:   PASRR Number: [id number]  Discharge Plan: SNF    Current Diagnoses: Patient Active Problem List   Diagnosis Date Noted   Swelling of left hand    Diverticulitis of colon without hemorrhage    Proctocolitis    Constipation    Uterine fibroid    Left hemiparesis ([place])    Hemorrhagic stroke ([place])    Hemiparesis ([place])    Status post tracheostomy ([place])    Abnormal uterine bleeding (AUB)    Abdominal distention    Encounter for feeding tube placement    Encounter for intubation    Ileus ([place])    Staphylococcus epidermidis bacteremia    Fibroid 05/25/2021   Iron deficiency anemia due to chronic blood loss 05/25/2021   Cerebral edema ([place]) 05/25/2021   Chronic HFrEF (heart failure with reduced ejection fraction) ([place]) 05/25/2021   Pneumonia due to methicillin susceptible Staphylococcus aureus ([place]) 05/25/2021   Acute respiratory failure with hypoxia ([place])    Cerebrovascular accident (CVA) ([place]) 05/15/2021    Orientation RESPIRATION BLADDER Height & Weight     Self, Time, Situation, Place  Normal Incontinent, External catheter Weight: 158 lb 8.2 oz (71.9 kg) Height:  _0  (165.1 cm)  BEHAVIORAL SYMPTOMS/MOOD NEUROLOGICAL BOWEL NUTRITION STATUS      Incontinent Diet (Normal)  AMBULATORY STATUS COMMUNICATION OF NEEDS Skin   Extensive Assist  Non-Verbally (trach) Normal                       Personal Care Assistance Level of Assistance  Bathing, Feeding, Dressing Bathing Assistance: Maximum assistance Feeding assistance: Maximum assistance Dressing Assistance: Maximum assistance     Functional Limitations Info  Sight, Hearing, Speech Sight Info: Adequate Hearing Info: Adequate Speech Info: Adequate    SPECIAL CARE FACTORS FREQUENCY  PT (By licensed PT), OT (By licensed OT)     PT Frequency: 5x week OT Frequency: 5x week            Contractures Contractures Info: Not present    Additional Factors Info  Code Status, Allergies Code Status Info: Partial - no CPR Allergies Info: No known allergies           Current Medications (07/03/2021):  This is the current hospital active medication list Current Facility-Administered Medications  Medication Dose Route Frequency Provider Last Rate Last Admin   acetaminophen (TYLENOL) tablet 650 mg  650 mg Per Tube Q4H PRN Llana Aliment, RPH   650 mg at 07/02/21 2112   Or   acetaminophen (TYLENOL) 160 MG/5ML solution 650 mg  650 mg Per Tube Q4H PRN Llana Aliment, RPH   650 mg at 06/30/21 0007   Or   acetaminophen (TYLENOL) suppository 650 mg  650 mg Rectal Q4H PRN Llana Aliment, RPH       amiodarone (PACERONE) tablet 200 mg  200 mg Oral Daily Lissa Merlin,  Leisa Lenz, NP   200 mg at 07/02/21 0845   amLODipine (NORVASC) tablet 10 mg  10 mg Oral Daily Samella Parr, NP   10 mg at 07/02/21 0844   aspirin chewable tablet 81 mg  81 mg Oral Daily Samella Parr, NP   81 mg at 07/02/21 0845   atorvastatin (LIPITOR) tablet 40 mg  40 mg Oral Daily Samella Parr, NP   40 mg at 07/02/21 0845   carvedilol (COREG) tablet 25 mg  25 mg Oral BID WC Samella Parr, NP   25 mg at 07/02/21 1622   chlorhexidine gluconate (MEDLINE KIT) (PERIDEX) 0.12 % solution 15 mL  15 mL Mouth Rinse BID Frederik Pear, MD   15 mL at 07/02/21 0846   Chlorhexidine Gluconate Cloth 2 % PADS 6  each  6 each Topical Daily Greta Doom, MD   6 each at 07/02/21 0846   cloNIDine (CATAPRES) tablet 0.1 mg  0.1 mg Oral TID Samella Parr, NP   0.1 mg at 07/02/21 2111   feeding supplement (ENSURE ENLIVE / ENSURE PLUS) liquid 237 mL  237 mL Oral TID BM Thurnell Lose, MD   237 mL at 07/02/21 2119   ferrous sulfate tablet 325 mg  325 mg Oral BID WC Samella Parr, NP   325 mg at 07/02/21 1622   hydrALAZINE (APRESOLINE) tablet 100 mg  100 mg Oral Q8H Samella Parr, NP   100 mg at 07/03/21 6270   isosorbide dinitrate (ISORDIL) tablet 40 mg  40 mg Oral TID Samella Parr, NP   40 mg at 07/02/21 2238   lactulose ([place]) 10 GM/15ML solution 10 g  10 g Oral BID Samella Parr, NP   10 g at 07/02/21 2112   MEDLINE mouth rinse  15 mL Mouth Rinse 5 X Daily Jonetta Osgood, MD   15 mL at 07/03/21 3500   megestrol (MEGACE) tablet 80 mg  80 mg Oral TID Samella Parr, NP   80 mg at 07/02/21 2111   multivitamin with minerals tablet 1 tablet  1 tablet Oral Daily Samella Parr, NP   1 tablet at 07/02/21 0845   ondansetron (ZOFRAN) injection 4 mg  4 mg Intravenous Q6H PRN Reubin Milan, MD   4 mg at 06/19/21 1731   oxyCODONE (Oxy IR/ROXICODONE) immediate release tablet 7.5 mg  7.5 mg Oral Q4H PRN Samella Parr, NP   7.5 mg at 07/02/21 1356   pantoprazole (PROTONIX) EC tablet 40 mg  40 mg Oral Daily Samella Parr, NP   40 mg at 07/02/21 0844   predniSONE (DELTASONE) tablet 50 mg  50 mg Oral Q breakfast Samella Parr, NP   50 mg at 07/02/21 0844   sacubitril-valsartan (ENTRESTO) 97-103 mg per tablet  1 tablet Oral BID Samella Parr, NP   1 tablet at 07/02/21 2112   simethicone (MYLICON) 40 XF/8.1WE suspension 40 mg  40 mg Oral Q6H PRN Samella Parr, NP       sodium chloride flush (NS) 0.9 % injection 10-40 mL  10-40 mL Intracatheter Q12H Debbe Odea, MD   10 mL at 07/02/21 2119   sodium chloride flush (NS) 0.9 % injection 10-40 mL  10-40 mL Intracatheter PRN  Debbe Odea, MD         Discharge Medications: Please see discharge summary for a list of discharge medications.  Relevant Imaging Results:  Relevant Lab Results:  Additional Information SSN: [id number]. Has not received any COVID vaccines.  Archie Endo, LCSW

## 2021-07-04 ENCOUNTER — Encounter (HOSPITAL_COMMUNITY): Payer: Self-pay

## 2021-07-10 ENCOUNTER — Telehealth: Payer: Self-pay

## 2021-07-10 NOTE — Telephone Encounter (Signed)
Palliative Medicine RN Note: Rec'd call from pt's brother [name]. He did not get a copy of her HCPOA, and neither did [employer]. I emailed him a copy at [email or web address], and I faxed a copy to [employer] at [place]. Arleen Bar, RN, BSN, [facility] Palliative Medicine Team 07/10/2021 10:54 AM Office [phone]

## 2021-08-13 ENCOUNTER — Encounter: Payer: Medicaid Other | Admitting: Obstetrics and Gynecology

## 2021-08-17 ENCOUNTER — Other Ambulatory Visit: Payer: Self-pay

## 2021-08-17 ENCOUNTER — Ambulatory Visit (INDEPENDENT_AMBULATORY_CARE_PROVIDER_SITE_OTHER): Payer: Medicaid - Out of State | Admitting: Family

## 2021-08-17 ENCOUNTER — Encounter (HOSPITAL_BASED_OUTPATIENT_CLINIC_OR_DEPARTMENT_OTHER): Payer: Self-pay | Admitting: Family

## 2021-08-17 VITALS — BP 91/49 | HR 76 | Resp 18 | Ht 65.0 in | Wt 146.0 lb

## 2021-08-17 DIAGNOSIS — I5022 Chronic systolic (congestive) heart failure: Secondary | ICD-10-CM | POA: Diagnosis not present

## 2021-08-17 DIAGNOSIS — Z79899 Other long term (current) drug therapy: Secondary | ICD-10-CM

## 2021-08-17 DIAGNOSIS — I471 Supraventricular tachycardia: Secondary | ICD-10-CM | POA: Diagnosis not present

## 2021-08-17 DIAGNOSIS — I10 Essential (primary) hypertension: Secondary | ICD-10-CM

## 2021-08-17 DIAGNOSIS — D500 Iron deficiency anemia secondary to blood loss (chronic): Secondary | ICD-10-CM

## 2021-08-17 DIAGNOSIS — I5082 Biventricular heart failure: Secondary | ICD-10-CM

## 2021-08-17 NOTE — Progress Notes (Signed)
Office Visit    [name] Date of Encounter: 08/17/2021  PCP:  Mitzi Hansen, MD   [place]  Cardiologist:  Freada Bergeron, MD  Advanced Practice Provider:  No care team member to display Electrophysiologist:  Vickie Epley, MD     Chief Complaint    Holly Bradley is a 45 y.o. 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  Not on beta-blocker or calcium channel blocker   Uterine fibroid    Past Surgical History:  Procedure Laterality Date   IR ANGIOGRAM FOLLOW UP STUDY  05/15/2021   IR CT HEAD LTD  05/15/2021   IR PERCUTANEOUS ART THROMBECTOMY/INFUSION INTRACRANIAL INC DIAG ANGIO  05/15/2021       IR PERCUTANEOUS ART THROMBECTOMY/INFUSION INTRACRANIAL INC DIAG ANGIO  05/15/2021   IR TRANSCATH/EMBOLIZ  05/15/2021   RADIOLOGY WITH ANESTHESIA N/A 05/15/2021   Procedure: IR WITH ANESTHESIA;  Surgeon: Radiologist, Medication, MD;  Location: [place];  Service: Radiology;  Laterality: N/A;   Unknown      Allergies  No Known Allergies  History of Present Illness    Holly Bradley is a 45 y.o. 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   Admitted 04/2020 with SVT. Occurred in the setting of severe anemia with Hb 4.4 due to uterine bleeding. Additionally was found to have low LVEF 20-25%. She was again admitted 12/2020 with dyspnea and palpitations in setting of SVT requiring adenosine. She was hypertensive with AKI and responded well to IV Lasix. Hospitalization complicated by episodes of NSVT.   She was seen in advanced heart failure clinic 02/14/21 volume overloaded with weight gain of 20 lbs. Entresto increased, Metolazone added, Spironolactone added.   Admitted 05/15/21 - 07/03/21 with confusion found to have right MCA occlusion underwent thrombecotmy complicated by contrast extravasation and M3 coiling embolization and revascularization. Subseuqently developed subarachnoid hemorrhage with imaging revealing right basal ganglia infarct with 52m left shift treated with hypertonic saline in ICU. She was intubated 05/17/21 and CT head showed increased cytotoxic edema and left side shift. No mental status improvement and trach was placed with nasal collar track for tube feedings. ID was consulted for MSSA pneumonia. On 06/17/21 she ewas decannulated and on 06/21/21 cortrack tube removed and tube feedings discontinued. She was discharged to SNF.   She presents today for ofllow up. [facility] She is appreciative of their care but notes she wishes her family could visit more often. It is difficult as her brother works 2 jobs. She denies chest pain, pressure, tightness. Reports episodes of lightheadedness. She continues to struggle with pain since hospital discharge. Denies shortness of breath or palpitations.  Notes edema to her left foot which she does keep elevated. She continues to work with therapy. Of note, the SNF sent her medication list but no recent vitals nor labs for my review unfortunately. The staff member from SNF present with [facility] only works in transportation and was not familiar with her  medical case.  EKGs/Labs/Other Studies Reviewed:   The following studies were reviewed today:  Echo 05/16/21 1. Left ventricular ejection fraction, by estimation, is 55 to 60%. The  left ventricle has normal function. The left ventricle has no regional  wall motion abnormalities. There is moderate concentric left ventricular  hypertrophy. Left ventricular  diastolic parameters are consistent with Grade II diastolic dysfunction  (pseudonormalization). Elevated left atrial pressure.   2. Right ventricular systolic function is normal. The right ventricular  size is normal. Mildly increased right ventricular wall thickness. There  is moderately elevated pulmonary artery systolic pressure. The estimated  right ventricular systolic pressure   is [id number] mmHg.   3. Left atrial size was severely dilated.   4. Right atrial size was severely dilated.   5. There is no evidence of cardiac tamponade.   6. The mitral valve is normal in structure. Trivial mitral valve  regurgitation. No evidence of mitral stenosis.   7. The aortic valve is normal in structure. There is mild calcification  of the aortic valve. There is mild thickening of the aortic valve. Aortic  valve regurgitation is not visualized. Mild aortic valve sclerosis is  present, with no evidence of aortic  valve stenosis.   8. The inferior vena cava is dilated in size with <50% respiratory  variability, suggesting right atrial pressure of 15 mmHg.   Echo 01/11/21  1. Left ventricular ejection fraction, by estimation, is 20 to 25%. The  left ventricle has severely decreased function. The left ventricle  demonstrates global hypokinesis. The left ventricular internal cavity size  was mildly to moderately dilated. There   is moderate concentric left ventricular hypertrophy. Left ventricular  diastolic parameters are consistent with Grade II diastolic dysfunction  (pseudonormalization). Elevated left ventricular end-diastolic pressure.  The  average left ventricular global  longitudinal strain is -5.8 %. The global longitudinal strain is abnormal.   2. Right ventricular systolic function is moderately reduced. The right  ventricular size is normal. There is moderately elevated pulmonary artery  systolic pressure.   3. Left atrial size was mildly dilated.   4. Right atrial size was mildly dilated.   5. Moderate pericardial effusion. The pericardial effusion is  circumferential. There is no evidence of cardiac tamponade.   6. The mitral valve is normal in structure. Mild mitral valve  regurgitation. No evidence of mitral stenosis.   7. The aortic valve is tricuspid. Aortic valve regurgitation is not  visualized. No aortic stenosis is present.   8. The inferior vena cava is dilated in size with <50% respiratory  variability, suggesting right atrial pressure of 15 mmHg.  EKG:  No EKG today.  Recent Labs: 01/23/2021: TSH 1.953 02/20/2021: B Natriuretic Peptide 3,032.5 06/18/2021: Magnesium 1.8 06/28/2021: ALT 23 07/01/2021: BUN 30; Creatinine, Ser 1.09; Potassium 4.2; Sodium 138 07/02/2021: Hemoglobin 9.7; Platelets 210  Recent Lipid Panel    Component Value Date/Time   CHOL 131 05/16/2021 0457   TRIG 81 05/24/2021 0447   HDL 19 (L) 05/16/2021 0457   CHOLHDL 6.9 05/16/2021 0457   VLDL 36 05/16/2021 0457   LDLCALC 76 05/16/2021 0457   Home Medications   Current Meds  Medication  Sig   acetaminophen (TYLENOL) 325 MG tablet Place 2 tablets (650 mg total) into feeding tube every 4 (four) hours as needed for mild pain (or temp > 37.5 C (99.5 F)).   amiodarone (PACERONE) 200 MG tablet Take 1 tablet (200 mg total) by mouth daily.   amiodarone (PACERONE) 200 MG tablet TAKE 1 TABLET (200 MG TOTAL) BY MOUTH DAILY.   amiodarone (PACERONE) 200 MG tablet Take 200 mg by mouth daily.   amLODipine (NORVASC) 10 MG tablet Take 1 tablet (10 mg total) by mouth daily.   ARIPiprazole (ABILIFY) 2 MG tablet Take 2 mg by mouth daily.   aspirin 81 MG  chewable tablet Chew 1 tablet (81 mg total) by mouth daily.   atorvastatin (LIPITOR) 40 MG tablet Take 1 tablet (40 mg total) by mouth daily.   carvedilol (COREG) 25 MG tablet Take 1 tablet (25 mg total) by mouth 2 (two) times daily with a meal.   cloNIDine (CATAPRES) 0.1 MG tablet Take 1 tablet (0.1 mg total) by mouth 3 (three) times daily.   ENTRESTO 97-103 MG Take 1 tablet by mouth 2 (two) times daily.   feeding supplement (ENSURE ENLIVE / ENSURE PLUS) LIQD Take 237 mLs by mouth 3 (three) times daily between meals.   FEROSUL 325 (65 Fe) MG tablet Take 325 mg by mouth 2 (two) times daily.   ferrous sulfate 325 (65 FE) MG tablet Take 1 tablet (325 mg total) by mouth 2 (two) times daily with a meal.   hydrALAZINE (APRESOLINE) 100 MG tablet Take 1 tablet (100 mg total) by mouth 3 (three) times daily.   hydrALAZINE (APRESOLINE) 100 MG tablet TAKE 1 TABLET (100 MG TOTAL) BY MOUTH 3 (THREE) TIMES DAILY.   hydrALAZINE (APRESOLINE) 100 MG tablet Take 100 mg by mouth 3 (three) times daily.   isosorbide mononitrate (IMDUR) 60 MG 24 hr tablet TAKE 1 & 1/2 TABLETS BY MOUTH ONCE DAILY.   isosorbide mononitrate (IMDUR) 60 MG 24 hr tablet Take 60 mg by mouth daily.   lactulose (CHRONULAC) 10 GM/15ML solution Take 15 mLs (10 g total) by mouth 2 (two) times daily.   megestrol (MEGACE) 40 MG tablet Take 40 mg by mouth 2 (two) times daily.   megestrol (MEGACE) 40 MG tablet Take 2 tablets (80 mg total) by mouth 3 (three) times daily.   metolazone (ZAROXOLYN) 2.5 MG tablet Take 1 tablet (2.5 mg total) by mouth 2 (two) times a week. Monday and Friday   metolazone (ZAROXOLYN) 2.5 MG tablet TAKE 1 TABLET (2.5 MG TOTAL) BY MOUTH 2 (TWO) TIMES A WEEK. MONDAY AND FRIDAY   metoprolol (TOPROL-XL) 200 MG 24 hr tablet TAKE 1 TABLET (200 MG TOTAL) BY MOUTH DAILY. TAKE WITH OR IMMEDIATELY FOLLOWING A MEAL.   Multiple Vitamin (MULTIVITAMIN WITH MINERALS) TABS tablet Take 1 tablet by mouth daily.   oxyCODONE HCl 7.5 MG TABS Take  7.5 mg by mouth every 4 (four) hours as needed.   pantoprazole (PROTONIX) 40 MG tablet Take 1 tablet (40 mg total) by mouth daily.   potassium chloride SA (KLOR-CON) 20 MEQ tablet Take 1 tablet (20 mEq total) by mouth 2 (two) times daily. With additional 40 meq with Metolazone (Mon/Fr)   potassium chloride SA (KLOR-CON) 20 MEQ tablet TAKE 1 TABLET (20 MEQ TOTAL) BY MOUTH 2 (TWO) TIMES DAILY. WITH ADDITIONAL 40 MEQ WITH METOLAZONE (MON/FR)   sacubitril-valsartan (ENTRESTO) 97-103 MG Take 1 tablet by mouth 2 (two) times daily.   simethicone (MYLICON) 40 [id number] drops Take  0.6 mLs (40 mg total) by mouth every 6 (six) hours as needed for flatulence.   torsemide (DEMADEX) 20 MG tablet TAKE 2 TABLETS (40 MG TOTAL) BY MOUTH 2 (TWO) TIMES DAILY.     Review of Systems      All other systems reviewed and are otherwise negative except as noted above.  Physical Exam    VS:  BP (!) 91/49   Pulse 76   Resp 18   Ht '5\' 5"'$  (1.651 m)   Wt 146 lb (66.2 kg)   LMP  (LMP Unknown)   SpO2 96%   BMI 24.30 kg/m  , BMI Body mass index is 24.3 kg/m.  Wt Readings from Last 3 Encounters:  08/17/21 146 lb (66.2 kg)  07/02/21 158 lb 8.2 oz (71.9 kg)  05/08/21 155 lb (70.3 kg)     GEN: Well nourished, well developed, in no acute distress. HEENT: normal. Neck: Supple, no JVD, carotid bruits, or masses. Cardiac: RRR, no murmurs, rubs, or gallops. No clubbing, cyanosis, edema.  Radials/PT 2+ and equal bilaterally.  Respiratory:  Respirations regular and unlabored. *Auscultation limited by patient position in wheelchair and left hemipalegia* GI: Soft, nontender, nondistended. MS: No deformity or atrophy. Skin: Warm and dry, no rash. Neuro:  Strength and sensation are intact.  Psych: Normal affect. Expressive aphasia.   Assessment & Plan    NICM / HFrEF with recovered LVEF - Grossly euvolemic on exam. Nonpitting pedal edema to left foot. Encouraged to continue to elevate. GDMT includes Coreg, Imdur,  Entresto, Hydralazine, Torsemide.   HTN - Now with hypotension symptomatic with occasional lightheadedness .Unfortunately no vitals available for review from SNF. We will stop Amlodipine as it does not have HF benefit and may be contributory to L pedal edema. Next step would ideally be to discontinue Clonidine given difficult dosing if needed.   SVT - Continue Amiodarone. Denies palpitaitons. No signs of toxicity. 01/23/21 TSH 1.953. 06/28/21 ALT 23, AST 25. Consider TSH at follow up for Q79mo monitoring.   Uterine fibroid / IDA- Multiple previous episodes of bleeding. Follow with GYN.  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 Still with left hemiplegia and expressive aphasia. Receiving therapy at SNF. Continue to follow with neurology. Continue Atorvastatin, Aspirin. Continue optimal BP control.   Disposition: Follow up  in 4-5 months  with Dr. PJohney Frameor APP.  Signed, CLoel Dubonnet NP 08/17/2021, 3:14 PM [facility]

## 2021-08-17 NOTE — Patient Instructions (Addendum)
Medication Instructions:  Your physician has recommended you make the following change in your medication:   STOP Amlodipine  *If you need a refill on your cardiac medications before your next appointment, please call your pharmacy*   Lab Work: Recommend TSH, CMP for Amiodarone monitoring if not collected since hospital discharge. This can be done at your facility.  Testing/Procedures: None ordered today  Your echocardiogram in the hospital showed your heart pumping function was back to normal. This is a great result!!  Follow-Up: At [facility], you and your health needs are our priority.  As part of our continuing mission to provide you with exceptional heart care, we have created designated Provider Care Teams.  These Care Teams include your primary Cardiologist (physician) and Advanced Practice Providers (APPs -  Physician Assistants and Nurse Practitioners) who all work together to provide you with the care you need, when you need it.  We recommend signing up for the patient portal called "MyChart".  Sign up information is provided on this After Visit Summary.  MyChart is used to connect with patients for Virtual Visits (Telemedicine).  Patients are able to view lab/test results, encounter notes, upcoming appointments, etc.  Non-urgent messages can be sent to your provider as well.   To learn more about what you can do with MyChart, go to [email or web address].    Your next appointment:   Loel Dubonnet, NP will reach out to the Advanced Heart Failure clinic about scheduling appointment. AND In 4-5 months with Dr. Johney Frame or Loel Dubonnet, NP   Other Instructions  Follow up with neurology as scheduled.  Heart Healthy Diet Recommendations: A low-salt diet is recommended. Meats should be grilled, baked, or boiled. Avoid fried foods. Focus on lean protein sources like fish or chicken with vegetables and fruits.   To prevent or reduce lower extremity swelling: Eat a  low salt diet. Salt makes the body hold onto extra fluid which causes swelling. Sit with legs elevated. For example, in the recliner or on an [place].  Wear knee-high compression stockings during the daytime. Ones labeled 15-20 mmHg provide good compression.

## 2021-08-19 ENCOUNTER — Encounter (HOSPITAL_BASED_OUTPATIENT_CLINIC_OR_DEPARTMENT_OTHER): Payer: Self-pay | Admitting: Family

## 2021-09-26 ENCOUNTER — Encounter: Payer: Self-pay | Admitting: Neurology

## 2021-09-26 ENCOUNTER — Ambulatory Visit (INDEPENDENT_AMBULATORY_CARE_PROVIDER_SITE_OTHER): Payer: Self-pay | Admitting: Neurology

## 2021-09-26 VITALS — BP 147/88 | HR 88

## 2021-09-26 DIAGNOSIS — I69352 Hemiplegia and hemiparesis following cerebral infarction affecting left dominant side: Secondary | ICD-10-CM

## 2021-09-26 DIAGNOSIS — I63411 Cerebral infarction due to embolism of right middle cerebral artery: Secondary | ICD-10-CM

## 2021-09-26 NOTE — Progress Notes (Signed)
[facility] [address] [place]. [place] [address] [phone]       OFFICE FOLLOW-UP NOTE  Ms. Deneka Greenwalt Date of Birth:  08/12/1976 Medical Record Number:  [id number]   HPI: Ms. Resnick is a 45 year old African-American lady seen today for initial office visit following hospitalization for stroke in May 2022.  She is alone today and her brother was supposed to accompany but could not make it.  History is obtained from the patient and review of electronic medical records and opossum reviewed pertinent available imaging films in PACS.  She has past medical history of hypertension, tobacco abuse, uterine fibroids, congestive heart failure, paroxysmal SVT, paranoid psychosis who presented with altered mental status on 05/15/2021.  She was unable to provide history and appeared quite somnolent during initial neurological evaluation but clearly had left hemiplegia.  Roommates called EMS after they found her lying unresponsive in an upstairs bathroom and the last seen normal was the night prior.  She was wedged between the toilet and the sink and was minimally responsive to pain.  Her systolic blood pressure was elevated in the 536U and diastolic in the 440H.  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  Her NIH stroke scale on admission was 9.  Given patient's young age she was taken for emergent mechanical thrombectomy.  Mechanical thrombectomy was performed for treatment of right M1 occlusion with clot fragmentation and embolization to the same distal territory requiring multiple additional passes.  Procedure was complicated by contrast extravasation at the distal right M3 branch requiring vessel sacrifice with  embolization.  Near complete revascularization of the right MCA vascular tree with TICI 2B was achieved.  However MRI scan of the brain subsequently showed a moderate sized large right MCA infarct with some petechial hemorrhage as well as localized subarachnoid hemorrhage.  Patient has significant cytotoxic edema she remained intubated for several days with treatment with hypertonic saline and management of respiratory failure by CCM.  She eventually required tracheostomy and PEG tube placement.  She also had significant bleeding and anemia because she had known history of large uterine fibroid.  She required blood transfusion for that.  Hypercoagulable labs showed mildly positive lupus anticoagulant in the titer of 1.2 and borderline IgM anticardiolipin antibodies.  She was not started on anticoagulation but plan was to repeat this in follow-up.  Patient was transferred to [facility] from skilled nursing facility where she is currently staying.  She is getting physical Occupational Therapy every day.  She is yet not able to stand and walk.  She still has dense left hemiplegia.  Her speech has improved and her tracheostomy as well as PEG tube have been discontinued.  She is able to swallow.  She can speak but has still some dysarthria.  She is still wheelchair-bound and bedridden and needs total care.  She is on aspirin currently for stroke prevention.  Blood pressure is well controlled today it is slightly high at 147/88.  ROS:   14 system review of systems is positive for weakness, dysarthria, dysphagia, pain, hyperesthesia all other systems negative  PMH:  Past Medical History:  Diagnosis Date   CHF (congestive heart failure) (HCC)    Chronic blood loss anemia  Related to uterine fibroids   Essential hypertension    Poorly controlled, repeat by report only on nicardipine 90 mg   Fibroid    Likely complicated by by menometrorrhagia and chronic anemia   Hypertension    Paroxysmal SVT (supraventricular  tachycardia) (HCC)    Frequent episodes, can last anywhere from 20 minutes to 12-15 hours.  Not on beta-blocker or calcium channel blocker   Uterine fibroid     Social History:  Social History   Socioeconomic History   Marital status: Single    Spouse name: Not on file   Number of children: 0   Years of education: Not on file   Highest education level: Not on file  Occupational History   Not on file  Tobacco Use   Smoking status: Every Day   Smokeless tobacco: Never  Vaping Use   Vaping Use: Never used  Substance and Sexual Activity   Alcohol use: Not Currently    Comment: Occasionally   Drug use: Not Currently    Types: Marijuana   Sexual activity: Not on file  Other Topics Concern   Not on file  Social History Narrative   ** Merged History Encounter **       Social Determinants of Health   Financial Resource Strain: High Risk   Difficulty of Paying Living Expenses: Very hard  Food Insecurity: No Food Insecurity   Worried About Charity fundraiser in the Last Year: Never true   Ran Out of Food in the Last Year: Never true  Transportation Needs: No Transportation Needs   Lack of Transportation (Medical): No   Lack of Transportation (Non-Medical): No  Physical Activity: Not on file  Stress: Not on file  Social Connections: Not on file  Intimate Partner Violence: Not on file    Medications:   Current Outpatient Medications on File Prior to Visit  Medication Sig Dispense Refill   acetaminophen (TYLENOL) 325 MG tablet Place 2 tablets (650 mg total) into feeding tube every 4 (four) hours as needed for mild pain (or temp > 37.5 C (99.5 F)).     amiodarone (PACERONE) 200 MG tablet Take 200 mg by mouth daily.     ARIPiprazole (ABILIFY) 2 MG tablet Take 2 mg by mouth daily.     aspirin 81 MG chewable tablet Chew 1 tablet (81 mg total) by mouth daily.     atorvastatin (LIPITOR) 40 MG tablet Take 1 tablet (40 mg total) by mouth daily.     baclofen (LIORESAL) 20 MG tablet  Take 20 mg by mouth 3 (three) times daily.     carvedilol (COREG) 25 MG tablet Take 1 tablet (25 mg total) by mouth 2 (two) times daily with a meal.     cloNIDine (CATAPRES) 0.1 MG tablet Take 1 tablet (0.1 mg total) by mouth 3 (three) times daily. 60 tablet 11   ENTRESTO 97-103 MG Take 1 tablet by mouth 2 (two) times daily.     feeding supplement (ENSURE ENLIVE / ENSURE PLUS) LIQD Take 237 mLs by mouth 3 (three) times daily between meals. 237 mL 12   FEROSUL 325 (65 Fe) MG tablet Take 325 mg by mouth 2 (two) times daily.     ferrous sulfate 325 (65 FE) MG tablet Take 1 tablet (325 mg total) by mouth 2 (two) times daily with a meal. 60 tablet 3   gabapentin (NEURONTIN) 100 MG capsule Take 100 mg by mouth 3 (three) times daily.     hydrALAZINE (APRESOLINE)  100 MG tablet Take 1 tablet (100 mg total) by mouth 3 (three) times daily. 90 tablet 3   isosorbide mononitrate (IMDUR) 60 MG 24 hr tablet Take 60 mg by mouth daily.     lactulose (CHRONULAC) 10 GM/15ML solution Take 15 mLs (10 g total) by mouth 2 (two) times daily. 236 mL 0   megestrol (MEGACE) 40 MG tablet Take 40 mg by mouth 2 (two) times daily.     Multiple Vitamin (MULTIVITAMIN WITH MINERALS) TABS tablet Take 1 tablet by mouth daily.     oxyCODONE HCl 7.5 MG TABS Take 7.5 mg by mouth every 4 (four) hours as needed. 30 tablet 0   sacubitril-valsartan (ENTRESTO) 97-103 MG Take 1 tablet by mouth 2 (two) times daily. 60 tablet 3   simethicone (MYLICON) 40 PX/1.0GY drops Take 0.6 mLs (40 mg total) by mouth every 6 (six) hours as needed for flatulence. 30 mL 0   torsemide (DEMADEX) 20 MG tablet TAKE 2 TABLETS (40 MG TOTAL) BY MOUTH 2 (TWO) TIMES DAILY. 120 tablet 2   traMADol (ULTRAM) 50 MG tablet Take by mouth every 6 (six) hours as needed.     No current facility-administered medications on file prior to visit.    Allergies:  No Known Allergies  Physical Exam General: Frail middle-aged African-American lady., seated, in no evident  distress Head: head normocephalic and atraumatic.  Neck: supple with no carotid or supraclavicular bruits.  Healed tracheostomy scar in the neck in midline. Cardiovascular: regular rate and rhythm, no murmurs Musculoskeletal: no deformity Skin:  no rash/petichiae Vascular:  Normal pulses all extremities Vitals:   09/26/21 0956  BP: (!) 147/88  Pulse: 88   Neurologic Exam Mental Status: Awake and fully alert. Oriented to place and time. Recent and remote memory diminished attention span, concentration and fund of knowledge slightly diminished. Mood and affect appropriate.  Moderate dysarthria but can be understood.  Follows simple 1 and occasional two-step commands. Cranial Nerves: Fundoscopic exam reveals sharp disc margins. Pupils equal, briskly reactive to light. Extraocular movements full without nystagmus. Visual fields show left homonymous hemianopsia to confrontation. Hearing intact. Facial sensation intact.  Moderate left lower facial weakness., tongue, palate moves normally and symmetrically.  Motor: Normal strength bulk and tone in the right upper extremity only.  Dense left hemiplegia with 0/5 strength left upper and lower extremity with mild increase in tone.  Right lower extremity weak proximally and distally 2/5 but effort is poor and variable.    Sensory.: intact to touch ,pinprick .position and vibratory sensation.  But mild subjective hyperesthesia on the left side. Coordination: Rapid alternating movements normal in in the right upper extremity only and cannot be tested in the others.   Gait and Station: Unable to arise from a chair and she is wheelchair-bound..  Reflexes: 1+ and asymmetric and slightly brisker on the left.. Toes downgoing.   NIHSS  20 Modified Rankin  4   ASSESSMENT: 45 year old African-American lady with large right middle cerebral artery infarct and May 2022 secondary to right middle cerebral artery occlusion treated with mechanical thrombectomy  complicated by right M3 vessel rupture requiring vessel sacrifice with embolization.  Prolonged hospitalization complicated by cytotoxic cerebral edema, respiratory failure, dysphagia, acute blood loss anemia.  Vascular risk factors of smoking, hypertension, borderline hyperlipidemia and history of CHF.  Patient unfortunately has not made any significant improvement and is wheelchair-bound with significant left hemiplegia    PLAN:I had a long d/w patient about her recent stroke, left hemiplegia,risk for recurrent  stroke/TIAs, personally independently reviewed imaging studies and stroke evaluation results and answered.  Questions.  I spoke to the patient's brother Annetta Deiss over the phone and answered questions..Continue aspirin 81 mg daily  for secondary stroke prevention and maintain strict control of hypertension with blood pressure goal below 130/90, diabetes with hemoglobin A1c goal below 6.5% and lipids with LDL cholesterol goal below 70 mg/dL. I also advised the patient to eat a healthy diet with plenty of whole grains, cereals, fruits and vegetables, exercise regularly and maintain ideal body weight I recommend we continue physical and occupational therapy.  Recommend repeat hypercoagulable panel labs as her lupus anticoagulant and anticardiolipin antibodies were mildly abnormal in the past.  Followup in the future with me in only as needed and no schedule appointment was made.Greater than 50% of time during this 40 minute visit was spent on counseling,explanation of diagnosis, planning of further management, discussion with patient and family and coordination of care Antony Contras, MD Note: This document was prepared with digital dictation and possible smart phrase technology. Any transcriptional errors that result from this process are unintentional

## 2021-09-26 NOTE — Patient Instructions (Signed)
I had a long d/w patient about her recent stroke, left hemiplegia,risk for recurrent stroke/TIAs, personally independently reviewed imaging studies and stroke evaluation results and answered questions.Continue aspirin 81 mg daily  for secondary stroke prevention and maintain strict control of hypertension with blood pressure goal below 130/90, diabetes with hemoglobin A1c goal below 6.5% and lipids with LDL cholesterol goal below 70 mg/dL. I also advised the patient to eat a healthy diet with plenty of whole grains, cereals, fruits and vegetables, exercise regularly and maintain ideal body weight .  Repeat hypercoagulable panel labs as her lupus anticoagulant and anticardiolipin antibodies were mildly abnormal in the past.  Followup in the future with me in only as needed and no schedule appointment was made. Stroke Prevention Some medical conditions and behaviors are associated with a higher chance of having a stroke. You can help prevent a stroke by making nutrition, lifestyle, and other changes, including managing any medical conditions you may have. What nutrition changes can be made?  Eat healthy foods. You can do this by: Choosing foods high in fiber, such as fresh fruits and vegetables and whole grains. Eating at least 5 or more servings of fruits and vegetables a day. Try to fill half of your plate at each meal with fruits and vegetables. Choosing lean protein foods, such as lean cuts of meat, poultry without skin, fish, tofu, beans, and nuts. Eating low-fat dairy products. Avoiding foods that are high in salt (sodium). This can help lower blood pressure. Avoiding foods that have saturated fat, trans fat, and cholesterol. This can help prevent high cholesterol. Avoiding processed and premade foods. Follow your health care provider's specific guidelines for losing weight, controlling high blood pressure (hypertension), lowering high cholesterol, and managing diabetes. These may include: Reducing  your daily calorie intake. Limiting your daily sodium intake to 1,500 milligrams (mg). Using only healthy fats for cooking, such as olive oil, canola oil, or sunflower oil. Counting your daily carbohydrate intake. What lifestyle changes can be made? Maintain a healthy weight. Talk to your health care provider about your ideal weight. Get at least 30 minutes of moderate physical activity at least 5 days a week. Moderate activity includes brisk walking, biking, and swimming. Do not use any products that contain nicotine or tobacco, such as cigarettes and e-cigarettes. If you need help quitting, ask your health care provider. It may also be helpful to avoid exposure to secondhand smoke. Limit alcohol intake to no more than 1 drink a day for nonpregnant women and 2 drinks a day for men. One drink equals 12 oz of beer, 5 oz of wine, or 1 oz of hard liquor. Stop any illegal drug use. Avoid taking birth control pills. Talk to your health care provider about the risks of taking birth control pills if: You are over 56 years old. You smoke. You get migraines. You have ever had a blood clot. What other changes can be made? Manage your cholesterol levels. Eating a healthy diet is important for preventing high cholesterol. If cholesterol cannot be managed through diet alone, you may also need to take medicines. Take any prescribed medicines to control your cholesterol as told by your health care provider. Manage your diabetes. Eating a healthy diet and exercising regularly are important parts of managing your blood sugar. If your blood sugar cannot be managed through diet and exercise, you may need to take medicines. Take any prescribed medicines to control your diabetes as told by your health care provider. Control your hypertension. To  reduce your risk of stroke, try to keep your blood pressure below 130/80. Eating a healthy diet and exercising regularly are an important part of controlling your blood  pressure. If your blood pressure cannot be managed through diet and exercise, you may need to take medicines. Take any prescribed medicines to control hypertension as told by your health care provider. Ask your health care provider if you should monitor your blood pressure at home. Have your blood pressure checked every year, even if your blood pressure is normal. Blood pressure increases with age and some medical conditions. Get evaluated for sleep disorders (sleep apnea). Talk to your health care provider about getting a sleep evaluation if you snore a lot or have excessive sleepiness. Take over-the-counter and prescription medicines only as told by your health care provider. Aspirin or blood thinners (antiplatelets or anticoagulants) may be recommended to reduce your risk of forming blood clots that can lead to stroke. Make sure that any other medical conditions you have, such as atrial fibrillation or atherosclerosis, are managed. What are the warning signs of a stroke? The warning signs of a stroke can be easily remembered as BEFAST. B is for balance. Signs include: Dizziness. Loss of balance or coordination. Sudden trouble walking. E is for eyes. Signs include: A sudden change in vision. Trouble seeing. F is for face. Signs include: Sudden weakness or numbness of the face. The face or eyelid drooping to one side. A is for arms. Signs include: Sudden weakness or numbness of the arm, usually on one side of the body. S is for speech. Signs include: Trouble speaking (aphasia). Trouble understanding. T is for time. These symptoms may represent a serious problem that is an emergency. Do not wait to see if the symptoms will go away. Get medical help right away. Call your local emergency services (911 in the [place] Do not drive yourself to the hospital. Other signs of stroke may include: A sudden, severe headache with no known cause. Nausea or vomiting. Seizure. Where to find more  information For more information, visit: American Stroke Association: www.strokeassociation.org National Stroke Association: www.stroke.org Summary You can prevent a stroke by eating healthy, exercising, not smoking, limiting alcohol intake, and managing any medical conditions you may have. Do not use any products that contain nicotine or tobacco, such as cigarettes and e-cigarettes. If you need help quitting, ask your health care provider. It may also be helpful to avoid exposure to secondhand smoke. Remember BEFAST for warning signs of stroke. Get help right away if you or a loved one has any of these signs. This information is not intended to replace advice given to you by your health care provider. Make sure you discuss any questions you have with your health care provider. Document Revised: 11/21/2017 Document Reviewed: 01/14/2017 Elsevier Patient Education  2021 [employer].

## 2021-10-05 ENCOUNTER — Other Ambulatory Visit: Payer: Self-pay | Admitting: Neurology

## 2021-10-05 DIAGNOSIS — I6300 Cerebral infarction due to thrombosis of unspecified precerebral artery: Secondary | ICD-10-CM

## 2021-10-05 LAB — HYPERCOAGULABLE PANEL, COMPREHENSIVE
APTT: 31.3 s — ABNORMAL HIGH
AT III Act/Nor PPP Chro: 167 % — ABNORMAL HIGH
Act. Prt C Resist w/FV Defic.: 2.5 ratio
Anticardiolipin Ab, IgG: <10 [GPL'U]
Anticardiolipin Ab, IgM: 15 [MPL'U]
Beta-2 Glycoprotein I, IgA: <10 SAU
Beta-2 Glycoprotein I, IgG: <10 SGU
Beta-2 Glycoprotein I, IgM: <10 SMU
DRVVT Confirm Seconds: 74.9 s
DRVVT Ratio: 1.7 ratio — ABNORMAL HIGH
DRVVT Screen Seconds: 134 s — ABNORMAL HIGH
Factor VII Antigen**: 89 %
Factor VIII Activity: 229 % — ABNORMAL HIGH
Hexagonal Phospholipid Neutral: 5 s
Homocysteine: 13.2 umol/L
Prot C Ag Act/Nor PPP Imm: 82 %
Prot S Ag Act/Nor PPP Imm: 96 %
Protein C Ag/FVII Ag Ratio**: 0.9 ratio
Protein S Ag/FVII Ag Ratio**: 1.1 ratio

## 2021-10-08 ENCOUNTER — Telehealth: Payer: Self-pay | Admitting: *Deleted

## 2021-10-08 NOTE — Telephone Encounter (Signed)
-----   Message from Garvin Fila, MD sent at 10/05/2021  4:07 PM EDT ----- Holly Bradley Heir inform the patient that lab work for abnormal clotting suggests persistently abnormal lupus anticoagulant which may put her at increased risk for blood clots in the future.  I will refer her to hematologist to opine whether she needs long-term anticoagulation.  She will ----- Message ----- From: Interface, [facility] Lab Results In Sent: 10/05/2021   5:37 AM EDT To: Garvin Fila, MD

## 2021-10-08 NOTE — Telephone Encounter (Signed)
I spoke to her brother who informed me the patient does not have a phone at this time. She is currently doing in-patient rehab at [facility] (([phone]).   I called and spoke to Lauren at the facility. She verbalized understanding of the findings. States the patient is now on Eliquis 5mg , one tab BID. Hx of DVT.    The results have also been faxed to her attention at [phone]). They will follow up on the hematology referral.

## 2021-10-08 NOTE — Telephone Encounter (Signed)
I called and spoke to Lauren to let her know that we have been trying to fax but keep receiving a "Fax Failed" message. Lauren says that she has been receiving the faxes, but needs the actual lab values faxed over now. I have faxed them to her at the previous number provided, as well as a new number she gave me, [phone].

## 2021-10-08 NOTE — Telephone Encounter (Signed)
Lauren from [facility] called stating she has not yet received the results that were faxed over, wanting to know if they could be sent over again.

## 2021-10-09 ENCOUNTER — Other Ambulatory Visit: Payer: Self-pay

## 2021-10-09 ENCOUNTER — Inpatient Hospital Stay (HOSPITAL_COMMUNITY)
Admission: EM | Admit: 2021-10-09 | Discharge: 2021-10-27 | DRG: 749 | Disposition: A | Discharge status: Skilled Nursing Facility | Payer: Medicaid - Out of State | Source: Skilled Nursing Facility | Attending: Internal Medicine | Admitting: Internal Medicine

## 2021-10-09 ENCOUNTER — Encounter (HOSPITAL_COMMUNITY): Payer: Self-pay

## 2021-10-09 ENCOUNTER — Emergency Department (HOSPITAL_COMMUNITY): Payer: Medicaid - Out of State

## 2021-10-09 DIAGNOSIS — Z79899 Other long term (current) drug therapy: Secondary | ICD-10-CM

## 2021-10-09 DIAGNOSIS — K5900 Constipation, unspecified: Secondary | ICD-10-CM | POA: Diagnosis present

## 2021-10-09 DIAGNOSIS — I69322 Dysarthria following cerebral infarction: Secondary | ICD-10-CM

## 2021-10-09 DIAGNOSIS — I4891 Unspecified atrial fibrillation: Secondary | ICD-10-CM | POA: Diagnosis present

## 2021-10-09 DIAGNOSIS — N920 Excessive and frequent menstruation with regular cycle: Principal | ICD-10-CM | POA: Diagnosis present

## 2021-10-09 DIAGNOSIS — I110 Hypertensive heart disease with heart failure: Secondary | ICD-10-CM | POA: Diagnosis present

## 2021-10-09 DIAGNOSIS — I10 Essential (primary) hypertension: Secondary | ICD-10-CM | POA: Diagnosis present

## 2021-10-09 DIAGNOSIS — I5032 Chronic diastolic (congestive) heart failure: Secondary | ICD-10-CM | POA: Diagnosis present

## 2021-10-09 DIAGNOSIS — G8194 Hemiplegia, unspecified affecting left nondominant side: Secondary | ICD-10-CM | POA: Diagnosis present

## 2021-10-09 DIAGNOSIS — I429 Cardiomyopathy, unspecified: Secondary | ICD-10-CM | POA: Diagnosis present

## 2021-10-09 DIAGNOSIS — Z79818 Long term (current) use of other agents affecting estrogen receptors and estrogen levels: Secondary | ICD-10-CM

## 2021-10-09 DIAGNOSIS — D649 Anemia, unspecified: Secondary | ICD-10-CM

## 2021-10-09 DIAGNOSIS — R140 Abdominal distension (gaseous): Secondary | ICD-10-CM

## 2021-10-09 DIAGNOSIS — Z993 Dependence on wheelchair: Secondary | ICD-10-CM

## 2021-10-09 DIAGNOSIS — D259 Leiomyoma of uterus, unspecified: Secondary | ICD-10-CM | POA: Diagnosis present

## 2021-10-09 DIAGNOSIS — Z5320 Procedure and treatment not carried out because of patient's decision for unspecified reasons: Secondary | ICD-10-CM | POA: Diagnosis not present

## 2021-10-09 DIAGNOSIS — R32 Unspecified urinary incontinence: Secondary | ICD-10-CM | POA: Diagnosis present

## 2021-10-09 DIAGNOSIS — D72829 Elevated white blood cell count, unspecified: Secondary | ICD-10-CM

## 2021-10-09 DIAGNOSIS — I82412 Acute embolism and thrombosis of left femoral vein: Secondary | ICD-10-CM | POA: Diagnosis present

## 2021-10-09 DIAGNOSIS — L89626 Pressure-induced deep tissue damage of left heel: Secondary | ICD-10-CM | POA: Diagnosis present

## 2021-10-09 DIAGNOSIS — D251 Intramural leiomyoma of uterus: Secondary | ICD-10-CM | POA: Diagnosis present

## 2021-10-09 DIAGNOSIS — I69354 Hemiplegia and hemiparesis following cerebral infarction affecting left non-dominant side: Secondary | ICD-10-CM

## 2021-10-09 DIAGNOSIS — D62 Acute posthemorrhagic anemia: Secondary | ICD-10-CM | POA: Diagnosis present

## 2021-10-09 DIAGNOSIS — F17200 Nicotine dependence, unspecified, uncomplicated: Secondary | ICD-10-CM | POA: Diagnosis present

## 2021-10-09 DIAGNOSIS — D252 Subserosal leiomyoma of uterus: Secondary | ICD-10-CM | POA: Diagnosis present

## 2021-10-09 DIAGNOSIS — I471 Supraventricular tachycardia: Secondary | ICD-10-CM | POA: Diagnosis present

## 2021-10-09 DIAGNOSIS — L89322 Pressure ulcer of left buttock, stage 2: Secondary | ICD-10-CM | POA: Diagnosis present

## 2021-10-09 DIAGNOSIS — Z20822 Contact with and (suspected) exposure to covid-19: Secondary | ICD-10-CM | POA: Diagnosis present

## 2021-10-09 DIAGNOSIS — R760 Raised antibody titer: Secondary | ICD-10-CM

## 2021-10-09 DIAGNOSIS — D6862 Lupus anticoagulant syndrome: Secondary | ICD-10-CM | POA: Diagnosis present

## 2021-10-09 DIAGNOSIS — I619 Nontraumatic intracerebral hemorrhage, unspecified: Secondary | ICD-10-CM | POA: Diagnosis present

## 2021-10-09 DIAGNOSIS — I871 Compression of vein: Secondary | ICD-10-CM | POA: Diagnosis present

## 2021-10-09 DIAGNOSIS — I82422 Acute embolism and thrombosis of left iliac vein: Secondary | ICD-10-CM

## 2021-10-09 DIAGNOSIS — H53462 Homonymous bilateral field defects, left side: Secondary | ICD-10-CM | POA: Diagnosis present

## 2021-10-09 DIAGNOSIS — L8990 Pressure ulcer of unspecified site, unspecified stage: Secondary | ICD-10-CM | POA: Insufficient documentation

## 2021-10-09 DIAGNOSIS — Z7982 Long term (current) use of aspirin: Secondary | ICD-10-CM

## 2021-10-09 LAB — CBC WITH DIFFERENTIAL/PLATELET
Abs Immature Granulocytes: 0.05 10*3/uL (ref 0.00–0.07)
Abs Immature Granulocytes: 0.06 10*3/uL (ref 0.00–0.07)
Basophils Absolute: 0 10*3/uL (ref 0.0–0.1)
Basophils Absolute: 0 10*3/uL (ref 0.0–0.1)
Basophils Relative: 0 %
Basophils Relative: 0 %
Eosinophils Absolute: 0 10*3/uL (ref 0.0–0.5)
Eosinophils Absolute: 0 10*3/uL (ref 0.0–0.5)
Eosinophils Relative: 0 %
Eosinophils Relative: 0 %
HCT: 11.1 % — ABNORMAL LOW (ref 36.0–46.0)
HCT: 12 % — ABNORMAL LOW (ref 36.0–46.0)
Hemoglobin: 2.9 g/dL — CL (ref 12.0–15.0)
Hemoglobin: 3 g/dL — CL (ref 12.0–15.0)
Immature Granulocytes: 1 %
Immature Granulocytes: 1 %
Lymphocytes Relative: 9 %
Lymphocytes Relative: 9 %
Lymphs Abs: 0.8 10*3/uL (ref 0.7–4.0)
Lymphs Abs: 0.8 10*3/uL (ref 0.7–4.0)
MCH: 21 pg — ABNORMAL LOW (ref 26.0–34.0)
MCH: 21.1 pg — ABNORMAL LOW (ref 26.0–34.0)
MCHC: 25 g/dL — ABNORMAL LOW (ref 30.0–36.0)
MCHC: 26.1 g/dL — ABNORMAL LOW (ref 30.0–36.0)
MCV: 80.4 fL (ref 80.0–100.0)
MCV: 84.5 fL (ref 80.0–100.0)
Monocytes Absolute: 0.7 10*3/uL (ref 0.1–1.0)
Monocytes Absolute: 0.8 10*3/uL (ref 0.1–1.0)
Monocytes Relative: 8 %
Monocytes Relative: 8 %
Neutro Abs: 7.4 10*3/uL (ref 1.7–7.7)
Neutro Abs: 7.7 10*3/uL (ref 1.7–7.7)
Neutrophils Relative %: 82 %
Neutrophils Relative %: 82 %
Platelets: 336 10*3/uL (ref 150–400)
Platelets: 336 10*3/uL (ref 150–400)
RBC: 1.38 MIL/uL — ABNORMAL LOW (ref 3.87–5.11)
RBC: 1.42 MIL/uL — ABNORMAL LOW (ref 3.87–5.11)
RDW: 19.4 % — ABNORMAL HIGH (ref 11.5–15.5)
RDW: 19.4 % — ABNORMAL HIGH (ref 11.5–15.5)
WBC: 9.1 10*3/uL (ref 4.0–10.5)
WBC: 9.2 10*3/uL (ref 4.0–10.5)
nRBC: 0.3 % — ABNORMAL HIGH (ref 0.0–0.2)
nRBC: 0.3 % — ABNORMAL HIGH (ref 0.0–0.2)

## 2021-10-09 LAB — COMPREHENSIVE METABOLIC PANEL
ALT: 10 U/L (ref 0–44)
AST: 17 U/L (ref 15–41)
Albumin: 3.3 g/dL — ABNORMAL LOW (ref 3.5–5.0)
Alkaline Phosphatase: 104 U/L (ref 38–126)
Anion gap: 9 (ref 5–15)
BUN: 20 mg/dL (ref 6–20)
CO2: 20 mmol/L — ABNORMAL LOW (ref 22–32)
Calcium: 9.3 mg/dL (ref 8.9–10.3)
Chloride: 111 mmol/L (ref 98–111)
Creatinine, Ser: 1.14 mg/dL — ABNORMAL HIGH (ref 0.44–1.00)
GFR, Estimated: 60 mL/min — ABNORMAL LOW (ref >60)
Glucose, Bld: 98 mg/dL (ref 70–99)
Potassium: 3.9 mmol/L (ref 3.5–5.1)
Sodium: 140 mmol/L (ref 135–145)
Total Bilirubin: 0.6 mg/dL (ref 0.3–1.2)
Total Protein: 7 g/dL (ref 6.5–8.1)

## 2021-10-09 LAB — PREPARE RBC (CROSSMATCH)

## 2021-10-09 LAB — PROTIME-INR
INR: 1.7 — ABNORMAL HIGH (ref 0.8–1.2)
Prothrombin Time: 20.3 seconds — ABNORMAL HIGH (ref 11.4–15.2)

## 2021-10-09 LAB — I-STAT BETA HCG BLOOD, ED (MC, WL, AP ONLY): I-stat hCG, quantitative: <5 m[IU]/mL (ref <5)

## 2021-10-09 LAB — LACTIC ACID, PLASMA
Lactic Acid, Venous: 0.6 mmol/L (ref 0.5–1.9)
Lactic Acid, Venous: 0.6 mmol/L (ref 0.5–1.9)

## 2021-10-09 LAB — APTT: aPTT: 46 seconds — ABNORMAL HIGH (ref 24–36)

## 2021-10-09 LAB — RESP PANEL BY RT-PCR (FLU A&B, COVID) ARPGX2
Influenza A by PCR: NEGATIVE
Influenza B by PCR: NEGATIVE
SARS Coronavirus 2 by RT PCR: NEGATIVE

## 2021-10-09 IMAGING — DX DG CHEST 1V PORT
1 series · 1 of 1 positions shown · non-contrast
Comparison: [DATE]

CLINICAL DATA: Anemia

EXAM:
PORTABLE CHEST 1 VIEW

[chest ap]
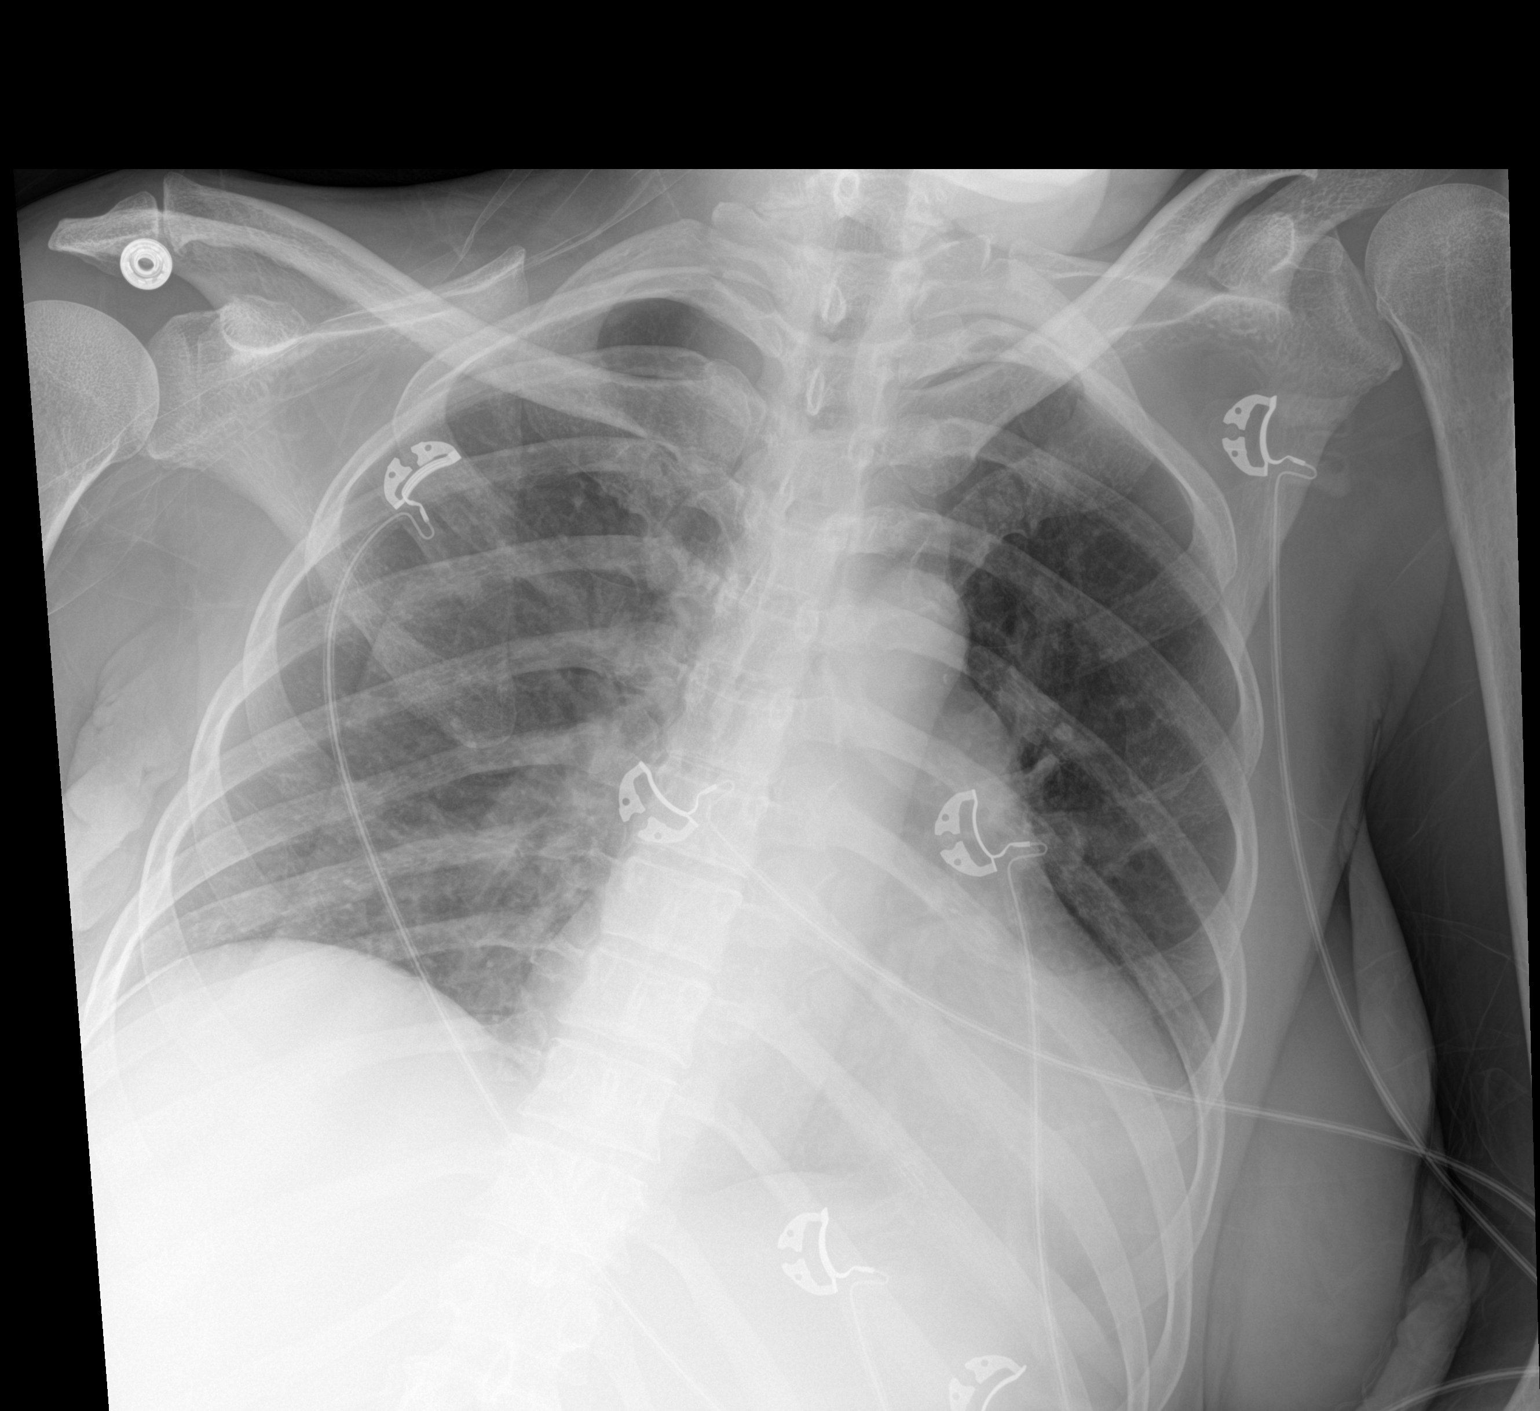

[1 of 1 positions shown; findings below may reference images not displayed]

FINDINGS: Lungs are clear.  No pleural effusion or pneumothorax.

Skin fold along the right lateral hemithorax.

Cardiomegaly.
IMPRESSION: Cardiomegaly.

No evidence of acute cardiopulmonary disease.

## 2021-10-09 MED ORDER — SODIUM CHLORIDE 0.9 % IV SOLN
10.0000 mL/h | Freq: Once | INTRAVENOUS | Status: AC
  Administered 2021-10-10: 10 mL/h via INTRAVENOUS

## 2021-10-09 MED ORDER — ACETAMINOPHEN 650 MG RE SUPP
650.0000 mg | Freq: Once | RECTAL | Status: AC
  Administered 2021-10-09: 650 mg via RECTAL
  Filled 2021-10-09: qty 1

## 2021-10-09 MED ORDER — SODIUM CHLORIDE 0.9 % IV SOLN
2.0000 g | Freq: Once | INTRAVENOUS | Status: AC
  Administered 2021-10-10: 2 g via INTRAVENOUS
  Filled 2021-10-09: qty 2

## 2021-10-09 MED ORDER — VANCOMYCIN HCL IN DEXTROSE 1-5 GM/200ML-% IV SOLN
1000.0000 mg | Freq: Once | INTRAVENOUS | Status: AC
  Administered 2021-10-10: 1000 mg via INTRAVENOUS
  Filled 2021-10-09: qty 200

## 2021-10-09 MED ORDER — LACTATED RINGERS IV SOLN
INTRAVENOUS | Status: AC
  Administered 2021-10-10: 150 mL/h via INTRAVENOUS

## 2021-10-09 MED ORDER — METRONIDAZOLE 500 MG/100ML IV SOLN
500.0000 mg | Freq: Once | INTRAVENOUS | Status: AC
  Administered 2021-10-10: 500 mg via INTRAVENOUS
  Filled 2021-10-09: qty 100

## 2021-10-09 NOTE — Patient Outreach (Signed)
[facility]) Care Management  [id number]  Holly Bradley [birth year] [phone]   Outreach to patient by Dr Leonie Man on 09/26/21 to obtain mRS was successfully completed. MRS= 4  Thank you, [place] Care Management Assistant

## 2021-10-09 NOTE — ED Triage Notes (Signed)
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

## 2021-10-09 NOTE — ED Provider Notes (Signed)
[facility] DEPT Provider Note   CSN: [id number] Arrival date & time: 10/09/21  2123     History Chief Complaint  Patient presents with   abnormal labs    Holly Bradley is a 45 y.o. female who presents from facility with concern for low hemoglobin.  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  Patient also endorses 4 days of nausea and vomiting NBNB emesis, and 5 days of constipation.  She states that she has uterine fibroids and chronically has vaginal bleeding.  Also endorses a large sore on her buttocks.  She states she does not like where she lives and wants to be moved somewhere else upon discharge from this visit.   I personally reads patient's medical records.  She has history of hypertension, uterine fibroids, paroxysmal SVT, cardiomyopathy and heart failure.  She is not anticoagulated.  HPI     Past Medical History:  Diagnosis Date   CHF (congestive heart failure) (HCC)    Chronic blood loss anemia    Related to uterine fibroids   Essential hypertension    Poorly controlled, repeat by report only on nicardipine 90 mg   Fibroid    Likely complicated by by menometrorrhagia and chronic anemia   Hypertension    Paroxysmal SVT (supraventricular tachycardia) (HCC)    Frequent episodes, can last anywhere from 20 minutes to 12-15 hours.  Not on beta-blocker or calcium channel blocker   Uterine fibroid     Patient Active Problem List   Diagnosis Date Noted   Acute blood loss anemia 10/10/2021   Hypertension    Non-ischemic cardiomyopathy (HCC)    Swelling of left hand    Proctocolitis    Constipation    Uterine fibroid    Left hemiparesis (HCC)    Hemorrhagic stroke (HCC)    Abnormal uterine bleeding (AUB)    Staphylococcus epidermidis bacteremia    Iron deficiency anemia due to chronic blood loss 05/25/2021   Cerebral edema (HCC) 05/25/2021    Chronic HFrEF (heart failure with reduced ejection fraction) ([place]) 05/25/2021   Pneumonia due to methicillin susceptible Staphylococcus aureus ([place]) 05/25/2021   Acute respiratory failure with hypoxia (HCC)    Acute right MCA stroke ([place]) 05/15/2021   Uncontrolled hypertension 05/15/2021   Cerebrovascular accident (CVA) ([place]) 05/15/2021   IDA (iron deficiency anemia) 03/01/2021   Healthcare maintenance 03/01/2021   Onychomycosis 03/01/2021   Food insecurity 02/02/2021   Psychosis ([place])    Fibroid 01/11/2021   Menorrhagia 01/11/2021   Acute exacerbation of CHF (congestive heart failure) ([place]) 01/10/2021   AKI (acute kidney injury) ([place])    SVT (supraventricular tachycardia) ([place]) 05/01/2020   CHF (congestive heart failure) ([place]) 05/01/2020    Past Surgical History:  Procedure Laterality Date   IR ANGIOGRAM FOLLOW UP STUDY  05/15/2021   IR CT HEAD LTD  05/15/2021   IR PERCUTANEOUS ART THROMBECTOMY/INFUSION INTRACRANIAL INC DIAG ANGIO  05/15/2021       IR PERCUTANEOUS ART THROMBECTOMY/INFUSION INTRACRANIAL INC DIAG ANGIO  05/15/2021   IR TRANSCATH/EMBOLIZ  05/15/2021   RADIOLOGY WITH ANESTHESIA N/A 05/15/2021   Procedure: IR WITH ANESTHESIA;  Surgeon: Radiologist, Medication, MD;  Location: [facility];  Service: Radiology;  Laterality: N/A;   Unknown       OB History   No obstetric history on file.     Family History  Problem Relation Age of Onset  Dementia Mother     Social History   Tobacco Use   Smoking status: Every Day   Smokeless tobacco: Never  Vaping Use   Vaping Use: Never used  Substance Use Topics   Alcohol use: Not Currently    Comment: Occasionally   Drug use: Not Currently    Types: Marijuana    Home Medications Prior to Admission medications   Medication Sig Start Date End Date Taking? Authorizing Provider  acetaminophen (TYLENOL) 325 MG tablet Place 2 tablets (650 mg total) into feeding tube every 4 (four) hours as needed for mild pain (or temp > 37.5  C (99.5 F)). 07/03/21   Samella Parr, NP  amiodarone (PACERONE) 200 MG tablet Take 200 mg by mouth daily. 02/02/21   [provider]  ARIPiprazole (ABILIFY) 2 MG tablet Take 2 mg by mouth daily.    [provider]  aspirin 81 MG chewable tablet Chew 1 tablet (81 mg total) by mouth daily. 07/04/21   Samella Parr, NP  atorvastatin (LIPITOR) 40 MG tablet Take 1 tablet (40 mg total) by mouth daily. 07/04/21   Samella Parr, NP  baclofen (LIORESAL) 20 MG tablet Take 20 mg by mouth 3 (three) times daily.    [provider]  carvedilol (COREG) 25 MG tablet Take 1 tablet (25 mg total) by mouth 2 (two) times daily with a meal. 07/03/21   Samella Parr, NP  cloNIDine (CATAPRES) 0.1 MG tablet Take 1 tablet (0.1 mg total) by mouth 3 (three) times daily. 07/03/21   Samella Parr, NP  ENTRESTO 97-103 MG Take 1 tablet by mouth 2 (two) times daily. 02/20/21   [provider]  feeding supplement (ENSURE ENLIVE / ENSURE PLUS) LIQD Take 237 mLs by mouth 3 (three) times daily between meals. 07/03/21   Samella Parr, NP  FEROSUL 325 (65 Fe) MG tablet Take 325 mg by mouth 2 (two) times daily. 02/20/21   [provider]  ferrous sulfate 325 (65 FE) MG tablet Take 1 tablet (325 mg total) by mouth 2 (two) times daily with a meal. 02/20/21   Lyda Jester M, PA-C  gabapentin (NEURONTIN) 100 MG capsule Take 100 mg by mouth 3 (three) times daily.    [provider]  hydrALAZINE (APRESOLINE) 100 MG tablet Take 1 tablet (100 mg total) by mouth 3 (three) times daily. 03/02/21   Bensimhon, Shaune Pascal, MD  isosorbide mononitrate (IMDUR) 60 MG 24 hr tablet Take 60 mg by mouth daily. 02/02/21   [provider]  lactulose (CHRONULAC) 10 GM/15ML solution Take 15 mLs (10 g total) by mouth 2 (two) times daily. 07/03/21   Samella Parr, NP  megestrol (MEGACE) 40 MG tablet Take 40 mg by mouth 2 (two) times daily.    [provider]  Multiple Vitamin  (MULTIVITAMIN WITH MINERALS) TABS tablet Take 1 tablet by mouth daily. 07/04/21   Samella Parr, NP  oxyCODONE HCl 7.5 MG TABS Take 7.5 mg by mouth every 4 (four) hours as needed. 07/03/21   Samella Parr, NP  sacubitril-valsartan (ENTRESTO) 97-103 MG Take 1 tablet by mouth 2 (two) times daily. 02/20/21   Lyda Jester M, PA-C  simethicone (MYLICON) 40 FI/4.3PI drops Take 0.6 mLs (40 mg total) by mouth every 6 (six) hours as needed for flatulence. 07/03/21   Samella Parr, NP  torsemide (DEMADEX) 20 MG tablet TAKE 2 TABLETS (40 MG TOTAL) BY MOUTH 2 (TWO) TIMES DAILY. 03/02/21 03/02/22  Bensimhon,  Shaune Pascal, MD  traMADol (ULTRAM) 50 MG tablet Take by mouth every 6 (six) hours as needed.    [provider]    Allergies    Patient has no known allergies.  Review of Systems   Review of Systems  Constitutional:  Positive for appetite change, chills and fatigue. Negative for activity change and fever.  HENT: Negative.    Eyes: Negative.   Respiratory: Negative.    Cardiovascular: Negative.   Gastrointestinal:  Positive for abdominal pain, constipation, nausea and vomiting. Negative for diarrhea.  Genitourinary:  Positive for vaginal bleeding. Negative for dysuria, flank pain, frequency and urgency.  Skin:  Positive for wound.  Neurological: Negative.    Physical Exam Updated Vital Signs BP 134/75 (BP Location: Right Arm)   Pulse 99   Temp 98.3 F (36.8 C) (Oral)   Resp 17   Ht 5\' 5"  (1.651 m)   Wt 68 kg   LMP  (LMP Unknown)   SpO2 90%   BMI 24.96 kg/m   Physical Exam Vitals and nursing note reviewed.  Constitutional:      Appearance: She is ill-appearing. She is not toxic-appearing.  HENT:     Head: Normocephalic and atraumatic.     Nose: Nose normal. No congestion.     Mouth/Throat:     Mouth: Mucous membranes are moist.     Pharynx: Oropharynx is clear. Uvula midline. No oropharyngeal exudate or posterior oropharyngeal erythema.     Tonsils: No tonsillar  exudate.  Eyes:     General: Lids are normal. Vision grossly intact.        Right eye: No discharge.        Left eye: No discharge.     Extraocular Movements: Extraocular movements intact.     Conjunctiva/sclera: Conjunctivae normal.     Pupils: Pupils are equal, round, and reactive to light.  Neck:     Trachea: Trachea and phonation normal.  Cardiovascular:     Rate and Rhythm: Normal rate and regular rhythm.     Pulses: Normal pulses.     Heart sounds: Murmur heard.  Systolic murmur is present with a grade of 3/6.  Pulmonary:     Effort: Pulmonary effort is normal. No tachypnea, bradypnea, accessory muscle usage, prolonged expiration or respiratory distress.     Breath sounds: Normal breath sounds. No wheezing or rales.  Chest:     Chest wall: No mass, lacerations, deformity, swelling, tenderness, crepitus or edema.  Abdominal:     General: Bowel sounds are normal. There is no distension.     Palpations: Abdomen is soft. There is mass.     Tenderness: There is no abdominal tenderness.    Musculoskeletal:        General: No deformity.     Cervical back: Normal range of motion and neck supple. No edema, rigidity or crepitus. No pain with movement, spinous process tenderness or muscular tenderness.     Right lower leg: No edema.     Left lower leg: No edema.  Lymphadenopathy:     Cervical: No cervical adenopathy.  Skin:    General: Skin is warm and dry.     Capillary Refill: Capillary refill takes less than 2 seconds.     Findings: Wound present.       Neurological:     General: No focal deficit present.     Mental Status: She is alert. Mental status is at baseline.     GCS: GCS eye subscore is 4.  GCS verbal subscore is 4. GCS motor subscore is 6.     Cranial Nerves: Cranial nerves are intact.     Sensory: No sensory deficit.     Motor: Weakness and atrophy present.  Psychiatric:        Mood and Affect: Mood normal.    ED Results / Procedures / Treatments    Labs (all labs ordered are listed, but only abnormal results are displayed) Labs Reviewed  COMPREHENSIVE METABOLIC PANEL - Abnormal; Notable for the following components:      Result Value   CO2 20 (*)    Creatinine, Ser 1.14 (*)    Albumin 3.3 (*)    GFR, Estimated 60 (*)    All other components within normal limits  CBC WITH DIFFERENTIAL/PLATELET - Abnormal; Notable for the following components:   RBC 1.42 (*)    Hemoglobin 3.0 (*)    HCT 12.0 (*)    MCH 21.1 (*)    MCHC 25.0 (*)    RDW 19.4 (*)    nRBC 0.3 (*)    All other components within normal limits  PROTIME-INR - Abnormal; Notable for the following components:   Prothrombin Time 20.3 (*)    INR 1.7 (*)    All other components within normal limits  APTT - Abnormal; Notable for the following components:   aPTT 46 (*)    All other components within normal limits  CBC WITH DIFFERENTIAL/PLATELET - Abnormal; Notable for the following components:   RBC 1.38 (*)    Hemoglobin 2.9 (*)    HCT 11.1 (*)    MCH 21.0 (*)    MCHC 26.1 (*)    RDW 19.4 (*)    nRBC 0.3 (*)    All other components within normal limits  RESP PANEL BY RT-PCR (FLU A&B, COVID) ARPGX2  CULTURE, BLOOD (ROUTINE X 2)  CULTURE, BLOOD (ROUTINE X 2)  URINE CULTURE  LACTIC ACID, PLASMA  LACTIC ACID, PLASMA  URINALYSIS, ROUTINE W REFLEX MICROSCOPIC  VITAMIN B12  FOLATE  IRON AND TIBC  FERRITIN  RETICULOCYTES  BLOOD GAS, VENOUS  FIBRINOGEN  DIC (DISSEMINATED INTRAVASCULAR COAGULATION)PANEL  DIC (DISSEMINATED INTRAVASCULAR COAGULATION)PANEL  DIC (DISSEMINATED INTRAVASCULAR COAGULATION)PANEL  DIC (DISSEMINATED INTRAVASCULAR COAGULATION)PANEL  DIC (DISSEMINATED INTRAVASCULAR COAGULATION)PANEL  HEMOGLOBIN AND HEMATOCRIT, BLOOD  HEMOGLOBIN AND HEMATOCRIT, BLOOD  HEMOGLOBIN AND HEMATOCRIT, BLOOD  HEMOGLOBIN AND HEMATOCRIT, BLOOD  HEMOGLOBIN AND HEMATOCRIT, BLOOD  I-STAT BETA HCG BLOOD, ED ([facility], [facility], [facility] ONLY)  TYPE AND SCREEN  PREPARE RBC (CROSSMATCH)     EKG EKG: sinus rhythm, no STEMI.  Radiology DG Chest Port 1 View  Result Date: 10/09/2021 CLINICAL DATA:  Anemia EXAM: PORTABLE CHEST 1 VIEW COMPARISON:  06/02/2021 FINDINGS: Lungs are clear.  No pleural effusion or pneumothorax. Skin fold along the right lateral hemithorax. Cardiomegaly. IMPRESSION: Cardiomegaly. No evidence of acute cardiopulmonary disease. Electronically Signed   By: Julian Hy M.D.   On: 10/09/2021 22:17    Procedures .Critical Care Performed by: Emeline Darling, PA-C Authorized by: Emeline Darling, PA-C   Critical care provider statement:    Critical care time (minutes):  60   Critical care was necessary to treat or prevent imminent or life-threatening deterioration of the following conditions: Critical symptomatic anemia with Hbg of 2.9.   Critical care was time spent personally by me on the following activities:  Development of treatment plan with patient or surrogate, discussions with consultants, evaluation of patient's response to treatment, examination of patient, obtaining history from patient or  surrogate, ordering and performing treatments and interventions, ordering and review of laboratory studies, ordering and review of radiographic studies, pulse oximetry and re-evaluation of patient's condition   Medications Ordered in ED Medications  lactated ringers infusion (has no administration in time range)  ceFEPIme (MAXIPIME) 2 g in sodium chloride 0.9 % 100 mL IVPB (has no administration in time range)  metroNIDAZOLE (FLAGYL) IVPB 500 mg (has no administration in time range)  vancomycin (VANCOCIN) IVPB 1000 mg/200 mL premix (has no administration in time range)  tranexamic acid (CYKLOKAPRON) 1000 MG/10ML topical solution 1,000 mg (has no administration in time range)  megestrol (MEGACE) tablet 40 mg (has no administration in time range)  acetaminophen (TYLENOL) suppository 650 mg (650 mg Rectal Given 10/09/21 2213)  0.9 %  sodium  chloride infusion (10 mL/hr Intravenous New Bag/Given 10/10/21 0020)  0.9 %  sodium chloride infusion (10 mL/hr Intravenous New Bag/Given 10/10/21 0019)    ED Course  I have reviewed the triage vital signs and the nursing notes.  Pertinent labs & imaging results that were available during my care of the patient were reviewed by me and considered in my medical decision making (see chart for details).  Clinical Course as of 10/10/21 0020  Tue Oct 09, 2021  2301 Collateral history obtained from the patient's brother, Alayza Pieper by Bradley.  He states patient's stroke this summer is what caused her to be placed in her rehab facility that she regularly states she does not like living there.  He also can confirm that the patient's abdomen is at her baseline secondary to her fibroids.  States that they were planning to have them removed immediately prior to her stroke; unfortunately because of her CVA this was not performed.  I appreciate his collaboration in the care of this patient. [RS]  2335 Informed by RN that Hbg was 3. STAT repeat CBC, istat chem 8, and type and screen were ordered.  [RS]  Wed Oct 10, 2021  0004 Consult to Dr. Elly Modena, GYN on call, who recommends megace 40 mg BID, pelvic US. She also requests reconsult to OB/GYN in the morning so she can be added to her list for grand rounds. Patient not a candidate for emergent procedure.  I appreciate her collaboration in the care of this patient.  [RS]    Clinical Course User Index [RS] Aneli Zara, Sharlene Dory   MDM Rules/Calculators/A&P                         45 year old female who presents with concern for reportedly low hemoglobin; febrile.  Patient is febrile on intake 102 F.  Child suppository ordered.  Cardiopulmonary exam is normal, abdominal exam with large firm uterus secondary to fibroids.  Decubitus ulcer, stage II on the sacrum.  Neurovascular intact in all 4 extremities and at neurologic baseline following large CVA  in June of this year.  Broad work-up begun given febrile state. CBC without leukocytosis, Hbg critical 3.0 - confirmed by lab multiple times with hbg of 2.9. Will proceed with 2 units of emergency release blood. MTP ordered per attending in consultation with the blood bank.   BMP unremarkable. COVID negative. DG chest negative.  INR elevated to 1.7.   Degrading of fibroids can cause fever, however will cover with broad-spectrum antibiotics given febrile presentation.  Consult to OB/GYN as above, Megace ordered ultrasound ordered.  Consult to hospitalist, Dr. Hal Hope who is agreeable to admit patient to his service.  Appreciate her collaboration in the care of this patient.  Holly Bradley and her brother Holly Bradley voiced understanding of her medical evaluation and treatment plan.  They are both amenable to plan for transfusion at this time.  Patient has required multiple transfusion the past secondary to her bleeding fibroids.  No further work-up warranted in the ER at this time.  Patient admitted to the inpatient service; will continue to be managed by overnight EDPs until removed in the inpatient side.  This chart was dictated using voice recognition software, Dragon. Despite the best efforts of this provider to proofread and correct errors, errors may still occur which can change documentation meaning.   Final Clinical Impression(s) / ED Diagnoses Final diagnoses:  None    Rx / DC Orders ED Discharge Orders     None        Emeline Darling, PA-C 10/10/21 Lawanda Cousins, MD 10/16/21 1049

## 2021-10-09 NOTE — ED Notes (Signed)
IV team contacted per RN request

## 2021-10-10 ENCOUNTER — Encounter (HOSPITAL_COMMUNITY): Payer: Self-pay | Admitting: Internal Medicine

## 2021-10-10 ENCOUNTER — Inpatient Hospital Stay (HOSPITAL_COMMUNITY): Payer: Medicaid - Out of State

## 2021-10-10 ENCOUNTER — Emergency Department (HOSPITAL_COMMUNITY): Payer: Medicaid - Out of State

## 2021-10-10 DIAGNOSIS — D252 Subserosal leiomyoma of uterus: Secondary | ICD-10-CM | POA: Diagnosis present

## 2021-10-10 DIAGNOSIS — D649 Anemia, unspecified: Secondary | ICD-10-CM | POA: Diagnosis present

## 2021-10-10 DIAGNOSIS — G8194 Hemiplegia, unspecified affecting left nondominant side: Secondary | ICD-10-CM

## 2021-10-10 DIAGNOSIS — I82422 Acute embolism and thrombosis of left iliac vein: Secondary | ICD-10-CM | POA: Diagnosis present

## 2021-10-10 DIAGNOSIS — I82409 Acute embolism and thrombosis of unspecified deep veins of unspecified lower extremity: Secondary | ICD-10-CM | POA: Diagnosis not present

## 2021-10-10 DIAGNOSIS — N920 Excessive and frequent menstruation with regular cycle: Secondary | ICD-10-CM | POA: Diagnosis present

## 2021-10-10 DIAGNOSIS — D6862 Lupus anticoagulant syndrome: Secondary | ICD-10-CM | POA: Diagnosis present

## 2021-10-10 DIAGNOSIS — D259 Leiomyoma of uterus, unspecified: Secondary | ICD-10-CM | POA: Diagnosis not present

## 2021-10-10 DIAGNOSIS — I5032 Chronic diastolic (congestive) heart failure: Secondary | ICD-10-CM | POA: Diagnosis present

## 2021-10-10 DIAGNOSIS — K5900 Constipation, unspecified: Secondary | ICD-10-CM | POA: Diagnosis present

## 2021-10-10 DIAGNOSIS — L89322 Pressure ulcer of left buttock, stage 2: Secondary | ICD-10-CM | POA: Diagnosis present

## 2021-10-10 DIAGNOSIS — I82412 Acute embolism and thrombosis of left femoral vein: Secondary | ICD-10-CM | POA: Diagnosis present

## 2021-10-10 DIAGNOSIS — F17200 Nicotine dependence, unspecified, uncomplicated: Secondary | ICD-10-CM | POA: Diagnosis present

## 2021-10-10 DIAGNOSIS — N921 Excessive and frequent menstruation with irregular cycle: Secondary | ICD-10-CM | POA: Diagnosis not present

## 2021-10-10 DIAGNOSIS — D251 Intramural leiomyoma of uterus: Secondary | ICD-10-CM | POA: Diagnosis present

## 2021-10-10 DIAGNOSIS — I471 Supraventricular tachycardia: Secondary | ICD-10-CM | POA: Diagnosis present

## 2021-10-10 DIAGNOSIS — Z7189 Other specified counseling: Secondary | ICD-10-CM | POA: Diagnosis not present

## 2021-10-10 DIAGNOSIS — H53462 Homonymous bilateral field defects, left side: Secondary | ICD-10-CM | POA: Diagnosis present

## 2021-10-10 DIAGNOSIS — R760 Raised antibody titer: Secondary | ICD-10-CM | POA: Diagnosis not present

## 2021-10-10 DIAGNOSIS — I619 Nontraumatic intracerebral hemorrhage, unspecified: Secondary | ICD-10-CM | POA: Diagnosis not present

## 2021-10-10 DIAGNOSIS — L89626 Pressure-induced deep tissue damage of left heel: Secondary | ICD-10-CM | POA: Diagnosis present

## 2021-10-10 DIAGNOSIS — D62 Acute posthemorrhagic anemia: Secondary | ICD-10-CM | POA: Diagnosis present

## 2021-10-10 DIAGNOSIS — I871 Compression of vein: Secondary | ICD-10-CM | POA: Diagnosis present

## 2021-10-10 DIAGNOSIS — R32 Unspecified urinary incontinence: Secondary | ICD-10-CM | POA: Diagnosis present

## 2021-10-10 DIAGNOSIS — Z5320 Procedure and treatment not carried out because of patient's decision for unspecified reasons: Secondary | ICD-10-CM | POA: Diagnosis not present

## 2021-10-10 DIAGNOSIS — I4891 Unspecified atrial fibrillation: Secondary | ICD-10-CM | POA: Diagnosis present

## 2021-10-10 DIAGNOSIS — I110 Hypertensive heart disease with heart failure: Secondary | ICD-10-CM | POA: Diagnosis present

## 2021-10-10 DIAGNOSIS — Z993 Dependence on wheelchair: Secondary | ICD-10-CM | POA: Diagnosis not present

## 2021-10-10 DIAGNOSIS — I429 Cardiomyopathy, unspecified: Secondary | ICD-10-CM | POA: Diagnosis present

## 2021-10-10 DIAGNOSIS — R140 Abdominal distension (gaseous): Secondary | ICD-10-CM | POA: Diagnosis not present

## 2021-10-10 DIAGNOSIS — Z20822 Contact with and (suspected) exposure to covid-19: Secondary | ICD-10-CM | POA: Diagnosis present

## 2021-10-10 DIAGNOSIS — I10 Essential (primary) hypertension: Secondary | ICD-10-CM | POA: Diagnosis not present

## 2021-10-10 DIAGNOSIS — I69354 Hemiplegia and hemiparesis following cerebral infarction affecting left non-dominant side: Secondary | ICD-10-CM | POA: Diagnosis not present

## 2021-10-10 DIAGNOSIS — R609 Edema, unspecified: Secondary | ICD-10-CM | POA: Diagnosis not present

## 2021-10-10 DIAGNOSIS — M7989 Other specified soft tissue disorders: Secondary | ICD-10-CM | POA: Diagnosis not present

## 2021-10-10 DIAGNOSIS — I69322 Dysarthria following cerebral infarction: Secondary | ICD-10-CM | POA: Diagnosis not present

## 2021-10-10 LAB — RETICULOCYTES
Immature Retic Fract: 26.8 % — ABNORMAL HIGH (ref 2.3–15.9)
RBC.: 1.94 MIL/uL — ABNORMAL LOW (ref 3.87–5.11)
Retic Count, Absolute: 108.8 K/uL (ref 19.0–186.0)
Retic Ct Pct: 5.6 % — ABNORMAL HIGH (ref 0.4–3.1)

## 2021-10-10 LAB — DIC (DISSEMINATED INTRAVASCULAR COAGULATION)PANEL
D-Dimer, Quant: 7.55 ug{FEU}/mL — ABNORMAL HIGH (ref 0.00–0.50)
Fibrinogen: 359 mg/dL (ref 210–475)
INR: 1.7 — ABNORMAL HIGH (ref 0.8–1.2)
Platelets: 331 K/uL (ref 150–400)
Prothrombin Time: 20.3 s — ABNORMAL HIGH (ref 11.4–15.2)
Smear Review: NONE SEEN
aPTT: 46 s — ABNORMAL HIGH (ref 24–36)

## 2021-10-10 LAB — VITAMIN B12: Vitamin B-12: 1206 pg/mL — ABNORMAL HIGH (ref 180–914)

## 2021-10-10 LAB — FERRITIN: Ferritin: 19 ng/mL (ref 11–307)

## 2021-10-10 LAB — PROTIME-INR
INR: 1.3 — ABNORMAL HIGH (ref 0.8–1.2)
Prothrombin Time: 15.8 seconds — ABNORMAL HIGH (ref 11.4–15.2)

## 2021-10-10 LAB — URINALYSIS, ROUTINE W REFLEX MICROSCOPIC
Bacteria, UA: NONE SEEN
Bilirubin Urine: NEGATIVE
Glucose, UA: NEGATIVE mg/dL
Ketones, ur: NEGATIVE mg/dL
Leukocytes,Ua: NEGATIVE
Nitrite: NEGATIVE
Protein, ur: 30 mg/dL — AB
RBC / HPF: >50 RBC/hpf — ABNORMAL HIGH (ref 0–5)
Specific Gravity, Urine: 1.014 (ref 1.005–1.030)
pH: 6 (ref 5.0–8.0)

## 2021-10-10 LAB — BLOOD GAS, VENOUS
Acid-base deficit: 0.3 mmol/L (ref 0.0–2.0)
Bicarbonate: 23 mmol/L (ref 20.0–28.0)
O2 Saturation: 100 %
Patient temperature: 98.6
pCO2, Ven: 31.5 mmHg — ABNORMAL LOW (ref 44.0–60.0)
pH, Ven: 7.477 — ABNORMAL HIGH (ref 7.250–7.430)
pO2, Ven: 168 mmHg — ABNORMAL HIGH (ref 32.0–45.0)

## 2021-10-10 LAB — CBC
HCT: 35.1 % — ABNORMAL LOW (ref 36.0–46.0)
Hemoglobin: 11.1 g/dL — ABNORMAL LOW (ref 12.0–15.0)
MCH: 27.6 pg (ref 26.0–34.0)
MCHC: 31.6 g/dL (ref 30.0–36.0)
MCV: 87.3 fL (ref 80.0–100.0)
Platelets: 332 10*3/uL (ref 150–400)
RBC: 4.02 MIL/uL (ref 3.87–5.11)
RDW: 17.2 % — ABNORMAL HIGH (ref 11.5–15.5)
WBC: 9 10*3/uL (ref 4.0–10.5)
nRBC: 0.8 % — ABNORMAL HIGH (ref 0.0–0.2)

## 2021-10-10 LAB — HEMOGLOBIN AND HEMATOCRIT, BLOOD
HCT: 11.7 % — ABNORMAL LOW (ref 36.0–46.0)
Hemoglobin: 3.1 g/dL — CL (ref 12.0–15.0)

## 2021-10-10 LAB — IRON AND TIBC
Iron: 8 ug/dL — ABNORMAL LOW (ref 28–170)
Saturation Ratios: 3 % — ABNORMAL LOW (ref 10.4–31.8)
TIBC: 302 ug/dL (ref 250–450)
UIBC: 294 ug/dL

## 2021-10-10 LAB — MRSA NEXT GEN BY PCR, NASAL: MRSA by PCR Next Gen: NOT DETECTED

## 2021-10-10 LAB — FIBRINOGEN: Fibrinogen: 364 mg/dL (ref 210–475)

## 2021-10-10 LAB — GLUCOSE, CAPILLARY: Glucose-Capillary: 113 mg/dL — ABNORMAL HIGH (ref 70–99)

## 2021-10-10 LAB — FOLATE: Folate: 11.3 ng/mL (ref >5.9)

## 2021-10-10 LAB — APTT: aPTT: 35 seconds (ref 24–36)

## 2021-10-10 IMAGING — US US PELVIS COMPLETE
1 series · 15 of 25 positions shown · non-contrast
Comparison: CT abdomen/pelvis dated [DATE]

CLINICAL DATA: Pelvic pain, bleeding, uterine fibroids

EXAM:
TRANSABDOMINAL ULTRASOUND OF PELVIS
TECHNIQUE: Transabdominal ultrasound examination of the pelvis was performed
including evaluation of the uterus, ovaries, adnexal regions, and
pelvic cul-de-sac.

[Series 1: us pelvis complete [facility] & [facility] · 15 of 61 slices shown]
[im 1/61]
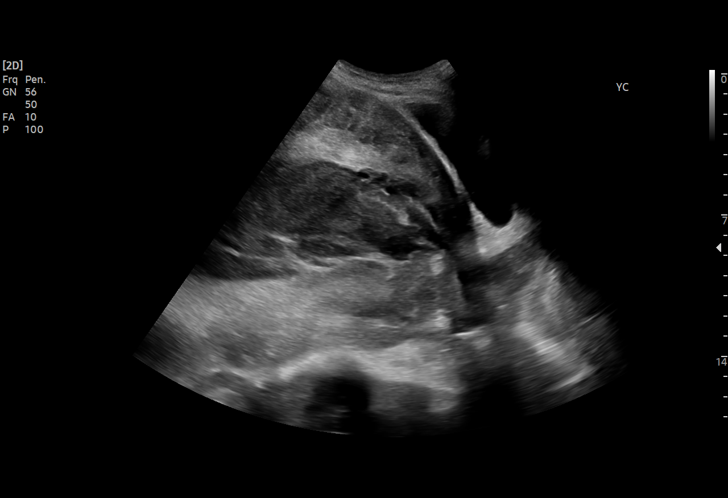
[im 6/61]
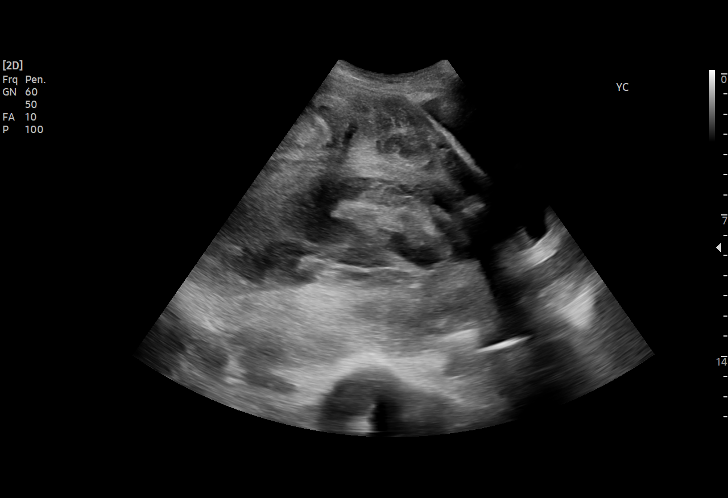
[im 11/61]
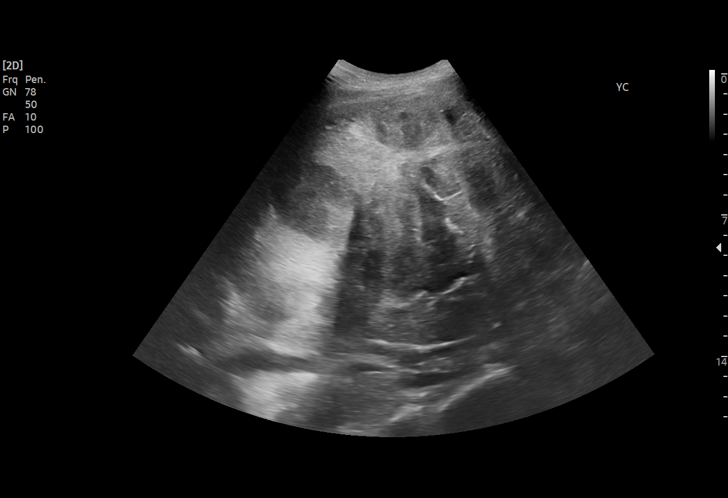
[im 13/61]
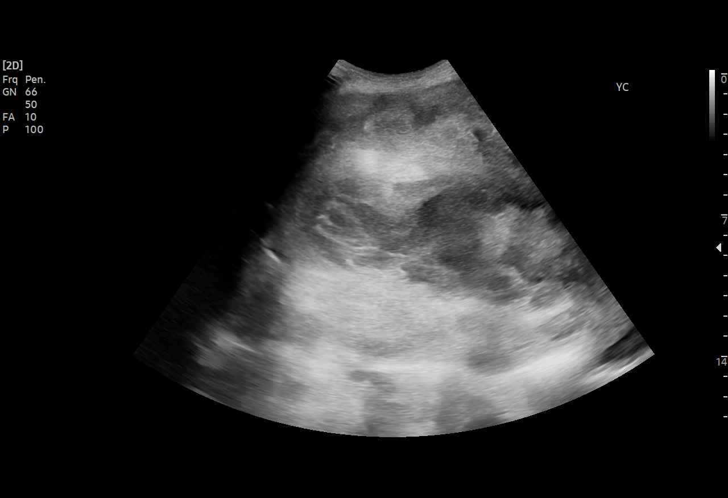
[im 18/61]
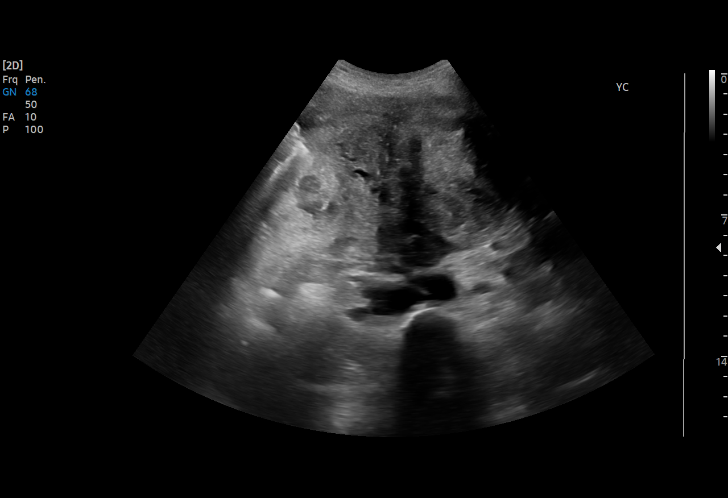
[im 23/61]
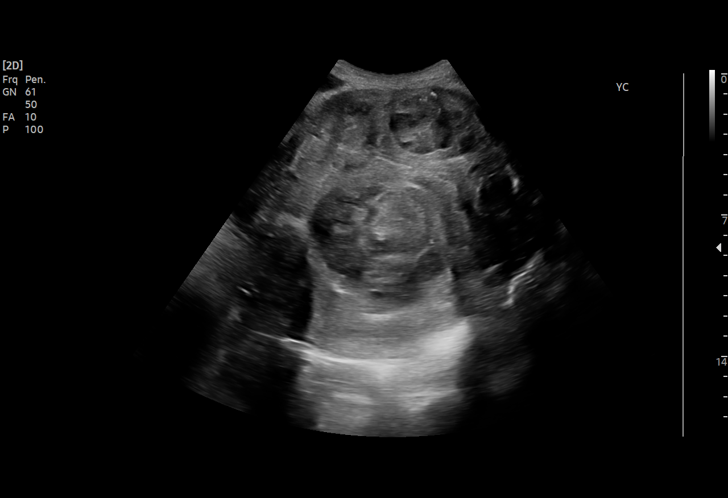
[im 26/61]
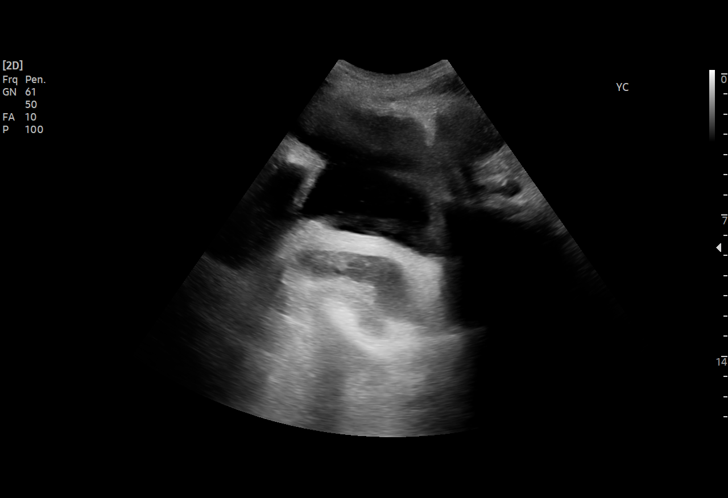
[im 31/61]
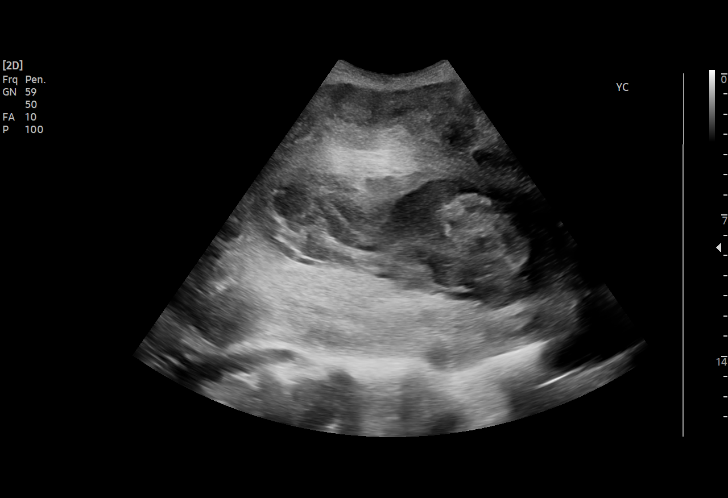
[im 36/61]
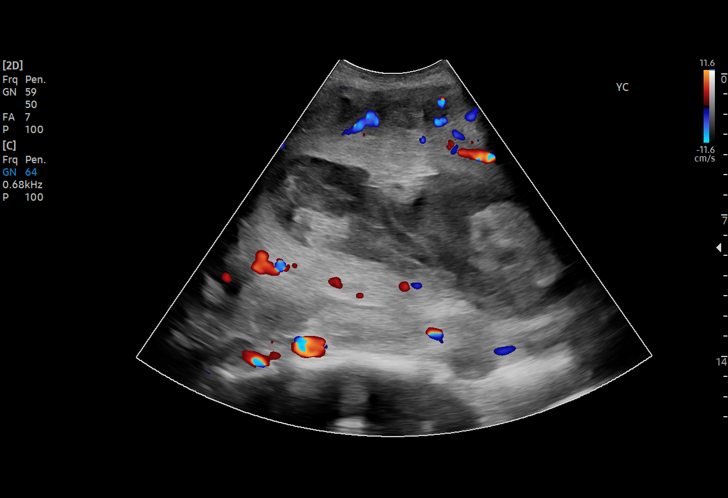
[im 38/61]
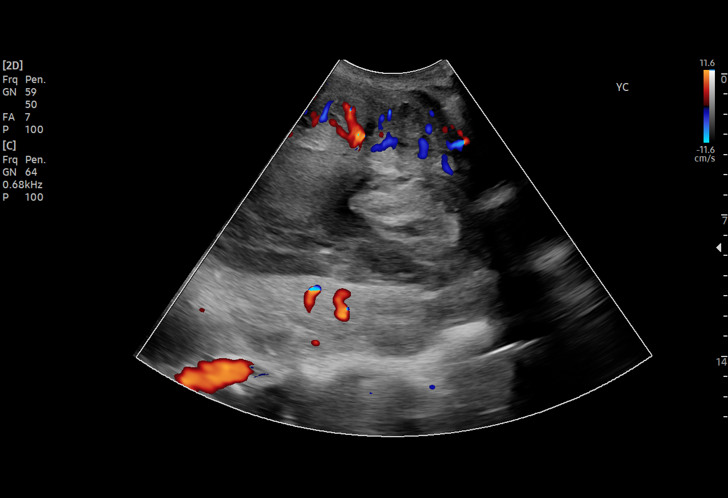
[im 43/61]
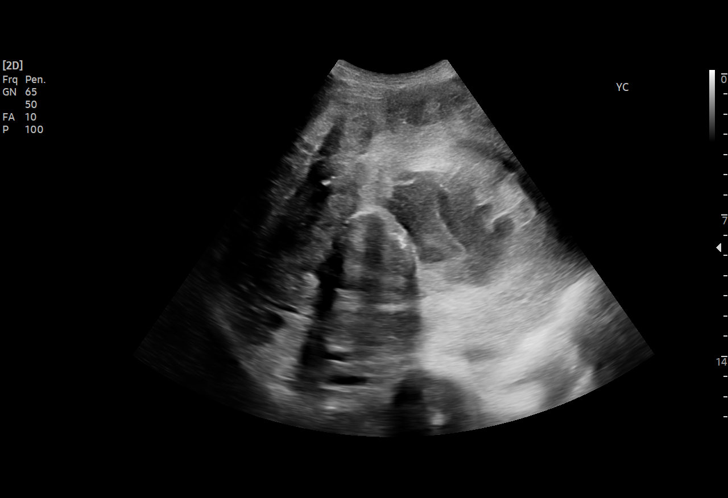
[im 48/61]
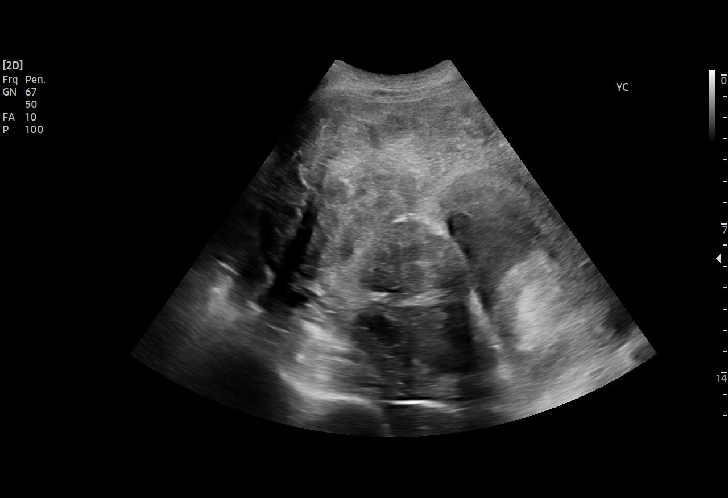
[im 51/61]
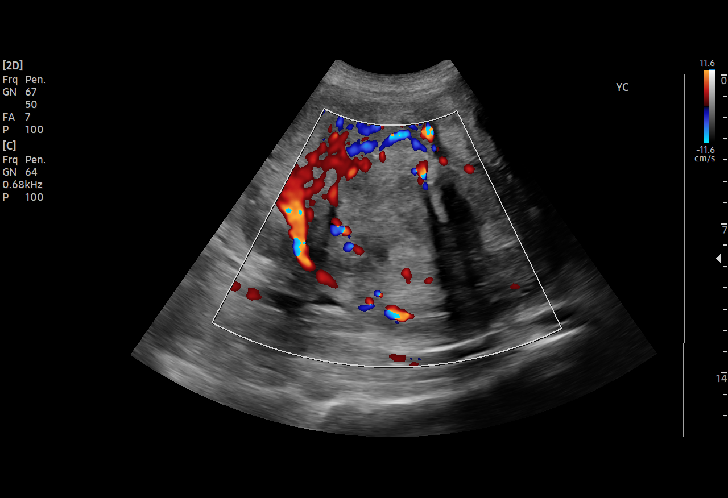
[im 56/61]
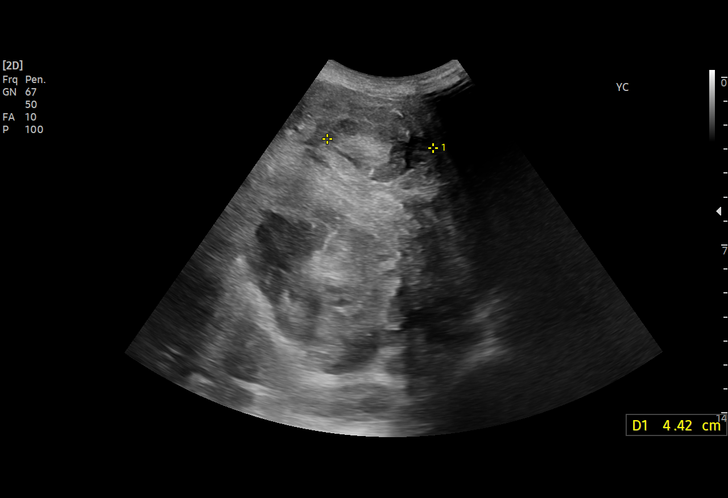
[im 61/61]
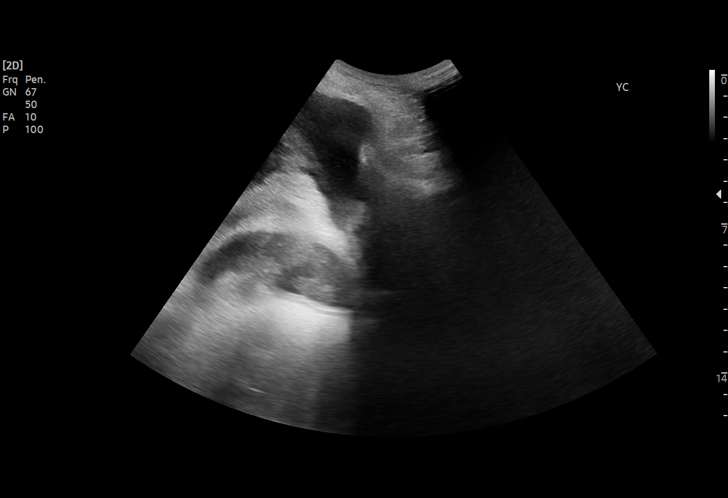

[15 of 25 positions shown; findings below may reference images not displayed]

FINDINGS: Uterus

Measurements: 24.7 x 13.7 x 22.5 cm = volume: [0J] mL. Multiple
uterine fibroids, including:

--6.5 x 4.9 x 5.2 cm intramural fibroid in the right posterior
uterine body

--6.2 x 6.3 x 5.1 cm intramural fibroid in the right posterior
uterine fundus

--4.1 x 3.1 x 4.4 cm subserosal fibroid in the left anterior uterine
body

Endometrium

Thickness: 27 mm. Heterogeneous thickening with complex soft
tissue/fluid in the endometrial cavity.

Right ovary

Not discretely visualized.  No adnexal mass is seen.

Left ovary

Not discretely visualized.  No adnexal mass is seen.

Other findings:  No abnormal free fluid.
IMPRESSION: Enlarged uterus with multiple uterine fibroids, measuring up to
cm, as above.

Heterogeneous, thickened endometrium, measuring 2.7 cm. Endometrial
sampling is suggested.

## 2021-10-10 IMAGING — DX DG ABDOMEN 1V
1 series · 1 of 1 positions shown · non-contrast
Comparison: None.

CLINICAL DATA: Abdominal distension

EXAM:
ABDOMEN - 1 VIEW

[abdomen kub]
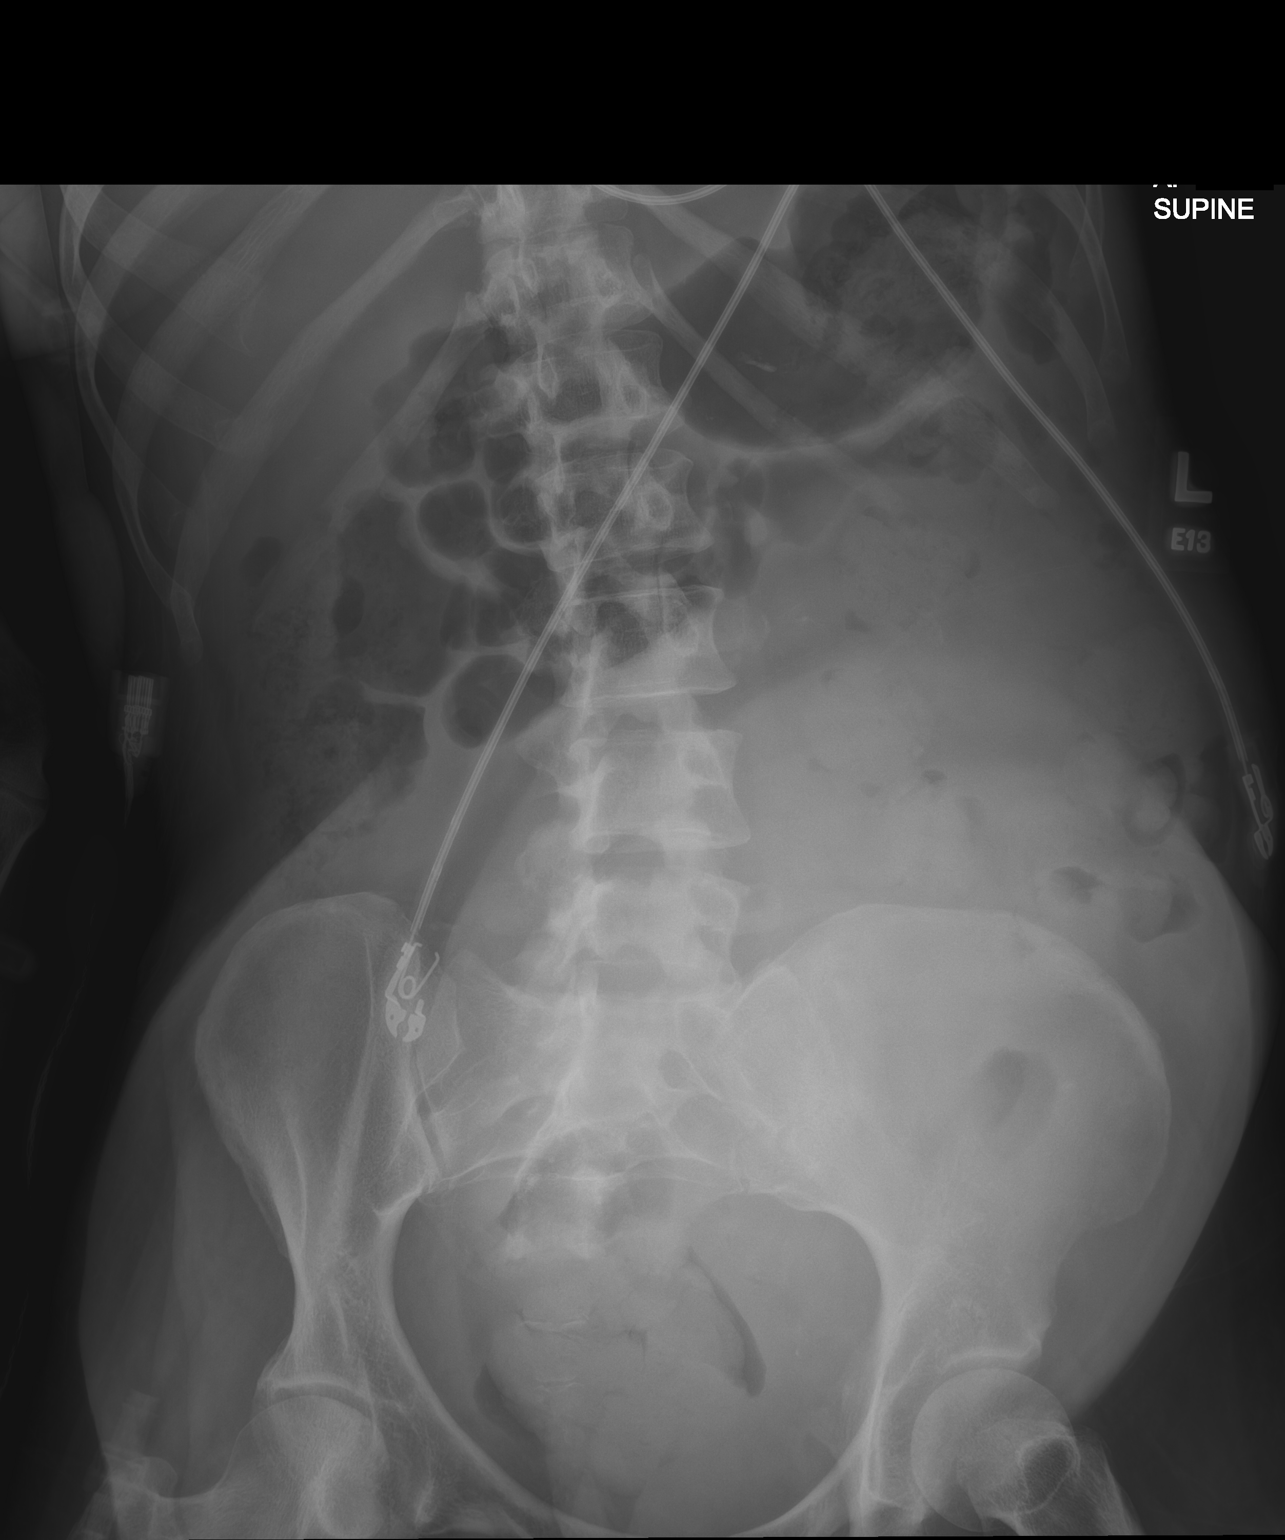

[1 of 1 positions shown; findings below may reference images not displayed]

FINDINGS: There is no evidence of bowel obstruction. Mass effect from known
pelvic mass. There are no radiopaque calculi overlying the kidneys
or course of the ureters. No acute osseous abnormality.
IMPRESSION: No evidence of bowel obstruction.

## 2021-10-10 MED ORDER — VANCOMYCIN HCL 1250 MG/250ML IV SOLN
1250.0000 mg | INTRAVENOUS | Status: DC
  Administered 2021-10-10 – 2021-10-11 (×2): 1250 mg via INTRAVENOUS
  Filled 2021-10-10 (×3): qty 250

## 2021-10-10 MED ORDER — SODIUM CHLORIDE 0.9% IV SOLUTION: Freq: Once | INTRAVENOUS | Status: AC

## 2021-10-10 MED ORDER — GABAPENTIN 100 MG PO CAPS: 100.0000 mg | ORAL_CAPSULE | Freq: Three times a day (TID) | ORAL | Status: DC

## 2021-10-10 MED ORDER — SODIUM CHLORIDE 0.9 % IV SOLN
2.0000 g | Freq: Two times a day (BID) | INTRAVENOUS | Status: DC
  Administered 2021-10-10 (×2): 2 g via INTRAVENOUS
  Filled 2021-10-10 (×2): qty 2

## 2021-10-10 MED ORDER — CHLORHEXIDINE GLUCONATE CLOTH 2 % EX PADS
6.0000 | MEDICATED_PAD | Freq: Every day | CUTANEOUS | Status: DC
  Administered 2021-10-10 – 2021-10-26 (×15): 6 via TOPICAL

## 2021-10-10 MED ORDER — SACUBITRIL-VALSARTAN 97-103 MG PO TABS
1.0000 | ORAL_TABLET | Freq: Two times a day (BID) | ORAL | Status: DC
  Administered 2021-10-10 – 2021-10-23 (×28): 1 via ORAL
  Filled 2021-10-10 (×30): qty 1

## 2021-10-10 MED ORDER — HYDRALAZINE HCL 20 MG/ML IJ SOLN
10.0000 mg | INTRAMUSCULAR | Status: DC | PRN
  Administered 2021-10-10 – 2021-10-26 (×13): 10 mg via INTRAVENOUS
  Filled 2021-10-10 (×13): qty 1

## 2021-10-10 MED ORDER — ARIPIPRAZOLE 2 MG PO TABS: 2.0000 mg | ORAL_TABLET | Freq: Every day | ORAL | Status: DC

## 2021-10-10 MED ORDER — ENSURE ENLIVE PO LIQD
237.0000 mL | Freq: Three times a day (TID) | ORAL | Status: DC
  Administered 2021-10-10 – 2021-10-27 (×34): 237 mL via ORAL
  Filled 2021-10-10: qty 237

## 2021-10-10 MED ORDER — MEGESTROL ACETATE 40 MG PO TABS
40.0000 mg | ORAL_TABLET | Freq: Two times a day (BID) | ORAL | Status: AC
  Administered 2021-10-10 – 2021-10-16 (×14): 40 mg via ORAL
  Filled 2021-10-10 (×17): qty 1

## 2021-10-10 MED ORDER — ISOSORBIDE MONONITRATE ER 60 MG PO TB24
60.0000 mg | ORAL_TABLET | Freq: Every day | ORAL | Status: DC
  Administered 2021-10-10 – 2021-10-27 (×18): 60 mg via ORAL
  Filled 2021-10-10 (×18): qty 1

## 2021-10-10 MED ORDER — ATORVASTATIN CALCIUM 40 MG PO TABS
40.0000 mg | ORAL_TABLET | Freq: Every day | ORAL | Status: DC
  Administered 2021-10-10 – 2021-10-27 (×18): 40 mg via ORAL
  Filled 2021-10-10 (×18): qty 1

## 2021-10-10 MED ORDER — BACLOFEN 10 MG PO TABS
20.0000 mg | ORAL_TABLET | Freq: Three times a day (TID) | ORAL | Status: DC
  Administered 2021-10-10 – 2021-10-27 (×52): 20 mg via ORAL
  Filled 2021-10-10 (×52): qty 2

## 2021-10-10 MED ORDER — CLONIDINE HCL 0.1 MG PO TABS
0.1000 mg | ORAL_TABLET | Freq: Three times a day (TID) | ORAL | Status: DC
  Administered 2021-10-10 – 2021-10-27 (×51): 0.1 mg via ORAL
  Filled 2021-10-10 (×52): qty 1

## 2021-10-10 MED ORDER — TRANEXAMIC ACID FOR EPISTAXIS
1000.0000 mg | Freq: Once | TOPICAL | Status: DC
  Filled 2021-10-10 (×2): qty 10

## 2021-10-10 MED ORDER — NYSTATIN 100000 UNIT/ML MT SUSP
5.0000 mL | Freq: Four times a day (QID) | OROMUCOSAL | Status: DC
  Administered 2021-10-10 – 2021-10-22 (×46): 500000 [IU] via ORAL
  Filled 2021-10-10 (×39): qty 5

## 2021-10-10 MED ORDER — ACETAMINOPHEN 325 MG PO TABS
650.0000 mg | ORAL_TABLET | Freq: Four times a day (QID) | ORAL | Status: DC | PRN
  Administered 2021-10-10 – 2021-10-14 (×7): 650 mg via ORAL
  Filled 2021-10-10 (×7): qty 2

## 2021-10-10 MED ORDER — ORAL CARE MOUTH RINSE
15.0000 mL | Freq: Two times a day (BID) | OROMUCOSAL | Status: DC
  Administered 2021-10-10 – 2021-10-26 (×32): 15 mL via OROMUCOSAL

## 2021-10-10 MED ORDER — HYDRALAZINE HCL 20 MG/ML IJ SOLN
10.0000 mg | Freq: Once | INTRAMUSCULAR | Status: AC
  Administered 2021-10-10: 10 mg via INTRAVENOUS
  Filled 2021-10-10: qty 1

## 2021-10-10 MED ORDER — AMIODARONE HCL 200 MG PO TABS
200.0000 mg | ORAL_TABLET | Freq: Every day | ORAL | Status: DC
  Administered 2021-10-10 – 2021-10-27 (×18): 200 mg via ORAL
  Filled 2021-10-10 (×18): qty 1

## 2021-10-10 MED ORDER — ACETAMINOPHEN 650 MG RE SUPP: 650.0000 mg | Freq: Four times a day (QID) | RECTAL | Status: DC | PRN

## 2021-10-10 MED ORDER — TRAMADOL HCL 50 MG PO TABS
50.0000 mg | ORAL_TABLET | Freq: Four times a day (QID) | ORAL | Status: DC | PRN
  Administered 2021-10-10 – 2021-10-14 (×6): 50 mg via ORAL
  Filled 2021-10-10 (×7): qty 1

## 2021-10-10 MED ORDER — CARVEDILOL 12.5 MG PO TABS
25.0000 mg | ORAL_TABLET | Freq: Two times a day (BID) | ORAL | Status: DC
  Administered 2021-10-10 – 2021-10-11 (×3): 25 mg via ORAL
  Filled 2021-10-10 (×3): qty 2

## 2021-10-10 MED ORDER — HYDRALAZINE HCL 50 MG PO TABS
100.0000 mg | ORAL_TABLET | Freq: Three times a day (TID) | ORAL | Status: DC
  Administered 2021-10-10 – 2021-10-23 (×41): 100 mg via ORAL
  Filled 2021-10-10 (×43): qty 2

## 2021-10-10 NOTE — Plan of Care (Signed)

## 2021-10-10 NOTE — Consult Note (Signed)
OBSTETRICS AND GYNECOLOGY ATTENDING CONSULT NOTE  Consult Date: 10/10/2021 Reason for Consult: menorrhagia with anemia Consulting Provider: Dr. Wyline Copas    Assessment/Plan: Menorrhagia with anemia - Transfusion with improvement in HgB noted. She also is s/p TXA and was started on Megace - The bleeding is now stabilized on the Megace. I would recommend continuing this indefinitely unless another options is decided upon by POA - Most likely change in heaviness is due to the addition of Eliquis due to her h/o stroke. She had a history of anemia but this acute worsening is likely due to this.  - Alternatives to Megace would be Mirena IUD or [place]. Limits to the [place] would be the size of the fibroids which the main risks would be the risks associated with fibroid degeneration and also it may not be effective, however if other options exhausted or she bleeds heavily again, this would be worth considering. It also is a good primary option. We also discussed Barbette Merino and the way it works.  - At this time, I wouldn't advise major surgery consisting of a hysterectomy as she would be at very high risk from the procedure with main risk being that of death from the surgery or anesthesia.  - Megace could be continued as a bridge to these other options but again could be continued indefinitely as well.  - After speaking with [name], her POA, he thinks the [place] might be best. He would like to talk to [facility] tomorrow. If they decide to go this route, he will let the team know and IR can be consulted to discuss more in depth with them.   Fibroids - From review of prior imaging, these fibroids are primarily stable in size.  - Options are as listed above - Mirena, [place] - Second opinion could be obtained by Dr. Valarie Cones with gyn/onc.    Appreciate care of Amberlyn Martinezgarcia by her primary team  - If additional questions please do not hesitate to call. Otherwise we will sign off. Discussed with [name] that if he has more  questions, we are happy to call him directly.   Please call [phone] [facility] OB/GYN Consult Attending Monday-Friday 8am - 5pm) or [phone] [facility] OB/GYN Attending On Call all day, every day) for any gynecologic concerns at any time.  Thank you for involving [place] in the care of this patient.  Total consultation time including face-to-face time with patient (>50% of time), reviewing chart and documentation: 98 minutes  Radene Gunning MD, [place], [facility] for [facility], [place] Phone: [phone]     History of Present Illness: Holly Bradley is an 45 y.o.  G75 female who was admitted for  Anemia in the s/o vaginal bleeding. She has had a known history of fibroids that have been stable in size from at least May.  However since her last CBC (baseline HgB 8-9), she had a stroke and was started on Eliquis which likely caused her bleeding to worsen leading to this progressively worse HgB of 3.0.   Prior to her stroke, she had heavy and irregular periods. She saw Dr. Elgie Congo and he also recommended against surgical intervention I.e. hysterectomy due to her comorbidities. Since her saw her, she has now also had a stroke. Since the stroke and starting Eliquis, she has bled pretty much continuously and was not on the megace.   Since arrival, she was given TXA and been transfused and her last HgB was 11.1.  She is not sexually active.   Pertinent OB/GYN History: No LMP recorded (lmp unknown). OB History  No obstetric history on file.    Patient Active Problem List   Diagnosis Date Noted   Acute blood loss anemia 10/10/2021   Hypertension    Non-ischemic cardiomyopathy (HCC)    Swelling of left hand    Proctocolitis    Constipation    Uterine fibroid    Left hemiparesis (HCC)    Hemorrhagic stroke (HCC)    Abnormal uterine bleeding (AUB)    Staphylococcus epidermidis bacteremia    Iron deficiency anemia due to  chronic blood loss 05/25/2021   Cerebral edema (HCC) 05/25/2021   Chronic HFrEF (heart failure with reduced ejection fraction) ([place]) 05/25/2021   Pneumonia due to methicillin susceptible Staphylococcus aureus ([place]) 05/25/2021   Acute respiratory failure with hypoxia (HCC)    Acute right MCA stroke ([place]) 05/15/2021   Uncontrolled hypertension 05/15/2021   Cerebrovascular accident (CVA) ([place]) 05/15/2021   IDA (iron deficiency anemia) 03/01/2021   Healthcare maintenance 03/01/2021   Onychomycosis 03/01/2021   Food insecurity 02/02/2021   Psychosis ([place])    Fibroid 01/11/2021   Menorrhagia 01/11/2021   Acute exacerbation of CHF (congestive heart failure) ([place]) 01/10/2021   AKI (acute kidney injury) ([place])    SVT (supraventricular tachycardia) ([place]) 05/01/2020   CHF (congestive heart failure) ([place]) 05/01/2020    Past Medical History:  Diagnosis Date   CHF (congestive heart failure) (HCC)    Chronic blood loss anemia    Related to uterine fibroids   Essential hypertension    Poorly controlled, repeat by report only on nicardipine 90 mg   Fibroid    Likely complicated by by menometrorrhagia and chronic anemia   Hypertension    Paroxysmal SVT (supraventricular tachycardia) (HCC)    Frequent episodes, can last anywhere from 20 minutes to 12-15 hours.  Not on beta-blocker or calcium channel blocker   Uterine fibroid     Past Surgical History:  Procedure Laterality Date   IR ANGIOGRAM FOLLOW UP STUDY  05/15/2021   IR CT HEAD LTD  05/15/2021   IR PERCUTANEOUS ART THROMBECTOMY/INFUSION INTRACRANIAL INC DIAG ANGIO  05/15/2021       IR PERCUTANEOUS ART THROMBECTOMY/INFUSION INTRACRANIAL INC DIAG ANGIO  05/15/2021   IR TRANSCATH/EMBOLIZ  05/15/2021   RADIOLOGY WITH ANESTHESIA N/A 05/15/2021   Procedure: IR WITH ANESTHESIA;  Surgeon: Radiologist, Medication, MD;  Location: [place];  Service: Radiology;  Laterality: N/A;   Unknown      Family History  Problem Relation Age of Onset   Dementia  Mother     Social History:  reports that she has been smoking. She has never used smokeless tobacco. She reports that she does not currently use alcohol. She reports that she does not currently use drugs after having used the following drugs: Marijuana.  Allergies: No Known Allergies  Medications: I have reviewed the patient's current medications.  Review of Systems: Pertinent items are noted in HPI.  Focused Physical Examination: BP (!) 152/72   Pulse 85   Temp 97.8 F (36.6 C) (Oral)   Resp 14   Ht 5\' 5"  (1.651 m)   Wt 57.5 kg   LMP  (LMP Unknown)   SpO2 100%   BMI 21.09 kg/m  CONSTITUTIONAL:  no acute distress.  NEUROLOGIC: significant motor deficits noted, minimal mobility of left side, slow response to questions but appropriate PSYCHIATRIC: Normal mood and affect. Normal behavior.  CARDIOVASCULAR: Normal heart rate noted, regular rhythm  RESPIRATORY: Effort and breath sounds normal, no problems with respiration noted. ABDOMEN: Soft, normal bowel sounds, No tenderness, rebound or guarding. Fundus is above her umbilicus  PELVIC: Normal appearing external genitalia; small amount of blood on the pad, quarter size clot noted at introitus Done in the presence of a chaperone.   Labs and Imaging: Results for orders placed or performed during the hospital encounter of 10/09/21 (from the past 72 hour(s))  Resp Panel by RT-PCR (Flu A&B, Covid) Nasopharyngeal Swab     Status: None   Collection Time: 10/09/21  9:52 PM   Specimen: Nasopharyngeal Swab; Nasopharyngeal(NP) swabs in vial transport medium  Result Value Ref Range   SARS Coronavirus 2 by RT PCR NEGATIVE NEGATIVE    Comment: (NOTE) SARS-CoV-2 target nucleic acids are NOT DETECTED.  The SARS-CoV-2 RNA is generally detectable in upper respiratory specimens during the acute phase of infection. The lowest concentration of SARS-CoV-2 viral copies this assay can detect is 138 copies/mL. A negative result does not preclude  SARS-Cov-2 infection and should not be used as the sole basis for treatment or other patient management decisions. A negative result may occur with  improper specimen collection/handling, submission of specimen other than nasopharyngeal swab, presence of viral mutation(s) within the areas targeted by this assay, and inadequate number of viral copies(<138 copies/mL). A negative result must be combined with clinical observations, patient history, and epidemiological information. The expected result is Negative.  Fact Sheet for Patients:  EntrepreneurPulse.com.au  Fact Sheet for Healthcare Providers:  IncredibleEmployment.be  This test is no t yet approved or cleared by the [place] FDA and  has been authorized for detection and/or diagnosis of SARS-CoV-2 by FDA under an Emergency Use Authorization (EUA). This EUA will remain  in effect (meaning this test can be used) for the duration of the COVID-19 declaration under Section 564(b)(1) of the Act, 21 U.S.C.section 360bbb-3(b)(1), unless the authorization is terminated  or revoked sooner.       Influenza A by PCR NEGATIVE NEGATIVE   Influenza B by PCR NEGATIVE NEGATIVE    Comment: (NOTE) The Xpert Xpress SARS-CoV-2/FLU/RSV plus assay is intended as an aid in the diagnosis of influenza from Nasopharyngeal swab specimens and should not be used as a sole basis for treatment. Nasal washings and aspirates are unacceptable for Xpert Xpress SARS-CoV-2/FLU/RSV testing.  Fact Sheet for Patients: EntrepreneurPulse.com.au  Fact Sheet for Healthcare Providers: IncredibleEmployment.be  This test is not yet approved or cleared by the [place] FDA and has been authorized for detection and/or diagnosis of SARS-CoV-2 by FDA under an Emergency Use Authorization (EUA). This EUA will remain in effect (meaning this test can be used) for the duration of the COVID-19  declaration under Section 564(b)(1) of the Act, 21 U.S.C. section 360bbb-3(b)(1), unless the authorization is terminated or revoked.  Performed at [facility], [place] [address], [place], [place] [address]   Lactic acid, plasma     Status: None   Collection Time: 10/09/21 10:41 PM  Result Value Ref Range   Lactic Acid, Venous 0.6 0.5 - 1.9 mmol/L    Comment: Performed at [facility], [place] [address] [place], [place] [address]  Comprehensive metabolic panel     Status: Abnormal   Collection Time: 10/09/21 10:41 PM  Result Value Ref Range   Sodium 140 135 - 145 mmol/L   Potassium 3.9 3.5 - 5.1 mmol/L   Chloride 111 98 - 111 mmol/L   CO2 20 (L) 22 - 32  mmol/L   Glucose, Bld 98 70 - 99 mg/dL    Comment: Glucose reference range applies only to samples taken after fasting for at least 8 hours.   BUN 20 6 - 20 mg/dL   Creatinine, Ser 1.14 (H) 0.44 - 1.00 mg/dL   Calcium 9.3 8.9 - 10.3 mg/dL   Total Protein 7.0 6.5 - 8.1 g/dL   Albumin 3.3 (L) 3.5 - 5.0 g/dL   AST 17 15 - 41 U/L   ALT 10 0 - 44 U/L   Alkaline Phosphatase 104 38 - 126 U/L   Total Bilirubin 0.6 0.3 - 1.2 mg/dL   GFR, Estimated 60 (L) >60 mL/min    Comment: (NOTE) Calculated using the CKD-EPI Creatinine Equation (2021)    Anion gap 9 5 - 15    Comment: Performed at [facility], [place] [address] [place], [place] [address]  CBC WITH DIFFERENTIAL     Status: Abnormal   Collection Time: 10/09/21 10:41 PM  Result Value Ref Range   WBC 9.1 4.0 - 10.5 K/uL   RBC 1.42 (L) 3.87 - 5.11 MIL/uL   Hemoglobin 3.0 (LL) 12.0 - 15.0 g/dL    Comment: REPEATED TO VERIFY THIS CRITICAL RESULT HAS VERIFIED AND BEEN CALLED TO NATASHA WHEATLEY RN. BY TAMEECO CALDWELL ON 10 18 2022 AT 2318, AND HAS BEEN READ BACK.     HCT 12.0 (L) 36.0 - 46.0 %   MCV 84.5 80.0 - 100.0 fL   MCH 21.1 (L) 26.0 - 34.0 pg   MCHC 25.0 (L) 30.0 - 36.0 g/dL   RDW 19.4 (H) 11.5 - 15.5 %   Platelets 336 150  - 400 K/uL    Comment: SPECIMEN CHECKED FOR CLOTS REPEATED TO VERIFY    nRBC 0.3 (H) 0.0 - 0.2 %   Neutrophils Relative % 82 %   Neutro Abs 7.4 1.7 - 7.7 K/uL   Lymphocytes Relative 9 %   Lymphs Abs 0.8 0.7 - 4.0 K/uL   Monocytes Relative 8 %   Monocytes Absolute 0.8 0.1 - 1.0 K/uL   Eosinophils Relative 0 %   Eosinophils Absolute 0.0 0.0 - 0.5 K/uL   Basophils Relative 0 %   Basophils Absolute 0.0 0.0 - 0.1 K/uL   Immature Granulocytes 1 %   Abs Immature Granulocytes 0.05 0.00 - 0.07 K/uL    Comment: Performed at [facility], [place] [address] [place], [place] [address]  CBC with Differential/Platelet     Status: Abnormal   Collection Time: 10/09/21 10:58 PM  Result Value Ref Range   WBC 9.2 4.0 - 10.5 K/uL   RBC 1.38 (L) 3.87 - 5.11 MIL/uL   Hemoglobin 2.9 (LL) 12.0 - 15.0 g/dL    Comment: CRITICAL VALUE NOTED.  VALUE IS CONSISTENT WITH PREVIOUSLY REPORTED AND CALLED VALUE. REPEATED TO VERIFY VERIFIED BY RECOLLECT THIS CRITICAL RESULT HAS VERIFIED AND BEEN CALLED TO [place] GOODWIN RN. BY TAMEECO CALDWELL ON 10 18 2022 AT 2339, AND HAS BEEN READ BACK.     HCT 11.1 (L) 36.0 - 46.0 %   MCV 80.4 80.0 - 100.0 fL   MCH 21.0 (L) 26.0 - 34.0 pg   MCHC 26.1 (L) 30.0 - 36.0 g/dL   RDW 19.4 (H) 11.5 - 15.5 %   Platelets 336 150 - 400 K/uL   nRBC 0.3 (H) 0.0 - 0.2 %   Neutrophils Relative % 82 %   Neutro Abs 7.7 1.7 - 7.7 K/uL   Lymphocytes Relative 9 %  Lymphs Abs 0.8 0.7 - 4.0 K/uL   Monocytes Relative 8 %   Monocytes Absolute 0.7 0.1 - 1.0 K/uL   Eosinophils Relative 0 %   Eosinophils Absolute 0.0 0.0 - 0.5 K/uL   Basophils Relative 0 %   Basophils Absolute 0.0 0.0 - 0.1 K/uL   Immature Granulocytes 1 %   Abs Immature Granulocytes 0.06 0.00 - 0.07 K/uL    Comment: Performed at [facility], [place] [address] [place], [place] [address]  Lactic acid, plasma     Status: None   Collection Time: 10/09/21 10:59 PM  Result Value Ref Range    Lactic Acid, Venous 0.6 0.5 - 1.9 mmol/L    Comment: Performed at [facility], [place] [address] [place], [place] [address]  Type and screen [place]     Status: None (Preliminary result)   Collection Time: 10/09/21 10:59 PM  Result Value Ref Range   ABO/RH(D) O POS    Antibody Screen NEG    Sample Expiration      10/12/2021,2359 Performed at [place] [address] [place], [place] [address]    Unit Number [id number]    Blood Component Type RED CELLS,LR    Unit division 00    Status of Unit ISSUED,FINAL    Unit tag comment EMERGENCY RELEASE    Transfusion Status OK TO TRANSFUSE    Crossmatch Result COMPATIBLE    Unit Number [id number]    Blood Component Type RED CELLS,LR    Unit division 00    Status of Unit REL FROM [facility]    Unit tag comment      VERBAL ORDERS PER DR NOTIFIED BY RN [place] AT 2703   Transfusion Status OK TO TRANSFUSE    Crossmatch Result COMPATIBLE    Unit Number [id number]    Blood Component Type RBC LR PHER2    Unit division 00    Status of Unit ISSUED    Donor AG Type NEGATIVE FOR E ANTIGEN    Transfusion Status OK TO TRANSFUSE    Crossmatch Result COMPATIBLE    Unit Number [id number]    Blood Component Type RED CELLS,LR    Unit division 00    Status of Unit ALLOCATED    Donor AG Type NEGATIVE FOR E ANTIGEN    Transfusion Status OK TO TRANSFUSE    Crossmatch Result COMPATIBLE    Unit Number [id number]    Blood Component Type RBC LR PHER1    Unit division 00    Status of Unit ISSUED    Donor AG Type NEGATIVE FOR E ANTIGEN    Transfusion Status OK TO TRANSFUSE    Crossmatch Result COMPATIBLE    Unit Number [id number]    Blood Component Type RED CELLS,LR    Unit division 00    Status of Unit ISSUED    Donor AG Type NEGATIVE FOR E ANTIGEN    Transfusion Status OK TO TRANSFUSE    Crossmatch Result COMPATIBLE   Protime-INR     Status: Abnormal   Collection Time:  10/09/21 11:00 PM  Result Value Ref Range   Prothrombin Time 20.3 (H) 11.4 - 15.2 seconds   INR 1.7 (H) 0.8 - 1.2    Comment: (NOTE) INR goal varies based on device and disease states. Performed at [facility], [place] [address] [place], [place] [address]   APTT     Status: Abnormal   Collection Time: 10/09/21 11:00 PM  Result Value Ref Range   aPTT 46 (H) 24 - 36 seconds    Comment:        IF BASELINE aPTT IS ELEVATED, SUGGEST PATIENT RISK ASSESSMENT BE USED TO DETERMINE APPROPRIATE ANTICOAGULANT THERAPY. Performed at [facility], [place] [address] [place], [place] [address]   I-Stat beta hCG blood, ED     Status: None   Collection Time: 10/09/21 11:10 PM  Result Value Ref Range   I-stat hCG, quantitative <5.0 <5 mIU/mL   Comment 3            Comment:   GEST. AGE      CONC.  (mIU/mL)   <=1 WEEK        5 - 50     2 WEEKS       50 - 500     3 WEEKS       100 - 10,000     4 WEEKS     1,000 - 30,000        FEMALE AND NON-PREGNANT FEMALE:     LESS THAN 5 mIU/mL   Prepare RBC (crossmatch)     Status: None   Collection Time: 10/09/21 11:43 PM  Result Value Ref Range   Order Confirmation      ORDER PROCESSED BY BLOOD BANK Performed at [place] [address], [place], [place] [address]   Fibrinogen     Status: None   Collection Time: 10/10/21 12:03 AM  Result Value Ref Range   Fibrinogen 364 210 - 475 mg/dL    Comment: (NOTE) Fibrinogen results may be underestimated in patients receiving thrombolytic therapy. Performed at [facility], [place] [address], [place], [place] [address]   Hemoglobin and hematocrit, blood (STAT)     Status: Abnormal   Collection Time: 10/10/21 12:03 AM  Result Value Ref Range   Hemoglobin 3.1 (LL) 12.0 - 15.0 g/dL    Comment: CRITICAL VALUE NOTED.  VALUE IS CONSISTENT WITH PREVIOUSLY REPORTED AND CALLED VALUE. REPEATED TO VERIFY    HCT 11.7 (L) 36.0 - 46.0 %     Comment: Performed at [facility], [place] [address] [place], [place] [address]  Blood gas, venous (at [facility] and [facility], not at [facility])     Status: Abnormal   Collection Time: 10/10/21 12:05 AM  Result Value Ref Range   pH, Ven 7.477 (H) 7.250 - 7.430   pCO2, Ven 31.5 (L) 44.0 - 60.0 mmHg   pO2, Ven 168.0 (H) 32.0 - 45.0 mmHg   Bicarbonate 23.0 20.0 - 28.0 mmol/L   Acid-base deficit 0.3 0.0 - 2.0 mmol/L   O2 Saturation 100.0 %   Patient temperature 98.6     Comment: Performed at [facility], [place] [address] [place], [place] [address]  Vitamin B12     Status: Abnormal   Collection Time: 10/10/21 12:18 AM  Result Value Ref Range   Vitamin B-12 1,206 (H) 180 - 914 pg/mL    Comment: (NOTE) This assay is not validated for testing neonatal or myeloproliferative syndrome specimens for Vitamin B12 levels. Performed at [facility], [place] [address] [place],  [address]   Folate     Status: None   Collection Time: 10/10/21 12:18 AM  Result Value Ref Range   Folate 11.3 >5.9 ng/mL    Comment: Performed at [facility], [place] [address] [place], [place] [address]  Iron and TIBC  Status: Abnormal   Collection Time: 10/10/21 12:18 AM  Result Value Ref Range   Iron 8 (L) 28 - 170 ug/dL   TIBC 302 250 - 450 ug/dL   Saturation Ratios 3 (L) 10.4 - 31.8 %   UIBC 294 ug/dL    Comment: Performed at [facility], [place] [address], [place], [place] [address]  Ferritin     Status: None   Collection Time: 10/10/21 12:18 AM  Result Value Ref Range   Ferritin 19 11 - 307 ng/mL    Comment: Performed at [facility], [place] [address], [place], [place] [address]  Reticulocytes     Status: Abnormal   Collection Time: 10/10/21 12:18 AM  Result Value Ref Range   Retic Ct Pct 5.6 (H) 0.4 - 3.1 %   RBC. 1.94 (L) 3.87 - 5.11 MIL/uL   Retic Count, Absolute 108.8 19.0 - 186.0 K/uL   Immature Retic  Fract 26.8 (H) 2.3 - 15.9 %    Comment: Performed at [facility], [place] [address] [place], [place] [address]  DIC Panel now then every 30 minutes     Status: Abnormal   Collection Time: 10/10/21 12:18 AM  Result Value Ref Range   Prothrombin Time 20.3 (H) 11.4 - 15.2 seconds   INR 1.7 (H) 0.8 - 1.2    Comment: (NOTE) INR goal varies based on device and disease states.    aPTT 46 (H) 24 - 36 seconds    Comment:        IF BASELINE aPTT IS ELEVATED, SUGGEST PATIENT RISK ASSESSMENT BE USED TO DETERMINE APPROPRIATE ANTICOAGULANT THERAPY.    Fibrinogen 359 210 - 475 mg/dL    Comment: (NOTE) Fibrinogen results may be underestimated in patients receiving thrombolytic therapy.    D-Dimer, Quant 7.55 (H) 0.00 - 0.50 ug/mL-FEU    Comment: (NOTE) At the manufacturer cut-off value of 0.5 g/mL FEU, this assay has a negative predictive value of 95-100%.This assay is intended for use in conjunction with a clinical pretest probability (PTP) assessment model to exclude pulmonary embolism (PE) and deep venous thrombosis (DVT) in outpatients suspected of PE or DVT. Results should be correlated with clinical presentation.    Platelets 331 150 - 400 K/uL   Smear Review NO SCHISTOCYTES SEEN     Comment: Performed at [facility], [place] [address], [place], [place] [address]  Glucose, capillary     Status: Abnormal   Collection Time: 10/10/21  1:44 AM  Result Value Ref Range   Glucose-Capillary 113 (H) 70 - 99 mg/dL    Comment: Glucose reference range applies only to samples taken after fasting for at least 8 hours.  Prepare fresh frozen plasma     Status: None (Preliminary result)   Collection Time: 10/10/21  7:14 AM  Result Value Ref Range   Unit Number [id number]    Blood Component Type THW PLS APHR    Unit division A0    Status of Unit ISSUED    Transfusion Status      OK TO TRANSFUSE Performed at [place] [address], [place], [place] [address]   CBC     Status: Abnormal   Collection Time: 10/10/21 12:44 PM  Result Value Ref Range   WBC 9.0 4.0 - 10.5 K/uL   RBC 4.02 3.87 - 5.11 MIL/uL   Hemoglobin 11.1 (L) 12.0 - 15.0 g/dL    Comment: REPEATED TO VERIFY POST TRANSFUSION SPECIMEN DELTA  CHECK NOTED    HCT 35.1 (L) 36.0 - 46.0 %   MCV 87.3 80.0 - 100.0 fL    Comment: POST TRANSFUSION SPECIMEN REPEATED TO VERIFY DELTA CHECK NOTED    MCH 27.6 26.0 - 34.0 pg   MCHC 31.6 30.0 - 36.0 g/dL   RDW 17.2 (H) 11.5 - 15.5 %   Platelets 332 150 - 400 K/uL   nRBC 0.8 (H) 0.0 - 0.2 %    Comment: Performed at [facility], [place] [address] [place], [place] [address]  APTT     Status: None   Collection Time: 10/10/21 12:44 PM  Result Value Ref Range   aPTT 35 24 - 36 seconds    Comment: Performed at [facility], [place] [address], [place], [place] [address]  Protime-INR     Status: Abnormal   Collection Time: 10/10/21 12:44 PM  Result Value Ref Range   Prothrombin Time 15.8 (H) 11.4 - 15.2 seconds   INR 1.3 (H) 0.8 - 1.2    Comment: (NOTE) INR goal varies based on device and disease states. Performed at [facility], [place] [address] [place], [place] [address]   Urinalysis, Routine w reflex microscopic     Status: Abnormal   Collection Time: 10/10/21  2:32 PM  Result Value Ref Range   Color, Urine YELLOW YELLOW   APPearance CLEAR CLEAR   Specific Gravity, Urine 1.014 1.005 - 1.030   pH 6.0 5.0 - 8.0   Glucose, UA NEGATIVE NEGATIVE mg/dL   Hgb urine dipstick LARGE (A) NEGATIVE   Bilirubin Urine NEGATIVE NEGATIVE   Ketones, ur NEGATIVE NEGATIVE mg/dL   Protein, ur 30 (A) NEGATIVE mg/dL   Nitrite NEGATIVE NEGATIVE   Leukocytes,Ua NEGATIVE NEGATIVE   RBC / HPF >50 (H) 0 - 5 RBC/hpf   WBC, UA 11-20 0 - 5 WBC/hpf   Bacteria, UA NONE SEEN NONE SEEN   Squamous Epithelial / LPF 0-5 0 - 5   Mucus PRESENT     Comment: Performed at [facility], [place] [address] [place], [place] [address]    DG Abd 1 View  Result Date: 10/10/2021 CLINICAL DATA:  Abdominal distension EXAM: ABDOMEN - 1 VIEW COMPARISON:  None. FINDINGS: There is no evidence of bowel obstruction. Mass effect from known pelvic mass. There are no radiopaque calculi overlying the kidneys or course of the ureters. No acute osseous abnormality. IMPRESSION: No evidence of bowel obstruction. Electronically Signed   By: Maurine Simmering M.D.   On: 10/10/2021 14:31   US PELVIS (TRANSABDOMINAL ONLY)  Result Date: 10/10/2021 CLINICAL DATA:  Pelvic pain, bleeding, uterine fibroids EXAM: TRANSABDOMINAL ULTRASOUND OF PELVIS TECHNIQUE: Transabdominal ultrasound examination of the pelvis was performed including evaluation of the uterus, ovaries, adnexal regions, and pelvic cul-de-sac. COMPARISON:  CT abdomen/pelvis dated 01/11/2021 FINDINGS: Uterus Measurements: 24.7 x 13.7 x 22.5 cm = volume: 3982 mL. Multiple uterine fibroids, including: --6.5 x 4.9 x 5.2 cm intramural fibroid in the right posterior uterine body --6.2 x 6.3 x 5.1 cm intramural fibroid in the right posterior uterine fundus --4.1 x 3.1 x 4.4 cm subserosal fibroid in the left anterior uterine body Endometrium Thickness: 27 mm. Heterogeneous thickening with complex soft tissue/fluid in the endometrial cavity. Right ovary Not discretely visualized.  No adnexal mass is seen. Left ovary Not discretely visualized.  No adnexal mass is seen. Other findings:  No abnormal free fluid. IMPRESSION: Enlarged uterus with multiple uterine fibroids, measuring up to 6.5  cm, as above. Heterogeneous, thickened endometrium, measuring 2.7 cm. Endometrial sampling is suggested. Electronically Signed   By: Julian Hy M.D.   On: 10/10/2021 02:54   DG Chest Port 1 View  Result Date: 10/09/2021 CLINICAL DATA:  Anemia EXAM: PORTABLE CHEST 1 VIEW COMPARISON:  06/02/2021 FINDINGS: Lungs are clear.  No pleural effusion or pneumothorax.  Skin fold along the right lateral hemithorax. Cardiomegaly. IMPRESSION: Cardiomegaly. No evidence of acute cardiopulmonary disease. Electronically Signed   By: Julian Hy M.D.   On: 10/09/2021 22:17

## 2021-10-10 NOTE — Progress Notes (Signed)
Pharmacy Antibiotic Note  Holly Bradley is a 45 y.o. female admitted on 10/09/2021 with  Hgb of 3, fever of unknown source.  Pharmacy has been consulted to dose vancomycin and cefepime for sepsis.  1st doses given in ED  Plan: Vancomycin 1250mg  IV q24h (AUC 511, Scr 1.14) Cefepime 2gm IV q12h Follow renal function, cultures and clinical course  Height: 5\' 5"  (165.1 cm) Weight: 57.5 kg (126 lb 12.2 oz) IBW/kg (Calculated) : 57  Temp (24hrs), Avg:99.3 F (37.4 C), Min:97.8 F (36.6 C), Max:102 F (38.9 C)  Recent Labs  Lab 10/09/21 2241 10/09/21 2258 10/09/21 2259  WBC 9.1 9.2  --   CREATININE 1.14*  --   --   LATICACIDVEN 0.6  --  0.6    Estimated Creatinine Clearance: 54.9 mL/min (A) (by C-G formula based on SCr of 1.14 mg/dL (H)).    No Known Allergies  Antimicrobials this admission: 10/19 vanc >> 10/19 cefepime >> 10819 flagyl x 1  Dose adjustments this admission:   Microbiology results: 10/18 BCx:  10/18 UCx:   10/19 MRSA PCR:   Thank you for allowing pharmacy to be a part of this patient's care.  Dolly Rias RPh 10/10/2021, 4:01 AM

## 2021-10-10 NOTE — Progress Notes (Signed)
Patient's SBP sustaining in 180's -200's. This RN is unable to administer PRN hydralazine at this time. Pharmacy was still reviewing her med rec from her facility and was just now able to verify her other ordered antihypertensive medications. Dr Wyline Copas was notified by this RN; verbal orders to administer meds as ordered when pharmacy finishes verification. This RN will continue to carefully monitor BP.

## 2021-10-10 NOTE — Plan of Care (Signed)
  Problem: Safety: Goal: Ability to remain free from injury will improve 10/10/2021 0447 by Maryann Alar, RN Outcome: Progressing 10/10/2021 0447 by Maryann Alar, RN Outcome: Progressing   Problem: Fluid Volume: Goal: Hemodynamic stability will improve 10/10/2021 0447 by Maryann Alar, RN Outcome: Progressing 10/10/2021 0447 by Maryann Alar, RN Outcome: Progressing   Problem: Clinical Measurements: Goal: Diagnostic test results will improve 10/10/2021 0447 by Maryann Alar, RN Outcome: Progressing 10/10/2021 0447 by Maryann Alar, RN Outcome: Progressing Goal: Signs and symptoms of infection will decrease 10/10/2021 0447 by Maryann Alar, RN Outcome: Progressing 10/10/2021 0447 by Maryann Alar, RN Outcome: Progressing   Problem: Respiratory: Goal: Ability to maintain adequate ventilation will improve 10/10/2021 0447 by Maryann Alar, RN Outcome: Progressing 10/10/2021 0447 by Maryann Alar, RN Outcome: Progressing

## 2021-10-10 NOTE — Progress Notes (Signed)
PROGRESS NOTE    Holly Bradley  VZD:[id number] DOB: 05-05-1976 DOA: 10/09/2021 PCP: Mitzi Hansen, MD    Brief Narrative:  45 y.o. female with history of CVA with hx of fibroids, left-sided hemiplegia, SVT, CHF, chronic anemia, hypertension was brought to the ER after patient's hemoglobin was found to be low and patient also has been having continuous menorrhagia.  Assessment & Plan:   Principal Problem:   Acute blood loss anemia Active Problems:   Menorrhagia   Uncontrolled hypertension   Hemorrhagic stroke (HCC)   Uterine fibroid   Left hemiparesis (HCC)   Acute blood loss anemia secondary to continuous menorrhagia Pt is now s/p tranexamic acid in ED with 5 units PRBC's being transfused, in addition to FFP EDP discussed with Ob/Gyn who recommended continuing on megace Pt is hemodynamically stable at this time Have discussed case with Ob/Gyn who will see in consultation Cont to follow CBC trends and cont to transfuse to keep hgb >7 Fever with concern developing sepsis for which blood cultures were obtained and started on empiric antibiotics. UA notable only for blood, correlating to presenting menorrhagia CXR unremarkable F/u on blood cx. Low threshold to hold further abx if no source is identified Uncontrolled hypertension Was continued on clonidine hydralazine coverage on Imdur Entresto and as needed IV hydralazine. Prior history of CVA with left-sided hemiplegia. Seems stable at this time History of CHF  Thus far tolerating PRBC transfusion History of SVT on amiodarone.   DVT prophylaxis: SCD's Code Status: No CPR Family Communication: Pt in room, family at bedside  Status is: Inpatient  Remains inpatient appropriate because: acute illness needing PRBC transfusion   Consultants:  Ob/Gyn  Procedures:    Antimicrobials: Anti-infectives (From admission, onward)    Start     Dose/Rate Route Frequency Ordered Stop   10/10/21 2200  vancomycin (VANCOREADY)  IVPB 1250 mg/250 mL        1,250 mg 166.7 mL/hr over 90 Minutes Intravenous Every 24 hours 10/10/21 0402     10/10/21 1200  ceFEPIme (MAXIPIME) 2 g in sodium chloride 0.9 % 100 mL IVPB        2 g 200 mL/hr over 30 Minutes Intravenous Every 12 hours 10/10/21 0402     10/09/21 2345  ceFEPIme (MAXIPIME) 2 g in sodium chloride 0.9 % 100 mL IVPB        2 g 200 mL/hr over 30 Minutes Intravenous  Once 10/09/21 2343 10/10/21 0046   10/09/21 2345  metroNIDAZOLE (FLAGYL) IVPB 500 mg        500 mg 100 mL/hr over 60 Minutes Intravenous  Once 10/09/21 2343 10/10/21 0135   10/09/21 2345  vancomycin (VANCOCIN) IVPB 1000 mg/200 mL premix        1,000 mg 200 mL/hr over 60 Minutes Intravenous  Once 10/09/21 2343 10/10/21 0135       Subjective: Denies chest pain or sob. Reports constipation over past several weeks  Objective: Vitals:   10/10/21 1130 10/10/21 1200 10/10/21 1230 10/10/21 1300  BP: (!) 172/79 (!) 189/86 (!) 174/84 (!) 190/81  Pulse: 92 90 78 77  Resp: 18 18 16 20   Temp:  97.8 F (36.6 C)    TempSrc:  Oral    SpO2: 100% 100% 99% 100%  Weight:      Height:        Intake/Output Summary (Last 24 hours) at 10/10/2021 1435 Last data filed at 10/10/2021 1301 Gross per 24 hour  Intake 3967.62 ml  Output --  Net  3967.62 ml   Filed Weights   10/09/21 2215 10/10/21 0200  Weight: 68 kg 57.5 kg    Examination: General exam: Awake, laying in bed, in nad Respiratory system: Normal respiratory effort, no wheezing Cardiovascular system: regular rate, s1, s2 Gastrointestinal system: decreased BS, nondistended Central nervous system: CN2-12 grossly intact, strength intact Extremities: Perfused, no clubbing Skin: Normal skin turgor, no notable skin lesions seen Psychiatry: Mood normal // no visual hallucinations   Data Reviewed: I have personally reviewed following labs and imaging studies  CBC: Recent Labs  Lab 10/09/21 2241 10/09/21 2258 10/10/21 0003 10/10/21 0018  10/10/21 1244  WBC 9.1 9.2  --   --  9.0  NEUTROABS 7.4 7.7  --   --   --   HGB 3.0* 2.9* 3.1*  --  11.1*  HCT 12.0* 11.1* 11.7*  --  35.1*  MCV 84.5 80.4  --   --  87.3  PLT 336 336  --  331 564   Basic Metabolic Panel: Recent Labs  Lab 10/09/21 2241  NA 140  K 3.9  CL 111  CO2 20*  GLUCOSE 98  BUN 20  CREATININE 1.14*  CALCIUM 9.3   GFR: Estimated Creatinine Clearance: 54.9 mL/min (A) (by C-G formula based on SCr of 1.14 mg/dL (H)). Liver Function Tests: Recent Labs  Lab 10/09/21 2241  AST 17  ALT 10  ALKPHOS 104  BILITOT 0.6  PROT 7.0  ALBUMIN 3.3*   No results for input(s): LIPASE, AMYLASE in the last 168 hours. No results for input(s): AMMONIA in the last 168 hours. Coagulation Profile: Recent Labs  Lab 10/09/21 2300 10/10/21 0018 10/10/21 1244  INR 1.7* 1.7* 1.3*   Cardiac Enzymes: No results for input(s): CKTOTAL, CKMB, CKMBINDEX, TROPONINI in the last 168 hours. BNP (last 3 results) No results for input(s): PROBNP in the last 8760 hours. HbA1C: No results for input(s): HGBA1C in the last 72 hours. CBG: Recent Labs  Lab 10/10/21 0144  GLUCAP 113*   Lipid Profile: No results for input(s): CHOL, HDL, LDLCALC, TRIG, CHOLHDL, LDLDIRECT in the last 72 hours. Thyroid Function Tests: No results for input(s): TSH, T4TOTAL, FREET4, T3FREE, THYROIDAB in the last 72 hours. Anemia Panel: Recent Labs    10/10/21 0018  VITAMINB12 1,206*  FOLATE 11.3  FERRITIN 19  TIBC 302  IRON 8*  RETICCTPCT 5.6*   Sepsis Labs: Recent Labs  Lab 10/09/21 2241 10/09/21 2259  LATICACIDVEN 0.6 0.6    Recent Results (from the past 240 hour(s))  Resp Panel by RT-PCR (Flu A&B, Covid) Nasopharyngeal Swab     Status: None   Collection Time: 10/09/21  9:52 PM   Specimen: Nasopharyngeal Swab; Nasopharyngeal(NP) swabs in vial transport medium  Result Value Ref Range Status   SARS Coronavirus 2 by RT PCR NEGATIVE NEGATIVE Final    Comment: (NOTE) SARS-CoV-2 target  nucleic acids are NOT DETECTED.  The SARS-CoV-2 RNA is generally detectable in upper respiratory specimens during the acute phase of infection. The lowest concentration of SARS-CoV-2 viral copies this assay can detect is 138 copies/mL. A negative result does not preclude SARS-Cov-2 infection and should not be used as the sole basis for treatment or other patient management decisions. A negative result may occur with  improper specimen collection/handling, submission of specimen other than nasopharyngeal swab, presence of viral mutation(s) within the areas targeted by this assay, and inadequate number of viral copies(<138 copies/mL). A negative result must be combined with clinical observations, patient history, and epidemiological information. The  expected result is Negative.  Fact Sheet for Patients:  EntrepreneurPulse.com.au  Fact Sheet for Healthcare Providers:  IncredibleEmployment.be  This test is no t yet approved or cleared by the [place] FDA and  has been authorized for detection and/or diagnosis of SARS-CoV-2 by FDA under an Emergency Use Authorization (EUA). This EUA will remain  in effect (meaning this test can be used) for the duration of the COVID-19 declaration under Section 564(b)(1) of the Act, 21 U.S.C.section 360bbb-3(b)(1), unless the authorization is terminated  or revoked sooner.       Influenza A by PCR NEGATIVE NEGATIVE Final   Influenza B by PCR NEGATIVE NEGATIVE Final    Comment: (NOTE) The Xpert Xpress SARS-CoV-2/FLU/RSV plus assay is intended as an aid in the diagnosis of influenza from Nasopharyngeal swab specimens and should not be used as a sole basis for treatment. Nasal washings and aspirates are unacceptable for Xpert Xpress SARS-CoV-2/FLU/RSV testing.  Fact Sheet for Patients: EntrepreneurPulse.com.au  Fact Sheet for Healthcare  Providers: IncredibleEmployment.be  This test is not yet approved or cleared by the [place] FDA and has been authorized for detection and/or diagnosis of SARS-CoV-2 by FDA under an Emergency Use Authorization (EUA). This EUA will remain in effect (meaning this test can be used) for the duration of the COVID-19 declaration under Section 564(b)(1) of the Act, 21 U.S.C. section 360bbb-3(b)(1), unless the authorization is terminated or revoked.  Performed at [facility], [place] [address] [place], [place] [address]      Radiology Studies: DG Abd 1 View  Result Date: 10/10/2021 CLINICAL DATA:  Abdominal distension EXAM: ABDOMEN - 1 VIEW COMPARISON:  None. FINDINGS: There is no evidence of bowel obstruction. Mass effect from known pelvic mass. There are no radiopaque calculi overlying the kidneys or course of the ureters. No acute osseous abnormality. IMPRESSION: No evidence of bowel obstruction. Electronically Signed   By: Maurine Simmering M.D.   On: 10/10/2021 14:31   US PELVIS (TRANSABDOMINAL ONLY)  Result Date: 10/10/2021 CLINICAL DATA:  Pelvic pain, bleeding, uterine fibroids EXAM: TRANSABDOMINAL ULTRASOUND OF PELVIS TECHNIQUE: Transabdominal ultrasound examination of the pelvis was performed including evaluation of the uterus, ovaries, adnexal regions, and pelvic cul-de-sac. COMPARISON:  CT abdomen/pelvis dated 01/11/2021 FINDINGS: Uterus Measurements: 24.7 x 13.7 x 22.5 cm = volume: 3982 mL. Multiple uterine fibroids, including: --6.5 x 4.9 x 5.2 cm intramural fibroid in the right posterior uterine body --6.2 x 6.3 x 5.1 cm intramural fibroid in the right posterior uterine fundus --4.1 x 3.1 x 4.4 cm subserosal fibroid in the left anterior uterine body Endometrium Thickness: 27 mm. Heterogeneous thickening with complex soft tissue/fluid in the endometrial cavity. Right ovary Not discretely visualized.  No adnexal mass is seen. Left ovary Not discretely  visualized.  No adnexal mass is seen. Other findings:  No abnormal free fluid. IMPRESSION: Enlarged uterus with multiple uterine fibroids, measuring up to 6.5 cm, as above. Heterogeneous, thickened endometrium, measuring 2.7 cm. Endometrial sampling is suggested. Electronically Signed   By: Julian Hy M.D.   On: 10/10/2021 02:54   DG Chest Port 1 View  Result Date: 10/09/2021 CLINICAL DATA:  Anemia EXAM: PORTABLE CHEST 1 VIEW COMPARISON:  06/02/2021 FINDINGS: Lungs are clear.  No pleural effusion or pneumothorax. Skin fold along the right lateral hemithorax. Cardiomegaly. IMPRESSION: Cardiomegaly. No evidence of acute cardiopulmonary disease. Electronically Signed   By: Julian Hy M.D.   On: 10/09/2021 22:17    Scheduled Meds:  amiodarone  200 mg Oral Daily  atorvastatin  40 mg Oral Daily   baclofen  20 mg Oral TID   carvedilol  25 mg Oral BID WC   Chlorhexidine Gluconate Cloth  6 each Topical Daily   cloNIDine  0.1 mg Oral TID   feeding supplement  237 mL Oral TID BM   hydrALAZINE  100 mg Oral TID   isosorbide mononitrate  60 mg Oral Daily   mouth rinse  15 mL Mouth Rinse BID   megestrol  40 mg Oral BID   nystatin  5 mL Oral QID   sacubitril-valsartan  1 tablet Oral BID   Continuous Infusions:  ceFEPime (MAXIPIME) IV Stopped (10/10/21 1205)   lactated ringers 150 mL/hr at 10/10/21 1301   vancomycin       LOS: 0 days   Marylu Lund, MD [facility] Pager On Amion  If 7PM-7AM, please contact night-coverage 10/10/2021, 2:35 PM

## 2021-10-10 NOTE — H&P (Addendum)
History and Physical    Holly Bradley CBS:[id number] DOB: 13-Aug-1976 DOA: 10/09/2021  PCP: Mitzi Hansen, MD  Patient coming from: [facility].  Chief Complaint: Low hemoglobin.  HPI: Holly Bradley is a 45 y.o. female with history of CVA with left-sided hemiplegia, SVT, CHF, chronic anemia, hypertension was brought to the ER after patient's hemoglobin was found to be low and patient also has been having continuous menorrhagia.  ED Course: In the ER patient was febrile with temperature 102 F tachycardic.  Patient's hemoglobin was found to be around 2.9.  ER physician did discuss with on-call OB/GYN Dr. Roland Rack who advised getting pelvic ultrasound transfusing and they will be seeing patient in consult.  Patient had blood cultures drawn chest x-ray was unremarkable COVID test was negative.  UA is pending.  Patient also was started on empiric antibiotics for possible developing sepsis.  Review of Systems: As per HPI, rest all negative.   Past Medical History:  Diagnosis Date   CHF (congestive heart failure) (HCC)    Chronic blood loss anemia    Related to uterine fibroids   Essential hypertension    Poorly controlled, repeat by report only on nicardipine 90 mg   Fibroid    Likely complicated by by menometrorrhagia and chronic anemia   Hypertension    Paroxysmal SVT (supraventricular tachycardia) (HCC)    Frequent episodes, can last anywhere from 20 minutes to 12-15 hours.  Not on beta-blocker or calcium channel blocker   Uterine fibroid     Past Surgical History:  Procedure Laterality Date   IR ANGIOGRAM FOLLOW UP STUDY  05/15/2021   IR CT HEAD LTD  05/15/2021   IR PERCUTANEOUS ART THROMBECTOMY/INFUSION INTRACRANIAL INC DIAG ANGIO  05/15/2021       IR PERCUTANEOUS ART THROMBECTOMY/INFUSION INTRACRANIAL INC DIAG ANGIO  05/15/2021   IR TRANSCATH/EMBOLIZ  05/15/2021   RADIOLOGY WITH ANESTHESIA N/A 05/15/2021   Procedure: IR WITH ANESTHESIA;  Surgeon:  Radiologist, Medication, MD;  Location: [facility];  Service: Radiology;  Laterality: N/A;   Unknown       reports that she has been smoking. She has never used smokeless tobacco. She reports that she does not currently use alcohol. She reports that she does not currently use drugs after having used the following drugs: Marijuana.  No Known Allergies  Family History  Problem Relation Age of Onset   Dementia Mother     Prior to Admission medications   Medication Sig Start Date End Date Taking? Authorizing Provider  acetaminophen (TYLENOL) 325 MG tablet Place 2 tablets (650 mg total) into feeding tube every 4 (four) hours as needed for mild pain (or temp > 37.5 C (99.5 F)). 07/03/21   Samella Parr, NP  amiodarone (PACERONE) 200 MG tablet Take 200 mg by mouth daily. 02/02/21   [provider]  ARIPiprazole (ABILIFY) 2 MG tablet Take 2 mg by mouth daily.    [provider]  aspirin 81 MG chewable tablet Chew 1 tablet (81 mg total) by mouth daily. 07/04/21   Samella Parr, NP  atorvastatin (LIPITOR) 40 MG tablet Take 1 tablet (40 mg total) by mouth daily. 07/04/21   Samella Parr, NP  baclofen (LIORESAL) 20 MG tablet Take 20 mg by mouth 3 (three) times daily.    [provider]  carvedilol (COREG) 25 MG tablet Take 1 tablet (25 mg total) by mouth 2 (two) times daily with a meal. 07/03/21   Samella Parr, NP  cloNIDine (CATAPRES)  0.1 MG tablet Take 1 tablet (0.1 mg total) by mouth 3 (three) times daily. 07/03/21   Samella Parr, NP  ENTRESTO 97-103 MG Take 1 tablet by mouth 2 (two) times daily. 02/20/21   [provider]  feeding supplement (ENSURE ENLIVE / ENSURE PLUS) LIQD Take 237 mLs by mouth 3 (three) times daily between meals. 07/03/21   Samella Parr, NP  FEROSUL 325 (65 Fe) MG tablet Take 325 mg by mouth 2 (two) times daily. 02/20/21   [provider]  ferrous sulfate 325 (65 FE) MG tablet Take 1 tablet (325 mg total) by mouth 2 (two) times  daily with a meal. 02/20/21   Lyda Jester M, PA-C  gabapentin (NEURONTIN) 100 MG capsule Take 100 mg by mouth 3 (three) times daily.    [provider]  hydrALAZINE (APRESOLINE) 100 MG tablet Take 1 tablet (100 mg total) by mouth 3 (three) times daily. 03/02/21   Bensimhon, Shaune Pascal, MD  isosorbide mononitrate (IMDUR) 60 MG 24 hr tablet Take 60 mg by mouth daily. 02/02/21   [provider]  lactulose (CHRONULAC) 10 GM/15ML solution Take 15 mLs (10 g total) by mouth 2 (two) times daily. 07/03/21   Samella Parr, NP  megestrol (MEGACE) 40 MG tablet Take 40 mg by mouth 2 (two) times daily.    [provider]  Multiple Vitamin (MULTIVITAMIN WITH MINERALS) TABS tablet Take 1 tablet by mouth daily. 07/04/21   Samella Parr, NP  oxyCODONE HCl 7.5 MG TABS Take 7.5 mg by mouth every 4 (four) hours as needed. 07/03/21   Samella Parr, NP  sacubitril-valsartan (ENTRESTO) 97-103 MG Take 1 tablet by mouth 2 (two) times daily. 02/20/21   Lyda Jester M, PA-C  simethicone (MYLICON) 40 VP/7.1GG drops Take 0.6 mLs (40 mg total) by mouth every 6 (six) hours as needed for flatulence. 07/03/21   Samella Parr, NP  torsemide (DEMADEX) 20 MG tablet TAKE 2 TABLETS (40 MG TOTAL) BY MOUTH 2 (TWO) TIMES DAILY. 03/02/21 03/02/22  Bensimhon, Shaune Pascal, MD  traMADol (ULTRAM) 50 MG tablet Take by mouth every 6 (six) hours as needed.    [provider]    Physical Exam: Constitutional: Moderately built and nourished. Vitals:   10/10/21 0100 10/10/21 0145 10/10/21 0200 10/10/21 0300  BP: (!) 160/80 (!) 161/91 (!) 186/85 (!) 150/77  Pulse: 86 85 83   Resp: 15 14 12 12   Temp:  99.6 F (37.6 C) 99.6 F (37.6 C)   TempSrc:  Oral Oral   SpO2: 100% 100% 100% 100%  Weight:   57.5 kg   Height:   5\' 5"  (1.651 m)    Eyes: Anicteric no pallor. ENMT: No discharge from the ears eyes nose and mouth. Neck: No mass felt.  No neck rigidity. Respiratory: No rhonchi or  crepitations. Cardiovascular: S1-S2 heard. Abdomen: Distended nontender bowel sound present. Musculoskeletal: No edema. Skin: No rash. Neurologic: Alert awake oriented to her name and place left-sided hemiplegia. Psychiatric: Appears normal.  Normal affect.   Labs on Admission: I have personally reviewed following labs and imaging studies  CBC: Recent Labs  Lab 10/09/21 2241 10/09/21 2258 10/10/21 0003 10/10/21 0018  WBC 9.1 9.2  --   --   NEUTROABS 7.4 7.7  --   --   HGB 3.0* 2.9* 3.1*  --   HCT 12.0* 11.1* 11.7*  --   MCV 84.5 80.4  --   --   PLT 336 336  --  009   Basic Metabolic Panel: Recent Labs  Lab 10/09/21 2241  NA 140  K 3.9  CL 111  CO2 20*  GLUCOSE 98  BUN 20  CREATININE 1.14*  CALCIUM 9.3   GFR: Estimated Creatinine Clearance: 54.9 mL/min (A) (by C-G formula based on SCr of 1.14 mg/dL (H)). Liver Function Tests: Recent Labs  Lab 10/09/21 2241  AST 17  ALT 10  ALKPHOS 104  BILITOT 0.6  PROT 7.0  ALBUMIN 3.3*   No results for input(s): LIPASE, AMYLASE in the last 168 hours. No results for input(s): AMMONIA in the last 168 hours. Coagulation Profile: Recent Labs  Lab 10/09/21 2300 10/10/21 0018  INR 1.7* 1.7*   Cardiac Enzymes: No results for input(s): CKTOTAL, CKMB, CKMBINDEX, TROPONINI in the last 168 hours. BNP (last 3 results) No results for input(s): PROBNP in the last 8760 hours. HbA1C: No results for input(s): HGBA1C in the last 72 hours. CBG: Recent Labs  Lab 10/10/21 0144  GLUCAP 113*   Lipid Profile: No results for input(s): CHOL, HDL, LDLCALC, TRIG, CHOLHDL, LDLDIRECT in the last 72 hours. Thyroid Function Tests: No results for input(s): TSH, T4TOTAL, FREET4, T3FREE, THYROIDAB in the last 72 hours. Anemia Panel: Recent Labs    10/10/21 0018  VITAMINB12 1,206*  FOLATE 11.3  FERRITIN 19  TIBC 302  IRON 8*  RETICCTPCT 5.6*   Urine analysis:    Component Value Date/Time   COLORURINE YELLOW 06/01/2021 2051    APPEARANCEUR HAZY (A) 06/01/2021 2051   LABSPEC 1.012 06/01/2021 2051   PHURINE 7.0 06/01/2021 2051   GLUCOSEU NEGATIVE 06/01/2021 2051   HGBUR MODERATE (A) 06/01/2021 2051   BILIRUBINUR NEGATIVE 06/01/2021 2051   [place] NEGATIVE 06/01/2021 2051   PROTEINUR 30 (A) 06/01/2021 2051   NITRITE NEGATIVE 06/01/2021 2051   LEUKOCYTESUR SMALL (A) 06/01/2021 2051   Sepsis Labs: @LABRCNTIP (procalcitonin:4,lacticidven:4) ) Recent Results (from the past 240 hour(s))  Resp Panel by RT-PCR (Flu A&B, Covid) Nasopharyngeal Swab     Status: None   Collection Time: 10/09/21  9:52 PM   Specimen: Nasopharyngeal Swab; Nasopharyngeal(NP) swabs in vial transport medium  Result Value Ref Range Status   SARS Coronavirus 2 by RT PCR NEGATIVE NEGATIVE Final    Comment: (NOTE) SARS-CoV-2 target nucleic acids are NOT DETECTED.  The SARS-CoV-2 RNA is generally detectable in upper respiratory specimens during the acute phase of infection. The lowest concentration of SARS-CoV-2 viral copies this assay can detect is 138 copies/mL. A negative result does not preclude SARS-Cov-2 infection and should not be used as the sole basis for treatment or other patient management decisions. A negative result may occur with  improper specimen collection/handling, submission of specimen other than nasopharyngeal swab, presence of viral mutation(s) within the areas targeted by this assay, and inadequate number of viral copies(<138 copies/mL). A negative result must be combined with clinical observations, patient history, and epidemiological information. The expected result is Negative.  Fact Sheet for Patients:  EntrepreneurPulse.com.au  Fact Sheet for Healthcare Providers:  IncredibleEmployment.be  This test is no t yet approved or cleared by the [place] FDA and  has been authorized for detection and/or diagnosis of SARS-CoV-2 by FDA under an Emergency Use Authorization  (EUA). This EUA will remain  in effect (meaning this test can be used) for the duration of the COVID-19 declaration under Section 564(b)(1) of the Act, 21 U.S.C.section 360bbb-3(b)(1), unless the authorization is terminated  or revoked sooner.       Influenza A by PCR NEGATIVE  NEGATIVE Final   Influenza B by PCR NEGATIVE NEGATIVE Final    Comment: (NOTE) The Xpert Xpress SARS-CoV-2/FLU/RSV plus assay is intended as an aid in the diagnosis of influenza from Nasopharyngeal swab specimens and should not be used as a sole basis for treatment. Nasal washings and aspirates are unacceptable for Xpert Xpress SARS-CoV-2/FLU/RSV testing.  Fact Sheet for Patients: EntrepreneurPulse.com.au  Fact Sheet for Healthcare Providers: IncredibleEmployment.be  This test is not yet approved or cleared by the [place] FDA and has been authorized for detection and/or diagnosis of SARS-CoV-2 by FDA under an Emergency Use Authorization (EUA). This EUA will remain in effect (meaning this test can be used) for the duration of the COVID-19 declaration under Section 564(b)(1) of the Act, 21 U.S.C. section 360bbb-3(b)(1), unless the authorization is terminated or revoked.  Performed at [facility], [address] [place], [place] [address]      Radiological Exams on Admission: US PELVIS (TRANSABDOMINAL ONLY)  Result Date: 10/10/2021 CLINICAL DATA:  Pelvic pain, bleeding, uterine fibroids EXAM: TRANSABDOMINAL ULTRASOUND OF PELVIS TECHNIQUE: Transabdominal ultrasound examination of the pelvis was performed including evaluation of the uterus, ovaries, adnexal regions, and pelvic cul-de-sac. COMPARISON:  CT abdomen/pelvis dated 01/11/2021 FINDINGS: Uterus Measurements: 24.7 x 13.7 x 22.5 cm = volume: 3982 mL. Multiple uterine fibroids, including: --6.5 x 4.9 x 5.2 cm intramural fibroid in the right posterior uterine body --6.2 x 6.3 x 5.1 cm  intramural fibroid in the right posterior uterine fundus --4.1 x 3.1 x 4.4 cm subserosal fibroid in the left anterior uterine body Endometrium Thickness: 27 mm. Heterogeneous thickening with complex soft tissue/fluid in the endometrial cavity. Right ovary Not discretely visualized.  No adnexal mass is seen. Left ovary Not discretely visualized.  No adnexal mass is seen. Other findings:  No abnormal free fluid. IMPRESSION: Enlarged uterus with multiple uterine fibroids, measuring up to 6.5 cm, as above. Heterogeneous, thickened endometrium, measuring 2.7 cm. Endometrial sampling is suggested. Electronically Signed   By: Julian Hy M.D.   On: 10/10/2021 02:54   DG Chest Port 1 View  Result Date: 10/09/2021 CLINICAL DATA:  Anemia EXAM: PORTABLE CHEST 1 VIEW COMPARISON:  06/02/2021 FINDINGS: Lungs are clear.  No pleural effusion or pneumothorax. Skin fold along the right lateral hemithorax. Cardiomegaly. IMPRESSION: Cardiomegaly. No evidence of acute cardiopulmonary disease. Electronically Signed   By: Julian Hy M.D.   On: 10/09/2021 22:17    EKG: Independently reviewed.  Normal sinus rhythm.  Assessment/Plan Principal Problem:   Acute blood loss anemia Active Problems:   Menorrhagia   Uncontrolled hypertension   Hemorrhagic stroke [facility])   Uterine fibroid   Left hemiparesis (HCC)    Acute blood loss anemia secondary to continuous menorrhagia for which ER physician did discuss with on-call OB/GYN Dr. Roland Rack who advised getting pelvic ultrasound, to give tranexamic acid which has been ordered and transfusing PRBC.  Patient is getting 4 units of PRBC after which patient will need FFP and platelet transfusion.  Follow CBC.  Patient does have known history of fibroids. Fever with concern developing sepsis for which blood cultures were obtained and started on empiric antibiotics.  Follow UA and urine cultures. Uncontrolled hypertension presently on clonidine hydralazine coverage on  Imdur Entresto and as needed IV hydralazine. Prior history of CVA with left-sided hemiplegia. History of CHF presently receiving PRBC. History of SVT on amiodarone.  Since patient has severe anemia with ongoing menorrhagia and will need close monitoring for further work-up.  And inpatient status.  DVT prophylaxis: SCDs.  Avoiding anticoagulation in the setting of ongoing bleed. Code Status: No CPR. Family Communication: We will need to discuss with family. Disposition Plan: Back to facility when stable. Consults called: OB/GYN. Admission status: Inpatient.   Rise Patience MD [facility] Pager [phone].  If 7PM-7AM, please contact night-coverage [email or web address] Password [email or web address]  10/10/2021, 3:13 AM

## 2021-10-10 NOTE — TOC Initial Note (Signed)
Transition of Care [facility]) - Initial/Assessment Note    Patient Details  Name: Holly Bradley MRN: [id number] Date of Birth: [birth year]  Transition of Care [facility]) CM/SW Contact:    Leeroy Cha, RN Phone Number: 10/10/2021, 7:57 AM  Clinical Narrative:                 45 y.o. female with history of CVA with left-sided hemiplegia, SVT, CHF, chronic anemia, hypertension was brought to the ER after patient's hemoglobin was found to be low and patient also has been having continuous menorrhagia.   ED Course: In the ER patient was febrile with temperature 102 F tachycardic.  Patient's hemoglobin was found to be around 2.9.  ER physician did discuss with on-call OB/GYN Dr. Roland Rack who advised getting pelvic ultrasound transfusing and they will be seeing patient in consult.  Patient had blood cultures drawn chest x-ray was unremarkable COVID test was negative.  UA is pending.  Patient also was started on empiric antibiotics for possible developing sepsis.  TOC PLAN OF CARE: Following for rpogression and toc hhc needs   Expected Discharge Plan: Home/Self Care Barriers to Discharge: Continued Medical Work up   Patient Goals and CMS Choice Patient states their goals for this hospitalization and ongoing recovery are:: to go home and feel better CMS Medicare.gov Compare Post Acute Care list provided to:: Patient    Expected Discharge Plan and Services Expected Discharge Plan: Home/Self Care   Discharge Planning Services: CM Consult   Living arrangements for the past 2 months: Single Family Home                                      Prior Living Arrangements/Services Living arrangements for the past 2 months: Single Family Home Lives with:: Self Patient language and need for interpreter reviewed:: Yes Do you feel safe going back to the place where you live?: Yes            Criminal Activity/Legal Involvement Pertinent to Current Situation/Hospitalization: No - Comment  as needed  Activities of Daily Living      Permission Sought/Granted                  Emotional Assessment Appearance:: Appears stated age     Orientation: : Oriented to Self, Oriented to Place, Oriented to  Time, Oriented to Situation Alcohol / Substance Use: Not Applicable Psych Involvement: No (comment)  Admission diagnosis:  Acute blood loss anemia [D62] Symptomatic anemia [D64.9] Patient Active Problem List   Diagnosis Date Noted   Acute blood loss anemia 10/10/2021   Hypertension    Non-ischemic cardiomyopathy (HCC)    Swelling of left hand    Proctocolitis    Constipation    Uterine fibroid    Left hemiparesis (HCC)    Hemorrhagic stroke (HCC)    Abnormal uterine bleeding (AUB)    Staphylococcus epidermidis bacteremia    Iron deficiency anemia due to chronic blood loss 05/25/2021   Cerebral edema (HCC) 05/25/2021   Chronic HFrEF (heart failure with reduced ejection fraction) ([place]) 05/25/2021   Pneumonia due to methicillin susceptible Staphylococcus aureus ([place]) 05/25/2021   Acute respiratory failure with hypoxia ([place])    Acute right MCA stroke ([place]) 05/15/2021   Uncontrolled hypertension 05/15/2021   Cerebrovascular accident (CVA) ([place]) 05/15/2021   IDA (iron deficiency anemia) 03/01/2021   Healthcare maintenance 03/01/2021   Onychomycosis 03/01/2021   Food insecurity  02/02/2021   Psychosis ([place])    Fibroid 01/11/2021   Menorrhagia 01/11/2021   Acute exacerbation of CHF (congestive heart failure) ([place]) 01/10/2021   AKI (acute kidney injury) ([place])    SVT (supraventricular tachycardia) ([place]) 05/01/2020   CHF (congestive heart failure) ([place]) 05/01/2020   PCP:  Mitzi Hansen, MD Pharmacy:   [facility], [place] [address] [address] Phone: [phone] Fax: [phone]  [id number] - [place], [address] AT [address] [place] [address] Phone: [phone] Fax: [place] [address] [place] [address] Phone: [phone] Fax: [phone]     Social Determinants of Health (SDOH) Interventions    Readmission Risk Interventions No flowsheet data found.

## 2021-10-10 NOTE — Progress Notes (Signed)
Patient had not voided this admission; bladder scan showed 586 mLs. Dr Wyline Copas notified by this RN and In-and out cath performed per verbal order. 650 mLs of urine drained from bladder; ordered UA and culture sent.   Patient continues to pass large fibroid clots and drainage. Dr Wyline Copas aware. Hgb stable at this time, 11.1.This RN will continue to carefully monitor hgb.

## 2021-10-10 NOTE — Evaluation (Signed)
Clinical/Bedside Swallow Evaluation Patient Details  Name: Holly Bradley MRN: [id number] Date of Birth: [birth year]  Today's Date: 10/10/2021 Time: SLP Start Time (ACUTE ONLY): 1520 SLP Stop Time (ACUTE ONLY): 1600 SLP Time Calculation (min) (ACUTE ONLY): 40 min  Past Medical History:  Past Medical History:  Diagnosis Date   CHF (congestive heart failure) (HCC)    Chronic blood loss anemia    Related to uterine fibroids   Essential hypertension    Poorly controlled, repeat by report only on nicardipine 90 mg   Fibroid    Likely complicated by by menometrorrhagia and chronic anemia   Hypertension    Paroxysmal SVT (supraventricular tachycardia) (HCC)    Frequent episodes, can last anywhere from 20 minutes to 12-15 hours.  Not on beta-blocker or calcium channel blocker   Uterine fibroid    Past Surgical History:  Past Surgical History:  Procedure Laterality Date   IR ANGIOGRAM FOLLOW UP STUDY  05/15/2021   IR CT HEAD LTD  05/15/2021   IR PERCUTANEOUS ART THROMBECTOMY/INFUSION INTRACRANIAL INC DIAG ANGIO  05/15/2021       IR PERCUTANEOUS ART THROMBECTOMY/INFUSION INTRACRANIAL INC DIAG ANGIO  05/15/2021   IR TRANSCATH/EMBOLIZ  05/15/2021   RADIOLOGY WITH ANESTHESIA N/A 05/15/2021   Procedure: IR WITH ANESTHESIA;  Surgeon: Radiologist, Medication, MD;  Location: [facility];  Service: Radiology;  Laterality: N/A;   Unknown     HPI:  45 yo female adm to [facility] from [facility] with excessive blood loss due to fibroids per pt.  Pt with h/o CVA (04/2021) requiring intubation x2 - and having undergone thrombectomy, left-sided hemiplegia, dysphagia, SVT, CHF, chronic anemia, hypertensi.  Pt's hemoglobin was found to be low and patient also has been having continuous menorrhagia.  She has received a blood transfusion.  Pt reports she is taking nectar thick liquids and regular diet /ground meats prior to admit.  Swallow evaluation ordered. Pt has a provale up at [facility] but pt continues to report  coughing with 75% of boluses from cup therefore she does not drink think liquids.    Assessment / Plan / Recommendation  Clinical Impression  Patient tested with nectar, icecream and solids - She does have dysphagia and dysarthria from her prior 04/2021 stroke.   Hypoglossal, facial and trigeminal nerve deficits present causing slow rate of speech - pt also with geographical tongue.  She has self selected to stay on nectar thick liquids due to choking with thin liquids approx 75% of the time, even with a bolus flow control cup.  SLP assisted pt with self feeding using spoon *she uses foam buildups at SNF* and graham cracker, nectar liquids via straw (pt held).  No indications of aspiration nor retention.  She masticates slowly and clears fully *but admits sometimes she has left pocketing.  Per pt wishes, did not test thin liquids or ice *sensitive dentition*  - recommend dys3/nectar liquids with strict precautions.  Will follow up briefly to assure tolerating, to assess for further modificatino of diet or readiness for advancement.  SlP encouraged pt to consume adequate liquids for hydration needs.  Pills crushed - but if several medications - give in several small spoon amounts please.  SlP assisted pt to call her brother and leave message on his work phone to request call from him to pt.  Education [employer] teach back completed. SLP Visit Diagnosis: Dysphagia, oral phase (R13.11)    Aspiration Risk  Moderate aspiration risk    Diet Recommendation Dysphagia 3 (Mech soft);Nectar-thick liquid  Liquid Administration via: Straw Medication Administration: Crushed with puree Supervision: Full supervision/cueing for compensatory strategies (pt reports needing assist) Compensations: Slow rate;Small sips/bites;Lingual sweep for clearance of pocketing Postural Changes: Seated upright at 90 degrees;Remain upright for at least 30 minutes after po intake    Other  Recommendations Oral Care Recommendations: Oral  care BID    Recommendations for follow up therapy are one component of a multi-disciplinary discharge planning process, led by the attending physician.  Recommendations may be updated based on patient status, additional functional criteria and insurance authorization.  Follow up Recommendations        Frequency and Duration min 1 x/week  1 week       Prognosis    N/a    Swallow Study   General HPI: 45 yo female adm to [facility] from [facility] with excessive blood loss due to fibroids per pt.  Pt with h/o CVA (04/2021) requiring intubation x2 - and having undergone thrombectomy, left-sided hemiplegia, dysphagia, SVT, CHF, chronic anemia, hypertensi.  Pt's hemoglobin was found to be low and patient also has been having continuous menorrhagia.  She has received a blood transfusion.  Pt reports she is taking nectar thick liquids and regular diet /ground meats prior to admit.  Swallow evaluation ordered. Pt has a provale up at [facility] but pt continues to report coughing with 75% of boluses from cup therefore she does not drink think liquids. Type of Study: Bedside Swallow Evaluation Diet Prior to this Study: Regular;Thin liquids Respiratory Status: Room air History of Recent Intubation: No Behavior/Cognition: Alert;Cooperative;Pleasant mood Oral Cavity Assessment: Within Functional Limits Oral Care Completed by SLP: Yes (SLP assisted pt to brush her teeth) Oral Cavity - Dentition: Adequate natural dentition Vision: Functional for self-feeding Self-Feeding Abilities: Able to feed self Patient Positioning: Upright in bed Baseline Vocal Quality: Normal Volitional Cough: Strong Volitional Swallow: Able to elicit    Oral/Motor/Sensory Function Overall Oral Motor/Sensory Function: Other (comment) (decreased lingual movement - hypoglossal nerve, decr left lower facial sensation- trigeminal nerve and facial nerve involvement with decreased left facial motility)   Ice Chips Ice chips: Not  tested Other Comments: ice bothers pt's dentition   Thin Liquid Thin Liquid: Not tested Other Comments: pt did not take due to reported issues with frequent choking    Nectar Thick Nectar Thick Liquid: Within functional limits Presentation: Straw   Honey Thick Honey Thick Liquid: Not tested   Puree Puree: Within functional limits Presentation: Self Fed;Spoon   Solid     Solid: Impaired Presentation: Self Fed Oral Phase Impairments: Impaired mastication (prolonged mastication) Oral Phase Functional Implications: Other (comment) (no left lateral retention but pt does admit to occasional issues with oral retention of solids)      Macario Golds 10/10/2021,4:00 PM  Kathleen Lime, MS [place] Office [phone] Pager [phone]

## 2021-10-10 NOTE — Progress Notes (Signed)
Upon AM assessment, patient's abdomen was very taut/distended/tender. Pt reports constipation at baseline, and says she has not had a BM in 2 weeks. Dr Wyline Copas notified by this RN, and abd XR ordered, as well as soap suds enema. This RN instilled 800 cc soap suds solution, and pt had a type 7 BM in response. This RN will continue to assess for abd pain/discomfort.

## 2021-10-10 NOTE — ED Provider Notes (Addendum)
Physical Exam  BP 134/75 (BP Location: Right Arm)   Pulse 99   Temp 98.3 F (36.8 C) (Oral)   Resp 17   Ht 5\' 5"  (1.651 m)   Wt 68 kg   LMP  (LMP Unknown)   SpO2 90%   BMI 24.96 kg/m   Physical Exam  ED Course/Procedures   Clinical Course as of 10/10/21 0012  Tue Oct 09, 2021  2301 Collateral history obtained from the patient's brother, Alegra Rost by telephone.  He states patient's stroke this summer is what caused her to be placed in her rehab facility that she regularly states she does not like living there.  He also can confirm that the patient's abdomen is at her baseline secondary to her fibroids.  States that they were planning to have them removed immediately prior to her stroke; unfortunately because of her CVA this was not performed.  I appreciate his collaboration in the care of this patient. [RS]  2335 Informed by RN that Hbg was 3. STAT repeat CBC, istat chem 8, and type and screen were ordered.  [RS]  Wed Oct 10, 2021  0004 Consult to Dr. Elly Modena, GYN on call, who recommends megace 40 mg BID, pelvic US. She also requests reconsult to OB/GYN in the morning so she can be added to her list for grand rounds. Patient not a candidate for emergent procedure.  I appreciate her collaboration in the care of this patient.  [RS]    Clinical Course User Index [RS] Sponseller, Rebekah R, PA-C    .Critical Care Performed by: Varney Biles, MD Authorized by: Varney Biles, MD   Critical care provider statement:    Critical care time (minutes):  45   Critical care time was exclusive of:  Separately billable procedures and treating other patients   Critical care was necessary to treat or prevent imminent or life-threatening deterioration of the following conditions:  Circulatory failure   Critical care was time spent personally by me on the following activities:  Development of treatment plan with patient or surrogate, discussions with consultants, examination of patient,  interpretation of cardiac output measurements, obtaining history from patient or surrogate, ordering and review of laboratory studies, ordering and review of radiographic studies, pulse oximetry, re-evaluation of patient's condition, transcutaneous pacing, review of old charts and vascular access procedures  MDM   45 year old female comes in with chief complaint of abnormal labs.  Patient has a hemoglobin of 3.  She reports that she can slowly bleeding over the course of the last several months.  She had blood work at her facility where she is noted to have anemia and sent to the ER.  Patient is hemodynamically stable.  She had a fever of 102, unknown source at this time.  Code sepsis activated just to be on the safe side given how critically ill she is.  For the anemia, 2 units of emergency release blood ordered along with 4 units of crossmatched blood.  She is having active bleeding, but its not massive.   I looked into massive transfusion protocol, however I do not suspect that she will need 10 units of PRBC in 24 hours.  I have called the blood bank and have her FFP on hold.  We are giving her TXA. Fibrinogen and DIC panel also sent along with anemia panel.  I will discuss the case with Dr. Hal Hope as well.  Ensure that he considers giving patient Lasix and FFP and platelet, calcium etc.  Varney Biles, MD 10/10/21 3734    Varney Biles, MD 10/16/21 1051

## 2021-10-11 DIAGNOSIS — R140 Abdominal distension (gaseous): Secondary | ICD-10-CM

## 2021-10-11 LAB — BPAM FFP
Blood Product Expiration Date: 202210242359
ISSUE DATE / TIME: 202210190818
Unit Type and Rh: 5100

## 2021-10-11 LAB — CBC
HCT: 26.5 % — ABNORMAL LOW (ref 36.0–46.0)
Hemoglobin: 8.1 g/dL — ABNORMAL LOW (ref 12.0–15.0)
MCH: 26.6 pg (ref 26.0–34.0)
MCHC: 30.6 g/dL (ref 30.0–36.0)
MCV: 86.9 fL (ref 80.0–100.0)
Platelets: 247 10*3/uL (ref 150–400)
RBC: 3.05 MIL/uL — ABNORMAL LOW (ref 3.87–5.11)
RDW: 17.7 % — ABNORMAL HIGH (ref 11.5–15.5)
WBC: 6 10*3/uL (ref 4.0–10.5)
nRBC: 0.8 % — ABNORMAL HIGH (ref 0.0–0.2)

## 2021-10-11 LAB — COMPREHENSIVE METABOLIC PANEL
ALT: 8 U/L (ref 0–44)
AST: 11 U/L — ABNORMAL LOW (ref 15–41)
Albumin: 2.9 g/dL — ABNORMAL LOW (ref 3.5–5.0)
Alkaline Phosphatase: 90 U/L (ref 38–126)
Anion gap: 6 (ref 5–15)
BUN: 17 mg/dL (ref 6–20)
CO2: 22 mmol/L (ref 22–32)
Calcium: 9.1 mg/dL (ref 8.9–10.3)
Chloride: 110 mmol/L (ref 98–111)
Creatinine, Ser: 0.84 mg/dL (ref 0.44–1.00)
GFR, Estimated: >60 mL/min (ref >60)
Glucose, Bld: 99 mg/dL (ref 70–99)
Potassium: 3.8 mmol/L (ref 3.5–5.1)
Sodium: 138 mmol/L (ref 135–145)
Total Bilirubin: 1 mg/dL (ref 0.3–1.2)
Total Protein: 6.1 g/dL — ABNORMAL LOW (ref 6.5–8.1)

## 2021-10-11 LAB — BLOOD CULTURE ID PANEL (REFLEXED) - BCID2

## 2021-10-11 LAB — URINE CULTURE: Culture: NO GROWTH

## 2021-10-11 LAB — PREPARE FRESH FROZEN PLASMA

## 2021-10-11 LAB — HEMOGLOBIN AND HEMATOCRIT, BLOOD
HCT: 28.7 % — ABNORMAL LOW (ref 36.0–46.0)
Hemoglobin: 9.1 g/dL — ABNORMAL LOW (ref 12.0–15.0)

## 2021-10-11 MED ORDER — SODIUM CHLORIDE 0.9 % IV SOLN: INTRAVENOUS | Status: DC | PRN

## 2021-10-11 MED ORDER — SODIUM CHLORIDE 0.9 % IV SOLN
2.0000 g | Freq: Three times a day (TID) | INTRAVENOUS | Status: DC
  Administered 2021-10-11 – 2021-10-12 (×5): 2 g via INTRAVENOUS
  Filled 2021-10-11 (×5): qty 2

## 2021-10-11 NOTE — Progress Notes (Signed)
Pharmacy Antibiotic Note  Holly Bradley is a 45 y.o. female admitted on 10/09/2021 with  Hgb of 3, fever of unknown source.  Pharmacy has been consulted to dose vancomycin and cefepime for sepsis on 10/19.  1st doses given in ED  Plan: Renal function has improved - change cefepime from 2g IV q12h to 2g IV q8 Continue current vanc dosing - 1250mg  IV q24 - goal AUC 400-550 Follow renal function, cultures and clinical course  Height: 5\' 5"  (165.1 cm) Weight: 57.5 kg (126 lb 12.2 oz) IBW/kg (Calculated) : 57  Temp (24hrs), Avg:98 F (36.7 C), Min:97.7 F (36.5 C), Max:98.5 F (36.9 C)  Recent Labs  Lab 10/09/21 2241 10/09/21 2258 10/09/21 2259 10/10/21 1244 10/11/21 0229  WBC 9.1 9.2  --  9.0 6.0  CREATININE 1.14*  --   --   --  0.84  LATICACIDVEN 0.6  --  0.6  --   --      Estimated Creatinine Clearance: 74.5 mL/min (by C-G formula based on SCr of 0.84 mg/dL).    No Known Allergies  Antimicrobials this admission: 10/19 vanc >> 10/19 cefepime >> 10819 flagyl x 1  Dose adjustments this admission:   Microbiology results: 10/18 BCx: 1/4 bottles with staph epi - presumed contaminant at this point 10/18 UCx:  sent 10/19 MRSA PCR: negative  Thank you for allowing pharmacy to be a part of this patient's care.  Adrian Saran, PharmD, BCPS Secure Chat if ?s 10/11/2021 10:17 AM

## 2021-10-11 NOTE — Progress Notes (Signed)
PHARMACY - PHYSICIAN COMMUNICATION CRITICAL VALUE ALERT - BLOOD CULTURE IDENTIFICATION (BCID)  Holly Bradley is an 45 y.o. female who presented to [facility] on 10/09/2021 with a chief complaint of low hgb  Assessment:  GPC , 1/4 staph species  Name of physician (or Provider) Contacted: Clarene Essex  Current antibiotics: vancomycin and cefepime  Changes to prescribed antibiotics recommended:  Patient is on recommended antibiotics - No changes needed  Results for orders placed or performed during the hospital encounter of 05/15/21  Blood Culture ID Panel (Reflexed) (Collected: 05/21/2021 11:09 AM)  Result Value Ref Range   Enterococcus faecalis NOT DETECTED NOT DETECTED   Enterococcus Faecium NOT DETECTED NOT DETECTED   Listeria monocytogenes NOT DETECTED NOT DETECTED   Staphylococcus species DETECTED (A) NOT DETECTED   Staphylococcus aureus (BCID) NOT DETECTED NOT DETECTED   Staphylococcus epidermidis DETECTED (A) NOT DETECTED   Staphylococcus lugdunensis NOT DETECTED NOT DETECTED   Streptococcus species NOT DETECTED NOT DETECTED   Streptococcus agalactiae NOT DETECTED NOT DETECTED   Streptococcus pneumoniae NOT DETECTED NOT DETECTED   Streptococcus pyogenes NOT DETECTED NOT DETECTED   A.calcoaceticus-baumannii NOT DETECTED NOT DETECTED   Bacteroides fragilis NOT DETECTED NOT DETECTED   Enterobacterales NOT DETECTED NOT DETECTED   Enterobacter cloacae complex NOT DETECTED NOT DETECTED   Escherichia coli NOT DETECTED NOT DETECTED   Klebsiella aerogenes NOT DETECTED NOT DETECTED   Klebsiella oxytoca NOT DETECTED NOT DETECTED   Klebsiella pneumoniae NOT DETECTED NOT DETECTED   Proteus species NOT DETECTED NOT DETECTED   Salmonella species NOT DETECTED NOT DETECTED   Serratia marcescens NOT DETECTED NOT DETECTED   Haemophilus influenzae NOT DETECTED NOT DETECTED   Neisseria meningitidis NOT DETECTED NOT DETECTED   Pseudomonas aeruginosa NOT DETECTED NOT DETECTED    Stenotrophomonas maltophilia NOT DETECTED NOT DETECTED   Candida albicans NOT DETECTED NOT DETECTED   Candida auris NOT DETECTED NOT DETECTED   Candida glabrata NOT DETECTED NOT DETECTED   Candida krusei NOT DETECTED NOT DETECTED   Candida parapsilosis NOT DETECTED NOT DETECTED   Candida tropicalis NOT DETECTED NOT DETECTED   Cryptococcus neoformans/gattii NOT DETECTED NOT DETECTED   Methicillin resistance mecA/C DETECTED (A) NOT DETECTED    Dolly Rias RPh 10/11/2021, 2:01 AM

## 2021-10-11 NOTE — Progress Notes (Signed)
PROGRESS NOTE    Holly Bradley  OAC:[id number] DOB: 09/05/1976 DOA: 10/09/2021 PCP: Mitzi Hansen, MD    Brief Narrative:  45 y.o. female with history of CVA with hx of fibroids, left-sided hemiplegia, SVT, CHF, chronic anemia, hypertension was brought to the ER after patient's hemoglobin was found to be low and patient also has been having continuous menorrhagia.  Assessment & Plan:   Principal Problem:   Acute blood loss anemia Active Problems:   Menorrhagia   Uncontrolled hypertension   Hemorrhagic stroke (HCC)   Uterine fibroid   Left hemiparesis (HCC)   Acute blood loss anemia secondary to continuous menorrhagia Pt is now s/p tranexamic acid in ED with 5 units PRBC's being transfused, in addition to FFP EDP discussed with Ob/Gyn who recommended continuing on megace Appreciate input by Gyn. Difficult situation where pt is deemed not a surgical candidate given co morbidities. Gyn recs for either Mirena IUD vs [place] if rebleeding  Clots noted per RN with hgb remaining stable, if not rising from this AM, up to over 9 Eliquis remains on hold, see below Fever with concern developing sepsis for which blood cultures were obtained and started on empiric antibiotics. UA notable only for blood, correlating to presenting menorrhagia CXR unremarkable F/u on blood cx. Low threshold to hold further abx if no source is identified Medically stable at this time Uncontrolled hypertension Was continued on clonidine hydralazine coverage on Imdur Entresto and as needed IV hydralazine. Prior history of CVA with left-sided hemiplegia. Chart reviewed. Very difficult situation with concerns for underlying hypercoag state Pt was previously on ASA, which she tolerated. Found to be hypercoagulable with Neurology later prescribing eliquis prior to onset of above menorrhagia Will reach out to Hematology regarding concerns of hypercoagulable state in a pt prone to significant uterine bleeding History  of CHF  Tolerated recent PRBC transfusion History of SVT on amiodarone. Hemodynamically stable   DVT prophylaxis: SCD's Code Status: No CPR Family Communication: Pt in room, family at bedside  Status is: Inpatient  Remains inpatient appropriate because: acute illness needing PRBC transfusion   Consultants:  Ob/Gyn Hematology  Procedures:    Antimicrobials: Anti-infectives (From admission, onward)    Start     Dose/Rate Route Frequency Ordered Stop   10/11/21 1200  ceFEPIme (MAXIPIME) 2 g in sodium chloride 0.9 % 100 mL IVPB        2 g 200 mL/hr over 30 Minutes Intravenous Every 8 hours 10/11/21 1018     10/10/21 2200  vancomycin (VANCOREADY) IVPB 1250 mg/250 mL        1,250 mg 166.7 mL/hr over 90 Minutes Intravenous Every 24 hours 10/10/21 0402     10/10/21 1200  ceFEPIme (MAXIPIME) 2 g in sodium chloride 0.9 % 100 mL IVPB  Status:  Discontinued        2 g 200 mL/hr over 30 Minutes Intravenous Every 12 hours 10/10/21 0402 10/11/21 1018   10/09/21 2345  ceFEPIme (MAXIPIME) 2 g in sodium chloride 0.9 % 100 mL IVPB        2 g 200 mL/hr over 30 Minutes Intravenous  Once 10/09/21 2343 10/10/21 0046   10/09/21 2345  metroNIDAZOLE (FLAGYL) IVPB 500 mg        500 mg 100 mL/hr over 60 Minutes Intravenous  Once 10/09/21 2343 10/10/21 0135   10/09/21 2345  vancomycin (VANCOCIN) IVPB 1000 mg/200 mL premix        1,000 mg 200 mL/hr over 60 Minutes Intravenous  Once 10/09/21  2343 10/10/21 0135       Subjective: Without complaints this AM  Objective: Vitals:   10/11/21 1400 10/11/21 1500 10/11/21 1506 10/11/21 1600  BP: 132/66 (!) 162/71 (!) 162/71 (!) 176/73  Pulse: 70 67  69  Resp: 15 18  19   Temp:      TempSrc:      SpO2: 100% 100%  100%  Weight:      Height:        Intake/Output Summary (Last 24 hours) at 10/11/2021 1655 Last data filed at 10/11/2021 1600 Gross per 24 hour  Intake 2086.97 ml  Output --  Net 2086.97 ml    Filed Weights   10/09/21 2215  10/10/21 0200  Weight: 68 kg 57.5 kg    Examination: General exam: Conversant, in no acute distress Respiratory system: normal chest rise, clear, no audible wheezing Cardiovascular system: regular rhythm, s1-s2 Gastrointestinal system: Nondistended, nontender, pos BS Central nervous system: No seizures, no tremors Extremities: No cyanosis, no joint deformities Skin: No rashes, no pallor Psychiatry: Affect normal // no auditory hallucinations   Data Reviewed: I have personally reviewed following labs and imaging studies  CBC: Recent Labs  Lab 10/09/21 2241 10/09/21 2258 10/10/21 0003 10/10/21 0018 10/10/21 1244 10/11/21 0229 10/11/21 1349  WBC 9.1 9.2  --   --  9.0 6.0  --   NEUTROABS 7.4 7.7  --   --   --   --   --   HGB 3.0* 2.9* 3.1*  --  11.1* 8.1* 9.1*  HCT 12.0* 11.1* 11.7*  --  35.1* 26.5* 28.7*  MCV 84.5 80.4  --   --  87.3 86.9  --   PLT 336 336  --  331 332 247  --     Basic Metabolic Panel: Recent Labs  Lab 10/09/21 2241 10/11/21 0229  NA 140 138  K 3.9 3.8  CL 111 110  CO2 20* 22  GLUCOSE 98 99  BUN 20 17  CREATININE 1.14* 0.84  CALCIUM 9.3 9.1    GFR: Estimated Creatinine Clearance: 74.5 mL/min (by C-G formula based on SCr of 0.84 mg/dL). Liver Function Tests: Recent Labs  Lab 10/09/21 2241 10/11/21 0229  AST 17 11*  ALT 10 8  ALKPHOS 104 90  BILITOT 0.6 1.0  PROT 7.0 6.1*  ALBUMIN 3.3* 2.9*    No results for input(s): LIPASE, AMYLASE in the last 168 hours. No results for input(s): AMMONIA in the last 168 hours. Coagulation Profile: Recent Labs  Lab 10/09/21 2300 10/10/21 0018 10/10/21 1244  INR 1.7* 1.7* 1.3*    Cardiac Enzymes: No results for input(s): CKTOTAL, CKMB, CKMBINDEX, TROPONINI in the last 168 hours. BNP (last 3 results) No results for input(s): PROBNP in the last 8760 hours. HbA1C: No results for input(s): HGBA1C in the last 72 hours. CBG: Recent Labs  Lab 10/10/21 0144  GLUCAP 113*    Lipid Profile: No  results for input(s): CHOL, HDL, LDLCALC, TRIG, CHOLHDL, LDLDIRECT in the last 72 hours. Thyroid Function Tests: No results for input(s): TSH, T4TOTAL, FREET4, T3FREE, THYROIDAB in the last 72 hours. Anemia Panel: Recent Labs    10/10/21 0018  VITAMINB12 1,206*  FOLATE 11.3  FERRITIN 19  TIBC 302  IRON 8*  RETICCTPCT 5.6*    Sepsis Labs: Recent Labs  Lab 10/09/21 2241 10/09/21 2259  LATICACIDVEN 0.6 0.6     Recent Results (from the past 240 hour(s))  Resp Panel by RT-PCR (Flu A&B, Covid) Nasopharyngeal Swab  Status: None   Collection Time: 10/09/21  9:52 PM   Specimen: Nasopharyngeal Swab; Nasopharyngeal(NP) swabs in vial transport medium  Result Value Ref Range Status   SARS Coronavirus 2 by RT PCR NEGATIVE NEGATIVE Final    Comment: (NOTE) SARS-CoV-2 target nucleic acids are NOT DETECTED.  The SARS-CoV-2 RNA is generally detectable in upper respiratory specimens during the acute phase of infection. The lowest concentration of SARS-CoV-2 viral copies this assay can detect is 138 copies/mL. A negative result does not preclude SARS-Cov-2 infection and should not be used as the sole basis for treatment or other patient management decisions. A negative result may occur with  improper specimen collection/handling, submission of specimen other than nasopharyngeal swab, presence of viral mutation(s) within the areas targeted by this assay, and inadequate number of viral copies(<138 copies/mL). A negative result must be combined with clinical observations, patient history, and epidemiological information. The expected result is Negative.  Fact Sheet for Patients:  EntrepreneurPulse.com.au  Fact Sheet for Healthcare Providers:  IncredibleEmployment.be  This test is no t yet approved or cleared by the [place] FDA and  has been authorized for detection and/or diagnosis of SARS-CoV-2 by FDA under an Emergency Use Authorization  (EUA). This EUA will remain  in effect (meaning this test can be used) for the duration of the COVID-19 declaration under Section 564(b)(1) of the Act, 21 U.S.C.section 360bbb-3(b)(1), unless the authorization is terminated  or revoked sooner.       Influenza A by PCR NEGATIVE NEGATIVE Final   Influenza B by PCR NEGATIVE NEGATIVE Final    Comment: (NOTE) The Xpert Xpress SARS-CoV-2/FLU/RSV plus assay is intended as an aid in the diagnosis of influenza from Nasopharyngeal swab specimens and should not be used as a sole basis for treatment. Nasal washings and aspirates are unacceptable for Xpert Xpress SARS-CoV-2/FLU/RSV testing.  Fact Sheet for Patients: EntrepreneurPulse.com.au  Fact Sheet for Healthcare Providers: IncredibleEmployment.be  This test is not yet approved or cleared by the [place] FDA and has been authorized for detection and/or diagnosis of SARS-CoV-2 by FDA under an Emergency Use Authorization (EUA). This EUA will remain in effect (meaning this test can be used) for the duration of the COVID-19 declaration under Section 564(b)(1) of the Act, 21 U.S.C. section 360bbb-3(b)(1), unless the authorization is terminated or revoked.  Performed at [facility], [place] [address] [place], [place] [address]   Blood Culture (routine x 2)     Status: None (Preliminary result)   Collection Time: 10/09/21 10:27 PM   Specimen: BLOOD  Result Value Ref Range Status   Specimen Description   Final    BLOOD BLOOD RIGHT HAND Performed at [place] [facility] [address] [place], [place] [address]    Special Requests   Final    BOTTLES DRAWN AEROBIC AND ANAEROBIC Blood Culture adequate volume Performed at [place] [address], [place], [place] [address]    Culture  Setup Time   Final    GRAM POSITIVE COCCI ANAEROBIC BOTTLE ONLY CRITICAL RESULT CALLED TO, READ BACK BY AND  VERIFIED WITH: PHARMD ELLEN JACKSON 10/11/21@1 :56 BY TW    Culture   Final    GRAM POSITIVE COCCI TOO YOUNG TO READ Performed at [facility], [place] [address], [place],  [address]    Report Status PENDING  Incomplete  Blood Culture ID Panel (Reflexed)     Status: Abnormal   Collection Time: 10/09/21 10:27 PM  Result Value Ref  Range Status   Enterococcus faecalis NOT DETECTED NOT DETECTED Final   Enterococcus Faecium NOT DETECTED NOT DETECTED Final   Listeria monocytogenes NOT DETECTED NOT DETECTED Final   Staphylococcus species DETECTED (A) NOT DETECTED Final    Comment: CRITICAL RESULT CALLED TO, READ BACK BY AND VERIFIED WITH: PHARMD ELLEN JACKSON 10/11/21@1 :56 BY TW    Staphylococcus aureus (BCID) NOT DETECTED NOT DETECTED Final   Staphylococcus epidermidis NOT DETECTED NOT DETECTED Final   Staphylococcus lugdunensis NOT DETECTED NOT DETECTED Final   Streptococcus species NOT DETECTED NOT DETECTED Final   Streptococcus agalactiae NOT DETECTED NOT DETECTED Final   Streptococcus pneumoniae NOT DETECTED NOT DETECTED Final   Streptococcus pyogenes NOT DETECTED NOT DETECTED Final   A.calcoaceticus-baumannii NOT DETECTED NOT DETECTED Final   Bacteroides fragilis NOT DETECTED NOT DETECTED Final   Enterobacterales NOT DETECTED NOT DETECTED Final   Enterobacter cloacae complex NOT DETECTED NOT DETECTED Final   Escherichia coli NOT DETECTED NOT DETECTED Final   Klebsiella aerogenes NOT DETECTED NOT DETECTED Final   Klebsiella oxytoca NOT DETECTED NOT DETECTED Final   Klebsiella pneumoniae NOT DETECTED NOT DETECTED Final   Proteus species NOT DETECTED NOT DETECTED Final   Salmonella species NOT DETECTED NOT DETECTED Final   Serratia marcescens NOT DETECTED NOT DETECTED Final   Haemophilus influenzae NOT DETECTED NOT DETECTED Final   Neisseria meningitidis NOT DETECTED NOT DETECTED Final   Pseudomonas aeruginosa NOT DETECTED NOT DETECTED Final   Stenotrophomonas maltophilia  NOT DETECTED NOT DETECTED Final   Candida albicans NOT DETECTED NOT DETECTED Final   Candida auris NOT DETECTED NOT DETECTED Final   Candida glabrata NOT DETECTED NOT DETECTED Final   Candida krusei NOT DETECTED NOT DETECTED Final   Candida parapsilosis NOT DETECTED NOT DETECTED Final   Candida tropicalis NOT DETECTED NOT DETECTED Final   Cryptococcus neoformans/gattii NOT DETECTED NOT DETECTED Final    Comment: Performed at [facility], [address], [place], [place] [address]  Blood Culture (routine x 2)     Status: None (Preliminary result)   Collection Time: 10/09/21 10:59 PM   Specimen: BLOOD  Result Value Ref Range Status   Specimen Description   Final    BLOOD BLOOD RIGHT HAND Performed at [place] [address], [place], [place] [address]    Special Requests   Final    BOTTLES DRAWN AEROBIC AND ANAEROBIC Blood Culture results may not be optimal due to an excessive volume of blood received in culture bottles Performed at [place] [address], [place], [place] [address]    Culture   Final    NO GROWTH 1 DAY Performed at [facility], [place] [address] [place], [place] [address]    Report Status PENDING  Incomplete  MRSA Next Gen by PCR, Nasal     Status: None   Collection Time: 10/10/21  2:02 PM  Result Value Ref Range Status   MRSA by PCR Next Gen NOT DETECTED NOT DETECTED Final    Comment: (NOTE) The GeneXpert MRSA Assay (FDA approved for NASAL specimens only), is one component of a comprehensive MRSA colonization surveillance program. It is not intended to diagnose MRSA infection nor to guide or monitor treatment for MRSA infections. Test performance is not FDA approved in patients less than 55 years old. Performed at [facility], [place] [address] [place], [place] [address]   Urine Culture     Status: None   Collection Time: 10/10/21  2:33 PM   Specimen: In/Out Cath Urine  Result  Value Ref Range Status   Specimen Description   Final    IN/OUT CATH URINE Performed at [facility], [place] [address], [place], [place] [address]    Special Requests   Final    NONE Performed at [facility], [place] [address] [place], [name] [address]    Culture   Final    NO GROWTH Performed at [facility], [place] [address] [place], [place] [address]    Report Status 10/11/2021 FINAL  Final      Radiology Studies: DG Abd 1 View  Result Date: 10/10/2021 CLINICAL DATA:  Abdominal distension EXAM: ABDOMEN - 1 VIEW COMPARISON:  None. FINDINGS: There is no evidence of bowel obstruction. Mass effect from known pelvic mass. There are no radiopaque calculi overlying the kidneys or course of the ureters. No acute osseous abnormality. IMPRESSION: No evidence of bowel obstruction. Electronically Signed   By: Maurine Simmering M.D.   On: 10/10/2021 14:31   US PELVIS (TRANSABDOMINAL ONLY)  Result Date: 10/10/2021 CLINICAL DATA:  Pelvic pain, bleeding, uterine fibroids EXAM: TRANSABDOMINAL ULTRASOUND OF PELVIS TECHNIQUE: Transabdominal ultrasound examination of the pelvis was performed including evaluation of the uterus, ovaries, adnexal regions, and pelvic cul-de-sac. COMPARISON:  CT abdomen/pelvis dated 01/11/2021 FINDINGS: Uterus Measurements: 24.7 x 13.7 x 22.5 cm = volume: 3982 mL. Multiple uterine fibroids, including: --6.5 x 4.9 x 5.2 cm intramural fibroid in the right posterior uterine body --6.2 x 6.3 x 5.1 cm intramural fibroid in the right posterior uterine fundus --4.1 x 3.1 x 4.4 cm subserosal fibroid in the left anterior uterine body Endometrium Thickness: 27 mm. Heterogeneous thickening with complex soft tissue/fluid in the endometrial cavity. Right ovary Not discretely visualized.  No adnexal mass is seen. Left ovary Not discretely visualized.  No adnexal mass is seen. Other findings:  No abnormal free fluid. IMPRESSION: Enlarged uterus with multiple  uterine fibroids, measuring up to 6.5 cm, as above. Heterogeneous, thickened endometrium, measuring 2.7 cm. Endometrial sampling is suggested. Electronically Signed   By: Julian Hy M.D.   On: 10/10/2021 02:54   DG Chest Port 1 View  Result Date: 10/09/2021 CLINICAL DATA:  Anemia EXAM: PORTABLE CHEST 1 VIEW COMPARISON:  06/02/2021 FINDINGS: Lungs are clear.  No pleural effusion or pneumothorax. Skin fold along the right lateral hemithorax. Cardiomegaly. IMPRESSION: Cardiomegaly. No evidence of acute cardiopulmonary disease. Electronically Signed   By: Julian Hy M.D.   On: 10/09/2021 22:17    Scheduled Meds:  amiodarone  200 mg Oral Daily   atorvastatin  40 mg Oral Daily   baclofen  20 mg Oral TID   carvedilol  25 mg Oral BID WC   Chlorhexidine Gluconate Cloth  6 each Topical Daily   cloNIDine  0.1 mg Oral TID   feeding supplement  237 mL Oral TID BM   hydrALAZINE  100 mg Oral TID   isosorbide mononitrate  60 mg Oral Daily   mouth rinse  15 mL Mouth Rinse BID   megestrol  40 mg Oral BID   nystatin  5 mL Oral QID   sacubitril-valsartan  1 tablet Oral BID   Continuous Infusions:  ceFEPime (MAXIPIME) IV Stopped (10/11/21 1147)   vancomycin Stopped (10/10/21 2237)     LOS: 1 day   Marylu Lund, MD [facility] Pager On Amion  If 7PM-7AM, please contact night-coverage 10/11/2021, 4:55 PM

## 2021-10-12 ENCOUNTER — Inpatient Hospital Stay (HOSPITAL_COMMUNITY): Payer: Medicaid - Out of State

## 2021-10-12 ENCOUNTER — Encounter (HOSPITAL_COMMUNITY): Payer: Self-pay | Admitting: Internal Medicine

## 2021-10-12 DIAGNOSIS — R609 Edema, unspecified: Secondary | ICD-10-CM | POA: Diagnosis not present

## 2021-10-12 DIAGNOSIS — I82409 Acute embolism and thrombosis of unspecified deep veins of unspecified lower extremity: Secondary | ICD-10-CM

## 2021-10-12 DIAGNOSIS — N921 Excessive and frequent menstruation with irregular cycle: Secondary | ICD-10-CM | POA: Diagnosis not present

## 2021-10-12 DIAGNOSIS — R760 Raised antibody titer: Secondary | ICD-10-CM

## 2021-10-12 DIAGNOSIS — Z7189 Other specified counseling: Secondary | ICD-10-CM

## 2021-10-12 DIAGNOSIS — Z515 Encounter for palliative care: Secondary | ICD-10-CM

## 2021-10-12 DIAGNOSIS — R140 Abdominal distension (gaseous): Secondary | ICD-10-CM | POA: Diagnosis not present

## 2021-10-12 DIAGNOSIS — I82422 Acute embolism and thrombosis of left iliac vein: Secondary | ICD-10-CM

## 2021-10-12 DIAGNOSIS — G8194 Hemiplegia, unspecified affecting left nondominant side: Secondary | ICD-10-CM | POA: Diagnosis not present

## 2021-10-12 LAB — CBC
HCT: 32.8 % — ABNORMAL LOW (ref 36.0–46.0)
Hemoglobin: 9.9 g/dL — ABNORMAL LOW (ref 12.0–15.0)
MCH: 26.4 pg (ref 26.0–34.0)
MCHC: 30.2 g/dL (ref 30.0–36.0)
MCV: 87.5 fL (ref 80.0–100.0)
Platelets: 247 10*3/uL (ref 150–400)
RBC: 3.75 MIL/uL — ABNORMAL LOW (ref 3.87–5.11)
RDW: 18.4 % — ABNORMAL HIGH (ref 11.5–15.5)
WBC: 8.2 10*3/uL (ref 4.0–10.5)
nRBC: 0 % (ref 0.0–0.2)

## 2021-10-12 LAB — CULTURE, BLOOD (ROUTINE X 2): Special Requests: ADEQUATE

## 2021-10-12 LAB — COMPREHENSIVE METABOLIC PANEL
ALT: 11 U/L (ref 0–44)
AST: 25 U/L (ref 15–41)
Albumin: 3 g/dL — ABNORMAL LOW (ref 3.5–5.0)
Alkaline Phosphatase: 105 U/L (ref 38–126)
Anion gap: 7 (ref 5–15)
BUN: 19 mg/dL (ref 6–20)
CO2: 21 mmol/L — ABNORMAL LOW (ref 22–32)
Calcium: 9.4 mg/dL (ref 8.9–10.3)
Chloride: 109 mmol/L (ref 98–111)
Creatinine, Ser: 0.77 mg/dL (ref 0.44–1.00)
GFR, Estimated: >60 mL/min (ref >60)
Glucose, Bld: 95 mg/dL (ref 70–99)
Potassium: 4.3 mmol/L (ref 3.5–5.1)
Sodium: 137 mmol/L (ref 135–145)
Total Bilirubin: 0.8 mg/dL (ref 0.3–1.2)
Total Protein: 7 g/dL (ref 6.5–8.1)

## 2021-10-12 IMAGING — CT CT CTA ABD/PEL W/CM AND/OR W/O CM
3 of 12 series · 11 of 46 positions shown, 16 images · IV contrast (APPLIED)
Comparison: CT abdomen/pelvis [DATE]

CLINICAL DATA: Vaginal bleeding, history of uterine fibroids.

EXAM:
CTA ABDOMEN AND PELVIS WITHOUT AND WITH CONTRAST
TECHNIQUE: Multidetector CT imaging of the abdomen and pelvis was performed
using the standard protocol during bolus administration of
intravenous contrast. Multiplanar reconstructed images and MIPs were
obtained and reviewed to evaluate the vascular anatomy.
CONTRAST:  100mL OMNIPAQUE IOHEXOL 350 MG/ML SOLN

[Series 6: arterial · axial · arterial · 0.86mm/px · z∈[-875,-607]mm · 6 of 231 slices shown]
[im 20/231  soft-tissue]
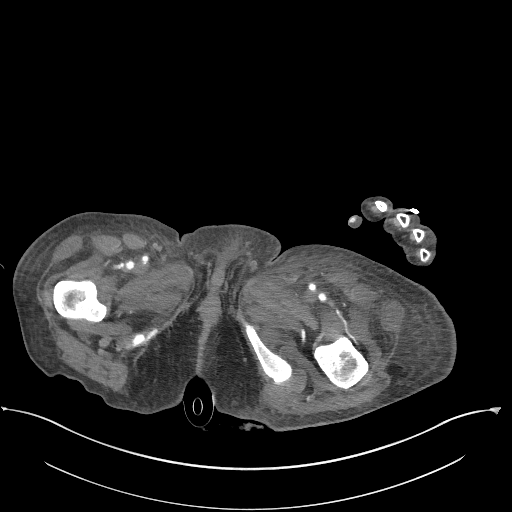
[im 58/231  soft-tissue]
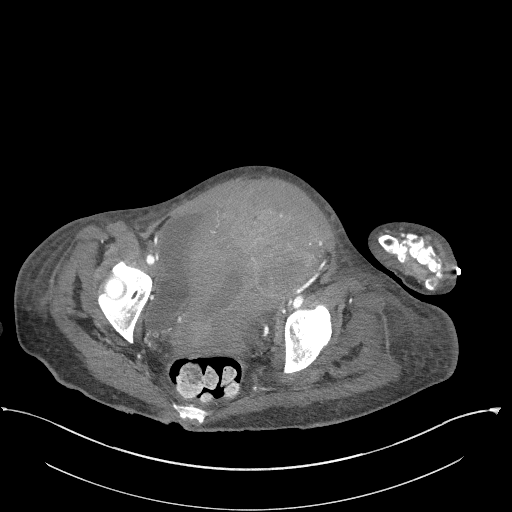
[im 77/231  soft-tissue]
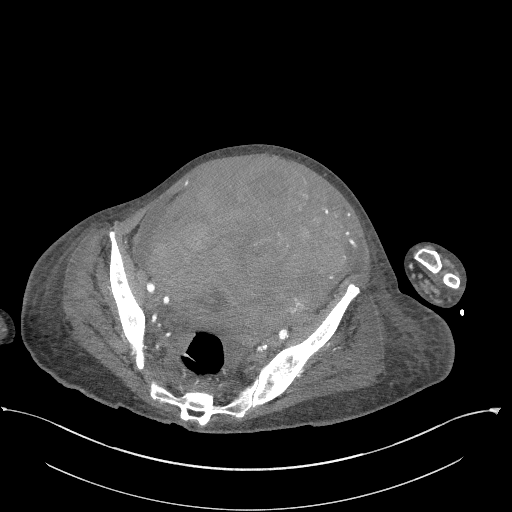
[im 96/231  soft-tissue]
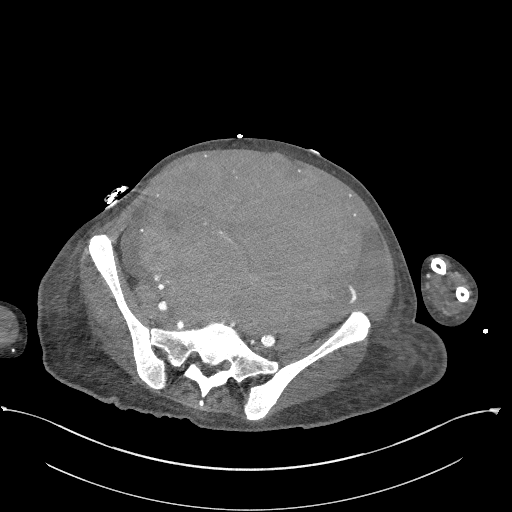
[im 135/231  soft-tissue]
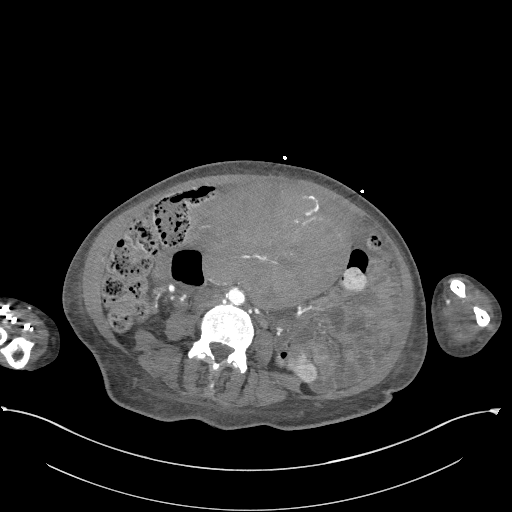
[im 154/231  soft-tissue]
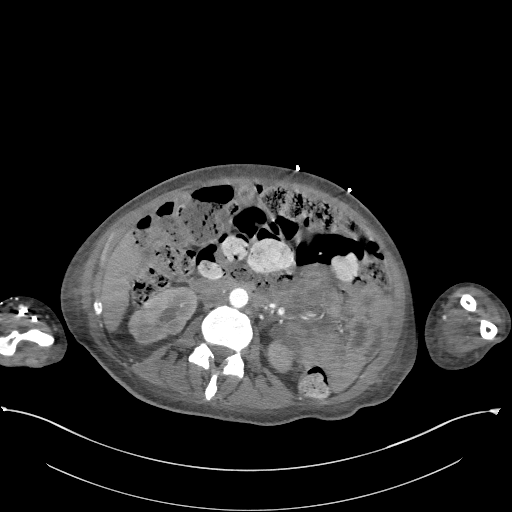

[Series 8: portal venous · axial · portal-venous · 0.95mm/px · z∈[-798,-568]mm · 3 of 92 slices shown, 7 images]
[im 23/92  soft-tissue]
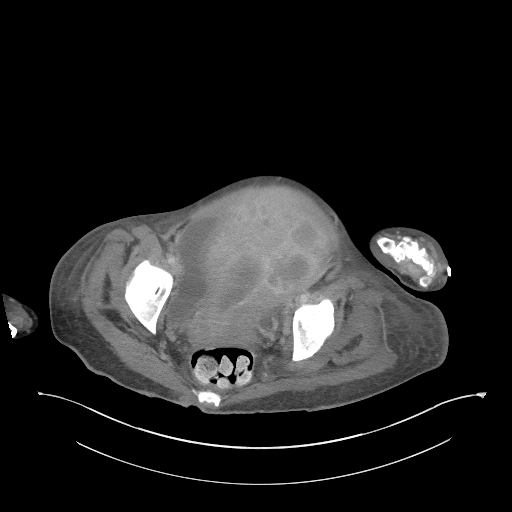
[im 23/92  lung]
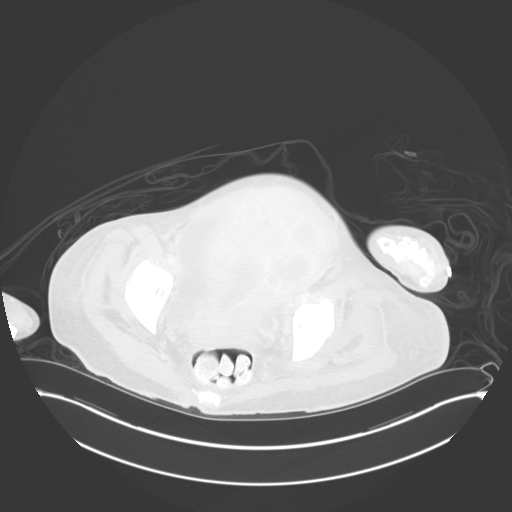
[im 23/92  bone]
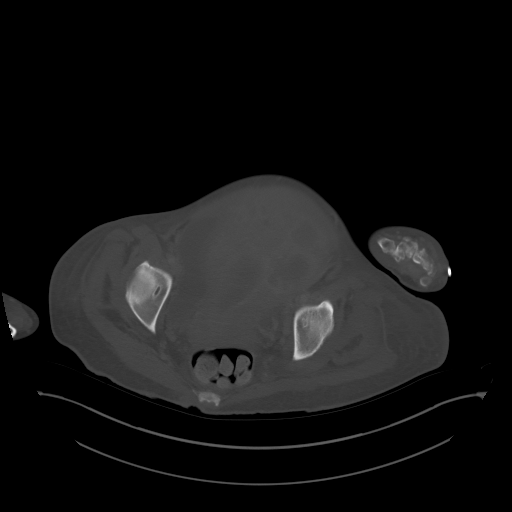
[im 46/92  soft-tissue]
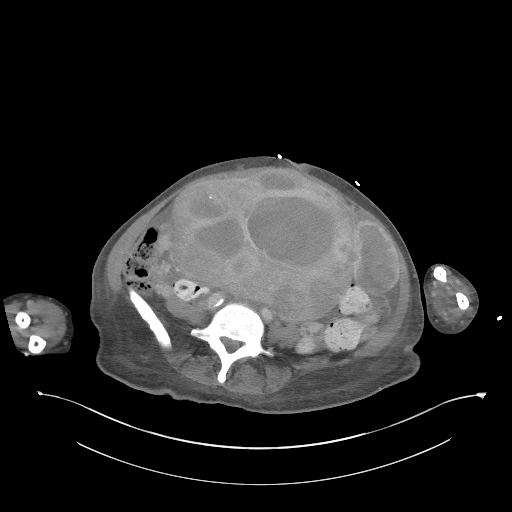
[im 46/92  lung]
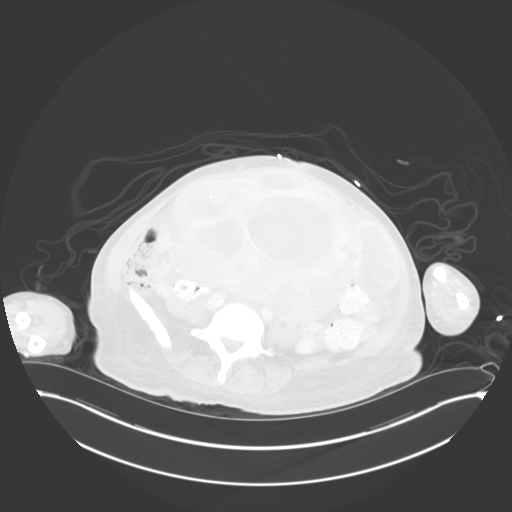
[im 69/92  soft-tissue]
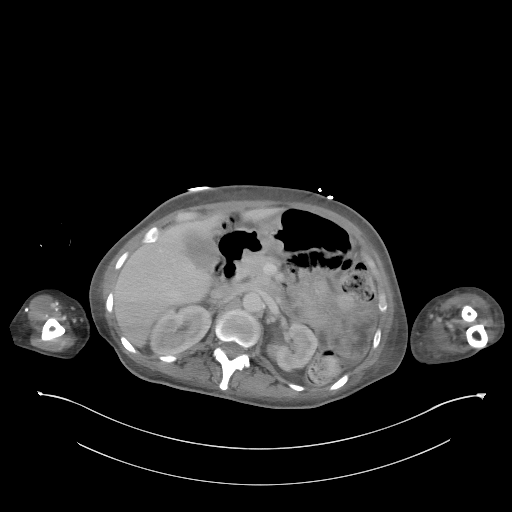
[im 69/92  lung]
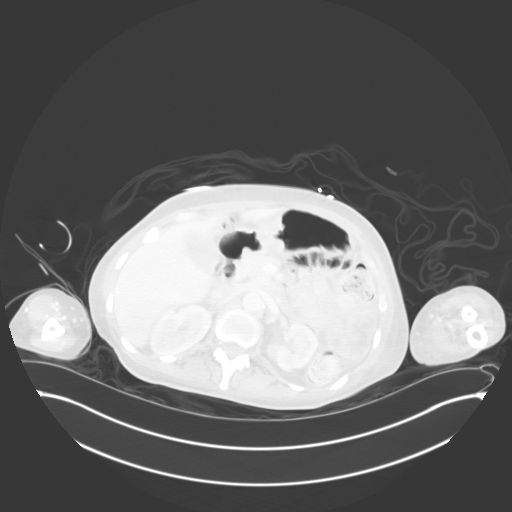

[Series 11: coronal art · coronal · 0.65mm/px · 2 of 136 slices shown, 3 images]
[im 46/136  soft-tissue]
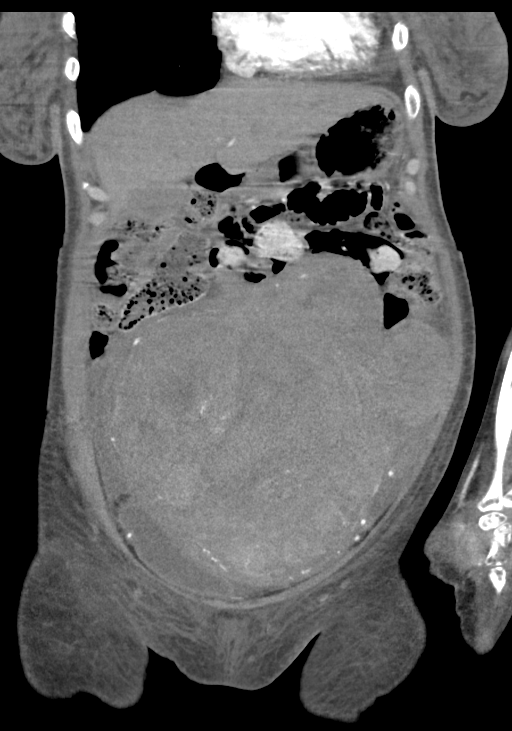
[im 46/136  bone]
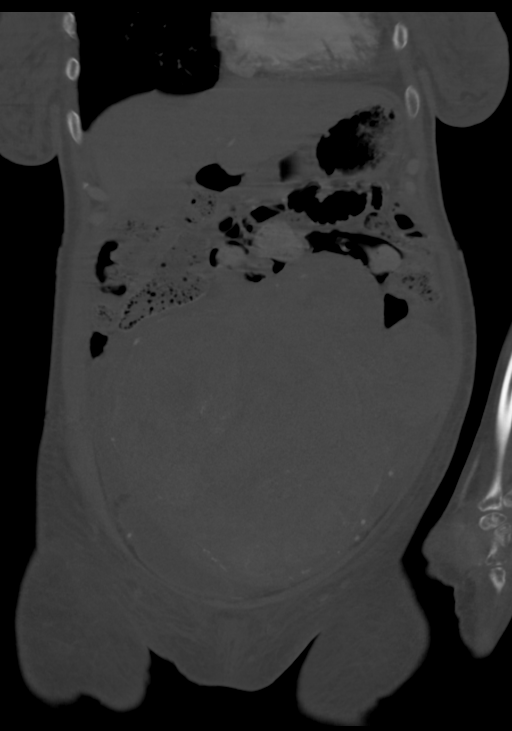
[im 91/136  soft-tissue]
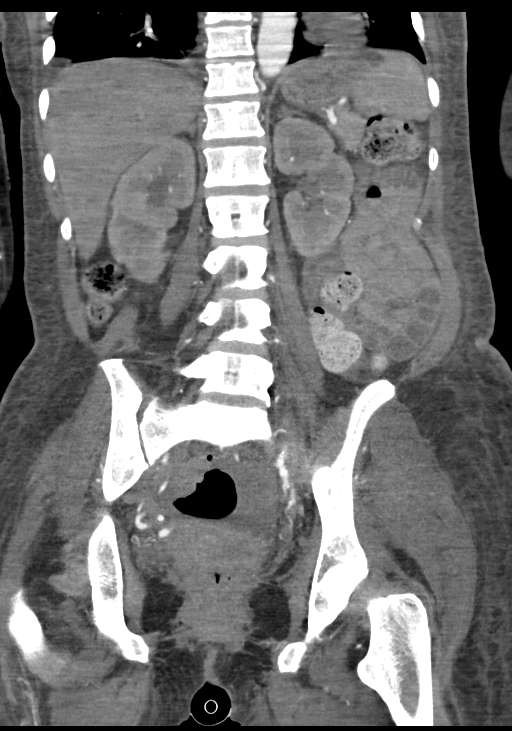

[11 of 46 positions shown; findings below may reference images not displayed]

FINDINGS: VASCULAR

Aorta: Mild atherosclerotic calcifications along the abdominal
aorta. No evidence of aneurysm or dissection.

Celiac: Patent without evidence of aneurysm, dissection, vasculitis
or significant stenosis.

SMA: Patent without evidence of aneurysm, dissection, vasculitis or
significant stenosis.

Renals: Both renal arteries are patent without evidence of aneurysm,
dissection, vasculitis, fibromuscular dysplasia or significant
stenosis.

IMA: Patent without evidence of aneurysm, dissection, vasculitis or
significant stenosis.

Inflow: Patent without evidence of aneurysm, dissection, vasculitis
or significant stenosis.

Proximal Outflow: Bilateral common femoral and visualized portions
of the superficial and profunda femoral arteries are patent without
evidence of aneurysm, dissection, vasculitis or significant
stenosis.

Other: Hypertrophic uterine arteries bilaterally supplying the
massively enlarged uterus. The origins of both uterine arteries are
easily identified arising from the anterior divisions of the
internal iliac arteries bilaterally. Additionally, both ovarian
arteries are slightly hyperplastic and likely providing supply to
the fibroid uterus. The origins of the ovarian arteries can be
identified on the coronal reformatted images.

Veins: No obvious venous abnormality within the limitations of this
arterial phase study.

Review of the MIP images confirms the above findings.

NON-VASCULAR

Lower chest: Cardiomegaly.  Small right pleural effusion.

Hepatobiliary: No focal liver abnormality is seen. No gallstones,
gallbladder wall thickening, or biliary dilatation.

Pancreas: Unremarkable. No pancreatic ductal dilatation or
surrounding inflammatory changes.

Spleen: Normal in size without focal abnormality.

Adrenals/Urinary Tract: Adrenal glands are unremarkable. Kidneys are
normal, without renal calculi, focal lesion, or hydronephrosis.
Bladder is unremarkable. Mild lobularity of the kidneys without
focal defect or scarring.

Stomach/Bowel: Stomach is within normal limits. Appendix appears
normal. No evidence of bowel wall thickening, distention, or
inflammatory changes.

Lymphatic: No suspicious lymphadenopathy.

Reproductive: Massively enlarged multi fibroid uterus measuring
approximately 19.6 x 16.4 x 22 cm (volume = [97] cm^3). No
evidence of active arterial bleeding at this time. Many of the
fibroids are heterogeneous and some demonstrate areas of hypo
vascularity consistent with regions of prior degeneration.
Dystrophic calcifications are present bilaterally. At least 1
pedunculated subserosal fibroid arising from the left aspect of the
uterus. No definitive ovarian mass.

Other: Trace pelvic ascites is likely within normal limits for
reproductive age female.

Musculoskeletal: No acute or significant osseous findings.
IMPRESSION: 1. Massive multi fibroid uterus with a prominent pedunculated
subserosal fibroid arising from the left aspect of the uterine body.
Approximate size of the uterus is 3.7 kg.
2. No evidence of active arterial bleeding at this time.
3. Urine arterial supply via bilateral hypertrophic uterine arteries
arising from their typical anatomical position as well as bilateral
hypertrophic ovarian arteries (see coronal reformatted images to
identify vessel origins).
4. Mild atherosclerotic calcifications without focal stenosis or
occlusion.
5. Cardiomegaly consistent with history of CHF.
6. Small right pleural effusion.

## 2021-10-12 MED ORDER — BISACODYL 5 MG PO TBEC
10.0000 mg | DELAYED_RELEASE_TABLET | Freq: Every day | ORAL | Status: DC | PRN
  Administered 2021-10-12: 10 mg via ORAL
  Filled 2021-10-12: qty 2

## 2021-10-12 MED ORDER — IOHEXOL 350 MG/ML SOLN
100.0000 mL | Freq: Once | INTRAVENOUS | Status: AC | PRN
  Administered 2021-10-12: 100 mL via INTRAVENOUS

## 2021-10-12 MED ORDER — CARVEDILOL 25 MG PO TABS
25.0000 mg | ORAL_TABLET | Freq: Two times a day (BID) | ORAL | Status: DC
  Administered 2021-10-12 – 2021-10-27 (×30): 25 mg via ORAL
  Filled 2021-10-12 (×11): qty 1
  Filled 2021-10-12: qty 2
  Filled 2021-10-12: qty 1
  Filled 2021-10-12: qty 2
  Filled 2021-10-12 (×8): qty 1
  Filled 2021-10-12: qty 2
  Filled 2021-10-12: qty 1
  Filled 2021-10-12 (×2): qty 2
  Filled 2021-10-12 (×4): qty 1

## 2021-10-12 NOTE — Consult Note (Addendum)
[place]  [phone] [phone]    [place]  Referring MD: Dr. Marylu Lund  Reason for Referral: Positive for lupus anticoagulant  HPI: Ms. Stofko is a 45 year old female with a past medical history significant for CVA with left-sided hemiplegia, SVT, CHF, chronic anemia, hypertension, uterine fibroids.  She was brought to the emergency department from a skilled nursing facility due to anemia and continuous menorrhagia.  She was febrile in the emergency department her hemoglobin was found to be 2.9.  Case was discussed with OB/GYN who advised getting a pelvic ultrasound which showed an enlarged uterus with multiple uterine fibroids measuring up to 6.5 cm, heterogeneous, thickened endometrium measuring 2.7 cm.  Patient has received 4 units PRBCs and 1 unit of FFP this admission.  She has been started on Megace for her menorrhagia.  OB/GYN has seen her and they state that she is not a surgical candidate for hysterectomy.  They would recommend Mirena or possible embolization for control of bleeding.  The patient has been on Eliquis in the past per neurology for her history of CVA.  She was referred to hematology as an outpatient but not yet seen in our office to help with management of anticoagulation.  Of note, the patient is positive for lupus anticoagulant on 2 occasions-in June 2022 at that time she was only on aspirin as best I can tell from the records and again in October 2022 and at that time she was on Eliquis.  I met with the patient in her hospital room.  No family at the bedside.  The patient is limited in terms of the amount history she can give me.  I discussed her case with the hospitalist extensively.  I also reviewed her chart.  The patient tells me that she has persistent left hemiplegia.  She cannot tell me if she is bleeding today but she is aware that she has a longstanding history of fibroids.  She tells me that she  did have a DVT in her left leg which she thinks was about 6 months ago.  Hematology was asked see the patient make recommendations regarding anticoagulation.  Past Medical History:  Diagnosis Date   CHF (congestive heart failure) (HCC)    Chronic blood loss anemia    Related to uterine fibroids   Essential hypertension    Poorly controlled, repeat by report only on nicardipine 90 mg   Fibroid    Likely complicated by by menometrorrhagia and chronic anemia   Hypertension    Paroxysmal SVT (supraventricular tachycardia) (HCC)    Frequent episodes, can last anywhere from 20 minutes to 12-15 hours.  Not on beta-blocker or calcium channel blocker   Uterine fibroid   :  Past Surgical History:  Procedure Laterality Date   IR ANGIOGRAM FOLLOW UP STUDY  05/15/2021   IR CT HEAD LTD  05/15/2021   IR PERCUTANEOUS ART THROMBECTOMY/INFUSION INTRACRANIAL INC DIAG ANGIO  05/15/2021       IR PERCUTANEOUS ART THROMBECTOMY/INFUSION INTRACRANIAL INC DIAG ANGIO  05/15/2021   IR TRANSCATH/EMBOLIZ  05/15/2021   RADIOLOGY WITH ANESTHESIA N/A 05/15/2021   Procedure: IR WITH ANESTHESIA;  Surgeon: Radiologist, Medication, MD;  Location: [facility];  Service: Radiology;  Laterality: N/A;   Unknown    :  CURRENT MEDS: Current Facility-Administered Medications  Medication Dose Route Frequency Provider Last Rate Last Admin   0.9 %  sodium chloride infusion   Intravenous PRN Donne Hazel, MD 10 mL/hr at 10/12/21  0600 Infusion Verify at 10/12/21 0600   acetaminophen (TYLENOL) tablet 650 mg  650 mg Oral Q6H PRN Rise Patience, MD   650 mg at 10/11/21 2109   Or   acetaminophen (TYLENOL) suppository 650 mg  650 mg Rectal Q6H PRN Rise Patience, MD       amiodarone (PACERONE) tablet 200 mg  200 mg Oral Daily Rise Patience, MD   200 mg at 10/11/21 3244   atorvastatin (LIPITOR) tablet 40 mg  40 mg Oral Daily Rise Patience, MD   40 mg at 10/11/21 0102   baclofen (LIORESAL) tablet 20 mg  20 mg Oral  TID Rise Patience, MD   20 mg at 10/11/21 2101   carvedilol (COREG) tablet 25 mg  25 mg Oral BID Donne Hazel, MD   25 mg at 10/12/21 0804   ceFEPIme (MAXIPIME) 2 g in sodium chloride 0.9 % 100 mL IVPB  2 g Intravenous Q8H Adrian Saran, [place]   Stopped at 10/12/21 7253   Chlorhexidine Gluconate Cloth 2 % PADS 6 each  6 each Topical Daily Rise Patience, MD   6 each at 10/11/21 1010   cloNIDine (CATAPRES) tablet 0.1 mg  0.1 mg Oral TID Rise Patience, MD   0.1 mg at 10/11/21 2102   feeding supplement (ENSURE ENLIVE / ENSURE PLUS) liquid 237 mL  237 mL Oral TID BM Rise Patience, MD   237 mL at 10/11/21 2057   hydrALAZINE (APRESOLINE) injection 10 mg  10 mg Intravenous Q4H PRN Rise Patience, MD   10 mg at 10/12/21 6644   hydrALAZINE (APRESOLINE) tablet 100 mg  100 mg Oral TID Rise Patience, MD   100 mg at 10/11/21 2103   isosorbide mononitrate (IMDUR) 24 hr tablet 60 mg  60 mg Oral Daily Rise Patience, MD   60 mg at 10/11/21 0347   MEDLINE mouth rinse  15 mL Mouth Rinse BID Donne Hazel, MD   15 mL at 10/11/21 2102   megestrol (MEGACE) tablet 40 mg  40 mg Oral BID Rise Patience, MD   40 mg at 10/11/21 2102   nystatin (MYCOSTATIN) 100000 UNIT/ML suspension 500,000 Units  5 mL Oral QID Donne Hazel, MD   500,000 Units at 10/11/21 2101   sacubitril-valsartan (ENTRESTO) 97-103 mg per tablet  1 tablet Oral BID Rise Patience, MD   1 tablet at 10/11/21 2101   traMADol (ULTRAM) tablet 50 mg  50 mg Oral Q6H PRN Rise Patience, MD   50 mg at 10/11/21 1112   vancomycin (VANCOREADY) IVPB 1250 mg/250 mL  1,250 mg Intravenous Q24H Angela Adam, [facility]   Stopped at 10/11/21 2334      No Known Allergies:   Family History  Problem Relation Age of Onset   Dementia Mother   :   Social History   Socioeconomic History   Marital status: Single    Spouse name: Not on file   Number of children: 0   Years of education: Not on file    Highest education level: Not on file  Occupational History   Not on file  Tobacco Use   Smoking status: Every Day   Smokeless tobacco: Never  Vaping Use   Vaping Use: Never used  Substance and Sexual Activity   Alcohol use: Not Currently    Comment: Occasionally   Drug use: Not Currently    Types: Marijuana   Sexual activity: Not  on file  Other Topics Concern   Not on file  Social History Narrative   ** Merged History Encounter **       Social Determinants of Health   Financial Resource Strain: High Risk   Difficulty of Paying Living Expenses: Very hard  Food Insecurity: No Food Insecurity   Worried About Charity fundraiser in the Last Year: Never true   Ran Out of Food in the Last Year: Never true  Transportation Needs: No Transportation Needs   Lack of Transportation (Medical): No   Lack of Transportation (Non-Medical): No  Physical Activity: Not on file  Stress: Not on file  Social Connections: Not on file  Intimate Partner Violence: Not on file  :  REVIEW OF SYSTEMS: Unable to obtain a comprehensive review of systems, pertinent positives noted in the HPI.  Exam: Patient Vitals for the past 24 hrs:  BP Temp Temp src Pulse Resp SpO2  10/12/21 0800 -- 98.3 F (36.8 C) Oral -- -- --  10/12/21 0700 (!) 183/75 -- -- 78 10 100 %  10/12/21 0616 (!) 192/78 -- -- 71 13 100 %  10/12/21 0500 (!) 169/83 -- -- 69 13 100 %  10/12/21 0413 (!) 157/81 -- -- 63 10 100 %  10/12/21 0400 -- 99.2 F (37.3 C) -- -- -- --  10/12/21 0200 123/60 -- -- 69 10 100 %  10/12/21 0100 (!) 173/84 -- -- 70 12 100 %  10/12/21 0007 134/62 -- -- 62 12 100 %  10/11/21 2330 -- 98 F (36.7 C) Oral -- -- --  10/11/21 2300 (!) 104/41 -- -- 66 11 100 %  10/11/21 2200 (!) 153/68 -- -- 66 11 100 %  10/11/21 2100 128/72 -- -- 65 -- 100 %  10/11/21 2000 (!) 92/28 -- -- 66 15 100 %  10/11/21 1926 -- 98.1 F (36.7 C) Axillary -- -- --  10/11/21 1900 (!) 121/49 -- -- 69 20 100 %  10/11/21 1800 (!)  142/64 -- -- 73 16 100 %  10/11/21 1700 (!) 129/94 -- -- 77 15 100 %  10/11/21 1600 (!) 176/73 98.1 F (36.7 C) Axillary 69 19 100 %  10/11/21 1506 (!) 162/71 -- -- -- -- --  10/11/21 1500 (!) 162/71 -- -- 67 18 100 %  10/11/21 1400 132/66 -- -- 70 15 100 %  10/11/21 1300 (!) 150/66 -- -- 60 20 100 %  10/11/21 1200 (!) 145/72 -- -- 70 16 100 %  10/11/21 1159 -- 98.4 F (36.9 C) Oral -- -- --  10/11/21 1100 (!) 146/76 -- -- 76 17 100 %  10/11/21 1000 (!) 188/79 -- -- 64 (!) 8 100 %    General: Chronically ill-appearing female, no distress.   Eyes:  no scleral icterus.   ENT:  There were no oropharyngeal lesions.    Respiratory: lungs were clear bilaterally without wheezing or crackles.   Cardiovascular:  Regular rate and rhythm, S1/S2, without murmur, rub or gallop.  Right lower extremity edema, left leg with 2+ edema. GI:  abdomen was soft, flat, nontender, nondistended, without organomegaly.     Skin exam was without ecchymosis, petechiae.   Neuro: Left hemiplegia, alert, slow to answer questions  LABS:  Lab Results  Component Value Date   WBC 8.2 10/12/2021   HGB 9.9 (L) 10/12/2021   HCT 32.8 (L) 10/12/2021   PLT 247 10/12/2021   GLUCOSE 95 10/12/2021   CHOL 131 05/16/2021   TRIG 81 05/24/2021  HDL 19 (L) 05/16/2021   LDLCALC 76 05/16/2021   ALT 11 10/12/2021   AST 25 10/12/2021   NA 137 10/12/2021   K 4.3 10/12/2021   CL 109 10/12/2021   CREATININE 0.77 10/12/2021   BUN 19 10/12/2021   CO2 21 (L) 10/12/2021   INR 1.3 (H) 10/10/2021   HGBA1C 5.4 05/16/2021    DG Abd 1 View  Result Date: 10/10/2021 CLINICAL DATA:  Abdominal distension EXAM: ABDOMEN - 1 VIEW COMPARISON:  None. FINDINGS: There is no evidence of bowel obstruction. Mass effect from known pelvic mass. There are no radiopaque calculi overlying the kidneys or course of the ureters. No acute osseous abnormality. IMPRESSION: No evidence of bowel obstruction. Electronically Signed   By: Maurine Simmering M.D.    On: 10/10/2021 14:31   US PELVIS (TRANSABDOMINAL ONLY)  Result Date: 10/10/2021 CLINICAL DATA:  Pelvic pain, bleeding, uterine fibroids EXAM: TRANSABDOMINAL ULTRASOUND OF PELVIS TECHNIQUE: Transabdominal ultrasound examination of the pelvis was performed including evaluation of the uterus, ovaries, adnexal regions, and pelvic cul-de-sac. COMPARISON:  CT abdomen/pelvis dated 01/11/2021 FINDINGS: Uterus Measurements: 24.7 x 13.7 x 22.5 cm = volume: 3982 mL. Multiple uterine fibroids, including: --6.5 x 4.9 x 5.2 cm intramural fibroid in the right posterior uterine body --6.2 x 6.3 x 5.1 cm intramural fibroid in the right posterior uterine fundus --4.1 x 3.1 x 4.4 cm subserosal fibroid in the left anterior uterine body Endometrium Thickness: 27 mm. Heterogeneous thickening with complex soft tissue/fluid in the endometrial cavity. Right ovary Not discretely visualized.  No adnexal mass is seen. Left ovary Not discretely visualized.  No adnexal mass is seen. Other findings:  No abnormal free fluid. IMPRESSION: Enlarged uterus with multiple uterine fibroids, measuring up to 6.5 cm, as above. Heterogeneous, thickened endometrium, measuring 2.7 cm. Endometrial sampling is suggested. Electronically Signed   By: Julian Hy M.D.   On: 10/10/2021 02:54   DG Chest Port 1 View  Result Date: 10/09/2021 CLINICAL DATA:  Anemia EXAM: PORTABLE CHEST 1 VIEW COMPARISON:  06/02/2021 FINDINGS: Lungs are clear.  No pleural effusion or pneumothorax. Skin fold along the right lateral hemithorax. Cardiomegaly. IMPRESSION: Cardiomegaly. No evidence of acute cardiopulmonary disease. Electronically Signed   By: Julian Hy M.D.   On: 10/09/2021 22:17     ASSESSMENT AND PLAN:  This is a 45 year old female with  1) Lupus anticoagulant positive x2 (second test while on Eliquis and unclear if false positive)  2) menorrhagia secondary to uterine fibroids  3) anemia due to iron deficiency from menorrhagia -Labs from  10/10/2021-ferritin 19, iron 8, percent saturation 3%, TIBC 302  4) history of CVA with resultant left hemiplegia  5) hypertension  6) history of CHF  7) history of SVT.  Patient says A. fib but this is uncertain.  PLAN: -Chart has been extensively reviewed and challenging situation given that she is at high risk for recurrent CVA but she has active bleeding due to uterine fibroids. -Unclear if lupus anticoagulant is truly positive as second test was drawn while on Eliquis.  Therefore, will send repeat lupus anticoagulant today since she is off Eliquis at this time and will also send hexagonal phase phospholipid.  These will likely not result until sometime next week. -Recommend determining a plan to gain control of the bleeding.  Megace may not be ideal in this situation as it can increase the risk for VTE.  Discussed with hospitalist who plans to reach out to IR to discuss the possibility of embolization. -Hospitalist  also discussed with me that neurology is concern for her increased risk of stroke if off anticoagulation.  They plan to discuss further with her primary neurologist who is on-call tomorrow.  If bleeding can be controlled, we can consider starting her on anticoagulation.  Will discuss with Dr. Irene Limbo about the possibility of starting her on a heparin drip in the short-term to see if she develops any recurrent bleeding at this time versus waiting and resuming Eliquis once we know uterine fibroids have been fully managed. -The patient has iron deficiency anemia due to her bleeding.  Recommend administering a dose of IV iron.  Thank you for this referral.  Mikey Bussing, DNP, AGPCNP-BC, AOCNP   ADDENDUM  .Patient was Personally and independently interviewed, examined and relevant elements of the history of present illness were reviewed in details and an assessment and plan was created. All elements of the patient's history of present illness , assessment and plan were discussed in  details with Mikey Bussing, DNP, AGPCNP-BC, AOCNP. The above documentation reflects our combined findings assessment and plan.  Patient with history of moderately large right MCA infarct in May 2022 with several chronic small vessel disease with multiple chronic infarcts.  Patient has been on Eliquis as per neurology in the context of possible lupus anticoagulant positivity.  The second repeat lupus anticoagulant positivity was in the context of Eliquis use.  Patient has had history of multiple large polyps and has had extensive menorrhagia and likely pelvic venous compression. She came in with severe uterine bleeding and a hemoglobin of 2 and significant iron deficiency.  Patient also noted to have extensive age-indeterminate  thrombus involving the left common iliac vein. There is evidence of age indeterminate thrombus involving the left external iliac vein. Distal IVC is patent, although appears potentially compressed. 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   Recommendations -Patient was already recommended to be on therapeutic Eliquis as per neurology given her large right MCA infarct with left-sided hemiparesis.  In the context of severe uterine bleeding from fibroids and admission for life-threatening symptomatic anemia will need to determine neurology recommendations about ongoing use of blood thinners for CVA prophylaxis. -OB/GYN input regarding options to control bleeding from very symptomatic fibroids and possibility of addressing venous compression from her enlarged uterus with severe fibroids. -Repeat lupus anticoagulant test currently off Eliquis and ask hexagonal phospholipid neutralization study to confirm this. -Her DVT in the left lower extremity is likely from extensive venous compression from her enlarged uterus with fibroids. -Decisions  for ongoing anticoagulation will be determined by patient's goals of care and ability of OB/GYN to address bleeding risk from fibroids and neurology's input regarding need for anticoagulation related to her CVA. -Megace can increase the risk of thrombosis.  GYN could consider GnRH analogs such as Lupron to try to shrink the uterus and stop the bleeding.  Will defer appropriateness of this option to GYN. -IR has been involved to determine appropriateness of venous stenting and to consider uterine embolization since GYN does not feel the patient is a candidate for surgery. -Appreciate excellent care by hospital medicine Dr. Wyline Copas.  Case was discussed with Dr. Wyline Copas.    Sullivan Lone MD MS

## 2021-10-12 NOTE — Consult Note (Signed)
Consultation Note Date: 10/12/2021   [name]  DOB: 1976-07-29  MRN: [id number]  Age / Sex: 45 y.o., female  PCP: Mitzi Hansen, MD Referring Physician: Donne Hazel, MD  Reason for Consultation: Establishing goals of care  HPI/Patient Profile: 45 y.o. female  with past medical history of CVA with left-sided hemiplegia, SVT, CHF, chronic anemia, hypertension, fibroids admitted on 10/09/2021 with anemia and continuous menorrhagia.  Her situation is difficult as she is on anticoagulation with Eliquis due to stroke but is having significant blood loss.  She was evaluated by gynecology and oncology.  Still awaiting input from neurology and also awaiting input to see if she may be a candidate for uterine artery embolization with IR.  Palliative consulted for goals of care.  Clinical Assessment and Goals of Care: Plan of care consult received.  Chart reviewed including personal review of pertinent labs and imaging.  She has known to our team from prior encounters earlier this spring.  I saw and examined [name] today.  On my arrival to the her room, she was sleeping.  She did awaken, but she was sleepy throughout the encounter.  I attempted to ascertain her understanding of the situation and she referred me to speak with her brother, [name].  She does currently deny any pain or shortness of breath.  While she does understand her situation with bleeding, its not clear to me if she understands the nuances associated with everything that is going on.  I attempted to call her brother but was not able to reach him today.  I left a voicemail with number for return call.  SUMMARY OF RECOMMENDATIONS   -No CPR in the event of cardiac arrest -Complicated situation.  Discussed in detail with Dr. Wyline Copas.  Overall, they are not good options moving forward for [name].  Await input from other  subspecialists (neuro and IR) and at that point in time we will need to discuss risks and benefits with patient and her brother to make best decisions about how to care for her moving forward. -Hayat defers to her brother when asked about any medical questions.  I attempted to call him today was unable to reach him. -We will plan to follow-up tomorrow.  Code Status/Advance Care Planning: Limited code  Palliative Prophylaxis:  Frequent Pain Assessment  Additional Recommendations (Limitations, Scope, Preferences): Full Scope Treatment  Psycho-social/Spiritual:  Desire for further Chaplaincy support:no Additional Recommendations: Caregiving  Support/Resources  Prognosis:  Guarded  Discharge Planning: To Be Determined      Primary Diagnoses: Present on Admission:  Acute blood loss anemia  Hemorrhagic stroke ([place])  Uncontrolled hypertension  Left hemiparesis (HCC)  Menorrhagia  Uterine fibroid   I have reviewed the medical record, interviewed the patient and family, and examined the patient. The following aspects are pertinent.  Past Medical History:  Diagnosis Date   CHF (congestive heart failure) (HCC)    Chronic blood loss anemia    Related to uterine fibroids   Essential hypertension  Poorly controlled, repeat by report only on nicardipine 90 mg   Fibroid    Likely complicated by by menometrorrhagia and chronic anemia   Hypertension    Paroxysmal SVT (supraventricular tachycardia) (HCC)    Frequent episodes, can last anywhere from 20 minutes to 12-15 hours.  Not on beta-blocker or calcium channel blocker   Uterine fibroid    Social History   Socioeconomic History   Marital status: Single    Spouse name: Not on file   Number of children: 0   Years of education: Not on file   Highest education level: Not on file  Occupational History   Not on file  Tobacco Use   Smoking status: Every Day   Smokeless tobacco: Never  Vaping Use   Vaping Use: Never used   Substance and Sexual Activity   Alcohol use: Not Currently    Comment: Occasionally   Drug use: Not Currently    Types: Marijuana   Sexual activity: Not on file  Other Topics Concern   Not on file  Social History Narrative   ** Merged History Encounter **       Social Determinants of Health   Financial Resource Strain: High Risk   Difficulty of Paying Living Expenses: Very hard  Food Insecurity: No Food Insecurity   Worried About Charity fundraiser in the Last Year: Never true   Ran Out of Food in the Last Year: Never true  Transportation Needs: No Transportation Needs   Lack of Transportation (Medical): No   Lack of Transportation (Non-Medical): No  Physical Activity: Not on file  Stress: Not on file  Social Connections: Not on file   Family History  Problem Relation Age of Onset   Dementia Mother    Scheduled Meds:  amiodarone  200 mg Oral Daily   atorvastatin  40 mg Oral Daily   baclofen  20 mg Oral TID   carvedilol  25 mg Oral BID   Chlorhexidine Gluconate Cloth  6 each Topical Daily   cloNIDine  0.1 mg Oral TID   feeding supplement  237 mL Oral TID BM   hydrALAZINE  100 mg Oral TID   isosorbide mononitrate  60 mg Oral Daily   mouth rinse  15 mL Mouth Rinse BID   megestrol  40 mg Oral BID   nystatin  5 mL Oral QID   sacubitril-valsartan  1 tablet Oral BID   Continuous Infusions:  sodium chloride 10 mL/hr at 10/12/21 1506   ceFEPime (MAXIPIME) IV Stopped (10/12/21 1349)   vancomycin Stopped (10/11/21 2334)   PRN Meds:.sodium chloride, acetaminophen **OR** acetaminophen, hydrALAZINE, traMADol Medications Prior to Admission:  Prior to Admission medications   Medication Sig Start Date End Date Taking? Authorizing Provider  acetaminophen (TYLENOL) 325 MG tablet Place 2 tablets (650 mg total) into feeding tube every 4 (four) hours as needed for mild pain (or temp > 37.5 C (99.5 F)). Patient taking differently: Take 650 mg by mouth every 4 (four) hours as needed  for mild pain (or temp > 37.5 C (99.5 F)). 07/03/21  Yes Samella Parr, NP  Amino Acids-Protein Hydrolys (FEEDING SUPPLEMENT, PRO-STAT SUGAR FREE 64,) LIQD Take 30 mLs by mouth 2 (two) times daily.   Yes [provider]  amiodarone (PACERONE) 200 MG tablet Take 200 mg by mouth daily. 02/02/21  Yes [provider]  apixaban (ELIQUIS) 5 MG TABS tablet Take 5 mg by mouth 2 (two) times daily.   Yes [provider]  aspirin 81 MG chewable tablet Chew 1 tablet (81 mg total) by mouth daily. 07/04/21  Yes Samella Parr, NP  atorvastatin (LIPITOR) 40 MG tablet Take 1 tablet (40 mg total) by mouth daily. 07/04/21  Yes Samella Parr, NP  baclofen (LIORESAL) 20 MG tablet Take 20 mg by mouth 4 (four) times daily.   Yes [provider]  carvedilol (COREG) 25 MG tablet Take 1 tablet (25 mg total) by mouth 2 (two) times daily with a meal. 07/03/21  Yes Samella Parr, NP  cloNIDine (CATAPRES) 0.1 MG tablet Take 1 tablet (0.1 mg total) by mouth 3 (three) times daily. 07/03/21  Yes Samella Parr, NP  ENTRESTO 97-103 MG Take 1 tablet by mouth 2 (two) times daily. 02/20/21  Yes [provider]  escitalopram (LEXAPRO) 10 MG tablet Take 15 mg by mouth daily.   Yes [provider]  feeding supplement (ENSURE ENLIVE / ENSURE PLUS) LIQD Take 237 mLs by mouth 3 (three) times daily between meals. 07/03/21  Yes Samella Parr, NP  ferrous sulfate 325 (65 FE) MG tablet Take 1 tablet (325 mg total) by mouth 2 (two) times daily with a meal. 02/20/21  Yes Lyda Jester M, PA-C  gabapentin (NEURONTIN) 300 MG capsule Take 300 mg by mouth 3 (three) times daily.   Yes [provider]  hydrALAZINE (APRESOLINE) 100 MG tablet Take 1 tablet (100 mg total) by mouth 3 (three) times daily. 03/02/21  Yes Bensimhon, Shaune Pascal, MD  isosorbide mononitrate (IMDUR) 60 MG 24 hr tablet Take 60 mg by mouth daily. 02/02/21  Yes [provider]  lactulose (CHRONULAC) 10  GM/15ML solution Take 15 mLs (10 g total) by mouth 2 (two) times daily. 07/03/21  Yes Samella Parr, NP  linaclotide (LINZESS) 145 MCG CAPS capsule Take 145 mcg by mouth daily before breakfast.   Yes [provider]  Multiple Vitamin (MULTIVITAMIN WITH MINERALS) TABS tablet Take 1 tablet by mouth daily. 07/04/21  Yes Samella Parr, NP  pantoprazole (PROTONIX) 40 MG tablet Take 40 mg by mouth daily.   Yes [provider]  sacubitril-valsartan (ENTRESTO) 97-103 MG Take 1 tablet by mouth 2 (two) times daily. 02/20/21  Yes Rosita Fire, Brittainy M, PA-C  simethicone (MYLICON) 40 OT/1.5BW drops Take 0.6 mLs (40 mg total) by mouth every 6 (six) hours as needed for flatulence. 07/03/21  Yes Samella Parr, NP  ARIPiprazole (ABILIFY) 2 MG tablet Take 2 mg by mouth daily. Patient not taking: Reported on 10/10/2021    [provider]  gabapentin (NEURONTIN) 100 MG capsule Take 100 mg by mouth 3 (three) times daily. Patient not taking: Reported on 10/10/2021    [provider]  megestrol (MEGACE) 40 MG tablet Take 40 mg by mouth 2 (two) times daily. Patient not taking: Reported on 10/10/2021    [provider]  oxyCODONE HCl 7.5 MG TABS Take 7.5 mg by mouth every 4 (four) hours as needed. Patient not taking: Reported on 10/10/2021 07/03/21   Samella Parr, NP  torsemide (DEMADEX) 20 MG tablet TAKE 2 TABLETS (40 MG TOTAL) BY MOUTH 2 (TWO) TIMES DAILY. Patient not taking: Reported on 10/10/2021 03/02/21 03/02/22  Bensimhon, Shaune Pascal, MD  traMADol (ULTRAM) 50 MG tablet Take by mouth every 6 (six) hours as needed. Patient not taking: Reported on 10/10/2021    [provider]   No Known Allergies Review of Systems Denies complaints  Physical Exam General: Sleeping but arouses easily.HEENT: No bruits,  no goiter, no JVD Heart: Regular rate and rhythm. No murmur appreciated. Lungs: Good air movement, clear Abdomen: Soft, nontender, nondistended, positive  bowel sounds.   Ext: No significant edema Skin: Warm and dry   Vital Signs: BP (!) 145/66 (BP Location: Right Arm)   Pulse 73   Temp 98.3 F (36.8 C) (Oral)   Resp 10   Ht 5\' 5"  (1.651 m)   Wt 57.5 kg   LMP  (LMP Unknown)   SpO2 100%   BMI 21.09 kg/m  Pain Scale: 0-10 POSS *See Group Information*: 1-Acceptable,Awake and alert Pain Score: 3    SpO2: SpO2: 100 % O2 Device:SpO2: 100 % O2 Flow Rate: .   IO: Intake/output summary:  Intake/Output Summary (Last 24 hours) at 10/12/2021 1836 Last data filed at 10/12/2021 1506 Gross per 24 hour  Intake 612.8 ml  Output 300 ml  Net 312.8 ml    LBM: Last BM Date: 10/10/21 Baseline Weight: Weight: 68 kg Most recent weight: Weight: 57.5 kg     Palliative Assessment/Data:   Flowsheet Rows    Flowsheet Row Most Recent Value  Intake Tab   Referral Department Hospitalist  Unit at Time of Referral ICU  Palliative Care Primary Diagnosis Neurology  Date Notified 10/12/21  Palliative Care Type Return patient Palliative Care  Reason for referral Clarify Goals of Care  Date of Admission 10/09/21  Date first seen by Palliative Care 10/12/21  # of days Palliative referral response time 0 Day(s)  # of days IP prior to Palliative referral 3  Clinical Assessment   Palliative Performance Scale Score 40%  Psychosocial & Spiritual Assessment   Palliative Care Outcomes   Patient/Family meeting held? Yes       Time In: 1730 Time Out: 1825 Time Total: 55 minutes Greater than 50%  of this time was spent counseling and coordinating care related to the above assessment and plan.  Signed by: Micheline Rough, MD   Please contact Palliative Medicine Team phone at [phone] for questions and concerns.  For individual provider: See Shea Evans

## 2021-10-12 NOTE — Evaluation (Signed)
Occupational Therapy Evaluation Patient Details Name: Rafia Shedden MRN: [id number] DOB: 1976-12-10 Today's Date: 10/12/2021   History of Present Illness 45 y.o. female with history of CVA with hx of fibroids, left-sided hemiplegia, SVT, CHF, chronic anemia, hypertension was brought to the ER after patient's hemoglobin was found to be low (hgb 3.0) and patient also has been having continuous menorrhagia.   Clinical Impression   PTA, pt was participating in PT and OT rehab at a SNF, required total assist for bathing and dressing, set up for grooming and eating, and was performing transfers with hoyer lift to w/c (reports she was unable to propel herself). Upon evaluation, pt at baseline function she was performing at [facility]. Pt with dense LUE hemiplegia with no active movement and L shoulder subluxation. Pt pleasant and willing to participate in therapy despite pain. Due to current level of function, recommending pt resume OT at [facility] rehab post d/c. No acute OT needs, signing off.     Recommendations for follow up therapy are one component of a multi-disciplinary discharge planning process, led by the attending physician.  Recommendations may be updated based on patient status, additional functional criteria and insurance authorization.   Follow Up Recommendations  SNF    Equipment Recommendations  Other (comment) (Defer to next venue)    Recommendations for Other Services       Precautions / Restrictions Precautions Precautions: Fall Precaution Comments: left hemiplegia, left shoulder subluxation, lifted to w/c at SNF Restrictions Weight Bearing Restrictions: No      Mobility Bed Mobility               General bed mobility comments: pt declined attempts for even rolling today, reports she has a "sore" on her bottom, typically lifted from bed to w/c at facility    Transfers                      Balance                                            ADL either performed or assessed with clinical judgement   ADL Overall ADL's : At baseline                                             Vision Patient Visual Report: No change from baseline       Perception     Praxis      Pertinent Vitals/Pain Pain Assessment: Faces Faces Pain Scale: Hurts a little bit Pain Location: everywhere but mostly Left UE and legs especially with movement Pain Descriptors / Indicators: Grimacing Pain Intervention(s): Repositioned;Monitored during session     Hand Dominance Right   Extremity/Trunk Assessment Upper Extremity Assessment Upper Extremity Assessment: RUE deficits/detail;LUE deficits/detail RUE Deficits / Details: MMT 4/5 grip and bicep strength, shoulder flexion 4-/5 LUE Deficits / Details: LUE dense hemiplegia, no active movement, bicep reflex present. 1 finger width shoulder subluxation. LUE Sensation: WNL   Lower Extremity Assessment Lower Extremity Assessment: Defer to PT evaluation RLE Deficits / Details: grossly 2+/5, pt denies numbness/tingling, reports intact sensation bilaterally LLE Deficits / Details: increased PF tone with forced DF, keeps ankle in resting plantar flexion with inversion       Communication Communication Communication:  Expressive difficulties (increased time, slow speech)   Cognition Arousal/Alertness: Awake/alert Behavior During Therapy: Flat affect Overall Cognitive Status: Within Functional Limits for tasks assessed                                     General Comments       Exercises     Shoulder Instructions      Home Living Family/patient expects to be discharged to:: Skilled nursing facility                                        Prior Functioning/Environment Level of Independence: Needs assistance  Gait / Transfers Assistance Needed: utilized lift to w/c at rehab and states she was working on transfers and walking (?) at rehab.  Reports that she was also working with OT on typing. ADL's / Homemaking Assistance Needed: Dependent on staff for bathing and dressing, reports she performed oral care and grooming with set up.   Comments: Pt was working in [place] prior to CVA admission 4 months ago        OT Problem List: Pain      OT Treatment/Interventions:      OT Goals(Current goals can be found in the care plan section)    OT Frequency:     Barriers to D/C:            Co-evaluation PT/OT/SLP Co-Evaluation/Treatment: Yes Reason for Co-Treatment: Complexity of the patient's impairments (multi-system involvement);For patient/therapist safety;To address functional/ADL transfers PT goals addressed during session: Mobility/safety with mobility OT goals addressed during session: ADL's and self-care      AM-PAC OT "6 Clicks" Daily Activity     Outcome Measure Help from another person eating meals?: A Little Help from another person taking care of personal grooming?: A Little Help from another person toileting, which includes using toliet, bedpan, or urinal?: Total Help from another person bathing (including washing, rinsing, drying)?: A Lot Help from another person to put on and taking off regular upper body clothing?: A Lot Help from another person to put on and taking off regular lower body clothing?: Total 6 Click Score: 12   End of Session Nurse Communication: Other (comment);Mobility status (pt would like to call brother.)  Activity Tolerance: Patient limited by pain Patient left: in bed;with bed alarm set;with call bell/phone within reach  OT Visit Diagnosis: Pain                Time: 9518-8416 OT Time Calculation (min): 26 min Charges:  OT General Charges $OT Visit: 1 Visit OT Evaluation $OT Eval Low Complexity: 1 Low  Jackquline Denmark, OTS Acute Rehab Office: [phone]   Murrel Bertram 10/12/2021, 4:12 PM

## 2021-10-12 NOTE — Evaluation (Signed)
Physical Therapy Evaluation Patient Details Name: Holly Bradley MRN: [id number] DOB: 14-Mar-1976 Today's Date: 10/12/2021  History of Present Illness  45 y.o. female with history of CVA with hx of fibroids, left-sided hemiplegia, SVT, CHF, chronic anemia, hypertension was brought to the ER after patient's hemoglobin was found to be low (hgb 3.0) and patient also has been having continuous menorrhagia.  Clinical Impression  Pt admitted with above diagnosis.  Pt currently with functional limitations due to the deficits listed below (see PT Problem List). Pt will benefit from skilled PT to increase their independence and safety with mobility to allow discharge to the venue listed below.  Pt reports pain with most movement.  Pt assisted with UE and LE assessment especially with left sided hemiplegia.  Pt reports being lifted OOB to w/c at facility and was working on transfers from w/c with physical therapy per her report.  Pt is not able to self reposition so would recommend protective skin measures during hospitalization such as frequent turning and heel protection.      Recommendations for follow up therapy are one component of a multi-disciplinary discharge planning process, led by the attending physician.  Recommendations may be updated based on patient status, additional functional criteria and insurance authorization.  Follow Up Recommendations SNF    Equipment Recommendations  None recommended by PT    Recommendations for Other Services       Precautions / Restrictions Precautions Precautions: Fall Precaution Comments: left hemiplegia, left shoulder subluxation, lifted to w/c at SNF      Mobility  Bed Mobility               General bed mobility comments: pt declined attempts for even rolling today, reports she has a "sore" on her bottom, typically lifted from bed to w/c at facility    Transfers                    Ambulation/Gait                 Stairs            Wheelchair Mobility    Modified Rankin (Stroke Patients Only)       Balance                                             Pertinent Vitals/Pain Pain Assessment: Faces Faces Pain Scale: Hurts a little bit Pain Location: everywhere but mostly Left UE and legs especially with movement Pain Descriptors / Indicators: Grimacing Pain Intervention(s): Repositioned;Monitored during session    Home Living Family/patient expects to be discharged to:: Skilled nursing facility                      Prior Function Level of Independence: Needs assistance   Gait / Transfers Assistance Needed: utilized lift to w/c at rehab and states she was working on transfers and walking (?) at rehab  ADL's / Homemaking Assistance Needed: reports total assist for ADLs  Comments: Pt was working in [place] prior to CVA admission 4 months ago     Hand Dominance        Extremity/Trunk Assessment        Lower Extremity Assessment Lower Extremity Assessment: RLE deficits/detail;LLE deficits/detail RLE Deficits / Details: grossly 2+/5, pt denies numbness/tingling, reports intact sensation bilaterally LLE Deficits / Details: increased PF tone  with forced DF, keeps ankle in resting plantar flexion with inversion       Communication   Communication: Expressive difficulties (increased time, slow speech)  Cognition Arousal/Alertness: Awake/alert Behavior During Therapy: Flat affect Overall Cognitive Status: Within Functional Limits for tasks assessed                                        General Comments      Exercises     Assessment/Plan    PT Assessment Patient needs continued PT services  PT Problem List Decreased mobility;Decreased strength;Decreased balance;Decreased activity tolerance;Decreased range of motion;Decreased coordination       PT Treatment Interventions DME instruction;Wheelchair mobility training;Therapeutic  exercise;Therapeutic activities;Patient/family education;Functional mobility training;Balance training;Neuromuscular re-education    PT Goals (Current goals can be found in the Care Plan section)  Acute Rehab PT Goals PT Goal Formulation: With patient Time For Goal Achievement: 10/26/21 Potential to Achieve Goals: Poor    Frequency Min 2X/week   Barriers to discharge        Co-evaluation PT/OT/SLP Co-Evaluation/Treatment: Yes Reason for Co-Treatment: Complexity of the patient's impairments (multi-system involvement);For patient/therapist safety PT goals addressed during session: Mobility/safety with mobility;Strengthening/ROM OT goals addressed during session: ADL's and self-care       AM-PAC PT "6 Clicks" Mobility  Outcome Measure Help needed turning from your back to your side while in a flat bed without using bedrails?: Total Help needed moving from lying on your back to sitting on the side of a flat bed without using bedrails?: Total Help needed moving to and from a bed to a chair (including a wheelchair)?: Total Help needed standing up from a chair using your arms (e.g., wheelchair or bedside chair)?: Total Help needed to walk in hospital room?: Total Help needed climbing 3-5 steps with a railing? : Total 6 Click Score: 6    End of Session   Activity Tolerance: Patient limited by pain Patient left: in bed;with call bell/phone within reach;with bed alarm set Nurse Communication: Mobility status PT Visit Diagnosis: Muscle weakness (generalized) (M62.81);Hemiplegia and hemiparesis Hemiplegia - Right/Left: Left Hemiplegia - dominant/non-dominant: Non-dominant    Time: 6578-4696 PT Time Calculation (min) (ACUTE ONLY): 26 min   Charges:   PT Evaluation $PT Eval Low Complexity: 1 Low        Kati PT, DPT Acute Rehabilitation Services Pager: [phone] Office: [place] 10/12/2021, 4:06 PM

## 2021-10-12 NOTE — Progress Notes (Addendum)
PROGRESS NOTE    Holly Bradley  XNA:[id number] DOB: 1976/01/22 DOA: 10/09/2021 PCP: Mitzi Hansen, MD    Brief Narrative:  45 y.o. female with history of CVA with hx of fibroids, left-sided hemiplegia, SVT, CHF, chronic anemia, hypertension was brought to the ER after patient's hemoglobin was found to be low and patient also has been having continuous menorrhagia.  Assessment & Plan:   Principal Problem:   Acute blood loss anemia Active Problems:   Menorrhagia   Uncontrolled hypertension   Hemorrhagic stroke (HCC)   Uterine fibroid   Left hemiparesis (HCC)   Acute blood loss anemia secondary to continuous menorrhagia Pt is now s/p tranexamic acid in ED with 5 units PRBC's being transfused, in addition to FFP Hgb overall has remained stable, off further anticoagulation in the meantime Appreciate input by Gyn. Difficult situation where pt is deemed not a surgical candidate given co morbidities. Gyn recs for either Mirena IUD vs [place] if rebleeding  Discussed case with IR who recommends possible fibroid embolization next week with transfer to [facility] for now. Transfer orders placed Have updated Gyn regarding transfer and their service will be on standby for now Will repeat CBC in AM Fever with concern developing sepsis for which blood cultures were obtained and started on empiric antibiotics. UA notable only for blood, correlating to presenting menorrhagia CXR unremarkable Blood cx only pos for 1/2 staph hominis, likely contaminant Empiric abx have been discontinued Uncontrolled hypertension Was continued on clonidine hydralazine coverage on Imdur Entresto  Continue with IV hydralazine as needed Prior history of CVA with left-sided hemiplegia. Chart reviewed. Very difficult situation with concerns for suspected underlying hypercoag state Pt was previously on ASA, which she tolerated. Found to be hypercoagulable with Neurology later prescribing eliquis prior to onset of above  menorrhagia Eliquis was held secondary to presenting menorrhagia above [place] [facility] input on suspected hypercoag state with repeat hypercoag panel pending Given concerns of significant acute blood loss anemia, will ask Neurology/Stroke team to assess risk/benefits to anticoagulation LLE DVT Large DVT in LLE reviewed on doppler Discussed with radiology. DVT likely secondary to compressive mass effect of large uterus Have ordered CTA abd/pelvis per IR recs to better assess flow History of CHF  Tolerated recent PRBC transfusion Repeat cmp in AM History of SVT on amiodarone. Hemodynamically stable   DVT prophylaxis: SCD's Code Status: No CPR Family Communication: Pt in room, family over phone  Status is: Inpatient  Remains inpatient appropriate because: acute illness needing PRBC transfusion   Consultants:  Ob/Gyn Hematology IR Neurology/Stroke Palliative Care  Procedures:    Antimicrobials: Anti-infectives (From admission, onward)    Start     Dose/Rate Route Frequency Ordered Stop   10/11/21 1200  ceFEPIme (MAXIPIME) 2 g in sodium chloride 0.9 % 100 mL IVPB        2 g 200 mL/hr over 30 Minutes Intravenous Every 8 hours 10/11/21 1018     10/10/21 2200  vancomycin (VANCOREADY) IVPB 1250 mg/250 mL        1,250 mg 166.7 mL/hr over 90 Minutes Intravenous Every 24 hours 10/10/21 0402     10/10/21 1200  ceFEPIme (MAXIPIME) 2 g in sodium chloride 0.9 % 100 mL IVPB  Status:  Discontinued        2 g 200 mL/hr over 30 Minutes Intravenous Every 12 hours 10/10/21 0402 10/11/21 1018   10/09/21 2345  ceFEPIme (MAXIPIME) 2 g in sodium chloride 0.9 % 100 mL IVPB  2 g 200 mL/hr over 30 Minutes Intravenous  Once 10/09/21 2343 10/10/21 0046   10/09/21 2345  metroNIDAZOLE (FLAGYL) IVPB 500 mg        500 mg 100 mL/hr over 60 Minutes Intravenous  Once 10/09/21 2343 10/10/21 0135   10/09/21 2345  vancomycin (VANCOCIN) IVPB 1000 mg/200 mL premix        1,000 mg 200 mL/hr over  60 Minutes Intravenous  Once 10/09/21 2343 10/10/21 0135       Subjective: Difficult to assess, pt not very conversant  Objective: Vitals:   10/12/21 1100 10/12/21 1127 10/12/21 1200 10/12/21 1600  BP: (!) 201/83 (!) 145/66    Pulse: 67 73    Resp: 13 10    Temp:   98.3 F (36.8 C) 98.3 F (36.8 C)  TempSrc:   Oral Oral  SpO2: 100% 100%    Weight:      Height:        Intake/Output Summary (Last 24 hours) at 10/12/2021 1622 Last data filed at 10/12/2021 1506 Gross per 24 hour  Intake 792.8 ml  Output 300 ml  Net 492.8 ml    Filed Weights   10/09/21 2215 10/10/21 0200  Weight: 68 kg 57.5 kg    Examination: General exam: Awake, laying in bed, in nad Respiratory system: Normal respiratory effort, no wheezing Cardiovascular system: regular rate, s1, s2 Gastrointestinal system: Soft, nondistended, positive BS Central nervous system: CN2-12 grossly intact, strength intact Extremities: Perfused, no clubbing, marked LLE edema Skin: Normal skin turgor, no notable skin lesions seen Psychiatry: Mood normal // no visual hallucinations   Data Reviewed: I have personally reviewed following labs and imaging studies  CBC: Recent Labs  Lab 10/09/21 2241 10/09/21 2258 10/10/21 0003 10/10/21 0018 10/10/21 1244 10/11/21 0229 10/11/21 1349 10/12/21 0247  WBC 9.1 9.2  --   --  9.0 6.0  --  8.2  NEUTROABS 7.4 7.7  --   --   --   --   --   --   HGB 3.0* 2.9* 3.1*  --  11.1* 8.1* 9.1* 9.9*  HCT 12.0* 11.1* 11.7*  --  35.1* 26.5* 28.7* 32.8*  MCV 84.5 80.4  --   --  87.3 86.9  --  87.5  PLT 336 336  --  331 332 247  --  916    Basic Metabolic Panel: Recent Labs  Lab 10/09/21 2241 10/11/21 0229 10/12/21 0247  NA 140 138 137  K 3.9 3.8 4.3  CL 111 110 109  CO2 20* 22 21*  GLUCOSE 98 99 95  BUN 20 17 19   CREATININE 1.14* 0.84 0.77  CALCIUM 9.3 9.1 9.4    GFR: Estimated Creatinine Clearance: 78.2 mL/min (by C-G formula based on SCr of 0.77 mg/dL). Liver Function  Tests: Recent Labs  Lab 10/09/21 2241 10/11/21 0229 10/12/21 0247  AST 17 11* 25  ALT 10 8 11   ALKPHOS 104 90 105  BILITOT 0.6 1.0 0.8  PROT 7.0 6.1* 7.0  ALBUMIN 3.3* 2.9* 3.0*    No results for input(s): LIPASE, AMYLASE in the last 168 hours. No results for input(s): AMMONIA in the last 168 hours. Coagulation Profile: Recent Labs  Lab 10/09/21 2300 10/10/21 0018 10/10/21 1244  INR 1.7* 1.7* 1.3*    Cardiac Enzymes: No results for input(s): CKTOTAL, CKMB, CKMBINDEX, TROPONINI in the last 168 hours. BNP (last 3 results) No results for input(s): PROBNP in the last 8760 hours. HbA1C: No results for input(s): HGBA1C in the  last 72 hours. CBG: Recent Labs  Lab 10/10/21 0144  GLUCAP 113*    Lipid Profile: No results for input(s): CHOL, HDL, LDLCALC, TRIG, CHOLHDL, LDLDIRECT in the last 72 hours. Thyroid Function Tests: No results for input(s): TSH, T4TOTAL, FREET4, T3FREE, THYROIDAB in the last 72 hours. Anemia Panel: Recent Labs    10/10/21 0018  VITAMINB12 1,206*  FOLATE 11.3  FERRITIN 19  TIBC 302  IRON 8*  RETICCTPCT 5.6*    Sepsis Labs: Recent Labs  Lab 10/09/21 2241 10/09/21 2259  LATICACIDVEN 0.6 0.6     Recent Results (from the past 240 hour(s))  Resp Panel by RT-PCR (Flu A&B, Covid) Nasopharyngeal Swab     Status: None   Collection Time: 10/09/21  9:52 PM   Specimen: Nasopharyngeal Swab; Nasopharyngeal(NP) swabs in vial transport medium  Result Value Ref Range Status   SARS Coronavirus 2 by RT PCR NEGATIVE NEGATIVE Final    Comment: (NOTE) SARS-CoV-2 target nucleic acids are NOT DETECTED.  The SARS-CoV-2 RNA is generally detectable in upper respiratory specimens during the acute phase of infection. The lowest concentration of SARS-CoV-2 viral copies this assay can detect is 138 copies/mL. A negative result does not preclude SARS-Cov-2 infection and should not be used as the sole basis for treatment or other patient management decisions.  A negative result may occur with  improper specimen collection/handling, submission of specimen other than nasopharyngeal swab, presence of viral mutation(s) within the areas targeted by this assay, and inadequate number of viral copies(<138 copies/mL). A negative result must be combined with clinical observations, patient history, and epidemiological information. The expected result is Negative.  Fact Sheet for Patients:  EntrepreneurPulse.com.au  Fact Sheet for Healthcare Providers:  IncredibleEmployment.be  This test is no t yet approved or cleared by the [place] FDA and  has been authorized for detection and/or diagnosis of SARS-CoV-2 by FDA under an Emergency Use Authorization (EUA). This EUA will remain  in effect (meaning this test can be used) for the duration of the COVID-19 declaration under Section 564(b)(1) of the Act, 21 U.S.C.section 360bbb-3(b)(1), unless the authorization is terminated  or revoked sooner.       Influenza A by PCR NEGATIVE NEGATIVE Final   Influenza B by PCR NEGATIVE NEGATIVE Final    Comment: (NOTE) The Xpert Xpress SARS-CoV-2/FLU/RSV plus assay is intended as an aid in the diagnosis of influenza from Nasopharyngeal swab specimens and should not be used as a sole basis for treatment. Nasal washings and aspirates are unacceptable for Xpert Xpress SARS-CoV-2/FLU/RSV testing.  Fact Sheet for Patients: EntrepreneurPulse.com.au  Fact Sheet for Healthcare Providers: IncredibleEmployment.be  This test is not yet approved or cleared by the [place] FDA and has been authorized for detection and/or diagnosis of SARS-CoV-2 by FDA under an Emergency Use Authorization (EUA). This EUA will remain in effect (meaning this test can be used) for the duration of the COVID-19 declaration under Section 564(b)(1) of the Act, 21 U.S.C. section 360bbb-3(b)(1), unless the authorization  is terminated or revoked.  Performed at [facility], [place] [address] [place], [place] [address]   Blood Culture (routine x 2)     Status: Abnormal   Collection Time: 10/09/21 10:27 PM   Specimen: BLOOD  Result Value Ref Range Status   Specimen Description   Final    BLOOD BLOOD RIGHT HAND Performed at [facility] [address], [place], [place] [address]    Special Requests   Final    BOTTLES  DRAWN AEROBIC AND ANAEROBIC Blood Culture adequate volume Performed at [facility] [address] [place], [place] [address]    Culture  Setup Time   Final    GRAM POSITIVE COCCI ANAEROBIC BOTTLE ONLY CRITICAL RESULT CALLED TO, READ BACK BY AND VERIFIED WITH: PHARMD ELLEN JACKSON 10/11/21@1 :94 BY TW    Culture (A)  Final    STAPHYLOCOCCUS HOMINIS THE SIGNIFICANCE OF ISOLATING THIS ORGANISM FROM A SINGLE SET OF BLOOD CULTURES WHEN MULTIPLE SETS ARE DRAWN IS UNCERTAIN. PLEASE NOTIFY THE MICROBIOLOGY DEPARTMENT WITHIN ONE WEEK IF SPECIATION AND SENSITIVITIES ARE REQUIRED. Performed at [facility], [place] [address] [place], [place] [address]    Report Status 10/12/2021 FINAL  Final  Blood Culture ID Panel (Reflexed)     Status: Abnormal   Collection Time: 10/09/21 10:27 PM  Result Value Ref Range Status   Enterococcus faecalis NOT DETECTED NOT DETECTED Final   Enterococcus Faecium NOT DETECTED NOT DETECTED Final   Listeria monocytogenes NOT DETECTED NOT DETECTED Final   Staphylococcus species DETECTED (A) NOT DETECTED Final    Comment: CRITICAL RESULT CALLED TO, READ BACK BY AND VERIFIED WITH: PHARMD ELLEN JACKSON 10/11/21@1 :56 BY TW    Staphylococcus aureus (BCID) NOT DETECTED NOT DETECTED Final   Staphylococcus epidermidis NOT DETECTED NOT DETECTED Final   Staphylococcus lugdunensis NOT DETECTED NOT DETECTED Final   Streptococcus species NOT DETECTED NOT DETECTED Final   Streptococcus agalactiae NOT DETECTED NOT  DETECTED Final   Streptococcus pneumoniae NOT DETECTED NOT DETECTED Final   Streptococcus pyogenes NOT DETECTED NOT DETECTED Final   A.calcoaceticus-baumannii NOT DETECTED NOT DETECTED Final   Bacteroides fragilis NOT DETECTED NOT DETECTED Final   Enterobacterales NOT DETECTED NOT DETECTED Final   Enterobacter cloacae complex NOT DETECTED NOT DETECTED Final   Escherichia coli NOT DETECTED NOT DETECTED Final   Klebsiella aerogenes NOT DETECTED NOT DETECTED Final   Klebsiella oxytoca NOT DETECTED NOT DETECTED Final   Klebsiella pneumoniae NOT DETECTED NOT DETECTED Final   Proteus species NOT DETECTED NOT DETECTED Final   Salmonella species NOT DETECTED NOT DETECTED Final   Serratia marcescens NOT DETECTED NOT DETECTED Final   Haemophilus influenzae NOT DETECTED NOT DETECTED Final   Neisseria meningitidis NOT DETECTED NOT DETECTED Final   Pseudomonas aeruginosa NOT DETECTED NOT DETECTED Final   Stenotrophomonas maltophilia NOT DETECTED NOT DETECTED Final   Candida albicans NOT DETECTED NOT DETECTED Final   Candida auris NOT DETECTED NOT DETECTED Final   Candida glabrata NOT DETECTED NOT DETECTED Final   Candida krusei NOT DETECTED NOT DETECTED Final   Candida parapsilosis NOT DETECTED NOT DETECTED Final   Candida tropicalis NOT DETECTED NOT DETECTED Final   Cryptococcus neoformans/gattii NOT DETECTED NOT DETECTED Final    Comment: Performed at [facility], [place]. [address], [place], [place] [address]  Blood Culture (routine x 2)     Status: None (Preliminary result)   Collection Time: 10/09/21 10:59 PM   Specimen: BLOOD  Result Value Ref Range Status   Specimen Description   Final    BLOOD BLOOD RIGHT HAND Performed at [facility] [address], [place], [place] [address]    Special Requests   Final    BOTTLES DRAWN AEROBIC AND ANAEROBIC Blood Culture results may not be optimal due to an excessive volume of blood received in culture bottles Performed at  [facility] [address], [place], [place] [address]    Culture   Final  NO GROWTH 2 DAYS Performed at [facility], [place] [address] [place], [place] [address]    Report Status PENDING  Incomplete  MRSA Next Gen by PCR, Nasal     Status: None   Collection Time: 10/10/21  2:02 PM  Result Value Ref Range Status   MRSA by PCR Next Gen NOT DETECTED NOT DETECTED Final    Comment: (NOTE) The GeneXpert MRSA Assay (FDA approved for NASAL specimens only), is one component of a comprehensive MRSA colonization surveillance program. It is not intended to diagnose MRSA infection nor to guide or monitor treatment for MRSA infections. Test performance is not FDA approved in patients less than 36 years old. Performed at [facility], [place] [address] [name], [place] [address]   Urine Culture     Status: None   Collection Time: 10/10/21  2:33 PM   Specimen: In/Out Cath Urine  Result Value Ref Range Status   Specimen Description   Final    IN/OUT CATH URINE Performed at [facility] [address] [place], [place] [address]    Special Requests   Final    NONE Performed at [facility], [place] [address] [place], [place] [address]    Culture   Final    NO GROWTH Performed at [facility], [place] [address], [name], [place] [address]    Report Status 10/11/2021 FINAL  Final      Radiology Studies: VAS [place] IVC/ILIAC (VENOUS ONLY)  Result Date: 10/12/2021 a  IVC/ILIAC STUDY Patient Name:  Holly Bradley  Date of Exam:   10/12/2021 Medical Rec #: [id number]         Accession #:    [id number] Date of Birth: 04-Sep-1976         Patient Gender: F Patient Age:   52 years Exam Location:  [facility] Procedure:      VAS [place] IVC/ILIAC (VENOUS ONLY) Referring Phys: --------------------------------------------------------------------------------  Limitations: Known massive uterine mass displacing anatomical  structures and limiting visualization.  Comparison Study: 05/02/2021- negative lower extremity venous duplex Performing Technologist: Maudry Mayhew MHA, RDMS, RVT, RDCS  Examination Guidelines: A complete evaluation includes B-mode imaging, spectral Doppler, color Doppler, and power Doppler as needed of all accessible portions of each vessel. Bilateral testing is considered an integral part of a complete examination. Limited examinations for reoccurring indications may be performed as noted.  IVC/Iliac Findings: +----------+------+--------+------------------------------+    IVC    PatentThrombus           Comments            +----------+------+--------+------------------------------+ IVC Prox                Unable to adequately visualize +----------+------+--------+------------------------------+ IVC Mid                 Unable to adequately visualize +----------+------+--------+------------------------------+ IVC Distalpatent                                       +----------+------+--------+------------------------------+  +----------------+---------+-----------+---------+-----------------+--------+       CIV       RT-PatentRT-ThrombusLT-Patent   LT-Thrombus   Comments +----------------+---------+-----------+---------+-----------------+--------+ Common Iliac Mid                             age indeterminate         +----------------+---------+-----------+---------+-----------------+--------+  +--------------------+---------+-----------+---------+----------------+--------+  EIV         RT-PatentRT-ThrombusLT-Patent  LT-Thrombus   Comments +--------------------+---------+-----------+---------+----------------+--------+ External Iliac Vein                                    age                Mid                                               indeterminate           +--------------------+---------+-----------+---------+----------------+--------+    Summary: IVC/Iliac: There is evidence of age indeterminate thrombus involving the left common iliac vein. There is evidence of age indeterminate thrombus involving the left external iliac vein. Distal IVC is patent, although appears potentially compressed. Visualization of proximal Inferior Vena Cava and mid inferior vena cava was limited. Unable to visualize the right common iliac and external iliac veins due to uterine mass.  *See table(s) above for measurements and observations.   Preliminary    VAS [place] LOWER EXTREMITY VENOUS (DVT)  Result Date: 10/12/2021  Lower Venous DVT Study Patient Name:  Holly Bradley  Date of Exam:   10/12/2021 Medical Rec #: [id number]         Accession #:    [id number] Date of Birth: 1976/11/24         Patient Gender: F Patient Age:   28 years Exam Location:  [facility] Procedure:      VAS [place] LOWER EXTREMITY VENOUS (DVT) Referring Phys: Jalasia Eskridge --------------------------------------------------------------------------------  Indications: Edema.  Comparison Study: 05/02/2020- negative lower extremity venous duplex Performing Technologist: Maudry Mayhew MHA, RDMS, RVT, RDCS  Examination Guidelines: A complete evaluation includes B-mode imaging, spectral Doppler, color Doppler, and power Doppler as needed of all accessible portions of each vessel. Bilateral testing is considered an integral part of a complete examination. Limited examinations for reoccurring indications may be performed as noted. The reflux portion of the exam is performed with the patient in reverse Trendelenburg.  +-----+---------------+---------+-----------+----------+--------------+ RIGHTCompressibilityPhasicitySpontaneityPropertiesThrombus Aging +-----+---------------+---------+-----------+----------+--------------+ CFV  Full           Yes                 patent                   +-----+---------------+---------+-----------+----------+--------------+    +---------+---------------+---------+-----------+----------+-------------------+ LEFT     CompressibilityPhasicitySpontaneityPropertiesThrombus Aging      +---------+---------------+---------+-----------+----------+-------------------+ CFV      None                    No                   Age Indeterminate   +---------+---------------+---------+-----------+----------+-------------------+ SFJ      None                                         Age Indeterminate   +---------+---------------+---------+-----------+----------+-------------------+ FV Prox  Full                               patent                        +---------+---------------+---------+-----------+----------+-------------------+  FV Mid   None                                         Age Indeterminate   +---------+---------------+---------+-----------+----------+-------------------+ FV DistalFull                               patent                        +---------+---------------+---------+-----------+----------+-------------------+ PFV      Full                               patent                        +---------+---------------+---------+-----------+----------+-------------------+ POP      Full           No       Yes        patent                        +---------+---------------+---------+-----------+----------+-------------------+ PTV      Full                               patent                        +---------+---------------+---------+-----------+----------+-------------------+ PERO     Full                               patent                        +---------+---------------+---------+-----------+----------+-------------------+ EIV                              Yes                  partial age                                                               indeterminate                                                             thrombus             +---------+---------------+---------+-----------+----------+-------------------+ CIV                              No                   Age Indeterminate   +---------+---------------+---------+-----------+----------+-------------------+  Left Technical Findings: Not visualized segments include Limited visualization  of PTV and peroneal veins.   Summary: RIGHT: - No evidence of common femoral vein obstruction.  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  *See table(s) above for measurements and observations.    Preliminary     Scheduled Meds:  amiodarone  200 mg Oral Daily   atorvastatin  40 mg Oral Daily   baclofen  20 mg Oral TID   carvedilol  25 mg Oral BID   Chlorhexidine Gluconate Cloth  6 each Topical Daily   cloNIDine  0.1 mg Oral TID   feeding supplement  237 mL Oral TID BM   hydrALAZINE  100 mg Oral TID   isosorbide mononitrate  60 mg Oral Daily   mouth rinse  15 mL Mouth Rinse BID   megestrol  40 mg Oral BID   nystatin  5 mL Oral QID   sacubitril-valsartan  1 tablet Oral BID   Continuous Infusions:  sodium chloride 10 mL/hr at 10/12/21 1506   ceFEPime (MAXIPIME) IV Stopped (10/12/21 1349)   vancomycin Stopped (10/11/21 2334)     LOS: 2 days   Marylu Lund, MD [facility] Pager On Amion  If 7PM-7AM, please contact night-coverage 10/12/2021, 4:22 PM

## 2021-10-12 NOTE — Progress Notes (Signed)
Left lower extremity venous duplex and IVC/iliac vein duplex completed. Refer to "CV Proc" under chart review to view preliminary results.  10/12/2021 1:58 PM Kelby Aline., MHA, RVT, RDCS, RDMS

## 2021-10-13 DIAGNOSIS — D62 Acute posthemorrhagic anemia: Secondary | ICD-10-CM | POA: Diagnosis not present

## 2021-10-13 DIAGNOSIS — L8990 Pressure ulcer of unspecified site, unspecified stage: Secondary | ICD-10-CM | POA: Insufficient documentation

## 2021-10-13 DIAGNOSIS — G8194 Hemiplegia, unspecified affecting left nondominant side: Secondary | ICD-10-CM | POA: Diagnosis not present

## 2021-10-13 DIAGNOSIS — D259 Leiomyoma of uterus, unspecified: Secondary | ICD-10-CM | POA: Diagnosis not present

## 2021-10-13 DIAGNOSIS — R140 Abdominal distension (gaseous): Secondary | ICD-10-CM | POA: Diagnosis not present

## 2021-10-13 DIAGNOSIS — R760 Raised antibody titer: Secondary | ICD-10-CM | POA: Diagnosis not present

## 2021-10-13 DIAGNOSIS — N921 Excessive and frequent menstruation with irregular cycle: Secondary | ICD-10-CM | POA: Diagnosis not present

## 2021-10-13 LAB — COMPREHENSIVE METABOLIC PANEL
ALT: 11 U/L (ref 0–44)
AST: 18 U/L (ref 15–41)
Albumin: 2.9 g/dL — ABNORMAL LOW (ref 3.5–5.0)
Alkaline Phosphatase: 97 U/L (ref 38–126)
Anion gap: 8 (ref 5–15)
BUN: 18 mg/dL (ref 6–20)
CO2: 17 mmol/L — ABNORMAL LOW (ref 22–32)
Calcium: 9.8 mg/dL (ref 8.9–10.3)
Chloride: 112 mmol/L — ABNORMAL HIGH (ref 98–111)
Creatinine, Ser: 0.6 mg/dL (ref 0.44–1.00)
GFR, Estimated: >60 mL/min (ref >60)
Glucose, Bld: 81 mg/dL (ref 70–99)
Potassium: 4 mmol/L (ref 3.5–5.1)
Sodium: 137 mmol/L (ref 135–145)
Total Bilirubin: 0.7 mg/dL (ref 0.3–1.2)
Total Protein: 6.5 g/dL (ref 6.5–8.1)

## 2021-10-13 LAB — BPAM RBC
Blood Product Expiration Date: 202211182359
Blood Product Expiration Date: 202211182359
Blood Product Expiration Date: 202211182359
Blood Product Expiration Date: 202211202359
Blood Product Expiration Date: 202211222359
Blood Product Expiration Date: 202211222359
ISSUE DATE / TIME: 202210182344
ISSUE DATE / TIME: 202210190130
ISSUE DATE / TIME: 202210190332
ISSUE DATE / TIME: 202210190600
ISSUE DATE / TIME: 202210210735
Unit Type and Rh: 5100
Unit Type and Rh: 5100
Unit Type and Rh: 5100
Unit Type and Rh: 5100
Unit Type and Rh: 9500
Unit Type and Rh: 9500

## 2021-10-13 LAB — TYPE AND SCREEN
ABO/RH(D): O POS
Antibody Screen: NEGATIVE
Donor AG Type: NEGATIVE
Donor AG Type: NEGATIVE
Donor AG Type: NEGATIVE
Donor AG Type: NEGATIVE
Unit division: 0
Unit division: 0
Unit division: 0
Unit division: 0
Unit division: 0
Unit division: 0

## 2021-10-13 LAB — CBC
HCT: 29.9 % — ABNORMAL LOW (ref 36.0–46.0)
Hemoglobin: 9.3 g/dL — ABNORMAL LOW (ref 12.0–15.0)
MCH: 26.8 pg (ref 26.0–34.0)
MCHC: 31.1 g/dL (ref 30.0–36.0)
MCV: 86.2 fL (ref 80.0–100.0)
Platelets: 287 10*3/uL (ref 150–400)
RBC: 3.47 MIL/uL — ABNORMAL LOW (ref 3.87–5.11)
RDW: 19.1 % — ABNORMAL HIGH (ref 11.5–15.5)
WBC: 7.4 10*3/uL (ref 4.0–10.5)
nRBC: 0.4 % — ABNORMAL HIGH (ref 0.0–0.2)

## 2021-10-13 MED ORDER — ALPRAZOLAM 0.25 MG PO TABS
0.2500 mg | ORAL_TABLET | Freq: Once | ORAL | Status: AC
  Administered 2021-10-13: 0.25 mg via ORAL
  Filled 2021-10-13: qty 1

## 2021-10-13 NOTE — Progress Notes (Signed)
Daily Progress Note   [name]       Date: 10/13/2021 DOB: Nov 20, 1976  Age: 45 y.o. MRN#: [id number] Attending Physician: Donne Hazel, MD Primary Care Physician: Mitzi Hansen, MD Admit Date: 10/09/2021  Reason for Consultation/Follow-up: Establishing goals of care  Subjective: I saw and examined Holly Bradley today.  She was awake and alert and lying in bed in no distress.  We discussed care plan and plan to transition to [facility] today for evaluation by neurology and interventional radiology.  She understands the basics about what is going on with her condition and bleeding as well as the need to balance the risks and benefits of medications and interventions moving forward.  She clearly indicates that she wants me to call her brother to discuss further.  I was able to call and reach her brother, Holly Bradley.  [name] tells me that he understands the situation and wants to move forward with plan to transition to comfort with hopes that there is an interventional radiology procedure that could be done that would potentially be more of a permanent "fix" to her continued menorrhagia.  We discussed plan to see the opinion of the rest of the specialists consulted for [facility] and then discuss goals of care if there is not an obvious choice for care moving forward after we have the opinion of all her subspecialists.  [name] asked me to share with Holly Bradley that he is not feeling well today but is planning to come and visit with her tomorrow.  Length of Stay: 3  Current Medications: Scheduled Meds:   amiodarone  200 mg Oral Daily   atorvastatin  40 mg Oral Daily   baclofen  20 mg Oral TID   carvedilol  25 mg Oral BID   Chlorhexidine Gluconate Cloth  6 each Topical Daily   cloNIDine   0.1 mg Oral TID   feeding supplement  237 mL Oral TID BM   hydrALAZINE  100 mg Oral TID   isosorbide mononitrate  60 mg Oral Daily   mouth rinse  15 mL Mouth Rinse BID   megestrol  40 mg Oral BID   nystatin  5 mL Oral QID   sacubitril-valsartan  1 tablet Oral BID    Continuous Infusions:  sodium chloride 10 mL/hr at 10/12/21 1506  PRN Meds: sodium chloride, acetaminophen **OR** acetaminophen, bisacodyl, hydrALAZINE, traMADol  Physical Exam   General: Alert, awake, in no acute distress.   HEENT: No bruits, no goiter, no JVD Heart: Regular rate and rhythm. No murmur appreciated. Lungs: Good air movement, clear Abdomen: Soft, nontender, nondistended, positive bowel sounds.   Ext: No significant edema Skin: Warm and dry Neuro: Hemiaplegia, slow speech.         Vital Signs: BP (!) 117/59   Pulse 74   Temp 98.3 F (36.8 C) (Axillary)   Resp 12   Ht 5\' 5"  (1.651 m)   Wt 57.5 kg   LMP  (LMP Unknown)   SpO2 100%   BMI 21.09 kg/m  SpO2: SpO2: 100 % O2 Device: O2 Device: (P) Room Air O2 Flow Rate:    Intake/output summary:  Intake/Output Summary (Last 24 hours) at 10/13/2021 1517 Last data filed at 10/13/2021 0530 Gross per 24 hour  Intake --  Output 900 ml  Net -900 ml   LBM: Last BM Date: 10/10/21 Baseline Weight: Weight: 68 kg Most recent weight: Weight: 57.5 kg       Palliative Assessment/Data:    Flowsheet Rows    Flowsheet Row Most Recent Value  Intake Tab   Referral Department Hospitalist  Unit at Time of Referral ICU  Palliative Care Primary Diagnosis Neurology  Date Notified 10/12/21  Palliative Care Type Return patient Palliative Care  Reason for referral Clarify Goals of Care  Date of Admission 10/09/21  Date first seen by Palliative Care 10/12/21  # of days Palliative referral response time 0 Day(s)  # of days IP prior to Palliative referral 3  Clinical Assessment   Palliative Performance Scale Score 40%  Psychosocial & Spiritual  Assessment   Palliative Care Outcomes   Patient/Family meeting held? 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)    Acute blood loss anemia 10/10/2021   Hypertension    Non-ischemic cardiomyopathy (HCC)    Swelling of left hand    Proctocolitis    Constipation    Uterine fibroid    Left hemiparesis (HCC)    Hemorrhagic stroke (HCC)    Abnormal uterine bleeding (AUB)    Staphylococcus epidermidis bacteremia    Iron deficiency anemia due to chronic blood loss 05/25/2021   Cerebral edema (HCC) 05/25/2021   Chronic HFrEF (heart failure with reduced ejection fraction) ([place]) 05/25/2021   Pneumonia due to methicillin susceptible Staphylococcus aureus ([place]) 05/25/2021   Acute respiratory failure with hypoxia (HCC)    Acute right MCA stroke ([place]) 05/15/2021   Uncontrolled hypertension 05/15/2021   Cerebrovascular accident (CVA) ([place]) 05/15/2021   IDA (iron deficiency anemia) 03/01/2021   Healthcare maintenance 03/01/2021   Onychomycosis 03/01/2021   Food insecurity 02/02/2021   Psychosis ([place])    Fibroid 01/11/2021   Menorrhagia 01/11/2021   Acute exacerbation of CHF (congestive heart failure) ([place]) 01/10/2021   AKI (acute kidney injury) ([place])    SVT (supraventricular tachycardia) ([place]) 05/01/2020   CHF (congestive heart failure) ([place]) 05/01/2020    Palliative Care Assessment & Plan   Patient Profile: 45 y.o. female  with past medical history of CVA with left-sided hemiplegia, SVT, CHF, chronic anemia, hypertension, fibroids admitted on 10/09/2021 with anemia and continuous menorrhagia.  Her situation is difficult as she is  on anticoagulation with Eliquis due to stroke but is having significant blood loss.  She was evaluated by gynecology and oncology.  Still awaiting transfer to [facility] to obtain input from neurology and IR.   Palliative consulted for goals of care.    Recommendations/Plan: No CPR in the event of cardiac arrest This remains a complicated situation.  I discussed today with the patient's brother/HC POA, [name].  We discussed plan to await transfer to [facility] and have input from neuro and IR.  Holly Bradley is hopeful she will be a candidate for [place].  If not, we will plan to discuss with [place] the risks and benefits of options for her care moving forward.  She defers this to Holly Bradley, but he wants her to be part of any conversations. Palliative to continue to follow.  Code Status:    Code Status Orders  (From admission, onward)           Start     Ordered   10/10/21 0313  Limited resuscitation (code)  Continuous       Question Answer Comment  In the event of cardiac or respiratory ARREST: Initiate Code Blue, Call Rapid Response Yes   In the event of cardiac or respiratory ARREST: Perform CPR No   In the event of cardiac or respiratory ARREST: Perform Intubation/Mechanical Ventilation Yes   In the event of cardiac or respiratory ARREST: Use NIPPV/BiPAp only if indicated Yes   In the event of cardiac or respiratory ARREST: Administer ACLS medications if indicated Yes   In the event of cardiac or respiratory ARREST: Perform Defibrillation or Cardioversion if indicated Yes      10/10/21 0313           Code Status History     Date Active Date Inactive Code Status Order ID Comments User Context   05/30/2021 1101 07/04/2021 0522 Partial Code [id number]  Philis Pique, NP Inpatient   05/15/2021 0754 05/30/2021 1101 Full Code [id number]  Greta Doom, MD ED   02/01/2021 1644 02/02/2021 2005 Full Code [id number]  Ledora Bottcher, [place] [facility]   01/10/2021 1455 01/27/2021 2035 Full Code [id number]  Marty Heck, DO ED   05/01/2020 1644 05/03/2020 1813 Full Code [id number]  Lequita Halt, MD ED       Prognosis: Guarded  Discharge Planning: To be determined  Care plan was discussed with patient, RN,  brother via phone  Thank you for allowing the Palliative Medicine Team to assist in the care of this patient.   Time In: 0915 Time Out: 1000 Total Time 45 Prolonged Time Billed No      Greater than 50%  of this time was spent counseling and coordinating care related to the above assessment and plan.  Micheline Rough, MD  Please contact Palliative Medicine Team phone at [phone] for questions and concerns.

## 2021-10-13 NOTE — Progress Notes (Signed)
PROGRESS NOTE    Anahid Eskelson  XNA:[id number] DOB: 1976/11/10 DOA: 10/09/2021 PCP: Mitzi Hansen, MD    Brief Narrative:  45 y.o. female with history of CVA with hx of fibroids, left-sided hemiplegia, SVT, CHF, chronic anemia, hypertension was brought to the ER after patient's hemoglobin was found to be low and patient also has been having continuous menorrhagia.  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 Difficult situation where pt is deemed not a surgical candidate given co morbidities. Gyn recs for either Mirena IUD vs [place] if rebleeding  Earlier discussed with IR who recommends fibroid embolization next week with transfer to [facility] currently pending. Have updated Gyn regarding transfer and their service will be on standby for now Will recheck CBC in AM Fever with concern developing sepsis for which blood cultures were obtained and started on empiric antibiotics. UA notable only for blood, correlating to presenting menorrhagia CXR unremarkable Blood cx only pos for 1/2 staph hominis, likely contaminant Empiric abx have been discontinued Uncontrolled hypertension Was continued on clonidine hydralazine coverage on Imdur Entresto  Continue with IV hydralazine as needed Prior history of CVA with left-sided hemiplegia. Chart reviewed. Very difficult situation with concerns for suspected  underlying hypercoag state Pt was previously on ASA, which she tolerated. Found to be hypercoagulable with Neurology later prescribing eliquis prior to onset of above menorrhagia Eliquis was held secondary to presenting menorrhagia above [place] Hematology input on suspected hypercoag state with repeat hypercoag panel pending Given concerns of significant acute blood loss anemia, consult placed for Neurology/Stroke team to assess risk/benefits to anticoagulation LLE DVT Large DVT in LLE reviewed on doppler Discussed with radiology. DVT likely secondary to compressive mass effect of large uterus Have ordered CTA abd/pelvis per IR recs to better assess flow History of CHF  Tolerated recent PRBC transfusion Recheckt cmp in AM History of SVT on amiodarone. Hemodynamically stable   DVT prophylaxis: SCD's Code Status: No CPR Family Communication: Pt in room  Status is: Inpatient  Remains inpatient appropriate because: acute illness needing IR procedure    Consultants:  Ob/Gyn Hematology IR Neurology/Stroke Palliative Care  Procedures:    Antimicrobials: Anti-infectives (From admission, onward)    Start     Dose/Rate Route Frequency Ordered Stop   10/11/21 1200  ceFEPIme (MAXIPIME) 2 g in sodium chloride 0.9 % 100 mL IVPB  Status:  Discontinued        2 g 200 mL/hr over 30 Minutes Intravenous Every 8 hours 10/11/21 1018 10/12/21 2030   10/10/21 2200  vancomycin (VANCOREADY) IVPB 1250 mg/250 mL  Status:  Discontinued        1,250 mg 166.7 mL/hr over 90 Minutes Intravenous Every 24 hours 10/10/21 0402 10/12/21 2030   10/10/21 1200  ceFEPIme (MAXIPIME) 2 g in sodium chloride 0.9 % 100 mL IVPB  Status:  Discontinued        2 g 200 mL/hr over  30 Minutes Intravenous Every 12 hours 10/10/21 0402 10/11/21 1018   10/09/21 2345  ceFEPIme (MAXIPIME) 2 g in sodium chloride 0.9 % 100 mL IVPB        2 g 200 mL/hr over 30 Minutes Intravenous  Once 10/09/21 2343 10/10/21 0046   10/09/21  2345  metroNIDAZOLE (FLAGYL) IVPB 500 mg        500 mg 100 mL/hr over 60 Minutes Intravenous  Once 10/09/21 2343 10/10/21 0135   10/09/21 2345  vancomycin (VANCOCIN) IVPB 1000 mg/200 mL premix        1,000 mg 200 mL/hr over 60 Minutes Intravenous  Once 10/09/21 2343 10/10/21 0135       Subjective: Reports feeling cold this AM  Objective: Vitals:   10/13/21 1000 10/13/21 1100 10/13/21 1200 10/13/21 1300  BP:   (!) 161/83 (!) 117/59  Pulse: (!) 57 80 82 74  Resp: (!) 8 11 12 12   Temp:   98.3 F (36.8 C)   TempSrc:   Axillary   SpO2: 100% 100% 100% 100%  Weight:      Height:        Intake/Output Summary (Last 24 hours) at 10/13/2021 1513 Last data filed at 10/13/2021 0530 Gross per 24 hour  Intake --  Output 900 ml  Net -900 ml    Filed Weights   10/09/21 2215 10/10/21 0200  Weight: 68 kg 57.5 kg    Examination: General exam: Awake, laying in bed, in nad Respiratory system: Normal respiratory effort, no wheezing Cardiovascular system: regular rate, s1, s2 Gastrointestinal system: Soft, nondistended, positive BS Central nervous system: CN2-12 grossly intact, strength intact Extremities: Perfused, no clubbing Skin: Normal skin turgor, no notable skin lesions seen Psychiatry: Mood normal // no visual hallucinations   Data Reviewed: I have personally reviewed following labs and imaging studies  CBC: Recent Labs  Lab 10/09/21 2241 10/09/21 2258 10/10/21 0003 10/10/21 0018 10/10/21 1244 10/11/21 0229 10/11/21 1349 10/12/21 0247 10/13/21 0715  WBC 9.1 9.2  --   --  9.0 6.0  --  8.2 7.4  NEUTROABS 7.4 7.7  --   --   --   --   --   --   --   HGB 3.0* 2.9*   < >  --  11.1* 8.1* 9.1* 9.9* 9.3*  HCT 12.0* 11.1*   < >  --  35.1* 26.5* 28.7* 32.8* 29.9*  MCV 84.5 80.4  --   --  87.3 86.9  --  87.5 86.2  PLT 336 336  --  331 332 247  --  247 287   < > = values in this interval not displayed.    Basic Metabolic Panel: Recent Labs  Lab 10/09/21 2241  10/11/21 0229 10/12/21 0247 10/13/21 0715  NA 140 138 137 137  K 3.9 3.8 4.3 4.0  CL 111 110 109 112*  CO2 20* 22 21* 17*  GLUCOSE 98 99 95 81  BUN 20 17 19 18   CREATININE 1.14* 0.84 0.77 0.60  CALCIUM 9.3 9.1 9.4 9.8    GFR: Estimated Creatinine Clearance: 78.2 mL/min (by C-G formula based on SCr of 0.6 mg/dL). Liver Function Tests: Recent Labs  Lab 10/09/21 2241 10/11/21 0229 10/12/21 0247 10/13/21 0715  AST 17 11* 25 18  ALT 10 8 11 11   ALKPHOS 104 90 105 97  BILITOT 0.6 1.0 0.8 0.7  PROT 7.0 6.1* 7.0 6.5  ALBUMIN 3.3* 2.9* 3.0* 2.9*    No results for input(s): LIPASE, AMYLASE  in the last 168 hours. No results for input(s): AMMONIA in the last 168 hours. Coagulation Profile: Recent Labs  Lab 10/09/21 2300 10/10/21 0018 10/10/21 1244  INR 1.7* 1.7* 1.3*    Cardiac Enzymes: No results for input(s): CKTOTAL, CKMB, CKMBINDEX, TROPONINI in the last 168 hours. BNP (last 3 results) No results for input(s): PROBNP in the last 8760 hours. HbA1C: No results for input(s): HGBA1C in the last 72 hours. CBG: Recent Labs  Lab 10/10/21 0144  GLUCAP 113*    Lipid Profile: No results for input(s): CHOL, HDL, LDLCALC, TRIG, CHOLHDL, LDLDIRECT in the last 72 hours. Thyroid Function Tests: No results for input(s): TSH, T4TOTAL, FREET4, T3FREE, THYROIDAB in the last 72 hours. Anemia Panel: No results for input(s): VITAMINB12, FOLATE, FERRITIN, TIBC, IRON, RETICCTPCT in the last 72 hours.  Sepsis Labs: Recent Labs  Lab 10/09/21 2241 10/09/21 2259  LATICACIDVEN 0.6 0.6     Recent Results (from the past 240 hour(s))  Resp Panel by RT-PCR (Flu A&B, Covid) Nasopharyngeal Swab     Status: None   Collection Time: 10/09/21  9:52 PM   Specimen: Nasopharyngeal Swab; Nasopharyngeal(NP) swabs in vial transport medium  Result Value Ref Range Status   SARS Coronavirus 2 by RT PCR NEGATIVE NEGATIVE Final    Comment: (NOTE) SARS-CoV-2 target nucleic acids are NOT  DETECTED.  The SARS-CoV-2 RNA is generally detectable in upper respiratory specimens during the acute phase of infection. The lowest concentration of SARS-CoV-2 viral copies this assay can detect is 138 copies/mL. A negative result does not preclude SARS-Cov-2 infection and should not be used as the sole basis for treatment or other patient management decisions. A negative result may occur with  improper specimen collection/handling, submission of specimen other than nasopharyngeal swab, presence of viral mutation(s) within the areas targeted by this assay, and inadequate number of viral copies(<138 copies/mL). A negative result must be combined with clinical observations, patient history, and epidemiological information. The expected result is Negative.  Fact Sheet for Patients:  EntrepreneurPulse.com.au  Fact Sheet for Healthcare Providers:  IncredibleEmployment.be  This test is no t yet approved or cleared by the [place] FDA and  has been authorized for detection and/or diagnosis of SARS-CoV-2 by FDA under an Emergency Use Authorization (EUA). This EUA will remain  in effect (meaning this test can be used) for the duration of the COVID-19 declaration under Section 564(b)(1) of the Act, 21 U.S.C.section 360bbb-3(b)(1), unless the authorization is terminated  or revoked sooner.       Influenza A by PCR NEGATIVE NEGATIVE Final   Influenza B by PCR NEGATIVE NEGATIVE Final    Comment: (NOTE) The Xpert Xpress SARS-CoV-2/FLU/RSV plus assay is intended as an aid in the diagnosis of influenza from Nasopharyngeal swab specimens and should not be used as a sole basis for treatment. Nasal washings and aspirates are unacceptable for Xpert Xpress SARS-CoV-2/FLU/RSV testing.  Fact Sheet for Patients: EntrepreneurPulse.com.au  Fact Sheet for Healthcare Providers: IncredibleEmployment.be  This test is not yet  approved or cleared by the [place] FDA and has been authorized for detection and/or diagnosis of SARS-CoV-2 by FDA under an Emergency Use Authorization (EUA). This EUA will remain in effect (meaning this test can be used) for the duration of the COVID-19 declaration under Section 564(b)(1) of the Act, 21 U.S.C. section 360bbb-3(b)(1), unless the authorization is terminated or revoked.  Performed at [facility], [place] [address] [place], [place] [address]   Blood Culture (routine x 2)  Status: Abnormal   Collection Time: 10/09/21 10:27 PM   Specimen: BLOOD  Result Value Ref Range Status   Specimen Description   Final    BLOOD BLOOD RIGHT HAND Performed at [place] [facility] [address], [place], [place] [address]    Special Requests   Final    BOTTLES DRAWN AEROBIC AND ANAEROBIC Blood Culture adequate volume Performed at [place] [address], [place], [place] [address]    Culture  Setup Time   Final    GRAM POSITIVE COCCI ANAEROBIC BOTTLE ONLY CRITICAL RESULT CALLED TO, READ BACK BY AND VERIFIED WITH: PHARMD ELLEN JACKSON 10/11/21@1 :50 BY TW    Culture (A)  Final    STAPHYLOCOCCUS HOMINIS THE SIGNIFICANCE OF ISOLATING THIS ORGANISM FROM A SINGLE SET OF BLOOD CULTURES WHEN MULTIPLE SETS ARE DRAWN IS UNCERTAIN. PLEASE NOTIFY THE MICROBIOLOGY DEPARTMENT WITHIN ONE WEEK IF SPECIATION AND SENSITIVITIES ARE REQUIRED. Performed at [facility], [place] [address], [place],  [address]    Report Status 10/12/2021 FINAL  Final  Blood Culture ID Panel (Reflexed)     Status: Abnormal   Collection Time: 10/09/21 10:27 PM  Result Value Ref Range Status   Enterococcus faecalis NOT DETECTED NOT DETECTED Final   Enterococcus Faecium NOT DETECTED NOT DETECTED Final   Listeria monocytogenes NOT DETECTED NOT DETECTED Final   Staphylococcus species DETECTED (A) NOT DETECTED Final    Comment: CRITICAL RESULT CALLED  TO, READ BACK BY AND VERIFIED WITH: PHARMD ELLEN JACKSON 10/11/21@1 :56 BY TW    Staphylococcus aureus (BCID) NOT DETECTED NOT DETECTED Final   Staphylococcus epidermidis NOT DETECTED NOT DETECTED Final   Staphylococcus lugdunensis NOT DETECTED NOT DETECTED Final   Streptococcus species NOT DETECTED NOT DETECTED Final   Streptococcus agalactiae NOT DETECTED NOT DETECTED Final   Streptococcus pneumoniae NOT DETECTED NOT DETECTED Final   Streptococcus pyogenes NOT DETECTED NOT DETECTED Final   A.calcoaceticus-baumannii NOT DETECTED NOT DETECTED Final   Bacteroides fragilis NOT DETECTED NOT DETECTED Final   Enterobacterales NOT DETECTED NOT DETECTED Final   Enterobacter cloacae complex NOT DETECTED NOT DETECTED Final   Escherichia coli NOT DETECTED NOT DETECTED Final   Klebsiella aerogenes NOT DETECTED NOT DETECTED Final   Klebsiella oxytoca NOT DETECTED NOT DETECTED Final   Klebsiella pneumoniae NOT DETECTED NOT DETECTED Final   Proteus species NOT DETECTED NOT DETECTED Final   Salmonella species NOT DETECTED NOT DETECTED Final   Serratia marcescens NOT DETECTED NOT DETECTED Final   Haemophilus influenzae NOT DETECTED NOT DETECTED Final   Neisseria meningitidis NOT DETECTED NOT DETECTED Final   Pseudomonas aeruginosa NOT DETECTED NOT DETECTED Final   Stenotrophomonas maltophilia NOT DETECTED NOT DETECTED Final   Candida albicans NOT DETECTED NOT DETECTED Final   Candida auris NOT DETECTED NOT DETECTED Final   Candida glabrata NOT DETECTED NOT DETECTED Final   Candida krusei NOT DETECTED NOT DETECTED Final   Candida parapsilosis NOT DETECTED NOT DETECTED Final   Candida tropicalis NOT DETECTED NOT DETECTED Final   Cryptococcus neoformans/gattii NOT DETECTED NOT DETECTED Final    Comment: Performed at [facility], [place]. [address], [place],  [address]  Blood Culture (routine x 2)     Status: None (Preliminary result)   Collection Time: 10/09/21 10:59 PM   Specimen: BLOOD   Result Value Ref Range Status   Specimen Description   Final    BLOOD BLOOD RIGHT HAND Performed at [facility] [place] [place],  [place] [address]    Special Requests   Final    BOTTLES DRAWN AEROBIC AND ANAEROBIC Blood Culture results may not be optimal due to an excessive volume of blood received in culture bottles Performed at [place] [address], [place], [place] [address]    Culture   Final    NO GROWTH 3 DAYS Performed at [facility], [place] [address] [place], [place] [address]    Report Status PENDING  Incomplete  MRSA Next Gen by PCR, Nasal     Status: None   Collection Time: 10/10/21  2:02 PM  Result Value Ref Range Status   MRSA by PCR Next Gen NOT DETECTED NOT DETECTED Final    Comment: (NOTE) The GeneXpert MRSA Assay (FDA approved for NASAL specimens only), is one component of a comprehensive MRSA colonization surveillance program. It is not intended to diagnose MRSA infection nor to guide or monitor treatment for MRSA infections. Test performance is not FDA approved in patients less than 34 years old. Performed at [facility], [place] [address], [place], [place] [address]   Urine Culture     Status: None   Collection Time: 10/10/21  2:33 PM   Specimen: In/Out Cath Urine  Result Value Ref Range Status   Specimen Description   Final    IN/OUT CATH URINE Performed at [facility] [address] [place], [place] [address]    Special Requests   Final    NONE Performed at [facility], [place] [address] [place], [place] [address]    Culture   Final    NO GROWTH Performed at [facility], [place] [address] [place], [place] [address]    Report Status 10/11/2021 FINAL  Final      Radiology Studies: VAS [place] IVC/ILIAC (VENOUS ONLY)  Result Date: 10/13/2021 a  IVC/ILIAC STUDY Patient Name:  MELODIE ASHWORTH  Date of Exam:   10/12/2021 Medical  Rec #: [id number]         Accession #:    [id number] Date of Birth: 12/16/1976         Patient Gender: F Patient Age:   26 years Exam Location:  [facility] Procedure:      VAS [place] IVC/ILIAC (VENOUS ONLY) Referring Phys: --------------------------------------------------------------------------------  Limitations: Known massive uterine mass displacing anatomical structures and limiting visualization.  Comparison Study: 05/02/2021- negative lower extremity venous duplex Performing Technologist: Maudry Mayhew MHA, RDMS, RVT, RDCS  Examination Guidelines: A complete evaluation includes B-mode imaging, spectral Doppler, color Doppler, and power Doppler as needed of all accessible portions of each vessel. Bilateral testing is considered an integral part of a complete examination. Limited examinations for reoccurring indications may be performed as noted.  IVC/Iliac Findings: +----------+------+--------+------------------------------+    IVC    PatentThrombus           Comments            +----------+------+--------+------------------------------+ IVC Prox                Unable to adequately visualize +----------+------+--------+------------------------------+ IVC Mid                 Unable to adequately visualize +----------+------+--------+------------------------------+ IVC Distalpatent                                       +----------+------+--------+------------------------------+  +----------------+---------+-----------+---------+-----------------+--------+  CIV       RT-PatentRT-ThrombusLT-Patent   LT-Thrombus   Comments +----------------+---------+-----------+---------+-----------------+--------+ Common Iliac Mid                             age indeterminate         +----------------+---------+-----------+---------+-----------------+--------+  +--------------------+---------+-----------+---------+----------------+--------+         EIV          RT-PatentRT-ThrombusLT-Patent  LT-Thrombus   Comments +--------------------+---------+-----------+---------+----------------+--------+ External Iliac Vein                                    age                Mid                                               indeterminate           +--------------------+---------+-----------+---------+----------------+--------+   Summary: IVC/Iliac: There is evidence of age indeterminate thrombus involving the left common iliac vein. There is evidence of age indeterminate thrombus involving the left external iliac vein. Distal IVC is patent, although appears potentially compressed. Visualization of proximal Inferior Vena Cava and mid inferior vena cava was limited. Unable to visualize the right common iliac and external iliac veins due to uterine mass.  *See table(s) above for measurements and observations.  Electronically signed by Deitra Mayo MD on 10/13/2021 at 5:15:17 AM.    Final    VAS [place] LOWER EXTREMITY VENOUS (DVT)  Result Date: 10/13/2021  Lower Venous DVT Study Patient Name:  JOHNANNA BAKKE  Date of Exam:   10/12/2021 Medical Rec #: [id number]         Accession #:    [id number] Date of Birth: Jul 27, 1976         Patient Gender: F Patient Age:   68 years Exam [facility] Procedure:      VAS [place] LOWER EXTREMITY VENOUS (DVT) Referring Phys: Tiegan Jambor --------------------------------------------------------------------------------  Indications: Edema.  Comparison Study: 05/02/2020- negative lower extremity venous duplex Performing Technologist: Maudry Mayhew MHA, RDMS, RVT, RDCS  Examination Guidelines: A complete evaluation includes B-mode imaging, spectral Doppler, color Doppler, and power Doppler as needed of all accessible portions of each vessel. Bilateral testing is considered an integral part of a complete examination. Limited examinations for reoccurring indications may be performed as noted. The reflux portion  of the exam is performed with the patient in reverse Trendelenburg.  +-----+---------------+---------+-----------+----------+--------------+ RIGHTCompressibilityPhasicitySpontaneityPropertiesThrombus Aging +-----+---------------+---------+-----------+----------+--------------+ CFV  Full           Yes                 patent                   +-----+---------------+---------+-----------+----------+--------------+   +---------+---------------+---------+-----------+----------+-------------------+ LEFT     CompressibilityPhasicitySpontaneityPropertiesThrombus Aging      +---------+---------------+---------+-----------+----------+-------------------+ CFV      None                    No                   Age Indeterminate   +---------+---------------+---------+-----------+----------+-------------------+ SFJ      None  Age Indeterminate   +---------+---------------+---------+-----------+----------+-------------------+ FV Prox  Full                               patent                        +---------+---------------+---------+-----------+----------+-------------------+ FV Mid   None                                         Age Indeterminate   +---------+---------------+---------+-----------+----------+-------------------+ FV DistalFull                               patent                        +---------+---------------+---------+-----------+----------+-------------------+ PFV      Full                               patent                        +---------+---------------+---------+-----------+----------+-------------------+ POP      Full           No       Yes        patent                        +---------+---------------+---------+-----------+----------+-------------------+ PTV      Full                               patent                         +---------+---------------+---------+-----------+----------+-------------------+ PERO     Full                               patent                        +---------+---------------+---------+-----------+----------+-------------------+ EIV                              Yes                  partial age                                                               indeterminate                                                             thrombus            +---------+---------------+---------+-----------+----------+-------------------+  CIV                              No                   Age Indeterminate   +---------+---------------+---------+-----------+----------+-------------------+   Left Technical Findings: Not visualized segments include Limited visualization of PTV and peroneal veins.   Summary: RIGHT: - No evidence of common femoral vein obstruction.  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  *See table(s) above for measurements and observations. Electronically signed by Deitra Mayo MD on 10/13/2021 at 5:15:36 AM.    Final    CT Angio Abd/Pel w/ and/or w/o  Result Date: 10/13/2021 CLINICAL DATA:  Vaginal bleeding, history of uterine fibroids. EXAM: CTA ABDOMEN AND PELVIS WITHOUT AND WITH CONTRAST TECHNIQUE: Multidetector CT imaging of the abdomen and pelvis was performed using the standard protocol during bolus administration of intravenous contrast. Multiplanar reconstructed images and MIPs were obtained and reviewed to evaluate the vascular anatomy. CONTRAST:  179mL OMNIPAQUE IOHEXOL 350 MG/ML SOLN COMPARISON:  CT abdomen/pelvis 01/11/2021 FINDINGS: VASCULAR Aorta: Mild atherosclerotic calcifications along the abdominal aorta. No evidence of aneurysm or dissection. Celiac: Patent without evidence of aneurysm,  dissection, vasculitis or significant stenosis. SMA: Patent without evidence of aneurysm, dissection, vasculitis or significant stenosis. Renals: Both renal arteries are patent without evidence of aneurysm, dissection, vasculitis, fibromuscular dysplasia or significant stenosis. IMA: Patent without evidence of aneurysm, dissection, vasculitis or significant stenosis. Inflow: Patent without evidence of aneurysm, dissection, vasculitis or significant stenosis. Proximal Outflow: Bilateral common femoral and visualized portions of the superficial and profunda femoral arteries are patent without evidence of aneurysm, dissection, vasculitis or significant stenosis. Other: Hypertrophic uterine arteries bilaterally supplying the massively enlarged uterus. The origins of both uterine arteries are easily identified arising from the anterior divisions of the internal iliac arteries bilaterally. Additionally, both ovarian arteries are slightly hyperplastic and likely providing supply to the fibroid uterus. The origins of the ovarian arteries can be identified on the coronal reformatted images. Veins: No obvious venous abnormality within the limitations of this arterial phase study. Review of the MIP images confirms the above findings. NON-VASCULAR Lower chest: Cardiomegaly.  Small right pleural effusion. Hepatobiliary: No focal liver abnormality is seen. No gallstones, gallbladder wall thickening, or biliary dilatation. Pancreas: Unremarkable. No pancreatic ductal dilatation or surrounding inflammatory changes. Spleen: Normal in size without focal abnormality. Adrenals/Urinary Tract: Adrenal glands are unremarkable. Kidneys are normal, without renal calculi, focal lesion, or hydronephrosis. Bladder is unremarkable. Mild lobularity of the kidneys without focal defect or scarring. Stomach/Bowel: Stomach is within normal limits. Appendix appears normal. No evidence of bowel wall thickening, distention, or inflammatory changes.  Lymphatic: No suspicious lymphadenopathy. Reproductive: Massively enlarged multi fibroid uterus measuring approximately 19.6 x 16.4 x 22 cm (volume = 3700 cm^3). No evidence of active arterial bleeding at this time. Many of the fibroids are heterogeneous and some demonstrate areas of hypo vascularity consistent with regions of prior degeneration. Dystrophic calcifications are present bilaterally. At least 1 pedunculated subserosal fibroid arising from the left aspect of the uterus. No definitive ovarian mass. Other: Trace pelvic ascites is likely within normal limits for reproductive age female. Musculoskeletal: No acute or significant osseous findings. IMPRESSION: 1. Massive multi fibroid uterus with a prominent pedunculated subserosal fibroid arising from the left aspect of the  uterine body. Approximate size of the uterus is 3.7 kg. 2. No evidence of active arterial bleeding at this time. 3. Urine arterial supply via bilateral hypertrophic uterine arteries arising from their typical anatomical position as well as bilateral hypertrophic ovarian arteries (see coronal reformatted images to identify vessel origins). 4. Mild atherosclerotic calcifications without focal stenosis or occlusion. 5. Cardiomegaly consistent with history of CHF. 6. Small right pleural effusion. Electronically Signed   By: Jacqulynn Cadet M.D.   On: 10/13/2021 07:18    Scheduled Meds:  amiodarone  200 mg Oral Daily   atorvastatin  40 mg Oral Daily   baclofen  20 mg Oral TID   carvedilol  25 mg Oral BID   Chlorhexidine Gluconate Cloth  6 each Topical Daily   cloNIDine  0.1 mg Oral TID   feeding supplement  237 mL Oral TID BM   hydrALAZINE  100 mg Oral TID   isosorbide mononitrate  60 mg Oral Daily   mouth rinse  15 mL Mouth Rinse BID   megestrol  40 mg Oral BID   nystatin  5 mL Oral QID   sacubitril-valsartan  1 tablet Oral BID   Continuous Infusions:  sodium chloride 10 mL/hr at 10/12/21 1506     LOS: 3 days   Marylu Lund, MD [facility] Pager On Amion  If 7PM-7AM, please contact night-coverage 10/13/2021, 3:13 PM

## 2021-10-14 ENCOUNTER — Encounter (HOSPITAL_COMMUNITY): Payer: Self-pay | Admitting: Internal Medicine

## 2021-10-14 DIAGNOSIS — G8194 Hemiplegia, unspecified affecting left nondominant side: Secondary | ICD-10-CM | POA: Diagnosis not present

## 2021-10-14 DIAGNOSIS — I619 Nontraumatic intracerebral hemorrhage, unspecified: Secondary | ICD-10-CM | POA: Diagnosis not present

## 2021-10-14 DIAGNOSIS — R140 Abdominal distension (gaseous): Secondary | ICD-10-CM | POA: Diagnosis not present

## 2021-10-14 LAB — CBC
HCT: 30.5 % — ABNORMAL LOW (ref 36.0–46.0)
Hemoglobin: 9.4 g/dL — ABNORMAL LOW (ref 12.0–15.0)
MCH: 26.6 pg (ref 26.0–34.0)
MCHC: 30.8 g/dL (ref 30.0–36.0)
MCV: 86.4 fL (ref 80.0–100.0)
Platelets: 287 10*3/uL (ref 150–400)
RBC: 3.53 MIL/uL — ABNORMAL LOW (ref 3.87–5.11)
RDW: 19.4 % — ABNORMAL HIGH (ref 11.5–15.5)
WBC: 6.4 10*3/uL (ref 4.0–10.5)
nRBC: 0 % (ref 0.0–0.2)

## 2021-10-14 LAB — COMPREHENSIVE METABOLIC PANEL
ALT: 11 U/L (ref 0–44)
AST: 17 U/L (ref 15–41)
Albumin: 3 g/dL — ABNORMAL LOW (ref 3.5–5.0)
Alkaline Phosphatase: 108 U/L (ref 38–126)
Anion gap: 7 (ref 5–15)
BUN: 15 mg/dL (ref 6–20)
CO2: 20 mmol/L — ABNORMAL LOW (ref 22–32)
Calcium: 9.8 mg/dL (ref 8.9–10.3)
Chloride: 110 mmol/L (ref 98–111)
Creatinine, Ser: 0.54 mg/dL (ref 0.44–1.00)
GFR, Estimated: >60 mL/min (ref >60)
Glucose, Bld: 84 mg/dL (ref 70–99)
Potassium: 4.1 mmol/L (ref 3.5–5.1)
Sodium: 137 mmol/L (ref 135–145)
Total Bilirubin: 0.8 mg/dL (ref 0.3–1.2)
Total Protein: 6.6 g/dL (ref 6.5–8.1)

## 2021-10-14 MED ORDER — ALPRAZOLAM 0.25 MG PO TABS
0.2500 mg | ORAL_TABLET | Freq: Once | ORAL | Status: AC
  Administered 2021-10-14: 0.25 mg via ORAL
  Filled 2021-10-14: qty 1

## 2021-10-14 NOTE — Progress Notes (Addendum)
Subjective:  No acute overnight events. Patient was seen at bedside during rounds today.   Pt states that she slept well throughout the night. Denies any new episodes of bleeding, although blood seen in PureWick. She states she is hungry, will provide assistance for ordering food.   No other complaints or concerns at this time.   Pt is updated on the plan for today, and all questions are addressed.   Objective:  Vital signs in last 24 hours: Vitals:   10/14/21 1200 10/14/21 1300 10/14/21 1330 10/14/21 1419  BP: (!) 146/89   (!) 108/59  Pulse: 69 79  75  Resp: 15 18    Temp:   98.8 F (37.1 C) 98.6 F (37 C)  TempSrc:   Oral Oral  SpO2: 100% 100%  100%  Weight:      Height:       Constitutional: alert, well-appearing, in NAD HENT: normocephalic, atraumatic, mucous membranes moist Eyes: conjunctiva non-erythematous, EOMI Cardiovascular: RRR, no m/r/g, non-edematous bilateral LE Pulmonary/Chest: normal work of breathing on room air, LCTAB Abdominal: soft, non TTP, mildly distended abdomen  MSK: normal bulk and tone Neurological: A&O x 3, left-sided hemiplegia with left arm contracted Skin: warm and dry Psych: anxious, normal affect  Assessment/Plan:  Principal Problem:   Acute blood loss anemia Active Problems:   Menorrhagia   Uncontrolled hypertension   Hemorrhagic stroke (HCC)   Uterine fibroid   Left hemiparesis (HCC)   Lupus anticoagulant positive   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)   Pressure injury of skin  45 y.o. 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   Acute blood loss  anemia 2/2 continuous menorragia from uterine fibroids, resolved Poor surgical candidate for hysterectomy given comorbidities; IR consulted, possible fibroid embolization this week. No signs or symptoms of ongoing profuse blood loss, but some blood noted in PureWick. Hg stable ~9.4 over past several days, but refused labs this AM; will try for a CBC later today.  - IR consulted, appreciate recommendations and potential intervention - Megace 40 bid  - HOLD Eliquis  - CTM CBC and transfuse if Hg < 7   Prior Hx of CVA with residual left sided hemiplegia in s/o lupus anticoagulant Was on ASA And Eliquis, as found to be hypercoagulable (lupus anticoagulant) with Neurology starting Eliquis prior to onset of menorrhagia. - HOLD Eliquis  - Neuro consulted to assess risk/benefits to anticoagulation, Dr. Cheral Marker deferred to Stroke Team (Dr Leonie Man) to further stratify risk-benefits. - Hematology consulted at [facility], repeat hypercoag panel pending - Atorvastatin 40 qd - Palliative consulted at [facility] for [place]  HTN SBP stable 140-150's.  - titrate BP meds as tolerated with goal of normotension. Avoid overcorrection of BP given stroke hx.  - Entresto bid  - Clonidine 0.1 tid  - Hydralazine 100 tid  - Imdur 60 qd - Coreg 25 bid  LLE DVT Large DVT in LLE on doppler, discussed with radiology and likely 2/2 compressive mass effect of large uterus.  - IR following   Hx of CHF Tolerated 5 U pRBC's transfusion without signs of fluid overload and euvolemic on exam.  Hx of SVT on amiodorone  Hemodynamically stable - Continue amiodarone    Best Practice: Diet: Dys 3 diet  IVF: None,None VTE: none  Code: Partial code   Lajean Manes, MD 10/14/2021, 5:02 PM Pager: [phone] After 5pm on weekdays and 1pm on weekends: On Call pager [phone]

## 2021-10-14 NOTE — Progress Notes (Signed)
Neuro PA in room, obtaining Cardiac monitoring.

## 2021-10-14 NOTE — Progress Notes (Signed)
Called pts brother, [name], at pts request and informed him that pt is safe and comfortable, has been transferred over to the new bed ( air mattress) and that I would be giving her some tylenol in a little bit for any discomfort she may be having.  Her brother was just here and he thanked me for the call.

## 2021-10-14 NOTE — Consult Note (Signed)
NEURO HOSPITALIST CONSULT NOTE   Requestig physician: Dr. Posey Pronto  Reason for Consult: Guidance on anticoagulation for secondary stroke prevention in the context of recent admission for severe anemia secondary to continuous menorrhagia and history of atrial fibrillation and prior stroke with left hemiplegia.   History obtained from:  Patient, Patient's brother at bedside, and Chart    HPI:                                                                                                                                          Holly Bradley is an 45 y.o. 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  Per patient's brother at bedside, patient has had vaginal bleeding for 1.5 weeks that has been persistent and patient has subsequently become weak secondary to anemia after being started on Eliquis 10/17 due to persistent abnormal lupus anticoagulant with associated increased risk for blood clots.  Labs were drawn outpatient with evidence of low hemoglobin prompting patient to be evaluated in the ED.  In the ED she was evaluated with hemoglobin of 2.9 with a temperature of 102 F and an INR of 1.7 s/p blood transfusion, empiric antibiotic initiation for possible sepsis, and TXA administration. She was transferred to [facility] 10/23 for possible IR fibroid embolization and further evaluation of stroke prophylaxis therapy.   Since her stroke in May  2022, patient has resided in an assisted living facility and is wheelchair-bound.  She needs assistance for all of her activities of daily living and has developed a stage II pressure ulcer on her sacrum as a result of decreased mobility and being unable to position herself.  Past Medical History:  Diagnosis Date   CHF (congestive heart failure) (HCC)    Chronic blood loss anemia    Related to uterine fibroids   Essential hypertension    Poorly controlled, repeat by report only on nicardipine 90 mg   Fibroid    Likely complicated by by menometrorrhagia and chronic anemia   Hypertension    Paroxysmal SVT (supraventricular tachycardia) (HCC)    Frequent episodes, can last anywhere from 20 minutes to 12-15 hours.  Not on beta-blocker or calcium channel blocker   Uterine fibroid    Past Surgical History:  Procedure Laterality Date   IR ANGIOGRAM FOLLOW UP STUDY  05/15/2021   IR CT HEAD  LTD  05/15/2021   IR PERCUTANEOUS ART THROMBECTOMY/INFUSION INTRACRANIAL INC DIAG ANGIO  05/15/2021       IR PERCUTANEOUS ART THROMBECTOMY/INFUSION INTRACRANIAL INC DIAG ANGIO  05/15/2021   IR TRANSCATH/EMBOLIZ  05/15/2021   RADIOLOGY WITH ANESTHESIA N/A 05/15/2021   Procedure: IR WITH ANESTHESIA;  Surgeon: Radiologist, Medication, MD;  Location: [facility];  Service: Radiology;  Laterality: N/A;   Unknown     Family History  Problem Relation Age of Onset   Dementia Mother    Social History:  reports that she has been smoking. She has never used smokeless tobacco. She reports that she does not currently use alcohol. She reports that she does not currently use drugs after having used the following drugs: Marijuana.  No Known Allergies  MEDICATIONS:                                                                                                                     Prior to Admission:  Medications Prior to Admission  Medication Sig Dispense Refill Last Dose   acetaminophen (TYLENOL) 325 MG tablet Place 2 tablets  (650 mg total) into feeding tube every 4 (four) hours as needed for mild pain (or temp > 37.5 C (99.5 F)). (Patient taking differently: Take 650 mg by mouth every 4 (four) hours as needed for mild pain (or temp > 37.5 C (99.5 F)).)   unknown   Amino Acids-Protein Hydrolys (FEEDING SUPPLEMENT, PRO-STAT SUGAR FREE 64,) LIQD Take 30 mLs by mouth 2 (two) times daily.   unknown   amiodarone (PACERONE) 200 MG tablet Take 200 mg by mouth daily.   unknown   apixaban (ELIQUIS) 5 MG TABS tablet Take 5 mg by mouth 2 (two) times daily.   10/09/2021 at AM   aspirin 81 MG chewable tablet Chew 1 tablet (81 mg total) by mouth daily.   uknown   atorvastatin (LIPITOR) 40 MG tablet Take 1 tablet (40 mg total) by mouth daily.   unknown   baclofen (LIORESAL) 20 MG tablet Take 20 mg by mouth 4 (four) times daily.   uknown   carvedilol (COREG) 25 MG tablet Take 1 tablet (25 mg total) by mouth 2 (two) times daily with a meal.   unknown at unknown   cloNIDine (CATAPRES) 0.1 MG tablet Take 1 tablet (0.1 mg total) by mouth 3 (three) times daily. 60 tablet 11 unknown   ENTRESTO 97-103 MG Take 1 tablet by mouth 2 (two) times daily.   unknown   escitalopram (LEXAPRO) 10 MG tablet Take 15 mg by mouth daily.   unknown   feeding supplement (ENSURE ENLIVE / ENSURE PLUS) LIQD Take 237 mLs by mouth 3 (three) times daily between meals. 237 mL 12 unknown   ferrous sulfate 325 (65 FE) MG tablet Take 1 tablet (325 mg total) by mouth 2 (two) times daily with a meal. 60 tablet 3 unknown   gabapentin (NEURONTIN) 300 MG capsule Take 300 mg by mouth 3 (three) times daily.   unknown  hydrALAZINE (APRESOLINE) 100 MG tablet Take 1 tablet (100 mg total) by mouth 3 (three) times daily. 90 tablet 3 unknown   isosorbide mononitrate (IMDUR) 60 MG 24 hr tablet Take 60 mg by mouth daily.   unknown   lactulose (CHRONULAC) 10 GM/15ML solution Take 15 mLs (10 g total) by mouth 2 (two) times daily. 236 mL 0 unknown   linaclotide (LINZESS) 145 MCG CAPS  capsule Take 145 mcg by mouth daily before breakfast.   unknown   Multiple Vitamin (MULTIVITAMIN WITH MINERALS) TABS tablet Take 1 tablet by mouth daily.      pantoprazole (PROTONIX) 40 MG tablet Take 40 mg by mouth daily.      sacubitril-valsartan (ENTRESTO) 97-103 MG Take 1 tablet by mouth 2 (two) times daily. 60 tablet 3 unknown   simethicone (MYLICON) 40 QZ/0.0PQ drops Take 0.6 mLs (40 mg total) by mouth every 6 (six) hours as needed for flatulence. 30 mL 0    ARIPiprazole (ABILIFY) 2 MG tablet Take 2 mg by mouth daily. (Patient not taking: Reported on 10/10/2021)   Not Taking   gabapentin (NEURONTIN) 100 MG capsule Take 100 mg by mouth 3 (three) times daily. (Patient not taking: Reported on 10/10/2021)   Not Taking   megestrol (MEGACE) 40 MG tablet Take 40 mg by mouth 2 (two) times daily. (Patient not taking: Reported on 10/10/2021)   Not Taking   oxyCODONE HCl 7.5 MG TABS Take 7.5 mg by mouth every 4 (four) hours as needed. (Patient not taking: Reported on 10/10/2021) 30 tablet 0 Not Taking   torsemide (DEMADEX) 20 MG tablet TAKE 2 TABLETS (40 MG TOTAL) BY MOUTH 2 (TWO) TIMES DAILY. (Patient not taking: Reported on 10/10/2021) 120 tablet 2 Not Taking   traMADol (ULTRAM) 50 MG tablet Take by mouth every 6 (six) hours as needed. (Patient not taking: Reported on 10/10/2021)   Not Taking   ROS:                                                                                                                                       History obtained from the patient  General ROS: negative for - chills, night sweats, weight gain or weight loss Psychological ROS: negative for - behavioral disorder, hallucinations, memory difficulties Ophthalmic ROS: negative for - blurry vision, double vision, eye pain or loss of vision ENT ROS: negative for - epistaxis, nasal discharge, oral lesions, sore throat Allergy and Immunology ROS: negative for - hives or itchy/watery eyes Hematological and Lymphatic ROS:  negative for - bruising or swollen lymph nodes Endocrine ROS: negative for - polydipsia/polyuria or temperature intolerance Respiratory ROS: negative for - cough, hemoptysis, shortness of breath or wheezing Cardiovascular ROS: negative for - chest pain, irregular heartbeat Gastrointestinal ROS: negative for -  diarrhea, hematemesis Genito-Urinary ROS: negative for - dysuria, hematuria, or urinary frequency/urgency Musculoskeletal ROS: negative for - joint swelling or muscular weakness Neurological ROS:  as noted in HPI Dermatological ROS: negative for rash and skin lesion changes  Blood pressure (!) 108/59, pulse 75, temperature 98.6 F (37 C), temperature source Oral, resp. rate 18, height 5\' 5"  (1.651 m), weight 57.5 kg, SpO2 100 %.  General Examination:                                                                                                       Physical Exam  General: Frail appearing female laying comfortably in hospital bed, in no acute distress HEENT-  Normocephalic, no lesions, without obvious abnormality.  Normal external eye and conjunctiva.   Cardiovascular- regular rate and rhythm Lungs-no rhonchi or wheezing noted, no excessive working breathing.  Abdomen- Abdomen is distended and rigid with some tenderness to palpation Extremities- Warm, dry and intact Musculoskeletal-no joint tenderness, deformity or swelling Skin-warm and dry, no hyperpigmentation, vitiligo, or suspicious lesions  Neurological Examination Mental Status: Alert, oriented to self and location, states the year correctly but states that the current month is December (while watching Christmas movies on television), thought content appropriate. Some decreased attention noted as she defers to her brother at bedside to answer examiner questions. She is able to follow simple commands.  There is slowed speech with dysarthria.  Cranial Nerves: II:  There is left homonymous hemianopsia to confrontation   III,IV, VI: Ptosis not present, extra-ocular motions intact bilaterally pupils equal, round, and reactive to light V,VII: Left mouth droop present VIII: Hearing normal bilaterally IX,X: Palate rises symmetrically XI: Shoulder shrug intact on the right XII: Tongue is midline Motor: Right : Upper extremity   5/5    Left:     Upper extremity   0/5  Lower extremity   3/5     Lower extremity   0/5 Tone is increased on the left. Tone and bulk are normal in the right upper extremity.  Sensory: intact and symmetric to light touch throughout Deep Tendon Reflexes: 1+ and symmetric throughout Plantars: Right: downgoing   Left: downgoing Cerebellar: There is no dysmetria in the right upper extremity, unable to test other extremities Gait: deferred, patient is wheelchair bound at baseline   Lab Results: Basic Metabolic Panel: Recent Labs  Lab 10/09/21 2241 10/11/21 0229 10/12/21 0247 10/13/21 0715 10/14/21 0853  NA 140 138 137 137 137  K 3.9 3.8 4.3 4.0 4.1  CL 111 110 109 112* 110  CO2 20* 22 21* 17* 20*  GLUCOSE 98 99 95 81 84  BUN 20 17 19 18 15   CREATININE 1.14* 0.84 0.77 0.60 0.54  CALCIUM 9.3 9.1 9.4 9.8 9.8   CBC: Recent Labs  Lab 10/09/21 2241 10/09/21 2258 10/10/21 0003 10/10/21 1244 10/11/21 0229 10/11/21 1349 10/12/21 0247 10/13/21 0715 10/14/21 0853  WBC 9.1 9.2  --  9.0 6.0  --  8.2 7.4 6.4  NEUTROABS 7.4 7.7  --   --   --   --   --   --   --   HGB 3.0* 2.9*   < > 11.1* 8.1* 9.1* 9.9* 9.3* 9.4*  HCT 12.0* 11.1*   < >  35.1* 26.5* 28.7* 32.8* 29.9* 30.5*  MCV 84.5 80.4  --  87.3 86.9  --  87.5 86.2 86.4  PLT 336 336   < > 332 247  --  247 287 287   < > = values in this interval not displayed.   Cardiac Enzymes: No results for input(s): CKTOTAL, CKMB, CKMBINDEX, TROPONINI in the last 168 hours.  Lipid Panel: No results for input(s): CHOL, TRIG, HDL, CHOLHDL, VLDL, LDLCALC in the last 168 hours.  Imaging: CT Angio Abd/Pel w/ and/or w/o  Result Date:  10/13/2021 CLINICAL DATA:  Vaginal bleeding, history of uterine fibroids. EXAM: CTA ABDOMEN AND PELVIS WITHOUT AND WITH CONTRAST TECHNIQUE: Multidetector CT imaging of the abdomen and pelvis was performed using the standard protocol during bolus administration of intravenous contrast. Multiplanar reconstructed images and MIPs were obtained and reviewed to evaluate the vascular anatomy. CONTRAST:  163mL OMNIPAQUE IOHEXOL 350 MG/ML SOLN COMPARISON:  CT abdomen/pelvis 01/11/2021 FINDINGS: VASCULAR Aorta: Mild atherosclerotic calcifications along the abdominal aorta. No evidence of aneurysm or dissection. Celiac: Patent without evidence of aneurysm, dissection, vasculitis or significant stenosis. SMA: Patent without evidence of aneurysm, dissection, vasculitis or significant stenosis. Renals: Both renal arteries are patent without evidence of aneurysm, dissection, vasculitis, fibromuscular dysplasia or significant stenosis. IMA: Patent without evidence of aneurysm, dissection, vasculitis or significant stenosis. Inflow: Patent without evidence of aneurysm, dissection, vasculitis or significant stenosis. Proximal Outflow: Bilateral common femoral and visualized portions of the superficial and profunda femoral arteries are patent without evidence of aneurysm, dissection, vasculitis or significant stenosis. Other: Hypertrophic uterine arteries bilaterally supplying the massively enlarged uterus. The origins of both uterine arteries are easily identified arising from the anterior divisions of the internal iliac arteries bilaterally. Additionally, both ovarian arteries are slightly hyperplastic and likely providing supply to the fibroid uterus. The origins of the ovarian arteries can be identified on the coronal reformatted images. Veins: No obvious venous abnormality within the limitations of this arterial phase study. Review of the MIP images confirms the above findings. NON-VASCULAR Lower chest: Cardiomegaly.  Small  right pleural effusion. Hepatobiliary: No focal liver abnormality is seen. No gallstones, gallbladder wall thickening, or biliary dilatation. Pancreas: Unremarkable. No pancreatic ductal dilatation or surrounding inflammatory changes. Spleen: Normal in size without focal abnormality. Adrenals/Urinary Tract: Adrenal glands are unremarkable. Kidneys are normal, without renal calculi, focal lesion, or hydronephrosis. Bladder is unremarkable. Mild lobularity of the kidneys without focal defect or scarring. Stomach/Bowel: Stomach is within normal limits. Appendix appears normal. No evidence of bowel wall thickening, distention, or inflammatory changes. Lymphatic: No suspicious lymphadenopathy. Reproductive: Massively enlarged multi fibroid uterus measuring approximately 19.6 x 16.4 x 22 cm (volume = 3700 cm^3). No evidence of active arterial bleeding at this time. Many of the fibroids are heterogeneous and some demonstrate areas of hypo vascularity consistent with regions of prior degeneration. Dystrophic calcifications are present bilaterally. At least 1 pedunculated subserosal fibroid arising from the left aspect of the uterus. No definitive ovarian mass. Other: Trace pelvic ascites is likely within normal limits for reproductive age female. Musculoskeletal: No acute or significant osseous findings. IMPRESSION: 1. Massive multi fibroid uterus with a prominent pedunculated subserosal fibroid arising from the left aspect of the uterine body. Approximate size of the uterus is 3.7 kg. 2. No evidence of active arterial bleeding at this time. 3. Urine arterial supply via bilateral hypertrophic uterine arteries arising from their typical anatomical position as well as bilateral hypertrophic ovarian arteries (see coronal reformatted images to identify vessel origins). 4. Mild  atherosclerotic calcifications without focal stenosis or occlusion. 5. Cardiomegaly consistent with history of CHF. 6. Small right pleural effusion.  Electronically Signed   By: Jacqulynn Cadet M.D.   On: 10/13/2021 07:18    Vascular US IVC/Iliac bilateral 10/21: Summary:  IVC/Iliac: There is evidence of age indeterminate thrombus involving the left common iliac vein. There is evidence of age indeterminate thrombus involving the left external iliac vein. Distal IVC is patent, although appears potentially compressed.  Visualization of proximal Inferior Vena Cava and mid inferior vena cava was limited. Unable to visualize the right common iliac and external iliac veins due to uterine mass.   Vascular US lower extremity venous left 10/21: RIGHT:  - No evidence of common femoral vein obstruction.     LEFT:  - Findings consistent with age indeterminate deep vein thrombosis involving the left common femoral vein, SF junction, left femoral vein, and external iliac vein, common iliac vein.   Assessment: 45 year old female with anemia secondary to menorrhagia in the context of inoperable uterine fibroids. Neurology was consulted for guidance on anticoagulation for secondary stroke prevention in the context of her atrial fibrillation and prior stroke with left hemiplegia.  1. Exam reveals patient with chronic left hemiparesis from previous stroke with dysarthria, and left homonymous hemianopsia to confrontation. She also has a distended abdomen with tenderness to palpation, tenderness to her sacrum due to pressure injury, and dense tenderness with manipulation of left upper or lower extremities.   2. Per hematology note 10/21, it is unclear if the lupus anticoagulant was truly positive as the second test was drawn while the patient was on Eliquis.  Hematology is repeating the lupus anticoagulant.  Plans for IR embolization of uterine fibroid.   Recommendations: 1. Since only a persistent LA meets laboratory diagnostic criteria for antiphospholipid syndrome and the patient was on apixaban which can cause false positive LA results her overall risk  profile is not clear. She is in the wake of life-threatening acute blood loss requiring multiple interventions/transfusions now with a stable hemoglobin and the risk of catastrophic re-bleed is high. She will be transferred to the stroke service so Dr. Leonie Man can further stratify risk-benefits of anticoagulation.    Electronically signed: Dr. Kerney Elbe 10/14/2021, 2:56 PM

## 2021-10-14 NOTE — H&P (Signed)
Chief Complaint: Menorrhagia with anemia secondary to fibroid uterus  Referring Physician(s): Donne Hazel, MD  Supervising Physician: Ruthann Cancer  Patient Status: [facility] - In-pt  History of Present Illness: Holly Bradley is a 45 y.o. 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    She was brought to the ED from a skilled nursing facility due to continuous menorrhagia.    Her hemoglobin was 2.9.    Pelvic ultrasound showed an enlarged uterus with multiple uterine fibroids measuring up to 6.5 cm.  She was transfused with 4 units PRBCs and 1 unit of FFP .   OB/GYN feels she is not a surgical candidate for hysterectomy.  We are asked to evaluate her for possible uterine artery embolization for control of bleeding.    CT scan showed= 1. Massive multi fibroid uterus with a prominent pedunculated subserosal fibroid arising from the left aspect of the uterine body. Approximate size of the uterus is 3.7 kg. 2. No evidence of active arterial bleeding at this time. 3. Urine arterial supply via bilateral hypertrophic uterine arteries arising from their typical anatomical position as well as bilateral hypertrophic ovarian arteries.      Past Medical History:  Diagnosis Date   CHF (congestive heart failure) (HCC)    Chronic blood loss anemia    Related to uterine fibroids   Essential hypertension    Poorly controlled, repeat by report only on nicardipine 90 mg   Fibroid    Likely complicated by by menometrorrhagia and chronic anemia   Hypertension    Paroxysmal SVT (supraventricular tachycardia) (HCC)    Frequent episodes, can last anywhere from 20 minutes to 12-15 hours.  Not on beta-blocker or calcium channel blocker   Uterine fibroid     Past Surgical History:  Procedure Laterality Date   IR ANGIOGRAM FOLLOW UP  STUDY  05/15/2021   IR CT HEAD LTD  05/15/2021   IR PERCUTANEOUS ART THROMBECTOMY/INFUSION INTRACRANIAL INC DIAG ANGIO  05/15/2021       IR PERCUTANEOUS ART THROMBECTOMY/INFUSION INTRACRANIAL INC DIAG ANGIO  05/15/2021   IR TRANSCATH/EMBOLIZ  05/15/2021   RADIOLOGY WITH ANESTHESIA N/A 05/15/2021   Procedure: IR WITH ANESTHESIA;  Surgeon: Radiologist, Medication, MD;  Location: [place];  Service: Radiology;  Laterality: N/A;   Unknown      Allergies: Patient has no known allergies.  Medications: Prior to Admission medications   Medication Sig Start Date End Date Taking? Authorizing Provider  acetaminophen (TYLENOL) 325 MG tablet Place 2 tablets (650 mg total) into feeding tube every 4 (four) hours as needed for mild pain (or temp > 37.5 C (99.5 F)). Patient taking differently: Take 650 mg by mouth every 4 (four) hours as needed for mild pain (or temp > 37.5 C (99.5 F)). 07/03/21  Yes Samella Parr, NP  Amino Acids-Protein Hydrolys (FEEDING SUPPLEMENT, PRO-STAT SUGAR FREE 64,) LIQD Take 30 mLs by mouth 2 (two) times daily.   Yes [provider]  amiodarone (PACERONE) 200 MG tablet Take 200 mg by mouth daily. 02/02/21  Yes [provider]  apixaban (ELIQUIS) 5 MG TABS tablet Take 5 mg by mouth 2 (two) times daily.   Yes [provider]  aspirin 81 MG chewable tablet Chew 1 tablet (81 mg total) by mouth daily. 07/04/21  Yes Samella Parr, NP  atorvastatin (LIPITOR) 40 MG tablet Take 1  tablet (40 mg total) by mouth daily. 07/04/21  Yes Samella Parr, NP  baclofen (LIORESAL) 20 MG tablet Take 20 mg by mouth 4 (four) times daily.   Yes [provider]  carvedilol (COREG) 25 MG tablet Take 1 tablet (25 mg total) by mouth 2 (two) times daily with a meal. 07/03/21  Yes Samella Parr, NP  cloNIDine (CATAPRES) 0.1 MG tablet Take 1 tablet (0.1 mg total) by mouth 3 (three) times daily. 07/03/21  Yes Samella Parr, NP  ENTRESTO 97-103 MG Take 1 tablet by mouth 2 (two)  times daily. 02/20/21  Yes [provider]  escitalopram (LEXAPRO) 10 MG tablet Take 15 mg by mouth daily.   Yes [provider]  feeding supplement (ENSURE ENLIVE / ENSURE PLUS) LIQD Take 237 mLs by mouth 3 (three) times daily between meals. 07/03/21  Yes Samella Parr, NP  ferrous sulfate 325 (65 FE) MG tablet Take 1 tablet (325 mg total) by mouth 2 (two) times daily with a meal. 02/20/21  Yes Lyda Jester M, PA-C  gabapentin (NEURONTIN) 300 MG capsule Take 300 mg by mouth 3 (three) times daily.   Yes [provider]  hydrALAZINE (APRESOLINE) 100 MG tablet Take 1 tablet (100 mg total) by mouth 3 (three) times daily. 03/02/21  Yes Bensimhon, Shaune Pascal, MD  isosorbide mononitrate (IMDUR) 60 MG 24 hr tablet Take 60 mg by mouth daily. 02/02/21  Yes [provider]  lactulose (CHRONULAC) 10 GM/15ML solution Take 15 mLs (10 g total) by mouth 2 (two) times daily. 07/03/21  Yes Samella Parr, NP  linaclotide (LINZESS) 145 MCG CAPS capsule Take 145 mcg by mouth daily before breakfast.   Yes [provider]  Multiple Vitamin (MULTIVITAMIN WITH MINERALS) TABS tablet Take 1 tablet by mouth daily. 07/04/21  Yes Samella Parr, NP  pantoprazole (PROTONIX) 40 MG tablet Take 40 mg by mouth daily.   Yes [provider]  sacubitril-valsartan (ENTRESTO) 97-103 MG Take 1 tablet by mouth 2 (two) times daily. 02/20/21  Yes Rosita Fire, Brittainy M, PA-C  simethicone (MYLICON) 40 YC/1.4GY drops Take 0.6 mLs (40 mg total) by mouth every 6 (six) hours as needed for flatulence. 07/03/21  Yes Samella Parr, NP  ARIPiprazole (ABILIFY) 2 MG tablet Take 2 mg by mouth daily. Patient not taking: Reported on 10/10/2021    [provider]  gabapentin (NEURONTIN) 100 MG capsule Take 100 mg by mouth 3 (three) times daily. Patient not taking: Reported on 10/10/2021    [provider]  megestrol (MEGACE) 40 MG tablet Take 40 mg by mouth 2 (two) times  daily. Patient not taking: Reported on 10/10/2021    [provider]  oxyCODONE HCl 7.5 MG TABS Take 7.5 mg by mouth every 4 (four) hours as needed. Patient not taking: Reported on 10/10/2021 07/03/21   Samella Parr, NP  torsemide (DEMADEX) 20 MG tablet TAKE 2 TABLETS (40 MG TOTAL) BY MOUTH 2 (TWO) TIMES DAILY. Patient not taking: Reported on 10/10/2021 03/02/21 03/02/22  Bensimhon, Shaune Pascal, MD  traMADol (ULTRAM) 50 MG tablet Take by mouth every 6 (six) hours as needed. Patient not taking: Reported on 10/10/2021    [provider]     Family History  Problem Relation Age of Onset   Dementia Mother     Social History   Socioeconomic History   Marital status: Single    Spouse name: Not on file   Number of children: 0   Years  of education: Not on file   Highest education level: Not on file  Occupational History   Not on file  Tobacco Use   Smoking status: Every Day   Smokeless tobacco: Never  Vaping Use   Vaping Use: Never used  Substance and Sexual Activity   Alcohol use: Not Currently    Comment: Occasionally   Drug use: Not Currently    Types: Marijuana   Sexual activity: Not on file  Other Topics Concern   Not on file  Social History Narrative   ** Merged History Encounter **       Social Determinants of Health   Financial Resource Strain: High Risk   Difficulty of Paying Living Expenses: Very hard  Food Insecurity: No Food Insecurity   Worried About Charity fundraiser in the Last Year: Never true   Ran Out of Food in the Last Year: Never true  Transportation Needs: No Transportation Needs   Lack of Transportation (Medical): No   Lack of Transportation (Non-Medical): No  Physical Activity: Not on file  Stress: Not on file  Social Connections: Not on file     Review of Systems: A 12 point ROS discussed and pertinent positives are indicated in the HPI above.  All other systems are negative.  Review of Systems  Vital Signs: BP (!)  153/70   Pulse 69   Temp 97.7 F (36.5 C) (Oral)   Resp 18   Ht 5\' 5"  (1.651 m)   Wt 57.5 kg   LMP  (LMP Unknown)   SpO2 100%   BMI 21.09 kg/m   Physical Exam Vitals reviewed.  Constitutional:      Appearance: Normal appearance.  HENT:     Head: Normocephalic and atraumatic.  Eyes:     Extraocular Movements: Extraocular movements intact.  Cardiovascular:     Rate and Rhythm: Normal rate and regular rhythm.  Pulmonary:     Effort: Pulmonary effort is normal. No respiratory distress.     Breath sounds: Normal breath sounds.  Abdominal:     Comments: Palpable  fibroid uterus  Musculoskeletal:     Cervical back: Normal range of motion.     Comments: Left hemiplegia secondary to CVA  Skin:    General: Skin is warm and dry.  Neurological:     General: No focal deficit present.     Mental Status: She is alert and oriented to person, place, and time.  Psychiatric:        Mood and Affect: Mood normal.        Behavior: Behavior normal.        Thought Content: Thought content normal.        Judgment: Judgment normal.    Imaging: DG Abd 1 View  Result Date: 10/10/2021 CLINICAL DATA:  Abdominal distension EXAM: ABDOMEN - 1 VIEW COMPARISON:  None. FINDINGS: There is no evidence of bowel obstruction. Mass effect from known pelvic mass. There are no radiopaque calculi overlying the kidneys or course of the ureters. No acute osseous abnormality. IMPRESSION: No evidence of bowel obstruction. Electronically Signed   By: Maurine Simmering M.D.   On: 10/10/2021 14:31   US PELVIS (TRANSABDOMINAL ONLY)  Result Date: 10/10/2021 CLINICAL DATA:  Pelvic pain, bleeding, uterine fibroids EXAM: TRANSABDOMINAL ULTRASOUND OF PELVIS TECHNIQUE: Transabdominal ultrasound examination of the pelvis was performed including evaluation of the uterus, ovaries, adnexal regions, and pelvic cul-de-sac. COMPARISON:  CT abdomen/pelvis dated 01/11/2021 FINDINGS: Uterus Measurements: 24.7 x 13.7 x 22.5 cm =  volume:  3982 mL. Multiple uterine fibroids, including: --6.5 x 4.9 x 5.2 cm intramural fibroid in the right posterior uterine body --6.2 x 6.3 x 5.1 cm intramural fibroid in the right posterior uterine fundus --4.1 x 3.1 x 4.4 cm subserosal fibroid in the left anterior uterine body Endometrium Thickness: 27 mm. Heterogeneous thickening with complex soft tissue/fluid in the endometrial cavity. Right ovary Not discretely visualized.  No adnexal mass is seen. Left ovary Not discretely visualized.  No adnexal mass is seen. Other findings:  No abnormal free fluid. IMPRESSION: Enlarged uterus with multiple uterine fibroids, measuring up to 6.5 cm, as above. Heterogeneous, thickened endometrium, measuring 2.7 cm. Endometrial sampling is suggested. Electronically Signed   By: Julian Hy M.D.   On: 10/10/2021 02:54   VAS [place] IVC/ILIAC (VENOUS ONLY)  Result Date: 10/13/2021 a  IVC/ILIAC STUDY Patient Name:  Holly Bradley  Date of Exam:   10/12/2021 Medical Rec #: [id number]         Accession #:    [id number] Date of Birth: March 01, 1976         Patient Gender: F Patient Age:   64 years Exam Location:  [facility] Procedure:      VAS [place] IVC/ILIAC (VENOUS ONLY) Referring Phys: --------------------------------------------------------------------------------  Limitations: Known massive uterine mass displacing anatomical structures and limiting visualization.  Comparison Study: 05/02/2021- negative lower extremity venous duplex Performing Technologist: Maudry Mayhew MHA, RDMS, RVT, RDCS  Examination Guidelines: A complete evaluation includes B-mode imaging, spectral Doppler, color Doppler, and power Doppler as needed of all accessible portions of each vessel. Bilateral testing is considered an integral part of a complete examination. Limited examinations for reoccurring indications may be performed as noted.  IVC/Iliac Findings: +----------+------+--------+------------------------------+    IVC    PatentThrombus            Comments            +----------+------+--------+------------------------------+ IVC Prox                Unable to adequately visualize +----------+------+--------+------------------------------+ IVC Mid                 Unable to adequately visualize +----------+------+--------+------------------------------+ IVC Distalpatent                                       +----------+------+--------+------------------------------+  +----------------+---------+-----------+---------+-----------------+--------+       CIV       RT-PatentRT-ThrombusLT-Patent   LT-Thrombus   Comments +----------------+---------+-----------+---------+-----------------+--------+ Common Iliac Mid                             age indeterminate         +----------------+---------+-----------+---------+-----------------+--------+  +--------------------+---------+-----------+---------+----------------+--------+         EIV         RT-PatentRT-ThrombusLT-Patent  LT-Thrombus   Comments +--------------------+---------+-----------+---------+----------------+--------+ External Iliac Vein                                    age                Mid  indeterminate           +--------------------+---------+-----------+---------+----------------+--------+   Summary: IVC/Iliac: There is evidence of age indeterminate thrombus involving the left common iliac vein. There is evidence of age indeterminate thrombus involving the left external iliac vein. Distal IVC is patent, although appears potentially compressed. Visualization of proximal Inferior Vena Cava and mid inferior vena cava was limited. Unable to visualize the right common iliac and external iliac veins due to uterine mass.  *See table(s) above for measurements and observations.  Electronically signed by Deitra Mayo MD on 10/13/2021 at 5:15:17 AM.    Final    DG Chest Port 1 View  Result Date:  10/09/2021 CLINICAL DATA:  Anemia EXAM: PORTABLE CHEST 1 VIEW COMPARISON:  06/02/2021 FINDINGS: Lungs are clear.  No pleural effusion or pneumothorax. Skin fold along the right lateral hemithorax. Cardiomegaly. IMPRESSION: Cardiomegaly. No evidence of acute cardiopulmonary disease. Electronically Signed   By: Julian Hy M.D.   On: 10/09/2021 22:17   VAS [place] LOWER EXTREMITY VENOUS (DVT)  Result Date: 10/13/2021  Lower Venous DVT Study Patient Name:  Holly Bradley  Date of Exam:   10/12/2021 Medical Rec #: [id number]         Accession #:    [id number] Date of Birth: [birth year]         Patient Gender: F Patient Age:   53 years Exam Location:  [facility] Procedure:      VAS [place] LOWER EXTREMITY VENOUS (DVT) Referring Phys: STEPHEN CHIU --------------------------------------------------------------------------------  Indications: Edema.  Comparison Study: 05/02/2020- negative lower extremity venous duplex Performing Technologist: Maudry Mayhew MHA, RDMS, RVT, RDCS  Examination Guidelines: A complete evaluation includes B-mode imaging, spectral Doppler, color Doppler, and power Doppler as needed of all accessible portions of each vessel. Bilateral testing is considered an integral part of a complete examination. Limited examinations for reoccurring indications may be performed as noted. The reflux portion of the exam is performed with the patient in reverse Trendelenburg.  +-----+---------------+---------+-----------+----------+--------------+ RIGHTCompressibilityPhasicitySpontaneityPropertiesThrombus Aging +-----+---------------+---------+-----------+----------+--------------+ CFV  Full           Yes                 patent                   +-----+---------------+---------+-----------+----------+--------------+   +---------+---------------+---------+-----------+----------+-------------------+ LEFT     CompressibilityPhasicitySpontaneityPropertiesThrombus Aging       +---------+---------------+---------+-----------+----------+-------------------+ CFV      None                    No                   Age Indeterminate   +---------+---------------+---------+-----------+----------+-------------------+ SFJ      None                                         Age Indeterminate   +---------+---------------+---------+-----------+----------+-------------------+ FV Prox  Full                               patent                        +---------+---------------+---------+-----------+----------+-------------------+ FV Mid   None  Age Indeterminate   +---------+---------------+---------+-----------+----------+-------------------+ FV DistalFull                               patent                        +---------+---------------+---------+-----------+----------+-------------------+ PFV      Full                               patent                        +---------+---------------+---------+-----------+----------+-------------------+ POP      Full           No       Yes        patent                        +---------+---------------+---------+-----------+----------+-------------------+ PTV      Full                               patent                        +---------+---------------+---------+-----------+----------+-------------------+ PERO     Full                               patent                        +---------+---------------+---------+-----------+----------+-------------------+ EIV                              Yes                  partial age                                                               indeterminate                                                             thrombus            +---------+---------------+---------+-----------+----------+-------------------+ CIV                              No                   Age Indeterminate    +---------+---------------+---------+-----------+----------+-------------------+   Left Technical Findings: Not visualized segments include Limited visualization of PTV and peroneal veins.   Summary: RIGHT: - No evidence of common femoral vein obstruction.  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  *See table(s) above for measurements and observations. Electronically signed by Deitra Mayo MD on 10/13/2021 at 5:15:36 AM.    Final    CT Angio Abd/Pel w/ and/or w/o  Result Date: 10/13/2021 CLINICAL DATA:  Vaginal bleeding, history of uterine fibroids. EXAM: CTA ABDOMEN AND PELVIS WITHOUT AND WITH CONTRAST TECHNIQUE: Multidetector CT imaging of the abdomen and pelvis was performed using the standard protocol during bolus administration of intravenous contrast. Multiplanar reconstructed images and MIPs were obtained and reviewed to evaluate the vascular anatomy. CONTRAST:  142mL OMNIPAQUE IOHEXOL 350 MG/ML SOLN COMPARISON:  CT abdomen/pelvis 01/11/2021 FINDINGS: VASCULAR Aorta: Mild atherosclerotic calcifications along the abdominal aorta. No evidence of aneurysm or dissection. Celiac: Patent without evidence of aneurysm, dissection, vasculitis or significant stenosis. SMA: Patent without evidence of aneurysm, dissection, vasculitis or significant stenosis. Renals: Both renal arteries are patent without evidence of aneurysm, dissection, vasculitis, fibromuscular dysplasia or significant stenosis. IMA: Patent without evidence of aneurysm, dissection, vasculitis or significant stenosis. Inflow: Patent without evidence of aneurysm, dissection, vasculitis or significant stenosis. Proximal Outflow: Bilateral common femoral and visualized portions of the superficial and profunda femoral arteries are patent without evidence of aneurysm, dissection, vasculitis or  significant stenosis. Other: Hypertrophic uterine arteries bilaterally supplying the massively enlarged uterus. The origins of both uterine arteries are easily identified arising from the anterior divisions of the internal iliac arteries bilaterally. Additionally, both ovarian arteries are slightly hyperplastic and likely providing supply to the fibroid uterus. The origins of the ovarian arteries can be identified on the coronal reformatted images. Veins: No obvious venous abnormality within the limitations of this arterial phase study. Review of the MIP images confirms the above findings. NON-VASCULAR Lower chest: Cardiomegaly.  Small right pleural effusion. Hepatobiliary: No focal liver abnormality is seen. No gallstones, gallbladder wall thickening, or biliary dilatation. Pancreas: Unremarkable. No pancreatic ductal dilatation or surrounding inflammatory changes. Spleen: Normal in size without focal abnormality. Adrenals/Urinary Tract: Adrenal glands are unremarkable. Kidneys are normal, without renal calculi, focal lesion, or hydronephrosis. Bladder is unremarkable. Mild lobularity of the kidneys without focal defect or scarring. Stomach/Bowel: Stomach is within normal limits. Appendix appears normal. No evidence of bowel wall thickening, distention, or inflammatory changes. Lymphatic: No suspicious lymphadenopathy. Reproductive: Massively enlarged multi fibroid uterus measuring approximately 19.6 x 16.4 x 22 cm (volume = 3700 cm^3). No evidence of active arterial bleeding at this time. Many of the fibroids are heterogeneous and some demonstrate areas of hypo vascularity consistent with regions of prior degeneration. Dystrophic calcifications are present bilaterally. At least 1 pedunculated subserosal fibroid arising from the left aspect of the uterus. No definitive ovarian mass. Other: Trace pelvic ascites is likely within normal limits for reproductive age female. Musculoskeletal: No acute or significant  osseous findings. IMPRESSION: 1. Massive multi fibroid uterus with a prominent pedunculated subserosal fibroid arising from the left aspect of the uterine body. Approximate size of the uterus is 3.7 kg. 2. No evidence of active arterial bleeding at this time. 3. Urine arterial supply via bilateral hypertrophic uterine arteries arising from their typical anatomical position as well as bilateral hypertrophic ovarian arteries (see coronal reformatted images to identify vessel origins). 4. Mild atherosclerotic calcifications without focal stenosis or occlusion. 5. Cardiomegaly consistent with history of CHF. 6. Small right pleural effusion. Electronically Signed   By: Jacqulynn Cadet M.D.   On: 10/13/2021 07:18    Labs:  CBC: Recent Labs    10/11/21 0229 10/11/21 1349 10/12/21 0247 10/13/21 0715 10/14/21 0853  WBC 6.0  --  8.2 7.4 6.4  HGB 8.1* 9.1* 9.9* 9.3* 9.4*  HCT 26.5* 28.7* 32.8* 29.9* 30.5*  PLT 247  --  247 287 287    COAGS: Recent Labs    01/10/21 2125 10/09/21 2300 10/10/21 0018 10/10/21 1244  INR 1.3* 1.7* 1.7* 1.3*  APTT 25 46* 46* 35    BMP: Recent Labs    10/11/21 0229 10/12/21 0247 10/13/21 0715 10/14/21 0853  NA 138 137 137 137  K 3.8 4.3 4.0 4.1  CL 110 109 112* 110  CO2 22 21* 17* 20*  GLUCOSE 99 95 81 84  BUN 17 19 18 15   CALCIUM 9.1 9.4 9.8 9.8  CREATININE 0.84 0.77 0.60 0.54  GFRNONAA >60 >60 >60 >60    LIVER FUNCTION TESTS: Recent Labs    10/11/21 0229 10/12/21 0247 10/13/21 0715 10/14/21 0853  BILITOT 1.0 0.8 0.7 0.8  AST 11* 25 18 17   ALT 8 11 11 11   ALKPHOS 90 105 97 108  PROT 6.1* 7.0 6.5 6.6  ALBUMIN 2.9* 3.0* 2.9* 3.0*    TUMOR MARKERS: No results for input(s): AFPTM, CEA, CA199, CHROMGRNA in the last 8760 hours.  Assessment and Plan:  Profound anemia secondary to menorrhagia due to large fibroid uterus.  Will plan for uterine artery embolization next week as IR schedule allows.  The Risks and benefits of uterine artery  embolization were discussed with the patient including, but not limited to bleeding, infection, vascular injury, post operative pain, or contrast induced renal failure.  This procedure involves the use of X-rays and because of the nature of the planned procedure, it is possible that we will have prolonged use of X-ray fluoroscopy.  Potential radiation risks to you include (but are not limited to) the following: - A slightly elevated risk for cancer several years later in life. This risk is typically less than 0.5% percent. This risk is low in comparison to the normal incidence of human cancer, which is 33% for women and 50% for men according to the [place]. - Radiation induced injury can include skin redness, resembling a rash, tissue breakdown / ulcers and hair loss (which can be temporary or permanent).   The likelihood of either of these occurring depends on the difficulty of the procedure and whether you are sensitive to radiation due to previous procedures, disease, or genetic conditions.   IF your procedure requires a prolonged use of radiation, you will be notified and given written instructions for further action.  It is your responsibility to monitor the irradiated area for the 2 weeks following the procedure and to notify your physician if you are concerned that you have suffered a radiation induced injury.    All of the patient's questions were answered, patient is agreeable to proceed but would like for [place] to contact her brother who is POA for consent.   Thank you for allowing our service to participate in Jason Hauge 's care.  Electronically Signed: Murrell Redden, PA-C   10/14/2021, 12:12 PM      I spent a total of 40 Minutes in face to face in clinical consultation, greater than 50% of which was counseling/coordinating care for uterine artery embolization.

## 2021-10-14 NOTE — Consult Note (Addendum)
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 I am assisted in the assessment of this wound by the Bedside RN, Wound Treatment Associate J. Laurance Flatten, RN. Wound type:Pressure vs Moisture plus pressure Pressure Injury POA: Yes Measurement: 4cm x 4cm with small open area in center. Several other areas within this space have reepithelialized Wound SWV:TVNR, dry Drainage (amount, consistency, odor) None Periwound: intact with recently reepithelialized wounds (partial thickness) Dressing procedure/placement/frequency: I will implement an antimicrobial nonadherent (xeroform gauze, Lawson # [id number]) topped with dry gauze 2x2 and covered with a silicone foam once daily. Turning and repositioning are in place. Bilateral Prevalon Boots are in place.  An extrenal suction device for the management of urinary incontinence (PurWick) is in place.  [facility] nursing team will not follow, but will remain available to this patient, the nursing and medical teams.  Please re-consult if needed. Thanks, Maudie Flakes, MSN, RN, [place], Arther Abbott  Pager# [phone]

## 2021-10-14 NOTE — Progress Notes (Signed)
Pt extremely anxious and constantly yelling out or calling on the call light and constantly wants her brother to be called to come. I have reassured her multiple times that I have called her brother and that she is fine and that her yelling out is disruptive to the other patients. Pt is comfortable, safe and in her bed and has no immediate needs.  I have been in the room most of the time since she has been admitted. If not me, NTs have been with her.

## 2021-10-14 NOTE — Progress Notes (Signed)
Patient transferred to [facility] Bed 20. This RN gave report to [facility] on 6N. Patient transferred via carelink at 1338. Patient on Room Air. Vital Signs stable.

## 2021-10-14 NOTE — Progress Notes (Signed)
Pt admitted to 6N20 from [facility] via [place]. Stage 2 on left buttock cheek, will measure later. Ordered air mattress. Bother in at bedside. Purewick in place.

## 2021-10-14 NOTE — Progress Notes (Signed)
PROGRESS NOTE    Holly Bradley  MCN:[id number] DOB: [birth year] DOA: 10/09/2021 PCP: Mitzi Hansen, MD    Brief Narrative:  45 y.o. female with history of CVA with hx of fibroids, left-sided hemiplegia, SVT, CHF, chronic anemia, hypertension was brought to the ER after patient's hemoglobin was found to be low and patient also has been having continuous menorrhagia.  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 Difficult situation where pt is deemed not a surgical candidate given co morbidities. Gyn recs for either Mirena IUD vs [place] if rebleeding  Case had been discussed with IR who recommends fibroid embolization this week. Pt has since been transferred to [facility] in anticipation for procedure Have updated Gyn regarding transfer and their service will be remain on standby Would continue to follow CBC trends Fever with concern developing sepsis for which blood cultures were obtained and started on empiric antibiotics. UA notable only for blood, correlating to presenting menorrhagia CXR unremarkable Blood cx only pos for 1/2 staph hominis, likely contaminant Empiric abx have been discontinued Pt has since remained stable off abx Uncontrolled hypertension Was continued on clonidine hydralazine coverage on Imdur Entresto  Continue with IV hydralazine as needed Continue to titrate bp meds as  tolerated with goal of normotension. Avoid overcorrection of BP given stroke hx Prior history of CVA with left-sided hemiplegia. Chart reviewed. Very difficult situation with concerns for suspected underlying hypercoag state Pt was previously on ASA, which she tolerated. Found to be hypercoagulable with Neurology later prescribing eliquis prior to onset of above menorrhagia Eliquis is currently held secondary to presenting menorrhagia above Appreciate Hematology input on suspected hypercoag state with repeat hypercoag panel pending Given concerns of significant acute blood loss anemia, consult placed for Neurology/Stroke team to assess risk/benefits to anticoagulation LLE DVT Large DVT in LLE reviewed on doppler Discussed with radiology. DVT likely secondary to compressive mass effect of large uterus Have ordered CTA abd/pelvis per IR recs to better assess flow History of CHF  Tolerated recent PRBC transfusion Appears euvolemic on exam today History of SVT on amiodarone. Hemodynamically stable   DVT prophylaxis: SCD's Code Status: No CPR Family Communication: Pt in room, have been updating pt's POA (Pt's brother) at contact listed in EPIC  Status is: Inpatient  Remains inpatient appropriate because: acute illness needing IR procedure    Consultants:  Ob/Gyn Hematology IR Neurology/Stroke Palliative Care  Procedures:    Antimicrobials: Anti-infectives (From admission, onward)    Start     Dose/Rate Route Frequency Ordered Stop   10/11/21 1200  ceFEPIme (MAXIPIME) 2 g in sodium chloride 0.9 % 100 mL IVPB  Status:  Discontinued        2 g 200 mL/hr over 30 Minutes Intravenous Every 8 hours 10/11/21 1018 10/12/21 2030   10/10/21 2200  vancomycin (VANCOREADY) IVPB 1250 mg/250 mL  Status:  Discontinued        1,250  mg 166.7 mL/hr over 90 Minutes Intravenous Every 24 hours 10/10/21 0402 10/12/21 2030   10/10/21 1200  ceFEPIme (MAXIPIME) 2 g in sodium chloride 0.9 % 100 mL IVPB   Status:  Discontinued        2 g 200 mL/hr over 30 Minutes Intravenous Every 12 hours 10/10/21 0402 10/11/21 1018   10/09/21 2345  ceFEPIme (MAXIPIME) 2 g in sodium chloride 0.9 % 100 mL IVPB        2 g 200 mL/hr over 30 Minutes Intravenous  Once 10/09/21 2343 10/10/21 0046   10/09/21 2345  metroNIDAZOLE (FLAGYL) IVPB 500 mg        500 mg 100 mL/hr over 60 Minutes Intravenous  Once 10/09/21 2343 10/10/21 0135   10/09/21 2345  vancomycin (VANCOCIN) IVPB 1000 mg/200 mL premix        1,000 mg 200 mL/hr over 60 Minutes Intravenous  Once 10/09/21 2343 10/10/21 0135       Subjective: Eager to be transferred to [facility]  Objective: Vitals:   10/14/21 0600 10/14/21 0619 10/14/21 0800 10/14/21 0826  BP:  (!) 174/84 (!) 191/66   Pulse: 60 60 (!) 54   Resp:  12 13   Temp:    97.7 F (36.5 C)  TempSrc:    Oral  SpO2:  100% 100%   Weight:      Height:        Intake/Output Summary (Last 24 hours) at 10/14/2021 1125 Last data filed at 10/14/2021 0620 Gross per 24 hour  Intake --  Output 500 ml  Net -500 ml    Filed Weights   10/09/21 2215 10/10/21 0200  Weight: 68 kg 57.5 kg    Examination: General exam: Conversant, in no acute distress Respiratory system: normal chest rise, clear, no audible wheezing Cardiovascular system: regular rhythm, s1-s2 Gastrointestinal system: Nondistended, nontender, pos BS Central nervous system: No seizures, no tremors Extremities: No cyanosis, no joint deformities Skin: No rashes, no pallor Psychiatry: Affect normal // no auditory hallucinations   Data Reviewed: I have personally reviewed following labs and imaging studies  CBC: Recent Labs  Lab 10/09/21 2241 10/09/21 2258 10/10/21 0003 10/10/21 1244 10/11/21 0229 10/11/21 1349 10/12/21 0247 10/13/21 0715 10/14/21 0853  WBC 9.1 9.2  --  9.0 6.0  --  8.2 7.4 6.4  NEUTROABS 7.4 7.7  --   --   --   --   --   --   --   HGB 3.0* 2.9*   < > 11.1* 8.1* 9.1* 9.9* 9.3* 9.4*  HCT 12.0* 11.1*    < > 35.1* 26.5* 28.7* 32.8* 29.9* 30.5*  MCV 84.5 80.4  --  87.3 86.9  --  87.5 86.2 86.4  PLT 336 336   < > 332 247  --  247 287 287   < > = values in this interval not displayed.    Basic Metabolic Panel: Recent Labs  Lab 10/09/21 2241 10/11/21 0229 10/12/21 0247 10/13/21 0715 10/14/21 0853  NA 140 138 137 137 137  K 3.9 3.8 4.3 4.0 4.1  CL 111 110 109 112* 110  CO2 20* 22 21* 17* 20*  GLUCOSE 98 99 95 81 84  BUN 20 17 19 18 15   CREATININE 1.14* 0.84 0.77 0.60 0.54  CALCIUM 9.3 9.1 9.4 9.8 9.8    GFR: Estimated Creatinine Clearance: 78.2 mL/min (by C-G formula based on SCr of 0.54 mg/dL). Liver Function Tests: Recent Labs  Lab 10/09/21 2241 10/11/21 0229 10/12/21 0247  10/13/21 0715 10/14/21 0853  AST 17 11* 25 18 17   ALT 10 8 11 11 11   ALKPHOS 104 90 105 97 108  BILITOT 0.6 1.0 0.8 0.7 0.8  PROT 7.0 6.1* 7.0 6.5 6.6  ALBUMIN 3.3* 2.9* 3.0* 2.9* 3.0*    No results for input(s): LIPASE, AMYLASE in the last 168 hours. No results for input(s): AMMONIA in the last 168 hours. Coagulation Profile: Recent Labs  Lab 10/09/21 2300 10/10/21 0018 10/10/21 1244  INR 1.7* 1.7* 1.3*    Cardiac Enzymes: No results for input(s): CKTOTAL, CKMB, CKMBINDEX, TROPONINI in the last 168 hours. BNP (last 3 results) No results for input(s): PROBNP in the last 8760 hours. HbA1C: No results for input(s): HGBA1C in the last 72 hours. CBG: Recent Labs  Lab 10/10/21 0144  GLUCAP 113*    Lipid Profile: No results for input(s): CHOL, HDL, LDLCALC, TRIG, CHOLHDL, LDLDIRECT in the last 72 hours. Thyroid Function Tests: No results for input(s): TSH, T4TOTAL, FREET4, T3FREE, THYROIDAB in the last 72 hours. Anemia Panel: No results for input(s): VITAMINB12, FOLATE, FERRITIN, TIBC, IRON, RETICCTPCT in the last 72 hours.  Sepsis Labs: Recent Labs  Lab 10/09/21 2241 10/09/21 2259  LATICACIDVEN 0.6 0.6     Recent Results (from the past 240 hour(s))  Resp Panel by RT-PCR  (Flu A&B, Covid) Nasopharyngeal Swab     Status: None   Collection Time: 10/09/21  9:52 PM   Specimen: Nasopharyngeal Swab; Nasopharyngeal(NP) swabs in vial transport medium  Result Value Ref Range Status   SARS Coronavirus 2 by RT PCR NEGATIVE NEGATIVE Final    Comment: (NOTE) SARS-CoV-2 target nucleic acids are NOT DETECTED.  The SARS-CoV-2 RNA is generally detectable in upper respiratory specimens during the acute phase of infection. The lowest concentration of SARS-CoV-2 viral copies this assay can detect is 138 copies/mL. A negative result does not preclude SARS-Cov-2 infection and should not be used as the sole basis for treatment or other patient management decisions. A negative result may occur with  improper specimen collection/handling, submission of specimen other than nasopharyngeal swab, presence of viral mutation(s) within the areas targeted by this assay, and inadequate number of viral copies(<138 copies/mL). A negative result must be combined with clinical observations, patient history, and epidemiological information. The expected result is Negative.  Fact Sheet for Patients:  EntrepreneurPulse.com.au  Fact Sheet for Healthcare Providers:  IncredibleEmployment.be  This test is no t yet approved or cleared by the [place] FDA and  has been authorized for detection and/or diagnosis of SARS-CoV-2 by FDA under an Emergency Use Authorization (EUA). This EUA will remain  in effect (meaning this test can be used) for the duration of the COVID-19 declaration under Section 564(b)(1) of the Act, 21 U.S.C.section 360bbb-3(b)(1), unless the authorization is terminated  or revoked sooner.       Influenza A by PCR NEGATIVE NEGATIVE Final   Influenza B by PCR NEGATIVE NEGATIVE Final    Comment: (NOTE) The Xpert Xpress SARS-CoV-2/FLU/RSV plus assay is intended as an aid in the diagnosis of influenza from Nasopharyngeal swab specimens  and should not be used as a sole basis for treatment. Nasal washings and aspirates are unacceptable for Xpert Xpress SARS-CoV-2/FLU/RSV testing.  Fact Sheet for Patients: EntrepreneurPulse.com.au  Fact Sheet for Healthcare Providers: IncredibleEmployment.be  This test is not yet approved or cleared by the [place] FDA and has been authorized for detection and/or diagnosis of SARS-CoV-2 by FDA under an Emergency Use Authorization (EUA). This EUA will remain  in effect (meaning this test can be used) for the duration of the COVID-19 declaration under Section 564(b)(1) of the Act, 21 U.S.C. section 360bbb-3(b)(1), unless the authorization is terminated or revoked.  Performed at [facility], [place] [address], [place], [place] [address]   Blood Culture (routine x 2)     Status: Abnormal   Collection Time: 10/09/21 10:27 PM   Specimen: BLOOD  Result Value Ref Range Status   Specimen Description   Final    BLOOD BLOOD RIGHT HAND Performed at [place] [address] [place], [place] [address]    Special Requests   Final    BOTTLES DRAWN AEROBIC AND ANAEROBIC Blood Culture adequate volume Performed at [place] [address], [place], [place] [address]    Culture  Setup Time   Final    GRAM POSITIVE COCCI ANAEROBIC BOTTLE ONLY CRITICAL RESULT CALLED TO, READ BACK BY AND VERIFIED WITH: PHARMD ELLEN JACKSON 10/11/21@1 :32 BY TW    Culture (A)  Final    STAPHYLOCOCCUS HOMINIS THE SIGNIFICANCE OF ISOLATING THIS ORGANISM FROM A SINGLE SET OF BLOOD CULTURES WHEN MULTIPLE SETS ARE DRAWN IS UNCERTAIN. PLEASE NOTIFY THE MICROBIOLOGY DEPARTMENT WITHIN ONE WEEK IF SPECIATION AND SENSITIVITIES ARE REQUIRED. Performed at [facility], [place] [address], [place], [place] [address]    Report Status 10/12/2021 FINAL  Final  Blood Culture ID Panel (Reflexed)     Status: Abnormal    Collection Time: 10/09/21 10:27 PM  Result Value Ref Range Status   Enterococcus faecalis NOT DETECTED NOT DETECTED Final   Enterococcus Faecium NOT DETECTED NOT DETECTED Final   Listeria monocytogenes NOT DETECTED NOT DETECTED Final   Staphylococcus species DETECTED (A) NOT DETECTED Final    Comment: CRITICAL RESULT CALLED TO, READ BACK BY AND VERIFIED WITH: PHARMD ELLEN JACKSON 10/11/21@1 :56 BY TW    Staphylococcus aureus (BCID) NOT DETECTED NOT DETECTED Final   Staphylococcus epidermidis NOT DETECTED NOT DETECTED Final   Staphylococcus lugdunensis NOT DETECTED NOT DETECTED Final   Streptococcus species NOT DETECTED NOT DETECTED Final   Streptococcus agalactiae NOT DETECTED NOT DETECTED Final   Streptococcus pneumoniae NOT DETECTED NOT DETECTED Final   Streptococcus pyogenes NOT DETECTED NOT DETECTED Final   A.calcoaceticus-baumannii NOT DETECTED NOT DETECTED Final   Bacteroides fragilis NOT DETECTED NOT DETECTED Final   Enterobacterales NOT DETECTED NOT DETECTED Final   Enterobacter cloacae complex NOT DETECTED NOT DETECTED Final   Escherichia coli NOT DETECTED NOT DETECTED Final   Klebsiella aerogenes NOT DETECTED NOT DETECTED Final   Klebsiella oxytoca NOT DETECTED NOT DETECTED Final   Klebsiella pneumoniae NOT DETECTED NOT DETECTED Final   Proteus species NOT DETECTED NOT DETECTED Final   Salmonella species NOT DETECTED NOT DETECTED Final   Serratia marcescens NOT DETECTED NOT DETECTED Final   Haemophilus influenzae NOT DETECTED NOT DETECTED Final   Neisseria meningitidis NOT DETECTED NOT DETECTED Final   Pseudomonas aeruginosa NOT DETECTED NOT DETECTED Final   Stenotrophomonas maltophilia NOT DETECTED NOT DETECTED Final   Candida albicans NOT DETECTED NOT DETECTED Final   Candida auris NOT DETECTED NOT DETECTED Final   Candida glabrata NOT DETECTED NOT DETECTED Final   Candida krusei NOT DETECTED NOT DETECTED Final   Candida parapsilosis NOT DETECTED NOT DETECTED Final    Candida tropicalis NOT DETECTED NOT DETECTED Final   Cryptococcus neoformans/gattii NOT DETECTED NOT DETECTED Final    Comment: Performed at [facility], [address] [place], [place]  [address]  Blood Culture (routine x 2)     Status: None (Preliminary result)   Collection Time: 10/09/21 10:59 PM   Specimen: BLOOD  Result Value Ref Range Status   Specimen Description   Final    BLOOD BLOOD RIGHT HAND Performed at [place] [address] [place], [place] [address]    Special Requests   Final    BOTTLES DRAWN AEROBIC AND ANAEROBIC Blood Culture results may not be optimal due to an excessive volume of blood received in culture bottles Performed at [place] [address] [place], [place] [address]    Culture   Final    NO GROWTH 3 DAYS Performed at [facility], [place] [address], [place], [place] [address]    Report Status PENDING  Incomplete  MRSA Next Gen by PCR, Nasal     Status: None   Collection Time: 10/10/21  2:02 PM  Result Value Ref Range Status   MRSA by PCR Next Gen NOT DETECTED NOT DETECTED Final    Comment: (NOTE) The GeneXpert MRSA Assay (FDA approved for NASAL specimens only), is one component of a comprehensive MRSA colonization surveillance program. It is not intended to diagnose MRSA infection nor to guide or monitor treatment for MRSA infections. Test performance is not FDA approved in patients less than 77 years old. Performed at [facility], [place] [address] [place], [place] [address]   Urine Culture     Status: None   Collection Time: 10/10/21  2:33 PM   Specimen: In/Out Cath Urine  Result Value Ref Range Status   Specimen Description   Final    IN/OUT CATH URINE Performed at [place] [address] [place], [place] [address]    Special Requests   Final    NONE Performed at [facility], [place] [address] [place] [address], [place], [place]  [address]    Culture   Final    NO GROWTH Performed at [facility], [place] [address], [place], [place] [address]    Report Status 10/11/2021 FINAL  Final      Radiology Studies: VAS [place] IVC/ILIAC (VENOUS ONLY)  Result Date: 10/13/2021 a  IVC/ILIAC STUDY Patient Name:  ADAYSHA DUBINSKY  Date of Exam:   10/12/2021 Medical Rec #: [id number]         Accession #:    [id number] Date of Birth: [birth year]         Patient Gender: F Patient Age:   13 years Exam Location:  [facility] Procedure:      VAS [place] IVC/ILIAC (VENOUS ONLY) Referring Phys: --------------------------------------------------------------------------------  Limitations: Known massive uterine mass displacing anatomical structures and limiting visualization.  Comparison Study: 05/02/2021- negative lower extremity venous duplex Performing Technologist: Maudry Mayhew MHA, RDMS, RVT, RDCS  Examination Guidelines: A complete evaluation includes B-mode imaging, spectral Doppler, color Doppler, and power Doppler as needed of all accessible portions of each vessel. Bilateral testing is considered an integral part of a complete examination. Limited examinations for reoccurring indications may be performed as noted.  IVC/Iliac Findings: +----------+------+--------+------------------------------+    IVC    PatentThrombus           Comments            +----------+------+--------+------------------------------+ IVC Prox                Unable to adequately visualize +----------+------+--------+------------------------------+ IVC Mid  Unable to adequately visualize +----------+------+--------+------------------------------+ IVC Distalpatent                                       +----------+------+--------+------------------------------+  +----------------+---------+-----------+---------+-----------------+--------+       CIV       RT-PatentRT-ThrombusLT-Patent   LT-Thrombus   Comments  +----------------+---------+-----------+---------+-----------------+--------+ Common Iliac Mid                             age indeterminate         +----------------+---------+-----------+---------+-----------------+--------+  +--------------------+---------+-----------+---------+----------------+--------+         EIV         RT-PatentRT-ThrombusLT-Patent  LT-Thrombus   Comments +--------------------+---------+-----------+---------+----------------+--------+ External Iliac Vein                                    age                Mid                                               indeterminate           +--------------------+---------+-----------+---------+----------------+--------+   Summary: IVC/Iliac: There is evidence of age indeterminate thrombus involving the left common iliac vein. There is evidence of age indeterminate thrombus involving the left external iliac vein. Distal IVC is patent, although appears potentially compressed. Visualization of proximal Inferior Vena Cava and mid inferior vena cava was limited. Unable to visualize the right common iliac and external iliac veins due to uterine mass.  *See table(s) above for measurements and observations.  Electronically signed by Deitra Mayo MD on 10/13/2021 at 5:15:17 AM.    Final    VAS [place] LOWER EXTREMITY VENOUS (DVT)  Result Date: 10/13/2021  Lower Venous DVT Study Patient Name:  AAYLIAH ROTENBERRY  Date of Exam:   10/12/2021 Medical Rec #: [id number]         Accession #:    [id number] Date of Birth: 16-Nov-1976         Patient Gender: F Patient Age:   5 years Exam Location:  [facility] Procedure:      VAS [place] LOWER EXTREMITY VENOUS (DVT) Referring Phys: Leray Garverick --------------------------------------------------------------------------------  Indications: Edema.  Comparison Study: 05/02/2020- negative lower extremity venous duplex Performing Technologist: Maudry Mayhew MHA, RDMS, RVT, RDCS   Examination Guidelines: A complete evaluation includes B-mode imaging, spectral Doppler, color Doppler, and power Doppler as needed of all accessible portions of each vessel. Bilateral testing is considered an integral part of a complete examination. Limited examinations for reoccurring indications may be performed as noted. The reflux portion of the exam is performed with the patient in reverse Trendelenburg.  +-----+---------------+---------+-----------+----------+--------------+ RIGHTCompressibilityPhasicitySpontaneityPropertiesThrombus Aging +-----+---------------+---------+-----------+----------+--------------+ CFV  Full           Yes                 patent                   +-----+---------------+---------+-----------+----------+--------------+   +---------+---------------+---------+-----------+----------+-------------------+ LEFT     CompressibilityPhasicitySpontaneityPropertiesThrombus Aging      +---------+---------------+---------+-----------+----------+-------------------+ CFV      None  No                   Age Indeterminate   +---------+---------------+---------+-----------+----------+-------------------+ SFJ      None                                         Age Indeterminate   +---------+---------------+---------+-----------+----------+-------------------+ FV Prox  Full                               patent                        +---------+---------------+---------+-----------+----------+-------------------+ FV Mid   None                                         Age Indeterminate   +---------+---------------+---------+-----------+----------+-------------------+ FV DistalFull                               patent                        +---------+---------------+---------+-----------+----------+-------------------+ PFV      Full                               patent                         +---------+---------------+---------+-----------+----------+-------------------+ POP      Full           No       Yes        patent                        +---------+---------------+---------+-----------+----------+-------------------+ PTV      Full                               patent                        +---------+---------------+---------+-----------+----------+-------------------+ PERO     Full                               patent                        +---------+---------------+---------+-----------+----------+-------------------+ EIV                              Yes                  partial age                                                               indeterminate  thrombus            +---------+---------------+---------+-----------+----------+-------------------+ CIV                              No                   Age Indeterminate   +---------+---------------+---------+-----------+----------+-------------------+   Left Technical Findings: Not visualized segments include Limited visualization of PTV and peroneal veins.   Summary: RIGHT: - No evidence of common femoral vein obstruction.  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  *See table(s) above for measurements and observations. Electronically signed by Deitra Mayo MD on 10/13/2021 at 5:15:36 AM.    Final    CT Angio Abd/Pel w/ and/or w/o  Result Date: 10/13/2021 CLINICAL DATA:  Vaginal bleeding, history of uterine fibroids. EXAM: CTA ABDOMEN AND PELVIS WITHOUT AND WITH CONTRAST TECHNIQUE: Multidetector CT imaging of the abdomen and pelvis was performed using the standard protocol during bolus administration of intravenous contrast. Multiplanar reconstructed images and MIPs were  obtained and reviewed to evaluate the vascular anatomy. CONTRAST:  175mL OMNIPAQUE IOHEXOL 350 MG/ML SOLN COMPARISON:  CT abdomen/pelvis 01/11/2021 FINDINGS: VASCULAR Aorta: Mild atherosclerotic calcifications along the abdominal aorta. No evidence of aneurysm or dissection. Celiac: Patent without evidence of aneurysm, dissection, vasculitis or significant stenosis. SMA: Patent without evidence of aneurysm, dissection, vasculitis or significant stenosis. Renals: Both renal arteries are patent without evidence of aneurysm, dissection, vasculitis, fibromuscular dysplasia or significant stenosis. IMA: Patent without evidence of aneurysm, dissection, vasculitis or significant stenosis. Inflow: Patent without evidence of aneurysm, dissection, vasculitis or significant stenosis. Proximal Outflow: Bilateral common femoral and visualized portions of the superficial and profunda femoral arteries are patent without evidence of aneurysm, dissection, vasculitis or significant stenosis. Other: Hypertrophic uterine arteries bilaterally supplying the massively enlarged uterus. The origins of both uterine arteries are easily identified arising from the anterior divisions of the internal iliac arteries bilaterally. Additionally, both ovarian arteries are slightly hyperplastic and likely providing supply to the fibroid uterus. The origins of the ovarian arteries can be identified on the coronal reformatted images. Veins: No obvious venous abnormality within the limitations of this arterial phase study. Review of the MIP images confirms the above findings. NON-VASCULAR Lower chest: Cardiomegaly.  Small right pleural effusion. Hepatobiliary: No focal liver abnormality is seen. No gallstones, gallbladder wall thickening, or biliary dilatation. Pancreas: Unremarkable. No pancreatic ductal dilatation or surrounding inflammatory changes. Spleen: Normal in size without focal abnormality. Adrenals/Urinary Tract: Adrenal glands are  unremarkable. Kidneys are normal, without renal calculi, focal lesion, or hydronephrosis. Bladder is unremarkable. Mild lobularity of the kidneys without focal defect or scarring. Stomach/Bowel: Stomach is within normal limits. Appendix appears normal. No evidence of bowel wall thickening, distention, or inflammatory changes. Lymphatic: No suspicious lymphadenopathy. Reproductive: Massively enlarged multi fibroid uterus measuring approximately 19.6 x 16.4 x 22 cm (volume = 3700 cm^3). No evidence of active arterial bleeding at this time. Many of the fibroids are heterogeneous and some demonstrate areas of hypo vascularity consistent with regions of prior degeneration. Dystrophic calcifications are present bilaterally. At least 1 pedunculated subserosal fibroid arising from the left aspect of the uterus. No definitive ovarian mass. Other: Trace pelvic ascites is likely within normal limits for reproductive age female. Musculoskeletal: No acute or significant osseous findings. IMPRESSION: 1. Massive multi fibroid uterus  with a prominent pedunculated subserosal fibroid arising from the left aspect of the uterine body. Approximate size of the uterus is 3.7 kg. 2. No evidence of active arterial bleeding at this time. 3. Urine arterial supply via bilateral hypertrophic uterine arteries arising from their typical anatomical position as well as bilateral hypertrophic ovarian arteries (see coronal reformatted images to identify vessel origins). 4. Mild atherosclerotic calcifications without focal stenosis or occlusion. 5. Cardiomegaly consistent with history of CHF. 6. Small right pleural effusion. Electronically Signed   By: Jacqulynn Cadet M.D.   On: 10/13/2021 07:18    Scheduled Meds:  amiodarone  200 mg Oral Daily   atorvastatin  40 mg Oral Daily   baclofen  20 mg Oral TID   carvedilol  25 mg Oral BID   Chlorhexidine Gluconate Cloth  6 each Topical Daily   cloNIDine  0.1 mg Oral TID   feeding supplement  237  mL Oral TID BM   hydrALAZINE  100 mg Oral TID   isosorbide mononitrate  60 mg Oral Daily   mouth rinse  15 mL Mouth Rinse BID   megestrol  40 mg Oral BID   nystatin  5 mL Oral QID   sacubitril-valsartan  1 tablet Oral BID   Continuous Infusions:  sodium chloride 10 mL/hr at 10/12/21 1506     LOS: 4 days   Marylu Lund, MD [facility] Pager On Amion  If 7PM-7AM, please contact night-coverage 10/14/2021, 11:25 AM

## 2021-10-14 NOTE — Progress Notes (Addendum)
TRANSFER NOTE  Course to date:  45 y.o. 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 Hg stable at  ~9 off of Eliquis, with no signs of active bleeding. Consult placed for Neurology/Stroke team to assess risk/benefits to anticoagulation. Hematology consulted at [facility], repeat hypercoag panel pending. She is transferred to [facility] for potential IR uterine artery embolization next week as not an appropriate candidate for hysterectomy because of multiple comorbidity. LLE DVT, likely 2/2 compressive mass effect of large uterus. There was initially a concern for sepsis development, started on broad spectrum Abx, blood cx negative, and empiric abx discontinued. .   Subjective:  Pt transported to [facility] from [facility] at 2 pm, and was evaluated shortly after by IMTS.   Pt reports feeling in pain due to left buttock wound, and pain over her anterior left leg. Pt denies chest pain, abdominal pain, and headaches. There is no signs of active bleeding. No other complaints or concerns at this time.   She would like her [place] to get in contact with her brother, and update him on the plan. Her brother is called, and he is updated.   Objective:  Vital signs in last 24 hours: Vitals:   10/14/21 1000 10/14/21 1100 10/14/21 1200 10/14/21 1300  BP: (!) 153/70  (!) 146/89   Pulse: 70 69 69 79  Resp: 12 18 15 18   Temp:      TempSrc:      SpO2: 100% 100% 100% 100%  Weight:      Height:       Constitutional: alert, well-appearing, in NAD HENT: normocephalic, atraumatic, mucous membranes moist Eyes: conjunctiva non-erythematous, EOMI Cardiovascular: RRR, no m/r/g, non-edematous bilateral  LE Pulmonary/Chest: normal work of breathing on room air, LCTAB Abdominal: soft, non TTP, mildly distended abdomen  MSK: normal bulk and tone Neurological: A&O x 3, left-sided hemiplegia with left arm contracted Skin: warm and dry Psych: anxious, normal affect  Assessment/Plan:  Principal Problem:   Acute blood loss anemia Active Problems:   Menorrhagia   Uncontrolled hypertension   Hemorrhagic stroke (HCC)   Uterine fibroid   Left hemiparesis (HCC)   Lupus anticoagulant positive   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)   Pressure injury of skin  45 y.o. 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   Acute blood loss anemia 2/2 continuous menorragia from uterine fibroids, resolved S/p tranexamic acid in ED with 5 units PRBC's being transfused, in addition to infusion of FFP. No signs or symptoms of ongoing blood loss. Hg stable ~9 off of Eliquis, pending input from neurology. Pt is a poor surgical candidate given comorbidities; GYN recs for either Mirena IUD vs [place] if rebleeding. IR consulted, possible fibroid embolization this week.  - IR consulted, appreciate recommendations and potential intervention - Megace 40  bid  - CTM CBC, and transfuse when Hg < 7  Prior Hx of CVA with residual left sided hemiplegia in s/o lupus anticoagulant Was on ASA. And Eliquis, as found to be hypercoagulable (lupus anticoagulant) with Neurology starting Eliquis prior to onset of menorrhagia. - HOLD Eliquis  - Neuro consulted to assess risk/benefits to anticoagulation; f/u on recs - Hematology consulted at [facility], repeat hypercoag  panel pending - Atorvastatin 40 qd - Palliative consulted at [facility] for [place]  HTN SBP up to 190's, but most recent read 108/59. - titrate BP meds as tolerated with goal of normotension. Avoid overcorrection of BP given stroke hx.  - Entresto bid  - Clonidine 0.1 tid  - Hydralazine 100 tid  - Imdur 60 qd - Coreg 25 bid  LLE DVT Large DVT in LLE on doppler, discussed with radiology and likely 2/2 compressive mass effect of large uterus.  - IR following   Hx of CHF Tolerated 5 U pRBC's transfusion without signs of fluid overload and euvolemic on exam.    Hx of SVT on amiodorone  Hemodynamically stable - Continue amiodarone   Best Practice: Diet: Dys 3 diet  IVF: None,None VTE: none  Code: Partial code   Lajean Manes, MD 10/14/2021, 1:24 PM Pager: [phone] After 5pm on weekdays and 1pm on weekends: On Call pager [phone]

## 2021-10-14 NOTE — Progress Notes (Signed)
Pt took a short nap, reassured during shift report.

## 2021-10-15 DIAGNOSIS — G8194 Hemiplegia, unspecified affecting left nondominant side: Secondary | ICD-10-CM | POA: Diagnosis not present

## 2021-10-15 DIAGNOSIS — I619 Nontraumatic intracerebral hemorrhage, unspecified: Secondary | ICD-10-CM | POA: Diagnosis not present

## 2021-10-15 DIAGNOSIS — R760 Raised antibody titer: Secondary | ICD-10-CM | POA: Diagnosis not present

## 2021-10-15 LAB — CBC
HCT: 29.3 % — ABNORMAL LOW (ref 36.0–46.0)
Hemoglobin: 8.7 g/dL — ABNORMAL LOW (ref 12.0–15.0)
MCH: 26.1 pg (ref 26.0–34.0)
MCHC: 29.7 g/dL — ABNORMAL LOW (ref 30.0–36.0)
MCV: 88 fL (ref 80.0–100.0)
Platelets: 269 10*3/uL (ref 150–400)
RBC: 3.33 MIL/uL — ABNORMAL LOW (ref 3.87–5.11)
RDW: 19.4 % — ABNORMAL HIGH (ref 11.5–15.5)
WBC: 5.5 10*3/uL (ref 4.0–10.5)
nRBC: 0 % (ref 0.0–0.2)

## 2021-10-15 LAB — CULTURE, BLOOD (ROUTINE X 2): Culture: NO GROWTH

## 2021-10-15 NOTE — Progress Notes (Addendum)
Subjective: Patient sleeping in the bed. No family present on rounds. She awakens easily. She is alert and oriented x 3. She denies any pain. Her abd is firm and distended, not tender on palpation. She states this is due to her uterine fibroids.   Neurology was consulted for  Guidance on anticoagulation for secondary stroke prevention in the context of recent admission for severe anemia secondary to continuous menorrhagia and history of atrial fibrillation and prior stroke with left hemiplegia.  Exam: Vitals:   10/15/21 0409 10/15/21 0928  BP: (!) 149/74 (!) 155/70  Pulse: 72 66  Resp: 18 18  Temp: (!) 97.5 F (36.4 C) 97.8 F (36.6 C)  SpO2: 100% 100%   Gen: In bed, NAD Resp: non-labored breathing, no acute distress Abd: firm, distended, non tender    Temp:  [97.5 F (36.4 C)-98.8 F (37.1 C)] 97.8 F (36.6 C) (10/24 0928) Pulse Rate:  [66-79] 66 (10/24 0928) Resp:  [15-18] 18 (10/24 0928) BP: (103-155)/(55-89) 155/70 (10/24 0928) SpO2:  [100 %] 100 % (10/24 0928)  General - frail looking middle-aged African-American lady in no apparent distress.  Ophthalmologic - fundi not visualized due to noncooperation.  Cardiovascular - Regular rhythm and rate.  Mental Status -  Level of arousal and orientation to time, place, and person were intact. Language including expression, naming, repetition, comprehension was assessed and found intact. Mild dysarthria  Short Attention span and concentration  Recent and remote memory were intact. Fund of Knowledge was assessed and was intact.  Cranial Nerves II - XII - II - left hemianopia. III, IV, VI - Extraocular movements intact. V - Facial sensation intact bilaterally. VII - left lower facial droop. VIII - Hearing & vestibular intact bilaterally. X - Palate elevates symmetrically. XI - Chin turning & shoulder shrug intact bilaterally. XII - Tongue protrusion intact.  Motor Strength - Left spastic hemiplegia with grade 1-2  strength the left upper extremity proximally and 0/5 strength distally.  0/5 strength in left lower extremity.  Tone is increased on the left with spasticity.  Nonfixed flexion contracture of the fingers and left wrist.  Right lower extremity can move well against gravity. Bulk was normal and fasciculations were absent.  Bilateral foot drop Motor Tone - increased tone on the left   Sensory - Light touch, temperature/pinprick were assessed and were symmetrical.    Coordination - The patient had normal movements in the hands and feet with no ataxia or dysmetria.  Tremor was absent.  Gait and Station - deferred.   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 Hg stable at  ~9 off of Eliquis, with no signs of active with anemia secondary to menorrhagia in the context of inoperable uterine fibroids.   Hematology consulted at [facility], repeat hypercoag panel pending. She has been transferred to [facility] for potential IR uterine artery embolization next week as not an appropriate candidate for hysterectomy because of multiple comorbidity  Plans for IR embolization of uterine fibroid.   Neurology was consulted for guidance on anticoagulation for secondary stroke prevention in the context of her atrial fibrillation and prior stroke with left hemiplegia  Recommendations  Would continue to hold Eliquis, if able to have IR embolization can consider restarting in 3 weeks   Holly Gandy, NP   STROKE MD NOTE :  I have personally obtained history,examined this patient, reviewed notes, independently viewed imaging studies, participated in medical decision making and  plan of care.ROS completed by me personally and pertinent positives fully documented  I have made any additions or clarifications directly to the above note. Agree with note above.  Patient with prior history of stroke with possible lupus anticoagulant on long-term Eliquis with history of significant metromenorrhagia requiring blood transfusion.  Agree with holding anticoagulation for now and await results of repeat hypercoagulable panel since there is a question that the previous abnormality was after she had her blood drawn while on Eliquis .Marland Kitchen  If she indeed does have lupus anticoagulant on present repeat testing as well she may remain at risk for future clotting episodes may consider restarting Eliquis once her source of recurring bleeding has been effectively treated.  No stroke related further work-up is needed at this time.  Greater than 50% time during this 35-minute visit was spent in counseling and coordination of care and discussion with care team and answering questions.  Stroke team will sign off.  Kindly call for questions.  Antony Contras, MD Medical Director [facility] Pager: [phone] 10/15/2021 12:57 PM

## 2021-10-15 NOTE — Progress Notes (Signed)
Patient refused for labs to be drawn today.  Patient did not give a reason why she did not want them to be drawn.  Educated patient on importance of needing to know what her blood levels are each day.  Patient still refused.

## 2021-10-15 NOTE — Progress Notes (Signed)
Daily Progress Note   [name]       Date: 10/15/2021 DOB: [birth year]  Age: 45 y.o. MRN#: [id number] Attending Physician: Holly Contes, MD Primary Care Physician: Holly Hansen, MD Admit Date: 10/09/2021  Reason for Consultation/Follow-up: Establishing goals of care  Subjective: I saw and examined Holly Bradley today.  She was awake and alert and lying in bed in no distress.  We reviewed care plan including awaiting final recommendations from the stroke team regarding anticoagulation and awaiting recommendation of IR regarding potential for uterine embolization.  Holly Bradley denied needs other than stating she wanted to eat something for lunch.  She asked me to call her brother, Holly Bradley, to discuss.  I called and was able to reach Holly Bradley.  We reviewed plan including potential for uterine artery embolization later this week.  We discussed that if plan is clear regarding recommendations for care moving forward, will proceed with [place] followed by potential restarting of Eliquis if recommended by stroke team.  If, however, she is not a good candidate for [place] or there are other questions regarding best plan of care moving forward, palliative care will be happy to further engage to discuss burdens and benefits of different options and how these pertain to her long-term goals of care.  At this point, however, Holly Bradley feels plan of care is clear with plan for [place] if possible later this week.  Length of Stay: 5  Current Medications: Scheduled Meds:  . amiodarone  200 mg Oral Daily  . atorvastatin  40 mg Oral Daily  . baclofen  20 mg Oral TID  . carvedilol  25 mg Oral BID  . Chlorhexidine Gluconate Cloth  6 each Topical Daily  . cloNIDine  0.1 mg Oral TID  . feeding supplement   237 mL Oral TID BM  . hydrALAZINE  100 mg Oral TID  . isosorbide mononitrate  60 mg Oral Daily  . mouth rinse  15 mL Mouth Rinse BID  . megestrol  40 mg Oral BID  . nystatin  5 mL Oral QID  . sacubitril-valsartan  1 tablet Oral BID    Continuous Infusions: . sodium chloride 10 mL/hr at 10/12/21 1506    PRN Meds: sodium chloride, acetaminophen **OR** acetaminophen, bisacodyl, hydrALAZINE, traMADol  Physical Exam   General: Alert, awake, in no  acute distress.   HEENT: No bruits, no goiter, no JVD Heart: Regular rate and rhythm. No murmur appreciated. Lungs: Good air movement, clear Abdomen: Soft, nontender, nondistended, positive bowel sounds.   Ext: No significant edema Skin: Warm and dry Neuro: Hemiaplegia, slow speech.         Vital Signs: BP 110/65 (BP Location: Right Arm)   Pulse 73   Temp 99.7 F (37.6 C) (Oral)   Resp 19   Ht 5\' 5"  (1.651 m)   Wt 57.5 kg   LMP  (LMP Unknown)   SpO2 100%   BMI 21.09 kg/m  SpO2: SpO2: 100 % O2 Device: O2 Device: Room Air O2 Flow Rate:    Intake/output summary:  Intake/Output Summary (Last 24 hours) at 10/15/2021 1657 Last data filed at 10/14/2021 2100 Gross per 24 hour  Intake 240 ml  Output 150 ml  Net 90 ml    LBM: Last BM Date: 10/14/21 Baseline Weight: Weight: 68 kg Most recent weight: Weight: 57.5 kg       Palliative Assessment/Data:    Flowsheet Rows    Flowsheet Row Most Recent Value  Intake Tab   Referral Department Hospitalist  Unit at Time of Referral ICU  Palliative Care Primary Diagnosis Neurology  Date Notified 10/12/21  Palliative Care Type Return patient Palliative Care  Reason for referral Clarify Goals of Care  Date of Admission 10/09/21  Date first seen by Palliative Care 10/12/21  # of days Palliative referral response time 0 Day(s)  # of days IP prior to Palliative referral 3  Clinical Assessment   Palliative Performance Scale Score 40%  Psychosocial & Spiritual Assessment    Palliative Care Outcomes   Patient/Family meeting held? Yes       Patient Active Problem List   Diagnosis Date Noted  . Pressure injury of skin 10/13/2021  . Lupus anticoagulant positive   . 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 ([place])   . Acute blood loss anemia 10/10/2021  . Hypertension   . Non-ischemic cardiomyopathy ([place])   . Swelling of left hand   . Proctocolitis   . Constipation   . Uterine fibroid   . Left hemiparesis ([place])   . Hemorrhagic stroke ([place])   . Abnormal uterine bleeding (AUB)   . Staphylococcus epidermidis bacteremia   . Iron deficiency anemia due to chronic blood loss 05/25/2021  . Cerebral edema ([place]) 05/25/2021  . Chronic HFrEF (heart failure with reduced ejection fraction) ([place]) 05/25/2021  . Pneumonia due to methicillin susceptible Staphylococcus aureus ([place]) 05/25/2021  . Acute respiratory failure with hypoxia ([place])   . Acute right MCA stroke ([place]) 05/15/2021  . Uncontrolled hypertension 05/15/2021  . Cerebrovascular accident (CVA) ([place]) 05/15/2021  . IDA (iron deficiency anemia) 03/01/2021  . Healthcare maintenance 03/01/2021  . Onychomycosis 03/01/2021  . Food insecurity 02/02/2021  . Psychosis ([place])   . Fibroid 01/11/2021  . Menorrhagia 01/11/2021  . Acute exacerbation of CHF (congestive heart failure) ([place]) 01/10/2021  . AKI (acute kidney injury) ([place])   . SVT (supraventricular tachycardia) ([place]) 05/01/2020  . CHF (congestive heart failure) ([place]) 05/01/2020    Palliative Care Assessment & Plan   Patient Profile: 45 y.o. female  with past medical history of CVA with left-sided hemiplegia, SVT, CHF, chronic anemia, hypertension, fibroids admitted on 10/09/2021 with anemia and continuous menorrhagia.  Her situation is difficult as she is on anticoagulation with Eliquis due to stroke but is having significant blood loss.  She was  evaluated by gynecology and oncology.  Still awaiting transfer to [facility] to obtain input  from neurology and IR.  Palliative consulted for goals of care.    Recommendations/Plan: No CPR in the event of cardiac arrest This remains a complicated situation.  Discussed again with patient's brother/HC POA, Holly Bradley.  Holly Bradley has a lot going on right now as his husband is also hospitalized, but he is always available as needed to help make decisions on Holly Bradley's behalf.  Plan clear if she is a candidate for [place] as he would like to proceed with this followed by resumption of anticoagulation if recommended by stroke team.  If, however, this is not going to be a viable option, our team would be happy to reengage to further discuss options and goals for her care.   At this point, palliative will continue to follow peripherally but not round on Holly Bradley daily.  Please call or reconsult if there are other palliative specific needs with which we can be of assistance for further conversation as needed regarding goals of care moving forward.  Code Status:    Code Status Orders  (From admission, onward)           Start     Ordered   10/10/21 0313  Limited resuscitation (code)  Continuous       Question Answer Comment  In the event of cardiac or respiratory ARREST: Initiate Code Blue, Call Rapid Response Yes   In the event of cardiac or respiratory ARREST: Perform CPR No   In the event of cardiac or respiratory ARREST: Perform Intubation/Mechanical Ventilation Yes   In the event of cardiac or respiratory ARREST: Use NIPPV/BiPAp only if indicated Yes   In the event of cardiac or respiratory ARREST: Administer ACLS medications if indicated Yes   In the event of cardiac or respiratory ARREST: Perform Defibrillation or Cardioversion if indicated Yes      10/10/21 0313           Code Status History     Date Active Date Inactive Code Status Order ID Comments User Context   05/30/2021 1101 07/04/2021 0522 Partial Code [id number]  Philis Pique, NP Inpatient   05/15/2021 0754 05/30/2021 1101  Full Code [id number]  Greta Doom, MD ED   02/01/2021 1644 02/02/2021 2005 Full Code [id number]  Ledora Bottcher, [place] [facility]   01/10/2021 1455 01/27/2021 2035 Full Code [id number]  Marty Heck, DO ED   05/01/2020 1644 05/03/2020 1813 Full Code [id number]  Lequita Halt, MD ED       Prognosis: Guarded  Discharge Planning: To be determined  Care plan was discussed with patient, RN, brother via phone  Thank you for allowing the Palliative Medicine Team to assist in the care of this patient.   Time In: 1210 Time Out: 1242 Total Time 32 Prolonged Time Billed No   Greater than 50%  of this time was spent counseling and coordinating care related to the above assessment and plan.   Micheline Rough, MD  Please contact Palliative Medicine Team phone at [phone] for questions and concerns.

## 2021-10-15 NOTE — Progress Notes (Addendum)
Subjective:  No acute overnight events. Patient was seen s/p IR [place] today.  Pt states that she feels "good" and is ready to eat something. She complains of back pain. Denies any new episodes of bleeding that she has noticed. She denies chest pain, SHOB, and abdominal pain.  No other complaints or concerns at this time.   Pt is updated on the plan for today, and all questions are addressed.   Objective:  Vital signs in last 24 hours: Vitals:   10/14/21 2059 10/15/21 0010 10/15/21 0409 10/15/21 0928  BP: 114/63 (!) 103/55 (!) 149/74 (!) 155/70  Pulse: 77 75 72 66  Resp: 18 16 18 18   Temp: 98.4 F (36.9 C) 98.5 F (36.9 C) (!) 97.5 F (36.4 C) 97.8 F (36.6 C)  TempSrc: Oral Oral Axillary Oral  SpO2: 100% 100% 100% 100%  Weight:      Height:       Constitutional: alert, well-appearing, in NAD HENT: normocephalic, atraumatic, mucous membranes moist Eyes: conjunctiva non-erythematous, EOMI Cardiovascular: RRR, no m/r/g, non-edematous bilateral LE Pulmonary/Chest: normal work of breathing on room air, LCTAB Abdominal: soft, mildly TTP distended abdomen  MSK: normal bulk and tone Neurological: A&O x 3, left-sided hemiplegia with left arm contracted Skin: warm and dry Psych: anxious, normal affect  Assessment/Plan:  Principal Problem:   Acute blood loss anemia Active Problems:   Menorrhagia   Uncontrolled hypertension   Hemorrhagic stroke (HCC)   Uterine fibroid   Left hemiparesis (HCC)   Lupus anticoagulant positive   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)   Pressure injury of skin  45 y.o. 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   Acute blood loss anemia 2/2 continuous menorragia from uterine fibroids, resolved Poor surgical candidate for hysterectomy given comorbidities, per GYN. Hg downtrending 8.7 -> 7.8 this AM, but now s/p IR [place]. Will CTM CBC. No signs of profuse bleeding or ongoing blood draining from [employer].  - HOLD Megace 40 bid and monitor for blood loss  - HOLD Eliquis  - IV NS 150 cc/hr + PO intake as tolerated  - Pain management  - 6 pm CBC and transfuse if Hg < 7  Prior Hx of CVA with residual left sided hemiplegia in s/o possible lupus anticoagulant Was on ASA and Eliquis per neurology as found to be hypercoagulable (LA); neuro consulted, recommending holding Eliquis in setting of repeat confirmatory hypercoag panel pending.  - HOLD Eliquis  - Hematology following, repeat hypercoag panel PENDING - Atorvastatin 40 qd - Palliative following peripherally   HTN SBP elevated postprocedure to the 200s.  Discussed with RN.  Patient had not received a.m. medications prior to procedure.  Patient is on clonidine and I suspect that patient is having rebound hypertension secondary to missing her morning dose.  Antihypertensive medications have now been given.  We will monitor closely to ensure improvement in blood pressure.  I also suspect patient is in some pain secondary to her procedure which may also be contributing to her hypertension. - titrate BP meds as tolerated with goal of normotension. Avoid overcorrection of BP given stroke hx.  -  Entresto bid  - Clonidine 0.1 tid  - Hydralazine 100 tid  - Imdur 60 qd - Coreg 25 bid  LLE DVT Large DVT in LLE on doppler, discussed with radiology and likely 2/2 compressive mass effect of large uterus.  - IR following   Hx of CHF Tolerated 5 U pRBC's transfusion without signs of fluid overload and euvolemic on exam.    Hx of SVT on amiodorone  Hemodynamically stable - Continue amiodarone    Best Practice: Diet: Dys  3 diet  IVF: None,None VTE: none  Code: Partial code   Lajean Manes, MD 10/15/2021, 11:37 AM Pager: [phone] After 5pm on weekdays and 1pm on weekends: On Call pager [phone]

## 2021-10-16 ENCOUNTER — Inpatient Hospital Stay (HOSPITAL_COMMUNITY): Payer: Medicaid - Out of State

## 2021-10-16 ENCOUNTER — Encounter (HOSPITAL_COMMUNITY): Payer: Self-pay | Admitting: Interventional Radiology

## 2021-10-16 DIAGNOSIS — D62 Acute posthemorrhagic anemia: Secondary | ICD-10-CM | POA: Diagnosis not present

## 2021-10-16 HISTORY — PX: IR AORTAGRAM ABDOMINAL SERIALOGRAM: IMG636

## 2021-10-16 HISTORY — PX: IR US GUIDE VASC ACCESS RIGHT: IMG2390

## 2021-10-16 HISTORY — PX: IR EMBO ARTERIAL NOT HEMORR HEMANG INC GUIDE ROADMAPPING: IMG5448

## 2021-10-16 LAB — PROTIME-INR
INR: 1.2 (ref 0.8–1.2)
Prothrombin Time: 15.7 seconds — ABNORMAL HIGH (ref 11.4–15.2)

## 2021-10-16 LAB — BASIC METABOLIC PANEL
Anion gap: 7 (ref 5–15)
BUN: 12 mg/dL (ref 6–20)
CO2: 21 mmol/L — ABNORMAL LOW (ref 22–32)
Calcium: 9.7 mg/dL (ref 8.9–10.3)
Chloride: 108 mmol/L (ref 98–111)
Creatinine, Ser: 0.66 mg/dL (ref 0.44–1.00)
GFR, Estimated: >60 mL/min (ref >60)
Glucose, Bld: 108 mg/dL — ABNORMAL HIGH (ref 70–99)
Potassium: 3.5 mmol/L (ref 3.5–5.1)
Sodium: 136 mmol/L (ref 135–145)

## 2021-10-16 LAB — CBC
HCT: 25.3 % — ABNORMAL LOW (ref 36.0–46.0)
HCT: 29.9 % — ABNORMAL LOW (ref 36.0–46.0)
Hemoglobin: 7.8 g/dL — ABNORMAL LOW (ref 12.0–15.0)
Hemoglobin: 9 g/dL — ABNORMAL LOW (ref 12.0–15.0)
MCH: 26.7 pg (ref 26.0–34.0)
MCH: 25.9 pg — ABNORMAL LOW (ref 26.0–34.0)
MCHC: 30.8 g/dL (ref 30.0–36.0)
MCHC: 30.1 g/dL (ref 30.0–36.0)
MCV: 86.6 fL (ref 80.0–100.0)
MCV: 85.9 fL (ref 80.0–100.0)
Platelets: 271 10*3/uL (ref 150–400)
Platelets: 283 10*3/uL (ref 150–400)
RBC: 2.92 MIL/uL — ABNORMAL LOW (ref 3.87–5.11)
RBC: 3.48 MIL/uL — ABNORMAL LOW (ref 3.87–5.11)
RDW: 19.3 % — ABNORMAL HIGH (ref 11.5–15.5)
RDW: 19.2 % — ABNORMAL HIGH (ref 11.5–15.5)
WBC: 5.4 10*3/uL (ref 4.0–10.5)
WBC: 8.7 10*3/uL (ref 4.0–10.5)
nRBC: 0 % (ref 0.0–0.2)
nRBC: 0 % (ref 0.0–0.2)

## 2021-10-16 LAB — MISC LABCORP TEST (SEND OUT): Labcorp test code: 117838

## 2021-10-16 LAB — LUPUS ANTICOAGULANT PANEL
DRVVT: 70.2 s — ABNORMAL HIGH (ref 0.0–47.0)
PTT Lupus Anticoagulant: 43.1 s (ref 0.0–51.9)

## 2021-10-16 LAB — DRVVT CONFIRM: dRVVT Confirm: 1.3 ratio — ABNORMAL HIGH (ref 0.8–1.2)

## 2021-10-16 LAB — DRVVT MIX: dRVVT Mix: 50.2 s — ABNORMAL HIGH (ref 0.0–40.4)

## 2021-10-16 MED ORDER — KETOROLAC TROMETHAMINE 30 MG/ML IJ SOLN
INTRAMUSCULAR | Status: AC
  Filled 2021-10-16: qty 2

## 2021-10-16 MED ORDER — MIDAZOLAM HCL 2 MG/2ML IJ SOLN
INTRAMUSCULAR | Status: AC
  Filled 2021-10-16: qty 2

## 2021-10-16 MED ORDER — SODIUM CHLORIDE 0.9 % IV SOLN
INTRAVENOUS | Status: AC
  Administered 2021-10-16: 2 g via INTRAVENOUS
  Filled 2021-10-16: qty 20

## 2021-10-16 MED ORDER — SODIUM CHLORIDE 0.9 % IV SOLN: 2.0000 g | Freq: Once | INTRAVENOUS | Status: AC

## 2021-10-16 MED ORDER — DIPHENHYDRAMINE HCL 50 MG/ML IJ SOLN: 12.5000 mg | Freq: Four times a day (QID) | INTRAMUSCULAR | Status: DC | PRN

## 2021-10-16 MED ORDER — MIDAZOLAM HCL 2 MG/2ML IJ SOLN
INTRAMUSCULAR | Status: AC | PRN
  Administered 2021-10-16: 1 mg via INTRAVENOUS
  Administered 2021-10-16 (×4): .5 mg via INTRAVENOUS

## 2021-10-16 MED ORDER — SODIUM CHLORIDE 0.9 % IV SOLN: INTRAVENOUS | Status: AC

## 2021-10-16 MED ORDER — KETOROLAC TROMETHAMINE 30 MG/ML IJ SOLN
30.0000 mg | Freq: Four times a day (QID) | INTRAMUSCULAR | Status: AC
  Administered 2021-10-16 – 2021-10-21 (×20): 30 mg via INTRAVENOUS
  Filled 2021-10-16 (×21): qty 1

## 2021-10-16 MED ORDER — NALOXONE HCL 0.4 MG/ML IJ SOLN: 0.4000 mg | INTRAMUSCULAR | Status: DC | PRN

## 2021-10-16 MED ORDER — KETOROLAC TROMETHAMINE 60 MG/2ML IM SOLN
INTRAMUSCULAR | Status: AC | PRN
  Administered 2021-10-16: 60 mg via INTRAMUSCULAR

## 2021-10-16 MED ORDER — ONDANSETRON HCL 4 MG/2ML IJ SOLN: 4.0000 mg | Freq: Four times a day (QID) | INTRAMUSCULAR | Status: DC | PRN

## 2021-10-16 MED ORDER — HYDRALAZINE HCL 20 MG/ML IJ SOLN
INTRAMUSCULAR | Status: AC
  Filled 2021-10-16: qty 1

## 2021-10-16 MED ORDER — IOHEXOL 300 MG/ML  SOLN: 100.0000 mL | Freq: Once | INTRAMUSCULAR | Status: DC | PRN

## 2021-10-16 MED ORDER — PROMETHAZINE HCL 25 MG PO TABS
25.0000 mg | ORAL_TABLET | Freq: Three times a day (TID) | ORAL | Status: DC | PRN
  Administered 2021-10-25: 25 mg via ORAL
  Filled 2021-10-16: qty 1

## 2021-10-16 MED ORDER — FENTANYL CITRATE (PF) 100 MCG/2ML IJ SOLN
INTRAMUSCULAR | Status: AC
  Filled 2021-10-16: qty 2

## 2021-10-16 MED ORDER — FENTANYL 50 MCG/ML IV PCA SOLN
INTRAVENOUS | Status: DC
  Administered 2021-10-16: 60 ug via INTRAVENOUS
  Administered 2021-10-17: 0 ug via INTRAVENOUS
  Administered 2021-10-17: 60 ug via INTRAVENOUS
  Administered 2021-10-17: 0 ug via INTRAVENOUS
  Filled 2021-10-16 (×2): qty 20

## 2021-10-16 MED ORDER — IOHEXOL 300 MG/ML  SOLN
100.0000 mL | Freq: Once | INTRAMUSCULAR | Status: AC | PRN
  Administered 2021-10-16: 75 mL via INTRA_ARTERIAL

## 2021-10-16 MED ORDER — PROMETHAZINE HCL 25 MG RE SUPP
25.0000 mg | Freq: Three times a day (TID) | RECTAL | Status: DC | PRN
  Filled 2021-10-16: qty 1

## 2021-10-16 MED ORDER — SODIUM CHLORIDE 0.9% FLUSH: 9.0000 mL | INTRAVENOUS | Status: DC | PRN

## 2021-10-16 MED ORDER — FENTANYL CITRATE (PF) 100 MCG/2ML IJ SOLN
INTRAMUSCULAR | Status: AC | PRN
  Administered 2021-10-16 (×6): 25 ug via INTRAVENOUS

## 2021-10-16 MED ORDER — DOCUSATE SODIUM 100 MG PO CAPS
100.0000 mg | ORAL_CAPSULE | Freq: Two times a day (BID) | ORAL | Status: DC
  Administered 2021-10-16 – 2021-10-19 (×7): 100 mg via ORAL
  Filled 2021-10-16 (×8): qty 1

## 2021-10-16 MED ORDER — HYDRALAZINE HCL 20 MG/ML IJ SOLN
INTRAMUSCULAR | Status: AC | PRN
  Administered 2021-10-16: 20 mg via INTRAVENOUS

## 2021-10-16 MED ORDER — IOHEXOL 300 MG/ML  SOLN
100.0000 mL | Freq: Once | INTRAMUSCULAR | Status: AC | PRN
  Administered 2021-10-21: 100 mL via INTRA_ARTERIAL

## 2021-10-16 MED ORDER — HYDROCODONE-ACETAMINOPHEN 5-325 MG PO TABS
1.0000 | ORAL_TABLET | ORAL | Status: DC | PRN
Start: 2021-10-16 — End: 2021-10-23
  Administered 2021-10-16: 1 via ORAL
  Administered 2021-10-18 (×2): 2 via ORAL
  Administered 2021-10-19: 1 via ORAL
  Administered 2021-10-19 – 2021-10-21 (×3): 2 via ORAL
  Administered 2021-10-23: 1 via ORAL
  Administered 2021-10-23: 2 via ORAL
  Filled 2021-10-16 (×2): qty 2
  Filled 2021-10-16: qty 1
  Filled 2021-10-16 (×2): qty 2
  Filled 2021-10-16: qty 1
  Filled 2021-10-16: qty 2
  Filled 2021-10-16: qty 1
  Filled 2021-10-16 (×2): qty 2

## 2021-10-16 MED ORDER — LIDOCAINE HCL 1 % IJ SOLN
INTRAMUSCULAR | Status: AC
  Filled 2021-10-16: qty 20

## 2021-10-16 MED ORDER — DIPHENHYDRAMINE HCL 12.5 MG/5ML PO ELIX: 12.5000 mg | ORAL_SOLUTION | Freq: Four times a day (QID) | ORAL | Status: DC | PRN

## 2021-10-16 MED ORDER — SODIUM CHLORIDE 0.9% FLUSH
3.0000 mL | Freq: Two times a day (BID) | INTRAVENOUS | Status: DC
  Administered 2021-10-16 – 2021-10-27 (×18): 3 mL via INTRAVENOUS

## 2021-10-16 MED ORDER — SODIUM CHLORIDE 0.9 % IV SOLN: 250.0000 mL | INTRAVENOUS | Status: DC | PRN

## 2021-10-16 MED ORDER — SODIUM CHLORIDE 0.9% FLUSH: 3.0000 mL | INTRAVENOUS | Status: DC | PRN

## 2021-10-16 NOTE — Sedation Documentation (Signed)
12:20 handoff with 6N, RN at bedside. Groin site level 0, dressing clean/dry/intact, pulses dopplered  2103 fem site closed with 5 fr Celt device then dressed with gauze and tegaderm.

## 2021-10-16 NOTE — Progress Notes (Signed)
PT Cancellation Note  Patient Details Name: Holly Bradley MRN: [id number] DOB: Jun 26, 1976   Cancelled Treatment:    Reason Eval/Treat Not Completed: Pain limiting ability to participate;Patient at procedure or test/unavailable.  PCA pump in room and nursing is setting up.  Retry as time and pt allow.   Ramond Dial 10/16/2021, 2:32 PM  Mee Hives, PT PhD Acute Rehab Dept. Number: [place] and [place]

## 2021-10-16 NOTE — Progress Notes (Addendum)
Subjective:  ON team paged by nurse for BP 125/76 HR 76, clonidine, coreg and hydralazine held.    Pt states that she feels "so-so." Her pain is 8.5/10 but she has not pressed for her pain medications. The pain is mostly involving her left shoulder. She is trying to not put too much weight on that side. She has noticed "a bit" more bleeding. She denies chest pain, SHOB, and abdominal pain.   No other complaints or concerns at this time.    Pt is updated on the plan for today, and all questions are addressed.    Objective:   Vital signs in last 24 hours:       Vitals:    10/14/21 2059 10/15/21 0010 10/15/21 0409 10/15/21 0928  BP: 114/63 (!) 103/55 (!) 149/74 (!) 155/70  Pulse: 77 75 72 66  Resp: 18 16 18 18   Temp: 98.4 F (36.9 C) 98.5 F (36.9 C) (!) 97.5 F (36.4 C) 97.8 F (36.6 C)  TempSrc: Oral Oral Axillary Oral  SpO2: 100% 100% 100% 100%  Weight:          Height:            Constitutional: alert, well-appearing, in NAD HENT: normocephalic, atraumatic, mucous membranes moist Eyes: conjunctiva non-erythematous, EOMI Cardiovascular: RRR, no m/r/g; trace pitting edema of the left lower extremity, non-edematous right lower extremity.  Pulmonary/Chest: normal work of breathing on room air, LCTAB Abdominal: soft, non TTP, distended abdomen  MSK: normal bulk and tone Neurological: A&O x 3, left-sided hemiplegia with left arm contracted Skin: warm and dry Psych: anxious, normal affect   Assessment/Plan:   Principal Problem:   Acute blood loss anemia Active Problems:   Menorrhagia   Uncontrolled hypertension   Hemorrhagic stroke (HCC)   Uterine fibroid   Left hemiparesis (HCC)   Lupus anticoagulant positive   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)   Pressure injury of skin   45 y.o. 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   Acute blood loss anemia 2/2 continuous menorragia from uterine fibroids, resolved Hg stable s/p IR [place] yesterday. Off of Megace; no signs of profuse bleeding, only small quantity of blood drained from [place]. Pt asymptomatic, vitals stable, and pain appropriately controlled.  - HOLD Megace 40 bid and CTM for blood loss - STOP Eliquis (see below) and CTM for blood loss  - CBC and transfuse if Hg < 7 - Pain management: scheduled tylenol, norco, toradol, PCA Dc'd - Routine wound care of right CFA puncture site until healed - 1 month outpatient IR follow up with Dr. Laurence Ferrari   Prior Hx of CVA with residual left sided hemiplegia in s/o possible lupus anticoagulant Was on ASA, and then ASA Eliquis per neurology as found to be hypercoagulable. Neuro consulted, recommending holding Eliquis until bleeding stabilizes and repeat confirmatory hypercoag panel results. Repeat panel shows Lupus Anticoagulant neg so would not need anticoagulation on this account, however anticoagulation recs for CVA would be based on risk vs benefit analysis per neurology, and pt follows with Dr Leonie Man in clinic.  - STOP Eliquis, as confirmatory LA negative  -  Will consider restarting ASA once bleeding stabilizes given hx of CVA  - Atorvastatin 40 qd - Palliative following peripherally    HTN SBP 160's; suspect pain and rebound hypertension from holding PM clonidine contributing to elevated pressures. Will CTM and adjust dose if necessary.  - titrate BP meds as tolerated with goal of normotension. Avoid overcorrection of BP given stroke hx.  - Entresto bid  - Clonidine 0.1 tid  - Hydralazine 100 tid  - Imdur 60 qd - Coreg 25 bid   LLE DVT Large DVT in LLE on doppler,  discussed with radiology and likely 2/2 compressive mass effect of large uterus.    Hx of CHF Tolerated 5 U pRBC's transfusion without signs of fluid overload and euvolemic on exam.     Hx of SVT on amiodorone  Hemodynamically stable - Continue amiodarone      Best Practice: Diet: Dys 3 diet  IVF: None,None VTE: none  Code: Partial code    Lajean Manes, MD 10/15/2021, 11:37 AM Pager: [phone] After 5pm on weekdays and 1pm on weekends: On Call pager [phone]

## 2021-10-16 NOTE — Procedures (Signed)
Interventional Radiology Procedure Note  Procedure: Successful uterine fibroid embolization procedure.   Complications: None  Estimated Blood Loss: None  Recommendations: - Bedrest with leg straight x 1 hr - Hydrate with IVFs at 150 mL/hr until tolerating excellent PO intake - ADAT - NSAIDS and Fentanyl PCA ordered for pain control    Signed,  Criselda Peaches, MD

## 2021-10-16 NOTE — Progress Notes (Signed)
Bp 125/76 (88) and HR 76, this RN made MD on call aware. Verbal order given to hold clonidine, coreg and hydralazine tablets scheduled for bedtime.

## 2021-10-17 DIAGNOSIS — D62 Acute posthemorrhagic anemia: Secondary | ICD-10-CM | POA: Diagnosis not present

## 2021-10-17 LAB — CBC
HCT: 25.9 % — ABNORMAL LOW (ref 36.0–46.0)
Hemoglobin: 7.8 g/dL — ABNORMAL LOW (ref 12.0–15.0)
MCH: 26 pg (ref 26.0–34.0)
MCHC: 30.1 g/dL (ref 30.0–36.0)
MCV: 86.3 fL (ref 80.0–100.0)
Platelets: 274 10*3/uL (ref 150–400)
RBC: 3 MIL/uL — ABNORMAL LOW (ref 3.87–5.11)
RDW: 19.5 % — ABNORMAL HIGH (ref 11.5–15.5)
WBC: 7.9 10*3/uL (ref 4.0–10.5)
nRBC: 0 % (ref 0.0–0.2)

## 2021-10-17 LAB — BASIC METABOLIC PANEL
Anion gap: 6 (ref 5–15)
BUN: 13 mg/dL (ref 6–20)
CO2: 19 mmol/L — ABNORMAL LOW (ref 22–32)
Calcium: 9.3 mg/dL (ref 8.9–10.3)
Chloride: 109 mmol/L (ref 98–111)
Creatinine, Ser: 0.78 mg/dL (ref 0.44–1.00)
GFR, Estimated: >60 mL/min (ref >60)
Glucose, Bld: 95 mg/dL (ref 70–99)
Potassium: 4.5 mmol/L (ref 3.5–5.1)
Sodium: 134 mmol/L — ABNORMAL LOW (ref 135–145)

## 2021-10-17 MED ORDER — ACETAMINOPHEN 325 MG PO TABS
325.0000 mg | ORAL_TABLET | ORAL | Status: DC
  Administered 2021-10-17 – 2021-10-20 (×12): 325 mg via ORAL
  Filled 2021-10-17 (×16): qty 1

## 2021-10-17 MED ORDER — ACETAMINOPHEN 650 MG RE SUPP: 650.0000 mg | RECTAL | Status: DC

## 2021-10-17 MED ORDER — ACETAMINOPHEN 650 MG RE SUPP: 650.0000 mg | Freq: Four times a day (QID) | RECTAL | Status: DC

## 2021-10-17 MED ORDER — ACETAMINOPHEN 325 MG PO TABS: 650.0000 mg | ORAL_TABLET | Freq: Four times a day (QID) | ORAL | Status: DC

## 2021-10-17 MED ORDER — APIXABAN 5 MG PO TABS
5.0000 mg | ORAL_TABLET | Freq: Two times a day (BID) | ORAL | Status: DC
  Administered 2021-10-18 – 2021-10-22 (×10): 5 mg via ORAL
  Filled 2021-10-17 (×10): qty 1

## 2021-10-17 NOTE — Progress Notes (Signed)
Referring Physician(s): Marylu Lund, MD  Supervising Physician: Arne Cleveland  Patient Status:  [facility] - In-pt  Chief Complaint: Follow up Holly Bradley 10/25 in IR  Subjective:  Patient laying in bed, asking me to pull her arm to help her move. She reports lower abdominal cramping but no significant pain right now. She is tired and is asking to turn the [facility] on because she is hot. No staff or family at bedside during exam.  Allergies: Patient has no known allergies.  Medications: Prior to Admission medications   Medication Sig Start Date End Date Taking? Authorizing Provider  acetaminophen (TYLENOL) 325 MG tablet Place 2 tablets (650 mg total) into feeding tube every 4 (four) hours as needed for mild pain (or temp > 37.5 C (99.5 F)). Patient taking differently: Take 650 mg by mouth every 4 (four) hours as needed for mild pain (or temp > 37.5 C (99.5 F)). 07/03/21  Yes Samella Parr, NP  Amino Acids-Protein Hydrolys (FEEDING SUPPLEMENT, PRO-STAT SUGAR FREE 64,) LIQD Take 30 mLs by mouth 2 (two) times daily.   Yes [provider]  amiodarone (PACERONE) 200 MG tablet Take 200 mg by mouth daily. 02/02/21  Yes [provider]  apixaban (ELIQUIS) 5 MG TABS tablet Take 5 mg by mouth 2 (two) times daily.   Yes [provider]  aspirin 81 MG chewable tablet Chew 1 tablet (81 mg total) by mouth daily. 07/04/21  Yes Samella Parr, NP  atorvastatin (LIPITOR) 40 MG tablet Take 1 tablet (40 mg total) by mouth daily. 07/04/21  Yes Samella Parr, NP  baclofen (LIORESAL) 20 MG tablet Take 20 mg by mouth 4 (four) times daily.   Yes [provider]  carvedilol (COREG) 25 MG tablet Take 1 tablet (25 mg total) by mouth 2 (two) times daily with a meal. 07/03/21  Yes Samella Parr, NP  cloNIDine (CATAPRES) 0.1 MG tablet Take 1 tablet (0.1 mg total) by mouth 3 (three) times daily. 07/03/21  Yes Samella Parr, NP  ENTRESTO 97-103 MG Take 1 tablet by mouth 2 (two) times  daily. 02/20/21  Yes [provider]  escitalopram (LEXAPRO) 10 MG tablet Take 15 mg by mouth daily.   Yes [provider]  feeding supplement (ENSURE ENLIVE / ENSURE PLUS) LIQD Take 237 mLs by mouth 3 (three) times daily between meals. 07/03/21  Yes Samella Parr, NP  ferrous sulfate 325 (65 FE) MG tablet Take 1 tablet (325 mg total) by mouth 2 (two) times daily with a meal. 02/20/21  Yes Lyda Jester M, PA-C  gabapentin (NEURONTIN) 300 MG capsule Take 300 mg by mouth 3 (three) times daily.   Yes [provider]  hydrALAZINE (APRESOLINE) 100 MG tablet Take 1 tablet (100 mg total) by mouth 3 (three) times daily. 03/02/21  Yes Bensimhon, Shaune Pascal, MD  isosorbide mononitrate (IMDUR) 60 MG 24 hr tablet Take 60 mg by mouth daily. 02/02/21  Yes [provider]  lactulose (CHRONULAC) 10 GM/15ML solution Take 15 mLs (10 g total) by mouth 2 (two) times daily. 07/03/21  Yes Samella Parr, NP  linaclotide (LINZESS) 145 MCG CAPS capsule Take 145 mcg by mouth daily before breakfast.   Yes [provider]  Multiple Vitamin (MULTIVITAMIN WITH MINERALS) TABS tablet Take 1 tablet by mouth daily. 07/04/21  Yes Samella Parr, NP  pantoprazole (PROTONIX) 40 MG tablet Take 40 mg by mouth daily.   Yes [provider]  sacubitril-valsartan (ENTRESTO) [phone]  MG Take 1 tablet by mouth 2 (two) times daily. 02/20/21  Yes Rosita Fire, Brittainy M, PA-C  simethicone (MYLICON) 40 KZ/6.0FU drops Take 0.6 mLs (40 mg total) by mouth every 6 (six) hours as needed for flatulence. 07/03/21  Yes Samella Parr, NP  ARIPiprazole (ABILIFY) 2 MG tablet Take 2 mg by mouth daily. Patient not taking: Reported on 10/10/2021    [provider]  gabapentin (NEURONTIN) 100 MG capsule Take 100 mg by mouth 3 (three) times daily. Patient not taking: Reported on 10/10/2021    [provider]  megestrol (MEGACE) 40 MG tablet Take 40 mg by mouth 2 (two) times daily. Patient not  taking: Reported on 10/10/2021    [provider]  oxyCODONE HCl 7.5 MG TABS Take 7.5 mg by mouth every 4 (four) hours as needed. Patient not taking: Reported on 10/10/2021 07/03/21   Samella Parr, NP  torsemide (DEMADEX) 20 MG tablet TAKE 2 TABLETS (40 MG TOTAL) BY MOUTH 2 (TWO) TIMES DAILY. Patient not taking: Reported on 10/10/2021 03/02/21 03/02/22  Bensimhon, Shaune Pascal, MD  traMADol (ULTRAM) 50 MG tablet Take by mouth every 6 (six) hours as needed. Patient not taking: Reported on 10/10/2021    [provider]     Vital Signs: BP (!) 166/64 (BP Location: Right Arm)   Pulse 68   Temp 98.7 F (37.1 C) (Oral)   Resp 16   Ht 5\' 5"  (1.651 m)   Wt 126 lb 12.2 oz (57.5 kg)   LMP  (LMP Unknown)   SpO2 98%   BMI 21.09 kg/m   Physical Exam Vitals reviewed.  HENT:     Head: Normocephalic.  Cardiovascular:     Rate and Rhythm: Normal rate.     Comments: Right CFA puncture site clean, dry, dressed appropriately, soft, non tender, non pulsatile, no bleeding or drainage. Pulmonary:     Effort: Pulmonary effort is normal.  Abdominal:     Tenderness: There is abdominal tenderness (mild - lower abdomen/pelvis area).  Skin:    General: Skin is warm and dry.  Neurological:     Mental Status: She is alert. Mental status is at baseline.    Imaging: IR Aortagram Abdominal Serialogram  Result Date: 10/16/2021 INDICATION: 45 year old female with hypertrophic multi fibroid uterus with resultant menorrhagia and symptomatic anemia. She requires Eliquis due to recent prior stroke with left-sided hemiplegia and DVT. She presents for uterine artery embolization. Additionally, due to the size of the uterus, there is presumed parasitized flow from the ovarian arteries as well. This will be assessed at the time of angiography. EXAM: IR EMBO ARTERIAL NOT HEMORR HEMANG INC GUIDE ROADMAPPING; IR ULTRASOUND GUIDANCE VASC ACCESS RIGHT; AORTOGRAPHY MEDICATIONS: 2 g Rocephin, 20 mg  hydralazine, 60 mg Toradol (IM). The antibiotic was administered within 1 hour of the procedure ANESTHESIA/SEDATION: Moderate (conscious) sedation was employed during this procedure. A total of Versed 3 mg and Fentanyl 150 mcg was administered intravenously. Moderate Sedation Time: 111 minutes. The patient's level of consciousness and vital signs were monitored continuously by radiology nursing throughout the procedure under my direct supervision. CONTRAST:  72mL OMNIPAQUE IOHEXOL 300 MG/ML  SOLN FLUOROSCOPY TIME:  Fluoroscopy Time: 37 minutes 24 seconds (566 mGy). COMPLICATIONS: None immediate. PROCEDURE: Informed consent was obtained from the patient following explanation of the procedure, risks, benefits and alternatives. The patient understands, agrees and consents for the procedure. All questions were addressed. A time out was performed prior to the initiation of the procedure. Maximal barrier  sterile technique utilized including caps, mask, sterile gowns, sterile gloves, large sterile drape, hand hygiene, and Betadine prep. The right common femoral artery was interrogated with ultrasound and found to be widely patent. An image was obtained and stored for the permanent electronic medical record. Local anesthesia was attained by infiltration with 1% lidocaine. A small dermatotomy was made. Under real-time sonographic guidance, the vessel was punctured with a 21 gauge micropuncture needle. Using standard technique, the initial micro needle was exchanged over a 0.018 micro wire for a transitional 4 [place] micro sheath. The micro sheath was then exchanged over a 0.035 wire for a 5 French vascular sheath. A C2 cobra catheter was advanced up in over the aortic bifurcation and into the left internal iliac artery. Arteriography was performed demonstrating a markedly hypertrophic uterine artery. A renegade hi Flo microcatheter was advanced into the uterine artery and further into the horizontal segment of the uterine  artery. Additional arteriography was performed confirming catheter tip location. There is preferential flow into the uterus and associated fibroids. No vesico vaginal or other proximal branches identified. Particle embolization was then performed using 1 vial of 500-700 micron embospheres and 4 vials of 700-900 micron embospheres. Embolization was taken to near stasis. Post embolization arteriography demonstrates significant pruning of the vessels and decreased flow into the uterus. The microcatheter was removed. The Cobra catheter was formed into a wall men's loop and the catheter brought back into the ipsilateral right internal iliac artery. Contrast injection was performed confirming the presence of a large hypertrophic uterine artery. A renegade hi Flo microcatheter was successfully advanced into the right uterine artery. Arteriography was performed confirming catheter tip location and preferential flow into the uterus. No proximal branches identified. Particle embolization was then performed again using 1 vial of 500-700 micron embospheres and 5 vials of 700-900 micron embospheres. Embolization was taken to near stasis. The microcatheter was removed. Contrast injection was performed following embolization demonstrating decreased antegrade flow into the uterus. A Sos Omni flush catheter was then advanced over a Bentson wire into the abdominal aorta. A flush aortogram was performed. The hypertrophic left ovarian artery was identified. The right ovarian artery is difficult to visualize. The Omni flush catheter was exchanged over the Bentson wire for a Mickelson catheter. The Mickelson catheter was used to successfully catheterize the left ovarian artery. Contrast injection was performed confirming a hypertrophic left ovarian artery with collateralized flow into the uterus. The renegade hi Flo microcatheter was advanced several cm into the left ovarian artery. Particle embolization was then performed using 1 vial  500-700 micron embospheres and 0.5 vials of 700-900 micron embospheres. Post embolization arteriography demonstrates adequately decreased flow into the uterus. The catheters were removed. Hemostasis was attained with the assistance of a Celt arterial closure device. IMPRESSION: Successful bilateral uterine artery and left ovarian artery embolization for treatment of symptomatic uterine fibroids. Electronically Signed   By: Jacqulynn Cadet M.D.   On: 10/16/2021 14:52   IR US Guide Vasc Access Right  Result Date: 10/16/2021 INDICATION: 45 year old female with hypertrophic multi fibroid uterus with resultant menorrhagia and symptomatic anemia. She requires Eliquis due to recent prior stroke with left-sided hemiplegia and DVT. She presents for uterine artery embolization. Additionally, due to the size of the uterus, there is presumed parasitized flow from the ovarian arteries as well. This will be assessed at the time of angiography. EXAM: IR EMBO ARTERIAL NOT HEMORR HEMANG INC GUIDE ROADMAPPING; IR ULTRASOUND GUIDANCE VASC ACCESS RIGHT; AORTOGRAPHY MEDICATIONS: 2 g Rocephin, 20  mg hydralazine, 60 mg Toradol (IM). The antibiotic was administered within 1 hour of the procedure ANESTHESIA/SEDATION: Moderate (conscious) sedation was employed during this procedure. A total of Versed 3 mg and Fentanyl 150 mcg was administered intravenously. Moderate Sedation Time: 111 minutes. The patient's level of consciousness and vital signs were monitored continuously by radiology nursing throughout the procedure under my direct supervision. CONTRAST:  62mL OMNIPAQUE IOHEXOL 300 MG/ML  SOLN FLUOROSCOPY TIME:  Fluoroscopy Time: 37 minutes 24 seconds (566 mGy). COMPLICATIONS: None immediate. PROCEDURE: Informed consent was obtained from the patient following explanation of the procedure, risks, benefits and alternatives. The patient understands, agrees and consents for the procedure. All questions were addressed. A time out was  performed prior to the initiation of the procedure. Maximal barrier sterile technique utilized including caps, mask, sterile gowns, sterile gloves, large sterile drape, hand hygiene, and Betadine prep. The right common femoral artery was interrogated with ultrasound and found to be widely patent. An image was obtained and stored for the permanent electronic medical record. Local anesthesia was attained by infiltration with 1% lidocaine. A small dermatotomy was made. Under real-time sonographic guidance, the vessel was punctured with a 21 gauge micropuncture needle. Using standard technique, the initial micro needle was exchanged over a 0.018 micro wire for a transitional 4 [place] micro sheath. The micro sheath was then exchanged over a 0.035 wire for a 5 French vascular sheath. A C2 cobra catheter was advanced up in over the aortic bifurcation and into the left internal iliac artery. Arteriography was performed demonstrating a markedly hypertrophic uterine artery. A renegade hi Flo microcatheter was advanced into the uterine artery and further into the horizontal segment of the uterine artery. Additional arteriography was performed confirming catheter tip location. There is preferential flow into the uterus and associated fibroids. No vesico vaginal or other proximal branches identified. Particle embolization was then performed using 1 vial of 500-700 micron embospheres and 4 vials of 700-900 micron embospheres. Embolization was taken to near stasis. Post embolization arteriography demonstrates significant pruning of the vessels and decreased flow into the uterus. The microcatheter was removed. The Cobra catheter was formed into a wall men's loop and the catheter brought back into the ipsilateral right internal iliac artery. Contrast injection was performed confirming the presence of a large hypertrophic uterine artery. A renegade hi Flo microcatheter was successfully advanced into the right uterine artery.  Arteriography was performed confirming catheter tip location and preferential flow into the uterus. No proximal branches identified. Particle embolization was then performed again using 1 vial of 500-700 micron embospheres and 5 vials of 700-900 micron embospheres. Embolization was taken to near stasis. The microcatheter was removed. Contrast injection was performed following embolization demonstrating decreased antegrade flow into the uterus. A Sos Omni flush catheter was then advanced over a Bentson wire into the abdominal aorta. A flush aortogram was performed. The hypertrophic left ovarian artery was identified. The right ovarian artery is difficult to visualize. The Omni flush catheter was exchanged over the Bentson wire for a Mickelson catheter. The Mickelson catheter was used to successfully catheterize the left ovarian artery. Contrast injection was performed confirming a hypertrophic left ovarian artery with collateralized flow into the uterus. The renegade hi Flo microcatheter was advanced several cm into the left ovarian artery. Particle embolization was then performed using 1 vial 500-700 micron embospheres and 0.5 vials of 700-900 micron embospheres. Post embolization arteriography demonstrates adequately decreased flow into the uterus. The catheters were removed. Hemostasis was attained with the assistance  of a Celt arterial closure device. IMPRESSION: Successful bilateral uterine artery and left ovarian artery embolization for treatment of symptomatic uterine fibroids. Electronically Signed   By: Jacqulynn Cadet M.D.   On: 10/16/2021 14:52   IR EMBO ARTERIAL NOT HEMORR HEMANG INC GUIDE ROADMAPPING  Result Date: 10/16/2021 INDICATION: 45 year old female with hypertrophic multi fibroid uterus with resultant menorrhagia and symptomatic anemia. She requires Eliquis due to recent prior stroke with left-sided hemiplegia and DVT. She presents for uterine artery embolization. Additionally, due to the  size of the uterus, there is presumed parasitized flow from the ovarian arteries as well. This will be assessed at the time of angiography. EXAM: IR EMBO ARTERIAL NOT HEMORR HEMANG INC GUIDE ROADMAPPING; IR ULTRASOUND GUIDANCE VASC ACCESS RIGHT; AORTOGRAPHY MEDICATIONS: 2 g Rocephin, 20 mg hydralazine, 60 mg Toradol (IM). The antibiotic was administered within 1 hour of the procedure ANESTHESIA/SEDATION: Moderate (conscious) sedation was employed during this procedure. A total of Versed 3 mg and Fentanyl 150 mcg was administered intravenously. Moderate Sedation Time: 111 minutes. The patient's level of consciousness and vital signs were monitored continuously by radiology nursing throughout the procedure under my direct supervision. CONTRAST:  16mL OMNIPAQUE IOHEXOL 300 MG/ML  SOLN FLUOROSCOPY TIME:  Fluoroscopy Time: 37 minutes 24 seconds (566 mGy). COMPLICATIONS: None immediate. PROCEDURE: Informed consent was obtained from the patient following explanation of the procedure, risks, benefits and alternatives. The patient understands, agrees and consents for the procedure. All questions were addressed. A time out was performed prior to the initiation of the procedure. Maximal barrier sterile technique utilized including caps, mask, sterile gowns, sterile gloves, large sterile drape, hand hygiene, and Betadine prep. The right common femoral artery was interrogated with ultrasound and found to be widely patent. An image was obtained and stored for the permanent electronic medical record. Local anesthesia was attained by infiltration with 1% lidocaine. A small dermatotomy was made. Under real-time sonographic guidance, the vessel was punctured with a 21 gauge micropuncture needle. Using standard technique, the initial micro needle was exchanged over a 0.018 micro wire for a transitional 4 [place] micro sheath. The micro sheath was then exchanged over a 0.035 wire for a 5 French vascular sheath. A C2 cobra catheter was  advanced up in over the aortic bifurcation and into the left internal iliac artery. Arteriography was performed demonstrating a markedly hypertrophic uterine artery. A renegade hi Flo microcatheter was advanced into the uterine artery and further into the horizontal segment of the uterine artery. Additional arteriography was performed confirming catheter tip location. There is preferential flow into the uterus and associated fibroids. No vesico vaginal or other proximal branches identified. Particle embolization was then performed using 1 vial of 500-700 micron embospheres and 4 vials of 700-900 micron embospheres. Embolization was taken to near stasis. Post embolization arteriography demonstrates significant pruning of the vessels and decreased flow into the uterus. The microcatheter was removed. The Cobra catheter was formed into a wall men's loop and the catheter brought back into the ipsilateral right internal iliac artery. Contrast injection was performed confirming the presence of a large hypertrophic uterine artery. A renegade hi Flo microcatheter was successfully advanced into the right uterine artery. Arteriography was performed confirming catheter tip location and preferential flow into the uterus. No proximal branches identified. Particle embolization was then performed again using 1 vial of 500-700 micron embospheres and 5 vials of 700-900 micron embospheres. Embolization was taken to near stasis. The microcatheter was removed. Contrast injection was performed following embolization demonstrating decreased antegrade  flow into the uterus. A Sos Omni flush catheter was then advanced over a Bentson wire into the abdominal aorta. A flush aortogram was performed. The hypertrophic left ovarian artery was identified. The right ovarian artery is difficult to visualize. The Omni flush catheter was exchanged over the Bentson wire for a Mickelson catheter. The Mickelson catheter was used to successfully catheterize  the left ovarian artery. Contrast injection was performed confirming a hypertrophic left ovarian artery with collateralized flow into the uterus. The renegade hi Flo microcatheter was advanced several cm into the left ovarian artery. Particle embolization was then performed using 1 vial 500-700 micron embospheres and 0.5 vials of 700-900 micron embospheres. Post embolization arteriography demonstrates adequately decreased flow into the uterus. The catheters were removed. Hemostasis was attained with the assistance of a Celt arterial closure device. IMPRESSION: Successful bilateral uterine artery and left ovarian artery embolization for treatment of symptomatic uterine fibroids. Electronically Signed   By: Jacqulynn Cadet M.D.   On: 10/16/2021 14:52    Labs:  CBC: Recent Labs    10/15/21 1257 10/16/21 0135 10/16/21 1829 10/17/21 0156  WBC 5.5 5.4 8.7 7.9  HGB 8.7* 7.8* 9.0* 7.8*  HCT 29.3* 25.3* 29.9* 25.9*  PLT 269 271 283 274    COAGS: Recent Labs    01/10/21 2125 10/09/21 2300 10/10/21 0018 10/10/21 1244 10/16/21 0135  INR 1.3* 1.7* 1.7* 1.3* 1.2  APTT 25 46* 46* 35  --     BMP: Recent Labs    10/13/21 0715 10/14/21 0853 10/16/21 0135 10/17/21 0156  NA 137 137 136 134*  K 4.0 4.1 3.5 4.5  CL 112* 110 108 109  CO2 17* 20* 21* 19*  GLUCOSE 81 84 108* 95  BUN 18 15 12 13   CALCIUM 9.8 9.8 9.7 9.3  CREATININE 0.60 0.54 0.66 0.78  GFRNONAA >60 >60 >60 >60    LIVER FUNCTION TESTS: Recent Labs    10/11/21 0229 10/12/21 0247 10/13/21 0715 10/14/21 0853  BILITOT 1.0 0.8 0.7 0.8  AST 11* 25 18 17   ALT 8 11 11 11   ALKPHOS 90 105 97 108  PROT 6.1* 7.0 6.5 6.6  ALBUMIN 2.9* 3.0* 2.9* 3.0*    Assessment and Plan:  45 y/o F with history of menorrhagia and subsequent anemia s/p Holly Bradley yesterday in IR seen today for follow up.  Patient denies any significant abdominal pain but does report lower abdominal cramping as expected, currently using PCA pump for pain  management - discussed transitioning to PO pain medications which she is agreeable to. Right CFA puncture site soft, non tender, no active bleeding/drainage.  Plan: - D/c PCA, transition to PO narcotics as well as scheduled anti-inflammatory medications if patient able to tolerate - Routine wound care of right CFA puncture site until healed, do not submerge x 7 days - 1 month outpatient IR follow up with Dr. Laurence Ferrari (order placed, contact info in AVS, scheduler will call patient to schedule)  IR remains available as needed, please call with questions or concerns.   Electronically Signed: Joaquim Nam, PA-C 10/17/2021, 10:05 AM   I spent a total of 15 Minutes at the the patient's bedside AND on the patient's hospital floor or unit, greater than 50% of which was counseling/coordinating care for Holly Bradley follow up.

## 2021-10-17 NOTE — Hospital Course (Addendum)
45 y.o. 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 Hg stable at  ~9 off of Eliquis, with no signs of active bleeding. Consult placed for Neurology/Stroke team to assess risk/benefits to anticoagulation. Hematology consulted at [facility], repeat hypercoag panel was pending. She is transferred to [facility] for IR uterine artery embolization 10/25 as not an appropriate candidate for hysterectomy because of multiple comorbidity. LLE DVT, likely 2/2 compressive mass effect of large uterus. There was initially a concern for sepsis development, started on broad spectrum Abx, blood cx negative, and empiric abx discontinued.   Acute blood loss anemia 2/2 continuous menorragia from uterine fibroids, resolved Hg stable s/p IR [place] 10/25. Off of Megace; no signs of profuse bleeding, only small quantity of blood drained from [place]. Pt asymptomatic, vitals stable, and pain appropriately controlled. Leukocytosis up to mid 20's 4 day after [place] consistent with postembolization syndrome as continued to trend down, with no source of infection. UA clean with no growth on urine Cx, CXR negative, CT abd/pelvis without concerns for infection, and BC NG at 3 days. Withheld from starting empiric abx given no source and clinical improvement, but will start if she clinically deteriorates. - STOPPED Megace 40 bid and CTM for blood loss - Given stable Hg, start Eliquis x 3 months in setting of provoked left LE DVT and then ASA alone.  - CBC and transfuse if Hg < 7 - Pain management: scheduled tylenol, norco, toradol, PCA Dc'd - Routine wound care of right CFA puncture site until healed - 1 month  outpatient IR follow up with Dr. Laurence Ferrari   Prior Hx of CVA with residual left sided hemiplegia Possible Lupus anticoagulant  LLE DVT Lupus Anticoagulant neg on confirmatory test. Given stable Hgb, resumed Eliquis in setting of provoked LE DVT.   - RESUMED Eliquis x 3 months, then ASA 81 mg qd indefinitely  - Atorvastatin 40 qd - Follow up with Dr Leonie Man in stroke clinic - Palliative consulted at [facility] for [place]   HTN SBP up to 190's, but improved to 140's prior to d/c. - titrated BP meds as tolerated with goal of normotension. Avoid overcorrection of BP given stroke hx.  - Entresto bid  - Clonidine 0.1 tid  - Hydralazine 100 tid  - Imdur 60 qd - Coreg 25 bid   Hx of CHF Tolerated 5 U pRBC's transfusion without signs of fluid overload and euvolemic on exam.     Hx of SVT on amiodorone  Hemodynamically stable - Continue amiodarone

## 2021-10-17 NOTE — Progress Notes (Signed)
Subjective:  No acute overnight events.   Pt states that she is in pain.  The pain is mostly involving involving her back and has some tenderness to palpation of the abdomen. She has not necessarily noticed any bleeding. She denies chest pain and SHOB.    No other complaints or concerns at this time.    Pt is updated on the plan for today, and all questions are addressed.    Objective:   Vital signs in last 24 hours:       Vitals:    10/14/21 2059 10/15/21 0010 10/15/21 0409 10/15/21 0928  BP: 114/63 (!) 103/55 (!) 149/74 (!) 155/70  Pulse: 77 75 72 66  Resp: 18 16 18 18   Temp: 98.4 F (36.9 C) 98.5 F (36.9 C) (!) 97.5 F (36.4 C) 97.8 F (36.6 C)  TempSrc: Oral Oral Axillary Oral  SpO2: 100% 100% 100% 100%  Weight:          Height:            Constitutional: alert, well-appearing, in NAD HENT: normocephalic, atraumatic, mucous membranes moist Eyes: conjunctiva non-erythematous, EOMI Cardiovascular: RRR, no m/r/g; trace pitting edema of the left lower extremity, non-edematous right lower extremity.  Pulmonary/Chest: normal work of breathing on room air, LCTAB Abdominal: soft, mildly TTP, distended abdomen  MSK: normal bulk and tone Neurological: A&O x 3, left-sided hemiplegia with left arm contracted Skin: warm and dry Psych: anxious, normal affect   Assessment/Plan:   Principal Problem:   Acute blood loss anemia Active Problems:   Menorrhagia   Uncontrolled hypertension   Hemorrhagic stroke (HCC)   Uterine fibroid   Left hemiparesis (HCC)   Lupus anticoagulant positive   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)   Pressure injury of skin   45 y.o. 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  Acute blood loss anemia 2/2 continuous menorragia from uterine fibroids, resolved Hg stable s/p [place] 10/25; no signs of profuse bleeding, no blood colored fluids drained from [place]. Hemodynamically and vitals stable; complains of significant back pain, advised pt to request for her prn pain medications as needed.  - HOLD Megace 40 bid and CTM for blood loss - Given stable Hg, start Eliquis x 3 months in setting of provoked left LE DVT and then ASA alone.  - CTM CBC and transfuse if Hg < 7 - Pain management: scheduled tylenol, norco, toradol, PCA Dc'd - Routine wound care of right CFA puncture site until healed - 1 month outpatient IR follow up with Dr. Laurence Ferrari   Prior Hx of CVA with residual left sided hemiplegia LLE DVT Lupus Anticoagulant neg on confirmatory test. Given stable Hgb, will resume Eliquis in setting of provoked LE DVT.   - RESUME Eliquis x 3 months, then ASA 81 mg qd indefinitely  - Atorvastatin 40 qd - Follow up with Dr Leonie Man in stroke clinic   HTN SBP improved 140-150's. Will CTM and adjust medications if necessary.  - titrate BP meds as tolerated with goal of normotension. Avoid overcorrection of BP given stroke hx.  - Entresto bid  - Clonidine 0.1 tid  -  Hydralazine 100 tid  - Imdur 60 qd - Coreg 25 bid   Hx of CHF Remains free of signs of fluid overload and euvolemic on exam.     Hx of SVT on amiodorone  Hemodynamically stable - Continue amiodarone      Best Practice: Diet: Dys 3 diet  IVF: None,None VTE: none  Code: Partial code    Lajean Manes, MD 10/15/2021, 11:37 AM Pager: [phone] After 5pm on weekdays and 1pm on weekends: On Call pager [phone]

## 2021-10-17 NOTE — Progress Notes (Signed)
Physical Therapy Treatment Patient Details Name: Holly Bradley MRN: [id number] DOB: 1976/05/20 Today's Date: 10/17/2021   History of Present Illness 45 y.o. female with history of CVA with hx of fibroids, left-sided hemiplegia, SVT, CHF, chronic anemia, hypertension was brought to the ER after patient's hemoglobin was found to be low (hgb 3.0) and patient also has been having continuous menorrhagia.    PT Comments    Pt admitted with above diagnosis. PT session limited as pt did not want to sit EOB.  PT performed some P/ROM and AA/ROM with pt.  Will continue to follow acutely.  Pt currently with functional limitations due to the deficits listed below (see PT Problem List). Pt will benefit from skilled PT to increase their independence and safety with mobility to allow discharge to the venue listed below.      Recommendations for follow up therapy are one component of a multi-disciplinary discharge planning process, led by the attending physician.  Recommendations may be updated based on patient status, additional functional criteria and insurance authorization.  Follow Up Recommendations  Skilled nursing-short term rehab (<3 hours/day)     Assistance Recommended at Discharge Frequent or constant Supervision/Assistance  Equipment Recommendations  None recommended by PT    Recommendations for Other Services       Precautions / Restrictions Precautions Precautions: Fall Precaution Comments: left hemiplegia, left shoulder subluxation, lifted to w/c at SNF Restrictions Weight Bearing Restrictions: No     Mobility  Bed Mobility               General bed mobility comments: declines to sit EOB but agrees to do so next session    Transfers                        Ambulation/Gait                 Stairs             Wheelchair Mobility    Modified Rankin (Stroke Patients Only)       Balance                                             Cognition Arousal/Alertness: Awake/alert Behavior During Therapy: Flat affect Overall Cognitive Status: Within Functional Limits for tasks assessed                                          Exercises General Exercises - Upper Extremity Shoulder Flexion: PROM;Left Elbow Flexion: PROM;Left General Exercises - Lower Extremity Ankle Circles/Pumps: PROM;Left (AROM right) Quad Sets: AROM;Right Heel Slides: AROM;Right (PROM left)    General Comments        Pertinent Vitals/Pain Pain Assessment: Faces Faces Pain Scale: Hurts little more Pain Location: everywhere but mostly Left UE and legs especially with movement Pain Descriptors / Indicators: Grimacing Pain Intervention(s): Limited activity within patient's tolerance;Monitored during session;Repositioned    Home Living                          Prior Function            PT Goals (current goals can now be found in the care plan section) Progress towards PT goals: Not progressing toward  goals - comment (declines to sit EOB)    Frequency    Min 2X/week      PT Plan Current plan remains appropriate    Co-evaluation              AM-PAC PT "6 Clicks" Mobility   Outcome Measure  Help needed turning from your back to your side while in a flat bed without using bedrails?: Total Help needed moving from lying on your back to sitting on the side of a flat bed without using bedrails?: Total Help needed moving to and from a bed to a chair (including a wheelchair)?: Total Help needed standing up from a chair using your arms (e.g., wheelchair or bedside chair)?: Total Help needed to walk in hospital room?: Total Help needed climbing 3-5 steps with a railing? : Total 6 Click Score: 6    End of Session   Activity Tolerance: Patient limited by fatigue Patient left: in bed;with call bell/phone within reach Nurse Communication: Mobility status;Need for lift equipment PT Visit Diagnosis:  Muscle weakness (generalized) (M62.81);Hemiplegia and hemiparesis Hemiplegia - Right/Left: Left Hemiplegia - dominant/non-dominant: Non-dominant     Time:  -     Charges:    Exercise                  Holly Bradley M,PT Acute Rehab Services [phone] (pager)   Holly Bradley 10/17/2021, 6:51 PM

## 2021-10-17 NOTE — Discharge Instructions (Addendum)
Dear Holly Bradley,  Thank you for allowing [place] to take care of you during your hospitalization. You were admitted to the hospital because of a lot bleeding causing your blood levels to be very low. We did several labs and tests to rule out many life threatening things, as well as to determine the cause of your symptoms.  We treated you by giving you several blood transfusions and doing a uterine artery embolization to control future bleeding.  In addition, you had a clot in your vein in your left leg.  We have put in a filter to prevent this clot from entering your lungs.  You are improved significantly with this treatment, and we continued to monitor you until you were stable enough for discharge.   PLEASE do not miss any doses of your medications as it is very important in ensuring you continue to feel better and remain stable.   You need to follow up with your primary care doctor by calling their office within the next week to create an appointment to discuss the events of this hospitalization, and to determine the best management plan for you and your conditions, to prevent you from having to return to the hospital. ADDITIONALLY, please go to any other appointments made for you in this discharge information, including with Dr Leonie Man in the stroke clinic.   RETURN to the ED if you have similar or worsening symptoms, and do not feel like your normal self.

## 2021-10-17 NOTE — Progress Notes (Signed)
Speech Language Pathology Treatment:    Patient Details Name: Holly Bradley MRN: [id number] DOB: [birth year] Today's Date: 10/17/2021 Time:  -     Pt was seen for bedside swallow last week and per ST note pt has "self selected to stay on nectar thick liquids due to choking with thin liquids approx 75% of the time, even with a bolus flow control cup". SLP will discharge from [place] and if pt wishes to upgrade, please reconsult       [name]  10/17/2021, 3:37 PM

## 2021-10-18 DIAGNOSIS — D62 Acute posthemorrhagic anemia: Secondary | ICD-10-CM | POA: Diagnosis not present

## 2021-10-18 LAB — BASIC METABOLIC PANEL
Anion gap: 7 (ref 5–15)
BUN: 16 mg/dL (ref 6–20)
CO2: 17 mmol/L — ABNORMAL LOW (ref 22–32)
Calcium: 9.3 mg/dL (ref 8.9–10.3)
Chloride: 109 mmol/L (ref 98–111)
Creatinine, Ser: 0.84 mg/dL (ref 0.44–1.00)
GFR, Estimated: >60 mL/min (ref >60)
Glucose, Bld: 89 mg/dL (ref 70–99)
Potassium: 4.5 mmol/L (ref 3.5–5.1)
Sodium: 133 mmol/L — ABNORMAL LOW (ref 135–145)

## 2021-10-18 LAB — CBC
HCT: 25.4 % — ABNORMAL LOW (ref 36.0–46.0)
Hemoglobin: 7.7 g/dL — ABNORMAL LOW (ref 12.0–15.0)
MCH: 25.7 pg — ABNORMAL LOW (ref 26.0–34.0)
MCHC: 30.3 g/dL (ref 30.0–36.0)
MCV: 84.7 fL (ref 80.0–100.0)
Platelets: 262 10*3/uL (ref 150–400)
RBC: 3 MIL/uL — ABNORMAL LOW (ref 3.87–5.11)
RDW: 20.2 % — ABNORMAL HIGH (ref 11.5–15.5)
WBC: 11.1 10*3/uL — ABNORMAL HIGH (ref 4.0–10.5)
nRBC: 0 % (ref 0.0–0.2)

## 2021-10-18 NOTE — Progress Notes (Signed)
Physical Therapy Treatment Patient Details Name: Holly Bradley MRN: [id number] DOB: 1976/08/22 Today's Date: 10/18/2021   History of Present Illness 44 y.o. female with history of CVA with hx of fibroids, left-sided hemiplegia, SVT, CHF, chronic anemia, hypertension was brought to the ER after patient's hemoglobin was found to be low (hgb 3.0) and patient also has been having continuous menorrhagia.    PT Comments    Pt admitted with above diagnosis. Pt was able to sit EOB 4 min  needing max assist of 2.  Pt c/o left LE pain and internally distracted due to this.  She did attempt to sit EOB without assist but loses balance posterior and to left. Pt needs max encouragement to work with therapy as well even though she will tell her her goal is to walk.  Will continue PT.   Pt currently with functional limitations due to balance and enduance deficits. Pt will benefit from skilled PT to increase their independence and safety with mobility to allow discharge to the venue listed below.      Recommendations for follow up therapy are one component of a multi-disciplinary discharge planning process, led by the attending physician.  Recommendations may be updated based on patient status, additional functional criteria and insurance authorization.  Follow Up Recommendations  Skilled nursing-short term rehab (<3 hours/day)     Assistance Recommended at Discharge Frequent or constant Supervision/Assistance  Equipment Recommendations  None recommended by PT    Recommendations for Other Services       Precautions / Restrictions Precautions Precautions: Fall Precaution Comments: left hemiplegia, left shoulder subluxation, lifted to w/c at SNF Restrictions Weight Bearing Restrictions: No     Mobility  Bed Mobility Overal bed mobility: Needs Assistance Bed Mobility: Supine to Sit     Supine to sit: +2 for physical assistance;Total assist;HOB elevated     General bed mobility comments: Pt  initially declined to sit EOB but reminded pt she agreed to try yesterday therefore pt agreed.  Pt needed incr assist with PT moving Les and assist for elevation of trunk.    Transfers                        Ambulation/Gait                 Stairs             Wheelchair Mobility    Modified Rankin (Stroke Patients Only)       Balance Overall balance assessment: Needs assistance Sitting-balance support: Single extremity supported;Feet supported Sitting balance-Leahy Scale: Zero Sitting balance - Comments: Pt requires max assist to sit EOB progressing to mod assist at times. She does have a little trunk control but cannot sustain for more than seconds at a time and doesnt use her right UE to help support even though she does attempt to hold onto rail.  Pt c/o buttock pain and was only able to sit up for about 4 min.  She did try to kick her LEs to command and was able to move the right. She c/o incr pain in left LE with sitting as well. Postural control: Posterior lean;Left lateral lean                                  Cognition Arousal/Alertness: Awake/alert Behavior During Therapy: Flat affect Overall Cognitive Status: Within Functional Limits for tasks assessed  Exercises General Exercises - Upper Extremity Shoulder Flexion: PROM;Left Elbow Flexion: PROM;Left General Exercises - Lower Extremity Ankle Circles/Pumps: PROM;Left (AROM right) Quad Sets: AROM;Right Heel Slides: AROM;Right (PROM left)    General Comments General comments (skin integrity, edema, etc.): Pts lunch came and PT stayed with her and documented a few min so that she could work on feeding herself.      Pertinent Vitals/Pain Pain Assessment: Faces Faces Pain Scale: Hurts even more Pain Location: everywhere but mostly Left UE and legs especially with movement Pain Descriptors / Indicators: Grimacing Pain  Intervention(s): Limited activity within patient's tolerance;Monitored during session;Repositioned    Home Living                          Prior Function            PT Goals (current goals can now be found in the care plan section) Progress towards PT goals: Progressing toward goals    Frequency    Min 2X/week      PT Plan Current plan remains appropriate    Co-evaluation              AM-PAC PT "6 Clicks" Mobility   Outcome Measure  Help needed turning from your back to your side while in a flat bed without using bedrails?: Total Help needed moving from lying on your back to sitting on the side of a flat bed without using bedrails?: Total Help needed moving to and from a bed to a chair (including a wheelchair)?: Total Help needed standing up from a chair using your arms (e.g., wheelchair or bedside chair)?: Total Help needed to walk in hospital room?: Total Help needed climbing 3-5 steps with a railing? : Total 6 Click Score: 6    End of Session   Activity Tolerance: Patient limited by fatigue Patient left: in bed;with call bell/phone within reach Nurse Communication: Mobility status;Need for lift equipment PT Visit Diagnosis: Muscle weakness (generalized) (M62.81);Hemiplegia and hemiparesis Hemiplegia - Right/Left: Left Hemiplegia - dominant/non-dominant: Non-dominant     Time: 6378-5885 PT Time Calculation (min) (ACUTE ONLY): 28 min  Charges:  $Therapeutic Exercise: 8-22 mins $Therapeutic Activity: 8-22 mins                     Holly Bradley M,PT Acute Rehab Services [phone] (pager)    Holly Bradley 10/18/2021, 2:23 PM

## 2021-10-18 NOTE — Progress Notes (Addendum)
Subjective:  No acute overnight events.   Pt states that she is in pain. She rates it as an 8/10  and states that it is mostly located in the left groin region.    No other complaints or concerns at this time.    Pt is updated on the plan for today, and all questions are addressed.    Objective:   Vital signs in last 24 hours:       Vitals:    10/14/21 2059 10/15/21 0010 10/15/21 0409 10/15/21 0928  BP: 114/63 (!) 103/55 (!) 149/74 (!) 155/70  Pulse: 77 75 72 66  Resp: 18 16 18 18   Temp: 98.4 F (36.9 C) 98.5 F (36.9 C) (!) 97.5 F (36.4 C) 97.8 F (36.6 C)  TempSrc: Oral Oral Axillary Oral  SpO2: 100% 100% 100% 100%  Weight:          Height:            Constitutional: alert, well-appearing, in NAD HENT: normocephalic, atraumatic, mucous membranes moist Eyes: conjunctiva non-erythematous, EOMI Cardiovascular: RRR, no m/r/g; trace pitting edema of the left lower extremity, non-edematous right lower extremity.  Pulmonary/Chest: normal work of breathing on room air, LCTAB Abdominal: soft, mildly TTP, distended abdomen  MSK: normal bulk and tone, LLE non tender to palpation with no obvious cause for her pain Neurological: A&O x 3, left-sided hemiplegia with left arm contracted Skin: warm and dry Psych: anxious, normal affect   Assessment/Plan:   Principal Problem:   Acute blood loss anemia Active Problems:   Menorrhagia   Uncontrolled hypertension   Hemorrhagic stroke (HCC)   Uterine fibroid   Left hemiparesis (HCC)   Lupus anticoagulant positive   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)   Pressure injury of skin   45 y.o. 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   Acute blood loss anemia 2/2 continuous menorragia from uterine fibroids, resolved Leukocytosis  s/p [place] 10/25; Hg uptrending 7.7. -> 8.7 after starting Eliquis yesterday. No signs of profuse bleeding, and no blood colored fluids drained from [place]. Hemodynamically and vitals stable; complains left groin pain and is TTP, advised pt to request for her prn pain medications as needed.  CBC with leukocytosis 11.1 -> 21.6, but pt is afebrile and without any complaints besides left groin pain; etiology currently unknown, will work up.  - Eliquis x 3 months in setting of provoked left LE DVT and then ASA alone.  - CTM CBC and transfuse if Hg < 7 - Follow up blood cultures x 2 - Follow up UA and urine culture  - Pain management: scheduled tylenol, norco, toradol - Routine wound care of right CFA puncture site until healed, no signs of infection  - 1 month outpatient IR follow up with Dr. Laurence Ferrari   Prior Hx of CVA with residual left sided hemiplegia LLE DVT Lupus Anticoagulant neg on confirmatory test. Resumed Eliquis 10/27 for LE DVT.   - Eliquis x 3 months, then ASA 81 mg qd indefinitely  - Atorvastatin 40 qd - Follow up with Dr Leonie Man in stroke clinic   HTN SBP improved 140-150's.  Will CTM and adjust medications if necessary.  - Avoid overcorrection of BP given stroke hx - Entresto bid  - Clonidine 0.1 tid  - Hydralazine 100 tid  - Imdur 60 qd - Coreg 25 bid   Hx of CHF Remains free of signs of fluid overload and euvolemic on exam.     Hx of SVT on amiodorone  Hemodynamically stable - Continue amiodarone      Best Practice: Diet: Dys 3 diet  IVF: None,None VTE: none  Code: Partial code    Lajean Manes, MD 10/15/2021, 11:37 AM Pager: [phone] After 5pm on weekdays and 1pm on weekends: On Call pager [phone]   Internal Medicine Attending:   I saw and examined the patient. I reviewed the  resident's note and I agree with the resident's findings and plan as documented in the resident's note.

## 2021-10-18 NOTE — Progress Notes (Signed)
Pt has been constant hollering all day. We have reposition her several time throughout the shift. I have given her scheduled meds for pain, see MAR.

## 2021-10-19 ENCOUNTER — Inpatient Hospital Stay (HOSPITAL_COMMUNITY): Payer: Medicaid - Out of State

## 2021-10-19 DIAGNOSIS — D62 Acute posthemorrhagic anemia: Secondary | ICD-10-CM | POA: Diagnosis not present

## 2021-10-19 LAB — CBC
HCT: 29.2 % — ABNORMAL LOW (ref 36.0–46.0)
Hemoglobin: 8.7 g/dL — ABNORMAL LOW (ref 12.0–15.0)
MCH: 25.7 pg — ABNORMAL LOW (ref 26.0–34.0)
MCHC: 29.8 g/dL — ABNORMAL LOW (ref 30.0–36.0)
MCV: 86.4 fL (ref 80.0–100.0)
Platelets: 292 10*3/uL (ref 150–400)
RBC: 3.38 MIL/uL — ABNORMAL LOW (ref 3.87–5.11)
RDW: 20.7 % — ABNORMAL HIGH (ref 11.5–15.5)
WBC: 21.6 10*3/uL — ABNORMAL HIGH (ref 4.0–10.5)
nRBC: 0 % (ref 0.0–0.2)

## 2021-10-19 IMAGING — DX DG CHEST 1V
1 series · 1 of 1 positions shown · non-contrast
Comparison: [DATE]

CLINICAL DATA: Leukocytosis

EXAM:
CHEST  1 VIEW

[chest ap]
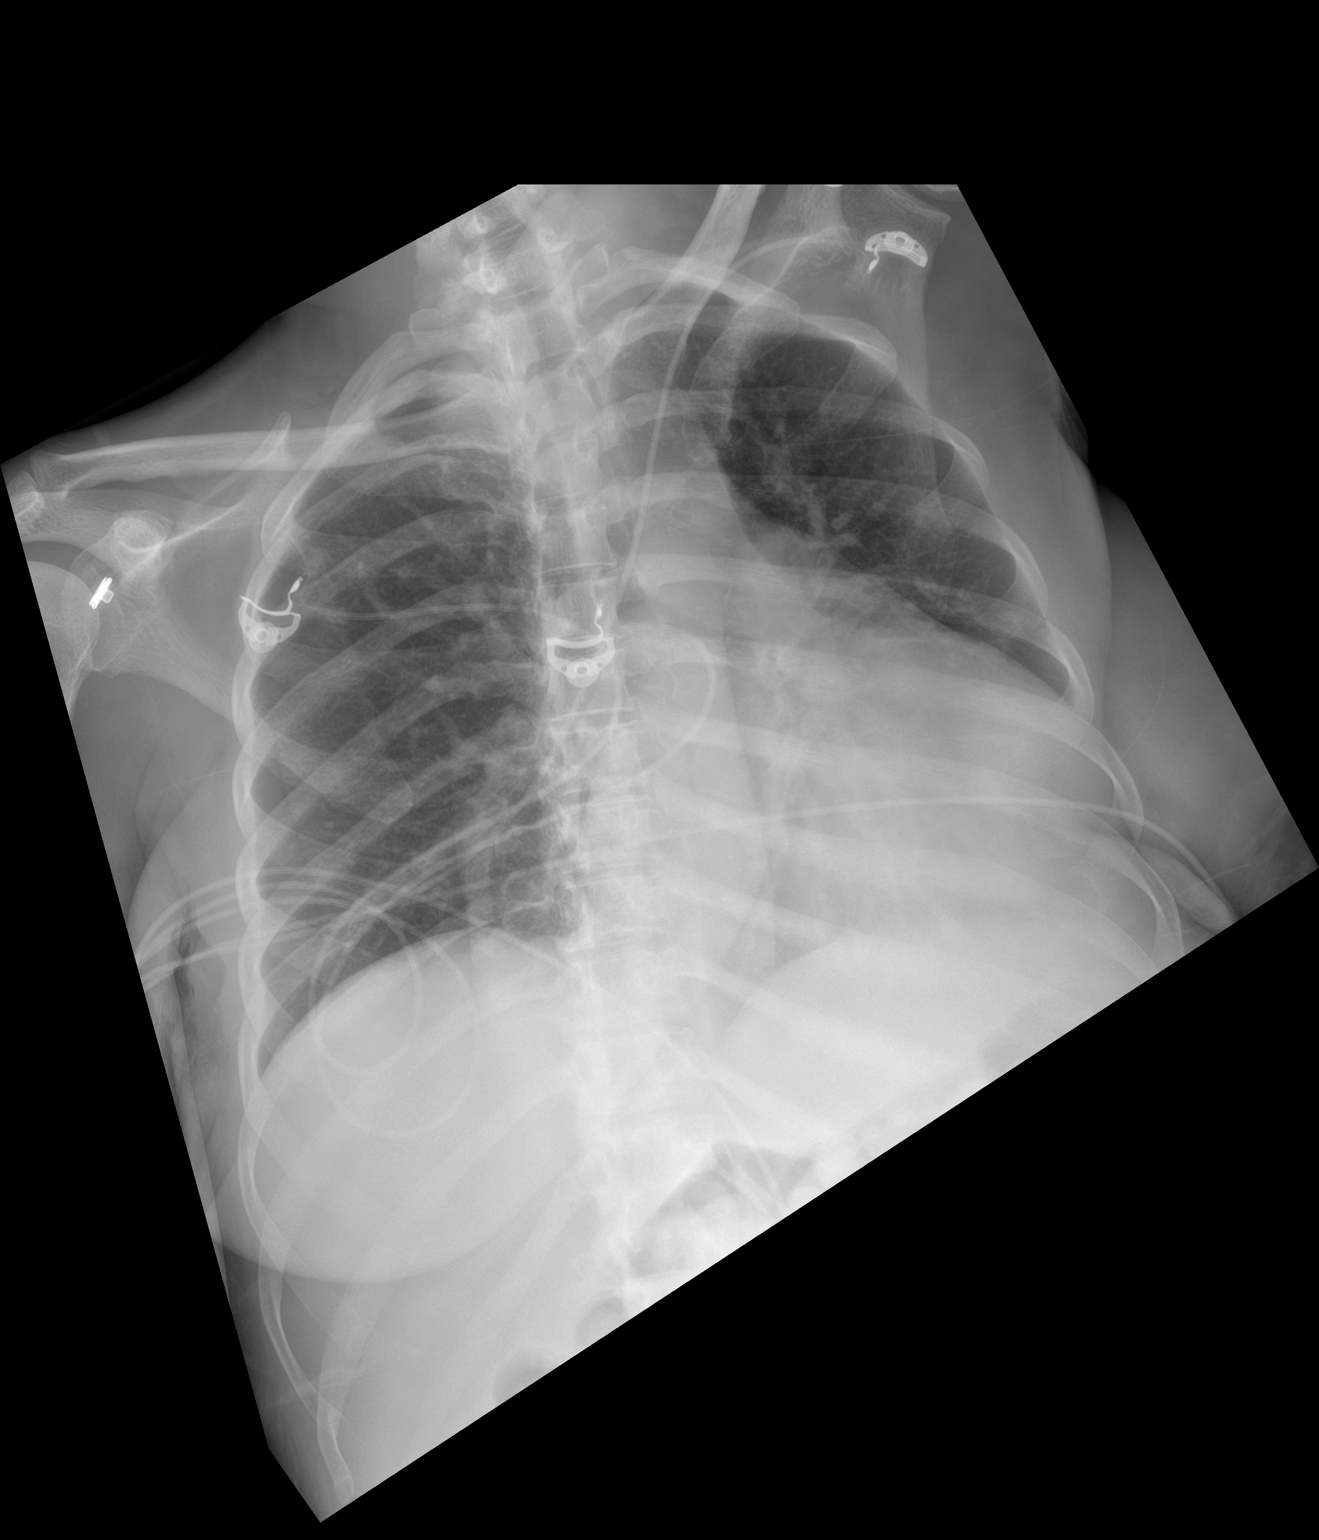

[1 of 1 positions shown; findings below may reference images not displayed]

FINDINGS: Lungs are well expanded, symmetric, and clear. No pneumothorax or
pleural effusion. Cardiac size is mildly enlarged, unchanged.
Pulmonary vascularity is normal. Osseous structures are
age-appropriate. No acute bone abnormality.
IMPRESSION: No active disease.  Stable cardiomegaly.

## 2021-10-19 MED ORDER — HYDROXYZINE HCL 25 MG PO TABS
25.0000 mg | ORAL_TABLET | Freq: Once | ORAL | Status: AC
  Administered 2021-10-19: 25 mg via ORAL
  Filled 2021-10-19: qty 1

## 2021-10-19 NOTE — TOC Initial Note (Signed)
Transition of Care [facility]) - Initial/Assessment Note    Patient Details  Name: Holly Bradley MRN: [id number] Date of Birth: [birth year]  Transition of Care [facility]) CM/SW Contact:    Emeterio Reeve, LCSW Phone Number: 10/19/2021, 3:26 PM  Clinical Narrative:                  Pt is LTC at [facility]. They are able to accept pt back at discharge. Pt is not medically stable today. Pt will need a covid test on Sunday for a possible Monday DC.    Expected Discharge Plan: Skilled Nursing Facility Barriers to Discharge: No Barriers Identified   Patient Goals and CMS Choice Patient states their goals for this hospitalization and ongoing recovery are:: return to [facility].gov Compare Post Acute Care list provided to:: Patient Choice offered to / list presented to : Patient  Expected Discharge Plan and Services Expected Discharge Plan: [place]   Discharge Planning Services: CM Consult   Living arrangements for the past 2 months: [place]                                      Prior Living Arrangements/Services Living arrangements for the past 2 months: [place] Lives with:: Facility Resident Patient language and need for interpreter reviewed:: Yes Do you feel safe going back to the place where you live?: Yes      Need for Family Participation in Patient Care: Yes (Comment) Care giver support system in place?: Yes (comment)   Criminal Activity/Legal Involvement Pertinent to Current Situation/Hospitalization: No - Comment as needed  Activities of Daily Living Home Assistive Devices/Equipment: Blood pressure cuff, Grab bars around toilet, Grab bars in shower, Hand-held shower hose, Hospital bed, Wheelchair, Scales ADL Screening (condition at time of admission) Patient's cognitive ability adequate to safely complete daily activities?: No Is the patient deaf or have difficulty hearing?: No Does the patient have  difficulty seeing, even when wearing glasses/contacts?: No Does the patient have difficulty concentrating, remembering, or making decisions?: Yes Patient able to express need for assistance with ADLs?: No Does the patient have difficulty dressing or bathing?: Yes (patient is total care and wheelchair bound) Independently performs ADLs?: No (patient is total care and wheelchair bound) Communication: Independent Dressing (OT): Dependent Is this a change from baseline?: Pre-admission baseline Grooming: Dependent Is this a change from baseline?: Pre-admission baseline Feeding: Dependent Is this a change from baseline?: Pre-admission baseline Bathing: Dependent Is this a change from baseline?: Pre-admission baseline Toileting: Dependent Is this a change from baseline?: Pre-admission baseline In/Out Bed: Dependent Is this a change from baseline?: Pre-admission baseline Walks in Home: Dependent (patient is total care and wheelchair bound) Is this a change from baseline?: Pre-admission baseline Does the patient have difficulty walking or climbing stairs?: Yes (patient is total care and wheelchair bound) Weakness of Legs: Both Weakness of Arms/Hands: Both  Permission Sought/Granted Permission sought to share information with : Family Supports, Chartered certified accountant granted to share information with : Yes, Verbal Permission Granted     Permission granted to share info w AGENCY: Health and safety inspector        Emotional Assessment Appearance:: Appears stated age Attitude/Demeanor/Rapport: Engaged Affect (typically observed): Appropriate Orientation: : Oriented to Self, Oriented to Place, Oriented to  Time, Oriented to Situation Alcohol / Substance Use: Not Applicable Psych Involvement: No (comment)  Admission diagnosis:  Acute blood loss anemia [D62] Symptomatic anemia [D64.9] Patient Active Problem List   Diagnosis Date Noted   Pressure injury of skin 10/13/2021   Lupus  anticoagulant positive    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)    Acute blood loss anemia 10/10/2021   Hypertension    Non-ischemic cardiomyopathy (HCC)    Swelling of left hand    Proctocolitis    Constipation    Uterine fibroid    Left hemiparesis (HCC)    Hemorrhagic stroke (HCC)    Abnormal uterine bleeding (AUB)    Staphylococcus epidermidis bacteremia    Iron deficiency anemia due to chronic blood loss 05/25/2021   Cerebral edema (HCC) 05/25/2021   Chronic HFrEF (heart failure with reduced ejection fraction) ([place]) 05/25/2021   Pneumonia due to methicillin susceptible Staphylococcus aureus ([place]) 05/25/2021   Acute respiratory failure with hypoxia ([place])    Acute right MCA stroke ([place]) 05/15/2021   Uncontrolled hypertension 05/15/2021   Cerebrovascular accident (CVA) ([place]) 05/15/2021   IDA (iron deficiency anemia) 03/01/2021   Healthcare maintenance 03/01/2021   Onychomycosis 03/01/2021   Food insecurity 02/02/2021   Psychosis ([place])    Fibroid 01/11/2021   Menorrhagia 01/11/2021   Acute exacerbation of CHF (congestive heart failure) ([place]) 01/10/2021   AKI (acute kidney injury) ([place])    SVT (supraventricular tachycardia) ([place]) 05/01/2020   CHF (congestive heart failure) ([place]) 05/01/2020   PCP:  Mitzi Hansen, MD Pharmacy:   [place], [place] [address] Phone: [phone] Fax: [phone]  [id number] [place],  - [address] [place]. [place] [address] Phone: [phone] Fax: [place] [address] [place] [address] Phone: [phone] Fax: [phone]     Social Determinants of Health (SDOH) Interventions    Readmission Risk Interventions No flowsheet data found.  Emeterio Reeve, LCSW Clinical Social Worker

## 2021-10-19 NOTE — NC FL2 (Signed)
[place] MEDICAID FL2 LEVEL OF CARE SCREENING TOOL     IDENTIFICATION  [name] Birthdate: Dec 13, 1976 Sex: female Admission Date (Current Location): 10/09/2021  [facility] and [place] Number:  Herbalist and Address:  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)    Acute blood loss anemia 10/10/2021   Hypertension    Non-ischemic cardiomyopathy (HCC)    Swelling of left hand    Proctocolitis    Constipation    Uterine fibroid    Left hemiparesis ([place])    Hemorrhagic stroke (HCC)    Abnormal uterine bleeding (AUB)    Staphylococcus epidermidis bacteremia    Iron deficiency anemia due to chronic blood loss 05/25/2021   Cerebral edema (HCC) 05/25/2021   Chronic HFrEF (heart failure with reduced ejection fraction) ([place]) 05/25/2021   Pneumonia due to methicillin susceptible Staphylococcus aureus ([place]) 05/25/2021   Acute respiratory failure with hypoxia (HCC)    Acute right MCA stroke ([place]) 05/15/2021   Uncontrolled hypertension 05/15/2021   Cerebrovascular accident (CVA) ([place]) 05/15/2021   IDA (iron deficiency anemia) 03/01/2021   Healthcare maintenance 03/01/2021   Onychomycosis 03/01/2021   Food insecurity 02/02/2021   Psychosis ([place])    Fibroid 01/11/2021   Menorrhagia 01/11/2021   Acute exacerbation of CHF (congestive heart failure) ([place]) 01/10/2021   AKI (acute kidney  injury) ([place])    SVT (supraventricular tachycardia) ([place]) 05/01/2020   CHF (congestive heart failure) ([place]) 05/01/2020    Orientation RESPIRATION BLADDER Height & Weight     Self, Time, Situation, Place  Normal Incontinent Weight: 126 lb 12.2 oz (57.5 kg) Height:  5\' 5"  (165.1 cm)  BEHAVIORAL SYMPTOMS/MOOD NEUROLOGICAL BOWEL NUTRITION STATUS      Incontinent Diet (See DC summary)  AMBULATORY STATUS COMMUNICATION OF NEEDS Skin   Extensive Assist Verbally Normal                       Personal Care Assistance Level of Assistance  Bathing, Feeding, Dressing Bathing Assistance: Maximum assistance Feeding assistance: Limited assistance Dressing Assistance: Maximum assistance     Functional Limitations Info  Sight, Hearing, Speech Sight Info: Adequate Hearing Info: Adequate Speech Info: Adequate    SPECIAL CARE FACTORS FREQUENCY                       Contractures Contractures Info: Not present    Additional Factors Info  Code Status, Allergies Code Status Info: Partial Allergies Info: NKA           Current Medications (10/19/2021):  This is the current hospital active medication list Current Facility-Administered Medications  Medication Dose Route Frequency Provider Last Rate Last Admin   0.9 %  sodium chloride infusion   Intravenous PRN Donne Hazel, MD 10 mL/hr at 10/12/21 1506 Infusion Verify at 10/12/21 1506   0.9 %  sodium  chloride infusion  250 mL Intravenous PRN Criselda Peaches, MD       acetaminophen (TYLENOL) tablet 325 mg  325 mg Oral Q4H Lajean Manes, MD   325 mg at 10/19/21 5009   Or   acetaminophen (TYLENOL) suppository 650 mg  650 mg Rectal Q4H Lajean Manes, MD       amiodarone (PACERONE) tablet 200 mg  200 mg Oral Daily Donne Hazel, MD   200 mg at 10/18/21 0911   apixaban (ELIQUIS) tablet 5 mg  5 mg Oral BID Jose Persia, MD   5 mg at 10/18/21 2105   atorvastatin (LIPITOR) tablet 40 mg  40 mg Oral Daily Donne Hazel, MD   40  mg at 10/18/21 0912   baclofen (LIORESAL) tablet 20 mg  20 mg Oral TID Donne Hazel, MD   20 mg at 10/18/21 2105   bisacodyl (DULCOLAX) EC tablet 10 mg  10 mg Oral Daily PRN Lovey Newcomer T, NP   10 mg at 10/12/21 2159   carvedilol (COREG) tablet 25 mg  25 mg Oral BID Donne Hazel, MD   25 mg at 10/18/21 2106   Chlorhexidine Gluconate Cloth 2 % PADS 6 each  6 each Topical Daily Donne Hazel, MD   6 each at 10/18/21 2114   cloNIDine (CATAPRES) tablet 0.1 mg  0.1 mg Oral TID Donne Hazel, MD   0.1 mg at 10/18/21 2106   docusate sodium (COLACE) capsule 100 mg  100 mg Oral BID Criselda Peaches, MD   100 mg at 10/18/21 2105   feeding supplement (ENSURE ENLIVE / ENSURE PLUS) liquid 237 mL  237 mL Oral TID BM Donne Hazel, MD   237 mL at 10/18/21 1541   hydrALAZINE (APRESOLINE) injection 10 mg  10 mg Intravenous Q4H PRN Donne Hazel, MD   10 mg at 10/14/21 3818   hydrALAZINE (APRESOLINE) tablet 100 mg  100 mg Oral TID Donne Hazel, MD   100 mg at 10/18/21 2105   HYDROcodone-acetaminophen (NORCO/VICODIN) 5-325 MG per tablet 1-2 tablet  1-2 tablet Oral Q4H PRN Criselda Peaches, MD   2 tablet at 10/19/21 0133   iohexol (OMNIPAQUE) 300 MG/ML solution 100 mL  100 mL Intra-arterial Once PRN Criselda Peaches, MD       iohexol (OMNIPAQUE) 300 MG/ML solution 100 mL  100 mL Intra-arterial Once PRN Criselda Peaches, MD       isosorbide mononitrate (IMDUR) 24 hr tablet 60 mg  60 mg Oral Daily Donne Hazel, MD   60 mg at 10/18/21 0912   ketorolac (TORADOL) 30 MG/ML injection 30 mg  30 mg Intravenous Q6H Criselda Peaches, MD   30 mg at 10/19/21 0602   MEDLINE mouth rinse  15 mL Mouth Rinse BID Donne Hazel, MD   15 mL at 10/18/21 2114   nystatin (MYCOSTATIN) 100000 UNIT/ML suspension 500,000 Units  5 mL Oral QID Donne Hazel, MD   500,000 Units at 10/18/21 2105   ondansetron [facility]) injection 4 mg  4 mg Intravenous Q6H PRN Criselda Peaches, MD       promethazine  (PHENERGAN) tablet 25 mg  25 mg Oral Q8H PRN Criselda Peaches, MD       Or   promethazine (PHENERGAN) suppository 25 mg  25 mg Rectal Q8H PRN Criselda Peaches, MD       sacubitril-valsartan (ENTRESTO) 97-103 mg per tablet  1 tablet Oral BID Wyline Copas,  Orpah Melter, MD   1 tablet at 10/18/21 2112   sodium chloride flush (NS) 0.9 % injection 3 mL  3 mL Intravenous Q12H Criselda Peaches, MD   3 mL at 10/18/21 2114   sodium chloride flush (NS) 0.9 % injection 3 mL  3 mL Intravenous PRN Criselda Peaches, MD         Discharge Medications: Please see discharge summary for a list of discharge medications.  Relevant Imaging Results:  Relevant Lab Results:   Additional Information SSN: [id number]  Emeterio Reeve, [place]

## 2021-10-20 DIAGNOSIS — D62 Acute posthemorrhagic anemia: Secondary | ICD-10-CM | POA: Diagnosis not present

## 2021-10-20 LAB — CBC
HCT: 26 % — ABNORMAL LOW (ref 36.0–46.0)
Hemoglobin: 8.1 g/dL — ABNORMAL LOW (ref 12.0–15.0)
MCH: 26 pg (ref 26.0–34.0)
MCHC: 31.2 g/dL (ref 30.0–36.0)
MCV: 83.3 fL (ref 80.0–100.0)
Platelets: 297 10*3/uL (ref 150–400)
RBC: 3.12 MIL/uL — ABNORMAL LOW (ref 3.87–5.11)
RDW: 21 % — ABNORMAL HIGH (ref 11.5–15.5)
WBC: 22.1 10*3/uL — ABNORMAL HIGH (ref 4.0–10.5)
nRBC: 0 % (ref 0.0–0.2)

## 2021-10-20 MED ORDER — ACETAMINOPHEN 325 MG PO TABS: 325.0000 mg | ORAL_TABLET | ORAL | Status: DC | PRN

## 2021-10-20 MED ORDER — DOCUSATE SODIUM 50 MG/5ML PO LIQD
100.0000 mg | Freq: Two times a day (BID) | ORAL | Status: DC
  Administered 2021-10-20 – 2021-10-22 (×4): 100 mg via ORAL
  Filled 2021-10-20 (×4): qty 10

## 2021-10-20 MED ORDER — PANTOPRAZOLE SODIUM 40 MG PO TBEC
40.0000 mg | DELAYED_RELEASE_TABLET | Freq: Every day | ORAL | Status: DC
  Administered 2021-10-20 – 2021-10-27 (×8): 40 mg via ORAL
  Filled 2021-10-20 (×8): qty 1

## 2021-10-20 MED ORDER — ACETAMINOPHEN 650 MG RE SUPP: 650.0000 mg | RECTAL | Status: DC | PRN

## 2021-10-20 NOTE — Progress Notes (Signed)
Subjective:  No acute overnight events.  Patient evaluated at bedside. Patient eating breakfast. States pain in bottom is tolerable. Notes dysuria and suprapubic pain. She is unsure of when this started. Called and updated patient's brother. Patient agreeable to in and out cath to check for UTI and to further work up leukocytosis. Patient and brother have no questions at this time.    Objective:   Vital signs in last 24 hours:       Vitals:    10/14/21 2059 10/15/21 0010 10/15/21 0409 10/15/21 0928  BP: 114/63 (!) 103/55 (!) 149/74 (!) 155/70  Pulse: 77 75 72 66  Resp: 18 16 18 18   Temp: 98.4 F (36.9 C) 98.5 F (36.9 C) (!) 97.5 F (36.4 C) 97.8 F (36.6 C)  TempSrc: Oral Oral Axillary Oral  SpO2: 100% 100% 100% 100%  Weight:          Height:            Constitutional: alert, well-appearing, in NAD HENT: normocephalic, atraumatic, mucous membranes moist Eyes: conjunctiva non-erythematous, EOMI Cardiovascular: RRR, holosystolic murmur; Trace pitting edema of LLE  Pulmonary/Chest: normal work of breathing on room air, LCTAB Abdominal: soft, distended abdomen, no guarding or rigidity. 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 female with a hx of fibroids measuring up to 6.5 cm, left-sided hemiplegia from a large right MCA infarct in May 2022 requiring thrombectomy and on Eliquis per neurology in the context of possible lupus  anticoagulant, SVT, HFpEF (04/2021 EF 55-60), chronic anemia, HTN admitted [facility] for acute blood loss anemia 2/2 continuous menorrhagia s/p tranexamic acid and 5 u pRBC transfusion, s/p [place] on 09/98 complicated by leukocytosis.   Acute blood loss anemia 2/2 continuous menorragia from uterine fibroids s/p [place] Leukocytosis  s/p [place] 10/25; Hgb stable at 8.1 this morning, minimal dark drainage from purewick. Does not appear to have acute bleeding. Continues to have leukocytosis of 22.1. Remains afebrile, complains of dysuria and suprapubic pain. Will check UA and culture. Blood cultures from 2/28 with no growth to date. If she continue to have leukocytosis and no signs of UTI consider abdominal imagine for peritonitis/bleeding. - Eliquis x 3 months in setting of provoked left LE DVT and then ASA alone.  - CTM CBC and transfuse if Hg < 7 - Follow blood cultures - In and out cath for UA and urine culture  - Monitor fever curve and cbc - Pain management: tylenol, norco, toradol - 1 month outpatient IR follow up with Dr. Laurence Ferrari   Prior Hx of CVA with residual left sided hemiplegia LLE DVT Lupus Anticoagulant neg on confirmatory test. Resumed Eliquis 10/27 for LE DVT. Hgb remains stable.  - Eliquis x 3 months, then ASA 81 mg qd indefinitely  - Atorvastatin 40 qd - Follow up with Dr Leonie Man in stroke clinic   HTN BP 127/60s.  - Avoid overcorrection of BP given stroke hx -  Continue  Entresto twice daily, Clonidine 0.1mg  three times daily, Hydralazine 100mg  three times daily   Hx of CHF Remains free of signs of fluid overload and euvolemic on exam.   - Continue entresto, coreg, imdur   Hx of SVT on amiodorone  Hemodynamically stable HR controlled. - Continue amiodarone    Best Practice: Diet: Dys 3 diet  IVF: Non VTE: none  Code: Partial code    Iona Beard, MD 10/15/2021, 11:37 AM Pager: [phone] After 5pm on weekdays and 1pm on weekends: On Call pager [phone]

## 2021-10-21 ENCOUNTER — Inpatient Hospital Stay (HOSPITAL_COMMUNITY): Payer: Medicaid - Out of State

## 2021-10-21 DIAGNOSIS — D62 Acute posthemorrhagic anemia: Secondary | ICD-10-CM | POA: Diagnosis not present

## 2021-10-21 LAB — URINALYSIS, ROUTINE W REFLEX MICROSCOPIC
Bacteria, UA: NONE SEEN
Bilirubin Urine: NEGATIVE
Glucose, UA: NEGATIVE mg/dL
Hgb urine dipstick: NEGATIVE
Ketones, ur: NEGATIVE mg/dL
Leukocytes,Ua: NEGATIVE
Nitrite: NEGATIVE
Protein, ur: 30 mg/dL — AB
Specific Gravity, Urine: 1.02 (ref 1.005–1.030)
pH: 5 (ref 5.0–8.0)

## 2021-10-21 LAB — URINE CULTURE: Culture: NO GROWTH

## 2021-10-21 IMAGING — CT CT ABD-PELV W/ CM
2 of 5 series · 16 of 46 positions shown, 18 images · IV contrast (APPLIED)
Comparison: [DATE] CT. [DATE] bilateral uterine artery and
LEFT ovarian artery embolization.

CLINICAL DATA: 45-year-old female with abdominal and pelvic pain.
Recent uterine artery embolization.

EXAM:
CT ABDOMEN AND PELVIS WITH CONTRAST
TECHNIQUE: Multidetector CT imaging of the abdomen and pelvis was performed
using the standard protocol following bolus administration of
intravenous contrast.
CONTRAST:  100mL OMNIPAQUE IOHEXOL 300 MG/ML  SOLN

[Series 3: abd/ pelvis 5.0 i30f 2 · axial · 0.94mm/px · z∈[+976,+1401]mm · 13 of 96 slices shown, 15 images]
[im 6/96  soft-tissue]
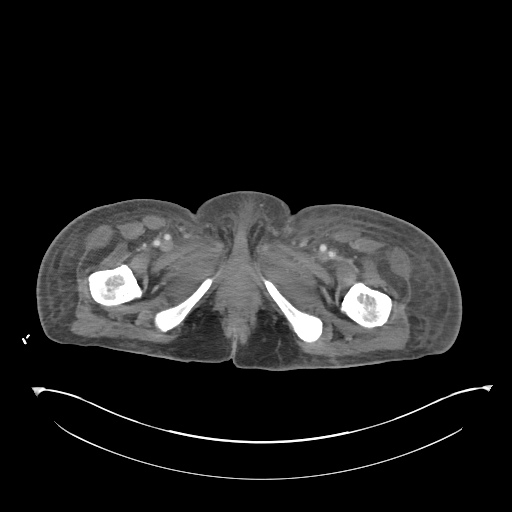
[im 6/96  bone]
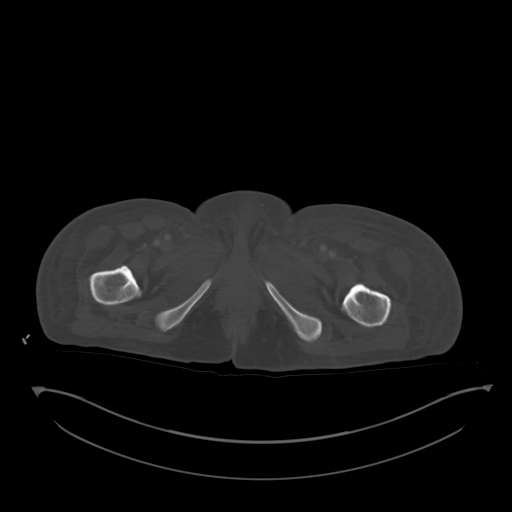
[im 16/96  soft-tissue]
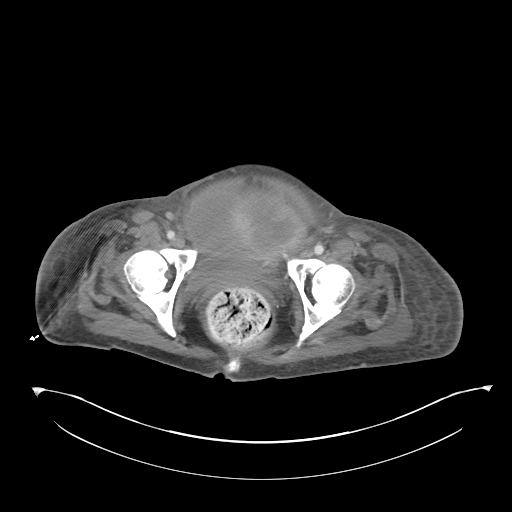
[im 21/96  soft-tissue]
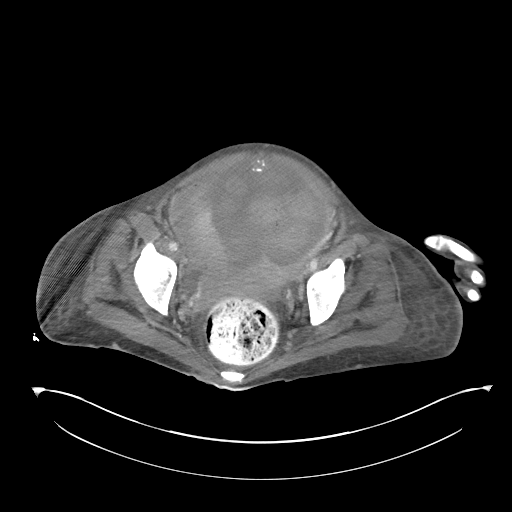
[im 26/96  soft-tissue]
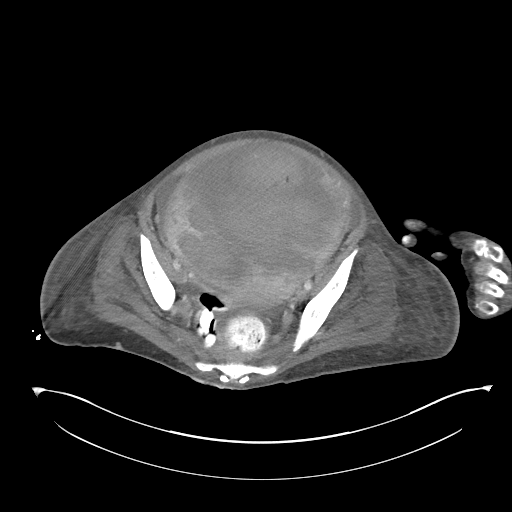
[im 36/96  soft-tissue]
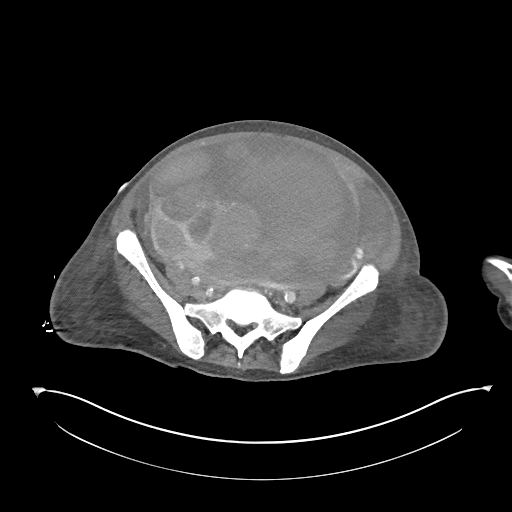
[im 41/96  soft-tissue]
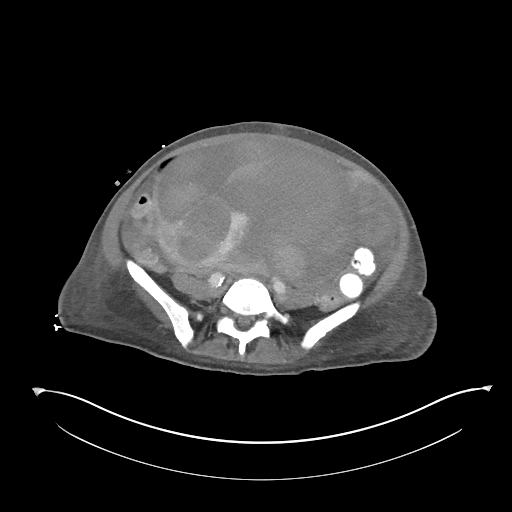
[im 51/96  soft-tissue]
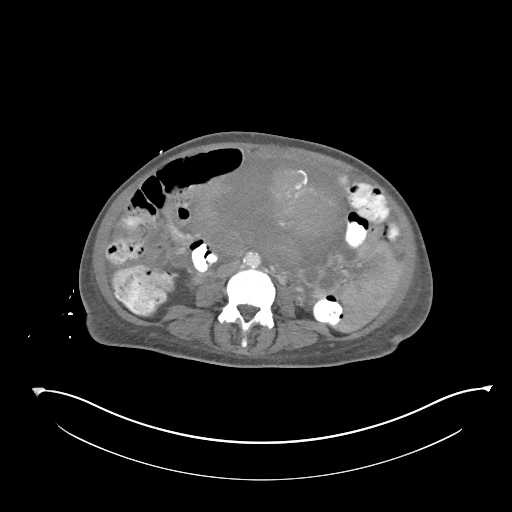
[im 56/96  soft-tissue]
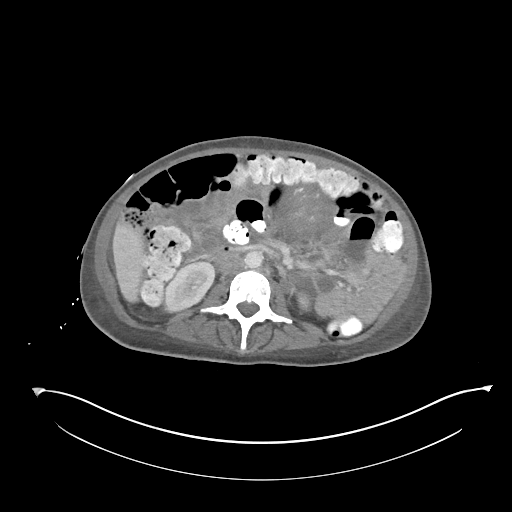
[im 61/96  soft-tissue]
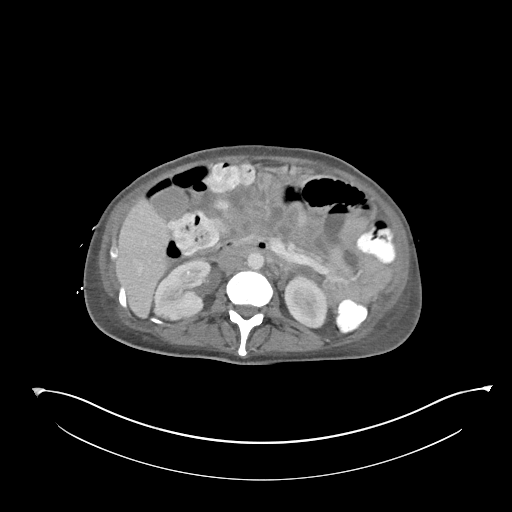
[im 61/96  bone]
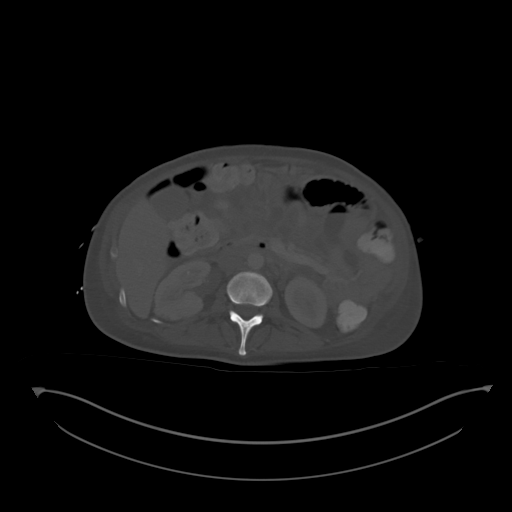
[im 71/96  soft-tissue]
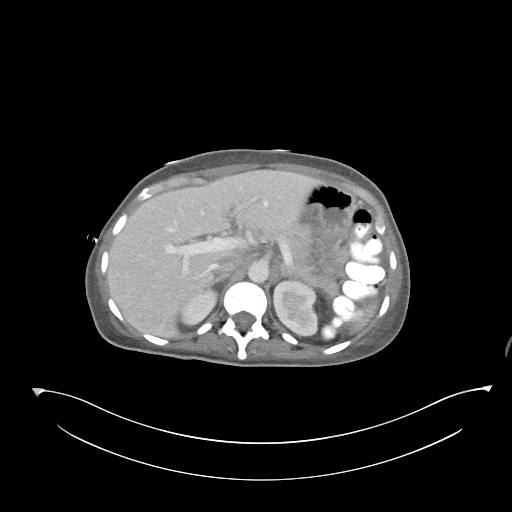
[im 76/96  soft-tissue]
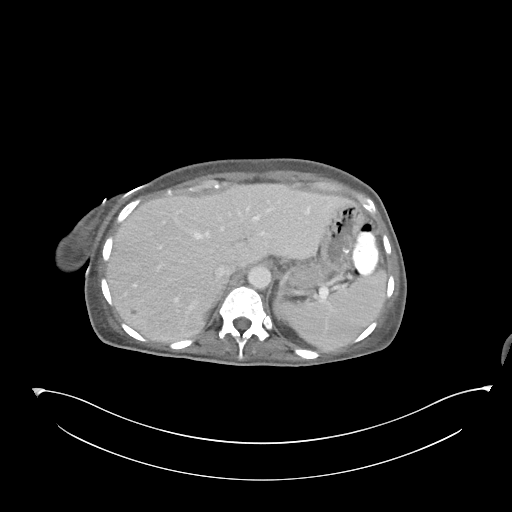
[im 81/96  soft-tissue]
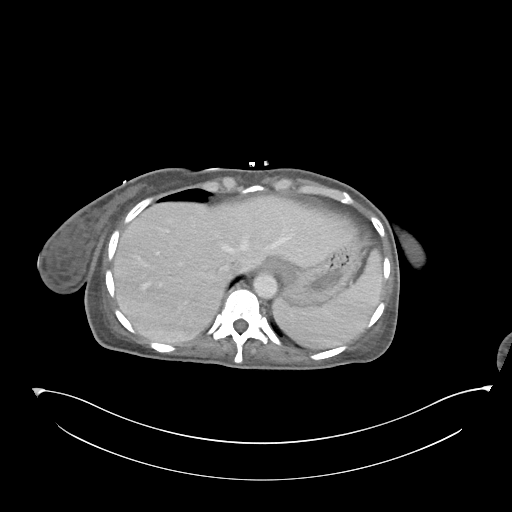
[im 91/96  soft-tissue]
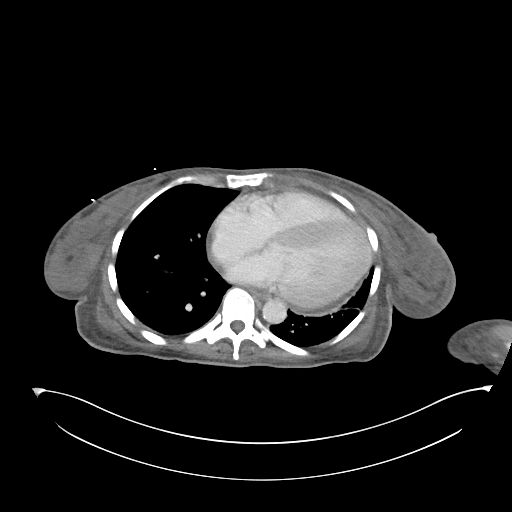

[Series 6: coronal soft tissue · coronal · 0.80mm/px · 3 of 85 slices shown]
[im 29/85  soft-tissue]
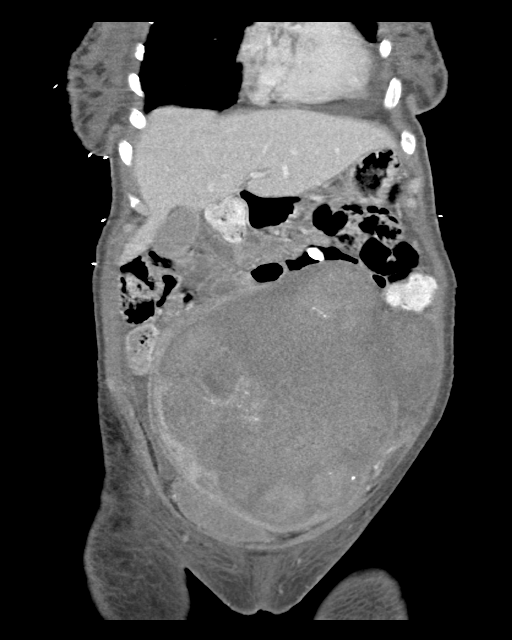
[im 38/85  soft-tissue]
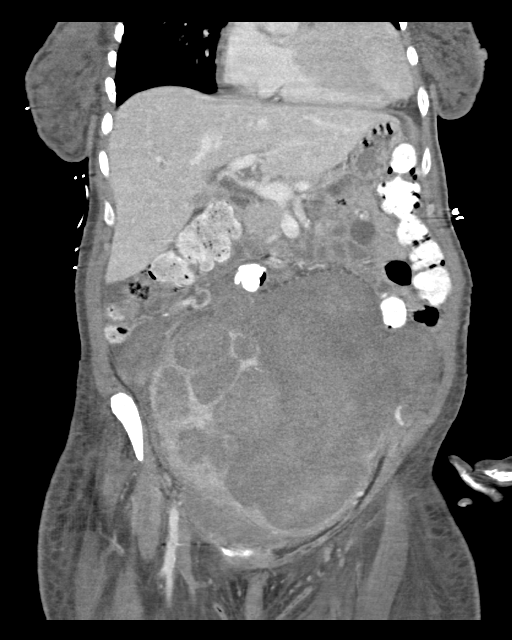
[im 47/85  soft-tissue]
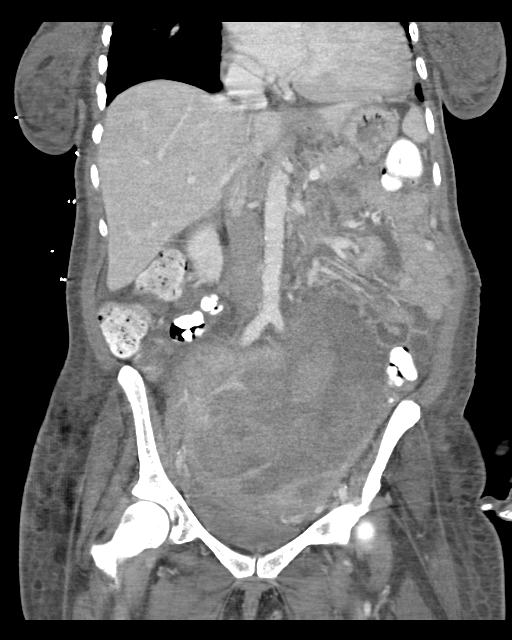

[16 of 46 positions shown; findings below may reference images not displayed]

FINDINGS: Lower chest: Marked cardiomegaly again noted. No acute
abnormalities.

Hepatobiliary: The liver and gallbladder are unremarkable except
tiny probable hepatic cysts.

Pancreas: Unremarkable

Spleen: Unremarkable

Adrenals/Urinary Tract: Unchanged nonobstructing punctate calculi
within the mid-LOWER RIGHT kidney noted. Scattered areas of LEFT
renal scarring are again identified. The adrenal glands and bladder
are unremarkable.

Stomach/Bowel: There is no evidence of bowel obstruction, definite
bowel wall thickening or inflammatory changes.

Vascular/Lymphatic: Aortic atherosclerosis. No enlarged abdominal or
pelvic lymph nodes.

Reproductive: Massively enlarged uterus containing multiple fibroids
are again noted. Significant decrease in uterine enhancement noted
following embolization. A few tiny foci of gas within the LOWER
uterine fibroids are noted, not unexpected following embolization.

Other: No ascites, focal collection or pneumoperitoneum. Diffuse
subcutaneous edema is again noted

Musculoskeletal: No acute or suspicious bony abnormalities are
noted.
IMPRESSION: 1. Massively enlarged uterus containing multiple fibroids again
noted with decreased uterine enhancement following uterine
embolization. Small foci of gas within LOWER uterine fibroids are
not unexpected following embolization. Follow-up clinically.
2. Marked cardiomegaly and diffuse subcutaneous edema again noted.
3. Nonobstructing RIGHT renal calculi.
4. Aortic Atherosclerosis ([5G]-[5G]).

## 2021-10-21 MED ORDER — ACETAMINOPHEN 325 MG PO TABS
650.0000 mg | ORAL_TABLET | ORAL | Status: DC | PRN
  Administered 2021-10-21: 650 mg via ORAL
  Filled 2021-10-21: qty 2

## 2021-10-21 MED ORDER — ACETAMINOPHEN 650 MG RE SUPP: 650.0000 mg | RECTAL | Status: DC | PRN

## 2021-10-21 NOTE — Progress Notes (Signed)
This chaplain responded to the unit page for Pt. spiritual care.  The chaplain is appreciative of the Pt. RN-Robin's update.    The chaplain covered the Pt. up with her blanket from home and offered her emotional support as she asked about her brother-[name]'s next visit. The chaplain shared prayer with the Pt.  The chaplain remained present until the Pt. fell asleep.   This chaplain is available for F/U spiritual care as needed.  Chaplain Sallyanne Kuster [phone]

## 2021-10-21 NOTE — Progress Notes (Signed)
Subjective:  No acute overnight events.  Patient evaluated at bedside. Patient eating breakfast. States pain in bottom is tolerable. Notes dysuria and suprapubic pain. She is unsure of when this started. Called and updated patient's brother. Patient agreeable to in and out cath to check for UTI and to further work up leukocytosis. Patient and brother have no questions at this time.    Objective:   Vital signs in last 24 hours:       Vitals:    10/14/21 2059 10/15/21 0010 10/15/21 0409 10/15/21 0928  BP: 114/63 (!) 103/55 (!) 149/74 (!) 155/70  Pulse: 77 75 72 66  Resp: 18 16 18 18   Temp: 98.4 F (36.9 C) 98.5 F (36.9 C) (!) 97.5 F (36.4 C) 97.8 F (36.6 C)  TempSrc: Oral Oral Axillary Oral  SpO2: 100% 100% 100% 100%  Weight:          Height:            Constitutional: alert, appears uncomfortable HENT: normocephalic, atraumatic, mucous membranes moist Eyes: conjunctiva non-erythematous, EOMI Cardiovascular: RRR, holosystolic murmur; Trace pitting edema of LLE  Pulmonary/Chest: normal work of breathing on room air, LCTAB Abdominal: soft, distended abdomen, no guarding or rigidity. 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 female with a hx of fibroids measuring up to 6.5 cm, left-sided hemiplegia from a large right MCA infarct in May 2022  requiring thrombectomy and on Eliquis per neurology in the context of possible lupus anticoagulant, SVT, HFpEF (04/2021 EF 55-60), chronic anemia, HTN admitted to [facility] for acute blood loss anemia 2/2 continuous menorrhagia s/p tranexamic acid and 5 u pRBC transfusion, s/p [place] on 35/00 complicated by leukocytosis.  Acute blood loss anemia 2/2 continuous menorragia from uterine fibroids s/p [place] Leukocytosis  s/p [place] 10/25; Hgb stable at 8.1 yesterday patient refused labs this morning.  Does appear to have blood draining from her pure wick.  UA positive for protein RBCs WBCs but no bacteria or nitrites.  Urine culture with no growth low suspicion at this time for UTI.  Will check CT abdomen pelvis to evaluate for necrosis and peritonitis secondary to her uterine artery embolization.  Patient with episode of blood pressure of 96/53 around 7 PM yesterday otherwise is normotensive with stable vitals today.  If she becomes more tachycardic or develops hypotension would recheck CBC tonight to assess for bleeding. -CT abdomen pelvis - Eliquis x 3 months in setting of provoked left LE DVT and then ASA alone.  -Monitor CBC and transfuse if Hg < 7 - Follow blood cultures, no growth to date  - Monitor fever curve and cbc - Pain management: tylenol, norco, toradol - 1 month outpatient IR follow up with Dr. Laurence Ferrari   Prior Hx of CVA with residual left sided hemiplegia LLE DVT Lupus Anticoagulant neg on confirmatory test. Resumed Eliquis 10/27 for LE DVT. Hgb  remains stable.  - Eliquis x 3 months, then ASA 81 mg qd indefinitely  - Atorvastatin 40 qd - Follow up with Dr Leonie Man in stroke clinic   Deep tissue injury of the left heel Noted to have ecchymosis of the left posterior lateral heel, no drainage, warmth, purulence -Continue with Prevalon boots -Apply foam dressing to avoid friction  HTN Stable this morning monitor for further episodes of hypotension. - Avoid overcorrection of BP given stroke hx -   Continue Entresto twice daily, Clonidine 0.1mg  three times daily, Hydralazine 100mg  three times daily   Hx of CHF Remains free of signs of fluid overload and euvolemic on exam.   - Continue entresto, coreg, imdur   Hx of SVT on amiodorone  Hemodynamically stable HR controlled. - Continue amiodarone    Best Practice: Diet: Dys 3 diet  IVF: Non VTE: none  Code: Partial code    Iona Beard, MD 10/15/2021, 11:37 AM Pager: [phone] After 5pm on weekdays and 1pm on weekends: On Call pager [phone]

## 2021-10-21 NOTE — Progress Notes (Signed)
Dr Lisabeth Devoid informed pt continues to refuse lab draw, temp 101.0 and light vaginal bleed. Md states will attempt to draw labs tomorrow. Md states ct results "not concerning for infection". Md will place tylenol order

## 2021-10-22 DIAGNOSIS — D62 Acute posthemorrhagic anemia: Secondary | ICD-10-CM | POA: Diagnosis not present

## 2021-10-22 LAB — BASIC METABOLIC PANEL
Anion gap: 6 (ref 5–15)
BUN: 29 mg/dL — ABNORMAL HIGH (ref 6–20)
CO2: 21 mmol/L — ABNORMAL LOW (ref 22–32)
Calcium: 9.8 mg/dL (ref 8.9–10.3)
Chloride: 108 mmol/L (ref 98–111)
Creatinine, Ser: 0.87 mg/dL (ref 0.44–1.00)
GFR, Estimated: >60 mL/min (ref >60)
Glucose, Bld: 112 mg/dL — ABNORMAL HIGH (ref 70–99)
Potassium: 5 mmol/L (ref 3.5–5.1)
Sodium: 135 mmol/L (ref 135–145)

## 2021-10-22 LAB — CBC
HCT: 29.2 % — ABNORMAL LOW (ref 36.0–46.0)
Hemoglobin: 8.5 g/dL — ABNORMAL LOW (ref 12.0–15.0)
MCH: 24.6 pg — ABNORMAL LOW (ref 26.0–34.0)
MCHC: 29.1 g/dL — ABNORMAL LOW (ref 30.0–36.0)
MCV: 84.6 fL (ref 80.0–100.0)
Platelets: 394 10*3/uL (ref 150–400)
RBC: 3.45 MIL/uL — ABNORMAL LOW (ref 3.87–5.11)
RDW: 22.8 % — ABNORMAL HIGH (ref 11.5–15.5)
WBC: 15 10*3/uL — ABNORMAL HIGH (ref 4.0–10.5)
nRBC: 0 % (ref 0.0–0.2)

## 2021-10-22 MED ORDER — POLYETHYLENE GLYCOL 3350 17 G PO PACK
17.0000 g | PACK | Freq: Every day | ORAL | Status: DC
  Administered 2021-10-22 – 2021-10-27 (×6): 17 g via ORAL
  Filled 2021-10-22 (×6): qty 1

## 2021-10-22 MED ORDER — SENNOSIDES-DOCUSATE SODIUM 8.6-50 MG PO TABS
2.0000 | ORAL_TABLET | Freq: Every day | ORAL | Status: DC | PRN
  Administered 2021-10-24: 2 via ORAL
  Filled 2021-10-22: qty 2

## 2021-10-22 NOTE — Progress Notes (Signed)
Subjective:  No acute events overnight. Patient evaluated at bedside. Patient has noticed a little bit of bleeding since yesterday. She also endorses some constipation. There are no other complaints or concerns at this time.   Will call and update patient's brother.    Objective:   Vital signs in last 24 hours:       Vitals:    10/14/21 2059 10/15/21 0010 10/15/21 0409 10/15/21 0928  BP: 114/63 (!) 103/55 (!) 149/74 (!) 155/70  Pulse: 77 75 72 66  Resp: 18 16 18 18   Temp: 98.4 F (36.9 C) 98.5 F (36.9 C) (!) 97.5 F (36.4 C) 97.8 F (36.6 C)  TempSrc: Oral Oral Axillary Oral  SpO2: 100% 100% 100% 100%  Weight:          Height:            Constitutional: alert, appears uncomfortable HENT: normocephalic, atraumatic, mucous membranes moist Eyes: conjunctiva non-erythematous, EOMI Cardiovascular: RRR, holosystolic murmur; trace pitting edema of LLE  Pulmonary/Chest: normal work of breathing on room air, LCTAB Abdominal: soft, distended abdomen, no guarding or rigidity. Mild suprapubic tenderness MSK: normal bulk and tone, diffuse tenderness of the left lower extremity Neurological: A&O x 3, left-sided hemiplegia with left arm contracted Skin: Small area of ecchymosis the posterior lateral left heel, right heel appears normal Urinary: Dark red urine in canister from purewick Psych: anxious, normal affect   Assessment/Plan:   Principal Problem:   Acute blood loss anemia Active Problems:   Menorrhagia   Uncontrolled hypertension   Hemorrhagic stroke [facility])   Uterine fibroid   Left hemiparesis (HCC)   Lupus anticoagulant positive   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)   Pressure injury of skin    45 y.o. 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    Acute blood loss anemia 2/2 menorragia from uterine fibroids s/p [place] Leukocytosis, resolving  S/p [place] 10/25; Hgb stable at ~8.5 over past several days, no signs of profuse bleeding and no obvious blood draining from pure wick. WBC downtrending 22.1 -> 15.0, likely 2/2 postembolization syndrome; UA clean with no growth on urine Cx, CXR negative, CT abd/pelvis without concerns for infection, and BC NG at 3 days. Will withhold from starting empiric abx given no source and clinical improvement, but will start if she clinically deteriorates. Vitals stable today, BP 130/70.  - Eliquis x 3 months in setting of provoked left LE DVT and then ASA alone.  - Monitor CBC and transfuse if Hg < 7 - Follow blood cultures, no growth to date  - Monitor fever curve and CBC - Pain management: tylenol, norco, toradol - 1 month outpatient IR follow up with Dr. Laurence Ferrari - TOC following: d/c to [facility] SNF once medically stable    Prior Hx of CVA with residual left sided hemiplegia Provoked LLE DVT Lupus Anticoagulant neg on confirmatory test. Resumed Eliquis 10/27 for LE DVT. Hgb remains stable.  - Eliquis x 3 months, then ASA 81 mg qd indefinitely  - Atorvastatin 40 qd - Follow up with Dr Leonie Man in stroke clinic   Deep tissue injury of the left heel Noted to have ecchymosis of  the left posterior lateral heel, no drainage, warmth, purulence -Continue with Prevalon boots -Apply foam dressing to avoid friction  HTN Stable this morning, 120/70's, no episodes of hypotension.  - Avoid overcorrection of BP given stroke hx - Continue Entresto twice daily, Clonidine 0.1mg  three times daily, Hydralazine 100mg  three times daily   Hx of CHF Remains free of signs of fluid overload and euvolemic on exam.   - Continue entresto, coreg, imdur   Hx of SVT on amiodorone  Hemodynamically  stable HR controlled. - Continue amiodarone     Best Practice: Diet: Dys 3 diet  IVF: Non VTE: none  Code: Partial code     Lajean Manes, MD  Internal Medicine Resident, PGY-1 [facility]  Pager: [phone] 7:34 AM, 10/22/2021

## 2021-10-23 ENCOUNTER — Inpatient Hospital Stay (HOSPITAL_COMMUNITY): Payer: Medicaid - Out of State

## 2021-10-23 DIAGNOSIS — I82422 Acute embolism and thrombosis of left iliac vein: Secondary | ICD-10-CM | POA: Diagnosis not present

## 2021-10-23 DIAGNOSIS — R609 Edema, unspecified: Secondary | ICD-10-CM

## 2021-10-23 DIAGNOSIS — M7989 Other specified soft tissue disorders: Secondary | ICD-10-CM

## 2021-10-23 DIAGNOSIS — D62 Acute posthemorrhagic anemia: Secondary | ICD-10-CM | POA: Diagnosis not present

## 2021-10-23 LAB — PREPARE RBC (CROSSMATCH)

## 2021-10-23 LAB — CBC
HCT: 23.7 % — ABNORMAL LOW (ref 36.0–46.0)
Hemoglobin: 7.1 g/dL — ABNORMAL LOW (ref 12.0–15.0)
MCH: 24.9 pg — ABNORMAL LOW (ref 26.0–34.0)
MCHC: 30 g/dL (ref 30.0–36.0)
MCV: 83.2 fL (ref 80.0–100.0)
Platelets: 377 10*3/uL (ref 150–400)
RBC: 2.85 MIL/uL — ABNORMAL LOW (ref 3.87–5.11)
RDW: 23.3 % — ABNORMAL HIGH (ref 11.5–15.5)
WBC: 13.2 10*3/uL — ABNORMAL HIGH (ref 4.0–10.5)
nRBC: 0 % (ref 0.0–0.2)

## 2021-10-23 MED ORDER — SODIUM CHLORIDE 0.9% IV SOLUTION: Freq: Once | INTRAVENOUS | Status: DC

## 2021-10-23 MED ORDER — HYDROMORPHONE HCL 1 MG/ML IJ SOLN
0.5000 mg | INTRAMUSCULAR | Status: DC | PRN
  Administered 2021-10-23 – 2021-10-26 (×3): 0.5 mg via INTRAVENOUS
  Filled 2021-10-23 (×4): qty 0.5

## 2021-10-23 MED ORDER — ACETAMINOPHEN 500 MG PO TABS: 1000.0000 mg | ORAL_TABLET | Freq: Four times a day (QID) | ORAL | Status: DC | PRN

## 2021-10-23 MED ORDER — OXYCODONE HCL 5 MG PO TABS
5.0000 mg | ORAL_TABLET | ORAL | Status: DC | PRN
  Administered 2021-10-23: 5 mg via ORAL
  Filled 2021-10-23: qty 1

## 2021-10-23 MED ORDER — ACETAMINOPHEN 500 MG PO TABS
1000.0000 mg | ORAL_TABLET | Freq: Four times a day (QID) | ORAL | Status: DC
  Administered 2021-10-23 – 2021-10-27 (×10): 1000 mg via ORAL
  Filled 2021-10-23 (×12): qty 2

## 2021-10-23 NOTE — Evaluation (Signed)
Occupational Therapy Evaluation Patient Details Name: Holly Bradley MRN: [id number] DOB: 1976-09-03 Today's Date: 10/23/2021   History of Present Illness 45 yo female presenting to [facility] ED on 10/18 with continuous menorrhagia; hemoglobin was found to be low (hgb 3.0). Transfer to [facility] on 10/23. PMH including CVA with hx of fibroids, left-sided hemiplegia, SVT, CHF, chronic anemia, hypertension.   Clinical Impression   PTA, pt was at SNF and required assistance for ADLs and lift for OOB. Pt currently requiring Total A for ADLs and bed mobility. Pt reporting significant pain at buttocks both in supine and sitting positions. Facilitating PROM of L scapula, shoulder, elbow, and wrist. Pt reporting Total A for sitting balance at EOB. Positioning pt in sidelying on L with hips stabilized by pillows and L scapula stabilized in slight protraction. Pt reporting comfort in sidelying. Notified RN and recommend repositioning in 30-60 minutes pending pt comfort. Recommend dc to long term care once medically stable per physician. Pt at baseline and all acute OT needs met. Will sign off.   Recommendations for follow up therapy are one component of a multi-disciplinary discharge planning process, led by the attending physician.  Recommendations may be updated based on patient status, additional functional criteria and insurance authorization.   Follow Up Recommendations  Long-term institutional care without follow-up therapy    Assistance Recommended at Discharge Frequent or constant Supervision/Assistance  Functional Status Assessment  Patient has not had a recent decline in their functional status  Equipment Recommendations  Other (comment) (Defer to next venue)    Recommendations for Other Services       Precautions / Restrictions Precautions Precautions: Fall Precaution Comments: left hemiplegia, left shoulder subluxation, lifted to w/c at SNF Restrictions Weight Bearing Restrictions: No       Mobility Bed Mobility Overal bed mobility: Needs Assistance Bed Mobility: Rolling;Sidelying to Sit;Sit to Sidelying Rolling: Max assist Sidelying to sit: Total assist;+2 for physical assistance     Sit to sidelying: Total assist;+2 for physical assistance General bed mobility comments: Cueing pt to reach to bedrail for pulling over to L side. Total A +2 for elevating trunk adn lower BLEs to sit at EOB. Total A +2 for returning to sidelying at end of session    Transfers                   General transfer comment: Defer for safety      Balance Overall balance assessment: Needs assistance Sitting-balance support: Single extremity supported;Feet supported Sitting balance-Leahy Scale: Zero Sitting balance - Comments: Max A for sitting balance due to heavy L and posterior lean. Postural control: Posterior lean;Left lateral lean                                 ADL either performed or assessed with clinical judgement   ADL Overall ADL's : At baseline                                       General ADL Comments: Total  for ADLs and bed mobility     Vision Patient Visual Report: No change from baseline       Perception     Praxis      Pertinent Vitals/Pain Pain Assessment: Faces Faces Pain Scale: Hurts even more Pain Location: everywhere but mostly Left UE and legs especially with  movement Pain Descriptors / Indicators: Grimacing Pain Intervention(s): Limited activity within patient's tolerance;Repositioned;Monitored during session;RN gave pain meds during session     Hand Dominance Right   Extremity/Trunk Assessment Upper Extremity Assessment Upper Extremity Assessment: RUE deficits/detail;LUE deficits/detail RUE Deficits / Details: MMT 4/5 grip and bicep strength, shoulder flexion 4-/5 LUE Deficits / Details: LUE dense hemiplegia, no active movement, bicep reflex present. 1 finger width shoulder subluxation. LUE Sensation: WNL    Lower Extremity Assessment Lower Extremity Assessment: Defer to PT evaluation RLE Deficits / Details: grossly 2+/5, pt denies numbness/tingling, reports intact sensation bilaterally LLE Deficits / Details: increased PF tone with forced DF, keeps ankle in resting plantar flexion with inversion       Communication Communication Communication: Expressive difficulties (increased time, slow speech)   Cognition Arousal/Alertness: Awake/alert Behavior During Therapy: Flat affect Overall Cognitive Status: Within Functional Limits for tasks assessed                                       General Comments  RN giving pain meds during session    Exercises Exercises: General Upper Extremity;General Lower Extremity;Other exercises General Exercises - Upper Extremity Shoulder Flexion: PROM;Left;10 reps;Seated Elbow Flexion: PROM;Left;Sidelying Elbow Extension: PROM;Left;10 reps;Sidelying Wrist Flexion: PROM;Left;10 reps;Seated Wrist Extension: PROM;Left;10 reps;Seated General Exercises - Lower Extremity Heel Slides: PROM;Right;10 reps;Supine Hip ABduction/ADduction: PROM;Right;10 reps;Seated Other Exercises Other Exercises: Scapular protraction/retraction; x5; seated Other Exercises: Scapular rotation;x5;seated   Shoulder Instructions      Home Living Family/patient expects to be discharged to:: Skilled nursing facility                                        Prior Functioning/Environment Prior Level of Function : Needs assist  Cognitive Assist : ADLs (cognitive)   ADLs (Cognitive): Intermittent cues Physical Assist : ADLs (physical);Mobility (physical) Mobility (physical): Transfers;Bed mobility ADLs (physical): Grooming;Bathing;Dressing;Toileting Mobility Comments: Reports staff uses lift to get OOB once a day ADLs Comments: Nrusing staff assisting with ADLs        OT Problem List: Pain;Decreased strength;Decreased range of motion;Decreased  activity tolerance;Decreased cognition;Decreased safety awareness;Decreased knowledge of use of DME or AE;Decreased knowledge of precautions      OT Treatment/Interventions:      OT Goals(Current goals can be found in the care plan section) Acute Rehab OT Goals Patient Stated Goal: Stop pain OT Goal Formulation: All assessment and education complete, DC therapy  OT Frequency:     Barriers to D/C:            Co-evaluation              AM-PAC OT "6 Clicks" Daily Activity     Outcome Measure Help from another person eating meals?: A Little Help from another person taking care of personal grooming?: A Little Help from another person toileting, which includes using toliet, bedpan, or urinal?: Total Help from another person bathing (including washing, rinsing, drying)?: A Lot Help from another person to put on and taking off regular upper body clothing?: A Lot Help from another person to put on and taking off regular lower body clothing?: Total 6 Click Score: 12   End of Session Nurse Communication: Mobility status  Activity Tolerance: Patient limited by pain Patient left: in bed;with call bell/phone within reach  OT Visit Diagnosis: Pain;Muscle weakness (generalized) (  M62.81)                Time: 8828-0034 OT Time Calculation (min): 43 min Charges:  OT General Charges $OT Visit: 1 Visit OT Evaluation $OT Eval Moderate Complexity: 1 Mod OT Treatments $Self Care/Home Management : 23-37 mins  Rylynn Kobs MSOT, OTR/L Acute Rehab Pager: [phone] Office: [place] 10/23/2021, 10:49 AM

## 2021-10-23 NOTE — Progress Notes (Signed)
Subjective:  No acute events overnight.  Patient evaluated at bedside. Patient has noticed a little bit of bleeding since yesterday but no blood clots. She also endorses some constipation. In addition, patient expresses abdominal pain. There are no other complaints or concerns at this time.     Objective:   Vital signs in last 24 hours:       Vitals:    10/14/21 2059 10/15/21 0010 10/15/21 0409 10/15/21 0928  BP: 114/63 (!) 103/55 (!) 149/74 (!) 155/70  Pulse: 77 75 72 66  Resp: 18 16 18 18   Temp: 98.4 F (36.9 C) 98.5 F (36.9 C) (!) 97.5 F (36.4 C) 97.8 F (36.6 C)  TempSrc: Oral Oral Axillary Oral  SpO2: 100% 100% 100% 100%  Weight:          Height:            BP: 138/74, 121/58, 114/55 HR: 79, 82,89 Resp: 16 Temp: 98.9 F, 99.2 SpO2: 99  Hgb 8.5 -> 7.1  Constitutional: alert, appears uncomfortable HENT: normocephalic, atraumatic, mucous membranes moist Eyes: conjunctiva non-erythematous, EOMI Cardiovascular: RRR, holosystolic murmur; trace pitting edema of LLE  Pulmonary/Chest: normal work of breathing on room air, LCTAB Abdominal: soft, distended abdomen, no guarding or rigidity. Mild suprapubic tenderness MSK: normal bulk and tone, diffuse tenderness of the left lower extremity Neurological: A&O x 3, left-sided hemiplegia with left arm contracted Skin: Small area of ecchymosis the posterior lateral left heel, right heel appears normal Urinary: Dark red urine in canister from purewick Psych: anxious, normal affect   Assessment/Plan:   Principal Problem:   Acute blood loss anemia Active Problems:   Menorrhagia   Uncontrolled hypertension   Hemorrhagic stroke [facility])   Uterine fibroid   Left hemiparesis (HCC)   Lupus anticoagulant positive   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)   Pressure injury of skin    45 y.o. 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   Acute blood loss anemia 2/2 menorragia from uterine fibroids s/p [place] Leukocytosis, resolving  S/p [place] 10/25; Hgb stable at ~8.5 over past several days, no signs of profuse bleeding and no obvious blood draining from pure wick. WBC downtrending 22.1 -> 15.0 -> 13.2, likely 2/2 postembolization syndrome; UA clean with no growth on urine Cx, CXR negative, CT abd/pelvis without concerns for infection, and BC NG at 3 days. Will withhold from starting empiric abx given no source and clinical improvement, but will start if she clinically deteriorates. Vitals stable today, BP 138/74.  - Hold Eliquis due to anemia (Hgb 7.1) - Transfuse 1 unit RBC due to anemia - Monitor CBC and transfuse if Hg < 7 - Follow blood cultures, no growth to date  - Monitor fever curve and CBC - Pain management: tylenol (scheduled), norco, toradol, dilaudid - 1 month outpatient IR follow up with Dr. Laurence Ferrari  - Call OBGYN discuss options  - TOC following: d/c to [facility] SNF once medically stable    Prior Hx of CVA with residual left sided hemiplegia Provoked LLE DVT Lupus Anticoagulant neg on confirmatory test. Resumed Eliquis 10/27 for LE DVT. Hgb remains stable.  - Eliquis x 3 months,  then ASA 81 mg qd indefinitely  - Atorvastatin 40 qd - Follow up with Dr Leonie Man in stroke clinic   Deep tissue injury of the left heel Noted to have ecchymosis of the left posterior lateral heel, no drainage, warmth, purulence -Continue with Prevalon boots -Apply foam dressing to avoid friction  HTN Stable this morning, 120/70's, no episodes of hypotension.  - Avoid overcorrection of BP given stroke hx - Continue Entresto twice daily, Clonidine 0.1mg  three times daily,  Hydralazine 100mg  three times daily   Hx of CHF Remains free of signs of fluid overload and euvolemic on exam.   - Continue entresto, coreg, imdur   Hx of SVT on amiodorone  Hemodynamically stable HR controlled. - Continue amiodarone     Best Practice: Diet: Dys 3 diet  IVF: Non VTE: none  Code: Partial code     France Ravens, MD Internal Medicine Resident, PGY-1 [facility]  Pager: [phone] 7:32 AM, 10/23/2021

## 2021-10-23 NOTE — Progress Notes (Signed)
Notified Dr. Willy Eddy that the patient refused blood work this morning and her type and screen. MD is going to talk to the patient.

## 2021-10-23 NOTE — Progress Notes (Signed)
Mobility Specialist Progress Note   10/23/21 1300  Mobility  Activity Transferred:  Chair to bed  Level of Assistance Dependent, patient does less than 25%  Assistive Device MaxiMove  Mobility  (Out of chair to bed for imaging)  Mobility Response Tolerated fair  Mobility performed by Mobility specialist;Nurse tech  $Mobility charge 1 Mobility   Received pt in chair needing to be transferred to bed for a CT scan. Pt showing visible signs of being uncomfortable through transfer but no stated pain. Pt left w/ NT and imaging team in the room.  Holland Falling Mobility Specialist Phone Number [phone]

## 2021-10-23 NOTE — Progress Notes (Signed)
Lower extremity venous has been completed.   Preliminary results in CV Proc.   Jinny Blossom Avion Kutzer 10/23/2021 1:41 PM

## 2021-10-23 NOTE — Progress Notes (Signed)
Physical Therapy Treatment and Discharge Patient Details Name: Holly Bradley MRN: [id number] DOB: 12-01-1976 Today's Date: 10/23/2021   History of Present Illness 45 yo female presenting to [facility] ED on 10/18 with continuous menorrhagia; hemoglobin was found to be low (hgb 3.0). Transfer to [facility] on 10/23. PMH including CVA with hx of fibroids, left-sided hemiplegia, SVT, CHF, chronic anemia, hypertension.    PT Comments    Session Focused on assessing tolerance of the Maximove as a safe and reliable method for transferring pt OOB to the recliner; Overall, pt tolerated well, and I have contacted Mobility Specialist re: getting pt on their list to help with transfers OOB daily; at this point, pt is at baseline functioning, and all further PT needs can be met at her SNF, where it is recommended she start an episode of PT care; will sign off acutely, and hand off pt to Mobility for daily follow up   Recommendations for follow up therapy are one component of a multi-disciplinary discharge planning process, led by the attending physician.  Recommendations may be updated based on patient status, additional functional criteria and insurance authorization.  Follow Up Recommendations  Skilled nursing-short term rehab (<3 hours/day)     Assistance Recommended at Discharge Frequent or constant Supervision/Assistance  Equipment Recommendations  None recommended by PT    Recommendations for Other Services       Precautions / Restrictions Precautions Precautions: Fall Precaution Comments: left hemiplegia, left shoulder subluxation, lifted to w/c at SNF     Mobility  Bed Mobility Overal bed mobility: Needs Assistance Bed Mobility: Rolling Rolling: Max assist         General bed mobility comments: Max assist to roll R and tehn L for placement of Maximove pad    Transfers Overall transfer level: Needs assistance Equipment used: Ambulation equipment used Transfers:  International aid/development worker  transfer)             General transfer comment: Used Maximove to safely transfer pt OOB to chair; noting grimacing during transfer, but ultimately she indicated the Maximove was not any more painful than the transfer lift she uses at the sNF Transfer via Lift Equipment: [employer]  Ambulation/Gait                 Marine scientist [place] (Stroke Patients Only)       Balance                                            Cognition Arousal/Alertness: Awake/alert Behavior During Therapy: Flat affect Overall Cognitive Status: Within Functional Limits for tasks assessed                                 General Comments: Slow to respond to questions; Give her time to  answer        Exercises      General Comments General comments (skin integrity, edema, etc.): Positioned in chair and preparing to eat lunch with assistance      Pertinent Vitals/Pain Pain Assessment: Faces Faces Pain Scale: Hurts little more Pain Location: everywhere but mostly Left UE and legs especially with movement Pain Descriptors / Indicators: Grimacing Pain Intervention(s): Monitored during session;Repositioned    Home Living  Prior Function            PT Goals (current goals can now be found in the care plan section) Acute Rehab PT Goals Patient Stated Goal: Did not state, but agreeable to getting OOB Progress towards PT goals: Goals met/education completed, patient discharged from PT    Frequency    Min 2X/week      PT Plan Current plan remains appropriate    Co-evaluation              AM-PAC PT "6 Clicks" Mobility   Outcome Measure  Help needed turning from your back to your side while in a flat bed without using bedrails?: Total Help needed moving from lying on your back to sitting on the side of a flat bed without using bedrails?: Total Help needed moving to  and from a bed to a chair (including a wheelchair)?: Total Help needed standing up from a chair using your arms (e.g., wheelchair or bedside chair)?: Total Help needed to walk in hospital room?: Total Help needed climbing 3-5 steps with a railing? : Total 6 Click Score: 6    End of Session Equipment Utilized During Treatment:  (Lift pad) Activity Tolerance: Patient tolerated treatment well Patient left: in chair;with call bell/phone within reach;with nursing/sitter in room Nurse Communication: Mobility status;Need for lift equipment (REcommend Maximove for OOB to recliner transfers) PT Visit Diagnosis: Muscle weakness (generalized) (M62.81);Hemiplegia and hemiparesis Hemiplegia - Right/Left: Left Hemiplegia - dominant/non-dominant: Non-dominant     Time: 8676-7209 PT Time Calculation (min) (ACUTE ONLY): 31 min  Charges:  $Therapeutic Activity: 23-37 mins                     Roney Marion, PT  Acute Rehabilitation Services Pager [phone] Office [place] 10/23/2021, 4:29 PM

## 2021-10-24 ENCOUNTER — Encounter (HOSPITAL_COMMUNITY): Payer: Self-pay | Admitting: Internal Medicine

## 2021-10-24 ENCOUNTER — Encounter (HOSPITAL_COMMUNITY)
Admission: EM | Disposition: A | Discharge status: Skilled Nursing Facility | Payer: Self-pay | Source: Skilled Nursing Facility | Attending: Internal Medicine

## 2021-10-24 ENCOUNTER — Encounter (HOSPITAL_COMMUNITY): Payer: Self-pay | Admitting: Anesthesiology

## 2021-10-24 DIAGNOSIS — D500 Iron deficiency anemia secondary to blood loss (chronic): Secondary | ICD-10-CM

## 2021-10-24 DIAGNOSIS — I82412 Acute embolism and thrombosis of left femoral vein: Secondary | ICD-10-CM

## 2021-10-24 DIAGNOSIS — D259 Leiomyoma of uterus, unspecified: Secondary | ICD-10-CM

## 2021-10-24 DIAGNOSIS — I69354 Hemiplegia and hemiparesis following cerebral infarction affecting left non-dominant side: Secondary | ICD-10-CM

## 2021-10-24 DIAGNOSIS — D62 Acute posthemorrhagic anemia: Secondary | ICD-10-CM | POA: Diagnosis not present

## 2021-10-24 DIAGNOSIS — Z79899 Other long term (current) drug therapy: Secondary | ICD-10-CM

## 2021-10-24 DIAGNOSIS — N921 Excessive and frequent menstruation with irregular cycle: Secondary | ICD-10-CM | POA: Diagnosis not present

## 2021-10-24 DIAGNOSIS — I110 Hypertensive heart disease with heart failure: Secondary | ICD-10-CM

## 2021-10-24 DIAGNOSIS — D649 Anemia, unspecified: Secondary | ICD-10-CM | POA: Diagnosis not present

## 2021-10-24 DIAGNOSIS — F17210 Nicotine dependence, cigarettes, uncomplicated: Secondary | ICD-10-CM

## 2021-10-24 DIAGNOSIS — I509 Heart failure, unspecified: Secondary | ICD-10-CM

## 2021-10-24 DIAGNOSIS — I82422 Acute embolism and thrombosis of left iliac vein: Secondary | ICD-10-CM

## 2021-10-24 HISTORY — PX: IVC FILTER INSERTION: CATH118245

## 2021-10-24 LAB — BASIC METABOLIC PANEL
Anion gap: 8 (ref 5–15)
BUN: 19 mg/dL (ref 6–20)
CO2: 20 mmol/L — ABNORMAL LOW (ref 22–32)
Calcium: 10.2 mg/dL (ref 8.9–10.3)
Chloride: 108 mmol/L (ref 98–111)
Creatinine, Ser: 0.68 mg/dL (ref 0.44–1.00)
GFR, Estimated: >60 mL/min (ref >60)
Glucose, Bld: 86 mg/dL (ref 70–99)
Potassium: 5.2 mmol/L — ABNORMAL HIGH (ref 3.5–5.1)
Sodium: 136 mmol/L (ref 135–145)

## 2021-10-24 LAB — CULTURE, BLOOD (ROUTINE X 2)
Culture: NO GROWTH
Culture: NO GROWTH
Special Requests: ADEQUATE
Special Requests: ADEQUATE

## 2021-10-24 LAB — CBC WITH DIFFERENTIAL/PLATELET
Abs Immature Granulocytes: 0.08 10*3/uL — ABNORMAL HIGH (ref 0.00–0.07)
Basophils Absolute: 0 10*3/uL (ref 0.0–0.1)
Basophils Relative: 0 %
Eosinophils Absolute: 0 10*3/uL (ref 0.0–0.5)
Eosinophils Relative: 0 %
HCT: 34.2 % — ABNORMAL LOW (ref 36.0–46.0)
Hemoglobin: 10.9 g/dL — ABNORMAL LOW (ref 12.0–15.0)
Immature Granulocytes: 1 %
Lymphocytes Relative: 5 %
Lymphs Abs: 0.6 10*3/uL — ABNORMAL LOW (ref 0.7–4.0)
MCH: 26.3 pg (ref 26.0–34.0)
MCHC: 31.9 g/dL (ref 30.0–36.0)
MCV: 82.6 fL (ref 80.0–100.0)
Monocytes Absolute: 0.8 10*3/uL (ref 0.1–1.0)
Monocytes Relative: 6 %
Neutro Abs: 10.9 10*3/uL — ABNORMAL HIGH (ref 1.7–7.7)
Neutrophils Relative %: 88 %
Platelets: 455 10*3/uL — ABNORMAL HIGH (ref 150–400)
RBC: 4.14 MIL/uL (ref 3.87–5.11)
RDW: 21.4 % — ABNORMAL HIGH (ref 11.5–15.5)
WBC: 12.4 10*3/uL — ABNORMAL HIGH (ref 4.0–10.5)
nRBC: 0 % (ref 0.0–0.2)

## 2021-10-24 SURGERY — IVC FILTER INSERTION
Anesthesia: LOCAL

## 2021-10-24 SURGERY — INSERTION, FILTER, VENA CAVA
Anesthesia: General

## 2021-10-24 MED ORDER — SODIUM CHLORIDE 0.9% FLUSH
3.0000 mL | Freq: Two times a day (BID) | INTRAVENOUS | Status: DC
  Administered 2021-10-25 – 2021-10-27 (×6): 3 mL via INTRAVENOUS

## 2021-10-24 MED ORDER — SODIUM CHLORIDE 0.9 % WEIGHT BASED INFUSION: 1.0000 mL/kg/h | INTRAVENOUS | Status: AC

## 2021-10-24 MED ORDER — LIDOCAINE HCL (PF) 1 % IJ SOLN
INTRAMUSCULAR | Status: AC
  Filled 2021-10-24: qty 30

## 2021-10-24 MED ORDER — SODIUM CHLORIDE 0.9 % IV SOLN: 250.0000 mL | INTRAVENOUS | Status: DC | PRN

## 2021-10-24 MED ORDER — LACTATED RINGERS IV SOLN: INTRAVENOUS | Status: DC

## 2021-10-24 MED ORDER — HEPARIN (PORCINE) IN NACL 1000-0.9 UT/500ML-% IV SOLN
INTRAVENOUS | Status: DC | PRN
  Administered 2021-10-24: 500 mL

## 2021-10-24 MED ORDER — FENTANYL CITRATE (PF) 100 MCG/2ML IJ SOLN
INTRAMUSCULAR | Status: DC | PRN
  Administered 2021-10-24 (×2): 25 ug via INTRAVENOUS

## 2021-10-24 MED ORDER — HYDRALAZINE HCL 20 MG/ML IJ SOLN: 5.0000 mg | INTRAMUSCULAR | Status: DC | PRN

## 2021-10-24 MED ORDER — BISACODYL 5 MG PO TBEC
10.0000 mg | DELAYED_RELEASE_TABLET | Freq: Every day | ORAL | Status: DC
  Administered 2021-10-25 – 2021-10-27 (×3): 10 mg via ORAL
  Filled 2021-10-24 (×4): qty 2

## 2021-10-24 MED ORDER — BISACODYL 10 MG RE SUPP
10.0000 mg | Freq: Once | RECTAL | Status: AC
  Administered 2021-10-24: 10 mg via RECTAL
  Filled 2021-10-24: qty 1

## 2021-10-24 MED ORDER — SENNOSIDES-DOCUSATE SODIUM 8.6-50 MG PO TABS
2.0000 | ORAL_TABLET | Freq: Two times a day (BID) | ORAL | Status: DC
  Administered 2021-10-24 – 2021-10-27 (×7): 2 via ORAL
  Filled 2021-10-24 (×7): qty 2

## 2021-10-24 MED ORDER — OXYCODONE HCL 5 MG PO TABS
5.0000 mg | ORAL_TABLET | Freq: Four times a day (QID) | ORAL | Status: DC
  Administered 2021-10-24 – 2021-10-26 (×8): 5 mg via ORAL
  Filled 2021-10-24 (×9): qty 1

## 2021-10-24 MED ORDER — CHLORHEXIDINE GLUCONATE 0.12 % MT SOLN
15.0000 mL | OROMUCOSAL | Status: AC
  Filled 2021-10-24: qty 15

## 2021-10-24 MED ORDER — FENTANYL CITRATE (PF) 100 MCG/2ML IJ SOLN
INTRAMUSCULAR | Status: AC
  Filled 2021-10-24: qty 2

## 2021-10-24 MED ORDER — ONDANSETRON HCL 4 MG/2ML IJ SOLN: 4.0000 mg | Freq: Four times a day (QID) | INTRAMUSCULAR | Status: DC | PRN

## 2021-10-24 MED ORDER — FOOD THICKENER (SIMPLYTHICK): 1.0000 | ORAL | Status: DC | PRN

## 2021-10-24 MED ORDER — ACETAMINOPHEN 325 MG PO TABS: 650.0000 mg | ORAL_TABLET | ORAL | Status: DC | PRN

## 2021-10-24 MED ORDER — SODIUM CHLORIDE 0.9% FLUSH: 3.0000 mL | INTRAVENOUS | Status: DC | PRN

## 2021-10-24 SURGICAL SUPPLY — 8 items
CATH ANGIO 5F PIGTAIL 65CM (CATHETERS) ×2 IMPLANT
CATH HEADHUNTER H1 5F 100CM (CATHETERS) ×2 IMPLANT
FILTER VC CELECT-FEMORAL (Filter) ×2 IMPLANT
PROTECTION STATION PRESSURIZED (MISCELLANEOUS) ×2
SHEATH PROBE COVER 6X72 (BAG) ×2 IMPLANT
STATION PROTECTION PRESSURIZED (MISCELLANEOUS) ×1 IMPLANT
TRAY PV CATH (CUSTOM PROCEDURE TRAY) ×2 IMPLANT
WIRE BENTSON .035X145CM (WIRE) ×2 IMPLANT

## 2021-10-24 NOTE — Progress Notes (Signed)
RN paged regarding patient's abdominal pain.  Patient evaluated at the bedside, endorsing lower abdominal pain which she states is worse with deep breaths.  States that this pain has been going on for an hour.  Denies any nausea or vomiting.  States that she has not had a bowel movement about 2 days.  Does not want an enema.  Physical exam- General: Ill-appearing woman, no acute distress, resting comfortably Abdomen: Distended, bowel sounds present, tender to palpation in lower quadrants   Assessment/plan- Patient asked why she is continuing to have abdominal pain.  Discussed that this is likely related to her uterine fibroids and uterine artery embolization.  Also possibly related to her constipation.  Recommended uptitrating her bowel regimen and continuing to monitor.   Harlow Ohms, DO PGY-3 IMTS

## 2021-10-24 NOTE — Progress Notes (Signed)
This nurse and MD called and spoke with brother, Carmie Lanpher. MD explained procedure via telephone. Both MD and myself obtained consent while on phone. Consent placed in chart.

## 2021-10-24 NOTE — Progress Notes (Signed)
Patient completed 1 unit of PRBC . H and H post transfusion order in.

## 2021-10-24 NOTE — Anesthesia Preprocedure Evaluation (Deleted)
Anesthesia Evaluation    Reviewed: Allergy & Precautions, Patient's Chart, lab work & pertinent test results, reviewed documented beta blocker date and time   Airway        Dental   Pulmonary Current Smoker,           Cardiovascular hypertension (poorly controlled), Pt. on medications and Pt. on home beta blockers pulmonary hypertension (mod pHTN)+CHF (grade 2 diastolic dysfunction) and + DVT (eliquis)  + dysrhythmias (PSVT)   Echo 1. Left ventricular ejection fraction, by estimation, is 55 to 60%. The  left ventricle has normal function. The left ventricle has no regional  wall motion abnormalities. There is moderate concentric left ventricular  hypertrophy. Left ventricular  diastolic parameters are consistent with Grade II diastolic dysfunction  (pseudonormalization). Elevated left atrial pressure.  2. Right ventricular systolic function is normal. The right ventricular  size is normal. Mildly increased right ventricular wall thickness. There  is moderately elevated pulmonary artery systolic pressure. The estimated  right ventricular systolic pressure  is 27.5 mmHg.  3. Left atrial size was severely dilated.  4. Right atrial size was severely dilated.  5. There is no evidence of cardiac tamponade.  6. The mitral valve is normal in structure. Trivial mitral valve  regurgitation. No evidence of mitral stenosis.  7. The aortic valve is normal in structure. There is mild calcification  of the aortic valve. There is mild thickening of the aortic valve. Aortic  valve regurgitation is not visualized. Mild aortic valve sclerosis is  present, with no evidence of aortic  valve stenosis.  8. The inferior vena cava is dilated in size with <50% respiratory  variability, suggesting right atrial pressure of 15 mmHg.   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    Anesthesia Other Findings   Reproductive/Obstetrics negative OB ROS                             Anesthesia Physical Anesthesia Plan  ASA: 4  Anesthesia Plan: General   Post-op Pain Management:    Induction: Intravenous  PONV Risk Score and Plan: 2 and Ondansetron, Dexamethasone, Midazolam and Treatment may vary due to age or medical condition  Airway Management Planned: Oral ETT  Additional Equipment: None  Intra-op Plan:   Post-operative Plan: Extubation in OR  Informed Consent:   Plan Discussed with:   Anesthesia Plan Comments:        Anesthesia Quick Evaluation

## 2021-10-24 NOTE — Progress Notes (Signed)
Per MD patient can travel to surgery unmonitored.

## 2021-10-24 NOTE — Progress Notes (Addendum)
Subjective:  No acute events overnight.  Patient evaluated at bedside.  Patient was tearful requesting to call brother.  Patient also was tearful because she was experiencing abdominal pain.  Patient states that her pain has not been well controlled and would like scheduled pain medication as she does not sometimes verbalize that she requires it.  Discussed with patient that we would call her brother in order to update him as well as have chaplain come speak talk with her.  Patient verbalized understanding and agreement.  Discussed with patient plan for today was to have vascular surgery come assess patient for possibility of IVC filter or thrombectomy for patient's DVT in left leg.  Also updated brother on plan.  Brother mentions how he is legal guardian.  Discussed with brother that he would need to bring in legal documentation of this in order to ensure that he has appropriate guardianship over patient.   Objective:   Vital signs in last 24 hours:       Vitals:    10/14/21 2059 10/15/21 0010 10/15/21 0409 10/15/21 0928  BP: 114/63 (!) 103/55 (!) 149/74 (!) 155/70  Pulse: 77 75 72 66  Resp: 18 16 18 18   Temp: 98.4 F (36.9 C) 98.5 F (36.9 C) (!) 97.5 F (36.4 C) 97.8 F (36.6 C)  TempSrc: Oral Oral Axillary Oral  SpO2: 100% 100% 100% 100%  Weight:          Height:            Hgb 8.5 -> 7.1 -> 10.9 s/p 1 unit blood transfusion WBC: 13.2 -> 12.4 Platelets: 377 -> 455  Constitutional: alert, appears uncomfortable HENT: normocephalic, atraumatic, mucous membranes moist Eyes: conjunctiva non-erythematous, EOMI Cardiovascular: RRR, holosystolic murmur; trace pitting edema of LLE  Pulmonary/Chest: normal work of breathing on room air, LCTAB Abdominal: soft, distended abdomen, no guarding or rigidity. 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   Acute blood loss anemia 2/2 menorragia from uterine fibroids s/p [place] Leukocytosis, resolving  S/p [place] 10/25; Hgb stable at ~8.5 over past several days, no signs of profuse bleeding and no obvious blood draining from pure wick. WBC downtrending 22.1 -> 15.0 -> 13.2, likely 2/2 postembolization syndrome; UA clean with no growth on urine Cx, CXR negative, CT abd/pelvis without concerns for infection, and BC NG at 3 days. Will withhold from starting empiric abx given no source and clinical improvement, but will start if she clinically deteriorates. Vitals stable today, BP 138/74.  - Hold Eliquis due to  anemia (Hgb 7.1) - Transfused 1 unit RBC yesterday due to anemia - Monitor CBC and transfuse if Hg < 7 - Follow blood cultures, no growth to date  - Monitor fever curve and CBC - Pain management: tylenol (scheduled), norco, toradol, dilaudid  -Start oxycodone 5 mg every 6 hours for pain - 1 month outpatient IR follow up with Dr. Laurence Ferrari  - Call OBGYN discuss options  - TOC following: d/c to [facility] once medically stable     Prior  Hx of CVA with residual left sided hemiplegia Provoked LLE DVT Lupus Anticoagulant neg on confirmatory test. Resumed Eliquis 10/27 for LE DVT. Hgb remains stable.  - Currently holding Eliquis, will eventually need ASA 81 mg qd  - Atorvastatin 40 qd - Follow up with Dr Leonie Man in stroke clinic -Vascular surgery consult for possible IVC or thrombectomy for patient's LLE DVT.  Appreciate recommendations.   Deep tissue injury of the left heel Noted to have ecchymosis of the left posterior lateral heel, no drainage, warmth, purulence -Continue with Prevalon boots -Apply foam dressing to avoid friction  HTN Stable this morning, 120/70's, no episodes of hypotension.  - Avoid overcorrection of BP given stroke hx - Continue Entresto twice daily, Clonidine 0.1mg  three times daily, Hydralazine 100mg  three times daily   Hx of CHF Remains free of signs of fluid overload and euvolemic on exam.   - Continue entresto, coreg, imdur   Hx of SVT on amiodorone  Hemodynamically stable HR controlled. - Continue amiodarone     Best Practice: Diet: Dys 3 diet  IVF: Non VTE: none  Code: Partial code     France Ravens, MD Internal Medicine Resident, PGY-1 [facility]  Pager: [phone] 11:54 AM, 10/24/2021

## 2021-10-24 NOTE — Op Note (Signed)
    [name] MRN: [id number] DOB: 1976/03/30 Sex: female  10/24/2021 Pre-operative Diagnosis: DVT Post-operative diagnosis:  Same Surgeon:  Annamarie Major Procedure Performed:  1.  Ultrasound-guided access, right common femoral vein  2.  Inferior venacavogram  3.  Placement of infrarenal inferior vena cava filter   Indications: This is a 45 year old female with left iliofemoral DVT and the inability to be anticoagulated.  She comes in today for IVC filter.  Procedure:  The patient was identified in the holding area and taken to room 8.  The patient was then placed supine on the table and prepped and draped in the usual sterile fashion.  A time out was called.  Ultrasound was used to evaluate the right common pulm vein which was widely patent and easily compressible.  1% lidocaine was used for local anesthesia.  The right common femoral vein was cannulated under ultrasound guidance with an 18-gauge needle.  An 035 wire was advanced without resistance.  A pigtail catheter was placed into the right common iliac vein and inferior venacavogram was performed.  Had difficulty definitively identifying the renal veins because of bowel gas.  I took several pictures with the catheter in different positions and ultimately ended up cannulating bilateral renal veins with a H1 catheter.  I then used a Chief of Staff.  The filter sheath was inserted over the wire.  The filter was then deployed at the mid L3 vertebral body.  The sheath was removed and manual pressure was held for hemostasis.  Vena cava diameter was less than 20 mm.  There were no vena cava abnormalities.    Impression:  #1 successful placement of infrarenal inferior vena cava filter  #2 would recommend vena cava filter removal once thrombotic issues have resolved.   Theotis Burrow, M.D., [facility] [place] Office: [phone] Pager:  [phone]

## 2021-10-24 NOTE — Consult Note (Addendum)
[facility] [place] CONSULT NOTE   MRN : [id number]  Reason for Consult: LEFT:  - Findings consistent with age indeterminate deep vein thrombosis  involving the left common femoral vein, SF junction, left femoral vein, and external iliac vein, common iliac vein.  Referring Physician: Dr. Alfonse Spruce  History of Present Illness: 45 y/o female with history of CVA with left-sided hemiplegia, SVT, CHF, chronic anemia, hypertension was brought to the ER after patient's hemoglobin was found to be 2.9 and patient also has been having continuous menorrhagia due to history of chronic anemia with uterine fibroids.    She has significant left LE edema.  Venous duplex reveals DVT left common femoral vein, SF junction, left femoral vein, and external iliac vein, common iliac vein.  Due to blood loss anemia she can not have anticoagulation.  We have been consulted for measures to prevent PE in this setting.     Current Facility-Administered Medications  Medication Dose Route Frequency Provider Last Rate Last Admin   0.9 %  sodium chloride infusion (Manually program via Guardrails IV Fluids)   Intravenous Once Gaylan Gerold, DO       0.9 %  sodium chloride infusion   Intravenous PRN Donne Hazel, MD 10 mL/hr at 10/12/21 1506 Infusion Verify at 10/12/21 1506   0.9 %  sodium chloride infusion  250 mL Intravenous PRN Criselda Peaches, MD       acetaminophen (TYLENOL) tablet 1,000 mg  1,000 mg Oral Q6H France Ravens, MD   1,000 mg at 10/24/21 0100   amiodarone (PACERONE) tablet 200 mg  200 mg Oral Daily Donne Hazel, MD   200 mg at 10/23/21 0955   atorvastatin (LIPITOR) tablet 40 mg  40 mg Oral Daily Donne Hazel, MD   40 mg at 10/23/21 5170   baclofen (LIORESAL) tablet 20 mg  20 mg Oral TID Donne Hazel, MD   20 mg at 10/23/21 2126   bisacodyl (DULCOLAX) EC tablet 10 mg  10 mg Oral Daily Atway, Rayann N, DO       bisacodyl (DULCOLAX) suppository 10 mg  10 mg Rectal Once Gaylan Gerold, DO       carvedilol (COREG) tablet 25 mg  25 mg Oral BID Donne Hazel, MD   25 mg at 10/23/21 2126   Chlorhexidine Gluconate Cloth 2 % PADS 6 each  6 each Topical Daily Donne Hazel, MD   6 each at 10/24/21 1040   cloNIDine (CATAPRES) tablet 0.1 mg  0.1 mg Oral TID Donne Hazel, MD   0.1 mg at 10/23/21 2132   feeding supplement (ENSURE ENLIVE / ENSURE PLUS) liquid 237 mL  237 mL Oral TID BM Donne Hazel, MD   237 mL at 10/24/21 1040   hydrALAZINE (APRESOLINE) injection 10 mg  10 mg Intravenous Q4H PRN Donne Hazel, MD   10 mg at 10/14/21 0174   hydrALAZINE (APRESOLINE) tablet 100 mg  100 mg Oral TID Donne Hazel, MD   100 mg at 10/23/21 2125   HYDROmorphone (DILAUDID) injection 0.5 mg  0.5 mg Intravenous Q4H PRN Gaylan Gerold, DO   0.5 mg at 10/23/21 2135   iohexol (OMNIPAQUE) 300 MG/ML solution 100 mL  100 mL Intra-arterial Once PRN Criselda Peaches, MD       isosorbide mononitrate (IMDUR) 24 hr tablet 60 mg  60 mg Oral Daily Donne Hazel, MD   60 mg at 10/23/21 0955   MEDLINE  mouth rinse  15 mL Mouth Rinse BID Donne Hazel, MD   15 mL at 10/24/21 1040   ondansetron (ZOFRAN) injection 4 mg  4 mg Intravenous Q6H PRN Criselda Peaches, MD       oxyCODONE (Oxy IR/ROXICODONE) immediate release tablet 5 mg  5 mg Oral Q6H Atway, Rayann N, DO       pantoprazole (PROTONIX) EC tablet 40 mg  40 mg Oral Daily Iona Beard, MD   40 mg at 10/23/21 0953   polyethylene glycol (MIRALAX / GLYCOLAX) packet 17 g  17 g Oral Daily Orvis Brill, MD   17 g at 10/24/21 1041   promethazine (PHENERGAN) tablet 25 mg  25 mg Oral Q8H PRN Criselda Peaches, MD       Or   promethazine (PHENERGAN) suppository 25 mg  25 mg Rectal Q8H PRN Criselda Peaches, MD       sacubitril-valsartan (ENTRESTO) 97-103 mg per tablet  1 tablet Oral BID Donne Hazel, MD   1 tablet at 10/23/21 2126   senna-docusate (Senokot-S) tablet 2 tablet  2 tablet Oral BID Rehman, Areeg N, DO       sodium  chloride flush (NS) 0.9 % injection 3 mL  3 mL Intravenous Q12H Criselda Peaches, MD   3 mL at 10/23/21 2131   sodium chloride flush (NS) 0.9 % injection 3 mL  3 mL Intravenous PRN Criselda Peaches, MD        Pt meds include: Statin :Yes Betablocker: Yes ASA: Yes Other anticoagulants/antiplatelets: none  Past Medical History:  Diagnosis Date   CHF (congestive heart failure) (HCC)    Chronic blood loss anemia    Related to uterine fibroids   Essential hypertension    Poorly controlled, repeat by report only on nicardipine 90 mg   Fibroid    Likely complicated by by menometrorrhagia and chronic anemia   Hypertension    Paroxysmal SVT (supraventricular tachycardia) (HCC)    Frequent episodes, can last anywhere from 20 minutes to 12-15 hours.  Not on beta-blocker or calcium channel blocker   Uterine fibroid     Past Surgical History:  Procedure Laterality Date   IR ANGIOGRAM FOLLOW UP STUDY  05/15/2021   IR AORTAGRAM ABDOMINAL SERIALOGRAM  10/16/2021   IR CT HEAD LTD  05/15/2021   IR EMBO ARTERIAL NOT HEMORR HEMANG INC GUIDE ROADMAPPING  10/16/2021   IR PERCUTANEOUS ART THROMBECTOMY/INFUSION INTRACRANIAL INC DIAG ANGIO  05/15/2021       IR PERCUTANEOUS ART THROMBECTOMY/INFUSION INTRACRANIAL INC DIAG ANGIO  05/15/2021   IR TRANSCATH/EMBOLIZ  05/15/2021   IR US GUIDE VASC ACCESS RIGHT  10/16/2021   RADIOLOGY WITH ANESTHESIA N/A 05/15/2021   Procedure: IR WITH ANESTHESIA;  Surgeon: Radiologist, Medication, MD;  Location: [place];  Service: Radiology;  Laterality: N/A;   Unknown      Social History Social History   Tobacco Use   Smoking status: Every Day   Smokeless tobacco: Never  Vaping Use   Vaping Use: Never used  Substance Use Topics   Alcohol use: Not Currently    Comment: Occasionally   Drug use: Not Currently    Types: Marijuana    Family History Family History  Problem Relation Age of Onset   Dementia Mother     No Known Allergies   REVIEW OF  SYSTEMS  General: [ ]  Weight loss, [ ]  Fever, [ ]  chills Neurologic: [ ]  Dizziness, [ ]  Blackouts, [ ]  Seizure [  x] Stroke, [ ]  "Mini stroke", [ ]  Slurred speech, [ ]  Temporary blindness; [ ]  weakness in arms or legs, [ ]  Hoarseness [ ]  Dysphagia Cardiac: [ ]  Chest pain/pressure, [ ]  Shortness of breath at rest [ ]  Shortness of breath with exertion, [ ]  Atrial fibrillation or irregular heartbeat  Vascular: [ ]  Pain in legs with walking, [ ]  Pain in legs at rest, [ ]  Pain in legs at night,  [ ]  Non-healing ulcer, [ x] Blood clot in vein/DVT,   Pulmonary: [ ]  Home oxygen, [ ]  Productive cough, [ ]  Coughing up blood, [ ]  Asthma,  [ ]  Wheezing [ ]  COPD Musculoskeletal:  [ ]  Arthritis, [ ]  Low back pain, [ ]  Joint pain Hematologic: [ ]  Easy Bruising, [ x] Anemia; [ ]  Hepatitis Gastrointestinal: [ ]  Blood in stool, [ ]  Gastroesophageal Reflux/heartburn, Urinary: [ ]  chronic Kidney disease, [ ]  on HD - [ ]  MWF or [ ]  TTHS, [ ]  Burning with urination, [ ]  Difficulty urinating Skin: [ ]  Rashes, [ ]  Wounds Psychological: [ ]  Anxiety, [ ]  Depression  Physical Examination Vitals:   10/24/21 0215 10/24/21 0440 10/24/21 0820 10/24/21 1038  BP: (!) 162/81 (!) 166/89 (!) 176/86 (!) 146/78  Pulse: 73 80 84 94  Resp: 18 18 19    Temp: 98.9 F (37.2 C) 98.7 F (37.1 C) 98.7 F (37.1 C) 99.1 F (37.3 C)  TempSrc: Oral Oral Oral Oral  SpO2: 100% 100% 100%   Weight:      Height:       Body mass index is 21.09 kg/m.  General:  WDWN in NAD Gait: non ambulatory hemiplegia left side HENT: WNL Eyes: Pupils equal Pulmonary: normal non-labored breathing , without Rales, rhonchi,  wheezing Cardiac: RRR, without  Murmurs, rubs or gallops; No carotid bruits Abdomen: distended, NT, no masses Skin: no rashes, ulcers noted;  no Gangrene , no cellulitis; no open wounds;   Vascular Exam/Pulses:radial, femoral pulses palpable   Musculoskeletal: no muscle wasting or atrophy; no edema  Neurologic: A&O X 3;  Appropriate Affect ;  SENSATION: normal; MOTOR FUNCTION: left hemiplegia Speech is fluent/normal   Significant Diagnostic Studies: CBC Lab Results  Component Value Date   WBC 12.4 (H) 10/24/2021   HGB 10.9 (L) 10/24/2021   HCT 34.2 (L) 10/24/2021   MCV 82.6 10/24/2021   PLT 455 (H) 10/24/2021    BMET    Component Value Date/Time   NA 136 10/24/2021 0714   NA 138 03/01/2021 1417   K 5.2 (H) 10/24/2021 0714   CL 108 10/24/2021 0714   CO2 20 (L) 10/24/2021 0714   GLUCOSE 86 10/24/2021 0714   BUN 19 10/24/2021 0714   BUN 26 (H) 03/01/2021 1417   CREATININE 0.68 10/24/2021 0714   CALCIUM 10.2 10/24/2021 0714   GFRNONAA >60 10/24/2021 0714   GFRAA >60 05/03/2020 0350   Estimated Creatinine Clearance: 78.2 mL/min (by C-G formula based on SCr of 0.68 mg/dL).  COAG Lab Results  Component Value Date   INR 1.2 10/16/2021   INR 1.3 (H) 10/10/2021   INR 1.7 (H) 10/10/2021     Non-Invasive Vascular Imaging:  +---------+---------------+---------+-----------+----------+--------------+   RIGHT    CompressibilityPhasicitySpontaneityPropertiesThrombus  Aging  +---------+---------------+---------+-----------+----------+--------------+   CFV      Full           Yes      Yes                                    +---------+---------------+---------+-----------+----------+--------------+  SFJ      Full                                                           +---------+---------------+---------+-----------+----------+--------------+   FV Prox  Full                                                           +---------+---------------+---------+-----------+----------+--------------+   FV Mid   Full                                                           +---------+---------------+---------+-----------+----------+--------------+   FV DistalFull                                                            +---------+---------------+---------+-----------+----------+--------------+   PFV      Full                                                           +---------+---------------+---------+-----------+----------+--------------+   POP      Full           Yes      Yes                                    +---------+---------------+---------+-----------+----------+--------------+   PTV      Full                                                           +---------+---------------+---------+-----------+----------+--------------+   PERO     Full                                                           +---------+---------------+---------+-----------+----------+--------------+            +---------+---------------+---------+-----------+----------+---------------  --+  LEFT     CompressibilityPhasicitySpontaneityPropertiesThrombus Aging      +---------+---------------+---------+-----------+----------+---------------  --+  CFV      Partial        Yes      Yes                  Age  Indeterminate  +---------+---------------+---------+-----------+----------+---------------  --+  SFJ      Full                                                             +---------+---------------+---------+-----------+----------+---------------  --+  FV Prox  None                                         Age  Indeterminate  +---------+---------------+---------+-----------+----------+---------------  --+  FV Mid   Partial                                      Age  Indeterminate  +---------+---------------+---------+-----------+----------+---------------  --+  FV DistalFull                                                             +---------+---------------+---------+-----------+----------+---------------  --+  PFV      Full                                                              +---------+---------------+---------+-----------+----------+---------------  --+  POP      Full           Yes      Yes                                      +---------+---------------+---------+-----------+----------+---------------  --+  PTV      Full                                                             +---------+---------------+---------+-----------+----------+---------------  --+  PERO     Full                                                             +---------+---------------+---------+-----------+----------+---------------  --+      Summary:  RIGHT:  - There is no evidence of deep vein thrombosis in the lower extremity.     - No cystic structure found in the popliteal fossa.     LEFT:  - Findings consistent with age indeterminate deep vein thrombosis  involving the left common femoral vein, and left femoral vein.  - No cystic structure found in the popliteal fossa.      ASSESSMENT/PLAN:  Left Hemiplegia s/p right hemorraghic CVA  with acute blood loss anemia on chronic anemia. Left LE DVT including common iliac, SFJ down to the left femoral vein. We will plan IVC filter placement to prevent large PE development.   NPO, consent signed by Dr. Trula Slade at bedside and he spoke with family. Anemia corrected with PRBC x 5 and 1 unit of FFP HGB 10.9.      Roxy Horseman 10/24/2021 10:43 AM  I agree with the above.  I have seen and evaluated the patient.  We were consulted because she had a ultrasound that showed a left leg DVT.  She cannot be anticoagulated because of recent bleeding with a hemoglobin down to less than 3.  I discussed with the patient, her family and brother (her power of attorney) that we should proceed with placement of a inferior vena cava filter.  This is a permanent filter with the option to be removed.  I told her that I would like to remove it once she stabilizes, within the next 3 to 6 months.  All questions were  answered.  This will be performed later today.  Annamarie Major

## 2021-10-25 ENCOUNTER — Encounter (HOSPITAL_COMMUNITY): Payer: Self-pay | Admitting: Surgery

## 2021-10-25 DIAGNOSIS — I82422 Acute embolism and thrombosis of left iliac vein: Secondary | ICD-10-CM | POA: Diagnosis not present

## 2021-10-25 DIAGNOSIS — D259 Leiomyoma of uterus, unspecified: Secondary | ICD-10-CM | POA: Diagnosis not present

## 2021-10-25 DIAGNOSIS — D62 Acute posthemorrhagic anemia: Secondary | ICD-10-CM | POA: Diagnosis not present

## 2021-10-25 LAB — BASIC METABOLIC PANEL
Anion gap: 7 (ref 5–15)
BUN: 16 mg/dL (ref 6–20)
CO2: 21 mmol/L — ABNORMAL LOW (ref 22–32)
Calcium: 9.9 mg/dL (ref 8.9–10.3)
Chloride: 108 mmol/L (ref 98–111)
Creatinine, Ser: 0.65 mg/dL (ref 0.44–1.00)
GFR, Estimated: >60 mL/min (ref >60)
Glucose, Bld: 94 mg/dL (ref 70–99)
Potassium: 4.4 mmol/L (ref 3.5–5.1)
Sodium: 136 mmol/L (ref 135–145)

## 2021-10-25 LAB — CBC
HCT: 28 % — ABNORMAL LOW (ref 36.0–46.0)
Hemoglobin: 8.6 g/dL — ABNORMAL LOW (ref 12.0–15.0)
MCH: 25.4 pg — ABNORMAL LOW (ref 26.0–34.0)
MCHC: 30.7 g/dL (ref 30.0–36.0)
MCV: 82.6 fL (ref 80.0–100.0)
Platelets: 470 10*3/uL — ABNORMAL HIGH (ref 150–400)
RBC: 3.39 MIL/uL — ABNORMAL LOW (ref 3.87–5.11)
RDW: 21.6 % — ABNORMAL HIGH (ref 11.5–15.5)
WBC: 12.1 10*3/uL — ABNORMAL HIGH (ref 4.0–10.5)
nRBC: 0 % (ref 0.0–0.2)

## 2021-10-25 NOTE — Progress Notes (Signed)
Subjective:  No acute events overnight.  Patient evaluated at bedside.  Patient states that he wants she wants to go home today but does not like [employer].  Patient still endorsing mild abdominal pain that is not being helped with pain meds.  Patient having regular bowel movements.  Patient still having light vaginal bleeding but no clots.  Discussed plan with patient and updated brother with plan as well.  All questions answered.  Discussed possible discharge tomorrow.  Patient verbalized agreement.   Objective:   Vital signs in last 24 hours:       Vitals:    10/14/21 2059 10/15/21 0010 10/15/21 0409 10/15/21 0928  BP: 114/63 (!) 103/55 (!) 149/74 (!) 155/70  Pulse: 77 75 72 66  Resp: 18 16 18 18   Temp: 98.4 F (36.9 C) 98.5 F (36.9 C) (!) 97.5 F (36.4 C) 97.8 F (36.6 C)  TempSrc: Oral Oral Axillary Oral  SpO2: 100% 100% 100% 100%  Weight:          Height:            Hgb 10.9 -> 8.6 WBC: 12.4 -> 12.1 Platelets: 455 -> 470  Constitutional: alert, appears uncomfortable HENT: normocephalic, atraumatic, mucous membranes moist Eyes: conjunctiva non-erythematous, EOMI Cardiovascular: RRR, holosystolic murmur; trace pitting edema of LLE  Pulmonary/Chest: normal work of breathing on room air, LCTAB Abdominal: soft, distended abdomen, no guarding or rigidity. Mild suprapubic tenderness MSK: normal bulk and tone, diffuse tenderness of the left lower extremity Neurological: A&O x 3, left-sided hemiplegia with left arm contracted Skin: Small area of ecchymosis the posterior lateral left heel, right heel appears normal Urinary: Dark red urine in canister from purewick Psych: anxious, normal affect   Assessment/Plan:   Principal Problem:   Acute blood loss anemia Active Problems:   Menorrhagia   Uncontrolled hypertension   Hemorrhagic stroke [facility])   Uterine fibroid   Left hemiparesis (HCC)   Lupus anticoagulant positive   Deep vein thrombosis (DVT) of iliac vein of  left lower extremity (HCC)   Pressure injury of skin    45 y.o. 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   Acute blood loss anemia 2/2 menorragia from uterine fibroids s/p [place] Leukocytosis, resolving  S/p [place] 10/25; Hgb stable at ~8.5 over past several days, no signs of profuse bleeding and no obvious blood draining from pure wick. WBC downtrending 22.1 -> 15.0 -> 13.2, likely 2/2 postembolization syndrome; UA clean with no growth on urine Cx, CXR negative, CT abd/pelvis without concerns for infection, and BC NG at 3 days. Will withhold from starting empiric abx given no source and clinical improvement, but will start if she clinically deteriorates. Vitals stable today, BP 155/70. Patient appears medically stable, plan for discharge back to [facility].  - Hold Eliquis due to anemia - Monitor CBC and transfuse if Hg < 7 - Follow blood cultures, no growth to date  - Monitor fever curve and CBC - Pain management: tylenol (scheduled), norco, toradol, dilaudid  -Start oxycodone 5 mg every 6 hours for pain - 1 month outpatient IR follow up with Dr. Laurence Ferrari - TOC following: d/c to [facility], [facility], [facility] once medically stable  Prior Hx of CVA with residual left sided hemiplegia Provoked LLE DVT s/p IVC filter Lupus Anticoagulant neg on confirmatory test. Resumed Eliquis 10/27 for LE DVT. Hgb remains stable.  - Continue to hold Eliquis, will eventually need ASA 81 mg qd  - Atorvastatin 40 qd - Follow up with Dr Leonie Man in stroke clinic -Vascular surgery placed IVC filter. Will request Vascular surgery reassess patient in 3 months with regards to DVT, anticoagulation, and IVC filter  removal.    Deep tissue injury of the left heel Noted to have ecchymosis of the left posterior lateral heel, no drainage, warmth, purulence -Continue with Prevalon boots -Apply foam dressing to avoid friction  HTN Stable this morning, 120/70's, no episodes of hypotension.  - Avoid overcorrection of BP given stroke hx - Continue Entresto twice daily, Clonidine 0.1mg  three times daily, Hydralazine 100mg  three times daily   Hx of CHF Remains free of signs of fluid overload and euvolemic on exam.   - Continue entresto, coreg, imdur   Hx of SVT on amiodorone  Hemodynamically stable HR controlled. - Continue amiodarone     Best Practice: Diet: Dys 3 diet  IVF: Non VTE: none  Code: Partial code     France Ravens, MD Internal Medicine Resident, PGY-1 [facility]  Pager: [phone] 6:58 AM, 10/25/2021

## 2021-10-25 NOTE — Progress Notes (Signed)
[facility]  Assessment/Planning: left iliofemoral DVT and the inability to be anticoagulated.  POD # IVC filter placed  Plan for filter removal once anticoagulation can be safely to restarted  Subjective  - No change in exam, no new complaints post procedure   Objective (!) 152/93 82 98.9 F (37.2 C) (Oral) 16 98%  Intake/Output Summary (Last 24 hours) at 10/25/2021 9833 Last data filed at 10/25/2021 0500 Gross per 24 hour  Intake 407 ml  Output 200 ml  Net 207 ml    Right groin soft, dressing clean and dry Left LE edema without phlegmasia Skin warm  no Gangrene , no cellulitis; no open wounds, abcent motor left LE    Holly Bradley 10/25/2021 7:22 AM --  Laboratory Lab Results: Recent Labs    10/24/21 0714 10/25/21 0629  WBC 12.4* 12.1*  HGB 10.9* 8.6*  HCT 34.2* 28.0*  PLT 455* 470*   BMET Recent Labs    10/22/21 0831 10/24/21 0714  NA 135 136  K 5.0 5.2*  CL 108 108  CO2 21* 20*  GLUCOSE 112* 86  BUN 29* 19  CREATININE 0.87 0.68  CALCIUM 9.8 10.2    COAG Lab Results  Component Value Date   INR 1.2 10/16/2021   INR 1.3 (H) 10/10/2021   INR 1.7 (H) 10/10/2021   No results found for: PTT

## 2021-10-25 NOTE — Plan of Care (Signed)
  Problem: Education: Goal: Knowledge of General Education information will improve Description: Including pain rating scale, medication(s)/side effects and non-pharmacologic comfort measures Outcome: Progressing   Problem: Clinical Measurements: Goal: Respiratory complications will improve Outcome: Progressing Goal: Cardiovascular complication will be avoided Outcome: Progressing   Problem: Nutrition: Goal: Adequate nutrition will be maintained Outcome: Progressing   Problem: Coping: Goal: Level of anxiety will decrease Outcome: Progressing   

## 2021-10-25 NOTE — Progress Notes (Signed)
This chaplain is present with the Pt. for F/U spiritual care. The Pt. is awake and enjoying a piece of cake, one of the Pt. favorite foods.  The Pt. is celebrating and telling stories about yesterday's visitors.  Prayers are shared at the bedside along with plans for another visit.  Chaplain Sallyanne Kuster [phone]

## 2021-10-26 LAB — BASIC METABOLIC PANEL
Anion gap: 9 (ref 5–15)
BUN: 14 mg/dL (ref 6–20)
CO2: 18 mmol/L — ABNORMAL LOW (ref 22–32)
Calcium: 9.4 mg/dL (ref 8.9–10.3)
Chloride: 109 mmol/L (ref 98–111)
Creatinine, Ser: 0.67 mg/dL (ref 0.44–1.00)
GFR, Estimated: >60 mL/min (ref >60)
Glucose, Bld: 82 mg/dL (ref 70–99)
Potassium: 6 mmol/L — ABNORMAL HIGH (ref 3.5–5.1)
Sodium: 136 mmol/L (ref 135–145)

## 2021-10-26 LAB — CBC
HCT: 27.2 % — ABNORMAL LOW (ref 36.0–46.0)
Hemoglobin: 8.9 g/dL — ABNORMAL LOW (ref 12.0–15.0)
MCH: 26.8 pg (ref 26.0–34.0)
MCHC: 32.7 g/dL (ref 30.0–36.0)
MCV: 81.9 fL (ref 80.0–100.0)
Platelets: 424 10*3/uL — ABNORMAL HIGH (ref 150–400)
RBC: 3.32 MIL/uL — ABNORMAL LOW (ref 3.87–5.11)
RDW: 21.6 % — ABNORMAL HIGH (ref 11.5–15.5)
WBC: 11.9 10*3/uL — ABNORMAL HIGH (ref 4.0–10.5)
nRBC: 0 % (ref 0.0–0.2)

## 2021-10-26 LAB — RESP PANEL BY RT-PCR (FLU A&B, COVID) ARPGX2
Influenza A by PCR: NEGATIVE
Influenza B by PCR: NEGATIVE
SARS Coronavirus 2 by RT PCR: NEGATIVE

## 2021-10-26 LAB — POTASSIUM: Potassium: 4.5 mmol/L (ref 3.5–5.1)

## 2021-10-26 MED ORDER — CALCIUM GLUCONATE 10 % IV SOLN: 1.0000 g | Freq: Once | INTRAVENOUS | Status: DC

## 2021-10-26 MED ORDER — OXYCODONE HCL 5 MG PO TABS
10.0000 mg | ORAL_TABLET | Freq: Four times a day (QID) | ORAL | Status: DC
  Administered 2021-10-26 – 2021-10-27 (×5): 10 mg via ORAL
  Filled 2021-10-26 (×5): qty 2

## 2021-10-26 MED ORDER — HYDROMORPHONE HCL 1 MG/ML IJ SOLN
1.0000 mg | Freq: Four times a day (QID) | INTRAMUSCULAR | Status: DC | PRN
  Administered 2021-10-26: 0.5 mg via INTRAVENOUS
  Filled 2021-10-26: qty 1

## 2021-10-26 MED ORDER — SACUBITRIL-VALSARTAN 97-103 MG PO TABS
1.0000 | ORAL_TABLET | Freq: Two times a day (BID) | ORAL | Status: DC
  Administered 2021-10-26 – 2021-10-27 (×3): 1 via ORAL
  Filled 2021-10-26 (×3): qty 1

## 2021-10-26 MED ORDER — HYDRALAZINE HCL 50 MG PO TABS
100.0000 mg | ORAL_TABLET | Freq: Three times a day (TID) | ORAL | Status: DC
  Administered 2021-10-26 – 2021-10-27 (×2): 100 mg via ORAL
  Filled 2021-10-26 (×2): qty 2

## 2021-10-26 NOTE — Discharge Summary (Addendum)
Name: Holly Bradley MRN: [id number] DOB: [birth year] 45 y.o. PCP: Mitzi Hansen, MD  Date of Admission: 10/09/2021  9:29 PM Date of Discharge: 10/27/2021 11:55 AM Attending Physician: Velna Ochs, MD  Discharge Diagnosis: 1. Menorrhagia 2. Uterine Fibroids 3. 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 Provoked Left Lower Extremity DVT 5. Deep Tissue injury of Left Heel 6. Hypertension 7. History of CHF 8. History of SVT  Discharge Medications: Allergies as of 10/27/2021   No Known Allergies      Medication List     STOP taking these medications    apixaban 5 MG Tabs tablet Commonly known as: ELIQUIS   ARIPiprazole 2 MG tablet Commonly known as: ABILIFY   gabapentin 100 MG capsule Commonly known as: NEURONTIN   gabapentin 300 MG capsule Commonly known as: NEURONTIN   megestrol 40 MG tablet Commonly known as: MEGACE   traMADol 50 MG tablet Commonly known as: ULTRAM       TAKE these medications    acetaminophen 500 MG tablet Commonly known as: TYLENOL Take 2 tablets (1,000 mg total) by mouth every 6 (six) hours. What changed:  medication strength how much to take how to take this when to take this reasons to take this   amiodarone 200 MG tablet Commonly known as: PACERONE Take 200 mg by mouth daily.   aspirin 81 MG chewable tablet Chew 1 tablet (81 mg total) by mouth daily.   atorvastatin 40 MG tablet Commonly known as: LIPITOR Take 1 tablet (40 mg total) by mouth daily.   baclofen 20 MG tablet Commonly known as: LIORESAL Take 1 tablet (20 mg total) by mouth 3 (three) times daily. What changed: when to take this   bisacodyl 5 MG EC tablet Commonly known as: DULCOLAX Take 2 tablets (10 mg total) by mouth daily.   carvedilol 25 MG tablet Commonly known as: COREG Take 1 tablet (25 mg total) by mouth 2 (two) times daily with a meal.   cloNIDine 0.1 MG tablet Commonly known as: CATAPRES Take 1 tablet (0.1  mg total) by mouth 3 (three) times daily.   Entresto 97-103 MG Generic drug: sacubitril-valsartan Take 1 tablet by mouth 2 (two) times daily. What changed: Another medication with the same name was removed. Continue taking this medication, and follow the directions you see here.   escitalopram 10 MG tablet Commonly known as: LEXAPRO Take 15 mg by mouth daily.   feeding supplement (PRO-STAT SUGAR FREE 64) Liqd Take 30 mLs by mouth 2 (two) times daily.   feeding supplement Liqd Take 237 mLs by mouth 3 (three) times daily between meals.   ferrous sulfate 325 (65 FE) MG tablet Take 1 tablet (325 mg total) by mouth 2 (two) times daily with a meal.   hydrALAZINE 100 MG tablet Commonly known as: APRESOLINE Take 1 tablet (100 mg total) by mouth 3 (three) times daily.   isosorbide mononitrate 60 MG 24 hr tablet Commonly known as: IMDUR Take 60 mg by mouth daily.   lactulose 10 GM/15ML solution Commonly known as: CHRONULAC Take 15 mLs (10 g total) by mouth 2 (two) times daily.   linaclotide 145 MCG Caps capsule Commonly known as: LINZESS Take 145 mcg by mouth daily before breakfast.   multivitamin with minerals Tabs tablet Take 1 tablet by mouth daily.   Oxycodone HCl 10 MG Tabs Take 1 tablet (10 mg total) by mouth every 6 (six) hours for 5 days. What changed:  medication strength how much to take when  to take this reasons to take this   pantoprazole 40 MG tablet Commonly known as: PROTONIX Take 40 mg by mouth daily.   polyethylene glycol 17 g packet Commonly known as: MIRALAX / GLYCOLAX Take 17 g by mouth daily.   senna-docusate 8.6-50 MG tablet Commonly known as: Senokot-S Take 2 tablets by mouth 2 (two) times daily.   simethicone 40 MG/0.6ML drops Commonly known as: MYLICON Take 0.6 mLs (40 mg total) by mouth every 6 (six) hours as needed for flatulence.   torsemide 20 MG tablet Commonly known as: DEMADEX TAKE 2 TABLETS (40 MG TOTAL) BY MOUTH 2 (TWO) TIMES  DAILY.               Discharge Care Instructions  (From admission, onward)           Start     Ordered   10/27/21 0000  Discharge wound care:       Comments: Please change dressing on back regularly   10/27/21 0931            Disposition and follow-up:   [name] was discharged from [facility] in Stable condition.  At the hospital follow up visit please address:  1.   Symptomatic acute blood loss anemia: Likely due to menorrhagia from uterine fibroids status post uterine artery embolization.  Patient still continues to endorse light vaginal bleeding but no blood clots.  Patient endorses significant abdominal pain and has scheduled Tylenol and oxycodone in order to manage this pain.   Provoked lower left extremity DVT: Given patient's continued vaginal bleeding, patient's Eliquis has been discontinued.  Vascular surgery has placed a IVC filter for patient.  Will need to follow-up in 3 months with vascular surgery to discuss resuming anticoagulation, removing of IVC filter, and status of DVT in left leg.  History of CVA with residual left-sided hemiplegia: Patient to follow-up with Dr. Leonie Man in stroke clinic.  Patient is to be continued on atorvastatin 40 mg daily.  Deep tissue injury of left heel: Noted to have ecchymosis of the left posterior lateral heel.  Recommend continuing Prevalon boots and apply foam dressing to avoid friction.  Hypertension Patient had elevated blood pressures approximately 160/80 closer to discharge but declined.  Patient is to continue with her Entresto twice daily, clonidine 0.1 mg total 3 times daily, hydralazine 100 mg 3 times daily.  CHF Remains free of signs of fluid overload and euvolemic during admission.  Continued on Entresto, Coreg, Imdur  SVT Hemodynamically stable heart rate controlled during admission.  Continued on amiodarone  2.  Labs / imaging needed at time of follow-up: Left extremity  ultrasound in 3 months to assess DVT  3.  Pending labs/ test needing follow-up: none  Follow-up Appointments:  Follow-up Information     Criselda Peaches, MD Follow up.   Specialties: Interventional Radiology, Radiology Why: IR scheduler will call you with appointment date/time (approximately 1 month after your procedure). Please call with questions or concerns prior to your appointment. Contact information: [address] [place] [address] [phone]         Mitzi Hansen, MD. Schedule an appointment as soon as possible for a visit in 1 week(s).   Specialty: Internal Medicine Why: please call to make an appointment with your PCP, make appt for within 1 week. PLEASE DO NOT FORGET. Contact information: [address] [place] [address] [place], [place]  S, MD. Schedule an appointment as soon as possible for a visit in 1 week(s).   Specialties: Neurology, Radiology Contact information: [address] [place] [address] [facility] Course by problem list: Acute blood loss anemia 2/2 continuous menorragia from uterine fibroids, resolved Patient was initially admitted for acute blood loss anemia due to continuous menorrhagia status post tranexamic acid and 5 units of packed red blood cell.  Patient's hemoglobin stable s/p IR [place] 10/25. Off of Megace; no signs of profuse bleeding, only small quantity of blood drained from [place]. Pt asymptomatic, vitals stable, and pain appropriately controlled. Leukocytosis was up to mid 20's 4 days after [place] consistent with postembolization syndrome but has since downtrended to 8.7, with no source of infection. UA clean with no growth on urine Cx, CXR negative, CT abd/pelvis without concerns for infection, and BC NG at 3 days.  Withheld from starting empiric abx given no source and clinical improvement.  Patient's Megace was stopped and was  monitored for acute blood loss.  Patient initially had stable hemoglobin and started on Eliquis but this was held once patient's hemoglobin dropped to 7.1.  Patient received 1 unit of packed red blood cells and has had stable hemoglobin around 8.5.  Provoked Left Lower Extremity DVT Patient was initially resumed on Eliquis 10/27 for lower extremity DVT.  Ultrasound confirmed an age indeterminate left lower extremity DVT including common iliac vein, SFJ down to the left femoral vein.  Vascular surgery placed IVC filter to prevent large PE development on 10/24/2021.  HTN Patient's SBP has gone up to 190's, but improved to 140's prior to d/c. Patient's blood pressure meds titrated as tolerated with goal of normotension.    Hx of CHF Tolerated 6 U pRBC's transfusion without signs of fluid overload and euvolemic throughout admission.     Hx of SVT on amiodorone  Patient continued on amiodarone for SVT.  Discharge Exam:   BP (!) 175/102 (BP Location: Right Arm)   Pulse 96   Temp 98.8 F (37.1 C) (Oral)   Resp 18   Ht 5\' 5"  (9.798 m)   Wt 63.9 kg   LMP  (LMP Unknown)   SpO2 97%   BMI 23.44 kg/m  Discharge exam:  Constitutional: alert, appears uncomfortable HENT: normocephalic, atraumatic, mucous membranes moist Eyes: conjunctiva non-erythematous, EOMI Cardiovascular: RRR, holosystolic murmur; trace pitting edema of LLE  Pulmonary/Chest: normal work of breathing on room air, LCTAB Abdominal: soft, distended abdomen, no guarding or rigidity. Mild suprapubic tenderness MSK: normal bulk and tone, diffuse tenderness of the left lower extremity Neurological: A&O x 3, left-sided hemiplegia with left arm contracted Skin: Small area of ecchymosis the posterior lateral left heel, right heel appears normal Urinary: Dark red urine in canister from purewick Psych: anxious, normal affect  Pertinent Labs, Studies, and Procedures:  CBC    Component Value Date/Time   WBC 12.6 (H) 10/27/2021 0524    RBC 3.61 (L) 10/27/2021 0524   HGB 9.2 (L) 10/27/2021 0524   HCT 30.1 (L) 10/27/2021 0524   PLT 507 (H) 10/27/2021 0524   MCV 83.4 10/27/2021 0524   MCH 25.5 (L) 10/27/2021 0524   MCHC 30.6 10/27/2021 0524   RDW 22.0 (H) 10/27/2021 0524   LYMPHSABS 0.6 (L) 10/24/2021 0714   MONOABS 0.8 10/24/2021 0714   EOSABS 0.0 10/24/2021 0714   BASOSABS 0.0 10/24/2021 0714   CMP Latest Ref  Rng & Units 10/27/2021 10/26/2021 10/26/2021  Glucose 70 - 99 mg/dL 85 - 82  BUN 6 - 20 mg/dL 9 - 14  Creatinine 0.44 - 1.00 mg/dL 0.58 - 0.67  Sodium 135 - 145 mmol/L 137 - 136  Potassium 3.5 - 5.1 mmol/L 4.4 4.5 6.0(H)  Chloride 98 - 111 mmol/L 110 - 109  CO2 22 - 32 mmol/L 21(L) - 18(L)  Calcium 8.9 - 10.3 mg/dL 9.8 - 9.4  Total Protein 6.5 - 8.1 g/dL - - -  Total Bilirubin 0.3 - 1.2 mg/dL - - -  Alkaline Phos 38 - 126 U/L - - -  AST 15 - 41 U/L - - -  ALT 0 - 44 U/L - - -  DG Chest 1 View  Result Date: 10/19/2021 CLINICAL DATA:  Leukocytosis EXAM: CHEST  1 VIEW COMPARISON:  10/09/2021 FINDINGS: Lungs are well expanded, symmetric, and clear. No pneumothorax or pleural effusion. Cardiac size is mildly enlarged, unchanged. Pulmonary vascularity is normal. Osseous structures are age-appropriate. No acute bone abnormality. IMPRESSION: No active disease.  Stable cardiomegaly. Electronically Signed   By: Fidela Salisbury M.D.   On: 10/19/2021 21:05   DG Abd 1 View  Result Date: 10/10/2021 CLINICAL DATA:  Abdominal distension EXAM: ABDOMEN - 1 VIEW COMPARISON:  None. FINDINGS: There is no evidence of bowel obstruction. Mass effect from known pelvic mass. There are no radiopaque calculi overlying the kidneys or course of the ureters. No acute osseous abnormality. IMPRESSION: No evidence of bowel obstruction. Electronically Signed   By: Maurine Simmering M.D.   On: 10/10/2021 14:31   US PELVIS (TRANSABDOMINAL ONLY)  Result Date: 10/10/2021 CLINICAL DATA:  Pelvic pain, bleeding, uterine fibroids EXAM: TRANSABDOMINAL  ULTRASOUND OF PELVIS TECHNIQUE: Transabdominal ultrasound examination of the pelvis was performed including evaluation of the uterus, ovaries, adnexal regions, and pelvic cul-de-sac. COMPARISON:  CT abdomen/pelvis dated 01/11/2021 FINDINGS: Uterus Measurements: 24.7 x 13.7 x 22.5 cm = volume: 3982 mL. Multiple uterine fibroids, including: --6.5 x 4.9 x 5.2 cm intramural fibroid in the right posterior uterine body --6.2 x 6.3 x 5.1 cm intramural fibroid in the right posterior uterine fundus --4.1 x 3.1 x 4.4 cm subserosal fibroid in the left anterior uterine body Endometrium Thickness: 27 mm. Heterogeneous thickening with complex soft tissue/fluid in the endometrial cavity. Right ovary Not discretely visualized.  No adnexal mass is seen. Left ovary Not discretely visualized.  No adnexal mass is seen. Other findings:  No abnormal free fluid. IMPRESSION: Enlarged uterus with multiple uterine fibroids, measuring up to 6.5 cm, as above. Heterogeneous, thickened endometrium, measuring 2.7 cm. Endometrial sampling is suggested. Electronically Signed   By: Julian Hy M.D.   On: 10/10/2021 02:54   CT ABDOMEN PELVIS W CONTRAST  Result Date: 10/21/2021 CLINICAL DATA:  45 year old female with abdominal and pelvic pain. Recent uterine artery embolization. EXAM: CT ABDOMEN AND PELVIS WITH CONTRAST TECHNIQUE: Multidetector CT imaging of the abdomen and pelvis was performed using the standard protocol following bolus administration of intravenous contrast. CONTRAST:  169mL OMNIPAQUE IOHEXOL 300 MG/ML  SOLN COMPARISON:  10/12/2021 CT. 10/16/2021 bilateral uterine artery and LEFT ovarian artery embolization. FINDINGS: Lower chest: Marked cardiomegaly again noted. No acute abnormalities. Hepatobiliary: The liver and gallbladder are unremarkable except tiny probable hepatic cysts. Pancreas: Unremarkable Spleen: Unremarkable Adrenals/Urinary Tract: Unchanged nonobstructing punctate calculi within the mid-LOWER RIGHT kidney  noted. Scattered areas of LEFT renal scarring are again identified. The adrenal glands and bladder are unremarkable. Stomach/Bowel: There is no evidence of bowel  obstruction, definite bowel wall thickening or inflammatory changes. Vascular/Lymphatic: Aortic atherosclerosis. No enlarged abdominal or pelvic lymph nodes. Reproductive: Massively enlarged uterus containing multiple fibroids are again noted. Significant decrease in uterine enhancement noted following embolization. A few tiny foci of gas within the LOWER uterine fibroids are noted, not unexpected following embolization. Other: No ascites, focal collection or pneumoperitoneum. Diffuse subcutaneous edema is again noted Musculoskeletal: No acute or suspicious bony abnormalities are noted. IMPRESSION: 1. Massively enlarged uterus containing multiple fibroids again noted with decreased uterine enhancement following uterine embolization. Small foci of gas within LOWER uterine fibroids are not unexpected following embolization. Follow-up clinically. 2. Marked cardiomegaly and diffuse subcutaneous edema again noted. 3. Nonobstructing RIGHT renal calculi. 4. Aortic Atherosclerosis (ICD10-I70.0). Electronically Signed   By: Margarette Canada M.D.   On: 10/21/2021 16:09   IR Aortagram Abdominal Serialogram  Result Date: 10/16/2021 INDICATION: 45 year old female with hypertrophic multi fibroid uterus with resultant menorrhagia and symptomatic anemia. She requires Eliquis due to recent prior stroke with left-sided hemiplegia and DVT. She presents for uterine artery embolization. Additionally, due to the size of the uterus, there is presumed parasitized flow from the ovarian arteries as well. This will be assessed at the time of angiography. EXAM: IR EMBO ARTERIAL NOT HEMORR HEMANG INC GUIDE ROADMAPPING; IR ULTRASOUND GUIDANCE VASC ACCESS RIGHT; AORTOGRAPHY MEDICATIONS: 2 g Rocephin, 20 mg hydralazine, 60 mg Toradol (IM). The antibiotic was administered within 1 hour of  the procedure ANESTHESIA/SEDATION: Moderate (conscious) sedation was employed during this procedure. A total of Versed 3 mg and Fentanyl 150 mcg was administered intravenously. Moderate Sedation Time: 111 minutes. The patient's level of consciousness and vital signs were monitored continuously by radiology nursing throughout the procedure under my direct supervision. CONTRAST:  82mL OMNIPAQUE IOHEXOL 300 MG/ML  SOLN FLUOROSCOPY TIME:  Fluoroscopy Time: 37 minutes 24 seconds (566 mGy). COMPLICATIONS: None immediate. PROCEDURE: Informed consent was obtained from the patient following explanation of the procedure, risks, benefits and alternatives. The patient understands, agrees and consents for the procedure. All questions were addressed. A time out was performed prior to the initiation of the procedure. Maximal barrier sterile technique utilized including caps, mask, sterile gowns, sterile gloves, large sterile drape, hand hygiene, and Betadine prep. The right common femoral artery was interrogated with ultrasound and found to be widely patent. An image was obtained and stored for the permanent electronic medical record. Local anesthesia was attained by infiltration with 1% lidocaine. A small dermatotomy was made. Under real-time sonographic guidance, the vessel was punctured with a 21 gauge micropuncture needle. Using standard technique, the initial micro needle was exchanged over a 0.018 micro wire for a transitional 4 [place] micro sheath. The micro sheath was then exchanged over a 0.035 wire for a 5 French vascular sheath. A C2 cobra catheter was advanced up in over the aortic bifurcation and into the left internal iliac artery. Arteriography was performed demonstrating a markedly hypertrophic uterine artery. A renegade hi Flo microcatheter was advanced into the uterine artery and further into the horizontal segment of the uterine artery. Additional arteriography was performed confirming catheter tip location.  There is preferential flow into the uterus and associated fibroids. No vesico vaginal or other proximal branches identified. Particle embolization was then performed using 1 vial of 500-700 micron embospheres and 4 vials of 700-900 micron embospheres. Embolization was taken to near stasis. Post embolization arteriography demonstrates significant pruning of the vessels and decreased flow into the uterus. The microcatheter was removed. The Cobra catheter was formed into  a wall men's loop and the catheter brought back into the ipsilateral right internal iliac artery. Contrast injection was performed confirming the presence of a large hypertrophic uterine artery. A renegade hi Flo microcatheter was successfully advanced into the right uterine artery. Arteriography was performed confirming catheter tip location and preferential flow into the uterus. No proximal branches identified. Particle embolization was then performed again using 1 vial of 500-700 micron embospheres and 5 vials of 700-900 micron embospheres. Embolization was taken to near stasis. The microcatheter was removed. Contrast injection was performed following embolization demonstrating decreased antegrade flow into the uterus. A Sos Omni flush catheter was then advanced over a Bentson wire into the abdominal aorta. A flush aortogram was performed. The hypertrophic left ovarian artery was identified. The right ovarian artery is difficult to visualize. The Omni flush catheter was exchanged over the Bentson wire for a Mickelson catheter. The Mickelson catheter was used to successfully catheterize the left ovarian artery. Contrast injection was performed confirming a hypertrophic left ovarian artery with collateralized flow into the uterus. The renegade hi Flo microcatheter was advanced several cm into the left ovarian artery. Particle embolization was then performed using 1 vial 500-700 micron embospheres and 0.5 vials of 700-900 micron embospheres. Post  embolization arteriography demonstrates adequately decreased flow into the uterus. The catheters were removed. Hemostasis was attained with the assistance of a Celt arterial closure device. IMPRESSION: Successful bilateral uterine artery and left ovarian artery embolization for treatment of symptomatic uterine fibroids. Electronically Signed   By: Jacqulynn Cadet M.D.   On: 10/16/2021 14:52   PERIPHERAL VASCULAR CATHETERIZATION  Result Date: 10/24/2021 Images from the original result were not included. [name] MRN: [id number] DOB: 07/12/1976 Sex: female 10/24/2021 Pre-operative Diagnosis: DVT Post-operative diagnosis:  Same Surgeon:  Annamarie Major Procedure Performed:  1.  Ultrasound-guided access, right common femoral vein  2.  Inferior venacavogram  3.  Placement of infrarenal inferior vena cava filter Indications: This is a 45 year old female with left iliofemoral DVT and the inability to be anticoagulated.  She comes in today for IVC filter. Procedure:  The patient was identified in the holding area and taken to room 8.  The patient was then placed supine on the table and prepped and draped in the usual sterile fashion.  A time out was called.  Ultrasound was used to evaluate the right common pulm vein which was widely patent and easily compressible.  1% lidocaine was used for local anesthesia.  The right common femoral vein was cannulated under ultrasound guidance with an 18-gauge needle.  An 035 wire was advanced without resistance.  A pigtail catheter was placed into the right common iliac vein and inferior venacavogram was performed.  Had difficulty definitively identifying the renal veins because of bowel gas.  I took several pictures with the catheter in different positions and ultimately ended up cannulating bilateral renal veins with a H1 catheter.  I then used a Chief of Staff.  The filter sheath was inserted over the wire.  The filter was then deployed at the mid L3  vertebral body.  The sheath was removed and manual pressure was held for hemostasis.  Vena cava diameter was less than 20 mm.  There were no vena cava abnormalities. Impression:  #1 successful placement of infrarenal inferior vena cava filter  #2 would recommend vena cava filter removal once thrombotic issues have resolved. Theotis Burrow, M.D., [facility] [place] Office: [phone] Pager:  [phone]   IR  US Guide Vasc Access Right  Result Date: 10/16/2021 INDICATION: 45 year old female with hypertrophic multi fibroid uterus with resultant menorrhagia and symptomatic anemia. She requires Eliquis due to recent prior stroke with left-sided hemiplegia and DVT. She presents for uterine artery embolization. Additionally, due to the size of the uterus, there is presumed parasitized flow from the ovarian arteries as well. This will be assessed at the time of angiography. EXAM: IR EMBO ARTERIAL NOT HEMORR HEMANG INC GUIDE ROADMAPPING; IR ULTRASOUND GUIDANCE VASC ACCESS RIGHT; AORTOGRAPHY MEDICATIONS: 2 g Rocephin, 20 mg hydralazine, 60 mg Toradol (IM). The antibiotic was administered within 1 hour of the procedure ANESTHESIA/SEDATION: Moderate (conscious) sedation was employed during this procedure. A total of Versed 3 mg and Fentanyl 150 mcg was administered intravenously. Moderate Sedation Time: 111 minutes. The patient's level of consciousness and vital signs were monitored continuously by radiology nursing throughout the procedure under my direct supervision. CONTRAST:  14mL OMNIPAQUE IOHEXOL 300 MG/ML  SOLN FLUOROSCOPY TIME:  Fluoroscopy Time: 37 minutes 24 seconds (566 mGy). COMPLICATIONS: None immediate. PROCEDURE: Informed consent was obtained from the patient following explanation of the procedure, risks, benefits and alternatives. The patient understands, agrees and consents for the procedure. All questions were addressed. A time out was performed prior to the initiation  of the procedure. Maximal barrier sterile technique utilized including caps, mask, sterile gowns, sterile gloves, large sterile drape, hand hygiene, and Betadine prep. The right common femoral artery was interrogated with ultrasound and found to be widely patent. An image was obtained and stored for the permanent electronic medical record. Local anesthesia was attained by infiltration with 1% lidocaine. A small dermatotomy was made. Under real-time sonographic guidance, the vessel was punctured with a 21 gauge micropuncture needle. Using standard technique, the initial micro needle was exchanged over a 0.018 micro wire for a transitional 4 [place] micro sheath. The micro sheath was then exchanged over a 0.035 wire for a 5 French vascular sheath. A C2 cobra catheter was advanced up in over the aortic bifurcation and into the left internal iliac artery. Arteriography was performed demonstrating a markedly hypertrophic uterine artery. A renegade hi Flo microcatheter was advanced into the uterine artery and further into the horizontal segment of the uterine artery. Additional arteriography was performed confirming catheter tip location. There is preferential flow into the uterus and associated fibroids. No vesico vaginal or other proximal branches identified. Particle embolization was then performed using 1 vial of 500-700 micron embospheres and 4 vials of 700-900 micron embospheres. Embolization was taken to near stasis. Post embolization arteriography demonstrates significant pruning of the vessels and decreased flow into the uterus. The microcatheter was removed. The Cobra catheter was formed into a wall men's loop and the catheter brought back into the ipsilateral right internal iliac artery. Contrast injection was performed confirming the presence of a large hypertrophic uterine artery. A renegade hi Flo microcatheter was successfully advanced into the right uterine artery. Arteriography was performed confirming  catheter tip location and preferential flow into the uterus. No proximal branches identified. Particle embolization was then performed again using 1 vial of 500-700 micron embospheres and 5 vials of 700-900 micron embospheres. Embolization was taken to near stasis. The microcatheter was removed. Contrast injection was performed following embolization demonstrating decreased antegrade flow into the uterus. A Sos Omni flush catheter was then advanced over a Bentson wire into the abdominal aorta. A flush aortogram was performed. The hypertrophic left ovarian artery was identified. The right ovarian artery is difficult to visualize. The [employer]  flush catheter was exchanged over the Bentson wire for a Mickelson catheter. The Mickelson catheter was used to successfully catheterize the left ovarian artery. Contrast injection was performed confirming a hypertrophic left ovarian artery with collateralized flow into the uterus. The renegade hi Flo microcatheter was advanced several cm into the left ovarian artery. Particle embolization was then performed using 1 vial 500-700 micron embospheres and 0.5 vials of 700-900 micron embospheres. Post embolization arteriography demonstrates adequately decreased flow into the uterus. The catheters were removed. Hemostasis was attained with the assistance of a Celt arterial closure device. IMPRESSION: Successful bilateral uterine artery and left ovarian artery embolization for treatment of symptomatic uterine fibroids. Electronically Signed   By: Jacqulynn Cadet M.D.   On: 10/16/2021 14:52   VAS [place] IVC/ILIAC (VENOUS ONLY)  Result Date: 10/13/2021 a  IVC/ILIAC STUDY Patient Name:  Holly Bradley  Date of Exam:   10/12/2021 Medical Rec #: [id number]         Accession #:    [id number] Date of Birth: Feb 02, 1976         Patient Gender: F Patient Age:   38 years Exam Location:  [facility] Procedure:      VAS [place] IVC/ILIAC (VENOUS ONLY) Referring Phys:  --------------------------------------------------------------------------------  Limitations: Known massive uterine mass displacing anatomical structures and limiting visualization.  Comparison Study: 05/02/2021- negative lower extremity venous duplex Performing Technologist: Maudry Mayhew MHA, RDMS, RVT, RDCS  Examination Guidelines: A complete evaluation includes B-mode imaging, spectral Doppler, color Doppler, and power Doppler as needed of all accessible portions of each vessel. Bilateral testing is considered an integral part of a complete examination. Limited examinations for reoccurring indications may be performed as noted.  IVC/Iliac Findings: +----------+------+--------+------------------------------+    IVC    PatentThrombus           Comments            +----------+------+--------+------------------------------+ IVC Prox                Unable to adequately visualize +----------+------+--------+------------------------------+ IVC Mid                 Unable to adequately visualize +----------+------+--------+------------------------------+ IVC Distalpatent                                       +----------+------+--------+------------------------------+  +----------------+---------+-----------+---------+-----------------+--------+       CIV       RT-PatentRT-ThrombusLT-Patent   LT-Thrombus   Comments +----------------+---------+-----------+---------+-----------------+--------+ Common Iliac Mid                             age indeterminate         +----------------+---------+-----------+---------+-----------------+--------+  +--------------------+---------+-----------+---------+----------------+--------+         EIV         RT-PatentRT-ThrombusLT-Patent  LT-Thrombus   Comments +--------------------+---------+-----------+---------+----------------+--------+ External Iliac Vein                                    age                Mid  indeterminate           +--------------------+---------+-----------+---------+----------------+--------+   Summary: IVC/Iliac: There is evidence of age indeterminate thrombus involving the left common iliac vein. There is evidence of age indeterminate thrombus involving the left external iliac vein. Distal IVC is patent, although appears potentially compressed. Visualization of proximal Inferior Vena Cava and mid inferior vena cava was limited. Unable to visualize the right common iliac and external iliac veins due to uterine mass.  *See table(s) above for measurements and observations.  Electronically signed by Deitra Mayo MD on 10/13/2021 at 5:15:17 AM.    Final    DG Chest Port 1 View  Result Date: 10/09/2021 CLINICAL DATA:  Anemia EXAM: PORTABLE CHEST 1 VIEW COMPARISON:  06/02/2021 FINDINGS: Lungs are clear.  No pleural effusion or pneumothorax. Skin fold along the right lateral hemithorax. Cardiomegaly. IMPRESSION: Cardiomegaly. No evidence of acute cardiopulmonary disease. Electronically Signed   By: Julian Hy M.D.   On: 10/09/2021 22:17   IR EMBO ARTERIAL NOT HEMORR HEMANG INC GUIDE ROADMAPPING  Result Date: 10/16/2021 INDICATION: 45 year old female with hypertrophic multi fibroid uterus with resultant menorrhagia and symptomatic anemia. She requires Eliquis due to recent prior stroke with left-sided hemiplegia and DVT. She presents for uterine artery embolization. Additionally, due to the size of the uterus, there is presumed parasitized flow from the ovarian arteries as well. This will be assessed at the time of angiography. EXAM: IR EMBO ARTERIAL NOT HEMORR HEMANG INC GUIDE ROADMAPPING; IR ULTRASOUND GUIDANCE VASC ACCESS RIGHT; AORTOGRAPHY MEDICATIONS: 2 g Rocephin, 20 mg hydralazine, 60 mg Toradol (IM). The antibiotic was administered within 1 hour of the procedure ANESTHESIA/SEDATION: Moderate (conscious) sedation was employed during this procedure.  A total of Versed 3 mg and Fentanyl 150 mcg was administered intravenously. Moderate Sedation Time: 111 minutes. The patient's level of consciousness and vital signs were monitored continuously by radiology nursing throughout the procedure under my direct supervision. CONTRAST:  9mL OMNIPAQUE IOHEXOL 300 MG/ML  SOLN FLUOROSCOPY TIME:  Fluoroscopy Time: 37 minutes 24 seconds (566 mGy). COMPLICATIONS: None immediate. PROCEDURE: Informed consent was obtained from the patient following explanation of the procedure, risks, benefits and alternatives. The patient understands, agrees and consents for the procedure. All questions were addressed. A time out was performed prior to the initiation of the procedure. Maximal barrier sterile technique utilized including caps, mask, sterile gowns, sterile gloves, large sterile drape, hand hygiene, and Betadine prep. The right common femoral artery was interrogated with ultrasound and found to be widely patent. An image was obtained and stored for the permanent electronic medical record. Local anesthesia was attained by infiltration with 1% lidocaine. A small dermatotomy was made. Under real-time sonographic guidance, the vessel was punctured with a 21 gauge micropuncture needle. Using standard technique, the initial micro needle was exchanged over a 0.018 micro wire for a transitional 4 [place] micro sheath. The micro sheath was then exchanged over a 0.035 wire for a 5 French vascular sheath. A C2 cobra catheter was advanced up in over the aortic bifurcation and into the left internal iliac artery. Arteriography was performed demonstrating a markedly hypertrophic uterine artery. A renegade hi Flo microcatheter was advanced into the uterine artery and further into the horizontal segment of the uterine artery. Additional arteriography was performed confirming catheter tip location. There is preferential flow into the uterus and associated fibroids. No vesico vaginal or other proximal  branches identified. Particle embolization was then performed using 1 vial of 500-700 micron embospheres and 4 vials of 700-900 micron  embospheres. Embolization was taken to near stasis. Post embolization arteriography demonstrates significant pruning of the vessels and decreased flow into the uterus. The microcatheter was removed. The Cobra catheter was formed into a wall men's loop and the catheter brought back into the ipsilateral right internal iliac artery. Contrast injection was performed confirming the presence of a large hypertrophic uterine artery. A renegade hi Flo microcatheter was successfully advanced into the right uterine artery. Arteriography was performed confirming catheter tip location and preferential flow into the uterus. No proximal branches identified. Particle embolization was then performed again using 1 vial of 500-700 micron embospheres and 5 vials of 700-900 micron embospheres. Embolization was taken to near stasis. The microcatheter was removed. Contrast injection was performed following embolization demonstrating decreased antegrade flow into the uterus. A Sos Omni flush catheter was then advanced over a Bentson wire into the abdominal aorta. A flush aortogram was performed. The hypertrophic left ovarian artery was identified. The right ovarian artery is difficult to visualize. The Omni flush catheter was exchanged over the Bentson wire for a Mickelson catheter. The Mickelson catheter was used to successfully catheterize the left ovarian artery. Contrast injection was performed confirming a hypertrophic left ovarian artery with collateralized flow into the uterus. The renegade hi Flo microcatheter was advanced several cm into the left ovarian artery. Particle embolization was then performed using 1 vial 500-700 micron embospheres and 0.5 vials of 700-900 micron embospheres. Post embolization arteriography demonstrates adequately decreased flow into the uterus. The catheters were  removed. Hemostasis was attained with the assistance of a Celt arterial closure device. IMPRESSION: Successful bilateral uterine artery and left ovarian artery embolization for treatment of symptomatic uterine fibroids. Electronically Signed   By: Jacqulynn Cadet M.D.   On: 10/16/2021 14:52   VAS [place] LOWER EXTREMITY VENOUS (DVT)  Result Date: 10/23/2021  Lower Venous DVT Study Patient Name:  Holly Bradley  Date of Exam:   10/23/2021 Medical Rec #: [id number]         Accession #:    [id number] Date of Birth: 02-Dec-1976         Patient Gender: F Patient Age:   8 years Exam Location:  [facility] Procedure:      VAS [place] LOWER EXTREMITY VENOUS (DVT) Referring Phys: Dorian Pod --------------------------------------------------------------------------------  Indications: Swelling, and Edema.  Comparison Study: prior 10/12/21 Performing Technologist: Archie Patten RVS  Examination Guidelines: A complete evaluation includes B-mode imaging, spectral Doppler, color Doppler, and power Doppler as needed of all accessible portions of each vessel. Bilateral testing is considered an integral part of a complete examination. Limited examinations for reoccurring indications may be performed as noted. The reflux portion of the exam is performed with the patient in reverse Trendelenburg.  +---------+---------------+---------+-----------+----------+--------------+ RIGHT    CompressibilityPhasicitySpontaneityPropertiesThrombus Aging +---------+---------------+---------+-----------+----------+--------------+ CFV      Full           Yes      Yes                                 +---------+---------------+---------+-----------+----------+--------------+ SFJ      Full                                                        +---------+---------------+---------+-----------+----------+--------------+ FV Prox  Full                                                         +---------+---------------+---------+-----------+----------+--------------+  FV Mid   Full                                                        +---------+---------------+---------+-----------+----------+--------------+ FV DistalFull                                                        +---------+---------------+---------+-----------+----------+--------------+ PFV      Full                                                        +---------+---------------+---------+-----------+----------+--------------+ POP      Full           Yes      Yes                                 +---------+---------------+---------+-----------+----------+--------------+ PTV      Full                                                        +---------+---------------+---------+-----------+----------+--------------+ PERO     Full                                                        +---------+---------------+---------+-----------+----------+--------------+   +---------+---------------+---------+-----------+----------+-----------------+ LEFT     CompressibilityPhasicitySpontaneityPropertiesThrombus Aging    +---------+---------------+---------+-----------+----------+-----------------+ CFV      Partial        Yes      Yes                  Age Indeterminate +---------+---------------+---------+-----------+----------+-----------------+ SFJ      Full                                                           +---------+---------------+---------+-----------+----------+-----------------+ FV Prox  None                                         Age Indeterminate +---------+---------------+---------+-----------+----------+-----------------+ FV Mid   Partial                                      Age Indeterminate +---------+---------------+---------+-----------+----------+-----------------+ FV DistalFull                                                            +---------+---------------+---------+-----------+----------+-----------------+  PFV      Full                                                           +---------+---------------+---------+-----------+----------+-----------------+ POP      Full           Yes      Yes                                    +---------+---------------+---------+-----------+----------+-----------------+ PTV      Full                                                           +---------+---------------+---------+-----------+----------+-----------------+ PERO     Full                                                           +---------+---------------+---------+-----------+----------+-----------------+     Summary: RIGHT: - There is no evidence of deep vein thrombosis in the lower extremity.  - No cystic structure found in the popliteal fossa.  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 - No cystic structure found in the popliteal fossa.  *See table(s) above for measurements and observations. Electronically signed by Deitra Mayo MD on 10/23/2021 at 1:58:21 PM.    Final    VAS [place] LOWER EXTREMITY VENOUS (DVT)  Result Date: 10/13/2021  Lower Venous DVT Study Patient Name:  Holly Bradley  Date of Exam:   10/12/2021 Medical Rec #: [id number]         Accession #:    [id number] Date of Birth: 05-30-1976         Patient Gender: F Patient Age:   13 years Exam Location:  [facility] Procedure:      VAS [place] LOWER EXTREMITY VENOUS (DVT) Referring Phys: STEPHEN CHIU --------------------------------------------------------------------------------  Indications: Edema.  Comparison Study: 05/02/2020- negative lower extremity venous duplex Performing Technologist: Maudry Mayhew MHA, RDMS, RVT, RDCS  Examination Guidelines: A complete evaluation includes B-mode imaging, spectral Doppler, color Doppler, and power Doppler  as needed of all accessible portions of each vessel. Bilateral testing is considered an integral part of a complete examination. Limited examinations for reoccurring indications may be performed as noted. The reflux portion of the exam is performed with the patient in reverse Trendelenburg.  +-----+---------------+---------+-----------+----------+--------------+ RIGHTCompressibilityPhasicitySpontaneityPropertiesThrombus Aging +-----+---------------+---------+-----------+----------+--------------+ CFV  Full           Yes                 patent                   +-----+---------------+---------+-----------+----------+--------------+   +---------+---------------+---------+-----------+----------+-------------------+ LEFT     CompressibilityPhasicitySpontaneityPropertiesThrombus Aging      +---------+---------------+---------+-----------+----------+-------------------+ CFV      None  No                   Age Indeterminate   +---------+---------------+---------+-----------+----------+-------------------+ SFJ      None                                         Age Indeterminate   +---------+---------------+---------+-----------+----------+-------------------+ FV Prox  Full                               patent                        +---------+---------------+---------+-----------+----------+-------------------+ FV Mid   None                                         Age Indeterminate   +---------+---------------+---------+-----------+----------+-------------------+ FV DistalFull                               patent                        +---------+---------------+---------+-----------+----------+-------------------+ PFV      Full                               patent                        +---------+---------------+---------+-----------+----------+-------------------+ POP      Full           No       Yes        patent                         +---------+---------------+---------+-----------+----------+-------------------+ PTV      Full                               patent                        +---------+---------------+---------+-----------+----------+-------------------+ PERO     Full                               patent                        +---------+---------------+---------+-----------+----------+-------------------+ EIV                              Yes                  partial age                                                               indeterminate  thrombus            +---------+---------------+---------+-----------+----------+-------------------+ CIV                              No                   Age Indeterminate   +---------+---------------+---------+-----------+----------+-------------------+   Left Technical Findings: Not visualized segments include Limited visualization of PTV and peroneal veins.   Summary: RIGHT: - No evidence of common femoral vein obstruction.  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  *See table(s) above for measurements and observations. Electronically signed by Deitra Mayo MD on 10/13/2021 at 5:15:36 AM.    Final    CT Angio Abd/Pel w/ and/or w/o  Result Date: 10/13/2021 CLINICAL DATA:  Vaginal bleeding, history of uterine fibroids. EXAM: CTA ABDOMEN AND PELVIS WITHOUT AND WITH CONTRAST TECHNIQUE: Multidetector CT imaging of the abdomen and pelvis was performed using the standard protocol during bolus administration of intravenous contrast. Multiplanar reconstructed images and MIPs were obtained and reviewed to evaluate the vascular anatomy. CONTRAST:  134mL OMNIPAQUE IOHEXOL 350 MG/ML SOLN COMPARISON:  CT abdomen/pelvis 01/11/2021 FINDINGS:  VASCULAR Aorta: Mild atherosclerotic calcifications along the abdominal aorta. No evidence of aneurysm or dissection. Celiac: Patent without evidence of aneurysm, dissection, vasculitis or significant stenosis. SMA: Patent without evidence of aneurysm, dissection, vasculitis or significant stenosis. Renals: Both renal arteries are patent without evidence of aneurysm, dissection, vasculitis, fibromuscular dysplasia or significant stenosis. IMA: Patent without evidence of aneurysm, dissection, vasculitis or significant stenosis. Inflow: Patent without evidence of aneurysm, dissection, vasculitis or significant stenosis. Proximal Outflow: Bilateral common femoral and visualized portions of the superficial and profunda femoral arteries are patent without evidence of aneurysm, dissection, vasculitis or significant stenosis. Other: Hypertrophic uterine arteries bilaterally supplying the massively enlarged uterus. The origins of both uterine arteries are easily identified arising from the anterior divisions of the internal iliac arteries bilaterally. Additionally, both ovarian arteries are slightly hyperplastic and likely providing supply to the fibroid uterus. The origins of the ovarian arteries can be identified on the coronal reformatted images. Veins: No obvious venous abnormality within the limitations of this arterial phase study. Review of the MIP images confirms the above findings. NON-VASCULAR Lower chest: Cardiomegaly.  Small right pleural effusion. Hepatobiliary: No focal liver abnormality is seen. No gallstones, gallbladder wall thickening, or biliary dilatation. Pancreas: Unremarkable. No pancreatic ductal dilatation or surrounding inflammatory changes. Spleen: Normal in size without focal abnormality. Adrenals/Urinary Tract: Adrenal glands are unremarkable. Kidneys are normal, without renal calculi, focal lesion, or hydronephrosis. Bladder is unremarkable. Mild lobularity of the kidneys without focal defect  or scarring. Stomach/Bowel: Stomach is within normal limits. Appendix appears normal. No evidence of bowel wall thickening, distention, or inflammatory changes. Lymphatic: No suspicious lymphadenopathy. Reproductive: Massively enlarged multi fibroid uterus measuring approximately 19.6 x 16.4 x 22 cm (volume = 3700 cm^3). No evidence of active arterial bleeding at this time. Many of the fibroids are heterogeneous and some demonstrate areas of hypo vascularity consistent with regions of prior degeneration. Dystrophic calcifications are present bilaterally. At least 1 pedunculated subserosal fibroid arising from the left aspect of the uterus. No definitive ovarian mass. Other: Trace pelvic ascites is likely within normal limits for reproductive age female. Musculoskeletal: No acute or significant osseous findings. IMPRESSION: 1. Massive multi fibroid uterus  with a prominent pedunculated subserosal fibroid arising from the left aspect of the uterine body. Approximate size of the uterus is 3.7 kg. 2. No evidence of active arterial bleeding at this time. 3. Urine arterial supply via bilateral hypertrophic uterine arteries arising from their typical anatomical position as well as bilateral hypertrophic ovarian arteries (see coronal reformatted images to identify vessel origins). 4. Mild atherosclerotic calcifications without focal stenosis or occlusion. 5. Cardiomegaly consistent with history of CHF. 6. Small right pleural effusion. Electronically Signed   By: Jacqulynn Cadet M.D.   On: 10/13/2021 07:18    Discharge Instructions: Discharge Instructions     Call MD for:  difficulty breathing, headache or visual disturbances   Complete by: As directed    Call MD for:  persistant nausea and vomiting   Complete by: As directed    Call MD for:  redness, tenderness, or signs of infection (pain, swelling, redness, odor or green/yellow discharge around incision site)   Complete by: As directed    Call MD for:  severe  uncontrolled pain   Complete by: As directed    Call MD for:  temperature >100.4   Complete by: As directed    Diet - low sodium heart healthy   Complete by: As directed    Discharge wound care:   Complete by: As directed    Please change dressing on back regularly   Increase activity slowly   Complete by: As directed        Signed: France Ravens, MD 10/27/2021, 9:31 AM   Pager: [phone]

## 2021-10-26 NOTE — Progress Notes (Signed)
Subjective:  No acute events overnight.  Patient evaluated at bedside.  Patient endorsing significant back pain. Patient states pain medications are not helping.  Discussed plan with patient and updated brother with plan as well.  All questions answered.  Discussed possible discharge tomorrow.  Patient verbalized agreement.   Objective:   Vital signs in last 24 hours:       Vitals:    10/14/21 2059 10/15/21 0010 10/15/21 0409 10/15/21 0928  BP: 114/63 (!) 103/55 (!) 149/74 (!) 155/70  Pulse: 77 75 72 66  Resp: 18 16 18 18   Temp: 98.4 F (36.9 C) 98.5 F (36.9 C) (!) 97.5 F (36.4 C) 97.8 F (36.6 C)  TempSrc: Oral Oral Axillary Oral  SpO2: 100% 100% 100% 100%  Weight:          Height:            Hgb 10.9 -> 8.6 WBC: 12.4 -> 12.1 Platelets: 455 -> 470  Constitutional: alert, appears uncomfortable HENT: normocephalic, atraumatic, mucous membranes moist Eyes: conjunctiva non-erythematous, EOMI Cardiovascular: RRR, holosystolic murmur; trace pitting edema of LLE  Pulmonary/Chest: normal work of breathing on room air, LCTAB Abdominal: soft, distended abdomen, no guarding or rigidity. Mild suprapubic tenderness MSK: normal bulk and tone, diffuse tenderness of the left lower extremity Neurological: A&O x 3, left-sided hemiplegia with left arm contracted Skin: Small area of ecchymosis the posterior lateral left heel, right heel appears normal Urinary: Dark red urine in canister from purewick Psych: anxious, normal affect   Assessment/Plan:   Principal Problem:   Acute blood loss anemia Active Problems:   Menorrhagia   Uncontrolled hypertension   Hemorrhagic stroke [facility])   Uterine fibroid   Left hemiparesis (HCC)   Lupus anticoagulant positive   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)   Pressure injury of skin    45 y.o. 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   Acute blood loss anemia 2/2 menorragia from uterine fibroids s/p [place] Leukocytosis, resolving  S/p [place] 10/25; Hgb stable at ~8.5 over past several days, no signs of profuse bleeding and no obvious blood draining from pure wick. WBC downtrending 22.1 -> 15.0 -> 13.2, likely 2/2 postembolization syndrome; UA clean with no growth on urine Cx, CXR negative, CT abd/pelvis without concerns for infection, and BC NG at 3 days. Will withhold from starting empiric abx given no source and clinical improvement, but will start if she clinically deteriorates. Patient's hypertension elevated today, so plan for discharge back to [facility] tomorrow if BP more under control tomorrow.  - Hold Eliquis due to anemia - Monitor CBC and transfuse if Hg < 7 - Follow blood cultures, no growth to date  - Monitor fever curve and CBC - Pain management: tylenol (scheduled), norco, toradol, dilaudid  -Increase oxycodone 10 mg every 6 hours for pain  -Increase dilaudid 1 mg q6 hr for pain - 1 month outpatient IR follow up with Dr. Laurence Ferrari - TOC following: d/c to [facility] once medically stable    Prior Hx of CVA with residual left sided hemiplegia Provoked LLE DVT s/p IVC filter Lupus Anticoagulant neg on confirmatory test. Resumed Eliquis 10/27  for LE DVT. Hgb remains stable.  - Continue to hold Eliquis, will eventually need ASA 81 mg qd  - Atorvastatin 40 qd - Follow up with Dr Leonie Man in stroke clinic -Vascular surgery placed IVC filter. Will request Vascular surgery reassess patient in 3 months with regards to DVT, anticoagulation, and IVC filter removal.    Deep tissue injury of the left heel Noted to have ecchymosis of the left posterior lateral heel, no drainage,  warmth, purulence -Continue with Prevalon boots -Apply foam dressing to avoid friction  HTN Elevated SBP 160s-180s no episodes of hypotension.  - Avoid overcorrection of BP given stroke hx - Start Entresto 97-103 mg twice daily - Continue Clonidine 0.1mg  three times daily   Hx of CHF Remains free of signs of fluid overload and euvolemic on exam.   - Continue entresto, coreg, imdur   Hx of SVT on amiodorone  Hemodynamically stable HR controlled. - Continue amiodarone     Best Practice: Diet: Dys 3 diet  IVF: Non VTE: none  Code: Partial code     France Ravens, MD Internal Medicine Resident, PGY-1 [facility]  Pager: [phone] 3:40 PM, 10/26/2021

## 2021-10-26 NOTE — TOC Progression Note (Addendum)
Transition of Care [facility]) - Progression Note    Patient Details  Name: Holly Bradley MRN: [id number] Date of Birth: Jun 09, 1976  Transition of Care [facility]) CM/SW [place], [place] Phone Number: 10/26/2021, 2:49 PM  Clinical Narrative:    Pt is not medically ready for DC, CSW informed Nikki at [facility] who is okay with pt DC early tomorrow.   Expected Discharge Plan: [place] Barriers to Discharge: No Barriers Identified  Expected Discharge Plan and Services Expected Discharge Plan: [place]   Discharge Planning Services: CM Consult   Living arrangements for the past 2 months: [place]                                       Social Determinants of Health (SDOH) Interventions    Readmission Risk Interventions No flowsheet data found.

## 2021-10-27 LAB — CBC
HCT: 30.1 % — ABNORMAL LOW (ref 36.0–46.0)
Hemoglobin: 9.2 g/dL — ABNORMAL LOW (ref 12.0–15.0)
MCH: 25.5 pg — ABNORMAL LOW (ref 26.0–34.0)
MCHC: 30.6 g/dL (ref 30.0–36.0)
MCV: 83.4 fL (ref 80.0–100.0)
Platelets: 507 10*3/uL — ABNORMAL HIGH (ref 150–400)
RBC: 3.61 MIL/uL — ABNORMAL LOW (ref 3.87–5.11)
RDW: 22 % — ABNORMAL HIGH (ref 11.5–15.5)
WBC: 12.6 10*3/uL — ABNORMAL HIGH (ref 4.0–10.5)
nRBC: 0 % (ref 0.0–0.2)

## 2021-10-27 LAB — BASIC METABOLIC PANEL
Anion gap: 6 (ref 5–15)
BUN: 9 mg/dL (ref 6–20)
CO2: 21 mmol/L — ABNORMAL LOW (ref 22–32)
Calcium: 9.8 mg/dL (ref 8.9–10.3)
Chloride: 110 mmol/L (ref 98–111)
Creatinine, Ser: 0.58 mg/dL (ref 0.44–1.00)
GFR, Estimated: >60 mL/min (ref >60)
Glucose, Bld: 85 mg/dL (ref 70–99)
Potassium: 4.4 mmol/L (ref 3.5–5.1)
Sodium: 137 mmol/L (ref 135–145)

## 2021-10-27 LAB — TYPE AND SCREEN
ABO/RH(D): O POS
Antibody Screen: NEGATIVE
Donor AG Type: NEGATIVE
Donor AG Type: NEGATIVE
Unit division: 0
Unit division: 0

## 2021-10-27 LAB — BPAM RBC
Blood Product Expiration Date: 202211252359
Blood Product Expiration Date: 202212012359
ISSUE DATE / TIME: 202211020124
Unit Type and Rh: 5100
Unit Type and Rh: 5100

## 2021-10-27 MED ORDER — BISACODYL 5 MG PO TBEC: 10.0000 mg | DELAYED_RELEASE_TABLET | Freq: Every day | ORAL | 0 refills | Status: DC

## 2021-10-27 MED ORDER — OXYCODONE HCL 10 MG PO TABS: 10.0000 mg | ORAL_TABLET | Freq: Four times a day (QID) | ORAL | 0 refills | Status: AC

## 2021-10-27 MED ORDER — POLYETHYLENE GLYCOL 3350 17 G PO PACK: 17.0000 g | PACK | Freq: Every day | ORAL | 0 refills | Status: AC

## 2021-10-27 MED ORDER — BACLOFEN 20 MG PO TABS
20.0000 mg | ORAL_TABLET | Freq: Three times a day (TID) | ORAL | 0 refills | Status: AC
Start: 2021-10-27 — End: ?

## 2021-10-27 MED ORDER — SENNOSIDES-DOCUSATE SODIUM 8.6-50 MG PO TABS
2.0000 | ORAL_TABLET | Freq: Two times a day (BID) | ORAL | 0 refills | Status: AC
Start: 2021-10-27 — End: 2021-11-26

## 2021-10-27 MED ORDER — ACETAMINOPHEN 500 MG PO TABS
1000.0000 mg | ORAL_TABLET | Freq: Four times a day (QID) | ORAL | 0 refills | Status: DC
Start: 2021-10-27 — End: 2021-12-12

## 2021-10-27 NOTE — TOC Transition Note (Signed)
Transition of Care [facility]) - CM/SW Discharge Note   Patient Details  Name: Holly Bradley MRN: [id number] Date of Birth: 03/24/1976  Transition of Care [facility]) CM/SW Contact:  Bary Castilla, LCSW Phone Number: [phone] 10/27/2021, 9:52 AM   Clinical Narrative:     Patient will DC to:?[facility] Anticipated DC date:?10/27/2021 Family notified:?[name] Transport by: Corey Harold   Per MD patient ready for DC to [facility], patient, patient's family, and facility notified of DC. Discharge Summary sent to facility. RN given number for report [phone] room 301. DC packet on chart. Ambulance transport requested for patient.   CSW signing off.   [place], [place] [phone]   Final next level of care: Skilled Nursing Facility Barriers to Discharge: No Barriers Identified   Patient Goals and CMS Choice Patient states their goals for this hospitalization and ongoing recovery are:: return to [facility].gov Compare Post Acute Care list provided to:: Patient Choice offered to / list presented to : Patient  Discharge Placement              Patient chooses bed at: [facility] Patient to be transferred to facility by: [place] Name of family member notified: [name] Patient and family notified of of transfer: 10/27/21  Discharge Plan and Services   Discharge Planning Services: CM Consult                                 Social Determinants of Health ([place]) Interventions     Readmission Risk Interventions No flowsheet data found.

## 2021-10-27 NOTE — Progress Notes (Signed)
RN on hold with [facility] for 20 minutes. Unable to speak with nurse to give report. Edwena Blow, RN

## 2021-10-29 ENCOUNTER — Telehealth: Payer: Self-pay | Admitting: Hematology and Oncology

## 2021-10-29 NOTE — Telephone Encounter (Signed)
Scheduled appt per 10/14 referral. Spoke to pt's brother first who told me pt is currently in residence at [facility], told me to call them to sch appt. I called [facility] and spoke with appt coordinator. She is aware of pt's upcoming appt date, time and location.

## 2021-11-12 ENCOUNTER — Inpatient Hospital Stay: Payer: Medicaid - Out of State | Attending: Hematology and Oncology | Admitting: Hematology and Oncology

## 2021-11-12 ENCOUNTER — Inpatient Hospital Stay: Payer: Medicaid - Out of State

## 2021-11-12 NOTE — Progress Notes (Deleted)
[place] [phone]   Fax:([phone]) [place] NOTE  Patient Care Team: Mitzi Hansen, MD as PCP - General (Internal Medicine) Vickie Epley, MD as PCP - Electrophysiology (Cardiology) Freada Bergeron, MD as PCP - Cardiology (Cardiology) Bensimhon, Shaune Pascal, MD as PCP - Advanced Heart Failure (Cardiology) Jorge Ny, LCSW as Social Worker (Licensed Clinical Social Worker) Mitzi Hansen, MD (Internal Medicine) Freada Bergeron, MD (Cardiology)  Hematological/Oncological History # Left Lower Extremity DVT 10/16/2021: patient underwent IR embolization of bilateral uterine artery and left ovarian artery for heavily bleeding fibroid.  10/23/2021: lower extremity dopplers show age indeterminate deep vein thrombosis involving the left common femoral vein, and left femoral vein.  10/24/2021: IVC filter placed due to left iliofemoral DVT and inability to be anticoagulated.   CHIEF COMPLAINTS/PURPOSE OF CONSULTATION:  "Left Lower Extremity DVT "  HISTORY OF PRESENTING ILLNESS:  Holly Bradley 45 y.o. female with medical history significant for congestive heart failure, essential hypertension, fibroid with menorrhagia and chronic anemia, hypertension, and paroxysmal supraventricular tachycardia who presents for evaluation of a left lower extremity DVT.  On review of the previous records Holly Bradley ***  On exam today ***  MEDICAL HISTORY:  Past Medical History:  Diagnosis Date   CHF (congestive heart failure) (HCC)    Chronic blood loss anemia    Related to uterine fibroids   Essential hypertension    Poorly controlled, repeat by report only on nicardipine 90 mg   Fibroid    Likely complicated by by menometrorrhagia and chronic anemia   Hypertension    Paroxysmal SVT (supraventricular tachycardia) (HCC)    Frequent episodes, can last anywhere from 20 minutes to 12-15 hours.  Not on beta-blocker or calcium channel  blocker   Uterine fibroid     SURGICAL HISTORY: Past Surgical History:  Procedure Laterality Date   IR ANGIOGRAM FOLLOW UP STUDY  05/15/2021   IR AORTAGRAM ABDOMINAL SERIALOGRAM  10/16/2021   IR CT HEAD LTD  05/15/2021   IR EMBO ARTERIAL NOT HEMORR HEMANG INC GUIDE ROADMAPPING  10/16/2021   IR PERCUTANEOUS ART THROMBECTOMY/INFUSION INTRACRANIAL INC DIAG ANGIO  05/15/2021       IR PERCUTANEOUS ART THROMBECTOMY/INFUSION INTRACRANIAL INC DIAG ANGIO  05/15/2021   IR TRANSCATH/EMBOLIZ  05/15/2021   IR US GUIDE VASC ACCESS RIGHT  10/16/2021   IVC FILTER INSERTION N/A 10/24/2021   Procedure: IVC FILTER INSERTION;  Surgeon: Serafina Mitchell, MD;  Location: [facility];  Service: Cardiovascular;  Laterality: N/A;   RADIOLOGY WITH ANESTHESIA N/A 05/15/2021   Procedure: IR WITH ANESTHESIA;  Surgeon: Radiologist, Medication, MD;  Location: [facility];  Service: Radiology;  Laterality: N/A;   Unknown      SOCIAL HISTORY: Social History   Socioeconomic History   Marital status: Single    Spouse name: Not on file   Number of children: 0   Years of education: Not on file   Highest education level: Not on file  Occupational History   Not on file  Tobacco Use   Smoking status: Every Day   Smokeless tobacco: Never  Vaping Use   Vaping Use: Never used  Substance and Sexual Activity   Alcohol use: Not Currently    Comment: Occasionally   Drug use: Not Currently    Types: Marijuana   Sexual activity: Not on file  Other Topics Concern   Not on file  Social History Narrative   ** Merged History Encounter **  Social Determinants of Health   Financial Resource Strain: High Risk   Difficulty of Paying Living Expenses: Very hard  Food Insecurity: No Food Insecurity   Worried About Charity fundraiser in the Last Year: Never true   Ran Out of Food in the Last Year: Never true  Transportation Needs: No Transportation Needs   Lack of Transportation (Medical): No   Lack of Transportation  (Non-Medical): No  Physical Activity: Not on file  Stress: Not on file  Social Connections: Not on file  Intimate Partner Violence: Not on file    FAMILY HISTORY: Family History  Problem Relation Age of Onset   Dementia Mother     ALLERGIES:  has No Known Allergies.  MEDICATIONS:  Current Outpatient Medications  Medication Sig Dispense Refill   acetaminophen (TYLENOL) 500 MG tablet Take 2 tablets (1,000 mg total) by mouth every 6 (six) hours. 30 tablet 0   Amino Acids-Protein Hydrolys (FEEDING SUPPLEMENT, PRO-STAT SUGAR FREE 64,) LIQD Take 30 mLs by mouth 2 (two) times daily.     amiodarone (PACERONE) 200 MG tablet Take 200 mg by mouth daily.     aspirin 81 MG chewable tablet Chew 1 tablet (81 mg total) by mouth daily.     atorvastatin (LIPITOR) 40 MG tablet Take 1 tablet (40 mg total) by mouth daily.     baclofen (LIORESAL) 20 MG tablet Take 1 tablet (20 mg total) by mouth 3 (three) times daily. 30 each 0   bisacodyl (DULCOLAX) 5 MG EC tablet Take 2 tablets (10 mg total) by mouth daily. 30 tablet 0   carvedilol (COREG) 25 MG tablet Take 1 tablet (25 mg total) by mouth 2 (two) times daily with a meal.     cloNIDine (CATAPRES) 0.1 MG tablet Take 1 tablet (0.1 mg total) by mouth 3 (three) times daily. 60 tablet 11   escitalopram (LEXAPRO) 10 MG tablet Take 15 mg by mouth daily.     feeding supplement (ENSURE ENLIVE / ENSURE PLUS) LIQD Take 237 mLs by mouth 3 (three) times daily between meals. 237 mL 12   ferrous sulfate 325 (65 FE) MG tablet Take 1 tablet (325 mg total) by mouth 2 (two) times daily with a meal. 60 tablet 3   hydrALAZINE (APRESOLINE) 100 MG tablet Take 1 tablet (100 mg total) by mouth 3 (three) times daily. 90 tablet 3   isosorbide mononitrate (IMDUR) 60 MG 24 hr tablet Take 60 mg by mouth daily.     lactulose (CHRONULAC) 10 GM/15ML solution Take 15 mLs (10 g total) by mouth 2 (two) times daily. 236 mL 0   linaclotide (LINZESS) 145 MCG CAPS capsule Take 145 mcg by mouth  daily before breakfast.     Multiple Vitamin (MULTIVITAMIN WITH MINERALS) TABS tablet Take 1 tablet by mouth daily.     pantoprazole (PROTONIX) 40 MG tablet Take 40 mg by mouth daily.     polyethylene glycol (MIRALAX / GLYCOLAX) 17 g packet Take 17 g by mouth daily. 14 each 0   sacubitril-valsartan (ENTRESTO) 97-103 MG Take 1 tablet by mouth 2 (two) times daily. 60 tablet 3   senna-docusate (SENOKOT-S) 8.6-50 MG tablet Take 2 tablets by mouth 2 (two) times daily. 120 tablet 0   simethicone (MYLICON) 40 DT/2.6ZT drops Take 0.6 mLs (40 mg total) by mouth every 6 (six) hours as needed for flatulence. 30 mL 0   torsemide (DEMADEX) 20 MG tablet TAKE 2 TABLETS (40 MG TOTAL) BY MOUTH 2 (TWO) TIMES DAILY. (Patient  not taking: Reported on 10/10/2021) 120 tablet 2   No current facility-administered medications for this visit.    REVIEW OF SYSTEMS:   Constitutional: ( - ) fevers, ( - )  chills , ( - ) night sweats Eyes: ( - ) blurriness of vision, ( - ) double vision, ( - ) watery eyes Ears, nose, mouth, throat, and face: ( - ) mucositis, ( - ) sore throat Respiratory: ( - ) cough, ( - ) dyspnea, ( - ) wheezes Cardiovascular: ( - ) palpitation, ( - ) chest discomfort, ( - ) lower extremity swelling Gastrointestinal:  ( - ) nausea, ( - ) heartburn, ( - ) change in bowel habits Skin: ( - ) abnormal skin rashes Lymphatics: ( - ) new lymphadenopathy, ( - ) easy bruising Neurological: ( - ) numbness, ( - ) tingling, ( - ) new weaknesses Behavioral/Psych: ( - ) mood change, ( - ) new changes  All other systems were reviewed with the patient and are negative.  PHYSICAL EXAMINATION: ECOG PERFORMANCE STATUS: {CHL ONC ECOG [id number]}  There were no vitals filed for this visit. There were no vitals filed for this visit.  GENERAL: well appearing *** in NAD  SKIN: skin color, texture, turgor are normal, no rashes or significant lesions EYES: conjunctiva are pink and non-injected, sclera  clear OROPHARYNX: no exudate, no erythema; lips, buccal mucosa, and tongue normal  NECK: supple, non-tender LYMPH:  no palpable lymphadenopathy in the cervical, axillary or supraclavicular lymph nodes.  LUNGS: clear to auscultation and percussion with normal breathing effort HEART: regular rate & rhythm and no murmurs and no lower extremity edema ABDOMEN: soft, non-tender, non-distended, normal bowel sounds Musculoskeletal: no cyanosis of digits and no clubbing  PSYCH: alert & oriented x 3, fluent speech NEURO: no focal motor/sensory deficits  LABORATORY DATA:  I have reviewed the data as listed CBC Latest Ref Rng & Units 10/27/2021 10/26/2021 10/25/2021  WBC 4.0 - 10.5 K/uL 12.6(H) 11.9(H) 12.1(H)  Hemoglobin 12.0 - 15.0 g/dL 9.2(L) 8.9(L) 8.6(L)  Hematocrit 36.0 - 46.0 % 30.1(L) 27.2(L) 28.0(L)  Platelets 150 - 400 K/uL 507(H) 424(H) 470(H)    CMP Latest Ref Rng & Units 10/27/2021 10/26/2021 10/26/2021  Glucose 70 - 99 mg/dL 85 - 82  BUN 6 - 20 mg/dL 9 - 14  Creatinine 0.44 - 1.00 mg/dL 0.58 - 0.67  Sodium 135 - 145 mmol/L 137 - 136  Potassium 3.5 - 5.1 mmol/L 4.4 4.5 6.0(H)  Chloride 98 - 111 mmol/L 110 - 109  CO2 22 - 32 mmol/L 21(L) - 18(L)  Calcium 8.9 - 10.3 mg/dL 9.8 - 9.4  Total Protein 6.5 - 8.1 g/dL - - -  Total Bilirubin 0.3 - 1.2 mg/dL - - -  Alkaline Phos 38 - 126 U/L - - -  AST 15 - 41 U/L - - -  ALT 0 - 44 U/L - - -     PATHOLOGY: ***  BLOOD FILM: *** Review of the peripheral blood smear showed normal appearing white cells with neutrophils that were appropriately lobated and granulated. There was no predominance of bi-lobed or hyper-segmented neutrophils appreciated. No Dohle bodies were noted. There was no left shifting, immature forms or blasts noted. Lymphocytes remain normal in size without any predominance of large granular lymphocytes. Red cells show no anisopoikilocytosis, macrocytes , microcytes or polychromasia. There were no schistocytes, target cells,  echinocytes, acanthocytes, dacrocytes, or stomatocytes.There was no rouleaux formation, nucleated red cells, or intra-cellular inclusions noted. The platelets  are normal in size, shape, and color without any clumping evident.  RADIOGRAPHIC STUDIES: I have personally reviewed the radiological images as listed and agreed with the findings in the report. DG Chest 1 View  Result Date: 10/19/2021 CLINICAL DATA:  Leukocytosis EXAM: CHEST  1 VIEW COMPARISON:  10/09/2021 FINDINGS: Lungs are well expanded, symmetric, and clear. No pneumothorax or pleural effusion. Cardiac size is mildly enlarged, unchanged. Pulmonary vascularity is normal. Osseous structures are age-appropriate. No acute bone abnormality. IMPRESSION: No active disease.  Stable cardiomegaly. Electronically Signed   By: Fidela Salisbury M.D.   On: 10/19/2021 21:05   CT ABDOMEN PELVIS W CONTRAST  Result Date: 10/21/2021 CLINICAL DATA:  45 year old female with abdominal and pelvic pain. Recent uterine artery embolization. EXAM: CT ABDOMEN AND PELVIS WITH CONTRAST TECHNIQUE: Multidetector CT imaging of the abdomen and pelvis was performed using the standard protocol following bolus administration of intravenous contrast. CONTRAST:  183mL OMNIPAQUE IOHEXOL 300 MG/ML  SOLN COMPARISON:  10/12/2021 CT. 10/16/2021 bilateral uterine artery and LEFT ovarian artery embolization. FINDINGS: Lower chest: Marked cardiomegaly again noted. No acute abnormalities. Hepatobiliary: The liver and gallbladder are unremarkable except tiny probable hepatic cysts. Pancreas: Unremarkable Spleen: Unremarkable Adrenals/Urinary Tract: Unchanged nonobstructing punctate calculi within the mid-LOWER RIGHT kidney noted. Scattered areas of LEFT renal scarring are again identified. The adrenal glands and bladder are unremarkable. Stomach/Bowel: There is no evidence of bowel obstruction, definite bowel wall thickening or inflammatory changes. Vascular/Lymphatic: Aortic atherosclerosis.  No enlarged abdominal or pelvic lymph nodes. Reproductive: Massively enlarged uterus containing multiple fibroids are again noted. Significant decrease in uterine enhancement noted following embolization. A few tiny foci of gas within the LOWER uterine fibroids are noted, not unexpected following embolization. Other: No ascites, focal collection or pneumoperitoneum. Diffuse subcutaneous edema is again noted Musculoskeletal: No acute or suspicious bony abnormalities are noted. IMPRESSION: 1. Massively enlarged uterus containing multiple fibroids again noted with decreased uterine enhancement following uterine embolization. Small foci of gas within LOWER uterine fibroids are not unexpected following embolization. Follow-up clinically. 2. Marked cardiomegaly and diffuse subcutaneous edema again noted. 3. Nonobstructing RIGHT renal calculi. 4. Aortic Atherosclerosis (ICD10-I70.0). Electronically Signed   By: Margarette Canada M.D.   On: 10/21/2021 16:09   IR Aortagram Abdominal Serialogram  Result Date: 10/16/2021 INDICATION: 45 year old female with hypertrophic multi fibroid uterus with resultant menorrhagia and symptomatic anemia. She requires Eliquis due to recent prior stroke with left-sided hemiplegia and DVT. She presents for uterine artery embolization. Additionally, due to the size of the uterus, there is presumed parasitized flow from the ovarian arteries as well. This will be assessed at the time of angiography. EXAM: IR EMBO ARTERIAL NOT HEMORR HEMANG INC GUIDE ROADMAPPING; IR ULTRASOUND GUIDANCE VASC ACCESS RIGHT; AORTOGRAPHY MEDICATIONS: 2 g Rocephin, 20 mg hydralazine, 60 mg Toradol (IM). The antibiotic was administered within 1 hour of the procedure ANESTHESIA/SEDATION: Moderate (conscious) sedation was employed during this procedure. A total of Versed 3 mg and Fentanyl 150 mcg was administered intravenously. Moderate Sedation Time: 111 minutes. The patient's level of consciousness and vital signs were  monitored continuously by radiology nursing throughout the procedure under my direct supervision. CONTRAST:  54mL OMNIPAQUE IOHEXOL 300 MG/ML  SOLN FLUOROSCOPY TIME:  Fluoroscopy Time: 37 minutes 24 seconds (566 mGy). COMPLICATIONS: None immediate. PROCEDURE: Informed consent was obtained from the patient following explanation of the procedure, risks, benefits and alternatives. The patient understands, agrees and consents for the procedure. All questions were addressed. A time out was performed prior to the initiation of  the procedure. Maximal barrier sterile technique utilized including caps, mask, sterile gowns, sterile gloves, large sterile drape, hand hygiene, and Betadine prep. The right common femoral artery was interrogated with ultrasound and found to be widely patent. An image was obtained and stored for the permanent electronic medical record. Local anesthesia was attained by infiltration with 1% lidocaine. A small dermatotomy was made. Under real-time sonographic guidance, the vessel was punctured with a 21 gauge micropuncture needle. Using standard technique, the initial micro needle was exchanged over a 0.018 micro wire for a transitional 4 [place] micro sheath. The micro sheath was then exchanged over a 0.035 wire for a 5 French vascular sheath. A C2 cobra catheter was advanced up in over the aortic bifurcation and into the left internal iliac artery. Arteriography was performed demonstrating a markedly hypertrophic uterine artery. A renegade hi Flo microcatheter was advanced into the uterine artery and further into the horizontal segment of the uterine artery. Additional arteriography was performed confirming catheter tip location. There is preferential flow into the uterus and associated fibroids. No vesico vaginal or other proximal branches identified. Particle embolization was then performed using 1 vial of 500-700 micron embospheres and 4 vials of 700-900 micron embospheres. Embolization was taken  to near stasis. Post embolization arteriography demonstrates significant pruning of the vessels and decreased flow into the uterus. The microcatheter was removed. The Cobra catheter was formed into a wall men's loop and the catheter brought back into the ipsilateral right internal iliac artery. Contrast injection was performed confirming the presence of a large hypertrophic uterine artery. A renegade hi Flo microcatheter was successfully advanced into the right uterine artery. Arteriography was performed confirming catheter tip location and preferential flow into the uterus. No proximal branches identified. Particle embolization was then performed again using 1 vial of 500-700 micron embospheres and 5 vials of 700-900 micron embospheres. Embolization was taken to near stasis. The microcatheter was removed. Contrast injection was performed following embolization demonstrating decreased antegrade flow into the uterus. A Sos Omni flush catheter was then advanced over a Bentson wire into the abdominal aorta. A flush aortogram was performed. The hypertrophic left ovarian artery was identified. The right ovarian artery is difficult to visualize. The Omni flush catheter was exchanged over the Bentson wire for a Mickelson catheter. The Mickelson catheter was used to successfully catheterize the left ovarian artery. Contrast injection was performed confirming a hypertrophic left ovarian artery with collateralized flow into the uterus. The renegade hi Flo microcatheter was advanced several cm into the left ovarian artery. Particle embolization was then performed using 1 vial 500-700 micron embospheres and 0.5 vials of 700-900 micron embospheres. Post embolization arteriography demonstrates adequately decreased flow into the uterus. The catheters were removed. Hemostasis was attained with the assistance of a Celt arterial closure device. IMPRESSION: Successful bilateral uterine artery and left ovarian artery embolization for  treatment of symptomatic uterine fibroids. Electronically Signed   By: Jacqulynn Cadet M.D.   On: 10/16/2021 14:52   PERIPHERAL VASCULAR CATHETERIZATION  Result Date: 10/24/2021 Images from the original result were not included. [name] MRN: [id number] DOB: [birth year] Sex: female 10/24/2021 Pre-operative Diagnosis: DVT Post-operative diagnosis:  Same Surgeon:  Annamarie Major Procedure Performed:  1.  Ultrasound-guided access, right common femoral vein  2.  Inferior venacavogram  3.  Placement of infrarenal inferior vena cava filter Indications: This is a 45 year old female with left iliofemoral DVT and the inability to be anticoagulated.  She comes in today for IVC filter. Procedure:  The patient was identified in the holding area and taken to room 8.  The patient was then placed supine on the table and prepped and draped in the usual sterile fashion.  A time out was called.  Ultrasound was used to evaluate the right common pulm vein which was widely patent and easily compressible.  1% lidocaine was used for local anesthesia.  The right common femoral vein was cannulated under ultrasound guidance with an 18-gauge needle.  An 035 wire was advanced without resistance.  A pigtail catheter was placed into the right common iliac vein and inferior venacavogram was performed.  Had difficulty definitively identifying the renal veins because of bowel gas.  I took several pictures with the catheter in different positions and ultimately ended up cannulating bilateral renal veins with a H1 catheter.  I then used a Chief of Staff.  The filter sheath was inserted over the wire.  The filter was then deployed at the mid L3 vertebral body.  The sheath was removed and manual pressure was held for hemostasis.  Vena cava diameter was less than 20 mm.  There were no vena cava abnormalities. Impression:  #1 successful placement of infrarenal inferior vena cava filter  #2 would recommend vena cava filter  removal once thrombotic issues have resolved. Theotis Burrow, M.D., [facility] [place] Office: [phone] Pager:  [phone]   IR US Guide Vasc Access Right  Result Date: 10/16/2021 INDICATION: 45 year old female with hypertrophic multi fibroid uterus with resultant menorrhagia and symptomatic anemia. She requires Eliquis due to recent prior stroke with left-sided hemiplegia and DVT. She presents for uterine artery embolization. Additionally, due to the size of the uterus, there is presumed parasitized flow from the ovarian arteries as well. This will be assessed at the time of angiography. EXAM: IR EMBO ARTERIAL NOT HEMORR HEMANG INC GUIDE ROADMAPPING; IR ULTRASOUND GUIDANCE VASC ACCESS RIGHT; AORTOGRAPHY MEDICATIONS: 2 g Rocephin, 20 mg hydralazine, 60 mg Toradol (IM). The antibiotic was administered within 1 hour of the procedure ANESTHESIA/SEDATION: Moderate (conscious) sedation was employed during this procedure. A total of Versed 3 mg and Fentanyl 150 mcg was administered intravenously. Moderate Sedation Time: 111 minutes. The patient's level of consciousness and vital signs were monitored continuously by radiology nursing throughout the procedure under my direct supervision. CONTRAST:  48mL OMNIPAQUE IOHEXOL 300 MG/ML  SOLN FLUOROSCOPY TIME:  Fluoroscopy Time: 37 minutes 24 seconds (566 mGy). COMPLICATIONS: None immediate. PROCEDURE: Informed consent was obtained from the patient following explanation of the procedure, risks, benefits and alternatives. The patient understands, agrees and consents for the procedure. All questions were addressed. A time out was performed prior to the initiation of the procedure. Maximal barrier sterile technique utilized including caps, mask, sterile gowns, sterile gloves, large sterile drape, hand hygiene, and Betadine prep. The right common femoral artery was interrogated with ultrasound and found to be widely patent. An image was  obtained and stored for the permanent electronic medical record. Local anesthesia was attained by infiltration with 1% lidocaine. A small dermatotomy was made. Under real-time sonographic guidance, the vessel was punctured with a 21 gauge micropuncture needle. Using standard technique, the initial micro needle was exchanged over a 0.018 micro wire for a transitional 4 [place] micro sheath. The micro sheath was then exchanged over a 0.035 wire for a 5 French vascular sheath. A C2 cobra catheter was advanced up in over the aortic bifurcation and into the left internal iliac artery. Arteriography was performed demonstrating a markedly hypertrophic  uterine artery. A renegade hi Flo microcatheter was advanced into the uterine artery and further into the horizontal segment of the uterine artery. Additional arteriography was performed confirming catheter tip location. There is preferential flow into the uterus and associated fibroids. No vesico vaginal or other proximal branches identified. Particle embolization was then performed using 1 vial of 500-700 micron embospheres and 4 vials of 700-900 micron embospheres. Embolization was taken to near stasis. Post embolization arteriography demonstrates significant pruning of the vessels and decreased flow into the uterus. The microcatheter was removed. The Cobra catheter was formed into a wall men's loop and the catheter brought back into the ipsilateral right internal iliac artery. Contrast injection was performed confirming the presence of a large hypertrophic uterine artery. A renegade hi Flo microcatheter was successfully advanced into the right uterine artery. Arteriography was performed confirming catheter tip location and preferential flow into the uterus. No proximal branches identified. Particle embolization was then performed again using 1 vial of 500-700 micron embospheres and 5 vials of 700-900 micron embospheres. Embolization was taken to near stasis. The  microcatheter was removed. Contrast injection was performed following embolization demonstrating decreased antegrade flow into the uterus. A Sos Omni flush catheter was then advanced over a Bentson wire into the abdominal aorta. A flush aortogram was performed. The hypertrophic left ovarian artery was identified. The right ovarian artery is difficult to visualize. The Omni flush catheter was exchanged over the Bentson wire for a Mickelson catheter. The Mickelson catheter was used to successfully catheterize the left ovarian artery. Contrast injection was performed confirming a hypertrophic left ovarian artery with collateralized flow into the uterus. The renegade hi Flo microcatheter was advanced several cm into the left ovarian artery. Particle embolization was then performed using 1 vial 500-700 micron embospheres and 0.5 vials of 700-900 micron embospheres. Post embolization arteriography demonstrates adequately decreased flow into the uterus. The catheters were removed. Hemostasis was attained with the assistance of a Celt arterial closure device. IMPRESSION: Successful bilateral uterine artery and left ovarian artery embolization for treatment of symptomatic uterine fibroids. Electronically Signed   By: Jacqulynn Cadet M.D.   On: 10/16/2021 14:52   IR EMBO ARTERIAL NOT HEMORR HEMANG INC GUIDE ROADMAPPING  Result Date: 10/16/2021 INDICATION: 45 year old female with hypertrophic multi fibroid uterus with resultant menorrhagia and symptomatic anemia. She requires Eliquis due to recent prior stroke with left-sided hemiplegia and DVT. She presents for uterine artery embolization. Additionally, due to the size of the uterus, there is presumed parasitized flow from the ovarian arteries as well. This will be assessed at the time of angiography. EXAM: IR EMBO ARTERIAL NOT HEMORR HEMANG INC GUIDE ROADMAPPING; IR ULTRASOUND GUIDANCE VASC ACCESS RIGHT; AORTOGRAPHY MEDICATIONS: 2 g Rocephin, 20 mg hydralazine, 60 mg  Toradol (IM). The antibiotic was administered within 1 hour of the procedure ANESTHESIA/SEDATION: Moderate (conscious) sedation was employed during this procedure. A total of Versed 3 mg and Fentanyl 150 mcg was administered intravenously. Moderate Sedation Time: 111 minutes. The patient's level of consciousness and vital signs were monitored continuously by radiology nursing throughout the procedure under my direct supervision. CONTRAST:  65mL OMNIPAQUE IOHEXOL 300 MG/ML  SOLN FLUOROSCOPY TIME:  Fluoroscopy Time: 37 minutes 24 seconds (566 mGy). COMPLICATIONS: None immediate. PROCEDURE: Informed consent was obtained from the patient following explanation of the procedure, risks, benefits and alternatives. The patient understands, agrees and consents for the procedure. All questions were addressed. A time out was performed prior to the initiation of the procedure. Maximal barrier sterile technique  utilized including caps, mask, sterile gowns, sterile gloves, large sterile drape, hand hygiene, and Betadine prep. The right common femoral artery was interrogated with ultrasound and found to be widely patent. An image was obtained and stored for the permanent electronic medical record. Local anesthesia was attained by infiltration with 1% lidocaine. A small dermatotomy was made. Under real-time sonographic guidance, the vessel was punctured with a 21 gauge micropuncture needle. Using standard technique, the initial micro needle was exchanged over a 0.018 micro wire for a transitional 4 [place] micro sheath. The micro sheath was then exchanged over a 0.035 wire for a 5 French vascular sheath. A C2 cobra catheter was advanced up in over the aortic bifurcation and into the left internal iliac artery. Arteriography was performed demonstrating a markedly hypertrophic uterine artery. A renegade hi Flo microcatheter was advanced into the uterine artery and further into the horizontal segment of the uterine artery. Additional  arteriography was performed confirming catheter tip location. There is preferential flow into the uterus and associated fibroids. No vesico vaginal or other proximal branches identified. Particle embolization was then performed using 1 vial of 500-700 micron embospheres and 4 vials of 700-900 micron embospheres. Embolization was taken to near stasis. Post embolization arteriography demonstrates significant pruning of the vessels and decreased flow into the uterus. The microcatheter was removed. The Cobra catheter was formed into a wall men's loop and the catheter brought back into the ipsilateral right internal iliac artery. Contrast injection was performed confirming the presence of a large hypertrophic uterine artery. A renegade hi Flo microcatheter was successfully advanced into the right uterine artery. Arteriography was performed confirming catheter tip location and preferential flow into the uterus. No proximal branches identified. Particle embolization was then performed again using 1 vial of 500-700 micron embospheres and 5 vials of 700-900 micron embospheres. Embolization was taken to near stasis. The microcatheter was removed. Contrast injection was performed following embolization demonstrating decreased antegrade flow into the uterus. A Sos Omni flush catheter was then advanced over a Bentson wire into the abdominal aorta. A flush aortogram was performed. The hypertrophic left ovarian artery was identified. The right ovarian artery is difficult to visualize. The Omni flush catheter was exchanged over the Bentson wire for a Mickelson catheter. The Mickelson catheter was used to successfully catheterize the left ovarian artery. Contrast injection was performed confirming a hypertrophic left ovarian artery with collateralized flow into the uterus. The renegade hi Flo microcatheter was advanced several cm into the left ovarian artery. Particle embolization was then performed using 1 vial 500-700 micron  embospheres and 0.5 vials of 700-900 micron embospheres. Post embolization arteriography demonstrates adequately decreased flow into the uterus. The catheters were removed. Hemostasis was attained with the assistance of a Celt arterial closure device. IMPRESSION: Successful bilateral uterine artery and left ovarian artery embolization for treatment of symptomatic uterine fibroids. Electronically Signed   By: Jacqulynn Cadet M.D.   On: 10/16/2021 14:52   VAS [place] LOWER EXTREMITY VENOUS (DVT)  Result Date: 10/23/2021  Lower Venous DVT Study Patient Name:  Holly Bradley  Date of Exam:   10/23/2021 Medical Rec #: [id number]         Accession #:    [id number] Date of Birth: [birth year]         Patient Gender: F Patient Age:   85 years Exam Location:  [facility] Procedure:      VAS [place] LOWER EXTREMITY VENOUS (DVT) Referring Phys: Dorian Pod --------------------------------------------------------------------------------  Indications: Swelling, and Edema.  Comparison Study: prior 10/12/21 Performing Technologist: Archie Patten RVS  Examination Guidelines: A complete evaluation includes B-mode imaging, spectral Doppler, color Doppler, and power Doppler as needed of all accessible portions of each vessel. Bilateral testing is considered an integral part of a complete examination. Limited examinations for reoccurring indications may be performed as noted. The reflux portion of the exam is performed with the patient in reverse Trendelenburg.  +---------+---------------+---------+-----------+----------+--------------+ RIGHT    CompressibilityPhasicitySpontaneityPropertiesThrombus Aging +---------+---------------+---------+-----------+----------+--------------+ CFV      Full           Yes      Yes                                 +---------+---------------+---------+-----------+----------+--------------+ SFJ      Full                                                         +---------+---------------+---------+-----------+----------+--------------+ FV Prox  Full                                                        +---------+---------------+---------+-----------+----------+--------------+ FV Mid   Full                                                        +---------+---------------+---------+-----------+----------+--------------+ FV DistalFull                                                        +---------+---------------+---------+-----------+----------+--------------+ PFV      Full                                                        +---------+---------------+---------+-----------+----------+--------------+ POP      Full           Yes      Yes                                 +---------+---------------+---------+-----------+----------+--------------+ PTV      Full                                                        +---------+---------------+---------+-----------+----------+--------------+ PERO     Full                                                        +---------+---------------+---------+-----------+----------+--------------+   +---------+---------------+---------+-----------+----------+-----------------+  LEFT     CompressibilityPhasicitySpontaneityPropertiesThrombus Aging    +---------+---------------+---------+-----------+----------+-----------------+ CFV      Partial        Yes      Yes                  Age Indeterminate +---------+---------------+---------+-----------+----------+-----------------+ SFJ      Full                                                           +---------+---------------+---------+-----------+----------+-----------------+ FV Prox  None                                         Age Indeterminate +---------+---------------+---------+-----------+----------+-----------------+ FV Mid   Partial                                      Age Indeterminate  +---------+---------------+---------+-----------+----------+-----------------+ FV DistalFull                                                           +---------+---------------+---------+-----------+----------+-----------------+ PFV      Full                                                           +---------+---------------+---------+-----------+----------+-----------------+ POP      Full           Yes      Yes                                    +---------+---------------+---------+-----------+----------+-----------------+ PTV      Full                                                           +---------+---------------+---------+-----------+----------+-----------------+ PERO     Full                                                           +---------+---------------+---------+-----------+----------+-----------------+     Summary: RIGHT: - There is no evidence of deep vein thrombosis in the lower extremity.  - No cystic structure found in the popliteal fossa.  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 - No cystic structure found in the popliteal fossa.  *See table(s) above for measurements and observations. Electronically signed by Harrell Gave  Scot Dock MD on 10/23/2021 at 1:58:21 PM.    Final     ASSESSMENT & PLAN Holly Bradley 45 y.o. female with medical history significant for congestive heart failure, essential hypertension, fibroid with menorrhagia and chronic anemia, hypertension, and paroxysmal supraventricular tachycardia who presents for evaluation of a left lower extremity DVT.  After review of the labs, review of the records, and discussion with the patient the patients findings are most consistent with ***  #Iron Deficiency Anemia 2/2 to GYN Bleeding -- Today we will order CBC, CMP, iron panel, and ferritin --Recommend patient continue ferrous sulfate 325 mg  daily --Recommend proceeding with IV iron therapy given the patient's heavy menorrhagia --Return to clinic ***  #Left lower extremity DVT --IVC filter is currently in place.  These become prothrombotic after approximately 6 months time.  As long as this is in would recommend anticoagulation therapy --Due to the unprovoked nature of this lower extremity DVT would recommend indefinite anticoagulation --Recommend continuation for 6 months time then holding in order to retrieve IVC filter --***  No orders of the defined types were placed in this encounter.   All questions were answered. The patient knows to call the clinic with any problems, questions or concerns.  A total of more than {CHL ONC TIME VISIT - FFMBW:[id number]} were spent on this encounter with face-to-face time and non-face-to-face time, including preparing to see the patient, ordering tests and/or medications, counseling the patient and coordination of care as outlined above.   Ledell Peoples, MD Department of Hematology/Oncology [place] [facility] Phone: [phone] Pager: [phone] Email: [name]  11/12/2021 9:49 AM

## 2021-11-21 ENCOUNTER — Encounter (HOSPITAL_COMMUNITY): Payer: Self-pay | Admitting: *Deleted

## 2021-11-21 ENCOUNTER — Emergency Department (HOSPITAL_COMMUNITY): Payer: Medicaid Other

## 2021-11-21 ENCOUNTER — Telehealth: Payer: Self-pay | Admitting: Hematology and Oncology

## 2021-11-21 ENCOUNTER — Other Ambulatory Visit: Payer: Self-pay

## 2021-11-21 ENCOUNTER — Inpatient Hospital Stay (HOSPITAL_COMMUNITY)
Admission: EM | Admit: 2021-11-21 | Discharge: 2021-11-24 | DRG: 947 | Disposition: A | Discharge status: Skilled Nursing Facility | Payer: Medicaid Other | Source: Skilled Nursing Facility | Attending: Internal Medicine | Admitting: Internal Medicine

## 2021-11-21 DIAGNOSIS — E785 Hyperlipidemia, unspecified: Secondary | ICD-10-CM | POA: Diagnosis present

## 2021-11-21 DIAGNOSIS — I471 Supraventricular tachycardia: Secondary | ICD-10-CM | POA: Diagnosis present

## 2021-11-21 DIAGNOSIS — F17200 Nicotine dependence, unspecified, uncomplicated: Secondary | ICD-10-CM | POA: Diagnosis present

## 2021-11-21 DIAGNOSIS — N920 Excessive and frequent menstruation with regular cycle: Secondary | ICD-10-CM | POA: Diagnosis present

## 2021-11-21 DIAGNOSIS — R531 Weakness: Principal | ICD-10-CM

## 2021-11-21 DIAGNOSIS — I69354 Hemiplegia and hemiparesis following cerebral infarction affecting left non-dominant side: Secondary | ICD-10-CM

## 2021-11-21 DIAGNOSIS — Z79899 Other long term (current) drug therapy: Secondary | ICD-10-CM

## 2021-11-21 DIAGNOSIS — U071 COVID-19: Secondary | ICD-10-CM | POA: Diagnosis present

## 2021-11-21 DIAGNOSIS — E876 Hypokalemia: Secondary | ICD-10-CM | POA: Diagnosis present

## 2021-11-21 DIAGNOSIS — I509 Heart failure, unspecified: Secondary | ICD-10-CM | POA: Diagnosis present

## 2021-11-21 DIAGNOSIS — Z86718 Personal history of other venous thrombosis and embolism: Secondary | ICD-10-CM

## 2021-11-21 DIAGNOSIS — I69320 Aphasia following cerebral infarction: Secondary | ICD-10-CM

## 2021-11-21 DIAGNOSIS — D500 Iron deficiency anemia secondary to blood loss (chronic): Secondary | ICD-10-CM | POA: Diagnosis present

## 2021-11-21 DIAGNOSIS — R29810 Facial weakness: Secondary | ICD-10-CM | POA: Diagnosis present

## 2021-11-21 DIAGNOSIS — R109 Unspecified abdominal pain: Secondary | ICD-10-CM

## 2021-11-21 DIAGNOSIS — I69322 Dysarthria following cerebral infarction: Secondary | ICD-10-CM

## 2021-11-21 DIAGNOSIS — R4701 Aphasia: Secondary | ICD-10-CM

## 2021-11-21 DIAGNOSIS — D259 Leiomyoma of uterus, unspecified: Secondary | ICD-10-CM | POA: Diagnosis present

## 2021-11-21 DIAGNOSIS — N179 Acute kidney failure, unspecified: Secondary | ICD-10-CM | POA: Diagnosis present

## 2021-11-21 DIAGNOSIS — I110 Hypertensive heart disease with heart failure: Secondary | ICD-10-CM | POA: Diagnosis present

## 2021-11-21 DIAGNOSIS — E860 Dehydration: Secondary | ICD-10-CM | POA: Diagnosis present

## 2021-11-21 IMAGING — CT CT HEAD W/O CM
3 of 4 series · 14 of 47 positions shown, 16 images · non-contrast
Comparison: Head CT [DATE].

CLINICAL DATA: Speech deficit, stroke suspected.

EXAM:
CT HEAD WITHOUT CONTRAST
TECHNIQUE: Contiguous axial images were obtained from the base of the skull
through the vertex without intravenous contrast.

[Series 4: head 5.0 h30s · axial · 0.43mm/px · z∈[-154,-14]mm · 8 of 34 slices shown, 10 images]
[im 3/34  brain]
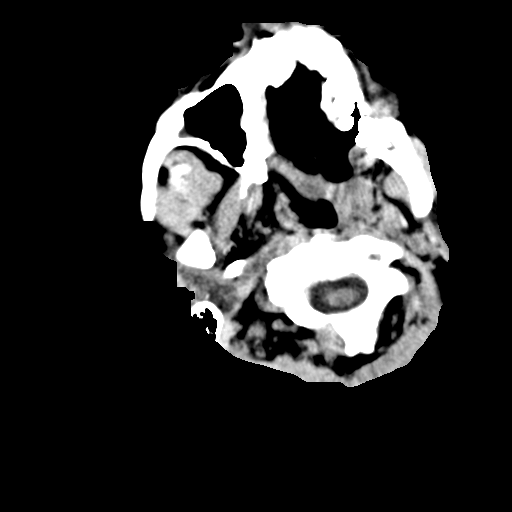
[im 3/34  bone]
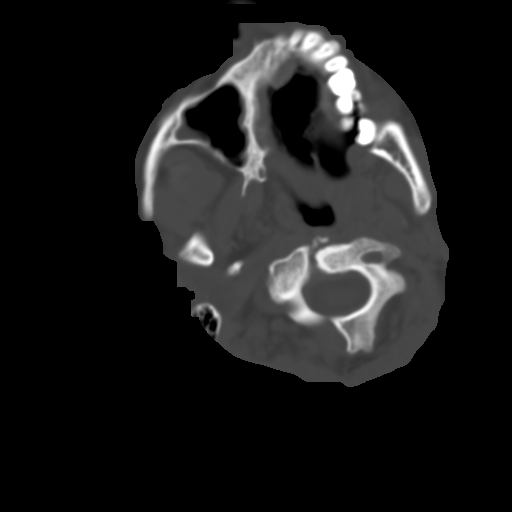
[im 8/34  brain]
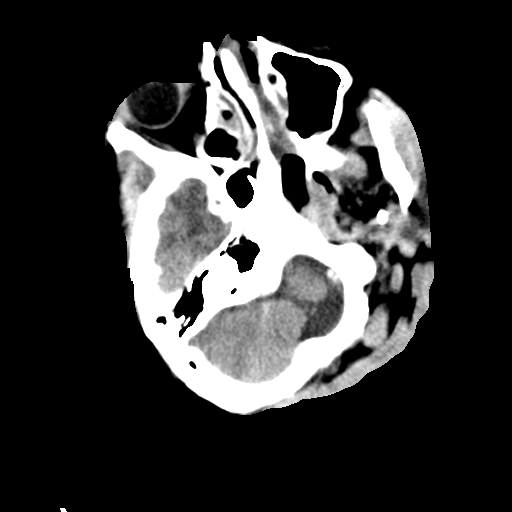
[im 12/34  brain]
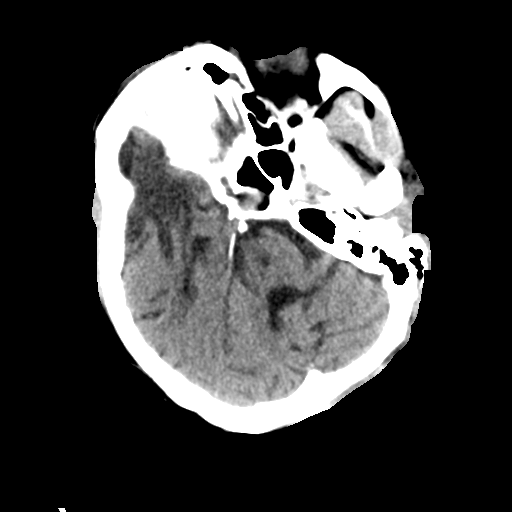
[im 15/34  brain]
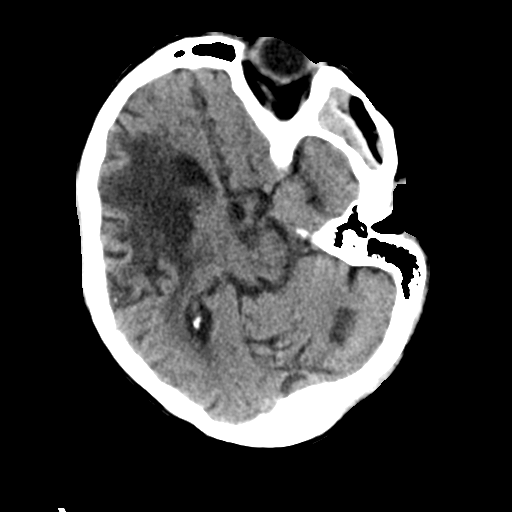
[im 19/34  brain]
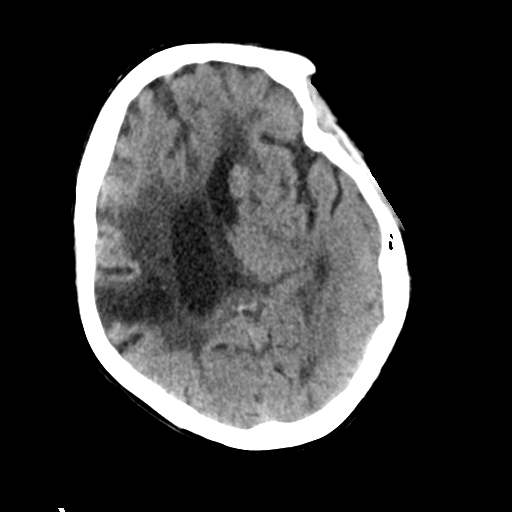
[im 19/34  bone]
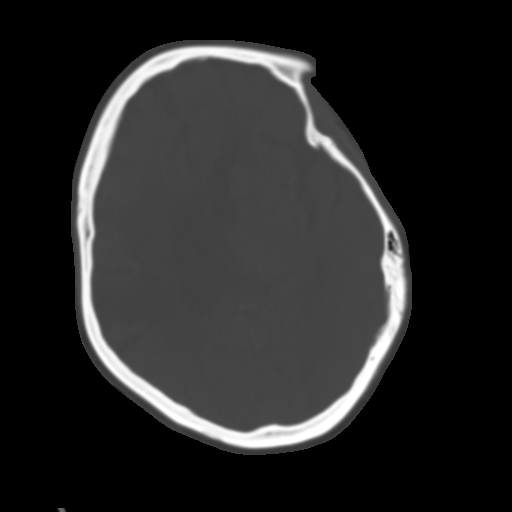
[im 22/34  brain]
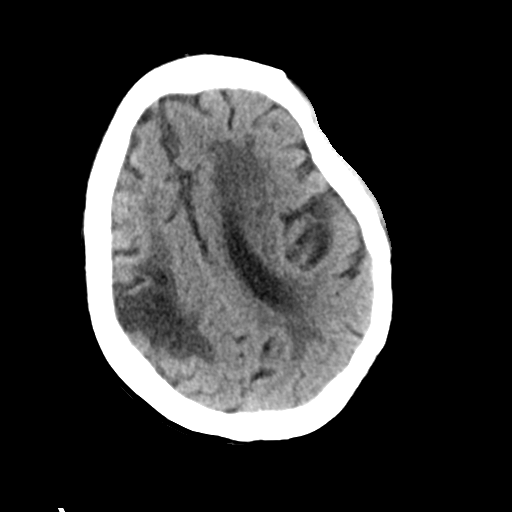
[im 26/34  brain]
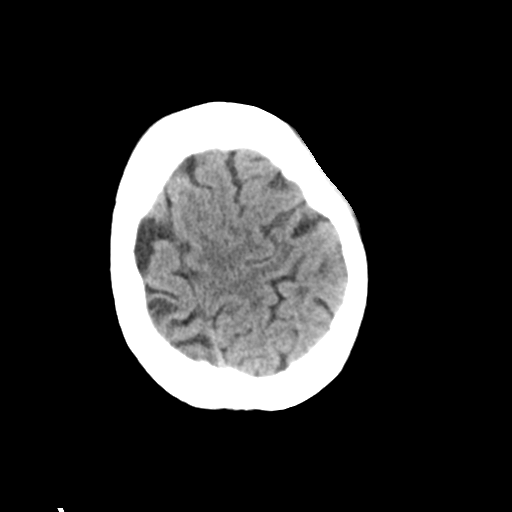
[im 31/34  brain]
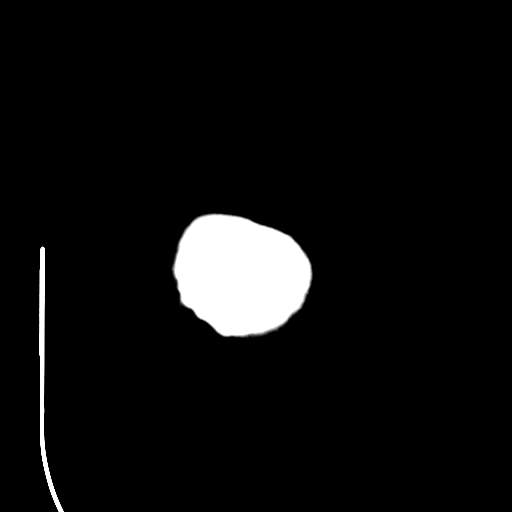

[Series 7: head 3.0 mpr cor · coronal · 0.32mm/px · 3 of 65 slices shown]
[im 22/65  brain]
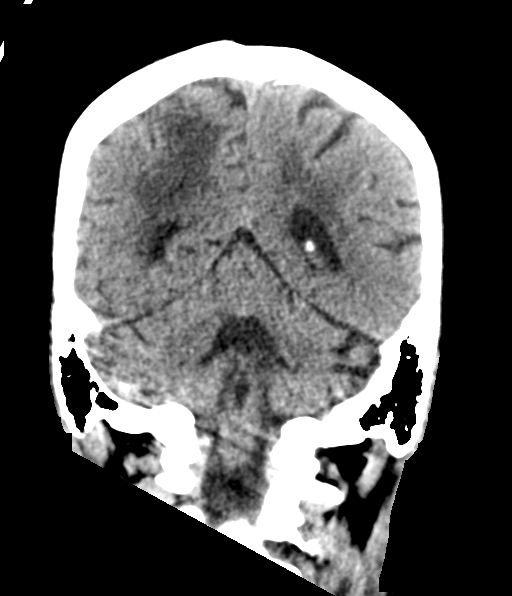
[im 29/65  brain]
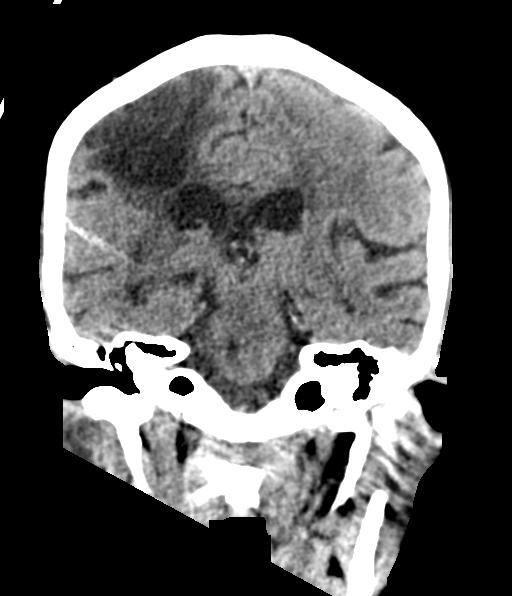
[im 36/65  brain]
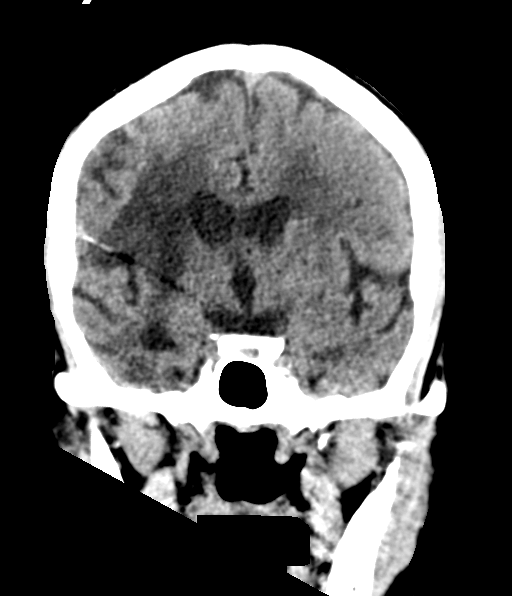

[Series 8: head 3.0 mpr sag · sagittal · 0.35mm/px · 3 of 58 slices shown]
[im 27/58  brain]
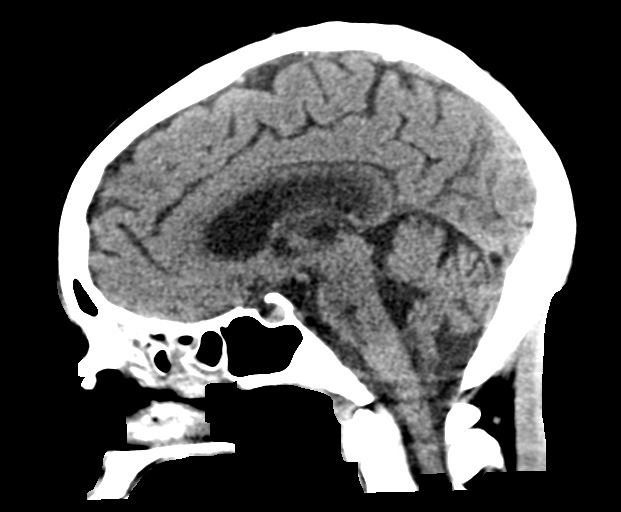
[im 29/58  brain]
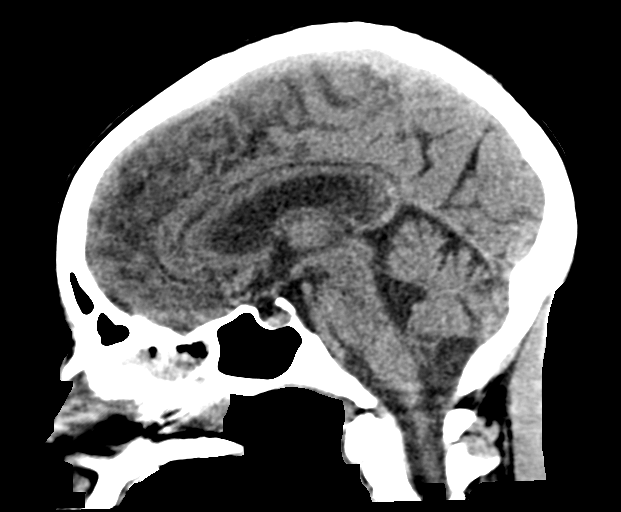
[im 31/58  brain]
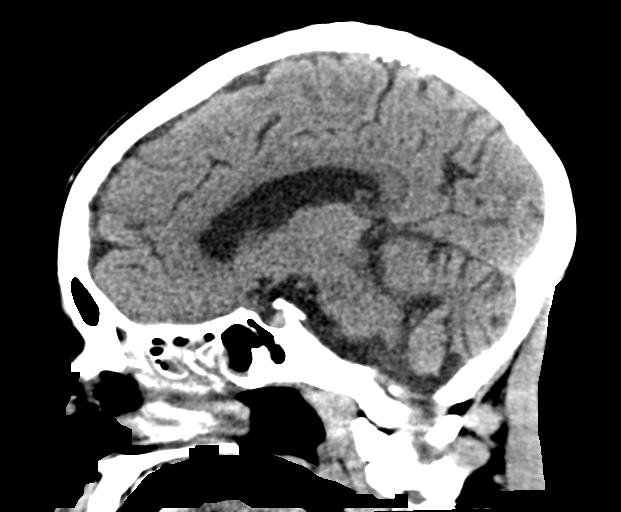

[14 of 47 positions shown; findings below may reference images not displayed]

FINDINGS: Brain: Prior study demonstrated cytotoxic edema in the right
frontotemporal region which now demonstrates broad-based
encephalomalacia and volume loss consistent with evolution of a now
chronic right MCA infarct. The encephalomalacia merges with
additional encephalomalacia in the right parietal lobe. There is ex
vacuo expansion of the right lateral ventricle, with no midline
shift.

Embolization coils are again noted in the posterosuperior right
temporal lobe. There is underlying mild-to-moderate cerebral atrophy
and moderate to severe small vessel disease of cerebral white
matter, chronic lacunar infarcts in the left gangliocapsular area
and brainstem, and an old infarct of the superior left cerebellar
hemisphere. There is atrophic ventriculomegaly.

No new asymmetry is seen concerning for an acute infarct, hemorrhage
or mass, but the small vessel disease and old right MCA infarct
limit assessment for isolated white matter ischemia.

Vascular: There patchy calcifications in the carotid siphons, distal
vertebral arteries without hyperdense central vasculature.

Skull: Normal. Negative for fracture or focal lesion.

Sinuses/Orbits: There is increased opacification of multiple
bilateral ethmoid air cells, increased membrane thickening in the
right frontal and bilateral maxillary and sphenoid sinus air cells,
without intrasinus fluid levels or mastoid effusion. Orbital
contents are unremarkable.

Other: None.
IMPRESSION: 1. No acute intracranial CT findings, but advanced small vessel
disease and old right MCA infarct limit assessment for isolated
white matter ischemia. Follow-up with MRI if there is concern for
occult infarct.
2. Additional chronic infarct in the superior left cerebellum,
lacunar infarcts left gangliocapsular area and brainstem as seen
previously.
3. Worsening sinus disease greatest in the ethmoid air cells,
without fluid levels but with multifocal ethmoid sinus
opacification.

## 2021-11-21 MED ORDER — SODIUM CHLORIDE 0.9 % IV SOLN: Freq: Once | INTRAVENOUS | Status: AC

## 2021-11-21 MED ORDER — FENTANYL CITRATE PF 50 MCG/ML IJ SOSY
25.0000 ug | PREFILLED_SYRINGE | Freq: Once | INTRAMUSCULAR | Status: AC
  Administered 2021-11-22: 25 ug via INTRAVENOUS
  Filled 2021-11-21: qty 1

## 2021-11-21 NOTE — ED Provider Notes (Signed)
[facility] EMERGENCY DEPARTMENT Provider Note   CSN: [id number] Arrival date & time: 11/21/21  2212     History Chief Complaint  Patient presents with   Aphasia    Holly Bradley is a 45 y.o. female.  Patient with history of CVA and left sided paralysis presents today from [facility] for aphasia. Last known normal at 2300 last night. Discussed with caretaker who states she left from her shift at this time and the patient was speaking and moving her right side feeding herself which is her baseline. Found today by morning nursing staff to be aphasic with right side facial droop and drooling. They apparently did sepsis workup with labs and CXR without acute findings and therefore then decided to send her to the emergency department for further evaluation.  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  However, she is normally able to speak in complete sentences and is alert and oriented.  She normally moves her right side and is able to feed herself.  Additionally, her facility states that she has been hypertensive all day.  She is able to nod her head to answer questions, and endorses headache chest pain and abdominal pain.   The history is provided by the nursing home.      Past Medical History:  Diagnosis Date   CHF (congestive heart failure) (HCC)    Chronic blood loss anemia    Related to uterine fibroids   Essential hypertension    Poorly controlled, repeat by report only on nicardipine 90 mg   Fibroid    Likely complicated by by menometrorrhagia and chronic anemia   Hypertension    Paroxysmal SVT (supraventricular tachycardia) (HCC)    Frequent episodes, can last anywhere from 20 minutes to 12-15 hours.  Not on beta-blocker or calcium channel blocker   Uterine fibroid     Patient Active Problem List   Diagnosis Date Noted   Pressure injury of skin 10/13/2021   Lupus  anticoagulant positive    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)    Acute blood loss anemia 10/10/2021   Hypertension    Non-ischemic cardiomyopathy (HCC)    Swelling of left hand    Proctocolitis    Constipation    Uterine fibroid    Left hemiparesis (HCC)    Hemorrhagic stroke (HCC)    Abnormal uterine bleeding (AUB)    Staphylococcus epidermidis bacteremia    Iron deficiency anemia due to chronic blood loss 05/25/2021   Cerebral edema (HCC) 05/25/2021   Chronic HFrEF (heart failure with reduced ejection fraction) ([place]) 05/25/2021   Pneumonia due to methicillin susceptible Staphylococcus aureus ([place]) 05/25/2021   Acute respiratory failure with hypoxia ([place])    Acute right MCA stroke ([place]) 05/15/2021   Uncontrolled hypertension 05/15/2021   Cerebrovascular accident (CVA) ([place]) 05/15/2021   IDA (iron deficiency anemia) 03/01/2021   Healthcare maintenance 03/01/2021   Onychomycosis 03/01/2021   Food insecurity 02/02/2021   Psychosis ([place])    Fibroid 01/11/2021   Menorrhagia 01/11/2021   Acute exacerbation of CHF (congestive heart failure) ([place]) 01/10/2021   AKI (acute kidney injury) ([place])    Symptomatic anemia    SVT (supraventricular tachycardia) ([place]) 05/01/2020   CHF (congestive heart failure) ([place]) 05/01/2020    Past Surgical History:  Procedure Laterality Date   IR ANGIOGRAM FOLLOW UP STUDY  05/15/2021   IR AORTAGRAM ABDOMINAL SERIALOGRAM  10/16/2021  IR CT HEAD LTD  05/15/2021   IR EMBO ARTERIAL NOT HEMORR HEMANG INC GUIDE ROADMAPPING  10/16/2021   IR PERCUTANEOUS ART THROMBECTOMY/INFUSION INTRACRANIAL INC DIAG ANGIO  05/15/2021       IR PERCUTANEOUS ART THROMBECTOMY/INFUSION INTRACRANIAL INC DIAG ANGIO  05/15/2021   IR TRANSCATH/EMBOLIZ  05/15/2021   IR US GUIDE VASC ACCESS RIGHT  10/16/2021   IVC FILTER INSERTION N/A 10/24/2021   Procedure: IVC FILTER INSERTION;  Surgeon: Serafina Mitchell, MD;  Location: [facility];  Service:  Cardiovascular;  Laterality: N/A;   RADIOLOGY WITH ANESTHESIA N/A 05/15/2021   Procedure: IR WITH ANESTHESIA;  Surgeon: Radiologist, Medication, MD;  Location: [place];  Service: Radiology;  Laterality: N/A;   Unknown       OB History   No obstetric history on file.     Family History  Problem Relation Age of Onset   Dementia Mother     Social History   Tobacco Use   Smoking status: Every Day   Smokeless tobacco: Never  Vaping Use   Vaping Use: Never used  Substance Use Topics   Alcohol use: Not Currently    Comment: Occasionally   Drug use: Not Currently    Types: Marijuana    Home Medications Prior to Admission medications   Medication Sig Start Date End Date Taking? Authorizing Provider  acetaminophen (TYLENOL) 500 MG tablet Take 2 tablets (1,000 mg total) by mouth every 6 (six) hours. 10/27/21   France Ravens, MD  Amino Acids-Protein Hydrolys (FEEDING SUPPLEMENT, PRO-STAT SUGAR FREE 64,) LIQD Take 30 mLs by mouth 2 (two) times daily.    [provider]  amiodarone (PACERONE) 200 MG tablet Take 200 mg by mouth daily. 02/02/21   [provider]  aspirin 81 MG chewable tablet Chew 1 tablet (81 mg total) by mouth daily. 07/04/21   Samella Parr, NP  atorvastatin (LIPITOR) 40 MG tablet Take 1 tablet (40 mg total) by mouth daily. 07/04/21   Samella Parr, NP  baclofen (LIORESAL) 20 MG tablet Take 1 tablet (20 mg total) by mouth 3 (three) times daily. 10/27/21   France Ravens, MD  bisacodyl (DULCOLAX) 5 MG EC tablet Take 2 tablets (10 mg total) by mouth daily. 10/27/21   France Ravens, MD  carvedilol (COREG) 25 MG tablet Take 1 tablet (25 mg total) by mouth 2 (two) times daily with a meal. 07/03/21   Samella Parr, NP  cloNIDine (CATAPRES) 0.1 MG tablet Take 1 tablet (0.1 mg total) by mouth 3 (three) times daily. 07/03/21   Samella Parr, NP  escitalopram (LEXAPRO) 10 MG tablet Take 15 mg by mouth daily.    [provider]  feeding supplement (ENSURE ENLIVE  / ENSURE PLUS) LIQD Take 237 mLs by mouth 3 (three) times daily between meals. 07/03/21   Samella Parr, NP  ferrous sulfate 325 (65 FE) MG tablet Take 1 tablet (325 mg total) by mouth 2 (two) times daily with a meal. 02/20/21   Lyda Jester M, PA-C  hydrALAZINE (APRESOLINE) 100 MG tablet Take 1 tablet (100 mg total) by mouth 3 (three) times daily. 03/02/21   Bensimhon, Shaune Pascal, MD  isosorbide mononitrate (IMDUR) 60 MG 24 hr tablet Take 60 mg by mouth daily. 02/02/21   [provider]  lactulose (CHRONULAC) 10 GM/15ML solution Take 15 mLs (10 g total) by mouth 2 (two) times daily. 07/03/21   Samella Parr, NP  linaclotide [facility]) 145 MCG CAPS capsule Take 145  mcg by mouth daily before breakfast.    [provider]  Multiple Vitamin (MULTIVITAMIN WITH MINERALS) TABS tablet Take 1 tablet by mouth daily. 07/04/21   Samella Parr, NP  pantoprazole (PROTONIX) 40 MG tablet Take 40 mg by mouth daily.    [provider]  polyethylene glycol (MIRALAX / GLYCOLAX) 17 g packet Take 17 g by mouth daily. 10/27/21   France Ravens, MD  sacubitril-valsartan (ENTRESTO) 97-103 MG Take 1 tablet by mouth 2 (two) times daily. 02/20/21   Lyda Jester M, PA-C  senna-docusate (SENOKOT-S) 8.6-50 MG tablet Take 2 tablets by mouth 2 (two) times daily. 10/27/21 11/26/21  France Ravens, MD  simethicone (MYLICON) 40 XB/1.4NW drops Take 0.6 mLs (40 mg total) by mouth every 6 (six) hours as needed for flatulence. 07/03/21   Samella Parr, NP  torsemide (DEMADEX) 20 MG tablet TAKE 2 TABLETS (40 MG TOTAL) BY MOUTH 2 (TWO) TIMES DAILY. Patient not taking: Reported on 10/10/2021 03/02/21 03/02/22  Bensimhon, Shaune Pascal, MD    Allergies    Patient has no known allergies.  Review of Systems   Review of Systems  Unable to perform ROS: Patient nonverbal  Constitutional:  Negative for chills and fever.  Neurological:  Positive for headaches.  All other systems reviewed and are negative.  Physical  Exam Updated Vital Signs BP (!) 158/108 (BP Location: Right Arm)   Pulse 81   Temp 98.7 F (37.1 C) (Axillary)   Resp 19   Ht 5\' 5"  (1.651 m)   Wt 63.9 kg   LMP  (LMP Unknown)   SpO2 96%   BMI 23.44 kg/m   Physical Exam Vitals and nursing note reviewed.  Constitutional:      Comments: Patient alert, able to answer questions with head nodding, ill-appearing lying in bed  HENT:     Head: Normocephalic and atraumatic.     Mouth/Throat:     Comments: Some right-sided facial droop noted with copious amounts of saliva dripping from the corner of the right mouth Eyes:     Extraocular Movements: Extraocular movements intact.     Pupils: Pupils are equal, round, and reactive to light.  Cardiovascular:     Rate and Rhythm: Normal rate and regular rhythm.     Heart sounds: Normal heart sounds.  Pulmonary:     Effort: Pulmonary effort is normal. No respiratory distress.     Breath sounds: Normal breath sounds.  Abdominal:     General: Abdomen is flat.     Palpations: Abdomen is soft.  Skin:    General: Skin is warm and dry.  Neurological:     Comments: Some right-sided facial droop noted with copious amounts of saliva dripping from the corner of the right mouth  Patient able to lift her right arm on command and squeeze her right hand with 2/5 strength, able to smile and raise both eyebrows equally.  Able to wiggle toes on the right side, unable to lift her right leg.   Left side flaccid per baseline.   Psychiatric:        Mood and Affect: Mood normal.        Behavior: Behavior normal.    ED Results / Procedures / Treatments   Labs (all labs ordered are listed, but only abnormal results are displayed) Labs Reviewed  RESP PANEL BY RT-PCR (FLU A&B, COVID) ARPGX2  CBC WITH DIFFERENTIAL/PLATELET  COMPREHENSIVE METABOLIC PANEL  URINALYSIS, ROUTINE W REFLEX MICROSCOPIC  I-STAT BETA HCG BLOOD, ED ([facility],  [facility], [facility] ONLY)  TROPONIN I (HIGH SENSITIVITY)    EKG None  Radiology No  results found.  Procedures Procedures   Medications Ordered in ED Medications  fentaNYL (SUBLIMAZE) injection 25 mcg (has no administration in time range)  0.9 %  sodium chloride infusion (has no administration in time range)    ED Course  I have reviewed the triage vital signs and the nursing notes.  Pertinent labs & imaging results that were available during my care of the patient were reviewed by me and considered in my medical decision making (see chart for details).    MDM Rules/Calculators/A&P                         Patient presents from her facility for new onset aphasia since 2300 last night. She is able to nod her head to answer questions.  Complaining of head chest and abdominal pain.  History of hemorrhagic stroke in May 2022.  She is also hypertensive.  Patient is out of stroke window given 24 hours since last known normal.  Labs and imaging ordered and pending at shift change. Suspect regardless patient will require neuro consult and admission for new onset aphasia.  Care handoff to Addison Lank, MD at shift change. Please see their not for dispo.   Final Clinical Impression(s) / ED Diagnoses Final diagnoses:  Aphasia    Rx / DC Orders ED Discharge Orders     None        Nestor Lewandowsky 11/22/21 1938    Fatima Blank, MD 11/23/21 [phone]

## 2021-11-21 NOTE — ED Triage Notes (Signed)
[facility]  Reportedly the pt has been aphaic since last pm at 2300 and her bp is high.. for some reason they waited until 2200 tonight for the same

## 2021-11-21 NOTE — Telephone Encounter (Signed)
Pt missed her new hem appt with Dr. Lorenso Courier. I called [facility] as they coordinate the pt's appts. They are aware of new appt date and time for pt.

## 2021-11-22 ENCOUNTER — Observation Stay (HOSPITAL_COMMUNITY): Payer: Medicaid Other

## 2021-11-22 DIAGNOSIS — R2990 Unspecified symptoms and signs involving the nervous system: Secondary | ICD-10-CM | POA: Diagnosis not present

## 2021-11-22 DIAGNOSIS — R4701 Aphasia: Secondary | ICD-10-CM | POA: Diagnosis not present

## 2021-11-22 DIAGNOSIS — R531 Weakness: Secondary | ICD-10-CM | POA: Diagnosis not present

## 2021-11-22 LAB — TROPONIN I (HIGH SENSITIVITY)
Troponin I (High Sensitivity): 101 ng/L (ref <18)
Troponin I (High Sensitivity): 111 ng/L (ref <18)
Troponin I (High Sensitivity): 118 ng/L (ref <18)
Troponin I (High Sensitivity): 90 ng/L — ABNORMAL HIGH (ref <18)

## 2021-11-22 LAB — COMPREHENSIVE METABOLIC PANEL
ALT: 16 U/L (ref 0–44)
AST: 20 U/L (ref 15–41)
Albumin: 3.4 g/dL — ABNORMAL LOW (ref 3.5–5.0)
Alkaline Phosphatase: 215 U/L — ABNORMAL HIGH (ref 38–126)
Anion gap: 12 (ref 5–15)
BUN: 60 mg/dL — ABNORMAL HIGH (ref 6–20)
CO2: 28 mmol/L (ref 22–32)
Calcium: 11.3 mg/dL — ABNORMAL HIGH (ref 8.9–10.3)
Chloride: 103 mmol/L (ref 98–111)
Creatinine, Ser: 1.33 mg/dL — ABNORMAL HIGH (ref 0.44–1.00)
GFR, Estimated: 50 mL/min — ABNORMAL LOW (ref >60)
Glucose, Bld: 93 mg/dL (ref 70–99)
Potassium: 3.1 mmol/L — ABNORMAL LOW (ref 3.5–5.1)
Sodium: 143 mmol/L (ref 135–145)
Total Bilirubin: 0.9 mg/dL (ref 0.3–1.2)
Total Protein: 8.7 g/dL — ABNORMAL HIGH (ref 6.5–8.1)

## 2021-11-22 LAB — CBC
HCT: 31.3 % — ABNORMAL LOW (ref 36.0–46.0)
Hemoglobin: 9.2 g/dL — ABNORMAL LOW (ref 12.0–15.0)
MCH: 25 pg — ABNORMAL LOW (ref 26.0–34.0)
MCHC: 29.4 g/dL — ABNORMAL LOW (ref 30.0–36.0)
MCV: 85.1 fL (ref 80.0–100.0)
Platelets: 431 10*3/uL — ABNORMAL HIGH (ref 150–400)
RBC: 3.68 MIL/uL — ABNORMAL LOW (ref 3.87–5.11)
RDW: 24.5 % — ABNORMAL HIGH (ref 11.5–15.5)
WBC: 10.4 10*3/uL (ref 4.0–10.5)
nRBC: 0 % (ref 0.0–0.2)

## 2021-11-22 LAB — RESP PANEL BY RT-PCR (FLU A&B, COVID) ARPGX2
Influenza A by PCR: NEGATIVE
Influenza B by PCR: NEGATIVE
SARS Coronavirus 2 by RT PCR: POSITIVE — AB

## 2021-11-22 LAB — BASIC METABOLIC PANEL
Anion gap: 11 (ref 5–15)
BUN: 58 mg/dL — ABNORMAL HIGH (ref 6–20)
CO2: 26 mmol/L (ref 22–32)
Calcium: 11.2 mg/dL — ABNORMAL HIGH (ref 8.9–10.3)
Chloride: 107 mmol/L (ref 98–111)
Creatinine, Ser: 1.15 mg/dL — ABNORMAL HIGH (ref 0.44–1.00)
GFR, Estimated: 59 mL/min — ABNORMAL LOW (ref >60)
Glucose, Bld: 87 mg/dL (ref 70–99)
Potassium: 3.1 mmol/L — ABNORMAL LOW (ref 3.5–5.1)
Sodium: 144 mmol/L (ref 135–145)

## 2021-11-22 LAB — CBC WITH DIFFERENTIAL/PLATELET
Abs Immature Granulocytes: 0.03 10*3/uL (ref 0.00–0.07)
Basophils Absolute: 0 10*3/uL (ref 0.0–0.1)
Basophils Relative: 0 %
Eosinophils Absolute: 0.1 10*3/uL (ref 0.0–0.5)
Eosinophils Relative: 1 %
HCT: 32.4 % — ABNORMAL LOW (ref 36.0–46.0)
Hemoglobin: 9.8 g/dL — ABNORMAL LOW (ref 12.0–15.0)
Immature Granulocytes: 0 %
Lymphocytes Relative: 10 %
Lymphs Abs: 1 10*3/uL (ref 0.7–4.0)
MCH: 25.5 pg — ABNORMAL LOW (ref 26.0–34.0)
MCHC: 30.2 g/dL (ref 30.0–36.0)
MCV: 84.2 fL (ref 80.0–100.0)
Monocytes Absolute: 0.6 10*3/uL (ref 0.1–1.0)
Monocytes Relative: 6 %
Neutro Abs: 8.1 10*3/uL — ABNORMAL HIGH (ref 1.7–7.7)
Neutrophils Relative %: 83 %
Platelets: 445 10*3/uL — ABNORMAL HIGH (ref 150–400)
RBC: 3.85 MIL/uL — ABNORMAL LOW (ref 3.87–5.11)
RDW: 24.5 % — ABNORMAL HIGH (ref 11.5–15.5)
WBC: 9.7 10*3/uL (ref 4.0–10.5)
nRBC: 0 % (ref 0.0–0.2)

## 2021-11-22 LAB — I-STAT BETA HCG BLOOD, ED (MC, WL, AP ONLY): I-stat hCG, quantitative: <5 m[IU]/mL (ref <5)

## 2021-11-22 LAB — MAGNESIUM: Magnesium: 2 mg/dL (ref 1.7–2.4)

## 2021-11-22 IMAGING — MR MR HEAD W/O CM
14 of 15 series · 45 of 48 positions shown · non-contrast
Comparison: Head CT yesterday.  MRI [DATE].

CLINICAL DATA: Neuro deficit, acute, stroke suspected. Previous
stroke with hemiparesis.

EXAM:
MRI HEAD WITHOUT CONTRAST
TECHNIQUE: Multiplanar, multiecho pulse sequences of the brain and surrounding
structures were obtained without intravenous contrast.

[Series 5: DWI · axial · 3.0mm · 0.88mm/px · z∈[-142,-8]mm · 7 of 96 slices shown (1 of 6)]
[im 1/96]
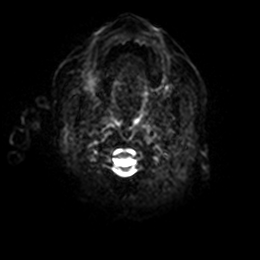
[im 16/96]
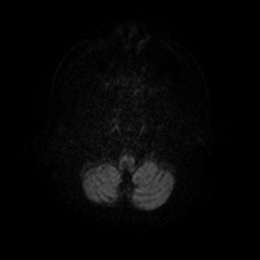
[im 32/96]
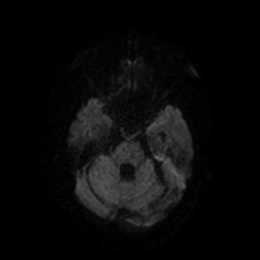
[im 48/96]
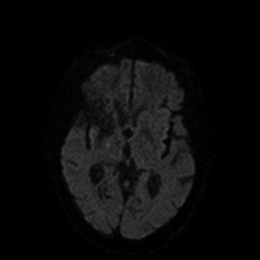
[im 64/96]
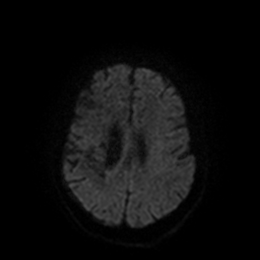
[im 80/96]
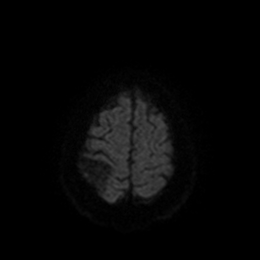
[im 96/96]
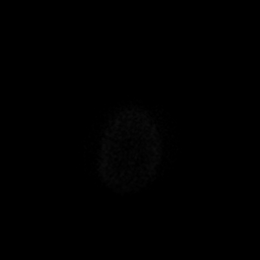

[Series 6: DWI · axial · 3.0mm · 0.88mm/px · z∈[-142,-8]mm · 4 of 48 slices shown (2 of 6)]
[im 1/48]
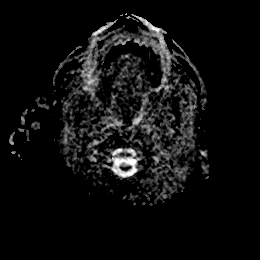
[im 16/48]
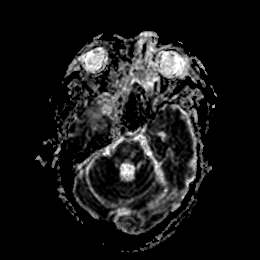
[im 32/48]
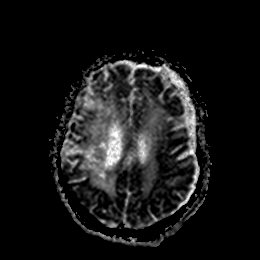
[im 48/48]
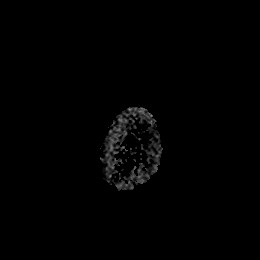

[Series 7: DWI · coronal · 4.0mm · 0.88mm/px · 4 of 68 slices shown (3 of 6)]
[im 1/68]
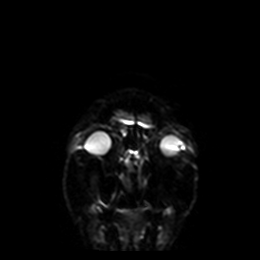
[im 23/68]
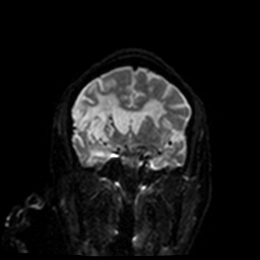
[im 45/68]
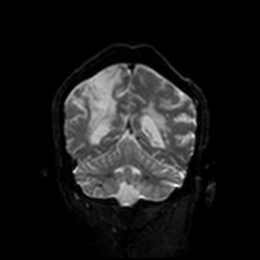
[im 68/68]
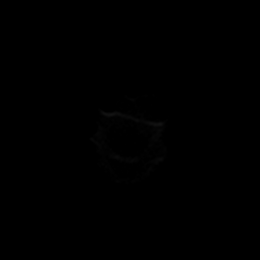

[Series 8: DWI · coronal · 4.0mm · 0.88mm/px · 2 of 34 slices shown (4 of 6)]
[im 1/34]
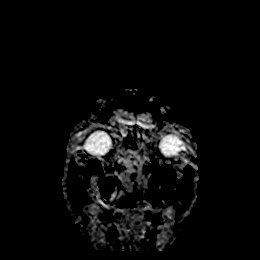
[im 34/34]
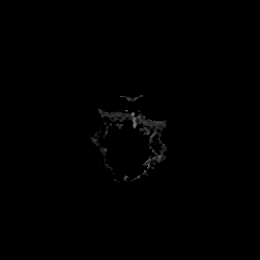

[Series 9: FLAIR · axial · 5.0mm · 0.45mm/px · z∈[-140,-3]mm · 2 of 25 slices shown]
[im 1/25]
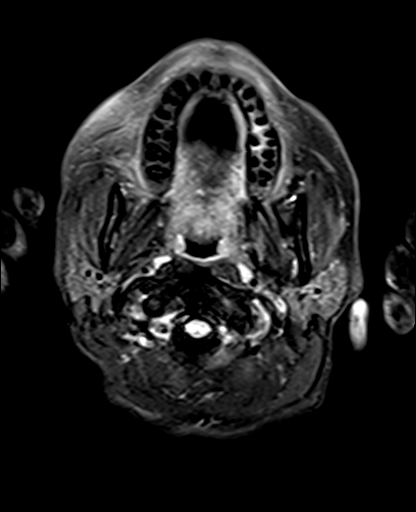
[im 25/25]
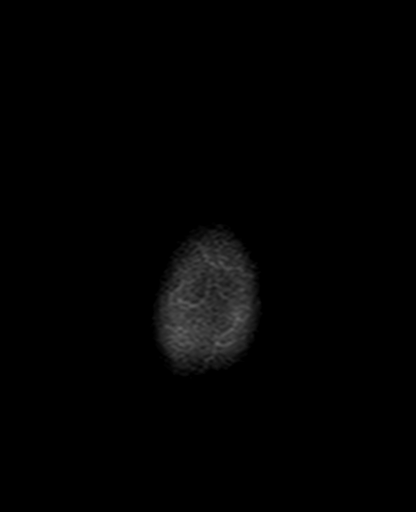

[Series 10: T2 · axial · 5.0mm · 0.72mm/px · z∈[-143,-7]mm · 2 of 25 slices shown (1 of 2)]
[im 1/25]
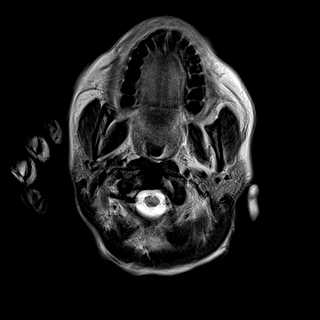
[im 25/25]
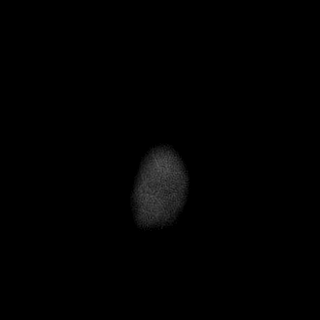

[Series 11: mag_images · axial · 3.0mm · 0.90mm/px · z∈[-144,+2]mm · 3 of 52 slices shown]
[im 1/52]
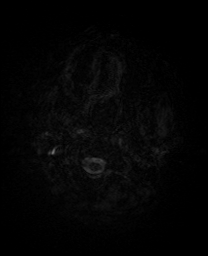
[im 26/52]
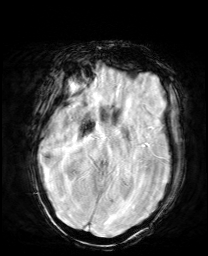
[im 52/52]
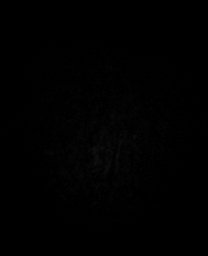

[Series 12: pha_images · axial · 3.0mm · 0.90mm/px · z∈[-144,+2]mm · 3 of 52 slices shown]
[im 1/52]
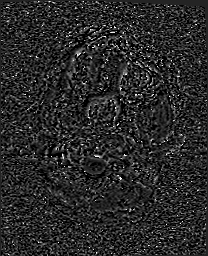
[im 26/52]
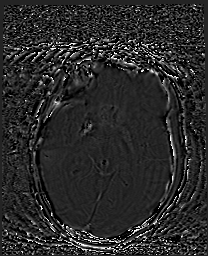
[im 52/52]
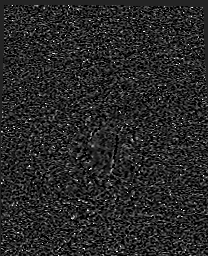

[Series 13: swi_images · axial · 3.0mm · 0.90mm/px · z∈[-144,+2]mm · 3 of 52 slices shown]
[im 1/52]
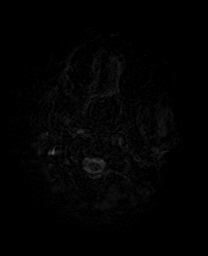
[im 26/52]
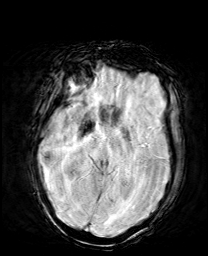
[im 52/52]
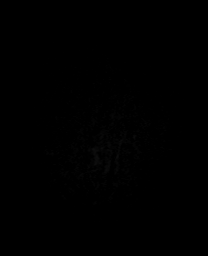

[Series 14: mip_images(sw) · axial · 24.0mm · 0.90mm/px · z∈[-134,-8]mm · 3 of 45 slices shown]
[im 1/45]
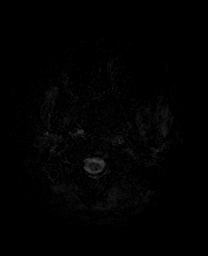
[im 23/45]
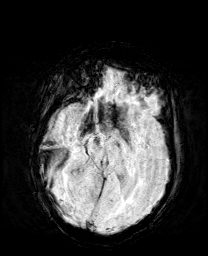
[im 45/45]
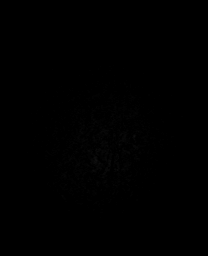

[Series 15: T1 · sagittal · 5.0mm · 0.75mm/px · 1 of 22 slices shown]
[im 1/22]
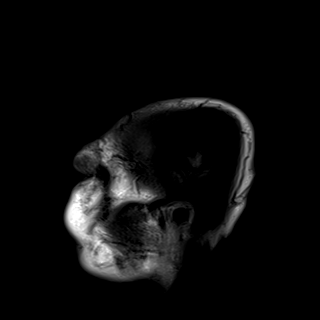

[Series 17: T2 · coronal · 5.0mm · 0.34mm/px · 2 of 29 slices shown (2 of 2)]
[im 1/29]
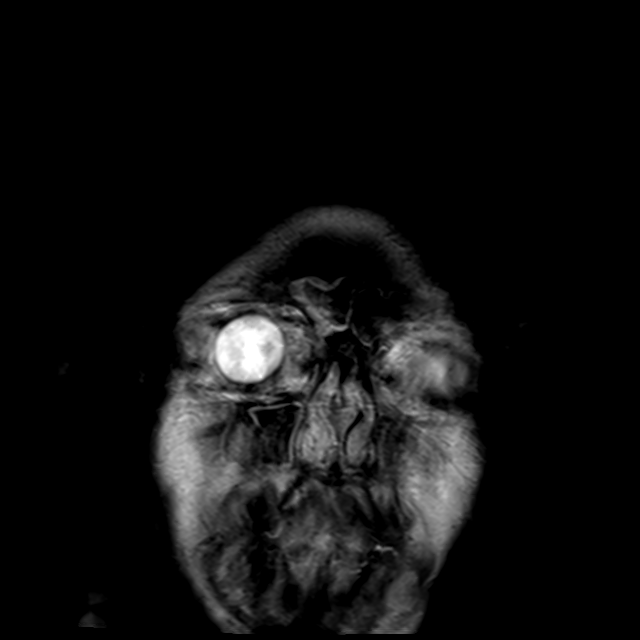
[im 29/29]
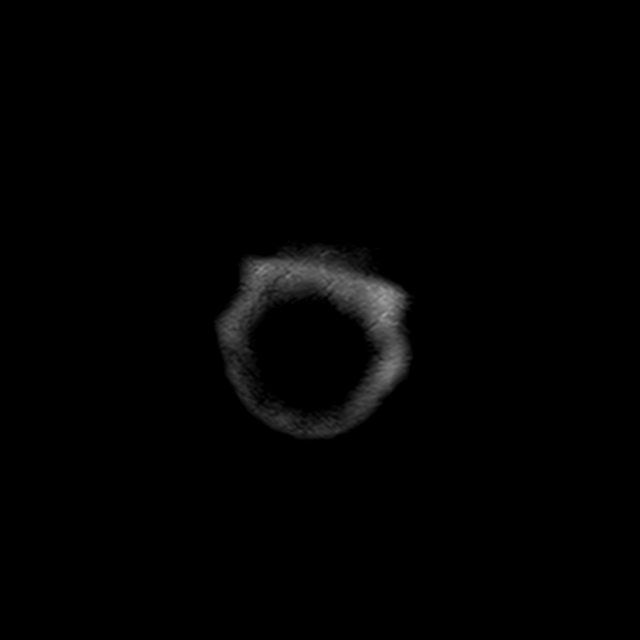

[Series 18: DWI · axial · 3.0mm · 0.88mm/px · z∈[-142,-8]mm · 6 of 96 slices shown (5 of 6)]
[im 1/96]
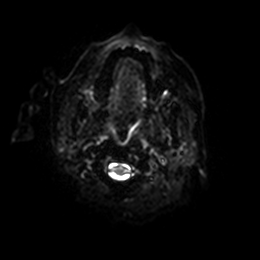
[im 20/96]
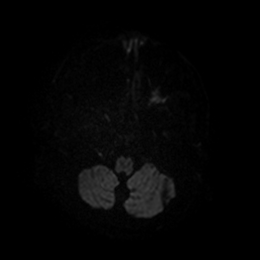
[im 39/96]
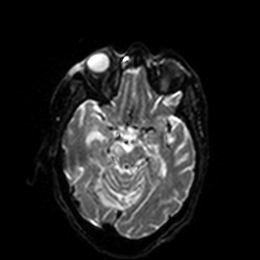
[im 58/96]
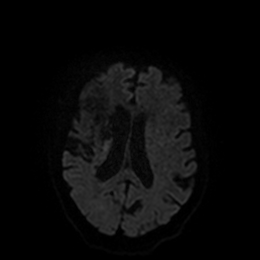
[im 77/96]
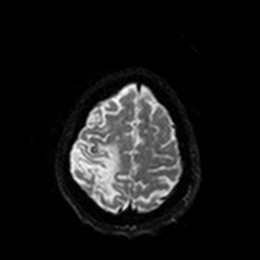
[im 96/96]
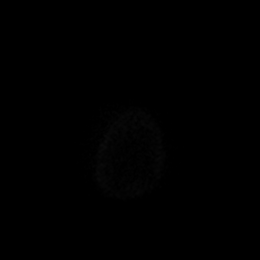

[Series 19: DWI · axial · 3.0mm · 0.88mm/px · z∈[-142,-8]mm · 3 of 48 slices shown (6 of 6)]
[im 1/48]
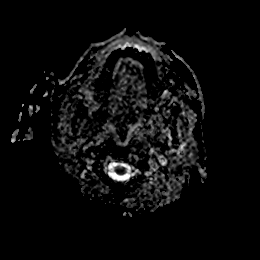
[im 24/48]
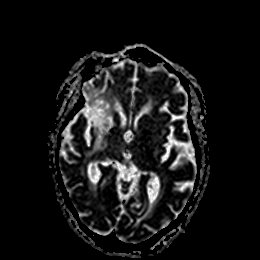
[im 48/48]
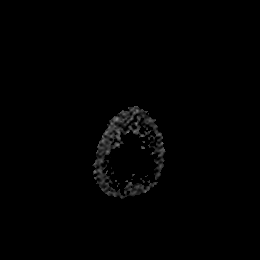

[45 of 48 positions shown; findings below may reference images not displayed]

FINDINGS: Brain: Diffusion imaging does not show any acute or subacute
infarction. There are extensive chronic small-vessel ischemic
changes throughout the pons. There are old cerebellar infarctions,
larger on the left than the right. Small focus of hemosiderin
deposition on the right. Left cerebral hemisphere shows extensive
chronic small-vessel ischemic changes with old lacunar infarctions
in the basal ganglia. Right hemisphere shows old infarction in the
right middle cerebral artery territory which has progressed to
atrophy, encephalomalacia and gliosis. Hemosiderin deposition
throughout that region. Chronic small-vessel ischemic change of the
white matter otherwise. Mild ex vacuo enlargement of the right
lateral ventricle. No suspicion of obstructive hydrocephalus. No
extra-axial collection.

Vascular: Major vessels at the base of the brain show flow.

Skull and upper cervical spine: Negative

Sinuses/Orbits: Mucosal inflammatory changes of the paranasal
sinuses. Orbits negative.

Other: None
IMPRESSION: No acute finding by MRI. Old right middle cerebral artery territory
stroke with atrophy, encephalomalacia and gliosis. Hemosiderin
deposition in that region. Extensive chronic small-vessel ischemic
changes elsewhere affecting the brainstem, cerebellum and
hemispheric white matter bilaterally. Old lacunar infarctions left
basal ganglia.

## 2021-11-22 IMAGING — DX DG ABDOMEN 1V
1 series · 1 of 1 positions shown · non-contrast
Comparison: [DATE]

CLINICAL DATA: Abdominal pain.

EXAM:
ABDOMEN - 1 VIEW

[abdomen]
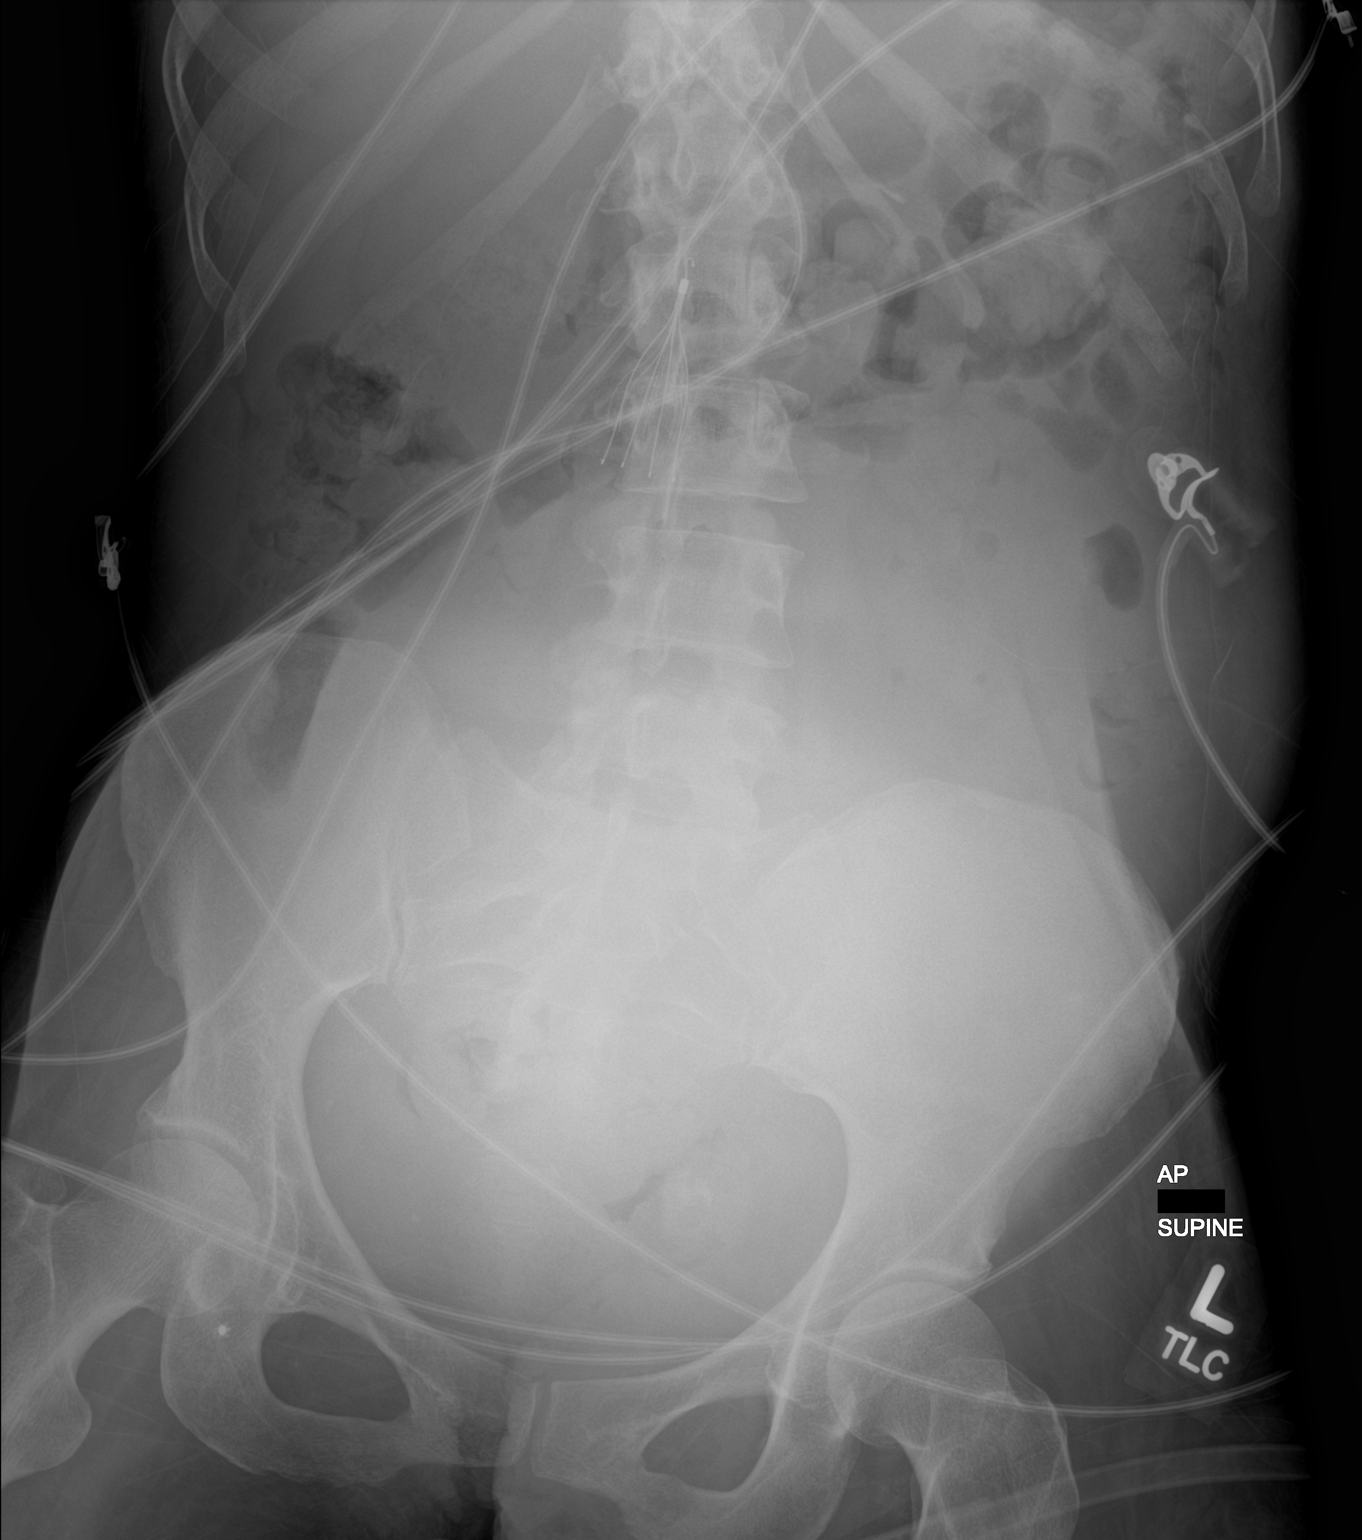

[1 of 1 positions shown; findings below may reference images not displayed]

FINDINGS: No evidence for gaseous small bowel dilatation to suggest
obstruction. As before, formed stool is seen in the transverse and
left colon. IVC filter is new in the interval. Visualized bony
anatomy unremarkable.
IMPRESSION: Nonobstructive bowel gas pattern with prominent stool volume in the
left colon.

## 2021-11-22 MED ORDER — ACETAMINOPHEN 325 MG PO TABS
650.0000 mg | ORAL_TABLET | Freq: Four times a day (QID) | ORAL | Status: DC | PRN
  Filled 2021-11-22: qty 2

## 2021-11-22 MED ORDER — ATORVASTATIN CALCIUM 40 MG PO TABS
40.0000 mg | ORAL_TABLET | Freq: Every day | ORAL | Status: DC
  Administered 2021-11-23 – 2021-11-24 (×2): 40 mg via ORAL
  Filled 2021-11-22 (×2): qty 1

## 2021-11-22 MED ORDER — ENOXAPARIN SODIUM 30 MG/0.3ML IJ SOSY
30.0000 mg | PREFILLED_SYRINGE | INTRAMUSCULAR | Status: DC
Start: 2021-11-22 — End: 2021-11-22

## 2021-11-22 MED ORDER — SODIUM CHLORIDE 0.9 % IV BOLUS
1000.0000 mL | Freq: Once | INTRAVENOUS | Status: AC
  Administered 2021-11-22: 1000 mL via INTRAVENOUS

## 2021-11-22 MED ORDER — ASPIRIN 81 MG PO CHEW: 81.0000 mg | CHEWABLE_TABLET | Freq: Every day | ORAL | Status: DC

## 2021-11-22 MED ORDER — NITROGLYCERIN 2 % TD OINT
1.0000 [in_us] | TOPICAL_OINTMENT | Freq: Once | TRANSDERMAL | Status: AC
  Administered 2021-11-22: 1 [in_us] via TOPICAL
  Filled 2021-11-22: qty 1

## 2021-11-22 MED ORDER — HYDROMORPHONE HCL 1 MG/ML IJ SOLN
2.0000 mg | Freq: Once | INTRAMUSCULAR | Status: AC
  Administered 2021-11-22: 2 mg via INTRAVENOUS
  Filled 2021-11-22: qty 2

## 2021-11-22 MED ORDER — ENOXAPARIN SODIUM 40 MG/0.4ML IJ SOSY
40.0000 mg | PREFILLED_SYRINGE | INTRAMUSCULAR | Status: DC
  Administered 2021-11-23 – 2021-11-24 (×2): 40 mg via SUBCUTANEOUS
  Filled 2021-11-22 (×2): qty 0.4

## 2021-11-22 MED ORDER — MIRTAZAPINE 15 MG PO TABS
7.5000 mg | ORAL_TABLET | Freq: Every day | ORAL | Status: DC
  Administered 2021-11-22 – 2021-11-23 (×2): 7.5 mg via ORAL
  Filled 2021-11-22 (×3): qty 1

## 2021-11-22 MED ORDER — POTASSIUM CHLORIDE 10 MEQ/100ML IV SOLN
10.0000 meq | INTRAVENOUS | Status: DC
  Administered 2021-11-22 (×2): 10 meq via INTRAVENOUS
  Filled 2021-11-22 (×2): qty 100

## 2021-11-22 MED ORDER — POLYETHYLENE GLYCOL 3350 17 G PO PACK
17.0000 g | PACK | Freq: Two times a day (BID) | ORAL | Status: DC
Start: 2021-11-22 — End: 2021-11-25
  Administered 2021-11-22 – 2021-11-23 (×2): 17 g via ORAL
  Filled 2021-11-22 (×3): qty 1

## 2021-11-22 MED ORDER — ACETAMINOPHEN 650 MG RE SUPP: 650.0000 mg | Freq: Four times a day (QID) | RECTAL | Status: DC | PRN

## 2021-11-22 MED ORDER — HYDROMORPHONE HCL 1 MG/ML PO LIQD: 2.0000 mg | Freq: Once | ORAL | Status: DC

## 2021-11-22 MED ORDER — PANTOPRAZOLE SODIUM 40 MG PO TBEC: 40.0000 mg | DELAYED_RELEASE_TABLET | Freq: Every day | ORAL | Status: DC

## 2021-11-22 MED ORDER — ESCITALOPRAM OXALATE 10 MG PO TABS
20.0000 mg | ORAL_TABLET | Freq: Every day | ORAL | Status: DC
  Administered 2021-11-23 – 2021-11-24 (×2): 20 mg via ORAL
  Filled 2021-11-22 (×2): qty 2

## 2021-11-22 MED ORDER — HYDROCODONE-ACETAMINOPHEN 5-325 MG PO TABS
1.0000 | ORAL_TABLET | ORAL | Status: DC | PRN
  Administered 2021-11-23 – 2021-11-24 (×4): 2 via ORAL
  Filled 2021-11-22 (×4): qty 2

## 2021-11-22 MED ORDER — LORAZEPAM 2 MG/ML IJ SOLN
0.5000 mg | Freq: Once | INTRAMUSCULAR | Status: DC
  Filled 2021-11-22: qty 1

## 2021-11-22 MED ORDER — AMIODARONE HCL 200 MG PO TABS
200.0000 mg | ORAL_TABLET | Freq: Every day | ORAL | Status: DC
  Administered 2021-11-23 – 2021-11-24 (×2): 200 mg via ORAL
  Filled 2021-11-22 (×2): qty 1

## 2021-11-22 MED ORDER — POTASSIUM CHLORIDE 10 MEQ/100ML IV SOLN
10.0000 meq | INTRAVENOUS | Status: AC
  Administered 2021-11-22 (×3): 10 meq via INTRAVENOUS
  Filled 2021-11-22: qty 100

## 2021-11-22 MED ORDER — PANTOPRAZOLE SODIUM 40 MG IV SOLR
40.0000 mg | INTRAVENOUS | Status: DC
  Administered 2021-11-23: 40 mg via INTRAVENOUS
  Filled 2021-11-22: qty 40

## 2021-11-22 MED ORDER — ASPIRIN 300 MG RE SUPP: 150.0000 mg | Freq: Every day | RECTAL | Status: DC

## 2021-11-22 NOTE — ED Notes (Signed)
Pt in MRI when this RN received report and took over care.

## 2021-11-22 NOTE — Evaluation (Signed)
Occupational Therapy Evaluation Patient Details Name: Holly Bradley MRN: [id number] DOB: 03-Feb-1976 Today's Date: 11/22/2021   History of Present Illness This 45 y.o. female admitted with new onset aphasia and new weakness.  CT of head showed no acute intracranial findings, but advanced small vessel disease and old Rt MCA infarct; chronic infarct in the superior left cerebellum, lacunar infarcts Lt gangliocapsular area.  MRI pending.  PMH includes: CHF, chronic blood loss anemia, Fibroid, essential HTN, paroxysmal SVT, h/o CVA, s/p placement IVC filter   Clinical Impression   Pt admitted with above. She demonstrates the below listed deficits and will benefit from continued OT to maximize safety and independence with BADLs.  Pt presents to OT with with weakness Rt UE, impaired cognition, chronic Lt hemiplegia.  She resides at [facility], and was dependent with all ADLs except for self feeding.  OT will follow pt acutely to assist her with return to baseline status.         Recommendations for follow up therapy are one component of a multi-disciplinary discharge planning process, led by the attending physician.  Recommendations may be updated based on patient status, additional functional criteria and insurance authorization.   Follow Up Recommendations  Skilled nursing-short term rehab (<3 hours/day)    Assistance Recommended at Discharge Frequent or constant Supervision/Assistance  Functional Status Assessment  Patient has had a recent decline in their functional status and/or demonstrates limited ability to make significant improvements in function in a reasonable and predictable amount of time  Equipment Recommendations  None recommended by OT    Recommendations for Other Services       Precautions / Restrictions Precautions Precautions: Fall Precaution Comments: Lt hemiplegia      Mobility Bed Mobility                    Transfers                           Balance                                           ADL either performed or assessed with clinical judgement   ADL Overall ADL's : Needs assistance/impaired Eating/Feeding: Moderate assistance;Bed level                                   Functional mobility during ADLs: Total assistance       Vision   Additional Comments: pt able to track me across the room.  She is able to use call button to call nurse     Perception     Praxis      Pertinent Vitals/Pain Pain Assessment: No/denies pain     Hand Dominance Right   Extremity/Trunk Assessment Upper Extremity Assessment Upper Extremity Assessment: RUE deficits/detail;LUE deficits/detail RUE Deficits / Details: Rt shoulder 2-/5.  PROM WFL.  elbow grossly 3+/5, hand 3+/5 RUE Coordination: decreased gross motor;decreased fine motor LUE Deficits / Details: Pt with chronic Lt hemiplegia.  No volitional movement noted.  achieved ~80% finger flexion, wrist to neutral, elbow WFL, shoulder ~75* passively LUE Coordination: decreased fine motor;decreased gross motor           Communication Communication Communication: Expressive difficulties   Cognition Arousal/Alertness: Awake/alert Behavior During Therapy: WFL for tasks assessed/performed  Overall Cognitive Status: No family/caregiver present to determine baseline cognitive functioning                                       General Comments       Exercises Exercises: Other exercises Other Exercises Other Exercises: Pt performed 10 reps AAROM Rt shoulder and 10 reps AROM elbow flexion/ext   Shoulder Instructions      Home Living Family/patient expects to be discharged to:: Skilled nursing facility   Available Help at Discharge: [place] Type of Home: [place]                                  Prior Functioning/Environment Prior Level of Function : Needs assist              Mobility Comments: Pt reliant on hoyer lift for transfers ADLs Comments: Pt requires assist for all ADLs except self feeding        OT Problem List: Decreased strength;Decreased range of motion;Decreased activity tolerance;Decreased cognition;Decreased knowledge of use of DME or AE;Impaired UE functional use      OT Treatment/Interventions: Self-care/ADL training;DME and/or AE instruction;Manual therapy;Splinting;Therapeutic activities;Neuromuscular education;Therapeutic exercise    OT Goals(Current goals can be found in the care plan section) Acute Rehab OT Goals Patient Stated Goal: did not state OT Goal Formulation: With patient Time For Goal Achievement: 12/06/21 Potential to Achieve Goals: Fair ADL Goals Pt Will Perform Eating: with set-up;with supervision;sitting;bed level  OT Frequency: Min 1X/week   Barriers to D/C:            Co-evaluation              AM-PAC OT "6 Clicks" Daily Activity     Outcome Measure Help from another person eating meals?: A Lot Help from another person taking care of personal grooming?: Total Help from another person toileting, which includes using toliet, bedpan, or urinal?: Total Help from another person bathing (including washing, rinsing, drying)?: Total Help from another person to put on and taking off regular upper body clothing?: Total Help from another person to put on and taking off regular lower body clothing?: Total 6 Click Score: 7   End of Session Nurse Communication: Mobility status  Activity Tolerance: Patient tolerated treatment well Patient left: in bed;with call bell/phone within reach  OT Visit Diagnosis: Hemiplegia and hemiparesis;Cognitive communication deficit (R41.841) Symptoms and signs involving cognitive functions: Cerebral infarction Hemiplegia - Right/Left: Left Hemiplegia - dominant/non-dominant: Non-Dominant Hemiplegia - caused by: Cerebral infarction                Time: 1250-1302 OT Time  Calculation (min): 12 min Charges:  OT General Charges $OT Visit: 1 Visit OT Evaluation $OT Eval Moderate Complexity: 1 Mod  Zhara Gieske C., OTR/L Acute Rehabilitation Services Pager [phone] Office [phone]   Lucille Passy M 11/22/2021, 1:22 PM

## 2021-11-22 NOTE — ED Notes (Signed)
Admitting doctors at the bedside 

## 2021-11-22 NOTE — Progress Notes (Signed)
EEG complete - results pending 

## 2021-11-22 NOTE — Progress Notes (Signed)
PT Cancellation Note  Patient Details Name: Holly Bradley MRN: [id number] DOB: [birth year]   Cancelled Treatment:    Reason Eval/Treat Not Completed: PT screened, no needs identified, will sign off. Per recent encounter pt is bed/chair bound at nursing facility and uses mechanical lift at baseline. Not appropriate for skilled PT.   [place] 11/22/2021, 12:13 PM [place] Pager [phone] Office [phone]

## 2021-11-22 NOTE — Consult Note (Signed)
Neurology Consultation  Reason for Consult: concern for new stroke vs worsening stroke symptoms, speech difficulty which is new per report Referring Physician:  Dr Leonette Monarch  CC: new speech difficulty, concern for new stroke   History is obtained from: Chart review  HPI: Holly Bradley is a 45 y.o. 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 According to report, patient has left-sided hemiplegia but last known well the night of 11/20/2021 around 11 PM-noted the next morning to be having difficulty talking and drooling from the mouth which is not her norm.  EMS was called the night of 11/21/2021 to bring the patient to the emergency room.  She was lethargic, drooling from the mouth and appeared aphasic on initial exam.  She was nearly 24 hours outside the last known well and history was unclear hence a code stroke not activated.Her preliminary laboratory work-up revealed mild hypokalemia, dehydration with deranged renal function, elevated alkaline phosphatase, no evidence of leukocytosis Also reported she has been hypotensive all day at the facility.  At baseline, able to feed herself but otherwise is bedbound Neurology consultation obtained for concern for new stroke symptoms.   LKW: Unclear-to the best of chart review sometime around night of 11/20/2021 tpa given?: no, outside the window Premorbid modified Rankin scale (mRS):4-5   ROS: Unable to obtain due to altered mental status.   Past Medical History:  Diagnosis Date   CHF (congestive  heart failure) (HCC)    Chronic blood loss anemia    Related to uterine fibroids   Essential hypertension    Poorly controlled, repeat by report only on nicardipine 90 mg   Fibroid    Likely complicated by by menometrorrhagia and chronic anemia   Hypertension    Paroxysmal SVT (supraventricular tachycardia) (HCC)    Frequent episodes, can last anywhere from 20 minutes to 12-15 hours.  Not on beta-blocker or calcium channel blocker   Uterine fibroid    Family History  Problem Relation Age of Onset   Dementia Mother    Social History:   reports that she has been smoking. She has never used smokeless tobacco. She reports that she does not currently use alcohol. She reports that she does not currently use drugs after having used the following drugs: Marijuana.  Medications No current facility-administered medications for this encounter.  Current Outpatient Medications:    acetaminophen (TYLENOL) 500 MG tablet, Take 2 tablets (1,000 mg total) by mouth every 6 (six) hours., Disp: 30 tablet, Rfl: 0   Amino Acids-Protein Hydrolys (FEEDING SUPPLEMENT, PRO-STAT SUGAR FREE 64,) LIQD, Take 30 mLs by mouth 2 (two) times daily., Disp: , Rfl:    amiodarone (PACERONE) 200 MG tablet, Take 200 mg by mouth daily., Disp: , Rfl:    aspirin 81 MG chewable tablet, Chew 1 tablet (81 mg total) by mouth daily., Disp: , Rfl:    atorvastatin (LIPITOR) 40 MG tablet, Take 1 tablet (40 mg total) by mouth daily., Disp: , Rfl:    baclofen (LIORESAL) 20 MG tablet, Take 1 tablet (20 mg total) by mouth 3 (three) times daily., Disp: 30 each, Rfl: 0   bisacodyl (DULCOLAX) 5  MG EC tablet, Take 2 tablets (10 mg total) by mouth daily., Disp: 30 tablet, Rfl: 0   carvedilol (COREG) 25 MG tablet, Take 1 tablet (25 mg total) by mouth 2 (two) times daily with a meal., Disp: , Rfl:    cloNIDine (CATAPRES) 0.1 MG tablet, Take 1 tablet (0.1 mg total) by mouth 3 (three) times daily., Disp: 60 tablet, Rfl: 11   escitalopram (LEXAPRO)  10 MG tablet, Take 15 mg by mouth daily., Disp: , Rfl:    feeding supplement (ENSURE ENLIVE / ENSURE PLUS) LIQD, Take 237 mLs by mouth 3 (three) times daily between meals., Disp: 237 mL, Rfl: 12   ferrous sulfate 325 (65 FE) MG tablet, Take 1 tablet (325 mg total) by mouth 2 (two) times daily with a meal., Disp: 60 tablet, Rfl: 3   hydrALAZINE (APRESOLINE) 100 MG tablet, Take 1 tablet (100 mg total) by mouth 3 (three) times daily., Disp: 90 tablet, Rfl: 3   isosorbide mononitrate (IMDUR) 60 MG 24 hr tablet, Take 60 mg by mouth daily., Disp: , Rfl:    lactulose (CHRONULAC) 10 GM/15ML solution, Take 15 mLs (10 g total) by mouth 2 (two) times daily., Disp: 236 mL, Rfl: 0   linaclotide (LINZESS) 145 MCG CAPS capsule, Take 145 mcg by mouth daily before breakfast., Disp: , Rfl:    Multiple Vitamin (MULTIVITAMIN WITH MINERALS) TABS tablet, Take 1 tablet by mouth daily., Disp: , Rfl:    pantoprazole (PROTONIX) 40 MG tablet, Take 40 mg by mouth daily., Disp: , Rfl:    polyethylene glycol (MIRALAX / GLYCOLAX) 17 g packet, Take 17 g by mouth daily., Disp: 14 each, Rfl: 0   sacubitril-valsartan (ENTRESTO) 97-103 MG, Take 1 tablet by mouth 2 (two) times daily., Disp: 60 tablet, Rfl: 3   senna-docusate (SENOKOT-S) 8.6-50 MG tablet, Take 2 tablets by mouth 2 (two) times daily., Disp: 120 tablet, Rfl: 0   simethicone (MYLICON) 40 CV/8.9FY drops, Take 0.6 mLs (40 mg total) by mouth every 6 (six) hours as needed for flatulence., Disp: 30 mL, Rfl: 0   torsemide (DEMADEX) 20 MG tablet, TAKE 2 TABLETS (40 MG TOTAL) BY MOUTH 2 (TWO) TIMES DAILY. (Patient not taking: Reported on 10/10/2021), Disp: 120 tablet, Rfl: 2   Exam: Current vital signs: BP (!) 158/108 (BP Location: Right Arm)   Pulse 81   Temp 98.7 F (37.1 C) (Axillary)   Resp 19   Ht 5\' 5"  (1.651 m)   Wt 63.9 kg   LMP  (LMP Unknown)   SpO2 96%   BMI 23.44 kg/m  Vital signs in last 24 hours: Temp:  [98.7 F (37.1 C)] 98.7 F (37.1 C) (11/30  2223) Pulse Rate:  [81] 81 (11/30 2223) Resp:  [19] 19 (11/30 2223) BP: (158)/(108) 158/108 (11/30 2223) SpO2:  [96 %] 96 % (11/30 2223) Weight:  [63.9 kg] 63.9 kg (11/30 2223) General: Awake, alert, frail-appearing HEENT: Normocephalic, atraumatic Cardiovascular: Regular rate rhythm Abdomen nondistended nontender Extremities with low muscle mass on the left with increased tone Neurological exam She is awake, alert, oriented to self. She was able to tell me she is in the hospital Her speech is very slow and extremely dysarthric She was able to follow simple commands Cranial nerves: Pupils are equal round react light, extraocular movements intact, left homonymous hemianopsia, left lower facial weakness evident at rest with drooling from the right angle of the mouth today with intact right nasolabial fold. Motor exam: Right upper extremity is antigravity, right lower extremity  2/5.  Left upper extremity 0/5, left lower extremity 0/5.  Increased tone in the left upper and lower extremity. Difficult to assess for dysmetria Sensation intact all over NIH stroke scale 1a Level of Conscious.: 0 1b LOC Questions: 2 1c LOC Commands: 0 2 Best Gaze: 0 3 Visual: 2 4 Facial Palsy: 1 5a Motor Arm - left: 3 5b Motor Arm - Right: 0 6a Motor Leg - Left: 2 6b Motor Leg - Right: 2 7 Limb Ataxia: 0 8 Sensory: 0 9 Best Language: 0 10 Dysarthria: 2 11 Extinct. and Inatten.: 0 TOTAL: 14   Labs I have reviewed labs in epic and the results pertinent to this consultation are:  CBC    Component Value Date/Time   WBC 9.7 11/21/2021 2356   RBC 3.85 (L) 11/21/2021 2356   HGB 9.8 (L) 11/21/2021 2356   HCT 32.4 (L) 11/21/2021 2356   PLT 445 (H) 11/21/2021 2356   MCV 84.2 11/21/2021 2356   MCH 25.5 (L) 11/21/2021 2356   MCHC 30.2 11/21/2021 2356   RDW 24.5 (H) 11/21/2021 2356   LYMPHSABS PENDING 11/21/2021 2356   MONOABS PENDING 11/21/2021 2356   EOSABS PENDING 11/21/2021 2356   BASOSABS  PENDING 11/21/2021 2356    CMP     Component Value Date/Time   NA 143 11/21/2021 2356   NA 138 03/01/2021 1417   K 3.1 (L) 11/21/2021 2356   CL 103 11/21/2021 2356   CO2 28 11/21/2021 2356   GLUCOSE 93 11/21/2021 2356   BUN 60 (H) 11/21/2021 2356   BUN 26 (H) 03/01/2021 1417   CREATININE 1.33 (H) 11/21/2021 2356   CALCIUM 11.3 (H) 11/21/2021 2356   PROT 8.7 (H) 11/21/2021 2356   ALBUMIN 3.4 (L) 11/21/2021 2356   AST 20 11/21/2021 2356   ALT 16 11/21/2021 2356   ALKPHOS 215 (H) 11/21/2021 2356   BILITOT 0.9 11/21/2021 2356   GFRNONAA 50 (L) 11/21/2021 2356   GFRAA >60 05/03/2020 0350    Imaging I have reviewed the images obtained:  CT-head-no acute changes.  Large chronic right MCA territory infarct.  Chronic left cerebellar infarct.  Multiple lacunar infarcts.  Assessment:  45 year old with past history as above presenting from the facility for worsening speech and drooling from the right side of the face. On my examination, she has her baseline left hemiparesis but his right leg weakness is worse as well as speech is extremely dysarthric. I was not able to appreciate the right facial weakness as much but given her history with strokes at young age, evaluation for a new stroke is reasonable. That said, she was less responsive when she was evaluated by the ED providers and became more responsive by the time I went to evaluate her-although no reported seizures, she should have an EEG as well as seizures followed by postictal lethargy/Todd's paralysis should also be considered. Other differentials include recrudescence of old stroke symptoms in the setting of acute metabolic derangement such as AKI.  Impression: Evaluate for new stroke versus recrudescence of old stroke symptoms in the setting of metabolic derangements versus seizures  Recommendations: Admit to hospitalist Urinalysis Routine EEG MRI of the brain without contrast Management of AKI per primary team Full  stroke work-up if MRI shows a new stroke. Further recommendations after the above tests Plan discussed with Dr. Leonette Monarch  -- Amie Portland, MD Neurologist [facility] Pager: [phone]

## 2021-11-22 NOTE — Evaluation (Signed)
Clinical/Bedside Swallow Evaluation Patient Details  Name: Holly Bradley MRN: [id number] Date of Birth: [birth year]  Today's Date: 11/22/2021 Time: SLP Start Time (ACUTE ONLY): 1015 SLP Stop Time (ACUTE ONLY): 1030 SLP Time Calculation (min) (ACUTE ONLY): 15 min  Past Medical History:  Past Medical History:  Diagnosis Date   CHF (congestive heart failure) (HCC)    Chronic blood loss anemia    Related to uterine fibroids   Essential hypertension    Poorly controlled, repeat by report only on nicardipine 90 mg   Fibroid    Likely complicated by by menometrorrhagia and chronic anemia   Hypertension    Paroxysmal SVT (supraventricular tachycardia) (HCC)    Frequent episodes, can last anywhere from 20 minutes to 12-15 hours.  Not on beta-blocker or calcium channel blocker   Uterine fibroid    Past Surgical History:  Past Surgical History:  Procedure Laterality Date   IR ANGIOGRAM FOLLOW UP STUDY  05/15/2021   IR AORTAGRAM ABDOMINAL SERIALOGRAM  10/16/2021   IR CT HEAD LTD  05/15/2021   IR EMBO ARTERIAL NOT HEMORR HEMANG INC GUIDE ROADMAPPING  10/16/2021   IR PERCUTANEOUS ART THROMBECTOMY/INFUSION INTRACRANIAL INC DIAG ANGIO  05/15/2021       IR PERCUTANEOUS ART THROMBECTOMY/INFUSION INTRACRANIAL INC DIAG ANGIO  05/15/2021   IR TRANSCATH/EMBOLIZ  05/15/2021   IR US GUIDE VASC ACCESS RIGHT  10/16/2021   IVC FILTER INSERTION N/A 10/24/2021   Procedure: IVC FILTER INSERTION;  Surgeon: Serafina Mitchell, MD;  Location: [facility];  Service: Cardiovascular;  Laterality: N/A;   RADIOLOGY WITH ANESTHESIA N/A 05/15/2021   Procedure: IR WITH ANESTHESIA;  Surgeon: Radiologist, Medication, MD;  Location: [facility];  Service: Radiology;  Laterality: N/A;   Unknown     HPI:  45 year old female presented to the ED with acute onset of aphasia and right sided weakness. stsroke w/u underway. COVID + but asymptomatic. Hx of right MCA CVA 04/2021 with thrombectomy; multiple intubations; trach and  decannulation; D/Cd to [facility] 7/22..  Several MBS studies - the last was 06/21/21, which revealed mild dysphagia with oral residue, occasional trace aspiration of thin liquids with strong cough response. Dys3/thin liquids was recommended at D/C.  Her diet at [facility] has been regular with nectar thick liquids per chart review and per pt's discretion. Hx of DVT, HTN, HF with recovered EF, paroxsymal SVT, uterine fibroids.    Assessment / Plan / Recommendation  Clinical Impression  Pt presents with a likely baseline dysphagia marked by issues with oral containment and mild residue post swallow, coughing after drinking thin liquids, no difficulty with nectars.  Pt with left CN VII asymmetry c/w baseline. SHe feeds herself quickly/impulsively. She stated that she was on pureed foods at the SNF and would prefer to stay on that diet for now; recommend resuming dysphagia 1/nectar thick liquids. She may benefit from repeat MBS while here to determine potential to advance liquids.  D/W RN. SLP will follow. SLP Visit Diagnosis: Dysphagia, oropharyngeal phase (R13.12)    Aspiration Risk  Mild aspiration risk    Diet Recommendation   Dysphagia 1, nectar thick liquids  Medication Administration: Whole meds with puree    Other  Recommendations Oral Care Recommendations: Oral care BID Other Recommendations: Order thickener from pharmacy    Recommendations for follow up therapy are one component of a multi-disciplinary discharge planning process, led by the attending physician.  Recommendations may be updated based on patient status, additional functional criteria and insurance authorization.  Follow up Recommendations Skilled nursing-short term rehab (<3 hours/day)      Assistance Recommended at Discharge Frequent or constant Supervision/Assistance  Functional Status Assessment    Frequency and Duration min 2x/week  1 week       Prognosis Prognosis for Safe Diet Advancement: Good Barriers to Reach  Goals: Cognitive deficits      Swallow Study   General HPI: 45 year old female presented to the ED with acute onset of aphasia and right sided weakness. stsroke w/u underway. COVID + but asymptomatic. Hx of right MCA CVA 04/2021 with thrombectomy; multiple intubations; trach and decannulation; D/Cd to [facility] 7/22..  Several MBS studies - the last was 06/21/21, which revealed mild dysphagia with oral residue, occasional trace aspiration of thin liquids with strong cough response. Dys3/thin liquids was recommended at D/C.  Her diet at [facility] has been regular with nectar thick liquids per chart review and per pt's discretion. Hx of DVT, HTN, HF with recovered EF, paroxsymal SVT, uterine fibroids. Type of Study: Bedside Swallow Evaluation Previous Swallow Assessment: see HPI Diet Prior to this Study: NPO Temperature Spikes Noted: No Respiratory Status: Room air History of Recent Intubation: No Behavior/Cognition: Alert;Cooperative Oral Cavity Assessment: Dried secretions Oral Care Completed by SLP: Yes Oral Cavity - Dentition: Adequate natural dentition Vision: Functional for self-feeding Self-Feeding Abilities: Able to feed self Patient Positioning: Upright in bed Baseline Vocal Quality: Normal Volitional Cough: Strong Volitional Swallow: Able to elicit    Oral/Motor/Sensory Function Overall Oral Motor/Sensory Function: Mild impairment Facial ROM: Reduced left;Suspected CN VII (facial) dysfunction Facial Symmetry: Abnormal symmetry left;Suspected CN VII (facial) dysfunction   Ice Chips Ice chips: Within functional limits   Thin Liquid Thin Liquid: Impaired Pharyngeal  Phase Impairments: Cough - Immediate    Nectar Thick Nectar Thick Liquid: Within functional limits   Honey Thick Honey Thick Liquid: Not tested   Puree Puree: Impaired Presentation: Self Fed;Spoon Oral Phase Functional Implications: Oral residue   Solid     Solid: Impaired Oral Phase Functional Implications: Oral  residue      [name] 11/22/2021,11:21 AM  Estill Bamberg L. [name], [place] Office number [phone] Pager [phone]

## 2021-11-22 NOTE — ED Notes (Signed)
Spoke to pt brother and provided update with pt care. Phone provided to pt and is speaking with brother.

## 2021-11-22 NOTE — Progress Notes (Signed)
HD#0 SUBJECTIVE:  Patient Summary: Patient is a 46 year old female with CVA in 2022 with resulting hemiparesis, Hx of DVT, HTN, HF with recovered EF, paroxsymal SVT, uterine fibroids who presented to the ED with acute onset of aphasia and right sided weakness.  Overnight Events: NAEON  Interim History: Patient was seen and assessed at bedside this morning. On interview the patient has limited ability to produce speech, but is able to accurately relate her name as well as month and year. She endorses ABD pain and says that she has not had a BM recently. She denies SoB and CP. Patient is agreeable to MRI with light sedation today.   OBJECTIVE:  Vital Signs: Vitals:   11/22/21 1130 11/22/21 1200 11/22/21 1300 11/22/21 1315  BP: (!) 169/102 (!) 158/98 (!) 162/103 (!) 173/104  Pulse: 81 77 80 74  Resp: 11 18 15 10   Temp:      TempSrc:      SpO2: 98% 98% 99% 98%  Weight:      Height:          Intake/Output Summary (Last 24 hours) at 11/22/2021 1341 Last data filed at 11/22/2021 1150 Gross per 24 hour  Intake 3100 ml  Output --  Net 3100 ml   Net IO Since Admission: 3,100 mL [11/22/21 1341]  Physical Exam: Physical Exam Vitals reviewed.  Cardiovascular:     Rate and Rhythm: Normal rate and regular rhythm.     Pulses: Normal pulses.     Heart sounds: No murmur heard. Pulmonary:     Effort: Pulmonary effort is normal.     Breath sounds: Normal breath sounds.  Abdominal:     General: Bowel sounds are normal.     Palpations: Abdomen is soft.     Tenderness: There is no abdominal tenderness.  Neurological:     Mental Status: She is alert and oriented to person, place, and time.     Comments: 2/5 strength in RUE, moderate R hand grip strength, Unable to move left side at all    Patient Lines/Drains/Airways Status     Active Line/Drains/Airways     Name Placement date Placement time Site Days   Peripheral IV 11/22/21 22 G Right Hand 11/22/21  0015  Hand  less than 1    Peripheral IV 11/22/21 22 G Distal;Posterior;Right Forearm 11/22/21  0828  Forearm  less than 1   External Urinary Catheter 10/26/21  1426  --  27   Pressure Injury 10/14/21 Buttocks Left Stage 2 -  Partial thickness loss of dermis presenting as a shallow open injury with a red, pink wound bed without slough. 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  Plan: #Aphasia/right sided weakness New neuro deficits in this patient with Hx of R MCA stroke raises concern for new stroke, recrudescence of old stroke, or seizure. CTH was negative for acute anomaly. MRI has yet to be obtained and will guide further workup, depending on whether or not the patient has experienced a new stroke. 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 Echo from 08/7415 showed no embolic source, no IA shunt. EEG done today negative for epileptiform changes, notable for generalized slowing in R hemisphere, possibly related to previous stroke. -Neuro Consult, appreciate recs -MRI today -Further stroke workup pending MRI results -UA  -Continue atorvastatin 40 mg daily, ASA 150 mg rectally daily -Dysphagia 1 diet with nectar thick liquids, per SLP  #Hx LLE DVT Patient with HX LLE DVT, was on Eliquis until hospitalization recently for uterine bleeding. She had an IVC filter placed and was taken off Eliquis. Consider that her provoking factor for DVT will not be removed (ie immobility) anticoagulation going froward would be ideal.  -Consider vascular consult for potential IVC removal and resuming Eliquis   #Elevated  troponin Trops from 90 to 101. Low concern for ACS given lack of CP and re-assuring EKG. -F/u repeat trops  #Abdominal pain #Likely constipation KUB showing stool burden. -Miralax BID, can escalate as needed  #Hypokalemia K of 3.1 this AM, mg of 2.0. Replaced with IV KCl 10 meq x4 so far. Given Hx of paroxysmal SVT, will keep K>4.  -K replacement as needed  #Covid positive Patient is asymptomatic: no dyspnea, satting well on RA, afebrile.  -Monitor respiratory status -Contact precautions  #AKI (resolved) Patient with Cr to 1.3 on a normal baseline. Cr has returned to 1.15 this morning. -daily BMP  #HTN Home regimen of Coreg 25 mg BID, Clonidine 0.1 mg TID, Hydral 100 mg TID. Held for permissive hypertension. SBP from 150-190 so far.  #Paroxysmal SVT -Continue home amio 200 mg daily.   FEN/GI: dysphagia 1 diet, with nectar thick liquids VTE: Lovenox Dispo: return to SNF Code Status: FULL  Signature: Corky Sox, MD PGY-1 Pager: [phone]  Please contact the on call pager after 5 pm and on weekends at [phone].

## 2021-11-22 NOTE — ED Notes (Signed)
The pt is on the  call bell almost cionstantly

## 2021-11-22 NOTE — H&P (Addendum)
Date: 11/22/2021               Patient Name:  Holly Bradley MRN: [id number]  DOB: 1976-03-13 Age / Sex: 45 y.o., female   PCP: Mitzi Hansen, MD         Medical Service: Internal Medicine Teaching Service         Attending Physician: Dr. Lucious Groves, DO    First Contact: Dr. Humphrey Rolls Pager: [phone]  Second Contact: Dr. Lisabeth Devoid Pager: [phone]       After Hours (After 5p/  First Contact Pager: [phone]  weekends / holidays): Second Contact Pager: [phone]   Chief Complaint: aphasia/new weakness  History of Present Illness:  Patient is a 45 year old female with CVA in 2022, Hx of DVT, HTN, HF with recovered EF, paroxsymal SVT, uterine fibroids who presented to the ED with acute onset of aphasia and right sided weakness. Her PCP is Dr. Darrick Meigs with [facility] and was last seen in 02/2021. She was not able to answer questions except with head nodding so chart was reviewed to obtain history.   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 She was discharged on 06/2021 to a SNF. She presented to hospital again on 09/2021 for uterine bleeding with hgb of 2.9. It appears she was started on Eliquis as their was concern for lupus anticoagulant but this was held on discharge in 10/2021 where an IVC filter was placed.   She presented on 11/21/21 with new aphasia and right sided weakness. Her last normal was on 11/20/21 at 11pm. Her baseline functional status is limits her to wheel chair or bedbound but she is able to feed herself. She had dysarthria prior to this event as noted in Dr. Clydene Fake office visit.   On ROS she was able to nod yes and no. She nodded yes to pain in her lower extremities, pain in her chest, abdomen and a headache.    Past Medical History:  Diagnosis Date   CHF (congestive heart failure) (HCC)    Chronic blood loss anemia    Related to uterine fibroids    Essential hypertension    Poorly controlled, repeat by report only on nicardipine 90 mg   Fibroid    Likely complicated by by menometrorrhagia and chronic anemia   Hypertension    Paroxysmal SVT (supraventricular tachycardia) (HCC)    Frequent episodes, can last anywhere from 20 minutes to 12-15 hours.  Not on beta-blocker or calcium channel blocker   Uterine fibroid     Meds:  Current Meds  Medication Sig   acetaminophen (TYLENOL) 500 MG tablet Take 2 tablets (1,000 mg total) by mouth every 6 (six) hours.   Amino Acids-Protein Hydrolys (FEEDING SUPPLEMENT, PRO-STAT SUGAR FREE 64,) LIQD Take 30 mLs by mouth in the morning and at bedtime.   amiodarone (PACERONE) 200 MG tablet Take 200 mg by mouth daily.   aspirin 81 MG chewable tablet Chew 1 tablet (81 mg total) by mouth daily.   atorvastatin (LIPITOR) 40 MG tablet Take 1 tablet (40 mg total) by mouth daily. (Patient taking differently: Take 40 mg by mouth at bedtime.)   baclofen (LIORESAL) 20 MG tablet Take 1 tablet (20 mg total) by mouth 3 (three) times daily.   bisacodyl (DULCOLAX) 10 MG suppository Place 10 mg rectally See admin instructions. As needed for constipation x 1 dose if no relief from milk of mag  bisacodyl (DULCOLAX) 5 MG EC tablet Take 2 tablets (10 mg total) by mouth daily.   carvedilol (COREG) 25 MG tablet Take 1 tablet (25 mg total) by mouth 2 (two) times daily with a meal.   cloNIDine (CATAPRES) 0.1 MG tablet Take 1 tablet (0.1 mg total) by mouth 3 (three) times daily.   escitalopram (LEXAPRO) 20 MG tablet Take 20 mg by mouth daily.   feeding supplement (ENSURE ENLIVE / ENSURE PLUS) LIQD Take 237 mLs by mouth 3 (three) times daily between meals.   ferrous sulfate 325 (65 FE) MG tablet Take 1 tablet (325 mg total) by mouth 2 (two) times daily with a meal.   hydrALAZINE (APRESOLINE) 100 MG tablet Take 1 tablet (100 mg total) by mouth 3 (three) times daily.   isosorbide mononitrate (IMDUR) 60 MG 24 hr tablet Take 60 mg by  mouth daily.   lactulose (CHRONULAC) 10 GM/15ML solution Take 15 mLs (10 g total) by mouth 2 (two) times daily.   linaclotide (LINZESS) 145 MCG CAPS capsule Take 145 mcg by mouth daily before breakfast.   magnesium hydroxide (MILK OF MAGNESIA) 400 MG/5ML suspension Take 30 mLs by mouth See admin instructions. As needed for constipation x 1 dose   mirtazapine (REMERON) 7.5 MG tablet Take 7.5 mg by mouth at bedtime.   Multiple Vitamin (MULTIVITAMIN WITH MINERALS) TABS tablet Take 1 tablet by mouth daily.   pantoprazole (PROTONIX) 40 MG tablet Take 40 mg by mouth daily.   polyethylene glycol (MIRALAX / GLYCOLAX) 17 g packet Take 17 g by mouth daily.   sacubitril-valsartan (ENTRESTO) 97-103 MG Take 1 tablet by mouth 2 (two) times daily.   senna-docusate (SENOKOT-S) 8.6-50 MG tablet Take 2 tablets by mouth 2 (two) times daily.   simethicone (MYLICON) 40 FG/1.8EX drops Take 0.6 mLs (40 mg total) by mouth every 6 (six) hours as needed for flatulence.   Sodium Phosphates (RA SALINE ENEMA RE) Place 1 enema rectally See admin instructions. As needed for constipation if no relief from bisacodyl suppository   torsemide (DEMADEX) 20 MG tablet TAKE 2 TABLETS (40 MG TOTAL) BY MOUTH 2 (TWO) TIMES DAILY. (Patient taking differently: Take 40 mg by mouth 2 (two) times daily.)     Allergies: Allergies as of 11/21/2021   (No Known Allergies)    Family History:  Family History  Problem Relation Age of Onset   Dementia Mother     Social History:  Social History   Socioeconomic History   Marital status: Single    Spouse name: Not on file   Number of children: 0   Years of education: Not on file   Highest education level: Not on file  Occupational History   Not on file  Tobacco Use   Smoking status: Every Day   Smokeless tobacco: Never  Vaping Use   Vaping Use: Never used  Substance and Sexual Activity   Alcohol use: Not Currently    Comment: Occasionally   Drug use: Not Currently    Types:  Marijuana   Sexual activity: Not on file  Other Topics Concern   Not on file  Social History Narrative   ** Merged History Encounter **       Social Determinants of Health   Financial Resource Strain: High Risk   Difficulty of Paying Living Expenses: Very hard  Food Insecurity: No Food Insecurity   Worried About Running Out of Food in the Last Year: Never true   [place] in the Last Year:  Never true  Transportation Needs: No Transportation Needs   Lack of Transportation (Medical): No   Lack of Transportation (Non-Medical): No  Physical Activity: Not on file  Stress: Not on file  Social Connections: Not on file  Intimate Partner Violence: Not on file     Physical Exam: Blood pressure (!) 166/94, pulse 75, temperature 98.7 F (37.1 C), temperature source Axillary, resp. rate 19, height 5\' 5"  (1.651 m), weight 63.9 kg, SpO2 100 %. General: ill appearing, NAD Head: Normocephalic without scalp lesions.  Eyes: Conjunctivae pink, sclerae white, no icterus Mouth and Throat: Dry mouth with crusting on lips and tongue Neck: Neck supple. Lungs: CTAB, no wheeze, rhonchi or rales.  Cardiovascular: Normal heart sounds, no r/m/g, 2+ pulses in all extremities Abdomen: Diffusely TTP, abdomen firm on palpation. Bowel sounds present.  MSK: Strength 5/5 in right upper extremity with weak grip. RLE is 1/5 with increase in strength distally if supported at knee. Feet contracture present.  Skin: warm, lesion covered in guaze pad on left foot.  Neuro: Alert and oriented. EOM intact. Smile asymmetric. Facial sensation appears intact. Shoulder shrug only on right side.  Psych: Normal mood and normal affect  CBC Latest Ref Rng & Units 11/21/2021 10/27/2021 10/26/2021  WBC 4.0 - 10.5 K/uL 9.7 12.6(H) 11.9(H)  Hemoglobin 12.0 - 15.0 g/dL 9.8(L) 9.2(L) 8.9(L)  Hematocrit 36.0 - 46.0 % 32.4(L) 30.1(L) 27.2(L)  Platelets 150 - 400 K/uL 445(H) 507(H) 424(H)    CMP Latest Ref Rng & Units 11/21/2021  10/27/2021 10/26/2021  Glucose 70 - 99 mg/dL 93 85 -  BUN 6 - 20 mg/dL 60(H) 9 -  Creatinine 0.44 - 1.00 mg/dL 1.33(H) 0.58 -  Sodium 135 - 145 mmol/L 143 137 -  Potassium 3.5 - 5.1 mmol/L 3.1(L) 4.4 4.5  Chloride 98 - 111 mmol/L 103 110 -  CO2 22 - 32 mmol/L 28 21(L) -  Calcium 8.9 - 10.3 mg/dL 11.3(H) 9.8 -  Total Protein 6.5 - 8.1 g/dL 8.7(H) - -  Total Bilirubin 0.3 - 1.2 mg/dL 0.9 - -  Alkaline Phos 38 - 126 U/L 215(H) - -  AST 15 - 41 U/L 20 - -  ALT 0 - 44 U/L 16 - -    CT Head Wo Contrast  Result Date: 11/22/2021 CLINICAL DATA:  Speech deficit, stroke suspected. EXAM: CT HEAD WITHOUT CONTRAST TECHNIQUE:  IMPRESSION: 1. No acute intracranial CT findings, but advanced small vessel disease and old right MCA infarct limit assessment for isolated white matter ischemia. Follow-up with MRI if there is concern for occult infarct. 2. Additional chronic infarct in the superior left cerebellum, lacunar infarcts left gangliocapsular area and brainstem as seen previously. 3. Worsening sinus disease greatest in the ethmoid air cells, without fluid levels but with multifocal ethmoid sinus opacification. Electronically Signed   By: Telford Nab M.D.   On: 11/22/2021 00:09     EKG: personally reviewed my interpretation is NSR.  Assessment & Plan by Problem: Patient is a 45 year old female with past medical hx of CVA in 2022, Hx of DVT, HTN, HF with recovered EF, paroxsymal SVT, uterine fibroids who presented to the ED with acute onset of aphasia and right sided weakness with CTH wo contrast negative for any acute findings.   Aphasia/Right Sided Weakness CVA vs post stroke recrudescence vs todd's paralysis  Hx of CVA Patient's presented with new onset aphasia and new weakness. This could be due to another stroke vs symptom reappearance from old stroke vs  todd's paralysis. Although no new infarcts were seen on [facility], detailed imaging is yet pending. This may be most likely given territory of the  previous stroke which was R MCA. She did develop complications from the previous stroke and had right sided weakness during that admission which may be recurring. 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  -Neurology was consulted by ED and they are following the patient. Appreciate their assistance taking care of this patient.  -Follow recommendations made by neurology.  -Urinalysis -Routine EEG -MRI of the brain without contrast  -Stroke workup if new stroke is seen.   Acute Kidney Injury Patient has normal kidney function at baseline but currently has an AKI. Given her clinical presentation, the AKI appears to be pre-renal. This is further supported by a BUN:Cr ratio of 45. Patient is s/p 2 liters since presentation. She had hypercalcemia along with increase in ALP. Will continue to monitor this as patient appears significantly dehydrated.  -Monitor Cr -Hydrate as necessary  Hypokalemia Patient was hypokalemic at 3.1.  -Ordered potassium repletion, checking magnesium -Continue to monitor -Will replete as needed  Abdominal Pain Patient complains of abdominal pain and abdomen is firm on palpation. Suspect this may be due to constipation in setting of decreased fluid intake. This is complicated by the fact patient unable to provide further hx. Will order a KUB and if stool burden not found then will work up further. -KUB -IV hydration -Will add laxative if stool burden present.   COVID Positive Patient was found to be positive for COVID but does not have any symptoms. She is afebrile and satting well on RA.  -Continue to monitor symptoms.   Chronic Problems Hypertension Patient has hx of htn. Home meds include coreg 25 mg BID, Clonidine 0.1 TID, hydralazine 100 mg TID. -Will hold home due to permissive htn  CHF Patient has CHF with EF in 12/2020 being 20% and in 04/2021 being 55%. Her home  meds include Entresto 97-103mg , IMDUR 60 mg qd, Torsemide 40 mg BID. -Hold CHF meds in setting of stroke due to their bp lower effect.   Paroxsymal SVT Patient has hx of paroxsymal SVT and is on Amiodarone 200 mg qd. She was in NSR on physical exam. -Continue Amiodarone 200 mg qd  HLD Patient has hx of hld. On lipitor 40 mg daily. Last lipid panel shown below: Lipid Panel     Component Value Date/Time   CHOL 131 05/16/2021 0457   TRIG 81 05/24/2021 0447   HDL 19 (L) 05/16/2021 0457   CHOLHDL 6.9 05/16/2021 0457   VLDL 36 05/16/2021 0457   LDLCALC 76 05/16/2021 0457  -Continue lipitor 40 mg daily   DVT prophx:SCDs  Diet: NPO Bowel: PRN Code:Full  Prior to Admission Living Arrangement: SNF Anticipated Discharge Location:TBD Barriers to Discharge:Medical Workup  Dispo: Admit patient to Observation with expected length of stay less than 2 midnights.  Idamae Schuller, MD [facility] R[facility]. [facility] Internal Medicine Residency, PGY-1  Pager: [phone]

## 2021-11-22 NOTE — Procedures (Addendum)
[name]  MRN: [id number]  Epilepsy Attending: Lora Havens  Referring Physician/Provider: Dr Idamae Schuller Date: 11/22/2021 Duration: 25.04 mins  Patient history: 45 year old female with new onset aphasia and weakness.  EEG to evaluate for seizure.  Level of alertness: Awake  AEDs during EEG study: None  Technical aspects: This EEG study was done with scalp electrodes positioned according to the 10-20 International system of electrode placement. Electrical activity was acquired at a sampling rate of 500Hz  and reviewed with a high frequency filter of 70Hz  and a low frequency filter of 1Hz . EEG data were recorded continuously and digitally stored.   Description: The posterior dominant rhythm consists of 9 Hz activity of moderate voltage (25-35 uV) seen predominantly in posterior head regions, symmetric and reactive to eye opening and eye closing.  EEG showed intermittent generalized and lateralized right hemisphere 3 to 6 Hz theta-delta slowing. Hyperventilation and photic stimulation were not performed.     ABNORMALITY - Intermittent slow, generalized and lateralized right hemisphere  IMPRESSION: This study is suggestive of cortical dysfunction in right hemisphere, nonspecific etiology but could be secondary to underlying stroke.  Additionally there is mild diffuse encephalopathy, nonspecific etiology. No seizures or definite epileptiform discharges were seen throughout the recording.  [name] [name]

## 2021-11-22 NOTE — Evaluation (Signed)
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)    Acute blood loss anemia 10/10/2021   Hypertension    Non-ischemic cardiomyopathy (HCC)    Swelling of left hand    Proctocolitis    Constipation    Uterine fibroid    Left hemiparesis (HCC)    Hemorrhagic stroke (HCC)    Abnormal uterine bleeding (AUB)    Staphylococcus epidermidis bacteremia    Iron deficiency anemia due to chronic blood loss 05/25/2021   Cerebral edema (HCC) 05/25/2021   Chronic HFrEF (heart failure with reduced ejection fraction) ([place]) 05/25/2021   Pneumonia due to methicillin susceptible Staphylococcus aureus ([place]) 05/25/2021   Acute respiratory failure with hypoxia (HCC)    Acute right MCA stroke ([place]) 05/15/2021   Uncontrolled hypertension 05/15/2021   Cerebrovascular accident (CVA) ([place]) 05/15/2021   IDA (iron deficiency anemia) 03/01/2021   Healthcare maintenance 03/01/2021   Onychomycosis 03/01/2021   Food insecurity 02/02/2021   Psychosis ([place])    Fibroid 01/11/2021   Menorrhagia 01/11/2021   Acute exacerbation of CHF (congestive heart failure) ([place]) 01/10/2021   AKI (acute kidney injury) ([place])    Symptomatic anemia    SVT (supraventricular tachycardia) ([place]) 05/01/2020   CHF (congestive heart failure) ([place]) 05/01/2020   Past Medical History:  Past Medical History:  Diagnosis Date   CHF (congestive heart failure) (HCC)    Chronic blood loss anemia    Related to uterine fibroids   Essential hypertension    Poorly controlled, repeat by report only on nicardipine 90 mg   Fibroid    Likely complicated by by  menometrorrhagia and chronic anemia   Hypertension    Paroxysmal SVT (supraventricular tachycardia) (HCC)    Frequent episodes, can last anywhere from 20 minutes to 12-15 hours.  Not on beta-blocker or calcium channel blocker   Uterine fibroid    Past Surgical History:  Past Surgical History:  Procedure Laterality Date   IR ANGIOGRAM FOLLOW UP STUDY  05/15/2021   IR AORTAGRAM ABDOMINAL SERIALOGRAM  10/16/2021   IR CT HEAD LTD  05/15/2021   IR EMBO ARTERIAL NOT HEMORR HEMANG INC GUIDE ROADMAPPING  10/16/2021   IR PERCUTANEOUS ART THROMBECTOMY/INFUSION INTRACRANIAL INC DIAG ANGIO  05/15/2021       IR PERCUTANEOUS ART THROMBECTOMY/INFUSION INTRACRANIAL INC DIAG ANGIO  05/15/2021   IR TRANSCATH/EMBOLIZ  05/15/2021   IR US GUIDE VASC ACCESS RIGHT  10/16/2021   IVC FILTER INSERTION N/A 10/24/2021   Procedure: IVC FILTER INSERTION;  Surgeon: Serafina Mitchell, MD;  Location: [place] [facility];  Service: Cardiovascular;  Laterality: N/A;   RADIOLOGY WITH ANESTHESIA N/A 05/15/2021   Procedure: IR WITH ANESTHESIA;  Surgeon: Radiologist, Medication, MD;  Location: [facility];  Service: Radiology;  Laterality: N/A;   Unknown     HPI:  45 year old female presented to the ED with acute onset of aphasia and right sided weakness. stsroke w/u underway. COVID + but asymptomatic. Hx of right MCA CVA 04/2021 with thrombectomy; multiple intubations; trach and decannulation; D/Cd to [facility] 7/22..  Several MBS studies - the last was 06/21/21, which revealed mild dysphagia with oral residue, occasional trace  aspiration of thin liquids with strong cough response. Dys3/thin liquids was recommended at D/C.  Her diet at [facility] has been regular with nectar thick liquids per chart review and per pt's discretion. Hx of DVT, HTN, HF with recovered EF, paroxsymal SVT, uterine fibroids.   Assessment / Plan / Recommendation Clinical Impression  Pt presents with baseline deficits in speech (mild dysarthria) with flat affect,  monotone speech patterns, hypernasal resonance.  Cognition is marked by impulsivity, difficulty with self-regulation and impaired judgment/problemsolving.  She is fully oriented and communicates needs appropriately. There are no s/s of aphasia.  SLP currently following for swallowing; will f/u for speech/cognition while hospitalized.    SLP Assessment  SLP Recommendation/Assessment: Patient needs continued Speech False Pass Pathology Services SLP Visit Diagnosis: Cognitive communication deficit (R41.841)    Recommendations for follow up therapy are one component of a multi-disciplinary discharge planning process, led by the attending physician.  Recommendations may be updated based on patient status, additional functional criteria and insurance authorization.    Follow Up Recommendations  Skilled nursing-short term rehab (<3 hours/day)    Assistance Recommended at Discharge  Frequent or constant Supervision/Assistance  Functional Status Assessment    Frequency and Duration min 2x/week  1 week      SLP Evaluation Cognition  Overall Cognitive Status: History of cognitive impairments - at baseline Arousal/Alertness: Awake/alert Orientation Level: Oriented X4 Attention: Sustained Sustained Attention: Appears intact Awareness: Impaired Awareness Impairment: Emergent impairment Problem Solving: Impaired Behaviors: Impulsive Safety/Judgment: Impaired       Comprehension  Auditory Comprehension Overall Auditory Comprehension: Appears within functional limits for tasks assessed Yes/No Questions: Within Functional Limits Commands: Within Functional Limits Visual Recognition/Discrimination Discrimination: Within Function Limits Reading Comprehension Reading Status: Not tested    Expression Expression Primary Mode of Expression: Verbal Verbal Expression Overall Verbal Expression: Appears within functional limits for tasks assessed   Oral / Motor  Oral Motor/Sensory Function Overall  Oral Motor/Sensory Function: Mild impairment Facial ROM: Reduced left;Suspected CN VII (facial) dysfunction Facial Symmetry: Abnormal symmetry left;Suspected CN VII (facial) dysfunction Motor Speech Overall Motor Speech: Impaired Phonation: Normal Resonance: Hypernasality Articulation: Impaired Level of Impairment: Sentence Intelligibility: Intelligibility reduced   GO                    [name] 11/22/2021, 11:28 AM

## 2021-11-22 NOTE — ED Notes (Signed)
The pt  is very hard to understand she keeps moaning

## 2021-11-22 NOTE — ED Notes (Signed)
Pt back in room at this time. Pt called out using call bell x 3 within 1 minute requesting staff to room despite staff telling pt they would be in the room in a moment.

## 2021-11-22 NOTE — ED Provider Notes (Signed)
Attestation: Medical screening examination/treatment/procedure(s) were conducted as a shared visit with non-physician practitioner(s) and myself.  I personally evaluated the patient during the encounter.   Briefly, the patient is a 45 y.o. female with h/o right MCA ischemic stroke complicated by [place] with left sided deficits, here for new aphasia, facial droop. Last seen at baseline 11pm last night (now 24hrs ago). Had infectious work up at [place] that was reportedly negative.    Vitals:   11/21/21 2223  BP: (!) 158/108  Pulse: 81  Resp: 19  Temp: 98.7 F (37.1 C)  SpO2: 96%    CONSTITUTIONAL:  nontoxic-appearing, NAD NEURO:  Alert, nods to answer questions. Drooling on right with possible right facial droop. Patient able to lift her right arm on command and squeeze her right hand with 2/5 strength, able to smile and raise both eyebrows equally.  Able to wiggle toes on the right side, unable to lift her right leg.  Left side flaccid per baseline EYES:  pupils equal and reactive ENT/NECK:  trachea midline, no JVD CARDIO:  reg rate, reg rhythm, well-perfused PULM:  non-labored breathing GI/GU:  Abdomen non-distended MSK/SPINE:  No gross deformities, no edema SKIN:  warm and dry PSYCH:  Appropriate speech and behavior   EKG Interpretation  Date/Time:  Thursday November 22 2021 01:43:52 EST Ventricular Rate:  66 PR Interval:  189 QRS Duration: 102 QT Interval:  454 QTC Calculation: 476 R Axis:   106 Text Interpretation: Sinus rhythm Consider left atrial enlargement Anterior infarct, old No acute changes Confirmed by Addison Lank [phone]) on 11/22/2021 1:45:48 AM        CT head w/o ICH or obvious new infarct. Already 24 hrs from [facility]. Not a candidate for endovascular intervention.  Rest of work up: No leukocytosis and Hb stable. New mild AKI and noted to have hypercalcemia at 11.3 - Will get bladder scan and UA. IVF given.  COVID + result. On questioning the patient, she nodded no  to having COVID this week - nodded yes to having it this month, prior to Thanksgiving.  Currently denies any pain after low dose fentanyl.   Neurology consulted who recommended MRI and EEG. Admitted to IM for continued work up and management.      Fatima Blank, MD 11/22/21 [phone]

## 2021-11-22 NOTE — ED Notes (Signed)
[name] brother [phone] requesting an update on the patient

## 2021-11-22 NOTE — Progress Notes (Addendum)
Patient received from ED, BP 195/100 patient didn't get her morning medication despite  patient had been cleared by SLP this morning at 1045. Dr.Jinwala notified and advised to not give the medication and notify the on call for BP if symptomatic. First and initial NIHSS done when patient arrived to the floor, Will continue to monitor.

## 2021-11-23 DIAGNOSIS — R29810 Facial weakness: Secondary | ICD-10-CM | POA: Diagnosis present

## 2021-11-23 DIAGNOSIS — Z79899 Other long term (current) drug therapy: Secondary | ICD-10-CM | POA: Diagnosis not present

## 2021-11-23 DIAGNOSIS — U071 COVID-19: Secondary | ICD-10-CM | POA: Diagnosis present

## 2021-11-23 DIAGNOSIS — D259 Leiomyoma of uterus, unspecified: Secondary | ICD-10-CM | POA: Diagnosis present

## 2021-11-23 DIAGNOSIS — F17200 Nicotine dependence, unspecified, uncomplicated: Secondary | ICD-10-CM | POA: Diagnosis present

## 2021-11-23 DIAGNOSIS — I69320 Aphasia following cerebral infarction: Secondary | ICD-10-CM | POA: Diagnosis not present

## 2021-11-23 DIAGNOSIS — I471 Supraventricular tachycardia: Secondary | ICD-10-CM | POA: Diagnosis present

## 2021-11-23 DIAGNOSIS — I509 Heart failure, unspecified: Secondary | ICD-10-CM | POA: Diagnosis present

## 2021-11-23 DIAGNOSIS — E876 Hypokalemia: Secondary | ICD-10-CM | POA: Diagnosis present

## 2021-11-23 DIAGNOSIS — I110 Hypertensive heart disease with heart failure: Secondary | ICD-10-CM | POA: Diagnosis present

## 2021-11-23 DIAGNOSIS — E860 Dehydration: Secondary | ICD-10-CM | POA: Diagnosis present

## 2021-11-23 DIAGNOSIS — Z86718 Personal history of other venous thrombosis and embolism: Secondary | ICD-10-CM | POA: Diagnosis not present

## 2021-11-23 DIAGNOSIS — N920 Excessive and frequent menstruation with regular cycle: Secondary | ICD-10-CM | POA: Diagnosis present

## 2021-11-23 DIAGNOSIS — I69354 Hemiplegia and hemiparesis following cerebral infarction affecting left non-dominant side: Secondary | ICD-10-CM | POA: Diagnosis not present

## 2021-11-23 DIAGNOSIS — D500 Iron deficiency anemia secondary to blood loss (chronic): Secondary | ICD-10-CM | POA: Diagnosis present

## 2021-11-23 DIAGNOSIS — N179 Acute kidney failure, unspecified: Secondary | ICD-10-CM | POA: Diagnosis present

## 2021-11-23 DIAGNOSIS — E785 Hyperlipidemia, unspecified: Secondary | ICD-10-CM | POA: Diagnosis present

## 2021-11-23 DIAGNOSIS — I69322 Dysarthria following cerebral infarction: Secondary | ICD-10-CM | POA: Diagnosis not present

## 2021-11-23 DIAGNOSIS — R531 Weakness: Secondary | ICD-10-CM | POA: Diagnosis present

## 2021-11-23 LAB — BASIC METABOLIC PANEL
Anion gap: 8 (ref 5–15)
BUN: 45 mg/dL — ABNORMAL HIGH (ref 6–20)
CO2: 25 mmol/L (ref 22–32)
Calcium: 10.8 mg/dL — ABNORMAL HIGH (ref 8.9–10.3)
Chloride: 112 mmol/L — ABNORMAL HIGH (ref 98–111)
Creatinine, Ser: 0.94 mg/dL (ref 0.44–1.00)
GFR, Estimated: >60 mL/min (ref >60)
Glucose, Bld: 88 mg/dL (ref 70–99)
Potassium: 3.9 mmol/L (ref 3.5–5.1)
Sodium: 145 mmol/L (ref 135–145)

## 2021-11-23 LAB — CBC
HCT: 28.7 % — ABNORMAL LOW (ref 36.0–46.0)
Hemoglobin: 8.8 g/dL — ABNORMAL LOW (ref 12.0–15.0)
MCH: 25.6 pg — ABNORMAL LOW (ref 26.0–34.0)
MCHC: 30.7 g/dL (ref 30.0–36.0)
MCV: 83.4 fL (ref 80.0–100.0)
Platelets: 419 10*3/uL — ABNORMAL HIGH (ref 150–400)
RBC: 3.44 MIL/uL — ABNORMAL LOW (ref 3.87–5.11)
RDW: 24.3 % — ABNORMAL HIGH (ref 11.5–15.5)
WBC: 9.9 10*3/uL (ref 4.0–10.5)
nRBC: 0 % (ref 0.0–0.2)

## 2021-11-23 MED ORDER — SENNOSIDES-DOCUSATE SODIUM 8.6-50 MG PO TABS
1.0000 | ORAL_TABLET | Freq: Every day | ORAL | Status: DC
  Administered 2021-11-23 – 2021-11-24 (×2): 1 via ORAL
  Filled 2021-11-23 (×2): qty 1

## 2021-11-23 MED ORDER — ISOSORBIDE MONONITRATE ER 60 MG PO TB24
60.0000 mg | ORAL_TABLET | Freq: Every day | ORAL | Status: DC
  Administered 2021-11-23 – 2021-11-24 (×2): 60 mg via ORAL
  Filled 2021-11-23 (×2): qty 1

## 2021-11-23 MED ORDER — CARVEDILOL 12.5 MG PO TABS
25.0000 mg | ORAL_TABLET | Freq: Two times a day (BID) | ORAL | Status: DC
  Administered 2021-11-23 – 2021-11-24 (×4): 25 mg via ORAL
  Filled 2021-11-23 (×4): qty 2

## 2021-11-23 MED ORDER — ASPIRIN 81 MG PO CHEW
81.0000 mg | CHEWABLE_TABLET | Freq: Every day | ORAL | Status: DC
  Administered 2021-11-23 – 2021-11-24 (×2): 81 mg via ORAL
  Filled 2021-11-23 (×2): qty 1

## 2021-11-23 MED ORDER — POTASSIUM CHLORIDE 10 MEQ/100ML IV SOLN
10.0000 meq | INTRAVENOUS | Status: AC
  Administered 2021-11-23 (×2): 10 meq via INTRAVENOUS
  Filled 2021-11-23 (×2): qty 100

## 2021-11-23 MED ORDER — SODIUM CHLORIDE 0.9 % IV SOLN: INTRAVENOUS | Status: DC | PRN

## 2021-11-23 MED ORDER — ORAL CARE MOUTH RINSE
15.0000 mL | Freq: Two times a day (BID) | OROMUCOSAL | Status: DC
  Administered 2021-11-23 – 2021-11-24 (×3): 15 mL via OROMUCOSAL

## 2021-11-23 MED ORDER — SALINE SPRAY 0.65 % NA SOLN
1.0000 | NASAL | Status: DC | PRN
  Administered 2021-11-23 (×2): 1 via NASAL
  Filled 2021-11-23: qty 44

## 2021-11-23 MED ORDER — CLONIDINE HCL 0.1 MG PO TABS
0.1000 mg | ORAL_TABLET | Freq: Three times a day (TID) | ORAL | Status: DC
  Administered 2021-11-23 – 2021-11-24 (×5): 0.1 mg via ORAL
  Filled 2021-11-23 (×5): qty 1

## 2021-11-23 MED ORDER — PANTOPRAZOLE SODIUM 40 MG PO TBEC
40.0000 mg | DELAYED_RELEASE_TABLET | Freq: Every day | ORAL | Status: DC
  Administered 2021-11-24: 40 mg via ORAL
  Filled 2021-11-23: qty 1

## 2021-11-23 MED ORDER — HYDRALAZINE HCL 50 MG PO TABS
100.0000 mg | ORAL_TABLET | Freq: Three times a day (TID) | ORAL | Status: DC
  Administered 2021-11-23 – 2021-11-24 (×5): 100 mg via ORAL
  Filled 2021-11-23 (×5): qty 2

## 2021-11-23 NOTE — Progress Notes (Signed)
Dr. Jeanice Lim notified via [facility] regarding elevated BP. Received secure chat to allow for permissive HTN due to stroke work up. No new orders at this time.    11/23/21 0040  Vitals  Temp 98.6 F (37 C)  Temp Source Oral  BP (!) 185/131  MAP (mmHg) 145  BP Location Right Arm  BP Method Automatic  Patient Position (if appropriate) Lying  Pulse Rate Source Monitor  ECG Heart Rate 88  Resp 17  MEWS COLOR  MEWS Score Color Green  Oxygen Therapy  SpO2 100 %  O2 Device Room Air  MEWS Score  MEWS Temp 0  MEWS Systolic 0  MEWS Pulse 0  MEWS RR 0  MEWS LOC 0  MEWS Score 0  Provider Notification  Provider Bethanne Ginger, MD  Date Provider Notified 11/23/21  Time Provider Notified [phone]  Notification Type Page  Notification Reason Other (Comment) (elevated BP)

## 2021-11-23 NOTE — Progress Notes (Signed)
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  Overnight Events: NAEON  Interim History: Patient was seen and assessed at bedside this morning. On interview the patient reports feeling cold. She otherwise has difficulty communicating. She confirms that she is not experiencing any shortness of breath, but denies any improvement in her ABD pain.   OBJECTIVE:  Vital Signs: Vitals:   11/22/21 2152 11/23/21 0040 11/23/21 0323 11/23/21 0539  BP: (!) 169/94 (!) 185/131 (!) 204/107 (!) 193/92  Pulse:   88 69  Resp: 15 17 16 16   Temp:  98.6 F (37 C) 99.2 F (37.3 C) 99 F (37.2 C)  TempSrc:  Oral Oral Oral  SpO2: 99% 100% 99% 99%  Weight:      Height:          Intake/Output Summary (Last 24 hours) at 11/23/2021 0724 Last data filed at 11/23/2021 0329 Gross per 24 hour  Intake 2240 ml  Output 400 ml  Net 1840 ml    Net IO Since Admission: 2,940 mL [11/23/21 0724]  Physical Exam: Physical Exam Vitals reviewed.  Cardiovascular:     Rate and Rhythm: Normal rate and regular rhythm.     Pulses: Normal pulses.     Heart sounds: No murmur heard. Pulmonary:     Effort: Pulmonary effort is normal.     Breath sounds: Normal breath sounds.  Abdominal:     General: Bowel sounds are normal.     Palpations: Abdomen is soft.     Tenderness: There is no abdominal tenderness.  Neurological:     Mental Status: She is alert.     Comments: 1/5 strength in RUE, 1/5 in RLE    Patient Lines/Drains/Airways Status     Active Line/Drains/Airways     Name Placement date Placement time Site Days   Peripheral IV 11/22/21 22 G Right Hand 11/22/21  0015  Hand  less than 1   Peripheral IV 11/22/21 22 G Distal;Posterior;Right Forearm 11/22/21  0828  Forearm  less than 1   External  Urinary Catheter 10/26/21  1426  --  27   Pressure Injury 10/14/21 Buttocks Left Stage 2 -  Partial thickness loss of dermis presenting as a shallow open injury with a red, pink wound bed without slough. 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  Plan: #Aphasia/right sided weakness MRI negative for acute stroke. Uncertain etiology of new neuro deficits, but these may be more subacute in nature and represent general deconditioning. Neurology has mentioned a potential MRI-cervical spine to rule out nerve impingement.  -Neuro Consult, appreciate recs -Continue atorvastatin 40 mg daily, ASA 81 mg -Restart home anti-hypertensives as below -Dysphagia 1 diet with nectar thick liquids, per SLP  #HTN Home regimen of Coreg 25 mg BID, Clonidine 0.1 mg TID, Hydral 100 mg TID. Held for permissive hypertension. SBP from 150-204, likely in  part due to rebound HTN from stopping clonidine.  -Restart home Coreg 25 mg BID -Restart home Clonidine 0.1 mg TID -Restart home Hydralazine 100 gm TID   #Hx LLE DVT Patient with HX LLE DVT, was on Eliquis until hospitalization recently for uterine bleeding. She had an IVC filter placed and was taken off Eliquis. Vascular surgery notes indicate they want the filter to remain in place for 3-6 months until she is deemed stable, presumably with the intention of resuming anticoagulation after removal.  -OP vascular follow up -Consider addition of Eliquis pending Neuro workup  #Elevated troponin Trops from 90 to 101 to 118 to 111. Low concern for ACS given lack of CP and re-assuring EKG. -No further  testing  #Abdominal pain #Likely constipation KUB showing stool burden. -Miralax BID, can escalate as needed  #Covid positive Patient is asymptomatic: no dyspnea, satting well on RA, afebrile.  -Monitor respiratory status -Airborne and contact precautions  #AKI (resolved) Patient with Cr to 1.3 on a normal baseline. Cr has returned to normal. -daily BMP  #Paroxysmal SVT K of 3.9 this AM.  -Kcl 10 meq x2, IV -Continue home amio 200 mg daily.   FEN/GI: dysphagia 1 diet, with nectar thick liquids VTE: Lovenox Dispo: return to SNF pending medical workup Code Status: FULL  Signature: Corky Sox, MD PGY-1 Pager: [phone]  Please contact the on call pager after 5 pm and on weekends at [phone].

## 2021-11-23 NOTE — Progress Notes (Signed)
   11/23/21 0323  Assess: MEWS Score  Temp 99.2 F (37.3 C)  BP (!) 204/107  Pulse Rate 88  ECG Heart Rate 88  Resp 16  Level of Consciousness Alert  SpO2 99 %  O2 Device Room Air  Assess: MEWS Score  MEWS Temp 0  MEWS Systolic 2  MEWS Pulse 0  MEWS RR 0  MEWS LOC 0  MEWS Score 2  MEWS Score Color Yellow  Assess: if the MEWS score is Yellow or Red  Were vital signs taken at a resting state? Yes  Focused Assessment No change from prior assessment  Early Detection of Sepsis Score *See Row Information* Low  MEWS guidelines implemented *See Row Information* Yes  Treat  MEWS Interventions Other (Comment) (MD notiifed. Allowing permissive HTN due to stroke work up.)  Pain Scale 0-10  Pain Score 0  Take Vital Signs  Increase Vital Sign Frequency  Yellow: Q 2hr X 2 then Q 4hr X 2, if remains yellow, continue Q 4hrs  Escalate  MEWS: Escalate Yellow: discuss with charge nurse/RN and consider discussing with provider and RRT  Notify: Charge Nurse/RN  Name of Charge Nurse/RN Notified Wells Guiles, RN  Date Charge Nurse/RN Notified 11/23/21  Time Charge Nurse/RN Notified 0346  Notify: Provider  Provider Name/Title S. Jeanice Lim, MD  Date Provider Notified 11/23/21  Time Provider Notified [phone]  Notification Type Page  Notification Reason Other (Comment) (elevated BP above SBP 200.)  Provider response Evaluate remotely  Date of Provider Response 11/23/21  Time of Provider Response [phone]

## 2021-11-23 NOTE — TOC Initial Note (Signed)
Transition of Care [facility]) - Initial/Assessment Note    Patient Details  Name: Holly Bradley MRN: [id number] Date of Birth: [birth year]  Transition [facility]) CM/SW Contact:    Benard Halsted, [place] Phone Number: 11/23/2021, 3:33 PM  Clinical Narrative:                 CSW confirmed that patient is a long term resident at [facility] and she was COVID+ there on 11/15/21. CSW to continue to follow for discharge needs.   Expected Discharge Plan: Skilled Nursing Facility Barriers to Discharge: Continued Medical Work up   Patient Goals and CMS Choice Patient states their goals for this hospitalization and ongoing recovery are:: Return to SNF CMS Medicare.gov Compare Post Acute Care list provided to:: Patient Represenative (must comment) Choice offered to / list presented to : Sibling  Expected Discharge Plan and Services Expected Discharge Plan: [place] In-house Referral: Clinical Social Work   Post Acute Care Choice: [place] Living arrangements for the past 2 months: [place]                                      Prior Living Arrangements/Services Living arrangements for the past 2 months: [place] Lives with:: Facility Resident Patient language and need for interpreter reviewed:: Yes Do you feel safe going back to the place where you live?: Yes      Need for Family Participation in Patient Care: Yes (Comment) Care giver support system in place?: Yes (comment)   Criminal Activity/Legal Involvement Pertinent to Current Situation/Hospitalization: No - Comment as needed  Activities of Daily Living      Permission Sought/Granted Permission sought to share information with : Facility Sport and exercise psychologist, Family Supports    Share Information with NAME: [name]  Permission granted to share info w AGENCY: Database administrator granted to share info w Relationship: Brother  Permission granted to  share info w Contact Information: [phone]  Emotional Assessment       Orientation: : Oriented to Self, Oriented to Place, Oriented to  Time, Oriented to Situation Alcohol / Substance Use: Not Applicable Psych Involvement: No (comment)  Admission diagnosis:  Hypercalcemia [E83.52] Aphasia [R47.01] Weakness [R53.1] AKI (acute kidney injury) ([place]) [N17.9] Abdominal pain [R10.9] Patient Active Problem List   Diagnosis Date Noted   Weakness 11/22/2021   Pressure injury of skin 10/13/2021   Lupus anticoagulant positive    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)    Acute blood loss anemia 10/10/2021   Hypertension    Non-ischemic cardiomyopathy (HCC)    Swelling of left hand    Proctocolitis    Constipation    Uterine fibroid    Left hemiparesis (HCC)    Hemorrhagic stroke (HCC)    Abnormal uterine bleeding (AUB)    Staphylococcus epidermidis bacteremia    Iron deficiency anemia due to chronic blood loss 05/25/2021   Cerebral edema ([place]) 05/25/2021   Chronic HFrEF (heart failure with reduced ejection fraction) ([place]) 05/25/2021   Pneumonia due to methicillin susceptible Staphylococcus aureus ([place]) 05/25/2021   Acute respiratory failure with hypoxia ([place])    Acute right MCA stroke ([place]) 05/15/2021   Uncontrolled hypertension 05/15/2021   Cerebrovascular accident (CVA) ([place]) 05/15/2021   IDA (iron deficiency anemia) 03/01/2021   Healthcare maintenance 03/01/2021   Onychomycosis 03/01/2021   Food insecurity  02/02/2021   Psychosis ([place])    Fibroid 01/11/2021   Menorrhagia 01/11/2021   Acute exacerbation of CHF (congestive heart failure) ([place]) 01/10/2021   AKI (acute kidney injury) ([place])    Symptomatic anemia    SVT (supraventricular tachycardia) ([place]) 05/01/2020   CHF (congestive heart failure) ([place]) 05/01/2020   PCP:  Mitzi Hansen, MD Pharmacy:   [facility], [place] [address] Phone: [phone] Fax: [phone]  [id number] [place], [place] AT [address] [place] [address] Phone: [phone] Fax: [place] [address] [place] [address] Phone: [phone] Fax: [phone]     Social Determinants of Health (SDOH) Interventions    Readmission Risk Interventions No flowsheet data found.

## 2021-11-23 NOTE — Progress Notes (Signed)
Patient states that she resides at [facility]. Attempted x 2 to call the facility per Dr. Harvest Forest request. No answer with both attempts. Number called [phone].

## 2021-11-24 LAB — CBC
HCT: 27.1 % — ABNORMAL LOW (ref 36.0–46.0)
Hemoglobin: 8.3 g/dL — ABNORMAL LOW (ref 12.0–15.0)
MCH: 25.9 pg — ABNORMAL LOW (ref 26.0–34.0)
MCHC: 30.6 g/dL (ref 30.0–36.0)
MCV: 84.7 fL (ref 80.0–100.0)
Platelets: 379 10*3/uL (ref 150–400)
RBC: 3.2 MIL/uL — ABNORMAL LOW (ref 3.87–5.11)
RDW: 24.6 % — ABNORMAL HIGH (ref 11.5–15.5)
WBC: 8.2 10*3/uL (ref 4.0–10.5)
nRBC: 0 % (ref 0.0–0.2)

## 2021-11-24 NOTE — Hospital Course (Signed)
Wants to go back to [facility] today.   Denies chest pain, SHOB.  Had a BM earlier this morning. Denis any stomach pain.  Has anxiety, feels like she is going to fall backwards off the bed. Started after her stroke.   States that [facility] is for quick fixes, but [facility] knows her better and can take care of her long-term issues.

## 2021-11-24 NOTE — TOC Transition Note (Signed)
Transition of Care [facility]) - CM/SW Discharge Note   Patient Details  Name: Tamsin Nader MRN: [id number] Date of Birth: July 23, 1976  Transition [facility] CM/SW Contact:  Coralee Pesa, [place] Phone Number: 11/24/2021, 2:23 PM   Clinical Narrative:    Pt to be transported to [facility] via [place]. Nurse to call report to [phone].   Final next level of care: Skilled Nursing Facility Barriers to Discharge: Barriers Resolved   Patient Goals and CMS Choice Patient states their goals for this hospitalization and ongoing recovery are:: Return to SNF CMS Medicare.gov Compare Post Acute Care list provided to:: Patient Represenative (must comment) Choice offered to / list presented to : Sibling  Discharge Placement              Patient chooses bed at: [facility] Patient to be transferred to facility by: [place] Name of family member notified: Patient Patient and family notified of of transfer: 11/24/21  Discharge Plan and Services In-house Referral: Clinical Social Work   Post Acute Care Choice: [place]                               Social Determinants of Health (SDOH) Interventions     Readmission Risk Interventions No flowsheet data found.

## 2021-11-24 NOTE — Discharge Summary (Signed)
Name: Holly Bradley MRN: [id number] DOB: 1976-11-02 45 y.o. PCP: Mitzi Hansen, MD  Date of Admission: 11/21/2021 10:12 PM Date of Discharge: 11/24/2021 Attending Physician: Lucious Groves, DO  Discharge Diagnosis: 1. Aphasia, right sided weakness (resolved) 2. Hx R MCA CVA 3. Hx LLE DVT 4. HF with recovered EF 5. pSVT 6. Uterine fibroids s/p uterine artery embolization 7. HTN  Discharge Medications: Allergies as of 11/24/2021   No Known Allergies      Medication List     STOP taking these medications    Oxycodone HCl 10 MG Tabs   traMADol 50 MG tablet Commonly known as: ULTRAM       TAKE these medications    acetaminophen 500 MG tablet Commonly known as: TYLENOL Take 2 tablets (1,000 mg total) by mouth every 6 (six) hours.   amiodarone 200 MG tablet Commonly known as: PACERONE Take 200 mg by mouth daily.   aspirin 81 MG chewable tablet Chew 1 tablet (81 mg total) by mouth daily.   atorvastatin 40 MG tablet Commonly known as: LIPITOR Take 1 tablet (40 mg total) by mouth daily. What changed: when to take this   baclofen 20 MG tablet Commonly known as: LIORESAL Take 1 tablet (20 mg total) by mouth 3 (three) times daily.   bisacodyl 10 MG suppository Commonly known as: DULCOLAX Place 10 mg rectally See admin instructions. As needed for constipation x 1 dose if no relief from milk of mag What changed: Another medication with the same name was removed. Continue taking this medication, and follow the directions you see here.   carvedilol 25 MG tablet Commonly known as: COREG Take 1 tablet (25 mg total) by mouth 2 (two) times daily with a meal.   cloNIDine 0.1 MG tablet Commonly known as: CATAPRES Take 1 tablet (0.1 mg total) by mouth 3 (three) times daily.   Entresto 97-103 MG Generic drug: sacubitril-valsartan Take 1 tablet by mouth 2 (two) times daily.   escitalopram 20 MG tablet Commonly known as: LEXAPRO Take 20 mg by mouth daily.    feeding supplement (PRO-STAT SUGAR FREE 64) Liqd Take 30 mLs by mouth in the morning and at bedtime.   feeding supplement Liqd Take 237 mLs by mouth 3 (three) times daily between meals.   ferrous sulfate 325 (65 FE) MG tablet Take 1 tablet (325 mg total) by mouth 2 (two) times daily with a meal.   hydrALAZINE 100 MG tablet Commonly known as: APRESOLINE Take 1 tablet (100 mg total) by mouth 3 (three) times daily.   isosorbide mononitrate 60 MG 24 hr tablet Commonly known as: IMDUR Take 60 mg by mouth daily.   lactulose 10 GM/15ML solution Commonly known as: CHRONULAC Take 15 mLs (10 g total) by mouth 2 (two) times daily.   linaclotide 145 MCG Caps capsule Commonly known as: LINZESS Take 145 mcg by mouth daily before breakfast.   magnesium hydroxide 400 MG/5ML suspension Commonly known as: MILK OF MAGNESIA Take 30 mLs by mouth See admin instructions. As needed for constipation x 1 dose   mirtazapine 7.5 MG tablet Commonly known as: REMERON Take 7.5 mg by mouth at bedtime.   multivitamin with minerals Tabs tablet Take 1 tablet by mouth daily.   pantoprazole 40 MG tablet Commonly known as: PROTONIX Take 40 mg by mouth daily.   polyethylene glycol 17 g packet Commonly known as: MIRALAX / GLYCOLAX Take 17 g by mouth daily.   RA SALINE ENEMA RE Place 1 enema rectally See  admin instructions. As needed for constipation if no relief from bisacodyl suppository   senna-docusate 8.6-50 MG tablet Commonly known as: Senokot-S Take 2 tablets by mouth 2 (two) times daily.   simethicone 40 MG/0.6ML drops Commonly known as: MYLICON Take 0.6 mLs (40 mg total) by mouth every 6 (six) hours as needed for flatulence.   torsemide 20 MG tablet Commonly known as: DEMADEX TAKE 2 TABLETS (40 MG TOTAL) BY MOUTH 2 (TWO) TIMES DAILY. What changed:  how much to take how to take this when to take this               Discharge Care Instructions  (From admission, onward)            Start     Ordered   11/24/21 0000  Discharge wound care:       Comments: Per nursing   11/24/21 1357            Disposition and follow-up:   Ms.Zenovia Sallade was discharged from [facility] in Stable condition.  At the hospital follow up visit please address:  1.  Vascular surgery follow up for removal of IVC filter and resumption of Eliquis. Consider tapering of diuretic regimen given recovered EF and euvolemia.   2.  Labs / imaging needed at time of follow-up: NA  3.  Pending labs/ test needing follow-up: NA  Follow-up Appointments:   Hospital Course by problem list:  Aphasia/right sided weakness Patient was reportedly very functional before a severe R MCA stroke that resulted in L sided hemiplegia and dysarthria in May of 2022. She has since been residing at [facility]. She was brought to ED with reports of new onset R lower extremity and R upper extremity weakness in addition to aphasia. CT head was negative for acute anomaly. On examination she exhibited little speech and had 1/5 strength in RUE/RLE. It was thought that both of these findings may be due to poor participation as the patient was alert and oriented and conversing well with nursing. MRI-brain was negative for acute stroke. EEG was unremarkable for epileptiform activity. Neurology recommended no further workup. On 12/3 the patient reportedly complained to nursing about continued hospitalization. When the patient was seen and examined that day she appeared markedly improved from admission. Her speech was spontaneous, albeit dysarthric, and she exhibited 4/5 strength in her RUE and RLE and had good grip strength.   Hx LLE DVT Patient was on Eliquis for a LLE DVT. This was discontinued during a recent hospitalization for uterine bleeding in October. At that time she presented with a hgb of 2.9 and required uterine artery embolization. Due to her not being able to resume Eliquis, an  IVC filter was placed to prevent PE. Per vascular surgery, this can be removed 3-6 months after placement, presumably with the intention of resuming Eliquis at that point. (It is described in the vascular notes as a "permanent" filter that can be removed).   HF with recovered EF, HTN EF of 55-60% with grade II diastolic dysfunction and severe left atrial enlargement in May of 2022. Her home antihypertensives and Torsemide 40 mg BID were initially held for permissive hypertension in the context of stroke work up. After 24 hours, the antihypertensives were added back with normalization of her blood pressure. Before discharge the patient developed hypertension to systolic blood pressure of 180, thought to be due to anxiety. Her home torsemide was not continued on this admission but was scheduled to be  continued on discharge. Consider reducing the dose of this medication.   Paroxysmal SVT Patient has hx of paroxsymal SVT and is on Amiodarone 200 mg qd. She was in NSR on physical exam.  Covid positive Patient was Covid positive at facility before arrival. She remained asymptomatic and required no oxygen.    Discharge Exam:   BP 139/63 (BP Location: Left Arm)   Pulse 60   Temp 97.8 F (36.6 C) (Oral)   Resp 20   Ht 5\' 5"  (1.651 m)   Wt 63.9 kg   LMP  (LMP Unknown)   SpO2 100%   BMI 23.44 kg/m  Discharge exam:  Physical Exam Vitals reviewed.  Cardiovascular:     Rate and Rhythm: Normal rate and regular rhythm.     Pulses: Normal pulses.     Heart sounds: No murmur heard. Pulmonary:     Effort: Pulmonary effort is normal.     Breath sounds: Normal breath sounds.  Abdominal:     General: Bowel sounds are normal.     Comments: Relatively firm abdomen  Neurological:     Mental Status: She is alert.     Comments: Speech dysarthric but comprehensible RUE/RLE strength is 4/5, with good grip strength L sided hemiplegia     Pertinent Labs, Studies, and Procedures:  MRI-brain negative  for acute stroke, EEG negative for seizure.   Discharge Instructions: Discharge Instructions     Diet - low sodium heart healthy   Complete by: As directed    Discharge wound care:   Complete by: As directed    Per nursing   Increase activity slowly   Complete by: As directed        Signed: Corky Sox, MD PGY-1

## 2021-11-24 NOTE — Plan of Care (Signed)

## 2021-12-03 ENCOUNTER — Emergency Department (HOSPITAL_COMMUNITY): Payer: Medicaid Other

## 2021-12-03 ENCOUNTER — Other Ambulatory Visit: Payer: Self-pay

## 2021-12-03 ENCOUNTER — Inpatient Hospital Stay (HOSPITAL_COMMUNITY)
Admission: EM | Admit: 2021-12-03 | Discharge: 2021-12-13 | DRG: 071 | Disposition: A | Discharge status: Skilled Nursing Facility | Payer: Medicaid Other | Source: Skilled Nursing Facility | Attending: Internal Medicine | Admitting: Internal Medicine

## 2021-12-03 ENCOUNTER — Encounter (HOSPITAL_COMMUNITY): Payer: Self-pay | Admitting: Emergency Medicine

## 2021-12-03 ENCOUNTER — Observation Stay (HOSPITAL_COMMUNITY): Payer: Medicaid Other

## 2021-12-03 DIAGNOSIS — Z7401 Bed confinement status: Secondary | ICD-10-CM

## 2021-12-03 DIAGNOSIS — I69354 Hemiplegia and hemiparesis following cerebral infarction affecting left non-dominant side: Secondary | ICD-10-CM

## 2021-12-03 DIAGNOSIS — E860 Dehydration: Secondary | ICD-10-CM | POA: Diagnosis present

## 2021-12-03 DIAGNOSIS — N921 Excessive and frequent menstruation with irregular cycle: Secondary | ICD-10-CM | POA: Diagnosis present

## 2021-12-03 DIAGNOSIS — E876 Hypokalemia: Secondary | ICD-10-CM | POA: Diagnosis present

## 2021-12-03 DIAGNOSIS — R509 Fever, unspecified: Secondary | ICD-10-CM

## 2021-12-03 DIAGNOSIS — I69391 Dysphagia following cerebral infarction: Secondary | ICD-10-CM

## 2021-12-03 DIAGNOSIS — Z515 Encounter for palliative care: Secondary | ICD-10-CM

## 2021-12-03 DIAGNOSIS — N179 Acute kidney failure, unspecified: Secondary | ICD-10-CM | POA: Diagnosis present

## 2021-12-03 DIAGNOSIS — R7989 Other specified abnormal findings of blood chemistry: Secondary | ICD-10-CM | POA: Diagnosis present

## 2021-12-03 DIAGNOSIS — N920 Excessive and frequent menstruation with regular cycle: Secondary | ICD-10-CM | POA: Diagnosis present

## 2021-12-03 DIAGNOSIS — N719 Inflammatory disease of uterus, unspecified: Secondary | ICD-10-CM | POA: Diagnosis present

## 2021-12-03 DIAGNOSIS — I471 Supraventricular tachycardia: Secondary | ICD-10-CM | POA: Diagnosis present

## 2021-12-03 DIAGNOSIS — Z79899 Other long term (current) drug therapy: Secondary | ICD-10-CM

## 2021-12-03 DIAGNOSIS — N898 Other specified noninflammatory disorders of vagina: Secondary | ICD-10-CM | POA: Diagnosis present

## 2021-12-03 DIAGNOSIS — E785 Hyperlipidemia, unspecified: Secondary | ICD-10-CM | POA: Diagnosis present

## 2021-12-03 DIAGNOSIS — D62 Acute posthemorrhagic anemia: Secondary | ICD-10-CM | POA: Diagnosis present

## 2021-12-03 DIAGNOSIS — Z86718 Personal history of other venous thrombosis and embolism: Secondary | ICD-10-CM

## 2021-12-03 DIAGNOSIS — Z20822 Contact with and (suspected) exposure to covid-19: Secondary | ICD-10-CM | POA: Diagnosis present

## 2021-12-03 DIAGNOSIS — I5032 Chronic diastolic (congestive) heart failure: Secondary | ICD-10-CM | POA: Diagnosis present

## 2021-12-03 DIAGNOSIS — I69320 Aphasia following cerebral infarction: Secondary | ICD-10-CM

## 2021-12-03 DIAGNOSIS — I110 Hypertensive heart disease with heart failure: Secondary | ICD-10-CM | POA: Diagnosis present

## 2021-12-03 DIAGNOSIS — Z66 Do not resuscitate: Secondary | ICD-10-CM | POA: Diagnosis present

## 2021-12-03 DIAGNOSIS — G9340 Encephalopathy, unspecified: Secondary | ICD-10-CM | POA: Diagnosis present

## 2021-12-03 DIAGNOSIS — G9341 Metabolic encephalopathy: Principal | ICD-10-CM | POA: Diagnosis present

## 2021-12-03 DIAGNOSIS — L8990 Pressure ulcer of unspecified site, unspecified stage: Secondary | ICD-10-CM | POA: Diagnosis present

## 2021-12-03 DIAGNOSIS — R569 Unspecified convulsions: Secondary | ICD-10-CM | POA: Diagnosis present

## 2021-12-03 DIAGNOSIS — D251 Intramural leiomyoma of uterus: Secondary | ICD-10-CM | POA: Diagnosis present

## 2021-12-03 DIAGNOSIS — S300XXA Contusion of lower back and pelvis, initial encounter: Secondary | ICD-10-CM | POA: Diagnosis present

## 2021-12-03 DIAGNOSIS — I700 Atherosclerosis of aorta: Secondary | ICD-10-CM | POA: Diagnosis present

## 2021-12-03 DIAGNOSIS — I10 Essential (primary) hypertension: Secondary | ICD-10-CM | POA: Diagnosis present

## 2021-12-03 DIAGNOSIS — R102 Pelvic and perineal pain: Secondary | ICD-10-CM | POA: Diagnosis present

## 2021-12-03 DIAGNOSIS — R1310 Dysphagia, unspecified: Secondary | ICD-10-CM | POA: Diagnosis present

## 2021-12-03 DIAGNOSIS — F32A Depression, unspecified: Secondary | ICD-10-CM | POA: Diagnosis present

## 2021-12-03 DIAGNOSIS — Z7982 Long term (current) use of aspirin: Secondary | ICD-10-CM

## 2021-12-03 LAB — I-STAT VENOUS BLOOD GAS, ED
Acid-Base Excess: 3 mmol/L — ABNORMAL HIGH (ref 0.0–2.0)
Bicarbonate: 28.6 mmol/L — ABNORMAL HIGH (ref 20.0–28.0)
Calcium, Ion: 1.36 mmol/L (ref 1.15–1.40)
HCT: 31 % — ABNORMAL LOW (ref 36.0–46.0)
Hemoglobin: 10.5 g/dL — ABNORMAL LOW (ref 12.0–15.0)
O2 Saturation: 99 %
Potassium: 4.2 mmol/L (ref 3.5–5.1)
Sodium: 141 mmol/L (ref 135–145)
TCO2: 30 mmol/L (ref 22–32)
pCO2, Ven: 46.5 mmHg (ref 44.0–60.0)
pH, Ven: 7.397 (ref 7.250–7.430)
pO2, Ven: 134 mmHg — ABNORMAL HIGH (ref 32.0–45.0)

## 2021-12-03 LAB — CBC WITH DIFFERENTIAL/PLATELET
Abs Immature Granulocytes: 0.14 10*3/uL — ABNORMAL HIGH (ref 0.00–0.07)
Basophils Absolute: 0 10*3/uL (ref 0.0–0.1)
Basophils Relative: 0 %
Eosinophils Absolute: 0.1 10*3/uL (ref 0.0–0.5)
Eosinophils Relative: 0 %
HCT: 29.1 % — ABNORMAL LOW (ref 36.0–46.0)
Hemoglobin: 8.9 g/dL — ABNORMAL LOW (ref 12.0–15.0)
Immature Granulocytes: 1 %
Lymphocytes Relative: 4 %
Lymphs Abs: 0.8 10*3/uL (ref 0.7–4.0)
MCH: 25.4 pg — ABNORMAL LOW (ref 26.0–34.0)
MCHC: 30.6 g/dL (ref 30.0–36.0)
MCV: 83.1 fL (ref 80.0–100.0)
Monocytes Absolute: 0.9 10*3/uL (ref 0.1–1.0)
Monocytes Relative: 5 %
Neutro Abs: 15.7 10*3/uL — ABNORMAL HIGH (ref 1.7–7.7)
Neutrophils Relative %: 90 %
Platelets: 452 10*3/uL — ABNORMAL HIGH (ref 150–400)
RBC: 3.5 MIL/uL — ABNORMAL LOW (ref 3.87–5.11)
RDW: 24.7 % — ABNORMAL HIGH (ref 11.5–15.5)
WBC: 17.6 10*3/uL — ABNORMAL HIGH (ref 4.0–10.5)
nRBC: 0 % (ref 0.0–0.2)

## 2021-12-03 LAB — URINALYSIS, ROUTINE W REFLEX MICROSCOPIC
Bilirubin Urine: NEGATIVE
Glucose, UA: NEGATIVE mg/dL
Hgb urine dipstick: NEGATIVE
Ketones, ur: NEGATIVE mg/dL
Leukocytes,Ua: NEGATIVE
Nitrite: NEGATIVE
Protein, ur: 100 mg/dL — AB
Specific Gravity, Urine: 1.02 (ref 1.005–1.030)
pH: 5.5 (ref 5.0–8.0)

## 2021-12-03 LAB — COMPREHENSIVE METABOLIC PANEL
ALT: 13 U/L (ref 0–44)
AST: 24 U/L (ref 15–41)
Albumin: 3.2 g/dL — ABNORMAL LOW (ref 3.5–5.0)
Alkaline Phosphatase: 205 U/L — ABNORMAL HIGH (ref 38–126)
Anion gap: 14 (ref 5–15)
BUN: 96 mg/dL — ABNORMAL HIGH (ref 6–20)
CO2: 23 mmol/L (ref 22–32)
Calcium: 11.7 mg/dL — ABNORMAL HIGH (ref 8.9–10.3)
Chloride: 102 mmol/L (ref 98–111)
Creatinine, Ser: 2.5 mg/dL — ABNORMAL HIGH (ref 0.44–1.00)
GFR, Estimated: 23 mL/min — ABNORMAL LOW (ref >60)
Glucose, Bld: 89 mg/dL (ref 70–99)
Potassium: 4.2 mmol/L (ref 3.5–5.1)
Sodium: 139 mmol/L (ref 135–145)
Total Bilirubin: 0.5 mg/dL (ref 0.3–1.2)
Total Protein: 9.6 g/dL — ABNORMAL HIGH (ref 6.5–8.1)

## 2021-12-03 LAB — I-STAT BETA HCG BLOOD, ED (MC, WL, AP ONLY): I-stat hCG, quantitative: <5 m[IU]/mL (ref <5)

## 2021-12-03 LAB — AMMONIA: Ammonia: 58 umol/L — ABNORMAL HIGH (ref 9–35)

## 2021-12-03 LAB — RESP PANEL BY RT-PCR (FLU A&B, COVID) ARPGX2
Influenza A by PCR: NEGATIVE
Influenza B by PCR: NEGATIVE
SARS Coronavirus 2 by RT PCR: NEGATIVE

## 2021-12-03 LAB — APTT: aPTT: 38 seconds — ABNORMAL HIGH (ref 24–36)

## 2021-12-03 LAB — URINALYSIS, MICROSCOPIC (REFLEX)

## 2021-12-03 LAB — PROTIME-INR
INR: 1.2 (ref 0.8–1.2)
Prothrombin Time: 15.4 seconds — ABNORMAL HIGH (ref 11.4–15.2)

## 2021-12-03 LAB — LACTIC ACID, PLASMA: Lactic Acid, Venous: 1.5 mmol/L (ref 0.5–1.9)

## 2021-12-03 IMAGING — MR MR HEAD W/O CM
14 of 15 series · 44 of 48 positions shown · non-contrast
Comparison: [DATE] MRI, correlation is also made with
[DATE] CT head

CLINICAL DATA: Mental status change

EXAM:
MRI HEAD WITHOUT CONTRAST
TECHNIQUE: Multiplanar, multiecho pulse sequences of the brain and surrounding
structures were obtained without intravenous contrast.

[Series 5: DWI · axial · 3.0mm · 0.88mm/px · z∈[-150,-16]mm · 5 of 96 slices shown (1 of 6)]
[im 1/96]
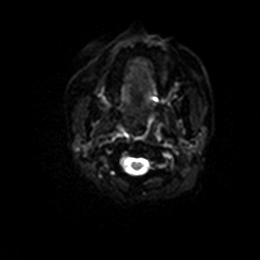
[im 24/96]
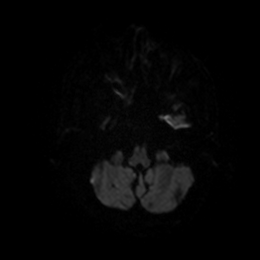
[im 48/96]
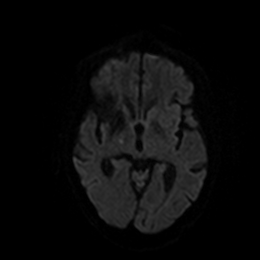
[im 72/96]
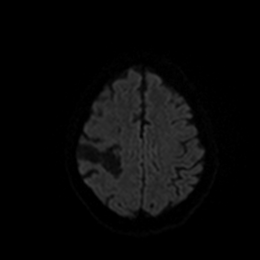
[im 96/96]
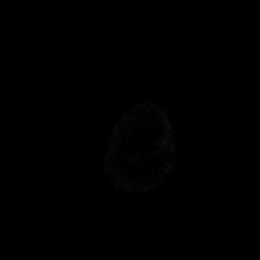

[Series 6: DWI · axial · 3.0mm · 0.88mm/px · z∈[-150,-16]mm · 3 of 48 slices shown (2 of 6)]
[im 1/48]
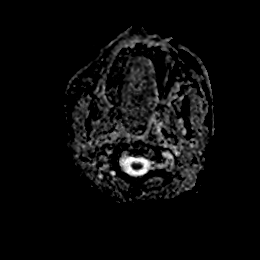
[im 24/48]
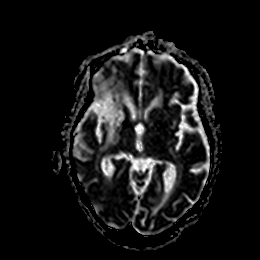
[im 48/48]
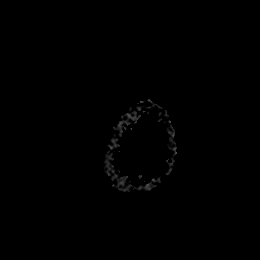

[Series 7: DWI · coronal · 4.0mm · 0.88mm/px · 4 of 68 slices shown (3 of 6)]
[im 1/68]
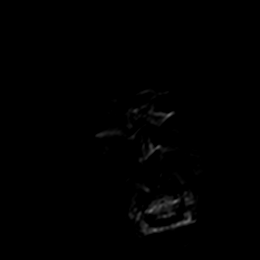
[im 23/68]
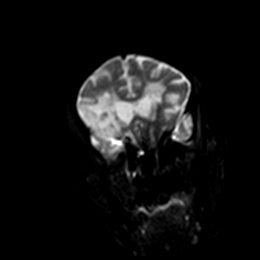
[im 45/68]
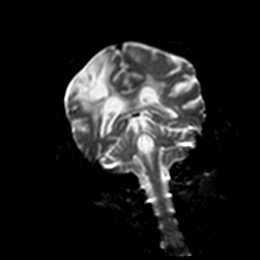
[im 68/68]
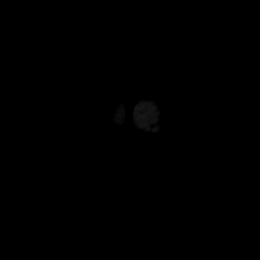

[Series 8: DWI · coronal · 4.0mm · 0.88mm/px · 2 of 34 slices shown (4 of 6)]
[im 1/34]
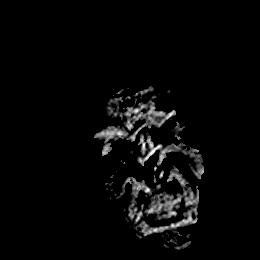
[im 34/34]
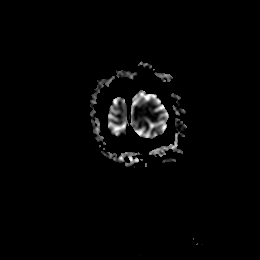

[Series 9: T1 · sagittal · 5.0mm · 0.75mm/px · 1 of 23 slices shown]
[im 1/23]
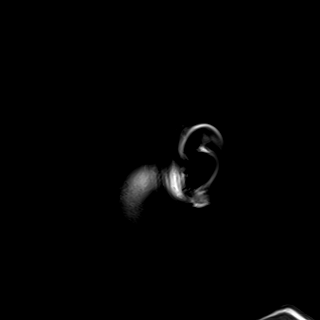

[Series 10: T2 · axial · 5.0mm · 0.72mm/px · z∈[-156,-13]mm · 2 of 26 slices shown (1 of 2)]
[im 1/26]
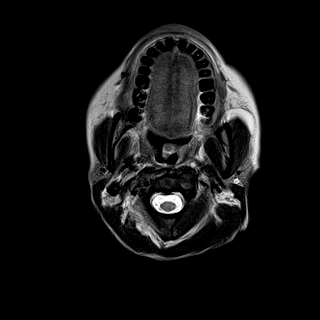
[im 26/26]
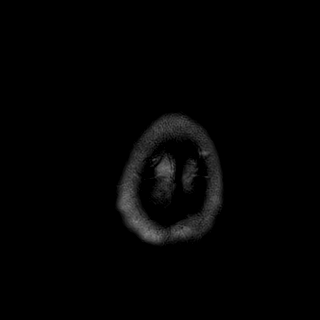

[Series 11: FLAIR · axial · 5.0mm · 0.90mm/px · z∈[-158,-13]mm · 2 of 26 slices shown]
[im 1/26]
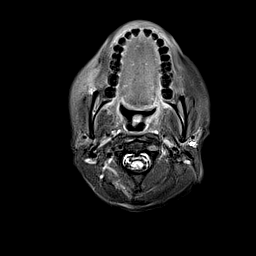
[im 26/26]
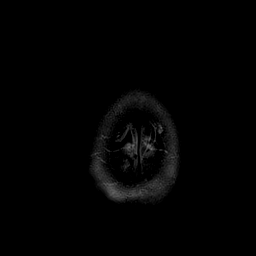

[Series 12: mag_images · axial · 3.0mm · 0.90mm/px · z∈[-166,+5]mm · 4 of 60 slices shown]
[im 1/60]
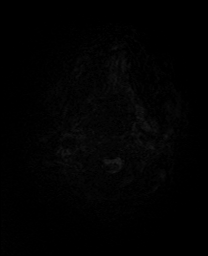
[im 20/60]
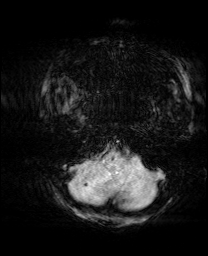
[im 40/60]
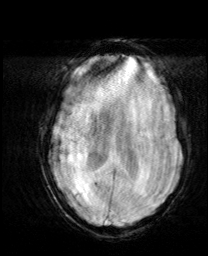
[im 60/60]
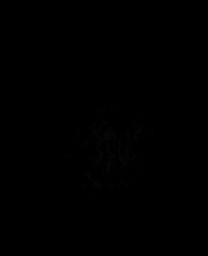

[Series 13: pha_images · axial · 3.0mm · 0.90mm/px · z∈[-166,-4]mm · 3 of 57 slices shown]
[im 1/57]
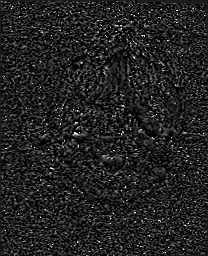
[im 29/57]
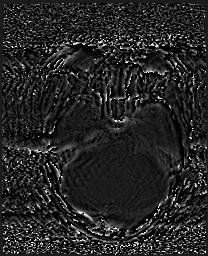
[im 57/57]
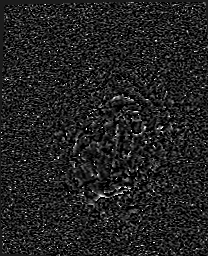

[Series 14: swi_images · axial · 3.0mm · 0.90mm/px · z∈[-166,+5]mm · 4 of 60 slices shown]
[im 1/60]
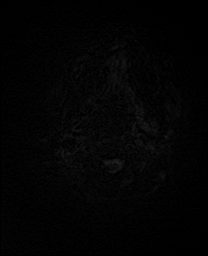
[im 20/60]
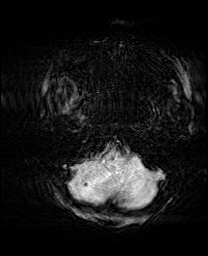
[im 40/60]
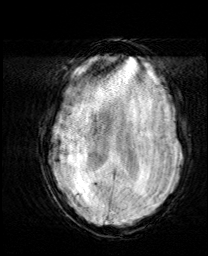
[im 60/60]
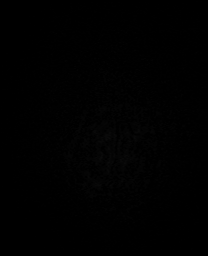

[Series 15: mip_images(sw) · axial · 24.0mm · 0.90mm/px · z∈[-156,-5]mm · 3 of 53 slices shown]
[im 1/53]
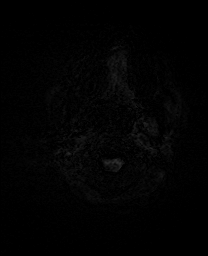
[im 27/53]
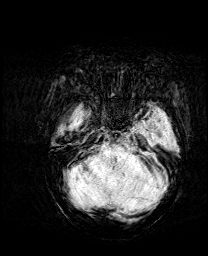
[im 53/53]
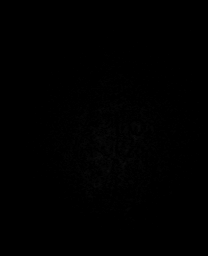

[Series 17: T2 · coronal · 5.0mm · 0.72mm/px · 2 of 32 slices shown (2 of 2)]
[im 1/32]
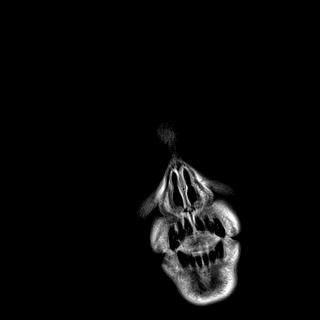
[im 32/32]
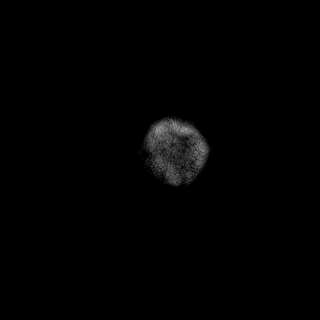

[Series 18: DWI · axial · 3.0mm · 0.88mm/px · z∈[-156,-11]mm · 6 of 104 slices shown (5 of 6)]
[im 1/104]
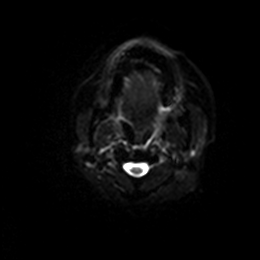
[im 21/104]
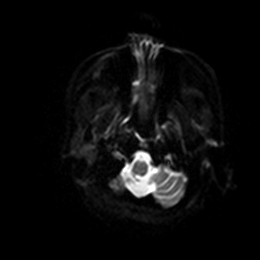
[im 42/104]
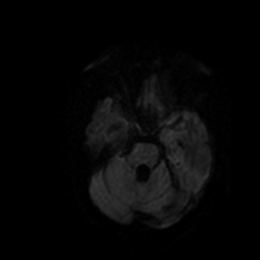
[im 62/104]
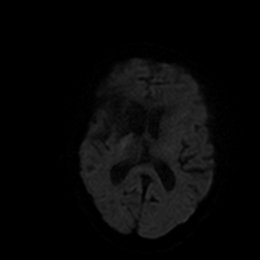
[im 83/104]
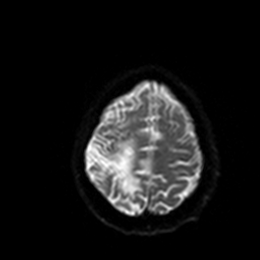
[im 104/104]
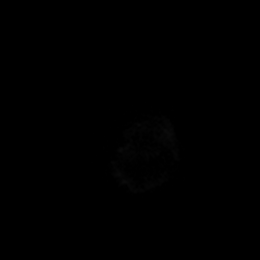

[Series 19: DWI · axial · 3.0mm · 0.88mm/px · z∈[-156,-11]mm · 3 of 52 slices shown (6 of 6)]
[im 1/52]
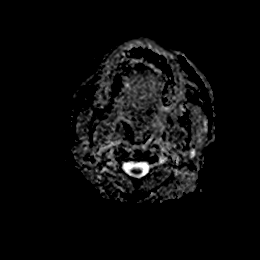
[im 26/52]
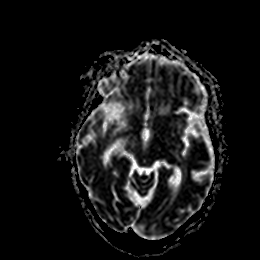
[im 52/52]
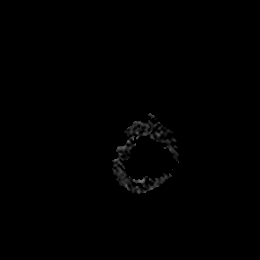

[44 of 48 positions shown; findings below may reference images not displayed]

FINDINGS: Evaluation is limited by motion artifact.

Brain: No restricted diffusion to suggest acute infarct.
Redemonstrated extensive encephalomalacia in the right frontal,
temporal, and parietal lobe, with additional lacunar infarcts in the
cerebellum, pons, and basal ganglia. Confluent T2 hyperintense
signal in the periventricular white matter, likely the sequela of
severe chronic small vessel ischemic disease. Mild ex vacuo
dilatation of the right lateral ventricle. No hydrocephalus or
extra-axial collection. No acute hemorrhage, mass, mass effect, or
midline shift. Findings consistent with wallerian degeneration in
the right corticospinal tracts.

Vascular: Normal flow voids.

Skull and upper cervical spine: Normal marrow signal.

Sinuses/Orbits: Mucosal thickening throughout the paranasal sinuses,
most prominent in the right maxillary sinus. The orbits are
unremarkable.

Other: The mastoids are well aerated.
IMPRESSION: No acute intracranial process.

## 2021-12-03 IMAGING — DX DG CHEST 1V PORT
1 series · 1 of 1 positions shown · non-contrast
Comparison: [DATE]

CLINICAL DATA: Febrile, sepsis

EXAM:
PORTABLE CHEST 1 VIEW

[chest ap]
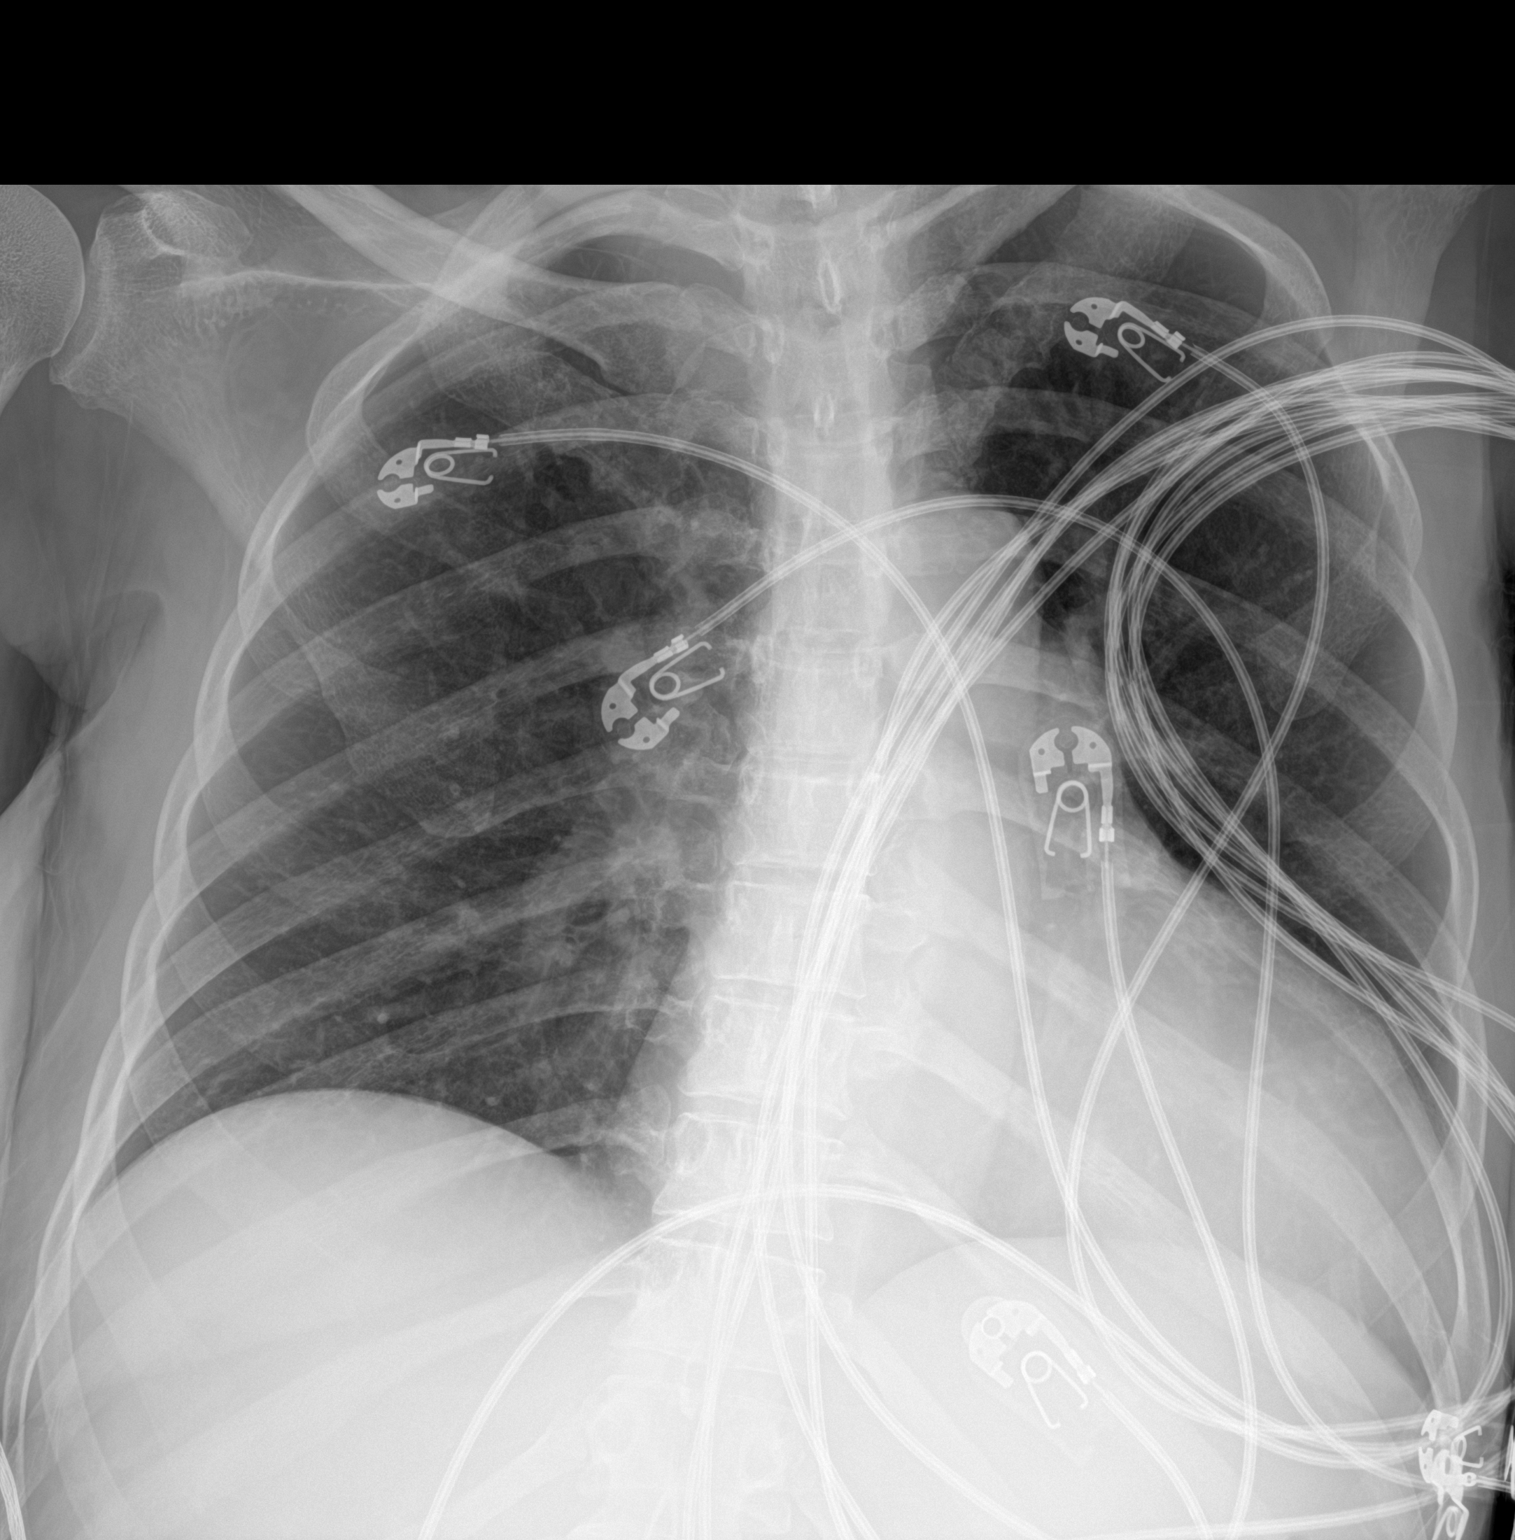

[1 of 1 positions shown; findings below may reference images not displayed]

FINDINGS: Single frontal view of the chest demonstrates an unremarkable
cardiac silhouette. No airspace disease, effusion, or pneumothorax.
No acute bony abnormality.
IMPRESSION: 1. No acute intrathoracic process.

## 2021-12-03 IMAGING — DX DG ABD PORTABLE 1V
1 series · 1 of 1 positions shown · non-contrast
Comparison: [DATE]

CLINICAL DATA: Febrile, sepsis, abdominal swelling

EXAM:
PORTABLE ABDOMEN - 1 VIEW

[abdomen kub]
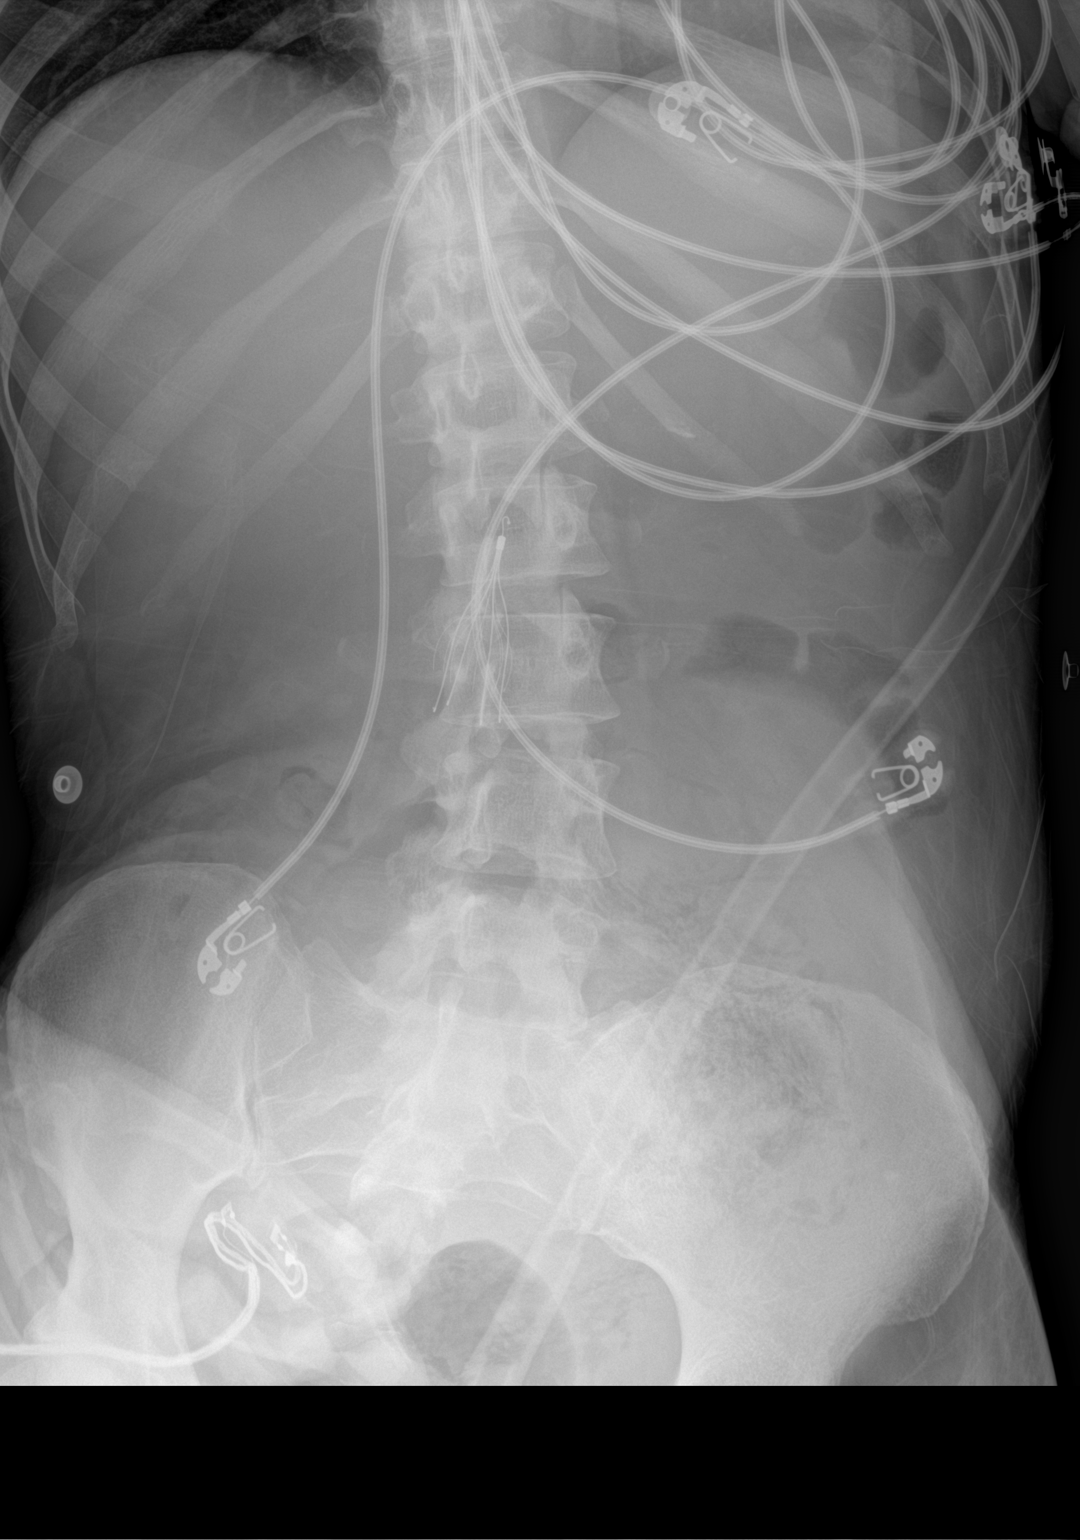

[1 of 1 positions shown; findings below may reference images not displayed]

FINDINGS: Supine frontal view of the abdomen and pelvis was performed. Stable
IVC filter at the L3 level. No bowel obstruction or ileus. No masses
or abnormal calcifications. No acute bony abnormalities.
IMPRESSION: 1. Unremarkable bowel gas pattern.

## 2021-12-03 IMAGING — CT CT HEAD W/O CM
4 of 5 series · 15 of 47 positions shown, 17 images · non-contrast
Comparison: [DATE].

CLINICAL DATA: Mental status change, unknown cause

EXAM:
CT HEAD WITHOUT CONTRAST
TECHNIQUE: Contiguous axial images were obtained from the base of the skull
through the vertex without intravenous contrast.

[Series 3: head without · axial · non-contrast · 0.40mm/px · z∈[-44,+46]mm · 4 of 31 slices shown]
[im 7/31  brain]
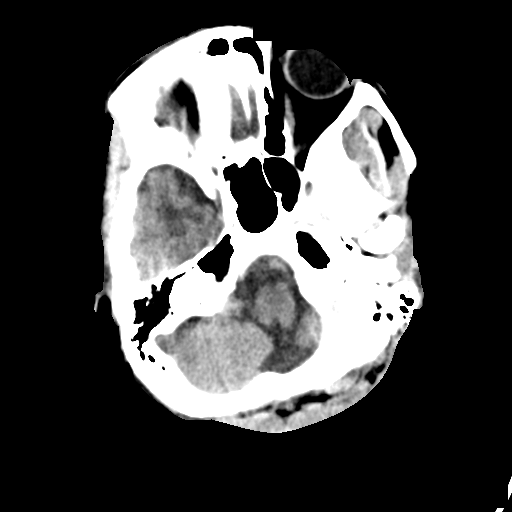
[im 13/31  brain]
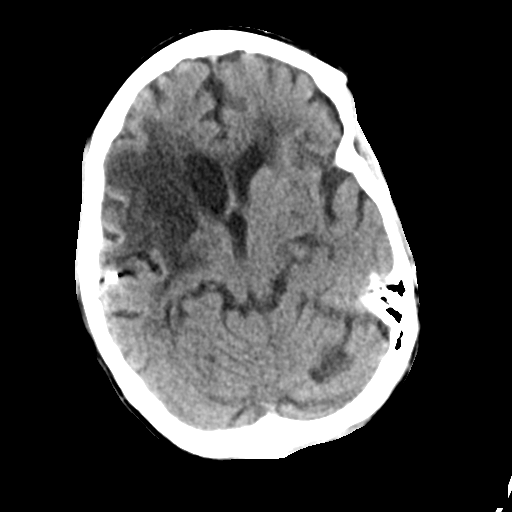
[im 19/31  brain]
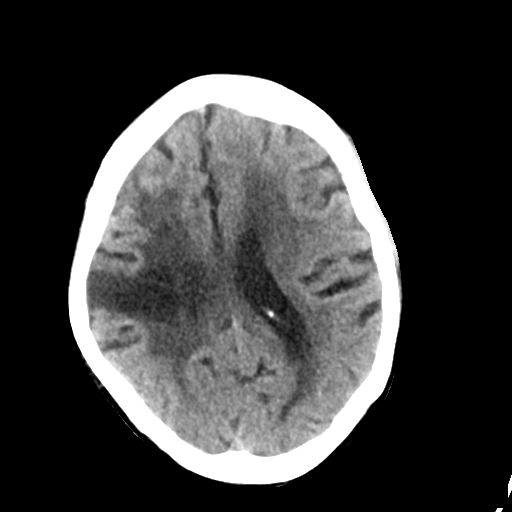
[im 25/31  brain]
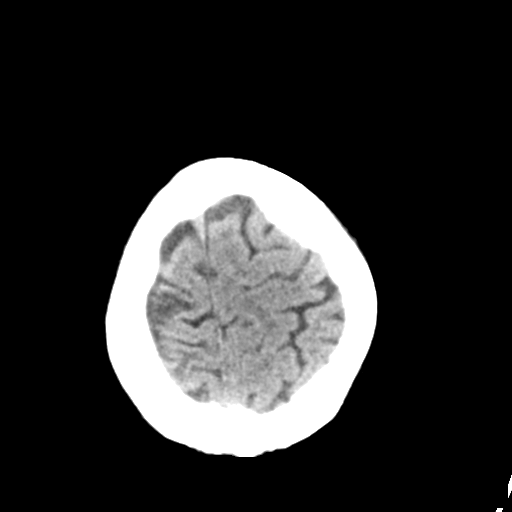

[Series 4: head without ax · axial · non-contrast · 0.40mm/px · z∈[-60,+34]mm · 5 of 31 slices shown, 7 images]
[im 6/31  brain]
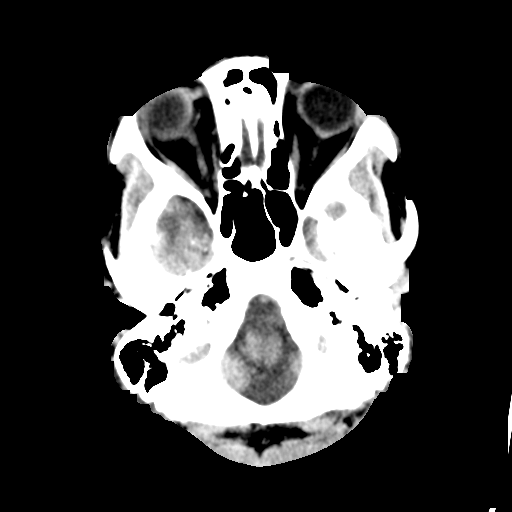
[im 6/31  bone]
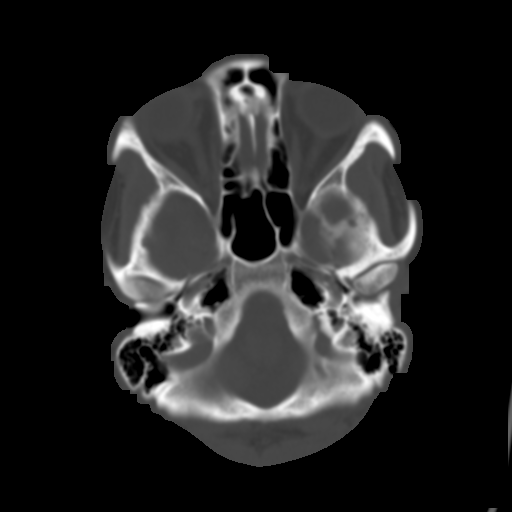
[im 11/31  brain]
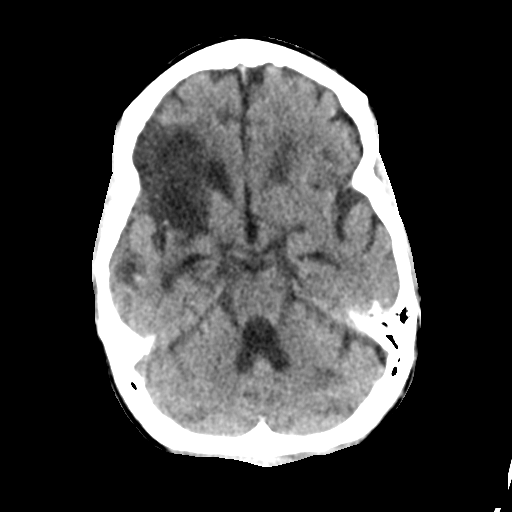
[im 16/31  brain]
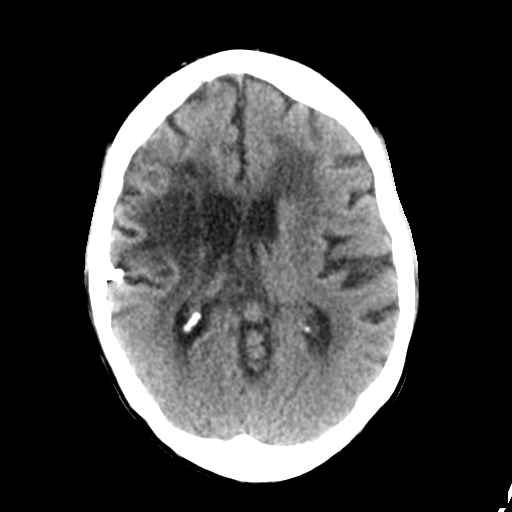
[im 21/31  brain]
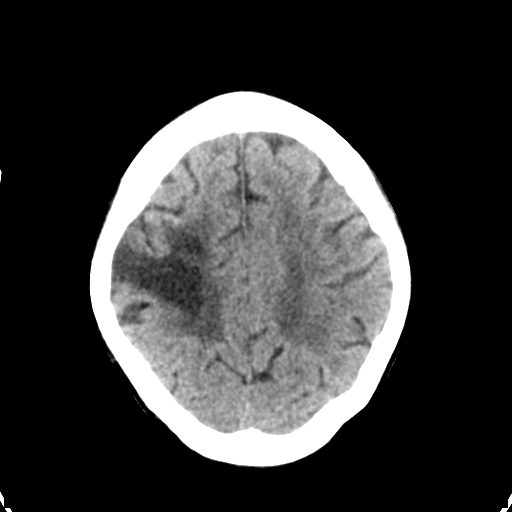
[im 26/31  brain]
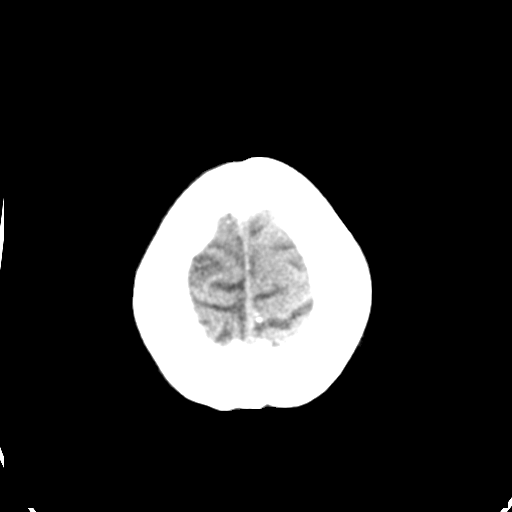
[im 26/31  bone]
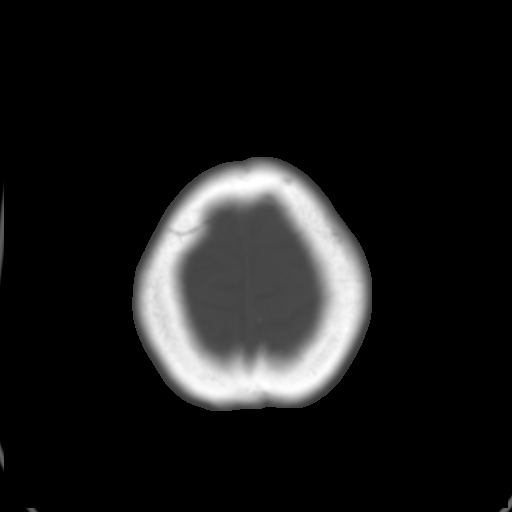

[Series 6: head without cor · coronal · non-contrast · 0.30mm/px · 3 of 66 slices shown]
[im 22/66  brain]
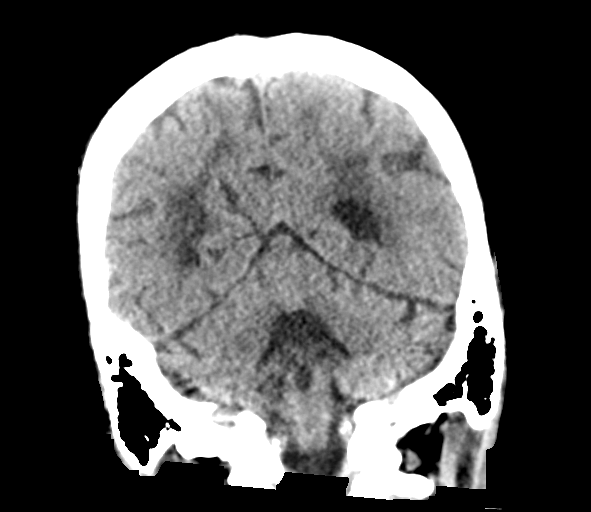
[im 29/66  brain]
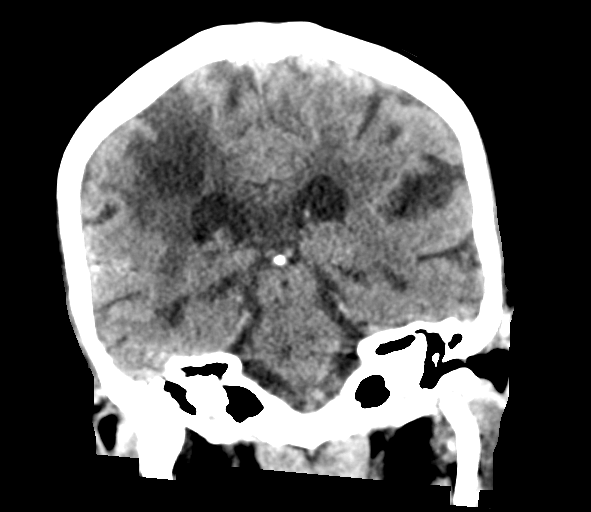
[im 37/66  brain]
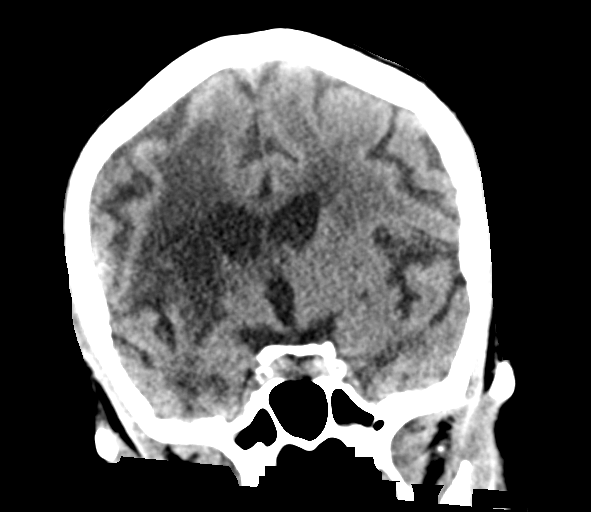

[Series 7: head without sag · sagittal · non-contrast · 0.30mm/px · 3 of 48 slices shown]
[im 18/48  brain]
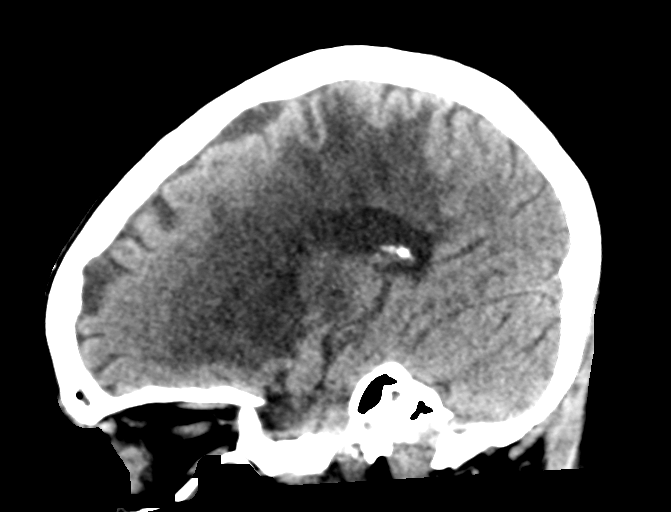
[im 24/48  brain]
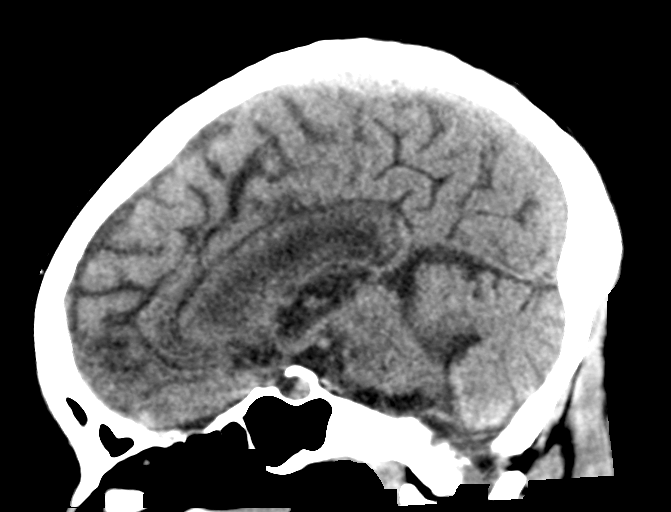
[im 31/48  brain]
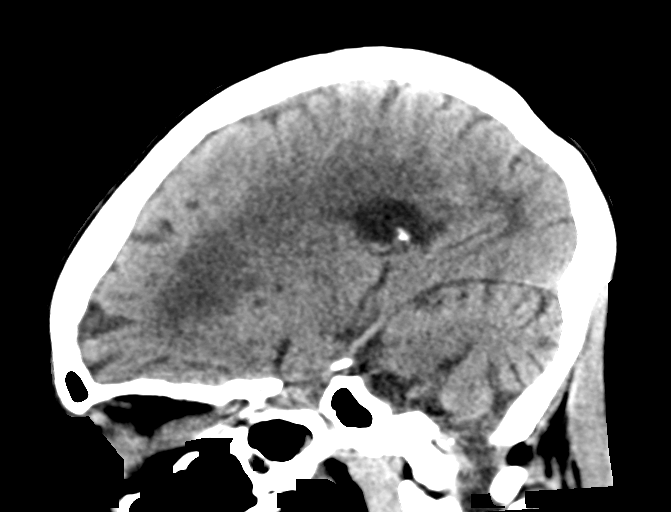

[15 of 47 positions shown; findings below may reference images not displayed]

FINDINGS: Brain: Encephalomalacia in the right MCA territory, similar. Right
brainstem Wallerian degeneration. Embolization coils are again noted
in the posterosuperior right temporal lobe. Additional patchy white
matter hypoattenuation, nonspecific but compatible with chronic
microvascular ischemic disease. Remote infarcts the left basal
ganglia and left cerebellum. No evidence of acute hemorrhage, mass
lesion, new abnormal mass effect, or extra-axial fluid collection.
Ex vacuo ventricular dilation of the right lateral ventricle,
similar. No hydrocephalus.

Vascular: No hyperdense vessel identified.

Skull: No acute fracture.

Sinuses/Orbits: Moderate paranasal sinus mucosal thickening.
Unremarkable orbits.

Other: No mastoid effusions.
IMPRESSION: 1. No evidence of acute intracranial abnormality. MRI could provide
more sensitive evaluation for acute infarct if clinically indicated.
2. Remote infarcts and extensive chronic microvascular ischemic
disease, as detailed above.

## 2021-12-03 MED ORDER — ACETAMINOPHEN 325 MG PO TABS
650.0000 mg | ORAL_TABLET | Freq: Four times a day (QID) | ORAL | Status: DC | PRN
  Administered 2021-12-07 – 2021-12-11 (×5): 650 mg via ORAL
  Filled 2021-12-03 (×5): qty 2

## 2021-12-03 MED ORDER — LACTATED RINGERS IV SOLN: INTRAVENOUS | Status: AC

## 2021-12-03 MED ORDER — HEPARIN SODIUM (PORCINE) 5000 UNIT/ML IJ SOLN
5000.0000 [IU] | Freq: Three times a day (TID) | INTRAMUSCULAR | Status: DC
  Administered 2021-12-03 – 2021-12-11 (×24): 5000 [IU] via SUBCUTANEOUS
  Filled 2021-12-03 (×25): qty 1

## 2021-12-03 MED ORDER — LACTATED RINGERS IV BOLUS (SEPSIS)
1000.0000 mL | Freq: Once | INTRAVENOUS | Status: AC
  Administered 2021-12-03: 1000 mL via INTRAVENOUS

## 2021-12-03 MED ORDER — ACETAMINOPHEN 650 MG RE SUPP: 650.0000 mg | Freq: Four times a day (QID) | RECTAL | Status: DC | PRN

## 2021-12-03 NOTE — H&P (Signed)
Date: 12/03/2021               Patient Name:  Holly Bradley MRN: [id number]  DOB: [birth year] Age / Sex: 45 y.o., female   PCP: Mitzi Hansen, MD         Medical Service: Internal Medicine Teaching Service         Attending Physician: Dr. Lucious Groves, DO    First Contact: Dr. Alvie Heidelberg Pager: [phone]  Second Contact: Dr. 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  Patient resides at [facility] facility and staff noted change in mental status prompting ED visit.  Patient is nonverbal at baseline.  However, patient was able to respond to verbal stimuli with making eye contact and respond to simple commands such as squeezing hands.  No new weakness noted.  Patient did appear lethargic through out exam.  Meds:  Current Meds  Medication Sig   acetaminophen (TYLENOL) 500 MG tablet Take 2 tablets (1,000 mg total) by mouth every 6 (six) hours. (Patient taking differently: Take 1,000 mg by mouth every 6 (six) hours. For pain)   amiodarone (PACERONE) 200 MG tablet Take 200 mg by mouth daily.   aspirin 81 MG chewable tablet Chew 1 tablet (81 mg total) by mouth daily.   atorvastatin (LIPITOR) 40 MG tablet Take 1 tablet (40 mg total) by mouth daily. (Patient taking differently: Take 40 mg by mouth at bedtime.)   baclofen (LIORESAL) 20 MG tablet Take 1 tablet (20 mg total) by mouth 3 (three) times daily.   carvedilol (COREG) 25 MG tablet Take 1 tablet (25 mg total) by mouth 2 (two) times daily with a meal.   cloNIDine (CATAPRES) 0.1 MG tablet Take  1 tablet (0.1 mg total) by mouth 3 (three) times daily.   escitalopram (LEXAPRO) 20 MG tablet Take 20 mg by mouth daily.   feeding supplement (ENSURE ENLIVE / ENSURE PLUS) LIQD Take 237 mLs by mouth 3 (three) times daily between meals.   ferrous sulfate 325 (65 FE) MG tablet Take 1 tablet (325 mg total) by mouth 2 (two) times daily with a meal.   hydrALAZINE (APRESOLINE) 100 MG tablet Take 1 tablet (100 mg total) by mouth 3 (three) times daily.   isosorbide mononitrate (IMDUR) 60 MG 24 hr tablet Take 60 mg by mouth daily.   lactulose (CHRONULAC) 10 GM/15ML solution Take 15 mLs (10 g total) by mouth 2 (two) times daily.   linaclotide (LINZESS) 145 MCG CAPS capsule Take 145 mcg by mouth daily before breakfast.   mirtazapine (REMERON) 7.5 MG tablet Take 7.5 mg by mouth at bedtime.   pantoprazole (PROTONIX) 40 MG tablet Take 40 mg by mouth daily.   polyethylene glycol (MIRALAX / GLYCOLAX) 17 g packet Take 17 g by mouth daily.   sacubitril-valsartan (ENTRESTO) 97-103 MG Take 1 tablet by mouth 2 (two) times daily.   senna-docusate (SENOKOT-S) 8.6-50 MG tablet Take 2 tablets by mouth 2 (two) times daily.   simethicone (MYLICON) 40 HY/0.7PX drops Take 0.6 mLs (40 mg total) by mouth every 6 (six)  hours as needed for flatulence.   torsemide (DEMADEX) 20 MG tablet TAKE 2 TABLETS (40 MG TOTAL) BY MOUTH 2 (TWO) TIMES DAILY. (Patient taking differently: Take 40 mg by mouth 2 (two) times daily.)     Allergies: Allergies as of 12/03/2021   (No Known Allergies)   Past Medical History:  Diagnosis Date   CHF (congestive heart failure) (HCC)    Chronic blood loss anemia    Related to uterine fibroids   Essential hypertension    Poorly controlled, repeat by report only on nicardipine 90 mg   Fibroid    Likely complicated by by menometrorrhagia and chronic anemia   Hypertension    Paroxysmal SVT (supraventricular tachycardia) (HCC)    Frequent episodes, can last anywhere from 20 minutes to 12-15 hours.   Not on beta-blocker or calcium channel blocker   Uterine fibroid      Family History  Problem Relation Age of Onset   Dementia Mother      Social History: Patient is a resident of [facility] from nursing facility.    Review of Systems: A complete ROS was negative except as per HPI.   Physical Exam: Blood pressure (!) 163/82, pulse 77, temperature (!) 100.4 F (38 C), temperature source Rectal, resp. rate 11, SpO2 98 %. Physical Exam Constitutional:      General: She is not in acute distress. HENT:     Head: Normocephalic and atraumatic.  Eyes:     General: Lids are normal.  Cardiovascular:     Rate and Rhythm: Normal rate and regular rhythm.     Heart sounds: Normal heart sounds.  Pulmonary:     Effort: Pulmonary effort is normal.     Breath sounds: Normal breath sounds.  Abdominal:     General: Bowel sounds are normal.     Palpations: Abdomen is soft. There is mass.     Tenderness: There is no abdominal tenderness.     Comments: Nodular abdomen  Musculoskeletal:     Cervical back: Neck supple.     Right lower leg: No edema.     Left lower leg: No edema.     Comments: 0/5 strength noted of the left UE 3/5 strength noted of the right UE  Feet:     Right foot:     Toenail Condition: Right toenails are abnormally thick and long.     Left foot:     Toenail Condition: Left toenails are abnormally thick and long.     Comments: Pressure ulcer noted of the left heel Skin:    General: Skin is warm and dry.     Comments: Dry flaky skin noted on exam  Neurological:     Mental Status: She is lethargic.     Cranial Nerves: No facial asymmetry.     Comments: Non verbal on exam; responds to verbal stimuli with eye contact; follow simple command  Psychiatric:        Cognition and Memory: Cognition is impaired.     EKG: personally reviewed my interpretation is Sinus rhythm; Borderline prolonged PR interval old Anterior infarct  DG Abd 1 View  Result Date:  11/22/2021 CLINICAL DATA:  Abdominal pain. EXAM: ABDOMEN - 1 VIEW COMPARISON:  10/10/2021 FINDINGS: No evidence for gaseous small bowel dilatation to suggest obstruction. As before, formed stool is seen in the transverse and left colon. IVC filter is new in the interval. Visualized bony anatomy unremarkable. IMPRESSION: Nonobstructive bowel gas pattern with prominent stool volume in the left colon.  Electronically Signed   By: Misty Stanley M.D.   On: 11/22/2021 07:33   CT Head Wo Contrast  Result Date: 12/03/2021 CLINICAL DATA:  Mental status change, unknown cause EXAM: CT HEAD WITHOUT CONTRAST TECHNIQUE: Contiguous axial images were obtained from the base of the skull through the vertex without intravenous contrast. COMPARISON:  November 21, 2021. FINDINGS: Brain: Encephalomalacia in the right MCA territory, similar. Right brainstem Wallerian degeneration. Embolization coils are again noted in the posterosuperior right temporal lobe. Additional patchy white matter hypoattenuation, nonspecific but compatible with chronic microvascular ischemic disease. Remote infarcts the left basal ganglia and left cerebellum. No evidence of acute hemorrhage, mass lesion, new abnormal mass effect, or extra-axial fluid collection. Ex vacuo ventricular dilation of the right lateral ventricle, similar. No hydrocephalus. Vascular: No hyperdense vessel identified. Skull: No acute fracture. Sinuses/Orbits: Moderate paranasal sinus mucosal thickening. Unremarkable orbits. Other: No mastoid effusions. IMPRESSION: 1. No evidence of acute intracranial abnormality. MRI could provide more sensitive evaluation for acute infarct if clinically indicated. 2. Remote infarcts and extensive chronic microvascular ischemic disease, as detailed above. Electronically Signed   By: Margaretha Sheffield M.D.   On: 12/03/2021 17:26   CT Head Wo Contrast  Result Date: 11/22/2021 CLINICAL DATA:  Speech deficit, stroke suspected. EXAM: CT HEAD WITHOUT  CONTRAST TECHNIQUE: Contiguous axial images were obtained from the base of the skull through the vertex without intravenous contrast. COMPARISON:  Head CT 05/21/2021. FINDINGS: Brain: Prior study demonstrated cytotoxic edema in the right frontotemporal region which now demonstrates broad-based encephalomalacia and volume loss consistent with evolution of a now chronic right MCA infarct. The encephalomalacia merges with additional encephalomalacia in the right parietal lobe. There is ex vacuo expansion of the right lateral ventricle, with no midline shift. Embolization coils are again noted in the posterosuperior right temporal lobe. There is underlying mild-to-moderate cerebral atrophy and moderate to severe small vessel disease of cerebral white matter, chronic lacunar infarcts in the left gangliocapsular area and brainstem, and an old infarct of the superior left cerebellar hemisphere. There is atrophic ventriculomegaly. No new asymmetry is seen concerning for an acute infarct, hemorrhage or mass, but the small vessel disease and old right MCA infarct limit assessment for isolated white matter ischemia. Vascular: There patchy calcifications in the carotid siphons, distal vertebral arteries without hyperdense central vasculature. Skull: Normal. Negative for fracture or focal lesion. Sinuses/Orbits: There is increased opacification of multiple bilateral ethmoid air cells, increased membrane thickening in the right frontal and bilateral maxillary and sphenoid sinus air cells, without intrasinus fluid levels or mastoid effusion. Orbital contents are unremarkable. Other: None. IMPRESSION: 1. No acute intracranial CT findings, but advanced small vessel disease and old right MCA infarct limit assessment for isolated white matter ischemia. Follow-up with MRI if there is concern for occult infarct. 2. Additional chronic infarct in the superior left cerebellum, lacunar infarcts left gangliocapsular area and brainstem as  seen previously. 3. Worsening sinus disease greatest in the ethmoid air cells, without fluid levels but with multifocal ethmoid sinus opacification. Electronically Signed   By: Telford Nab M.D.   On: 11/22/2021 00:09   MR BRAIN WO CONTRAST  Result Date: 11/22/2021 CLINICAL DATA:  Neuro deficit, acute, stroke suspected. Previous stroke with hemiparesis. EXAM: MRI HEAD WITHOUT CONTRAST TECHNIQUE: Multiplanar, multiecho pulse sequences of the brain and surrounding structures were obtained without intravenous contrast. COMPARISON:  Head CT yesterday.  MRI 05/15/2021. FINDINGS: Brain: Diffusion imaging does not show any acute or subacute infarction. There are extensive chronic small-vessel  ischemic changes throughout the pons. There are old cerebellar infarctions, larger on the left than the right. Small focus of hemosiderin deposition on the right. Left cerebral hemisphere shows extensive chronic small-vessel ischemic changes with old lacunar infarctions in the basal ganglia. Right hemisphere shows old infarction in the right middle cerebral artery territory which has progressed to atrophy, encephalomalacia and gliosis. Hemosiderin deposition throughout that region. Chronic small-vessel ischemic change of the white matter otherwise. Mild ex vacuo enlargement of the right lateral ventricle. No suspicion of obstructive hydrocephalus. No extra-axial collection. Vascular: Major vessels at the base of the brain show flow. Skull and upper cervical spine: Negative Sinuses/Orbits: Mucosal inflammatory changes of the paranasal sinuses. Orbits negative. Other: None IMPRESSION: No acute finding by MRI. Old right middle cerebral artery territory stroke with atrophy, encephalomalacia and gliosis. Hemosiderin deposition in that region. Extensive chronic small-vessel ischemic changes elsewhere affecting the brainstem, cerebellum and hemispheric white matter bilaterally. Old lacunar infarctions left basal ganglia. Electronically  Signed   By: Nelson Chimes M.D.   On: 11/22/2021 16:04   DG Chest Port 1 View  Result Date: 12/03/2021 CLINICAL DATA:  Febrile, sepsis EXAM: PORTABLE CHEST 1 VIEW COMPARISON:  10/19/2021 FINDINGS: Single frontal view of the chest demonstrates an unremarkable cardiac silhouette. No airspace disease, effusion, or pneumothorax. No acute bony abnormality. IMPRESSION: 1. No acute intrathoracic process. Electronically Signed   By: Randa Ngo M.D.   On: 12/03/2021 17:17   DG Abd Portable 1 View  Result Date: 12/03/2021 CLINICAL DATA:  Febrile, sepsis, abdominal swelling EXAM: PORTABLE ABDOMEN - 1 VIEW COMPARISON:  11/22/2021 FINDINGS: Supine frontal view of the abdomen and pelvis was performed. Stable IVC filter at the L3 level. No bowel obstruction or ileus. No masses or abnormal calcifications. No acute bony abnormalities. IMPRESSION: 1. Unremarkable bowel gas pattern. Electronically Signed   By: Randa Ngo M.D.   On: 12/03/2021 17:18   EEG adult  Result Date: 11/22/2021 Lora Havens, MD     11/22/2021 12:54 PM [name] MRN: [id number] Epilepsy Attending: Lora Havens Referring Physician/Provider: Dr Idamae Schuller Date: 11/22/2021 Duration: 25.04 mins Patient history: 45 year old female with new onset aphasia and weakness.  EEG to evaluate for seizure. Level of alertness: Awake AEDs during EEG study: None Technical aspects: This EEG study was done with scalp electrodes positioned according to the 10-20 International system of electrode placement. Electrical activity was acquired at a sampling rate of 500Hz  and reviewed with a high frequency filter of 70Hz  and a low frequency filter of 1Hz . EEG data were recorded continuously and digitally stored. Description: The posterior dominant rhythm consists of 9 Hz activity of moderate voltage (25-35 uV) seen predominantly in posterior head regions, symmetric and reactive to eye opening and eye closing.  EEG showed intermittent  generalized and lateralized right hemisphere 3 to 6 Hz theta-delta slowing. Hyperventilation and photic stimulation were not performed.   ABNORMALITY - Intermittent slow, generalized and lateralized right hemisphere IMPRESSION: This study is suggestive of cortical dysfunction in right hemisphere, nonspecific etiology but could be secondary to underlying stroke.  Additionally there is mild diffuse encephalopathy, nonspecific etiology. No seizures or definite epileptiform discharges were seen throughout the recording. [place] by Problem: Principal Problem:   Acute encephalopathy   Acute encephalopathy Patient is nonverbal at baseline. Patient appears lethargic and did not answer any questions during the interview.  However responsive to verbal stimuli with eye contact and follows simple  commands such as squeezing of the hands.  Of note patient has left-sided hemiplegia which was consistent on physical exam.  Patient resides in [facility] facility and staff noted change in mental status.  Labs significant for leukocytosis at 17.6.  Patient is anemic at baseline with hemoglobin of ~8; MCV within normal limits.  No electrolyte abnormalities noted on CMP.  And lactic acid within normal limits.  UA is reassuring. Patient noted to have a fever of low-grade 100.4.  Patient meets SIRS criteria with leukocytosis and fever. No clear source of infection at this time.  Patient received 1 L LR bolus in ED.  Chest x-ray unremarkable.  CT head shows no evidence of acute intracranial abnormalities.  Patient has history of CVA, most recent right MCA stroke in May 2022.  We will need to obtain MRI to rule out any acute abnormalities that could be possibly contributing to recent altered mental state. --Urine cultures and blood cultures pending --MRI head pending --Continue cardiac monitoring --SLP evaluation --Strict I's and O's --Diet n.p.o. at this time; can slowly resume home  medication if SLP passed.  Otherwise consider IV route  Acute Kidney Injury CMP significant for elevated creatinine at 2.5 and BUN 96.  Patient appeared slightly dry on exam.  Patient has received 1 L bolus since admission.  UA was insignificant.  Bulging noted in the suprapubic area, not associated with bladder distention.  Bladder scan negative.  Patient has history of uterine fibroids and findings consistent with exam. --Trend BMP --Continuous IV LR 23mL/h for 12 hours  Hx LLE DVT Patient previously on Eliquis which was Dc'd in the setting of recent uterine bleed (October 2022).  Patient is status post uterine artery embolization.  An IVC filter was placed to prevent PE.  Follow-up for removal in 3 to 6 months. 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  On exam, 0 out of 5 strength of the left side.  3 out of 5 strength of the right upper extremity.  This appears to be patient's baseline since recent stroke.  No new weakness noted.  Patient was nonverbal during exam, however responsive to verbal stimuli with eye contact and follows simple commands.  CT of the head was negative. --Follow-up MRI head  #Hypertension #Hyperlipidemia #HF with recovered EF Recent echo in May 2022 revealed EF of 55-60% with grade II diastolic dysfunction and severe left atrial enlargement.  Vitals are stable; blood pressure acceptable.  Home medications include aspirin 81 mg daily; atorvastatin 40 mg daily; carvedilol 25 mg twice daily; clonidine 0.1 mg 3 times daily; hydralazine 100 mg 3 times daily; torsemide 20 mg twice daily; Imdur 60 mg daily; Entresto twice daily -- Holding home medications in the setting of AMS  Paroxysmal SVT EKG is sinus rhythm with no new changes compared to previous tracings.  Home medication includes amiodarone 200 mg daily. --Holding p.o. meds in the setting of AMS   Dispo:  Admit patient to Observation with expected length of stay less than 2 midnights.  Signed: Timothy Lasso, MD 12/03/2021, 9:57 PM  Pager: [phone] After 5pm on weekdays and 1pm on weekends: On Call pager: [phone]

## 2021-12-03 NOTE — ED Triage Notes (Signed)
Pt BIB [facility] from [facility]. Pt febrile, warm to touch. Hx stroke with reported L sided deficits. Staff reports color change and foul odor to urine. Last seen normal yesterday, unknown time.

## 2021-12-03 NOTE — ED Provider Notes (Addendum)
[facility] EMERGENCY DEPARTMENT Provider Note   CSN: [id number] Arrival date & time: 12/03/21  1544  Level 5 caveat: Altered mental status  History Chief Complaint  Patient presents with   Altered Mental Status    Holly Bradley is a 45 y.o. female.   Altered Mental Status  Patient presents to the ED for evaluation of altered mental status.  Patient is a resident of a nursing facility.  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  According to the EMS report patient was not acting like herself today.  She was also noted to have a fever.  There was mention of odor to the urine.  Patient is nonverbal at baseline and is not able to answer any questions for me.  Past Medical History:  Diagnosis Date   CHF (congestive heart failure) (HCC)    Chronic blood loss anemia    Related to uterine fibroids   Essential hypertension    Poorly controlled, repeat by report only on nicardipine 90 mg   Fibroid    Likely complicated by by menometrorrhagia and chronic anemia   Hypertension    Paroxysmal SVT (supraventricular tachycardia) (HCC)    Frequent episodes, can last anywhere from 20 minutes to 12-15 hours.  Not on beta-blocker or calcium channel blocker   Uterine fibroid     Patient Active Problem List   Diagnosis Date Noted   Weakness 11/22/2021   Pressure injury of skin 10/13/2021   Lupus anticoagulant positive    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)    Acute blood loss anemia 10/10/2021   Hypertension    Non-ischemic cardiomyopathy (HCC)    Swelling of left hand    Proctocolitis    Constipation    Uterine fibroid    Left hemiparesis (HCC)    Hemorrhagic stroke (HCC)    Abnormal uterine bleeding (AUB)    Staphylococcus epidermidis bacteremia    Iron deficiency anemia due to chronic blood loss 05/25/2021   Cerebral edema (HCC)  05/25/2021   Chronic HFrEF (heart failure with reduced ejection fraction) ([place]) 05/25/2021   Pneumonia due to methicillin susceptible Staphylococcus aureus ([place]) 05/25/2021   Acute respiratory failure with hypoxia (HCC)    Acute right MCA stroke ([place]) 05/15/2021   Uncontrolled hypertension 05/15/2021   Cerebrovascular accident (CVA) ([place]) 05/15/2021   IDA (iron deficiency anemia) 03/01/2021   Healthcare maintenance 03/01/2021   Onychomycosis 03/01/2021   Food insecurity 02/02/2021   Psychosis ([place])    Fibroid 01/11/2021   Menorrhagia 01/11/2021   Acute exacerbation of CHF (congestive heart failure) ([place]) 01/10/2021   AKI (acute kidney injury) ([place])    Symptomatic anemia    SVT (supraventricular tachycardia) ([place]) 05/01/2020   CHF (congestive heart failure) ([place]) 05/01/2020    Past Surgical History:  Procedure Laterality Date   IR ANGIOGRAM FOLLOW UP STUDY  05/15/2021   IR AORTAGRAM ABDOMINAL SERIALOGRAM  10/16/2021   IR CT HEAD LTD  05/15/2021   IR EMBO ARTERIAL NOT HEMORR HEMANG INC GUIDE ROADMAPPING  10/16/2021   IR PERCUTANEOUS ART THROMBECTOMY/INFUSION INTRACRANIAL INC DIAG ANGIO  05/15/2021       IR PERCUTANEOUS ART THROMBECTOMY/INFUSION INTRACRANIAL INC DIAG ANGIO  05/15/2021   IR TRANSCATH/EMBOLIZ  05/15/2021   IR US GUIDE VASC ACCESS RIGHT  10/16/2021   IVC FILTER INSERTION N/A 10/24/2021   Procedure: IVC FILTER INSERTION;  Surgeon: Serafina Mitchell, MD;  Location: [facility];  Service: Cardiovascular;  Laterality: N/A;   RADIOLOGY WITH ANESTHESIA N/A 05/15/2021   Procedure: IR WITH ANESTHESIA;  Surgeon: Radiologist, Medication, MD;  Location: [facility];  Service: Radiology;  Laterality: N/A;   Unknown       OB History   No obstetric history on file.     Family History  Problem Relation Age of Onset   Dementia Mother     Social History   Tobacco Use   Smoking status: Every Day   Smokeless tobacco: Never  Vaping Use   Vaping Use: Never used  Substance Use  Topics   Alcohol use: Not Currently    Comment: Occasionally   Drug use: Not Currently    Types: Marijuana    Home Medications Prior to Admission medications   Medication Sig Start Date End Date Taking? Authorizing Provider  acetaminophen (TYLENOL) 500 MG tablet Take 2 tablets (1,000 mg total) by mouth every 6 (six) hours. Patient taking differently: Take 1,000 mg by mouth every 6 (six) hours. For pain 10/27/21  Yes France Ravens, MD  amiodarone (PACERONE) 200 MG tablet Take 200 mg by mouth daily. 02/02/21  Yes [provider]  aspirin 81 MG chewable tablet Chew 1 tablet (81 mg total) by mouth daily. 07/04/21  Yes Samella Parr, NP  atorvastatin (LIPITOR) 40 MG tablet Take 1 tablet (40 mg total) by mouth daily. Patient taking differently: Take 40 mg by mouth at bedtime. 07/04/21  Yes Samella Parr, NP  baclofen (LIORESAL) 20 MG tablet Take 1 tablet (20 mg total) by mouth 3 (three) times daily. 10/27/21  Yes France Ravens, MD  carvedilol (COREG) 25 MG tablet Take 1 tablet (25 mg total) by mouth 2 (two) times daily with a meal. 07/03/21  Yes Samella Parr, NP  cloNIDine (CATAPRES) 0.1 MG tablet Take 1 tablet (0.1 mg total) by mouth 3 (three) times daily. 07/03/21  Yes Samella Parr, NP  escitalopram (LEXAPRO) 20 MG tablet Take 20 mg by mouth daily.   Yes [provider]  feeding supplement (ENSURE ENLIVE / ENSURE PLUS) LIQD Take 237 mLs by mouth 3 (three) times daily between meals. 07/03/21  Yes Samella Parr, NP  ferrous sulfate 325 (65 FE) MG tablet Take 1 tablet (325 mg total) by mouth 2 (two) times daily with a meal. 02/20/21  Yes Lyda Jester M, PA-C  hydrALAZINE (APRESOLINE) 100 MG tablet Take 1 tablet (100 mg total) by mouth 3 (three) times daily. 03/02/21  Yes Bensimhon, Shaune Pascal, MD  isosorbide mononitrate (IMDUR) 60 MG 24 hr tablet Take 60 mg by mouth daily. 02/02/21  Yes [provider]  lactulose (CHRONULAC) 10 GM/15ML solution Take 15 mLs (10 g total)  by mouth 2 (two) times daily. 07/03/21  Yes Samella Parr, NP  linaclotide (LINZESS) 145 MCG CAPS capsule Take 145 mcg by mouth daily before breakfast.   Yes [provider]  mirtazapine (REMERON) 7.5 MG tablet Take 7.5 mg by mouth at bedtime.   Yes [provider]  pantoprazole (PROTONIX) 40 MG tablet Take 40 mg by mouth daily.   Yes [provider]  polyethylene glycol (MIRALAX / GLYCOLAX) 17 g packet Take 17 g by mouth daily. 10/27/21  Yes France Ravens, MD  sacubitril-valsartan (ENTRESTO) 97-103 MG Take 1 tablet by mouth 2 (two) times daily. 02/20/21  Yes Simmons, Brittainy M, PA-C  senna-docusate (SENOKOT-S) 8.6-50 MG tablet Take 2 tablets by mouth 2 (two) times daily.  Yes [provider]  simethicone (MYLICON) 40 JQ/7.3AL drops Take 0.6 mLs (40 mg total) by mouth every 6 (six) hours as needed for flatulence. 07/03/21  Yes Samella Parr, NP  torsemide (DEMADEX) 20 MG tablet TAKE 2 TABLETS (40 MG TOTAL) BY MOUTH 2 (TWO) TIMES DAILY. Patient taking differently: Take 40 mg by mouth 2 (two) times daily. 03/02/21 03/02/22 Yes Bensimhon, Shaune Pascal, MD  Amino Acids-Protein Hydrolys (FEEDING SUPPLEMENT, PRO-STAT SUGAR FREE 64,) LIQD Take 30 mLs by mouth in the morning and at bedtime.    [provider]  bisacodyl (DULCOLAX) 10 MG suppository Place 10 mg rectally See admin instructions. As needed for constipation x 1 dose if no relief from milk of mag    [provider]  Multiple Vitamin (MULTIVITAMIN WITH MINERALS) TABS tablet Take 1 tablet by mouth daily. Patient not taking: Reported on 12/03/2021 07/04/21   Samella Parr, NP  Sodium Phosphates (RA SALINE ENEMA RE) Place 1 enema rectally See admin instructions. As needed for constipation if no relief from bisacodyl suppository Patient not taking: Reported on 12/03/2021    [provider]    Allergies    Patient has no known allergies.  Review of Systems   Review of Systems  Unable to  perform ROS: Acuity of condition   Physical Exam Updated Vital Signs BP (!) 145/85   Pulse 79   Temp (!) 100.4 F (38 C) (Rectal)   Resp (!) 21   SpO2 99%   Physical Exam Vitals and nursing note reviewed.  Constitutional:      Appearance: She is well-developed. She is ill-appearing.     Comments: Not responsive  HENT:     Head: Normocephalic and atraumatic.     Right Ear: External ear normal.     Left Ear: External ear normal.  Eyes:     General: No scleral icterus.       Right eye: No discharge.        Left eye: No discharge.     Conjunctiva/sclera: Conjunctivae normal.  Neck:     Trachea: No tracheal deviation.  Cardiovascular:     Rate and Rhythm: Normal rate and regular rhythm.  Pulmonary:     Effort: Pulmonary effort is normal. No respiratory distress.     Breath sounds: Normal breath sounds. No stridor. No wheezing or rales.  Abdominal:     General: Bowel sounds are normal. There is no distension.     Palpations: Abdomen is soft.     Tenderness: There is no abdominal tenderness. There is no guarding or rebound.  Musculoskeletal:        General: No tenderness or deformity.     Cervical back: Neck supple.  Skin:    General: Skin is warm and dry.     Findings: No rash.  Neurological:     Mental Status: She is unresponsive.     Motor: No seizure activity.     Comments: No facial droop noted however patient unable to look at me or follow commands  Psychiatric:        Mood and Affect: Mood normal.    ED Results / Procedures / Treatments   Labs (all labs ordered are listed, but only abnormal results are displayed) Labs Reviewed  COMPREHENSIVE METABOLIC PANEL - Abnormal; Notable for the following components:      Result Value   BUN 96 (*)    Creatinine, Ser 2.50 (*)    Calcium 11.7 (*)  Total Protein 9.6 (*)    Albumin 3.2 (*)    Alkaline Phosphatase 205 (*)    GFR, Estimated 23 (*)    All other components within normal limits  CBC WITH  DIFFERENTIAL/PLATELET - Abnormal; Notable for the following components:   WBC 17.6 (*)    RBC 3.50 (*)    Hemoglobin 8.9 (*)    HCT 29.1 (*)    MCH 25.4 (*)    RDW 24.7 (*)    Platelets 452 (*)    Neutro Abs 15.7 (*)    Abs Immature Granulocytes 0.14 (*)    All other components within normal limits  URINALYSIS, ROUTINE W REFLEX MICROSCOPIC - Abnormal; Notable for the following components:   APPearance HAZY (*)    Protein, ur 100 (*)    All other components within normal limits  AMMONIA - Abnormal; Notable for the following components:   Ammonia 58 (*)    All other components within normal limits  PROTIME-INR - Abnormal; Notable for the following components:   Prothrombin Time 15.4 (*)    All other components within normal limits  APTT - Abnormal; Notable for the following components:   aPTT 38 (*)    All other components within normal limits  URINALYSIS, MICROSCOPIC (REFLEX) - Abnormal; Notable for the following components:   Bacteria, UA RARE (*)    All other components within normal limits  I-STAT VENOUS BLOOD GAS, ED - Abnormal; Notable for the following components:   pO2, Ven 134.0 (*)    Bicarbonate 28.6 (*)    Acid-Base Excess 3.0 (*)    HCT 31.0 (*)    Hemoglobin 10.5 (*)    All other components within normal limits  RESP PANEL BY RT-PCR (FLU A&B, COVID) ARPGX2  CULTURE, BLOOD (ROUTINE X 2)  CULTURE, BLOOD (ROUTINE X 2)  URINE CULTURE  LACTIC ACID, PLASMA  I-STAT BETA HCG BLOOD, ED ([facility], [facility], [facility] ONLY)    EKG EKG Interpretation  Date/Time:  Monday December 03 2021 16:00:30 EST Ventricular Rate:  82 PR Interval:  202 QRS Duration: 103 QT Interval:  430 QTC Calculation: 503 R Axis:   97 Text Interpretation: Sinus rhythm Borderline prolonged PR interval Anterior infarct, old No significant change since last tracing Confirmed by Dorie Rank [phone]) on 12/03/2021 4:13:18 PM  Radiology CT Head Wo Contrast  Result Date: 12/03/2021 CLINICAL DATA:  Mental status  change, unknown cause EXAM: CT HEAD WITHOUT CONTRAST TECHNIQUE: Contiguous axial images were obtained from the base of the skull through the vertex without intravenous contrast. COMPARISON:  November 21, 2021. FINDINGS: Brain: Encephalomalacia in the right MCA territory, similar. Right brainstem Wallerian degeneration. Embolization coils are again noted in the posterosuperior right temporal lobe. Additional patchy white matter hypoattenuation, nonspecific but compatible with chronic microvascular ischemic disease. Remote infarcts the left basal ganglia and left cerebellum. No evidence of acute hemorrhage, mass lesion, new abnormal mass effect, or extra-axial fluid collection. Ex vacuo ventricular dilation of the right lateral ventricle, similar. No hydrocephalus. Vascular: No hyperdense vessel identified. Skull: No acute fracture. Sinuses/Orbits: Moderate paranasal sinus mucosal thickening. Unremarkable orbits. Other: No mastoid effusions. IMPRESSION: 1. No evidence of acute intracranial abnormality. MRI could provide more sensitive evaluation for acute infarct if clinically indicated. 2. Remote infarcts and extensive chronic microvascular ischemic disease, as detailed above. Electronically Signed   By: Margaretha Sheffield M.D.   On: 12/03/2021 17:26   DG Chest Port 1 View  Result Date: 12/03/2021 CLINICAL DATA:  Febrile, sepsis EXAM: PORTABLE  CHEST 1 VIEW COMPARISON:  10/19/2021 FINDINGS: Single frontal view of the chest demonstrates an unremarkable cardiac silhouette. No airspace disease, effusion, or pneumothorax. No acute bony abnormality. IMPRESSION: 1. No acute intrathoracic process. Electronically Signed   By: Randa Ngo M.D.   On: 12/03/2021 17:17   DG Abd Portable 1 View  Result Date: 12/03/2021 CLINICAL DATA:  Febrile, sepsis, abdominal swelling EXAM: PORTABLE ABDOMEN - 1 VIEW COMPARISON:  11/22/2021 FINDINGS: Supine frontal view of the abdomen and pelvis was performed. Stable IVC filter at the  L3 level. No bowel obstruction or ileus. No masses or abnormal calcifications. No acute bony abnormalities. IMPRESSION: 1. Unremarkable bowel gas pattern. Electronically Signed   By: Randa Ngo M.D.   On: 12/03/2021 17:18    Procedures Procedures   Medications Ordered in ED Medications  lactated ringers bolus 1,000 mL (0 mLs Intravenous Stopped 12/03/21 1759)    ED Course  I have reviewed the triage vital signs and the nursing notes.  Pertinent labs & imaging results that were available during my care of the patient were reviewed by me and considered in my medical decision making (see chart for details).  Clinical Course as of 12/03/21 1939  Mon Dec 03, 2021  1722 White blood cell count elevated 17 [JK]  1722 Hemoglobin stable at 8.9 [JK]  1722 No acute findings noted on abdominal x-ray [JK]  1722 Chest x-ray without acute findings [JK]  1906 CT without acute findings [JK]  1925 Patient appears more alert now.  She is looking at me although not still able to answer questions.  At baseline patient is aphasic. [JK]    Clinical Course User Index [JK] Dorie Rank, MD   MDM Rules/Calculators/A&P                           Patient presented to the ED for evaluation of change in mental status.  Patient does have history of prior stroke and severe deficits at baseline.  She is a resident of a nursing facility and is aphasic.  Patient noted to have a low-grade temperature here.  She does have leukocytosis but no definite signs of source of infection.  Urinalysis does not suggest an infection.  COVID and flu are negative.  Chest x-ray is negative.   Neck is supple, doubt meningitis.  Labs are notable for an acute kidney injury.  Her creatinine is elevated from baseline.  Patient did have a bladder scan and there is no evidence of urinary retention on that.  Patient has palpable uterus on exam but records indicate she does have uterine fibroids.  With her change in mental status will consider  MRI to evaluate further.  Medical service for admission for observation and further evaluation Final Clinical Impression(s) / ED Diagnoses Final diagnoses:  Febrile illness  AKI (acute kidney injury) ([place])        Dorie Rank, MD 12/03/21 1939

## 2021-12-03 NOTE — ED Notes (Signed)
Patient transported to MRI 

## 2021-12-04 ENCOUNTER — Inpatient Hospital Stay (HOSPITAL_COMMUNITY): Payer: Medicaid Other

## 2021-12-04 DIAGNOSIS — N179 Acute kidney failure, unspecified: Secondary | ICD-10-CM | POA: Diagnosis present

## 2021-12-04 DIAGNOSIS — D219 Benign neoplasm of connective and other soft tissue, unspecified: Secondary | ICD-10-CM | POA: Diagnosis not present

## 2021-12-04 DIAGNOSIS — R1310 Dysphagia, unspecified: Secondary | ICD-10-CM | POA: Diagnosis present

## 2021-12-04 DIAGNOSIS — G9340 Encephalopathy, unspecified: Secondary | ICD-10-CM

## 2021-12-04 DIAGNOSIS — E876 Hypokalemia: Secondary | ICD-10-CM | POA: Diagnosis present

## 2021-12-04 DIAGNOSIS — D62 Acute posthemorrhagic anemia: Secondary | ICD-10-CM | POA: Diagnosis present

## 2021-12-04 DIAGNOSIS — E860 Dehydration: Secondary | ICD-10-CM | POA: Diagnosis present

## 2021-12-04 DIAGNOSIS — R4182 Altered mental status, unspecified: Secondary | ICD-10-CM | POA: Diagnosis not present

## 2021-12-04 DIAGNOSIS — D251 Intramural leiomyoma of uterus: Secondary | ICD-10-CM | POA: Diagnosis present

## 2021-12-04 DIAGNOSIS — Z20822 Contact with and (suspected) exposure to covid-19: Secondary | ICD-10-CM | POA: Diagnosis present

## 2021-12-04 DIAGNOSIS — G9341 Metabolic encephalopathy: Secondary | ICD-10-CM | POA: Diagnosis present

## 2021-12-04 DIAGNOSIS — R4701 Aphasia: Secondary | ICD-10-CM | POA: Diagnosis not present

## 2021-12-04 DIAGNOSIS — R569 Unspecified convulsions: Secondary | ICD-10-CM | POA: Diagnosis present

## 2021-12-04 DIAGNOSIS — I69391 Dysphagia following cerebral infarction: Secondary | ICD-10-CM | POA: Diagnosis not present

## 2021-12-04 DIAGNOSIS — F32A Depression, unspecified: Secondary | ICD-10-CM | POA: Diagnosis present

## 2021-12-04 DIAGNOSIS — I5032 Chronic diastolic (congestive) heart failure: Secondary | ICD-10-CM | POA: Diagnosis present

## 2021-12-04 DIAGNOSIS — R509 Fever, unspecified: Secondary | ICD-10-CM | POA: Diagnosis not present

## 2021-12-04 DIAGNOSIS — Z66 Do not resuscitate: Secondary | ICD-10-CM | POA: Diagnosis present

## 2021-12-04 DIAGNOSIS — I700 Atherosclerosis of aorta: Secondary | ICD-10-CM | POA: Diagnosis present

## 2021-12-04 DIAGNOSIS — Z8673 Personal history of transient ischemic attack (TIA), and cerebral infarction without residual deficits: Secondary | ICD-10-CM | POA: Diagnosis not present

## 2021-12-04 DIAGNOSIS — I110 Hypertensive heart disease with heart failure: Secondary | ICD-10-CM | POA: Diagnosis present

## 2021-12-04 DIAGNOSIS — N898 Other specified noninflammatory disorders of vagina: Secondary | ICD-10-CM | POA: Diagnosis present

## 2021-12-04 DIAGNOSIS — E785 Hyperlipidemia, unspecified: Secondary | ICD-10-CM | POA: Diagnosis present

## 2021-12-04 DIAGNOSIS — N920 Excessive and frequent menstruation with regular cycle: Secondary | ICD-10-CM | POA: Diagnosis present

## 2021-12-04 DIAGNOSIS — I471 Supraventricular tachycardia: Secondary | ICD-10-CM | POA: Diagnosis present

## 2021-12-04 DIAGNOSIS — I69320 Aphasia following cerebral infarction: Secondary | ICD-10-CM | POA: Diagnosis not present

## 2021-12-04 DIAGNOSIS — I69354 Hemiplegia and hemiparesis following cerebral infarction affecting left non-dominant side: Secondary | ICD-10-CM | POA: Diagnosis not present

## 2021-12-04 DIAGNOSIS — D649 Anemia, unspecified: Secondary | ICD-10-CM | POA: Diagnosis not present

## 2021-12-04 DIAGNOSIS — Z515 Encounter for palliative care: Secondary | ICD-10-CM | POA: Diagnosis not present

## 2021-12-04 DIAGNOSIS — N719 Inflammatory disease of uterus, unspecified: Secondary | ICD-10-CM | POA: Diagnosis present

## 2021-12-04 LAB — CBC WITH DIFFERENTIAL/PLATELET
Abs Immature Granulocytes: 0.18 10*3/uL — ABNORMAL HIGH (ref 0.00–0.07)
Basophils Absolute: 0 10*3/uL (ref 0.0–0.1)
Basophils Relative: 0 %
Eosinophils Absolute: 0.1 10*3/uL (ref 0.0–0.5)
Eosinophils Relative: 0 %
HCT: 27.2 % — ABNORMAL LOW (ref 36.0–46.0)
Hemoglobin: 8.3 g/dL — ABNORMAL LOW (ref 12.0–15.0)
Immature Granulocytes: 1 %
Lymphocytes Relative: 4 %
Lymphs Abs: 0.8 10*3/uL (ref 0.7–4.0)
MCH: 25.1 pg — ABNORMAL LOW (ref 26.0–34.0)
MCHC: 30.5 g/dL (ref 30.0–36.0)
MCV: 82.2 fL (ref 80.0–100.0)
Monocytes Absolute: 0.9 10*3/uL (ref 0.1–1.0)
Monocytes Relative: 5 %
Neutro Abs: 16.8 10*3/uL — ABNORMAL HIGH (ref 1.7–7.7)
Neutrophils Relative %: 90 %
Platelets: 488 10*3/uL — ABNORMAL HIGH (ref 150–400)
RBC: 3.31 MIL/uL — ABNORMAL LOW (ref 3.87–5.11)
RDW: 24.1 % — ABNORMAL HIGH (ref 11.5–15.5)
WBC: 18.7 10*3/uL — ABNORMAL HIGH (ref 4.0–10.5)
nRBC: 0 % (ref 0.0–0.2)

## 2021-12-04 LAB — COMPREHENSIVE METABOLIC PANEL
ALT: 12 U/L (ref 0–44)
AST: 17 U/L (ref 15–41)
Albumin: 2.7 g/dL — ABNORMAL LOW (ref 3.5–5.0)
Alkaline Phosphatase: 187 U/L — ABNORMAL HIGH (ref 38–126)
Anion gap: 14 (ref 5–15)
BUN: 90 mg/dL — ABNORMAL HIGH (ref 6–20)
CO2: 23 mmol/L (ref 22–32)
Calcium: 11.2 mg/dL — ABNORMAL HIGH (ref 8.9–10.3)
Chloride: 103 mmol/L (ref 98–111)
Creatinine, Ser: 2.04 mg/dL — ABNORMAL HIGH (ref 0.44–1.00)
GFR, Estimated: 30 mL/min — ABNORMAL LOW (ref >60)
Glucose, Bld: 105 mg/dL — ABNORMAL HIGH (ref 70–99)
Potassium: 3.2 mmol/L — ABNORMAL LOW (ref 3.5–5.1)
Sodium: 140 mmol/L (ref 135–145)
Total Bilirubin: 1.1 mg/dL (ref 0.3–1.2)
Total Protein: 8.6 g/dL — ABNORMAL HIGH (ref 6.5–8.1)

## 2021-12-04 LAB — URINE CULTURE: Culture: NO GROWTH

## 2021-12-04 MED ORDER — HYDRALAZINE HCL 20 MG/ML IJ SOLN: 5.0000 mg | INTRAMUSCULAR | Status: DC | PRN

## 2021-12-04 MED ORDER — PANTOPRAZOLE SODIUM 40 MG PO TBEC
40.0000 mg | DELAYED_RELEASE_TABLET | Freq: Every day | ORAL | Status: DC
  Administered 2021-12-04 – 2021-12-12 (×8): 40 mg via ORAL
  Filled 2021-12-04 (×9): qty 1

## 2021-12-04 MED ORDER — HYDRALAZINE HCL 50 MG PO TABS
100.0000 mg | ORAL_TABLET | Freq: Three times a day (TID) | ORAL | Status: DC
  Administered 2021-12-04 – 2021-12-12 (×20): 100 mg via ORAL
  Filled 2021-12-04 (×23): qty 2

## 2021-12-04 MED ORDER — ISOSORBIDE MONONITRATE ER 60 MG PO TB24
60.0000 mg | ORAL_TABLET | Freq: Every day | ORAL | Status: DC
  Administered 2021-12-04 – 2021-12-12 (×8): 60 mg via ORAL
  Filled 2021-12-04 (×2): qty 1
  Filled 2021-12-04: qty 2
  Filled 2021-12-04 (×7): qty 1

## 2021-12-04 MED ORDER — CARVEDILOL 25 MG PO TABS
25.0000 mg | ORAL_TABLET | Freq: Two times a day (BID) | ORAL | Status: DC
  Administered 2021-12-05 – 2021-12-12 (×14): 25 mg via ORAL
  Filled 2021-12-04 (×15): qty 1

## 2021-12-04 MED ORDER — AMIODARONE HCL 200 MG PO TABS
200.0000 mg | ORAL_TABLET | Freq: Every day | ORAL | Status: DC
  Administered 2021-12-04: 200 mg via ORAL
  Filled 2021-12-04 (×2): qty 1

## 2021-12-04 MED ORDER — CLONIDINE HCL 0.1 MG PO TABS
0.1000 mg | ORAL_TABLET | Freq: Three times a day (TID) | ORAL | Status: DC
  Administered 2021-12-04 – 2021-12-12 (×22): 0.1 mg via ORAL
  Filled 2021-12-04 (×24): qty 1

## 2021-12-04 MED ORDER — ESCITALOPRAM OXALATE 20 MG PO TABS
20.0000 mg | ORAL_TABLET | Freq: Every day | ORAL | Status: DC
  Administered 2021-12-04: 20 mg via ORAL
  Filled 2021-12-04: qty 1
  Filled 2021-12-04: qty 2

## 2021-12-04 MED ORDER — HYDRALAZINE HCL 20 MG/ML IJ SOLN
10.0000 mg | Freq: Four times a day (QID) | INTRAMUSCULAR | Status: DC | PRN
  Administered 2021-12-04 (×2): 10 mg via INTRAVENOUS
  Filled 2021-12-04 (×2): qty 1

## 2021-12-04 MED ORDER — ASPIRIN 81 MG PO CHEW
81.0000 mg | CHEWABLE_TABLET | Freq: Every day | ORAL | Status: DC
  Administered 2021-12-04 – 2021-12-12 (×8): 81 mg via ORAL
  Filled 2021-12-04 (×9): qty 1

## 2021-12-04 MED ORDER — LACTATED RINGERS IV BOLUS
500.0000 mL | Freq: Once | INTRAVENOUS | Status: AC
Start: 2021-12-04 — End: 2021-12-04
  Administered 2021-12-04: 500 mL via INTRAVENOUS

## 2021-12-04 MED ORDER — SENNOSIDES-DOCUSATE SODIUM 8.6-50 MG PO TABS
2.0000 | ORAL_TABLET | Freq: Two times a day (BID) | ORAL | Status: DC
  Administered 2021-12-04 – 2021-12-12 (×13): 2 via ORAL
  Filled 2021-12-04 (×16): qty 2

## 2021-12-04 MED ORDER — POTASSIUM CHLORIDE 10 MEQ/100ML IV SOLN
10.0000 meq | INTRAVENOUS | Status: AC
  Administered 2021-12-04 (×3): 10 meq via INTRAVENOUS
  Filled 2021-12-04 (×3): qty 100

## 2021-12-04 MED ORDER — ATORVASTATIN CALCIUM 40 MG PO TABS
40.0000 mg | ORAL_TABLET | Freq: Every day | ORAL | Status: DC
  Administered 2021-12-04 – 2021-12-12 (×8): 40 mg via ORAL
  Filled 2021-12-04 (×9): qty 1

## 2021-12-04 NOTE — Evaluation (Signed)
Clinical/Bedside Swallow Evaluation Patient Details  Name: Holly Bradley MRN: [id number] Date of Birth: [birth year]  Today's Date: 12/04/2021 Time: SLP Start Time (ACUTE ONLY): 0910 SLP Stop Time (ACUTE ONLY): 0945 SLP Time Calculation (min) (ACUTE ONLY): 35 min  Past Medical History:  Past Medical History:  Diagnosis Date   CHF (congestive heart failure) (HCC)    Chronic blood loss anemia    Related to uterine fibroids   Essential hypertension    Poorly controlled, repeat by report only on nicardipine 90 mg   Fibroid    Likely complicated by by menometrorrhagia and chronic anemia   Hypertension    Paroxysmal SVT (supraventricular tachycardia) (HCC)    Frequent episodes, can last anywhere from 20 minutes to 12-15 hours.  Not on beta-blocker or calcium channel blocker   Uterine fibroid    Past Surgical History:  Past Surgical History:  Procedure Laterality Date   IR ANGIOGRAM FOLLOW UP STUDY  05/15/2021   IR AORTAGRAM ABDOMINAL SERIALOGRAM  10/16/2021   IR CT HEAD LTD  05/15/2021   IR EMBO ARTERIAL NOT HEMORR HEMANG INC GUIDE ROADMAPPING  10/16/2021   IR PERCUTANEOUS ART THROMBECTOMY/INFUSION INTRACRANIAL INC DIAG ANGIO  05/15/2021       IR PERCUTANEOUS ART THROMBECTOMY/INFUSION INTRACRANIAL INC DIAG ANGIO  05/15/2021   IR TRANSCATH/EMBOLIZ  05/15/2021   IR US GUIDE VASC ACCESS RIGHT  10/16/2021   IVC FILTER INSERTION N/A 10/24/2021   Procedure: IVC FILTER INSERTION;  Surgeon: Serafina Mitchell, MD;  Location: [facility];  Service: Cardiovascular;  Laterality: N/A;   RADIOLOGY WITH ANESTHESIA N/A 05/15/2021   Procedure: IR WITH ANESTHESIA;  Surgeon: Radiologist, Medication, MD;  Location: [facility];  Service: Radiology;  Laterality: N/A;   Unknown     HPI:  45yo female admitted 12/03/21 with AMS. PMH: adm 11/30-12/3/22 with aphasia, dysarthria, right sided weakness, wheel chair or bedbound at baseline, DVT, HTN, HFrEF, paroxysmal SVT, uterine fibroids, R MCA CVA (04/2021) with  thrombectomy, multiple intubations, trach and deconnulation. MRI = no acute intracranial process.    Assessment / Plan / Recommendation  Clinical Impression  Pt presents alert and cooperative. She is nonvocal today, and was unable to consistently following directions throughout this evaluation. Pt with adequate natural dentition. She was unable to follow commands for CN evaluation, however, right anterior leakage was noted after ice chips. Pt accepted trials of ice chips, thin liquid, nectar thick liquid, puree, and solid textures. Oral holding was noted across consistencies, with suspected delay of swallow reflex trigger. Extended oral prep noted with puree and solids, with oral residue noted after both puree and solid trials. Immediate cough response noted following trial of thin liquid, but no cough noted after any other trial.   Given current presentation in addition to her history, will begin puree (dys1) diet with nectar thick liquids. Recommend crushing meds in puree. Oral suction at bedside would be beneficial for completion of thorough oral care and to assist with clearing of oral pocketing/residue. She was unable to open her mouth for visualization of clearing. Safe swallow precautions posted at [facility]. SLP will follow for assessment of diet tolerance, readiness to advance, and need for instrumental study. RN and MD informed.  SLP Visit Diagnosis: Dysphagia, unspecified (R13.10)    Aspiration Risk  Moderate aspiration risk    Diet Recommendation Dysphagia 1 (Puree);Nectar-thick liquid   Liquid Administration via: Straw;Cup Medication Administration: Crushed with puree Supervision: Staff to assist with self feeding;Full supervision/cueing for compensatory strategies Compensations: Minimize environmental distractions;Slow  rate;Small sips/bites;Follow solids with liquid Postural Changes: Seated upright at 90 degrees;Remain upright for at least 30 minutes after po intake    Other   Recommendations Oral Care Recommendations: Oral care BID;Staff/trained caregiver to provide oral care Other Recommendations: Order thickener from pharmacy;Have oral suction available    Recommendations for follow up therapy are one component of a multi-disciplinary discharge planning process, led by the attending physician.  Recommendations may be updated based on patient status, additional functional criteria and insurance authorization.  Follow up Recommendations Skilled nursing-short term rehab (<3 hours/day)      Assistance Recommended at Discharge Frequent or constant Supervision/Assistance  Functional Status Assessment Patient has not had a recent decline in their functional status  Frequency and Duration min 2x/week  1 week;2 weeks       Prognosis Prognosis for Safe Diet Advancement: Fair Barriers to Reach Goals: Cognitive deficits      Swallow Study   General Date of Onset: 12/03/21 HPI: 45yo female admitted 12/03/21 with AMS. PMH: adm 11/30-12/3/22 with aphasia, dysarthria, right sided weakness, wheel chair or bedbound at baseline, DVT, HTN, HFrEF, paroxysmal SVT, uterine fibroids, R MCA CVA (04/2021) with thrombectomy, multiple intubations, trach and deconnulation. MRI = no acute intracranial process. Type of Study: Bedside Swallow Evaluation Previous Swallow Assessment: MBS 06/21/21 - occasional trace aspiration of thin with strong cough response. BSE 11/22/21 - D1/NTL Diet Prior to this Study: NPO Temperature Spikes Noted: No Respiratory Status: Room air History of Recent Intubation: No Behavior/Cognition: Alert;Cooperative;Doesn't follow directions;Requires cueing Oral Cavity Assessment: Dried secretions Oral Care Completed by SLP: No Oral Cavity - Dentition: Adequate natural dentition Self-Feeding Abilities: Total assist Patient Positioning: Upright in bed Baseline Vocal Quality: Aphonic Volitional Cough: Cognitively unable to elicit Volitional Swallow: Unable to  elicit    Oral/Motor/Sensory Function Overall Oral Motor/Sensory Function: Mild impairment (pt unable to participate in CN exam. Right anterior leakage noted during BSE)   Ice Chips Ice chips: Impaired Oral Phase Impairments: Poor awareness of bolus Oral Phase Functional Implications: Oral holding;Right anterior spillage Pharyngeal Phase Impairments: Suspected delayed Swallow   Thin Liquid Thin Liquid: Impaired Presentation: Straw Pharyngeal  Phase Impairments: Cough - Immediate    Nectar Thick Nectar Thick Liquid: Within functional limits Presentation: Straw   Honey Thick Honey Thick Liquid: Not tested   Puree Puree: Impaired Presentation: Spoon Oral Phase Functional Implications: Prolonged oral transit;Oral holding Pharyngeal Phase Impairments: Suspected delayed Swallow   Solid     Solid: Impaired Oral Phase Impairments: Impaired mastication Oral Phase Functional Implications: Oral residue;Prolonged oral transit;Impaired mastication;Oral holding Pharyngeal Phase Impairments: Suspected delayed Swallow     Namir Neto B. Quentin Ore, [facility], [place] Speech Language Pathologist Office: [phone]  Shonna Chock 12/04/2021,9:50 AM

## 2021-12-04 NOTE — ED Notes (Signed)
Pts brief changed at this time.

## 2021-12-04 NOTE — ED Notes (Signed)
Paged MD Rehman about SBP 170-190s.

## 2021-12-04 NOTE — ED Notes (Signed)
Attempted to give pt PO meds with apple sauce. Pt pocketed all apple sauce and medication into cheek and this RN has to manually remove from pts mouth. MD aware. This RN holding food and medication at this time.

## 2021-12-04 NOTE — ED Notes (Signed)
Pt ate approximately 30% on food and drank 4 oz nectar thick tea

## 2021-12-04 NOTE — Progress Notes (Signed)
EEG complete - results pending 

## 2021-12-04 NOTE — Evaluation (Signed)
Occupational Therapy Evaluation Patient Details Name: Holly Bradley MRN: [id number] DOB: 1976-03-23 Today's Date: 12/04/2021   History of Present Illness 45 yo admitted from SNF for AMS. 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 PMH: Pressure injury of skin, Lupus, DVT, HTN, Hemorrhagic CVA. PNU, CHF, phsychosis, AKI, SVT.   Clinical Impression   Pt admitted from SNF. Total A with ADL, including self feeding and Total A +2 with bed mobility. Pt with B ankle contractures in plantarflexion, L hand contracture and multiple areas of skin breakdown on L heel and sacrum. Recommend Palliative Care consult, B Prevalon boots and moving pt to a Air Mattress once on the floor due to high risk for further skin breakdown. All further OT to be addressed at SNF. Acute OT signing off.      Recommendations for follow up therapy are one component of a multi-disciplinary discharge planning process, led by the attending physician.  Recommendations may be updated based on patient status, additional functional criteria and insurance authorization.   Follow Up Recommendations  Skilled nursing-short term rehab (<3 hours/day)    Assistance Recommended at Discharge Frequent or constant Supervision/Assistance  Functional Status Assessment  Patient has not had a recent decline in their functional status  Equipment Recommendations  None recommended by OT    Recommendations for Other Services Other (comment) (Palliative Care)     Precautions / Restrictions Precautions Precautions: Fall Precaution Comments: risk for skin breakdown      Mobility Bed Mobility               General bed mobility comments: total A +2    Transfers                   General transfer comment: will need Maximove      Balance                                           ADL either performed or  assessed with clinical judgement   ADL                                         General ADL Comments: totoal A for all ADL; incontinenet     Vision Patient Visual Report:  (Most lkely L field cut; impaired)       Perception     Praxis      Pertinent Vitals/Pain Pain Assessment: Faces Faces Pain Scale: Hurts even more Pain Location: groaning with repositioning Pain Descriptors / Indicators: Grimacing;Guarding;Moaning Pain Intervention(s): Limited activity within patient's tolerance     Hand Dominance Right   Extremity/Trunk Assessment Upper Extremity Assessment Upper Extremity Assessment: RUE deficits/detail RUE Deficits / Details: RUE @ 3/5 throughtout; limited shoulder FF/abd to @ 60 AROM LUE Deficits / Details: Pt with chronic Lt hemiplegia.  No volitional movement noted.  achieved ~80% finger flexion, wrist to @ 20 flex from neutral, elbow WFL, shoulder ~75* passively; contracture   Lower Extremity Assessment Lower Extremity Assessment:  (B contractures in plantarflexion; wound L heel with Mepalex)   Cervical / Trunk Assessment Cervical / Trunk Assessment: Other exceptions (thin; bony prominences; skin breakdown L sacrak area; Mepalex applied)   Communication Communication Communication: Expressive difficulties  Cognition Arousal/Alertness: Awake/alert Behavior During Therapy: Flat affect Overall Cognitive Status: Difficult to assess                                 General Comments: following 1 step commands inconsistently with increased processing time     General Comments       Exercises     Shoulder Instructions      Home Living Family/patient expects to be discharged to:: Skilled nursing facility                                        Prior Functioning/Environment Prior Level of Function : Needs assist                        OT Problem List: Decreased strength;Decreased range of  motion;Decreased activity tolerance;Impaired balance (sitting and/or standing);Impaired vision/perception;Decreased coordination;Decreased cognition;Decreased safety awareness;Decreased knowledge of use of DME or AE;Impaired sensation;Impaired tone;Impaired UE functional use;Pain      OT Treatment/Interventions:      OT Goals(Current goals can be found in the care plan section)    OT Frequency:     Barriers to D/C:            Co-evaluation              AM-PAC OT "6 Clicks" Daily Activity     Outcome Measure Help from another person eating meals?: Total Help from another person taking care of personal grooming?: Total Help from another person toileting, which includes using toliet, bedpan, or urinal?: Total Help from another person bathing (including washing, rinsing, drying)?: Total Help from another person to put on and taking off regular upper body clothing?: Total Help from another person to put on and taking off regular lower body clothing?: Total 6 Click Score: 6   End of Session Nurse Communication: Mobility status;Other (comment) (positioning; Needs B Prevalon Boots - NS ordered)  Activity Tolerance: Other (comment) (lmited by apparent discomfort with movement) Patient left: in bed;with call bell/phone within reach  OT Visit Diagnosis: Hemiplegia and hemiparesis;Cognitive communication deficit (R41.841);Muscle weakness (generalized) (M62.81);Pain Symptoms and signs involving cognitive functions: Cerebral infarction (May 2022) Hemiplegia - Right/Left: Left Hemiplegia - dominant/non-dominant: Non-Dominant Hemiplegia - caused by: Cerebral infarction Pain - part of body:  (generalized)                Time: 2263-3354 OT Time Calculation (min): 15 min Charges:  OT General Charges $OT Visit: 1 Visit OT Evaluation $OT Eval Moderate Complexity: [place], OT/L   Acute OT Clinical Specialist Acute Rehabilitation Services Pager [phone] Office  [phone]   [place] [facility] 12/04/2021, 11:18 AM

## 2021-12-04 NOTE — Progress Notes (Addendum)
Subjective: Patient was seen at bedside during rounds this morning. She is non-verbal and very minimally responds to verbal stimuli. Able to nod yes when asked if she could hear but did not respond when asked about SOB, cough or pain. No other complains or concerns at this time.   Objective:  Vital signs in last 24 hours: Vitals:   12/04/21 0845 12/04/21 0900 12/04/21 1200 12/04/21 1300  BP:  (!) 189/99 (!) 186/82 (!) 182/83  Pulse:  71 69 83  Resp:  (!) 29 17 (!) 31  Temp:      TempSrc:      SpO2: 94% 94% 98% 100%   General Physical Exam: Constitutional: alert, well-appearing, in no acute distress. Non-verbal HENT: normocephalic, atraumatic, dry mucous membranes Eyes: conjunctiva non-erythematous, extraocular movements intact Neck: supple Cardiovascular: regular rate and rhythm, no m/r/g Pulmonary/Chest: normal work of breathing on room air, unable to auscultate as pt was not following commands.  Abdominal: soft, non-tender to palpation, non-distended MSK: normal bulk and tone Neurological: alert, unable to evaluate orientation. Skin: warm and dry Psych: normal behavior, blunted affect   Assessment/Plan:  Principal Problem:   Acute encephalopathy Active Problems:   AKI (acute kidney injury) ([place])   Hypertension   Pressure injury of skin   Hypercalcemia  Pt is a 45 year old female with pertinent history of CVA in 2022 residual left sided hemiplegia, history of DVT, hypertension, heart failure with recovered EF, paroxysmal SVT, uterine fibroids status post uterine embolization admitted for acute encephalopathy likely 2/2 dehydration vs hypercalcemia vs possible seizures.    Acute encephalopathy likely secondary to dehydration vs hypercalcemia vs possible seizures -Pt is nonverbal at baseline and a left hemiplegia and dysarthria s/p CVA in May 2022. Pt does have leukocytosis with WBC count of 18.7 and is tachypniec with RR in 30s, meeting SIRs criteria. Initial workup  shows no evidence of infections or acute stroke leading to acute encephalopathy as evidenced by an unremarkable UA, CXR, head CT and head MRI. Pt does not meet sepsis criteria due to lack of source of infection. Pt does have an electrolyte derangement; a moderate hypercalcemia with corrected Ca levels of 12.2 which can lead to altered mental status. She also shows signs of dehydration on exam such as dry oral mucosa and has an acute kidney injury with a serum creatinine of 2.04 and a baseline SCr of 0.7. This AKI is most likely due to dehydration. Furthermore, literature indicates that dehydration can lead to altered mental status in older patients. Plan is to hydrate patient and monitor for change in mental status. Studies also show that strokes are the most common cause of seizures in adults >80 yo which could also be a cause of the pt's symptoms at this time.  -LR 500 cc bolus to improve hydration status and hypercalcemia. Will monitor pt as she does have a history of diastolic heart failure.  -F/u EEG results to rule out seizures 2/2 prior stroke -Recheck CBC and BMP -SLP recommends dysphagia 1 nectar-thick liquid diet. Pt has moderate aspiration risk and recommends oral suction at bedside. -Pt/OT following, appreciate recs   Acute kidney injury likely 2/2 dehydration Pt noted to have a serum creatinine of 2.04 today with a baseline of 0.7. On physical exam, pt does have dry oral mucosal membranes. -LR 500 cc bolus -Recheck BMP in the AM  Mild Hypokalemia Pt noted to have a serum potassium level of 3.2 today.  -Replete PRN -Recheck levels on BMP  Hx LLE DVT Patient previously on Eliquis which was discontinued in the setting of recent uterine bleed (October 2022).  Patient is status post uterine artery embolization.  An IVC filter was placed to prevent PE.  Follow-up for removal in 3 to 6 months. -Started on heparin prophylaxis   Aphasia/left-sided weakness Severe right MCA stroke in May  2022 resulting in left-sided hemiplegia and dysarthria.  On exam, 0 out of 5 strength of the left side. 3 out of 5 strength of the right upper extremity.  This appears to be patient's baseline since recent stroke.  No new weakness noted.  Patient was nonverbal during exam, however responsive to verbal stimuli with eye contact and follows simple commands.   -Head MRI and CT are negative.   Chronic Problems:  Hypertension Pt's SBPs are elevated at 170-190s today.  -Will restart Clonidine 0.1  TID, 5 mg IV hydralazine Q4H for SBP >180 and Carvedilol 25 mg BID     Hyperlipidemia Pt has a history HLD and takes atorvastatin 40 mg QD. -Will restart Atorvastatin 40 mg QD  -Most recent Lipid Panel results    Component Value Date/Time   CHOL 131 05/16/2021 0457   TRIG 81 05/24/2021 0447   HDL 19 (L) 05/16/2021 0457   CHOLHDL 6.9 05/16/2021 0457   VLDL 36 05/16/2021 0457   LDLCALC 76 05/16/2021 0457     HF with recovered EF Recent echo in May 2022 revealed EF of 55-60% with grade II diastolic dysfunction and severe left atrial enlargement.  Vitals are stable; blood pressure acceptable. -Holding torsemide 20 mg twice daily until hypercalcemia resolves and will consider restarting if pt's symptoms resolve.  -restarting Imdur 60 mg QD, ASA 81 mg QD and Carvedilol 25 mg BID    Paroxysmal SVT EKG is sinus rhythm with no new changes compared to previous tracings.  Home medication includes amiodarone 200 mg daily. -Start amiodarone 200 mg QD  Continue following home medications: Lexapro  20 mg Protonix 40 mg QD   Best Practice: Diet:  dysphagia 1 IVF: LR, 500 cc bolus VTE: Heparin Code: Full  Signature: Claudean Kinds, [facility]  Pager: [phone] 1:24 PM, 12/04/2021

## 2021-12-04 NOTE — ED Notes (Signed)
Oral care performed after pt completed eating

## 2021-12-04 NOTE — Progress Notes (Signed)
Evaluated at the bedside to assess volume status per day team's request.  Patient sleeping comfortably patient appears euvolemic.  No skin turgor and moist mucous membranes appreciated.    Vitals:   12/04/21 1800 12/04/21 2003  BP: (!) 144/78 (!) 143/83  Pulse: 84 87  Resp: 14 14  Temp: 98.8 F (37.1 C) 99.3 F (37.4 C)  SpO2: 100% 99%   No new orders at this time.  Harlow Ohms, DO PGY-3 IMTS

## 2021-12-04 NOTE — ED Notes (Signed)
Pt changed, new purewick in place, and peri care provided.

## 2021-12-04 NOTE — Evaluation (Signed)
Physical Therapy Evaluation and Discharge Patient Details Name: Holly Bradley MRN: [id number] DOB: 03-12-1976 Today's Date: 12/04/2021  History of Present Illness  45 yo admitted from SNF for AMS. 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 PMH: Pressure injury of skin, Lupus, DVT, HTN, Hemorrhagic CVA. PNU, CHF, phsychosis, AKI, SVT.  Clinical Impression  Pt admitted secondary to problem above with deficits below. Pt with expressive difficulty and R hemiplegia at baseline. Pt requiring total A +2 for mobility tasks. Anticipate pt is close to her baseline. Recommend Palliative Care consult, B Prevalon boots and moving pt to a Air Mattress once on the floor due to high risk for further skin breakdown. All further needs can be deferred to SNF. Will sign off. If needs change, please re-consult.       Recommendations for follow up therapy are one component of a multi-disciplinary discharge planning process, led by the attending physician.  Recommendations may be updated based on patient status, additional functional criteria and insurance authorization.  Follow Up Recommendations Long-term institutional care without follow-up therapy    Assistance Recommended at Discharge Frequent or constant Supervision/Assistance  Functional Status Assessment Patient has had a recent decline in their functional status and demonstrates the ability to make significant improvements in function in a reasonable and predictable amount of time.  Equipment Recommendations  None recommended by PT    Recommendations for Other Services       Precautions / Restrictions Precautions Precautions: Fall Precaution Comments: risk for skin breakdown Restrictions Weight Bearing Restrictions: No      Mobility  Bed Mobility Overal bed mobility: Needs Assistance Bed Mobility: Rolling Rolling: Total assist;+2 for physical  assistance         General bed mobility comments: total A +2 for clean up    Transfers                   General transfer comment: will need Maximove    Ambulation/Gait                  Stairs            Wheelchair Mobility    Modified Rankin (Stroke Patients Only)       Balance                                             Pertinent Vitals/Pain Pain Assessment: Faces Faces Pain Scale: Hurts even more Pain Location: groaning with repositioning Pain Descriptors / Indicators: Grimacing;Guarding;Moaning Pain Intervention(s): Monitored during session;Limited activity within patient's tolerance;Repositioned    Home Living Family/patient expects to be discharged to:: Skilled nursing facility                        Prior Function Prior Level of Function : Needs assist             Mobility Comments: Per previous notes, pt dependent for transfers       Hand Dominance   Dominant Hand: Right    Extremity/Trunk Assessment   Upper Extremity Assessment Upper Extremity Assessment: Defer to OT evaluation RUE Deficits / Details: RUE @ 3/5 throughtout; limited shoulder FF/abd to @ 60 AROM LUE Deficits / Details: Pt with chronic Lt hemiplegia.  No volitional movement noted.  achieved ~80% finger  flexion, wrist to @ 20 flex from neutral, elbow WFL, shoulder ~75* passively; contracture    Lower Extremity Assessment Lower Extremity Assessment: LLE deficits/detail;Generalized weakness;RLE deficits/detail RLE Deficits / Details: PF contracture LLE Deficits / Details: L hemiparesis. PF contracture with wound noted on heel.    Cervical / Trunk Assessment Cervical / Trunk Assessment: Other exceptions (thin; bony prominences; skin breakdown L sacrak area; Mepalex applied)  Communication   Communication: Expressive difficulties  Cognition Arousal/Alertness: Awake/alert Behavior During Therapy: Flat affect Overall Cognitive  Status: Difficult to assess                                 General Comments: following 1 step commands inconsistently with increased processing time        General Comments      Exercises     Assessment/Plan    PT Assessment Patient does not need any further PT services  PT Problem List         PT Treatment Interventions      PT Goals (Current goals can be found in the Care Plan section)  Acute Rehab PT Goals PT Goal Formulation: Patient unable to participate in goal setting Time For Goal Achievement: 12/04/21 Potential to Achieve Goals: Fair    Frequency     Barriers to discharge        Co-evaluation PT/OT/SLP Co-Evaluation/Treatment: Yes Reason for Co-Treatment: For patient/therapist safety;To address functional/ADL transfers PT goals addressed during session: Mobility/safety with mobility         AM-PAC PT "6 Clicks" Mobility  Outcome Measure Help needed turning from your back to your side while in a flat bed without using bedrails?: Total Help needed moving from lying on your back to sitting on the side of a flat bed without using bedrails?: Total Help needed moving to and from a bed to a chair (including a wheelchair)?: Total Help needed standing up from a chair using your arms (e.g., wheelchair or bedside chair)?: Total Help needed to walk in hospital room?: Total Help needed climbing 3-5 steps with a railing? : Total 6 Click Score: 6    End of Session   Activity Tolerance: Patient tolerated treatment well Patient left: in bed;with call bell/phone within reach (on stretcher in ED) Nurse Communication: Mobility status PT Visit Diagnosis: Difficulty in walking, not elsewhere classified (R26.2);Other symptoms and signs involving the nervous system (W25.852)    Time: 7782-4235 PT Time Calculation (min) (ACUTE ONLY): 16 min   Charges:   PT Evaluation $PT Eval Moderate Complexity: 1 Mod          Reuel Derby, PT, DPT  Acute  Rehabilitation Services  Pager: [phone] Office: [phone]   Rudean Hitt 12/04/2021, 2:21 PM

## 2021-12-04 NOTE — ED Notes (Signed)
Pts brother [name] updated at this time.

## 2021-12-04 NOTE — ED Notes (Signed)
ED TO INPATIENT HANDOFF REPORT  ED Nurse Name and Phone #: Cindie Laroche [phone]  S Name/Age/Gender Holly Bradley 45 y.o. female Room/Bed: 037C/037C  Code Status   Code Status: Full Code  Home/SNF/Other [facility] Patient oriented to: self Is this baseline? No   Triage Complete: Triage complete  Chief Complaint Acute encephalopathy [G93.40]  Triage Note Pt BIB [facility] from [facility]. Pt febrile, warm to touch. Hx stroke with reported L sided deficits. Staff reports color change and foul odor to urine. Last seen normal yesterday, unknown time.    Allergies No Known Allergies  Level of Care/Admitting Diagnosis ED Disposition     ED Disposition  Admit   Condition  --   Comment  Hospital Area: [facility] [[id number]]  Level of Care: Telemetry Medical [[id number]]  May admit patient to [facility] or [facility] if equivalent level of care is available:: No  Covid Evaluation: Confirmed COVID Negative  Diagnosis: Acute encephalopathy [211941]  Admitting Physician: Lucious Groves [[id number]]  Attending Physician: Lucious Groves [[id number]]  Estimated length of stay: past midnight tomorrow  Certification:: I certify this patient will need inpatient services for at least 2 midnights          B Medical/Surgery History Past Medical History:  Diagnosis Date   CHF (congestive heart failure) ([place])    Chronic blood loss anemia    Related to uterine fibroids   Essential hypertension    Poorly controlled, repeat by report only on nicardipine 90 mg   Fibroid    Likely complicated by by menometrorrhagia and chronic anemia   Hypertension    Paroxysmal SVT (supraventricular tachycardia) (HCC)    Frequent episodes, can last anywhere from 20 minutes to 12-15 hours.  Not on beta-blocker or calcium channel blocker   Uterine fibroid    Past Surgical History:  Procedure Laterality Date   IR ANGIOGRAM FOLLOW UP STUDY  05/15/2021   IR AORTAGRAM  ABDOMINAL SERIALOGRAM  10/16/2021   IR CT HEAD LTD  05/15/2021   IR EMBO ARTERIAL NOT HEMORR HEMANG INC GUIDE ROADMAPPING  10/16/2021   IR PERCUTANEOUS ART THROMBECTOMY/INFUSION INTRACRANIAL INC DIAG ANGIO  05/15/2021       IR PERCUTANEOUS ART THROMBECTOMY/INFUSION INTRACRANIAL INC DIAG ANGIO  05/15/2021   IR TRANSCATH/EMBOLIZ  05/15/2021   IR US GUIDE VASC ACCESS RIGHT  10/16/2021   IVC FILTER INSERTION N/A 10/24/2021   Procedure: IVC FILTER INSERTION;  Surgeon: Serafina Mitchell, MD;  Location: [facility];  Service: Cardiovascular;  Laterality: N/A;   RADIOLOGY WITH ANESTHESIA N/A 05/15/2021   Procedure: IR WITH ANESTHESIA;  Surgeon: Radiologist, Medication, MD;  Location: [facility];  Service: Radiology;  Laterality: N/A;   Unknown       A IV Location/Drains/Wounds Patient Lines/Drains/Airways Status     Active Line/Drains/Airways     Name Placement date Placement time Site Days   Peripheral IV 12/03/21 22 G Distal;Left;Posterior Forearm 12/03/21  0000  Forearm  1   Peripheral IV 12/03/21 22 G Posterior;Right Hand 12/03/21  1600  Hand  1   External Urinary Catheter 11/23/21  1851  --  11   External Urinary Catheter 12/04/21  0913  --  less than 1   Pressure Injury 10/14/21 Buttocks Left Stage 2 -  Partial thickness loss of dermis presenting as a shallow open injury with a red, pink wound bed without slough. 10/14/21  1430  -- 51   Pressure Injury 11/22/21  Heel Left Unstageable - Full thickness tissue loss in which the base of the injury is covered by slough (yellow, tan, gray, green or brown) and/or eschar (tan, brown or black) in the wound bed. black eshar on heel 11/22/21  2000  -- 12            Intake/Output Last 24 hours  Intake/Output Summary (Last 24 hours) at 12/04/2021 1800 Last data filed at 12/04/2021 1647 Gross per 24 hour  Intake 1299.54 ml  Output --  Net 1299.54 ml    Labs/Imaging Results for orders placed or performed during the hospital encounter of  12/03/21 (from the past 48 hour(s))  Lactic acid, plasma     Status: None   Collection Time: 12/03/21  4:06 PM  Result Value Ref Range   Lactic Acid, Venous 1.5 0.5 - 1.9 mmol/L    Comment: Performed at [facility], [address] [place], [place] [address]  Comprehensive metabolic panel     Status: Abnormal   Collection Time: 12/03/21  4:06 PM  Result Value Ref Range   Sodium 139 135 - 145 mmol/L   Potassium 4.2 3.5 - 5.1 mmol/L   Chloride 102 98 - 111 mmol/L   CO2 23 22 - 32 mmol/L   Glucose, Bld 89 70 - 99 mg/dL    Comment: Glucose reference range applies only to samples taken after fasting for at least 8 hours.   BUN 96 (H) 6 - 20 mg/dL   Creatinine, Ser 2.50 (H) 0.44 - 1.00 mg/dL   Calcium 11.7 (H) 8.9 - 10.3 mg/dL   Total Protein 9.6 (H) 6.5 - 8.1 g/dL   Albumin 3.2 (L) 3.5 - 5.0 g/dL   AST 24 15 - 41 U/L   ALT 13 0 - 44 U/L   Alkaline Phosphatase 205 (H) 38 - 126 U/L   Total Bilirubin 0.5 0.3 - 1.2 mg/dL   GFR, Estimated 23 (L) >60 mL/min    Comment: (NOTE) Calculated using the CKD-EPI Creatinine Equation (2021)    Anion gap 14 5 - 15    Comment: Performed at [facility], [place] [address], [place], [place] [address]  CBC WITH DIFFERENTIAL     Status: Abnormal   Collection Time: 12/03/21  4:06 PM  Result Value Ref Range   WBC 17.6 (H) 4.0 - 10.5 K/uL   RBC 3.50 (L) 3.87 - 5.11 MIL/uL   Hemoglobin 8.9 (L) 12.0 - 15.0 g/dL   HCT 29.1 (L) 36.0 - 46.0 %   MCV 83.1 80.0 - 100.0 fL   MCH 25.4 (L) 26.0 - 34.0 pg   MCHC 30.6 30.0 - 36.0 g/dL   RDW 24.7 (H) 11.5 - 15.5 %   Platelets 452 (H) 150 - 400 K/uL   nRBC 0.0 0.0 - 0.2 %   Neutrophils Relative % 90 %   Neutro Abs 15.7 (H) 1.7 - 7.7 K/uL   Lymphocytes Relative 4 %   Lymphs Abs 0.8 0.7 - 4.0 K/uL   Monocytes Relative 5 %   Monocytes Absolute 0.9 0.1 - 1.0 K/uL   Eosinophils Relative 0 %   Eosinophils Absolute 0.1 0.0 - 0.5 K/uL   Basophils Relative 0 %   Basophils Absolute 0.0 0.0 - 0.1 K/uL    Immature Granulocytes 1 %   Abs Immature Granulocytes 0.14 (H) 0.00 - 0.07 K/uL    Comment: Performed at [facility], [address], [place], [place] [address]  Urine Culture  Status: None   Collection Time: 12/03/21  4:06 PM   Specimen: In/Out Cath Urine  Result Value Ref Range   Specimen Description IN/OUT CATH URINE    Special Requests NONE    Culture      NO GROWTH Performed at [facility], [place] [address] [place], [place] [address]    Report Status 12/04/2021 FINAL   Resp Panel by RT-PCR (Flu A&B, Covid) Nasopharyngeal Swab     Status: None   Collection Time: 12/03/21  4:12 PM   Specimen: Nasopharyngeal Swab; Nasopharyngeal(NP) swabs in vial transport medium  Result Value Ref Range   SARS Coronavirus 2 by RT PCR NEGATIVE NEGATIVE    Comment: (NOTE) SARS-CoV-2 target nucleic acids are NOT DETECTED.  The SARS-CoV-2 RNA is generally detectable in upper respiratory specimens during the acute phase of infection. The lowest concentration of SARS-CoV-2 viral copies this assay can detect is 138 copies/mL. A negative result does not preclude SARS-Cov-2 infection and should not be used as the sole basis for treatment or other patient management decisions. A negative result may occur with  improper specimen collection/handling, submission of specimen other than nasopharyngeal swab, presence of viral mutation(s) within the areas targeted by this assay, and inadequate number of viral copies(<138 copies/mL). A negative result must be combined with clinical observations, patient history, and epidemiological information. The expected result is Negative.  Fact Sheet for Patients:  EntrepreneurPulse.com.au  Fact Sheet for Healthcare Providers:  IncredibleEmployment.be  This test is no t yet approved or cleared by the [place] FDA and  has been authorized for detection and/or diagnosis of SARS-CoV-2 by FDA under an Emergency Use  Authorization (EUA). This EUA will remain  in effect (meaning this test can be used) for the duration of the COVID-19 declaration under Section 564(b)(1) of the Act, 21 U.S.C.section 360bbb-3(b)(1), unless the authorization is terminated  or revoked sooner.       Influenza A by PCR NEGATIVE NEGATIVE   Influenza B by PCR NEGATIVE NEGATIVE    Comment: (NOTE) The Xpert Xpress SARS-CoV-2/FLU/RSV plus assay is intended as an aid in the diagnosis of influenza from Nasopharyngeal swab specimens and should not be used as a sole basis for treatment. Nasal washings and aspirates are unacceptable for Xpert Xpress SARS-CoV-2/FLU/RSV testing.  Fact Sheet for Patients: EntrepreneurPulse.com.au  Fact Sheet for Healthcare Providers: IncredibleEmployment.be  This test is not yet approved or cleared by the [place] FDA and has been authorized for detection and/or diagnosis of SARS-CoV-2 by FDA under an Emergency Use Authorization (EUA). This EUA will remain in effect (meaning this test can be used) for the duration of the COVID-19 declaration under Section 564(b)(1) of the Act, 21 U.S.C. section 360bbb-3(b)(1), unless the authorization is terminated or revoked.  Performed at [facility], [place] [address] [place], [place] [address]   Ammonia     Status: Abnormal   Collection Time: 12/03/21  4:12 PM  Result Value Ref Range   Ammonia 58 (H) 9 - 35 umol/L    Comment: Performed at [facility], [place] [address] [place],  [address]  Urinalysis, Routine w reflex microscopic Urine, Catheterized     Status: Abnormal   Collection Time: 12/03/21  4:16 PM  Result Value Ref Range   Color, Urine YELLOW YELLOW   APPearance HAZY (A) CLEAR   Specific Gravity, Urine 1.020 1.005 - 1.030   pH 5.5 5.0 - 8.0   Glucose, UA NEGATIVE NEGATIVE mg/dL   Hgb  urine dipstick NEGATIVE NEGATIVE   Bilirubin Urine NEGATIVE NEGATIVE   Ketones, ur NEGATIVE NEGATIVE  mg/dL   Protein, ur 100 (A) NEGATIVE mg/dL   Nitrite NEGATIVE NEGATIVE   Leukocytes,Ua NEGATIVE NEGATIVE    Comment: Performed at [facility] [address] [place], [place] [address]  Urinalysis, Microscopic (reflex)     Status: Abnormal   Collection Time: 12/03/21  4:16 PM  Result Value Ref Range   RBC / HPF 0-5 0 - 5 RBC/hpf   WBC, UA 0-5 0 - 5 WBC/hpf   Bacteria, UA RARE (A) NONE SEEN   Squamous Epithelial / LPF 0-5 0 - 5   Mucus PRESENT    Hyaline Casts, UA PRESENT    Amorphous Crystal PRESENT     Comment: Performed at [facility], [place] [address], [place], [place] [address]  Blood Culture (routine x 2)     Status: None (Preliminary result)   Collection Time: 12/03/21  4:30 PM   Specimen: BLOOD RIGHT ARM  Result Value Ref Range   Specimen Description BLOOD RIGHT ARM    Special Requests      BOTTLES DRAWN AEROBIC AND ANAEROBIC Blood Culture results may not be optimal due to an inadequate volume of blood received in culture bottles   Culture      NO GROWTH < 24 HOURS Performed at [facility], [place] [address], [place], [place] [address]    Report Status PENDING   I-Stat venous blood gas, [facility] ED)     Status: Abnormal   Collection Time: 12/03/21  4:38 PM  Result Value Ref Range   pH, Ven 7.397 7.250 - 7.430   pCO2, Ven 46.5 44.0 - 60.0 mmHg   pO2, Ven 134.0 (H) 32.0 - 45.0 mmHg   Bicarbonate 28.6 (H) 20.0 - 28.0 mmol/L   TCO2 30 22 - 32 mmol/L   O2 Saturation 99.0 %   Acid-Base Excess 3.0 (H) 0.0 - 2.0 mmol/L   Sodium 141 135 - 145 mmol/L   Potassium 4.2 3.5 - 5.1 mmol/L   Calcium, Ion 1.36 1.15 - 1.40 mmol/L   HCT 31.0 (L) 36.0 - 46.0 %   Hemoglobin 10.5 (L) 12.0 - 15.0 g/dL   Sample type VENOUS   I-Stat beta hCG blood, ED     Status: None   Collection Time: 12/03/21  4:43 PM  Result Value Ref Range   I-stat hCG, quantitative <5.0 <5 mIU/mL   Comment 3            Comment:   GEST. AGE      CONC.  (mIU/mL)   <=1 WEEK        5 - 50     2 WEEKS        50 - 500     3 WEEKS       100 - 10,000     4 WEEKS     1,000 - 30,000        FEMALE AND NON-PREGNANT FEMALE:     LESS THAN 5 mIU/mL   Protime-INR     Status: Abnormal   Collection Time: 12/03/21  6:46 PM  Result Value Ref Range   Prothrombin Time 15.4 (H) 11.4 - 15.2 seconds   INR 1.2 0.8 - 1.2    Comment: (NOTE) INR goal varies based on device and disease states. Performed at [facility], [place] [address], [place], [place] [address]   APTT  Status: Abnormal   Collection Time: 12/03/21  6:46 PM  Result Value Ref Range   aPTT 38 (H) 24 - 36 seconds    Comment:        IF BASELINE aPTT IS ELEVATED, SUGGEST PATIENT RISK ASSESSMENT BE USED TO DETERMINE APPROPRIATE ANTICOAGULANT THERAPY. Performed at [facility], [place] [address] [place], [place] [address]   CBC WITH DIFFERENTIAL     Status: Abnormal   Collection Time: 12/04/21  3:28 AM  Result Value Ref Range   WBC 18.7 (H) 4.0 - 10.5 K/uL   RBC 3.31 (L) 3.87 - 5.11 MIL/uL   Hemoglobin 8.3 (L) 12.0 - 15.0 g/dL   HCT 27.2 (L) 36.0 - 46.0 %   MCV 82.2 80.0 - 100.0 fL   MCH 25.1 (L) 26.0 - 34.0 pg   MCHC 30.5 30.0 - 36.0 g/dL   RDW 24.1 (H) 11.5 - 15.5 %   Platelets 488 (H) 150 - 400 K/uL   nRBC 0.0 0.0 - 0.2 %   Neutrophils Relative % 90 %   Neutro Abs 16.8 (H) 1.7 - 7.7 K/uL   Lymphocytes Relative 4 %   Lymphs Abs 0.8 0.7 - 4.0 K/uL   Monocytes Relative 5 %   Monocytes Absolute 0.9 0.1 - 1.0 K/uL   Eosinophils Relative 0 %   Eosinophils Absolute 0.1 0.0 - 0.5 K/uL   Basophils Relative 0 %   Basophils Absolute 0.0 0.0 - 0.1 K/uL   Immature Granulocytes 1 %   Abs Immature Granulocytes 0.18 (H) 0.00 - 0.07 K/uL    Comment: Performed at [facility], [address], [place], [place] [address]  Comprehensive metabolic panel     Status: Abnormal   Collection Time: 12/04/21  3:28 AM  Result Value Ref Range   Sodium 140 135 - 145 mmol/L   Potassium 3.2 (L) 3.5 - 5.1 mmol/L    Comment: DELTA CHECK  NOTED   Chloride 103 98 - 111 mmol/L   CO2 23 22 - 32 mmol/L   Glucose, Bld 105 (H) 70 - 99 mg/dL    Comment: Glucose reference range applies only to samples taken after fasting for at least 8 hours.   BUN 90 (H) 6 - 20 mg/dL   Creatinine, Ser 2.04 (H) 0.44 - 1.00 mg/dL   Calcium 11.2 (H) 8.9 - 10.3 mg/dL   Total Protein 8.6 (H) 6.5 - 8.1 g/dL   Albumin 2.7 (L) 3.5 - 5.0 g/dL   AST 17 15 - 41 U/L   ALT 12 0 - 44 U/L   Alkaline Phosphatase 187 (H) 38 - 126 U/L   Total Bilirubin 1.1 0.3 - 1.2 mg/dL   GFR, Estimated 30 (L) >60 mL/min    Comment: (NOTE) Calculated using the CKD-EPI Creatinine Equation (2021)    Anion gap 14 5 - 15    Comment: Performed at [facility], [place] [address] [place], [place] [address]   CT Head Wo Contrast  Result Date: 12/03/2021 CLINICAL DATA:  Mental status change, unknown cause EXAM: CT HEAD WITHOUT CONTRAST TECHNIQUE: Contiguous axial images were obtained from the base of the skull through the vertex without intravenous contrast. COMPARISON:  November 21, 2021. FINDINGS: Brain: Encephalomalacia in the right MCA territory, similar. Right brainstem Wallerian degeneration. Embolization coils are again noted in the posterosuperior right temporal lobe. Additional patchy white matter hypoattenuation, nonspecific but compatible with chronic microvascular ischemic disease. Remote infarcts the left basal ganglia and left cerebellum. No  evidence of acute hemorrhage, mass lesion, new abnormal mass effect, or extra-axial fluid collection. Ex vacuo ventricular dilation of the right lateral ventricle, similar. No hydrocephalus. Vascular: No hyperdense vessel identified. Skull: No acute fracture. Sinuses/Orbits: Moderate paranasal sinus mucosal thickening. Unremarkable orbits. Other: No mastoid effusions. IMPRESSION: 1. No evidence of acute intracranial abnormality. MRI could provide more sensitive evaluation for acute infarct if clinically indicated. 2. Remote infarcts and  extensive chronic microvascular ischemic disease, as detailed above. Electronically Signed   By: Margaretha Sheffield M.D.   On: 12/03/2021 17:26   MR BRAIN WO CONTRAST  Result Date: 12/03/2021 CLINICAL DATA:  Mental status change EXAM: MRI HEAD WITHOUT CONTRAST TECHNIQUE: Multiplanar, multiecho pulse sequences of the brain and surrounding structures were obtained without intravenous contrast. COMPARISON:  11/22/2021 MRI, correlation is also made with 12/03/2021 CT head FINDINGS: Evaluation is limited by motion artifact. Brain: No restricted diffusion to suggest acute infarct. Redemonstrated extensive encephalomalacia in the right frontal, temporal, and parietal lobe, with additional lacunar infarcts in the cerebellum, pons, and basal ganglia. Confluent T2 hyperintense signal in the periventricular white matter, likely the sequela of severe chronic small vessel ischemic disease. Mild ex vacuo dilatation of the right lateral ventricle. No hydrocephalus or extra-axial collection. No acute hemorrhage, mass, mass effect, or midline shift. Findings consistent with wallerian degeneration in the right corticospinal tracts. Vascular: Normal flow voids. Skull and upper cervical spine: Normal marrow signal. Sinuses/Orbits: Mucosal thickening throughout the paranasal sinuses, most prominent in the right maxillary sinus. The orbits are unremarkable. Other: The mastoids are well aerated. IMPRESSION: No acute intracranial process. Electronically Signed   By: Merilyn Baba M.D.   On: 12/03/2021 22:50   DG Chest Port 1 View  Result Date: 12/03/2021 CLINICAL DATA:  Febrile, sepsis EXAM: PORTABLE CHEST 1 VIEW COMPARISON:  10/19/2021 FINDINGS: Single frontal view of the chest demonstrates an unremarkable cardiac silhouette. No airspace disease, effusion, or pneumothorax. No acute bony abnormality. IMPRESSION: 1. No acute intrathoracic process. Electronically Signed   By: Randa Ngo M.D.   On: 12/03/2021 17:17   DG Abd  Portable 1 View  Result Date: 12/03/2021 CLINICAL DATA:  Febrile, sepsis, abdominal swelling EXAM: PORTABLE ABDOMEN - 1 VIEW COMPARISON:  11/22/2021 FINDINGS: Supine frontal view of the abdomen and pelvis was performed. Stable IVC filter at the L3 level. No bowel obstruction or ileus. No masses or abnormal calcifications. No acute bony abnormalities. IMPRESSION: 1. Unremarkable bowel gas pattern. Electronically Signed   By: Randa Ngo M.D.   On: 12/03/2021 17:18   EEG adult  Result Date: 12/04/2021 Lora Havens, MD     12/04/2021  5:06 PM Patient Name: Ardelle Hal[place]urton MRN: [id number] Epilepsy Attending: Lora Havens Referring Physician/Provider: Virl Axe, Date: 12/04/2021 Duration: 25.06 mins Patient history: 45 yo female with PMH including CVA w/ residual left side weakness presented for acute encephalopathy. EEG to evaluate for seizure. Level of alertness: Awake AEDs during EEG study: None Technical aspects: This EEG study was done with scalp electrodes positioned according to the 10-20 International system of electrode placement. Electrical activity was acquired at a sampling rate of 500Hz  and reviewed with a high frequency filter of 70Hz  and a low frequency filter of 1Hz . EEG data were recorded continuously and digitally stored. Description: No posterior dominant rhythm was seen. EEG showed continuous generalized 3 to 6 Hz theta-delta slowing. Generalized periodic discharges with triphasic morphology were noted at 1.5-2Hz  at times. Hyperventilation and photic stimulation were not performed.   ABNORMALITY - Periodic  discharges with triphasic morphology, generalized ( GPDs) - Continuous slow, generalized IMPRESSION: This study showed generalized periodic discharges with triphasic morphology which can be on the ictal-interictal continuum. The morphology, frequency and reactivity are more likely suggestive of toxic-metabolic causes like hyperammonia. Additionally, study is suggestive of  moderate diffuse encephalopathy, nonspecific etiology. No seizures or epileptiform discharges were seen throughout the recording. [employer] (From admission, onward)     Start     Ordered   12/05/21 0093  Basic metabolic panel  Tomorrow morning,   R        12/04/21 1418   12/05/21 0430  CBC  Tomorrow morning,   R        12/04/21 1418   12/03/21 1606  Blood Culture (routine x 2)  (Undifferentiated presentation (screening labs and basic nursing orders))  BLOOD CULTURE X 2,   STAT      12/03/21 1606            Vitals/Pain Today's Vitals   12/04/21 1300 12/04/21 1400 12/04/21 1500 12/04/21 1700  BP: (!) 182/83 (!) 149/84 (!) 170/91 130/74  Pulse: 83 91 81 87  Resp: (!) 31 18 20 14   Temp:      TempSrc:      SpO2: 100% 100% 100% 100%    Isolation Precautions No active isolations  Medications Medications  heparin injection 5,000 Units (5,000 Units Subcutaneous Given 12/04/21 1311)  acetaminophen (TYLENOL) tablet 650 mg (has no administration in time range)    Or  acetaminophen (TYLENOL) suppository 650 mg (has no administration in time range)  lactated ringers infusion (0 mLs Intravenous Stopped 12/04/21 1303)  amiodarone (PACERONE) tablet 200 mg (200 mg Oral Given 12/04/21 1547)  aspirin chewable tablet 81 mg (81 mg Oral Given 12/04/21 1547)  atorvastatin (LIPITOR) tablet 40 mg (40 mg Oral Given 12/04/21 1546)  carvedilol (COREG) tablet 25 mg (25 mg Oral Not Given 12/04/21 1608)  cloNIDine (CATAPRES) tablet 0.1 mg (0.1 mg Oral Given 12/04/21 1550)  escitalopram (LEXAPRO) tablet 20 mg (20 mg Oral Given 12/04/21 1546)  hydrALAZINE (APRESOLINE) tablet 100 mg (100 mg Oral Given 12/04/21 1550)  pantoprazole (PROTONIX) EC tablet 40 mg (40 mg Oral Given 12/04/21 1547)  senna-docusate (Senokot-S) tablet 2 tablet (2 tablets Oral Given 12/04/21 1545)  isosorbide mononitrate (IMDUR) 24 hr tablet 60 mg (60 mg Oral Given 12/04/21 1546)  hydrALAZINE  (APRESOLINE) injection 5 mg (has no administration in time range)  lactated ringers bolus 1,000 mL (0 mLs Intravenous Stopped 12/03/21 1759)  potassium chloride 10 mEq in 100 mL IVPB (0 mEq Intravenous Stopped 12/04/21 1048)  lactated ringers bolus 500 mL (0 mLs Intravenous Stopped 12/04/21 1647)    Mobility non-ambulatory High fall risk   Focused Assessments Neuro Assessment Handoff:  Swallow screen pass? No  Cardiac Rhythm: Normal sinus rhythm       Neuro Assessment: Exceptions to WDL Neuro Checks:      Last Documented NIHSS Modified Score:   Has TPA been given? No If patient is a Neuro Trauma and patient is going to OR before floor call report to [place] nurse: [phone] or [phone]   R Recommendations: See Admitting Provider Note  Report given to:   Additional Notes: evaluated by speech and passed. Pt ate pureed lunch with no issues. Attempted to give crushed meds with apple sauce later on and pt pocketed apple sauce and meds. Swallow screen failed at that time.

## 2021-12-04 NOTE — Procedures (Signed)
Patient Name: Sokhna Christoph  MRN: [id number]  Epilepsy Attending: Lora Havens  Referring Physician/Provider: Virl Axe,  Date: 12/04/2021 Duration: 25.06 mins  Patient history: 45 yo female with PMH including CVA w/ residual left side weakness presented for acute encephalopathy. EEG to evaluate for seizure.  Level of alertness: Awake  AEDs during EEG study: None  Technical aspects: This EEG study was done with scalp electrodes positioned according to the 10-20 International system of electrode placement. Electrical activity was acquired at a sampling rate of 500Hz  and reviewed with a high frequency filter of 70Hz  and a low frequency filter of 1Hz . EEG data were recorded continuously and digitally stored.   Description: No posterior dominant rhythm was seen. EEG showed continuous generalized 3 to 6 Hz theta-delta slowing. Generalized periodic discharges with triphasic morphology were noted at 1.5-2Hz  at times. Hyperventilation and photic stimulation were not performed.     ABNORMALITY - Periodic discharges with triphasic morphology, generalized ( GPDs) - Continuous slow, generalized  IMPRESSION: This study showed generalized periodic discharges with triphasic morphology which can be on the ictal-interictal continuum. The morphology, frequency and reactivity are more likely suggestive of toxic-metabolic causes like hyperammonia.   Additionally, study is suggestive of moderate diffuse encephalopathy, nonspecific etiology.  No seizures or epileptiform discharges were seen throughout the recording.   Jenella Craigie [name]

## 2021-12-05 LAB — CBC
HCT: 24.7 % — ABNORMAL LOW (ref 36.0–46.0)
Hemoglobin: 7.8 g/dL — ABNORMAL LOW (ref 12.0–15.0)
MCH: 25.2 pg — ABNORMAL LOW (ref 26.0–34.0)
MCHC: 31.6 g/dL (ref 30.0–36.0)
MCV: 79.7 fL — ABNORMAL LOW (ref 80.0–100.0)
Platelets: 410 10*3/uL — ABNORMAL HIGH (ref 150–400)
RBC: 3.1 MIL/uL — ABNORMAL LOW (ref 3.87–5.11)
RDW: 23.9 % — ABNORMAL HIGH (ref 11.5–15.5)
WBC: 19.3 10*3/uL — ABNORMAL HIGH (ref 4.0–10.5)
nRBC: 0 % (ref 0.0–0.2)

## 2021-12-05 LAB — BASIC METABOLIC PANEL
Anion gap: 13 (ref 5–15)
BUN: 89 mg/dL — ABNORMAL HIGH (ref 6–20)
CO2: 19 mmol/L — ABNORMAL LOW (ref 22–32)
Calcium: 11 mg/dL — ABNORMAL HIGH (ref 8.9–10.3)
Chloride: 112 mmol/L — ABNORMAL HIGH (ref 98–111)
Creatinine, Ser: 1.42 mg/dL — ABNORMAL HIGH (ref 0.44–1.00)
GFR, Estimated: 46 mL/min — ABNORMAL LOW (ref >60)
Glucose, Bld: 101 mg/dL — ABNORMAL HIGH (ref 70–99)
Potassium: 4.8 mmol/L (ref 3.5–5.1)
Sodium: 144 mmol/L (ref 135–145)

## 2021-12-05 LAB — MAGNESIUM: Magnesium: 2 mg/dL (ref 1.7–2.4)

## 2021-12-05 LAB — AMMONIA: Ammonia: 38 umol/L — ABNORMAL HIGH (ref 9–35)

## 2021-12-05 MED ORDER — ORAL CARE MOUTH RINSE
15.0000 mL | Freq: Two times a day (BID) | OROMUCOSAL | Status: DC
  Administered 2021-12-05 – 2021-12-12 (×15): 15 mL via OROMUCOSAL

## 2021-12-05 NOTE — Progress Notes (Addendum)
Subjective: Nurse notes that pt had prolonged QT of 550 to 600. EKG showed NSR with corrected QT of 503. Nurse also noted that pt has had foul smelling vaginal discharge. Patient was seen at bedside during rounds this morning. She is non-verbal and responds to verbal stimuli. Able to nod yes when asked about pelvis pain, dysuria and fever. No other complains or concerns at this time.   Objective:  Vital signs in last 24 hours: Vitals:   12/05/21 0030 12/05/21 0400 12/05/21 0515 12/05/21 0727  BP:  124/81  140/75  Pulse: 87 98 91 85  Resp: 16 12 18 15   Temp: 99.4 F (37.4 C)  99.3 F (37.4 C) 98.6 F (37 C)  TempSrc: Axillary  Axillary Axillary  SpO2: 100% 98% 100% 100%   General Physical Exam: Constitutional: alert, well-appearing, in no acute distress. Non-verbal HENT: normocephalic, atraumatic, moist mucous membranes Eyes: conjunctiva non-erythematous, extraocular movements intact Neck: supple Cardiovascular: regular rate and rhythm, no m/r/g Pulmonary/Chest: normal work of breathing on room air, Lungs CTA.  Abdominal: soft, non-tender to palpation, non-distended MSK: normal bulk and tone Neurological: alert, unable to evaluate orientation due to nonverbal status. 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    Acute encephalopathy likely secondary to dehydration vs hypercalcemia vs possible seizures Pt is afebrile, has an isolated elevation in WBC count of 19.3, she  complains of pelvic pain, dysuria and is noted to have foul smelling vaginal discharge.  -Obtain cervicovaginal swab for STI testing. F/u with results. Pt also continues to have a moderate hypercalcemia with corrected Ca levels of 12 which can lead to altered mental status. Additionally, literature indicates that dehydration can lead to altered mental status in older patients. LR 500 cc given yesterday and pt is euvolemic at this time, but she is unable to tolerate any PO intake due to dysphagia.  -Obtain PTH and calcium levels  Studies also show that strokes are the most common cause of seizures in adults >35 yo which could also be a cause of the pt's symptoms at this time. However EEG shows activity suggestive of toxic-metabolic causes like hyperammonia and no evidence of seizures or epilepsy.  -Obtained Ammonia levels which are decreasing from 58 to 38 -Recheck CBC and BMP -Pt/OT following, appreciate recs -Palliative care consulted to address goals of care. Appreciate recs.    Prolonged QT Overnight nurse noted that pt had prolonged QT of 550 to 600. An EKG showed NSR with corrected QT of 503. While at bedside, pt was noted to have a Qtc of 550. -Discontinuing lexapro 20 mg and amiodarone 200 mg which are QT prolonging agents -Will obtain an EKG.   Acute kidney injury likely 2/2 dehydration Serum creatinine is decreasing to 1.42 from 2.04 yesterday with a baseline of 0.7. On physical exam, pt does have dry oral mucosal membranes. -Recheck BMP in the AM  Mild Hypokalemia Pt noted to have a serum potassium level of 4.8 compared to 3.2 yesterday.  -Replete PRN -Recheck levels on BMP  Hx LLE DVT Patient previously on Eliquis which was discontinued in the setting of recent uterine bleed (October 2022).  Patient is status post uterine artery embolization.  An IVC filter was placed to prevent PE.  Follow-up for removal in 3 to 6 months. 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  On exam, 0 out of 5 strength of the left side. 3 out of 5 strength of the right upper extremity.  This appears to be patient's baseline since recent stroke.  No new weakness noted.  Patient was nonverbal during exam, however responsive to verbal stimuli with eye contact and follows simple commands.   -Head MRI and CT are negative.   Chronic Problems:  Hypertension Pt's SBPs are elevated at 170-190s today.  -Will restart Clonidine 0.1  TID, 5 mg IV hydralazine Q4H for SBP >180 and Carvedilol 25 mg BID     Hyperlipidemia Pt has a history HLD and takes atorvastatin 40 mg QD. -Will restart Atorvastatin 40 mg QD     HF with recovered EF Recent echo in May 2022 revealed EF of 55-60% with grade II diastolic dysfunction and severe left atrial enlargement.  Vitals are stable; blood pressure acceptable. -Holding torsemide 20 mg twice daily until hypercalcemia resolves and will consider restarting if pt's symptoms resolve.  -restarting Imdur 60 mg QD, ASA 81 mg QD and Carvedilol 25 mg BID    Paroxysmal SVT EKG is sinus rhythm with no new changes compared to previous tracings.  Home medication includes amiodarone 200 mg daily. -discontinued amiodarone 200 mg QD  Continue following home medications: Protonix 40 mg QD   Best Practice: Diet:  dysphagia 1 IVF: LR, 500 cc bolus VTE: Heparin Code: Full  Signature: Claudean Kinds, [facility]  Pager: [phone] 7:34 AM, 12/05/2021    Attestation for Student Documentation:  I personally was present and performed or re-performed the history, physical exam and medical decision-making activities of this service and have verified that the service and findings are accurately documented in the students note.  Virl Axe, MD 12/05/2021, 5:38 PM

## 2021-12-05 NOTE — Plan of Care (Signed)

## 2021-12-05 NOTE — Progress Notes (Signed)
Attempted to give morning medications crushed with apple sauce but patient is pocketing it in her mouth. MD made aware.

## 2021-12-05 NOTE — Consult Note (Signed)
[place] Nurse Consult Note: Reason for Consult:Deep tissue injury to sacrum, noted on admission. Left heel unstageable pressure injury Right heel nonblanchable erythema Wound type:pressure Pressure Injury POA: Yes Measurement: Sacrum:  4 cm x 2 cm intact maroon discoloration LEft heel:  2 cmx 2 cm lesion with eschar to wound bed Right heel 1 cm x 2 cm intact nonblanchable erythema Wound bed:see above Drainage (amount, consistency, odor) minimal serosanguinous  no odor Periwound:intact Dressing procedure/placement/frequency: Cleanse sacrum with soap and water and apply sacral foam  change every three days.  Turn and reposition every two hours.  Mattress with low air loss feature.  Bilateral heels:  off load pressure and prevalon boot.  Lesion to left heel open to air or may apply foam dressing if begins to drain.   Will not follow at this time.  Please re-consult if needed.  Domenic Moras MSN, RN, FNP-BC CWON Wound, Ostomy, Continence Nurse Pager [phone]

## 2021-12-05 NOTE — Progress Notes (Signed)
Speech Language Pathology Treatment: Dysphagia  Patient Details Name: Holly Bradley MRN: [id number] DOB: 09/24/1976 Today's Date: 12/05/2021 Time: 8250-5397 SLP Time Calculation (min) (ACUTE ONLY): 20 min  Assessment / Plan / Recommendation Clinical Impression  Pt was seen for dysphagia treatment. She was alert throughout the session, but with minimal verbal output of single-word utterances and a frequent tendency to gesture in response to questions. Pt's RN reported that the pt demonstrated oral holding with pills that were crushed with applesauce and that they were ultimately suctioned since pt did not swallow after 2-3 minutes had passed. Pt demonstrated oral holding of 15-30 seconds with purees, and fewer than 15 seconds with nectar thick liquids. She tolerated purees and nectar thick liquids without overt s/sx of aspiration. Mild residue was noted in the anterior and right lateral sulci, but this was improved with a liquid wash. Pt demonstrated coughing with ice chips, suggesting possible aspiration. SLP questions the impact of the flavor of crushed meds being a contributing factor in her oral holding. The possibility of trying it with pudding was discussed with RN. Pt's current diet of dysphagia 1 and nectar thick liquids will be continued at this time and Sujata, RN has verbalized agreement with contacted SLP if pt has similar difficulty with lunch.    HPI HPI: 45yo female admitted 12/03/21 with AMS. MRI brain was negative. EEG 12/13: generalized periodic discharges with triphasic morphology which can be on the ictal-interictal continuum. Hx of right MCA CVA 04/2021 with thrombectomy, multiple intubations, trach and decannulation, D/Cd to [facility] 7/22. Several MBS studies - the last was 06/21/21, which revealed mild dysphagia with oral residue, occasional trace aspiration of thin liquids with strong cough response. Dys3/thin liquids was recommended at D/C. Pt was last seen by SLP 11/22/21 and a  dysphagia 1 diet with nectar thick liquids was recommended at that time. PMH: DVT, HTN, HF with recovered EF, paroxsymal SVT, uterine fibroids.      SLP Plan  Continue with current plan of care      Recommendations for follow up therapy are one component of a multi-disciplinary discharge planning process, led by the attending physician.  Recommendations may be updated based on patient status, additional functional criteria and insurance authorization.    Recommendations  Diet recommendations: Dysphagia 1 (puree);Nectar-thick liquid Liquids provided via: Cup;Straw Medication Administration: Crushed with puree Supervision: Trained caregiver to feed patient;Full supervision/cueing for compensatory strategies Compensations: Minimize environmental distractions;Slow rate;Small sips/bites;Follow solids with liquid Postural Changes and/or Swallow Maneuvers: Seated upright 90 degrees                Oral Care Recommendations: Oral care BID;Staff/trained caregiver to provide oral care Follow Up Recommendations: Skilled nursing-short term rehab (<3 hours/day) Assistance recommended at discharge: Frequent or constant Supervision/Assistance SLP Visit Diagnosis: Dysphagia, unspecified (R13.10) Plan: Continue with current plan of care          Terrance Lanahan I. Hardin Negus, [place], [place] Office number [phone] Pager [place]  12/05/2021, 11:57 AM

## 2021-12-05 NOTE — Progress Notes (Signed)
°  Transition of Care [facility]) Screening Note   Patient Details  Name: Holly Bradley Date of Birth: Apr 27, 1976   Transition of Care [facility]) CM/SW Contact:    Cyndi Bender, RN Phone Number: 12/05/2021, 8:45 AM    Transition of Care Department [facility]) has reviewed patient and no TOC needs have been identified at this time.   [facility].   We will continue to monitor patient advancement through interdisciplinary progression rounds. If new patient transition needs arise, please place a TOC consult.

## 2021-12-06 DIAGNOSIS — Z8673 Personal history of transient ischemic attack (TIA), and cerebral infarction without residual deficits: Secondary | ICD-10-CM

## 2021-12-06 DIAGNOSIS — Z515 Encounter for palliative care: Secondary | ICD-10-CM

## 2021-12-06 DIAGNOSIS — Z7189 Other specified counseling: Secondary | ICD-10-CM

## 2021-12-06 DIAGNOSIS — Z66 Do not resuscitate: Secondary | ICD-10-CM

## 2021-12-06 DIAGNOSIS — R4701 Aphasia: Secondary | ICD-10-CM

## 2021-12-06 LAB — CERVICOVAGINAL ANCILLARY ONLY
Bacterial Vaginitis (gardnerella): NEGATIVE
Candida Glabrata: NEGATIVE
Candida Vaginitis: NEGATIVE
Chlamydia: NEGATIVE
Comment: NEGATIVE
Comment: NEGATIVE
Comment: NEGATIVE
Comment: NEGATIVE
Comment: NEGATIVE
Comment: NORMAL
Neisseria Gonorrhea: NEGATIVE
Trichomonas: NEGATIVE

## 2021-12-06 LAB — CBC
HCT: 20.8 % — ABNORMAL LOW (ref 36.0–46.0)
HCT: 23.9 % — ABNORMAL LOW (ref 36.0–46.0)
Hemoglobin: 6.8 g/dL — CL (ref 12.0–15.0)
Hemoglobin: 7.6 g/dL — ABNORMAL LOW (ref 12.0–15.0)
MCH: 26.1 pg (ref 26.0–34.0)
MCH: 26 pg (ref 26.0–34.0)
MCHC: 32.7 g/dL (ref 30.0–36.0)
MCHC: 31.8 g/dL (ref 30.0–36.0)
MCV: 79.7 fL — ABNORMAL LOW (ref 80.0–100.0)
MCV: 81.8 fL (ref 80.0–100.0)
Platelets: 395 10*3/uL (ref 150–400)
Platelets: 405 10*3/uL — ABNORMAL HIGH (ref 150–400)
RBC: 2.61 MIL/uL — ABNORMAL LOW (ref 3.87–5.11)
RBC: 2.92 MIL/uL — ABNORMAL LOW (ref 3.87–5.11)
RDW: 24.2 % — ABNORMAL HIGH (ref 11.5–15.5)
RDW: 22.5 % — ABNORMAL HIGH (ref 11.5–15.5)
WBC: 20 10*3/uL — ABNORMAL HIGH (ref 4.0–10.5)
WBC: 21.5 10*3/uL — ABNORMAL HIGH (ref 4.0–10.5)
nRBC: 0 % (ref 0.0–0.2)
nRBC: 0 % (ref 0.0–0.2)

## 2021-12-06 LAB — BASIC METABOLIC PANEL
Anion gap: 10 (ref 5–15)
BUN: 76 mg/dL — ABNORMAL HIGH (ref 6–20)
CO2: 24 mmol/L (ref 22–32)
Calcium: 10.9 mg/dL — ABNORMAL HIGH (ref 8.9–10.3)
Chloride: 111 mmol/L (ref 98–111)
Creatinine, Ser: 1.4 mg/dL — ABNORMAL HIGH (ref 0.44–1.00)
GFR, Estimated: 47 mL/min — ABNORMAL LOW (ref >60)
Glucose, Bld: 121 mg/dL — ABNORMAL HIGH (ref 70–99)
Potassium: 3.3 mmol/L — ABNORMAL LOW (ref 3.5–5.1)
Sodium: 145 mmol/L (ref 135–145)

## 2021-12-06 LAB — PREPARE RBC (CROSSMATCH)

## 2021-12-06 LAB — PTH, INTACT AND CALCIUM
Calcium, Total (PTH): 11.5 mg/dL — ABNORMAL HIGH (ref 8.7–10.2)
PTH: 14 pg/mL — ABNORMAL LOW (ref 15–65)

## 2021-12-06 MED ORDER — POTASSIUM CHLORIDE 10 MEQ/100ML IV SOLN
10.0000 meq | INTRAVENOUS | Status: AC
  Administered 2021-12-06 (×3): 10 meq via INTRAVENOUS
  Filled 2021-12-06 (×3): qty 100

## 2021-12-06 MED ORDER — SODIUM CHLORIDE 0.9% IV SOLUTION: Freq: Once | INTRAVENOUS | Status: AC

## 2021-12-06 NOTE — Progress Notes (Signed)
Speech Language Pathology Treatment: Dysphagia  Patient Details Name: Holly Bradley MRN: [id number] DOB: [birth year] Today's Date: 12/06/2021 Time: 3094-0768 SLP Time Calculation (min) (ACUTE ONLY): 18 min  Assessment / Plan / Recommendation Clinical Impression  Pt was seen for dysphagia treatment. She was alert and more engaged than during the prior session. Pt communicated verbally, but output was limited and meaning was reduced at the phrase level. Pt demonstrated improved swallow function compared to yesterday. Oral holding was not demonstrated, but she exhibited prolonged bolus manipulation with purees and minimal oral residue. Oral phase was functional with nectar thick liquids, and no signs of aspiration were noted with solids or with nectar thick liquids. Pt demonstrated thorough, but mildly prolonged, mastication of regular texture solids. Pt reported that she typically eats this consistency, but her reliability is questioned. SLP will follow up for possible instrumental assessment for re-assessment if she at least maintains this level of performance.    HPI HPI: Pt is a 45 y/o female admitted 12/03/21 with AMS. MRI brain was negative. EEG 12/13: generalized periodic discharges with triphasic morphology which can be on the ictal-interictal continuum. Hx of right MCA CVA 04/2021 with thrombectomy, multiple intubations, trach and decannulation, D/Cd to [facility] 7/22. Several MBS studies - the last was 06/21/21, which revealed mild dysphagia with oral residue, occasional trace aspiration of thin liquids with strong cough response. Dys3/thin liquids was recommended at D/C. Pt was last seen by SLP 11/22/21 and a dysphagia 1 diet with nectar thick liquids was recommended at that time. PMH: DVT, HTN, HF with recovered EF, paroxsymal SVT, uterine fibroids.      SLP Plan  Continue with current plan of care      Recommendations for follow up therapy are one component of a multi-disciplinary discharge  planning process, led by the attending physician.  Recommendations may be updated based on patient status, additional functional criteria and insurance authorization.    Recommendations  Diet recommendations: Dysphagia 1 (puree);Nectar-thick liquid Liquids provided via: Cup;Straw Medication Administration: Crushed with puree Supervision: Trained caregiver to feed patient;Full supervision/cueing for compensatory strategies Compensations: Minimize environmental distractions;Slow rate;Small sips/bites;Follow solids with liquid Postural Changes and/or Swallow Maneuvers: Seated upright 90 degrees                Oral Care Recommendations: Oral care BID;Staff/trained caregiver to provide oral care Follow Up Recommendations: Skilled nursing-short term rehab (<3 hours/day) Assistance recommended at discharge: Frequent or constant Supervision/Assistance SLP Visit Diagnosis: Dysphagia, unspecified (R13.10) Plan: Continue with current plan of care         Holly Bradley I. Holly Bradley, [place], [place] Office number [phone] Pager [place]  12/06/2021, 3:30 PM

## 2021-12-06 NOTE — Progress Notes (Signed)
Subjective: Overnight pt had a drop in hemoglobin to 6.8 and pt was given 1 unit of PRBC. Per nurse, pt was noted to have tea colored urine but did not notice hemoptysis, hematemesis, blood in stool or bright red blood in urine. Patient was seen at bedside during rounds this morning. She is able to talk some today. She is complaining of lower abdominal/pelvic pain and nods yes when asked if she feels feverish. No other complains or concerns at this time.   Objective:  Vital signs in last 24 hours: Vitals:   12/06/21 0304 12/06/21 0640 12/06/21 0655 12/06/21 0700  BP: 126/73 134/86 (!) 140/92 (!) 140/92  Pulse: 86 78  80  Resp: 16 11  16   Temp: 97.9 F (36.6 C) 98.2 F (36.8 C) 98.1 F (36.7 C)   TempSrc: Axillary Oral Oral Oral  SpO2: 98% 99%  100%   General Physical Exam: Constitutional: alert, well-appearing, in no acute distress. Minimally verbal HENT: normocephalic, atraumatic, moist mucous membranes Eyes: conjunctiva non-erythematous, extraocular movements intact Neck: supple Cardiovascular: regular rate and rhythm, no m/r/g Pulmonary/Chest: normal work of breathing on room air, Lungs CTA.  Abdominal: soft, non-tender to palpation, non-distended Genitourinary: foul smelling dark vaginal discharge noted. Pelvic tenderness on palpation. PureWick in place.  MSK: normal bulk and tone Neurological: alert, unable to evaluate orientation due to nonverbal status. 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    Acute encephalopathy likely secondary to dehydration vs hypercalcemia vs infection Pt is afebrile, has an isolated elevation in WBC count of 20, continues to complain of pelvic pain and has foul smelling, dark vaginal discharge. The decrease in Hgb to 6.7 along with dark vaginal discharge could be indicative of vaginal source of bleeding. In the setting of a recent uterine artery embolization for fibroids, plan is to obtain CT abd/pelvis to evaluate for a source of bleeding and infection. Previously, we obtained a cervicovaginal swab for STI testing which came back negative for gonorrhea, candida, chlamydia, trichomonas, and gardnerella. Pt has mild hypercalcemia with corrected Ca levels of 11.9 today and mental status is improving. Additionally, literature indicates that dehydration can lead to altered mental status in older patients. Pt is euvolemic at this time.  -PTH levels are low at 14 and total calcium levels are elevated at 11.5 which is consistent with a reactive decrease in PTH due to increase in calcium levels. -Recheck CBC and BMP -Pt/OT following, appreciate recs -Palliative care will contact family to address goals of care. Appreciate recs.  -Will obtain CT abdomen/pelvis  -Blood cultures and urine cultures are negative at this time.  Mild Hypokalemia  Pt's potassium levels are 3.3 today which decreased from 4.8 yesterday.  -10 mEq of Kcl x3 given via IV.   -Will recheck BMP in the AM  Prolonged QT On the previous night, pt had prolonged QT of 550 to 600. An EKG showed NSR with corrected QT of 503. While at bedside, pt was noted to have a Qtc of 550. -Discontinuing lexapro 20 mg and  amiodarone 200 mg which are QT prolonging agents -EKG showed NSR with QTc of 480   Acute kidney injury likely 2/2 dehydration Serum creatinine is decreasing to 1.42 from 2.04 yesterday with a baseline of 0.7. On physical exam, pt does have dry oral mucosal  membranes. -Recheck BMP in the AM   Hx LLE DVT Patient previously on Eliquis which was discontinued in the setting of recent uterine bleed (October 2022).  Patient is status post uterine artery embolization.  An IVC filter was placed to prevent PE.  Follow-up for removal in 3 to 6 months. 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  On exam, 0 out of 5 strength of the left side. 3 out of 5 strength of the right upper extremity.  This appears to be patient's baseline since recent stroke.  No new weakness noted.  Patient was nonverbal during exam, however responsive to verbal stimuli with eye contact and follows simple commands.   -Head MRI and CT are negative.   Chronic Problems:  Hypertension Pt's SBPs are elevated at 170-190s today.  -Will restart Clonidine 0.1  TID, 5 mg IV hydralazine Q4H for SBP >180 and Carvedilol 25 mg BID     Hyperlipidemia Pt has a history HLD and takes atorvastatin 40 mg QD. -Will restart Atorvastatin 40 mg QD   HF with recovered EF Recent echo in May 2022 revealed EF of 55-60% with grade II diastolic dysfunction and severe left atrial enlargement.  Vitals are stable; blood pressure acceptable. -Holding torsemide 20 mg twice daily until hypercalcemia resolves and will consider restarting if pt's symptoms resolve.  -restarting Imdur 60 mg QD, ASA 81 mg QD and Carvedilol 25 mg BID    Paroxysmal SVT EKG is sinus rhythm with no new changes compared to previous tracings.  Home medication includes amiodarone 200 mg daily. -discontinued amiodarone 200 mg QD  Continue following home medications: Protonix 40 mg QD   Best Practice: Diet:  dysphagia 1 IVF: NS  VTE: Heparin Code: Full  Signature: Claudean Kinds, [facility]  Pager: [phone] 7:42 AM, 12/06/2021    Attestation for Student Documentation:  I  personally was present and performed or re-performed the history, physical exam and medical decision-making activities of this service and have verified that the service and findings are accurately documented in the students note.  Claudean Kinds, Medical Student 12/06/2021, 7:42 AM

## 2021-12-06 NOTE — Consult Note (Signed)
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  Additional history includes of DVT, chronic heart failure with recovered EF, paroxysmal SVT, hypertension, and uterine fibroids s/p embolization. She presented to the emergency department on 12/03/2021 with altered mental status. Patient resides at [facility] and facility staff noted change in mental status from baseline. In the ED, she was found to be febrile at 100.4 with labs significant for leukocytosis of 17.6 and elevated creatinine of 2.5. Chest x-ray was unremarkable. CT head shows no evidence of acute intracranial abnormalities. Admitted to IMTS for further evaluation of acute encephalopathy.  12/13 - EEG negative for seizures and is suggestive of moderate diffuse encephalopathy  Clinical Assessment and Goals of Care: I have reviewed medical records including EPIC notes, labs and imaging, and examined the patient. She is alert and able to answer simple questions, mostly by nodding yes/no but also with intermittent verbalization of some words. She is oriented to person and place. She is able to communicate she is having abdominal pain.I updated her on her current medical condition. I asked her about code status. She is able to tell me to tell me she would not want CPR or intubation in the event of cardiopulmonary arrest.    I spoke with patient's brother/Holly Bradley to discuss diagnosis, prognosis, GOC, EOL wishes, disposition, and options. Patient is known  to our service from previous hospitalizations, initially seen in June 2022 and most recently in October 2022. I re-introduced Palliative Medicine as specialized medical care for people living with serious illness. It focuses on providing relief from the symptoms and stress of a serious illness.   We discussed a brief life review of the patient. 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 She has never been married and does not have children. Holly Bradley shares that Holly Bradley has struggled with severe depression throughout her life.   Holly Bradley has resided in a SNF since her stroke in May 2022. As far as functional status, she has significant residual deficits from the stroke. She is completely bed-bound and unable to move her left side. She has severe aphasia. She also has dysphagia.   We discussed her current illness and what it means in the larger context of her ongoing co-morbidities. Discussed concern for possible abdominal or pelvic infection. We also discussed the natural disease trajectory of a sudden, severe neurological injury such as stroke. Discussed that patient may not return to his/her previous baseline. Provided education that prognostication can be challenging due to the uncertainty predicting neurologic outcome. However, by now there has been adequate time to allow for optimal neurologic recovery.   I attempted to elicit  values and goals of care important to the patient. Holly Bradley states that [name] has lived a "hard life", even prior to the stroke. He acknowledges that her current quality of life is poor. She would not want to have lived in a facility, but unfortunately he cannot care for her at home. Discussed that complications such as infection are often a recurrent issue for patients who are bed-bound.   The difference between full scope intervention and comfort care was considered in light of the patient's goals of care.  At this time Holly Bradley would like to continue current medical interventions  with watchful waiting. He is agreeable to continue to gain answers through test/procedures and will make stepwise decisions based off of that information.  Brief discussion was had around goals if patient declines - he is open to continue [place] conversations at that time.  We discussed code status. I told Holly Bradley that [name] had clearly indicated to me that she would not want resuscitation efforts including CPR or intubation. I provided education that DNR/DNI is a protective measure to keep [place] from harming the patient in their last moments of life. Holly Bradley understands her prognosis would be poor in the event of cardiopulmonary arrest and is supportive of her decision not to be resuscitated.   Questions and concerns were addressed.  Holly Bradley was encouraged to call with questions or concerns.    Primary decision maker: patient's brother Holly Bradley    SUMMARY OF RECOMMENDATIONS   Code status changed to DNR/DNI Continue current care with watchful waiting Further [place] discussions pending patient's clinical course PMT will continue to follow  Code Status/Advance Care Planning: DNR  Prognosis:  Unable to determine,   Discharge Planning: To Be Determined      Primary Diagnoses: Present on Admission:  Acute encephalopathy  Pressure injury of skin  Hypertension  Hypercalcemia  AKI (acute kidney injury) ([place])   I have reviewed the medical record, interviewed the patient and family, and examined the patient. The following aspects are pertinent.  Past Medical History:  Diagnosis Date   CHF (congestive heart failure) (HCC)    Chronic blood loss anemia    Related to uterine fibroids   Essential hypertension    Poorly controlled, repeat by report only on nicardipine 90 mg   Fibroid    Likely complicated by by menometrorrhagia and chronic anemia   Hypertension    Paroxysmal SVT (supraventricular tachycardia) (HCC)    Frequent episodes, can last anywhere from 20 minutes to 12-15 hours.  Not on  beta-blocker or calcium channel blocker   Uterine fibroid      Family History  Problem Relation Age of Onset   Dementia Mother    Scheduled Meds:  aspirin  81 mg Oral Daily   atorvastatin  40 mg Oral Daily   carvedilol  25 mg Oral BID WC   cloNIDine  0.1 mg Oral TID   heparin  5,000 Units Subcutaneous Q8H   hydrALAZINE  100 mg Oral TID   isosorbide mononitrate  60 mg Oral Daily   mouth rinse  15 mL Mouth Rinse BID   pantoprazole  40 mg Oral Daily   senna-docusate  2 tablet Oral BID   Continuous Infusions: PRN Meds:.acetaminophen **OR** acetaminophen, hydrALAZINE   No Known Allergies Review of Systems  Gastrointestinal:  Positive for abdominal pain.   Physical Exam Vitals reviewed.  Constitutional:      General: She is not in acute distress.    Comments: Chronically ill-appearing  Pulmonary:  Effort: Pulmonary effort is normal.  Neurological:     Mental Status: She is alert.     Motor: Weakness present.     Comments: Left sided hemiplegia Oriented to person and plac  Psychiatric:        Mood and Affect: Affect is tearful.     Comments: Aphasia, minimally verbal    Vital Signs: BP 137/88 (BP Location: Right Arm)    Pulse 81    Temp 98.5 F (36.9 C) (Oral)    Resp 14    SpO2 100%  Pain Scale: 0-10   Pain Score: 0-No pain   SpO2: SpO2: 100 % O2 Device:SpO2: 100 % O2 Flow Rate: .    Palliative Assessment/Data: PPS 20%     Time In: 1130 Time Out: 1242 Time Total: 72 minutes Greater than 50%  of this time was spent counseling and coordinating care related to the above assessment and plan.  Signed by: Lavena Bullion, NP   Please contact Palliative Medicine Team phone at [phone] for questions and concerns.  For individual provider: See Shea Evans

## 2021-12-07 ENCOUNTER — Inpatient Hospital Stay (HOSPITAL_COMMUNITY): Payer: Medicaid Other

## 2021-12-07 LAB — CBC WITH DIFFERENTIAL/PLATELET
Abs Immature Granulocytes: 0.46 10*3/uL — ABNORMAL HIGH (ref 0.00–0.07)
Basophils Absolute: 0 10*3/uL (ref 0.0–0.1)
Basophils Relative: 0 %
Eosinophils Absolute: 0 10*3/uL (ref 0.0–0.5)
Eosinophils Relative: 0 %
HCT: 22.6 % — ABNORMAL LOW (ref 36.0–46.0)
Hemoglobin: 7.4 g/dL — ABNORMAL LOW (ref 12.0–15.0)
Immature Granulocytes: 2 %
Lymphocytes Relative: 4 %
Lymphs Abs: 0.9 10*3/uL (ref 0.7–4.0)
MCH: 26.5 pg (ref 26.0–34.0)
MCHC: 32.7 g/dL (ref 30.0–36.0)
MCV: 81 fL (ref 80.0–100.0)
Monocytes Absolute: 0.9 10*3/uL (ref 0.1–1.0)
Monocytes Relative: 4 %
Neutro Abs: 19.9 10*3/uL — ABNORMAL HIGH (ref 1.7–7.7)
Neutrophils Relative %: 90 %
Platelets: 367 10*3/uL (ref 150–400)
RBC: 2.79 MIL/uL — ABNORMAL LOW (ref 3.87–5.11)
RDW: 22.4 % — ABNORMAL HIGH (ref 11.5–15.5)
WBC: 22.2 10*3/uL — ABNORMAL HIGH (ref 4.0–10.5)
nRBC: 0 % (ref 0.0–0.2)

## 2021-12-07 LAB — COMPREHENSIVE METABOLIC PANEL
ALT: 11 U/L (ref 0–44)
AST: 18 U/L (ref 15–41)
Albumin: 2.3 g/dL — ABNORMAL LOW (ref 3.5–5.0)
Alkaline Phosphatase: 159 U/L — ABNORMAL HIGH (ref 38–126)
Anion gap: 8 (ref 5–15)
BUN: 76 mg/dL — ABNORMAL HIGH (ref 6–20)
CO2: 23 mmol/L (ref 22–32)
Calcium: 10.9 mg/dL — ABNORMAL HIGH (ref 8.9–10.3)
Chloride: 110 mmol/L (ref 98–111)
Creatinine, Ser: 1.37 mg/dL — ABNORMAL HIGH (ref 0.44–1.00)
GFR, Estimated: 48 mL/min — ABNORMAL LOW (ref >60)
Glucose, Bld: 112 mg/dL — ABNORMAL HIGH (ref 70–99)
Potassium: 3.9 mmol/L (ref 3.5–5.1)
Sodium: 141 mmol/L (ref 135–145)
Total Bilirubin: 1 mg/dL (ref 0.3–1.2)
Total Protein: 7.5 g/dL (ref 6.5–8.1)

## 2021-12-07 LAB — CBC
HCT: 23.9 % — ABNORMAL LOW (ref 36.0–46.0)
Hemoglobin: 7.5 g/dL — ABNORMAL LOW (ref 12.0–15.0)
MCH: 25.9 pg — ABNORMAL LOW (ref 26.0–34.0)
MCHC: 31.4 g/dL (ref 30.0–36.0)
MCV: 82.4 fL (ref 80.0–100.0)
Platelets: 404 10*3/uL — ABNORMAL HIGH (ref 150–400)
RBC: 2.9 MIL/uL — ABNORMAL LOW (ref 3.87–5.11)
RDW: 22.6 % — ABNORMAL HIGH (ref 11.5–15.5)
WBC: 25.6 10*3/uL — ABNORMAL HIGH (ref 4.0–10.5)
nRBC: 0 % (ref 0.0–0.2)

## 2021-12-07 LAB — VITAMIN D 25 HYDROXY (VIT D DEFICIENCY, FRACTURES): Vit D, 25-Hydroxy: 63.85 ng/mL (ref 30–100)

## 2021-12-07 IMAGING — CT CT ABD-PELV W/ CM
2 of 5 series · 15 of 46 positions shown, 17 images · IV contrast (Omni 300)
Comparison: [DATE]

CLINICAL DATA: Left lower quadrant pain.  Leukocytosis.

EXAM:
CT ABDOMEN AND PELVIS WITH CONTRAST
TECHNIQUE: Multidetector CT imaging of the abdomen and pelvis was performed
using the standard protocol following bolus administration of
intravenous contrast.
CONTRAST:  75mL OMNIPAQUE IOHEXOL 350 MG/ML SOLN

[Series 3: a/p w/ 5mm · axial · 0.74mm/px · z∈[-963,-498]mm · 12 of 105 slices shown, 14 images]
[im 6/105  soft-tissue]
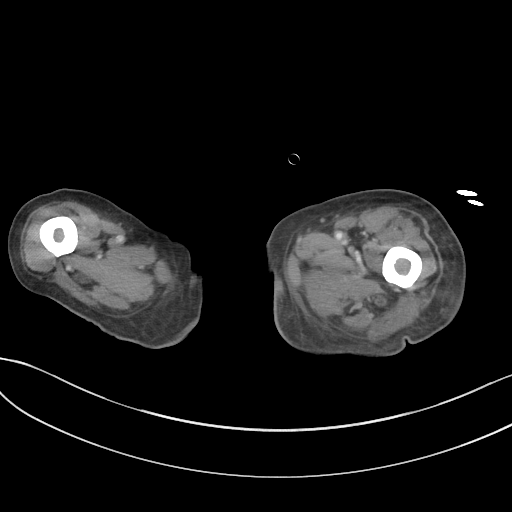
[im 6/105  bone]
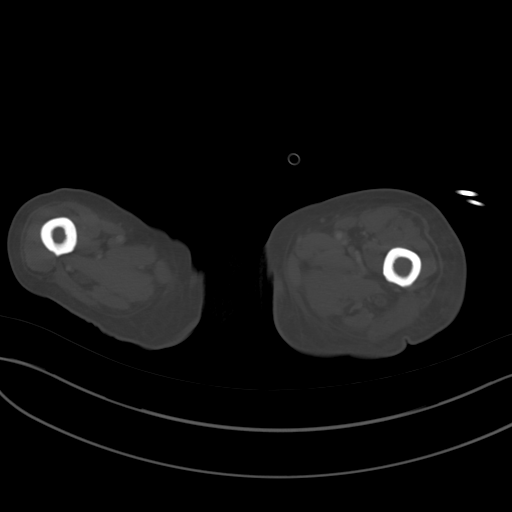
[im 17/105  soft-tissue]
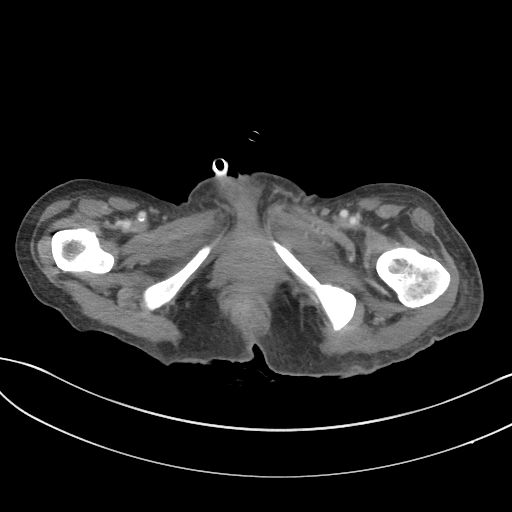
[im 22/105  soft-tissue]
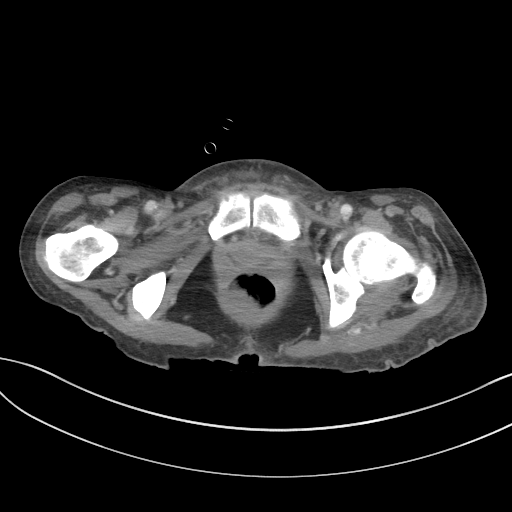
[im 33/105  soft-tissue]
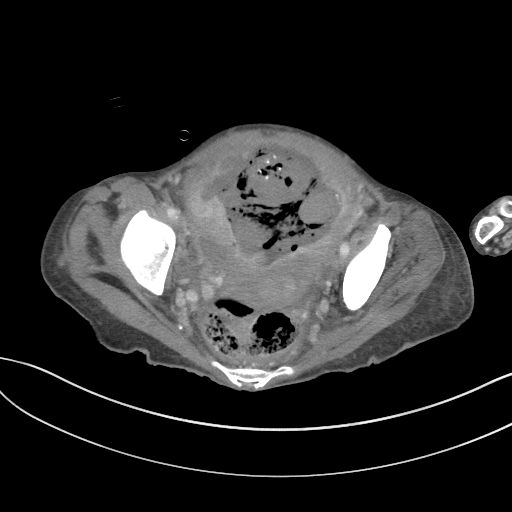
[im 39/105  soft-tissue]
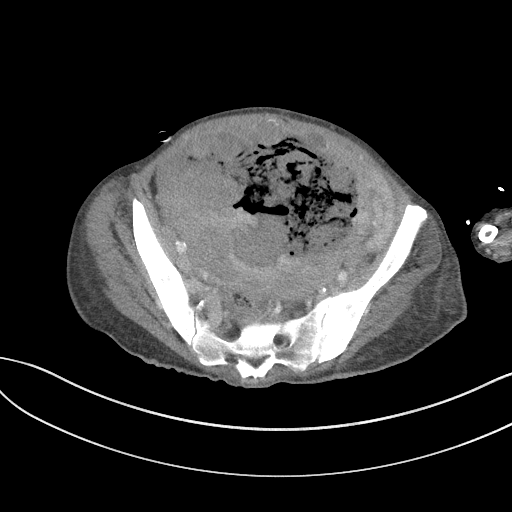
[im 50/105  soft-tissue]
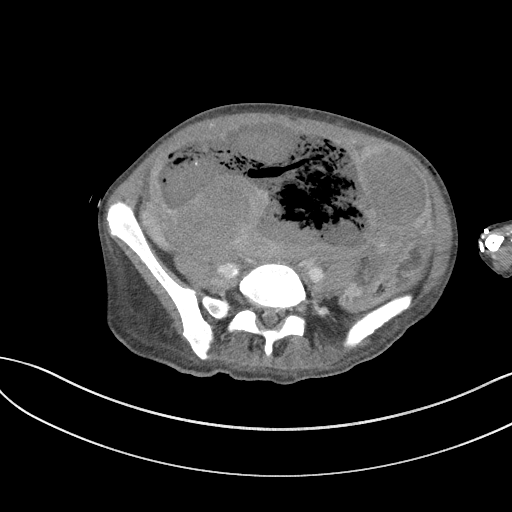
[im 55/105  soft-tissue]
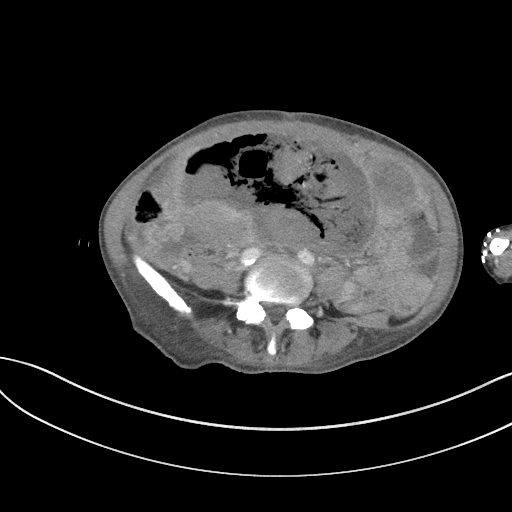
[im 66/105  soft-tissue]
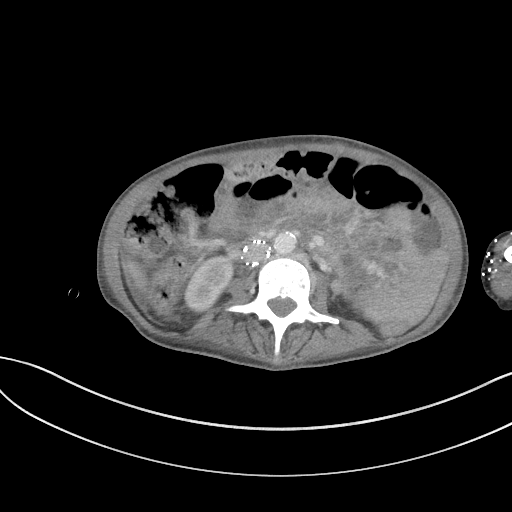
[im 72/105  soft-tissue]
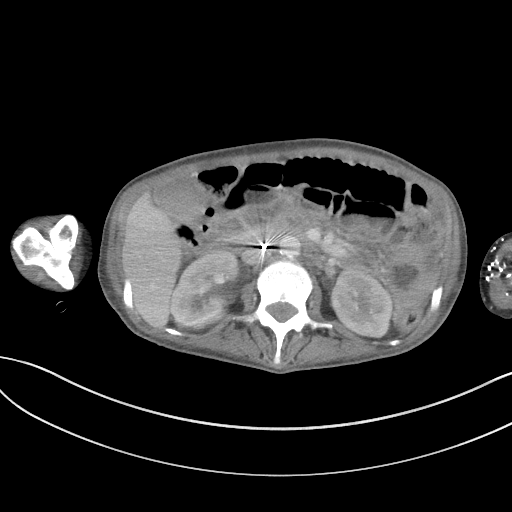
[im 72/105  bone]
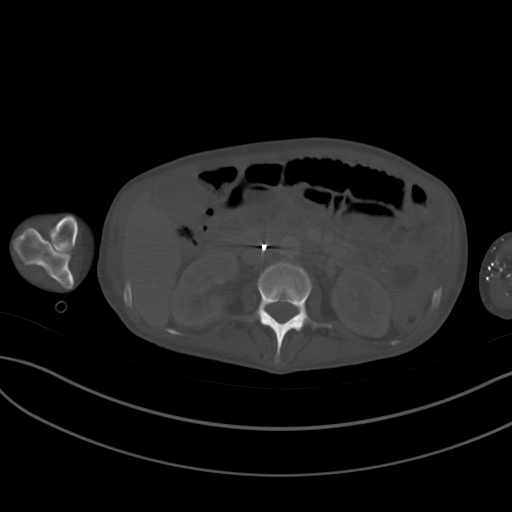
[im 83/105  soft-tissue]
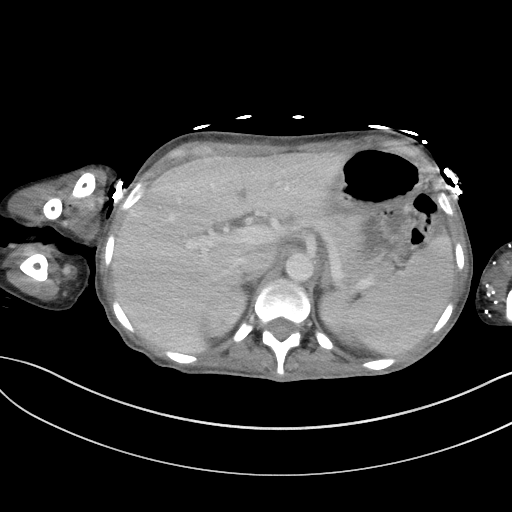
[im 88/105  soft-tissue]
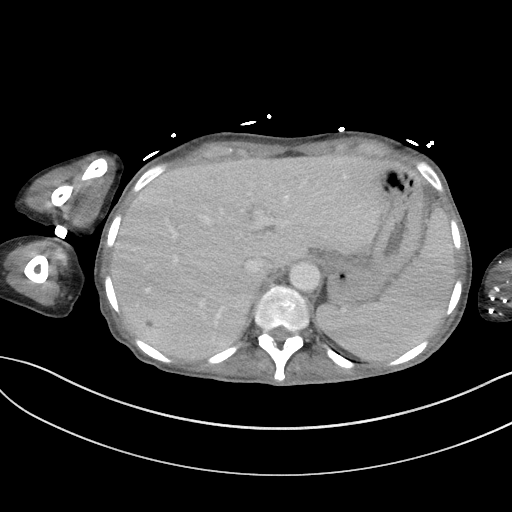
[im 99/105  soft-tissue]
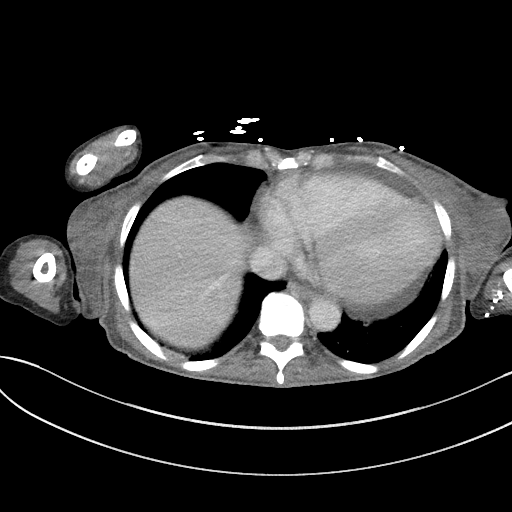

[Series 6: a/p w/ cor · coronal · 0.73mm/px · 3 of 150 slices shown]
[im 50/150  soft-tissue]
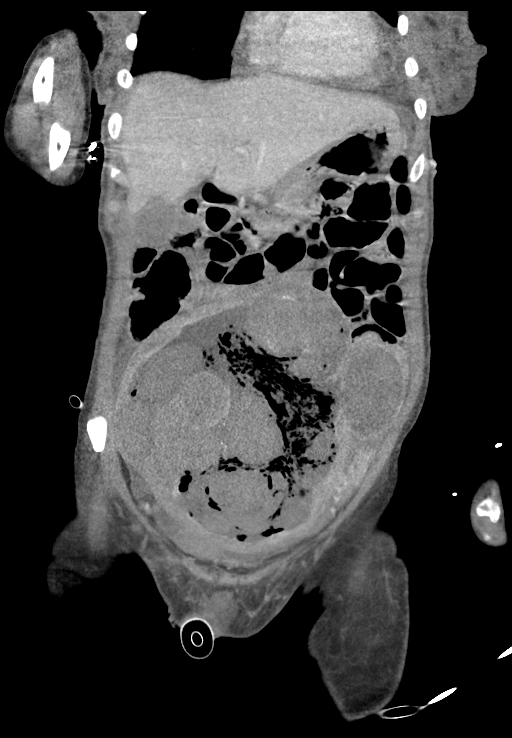
[im 67/150  soft-tissue]
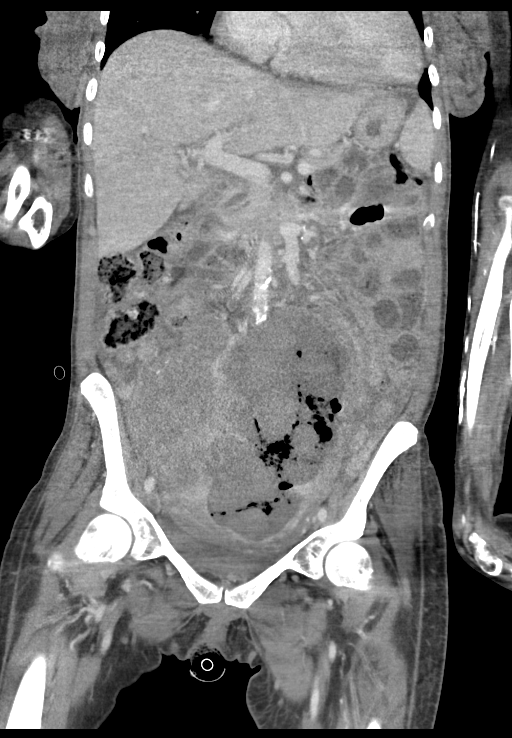
[im 83/150  soft-tissue]
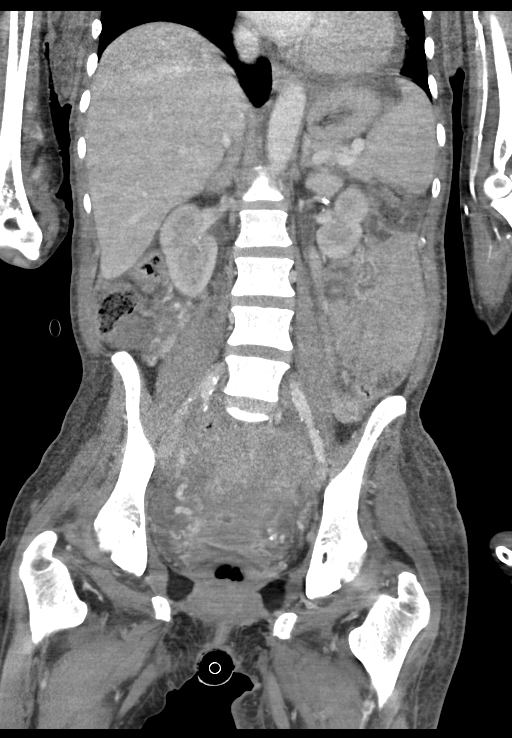

[15 of 46 positions shown; findings below may reference images not displayed]

FINDINGS: Lower Chest: No acute findings.  Stable cardiomegaly.

Hepatobiliary: No hepatic masses identified. Stable sub-cm cyst seen
in posterior right hepatic lobe. Gallbladder is unremarkable. No
evidence of biliary ductal dilatation.

Pancreas:  No mass or inflammatory changes.

Spleen: Within normal limits in size and appearance.

Adrenals/Urinary Tract: No masses identified. Mild chronic left
renal parenchymal scarring again noted. A few tiny 1-2 mm right
renal calculi are again seen. No evidence of ureteral calculi or
hydronephrosis. Urinary bladder is empty.

Stomach/Bowel: No evidence of obstruction, inflammatory process or
abnormal fluid collections.

Vascular/Lymphatic: No pathologically enlarged lymph nodes. No acute
vascular findings. IVC filter is seen in appropriate position.
Aortic atherosclerotic calcification noted.

Reproductive: Markedly enlarged uterus is again seen with multiple
small intramural fibroids. There is marked distension of the
endometrial cavity of the uterus, which contains multiple masslike
soft tissue densities, and a large amount of soft tissue gas which
is new since previous study. Differential diagnosis includes
endometrial carcinoma with necrosis, or large infarcted
intracavitary fibroid.

Other: Diffuse mesenteric and body wall edema. No evidence of
ascites.

Musculoskeletal:  No suspicious bone lesions identified.
IMPRESSION: Markedly enlarged uterus with multiple intramural fibroids.

Marked distension of endometrial cavitywhich contains multiple
masslike soft tissue densities, and a large amount of soft tissue
gas which is new since prior exam. Differential diagnosis includes
endometrial carcinoma with necrosis, or large infarcted
intracavitary fibroid. Recommend GYN consultation.

Diffuse mesenteric and body wall edema, without evidence of ascites.

Tiny nonobstructing right renal calculi. No evidence of ureteral
calculi or hydronephrosis.

Aortic Atherosclerosis ([1C]-[1C]).

## 2021-12-07 MED ORDER — IOHEXOL 350 MG/ML SOLN
75.0000 mL | Freq: Once | INTRAVENOUS | Status: AC | PRN
  Administered 2021-12-07: 75 mL via INTRAVENOUS

## 2021-12-07 MED ORDER — LORAZEPAM 2 MG/ML IJ SOLN
0.5000 mg | Freq: Once | INTRAMUSCULAR | Status: AC
  Administered 2021-12-07: 0.5 mg via INTRAVENOUS
  Filled 2021-12-07: qty 1

## 2021-12-07 MED ORDER — LACTATED RINGERS IV BOLUS
500.0000 mL | Freq: Once | INTRAVENOUS | Status: AC
  Administered 2021-12-07: 500 mL via INTRAVENOUS

## 2021-12-07 MED ORDER — LACTATED RINGERS IV SOLN: INTRAVENOUS | Status: DC

## 2021-12-07 NOTE — Progress Notes (Signed)
Speech Language Pathology Treatment: Dysphagia  Patient Details Name: Holly Bradley MRN: [id number] DOB: Oct 08, 1976 Today's Date: 12/07/2021 Time: 6144-3154 SLP Time Calculation (min) (ACUTE ONLY): 15 min  Assessment / Plan / Recommendation Clinical Impression  Pt was seen during part of breakfast for dysphagia treatment. She was alert and cooperative with increased verbal output which had reduced speech intelligibility due to baseline dysarthria. She tolerated puree, nectar thick liquids liquids, and thin liquids via tsp without overt s/sx of aspiration. Pt demonstrated inconsistent delayed coughing with thin liquids via cup and straw. Bolus manipulation still appears prolonged, but was slightly more functional than when she was last seen. A modified barium swallow study is recommended to assess potential for diet advancement and pt verbalized agreement.    HPI HPI: Pt is a 45 y/o female admitted 12/03/21 with AMS. MRI brain was negative. EEG 12/13: generalized periodic discharges with triphasic morphology which can be on the ictal-interictal continuum. Hx of right MCA CVA 04/2021 with thrombectomy, multiple intubations, trach and decannulation, D/Cd to [facility] 7/22. Several MBS studies - the last was 06/21/21, which revealed mild dysphagia with oral residue, occasional trace aspiration of thin liquids with strong cough response. Dys3/thin liquids was recommended at D/C. Pt was last seen by SLP 11/22/21 and a dysphagia 1 diet with nectar thick liquids was recommended at that time. PMH: DVT, HTN, HF with recovered EF, paroxsymal SVT, uterine fibroids.      SLP Plan  Continue with current plan of care;MBS      Recommendations for follow up therapy are one component of a multi-disciplinary discharge planning process, led by the attending physician.  Recommendations may be updated based on patient status, additional functional criteria and insurance authorization.    Recommendations  Diet  recommendations: Dysphagia 1 (puree);Nectar-thick liquid Liquids provided via: Cup;Straw Medication Administration: Crushed with puree Supervision: Trained caregiver to feed patient;Full supervision/cueing for compensatory strategies Compensations: Minimize environmental distractions;Slow rate;Small sips/bites;Follow solids with liquid Postural Changes and/or Swallow Maneuvers: Seated upright 90 degrees                Oral Care Recommendations: Oral care BID;Staff/trained caregiver to provide oral care Follow Up Recommendations: Skilled nursing-short term rehab (<3 hours/day) Assistance recommended at discharge: Frequent or constant Supervision/Assistance SLP Visit Diagnosis: Dysphagia, unspecified (R13.10) Plan: Continue with current plan of care;MBS         Holly Bradley I. Holly Bradley, Holly Bradley, Holly Bradley Office number [phone] Pager [place]  12/07/2021, 9:39 AM

## 2021-12-07 NOTE — Progress Notes (Signed)
Modified Barium Swallow Progress Note  Patient Details  Name: Holly Bradley MRN: [id number] Date of Birth: [birth year]  Today's Date: 12/07/2021  Modified Barium Swallow completed.  Full report located under Chart Review in the Imaging Section.  Brief recommendations include the following:  Clinical Impression  Pt was alert and communicated verbally when she was seen this morning. However, she was lethargic throughout the evaluation and she required frequent thermal and tactile simulation via cold, wet washcloth to facilitate continued participate and to maintain an adequate level of alertness. Pt presents with impaired bolus cohesion, impaired mastication, weak bolus manipulation, and a pharyngeal delay. She demonstrated premature spillage to the valleculae and pyriform sinuses. She exhibited prolonged and ineffective mastication with dysphagia 3 solids, with moderate oral residue which necessitated oral suctioning. Aspiration was noted with thin liquids via cup secondary to the pharyngeal delay. Sensation of aspiration was notably delayed, but appeared to be effective expulsion of trace aspirate. Pt could not be challenged adequately with thin liquids due to her lethargy, and the impact of lethargy on her overall presentation is strongly considered. Pt's current diet of dysphagia 1 solids and nectar thick liquids will be continued, and SLP will continue to follow pt.   Swallow Evaluation Recommendations       SLP Diet Recommendations: Dysphagia 1 (Puree) solids;Nectar thick liquid   Liquid Administration via: Cup;Straw   Medication Administration: Crushed with puree (small pills may be given whole with puree)   Supervision: Staff to assist with self feeding   Compensations: Minimize environmental distractions;Slow rate;Small sips/bites;Follow solids with liquid   Postural Changes: Seated upright at 90 degrees   Oral Care Recommendations: Oral care BID   Other Recommendations:  Order thickener from pharmacy;Have oral suction available   Mardi Cannady I. Hardin Negus, [place], [place] Office number [phone] Pager [place] 12/07/2021,2:39 PM

## 2021-12-07 NOTE — Progress Notes (Addendum)
Subjective: Patient was seen at bedside during rounds this morning. She is able to talk some today. She continues to complain of lower abdominal/pelvic pain. No other complains or concerns at this time.   Objective:  Vital signs in last 24 hours: Vitals:   12/06/21 1211 12/06/21 1619 12/06/21 2000 12/07/21 0000  BP: 114/67 113/70 127/73 121/72  Pulse: 80 83 86 88  Resp: 16 14 16 15   Temp: 98.4 F (36.9 C) 98.3 F (36.8 C) 100 F (37.8 C) 97.6 F (36.4 C)  TempSrc: Oral Oral Oral Oral  SpO2: 100% 99% 99% 98%   General Physical Exam: Constitutional: alert, well-appearing, in no acute distress. Minimally verbal. HENT: normocephalic, atraumatic, moist mucous membranes Eyes: conjunctiva non-erythematous, extraocular movements intact Neck: supple Cardiovascular: regular rate and rhythm, no m/r/g Pulmonary/Chest: normal work of breathing on room air, Lungs CTA.  Abdominal: soft, lower abdomen diffusely tender to palpation, non-distended. Nodular abdomen due to fibroids MSK: normal bulk and tone Neurological: alert, unable to evaluate orientation due to nonverbal status. Skin: warm and dry Psych: normal behavior, blunted affect   Assessment/Plan:  Principal Problem:   Acute encephalopathy Active Problems:   AKI (acute kidney injury) ([place])   Hypertension   Pressure injury of skin   Hypercalcemia  Pt is a 45 year old female with pertinent history of CVA in 2022 residual left sided hemiplegia, history of DVT, hypertension, heart failure with recovered EF, paroxysmal SVT, uterine fibroids status post uterine embolization admitted for acute encephalopathy likely 2/2 dehydration vs hypercalcemia vs infection.    Acute encephalopathy likely secondary to hypercalcemia vs infection Pt is afebrile, has an isolated elevation in WBC count of 22.2, continues to complain of lower abdominal pain/pelvic pain and has foul smelling, dark vaginal discharge. Pt had a decrease in Hgb to 6.7  yesterday and was given one unit of PRBC. Hb increased to 7.6 after PRBC transfusion but is trending down this morning to 7.4. Will obtain another CBC to monitor for any further bleeding. In the setting of a recent uterine artery embolization for fibroids and continued dark, foul smelling vaginal discharge, plan is to obtain CT abd/pelvis to evaluate for a source of bleeding and infection. The etiology behind the patient's altered mental status and abdominal pain remains uncertain at this time.  It is possible that she has an underlying infection after her recent uterine artery embolization which could be the etiology.  Patient was also noted to have an elevated calcium level up to 12.3.  Hypercalcemia at these levels can also cause abdominal pain and altered mental status as well. -PTH levels are low normal at 14 which is an inadequate suppression of PTH in the setting of significant hypercalcemia. Plan is to obtain PTH related protein levels and vitamin D levels or further evaluation. Will start the patient on IVF and monitor calcium levels closely. -Await CT results. -Will obtain PTHrP and vitamin D levels -Recheck CBC and BMP -Pt/OT following, appreciate recs -Given LR 500 cc bolus. -Continue LR 150 cc/hr for 12 hours.  Normocytic blood loss anemia Nursing staff noted that pt has been having dark, foul smelling vaginal discharge, tea colored urine and she is noted to have a decrease in her Hb on 12/15 which required 1 unit of PRBC transfusion. She continues to have decreasing Hb levels with latest level of 7.4 -transfuse if Hb <7 -recheck Hb levels on CBC  Mild Hypokalemia-resolved Pt's potassium levels adequately repleted with IV supplements from 3.3 yesterday to 3.9 today. -  10 mEq of Kcl x3 given via IV.   -Will recheck BMP in the AM  Prolonged QT-resolved On 12/14, pt had prolonged QT of 550 to 600. An EKG showed NSR with corrected QT of 503. While at bedside, pt was noted to have a Qtc of  550. -Discontinuing lexapro 20 mg and amiodarone 200 mg which are QT prolonging agents -EKG showed NSR with QTc of 480  -monitoring pt on telemetry for any changes.  Acute kidney injury likely 2/2 dehydration-improving Serum creatinine is decreasing to 1.37 from 1.42 yesterday (baseline of ~0.7).  -Recheck BMP in the AM  Palliative Care Consult Palliative care talked to pt and her brother. Pt consented to DNR/DNI status and brother is made aware. Brother conveys that he would like for the pt to receive continued medical evaluation and care at this time. He is also open to [place] discussions at a later time if needed.    Hx LLE DVT Patient previously on Eliquis which was discontinued in the setting of recent uterine bleed (October 2022).  Patient is status post uterine artery embolization.  An IVC filter was placed to prevent PE.  Follow-up for removal in 3 to 6 months. 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  On exam, 0 out of 5 strength of the left side. 3 out of 5 strength of the right upper extremity.  This appears to be patient's baseline since recent stroke.  No new weakness noted.  Patient was nonverbal during exam, however responsive to verbal stimuli with eye contact and follows simple commands.   -Head MRI and CT are negative.   Chronic Problems:  Hypertension Pt's SBPs are elevated at 170-190s today.  -Will restart Clonidine 0.1  TID, 5 mg IV hydralazine Q4H for SBP >180 and Carvedilol 25 mg BID     Hyperlipidemia Pt has a history HLD and takes atorvastatin 40 mg QD. -Will restart Atorvastatin 40 mg QD   HF with recovered EF Recent echo in May 2022 revealed EF of 55-60% with grade II diastolic dysfunction and severe left atrial enlargement.  Vitals are stable; blood pressure acceptable. -Holding torsemide 20 mg twice daily until hypercalcemia resolves and will  consider restarting if pt's symptoms resolve.  -restarting Imdur 60 mg QD, ASA 81 mg QD and Carvedilol 25 mg BID    Paroxysmal SVT EKG is sinus rhythm with no new changes compared to previous tracings.  Home medication includes amiodarone 200 mg daily. -discontinued amiodarone 200 mg QD  Continue following home medications: Protonix 40 mg QD   Best Practice: Diet:  dysphagia 1 IVF: NS  VTE: Heparin Code: Full  Signature: Claudean Kinds, [facility]  Pager: [phone] 6:28 AM, 12/07/2021    Claudean Kinds, Medical Student 12/07/2021, 6:28 AM

## 2021-12-07 NOTE — Hospital Course (Addendum)
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   ° °Acute encephalopathy likely secondary to hypercalcemia vs infection °Pt initially presented to the hospital due to altered mental status noted by staff in [place]. In the ED, pt had leukocytosis of 17.6, hb of 8, and low grade fever of 100.4. On further workup, she had an unremarkable lactic acid, UA, CXR, heat CT and MRI. Pt met SIRs criteria but without a source of infection and end organ dysfunction, did not meet sepsis criteria. On 12/15, pt was noted to have foul smelling dark vaginal discharge and tea colored urine. We obtained a cervicovaginal swab for STI testing which came back negative for gonorrhea, candida, chlamydia, trichomonas, and gardnerella. A CT abdomen/pelvis on 12/16 revealed ________.  In addition, pt's hydration status was also considered for workup of AMS. It is noted that older patients with dehydration are likely to experience symptoms of delirium, confusion, and decreased activity in patients with dehydration. Pt was given 500 cc bolus of LR with improvement in hydration status and improving mentation in pt. Furthermore pt's calcium levels are moderately elevated at ~12 with PTH of 14 which is inappropriately suppressed. Elevated calcium levels are known to exhibit abdominal pain and AMS. Held torsemide during this admission. Obtained PTHrP and Vitamin D levels which were ______. Administered 500 cc bolus and maintenance fluid of 150 cc/hr for 12 hours. ° ° °Mild Hypokalemia  °Pt's potassium levels are 3.3 today which decreased from 4.8 yesterday.  °-10 mEq of Kcl x3 given via IV.   °-Will recheck BMP in the AM ° °Prolonged QT °On 12/14, pt had  prolonged QT of 550 to 600. An EKG showed NSR with corrected QT of 503. While at bedside, pt was noted to have a Qtc of 550. °-Discontinuing lexapro 20 mg and amiodarone 200 mg which are QT prolonging agents °-EKG showed NSR with QTc of 480  °-monitoring pt on telemetry for any changes. ° °Acute kidney injury likely 2/2 dehydration °On admission, pt noted to have serum creatinine of elevated creatinine at 2.5 and BUN 96. Hydration improved creatinine over time.  ° ° °Hx LLE DVT °Patient previously on Eliquis which was discontinued in the setting of recent uterine bleed (October 2022).  Patient is status post uterine artery embolization.  An IVC filter was placed to prevent PE.  Follow-up for removal in 3 to 6 months. 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  On exam, 0 out of 5 strength of the left side. 3 out of 5 strength of the right upper extremity.  This appears to be patient's baseline since recent stroke.  No new weakness noted.  Patient was nonverbal during exam, however responsive to verbal stimuli with eye contact and follows simple commands.   °-Head MRI and CT are negative. ° ° °

## 2021-12-08 ENCOUNTER — Telehealth: Payer: Self-pay | Admitting: Obstetrics and Gynecology

## 2021-12-08 ENCOUNTER — Encounter (HOSPITAL_COMMUNITY): Payer: Self-pay | Admitting: Internal Medicine

## 2021-12-08 DIAGNOSIS — N719 Inflammatory disease of uterus, unspecified: Secondary | ICD-10-CM

## 2021-12-08 LAB — CBC WITH DIFFERENTIAL/PLATELET
Abs Immature Granulocytes: 0.29 10*3/uL — ABNORMAL HIGH (ref 0.00–0.07)
Basophils Absolute: 0 10*3/uL (ref 0.0–0.1)
Basophils Relative: 0 %
Eosinophils Absolute: 0 10*3/uL (ref 0.0–0.5)
Eosinophils Relative: 0 %
HCT: 28 % — ABNORMAL LOW (ref 36.0–46.0)
Hemoglobin: 9.1 g/dL — ABNORMAL LOW (ref 12.0–15.0)
Immature Granulocytes: 1 %
Lymphocytes Relative: 3 %
Lymphs Abs: 0.7 10*3/uL (ref 0.7–4.0)
MCH: 26.8 pg (ref 26.0–34.0)
MCHC: 32.5 g/dL (ref 30.0–36.0)
MCV: 82.4 fL (ref 80.0–100.0)
Monocytes Absolute: 0.6 10*3/uL (ref 0.1–1.0)
Monocytes Relative: 2 %
Neutro Abs: 24.8 10*3/uL — ABNORMAL HIGH (ref 1.7–7.7)
Neutrophils Relative %: 94 %
Platelets: 430 10*3/uL — ABNORMAL HIGH (ref 150–400)
RBC: 3.4 MIL/uL — ABNORMAL LOW (ref 3.87–5.11)
RDW: 23.4 % — ABNORMAL HIGH (ref 11.5–15.5)
WBC: 26.4 10*3/uL — ABNORMAL HIGH (ref 4.0–10.5)
nRBC: 0 % (ref 0.0–0.2)

## 2021-12-08 LAB — CULTURE, BLOOD (ROUTINE X 2): Culture: NO GROWTH

## 2021-12-08 LAB — BASIC METABOLIC PANEL
Anion gap: 12 (ref 5–15)
BUN: 61 mg/dL — ABNORMAL HIGH (ref 6–20)
CO2: 24 mmol/L (ref 22–32)
Calcium: 11.4 mg/dL — ABNORMAL HIGH (ref 8.9–10.3)
Chloride: 109 mmol/L (ref 98–111)
Creatinine, Ser: 1.17 mg/dL — ABNORMAL HIGH (ref 0.44–1.00)
GFR, Estimated: 58 mL/min — ABNORMAL LOW (ref >60)
Glucose, Bld: 97 mg/dL (ref 70–99)
Potassium: 3.5 mmol/L (ref 3.5–5.1)
Sodium: 145 mmol/L (ref 135–145)

## 2021-12-08 MED ORDER — PIPERACILLIN-TAZOBACTAM 3.375 G IVPB
3.3750 g | Freq: Three times a day (TID) | INTRAVENOUS | Status: DC
  Administered 2021-12-08 – 2021-12-12 (×13): 3.375 g via INTRAVENOUS
  Filled 2021-12-08 (×17): qty 50

## 2021-12-08 MED ORDER — LACTATED RINGERS IV SOLN: INTRAVENOUS | Status: AC

## 2021-12-08 MED ORDER — PIPERACILLIN-TAZOBACTAM 3.375 G IVPB
3.3750 g | Freq: Three times a day (TID) | INTRAVENOUS | Status: DC
  Filled 2021-12-08: qty 50

## 2021-12-08 NOTE — Consult Note (Signed)
Gynecology Inpatient Consult Note  Date of Consult: 12/08/2021   Requesting Provider: Internal Medicine Service  Primary OBGYN: [facility] Primary Care Provider: Mitzi Hansen  Reason for Consult: concern for endometritis  History of Present Illness: Holly Bradley is a 45 y.o. G0 (LMP: unknown), with the above CC. PMHx is significant for HTN, SVT, right MCA stroke, fibroids with heavy menstrual bleeding s/p 10-25 [place].  Patient had a 10/25 [place] and right ovary embolization by IR due to heavy menstrual bleeding, Hgb of 2.9 and IVC filter placement due to being on eliquis due to recent MCA stroke from occlusion.  She did well s/p this procedure and was discharged to her LTC facility. Most recently, the patient was admitted on 12/12 for working dx of acute encephalopathy with also AKI, hypercalcemia, low grade fever and a WBC of 17.  She was admitted to the IM service and she continued to improve except for persistent low grade fevers and gradually rising WBC and foul smelling vaginal discharge on 12/14. She had a swab done on 12/14 which was negative for gc/ct/trich/bv and yeast and had a CT A/P with contrast done on 12/16 with results below.   GYN consulted this morning and patient had zosyn ordered today.  Patient states abdominal pain is stable. She is currently eating her breakfast and is in NAD. No VB during her hospitalization  ROS: A 12-point review of systems was performed and negative, except as stated in the above HPI.  OBGYN History: As per HPI. OB History  Gravida Para Term Preterm AB Living  0 0 0 0 0 0  SAB IAB Ectopic Multiple Live Births  0 0 0 0 0    History of pap smears: unknown  Past Medical History: Past Medical History:  Diagnosis Date   CHF (congestive heart failure) (HCC)    Chronic blood loss anemia    Related to uterine fibroids   Essential hypertension    Poorly controlled, repeat by report only on nicardipine 90 mg    Fibroid    Likely complicated by by menometrorrhagia and chronic anemia   Hypertension    Paroxysmal SVT (supraventricular tachycardia) (HCC)    Frequent episodes, can last anywhere from 20 minutes to 12-15 hours.  Not on beta-blocker or calcium channel blocker   Uterine fibroid     Past Surgical History: Past Surgical History:  Procedure Laterality Date   IR ANGIOGRAM FOLLOW UP STUDY  05/15/2021   IR AORTAGRAM ABDOMINAL SERIALOGRAM  10/16/2021   IR CT HEAD LTD  05/15/2021   IR EMBO ARTERIAL NOT HEMORR HEMANG INC GUIDE ROADMAPPING  10/16/2021   IR PERCUTANEOUS ART THROMBECTOMY/INFUSION INTRACRANIAL INC DIAG ANGIO  05/15/2021       IR PERCUTANEOUS ART THROMBECTOMY/INFUSION INTRACRANIAL INC DIAG ANGIO  05/15/2021   IR TRANSCATH/EMBOLIZ  05/15/2021   IR US GUIDE VASC ACCESS RIGHT  10/16/2021   IVC FILTER INSERTION N/A 10/24/2021   Procedure: IVC FILTER INSERTION;  Surgeon: Serafina Mitchell, MD;  Location: [place] [facility];  Service: Cardiovascular;  Laterality: N/A;   RADIOLOGY WITH ANESTHESIA N/A 05/15/2021   Procedure: IR WITH ANESTHESIA;  Surgeon: Radiologist, Medication, MD;  Location: [place];  Service: Radiology;  Laterality: N/A;   Unknown      Family History:  Family History  Problem Relation Age of Onset   Dementia Mother     Social History:  Social History   Socioeconomic History   Marital status: Single    Spouse  name: Not on file   Number of children: 0   Years of education: Not on file   Highest education level: Not on file  Occupational History   Not on file  Tobacco Use   Smoking status: Every Day   Smokeless tobacco: Never  Vaping Use   Vaping Use: Never used  Substance and Sexual Activity   Alcohol use: Not Currently    Comment: Occasionally   Drug use: Not Currently    Types: Marijuana   Sexual activity: Not on file  Other Topics Concern   Not on file  Social History Narrative   ** Merged History Encounter **       Social Determinants of Health    Financial Resource Strain: High Risk   Difficulty of Paying Living Expenses: Very hard  Food Insecurity: No Food Insecurity   Worried About Charity fundraiser in the Last Year: Never true   Ran Out of Food in the Last Year: Never true  Transportation Needs: No Transportation Needs   Lack of Transportation (Medical): No   Lack of Transportation (Non-Medical): No  Physical Activity: Not on file  Stress: Not on file  Social Connections: Not on file  Intimate Partner Violence: Not on file    Allergy: No Known Allergies  Current Outpatient Medications: Medications Prior to Admission  Medication Sig Dispense Refill Last Dose   acetaminophen (TYLENOL) 500 MG tablet Take 2 tablets (1,000 mg total) by mouth every 6 (six) hours. (Patient taking differently: Take 1,000 mg by mouth every 6 (six) hours. For pain) 30 tablet 0 12/03/2021   amiodarone (PACERONE) 200 MG tablet Take 200 mg by mouth daily.   12/03/2021   aspirin 81 MG chewable tablet Chew 1 tablet (81 mg total) by mouth daily.   12/03/2021   atorvastatin (LIPITOR) 40 MG tablet Take 1 tablet (40 mg total) by mouth daily. (Patient taking differently: Take 40 mg by mouth at bedtime.)   12/03/2021   baclofen (LIORESAL) 20 MG tablet Take 1 tablet (20 mg total) by mouth 3 (three) times daily. 30 each 0 12/03/2021   carvedilol (COREG) 25 MG tablet Take 1 tablet (25 mg total) by mouth 2 (two) times daily with a meal.   12/02/2021 at 1200   cloNIDine (CATAPRES) 0.1 MG tablet Take 1 tablet (0.1 mg total) by mouth 3 (three) times daily. 60 tablet 11 12/03/2021   escitalopram (LEXAPRO) 20 MG tablet Take 20 mg by mouth daily.   12/03/2021   feeding supplement (ENSURE ENLIVE / ENSURE PLUS) LIQD Take 237 mLs by mouth 3 (three) times daily between meals. 237 mL 12 12/03/2021   ferrous sulfate 325 (65 FE) MG tablet Take 1 tablet (325 mg total) by mouth 2 (two) times daily with a meal. 60 tablet 3 12/03/2021   hydrALAZINE (APRESOLINE) 100 MG tablet  Take 1 tablet (100 mg total) by mouth 3 (three) times daily. 90 tablet 3 12/03/2021   isosorbide mononitrate (IMDUR) 60 MG 24 hr tablet Take 60 mg by mouth daily.   12/03/2021   lactulose (CHRONULAC) 10 GM/15ML solution Take 15 mLs (10 g total) by mouth 2 (two) times daily. 236 mL 0 12/03/2021   linaclotide (LINZESS) 145 MCG CAPS capsule Take 145 mcg by mouth daily before breakfast.   12/03/2021   mirtazapine (REMERON) 7.5 MG tablet Take 7.5 mg by mouth at bedtime.   12/02/2021   pantoprazole (PROTONIX) 40 MG tablet Take 40 mg by mouth daily.   Past Month  polyethylene glycol (MIRALAX / GLYCOLAX) 17 g packet Take 17 g by mouth daily. 14 each 0 12/03/2021   sacubitril-valsartan (ENTRESTO) 97-103 MG Take 1 tablet by mouth 2 (two) times daily. 60 tablet 3 12/03/2021   senna-docusate (SENOKOT-S) 8.6-50 MG tablet Take 2 tablets by mouth 2 (two) times daily.   12/03/2021   simethicone (MYLICON) 40 QP/6.1PJ drops Take 0.6 mLs (40 mg total) by mouth every 6 (six) hours as needed for flatulence. 30 mL 0 unk   torsemide (DEMADEX) 20 MG tablet TAKE 2 TABLETS (40 MG TOTAL) BY MOUTH 2 (TWO) TIMES DAILY. (Patient taking differently: Take 40 mg by mouth 2 (two) times daily.) 120 tablet 2 12/03/2021   Amino Acids-Protein Hydrolys (FEEDING SUPPLEMENT, PRO-STAT SUGAR FREE 64,) LIQD Take 30 mLs by mouth in the morning and at bedtime.      bisacodyl (DULCOLAX) 10 MG suppository Place 10 mg rectally See admin instructions. As needed for constipation x 1 dose if no relief from milk of mag      Multiple Vitamin (MULTIVITAMIN WITH MINERALS) TABS tablet Take 1 tablet by mouth daily. (Patient not taking: Reported on 12/03/2021)   Not Taking   Sodium Phosphates (RA SALINE ENEMA RE) Place 1 enema rectally See admin instructions. As needed for constipation if no relief from bisacodyl suppository (Patient not taking: Reported on 12/03/2021)   Not Taking     Hospital Medications: Current Facility-Administered Medications   Medication Dose Route Frequency Provider Last Rate Last Admin   acetaminophen (TYLENOL) tablet 650 mg  650 mg Oral Q6H PRN Rehman, Areeg N, DO   650 mg at 12/08/21 0757   Or   acetaminophen (TYLENOL) suppository 650 mg  650 mg Rectal Q6H PRN Rehman, Areeg N, DO       aspirin chewable tablet 81 mg  81 mg Oral Daily Virl Axe, MD   81 mg at 12/08/21 0803   atorvastatin (LIPITOR) tablet 40 mg  40 mg Oral Daily Virl Axe, MD   40 mg at 12/08/21 0803   carvedilol (COREG) tablet 25 mg  25 mg Oral BID WC Virl Axe, MD   25 mg at 12/08/21 0757   cloNIDine (CATAPRES) tablet 0.1 mg  0.1 mg Oral TID Virl Axe, MD   0.1 mg at 12/08/21 0803   heparin injection 5,000 Units  5,000 Units Subcutaneous Q8H Rehman, Areeg N, DO   5,000 Units at 12/08/21 0547   hydrALAZINE (APRESOLINE) injection 5 mg  5 mg Intravenous Q4H PRN Virl Axe, MD       hydrALAZINE (APRESOLINE) tablet 100 mg  100 mg Oral TID Virl Axe, MD   100 mg at 12/08/21 0932   isosorbide mononitrate (IMDUR) 24 hr tablet 60 mg  60 mg Oral Daily Virl Axe, MD   60 mg at 12/08/21 6712   lactated ringers infusion   Intravenous Continuous Wayland Denis, MD       MEDLINE mouth rinse  15 mL Mouth Rinse BID Joni Reining C, DO   15 mL at 12/08/21 0804   pantoprazole (PROTONIX) EC tablet 40 mg  40 mg Oral Daily Virl Axe, MD   40 mg at 12/08/21 0803   piperacillin-tazobactam (ZOSYN) IVPB 3.375 g  3.375 g Intravenous Q8H Aldine Contes, MD       senna-docusate (Senokot-S) tablet 2 tablet  2 tablet Oral BID Virl Axe, MD   2 tablet at 12/08/21 0803     Physical Exam:  Current Vital Signs 24h Vital Sign Ranges  T 98.8  F (37.1 C) Temp  Avg: 99.9 F (37.7 C)  Min: 98.8 F (37.1 C)  Max: 101.1 F (38.4 C)  BP (!) 141/85  BP  Min: 112/74  Max: 149/77  HR 86 Pulse  Avg: 92.1  Min: 86  Max: 100  RR 19 Resp  Avg: 18.6  Min: 17  Max: 20  SaO2 90 % Room Air SpO2  Avg: 97 %  Min: 90 %  Max: 100 %       24 Hour  I/O Current Shift I/O  Time Ins Outs 12/16 0701 - 12/17 0700 In: 1675.5 [P.O.:120; I.V.:1555.5] Out: -  12/17 0701 - 12/17 1900 In: -  Out: 400 [Urine:400]   Patient Vitals for the past 24 hrs:  BP Temp Temp src Pulse Resp SpO2  12/08/21 0736 (!) 141/85 98.8 F (37.1 C) Oral 86 19 90 %  12/08/21 0509 (!) 149/77 98.9 F (37.2 C) Oral 86 17 93 %  12/07/21 2330 (!) 144/81 99.6 F (37.6 C) Oral 90 18 98 %  12/07/21 2100 112/74 (!) 100.6 F (38.1 C) Oral 96 20 99 %  12/07/21 1934 126/68 (!) 101.1 F (38.4 C) Oral 100 19 99 %  12/07/21 1600 139/74 (!) 100.9 F (38.3 C) Axillary 95 20 100 %  12/07/21 1300 126/64 99.1 F (37.3 C) Axillary 92 17 100 %    General appearance: no acute distress.  Cardiovascular: S1, S2 normal, no murmur, rub or gallop, regular rate and rhythm Respiratory:  Clear to auscultation bilateral. Normal respiratory effort Abdomen: nttp (pt nods she has some tenderness on palpation), no peritoneal s/s Neuro/Psych:  Normal mood and affect.  Skin:  Warm and dry.  Extremities: no clubbing, cyanosis, or edema.   Laboratory: As per HPI Recent Labs  Lab 12/07/21 0149 12/07/21 1755 12/08/21 0711  WBC 22.2* 25.6* 26.4*  HGB 7.4* 7.5* 9.1*  HCT 22.6* 23.9* 28.0*  PLT 367 404* 430*   Recent Labs  Lab 12/03/21 1606 12/03/21 1638 12/04/21 0328 12/05/21 0145 12/06/21 0118 12/07/21 0149 12/08/21 0711  NA 139   < > 140   < > 145 141 145  K 4.2   < > 3.2*   < > 3.3* 3.9 3.5  CL 102  --  103   < > 111 110 109  CO2 23  --  23   < > 24 23 24   BUN 96*  --  90*   < > 76* 76* 61*  CREATININE 2.50*  --  2.04*   < > 1.40* 1.37* 1.17*  CALCIUM 11.7*  --  11.2*   < > 10.9* 10.9* 11.4*  PROT 9.6*  --  8.6*  --   --  7.5  --   BILITOT 0.5  --  1.1  --   --  1.0  --   ALKPHOS 205*  --  187*  --   --  159*  --   ALT 13  --  12  --   --  11  --   AST 24  --  17  --   --  18  --   GLUCOSE 89  --  105*   < > 121* 112* 97   < > = values in this interval not displayed.     Imaging:  Narrative & Impression  CLINICAL DATA:  Left lower quadrant pain.  Leukocytosis.   EXAM: CT ABDOMEN AND PELVIS WITH CONTRAST   TECHNIQUE: Multidetector CT imaging of the abdomen and  pelvis was performed using the standard protocol following bolus administration of intravenous contrast.   CONTRAST:  23mL OMNIPAQUE IOHEXOL 350 MG/ML SOLN   COMPARISON:  10/21/2021   FINDINGS: Lower Chest: No acute findings.  Stable cardiomegaly.   Hepatobiliary: No hepatic masses identified. Stable sub-cm cyst seen in posterior right hepatic lobe. Gallbladder is unremarkable. No evidence of biliary ductal dilatation.   Pancreas:  No mass or inflammatory changes.   Spleen: Within normal limits in size and appearance.   Adrenals/Urinary Tract: No masses identified. Mild chronic left renal parenchymal scarring again noted. A few tiny 1-2 mm right renal calculi are again seen. No evidence of ureteral calculi or hydronephrosis. Urinary bladder is empty.   Stomach/Bowel: No evidence of obstruction, inflammatory process or abnormal fluid collections.   Vascular/Lymphatic: No pathologically enlarged lymph nodes. No acute vascular findings. IVC filter is seen in appropriate position. Aortic atherosclerotic calcification noted.   Reproductive: Markedly enlarged uterus is again seen with multiple small intramural fibroids. There is marked distension of the endometrial cavity of the uterus, which contains multiple masslike soft tissue densities, and a large amount of soft tissue gas which is new since previous study. Differential diagnosis includes endometrial carcinoma with necrosis, or large infarcted intracavitary fibroid.   Other: Diffuse mesenteric and body wall edema. No evidence of ascites.   Musculoskeletal:  No suspicious bone lesions identified.   IMPRESSION: Markedly enlarged uterus with multiple intramural fibroids.   Marked distension of endometrial cavitywhich  contains multiple masslike soft tissue densities, and a large amount of soft tissue gas which is new since prior exam. Differential diagnosis includes endometrial carcinoma with necrosis, or large infarcted intracavitary fibroid. Recommend GYN consultation.   Diffuse mesenteric and body wall edema, without evidence of ascites.   Tiny nonobstructing right renal calculi. No evidence of ureteral calculi or hydronephrosis.   Aortic Atherosclerosis (ICD10-I70.0).     Electronically Signed   By: Marlaine Hind M.D.   On: 12/07/2021 15:37    Assessment: Holly Bradley is a 45 y.o. with likely late onset [place] endometritis; pt stable   Plan: Agree with plan for zosyn and trend CBCs qday and f/u on pending cultures. Unusual presentation, being so late onset, but she did have substantial fibroid burden. Hope to avoid any surgical intervention.    We will continue to see her daily.    Total time taking care of the patient was 40 minutes, with greater than 50% of the time spent in face to face interaction with the patient.  Durene Romans MD Attending [facility] GYN Consult Phone: [phone] (M-F, 0800-1700) & [phone] (Off hours, weekends, holidays)

## 2021-12-08 NOTE — Progress Notes (Signed)
Pharmacy Antibiotic Note  Holly Bradley is a 45 y.o. female admitted on 12/03/2021 with acute encephalopathy and suspected  intra-abdominal infection .  Pharmacy has been consulted for Zosyn dosing.  WBC trending up, currently at 26.4. Currently afebrile on acetaminophen. SCr 1.17. CrCl ~59 mL/min. 12/16 CT abdomen: "Marked distension of endometrial cavity which contains multiple masslike soft tissue densities, and a large amount of soft tissue gas which is new since prior exam"  Plan: Start Zosyn 3.375g q8h  F/u WBC, imaging, and clinical improvement F/u LOT    Temp (24hrs), Avg:99.9 F (37.7 C), Min:98.8 F (37.1 C), Max:101.1 F (38.4 C)  Recent Labs  Lab 12/03/21 1606 12/04/21 0328 12/05/21 0145 12/05/21 0624 12/06/21 0118 12/06/21 1623 12/07/21 0149 12/07/21 1755 12/08/21 0711  WBC 17.6* 18.7*  --    < > 20.0* 21.5* 22.2* 25.6* 26.4*  CREATININE 2.50* 2.04* 1.42*  --  1.40*  --  1.37*  --  1.17*  LATICACIDVEN 1.5  --   --   --   --   --   --   --   --    < > = values in this interval not displayed.    CrCl cannot be calculated (Unknown ideal weight.).    No Known Allergies  Antimicrobials this admission: Zosyn 12/17>>  Microbiology results: 12/12 BCx: NG final 12/12 UCx: NG final   Thank you for allowing pharmacy to be a part of this patients care.  Donald Pore 12/08/2021 9:14 AM

## 2021-12-08 NOTE — Progress Notes (Signed)
Subjective: No acute events or concerns overnight.  Patient seated in bed with breakfast tray.  Patient answers "better" when asked how she is doing today.  Nods her head when asked if she is having abdominal pain.  Updated patient on concern for infection given fevers, elevating white count.  Drinking milk/juice from meal tray on her own.  Patient asked if there were any plans for procedure today.  Objective:  Vital signs in last 24 hours: Vitals:   12/07/21 2100 12/07/21 2330 12/08/21 0509 12/08/21 0736  BP: 112/74 (!) 144/81 (!) 149/77 (!) 141/85  Pulse: 96 90 86 86  Resp: 20 18 17 19   Temp: (!) 100.6 F (38.1 C) 99.6 F (37.6 C) 98.9 F (37.2 C) 98.8 F (37.1 C)  TempSrc: Oral Oral Oral Oral  SpO2: 99% 98% 93% 90%   General Physical Exam: Constitutional: alert, well-appearing, in no acute distress.   HENT: normocephalic, atraumatic, moist mucous membranes Eyes: conjunctiva non-erythematous, extraocular movements intact Neck: supple Cardiovascular: regular rate and rhythm, no m/r/g Pulmonary/Chest: normal work of breathing on room air, Lungs CTA.  Abdominal: TTP epigastric area and lower abdomen, firm masses felt lower abdomen with mild distention Neurological: Awake, alert, intermittently follows commands.  Intermittently answers questions, short answers/ asks questions, shakes head yes no.  Left heel in Prevalon boot. 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    #Acute infectious process, concern for acute intra-abdominopelvic infection #Acute blood loss anemia Patient presented with leukocytosis, and is now febrile, last fever yesterday 100 point 54F, rising white count to 26.  Endorsing lower abdominal/pelvic pain with report of foul-smelling dark vaginal discharge.  Decreasing hemoglobin requiring 1 unit PRBC, hemoglobin stable today 9.1.  Given bleeding and lower abdominal pain, CT abdomen pelvis was obtained which showed multiple intramural fibroids, multiple masslike soft tissue structures with evidence of soft tissue gas new from prior.  Radiology differential includes endometrial carcinoma with necrosis versus infected intra car serrated fibroid, also noted mesenteric and body wall edema.  We will consult gynecology for further evaluation and management.  Started on IV antibiotics with broad coverage for acute intra-abdominopelvic infection.  Suspect this may be degenerating fibroid which can cause low-grade fever, leukocytosis. Plan: -Gynecology consult, appreciate recommendations -IV Zosyn per pharmacy  -Trend CBC -Transfuse if hemoglobin < 7  #Moderate hypercalcemia Patient noted to be hypercalcemic this admission as well as last admission 3 to 4 weeks ago.  Is having encephalopathy and abdominal pain which may be worsened by hypercalcemia.  S/p half liter LR bolus with maintenance fluids.  Patient does have history of HFpEF, will need to be mindful with fluids.  Corrected calcium today 12.8, increased from the day prior.  PTH low normal, suggesting non-PTH mediated hypercalcemia, which can occur secondary to malignancy, vitamin D intoxication, granulomatous inflammatory process, bone resorption seen in immobilization. Vit D normal.  It is possible PTH is inappropriately low normal and should be even lower given hypercalcemia, in this case  concern for primary hyperparathyroidism.    Plan: -PTH related peptide pending -75 cc/HR LR for 10 hours -Continue to trend corrected calcium  #Acute encephalopathy, resolving Intracranial imaging negative for acute process.  Suspect due to dehydration, acute infectious or inflammatory process see above.  Patient also has hypercalcemia ongoing since previous admission, which may be contributing to encephalopathy.  Has history of severe MCA stroke with poor functional status at baseline. Today patient more awake and alert, answering most questions, asking questions of her own. Plan: -Management of acute infectious process as above -Management of moderate hypercalcemia as above -PT/OT/SLP eval and treat -Dysphagia 1 diet  #Mild Hypokalemia, resolved Potassium low at 3.3 this admission, electrolytes were repleted as needed. Plan: -Continue to monitor electrolytes  #Prolonged QT, resolved #Paroxysmal SVT On 12/14, pt had prolonged QT of 550 to 600. An EKG showed NSR with corrected QT of 503.  Patient has history of paroxysmal SVT for which she takes Amiodarone 200 mg daily.  Also on Lexapro 20 mg daily. Both are QT prolonging agents. Plan: -Holding Amiodarone and Lexapro in the setting of QT prolongation -Telemetry monitoring  #Acute kidney injury likely 2/2 dehydration, improving Serum creatinine 2.04 on admission (baseline of ~0.7).  AKI likely secondary to dehydration, kidney function improving.  Creatinine today 1.17, e GFR 58. Plan: -Trend BMP -Avoid nephrotoxic agents -75 cc/HR LR for 10 hours  #Palliative Care Consult Goals of care discussed with patient and brother. Pt consented to DNR/DNI status. Brother conveys that he would like for his sister to receive continued medical evaluation and care at this time. He is open to [place] discussions at a later time if needed.    #Hx LLE DVT Patient previously on Eliquis which was discontinued in the setting of recent uterine bleed (October 2022).  An IVC filter was placed 10/2021  to prevent PE since patient unable to take anticoagulant. Patient is status post uterine artery embolization.  Needs follow-up for in Feb-March with vascular surgery to discuss resuming anticoagulation versus removing IVC filter, and reassess status of DVT in left leg. 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  Appears to be at neurologic baseline given recent improvement in communication and command following.  Follows with Dr. Leonie Man in stroke clinic. Plan: -Continue aspirin 81 mg daily -Continue atorvastatin 20 mg daily patient was restarted on home blood pressure medications for elevated blood pressures.  #Hypertension Home medications: Carvedilol 25 mg twice daily, clonidine 0.1 mg 3 times daily, hydralazine 100 mg 3 times daily, Imdur 60 mg daily. Patient was restarted on antihypertensives for elevated blood pressures.  BPs appropriate.  Holding Entresto in the setting of AKI. Plan: -Restarted Clonidine 0.1  TID -Restarted Carvedilol 25 mg BID -Restarted Hydralazine 100mg  TID -Restarted Imdur 60mg  daily  -As needed 5 mg IV hydralazine Q4H for SBP >180    #HF with recovered EF Recent echo in May 2022 revealed EF of 55-60% with grade II diastolic dysfunction and severe left atrial enlargement.  Euvolemic to dry on exam. Plan: -Holding torsemide 20 mg twice daily given dehydration, AKI, hypercalcemia -Restarted beta-blocker, Imdur, hydralazine -Holding Entresto in the setting of AKI  Other home medications continued this admission: Protonix 40 mg QD, senokot s  Diet: Dysphagia 1 IVF: 75 cc/hour LR 10 hours VTE: Heparin Code: Full  Prior to Admission Living Arrangement: [place] [facility] Anticipated Discharge Location: [facility]  [facility] Barriers to Discharge: Infectious workup and clinical improvement Dispo: Anticipated discharge in  approximately 2-3 day(s).  Portions of this report may have been transcribed using voice recognition software. Every effort was made to ensure accuracy; however, inadvertent computerized transcription errors may be present.   Wayland Denis, MD 12/08/21,  10:43 AM Pager: [phone] Internal Medicine Resident, PGY-1 [facility]

## 2021-12-09 DIAGNOSIS — Z515 Encounter for palliative care: Secondary | ICD-10-CM

## 2021-12-09 DIAGNOSIS — D219 Benign neoplasm of connective and other soft tissue, unspecified: Secondary | ICD-10-CM

## 2021-12-09 DIAGNOSIS — Z7189 Other specified counseling: Secondary | ICD-10-CM

## 2021-12-09 DIAGNOSIS — Z789 Other specified health status: Secondary | ICD-10-CM

## 2021-12-09 DIAGNOSIS — D649 Anemia, unspecified: Secondary | ICD-10-CM

## 2021-12-09 DIAGNOSIS — Z66 Do not resuscitate: Secondary | ICD-10-CM

## 2021-12-09 DIAGNOSIS — R638 Other symptoms and signs concerning food and fluid intake: Secondary | ICD-10-CM

## 2021-12-09 LAB — TYPE AND SCREEN
ABO/RH(D): O POS
Antibody Screen: NEGATIVE
Donor AG Type: NEGATIVE
Donor AG Type: NEGATIVE
Unit division: 0
Unit division: 0

## 2021-12-09 LAB — CBC
HCT: 21.2 % — ABNORMAL LOW (ref 36.0–46.0)
HCT: 22.5 % — ABNORMAL LOW (ref 36.0–46.0)
Hemoglobin: 6.6 g/dL — CL (ref 12.0–15.0)
Hemoglobin: 7.4 g/dL — ABNORMAL LOW (ref 12.0–15.0)
MCH: 26 pg (ref 26.0–34.0)
MCH: 27.1 pg (ref 26.0–34.0)
MCHC: 31.1 g/dL (ref 30.0–36.0)
MCHC: 32.9 g/dL (ref 30.0–36.0)
MCV: 83.5 fL (ref 80.0–100.0)
MCV: 82.4 fL (ref 80.0–100.0)
Platelets: 370 10*3/uL (ref 150–400)
Platelets: 360 10*3/uL (ref 150–400)
RBC: 2.54 MIL/uL — ABNORMAL LOW (ref 3.87–5.11)
RBC: 2.73 MIL/uL — ABNORMAL LOW (ref 3.87–5.11)
RDW: 23.9 % — ABNORMAL HIGH (ref 11.5–15.5)
RDW: 22.5 % — ABNORMAL HIGH (ref 11.5–15.5)
WBC: 22 10*3/uL — ABNORMAL HIGH (ref 4.0–10.5)
WBC: 20.6 10*3/uL — ABNORMAL HIGH (ref 4.0–10.5)
nRBC: 0 % (ref 0.0–0.2)
nRBC: 0.2 % (ref 0.0–0.2)

## 2021-12-09 LAB — BPAM RBC
Blood Product Expiration Date: 202301112359
Blood Product Expiration Date: 202301112359
ISSUE DATE / TIME: 202212150630
Unit Type and Rh: 5100
Unit Type and Rh: 5100

## 2021-12-09 LAB — COMPREHENSIVE METABOLIC PANEL
ALT: 9 U/L (ref 0–44)
AST: 13 U/L — ABNORMAL LOW (ref 15–41)
Albumin: 2 g/dL — ABNORMAL LOW (ref 3.5–5.0)
Alkaline Phosphatase: 151 U/L — ABNORMAL HIGH (ref 38–126)
Anion gap: 9 (ref 5–15)
BUN: 52 mg/dL — ABNORMAL HIGH (ref 6–20)
CO2: 23 mmol/L (ref 22–32)
Calcium: 10.8 mg/dL — ABNORMAL HIGH (ref 8.9–10.3)
Chloride: 112 mmol/L — ABNORMAL HIGH (ref 98–111)
Creatinine, Ser: 1.18 mg/dL — ABNORMAL HIGH (ref 0.44–1.00)
GFR, Estimated: 57 mL/min — ABNORMAL LOW (ref >60)
Glucose, Bld: 90 mg/dL (ref 70–99)
Potassium: 3.4 mmol/L — ABNORMAL LOW (ref 3.5–5.1)
Sodium: 144 mmol/L (ref 135–145)
Total Bilirubin: 1.1 mg/dL (ref 0.3–1.2)
Total Protein: 6.8 g/dL (ref 6.5–8.1)

## 2021-12-09 LAB — CULTURE, BLOOD (ROUTINE X 2)
Culture: NO GROWTH
Special Requests: ADEQUATE

## 2021-12-09 LAB — PREPARE RBC (CROSSMATCH)

## 2021-12-09 MED ORDER — POTASSIUM CHLORIDE 20 MEQ PO PACK
40.0000 meq | PACK | Freq: Two times a day (BID) | ORAL | Status: AC
  Administered 2021-12-09 (×2): 40 meq via ORAL
  Filled 2021-12-09 (×2): qty 2

## 2021-12-09 MED ORDER — TRAMADOL HCL 50 MG PO TABS
50.0000 mg | ORAL_TABLET | Freq: Once | ORAL | Status: AC
  Administered 2021-12-09: 11:00:00 50 mg via ORAL
  Filled 2021-12-09: qty 1

## 2021-12-09 MED ORDER — LACTATED RINGERS IV SOLN
INTRAVENOUS | Status: AC
Start: 2021-12-09 — End: 2021-12-10

## 2021-12-09 MED ORDER — SODIUM CHLORIDE 0.9% IV SOLUTION: Freq: Once | INTRAVENOUS | Status: AC

## 2021-12-09 NOTE — Progress Notes (Signed)
° ° °  [facility] Attending Consult Followup Note  Subjective:  Patient reports ongoing abdominal pain, currently being medicated by RN. Has brown vaginal discharge noted, no active vaginal bleeding.  However, hemoglobin was 6.6 today, currently receiving one unit of pRBC ordered by primary team.   Objective:  Blood pressure (!) 98/55, pulse 67, temperature 98.1 F (36.7 C), resp. rate 20, SpO2 100 %. Gen: NAD HENT: Normocephalic, atraumatic Lungs: Normal respiratory effort Heart: Regular rate noted Abdomen: fibroid uterus palpated, mild TTP, no peritoneal signs Pelvic: Done by RN as chaperone, scant brown discharge noted on Pure[place], no active bleeding Ext: 2+ DTRs, no edema, no cyanosis, negative Homan's sign  Labs: CBC Latest Ref Rng & Units 12/09/2021 12/08/2021 12/07/2021  WBC 4.0 - 10.5 K/uL 22.0(H) 26.4(H) 25.6(H)  Hemoglobin 12.0 - 15.0 g/dL 6.6(LL) 9.1(L) 7.5(L)  Hematocrit 36.0 - 46.0 % 21.2(L) 28.0(L) 23.9(L)  Platelets 150 - 400 K/uL 370 430(H) 404(H)   CMP Latest Ref Rng & Units 12/09/2021 12/08/2021 12/07/2021  Glucose 70 - 99 mg/dL 90 97 112(H)  BUN 6 - 20 mg/dL 52(H) 61(H) 76(H)  Creatinine 0.44 - 1.00 mg/dL 1.18(H) 1.17(H) 1.37(H)  Sodium 135 - 145 mmol/L 144 145 141  Potassium 3.5 - 5.1 mmol/L 3.4(L) 3.5 3.9  Chloride 98 - 111 mmol/L 112(H) 109 110  CO2 22 - 32 mmol/L 23 24 23   Calcium 8.9 - 10.3 mg/dL 10.8(H) 11.4(H) 10.9(H)  Total Protein 6.5 - 8.1 g/dL 6.8 - 7.5  Total Bilirubin 0.3 - 1.2 mg/dL 1.1 - 1.0  Alkaline Phos 38 - 126 U/L 151(H) - 159(H)  AST 15 - 41 U/L 13(L) - 18  ALT 0 - 44 U/L 9 - 11    Assessment & Plan:  No signs of active bleeding for uterus, unsure etiology of continued hemoglobin drop. CT scan did not show fluid in endometrial cavity, so not concerned about concealed hemometra.  Will continue to monitor. Given her multiple co-morbidities, we hope to avoid any surgical intervention.  Continue Zosyn and analgesia  Continue  care as per primary team, appreciate their care for this mutual patient.   We will continue to follow along.   Verita Schneiders, MD, [place] [facility], [place] Phone: [phone] (M-F, [phone]) & [phone] (Off hours, weekends, holidays)

## 2021-12-09 NOTE — Progress Notes (Signed)
Subjective:  Overnight events: Hgb dropped to 6.6, received 1u pRBCs.  Answers "good" when asked how she is doing. When asked if in pain, states "stomach hurts" and nods head when pointing to lower abdomen. When asked about breakfast, states "not hungry" but is drinking some apple juice on her own.   Discussed ongoing management plan, she was nodding her head when asked if she was following along. No other questions or concerns at this time.  Objective:  Vital signs in last 24 hours: Vitals:   12/09/21 1000 12/09/21 1016 12/09/21 1208 12/09/21 1330  BP: 113/69 (!) 98/55 109/67 124/69  Pulse: 80 67 78 74  Resp: 16 20 20 15   Temp: 98.1 F (36.7 C) 98.1 F (36.7 C) (!) 97.4 F (36.3 C) 98.3 F (36.8 C)  TempSrc: Axillary  Oral Oral  SpO2: 100%  99% 98%   Physical Exam: General: well-appearing female, lying in bed, NAD. CV: normal rate and regular rhythm, no m/r/g. Pulm: Normal WOB on RA, no respiratory distress noted. Abdomen: nondistended, normoactive bowel sounds. Firm masses palpated in lower abdomen, TTP in this area as well. Neuro: Awake and alert. Following simple commands and answers most questions with short answers. 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    #Acute infectious process, concern for acute intra-abdominopelvic infection #Acute blood loss anemia Patient presented with leukocytosis and became febrile during hospitalization.  Continues to endorse lower abdominal/pelvic pain  with foul-smelling dark vaginal discharge seen on exam.  Required another unit of pRBCs overnight, for total 2 units thus far. Given bleeding and lower abdominal pain, CT abdomen pelvis was obtained which showed multiple intramural fibroids, multiple masslike soft tissue structures with evidence of soft tissue gas new from prior.  Radiology differential includes endometrial carcinoma with necrosis versus infarcted intracavitary fibroid. Started on IV zosyn yesterday due to concern for intra-abdominopelvic infection, possibly a degenerating fibroid. Gynecology consulted, do not believe that patient's source of infection/bleeding is intrauterine and thus no plans for surgical intervention at this time. Upon chart review, do not see any prior history of acute bleeding episodes aside from uterine bleeding from fibroids. Unclear at this time what could be causing acute blood loss anemia, but remain suspicious for uterine cause given high fibroid burden. Unable to reach brother, [name], for updates but left VM and will attempt to call again tomorrow.  Plan: -Appreciate Gynecology recs -IV Zosyn per pharmacy  -Trend CBC -Transfuse if hemoglobin < 7  #Moderate hypercalcemia Patient noted to be hypercalcemic this admission as well as last admission 3 to 4 weeks ago.  Is having encephalopathy and abdominal pain which may be worsened by hypercalcemia. Patient does have history of HFpEF, will need to be mindful with fluids.  PTH low normal, suggesting non-PTH mediated hypercalcemia, which can occur secondary to malignancy, vitamin D intoxication, granulomatous inflammatory process, bone resorption seen in immobilization. Vit D normal.  It is possible PTH is inappropriately low normal and should be even lower given hypercalcemia, in this case concern for primary hyperparathyroidism.   Plan: -PTH  related peptide pending -75 cc/HR LR for 10 hours -Continue to trend corrected calcium  #Acute encephalopathy,  resolving Intracranial imaging negative for acute process.  Suspect due to dehydration, acute infectious or inflammatory process see above.  Patient also has hypercalcemia ongoing since previous admission, which may be contributing to encephalopathy.  Has history of severe MCA stroke with poor functional status at baseline. Plan: -Management of acute infectious process as above -Management of moderate hypercalcemia as above -PT/OT/SLP eval and treat -Dysphagia 1 diet  #Prolonged QT, resolved #Paroxysmal SVT On 12/14, pt had prolonged QT of 550 to 600. An EKG showed NSR with corrected QT of 503.  Patient has history of paroxysmal SVT for which she takes Amiodarone 200 mg daily.  Also on Lexapro 20 mg daily. Both are QT prolonging agents. Plan: -Holding Amiodarone and Lexapro in the setting of QT prolongation -Telemetry monitoring  #Acute kidney injury likely 2/2 dehydration, improving Serum creatinine 2.04 on admission (baseline of ~0.7).  AKI likely secondary to dehydration, kidney function improving.  Creatinine stable at 1.18 today. Plan: -Trend BMP -Avoid nephrotoxic agents -75 cc/HR LR for 14 hours  #Palliative Care Consult Goals of care discussed with patient and brother. Pt consented to DNR/DNI status. Brother conveys that he would like for his sister to receive continued medical evaluation and care at this time. He is open to [place] discussions at a later time if needed.    #Hx LLE DVT Patient previously on Eliquis which was discontinued in the setting of recent uterine bleed (October 2022).  An IVC filter was placed 10/2021 to prevent PE since patient unable to take anticoagulant. Patient is status post uterine artery embolization.  Needs follow-up for in Feb-March with vascular surgery to discuss resuming anticoagulation versus removing IVC filter, and reassess status of DVT in left leg. 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  Appears to be at neurologic baseline given recent improvement in communication and command following.  Follows with Dr. Leonie Man in stroke clinic. Plan: -Continue aspirin 81 mg daily -Continue atorvastatin 20 mg daily patient was restarted on home blood pressure medications for elevated blood pressures.  #Hypertension #HF with recovered EF Recent echo in May 2022 revealed EF of 55-60% with grade II diastolic dysfunction and severe left atrial enlargement.  Euvolemic to dry on exam. Patient was restarted on antihypertensives for elevated blood pressures. Blood pressures at goal today.   Plan: -Restarted Clonidine 0.1  TID -Restarted Carvedilol 25 mg BID -Restarted Hydralazine 100mg  TID -Restarted Imdur 60mg  daily  -holding entresto and torsemide in setting of AKI   Diet: Dysphagia 1 IVF: 75 cc/hour LR 14 hours VTE: Heparin Code: Full  Prior to Admission Living Arrangement: [place] [facility] Anticipated Discharge Location: [place] [facility] Barriers to Discharge: Infectious workup and clinical improvement Dispo: Anticipated discharge in approximately 2-3 day(s).  Virl Axe, MD 12/09/21,  2:06 PM Pager: [phone] Internal Medicine Resident, PGY-2 [facility]

## 2021-12-09 NOTE — Progress Notes (Signed)
Daily Progress Note   [name]       Date: 12/09/2021 DOB: 05-Dec-1976  Age: 45 y.o. MRN#: [id number] Attending Physician: Aldine Contes, MD Primary Care Physician: Mitzi Hansen, MD Admit Date: 12/03/2021  Reason for Consultation/Follow-up:  Establishing goals of care left hemiplegia and dysarthria from prior stroke  Subjective: Chart review performed. Received updates from primary RN - no acute concerns. RN reports patient has been tired, eating about 25% of meals, but drinking well.   Went to visit patient at bedside - RN was present stopping most recent blood transfusion as it was completed. Patient was lying in bed awake, alert, oriented x3 (self, date, place), and able to participate in simple conversation - allowed time for patient to respond to questions appropriately as her responses were delayed. Patient tells me she does not feel well today - denies pain at this time. She is tired. She does not remember getting multiple blood transfusions. She is agreeable I can call her brother.  Called her brother/Holly Bradley - emotional support provided. Provided updates from mine and RN assessment today. Provided medical updates per his request - answered all questions. He requests attending call daily with updates if possible. Reviewed interval history since his last meeting with PMT. At this time, goal is to continue full scope treatment with watchful waiting. Holly Bradley tells me has has questions written down to ask PMT, but the list is at home. He states he will call PMT Monday or Tuesday to review. He does have PMT number.   All questions and concerns addressed. Encouraged to call with questions and/or concerns. PMT number previously provided.  Length of Stay: 5  Current  Medications: Scheduled Meds:   aspirin  81 mg Oral Daily   atorvastatin  40 mg Oral Daily   carvedilol  25 mg Oral BID WC   cloNIDine  0.1 mg Oral TID   heparin  5,000 Units Subcutaneous Q8H   hydrALAZINE  100 mg Oral TID   isosorbide mononitrate  60 mg Oral Daily   mouth rinse  15 mL Mouth Rinse BID   pantoprazole  40 mg Oral Daily   potassium chloride  40 mEq Oral BID   senna-docusate  2 tablet Oral BID    Continuous Infusions:  piperacillin-tazobactam (ZOSYN)  IV 3.375 g (12/09/21 1975)  PRN Meds: acetaminophen **OR** acetaminophen, hydrALAZINE  Physical Exam Vitals and nursing note reviewed.  Constitutional:      General: She is not in acute distress.    Appearance: She is ill-appearing.  Pulmonary:     Effort: No respiratory distress.  Skin:    General: Skin is warm and dry.  Neurological:     Mental Status: She is alert and oriented to person, place, and time.     Motor: Weakness present.  Psychiatric:        Attention and Perception: Attention normal.        Speech: Speech is delayed.        Behavior: Behavior is slowed. Behavior is cooperative.        Cognition and Memory: Cognition and memory normal.            Vital Signs: BP 109/67 (BP Location: Right Arm)    Pulse 78    Temp (!) 97.4 F (36.3 C) (Oral)    Resp 20    SpO2 99%  SpO2: SpO2: 99 % O2 Device: O2 Device: Room Air O2 Flow Rate:    Intake/output summary:  Intake/Output Summary (Last 24 hours) at 12/09/2021 1327 Last data filed at 12/09/2021 1209 Gross per 24 hour  Intake 470 ml  Output 400 ml  Net 70 ml   LBM: Last BM Date: 12/08/21 Baseline Weight:   Most recent weight:         Palliative Assessment/Data: PPS 20-30%      Patient Active Problem List   Diagnosis Date Noted   Hypercalcemia 12/04/2021   Acute encephalopathy 12/03/2021   Weakness 11/22/2021   Pressure injury of skin 10/13/2021   Lupus anticoagulant positive    Deep vein thrombosis (DVT) of  iliac vein of left lower extremity (HCC)    Acute blood loss anemia 10/10/2021   Hypertension    Non-ischemic cardiomyopathy (HCC)    Swelling of left hand    Proctocolitis    Constipation    Uterine fibroid    Left hemiparesis (HCC)    Hemorrhagic stroke (HCC)    Abnormal uterine bleeding (AUB)    Staphylococcus epidermidis bacteremia    Iron deficiency anemia due to chronic blood loss 05/25/2021   Cerebral edema (HCC) 05/25/2021   Chronic HFrEF (heart failure with reduced ejection fraction) ([place]) 05/25/2021   Pneumonia due to methicillin susceptible Staphylococcus aureus ([place]) 05/25/2021   Acute respiratory failure with hypoxia (HCC)    Acute right MCA stroke ([place]) 05/15/2021   Uncontrolled hypertension 05/15/2021   Cerebrovascular accident (CVA) ([place]) 05/15/2021   IDA (iron deficiency anemia) 03/01/2021   Healthcare maintenance 03/01/2021   Onychomycosis 03/01/2021   Food insecurity 02/02/2021   Psychosis ([place])    Fibroid 01/11/2021   Menorrhagia 01/11/2021   Acute exacerbation of CHF (congestive heart failure) ([place]) 01/10/2021   AKI (acute kidney injury) ([place])    Symptomatic anemia    SVT (supraventricular tachycardia) ([place]) 05/01/2020   CHF (congestive heart failure) ([place]) 05/01/2020    Palliative Care Assessment & Plan   Patient Profile: 45 y.o. female  with history of right MCA 2022 04/2021 with thrombectomy, intubation, trach and decannulation, and discharge to [facility] 06/2021.  Additional history includes of DVT, chronic heart failure with recovered EF, paroxysmal SVT, hypertension, and uterine fibroids s/p embolization. She presented to the emergency department on 12/03/2021 with altered mental status. Patient resides at [facility] and facility staff noted change in mental status from baseline. In the ED, she  was found to be febrile at 100.4 with labs significant for leukocytosis of 17.6 and elevated creatinine of 2.5. Chest x-ray was  unremarkable. CT head shows no evidence of acute intracranial abnormalities. Admitted to IMTS for further evaluation of acute encephalopathy.  12/13 - EEG negative for seizures and is suggestive of moderate diffuse encephalopathy  Assessment: Acute encephalopathy likely secondary to hypercalcemia vs infection Normocytic blood loss anemia AKI Dehydration Aphasia Left sided weakness Concern for endometriosis  Recommendations/Plan: Continue full scope medical treatment with watchful waiting Continue DNR/DNI as previously documented Brother requests to be called daily with updates by attending Brother has questions for PMT - he will call us Monday or Tuesday to discuss further Ongoing GOC pending clinical course PMT will continue to follow and support holistically   Goals of Care and Additional Recommendations: Limitations on Scope of Treatment: Full Scope Treatment and No Tracheostomy  Code Status:    Code Status Orders  (From admission, onward)           Start     Ordered   12/06/21 1623  Do not attempt resuscitation (DNR)  Continuous       Question Answer Comment  In the event of cardiac or respiratory ARREST Do not call a code blue   In the event of cardiac or respiratory ARREST Do not perform Intubation, CPR, defibrillation or ACLS   In the event of cardiac or respiratory ARREST Use medication by any route, position, wound care, and other measures to relive pain and suffering. May use oxygen, suction and manual treatment of airway obstruction as needed for comfort.      12/06/21 1622           Code Status History     Date Active Date Inactive Code Status Order ID Comments User Context   12/03/2021 2016 12/06/2021 1622 Full Code [id number]  Mike Craze, DO ED   11/22/2021 0435 11/25/2021 0029 Full Code [id number]  Rick Duff, MD ED   10/10/2021 0313 10/27/2021 1655 Partial Code [id number]  Rise Patience, MD Inpatient   05/30/2021 1101 07/04/2021 0522  Partial Code [id number]  Philis Pique, NP Inpatient   05/15/2021 0754 05/30/2021 1101 Full Code [id number]  Greta Doom, MD ED   02/01/2021 1644 02/02/2021 2005 Full Code [id number]  Ledora Bottcher, [facility]   01/10/2021 1455 01/27/2021 2035 Full Code [id number]  Marty Heck, DO ED   05/01/2020 1644 05/03/2020 1813 Full Code [id number]  Lequita Halt, MD ED       Prognosis:  Unable to determine  Discharge Planning: To Be Determined  Care plan was discussed with primary RN, patient, patient's brother  Thank you for allowing the Palliative Medicine Team to assist in the care of this patient.   Total Time 40 minutes Prolonged Time Billed  no       Greater than 50%  of this time was spent counseling and coordinating care related to the above assessment and plan.  Lin Landsman, NP  Please contact Palliative Medicine Team phone at [phone] for questions and concerns.

## 2021-12-09 NOTE — Progress Notes (Signed)
This patient's hemoglobin resulted at 6.6.  The provider on call has been made aware.

## 2021-12-10 ENCOUNTER — Inpatient Hospital Stay: Payer: Medicaid Other | Attending: Hematology and Oncology | Admitting: Hematology and Oncology

## 2021-12-10 ENCOUNTER — Inpatient Hospital Stay: Payer: Medicaid Other

## 2021-12-10 ENCOUNTER — Encounter: Payer: Self-pay | Admitting: *Deleted

## 2021-12-10 LAB — IRON AND TIBC
Iron: 51 ug/dL (ref 28–170)
Saturation Ratios: 35 % — ABNORMAL HIGH (ref 10.4–31.8)
TIBC: 146 ug/dL — ABNORMAL LOW (ref 250–450)
UIBC: 95 ug/dL

## 2021-12-10 LAB — COMPREHENSIVE METABOLIC PANEL
ALT: 9 U/L (ref 0–44)
AST: 14 U/L — ABNORMAL LOW (ref 15–41)
Albumin: 1.9 g/dL — ABNORMAL LOW (ref 3.5–5.0)
Alkaline Phosphatase: 149 U/L — ABNORMAL HIGH (ref 38–126)
Anion gap: 7 (ref 5–15)
BUN: 44 mg/dL — ABNORMAL HIGH (ref 6–20)
CO2: 23 mmol/L (ref 22–32)
Calcium: 10.6 mg/dL — ABNORMAL HIGH (ref 8.9–10.3)
Chloride: 114 mmol/L — ABNORMAL HIGH (ref 98–111)
Creatinine, Ser: 0.98 mg/dL (ref 0.44–1.00)
GFR, Estimated: >60 mL/min (ref >60)
Glucose, Bld: 96 mg/dL (ref 70–99)
Potassium: 4.6 mmol/L (ref 3.5–5.1)
Sodium: 144 mmol/L (ref 135–145)
Total Bilirubin: 1.2 mg/dL (ref 0.3–1.2)
Total Protein: 6.9 g/dL (ref 6.5–8.1)

## 2021-12-10 LAB — BPAM RBC
Blood Product Expiration Date: 202301102359
ISSUE DATE / TIME: 202212180954
Unit Type and Rh: 5100

## 2021-12-10 LAB — CBC
HCT: 24.9 % — ABNORMAL LOW (ref 36.0–46.0)
Hemoglobin: 8.2 g/dL — ABNORMAL LOW (ref 12.0–15.0)
MCH: 26.8 pg (ref 26.0–34.0)
MCHC: 32.9 g/dL (ref 30.0–36.0)
MCV: 81.4 fL (ref 80.0–100.0)
Platelets: 347 10*3/uL (ref 150–400)
RBC: 3.06 MIL/uL — ABNORMAL LOW (ref 3.87–5.11)
RDW: 22.3 % — ABNORMAL HIGH (ref 11.5–15.5)
WBC: 18.1 10*3/uL — ABNORMAL HIGH (ref 4.0–10.5)
nRBC: 0 % (ref 0.0–0.2)

## 2021-12-10 LAB — TYPE AND SCREEN
ABO/RH(D): O POS
Antibody Screen: NEGATIVE
Donor AG Type: NEGATIVE
Unit division: 0

## 2021-12-10 LAB — FERRITIN: Ferritin: 1208 ng/mL — ABNORMAL HIGH (ref 11–307)

## 2021-12-10 MED ORDER — LACTATED RINGERS IV SOLN: INTRAVENOUS | Status: AC

## 2021-12-10 NOTE — Progress Notes (Signed)
Patient ID: Holly Bradley, female   DOB: 05/25/1976, 45 y.o.   MRN: [id number]    [facility] Attending Consult Followup Note  Subjective:  Patient reports ongoing abdominal pain, currently being medicated by RN. Minimal vaginal discharge noted, no active vaginal bleeding.  Hemoglobin improved to 8.2.    Objective:  Blood pressure (!) 167/87, pulse 69, temperature 97.8 F (36.6 C), temperature source Oral, resp. rate 16, SpO2 99 %. Gen: NAD HENT: Normocephalic, atraumatic Lungs: Normal respiratory effort Heart: Regular rate noted Abdomen: fibroid uterus palpated, mild TTP, no peritoneal signs Pelvic: RN as chaperone, scant brown discharge noted on Purewick, no active bleeding Ext: 2+ DTRs, no edema, no cyanosis, negative Homan's sign  Labs: CBC Latest Ref Rng & Units 12/10/2021 12/09/2021 12/09/2021  WBC 4.0 - 10.5 K/uL 18.1(H) 20.6(H) 22.0(H)  Hemoglobin 12.0 - 15.0 g/dL 8.2(L) 7.4(L) 6.6(LL)  Hematocrit 36.0 - 46.0 % 24.9(L) 22.5(L) 21.2(L)  Platelets 150 - 400 K/uL 347 360 370   CMP Latest Ref Rng & Units 12/10/2021 12/09/2021 12/08/2021  Glucose 70 - 99 mg/dL 96 90 97  BUN 6 - 20 mg/dL 44(H) 52(H) 61(H)  Creatinine 0.44 - 1.00 mg/dL 0.98 1.18(H) 1.17(H)  Sodium 135 - 145 mmol/L 144 144 145  Potassium 3.5 - 5.1 mmol/L 4.6 3.4(L) 3.5  Chloride 98 - 111 mmol/L 114(H) 112(H) 109  CO2 22 - 32 mmol/L 23 23 24   Calcium 8.9 - 10.3 mg/dL 10.6(H) 10.8(H) 11.4(H)  Total Protein 6.5 - 8.1 g/dL 6.9 6.8 -  Total Bilirubin 0.3 - 1.2 mg/dL 1.2 1.1 -  Alkaline Phos 38 - 126 U/L 149(H) 151(H) -  AST 15 - 41 U/L 14(L) 13(L) -  ALT 0 - 44 U/L 9 9 -    Assessment & Plan:  No signs of active bleeding for uterus.  Will continue to monitor.  Given her multiple co-morbidities, we hope to avoid any surgical intervention.  Continue Zosyn and analgesia  Continue care as per primary team, appreciate their care for this mutual patient.   We will continue to follow  along.   Mora Bellman, MD, [place] for [facility], [place] Phone: [phone] (M-F, [phone]) & [phone] (Off hours, weekends, holidays)

## 2021-12-10 NOTE — Plan of Care (Signed)
°  Problem: Education: Goal: Knowledge of General Education information will improve Description: Including pain rating scale, medication(s)/side effects and non-pharmacologic comfort measures Outcome: Not Progressing   Problem: Health Behavior/Discharge Planning: Goal: Ability to manage health-related needs will improve Outcome: Not Progressing   Problem: Clinical Measurements: Goal: Ability to maintain clinical measurements within normal limits will improve Outcome: Not Progressing Goal: Will remain free from infection Outcome: Not Progressing Goal: Diagnostic test results will improve Outcome: Not Progressing Goal: Respiratory complications will improve Outcome: Not Progressing Goal: Cardiovascular complication will be avoided Outcome: Not Progressing   Problem: Activity: Goal: Risk for activity intolerance will decrease Outcome: Not Progressing   Problem: Nutrition: Goal: Adequate nutrition will be maintained Outcome: Not Progressing   Problem: Coping: Goal: Level of anxiety will decrease Outcome: Not Progressing   Problem: Elimination: Goal: Will not experience complications related to bowel motility Outcome: Not Progressing Goal: Will not experience complications related to urinary retention Outcome: Not Progressing   Problem: Safety: Goal: Ability to remain free from injury will improve Outcome: Not Progressing   Problem: Skin Integrity: Goal: Risk for impaired skin integrity will decrease Outcome: Not Progressing   

## 2021-12-10 NOTE — Progress Notes (Signed)
Speech Language Pathology Treatment: Dysphagia  Patient Details Name: Holly Bradley MRN: [id number] DOB: 06/28/1976 Today's Date: 12/10/2021 Time: 4818-5631 SLP Time Calculation (min) (ACUTE ONLY): 17 min  Assessment / Plan / Recommendation Clinical Impression  Pt appears to be much more alert this morning compared to what was described during MBS, at which time pt had sensed aspiration of thin liquids that was delayed but effective at clearing aspirates. Given this suspected increase in alertness, SLP provided advanced trials of thin liquids with coughing noted x1 when pt was partially reclined after trials were finished. This could also potentially be related to oral residue, which was noted with trials of puree but had seemed to be better cleared after liquid washes. Pt is eager to have water and did need some cueing to slow her pacing, but she followed this instructions well when given verbal/tactile cues. Given her fluctuating performance so far this admission, recommend continuing current diet of Dys 1 solids and nectar thick liquids; however, in between meals, pt could have small cup sips of thin water if following the following precautions: wait at least 30 minutes after any other POs; perform oral care first; offer water only. Would not offer trials if not fully alert and engaged in intake, and would cease trials if coughing is noted. SLP will f/u for potential to progress.    HPI HPI: Pt is a 45 y/o female admitted 12/03/21 with AMS. MRI brain was negative. EEG 12/13: generalized periodic discharges with triphasic morphology which can be on the ictal-interictal continuum. PMT 12/15: continue current medical interventions with watchful waiting, code status changed to DNR/DNI.  Hx of right MCA CVA 04/2021 with thrombectomy, multiple intubations, trach and decannulation, D/Cd to [facility] 7/22. Several MBS studies - the last was 06/21/21, which revealed mild dysphagia with oral residue, occasional trace  aspiration of thin liquids with strong cough response. Dys3/thin liquids was recommended at D/C. Pt was last seen by SLP 11/22/21 and a dysphagia 1 diet with nectar thick liquids was recommended at that time. PMH: DVT, HTN, HF with recovered EF, paroxsymal SVT, uterine fibroids.      SLP Plan  Continue with current plan of care;MBS      Recommendations for follow up therapy are one component of a multi-disciplinary discharge planning process, led by the attending physician.  Recommendations may be updated based on patient status, additional functional criteria and insurance authorization.    Recommendations  Diet recommendations: Dysphagia 1 (puree);Nectar-thick liquid;Other(comment) (my have a few sips of water when alert once waiting 30 minutes after other POs and performing oral care) Liquids provided via: Cup;Straw Medication Administration: Crushed with puree Supervision: Trained caregiver to feed patient;Full supervision/cueing for compensatory strategies Compensations: Minimize environmental distractions;Slow rate;Small sips/bites;Follow solids with liquid Postural Changes and/or Swallow Maneuvers: Seated upright 90 degrees                Oral Care Recommendations: Oral care BID;Staff/trained caregiver to provide oral care;Oral care prior to ice chip/H20 Follow Up Recommendations: Skilled nursing-short term rehab (<3 hours/day) Assistance recommended at discharge: Frequent or constant Supervision/Assistance SLP Visit Diagnosis: Dysphagia, oropharyngeal phase (R13.12) Plan: Continue with current plan of care;MBS           Osie Bond., M.A. [facility] Pager [phone] Office [phone]  12/10/2021, 11:39 AM

## 2021-12-10 NOTE — Progress Notes (Signed)
Subjective: No acute events or concerns overnight.  Patient indicates with hand that she is feeling so-so this morning.  Is still having abdominal pain.  Last documented BM 12/18.  Discussed concern for uterine bleeding.  Patient hoping to avoid hysterectomy.  Discussed getting continued abdominal pain, bleeding will need to discuss with gynecology further.  Objective:  Vital signs in last 24 hours: Vitals:   12/10/21 0000 12/10/21 0354 12/10/21 0743 12/10/21 1151  BP: 126/76 (!) 146/80 (!) 167/87 106/68  Pulse: 76 73 69 74  Resp: 12 14 16 16   Temp: 98.1 F (36.7 C) 98.1 F (36.7 C) 97.8 F (36.6 C) 98.2 F (36.8 C)  TempSrc: Axillary Oral Oral Oral  SpO2: 99% 99% 99% 98%   General Physical Exam: Constitutional: alert, well-appearing, in no acute distress.   HENT: normocephalic, atraumatic, moist mucous membranes Eyes: conjunctiva non-erythematous, extraocular movements intact Neck: supple Cardiovascular: regular rate and rhythm, no m/r/g Pulmonary/Chest: normal work of breathing on room air, Lungs CTA.  Abdominal: TTP epigastric area and lower abdomen, firm masses felt lower abdomen with mild distention Neurological: Awake, alert, intermittently follows commands.  Intermittently answers questions, short answers/ asks questions, shakes head yes no.  Left heel in Prevalon boot. Skin: warm and dry, no rashes or lesions on exposed surfaces Psych: normal behavior, blunted affect   Assessment/Plan:  Principal Problem:   Acute encephalopathy Active Problems:   AKI (acute kidney injury) ([place])   Hypertension   Pressure injury of skin   Hypercalcemia  Pt is a 45 year old female with pertinent history of CVA in 2022 residual left sided hemiplegia, history of DVT, hypertension, heart failure with recovered EF, paroxysmal SVT, uterine fibroids status post uterine embolization admitted for acute encephalopathy likely component of dehydration vs hypercalcemia vs acute infectious  process.    #Acute infectious process, concern for acute intra-abdominopelvic infection #Acute blood loss anemia, likely 2/2 vaginal bleeding White count downtrending from 26, febrile this admission.  Endorsing lower abdominal/pelvic pain with report of foul-smelling dark vaginal discharge.  Acute blood loss anemia requiring 2 unit RBCs this admission so far.  Given bleeding and lower abdominal pain, CT abdomen pelvis was obtained which showed multiple intramural fibroids, multiple masslike soft tissue structures with evidence of soft tissue gas new from prior.  Radiology differential included endometrial carcinoma with necrosis versus infected incarcerated fibroid. On broad abx for acute intra-abdominopelvic infection. Gynecology consulted, possibly late onset endometritis following [place] . Pain discharge, low grade fever, leukocytosis likely related to degenerating fibroid. Should expect abdominal pain, discharge to continue as fibroids degenerate. Hemoglobin stable today.  No current vaginal bleeding.  Will need to continue to monitor hemoglobin to ensure stability. Plan: -Gynecology consult, appreciate recommendations -IV Zosyn per pharmacy  -Trend CBC -Transfuse if hemoglobin < 7 -Iron Profile, ferritin pending  #Moderate hypercalcemia, improving Patient noted to be hypercalcemic this admission as well as last admission 3 to 4 weeks ago.  Is having encephalopathy and abdominal pain which may be worsened by hypercalcemia.  S/p half liter LR bolus with maintenance fluids.  Patient does have history of HFpEF, will need to be mindful with fluids.  Corrected calcium today 12.3, improving.  PTH low normal, suggesting non-PTH mediated hypercalcemia, which can occur secondary to malignancy, vitamin D intoxication, granulomatous inflammatory process, bone resorption seen in immobilization. Vit D normal.  It is possible PTH is inappropriately low normal and should be even lower given hypercalcemia, in this  case concern for primary hyperparathyroidism.   Plan: -  PTH related peptide pending -75 cc/HR LR for 14 hours -Continue to trend corrected calcium  #Acute encephalopathy, resolved Intracranial imaging negative for acute process.  Suspect due to dehydration, acute infectious or inflammatory process see above.  Patient also has hypercalcemia ongoing since previous admission, which may be contributing to encephalopathy.  Has history of severe MCA stroke with poor functional status at baseline. Plan: -Management of acute infectious process as above -Management of moderate hypercalcemia as above -PT/OT/SLP eval and treat -Dysphagia 1 diet  #Mild Hypokalemia, resolved Potassium low at 3.3 this admission, electrolytes were repleted as needed. Plan: -Continue to monitor electrolytes  #Prolonged QT, resolved #Paroxysmal SVT On 12/14, pt had prolonged QT of 550 to 600. An EKG showed NSR with corrected QT of 503.  Patient has history of paroxysmal SVT for which she takes Amiodarone 200 mg daily.  Also on Lexapro 20 mg daily. Both are QT prolonging agents. Plan: -Holding Amiodarone and Lexapro in the setting of QT prolongation -Telemetry monitoring  #Acute kidney injury likely 2/2 dehydration, improving Serum creatinine 2.04 on admission (baseline of ~0.7).  AKI likely secondary to dehydration, kidney function improving.  Creatinine today 1.17, e GFR 58. Plan: -Trend BMP -Avoid nephrotoxic agents -75 cc/HR LR for 14 hours  #Palliative Care Consult Goals of care discussed with patient and brother. Pt consented to DNR/DNI status. Brother conveys that he would like for his sister to receive continued medical evaluation and care at this time. He is open to [place] discussions at a later time if needed.    #Hx LLE DVT Patient previously on Eliquis which was discontinued in the setting of recent uterine bleed (October 2022).  An IVC filter was placed 10/2021 to prevent PE since patient unable to take  anticoagulant. Patient is status post uterine artery embolization.  Needs follow-up for in Feb-March with vascular surgery to discuss resuming anticoagulation versus removing IVC filter, and reassess status of DVT in left leg. 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  Appears to be at neurologic baseline given recent improvement in communication and command following.  Follows with Dr. Leonie Man in stroke clinic. Plan: -Continue aspirin 81 mg daily -Continue atorvastatin 20 mg daily patient was restarted on home blood pressure medications for elevated blood pressures.  #Hypertension Home medications: Carvedilol 25 mg twice daily, clonidine 0.1 mg 3 times daily, hydralazine 100 mg 3 times daily, Imdur 60 mg daily. Patient was restarted on antihypertensives for elevated blood pressures.  BPs appropriate.  Holding Entresto in the setting of AKI. Plan: -Restarted Clonidine 0.1  TID -Restarted Carvedilol 25 mg BID -Restarted Hydralazine 100mg  TID -Restarted Imdur 60mg  daily    #HF with recovered EF Recent echo in May 2022 revealed EF of 55-60% with grade II diastolic dysfunction and severe left atrial enlargement.  Euvolemic to dry on exam. Plan: -Holding torsemide 20 mg twice daily given dehydration, AKI, hypercalcemia -Restarted beta-blocker, Imdur, hydralazine, clonidine -Holding Entresto in the setting of AKI  Other home medications continued this admission: Protonix 40 mg QD, senokot s  Diet: Dysphagia 1 IVF: 75 cc/hour LR 10 hours VTE: Heparin Code: Full  Prior to Admission Living Arrangement: [employer] [facility] Anticipated Discharge Location: [place] [facility] Barriers to Discharge: Infectious workup and clinical improvement Dispo: Anticipated discharge in approximately 2-3 day(s).  Portions of this report may have been transcribed  using voice recognition software. Every effort  was made to ensure accuracy; however, inadvertent computerized transcription errors may be present.   Wayland Denis, MD 12/10/21,  1:15 PM Pager: [phone] Internal Medicine Resident, PGY-1 [facility]

## 2021-12-11 ENCOUNTER — Inpatient Hospital Stay (HOSPITAL_COMMUNITY): Payer: Medicaid Other

## 2021-12-11 DIAGNOSIS — Z711 Person with feared health complaint in whom no diagnosis is made: Secondary | ICD-10-CM

## 2021-12-11 LAB — COMPREHENSIVE METABOLIC PANEL
ALT: 8 U/L (ref 0–44)
AST: 15 U/L (ref 15–41)
Albumin: 1.8 g/dL — ABNORMAL LOW (ref 3.5–5.0)
Alkaline Phosphatase: 133 U/L — ABNORMAL HIGH (ref 38–126)
Anion gap: 8 (ref 5–15)
BUN: 36 mg/dL — ABNORMAL HIGH (ref 6–20)
CO2: 22 mmol/L (ref 22–32)
Calcium: 10.3 mg/dL (ref 8.9–10.3)
Chloride: 113 mmol/L — ABNORMAL HIGH (ref 98–111)
Creatinine, Ser: 0.92 mg/dL (ref 0.44–1.00)
GFR, Estimated: >60 mL/min (ref >60)
Glucose, Bld: 82 mg/dL (ref 70–99)
Potassium: 4.1 mmol/L (ref 3.5–5.1)
Sodium: 143 mmol/L (ref 135–145)
Total Bilirubin: 1 mg/dL (ref 0.3–1.2)
Total Protein: 6.4 g/dL — ABNORMAL LOW (ref 6.5–8.1)

## 2021-12-11 LAB — CBC
HCT: 24.6 % — ABNORMAL LOW (ref 36.0–46.0)
Hemoglobin: 7.9 g/dL — ABNORMAL LOW (ref 12.0–15.0)
MCH: 27 pg (ref 26.0–34.0)
MCHC: 32.1 g/dL (ref 30.0–36.0)
MCV: 84 fL (ref 80.0–100.0)
Platelets: 404 10*3/uL — ABNORMAL HIGH (ref 150–400)
RBC: 2.93 MIL/uL — ABNORMAL LOW (ref 3.87–5.11)
RDW: 23.5 % — ABNORMAL HIGH (ref 11.5–15.5)
WBC: 11.9 10*3/uL — ABNORMAL HIGH (ref 4.0–10.5)
nRBC: 0 % (ref 0.0–0.2)

## 2021-12-11 IMAGING — CT CT ABD-PELV W/ CM
2 of 5 series · 16 of 46 positions shown, 18 images · IV contrast (Omni 300)
Comparison: [DATE]

CLINICAL DATA: Abdominal pain.

EXAM:
CT ABDOMEN AND PELVIS WITH CONTRAST
TECHNIQUE: Multidetector CT imaging of the abdomen and pelvis was performed
using the standard protocol following bolus administration of
intravenous contrast.
CONTRAST:  100mL OMNIPAQUE IOHEXOL 300 MG/ML  SOLN

[Series 3: a/p w/ 5mm · axial · 0.72mm/px · z∈[+940,+1380]mm · 13 of 98 slices shown, 15 images]
[im 5/98  soft-tissue]
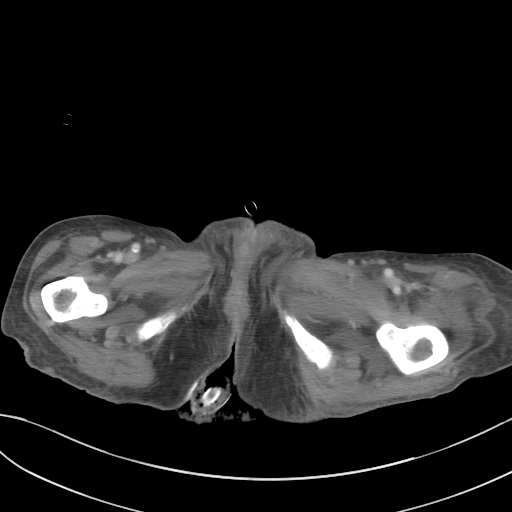
[im 5/98  bone]
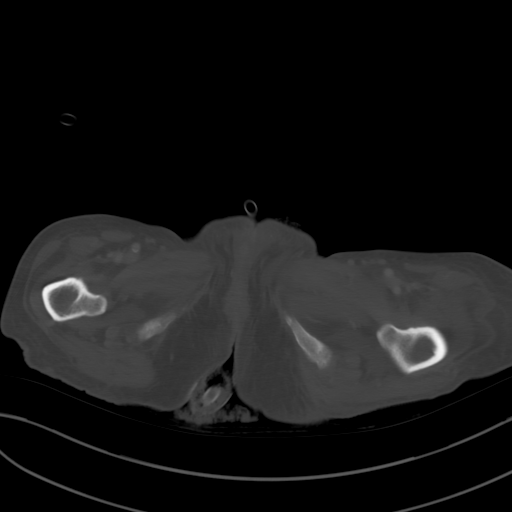
[im 15/98  soft-tissue]
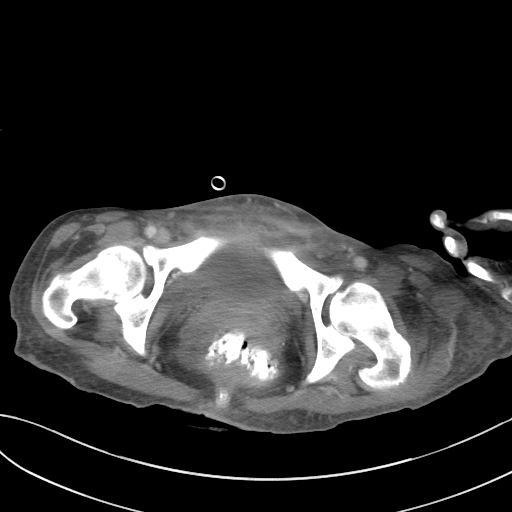
[im 20/98  soft-tissue]
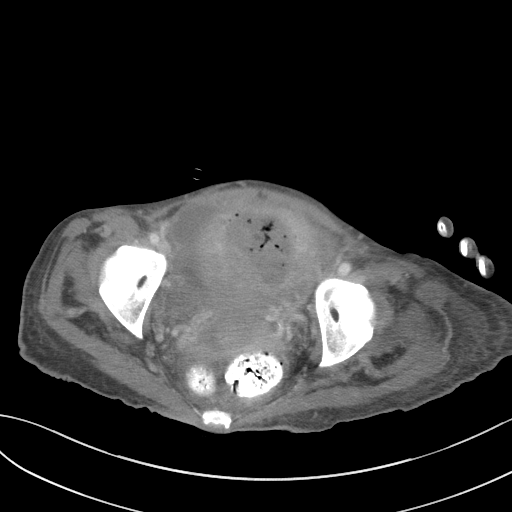
[im 30/98  soft-tissue]
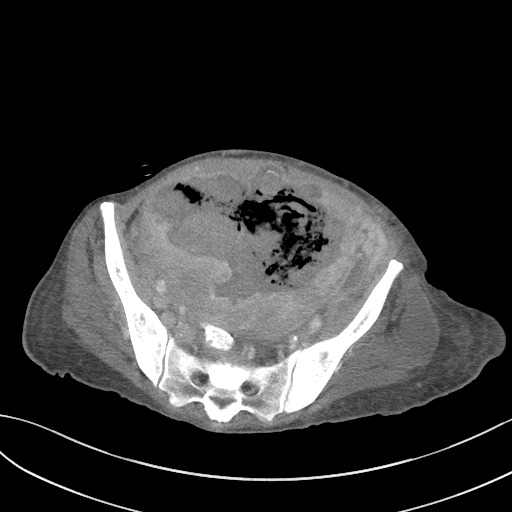
[im 34/98  soft-tissue]
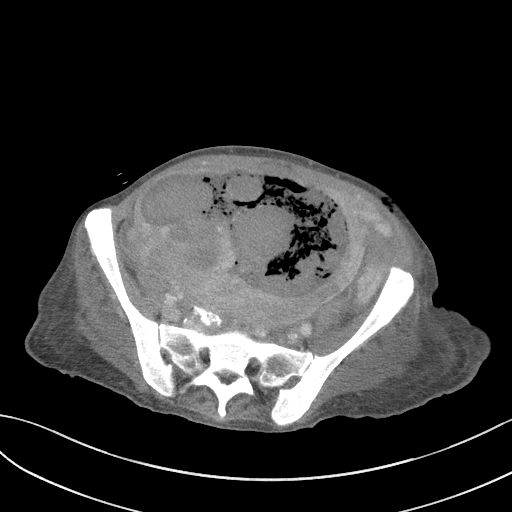
[im 44/98  soft-tissue]
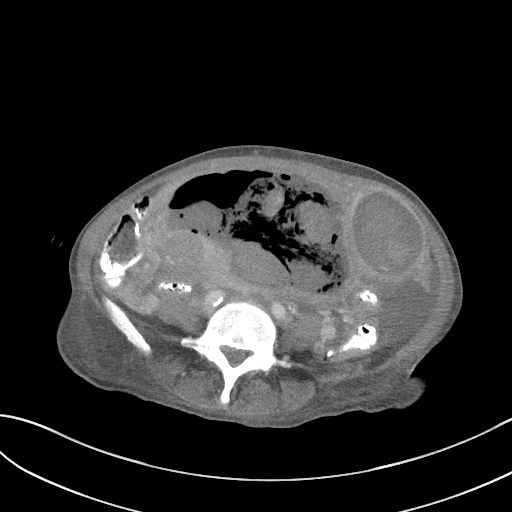
[im 49/98  soft-tissue]
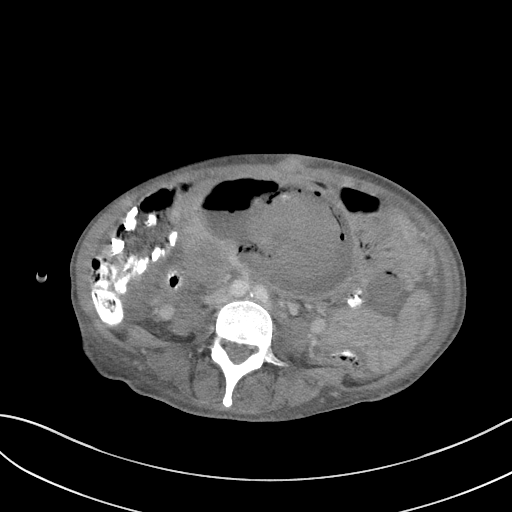
[im 54/98  soft-tissue]
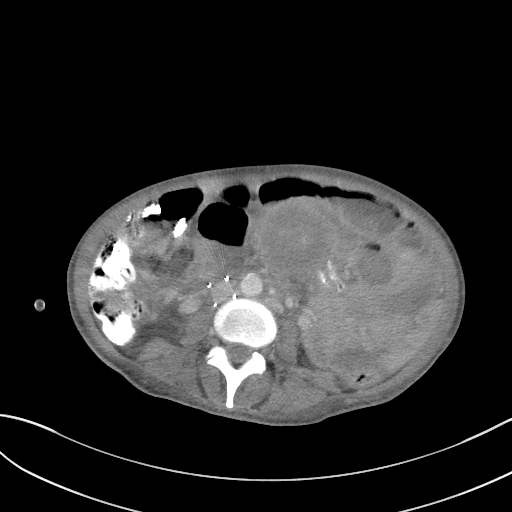
[im 64/98  soft-tissue]
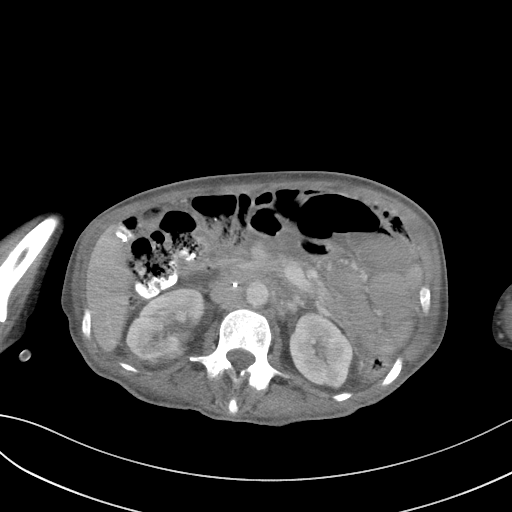
[im 64/98  bone]
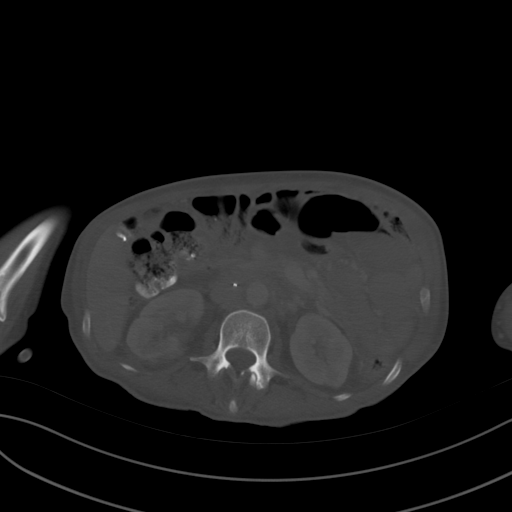
[im 68/98  soft-tissue]
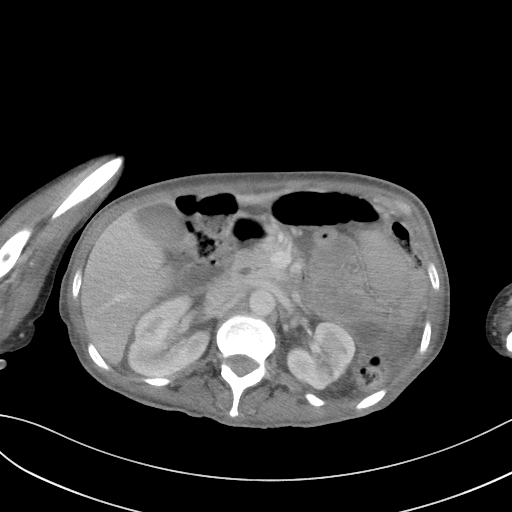
[im 78/98  soft-tissue]
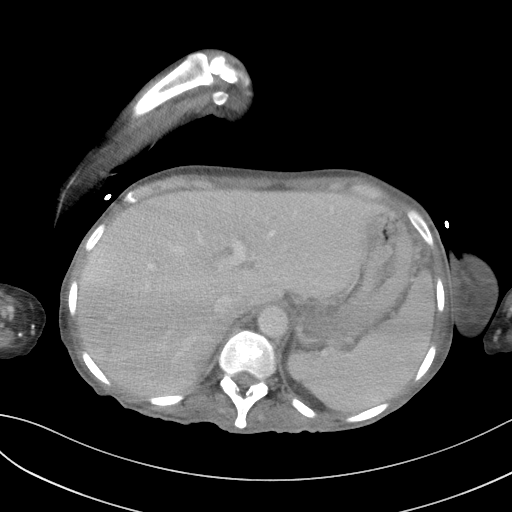
[im 83/98  soft-tissue]
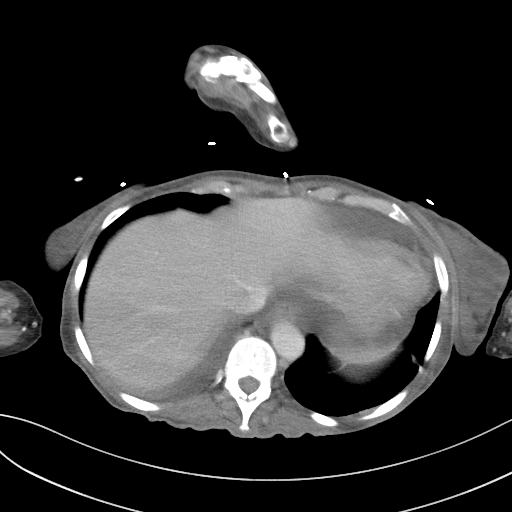
[im 93/98  soft-tissue]
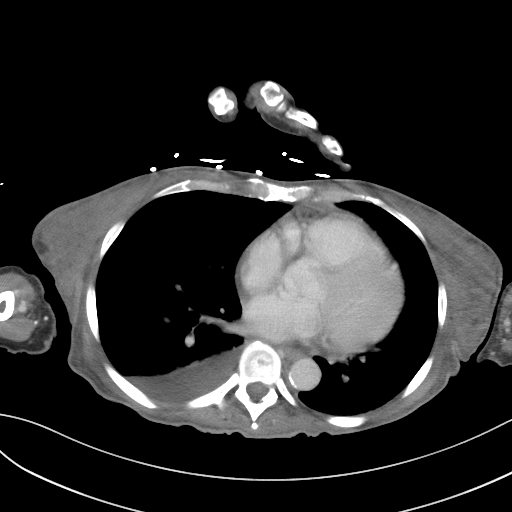

[Series 6: a/p w/ cor · coronal · 0.96mm/px · 3 of 135 slices shown]
[im 45/135  soft-tissue]
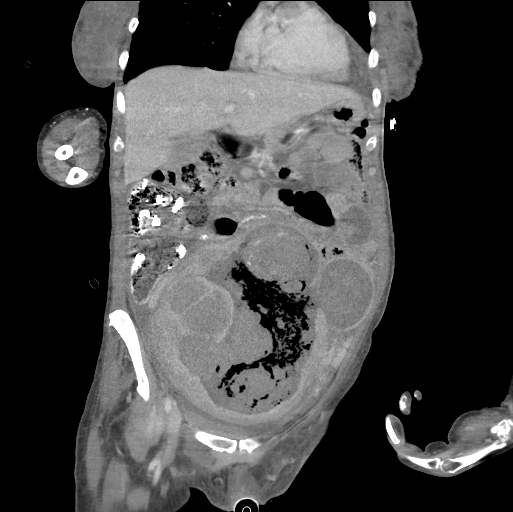
[im 60/135  soft-tissue]
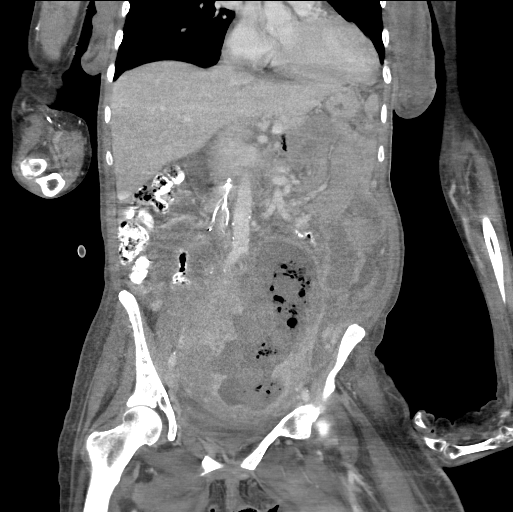
[im 75/135  soft-tissue]
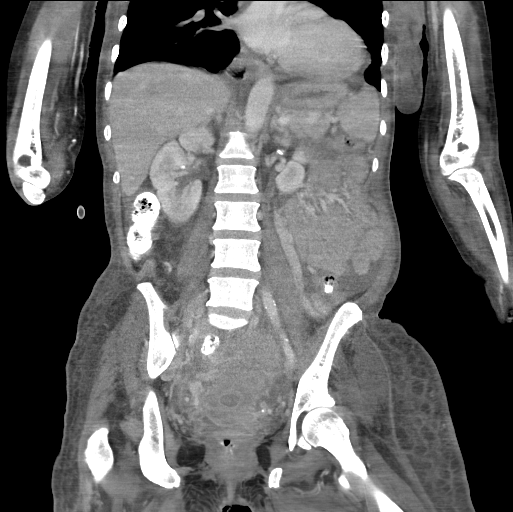

[16 of 46 positions shown; findings below may reference images not displayed]

FINDINGS: Lower chest: Mild atelectasis is seen within the bilateral lung
bases.

A stable small to moderate sized pericardial effusion is noted.

There is a small right pleural effusion.

Hepatobiliary: No focal liver abnormality is seen. No gallstones,
gallbladder wall thickening, or biliary dilatation.

Pancreas: Unremarkable. No pancreatic ductal dilatation or
surrounding inflammatory changes.

Spleen: Normal in size without focal abnormality.

Adrenals/Urinary Tract: Adrenal glands are unremarkable. Kidneys are
normal, without renal calculi, focal lesion, or hydronephrosis. The
urinary bladder is partially contracted and subsequently limited in
evaluation.

Stomach/Bowel: Stomach is within normal limits. The appendix is not
clearly identified. No evidence of bowel wall thickening,
distention, or inflammatory changes.

Vascular/Lymphatic: Aortic atherosclerosis. An inferior vena cava
filter is present.

No enlarged abdominal or pelvic lymph nodes.

Reproductive: Uterus is markedly enlarged. Multiple areas of soft
tissue attenuation are again seen within a markedly distended
endometrial cavity. A marked amount of associated soft tissue air is
also seen. This area is stable in appearance when compared to the
prior study.

Other: Diffuse edematous changes are again seen throughout the
abdominal and pelvic wall.

No abdominopelvic ascites.

Musculoskeletal: No acute or significant osseous findings.
IMPRESSION: 1. Stable findings consistent with multiple intramural uterine
fibroids with additional findings worrisome for endometrial
carcinoma with necrosis. Further correlation with pelvic MRI and
gynecological consultation is recommended.
2. Small right pleural effusion.
3. Stable small to moderate sized pericardial effusion.
4. Inferior vena cava filter.
5. Aortic atherosclerosis.

Aortic Atherosclerosis ([5D]-[5D]).

## 2021-12-11 MED ORDER — HYDROMORPHONE HCL 1 MG/ML IJ SOLN
1.0000 mg | Freq: Once | INTRAMUSCULAR | Status: AC
  Administered 2021-12-11: 18:00:00 1 mg via INTRAVENOUS
  Filled 2021-12-11: qty 1

## 2021-12-11 MED ORDER — HYDROMORPHONE HCL 1 MG/ML IJ SOLN
1.0000 mg | INTRAMUSCULAR | Status: DC | PRN
  Administered 2021-12-11: 11:00:00 1 mg via INTRAVENOUS
  Filled 2021-12-11: qty 1

## 2021-12-11 MED ORDER — IOHEXOL 9 MG/ML PO SOLN
ORAL | Status: AC
  Administered 2021-12-11: 16:00:00 500 mL
  Filled 2021-12-11: qty 1000

## 2021-12-11 MED ORDER — ACETAMINOPHEN 500 MG PO TABS
1000.0000 mg | ORAL_TABLET | Freq: Four times a day (QID) | ORAL | Status: DC
  Administered 2021-12-11: 18:00:00 1000 mg via ORAL
  Filled 2021-12-11 (×2): qty 2

## 2021-12-11 MED ORDER — IOHEXOL 300 MG/ML  SOLN
100.0000 mL | Freq: Once | INTRAMUSCULAR | Status: AC | PRN
  Administered 2021-12-11: 18:00:00 100 mL via INTRAVENOUS

## 2021-12-11 MED ORDER — HYDROMORPHONE HCL 1 MG/ML IJ SOLN
1.0000 mg | INTRAMUSCULAR | Status: DC | PRN
  Administered 2021-12-11 – 2021-12-12 (×5): 1 mg via INTRAVENOUS
  Filled 2021-12-11 (×6): qty 1

## 2021-12-11 MED ORDER — ONDANSETRON HCL 4 MG/2ML IJ SOLN
4.0000 mg | Freq: Three times a day (TID) | INTRAMUSCULAR | Status: DC | PRN
  Administered 2021-12-11: 11:00:00 4 mg via INTRAVENOUS
  Filled 2021-12-11: qty 2

## 2021-12-11 NOTE — Progress Notes (Signed)
Speech Language Pathology Treatment: Dysphagia  Patient Details Name: Holly Bradley MRN: [id number] DOB: [birth year] Today's Date: 12/11/2021 Time: 6948-5462 SLP Time Calculation (min) (ACUTE ONLY): 29 min  Assessment / Plan / Recommendation Clinical Impression  Pt alert and communicating pain this am, with RN notified. Repositioned pt upright in bed and assisted in completion of thorough oral care prior to trials of ice chips and thin liquids. Ice chips x3 and consecutive swallows of thin liquids via cup and straw across x5 trials were without overt s/sx of aspiration. Pt very eager to consume thin liquids and minimal verbal cues required for pt to pace intake as universal precaution. Prolonged mastication observed with regular textures, although were orally cleared given bites of puree. MBS (12/16) revealed sensed aspiration of thin liquids with delayed, but effective cough which cleared material from airway. At time of instrumental, pt presented with reduced alertness and this impact on her performance was considered. Given increased consistency of alertness and clinical presentation this date, recommend advancement to thin liquids. Pt will require supervision during meals for pacing of intake. Will f/u for tolerance and trials of advanced solids.Above discussed with RN.    HPI HPI: Pt is a Holly Bradley admitted 12/03/21 with AMS. MRI brain was negative. EEG 12/13: generalized periodic discharges with triphasic morphology which can be on the ictal-interictal continuum. PMT 12/15: continue current medical interventions with watchful waiting, code status changed to DNR/DNI.  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 Several MBS studies - the last was 06/21/21, which revealed mild dysphagia with oral residue, occasional trace aspiration of thin liquids with strong cough response. Dys3/thin liquids was recommended at D/C. Pt was last  seen by SLP 11/22/21 and a dysphagia 1 diet with nectar thick liquids was recommended at that time. PMH: DVT, HTN, HF with recovered EF, paroxsymal SVT, uterine fibroids.      SLP Plan  Continue with current plan of care;MBS      Recommendations for follow up therapy are one component of a multi-disciplinary discharge planning process, led by the attending physician.  Recommendations may be updated based on patient status, additional functional criteria and insurance authorization.    Recommendations  Diet recommendations: Dysphagia 1 (puree);Thin liquid Liquids provided via: Cup;Straw Medication Administration: Crushed with puree Supervision: Trained caregiver to feed patient;Full supervision/cueing for compensatory strategies Compensations: Minimize environmental distractions;Slow rate;Small sips/bites;Follow solids with liquid Postural Changes and/or Swallow Maneuvers: Seated upright 90 degrees                Oral Care Recommendations: Oral care BID;Staff/trained caregiver to provide oral care Follow Up Recommendations: Skilled nursing-short term rehab (<3 hours/day) Assistance recommended at discharge: Frequent or constant Supervision/Assistance SLP Visit Diagnosis: Dysphagia, oropharyngeal phase (R13.12) Plan: Continue with current plan of care;MBS          [place], [place], [place] Office Number: [place]  12/11/2021, 10:05 AM

## 2021-12-11 NOTE — Progress Notes (Signed)
Pt stated she is interested to learn about hospice service and what this entails.

## 2021-12-11 NOTE — Progress Notes (Signed)
Pharmacy Antibiotic Note  Holly Bradley is a 45 y.o. female admitted on 12/03/2021 with  acute encephalopathy and suspected intra-abdominal infection .  Pharmacy has been consulted for Zosyn dosing.  12/16 CT abdomen: "Marked distension of endometrial cavity which contains multiple masslike soft tissue densities, and a large amount of soft tissue gas which is new since prior exam"  WBC trending down, now at 11.9. Currently afebrile on acetaminophen. AKI improving, Scr down to 0.92.   Plan: Continue Zosyn 3.375 g IV q8h F/u clinical course, LOT     Temp (24hrs), Avg:98.4 F (36.9 C), Min:97.9 F (36.6 C), Max:99.3 F (37.4 C)  Recent Labs  Lab 12/07/21 0149 12/07/21 1755 12/08/21 0711 12/09/21 0211 12/09/21 1916 12/10/21 0142 12/11/21 0121  WBC 22.2*   < > 26.4* 22.0* 20.6* 18.1* 11.9*  CREATININE 1.37*  --  1.17* 1.18*  --  0.98 0.92   < > = values in this interval not displayed.    CrCl cannot be calculated (Unknown ideal weight.).    No Known Allergies  Antimicrobials this admission: Zosyn 12/17 >>   Dose adjustments this admission:  Microbiology results: 12/12 BCx: NG final 12/12 UCx: NG final  12/12 Resp panel: negative  Thank you for allowing pharmacy to be a part of this patients care.  Vance Peper, PharmD PGY1 Pharmacy Resident Phone [phone] 12/11/2021 11:03 AM   Please check AMION for all [place] phone numbers After 10:00 PM, call [place] [phone]

## 2021-12-11 NOTE — Progress Notes (Addendum)
Daily Progress Note   [name]       Date: 12/11/2021 DOB: [birth year]  Age: 45 y.o. MRN#: [id number] Attending Physician: Aldine Contes, MD Primary Care Physician: Mitzi Hansen, MD Admit Date: 12/03/2021  Reason for Consultation/Follow-up: Establishing goals of care, left hemiplegia and dysarthria from prior stroke  Subjective: Received page from attending team - notified patient was interested in hospice services.   Chart review performed. Received report from primary RN - no acute concerns.  Went to visit patient at bedside - no family/visitors present. Patient was lying in bed awake, alert, oriented, and able to participate in conversation - she seems more clear and able to make complex decisions today. Allowed time for patient to respond to questions appropriately as her responses were delayed. She states she is in 8/10 pain in her abdomen. She does not feel well.   Offered to call her brother/Holly Bradley to include in hospice discussion - she was agreeable. Holly Bradley was called and placed on speakerphone.  Reviewed interval history since last PMT visit. Patient is clear she does not wish to pursue surgery for fibroid. Holly Bradley tells her she's being "stubborn." Patient states, "you don't know what I've been through, I'm tired."   Discussed hospice per her request. Provided education and counseling at length on the philosophy and benefits of hospice care. Discussed that it offers a holistic approach to care in the setting of end-stage illness, and is about supporting the patient where they are allowing nature to take it's course. Discussed the hospice team includes RNs, physicians, social workers, and chaplains. They can provide personal care, support for the family, and help  keep patient out of the hospital as well as assist with DME needs for home hospice. Education provided on the difference between home vs residential hospice. She would be appropriate for home hospice due to her low albumin and poor functional status. Patient does not feel she has a good quality of life and is interested in hospice care - she does not want to be rehospitalized once discharged.   [name] is upset that patient is interested in hospice as he wants her to "keep fighting." Reviewed poor functional status with low albumin to be poor prognostic indicators in addition to risk factors for complications such as infections. Reviewed that she is at high risk  of rehospitalization without hospice services. Patient smiled at the idea of hospice throughout conversation. She is clear she does not want her life prolonged, but would want to live as best she can, feel as best she can, letting nature take it's course with hospice. Even though [name] is understandably upset, he is overall supportive of patient's decision. He laments having to tell family. Offered support to discuss with family - PMT will call Holly Bradley tomorrow to schedule family meeting.  We talked about transition to comfort measures in house and what that would entail inclusive of medications to control pain, dyspnea, agitation, nausea, and itching. We discussed stopping all unnecessary measures such as blood draws, needle sticks, oxygen, antibiotics, CBGs/insulin, cardiac monitoring, IVF, blood transfusions, and frequent vital signs. Patient is agreeable to full comfort care in house today. Her pain is her biggest concern. She is also worried about going for CT scan today. I feel the best course of action is discontinue CT scan order and focus on getting her pain better controlled before stopping other interventions. Once her pain is better controlled and hospice has done evaluation, if patient is still agreeable to full comfort care, will proceed at that  time with stopping antibiotics.  Patient confirms DNR/DNI.  All questions and concerns addressed. Encouraged to call with questions and/or concerns. PMT card provided.   Length of Stay: 7  Current Medications: Scheduled Meds:   aspirin  81 mg Oral Daily   atorvastatin  40 mg Oral Daily   carvedilol  25 mg Oral BID WC   cloNIDine  0.1 mg Oral TID   heparin  5,000 Units Subcutaneous Q8H   hydrALAZINE  100 mg Oral TID   isosorbide mononitrate  60 mg Oral Daily   mouth rinse  15 mL Mouth Rinse BID   pantoprazole  40 mg Oral Daily   senna-docusate  2 tablet Oral BID    Continuous Infusions:  piperacillin-tazobactam (ZOSYN)  IV 3.375 g (12/11/21 1420)    PRN Meds: acetaminophen **OR** acetaminophen, HYDROmorphone (DILAUDID) injection, ondansetron (ZOFRAN) IV  Physical Exam Vitals and nursing note reviewed.  Constitutional:      General: She is not in acute distress.    Appearance: She is ill-appearing.  Pulmonary:     Effort: No respiratory distress.  Skin:    General: Skin is warm and dry.  Neurological:     Mental Status: She is alert and oriented to person, place, and time.     Motor: Weakness present.  Psychiatric:        Attention and Perception: Attention normal.        Speech: Speech is delayed.        Behavior: Behavior is slowed. Behavior is cooperative.        Cognition and Memory: Cognition and memory normal.            Vital Signs: BP 129/76 (BP Location: Right Arm)    Pulse 88    Temp 98.5 F (36.9 C) (Oral)    Resp 16    SpO2 97%  SpO2: SpO2: 97 % O2 Device: O2 Device: Room Air O2 Flow Rate:    Intake/output summary:  Intake/Output Summary (Last 24 hours) at 12/11/2021 1628 Last data filed at 12/11/2021 0323 Gross per 24 hour  Intake 303.75 ml  Output 350 ml  Net -46.25 ml   LBM: Last BM Date: 12/09/21 Baseline Weight:   Most recent weight:        Palliative Assessment/Data: PPS 20-30%  Patient Active Problem List    Diagnosis Date Noted   Hypercalcemia 12/04/2021   Acute encephalopathy 12/03/2021   Weakness 11/22/2021   Pressure injury of skin 10/13/2021   Lupus anticoagulant positive    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)    Acute blood loss anemia 10/10/2021   Hypertension    Non-ischemic cardiomyopathy (HCC)    Swelling of left hand    Proctocolitis    Constipation    Uterine fibroid    Left hemiparesis (HCC)    Hemorrhagic stroke (HCC)    Abnormal uterine bleeding (AUB)    Staphylococcus epidermidis bacteremia    Iron deficiency anemia due to chronic blood loss 05/25/2021   Cerebral edema (HCC) 05/25/2021   Chronic HFrEF (heart failure with reduced ejection fraction) ([place]) 05/25/2021   Pneumonia due to methicillin susceptible Staphylococcus aureus ([place]) 05/25/2021   Acute respiratory failure with hypoxia (HCC)    Acute right MCA stroke ([place]) 05/15/2021   Uncontrolled hypertension 05/15/2021   Cerebrovascular accident (CVA) ([place]) 05/15/2021   IDA (iron deficiency anemia) 03/01/2021   Healthcare maintenance 03/01/2021   Onychomycosis 03/01/2021   Food insecurity 02/02/2021   Psychosis ([place])    Fibroid 01/11/2021   Menorrhagia 01/11/2021   Acute exacerbation of CHF (congestive heart failure) ([place]) 01/10/2021   AKI (acute kidney injury) ([place])    Symptomatic anemia    SVT (supraventricular tachycardia) ([place]) 05/01/2020   CHF (congestive heart failure) ([place]) 05/01/2020    Palliative Care Assessment & Plan   Patient Profile: 45 y.o. female  with history of right MCA 2022 04/2021 with thrombectomy, intubation, trach and decannulation, and discharge to [facility] 06/2021.  Additional history includes of DVT, chronic heart failure with recovered EF, paroxysmal SVT, hypertension, and uterine fibroids s/p embolization. She presented to the emergency department on 12/03/2021 with altered mental status. Patient resides at [facility] and facility staff noted change in mental status from baseline. In the ED, she was found to be febrile at 100.4 with labs significant for leukocytosis of 17.6 and elevated creatinine of 2.5. Chest x-ray was unremarkable. CT head shows no evidence of acute intracranial abnormalities. Admitted to IMTS for further evaluation of acute encephalopathy.  12/13 - EEG negative for seizures and is suggestive of moderate diffuse encephalopathy.  Assessment: Acute encephalopathy likely secondary to hypercalcemia vs infection Normocytic blood loss anemia AKI Dehydration Aphasia Left sided weakness Concern for endometriosis  Recommendations/Plan: Patient states she is ready for transition to full comfort care today - will focus on getting her pain better controlled, allow hospice to do their evaluation, and follow up to ensure these are still patient's wishes before stopping antibiotics Discontinue CT for now  Scheduled acetaminophen 1000mg  q6hr Dilaudid adjusted to 1mg  q2h PRN pain. Ordered one time dose now Patient is clear she does not want to pursue surgery/invasive procedures Continue DNR/DNI TOC consult placed for: home hospice referral at [place] PMT will continue to follow and support holistically  Goals of Care and Additional Recommendations: Limitations on Scope of Treatment: Minimize Medications, No Artificial Feeding, No Surgical Procedures, and No Tracheostomy  Code Status:    Code Status Orders  (From admission, onward)           Start     Ordered   12/06/21 1623  Do not attempt resuscitation (DNR)  Continuous       Question Answer Comment  In the event of cardiac or respiratory ARREST Do not call a code  blue   In the event of cardiac or respiratory ARREST Do not perform Intubation, CPR, defibrillation or ACLS   In the event of cardiac or respiratory ARREST Use medication by any route, position, wound care, and other measures to relive pain and suffering. May use oxygen,  suction and manual treatment of airway obstruction as needed for comfort.      12/06/21 1622           Code Status History     Date Active Date Inactive Code Status Order ID Comments User Context   12/03/2021 2016 12/06/2021 1622 Full Code [id number]  Mike Craze, DO ED   11/22/2021 0435 11/25/2021 0029 Full Code [id number]  Rick Duff, MD ED   10/10/2021 0313 10/27/2021 1655 Partial Code [id number]  Rise Patience, MD Inpatient   05/30/2021 1101 07/04/2021 0522 Partial Code [id number]  Philis Pique, NP Inpatient   05/15/2021 0754 05/30/2021 1101 Full Code [id number]  Greta Doom, MD ED   02/01/2021 1644 02/02/2021 2005 Full Code [id number]  Ledora Bottcher, [place] ED   01/10/2021 1455 01/27/2021 2035 Full Code [id number]  Marty Heck, DO ED   05/01/2020 1644 05/03/2020 1813 Full Code [id number]  Lequita Halt, MD ED       Prognosis:  < 6 months  Discharge Planning: [place] with Hospice  Care plan was discussed with primary RN, patient, patient's brother, attending team, [facility]  Thank you for allowing the Palliative Medicine Team to assist in the care of this patient.   Total Time 70 minutes Prolonged Time Billed  yes       Greater than 50%  of this time was spent counseling and coordinating care related to the above assessment and plan.  Lin Landsman, NP  Please contact Palliative Medicine Team phone at [phone] for questions and concerns.

## 2021-12-11 NOTE — Progress Notes (Signed)
Patient ID: Holly Bradley, female   DOB: 1976-02-06, 45 y.o.   MRN: [id number]     [facility] Attending Consult Followup Note  Subjective:  Patient reports ongoing abdominal pain. Minimal vaginal discharge noted, no active vaginal bleeding.  Hemoglobin is stable at 7.9. Patient desperately wants to go home as she misses her family.   Objective:  Blood pressure (!) 169/94, pulse 73, temperature 98.1 F (36.7 C), temperature source Oral, resp. rate 16, SpO2 96 %. Gen: NAD HENT: Normocephalic, atraumatic Lungs: Normal respiratory effort Heart: Regular rate noted Abdomen: fibroid uterus palpated, mild TTP, no peritoneal signs Pelvic: scant brown discharge noted on Purewick, no active bleeding Ext: 2+ DTRs, no edema, no cyanosis, negative Homan's sign  Labs: CBC Latest Ref Rng & Units 12/11/2021 12/10/2021 12/09/2021  WBC 4.0 - 10.5 K/uL 11.9(H) 18.1(H) 20.6(H)  Hemoglobin 12.0 - 15.0 g/dL 7.9(L) 8.2(L) 7.4(L)  Hematocrit 36.0 - 46.0 % 24.6(L) 24.9(L) 22.5(L)  Platelets 150 - 400 K/uL 404(H) 347 360   CMP Latest Ref Rng & Units 12/11/2021 12/10/2021 12/09/2021  Glucose 70 - 99 mg/dL 82 96 90  BUN 6 - 20 mg/dL 36(H) 44(H) 52(H)  Creatinine 0.44 - 1.00 mg/dL 0.92 0.98 1.18(H)  Sodium 135 - 145 mmol/L 143 144 144  Potassium 3.5 - 5.1 mmol/L 4.1 4.6 3.4(L)  Chloride 98 - 111 mmol/L 113(H) 114(H) 112(H)  CO2 22 - 32 mmol/L 22 23 23   Calcium 8.9 - 10.3 mg/dL 10.3 10.6(H) 10.8(H)  Total Protein 6.5 - 8.1 g/dL 6.4(L) 6.9 6.8  Total Bilirubin 0.3 - 1.2 mg/dL 1.0 1.2 1.1  Alkaline Phos 38 - 126 U/L 133(H) 149(H) 151(H)  AST 15 - 41 U/L 15 14(L) 13(L)  ALT 0 - 44 U/L 8 9 9     Assessment & Plan:  No signs of active bleeding from uterus.  Abdominal pain more than likely secondary to degenerating fibroid WBC continue to normalize Will continue to monitor.   Continue Zosyn and analgesia  Continue care as per primary team, appreciate their care for this mutual patient.    We will continue to follow along.   Mora Bellman, MD, [place] for [facility], [place] Phone: [phone] (M-F, [phone]) & [phone] (Off hours, weekends, holidays)

## 2021-12-11 NOTE — Progress Notes (Addendum)
Subjective: No acute events or concerns overnight.  Patient shakes her head so she is not feeling well today.  Having lower abdominal pain.  Feeling nauseous this morning.  Not able to eat breakfast this morning.  Objective:  Vital signs in last 24 hours: Vitals:   12/10/21 2251 12/11/21 0341 12/11/21 0746 12/11/21 1148  BP: (!) 149/81 140/83 (!) 169/94 105/61  Pulse: 73 80 73 80  Resp: 16 18 16 17   Temp: 98.3 F (36.8 C) 99.3 F (37.4 C) 98.1 F (36.7 C) 97.6 F (36.4 C)  TempSrc: Oral Oral Oral Oral  SpO2: 99% 98% 96% 97%   General Physical Exam: Constitutional: chronically ill-appearing, in no acute distress.   HENT: normocephalic, atraumatic Eyes: conjunctiva non-erythematous, without scleral icterus Cardiovascular: regular rate and rhythm, no m/r/g Pulmonary/Chest: normal work of breathing on room air, Lungs CTAB  Abdominal: TTP epigastric area and lower abdomen, firm masses felt lower abdomen with mild distention Neurological: Awake, alert, consistently answers questions with short answers/ asks questions, shakes head yes no Skin: warm and dry, no rashes or lesions on exposed surfaces Psych: normal behavior, blunted affect   Assessment/Plan:  Principal Problem:   Acute encephalopathy Active Problems:   AKI (acute kidney injury) ([place])   Hypertension   Pressure injury of skin   Hypercalcemia  Pt is a 45 year old female with 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    #Acute infectious process, concern for acute intra-abdominopelvic infection #Acute blood loss anemia, likely 2/2 vaginal bleeding White count downtrending from 26, febrile this admission.  Endorsing lower abdominal/pelvic pain with report of  foul-smelling dark vaginal discharge.  Acute blood loss anemia requiring 2 unit RBCs this admission so far.  Given bleeding and lower abdominal pain, CT abdomen pelvis was obtained which showed multiple intramural fibroids, multiple masslike soft tissue structures with evidence of soft tissue gas new from prior.  Radiology differential included endometrial carcinoma with necrosis versus infected incarcerated fibroid. On broad abx for acute intra-abdominopelvic infection. Gynecology consulted, possibly late onset endometritis following [place] . Pain discharge, low grade fever, leukocytosis likely related to degenerating fibroid. Should expect abdominal pain, discharge to continue as fibroids degenerate, usually pain managed with NSAIDs per GYN. Hemoglobin stable today.  No current vaginal bleeding. Will need to continue to monitor hemoglobin to ensure stability.  Leukocytosis improving on ABX.  No iron deficiency.  Orts abdominal pain and nausea today.  Plan to reimage today. Plan: -Gynecology consult, appreciate recommendations -Repeat CT abdomen pelvis w -IV Zosyn per pharmacy, started 12/17 -IV Zofran as needed total 5 days only, given concern for QT prolongation -Trend CBC -Transfuse if hemoglobin < 7  #Moderate hypercalcemia, improving Patient noted to be hypercalcemic this admission as well as last admission 3 to 4 weeks ago.  Is having encephalopathy and abdominal pain which may be worsened by hypercalcemia.  S/p half liter LR bolus with maintenance fluids.  Patient does have history of HFpEF, will need to be mindful with fluids.  Corrected calcium today 12.1, improving.  PTH low normal, suggesting non-PTH mediated hypercalcemia, which can occur secondary to malignancy, vitamin D intoxication, granulomatous inflammatory process, bone resorption seen in immobilization. Vit D normal.  It is possible PTH is inappropriately low normal and should be even lower given hypercalcemia, in this  case concern for  primary hyperparathyroidism.   Plan: -PTH related peptide pending -Continue to trend corrected calcium  #Acute encephalopathy, resolved Intracranial imaging negative for acute process.  Suspect due to dehydration, acute infectious or inflammatory process see above.  Patient also has hypercalcemia ongoing since previous admission, which may have contributed to encephalopathy.  At neurologic baseline. Plan: -Management of acute infectious process as above -Management of moderate hypercalcemia as above -PT/OT/SLP eval and treat -Dysphagia 1 diet  #Mild Hypokalemia, resolved Potassium low this admission, electrolytes were repleted as needed. Plan: -Continue to monitor electrolytes  #Prolonged QT, resolved #Paroxysmal SVT On 12/14, pt had prolonged QT of 550 to 600. An EKG showed NSR with corrected QT of 503.  Patient has history of paroxysmal SVT for which she takes Amiodarone 200 mg daily.  Also on Lexapro 20 mg daily. Both are QT prolonging agents. Plan: -Holding Amiodarone and Lexapro in the setting of QT prolongation -Telemetry monitoring  #Acute kidney injury likely 2/2 dehydration, improving Serum creatinine 2.04 on admission (baseline of ~0.7).  AKI likely secondary to dehydration, kidney function improving.  Creatinine today 0.92. GFR >60 Plan: -Trend BMP -Avoid nephrotoxic agents  #Palliative Care Consult Goals of care discussed with patient and brother. Pt consented to DNR/DNI status.  Patient interested in learning more about hospice.  Discussed on 12/20 with palliative care.  Palliative will educate about hospice care, reports patient might qualify based on low albumin 1.8.  Discussed concerns about quality of life with degenerating fibroids without plan for hysterectomy.  #Hx LLE DVT Patient previously on Eliquis which was discontinued in the setting of recent uterine bleed (October 2022).  An IVC filter was placed 10/2021 to prevent PE since patient unable to take  anticoagulant. Patient is status post uterine artery embolization.  Needs follow-up for in Feb-March with vascular surgery to discuss resuming anticoagulation versus removing IVC filter, and reassess status of DVT in left leg. 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  Appears to be at neurologic baseline given recent improvement in communication and command following.  Follows with Dr. Leonie Man in stroke clinic. Plan: -Continue aspirin 81 mg daily -Continue atorvastatin 20 mg daily   #Hypertension Home medications: Carvedilol 25 mg twice daily, clonidine 0.1 mg 3 times daily, hydralazine 100 mg 3 times daily, Imdur 60 mg daily. Patient was restarted on antihypertensives for elevated blood pressures.  BPs appropriate.  Holding Entresto in the setting of AKI. Plan: -Restarted Clonidine 0.1  TID -Restarted Carvedilol 25 mg BID -Restarted Hydralazine 100mg  TID -Restarted Imdur 60mg  daily    #HF with recovered EF Recent echo in May 2022 revealed EF of 55-60% with grade II diastolic dysfunction and severe left atrial enlargement.  Euvolemic to dry on exam. Plan: -Holding torsemide 20 mg twice daily given dehydration, AKI, hypercalcemia -Restarted beta-blocker, Imdur, hydralazine, clonidine -Holding Entresto in the setting of AKI  Other home medications continued this admission: Protonix 40 mg QD, senokot s  Diet: Dysphagia 1 IVF: 75 cc/hour LR 10 hours VTE: Heparin Code: Full  Prior to Admission Living Arrangement: [place] [facility] Anticipated Discharge Location: [facility] versus home hospice Barriers to Discharge: Infectious workup and clinical improvement Dispo: Anticipated discharge in approximately 2-3 day(s).  Portions of this report may have been transcribed using voice recognition software. Every effort was made to ensure  accuracy; however, inadvertent computerized transcription errors may  be present.   Wayland Denis, MD 12/11/21,  1:23 PM Pager: [phone] Internal Medicine Resident, PGY-1 [facility]

## 2021-12-12 ENCOUNTER — Encounter (HOSPITAL_COMMUNITY): Payer: Self-pay | Admitting: Internal Medicine

## 2021-12-12 ENCOUNTER — Other Ambulatory Visit (HOSPITAL_COMMUNITY): Payer: Self-pay

## 2021-12-12 LAB — RESP PANEL BY RT-PCR (FLU A&B, COVID) ARPGX2
Influenza A by PCR: NEGATIVE
Influenza B by PCR: NEGATIVE
SARS Coronavirus 2 by RT PCR: NEGATIVE

## 2021-12-12 MED ORDER — HYDROMORPHONE HCL 1 MG/ML PO LIQD: 1.0000 mg | ORAL | 0 refills | Status: DC | PRN

## 2021-12-12 MED ORDER — ACETAMINOPHEN 500 MG PO TABS
1000.0000 mg | ORAL_TABLET | Freq: Three times a day (TID) | ORAL | 0 refills | Status: AC
  Filled 2021-12-12: qty 30, 5d supply, fill #0

## 2021-12-12 MED ORDER — HYDROMORPHONE HCL 1 MG/ML IJ SOLN
0.5000 mg | INTRAMUSCULAR | Status: DC | PRN
  Administered 2021-12-13 (×2): 0.5 mg via INTRAVENOUS
  Filled 2021-12-12 (×2): qty 0.5

## 2021-12-12 NOTE — Plan of Care (Signed)

## 2021-12-12 NOTE — Progress Notes (Signed)
Speech Language Pathology Treatment: Dysphagia  Patient Details Name: Holly Bradley MRN: [id number] DOB: July 16, 1976 Today's Date: 12/12/2021 Time: 1206-1230 SLP Time Calculation (min) (ACUTE ONLY): 24 min  Assessment / Plan / Recommendation Clinical Impression  Pt seen for follow-up dysphagia treatment to assess tolerance of thin liquids and trial advanced solids to determine LRD. Thin liquids via self-fed straw sips were significant for overt coughing in 2/7 trials. Cues for small sips via straw did not eliminate s/sx. With self-fed sips via cup only, pt required infrequent verbal cues for small sips at slow rate. No overt s/sx of aspiration noted across x5 trials. Dys 1 and simulated dys 2 solids consumed without difficulty and complete oral clearance noted. Recommend dys 2, thin liquid diet (no straws) with adherence to given strategies. SLP to f/u briefly for tolerance and if pt is appropriate for further advancement of diet. Above discussed with RN.    HPI HPI: Pt is a 45 y/o female admitted 12/03/21 with AMS. MRI brain was negative. EEG 12/13: generalized periodic discharges with triphasic morphology which can be on the ictal-interictal continuum. PMT 12/15: continue current medical interventions with watchful waiting, code status changed to DNR/DNI.  Hx of right MCA CVA 04/2021 with thrombectomy, multiple intubations, trach and decannulation, D/Cd to [facility] 7/22. Several MBS studies - the last was 06/21/21, which revealed mild dysphagia with oral residue, occasional trace aspiration of thin liquids with strong cough response. Dys3/thin liquids was recommended at D/C. Pt was last seen by SLP 11/22/21 and a dysphagia 1 diet with nectar thick liquids was recommended at that time. PMH: DVT, HTN, HF with recovered EF, paroxsymal SVT, uterine fibroids.      SLP Plan  Continue with current plan of care;MBS      Recommendations for follow up therapy are one component of a multi-disciplinary discharge  planning process, led by the attending physician.  Recommendations may be updated based on patient status, additional functional criteria and insurance authorization.    Recommendations  Diet recommendations: Dysphagia 2 (fine chop);Thin liquid Liquids provided via: Cup;No straw Medication Administration: Crushed with puree Supervision: Trained caregiver to feed patient;Full supervision/cueing for compensatory strategies Compensations: Minimize environmental distractions;Slow rate;Small sips/bites;Follow solids with liquid Postural Changes and/or Swallow Maneuvers: Seated upright 90 degrees                Oral Care Recommendations: Oral care BID;Staff/trained caregiver to provide oral care Follow Up Recommendations: Skilled nursing-short term rehab (<3 hours/day) Assistance recommended at discharge: Frequent or constant Supervision/Assistance SLP Visit Diagnosis: Dysphagia, oropharyngeal phase (R13.12) Plan: Continue with current plan of care;MBS          Ellwood Dense, [place], [place] Office Number: [place]  12/12/2021, 12:45 PM

## 2021-12-12 NOTE — Progress Notes (Signed)
Patient ID: Holly Bradley, female   DOB: Apr 27, 1976, 45 y.o.   MRN: [id number]     [facility]ulty [facility] OB/GYN Attending Consult Followup Note  Subjective:  Patient reports abdominal pain unchanged from previous. No active vaginal bleeding.  Patient desperately wants to go home.   Objective:  Blood pressure (!) 156/78, pulse 76, temperature 99.1 F (37.3 C), temperature source Oral, resp. rate 14, SpO2 97 %. Gen: NAD HENT: Normocephalic, atraumatic Lungs: Normal respiratory effort Heart: Regular rate noted Abdomen: fibroid uterus palpated, mild TTP, no peritoneal signs Pelvic: scant brown discharge noted on Purewick, no active bleeding Ext: 2+ DTRs, no edema, no cyanosis, negative Homan's sign  Labs: CBC Latest Ref Rng & Units 12/11/2021 12/10/2021 12/09/2021  WBC 4.0 - 10.5 K/uL 11.9(H) 18.1(H) 20.6(H)  Hemoglobin 12.0 - 15.0 g/dL 7.9(L) 8.2(L) 7.4(L)  Hematocrit 36.0 - 46.0 % 24.6(L) 24.9(L) 22.5(L)  Platelets 150 - 400 K/uL 404(H) 347 360   CMP Latest Ref Rng & Units 12/11/2021 12/10/2021 12/09/2021  Glucose 70 - 99 mg/dL 82 96 90  BUN 6 - 20 mg/dL 36(H) 44(H) 52(H)  Creatinine 0.44 - 1.00 mg/dL 0.92 0.98 1.18(H)  Sodium 135 - 145 mmol/L 143 144 144  Potassium 3.5 - 5.1 mmol/L 4.1 4.6 3.4(L)  Chloride 98 - 111 mmol/L 113(H) 114(H) 112(H)  CO2 22 - 32 mmol/L 22 23 23   Calcium 8.9 - 10.3 mg/dL 10.3 10.6(H) 10.8(H)  Total Protein 6.5 - 8.1 g/dL 6.4(L) 6.9 6.8  Total Bilirubin 0.3 - 1.2 mg/dL 1.0 1.2 1.1  Alkaline Phos 38 - 126 U/L 133(H) 149(H) 151(H)  AST 15 - 41 U/L 15 14(L) 13(L)  ALT 0 - 44 U/L 8 9 9     Assessment & Plan:  No signs of active bleeding from uterus.  Abdominal pain secondary to degenerating fibroid Will plan to see patient outpatient for ongoing care as fibroids continues to degenerate Continue pain management   Mora Bellman, MD, [place], [facility] for [place], [place] Phone: [phone] (M-F, 0800-1700) & [phone] (Off hours, weekends, holidays)

## 2021-12-12 NOTE — TOC Transition Note (Signed)
Transition of Care [facility]) - CM/SW Discharge Note   Patient Details  Name: Makinzie Considine MRN: [id number] Date of Birth: 18-Jul-1976  Transition of Care [facility]) CM/SW Contact:  Benard Halsted, LCSW Phone Number: 12/12/2021, 3:10 PM   Clinical Narrative:    Patient will DC to: [facility]  Anticipated DC date: 12/12/21 Family notified: Brother at [employer] by: Corey Harold   Per MD patient ready for DC to [facility]. RN to call report prior to discharge [phone]). RN, patient, patient's family, hospice, and facility notified of DC. Discharge Summary and FL2 sent to facility. DC packet on chart. Ambulance transport requested for patient.   CSW will sign off for now as social work intervention is no longer needed. Please consult [place] again if new needs arise.     Final next level of care: Skilled Nursing Facility Barriers to Discharge: Barriers Resolved   Patient Goals and CMS Choice Patient states their goals for this hospitalization and ongoing recovery are:: Return to snf w/hospice CMS Medicare.gov Compare Post Acute Care list provided to:: Patient Choice offered to / list presented to : Patient  Discharge Placement   Existing PASRR number confirmed : 12/12/21          Patient [facility] bed at: [facility] Patient to be transferred to facility by: [place] Name of family member notified: Patient notified brother Patient and family notified of of transfer: 12/12/21  Discharge Plan and Services In-house Referral: Clinical Social Work   Post Acute Care Choice: [place], Hospice                               Social Determinants of Health (SDOH) Interventions     Readmission Risk Interventions No flowsheet data found.

## 2021-12-12 NOTE — Progress Notes (Signed)
This chaplain responded to PMT consult for emotional support.  The chaplain met the Pt. brother-[name] outside the Pt. room. The visit allowed the space for reflective listening with [name] as he expresses his anticipatory grief in the Pt. decision for Hospice.    The chaplain moved to the Pt. bedside with [name] and offered emotional support to both, as the Pt. tearfully told her brother "I am tired."  [name] communicated his message of sadness and support with permission for the Pt. to make her own decisions. [name] is contacting family and friends. [name] has questions about "time" the family may have together. The chaplain referred the question to the PMT NP-Amber.  The Pt. accepted the chaplain's invitation for prayer and F/U spiritual care.  Chaplain Sallyanne Kuster [phone]

## 2021-12-12 NOTE — Progress Notes (Signed)
Report given to [place] at [facility]. Will premedicate patient before discharge for transport.

## 2021-12-12 NOTE — NC FL2 (Signed)
[place] MEDICAID FL2 LEVEL OF CARE SCREENING TOOL     IDENTIFICATION  [name] Birthdate: Sep 20, 1976 Sex: female Admission Date (Current Location): 12/03/2021  [facility] and [place] Number:  Herbalist and Address:  [facility] [place], [place] [address], [place], [place] [address]      Provider Number: [id number]  Attending Physician Name and Address:  Aldine Contes, MD  Relative Name and Phone Number:       Current Level of Care: Hospital Recommended Level of Care: [place] Prior Approval Number:    Date Approved/Denied:   PASRR Number: [id number]  Discharge Plan: SNF    Current Diagnoses: Patient Active Problem List   Diagnosis Date Noted   Hypercalcemia 12/04/2021   Acute encephalopathy 12/03/2021   Weakness 11/22/2021   Pressure injury of skin 10/13/2021   Lupus anticoagulant positive    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)    Acute blood loss anemia 10/10/2021   Hypertension    Non-ischemic cardiomyopathy (HCC)    Swelling of left hand    Proctocolitis    Constipation    Uterine fibroid    Left hemiparesis ([place])    Hemorrhagic stroke (HCC)    Abnormal uterine bleeding (AUB)    Staphylococcus epidermidis bacteremia    Iron deficiency anemia due to chronic blood loss 05/25/2021   Cerebral edema (HCC) 05/25/2021   Chronic HFrEF (heart failure with reduced ejection fraction) ([place]) 05/25/2021   Pneumonia due to methicillin susceptible Staphylococcus aureus ([place]) 05/25/2021   Acute respiratory failure with hypoxia (HCC)    Acute right MCA stroke ([place]) 05/15/2021   Uncontrolled hypertension 05/15/2021   Cerebrovascular accident (CVA) ([place]) 05/15/2021   IDA (iron deficiency anemia) 03/01/2021   Healthcare maintenance 03/01/2021   Onychomycosis 03/01/2021   Food insecurity 02/02/2021   Psychosis ([place])    Fibroid 01/11/2021   Menorrhagia 01/11/2021   Acute  exacerbation of CHF (congestive heart failure) ([place]) 01/10/2021   AKI (acute kidney injury) ([place])    Symptomatic anemia    SVT (supraventricular tachycardia) ([place]) 05/01/2020   CHF (congestive heart failure) ([place]) 05/01/2020    Orientation RESPIRATION BLADDER Height & Weight     Self, Time, Situation, Place  Normal Incontinent, External catheter Weight: 140 lb 14 oz (63.9 kg) Height:  5\' 5"  (165.1 cm)  BEHAVIORAL SYMPTOMS/MOOD NEUROLOGICAL BOWEL NUTRITION STATUS      Continent Diet (see dc summary)  AMBULATORY STATUS COMMUNICATION OF NEEDS Skin   Extensive Assist Verbally PU Stage and Appropriate Care (unstageable on heel; stage II on buttocks)                       Personal Care Assistance Level of Assistance  Bathing, Feeding, Dressing Bathing Assistance: Maximum assistance Feeding assistance: Limited assistance Dressing Assistance: Limited assistance     Functional Limitations Info             SPECIAL CARE FACTORS FREQUENCY                       Contractures Contractures Info: Not present    Additional Factors Info  Code Status, Allergies, Psychotropic Code Status Info: DNR Allergies Info: NKA Psychotropic Info: Clonodine         Current Medications (12/12/2021):  This is the current hospital active medication list Current Facility-Administered Medications  Medication Dose Route Frequency Provider Last Rate Last Admin   acetaminophen (  TYLENOL) tablet 1,000 mg  1,000 mg Oral Q6H Elmer Picker M, NP   1,000 mg at 12/11/21 1825   aspirin chewable tablet 81 mg  81 mg Oral Daily Virl Axe, MD   81 mg at 12/12/21 0925   atorvastatin (LIPITOR) tablet 40 mg  40 mg Oral Daily Virl Axe, MD   40 mg at 12/12/21 0925   carvedilol (COREG) tablet 25 mg  25 mg Oral BID WC Virl Axe, MD   25 mg at 12/12/21 1610   cloNIDine (CATAPRES) tablet 0.1 mg  0.1 mg Oral TID Virl Axe, MD   0.1 mg at 12/12/21 0925   heparin injection 5,000 Units  5,000  Units Subcutaneous Q8H Rehman, Areeg N, DO   5,000 Units at 12/11/21 1420   hydrALAZINE (APRESOLINE) tablet 100 mg  100 mg Oral TID Virl Axe, MD   100 mg at 12/12/21 9604   HYDROmorphone (DILAUDID) injection 0.5 mg  0.5 mg Intravenous Q2H PRN Lin Landsman, NP       HYDROmorphone (DILAUDID) injection 1 mg  1 mg Intravenous Q2H PRN Lin Landsman, NP   1 mg at 12/12/21 1204   isosorbide mononitrate (IMDUR) 24 hr tablet 60 mg  60 mg Oral Daily Virl Axe, MD   60 mg at 12/12/21 5409   MEDLINE mouth rinse  15 mL Mouth Rinse BID Joni Reining C, DO   15 mL at 12/12/21 0925   ondansetron (ZOFRAN) injection 4 mg  4 mg Intravenous Q8H PRN Wayland Denis, MD   4 mg at 12/11/21 1053   pantoprazole (PROTONIX) EC tablet 40 mg  40 mg Oral Daily Virl Axe, MD   40 mg at 12/12/21 0925   piperacillin-tazobactam (ZOSYN) IVPB 3.375 g  3.375 g Intravenous Q8H Narendra, Nischal, MD 12.5 mL/hr at 12/12/21 1339 3.375 g at 12/12/21 1339   senna-docusate (Senokot-S) tablet 2 tablet  2 tablet Oral BID Virl Axe, MD   2 tablet at 12/12/21 8119     Discharge Medications: Please see discharge summary for a list of discharge medications.  Relevant Imaging Results:  Relevant Lab Results:   Additional Information SSN: [id number]. Hospice to follow at [facility], LCSW

## 2021-12-12 NOTE — Progress Notes (Addendum)
Daily Progress Note   [name]       Date: 12/12/2021 DOB: April 19, 1976  Age: 45 y.o. MRN#: [id number] Attending Physician: Aldine Contes, MD Primary Care Physician: Mitzi Hansen, MD Admit Date: 12/03/2021  Reason for Consultation/Follow-up: Establishing goals of care, left hemiplegia and dysarthria from prior stroke  Subjective: Received notification that brother/[name] called PMT with questions.   Chart review performed. Received report from primary RN - no acute concerns. RN states patient's PO intake remains poor and she has been sleeping most of the day.  Went to visit patient at bedside - no family/visitors present. Patient was lying in bed awake, alert, oriented, and able to participate in conversation. Allowed time for patient to respond to questions appropriately as her responses were delayed. No signs or non-verbal gestures of pain or discomfort noted. No respiratory distress, increased work of breathing, or secretions noted. She reports her pain is better today compared to yesterday. She has intermittent pain on her left side that is managed with PRN dilaudid. Her primary concern is her pain when staff have to roll/move her. She states this is a 7/10 pain. Reviewed that we can premedicate her prior to moving - she is agreeable.  Emotional support provided as she expressed feeling "sad" today - she has been speaking with her brother and states she misses her family. She ready to be discharged from the hospital. She states, "I am looking forward to being on hospice for comfort care." She also "feels ready" and at peace for her future on hospice. Patient reiterates today she does not feel she has a good quality of life.   We reviewed medical boundaries - she is ok  with continuing antibiotics but does not want any more blood transfusions or procedures.   Chaplain services offered - she accepted.  All questions and concerns addressed. Encouraged to call with questions and/or concerns. PMT card previously provided.  Discussed case with Lenna Sciara, RN from [employer].   Returned [name]'s phone call - emotional support and therapeutic listening provided as he's having a hard time accepting patient's decision for hospice. Prognostication was reviewed per his request. Reviewed indicators that make patient hospice appropriate and anticipated complications if patient discharges without hospice care. All questions answered.   Length of Stay: 8  Current Medications: Scheduled Meds:  acetaminophen  1,000 mg Oral Q6H   aspirin  81 mg Oral Daily   atorvastatin  40 mg Oral Daily   carvedilol  25 mg Oral BID WC   cloNIDine  0.1 mg Oral TID   heparin  5,000 Units Subcutaneous Q8H   hydrALAZINE  100 mg Oral TID   isosorbide mononitrate  60 mg Oral Daily   mouth rinse  15 mL Mouth Rinse BID   pantoprazole  40 mg Oral Daily   senna-docusate  2 tablet Oral BID    Continuous Infusions:  piperacillin-tazobactam (ZOSYN)  IV 3.375 g (12/12/21 0535)    PRN Meds: HYDROmorphone (DILAUDID) injection, ondansetron (ZOFRAN) IV  Physical Exam Vitals and nursing note reviewed.  Constitutional:      General: She is not in acute distress.    Appearance: She is ill-appearing.  Pulmonary:     Effort: No respiratory distress.  Skin:    General: Skin is warm and dry.  Neurological:     Mental Status: She is alert and oriented to person, place, and time.     Motor: Weakness present.  Psychiatric:        Attention and Perception: Attention normal.        Speech: Speech is delayed.        Behavior: Behavior is slowed. Behavior is cooperative.        Cognition and Memory: Cognition and memory normal.            Vital Signs: BP (!) 156/78 (BP Location: Right  Arm)    Pulse 76    Temp 99.1 F (37.3 C) (Oral)    Resp 14    Ht 5\' 5"  (1.651 m)    Wt 63.9 kg    SpO2 97%    BMI 23.44 kg/m  SpO2: SpO2: 97 % O2 Device: O2 Device: Room Air O2 Flow Rate:    Intake/output summary:  Intake/Output Summary (Last 24 hours) at 12/12/2021 1117 Last data filed at 12/12/2021 0600 Gross per 24 hour  Intake --  Output 100 ml  Net -100 ml   LBM: Last BM Date: 12/11/21 Baseline Weight: Weight: 63.9 kg Most recent weight: Weight: 63.9 kg       Palliative Assessment/Data: PPS 20%      Patient Active Problem List   Diagnosis Date Noted   Hypercalcemia 12/04/2021   Acute encephalopathy 12/03/2021   Weakness 11/22/2021   Pressure injury of skin 10/13/2021   Lupus anticoagulant positive    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)    Acute blood loss anemia 10/10/2021   Hypertension    Non-ischemic cardiomyopathy (HCC)    Swelling of left hand    Proctocolitis    Constipation    Uterine fibroid    Left hemiparesis (HCC)    Hemorrhagic stroke (HCC)    Abnormal uterine bleeding (AUB)    Staphylococcus epidermidis bacteremia    Iron deficiency anemia due to chronic blood loss 05/25/2021   Cerebral edema (HCC) 05/25/2021   Chronic HFrEF (heart failure with reduced ejection fraction) ([place]) 05/25/2021   Pneumonia due to methicillin susceptible Staphylococcus aureus ([place]) 05/25/2021   Acute respiratory failure with hypoxia (HCC)    Acute right MCA stroke ([place]) 05/15/2021   Uncontrolled hypertension 05/15/2021   Cerebrovascular accident (CVA) ([place]) 05/15/2021   IDA (iron deficiency anemia) 03/01/2021   Healthcare maintenance 03/01/2021   Onychomycosis 03/01/2021   Food insecurity 02/02/2021   Psychosis ([place])    Fibroid  01/11/2021   Menorrhagia 01/11/2021   Acute exacerbation of CHF (congestive heart failure) ([place]) 01/10/2021   AKI (acute kidney injury) ([place])    Symptomatic anemia     SVT (supraventricular tachycardia) ([place]) 05/01/2020   CHF (congestive heart failure) ([place]) 05/01/2020    Palliative Care Assessment & Plan   Patient Profile: 45 y.o. female  with history of right MCA 2022 04/2021 with thrombectomy, intubation, trach and decannulation, and discharge to [facility] 06/2021.  Additional history includes of DVT, chronic heart failure with recovered EF, paroxysmal SVT, hypertension, and uterine fibroids s/p embolization. She presented to the emergency department on 12/03/2021 with altered mental status. Patient resides at [facility] and facility staff noted change in mental status from baseline. In the ED, she was found to be febrile at 100.4 with labs significant for leukocytosis of 17.6 and elevated creatinine of 2.5. Chest x-ray was unremarkable. CT head shows no evidence of acute intracranial abnormalities. Admitted to IMTS for further evaluation of acute encephalopathy.  12/13 - EEG negative for seizures and is suggestive of moderate diffuse encephalopathy.  Assessment: Acute encephalopathy likely secondary to hypercalcemia vs infection Normocytic blood loss anemia AKI Dehydration Aphasia Left sided weakness Concern for endometriosis  Recommendations/Plan: Continue current gentle medical interventions including antibiotics - no escalation of care, no further blood transfusions or invasive procedures Continue DNR/DNI as previously documented - durable DNR form completed and placed in shadow chart. Copy was made and will be scanned into Vynca/ACP tab Patient's goal is for discharge back to [facility] with hospice as soon as possible - she does not want to be rehospitalized after discharge Chaplain consulted for: patient's request for emotional support Dilaudid 0.5mg  IV q2h PRN for premedicating prior to turning/moving only Continue scheduled acetaminophen and PRN dilaudid 1mg  for pain management PMT will continue to follow peripherally. If there are any  imminent needs please call the service directly  **ADDENDUM** Notified that patient was approved for hospice services and will discharge back to LTC today. Discharge comfort orders sent - patient has a hard time swallowing so liquid dilaudid sent for 1mg  PO q2h PRN pain, dyspnea, increased work of breathing, distress, RR >25, premedicating prior to turning/moving. Also recommend continuing scheduled tylenol - can adjust to 1000mg  TID.   Goals of Care and Additional Recommendations: Limitations on Scope of Treatment: Avoid Hospitalization, Minimize Medications, No Artificial Feeding, No Blood Transfusions, No Diagnostics, No Surgical Procedures, and No Tracheostomy  Code Status:    Code Status Orders  (From admission, onward)           Start     Ordered   12/06/21 1623  Do not attempt resuscitation (DNR)  Continuous       Question Answer Comment  In the event of cardiac or respiratory ARREST Do not call a code blue   In the event of cardiac or respiratory ARREST Do not perform Intubation, CPR, defibrillation or ACLS   In the event of cardiac or respiratory ARREST Use medication by any route, position, wound care, and other measures to relive pain and suffering. May use oxygen, suction and manual treatment of airway obstruction as needed for comfort.      12/06/21 1622           Code Status History     Date Active Date Inactive Code Status Order ID Comments User Context   12/03/2021 2016 12/06/2021 1622 Full Code [id number]  Mike Craze, DO ED   11/22/2021 0435 11/25/2021 0029 Full Code  [id number]  Rick Duff, MD ED   10/10/2021 0313 10/27/2021 1655 Partial Code [id number]  Rise Patience, MD Inpatient   05/30/2021 1101 07/04/2021 0522 Partial Code [id number]  Philis Pique, NP Inpatient   05/15/2021 0754 05/30/2021 1101 Full Code [id number]  Greta Doom, MD ED   02/01/2021 1644 02/02/2021 2005 Full Code [id number]  Ledora Bottcher, [place] ED   01/10/2021 1455  01/27/2021 2035 Full Code [id number]  Marty Heck, DO ED   05/01/2020 1644 05/03/2020 1813 Full Code [id number]  Lequita Halt, MD ED       Prognosis:  < 6 months - Poor in the setting of recurrent hospitalizations, poor functional status, dysphasia with aspiration, albumin 1.8, risk for continued acute blood loss anemia (does not want surgery to remove fibroid or further blood transfusions)  Discharge Planning: [place] with [place] was discussed with primary RN, attending team, [facility], [place] liaison, patient, patient's brother  Thank you for allowing the Palliative Medicine Team to assist in the care of this patient.   Total Time 90 minutes Prolonged Time Billed  yes       Greater than 50%  of this time was spent counseling and coordinating care related to the above assessment and plan.  Lin Landsman, NP  Please contact Palliative Medicine Team phone at [phone] for questions and concerns.

## 2021-12-12 NOTE — TOC Initial Note (Addendum)
Transition of Care [facility]) - Initial/Assessment Note    Patient Details  Name: Holly Bradley MRN: [id number] Date of Birth: 1976-08-25  Transition of Care [facility]) CM/SW Contact:    Benard Halsted, LCSW Phone Number: 12/12/2021, 8:35 AM  Clinical Narrative:                 CSW received referral to add hospice services for patient at [facility]. CSW contacted [employer]; awaiting response on which agency they contract with.   [place] uses [employer]. CSW sent referral to [facility].     Barriers to Discharge: Continued Medical Work up   Patient Goals and CMS Choice Patient states their goals for this hospitalization and ongoing recovery are:: Return to snf w/hospice CMS Medicare.gov Compare Post Acute Care list provided to:: Patient Choice offered to / list presented to : Patient  Expected Discharge Plan and Services   In-house Referral: Clinical Social Work   Post Acute Care Choice: [place] arrangements for the past 2 months: [place]                                      Prior Living Arrangements/Services Living arrangements for the past 2 months: [place] Lives with:: Facility Resident Patient language and need for interpreter reviewed:: Yes Do you feel safe going back to the place where you live?: Yes      Need for Family Participation in Patient Care: Yes (Comment) Care giver support system in place?: Yes (comment)   Criminal Activity/Legal Involvement Pertinent to Current Situation/Hospitalization: No - Comment as needed  Activities of Daily Living Home Assistive Devices/Equipment: None ADL Screening (condition at time of admission) Patient's cognitive ability adequate to safely complete daily activities?: No Is the patient deaf or have difficulty hearing?: No Does the patient have difficulty seeing, even when wearing glasses/contacts?: No Does the patient have difficulty  concentrating, remembering, or making decisions?: Yes Patient able to express need for assistance with ADLs?: Yes Does the patient have difficulty dressing or bathing?: Yes Independently performs ADLs?: No Communication: Needs assistance Is this a change from baseline?: Pre-admission baseline Dressing (OT): Dependent Is this a change from baseline?: Pre-admission baseline Grooming: Dependent Is this a change from baseline?: Pre-admission baseline Feeding: Dependent Is this a change from baseline?: Pre-admission baseline Bathing: Dependent Is this a change from baseline?: Pre-admission baseline Toileting: Dependent Is this a change from baseline?: Pre-admission baseline In/Out Bed: Independent Walks in Home: Dependent Is this a change from baseline?: Pre-admission baseline Does the patient have difficulty walking or climbing stairs?: No Weakness of Legs: None Weakness of Arms/Hands: None  Permission Sought/Granted Permission sought to share information with : Facility Sport and exercise psychologist, Family Supports Permission granted to share information with : Yes, Verbal Permission Granted  Share Information with NAME: [name]  Permission granted to share info w AGENCY: [place] granted to share info w Relationship: Brother  Permission granted to share info w Contact Information: [phone]  Emotional Assessment Appearance:: Appears stated age Attitude/Demeanor/Rapport: Engaged Affect (typically observed): Accepting, Appropriate Orientation: : Oriented to Self, Oriented to Place, Oriented to  Time, Oriented to Situation Alcohol / Substance Use: Not Applicable Psych Involvement: No (comment)  Admission diagnosis:  Acute encephalopathy [G93.40] AKI (acute kidney injury) ([place]) [N17.9] Febrile illness [R50.9] Patient Active Problem List   Diagnosis Date Noted   Hypercalcemia 12/04/2021  Acute encephalopathy 12/03/2021   Weakness 11/22/2021   Pressure injury of skin  10/13/2021   Lupus anticoagulant positive    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)    Acute blood loss anemia 10/10/2021   Hypertension    Non-ischemic cardiomyopathy (HCC)    Swelling of left hand    Proctocolitis    Constipation    Uterine fibroid    Left hemiparesis (HCC)    Hemorrhagic stroke (HCC)    Abnormal uterine bleeding (AUB)    Staphylococcus epidermidis bacteremia    Iron deficiency anemia due to chronic blood loss 05/25/2021   Cerebral edema (HCC) 05/25/2021   Chronic HFrEF (heart failure with reduced ejection fraction) ([place]) 05/25/2021   Pneumonia due to methicillin susceptible Staphylococcus aureus ([place]) 05/25/2021   Acute respiratory failure with hypoxia ([place])    Acute right MCA stroke ([place]) 05/15/2021   Uncontrolled hypertension 05/15/2021   Cerebrovascular accident (CVA) ([place]) 05/15/2021   IDA (iron deficiency anemia) 03/01/2021   Healthcare maintenance 03/01/2021   Onychomycosis 03/01/2021   Food insecurity 02/02/2021   Psychosis ([place])    Fibroid 01/11/2021   Menorrhagia 01/11/2021   Acute exacerbation of CHF (congestive heart failure) ([place]) 01/10/2021   AKI (acute kidney injury) ([place])    Symptomatic anemia    SVT (supraventricular tachycardia) ([place]) 05/01/2020   CHF (congestive heart failure) ([place]) 05/01/2020   PCP:  Mitzi Hansen, MD Pharmacy:   [facility] [place] [address] Phone: [phone] Fax: [phone]  [id number] - [place], [place] [address] AT [address] [place] [place] [address] Phone: [phone] Fax: [place] [address] [place] [address] Phone: [phone] Fax: [phone]     Social Determinants of Health (SDOH) Interventions    Readmission Risk Interventions No flowsheet data found.

## 2021-12-12 NOTE — Discharge Summary (Addendum)
Name: Holly Bradley MRN: [id number] DOB: Nov 10, 1976 45 y.o. PCP: Mitzi Hansen, MD  Date of Admission: 12/03/2021  3:44 PM Date of Discharge: 12/12/2021 Attending Physician: Aldine Contes, MD  Discharge Diagnosis: Degenerating uterine fibroids Late onset endometritis 2/2 [place] Acute blood loss anemia on chronic anemia Moderate hypercalcemia Acute metabolic encephalopathy Mild hypokalemia Acute kidney injury, prerenal  Discharge Medications: Allergies as of 12/12/2021   No Known Allergies      Medication List     TAKE these medications    acetaminophen 500 MG tablet Commonly known as: TYLENOL Take 2 tablets (1,000 mg total) by mouth in the morning, at noon, and at bedtime. What changed: when to take this   amiodarone 200 MG tablet Commonly known as: PACERONE Take 200 mg by mouth daily.   aspirin 81 MG chewable tablet Chew 1 tablet (81 mg total) by mouth daily.   atorvastatin 40 MG tablet Commonly known as: LIPITOR Take 1 tablet (40 mg total) by mouth daily. What changed: when to take this   baclofen 20 MG tablet Commonly known as: LIORESAL Take 1 tablet (20 mg total) by mouth 3 (three) times daily.   bisacodyl 10 MG suppository Commonly known as: DULCOLAX Place 10 mg rectally See admin instructions. As needed for constipation x 1 dose if no relief from milk of mag   carvedilol 25 MG tablet Commonly known as: COREG Take 1 tablet (25 mg total) by mouth 2 (two) times daily with a meal.   cloNIDine 0.1 MG tablet Commonly known as: CATAPRES Take 1 tablet (0.1 mg total) by mouth 3 (three) times daily.   Entresto 97-103 MG Generic drug: sacubitril-valsartan Take 1 tablet by mouth 2 (two) times daily.   escitalopram 20 MG tablet Commonly known as: LEXAPRO Take 20 mg by mouth daily.   feeding supplement (PRO-STAT SUGAR FREE 64) Liqd Take 30 mLs by mouth in the morning and at bedtime.   feeding supplement Liqd Take 237 mLs by mouth 3 (three)  times daily between meals.   ferrous sulfate 325 (65 FE) MG tablet Take 1 tablet (325 mg total) by mouth 2 (two) times daily with a meal.   hydrALAZINE 100 MG tablet Commonly known as: APRESOLINE Take 1 tablet (100 mg total) by mouth 3 (three) times daily.   HYDROmorphone HCl 1 MG/ML Liqd Commonly known as: DILAUDID Take 1 mL (1 mg total) by mouth every 2 (two) hours as needed for severe pain or moderate pain (distress, shortness of breath, RR >25, dyspnea, premedicating prior to turning/moving **Comfort care**).   isosorbide mononitrate 60 MG 24 hr tablet Commonly known as: IMDUR Take 60 mg by mouth daily.   lactulose 10 GM/15ML solution Commonly known as: CHRONULAC Take 15 mLs (10 g total) by mouth 2 (two) times daily.   linaclotide 145 MCG Caps capsule Commonly known as: LINZESS Take 145 mcg by mouth daily before breakfast.   mirtazapine 7.5 MG tablet Commonly known as: REMERON Take 7.5 mg by mouth at bedtime.   multivitamin with minerals Tabs tablet Take 1 tablet by mouth daily.   pantoprazole 40 MG tablet Commonly known as: PROTONIX Take 40 mg by mouth daily.   polyethylene glycol 17 g packet Commonly known as: MIRALAX / GLYCOLAX Take 17 g by mouth daily.   RA SALINE ENEMA RE Place 1 enema rectally See admin instructions. As needed for constipation if no relief from bisacodyl suppository   senna-docusate 8.6-50 MG tablet Commonly known as: Senokot-S Take 2 tablets by mouth 2 (  two) times daily.   simethicone 40 MG/0.6ML drops Commonly known as: MYLICON Take 0.6 mLs (40 mg total) by mouth every 6 (six) hours as needed for flatulence.   torsemide 20 MG tablet Commonly known as: DEMADEX TAKE 2 TABLETS (40 MG TOTAL) BY MOUTH 2 (TWO) TIMES DAILY. What changed:  how much to take how to take this when to take this        Disposition and follow-up:   Holly Bradley was discharged from [facility] in Stable condition.  At the hospital  follow up visit please address:  Degenerating uterine fibroids Late onset endometritis 2/2 [place] Decision to pursue comfort care measures: Patient will be discharged on home medications and pain mediations per palliative medicine.  Patient will discharge to SNF.  Hospice was approved.  Patient will need follow-up with hospice at SNF.  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   #Acute infectious process, possible late onset endometritis 2/2 [place] #Degenerating uterine fibroids #Acute blood loss anemia, likely 2/2 vaginal bleeding Patient was admitted for acute encephalopathy likely secondary to metabolic encephalopathy related to dehydration, possible infection.  White count downtrending from 26, febrile this admission, also endorsing lower abdominal/pelvic pain with report of foul-smelling dark vaginal discharge.  Acute blood loss anemia requiring 2 unit RBCs this admission so far.  Given bleeding and lower abdominal pain, CT abdomen pelvis was obtained which showed multiple intramural fibroids, multiple masslike soft tissue structures with evidence of soft tissue gas new from prior.  Radiology differential included endometrial carcinoma with necrosis versus infected incarcerated fibroid.  Patient was started on broad abx for acute intra-abdominopelvic infection. Gynecology consulted, and they suspect possibly late onset endometritis following [place] . Pain discharge, low grade fever, leukocytosis likely related to  degenerating fibroid. Should expect abdominal pain, discharge to continue as fibroids degenerate, usually pain managed with NSAIDs per GYN. Hemoglobin stabilized without further signs of vaginal bleeding.  Clinical status improving on antibiotics with decreasing leukocytosis. Given ongoing difficulties with uterine fibroids and patient wanting to avoid hysterectomy and further surgical procedures, palliative medicine was consulted to discuss hospice care at patient's request.  After discussion with palliative, patient electing to pursue hospice care.  Plan for patient to discharge to SNF on comfort care measures with hospice care at SNF. Will need hospice follow-up at SNF.  #Moderate hypercalcemia, improving Patient noted to be hypercalcemic this admission as well as last admission 3 to 4 weeks ago.  Is having encephalopathy and abdominal pain which may be worsened by hypercalcemia.  S/p half liter LR bolus with maintenance fluids.  Patient does have history of HFpEF, will need to be mindful with fluids.  Corrected calcium today 12.1, improving.  PTH low normal, suggesting non-PTH mediated hypercalcemia, which can occur secondary to malignancy, vitamin D intoxication, granulomatous inflammatory process, bone resorption seen in immobilization. Vit D normal.  It is possible PTH is inappropriately low normal and should be even lower given hypercalcemia, in this case concern for primary hyperparathyroidism.  Patient's calcium improving.  Patient switched to comfort care.  #Acute  encephalopathy, resolved Intracranial imaging negative for acute process.  Suspect due to dehydration, acute infectious or inflammatory process see above.  Patient also has hypercalcemia ongoing since previous admission, which may have contributed to encephalopathy.  At neurologic baseline.  #Mild Hypokalemia, resolved Potassium low this admission, electrolytes were repleted as needed.  #Prolonged QT, resolved #Paroxysmal SVT On  12/14, pt had prolonged QT of 550 to 600. An EKG showed NSR with corrected QT of 503.  Patient has history of paroxysmal SVT for which she takes Amiodarone 200 mg daily.  Also on Lexapro 20 mg daily. Both are QT prolonging agents.  Amiodarone and Lexapro were held this admission in the setting of QT prolongation.  #Acute kidney injury likely 2/2 dehydration, improving Serum creatinine 2.04 on admission (baseline of ~0.7).  AKI likely secondary to dehydration. Kidney function has returned to baseline.  #Hx LLE DVT Patient previously on Eliquis which was discontinued in the setting of recent uterine bleed (October 2022).  An IVC filter was placed 10/2021 to prevent PE since patient unable to take anticoagulant. Patient is status post uterine artery embolization.  Needs follow-up for in Feb-March with vascular surgery to discuss resuming anticoagulation versus removing IVC filter, and reassess status of DVT in left leg.  Patient has chosen to go forward with hospice care.  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  Appears to be at neurologic baseline given recent improvement in communication and command following.  Follows with Dr. Leonie Man in stroke clinic.  Was continued on aspirin and atorvastatin.  #Hypertension Home medications: Carvedilol 25 mg twice daily, clonidine 0.1 mg 3 times daily, hydralazine 100 mg 3 times daily, Imdur 60 mg daily. Patient was restarted on antihypertensives for elevated blood pressures.  BPs appropriate.  Held [place] in the setting of AKI.  #HF with recovered EF Recent echo in May 2022 revealed EF of 55-60% with grade II diastolic dysfunction and severe left atrial enlargement.  Euvolemic to dry on exam.  Hold torsemide in the setting of dehydration AKI, restarted on beta-blocker, Imdur, hydralazine, clonidine.  Held [place] in the setting of  AKI.  Other home medications continued this admission: Protonix 40 mg QD, senokot s  Subjective on day of discharge: No acute events overnight.  Patient reports improvement in abdominal pain this morning.  Feels more comfortable today.  Discussed her decision to switch to hospice care.  She wonders out loud if she has made the right decision.  States her brother was not happy with her choice.  Discussed importance of choosing what is right for her.  Let patient know that palliative care will be by again to confirm her decision.  Discharge Exam:   BP 117/71 (BP Location: Right Arm)    Pulse 68    Temp 97.7 F (36.5 C) (Oral)    Resp 10    Ht 5\' 5"  (1.651 m)    Wt 63.9 kg    SpO2 97%    BMI 23.44 kg/m  Discharge exam: Constitutional: chronically ill-appearing, in no acute distress.   HENT: normocephalic, atraumatic Eyes: conjunctiva non-erythematous, without scleral icterus Cardiovascular: regular rate and rhythm, no m/r/g Pulmonary/Chest: normal work of breathing on room air, Lungs CTAB  Abdominal: mild distention, normal bowel sounds Neurological: Awake, alert, consistently answers questions with short answers/ asks questions, shakes head yes no Skin: warm and dry, no rashes or lesions on exposed surfaces Psych: normal behavior,  normal affect   Pertinent Labs, Studies, and Procedures:  CBC Latest Ref Rng & Units 12/11/2021 12/10/2021 12/09/2021  WBC 4.0 - 10.5 K/uL 11.9(H) 18.1(H) 20.6(H)  Hemoglobin 12.0 - 15.0 g/dL 7.9(L) 8.2(L) 7.4(L)  Hematocrit 36.0 - 46.0 % 24.6(L) 24.9(L) 22.5(L)  Platelets 150 - 400 K/uL 404(H) 347 360   CMP Latest Ref Rng & Units 12/11/2021 12/10/2021 12/09/2021  Glucose 70 - 99 mg/dL 82 96 90  BUN 6 - 20 mg/dL 36(H) 44(H) 52(H)  Creatinine 0.44 - 1.00 mg/dL 0.92 0.98 1.18(H)  Sodium 135 - 145 mmol/L 143 144 144  Potassium 3.5 - 5.1 mmol/L 4.1 4.6 3.4(L)  Chloride 98 - 111 mmol/L 113(H) 114(H) 112(H)  CO2 22 - 32 mmol/L 22 23 23   Calcium 8.9 - 10.3  mg/dL 10.3 10.6(H) 10.8(H)  Total Protein 6.5 - 8.1 g/dL 6.4(L) 6.9 6.8  Total Bilirubin 0.3 - 1.2 mg/dL 1.0 1.2 1.1  Alkaline Phos 38 - 126 U/L 133(H) 149(H) 151(H)  AST 15 - 41 U/L 15 14(L) 13(L)  ALT 0 - 44 U/L 8 9 9     CT Head Wo Contrast  Result Date: 12/03/2021 CLINICAL DATA:  Mental status change, unknown cause EXAM: CT HEAD WITHOUT CONTRAST TECHNIQUE: Contiguous axial images were obtained from the base of the skull through the vertex without intravenous contrast. COMPARISON:  November 21, 2021. FINDINGS: Brain: Encephalomalacia in the right MCA territory, similar. Right brainstem Wallerian degeneration. Embolization coils are again noted in the posterosuperior right temporal lobe. Additional patchy white matter hypoattenuation, nonspecific but compatible with chronic microvascular ischemic disease. Remote infarcts the left basal ganglia and left cerebellum. No evidence of acute hemorrhage, mass lesion, new abnormal mass effect, or extra-axial fluid collection. Ex vacuo ventricular dilation of the right lateral ventricle, similar. No hydrocephalus. Vascular: No hyperdense vessel identified. Skull: No acute fracture. Sinuses/Orbits: Moderate paranasal sinus mucosal thickening. Unremarkable orbits. Other: No mastoid effusions. IMPRESSION: 1. No evidence of acute intracranial abnormality. MRI could provide more sensitive evaluation for acute infarct if clinically indicated. 2. Remote infarcts and extensive chronic microvascular ischemic disease, as detailed above. Electronically Signed   By: Margaretha Sheffield M.D.   On: 12/03/2021 17:26   MR BRAIN WO CONTRAST  Result Date: 12/03/2021 CLINICAL DATA:  Mental status change EXAM: MRI HEAD WITHOUT CONTRAST TECHNIQUE: Multiplanar, multiecho pulse sequences of the brain and surrounding structures were obtained without intravenous contrast. COMPARISON:  11/22/2021 MRI, correlation is also made with 12/03/2021 CT head FINDINGS: Evaluation is limited by  motion artifact. Brain: No restricted diffusion to suggest acute infarct. Redemonstrated extensive encephalomalacia in the right frontal, temporal, and parietal lobe, with additional lacunar infarcts in the cerebellum, pons, and basal ganglia. Confluent T2 hyperintense signal in the periventricular white matter, likely the sequela of severe chronic small vessel ischemic disease. Mild ex vacuo dilatation of the right lateral ventricle. No hydrocephalus or extra-axial collection. No acute hemorrhage, mass, mass effect, or midline shift. Findings consistent with wallerian degeneration in the right corticospinal tracts. Vascular: Normal flow voids. Skull and upper cervical spine: Normal marrow signal. Sinuses/Orbits: Mucosal thickening throughout the paranasal sinuses, most prominent in the right maxillary sinus. The orbits are unremarkable. Other: The mastoids are well aerated. IMPRESSION: No acute intracranial process. Electronically Signed   By: Merilyn Baba M.D.   On: 12/03/2021 22:50   DG Chest Port 1 View  Result Date: 12/03/2021 CLINICAL DATA:  Febrile, sepsis EXAM: PORTABLE CHEST 1 VIEW COMPARISON:  10/19/2021 FINDINGS: Single frontal view of the chest demonstrates  an unremarkable cardiac silhouette. No airspace disease, effusion, or pneumothorax. No acute bony abnormality. IMPRESSION: 1. No acute intrathoracic process. Electronically Signed   By: Randa Ngo M.D.   On: 12/03/2021 17:17   DG Abd Portable 1 View  Result Date: 12/03/2021 CLINICAL DATA:  Febrile, sepsis, abdominal swelling EXAM: PORTABLE ABDOMEN - 1 VIEW COMPARISON:  11/22/2021 FINDINGS: Supine frontal view of the abdomen and pelvis was performed. Stable IVC filter at the L3 level. No bowel obstruction or ileus. No masses or abnormal calcifications. No acute bony abnormalities. IMPRESSION: 1. Unremarkable bowel gas pattern. Electronically Signed   By: Randa Ngo M.D.   On: 12/03/2021 17:18   EEG adult  Result Date:  12/04/2021 Lora Havens, MD     12/04/2021  5:06 PM [name] MRN: [id number] Epilepsy Attending: Lora Havens Referring Physician/Provider: Virl Axe, Date: 12/04/2021 Duration: 25.06 mins Patient history: 45 yo female with PMH including CVA w/ residual left side weakness presented for acute encephalopathy. EEG to evaluate for seizure. Level of alertness: Awake AEDs during EEG study: None Technical aspects: This EEG study was done with scalp electrodes positioned according to the 10-20 International system of electrode placement. Electrical activity was acquired at a sampling rate of 500Hz  and reviewed with a high frequency filter of 70Hz  and a low frequency filter of 1Hz . EEG data were recorded continuously and digitally stored. Description: No posterior dominant rhythm was seen. EEG showed continuous generalized 3 to 6 Hz theta-delta slowing. Generalized periodic discharges with triphasic morphology were noted at 1.5-2Hz  at times. Hyperventilation and photic stimulation were not performed.   ABNORMALITY - Periodic discharges with triphasic morphology, generalized ( GPDs) - Continuous slow, generalized IMPRESSION: This study showed generalized periodic discharges with triphasic morphology which can be on the ictal-interictal continuum. The morphology, frequency and reactivity are more likely suggestive of toxic-metabolic causes like hyperammonia. Additionally, study is suggestive of moderate diffuse encephalopathy, nonspecific etiology. No seizures or epileptiform discharges were seen throughout the recording. Lora Havens    Discharge Instructions: Discharge Instructions     No wound care   Complete by: As directed        Portions of this report may have been transcribed using voice recognition software. Every effort was made to ensure accuracy; however, inadvertent computerized transcription errors may be present.   Wayland Denis, MD 12/12/21,  3:01 PM Pager:  [phone] Internal Medicine Resident, PGY-1 [facility]

## 2021-12-12 NOTE — H&P (Addendum)
Hydrologist [facility]) Hospital Liaison: RN note    Notified by Transition of Care Manger of patient/family request for [facility] services at home after discharge. Chart and patient information under review by [facility] physician. Hospice eligibility pending currently.    Met with patient at bedside  to initiate education related to hospice philosophy, services and team approach to care. Ms. Boyde verbalized understanding of information given. Per PMT patients brother was updated by phone.   Per discussion, plan is for discharge back to [facility] by [facility] once medically stable.     Please send signed and completed DNR form home with patient/family. Patient will need prescriptions for discharge comfort medications.     [facility] aware of the above. Please notify [facility] when patient is ready to leave the unit at discharge. (Call [phone] or [phone] after 5pm.)   Please call with any hospice related questions.     Thank you for this referral.    Clementeen Hoof, BSN, [facility] (listed on [facility] under Hospice and [place] of [place] [phone]  [phone]

## 2021-12-13 MED ORDER — HYDROMORPHONE HCL 2 MG PO TABS: 1.0000 mg | ORAL_TABLET | Freq: Once | ORAL | Status: DC

## 2021-12-13 MED ORDER — HYDROMORPHONE HCL 1 MG/ML PO LIQD
1.0000 mg | Freq: Once | ORAL | Status: AC
  Administered 2021-12-13: 08:00:00 1 mg via ORAL
  Filled 2021-12-13: qty 1

## 2021-12-13 NOTE — Progress Notes (Signed)
Patient requests to only take pain medicine and no longer wants to take home medicines. Will update MD.

## 2021-12-13 NOTE — Progress Notes (Signed)
[facility] made aware patient is en route

## 2021-12-13 NOTE — Progress Notes (Signed)
CSW notified [facility] that [facility] arrived at 5am to pick patient up and patient refused transport at that time. [facility] to pick patient up this morning.   Gilmore Laroche, MSW, [facility]

## 2021-12-14 ENCOUNTER — Emergency Department (HOSPITAL_COMMUNITY): Payer: Medicaid Other

## 2021-12-14 ENCOUNTER — Other Ambulatory Visit: Payer: Self-pay

## 2021-12-14 ENCOUNTER — Encounter (HOSPITAL_COMMUNITY): Payer: Self-pay | Admitting: Emergency Medicine

## 2021-12-14 ENCOUNTER — Emergency Department (HOSPITAL_COMMUNITY)
Admission: EM | Admit: 2021-12-14 | Discharge: 2021-12-15 | Disposition: A | Discharge status: Home / Self Care | Payer: Medicaid Other | Attending: Emergency Medicine | Admitting: Emergency Medicine

## 2021-12-14 DIAGNOSIS — Z79899 Other long term (current) drug therapy: Secondary | ICD-10-CM | POA: Diagnosis not present

## 2021-12-14 DIAGNOSIS — I110 Hypertensive heart disease with heart failure: Secondary | ICD-10-CM | POA: Insufficient documentation

## 2021-12-14 DIAGNOSIS — R4182 Altered mental status, unspecified: Secondary | ICD-10-CM

## 2021-12-14 DIAGNOSIS — R1084 Generalized abdominal pain: Secondary | ICD-10-CM | POA: Insufficient documentation

## 2021-12-14 DIAGNOSIS — R112 Nausea with vomiting, unspecified: Secondary | ICD-10-CM | POA: Diagnosis not present

## 2021-12-14 DIAGNOSIS — Z66 Do not resuscitate: Secondary | ICD-10-CM

## 2021-12-14 DIAGNOSIS — I509 Heart failure, unspecified: Secondary | ICD-10-CM | POA: Diagnosis not present

## 2021-12-14 DIAGNOSIS — R109 Unspecified abdominal pain: Secondary | ICD-10-CM

## 2021-12-14 DIAGNOSIS — F17200 Nicotine dependence, unspecified, uncomplicated: Secondary | ICD-10-CM | POA: Insufficient documentation

## 2021-12-14 DIAGNOSIS — Z7982 Long term (current) use of aspirin: Secondary | ICD-10-CM | POA: Diagnosis not present

## 2021-12-14 LAB — CBC WITH DIFFERENTIAL/PLATELET
Abs Immature Granulocytes: 0.1 10*3/uL — ABNORMAL HIGH (ref 0.00–0.07)
Basophils Absolute: 0 10*3/uL (ref 0.0–0.1)
Basophils Relative: 0 %
Eosinophils Absolute: 0 10*3/uL (ref 0.0–0.5)
Eosinophils Relative: 0 %
HCT: 30.1 % — ABNORMAL LOW (ref 36.0–46.0)
Hemoglobin: 9.5 g/dL — ABNORMAL LOW (ref 12.0–15.0)
Immature Granulocytes: 1 %
Lymphocytes Relative: 6 %
Lymphs Abs: 0.6 10*3/uL — ABNORMAL LOW (ref 0.7–4.0)
MCH: 26.8 pg (ref 26.0–34.0)
MCHC: 31.6 g/dL (ref 30.0–36.0)
MCV: 84.8 fL (ref 80.0–100.0)
Monocytes Absolute: 0.5 10*3/uL (ref 0.1–1.0)
Monocytes Relative: 5 %
Neutro Abs: 8.3 10*3/uL — ABNORMAL HIGH (ref 1.7–7.7)
Neutrophils Relative %: 88 %
Platelets: 600 10*3/uL — ABNORMAL HIGH (ref 150–400)
RBC: 3.55 MIL/uL — ABNORMAL LOW (ref 3.87–5.11)
RDW: 23.6 % — ABNORMAL HIGH (ref 11.5–15.5)
WBC: 9.5 10*3/uL (ref 4.0–10.5)
nRBC: 0 % (ref 0.0–0.2)

## 2021-12-14 LAB — URINALYSIS, ROUTINE W REFLEX MICROSCOPIC
Bilirubin Urine: NEGATIVE
Glucose, UA: NEGATIVE mg/dL
Hgb urine dipstick: NEGATIVE
Ketones, ur: NEGATIVE mg/dL
Leukocytes,Ua: NEGATIVE
Nitrite: NEGATIVE
Specific Gravity, Urine: 1.01 (ref 1.005–1.030)
pH: 6 (ref 5.0–8.0)

## 2021-12-14 LAB — COMPREHENSIVE METABOLIC PANEL
ALT: 11 U/L (ref 0–44)
AST: 14 U/L — ABNORMAL LOW (ref 15–41)
Albumin: 2.5 g/dL — ABNORMAL LOW (ref 3.5–5.0)
Alkaline Phosphatase: 160 U/L — ABNORMAL HIGH (ref 38–126)
Anion gap: 10 (ref 5–15)
BUN: 17 mg/dL (ref 6–20)
CO2: 22 mmol/L (ref 22–32)
Calcium: 9.5 mg/dL (ref 8.9–10.3)
Chloride: 110 mmol/L (ref 98–111)
Creatinine, Ser: 0.69 mg/dL (ref 0.44–1.00)
GFR, Estimated: >60 mL/min (ref >60)
Glucose, Bld: 94 mg/dL (ref 70–99)
Potassium: 3 mmol/L — ABNORMAL LOW (ref 3.5–5.1)
Sodium: 142 mmol/L (ref 135–145)
Total Bilirubin: 0.6 mg/dL (ref 0.3–1.2)
Total Protein: 7.7 g/dL (ref 6.5–8.1)

## 2021-12-14 LAB — I-STAT BETA HCG BLOOD, ED (MC, WL, AP ONLY): I-stat hCG, quantitative: <5 m[IU]/mL (ref <5)

## 2021-12-14 MED ORDER — POTASSIUM CHLORIDE 10 MEQ/100ML IV SOLN
10.0000 meq | INTRAVENOUS | Status: AC
  Administered 2021-12-14 – 2021-12-15 (×2): 10 meq via INTRAVENOUS
  Filled 2021-12-14 (×2): qty 100

## 2021-12-14 MED ORDER — POTASSIUM CHLORIDE 10 MEQ/100ML IV SOLN
10.0000 meq | Freq: Once | INTRAVENOUS | Status: AC
  Administered 2021-12-14: 17:00:00 10 meq via INTRAVENOUS
  Filled 2021-12-14: qty 100

## 2021-12-14 MED ORDER — POTASSIUM CHLORIDE 20 MEQ PO PACK
60.0000 meq | PACK | Freq: Once | ORAL | Status: AC
  Administered 2021-12-14: 17:00:00 60 meq via ORAL
  Filled 2021-12-14: qty 3

## 2021-12-14 MED ORDER — SODIUM CHLORIDE 0.9 % IV BOLUS: 500.0000 mL | Freq: Once | INTRAVENOUS | Status: DC

## 2021-12-14 MED ORDER — HYDROMORPHONE HCL 1 MG/ML IJ SOLN
1.0000 mg | Freq: Once | INTRAMUSCULAR | Status: AC
  Administered 2021-12-14: 21:00:00 1 mg via INTRAVENOUS
  Filled 2021-12-14: qty 1

## 2021-12-14 MED ORDER — SODIUM CHLORIDE 0.9 % IV BOLUS
1000.0000 mL | Freq: Once | INTRAVENOUS | Status: AC
  Administered 2021-12-14: 23:00:00 1000 mL via INTRAVENOUS

## 2021-12-14 NOTE — Progress Notes (Signed)
Manufacturing engineer [facility]) Hospital Liaison note.      This is a current active [place] patient from [facility] that was admitted to hospice service on 12/13/2021 with a hospice diagnosis of Cerebrovascular disease. [facility] hospital liaisons will follow for disposition.    A Please do not hesitate to call with questions.     Thank you,    Farrel Gordon, RN, [place] (listed on [facility] under [place])     [phone]

## 2021-12-14 NOTE — ED Triage Notes (Signed)
Per [facility] pt coming from [facility] and rehab for abdominal pain x 1 month along with N/V onset this am. Hx of stroke with left sided deficits. Patient requesting food during triage.

## 2021-12-14 NOTE — ED Provider Notes (Signed)
[facility] DEPT Provider Note   CSN: [id number] Arrival date & time: 12/14/21  1428     History Chief Complaint  Patient presents with   Abdominal Pain    Holly Bradley is a 45 y.o. female.  HPI     45 year old female with history of CHF, SVT, suspected endometritis, degenerating fibroids and stroke comes in with chief complaint of abdominal pain, nausea and vomiting.  Patient resides at [facility].  She was just discharged from the hospital on 12-21, and diagnosed with a degenerating fibroid.  Patient was also enrolled by hospice at that time.  Patient reports that this current abdominal pain started this morning.  She had episode of emesis that was bilious.  She denies any diarrhea.  Review of system is negative for any fevers or chills.  Past Medical History:  Diagnosis Date   CHF (congestive heart failure) (HCC)    Chronic blood loss anemia    Related to uterine fibroids   Essential hypertension    Poorly controlled, repeat by report only on nicardipine 90 mg   Fibroid    Likely complicated by by menometrorrhagia and chronic anemia   Hypertension    Paroxysmal SVT (supraventricular tachycardia) (HCC)    Frequent episodes, can last anywhere from 20 minutes to 12-15 hours.  Not on beta-blocker or calcium channel blocker   Uterine fibroid     Patient Active Problem List   Diagnosis Date Noted   Hypercalcemia 12/04/2021   Acute encephalopathy 12/03/2021   Weakness 11/22/2021   Pressure injury of skin 10/13/2021   Lupus anticoagulant positive    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)    Acute blood loss anemia 10/10/2021   Hypertension    Non-ischemic cardiomyopathy (HCC)    Swelling of left hand    Proctocolitis    Constipation    Uterine fibroid    Left hemiparesis (HCC)    Hemorrhagic stroke (HCC)    Abnormal uterine bleeding (AUB)    Staphylococcus epidermidis bacteremia    Iron  deficiency anemia due to chronic blood loss 05/25/2021   Cerebral edema (HCC) 05/25/2021   Chronic HFrEF (heart failure with reduced ejection fraction) ([place]) 05/25/2021   Pneumonia due to methicillin susceptible Staphylococcus aureus ([place]) 05/25/2021   Acute respiratory failure with hypoxia ([place])    Acute right MCA stroke ([place]) 05/15/2021   Uncontrolled hypertension 05/15/2021   Cerebrovascular accident (CVA) ([place]) 05/15/2021   IDA (iron deficiency anemia) 03/01/2021   Healthcare maintenance 03/01/2021   Onychomycosis 03/01/2021   Food insecurity 02/02/2021   Psychosis ([place])    Fibroid 01/11/2021   Menorrhagia 01/11/2021   Acute exacerbation of CHF (congestive heart failure) ([place]) 01/10/2021   AKI (acute kidney injury) ([place])    Symptomatic anemia    SVT (supraventricular tachycardia) ([place]) 05/01/2020   CHF (congestive heart failure) ([place]) 05/01/2020    Past Surgical History:  Procedure Laterality Date   IR ANGIOGRAM FOLLOW UP STUDY  05/15/2021   IR AORTAGRAM ABDOMINAL SERIALOGRAM  10/16/2021   IR CT HEAD LTD  05/15/2021   IR EMBO ARTERIAL NOT HEMORR HEMANG INC GUIDE ROADMAPPING  10/16/2021   IR PERCUTANEOUS ART THROMBECTOMY/INFUSION INTRACRANIAL INC DIAG ANGIO  05/15/2021       IR PERCUTANEOUS ART THROMBECTOMY/INFUSION INTRACRANIAL INC DIAG ANGIO  05/15/2021   IR TRANSCATH/EMBOLIZ  05/15/2021   IR US GUIDE VASC ACCESS RIGHT  10/16/2021   IVC FILTER INSERTION N/A 10/24/2021   Procedure: IVC  FILTER INSERTION;  Surgeon: Serafina Mitchell, MD;  Location: [facility];  Service: Cardiovascular;  Laterality: N/A;   RADIOLOGY WITH ANESTHESIA N/A 05/15/2021   Procedure: IR WITH ANESTHESIA;  Surgeon: Radiologist, Medication, MD;  [facility];  Service: Radiology;  Laterality: N/A;     OB History     Gravida  0   Para  0   Term  0   Preterm  0   AB  0   Living  0      SAB  0   IAB  0   Ectopic  0   Multiple  0   Live Births  0           Family  History  Problem Relation Age of Onset   Dementia Mother     Social History   Tobacco Use   Smoking status: Every Day   Smokeless tobacco: Never  Vaping Use   Vaping Use: Never used  Substance Use Topics   Alcohol use: Not Currently    Comment: Occasionally   Drug use: Not Currently    Types: Marijuana    Home Medications Prior to Admission medications   Medication Sig Start Date End Date Taking? Authorizing Provider  acetaminophen (TYLENOL) 500 MG tablet Take 2 tablets (1,000 mg total) by mouth in the morning, at noon, and at bedtime. 12/12/21   Wayland Denis, MD  Amino Acids-Protein Hydrolys (FEEDING SUPPLEMENT, PRO-STAT SUGAR FREE 64,) LIQD Take 30 mLs by mouth in the morning and at bedtime.    [provider]  amiodarone (PACERONE) 200 MG tablet Take 200 mg by mouth daily. 02/02/21   [provider]  aspirin 81 MG chewable tablet Chew 1 tablet (81 mg total) by mouth daily. 07/04/21   Samella Parr, NP  atorvastatin (LIPITOR) 40 MG tablet Take 1 tablet (40 mg total) by mouth daily. Patient taking differently: Take 40 mg by mouth at bedtime. 07/04/21   Samella Parr, NP  baclofen (LIORESAL) 20 MG tablet Take 1 tablet (20 mg total) by mouth 3 (three) times daily. 10/27/21   France Ravens, MD  bisacodyl (DULCOLAX) 10 MG suppository Place 10 mg rectally See admin instructions. As needed for constipation x 1 dose if no relief from milk of mag    [provider]  carvedilol (COREG) 25 MG tablet Take 1 tablet (25 mg total) by mouth 2 (two) times daily with a meal. 07/03/21   Samella Parr, NP  cloNIDine (CATAPRES) 0.1 MG tablet Take 1 tablet (0.1 mg total) by mouth 3 (three) times daily. 07/03/21   Samella Parr, NP  escitalopram (LEXAPRO) 20 MG tablet Take 20 mg by mouth daily.    [provider]  feeding supplement (ENSURE ENLIVE / ENSURE PLUS) LIQD Take 237 mLs by mouth 3 (three) times daily between meals. 07/03/21   Samella Parr, NP  ferrous  sulfate 325 (65 FE) MG tablet Take 1 tablet (325 mg total) by mouth 2 (two) times daily with a meal. 02/20/21   Lyda Jester M, PA-C  hydrALAZINE (APRESOLINE) 100 MG tablet Take 1 tablet (100 mg total) by mouth 3 (three) times daily. 03/02/21   Bensimhon, Shaune Pascal, MD  HYDROmorphone HCl (DILAUDID) 1 MG/ML LIQD Take 1 mL (1 mg total) by mouth every 2 (two) hours as needed for severe pain or moderate pain (distress, shortness of breath, RR >25, dyspnea, premedicating prior to turning/moving **Comfort care**). 12/12/21   Wayland Denis, MD  isosorbide mononitrate (IMDUR) 60 MG 24 hr tablet Take 60 mg by mouth daily. 02/02/21   [provider]  lactulose (CHRONULAC) 10 GM/15ML solution Take 15 mLs (10 g total) by mouth 2 (two) times daily. 07/03/21   Samella Parr, NP  linaclotide [facility]) 145 MCG CAPS capsule Take 145 mcg by mouth daily before breakfast.    [provider]  mirtazapine (REMERON) 7.5 MG tablet Take 7.5 mg by mouth at bedtime.    [provider]  Multiple Vitamin (MULTIVITAMIN WITH MINERALS) TABS tablet Take 1 tablet by mouth daily. Patient not taking: Reported on 12/03/2021 07/04/21   Samella Parr, NP  pantoprazole (PROTONIX) 40 MG tablet Take 40 mg by mouth daily.    [provider]  polyethylene glycol (MIRALAX / GLYCOLAX) 17 g packet Take 17 g by mouth daily. 10/27/21   France Ravens, MD  sacubitril-valsartan (ENTRESTO) 97-103 MG Take 1 tablet by mouth 2 (two) times daily. 02/20/21   Lyda Jester M, PA-C  senna-docusate (SENOKOT-S) 8.6-50 MG tablet Take 2 tablets by mouth 2 (two) times daily.    [provider]  simethicone (MYLICON) 40 YK/5.9DJ drops Take 0.6 mLs (40 mg total) by mouth every 6 (six) hours as needed for flatulence. 07/03/21   Samella Parr, NP  Sodium Phosphates (RA SALINE ENEMA RE) Place 1 enema rectally See admin instructions. As needed for constipation if no relief from bisacodyl suppository Patient not taking:  Reported on 12/03/2021    [provider]  torsemide (DEMADEX) 20 MG tablet TAKE 2 TABLETS (40 MG TOTAL) BY MOUTH 2 (TWO) TIMES DAILY. Patient taking differently: Take 40 mg by mouth 2 (two) times daily. 03/02/21 03/02/22  Bensimhon, Shaune Pascal, MD    Allergies    Patient has no known allergies.  Review of Systems   Review of Systems  Constitutional:  Positive for activity change.  Respiratory:  Negative for shortness of breath.   Cardiovascular:  Negative for chest pain.  Gastrointestinal:  Positive for abdominal pain, nausea and vomiting.  Genitourinary:  Negative for dysuria.  Hematological:  Bruises/bleeds easily.  All other systems reviewed and are negative.  Physical Exam Updated Vital Signs BP (!) 159/92    Pulse 75    Temp 99.5 F (37.5 C) (Oral)    Resp 16    Ht 5\' 5"  (1.651 m)    Wt 63.9 kg    SpO2 100%    BMI 23.44 kg/m   Physical Exam Vitals and nursing note reviewed.  Constitutional:      Appearance: She is well-developed.  HENT:     Head: Atraumatic.  Cardiovascular:     Rate and Rhythm: Normal rate.  Pulmonary:     Effort: Pulmonary effort is normal.  Abdominal:     General: There is distension.     Tenderness: There is generalized abdominal tenderness. There is no guarding or rebound.     Comments: Firm abdomen  Musculoskeletal:     Cervical back: Normal range of motion and neck supple.  Skin:    General: Skin is warm and dry.  Neurological:     Mental Status: She is alert and oriented to person, place, and time.    ED Results / Procedures / Treatments   Labs (all labs ordered are listed, but only abnormal results are displayed) Labs Reviewed  CBC WITH DIFFERENTIAL/PLATELET - Abnormal; Notable for the following components:      Result Value   RBC 3.55 (*)  Hemoglobin 9.5 (*)    HCT 30.1 (*)    RDW 23.6 (*)    Platelets 600 (*)    All other components within normal limits  COMPREHENSIVE METABOLIC PANEL - Abnormal; Notable for the  following components:   Potassium 3.0 (*)    Albumin 2.5 (*)    AST 14 (*)    Alkaline Phosphatase 160 (*)    All other components within normal limits  URINALYSIS, ROUTINE W REFLEX MICROSCOPIC  I-STAT BETA HCG BLOOD, ED ([facility], [facility], [facility] ONLY)    EKG None  Radiology No results found.  Procedures Procedures   Medications Ordered in ED Medications - No data to display  ED Course  I have reviewed the triage vital signs and the nursing notes.  Pertinent labs & imaging results that were available during my care of the patient were reviewed by me and considered in my medical decision making (see chart for details).    MDM Rules/Calculators/A&P                          45 year old female comes in with chief complaint of abdominal pain. Recently admitted to the hospital, found to have degenerating fibroids.  Gynecology team is assessing as outpatient.  Also has history of endometritis.  Patient reports nausea and vomiting, possibly bilious. Abdomen is firm.  No peritoneal findings however in the vital signs are within normal limits.  Basic labs ordered, p.o. challenge initiated as the basic labs look good.  CT scan can be ordered if patient fails oral challenge or has intractable pain.  Patient is requesting food, but she is only eligible for liquid diet at this time. Patient's care will be signed out to incoming team.      Final Clinical Impression(s) / ED Diagnoses Final diagnoses:  None    Rx / DC Orders ED Discharge Orders     None        Varney Biles, MD 12/14/21 1702

## 2021-12-14 NOTE — ED Provider Notes (Signed)
4:40 PM-checkout from Dr. Kathrynn Humble to evaluate patient for possible discharge after completion of oral fluid challenge.  Patient presented for evaluation of abdominal pain which is ongoing as well as vomiting, and refusal to eat for 2 days.  She lives in a nursing care facility.  She has had some emesis that was bilious earlier today.  Screening evaluation has been done indicating labs reassuring except potassium low, hemoglobin low, platelets high.  Patient is being followed for endometritis versus degenerating fibroid a source of her pain.  She recently hospitalized for same.  She has been treated in the ED for hypokalemia.  The patient is a DNR with Living Will wishes noted on the EMR.  8:30 PM-awaiting urinalysis results.  Patient now having abdominal pain.  She has not been able to eat or drink anything here yet including the potassium which was ordered orally.  Dilaudid ordered.  10:35 PM-I discussed case with the on-call nurse for [facility], her hospice manager.  They recommend sending patient back to her facility for ongoing management in association with their supervision and guidance.   Daleen Bo, MD 12/14/21 2240

## 2021-12-15 MED ORDER — HYDROMORPHONE HCL 1 MG/ML IJ SOLN
0.5000 mg | Freq: Once | INTRAMUSCULAR | Status: AC
  Administered 2021-12-15: 06:00:00 0.5 mg via INTRAVENOUS
  Filled 2021-12-15: qty 1

## 2021-12-15 NOTE — ED Notes (Signed)
[facility] contacted, pt placed on transport list.

## 2021-12-16 LAB — PTH-RELATED PEPTIDE: PTH-related peptide: <2 pmol/L

## 2021-12-18 NOTE — Telephone Encounter (Signed)
error 

## 2021-12-31 ENCOUNTER — Encounter: Payer: Medicaid Other | Admitting: Internal Medicine

## 2022-01-03 ENCOUNTER — Encounter: Payer: Self-pay | Admitting: Family Medicine

## 2022-01-03 ENCOUNTER — Ambulatory Visit: Payer: Medicaid Other | Admitting: Family Medicine

## 2022-01-03 NOTE — Progress Notes (Signed)
Patient did not keep appointment today. She will be called to reschedule.  

## 2022-01-15 ENCOUNTER — Other Ambulatory Visit: Payer: Self-pay

## 2022-01-15 DIAGNOSIS — I82422 Acute embolism and thrombosis of left iliac vein: Secondary | ICD-10-CM

## 2022-01-28 ENCOUNTER — Inpatient Hospital Stay (HOSPITAL_COMMUNITY): Admit: 2022-01-28 | Payer: Medicaid - Out of State

## 2022-01-28 ENCOUNTER — Encounter: Payer: Medicaid Other | Admitting: Surgery

## 2022-02-21 ENCOUNTER — Other Ambulatory Visit (HOSPITAL_COMMUNITY): Payer: Self-pay

## 2022-02-21 ENCOUNTER — Telehealth (HOSPITAL_COMMUNITY): Payer: Self-pay | Admitting: Pharmacy Technician

## 2022-02-21 NOTE — Telephone Encounter (Signed)
Advanced Heart Failure Patient Advocate Encounter ? ?Received communication from [employer] that its time to renew patient's [place] assistance. The patient has medicaid now. After we get PA approval, no assistance should be needed. ? ?Patient Advocate Encounter ?  ?Received notification from [facility] that prior authorization for Delene Loll is required. ?  ?PA submitted on CoverMyMeds ?[place] ?Status is pending ?  ?Will continue to follow. ? ?

## 2022-02-21 NOTE — Telephone Encounter (Signed)
Advanced Heart Failure Patient Advocate Encounter  Prior Authorization for Delene Loll has been approved.    PA#  [id number] Effective dates: 02/21/22 through 02/21/23

## 2022-06-17 ENCOUNTER — Encounter: Payer: Self-pay | Admitting: *Deleted

## 2022-08-14 ENCOUNTER — Emergency Department (HOSPITAL_COMMUNITY): Payer: Medicaid Other

## 2022-08-14 ENCOUNTER — Inpatient Hospital Stay (HOSPITAL_COMMUNITY)
Admission: EM | Admit: 2022-08-14 | Discharge: 2022-08-20 | DRG: 101 | Disposition: A | Discharge status: Home / Self Care | Payer: Medicaid Other | Attending: Internal Medicine | Admitting: Internal Medicine

## 2022-08-14 ENCOUNTER — Encounter (HOSPITAL_COMMUNITY): Payer: Self-pay

## 2022-08-14 ENCOUNTER — Other Ambulatory Visit: Payer: Self-pay

## 2022-08-14 DIAGNOSIS — Z7401 Bed confinement status: Secondary | ICD-10-CM

## 2022-08-14 DIAGNOSIS — N179 Acute kidney failure, unspecified: Secondary | ICD-10-CM | POA: Diagnosis present

## 2022-08-14 DIAGNOSIS — Z95828 Presence of other vascular implants and grafts: Secondary | ICD-10-CM

## 2022-08-14 DIAGNOSIS — I5042 Chronic combined systolic (congestive) and diastolic (congestive) heart failure: Secondary | ICD-10-CM | POA: Diagnosis present

## 2022-08-14 DIAGNOSIS — R569 Unspecified convulsions: Principal | ICD-10-CM

## 2022-08-14 DIAGNOSIS — D259 Leiomyoma of uterus, unspecified: Secondary | ICD-10-CM | POA: Diagnosis present

## 2022-08-14 DIAGNOSIS — T465X5A Adverse effect of other antihypertensive drugs, initial encounter: Secondary | ICD-10-CM | POA: Diagnosis not present

## 2022-08-14 DIAGNOSIS — F29 Unspecified psychosis not due to a substance or known physiological condition: Secondary | ICD-10-CM | POA: Diagnosis present

## 2022-08-14 DIAGNOSIS — F17210 Nicotine dependence, cigarettes, uncomplicated: Secondary | ICD-10-CM | POA: Diagnosis present

## 2022-08-14 DIAGNOSIS — I10 Essential (primary) hypertension: Secondary | ICD-10-CM | POA: Diagnosis present

## 2022-08-14 DIAGNOSIS — R471 Dysarthria and anarthria: Secondary | ICD-10-CM | POA: Diagnosis present

## 2022-08-14 DIAGNOSIS — I5022 Chronic systolic (congestive) heart failure: Secondary | ICD-10-CM | POA: Diagnosis present

## 2022-08-14 DIAGNOSIS — I471 Supraventricular tachycardia: Secondary | ICD-10-CM | POA: Diagnosis present

## 2022-08-14 DIAGNOSIS — I110 Hypertensive heart disease with heart failure: Secondary | ICD-10-CM | POA: Diagnosis present

## 2022-08-14 DIAGNOSIS — Z20822 Contact with and (suspected) exposure to covid-19: Secondary | ICD-10-CM | POA: Diagnosis present

## 2022-08-14 DIAGNOSIS — Z79899 Other long term (current) drug therapy: Secondary | ICD-10-CM

## 2022-08-14 DIAGNOSIS — R29810 Facial weakness: Secondary | ICD-10-CM | POA: Diagnosis present

## 2022-08-14 DIAGNOSIS — Z86718 Personal history of other venous thrombosis and embolism: Secondary | ICD-10-CM

## 2022-08-14 DIAGNOSIS — I69354 Hemiplegia and hemiparesis following cerebral infarction affecting left non-dominant side: Secondary | ICD-10-CM

## 2022-08-14 DIAGNOSIS — G40909 Epilepsy, unspecified, not intractable, without status epilepticus: Principal | ICD-10-CM | POA: Diagnosis present

## 2022-08-14 DIAGNOSIS — Z7982 Long term (current) use of aspirin: Secondary | ICD-10-CM

## 2022-08-14 DIAGNOSIS — R339 Retention of urine, unspecified: Secondary | ICD-10-CM | POA: Diagnosis present

## 2022-08-14 DIAGNOSIS — E861 Hypovolemia: Secondary | ICD-10-CM | POA: Diagnosis not present

## 2022-08-14 DIAGNOSIS — I69398 Other sequelae of cerebral infarction: Secondary | ICD-10-CM

## 2022-08-14 LAB — CBC WITH DIFFERENTIAL/PLATELET
Abs Immature Granulocytes: 0.02 10*3/uL (ref 0.00–0.07)
Basophils Absolute: 0 10*3/uL (ref 0.0–0.1)
Basophils Relative: 0 %
Eosinophils Absolute: 0.1 10*3/uL (ref 0.0–0.5)
Eosinophils Relative: 2 %
HCT: 37.5 % (ref 36.0–46.0)
Hemoglobin: 12.3 g/dL (ref 12.0–15.0)
Immature Granulocytes: 0 %
Lymphocytes Relative: 20 %
Lymphs Abs: 1.1 10*3/uL (ref 0.7–4.0)
MCH: 31.5 pg (ref 26.0–34.0)
MCHC: 32.8 g/dL (ref 30.0–36.0)
MCV: 95.9 fL (ref 80.0–100.0)
Monocytes Absolute: 0.5 10*3/uL (ref 0.1–1.0)
Monocytes Relative: 8 %
Neutro Abs: 3.9 10*3/uL (ref 1.7–7.7)
Neutrophils Relative %: 70 %
Platelets: 254 10*3/uL (ref 150–400)
RBC: 3.91 MIL/uL (ref 3.87–5.11)
RDW: 15.8 % — ABNORMAL HIGH (ref 11.5–15.5)
WBC: 5.6 10*3/uL (ref 4.0–10.5)
nRBC: 0 % (ref 0.0–0.2)

## 2022-08-14 LAB — PROTIME-INR
INR: 1.1 (ref 0.8–1.2)
Prothrombin Time: 14 seconds (ref 11.4–15.2)

## 2022-08-14 LAB — LACTIC ACID, PLASMA: Lactic Acid, Venous: 1.3 mmol/L (ref 0.5–1.9)

## 2022-08-14 LAB — I-STAT ARTERIAL BLOOD GAS, ED
Acid-Base Excess: 3 mmol/L — ABNORMAL HIGH (ref 0.0–2.0)
Bicarbonate: 28.7 mmol/L — ABNORMAL HIGH (ref 20.0–28.0)
Calcium, Ion: 1.38 mmol/L (ref 1.15–1.40)
HCT: 34 % — ABNORMAL LOW (ref 36.0–46.0)
Hemoglobin: 11.6 g/dL — ABNORMAL LOW (ref 12.0–15.0)
O2 Saturation: 98 %
Potassium: 3.4 mmol/L — ABNORMAL LOW (ref 3.5–5.1)
Sodium: 145 mmol/L (ref 135–145)
TCO2: 30 mmol/L (ref 22–32)
pCO2 arterial: 45.6 mmHg (ref 32–48)
pH, Arterial: 7.406 (ref 7.35–7.45)
pO2, Arterial: 111 mmHg — ABNORMAL HIGH (ref 83–108)

## 2022-08-14 LAB — RESP PANEL BY RT-PCR (FLU A&B, COVID) ARPGX2
Influenza A by PCR: NEGATIVE
Influenza B by PCR: NEGATIVE
SARS Coronavirus 2 by RT PCR: NEGATIVE

## 2022-08-14 LAB — ETHANOL: Alcohol, Ethyl (B): <10 mg/dL (ref <10)

## 2022-08-14 MED ORDER — LACTATED RINGERS IV BOLUS
1000.0000 mL | Freq: Once | INTRAVENOUS | Status: AC
  Administered 2022-08-14: 1000 mL via INTRAVENOUS

## 2022-08-14 MED ORDER — LEVETIRACETAM IN NACL 1000 MG/100ML IV SOLN
1000.0000 mg | Freq: Once | INTRAVENOUS | Status: AC
  Administered 2022-08-14: 1000 mg via INTRAVENOUS
  Filled 2022-08-14: qty 100

## 2022-08-14 NOTE — ED Notes (Signed)
IV placed by IV team doesn't draw back blood enough to send samples. Phlebotomist called to bedside to attempt to collect labs

## 2022-08-14 NOTE — Consult Note (Signed)
Neurology Consultation Reason for Consult: New onset seizures Requesting Physician: Nanda Quinton  CC: Witnessed seizure activity  History is obtained from: Chart review with some details confirmed with patient  HPI: Holly Bradley is a 46 y.o. female with a past medical history significant for right MCA stroke (May 2778 complicated by M3 rupture during thrombectomy requiring embolization) hypertension, prior smoking, uterine fibroids, congestive heart failure, paranoid psychosis.  Per Dr. Doren Custard EDP: "Patient presents for seizure-like activity.  Medical history includes CHF, anemia, psychosis, CVA, DVT, HTN, SVT.  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  She currently resides in a nursing facility.  She is reportedly alert and oriented and conversant at baseline.  She was reportedly last known well several hours prior to arrival.  When EMS arrived on scene, she was minimally responsive and would have intermittent episodes of right-sided convulsive activity.  They also noted right gaze deviation.  She was given 5 mg of intramuscular Versed.  This did stop the seizure activity.  She reportedly has no known seizure history.  No other interventions were given prior to arrival.  Patient was noted to be normotensive initially with EMS and subsequently had soft blood pressures."  Blood pressures recovered spontaneously in the ED, with patient hypertensive at the time of my evaluation.  Her mental status was improving at the time of my evaluation, she was conversant and able to express her wants such as wanting her bed had level adjusted, wanting the TV on, wanting her door left open etc.  She was able to correctly describe that she has had stroke in the past and fibroids and has not had a heart attack, but interestingly she repeatedly said she has had seizures in the past  Premorbid modified rankin scale: 4-5     4 - Moderately severe disability. Unable to attend to own bodily needs without  assistance, and unable to walk unassisted.     5 - Severe disability. Requires constant nursing care and attention, bedridden, incontinent.  ROS: Unreliable as patient did seems somewhat confused still at the time of my evaluation  Past Medical History:  Diagnosis Date   CHF (congestive heart failure) (HCC)    Chronic blood loss anemia    Related to uterine fibroids   Essential hypertension    Poorly controlled, repeat by report only on nicardipine 90 mg   Fibroid    Likely complicated by by menometrorrhagia and chronic anemia   Hypertension    Paroxysmal SVT (supraventricular tachycardia) (HCC)    Frequent episodes, can last anywhere from 20 minutes to 12-15 hours.  Not on beta-blocker or calcium channel blocker   Uterine fibroid    Past Surgical History:  Procedure Laterality Date   IR ANGIOGRAM FOLLOW UP STUDY  05/15/2021   IR AORTAGRAM ABDOMINAL SERIALOGRAM  10/16/2021   IR CT HEAD LTD  05/15/2021   IR EMBO ARTERIAL NOT HEMORR HEMANG INC GUIDE ROADMAPPING  10/16/2021   IR PERCUTANEOUS ART THROMBECTOMY/INFUSION INTRACRANIAL INC DIAG ANGIO  05/15/2021       IR PERCUTANEOUS ART THROMBECTOMY/INFUSION INTRACRANIAL INC DIAG ANGIO  05/15/2021   IR TRANSCATH/EMBOLIZ  05/15/2021   IR US GUIDE VASC ACCESS RIGHT  10/16/2021   IVC FILTER INSERTION N/A 10/24/2021   Procedure: IVC FILTER INSERTION;  Surgeon: Serafina Mitchell, MD;  Location: [facility];  Service: Cardiovascular;  Laterality: N/A;   RADIOLOGY WITH ANESTHESIA N/A 05/15/2021   Procedure: IR WITH ANESTHESIA;  Surgeon: Radiologist, Medication, MD;  Location: [facility];  Service: Radiology;  Laterality: N/A;     Family History  Problem Relation Age of Onset   Dementia Mother     Social History:  reports that she has been smoking. She has never used smokeless tobacco. She reports that she does not currently use alcohol. She reports that she does not currently use drugs after having used the following drugs:  Marijuana.   Exam: Current vital signs: BP (!) 142/80   Pulse (!) 56   Temp 98.3 F (36.8 C) (Rectal)   Resp 12   SpO2 100%  Vital signs in last 24 hours: Temp:  [98.3 F (36.8 C)] 98.3 F (36.8 C) (08/23 1957) Pulse Rate:  [54-64] 56 (08/23 2130) Resp:  [12] 12 (08/23 2000) BP: (115-174)/(68-117) 142/80 (08/23 2130) SpO2:  [98 %-100 %] 100 % (08/23 2130)   Physical Exam  Constitutional: Appears chronically ill Psych: Affect appropriate to situation, calm and cooperative Eyes: No scleral injection HENT: No oropharyngeal obstruction.  MSK: Atrophy worse on the left side.  She holds the right foot in plantarflexion with toes curled Cardiovascular: Perfusing extremities well Respiratory: Effort normal, non-labored breathing GI: Soft.  No distension. There is no tenderness.  Skin: Warm dry and intact visible skin  Neuro: Mental Status: Patient is awake, alert, oriented to person, place, age but not month (reported January, was surprised when I corrected her that it is August) unclear on situation and can give limited history. No signs of aphasia or neglect Cranial Nerves: II: Visual Fields are notable for partial hemianopia of the left side. Pupils are equal, round, and reactive to light.  III,IV, VI: EOMI without ptosis or diploplia.  V: Facial sensation is symmetric to temperature VII: Facial movement is notable for left facial droop VIII: hearing is intact to voice X: Uvula elevates symmetrically XI: Shoulder shrug is symmetric. XII: tongue is midline without atrophy or fasciculations.  Motor: Bulk is reduced throughout but more on the left than the right.  Tone is actually somewhat low in the left upper extremity initially, but did seem to have some spasticity as well.  She has some trace movement in the left arm (grasp reflex) but no movement of the left leg, and cannot resist gravity with the right arm and leg Sensory: She reports sensation is equal in all 4  extremities, no extinction Deep Tendon Reflexes: 2+ and symmetric in the brachioradialis and patellae.  Plantars: Toes are downgoing on the right and mute on the left Cerebellar: Finger-to-nose intact within limits of weakness Gait:  Bedbound at baseline  NIHSS total 18 Score breakdown: One-point for drowsiness, one-point for not answering month correctly, one-point for partial hemianopia on the left side, 2 points for left facial droop, 3 points for left arm weakness, 2 points for right arm weakness, 4 points for left leg weakness, 2 points for right leg weakness, one-point for mild aphasia, one-point for mild dysarthria Performed at 10:55 PM   I have reviewed labs in epic and the results pertinent to this consultation are:  Basic Metabolic Panel: Recent Labs  Lab 08/14/22 2014  NA 145  K 3.4*    CBC: Recent Labs  Lab 08/14/22 2010 08/14/22 2014  WBC 5.6  --   NEUTROABS 3.9  --   HGB 12.3 11.6*  HCT 37.5 34.0*  MCV 95.9  --   PLT 254  --     CXR  1. Low lung volumes with cardiomegaly. 2. Indistinct left hemidiaphragm may be due to airspace disease, atelectasis  or small pleural effusion. 3. Vascular congestion versus bronchovascular crowding.  Coagulation Studies: Recent Labs    08/14/22 Mar 19, 2205  LABPROT 14.0  INR 1.1        I have reviewed the images obtained:  Head CT personally reviewed, agree with radiology: Brain: No acute territorial infarction, hemorrhage or intracranial mass. Extensive white matter hypodensity consistent with chronic small vessel ischemic change. Chronic right MCA infarct with extensive encephalomalacia in the right frontal, parietal, and temporal lobes with ex vacuo dilatation of the ventricle. Chronic infarct left cerebellum. Probable chronic lacunar infarcts in the pons. Chronic lacunar infarcts in the left basal ganglia. Stable ventricle size. Metallic coils in the right temporal lobe.   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  On review of outside records she does not seem to be on antiseizure medications despite her self-reported history of seizures --query if this report is secondary to her being confused.  Interestingly the right-sided shaking and right gaze deviation would localize to left MCA territory instead of right MCA, which is opposite to what I would expect from given her prior left MCA stroke.  Therefore will obtain an MRI brain to exclude any other acute process.  I am also surprised by the low tone on the left side and her examination.  Possibly this is secondary to postictal state or potentially she has received Botox or other treatment for spasticity.  Recommendations: - MRI brain with and without contrast - Continue home aspirin 81 mg daily - Continue home baclofen as missed doses can lead to withdrawal seizures - Keppra 1000 load, then 500 mg BID (can be converted 1:1 to oral when needed). Adjust as needed for renal function; CMP pending at time of this note  CrCl cannot be calculated (Patient's most recent lab result is older than the maximum 21 days allowed.).   CrCl 80 to 130 mL/minute/1.73 m2: 500 mg to 1.5 g every 12 hours.  CrCl 50 to <80 mL/minute/1.73 m2: 500 mg to 1 g every 12 hours.  CrCl 30 to <50 mL/minute/1.73 m2: 250 to 750 mg every 12 hours.  CrCl 15 to <30 mL/minute/1.73 m2: 250 to 500 mg every 12 hours.  CrCl <15 mL/minute/1.73 m2: 250 to 500 mg every 24 hours (expert opinion). - Permissive hypertension pending MRI brain, and subsequently goal normotension.  Consider clonidine withdrawal as an etiology for her current hypertension - Neurology will follow up MRI brain and EEG, otherwise we will be available as needed going forward.  Please reach out if patient is having further events or if additional neurological questions arise   Lesleigh Noe MD-PhD [facility] [phone] Available 7 PM to 7 AM, outside of these hours please call Neurologist on call as  listed on Amion.

## 2022-08-14 NOTE — ED Notes (Signed)
Back from CT at this time was transported by this RN and back

## 2022-08-14 NOTE — ED Triage Notes (Signed)
Per ems patient was having right sided "jerking" per [employer]. Patient was seen having right sided gaze and nystagmus. Patient is normally paralyzed on the left side and bed bound from previous stroke. EMS provided '5mg'$  of versed IM. Patient is warm to the touch. EMS reports hypotension.

## 2022-08-14 NOTE — ED Notes (Signed)
2nd nurse unable to obtain IV x2 provider made aware

## 2022-08-14 NOTE — ED Provider Notes (Signed)
[facility] EMERGENCY DEPARTMENT Provider Note   CSN: [id number] Arrival date & time: 08/14/22  1944     History  Chief Complaint  Patient presents with   Seizures    Holly Bradley is a 46 y.o. female.  HPI Patient presents for seizure-like activity.  Medical history includes CHF, anemia, psychosis, CVA, DVT, HTN, SVT.  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  She currently resides in a nursing facility.  She is reportedly alert and oriented and conversant at baseline.  She was reportedly last known well several hours prior to arrival.  When EMS arrived on scene, she was minimally responsive and would have intermittent episodes of right-sided convulsive activity.  They also noted right gaze deviation.  She was given 5 mg of intramuscular Versed.  This did stop the seizure activity.  She reportedly has no known seizure history.  No other interventions were given prior to arrival.  Patient was noted to be normotensive initially with EMS and subsequently had soft blood pressures.    Home Medications Prior to Admission medications   Medication Sig Start Date End Date Taking? Authorizing Provider  acetaminophen (TYLENOL) 500 MG tablet Take 2 tablets (1,000 mg total) by mouth in the morning, at noon, and at bedtime. 12/12/21   Wayland Denis, MD  Amino Acids-Protein Hydrolys (FEEDING SUPPLEMENT, PRO-STAT SUGAR FREE 64,) LIQD Take 30 mLs by mouth in the morning and at bedtime.    [provider]  amiodarone (PACERONE) 200 MG tablet Take 200 mg by mouth daily. 02/02/21   [provider]  aspirin 81 MG chewable tablet Chew 1 tablet (81 mg total) by mouth daily. 07/04/21   Samella Parr, NP  atorvastatin (LIPITOR) 40 MG tablet Take 1 tablet (40 mg total) by mouth daily. Patient taking differently: Take 40 mg by mouth at bedtime. 07/04/21   Samella Parr, NP  baclofen (LIORESAL) 20 MG tablet Take 1 tablet (20 mg total) by mouth 3 (three) times  daily. 10/27/21   France Ravens, MD  bisacodyl (DULCOLAX) 10 MG suppository Place 10 mg rectally See admin instructions. As needed for constipation x 1 dose if no relief from milk of mag    [provider]  carvedilol (COREG) 25 MG tablet Take 1 tablet (25 mg total) by mouth 2 (two) times daily with a meal. 07/03/21   Samella Parr, NP  cloNIDine (CATAPRES) 0.1 MG tablet Take 1 tablet (0.1 mg total) by mouth 3 (three) times daily. 07/03/21   Samella Parr, NP  escitalopram (LEXAPRO) 20 MG tablet Take 20 mg by mouth daily.    [provider]  feeding supplement (ENSURE ENLIVE / ENSURE PLUS) LIQD Take 237 mLs by mouth 3 (three) times daily between meals. 07/03/21   Samella Parr, NP  ferrous sulfate 325 (65 FE) MG tablet Take 1 tablet (325 mg total) by mouth 2 (two) times daily with a meal. 02/20/21   Lyda Jester M, PA-C  hydrALAZINE (APRESOLINE) 100 MG tablet Take 1 tablet (100 mg total) by mouth 3 (three) times daily. 03/02/21   Bensimhon, Shaune Pascal, MD  HYDROmorphone HCl (DILAUDID) 1 MG/ML LIQD Take 1 mL (1 mg total) by mouth every 2 (two) hours as needed for severe pain or moderate pain (distress, shortness of breath, RR >25, dyspnea, premedicating prior to turning/moving **Comfort care**). 12/12/21   Wayland Denis, MD  isosorbide mononitrate (IMDUR) 60 MG 24 hr tablet Take 60 mg by mouth daily. 02/02/21  [provider]  lactulose (CHRONULAC) 10 GM/15ML solution Take 15 mLs (10 g total) by mouth 2 (two) times daily. 07/03/21   Samella Parr, NP  linaclotide [facility]) 145 MCG CAPS capsule Take 145 mcg by mouth daily before breakfast.    [provider]  mirtazapine (REMERON) 7.5 MG tablet Take 7.5 mg by mouth at bedtime.    [provider]  Multiple Vitamin (MULTIVITAMIN WITH MINERALS) TABS tablet Take 1 tablet by mouth daily. Patient not taking: Reported on 12/03/2021 07/04/21   Samella Parr, NP  pantoprazole (PROTONIX) 40 MG tablet Take 40 mg  by mouth daily.    [provider]  polyethylene glycol (MIRALAX / GLYCOLAX) 17 g packet Take 17 g by mouth daily. 10/27/21   France Ravens, MD  sacubitril-valsartan (ENTRESTO) 97-103 MG Take 1 tablet by mouth 2 (two) times daily. 02/20/21   Lyda Jester M, PA-C  senna-docusate (SENOKOT-S) 8.6-50 MG tablet Take 2 tablets by mouth 2 (two) times daily.    [provider]  simethicone (MYLICON) 40 JO/8.4ZY drops Take 0.6 mLs (40 mg total) by mouth every 6 (six) hours as needed for flatulence. 07/03/21   Samella Parr, NP  Sodium Phosphates (RA SALINE ENEMA RE) Place 1 enema rectally See admin instructions. As needed for constipation if no relief from bisacodyl suppository Patient not taking: Reported on 12/03/2021    [provider]      Allergies    Patient has no known allergies.    Review of Systems   Review of Systems  Unable to perform ROS: Mental status change    Physical Exam Updated Vital Signs BP (!) 144/73   Pulse (!) 58   Temp 98.3 F (36.8 C) (Rectal)   Resp 12   SpO2 100%  Physical Exam Vitals and nursing note reviewed.  Constitutional:      General: She is not in acute distress.    Appearance: She is well-developed and normal weight. She is ill-appearing. She is not toxic-appearing or diaphoretic.  HENT:     Head: Normocephalic and atraumatic.     Right Ear: External ear normal.     Left Ear: External ear normal.     Nose: Nose normal.     Mouth/Throat:     Mouth: Mucous membranes are moist.     Pharynx: Oropharynx is clear.  Eyes:     Extraocular Movements: Extraocular movements intact.     Conjunctiva/sclera: Conjunctivae normal.  Cardiovascular:     Rate and Rhythm: Normal rate and regular rhythm.  Pulmonary:     Effort: Pulmonary effort is normal. No respiratory distress.     Breath sounds: Normal breath sounds. No wheezing or rales.  Abdominal:     General: There is no distension.     Palpations: Abdomen is soft.      Tenderness: There is no abdominal tenderness.  Musculoskeletal:        General: No swelling or deformity.     Cervical back: Neck supple.     Right lower leg: No edema.     Left lower leg: No edema.  Skin:    General: Skin is warm and dry.  Neurological:     Mental Status: She is alert.     GCS: GCS eye subscore is 1. GCS verbal subscore is 1. GCS motor subscore is 5.  Psychiatric:        Mood and Affect: Mood normal.     ED Results / Procedures / Treatments  Labs (all labs ordered are listed, but only abnormal results are displayed) Labs Reviewed  CBC WITH DIFFERENTIAL/PLATELET - Abnormal; Notable for the following components:      Result Value   RDW 15.8 (*)    All other components within normal limits  I-STAT ARTERIAL BLOOD GAS, ED - Abnormal; Notable for the following components:   pO2, Arterial 111 (*)    Bicarbonate 28.7 (*)    Acid-Base Excess 3.0 (*)    Potassium 3.4 (*)    HCT 34.0 (*)    Hemoglobin 11.6 (*)    All other components within normal limits  RESP PANEL BY RT-PCR (FLU A&B, COVID) ARPGX2  CULTURE, BLOOD (ROUTINE X 2)  CULTURE, BLOOD (ROUTINE X 2)  PROTIME-INR  ETHANOL  LACTIC ACID, PLASMA  RAPID URINE DRUG SCREEN, HOSP PERFORMED  URINALYSIS, ROUTINE W REFLEX MICROSCOPIC  LACTIC ACID, PLASMA  COMPREHENSIVE METABOLIC PANEL  MAGNESIUM  CBG MONITORING, ED  I-STAT BETA HCG BLOOD, ED ([facility], [facility], [facility] ONLY)  I-STAT CHEM 8, ED    EKG EKG Interpretation  Date/Time:  Wednesday August 14 2022 20:02:05 EDT Ventricular Rate:  62 PR Interval:  198 QRS Duration: 110 QT Interval:  487 QTC Calculation: 495 R Axis:   124 Text Interpretation: Sinus rhythm Confirmed by Godfrey Pick [phone]) on 08/14/2022 8:11:37 PM  Radiology DG Chest Port 1 View  Result Date: 08/14/2022 CLINICAL DATA:  Questionable sepsis - evaluate for abnormality EXAM: PORTABLE CHEST 1 VIEW COMPARISON:  Radiograph 12/03/2021 FINDINGS: Lung volumes are low. Cardiomegaly, stable from prior. Left  hemidiaphragm is slightly indistinct, this may be due to airspace disease, atelectasis or small pleural effusion. Bronchovascular crowding versus vascular congestion. No pneumothorax. IMPRESSION: 1. Low lung volumes with cardiomegaly. 2. Indistinct left hemidiaphragm may be due to airspace disease, atelectasis or small pleural effusion. 3. Vascular congestion versus bronchovascular crowding. Electronically Signed   By: Keith Rake M.D.   On: 08/14/2022 21:17   CT HEAD WO CONTRAST  Result Date: 08/14/2022 CLINICAL DATA:  Seizure EXAM: CT HEAD WITHOUT CONTRAST TECHNIQUE: Contiguous axial images were obtained from the base of the skull through the vertex without intravenous contrast. RADIATION DOSE REDUCTION: This exam was performed according to the departmental dose-optimization program which includes automated exposure control, adjustment of the mA and/or kV according to patient size and/or use of iterative reconstruction technique. COMPARISON:  CT 12/03/2021, MRI 12/03/2021 FINDINGS: Brain: No acute territorial infarction, hemorrhage or intracranial mass. Extensive white matter hypodensity consistent with chronic small vessel ischemic change. Chronic right MCA infarct with extensive encephalomalacia in the right frontal, parietal, and temporal lobes with ex vacuo dilatation of the ventricle. Chronic infarct left cerebellum. Probable chronic lacunar infarcts in the pons. Chronic lacunar infarcts in the left basal ganglia. Stable ventricle size. Metallic coils in the right temporal lobe. Vascular: No hyperdense vessels. Vertebral and carotid vascular calcification Skull: Normal. Negative for fracture or focal lesion. Sinuses/Orbits: No acute finding. Other: None IMPRESSION: 1. No CT evidence for acute intracranial abnormality. 2. Multifocal chronic infarcts. Chronic small vessel ischemic changes of the white matter. Electronically Signed   By: Donavan Foil M.D.   On: 08/14/2022 20:52     Procedures Procedures    Medications Ordered in ED Medications  lactated ringers bolus 1,000 mL (1,000 mLs Intravenous New Bag/Given 08/14/22 2308)  levETIRAcetam (KEPPRA) IVPB 1000 mg/100 mL premix (1,000 mg Intravenous New Bag/Given 08/14/22 2312)    ED Course/ Medical Decision Making/ A&P  Medical Decision Making Amount and/or Complexity of Data Reviewed Labs: ordered. Radiology: ordered.   This patient presents to the ED for concern of seizure, this involves an extensive number of treatment options, and is a complaint that carries with it a high risk of complications and morbidity.  The differential diagnosis includes new onset epilepsy, infection, CVA, trauma, polypharmacy, medication withdrawal   Co morbidities that complicate the patient evaluation  CHF, anemia, psychosis, CVA, DVT, HTN, SVT   Additional history obtained:  Additional history obtained from EMS External records from outside source obtained and reviewed including EMR   Lab Tests:  I Ordered, and personally interpreted labs.  The pertinent results include: CBC shows normal hemoglobin and no leukocytosis.  COVID and flu are negative.  Lactate is normal.  (Remaining lab work pending at time of signout).   Imaging Studies ordered:  I ordered imaging studies including chest x-ray, CT head, MRI brain I independently visualized and interpreted imaging which showed no acute findings on x-ray or CT head.  (MRI pending at time of signout). I agree with the radiologist interpretation   Cardiac Monitoring: / EKG:  The patient was maintained on a cardiac monitor.  I personally viewed and interpreted the cardiac monitored which showed an underlying rhythm of: Sinus rhythm   Consultations Obtained:  I requested consultation with the neurologist, Dr. Curly Shores,  and discussed lab and imaging findings as well as pertinent plan - they recommend: Keppra load in the ED.  (Remaining  recommendations pending at time of signout)   Problem List / ED Course / Critical interventions / Medication management  Patient is a 46 year old female with unfortunate history of severe stroke, currently residing in [facility] facility, presenting to the ED for seizure-like activity.  Initial history is provided by EMS.  EMS states that they witnessed intermittent right-sided tonic-clonic movements and right gaze deviation.  It should be noted that patient is paralyzed on the left side at baseline.  Patient was given 5 mg of intramuscular Versed and subsequently had cessation of convulsive movements.  On arrival in the ED, patient is GCS of 7.  She is protecting her airway.  She has no evidence of fever on rectal temperature.  SPO2 is normal on room air.  IV fluids were ordered.  Laboratory work-up, chest x-ray, and CT head were ordered.  On reassessment, patient is now awake and GCS 15.  She is able to answer basic questions only.  I spoke with neurologist on-call, Dr. Curly Shores.  Dr. Curly Shores came and evaluated the patient in the ED.  During this evaluation, patient stated that she had had seizures before.  I am unable to identify any prior records indicating prior seizure episodes.  On review of her facility MAR, does not appear that she is on any AEDs.  I spoke with staff at her nursing facility who stated that they were not aware of any prior seizures.  They also state that patient is conversant at baseline and can carry normal conversation, although her speech is slightly slurred and slightly slowed.  Patient was given 1 g Keppra load, ordered by neurology.  Remaining work-up and remaining recommendations from neurology were pending at time of signout.  Patient was signed out to oncoming ED provider. I ordered medication including IV fluids for hydration Reevaluation of the patient after these medicines showed that the patient improved I have reviewed the patients home medicines and have made  adjustments as needed   Social Determinants of Health:  Bedbound at baseline, currently resides in nursing facility        Final Clinical Impression(s) / ED Diagnoses Final diagnoses:  Seizure-like activity [facility])    Rx / Lucien Orders ED Discharge Orders     None         Godfrey Pick, MD 08/14/22 2357

## 2022-08-14 NOTE — ED Notes (Signed)
This RN attempted to draw blood and obtain IV x2 without success. Another nurse is attempting to draw labs & obtain IV at this time while awaiting IV team who has been consulted

## 2022-08-15 ENCOUNTER — Emergency Department (HOSPITAL_COMMUNITY): Payer: Medicaid Other

## 2022-08-15 ENCOUNTER — Inpatient Hospital Stay (HOSPITAL_COMMUNITY): Payer: Medicaid Other

## 2022-08-15 DIAGNOSIS — Z95828 Presence of other vascular implants and grafts: Secondary | ICD-10-CM | POA: Diagnosis not present

## 2022-08-15 DIAGNOSIS — E861 Hypovolemia: Secondary | ICD-10-CM | POA: Diagnosis not present

## 2022-08-15 DIAGNOSIS — T465X5A Adverse effect of other antihypertensive drugs, initial encounter: Secondary | ICD-10-CM | POA: Diagnosis not present

## 2022-08-15 DIAGNOSIS — I69398 Other sequelae of cerebral infarction: Secondary | ICD-10-CM | POA: Diagnosis not present

## 2022-08-15 DIAGNOSIS — N179 Acute kidney failure, unspecified: Secondary | ICD-10-CM | POA: Diagnosis present

## 2022-08-15 DIAGNOSIS — F29 Unspecified psychosis not due to a substance or known physiological condition: Secondary | ICD-10-CM | POA: Diagnosis present

## 2022-08-15 DIAGNOSIS — D259 Leiomyoma of uterus, unspecified: Secondary | ICD-10-CM | POA: Diagnosis present

## 2022-08-15 DIAGNOSIS — I69354 Hemiplegia and hemiparesis following cerebral infarction affecting left non-dominant side: Secondary | ICD-10-CM | POA: Diagnosis not present

## 2022-08-15 DIAGNOSIS — G40909 Epilepsy, unspecified, not intractable, without status epilepticus: Secondary | ICD-10-CM | POA: Diagnosis present

## 2022-08-15 DIAGNOSIS — R569 Unspecified convulsions: Secondary | ICD-10-CM

## 2022-08-15 DIAGNOSIS — R339 Retention of urine, unspecified: Secondary | ICD-10-CM | POA: Diagnosis present

## 2022-08-15 DIAGNOSIS — I471 Supraventricular tachycardia: Secondary | ICD-10-CM | POA: Diagnosis present

## 2022-08-15 DIAGNOSIS — Z7401 Bed confinement status: Secondary | ICD-10-CM | POA: Diagnosis not present

## 2022-08-15 DIAGNOSIS — R471 Dysarthria and anarthria: Secondary | ICD-10-CM | POA: Diagnosis present

## 2022-08-15 DIAGNOSIS — Z79899 Other long term (current) drug therapy: Secondary | ICD-10-CM | POA: Diagnosis not present

## 2022-08-15 DIAGNOSIS — Z20822 Contact with and (suspected) exposure to covid-19: Secondary | ICD-10-CM | POA: Diagnosis present

## 2022-08-15 DIAGNOSIS — I10 Essential (primary) hypertension: Secondary | ICD-10-CM

## 2022-08-15 DIAGNOSIS — F17210 Nicotine dependence, cigarettes, uncomplicated: Secondary | ICD-10-CM | POA: Diagnosis present

## 2022-08-15 DIAGNOSIS — I5022 Chronic systolic (congestive) heart failure: Secondary | ICD-10-CM | POA: Diagnosis not present

## 2022-08-15 DIAGNOSIS — I110 Hypertensive heart disease with heart failure: Secondary | ICD-10-CM | POA: Diagnosis present

## 2022-08-15 DIAGNOSIS — I5032 Chronic diastolic (congestive) heart failure: Secondary | ICD-10-CM | POA: Diagnosis not present

## 2022-08-15 DIAGNOSIS — R29810 Facial weakness: Secondary | ICD-10-CM | POA: Diagnosis present

## 2022-08-15 DIAGNOSIS — Z7982 Long term (current) use of aspirin: Secondary | ICD-10-CM | POA: Diagnosis not present

## 2022-08-15 DIAGNOSIS — Z86718 Personal history of other venous thrombosis and embolism: Secondary | ICD-10-CM | POA: Diagnosis not present

## 2022-08-15 DIAGNOSIS — I5042 Chronic combined systolic (congestive) and diastolic (congestive) heart failure: Secondary | ICD-10-CM | POA: Diagnosis present

## 2022-08-15 LAB — COMPREHENSIVE METABOLIC PANEL
ALT: 26 U/L (ref 0–44)
AST: 20 U/L (ref 15–41)
Albumin: 4.4 g/dL (ref 3.5–5.0)
Alkaline Phosphatase: 88 U/L (ref 38–126)
Anion gap: 12 (ref 5–15)
BUN: 51 mg/dL — ABNORMAL HIGH (ref 6–20)
CO2: 25 mmol/L (ref 22–32)
Calcium: 11.3 mg/dL — ABNORMAL HIGH (ref 8.9–10.3)
Chloride: 109 mmol/L (ref 98–111)
Creatinine, Ser: 2.04 mg/dL — ABNORMAL HIGH (ref 0.44–1.00)
GFR, Estimated: 30 mL/min — ABNORMAL LOW (ref >60)
Glucose, Bld: 90 mg/dL (ref 70–99)
Potassium: 3.6 mmol/L (ref 3.5–5.1)
Sodium: 146 mmol/L — ABNORMAL HIGH (ref 135–145)
Total Bilirubin: 0.6 mg/dL (ref 0.3–1.2)
Total Protein: 8.4 g/dL — ABNORMAL HIGH (ref 6.5–8.1)

## 2022-08-15 LAB — RAPID URINE DRUG SCREEN, HOSP PERFORMED
Amphetamines: NOT DETECTED
Barbiturates: NOT DETECTED
Benzodiazepines: POSITIVE — AB
Cocaine: NOT DETECTED
Opiates: NOT DETECTED
Tetrahydrocannabinol: NOT DETECTED

## 2022-08-15 LAB — MAGNESIUM: Magnesium: 2.2 mg/dL (ref 1.7–2.4)

## 2022-08-15 LAB — CK: Total CK: 126 U/L (ref 38–234)

## 2022-08-15 LAB — CBC
HCT: 38.7 % (ref 36.0–46.0)
Hemoglobin: 12.6 g/dL (ref 12.0–15.0)
MCH: 31.3 pg (ref 26.0–34.0)
MCHC: 32.6 g/dL (ref 30.0–36.0)
MCV: 96.3 fL (ref 80.0–100.0)
Platelets: 190 10*3/uL (ref 150–400)
RBC: 4.02 MIL/uL (ref 3.87–5.11)
RDW: 14.4 % (ref 11.5–15.5)
WBC: 7.2 10*3/uL (ref 4.0–10.5)
nRBC: 0 % (ref 0.0–0.2)

## 2022-08-15 LAB — URINALYSIS, ROUTINE W REFLEX MICROSCOPIC
Bilirubin Urine: NEGATIVE
Glucose, UA: NEGATIVE mg/dL
Hgb urine dipstick: NEGATIVE
Ketones, ur: NEGATIVE mg/dL
Leukocytes,Ua: NEGATIVE
Nitrite: NEGATIVE
Protein, ur: NEGATIVE mg/dL
Specific Gravity, Urine: 1.009 (ref 1.005–1.030)
pH: 6 (ref 5.0–8.0)

## 2022-08-15 LAB — CBG MONITORING, ED: Glucose-Capillary: 112 mg/dL — ABNORMAL HIGH (ref 70–99)

## 2022-08-15 LAB — GLUCOSE, CAPILLARY: Glucose-Capillary: 122 mg/dL — ABNORMAL HIGH (ref 70–99)

## 2022-08-15 MED ORDER — LINACLOTIDE 145 MCG PO CAPS
145.0000 ug | ORAL_CAPSULE | Freq: Every day | ORAL | Status: DC
  Administered 2022-08-17 – 2022-08-20 (×4): 145 ug via ORAL
  Filled 2022-08-15 (×6): qty 1

## 2022-08-15 MED ORDER — MUSCLE RUB 10-15 % EX CREA: 1.0000 | TOPICAL_CREAM | Freq: Two times a day (BID) | CUTANEOUS | Status: DC | PRN

## 2022-08-15 MED ORDER — HYDROMORPHONE HCL 1 MG/ML PO LIQD
1.0000 mg | ORAL | Status: DC | PRN
  Filled 2022-08-15: qty 1

## 2022-08-15 MED ORDER — GADOBUTROL 1 MMOL/ML IV SOLN
6.4000 mL | Freq: Once | INTRAVENOUS | Status: AC | PRN
  Administered 2022-08-15: 6.4 mL via INTRAVENOUS

## 2022-08-15 MED ORDER — GABAPENTIN 300 MG PO CAPS
300.0000 mg | ORAL_CAPSULE | Freq: Every day | ORAL | Status: DC
  Administered 2022-08-15 – 2022-08-19 (×5): 300 mg via ORAL
  Filled 2022-08-15 (×5): qty 1

## 2022-08-15 MED ORDER — ASPIRIN 81 MG PO CHEW
81.0000 mg | CHEWABLE_TABLET | Freq: Every day | ORAL | Status: DC
  Administered 2022-08-15 – 2022-08-20 (×6): 81 mg via ORAL
  Filled 2022-08-15 (×6): qty 1

## 2022-08-15 MED ORDER — PANTOPRAZOLE 2 MG/ML SUSPENSION
40.0000 mg | Freq: Every day | ORAL | Status: DC
  Administered 2022-08-16 – 2022-08-20 (×5): 40 mg via ORAL
  Filled 2022-08-15 (×6): qty 20

## 2022-08-15 MED ORDER — ONDANSETRON HCL 4 MG PO TABS
4.0000 mg | ORAL_TABLET | Freq: Three times a day (TID) | ORAL | Status: DC | PRN
Start: 2022-08-15 — End: 2022-08-20

## 2022-08-15 MED ORDER — RA SALINE ENEMA 19-7 GM/118ML RE ENEM: ENEMA | RECTAL | Status: DC | PRN

## 2022-08-15 MED ORDER — CLONIDINE HCL 0.1 MG PO TABS
0.1000 mg | ORAL_TABLET | Freq: Three times a day (TID) | ORAL | Status: DC
  Administered 2022-08-15 (×2): 0.1 mg via ORAL
  Filled 2022-08-15 (×2): qty 1

## 2022-08-15 MED ORDER — HYDRALAZINE HCL 20 MG/ML IJ SOLN: 20.0000 mg | Freq: Four times a day (QID) | INTRAMUSCULAR | Status: DC | PRN

## 2022-08-15 MED ORDER — DEXTROSE 5 % IV SOLN: INTRAVENOUS | Status: AC

## 2022-08-15 MED ORDER — BISACODYL 10 MG RE SUPP
10.0000 mg | RECTAL | Status: DC | PRN
Start: 2022-08-15 — End: 2022-08-20
  Administered 2022-08-17: 10 mg via RECTAL
  Filled 2022-08-15: qty 1

## 2022-08-15 MED ORDER — ATORVASTATIN CALCIUM 40 MG PO TABS
40.0000 mg | ORAL_TABLET | Freq: Every day | ORAL | Status: DC
  Administered 2022-08-15 – 2022-08-20 (×6): 40 mg via ORAL
  Filled 2022-08-15 (×6): qty 1

## 2022-08-15 MED ORDER — HYDRALAZINE HCL 50 MG PO TABS
100.0000 mg | ORAL_TABLET | Freq: Three times a day (TID) | ORAL | Status: DC
  Administered 2022-08-15 (×2): 100 mg via ORAL
  Filled 2022-08-15 (×2): qty 2

## 2022-08-15 MED ORDER — POLYETHYLENE GLYCOL 3350 17 G PO PACK
17.0000 g | PACK | Freq: Every day | ORAL | Status: DC
  Administered 2022-08-15 – 2022-08-20 (×6): 17 g via ORAL
  Filled 2022-08-15 (×6): qty 1

## 2022-08-15 MED ORDER — ISOSORBIDE MONONITRATE ER 30 MG PO TB24: 60.0000 mg | ORAL_TABLET | Freq: Every day | ORAL | Status: DC

## 2022-08-15 MED ORDER — MELATONIN 3 MG PO TABS
6.0000 mg | ORAL_TABLET | Freq: Every day | ORAL | Status: DC
  Administered 2022-08-15 – 2022-08-19 (×5): 6 mg via ORAL
  Filled 2022-08-15 (×5): qty 2

## 2022-08-15 MED ORDER — CHLORHEXIDINE GLUCONATE 0.12% ORAL RINSE (MEDLINE KIT): 15.0000 mL | Freq: Two times a day (BID) | OROMUCOSAL | Status: DC

## 2022-08-15 MED ORDER — PANTOPRAZOLE SODIUM 40 MG PO TBEC
40.0000 mg | DELAYED_RELEASE_TABLET | Freq: Every day | ORAL | Status: DC
  Filled 2022-08-15: qty 1

## 2022-08-15 MED ORDER — ENSURE ENLIVE PO LIQD
237.0000 mL | Freq: Three times a day (TID) | ORAL | Status: DC
  Administered 2022-08-15 – 2022-08-20 (×12): 237 mL via ORAL
  Filled 2022-08-15 (×2): qty 237
  Filled 2022-08-15: qty 474

## 2022-08-15 MED ORDER — ADULT MULTIVITAMIN W/MINERALS CH
1.0000 | ORAL_TABLET | Freq: Every day | ORAL | Status: DC
  Administered 2022-08-15 – 2022-08-20 (×6): 1 via ORAL
  Filled 2022-08-15 (×6): qty 1

## 2022-08-15 MED ORDER — LACTULOSE 10 GM/15ML PO SOLN
10.0000 g | Freq: Two times a day (BID) | ORAL | Status: DC
  Administered 2022-08-15 – 2022-08-20 (×11): 10 g via ORAL
  Filled 2022-08-15 (×11): qty 30

## 2022-08-15 MED ORDER — LORAZEPAM 0.5 MG PO TABS
0.5000 mg | ORAL_TABLET | Freq: Three times a day (TID) | ORAL | Status: DC | PRN
  Administered 2022-08-15 – 2022-08-19 (×3): 0.5 mg via ORAL
  Filled 2022-08-15 (×3): qty 1

## 2022-08-15 MED ORDER — BACLOFEN 10 MG PO TABS
20.0000 mg | ORAL_TABLET | Freq: Three times a day (TID) | ORAL | Status: DC
  Administered 2022-08-15 – 2022-08-20 (×15): 20 mg via ORAL
  Filled 2022-08-15 (×15): qty 2

## 2022-08-15 MED ORDER — FUROSEMIDE 10 MG/ML IJ SOLN
20.0000 mg | Freq: Once | INTRAMUSCULAR | Status: AC
  Administered 2022-08-15: 20 mg via INTRAVENOUS
  Filled 2022-08-15: qty 2

## 2022-08-15 MED ORDER — TORSEMIDE 20 MG PO TABS
20.0000 mg | ORAL_TABLET | Freq: Every day | ORAL | Status: DC
Start: 2022-08-15 — End: 2022-08-16
  Administered 2022-08-15: 20 mg via ORAL
  Filled 2022-08-15: qty 1

## 2022-08-15 MED ORDER — ISOSORBIDE MONONITRATE 20 MG PO TABS
30.0000 mg | ORAL_TABLET | Freq: Two times a day (BID) | ORAL | Status: DC
Start: 2022-08-15 — End: 2022-08-16
  Administered 2022-08-15: 30 mg via ORAL
  Filled 2022-08-15 (×2): qty 2

## 2022-08-15 MED ORDER — AMIODARONE HCL 200 MG PO TABS
200.0000 mg | ORAL_TABLET | Freq: Every day | ORAL | Status: DC
  Administered 2022-08-15 – 2022-08-20 (×6): 200 mg via ORAL
  Filled 2022-08-15 (×6): qty 1

## 2022-08-15 MED ORDER — ACETAMINOPHEN 500 MG PO TABS
1000.0000 mg | ORAL_TABLET | Freq: Three times a day (TID) | ORAL | Status: DC
  Administered 2022-08-15 – 2022-08-20 (×15): 1000 mg via ORAL
  Filled 2022-08-15 (×15): qty 2

## 2022-08-15 MED ORDER — CLONIDINE HCL 0.1 MG/24HR TD PTWK
0.1000 mg | MEDICATED_PATCH | TRANSDERMAL | Status: DC
  Administered 2022-08-15: 0.1 mg via TRANSDERMAL
  Filled 2022-08-15: qty 1

## 2022-08-15 MED ORDER — ORAL CARE MOUTH RINSE
15.0000 mL | OROMUCOSAL | Status: DC
  Administered 2022-08-15 – 2022-08-16 (×10): 15 mL via OROMUCOSAL

## 2022-08-15 MED ORDER — MIRTAZAPINE 15 MG PO TABS
7.5000 mg | ORAL_TABLET | Freq: Every day | ORAL | Status: DC
  Administered 2022-08-15 – 2022-08-19 (×5): 7.5 mg via ORAL
  Filled 2022-08-15 (×5): qty 1

## 2022-08-15 MED ORDER — LACTATED RINGERS IV BOLUS
500.0000 mL | Freq: Once | INTRAVENOUS | Status: AC
Start: 2022-08-16 — End: 2022-08-16
  Administered 2022-08-15: 500 mL via INTRAVENOUS

## 2022-08-15 MED ORDER — ESCITALOPRAM OXALATE 10 MG PO TABS
20.0000 mg | ORAL_TABLET | Freq: Every day | ORAL | Status: DC
  Administered 2022-08-15 – 2022-08-20 (×6): 20 mg via ORAL
  Filled 2022-08-15 (×6): qty 2

## 2022-08-15 MED ORDER — FERROUS SULFATE 325 (65 FE) MG PO TABS
325.0000 mg | ORAL_TABLET | Freq: Two times a day (BID) | ORAL | Status: DC
  Administered 2022-08-15 – 2022-08-20 (×10): 325 mg via ORAL
  Filled 2022-08-15 (×10): qty 1

## 2022-08-15 MED ORDER — SENNOSIDES-DOCUSATE SODIUM 8.6-50 MG PO TABS
2.0000 | ORAL_TABLET | Freq: Two times a day (BID) | ORAL | Status: DC
  Administered 2022-08-15 – 2022-08-20 (×11): 2 via ORAL
  Filled 2022-08-15 (×11): qty 2

## 2022-08-15 MED ORDER — PRO-STAT SUGAR FREE PO LIQD
30.0000 mL | Freq: Every day | ORAL | Status: DC
  Filled 2022-08-15: qty 30

## 2022-08-15 MED ORDER — LEVETIRACETAM IN NACL 500 MG/100ML IV SOLN
500.0000 mg | Freq: Two times a day (BID) | INTRAVENOUS | Status: DC
  Administered 2022-08-15: 500 mg via INTRAVENOUS
  Filled 2022-08-15 (×4): qty 100

## 2022-08-15 MED ORDER — ENOXAPARIN SODIUM 40 MG/0.4ML IJ SOSY: 40.0000 mg | PREFILLED_SYRINGE | Freq: Every day | INTRAMUSCULAR | Status: DC

## 2022-08-15 MED ORDER — SIMETHICONE 40 MG/0.6ML PO SUSP: 40.0000 mg | Freq: Four times a day (QID) | ORAL | Status: DC | PRN

## 2022-08-15 MED ORDER — CARVEDILOL 12.5 MG PO TABS
25.0000 mg | ORAL_TABLET | Freq: Two times a day (BID) | ORAL | Status: DC
  Administered 2022-08-15: 25 mg via ORAL
  Filled 2022-08-15: qty 2

## 2022-08-15 MED ORDER — SACUBITRIL-VALSARTAN 49-51 MG PO TABS
1.0000 | ORAL_TABLET | Freq: Every day | ORAL | Status: DC
  Administered 2022-08-15: 1 via ORAL
  Filled 2022-08-15: qty 1

## 2022-08-15 NOTE — Evaluation (Signed)
Physical Therapy Evaluation Patient Details Name: Holly Bradley MRN: [id number] DOB: 03/28/1976 Today's Date: 08/15/2022  History of Present Illness  Pt is a 46 y/o female admitted from LTC secondary to seizure. MRI negative. PMH includes CVA with L deficits, DVT, CHF, and HTN.  Clinical Impression  Pt admitted secondary to problem above with deficits below. Pt with L sided weakness at baseline. Requiring total A for bed mobility tasks. Pt from long term care facility where she is total A for transfers and mobility tasks. Recommend returning at d/c. No further acute PT needs as pt at baseline. Will sign off. If needs change, please reconsult.        Recommendations for follow up therapy are one component of a multi-disciplinary discharge planning process, led by the attending physician.  Recommendations may be updated based on patient status, additional functional criteria and insurance authorization.  Follow Up Recommendations Long-term institutional care without follow-up therapy Can patient physically be transported by private vehicle: No    Assistance Recommended at Discharge Frequent or constant Supervision/Assistance  Patient can return home with the following  Two people to help with walking and/or transfers;Two people to help with bathing/dressing/bathroom;Assistance with cooking/housework;Assist for transportation;Help with stairs or ramp for entrance    Equipment Recommendations None recommended by PT  Recommendations for Other Services       Functional Status Assessment Patient has not had a recent decline in their functional status     Precautions / Restrictions Precautions Precautions: Fall Restrictions Weight Bearing Restrictions: No      Mobility  Bed Mobility Overal bed mobility: Needs Assistance Bed Mobility: Rolling Rolling: Total assist         General bed mobility comments: Total A for rolling for linen change and repositioning.    Transfers                         Ambulation/Gait                  Stairs            Wheelchair Mobility    Modified Rankin (Stroke Patients Only)       Balance                                             Pertinent Vitals/Pain Pain Assessment Pain Assessment: No/denies pain    Home Living Family/patient expects to be discharged to:: Skilled nursing facility                   Additional Comments: From [facility]    Prior Function Prior Level of Function : Needs assist             Mobility Comments: Uses hoyer lift for transfers ADLs Comments: Pt requires assist for all ADLs except self feeding     Hand Dominance        Extremity/Trunk Assessment   Upper Extremity Assessment Upper Extremity Assessment: LUE deficits/detail LUE Deficits / Details: LUE flaccidity at baseline    Lower Extremity Assessment Lower Extremity Assessment: LLE deficits/detail LLE Deficits / Details: LLE weakness at baseline. PF contractures noted       Communication   Communication: Expressive difficulties  Cognition Arousal/Alertness: Awake/alert Behavior During Therapy: Flat affect Overall Cognitive Status: History of cognitive impairments - at baseline  General Comments      Exercises     Assessment/Plan    PT Assessment Patient does not need any further PT services  PT Problem List         PT Treatment Interventions      PT Goals (Current goals can be found in the Care Plan section)  Acute Rehab PT Goals Patient Stated Goal: to go back to LTC PT Goal Formulation: With patient Time For Goal Achievement: 08/15/22 Potential to Achieve Goals: Good    Frequency       Co-evaluation               AM-PAC PT "6 Clicks" Mobility  Outcome Measure Help needed turning from your back to your side while in a flat bed without using bedrails?: Total Help needed moving  from lying on your back to sitting on the side of a flat bed without using bedrails?: Total Help needed moving to and from a bed to a chair (including a wheelchair)?: Total Help needed standing up from a chair using your arms (e.g., wheelchair or bedside chair)?: Total Help needed to walk in hospital room?: Total Help needed climbing 3-5 steps with a railing? : Total 6 Click Score: 6    End of Session   Activity Tolerance: Patient tolerated treatment well Patient left: in bed;with call bell/phone within reach;with nursing/sitter in room (on stretcher in ED) Nurse Communication: Mobility status PT Visit Diagnosis: Other symptoms and signs involving the nervous system (R29.898);Difficulty in walking, not elsewhere classified (R26.2)    Time: 4098-1191 PT Time Calculation (min) (ACUTE ONLY): 15 min   Charges:   PT Evaluation $PT Eval Moderate Complexity: 1 Mod          Reuel Derby, PT, DPT  Acute Rehabilitation Services  Office: [phone]   Rudean Hitt 08/15/2022, 2:36 PM

## 2022-08-15 NOTE — Progress Notes (Signed)
Pt w/ BP 85/52 MAP 60 after receiving HS BP meds. Sridharan (IM) informed.

## 2022-08-15 NOTE — Progress Notes (Signed)
HD#0 Subjective:   Summary: Holly Bradley is a 46 y.o. 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  Overnight Events: No acute events  Patient is doing better today.  She says that she is a little drowsy but otherwise feels like herself.  She states that her appetite is returned.  Her brother is in the room and states that she does appear drowsy and has not eaten yet which she is worried about.  She was able to eat a little bit while we are in the room.  Objective:  Vital signs in last 24 hours: Vitals:   08/15/22 1527 08/15/22 1530 08/15/22 1630 08/15/22 1730  BP: (!) 168/107 (!) 192/101 104/61 124/68  Pulse: 70 70 78 75  Resp: '17 16 16 17  '$ Temp: 98.2 F (36.8 C)     TempSrc: Oral     SpO2: 100% 100% 98% 98%   Supplemental O2: Room Air SpO2: 98 %   Physical Exam:  Constitutional: Tired female sitting in bed, in no acute distress HENT: normocephalic atraumatic, mucous membranes moist Cardiovascular: regular rate and rhythm Pulmonary/Chest: normal work of breathing on room air Abdominal: soft, non-tender, non-distended MSK: normal bulk and tone Neurological: alert & oriented x 3, 4/5 strength right upper and lower extremity, 0-1/5 strength in the left upper and lower extremity, cranial nerves II through XII grossly intact Skin: warm and dry  There were no vitals filed for this visit.   Intake/Output Summary (Last 24 hours) at 08/15/2022 1924 Last data filed at 08/15/2022 1509 Gross per 24 hour  Intake 216.19 ml  Output 1550 ml  Net -1333.81 ml   Net IO Since Admission: -1,333.81 mL [08/15/22 1924]  Pertinent Labs:    Latest Ref Rng & Units 08/15/2022    4:57 AM 08/14/2022    8:14 PM 08/14/2022    8:10 PM  CBC  WBC 4.0 - 10.5 K/uL 7.2   5.6   Hemoglobin 12.0 - 15.0 g/dL 12.6  11.6  12.3    Hematocrit 36.0 - 46.0 % 38.7  34.0  37.5   Platelets 150 - 400 K/uL 190   254        Latest Ref Rng & Units 08/15/2022    4:55 AM 08/14/2022    8:14 PM 12/14/2021    4:10 PM  CMP  Glucose 70 - 99 mg/dL 90   94   BUN 6 - 20 mg/dL 51   17   Creatinine 0.44 - 1.00 mg/dL 2.04   0.69   Sodium 135 - 145 mmol/L 146  145  142   Potassium 3.5 - 5.1 mmol/L 3.6  3.4  3.0   Chloride 98 - 111 mmol/L 109   110   CO2 22 - 32 mmol/L 25   22   Calcium 8.9 - 10.3 mg/dL 11.3   9.5   Total Protein 6.5 - 8.1 g/dL 8.4   7.7   Total Bilirubin 0.3 - 1.2 mg/dL 0.6   0.6   Alkaline Phos 38 - 126 U/L 88   160   AST 15 - 41 U/L 20   14   ALT 0 - 44 U/L 26   11     Imaging: EEG adult  Result Date: 08/15/2022 Holly Havens, MD     08/15/2022  8:57 AM [name] MRN:  [id number] Epilepsy Attending: Lora Bradley Referring Physician/Provider: Lorenza Chick, MD Date: 08/15/2022 Duration: 21.15 mins Patient history: 46 y.o. 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 EEG to evaluate for seizure Level of alertness: Awake AEDs during EEG study: LEV Technical aspects: This EEG study was done with scalp electrodes positioned according to the 10-20 International system of electrode placement. Electrical activity was reviewed with band pass filter of 1-'70Hz'$ , sensitivity of 7 uV/mm, display speed of 13m/sec with a '60Hz'$  notched filter applied as appropriate. EEG data were recorded continuously and digitally stored.  Video monitoring was available and reviewed as appropriate. Description: The posterior dominant rhythm consists of 8-9 Hz activity of moderate voltage (25-35 uV) seen predominantly in posterior head regions, symmetric and reactive to eye opening and eye closing. Hyperventilation and photic stimulation were not performed.   Of note, study was technically difficult due  to significant electrode and movement artifact. IMPRESSION: This technically difficult study is within normal limits. No seizures or epileptiform discharges were seen throughout the recording. A normal interictal EEG does not exclude nor support the diagnosis of epilepsy. If suspicion for interictal activity remains a concern, a prolonged study can be considered. PLora Bradley  MR BRAIN W WO CONTRAST  Result Date: 08/15/2022 CLINICAL DATA:  New onset seizure EXAM: MRI HEAD WITHOUT AND WITH CONTRAST TECHNIQUE: Multiplanar, multiecho pulse sequences of the brain and surrounding structures were obtained without and with intravenous contrast. CONTRAST:  6.437mGADAVIST GADOBUTROL 1 MMOL/ML IV SOLN COMPARISON:  12/03/2021 FINDINGS: Brain: No acute infarct, mass effect or extra-axial collection. Hemosiderin deposition over the right hemisphere. No acute fracture. Large area of encephalomalacia in the right MCA territory. Moderate bilateral white matter hyperintense T2-weighted signal. Moderate generalized atrophy. There is an old left cerebellar infarct. The midline structures are normal. There is no abnormal contrast enhancement. Vascular: Major flow voids are preserved. Skull and upper cervical spine: Normal calvarium and skull base. Visualized upper cervical spine and soft tissues are normal. Sinuses/Orbits:No paranasal sinus fluid levels or advanced mucosal thickening. No mastoid or middle ear effusion. Normal orbits. IMPRESSION: 1. No acute intracranial abnormality. 2. Large area of encephalomalacia in the right MCA territory. 3. Moderate generalized atrophy and chronic small vessel disease. Electronically Signed   By: KeUlyses Jarred.D.   On: 08/15/2022 02:34   DG Abd Portable 1 View  Result Date: 08/15/2022 CLINICAL DATA:  Clearance for MRI EXAM: PORTABLE ABDOMEN - 1 VIEW COMPARISON:  12/03/2021 FINDINGS: Retrievable Celect IVC filter. No additional radiopaque foreign bodies. Nonobstructive bowel gas  pattern. No organomegaly or free air. IMPRESSION: Retrievable Celect IVC filter. No acute findings. Electronically Signed   By: KeRolm Baptise.D.   On: 08/15/2022 01:14   DG Chest Port 1 View  Result Date: 08/14/2022 CLINICAL DATA:  Questionable sepsis - evaluate for abnormality EXAM: PORTABLE CHEST 1 VIEW COMPARISON:  Radiograph 12/03/2021 FINDINGS: Lung volumes are low. Cardiomegaly, stable from prior. Left hemidiaphragm is slightly indistinct, this may be due to airspace disease, atelectasis or small pleural effusion. Bronchovascular crowding versus vascular congestion. No pneumothorax. IMPRESSION: 1. Low lung volumes with cardiomegaly. 2. Indistinct left hemidiaphragm may be due to airspace disease, atelectasis or small pleural effusion. 3. Vascular congestion versus bronchovascular crowding. Electronically Signed   By: MeKeith Rake.D.   On: 08/14/2022 21:17   CT HEAD WO CONTRAST  Result Date: 08/14/2022 CLINICAL DATA:  Seizure  EXAM: CT HEAD WITHOUT CONTRAST TECHNIQUE: Contiguous axial images were obtained from the base of the skull through the vertex without intravenous contrast. RADIATION DOSE REDUCTION: This exam was performed according to the departmental dose-optimization program which includes automated exposure control, adjustment of the mA and/or kV according to patient size and/or use of iterative reconstruction technique. COMPARISON:  CT 12/03/2021, MRI 12/03/2021 FINDINGS: Brain: No acute territorial infarction, hemorrhage or intracranial mass. Extensive white matter hypodensity consistent with chronic small vessel ischemic change. Chronic right MCA infarct with extensive encephalomalacia in the right frontal, parietal, and temporal lobes with ex vacuo dilatation of the ventricle. Chronic infarct left cerebellum. Probable chronic lacunar infarcts in the pons. Chronic lacunar infarcts in the left basal ganglia. Stable ventricle size. Metallic coils in the right temporal lobe. Vascular: No  hyperdense vessels. Vertebral and carotid vascular calcification Skull: Normal. Negative for fracture or focal lesion. Sinuses/Orbits: No acute finding. Other: None IMPRESSION: 1. No CT evidence for acute intracranial abnormality. 2. Multifocal chronic infarcts. Chronic small vessel ischemic changes of the white matter. Electronically Signed   By: Donavan Foil M.D.   On: 08/14/2022 20:52    Assessment/Plan:   Principal Problem:   Seizure disorder [facility])   Patient Summary: Maygan Koeller is a 46 y.o. 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  Description of seizure has some discrepancies with handoff from EMS mentioning right-sided convulsions and handout sheet from provider at SNF mentioning bilateral convulsions, so uncertain if focal or general myoclonic seizure at this point.  Patient was unable to recall what happened and was postictal on admission with acute confusion and slurred speech with questionable weakness due to chronic left-sided deficits.  She did not show any seizure activity during her admission and this was confirmed by EEG.  MRI was negative for any acute pathology and showed stable sequelae of prior infarcts.  This is appears to be most likely a myoclonic seizure due to the effects of of prior strokes.  Little to no supporting evidence for metabolic, infectious, or medication cause.  Neurology was consulted and started Keppra 500 mg twice daily after a loading dose.  Otherwise we continued her home medications including baclofen, gabapentin, lactulose, Ativan.  After speaking with her brother who is POA she was deemed not to be at baseline she was still drowsy and not  eating much which is abnormal for her.  She was kept overnight for further monitoring.   #HTN #HFpEF Last echo on 05/16/2021 revealing EF 55 to 60% and grade 2 diastolic dysfunction.  Patient was euvolemic and was not having any dyspnea or increased oxygen requirements.  Her blood pressures were labile however she had not had her medications and was originally unable to take medications by mouth.  This improved after she was able to get her medications.     #SVT Remains in normal sinus rhythm.  Restarted her amiodarone.   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  Low concern for DVT/PE at this time.   #IDA: Continued iron supplements when able to tolerate p.o.   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  Patient currently prescribed Lexapro and mirtazapine as well as gabapentin nightly.  Continued when able to take p.o.  Diet: Normal IVF: None,None VTE:  IVC Code: Full PT/OT recs:  Long term care . Family Update: Spoke to brother, POA, in the room and updated him. Please call him twice in a row if needed overnight.    Dispo: Anticipated discharge to  [facility]  in 1 days pending further improvement.   [facility] Resident PGY-1 Pager: [phone]  Please contact the on call pager after 5 pm and on weekends at [phone].

## 2022-08-15 NOTE — Procedures (Signed)
[name]  MRN: [id number]  Epilepsy Attending: Lora Havens  Referring Physician/Provider: Lorenza Chick, MD  Date: 08/15/2022 Duration: 21.15 mins  Patient history: 46 y.o. 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 EEG to evaluate for seizure  Level of alertness: Awake  AEDs during EEG study: LEV  Technical aspects: This EEG study was done with scalp electrodes positioned according to the 10-20 International system of electrode placement. Electrical activity was reviewed with band pass filter of 1-'70Hz'$ , sensitivity of 7 uV/mm, display speed of 10m/sec with a '60Hz'$  notched filter applied as appropriate. EEG data were recorded continuously and digitally stored.  Video monitoring was available and reviewed as appropriate.  Description: The posterior dominant rhythm consists of 8-9 Hz activity of moderate voltage (25-35 uV) seen predominantly in posterior head regions, symmetric and reactive to eye opening and eye closing. Hyperventilation and photic stimulation were not performed.     Of note, study was technically difficult due to significant electrode and movement artifact.  IMPRESSION: This technically difficult study is within normal limits. No seizures or epileptiform discharges were seen throughout the recording.  A normal interictal EEG does not exclude nor support the diagnosis of epilepsy. If suspicion for interictal activity remains a concern, a prolonged study can be considered.   [name]

## 2022-08-15 NOTE — ED Notes (Signed)
Pt calling out. Pt requested to be turned and pillow under legs. Pt positioned comfortably. Call bell in reach. Pt on continuous monitoring. Pt requesting to go home. Providers aware.

## 2022-08-15 NOTE — Plan of Care (Signed)
I was asked to follow-up on the EEG by the outgoing neurologist. EEG completed-no seizures. Plan as in Dr. Lyn Records note from last night. Inpatient neurology will be available as needed-please call with questions.   -- Amie Portland, MD Neurologist [facility] Pager: [phone]

## 2022-08-15 NOTE — ED Notes (Signed)
MD updated family and RN at bedside that plan of care will continue for patient to stay the night. Floor Rns and floor charge RN updated on plan of care.

## 2022-08-15 NOTE — ED Notes (Addendum)
ED TO INPATIENT HANDOFF REPORT  ED Nurse Name and Phone #: Andi Hence, RN   S Name/Age/Gender Holly Bradley 46 y.o. female Room/Bed: 042C/042C  Code Status   Code Status: Full Code  Home/SNF/Other Skilled nursing facility Patient oriented to: A&OX4. Is this baseline? Yes   Triage Complete: Triage complete  Chief Complaint Seizure disorder [facility]) [K59.935]  Triage Note Per ems patient was having right sided "jerking" per [employer]. Patient was seen having right sided gaze and nystagmus. Patient is normally paralyzed on the left side and bed bound from previous stroke. EMS provided 73m of versed IM. Patient is warm to the touch. EMS reports hypotension.    Allergies No Known Allergies  Level of Care/Admitting Diagnosis ED Disposition     ED Disposition  Admit   Condition  --   Comment  Hospital Area: [facility][[id number]]  Level of Care: Telemetry Medical [[id number]]  [facility] equivalent level of care is available:: Yes  Covid Evaluation: Asymptomatic - no recent exposure (last 10 days) testing not required  Diagnosis: Seizure disorder ([facility] [[[id number]] Admitting Physician: HLucious Groves[[id number]]  Attending Physician: HLucious Groves[[[id number]] Certification:: I certify this patient will need inpatient services for at least 2 midnights  Estimated Length of Stay: 2          B Medical/Surgery History Past Medical History:  Diagnosis Date   CH[place] (congestive heart failure) [place]    Chronic blood loss anemia    Related to uterine fibroids   Essential hypertension    Poorly controlled, repeat by report only on nicardipine 90 mg   Fibroid    Likely complicated by by menometrorrhagia and chronic anemia   Hypertension    Paroxysmal SVT (supraventricular tachycardia) (HCC)    Frequent episodes, can last anywhere from 20 minutes to 12-15 hours.  Not on beta-blocker or calcium channel blocker   Uterine  fibroid    Past Surgical History:  Procedure Laterality Date   IR ANGIOGRAM FOLLOW UP STUDY  05/15/2021   IR AORTAGRAM ABDOMINAL SERIALOGRAM  10/16/2021   IR CT HEAD LTD  05/15/2021   IR EMBO ARTERIAL NOT HEMORR HEMANG INC GUIDE ROADMAPPING  10/16/2021   IR PERCUTANEOUS ART THROMBECTOMY/INFUSION INTRACRANIAL INC DIAG ANGIO  05/15/2021       IR PERCUTANEOUS ART THROMBECTOMY/INFUSION INTRACRANIAL INC DIAG ANGIO  05/15/2021   IR TRANSCATH/EMBOLIZ  05/15/2021   IR [place] VASC ACCESS RIGHT  10/16/2021   IVC FILTER INSERTION N/A 10/24/2021   Procedure: IVC FILTER INSERTION;  Surgeon: BSerafina Mitchell MD;  [facility];  Service: Cardiovascular;  Laterality: N/A;   RADIOLOGY WITH ANESTHESIA N/A 05/15/2021   Procedure: IR WITH ANESTHESIA;  Surgeon: Radiologist, Medication, MD;  [place] [facility]  Service: Radiology;  Laterality: N/A;     A IV Location/Drains/Wounds Patient Lines/Drains/Airways Status     Active Line/Drains/Airways     Name Placement date Placement time Site Days   Peripheral IV 08/15/22 22 G 1.75" Anterior;Right Forearm 08/15/22  1257  Forearm  less than 1   External Urinary Catheter 08/15/22  0544  --  less than 1   Pressure Injury 10/14/21 Buttocks Left Stage 2 -  Partial thickness loss of dermis presenting as a shallow open injury with a red, pink wound bed without slough. 10/14/21  1430  -- 305   Pressure Injury 11/22/21 Heel Left Unstageable - Full thickness tissue loss  in which the base of the injury is covered by slough (yellow, tan, gray, green or brown) and/or eschar (tan, brown or black) in the wound bed. black eshar on heel 11/22/21  2000  -- 266            Intake/Output Last 24 hours  Intake/Output Summary (Last 24 hours) at 08/15/2022 1948 Last data filed at 08/15/2022 1509 Gross per 24 hour  Intake 216.19 ml  Output 1550 ml  Net -1333.81 ml    Labs/Imaging Results for orders placed or performed during the hospital encounter of  08/14/22 (from the past 48 hour(s))  CBC with Differential/Platelet     Status: Abnormal   Collection Time: 08/14/22  8:10 PM  Result Value Ref Range   WBC 5.6 4.0 - 10.5 K/uL   RBC 3.91 3.87 - 5.11 MIL/uL   Hemoglobin 12.3 12.0 - 15.0 g/dL   HCT 37.5 36.0 - 46.0 %   MCV 95.9 80.0 - 100.0 fL   MCH 31.5 26.0 - 34.0 pg   MCHC 32.8 30.0 - 36.0 g/dL   RDW 15.8 (H) 11.5 - 15.5 %   Platelets 254 150 - 400 K/uL   nRBC 0.0 0.0 - 0.2 %   Neutrophils Relative % 70 %   Neutro Abs 3.9 1.7 - 7.7 K/uL   Lymphocytes Relative 20 %   Lymphs Abs 1.1 0.7 - 4.0 K/uL   Monocytes Relative 8 %   Monocytes Absolute 0.5 0.1 - 1.0 K/uL   Eosinophils Relative 2 %   Eosinophils Absolute 0.1 0.0 - 0.5 K/uL   Basophils Relative 0 %   Basophils Absolute 0.0 0.0 - 0.1 K/uL   Immature Granulocytes 0 %   Abs Immature Granulocytes 0.02 0.00 - 0.07 K/uL    Comment: Performed at [facility], [address], [place], [place] [address]  Resp Panel by RT-PCR (Flu A&B, Covid) Anterior Nasal Swab     Status: None   Collection Time: 08/14/22  8:10 PM   Specimen: Anterior Nasal Swab  Result Value Ref Range   SARS Coronavirus 2 by RT PCR NEGATIVE NEGATIVE    Comment: (NOTE) SARS-CoV-2 target nucleic acids are NOT DETECTED.  The SARS-CoV-2 RNA is generally detectable in upper respiratory specimens during the acute phase of infection. The lowest concentration of SARS-CoV-2 viral copies this assay can detect is 138 copies/mL. A negative result does not preclude SARS-Cov-2 infection and should not be used as the sole basis for treatment or other patient management decisions. A negative result may occur with  improper specimen collection/handling, submission of specimen other than nasopharyngeal swab, presence of viral mutation(s) within the areas targeted by this assay, and inadequate number of viral copies(<138 copies/mL). A negative result must be combined with clinical observations, patient history, and  epidemiological information. The expected result is Negative.  Fact Sheet for Patients:  EntrepreneurPulse.com.au  Fact Sheet for Healthcare Providers:  IncredibleEmployment.be  This test is no t yet approved or cleared by the [place] FDA and  has been authorized for detection and/or diagnosis of SARS-CoV-2 by FDA under an Emergency Use Authorization (EUA). This EUA will remain  in effect (meaning this test can be used) for the duration of the COVID-19 declaration under Section 564(b)(1) of the Act, 21 U.S.C.section 360bbb-3(b)(1), unless the authorization is terminated  or revoked sooner.       Influenza A by PCR NEGATIVE NEGATIVE   Influenza B by PCR NEGATIVE NEGATIVE    Comment: (NOTE) The Xpert  Xpress SARS-CoV-2/FLU/RSV plus assay is intended as an aid in the diagnosis of influenza from Nasopharyngeal swab specimens and should not be used as a sole basis for treatment. Nasal washings and aspirates are unacceptable for Xpert Xpress SARS-CoV-2/FLU/RSV testing.  Fact Sheet for Patients: EntrepreneurPulse.com.au  Fact Sheet for Healthcare Providers: IncredibleEmployment.be  This test is not yet approved or cleared by the [place] FDA and has been authorized for detection and/or diagnosis of SARS-CoV-2 by FDA under an Emergency Use Authorization (EUA). This EUA will remain in effect (meaning this test can be used) for the duration of the COVID-19 declaration under Section 564(b)(1) of the Act, 21 U.S.C. section 360bbb-3(b)(1), unless the authorization is terminated or revoked.  Performed at [facility], [place] [address] [place], [place] [address]   Ethanol     Status: None   Collection Time: 08/14/22  8:10 PM  Result Value Ref Range   Alcohol, Ethyl (B) <10 <10 mg/dL    Comment: (NOTE) Lowest detectable limit for serum alcohol is 10 mg/dL.  For medical purposes only. Performed at  [facility], [place] [address], [place], [place] [address]   I-Stat arterial blood gas, ED     Status: Abnormal   Collection Time: 08/14/22  8:14 PM  Result Value Ref Range   pH, Arterial 7.406 7.35 - 7.45   pCO2 arterial 45.6 32 - 48 mmHg   pO2, Arterial 111 (H) 83 - 108 mmHg   Bicarbonate 28.7 (H) 20.0 - 28.0 mmol/L   TCO2 30 22 - 32 mmol/L   O2 Saturation 98 %   Acid-Base Excess 3.0 (H) 0.0 - 2.0 mmol/L   Sodium 145 135 - 145 mmol/L   Potassium 3.4 (L) 3.5 - 5.1 mmol/L   Calcium, Ion 1.38 1.15 - 1.40 mmol/L   HCT 34.0 (L) 36.0 - 46.0 %   Hemoglobin 11.6 (L) 12.0 - 15.0 g/dL   Collection site RADIAL, ALLEN'S TEST ACCEPTABLE    Drawn by RT    Sample type ARTERIAL   Blood Culture (routine x 2)     Status: None (Preliminary result)   Collection Time: 08/14/22  8:47 PM   Specimen: BLOOD LEFT HAND  Result Value Ref Range   Specimen Description BLOOD LEFT HAND    Special Requests      BOTTLES DRAWN AEROBIC AND ANAEROBIC Blood Culture results may not be optimal due to an inadequate volume of blood received in culture bottles   Culture      NO GROWTH < 12 HOURS Performed at [facility], [address], [place], [place] [address]    Report Status PENDING   Lactic acid, plasma     Status: None   Collection Time: 08/14/22 10:05 PM  Result Value Ref Range   Lactic Acid, Venous 1.3 0.5 - 1.9 mmol/L    Comment: Performed at [facility], [place] [address] [place], [place] [address]  Protime-INR     Status: None   Collection Time: 08/14/22 10:06 PM  Result Value Ref Range   Prothrombin Time 14.0 11.4 - 15.2 seconds   INR 1.1 0.8 - 1.2    Comment: (NOTE) INR goal varies based on device and disease states. Performed at [facility], [place] [address], [place], [place] [address]   Blood Culture (routine x 2)     Status: None (Preliminary result)   Collection Time: 08/14/22 10:06 PM   Specimen: BLOOD  Result Value Ref Range  Specimen Description BLOOD THUMB     Special Requests      BOTTLES DRAWN AEROBIC ONLY Blood Culture results may not be optimal due to an inadequate volume of blood received in culture bottles   Culture      NO GROWTH < 12 HOURS Performed at [facility] [address], [place], [place] [address]    Report Status PENDING   Comprehensive metabolic panel     Status: Abnormal   Collection Time: 08/15/22  4:55 AM  Result Value Ref Range   Sodium 146 (H) 135 - 145 mmol/L   Potassium 3.6 3.5 - 5.1 mmol/L   Chloride 109 98 - 111 mmol/L   CO2 25 22 - 32 mmol/L   Glucose, Bld 90 70 - 99 mg/dL    Comment: Glucose reference range applies only to samples taken after fasting for at least 8 hours.   BUN 51 (H) 6 - 20 mg/dL   Creatinine, Ser 2.04 (H) 0.44 - 1.00 mg/dL   Calcium 11.3 (H) 8.9 - 10.3 mg/dL   Total Protein 8.4 (H) 6.5 - 8.1 g/dL   Albumin 4.4 3.5 - 5.0 g/dL   AST 20 15 - 41 U/L   ALT 26 0 - 44 U/L   Alkaline Phosphatase 88 38 - 126 U/L   Total Bilirubin 0.6 0.3 - 1.2 mg/dL   GFR, Estimated 30 (L) >60 mL/min    Comment: (NOTE) Calculated using the CKD-EPI Creatinine Equation (2021)    Anion gap 12 5 - 15    Comment: Performed at [facility], [place] [address], [place], [place] [address]  Magnesium     Status: None   Collection Time: 08/15/22  4:55 AM  Result Value Ref Range   Magnesium 2.2 1.7 - 2.4 mg/dL    Comment: Performed at [facility] [address], [place], [place] [address]  CK     Status: None   Collection Time: 08/15/22  4:55 AM  Result Value Ref Range   Total CK 126 38 - 234 U/L    Comment: Performed at [facility], [place] [address] [place] [address]  CBC     Status: None   Collection Time: 08/15/22  4:57 AM  Result Value Ref Range   WBC 7.2 4.0 - 10.5 K/uL   RBC 4.02 3.87 - 5.11 MIL/uL   Hemoglobin 12.6 12.0 - 15.0 g/dL   HCT 38.7 36.0 - 46.0 %   MCV 96.3 80.0 - 100.0 fL   MCH 31.3 26.0 - 34.0 pg   MCHC 32.6 30.0 - 36.0 g/dL   RDW 14.4 11.5 - 15.5 %    Platelets 190 150 - 400 K/uL   nRBC 0.0 0.0 - 0.2 %    Comment: Performed at [facility], [place] [address], [place], [place] [address]  CBG monitoring, ED     Status: Abnormal   Collection Time: 08/15/22  8:05 AM  Result Value Ref Range   Glucose-Capillary 112 (H) 70 - 99 mg/dL    Comment: Glucose reference range applies only to samples taken after fasting for at least 8 hours.  Urine rapid drug screen (hosp performed)     Status: Abnormal   Collection Time: 08/15/22  9:50 AM  Result Value Ref Range   Opiates NONE DETECTED NONE DETECTED   Cocaine NONE DETECTED NONE DETECTED   Benzodiazepines POSITIVE (A) NONE DETECTED   Amphetamines NONE DETECTED NONE DETECTED   Tetrahydrocannabinol  NONE DETECTED NONE DETECTED   Barbiturates NONE DETECTED NONE DETECTED    Comment: (NOTE) DRUG SCREEN FOR MEDICAL PURPOSES ONLY.  IF CONFIRMATION IS NEEDED FOR ANY PURPOSE, NOTIFY LAB WITHIN 5 DAYS.  LOWEST DETECTABLE LIMITS FOR URINE DRUG SCREEN Drug Class                     Cutoff (ng/mL) Amphetamine and metabolites    1000 Barbiturate and metabolites    200 Benzodiazepine                 045 Tricyclics and metabolites     300 Opiates and metabolites        300 Cocaine and metabolites        300 THC                            50 Performed at [facility], [place] [address] [place], [place] [address]   Urinalysis, Routine w reflex microscopic Urine, Clean Catch     Status: Abnormal   Collection Time: 08/15/22  9:50 AM  Result Value Ref Range   Color, Urine STRAW (A) YELLOW   APPearance CLEAR CLEAR   Specific Gravity, Urine 1.009 1.005 - 1.030   pH 6.0 5.0 - 8.0   Glucose, UA NEGATIVE NEGATIVE mg/dL   Hgb urine dipstick NEGATIVE NEGATIVE   Bilirubin Urine NEGATIVE NEGATIVE   Ketones, ur NEGATIVE NEGATIVE mg/dL   Protein, ur NEGATIVE NEGATIVE mg/dL   Nitrite NEGATIVE NEGATIVE   Leukocytes,Ua NEGATIVE NEGATIVE    Comment: Performed at [facility] [address],  [place], [place] [address]   EEG adult  Result Date: 08/15/2022 Lora Havens, MD     08/15/2022  8:57 AM Patient Name: Orchid Glassberg MRN: [id number] Epilepsy Attending: Lora Havens Referring Physician/Provider: Lorenza Chick, MD Date: 08/15/2022 Duration: 21.15 mins Patient history: 46 y.o. 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 EEG to evaluate for seizure Level of alertness: Awake AEDs during EEG study: LEV Technical aspects: This EEG study was done with scalp electrodes positioned according to the 10-20 International system of electrode placement. Electrical activity was reviewed with band pass filter of 1-_0 , sensitivity of 7 uV/mm, display speed of 42m/sec with a _1  notched filter applied as appropriate. EEG data were recorded continuously and digitally stored.  Video monitoring was available and reviewed as appropriate. Description: The posterior dominant rhythm consists of 8-9 Hz activity of moderate voltage (25-35 uV) seen predominantly in posterior head regions, symmetric and reactive to eye opening and eye closing. Hyperventilation and photic stimulation were not performed.   Of note, study was technically difficult due to significant electrode and movement artifact. IMPRESSION: This technically difficult study is within normal limits. No seizures or epileptiform discharges were seen throughout the recording. A normal interictal EEG does not exclude nor support the diagnosis of epilepsy. If suspicion for interictal activity remains a concern, a prolonged study can be considered. PLora Havens  MR BRAIN W WO CONTRAST  Result Date: 08/15/2022 CLINICAL DATA:  New onset seizure EXAM: MRI HEAD WITHOUT AND WITH CONTRAST TECHNIQUE: Multiplanar, multiecho pulse sequences of the brain and surrounding structures were obtained without and with intravenous contrast.  CONTRAST:  6.456mGADAVIST GADOBUTROL 1 MMOL/ML IV SOLN COMPARISON:  12/03/2021 FINDINGS: Brain: No acute infarct, mass effect or  extra-axial collection. Hemosiderin deposition over the right hemisphere. No acute fracture. Large area of encephalomalacia in the right MCA territory. Moderate bilateral white matter hyperintense T2-weighted signal. Moderate generalized atrophy. There is an old left cerebellar infarct. The midline structures are normal. There is no abnormal contrast enhancement. Vascular: Major flow voids are preserved. Skull and upper cervical spine: Normal calvarium and skull base. Visualized upper cervical spine and soft tissues are normal. Sinuses/Orbits:No paranasal sinus fluid levels or advanced mucosal thickening. No mastoid or middle ear effusion. Normal orbits. IMPRESSION: 1. No acute intracranial abnormality. 2. Large area of encephalomalacia in the right MCA territory. 3. Moderate generalized atrophy and chronic small vessel disease. Electronically Signed   By: Ulyses Jarred M.D.   On: 08/15/2022 02:34   DG Abd Portable 1 View  Result Date: 08/15/2022 CLINICAL DATA:  Clearance for MRI EXAM: PORTABLE ABDOMEN - 1 VIEW COMPARISON:  12/03/2021 FINDINGS: Retrievable Celect IVC filter. No additional radiopaque foreign bodies. Nonobstructive bowel gas pattern. No organomegaly or free air. IMPRESSION: Retrievable Celect IVC filter. No acute findings. Electronically Signed   By: Rolm Baptise M.D.   On: 08/15/2022 01:14   DG Chest Port 1 View  Result Date: 08/14/2022 CLINICAL DATA:  Questionable sepsis - evaluate for abnormality EXAM: PORTABLE CHEST 1 VIEW COMPARISON:  Radiograph 12/03/2021 FINDINGS: Lung volumes are low. Cardiomegaly, stable from prior. Left hemidiaphragm is slightly indistinct, this may be due to airspace disease, atelectasis or small pleural effusion. Bronchovascular crowding versus vascular congestion. No pneumothorax. IMPRESSION: 1. Low lung volumes with cardiomegaly. 2.  Indistinct left hemidiaphragm may be due to airspace disease, atelectasis or small pleural effusion. 3. Vascular congestion versus bronchovascular crowding. Electronically Signed   By: Keith Rake M.D.   On: 08/14/2022 21:17   CT HEAD WO CONTRAST  Result Date: 08/14/2022 CLINICAL DATA:  Seizure EXAM: CT HEAD WITHOUT CONTRAST TECHNIQUE: Contiguous axial images were obtained from the base of the skull through the vertex without intravenous contrast. RADIATION DOSE REDUCTION: This exam was performed according to the departmental dose-optimization program which includes automated exposure control, adjustment of the mA and/or kV according to patient size and/or use of iterative reconstruction technique. COMPARISON:  CT 12/03/2021, MRI 12/03/2021 FINDINGS: Brain: No acute territorial infarction, hemorrhage or intracranial mass. Extensive white matter hypodensity consistent with chronic small vessel ischemic change. Chronic right MCA infarct with extensive encephalomalacia in the right frontal, parietal, and temporal lobes with ex vacuo dilatation of the ventricle. Chronic infarct left cerebellum. Probable chronic lacunar infarcts in the pons. Chronic lacunar infarcts in the left basal ganglia. Stable ventricle size. Metallic coils in the right temporal lobe. Vascular: No hyperdense vessels. Vertebral and carotid vascular calcification Skull: Normal. Negative for fracture or focal lesion. Sinuses/Orbits: No acute finding. Other: None IMPRESSION: 1. No CT evidence for acute intracranial abnormality. 2. Multifocal chronic infarcts. Chronic small vessel ischemic changes of the white matter. Electronically Signed   By: Donavan Foil M.D.   On: 08/14/2022 20:52    Pending Labs Unresulted Labs (From admission, onward)    None       Vitals/Pain Today's Vitals   08/15/22 1527 08/15/22 1530 08/15/22 1630 08/15/22 1730  BP: (!) 168/107 (!) 192/101 104/61 124/68  Pulse: 70 70 78 75  Resp: _0 Temp:  98.2 F (36.8 C)     TempSrc: Oral     SpO2: 100% 100% 98% 98%  PainSc: 0-No pain       Isolation Precautions No active isolations  Medications Medications  levETIRAcetam (KEPPRA) IVPB 500 mg/100 mL premix ( Intravenous Restarted 08/15/22 0935)  chlorhexidine gluconate (MEDLINE KIT) (PERIDEX) 0.12 % solution 15 mL (15 mLs Mouth Rinse Not Given 08/15/22 0936)  Oral care mouth rinse (15 mLs Mouth Rinse Given 08/15/22 1748)  dextrose 5 % solution (0 mLs Intravenous Stopped 08/15/22 1000)  hydrALAZINE (APRESOLINE) injection 20 mg (has no administration in time range)  amiodarone (PACERONE) tablet 200 mg (200 mg Oral Given 08/15/22 1409)  aspirin chewable tablet 81 mg (81 mg Oral Given 08/15/22 1409)  atorvastatin (LIPITOR) tablet 40 mg (40 mg Oral Given 08/15/22 1409)  baclofen (LIORESAL) tablet 20 mg (20 mg Oral Given 08/15/22 1748)  bisacodyl (DULCOLAX) suppository 10 mg (has no administration in time range)  carvedilol (COREG) tablet 25 mg (25 mg Oral Given 08/15/22 1748)  cloNIDine (CATAPRES) tablet 0.1 mg (0.1 mg Oral Given 08/15/22 1520)  escitalopram (LEXAPRO) tablet 20 mg (20 mg Oral Given 08/15/22 1409)  feeding supplement (ENSURE ENLIVE / ENSURE PLUS) liquid 237 mL (237 mLs Oral Given 08/15/22 1441)  ferrous sulfate tablet 325 mg (325 mg Oral Given 08/15/22 1748)  gabapentin (NEURONTIN) capsule 300 mg (has no administration in time range)  hydrALAZINE (APRESOLINE) tablet 100 mg (100 mg Oral Given 08/15/22 1520)  HYDROmorphone HCl (DILAUDID) liquid 1 mg (has no administration in time range)  lactulose (CHRONULAC) 10 GM/15ML solution 10 g (10 g Oral Given 08/15/22 1407)  linaclotide (LINZESS) capsule 145 mcg (has no administration in time range)  LORazepam (ATIVAN) tablet 0.5 mg (0.5 mg Oral Given 08/15/22 1520)  melatonin tablet 6 mg (has no administration in time range)  Muscle Rub CREA 1 Application (has no administration in time range)  mirtazapine (REMERON) tablet 7.5 mg (has no  administration in time range)  multivitamin with minerals tablet 1 tablet (1 tablet Oral Given 08/15/22 1410)  ondansetron (ZOFRAN) tablet 4 mg (has no administration in time range)  polyethylene glycol (MIRALAX / GLYCOLAX) packet 17 g (17 g Oral Given 08/15/22 1405)  sacubitril-valsartan (ENTRESTO) 49-51 mg per tablet (1 tablet Oral Given 08/15/22 1409)  senna-docusate (Senokot-S) tablet 2 tablet (2 tablets Oral Given 08/15/22 1408)  simethicone (MYLICON) 40 QJ/1.9ER suspension 40 mg (has no administration in time range)  torsemide (DEMADEX) tablet 20 mg (20 mg Oral Given 08/15/22 1408)  feeding supplement (PRO-STAT SUGAR FREE 64) liquid 30 mL (has no administration in time range)  acetaminophen (TYLENOL) tablet 1,000 mg (1,000 mg Oral Given 08/15/22 1520)  pantoprazole (PROTONIX) 2 mg/mL oral suspension 40 mg (40 mg Oral Not Given 08/15/22 1430)  isosorbide mononitrate (ISMO) tablet 30 mg (30 mg Oral Not Given 08/15/22 1430)  lactated ringers bolus 1,000 mL (0 mLs Intravenous Stopped 08/15/22 0042)  levETIRAcetam (KEPPRA) IVPB 1000 mg/100 mL premix (0 mg Intravenous Stopped 08/15/22 0041)  gadobutrol (GADAVIST) 1 MMOL/ML injection 6.4 mL (6.4 mLs Intravenous Contrast Given 08/15/22 0222)  furosemide (LASIX) injection 20 mg (20 mg Intravenous Given 08/15/22 0541)    Mobility non-ambulatory High fall risk   Focused Assessments Cardiac Assessment Handoff:  Cardiac Rhythm: Normal sinus rhythm Lab Results  Component Value Date   CKTOTAL 126 08/15/2022   Lab Results  Component Value Date   DDIMER 7.55 (H) 10/10/2021   Does the Patient currently have chest pain? No   , Neuro Assessment Handoff:  Swallow screen pass? No . Diet ordered thin liquids. Crushed pills. Cardiac Rhythm: Normal sinus rhythm NIH Stroke Scale ( + Modified Stroke Scale Criteria)  LOC Questions (1b. )   +:  Answers one question correctly LOC Commands (1c. )   + : Performs one task correctly Best Gaze (2. )  +:  Normal Visual (3. )  +: No visual loss     Neuro Assessment: Exceptions to WDL Neuro Checks:      Last Documented NIHSS Modified Score:   Has TPA been given? No If patient is a Neuro Trauma and patient is going to OR before floor call report to [place] nurse: [phone] or [phone]  , Pulmonary Assessment Handoff:  Lung sounds:   O2 Device: Room Air      R Recommendations: See Admitting Provider Note  Report given to:   Additional Notes: Pt diet is thin liquid with crushed pills in apple sauce. Hx of L sided paralysis s/p CVA hx

## 2022-08-15 NOTE — ED Notes (Signed)
Pt calling out at this time. Pt requesting pillow to be adjusted.Pt placed in position in most comfort. Call bell in reach. Side rails up.

## 2022-08-15 NOTE — ED Notes (Signed)
Patient transported to MRI 

## 2022-08-15 NOTE — ED Notes (Signed)
Requested IV access for pt via IV team. PT [place] IV infiltrated.

## 2022-08-15 NOTE — ED Notes (Signed)
Pt noted to have tremor/shaking on right side. Pt alert and able to move and hold conversation and answer questions. Johny Blamer DO aware.

## 2022-08-15 NOTE — ED Notes (Signed)
Hospitalist at bedside at this time 

## 2022-08-15 NOTE — ED Notes (Signed)
Spoke to [employer] pt POA at this time. Update given

## 2022-08-15 NOTE — ED Notes (Signed)
EEG tech at bedside. 

## 2022-08-15 NOTE — Progress Notes (Signed)
EEG complete - results pending 

## 2022-08-15 NOTE — Progress Notes (Signed)
OT Cancellation Note  Patient Details Name: Holly Bradley MRN: [id number] DOB: Sep 15, 1976   Cancelled Treatment:    Reason Eval/Treat Not Completed: Other (comment) Pt is Medicaid and current D/C plan is to return to SNF where she is a long term resident and total A for ADL tasks with the exception of being able to feed herself.  No apparent immediate acute care OT needs, therefore will defer OT to SNF. If OT eval is needed please call Acute Rehab Dept. at [phone]. Thank you.   Chris Narasimhan,[name] 08/15/2022, 2:23 PM Maurie Boettcher, OT/L   Acute OT Clinical Specialist Acute Rehabilitation Services Pager [phone] Office [phone]

## 2022-08-15 NOTE — ED Provider Notes (Signed)
Blood pressure (!) 144/73, pulse (!) 58, temperature 98.3 F (36.8 C), temperature source Rectal, resp. rate 12, SpO2 100 %.  Assuming care from Dr. Doren Custard.  In short, Holly Bradley is a 46 y.o. female with a chief complaint of Seizures .  Refer to the original H&P for additional details.  The current plan of care is to follow up on UA and Neurology for final disposition.  Discussed case with Dr. Curly Shores. Plan for MRI and EEG. See consultation note for further detail.   Discussed patient's case with IM teaching service  to request admission. Patient and family (if present) updated with plan.   EKG Interpretation  Date/Time:  Wednesday August 14 2022 20:02:05 EDT Ventricular Rate:  62 PR Interval:  198 QRS Duration: 110 QT Interval:  487 QTC Calculation: 495 R Axis:   124 Text Interpretation: Sinus rhythm Confirmed by Godfrey Pick [phone]) on 08/14/2022 8:11:37 PM       I reviewed all nursing notes, vitals, pertinent old records, EKGs, labs, imaging (as available).     Margette Fast, MD 08/15/22 [phone]

## 2022-08-15 NOTE — ED Notes (Signed)
Called pt POA Holly Bradley and gave update

## 2022-08-15 NOTE — H&P (Addendum)
Date: 08/15/2022               Patient Name:  Holly Bradley MRN: [id number]  DOB: September 29, 1976 Age / Sex: 46 y.o., female   PCP: Johny Blamer, DO         Medical Service: Internal Medicine Teaching Service         Attending Physician: Dr. 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  She is currently dysarthric but able to interact and answer questions and history was obtained directly from her.  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  EMS was called to evaluate her at her facility after employees noted right-sided convulsions.  Of note, there is some discrepancy here as the provider handoff she mentions bilateral convulsions as opposed to right hemibody. On their arrival, they noted that she had jerking motions of her right upper and lower extremities and right-sided gaze deviation.  They gave 5 mg IM Versed and these motions concluded.  No other interventions were given prior to her arrival in the ED.  She has not been noted to have any further seizure-like activity in the ED. she states that she had a seizure last year, but is unable to describe this further.  She endorses some pain in her face and around her jaw and states that she is unable to open her mouth fully.  She cannot  remember anything that was different about yesterday prior to her seizure.  She denies any falls, head trauma, fevers, chest pain, palpitations, vision changes, abdominal pain, or headaches prior to her seizure or currently.  She says that she blacked out and the next thing she remembers was the beeping of the monitor.  She does endorse some mild dyspnea now.  When asked about her CODE status, she says that it has been updated since the documentation.  She does not want CPR or defibrillation, but is okay with plan of invasive ventilation and intubation.  We can give ACLS meds as well.  We will continue to discuss this with family involvement as well.  ED Course: See diagnostic work-up below and ED provider note for full assessment and plan.  No seizure-like activity was noted while she has been in the ED.  Neurology was consulted and recommended MRI and EEG.  She was given initial loading dose of Keppra and started on 500 mg twice daily as maintenance dose afterwards.  She received initial 1 L bolus. Meds:  SVT: Amiodarone 200 mg daily Prior CVA: ASA 81 mg daily, Lipitor 40 mg daily HTN/HFpEF: Coreg 25 mg twice daily, clonidine 0.1 mg 3 times daily, hydralazine 100 mg 3 times daily, Imdur 60 mg daily, Entresto 97-103 mg twice daily, torsemide 20 mg  1-2x per day IDA: Ferrous sulfate 325 mg twice daily Lexapro 20 mg daily, Remeron 7.5 mg nightly, gabapentin 300 mg nightly Symptomatic management: Feeding supplements, baclofen 20 mg 3 times daily, Dulcolax suppository/MiraLAX/lactulose 10 g twice daily/Senokot/simethicone/sodium phosphate suppository, Dilaudid 1 mg every 2 hours's for severe pain  Past Medical History  Past Surgical History:  Procedure Laterality Date   IR ANGIOGRAM FOLLOW UP STUDY  05/15/2021   IR AORTAGRAM ABDOMINAL SERIALOGRAM  10/16/2021   IR CT HEAD LTD  05/15/2021   IR EMBO ARTERIAL NOT HEMORR HEMANG INC GUIDE ROADMAPPING  10/16/2021   IR PERCUTANEOUS ART  THROMBECTOMY/INFUSION INTRACRANIAL INC DIAG ANGIO  05/15/2021       IR PERCUTANEOUS ART THROMBECTOMY/INFUSION INTRACRANIAL INC DIAG ANGIO  05/15/2021   IR TRANSCATH/EMBOLIZ  05/15/2021   IR US GUIDE VASC ACCESS RIGHT  10/16/2021   IVC FILTER INSERTION N/A 10/24/2021   Procedure: IVC FILTER INSERTION;  Surgeon: Serafina Mitchell, MD;  Location: [facility];  Service: Cardiovascular;  Laterality: N/A;   RADIOLOGY WITH ANESTHESIA N/A 05/15/2021   Procedure: IR WITH ANESTHESIA;  Surgeon: Radiologist, Medication, MD;  Location: [facility];  Service: Radiology;  Laterality: N/A;    Social:  Lives With: [facility] for past 8 months 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  Noted to be alert and oriented and conversant at baseline. PCP: [facility] Substances: Smoking history on file, unsure how many pack years.  Family History:  Family History  Problem Relation Age of Onset   Dementia Mother      Allergies: Allergies as of 08/14/2022   (No Known Allergies)    Review of Systems: A complete ROS was negative except as per HPI.   OBJECTIVE:   Physical Exam: Blood pressure (!) 152/77, pulse (!) 56, temperature 98.4 F (36.9 C), temperature source Axillary, resp. rate 18, SpO2 100 %.  Constitutional: Chronically ill-appearing female in NAD.  Attempts to respond to most questions Cardiovascular: RRR, no murmurs, rubs or gallops Pulmonary/Chest: normal work of breathing on room air, lungs clear to auscultation bilaterally Abdominal: soft, non-tender, non-distended Extremities: warm, well perfused, extremity pulses 2+, no BLE pitting edema Neuro: Alert and oriented x2 (not to place).  CNs intact with exception of possibly 5, 10, 12 as patient is unable to open her jaw wide enough for evaluation and can move her tongue but is unable open her mouth wide enough to protrude it.  1/5 strength and loss of sensation on left hemibody.   4/5 strength on right hemibody with sensation intact.  Persistent plantarflexion of right foot.  Labs:    Latest Ref Rng & Units 08/14/2022    8:14 PM 08/14/2022    8:10 PM 12/14/2021    4:10 PM 12/11/2021    1:21 AM 12/10/2021    1:42 AM 12/09/2021    7:16 PM 12/09/2021    2:11 AM  CBC EXTENDED  WBC 4.0 - 10.5 K/uL  5.6  9.5  11.9  18.1  20.6  22.0   RBC 3.87 - 5.11 MIL/uL  3.91  3.55  2.93  3.06  2.73  2.54   Hemoglobin 12.0 - 15.0 g/dL 11.6  12.3  9.5  7.9  8.2  7.4  6.6   HCT 36.0 - 46.0 % 34.0  37.5  30.1  24.6  24.9  22.5  21.2   Platelets 150 - 400 K/uL  254  600  404  347  360  370   NEUT# 1.7 - 7.7 K/uL  3.9  8.3       Lymph# 0.7 - 4.0 K/uL  1.1  0.6           Latest Ref Rng & Units 08/14/2022    8:14 PM 12/14/2021    4:10 PM 12/11/2021    1:21 AM 12/10/2021    1:42 AM 12/09/2021    2:11 AM 12/08/2021    7:11 AM 12/07/2021    1:49 AM  CMP  Glucose 70 - 99 mg/dL  94  82  96  90  97  112   BUN 6 - 20 mg/dL  17  36  44  52  61  76   Creatinine 0.44 - 1.00 mg/dL  0.69  0.92  0.98  1.18  1.17  1.37   Sodium 135 - 145 mmol/L 145  142  143  144  144  145  141   Potassium 3.5 - 5.1 mmol/L 3.4  3.0  4.1  4.6  3.4  3.5  3.9   Chloride 98 - 111 mmol/L  110  113  114  112  109  110   CO2 22 - 32 mmol/L  '22  22  23  23  24  23   '$ Calcium 8.9 - 10.3 mg/dL  9.5  10.3  10.6  10.8  11.4  10.9   Total Protein 6.5 - 8.1 g/dL  7.7  6.4  6.9  6.8   7.5   Total Bilirubin 0.3 - 1.2 mg/dL  0.6  1.0  1.2  1.1   1.0   Alkaline Phos 38 - 126 U/L  160  133  149  151   159   AST 15 - 41 U/L  '14  15  14  13   18   '$ ALT 0 - 44 U/L  '11  8  9  9   11   '$ RVP negative Ethanol negative, UDS pending UA pending Blood cultures pending Lactic acid 1.3 PT 14, INR 1.1  Imaging: CXR: Stable cardiomegaly and vascular congestion with possible left-sided atelectasis or opacity versus small pleural effusion  KUB: IVC filter in place.  No acute findings  CT head: No acute bleed, hydrocephalus, fracture,  parenchymal edema or lesions.  Sequelae of prior infarcts and chronic white matter disease.  MRI brain with and without contrast: Chronic right-sided encephalomalacia.  Generalized atrophy with stable ex vacuo hydrocephalus and chronic white matter disease.  No acute infarcts, lesions, infection.  EKG: personally reviewed my interpretation is normal sinus rhythm.   ASSESSMENT & PLAN:  Ludie Hudon is a 46 y.o. 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  Description of seizure has some discrepancies with handoff from EMS mentioning right-sided convulsions and handout sheet from provider at SNF mentioning bilateral convulsions, so uncertain if focal or general myoclonic seizure at this point.  Patient also unable to describe and feels like she lost consciousness and only regained when she was en route to the hospital.  Not actively seizing in the ED and on assessment and appears postictal with confusion, slurred speech, weakness (though uncertain what the baseline is for her left hemiparesis).  MRI was negative for  any acute pathology and showed mostly sequelae of prior infarcts and chronic white matter disease.  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  Other etiologies include metabolic.  Low suspicion for infection or medication toxicity/withdrawal at this time (though she is noted to be taking  baclofen, occasional Ativan, and gabapentin at SNF).  Neurology on board and appreciate recs.  Patient already received Keppra load in the ED. - Continue Keppra 500 mg twice daily, IV until patient can tolerate p.o. - Follow-up EEG - Follow-up CMP, mag, CK, UA, UDS, blood cultures - N.p.o.  SLP eval.  Consider maintenance D5 fluids and discuss possible cortrak option with family if patient needs to remain n.p.o. - Seizure precautions - PT/OT - Continue home ASA 81, Lipitor 40 when patient is able to tolerate p.o. consider ASA suppository until that point. - Continue baclofen 20 mg 3 times daily and symptomatic management  #HTN #HFpEF Last echo on 05/16/2021 revealing EF 55 to 60% and grade 2 diastolic dysfunction.  Patient appears euvolemic on exam today.  Blood pressures were labile in the ED and hypertensive later in the night.  Patient did receive most of her medications on 8/23.  No permissive hypertension at this point as MRI is negative for acute infarct. S/p IV lasix '20mg'$ . - Continue home antihypertensives if patient can tolerate p.o. if not, consider IV management with goal normotension - further IV diuresis if patient has worsening volume status - Consider repeat echo  #SVT Patient currently in normal sinus rhythm - Continue telemetry and continuous pulse ox  #DVT s/p IVC filter IVC filter in place.  Low concern for DVT/PE at this time.  #IDA: Continue iron supplements when able to tolerate p.o.  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  Patient currently prescribed Lexapro and mirtazapine as well as gabapentin nightly.  Uncertain what the exact indication for these meds is.  Further assess for MDD/GAD and continue as appropriate.  Diet: NPO VTE: SCDs, IVC filter in place IVF: None,None Code:  Partial: No CPR or defibrillation, but intubation and ACLS meds okay  Prior to Admission Living Arrangement: SNF, [facility] Anticipated Discharge Location:  SNF Barriers to Discharge: Clinical improvement and PT/OT/SLP eval  Dispo: Admit patient to Inpatient with expected length of stay greater than 2 midnights.  Signed: Linus Galas, MD Internal Medicine Resident PGY-1  08/15/2022, 2:18 AM

## 2022-08-15 NOTE — ED Notes (Signed)
PT in room to evaluate pt

## 2022-08-15 NOTE — ED Notes (Signed)
Pt resting on stretcher. No distress noted. Side rails up X2. Call bell in reach. Pt on continuous monitoring.

## 2022-08-15 NOTE — ED Notes (Signed)
Phlebotomy requested for morning labs.

## 2022-08-15 NOTE — Hospital Course (Addendum)
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  Description of seizure has some discrepancies with handoff from EMS mentioning right-sided convulsions and handout sheet from provider at SNF mentioning bilateral convulsions, so uncertain if focal or general myoclonic seizure at this point.  Patient was unable to recall what happened and was postictal on admission with acute confusion and slurred speech with questionable weakness due to chronic left-sided deficits.  She did not show any seizure activity during her admission and this was confirmed by EEG.  MRI was negative for any acute pathology and showed stable sequelae of prior infarcts.  Urinalysis did not show any sign of prolonged time time with concern for rhabdomyolysis.  CK of 126 and lactic of 1.3.  UDS positive for benzodiazepines, which she is prescribed.  This is appears to be most likely a myoclonic seizure due to the effects of of prior strokes.  Little to no supporting evidence for metabolic, infectious, or medication cause.  Neurology was consulted and started Keppra 500 mg twice daily after a loading dose.  Otherwise we continued her home medications including baclofen, gabapentin, lactulose, Ativan.  After speaking with her brother who is POA and confirming that she is at her baseline she was discharged back to [facility] with a prescription for Keppra 500 mg twice a day.   #HTN #HFpEF Last echo on 05/16/2021 revealing EF 55 to 60% and grade 2 diastolic dysfunction.  Patient was euvolemic and was not having any dyspnea or increased oxygen requirements.  Her blood pressures were labile however she had not had her medications and was originally unable to take medications by mouth. Blood pressures subsequently elevated and her home hydralazine and entreso 49-51 mg was restarted. Patient remained euvolemic, imdur and torsemide have not been restarted, Her  volume status and blood pressure will need to be monitored at her facility and restarted as necessary.    #SVT Patient remained in normal sinus rhythm during admission and when able to take p.o. medications we restarted her amiodarone.   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  Low concern for DVT/PE at this time.   #IDA: Continued iron supplements when able to tolerate p.o.   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  Patient currently prescribed Lexapro and mirtazapine as well as gabapentin nightly.  Continued when able to take p.o.

## 2022-08-15 NOTE — ED Notes (Signed)
Patient had purewick placed. Pillow applied to left side.

## 2022-08-15 NOTE — ED Notes (Signed)
IV team at bedside 

## 2022-08-15 NOTE — Progress Notes (Signed)
Patient is currently in MRI. RN will inform EEG when patient returns.

## 2022-08-15 NOTE — ED Notes (Signed)
Per [facility] MRI tech patient has been cleared to go to MRI and they have put in transport for her to be taken to MRI

## 2022-08-15 NOTE — ED Notes (Signed)
Messaged providers and updated that pt Son is at bedside at this time.

## 2022-08-15 NOTE — Evaluation (Signed)
Clinical/Bedside Swallow Evaluation Patient Details  Name: Holly Bradley MRN: [id number] Date of Birth: [birth year]  Today's Date: 08/15/2022 Time: SLP Start Time (ACUTE ONLY): 0930 SLP Stop Time (ACUTE ONLY): 0943 SLP Time Calculation (min) (ACUTE ONLY): 13 min  Past Medical History:  Past Medical History:  Diagnosis Date   CHF (congestive heart failure) (HCC)    Chronic blood loss anemia    Related to uterine fibroids   Essential hypertension    Poorly controlled, repeat by report only on nicardipine 90 mg   Fibroid    Likely complicated by by menometrorrhagia and chronic anemia   Hypertension    Paroxysmal SVT (supraventricular tachycardia) (HCC)    Frequent episodes, can last anywhere from 20 minutes to 12-15 hours.  Not on beta-blocker or calcium channel blocker   Uterine fibroid    Past Surgical History:  Past Surgical History:  Procedure Laterality Date   IR ANGIOGRAM FOLLOW UP STUDY  05/15/2021   IR AORTAGRAM ABDOMINAL SERIALOGRAM  10/16/2021   IR CT HEAD LTD  05/15/2021   IR EMBO ARTERIAL NOT HEMORR HEMANG INC GUIDE ROADMAPPING  10/16/2021   IR PERCUTANEOUS ART THROMBECTOMY/INFUSION INTRACRANIAL INC DIAG ANGIO  05/15/2021       IR PERCUTANEOUS ART THROMBECTOMY/INFUSION INTRACRANIAL INC DIAG ANGIO  05/15/2021   IR TRANSCATH/EMBOLIZ  05/15/2021   IR US GUIDE VASC ACCESS RIGHT  10/16/2021   IVC FILTER INSERTION N/A 10/24/2021   Procedure: IVC FILTER INSERTION;  Surgeon: Serafina Mitchell, MD;  Location: [facility];  Service: Cardiovascular;  Laterality: N/A;   RADIOLOGY WITH ANESTHESIA N/A 05/15/2021   Procedure: IR WITH ANESTHESIA;  Surgeon: Radiologist, Medication, MD;  Location: [facility];  Service: Radiology;  Laterality: N/A;   HPI:  Pt is a 46 year old female who presented to the ED following an episode of convulsions. EMS noted jerking motions of her right upper and lower extremities and right-sided gaze deviation. 5 mg IM Versed administered and these  motions concluded. Per H&P, pt reported some pain in her face and around her jaw and states that she is unable to open her mouth fully. MRI brain negative, EEG normal. 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 MBS 12/07/21: impaired bolus cohesion, impaired mastication, weak bolus manipulation, and a pharyngeal delay. Aspiration of thin liquid with delayed sensation; dysphagia 1 with nectar thick liquids recommended, pt subsequently advanced to dysphagia 2 with thin liquids on 12/12/21.    Assessment / Plan / Recommendation  Clinical Impression  Pt was seen in the ED for bedside swallow evaluation. She reported intake of regular texture solids and that meds are typically crushed and given in puree at [employer]. Oral mechanism exam was significant for left-sided facial weakness and reduced lingual strength. She presented with adequate, natural dentition. Pt exhibited reduced labial seal, mild anterior bolus loss, and a single cough after intake of ten consecutive swallows of thin liquids via cup. No s/s of aspiration were noted with reduced intake rate and up to five consecutive swallows or with solids. A regular texture diet with thin liquids is recommended at this time with observance of swallowing precautions. Pt was able to demonstrate understanding using teach back, but at least intermittent supervision is recommended to facilitate observance of precautions. SLP will follow pt to ensure tolerance. SLP Visit Diagnosis: Dysphagia, unspecified (R13.10)    Aspiration Risk  Mild aspiration risk    Diet Recommendation Regular;Thin liquid  Liquid Administration via: Cup;Straw Medication Administration: Crushed with puree Supervision: Patient able to self feed Compensations: Slow rate;Small sips/bites Postural Changes: Seated upright at 90 degrees    Other  Recommendations Oral Care Recommendations:  Oral care BID    Recommendations for follow up therapy are one component of a multi-disciplinary discharge planning process, led by the attending physician.  Recommendations may be updated based on patient status, additional functional criteria and insurance authorization.  Follow up Recommendations  (TBD)      Assistance Recommended at Discharge    Functional Status Assessment Patient has had a recent decline in their functional status and demonstrates the ability to make significant improvements in function in a reasonable and predictable amount of time.  Frequency and Duration min 1 x/week  1 week       Prognosis Prognosis for Safe Diet Advancement: Good Barriers to Reach Goals: Cognitive deficits      Swallow Study   General Date of Onset: 08/14/22 HPI: Pt is a 46 year old female who presented to the ED following an episode of convulsions. EMS noted jerking motions of her right upper and lower extremities and right-sided gaze deviation. 5 mg IM Versed administered and these motions concluded. Per H&P, pt reported some pain in her face and around her jaw and states that she is unable to open her mouth fully. MRI brain negative, EEG normal. 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 MBS 12/07/21: impaired bolus cohesion, impaired mastication, weak bolus manipulation, and a pharyngeal delay. Aspiration of thin liquid with delayed sensation; dysphagia 1 with nectar thick liquids recommended, pt subsequently advanced to dysphagia 2 with thin liquids on 12/12/21. Type of Study: Bedside Swallow Evaluation Previous Swallow Assessment: See HPI Diet Prior to this Study: NPO Temperature Spikes Noted: No Respiratory Status: Room air History of Recent Intubation: No Behavior/Cognition: Alert;Cooperative;Pleasant mood Oral Cavity Assessment: Within Functional Limits Oral Care Completed by SLP:  No Oral Cavity - Dentition: Adequate natural dentition Vision: Functional for self-feeding Self-Feeding Abilities: Able to feed self Patient Positioning: Upright in bed;Postural control adequate for testing Baseline Vocal Quality: Normal Volitional Cough: Strong Volitional Swallow: Able to elicit    Oral/Motor/Sensory Function Overall Oral Motor/Sensory Function: Mild impairment Facial ROM: Reduced left;Suspected CN VII (facial) dysfunction Facial Symmetry: Abnormal symmetry left;Suspected CN VII (facial) dysfunction Facial Strength: Reduced left;Suspected CN VII (facial) dysfunction   Ice Chips Ice chips: Within functional limits Presentation: Spoon   Thin Liquid Thin Liquid: Impaired Presentation: Cup;Straw Pharyngeal  Phase Impairments: Cough - Immediate (after intake of 10 consecutive swallows of thin liquids via cup; up to 5 consecutive swallows were subsequently tolerated without s/s)    Nectar Thick Nectar Thick Liquid: Not tested   Honey Thick Honey Thick Liquid: Not tested   Puree Puree: Within functional limits Presentation: Spoon   Solid     Solid: Impaired Presentation: Self Fed Oral Phase Impairments: Reduced labial seal Oral Phase Functional Implications: Oral residue     Alisabeth Selkirk I. Hardin Negus, [place], [place] Office number [phone]  DUSTIE BRITTLE 08/15/2022,10:19 AM

## 2022-08-16 DIAGNOSIS — F29 Unspecified psychosis not due to a substance or known physiological condition: Secondary | ICD-10-CM

## 2022-08-16 DIAGNOSIS — R569 Unspecified convulsions: Principal | ICD-10-CM

## 2022-08-16 DIAGNOSIS — R339 Retention of urine, unspecified: Secondary | ICD-10-CM

## 2022-08-16 DIAGNOSIS — D509 Iron deficiency anemia, unspecified: Secondary | ICD-10-CM

## 2022-08-16 DIAGNOSIS — I471 Supraventricular tachycardia: Secondary | ICD-10-CM

## 2022-08-16 DIAGNOSIS — I110 Hypertensive heart disease with heart failure: Secondary | ICD-10-CM

## 2022-08-16 DIAGNOSIS — F17210 Nicotine dependence, cigarettes, uncomplicated: Secondary | ICD-10-CM

## 2022-08-16 DIAGNOSIS — I5022 Chronic systolic (congestive) heart failure: Secondary | ICD-10-CM

## 2022-08-16 LAB — BASIC METABOLIC PANEL
Anion gap: 10 (ref 5–15)
BUN: 43 mg/dL — ABNORMAL HIGH (ref 6–20)
CO2: 26 mmol/L (ref 22–32)
Calcium: 10.9 mg/dL — ABNORMAL HIGH (ref 8.9–10.3)
Chloride: 108 mmol/L (ref 98–111)
Creatinine, Ser: 1.94 mg/dL — ABNORMAL HIGH (ref 0.44–1.00)
GFR, Estimated: 31 mL/min — ABNORMAL LOW (ref >60)
Glucose, Bld: 94 mg/dL (ref 70–99)
Potassium: 3.8 mmol/L (ref 3.5–5.1)
Sodium: 144 mmol/L (ref 135–145)

## 2022-08-16 LAB — GLUCOSE, CAPILLARY
Glucose-Capillary: 89 mg/dL (ref 70–99)
Glucose-Capillary: 147 mg/dL — ABNORMAL HIGH (ref 70–99)
Glucose-Capillary: 98 mg/dL (ref 70–99)

## 2022-08-16 MED ORDER — LACTATED RINGERS IV BOLUS
500.0000 mL | Freq: Once | INTRAVENOUS | Status: AC
  Administered 2022-08-16: 500 mL via INTRAVENOUS

## 2022-08-16 MED ORDER — ORAL CARE MOUTH RINSE
15.0000 mL | OROMUCOSAL | Status: DC
  Administered 2022-08-16 – 2022-08-20 (×13): 15 mL via OROMUCOSAL

## 2022-08-16 MED ORDER — ORAL CARE MOUTH RINSE: 15.0000 mL | OROMUCOSAL | Status: DC | PRN

## 2022-08-16 MED ORDER — PROSOURCE PLUS PO LIQD
30.0000 mL | Freq: Every day | ORAL | Status: DC
  Administered 2022-08-16 – 2022-08-20 (×5): 30 mL via ORAL
  Filled 2022-08-16 (×5): qty 30

## 2022-08-16 MED ORDER — MUSCLE RUB 10-15 % EX CREA
1.0000 | TOPICAL_CREAM | CUTANEOUS | Status: DC | PRN
  Administered 2022-08-16 – 2022-08-20 (×3): 1 via TOPICAL
  Filled 2022-08-16 (×2): qty 85

## 2022-08-16 MED ORDER — LEVETIRACETAM 500 MG PO TABS
500.0000 mg | ORAL_TABLET | Freq: Two times a day (BID) | ORAL | Status: DC
Start: 2022-08-16 — End: 2022-08-20
  Administered 2022-08-16 – 2022-08-20 (×9): 500 mg via ORAL
  Filled 2022-08-16 (×9): qty 1

## 2022-08-16 MED ORDER — CARVEDILOL 6.25 MG PO TABS
6.2500 mg | ORAL_TABLET | Freq: Two times a day (BID) | ORAL | Status: DC
  Administered 2022-08-16: 6.25 mg via ORAL
  Filled 2022-08-16: qty 1

## 2022-08-16 MED ORDER — WHITE PETROLATUM EX OINT
TOPICAL_OINTMENT | CUTANEOUS | Status: DC | PRN
  Filled 2022-08-16: qty 28.35

## 2022-08-16 MED ORDER — CLONIDINE HCL 0.1 MG PO TABS
0.1000 mg | ORAL_TABLET | Freq: Two times a day (BID) | ORAL | Status: DC
  Administered 2022-08-16 – 2022-08-20 (×9): 0.1 mg via ORAL
  Filled 2022-08-16 (×9): qty 1

## 2022-08-16 NOTE — TOC Initial Note (Signed)
Transition of Care [facility]) - Initial/Assessment Note    Patient Details  Name: Holly Bradley MRN: [id number] Date of Birth: 1976/02/26  [facility] CM/SW Contact:    Geralynn Ochs, LCSW Phone Number: 08/16/2022, 10:20 AM  Clinical Narrative:        CSW noting per chart review that patient is LTC at [facility], hopeful to return. CSW updated [facility] and they can accept patient back, however per MD patient now not medically stable. [facility] can accept if stable over the weekend. CSW to follow.           Expected Discharge Plan: Skilled Nursing Facility Barriers to Discharge: Continued Medical Work up   Patient Goals and CMS Choice Patient states their goals for this hospitalization and ongoing recovery are:: to get back home CMS Medicare.gov Compare Post Acute Care list provided to:: Patient Choice offered to / list presented to : Patient  Expected Discharge Plan and Services Expected Discharge Plan: [place] Choice: [place] Living arrangements for the past 2 months: [place]                                      Prior Living Arrangements/Services Living arrangements for the past 2 months: [place] Lives with:: Facility Resident Patient language and need for interpreter reviewed:: No Do you feel safe going back to the place where you live?: Yes      Need for Family Participation in Patient Care: No (Comment) Care giver support system in place?: Yes (comment)   Criminal Activity/Legal Involvement Pertinent to Current Situation/Hospitalization: No - Comment as needed  Activities of Daily Living      Permission Sought/Granted Permission sought to share information with : Facility Art therapist granted to share information with : Yes, Verbal Permission Granted     Permission granted to share info w AGENCY: SNF        Emotional  Assessment   Attitude/Demeanor/Rapport: Engaged Affect (typically observed): Appropriate Orientation: : Oriented to Self, Oriented to Place, Oriented to  Time Alcohol / Substance Use: Not Applicable Psych Involvement: No (comment)  Admission diagnosis:  Seizure disorder ([place]) [G40.909] Seizure-like activity ([place]) [R56.9] Patient Active Problem List   Diagnosis Date Noted   Seizure disorder ([place]) 08/15/2022   Hypercalcemia 12/04/2021   Acute encephalopathy 12/03/2021   Weakness 11/22/2021   Pressure injury of skin 10/13/2021   Lupus anticoagulant positive    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 ([place])    Acute blood loss anemia 10/10/2021   Hypertension    Non-ischemic cardiomyopathy (HCC)    Swelling of left hand    Proctocolitis    Constipation    Uterine fibroid    Left hemiparesis ([place])    Hemorrhagic stroke (HCC)    Abnormal uterine bleeding (AUB)    Staphylococcus epidermidis bacteremia    Iron deficiency anemia due to chronic blood loss 05/25/2021   Cerebral edema (HCC) 05/25/2021   Chronic HFrEF (heart failure with reduced ejection fraction) ([place]) 05/25/2021   Pneumonia due to methicillin susceptible Staphylococcus aureus ([place]) 05/25/2021   Acute respiratory failure with hypoxia ([place])    Acute right MCA stroke ([place]) 05/15/2021   Uncontrolled hypertension 05/15/2021   Cerebrovascular accident (CVA) ([place]) 05/15/2021   IDA (iron deficiency anemia) 03/01/2021   Healthcare maintenance 03/01/2021  Onychomycosis 03/01/2021   Food insecurity 02/02/2021   Psychosis ([place])    Fibroid 01/11/2021   Menorrhagia 01/11/2021   Acute exacerbation of CHF (congestive heart failure) ([place]) 01/10/2021   AKI (acute kidney injury) ([place])    Symptomatic anemia    SVT (supraventricular tachycardia) ([place]) 05/01/2020   CHF (congestive heart failure) ([place]) 05/01/2020   PCP:  Johny Blamer, DO Pharmacy:   [place], [place] [address] Phone: [phone] Fax: [phone]  [id number] [id number]: [phone]: [id number]: [phone]: [phone]    Social Determinants of Health (SDOH) Interventions    Readmission Risk Interventions     No data to display

## 2022-08-16 NOTE — Progress Notes (Signed)
HD#1 Subjective:   Summary: Holly Bradley is a 46 y.o. 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  Overnight Events: MAPs dropped to 50s-60s overnight after receiving nighttime antihypertensives. Responded to 1L IVF.   Holly Bradley was evaluated at bedside this morning. She reports feeling well and asked when she can go back home. She is eating breakfast and tolerating it well. No pain or other complaints at this time.   Objective:  Vital signs in last 24 hours: Vitals:   08/16/22 0312 08/16/22 0805 08/16/22 1155 08/16/22 1313  BP: 108/63 (!) 144/88 (!) 85/54 (!) 93/55  Pulse: (!) 59 63 66   Resp:  13 12   Temp: (!) 97.5 F (36.4 C) 98.3 F (36.8 C) (!) 97.5 F (36.4 C)   TempSrc: Oral Oral Oral   SpO2: 100% 100% 99%   Weight:   63.9 kg   Height:   '5\' 5"'$  (1.651 m)    Supplemental O2: Room Air SpO2: 99 %   Physical Exam:  General: chronically ill-appearing female, sitting in bed eating breakfast, NAD. CV: normal rate and regular rhythm, no m/r/g. Pulm: normal WOB on RA, no adventitious sounds noted. Abdomen: soft, nondistended, nontender, normoactive bowel sounds. Skin: warm and dry. Neuro: more alert than yesterday, appropriate eye tracking across room, engaged in conversation. Unable to perform strength testing as patient eating breakfast, but grossly moves right side antigravity, unable to do so with left side. Psych: appropriate mood and affect.  Filed Weights   08/16/22 1155  Weight: 63.9 kg     Intake/Output Summary (Last 24 hours) at 08/16/2022 1348 Last data filed at 08/16/2022 4944 Gross per 24 hour  Intake 1192.37 ml  Output 1400 ml  Net -207.63 ml   Net IO Since Admission: -967.44 mL [08/16/22 1348]  Pertinent Labs:    Latest Ref Rng & Units 08/15/2022    4:57 AM  08/14/2022    8:14 PM 08/14/2022    8:10 PM  CBC  WBC 4.0 - 10.5 K/uL 7.2   5.6   Hemoglobin 12.0 - 15.0 g/dL 12.6  11.6  12.3   Hematocrit 36.0 - 46.0 % 38.7  34.0  37.5   Platelets 150 - 400 K/uL 190   254        Latest Ref Rng & Units 08/16/2022    8:22 AM 08/15/2022    4:55 AM 08/14/2022    8:14 PM  CMP  Glucose 70 - 99 mg/dL 94  90    BUN 6 - 20 mg/dL 43  51    Creatinine 0.44 - 1.00 mg/dL 1.94  2.04    Sodium 135 - 145 mmol/L 144  146  145   Potassium 3.5 - 5.1 mmol/L 3.8  3.6  3.4   Chloride 98 - 111 mmol/L 108  109    CO2 22 - 32 mmol/L 26  25    Calcium 8.9 - 10.3 mg/dL 10.9  11.3    Total Protein 6.5 - 8.1 g/dL  8.4    Total Bilirubin 0.3 - 1.2 mg/dL  0.6    Alkaline Phos 38 - 126 U/L  88    AST 15 - 41 U/L  20    ALT 0 - 44 U/L  26      Imaging: No results found.  Assessment/Plan:   Principal Problem:  Seizure disorder ([place]) Active Problems:   AKI (acute kidney injury) ([place])   Uncontrolled hypertension   Chronic HFrEF (heart failure with reduced ejection fraction) ([place])   Patient Summary: Holly Bradley is a 46 y.o. 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 No seizure on EEG.  MRI was negative for any acute pathology and showed stable sequelae of prior infarcts.  This is appears to be most likely a myoclonic seizure due to the effects of of prior strokes.  Little to no supporting evidence for metabolic, infectious, or medication cause. -appreciate neurology assistance -changed to PO keppra '500mg'$  BID -continued her home baclofen, gabapentin, lactulose, Ativan.     AKI Acute urinary retention Patient likely with pre-renal AKI from poor PO  intake prior to arrival. She received 1L overnight with some improvement but not yet back to baseline. Will give another 500cc bolus today and encourage PO intake. Patient also with 900cc of urine this morning on bladder scan, then spontaneously voided ~250cc. PVR of 570cc, requiring in and out catheterization. Will obtain q4 bladder scans to evaluate for ongoing urinary retention of unclear etiology. Possibly from encephalopathic state on presentation. If continues to retain, may need foley catheter placement. -500cc LR bolus -encourage PO intake -q4 bladder scans, trend BMP -foley catheter if retaining >500cc -change bladder scans to every shift if voiding spontaneously  #HTN #HFpEF Last echo on 05/16/2021 revealing EF 55 to 60% and grade 2 diastolic dysfunction. She is euvolemic on exam, but has had a couple of episodes of MAPs dropping to 50s-60s with antihypertensive therapy. Will hold all home antihypertensive therapy aside from clonidine. Would recommend holding hydralazine and imdur at discharge. Would likely benefit from weaning off clonidine in the outpatient setting. Consider restarting home coreg at reduced dose when able. Currently has an AKI so ensure this resolved prior to restarting entresto. -changed clonidine to BID -holding coreg, entresto, hydral, imdur, torsemide  #SVT Remains in normal sinus rhythm.  Restarted her amiodarone.   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  Low concern for DVT/PE at this time.   #IDA: Continued iron supplements when able to tolerate p.o.   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  Patient currently prescribed Lexapro and mirtazapine as well as gabapentin nightly.  Continued when able to take p.o.  Diet: Normal IVF: None,None VTE:  IVC Code: Full PT/OT recs:  Long term care . Family Update: Spoke to brother, POA, in the room and updated him. Please call him twice in a row if needed overnight.   Dispo:  Anticipated discharge to  [facility]  in 1-2 days pending further improvement.   Virl Axe, MD Internal Medicine Resident PGY-3 Pager: [phone]  Please contact the on call pager after 5 pm and on weekends at [phone].

## 2022-08-16 NOTE — NC FL2 (Signed)
[place] MEDICAID FL2 LEVEL OF CARE SCREENING TOOL     IDENTIFICATION  [name] Birthdate: 1975-12-27 Sex: female Admission Date (Current Location): 08/14/2022  [facility] and [place] Number:  Herbalist and Address:  [facility], [place] [address], [place], [place] [address]      Provider Number: [id number]  Attending Physician Name and Address:  Lucious Groves, DO  Relative Name and Phone Number:       Current Level of Care: Hospital Recommended Level of Care: [place] Prior Approval Number:    Date Approved/Denied:   PASRR Number:    Discharge Plan: SNF    Current Diagnoses: Patient Active Problem List   Diagnosis Date Noted   Seizure disorder ([place]) 08/15/2022   Hypercalcemia 12/04/2021   Acute encephalopathy 12/03/2021   Weakness 11/22/2021   Pressure injury of skin 10/13/2021   Lupus anticoagulant positive    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)    Acute blood loss anemia 10/10/2021   Hypertension    Non-ischemic cardiomyopathy (HCC)    Swelling of left hand    Proctocolitis    Constipation    Uterine fibroid    Left hemiparesis ([place])    Hemorrhagic stroke (HCC)    Abnormal uterine bleeding (AUB)    Staphylococcus epidermidis bacteremia    Iron deficiency anemia due to chronic blood loss 05/25/2021   Cerebral edema (HCC) 05/25/2021   Chronic HFrEF (heart failure with reduced ejection fraction) ([place]) 05/25/2021   Pneumonia due to methicillin susceptible Staphylococcus aureus ([place]) 05/25/2021   Acute respiratory failure with hypoxia (HCC)    Acute right MCA stroke ([place]) 05/15/2021   Uncontrolled hypertension 05/15/2021   Cerebrovascular accident (CVA) ([place]) 05/15/2021   IDA (iron deficiency anemia) 03/01/2021   Healthcare maintenance 03/01/2021   Onychomycosis 03/01/2021   Food insecurity 02/02/2021   Psychosis ([place])    Fibroid 01/11/2021    Menorrhagia 01/11/2021   Acute exacerbation of CHF (congestive heart failure) ([place]) 01/10/2021   AKI (acute kidney injury) ([place])    Symptomatic anemia    SVT (supraventricular tachycardia) ([place]) 05/01/2020   CHF (congestive heart failure) ([place]) 05/01/2020    Orientation RESPIRATION BLADDER Height & Weight     Self, Time, Place  Normal Incontinent Weight:   Height:     BEHAVIORAL SYMPTOMS/MOOD NEUROLOGICAL BOWEL NUTRITION STATUS      Continent Diet (regular)  AMBULATORY STATUS COMMUNICATION OF NEEDS Skin   Extensive Assist Verbally Normal                       Personal Care Assistance Level of Assistance  Bathing, Feeding, Dressing Bathing Assistance: Maximum assistance Feeding assistance: Maximum assistance Dressing Assistance: Maximum assistance     Functional Limitations Info  Speech     Speech Info: Impaired (dysarthria)    SPECIAL CARE FACTORS FREQUENCY  PT (By licensed PT), OT (By licensed OT)     PT Frequency: 5x/wk OT Frequency: 5x/wk            Contractures Contractures Info: Not present    Additional Factors Info  Code Status, Allergies, Psychotropic Code Status Info: Full Allergies Info: NKA Psychotropic Info: Lexapro '20mg'$  daily, Remeron 7.'5mg'$  daily at bed         Current Medications (08/16/2022):  This is the current hospital active medication list Current Facility-Administered Medications  Medication Dose Route Frequency Provider Last Rate Last Admin   (  feeding supplement) PROSource Plus liquid 30 mL  30 mL Oral Daily Lucious Groves, DO   30 mL at 08/16/22 7989   acetaminophen (TYLENOL) tablet 1,000 mg  1,000 mg Oral TID Johny Blamer, DO   1,000 mg at 08/16/22 2119   amiodarone (PACERONE) tablet 200 mg  200 mg Oral Daily Johny Blamer, DO   200 mg at 08/16/22 4174   aspirin chewable tablet 81 mg  81 mg Oral Daily Johny Blamer, DO   81 mg at 08/16/22 0948   atorvastatin (LIPITOR) tablet 40 mg  40 mg Oral Daily Johny Blamer, DO    40 mg at 08/16/22 0814   baclofen (LIORESAL) tablet 20 mg  20 mg Oral TID Johny Blamer, DO   20 mg at 08/16/22 4818   bisacodyl (DULCOLAX) suppository 10 mg  10 mg Rectal PRN Johny Blamer, DO       carvedilol (COREG) tablet 6.25 mg  6.25 mg Oral BID WC Virl Axe, MD   6.25 mg at 08/16/22 5631   cloNIDine (CATAPRES) tablet 0.1 mg  0.1 mg Oral BID Virl Axe, MD   0.1 mg at 08/16/22 0949   escitalopram (LEXAPRO) tablet 20 mg  20 mg Oral Daily Johny Blamer, DO   20 mg at 08/16/22 0947   feeding supplement (ENSURE ENLIVE / ENSURE PLUS) liquid 237 mL  237 mL Oral TID BM Johny Blamer, DO   237 mL at 08/16/22 0949   ferrous sulfate tablet 325 mg  325 mg Oral BID WC Johny Blamer, DO   325 mg at 08/16/22 4970   gabapentin (NEURONTIN) capsule 300 mg  300 mg Oral QHS Johny Blamer, DO   300 mg at 08/15/22 2130   HYDROmorphone HCl (DILAUDID) liquid 1 mg  1 mg Oral Q2H PRN Johny Blamer, DO       lactulose ([place]) 10 GM/15ML solution 10 g  10 g Oral BID Johny Blamer, DO   10 g at 08/16/22 2637   levETIRAcetam (KEPPRA) tablet 500 mg  500 mg Oral BID Virl Axe, MD   500 mg at 08/16/22 8588   linaclotide (LINZESS) capsule 145 mcg  145 mcg Oral QAC breakfast Johny Blamer, DO       LORazepam (ATIVAN) tablet 0.5 mg  0.5 mg Oral Q8H PRN Johny Blamer, DO   0.5 mg at 08/15/22 1520   melatonin tablet 6 mg  6 mg Oral QHS Johny Blamer, DO   6 mg at 08/15/22 2128   mirtazapine (REMERON) tablet 7.5 mg  7.5 mg Oral QHS Johny Blamer, DO   7.5 mg at 08/15/22 2130   multivitamin with minerals tablet 1 tablet  1 tablet Oral Daily Johny Blamer, DO   1 tablet at 08/16/22 5027   Muscle Rub CREA 1 Application  1 Application Topical PRN Rick Duff, MD       ondansetron [facility]) tablet 4 mg  4 mg Oral Q8H PRN Johny Blamer, DO       Oral care mouth rinse  15 mL Mouth Rinse 4 times per day Lucious Groves, DO       Oral care mouth rinse  15 mL Mouth Rinse PRN Lucious Groves, DO       pantoprazole (PROTONIX) 2 mg/mL oral suspension 40 mg  40 mg Oral Daily Campbell Riches, MD   40 mg at 08/16/22 0947   polyethylene glycol (MIRALAX / GLYCOLAX) packet 17 g  17 g Oral Daily Johny Blamer, DO   17 g  at 08/16/22 0947   senna-docusate (Senokot-S) tablet 2 tablet  2 tablet Oral BID Johny Blamer, DO   2 tablet at 08/16/22 0949   simethicone (MYLICON) 40 GJ/1.5NB suspension 40 mg  40 mg Oral Q6H PRN Johny Blamer, DO         Discharge Medications: Please see discharge summary for a list of discharge medications.  Relevant Imaging Results:  Relevant Lab Results:   Additional Information SS#: [id number]  Geralynn Ochs, [place]

## 2022-08-16 NOTE — Plan of Care (Signed)
  Problem: Education: Goal: Expressions of having a comfortable level of knowledge regarding the disease process will increase Outcome: Progressing   Problem: Coping: Goal: Ability to adjust to condition or change in health will improve Outcome: Progressing   Problem: Health Behavior/Discharge Planning: Goal: Compliance with prescribed medication regimen will improve Outcome: Progressing   Problem: Medication: Goal: Risk for medication side effects will decrease Outcome: Progressing   Problem: Clinical Measurements: Goal: Complications related to the disease process, condition or treatment will be avoided or minimized Outcome: Progressing   Problem: Safety: Goal: Verbalization of understanding the information provided will improve Outcome: Progressing

## 2022-08-16 NOTE — Progress Notes (Signed)
Bladder scan 949. Dr. Jodell Cipro informed.

## 2022-08-17 LAB — BASIC METABOLIC PANEL
Anion gap: 7 (ref 5–15)
BUN: 50 mg/dL — ABNORMAL HIGH (ref 6–20)
CO2: 27 mmol/L (ref 22–32)
Calcium: 10.5 mg/dL — ABNORMAL HIGH (ref 8.9–10.3)
Chloride: 108 mmol/L (ref 98–111)
Creatinine, Ser: 1.71 mg/dL — ABNORMAL HIGH (ref 0.44–1.00)
GFR, Estimated: 37 mL/min — ABNORMAL LOW (ref >60)
Glucose, Bld: 98 mg/dL (ref 70–99)
Potassium: 3.9 mmol/L (ref 3.5–5.1)
Sodium: 142 mmol/L (ref 135–145)

## 2022-08-17 LAB — GLUCOSE, CAPILLARY
Glucose-Capillary: 104 mg/dL — ABNORMAL HIGH (ref 70–99)
Glucose-Capillary: 111 mg/dL — ABNORMAL HIGH (ref 70–99)
Glucose-Capillary: 142 mg/dL — ABNORMAL HIGH (ref 70–99)
Glucose-Capillary: 91 mg/dL (ref 70–99)
Glucose-Capillary: 98 mg/dL (ref 70–99)

## 2022-08-17 MED ORDER — LACTATED RINGERS IV SOLN: INTRAVENOUS | Status: AC

## 2022-08-17 MED ORDER — CARVEDILOL 12.5 MG PO TABS
12.5000 mg | ORAL_TABLET | Freq: Two times a day (BID) | ORAL | Status: DC
Start: 2022-08-17 — End: 2022-08-20
  Administered 2022-08-17 – 2022-08-20 (×7): 12.5 mg via ORAL
  Filled 2022-08-17 (×7): qty 1

## 2022-08-17 NOTE — Plan of Care (Signed)

## 2022-08-17 NOTE — Progress Notes (Signed)
HD#2 Subjective:   Overnight Events: No acute events   Holly Bradley is resting in bed comfortably in no acute distress. She denies any new symptoms. Discussed need to continue medication management with plan to discharge in coming days back to facility. No acute concerns.   Objective:  Vital signs in last 24 hours: Vitals:   08/17/22 0730 08/17/22 0732 08/17/22 1211 08/17/22 1535  BP:  (!) 147/82 102/64 139/77  Pulse:  65 64 66  Resp:  '18 16 18  '$ Temp:  97.8 F (36.6 C) 98 F (36.7 C) 98.3 F (36.8 C)  TempSrc:      SpO2: 100% 100% 96% 100%  Weight:      Height:       Supplemental O2: Room Air SpO2: 100 %   Physical Exam:  Constitutional: no acute distress HENT: normocephalic atraumatic Eyes: conjunctiva non-erythematous Neck: supple Cardiovascular: regular rate and rhythm, no m/r/g Pulmonary/Chest: normal work of breathing on room air MSK: normal bulk and tone Neurological: alert & oriented, inability to move left extremities.  Skin: warm and dry Psych: pleasant  Filed Weights   08/16/22 1155  Weight: 63.9 kg     Intake/Output Summary (Last 24 hours) at 08/17/2022 2021 Last data filed at 08/17/2022 1053 Gross per 24 hour  Intake 250 ml  Output 650 ml  Net -400 ml   Net IO Since Admission: -1,444.33 mL [08/17/22 2021]  Pertinent Labs:    Latest Ref Rng & Units 08/15/2022    4:57 AM 08/14/2022    8:14 PM 08/14/2022    8:10 PM  CBC  WBC 4.0 - 10.5 K/uL 7.2   5.6   Hemoglobin 12.0 - 15.0 g/dL 12.6  11.6  12.3   Hematocrit 36.0 - 46.0 % 38.7  34.0  37.5   Platelets 150 - 400 K/uL 190   254        Latest Ref Rng & Units 08/17/2022    3:48 AM 08/16/2022    8:22 AM 08/15/2022    4:55 AM  CMP  Glucose 70 - 99 mg/dL 98  94  90   BUN 6 - 20 mg/dL 50  43  51   Creatinine 0.44 - 1.00 mg/dL 1.71  1.94  2.04   Sodium 135 - 145 mmol/L 142  144  146   Potassium 3.5 - 5.1 mmol/L 3.9  3.8  3.6   Chloride 98 - 111 mmol/L 108  108  109   CO2 22 - 32 mmol/L '27   26  25   '$ Calcium 8.9 - 10.3 mg/dL 10.5  10.9  11.3   Total Protein 6.5 - 8.1 g/dL   8.4   Total Bilirubin 0.3 - 1.2 mg/dL   0.6   Alkaline Phos 38 - 126 U/L   88   AST 15 - 41 U/L   20   ALT 0 - 44 U/L   26     Assessment/Plan:   Principal Problem:   Seizure disorder (HCC) Active Problems:   AKI (acute kidney injury) ([place])   Uncontrolled hypertension   Chronic HFrEF (heart failure with reduced ejection fraction) ([place])   Seizure-like activity (HCC)  Patient Summary: Holly Bradley is a 46 y.o. 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 EEG and MRI negative for any acute changes, however, with extent of prior CVA likely post CVA seizures. Has not had any further episodes on keppra and doing well.  -changed to PO keppra '500mg'$  BID -continued her home baclofen, gabapentin, lactulose, Ativan.     Acute kidney injury Acute urinary retention Thought to be pre-renal. Last Cr of .69 8 months ago. Improved to 1.7 from 2.0 on admission. Will continue IV fluids. Of note, bladder scan with over 900 cc's overnight, foley catheter placed.  -encourage PO intake, 75 cc per hour LR for 10 hrs -foley catheter placed 8/26   HTN Variable blood pressures on admission, unclear if she is taking them at home as she was profoundly hypotensive when they were all restarted. Will continue coreg 12.5 BID, clonidine .1 mg BID. Continue to hold entresto with AKI. Improvement in BP today.   HFpEF, unable to determine NYHA/Stage Last echo on 05/16/2021 revealing EF 55 to 60% and grade 2 diastolic dysfunction. She is euvolemic on exam with episodes of hypovolemia thought to be secondary to her antihypertensives.  Holding entresto with AKI, would benefit from SGLT-2 if able to tolerate.  -continue to monitor fluid status   Hx of SVT Continue amiodarone.   Hx of DVT s/p IVC filter IVC filter in place.  Low concern for DVT/PE at this time.   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 Continue Lexapro and mirtazapine as well as gabapentin nightly.   Diet: Normal IVF: LR,75cc/hr VTE: SCD and IVC in place Code: Full  Dispo: Anticipated discharge to [facility] in 1-2 days pending resolution of AKI   [place] [facility] Resident PGY-3 Please contact the on call pager after 5 pm and on weekends at [phone].

## 2022-08-18 LAB — GLUCOSE, CAPILLARY
Glucose-Capillary: 90 mg/dL (ref 70–99)
Glucose-Capillary: 90 mg/dL (ref 70–99)
Glucose-Capillary: 134 mg/dL — ABNORMAL HIGH (ref 70–99)
Glucose-Capillary: 119 mg/dL — ABNORMAL HIGH (ref 70–99)

## 2022-08-18 LAB — BASIC METABOLIC PANEL
Anion gap: 7 (ref 5–15)
BUN: 45 mg/dL — ABNORMAL HIGH (ref 6–20)
CO2: 26 mmol/L (ref 22–32)
Calcium: 10.7 mg/dL — ABNORMAL HIGH (ref 8.9–10.3)
Chloride: 109 mmol/L (ref 98–111)
Creatinine, Ser: 1.47 mg/dL — ABNORMAL HIGH (ref 0.44–1.00)
GFR, Estimated: 44 mL/min — ABNORMAL LOW (ref >60)
Glucose, Bld: 94 mg/dL (ref 70–99)
Potassium: 4 mmol/L (ref 3.5–5.1)
Sodium: 142 mmol/L (ref 135–145)

## 2022-08-18 NOTE — Progress Notes (Signed)
HD#3 Subjective:   Overnight Events: No acute events   [name] is resting in bed comfortably in no acute distress. She denies any new symptoms and is eating and drinking well. Brother presented at bedside and updated him on clinical course. All questions answered.   Objective:  Vital signs in last 24 hours: Vitals:   08/18/22 0848 08/18/22 1123 08/18/22 1611 08/18/22 2010  BP: (!) 168/80 (!) 198/89 (!) 158/78 122/88  Pulse: 61 62 66   Resp:  '16 16 16  '$ Temp:  98 F (36.7 C) 98 F (36.7 C) 99.1 F (37.3 C)  TempSrc:  Oral Oral Oral  SpO2:  100% 100% 99%  Weight:      Height:       Supplemental O2: Room Air SpO2: 99 %  Physical Exam:  Constitutional: no acute distress HENT: normocephalic atraumatic Eyes: conjunctiva non-erythematous Neck: supple Cardiovascular: regular rate and rhythm, no m/r/g Pulmonary/Chest: normal work of breathing on room air MSK: normal bulk and tone Neurological: alert & oriented, limited left extremity exam from residual deficits.  Skin: warm and dry. No evidence of pressure ulcers to heals.  Psych: pleasant  Filed Weights   08/16/22 1155  Weight: 63.9 kg     Intake/Output Summary (Last 24 hours) at 08/18/2022 2051 Last data filed at 08/18/2022 1700 Gross per 24 hour  Intake 1080 ml  Output 1525 ml  Net -445 ml    Net IO Since Admission: -1,889.33 mL [08/18/22 2051]  Pertinent Labs:    Latest Ref Rng & Units 08/15/2022    4:57 AM 08/14/2022    8:14 PM 08/14/2022    8:10 PM  CBC  WBC 4.0 - 10.5 K/uL 7.2   5.6   Hemoglobin 12.0 - 15.0 g/dL 12.6  11.6  12.3   Hematocrit 36.0 - 46.0 % 38.7  34.0  37.5   Platelets 150 - 400 K/uL 190   254        Latest Ref Rng & Units 08/18/2022    8:06 AM 08/17/2022    3:48 AM 08/16/2022    8:22 AM  CMP  Glucose 70 - 99 mg/dL 94  98  94   BUN 6 - 20 mg/dL 45  50  43   Creatinine 0.44 - 1.00 mg/dL 1.47  1.71  1.94   Sodium 135 - 145 mmol/L 142  142  144   Potassium 3.5 - 5.1 mmol/L 4.0   3.9  3.8   Chloride 98 - 111 mmol/L 109  108  108   CO2 22 - 32 mmol/L '26  27  26   '$ Calcium 8.9 - 10.3 mg/dL 10.7  10.5  10.9     Assessment/Plan:   Principal Problem:   Seizure disorder ([place]) Active Problems:   AKI (acute kidney injury) ([place])   Uncontrolled hypertension   Chronic HFrEF (heart failure with reduced ejection fraction) ([place])   Seizure-like activity ([place])  Patient Summary: Holly Bradley is a 46 y.o. 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  Acute kidney injury Acute urinary retention Thought to be pre-renal. Last Cr of .69 8 months ago. Improved to 1.4 from 1.7  on admission. 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 EEG and MRI negative for any acute changes, however, with extent of prior CVA likely post CVA seizures. Has not had any further episodes on keppra and doing well. No further seizure activity since admission.  -changed to PO keppra '500mg'$  BID -continued her home baclofen, gabapentin, lactulose, Ativan.    HTN Variable blood pressures on admission, unclear if she is taking them at home as she was profoundly hypotensive when they were all restarted. Will continue coreg 12.5 BID, clonidine .1 mg BID. Continue to hold entresto with AKI. Improvement in BP, last BP of 122/88.   HFpEF, unable to determine NYHA/Stage Last echo on 05/16/2021 revealing EF 55 to 60% and grade 2 diastolic dysfunction. She is euvolemic on exam with episodes of hypovolemia thought to be secondary to her antihypertensives. Holding entresto with AKI, would benefit from SGLT-2 if able to tolerate.  -continue to  monitor fluid status   Hx of SVT Continue amiodarone.   Hx of DVT s/p IVC filter IVC filter in place.  Low concern for DVT/PE at this time.   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 Continue Lexapro and mirtazapine as well as gabapentin nightly.   Diet: Normal IVF: LR,75cc/hr VTE: SCD and IVC in place Code: Full  Dispo: Anticipated discharge to [facility] in 1-2 days pending resolution of AKI   [place] [facility] Resident PGY-3 Please contact the on call pager after 5 pm and on weekends at [phone].

## 2022-08-19 DIAGNOSIS — I5030 Unspecified diastolic (congestive) heart failure: Secondary | ICD-10-CM

## 2022-08-19 LAB — CULTURE, BLOOD (ROUTINE X 2)
Culture: NO GROWTH
Culture: NO GROWTH

## 2022-08-19 LAB — BASIC METABOLIC PANEL
Anion gap: 9 (ref 5–15)
BUN: 35 mg/dL — ABNORMAL HIGH (ref 6–20)
CO2: 27 mmol/L (ref 22–32)
Calcium: 10.7 mg/dL — ABNORMAL HIGH (ref 8.9–10.3)
Chloride: 107 mmol/L (ref 98–111)
Creatinine, Ser: 1.15 mg/dL — ABNORMAL HIGH (ref 0.44–1.00)
GFR, Estimated: 59 mL/min — ABNORMAL LOW (ref >60)
Glucose, Bld: 102 mg/dL — ABNORMAL HIGH (ref 70–99)
Potassium: 3.8 mmol/L (ref 3.5–5.1)
Sodium: 143 mmol/L (ref 135–145)

## 2022-08-19 LAB — GLUCOSE, CAPILLARY
Glucose-Capillary: 85 mg/dL (ref 70–99)
Glucose-Capillary: 82 mg/dL (ref 70–99)
Glucose-Capillary: 106 mg/dL — ABNORMAL HIGH (ref 70–99)

## 2022-08-19 MED ORDER — HYDRALAZINE HCL 50 MG PO TABS
100.0000 mg | ORAL_TABLET | Freq: Three times a day (TID) | ORAL | Status: DC
  Administered 2022-08-19 – 2022-08-20 (×4): 100 mg via ORAL
  Filled 2022-08-19 (×4): qty 2

## 2022-08-19 MED ORDER — CHLORHEXIDINE GLUCONATE CLOTH 2 % EX PADS
6.0000 | MEDICATED_PAD | Freq: Every day | CUTANEOUS | Status: DC
  Administered 2022-08-19 – 2022-08-20 (×2): 6 via TOPICAL

## 2022-08-19 MED ORDER — HYDRALAZINE HCL 50 MG PO TABS: 100.0000 mg | ORAL_TABLET | Freq: Three times a day (TID) | ORAL | Status: DC

## 2022-08-19 NOTE — Progress Notes (Signed)
Speech Language Pathology Treatment: Dysphagia  Patient Details Name: Holly Bradley MRN: [id number] DOB: 06/30/1976 Today's Date: 08/19/2022 Time: 8483-5075 SLP Time Calculation (min) (ACUTE ONLY): 19 min  Assessment / Plan / Recommendation Clinical Impression  Pt was seen for dysphagia treatment and was cooperative throughout the session. Pt, and nursing reported that the pt has been tolerating the current diet signs of aspiration if she observes swallowing precautions. Pt stated that she sometimes drinks too quickly when using a straw and coughs thereafter. Per the pt, straws have been restricted at her facility for this reason. Pt was seen during lunch with a meal which included pizza, a salad, a rice crispy treat, and thin liquids. Pt tolerated thin liquids via straw when using individual swallows, but inconsistently exhibited coughing when consecutive swallows were used. Pt tolerated all solids and thin liquids via cup using individual and consecutive swallows without overt s/s of aspiration. Mastication was [facility] and oral clearance was adequate. Anterior spillage was intermittently noted. Pt's current diet will be continued with observance of swallowing precautions. Further acute skilled SLP services are not clinically indicated at this time.    HPI HPI: Pt is a 46 year old female who presented to the ED following an episode of convulsions. EMS noted jerking motions of her right upper and lower extremities and right-sided gaze deviation. 5 mg IM Versed administered and these motions concluded. Per H&P, pt reported some pain in her face and around her jaw and states that she is unable to open her mouth fully. MRI brain negative, EEG normal. 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 MBS 12/07/21: impaired bolus cohesion, impaired mastication, weak bolus manipulation, and a pharyngeal delay. Aspiration  of thin liquid with delayed sensation; dysphagia 1 with nectar thick liquids recommended, pt subsequently advanced to dysphagia 2 with thin liquids on 12/12/21.      SLP Plan  All goals met;Discharge SLP treatment due to (comment)      Recommendations for follow up therapy are one component of a multi-disciplinary discharge planning process, led by the attending physician.  Recommendations may be updated based on patient status, additional functional criteria and insurance authorization.    Recommendations  Diet recommendations: Regular;Thin liquid Liquids provided via: Cup;No straw Medication Administration: Crushed with puree Supervision: Full supervision/cueing for compensatory strategies;Staff to assist with self feeding Compensations: Slow rate;Small sips/bites (No straws preferred, but avoid consecutive swallows if straws are used) Postural Changes and/or Swallow Maneuvers: Seated upright 90 degrees                Oral Care Recommendations: Oral care BID Follow Up Recommendations:  (TBD) SLP Visit Diagnosis: Dysphagia, unspecified (R13.10) Plan: All goals met;Discharge SLP treatment due to (comment)          Holly Bradley I. Holly Bradley, [place], [place] Office number [phone]  Holly Bradley  08/19/2022, 12:19 PM

## 2022-08-19 NOTE — Progress Notes (Signed)
Subjective:  Patient states that she is feeling well overall and enjoys being here in the hospital. Updated patient on improving kidney function.  Updated patient on plan to add her home hydralazine for hypertension.  Discussed that she would likely be discharged tomorrow.  Patient states that she does not want the Foley catheter upon discharge.  Discussed plan to start a voiding trial to see if Foley catheter can be removed.  Objective:  Vital signs in last 24 hours: Vitals:   08/18/22 2355 08/19/22 0350 08/19/22 0731 08/19/22 1200  BP: (!) 158/86 (!) 165/84 (!) 161/87 (!) 182/92  Pulse:   70 72  Resp: '16 16 16 17  '$ Temp:  98.5 F (36.9 C) 97.7 F (36.5 C) 97.6 F (36.4 C)  TempSrc:  Oral Oral Oral  SpO2: 95% 95% 98% 98%  Weight:      Height:       Physical Exam: Constitutional: not in acute distress Eyes: conjunctiva non-erythematous Cardiovascular: regular rate and rhythm. No murmurs, rubs, or gallops.  Pulmonary: normal work of breathing on room air. No wheezes, rales, or rhonchi.  Neurological: alert & oriented, limited left extremity exam secondary to residual deficits.  Skin: warm and dry. No pressure ulcers noted.  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   # Acute kidney injury # Acute urinary retention Likely prerenal.  Creatinine 0.69 from 8 months ago.  Creatinine improved to 1.15 today.  Foley catheter placed on 8/26 for urinary retention.  Will attempt to remove Foley  catheter prior to discharge.  Initial bladder scan at 1600 demonstrated 29 mL.  Will replace Foley if patient continues to retain urine. - Encourage PO intake - Continue voiding trials and bladder scans q6 hours - Trend BMP   2. # Post-stroke epilepsy # History of right MMA CVA Presented with RUE twitching per [facility] staff. EEG and MRI negative for acute pathology. Likely secondary to previous CVA.  No further episodes of twitching since admission.  - PO keppra '500mg'$  BID - Continue home baclofen, gabapentin, lactulose, Ativan.     3. # HTN Unclear if patient was taking all of her antihypertensive medications at [place].  Hypertensive in the 161W-960A systolically today. - Hold entresto due to AKI - Continue coreg 12.5 BID, clonidine .1 mg BID - Restart Hydralazine 100 mg TID    4. # HFpEF (unable to determine NYHA/Stage) Echo in 04/2021 demonstrated an EF of 55-60% with grade 2 diastolic dysfunction.  Continues to be euvolemic on exam.  Patient could benefit from a SGLT2 if tolerable. - Hold entresto due to AKI - Continue to monitor fluid status  5. # Hx of SVT - Continue amiodarone.   6. # Hx of DVT s/p IVC filter IVC filter in place.  Low concern for DVT/PE.   7. # IDA -Continue iron supplements   8. # Psychosis # Possible MDD/GAD History of paranoid psychosis. - Continue Lexapro and mirtazapine - Gabapentin nightly   Diet: Normal IVF: None VTE: SCD, IVC in place Code: Full  Prior to Admission Living Arrangement: [facility]  Anticipated Discharge Location: [facility] Barriers to Discharge: continued management Dispo: Anticipated discharge in approximately in less than 2 day(s).   Starlyn Skeans, MD 08/19/2022, 5:18 PM Pager: [phone] After 5pm on weekdays and 1pm on weekends: On Call pager [phone]

## 2022-08-19 NOTE — Progress Notes (Signed)
Removed indwelling cath.

## 2022-08-20 DIAGNOSIS — N179 Acute kidney failure, unspecified: Secondary | ICD-10-CM | POA: Diagnosis not present

## 2022-08-20 DIAGNOSIS — I110 Hypertensive heart disease with heart failure: Secondary | ICD-10-CM | POA: Diagnosis not present

## 2022-08-20 DIAGNOSIS — I5032 Chronic diastolic (congestive) heart failure: Secondary | ICD-10-CM | POA: Diagnosis not present

## 2022-08-20 DIAGNOSIS — G40909 Epilepsy, unspecified, not intractable, without status epilepticus: Secondary | ICD-10-CM | POA: Diagnosis not present

## 2022-08-20 LAB — BASIC METABOLIC PANEL
Anion gap: 7 (ref 5–15)
BUN: 32 mg/dL — ABNORMAL HIGH (ref 6–20)
CO2: 25 mmol/L (ref 22–32)
Calcium: 10.8 mg/dL — ABNORMAL HIGH (ref 8.9–10.3)
Chloride: 106 mmol/L (ref 98–111)
Creatinine, Ser: 1.17 mg/dL — ABNORMAL HIGH (ref 0.44–1.00)
GFR, Estimated: 58 mL/min — ABNORMAL LOW (ref >60)
Glucose, Bld: 89 mg/dL (ref 70–99)
Potassium: 3.9 mmol/L (ref 3.5–5.1)
Sodium: 138 mmol/L (ref 135–145)

## 2022-08-20 LAB — GLUCOSE, CAPILLARY: Glucose-Capillary: 122 mg/dL — ABNORMAL HIGH (ref 70–99)

## 2022-08-20 MED ORDER — LEVETIRACETAM 500 MG PO TABS
500.0000 mg | ORAL_TABLET | Freq: Two times a day (BID) | ORAL | Status: AC
Start: 2022-08-20 — End: ?

## 2022-08-20 MED ORDER — SACUBITRIL-VALSARTAN 49-51 MG PO TABS
1.0000 | ORAL_TABLET | Freq: Once | ORAL | Status: DC
  Filled 2022-08-20 (×2): qty 1

## 2022-08-20 NOTE — Discharge Instructions (Addendum)
You were admitted for seizure-like activity.   Hospital Course: - We treated you with medications to decrease the chance of seizure-like activity reoccurring. We put in a foley catheter because you were not getting rid of urine correctly. We removed the foley catheter after you began clearing your urine appropriately. We treated your high blood pressure with medications.   Medications: Please start taking: -Levetiracetam (keppra) 500 mg table (twice by mouth daily) to prevent seizures  Please change how you take: -Sacubitril-valsartan (entresto) 49-51 MG (once by mouth daily) for high blood pressure  Please stop taking: -Isosorbide mononitrate (imdur) 60 mg table (once by mouth daily) for your high blood pressure -Magnesium hydroxide (milk of magnesia) (30 mL by mouth) for constipation -Sodium Phosphates (RA saline enema) for constipation -Torsemide (Demadex) 20 mg (by mouth once daily) for your high blood pressure  Please continue taking: -Acetaminophen (tylenol) 500 mg tablet (2 tablets by mouth daily) for pain -Amiodarone (pacerone) 200 mg tablet (once by mouth daily) to control your heart rate -Aspirin 81 MG chewable tablet (1 by mouth daily) for your history of stroke -Amino Acids-proteins hydrolys (feeding supplement) (30 mL by mouth at morning and at bedtime) for your nutrition -Atorvastatin (Lipitor) 40 mg tablet (once by mouth daily) for your high cholesterol -Baclofen (lioresal) 20 mg tablet (by mouth 3 times daily) for pain -Bisacodyl (dulcolax) 10 mg suppository (once rectally as needed) for constipation -Carvedilol (coreg) 25 mg tablet (once daily) for your high blood pressure -Menthol-Methyl Salicylate ([employer] muscle rub; apply to skin every 12 hours as needed) for pain -Escitalopram (lexapro) 20 mg tablet for your depression and anxiety -Nutritional supplement LIQD (60 mls by mouth at morning, noon, and bedtime) for your nutrition -Feeding supplement (ensure enlive/plus)  LIQD (237 mLs by mouth 3 times daily) for your nutrition -Ferrous sulfate 325 Mg table (twice by mouth daily) for your anemia -Gabapentin (neurontin) 300 mg capsule (once by mouth at bedtime) for nerve pain -Hydralazine (apresoline) 100 mg tablet (by mouth 3 times daily) for your high blood pressure -Hydromorphone (dilaudid) (66m by mouth every 2 hours as needed) for pain -Lactulose (chonulac) 10 gm/15 mL solution (15 mLs by mouth twice daily) for constipation -Linaclotide (linzess) 145 mcg capsule (once by mouth daily before breakfast) for constipation -Lorazepam (ativan) 0.5 mg (by mouth every 8 hours as needed) for anxiety -Melatonin 3 mg capsules (two capsules by mouth at bedtime) for sleep -Mirtazapine (remeron) 7.5 mg tablet (once by mouth at bedtime) for sleep -Multiple vitamin (multivitamin) (once by mouth daily) for your overall health -Ondansetron (zofran) 4 mg tablet (by mouth every 8 hours as needed) for nausea and vomiting -Tromaline Salicylate (pain relieving cream) (apply to skin 4 times daily as needed) for pain -Pantoprazole (protonix) 40 mg tablet (once by mouth daily) for acid reflux -Polyethylene glycol (miralax/glycolax) 17g packet (17g by mouth daily) for constipation -Senna-docusate (senokot) 8.6-50 mg tablet (two tablets by mouth daily) for constipation -Simethicone (Mylicon) 40 mFX/9.0mL drops (0.6 mL every 6 hours as needed) for flatulence -Camphor-Eucalyptus-Menthol (vicks vaporub) (apply to skin 3 times daily as needed) for congestion  Follow Up: - [facility]

## 2022-08-20 NOTE — Progress Notes (Signed)
Attempted to call report to [employer].  Secretary answered and transferred me to nurses station, but nobody answered.  Will attempt to call report again at a later time.  Peripheral IV and telemetry box has been removed.  Discharge paperwork has been printed and placed in folder.

## 2022-08-20 NOTE — Discharge Summary (Addendum)
Name: Holly Bradley MRN: [id number] DOB: 1976-02-02 46 y.o. PCP: Johny Blamer, DO  Date of Admission: 08/14/2022  7:44 PM Date of Discharge: 08/20/22 Attending Physician: Sid Falcon, MD  Discharge Diagnosis: 1. Principal Problem:   Seizure disorder ([place]) Active Problems:   AKI (acute kidney injury) ([place])   Uncontrolled hypertension   Chronic HFrEF (heart failure with reduced ejection fraction) ([place])   Seizure-like activity ([place])   Discharge Medications: Allergies as of 08/20/2022   No Known Allergies      Medication List     STOP taking these medications    isosorbide mononitrate 60 MG 24 hr tablet Commonly known as: IMDUR   MILK OF MAGNESIA PO   RA SALINE ENEMA RE   torsemide 20 MG tablet Commonly known as: DEMADEX       TAKE these medications    acetaminophen 500 MG tablet Commonly known as: TYLENOL Take 2 tablets (1,000 mg total) by mouth in the morning, at noon, and at bedtime.   amiodarone 200 MG tablet Commonly known as: PACERONE Take 200 mg by mouth daily.   aspirin 81 MG chewable tablet Chew 1 tablet (81 mg total) by mouth daily.   atorvastatin 40 MG tablet Commonly known as: LIPITOR Take 1 tablet (40 mg total) by mouth daily.   baclofen 20 MG tablet Commonly known as: LIORESAL Take 1 tablet (20 mg total) by mouth 3 (three) times daily.   bisacodyl 10 MG suppository Commonly known as: DULCOLAX Place 10 mg rectally as needed (if constipation is not relieved by MOM).   carvedilol 25 MG tablet Commonly known as: COREG Take 1 tablet (25 mg total) by mouth 2 (two) times daily with a meal.   cloNIDine 0.1 MG tablet Commonly known as: CATAPRES Take 1 tablet (0.1 mg total) by mouth 3 (three) times daily.   [employer] Muscle Rub 10-15 % Crea Apply 1 Application topically every 12 (twelve) hours as needed (pain).   escitalopram 20 MG tablet Commonly known as: LEXAPRO Take 20 mg by mouth daily.   feeding supplement (PRO-STAT SUGAR  FREE 64) Liqd Take 30 mLs by mouth in the morning and at bedtime.   Nutritional Supplement Liqd Take 60 mLs by mouth in the morning, at noon, and at bedtime.   feeding supplement Liqd Take 237 mLs by mouth 3 (three) times daily between meals.   ferrous sulfate 325 (65 FE) MG tablet Take 1 tablet (325 mg total) by mouth 2 (two) times daily with a meal.   gabapentin 300 MG capsule Commonly known as: NEURONTIN Take 300 mg by mouth at bedtime.   hydrALAZINE 100 MG tablet Commonly known as: APRESOLINE Take 1 tablet (100 mg total) by mouth 3 (three) times daily.   HYDROmorphone HCl 1 MG/ML Liqd Commonly known as: DILAUDID Take 1 mL (1 mg total) by mouth every 2 (two) hours as needed for severe pain or moderate pain (distress, shortness of breath, RR >25, dyspnea, premedicating prior to turning/moving **Comfort care**).   lactulose 10 GM/15ML solution Commonly known as: CHRONULAC Take 15 mLs (10 g total) by mouth 2 (two) times daily.   levETIRAcetam 500 MG tablet Commonly known as: KEPPRA Take 1 tablet (500 mg total) by mouth 2 (two) times daily.   linaclotide 145 MCG Caps capsule Commonly known as: LINZESS Take 145 mcg by mouth daily before breakfast.   LORazepam 0.5 MG tablet Commonly known as: ATIVAN Take 0.5 mg by mouth every 8 (eight) hours as needed for anxiety (restlessness).  Melatonin 3 MG Caps Take 6 mg by mouth at bedtime.   mirtazapine 7.5 MG tablet Commonly known as: REMERON Take 7.5 mg by mouth at bedtime.   multivitamin with minerals Tabs tablet Take 1 tablet by mouth daily.   ondansetron 4 MG tablet Commonly known as: ZOFRAN Take 4 mg by mouth every 8 (eight) hours as needed for nausea or vomiting.   Pain Relieving 10 % cream Generic drug: trolamine salicylate Apply 1 Application topically 4 (four) times daily as needed (pain).   pantoprazole 40 MG tablet Commonly known as: PROTONIX Take 40 mg by mouth daily.   polyethylene glycol 17 g  packet Commonly known as: MIRALAX / GLYCOLAX Take 17 g by mouth daily.   sacubitril-valsartan 49-51 MG Commonly known as: ENTRESTO Take 1 tablet by mouth daily. What changed: Another medication with the same name was removed. Continue taking this medication, and follow the directions you see here.   senna-docusate 8.6-50 MG tablet Commonly known as: Senokot-S Take 2 tablets by mouth 2 (two) times daily.   simethicone 40 MG/0.6ML drops Commonly known as: MYLICON Take 0.6 mLs (40 mg total) by mouth every 6 (six) hours as needed for flatulence.   VICKS VAPORUB EX Apply 1 Application topically 3 (three) times daily as needed (congestion).        Disposition and follow-up:   Holly Bradley was discharged from [facility] in Good condition.  At the hospital follow up visit please address:  1.  Seizure-like activity  2.  Labs / imaging needed at time of follow-up: BMP  3.  Pending labs/ test needing follow-up: none  Follow-up Appointments:  Contact information for after-discharge care     Destination     [facility] Preferred SNF .   Service: Skilled Nursing Contact information: [address] 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  Description of seizure has some discrepancies with handoff from EMS mentioning right-sided convulsions and handout sheet from provider at SNF mentioning bilateral convulsions, so uncertain if focal or general myoclonic seizure at this point.  Patient was unable to recall what happened and was postictal on admission with acute confusion and slurred speech with questionable weakness due to chronic left-sided deficits.  She did not show any seizure activity during her admission and this was confirmed by  EEG.  MRI was negative for any acute pathology and showed stable sequelae of prior infarcts.  Urinalysis did not show any sign of prolonged time time with concern for rhabdomyolysis.  CK of 126 and lactic of 1.3 on admission.  UDS positive for benzodiazepines, which she is prescribed.  This is appears to be most likely a myoclonic seizure due to the effects of of prior strokes.  Little to no supporting evidence for metabolic, infectious, or medication cause.  Neurology was consulted and started Keppra 500 mg twice daily after a loading dose.  Otherwise we continued her home medications including baclofen, gabapentin, lactulose, Ativan.  After speaking with her brother who is POA and confirming that she is at her baseline she was discharged back to [facility] with a prescription for Keppra 500 mg twice a day.   #HTN #HFpEF Last echo on 05/16/2021 revealing EF 55 to 60% and  grade 2 diastolic dysfunction.  Patient was euvolemic and was not having any dyspnea or increased oxygen requirements.  Her blood pressures were labile however she had not had her medications and was originally unable to take medications by mouth. Blood pressures subsequently elevated and her home hydralazine and entreso 49-51 mg were restarted. Patient remained euvolemic, imdur and torsemide have not been restarted given initial borderline BP and AKI. Her volume status and blood pressure will need to be monitored at her facility and restarted on imdur/torsemide as necessary.    # Acute kidney injury # Acute urinary retention Likely prerenal. Creatinine 0.69 from 8 months ago. Creatinine steadily improved, 1.17 at discharge. Foley catheter placed on 8/26 for urinary retention. Voiding trials were started w/ q6 bladder scanning. Initial bladder scan at demonstrated 29 mL. Repeat bladder scans at 47 mL and 38 mL. Foley kept off as the patient was voiding her urine appropriately.   #SVT Patient remained in normal sinus rhythm during  admission and when able to take p.o. medications we restarted her amiodarone.   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  Low concern for DVT/PE throughout her stay. SCDs used.    #IDA: Continued iron supplements when able to tolerate p.o.   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  Patient currently prescribed Lexapro and mirtazapine as well as gabapentin nightly.  Continued Lexapro, mirtazapine, and gabapentin.   Discharge Exam:   BP (!) 178/89 (BP Location: Right Arm)   Pulse 66   Temp 98.2 F (36.8 C) (Oral)   Resp 16   Ht '5\' 5"'$  (1.651 m)   Wt 63.9 kg   SpO2 100%   BMI 23.44 kg/m   Constitutional: not in acute distress Eyes: conjunctiva non-erythematous Cardiovascular: regular rate and rhythm. No murmurs, rubs, or gallops.  Pulmonary: normal work of breathing on room air. No wheezes, rales, or rhonchi.  Neurological: alert & oriented, limited left extremity exam secondary to residual deficits.  Skin: warm and dry. No pressure ulcers noted.  Psych: pleasant  Pertinent Labs, Studies, and Procedures:  EEG adult  Result Date: 08/15/2022 Lora Havens, MD     08/15/2022  8:57 AM [name] MRN: [id number] Epilepsy Attending: Lora Havens Referring Physician/Provider: Lorenza Chick, MD Date: 08/15/2022 Duration: 21.15 mins Patient history: 46 y.o. 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 EEG to evaluate for seizure Level of alertness: Awake AEDs during EEG study: LEV Technical aspects: This EEG study was done with scalp electrodes positioned according to the 10-20 International system of electrode placement. Electrical activity was reviewed with band pass filter of 1-'70Hz'$ , sensitivity of 7 uV/mm, display speed of 46m/sec with a '60Hz'$  notched filter applied as appropriate. EEG data were recorded continuously  and digitally stored.  Video monitoring was available and reviewed as appropriate. Description: The posterior dominant rhythm consists of 8-9 Hz activity of moderate voltage (25-35 uV) seen predominantly in posterior head regions, symmetric and reactive to eye opening and eye closing. Hyperventilation and photic stimulation were not performed.   Of note, study was technically difficult due to significant electrode and movement artifact. IMPRESSION: This technically difficult study is within normal limits. No seizures or epileptiform discharges were seen throughout the recording. A normal interictal EEG does not exclude nor support the diagnosis of epilepsy. If suspicion for interictal activity remains a concern, a prolonged study can be considered.  Lora Havens   MR BRAIN W WO CONTRAST  Result Date: 08/15/2022 CLINICAL DATA:  New onset seizure EXAM: MRI HEAD WITHOUT AND WITH CONTRAST TECHNIQUE: Multiplanar, multiecho pulse sequences of the brain and surrounding structures were obtained without and with intravenous contrast. CONTRAST:  6.34m GADAVIST GADOBUTROL 1 MMOL/ML IV SOLN COMPARISON:  12/03/2021 FINDINGS: Brain: No acute infarct, mass effect or extra-axial collection. Hemosiderin deposition over the right hemisphere. No acute fracture. Large area of encephalomalacia in the right MCA territory. Moderate bilateral white matter hyperintense T2-weighted signal. Moderate generalized atrophy. There is an old left cerebellar infarct. The midline structures are normal. There is no abnormal contrast enhancement. Vascular: Major flow voids are preserved. Skull and upper cervical spine: Normal calvarium and skull base. Visualized upper cervical spine and soft tissues are normal. Sinuses/Orbits:No paranasal sinus fluid levels or advanced mucosal thickening. No mastoid or middle ear effusion. Normal orbits. IMPRESSION: 1. No acute intracranial abnormality. 2. Large area of encephalomalacia in the right MCA  territory. 3. Moderate generalized atrophy and chronic small vessel disease. Electronically Signed   By: KUlyses JarredM.D.   On: 08/15/2022 02:34   DG Abd Portable 1 View  Result Date: 08/15/2022 CLINICAL DATA:  Clearance for MRI EXAM: PORTABLE ABDOMEN - 1 VIEW COMPARISON:  12/03/2021 FINDINGS: Retrievable Celect IVC filter. No additional radiopaque foreign bodies. Nonobstructive bowel gas pattern. No organomegaly or free air. IMPRESSION: Retrievable Celect IVC filter. No acute findings. Electronically Signed   By: KRolm BaptiseM.D.   On: 08/15/2022 01:14   DG Chest Port 1 View  Result Date: 08/14/2022 CLINICAL DATA:  Questionable sepsis - evaluate for abnormality EXAM: PORTABLE CHEST 1 VIEW COMPARISON:  Radiograph 12/03/2021 FINDINGS: Lung volumes are low. Cardiomegaly, stable from prior. Left hemidiaphragm is slightly indistinct, this may be due to airspace disease, atelectasis or small pleural effusion. Bronchovascular crowding versus vascular congestion. No pneumothorax. IMPRESSION: 1. Low lung volumes with cardiomegaly. 2. Indistinct left hemidiaphragm may be due to airspace disease, atelectasis or small pleural effusion. 3. Vascular congestion versus bronchovascular crowding. Electronically Signed   By: MKeith RakeM.D.   On: 08/14/2022 21:17   CT HEAD WO CONTRAST  Result Date: 08/14/2022 CLINICAL DATA:  Seizure EXAM: CT HEAD WITHOUT CONTRAST TECHNIQUE: Contiguous axial images were obtained from the base of the skull through the vertex without intravenous contrast. RADIATION DOSE REDUCTION: This exam was performed according to the departmental dose-optimization program which includes automated exposure control, adjustment of the mA and/or kV according to patient size and/or use of iterative reconstruction technique. COMPARISON:  CT 12/03/2021, MRI 12/03/2021 FINDINGS: Brain: No acute territorial infarction, hemorrhage or intracranial mass. Extensive white matter hypodensity consistent with  chronic small vessel ischemic change. Chronic right MCA infarct with extensive encephalomalacia in the right frontal, parietal, and temporal lobes with ex vacuo dilatation of the ventricle. Chronic infarct left cerebellum. Probable chronic lacunar infarcts in the pons. Chronic lacunar infarcts in the left basal ganglia. Stable ventricle size. Metallic coils in the right temporal lobe. Vascular: No hyperdense vessels. Vertebral and carotid vascular calcification Skull: Normal. Negative for fracture or focal lesion. Sinuses/Orbits: No acute finding. Other: None IMPRESSION: 1. No CT evidence for acute intracranial abnormality. 2. Multifocal chronic infarcts. Chronic small vessel ischemic changes of the white matter. Electronically Signed   By: KDonavan FoilM.D.   On: 08/14/2022 20:52     Discharge Instructions:  You were admitted for seizure-like activity.    Hospital Course: - We treated you with medications to  decrease the chance of seizure-like activity reoccurring. We put in a foley catheter because you were not getting rid of urine correctly. We removed the foley catheter after you began clearing your urine appropriately. We treated your high blood pressure with medications.    Medications: Please start taking: -Levetiracetam (keppra) 500 mg table (twice by mouth daily) to prevent seizures   Please change how you take: -Sacubitril-valsartan (entresto) 49-51 MG (once by mouth daily) for high blood pressure   Please stop taking: -Isosorbide mononitrate (imdur) 60 mg table (once by mouth daily) for your high blood pressure -Magnesium hydroxide (milk of magnesia) (30 mL by mouth) for constipation -Sodium Phosphates (RA saline enema) for constipation -Torsemide (Demadex) 20 mg (by mouth once daily) for your high blood pressure   Please continue taking: -Acetaminophen (tylenol) 500 mg tablet (2 tablets by mouth daily) for pain -Amiodarone (pacerone) 200 mg tablet (once by mouth daily) to  control your heart rate -Aspirin 81 MG chewable tablet (1 by mouth daily) for your history of stroke -Amino Acids-proteins hydrolys (feeding supplement) (30 mL by mouth at morning and at bedtime) for your nutrition -Atorvastatin (Lipitor) 40 mg tablet (once by mouth daily) for your high cholesterol -Baclofen (lioresal) 20 mg tablet (by mouth 3 times daily) for pain -Bisacodyl (dulcolax) 10 mg suppository (once rectally as needed) for constipation -Carvedilol (coreg) 25 mg tablet (once daily) for your high blood pressure -Menthol-Methyl Salicylate ([employer] muscle rub; apply to skin every 12 hours as needed) for pain -Escitalopram (lexapro) 20 mg tablet for your depression and anxiety -Nutritional supplement LIQD (60 mls by mouth at morning, noon, and bedtime) for your nutrition -Feeding supplement (ensure enlive/plus) LIQD (237 mLs by mouth 3 times daily) for your nutrition -Ferrous sulfate 325 Mg table (twice by mouth daily) for your anemia -Gabapentin (neurontin) 300 mg capsule (once by mouth at bedtime) for nerve pain -Hydralazine (apresoline) 100 mg tablet (by mouth 3 times daily) for your high blood pressure -Hydromorphone (dilaudid) (6m by mouth every 2 hours as needed) for pain -Lactulose (chonulac) 10 gm/15 mL solution (15 mLs by mouth twice daily) for constipation -Linaclotide (linzess) 145 mcg capsule (once by mouth daily before breakfast) for constipation -Lorazepam (ativan) 0.5 mg (by mouth every 8 hours as needed) for anxiety -Melatonin 3 mg capsules (two capsules by mouth at bedtime) for sleep -Mirtazapine (remeron) 7.5 mg tablet (once by mouth at bedtime) for sleep -Multiple vitamin (multivitamin) (once by mouth daily) for your overall health -Ondansetron (zofran) 4 mg tablet (by mouth every 8 hours as needed) for nausea and vomiting -Tromaline Salicylate (pain relieving cream) (apply to skin 4 times daily as needed) for pain -Pantoprazole (protonix) 40 mg tablet (once by mouth  daily) for acid reflux -Polyethylene glycol (miralax/glycolax) 17g packet (17g by mouth daily) for constipation -Senna-docusate (senokot) 8.6-50 mg tablet (two tablets by mouth daily) for constipation -Simethicone (Mylicon) 40 mVQ/0.0mL drops (0.6 mL every 6 hours as needed) for flatulence -Camphor-Eucalyptus-Menthol (vicks vaporub) (apply to skin 3 times daily as needed) for congestion   Follow Up: - [facility]   Signed: TStarlyn Skeans MD 08/20/22 11:35 AM Pager: [phone]

## 2022-08-20 NOTE — TOC Transition Note (Signed)
Transition of Care [facility]) - CM/SW Discharge Note   Patient Details  Name: Holly Bradley MRN: [id number] Date of Birth: 01-25-1976  [facility] CM/SW Contact:  Geralynn Ochs, LCSW Phone Number: 08/20/2022, 12:35 PM   Clinical Narrative:   CSW confirmed with MD that patient is stable for return to SNF. CSW sent discharge to [facility], confirmed room ready. Transport scheduled with [facility] for next available.  Nurse to clal report to [phone], Room 210W.    Final next level of care: Skilled Nursing Facility Barriers to Discharge: Barriers Resolved   Patient Goals and CMS Choice Patient states their goals for this hospitalization and ongoing recovery are:: to get back home CMS Medicare.gov Compare Post Acute Care list provided to:: Patient Choice offered to / list presented to : Patient  Discharge Placement              Patient chooses bed at: [facility] Patient to be transferred to facility by: [place] Name of family member notified: Self Patient and family notified of of transfer: 08/20/22  Discharge Plan and Services     Post Acute Care Choice: [place]                               Social Determinants of Health (SDOH) Interventions     Readmission Risk Interventions     No data to display

## 2022-08-24 ENCOUNTER — Emergency Department (HOSPITAL_COMMUNITY): Payer: Medicaid Other

## 2022-08-24 ENCOUNTER — Encounter (HOSPITAL_COMMUNITY): Payer: Self-pay | Admitting: Emergency Medicine

## 2022-08-24 ENCOUNTER — Emergency Department (HOSPITAL_COMMUNITY)
Admission: EM | Admit: 2022-08-24 | Discharge: 2022-08-24 | Disposition: A | Discharge status: Home / Self Care | Payer: Medicaid Other | Attending: Emergency Medicine | Admitting: Emergency Medicine

## 2022-08-24 DIAGNOSIS — I110 Hypertensive heart disease with heart failure: Secondary | ICD-10-CM | POA: Diagnosis not present

## 2022-08-24 DIAGNOSIS — G4489 Other headache syndrome: Secondary | ICD-10-CM | POA: Diagnosis not present

## 2022-08-24 DIAGNOSIS — R519 Headache, unspecified: Secondary | ICD-10-CM | POA: Diagnosis present

## 2022-08-24 DIAGNOSIS — I5020 Unspecified systolic (congestive) heart failure: Secondary | ICD-10-CM | POA: Insufficient documentation

## 2022-08-24 DIAGNOSIS — Z79899 Other long term (current) drug therapy: Secondary | ICD-10-CM | POA: Insufficient documentation

## 2022-08-24 DIAGNOSIS — Z7982 Long term (current) use of aspirin: Secondary | ICD-10-CM | POA: Diagnosis not present

## 2022-08-24 LAB — COMPREHENSIVE METABOLIC PANEL
ALT: 27 U/L (ref 0–44)
AST: 27 U/L (ref 15–41)
Albumin: 3.9 g/dL (ref 3.5–5.0)
Alkaline Phosphatase: 111 U/L (ref 38–126)
Anion gap: 13 (ref 5–15)
BUN: 30 mg/dL — ABNORMAL HIGH (ref 6–20)
CO2: 26 mmol/L (ref 22–32)
Calcium: 10.9 mg/dL — ABNORMAL HIGH (ref 8.9–10.3)
Chloride: 102 mmol/L (ref 98–111)
Creatinine, Ser: 1.31 mg/dL — ABNORMAL HIGH (ref 0.44–1.00)
GFR, Estimated: 50 mL/min — ABNORMAL LOW (ref >60)
Glucose, Bld: 84 mg/dL (ref 70–99)
Potassium: 4.6 mmol/L (ref 3.5–5.1)
Sodium: 141 mmol/L (ref 135–145)
Total Bilirubin: 0.6 mg/dL (ref 0.3–1.2)
Total Protein: 7.8 g/dL (ref 6.5–8.1)

## 2022-08-24 LAB — CBC WITH DIFFERENTIAL/PLATELET
Abs Immature Granulocytes: 0.04 10*3/uL (ref 0.00–0.07)
Basophils Absolute: 0 10*3/uL (ref 0.0–0.1)
Basophils Relative: 0 %
Eosinophils Absolute: 0.1 10*3/uL (ref 0.0–0.5)
Eosinophils Relative: 1 %
HCT: 35.7 % — ABNORMAL LOW (ref 36.0–46.0)
Hemoglobin: 11.4 g/dL — ABNORMAL LOW (ref 12.0–15.0)
Immature Granulocytes: 1 %
Lymphocytes Relative: 14 %
Lymphs Abs: 1.3 10*3/uL (ref 0.7–4.0)
MCH: 31.3 pg (ref 26.0–34.0)
MCHC: 31.9 g/dL (ref 30.0–36.0)
MCV: 98.1 fL (ref 80.0–100.0)
Monocytes Absolute: 0.9 10*3/uL (ref 0.1–1.0)
Monocytes Relative: 10 %
Neutro Abs: 6.5 10*3/uL (ref 1.7–7.7)
Neutrophils Relative %: 74 %
Platelets: 176 10*3/uL (ref 150–400)
RBC: 3.64 MIL/uL — ABNORMAL LOW (ref 3.87–5.11)
RDW: 14.3 % (ref 11.5–15.5)
WBC: 8.8 10*3/uL (ref 4.0–10.5)
nRBC: 0 % (ref 0.0–0.2)

## 2022-08-24 LAB — MAGNESIUM: Magnesium: 2.1 mg/dL (ref 1.7–2.4)

## 2022-08-24 MED ORDER — LACTATED RINGERS IV BOLUS
500.0000 mL | Freq: Once | INTRAVENOUS | Status: AC
  Administered 2022-08-24: 500 mL via INTRAVENOUS

## 2022-08-24 MED ORDER — KETOROLAC TROMETHAMINE 15 MG/ML IJ SOLN
15.0000 mg | Freq: Once | INTRAMUSCULAR | Status: AC
  Administered 2022-08-24: 15 mg via INTRAVENOUS
  Filled 2022-08-24: qty 1

## 2022-08-24 MED ORDER — ENSURE ENLIVE PO LIQD
237.0000 mL | Freq: Once | ORAL | Status: AC
  Administered 2022-08-24: 237 mL via ORAL
  Filled 2022-08-24: qty 237

## 2022-08-24 MED ORDER — ACETAMINOPHEN 500 MG PO TABS
1000.0000 mg | ORAL_TABLET | Freq: Once | ORAL | Status: AC
Start: 2022-08-24 — End: 2022-08-24
  Administered 2022-08-24: 1000 mg via ORAL
  Filled 2022-08-24: qty 2

## 2022-08-24 MED ORDER — PROCHLORPERAZINE EDISYLATE 10 MG/2ML IJ SOLN
5.0000 mg | Freq: Once | INTRAMUSCULAR | Status: AC
Start: 2022-08-24 — End: 2022-08-24
  Administered 2022-08-24: 5 mg via INTRAVENOUS
  Filled 2022-08-24: qty 2

## 2022-08-24 MED ORDER — HYDRALAZINE HCL 25 MG PO TABS
100.0000 mg | ORAL_TABLET | Freq: Once | ORAL | Status: AC
  Administered 2022-08-24: 100 mg via ORAL
  Filled 2022-08-24: qty 4

## 2022-08-24 NOTE — ED Notes (Signed)
Patient repositioned and moved up in the bed.

## 2022-08-24 NOTE — ED Notes (Signed)
Discharge instructions reviewed with patient . Patient verbalized understanding of instructions. Follow-up care and medications were reviewed. Patient left in stretcher with [facility] back to [facility].

## 2022-08-24 NOTE — ED Notes (Signed)
IV team at bedside 

## 2022-08-24 NOTE — ED Provider Notes (Signed)
[facility] EMERGENCY DEPARTMENT Provider Note   CSN: [id number] Arrival date & time: 08/24/22  1435     History  Chief Complaint  Patient presents with   Headache    Holly Bradley is a 46 y.o. female. Presenting with right frontal headache that started at 8 AM this morning.  Also reports intermittent double vision and blurry vision.  States this is worse than normal headaches and located in right frontal region, and they are usually in right posterior region. Has had persistent nausea, with one episode of emesis.  Denies abdominal pain or fever.  Denies any recent falls.  Reports compliance with her medications.  Has baseline left-sided deficits from prior stroke and is not able to ambulate.  Denies any known history of recent seizures since last admission. HPI     Home Medications Prior to Admission medications   Medication Sig Start Date End Date Taking? Authorizing Provider  acetaminophen (TYLENOL) 500 MG tablet Take 2 tablets (1,000 mg total) by mouth in the morning, at noon, and at bedtime. 12/12/21  Yes Wayland Denis, MD  amiodarone (PACERONE) 200 MG tablet Take 200 mg by mouth daily. 02/02/21  Yes [provider]  aspirin 81 MG chewable tablet Chew 1 tablet (81 mg total) by mouth daily. 07/04/21  Yes Samella Parr, NP  atorvastatin (LIPITOR) 40 MG tablet Take 1 tablet (40 mg total) by mouth daily. 07/04/21  Yes Samella Parr, NP  baclofen (LIORESAL) 20 MG tablet Take 1 tablet (20 mg total) by mouth 3 (three) times daily. 10/27/21  Yes France Ravens, MD  bisacodyl (DULCOLAX) 10 MG suppository Place 10 mg rectally as needed (if constipation is not relieved by MOM).   Yes [provider]  Camphor-Eucalyptus-Menthol (VICKS VAPORUB EX) Apply 1 Application topically 3 (three) times daily as needed (congestion).   Yes [provider]  carvedilol (COREG) 25 MG tablet Take 1 tablet (25 mg total) by mouth 2 (two) times daily with a meal.  07/03/21  Yes Samella Parr, NP  cloNIDine (CATAPRES) 0.1 MG tablet Take 1 tablet (0.1 mg total) by mouth 3 (three) times daily. 07/03/21  Yes Samella Parr, NP  escitalopram (LEXAPRO) 20 MG tablet Take 20 mg by mouth daily.   Yes [provider]  ferrous sulfate 325 (65 FE) MG tablet Take 1 tablet (325 mg total) by mouth 2 (two) times daily with a meal. 02/20/21  Yes Lyda Jester M, PA-C  gabapentin (NEURONTIN) 300 MG capsule Take 300 mg by mouth at bedtime.   Yes [provider]  hydrALAZINE (APRESOLINE) 100 MG tablet Take 1 tablet (100 mg total) by mouth 3 (three) times daily. 03/02/21  Yes Bensimhon, Shaune Pascal, MD  HYDROmorphone HCl (DILAUDID) 1 MG/ML LIQD Take 1 mL (1 mg total) by mouth every 2 (two) hours as needed for severe pain or moderate pain (distress, shortness of breath, RR >25, dyspnea, premedicating prior to turning/moving **Comfort care**). 12/12/21  Yes Wayland Denis, MD  lactulose (CHRONULAC) 10 GM/15ML solution Take 15 mLs (10 g total) by mouth 2 (two) times daily. 07/03/21  Yes Samella Parr, NP  levETIRAcetam (KEPPRA) 500 MG tablet Take 1 tablet (500 mg total) by mouth 2 (two) times daily. 08/20/22  Yes Mapp, Tavien, MD  linaclotide (LINZESS) 145 MCG CAPS capsule Take 145 mcg by mouth daily before breakfast.   Yes [provider]  LORazepam (ATIVAN) 0.5 MG tablet Take 0.5 mg by mouth every 8 (eight) hours as needed  for anxiety (restlessness).   Yes [provider]  magnesium hydroxide (MILK OF MAGNESIA) 400 MG/5ML suspension Take 30 mLs by mouth daily as needed for mild constipation.   Yes [provider]  Melatonin 3 MG CAPS Take 6 mg by mouth at bedtime. 08/08/22  Yes [provider]  Menthol-Methyl Salicylate ([employer] MUSCLE RUB) 10-15 % CREA Apply 1 Application topically every 12 (twelve) hours as needed (pain).   Yes [provider]  mirtazapine (REMERON) 7.5 MG tablet Take 7.5 mg by mouth at bedtime.   Yes  [provider]  Multiple Vitamin (MULTIVITAMIN WITH MINERALS) TABS tablet Take 1 tablet by mouth daily. 07/04/21  Yes Samella Parr, NP  ondansetron (ZOFRAN) 4 MG tablet Take 4 mg by mouth every 8 (eight) hours as needed for nausea or vomiting.   Yes [provider]  pantoprazole (PROTONIX) 40 MG tablet Take 40 mg by mouth daily.   Yes [provider]  polyethylene glycol (MIRALAX / GLYCOLAX) 17 g packet Take 17 g by mouth daily. 10/27/21  Yes France Ravens, MD  sacubitril-valsartan (ENTRESTO) 49-51 MG Take 1 tablet by mouth daily.   Yes [provider]  senna-docusate (SENOKOT-S) 8.6-50 MG tablet Take 2 tablets by mouth 2 (two) times daily.   Yes [provider]  simethicone (MYLICON) 40 MC/9.4BS drops Take 0.6 mLs (40 mg total) by mouth every 6 (six) hours as needed for flatulence. 07/03/21  Yes Samella Parr, NP  trolamine salicylate (PAIN RELIEVING) 10 % cream Apply 1 Application topically 4 (four) times daily as needed (pain).   Yes [provider]  Amino Acids-Protein Hydrolys (FEEDING SUPPLEMENT, PRO-STAT SUGAR FREE 64,) LIQD Take 30 mLs by mouth in the morning and at bedtime. Patient not taking: Reported on 08/15/2022    [provider]  feeding supplement (ENSURE ENLIVE / ENSURE PLUS) LIQD Take 237 mLs by mouth 3 (three) times daily between meals. Patient not taking: Reported on 08/15/2022 07/03/21   Samella Parr, NP      Allergies    Patient has no known allergies.    Review of Systems   Review of Systems  Constitutional:  Negative for chills and fever.  HENT:  Negative for ear pain and sore throat.   Eyes:  Positive for visual disturbance. Negative for pain.  Respiratory:  Negative for cough and shortness of breath.   Cardiovascular:  Negative for chest pain and palpitations.  Gastrointestinal:  Positive for nausea and vomiting. Negative for abdominal pain.  Genitourinary:  Negative for dysuria and hematuria.   Musculoskeletal:  Negative for arthralgias and back pain.  Skin:  Negative for color change and rash.  Neurological:  Positive for headaches. Negative for seizures and syncope.  All other systems reviewed and are negative.   Physical Exam Updated Vital Signs BP 132/63   Pulse 92   Temp 98.9 F (37.2 C) (Oral)   Resp 16   Ht '5\' 5"'$  (1.651 m)   Wt 64 kg   SpO2 100%   BMI 23.48 kg/m  Physical Exam Vitals and nursing note reviewed.  Constitutional:      General: She is not in acute distress.    Appearance: She is well-developed.  HENT:     Head: Normocephalic and atraumatic.  Eyes:     Conjunctiva/sclera: Conjunctivae normal.     Pupils: Pupils are equal, round, and reactive to light.     Comments: Reported double vision in right eye, normal in left  Cardiovascular:     Rate and Rhythm: Normal rate and regular rhythm.     Heart sounds: No murmur heard. Pulmonary:     Effort: Pulmonary effort is normal. No respiratory distress.     Breath sounds: Normal breath sounds.  Abdominal:     Palpations: Abdomen is soft.     Tenderness: There is no abdominal tenderness.  Musculoskeletal:        General: No swelling.     Cervical back: Neck supple.  Skin:    General: Skin is warm and dry.     Capillary Refill: Capillary refill takes less than 2 seconds.  Neurological:     Mental Status: She is alert and oriented to person, place, and time.     Comments: Decreased sensation on left face and extremities, left facial droop, left upper and lower extremity hemiparesis (baseline)   Psychiatric:        Mood and Affect: Mood normal.     ED Results / Procedures / Treatments   Labs (all labs ordered are listed, but only abnormal results are displayed) Labs Reviewed  CBC WITH DIFFERENTIAL/PLATELET - Abnormal; Notable for the following components:      Result Value   RBC 3.64 (*)    Hemoglobin 11.4 (*)    HCT 35.7 (*)    All other components within normal limits  COMPREHENSIVE  METABOLIC PANEL - Abnormal; Notable for the following components:   BUN 30 (*)    Creatinine, Ser 1.31 (*)    Calcium 10.9 (*)    GFR, Estimated 50 (*)    All other components within normal limits  MAGNESIUM    EKG None  Radiology CT Head Wo Contrast  Result Date: 08/24/2022 CLINICAL DATA:  Headaches, history of CVA EXAM: CT HEAD WITHOUT CONTRAST TECHNIQUE: Contiguous axial images were obtained from the base of the skull through the vertex without intravenous contrast. RADIATION DOSE REDUCTION: This exam was performed according to the departmental dose-optimization program which includes automated exposure control, adjustment of the mA and/or kV according to patient size and/or use of iterative reconstruction technique. COMPARISON:  08/14/2022 FINDINGS: Brain: No acute intracranial findings are seen. There are no signs of bleeding within the cranium. Large area encephalomalacia, old infarct is seen in right MCA distribution. There are smaller infarcts in left cerebellum. Cortical sulci are prominent. There is decreased density in periventricular and subcortical white matter. Small linear metallic density seen in the right temporoparietal cortex has not changed. Vascular: Scattered arterial calcifications are seen. Skull: No fracture is seen in the calvarium. Sinuses/Orbits: Unremarkable. Other: None. IMPRESSION: No acute intracranial findings are seen in noncontrast CT brain. Old infarcts are seen in right cerebrum and left cerebellum. Atrophy. Small-vessel disease. Electronically Signed   By: Elmer Picker M.D.   On: 08/24/2022 16:46    Procedures Procedures    Medications Ordered in ED Medications  acetaminophen (TYLENOL) tablet 1,000 mg (1,000 mg Oral Given 08/24/22 1809)  prochlorperazine (COMPAZINE) injection 5 mg (5 mg Intravenous Given 08/24/22 1725)  feeding supplement (ENSURE ENLIVE / ENSURE PLUS) liquid 237 mL (237 mLs Oral Given 08/24/22 1806)  ketorolac (TORADOL) 15 MG/ML  injection 15 mg (15 mg Intravenous Given 08/24/22 1851)  hydrALAZINE (APRESOLINE) tablet 100 mg (100 mg Oral Given 08/24/22 1853)  lactated ringers bolus 500 mL (0 mLs Intravenous Stopped 08/24/22 2007)    ED Course/ Medical Decision Making/ A&P  Medical Decision Making Amount and/or Complexity of Data Reviewed Labs: ordered. Radiology: ordered.  Risk OTC drugs. Prescription drug management.  46 year old female with PMH seizure disorder, poorly controlled HTN, HFrEF, CVA with residual left sided deficits presenting with headache, double vision, and nausea that started this AM.  Discharge 8/29, 3d ago, after admission for 6 days for seizure and AKI.  At that time she had a reassuring CT and MRI, that redemonstrated remote infarctions.  Differential diagnosis includes CVA, intracranial hemorrhage, TIA, tension headache, migraine, electrolyte abnormalities, AKI.  Due to new headache and vision changes, CT obtained to evaluate for intracranial hemorrhage.  CT reviewed and found to be reassuring, no signs of intracranial hemorrhage or new large infarction.  Labs reviewed and reassuring. Cr near baseline at 1.3. No leukocytosis, electrolyte abnormalities, or significant anemia.   On evaluation, patient initially had improvement of her headache, however it then began to return and have associated ringing in her ears.  She was hypertensive at this time.  Patient given home antihypertensive hydralazine, fluid bolus, and Toradol. On reevaluation, blood pressure has improved. BP 132/63. Repeat neurologic exam continued to be at her baseline.  Today she has had a reassuring work-up.  She states she would like to stay in the hospital for the remainder of the weekend because she does not like her weekend care at her facility. States that if it was Monday then she would feel comfortable with discharge. I had extensive discussion about patients reassuring work up today in ED and that I  did not see emergency cause of her headache or an indication for admission. She voiced understanding of this. I do suspect patient's dismay with her care at her facility has contributed to her symptoms today.  Strict return precautions given.         Final Clinical Impression(s) / ED Diagnoses Final diagnoses:  Other headache syndrome    Rx / DC Orders ED Discharge Orders     None         Rosine Abe, MD 08/24/22 [id number]    Davonna Belling, MD 08/25/22 [phone]

## 2022-08-24 NOTE — Discharge Instructions (Signed)
You had a reassuring work up for your headache today. Your CT of your head did not show any new emergent findings. Please continue your home medications. Return to ED as needed.

## 2022-08-24 NOTE — ED Triage Notes (Signed)
Pt arrives via EMS from [facility] with hx of CVA and left side paralysis. Chronic hx of abd pain and nausea. Pt began having double vision and headache at 8 am today. Pt given zofran before EMS arrival.

## 2022-08-24 NOTE — ED Notes (Signed)
Patient states she is having ringing in the ears and this is a new symptom. Informed MD. Will continue to monitor

## 2022-08-24 NOTE — ED Notes (Signed)
Provided incontinent care, placed purwick and repositioned patient on right side

## 2023-07-06 ENCOUNTER — Emergency Department (HOSPITAL_COMMUNITY)
Admission: EM | Admit: 2023-07-06 | Discharge: 2023-07-07 | Disposition: A | Discharge status: Rehabilitation Facility | Payer: Medicaid Other | Attending: Emergency Medicine | Admitting: Emergency Medicine

## 2023-07-06 ENCOUNTER — Emergency Department (HOSPITAL_COMMUNITY): Payer: Medicaid Other

## 2023-07-06 ENCOUNTER — Other Ambulatory Visit: Payer: Self-pay

## 2023-07-06 DIAGNOSIS — N200 Calculus of kidney: Secondary | ICD-10-CM | POA: Diagnosis not present

## 2023-07-06 DIAGNOSIS — K8689 Other specified diseases of pancreas: Secondary | ICD-10-CM | POA: Insufficient documentation

## 2023-07-06 DIAGNOSIS — Z7982 Long term (current) use of aspirin: Secondary | ICD-10-CM | POA: Diagnosis not present

## 2023-07-06 DIAGNOSIS — D259 Leiomyoma of uterus, unspecified: Secondary | ICD-10-CM | POA: Diagnosis not present

## 2023-07-06 DIAGNOSIS — Z20822 Contact with and (suspected) exposure to covid-19: Secondary | ICD-10-CM | POA: Insufficient documentation

## 2023-07-06 DIAGNOSIS — R030 Elevated blood-pressure reading, without diagnosis of hypertension: Secondary | ICD-10-CM

## 2023-07-06 DIAGNOSIS — I10 Essential (primary) hypertension: Secondary | ICD-10-CM

## 2023-07-06 DIAGNOSIS — R051 Acute cough: Secondary | ICD-10-CM

## 2023-07-06 DIAGNOSIS — R509 Fever, unspecified: Secondary | ICD-10-CM | POA: Diagnosis present

## 2023-07-06 DIAGNOSIS — K869 Disease of pancreas, unspecified: Secondary | ICD-10-CM

## 2023-07-06 DIAGNOSIS — R300 Dysuria: Secondary | ICD-10-CM

## 2023-07-06 DIAGNOSIS — Z79899 Other long term (current) drug therapy: Secondary | ICD-10-CM | POA: Insufficient documentation

## 2023-07-06 DIAGNOSIS — R110 Nausea: Secondary | ICD-10-CM

## 2023-07-06 LAB — CBC
HCT: 38.6 % (ref 36.0–46.0)
Hemoglobin: 12.3 g/dL (ref 12.0–15.0)
MCH: 31.1 pg (ref 26.0–34.0)
MCHC: 31.9 g/dL (ref 30.0–36.0)
MCV: 97.7 fL (ref 80.0–100.0)
Platelets: 225 10*3/uL (ref 150–400)
RBC: 3.95 MIL/uL (ref 3.87–5.11)
RDW: 12.7 % (ref 11.5–15.5)
WBC: 10.5 10*3/uL (ref 4.0–10.5)
nRBC: 0 % (ref 0.0–0.2)

## 2023-07-06 LAB — LACTIC ACID, PLASMA: Lactic Acid, Venous: 0.8 mmol/L (ref 0.5–1.9)

## 2023-07-06 MED ORDER — ACETAMINOPHEN 160 MG/5ML PO SOLN
650.0000 mg | Freq: Once | ORAL | Status: AC
  Administered 2023-07-06: 650 mg via ORAL
  Filled 2023-07-06: qty 20.3

## 2023-07-06 NOTE — ED Triage Notes (Signed)
Pt arrives EMS from [facility] with reports of dysuria and nausea for the last couple days. Pt was given 8mg  Zofran and 650 tylenol around 8pm tonight. Pt has had hs of stroke with left side deficits.

## 2023-07-06 NOTE — ED Provider Notes (Signed)
[facility] AT [facility] Provider Note   CSN: [id number] Arrival date & time: 07/06/23  2220     History  Chief Complaint  Patient presents with   Nausea   Dysuria    Pari Lombard is a 47 y.o. female.  Pt with hx cva w left hemiparesis, presents with nausea, general malaise, mild dysuria, fever 100.6. symptoms acute onset in past day. No specific known ill contacts. Occasional non prod cough. No sore throat. No sinus drainage or pain. No severe headaches. No neck pain or stiffness.  No chest pain or discomfort. No sob or unusual doe. No abd pain or vomiting/diarrhea. No extremity redness, pain or swelling.   The history is provided by the patient, medical records and the EMS personnel.  Dysuria Associated symptoms: fever and nausea   Associated symptoms: no abdominal pain, no flank pain and no vaginal discharge        Home Medications Prior to Admission medications   Medication Sig Start Date End Date Taking? Authorizing Provider  acetaminophen (TYLENOL) 500 MG tablet Take 2 tablets (1,000 mg total) by mouth in the morning, at noon, and at bedtime. 12/12/21  Yes Ellison Carwin, MD  amiodarone (PACERONE) 200 MG tablet Take 200 mg by mouth daily. 02/02/21  Yes [provider]  aspirin 81 MG chewable tablet Chew 1 tablet (81 mg total) by mouth daily. 07/04/21  Yes Russella Dar, NP  atorvastatin (LIPITOR) 40 MG tablet Take 1 tablet (40 mg total) by mouth daily. 07/04/21  Yes Russella Dar, NP  azithromycin (ZITHROMAX Z-PAK) 250 MG tablet Take 1 tablet (250 mg total) by mouth daily for 4 days. 07/07/23 07/11/23 Yes Cathren Laine, MD  baclofen (LIORESAL) 20 MG tablet Take 1 tablet (20 mg total) by mouth 3 (three) times daily. 10/27/21  Yes Park Pope, MD  Camphor-Eucalyptus-Menthol [facility] VAPORUB EX) Apply 1 Application topically 3 (three) times daily as needed (congestion).   Yes [provider]  carvedilol (COREG) 25 MG tablet  Take 1 tablet (25 mg total) by mouth 2 (two) times daily with a meal. 07/03/21  Yes Russella Dar, NP  cefdinir (OMNICEF) 300 MG capsule Take 1 capsule (300 mg total) by mouth 2 (two) times daily. 07/07/23  Yes Cathren Laine, MD  cloNIDine (CATAPRES) 0.1 MG tablet Take 1 tablet (0.1 mg total) by mouth 3 (three) times daily. 07/03/21  Yes Russella Dar, NP  donepezil (ARICEPT) 5 MG tablet Take 5 mg by mouth daily.   Yes [provider]  escitalopram (LEXAPRO) 20 MG tablet Take 20 mg by mouth daily.   Yes [provider]  ferrous sulfate 325 (65 FE) MG tablet Take 1 tablet (325 mg total) by mouth 2 (two) times daily with a meal. 02/20/21  Yes Robbie Lis M, PA-C  gabapentin (NEURONTIN) 300 MG capsule Take 300 mg by mouth at bedtime.   Yes [provider]  hydrALAZINE (APRESOLINE) 100 MG tablet Take 1 tablet (100 mg total) by mouth 3 (three) times daily. 03/02/21  Yes Bensimhon, Bevelyn Buckles, MD  lactulose (CHRONULAC) 10 GM/15ML solution Take 15 mLs (10 g total) by mouth 2 (two) times daily. 07/03/21  Yes Russella Dar, NP  levETIRAcetam (KEPPRA) 500 MG tablet Take 1 tablet (500 mg total) by mouth 2 (two) times daily. 08/20/22  Yes Mapp, Tavien, MD  lidocaine (ASPERCREME LIDOCAINE) 4 % Place 1 patch onto the skin daily. To left upper chest and left hip and remove 12  hours later   Yes [provider]  lidocaine 4 % Place 1 patch onto the skin daily. Left hip   Yes [provider]  linaclotide (LINZESS) 145 MCG CAPS capsule Take 145 mcg by mouth daily before breakfast.   Yes [provider]  Melatonin 3 MG CAPS Take 6 mg by mouth at bedtime. 08/08/22  Yes [provider]  mirtazapine (REMERON) 7.5 MG tablet Take 7.5 mg by mouth at bedtime.   Yes [provider]  Multiple Vitamin (MULTIVITAMIN WITH MINERALS) TABS tablet Take 1 tablet by mouth daily. 07/04/21  Yes Russella Dar, NP  ondansetron (ZOFRAN) 4 MG tablet Take 4 mg by  mouth every 8 (eight) hours as needed for nausea or vomiting.   Yes [provider]  oxyCODONE (OXY IR/ROXICODONE) 5 MG immediate release tablet Take 5 mg by mouth 2 (two) times daily.   Yes [provider]  pantoprazole (PROTONIX) 40 MG tablet Take 40 mg by mouth daily.   Yes [provider]  polyethylene glycol (MIRALAX / GLYCOLAX) 17 g packet Take 17 g by mouth daily. Patient taking differently: Take 17 g by mouth every evening. 6pm 10/27/21  Yes Park Pope, MD  sacubitril-valsartan (ENTRESTO) 49-51 MG Take 1 tablet by mouth at bedtime.   Yes [provider]  senna-docusate (SENOKOT-S) 8.6-50 MG tablet Take 2 tablets by mouth 2 (two) times daily.   Yes [provider]  simethicone (MYLICON) 40 MG/0.6ML drops Take 0.6 mLs (40 mg total) by mouth every 6 (six) hours as needed for flatulence. 07/03/21  Yes Russella Dar, NP  Vitamin D, Ergocalciferol, (DRISDOL) 1.25 MG (50000 UNIT) CAPS capsule Take 50,000 Units by mouth every 7 (seven) days. mondays   Yes [provider]  LORazepam (ATIVAN) 0.5 MG tablet Take 0.5 mg by mouth every 8 (eight) hours as needed for anxiety (restlessness).    [provider]  magnesium hydroxide (MILK OF MAGNESIA) 400 MG/5ML suspension Take 30 mLs by mouth daily as needed for mild constipation.    [provider]  trolamine salicylate (PAIN RELIEVING) 10 % cream Apply 1 Application topically 4 (four) times daily as needed (pain).    [provider]      Allergies    Patient has no known allergies.    Review of Systems   Review of Systems  Constitutional:  Positive for fever.  HENT:  Negative for congestion, ear pain, sinus pain and sore throat.   Eyes:  Negative for pain and redness.  Respiratory:  Positive for cough. Negative for shortness of breath.   Cardiovascular:  Negative for chest pain and leg swelling.  Gastrointestinal:  Positive for nausea. Negative for abdominal pain and  diarrhea.  Genitourinary:  Positive for dysuria. Negative for flank pain, vaginal bleeding and vaginal discharge.  Musculoskeletal:  Positive for myalgias. Negative for neck pain and neck stiffness.  Skin:  Negative for rash.  Neurological:  Negative for headaches.  Hematological:  Does not bruise/bleed easily.  Psychiatric/Behavioral:  Negative for confusion.     Physical Exam Updated Vital Signs BP (!) 183/93   Pulse 91   Temp 97.8 F (36.6 C) (Oral)   Resp 18   SpO2 92%  Physical Exam Vitals and nursing note reviewed.  Constitutional:      Appearance: Normal appearance. She is well-developed.  HENT:     Head: Atraumatic.     Comments: No sinus or temporal tenderness.     Right Ear: Tympanic membrane  normal.     Left Ear: Tympanic membrane normal.     Ears:     Comments: No ear or mastoid pain or tenderness.     Nose: Nose normal. No congestion or rhinorrhea.     Mouth/Throat:     Mouth: Mucous membranes are moist.     Pharynx: Oropharynx is clear. No oropharyngeal exudate or posterior oropharyngeal erythema.  Eyes:     General: No scleral icterus.    Conjunctiva/sclera: Conjunctivae normal.     Pupils: Pupils are equal, round, and reactive to light.  Neck:     Vascular: No carotid bruit.     Trachea: No tracheal deviation.     Comments: No stiffness or rigidity.  Cardiovascular:     Rate and Rhythm: Normal rate and regular rhythm.     Pulses: Normal pulses.     Heart sounds: Normal heart sounds. No murmur heard.    No friction rub. No gallop.  Pulmonary:     Effort: Pulmonary effort is normal. No respiratory distress.     Comments: Non prod cough. Upper resp congestion.  Abdominal:     General: Bowel sounds are normal. There is no distension.     Palpations: Abdomen is soft.     Tenderness: There is no abdominal tenderness. There is no guarding.  Genitourinary:    Comments: No cva tenderness.  Musculoskeletal:        General: No swelling or tenderness.      Cervical back: Normal range of motion and neck supple. No rigidity or tenderness. No muscular tenderness.     Right lower leg: No edema.     Left lower leg: No edema.  Skin:    General: Skin is warm and dry.     Findings: No rash.  Neurological:     Mental Status: She is alert.     Comments: Alert, answers questions appropriately. Left hemiparesis.   Psychiatric:        Mood and Affect: Mood normal.     ED Results / Procedures / Treatments   Labs (all labs ordered are listed, but only abnormal results are displayed) Results for orders placed or performed during the hospital encounter of 07/06/23  SARS Coronavirus 2 by RT PCR (hospital order, performed in [facility] hospital lab) *cepheid single result test* Anterior Nasal Swab   Specimen: Anterior Nasal Swab  Result Value Ref Range   SARS Coronavirus 2 by RT PCR NEGATIVE NEGATIVE  CBC  Result Value Ref Range   WBC 10.5 4.0 - 10.5 K/uL   RBC 3.95 3.87 - 5.11 MIL/uL   Hemoglobin 12.3 12.0 - 15.0 g/dL   HCT 93.8 18.2 - 99.3 %   MCV 97.7 80.0 - 100.0 fL   MCH 31.1 26.0 - 34.0 pg   MCHC 31.9 30.0 - 36.0 g/dL   RDW 71.6 96.7 - 89.3 %   Platelets 225 150 - 400 K/uL   nRBC 0.0 0.0 - 0.2 %  Comprehensive metabolic panel  Result Value Ref Range   Sodium 144 135 - 145 mmol/L   Potassium 4.0 3.5 - 5.1 mmol/L   Chloride 112 (H) 98 - 111 mmol/L   CO2 26 22 - 32 mmol/L   Glucose, Bld 119 (H) 70 - 99 mg/dL   BUN 30 (H) 6 - 20 mg/dL   Creatinine, Ser 8.10 (H) 0.44 - 1.00 mg/dL   Calcium 9.6 8.9 - 17.5 mg/dL   Total Protein 8.2 (H) 6.5 - 8.1 g/dL  Albumin 4.0 3.5 - 5.0 g/dL   AST 24 15 - 41 U/L   ALT 22 0 - 44 U/L   Alkaline Phosphatase 107 38 - 126 U/L   Total Bilirubin 0.6 0.3 - 1.2 mg/dL   GFR, Estimated 48 (L) >60 mL/min   Anion gap 6 5 - 15  Urinalysis, Routine w reflex microscopic -Urine, Catheterized  Result Value Ref Range   Color, Urine YELLOW YELLOW   APPearance HAZY (A) CLEAR   Specific Gravity, Urine 1.018 1.005 -  1.030   pH 5.0 5.0 - 8.0   Glucose, UA NEGATIVE NEGATIVE mg/dL   Hgb urine dipstick NEGATIVE NEGATIVE   Bilirubin Urine NEGATIVE NEGATIVE   Ketones, ur NEGATIVE NEGATIVE mg/dL   Protein, ur 30 (A) NEGATIVE mg/dL   Nitrite NEGATIVE NEGATIVE   Leukocytes,Ua NEGATIVE NEGATIVE   RBC / HPF 0-5 0 - 5 RBC/hpf   WBC, UA 0-5 0 - 5 WBC/hpf   Bacteria, UA FEW (A) NONE SEEN   Squamous Epithelial / HPF 0-5 0 - 5 /HPF   Mucus PRESENT    Budding Yeast PRESENT   Lactic acid, plasma  Result Value Ref Range   Lactic Acid, Venous 0.8 0.5 - 1.9 mmol/L     EKG None  Radiology CT ABDOMEN PELVIS W CONTRAST  Result Date: 07/07/2023 CLINICAL DATA:  Abdominal pain. EXAM: CT ABDOMEN AND PELVIS WITH CONTRAST TECHNIQUE: Multidetector CT imaging of the abdomen and pelvis was performed using the standard protocol following bolus administration of intravenous contrast. RADIATION DOSE REDUCTION: This exam was performed according to the departmental dose-optimization program which includes automated exposure control, adjustment of the mA and/or kV according to patient size and/or use of iterative reconstruction technique. CONTRAST:  80mL OMNIPAQUE IOHEXOL 300 MG/ML  SOLN COMPARISON:  CT abdomen and pelvis 12/11/2021 FINDINGS: Lower chest: No acute abnormality. Hepatobiliary: No focal liver abnormality is seen. No gallstones, gallbladder wall thickening, or biliary dilatation. Pancreas: There is a focal hypodense area in the tail of the pancreas measuring 16 x 10 mm image 3/20, indeterminate. This is not definitely seen on prior. The pancreas is otherwise within normal limits. Spleen: Normal in size without focal abnormality. Adrenals/Urinary Tract: There is scarring in the left kidney. There are punctate nonobstructing right renal calculi. There is no hydronephrosis or perinephric stranding in either kidney. The adrenal glands and bladder are within normal limits. Stomach/Bowel: The appendix is slightly prominent in size  measuring 8 mm. There is no surrounding inflammation. There is no bowel obstruction, pneumatosis or free air. There is a large amount of stool throughout the colon. The stomach is within normal limits. Vascular/Lymphatic: Aortic atherosclerosis. No enlarged abdominal or pelvic lymph nodes. Infrarenal IVC filter is present. Reproductive: Lobulated heterogeneous mildly enlarged uterus present compatible with fibroid change. The ovaries are not well delineated on this study. Other: No abdominal wall hernia or abnormality. No abdominopelvic ascites. Musculoskeletal: No acute or significant osseous findings. IMPRESSION: 1. The appendix is slightly prominent in size measuring 8 mm, but there is no surrounding inflammation. Correlate clinically for early appendicitis. 2. Hypodense lesion in the tail of the pancreas measuring 16 x 10 mm, indeterminate. Recommend follow-up MRI of the pancreas. 3. Nonobstructing right renal calculi. 4. Fibroid uterus. Aortic Atherosclerosis (ICD10-I70.0). Electronically Signed   By: Darliss Cheney M.D.   On: 07/07/2023 02:57   DG Chest Port 1 View  Result Date: 07/06/2023 CLINICAL DATA:  Fever EXAM: PORTABLE CHEST 1 VIEW COMPARISON:  08/14/2022 FINDINGS: Cardiomegaly with vascular congestion  and mild interstitial edema. Difficult to exclude atelectasis or developing infiltrate in the left retrocardiac space. No pneumothorax IMPRESSION: Cardiomegaly with vascular congestion and mild interstitial edema. Difficult to exclude atelectasis or developing infiltrate in the left retrocardiac space. Electronically Signed   By: Jasmine Pang M.D.   On: 07/06/2023 23:53    Procedures Procedures    Medications Ordered in ED Medications  acetaminophen (TYLENOL) 160 MG/5ML solution 650 mg (650 mg Oral Given 07/06/23 2346)  labetalol (NORMODYNE) injection 20 mg (20 mg Intravenous Given 07/07/23 0018)  iohexol (OMNIPAQUE) 300 MG/ML solution 100 mL (80 mLs Intravenous Contrast Given 07/07/23 0216)   cefTRIAXone (ROCEPHIN) 1 g in sodium chloride 0.9 % 100 mL IVPB (0 g Intravenous Stopped 07/07/23 0400)  azithromycin (ZITHROMAX) 500 mg in sodium chloride 0.9 % 250 mL IVPB (500 mg Intravenous New Bag/Given 07/07/23 0401)  hydrALAZINE (APRESOLINE) tablet 100 mg (100 mg Oral Given 07/07/23 0328)  cloNIDine (CATAPRES) tablet 0.1 mg (0.1 mg Oral Given 07/07/23 0328)  metoCLOPramide (REGLAN) injection 10 mg (10 mg Intravenous Given 07/07/23 0437)    ED Course/ Medical Decision Making/ A&P                             Medical Decision Making Problems Addressed: Acute cough: acute illness or injury with systemic symptoms Acute febrile illness: acute illness or injury with systemic symptoms that poses a threat to life or bodily functions Dysuria: acute illness or injury Elevated blood pressure reading: acute illness or injury Lesion of pancreas: acute illness or injury Nausea: acute illness or injury Right kidney stone: acute illness or injury Uncontrolled hypertension: chronic illness or injury with exacerbation, progression, or side effects of treatment that poses a threat to life or bodily functions Uterine leiomyoma, unspecified location: chronic illness or injury  Amount and/or Complexity of Data Reviewed Independent Historian: EMS    Details: hx External Data Reviewed: notes. Labs: ordered. Decision-making details documented in ED Course. Radiology: ordered and independent interpretation performed. Decision-making details documented in ED Course.  Risk OTC drugs. Prescription drug management. Decision regarding hospitalization.   Iv ns. Continuous pulse ox and cardiac monitoring. Labs ordered/sent. Imaging ordered.   Differential diagnosis includes uti, pna, covid, viral syndrome, etc. Dispo decision including potential need for admission considered - will get labs and imaging and reassess.   Reviewed nursing notes and prior charts for additional history. External reports  reviewed. Additional history from: EMS  Cardiac monitor: sinus rhythm, rate 90.  Acetaminophen po.   Labs reviewed/interpreted by me - chem normal. Wbc normal. Lactate normal. Ua  w o-5 rbc. Covid neg.   Xrays reviewed/interpreted by me - possible atelectasis vs infiltrate.   CT reviewed/interpreted by me - no def acute process. Recheck pt post ct, abd soft non tender.   Given possible infiltrate on cxr, cough, fever, will tx w abx. Rocephin and zithromax iv.    Pt reports not having had her bp meds today, bp is high, pt given dose of her meds.   Pt is breathing comfortably, o2 sats 98% currently, abd soft non  tender, no emesis. Bp imnproved, and does not require further emergent lowering. Pt currently appears stable for d/c.   Rec close pcp f/u.  Return precautions provided.          Final Clinical Impression(s) / ED Diagnoses Final diagnoses:  Acute febrile illness  Nausea  Elevated blood pressure reading  Uncontrolled hypertension  Acute cough  Dysuria  Lesion of pancreas  Uterine leiomyoma, unspecified location  Right kidney stone    Rx / DC Orders ED Discharge Orders          Ordered    azithromycin (ZITHROMAX Z-PAK) 250 MG tablet  Daily        07/07/23 0348    cefdinir (OMNICEF) 300 MG capsule  2 times daily        07/07/23 0348              Cathren Laine, MD 07/07/23 [phone]

## 2023-07-07 ENCOUNTER — Emergency Department (HOSPITAL_COMMUNITY): Payer: Medicaid Other

## 2023-07-07 ENCOUNTER — Encounter (HOSPITAL_COMMUNITY): Payer: Self-pay

## 2023-07-07 LAB — URINALYSIS, ROUTINE W REFLEX MICROSCOPIC
Bilirubin Urine: NEGATIVE
Glucose, UA: NEGATIVE mg/dL
Hgb urine dipstick: NEGATIVE
Ketones, ur: NEGATIVE mg/dL
Leukocytes,Ua: NEGATIVE
Nitrite: NEGATIVE
Protein, ur: 30 mg/dL — AB
Specific Gravity, Urine: 1.018 (ref 1.005–1.030)
pH: 5 (ref 5.0–8.0)

## 2023-07-07 LAB — COMPREHENSIVE METABOLIC PANEL
ALT: 22 U/L (ref 0–44)
AST: 24 U/L (ref 15–41)
Albumin: 4 g/dL (ref 3.5–5.0)
Alkaline Phosphatase: 107 U/L (ref 38–126)
Anion gap: 6 (ref 5–15)
BUN: 30 mg/dL — ABNORMAL HIGH (ref 6–20)
CO2: 26 mmol/L (ref 22–32)
Calcium: 9.6 mg/dL (ref 8.9–10.3)
Chloride: 112 mmol/L — ABNORMAL HIGH (ref 98–111)
Creatinine, Ser: 1.35 mg/dL — ABNORMAL HIGH (ref 0.44–1.00)
GFR, Estimated: 48 mL/min — ABNORMAL LOW (ref >60)
Glucose, Bld: 119 mg/dL — ABNORMAL HIGH (ref 70–99)
Potassium: 4 mmol/L (ref 3.5–5.1)
Sodium: 144 mmol/L (ref 135–145)
Total Bilirubin: 0.6 mg/dL (ref 0.3–1.2)
Total Protein: 8.2 g/dL — ABNORMAL HIGH (ref 6.5–8.1)

## 2023-07-07 LAB — SARS CORONAVIRUS 2 BY RT PCR: SARS Coronavirus 2 by RT PCR: NEGATIVE

## 2023-07-07 MED ORDER — HYDRALAZINE HCL 25 MG PO TABS
100.0000 mg | ORAL_TABLET | Freq: Once | ORAL | Status: AC
  Administered 2023-07-07: 100 mg via ORAL
  Filled 2023-07-07: qty 4

## 2023-07-07 MED ORDER — IOHEXOL 300 MG/ML  SOLN
100.0000 mL | Freq: Once | INTRAMUSCULAR | Status: AC | PRN
  Administered 2023-07-07: 80 mL via INTRAVENOUS

## 2023-07-07 MED ORDER — SODIUM CHLORIDE 0.9 % IV SOLN
500.0000 mg | Freq: Once | INTRAVENOUS | Status: AC
  Administered 2023-07-07: 500 mg via INTRAVENOUS
  Filled 2023-07-07: qty 5

## 2023-07-07 MED ORDER — SODIUM CHLORIDE (PF) 0.9 % IJ SOLN
INTRAMUSCULAR | Status: AC
  Filled 2023-07-07: qty 50

## 2023-07-07 MED ORDER — SODIUM CHLORIDE 0.9 % IV SOLN
1.0000 g | Freq: Once | INTRAVENOUS | Status: AC
  Administered 2023-07-07: 1 g via INTRAVENOUS
  Filled 2023-07-07: qty 10

## 2023-07-07 MED ORDER — AZITHROMYCIN 250 MG PO TABS: 250.0000 mg | ORAL_TABLET | Freq: Every day | ORAL | 0 refills | Status: AC

## 2023-07-07 MED ORDER — LABETALOL HCL 5 MG/ML IV SOLN
20.0000 mg | Freq: Once | INTRAVENOUS | Status: AC
  Administered 2023-07-07: 20 mg via INTRAVENOUS
  Filled 2023-07-07: qty 4

## 2023-07-07 MED ORDER — CLONIDINE HCL 0.1 MG PO TABS
0.1000 mg | ORAL_TABLET | Freq: Once | ORAL | Status: AC
  Administered 2023-07-07: 0.1 mg via ORAL
  Filled 2023-07-07: qty 1

## 2023-07-07 MED ORDER — METOCLOPRAMIDE HCL 5 MG/ML IJ SOLN
10.0000 mg | Freq: Once | INTRAMUSCULAR | Status: AC
  Administered 2023-07-07: 10 mg via INTRAVENOUS
  Filled 2023-07-07: qty 2

## 2023-07-07 MED ORDER — CEFDINIR 300 MG PO CAPS: 300.0000 mg | ORAL_CAPSULE | Freq: Two times a day (BID) | ORAL | 0 refills | Status: AC

## 2023-07-07 NOTE — Discharge Instructions (Addendum)
It was our pleasure to provide your ER care today - we hope that you feel better. Drink plenty of fluids/stay well hydrated.   Take antibiotics (omnicef and zithromax) as prescribed.   Your blood pressure is high - take your meds as prescribed, and follow up closely with primary care doctor this coming week.   Your CT scan made incidental note of : Hypodense lesion in the tail of the pancreas measuring 16 x 10 mm, indeterminate. Recommend follow-up MRI of the pancreas. Nonobstructing right renal calculi. Fibroid uterus.   Follow up with primary care doctor in the next 1-2 weeks, discuss above CT findings then, and have them arrange follow up imaging of the pancreas in the next 1-2 months.   Return to ER if worse, new symptoms, new/severe pain, chest pain, trouble breathing, new or worsening or severe abdominal pain, persistent vomiting, or other concern.

## 2023-07-07 NOTE — ED Notes (Signed)
Attempted to call rehab facility x2 with no answer.

## 2023-07-07 NOTE — ED Notes (Signed)
[facility] contacted for pt transport.  ?

## 2023-07-07 NOTE — ED Notes (Signed)
Attempted to call rehab facility with no answer.
